# Patient Record
Sex: Male | Born: 1965 | Race: White | Hispanic: No | Marital: Married | Smoking: Current every day smoker
Health system: Southern US, Community
[De-identification: ages and names within clinical notes are randomized; demographics above are authoritative.]

## PROBLEM LIST (undated history)

## (undated) DIAGNOSIS — I480 Paroxysmal atrial fibrillation: Secondary | ICD-10-CM

## (undated) DIAGNOSIS — J45909 Unspecified asthma, uncomplicated: Secondary | ICD-10-CM

## (undated) DIAGNOSIS — K219 Gastro-esophageal reflux disease without esophagitis: Secondary | ICD-10-CM

## (undated) DIAGNOSIS — K529 Noninfective gastroenteritis and colitis, unspecified: Secondary | ICD-10-CM

## (undated) DIAGNOSIS — K760 Fatty (change of) liver, not elsewhere classified: Secondary | ICD-10-CM

## (undated) DIAGNOSIS — M1990 Unspecified osteoarthritis, unspecified site: Secondary | ICD-10-CM

## (undated) DIAGNOSIS — F410 Panic disorder [episodic paroxysmal anxiety] without agoraphobia: Secondary | ICD-10-CM

## (undated) DIAGNOSIS — I2510 Atherosclerotic heart disease of native coronary artery without angina pectoris: Secondary | ICD-10-CM

## (undated) DIAGNOSIS — I499 Cardiac arrhythmia, unspecified: Secondary | ICD-10-CM

## (undated) DIAGNOSIS — R7303 Prediabetes: Secondary | ICD-10-CM

## (undated) DIAGNOSIS — I10 Essential (primary) hypertension: Secondary | ICD-10-CM

## (undated) DIAGNOSIS — F32A Depression, unspecified: Secondary | ICD-10-CM

## (undated) DIAGNOSIS — R918 Other nonspecific abnormal finding of lung field: Secondary | ICD-10-CM

## (undated) DIAGNOSIS — E663 Overweight: Secondary | ICD-10-CM

## (undated) DIAGNOSIS — I639 Cerebral infarction, unspecified: Secondary | ICD-10-CM

## (undated) DIAGNOSIS — G459 Transient cerebral ischemic attack, unspecified: Secondary | ICD-10-CM

## (undated) DIAGNOSIS — Z8719 Personal history of other diseases of the digestive system: Secondary | ICD-10-CM

## (undated) DIAGNOSIS — J449 Chronic obstructive pulmonary disease, unspecified: Secondary | ICD-10-CM

## (undated) DIAGNOSIS — F329 Major depressive disorder, single episode, unspecified: Secondary | ICD-10-CM

## (undated) DIAGNOSIS — Z720 Tobacco use: Secondary | ICD-10-CM

## (undated) DIAGNOSIS — E785 Hyperlipidemia, unspecified: Secondary | ICD-10-CM

## (undated) DIAGNOSIS — E119 Type 2 diabetes mellitus without complications: Secondary | ICD-10-CM

## (undated) HISTORY — DX: Other nonspecific abnormal finding of lung field: R91.8

## (undated) HISTORY — DX: Paroxysmal atrial fibrillation: I48.0

## (undated) HISTORY — DX: Depression, unspecified: F32.A

## (undated) HISTORY — DX: Transient cerebral ischemic attack, unspecified: G45.9

## (undated) HISTORY — DX: Tobacco use: Z72.0

## (undated) HISTORY — DX: Fatty (change of) liver, not elsewhere classified: K76.0

## (undated) HISTORY — DX: Major depressive disorder, single episode, unspecified: F32.9

## (undated) HISTORY — DX: Atherosclerotic heart disease of native coronary artery without angina pectoris: I25.10

## (undated) HISTORY — DX: Chronic obstructive pulmonary disease, unspecified: J44.9

## (undated) HISTORY — DX: Overweight: E66.3

## (undated) HISTORY — DX: Noninfective gastroenteritis and colitis, unspecified: K52.9

## (undated) HISTORY — DX: Hyperlipidemia, unspecified: E78.5

## (undated) HISTORY — DX: Gastro-esophageal reflux disease without esophagitis: K21.9

## (undated) HISTORY — DX: Prediabetes: R73.03

## (undated) HISTORY — DX: Essential (primary) hypertension: I10

## (undated) HISTORY — PX: NECK MASS EXCISION: SHX2079

---

## 1988-07-17 DIAGNOSIS — K529 Noninfective gastroenteritis and colitis, unspecified: Secondary | ICD-10-CM

## 1988-07-17 HISTORY — DX: Noninfective gastroenteritis and colitis, unspecified: K52.9

## 1988-07-17 HISTORY — PX: COLONOSCOPY: SHX174

## 1999-10-11 ENCOUNTER — Inpatient Hospital Stay (HOSPITAL_COMMUNITY): Admission: EM | Admit: 1999-10-11 | Discharge: 1999-10-13 | Payer: Self-pay | Admitting: Cardiology

## 2000-10-08 ENCOUNTER — Ambulatory Visit (HOSPITAL_COMMUNITY): Admission: RE | Admit: 2000-10-08 | Discharge: 2000-10-08 | Payer: Self-pay | Admitting: Urology

## 2000-10-08 ENCOUNTER — Encounter: Payer: Self-pay | Admitting: Urology

## 2001-04-10 ENCOUNTER — Emergency Department (HOSPITAL_COMMUNITY): Admission: EM | Admit: 2001-04-10 | Discharge: 2001-04-10 | Payer: Self-pay | Admitting: *Deleted

## 2001-09-10 ENCOUNTER — Ambulatory Visit (HOSPITAL_COMMUNITY): Admission: RE | Admit: 2001-09-10 | Discharge: 2001-09-10 | Payer: Self-pay | Admitting: Pulmonary Disease

## 2001-09-18 ENCOUNTER — Ambulatory Visit (HOSPITAL_COMMUNITY): Admission: RE | Admit: 2001-09-18 | Discharge: 2001-09-18 | Payer: Self-pay | Admitting: Pulmonary Disease

## 2001-10-02 ENCOUNTER — Ambulatory Visit (HOSPITAL_COMMUNITY): Admission: RE | Admit: 2001-10-02 | Discharge: 2001-10-02 | Payer: Self-pay | Admitting: Internal Medicine

## 2001-12-16 ENCOUNTER — Ambulatory Visit (HOSPITAL_COMMUNITY): Admission: RE | Admit: 2001-12-16 | Discharge: 2001-12-16 | Payer: Self-pay | Admitting: Pulmonary Disease

## 2002-04-05 ENCOUNTER — Encounter: Payer: Self-pay | Admitting: Emergency Medicine

## 2002-04-05 ENCOUNTER — Emergency Department (HOSPITAL_COMMUNITY): Admission: EM | Admit: 2002-04-05 | Discharge: 2002-04-06 | Payer: Self-pay | Admitting: Emergency Medicine

## 2002-05-10 ENCOUNTER — Encounter: Payer: Self-pay | Admitting: *Deleted

## 2002-05-10 ENCOUNTER — Emergency Department (HOSPITAL_COMMUNITY): Admission: EM | Admit: 2002-05-10 | Discharge: 2002-05-10 | Payer: Self-pay | Admitting: *Deleted

## 2002-07-04 ENCOUNTER — Emergency Department (HOSPITAL_COMMUNITY): Admission: EM | Admit: 2002-07-04 | Discharge: 2002-07-04 | Payer: Self-pay | Admitting: Emergency Medicine

## 2002-07-04 ENCOUNTER — Encounter: Payer: Self-pay | Admitting: Emergency Medicine

## 2002-07-30 ENCOUNTER — Ambulatory Visit (HOSPITAL_COMMUNITY): Admission: RE | Admit: 2002-07-30 | Discharge: 2002-07-30 | Payer: Self-pay | Admitting: Internal Medicine

## 2002-07-30 ENCOUNTER — Encounter (INDEPENDENT_AMBULATORY_CARE_PROVIDER_SITE_OTHER): Payer: Self-pay | Admitting: Internal Medicine

## 2002-08-20 ENCOUNTER — Encounter (INDEPENDENT_AMBULATORY_CARE_PROVIDER_SITE_OTHER): Payer: Self-pay | Admitting: Internal Medicine

## 2002-08-20 ENCOUNTER — Ambulatory Visit (HOSPITAL_COMMUNITY): Admission: RE | Admit: 2002-08-20 | Discharge: 2002-08-20 | Payer: Self-pay | Admitting: Internal Medicine

## 2002-10-01 ENCOUNTER — Encounter: Payer: Self-pay | Admitting: *Deleted

## 2002-10-01 ENCOUNTER — Encounter (HOSPITAL_COMMUNITY): Admission: RE | Admit: 2002-10-01 | Discharge: 2002-10-31 | Payer: Self-pay | Admitting: *Deleted

## 2002-12-24 ENCOUNTER — Emergency Department (HOSPITAL_COMMUNITY): Admission: EM | Admit: 2002-12-24 | Discharge: 2002-12-24 | Payer: Self-pay | Admitting: Emergency Medicine

## 2003-04-17 ENCOUNTER — Emergency Department (HOSPITAL_COMMUNITY): Admission: EM | Admit: 2003-04-17 | Discharge: 2003-04-17 | Payer: Self-pay | Admitting: Emergency Medicine

## 2003-06-16 ENCOUNTER — Emergency Department (HOSPITAL_COMMUNITY): Admission: EM | Admit: 2003-06-16 | Discharge: 2003-06-16 | Payer: Self-pay | Admitting: Emergency Medicine

## 2003-10-27 ENCOUNTER — Inpatient Hospital Stay (HOSPITAL_COMMUNITY): Admission: EM | Admit: 2003-10-27 | Discharge: 2003-10-29 | Payer: Self-pay | Admitting: *Deleted

## 2003-12-03 ENCOUNTER — Ambulatory Visit (HOSPITAL_COMMUNITY): Admission: RE | Admit: 2003-12-03 | Discharge: 2003-12-03 | Payer: Self-pay | Admitting: Internal Medicine

## 2004-02-03 ENCOUNTER — Ambulatory Visit (HOSPITAL_COMMUNITY): Admission: RE | Admit: 2004-02-03 | Discharge: 2004-02-03 | Payer: Self-pay | Admitting: Pulmonary Disease

## 2004-02-10 ENCOUNTER — Ambulatory Visit (HOSPITAL_COMMUNITY): Admission: RE | Admit: 2004-02-10 | Discharge: 2004-02-10 | Payer: Self-pay | Admitting: Pulmonary Disease

## 2004-03-04 ENCOUNTER — Ambulatory Visit (HOSPITAL_COMMUNITY): Admission: RE | Admit: 2004-03-04 | Discharge: 2004-03-04 | Payer: Self-pay | Admitting: Pulmonary Disease

## 2004-04-13 ENCOUNTER — Ambulatory Visit (HOSPITAL_COMMUNITY): Admission: RE | Admit: 2004-04-13 | Discharge: 2004-04-13 | Payer: Self-pay | Admitting: Pulmonary Disease

## 2004-06-23 ENCOUNTER — Emergency Department (HOSPITAL_COMMUNITY): Admission: EM | Admit: 2004-06-23 | Discharge: 2004-06-23 | Payer: Self-pay | Admitting: Emergency Medicine

## 2004-07-11 ENCOUNTER — Emergency Department (HOSPITAL_COMMUNITY): Admission: EM | Admit: 2004-07-11 | Discharge: 2004-07-11 | Payer: Self-pay | Admitting: Emergency Medicine

## 2004-07-17 HISTORY — PX: SHOULDER ARTHROSCOPY W/ ROTATOR CUFF REPAIR: SHX2400

## 2004-07-28 ENCOUNTER — Emergency Department (HOSPITAL_COMMUNITY): Admission: EM | Admit: 2004-07-28 | Discharge: 2004-07-28 | Payer: Self-pay | Admitting: *Deleted

## 2005-05-12 ENCOUNTER — Ambulatory Visit (HOSPITAL_COMMUNITY): Admission: RE | Admit: 2005-05-12 | Discharge: 2005-05-12 | Payer: Self-pay | Admitting: Pulmonary Disease

## 2006-02-26 ENCOUNTER — Ambulatory Visit: Payer: Self-pay | Admitting: Cardiology

## 2006-05-16 ENCOUNTER — Ambulatory Visit (HOSPITAL_COMMUNITY): Admission: RE | Admit: 2006-05-16 | Discharge: 2006-05-16 | Payer: Self-pay | Admitting: Orthopedic Surgery

## 2006-06-19 ENCOUNTER — Ambulatory Visit: Payer: Self-pay | Admitting: Internal Medicine

## 2006-11-11 ENCOUNTER — Ambulatory Visit: Payer: Self-pay | Admitting: Internal Medicine

## 2006-11-11 ENCOUNTER — Inpatient Hospital Stay (HOSPITAL_COMMUNITY): Admission: EM | Admit: 2006-11-11 | Discharge: 2006-11-12 | Payer: Self-pay | Admitting: Emergency Medicine

## 2006-11-12 ENCOUNTER — Ambulatory Visit: Payer: Self-pay | Admitting: Internal Medicine

## 2006-12-19 ENCOUNTER — Ambulatory Visit: Payer: Self-pay | Admitting: Internal Medicine

## 2007-03-08 ENCOUNTER — Ambulatory Visit: Payer: Self-pay | Admitting: Cardiology

## 2007-12-02 ENCOUNTER — Ambulatory Visit: Payer: Self-pay | Admitting: Gastroenterology

## 2007-12-29 ENCOUNTER — Emergency Department (HOSPITAL_COMMUNITY): Admission: EM | Admit: 2007-12-29 | Discharge: 2007-12-29 | Payer: Self-pay | Admitting: Emergency Medicine

## 2008-01-26 ENCOUNTER — Ambulatory Visit: Payer: Self-pay | Admitting: Cardiology

## 2008-01-26 ENCOUNTER — Inpatient Hospital Stay (HOSPITAL_COMMUNITY): Admission: AD | Admit: 2008-01-26 | Discharge: 2008-01-27 | Payer: Self-pay | Admitting: Cardiology

## 2008-01-26 ENCOUNTER — Encounter: Payer: Self-pay | Admitting: Emergency Medicine

## 2008-02-14 ENCOUNTER — Ambulatory Visit: Payer: Self-pay | Admitting: Cardiology

## 2008-02-14 ENCOUNTER — Encounter (HOSPITAL_COMMUNITY): Admission: RE | Admit: 2008-02-14 | Discharge: 2008-03-15 | Payer: Self-pay | Admitting: Cardiology

## 2008-03-11 ENCOUNTER — Ambulatory Visit: Payer: Self-pay | Admitting: Cardiology

## 2008-05-04 ENCOUNTER — Ambulatory Visit: Payer: Self-pay | Admitting: Gastroenterology

## 2008-06-23 ENCOUNTER — Ambulatory Visit: Payer: Self-pay | Admitting: Gastroenterology

## 2008-07-01 ENCOUNTER — Emergency Department (HOSPITAL_COMMUNITY): Admission: EM | Admit: 2008-07-01 | Discharge: 2008-07-02 | Payer: Self-pay | Admitting: Emergency Medicine

## 2008-10-27 ENCOUNTER — Ambulatory Visit: Payer: Self-pay | Admitting: Cardiology

## 2008-11-16 ENCOUNTER — Emergency Department (HOSPITAL_COMMUNITY): Admission: EM | Admit: 2008-11-16 | Discharge: 2008-11-16 | Payer: Self-pay | Admitting: Emergency Medicine

## 2009-01-22 ENCOUNTER — Telehealth (INDEPENDENT_AMBULATORY_CARE_PROVIDER_SITE_OTHER): Payer: Self-pay

## 2009-03-03 ENCOUNTER — Encounter: Payer: Self-pay | Admitting: Gastroenterology

## 2009-04-05 DIAGNOSIS — E669 Obesity, unspecified: Secondary | ICD-10-CM

## 2009-04-05 DIAGNOSIS — K760 Fatty (change of) liver, not elsewhere classified: Secondary | ICD-10-CM

## 2009-04-05 DIAGNOSIS — K219 Gastro-esophageal reflux disease without esophagitis: Secondary | ICD-10-CM | POA: Insufficient documentation

## 2009-04-26 ENCOUNTER — Encounter (INDEPENDENT_AMBULATORY_CARE_PROVIDER_SITE_OTHER): Payer: Self-pay | Admitting: *Deleted

## 2009-04-26 ENCOUNTER — Telehealth: Payer: Self-pay | Admitting: Cardiology

## 2009-05-20 ENCOUNTER — Ambulatory Visit: Payer: Self-pay | Admitting: Gastroenterology

## 2009-05-27 ENCOUNTER — Encounter: Payer: Self-pay | Admitting: Internal Medicine

## 2009-05-27 ENCOUNTER — Encounter: Payer: Self-pay | Admitting: Gastroenterology

## 2009-06-01 LAB — CONVERTED CEMR LAB
ALT: 52 units/L (ref 0–53)
AST: 24 units/L (ref 0–37)
Albumin: 4.2 g/dL (ref 3.5–5.2)
Alkaline Phosphatase: 54 units/L (ref 39–117)
BUN: 18 mg/dL (ref 6–23)
Basophils Absolute: 0 10*3/uL (ref 0.0–0.1)
Basophils Relative: 0 % (ref 0–1)
CO2: 22 meq/L (ref 19–32)
Calcium: 9.4 mg/dL (ref 8.4–10.5)
Chloride: 102 meq/L (ref 96–112)
Creatinine, Ser: 1.01 mg/dL (ref 0.40–1.50)
Eosinophils Absolute: 0.1 10*3/uL (ref 0.0–0.7)
Eosinophils Relative: 2 % (ref 0–5)
Glucose, Bld: 188 mg/dL — ABNORMAL HIGH (ref 70–99)
HCT: 48.2 % (ref 39.0–52.0)
Hemoglobin: 16.2 g/dL (ref 13.0–17.0)
Lymphocytes Relative: 34 % (ref 12–46)
Lymphs Abs: 2.3 10*3/uL (ref 0.7–4.0)
MCHC: 33.6 g/dL (ref 30.0–36.0)
MCV: 92 fL (ref 78.0–100.0)
Monocytes Absolute: 0.5 10*3/uL (ref 0.1–1.0)
Monocytes Relative: 7 % (ref 3–12)
Neutro Abs: 3.9 10*3/uL (ref 1.7–7.7)
Neutrophils Relative %: 57 % (ref 43–77)
Platelets: 224 10*3/uL (ref 150–400)
Potassium: 4.4 meq/L (ref 3.5–5.3)
RBC: 5.24 M/uL (ref 4.22–5.81)
RDW: 12.9 % (ref 11.5–15.5)
Sodium: 139 meq/L (ref 135–145)
TSH: 1.402 microintl units/mL (ref 0.350–4.500)
Total Bilirubin: 0.3 mg/dL (ref 0.3–1.2)
Total Protein: 6.4 g/dL (ref 6.0–8.3)
WBC: 6.9 10*3/uL (ref 4.0–10.5)

## 2009-06-08 ENCOUNTER — Ambulatory Visit (HOSPITAL_COMMUNITY): Admission: RE | Admit: 2009-06-08 | Discharge: 2009-06-08 | Payer: Self-pay | Admitting: Internal Medicine

## 2009-06-29 ENCOUNTER — Ambulatory Visit: Payer: Self-pay | Admitting: Internal Medicine

## 2009-06-30 ENCOUNTER — Encounter: Payer: Self-pay | Admitting: Internal Medicine

## 2009-08-04 ENCOUNTER — Encounter (HOSPITAL_COMMUNITY): Admission: RE | Admit: 2009-08-04 | Discharge: 2009-09-03 | Payer: Self-pay | Admitting: Internal Medicine

## 2009-10-18 ENCOUNTER — Ambulatory Visit: Payer: Self-pay | Admitting: Gastroenterology

## 2009-11-25 ENCOUNTER — Ambulatory Visit: Payer: Self-pay | Admitting: Cardiology

## 2009-11-25 DIAGNOSIS — E785 Hyperlipidemia, unspecified: Secondary | ICD-10-CM

## 2009-11-25 DIAGNOSIS — E782 Mixed hyperlipidemia: Secondary | ICD-10-CM | POA: Insufficient documentation

## 2009-11-26 ENCOUNTER — Encounter: Payer: Self-pay | Admitting: Adult Health

## 2009-11-29 ENCOUNTER — Encounter (INDEPENDENT_AMBULATORY_CARE_PROVIDER_SITE_OTHER): Payer: Self-pay | Admitting: *Deleted

## 2009-11-29 LAB — CONVERTED CEMR LAB
BUN: 17 mg/dL
CO2: 28 meq/L (ref 19–32)
Calcium: 9.4 mg/dL (ref 8.4–10.5)
Chloride: 101 meq/L
Creatinine, Ser: 1.05 mg/dL
Creatinine, Ser: 1.05 mg/dL (ref 0.40–1.50)
Glucose, Bld: 195 mg/dL — ABNORMAL HIGH (ref 70–99)
HDL: 36 mg/dL
Hgb A1c MFr Bld: 8.6 %
Hgb A1c MFr Bld: 8.6 % — ABNORMAL HIGH (ref <5.7)
Triglycerides: 137 mg/dL

## 2009-11-30 ENCOUNTER — Telehealth (INDEPENDENT_AMBULATORY_CARE_PROVIDER_SITE_OTHER): Payer: Self-pay

## 2010-02-25 ENCOUNTER — Encounter (INDEPENDENT_AMBULATORY_CARE_PROVIDER_SITE_OTHER): Payer: Self-pay | Admitting: *Deleted

## 2010-03-02 ENCOUNTER — Ambulatory Visit: Payer: Self-pay | Admitting: Cardiology

## 2010-04-28 ENCOUNTER — Encounter (INDEPENDENT_AMBULATORY_CARE_PROVIDER_SITE_OTHER): Payer: Self-pay | Admitting: *Deleted

## 2010-06-06 ENCOUNTER — Ambulatory Visit: Payer: Self-pay | Admitting: Cardiology

## 2010-06-06 ENCOUNTER — Encounter: Payer: Self-pay | Admitting: Adult Health

## 2010-06-14 ENCOUNTER — Encounter: Payer: Self-pay | Admitting: Adult Health

## 2010-06-14 ENCOUNTER — Ambulatory Visit: Payer: Self-pay | Admitting: Cardiology

## 2010-06-20 ENCOUNTER — Encounter (INDEPENDENT_AMBULATORY_CARE_PROVIDER_SITE_OTHER): Payer: Self-pay | Admitting: *Deleted

## 2010-06-20 LAB — CONVERTED CEMR LAB
Albumin: 4.5 g/dL
Alkaline Phosphatase: 63 units/L
BUN: 16 mg/dL
CO2: 32 meq/L
Calcium: 9.3 mg/dL
Chloride: 99 meq/L
Glucose, Bld: 198 mg/dL
Potassium: 4.4 meq/L
Total Protein: 6.7 g/dL
Triglycerides: 137 mg/dL

## 2010-06-21 LAB — CONVERTED CEMR LAB
ALT: 67 units/L — ABNORMAL HIGH (ref 0–53)
AST: 43 units/L — ABNORMAL HIGH (ref 0–37)
BUN: 16 mg/dL (ref 6–23)
Calcium: 9.3 mg/dL (ref 8.4–10.5)
Cholesterol: 175 mg/dL (ref 0–200)
Indirect Bilirubin: 0.5 mg/dL (ref 0.0–0.9)
Potassium: 4.4 meq/L (ref 3.5–5.3)
Sodium: 139 meq/L (ref 135–145)
Total Protein: 6.7 g/dL (ref 6.0–8.3)
Triglycerides: 137 mg/dL (ref <150)
VLDL: 27 mg/dL (ref 0–40)

## 2010-07-06 ENCOUNTER — Encounter: Payer: Self-pay | Admitting: Adult Health

## 2010-07-06 ENCOUNTER — Ambulatory Visit: Payer: Self-pay | Admitting: Cardiology

## 2010-07-13 ENCOUNTER — Telehealth (INDEPENDENT_AMBULATORY_CARE_PROVIDER_SITE_OTHER): Payer: Self-pay

## 2010-08-04 ENCOUNTER — Encounter (INDEPENDENT_AMBULATORY_CARE_PROVIDER_SITE_OTHER): Payer: Self-pay | Admitting: *Deleted

## 2010-08-06 ENCOUNTER — Encounter: Payer: Self-pay | Admitting: Pulmonary Disease

## 2010-08-07 ENCOUNTER — Encounter: Payer: Self-pay | Admitting: Internal Medicine

## 2010-08-08 ENCOUNTER — Encounter: Payer: Self-pay | Admitting: Cardiology

## 2010-08-18 NOTE — Assessment & Plan Note (Addendum)
Summary: GERD   Visit Type:  Follow-up Visit Primary Care Provider:  Juanetta Gosling, M.D.  Chief Complaint:  follow up- gerd.  History of Present Illness: Seen & eva;uated in 2003 for RUQ ABD PAIN. dX-GERD/IBS, 207 LBS, ABD U/S:CT ABD/PELVIS-fatty liver, EGD/eso bx: EROSIVE ESOPHAGITIS, HPYLORI SEROLOGY NEG. 2004: RUQ PAIN? MS, 203 LBS, SBFT-slightly delayed SB transit, diverticula in the cecum, CTABD/PELVIS-ABD U/S: fatty liver, 2005: RUQ PAIN & GERD, 218 LBS, NL HFP, ABD U/S-FATTY LIVER, no gallstones, rX: NEXIUM two times a day, 2007: 193 LBS, 2008: 217 LBS: CHEST PAIN, rX: NEXIUM. 2009: 225-233 LBS, ALT 59, AST 27, ALB 3.8  2010: Seen and evaluated by [facility] for RUQ pain. ABD  US/CT/HIDA showed no gallstones or cholecystitis. Pain was having has eased up. Can feel a flutter is his esophagus. Feels like  needs to cough and can't breath. Still smoking but not as much. weight unchanged since NOV 2009. Got a water bed elevated but he has a block is 3 inches. Drinking Mt. Dew-1 case a day, then 4 ls a day. Now Caffeine free Mt. Dew 1L a day.   Current Medications (verified): 1)  Pravastatin Sodium 20 Mg Tabs (Pravastatin Sodium) .... Take One Tablet By Mouth Daily At Bedtime 2)  Metoprolol Tartrate 75mg  Tabs (Metoprolol Tartrate) .... Take 1 Tablet By Mouth Once A Day 3)  Aspirin 325 Mg Tabs (Aspirin) .... Take 1 Tablet By Mouth Once A Day 4)  Tylenol 325 Mg Tabs (Acetaminophen) .... Take As Needed 5)  Nexium 40 Mg Cpdr (Esomeprazole Magnesium) .... Take 1 Capsule By Mouth Two Times A Day 6)  Vicodin Es 7.5-750 Mg Tabs (Hydrocodone-Acetaminophen) .... Take As Needed 7)  Muscle Relaxer .... Take As Needed 8)  Xanax 0.5 Mg Tabs (Alprazolam) .... Take As Needed 9)  Tums 500 Mg Chew (Calcium Carbonate Antacid) .... Take As Needed  Allergies (verified): 1)  ! Pcn 2)  ! * Mushrooms  Past History:  Past Medical History: HIATAL  HERNIA COPD GERD Hyperlipidemia Hypertension Obesity DEPRESSION TOBACCO ABUSE 1990: COLITIS, FX SIG-NL COLON  Review of Systems       2009:  Negative pharmacologic stress nuclear myocardial study 2003: 207 LBS 1995: 186 LBS 1990: 166 lbs,   Vital Signs:  Patient profile:   45 year old male Height:      71 inches Weight:      235 pounds BMI:     32.89 Temp:     97.6 degrees F oral Pulse rate:   76 / minute BP sitting:   132 / 82  (left arm) Cuff size:   regular  Vitals Entered By: Hendricks Limes LPN (October 18, 1608 2:54 PM)  Physical Exam  General:  Well developed, well nourished, no acute distress. Head:  Normocephalic and atraumatic. Eyes:  PERRLA, no icterus. Mouth:  No deformity or lesions. Lungs:  Clear throughout to auscultation. Heart:  Regular rate and rhythm; no murmurs. Abdomen:  Soft, nontender and nondistended. Normal bowel sounds. obese.   Extremities:  No edema noted. Neurologic:  Alert and  oriented x4;  grossly normal neurologically.  Impression & Recommendations:  Problem # 1:  GASTROESOPHAGEAL REFLUX DISEASE (ICD-530.81) Discussed with pt and wife he needs to follow lifestyle recommendations: stop smoking, lose weight, avoid carbonated beverages, and low fat diet. Explained medicines can only do so much. Continue Nexium. Follow low fat diet.   TIME SPENT: 15 MINUTES  CC: PCP  Patient Instructions: 1)  FOLLOW A LOW FAT DIET. 2)  FOLLOW REFLUX RECOMMENDATIONS. 3)  Lose down ot 215 lbs. 4)  Exercise 3-4 times a week. Jog 30 secs four times in 1 mile. 5)  Contiue Nexium. 6)  Return visit in 6 mos. 7)  The medication list was reviewed and reconciled.  All changed / newly prescribed medications were explained.  A complete medication list was provided to the patient / caregiver.  Appended Document: Orders Update    Clinical Lists Changes  Orders: Added new Service order of Est. Patient Level III (82956) - Signed      Appended Document:  GERD    Prescriptions: NEXIUM 40 MG CPDR (ESOMEPRAZOLE MAGNESIUM) Take 1 capsule by mouth two times a day  #60 x 5   Entered and Authorized by:   Joselyn Arrow FNP-BC   Signed by:   Joselyn Arrow FNP-BC on 08/02/2010   Method used:   Electronically to        [facility]  [address] (retail)       [address]       [place], [place]  [address]       Ph: [phone]       Fax: [phone]   RxID:   [id number]  PT NEEDS OV   Appended Document: GERD mailed letter to pt to call us to set up ov to further refills

## 2010-08-18 NOTE — Assessment & Plan Note (Signed)
Summary: F1M      Allergies Added:   Visit Type:  Follow-up Primary Provider:  Juanetta Gosling, M.D.   History of Present Illness: Richard [name] is a 45 y/o CM with known history of CAD, nonobstructive per cardiac catherization 2007; follow-up stress echo 2010 negative for ischemia, diet controlled diabetes, hypercholesterolemia.  He is here for follow-up after having episode of shortness of breath while stooping at work. He also has some mild chest pressure intermittiantly.  He is a heavy Mt. Dew drinker, 12 or more a day, he continues to smoke.  He has not had to take NTG for any chest discomfort, but is noticing his energy level is worsening.  Otherwise he is without complaint.  Preventive Screening-Counseling & Management  Alcohol-Tobacco     Alcohol drinks/day: <1     Smoking Status: current     Smoking Cessation Counseling: yes     Smoke Cessation Stage: contemplative     Packs/Day: 1.5  Caffeine-Diet-Exercise     Caffeine use/day: 12 or more cans of Mt. Dew     Caffeine Counseling: decrease use of caffeine     Diet Comments: Fast food     Diet Counseling: to improve diet; diet is suboptimal     Does Patient Exercise: no     Exercise Counseling: to improve exercise regimen  Current Medications (verified): 1)  Metoprolol Succinate 50 Mg Xr24h-Tab (Metoprolol Succinate) .... Take 1 and 1/2 Tablet By Mouth Once Daily 2)  Aspirin 325 Mg Tabs (Aspirin) .... Take 1 Tablet By Mouth Once A Day 3)  Tylenol 325 Mg Tabs (Acetaminophen) .... Take As Needed 4)  Nexium 40 Mg Cpdr (Esomeprazole Magnesium) .... Take 1 Capsule By Mouth Two Times A Day 5)  Vicodin Es 7.5-750 Mg Tabs (Hydrocodone-Acetaminophen) .... Take As Needed 6)  Robaxin 500 Mg Tabs (Methocarbamol) .... Prn 7)  Xanax 0.5 Mg Tabs (Alprazolam) .... Take As Needed 8)  Tums 500 Mg Chew (Calcium Carbonate Antacid) .... Take As Needed 9)  Pravachol 40 Mg Tabs (Pravastatin Sodium) .... Take 1 Tablet By Mouth At Bedtime 10)  Lisinopril  5 Mg Tabs (Lisinopril) .... Take 1 Tabley By Mouth Once Daily 11)  Hydrochlorothiazide 25 Mg Tabs (Hydrochlorothiazide) .... Take 1/2 Tablet By Mouth Once Daily  Allergies (verified): 1)  ! Pcn 2)  ! * Mushrooms  Comments:  Nurse/Medical Assistant: patient and i reviewed med list from previous ov and stated all meds are correct patients pharmacy is [facility] [place]  Social History: Packs/Day:  1.5 Alcohol drinks/day:  <1 Caffeine use/day:  12 or more cans of Mt. Dew Diet Comments:  Fast food Does Patient Exercise:  no  Review of Systems       All other systems have been reviewed and are negative unless stated above.   Vital Signs:  Patient profile:   45 year old male Weight:      220 pounds O2 Sat:      96 % on Room air Pulse rate:   66 / minute BP sitting:   147 / 82  (right arm)  Vitals Entered By: Dreama Saa, CNA (July 06, 2010 11:01 AM)  O2 Flow:  Room air  Physical Exam  General:  Well developed, well nourished, in no acute distress. Mouth:  poor dentition.   Lungs:  Mild crackels.  Smells of cigarette smoke. Heart:  Non-displaced PMI, chest non-tender; regular rate and rhythm, S1, S2 without murmurs, rubs or gallops. Carotid upstroke normal, no bruit. Normal abdominal aortic size,  no bruits. Femorals normal pulses, no bruits. Pedals normal pulses. No edema, no varicosities. Abdomen:  Central obesity Normal bowel sounds Msk:  Back normal, normal gait. Muscle strength and tone normal. Pulses:  pulses normal in all 4 extremities Extremities:  No clubbing or cyanosis. Neurologic:  Alert and oriented x 3. Psych:  Normal affect.   Impression & Recommendations:  Problem # 1:  CORONARY ATHEROSCLEROSIS NATIVE CORONARY ARTERY (ICD-414.01) He is generally without complaints.  I believe his breathing issue when he is stooping is related to central obesity and continued smoking.  Once he stands he feels better.  I have advised wt loss and increased exercise to  walk 30 minutes every day.  He states that when he was at 165lbs he did not have these problems and agrees to adhere to my suggestions. His updated medication list for this problem includes:    Metoprolol Succinate 50 Mg Xr24h-tab (Metoprolol succinate) .[name]... Take 1 and 1/2 tablet by mouth once daily    Aspirin 325 Mg Tabs (Aspirin) .[name]... Take 1 tablet by mouth once a day    Lisinopril 5 Mg Tabs (Lisinopril) .[name]... Take 1 tabley by mouth once daily  Problem # 2:  HYPERLIPIDEMIA (ICD-272.4) Reivew of his latest labs dated 06/20/2010 TC 175;TG-137; HDL 37; LDL 111.  He is to continue on pravachol.  Low choesterol diet is provided for him. His updated medication list for this problem includes:    Pravachol 40 Mg Tabs (Pravastatin sodium) .[name]... Take 1 tablet by mouth at bedtime  Problem # 3:  WEIGHT GAIN (ICD-783.1) He is drinking a lot of sugary Mt Dews a day- 12 or more.  I have advised that this should be drastically cut down.  I am concerned about metabolic syndrome and have advised him to follow up with his primary physician for diabetic evaluation.  He has a history of hyperglycemia in the past, but is on no medications at this time.  Fasting blood glucose is 198 on recent labs.  Problem # 4:  NICOTINE ADDICTION (ICD-305.1) He is advised to cut down and eventually quit.  He feels wt lose, and decreasing caffine will be enough and it will be too difficult to add smoking cessation to his list. He is willing to try to cut down.  Patient Instructions: 1)  Your physician recommends that you schedule a follow-up appointment in: 6 months 2)  Your physician recommends that you continue on your current medications as directed. Please refer to the Current Medication list given to you today. 3)  ***Stop drinking Mt. Dew*** 4)  Your physician discussed the importance of regular exercise and recommended that you start or continue a regular exercise program for good health. ***Please attempt to walk .  per day once daily*** 5)  ***Start a low cholesterol diet, see handout that was given to you today.

## 2010-08-18 NOTE — Letter (Signed)
Summary: Recall Office Visit  [facility]  [address]   [place], [place] [address]   Phone: [phone]  Fax: [phone]      August 04, 2010   Richard [name] [address] [place], [place]  [address] 1966/05/21   Dear [name],   According to our records, it is time for you to schedule a follow-up office visit with [name].   At your convenience, please call [phone] to schedule an office visit. If you have any questions, concerns, or feel that this letter is in error, we would appreciate your call.   Sincerely,    Diana Eves  [facility] Ph: [phone]   Fax: [phone]

## 2010-08-18 NOTE — Letter (Signed)
Summary: BP LOG  BP LOG   Imported By: Faythe Ghee 06/14/2010 14:03:40  _____________________________________________________________________  External Attachment:    Type:   Image     Comment:   External Document

## 2010-08-18 NOTE — Assessment & Plan Note (Signed)
Summary: f63m      Allergies Added:   Visit Type:  Follow-up Primary Gleen Ripberger:  Juanetta Gosling, M.D.  CC:  SOB IN HEAT.  History of Present Illness: Mr. Gwinner is here for 6 months follow-up with known history of hypertension, chronic back pain, hypercholesterolemia, and ongoing tobacco abuse.  He had a negative stress myoview in 08/09.  He denies complaints of recurrent chest pain, but continues chronic back pain.  He continues to smoke, but is trying to cut down. No new problems on this visit.  Preventive Screening-Counseling & Management  Alcohol-Tobacco     Smoking Status: current     Smoking Cessation Counseling: yes     Smoke Cessation Stage: precontemplative     Packs/Day: 0.75  Current Medications (verified): 1)  Metoprolol Succinate 50 Mg Xr24h-Tab (Metoprolol Succinate) .... Take 1 and 1/2 Tablet By Mouth Once Daily 2)  Aspirin 325 Mg Tabs (Aspirin) .... Take 1 Tablet By Mouth Once A Day 3)  Tylenol 325 Mg Tabs (Acetaminophen) .... Take As Needed 4)  Nexium 40 Mg Cpdr (Esomeprazole Magnesium) .... Take 1 Capsule By Mouth Two Times A Day 5)  Vicodin Es 7.5-750 Mg Tabs (Hydrocodone-Acetaminophen) .... Take As Needed 6)  Robaxin 500 Mg Tabs (Methocarbamol) .... Prn 7)  Xanax 0.5 Mg Tabs (Alprazolam) .... Take As Needed 8)  Tums 500 Mg Chew (Calcium Carbonate Antacid) .... Take As Needed 9)  Pravachol 20 Mg Tabs (Pravastatin Sodium) .... Take 1 Tab By Mouth Once Daily  Allergies (verified): 1)  ! Pcn 2)  ! * Mushrooms  Past History:  Past medical, surgical, family and social histories (including risk factors) reviewed, and no changes noted (except as noted below).  Past Medical History: Reviewed history from 10/18/2009 and no changes required. HIATAL HERNIA COPD GERD Hyperlipidemia Hypertension Obesity DEPRESSION TOBACCO ABUSE 1990: COLITIS, FX SIG-NL COLON  Past Surgical History: Reviewed history from 06/29/2009 and no changes required. Right shoulder  surgery Heart catherization  2005 ? surgery on knot on neck as a child  Family History: Reviewed history from 06/29/2009 and no changes required. Father: alive- heart, dm Mother: deceased Siblings: 5 brothers, 2 sisters No FH of Colon Cancer:  Social History: Reviewed history from 06/29/2009 and no changes required. Marital Status: Married Children: 5 Occupation: disability Patient currently smokes.  Alcohol Use - no Packs/Day:  0.75  Review of Systems       Chronic back pain  All other systems have been reviewed and are negative unless stated above.   Vital Signs:  Patient profile:   45 year old male Weight:      227 pounds Pulse rate:   69 / minute BP sitting:   144 / 80  (right arm)  Vitals Entered By: Dreama Saa, CNA (March 02, 2010 2:49 PM)  Physical Exam  General:  Well developed, well nourished, in no acute distress. Lungs:  Clear bilaterally to auscultation and percussion. Heart:  Non-displaced PMI, chest non-tender; regular rate and rhythm, S1, S2 without murmurs, rubs or gallops. Carotid upstroke normal, no bruit. Normal abdominal aortic size, no bruits. Femorals normal pulses, no bruits. Pedals normal pulses. No edema, no varicosities. Abdomen:  Bowel sounds positive; abdomen soft and non-tender without masses, organomegaly, or hernias noted. No hepatosplenomegaly. Msk:  Back pain with movement Pulses:  pulses normal in all 4 extremities Extremities:  No clubbing or cyanosis. Psych:  Normal affect.   Impression & Recommendations:  Problem # 1:  CORONARY ATHEROSCLEROSIS NATIVE CORONARY ARTERY (ICD-414.01) Stable from  CV standpoint.  He is compliant with medications.  No symptoms.  Will see him in i year His updated medication list for this problem includes:    Metoprolol Succinate 50 Mg Xr24h-tab (Metoprolol succinate) .[name]... Take 1 and 1/2 tablet by mouth once daily    Aspirin 325 Mg Tabs (Aspirin) .[name]... Take 1 tablet by mouth once a day  Problem #  2:  NICOTINE ADDICTION (ICD-305.1) He has no plans to quit at this time.  His shortness of breath is related to tobacco use and I have counseled him on cessation and CVRF.  He states he is under a lot of pressure right now.  He is unemployed and money issues are weighing on him.  He says he will try to cut down. He also requests Vicodin refill. I have advised him to seek medication from primary care physician.  Patient Instructions: 1)  Your physician recommends that you schedule a follow-up appointment in: 1 year

## 2010-08-18 NOTE — Assessment & Plan Note (Signed)
Summary: nurse visit  Nurse Visit   Vital Signs:  Patient profile:   45 year old male Height:      71 inches Weight:      221 pounds O2 Sat:      98 % on Room air Temp:     97.7 degrees F oral Pulse rate:   71 / minute BP sitting:   144 / 88  (left arm)  Vitals Entered By: Teressa Lower RN (June 14, 2010 9:34 AM)  O2 Flow:  Room air  Current Medications (verified): 1)  Metoprolol Succinate 50 Mg Xr24h-Tab (Metoprolol Succinate) .... Take 1 and 1/2 Tablet By Mouth Once Daily 2)  Aspirin 325 Mg Tabs (Aspirin) .... Take 1 Tablet By Mouth Once A Day 3)  Tylenol 325 Mg Tabs (Acetaminophen) .... Take As Needed 4)  Nexium 40 Mg Cpdr (Esomeprazole Magnesium) .... Take 1 Capsule By Mouth Two Times A Day 5)  Vicodin Es 7.5-750 Mg Tabs (Hydrocodone-Acetaminophen) .... Take As Needed 6)  Robaxin 500 Mg Tabs (Methocarbamol) .... Prn 7)  Xanax 0.5 Mg Tabs (Alprazolam) .... Take As Needed 8)  Tums 500 Mg Chew (Calcium Carbonate Antacid) .... Take As Needed 9)  Pravachol 20 Mg Tabs (Pravastatin Sodium) .... Take 1 Tab By Mouth Once Daily 10)  Lisinopril 5 Mg Tabs (Lisinopril) .... Take 1 Tabley By Mouth Once Daily 11)  Hydrochlorothiazide 25 Mg Tabs (Hydrochlorothiazide) .... Take 1/2 Tablet By Mouth Once Daily  Allergies (verified): 1)  ! Pcn 2)  ! * Mushrooms  Visit Type:  1 week nurse visit Primary Provider:  Juanetta Gosling, M.D.   History of Present Illness: S: 1 week nurse visit B:office visit on 06/06/2010, pt was to start lisinopril 5 mg daily, HCTZ 12.5 mg daily A: pt c/o continued palp, ekg performed scanned into record, NSR w/o palp, bp diary returned and scanned into record also, ***please note pt did not start his lisinopril***he did start his HCTZ, stated he was having palpitations but non showed on ekg  R: asked pt to begin his lisinopril today and  call back in  a week with his bp readings  Thankyou for asking about lisinopril and recommendations.  Agree with follow  up.  Joni Reining NP

## 2010-08-18 NOTE — Miscellaneous (Signed)
Summary: LABS BMP,LIPIDS,A1C,11/29/2009  Clinical Lists Changes  Observations: Added new observation of CALCIUM: 9.4 mg/dL (57/84/6962 9:52) Added new observation of CREATININE: 1.05 mg/dL (84/13/2440 1:02) Added new observation of BUN: 17 mg/dL (72/53/6644 0:34) Added new observation of BG RANDOM: 195 mg/dL (74/25/9563 8:75) Added new observation of CO2 PLSM/SER: 28 meq/L (11/29/2009 9:36) Added new observation of CL SERUM: 101 meq/L (11/29/2009 9:36) Added new observation of K SERUM: 4.3 meq/L (11/29/2009 9:36) Added new observation of NA: 138 meq/L (11/29/2009 9:36) Added new observation of LDL: 91 mg/dL (64/33/2951 8:84) Added new observation of HDL: 36 mg/dL (16/60/6301 6:01) Added new observation of TRIGLYC TOT: 137 mg/dL (09/32/3557 3:22) Added new observation of CHOLESTEROL: 154 mg/dL (02/54/2706 2:37) Added new observation of HGBA1C: 8.6 % (11/29/2009 9:36)

## 2010-08-18 NOTE — Assessment & Plan Note (Signed)
Summary: 1 YR F/U PER CHECKOUT ON 10/27/08/TG  Medications Added METOPROLOL SUCCINATE 50 MG XR24H-TAB (METOPROLOL SUCCINATE) take 1 and 1/2 tablet by mouth once daily ROBAXIN 500 MG TABS (METHOCARBAMOL) prn PROTONIX 40 MG TBEC (PANTOPRAZOLE SODIUM) take 1 tablet by mouth two times a day VICODIN ES 7.5-750 MG TABS (HYDROCODONE-ACETAMINOPHEN) take 1 tablet every 6 hrs. as needed PRAVACHOL 20 MG TABS (PRAVASTATIN SODIUM) take 1 tab by mouth once daily      Allergies Added:   Visit Type:  1 yr follow up Primary Provider:  Juanetta [name], M.D.   History of Present Illness: Richard [name] is here for annual follow up with known history of hypertension, chest discomfort with negative stress myoview 8/09, chronic back pain, ongoing tobacco abuse and hypercholesterolemia.  He has not been seen by primary care physician secondary to financial issues.  He comes today without cardiac complaints but needs to have medicatins refilled.  He has some back pain that occaisionally radiates into chest, but it is transient and not associated with acitiviy.  He unfortunately continues to smoke and eat unhealthy foods.  Current Medications (verified): 1)  Metoprolol Succinate 50 Mg Xr24h-Tab (Metoprolol Succinate) .... Take 1 and 1/2 Tablet By Mouth Once Daily 2)  Aspirin 325 Mg Tabs (Aspirin) .... Take 1 Tablet By Mouth Once A Day 3)  Tylenol 325 Mg Tabs (Acetaminophen) .... Take As Needed 4)  Nexium 40 Mg Cpdr (Esomeprazole Magnesium) .... Take 1 Capsule By Mouth Two Times A Day 5)  Vicodin Es 7.5-750 Mg Tabs (Hydrocodone-Acetaminophen) .... Take As Needed 6)  Robaxin 500 Mg Tabs (Methocarbamol) .... Prn 7)  Xanax 0.5 Mg Tabs (Alprazolam) .... Take As Needed 8)  Tums 500 Mg Chew (Calcium Carbonate Antacid) .... Take As Needed 9)  Vicodin Es 7.5-750 Mg Tabs (Hydrocodone-Acetaminophen) .... Take 1 Tablet Every 6 Hrs. As Needed 10)  Pravachol 20 Mg Tabs (Pravastatin Sodium) .... Take 1 Tab By Mouth Once Daily  Allergies  (verified): 1)  ! Pcn 2)  ! * Mushrooms PMH-FH-SH reviewed-no changes except otherwise noted  Review of Systems       Back pain.  All other systems have been reviewed and are negative unless stated above.   Vital Signs:  Patient profile:   45 year old male Height:      71 inches Weight:      231 pounds O2 Sat:      96 % on Room air Pulse rate:   57 / minute BP sitting:   132 / 79  (left arm)  Vitals Entered By: Dreama Saa, CNA (Nov 25, 2009 2:44 PM)  O2 Flow:  Room air  Physical Exam  General:  Well developed, well nourished, in no acute distress. Lungs:  Clear bilaterally to auscultation and percussion. Heart:  Non-displaced PMI, chest non-tender; regular rate and rhythm, S1, S2 without murmurs, rubs or gallops. Carotid upstroke normal, no bruit. Normal abdominal aortic size, no bruits. Femorals normal pulses, no bruits. Pedals normal pulses. No edema, no varicosities. Abdomen:  Obese 2+ BS Msk:  joint tenderness cervical spine and scoliosis to R.   Pulses:  pulses normal in all 4 extremities Extremities:  No clubbing or cyanosis. Neurologic:  Alert and oriented x 3. Psych:  anxious.     Impression & Recommendations:  Problem # 1:  CORONARY ATHEROSCLEROSIS NATIVE CORONARY ARTERY (ICD-414.01) I have refilled his metoprolol and changed his nexium to protonix.  I have given him sampels of Crestor 5mg  daily.  He is advised to  quit smoking. He is advised to follow-up with Dr. Juanetta [name] and work out a payment plan etc to continue medical mgt, I have given 30 vicodin but WITHOUT refills.  No intention of continuing to prescribe this.   Updated medication list for this problem includes:    Metoprolol Succinate 50 Mg Xr24h-tab (Metoprolol succinate) .[name]... Take 1 and 1/2 tablet by mouth once daily    Aspirin 325 Mg Tabs (Aspirin) .[name]... Take 1 tablet by mouth once a day  Future Orders: T-Basic Metabolic Panel [id number]) ... 11/29/2009  Problem # 2:  HYPERLIPIDEMIA  (ICD-272.4) Repeat labs to assess for need to change doses. Smoking cessation and better eathing habits are discussed. The following medications were removed from the medication list:    Pravastatin Sodium 20 Mg Tabs (Pravastatin sodium) .[name]... Take one tablet by mouth daily at bedtime His updated medication list for this problem includes:    Pravachol 20 Mg Tabs (Pravastatin sodium) .[name]... Take 1 tab by mouth once daily  Future Orders: T-Lipid Profile ([id number]) ... 11/29/2009 T-Basic Metabolic Panel [id number]) ... 11/29/2009  Other Orders: Future Orders: T-Hgb A1C ([id number]) ... 11/29/2009  Patient Instructions: 1)  Your physician recommends that you schedule a follow-up appointment in: 3 months 2)  Your physician recommends that you return for lab work in: Monday (11-29-09) 3)  Your physician has recommended you make the following change in your medication: Start taking Metoprolol Succ. 75mg  by mouth once daily and Vicodin 7.5/750mg  every 6 hours as needed  Prescriptions: VICODIN ES 7.5-750 MG TABS (HYDROCODONE-ACETAMINOPHEN) take 1 tablet every 6 hrs. as needed  #30 x 0   Entered by:   Larita Fife Via LPN   Authorized by:   Joni Reining, NP   Signed by:   Larita Fife Via LPN on 10/93/2355   Method used:   Print then Give to Patient   RxID:   [id number] METOPROLOL SUCCINATE 50 MG XR24H-TAB (METOPROLOL SUCCINATE) take 1 and 1/2 tablet by mouth once daily  #45 x 3   Entered by:   Larita Fife Via LPN   Authorized by:   Joni Reining, NP   Signed by:   Larita Fife Via LPN on 28/31/5176   Method used:   Electronically to        [facility]  [address] (retail)       [address]       [place], [place]  [address]       Ph: [phone]       Fax: [phone]   RxID:   [id number]

## 2010-08-18 NOTE — Progress Notes (Signed)
Summary: LAB RESULTS   Phone Note Call from Patient Call back at [phone]   Caller: PT WIFE Reason for Call: Lab or Test Results Summary of Call: S: PT WIFE CALLING FOR LAB WORK DONE MONDAY. Initial call taken by: Faythe Ghee,  Nov 30, 2009 4:50 PM  Follow-up for Phone Call        [facility]. Follow-up by: Larita Fife Via LPN,  Dec 01, 2009 10:42 AM  Additional Follow-up for Phone Call Additional follow up Details #1::        Pt's wife given lab results, she states she understands.  Additional Follow-up by: Larita Fife Via LPN,  Dec 01, 2009 11:35 AM

## 2010-08-18 NOTE — Letter (Signed)
Summary: [place] [facility], [facility] at [employer]  [address] [place] [address]   Phone: [phone]  Fax: [phone]     June 06, 2010 MRN: [id number]   Richard [name] [address] [place], [place]  [address]      YOUR LAB WORK IS DUE  June 20, 2010 _________________________________________  Please go to [facility], located across the street from [facility] on the second floor.  Hours are Monday - Friday 7am until 7:30pm         Saturday 8am until 12noon    _X_  DO NOT EAT OR DRINK AFTER MIDNIGHT EVENING PRIOR TO LABWORK  __ YOUR LABWORK IS NOT FASTING --YOU MAY EAT PRIOR TO LABWORK

## 2010-08-18 NOTE — Assessment & Plan Note (Signed)
Summary: ROV  Medications Added LISINOPRIL 5 MG TABS (LISINOPRIL) take 1 tabley by mouth once daily HYDROCHLOROTHIAZIDE 25 MG TABS (HYDROCHLOROTHIAZIDE) take 1/2 tablet by mouth once daily      Allergies Added:   Visit Type:  Follow-up Primary Provider:  Juanetta Gosling, M.D.   History of Present Illness: [name] is a 45 y/o with known history of CAD per cardiac cath in 2007 which is non obstructive, stress echo in 2010 which was normal, daibetes, hypercholesterolemia, and chronic back pain.  He comes today with complaints of chest discomfort when he was chopping wood and is easily tired with exertion.  He says that he feels pressure in his chest, not pain with heavy exertion i.e. helping tear down a mobile home.  He says the discomfort goes away with rest.  No need to take NTG. He unfortunately continues to smoke.  Current Medications (verified): 1)  Metoprolol Succinate 50 Mg Xr24h-Tab (Metoprolol Succinate) .... Take 1 and 1/2 Tablet By Mouth Once Daily 2)  Aspirin 325 Mg Tabs (Aspirin) .... Take 1 Tablet By Mouth Once A Day 3)  Tylenol 325 Mg Tabs (Acetaminophen) .... Take As Needed 4)  Nexium 40 Mg Cpdr (Esomeprazole Magnesium) .... Take 1 Capsule By Mouth Two Times A Day 5)  Vicodin Es 7.5-750 Mg Tabs (Hydrocodone-Acetaminophen) .... Take As Needed 6)  Robaxin 500 Mg Tabs (Methocarbamol) .... Prn 7)  Xanax 0.5 Mg Tabs (Alprazolam) .... Take As Needed 8)  Tums 500 Mg Chew (Calcium Carbonate Antacid) .... Take As Needed 9)  Pravachol 20 Mg Tabs (Pravastatin Sodium) .... Take 1 Tab By Mouth Once Daily 10)  Lisinopril 5 Mg Tabs (Lisinopril) .... Take 1 Tabley By Mouth Once Daily 11)  Hydrochlorothiazide 25 Mg Tabs (Hydrochlorothiazide) .... Take 1/2 Tablet By Mouth Once Daily  Allergies (verified): 1)  ! Pcn 2)  ! * Mushrooms  Comments:  Nurse/Medical Assistant: reviewed meds with patients wife and she stated that all meds are correct  no list no meds [facility] [place] is  patients pharmacy  Review of Systems       Chest pressure and fatigue.  Vital Signs:  Patient profile:   45 year old male Weight:      224 pounds O2 Sat:      97 % on Room air Pulse rate:   64 / minute BP sitting:   140 / 85  (right arm)  Vitals Entered By: Dreama Saa, CNA (June 06, 2010 11:43 AM)  O2 Flow:  Room air  Physical Exam  General:  Well developed, well nourished, in no acute distress. Lungs:  Clear bilaterally to auscultation and percussion. Heart:  Non-displaced PMI, chest non-tender; regular rate and rhythm, S1, S2 without murmurs, rubs or gallops. Carotid upstroke normal, no bruit. Normal abdominal aortic size, no bruits. Femorals normal pulses, no bruits. Pedals normal pulses. No edema, no varicosities. Abdomen:  Bowel sounds positive; abdomen soft and non-tender without masses, organomegaly, or hernias noted. No hepatosplenomegaly. Msk:  Back normal, normal gait. Muscle strength and tone normal. Pulses:  pulses normal in all 4 extremities Extremities:  No clubbing or cyanosis. Neurologic:  Alert and oriented x 3. Psych:  Normal affect.   Impression & Recommendations:  Problem # 1:  CORONARY ATHEROSCLEROSIS NATIVE CORONARY ARTERY (ICD-414.01) Chest discomfort maybe related to BP elevation in the setting of exertion. He is elevated at rest here in the office. Stress echo is reassuring.  will plan on starting lisnopril 5mg  with HCTZ 12.5mg  daily. He will follow  with a BP check in one week.  Follow -up  BMET in 2weeks. His updated medication list for this problem includes:    Metoprolol Succinate 50 Mg Xr24h-tab (Metoprolol succinate) .[name]... Take 1 and 1/2 tablet by mouth once daily    Aspirin 325 Mg Tabs (Aspirin) .[name]... Take 1 tablet by mouth once a day    Lisinopril 5 Mg Tabs (Lisinopril) .[name]... Take 1 tabley by mouth once daily  Problem # 2:  HYPERLIPIDEMIA (ICD-272.4) Lipids and LFT's will be completed on follow-up labs as above. His updated medication  list for this problem includes:    Pravachol 20 Mg Tabs (Pravastatin sodium) .[name]... Take 1 tab by mouth once daily  Future Orders: T-Hepatic Function [id number]) ... 06/20/2010 T-Lipid Profile [id number]) ... 06/20/2010  Problem # 3:  NICOTINE ADDICTION (ICD-305.1) I have again advised him to quit smoking as this is will continue to advancement of CAD, and keep BP elevated.  He verbalizes understanding.  Other Orders: Future Orders: T-Basic Metabolic Panel [id number]) ... 06/20/2010  Patient Instructions: 1)  Your physician recommends that you schedule a follow-up appointment in: 1 week for blood pressure check and in 1 month 2)  Your physician recommends that you return for lab work in: 2 weeks 3)  Your physician has recommended you make the following change in your medication: Start taking Lisinopril 5mg  by mouth once daily and Hydrochlorothiazide 12.5mg  (1/2 of 25mg  tablet) Prescriptions: HYDROCHLOROTHIAZIDE 25 MG TABS (HYDROCHLOROTHIAZIDE) take 1/2 tablet by mouth once daily  #15 x 3   Entered by:   Larita Fife Via LPN   Authorized by:   Joni Reining, NP   Signed by:   Larita Fife Via LPN on 57/84/6962   Method used:   Electronically to        [facility]  [address] (retail)       [address]       [place], [place]  [address]       Ph: [phone]       Fax: [phone]   RxID:   [id number] LISINOPRIL 5 MG TABS (LISINOPRIL) take 1 tabley by mouth once daily  #30 x 3   Entered by:   Larita Fife Via LPN   Authorized by:   Joni Reining, NP   Signed by:   Larita Fife Via LPN on 95/18/8416   Method used:   Electronically to        [facility]  [address] (retail)       [address]       [place], [place]  [address]       Ph: [phone]       Fax: [phone]   RxID:   [id number]

## 2010-08-18 NOTE — Letter (Signed)
Summary: Recall Office Visit  [facility]  [address]   [place], [place] [address]   Phone: [phone]  Fax: [phone]      April 28, 2010   Richard [name] [address] [place], [place]  [address] 18-Dec-1965   Dear [name],   According to our records, it is time for you to schedule a follow-up office visit with [name].   At your convenience, please call [phone] to schedule an office visit. If you have any questions, concerns, or feel that this letter is in error, we would appreciate your call.   Sincerely,    Diana Eves  [facility] Ph: [phone]   Fax: [phone]

## 2010-08-18 NOTE — Letter (Signed)
Summary: Handout Printed  Printed Handout:  - Diet - Low-Cholesterol Guidelines 

## 2010-08-18 NOTE — Progress Notes (Signed)
Summary: Cough Medicines   Phone Note Call from Patient Call back at [phone]   Caller: Spouse Reason for Call: Talk to Nurse Summary of Call: would like to know what over the counter cough medicines patient could take / tg Initial call taken by: Raechel Ache [facility],  July 13, 2010 2:50 PM  Follow-up for Phone Call        Pt's wife advised that pt. should take Quafenisen or Cloracedin, she expressed verbal understanding. Follow-up by: Larita Fife Via LPN,  July 13, 2010 4:15 PM

## 2010-09-13 ENCOUNTER — Encounter (INDEPENDENT_AMBULATORY_CARE_PROVIDER_SITE_OTHER): Payer: Self-pay | Admitting: *Deleted

## 2010-09-14 ENCOUNTER — Ambulatory Visit (INDEPENDENT_AMBULATORY_CARE_PROVIDER_SITE_OTHER): Payer: Medicaid Other | Admitting: Cardiology

## 2010-09-14 ENCOUNTER — Encounter: Payer: Self-pay | Admitting: Cardiology

## 2010-09-14 DIAGNOSIS — F17210 Nicotine dependence, cigarettes, uncomplicated: Secondary | ICD-10-CM | POA: Insufficient documentation

## 2010-09-14 DIAGNOSIS — J449 Chronic obstructive pulmonary disease, unspecified: Secondary | ICD-10-CM | POA: Insufficient documentation

## 2010-09-14 DIAGNOSIS — R079 Chest pain, unspecified: Secondary | ICD-10-CM

## 2010-09-19 ENCOUNTER — Encounter: Payer: Self-pay | Admitting: Cardiology

## 2010-09-22 NOTE — Miscellaneous (Signed)
Summary: labs bmp,lipids,liver,06/20/2010  Clinical Lists Changes  Observations: Added new observation of CALCIUM: 9.3 mg/dL (04/54/0981 1:91) Added new observation of ALBUMIN: 4.5 g/dL (47/82/9562 1:30) Added new observation of PROTEIN, TOT: 6.7 g/dL (86/57/8469 6:29) Added new observation of SGPT (ALT): 67 units/L (06/20/2010 8:34) Added new observation of SGOT (AST): 43 units/L (06/20/2010 8:34) Added new observation of ALK PHOS: 63 units/L (06/20/2010 8:34) Added new observation of BILI DIRECT: 0.1 mg/dL (52/84/1324 4:01) Added new observation of CREATININE: 1.03 mg/dL (02/72/5366 4:40) Added new observation of BUN: 16 mg/dL (34/74/2595 6:38) Added new observation of BG RANDOM: 198 mg/dL (75/64/3329 5:18) Added new observation of CO2 PLSM/SER: 32 meq/L (06/20/2010 8:34) Added new observation of CL SERUM: 99 meq/L (06/20/2010 8:34) Added new observation of K SERUM: 4.4 meq/L (06/20/2010 8:34) Added new observation of NA: 139 meq/L (06/20/2010 8:34) Added new observation of LDL: 111 mg/dL (84/16/6063 0:16) Added new observation of HDL: 37 mg/dL (07/25/3233 5:73) Added new observation of TRIGLYC TOT: 137 mg/dL (22/08/5425 0:62) Added new observation of CHOLESTEROL: 175 mg/dL (37/62/8315 1:76)

## 2010-09-29 ENCOUNTER — Encounter: Payer: Self-pay | Admitting: Gastroenterology

## 2010-10-03 ENCOUNTER — Ambulatory Visit (INDEPENDENT_AMBULATORY_CARE_PROVIDER_SITE_OTHER): Payer: Medicaid Other | Admitting: Gastroenterology

## 2010-10-03 ENCOUNTER — Encounter: Payer: Self-pay | Admitting: Gastroenterology

## 2010-10-03 DIAGNOSIS — E119 Type 2 diabetes mellitus without complications: Secondary | ICD-10-CM

## 2010-10-03 DIAGNOSIS — E118 Type 2 diabetes mellitus with unspecified complications: Secondary | ICD-10-CM | POA: Insufficient documentation

## 2010-10-03 DIAGNOSIS — K219 Gastro-esophageal reflux disease without esophagitis: Secondary | ICD-10-CM

## 2010-10-04 NOTE — Assessment & Plan Note (Signed)
Summary: Richard [name]  Medications Added METOPROLOL SUCCINATE 50 MG XR24H-TAB (METOPROLOL SUCCINATE) take 1 and 1/2 tablet by mouth once daily LISINOPRIL 20 MG TABS (LISINOPRIL) Take one tablet by mouth daily NICOTINE 21 MG/24HR PT24 (NICOTINE) daily NICODERM CQ 14 MG/24HR PT24 (NICOTINE) step down after 21mg  , daily x 2 weeks NICODERM CQ 7 MG/24HR PT24 (NICOTINE) after 14mg  stepdown, daily      Allergies Added:   Visit Type:  Follow-up Primary Provider:  Dr. Kari [name]   History of Present Illness: [name] returns to the office for a requested visit for recurrent chest discomfort.  He suffered an acute episode of sharp moderately severe pain at both costal margins that was somewhat pleuritic in nature.  There was no chest wall tenderness nor apparent injury.  Symptoms resolved spontaneously.  He is seen today at the insistence of his wife.  Otherwise, he has done generally well.  Current Medications (verified): 1)  Metoprolol Succinate 50 Mg Xr24h-Tab (Metoprolol Succinate) .... Take 1 and 1/2 Tablet By Mouth Once Daily 2)  Aspirin 325 Mg Tabs (Aspirin) .... Take 1 Tablet By Mouth Once A Day 3)  Tylenol 325 Mg Tabs (Acetaminophen) .... Take As Needed 4)  Nexium 40 Mg Cpdr (Esomeprazole Magnesium) .... Take 1 Capsule By Mouth Two Times A Day 5)  Vicodin Es 7.5-750 Mg Tabs (Hydrocodone-Acetaminophen) .... Take As Needed 6)  Robaxin 500 Mg Tabs (Methocarbamol) .... Prn 7)  Xanax 0.5 Mg Tabs (Alprazolam) .... Take As Needed 8)  Tums 500 Mg Chew (Calcium Carbonate Antacid) .... Take As Needed 9)  Pravachol 40 Mg Tabs (Pravastatin Sodium) .... Take 1 Tablet By Mouth At Bedtime 10)  Lisinopril 20 Mg Tabs (Lisinopril) .... Take One Tablet By Mouth Daily 11)  Nicotine 21 Mg/24hr Pt24 (Nicotine) .... Daily 12)  Nicoderm Cq 14 Mg/24hr Pt24 (Nicotine) .... Step Down After 21mg  , Daily X 2 Weeks 13)  Nicoderm Cq 7 Mg/24hr Pt24 (Nicotine) .... After 14mg  Stepdown,  Daily  Allergies (verified): 1)  ! Pcn 2)  ! * Mushrooms  Comments:  Nurse/Medical Assistant: patient didnt bring meds or list we reviewed from previous ov [facility] in [place]  Past History:  PMH, FH, and Social History reviewed and updated.  Past Medical History: Chest pain, palpitations: cath 2005- 30-40% mid LAD, 20% D1, 20% cx, OM, 20-30% RCA, and EF-55% Hyperlipidemia Hypertension Tobacco abuse Obesity Chronic obstructive pulmonary disease Gastroesophageal reflux disease- hiatal hernia Depression Colitis-1990  Past Surgical History: Right shoulder surgery Excision of mass from neck  Family History: Father: alive at age 19 in 74- h/o MI at age 5 and diabetes Mother: deceased age 3 with carcinoma of the lung Siblings: 5 brothers, one deceased as a result of trauma, one with hypertension and one with myocardial infarction at age 17;   2 sisters, one with hypertension  No FH of Colon Cancer  Social History: Marital Status: Married; 5 children; resides in [place] Occupation: disability; previously employed by [employer] Tobacco-30-40 pack years; one half pack per day Alcohol Use - no  Review of Systems  The patient denies weight loss, weight gain, hoarseness, syncope, dyspnea on exertion, peripheral edema, prolonged cough, headaches, hemoptysis, and abdominal pain.    Vital Signs:  Patient profile:   45 year old male Weight:      220 pounds BMI:     30.79 Pulse rate:   65 / minute BP sitting:   121 / 75  (left arm)  Vitals Entered By: Richard Saa, CNA (  September 14, 2010 11:15 AM)  Physical Exam  General:  Overweight; well developed; no acute distress Weight-220 pounds, 30 less than his peak but 30 greater than his nadir Neck-No JVD; no carotid bruits: Lungs-No tachypnea, no rales; no rhonchi; no wheezes: Cardiovascular-normal PMI; normal S1 and S2: Abdomen-BS normal; soft and non-tender without masses or organomegaly:  Musculoskeletal-No  deformities, no cyanosis or clubbing: Neurologic-Normal cranial nerves; symmetric strength and tone:  Skin-Warm, no significant lesions: Extremities-Nl distal pulses; no edema:     Impression & Recommendations:  Problem # 1:  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  A stress nuclear study was negative in 2009.  In the absence of recurrent symptoms, additional testing is not warranted at present.  He is encouraged to call immediately should chest discomfort recurred.  Problem # 2:  HYPERTENSION (ICD-401.1) Blood pressure control is excellent, but patient requests a simpler medical regime.  Lisinopril will be increased to 20 mg q.d. and hydrochlorothiazide discontinued.  Patient and his wife will monitor blood pressure at home and call for elevated values.  Problem # 3:  TOBACCO ABUSE (ICD-305.1) Mr. Oriley previously discontinued cigarette smoking for as long as 3 months.  He expresses an interest in doing so again.  I've recommended that he start with nicotine patches and have explained the optimal approach to quitting with the assistancet of that pharmacologic aid.  Patient Instructions: 1)  Your physician recommends that you schedule a follow-up appointment in: June 2)  Your physician has recommended you make the following change in your medication: stop hydrochlorathiazide, increase lisinorpil to 20mg  daily, call for recurrent chest discomfort 3)  stop smoking nicotine patch 21mg  patch x2 weeks, then 14mg  patch x2 weeks then 7mg  x2 weeks then discontinue 4)  Your physician discussed the hazards of tobacco use.  Tobacco use cessation is recommended and techniques and options to help you quit were discussed. Prescriptions: NICODERM CQ 7 MG/24HR PT24 (NICOTINE) after 14mg  stepdown, daily  #14 x  0   Entered by:   Richard Lower RN   Authorized by:   Richard Brunswick, MD, [facility]   Signed by:   Richard Lower RN on 09/14/2010   Method used:   Electronically to        [facility]  [address] (retail)       [address]       [place], [place]  [address]       Ph: [phone]       Fax: [phone]   RxID:   [id number] NICODERM CQ 14 MG/24HR PT24 (NICOTINE) step down after 21mg  , daily x 2 weeks  #14 x 0   Entered by:   Richard Lower RN   Authorized by:   Richard Brunswick, MD, [facility]   Signed by:   Richard Lower RN on 09/14/2010   Method used:   Electronically to        [facility]  [address] (retail)       [address]       [place], [place]  [address]       Ph: [phone]       Fax: [phone]   RxID:   [id number] NICOTINE 21 MG/24HR PT24 (NICOTINE) daily  #14 x 0   Entered  by:   Richard Lower RN   Authorized by:   Richard Brunswick, MD, [facility]   Signed by:   Richard Lower RN on 09/14/2010   Method used:   Electronically to        [facility]  [address] (retail)       [address]       [place], [place]  [address]       Ph: [phone]       Fax: [phone]   RxID:   [id number] LISINOPRIL 20 MG TABS (LISINOPRIL) Take one tablet by mouth daily  #30 x 6   Entered by:   Richard Lower RN   Authorized by:   Richard Brunswick, MD, [facility]   Signed by:   Richard Lower RN on 09/14/2010   Method used:   Electronically to        [facility]  [address] (retail)       [address]       [place], [place]  [address]       Ph: [phone]       Fax: [phone]   RxID:   [id number]

## 2010-10-13 NOTE — Assessment & Plan Note (Signed)
Summary: NEEDS REFILL ON NEXIUM/DR. ROURK PT/LAW   Vital Signs:  Patient profile:   45 year old male Height:      71 inches Weight:      224.50 pounds BMI:     31.42 Temp:     98.8 degrees F oral Pulse rate:   64 / minute BP sitting:   128 / 82  (left arm)  Vitals Entered By: Carolan Clines LPN (October 03, 2010 3:45 PM)  Visit Type:  Follow-up Visit Primary Care Provider:  Dr. Kari Baars   History of Present Illness: Pt presents today in f/u for chronic GERD. Has lost down to 224. 11 lbs down since April 2011. +reflux, "flutters" if eats too late then lays down. Hard to stay away from tomatoes, which causes fluttering feeling. Taking Nexium twice/day. Sometimes skips in the morning because so busy.  No abdominal pain, no dysphagia. Nausea with Mtn Dew. If doesn't drink it, no nausea. was drinking a case/day. Not drinking diet drinks. Per report, last A1c was 8.9. Not being managed by anyone currently. States unable to see Dr. Juanetta Gosling because still had outstanding balance.   Current Medications (verified): 1)  Metoprolol Succinate 50 Mg Xr24h-Tab (Metoprolol Succinate) .... Take 1 and 1/2 Tablet By Mouth Once Daily 2)  Aspirin 325 Mg Tabs (Aspirin) .... Take 1 Tablet By Mouth Once A Day 3)  Tylenol 325 Mg Tabs (Acetaminophen) .... Take As Needed 4)  Nexium 40 Mg Cpdr (Esomeprazole Magnesium) .... Take 1 Capsule By Mouth Two Times A Day 5)  Vicodin Es 7.5-750 Mg Tabs (Hydrocodone-Acetaminophen) .... Take As Needed 6)  Robaxin 500 Mg Tabs (Methocarbamol) .... Prn 7)  Xanax 0.5 Mg Tabs (Alprazolam) .... Take As Needed 8)  Tums 500 Mg Chew (Calcium Carbonate Antacid) .... Take As Needed 9)  Pravachol 40 Mg Tabs (Pravastatin Sodium) .... Take 1 Tablet By Mouth At Bedtime 10)  Lisinopril 20 Mg Tabs (Lisinopril) .... Take One Tablet By Mouth Daily 11)  Nicotine 21 Mg/24hr Pt24 (Nicotine) .... Daily 12)  Nicoderm Cq 14 Mg/24hr Pt24 (Nicotine) .... Step Down After 21mg  , Daily X 2 Weeks 13)   Nicoderm Cq 7 Mg/24hr Pt24 (Nicotine) .... After 14mg  Stepdown, Daily  Allergies: 1)  ! Pcn 2)  ! * Mushrooms  Past History:  Past Medical History: Last updated: 09/14/2010 Chest pain, palpitations: cath 2005- 30-40% mid LAD, 20% D1, 20% cx, OM, 20-30% RCA, and EF-55% Hyperlipidemia Hypertension Tobacco abuse Obesity Chronic obstructive pulmonary disease Gastroesophageal reflux disease- hiatal hernia Depression Colitis-1990  Review of Systems General:  Denies fever, chills, and anorexia. Eyes:  Denies blurring, irritation, and discharge. ENT:  Denies sore throat, hoarseness, and difficulty swallowing. CV:  Denies chest pains and syncope. Resp:  Denies dyspnea at rest and wheezing. GI:  See HPI. GU:  Denies urinary burning and urinary frequency. MS:  Denies joint pain / LOM, joint swelling, and joint stiffness. Derm:  Denies rash, itching, and dry skin. Neuro:  Denies weakness and syncope. Psych:  Denies depression and anxiety. Endo:  Denies cold intolerance and heat intolerance. Heme:  Denies bruising and bleeding.  Physical Exam  General:  Well developed, well nourished, no acute distress. Head:  Normocephalic and atraumatic. Eyes:  sclera without icterus Mouth:  No deformity or lesions, dentition normal. Lungs:  Clear throughout to auscultation. Heart:  Regular rate and rhythm; no murmurs, rubs,  or bruits. Abdomen:  +BS, soft, non-tender, non-distended. no rebound or guarding. no HSM.  Msk:  Symmetrical with  no gross deformities. Normal posture. Neurologic:  Alert and  oriented x4;  grossly normal neurologically. Skin:  Intact without significant lesions or rashes. Psych:  Alert and cooperative. Normal mood and affect.   Impression & Recommendations:  Problem # 1:  GASTROESOPHAGEAL REFLUX DISEASE (ICD-96.10)  45 year old with chronic GERD. 11 lb wt loss since last April. Continues to drink [facility], case a day. States has nausea if drinks it. Denies epigastric  pain. Occasional nocturnal reflux and "fluttering" if lays down too soon after eating. On Nexium twice/day.    Wt loss discussed. Exercise most day of the week. Low-fat diet. Switch to Dexilant, #15 samples given and rx savings card. Pt to call with PR in 10 days. Will send rx if notices improvement. Abstain from [facility], as this causes nausea. Discussed for at least 15 min importance of diet/exercise F/U in 6 mos  Orders: Est. Patient Level II (91478)  Problem # 2:  DIABETES MELLITUS-TYPE II (ICD-250.00)  Elevated A1c per pt report. Last fasting glucose in computer elevated. Pt continues to be noncompliant, drinking Mt Dew. Has not been to see PCP as states outstanding balance. Again, spoke with pt at length regarding his risk factors. We will refer him back to Dr. Juanetta Gosling. Asked pt to inquire about payment plan in order to facilitate being seen at office. Pt states understanding. Wife was present and agrees.   F/U in 6 mos  Orders: Est. Patient Level II (29562)   Orders Added: 1)  Est. Patient Level II [13086]  Appended Document: NEEDS REFILL ON NEXIUM/DR. ROURK PT/LAW 6 month f/u opv is in the computer  Appended Document: NEEDS REFILL ON NEXIUM/DR. ROURK PT/LAW Pt has a past due amount with Dr Juanetta Gosling will refer to [facility].  Pts wife stated she will call and see if she can get appt and call me back if she has problems.Marland KitchenMarland Kitchen

## 2010-10-18 ENCOUNTER — Telehealth: Payer: Self-pay

## 2010-10-18 NOTE — Telephone Encounter (Signed)
Pt called- dexilant was working ok but his symptoms were not completely controlled. Was still having reflux symptoms. Would like nexium called into [facility].

## 2010-10-19 NOTE — Telephone Encounter (Signed)
May call in Nexium 40 mg one po daily 30 minutes before breakfast, disp # 31 with 5 refills to pharmacy.

## 2010-10-19 NOTE — Telephone Encounter (Signed)
rx called to [facility] pharmacy.

## 2010-11-07 ENCOUNTER — Other Ambulatory Visit: Payer: Self-pay | Admitting: Adult Health

## 2010-11-24 ENCOUNTER — Telehealth: Payer: Self-pay | Admitting: Cardiology

## 2010-11-24 MED ORDER — PRAVASTATIN SODIUM 40 MG PO TABS: 40.0000 mg | ORAL_TABLET | Freq: Every day | ORAL | Status: DC

## 2010-11-24 NOTE — Telephone Encounter (Signed)
Patient's wife states that Dr.Hawkins has patient on Toprol but will not refill it b/c he has not been seen in f/u / patient's wife states that he has been seen b/c they owe a bill to Dr.Hawkins' office and they will not see him / patient's wife wants to know if Dr.Rothbart can refill this medication because he was told he is not supposed to go off of this medication / tg

## 2010-11-24 NOTE — Telephone Encounter (Signed)
Wife states that Dr.Hawkins' office has patient on Toprol / states that they will not refill b/c he has not been seen in f/u / states that they will not see him b/c he owes them a bill / wants to know if Dr.Rothbart will refill / states that patient was told he can not go without this medicine / tg

## 2010-11-25 MED ORDER — METOPROLOL SUCCINATE ER 50 MG PO TB24: 75.0000 mg | ORAL_TABLET | Freq: Every day | ORAL | Status: DC

## 2010-11-25 NOTE — Telephone Encounter (Signed)
E scribed toprol to [facility]

## 2010-11-29 NOTE — Discharge Summary (Signed)
[name], [name]                ACCOUNT NO.:  [id number]   MEDICAL RECORD NO.:  [id number]          PATIENT TYPE:  INP   LOCATION:  2007                         FACILITY:  [facility]   PHYSICIAN:  Pricilla Riffle, MD, FACCDATE OF BIRTH:  [birth year]   DATE OF ADMISSION:  01/26/2008  DATE OF DISCHARGE:  01/27/2008                         DISCHARGE SUMMARY - REFERRING   DISCHARGE DIAGNOSES:  1. Palpitations.  2. Chest discomfort that followed the palpitations of uncertain      etiology.  3. Tobacco use.  4. Heavy caffeine use.  5. Hyperlipidemia.  6. History as previously.   SUMMARY OF HISTORY:  Richard [name] is a 45 year old white male who was  transferred via [facility] from [facility] emergency room to [facility] for evaluation and palpitations and chest discomfort.  According to Richard [name], he stated he was lying in bed on the day of  admission at approximately 4 a.m. watching TV because he was unable to  sleep.  Suddenly, he noticed a fast heart beat that he was unable to  count.  This was followed by anterior chest tightness without radiation,  shortness of breath and diaphoresis.  He did recall a few minutes of  nausea.  He felt dizzy when he stood up, but he did not lose  consciousness.  His wife drove him to the emergency room, and he stated  at time of arrival in emergency room his heart rate was still  fluctuating, and he was still having discomfort.  However, initial pulse  rate in the ER was 88.   PAST MEDICAL HISTORY:  1. Hypertension.  2. Hyperlipidemia.  3. GERD.  4. Hiatal hernia.  5. Fatty liver.  6. Arthritis.  7. Depression.  8. Right shoulder surgery.  9. Prior catheterizations which have shown nonobstructive coronary      artery disease with a negative stress test and stress Myoview in      2004.   LABORATORY DATA:  Admission weight was 103.8 kg.  Admission H&H was 15.5  an 45.7, normal indices, platelets 195, WBC 7.9, PTT 38, PT 12.18,  sodium  138, potassium 3.0, BUN 15, creatinine 1.07.  On July 13, BUN and  creatinine were 9 and 0.95, potassium 4.5, BUN 137, normal LFTs.  Hemoglobin A1c was 6.0.  CK MBs relative indexes were within normal  limits x3.  Fasting lipids showed a total cholesterol of 139,  triglycerides of 171, HDL of 31 and LDL of 74.  TSH was within normal  limits at 1.410.  Chest x-ray on July 12 did not show any acute  abnormalities.  EKGs did not show any acute changes.   HOSPITAL COURSE:  The patient was admitted to [facility] for  further evaluation.  He was placed on IV heparin and continued on his  home medications.  He did not have any further chest discomfort or  palpitations.  Telemetry did not show any evidence of dysrhythmias.  By  the July 13, he was ambulating without difficulty.  He ruled out for  myocardial infarction.  After review, Dr. Tenny Craw felt that  the patient  could be discharged home with outpatient follow-up post event monitor  and stress Myoview.   DISPOSITION:  The patient is discharged home.   DISCHARGE MEDICATIONS:  1. Metoprolol XL 75 mg q.h.s.  2. Nexium 40 mg daily.  3. Aspirin 325 daily.  4. Simvastatin q.h.s.  5. Xanax 0.5  6. Tylenol as previously.   DISCHARGE INSTRUCTIONS:  1. Dr. Marvel Plan office will call him with arrangements for an event      monitor, stress Myoview and a follow-up appointment.  2. Activity and wound care were not restricted or applicable.  3. He is asked to maintain low-sodium heart-healthy diet.  4. He was advised to maintain no smoking or tobacco products and to      decrease his caffeine intake.  5. Bring all medications to all appointments.   DISCHARGE TIME:  Thirty five minutes.      Joellyn Rued, PA-C      Pricilla Riffle, MD, [facility]  Electronically Signed    EW/MEDQ  D:  01/27/2008  T:  01/27/2008  Job:  [id number]   cc:   Ramon Dredge L. Juanetta Gosling, M.D.  Gerrit Friends. Dietrich Pates, MD, [facility]  Kassie Mends, M.D.

## 2010-11-29 NOTE — Letter (Signed)
March 08, 2007    Richard Dredge L. Richard [name], M.D.  [address]  [place], [place] [address]   RE:  Richard [name]  MRN:  [id number]  /  DOB:  [birth year]   Dear Richard [name]:   Richard [name] returns to the office for continued assessment and treatment  of hypertension, dyslipidemia and tobacco abuse.  Since I last saw him 1  year ago, he has been readmitted to the hospital with recurrent chest  pain.  Myocardial infarction was ruled out.  In light of negative  coronary angiography in 2001 and 2005, no further testing was  undertaken.  He has done well subsequently from a cardiac stand-point.  I have no lipid profile from within the last 2 years.  He experiences  significant nicotine withdrawal when he stops smoking, so he is not  motivated to do so.  Blood pressure control has been good.  He has been  out of work due to right shoulder problems following a rotator cuff tear  and a repair.   CURRENT MEDICATIONS:  Toprol 75 mg daily, aspirin 325 mg daily, Nexium  40 mg daily, Simvastatin 40 mg daily.   On exam, a pleasant gentleman in no acute distress. The weight is 214,  36 pounds more than 1 year ago. Blood pressure 115/75, heart rate 66 and  regular, respirations 18.   Neck:  No jugular venous distension; no carotid bruits.  Lungs:  Clear.  Cardiac:  split first heart sounds; normal second heart sounds; normal  PMI.  Abdomen:  Soft and nontender; no organomegaly.  Extremities:  No edema;  normal distal pulses.  Limitation of motion of the right shoulder.   IMPRESSION:  Richard [name] is doing well from a cardiac stand-point.  A  lipid profile and chemistry profile will be obtained.  I will reassess  this nice gentleman in 1 year.    Sincerely,      Gerrit Friends. Dietrich Pates, MD, [facility]  Electronically Signed    RMR/MedQ  DD: 03/08/2007  DT: 03/09/2007  Job #: [id number]

## 2010-11-29 NOTE — Assessment & Plan Note (Signed)
Richard [name], Richard [name]                 CHART#:  [id number]   DATE:  12/02/2007                       DOB:  1965/11/08   PROBLEM LIST:  1. Chest pain secondary to gastroesophageal reflux disease.  2. Fatty liver disease.  3. Hypertension.  4. Nonobstructive coronary artery disease.  5. Arthritis  6. Depression.  7. Hyperlipidemia.  8. Right shoulder surgery   SUBJECTIVE:  Richard [name] is a 45 year old male who presents as a return  patient visit.  He has been treated for gastroesophageal reflux disease  by Dr. Karilyn Cota in the past.  He is supposed to be taking Nexium.  He has  had several requests for samples.  He is currently taking Prilosec once  a day and eating Tums.  He says he is doing well except for his 32-  pound weight gain since October 2007.   MEDICATIONS:  1. Metoprolol  2. Aspirin  3. Tylenol.  4. Vicodin as needed.  5. Muscle relaxant as needed.  6. Over-the-counter Prilosec once a day.  7. Xanax as needed.  8. Tums as needed.   OBJECTIVE:   PHYSICAL EXAMINATION:  VITAL SIGNS:  Weight 225 pounds, height 5 feet  11, BMI 31.4 (obese).  Temperature 97.7, blood pressure 130/90, pulse  64.  GENERAL:  He is in no apparent distress, alert and oriented x4.  LUNGS:  Clear to auscultation bilaterally.  CARDIOVASCULAR:  Regular rhythm.  ABDOMEN:  Bowel sounds present, soft.  Mild tenderness to palpation in  the epigastrium without rebound or guarding.   ASSESSMENT:  Richard [name] is a 45 year old male whose gastroesophageal  reflux disease is not ideally controlled on Prilosec once a day and  using Tums as needed.  Thank you for allowing me to see Richard [name] in  consultation.  My recommendations follow.   RECOMMENDATIONS:  1. I have recommended to Richard [name] that he increase Prilosec to twice      a day.  2. I strongly encouraged him to lose his 10-20 pounds.  We discussed      calorie restriction, portion size, and increasing his exercise.  3. He has a follow up  appointment to see me in 2 months.       Kassie Mends, M.D.  Electronically Signed     SM/MEDQ  D:  12/02/2007  T:  12/02/2007  Job:  [id number]   cc:   Ramon Dredge L. Juanetta Gosling, M.D.

## 2010-11-29 NOTE — H&P (Signed)
NAMEAMIAS, HUTCHINSON NO.:  [id number]   MEDICAL RECORD NO.:  [id number]          PATIENT TYPE:  INP   LOCATION:  2007                         FACILITY:  [facility]   PHYSICIAN:  Luis Abed, MD, FACCDATE OF BIRTH:  03/04/1966   DATE OF ADMISSION:  01/26/2008  DATE OF DISCHARGE:                              HISTORY & PHYSICAL   BRIEF HISTORY:  Mr. Grater is a 45 year old white male who was  transferred via [facility] from [facility] to [facility]  2000.  The palpitations and chest discomfort.   Mr. Lall describes lying in bed around 4:00 a.m. watching TV because  he was unable to sleep, suddenly he noticed a fast heart beat.  He  states that he was unable to count the heart beat.  This was followed by  anterior chest tightness without radiation, shortness of breath, or  diaphoresis.  He states that he did have a few minutes of nausea.  He  also stated it was hard to brief, but he was not short of breath.  When  he stood up, he felt dizzy, like he was going to pass out, but he did  not lose consciousness.  His wife drove him to the emergency room.  By  the time, he arrived to the emergency room he said his heart beat was  still fluctuating, but not as fast and his chest discomfort had  decreased.  In the ER, it is recorded that his vital signs showed a  pulse of 88 and blood pressure 165/90.  He admits to prior occurrences,  but he is not sure when the last occurrence was.  He has had problems  with palpitations and chest discomfort together and separately in the  past, but has never received a specific diagnosis in regards to the  palpitations.  He has been hospitalized many times with chest discomfort  and has primarily been associated with GERD and prior catheterization  has shown nonobstructive coronary disease.   PAST MEDICAL HISTORY:  He is allergic to PENICILLIN.   MEDICATIONS:  Prior to admission according to the patient include;  1.  Metoprolol XL 75 mg nightly.  2. Nexium 40 mg daily.  3. Aspirin 325 daily.  4. Simvastatin unknown dose nightly.  5. Xanax 0.5 mg p.r.n.  6. Tylenol p.r.n.   He has a history of hypertension which he does not check his blood  pressure at home, hyperlipidemia unknown last check,  history of GERD,  hiatal hernia, fatty liver, arthritis, depression, right shoulder  surgery.  He has had a long history of recurring chest discomfort.  His  first catheterization one showed nonobstructive coronary artery disease.  Last cath in October 29, 2003 showed a 30-40% mid LAD, 20% diagonal I, 20%  circumflex AV branch, 30-40% OM, 20-30% proximal and mid RCA, and EF  55%.  Last stress test on February 26, 2006 was submaximal with a 74%  predicted maximum heart rate that was essentially unremarkable.  Last  stress Myoview was on October 01, 2002 showed an EF 55%, no ischemia.  SOCIAL HISTORY:  He resides in [place] with his wife.  He has 5  children, 1 grandchild.  He is unemployed.  Prior to an appointment, he  was a [employer] representative.  He admits to continued tobacco use at  least a half a pack per day for 26 years.  Denies alcohol, drugs, or  herbal medications, specific diet, or exercise program.   FAMILY HISTORY:  Mother died at the age of 44 with lung cancer.  His  father is alive 36.  His first myocardial infarction was at 75.  He  states his father has severe heart problems and history of diabetes.  One brother is deceased with a gunshot wound.  Two sisters are living, 1  has hypertension.  The other has spinal problems secondary to a  childhood spinal tumor.  He has 5 brothers who are living, one brother  has first MI at the age of 82, another has diabetes.   REVIEW OF SYSTEMS:  The patient states he felt bad over the last 2  weeks.  He also admits to a 50-pound weight gain in the last year,  chronic sinus problems, glasses, poor dentition.  He has not seen a  dentist since 70s.  He  admits to have post exertional edema in his  upper and lower extremities, palpitations of nonspecific duration and  frequency.  He does snore, his wife does feel that he stops breathing at  night, but he has never been evaluated for sleep apnea, nocturia,  generalized weakness, depression, anxiety, arthralgias particularly in  his right lower extremity right shoulder and hands, GERD, abdominal  discomfort, and water brash.   PHYSICAL EXAMINATION:  GENERAL:  Well-nourished, well-developed,  pleasant white male in no apparent distress.  VITAL SIGNS:  Temperature is 97.3, pulse is 68, respirations 18, blood  pressure 118/73, 98% sat on 2 L, weight 103.8 kg.  HEENT:  Unremarkable.  NECK:  Supple without thyromegaly, adenopathy, JVD, or carotid bruits.  CHEST:  Symmetrical excursion.  LUNGS:  Sounds were slightly diminished but overall clear to  auscultation.  HEART:  PMI is nondisplaced.  Regular rate and rhythm.  Normal S1 and  S2.  I do not appreciate any murmurs, rubs, clicks, or gallops.  All  pulses are symmetrical and intact without abdominal or femoral bruits.  SKIN:  Integument is intact without rashes or lesions.  ABDOMEN:  Slightly obese.  Bowel sounds present without organomegaly or  masses.  He does have right upper quadrant tenderness that is chronic in  nature per patient's.  EXTREMITIES:  Negative cyanosis, clubbing, or edema.  MUSCULOSKELETAL:  Grossly unremarkable.  NEURO:  Unremarkable.   Chest x-ray shows no acute cardiopulmonary disease.  EKG shows normal  sinus rhythm, normal axis, insignificant Q-waves, borderline LVH with  repolarization change with nonspecific ST-T wave changes.  H&H is 15.5  and 45.7, normal indices, platelets 195, WBCs 7.9, sodium 138, potassium  3.0, BUN 15, creatinine 1.07, glucose 140.  BNP is less than 30.  Point  of care markers were negative x2.   IMPRESSION:  1. Palpitations followed by chest discomfort.  Initial vital signs,      EKG  does not show any evidence of dysrhythmia or acute changes. 2.      Hypertension.  2. Tobacco use.  3. Probable obstructive sleep apnea.  4. Abdominal tenderness with chronic history of gastroesophageal      reflux disease, hiatal hernia, and fatty liver.   History as noted  per past medical history.   DISPOSITION:  Dr. Myrtis Ser spoke, reviewed the patient's history, spoke  with, and examined.  The patient agrees with the above.  We will keep  him overnight.  If he rules out for myocardial infarction and there is  no evidence of  dysrhythmia on telemetry.  We will anticipate discharge home with  outpatient event monitor and follow up stress testing with Dr. Dietrich Pates.  He will need to follow up with Dr. Juanetta Gosling,  they consider further  evaluation of possible obstructive sleep apnea.  The patient has also  been counseled on tobacco cessation.      Joellyn Rued, PA-C      Luis Abed, MD, [facility]  Electronically Signed    EW/MEDQ  D:  01/26/2008  T:  01/26/2008  Job:  [id number]   cc:   Ramon Dredge L. Juanetta Gosling, M.D.  Gerrit Friends. Dietrich Pates, MD, [facility]  Kassie Mends, M.D.  [facility]

## 2010-11-29 NOTE — Letter (Signed)
March 11, 2008    Richard Dredge Richard [name], M.D.  [address]  [place], [place] [address]   RE:  Richard [name]  MRN:  [id number]  /  DOB:  Jan 30, 1966   Dear Dr. Juanetta [name]:   Richard [name] returns to the office following an admission to [facility] in July for palpitations and chest discomfort.  Myocardial  infarction was ruled out.  No arrhythmias were seen on monitor.  A  stress nuclear study was performed immediately after hospitalization and  was entirely negative.  Event recording was planned, but the device  malfunctioned.   Richard [name] has been unemployed and inactive.  He has gained a  substantial amount of weight.  He continues to smoke cigarettes.  He  notes some exertional intolerance and perhaps dyspnea.   Current medications include,  1. Toprol 75 mg daily.  2. Aspirin 325 mg daily.  3. Nexium 40 mg daily.  4. Tylenol p.r.n.  5. A course of erythromycin.   PHYSICAL EXAMINATION:  GENERAL:  Pleasant gentleman in no acute  distress.  VITAL SIGNS:  The weight is 231, up 17 pounds since last year.  Blood  pressure fortunately remains good at 125/80, heart rate 65 and regular,  respirations 12 and unlabored.  NECK:  No jugular venous distention; no carotid bruits.  LUNGS:  Minimal increase in the expiratory phase; no rhonchi, no rales.  cardiac:  Normal first and second heart sounds.  ABDOMEN:  Soft and nontender; no organomegaly.  EXTREMITIES:  No edema.   Hospital records were obtained and reviewed.  Richard [name] continues to  have episodic chest discomfort that appears unrelated to ischemic heart  disease.  He has already treated with moderate doses of PPI.  I have  recommended that he use over-the-counter nicotine patches to help him  entirely, discontinue cigarette smoking.  He will attempt to increase  activity as well, which may promote some weight loss.  He developed ache  and discomfort in the legs with simvastatin and recently discontinued  that drug.  We will  try pravastatin at a dose of 20 mg daily.  I will  see this nice gentleman again in 6 months.  A lipid profile will be  obtained in 2 months.    Sincerely,      Richard Friends. Dietrich Pates, MD, [facility]  Electronically Signed    RMR/MedQ  DD: 03/11/2008  DT: 03/12/2008  Job #: [id number]

## 2010-11-29 NOTE — Assessment & Plan Note (Signed)
NAMEROLLEN, SELDERS                 CHART#:  [id number]   DATE:  05/04/2008                       DOB:  [birth year]   REFERRING PHYSICIAN:  Oneal Deputy. Juanetta Gosling, MD.   PRIMARY CARDIOLOGIST:  Gerrit Friends. Dietrich Pates, MD, [facility].   PROBLEM LIST:  1. Nonobstructive coronary artery disease.  2. Allergy to penicillin.  3. Hypertension.  4. Hyperlipidemia.  5. Gastroesophageal reflux disease.  6. Hiatal hernia.  7. Fatty liver.  8. Depression.  9. Tobacco dependence.   SUBJECTIVE:  The patient is a 45 year old male who presents as a return  patient visit.  He is complaining of pain on his right side for last 2-  1/2 to 3 weeks.  Yesterday and today, it is better.  He says it feels  like a pressure.  He claims [facility] makes him sick and nauseated.  He is only able to drink water.  The pressure was all the time when he  was laying down or sitting up.  He describes it as severe.  It was not  associated with fever, chills, or change in the color of her stool.  He  is having daily bowel movement without straining.  He denies any blood  in stool.  His eyes have been yellow.  He has had no vomiting.  He is  only nauseated with [facility] and caffeine.  His daughter ran away  and he went from smoking 1-pack a day to 2 packs a day.  He has never  had a history of acute MI or strokes.  He was off Medicaid now he got  it back.  His heartburn is fairly well controlled on Nexium once a day.  He is not having to take extra Tums. He may get up in the middle of the  night and eat pizza.   MEDICATIONS:  1. Metoprolol.  2. Aspirin.  3. Tylenol.  4. Nexium 40 mg daily.  5. Vicodin.  6. Muscle relaxant.  7. Xanax.  8. Tums as needed.  9. Erythromycin 333 three times a day.   OBJECTIVE/PHYSICAL EXAM:  VITAL SIGNS:  Weight 233 pounds (up 8 pounds  since May 2009), height 5 feet 11 inches, temperature 98, blood pressure  132/80, and pulse 60.  GENERAL:  He is in no apparent distress.  Alert and  oriented x4.LUNGS:  Clear to auscultation bilaterally.CARDIOVASCULAR:  Shows regular rhythm.  No murmur.  Normal S1 and S2.HEENT:  Atraumatic and normocephalic.  Pupils equal and react to light.  Poor dentition.  Mouth, no oral  lesions.  Posterior pharynx without erythema or exudate.ABDOMEN:  Bowel  sounds are present, soft with mild tenderness to palpation in the right  upper quadrant without rebound or guarding. NEUROLOGIC:  He has no focal  neurologic deficit.   ASSESSMENT:  The patient is a 45 year old male with epigastric and right-  sided abdominal pain, which is likely secondary to gastritis,  duodenitis, or functional abdominal pain.  The differential diagnosis  includes cholecystitis cholangitis, and a low likelihood of  pancreatitis.  Thank you for allowing me to see the patient in  consultation.  My recommendations follow.   RECOMMENDATIONS:  1. He is asked to hold his aspirin for 14 days.  He is to continue the      Nexium daily.  Will check a CBC,  CMP, and a lipase on today.  2. The patient is to visit in 6 weeks.  Will call the patient with the      results of his labs.  If he has any evidence of cholangitis or      pancreatitis, then we will proceed with imaging studies.       Kassie Mends, M.D.  Electronically Signed     SM/MEDQ  D:  05/04/2008  T:  05/04/2008  Job:  [id number]   cc:   Ramon Dredge L. Juanetta Gosling, M.D.  Gerrit Friends. Dietrich Pates, MD, [facility]

## 2010-11-29 NOTE — Letter (Signed)
October 27, 2008    Edward L. Juanetta Gosling, MD  [address]  [place], [place] [address]   RE:  Richard [name], Richard [name]  MRN:  [id number]  /  DOB:  04-Jul-1966   Dear Ed:   Richard [name] returns to the office for continued management of  cardiovascular risk factors.  Since his last visit, he has done well  from a symptomatic standpoint.  He has noted no chest pain or dyspnea.  He has been exercising on a regular basis without difficulty.  His blood  pressure control has been good.  He has been losing a significant amount  of weight.  He has not had lipids assessed in some time.  Unfortunately,  he continues to smoke cigarettes at a rate of one-half pack per day.  He  has tried to stop before with significant withdrawal symptoms.  He did  stop it at one time for 3 months, but resumed.   CURRENT MEDICATIONS:  1. Toprol 75 mg daily.  2. Aspirin 325 mg daily.  3. Nexium 40 mg daily.  4. Pravastatin 20 mg daily.   PHYSICAL EXAMINATION:  GENERAL:  A pleasant proportionate gentleman in  no acute distress.  VITAL SIGNS:  The weight is 226, 5 pounds less than in August 2009 and  23 pounds less than his peak weight.  Blood pressure 125/80, heart rate  64 and regular, and respirations 12 and unlabored.  NECK:  No jugular venous distention; normal carotid upstrokes without  bruits.  LUNGS:  Clear.  CARDIAC:  Normal first and second heart sounds.  ABDOMEN:  Slightly distended; soft and nontender; no organomegaly.  EXTREMITIES:  Normal distal pulses; no edema.   IMPRESSION:  Richard [name] was congratulated on his weight loss.  I told  him that he needs to reach a stable weight and then strongly considered  discontinuation of tobacco products with the assistance of nicotine  replacement or Chantix.  A lipid profile and chemistry profile will be  obtained.  His current medications appear to be appropriate.  If his  blood test results are good, I will plan to see this nice gentleman  again in 1 year.    Sincerely,      Gerrit Friends. Dietrich Pates, MD, [facility]  Electronically Signed    RMR/MedQ  DD: 10/27/2008  DT: 10/28/2008  Job #: [id number]

## 2010-11-29 NOTE — Assessment & Plan Note (Signed)
Richard [name], [place]                 CHART#:  [id number]   DATE:  06/23/2008                       DOB:  [birth year]   REFERRING PHYSICIAN:  Oneal Deputy. Juanetta Gosling, MD   CARDIOLOGIST:  Gerrit Friends. Dietrich Pates, MD, [facility]   PROBLEM LIST:  1. Gastroesophageal reflux disease.  2. Hypertension.  3. Hyperlipidemia.  4. Obesity.  5. Allergy to penicillin.  6. Nonobstructive coronary artery disease.  7. Hiatal hernia.  8. Depression.  9. Tobacco dependence.   SUBJECTIVE:  Mr. Wiedeman is a 45 year old male who presents as a return  patient visit.  He was last seen on October 2009 and was complaining of  epigastric and right-sided abdominal pain.  His last EGD was in 2003.  He recently fell and hurt his right side.  He really has indigestion.  He has indigestion less than once a week.  He denies any nausea or  vomiting.  He has gained 18 pounds since 2005.  He is now smoking less  than one pack every 2 days.  He is intending to quit after Christmas.   MEDICATIONS:  1. Metoprolol.  2. Aspirin 325 mg daily.  3. Tylenol as needed.  4. Nexium 40 mg 1-2 times a day.  5. Vicodin.  6. Muscle relaxant as needed.  7. Xanax as needed.  8. Tums as needed.  9. Pravachol.   OBJECTIVE:   PHYSICAL EXAMINATION:  VITAL SIGNS:  Weight 233 pounds, height 6 feet,  BMI 31.6 (obese), temperature 98.1, blood pressure 122/80, and pulse 60.  GENERAL:  He is in no apparent distress.  Alert and oriented x4.LUNGS:  Clear to auscultation bilaterally. CARDIOVASCULAR:  Regular  rhythm.ABDOMEN:  Bowel sounds are present.  Soft, nontender, and  nondistended.  EXTREMITIES:  No edema.   ASSESSMENT:  Mr. Mciver is a 45 year old male with gastroesophageal  reflux disease and his symptoms are well controlled on Nexium 1-2 a day.  He also had liver enzymes measured in October 2009 and his ALT was 59 (0-  53).  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  Thank you for allowing me to see Mr.  Haack in  consultation.  My recommendations follow.   RECOMMENDATIONS:  1. Encouraged Mr. Tetrault to quit smoking.  He was also asked to lose      10-20 pounds.  I explained the benefits of losing weight in regards      to his liver.  2. He was given a low-fat diet handout.  3. He may continue to use the Nexium 1-2 times a day.  4. He has a follow up appointment to see me in 6 months.       Kassie Mends, M.D.  Electronically Signed     SM/MEDQ  D:  06/23/2008  T:  06/23/2008  Job:  [id number]   cc:   Ramon Dredge L. Juanetta Gosling, M.D.  Gerrit Friends. Dietrich Pates, MD, [facility]

## 2010-12-02 NOTE — Group Therapy Note (Signed)
NAME:  ALPHONZA, TRAMELL                          ACCOUNT NO.:  [id number]   MEDICAL RECORD NO.:  [id number]                   PATIENT TYPE:  INP   LOCATION:  A227                                 FACILITY:  [facility]   PHYSICIAN:  Edward L. Juanetta Gosling, M.D.             DATE OF BIRTH:  [birth year]   DATE OF PROCEDURE:  DATE OF DISCHARGE:  10/29/2003                                   PROGRESS NOTE   PROBLEM:  Chest discomfort.   SUBJECTIVE:  Mr. Broom has had no more chest pain.  He says he is feeling  pretty well.  He has no complaints thi morning.  He is set for transfer to  [facility] for cardiac catheterization.   OBJECTIVE:  His exam today shows that his chest is actually quite clear.  His heart is regular without gallop.  His abdomen is soft.  His extremities  showed no edema.   ASSESSMENT:  He is doing okay.   PLAN:  Set him up for the transfer later today.      ___________________________________________                                            Oneal Deputy. Juanetta Gosling, M.D.   ELH/MEDQ  D:  10/29/2003  T:  10/29/2003  Job:  [id number]

## 2010-12-02 NOTE — Consult Note (Signed)
[facility]  Patient:    Richard [name], Richard [name] Visit Number: [id number] MRN: [id number]          Service Type: OUT Location: RAD Attending Physician:  Fredirick Maudlin Dictated by:   Gardiner Coins, P.A.-C. Proc. Date: 10/01/01 Admit Date:  09/18/2001 Discharge Date: 09/18/2001   CC:         Kari Baars, M.D.   Consultation Report  DATE OF BIRTH:  14-Nov-1965  REFERRING PHYSICIAN:  Kari Baars, M.D.  CHIEF COMPLAINT:  "A lot of stomach pain."  HISTORY OF PRESENT ILLNESS:  The patient is a very nice 45 year old Caucasian male referred through the courtesy of Dr. Juanetta Gosling for further evaluation of right upper quadrant and epigastric-type pain that has been ongoing intermittently over the last year.  Describes the pain in the right upper quadrant as pressure/toothache-type pain, mainly when he is working, particularly lifting. If he takes some time off work, he has noticed that the pain goes away.  He has also noted some epigastric burning-type pain that seems to occur with the right upper quadrant pain.  Symptoms are worse with fasting.  No associated nausea or vomiting.  He does have some liquid reflux and regurgitation.  Has noted some pill dysphagia recently, but no solid or liquid dysphagia.  Has heartburn approximately two times per week.  Was recently started on Nexium by Dr. Juanetta Gosling, but he has not been taking this regularly.  Generally, does not like taking medications.  Appetite has been good.  He has lost some weight intentionally through dietary changes.  Has been having bowel movements one to two times per day with stools being soft and formed.  No bright red blood per rectum or melena.  Takes daily aspirin 325 mg q.d.  Does not take other over-the-counter NSAIDs, but uses Tylenol on a p.r.n. basis.  Was using Tylenol two p.o. q.5-6h. for back and abdominal pain, but has not been doing this over the last month.  Has had some  "light" chest pain in the evenings where he feels raw under the breast bone.  Not associated with exertion.  No associated dyspnea.  Did have cardiac catheterization approximately       2-1/2 years ago with no stenting.  Did have 30-60% blockage of two small arteries.  Is followed by [facility].  Workup done through Dr. Juanetta Gosling office has included right upper quadrant ultrasound that revealed echo dense liver, felt to most likely be secondary to fatty change with an area in the right lobe that appeared to be spared. Gallbladder was normal and common bile duct measured 4 mm.  Followup CT scan done September 18, 2001, revealed diffuse fatty infiltration of the liver with an area of focal sparing in the posterior right lobe of the liver, and a portion of the caudate lobe of the liver.  The remainder of the exam was normal.  Lab work done September 24, 2001, shows white blood cell count 7.5, hemoglobin 16.7, hematocrit 47.9, MCV 86.6, platelet count 251,000.  PT 11.4, INR 0.8. Creatinine done September 17, 2001, was normal at 1.1.  No prior history of EGD. Did have flexible sigmoidoscopy, Nov 15, 1988, for further evaluation of fever, nausea, vomiting, and bloody diarrhea.  It was felt that he had an infectious proctocolitis.  No personal history of peptic ulcer disease.  H. pylori status uncertain. Does have a brother who may have had stomach ulcers.  CURRENT MEDICATIONS:  Toprol XL 75 mg q.d., aspirin 325  mg q.d., Tums p.r.n., Nexium supposed to be taken q.d., but taking p.r.n., and Tylenol p.r.n.  ALLERGIES:  PENICILLIN.  PAST MEDICAL HISTORY:  Includes history of gastroesophageal reflux disease, history and symptoms consistent with irritable bowel syndrome, scoliosis, hypertension, and coronary artery disease as outlined in HPI.  PAST SURGICAL HISTORY:  Cardiac catheterization approximately 2-1/2 years ago as outlined in HPI.  FAMILY HISTORY:  No colorectal cancer.  Father is alive and  has a history of diabetes mellitus and what sounds like abdominal aortic aneurysm.  Mother deceased in 62 secondary to lung cancer.  One brother with diabetes mellitus.  One brother with epilepsy who may have had stomach ulcers.  No history of chronic liver disease.  SOCIAL HISTORY:  The patient has been married for 17 years and has five children.  He is smoking less than one-pack-per-day and is trying to cut back and quit.  Began smoking in 1984.  No ETOH use and no history of heavy use.  REVIEW OF SYSTEMS:  No fevers or chills.  Does have a chronic cough for which Dr. Juanetta Gosling has told him to quit smoking.  No wheezing or dyspnea.  Otherwise as in HPI.  PHYSICAL EXAMINATION:  VITAL SIGNS:  Weight 207 pounds.  Temperature 98.0, blood pressure 124/72, pulse 64 and regular.  GENERAL:  A very pleasant, cooperative, and alert Caucasian male in no acute distress.  Answers questions quickly and appropriately.  HEENT:  Atraumatic and normocephalic.  PERRL.  Sclerae are nonicteric. Conjunctivae clear.  Oropharynx pink and moist without lesions.  NECK:  Supple, no masses, and no adenopathy.  Trachea midline.  SKIN:  Warm and dry.  No jaundice.  LUNGS:  Clear to auscultation bilaterally with symmetrical expansion.  CARDIOVASCULAR:  S1 and S2, regular rate and rhythm without murmur.  ABDOMEN:  Positive normoactive bowel sounds, soft, mildly to moderately tender to palpation in the epigastric area and right upper quadrant without mass or organomegaly.  RECTAL:  Adequate sphincter tone.  No masses in the anal canal or rectal vault. Stool guaiac negative.  EXTREMITIES:  No lower extremity edema.  LABORATORY AND X-RAYS:  As in HPI.  IMPRESSION:  The patient is a 45 year old Caucasian male with right upper quadrant and epigastric pain intermittently over the last year.  No associated nausea or vomiting.  Feel his symptoms are most likely related to gastroesophageal reflux disease or  possibly peptic ulcer disease.  Symptoms  are worse with fasting and exacerbated by heavy lifting.  No mentioned above, abdominal wall examination revealed no tenderness with discomfort being significantly worse with abdominal wall relaxed.  He also has sensation of pill dysphagia as well as chronic cough which may also be manifestations of gastroesophageal reflux disease.  He is on daily Ansaid which puts him at an increased risk of peptic ulcer disease.  Will also check on some other possibilities.  No abnormalities of the liver other than fatty infiltration on computerized tomography.  Although symptoms are not typical chest pain associated with cardiac disease given his history, have encouraged the patient to follow up with cardiologist.  Has fatty liver on computerized tomography. Will check baseline liver function tests as well as amylase.  RECOMMENDATIONS: 1. EGD at [facility] in the near future.  The procedure was reviewed    with the patient and his wife.  Questions were answered and he is agreeable    to proceeding with procedure. 2. Check LFTs and amylase. 3. Begin taking Nexium 40 mg p.o.  q.d. as prescribed. 4. Written information given to the patient on fatty liver as well as    antireflux measures. 5. Further recommendations to follow pending diagnostic procedure.  We would like to thank Dr. Juanetta Gosling for allowing [name] to participate in the care of this very nice gentleman. Dictated by:   Gardiner Coins, P.A.-C. Attending Physician:  Fredirick Maudlin DD:  10/01/01 TD:  10/01/01 Job: [id number]

## 2010-12-02 NOTE — Discharge Summary (Signed)
NAMEGRAYDON, Richard [name]                ACCOUNT NO.:  [id number]   MEDICAL RECORD NO.:  [id number]          PATIENT TYPE:  INP   LOCATION:  A220                          FACILITY:  [facility]   PHYSICIAN:  Edward L. Juanetta Gosling, M.D.DATE OF BIRTH:  January 09, 1966   DATE OF ADMISSION:  11/11/2006  DATE OF DISCHARGE:  04/28/2008LH                               DISCHARGE SUMMARY   FINAL DISCHARGE DIAGNOSES:  1. Chest discomfort.  2. Gastroesophageal reflux disease.  3. Hypertension.  4. Chronic shoulder pain.   HISTORY OF PRESENT ILLNESS:  Richard [name] is a 45 year old with complaints  of chest discomfort.  He has had episodes in the past.  He said he was  talking on the phone, developed severe discomfort in his chest which  apparently is different from what he had in the past.  It got worse when  he took a deep breath and then went away.  He has had previous episodes  of chest discomfort and had a cardiac catheterization and hypertension.  He has gastroesophageal reflux disease which is fairly severe.   His physical exam on admission showed a well-developed, well-nourished  male who is in no acute distress,  O2 sat was 96%.  Blood pressure  116/60.  Chest was clear.  Heart was regular.  Laboratory work was  essentially negative.   HOSPITAL COURSE:  Because of the concern that he might have had a  pulmonary embolus, he had a CT chest.  He had cardiology consultation  and it was felt that he did not need further cardiac workup at this  time.  He could complete his cardiac workup as an outpatient.  He had a  GI consult, it was felt that did not need further GI workup at this  time.  He was told to modify his diet.  His CT was negative.   He is discharged home to continue with Toprol XL 75 mg daily, aspirin  325 mg daily and Nexium that he is going to increase to twice daily from  daily.  He will follow up in my office, in the cardiology office and the  GI office.      Edward L. Juanetta Gosling, M.D.  Electronically Signed     ELH/MEDQ  D:  11/12/2006  T:  11/13/2006  Job:  [id number]

## 2010-12-02 NOTE — Assessment & Plan Note (Signed)
NAMELUNDEN, MCLEISH                 CHART#:  [id number]   DATE:  12/20/2006                       DOB:  April 10, 1966   PRESENTING COMPLAINTS:  Frequent chest pain.   SUBJECTIVE:  Richard [name] is a 45 year old Caucasian male who is here for  scheduled visit.  He is accompanied by his wife.  He is well known to me  from multiple previous evaluations.  He has chronic GERD.  His last EGD  was in 09/2001 and he had erosive/ulcerative esophagitis and he has  remains on PPI.  He also had a gastritis but his h. pylori serology was  negative and in 11/2003 he had ultrasound which was negative for  chololithiasis;it showed fatty liver.   He was briefly hospitalized on 11/12/06 for chest pain and dysphagia,  transient bolus obstruction or regurgitation.  Lab studies are normal.  He was discharged by Dr. Juanetta Gosling.  His troponin level x3 was negative.   He states he continues to experience chest pain which is not daily but  at least 3-4 episodes a week.  It generally occurs at night.  It is  described to be deep pain which is retrosternal.  This is not associated  with diaphoresis or dyspnea.  He seems to think that it occurs when he  eats sandwiches with tomatoes.  He does not even remember the last time  he had heartburn.  He has tried Tums which helps sometimes but not  always.  Pain may last for a few minutes to hours.  He has had cardiac  evaluation in the past.  He has had cath twice as well as non-invasive  studies.  He is felt to have non-critical disease.  He states he had  steroid shot in his right shoulder joint about 3 months ago.  He states  he had some reaction and for several days he had specious appetite.  He  has gained 28 pounds since his last visit to our office 6 months ago.  He states he has gained more than 40 pounds.  He is on metoprolol 75 mg  q.d., ASA 325 mg q.d. Nexium 40 mg b.i.d. Vicodin 7.5/500 q.d. or b.i.d.  p.r.n. which he does not take daily and he is on muscle relaxer  p.r.n.   OBJECTIVE:  Weight 221-1/2 pounds, he is 5 feet 11 inches tall.  Pulse  is 56 per minute.  Blood pressure 122/78, temperature is 98.3.  Conjunctivae is pink.  Sclerae is nonicteric.  Oropharyngeal mucosa is  normal.  No neck masses are normal.  He does not have chest wall  tenderness.  Cardiac exam is regular rhythm, normal S1, no murmur, rub  or gallop noted.  Lungs clear to auscultation.  Abdomen is symmetrical.  Bowel sounds are normal.  He has mild vague tenderness superior to  umbilicus but no guarding or rebound.  No organomegaly or masses.  He  does not have clubbing or peripheral edema.   ASSESSMENT:  Atypical chest pain.  It is occurring 3-4 x a week usually  in the evening.  History of erosive/ulcerative reflux esophagitis.  He  has been double dosed PPI.  His heartburn is well controlled.  I am not  convinced that this pain is due to reflux.  We will also look for  chololithiasis in the past and ultrasound  has been negative.   TREATMENT OPTIONS:  If this pain persists we may have to consider a pH  study.   PLAN:  He will continue antireflux measures as before, Nexium 40 mg  b.i.d. The patient advised to pay more attention to diet and calorie  intake.  The patient advised to cut back on his calorie intake since he  has gained so much weight.  If he is not able to lose weight on his own,  he may need help from a dietician.   Metoclopromide 10 mg p.o. b.i.d.  He was given prescription for q.i.d.  120 with a refill but asked not to take 4 x a day unless he talks to me.  He will call us with a progress report in a few weeks.       Lionel December, M.D.  Electronically Signed     NR/MEDQ  D:  12/20/2006  T:  12/20/2006  Job:  [id number]   cc:   Ramon Dredge L. Juanetta Gosling, M.D.

## 2010-12-02 NOTE — Op Note (Signed)
NAMEKAYEN, Richard [name]                 ACCOUNT NO.:  [id number]   MEDICAL RECORD NO.:  [id number]            PATIENT TYPE:   LOCATION:                                 FACILITY:   PHYSICIAN:  Vania Rea. Supple, M.D.       DATE OF BIRTH:   DATE OF PROCEDURE:  05/16/2006  DATE OF DISCHARGE:                                 OPERATIVE REPORT   PREOP DIAGNOSIS:  1. Chronic right shoulder impingement.  2. Right shoulder symptomatic acromioclavicular joint arthrosis.   POSTOP DIAGNOSIS:  1. Chronic right shoulder impingement.  2. Right shoulder symptomatic acromioclavicular joint arthrosis.  3. Partial articular rotator cuff tear.  4. Type 3 superior labral tear.   PROCEDURE:  1. Right shoulder examination under anesthesia.  2. Right shoulder diagnostic arthroscopy.  3. Debridement of partial-articular rotator cuff tear.  4. Debridement of type 3 superior labral tear.  5. Arthroscopic subacromial depression and bursectomy.  6. Arthroscopic distal clavicle resection.   SURGEON OF RECORD:  Vania Rea. Supple, M.D.   Threasa HeadsFrench Ana A. Shuford, P.A.-C.   ANESTHESIA:  General endotracheal as well as a preop interscalene block.   ESTIMATED BLOOD LOSS:  Minimal.   DRAINS:  None.   HISTORY:  Richard [name] is a 45 year old gentleman who injured his right  shoulder back in January of this year, and has had persistent right shoulder  pain, weakness, and limitations in motion.  With examination she had a  positive impingement sign as well as significant pain with attempts at  overhead elevation of the right arm.  Due to his ongoing pain and functional  limitations.  He is brought to the operating at this time for planned right  shoulder arthroscopy as described below.   Preoperatively I counseled Richard [name] on treatment options as well as risks  versus benefits thereof; and the possible surgical complications of  bleeding, infection, neurovascular injury, persistent pain, loss of motion,  anesthetic complication, and the possible need for additional surgery were  reviewed.  He understands, accepts, and agrees with our planned procedure.   PROCEDURE IN DETAIL:  After undergoing routine preop evaluation, the patient  received prophylactic antibiotics; and an interscalene block was established  in the holding area by the anesthesia department.  He was placed supine on  the operating table and underwent a smooth induction of a general  endotracheal anesthesia.  He was turned to the left lateral decubitus  position on the beanbag and appropriately padded and protected.   Right shoulder examination under anesthesia revealed some mild restrictions  in mobility and a gentle manipulation was performed although there was no  significant palpable or audible release of adhesions.  Right arm was then  suspended in the 70/30 position with 10 pounds of traction.  The right  shoulder girdle region was sterilely prepped and draped in standard fashion.  A posterior portal established into the glenohumeral joint; and an anterior  portal was established under direct visualization.  The glenohumeral  articular surfaces were in good condition.  There was a partial articular  rotator cuff  tear which was debrided with a shaver.  I estimated that  approximately 10-50% of the thickness of the tendon was involved.  There was  also a very small tear of the superior labrum extending through the biceps  consistent with type 3 slap lesion.  This tear was debrided with the shaver  as well.  There are no obvious instability patterns.   At this point, the biceps tendon showed normal caliber; and was in good  condition.  The biceps anchor was stably attached to the superior glenoid.  Final inspection and irrigation was then completed, the __________ and  instruments were removed.  The arm was dropped down to 30 degrees abduction  with the arthroscope introduced into the subacromial space through the   posterior portal and a direct lateral portal was established in the  subacromial space.   Abundant proliferative bursal tissue and multiple lesions were identified;  and these were removed in combination with the shaver and the Arthrex wand.  The wand was then used to remove the periosteum from the inner surface of  the anterior half of the acromion.  Then a subacromial decompression was  performed with a bur creating a type 1 morphology.  A portal was then  established directly anterior to the distal clavicle and the distal clavicle  resection was performed with a bur.  Care was taken to ensure that the  entire circumference of the distal clavicle could be visualized to ensure  adequate removal of bone.  The subacromial bursectomy was then completed.  Hemostasis was obtained.   We carefully inspected and probed the bursal surface of the rotator cuff,  and did not appreciate any obvious full-thickness defects or significant  attenuation of the rotator cuff.  At this point the fluid and instruments  were removed.  The portal was closed with #0 Monocryl and Steri-Strips.  A  bulky dry dressing taped about the right shoulder; and the right arm was  placed into a sling immobilizer.  The patient was then extubated and taken  to the recovery room in stable condition.      Vania Rea. Supple, M.D.  Electronically Signed     KMS/MEDQ  D:  05/16/2006  T:  05/16/2006  Job:  [id number]

## 2010-12-02 NOTE — Group Therapy Note (Signed)
NAMESONG, GARRIS                ACCOUNT NO.:  [id number]   MEDICAL RECORD NO.:  [id number]          PATIENT TYPE:  INP   LOCATION:  A220                          FACILITY:  [facility]   PHYSICIAN:  Edward L. Juanetta Gosling, M.D.DATE OF BIRTH:  1966/01/02   DATE OF PROCEDURE:  11/12/2006  DATE OF DISCHARGE:                                 PROGRESS NOTE   Mr. Mierzwa came in yesterday with chest pain.  He says he feels better  now.  He also says he has had some trouble swallowing.   PHYSICAL EXAMINATION THIS MORNING:  VITAL SIGNS:  Temperature 97.9,  pulse 60, respirations 20, blood pressure 115/60, O2 saturation 96%.  GENERAL:  He is pain free now, and he is able to eat.   Cardiac panel thus far negative.   ASSESSMENT:  He has chest discomfort.  He has had a previous cardiac  catheterization that was not terribly abnormal, but not perfectly  normal, and I am going to see about having a cardiology consultation.  Because he had a fairly sudden onset of this, I am going to have him go  ahead and get a CT chest as well.      Edward L. Juanetta Gosling, M.D.  Electronically Signed     ELH/MEDQ  D:  11/12/2006  T:  11/12/2006  Job:  [id number]

## 2010-12-02 NOTE — Discharge Summary (Signed)
[place]  Patient:    Richard [name], Richard [name]                       MRN: [id number] Adm. Date:  [id number] Disc. Date: [id number] Attending:  Talitha Givens Dictator:   Tereso Newcomer, P.A. CC:         Luis Abed, M.D. [facility]             Shaune Pollack, M.D., [place], [place]                           Discharge Summary  DATE OF BIRTH:  [birth year]  DISCHARGE DIAGNOSES: 1. Chest pain, status post cardiac catheterization.  Left main normal, left    anterior descending 25% mid, left circumflex normal, ramus (high obtuse    marginal) 50-60%, right coronary artery proximal 20%, ejection fraction    62%, ______ limiting coronary artery disease.  Cardiac enzymes negative    x 3. 2. Tobacco abuse. 3. Hypertension. 4. Positive family history of coronary artery disease.  HISTORY OF PRESENT ILLNESS:  This 45 year old white male with no prior cardiac history had a three- to four-week history of occasional chest pain and left upper extremity discomfort prior to admission.  At work on the day of admission the pain developed again in the left upper extremity.  It was described as a dull feeling.  The night prior to admission the pain lasted for about an hour, and then he developed chest pain across his entire upper chest region.  His pain was eventually relieved with aspirin and nitroglycerin x 2 at [facility].  EKG there showed no acute ST and T-wave abnormalities.  He was subsequently transferred to [facility] for cardiac catheterization.  PHYSICAL EXAMINATION:  GENERAL:  Well-nourished male, in no apparent distress.  VITAL SIGNS:  Heart rate 68, blood pressure 126/84.  LUNGS:  Clear to auscultation.  CARDIAC:  Regular rate and rhythm, with no murmurs.  EXTREMITIES:  With 2+ pulses.  NEUROLOGIC:  No focal deficits.  INITIAL LABORATORY DATA:  Sodium 137, potassium 3.8, chloride 97, CO2 28, BUN 18, creatinine 1.0, glucose 126.   Hemoglobin 17, hematocrit 50.5, WBC 10.8, platelet count 267.  Initial troponin I 0.0, CPK 67, CK-MB 1.  HOSPITAL COURSE:  Given the patients symptoms, in addition to his strong risk factors with a history of hypertension, positive tobacco history, and strong family history for coronary artery disease, it was felt that proceeding towards cardiac catheterization was warranted.  The patient remained stable. His cardiac enzyme history at [facility] revealed CK 58, MB 0.4, troponin I less than 0.03.  CK #2 48, CK-MB 0.4, troponin I less than 0.03.  The patient also had a lipid profile performed during his stay.  His total cholesterol was 175, triglycerides 224, HDL 35, LDL 97.  The patient had no further chest pain throughout his admission.  On October 13, 1999, he went to the cardiac catheterization laboratory.  The results are as noted above.  He tolerated the procedure well.  Examination prior to discharge revealed stable groin with no evidence of hematoma or bruits.  It was felt the patient was stable enough for discharge in the afternoon on October 13, 1999.  DISCHARGE MEDICATIONS: 1. Toprol 50 mg 1-1/2 tablets q.d. 2. Enteric-coated aspirin 325 mg q.d. 3. Nitroglycerin 0.4 mg sublingual p.r.n. chest pain.  ACTIVITY:  The patient is  to refrain from driving, heavy lifting, sex, or exertional activity for three days.  DIET:  He is to follow a strict low fat, low cholesterol diet.  His cholesterol results have been given to him.  He should have follow-up lipid profile done in about six months.  WOUND CARE:  He should watch his groin for any increased swelling, redness, or bleeding, and call our office with concerns.  FOLLOW-UP:  With Dr. Junita Push P.A. on October 24, 1999, at 11 a.m. in [place].  He should follow up with Dr. ______ in the future after this initial appointment.  He should also make an appointment with Dr. Shaune Pollack in two to three weeks to follow up on hypertension and  cholesterol management. D:  10/13/99 TD:  10/14/99 Job: [id number]

## 2010-12-02 NOTE — Procedures (Signed)
   NAME:  OPIE, MACLAUGHLIN                          ACCOUNT NO.:  [id number]   MEDICAL RECORD NO.:  [id number]                   PATIENT TYPE:   LOCATION:                                       FACILITY:   PHYSICIAN:  Vida Roller, M.D.                DATE OF BIRTH:  1966/01/06   DATE OF PROCEDURE:  DATE OF DISCHARGE:                                    STRESS TEST   INDICATIONS:  The patient is a 45 year old male with known non-obstructive  coronary artery disease by catheterization in 2001.  He now presents with  atypical chest discomfort.   BASELINE DATA:  EKG shows sinus rhythm at 54 beats per minute with blood  pressure 128/80.  The patient exercised for a total of 10 minutes and 18  seconds to 12.0 METS.  The patient exercised to Bruce protocol stage IV.  Speed was reduced secondary to leg pain for the last minute of exercise.  The patient did complain of mild shortness of breath and bilateral knee and  ankle pain.  He denied any chest discomfort.  Maximum heart rate was 155  beats per minute which is 85% of predicted maximum.  Maximum blood pressure  is 198/88.  EKG showed no arrhythmias and no ischemic changes.  Cardiolite  was injected.  Final images and results are pending M.D. review.     Amy Mercy Riding, P.A. [facility]                     Vida Roller, M.D.    AB/MEDQ  D:  10/01/2002  T:  10/01/2002  Job:  [id number]

## 2010-12-02 NOTE — Letter (Signed)
February 26, 2006     Ramon Dredge L. Juanetta Gosling, MD  [address]  [place], [place] [address]   RE:  OAKLAN, PERSONS  MRN:  [id number]  /  DOB:  March 31, 1966   Dear Renae Fickle,   Mr. Pilz was re-evaluated in the office today in consultation at your  request.  As you know, this nice gentleman saw Dr. Dorethea Clan in 2005 after an  episode of severe chest discomfort that sounded ischemic.  Coronary  angiography revealed 20-40% scattered lesions without an apparent  explanation for his symptoms.  He was advised to discontinue cigarette  smoking and treated medically.  His lipid-lowering therapy has been lost  somewhere along the way.  He did stop smoking, but gained 60 pounds  prompting him to resume cigarette consumption.   More recently, he has noted episodic lightheadedness.  This is related to  his work at [employer], which involves unloading trucks.  He frequently  moves from a warm environment to the air conditioned store.  Generally rest  allows his dizziness to pass.   He has also had chest discomfort.  He describes this as mild heaviness that  is noted most when he awakens in the morning and gradually resolved during  the day.  There is no clear exertional precipitant.   CURRENT MEDICATIONS INCLUDE:  Toprol 75 mg daily, aspirin 325 mg daily,  Nexium 40 mg daily, Lortab 10/500 mg q.i.d., Xanax 5 mg daily, Tylenol  p.r.n.   PAST MEDICAL HISTORY:  Notable for the development of a fatty liver when he  was gaining weight.  He has not undergone surgery.  HE REPORTS AN ALLERGY TO  PENICILLIN THAT INCLUDED EDEMA AND DIFFICULTY BREATHING.   SOCIAL HISTORY:  Works in [employer] as noted above.  Married a few days ago,  which is his second marriage.  He has 5 children who are alive and well.   FAMILY HISTORY:  Father is a patient of mine with advanced coronary disease.  His mother died at age 20 due to neoplastic disease.  There is no prominent  intravascular disease among 8 siblings.   REVIEW OF  SYSTEMS:  Notable for occasional headaches, the need for  corrective lenses, occasional palpitations, GERD symptoms, a history of  ulcer disease, urinary frequency.  All other systems reviewed and are  negative.   PHYSICAL EXAMINATION:  GENERAL:  On exam, pleasant, trim gentleman in no  acute distress.  VITAL SIGNS:  The weight is 178, blood pressure 100/60, heart rate 60 and  regular, respirations 16.  NECK:  No jugular venous distension; normal carotid upstrokes without  bruits.  LUNGS:  Clear.  CARDIAC:  Normal first and second heart sounds; fourth heart sound present;  normal PMI.  ABDOMEN:  Soft and nontender; no masses; no organomegaly.  EXTREMITIES:  Distal pulses intact; no edema.  NEUROMUSCULAR:  Symmetric strength and tone; normal cranial nerves.  MUSCULOSKELETAL:  No joint deformities.  Some difficulty with the range of  motion of his right shoulder.   EKG:  Sinus bradycardia; within normal limits.  J-point elevation.   IMPRESSION:  Mr. Follette presents with vague and atypical chest discomfort,  as well as episodic dizziness that appears related to excessive work  environments with variable temperature.  I doubt that he is experiencing  myocardial ischemia or other cardiac symptoms.  Since it has been more than  2 years since catheterization and surgery is planned, we will proceed with a  treadmill exercise test.  If results  are good, I would not perform any other  presurgical testing other than routine laboratories.  We will reinstitute  lipid-lowering therapy and continue to monitor this nice gentleman for risk  factor optimization.    Sincerely,      Gerrit Friends. Dietrich Pates, MD, [facility]   RMR/MedQ  DD:  02/26/2006  DT:  02/27/2006  Job #:  [id number]   CC:    Vania Rea. Supple, MD

## 2010-12-02 NOTE — Cardiovascular Report (Signed)
[place]. [facility]  Patient:    Richard [name], Richard [name]                       MRN: [id number] Proc. Date: 10/13/99 Adm. Date:  [id number] Disc. Date: [id number] Attending:  Talitha Givens CC:         Kari Baars, M.D.             Luis Abed, M.D. [facility]                        Cardiac Catheterization  PROCEDURES PERFORMED: 1. Left heart catheterization. 2. Selective coronary angiography. 3. Ventriculography.  DIAGNOSES: 1. No flow-limiting coronary artery disease. 2. Normal LV systolic function.  HISTORY: [name] is a 45 year old male, seen by Dr. Juanetta Gosling and Dr. Myrtis Ser. He was admitted with substernal chest pain.  The patient has multiple risk factors for coronary artery disease, and particularly a very strong family history of coronary artery disease.  The patient was referred for diagnostic catheterization to assist his coronary anatomy.  DESCRIPTION OF PROCEDURE:  After informed consent was obtained, the patient was  brought to the catheterization laboratory.  The right groin was sterilely prepped and draped.  Lidocaine 1% was used to infiltrate the right groin.  A 6-French arterial sheath was placed using modified Seldinger technique.  Subsequently 6-French JL4 and JR4 catheters were used to engage the left and right coronary arteries.  The JR4 catheter was exchanged for a No-Torque catheter in order to selectively engage the right coronary artery.  Selective angiography was performed in various projections using manual injections of contrast.  After coronary angiography, attention was turned to ventriculography.  A 6-French pigtail catheter was advanced via the femoral artery to the left ventricle. Appropriate left-sided hemodynamics are obtained.  The left ventriculogram was hen performed using power injections of contrast.  Subsequently the pigtail catheter was pulled back across the aortic valve and eventually removed.  At  the termination of the case the catheter and sheath were removed and manual pressure applied until adequate hemostasis was achieved.  The patient tolerated the procedure well and was transferred to the floor in stable condition.  FINDINGS:  LEFT HEART CATHETERIZATION/SELECTIVE CORONARY ANGIOGRAPHY: 1. LEFT MAIN CORONARY ARTERY:  Widely patent, large-caliber vessel, but short. 2. LEFT ANTERIOR DESCENDING ARTERY:  This was a large-sized vessel, with no    evidence of flow-limiting disease; although there was 20% area of plaquing    in the mid LAD.  Two large diagonal vessels are arising from the LAD.  There    is no flow-limiting disease in the diagonal vessels. 3. RAMUS INTERMEDIUS:  Although it is really a very high obtuse marginal branch,    appears to have in its proximal portion a 50-60% stenosis.  This is,    however, a fairly small vessel. 4. RIGHT CORONARY ARTERY:  A large-caliber vessel with minor coronary plaquing n    its proximal segment (estimated at 10-20%).  VENTRICULOGRAPHY:  This was performed from single-plane RAO projection. Ejection fraction was quantitatively calculated.  Ejection fraction was 52% with no wall  motion abnormalities.  CONCLUSIONS: 1. Minor coronary plaquing with 50-60% stenosis of a small ramus branch. 2. Normal LV systolic function.  RECOMMENDATIONS:  Angiographs were reviewed with Dr. Riley Kill.  Although the patient has a 50-60% lesion in the proximal portion of the ramus, this is a rather small vessel and would not be very amenable  to percutaneous coronary intervention. Furthermore, the patient has no significant coronary disease in his major epicardial vessels.  Risk factor modification is indicated.  I have discussed this with the patient,  particularly withholding tobacco use and eating a low-fat, low-cholesterol diet, as well as increasing his exercise on a daily basis.  Results will be communicated  with Dr. Myrtis Ser.  ______ has  been discontinued.  Will continue the patient on aspirin and beta blocker.  Will likely need lipid-lowering therapy.  He can then follow up with Dr. Juanetta Gosling as well as Dr. Myrtis Ser on an as needed basis.   It is anticipated that the patient will be discharged later today. DD:  10/13/99 TD:  10/14/99 Job: [id number]

## 2010-12-02 NOTE — H&P (Signed)
NAME:  Richard [name], Richard [name]                          ACCOUNT NO.:  [id number]   MEDICAL RECORD NO.:  [id number]                   PATIENT TYPE:  INP   LOCATION:  A227                                 FACILITY:  [facility]   PHYSICIAN:  Edward L. Juanetta Gosling, M.D.             DATE OF BIRTH:  06/07/1966   DATE OF ADMISSION:  10/27/2003  DATE OF DISCHARGE:                                HISTORY & PHYSICAL   REASON FOR ADMISSION:  Chest pain.   HISTORY:  Richard [name] is a 45 year old who has a history of chest pain.  He  had had a previous cardiac catheterization done about 3 or 4 years ago that  was without obstructive disease, but on the day of admission, he developed  chest discomfort while he was sitting inactive and this was a pressure-like  sensation in his chest, then went into his neck, causing him to become short  of breath and nauseated, and he eventually came to the emergency room.  He  was given nitroglycerin in the emergency room and it did improve his  situation.  He is now admitted because of the pain.  As mentioned, he has  had a previous cardiac catheterization which did not show anything that  appeared to be obstructive coronary disease.  He was admitted for further  evaluation.   PAST MEDICAL HISTORY:  His past medical history is positive for:  1. Gastroesophageal reflux disease.  2. Irritable bowel syndrome.  3. Scoliosis.  4. Hypertension.  5. Anxiety.   MEDICATIONS:  He has been on a number of medications at home but I do not  have the doses yet; that will be dictated later.   FAMILY HISTORY:  His father has had a history of abdominal aortic aneurysm  and diabetes mellitus.  Mother died in her 62s with lung cancer.  He has  another brother who has diabetes.   SOCIAL HISTORY:  He smokes about a package of cigarettes daily and has for  about 20 years.  He does not use any alcohol.   REVIEW OF SYSTEMS:  Review of systems, except as mentioned, is essentially  negative.  He has  had no leg pain or edema, no cough or sputum production,  except for what he describes as a chronic smoker's cough.   PHYSICAL EXAM:  GENERAL:  Physical exam shows a well-developed, well-  nourished male who does not appear to be in any acute distress.  VITAL SIGNS:  His blood pressure 130/70, pulse of 60, respirations are 18,  he is afebrile.  HEENT:  His HEENT shows his pupils are equal, round, react to light and  accommodation; nose and throat are clear.  NECK:  His neck is supple without masses.  CHEST:  His chest is clear without wheezes, rales or rhonchi.  HEART:  Heart is regular without murmur, gallop or rub.  ABDOMEN:  His abdomen is soft.  EXTREMITIES:  Extremities show no edema and no tenderness.   ASSESSMENT:  He has chest discomfort.  It is not quite clear what this is  from.  Certainly, his history was pretty good for some sort of a cardiac  problem.  His enzymes are thus far negative.  He probably has some chronic  obstructive pulmonary disease as well.   PLAN:  My plan then would be to go ahead with current medications.  He is on  Lovenox.  I am going to ask for a consultation with the [facility]  team, who had done his previous cardiac catheterization.     ___________________________________________                                         Oneal Deputy Juanetta Gosling, M.D.   ELH/MEDQ  D:  10/28/2003  T:  10/28/2003  Job:  [id number]

## 2010-12-02 NOTE — Discharge Summary (Signed)
Richard [name], Richard [name]                          ACCOUNT NO.:  [id number]   MEDICAL RECORD NO.:  [id number]                   PATIENT TYPE:  INP   LOCATION:  3731                                 FACILITY:  [facility]   PHYSICIAN:  Veneda Melter, M.D.                   DATE OF BIRTH:  [birth year]   DATE OF ADMISSION:  10/29/2003  DATE OF DISCHARGE:  10/29/2003                           DISCHARGE SUMMARY - REFERRING   DISCHARGE DIAGNOSES:  1. Chest pain, resolved.  2. Nonobstructive coronary artery disease.  3. Hiatal hernia.  4. Chronic back pain.  5. Hyperlipidemia, treated.  6. Gastroesophageal reflux disease.  7. Scoliosis.  8. Hypertension.  9. Early family history of coronary artery disease.   HOSPITAL COURSE:  Richard [name] is a 45 year old male patient with a known  history of coronary artery disease, nonobstructive, who presented to [facility] complaining of anterior chest pressure with sudden onset  associated with shortness of breath and nausea and diaphoresis.  This  occurred at rest.  His cardiac enzymes were negative x3.  However, since he  is a smoker and he has a strong family history, it was imperative that he  have a cardiac catheterization.  He was transferred to [facility] and underwent cardiac catheterization on October 30, 2003,  and was found to have nonobstructive coronary artery disease with a normal  EF.  At this point, we reinforced smoking cessation, diet and exercise.   The patient will continue the same medications as prior to admission which  include:  1. Nexium 40 mg a day.  2. Crestor 10 mg a day.  3. Aspirin 325 mg a day.  4. Toprol XL 75 mg a day.  5. Ativan and Vicodin as needed.  6. Tylenol one to two tablets q.4-6h. as needed for pain.   No driving, lifting, or straining for two days, then gradually increase  activity.  Remain on a low fat diet.  Clean over catheterization site with  soap and water.  No scrubbing.  Offer  questions or concerns and needs to see  Dr. Juanetta Gosling in one to two weeks.  He should call for an appointment.      Guy Franco, P.A. [facility]                      Veneda Melter, M.D.    LB/MEDQ  D:  12/07/2003  T:  12/08/2003  Job:  [id number]

## 2010-12-02 NOTE — Consult Note (Signed)
NAME:  Richard [name], Richard [name]                          ACCOUNT NO.:  [id number]   MEDICAL RECORD NO.:  [id number]                   PATIENT TYPE:  INP   LOCATION:  A227                                 FACILITY:  [facility]   PHYSICIAN:  [place] Bing, M.D.               DATE OF BIRTH:  [birth year]   DATE OF CONSULTATION:  DATE OF DISCHARGE:                                   CONSULTATION   PRIMARY CARDIOLOGIST:  Vida Roller, M.D.   HISTORY OF PRESENT ILLNESS:  A 45 year old gentleman with nonobstructive  coronary disease on a prior cardiac catheterization admitted with chest  pain. Richard [name] has multiple cardiovascular risk factors including  hyperlipidemia, hypertension, ongoing cigarette smoking, and a strong  positive family history--his father is a patient of mine who has previously  undergone CABG surgery. Richard [name] presented in 2001 with chest discomfort  and underwent cardiac catheterization. He had scattered insignificant  disease in the LAD and RCA, plus a 60% in a small marginal branch of the  circumflex. Medical therapy was advised. He has had intermittent chest  discomfort over the years attributed to GI origin. He has been treated with  a PPI with benefit. On the evening of admission, he experienced sharp,  moderately severe anterior chest discomfort of sudden origin associated with  chest soreness and difficulty taking a deep breath--he did not have true  dyspnea. This was associated with mild diaphoresis. There was no radiation.  He had nitroglycerin, but did not think to use it. The pain was persistent  until he came to the emergency department and was treated with sublingual  nitroglycerin, which provided relatively rapid relief. He believes that the  quality of his chest discomfort is different than it has been in the past.  He underwent a stress Cardiolite study in March 2004 revealing no ischemia,  normal left ventricular systolic function. He has been treated with  rosuvastatin, but has not had a lipid profile on treatment that we can  locate. Hypertension has apparently been under good control. He reports  tapering cigarette smoking, but not discontinuing it entirely. Total  consumption is approximately 20 pack years.   Recent medications include Nexium 40 mg daily, rosuvastatin 10 mg daily,  aspirin 325 mg daily, metoprolol 75 mg daily, Ativan p.r.n., and Vicodin  daily.   Past medical history is otherwise notable for GERD, IBS, scoliosis, history  of  peptic ulcer disease, and chronic back pain. He has never undergone  surgical procedure.   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 He has had no exercise-  related symptoms in the past. Denies excessive use of alcohol.   FAMILY HISTORY:  Father first suffered a myocardial infarction at age 40.  Mother died due to carcinoma of the lung. Has four brothers and two sisters.  Among his sisters there  are two with diabetes and one who suffered a  myocardial infarction at age 63.   REVIEW OF SYSTEMS:  Notable for chronic mild cough; occasional palpitations;  urinary frequency; DJD of the lumbosacral spine with back pain; arthralgias  of his hands attributed to carpal tunnel.   PHYSICAL EXAMINATION:  GENERAL: A well-appearing gentleman in good  condition.  VITAL SIGNS: Temperature is 97.5, heart rate 60 and regular, respirations  16, blood pressure 115/60, weight 208 pounds. Oxygen saturation 97% on two  liters.  HEENT: Anicteric sclerae.  NECK: No jugular venous distention; no carotid bruits.  ENDOCRINE: No thyromegaly.  HEMATOPOIETIC: No adenopathy.  CARDIAC: Normal first and second heart sounds; grade 1-2/6 holosystolic  murmur between the left sternal border and the apex.  LUNGS: Clear.  ABDOMEN: Soft and nontender; no organomegaly.  EXTREMITIES: Normal distal pulses except for decreased left dorsalis  pedis.  MUSCULOSKELETAL: No joint deformities.  NEUROMUSCULAR: Symmetric strength and tone.  SKIN: No significant lesions.   Chest x-ray: NAD.   EKG: Sinus rhythm at a rate of 89; nondiagnostic inferior Q-waves; minimal  inferior ST segment depression; voltage criteria for left ventricular  hypertrophy. Then a subsequent tracing revealed IVCD.   Follow up of EKG this morning: Sinus bradycardia; within normal limits.   IMPRESSION:  Richard [name] has very significant risk factors and known coronary  artery disease. His symptoms on admission were impressive. Relief with  sublingual nitroglycerin plus EKG abnormalities on presentation, albeit  subtle, are consistent with myocardial ischemia. While coronary spasm is a  possibility, it is likely that he has had progression of known coronary  disease. I have recommended that he proceed with coronary angiography after  transfer to [facility]. Treatment with beta blocker and enoxaparin  will be continued. Intravenous nitroglycerin will be added if he experiences  recurrent symptoms. Aspirin has been ordered and will be continued.      ___________________________________________                                            [place] Bing, M.D.   RR/MEDQ  D:  10/28/2003  T:  10/28/2003  Job:  [id number]

## 2010-12-02 NOTE — Procedures (Signed)
NAMEJOHNPAUL, GILLENTINE                            ACCOUNT NO.:  [id number]   MEDICAL RECORD NO.:  [id number]                  PATIENT TYPE:   LOCATION:                                       FACILITY:   PHYSICIAN:  Edward L. Juanetta Gosling, M.D.             DATE OF BIRTH:   DATE OF PROCEDURE:  DATE OF DISCHARGE:                                EKG INTERPRETATION   TIME:  October 27, 2003, at 2138.   FINDINGS:  1. The rhythm is sinus rhythm with a rate in the 90's.  2. Small Q waves are seen inferiorly, but these are small.  3. There are nonspecific ST-T wave changes.   IMPRESSION:  Minimally abnormal electrocardiogram.      ___________________________________________                                            Oneal Deputy. Juanetta Gosling, M.D.   ELH/MEDQ  D:  11/02/2003  T:  11/02/2003  Job:  [id number]

## 2010-12-02 NOTE — Discharge Summary (Signed)
NAME:  Richard [name], Richard [name]                          ACCOUNT NO.:  [id number]   MEDICAL RECORD NO.:  [id number]                   PATIENT TYPE:  INP   LOCATION:  A227                                 FACILITY:  [facility]   PHYSICIAN:  Edward L. Juanetta Gosling, M.D.             DATE OF BIRTH:  1965-12-08   DATE OF ADMISSION:  10/27/2003  DATE OF DISCHARGE:                                 DISCHARGE SUMMARY   FINAL DISCHARGE DIAGNOSES:  1. Chest pain, probable unstable angina pectoris.  2. Hypertension.  3. Anxiety.  4. Probable early chronic obstructive pulmonary disease.   HISTORY:  [name] is a 45 year old who came to the emergency room on the  day of admission with chest pain.  His chest pain was midsternal, caused him  to have a sensation of pressure in his chest and it lasted until he received  nitroglycerin x3 in the emergency room.  When he was given the  nitroglycerin, it did help, but he still had pain which totally resolved, in  essence, spontaneously.  His cardiac enzymes initially were negative and he  was admitted and started on Lovenox, given O2, etc.  He was also given  aspirin in the emergency room.  He has had a previous cardiac  catheterization done about 4-5 years ago.  It showed nonobstructive coronary  disease.   PHYSICAL EXAMINATION:  His physical exam shows his blood pressure 128/80,  pulse 80, respirations 18.  He was afebrile.  His pupils are reactive.  Chest fairly clear.  Heart regular.  He did not have any chest wall  tenderness.   HOSPITAL COURSE:  Cardiac enzymes were negative for infarction.  He had  consultation with the [facility] Cardiology team who felt that he should have  further evaluation with repeat cardiac catheterization and he was  transferred to [facility] on October 29, 2003 for that to take place.     ___________________________________________                                         Oneal Deputy. Juanetta Gosling, M.D.   ELH/MEDQ  D:  10/28/2003  T:   10/29/2003  Job:  [id number]

## 2010-12-02 NOTE — Consult Note (Signed)
NAMEKENNEN, STAMMER                ACCOUNT NO.:  [id number]   MEDICAL RECORD NO.:  [id number]          PATIENT TYPE:  INP   LOCATION:  A220                          FACILITY:  [facility]   PHYSICIAN:  Lionel December, M.D.    DATE OF BIRTH:  [birth year]   DATE OF CONSULTATION:  DATE OF DISCHARGE:  11/12/2006                                 CONSULTATION   REQUESTING PHYSICIAN:  Dr. Juanetta Gosling.   REASON FOR CONSULTATION:  Chest pain/dysphagia.   HISTORY OF PRESENT ILLNESS:  Mr. Mackowski is a 45 year old Caucasian male  who was eating a steak about 9 p.m. yesterday.  He developed  retrosternal chest pressure about 25 minutes later along with a feeling  as if he was going to pass out, which is what brought him to the  emergency room.  Pain was 2/10 and constant.  The patient had also had  pizza at lunch, hotdogs for dinner and steak prior to the onset.  He  takes Nexium 40 mg daily, but needs a second dose a couple of days a  week or uses Tums p.r.n. for heartburn and indigestion.  He complains of  a significant amount of water brash.  He denies any anorexia or early  satiety.  Pain resolved post awakening about 6 a.m., status post  Protonix and nitroglycerin in the ER.  He has gained about 24 pounds in  the last 4 months, but he denies any dysphagia or odynophagia at this  point.  He denies any rectal bleeding or melena.  He has a chest CT scan  which is pending.  He has been evaluated by Cardiology and has had 3  negative troponins thus far.   PAST MEDICAL AND SURGICAL HISTORY:  1. Chronic GERD, erosive reflux esophagitis and ulcerative esophagitis      with last EGD with Dr. Karilyn Cota on October 02, 2001.  He also had      nonerosive gastritis of the antrum.  His H. pylori serology was      negative.  2. He also had an abdominal ultrasound on Dec 03, 2003 which showed      stable fatty infiltration of the liver with large bearing      posteriorly on the right and was otherwise a normal exam.  3.  He has history of hypertension, nonobstructing coronary disease,      arthritis, depression, hypercholesterolemia and status post right      shoulder surgery.   CURRENT MEDICATIONS PRIOR TO ADMISSION:  1. Toprol 75 mg daily.  2. Nexium 40 mg daily.  3. Tums p.r.n.  4. Aspirin 325 mg daily.   FAMILY HISTORY:  No known family history of colorectal carcinoma or  other chronic GI problems.  Mother deceased at age 71 secondary to lung  carcinoma, father alive and healthy.  He has multiple healthy siblings  with history of coronary artery disease.   SOCIAL HISTORY:  Mr. Emberton has been married for 8 months.  He has 5  healthy children.  He is on disability and previously he worked at [employer].  He has a  24-pack-year history of tobacco use.  He denies any  alcohol or drug use.   REVIEW OF SYSTEMS:  CONSTITUTIONAL:  Weight is up 24 pounds in the last  4 months.  He denies any fever or chills.  He consumes about 2-1/2 L of  caffeinated beverages daily.  CARDIOVASCULAR:  See HPI.  Denies any  palpitation.  PULMONARY:  Denies any shortness of breath, dyspnea, cough  or hemoptysis.  GI:  See HPI.   PHYSICAL EXAMINATION:  VITAL SIGNS:  Weight is 99 kg, height 71 inches.  Temperature 97.9, pulse 60, respirations 20, blood pressure 115/60.  GENERAL:  Mr. Radermacher is an obese Caucasian male who is alert, oriented,  pleasant and cooperative, in no acute distress.  HEENT.  Sclerae are  clear, anicteric.  Conjunctivae pink.  Oropharynx pink and moist without  any lesions.  NECK:  Supple without any masses or thyromegaly.  CHEST:  Heart regular rate and rhythm, normal S1 and S2, without  murmurs, rubs or gallops.  Lungs are clear to auscultation bilaterally.  ABDOMEN:  Positive bowel sounds x4.  No bruits auscultated.  Soft,  nontender and non-distended, without palpable mass or hepatomegaly.  No  rebound tenderness or guarding.  Exam is limited, given the patient's  body habitus.  EXTREMITIES:   Without clubbing or edema bilaterally.  SKIN:  Pink, warm and dry without any rash or jaundice.   LABORATORY AND ACCESSORY CLINICAL DATA:  Laboratory studies from April  27:  WBC 7.3, hemoglobin 16.2, hematocrit 46.5, platelets 263,000.  Calcium 9.2, sodium 139, potassium 3.7, chloride 105, CO2 27, BUN 16,  creatinine 1.09, glucose 132, total bilirubin 0.4, alkaline phosphatase  42, AST 28, ALT is 51, total protein 6.7, albumin 4.0.  Troponin is  negative x3 and total CK is negative.   IMPRESSION:  Mr. Vester is a 45 year old obese Caucasian male with a 9-  hour history of retrosternal chest pain, which he describes as pressure,  and presyncope.  He is being evaluated by Cardiology to rule out  coronary artery disease.  He has a chest CT which is pending to rule out  pulmonary etiology.  His pain seems to be related to eating with  possible connection to his history of chronic gastroesophageal reflux  disease with refractory symptoms, possible erosive reflux esophagitis,  heartburn or it is even possible that he may have had a food bolus which  passed spontaneously.  I wonder if he had a vasovagal response, given  his presyncope symptoms.  He has not been taking proton pump inhibitor  as directed.  His recent weight, diet and lifestyle are definitely  contributing to his refractory symptoms.  He denies any dysphagia at  this time.  He feels 100% better at this point and tells me he is ready  to go home.   PLAN:  1. Would resume b.i.d. PPI.  2. GERD diet and literature given.  3. Reviewed GERD precautions at length with Mr. Maselli.  4. Gradual weight loss for a goal of less than 180 pounds.  We will      follow up with chest CT, await complete cardiac workup and if no      improvement on b.i.d. PPI, would consider further evaluation.   We would like to thank Dr. Juanetta Gosling for allowing [name] to participate in the  care of Mr. Josue Hector, N.P.      Lionel December, M.D.  Electronically Signed  KC/MEDQ  D:  11/12/2006  T:  11/13/2006  Job:  [id number]   cc:   Ramon Dredge L. Juanetta Gosling, M.D.  Fax: [phone]

## 2010-12-02 NOTE — Procedures (Signed)
NAME:  Richard [name], Richard [name]                          ACCOUNT NO.:  [id number]   MEDICAL RECORD NO.:  [id number]                   PATIENT TYPE:  INP   LOCATION:  A227                                 FACILITY:  [facility]   PHYSICIAN:  Edward L. Juanetta Gosling, M.D.             DATE OF BIRTH:  10-28-1965   DATE OF PROCEDURE:  DATE OF DISCHARGE:                                EKG INTERPRETATION   TIME:  October 27, 2003, at 2138.   FINDINGS:  The rhythm is sinus rhythm with a rate of about 90.  There are  nonspecific ST-T wave changes.   IMPRESSION:  Minimally abnormal electrocardiogram.      ___________________________________________                                            Oneal Deputy. Juanetta Gosling, M.D.   ELH/MEDQ  D:  10/28/2003  T:  10/28/2003  Job:  [id number]

## 2010-12-02 NOTE — Group Therapy Note (Signed)
NAMEMARQUE, RADEMAKER                ACCOUNT NO.:  [id number]   MEDICAL RECORD NO.:  [id number]          PATIENT TYPE:  INP   LOCATION:  A220                          FACILITY:  [facility]   PHYSICIAN:  Richard [name], M.D.DATE OF BIRTH:  08/02/1965   DATE OF PROCEDURE:  DATE OF DISCHARGE:  11/12/2006                                 PROGRESS NOTE   HISTORY OF PRESENT ILLNESS:  [name] was seen by the cardiologist and  it was felt that he did not have significant cardiac disease and did not  need any further work up but that this may well represent reflux.  He  had consultation with Dr. Dionicia Abler from GI who feels that he does not need  any acute workup right now.  We are going to go ahead and discharge him  home.  Please see discharge summary for details.      Richard [name], M.D.  Electronically Signed     ELH/MEDQ  D:  11/12/2006  T:  11/13/2006  Job:  [id number]

## 2010-12-02 NOTE — Cardiovascular Report (Signed)
NAMECARON, TARDIF                          ACCOUNT NO.:  [id number]   MEDICAL RECORD NO.:  [id number]                   PATIENT TYPE:  INP   LOCATION:  3731                                 FACILITY:  [facility]   PHYSICIAN:  Veneda Melter, M.D.                   DATE OF BIRTH:  01-Aug-1965   DATE OF PROCEDURE:  10/29/2003  DATE OF DISCHARGE:  10/29/2003                              CARDIAC CATHETERIZATION   PROCEDURES PERFORMED:  1. Left heart catheterization.  2. Left ventriculogram.  3. Elective coronary angiography.  4. Angio-Seal right femoral artery.   DIAGNOSES:  1. Mild coronary artery disease by angiogram.  2. Normal left ventricular systolic function.   HISTORY:  Mr. Swopes is a 45 year old gentleman with multiple cardiovascular  risk factors including ongoing tobacco use who presents with severe onset of  substernal chest discomfort with shortness of breath, nausea and  diaphoresis.  The patient presented to [facility] where he was  admitted.  He was stabilized medically and ruled out for acute myocardial  infarction.  He presents now in transfer for further cardiac assessment.   TECHNIQUE:  Informed consent was obtained.  Patient brought to the  catheterization lab.  A 6 French sheath placed in the right femoral artery  using modified Seldinger technique.  The 6 [place] and JR4 catheters were  then used to gauge the left and right coronary arteries and selective  angiography performed in various projections using manual injection of  contrast.  The 6 French pigtail catheter was then advanced to the left  ventricle and a left ventriculogram performed using __________ contrast.  At  the termination of the case, catheters and sheath were removed and an Angio-  Seal closure device deployed to the right femoral artery until adequate  hemostasis was achieved.  The patient tolerated the procedure well and was  transferred to the floor in stable condition.   FINDINGS ARE AS FOLLOWS:  1. Left main __________ medium caliber vessel with mild irregularities.  2. LAD is a large caliber vessel, provides two diagonal branches.  The LAD     has moderate concentric narrowing of 30-40% in the midsection.  The     remainder of the vessel has luminal disease.  The diagonal branch has     mild disease at 20%.  3. Left circumflex artery.  This large caliber vessel provides a small first     marginal branch of proximal segment and a large bifurcating second     marginal branch distally.  The AV circumflex has mild circumflex disease     at 20%.  The first marginal branch has an ostial and proximal narrowing     of 30 and 40%.  The second marginal branch has mild disease.  4. Right coronary artery.  Dominant large caliber vessel provides the     posterior ascending artery and posterior ventricular branch  terminal     segment.  The right coronary artery has mild disease of 20-30% in the     proximal and midsection.  The distal vessel has luminal irregularities.  5. LV.  Normal end-systolic and diastolic dimensions.  Overall left     ventricular function is well preserved.  Ejection fraction greater than     55%.  No mitral regurgitation.  LV pressures are 125/11, aortic was     120/76, LVEDP was 20.   ASSESSMENT AND PLAN:  Mr. Adelsberger is a 45 year old gentleman who presents  with chest discomfort of unclear etiology.  He has noncritical coronary  disease and well preserved LV function.  Continued medical therapy will be  recommended and other causes of pain investigated.                                               Veneda Melter, M.D.    NG/MEDQ  D:  10/29/2003  T:  11/01/2003  Job:  [id number]   cc:   Ramon Dredge L. Juanetta Gosling, M.D.  [address]  [place]  [place] [address]  Fax: [phone]   Vida Roller, M.D.  Fax: [phone]

## 2010-12-02 NOTE — Procedures (Signed)
[facility]                                EXERCISE TREADMILL   TALOR, CHEEMA                       MRN:          [id number]  DATE:02/26/2006                            DOB:          Jun 14, 1966    REFERRING PHYSICIANS:  Dr. Juanetta Gosling.  Dr. Francena Hanly.   CLINICAL DATA:  [age]-old gentleman with minimal coronary disease  at coronary angiography more than 2 years ago.  He now presents with  dizziness and chest discomfort prior to planned right shoulder surgery.   Treadmill exercise performed to a work load of 12 METS and a heart rate of  133, seventy-four percent of age-predicted maximum.  Exercise discontinued  due to fatigue, particularly leg fatigue; mild substernal chest pressure  along with dyspnea reported.   NOTE:  Blood pressure increased from a resting value of 120/75 to 180/80 at  peak exercise, a normal response.  No arrhythmias noted.   RESTING EKG:  Sinus bradycardia; prominent voltage; nondiagnostic inferior Q  waves; minor J-point elevation.   STRESS EKG:  One to 2 mm of upsloping ST segment depression in the inferior  leads as well as V4-V6.   IMPRESSION:  Somewhat submaximal stress test with good exercise tolerance  due to the use of beta blockers; no myocardial ischemia at submaximal work  load; normal blood pressure response; other findings as described above.                                   Gerrit Friends. Dietrich Pates, MD, [facility]   RMR/MedQ  DD:  02/26/2006  DT:  02/27/2006  Job #:  [id number]

## 2010-12-02 NOTE — H&P (Signed)
Richard [name], Richard [name]                ACCOUNT NO.:  [id number]   MEDICAL RECORD NO.:  [id number]          PATIENT TYPE:  INP   LOCATION:  A220                          FACILITY:  [facility]   PHYSICIAN:  Edward L. Juanetta Gosling, M.D.DATE OF BIRTH:  [birth year]   DATE OF ADMISSION:  11/11/2006  DATE OF DISCHARGE:  LH                              HISTORY & PHYSICAL   REASON FOR ADMISSION:  Chest discomfort.   HISTORY:  Mr. Scrima is a 45 year old who came in with complaints of  chest discomfort.  He has had episodes of this in the past.  He said  that he was talking on the phone and suddenly developed a severe  discomfort in his chest which was different than the chest pain that he  has had in the past.  It seemed to get worse when he took a deep breath  but then went away.  He was short of breath with this episode.   PAST MEDICAL HISTORY:  1. Positive for previous episodes of chest pain.  2. History of cardiac catheterization.  Apparently he had some      abnormalities on his cardiac cath.  3.  He also has hypertension.   SOCIAL HISTORY:  He does not drink any alcohol.  He does not use any  illicit drugs.  He does smoke about a half-pack or so a day.   He has also had a past medical history of reflux disease and that  appears to be worse.  He is allergic to penicillin.   MEDICATIONS:  1. Toprol 75 mg daily.  2. Nexium 40 mg daily.  3. Aspirin 325 mg daily.   PHYSICAL EXAMINATION:  Shows that he is a well-developed, well-nourished  male who is in no acute distress now.  His temperature is 97.9, pulse  60, respirations 20, blood pressure 115/60, O2 sat is 96%.  HEENT:  Pupils reactive.  Nose and throat are clear.  He has some poor  dental care.  NECK:  Supple.  CHEST:  Clear now.  HEART:  Regular without murmur, gallop or rubs.  ABDOMEN:  Soft.  No masses are felt.  Bowel sounds are present and  active.  No organs are palpable.  EXTREMITIES:  Showed no edema.  No clubbing, no cyanosis.  CNS:  Grossly intact.   White count 7,300, hemoglobin 16.2, platelets 263, glucose of 132.  Electrolytes otherwise normal.  Cardiac enzymes are negative.   ASSESSMENT:  He has chest discomfort associated with shortness of  breath.  He clearly could have pulmonary embolus.  He could have further  problems with his heart.  This could be related to reflux.  His enzymes  thus far are negative so I am going to have him take his regular  medications, get a CT of the chest and I am going to ask for a [facility]  cardiology consultation.  They have seen him in the past.      Ramon Dredge L. Juanetta Gosling, M.D.  Electronically Signed     ELH/MEDQ  D:  11/12/2006  T:  11/12/2006  Job:  [id number]

## 2010-12-02 NOTE — Consult Note (Signed)
Richard [name], Richard [name]                ACCOUNT NO.:  [id number]   MEDICAL RECORD NO.:  [id number]          PATIENT TYPE:  INP   LOCATION:  A220                          FACILITY:  [facility]   PHYSICIAN:  Pricilla Riffle, MD, FACCDATE OF BIRTH:  [birth year]   DATE OF CONSULTATION:  11/12/2006  DATE OF DISCHARGE:                                 CONSULTATION   IDENTIFICATION:  Richard [name] is a 45 year old gentleman whom we are asked  to see regarding chest pain.   HISTORY OF PRESENT ILLNESS:  The patient was admitted yesterday.  He has  had a history of chest pain.  He was on the phone yesterday.  He had  just eaten 10 minutes prior, developed chest severe chest pressure but  different from the episode the past, nonpleuritic and not in the center  of his chest.  He does note the food gets stuck with swallowing and he  has noted increased reflux.  Not too active secondary to shoulder  injury, but walks regularly.  No problems.   PAST MEDICAL HISTORY:  1. Hypertension.  2. Chest pain.  Catheterization in 2005:  LAD 30-40% mid lesion, 20%      circumflex, 30-40% OM 1, 20-30% RCA.  LVEF 55%.  3.  4. Hiatal hernia with GE reflux followed by Dr. Karilyn Cota in the past.  5. Cholesterol borderline, cannot afford a statin, which he had been      on.  6. Back pain.  7. Increased lipids.  8. Scoliosis.   Note:  Stress test August 2007.  Exercised for 12 METs to a peak heart  rate of 133, 74% predicted maximal.  No ischemia for this heart rate  achieved.   MEDICATIONS ON ADMISSION:  1. Toprol XL 75 mg.  2. Aspirin 325 mg.  3. Nexium.  4. Cannot afford statin.   ALLERGIES:  PENICILLIN.   SOCIAL HISTORY:  The patient does not drink.  Smokes by the pack and  lets it burn mainly.   FAMILY HISTORY:  Significant for premature CAD.  Dad had an MI at 28,  brother MI at 52.  Positive diabetes.   REVIEW OF SYSTEMS:  All systems are reviewed, negative to the above  problem except as noted above.   PHYSICAL EXAMINATION:  GENERAL:  The patient currently in no distress.  VITAL SIGNS:  Temperature is 99.7, blood pressure 115-145 over 60-68,  pulse 61.  HEENT:  Normocephalic, atraumatic.  EOMI.  PERRL.  Throat clear.  Nares  clear.  NECK:  JVP is normal.  No thyromegaly.  No bruits.  LUNGS:  Clear to auscultation.  No rales or wheezes.  CARDIAC:  regular rate and rhythm, S1, S2.  No S3, S4 or murmurs.  ABDOMEN:  Supple, nontender.  No hepatomegaly.  Normal bowel sounds.  EXTREMITIES:  Good distal pulses, no edema.   A 12-lead EKG shows normal sinus bradycardia at 54 beats per minute.  Labs significant for a hemoglobin of 16.2, white count of 7.3.  BUN and  creatinine of 16 and 1, potassium of 3.7.  Initial CK-MB 77/0.8; 62/0.7.  Troponin 0.01, 0.02.   IMPRESSION:  The patient is a 45 year old male, history of mild coronary  artery disease by catheterization.  Chest pressure yesterday with some  dizziness , ate 10 minutes earlier.  Having significant problems with  reflux and coughing up food.  No symptoms with exercise.  Walks.  No  change in ability to do this.   Overall does not appear to be a cardiac etiology.  It sounds more GI.  Note, the patient has been seen by Dr. Karilyn Cota remote, question  stricture.  Would continue to monitor, if he rules in catheterization  but if negative, GI evaluation for now.   Dyslipidemia, cannot afford prescription .  Check lipids in a.m.  May do  well on Zocor.  Treat aggressively given family with family history.   Tobacco:  Was consulted on cessation.   Will continue to follow.      Pricilla Riffle, MD, [facility]  Electronically Signed     PVR/MEDQ  D:  11/12/2006  T:  11/12/2006  Job:  [id number]

## 2010-12-09 ENCOUNTER — Telehealth: Payer: Self-pay

## 2010-12-09 MED ORDER — ESOMEPRAZOLE MAGNESIUM 40 MG PO CPDR: DELAYED_RELEASE_CAPSULE | ORAL | Status: DC

## 2010-12-09 NOTE — Telephone Encounter (Signed)
Pt's wife called and said last time Rx for Nexium was called in it was for once a day, and he had previously been on twice daily. He would like to get that changed/up dated. He uses [facility] in [place].

## 2010-12-09 NOTE — Telephone Encounter (Addendum)
RX sent to [facility] in [place] as per discussion with Doris.

## 2010-12-19 ENCOUNTER — Ambulatory Visit: Payer: Medicaid Other | Admitting: Cardiology

## 2010-12-20 DIAGNOSIS — I10 Essential (primary) hypertension: Secondary | ICD-10-CM | POA: Insufficient documentation

## 2010-12-20 DIAGNOSIS — F329 Major depressive disorder, single episode, unspecified: Secondary | ICD-10-CM

## 2010-12-20 DIAGNOSIS — F419 Anxiety disorder, unspecified: Secondary | ICD-10-CM | POA: Insufficient documentation

## 2010-12-20 DIAGNOSIS — K529 Noninfective gastroenteritis and colitis, unspecified: Secondary | ICD-10-CM

## 2010-12-21 ENCOUNTER — Ambulatory Visit: Payer: Medicaid Other | Admitting: Cardiology

## 2010-12-27 ENCOUNTER — Ambulatory Visit (INDEPENDENT_AMBULATORY_CARE_PROVIDER_SITE_OTHER): Payer: Medicaid Other | Admitting: Adult Health

## 2010-12-27 ENCOUNTER — Encounter: Payer: Self-pay | Admitting: Adult Health

## 2010-12-27 DIAGNOSIS — E785 Hyperlipidemia, unspecified: Secondary | ICD-10-CM

## 2010-12-27 DIAGNOSIS — H6093 Unspecified otitis externa, bilateral: Secondary | ICD-10-CM | POA: Insufficient documentation

## 2010-12-27 DIAGNOSIS — I10 Essential (primary) hypertension: Secondary | ICD-10-CM

## 2010-12-27 DIAGNOSIS — H60399 Other infective otitis externa, unspecified ear: Secondary | ICD-10-CM

## 2010-12-27 MED ORDER — NEOMYCIN-POLYMYXIN-HC 3.5-10000-1 OT SUSP: 5.0000 [drp] | Freq: Three times a day (TID) | OTIC | Status: DC

## 2010-12-27 NOTE — Patient Instructions (Signed)
Your physician recommends that you schedule a follow-up appointment in:1 YR Your physician has recommended you make the following change in your medication: CORTISPORIN OTIC 5 DROPS EACH EAR 3 TIMES A DAY FOR 7 DAYS

## 2010-12-27 NOTE — Assessment & Plan Note (Signed)
Currently very well controlled at present  No changes in medication regimen at this time.

## 2010-12-27 NOTE — Assessment & Plan Note (Signed)
He will need follow-up lipids and LFT's.  Will will have him go to be drawn this month and labs will be reviewed.  Last documentation I have is from Dec of 2011. Cholesterol at that time 175;TG 137; HDL 37; LDL 111.

## 2010-12-27 NOTE — Assessment & Plan Note (Signed)
Will begin cortosporin ear drops TID for one week. If not better he will follow with his primary care physician.

## 2010-12-27 NOTE — Progress Notes (Signed)
HPI: Richard [name] is a 45 y/o CM patient of Dr. Dietrich Pates we are following for continued assessment and treatment of CAD, nonobstructive per cath in 2005, hyperlipidemia, hypertension, and ongoing tobacco abuse.  He has been without complaint with the exception of some ear drainage and itching inside both ears.  He denies chest pain. He unfortunately continues to smoke and have some DOE at times.  Allergies  Allergen Reactions  . Penicillins     REACTION: Unknown reaction    Current Outpatient Prescriptions  Medication Sig Dispense Refill  . acetaminophen (TYLENOL) 325 MG tablet Take 650 mg by mouth every 6 (six) hours as needed.        . ALPRAZolam (XANAX) 0.5 MG tablet Take 0.5 mg by mouth at bedtime as needed.        [name] aspirin 81 MG tablet Take 81 mg by mouth daily.        [name] esomeprazole (NEXIUM) 40 MG capsule 30 mins before BF and 30 mins before evening meal  60 capsule  5  . hydrochlorothiazide 25 MG tablet Take 12.5 mg by mouth daily.        [name] HYDROcodone-acetaminophen (VICODIN ES) 7.5-750 MG per tablet Take 1 tablet by mouth every 6 (six) hours as needed.        [name] lisinopril (PRINIVIL,ZESTRIL) 20 MG tablet Take 20 mg by mouth daily.        . metoprolol (TOPROL-XL) 50 MG 24 hr tablet Take 1.5 tablets (75 mg total) by mouth daily.  45 tablet  3  . pravastatin (PRAVACHOL) 40 MG tablet Take 1 tablet (40 mg total) by mouth daily.  30 tablet  3  . DISCONTD: aspirin 325 MG tablet Take 325 mg by mouth daily.       [name] neomycin-polymyxin-hydrocortisone (CORTISPORIN) 3.5-10000-1 otic suspension Place 5 drops into both ears 3 (three) times daily. X7 DAYS  10 mL  0  . DISCONTD: calcium carbonate (TUMS - DOSED IN MG ELEMENTAL CALCIUM) 500 MG chewable tablet Chew 1 tablet by mouth daily.        [name] DISCONTD: methocarbamol (ROBAXIN) 500 MG tablet Take 500 mg by mouth as needed.          Past Medical History  Diagnosis Date  . Hiatal hernia   . COPD (chronic obstructive pulmonary disease)   . GERD  (gastroesophageal reflux disease)   . Hyperlipemia   . Hypertension   . Obesity   . Depression   . Colitis 1990  . Tobacco abuse   . Depression   . Diabetes mellitus   . Colitis     Past Surgical History  Procedure Date  . Colonoscopy 1990  . Shoulder surgery     Right   . Coronary angioplasty 2005    YNW:GNFAOZ of systems complete and found to be negative unless listed above PHYSICAL EXAM BP 120/75  Pulse 66  Ht 6' (1.829 m)  Wt 218 lb (98.884 kg)  BMI 29.57 kg/m2  SpO2 96% General: Well developed, well nourished, in no acute distress Head: Eyes PERRLA, No xanthomas.   Normal cephalic and atramatic Ears have bleeding inside the external canal with inflammation bilaterally.              More noted on the left. Lungs: Clear bilaterally to auscultation and percussion. Heart: HRRR S1 S2, with soft S4 murmur.  Pulses are 2+ & equal.            No carotid bruit. No JVD.  No  abdominal bruits. No femoral bruits. Abdomen: Bowel sounds are positive, abdomen soft and non-tender without masses or                  Hernia's noted. Msk:  Back normal, normal gait. Normal strength and tone for age. Extremities: No clubbing, cyanosis or edema.  DP +1 Neuro: Alert and oriented X 3. Psych:  Good affect, responds appropriately   ASSESSMENT AND PLAN

## 2011-03-02 ENCOUNTER — Telehealth: Payer: Self-pay | Admitting: Adult Health

## 2011-03-02 NOTE — Telephone Encounter (Signed)
States that when patient was seen last, Samara Deist gave patient some ear drops.  States that she told patient if it did not get any better that she would refer him to ENT.  States that it hasn't gotten better and wants to know if she can get that referral. / tg

## 2011-03-17 ENCOUNTER — Other Ambulatory Visit: Payer: Self-pay | Admitting: Cardiology

## 2011-03-21 ENCOUNTER — Encounter: Payer: Self-pay | Admitting: Cardiology

## 2011-03-22 ENCOUNTER — Other Ambulatory Visit: Payer: Self-pay | Admitting: Cardiology

## 2011-03-22 ENCOUNTER — Ambulatory Visit (INDEPENDENT_AMBULATORY_CARE_PROVIDER_SITE_OTHER): Payer: Medicaid Other | Admitting: Cardiology

## 2011-03-22 ENCOUNTER — Encounter: Payer: Self-pay | Admitting: Cardiology

## 2011-03-22 VITALS — BP 124/78 | HR 67 | Resp 18 | Ht 71.0 in | Wt 218.0 lb

## 2011-03-22 DIAGNOSIS — E119 Type 2 diabetes mellitus without complications: Secondary | ICD-10-CM

## 2011-03-22 DIAGNOSIS — K7689 Other specified diseases of liver: Secondary | ICD-10-CM

## 2011-03-22 DIAGNOSIS — F17200 Nicotine dependence, unspecified, uncomplicated: Secondary | ICD-10-CM

## 2011-03-22 DIAGNOSIS — E785 Hyperlipidemia, unspecified: Secondary | ICD-10-CM

## 2011-03-22 DIAGNOSIS — E782 Mixed hyperlipidemia: Secondary | ICD-10-CM

## 2011-03-22 DIAGNOSIS — I10 Essential (primary) hypertension: Secondary | ICD-10-CM

## 2011-03-22 DIAGNOSIS — I2510 Atherosclerotic heart disease of native coronary artery without angina pectoris: Secondary | ICD-10-CM

## 2011-03-22 DIAGNOSIS — J449 Chronic obstructive pulmonary disease, unspecified: Secondary | ICD-10-CM

## 2011-03-22 MED ORDER — ATORVASTATIN CALCIUM 20 MG PO TABS: 20.0000 mg | ORAL_TABLET | Freq: Every day | ORAL | Status: DC

## 2011-03-22 NOTE — Assessment & Plan Note (Addendum)
Blood pressure control is good; current medications will be continued.  [name] will return for reassessment in 7 months.

## 2011-03-22 NOTE — Assessment & Plan Note (Signed)
Hepatic disorder is managed by Dr. Lionel December.  The importance of weight loss and restriction of concentrated sweets from his diet was emphasized.

## 2011-03-22 NOTE — Progress Notes (Signed)
HPI : Richard [name] returns to the office as scheduled for continued assessment and treatment of nonobstructive coronary disease, hyperlipidemia, and chronic chest discomfort.  Since his last visit 8 months ago, he has done fairly well.  He recently experienced a spell of near syncope after working on a roof for approximately 2 hours.  He consumed perhaps a quart of fluid over that interval, but still developed extreme lightheadedness after he descended to the ground; symptoms passed after a few minutes of rest.  He continues to have chest discomfort, which he attributes to a chest wall injury related to an episode of emesis some years ago.  He is not inclined to use nonsteroidal medication for this discomfort, as he believes that this may have an adverse effect on his hepatic steatosis.  He remains active despite his symptoms.  Current Outpatient Prescriptions on File Prior to Visit  Medication Sig Dispense Refill  . acetaminophen (TYLENOL) 325 MG tablet Take 650 mg by mouth every 6 (six) hours as needed.        . ALPRAZolam (XANAX) 0.5 MG tablet Take 0.5 mg by mouth at bedtime as needed.        [name] aspirin 81 MG tablet Take 81 mg by mouth daily.        [name] esomeprazole (NEXIUM) 40 MG capsule 30 mins before BF and 30 mins before evening meal  60 capsule  5  . hydrochlorothiazide 25 MG tablet Take 12.5 mg by mouth daily.        [name] HYDROcodone-acetaminophen (VICODIN ES) 7.5-750 MG per tablet Take 1 tablet by mouth every 6 (six) hours as needed.        [name] lisinopril (PRINIVIL,ZESTRIL) 20 MG tablet Take 20 mg by mouth daily.        . metoprolol (TOPROL-XL) 50 MG 24 hr tablet TAKE ONE & ONE-HALF TABLETS BY MOUTH EVERY DAY  45 tablet  2  . neomycin-polymyxin-hydrocortisone (CORTISPORIN) 3.5-10000-1 otic suspension Place 5 drops into both ears 3 (three) times daily. X7 DAYS  10 mL  0  . atorvastatin (LIPITOR) 20 MG tablet Take 1 tablet (20 mg total) by mouth at bedtime. To replace Pravastatin  30 tablet  7      Allergies  Allergen Reactions  . Mushroom Ext Cmplx(Shiitake-Reishi-Mait) Anaphylaxis    Rapid heart rate.  . Penicillins     REACTION: Unknown reaction      Past medical history, social history, and family history reviewed and updated.  ROS: Patient denies orthopnea, PND, dyspnea on exertion or significant palpitations.  He continues to smoke cigarettes and to consume a huge quantity of [facility].  He gained approximately 100 pounds over the past few years, but has lost back 40.  PHYSICAL EXAM: BP 124/78  Pulse 67  Resp 18  Ht 5\' 11"  (1.803 m)  Wt 98.884 kg (218 lb)  BMI 30.40 kg/m2  SpO2 96%  General-Well developed; no acute distress Body habitus-overweight Neck-No JVD; no carotid bruits Lungs-clear lung fields; resonant to percussion Cardiovascular-normal PMI; split S1 and normal S2; modest basilar systolic ejection murmur Abdomen-normal bowel sounds; soft and non-tender without masses or organomegaly Musculoskeletal-No deformities, no cyanosis or clubbing Neurologic-Normal cranial nerves; symmetric strength and tone Skin-Warm, no significant lesions Extremities-distal pulses intact; no edema  EKG:  Normal sinus rhythm; borderline left atrial abnormality; nondiagnostic inferior Q waves; otherwise normal.  Comparison with previous tracing performed 01/26/08, nonspecific ST-T wave abnormality has resolved.  ASSESSMENT AND PLAN:

## 2011-03-22 NOTE — Patient Instructions (Signed)
Your physician encouraged you to lose weight for better health.  Your physician discussed the hazards of tobacco use. Tobacco use cessation is recommended and techniques and options to help you quit were discussed.  Your physician recommends that you stop drinking Mt. Dew  Your physician has recommended you make the following change in your medication: stop taking Pravastatin and start taking Atorvastatin 20 mg at bedtime  Your physician recommends that you return for lab work in: today and in 1 month  Your physician recommends that you schedule a follow-up appointment in: 7 months

## 2011-03-22 NOTE — Assessment & Plan Note (Addendum)
Patient has not officially been diagnosed with diabetes, but his serum glucose levels have been elevated when assessed with his wife's meter, sometimes exceeding 300.  A hemoglobin A1c level will be obtained and patient referred to a PCP.

## 2011-03-22 NOTE — Assessment & Plan Note (Signed)
Lipids were suboptimal when last assessed on current therapy.pravastatin will be changed to atorvastatin 20 mg per day with a repeat lipid profile in 2 months.

## 2011-03-23 ENCOUNTER — Telehealth: Payer: Self-pay | Admitting: Cardiology

## 2011-03-23 LAB — HEMOGLOBIN A1C: Hgb A1c MFr Bld: 8.3 % — ABNORMAL HIGH (ref <5.7)

## 2011-03-23 LAB — CBC WITH DIFFERENTIAL/PLATELET
Eosinophils Absolute: 0.1 10*3/uL (ref 0.0–0.7)
HCT: 44.4 % (ref 39.0–52.0)
Hemoglobin: 15.5 g/dL (ref 13.0–17.0)
Lymphs Abs: 2.6 10*3/uL (ref 0.7–4.0)
MCH: 31.4 pg (ref 26.0–34.0)
MCV: 90.1 fL (ref 78.0–100.0)
Monocytes Absolute: 0.5 10*3/uL (ref 0.1–1.0)
Monocytes Relative: 7 % (ref 3–12)
Neutrophils Relative %: 53 % (ref 43–77)
RBC: 4.93 MIL/uL (ref 4.22–5.81)

## 2011-03-23 LAB — LIPID PANEL
Cholesterol: 156 mg/dL (ref 0–200)
Triglycerides: 218 mg/dL — ABNORMAL HIGH (ref <150)

## 2011-03-23 LAB — COMPREHENSIVE METABOLIC PANEL
Albumin: 4.5 g/dL (ref 3.5–5.2)
CO2: 23 mEq/L (ref 19–32)
Glucose, Bld: 171 mg/dL — ABNORMAL HIGH (ref 70–99)
Sodium: 141 mEq/L (ref 135–145)
Total Bilirubin: 0.3 mg/dL (ref 0.3–1.2)
Total Protein: 6.9 g/dL (ref 6.0–8.3)

## 2011-03-23 NOTE — Telephone Encounter (Signed)
CALLING FOR LAb results done 03/22/11/tmj

## 2011-03-27 ENCOUNTER — Encounter: Payer: Self-pay | Admitting: Cardiology

## 2011-04-05 ENCOUNTER — Telehealth: Payer: Self-pay | Admitting: Gastroenterology

## 2011-04-05 ENCOUNTER — Ambulatory Visit: Payer: Medicaid Other | Admitting: Gastroenterology

## 2011-04-06 NOTE — Telephone Encounter (Signed)
Routed to provider

## 2011-04-07 ENCOUNTER — Emergency Department (HOSPITAL_COMMUNITY): Payer: Medicaid Other

## 2011-04-07 ENCOUNTER — Emergency Department (HOSPITAL_COMMUNITY)
Admission: EM | Admit: 2011-04-07 | Discharge: 2011-04-07 | Disposition: A | Discharge status: Home / Self Care | Payer: Medicaid Other | Attending: Emergency Medicine | Admitting: Emergency Medicine

## 2011-04-07 ENCOUNTER — Encounter (HOSPITAL_COMMUNITY): Payer: Self-pay | Admitting: Emergency Medicine

## 2011-04-07 ENCOUNTER — Other Ambulatory Visit: Payer: Self-pay

## 2011-04-07 DIAGNOSIS — K219 Gastro-esophageal reflux disease without esophagitis: Secondary | ICD-10-CM | POA: Insufficient documentation

## 2011-04-07 DIAGNOSIS — F17200 Nicotine dependence, unspecified, uncomplicated: Secondary | ICD-10-CM | POA: Insufficient documentation

## 2011-04-07 DIAGNOSIS — Z7982 Long term (current) use of aspirin: Secondary | ICD-10-CM | POA: Insufficient documentation

## 2011-04-07 DIAGNOSIS — I10 Essential (primary) hypertension: Secondary | ICD-10-CM | POA: Insufficient documentation

## 2011-04-07 DIAGNOSIS — R079 Chest pain, unspecified: Secondary | ICD-10-CM | POA: Insufficient documentation

## 2011-04-07 DIAGNOSIS — E119 Type 2 diabetes mellitus without complications: Secondary | ICD-10-CM | POA: Insufficient documentation

## 2011-04-07 DIAGNOSIS — R002 Palpitations: Secondary | ICD-10-CM

## 2011-04-07 DIAGNOSIS — J449 Chronic obstructive pulmonary disease, unspecified: Secondary | ICD-10-CM | POA: Insufficient documentation

## 2011-04-07 DIAGNOSIS — J4489 Other specified chronic obstructive pulmonary disease: Secondary | ICD-10-CM | POA: Insufficient documentation

## 2011-04-07 DIAGNOSIS — E785 Hyperlipidemia, unspecified: Secondary | ICD-10-CM | POA: Insufficient documentation

## 2011-04-07 LAB — BASIC METABOLIC PANEL
Chloride: 98 mEq/L (ref 96–112)
GFR calc Af Amer: >60 mL/min (ref >60)
GFR calc non Af Amer: >60 mL/min (ref >60)
Potassium: 3.2 mEq/L — ABNORMAL LOW (ref 3.5–5.1)
Sodium: 137 mEq/L (ref 135–145)

## 2011-04-07 LAB — POCT I-STAT TROPONIN I: Troponin i, poc: 0.01 ng/mL (ref 0.00–0.08)

## 2011-04-07 LAB — DIFFERENTIAL
Basophils Relative: 0 % (ref 0–1)
Eosinophils Absolute: 0.2 10*3/uL (ref 0.0–0.7)
Neutro Abs: 3.5 10*3/uL (ref 1.7–7.7)
Neutrophils Relative %: 44 % (ref 43–77)

## 2011-04-07 LAB — CBC
Hemoglobin: 16.1 g/dL (ref 13.0–17.0)
MCH: 31.6 pg (ref 26.0–34.0)
MCHC: 35.1 g/dL (ref 30.0–36.0)
Platelets: 217 10*3/uL (ref 150–400)
RBC: 5.1 MIL/uL (ref 4.22–5.81)

## 2011-04-07 MED ORDER — NITROGLYCERIN 0.4 MG SL SUBL
SUBLINGUAL_TABLET | SUBLINGUAL | Status: AC
  Administered 2011-04-07: 0.4 mg via ORAL
  Filled 2011-04-07: qty 25

## 2011-04-07 MED ORDER — ACETAMINOPHEN 500 MG PO TABS
ORAL_TABLET | ORAL | Status: AC
  Administered 2011-04-07: 1000 mg
  Filled 2011-04-07: qty 2

## 2011-04-07 NOTE — ED Provider Notes (Addendum)
History     CSN: [id number] Arrival date & time: 04/07/2011  4:32 AM  Chief Complaint  Patient presents with  . Palpitations    HPI  (Consider location/radiation/quality/duration/timing/severity/associated sxs/prior treatment)  HPI Comments: Seen 29  Patient is a 45 y.o. male presenting with chest pain. The history is provided by the patient and the spouse.  Chest Pain The chest pain began 1 - 2 hours ago (Patient states he has had episodes of "fluttering" for several weeks. They are often associated with some chest pressure. he had a similar episode tonight making it difficult for him to go to sleep.). Chest pain occurs frequently. The chest pain is unchanged. Associated with: nothing. At its most intense, the pain is at 5/10. The pain is currently at 1/10. The severity of the pain is moderate. The quality of the pain is described as pressure-like. The pain does not radiate. Exacerbated by: nothing. Primary symptoms include palpitations. Associated symptoms comments: Chest pressure. He tried nothing for the symptoms. Risk factors include lack of exercise, male gender and smoking/tobacco exposure.  His past medical history is significant for diabetes and hyperlipidemia.  Procedure history is positive for exercise treadmill test.     Past Medical History  Diagnosis Date  . Chest pain     + palpitations; cath 2005- 30-40% mid LAD, 20% D1, 20% cx, OM, 20-30% RCA, and EF-55%  . COPD (chronic obstructive pulmonary disease)   . GERD (gastroesophageal reflux disease)   . Hyperlipemia   . Hypertension   . Obesity   . Depression   . Colitis 1990  . Tobacco abuse     1/2 pack per day  . Diabetes mellitus     Past Surgical History  Procedure Date  . Colonoscopy 1990  . Shoulder surgery     Right acromioclavicular joint arthrosis  . Neck mass excision   . Cardiac catheterization     Family History  Problem Relation Age of Onset  . Lung cancer Mother   . Heart attack Father 1   . Diabetes Father   . Hypertension Brother   . Colon cancer Neg Hx   . Hypertension Brother   . Heart attack Brother 21    History  Substance Use Topics  . Smoking status: Current Everyday Smoker -- 0.5 packs/day for 80 years  . Smokeless tobacco: Never Used  . Alcohol Use: No      Review of Systems  Review of Systems  Cardiovascular: Positive for chest pain and palpitations.  All other systems reviewed and are negative.    Allergies  Mushroom ext cmplx(shiitake-reishi-mait) and Penicillins  Home Medications   Current Outpatient Rx  Name Route Sig Dispense Refill  . ACETAMINOPHEN 325 MG PO TABS Oral Take 650 mg by mouth every 6 (six) hours as needed.      . ALPRAZOLAM 0.5 MG PO TABS Oral Take 0.5 mg by mouth at bedtime as needed.      . ASPIRIN 81 MG PO TABS Oral Take 81 mg by mouth daily.      . ATORVASTATIN CALCIUM 20 MG PO TABS Oral Take 1 tablet (20 mg total) by mouth at bedtime. To replace Pravastatin 30 tablet 7  . ESOMEPRAZOLE MAGNESIUM 40 MG PO CPDR  30 mins before BF and 30 mins before evening meal 60 capsule 5  . HYDROCODONE-ACETAMINOPHEN 7.5-750 MG PO TABS Oral Take 1 tablet by mouth every 6 (six) hours as needed.      [name] LISINOPRIL 20 MG PO TABS  Oral Take 20 mg by mouth daily.      [name] METOPROLOL SUCCINATE 50 MG PO TB24  TAKE ONE & ONE-HALF TABLETS BY MOUTH EVERY DAY 45 tablet 2  . HYDROCHLOROTHIAZIDE 25 MG PO TABS Oral Take 12.5 mg by mouth daily.      . NEOMYCIN-POLYMYXIN-HC 3.5-10000-1 OT SUSP Both Ears Place 5 drops into both ears 3 (three) times daily. X7 DAYS 10 mL 0    X7 DAYS    Physical Exam    BP 152/87  Pulse 70  Temp(Src) 97.8 F (36.6 C) (Oral)  Resp 18  Ht 5\' 11"  (1.803 m)  Wt 213 lb 9.6 oz (96.888 kg)  BMI 29.79 kg/m2  SpO2 99%  Physical Exam  Nursing note and vitals reviewed. Constitutional: He is oriented to person, place, and time. He appears well-developed and well-nourished.  HENT:  Head: Normocephalic and atraumatic.  Eyes:  EOM are normal. Pupils are equal, round, and reactive to light.  Neck: Normal range of motion. Neck supple.  Cardiovascular: Normal rate, normal heart sounds and intact distal pulses.   Pulmonary/Chest: Effort normal and breath sounds normal.  Abdominal: Soft. Bowel sounds are normal.  Musculoskeletal: Normal range of motion.  Neurological: He is alert and oriented to person, place, and time.  Skin: Skin is warm and dry.    ED Course  CRITICAL CARE Performed by: Annamarie Dawley Authorized by: Annamarie Dawley Total critical care time: 45 minutes Critical care was time spent personally by me on the following activities: development of treatment plan with patient or surrogate, evaluation of patient's response to treatment, examination of patient, obtaining history from patient or surrogate, ordering and performing treatments and interventions, ordering and review of laboratory studies, pulse oximetry, re-evaluation of patient's condition and review of old charts.   (including critical care time)   Labs Reviewed  POCT I-STAT TROPONIN I  CBC  DIFFERENTIAL  BASIC METABOLIC PANEL  I-STAT TROPONIN I   No results found.  Date: 04/07/2011 0435  Rate: 66 Rhythm: normal sinus rhythm  QRS Axis: normal  Intervals: normal  ST/T Wave abnormalities: normal  Conduction Disutrbances:none  Narrative Interpretation:   Old EKG Reviewed: unchanged  Patient with risk factors of ACS presented with palpitations associated with chest pressure. Denies shortness of breath, nausea, vomiting. Troponin negative. At presentation patient had a 2/10 pressure remaining in his chest. Relieved with one SL ntg. Patient has remained in NSR through entire stay in the ER.EKG unremarkable. Last stress test was in first of the year. Patient is followed by Dr. Juanetta Gosling, PCP and Dr. Dietrich Pates, cardiology. Patient will follow up with Dr. Dietrich Pates.Pt stable in ED with no significant deterioration in condition.Pt feels improved  after observation and/or treatment in ED.Patient  informed of clinical course, understand medical decision-making process, and agree with plan..MDM Reviewed: nursing note and vitals Reviewed previous: labs, ECG and x-ray Interpretation: labs and ECG     [facility]. Colon Branch, MD 04/07/11 0454  Nicoletta Dress. Colon Branch, MD 04/07/11 0981

## 2011-04-07 NOTE — ED Notes (Signed)
Patient states he woke up at 0245 with a fluttering feeling in his chest.  Also c/o left arm/hand numbness.  Denies shortness of breath; states has some nausea.

## 2011-04-11 NOTE — Progress Notes (Signed)
Pt had results on 9/20

## 2011-04-13 ENCOUNTER — Other Ambulatory Visit: Payer: Self-pay | Admitting: Cardiology

## 2011-04-13 LAB — BASIC METABOLIC PANEL
BUN: 8
CO2: 24
Calcium: 8.8
Creatinine, Ser: 1.07
GFR calc Af Amer: >60
GFR calc non Af Amer: >60
Glucose, Bld: 131 — ABNORMAL HIGH
Potassium: 4.2
Sodium: 138
Sodium: 138

## 2011-04-13 LAB — COMPREHENSIVE METABOLIC PANEL
CO2: 26
Calcium: 8.9
Creatinine, Ser: 0.95
GFR calc non Af Amer: >60
Glucose, Bld: 137 — ABNORMAL HIGH

## 2011-04-13 LAB — CBC
HCT: 44.9
Hemoglobin: 15.5
MCHC: 33.9
MCV: 91.5
Platelets: 182
RBC: 4.99
RDW: 12.9
WBC: 7.9

## 2011-04-13 LAB — POCT CARDIAC MARKERS
CKMB, poc: 1.6
Myoglobin, poc: 49.9
Operator id: 198161
Operator id: 282261

## 2011-04-13 LAB — PHOSPHORUS: Phosphorus: 4.1

## 2011-04-13 LAB — CK TOTAL AND CKMB (NOT AT ARMC)
CK, MB: 1.1
Relative Index: INVALID
Relative Index: INVALID
Total CK: 85
Total CK: 92

## 2011-04-13 LAB — DIFFERENTIAL
Basophils Relative: 1
Lymphocytes Relative: 40
Lymphs Abs: 3.1
Monocytes Relative: 9
Neutro Abs: 3.9
Neutrophils Relative %: 49

## 2011-04-13 LAB — HEMOGLOBIN A1C
Hgb A1c MFr Bld: 6
Mean Plasma Glucose: 136

## 2011-04-13 LAB — TROPONIN I
Troponin I: <0.01
Troponin I: <0.01

## 2011-04-13 LAB — LIPID PANEL
Cholesterol: 139
LDL Cholesterol: 74
Triglycerides: 171 — ABNORMAL HIGH

## 2011-04-13 LAB — PROTIME-INR: Prothrombin Time: 12.8

## 2011-04-13 LAB — APTT: aPTT: 38 — ABNORMAL HIGH

## 2011-04-18 ENCOUNTER — Encounter: Payer: Self-pay | Admitting: Cardiology

## 2011-04-21 ENCOUNTER — Encounter: Payer: Self-pay | Admitting: Adult Health

## 2011-04-21 ENCOUNTER — Ambulatory Visit: Payer: Medicaid Other | Admitting: Cardiology

## 2011-04-21 ENCOUNTER — Ambulatory Visit (INDEPENDENT_AMBULATORY_CARE_PROVIDER_SITE_OTHER): Payer: Medicaid Other | Admitting: Adult Health

## 2011-04-21 DIAGNOSIS — R0989 Other specified symptoms and signs involving the circulatory and respiratory systems: Secondary | ICD-10-CM

## 2011-04-21 DIAGNOSIS — R0789 Other chest pain: Secondary | ICD-10-CM

## 2011-04-21 DIAGNOSIS — F17200 Nicotine dependence, unspecified, uncomplicated: Secondary | ICD-10-CM

## 2011-04-21 DIAGNOSIS — J449 Chronic obstructive pulmonary disease, unspecified: Secondary | ICD-10-CM

## 2011-04-21 DIAGNOSIS — J4489 Other specified chronic obstructive pulmonary disease: Secondary | ICD-10-CM

## 2011-04-21 DIAGNOSIS — R0609 Other forms of dyspnea: Secondary | ICD-10-CM

## 2011-04-21 DIAGNOSIS — R002 Palpitations: Secondary | ICD-10-CM

## 2011-04-21 MED ORDER — CYCLOBENZAPRINE HCL 10 MG PO TABS: 10.0000 mg | ORAL_TABLET | Freq: Three times a day (TID) | ORAL | Status: DC | PRN

## 2011-04-21 NOTE — Assessment & Plan Note (Signed)
PFT's will be completed to evaluate further. He will follow with Dr. Juanetta Gosling concerning this.  I have advised smoking cessation immediately. He verbalizes understanding, stating that smoking is making him nauseated.

## 2011-04-21 NOTE — Assessment & Plan Note (Signed)
Will place cardionet monitor on him to evaluate frequency and duration of his somatic palpitations.  He will also have TSH drawn. Follow-up after completion of testing.

## 2011-04-21 NOTE — Assessment & Plan Note (Signed)
Cessation is discussed.

## 2011-04-21 NOTE — Progress Notes (Signed)
HPI: Mr. Davis is a 45 y/o patient of Dr. Dietrich Pates we are seeing for ongoing assessment and treatment of nonobstructive CAD, hyperlipidemia, chronic chest discomfort. He comes today with complaints of frequent palpitations, and muscle spasms.  He was seen in the ER for  On 04/07/2011 for this with associated chest discomfort.  He was not found to have arrhythmias and was sent home after having labs and X-rays.  He was told he has COPD and still continues to smoke 1/2 ppd of cigarettes.  He states that he feels palpitations everyday, most at nighttime.  He is experiencing some occasional shortness of breath with this. He also complains of muscle aches in his shoulders from lifting heavy equipment at work.   Allergies  Allergen Reactions  . Mushroom Ext Cmplx(Shiitake-Reishi-Mait) Anaphylaxis    Rapid heart rate.  . Penicillins     REACTION: Unknown reaction    Current Outpatient Prescriptions  Medication Sig Dispense Refill  . acetaminophen (TYLENOL) 325 MG tablet Take 650 mg by mouth every 6 (six) hours as needed.        [name] aspirin 81 MG tablet Take 81 mg by mouth daily.        [name] atorvastatin (LIPITOR) 20 MG tablet Take 1 tablet (20 mg total) by mouth at bedtime. To replace Pravastatin  30 tablet  7  . esomeprazole (NEXIUM) 40 MG capsule 30 mins before BF and 30 mins before evening meal  60 capsule  5  . hydrochlorothiazide 25 MG tablet Take 12.5 mg by mouth daily.        [name] lisinopril (PRINIVIL,ZESTRIL) 20 MG tablet TAKE ONE TABLET BY MOUTH EVERY DAY **DISCONTINUE LISINOPRIL 5 MG AND HYDROCHLOROTHIAZIDE PER DOCTOR**  30 tablet  6  . metFORMIN (GLUCOPHAGE) 500 MG tablet Take 500 mg by mouth 2 (two) times daily with a meal.        . metoprolol (TOPROL-XL) 50 MG 24 hr tablet TAKE ONE & ONE-HALF TABLETS BY MOUTH EVERY DAY  45 tablet  2  . neomycin-polymyxin-hydrocortisone (CORTISPORIN) 3.5-10000-1 otic suspension Place 5 drops into both ears 3 (three) times daily. X7 DAYS  10 mL  0  . ALPRAZolam  (XANAX) 0.5 MG tablet Take 0.5 mg by mouth at bedtime as needed.        . cyclobenzaprine (FLEXERIL) 10 MG tablet Take 1 tablet (10 mg total) by mouth 3 (three) times daily as needed for muscle spasms. For Muscle spasms  90 tablet  0  . HYDROcodone-acetaminophen (VICODIN ES) 7.5-750 MG per tablet Take 1 tablet by mouth every 6 (six) hours as needed.          Past Medical History  Diagnosis Date  . Chest pain     + palpitations; cath 2005- 30-40% mid LAD, 20% D1, 20% cx, OM, 20-30% RCA, and EF-55%  . COPD (chronic obstructive pulmonary disease)   . GERD (gastroesophageal reflux disease)   . Hyperlipemia   . Hypertension   . Obesity   . Depression   . Colitis 1990  . Tobacco abuse     1/2 pack per day  . Diabetes mellitus     Past Surgical History  Procedure Date  . Colonoscopy 1990  . Shoulder surgery     Right acromioclavicular joint arthrosis  . Neck mass excision   . Cardiac catheterization     UJW:JXBJYN of systems complete and found to be negative unless listed above PHYSICAL EXAM BP 109/72  Pulse 66  Ht 5\' 11"  (1.803 m)  Wt 217 lb (98.431 kg)  BMI 30.27 kg/m2 General: Well developed, well nourished, in no acute distress Head: Eyes PERRLA, No xanthomas.   Normal cephalic and atramatic  Lungs: Inspiratory and expiratory wheezes noted throughout lung fields. Occasional cough with inspiration. Heart: HRRR S1 S2, without MRG.  Pulses are 2+ & equal.            No carotid bruit. No JVD.  No abdominal bruits. No femoral bruits. Abdomen: Bowel sounds are positive, abdomen soft and non-tender without masses or                  Hernia's noted. Msk:  Back normal, normal gait. Normal strength and tone for age. Extremities: No clubbing, cyanosis or edema.  DP +1 Neuro: Alert and oriented X 3. Psych:  Good affect, responds appropriately EKG: NSR rate of 63 bpm  ASSESSMENT AND PLAN

## 2011-04-21 NOTE — Patient Instructions (Signed)
Your physician has recommended that you have a pulmonary function test. Pulmonary Function Tests are a group of tests that measure how well air moves in and out of your lungs.  Your physician has recommended that you wear an event monitor. Event monitors are medical devices that record the heart's electrical activity. Doctors most often [name] these monitors to diagnose arrhythmias. Arrhythmias are problems with the speed or rhythm of the heartbeat. The monitor is a small, portable device. You can wear one while you do your normal daily activities. This is usually used to diagnose what is causing palpitations/syncope (passing out).  Your physician has recommended you make the following change in your medication: start taking Flexeril 10 mg three times daily for muscle spasms    Your physician recommends that you return for lab work in: today  Your physician recommends that you schedule a follow-up appointment in: 1 month

## 2011-04-22 LAB — TSH: TSH: 0.958 u[IU]/mL (ref 0.350–4.500)

## 2011-04-25 ENCOUNTER — Encounter (HOSPITAL_COMMUNITY): Payer: Medicaid Other

## 2011-04-25 ENCOUNTER — Ambulatory Visit (HOSPITAL_COMMUNITY)
Admission: RE | Admit: 2011-04-25 | Discharge: 2011-04-25 | Disposition: A | Discharge status: Home / Self Care | Payer: Medicaid Other | Source: Ambulatory Visit | Attending: Cardiology | Admitting: Cardiology

## 2011-04-25 ENCOUNTER — Ambulatory Visit (HOSPITAL_COMMUNITY)
Admission: RE | Admit: 2011-04-25 | Discharge: 2011-04-25 | Disposition: A | Discharge status: Home / Self Care | Payer: Medicaid Other | Source: Ambulatory Visit | Attending: Adult Health | Admitting: Adult Health

## 2011-04-25 DIAGNOSIS — R0609 Other forms of dyspnea: Secondary | ICD-10-CM

## 2011-04-25 DIAGNOSIS — R0989 Other specified symptoms and signs involving the circulatory and respiratory systems: Secondary | ICD-10-CM

## 2011-04-25 DIAGNOSIS — R0602 Shortness of breath: Secondary | ICD-10-CM | POA: Insufficient documentation

## 2011-04-25 LAB — BLOOD GAS, ARTERIAL
pCO2 arterial: 41.8 mmHg (ref 35.0–45.0)
pH, Arterial: 7.386 (ref 7.350–7.450)
pO2, Arterial: 87 mmHg (ref 80.0–100.0)

## 2011-04-25 MED ORDER — ALBUTEROL SULFATE (5 MG/ML) 0.5% IN NEBU: 2.5000 mg | INHALATION_SOLUTION | Freq: Once | RESPIRATORY_TRACT | Status: DC

## 2011-04-27 NOTE — Procedures (Signed)
Richard [name], Richard [name]                ACCOUNT NO.:  [id number]  MEDICAL RECORD NO.:  [id number]  LOCATION:                                 FACILITY:  PHYSICIAN:  Areeba Sulser L. Juanetta Gosling, M.D.DATE OF BIRTH:  [birth year]  DATE OF PROCEDURE:  04/27/2011 DATE OF DISCHARGE:                           PULMONARY FUNCTION TEST   REASON FOR PULMONARY FUNCTION TESTING:  Shortness of breath.  1. Spirometry does not show a ventilatory defect, but does show     evidence of airflow obstruction. 2. Lung volumes are normal. 3. DLCO is normal. 4. Arterial blood gas is normal. 5. There is improvement with inhaled bronchodilator but it does not     reach the level of significance. 6. This study is consistent with airflow obstruction probably COPD     considering the patient's smoking history.     Audi Wettstein L. Juanetta Gosling, M.D.     ELH/MEDQ  D:  04/27/2011  T:  04/27/2011  Job:  [id number]  cc:   Gerrit Friends. Dietrich Pates, MD, [facility] [address] [place], [place] [address]

## 2011-04-28 ENCOUNTER — Other Ambulatory Visit: Payer: Self-pay | Admitting: Cardiology

## 2011-04-29 LAB — LIPID PANEL
Cholesterol: 129 mg/dL (ref 0–200)
VLDL: 25 mg/dL (ref 0–40)

## 2011-05-01 ENCOUNTER — Encounter: Payer: Self-pay | Admitting: Gastroenterology

## 2011-05-01 ENCOUNTER — Telehealth: Payer: Self-pay | Admitting: *Deleted

## 2011-05-01 ENCOUNTER — Ambulatory Visit: Payer: Medicaid Other | Admitting: Adult Health

## 2011-05-01 ENCOUNTER — Ambulatory Visit (INDEPENDENT_AMBULATORY_CARE_PROVIDER_SITE_OTHER): Payer: Medicaid Other | Admitting: Gastroenterology

## 2011-05-01 VITALS — BP 115/74 | HR 60 | Temp 97.0°F | Ht 71.0 in | Wt 217.8 lb

## 2011-05-01 DIAGNOSIS — K219 Gastro-esophageal reflux disease without esophagitis: Secondary | ICD-10-CM

## 2011-05-01 NOTE — Assessment & Plan Note (Signed)
45 year old with new onset dyspepsia in the setting of chronic GERD, last EGD in 2003 with Dr. 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 On Nexium BID, continued breakthrough symptoms despite dietary modification, weight loss. Denies NSAIDs or aspirin powders. Sees cardiology for palpitations, "muscle spasms". Negative troponin and EKG on file. Awaiting cardiac monitor for evaluation of possible palpitations. Will need to proceed with EGD with Dr. Darrick Penna in near future.  Proceed with upper endoscopy in the near future with Dr. Darrick Penna. This will need to be done in the OR with assistance of Propofol due to polypharmacy. The risks, benefits, and alternatives have been discussed in detail with patient. He has stated understanding and desires to proceed.  Continue Nexium BID Continue f/u with cardiac

## 2011-05-01 NOTE — Progress Notes (Signed)
Referring Provider: Fredirick Maudlin, MD Primary Care Physician:  Fredirick Maudlin, MD Primary Gastroenterologist: Dr. Darrick Penna   Chief Complaint  Patient presents with  . Gastrophageal Reflux    HPI:   Richard [name] returns today in 6 mos f/u for chronic GERD. He has continued to lose weight, purposefully. He is down another 7 lbs from March of this year. He is on Nexium BID. He sees cardiology due to ongoing assessment and treatment of nonobstructive CAD, hyperlipidemia, chronic chest discomfort, palpitations.. Awaiting cardiac monitor. Reports waking up with retrosternal discomfort, felt like having a heart attack. Denies dysphagia. Reports new onset nausea with anything he drinks. Complains of early satiety. Eating less, as he feels "backed up" if he doesn't. Denies NSAIDs or aspirin powders. Last EGD in 2003 with Dr. Karilyn Cota as outlined below.  Complains of nocturnal reflux. Last meal of day is at 6pm.   Normal troponin when in ED Sept 2012. Normal EKG on file as of Oct 2012.   Past Medical History  Diagnosis Date  . Chest pain     + palpitations; cath 2005- 30-40% mid LAD, 20% D1, 20% cx, OM, 20-30% RCA, and EF-55%  . COPD (chronic obstructive pulmonary disease)   . GERD (gastroesophageal reflux disease)   . Hyperlipemia   . Hypertension   . Obesity   . Depression   . Colitis 1990  . Tobacco abuse     1/2 pack per day  . Diabetes mellitus   . S/P endoscopy 2003    Dr. 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 Colonoscopy 1990  . Shoulder surgery     Right acromioclavicular joint arthrosis  . Neck mass excision   . Cardiac catheterization     Current Outpatient Prescriptions  Medication Sig Dispense Refill  . acetaminophen (TYLENOL) 325 MG tablet Take 650 mg by mouth every 6 (six) hours as needed.        . ALPRAZolam (XANAX) 0.5 MG tablet Take 0.5 mg by mouth at bedtime as needed.        [name]  aspirin 81 MG tablet Take 81 mg by mouth daily.        [name] atorvastatin (LIPITOR) 20 MG tablet Take 1 tablet (20 mg total) by mouth at bedtime. To replace Pravastatin  30 tablet  7  . cyclobenzaprine (FLEXERIL) 10 MG tablet Take 1 tablet (10 mg total) by mouth 3 (three) times daily as needed for muscle spasms. For Muscle spasms  90 tablet  0  . esomeprazole (NEXIUM) 40 MG capsule 30 mins before BF and 30 mins before evening meal  60 capsule  5  . hydrochlorothiazide 25 MG tablet Take 12.5 mg by mouth daily.        [name] HYDROcodone-acetaminophen (VICODIN ES) 7.5-750 MG per tablet Take 1 tablet by mouth every 6 (six) hours as needed.        [name] lisinopril (PRINIVIL,ZESTRIL) 20 MG tablet TAKE ONE TABLET BY MOUTH EVERY DAY **DISCONTINUE LISINOPRIL 5 MG AND HYDROCHLOROTHIAZIDE PER DOCTOR**  30 tablet  6  . metFORMIN (GLUCOPHAGE) 500 MG tablet Take 500 mg by mouth 2 (two) times daily with a meal.        . metoprolol (TOPROL-XL) 50 MG 24 hr tablet TAKE ONE & ONE-HALF TABLETS BY MOUTH EVERY DAY  45 tablet  2  . neomycin-polymyxin-hydrocortisone (CORTISPORIN) 3.5-10000-1 otic suspension Place 5 drops into both ears 3 (three) times daily. X7  DAYS  10 mL  0    Allergies as of 05/01/2011 - Review Complete 05/01/2011  Allergen Reaction Noted  . Mushroom ext cmplx(shiitake-reishi-mait) Anaphylaxis 03/22/2011  . Penicillins      Family History  Problem Relation Age of Onset  . Lung cancer Mother   . Heart attack Father 22  . Diabetes Father   . Hypertension Brother   . Colon cancer Neg Hx   . Hypertension Brother   . Heart attack Brother 67    History   Social History  . Marital Status: Married    Spouse Name: N/A    Number of Children: N/A  . Years of Education: N/A   Occupational History  . full time    Social History Main Topics  . Smoking status: Current Everyday Smoker -- 0.5 packs/day for 80 years  . Smokeless tobacco: Never Used  . Alcohol Use: No  . Drug Use: No  . Sexually Active:  None   Other Topics Concern  . None   Social History Narrative  . None    Review of Systems: Gen: Denies fever, chills, anorexia. Denies fatigue, weakness, weight loss.  CV: Denies chest pain, + palpitations, syncope, peripheral edema, and claudication. Resp: Denies dyspnea at rest, cough, wheezing, coughing up blood, and pleurisy. GI: Denies vomiting blood, jaundice, and fecal incontinence.   Denies dysphagia or odynophagia. Derm: Denies rash, itching, dry skin Psych: Denies depression, anxiety, memory loss, confusion. No homicidal or suicidal ideation.  Heme: Denies bruising, bleeding, and enlarged lymph nodes.  Physical Exam: BP 115/74  Pulse 60  Temp(Src) 97 F (36.1 C) (Temporal)  Ht 5\' 11"  (1.803 m)  Wt 217 lb 12.8 oz (98.793 kg)  BMI 30.38 kg/m2 General:   Alert and oriented. No distress noted. Pleasant and cooperative.  Head:  Normocephalic and atraumatic. Eyes:  Conjuctiva clear without scleral icterus. Mouth:  Oral mucosa pink and moist. Good dentition. No lesions. Neck:  Supple, without mass or thyromegaly. Heart:  S1, S2 present without murmurs, rubs, or gallops. Regular rate and rhythm. Abdomen:  +BS, soft, mildly tender to palpation epigastric region, and non-distended. No rebound or guarding. No HSM or masses noted. Msk:  Symmetrical without gross deformities. Normal posture. Extremities:  Without edema. Neurologic:  Alert and  oriented x4;  grossly normal neurologically. Skin:  Intact without significant lesions or rashes. Cervical Nodes:  No significant cervical adenopathy. Psych:  Alert and cooperative. Normal mood and affect.

## 2011-05-01 NOTE — Telephone Encounter (Signed)
Message copied by Gaynelle Adu on Mon May 01, 2011 10:49 AM ------      Message from: Kathlen Brunswick      Created: Sat Apr 29, 2011  8:01 PM       Diagnostic testing reviewed; Normal or stable results.      No change in medical therapy.

## 2011-05-01 NOTE — Telephone Encounter (Signed)
Per Joni Reining, NP, advised patient to take an extra 25mg  of Metoprolol for palpitations and continue with Cardionet monitor when it arrives.  Pt verbalizes understanding.

## 2011-05-01 NOTE — Telephone Encounter (Signed)
Patient called regarding questions about COPD.  Advised patient that primary care physician can address this diagnosis and if he has further questions, they can be addressed at next office visit.

## 2011-05-01 NOTE — Patient Instructions (Signed)
Continue Nexium twice a day.  Continue your healthy diet as you have been doing.  We have set you up for an endoscopy with Dr. Darrick Penna in the near future. Further recommendations to follow once this is completed.

## 2011-05-01 NOTE — Telephone Encounter (Signed)
Spoke with patient regarding lab work.  States he continues to have fluttering in chest up into his neck and has gotten worse.  Is scheduled for Upper endoscopy on Wednesday.  Appointment scheduled for today to re-evaluate.

## 2011-05-02 ENCOUNTER — Encounter (HOSPITAL_COMMUNITY)
Admission: RE | Admit: 2011-05-02 | Discharge: 2011-05-02 | Disposition: A | Discharge status: Home / Self Care | Payer: Medicaid Other | Source: Ambulatory Visit | Attending: Gastroenterology | Admitting: Gastroenterology

## 2011-05-02 ENCOUNTER — Encounter (HOSPITAL_COMMUNITY): Payer: Self-pay

## 2011-05-02 LAB — BASIC METABOLIC PANEL
BUN: 12 mg/dL (ref 6–23)
CO2: 30 mEq/L (ref 19–32)
Calcium: 9.6 mg/dL (ref 8.4–10.5)
Glucose, Bld: 146 mg/dL — ABNORMAL HIGH (ref 70–99)
Sodium: 140 mEq/L (ref 135–145)

## 2011-05-02 LAB — CBC
HCT: 45.7 % (ref 39.0–52.0)
Hemoglobin: 15.5 g/dL (ref 13.0–17.0)
MCH: 30.9 pg (ref 26.0–34.0)
MCV: 91.2 fL (ref 78.0–100.0)
RBC: 5.01 MIL/uL (ref 4.22–5.81)

## 2011-05-02 NOTE — Patient Instructions (Signed)
20 Richard [name]  05/02/2011   Your procedure is scheduled on:  Wednesday, 05/03/11  Report to [facility] at 11:35 AM.  Call this number if you have problems the morning of surgery: [phone]   Remember:   Do not eat food:After Midnight.  Do not drink clear liquids: After Midnight.  Take these medicines the morning of surgery with A SIP OF WATER: xanax, nexium, lisinopril, metoprolol   Do not wear jewelry, make-up or nail polish.  Do not wear lotions, powders, or perfumes. You may wear deodorant.  Do not shave 48 hours prior to surgery.  Do not bring valuables to the hospital.  Contacts, dentures or bridgework may not be worn into surgery.  Leave suitcase in the car. After surgery it may be brought to your room.  For patients admitted to the hospital, checkout time is 11:00 AM the day of discharge.   Patients discharged the day of surgery will not be allowed to drive home.  Name and phone number of your driver: wife  Special Instructions:Follow any instructions given to you by Dr. Darrick Penna office   Please read over the following fact sheets that you were given: Anesthesia Post-op Instructions   Monitored Anesthesia Care (MAC)  MAC stands for monitored anesthesia care. MAC usually means a tube is not put in your trachea (windpipe). MAC may also be called moderate sedation. MAC usually involves giving intravenous anesthetic drugs, oxygen, watching vital signs and standard patient monitoring procedures similar to those used during a general anesthetic. MAC can be done without going to the operating room. MAC is typically used for small procedures that cannot be done with only local anesthesia. MAC usually means lower doses of anesthetic drugs. The recovery period tends to be shorter. The drugs used cause a lower level of awareness. This means you are partially awake and your reflexes are intact. You may hear what is being said and feel some pressure, but should not feel pain. The drugs used  may affect your ability to remember the procedure. If you have depressed consciousness and lose some protective reflexes, this is called deep sedation. If you become unconscious and fall completely asleep, this is general anesthesia. In both deep sedation and general anesthesia, the caregivers must make sure that your airway remains open. During MAC, the sedation-trained caregivers will:  Give medications which may include:   Sedatives.   Analgesics.   Hypnotics.   Other medications which are needed to keep you comfortable, safe and secure.   Give local anesthetic to numb the procedural site.   Monitor your level of consciousness.   Monitor your blood pressure.   Monitor your heart rate and rhythm.   Monitor your respirations and oxygen levels.   Monitor your airway.   Monitor your level of pain.   Evaluate and treat problems which may occur.  Some of this information is from the [employer]. Document Released: 03/29/2005 Document Re-Released: 07/25/2009 [facility] Patient Information 2011 [place], [place].

## 2011-05-03 ENCOUNTER — Encounter (HOSPITAL_COMMUNITY): Payer: Self-pay | Admitting: Anesthesiology

## 2011-05-03 ENCOUNTER — Other Ambulatory Visit: Payer: Self-pay | Admitting: Gastroenterology

## 2011-05-03 ENCOUNTER — Ambulatory Visit (HOSPITAL_COMMUNITY)
Admission: RE | Admit: 2011-05-03 | Discharge: 2011-05-03 | Disposition: A | Discharge status: Home / Self Care | Payer: Medicaid Other | Source: Ambulatory Visit | Attending: Gastroenterology | Admitting: Gastroenterology

## 2011-05-03 ENCOUNTER — Ambulatory Visit (HOSPITAL_COMMUNITY): Payer: Medicaid Other | Admitting: Anesthesiology

## 2011-05-03 ENCOUNTER — Encounter (HOSPITAL_COMMUNITY): Payer: Self-pay

## 2011-05-03 ENCOUNTER — Encounter (HOSPITAL_COMMUNITY)
Admission: RE | Disposition: A | Discharge status: Home / Self Care | Payer: Self-pay | Source: Ambulatory Visit | Attending: Gastroenterology

## 2011-05-03 DIAGNOSIS — Z79899 Other long term (current) drug therapy: Secondary | ICD-10-CM | POA: Insufficient documentation

## 2011-05-03 DIAGNOSIS — K2990 Gastroduodenitis, unspecified, without bleeding: Secondary | ICD-10-CM | POA: Insufficient documentation

## 2011-05-03 DIAGNOSIS — K2970 Gastritis, unspecified, without bleeding: Secondary | ICD-10-CM

## 2011-05-03 DIAGNOSIS — Z7982 Long term (current) use of aspirin: Secondary | ICD-10-CM | POA: Insufficient documentation

## 2011-05-03 DIAGNOSIS — K219 Gastro-esophageal reflux disease without esophagitis: Secondary | ICD-10-CM | POA: Insufficient documentation

## 2011-05-03 DIAGNOSIS — Z01812 Encounter for preprocedural laboratory examination: Secondary | ICD-10-CM | POA: Insufficient documentation

## 2011-05-03 DIAGNOSIS — J449 Chronic obstructive pulmonary disease, unspecified: Secondary | ICD-10-CM | POA: Insufficient documentation

## 2011-05-03 DIAGNOSIS — I10 Essential (primary) hypertension: Secondary | ICD-10-CM | POA: Insufficient documentation

## 2011-05-03 DIAGNOSIS — E119 Type 2 diabetes mellitus without complications: Secondary | ICD-10-CM | POA: Insufficient documentation

## 2011-05-03 DIAGNOSIS — J4489 Other specified chronic obstructive pulmonary disease: Secondary | ICD-10-CM | POA: Insufficient documentation

## 2011-05-03 HISTORY — PX: ESOPHAGOGASTRODUODENOSCOPY: SHX1529

## 2011-05-03 HISTORY — PX: BRAVO PH STUDY: SHX5421

## 2011-05-03 LAB — GLUCOSE, CAPILLARY

## 2011-05-03 SURGERY — ESOPHAGOGASTRODUODENOSCOPY (EGD) WITH PROPOFOL
Anesthesia: Monitor Anesthesia Care

## 2011-05-03 MED ORDER — STERILE WATER FOR IRRIGATION IR SOLN
Status: DC | PRN
  Administered 2011-05-03: 14:00:00

## 2011-05-03 MED ORDER — LIDOCAINE HCL 1 % IJ SOLN
INTRAMUSCULAR | Status: DC | PRN
  Administered 2011-05-03: 30 mg via INTRADERMAL

## 2011-05-03 MED ORDER — ONDANSETRON HCL 4 MG/2ML IJ SOLN
4.0000 mg | Freq: Once | INTRAMUSCULAR | Status: AC
Start: 2011-05-03 — End: 2011-05-03
  Administered 2011-05-03: 4 mg via INTRAVENOUS

## 2011-05-03 MED ORDER — PROPOFOL 10 MG/ML IV EMUL
INTRAVENOUS | Status: DC | PRN
  Administered 2011-05-03: 75 ug/kg/min via INTRAVENOUS

## 2011-05-03 MED ORDER — MIDAZOLAM HCL 2 MG/2ML IJ SOLN
INTRAMUSCULAR | Status: AC
  Filled 2011-05-03: qty 2

## 2011-05-03 MED ORDER — ONDANSETRON HCL 4 MG/2ML IJ SOLN
INTRAMUSCULAR | Status: AC
  Filled 2011-05-03: qty 2

## 2011-05-03 MED ORDER — ONDANSETRON HCL 4 MG/2ML IJ SOLN: 4.0000 mg | Freq: Once | INTRAMUSCULAR | Status: DC | PRN

## 2011-05-03 MED ORDER — GLYCOPYRROLATE 0.2 MG/ML IJ SOLN
INTRAMUSCULAR | Status: AC
  Filled 2011-05-03: qty 1

## 2011-05-03 MED ORDER — FENTANYL CITRATE 0.05 MG/ML IJ SOLN: 25.0000 ug | INTRAMUSCULAR | Status: DC | PRN

## 2011-05-03 MED ORDER — BUTAMBEN-TETRACAINE-BENZOCAINE 2-2-14 % EX AERO
1.0000 | INHALATION_SPRAY | Freq: Once | CUTANEOUS | Status: AC
  Administered 2011-05-03: 1 via TOPICAL

## 2011-05-03 MED ORDER — GLYCOPYRROLATE 0.2 MG/ML IJ SOLN
0.2000 mg | Freq: Once | INTRAMUSCULAR | Status: AC
  Administered 2011-05-03: 0.2 mg via INTRAVENOUS

## 2011-05-03 MED ORDER — MIDAZOLAM HCL 5 MG/5ML IJ SOLN
INTRAMUSCULAR | Status: DC | PRN
  Administered 2011-05-03: 2 mg via INTRAVENOUS

## 2011-05-03 MED ORDER — MIDAZOLAM HCL 2 MG/2ML IJ SOLN
1.0000 mg | INTRAMUSCULAR | Status: DC | PRN
  Administered 2011-05-03 (×2): 2 mg via INTRAVENOUS

## 2011-05-03 MED ORDER — PROPOFOL 10 MG/ML IV EMUL
INTRAVENOUS | Status: AC
  Filled 2011-05-03: qty 20

## 2011-05-03 MED ORDER — LACTATED RINGERS IV SOLN
INTRAVENOUS | Status: DC
  Administered 2011-05-03: 13:00:00 via INTRAVENOUS

## 2011-05-03 SURGICAL SUPPLY — 17 items
BLOCK BITE 60FR ADLT L/F BLUE (MISCELLANEOUS) ×3 IMPLANT
CAPSULE BRAVO RADIO TELEMETRY (MISCELLANEOUS) ×3 IMPLANT
ELECT REM PT RETURN 9FT ADLT (ELECTROSURGICAL)
ELECTRODE REM PT RTRN 9FT ADLT (ELECTROSURGICAL) IMPLANT
FLOOR PAD 36X40 (MISCELLANEOUS) ×3
FORCEP RJ3 GP 1.8X160 W-NEEDLE (CUTTING FORCEPS) IMPLANT
FORCEPS BIOP RAD 4 LRG CAP 4 (CUTTING FORCEPS) ×3 IMPLANT
NEEDLE SCLEROTHERAPY 25GX240 (NEEDLE) IMPLANT
PAD FLOOR 36X40 (MISCELLANEOUS) ×2 IMPLANT
PROBE APC STR FIRE (PROBE) IMPLANT
PROBE INJECTION GOLD (MISCELLANEOUS)
PROBE INJECTION GOLD 7FR (MISCELLANEOUS) IMPLANT
SNARE SHORT THROW 13M SML OVAL (MISCELLANEOUS) IMPLANT
SYR 50ML LL SCALE MARK (SYRINGE) ×3 IMPLANT
TUBING ENDO SMARTCAP PENTAX (MISCELLANEOUS) ×6 IMPLANT
TUBING IRRIGATION ENDOGATOR (MISCELLANEOUS) ×3 IMPLANT
WATER STERILE IRR 1000ML POUR (IV SOLUTION) IMPLANT

## 2011-05-03 NOTE — Anesthesia Preprocedure Evaluation (Addendum)
Anesthesia Evaluation  Name, MR# and DOB Patient awake  General Assessment Comment  Reviewed: Allergy & Precautions, H&P , NPO status , Patient's Chart, lab work & pertinent test results, reviewed documented beta blocker date and time   History of Anesthesia Complications Negative for: history of anesthetic complications  Airway Mallampati: I  Neck ROM: Full    Dental  (+) Poor Dentition, Missing and Chipped   Pulmonary COPDCurrent Smoker    Pulmonary exam normal       Cardiovascular hypertension, Pt. on medications Regular Normal    Neuro/Psych PSYCHIATRIC DISORDERS Depression    GI/Hepatic GERD Medicated and Poorly Controlled  Endo/Other  Diabetes mellitus-, Well Controlled, Type 2, Oral Hypoglycemic Agents  Renal/GU      Musculoskeletal   Abdominal   Peds  Hematology   Anesthesia Other Findings   Reproductive/Obstetrics                          Anesthesia Physical Anesthesia Plan  ASA: III  Anesthesia Plan: MAC   Post-op Pain Management:    Induction: Intravenous  Airway Management Planned: Simple Face Mask  Additional Equipment:   Intra-op Plan:   Post-operative Plan:   Informed Consent: I have reviewed the patients History and Physical, chart, labs and discussed the procedure including the risks, benefits and alternatives for the proposed anesthesia with the patient or authorized representative who has indicated his/her understanding and acceptance.     Plan Discussed with:   Anesthesia Plan Comments:         Anesthesia Quick Evaluation

## 2011-05-03 NOTE — Interval H&P Note (Signed)
History and Physical Interval Note:   05/03/2011   11:43 AM   Richard [name]  has presented today for surgery, with the diagnosis of GERD  The various methods of treatment have been discussed with the patient and family. After consideration of risks, benefits and other options for treatment, the patient has consented to  Procedure(s): ESOPHAGOGASTRODUODENOSCOPY (EGD) WITH PROPOFOL as a surgical intervention .  I have reviewed the patients' chart and labs.  Questions were answered to the patient's satisfaction.     Jonette Eva  MD  THE PATIENT WAS EXAMINED AND THERE IS NO CHANGE IN THE PATIENT'S CONDITION SINCE THE ORIGINAL H&P WAS COMPLETED.

## 2011-05-03 NOTE — H&P (Signed)
BP Pulse Temp(Src) Ht Wt BMI    115/74  60  97 F (36.1 C) (Temporal)  5\' 11"  (1.803 m)  217 lb 12.8 oz (98.793 kg)  30.38 kg/m2       Progress Notes     Gerrit Halls, NP  05/01/2011 11:26 AM  Signed   Referring Provider: Fredirick Maudlin, MD Primary Care Physician:  Fredirick Maudlin, MD Primary Gastroenterologist: Dr. Darrick Penna     Chief Complaint   Patient presents with   .  Gastrophageal Reflux      HPI:    Mr. Feldpausch returns today in 6 mos f/u for chronic GERD. He has continued to lose weight, purposefully. He is down another 7 lbs from March of this year. He is on Nexium BID. He sees cardiology due to ongoing assessment and treatment of nonobstructive CAD, hyperlipidemia, chronic chest discomfort, palpitations.. Awaiting cardiac monitor. Reports waking up with retrosternal discomfort, felt like having a heart attack. Denies dysphagia. Reports new onset nausea with anything he drinks. Complains of early satiety. Eating less, as he feels "backed up" if he doesn't. Denies NSAIDs or aspirin powders. Last EGD in 2003 with Dr. Karilyn Cota as outlined below.   Complains of nocturnal reflux. Last meal of day is at 6pm.    Normal troponin when in ED Sept 2012. Normal EKG on file as of Oct 2012.     Past Medical History   Diagnosis  Date   .  Chest pain         + palpitations; cath 2005- 30-40% mid LAD, 20% D1, 20% cx, OM, 20-30% RCA, and EF-55%   .  COPD (chronic obstructive pulmonary disease)     .  GERD (gastroesophageal reflux disease)     .  Hyperlipemia     .  Hypertension     .  Obesity     .  Depression     .  Colitis  1990   .  Tobacco abuse         1/2 pack per day   .  Diabetes mellitus     .  S/P endoscopy  2003       Dr. Karilyn Cota: erosive esophagitis, ulcer at GE junction, negative H.pylori seroloy       Past Surgical History   Procedure  Date   .  Colonoscopy  1990   .  Shoulder surgery         Right acromioclavicular joint arthrosis   .  Neck mass excision      .  Cardiac catheterization         Current Outpatient Prescriptions   Medication  Sig  Dispense  Refill   .  acetaminophen (TYLENOL) 325 MG tablet  Take 650 mg by mouth every 6 (six) hours as needed.           .  ALPRAZolam (XANAX) 0.5 MG tablet  Take 0.5 mg by mouth at bedtime as needed.           [name]  aspirin 81 MG tablet  Take 81 mg by mouth daily.           [name]  atorvastatin (LIPITOR) 20 MG tablet  Take 1 tablet (20 mg total) by mouth at bedtime. To replace Pravastatin   30 tablet   7   .  cyclobenzaprine (FLEXERIL) 10 MG tablet  Take 1 tablet (10 mg total) by mouth 3 (three) times daily as needed for muscle  spasms. For Muscle spasms   90 tablet   0   .  esomeprazole (NEXIUM) 40 MG capsule  30 mins before BF and 30 mins before evening meal   60 capsule   5   .  hydrochlorothiazide 25 MG tablet  Take 12.5 mg by mouth daily.           [name]  HYDROcodone-acetaminophen (VICODIN ES) 7.5-750 MG per tablet  Take 1 tablet by mouth every 6 (six) hours as needed.           [name]  lisinopril (PRINIVIL,ZESTRIL) 20 MG tablet  TAKE ONE TABLET BY MOUTH EVERY DAY **DISCONTINUE LISINOPRIL 5 MG AND HYDROCHLOROTHIAZIDE PER DOCTOR**   30 tablet   6   .  metFORMIN (GLUCOPHAGE) 500 MG tablet  Take 500 mg by mouth 2 (two) times daily with a meal.           .  metoprolol (TOPROL-XL) 50 MG 24 hr tablet  TAKE ONE & ONE-HALF TABLETS BY MOUTH EVERY DAY   45 tablet   2   .  neomycin-polymyxin-hydrocortisone (CORTISPORIN) 3.5-10000-1 otic suspension  Place 5 drops into both ears 3 (three) times daily. X7 DAYS   10 mL   0       Allergies as of 05/01/2011 - Review Complete 05/01/2011   Allergen  Reaction  Noted   .  Mushroom ext cmplx(shiitake-reishi-mait)  Anaphylaxis  03/22/2011   .  Penicillins           Family History   Problem  Relation  Age of Onset   .  Lung cancer  Mother     .  Heart attack  Father  12   .  Diabetes  Father     .  Hypertension  Brother     .  Colon cancer  Neg Hx     .  Hypertension  Brother       .  Heart attack  Brother  37       History       Social History   .  Marital Status:  Married       Spouse Name:  N/A       Number of Children:  N/A   .  Years of Education:  N/A       Occupational History   .  full time         Social History Main Topics   .  Smoking status:  Current Everyday Smoker -- 0.5 packs/day for 80 years   .  Smokeless tobacco:  Never Used   .  Alcohol Use:  No   .  Drug Use:  No   .  Sexually Active:  None       Other Topics  Concern   .  None       Social History Narrative   .  None      Review of Systems: Gen: Denies fever, chills, anorexia. Denies fatigue, weakness, weight loss.   CV: Denies chest pain, + palpitations, syncope, peripheral edema, and claudication. Resp: Denies dyspnea at rest, cough, wheezing, coughing up blood, and pleurisy. GI: Denies vomiting blood, jaundice, and fecal incontinence.   Denies dysphagia or odynophagia. Derm: Denies rash, itching, dry skin Psych: Denies depression, anxiety, memory loss, confusion. No homicidal or suicidal ideation.   Heme: Denies bruising, bleeding, and enlarged lymph nodes.   Physical Exam: BP 115/74  Pulse 60  Temp(Src) 97 F (36.1 C) (Temporal)  Ht 5'  11" (1.803 m)  Wt 217 lb 12.8 oz (98.793 kg)  BMI 30.38 kg/m2 General:   Alert and oriented. No distress noted. Pleasant and cooperative.   Head:  Normocephalic and atraumatic. Eyes:  Conjuctiva clear without scleral icterus. Mouth:  Oral mucosa pink and moist. Good dentition. No lesions. Neck:  Supple, without mass or thyromegaly. Heart:  S1, S2 present without murmurs, rubs, or gallops. Regular rate and rhythm. Abdomen:  +BS, soft, mildly tender to palpation epigastric region, and non-distended. No rebound or guarding. No HSM or masses noted. Msk:  Symmetrical without gross deformities. Normal posture. Extremities:  Without edema. Neurologic:  Alert and  oriented x4;  grossly normal neurologically. Skin:  Intact without  significant lesions or rashes. Cervical Nodes:  No significant cervical adenopathy. Psych:  Alert and cooperative. Normal mood and affect.        GASTROESOPHAGEAL REFLUX DISEASE - Gerrit Halls, NP  05/01/2011 11:25 AM  Signed 45 year old with new onset dyspepsia in the setting of chronic GERD, last EGD in 2003 with Dr. 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 On Nexium BID, continued breakthrough symptoms despite dietary modification, weight loss. Denies NSAIDs or aspirin powders. Sees cardiology for palpitations, "muscle spasms". Negative troponin and EKG on file. Awaiting cardiac monitor for evaluation of possible palpitations. Will need to proceed with EGD with Dr. Darrick Penna in near future.   Proceed with upper endoscopy in the near future with Dr. Darrick Penna. This will need to be done in the OR with assistance of Propofol due to polypharmacy. The risks, benefits, and alternatives have been discussed in detail with patient. He has stated understanding and desires to proceed.  Continue Nexium BID Continue f/u with cardiac

## 2011-05-03 NOTE — Transfer of Care (Signed)
Immediate Anesthesia Transfer of Care Note  Patient: Richard [name]  Procedure(s) Performed:  ESOPHAGOGASTRODUODENOSCOPY (EGD) WITH PROPOFOL - GE junction is 44cm from teeth; BRAVO PH STUDY  Patient Location: PACU  Anesthesia Type: MAC  Level of Consciousness: awake, alert , oriented and patient cooperative  Airway & Oxygen Therapy: Patient Spontanous Breathing  Post-op Assessment: Report given to PACU RN, Post -op Vital signs reviewed and stable, Patient moving all extremities and Patient moving all extremities X 4  Post vital signs: Reviewed and stable  Complications: No apparent anesthesia complications

## 2011-05-03 NOTE — Anesthesia Postprocedure Evaluation (Signed)
  Anesthesia Post-op Note  Patient: Richard [name]  Procedure(s) Performed:  ESOPHAGOGASTRODUODENOSCOPY (EGD) WITH PROPOFOL - GE junction is 44cm from teeth; BRAVO PH STUDY  Patient Location: PACU  Anesthesia Type: MAC  Level of Consciousness: awake, alert , oriented and patient cooperative  Airway and Oxygen Therapy: Patient Spontanous Breathing  Post-op Pain: none  Post-op Assessment: Post-op Vital signs reviewed, Patient's Cardiovascular Status Stable, Respiratory Function Stable, Patent Airway and No signs of Nausea or vomiting  Post-op Vital Signs: Reviewed and stable  Complications: No apparent anesthesia complications

## 2011-05-04 ENCOUNTER — Other Ambulatory Visit: Payer: Self-pay | Admitting: Gastroenterology

## 2011-05-04 ENCOUNTER — Telehealth: Payer: Self-pay | Admitting: Gastroenterology

## 2011-05-04 DIAGNOSIS — K219 Gastro-esophageal reflux disease without esophagitis: Secondary | ICD-10-CM

## 2011-05-04 MED ORDER — BUTAMBEN-TETRACAINE-BENZOCAINE 2-2-14 % EX AERO: 1.0000 | INHALATION_SPRAY | CUTANEOUS | Status: DC | PRN

## 2011-05-04 NOTE — Telephone Encounter (Signed)
I called in cetacaine spray. 1 spray prn. Should feel better in the next 24 hours.

## 2011-05-04 NOTE — Telephone Encounter (Signed)
Dr Karilyn Cota called- said he received a call last evening from Richard [name] wife - pt was complaining of sore throat and wanted something for pain- Dr Karilyn Cota was unable to call anything in due to the time and advised them to have pt remain on soft foods- He would like for someone to call the pt to follow up

## 2011-05-05 ENCOUNTER — Encounter (HOSPITAL_COMMUNITY): Payer: Self-pay | Admitting: Cardiology

## 2011-05-05 DIAGNOSIS — K219 Gastro-esophageal reflux disease without esophagitis: Secondary | ICD-10-CM

## 2011-05-08 ENCOUNTER — Telehealth: Payer: Self-pay | Admitting: *Deleted

## 2011-05-08 ENCOUNTER — Encounter: Payer: Self-pay | Admitting: *Deleted

## 2011-05-08 ENCOUNTER — Telehealth: Payer: Self-pay | Admitting: Gastroenterology

## 2011-05-08 ENCOUNTER — Other Ambulatory Visit: Payer: Self-pay | Admitting: Gastroenterology

## 2011-05-08 ENCOUNTER — Encounter (HOSPITAL_COMMUNITY): Payer: Self-pay | Admitting: Gastroenterology

## 2011-05-08 DIAGNOSIS — K219 Gastro-esophageal reflux disease without esophagitis: Secondary | ICD-10-CM

## 2011-05-08 NOTE — Telephone Encounter (Signed)
C/O throat still hurting and feels like a knot in his throat hast gotten the throat spray yet because [facility] didn't have it/ Please advise??

## 2011-05-08 NOTE — Telephone Encounter (Signed)
I called and spoke to Rhi at [facility]. She said the Cetacaine spray was $65.00 and Medicaid would not pay for pt. Call  was transferred to [facility] and she is trying to decide what for pt to have.

## 2011-05-08 NOTE — Telephone Encounter (Signed)
Unable to reach regarding PFT results.  Will send letter.

## 2011-05-08 NOTE — Telephone Encounter (Signed)
Per Gerrit Halls, NP, called and scheduled pt an ov for tomorrow morning and told him to use chloraseptic spray today OTC.

## 2011-05-08 NOTE — Telephone Encounter (Signed)
Yes, chloraseptic not cetacaine as I had originally put. Thank you.

## 2011-05-08 NOTE — Telephone Encounter (Signed)
Message copied by Gaynelle Adu on Mon May 08, 2011  9:01 AM ------      Message from: Kathlen Brunswick      Created: Sun May 07, 2011  1:59 PM       Diagnostic testing reviewed; Normal or stable results.      No change in medical therapy.

## 2011-05-08 NOTE — Telephone Encounter (Signed)
I called pt. He has not tried another pharmacy to get his throat spray. i told him to see if [facility] could send it to another pharmacy for him. He said he will try and let me know tomorrow if there is a problem.

## 2011-05-09 ENCOUNTER — Encounter: Payer: Self-pay | Admitting: Gastroenterology

## 2011-05-09 ENCOUNTER — Ambulatory Visit (INDEPENDENT_AMBULATORY_CARE_PROVIDER_SITE_OTHER): Payer: Medicaid Other | Admitting: Gastroenterology

## 2011-05-09 ENCOUNTER — Ambulatory Visit (HOSPITAL_COMMUNITY)
Admission: RE | Admit: 2011-05-09 | Discharge: 2011-05-09 | Disposition: A | Discharge status: Home / Self Care | Payer: Medicaid Other | Source: Ambulatory Visit | Attending: Gastroenterology | Admitting: Gastroenterology

## 2011-05-09 VITALS — BP 113/68 | HR 55 | Temp 97.8°F | Ht 71.0 in | Wt 215.8 lb

## 2011-05-09 DIAGNOSIS — R1310 Dysphagia, unspecified: Secondary | ICD-10-CM | POA: Insufficient documentation

## 2011-05-09 DIAGNOSIS — Z9889 Other specified postprocedural states: Secondary | ICD-10-CM | POA: Insufficient documentation

## 2011-05-09 NOTE — Patient Instructions (Signed)
We have set you up for an xray. We will be calling you today with the results.   You will need to make an appointment with Dr. Juanetta Gosling to look at your throat after we have the results from this study.

## 2011-05-09 NOTE — Assessment & Plan Note (Signed)
45 year old male s/p EGD with Bravo placement on 10/17, findings of mild gastritis, negative H.pylori. Reports "tearing" pain right side of neck with swallowing, also reports chest discomfort to bilateral areas of mid-sternum. Sensation of pills "hanging" up in cervical esophagus. No tenderness to palpation, no fever/chills, or tachycardia. No nausea or vomiting. Decreased po intake due to pain. Using chloraseptic lozenges as needed. Slight improvement in symptoms overall, but still continues to complain of significant discomfort. Beefy red oropharynx, concern for other etiology to be causing symptoms other than GI origin. However, as he is almost 1 week out from EGD and continuing to complain of dysphagia/odynophagia, significant pain, we will proceed with UGI with gastrografin. If negative, barium swallow to be performed. Doubt dealing with acute process here but would like to assess due to symptomatology. Further recommendations after study.

## 2011-05-09 NOTE — Progress Notes (Signed)
Referring Provider: Fredirick Maudlin, MD Primary Care Physician:  Fredirick Maudlin, MD  Chief Complaint  Patient presents with  . Dysphagia    hurting in throath    HPI:   Richard [name] is a 45 year old male who presents today after EGD done 10/17, not quite a week ago. EGD with findings of mild gastritis, Bravo placement during procedure. He called in last week with complaints of sore throat; he has been using chloraseptic lozenges prn since that time.   States since procedure, anything he swallows feels like ripping his throat out, setting on fire. Next day after procedure, ate scrambled eggs, every swallow feels like it is ripping on the right side. Feels like pills get hung when he swallows. Decreased po intake due to pain. Mainly liquids, Gatorade.  Denies hoarseness. No fever or chills. No tachycardia or shortness of breath. No nausea or vomiting.  States "knot" on right side of anterior throat (not palpable but an actual sensation) feels like it used to be about the size of a golf ball, now feels about the size of a grape. Pain is gradually getting better. When drinks something cold, hurts the worst.  Also reports chest discomfort to right and left of mid-sternum since procedure, reported feeling like a "fist" hit him. Worsened when laying down, stretching.   He does report chronic sinus issues for at least the past year.   Past Medical History  Diagnosis Date  . Chest pain     + palpitations; cath 2005- 30-40% mid LAD, 20% D1, 20% cx, OM, 20-30% RCA, and EF-55%  . COPD (chronic obstructive pulmonary disease)   . GERD (gastroesophageal reflux disease)   . Hyperlipemia   . Hypertension   . Obesity   . Depression   . Colitis 1990  . Tobacco abuse     1/2 pack per day  . Diabetes mellitus   . S/P endoscopy 2003; 2012    2003: Dr. Karilyn Cota: erosive esophagitis, ulcer at GE junction, negative H.pylori serology  2012: Dr. Darrick Penna, mild gastritis, Bravo placement, path negative H.pylori    . Fatty liver disease, nonalcoholic     Past Surgical History  Procedure Date  . Colonoscopy 1990  . Shoulder surgery     Right acromioclavicular joint arthrosis  . Neck mass excision   . Cardiac catheterization   . Bravo ph study 05/03/2011    Procedure: BRAVO PH STUDY;  Surgeon: Arlyce Harman, MD;  Location: [facility];  Service: Endoscopy;;    Current Outpatient Prescriptions  Medication Sig Dispense Refill  . acetaminophen (TYLENOL) 325 MG tablet Take 650 mg by mouth every 6 (six) hours as needed.        . ALPRAZolam (XANAX) 0.5 MG tablet Take 0.5 mg by mouth at bedtime as needed.        [name] aspirin 81 MG tablet Take 81 mg by mouth daily.        [name] atorvastatin (LIPITOR) 20 MG tablet Take 1 tablet (20 mg total) by mouth at bedtime. To replace Pravastatin  30 tablet  7  . cyclobenzaprine (FLEXERIL) 10 MG tablet Take 1 tablet (10 mg total) by mouth 3 (three) times daily as needed for muscle spasms. For Muscle spasms  90 tablet  0  . esomeprazole (NEXIUM) 40 MG capsule 30 mins before BF and 30 mins before evening meal  60 capsule  5  . hydrochlorothiazide 25 MG tablet Take 12.5 mg by mouth daily.        [name]  HYDROcodone-acetaminophen (VICODIN ES) 7.5-750 MG per tablet Take 1 tablet by mouth every 6 (six) hours as needed.        [name] lisinopril (PRINIVIL,ZESTRIL) 20 MG tablet TAKE ONE TABLET BY MOUTH EVERY DAY **DISCONTINUE LISINOPRIL 5 MG AND HYDROCHLOROTHIAZIDE PER DOCTOR**  30 tablet  6  . metFORMIN (GLUCOPHAGE) 500 MG tablet Take 500 mg by mouth 2 (two) times daily with a meal.        . metoprolol (TOPROL-XL) 50 MG 24 hr tablet TAKE ONE & ONE-HALF TABLETS BY MOUTH EVERY DAY  45 tablet  2  . neomycin-polymyxin-hydrocortisone (CORTISPORIN) 3.5-10000-1 otic suspension Place 5 drops into both ears 3 (three) times daily. X7 DAYS  10 mL  0    Allergies as of 05/09/2011 - Review Complete 05/09/2011  Allergen Reaction Noted  . Mushroom ext cmplx(shiitake-reishi-mait) Anaphylaxis 03/22/2011  .  Penicillins      Family History  Problem Relation Age of Onset  . Lung cancer Mother   . Heart attack Father 10  . Diabetes Father   . Hypertension Brother   . Colon cancer Neg Hx   . Hypertension Brother   . Heart attack Brother 40    History   Social History  . Marital Status: Married    Spouse Name: N/A    Number of Children: N/A  . Years of Education: N/A   Occupational History  . full time    Social History Main Topics  . Smoking status: Current Everyday Smoker -- 0.5 packs/day for 25 years    Types: Cigarettes  . Smokeless tobacco: Never Used  . Alcohol Use: No  . Drug Use: No  . Sexually Active: None     Review of Systems: Gen: Denies fever, chills, anorexia. Denies fatigue, weakness, weight loss.  CV: Denies  syncope, peripheral edema, and claudication. Resp: Denies dyspnea at rest, cough, wheezing, coughing up blood, and pleurisy. GI: Denies vomiting blood, jaundice, and fecal incontinence. SEE HPI. Derm: Denies rash, itching, dry skin Psych: Denies depression, anxiety, memory loss, confusion. No homicidal or suicidal ideation.  Heme: Denies bruising, bleeding, and enlarged lymph nodes.  Physical Exam: BP 113/68  Pulse 55  Temp(Src) 97.8 F (36.6 C) (Temporal)  Ht 5\' 11"  (1.803 m)  Wt 215 lb 12.8 oz (97.886 kg)  BMI 30.10 kg/m2 General:   Alert and oriented. No distress noted. Pleasant and cooperative.  Head:  Normocephalic and atraumatic. Eyes:  Conjuctiva clear without scleral icterus. Mouth:  Oral mucosa pink and moist. Beefy red oropharynx, no lesions or exudate.  Neck:  Supple, without mass or thyromegaly. No tenderness Heart:  S1, S2 present without murmurs, rubs, or gallops. Regular rate and rhythm. Abdomen:  +BS, soft, non-tender and non-distended. No rebound or guarding. No HSM or masses noted. Msk:  Symmetrical without gross deformities. Normal posture. Extremities:  Without edema. Neurologic:  Alert and  oriented x4;  grossly normal  neurologically. Skin:  Intact without significant lesions or rashes. Cervical Nodes:  No significant cervical adenopathy. Psych:  Alert and cooperative. Normal mood and affect.

## 2011-05-09 NOTE — Progress Notes (Signed)
Cc to PCP 

## 2011-05-10 NOTE — Progress Notes (Signed)
Quick Note:  Pt was informed. York Spaniel he is beginning to feel better now. ______

## 2011-05-10 NOTE — Progress Notes (Signed)
Quick Note:  Please inform pt this is normal. Reassure him he should start feeling better soon. He needs to chew well, eat slowly, and avoid fatty foods.  Continue chloraseptic spray/lozenges prn. ______

## 2011-05-13 ENCOUNTER — Telehealth: Payer: Self-pay | Admitting: Gastroenterology

## 2011-05-13 NOTE — Telephone Encounter (Signed)
TRIED TO CALL PT. NON-WORKING NUMBER. Pt Bravo shows GERD controlled with Nexium BID. Need IMP up to 30 mg qhs for Non-ulcer Dyspepsia (NUD). OPV JAN 2013-e:30 visit.  SEND PT TO CONTACT OFFICE FOR RESULTS.

## 2011-05-13 NOTE — Brief Op Note (Signed)
05/03/2011  1:28 PM  PATIENT:  Richard [name]  45 y.o. male  PRE-OPERATIVE DIAGNOSIS:  GERD "UNCONTROLLED" ON NEXIUM BID. PT DID NOT TAKE NEXIUM THE AM OF HIS PROCEDURE  POST-OPERATIVE DIAGNOSIS:  NON-ULCER DYSPEPSIA   PROCEDURE:  Procedure(s): BRAVO PH STUDY ON NEXIUM BID  SURGEON:  Surgeon(s): Arlyce Harman, MD  FINDINGS:  PT HAD RECORDER FOR 1 DAY AND 20 HOURS   FRACTION Ph TOTAL:  DAY 1 5.9  DAY 2 0   # OF REFLUXES:    DAY 1 62  DAY 2 4   LONG REFLUX > 5:   DAY 1 1  DAY 2 1    LONGEST REFLUX:   DAY 1 32 DAY 2 0   REFLUX TABLE: UPRIGHT 45 EPISODES, SUPINE 21, POSTPRANDIAL 12  DEMEESTER SCORE DAY 1:  27.2 (NL < 14.72)  DEMEESTER SCORE DAY 2:  0.7 SAP TABLE: ACID REFLUX ANALYSIS: 97.2 % SUPINE  DIAGNOSIS: NON-ULCER DYSPEPSIA  PLAN: 1. ADD IMIPRAMINE 10 MG QHS AND INCREASE TO 30 MG QHS. 2. CONTINUE NEXIUM BID. 3. OPV IN JAN 2013-E:30 VISIT.

## 2011-05-15 ENCOUNTER — Encounter: Payer: Self-pay | Admitting: Gastroenterology

## 2011-05-15 ENCOUNTER — Telehealth: Payer: Self-pay

## 2011-05-15 NOTE — Telephone Encounter (Signed)
Called pt and informed. He is aware he will need Imipramine. (Call to [facility] when Rx info available) Sending to Dr. Darrick Penna for the order.

## 2011-05-15 NOTE — Telephone Encounter (Signed)
REVIEWED. AGREE. 

## 2011-05-15 NOTE — Telephone Encounter (Signed)
Mailed pt appt card for 07/20/11 at 10 with SF in E30 spot

## 2011-05-15 NOTE — Telephone Encounter (Signed)
Dr. Darrick Penna called and gave order for Imapramine 10mg  #90. One tablet nightly x 3 nights, then two tablets nightly x 3 days then take 3 tablets nightly with 5 refills. Called to [facility] pharmacy and left on Vm. Called and informed pt that it had been called in.

## 2011-05-15 NOTE — Telephone Encounter (Signed)
Results Cc to PCP  

## 2011-05-16 ENCOUNTER — Encounter: Payer: Self-pay | Admitting: Adult Health

## 2011-05-17 ENCOUNTER — Telehealth: Payer: Self-pay | Admitting: Adult Health

## 2011-05-17 NOTE — Telephone Encounter (Signed)
Agree that Dr. Juanetta Gosling is best suited to assist with patient's pulmonary issues.

## 2011-05-17 NOTE — Telephone Encounter (Signed)
PT WAS TOLD THAT HE HAS COPD AND WOULD LIKE TO KNOW IF WE ARE GOING TO CALL HIM IN ANY RX CARE FOR THIS.  I LET HIM KNOW THAT WE WOULD MOST LIKELY BE REFERRING HIM TO ANOTHER DOCTOR FOR THIS.  HIS INSURANCE ENDS TODAY AND WOULD LIKE TO GET RX NOW IF NEEDED.

## 2011-05-19 ENCOUNTER — Ambulatory Visit: Payer: Medicaid Other | Admitting: Adult Health

## 2011-05-29 ENCOUNTER — Telehealth: Payer: Self-pay | Admitting: Adult Health

## 2011-05-29 NOTE — Telephone Encounter (Signed)
PT NEEDS TO KNOW HOW LONG HE IS TO WEAR MONITOR FOR. HE STARTED ON IT 05/25/11/TMJ

## 2011-06-12 ENCOUNTER — Telehealth: Payer: Self-pay | Admitting: Cardiology

## 2011-06-12 NOTE — Telephone Encounter (Signed)
PT IS ON GEN LIPITOR AND NEEDS TO KNOW IF THERE IS ANYTHING ON THE $4 PLAN THAT HE CAN TAKE IN PLACE SINCE HE DOESN'T HAVE INSURANCE NOW

## 2011-06-12 NOTE — Telephone Encounter (Signed)
Please advise./LV 

## 2011-06-14 NOTE — Telephone Encounter (Signed)
Seen October.

## 2011-06-15 MED ORDER — LOVASTATIN 20 MG PO TABS: 20.0000 mg | ORAL_TABLET | Freq: Every day | ORAL | Status: DC

## 2011-06-15 NOTE — Progress Notes (Signed)
EGD/BRAVO NUD ADDED IMIPRAMINE

## 2011-06-15 NOTE — Telephone Encounter (Signed)
**Note De-Identified Kollyn Lingafelter Obfuscation** No answer and no way to leave message. Will continue to call./LV

## 2011-06-15 NOTE — Telephone Encounter (Signed)
**Note De-Identified Nikyla Navedo Obfuscation** Pt. advised to replace Lipitor with Lovastatin 20 mg qhs, he states he understands instructions given. RX sent to [facility] for refill./LV

## 2011-06-15 NOTE — Telephone Encounter (Signed)
Lovastatin 20 mg #90 one po at HS

## 2011-06-15 NOTE — Telephone Encounter (Signed)
PT WILL BE OUT TODAY NEEDS TO KNOW ASAP PLEASE

## 2011-06-19 ENCOUNTER — Telehealth: Payer: Self-pay | Admitting: Gastroenterology

## 2011-06-19 ENCOUNTER — Ambulatory Visit: Payer: Self-pay | Admitting: Adult Health

## 2011-06-19 NOTE — Telephone Encounter (Signed)
REVIEWED.  

## 2011-06-19 NOTE — Telephone Encounter (Signed)
Pts wife called asking if we had any samples of Nexium for her husband- also asked about filling out paperwork to get assistance to get Nexium- he does not have insurance - she can be reached at [phone]

## 2011-06-19 NOTE — Telephone Encounter (Signed)
Samples of Nexium #20 left at front desk. Paper work for Assistance also. Pt's wife aware.

## 2011-06-22 ENCOUNTER — Other Ambulatory Visit: Payer: Self-pay | Admitting: Cardiology

## 2011-06-22 ENCOUNTER — Encounter: Payer: Self-pay | Admitting: Adult Health

## 2011-06-26 ENCOUNTER — Ambulatory Visit: Payer: Self-pay | Admitting: Adult Health

## 2011-07-04 ENCOUNTER — Telehealth: Payer: Self-pay

## 2011-07-04 ENCOUNTER — Ambulatory Visit (INDEPENDENT_AMBULATORY_CARE_PROVIDER_SITE_OTHER): Payer: Self-pay | Admitting: Adult Health

## 2011-07-04 ENCOUNTER — Encounter: Payer: Self-pay | Admitting: Adult Health

## 2011-07-04 DIAGNOSIS — R002 Palpitations: Secondary | ICD-10-CM

## 2011-07-04 DIAGNOSIS — I10 Essential (primary) hypertension: Secondary | ICD-10-CM

## 2011-07-04 DIAGNOSIS — F17200 Nicotine dependence, unspecified, uncomplicated: Secondary | ICD-10-CM

## 2011-07-04 MED ORDER — MAGNESIUM OXIDE 400 MG PO TABS: 400.0000 mg | ORAL_TABLET | Freq: Two times a day (BID) | ORAL | Status: DC

## 2011-07-04 MED ORDER — METOPROLOL SUCCINATE ER 50 MG PO TB24: 50.0000 mg | ORAL_TABLET | Freq: Every day | ORAL | Status: DC

## 2011-07-04 NOTE — Telephone Encounter (Signed)
OK. Give PA to responsible extender.

## 2011-07-04 NOTE — Assessment & Plan Note (Signed)
Cessation is discussed.

## 2011-07-04 NOTE — Telephone Encounter (Signed)
Sorry, this is for patient assistance, forward to Landa.

## 2011-07-04 NOTE — Progress Notes (Signed)
HPI: [name] is a 45 y/o patient of Dr.Rothbart we are following for ongoing assessment and treatment for non-obstructive CAD, hyperlipidemia, chronic palpitations.  He continues complaints of nightly palpitations that do not allow him to sleep or lie on his back or right side.  He states that he continues to drink caffeinated beverages, along with tobacco. He only feels the palpitations at night.  He is medically compliant with BB which he takes each pm.  No chest pain or shortness of breath is noted.  Allergies  Allergen Reactions  . Mushroom Ext Cmplx(Shiitake-Reishi-Mait) Anaphylaxis    Rapid heart rate.  . Penicillins     REACTION: Unknown reaction    Current Outpatient Prescriptions  Medication Sig Dispense Refill  . acetaminophen (TYLENOL) 325 MG tablet Take 650 mg by mouth every 6 (six) hours as needed.        . ALPRAZolam (XANAX) 0.5 MG tablet Take 0.5 mg by mouth at bedtime as needed.        [name] aspirin 81 MG tablet Take 81 mg by mouth daily.        . cyclobenzaprine (FLEXERIL) 10 MG tablet Take 1 tablet (10 mg total) by mouth 3 (three) times daily as needed for muscle spasms. For Muscle spasms  90 tablet  0  . esomeprazole (NEXIUM) 40 MG capsule 30 mins before BF and 30 mins before evening meal  60 capsule  5  . HYDROcodone-acetaminophen (VICODIN ES) 7.5-750 MG per tablet Take 1 tablet by mouth every 6 (six) hours as needed.        [name] lisinopril (PRINIVIL,ZESTRIL) 20 MG tablet TAKE ONE TABLET BY MOUTH EVERY DAY **DISCONTINUE LISINOPRIL 5 MG AND HYDROCHLOROTHIAZIDE PER DOCTOR**  30 tablet  6  . lovastatin (MEVACOR) 20 MG tablet Take 1 tablet (20 mg total) by mouth at bedtime. To replace Lipitor  30 tablet  3  . metFORMIN (GLUCOPHAGE) 500 MG tablet Take 500 mg by mouth 2 (two) times daily with a meal.        . metoprolol (TOPROL-XL) 50 MG 24 hr tablet Take 1 tablet (50 mg total) by mouth daily.  45 tablet  6  . DISCONTD: metoprolol (TOPROL-XL) 50 MG 24 hr tablet TAKE ONE & ONE-HALF  TABLETS BY MOUTH EVERY DAY  45 tablet  3  . magnesium oxide (MAG-OX 400) 400 MG tablet Take 1 tablet (400 mg total) by mouth 2 (two) times daily.  60 tablet  10    Past Medical History  Diagnosis Date  . Chest pain     + palpitations; cath 2005- 30-40% mid LAD, 20% D1, 20% cx, OM, 20-30% RCA, and EF-55%  . COPD (chronic obstructive pulmonary disease)   . GERD (gastroesophageal reflux disease)   . Hyperlipemia   . Hypertension   . Obesity   . Depression   . Colitis 1990  . Tobacco abuse     1/2 pack per day  . Diabetes mellitus   . S/P endoscopy 2003; 2012    2003: Dr. Karilyn Cota: erosive esophagitis, ulcer at GE junction, negative H.pylori serology  2012: Dr. Darrick Penna, mild gastritis, Bravo placement, path negative H.pylori  . Fatty liver disease, nonalcoholic     Past Surgical History  Procedure Date  . Colonoscopy 1990  . Shoulder surgery     Right acromioclavicular joint arthrosis  . Neck mass excision   . Cardiac catheterization   . Bravo ph study 05/03/2011    Procedure: BRAVO PH STUDY;  Surgeon: Arlyce Harman,  MD;  Location: [facility];  Service: Endoscopy;;    ZOX:WRUEAV of systems complete and found to be negative unless listed above  PHYSICAL EXAM BP 113/73  Pulse 62  Resp 18  Ht 5\' 11"  (1.803 m)  Wt 212 lb (96.163 kg)  BMI 29.57 kg/m2  General: Well developed, well nourished, in no acute distress Head: Eyes PERRLA, No xanthomas.   Normal cephalic and atramatic  Lungs: Clear bilaterally to auscultation and percussion. Heart: HRRR S1 S2, without MRG.  Pulses are 2+ & equal.            No carotid bruit. No JVD.  No abdominal bruits. No femoral bruits. Abdomen: Bowel sounds are positive, abdomen soft and non-tender without masses or                  Hernia's noted. Msk:  Back normal, normal gait. Normal strength and tone for age. Extremities: No clubbing, cyanosis or edema.  DP +1 Neuro: Alert and oriented X 3. Psych:  Good affect, responds  appropriately    ASSESSMENT AND PLAN

## 2011-07-04 NOTE — Assessment & Plan Note (Signed)
Excellent control of BP. No medication changes.

## 2011-07-04 NOTE — Assessment & Plan Note (Signed)
Richard [name] continues to complain of chronic episodes of palpitations usually occuring at night.  He is taking his metoprolol but unfortunately continues to smoke and drink Mt. Dews. I have asked him to stop the caffeine by lunchtime and do his best to stop smoking. He uses an electric cigarette and will use this in the pm hours. I have given him Rx for magnesium oxide 400 mg BID, to assist with palpitations. He will follow-up with Dr. Dietrich Pates in 6 months.

## 2011-07-04 NOTE — Telephone Encounter (Signed)
Pt's wife dropped off the paperwork for his PA for Nexium. Requested samples of Nexium. #20 given.

## 2011-07-04 NOTE — Patient Instructions (Signed)
Your physician has recommended you make the following change in your medication: start taking Magnesium 400 mg twice daily  Your physician recommends that you schedule a follow-up appointment in: 6 months

## 2011-07-05 NOTE — Telephone Encounter (Signed)
Pt assistance forms on AS desk, waiting for written rx for nexium to send with paperwork.

## 2011-07-20 ENCOUNTER — Ambulatory Visit: Payer: Medicaid Other | Admitting: Gastroenterology

## 2011-08-23 ENCOUNTER — Ambulatory Visit: Payer: Self-pay | Admitting: Gastroenterology

## 2011-09-06 ENCOUNTER — Ambulatory Visit: Payer: Self-pay | Admitting: Gastroenterology

## 2011-09-22 ENCOUNTER — Telehealth: Payer: Self-pay | Admitting: Cardiology

## 2011-09-22 NOTE — Telephone Encounter (Signed)
Advised wife that Dr Juanetta Gosling, who is Mr. Krysiak PCP is qualified to address any pulmonary issues and would be able to make a referral to another provider if he felt it was necessary.  Wife will call for an appointment on Monday.

## 2011-09-22 NOTE — Telephone Encounter (Signed)
Patient wants to get referred to lung specialist. / tg

## 2011-10-11 ENCOUNTER — Other Ambulatory Visit: Payer: Self-pay | Admitting: Adult Health

## 2011-10-23 ENCOUNTER — Other Ambulatory Visit: Payer: Self-pay | Admitting: *Deleted

## 2011-10-23 MED ORDER — METOPROLOL SUCCINATE ER 50 MG PO TB24: 50.0000 mg | ORAL_TABLET | ORAL | Status: DC

## 2011-10-31 ENCOUNTER — Encounter: Payer: Self-pay | Admitting: Cardiology

## 2011-10-31 ENCOUNTER — Ambulatory Visit (INDEPENDENT_AMBULATORY_CARE_PROVIDER_SITE_OTHER): Payer: Self-pay | Admitting: Cardiology

## 2011-10-31 VITALS — BP 122/78 | HR 68 | Ht 71.0 in | Wt 209.0 lb

## 2011-10-31 DIAGNOSIS — I10 Essential (primary) hypertension: Secondary | ICD-10-CM

## 2011-10-31 DIAGNOSIS — E785 Hyperlipidemia, unspecified: Secondary | ICD-10-CM

## 2011-10-31 DIAGNOSIS — E782 Mixed hyperlipidemia: Secondary | ICD-10-CM

## 2011-10-31 DIAGNOSIS — M545 Low back pain: Secondary | ICD-10-CM

## 2011-10-31 DIAGNOSIS — J449 Chronic obstructive pulmonary disease, unspecified: Secondary | ICD-10-CM

## 2011-10-31 DIAGNOSIS — F17200 Nicotine dependence, unspecified, uncomplicated: Secondary | ICD-10-CM

## 2011-10-31 DIAGNOSIS — G8929 Other chronic pain: Secondary | ICD-10-CM

## 2011-10-31 DIAGNOSIS — E119 Type 2 diabetes mellitus without complications: Secondary | ICD-10-CM

## 2011-10-31 NOTE — Patient Instructions (Signed)
Your physician recommends that you schedule a follow-up appointment in: 9 months  STOP smoking  Your physician recommends that you return for lab work in: Today

## 2011-10-31 NOTE — Assessment & Plan Note (Signed)
Chronic obstructive pulmonary disease is mild and not clearly causing symptoms.  Patient strongly advised to completely discontinue cigarette smoking before his lung disease does become symptomatic.

## 2011-10-31 NOTE — Assessment & Plan Note (Signed)
The lipid-lowering medication has been changed due to cost considerations.  A repeat lipid profile will be obtained as well as a metabolic profile.

## 2011-10-31 NOTE — Assessment & Plan Note (Signed)
Back pain is the immediate problem most interfering with his daily activities, capacity to work and [employer] of life.  I will attempt to locate a specialist I will see him in the absence of healthcare coverage.

## 2011-10-31 NOTE — Assessment & Plan Note (Addendum)
BP control is excellent with current medications, which will be continued.  The beta blocker is adequately controlling palpitations without causing excessive bronchospasm.

## 2011-10-31 NOTE — Progress Notes (Deleted)
Name: Richard [name]    DOB: October 20, 1965  Age: 46 y.o.  MR#: [id number]       PCP:  Fredirick Maudlin, MD, MD      Insurance: @PAYORNAME @   CC:    Chief Complaint  Patient presents with  . c/o dizziness, fatigue, intermittent CP w/ reflux    - orthostasis / Med list reviewed    VS BP 122/78  Pulse 68  Weights Current Weight  07/04/11 212 lb (96.163 kg)  05/09/11 215 lb 12.8 oz (97.886 kg)  05/03/11 216 lb (97.977 kg)    Blood Pressure  BP Readings from Last 3 Encounters:  10/31/11 122/78  07/04/11 113/73  05/09/11 113/68     Admit date:  (Not on file) Last encounter with RMR:  09/22/2011   Allergy Allergies  Allergen Reactions  . Mushroom Ext Cmplx(Shiitake-Reishi-Mait) Anaphylaxis    Rapid heart rate.  . Penicillins     REACTION: Unknown reaction    Current Outpatient Prescriptions  Medication Sig Dispense Refill  . aspirin 81 MG tablet Take 81 mg by mouth daily.        . clindamycin (CLEOCIN) 150 MG capsule Take 150 mg by mouth 3 (three) times daily. Tooth abcess      . esomeprazole (NEXIUM) 40 MG capsule 30 mins before BF and 30 mins before evening meal  60 capsule  5  . HYDROcodone-acetaminophen (VICODIN ES) 7.5-750 MG per tablet Take 1 tablet by mouth every 6 (six) hours as needed.        [name] lisinopril (PRINIVIL,ZESTRIL) 20 MG tablet TAKE ONE TABLET BY MOUTH EVERY DAY **DISCONTINUE LISINOPRIL 5 MG AND HYDROCHLOROTHIAZIDE PER DOCTOR**  30 tablet  6  . lovastatin (MEVACOR) 20 MG tablet TAKE ONE TABLET BY MOUTH EVERY DAY AT BEDTIME **REPLACES LIPITOR**  30 tablet  6  . magnesium oxide (MAG-OX 400) 400 MG tablet Take 1 tablet (400 mg total) by mouth 2 (two) times daily.  60 tablet  10  . metFORMIN (GLUCOPHAGE) 500 MG tablet Take 500 mg by mouth 2 (two) times daily with a meal.        . metoprolol succinate (TOPROL-XL) 50 MG 24 hr tablet Take 1 tablet (50 mg total) by mouth as directed. 1 and 1/2 tab po daily  45 tablet  6  . acetaminophen (TYLENOL) 325 MG tablet Take 650  mg by mouth every 6 (six) hours as needed.        . ALPRAZolam (XANAX) 0.5 MG tablet Take 0.5 mg by mouth at bedtime as needed.        . cyclobenzaprine (FLEXERIL) 10 MG tablet Take 1 tablet (10 mg total) by mouth 3 (three) times daily as needed for muscle spasms. For Muscle spasms  90 tablet  0    Discontinued Meds:   There are no discontinued medications.  Patient Active Problem List  Diagnoses  . HYPERLIPIDEMIA  . OBESITY  . GASTROESOPHAGEAL REFLUX DISEASE  . FATTY LIVER DISEASE  . TOBACCO ABUSE  . CHRONIC OBSTRUCTIVE PULMONARY DISEASE  . DIABETES MELLITUS-TYPE II  . Hypertension  . Depression  . Otitis externa of both ears  . Palpitations  . Odynophagia    LABS No visits with results within 3 Month(s) from this visit. Latest known visit with results is:  Admission on 05/03/2011, Discharged on 05/03/2011  Component Date Value  . Glucose-Capillary 05/03/2011 157*  . Comment 1 05/03/2011 Documented in Chart      Results for this Opt Visit:  Results for orders placed in visit on 05/05/11  PULMONARY FUNCTION TEST      Component Value Range   FEV1       FVC       FEV1/FVC       TLC       DLCO        EKG Orders placed in visit on 04/21/11  . EKG 12-LEAD  . CARDIAC EVENT MONITOR     Prior Assessment and Plan Problem List as of 10/31/2011          Cardiology Problems   HYPERLIPIDEMIA   Last Assessment & Plan Note   03/22/2011 Office Visit Signed 03/22/2011  4:42 PM by Kathlen Brunswick, MD    Lipids were suboptimal when last assessed on current therapy.pravastatin will be changed to atorvastatin 20 mg per day with a repeat lipid profile in 2 months.    Hypertension   Last Assessment & Plan Note   07/04/2011 Office Visit Signed 07/04/2011  3:21 PM by Jodelle Gross, NP    Excellent control of BP. No medication changes.      Other   Palpitations   Last Assessment & Plan Note   07/04/2011 Office Visit Signed 07/04/2011  3:21 PM by Jodelle Gross,  NP    Mr.Krummel continues to complain of chronic episodes of palpitations usually occuring at night.  He is taking his metoprolol but unfortunately continues to smoke and drink Mt. Dews. I have asked him to stop the caffeine by lunchtime and do his best to stop smoking. He uses an electric cigarette and will use this in the pm hours. I have given him Rx for magnesium oxide 400 mg BID, to assist with palpitations. He will follow-up with Dr. Dietrich Pates in 6 months.     OBESITY   GASTROESOPHAGEAL REFLUX DISEASE   Last Assessment & Plan Note   05/01/2011 Office Visit Signed 05/01/2011 11:25 AM by Nira Retort, NP    45 year old with new onset dyspepsia in the setting of chronic GERD, last EGD in 2003 with Dr. 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 On Nexium BID, continued breakthrough symptoms despite dietary modification, weight loss. Denies NSAIDs or aspirin powders. Sees cardiology for palpitations, "muscle spasms". Negative troponin and EKG on file. Awaiting cardiac monitor for evaluation of possible palpitations. Will need to proceed with EGD with Dr. Darrick Penna in near future.  Proceed with upper endoscopy in the near future with Dr. Darrick Penna. This will need to be done in the OR with assistance of Propofol due to polypharmacy. The risks, benefits, and alternatives have been discussed in detail with patient. He has stated understanding and desires to proceed.  Continue Nexium BID Continue f/u with cardiac    FATTY LIVER DISEASE   Last Assessment & Plan Note   03/22/2011 Office Visit Signed 03/22/2011  4:40 PM by Kathlen Brunswick, MD    Hepatic disorder is managed by Dr. Lionel December.  The importance of weight loss and restriction of concentrated sweets from his diet was emphasized.    TOBACCO ABUSE   Last Assessment & Plan Note   07/04/2011 Office Visit Signed 07/04/2011  3:22 PM by Jodelle Gross, NP    Cessation is discussed.    CHRONIC OBSTRUCTIVE PULMONARY DISEASE   Last  Assessment & Plan Note   04/21/2011 Office Visit Signed 04/21/2011  3:28 PM by Jodelle Gross, NP    PFT's will be completed to evaluate further. He will follow with  Dr. Juanetta Gosling concerning this.  I have advised smoking cessation immediately. He verbalizes understanding, stating that smoking is making him nauseated.    DIABETES MELLITUS-TYPE II   Last Assessment & Plan Note   03/22/2011 Office Visit Addendum 03/23/2011 10:04 PM by Kathlen Brunswick, MD    Patient has not officially been diagnosed with diabetes, but his serum glucose levels have been elevated when assessed with his wife's meter, sometimes exceeding 300.  A hemoglobin A1c level will be obtained and patient referred to a PCP.    Depression   Otitis externa of both ears   Last Assessment & Plan Note   12/27/2010 Office Visit Signed 12/27/2010  4:48 PM by Jodelle Gross, NP    Will begin cortosporin ear drops TID for one week. If not better he will follow with his primary care physician.    Odynophagia   Last Assessment & Plan Note   05/09/2011 Office Visit Signed 05/09/2011 10:20 AM by Nira Retort, NP    46 year old male s/p EGD with Bravo placement on 10/17, findings of mild gastritis, negative H.pylori. Reports "tearing" pain right side of neck with swallowing, also reports chest discomfort to bilateral areas of mid-sternum. Sensation of pills "hanging" up in cervical esophagus. No tenderness to palpation, no fever/chills, or tachycardia. No nausea or vomiting. Decreased po intake due to pain. Using chloraseptic lozenges as needed. Slight improvement in symptoms overall, but still continues to complain of significant discomfort. Beefy red oropharynx, concern for other etiology to be causing symptoms other than GI origin. However, as he is almost 1 week out from EGD and continuing to complain of dysphagia/odynophagia, significant pain, we will proceed with UGI with gastrografin. If negative, barium swallow to be performed. Doubt  dealing with acute process here but would like to assess due to symptomatology. Further recommendations after study.         Imaging: No results found.   FRS Calculation: Score not calculated. Missing: Total Cholesterol

## 2011-10-31 NOTE — Assessment & Plan Note (Signed)
Tobacco cessation advised.  Patient prefers to continue to taper consumption.

## 2011-10-31 NOTE — Progress Notes (Signed)
Patient ID: Richard [name], male   DOB: Sep 18, 1965, 46 y.o.   MRN: [id number]  HPI: Scheduled return visit for this gentleman with mild coronary artery disease at catheterization in 2005 and multiple cardiovascular risk factors.  Since his last visit, he developed frank diabetes, but this has been well controlled with oral medication.  He lost Medicaid coverage and this no longer is able to see Dr. Juanetta Gosling.  Current care is provided by [facility].  Medications are provided by the pharmaceutical companies.  He has had some palpitations, but these are not debilitating.  His principal health concern has been chronic low back pain that is now preventing him from working.  He is considering an application for disability.  He also notes intermittent atypical chest discomfort, malaise and fatigue as well as dyspnea on exertion in warm weather.  Prior to Admission medications   Medication Sig Start Date End Date Taking? Authorizing Provider  aspirin 81 MG tablet Take 81 mg by mouth daily.     Yes Historical Provider, MD  clindamycin (CLEOCIN) 150 MG capsule Take 150 mg by mouth 3 (three) times daily. Tooth abcess   Yes Historical Provider, MD  esomeprazole (NEXIUM) 40 MG capsule 30 mins before BF and 30 mins before evening meal 12/09/10  Yes Tiffany Kocher, PA  HYDROcodone-acetaminophen (VICODIN ES) 7.5-750 MG per tablet Take 1 tablet by mouth every 6 (six) hours as needed.     Yes Historical Provider, MD  lisinopril (PRINIVIL,ZESTRIL) 20 MG tablet TAKE ONE TABLET BY MOUTH EVERY DAY **DISCONTINUE LISINOPRIL 5 MG AND HYDROCHLOROTHIAZIDE PER DOCTOR** 04/13/11  Yes Kathlen Brunswick, MD  lovastatin (MEVACOR) 20 MG tablet TAKE ONE TABLET BY MOUTH EVERY DAY AT BEDTIME **REPLACES LIPITOR** 10/11/11  Yes Jodelle Gross, NP  magnesium oxide (MAG-OX 400) 400 MG tablet Take 1 tablet (400 mg total) by mouth 2 (two) times daily. 07/04/11 07/03/12 Yes Jodelle Gross, NP  metFORMIN (GLUCOPHAGE) 500 MG tablet  Take 500 mg by mouth 2 (two) times daily with a meal.     Yes Historical Provider, MD  metoprolol succinate (TOPROL-XL) 50 MG 24 hr tablet Take 1 tablet (50 mg total) by mouth as directed. 1 and 1/2 tab po daily 10/23/11  Yes Jodelle Gross, NP  nitroGLYCERIN (NITROSTAT) 0.4 MG SL tablet Place 0.4 mg under the tongue every 5 (five) minutes as needed.   Yes Historical Provider, MD  acetaminophen (TYLENOL) 325 MG tablet Take 650 mg by mouth every 6 (six) hours as needed.      Historical Provider, MD  ALPRAZolam Prudy Feeler) 0.5 MG tablet Take 0.5 mg by mouth at bedtime as needed.      Historical Provider, MD  cyclobenzaprine (FLEXERIL) 10 MG tablet Take 1 tablet (10 mg total) by mouth 3 (three) times daily as needed for muscle spasms. For Muscle spasms 04/21/11 04/20/12  Jodelle Gross, NP   Allergies  Allergen Reactions  . Mushroom Ext Cmplx(Shiitake-Reishi-Mait) Anaphylaxis    Rapid heart rate.  . Penicillins     REACTION: Unknown reaction   Past medical history, social history, and family history reviewed and updated.  ROS: Minimal dysphasia, no gastroesophageal reflux disease symptoms, no orthopnea nor PND, no lightheadedness or syncope.  All other systems reviewed and are negative.  PHYSICAL EXAM: BP 122/78  Pulse 68  Ht 5\' 11"  (1.803 m)  Wt 94.802 kg (209 lb)  BMI 29.15 kg/m2  SpO2 98%  General-Well developed; no acute distress Body habitus-Mildly overweight Neck-No  JVD; no carotid bruits Lungs-clear lung fields; resonant to percussion; mildly prolonged expiratory phase Cardiovascular-normal PMI; Distant S1 and S2 Abdomen-normal bowel sounds; soft and non-tender without masses or organomegaly Musculoskeletal-No deformities, no cyanosis or clubbing Neurologic-Normal cranial nerves; symmetric strength and tone Skin-Warm, no significant lesions Extremities-1-2+ distal pulses; no edema  ASSESSMENT AND PLAN:  Macon Bing, MD 10/31/2011 12:33 PM

## 2011-10-31 NOTE — Progress Notes (Deleted)
Name: Richard [name]    DOB: 1966/06/21  Age: 46 y.o.  MR#: [id number]       PCP:  Richard Maudlin, MD, MD      Insurance: @PAYORNAME @   CC:    Chief Complaint  Patient presents with  . c/o dizziness, fatigue, intermittent CP w/ reflux    - orthostasis / Med list reviewed    VS BP 122/78  Pulse 68  Ht 5\' 11"  (1.803 m)  Wt 209 lb (94.802 kg)  BMI 29.15 kg/m2  SpO2 98%  Weights Current Weight  10/31/11 209 lb (94.802 kg)  07/04/11 212 lb (96.163 kg)  05/09/11 215 lb 12.8 oz (97.886 kg)    Blood Pressure  BP Readings from Last 3 Encounters:  10/31/11 122/78  07/04/11 113/73  05/09/11 113/68     Admit date:  (Not on file) Last encounter with RMR:  09/22/2011   Allergy Allergies  Allergen Reactions  . Mushroom Ext Cmplx(Shiitake-Reishi-Mait) Anaphylaxis    Rapid heart rate.  . Penicillins     REACTION: Unknown reaction    Current Outpatient Prescriptions  Medication Sig Dispense Refill  . aspirin 81 MG tablet Take 81 mg by mouth daily.        . clindamycin (CLEOCIN) 150 MG capsule Take 150 mg by mouth 3 (three) times daily. Tooth abcess      . esomeprazole (NEXIUM) 40 MG capsule 30 mins before BF and 30 mins before evening meal  60 capsule  5  . HYDROcodone-acetaminophen (VICODIN ES) 7.5-750 MG per tablet Take 1 tablet by mouth every 6 (six) hours as needed.        [name] lisinopril (PRINIVIL,ZESTRIL) 20 MG tablet TAKE ONE TABLET BY MOUTH EVERY DAY **DISCONTINUE LISINOPRIL 5 MG AND HYDROCHLOROTHIAZIDE PER DOCTOR**  30 tablet  6  . lovastatin (MEVACOR) 20 MG tablet TAKE ONE TABLET BY MOUTH EVERY DAY AT BEDTIME **REPLACES LIPITOR**  30 tablet  6  . magnesium oxide (MAG-OX 400) 400 MG tablet Take 1 tablet (400 mg total) by mouth 2 (two) times daily.  60 tablet  10  . metFORMIN (GLUCOPHAGE) 500 MG tablet Take 500 mg by mouth 2 (two) times daily with a meal.        . metoprolol succinate (TOPROL-XL) 50 MG 24 hr tablet Take 1 tablet (50 mg total) by mouth as directed. 1 and 1/2 tab  po daily  45 tablet  6  . acetaminophen (TYLENOL) 325 MG tablet Take 650 mg by mouth every 6 (six) hours as needed.        . ALPRAZolam (XANAX) 0.5 MG tablet Take 0.5 mg by mouth at bedtime as needed.        . cyclobenzaprine (FLEXERIL) 10 MG tablet Take 1 tablet (10 mg total) by mouth 3 (three) times daily as needed for muscle spasms. For Muscle spasms  90 tablet  0    Discontinued Meds:   There are no discontinued medications.  Patient Active Problem List  Diagnoses  . HYPERLIPIDEMIA  . OBESITY  . GASTROESOPHAGEAL REFLUX DISEASE  . FATTY LIVER DISEASE  . TOBACCO ABUSE  . CHRONIC OBSTRUCTIVE PULMONARY DISEASE  . DIABETES MELLITUS-TYPE II  . Hypertension  . Depression  . Otitis externa of both ears  . Palpitations  . Odynophagia    LABS No visits with results within 3 Month(s) from this visit. Latest known visit with results is:  Admission on 05/03/2011, Discharged on 05/03/2011  Component Date Value  . Glucose-Capillary 05/03/2011 157*  .  Comment 1 05/03/2011 Documented in Chart      Results for this Opt Visit:     Results for orders placed in visit on 05/05/11  PULMONARY FUNCTION TEST      Component Value Range   FEV1       FVC       FEV1/FVC       TLC       DLCO        EKG Orders placed in visit on 04/21/11  . EKG 12-LEAD  . CARDIAC EVENT MONITOR     Prior Assessment and Plan Problem List as of 10/31/2011          Cardiology Problems   HYPERLIPIDEMIA   Last Assessment & Plan Note   03/22/2011 Office Visit Signed 03/22/2011  4:42 PM by Richard Brunswick, MD    Lipids were suboptimal when last assessed on current therapy.pravastatin will be changed to atorvastatin 20 mg per day with a repeat lipid profile in 2 months.    Hypertension   Last Assessment & Plan Note   07/04/2011 Office Visit Signed 07/04/2011  3:21 PM by Richard Gross, NP    Excellent control of BP. No medication changes.      Other   Palpitations   Last Assessment & Plan Note    07/04/2011 Office Visit Signed 07/04/2011  3:21 PM by Richard Gross, NP    Richard [name] continues to complain of chronic episodes of palpitations usually occuring at night.  He is taking his metoprolol but unfortunately continues to smoke and drink Mt. Dews. I have asked him to stop the caffeine by lunchtime and do his best to stop smoking. He uses an electric cigarette and will use this in the pm hours. I have given him Rx for magnesium oxide 400 mg BID, to assist with palpitations. He will follow-up with Richard [name] in 6 months.     OBESITY   GASTROESOPHAGEAL REFLUX DISEASE   Last Assessment & Plan Note   05/01/2011 Office Visit Signed 05/01/2011 11:25 AM by Richard Retort, NP    46 year old with new onset dyspepsia in the setting of chronic GERD, last EGD in 2003 with Richard [name] with erosive esophagitis, ulcer at GE junction. On Nexium BID, continued breakthrough symptoms despite dietary modification, weight loss. Denies NSAIDs or aspirin powders. Sees cardiology for palpitations, "muscle spasms". Negative troponin and EKG on file. Awaiting cardiac monitor for evaluation of possible palpitations. Will need to proceed with EGD with Richard [name] in near future.  Proceed with upper endoscopy in the near future with Richard [name]. This will need to be done in the OR with assistance of Propofol due to polypharmacy. The risks, benefits, and alternatives have been discussed in detail with patient. He has stated understanding and desires to proceed.  Continue Nexium BID Continue f/u with cardiac    FATTY LIVER DISEASE   Last Assessment & Plan Note   03/22/2011 Office Visit Signed 03/22/2011  4:40 PM by Richard Brunswick, MD    Hepatic disorder is managed by Richard [name].  The importance of weight loss and restriction of concentrated sweets from his diet was emphasized.    TOBACCO ABUSE   Last Assessment & Plan Note   07/04/2011 Office Visit Signed 07/04/2011  3:22 PM by Richard Gross, NP     Cessation is discussed.    CHRONIC OBSTRUCTIVE PULMONARY DISEASE   Last Assessment & Plan Note   04/21/2011 Office Visit Signed 04/21/2011  3:28  PM by Richard Gross, NP    PFT's will be completed to evaluate further. He will follow with Dr. Juanetta Gosling concerning this.  I have advised smoking cessation immediately. He verbalizes understanding, stating that smoking is making him nauseated.    DIABETES MELLITUS-TYPE II   Last Assessment & Plan Note   03/22/2011 Office Visit Addendum 03/23/2011 10:04 PM by Richard Brunswick, MD    Patient has not officially been diagnosed with diabetes, but his serum glucose levels have been elevated when assessed with his wife's meter, sometimes exceeding 300.  A hemoglobin A1c level will be obtained and patient referred to a PCP.    Depression   Otitis externa of both ears   Last Assessment & Plan Note   12/27/2010 Office Visit Signed 12/27/2010  4:48 PM by Richard Gross, NP    Will begin cortosporin ear drops TID for one week. If not better he will follow with his primary care physician.    Odynophagia   Last Assessment & Plan Note   05/09/2011 Office Visit Signed 05/09/2011 10:20 AM by Richard Retort, NP    46 year old male s/p EGD with Bravo placement on 10/17, findings of mild gastritis, negative H.pylori. Reports "tearing" pain right side of neck with swallowing, also reports chest discomfort to bilateral areas of mid-sternum. Sensation of pills "hanging" up in cervical esophagus. No tenderness to palpation, no fever/chills, or tachycardia. No nausea or vomiting. Decreased po intake due to pain. Using chloraseptic lozenges as needed. Slight improvement in symptoms overall, but still continues to complain of significant discomfort. Beefy red oropharynx, concern for other etiology to be causing symptoms other than GI origin. However, as he is almost 1 week out from EGD and continuing to complain of dysphagia/odynophagia, significant pain, we will proceed with UGI  with gastrografin. If negative, barium swallow to be performed. Doubt dealing with acute process here but would like to assess due to symptomatology. Further recommendations after study.         Imaging: No results found.   FRS Calculation: Score not calculated. Missing: Total Cholesterol

## 2011-10-31 NOTE — Assessment & Plan Note (Signed)
A1c reportedly excellent a few months ago.  Patient advised to discontinue intake of sweet beverages, continue to lose weight and to monitor his CBGs occasionally

## 2011-11-02 ENCOUNTER — Other Ambulatory Visit: Payer: Self-pay | Admitting: *Deleted

## 2011-11-02 ENCOUNTER — Ambulatory Visit: Payer: Self-pay | Admitting: Gastroenterology

## 2011-11-02 MED ORDER — METOPROLOL SUCCINATE ER 50 MG PO TB24: ORAL_TABLET | ORAL | Status: DC

## 2011-11-05 ENCOUNTER — Encounter: Payer: Self-pay | Admitting: Cardiology

## 2011-11-11 ENCOUNTER — Other Ambulatory Visit: Payer: Self-pay | Admitting: Cardiology

## 2011-11-29 ENCOUNTER — Encounter: Payer: Self-pay | Admitting: Gastroenterology

## 2011-11-29 ENCOUNTER — Ambulatory Visit (INDEPENDENT_AMBULATORY_CARE_PROVIDER_SITE_OTHER): Payer: Self-pay | Admitting: Gastroenterology

## 2011-11-29 VITALS — BP 113/70 | HR 65 | Temp 98.1°F | Ht 71.0 in | Wt 210.0 lb

## 2011-11-29 DIAGNOSIS — R1013 Epigastric pain: Secondary | ICD-10-CM | POA: Insufficient documentation

## 2011-11-29 DIAGNOSIS — K3189 Other diseases of stomach and duodenum: Secondary | ICD-10-CM

## 2011-11-29 MED ORDER — ESOMEPRAZOLE MAGNESIUM 40 MG PO CPDR: DELAYED_RELEASE_CAPSULE | ORAL | Status: DC

## 2011-11-29 NOTE — Assessment & Plan Note (Signed)
RESOLVED . SX EXACERBATED BY ANXIETY. DID NOT START IMIPRAMINE. NOW TAKING PPI REGULARLY DUE TO GETTING PT ASSISTANCE.   Continue Nexium. Take 30 minutes prior to meals and at bedtime. MAIL PRESCRIPTION TO PHARMACY.  FOLLOW GERD LIFE STYLE RECOMMENDATIONS.   FOLLOW A LOW FAT DIET.   FOLLOW UP WITH MENTAL HEALTH.  FOLLOW UP IN 6 MOS.

## 2011-11-29 NOTE — Patient Instructions (Addendum)
Continue Nexium. Take 30 minutes prior to meals and at bedtime. MAIL PRESCRIPTION TO PHARMACY.  FOLLOW LIFE STYLE RECOMMENDATIONS. SEE INFO BELOW.  FOLLOW A LOW FAT DIET. SEE INFO BELOW.  FOLLOW UP WITH MENTAL HEALTH.  FOLLOW UP IN 6 MOS.  Reflux REMEDIES  RISK FACTORS FOR REFLUX   Obesity: your body mass index is 29 consistent with BEING OVERWEIGHT   CARBONATED BEVERAGES   CAFFEINE   Lifestyle and home remedies You may eliminate or reduce the frequency of heartburn by making the following lifestyle changes:    Control your weight. Being overweight is a major risk factor for heartburn and GERD. Excess pounds put pressure on your abdomen, pushing up your stomach and causing acid to back up into your esophagus.    Eat 4-6 small meals daily. This reduces pressure on the lower esophageal sphincter, helping to prevent the valve from opening and acid from washing back into your esophagus.    Loosen your belt. Clothes that fit tightly around your waist put pressure on your abdomen and the lower esophageal sphincter.      Eliminate heartburn triggers. Everyone has specific triggers. Common triggers such as fatty or fried foods, spicy food, tomato sauce, carbonated beverages, alcohol, chocolate, mint, garlic, onion, caffeine and nicotine may make heartburn worse.    Avoid stooping or bending. Tying your shoes is OK. Bending over for longer periods to weed your garden isn't, especially soon after eating.    Don't lie down after a meal. Wait at least three to four hours after eating before going to bed, and don't lie down right after eating.     Alternative medicine   Several home remedies exist for treating GERD, but they provide only temporary relief. They include drinking baking soda (sodium bicarbonate) added to water or drinking other fluids such as baking soda mixed with cream of tartar and water.   Although these liquids create temporary relief by neutralizing, washing away or buffering  acids, eventually they aggravate the situation by adding gas and fluid to your stomach, increasing pressure and causing more acid reflux. Further, adding more sodium to your diet may increase your blood pressure and add stress to your heart, and excessive bicarbonate ingestion can alter the acid-base balance in your body.   Low-Fat Diet BREADS, CEREALS, PASTA, RICE, DRIED PEAS, AND BEANS These products are high in carbohydrates and most are low in fat. Therefore, they can be increased in the diet as substitutes for fatty foods. They too, however, contain calories and should not be eaten in excess. Cereals can be eaten for snacks as well as for breakfast.   FRUITS AND VEGETABLES It is good to eat fruits and vegetables. Besides being sources of fiber, both are rich in vitamins and some minerals. They help you get the daily allowances of these nutrients. Fruits and vegetables can be used for snacks and desserts.  MEATS Limit lean meat, chicken, [place], and fish to no more than 6 ounces per day. Beef, Pork, and Lamb Use lean cuts of beef, pork, and lamb. Lean cuts include:  Extra-lean ground beef.  Arm roast.  Sirloin tip.  Center-cut ham.  Round steak.  Loin chops.  Rump roast.  Tenderloin.  Trim all fat off the outside of meats before cooking. It is not necessary to severely decrease the intake of red meat, but lean choices should be made. Lean meat is rich in protein and contains a highly absorbable form of iron. Premenopausal women, in particular, should avoid  reducing lean red meat because this could increase the risk for low red blood cells (iron-deficiency anemia).  Chicken and [place] These are good sources of protein. The fat of poultry can be reduced by removing the skin and underlying fat layers before cooking. Chicken and [place] can be substituted for lean red meat in the diet. Poultry should not be fried or covered with high-fat sauces. Fish and Shellfish Fish is a good source of  protein. Shellfish contain cholesterol, but they usually are low in saturated fatty acids. The preparation of fish is important. Like chicken and [place], they should not be fried or covered with high-fat sauces. EGGS Egg whites contain no fat or cholesterol. They can be eaten often. Try 1 to 2 egg whites instead of whole eggs in recipes or use egg substitutes that do not contain yolk. MILK AND DAIRY PRODUCTS Use skim or 1% milk instead of 2% or whole milk. Decrease whole milk, natural, and processed cheeses. Use nonfat or low-fat (2%) cottage cheese or low-fat cheeses made from vegetable oils. Choose nonfat or low-fat (1 to 2%) yogurt. Experiment with evaporated skim milk in recipes that call for heavy cream. Substitute low-fat yogurt or low-fat cottage cheese for sour cream in dips and salad dressings. Have at least 2 servings of low-fat dairy products, such as 2 glasses of skim (or 1%) milk each day to help get your daily calcium intake. FATS AND OILS Reduce the total intake of fats, especially saturated fat. Butterfat, lard, and beef fats are high in saturated fat and cholesterol. These should be avoided as much as possible. Vegetable fats do not contain cholesterol, but certain vegetable fats, such as coconut oil, palm oil, and palm kernel oil are very high in saturated fats. These should be limited. These fats are often used in bakery goods, processed foods, popcorn, oils, and nondairy creamers. Vegetable shortenings and some peanut butters contain hydrogenated oils, which are also saturated fats. Read the labels on these foods and check for saturated vegetable oils. Unsaturated vegetable oils and fats do not raise blood cholesterol. However, they should be limited because they are fats and are high in calories. Total fat should still be limited to 30% of your daily caloric intake. Desirable liquid vegetable oils are corn oil, cottonseed oil, olive oil, canola oil, safflower oil, soybean oil, and  sunflower oil. Peanut oil is not as good, but small amounts are acceptable. Buy a heart-healthy tub margarine that has no partially hydrogenated oils in the ingredients. Mayonnaise and salad dressings often are made from unsaturated fats, but they should also be limited because of their high calorie and fat content. Seeds, nuts, peanut butter, olives, and avocados are high in fat, but the fat is mainly the unsaturated type. These foods should be limited mainly to avoid excess calories and fat. OTHER EATING TIPS Snacks  Most sweets should be limited as snacks. They tend to be rich in calories and fats, and their caloric content outweighs their nutritional value. Some good choices in snacks are graham crackers, melba toast, soda crackers, bagels (no egg), English muffins, fruits, and vegetables. These snacks are preferable to snack crackers, [place] fries, TORTILLA CHIPS, and POTATO chips. Popcorn should be air-popped or cooked in small amounts of liquid vegetable oil. Desserts Eat fruit, low-fat yogurt, and fruit ices instead of pastries, cake, and cookies. Sherbet, angel food cake, gelatin dessert, frozen low-fat yogurt, or other frozen products that do not contain saturated fat (pure fruit juice bars, frozen ice pops) are  also acceptable.  COOKING METHODS Choose those methods that use little or no fat. They include: Poaching.  Braising.  Steaming.  Grilling.  Baking.  Stir-frying.  Broiling.  Microwaving.  Foods can be cooked in a nonstick pan without added fat, or use a nonfat cooking spray in regular cookware. Limit fried foods and avoid frying in saturated fat. Add moisture to lean meats by using water, broth, cooking wines, and other nonfat or low-fat sauces along with the cooking methods mentioned above. Soups and stews should be chilled after cooking. The fat that forms on top after a few hours in the refrigerator should be skimmed off. When preparing meals, avoid using excess salt. Salt can  contribute to raising blood pressure in some people.  EATING AWAY FROM HOME Order entres, potatoes, and vegetables without sauces or butter. When meat exceeds the size of a deck of cards (3 to 4 ounces), the rest can be taken home for another meal. Choose vegetable or fruit salads and ask for low-calorie salad dressings to be served on the side. Use dressings sparingly. Limit high-fat toppings, such as bacon, crumbled eggs, cheese, sunflower seeds, and olives. Ask for heart-healthy tub margarine instead of butter.

## 2011-11-29 NOTE — Progress Notes (Signed)
Subjective:    Patient ID: Richard [name], male    DOB: 28-Sep-1965, 46 y.o.   MRN: [id number]  PCP: [facility]  HPI Fluttering in chest gone. NEVER TOOK IMIPRAMINE.  TAKING NEXIUM BID. KIDS ARE NOW OUT OF HIS 3 BEDROOM MOBILE HOME.  Past Medical History  Diagnosis Date  . Chest pain     + palpitations; cath 2005- 30-40% mid LAD, 20% D1, 20% cx, OM, 20-30% RCA, and EF-55%  . COPD (chronic obstructive pulmonary disease)   . GERD (gastroesophageal reflux disease)   . Hyperlipemia   . Hypertension   . Depression   . Colitis 1990  . Tobacco abuse     1/2 pack per day  . Diabetes mellitus   . Gastric ulcer 2003; 2012    2003: + esophagitis; negative H.pylori serology  2012: Dr. Darrick Penna, mild gastritis, Bravo PH probe placement, negative H.pylori  . Hepatic steatosis   . Chronic low back pain     Past Surgical History  Procedure Date  . Colonoscopy 1990  . Shoulder surgery     Right acromioclavicular joint arthrosis  . Neck mass excision   . Cardiac catheterization 2005  . Bravo ph study 05/03/2011    Procedure: BRAVO PH STUDY;  Surgeon: Arlyce Harman, MD;  Location: [facility];  Service: Endoscopy;;   Allergies  Allergen Reactions  . Mushroom Ext Cmplx(Shiitake-Reishi-Mait) Anaphylaxis    Rapid heart rate.  . Penicillins     REACTION: Unknown reaction   Current Outpatient Prescriptions  Medication Sig Dispense Refill  . acetaminophen (TYLENOL) 325 MG tablet Take 650 mg by mouth every 6 (six) hours as needed.        . ALPRAZolam (XANAX) 0.5 MG tablet Take 0.5 mg by mouth at bedtime as needed.        [name] aspirin 81 MG tablet Take 81 mg by mouth daily.      . cyclobenzaprine (FLEXERIL) 10 MG tablet Take 1 tablet (10 mg total) by mouth 3 (three) times daily as needed for muscle spasms. For Muscle spasms    . esomeprazole (NEXIUM) 40 MG capsule 30 mins before BF and 30 mins before evening meal    . HYDROcodone-acetaminophen (VICODIN ES) 7.5-750 MG per tablet Take 1 tablet by mouth  every 6 (six) hours as needed.      [name] lisinopril (PRINIVIL,ZESTRIL) 20 MG tablet TAKE ONE TABLET BY MOUTH EVERY DAYDISCONTINUE LISINOPRIL 5MG  AND HYDROCHLOROTHIAZIDE PER MD    . lovastatin (MEVACOR) 20 MG tablet TAKE ONE TABLET BY MOUTH EVERY DAY AT BEDTIME REPLACES LIPITOR    . metFORMIN (GLUCOPHAGE) 500 MG tablet Take 500 mg by mouth 2 (two) times daily with a meal.      . metoprolol succinate (TOPROL-XL) 50 MG 24 hr tablet 1 and 1/2 tab po daily    . nitroGLYCERIN (NITROSTAT) 0.4 MG SL tablet Place 0.4 mg under the tongue every 5 (five) minutes as needed.    . clindamycin (CLEOCIN) 150 MG capsule Take 150 mg by mouth 3 (three) times daily. Tooth abcess    . magnesium oxide (MAG-OX 400) 400 MG tablet Take 1 tablet (400 mg total) by mouth 2 (two) times daily.        Review of Systems     Objective:   Physical Exam  Vitals reviewed. Constitutional: He is oriented to person, place, and time. No distress.  HENT:  Head: Normocephalic and atraumatic.  Mouth/Throat: Oropharynx is clear and moist. No oropharyngeal exudate.  Cardiovascular: Normal rate,  regular rhythm and normal heart sounds.   Pulmonary/Chest: Effort normal and breath sounds normal. No respiratory distress.  Abdominal: Soft. Bowel sounds are normal. He exhibits no distension. There is no tenderness.  Neurological: He is alert and oriented to person, place, and time.       NO FOCAL DEFICITS   Psychiatric:       ANXIOUS MOOD. NL AFFECT.          Assessment & Plan:

## 2011-12-05 NOTE — Progress Notes (Signed)
Faxed to PCP

## 2011-12-07 ENCOUNTER — Telehealth: Payer: Self-pay | Admitting: Cardiology

## 2011-12-07 NOTE — Telephone Encounter (Signed)
Spoke with wife, who is under the understanding that Dr Dietrich Pates had agreed to manage patients pulmonary issues.  Per previous office note and telephone conversations, with wife since, they have been advised to contact Dr Juanetta Gosling for management, however state that he requires too much up front payment prior to seeing patient.  It was noted in last OV, that PFTs would be beneficial, however patient has not had them done.  Would it be ok to refer him to a [facility] Pollack pulmonologist for management.  Please advise.

## 2011-12-07 NOTE — Telephone Encounter (Signed)
PER CALL FROM PT WIFE AT LEST VISIT WITH DR Dietrich Pates, HE STATED THAT HE CAN MANAGE HIS COPD.  SHE WOULD LIKE FOR [name] TO CALL IN A RX FOR A INHALER.

## 2011-12-09 NOTE — Telephone Encounter (Signed)
PFTs performed late last year suggest mild lung disease.  We can prescribe Combivent 2 puffs 3 times a day for him, but he will not be able to afford to purchase.  If [facility] can supply his inhalers, that would be preferable.  Otherwise, assistance through an indigent drug program should be sought.  He does not require referral to a pulmonologist.

## 2011-12-12 ENCOUNTER — Other Ambulatory Visit: Payer: Self-pay | Admitting: *Deleted

## 2011-12-12 MED ORDER — IPRATROPIUM-ALBUTEROL 18-103 MCG/ACT IN AERO: 2.0000 | INHALATION_SPRAY | Freq: Three times a day (TID) | RESPIRATORY_TRACT | Status: DC

## 2011-12-12 NOTE — Telephone Encounter (Signed)
Will supply patient with prescription for Combivent, as recommended.  Wife aware and will call her as soon as it is signed by Dr Dietrich Pates on Thursday.

## 2011-12-18 ENCOUNTER — Telehealth: Payer: Self-pay | Admitting: Cardiology

## 2011-12-18 ENCOUNTER — Other Ambulatory Visit: Payer: Self-pay | Admitting: *Deleted

## 2011-12-18 MED ORDER — IPRATROPIUM-ALBUTEROL 20-100 MCG/ACT IN AERS: 1.0000 | INHALATION_SPRAY | Freq: Four times a day (QID) | RESPIRATORY_TRACT | Status: DC

## 2011-12-18 NOTE — Telephone Encounter (Signed)
Patient has a prescription drug card for respimat combivent inhaler.  Ok per Dr Dietrich Pates to provide script.

## 2011-12-18 NOTE — Telephone Encounter (Signed)
Patient has questions about inhaler that was called in. / tg

## 2011-12-18 NOTE — Telephone Encounter (Signed)
Spoke to wife, who states that the Combivent, prescribed by Dr Dietrich Pates cannot be provided by them.  Will call [facility] prescription assistance and find out what they do supply and go from there.

## 2011-12-25 ENCOUNTER — Other Ambulatory Visit: Payer: Self-pay | Admitting: Gastroenterology

## 2011-12-25 MED ORDER — ESOMEPRAZOLE MAGNESIUM 40 MG PO CPDR: DELAYED_RELEASE_CAPSULE | ORAL | Status: DC

## 2012-01-01 ENCOUNTER — Ambulatory Visit: Payer: Self-pay | Admitting: Cardiology

## 2012-01-08 ENCOUNTER — Emergency Department (HOSPITAL_COMMUNITY): Payer: Self-pay

## 2012-01-08 ENCOUNTER — Emergency Department (HOSPITAL_COMMUNITY)
Admission: EM | Admit: 2012-01-08 | Discharge: 2012-01-08 | Disposition: A | Discharge status: Home / Self Care | Payer: Self-pay | Attending: Emergency Medicine | Admitting: Emergency Medicine

## 2012-01-08 ENCOUNTER — Encounter (HOSPITAL_COMMUNITY): Payer: Self-pay | Admitting: *Deleted

## 2012-01-08 DIAGNOSIS — J4489 Other specified chronic obstructive pulmonary disease: Secondary | ICD-10-CM | POA: Insufficient documentation

## 2012-01-08 DIAGNOSIS — T675XXA Heat exhaustion, unspecified, initial encounter: Secondary | ICD-10-CM | POA: Insufficient documentation

## 2012-01-08 DIAGNOSIS — Y93H3 Activity, building and construction: Secondary | ICD-10-CM | POA: Insufficient documentation

## 2012-01-08 DIAGNOSIS — R079 Chest pain, unspecified: Secondary | ICD-10-CM | POA: Insufficient documentation

## 2012-01-08 DIAGNOSIS — E119 Type 2 diabetes mellitus without complications: Secondary | ICD-10-CM | POA: Insufficient documentation

## 2012-01-08 DIAGNOSIS — F17200 Nicotine dependence, unspecified, uncomplicated: Secondary | ICD-10-CM | POA: Insufficient documentation

## 2012-01-08 DIAGNOSIS — Z7982 Long term (current) use of aspirin: Secondary | ICD-10-CM | POA: Insufficient documentation

## 2012-01-08 DIAGNOSIS — K219 Gastro-esophageal reflux disease without esophagitis: Secondary | ICD-10-CM | POA: Insufficient documentation

## 2012-01-08 DIAGNOSIS — Y9289 Other specified places as the place of occurrence of the external cause: Secondary | ICD-10-CM | POA: Insufficient documentation

## 2012-01-08 DIAGNOSIS — J449 Chronic obstructive pulmonary disease, unspecified: Secondary | ICD-10-CM | POA: Insufficient documentation

## 2012-01-08 DIAGNOSIS — X30XXXA Exposure to excessive natural heat, initial encounter: Secondary | ICD-10-CM | POA: Insufficient documentation

## 2012-01-08 LAB — BASIC METABOLIC PANEL
CO2: 24 mEq/L (ref 19–32)
Calcium: 10 mg/dL (ref 8.4–10.5)
GFR calc non Af Amer: 80 mL/min — ABNORMAL LOW (ref >90)
Sodium: 137 mEq/L (ref 135–145)

## 2012-01-08 LAB — CBC
MCH: 31 pg (ref 26.0–34.0)
Platelets: 200 10*3/uL (ref 150–400)
RBC: 4.84 MIL/uL (ref 4.22–5.81)

## 2012-01-08 LAB — POCT I-STAT TROPONIN I: Troponin i, poc: 0 ng/mL (ref 0.00–0.08)

## 2012-01-08 MED ORDER — SODIUM CHLORIDE 0.9 % IV BOLUS (SEPSIS)
700.0000 mL | INTRAVENOUS | Status: AC
  Administered 2012-01-08: 700 mL via INTRAVENOUS

## 2012-01-08 NOTE — ED Notes (Signed)
Pt updated on wait status, pt stated "I am starting to feel a little better now".

## 2012-01-08 NOTE — ED Notes (Signed)
Chest "tightness,". With sob, Has been working  Out side, shoveling.

## 2012-01-08 NOTE — ED Provider Notes (Signed)
History   This chart was scribed for Ward Givens, MD by Sofie Rower. The patient was seen in room [facility]04/[facility]04 and the patient's care was started at 8:15 PM       CSN: [id number]  Arrival date & time 01/08/12  4132   First MD Initiated Contact with Patient 01/08/12 1928      Chief Complaint  Patient presents with  . Chest Pain    (Consider location/radiation/quality/duration/timing/severity/associated sxs/prior treatment) HPI  Richard [name] is a 46 y.o. male who presents to the Emergency Department complaining of chest pain onset today with associated symptoms of shortness of breath, sweating.. The pt was outside digging a waterline today for 20 minutes at around 4:40 pm. He was sweating a lot, saw black sots and felt dizzy, SOB and weak.  The pt informs the EDP that the chest pain was a tight pain, a pressure in the center of his chest. The pain was 3 hours in duration and stopped while in the ED. States he has had it before when he was outside in the heat. The pt is hungry now  and feels weak at present, but he is breathing all right. Modifying factors include taking 1 nitroglycerin which did not provide any relief, application of an inhaler which provides moderate relief. Pt has a hx of cramps in the legs. COPD, stomach acid, high cholesterol.  Pt denies nausea, vomiting, diarrhea, abd cramping  PCP is Dr. Juanetta Gosling but the [facility]. Cardiologist is Dr. Dietrich Pates.     Past Medical History  Diagnosis Date  . Chest pain     + palpitations; cath 2005- 30-40% mid LAD, 20% D1, 20% cx, OM, 20-30% RCA, and EF-55%  . COPD (chronic obstructive pulmonary disease)   . GERD (gastroesophageal reflux disease)   . Hyperlipemia   . Hypertension   . Depression   . Colitis 1990  . Tobacco abuse     1/2 pack per day  . Diabetes mellitus   . Gastric ulcer 2003; 2012    2003: + esophagitis; negative H.pylori serology  2012: Dr. Darrick Penna, mild gastritis, Bravo PH probe  placement, negative H.pylori  . Hepatic steatosis   . Chronic low back pain     Past Surgical History  Procedure Date  . Colonoscopy 1990  . Shoulder surgery     Right acromioclavicular joint arthrosis  . Neck mass excision   . Cardiac catheterization 2005  . Bravo ph study 05/03/2011    Procedure: BRAVO PH STUDY;  Surgeon: Arlyce Harman, MD;  Location: [facility];  Service: Endoscopy;;    Family History  Problem Relation Age of Onset  . Lung cancer Mother   . Heart attack Father 64  . Diabetes Father   . Hypertension Brother   . Colon cancer Neg Hx   . Hypertension Brother   . Heart attack Brother 77    History  Substance Use Topics  . Smoking status: Current Everyday Smoker -- 0.5 packs/day for 25 years    Types: Cigarettes  . Smokeless tobacco: Never Used  . Alcohol Use: No  Unemployed Pt is a smoker.  lives with spouse  Review of Systems  All other systems reviewed and are negative.    10 Systems reviewed and all are negative for acute change except as noted in the HPI.    Allergies  Mushroom ext cmplx(shiitake-reishi-mait) and Penicillins  Home Medications   Current Outpatient Rx  Name Route Sig Dispense Refill  .  ACETAMINOPHEN 325 MG PO TABS Oral Take 650 mg by mouth every 6 (six) hours as needed. For pain    . ALBUTEROL SULFATE HFA 108 (90 BASE) MCG/ACT IN AERS Inhalation Inhale 2 puffs into the lungs every 6 (six) hours as needed.    . ALPRAZOLAM 0.5 MG PO TABS Oral Take 0.5 mg by mouth at bedtime as needed.      . ASPIRIN 81 MG PO TABS Oral Take 81 mg by mouth at bedtime.     [name] CLINDAMYCIN HCL 150 MG PO CAPS Oral Take 150 mg by mouth daily as needed. Tooth abcess    . CYCLOBENZAPRINE HCL 10 MG PO TABS Oral Take 1 tablet (10 mg total) by mouth 3 (three) times daily as needed for muscle spasms. For Muscle spasms 90 tablet 0  . ESOMEPRAZOLE MAGNESIUM 40 MG PO CPDR Oral Take 40 mg by mouth 2 (two) times daily.    [name] HYDROCODONE-ACETAMINOPHEN 7.5-750 MG PO  TABS Oral Take 1 tablet by mouth every 6 (six) hours as needed. For back pain    . LISINOPRIL 20 MG PO TABS      . LOVASTATIN 20 MG PO TABS  TAKE ONE TABLET BY MOUTH EVERY DAY AT BEDTIME **REPLACES LIPITOR** 30 tablet 6  . METFORMIN HCL 500 MG PO TABS Oral Take 500 mg by mouth 2 (two) times daily with a meal.      . METOPROLOL SUCCINATE ER 50 MG PO TB24 Oral Take 75 mg by mouth at bedtime. 1 and 1/2 tab po daily    . NITROGLYCERIN 0.4 MG SL SUBL Sublingual Place 0.4 mg under the tongue every 5 (five) minutes as needed.      BP 138/77  Pulse 69  Temp 97.7 F (36.5 C) (Oral)  Resp 22  Ht 5\' 11"  (1.803 m)  Wt 209 lb (94.802 kg)  BMI 29.15 kg/m2  SpO2 100%  Vital signs normal    Physical Exam  Nursing note and vitals reviewed. Constitutional: He is oriented to person, place, and time. He appears well-developed and well-nourished.       Face was slightly flushed.  HENT:  Head: Normocephalic and atraumatic.  Right Ear: External ear normal.  Left Ear: External ear normal.  Nose: Nose normal.  Mouth/Throat: Oropharynx is clear and moist.  Eyes: Conjunctivae and EOM are normal. Pupils are equal, round, and reactive to light. Right eye exhibits no discharge. Left eye exhibits no discharge.  Neck: Normal range of motion. Neck supple.  Cardiovascular: Normal rate, regular rhythm, normal heart sounds and intact distal pulses.   Pulmonary/Chest: Effort normal and breath sounds normal. No respiratory distress. He has no wheezes. He has no rales. He exhibits no tenderness.  Abdominal: Soft. Bowel sounds are normal. He exhibits no distension. There is no tenderness. There is no rebound and no guarding.  Musculoskeletal: Normal range of motion. He exhibits no edema and no tenderness.  Neurological: He is alert and oriented to person, place, and time.  Skin: Skin is warm and dry.  Psychiatric: He has a normal mood and affect. His behavior is normal.    ED Course  Procedures (including  critical care time)   Medications  sodium chloride 0.9 % bolus 700 mL (700 mL Intravenous Given 01/08/12 2026)    Pt states he is feeling better now, just has some weakness  DIAGNOSTIC STUDIES: Oxygen Saturation is 100% on room air, normal by my interpretation.    COORDINATION OF CARE:  8:25PM- EDP at bedside  discusses treatment plan concerning laboratory results and x-ray results, 2nd heart test.    Results for orders placed during the hospital encounter of 01/08/12  CBC      Component Value Range   WBC 8.1  4.0 - 10.5 K/uL   RBC 4.84  4.22 - 5.81 MIL/uL   Hemoglobin 15.0  13.0 - 17.0 g/dL   HCT 84.1  32.4 - 40.1 %   MCV 89.3  78.0 - 100.0 fL   MCH 31.0  26.0 - 34.0 pg   MCHC 34.7  30.0 - 36.0 g/dL   RDW 02.7  25.3 - 66.4 %   Platelets 200  150 - 400 K/uL  BASIC METABOLIC PANEL      Component Value Range   Sodium 137  135 - 145 mEq/L   Potassium 3.7  3.5 - 5.1 mEq/L   Chloride 100  96 - 112 mEq/L   CO2 24  19 - 32 mEq/L   Glucose, Bld 153 (*) 70 - 99 mg/dL   BUN 15  6 - 23 mg/dL   Creatinine, Ser 4.03  0.50 - 1.35 mg/dL   Calcium 47.4  8.4 - 25.9 mg/dL   GFR calc non Af Amer 80 (*) >90 mL/min   GFR calc Af Amer >90  >90 mL/min  POCT I-STAT TROPONIN I      Component Value Range   Troponin i, poc 0.00  0.00 - 0.08 ng/mL   Comment 3           TROPONIN I      Component Value Range   Troponin I <0.30  <0.30 ng/mL    Laboratory interpretation all normal   Chest Portable 1 View  01/08/2012  *RADIOLOGY REPORT*  Clinical Data: Chest pain  PORTABLE CHEST - 1 VIEW  Comparison: 04/07/2011  Findings: Cardiomediastinal silhouette is stable.  No acute infiltrate or pleural effusion.  No pulmonary edema.  Bony thorax is stable.  IMPRESSION: No active disease.  Original Report Authenticated By: Natasha Mead, M.D.     Date: 01/08/2012  Rate: 64  Rhythm: normal sinus rhythm  QRS Axis: normal  Intervals: normal  ST/T Wave abnormalities: normal  Conduction Disutrbances:none   Narrative Interpretation:   Old EKG Reviewed: unchanged from 04/07/2011     1. Chest pain   2. Heat exhaustion    Plan discharge  Devoria Albe, MD, FACEP    MDM   I personally performed the services described in this documentation, which was scribed in my presence. The recorded information has been reviewed and considered.  Devoria Albe, MD, Armando Gang    Ward Givens, MD 01/08/12 2207

## 2012-01-08 NOTE — ED Notes (Signed)
Discharge instructions reviewed with pt, questions answered. Pt verbalized understanding.  

## 2012-01-08 NOTE — Discharge Instructions (Signed)
Try to stay cool. Drink a lot of fluids, especially sports drinks when you are outside. Recheck if you feel worse again.    Heat Disorders Heat related disorders are illnesses caused by continued exposure to hot and humid environments, not drinking enough fluids, and/or your body failing to regulate its temperature correctly. People suffer from heat stress and heat related disorders when their bodies are unable to compensate and cool down through sweating. With sufficient heat, sweating is not enough to keep you cool, and your body temperature can rise quickly. Very high body temperatures can damage your brain and other vital organs. High humidity (moisture in the air), adds to heat stress, because it is harder for sweat to evaporate and cool your body. Heat stress and disorders are not uncommon. Some medicines can increase your risk for heat related illness. Ask your caregiver about your medicines during periods of intense heat.  Heat related disorders include:  Heatstroke. When you cannot sweat or regulate your body temperature in an adequate way. This is very dangerous and can be life threatening. Get emergency medical help.   Heat exhaustion. Overheating causes heavy sweating and a fast heart rate. Your body can still regulate its own temperature.   Heat cramps. Painful, uncontrollable muscle spasms. Can occur during heavy exercise in hot environments.   Sunburn. Skin becomes red and painful (burned) after being out in the sun.   Heat rash. Sweat ducts become blocked, which traps sweat under the skin. This causes blisters and red bumps and may cause an itchy or tingling feeling.  PREVENTING HEAT STRESS AND HEAT RELATED DISORDERS Overheating can be dangerous and life threatening. When exercising, working, or doing other activities in hot and humid environments, do the following:  Stay informed by listening to and watching local broadcast weather and safety updates during intense heat.   Air  conditioning is the best way to prevent heat disorders. If your home is not air conditioned, spend time in air conditioned places (malls, [employer], or heat shelters set up by your local health department).   Wear light-weight, light colored, loose fitting clothing. Wear as little clothing as possible when at home.   Increase your fluid intake. Drink enough water and fluids to keep your urine clear or pale yellow. DO NOT WAIT UNTIL YOU ARE THIRSTY TO DRINK. You may already be heat stressed, and not recognize it.   If your caregiver has suggested that you limit the amount of fluid you drink or has prescribed water pills for a medical problem, ask how much you should drink when the weather is hot.   Do not drink liquids with alcohol, caffeine, or lots of sugar. They can cause more loss of body fluid.   Heavy sweating drains your body's salt and minerals, which must be replaced. If you must exercise in the heat, a sports beverage can replace the salt and minerals you lose in sweat. If you are on a low-salt diet, check with your caregiver before drinking a sports beverage.   Sunburn reduces your body's ability to cool itself and causes a loss of needed body fluids. If you go outdoors, protect yourself from the sun by wearing a wide-brimmed hat, along with sunglasses.   Put on sunscreen of SPF 15 or higher, 30 minutes before going out. (The most effective products say "broad spectrum" or "UVA/UVB protection" on the label.) Reapply sunscreen frequently -- at least every 1-2 hours.   Take added precautions when both the heat and humidity  are high.   Rest often.   Even young and otherwise healthy people can become heat stressed and suffer from a heat disorder, if they participate in strenuous activities during hot weather.   If you must be outdoors, try going out only during morning and evening hours, when it is cooler. Rest often in shady areas, so that your body's temperature can adjust.    If your heart pounds or you are gasping for breath, STOP all activity. Go immediately to a cool area, or at least into the shade, and rest. This is especially true if you become lightheaded, confused, weak, or faint.   Electric fans may make you comfortable, but they DO NOT prevent heat related problems.  SYMPTOMS   Headache.   Nosebleed.   Weakness.   You feel very hot.   Muscle cramps.   Restlessness.   Fainting or dizziness.   Fast breathing and shortness of breath.   Excessive sweating. (There may be little or no sweating in late stages of heat exhaustion.)   Rapid pulse, heart pounding.   Feeling sick to your stomach (nauseous, vomiting).   Skin becoming cold and clammy, or excessively hot and dry.  HOME CARE INSTRUCTIONS   Lie down and rest in a cool or air conditioned area.   Drink enough water and fluids to keep your urine clear or pale yellow. Avoid fluids with caffeine or high sugar content. Avoid coffee, tea, alcohol or stimulants.   Do not take salt tablets, unless advised by your caregiver.   Avoid hot foods and heavy meals.   Bathe or shower in cool water.   Wear minimal clothing.   Use a fan. Add cool or warm mist to the air, if possible.   If possible, decrease the use of your stove or oven at home.   Monitor adults at risk at least twice a day, watching closely for signs of heat exhaustion or heat stroke. Infants and young children also require more frequent watching.   Never leave infants, children or pets in a parked car, even if the windows are cracked open.   If you are 77 years of age or older, have a friend or relative call to check on you twice a day during a heat wave. If you know someone in this age group, check on them at least twice a day.  SEEK IMMEDIATE MEDICAL CARE IF:  You have a hard time breathing.   You vomit or pass blood in your stool.   You have a seizure, feel dizzy or faint, or pass out.   You develop severe  sweating.   Your skin is red, hot and dry (there is no sweating).   Your urine turns a dark color or has blood in it.   You are making very little or no urine.   You are unable to keep fluids down.   You develop chest or abdominal pain.   You develop a throbbing headache.   You develop nausea or confusion.  IF YOU OBSERVE SOMEONE WHO MIGHT HAVE HEAT STROKE This can be life threatening. Call your local emergency services (911 in the [place]  If the victim is in the sun, get him or her to a shady area.   Cool the victim rapidly, using whatever methods you have:   Place the victim in a tub of cool water or a cool shower.   Spray the victim with cool water from a garden hose, or sponge the person with cool water.  Wrap the victim in a cool, wet sheet and fan them.   If emergency medical help is delayed, call the hospital emergency room or your local emergency services (911 in the [place] for further instructions.   Sometimes a victim's muscles will begin to twitch from heat stroke. If this happens, keep the victim from injuring himself. However, do not place any object in the mouth. Give fluids, unless the muscle twitching makes it difficult or unsafe to do so. If there is vomiting, make sure the airway remains open by turning the victim on his or her side.  Document Released: 06/30/2000 Document Revised: 06/22/2011 Document Reviewed: 04/19/2009 [facility] Patient Information 2012 [place], [place].

## 2012-01-08 NOTE — ED Notes (Signed)
Pt states he was out in the hot weather today and developed chest tightness that will not go away. Pt states about a week ago he had a similar symptoms while driving but they went away.

## 2012-01-22 ENCOUNTER — Encounter: Payer: Self-pay | Admitting: Cardiology

## 2012-01-24 ENCOUNTER — Other Ambulatory Visit (HOSPITAL_COMMUNITY): Payer: Self-pay | Admitting: Physician Assistant

## 2012-01-24 DIAGNOSIS — R109 Unspecified abdominal pain: Secondary | ICD-10-CM

## 2012-01-25 ENCOUNTER — Encounter (HOSPITAL_COMMUNITY): Payer: Self-pay

## 2012-01-25 ENCOUNTER — Ambulatory Visit (HOSPITAL_COMMUNITY)
Admission: RE | Admit: 2012-01-25 | Discharge: 2012-01-25 | Disposition: A | Discharge status: Home / Self Care | Payer: Self-pay | Source: Ambulatory Visit | Attending: Physician Assistant | Admitting: Physician Assistant

## 2012-01-25 DIAGNOSIS — R109 Unspecified abdominal pain: Secondary | ICD-10-CM | POA: Insufficient documentation

## 2012-01-25 MED ORDER — IOHEXOL 300 MG/ML  SOLN
100.0000 mL | Freq: Once | INTRAMUSCULAR | Status: AC | PRN
  Administered 2012-01-25: 100 mL via INTRAVENOUS

## 2012-02-02 ENCOUNTER — Encounter: Payer: Self-pay | Admitting: Gastroenterology

## 2012-02-05 ENCOUNTER — Ambulatory Visit: Payer: Self-pay | Admitting: Gastroenterology

## 2012-02-09 ENCOUNTER — Ambulatory Visit (INDEPENDENT_AMBULATORY_CARE_PROVIDER_SITE_OTHER): Payer: Self-pay | Admitting: Urgent Care

## 2012-02-09 ENCOUNTER — Encounter: Payer: Self-pay | Admitting: Urgent Care

## 2012-02-09 VITALS — BP 113/75 | HR 57 | Temp 97.3°F | Ht 71.0 in | Wt 211.8 lb

## 2012-02-09 DIAGNOSIS — R109 Unspecified abdominal pain: Secondary | ICD-10-CM | POA: Insufficient documentation

## 2012-02-09 DIAGNOSIS — K219 Gastro-esophageal reflux disease without esophagitis: Secondary | ICD-10-CM

## 2012-02-09 NOTE — Assessment & Plan Note (Signed)
Richard [name] is a pleasant 46 y.o. male with non-ulcer dyspepsia.  Recent bout of constipation-resolved.  Reassure nothing to resemble mass on CT.  ? Stool in left colon at the time of exam.   Call if recurrent pain or constipation.[name]  iFOBT to complete work-up.  Colonoscopy age 43.

## 2012-02-09 NOTE — Progress Notes (Signed)
Faxed to PCP

## 2012-02-09 NOTE — Progress Notes (Signed)
Primary Care Physician: [facility] [place], [place])  Chief Complaint  Patient presents with  . Abdominal Pain   HPI:  Richard [name] is a pleasant 46 y.o. male  With non-ulcer dyspepsia.  He was recently seen at [facility].  On routine exam he was told he had a "mass"  In his LLQ, but this was not confirmed on CT.  He was having abdominal pain at that time but was constipated too.  He denies any abdominal pain now.  He does occasionally have "gas buildup".  No swignificant hearttburn & indigestion.  Taking Nexium 40mg  once or twice daily.  He admits it works the best & he has tried many different PPIs over the years.  Denies NSAIDS,  Occasionally takes a TUMS for breakthrough.  01/24/12 CT A/P with contrast->normal.  Weight & appetite stable.  Recent Results (from the past 1344 hour(s))  CBC   Collection Time   01/08/12  6:40 PM      Component Value Range   WBC 8.1  4.0 - 10.5 K/uL   RBC 4.84  4.22 - 5.81 MIL/uL   Hemoglobin 15.0  13.0 - 17.0 g/dL   HCT 74.2  59.5 - 63.8 %   MCV 89.3  78.0 - 100.0 fL   MCH 31.0  26.0 - 34.0 pg   MCHC 34.7  30.0 - 36.0 g/dL   RDW 75.6  43.3 - 29.5 %   Platelets 200  150 - 400 K/uL  BASIC METABOLIC PANEL   Collection Time   01/08/12  6:40 PM      Component Value Range   Sodium 137  135 - 145 mEq/L   Potassium 3.7  3.5 - 5.1 mEq/L   Chloride 100  96 - 112 mEq/L   CO2 24  19 - 32 mEq/L   Glucose, Bld 153 (*) 70 - 99 mg/dL   BUN 15  6 - 23 mg/dL   Creatinine, Ser 1.88  0.50 - 1.35 mg/dL   Calcium 41.6  8.4 - 60.6 mg/dL   GFR calc non Af Amer 80 (*) >90 mL/min   GFR calc Af Amer >90  >90 mL/min  POCT I-STAT TROPONIN I   Collection Time   01/08/12  6:46 PM      Component Value Range   Troponin i, poc 0.00  0.00 - 0.08 ng/mL   Comment 3           TROPONIN I   Collection Time   01/08/12  8:29 PM      Component Value Range   Troponin I <0.30  <0.30 ng/mL   Past Medical History  Diagnosis Date  . Chest pain     +  palpitations; cath 2005- 30-40% mid LAD, 20% D1, 20% cx, OM, 20-30% RCA, and EF-55%  . COPD (chronic obstructive pulmonary disease)   . GERD (gastroesophageal reflux disease)   . Hyperlipemia   . Hypertension   . Depression   . Colitis 1990  . Tobacco abuse     1/2 pack per day  . Diabetes mellitus   . Gastric ulcer 2003; 2012    2003: + esophagitis; negative H.pylori serology  2012: Dr. Darrick Penna, mild gastritis, Bravo PH probe placement, negative H.pylori  . Hepatic steatosis   . Chronic low back pain     Past Surgical History  Procedure Date  . Colonoscopy 1990  . Shoulder surgery     Right acromioclavicular joint arthrosis  . Neck mass excision   .  Cardiac catheterization 2005  . Bravo ph study 05/03/2011    Mild gastritis/normal esophagus and duodenum    Current Outpatient Prescriptions  Medication Sig Dispense Refill  . acetaminophen (TYLENOL) 325 MG tablet Take 650 mg by mouth every 6 (six) hours as needed. For pain      . albuterol (VENTOLIN HFA) 108 (90 BASE) MCG/ACT inhaler Inhale 2 puffs into the lungs every 6 (six) hours as needed.      . ALPRAZolam (XANAX) 0.5 MG tablet Take 0.5 mg by mouth at bedtime as needed.        [name] aspirin 81 MG tablet Take 81 mg by mouth at bedtime.       . cyclobenzaprine (FLEXERIL) 10 MG tablet Take 1 tablet (10 mg total) by mouth 3 (three) times daily as needed for muscle spasms. For Muscle spasms  90 tablet  0  . esomeprazole (NEXIUM) 40 MG capsule Take 40 mg by mouth 2 (two) times daily.      [name] HYDROcodone-acetaminophen (VICODIN ES) 7.5-750 MG per tablet Take 1 tablet by mouth every 6 (six) hours as needed. For back pain      . lisinopril (PRINIVIL,ZESTRIL) 20 MG tablet Take 20 mg by mouth daily.       [name] lovastatin (MEVACOR) 20 MG tablet TAKE ONE TABLET BY MOUTH EVERY DAY AT BEDTIME **REPLACES LIPITOR**  30 tablet  6  . metFORMIN (GLUCOPHAGE) 500 MG tablet Take 500 mg by mouth 2 (two) times daily with a meal.        . metoprolol succinate  (TOPROL-XL) 50 MG 24 hr tablet Take 75 mg by mouth at bedtime. 1 and 1/2 tab po daily      . nitroGLYCERIN (NITROSTAT) 0.4 MG SL tablet Place 0.4 mg under the tongue every 5 (five) minutes as needed.        Allergies as of 02/09/2012 - Review Complete 02/09/2012  Allergen Reaction Noted  . Mushroom ext cmplx(shiitake-reishi-mait) Anaphylaxis 03/22/2011  . Penicillins      Review of Systems: Gen: Denies any fever, chills, sweats, anorexia, fatigue, weakness, malaise, weight loss, and sleep disorder CV: Denies chest pain, angina, palpitations, syncope, orthopnea, PND, peripheral edema, and claudication. Resp: Denies dyspnea at rest, dyspnea with exercise, cough, sputum, wheezing, coughing up blood, and pleurisy. GI: Denies vomiting blood, jaundice, and fecal incontinence.   Derm: Denies rash, itching, dry skin, hives, moles, warts, or unhealing ulcers.  Psych: Denies depression, anxiety, memory loss, suicidal ideation, hallucinations, paranoia, and confusion. Heme: Denies bruising, bleeding, and enlarged lymph nodes.  Physical Exam: BP 113/75  Pulse 57  Temp 97.3 F (36.3 C) (Temporal)  Ht 5\' 11"  (1.803 m)  Wt 211 lb 12.8 oz (96.072 kg)  BMI 29.54 kg/m2 General:   Alert,  Well-developed, well-nourished, pleasant and cooperative in NAD. Wife at bedside. Eyes:  Sclera clear, no icterus.   Conjunctiva pink. Mouth:  No deformity or lesions, oropharynx pink and moist. Neck:  Supple; no masses or thyromegaly. Heart:  Regular rate and rhythm; no murmurs, clicks, rubs,  or gallops. Abdomen:  Normal bowel sounds.  No bruits.  Soft, non-tender and non-distended without masses, hepatosplenomegaly or hernias noted.  No guarding or rebound tenderness.   Rectal:  Deferred. Msk:  Symmetrical without gross deformities.  Pulses:  Normal pulses noted. Extremities:  No clubbing or edema. Neurologic:  Alert and oriented x4;  grossly normal neurologically. Skin:  Intact without significant lesions or  rashes.

## 2012-02-09 NOTE — Patient Instructions (Addendum)
Return iFOBT as soon as possible Screening Colonoscopy age 46  1-800-QUIT-NOW for help smoking.

## 2012-02-09 NOTE — Assessment & Plan Note (Addendum)
Well-controlled on Nexium 40mg  BID.

## 2012-03-07 NOTE — Progress Notes (Signed)
REVIEWED.   OPV IN 1 YEAR W/ SLF E 30

## 2012-03-11 NOTE — Progress Notes (Signed)
Reminder in epic to follow up in one year with SF in E30 

## 2012-04-11 ENCOUNTER — Encounter: Payer: Self-pay | Admitting: Cardiology

## 2012-04-11 ENCOUNTER — Ambulatory Visit (INDEPENDENT_AMBULATORY_CARE_PROVIDER_SITE_OTHER): Payer: Self-pay | Admitting: Cardiology

## 2012-04-11 VITALS — BP 116/76 | HR 61 | Ht 71.0 in | Wt 212.4 lb

## 2012-04-11 DIAGNOSIS — M545 Low back pain: Secondary | ICD-10-CM

## 2012-04-11 DIAGNOSIS — E785 Hyperlipidemia, unspecified: Secondary | ICD-10-CM

## 2012-04-11 DIAGNOSIS — F419 Anxiety disorder, unspecified: Secondary | ICD-10-CM

## 2012-04-11 DIAGNOSIS — R55 Syncope and collapse: Secondary | ICD-10-CM

## 2012-04-11 DIAGNOSIS — I10 Essential (primary) hypertension: Secondary | ICD-10-CM

## 2012-04-11 DIAGNOSIS — G8929 Other chronic pain: Secondary | ICD-10-CM

## 2012-04-11 DIAGNOSIS — F329 Major depressive disorder, single episode, unspecified: Secondary | ICD-10-CM

## 2012-04-11 DIAGNOSIS — J449 Chronic obstructive pulmonary disease, unspecified: Secondary | ICD-10-CM

## 2012-04-11 DIAGNOSIS — I2510 Atherosclerotic heart disease of native coronary artery without angina pectoris: Secondary | ICD-10-CM | POA: Insufficient documentation

## 2012-04-11 DIAGNOSIS — K219 Gastro-esophageal reflux disease without esophagitis: Secondary | ICD-10-CM

## 2012-04-11 DIAGNOSIS — F411 Generalized anxiety disorder: Secondary | ICD-10-CM

## 2012-04-11 DIAGNOSIS — I7090 Unspecified atherosclerosis: Secondary | ICD-10-CM

## 2012-04-11 DIAGNOSIS — F341 Dysthymic disorder: Secondary | ICD-10-CM

## 2012-04-11 DIAGNOSIS — F17200 Nicotine dependence, unspecified, uncomplicated: Secondary | ICD-10-CM

## 2012-04-11 DIAGNOSIS — R002 Palpitations: Secondary | ICD-10-CM

## 2012-04-11 NOTE — Assessment & Plan Note (Signed)
Patient is aware of his anxiety, which is fairly well controlled on modest doses of alprazolam.  [facility] has a policy against prescribing benzodiazepines or narcotics.  We will arrange for assessment and treatment by Behavioral Health.

## 2012-04-11 NOTE — Assessment & Plan Note (Signed)
Palpitations persist, but by description did not appear to represent a significant arrhythmia.

## 2012-04-11 NOTE — Assessment & Plan Note (Signed)
Chest discomfort is not highly suggestive of myocardial ischemia.  Treatment for anxiety will be undertaken prior to considering additional cardiac testing.

## 2012-04-11 NOTE — Assessment & Plan Note (Addendum)
Patient has failed to continue to reduce tobacco use over the past few months, but is insistent that he will do so, and that he is committed to quit.  Consideration of pharmacologic assistance may be necessary in the near future.

## 2012-04-11 NOTE — Patient Instructions (Addendum)
Your physician recommends that you schedule a follow-up appointment in: 6 - 8 weeks  Behavioral Health Referral for anxiety

## 2012-04-11 NOTE — Assessment & Plan Note (Addendum)
Blood pressure control has been good with current therapy, which will be continued.

## 2012-04-11 NOTE — Progress Notes (Signed)
Patient ID: Richard [name], male   DOB: December 18, 1965, 46 y.o.   MRN: [id number]  HPI: Scheduled return visit at patient's request for assessment of chest discomfort.  This nice gentleman had insignificant coronary disease when last assessed in 2005 and a negative pharmacologic stress nuclear study in 2009.  He has a long history of atypical chest discomfort with some response to nitroglycerin.  He is chronically treated for gastroesophageal reflux disease and anxiety, but is having difficulty securing prescriptions for benzodiazepines.  He has applied for disability, primarily on the basis of chronic low back pain.  He has tapered cigarette smoking to current consumption of 0.5 pack per day.  Prior to Admission medications   Medication Sig Start Date End Date Taking? Authorizing Provider  acetaminophen (TYLENOL) 325 MG tablet Take 650 mg by mouth every 6 (six) hours as needed. For pain   Yes Historical Provider, MD  albuterol (VENTOLIN HFA) 108 (90 BASE) MCG/ACT inhaler Inhale 2 puffs into the lungs every 6 (six) hours as needed.   Yes Historical Provider, MD  ALPRAZolam Prudy Feeler) 0.5 MG tablet Take 0.5 mg by mouth at bedtime as needed.     Yes Historical Provider, MD  aspirin 81 MG tablet Take 81 mg by mouth at bedtime.    Yes Historical Provider, MD  cyclobenzaprine (FLEXERIL) 10 MG tablet Take 1 tablet (10 mg total) by mouth 3 (three) times daily as needed for muscle spasms. For Muscle spasms 04/21/11 04/20/12 Yes Jodelle Gross, NP  esomeprazole (NEXIUM) 40 MG capsule Take 40 mg by mouth 2 (two) times daily. 12/25/11  Yes Tiffany Kocher, PA  Fluticasone-Salmeterol (ADVAIR) 250-50 MCG/DOSE AEPB Inhale 1 puff into the lungs every 12 (twelve) hours.   Yes Historical Provider, MD  HYDROcodone-acetaminophen (VICODIN ES) 7.5-750 MG per tablet Take 1 tablet by mouth every 6 (six) hours as needed. For back pain   Yes Historical Provider, MD  lisinopril (PRINIVIL,ZESTRIL) 20 MG tablet Take 20 mg by mouth daily.   11/11/11  Yes Kathlen Brunswick, MD  lovastatin (MEVACOR) 20 MG tablet TAKE ONE TABLET BY MOUTH EVERY DAY AT BEDTIME **REPLACES LIPITOR** 10/11/11  Yes Jodelle Gross, NP  metFORMIN (GLUCOPHAGE) 500 MG tablet Take 500 mg by mouth 2 (two) times daily with a meal.     Yes Historical Provider, MD  metoprolol succinate (TOPROL-XL) 50 MG 24 hr tablet Take 75 mg by mouth at bedtime. 1 and 1/2 tab po daily 11/02/11  Yes Kathlen Brunswick, MD  nitroGLYCERIN (NITROSTAT) 0.4 MG SL tablet Place 0.4 mg under the tongue every 5 (five) minutes as needed.   Yes Historical Provider, MD   Allergies  Allergen Reactions  . Mushroom Ext Cmplx(Shiitake-Reishi-Mait) Anaphylaxis    Rapid heart rate.  . Penicillins     REACTION: Unknown reaction  Past medical history, social history, and family history reviewed and updated.  ROS: Denies orthopnea, PND, lightheadedness or syncope.  All other systems reviewed and are negative.  PHYSICAL EXAM: BP 116/76  Pulse 61  Ht 5\' 11"  (1.803 m)  Wt 96.344 kg (212 lb 6.4 oz)  BMI 29.62 kg/m2 ; no orthostatic change in blood pressure General-Well developed; no acute distress Body habitus-Mildly overweight Neck-No JVD; no carotid bruits Lungs-clear lung fields; resonant to percussion; mildly prolonged expiratory phase Cardiovascular-normal PMI; normal S1 and S2 Abdomen-normal bowel sounds; soft and non-tender without masses or organomegaly Musculoskeletal-No deformities, no cyanosis or clubbing Neurologic-Normal cranial nerves; symmetric strength and tone Skin-Warm, no significant lesions Extremities-distal  pulses intact; no edema  EKG: Normal sinus rhythm.  ASSESSMENT AND PLAN:  [place] Bing, MD 04/11/2012 12:33 PM

## 2012-04-11 NOTE — Assessment & Plan Note (Signed)
Chronic lung disease is fairly mild at present.  Patient advised of the importance of discontinuing tobacco use if he wishes to maintain his lungs in fairly good condition.

## 2012-04-11 NOTE — Progress Notes (Deleted)
Name: JAISHON KRISHER    DOB: 16-Sep-1965  Age: 46 y.o.  MR#: [id number]       PCP:  Provider Not In System      Insurance: @PAYORNAME @   CC:   No chief complaint on file.   VS BP 116/76  Pulse 61  Ht 5\' 11"  (1.803 m)  Wt 212 lb 6.4 oz (96.344 kg)  BMI 29.62 kg/m2  Weights Current Weight  04/11/12 212 lb 6.4 oz (96.344 kg)  02/09/12 211 lb 12.8 oz (96.072 kg)  01/08/12 209 lb (94.802 kg)    Blood Pressure  BP Readings from Last 3 Encounters:  04/11/12 116/76  02/09/12 113/75  01/08/12 138/77     Admit date:  (Not on file) Last encounter with RMR:  01/22/2012   Allergy Allergies  Allergen Reactions  . Mushroom Ext Cmplx(Shiitake-Reishi-Mait) Anaphylaxis    Rapid heart rate.  . Penicillins     REACTION: Unknown reaction    Current Outpatient Prescriptions  Medication Sig Dispense Refill  . acetaminophen (TYLENOL) 325 MG tablet Take 650 mg by mouth every 6 (six) hours as needed. For pain      . albuterol (VENTOLIN HFA) 108 (90 BASE) MCG/ACT inhaler Inhale 2 puffs into the lungs every 6 (six) hours as needed.      . ALPRAZolam (XANAX) 0.5 MG tablet Take 0.5 mg by mouth at bedtime as needed.        [name] aspirin 81 MG tablet Take 81 mg by mouth at bedtime.       . cyclobenzaprine (FLEXERIL) 10 MG tablet Take 1 tablet (10 mg total) by mouth 3 (three) times daily as needed for muscle spasms. For Muscle spasms  90 tablet  0  . esomeprazole (NEXIUM) 40 MG capsule Take 40 mg by mouth 2 (two) times daily.      . Fluticasone-Salmeterol (ADVAIR) 250-50 MCG/DOSE AEPB Inhale 1 puff into the lungs every 12 (twelve) hours.      [name] HYDROcodone-acetaminophen (VICODIN ES) 7.5-750 MG per tablet Take 1 tablet by mouth every 6 (six) hours as needed. For back pain      . lisinopril (PRINIVIL,ZESTRIL) 20 MG tablet Take 20 mg by mouth daily.       [name] lovastatin (MEVACOR) 20 MG tablet TAKE ONE TABLET BY MOUTH EVERY DAY AT BEDTIME **REPLACES LIPITOR**  30 tablet  6  . metFORMIN (GLUCOPHAGE) 500 MG tablet  Take 500 mg by mouth 2 (two) times daily with a meal.        . metoprolol succinate (TOPROL-XL) 50 MG 24 hr tablet Take 75 mg by mouth at bedtime. 1 and 1/2 tab po daily      . nitroGLYCERIN (NITROSTAT) 0.4 MG SL tablet Place 0.4 mg under the tongue every 5 (five) minutes as needed.        Discontinued Meds:   There are no discontinued medications.  Patient Active Problem List  Diagnosis  . HYPERLIPIDEMIA  . GASTROESOPHAGEAL REFLUX DISEASE  . Hepatic steatosis  . TOBACCO ABUSE  . CHRONIC OBSTRUCTIVE PULMONARY DISEASE  . DIABETES MELLITUS-TYPE II  . Hypertension  . Depression  . Palpitations  . Chronic low back pain  . Dyspepsia  . GERD (gastroesophageal reflux disease)  . Left sided abdominal pain    LABS No visits with results within 3 Month(s) from this visit. Latest known visit with results is:  Admission on 01/08/2012, Discharged on 01/08/2012  Component Date Value  . WBC 01/08/2012 8.1   . RBC 01/08/2012 4.84   .  Hemoglobin 01/08/2012 15.0   . HCT 01/08/2012 43.2   . MCV 01/08/2012 89.3   . [facility] 01/08/2012 31.0   . MCHC 01/08/2012 34.7   . RDW 01/08/2012 12.7   . Platelets 01/08/2012 200   . Sodium 01/08/2012 137   . Potassium 01/08/2012 3.7   . Chloride 01/08/2012 100   . CO2 01/08/2012 24   . Glucose, Bld 01/08/2012 153*  . BUN 01/08/2012 15   . Creatinine, Ser 01/08/2012 1.08   . Calcium 01/08/2012 10.0   . GFR calc non Af Amer 01/08/2012 80*  . GFR calc Af Amer 01/08/2012 >90   . Troponin i, poc 01/08/2012 0.00   . Comment 3 01/08/2012          . Troponin I 01/08/2012 <0.30      Results for this Opt Visit:     Results for orders placed during the hospital encounter of 01/08/12  CBC      Component Value Range   WBC 8.1  4.0 - 10.5 K/uL   RBC 4.84  4.22 - 5.81 MIL/uL   Hemoglobin 15.0  13.0 - 17.0 g/dL   HCT 45.4  09.8 - 11.9 %   MCV 89.3  78.0 - 100.0 fL   MCH 31.0  26.0 - 34.0 pg   MCHC 34.7  30.0 - 36.0 g/dL   RDW 14.7  82.9 - 56.2 %    Platelets 200  150 - 400 K/uL  BASIC METABOLIC PANEL      Component Value Range   Sodium 137  135 - 145 mEq/L   Potassium 3.7  3.5 - 5.1 mEq/L   Chloride 100  96 - 112 mEq/L   CO2 24  19 - 32 mEq/L   Glucose, Bld 153 (*) 70 - 99 mg/dL   BUN 15  6 - 23 mg/dL   Creatinine, Ser 1.30  0.50 - 1.35 mg/dL   Calcium 86.5  8.4 - 78.4 mg/dL   GFR calc non Af Amer 80 (*) >90 mL/min   GFR calc Af Amer >90  >90 mL/min  POCT I-STAT TROPONIN I      Component Value Range   Troponin i, poc 0.00  0.00 - 0.08 ng/mL   Comment 3           TROPONIN I      Component Value Range   Troponin I <0.30  <0.30 ng/mL    EKG Orders placed during the hospital encounter of 01/08/12  . EKG 12-LEAD  . EKG 12-LEAD  . EKG     Prior Assessment and Plan Problem List as of 04/11/2012            Cardiology Problems   HYPERLIPIDEMIA   Last Assessment & Plan Note   10/31/2011 Office Visit Signed 10/31/2011 12:49 PM by Kathlen Brunswick, MD    The lipid-lowering medication has been changed due to cost considerations.  A repeat lipid profile will be obtained as well as a metabolic profile.    Hypertension   Last Assessment & Plan Note   10/31/2011 Office Visit Addendum 11/02/2011  9:57 AM by Kathlen Brunswick, MD    BP control is excellent with current medications, which will be continued.  The beta blocker is adequately controlling palpitations without causing excessive bronchospasm.      Other   Palpitations   Last Assessment & Plan Note   07/04/2011 Office Visit Signed 07/04/2011  3:21 PM by Jodelle Gross, NP    Mr.Slape  continues to complain of chronic episodes of palpitations usually occuring at night.  He is taking his metoprolol but unfortunately continues to smoke and drink Mt. Dews. I have asked him to stop the caffeine by lunchtime and do his best to stop smoking. He uses an electric cigarette and will use this in the pm hours. I have given him Rx for magnesium oxide 400 mg BID, to assist with  palpitations. He will follow-up with Dr. Dietrich Pates in 6 months.     GASTROESOPHAGEAL REFLUX DISEASE   Last Assessment & Plan Note   02/09/2012 Office Visit Addendum 02/09/2012 10:30 AM by Joselyn Arrow, NP    Well-controlled on Nexium 40mg  BID.    Hepatic steatosis   Last Assessment & Plan Note   03/22/2011 Office Visit Signed 03/22/2011  4:40 PM by Kathlen Brunswick, MD    Hepatic disorder is managed by Dr. Lionel December.  The importance of weight loss and restriction of concentrated sweets from his diet was emphasized.    TOBACCO ABUSE   Last Assessment & Plan Note   10/31/2011 Office Visit Signed 10/31/2011 12:50 PM by Kathlen Brunswick, MD    Tobacco cessation advised.  Patient prefers to continue to taper consumption.    CHRONIC OBSTRUCTIVE PULMONARY DISEASE   Last Assessment & Plan Note   10/31/2011 Office Visit Signed 10/31/2011 12:48 PM by Kathlen Brunswick, MD    Chronic obstructive pulmonary disease is mild and not clearly causing symptoms.  Patient strongly advised to completely discontinue cigarette smoking before his lung disease does become symptomatic.    DIABETES MELLITUS-TYPE II   Last Assessment & Plan Note   10/31/2011 Office Visit Signed 10/31/2011 12:48 PM by Kathlen Brunswick, MD    A1c reportedly excellent a few months ago.  Patient advised to discontinue intake of sweet beverages, continue to lose weight and to monitor his CBGs occasionally    Depression   Chronic low back pain   Last Assessment & Plan Note   10/31/2011 Office Visit Signed 10/31/2011 12:51 PM by Kathlen Brunswick, MD    Back pain is the immediate problem most interfering with his daily activities, capacity to work and [employer] of life.  I will attempt to locate a specialist I will see him in the absence of healthcare coverage.    Dyspepsia   Last Assessment & Plan Note   11/29/2011 Office Visit Signed 11/29/2011  4:23 PM by [place], MD    RESOLVED . SX EXACERBATED BY ANXIETY. DID NOT START  IMIPRAMINE. NOW TAKING PPI REGULARLY DUE TO GETTING PT ASSISTANCE.   Continue Nexium. Take 30 minutes prior to meals and at bedtime. MAIL PRESCRIPTION TO PHARMACY.  FOLLOW GERD LIFE STYLE RECOMMENDATIONS.   FOLLOW A LOW FAT DIET.   FOLLOW UP WITH MENTAL HEALTH.  FOLLOW UP IN 6 MOS.    GERD (gastroesophageal reflux disease)   Left sided abdominal pain   Last Assessment & Plan Note   02/09/2012 Office Visit Signed 02/09/2012 10:29 AM by Joselyn Arrow, NP    Peggye Pitt is a pleasant 46 y.o. male with non-ulcer dyspepsia.  Recent bout of constipation-resolved.  Reassure nothing to resemble mass on CT.  ? Stool in left colon at the time of exam.   Call if recurrent pain or constipation.[name]  iFOBT to complete work-up.  Colonoscopy age 58.          Imaging: No results found.   FRS Calculation: Score not calculated.  Missing: Total Cholesterol

## 2012-04-11 NOTE — Assessment & Plan Note (Signed)
Hyperlipidemia adequately controlled when assessed a few months ago.  Current therapy will be maintained.

## 2012-05-16 ENCOUNTER — Other Ambulatory Visit: Payer: Self-pay | Admitting: Adult Health

## 2012-05-17 ENCOUNTER — Encounter: Payer: Self-pay | Admitting: Gastroenterology

## 2012-05-27 ENCOUNTER — Ambulatory Visit (INDEPENDENT_AMBULATORY_CARE_PROVIDER_SITE_OTHER): Payer: Self-pay | Admitting: Cardiology

## 2012-05-27 ENCOUNTER — Encounter: Payer: Self-pay | Admitting: Cardiology

## 2012-05-27 VITALS — BP 100/50 | HR 68 | Ht 71.0 in | Wt 211.0 lb

## 2012-05-27 DIAGNOSIS — F419 Anxiety disorder, unspecified: Secondary | ICD-10-CM

## 2012-05-27 DIAGNOSIS — I7090 Unspecified atherosclerosis: Secondary | ICD-10-CM

## 2012-05-27 DIAGNOSIS — G8929 Other chronic pain: Secondary | ICD-10-CM

## 2012-05-27 DIAGNOSIS — F341 Dysthymic disorder: Secondary | ICD-10-CM

## 2012-05-27 DIAGNOSIS — M545 Low back pain, unspecified: Secondary | ICD-10-CM

## 2012-05-27 DIAGNOSIS — I2510 Atherosclerotic heart disease of native coronary artery without angina pectoris: Secondary | ICD-10-CM

## 2012-05-27 DIAGNOSIS — J4489 Other specified chronic obstructive pulmonary disease: Secondary | ICD-10-CM

## 2012-05-27 DIAGNOSIS — J449 Chronic obstructive pulmonary disease, unspecified: Secondary | ICD-10-CM

## 2012-05-27 DIAGNOSIS — I10 Essential (primary) hypertension: Secondary | ICD-10-CM

## 2012-05-27 NOTE — Assessment & Plan Note (Signed)
12/2011-Discussed with Dr. Romeo Apple.  He will evaluate pt despite absence of ability to pay for medical services.

## 2012-05-27 NOTE — Assessment & Plan Note (Signed)
Patient has been unable to secure an appointment at [facility].  We will attempt to intervene so that he can be seen relatively soon.

## 2012-05-27 NOTE — Patient Instructions (Addendum)
Your physician recommends that you schedule a follow-up appointment in:  1 - 4 months 2 - Referral will be made to Dr Romeo Apple for back pain 3 - Appt to be made before you leave today for behavioral health  Your physician has recommended you make the following change in your medication: May take tylenol or Aleve for chest pain

## 2012-05-27 NOTE — Assessment & Plan Note (Signed)
Insignificant coronary disease noted on catheterization in 2005.  No definite evidence for ischemia since.  Recent chest pain is noncardiac by patient's description

## 2012-05-27 NOTE — Progress Notes (Signed)
Name: Richard [name]    DOB: [birth year]  Age: 46 y.o.  MR#: [id number]       PCP:  Provider Not In System      Insurance: @PAYORNAME @   CC:   No chief complaint on file.  MEDICATION LIST REVIEWED   VS BP 100/50  Pulse 68  Ht 5\' 11"  (1.803 m)  Wt 211 lb (95.709 kg)  BMI 29.43 kg/m2  Weights Current Weight  05/27/12 211 lb (95.709 kg)  04/11/12 212 lb 6.4 oz (96.344 kg)  02/09/12 211 lb 12.8 oz (96.072 kg)    Blood Pressure  BP Readings from Last 3 Encounters:  05/27/12 100/50  04/11/12 116/76  02/09/12 113/75     Admit date:  (Not on file) Last encounter with RMR:  04/11/2012   Allergy Allergies  Allergen Reactions  . Mushroom Ext Cmplx(Shiitake-Reishi-Mait) Anaphylaxis    Rapid heart rate.  . Penicillins     REACTION: Unknown reaction    Current Outpatient Prescriptions  Medication Sig Dispense Refill  . acetaminophen (TYLENOL) 325 MG tablet Take 650 mg by mouth every 6 (six) hours as needed. For pain      . albuterol (VENTOLIN HFA) 108 (90 BASE) MCG/ACT inhaler Inhale 2 puffs into the lungs every 6 (six) hours as needed.      . ALPRAZolam (XANAX) 0.5 MG tablet Take 0.5 mg by mouth at bedtime as needed.        [name] aspirin 81 MG tablet Take 81 mg by mouth at bedtime.       . cyclobenzaprine (FLEXERIL) 10 MG tablet Take 1 tablet (10 mg total) by mouth 3 (three) times daily as needed for muscle spasms. For Muscle spasms  90 tablet  0  . esomeprazole (NEXIUM) 40 MG capsule Take 40 mg by mouth 2 (two) times daily.      . Fluticasone-Salmeterol (ADVAIR) 250-50 MCG/DOSE AEPB Inhale 1 puff into the lungs every 12 (twelve) hours.      [name] HYDROcodone-acetaminophen (VICODIN ES) 7.5-750 MG per tablet Take 1 tablet by mouth every 6 (six) hours as needed. For back pain      . lisinopril (PRINIVIL,ZESTRIL) 20 MG tablet Take 20 mg by mouth daily.       [name] lovastatin (MEVACOR) 20 MG tablet TAKE ONE TABLET BY MOUTH EVERY DAY AT BEDTIME **REPLACES  LIPITOR**  30 tablet  5  . metFORMIN  (GLUCOPHAGE) 500 MG tablet Take 500 mg by mouth 2 (two) times daily with a meal.        . metoprolol succinate (TOPROL-XL) 50 MG 24 hr tablet Take 75 mg by mouth at bedtime. 1 and 1/2 tab po daily       . nitroGLYCERIN (NITROSTAT) 0.4 MG SL tablet Place 0.4 mg under the tongue every 5 (five) minutes as needed.        Discontinued Meds:   There are no discontinued medications.  Patient Active Problem List  Diagnosis  . HYPERLIPIDEMIA  . GASTROESOPHAGEAL REFLUX DISEASE  . Hepatic steatosis  . TOBACCO ABUSE  . CHRONIC OBSTRUCTIVE PULMONARY DISEASE  . DIABETES MELLITUS-TYPE II  . Hypertension  . Anxiety and depression  . Palpitations  . Chronic low back pain  . Arteriosclerotic cardiovascular disease (ASCVD)    LABS No visits with results within 3 Month(s) from this visit. Latest known visit with results is:  Admission on 01/08/2012, Discharged on 01/08/2012  Component Date Value  . WBC 01/08/2012 8.1   . RBC 01/08/2012 4.84   .  Hemoglobin 01/08/2012 15.0   . HCT 01/08/2012 43.2   . MCV 01/08/2012 89.3   . [facility] 01/08/2012 31.0   . MCHC 01/08/2012 34.7   . RDW 01/08/2012 12.7   . Platelets 01/08/2012 200   . Sodium 01/08/2012 137   . Potassium 01/08/2012 3.7   . Chloride 01/08/2012 100   . CO2 01/08/2012 24   . Glucose, Bld 01/08/2012 153*  . BUN 01/08/2012 15   . Creatinine, Ser 01/08/2012 1.08   . Calcium 01/08/2012 10.0   . GFR calc non Af Amer 01/08/2012 80*  . GFR calc Af Amer 01/08/2012 >90   . Troponin i, poc 01/08/2012 0.00   . Comment 3 01/08/2012          . Troponin I 01/08/2012 <0.30      Results for this Opt Visit:     Results for orders placed during the hospital encounter of 01/08/12  CBC      Component Value Range   WBC 8.1  4.0 - 10.5 K/uL   RBC 4.84  4.22 - 5.81 MIL/uL   Hemoglobin 15.0  13.0 - 17.0 g/dL   HCT 16.1  09.6 - 04.5 %   MCV 89.3  78.0 - 100.0 fL   MCH 31.0  26.0 - 34.0 pg   MCHC 34.7  30.0 - 36.0 g/dL   RDW 40.9  81.1 - 91.4 %    Platelets 200  150 - 400 K/uL  BASIC METABOLIC PANEL      Component Value Range   Sodium 137  135 - 145 mEq/L   Potassium 3.7  3.5 - 5.1 mEq/L   Chloride 100  96 - 112 mEq/L   CO2 24  19 - 32 mEq/L   Glucose, Bld 153 (*) 70 - 99 mg/dL   BUN 15  6 - 23 mg/dL   Creatinine, Ser 7.82  0.50 - 1.35 mg/dL   Calcium 95.6  8.4 - 21.3 mg/dL   GFR calc non Af Amer 80 (*) >90 mL/min   GFR calc Af Amer >90  >90 mL/min  POCT I-STAT TROPONIN I      Component Value Range   Troponin i, poc 0.00  0.00 - 0.08 ng/mL   Comment 3           TROPONIN I      Component Value Range   Troponin I <0.30  <0.30 ng/mL    EKG Orders placed during the hospital encounter of 01/08/12  . EKG 12-LEAD  . EKG 12-LEAD  . EKG     Prior Assessment and Plan Problem List as of 05/27/2012            Cardiology Problems   HYPERLIPIDEMIA   Last Assessment & Plan Note   04/11/2012 Office Visit Signed 04/11/2012 12:45 PM by Kathlen Brunswick, MD    Hyperlipidemia adequately controlled when assessed a few months ago.  Current therapy will be maintained.    Hypertension   Last Assessment & Plan Note   04/11/2012 Office Visit Addendum 04/11/2012  3:36 PM by Kathlen Brunswick, MD    Blood pressure control has been good with current therapy, which will be continued.    Arteriosclerotic cardiovascular disease (ASCVD)   Last Assessment & Plan Note   04/11/2012 Office Visit Signed 04/11/2012 12:44 PM by Kathlen Brunswick, MD    Chest discomfort is not highly suggestive of myocardial ischemia.  Treatment for anxiety will be undertaken prior to considering additional cardiac testing.  Other   Palpitations   Last Assessment & Plan Note   04/11/2012 Office Visit Signed 04/11/2012 12:46 PM by Kathlen Brunswick, MD    Palpitations persist, but by description did not appear to represent a significant arrhythmia.    GASTROESOPHAGEAL REFLUX DISEASE   Last Assessment & Plan Note   02/09/2012 Office Visit Addendum 02/09/2012 10:30  AM by Joselyn Arrow, NP    Well-controlled on Nexium 40mg  BID.    Hepatic steatosis   Last Assessment & Plan Note   03/22/2011 Office Visit Signed 03/22/2011  4:40 PM by Kathlen Brunswick, MD    Hepatic disorder is managed by Dr. Lionel December.  The importance of weight loss and restriction of concentrated sweets from his diet was emphasized.    TOBACCO ABUSE   Last Assessment & Plan Note   04/11/2012 Office Visit Addendum 04/16/2012  9:51 PM by Kathlen Brunswick, MD    Patient has failed to continue to reduce tobacco use over the past few months, but is insistent that he will do so, and that he is committed to quit.  Consideration of pharmacologic assistance may be necessary in the near future.    CHRONIC OBSTRUCTIVE PULMONARY DISEASE   Last Assessment & Plan Note   04/11/2012 Office Visit Signed 04/11/2012 12:36 PM by Kathlen Brunswick, MD    Chronic lung disease is fairly mild at present.  Patient advised of the importance of discontinuing tobacco use if he wishes to maintain his lungs in fairly good condition.    DIABETES MELLITUS-TYPE II   Last Assessment & Plan Note   10/31/2011 Office Visit Signed 10/31/2011 12:48 PM by Kathlen Brunswick, MD    A1c reportedly excellent a few months ago.  Patient advised to discontinue intake of sweet beverages, continue to lose weight and to monitor his CBGs occasionally    Anxiety and depression   Last Assessment & Plan Note   04/11/2012 Office Visit Signed 04/11/2012 12:37 PM by Kathlen Brunswick, MD    Patient is aware of his anxiety, which is fairly well controlled on modest doses of alprazolam.  [facility] has a policy against prescribing benzodiazepines or narcotics.  We will arrange for assessment and treatment by Behavioral Health.     Chronic low back pain   Last Assessment & Plan Note   10/31/2011 Office Visit Signed 10/31/2011 12:51 PM by Kathlen Brunswick, MD    Back pain is the immediate problem most interfering with his daily  activities, capacity to work and [employer] of life.  I will attempt to locate a specialist I will see him in the absence of healthcare coverage.        Imaging: No results found.   FRS Calculation: Score not calculated. Missing: Total Cholesterol

## 2012-05-27 NOTE — Assessment & Plan Note (Signed)
Complete cessation of tobacco use is desirable, but patient does not feel that he can achieve this until anxiety is adequately treated.

## 2012-05-27 NOTE — Progress Notes (Signed)
Patient ID: Richard [name], male   DOB: [birth year], 46 y.o.   MRN: [id number]  HPI: Scheduled return visit for this nice young gentleman with coronary artery disease for whom I am essentially serving as primary care physician.  He continues to report anxiety, but has been unable to secure an appointment with Behavioral Health.  He is followed at the free clinic and provided with his medications.  Cigarette consumption is down to 1/3 pack per day.  He believes he will be able to quit completely once anxiety is adequately treated.  Back pain persists.  He has had some sharp localized chest pain over the left lateral rib margin with tenderness and a pleuritic component.  Right thumb was traumatized by a car door a few days ago.  He did not seek medical attention and has noted distal swelling, subungual hematoma and reduced range of motion in the distal digit since then.  Prior to Admission medications   Medication Sig Start Date End Date Taking? Authorizing Provider  acetaminophen (TYLENOL) 325 MG tablet Take 650 mg by mouth every 6 (six) hours as needed. For pain   Yes Historical Provider, MD  albuterol (VENTOLIN HFA) 108 (90 BASE) MCG/ACT inhaler Inhale 2 puffs into the lungs every 6 (six) hours as needed.   Yes Historical Provider, MD  ALPRAZolam Prudy Feeler) 0.5 MG tablet Take 0.5 mg by mouth at bedtime as needed.     Yes Historical Provider, MD  aspirin 81 MG tablet Take 81 mg by mouth at bedtime.    Yes Historical Provider, MD  cyclobenzaprine (FLEXERIL) 10 MG tablet Take 1 tablet (10 mg total) by mouth 3 (three) times daily as needed for muscle spasms. For Muscle spasms 04/21/11 06/26/12 Yes Jodelle Gross, NP  esomeprazole (NEXIUM) 40 MG capsule Take 40 mg by mouth 2 (two) times daily. 12/25/11  Yes Tiffany Kocher, PA  Fluticasone-Salmeterol (ADVAIR) 250-50 MCG/DOSE AEPB Inhale 1 puff into the lungs every 12 (twelve) hours.   Yes Historical Provider, MD  HYDROcodone-acetaminophen (VICODIN ES) 7.5-750 MG  per tablet Take 1 tablet by mouth every 6 (six) hours as needed. For back pain   Yes Historical Provider, MD  lisinopril (PRINIVIL,ZESTRIL) 20 MG tablet Take 20 mg by mouth daily.  11/11/11  Yes Kathlen Brunswick, MD  lovastatin (MEVACOR) 20 MG tablet TAKE ONE TABLET BY MOUTH EVERY DAY AT BEDTIME **REPLACES  LIPITOR** 05/16/12  Yes Jodelle Gross, NP  metFORMIN (GLUCOPHAGE) 500 MG tablet Take 500 mg by mouth 2 (two) times daily with a meal.     Yes Historical Provider, MD  metoprolol succinate (TOPROL-XL) 50 MG 24 hr tablet Take 75 mg by mouth at bedtime. 1 and 1/2 tab po daily  11/02/11  Yes Kathlen Brunswick, MD  nitroGLYCERIN (NITROSTAT) 0.4 MG SL tablet Place 0.4 mg under the tongue every 5 (five) minutes as needed.   Yes Historical Provider, MD   Allergies  Allergen Reactions  . Mushroom Ext Cmplx(Shiitake-Reishi-Mait) Anaphylaxis    Rapid heart rate.  . Penicillins     REACTION: Unknown reaction     Past medical history, social history, and family history reviewed and updated.  ROS: Denies orthopnea, edema, dyspnea or loss of consciousness.  All other systems reviewed and are negative.  PHYSICAL EXAM: BP 100/50  Pulse 68  Ht 5\' 11"  (1.803 m)  Wt 95.709 kg (211 lb)  BMI 29.43 kg/m2  General-Well developed; no acute distress Body habitus-Mildly overweight Neck-No JVD; no carotid bruits Lungs-clear  lung fields; resonant to percussion; prolonged expiratory phase Cardiovascular-normal PMI; normal S1 and S2 Abdomen-normal bowel sounds; soft and non-tender without masses or organomegaly Musculoskeletal-No deformities, no cyanosis or clubbing Neurologic-Normal cranial nerves; symmetric strength and tone Skin-Warm, no significant lesions Extremities-distal pulses intact; no edema; right thumb-nontender over the proximal phalanx with good range of motion; limited motion of the DIP joint with tenderness over the distal phalanx.  Subungual hematoma does not appear tense.  ASSESSMENT  AND PLAN:  [name], MD 05/27/2012 2:18 PM

## 2012-05-27 NOTE — Assessment & Plan Note (Signed)
Blood pressure control has been excellent with current therapy, which will be continued. 

## 2012-05-28 ENCOUNTER — Telehealth: Payer: Self-pay | Admitting: Cardiology

## 2012-05-28 NOTE — Telephone Encounter (Signed)
States that patient is congested this morning and wants to know what he can take for it that won't interact with his other meds. / tg

## 2012-05-28 NOTE — Telephone Encounter (Signed)
Medication questions. / tg

## 2012-05-28 NOTE — Telephone Encounter (Signed)
Again, discussed with wife that the pharmacist, where he gets medications filled can advise on compatibility of prescribed medications with OTC meds.  Verbalized understanding.

## 2012-05-28 NOTE — Telephone Encounter (Signed)
Advised patient to consult with pharmacist where he gets his medications filled.

## 2012-06-06 ENCOUNTER — Encounter: Payer: Self-pay | Admitting: Orthopedic Surgery

## 2012-06-06 ENCOUNTER — Ambulatory Visit (INDEPENDENT_AMBULATORY_CARE_PROVIDER_SITE_OTHER): Payer: Self-pay | Admitting: Orthopedic Surgery

## 2012-06-06 ENCOUNTER — Ambulatory Visit (INDEPENDENT_AMBULATORY_CARE_PROVIDER_SITE_OTHER): Payer: Self-pay

## 2012-06-06 VITALS — Ht 71.0 in | Wt 211.0 lb

## 2012-06-06 DIAGNOSIS — M549 Dorsalgia, unspecified: Secondary | ICD-10-CM

## 2012-06-06 DIAGNOSIS — M48062 Spinal stenosis, lumbar region with neurogenic claudication: Secondary | ICD-10-CM

## 2012-06-06 NOTE — Patient Instructions (Addendum)
Mri back   You have been scheduled for an MRI scan.   The doctor will call you with the results   Please see Dr Juanetta Gosling for pain medication   Chronic Back Pain  When back pain lasts longer than 3 months, it is called chronic back pain. People with chronic back pain often go through certain periods that are more intense (flare-ups).   CAUSES Chronic back pain can be caused by wear and tear (degeneration) on different structures in your back. These structures include:  The bones of your spine (vertebrae) and the joints surrounding your spinal cord and nerve roots (facets).   The strong, fibrous tissues that connect your vertebrae (ligaments).  Degeneration of these structures may result in pressure on your nerves. This can lead to constant pain. HOME CARE INSTRUCTIONS  Avoid bending, heavy lifting, prolonged sitting, and activities which make the problem worse.   Take brief periods of rest throughout the day to reduce your pain. Lying down or standing usually is better than sitting while you are resting.   Take over-the-counter or prescription medicines only as directed by your caregiver.  SEEK IMMEDIATE MEDICAL CARE IF:    You have weakness or numbness in one of your legs or feet.   You have trouble controlling your bladder or bowels.   You have nausea, vomiting, abdominal pain, shortness of breath, or fainting.  Document Released: 08/10/2004 Document Revised: 09/25/2011 Document Reviewed: 06/17/2011 [facility] Patient Information 2013 [place], [place].   Spinal Stenosis One cause of back pain is spinal stenosis. Stenosis means abnormal narrowing. The spinal canal contains and protects the spinal nerve roots. In spinal stenosis, the spinal canal narrows and pinches the spinal cord and nerves. This causes low back pain and pain in the legs. Stenosis may pinch the nerves that control muscles and sensation in the legs. This leads to pain and abnormal feelings in the leg muscles and areas  supplied by those nerves. CAUSES   Spinal stenosis often happens to people as they get older and arthritic boney growths occur in their spinal canal. There is also a loss of the disk height between the bones of the back, which also adds to this problem. Sometimes the problem is present at birth. SYMPTOMS    Pain that is generally worse with activities, particularly standing and walking.   Numbness, tingling, hot or cold feelings, weakness, or a weariness in the legs.   Clumsiness, frequent falling, and a foot-slapping gait, which may come as a result of nerve pressure and muscle weakness.  DIAGNOSIS    Your caregiver may suspect spinal stenosis if you have unusual leg symptoms, such as those previously mentioned.   Your orthopedic surgeon may request special imaging exams, such a computerized magnetic scan (MRI) or computerized X-ray scan (CT) to find out the cause of the problem.  TREATMENT    Sometimes treatments such as postural changes or nonsteroidal anti-inflammatory drugs will relieve the pain.   Nonsteroidal anti-inflammatory medications may help relieve symptoms. These medicines do this by decreasing swelling and inflammation in the nerves.   When stenosis causes severe nerve root compression, conservative treatment may not be enough to maintain a normal lifestyle. Surgery may be recommended to relieve the pressure on affected nerves. In properly selected patients, the results are very good, and patients are able to continue a normal lifestyle.  HOME CARE INSTRUCTIONS    Flexing the spine by leaning forward while walking may relieve symptoms. Lying with the knees drawn up to  the chest may offer some relief. These positions enlarge the space available to the nerves. They may make it easier for stenosis sufferers to walk longer distances.   Rest, followed by gradually resuming activity, also can help.   Aerobic activity, such as bicycling or swimming, is often recommended.    Losing weight can also relieve some of the load on the spine.   Application of warm or cold compresses to the area of pain can be helpful.  SEEK MEDICAL CARE IF:    The periods of relief between episodes of pain become shorter and shorter.   You experience pain that radiates down your leg, even when you are not standing or walking.  SEEK IMMEDIATE MEDICAL CARE IF:    You have a loss of bowel or bladder control.   You have a sudden loss of feeling in your legs.   You suddenly cannot move your legs.  Document Released: 09/23/2003 Document Revised: 09/25/2011 Document Reviewed: 11/18/2009 [facility] Patient Information 2013 [place], [place].

## 2012-06-06 NOTE — Progress Notes (Signed)
Patient ID: Richard [name], male   DOB: [birth year], 46 y.o.   MRN: [id number] Chief Complaint  Patient presents with  . Back Pain    Back pain, no injury.    Referral from Dr. Dietrich Pates  Today we have a 46 year old male, who started having back pain in 1991 after a fall at work. He was treated by a chiropractor, which include manipulation and therapy and was started on Lortab eventually switched over to Vicodin for chronic back pain. Until about 4 years ago. He was able to obtain gainful employment.  He now complains of sharp, dull, throbbing, stabbing, burning constant, 7/10. Her back pain associated with LEFT lower extremity radiating pain down to his knee occasionally to his foot with locking and catching of his lower back.  In terms of activities. He will have difficulty getting up from a bent position or from a flexed position in terms of the spine.  He complains of blurred vision, occasional chest pain, with palpitations. Shortness of breath, wheezing, cough, tightness of the chest, pain on inspiration and snoring. Heartburn, frequency, poor healing, numbness, tingling, nervousness, anxiety, depression, temperature intolerance. He is allergic to mushrooms, and penicillin, has adverse reactions to certain foods.  His medical history Past Medical History  Diagnosis Date  . Chest pain     + palpitations; cath 2005- 30-40% mid LAD, 20% D1, 20% cx, OM, 20-30% RCA, and EF-55%  . COPD (chronic obstructive pulmonary disease)   . GERD (gastroesophageal reflux disease)   . Hyperlipemia   . Hypertension   . Depression   . Colitis 1990  . Tobacco abuse     1/2 pack per day  . Diabetes mellitus   . Gastric ulcer 2003; 2012    2003: + esophagitis; negative H.pylori serology  2012: Dr. Darrick Penna, mild gastritis, Bravo PH probe placement, negative H.pylori  . Hepatic steatosis   . Chronic low back pain    Past Surgical History  Procedure Date  . Colonoscopy 1990  . Shoulder surgery     Right  acromioclavicular joint arthrosis  . Neck mass excision   . Cardiac catheterization 2005  . Bravo ph study 05/03/2011    Mild gastritis/normal esophagus and duodenum    Vital signs are stable as recorded  General appearance is normal  The patient is alert and oriented x3  The patient's mood and affect are normal  Gait assessment: No abnormalities The cardiovascular exam reveals normal pulses and temperature without edema swelling.  The lymphatic system is negative for palpable lymph nodes  The sensory exam is normal.  There are no pathologic reflexes.  Balance is normal.   Exam of the Spine standing Inspection reveals normal hip, height and shoulder height. He can reach down to approximately mid tibial level and then has to flex his knees to touch. His toes. He has painful flexion of the spine and painful extension  The lower extremities have normal range of motion, normal strength, all joints reduced without subluxation, muscle tone normal.  Reflexes are normal. Straight leg raises are negative.  Palpation of the spine He is tender in the lumbar region throughout the entire lumbar area , as well as the sacroiliac joints and LEFT gluteal area  Skin Normal lumbar scan, normal lower extremities  Impression His x-rays do not show any major abnormalities. He may have some mild kyphos deformity at the thoracolumbar area with no evidence of fracture or tumor

## 2012-06-12 ENCOUNTER — Other Ambulatory Visit: Payer: Self-pay | Admitting: Cardiology

## 2012-06-18 ENCOUNTER — Telehealth: Payer: Self-pay | Admitting: Radiology

## 2012-06-18 ENCOUNTER — Other Ambulatory Visit: Payer: Self-pay | Admitting: Cardiology

## 2012-06-18 MED ORDER — LISINOPRIL 20 MG PO TABS: 20.0000 mg | ORAL_TABLET | Freq: Every day | ORAL | Status: DC

## 2012-06-18 NOTE — Telephone Encounter (Signed)
Patient is aware of MRI appointment at [facility] on 06-21-12 at 8:30 pm. Patient has the [facility] discount. Patient does not need to follow up back in our office.

## 2012-06-21 ENCOUNTER — Other Ambulatory Visit: Payer: Self-pay

## 2012-07-03 ENCOUNTER — Ambulatory Visit
Admission: RE | Admit: 2012-07-03 | Discharge: 2012-07-03 | Disposition: A | Discharge status: Home / Self Care | Payer: No Typology Code available for payment source | Source: Ambulatory Visit | Attending: Orthopedic Surgery | Admitting: Orthopedic Surgery

## 2012-07-03 ENCOUNTER — Ambulatory Visit (INDEPENDENT_AMBULATORY_CARE_PROVIDER_SITE_OTHER): Payer: Self-pay | Admitting: Psychology

## 2012-07-03 DIAGNOSIS — F411 Generalized anxiety disorder: Secondary | ICD-10-CM

## 2012-07-03 DIAGNOSIS — F419 Anxiety disorder, unspecified: Secondary | ICD-10-CM

## 2012-07-03 DIAGNOSIS — F410 Panic disorder [episodic paroxysmal anxiety] without agoraphobia: Secondary | ICD-10-CM

## 2012-07-03 DIAGNOSIS — M48062 Spinal stenosis, lumbar region with neurogenic claudication: Secondary | ICD-10-CM

## 2012-07-04 ENCOUNTER — Encounter (HOSPITAL_COMMUNITY): Payer: Self-pay | Admitting: Psychology

## 2012-07-04 NOTE — Progress Notes (Signed)
Patient:   Richard [name]   DOB:   Jul 01, 1966  MR Number:  [id number]  Location:  [facility] [address] [place] [address] Dept: [phone]           Date of Service:   07/03/2012  Start Time:   3 PM End Time:   4 PM  Provider/Observer:  Hershal Coria PSYD       Billing Code/Service: [id number]  Chief Complaint:     Chief Complaint  Patient presents with  . Anxiety  . Panic Attack    Reason for Service:  The patient was referred by Dr. Dietrich Pates because of severe anxiety and foot appear to be panic attacks. The patient reports that he has been having a lot of anxiety and panic attacks. He has numerous medical issues including severe issues with diabetes, COPD, fatty liver disease and orthopedic issues. He also has had a long history of depression anxiety as well. Anxiety and depression have been going on for least 5 years and correlate with his health issues and financial difficulties. The patient relates this deterioration to an injury at work where he injured both of his shoulders while pulling a pallet. He had surgery on one of his shoulders initially and he experiences around his injury and surgery began to develop panic attacks. The patient is also been diagnosed with COPD and diabetes but continues to smoke and fears that if he completely quit smoking that he will gain a lot of weight again.  Current Status:  The patient is not doing very well and is experiencing a lot of anxiety and panic attacks as well as depression. Major stressors are going on in his life due to severe health issues. However, he is drinking an excessive amount of [facility] each day and is clearly dealing with the caffeine addiction. He also smokes everyday even though he has COPD. At this point, his physicians are being concerned about continuing to give him Xanax for his panic attacks as he has so many other  medical issues and is not doing much to improve his function.  Reliability of Information: Information was provided by the patient as well as his treating psychiatrist.  Behavioral Observation: Richard [name]  presents as a 46 y.o.-year-old Right Caucasian Male who appeared his stated age. his dress was Appropriate and he was Well Groomed and his manners were Appropriate to the situation.  There were not any physical disabilities noted.  he displayed an appropriate level of cooperation and motivation.    Interactions:    Active   Attention:   within normal limits  Memory:   within normal limits  Visuo-spatial:   within normal limits  Speech (Volume):  normal  Speech:   normal pitch and normal volume  Thought Process:  Coherent  Though Content:  WNL  Orientation:   person, place, time/date and situation  Judgment:   Poor  Planning:   Poor  Affect:    Anxious  Mood:    Anxious  Insight:   Lacking  Intelligence:   normal  Marital Status/Living: The patient was born and raised in [place] and grew up in 2 sisters and 6 brothers. He is married to his second wife and has 2 sons and 3 daughters all of whom are 19 years or older. He continues to live with his wife in temporarily with his daughter and 2 grandsons and her daughters fiance.  Current Employment: The patient is not working and is disabled because of numerous medical issues, shoulder injury, and other issues.  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  Substance Use:  No concerns of substance abuse are reported.  however, the patient drinks an excessive amount of [facility] every day and reports that on the lowest day he'll have 6 or 7 "cans" of [facility] and there are times in the past when he would drink as much as a case of [facility].  this is of great concern considering his  diabetes and his panic attacks which may likely or or almost assuredly worsened by excessive in extreme amounts of caffeine. The patient also smokes continually and has been diagnosed with COPD.  Education:   HS Graduate  Medical History:   Past Medical History  Diagnosis Date  . Chest pain     + palpitations; cath 2005- 30-40% mid LAD, 20% D1, 20% cx, OM, 20-30% RCA, and EF-55%  . COPD (chronic obstructive pulmonary disease)   . GERD (gastroesophageal reflux disease)   . Hyperlipemia   . Hypertension   . Depression   . Colitis 1990  . Tobacco abuse     1/2 pack per day  . Diabetes mellitus   . Gastric ulcer 2003; 2012    2003: + esophagitis; negative H.pylori serology  2012: Dr. Darrick Penna, mild gastritis, Bravo PH probe placement, negative H.pylori  . Hepatic steatosis   . Chronic low back pain         Outpatient Encounter Prescriptions as of 07/03/2012  Medication Sig Dispense Refill  . acetaminophen (TYLENOL) 325 MG tablet Take 650 mg by mouth every 6 (six) hours as needed. For pain      . albuterol (VENTOLIN HFA) 108 (90 BASE) MCG/ACT inhaler Inhale 2 puffs into the lungs every 6 (six) hours as needed.      . ALPRAZolam (XANAX) 0.5 MG tablet Take 0.5 mg by mouth at bedtime as needed.        [name] aspirin 81 MG tablet Take 81 mg by mouth at bedtime.       [name] esomeprazole (NEXIUM) 40 MG capsule Take 40 mg by mouth 2 (two) times daily.      . Fluticasone-Salmeterol (ADVAIR) 250-50 MCG/DOSE AEPB Inhale 1 puff into the lungs every 12 (twelve) hours.      [name] HYDROcodone-acetaminophen (VICODIN ES) 7.5-750 MG per tablet Take 1 tablet by mouth every 6 (six) hours as needed. For back pain      . lisinopril (PRINIVIL,ZESTRIL) 20 MG tablet Take 20 mg by mouth daily.       [name] lisinopril (PRINIVIL,ZESTRIL) 20 MG tablet Take 1 tablet (20 mg total) by mouth daily.  30 tablet  5  . lovastatin (MEVACOR) 20 MG tablet TAKE ONE TABLET BY MOUTH EVERY DAY AT BEDTIME **REPLACES  LIPITOR**  30 tablet  5  .  metFORMIN (GLUCOPHAGE) 500 MG tablet Take 500 mg by mouth 2 (two) times daily with a meal.        . metoprolol succinate (TOPROL-XL) 50 MG 24 hr tablet Take 75 mg by mouth at bedtime. 1 and 1/2 tab po daily       . nitroGLYCERIN (NITROSTAT) 0.4 MG SL tablet Place 0.4 mg under the tongue every 5 (five) minutes as needed.  Sexual History:   History  Sexual Activity  . Sexually Active: Not on file    Abuse/Trauma History:  the patient denies any history of abuse or trauma.   Psychiatric History:   the patient denies any prior psychiatric history even though he has been taking Xanax for quite some time for anxiety. I do think that he is dealing with significant panic attacks.   Family Med/Psych History:  Family History  Problem Relation Age of Onset  . Lung cancer Mother   . Heart attack Father 29  . Diabetes Father   . Hypertension Brother   . Colon cancer Neg Hx   . Hypertension Brother   . Heart attack Brother 31    Risk of Suicide/Violence: virtually non-existent   Impression/DX:   the patient appears to be doing with panic attacks along with anxiety and depression. He has numerous medical issues including COPD, diabetes, and other issues as well as liver disease. The patient is not taking very good care of himself and it was stressed specifically that he is going to need to quit smoking cigarettes and completely stopped caffeinated and sugar drinks if he is going to have any hope of improving his quality of life. The patient was amenable to initially try to stop the caffeinated drinks and we developed a plan for this. After that we will begin to address the smoking.   Disposition/Plan:   we'll set the patient up for psychotherapeutic interventions as well as a consultation with psychiatrist.  Diagnosis:    Axis I:   1. Anxiety   2. Panic attacks         Axis II: Deferred       Axis III:      Axis IV:  other psychosocial or environmental  problems          Axis V:  51-60 moderate symptoms

## 2012-07-23 ENCOUNTER — Ambulatory Visit: Payer: Self-pay | Admitting: Orthopedic Surgery

## 2012-07-24 ENCOUNTER — Ambulatory Visit (HOSPITAL_COMMUNITY): Payer: Self-pay | Admitting: Psychology

## 2012-07-30 ENCOUNTER — Ambulatory Visit (INDEPENDENT_AMBULATORY_CARE_PROVIDER_SITE_OTHER): Payer: Self-pay | Admitting: Psychology

## 2012-07-30 DIAGNOSIS — F410 Panic disorder [episodic paroxysmal anxiety] without agoraphobia: Secondary | ICD-10-CM

## 2012-07-30 DIAGNOSIS — F419 Anxiety disorder, unspecified: Secondary | ICD-10-CM

## 2012-07-30 DIAGNOSIS — F411 Generalized anxiety disorder: Secondary | ICD-10-CM

## 2012-08-01 ENCOUNTER — Encounter (HOSPITAL_COMMUNITY): Payer: Self-pay | Admitting: Psychology

## 2012-08-01 ENCOUNTER — Ambulatory Visit (INDEPENDENT_AMBULATORY_CARE_PROVIDER_SITE_OTHER): Payer: Self-pay | Admitting: Orthopedic Surgery

## 2012-08-01 VITALS — Ht 71.0 in | Wt 211.0 lb

## 2012-08-01 DIAGNOSIS — M5136 Other intervertebral disc degeneration, lumbar region: Secondary | ICD-10-CM

## 2012-08-01 DIAGNOSIS — M5137 Other intervertebral disc degeneration, lumbosacral region: Secondary | ICD-10-CM

## 2012-08-01 DIAGNOSIS — M545 Low back pain: Secondary | ICD-10-CM

## 2012-08-01 DIAGNOSIS — M79605 Pain in left leg: Secondary | ICD-10-CM

## 2012-08-01 NOTE — Progress Notes (Signed)
Patient:  Richard [name]   DOB: 10-01-1965  MR Number: [id number]  Location: [facility] [address] [place] [address] Dept: [phone]  Start: 2 PM End: 3 PM  Provider/Observer:     Hershal Coria PSYD  Chief Complaint:      Chief Complaint  Patient presents with  . Anxiety  . Panic Attack    Reason For Service:    The patient was referred by Dr. Dietrich Pates because of severe anxiety and foot appear to be panic attacks. The patient reports that he has been having a lot of anxiety and panic attacks. He has numerous medical issues including severe issues with diabetes, COPD, fatty liver disease and orthopedic issues. He also has had a long history of depression anxiety as well. Anxiety and depression have been going on for least 5 years and correlate with his health issues and financial difficulties. The patient relates this deterioration to an injury at work where he injured both of his shoulders while pulling a pallet. He had surgery on one of his shoulders initially and he experiences around his injury and surgery began to develop panic attacks. The patient is also been diagnosed with COPD and diabetes but continues to smoke and fears that if he completely quit smoking that he will gain a lot of weight again.   Interventions Strategy:  Cognitive/behavioral psychotherapeutic interventions  Participation Level:   Active  Participation Quality:  Appropriate      Behavioral Observation:  Fairly Groomed, Alert, and Appropriate.   Current Psychosocial Factors: The patient reports that he is very stressed about a number of family issues including how his help is interacting with the situation that he is dealing with.  Content of Session:   Review current symptoms and continued work on therapeutic interventions around numerous medical issues and panic attacks.  Current Status:   The patient  reports he is continuing to experience panic attacks and while he vehemently reports that he would like to stop any benzodiazepine and he regularly minimizes, down place, or rejects options that may work for him over the long-term would be difficult for the short-term.  The patient is also significantly reduced the amount of caffeinated drinks that he is drinking any reports that this did not fix everything but he is 5000 sodium is feeling better.  Patient Progress:   Stable  Target Goals:   Target goals include reduce the intensity, duration, and frequency of panic attacks, reduce the amount of benzodiazepine or other potentially dependence forming medications.  Last Reviewed:   08/01/2012  Goals Addressed Today:    Today we worked on issues of reducing the intensity and frequency of panic attacks  Impression/Diagnosis:   the patient appears to be doing with panic attacks along with anxiety and depression. He has numerous medical issues including COPD, diabetes, and other issues as well as liver disease. The patient is not taking very good care of himself and it was stressed specifically that he is going to need to quit smoking cigarettes and completely stopped caffeinated and sugar drinks if he is going to have any hope of improving his quality of life. The patient was amenable to initially try to stop the caffeinated drinks and we developed a plan for this. After that we will begin to address the smoking.    Diagnosis:    Axis I:  1. Anxiety   2. Panic attacks  Axis II: No diagnosis

## 2012-08-01 NOTE — Progress Notes (Signed)
Patient ID: Richard [name], male   DOB: Aug 23, 1965, 47 y.o.   MRN: [id number] Chief Complaint  Patient presents with  . Follow-up    MRI results     The patient has had an MRI of his back for persistent left lower chest remedy radicular pain. His MRI came back essentially with a mild bulging disc and some arthritis.  This can be managed with Tylenol or anti-inflammatories I think he should repeat a course of physical therapy but other than that no further treatment is necessary at this time. If he does continue to have problems he should see chronic pain management or neurosurgeon.

## 2012-08-01 NOTE — Patient Instructions (Addendum)
Call hospital to arrange PT  

## 2012-08-08 ENCOUNTER — Ambulatory Visit (HOSPITAL_COMMUNITY)
Admission: RE | Admit: 2012-08-08 | Discharge: 2012-08-08 | Disposition: A | Discharge status: Home / Self Care | Payer: Self-pay | Source: Ambulatory Visit | Attending: Orthopedic Surgery | Admitting: Orthopedic Surgery

## 2012-08-08 DIAGNOSIS — M545 Low back pain, unspecified: Secondary | ICD-10-CM | POA: Insufficient documentation

## 2012-08-08 DIAGNOSIS — M6281 Muscle weakness (generalized): Secondary | ICD-10-CM | POA: Insufficient documentation

## 2012-08-08 DIAGNOSIS — IMO0001 Reserved for inherently not codable concepts without codable children: Secondary | ICD-10-CM | POA: Insufficient documentation

## 2012-08-12 ENCOUNTER — Ambulatory Visit (HOSPITAL_COMMUNITY): Payer: Self-pay | Admitting: Physical Therapy

## 2012-08-13 ENCOUNTER — Ambulatory Visit (HOSPITAL_COMMUNITY): Payer: Self-pay | Admitting: Psychology

## 2012-08-14 ENCOUNTER — Ambulatory Visit (HOSPITAL_COMMUNITY): Payer: Self-pay

## 2012-08-16 ENCOUNTER — Inpatient Hospital Stay (HOSPITAL_COMMUNITY): Admission: RE | Admit: 2012-08-16 | Payer: Self-pay | Source: Ambulatory Visit | Admitting: Physical Therapy

## 2012-08-19 ENCOUNTER — Ambulatory Visit (HOSPITAL_COMMUNITY): Payer: Self-pay | Admitting: Physical Therapy

## 2012-08-20 ENCOUNTER — Encounter (HOSPITAL_COMMUNITY): Payer: Self-pay | Admitting: Psychiatry

## 2012-08-20 ENCOUNTER — Ambulatory Visit (INDEPENDENT_AMBULATORY_CARE_PROVIDER_SITE_OTHER): Payer: Self-pay | Admitting: Psychiatry

## 2012-08-20 VITALS — Wt 209.0 lb

## 2012-08-20 DIAGNOSIS — E559 Vitamin D deficiency, unspecified: Secondary | ICD-10-CM | POA: Insufficient documentation

## 2012-08-20 DIAGNOSIS — M545 Low back pain: Secondary | ICD-10-CM

## 2012-08-20 DIAGNOSIS — F17200 Nicotine dependence, unspecified, uncomplicated: Secondary | ICD-10-CM

## 2012-08-20 DIAGNOSIS — F329 Major depressive disorder, single episode, unspecified: Secondary | ICD-10-CM

## 2012-08-20 DIAGNOSIS — F411 Generalized anxiety disorder: Secondary | ICD-10-CM

## 2012-08-20 DIAGNOSIS — F332 Major depressive disorder, recurrent severe without psychotic features: Secondary | ICD-10-CM

## 2012-08-20 DIAGNOSIS — F410 Panic disorder [episodic paroxysmal anxiety] without agoraphobia: Secondary | ICD-10-CM

## 2012-08-20 MED ORDER — ANALGESIC GRX BALM EX OINT: 1.0000 g | TOPICAL_OINTMENT | Freq: Four times a day (QID) | CUTANEOUS | Status: DC

## 2012-08-20 MED ORDER — GABAPENTIN 100 MG PO CAPS: 100.0000 mg | ORAL_CAPSULE | Freq: Three times a day (TID) | ORAL | Status: DC

## 2012-08-20 MED ORDER — PROPRANOLOL HCL 10 MG PO TABS: 10.0000 mg | ORAL_TABLET | Freq: Three times a day (TID) | ORAL | Status: DC

## 2012-08-20 MED ORDER — DULOXETINE HCL 30 MG PO CPEP: 30.0000 mg | ORAL_CAPSULE | Freq: Every day | ORAL | Status: DC

## 2012-08-20 NOTE — Patient Instructions (Signed)
Call physician and ask how to substitute for the Toprol with the Inderal for anxiety.  Call if problems or concerns.  Relaxation is the ultimate solution for you.  You can seek it through tub baths, bubble baths, essential oils or incense, walking or chatting with friends, listening to soft music, watching a candle burn and just letting all thoughts go and appreciating the true essence of the Creator.
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
## 2012-08-21 ENCOUNTER — Ambulatory Visit (HOSPITAL_COMMUNITY)
Admission: RE | Admit: 2012-08-21 | Discharge: 2012-08-21 | Disposition: A | Discharge status: Home / Self Care | Payer: Self-pay | Source: Ambulatory Visit | Attending: Orthopedic Surgery | Admitting: Orthopedic Surgery

## 2012-08-21 DIAGNOSIS — M545 Low back pain, unspecified: Secondary | ICD-10-CM | POA: Insufficient documentation

## 2012-08-21 DIAGNOSIS — IMO0001 Reserved for inherently not codable concepts without codable children: Secondary | ICD-10-CM | POA: Insufficient documentation

## 2012-08-21 DIAGNOSIS — M6281 Muscle weakness (generalized): Secondary | ICD-10-CM | POA: Insufficient documentation

## 2012-08-21 NOTE — Progress Notes (Signed)
Physical Therapy Treatment Patient Details  Name: Richard [name] MRN: [id number] Date of Birth: 06-29-1966  Today's Date: 08/21/2012 Time: 1020-1115 PT Time Calculation (min): 55 min Charges: 20' TE, 25' NMR, 1 heat Visit#: 2  of 12   Re-eval: 09/07/12    Authorization: [facility] discount until 09/11/12  Authorization Time Period:    Authorization Visit#:   of     Subjective: Symptoms/Limitations Symptoms: Pt reports that he was ill for the past week and is feeling better now.  States that his pain is down a little and has been doing the exercises at home.  Has not taken pain medication today.  Pain Assessment Pain Score:   5 Pain Location: Back  Exercise/Treatments Stretches Active Hamstring Stretch: 3 reps;30 seconds (BLE) Lower Trunk Rotation: 5 reps;10 seconds Standing Extension:  (10 reps) Prone on Elbows Stretch: 3 reps;30 seconds Press Ups: Limitations Press Ups Limitations: 2x10 w/manual overpressure to L2 SP Seated Other Seated Lumbar Exercises: Heel roll outs 5x10 sec holds Supine Bridge: 10 reps Prone  Other Prone Lumbar Exercises: hip ER 10x10 sec holds BLE Other Prone Lumbar Exercises: anterior tilt 10x10 sec holds  Modalities Modalities: Moist Heat Manual Therapy Manual Therapy: Joint mobilization Joint Mobilization: Grade I-IV to L2-4 SP w/STM to lumbar-lower thoracic erector spinae after to decrease pain.  Moist Heat Therapy Number Minutes Moist Heat: 10 Minutes Moist Heat Location:  (Back)  Physical Therapy Assessment and Plan PT Assessment and Plan Clinical Impression Statement: Pt with significant improvement in multifidus strength and coordination has difficulty with PF coordination.  Has improved hip ER.  Pt is limited by L2 SP dysfunction which improved with manual techniques.  Applied heat at end of session to decrease discomfort after manual techniques and exercise.  PT Plan: No modalities.  f/u on manual techniques.  Continue with stretching and  lumbar stabalization to decrease pain.  Add t-band next visit.     Goals    Problem List Patient Active Problem List  Diagnosis  . HYPERLIPIDEMIA  . GASTROESOPHAGEAL REFLUX DISEASE  . Hepatic steatosis  . TOBACCO ABUSE  . CHRONIC OBSTRUCTIVE PULMONARY DISEASE  . DIABETES MELLITUS-TYPE II  . Hypertension  . Anxiety and depression  . Palpitations  . Chronic low back pain  . Arteriosclerotic cardiovascular disease (ASCVD)  . Spinal stenosis of lumbar region with neurogenic claudication  . Unspecified vitamin D deficiency    PT Plan of Care PT Patient Instructions: discussed importance of posture.  Consulted and Agree with Plan of Care: Patient  Annett Fabian, PT 08/21/2012, 12:05 PM

## 2012-08-22 NOTE — Evaluation (Signed)
Physical Therapy Evaluation  Patient Details  Name: Richard [name] MRN: [id number] Date of Birth: 02/27/1966  Today's Date: 08/22/2012 Time: 1110-1200 PT Time Calculation (min): 50 min Charges: 1 eval, 10' TE Visit#: 1  of 12   Re-eval: 09/07/12 Assessment Diagnosis: LBP w/Radicular symptoms to LLE Next MD Visit: Dr. Romeo Apple - unscheduled Prior Therapy: 3-4 years ago [facility]  Authorization: [facility] discount until 09/11/12  Authorization Time Period:    Authorization Visit#:   of     Past Medical History:  Past Medical History  Diagnosis Date  . Chest pain     + palpitations; cath 2005- 30-40% mid LAD, 20% D1, 20% cx, OM, 20-30% RCA, and EF-55%  . COPD (chronic obstructive pulmonary disease)   . GERD (gastroesophageal reflux disease)   . Hyperlipemia   . Hypertension   . Depression   . Colitis 1990  . Tobacco abuse     1/2 pack per day  . Diabetes mellitus   . Gastric ulcer 2003; 2012    2003: + esophagitis; negative H.pylori serology  2012: Dr. Darrick Penna, mild gastritis, Bravo PH probe placement, negative H.pylori  . Hepatic steatosis   . Chronic low back pain    Past Surgical History:  Past Surgical History  Procedure Date  . Colonoscopy 1990  . Shoulder surgery     Right acromioclavicular joint arthrosis  . Neck mass excision   . Cardiac catheterization 2005  . Bravo ph study 05/03/2011    Mild gastritis/normal esophagus and duodenum   Subjective Symptoms/Limitations Symptoms: PMH: anxiety, COPD,  Pertinent History: Pt is referred to PT for LBP. He states that he has had pain for about 20 years or so.  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 He has had an MRI which essentially showed mild bulging disc.  He is attempting to make lifestyle changes to improve his life (cutting back smoking, cutting back on sodas.) Has attempted to get a job, but is unable to because of  the amount of pain medication that he is on.  How long can you sit comfortably?: 10 minutes How long can you stand comfortably?: as long as he can shift weight  How long can you walk comfortably?: limited by COPD 5-10 minutes Patient Stated Goals: "I want to be able to go shopping at [employer] with my wife.  I want to be able to play with my five grandchildren." Pain Assessment Pain Score:   3 Pain Location: Back Pain Type: Acute pain;Chronic pain  Precautions/Restrictions  Precautions Precaution Comments: Hx of Cancer  Prior Functioning  Home Living Lives With: Spouse Prior Function Vocation: Unemployed Vocation Requirements: looking for a job that he can do.  Attempting to stop his pain medication in order to work.  Comments: He enjoys being with his family and taking care of his grandchildren  Cognition/Observation Observation/Other Assessments Observations: Gower sign on return to stand from flexion.  Other Assessments: B LE: quad and achilles reflexes +1; impaired LLE toe walking, heel walking WNL  Sensation/Coordination/Flexibility/Functional Tests Coordination Coordination and Movement Description: impaired to multifidus, independent with TrA and PF strengthening Flexibility Thomas: Positive Obers: Positive 90/90: Positive Functional Tests Functional Tests: - Slump Test, - L SLR,  Functional Tests: ODI: 70%  Assessment RLE AROM (degrees) RLE Overall AROM Comments: Hip ER: 10 degrees RLE Strength RLE Overall Strength Comments: 5/5 throughout LLE AROM (degrees) LLE Overall AROM  Comments: Hip ER: 17 LLE Strength LLE Overall Strength Comments: 5/5 throughout Lumbar AROM Lumbar Flexion: decreased 50% - most painful pain Lumbar Extension: decreased 25% Lumbar - Right Side Bend: decreased 1-% - pain Lumbar - Left Side Bend: decreased - 10% Palpation Palpation: significant fascial restriction and muscle tightness throughout lumbar erector spinae and B gluteal region.     Mobility/Balance  Ambulation/Gait Ambulation/Gait: Yes Gait Pattern: Antalgic;Decreased step length - left Posture/Postural Control Posture/Postural Control: Postural limitations Postural Limitations: ridgid posture, decreased lordosis Static Standing Balance Static Standing - Comment/# of Minutes: WNL   Exercise/Treatments Stretches Active Hamstring Stretch: 1 rep;30 seconds Seated Other Seated Lumbar Exercises: Heel roll outs 3x10 sec holds Supine Ab Set: 5 reps;Limitations AB Set Limitations: 10 sec holds Other Supine Lumbar Exercises: PF 5x10 sec holds Prone  Other Prone Lumbar Exercises: hip ER x10 reps BLE Other Prone Lumbar Exercises: anterior tilt 5x10 sec holds    Physical Therapy Assessment and Plan PT Assessment and Plan Clinical Impression Statement: Pt is a 47 year old male referred to PT for LBP that he has been dealing with and has had to take pain medication and has caused him to loose his job.  He is now seeking counseuling and making healthy lifestyle changes in order to return to work.  After evaluation it is likely that he is limited by his impaired hamstring and hip flexiibility.  PT Plan: No modalities secondary to cancer.  Continue to improve core stability.     Goals Home Exercise Program Pt will Perform Home Exercise Program: Independently PT Goal: Perform Home Exercise Program - Progress: Goal set today PT Short Term Goals Time to Complete Short Term Goals: 3 weeks PT Short Term Goal 1: Pt will report pain less than 3/10 for 75% of his day for improved QOL.  PT Short Term Goal 2: Pt will improve his hip ER AROM to 25 degress to decrease low back pain.  PT Short Term Goal 3: Pt will improve his L leg SLR to 80 degrees for improved LE flexibility. PT Short Term Goal 4: Pt will improve his lumbar ROM to WNL without reports of pain at end range for improved QOL.  PT Long Term Goals Time to Complete Long Term Goals: Other (comment) (6 weeks) PT Long  Term Goal 1: Pt will improve his ODI to less than 50% to improve QOL.  PT Long Term Goal 2: Pt will present with decreaed fascial restrictions to lumbar erector spinae and gluteal region for improved lumbar mobility.  Long Term Goal 3: Pt will improve core strength to [facility] in order to tolerate sitting for greater than 1 hour to enjoy playing with his grandchildren.  Long Term Goal 4: Pt will improve postural strength in order to tolerate standing and walking for greater than 45 minutes (as allowed by his COPD) in order to go shopping with his wife.   Problem List Patient Active Problem List  Diagnosis  . HYPERLIPIDEMIA  . GASTROESOPHAGEAL REFLUX DISEASE  . Hepatic steatosis  . TOBACCO ABUSE  . CHRONIC OBSTRUCTIVE PULMONARY DISEASE  . DIABETES MELLITUS-TYPE II  . Hypertension  . Anxiety and depression  . Palpitations  . Chronic low back pain  . Arteriosclerotic cardiovascular disease (ASCVD)  . Spinal stenosis of lumbar region with neurogenic claudication  . Unspecified vitamin D deficiency    PT Plan of Care PT Home Exercise Plan: see scanned report (core stabilization activities) PT Patient Instructions: discussed importance of posture.  Consulted and Agree with  Plan of Care: Patient  Annett Fabian, PT 08/22/2012, 12:06 PM  Physician Documentation Your signature is required to indicate approval of the treatment plan as stated above.  Please sign and either send electronically or make a copy of this report for your files and return this physician signed original.   Please mark one 1.__approve of plan  2. ___approve of plan with the following conditions.   ______________________________                                                          _____________________ Physician Signature                                                                                                             Date

## 2012-08-23 ENCOUNTER — Ambulatory Visit (HOSPITAL_COMMUNITY)
Admission: RE | Admit: 2012-08-23 | Discharge: 2012-08-23 | Disposition: A | Discharge status: Home / Self Care | Payer: Self-pay | Source: Ambulatory Visit | Attending: Orthopedic Surgery | Admitting: Orthopedic Surgery

## 2012-08-23 NOTE — Progress Notes (Signed)
Physical Therapy Treatment Patient Details  Name: Richard [name] MRN: [id number] Date of Birth: [birth year]  Today's Date: 08/23/2012 Time: 1478-2956 PT Time Calculation (min): 49 min Charges: 14' TE, 25' Manual, 1 HP Visit#: 3  of 12   Re-eval: 09/07/12    Authorization: [facility] discount until 09/11/12  Authorization Time Period:    Authorization Visit#:   of     Subjective: Symptoms/Limitations Symptoms: Pt reports that he is doing pretty well with his exercises.  Today is a better day.   Exercise/Treatments Stretches Lower Trunk Rotation: 3 reps;10 seconds Hip Flexor Stretch: 2 reps;30 seconds (BLE; Supine Thomas stretch) Quad Stretch: 2 reps;30 seconds;Limitations Lobbyist Limitations: manual Piriformis Stretch: 2 reps;30 seconds (figure 4 supine) Aerobic Tread Mill: 5 min @ 1.7 mph w/cueing for posture Supine Ab Set: Limitations AB Set Limitations: 3 reps 10 sec holds w/diaphragmatic breathing Bridge: 15 reps Other Supine Lumbar Exercises: PF 2x10 sec holds Prone  Other Prone Lumbar Exercises: hip ER x10 reps BLE; diphargmatic breathing 6 reps w/cueing Other Prone Lumbar Exercises: PF 5x10 sec holds  Modalities Modalities: Moist Heat Manual Therapy Manual Therapy: Joint mobilization Joint Mobilization: PRONE: Grade II-III to B hips with hip ER w/STM to low back after to decrease pain x10 minutes.  Moist Heat Therapy Number Minutes Moist Heat: 10 Minutes Moist Heat Location:  (Back)  Physical Therapy Assessment and Plan PT Assessment and Plan Clinical Impression Statement: Pt continues to show improvements with decreased muscular spasms and improved L2 mobility.  Has improved coordination to PF and TrA musculature at end of session.  PT Plan: No modalities.  Add t-band activities next visit.     Goals    Problem List Patient Active Problem List  Diagnosis  . HYPERLIPIDEMIA  . GASTROESOPHAGEAL REFLUX DISEASE  . Hepatic steatosis  . TOBACCO ABUSE  . CHRONIC  OBSTRUCTIVE PULMONARY DISEASE  . DIABETES MELLITUS-TYPE II  . Hypertension  . Anxiety and depression  . Palpitations  . Chronic low back pain  . Arteriosclerotic cardiovascular disease (ASCVD)  . Spinal stenosis of lumbar region with neurogenic claudication  . Unspecified vitamin D deficiency    PT Plan of Care PT Patient Instructions: discussed importance of posture.  Consulted and Agree with Plan of Care: Patient  GP    Leasha Goldberger 08/23/2012, 12:19 PM

## 2012-08-26 ENCOUNTER — Ambulatory Visit (HOSPITAL_COMMUNITY): Payer: Self-pay | Admitting: Physical Therapy

## 2012-08-27 ENCOUNTER — Inpatient Hospital Stay (HOSPITAL_COMMUNITY): Admission: RE | Admit: 2012-08-27 | Payer: Self-pay | Source: Ambulatory Visit

## 2012-08-28 ENCOUNTER — Inpatient Hospital Stay (HOSPITAL_COMMUNITY): Admission: RE | Admit: 2012-08-28 | Payer: Self-pay | Source: Ambulatory Visit

## 2012-08-29 ENCOUNTER — Ambulatory Visit (HOSPITAL_COMMUNITY): Payer: Self-pay | Admitting: Psychology

## 2012-08-30 ENCOUNTER — Ambulatory Visit (HOSPITAL_COMMUNITY): Payer: Self-pay

## 2012-09-02 ENCOUNTER — Ambulatory Visit (HOSPITAL_COMMUNITY): Payer: Self-pay | Admitting: Physical Therapy

## 2012-09-04 ENCOUNTER — Ambulatory Visit (HOSPITAL_COMMUNITY): Payer: Self-pay

## 2012-09-06 ENCOUNTER — Telehealth (HOSPITAL_COMMUNITY): Payer: Self-pay

## 2012-09-06 ENCOUNTER — Ambulatory Visit (HOSPITAL_COMMUNITY): Payer: Self-pay | Admitting: Physical Therapy

## 2012-09-09 ENCOUNTER — Telehealth: Payer: Self-pay | Admitting: Orthopedic Surgery

## 2012-09-09 NOTE — Telephone Encounter (Signed)
Patient called, states [name] needs [facility] new order due to some missed therapy visits during the time that he was ill, and also due to transportation.  Please let him know if order is being faxed so that he may schedule his next appointment there.  Patient Ph# [phone]

## 2012-09-09 NOTE — Telephone Encounter (Signed)
Faxed order to [facility] and notified patient.

## 2012-09-10 ENCOUNTER — Encounter (HOSPITAL_COMMUNITY): Payer: Self-pay | Admitting: Psychology

## 2012-09-10 ENCOUNTER — Ambulatory Visit (INDEPENDENT_AMBULATORY_CARE_PROVIDER_SITE_OTHER): Payer: Self-pay | Admitting: Psychology

## 2012-09-10 DIAGNOSIS — F4001 Agoraphobia with panic disorder: Secondary | ICD-10-CM

## 2012-09-10 DIAGNOSIS — F419 Anxiety disorder, unspecified: Secondary | ICD-10-CM

## 2012-09-10 DIAGNOSIS — F411 Generalized anxiety disorder: Secondary | ICD-10-CM

## 2012-09-10 NOTE — Progress Notes (Signed)
Patient:  Richard [name]   DOB: 1965-12-14  MR Number: [id number]  Location: [facility] [address] [place] [address] Dept: [phone]  Start: 11 AM End: 12 PM  Provider/Observer:     Hershal Coria PSYD  Chief Complaint:      Chief Complaint  Patient presents with  . Panic Attack  . Anxiety    Reason For Service:    The patient was referred by Dr. Dietrich Pates because of severe anxiety and foot appear to be panic attacks. The patient reports that he has been having a lot of anxiety and panic attacks. He has numerous medical issues including severe issues with diabetes, COPD, fatty liver disease and orthopedic issues. He also has had a long history of depression anxiety as well. Anxiety and depression have been going on for least 5 years and correlate with his health issues and financial difficulties. The patient relates this deterioration to an injury at work where he injured both of his shoulders while pulling a pallet. He had surgery on one of his shoulders initially and he experiences around his injury and surgery began to develop panic attacks. The patient is also been diagnosed with COPD and diabetes but continues to smoke and fears that if he completely quit smoking that he will gain a lot of weight again.   Interventions Strategy:  Cognitive/behavioral psychotherapeutic interventions  Participation Level:   Active  Participation Quality:  Appropriate      Behavioral Observation:  Fairly Groomed, Alert, and Appropriate.   Current Psychosocial Factors: The patient reports that there has continued to be some stressors with regard to his inability to work or contribute significantly to his household. The patient reports that there've been some significant situations where he is had to rely on his family navigate about because of issues related to both his health functioning as well as his  severe panic attacks..  Content of Session:   Review current symptoms and continued work on therapeutic interventions around numerous medical issues and panic attacks.  Current Status:   The patient reports that while he possible frequency of panic attacks have reduced and he has been actively working on behavioral interventions for panic attacks he is continuing to have those but more importantly continues to be quite terrified and afraid that he will have them.  Patient Progress:   Stable  Target Goals:   Target goals include reduce the intensity, duration, and frequency of panic attacks, reduce the amount of benzodiazepine or other potentially dependence forming medications.  Last Reviewed:   09/10/2012  Goals Addressed Today:    Today we worked on issues of reducing the intensity and frequency of panic attacks  Impression/Diagnosis:   the patient appears to be doing with panic attacks along with anxiety and depression. He has numerous medical issues including COPD, diabetes, and other issues as well as liver disease. The patient is not taking very good care of himself and it was stressed specifically that he is going to need to quit smoking cigarettes and completely stopped caffeinated and sugar drinks if he is going to have any hope of improving his quality of life. The patient was amenable to initially try to stop the caffeinated drinks and we developed a plan for this. After that we will begin to address the smoking.    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

## 2012-09-16 ENCOUNTER — Ambulatory Visit (HOSPITAL_COMMUNITY)
Admission: RE | Admit: 2012-09-16 | Discharge: 2012-09-16 | Disposition: A | Discharge status: Home / Self Care | Payer: Self-pay | Source: Ambulatory Visit | Attending: Orthopedic Surgery | Admitting: Orthopedic Surgery

## 2012-09-16 DIAGNOSIS — M545 Low back pain, unspecified: Secondary | ICD-10-CM | POA: Insufficient documentation

## 2012-09-16 DIAGNOSIS — M6281 Muscle weakness (generalized): Secondary | ICD-10-CM | POA: Insufficient documentation

## 2012-09-16 DIAGNOSIS — IMO0001 Reserved for inherently not codable concepts without codable children: Secondary | ICD-10-CM | POA: Insufficient documentation

## 2012-09-16 NOTE — Evaluation (Signed)
Physical Therapy Re-Evaluation  Patient Details  Name: Richard [name] MRN: [id number] Date of Birth: 08-24-1965  Today's Date: 09/16/2012 Time: 1303-1356 PT Time Calculation (min): 53 min Charges: 1 Re-eval, 35' TE Visit#: 4 of 12  Re-eval: 10/16/12 Assessment Diagnosis: LBP w/Radicular symptoms to LLE Next MD Visit: Dr. Romeo Apple - unscheduled Prior Therapy: 3-4 years [facility]  Authorization: [facility] discount paper work filled out and re-submitted   Subjective Symptoms/Limitations Symptoms: Pt reports that he has had difficulty with his cars and difficulty w/transportation. He states that he was looking under his car on Friday and states that he strained his neck.  He has been doing his HEP and has helped his low back pain as well as improving his posture. He reports that he has not fallen as much since he has started therapy.  He reports increased anxiety with driving long distances and walking through a long  How long can you sit comfortably?: can walk comfortably through a small grocery store.  How long can you stand comfortably?: with less weight shift and a little limp due to occasional L leg pain.   Pain Assessment Currently in Pain?: Yes Pain Score:   5 (3/10 to his back) Pain Location: Neck  Precautions/Restrictions  Precautions Precaution Comments: Hx of Cancer  Sensation/Coordination/Flexibility/Functional Tests Coordination Coordination and Movement Description: independent coordination to TrA, PF musculature Functional Tests Functional Tests: ODI: 54% (was 70%)  Assessment RLE AROM (degrees) RLE Overall AROM Comments: Hip IR: 30 (was 10 degrees) LLE AROM (degrees) LLE Overall AROM Comments: Hip IR: 23 (was 17) Lumbar AROM Lumbar Flexion: decreased 25% - most painful pain (was 50% most painful) Lumbar Extension: decreased 10% (was 25%) Lumbar - Right Side Bend: decreased 10% - pain (was 10%) Lumbar - Left Side Bend: decreased - 10% (was  10%) Palpation Palpation: minimal fascial restrictions w/pain and tenderness to L lower erector spinae and gluteus minimus region.  Atrophy to L multifidus    Mobility/Balance  Posture/Postural Control Postural Limitations: ridgid posture, decreased lordosis   Exercise/Treatments Standing Row: Both;10 reps;Theraband Theraband Level (Row): Level 4 (Blue) Shoulder Extension: Both;10 reps;Theraband Theraband Level (Shoulder Extension): Level 4 (Blue) Other Standing Lumbar Exercises: chair pose 2x15 sec Other Standing Lumbar Exercises: tandem gait 1 RT Supine Clam: 10 reps (both) Bent Knee Raise: 10 reps (both) Dead Bug: 10 reps (both sides, unilateral) Bridge: 15 reps Straight Leg Raise: 10 reps (Both) Other Supine Lumbar Exercises: Pilates "100's" 30x complete Prone  Straight Leg Raise: 5 reps;Limitations (LEft w/5 sec holds) Opposite Arm/Leg Raise: Right arm/Left leg;Left arm/Right leg;10 reps Other Prone Lumbar Exercises: hip IR: x15 BLE  Physical Therapy Assessment and Plan PT Assessment and Plan Clinical Impression Statement: Richard [name] has attended 4 OPPT visits since 08/08/12 w/following findings: independent with his HEP, has improved his core coordination, improved lumbar ROM and continues to have atrophy to L multifdus w/significant pain and tenderness.  Overall pt has made good progress on his own and can conitinue to benefit from PT for updated HEP and continued progression to address fear of falling.  PT Frequency: Min 2X/week PT Duration: 4 weeks PT Treatment/Interventions: Therapeutic activities;Therapeutic exercise;Balance training;Neuromuscular re-education;Patient/family education;Manual techniques PT Plan: No modalities.  Add more t-band activities (adduction, scap retraction), balance activities on foam, yoga poses to improve balance confidence.  Continue to progress high level balance     Goals Home Exercise Program Pt will Perform Home Exercise Program:  Independently PT Goal: Perform Home Exercise Program - Progress:  Met PT Short Term Goals Time to Complete Short Term Goals: 3 weeks PT Short Term Goal 1: Pt will report pain less than 3/10 for 75% of his day for improved QOL.  PT Short Term Goal 1 - Progress: Met PT Short Term Goal 2: Pt will improve his hip IR AROM to 25 degress to decrease low back pain.  PT Short Term Goal 2 - Progress: Progressing toward goal PT Short Term Goal 3: Pt will improve his L leg SLR to 80 degrees for improved LE flexibility. PT Short Term Goal 3 - Progress: Progressing toward goal PT Short Term Goal 4: Pt will improve his lumbar ROM to WNL without reports of pain at end range for improved QOL.  PT Short Term Goal 4 - Progress: Progressing toward goal PT Long Term Goals Time to Complete Long Term Goals: Other (comment) (6 weeks) PT Long Term Goal 1: Pt will improve his ODI to less than 50% to improve QOL.  PT Long Term Goal 1 - Progress: Progressing toward goal PT Long Term Goal 2: Pt will present with decreaed fascial restrictions to lumbar erector spinae and gluteal region for improved lumbar mobility.  PT Long Term Goal 2 - Progress: Progressing toward goal Long Term Goal 3: Pt will improve core strength to [facility] in order to tolerate sitting for greater than 1 hour to enjoy playing with his grandchildren.  Long Term Goal 3 Progress: Progressing toward goal Long Term Goal 4: Pt will improve postural strength in order to tolerate standing and walking for greater than 45 minutes (as allowed by his COPD) in order to go shopping with his wife.  Long Term Goal 4 Progress: Progressing toward goal  Problem List Patient Active Problem List  Diagnosis  . HYPERLIPIDEMIA  . GASTROESOPHAGEAL REFLUX DISEASE  . Hepatic steatosis  . TOBACCO ABUSE  . CHRONIC OBSTRUCTIVE PULMONARY DISEASE  . DIABETES MELLITUS-TYPE II  . Hypertension  . Anxiety and depression  . Palpitations  . Chronic low back pain  .  Arteriosclerotic cardiovascular disease (ASCVD)  . Spinal stenosis of lumbar region with neurogenic claudication  . Unspecified vitamin D deficiency    PT Plan of Care PT Home Exercise Plan: updated w/advaced core activities.  PT Patient Instructions: discussed ODI and complaince.  Consulted and Agree with Plan of Care: Patient  Annett Fabian, PT 09/16/2012, 2:35 PM  Physician Documentation Your signature is required to indicate approval of the treatment plan as stated above.  Please sign and either send electronically or make a copy of this report for your files and return this physician signed original.   Please mark one 1.__approve of plan  2. ___approve of plan with the following conditions.   ______________________________                                                          _____________________ Physician Signature  Date  

## 2012-09-24 ENCOUNTER — Ambulatory Visit (INDEPENDENT_AMBULATORY_CARE_PROVIDER_SITE_OTHER): Payer: Self-pay | Admitting: Psychology

## 2012-09-24 ENCOUNTER — Ambulatory Visit (HOSPITAL_COMMUNITY): Payer: Self-pay | Admitting: Physical Therapy

## 2012-09-24 DIAGNOSIS — F411 Generalized anxiety disorder: Secondary | ICD-10-CM

## 2012-09-24 DIAGNOSIS — F419 Anxiety disorder, unspecified: Secondary | ICD-10-CM

## 2012-09-24 DIAGNOSIS — F4001 Agoraphobia with panic disorder: Secondary | ICD-10-CM

## 2012-09-25 ENCOUNTER — Ambulatory Visit (HOSPITAL_COMMUNITY)
Admission: RE | Admit: 2012-09-25 | Discharge: 2012-09-25 | Disposition: A | Discharge status: Home / Self Care | Payer: Self-pay | Source: Ambulatory Visit | Attending: Orthopedic Surgery | Admitting: Orthopedic Surgery

## 2012-09-25 NOTE — Progress Notes (Signed)
Physical Therapy Treatment Patient Details  Name: Richard [name] MRN: [id number] Date of Birth: [birth year]  Today's Date: 09/25/2012 Time: 1191-4782 PT Time Calculation (min): 47 min Charge: therex 82'  Visit#: 5 of 12  Re-eval: 10/16/12    Authorization:    Authorization Time Period:    Authorization Visit#:   of     Subjective: Symptoms/Limitations Symptoms: Pt reported he is feeling better today following activte day.  Today pain scale 4/10 posterior neck, L hip and LBP. Pain Assessment Currently in Pain?: Yes Pain Score:   4 Pain Location: Neck Multiple Pain Sites: Yes  Objective:   Exercise/Treatments Standing Scapular Retraction: Both;10 reps;Theraband Theraband Level (Scapular Retraction): Level 4 (Blue) Row: Both;10 reps;Theraband Theraband Level (Row): Level 4 (Blue) Shoulder Extension: Both;10 reps;Theraband Theraband Level (Shoulder Extension): Level 4 (Blue) Shoulder ADduction: Both;10 reps;Theraband Theraband Level (Shoulder Adduction): Level 4 (Blue) Other Standing Lumbar Exercises: blue tband IR/ER 10X BUE Other Standing Lumbar Exercises: chair pose 2x 15", warrior pose I and II 2X15" each LE leading; tandem gait on balance beam Prone  Opposite Arm/Leg Raise: Right arm/Left leg;Left arm/Right leg;10 reps;5 seconds Other Prone Lumbar Exercises: mulifidus 10x 10"  Physical Therapy Assessment and Plan PT Assessment and Plan Clinical Impression Statement: Added postural strengthening tband exercises and progressed high level balance activities on dynamic surfaces.  Pt with tendency for forward head with standing activities, vc-ing for posture required and educated on importance of good posture to assist with pain relief.  Pt with CGA with dynamic balance and cueing for spatial awareness to reduce LOB episodes.  Pt limited by musculature fatigue, stated no increase pain with activites. PT Plan: No modalities. Progress high level balance activities on foam, yoga  poses to improve balance confidence.  Begin Warrior pose III next session and cervical retraction/ postural strengthening exercises.    Goals    Problem List Patient Active Problem List  Diagnosis  . HYPERLIPIDEMIA  . GASTROESOPHAGEAL REFLUX DISEASE  . Hepatic steatosis  . TOBACCO ABUSE  . CHRONIC OBSTRUCTIVE PULMONARY DISEASE  . DIABETES MELLITUS-TYPE II  . Hypertension  . Anxiety and depression  . Palpitations  . Chronic low back pain  . Arteriosclerotic cardiovascular disease (ASCVD)  . Spinal stenosis of lumbar region with neurogenic claudication  . Unspecified vitamin D deficiency    PT - End of Session Activity Tolerance: Patient tolerated treatment well;Patient limited by fatigue General Behavior During Session: [facility] for tasks performed Cognition: [facility] for tasks performed  GP    Juel Burrow 09/25/2012, 10:27 AM

## 2012-09-26 ENCOUNTER — Ambulatory Visit (HOSPITAL_COMMUNITY): Payer: Self-pay

## 2012-09-27 ENCOUNTER — Ambulatory Visit (HOSPITAL_COMMUNITY)
Admission: RE | Admit: 2012-09-27 | Discharge: 2012-09-27 | Disposition: A | Discharge status: Home / Self Care | Payer: Self-pay | Source: Ambulatory Visit | Attending: Orthopedic Surgery | Admitting: Orthopedic Surgery

## 2012-09-27 NOTE — Progress Notes (Signed)
Physical Therapy Treatment Patient Details  Name: Richard [name] MRN: [id number] Date of Birth: July 09, 1966  Today's Date: 09/27/2012 Time: 5621-3086 PT Time Calculation (min): 46 min Charges: 34' TE, 12' manual  Visit#: 6 of 12  Re-eval: 10/16/12    Authorization: [facility] discount paper work filled out and re-submitted  Authorization Time Period:    Authorization Visit#:   of     Subjective: Symptoms/Limitations Symptoms: Pt reports that his low back is getting better, but his neck and lt knee are giving him the most problems.he thinks the exercises are helping with his low back pain.  Pain Assessment Currently in Pain?: Yes Pain Score:   2 Pain Location: Back  Exercise/Treatments Stretches Active Hamstring Stretch: 3 reps;30 seconds (Both) Quad Stretch: 2 reps;30 seconds;Limitations Quad Stretch Limitations: hip internal rotation strectch  Aerobic Tread Mill: 10 minutes 2.0 mph w/moderate cueing for posture.  Prone  Other Prone Lumbar Exercises: Prone on elbows: cervical rotation x10, flexion/extension x10 reps; serratus anterior x10 reps Other Prone Lumbar Exercises: Shoulder flexion x10 reps, Shoulder abduction x10 reps, shoulder extension palms down x10 reps  Manual Therapy Manual Therapy: Joint mobilization Joint Mobilization: Prone: Grade I-III to lumbar spinous process L5-T7 w/STM after to decrease low back pain. x12 minutes   Physical Therapy Assessment and Plan PT Assessment and Plan Clinical Impression Statement: Added activities to improve scapular and paraspinal strength.  Continues to have the greatest limiations with impaired LE flexibility especially with hip IR.  After manual therapy had a reduction in muscle tone to lumbar paraspinals.  PT Plan: No modalities. Progress high level balance activities on foam, yoga poses to improve balance confidence.  Begin Warrior pose III next session and cervical retraction/ postural strengthening exercises.    Goals     Problem List Patient Active Problem List  Diagnosis  . HYPERLIPIDEMIA  . GASTROESOPHAGEAL REFLUX DISEASE  . Hepatic steatosis  . TOBACCO ABUSE  . CHRONIC OBSTRUCTIVE PULMONARY DISEASE  . DIABETES MELLITUS-TYPE II  . Hypertension  . Anxiety and depression  . Palpitations  . Chronic low back pain  . Arteriosclerotic cardiovascular disease (ASCVD)  . Spinal stenosis of lumbar region with neurogenic claudication  . Unspecified vitamin D deficiency    PT - End of Session Activity Tolerance: Patient tolerated treatment well;Patient limited by fatigue General Behavior During Session: [facility] for tasks performed Cognition: [facility] for tasks performed  Romell Wolden, PT 09/27/2012, 3:05 PM

## 2012-09-30 ENCOUNTER — Encounter: Payer: Self-pay | Admitting: Cardiology

## 2012-09-30 ENCOUNTER — Ambulatory Visit (INDEPENDENT_AMBULATORY_CARE_PROVIDER_SITE_OTHER): Payer: Self-pay | Admitting: Cardiology

## 2012-09-30 VITALS — BP 126/75 | HR 63 | Ht 71.5 in | Wt 212.5 lb

## 2012-09-30 DIAGNOSIS — I10 Essential (primary) hypertension: Secondary | ICD-10-CM

## 2012-09-30 DIAGNOSIS — I2510 Atherosclerotic heart disease of native coronary artery without angina pectoris: Secondary | ICD-10-CM

## 2012-09-30 DIAGNOSIS — F341 Dysthymic disorder: Secondary | ICD-10-CM

## 2012-09-30 DIAGNOSIS — E119 Type 2 diabetes mellitus without complications: Secondary | ICD-10-CM

## 2012-09-30 DIAGNOSIS — M48062 Spinal stenosis, lumbar region with neurogenic claudication: Secondary | ICD-10-CM

## 2012-09-30 DIAGNOSIS — E782 Mixed hyperlipidemia: Secondary | ICD-10-CM

## 2012-09-30 DIAGNOSIS — F17200 Nicotine dependence, unspecified, uncomplicated: Secondary | ICD-10-CM

## 2012-09-30 DIAGNOSIS — I7090 Unspecified atherosclerosis: Secondary | ICD-10-CM

## 2012-09-30 DIAGNOSIS — F329 Major depressive disorder, single episode, unspecified: Secondary | ICD-10-CM

## 2012-09-30 MED ORDER — PROPRANOLOL HCL 10 MG PO TABS: 10.0000 mg | ORAL_TABLET | Freq: Three times a day (TID) | ORAL | Status: DC

## 2012-09-30 NOTE — Patient Instructions (Addendum)
Your physician recommends that you schedule a follow-up appointment in: 8 months  Your physician recommends that you return for lab work in: This week  Your physician has recommended you make the following change in your medication:  1 - Propranolol 10 mg three times a day 2 - STOP Metoprolol   Call for Blood Pressure >140/90

## 2012-09-30 NOTE — Assessment & Plan Note (Signed)
Back discomfort and ability to ambulate has improved with physical therapy. Patient is very grateful to Dr. Romeo Apple for his assistance.

## 2012-09-30 NOTE — Assessment & Plan Note (Signed)
No symptoms now to suggest progression of coronary disease.

## 2012-09-30 NOTE — Assessment & Plan Note (Signed)
Blood pressure control has been excellent.  Patient will report if this changes.

## 2012-09-30 NOTE — Assessment & Plan Note (Signed)
Symptoms have improved with psychologic intervention. The suggestion was made to substitute propranolol for metoprolol. I explained to [name] that this is a minor change, but represents an effective decrease in dosage. We will continue to monitor blood pressure, and reported elevated values. I also advised that he take Cymbalta as prescribed.

## 2012-09-30 NOTE — Progress Notes (Deleted)
Name: Richard [name]    DOB: 26-Mar-1966  Age: 47 y.o.  MR#: [id number]       PCP:  Provider Not In System      Insurance: Payor:  No coverage found.   CC:   No chief complaint on file.  MEDICATION LIST - NOT TAKING INDERAL, NEURONTIN OR CYMBALTA, AS HE STATES THAT SCRIPTS ARENT AT PHARMACY, HOWEVER NEVER TOLD ANYONE  LAST SEEN BY DR HARRISON 1/16  HAS BEEN SEEING [facility] SINCE 12/18  VS Filed Vitals:   09/30/12 1113  BP: 126/75  Pulse: 63  Height: 5' 11.5" (1.816 m)  Weight: 212 lb 8 oz (96.389 kg)  SpO2: 96%    Weights Current Weight  09/30/12 212 lb 8 oz (96.389 kg)  08/20/12 209 lb (94.802 kg)  08/01/12 211 lb (95.709 kg)    Blood Pressure  BP Readings from Last 3 Encounters:  09/30/12 126/75  05/27/12 100/50  04/11/12 116/76     Admit date:  (Not on file) Last encounter with RMR:  06/12/2012   Allergy Mushroom ext cmplx(shiitake-reishi-mait) and Penicillins  Current Outpatient Prescriptions  Medication Sig Dispense Refill  . acetaminophen (TYLENOL) 325 MG tablet Take 650 mg by mouth every 6 (six) hours as needed. For pain      . albuterol (VENTOLIN HFA) 108 (90 BASE) MCG/ACT inhaler Inhale 2 puffs into the lungs every 6 (six) hours as needed.      . ALPRAZolam (XANAX) 0.5 MG tablet Take 0.5 mg by mouth at bedtime as needed.        [name] aspirin 81 MG tablet Take 81 mg by mouth at bedtime.       [name] esomeprazole (NEXIUM) 40 MG capsule Take 40 mg by mouth 2 (two) times daily.      . Fluticasone-Salmeterol (ADVAIR) 250-50 MCG/DOSE AEPB Inhale 1 puff into the lungs every 12 (twelve) hours.      [name] HYDROcodone-acetaminophen (VICODIN) 5-500 MG per tablet Take 1 tablet by mouth every 6 (six) hours as needed for pain.      . Liniments (ANALGESIC GRX BALM) OINT Apply 1 g topically 4 (four) times daily.  30 g  3  . lisinopril (PRINIVIL,ZESTRIL) 20 MG tablet Take 1 tablet (20 mg total) by mouth daily.  30 tablet  5  . lovastatin (MEVACOR) 20 MG tablet TAKE ONE TABLET BY MOUTH EVERY DAY  AT BEDTIME **REPLACES  LIPITOR**  30 tablet  5  . metFORMIN (GLUCOPHAGE) 500 MG tablet Take 500 mg by mouth 2 (two) times daily with a meal.        . metoprolol succinate (TOPROL-XL) 50 MG 24 hr tablet Take 75 mg by mouth at bedtime. 1 and 1/2 tab po daily       . nitroGLYCERIN (NITROSTAT) 0.4 MG SL tablet Place 0.4 mg under the tongue every 5 (five) minutes as needed.      . cyclobenzaprine (FLEXERIL) 10 MG tablet Take 1 tablet (10 mg total) by mouth 3 (three) times daily as needed for muscle spasms. For Muscle spasms  90 tablet  0  . DULoxetine (CYMBALTA) 30 MG capsule Take 1 capsule (30 mg total) by mouth daily.  30 capsule  2  . gabapentin (NEURONTIN) 100 MG capsule Take 1 capsule (100 mg total) by mouth 3 (three) times daily.  90 capsule  2  . propranolol (INDERAL) 10 MG tablet Take 1 tablet (10 mg total) by mouth 3 (three) times daily.  90 tablet  1   No current  facility-administered medications for this visit.    Discontinued Meds:    Medications Discontinued During This Encounter  Medication Reason  . lisinopril (PRINIVIL,ZESTRIL) 20 MG tablet Error  . HYDROcodone-acetaminophen (VICODIN ES) 7.5-750 MG per tablet Error    Patient Active Problem List  Diagnosis  . HYPERLIPIDEMIA  . GASTROESOPHAGEAL REFLUX DISEASE  . Hepatic steatosis  . TOBACCO ABUSE  . CHRONIC OBSTRUCTIVE PULMONARY DISEASE  . DIABETES MELLITUS-TYPE II  . Hypertension  . Anxiety and depression  . Palpitations  . Chronic low back pain  . Arteriosclerotic cardiovascular disease (ASCVD)  . Spinal stenosis of lumbar region with neurogenic claudication  . Unspecified vitamin D deficiency    LABS    Component Value Date/Time   NA 137 01/08/2012 1840   NA 140 05/02/2011 0930   NA 137 04/07/2011 0504   K 3.7 01/08/2012 1840   K 4.5 05/02/2011 0930   K 3.2* 04/07/2011 0504   CL 100 01/08/2012 1840   CL 102 05/02/2011 0930   CL 98 04/07/2011 0504   CO2 24 01/08/2012 1840   CO2 30 05/02/2011 0930   CO2 28  04/07/2011 0504   GLUCOSE 153* 01/08/2012 1840   GLUCOSE 146* 05/02/2011 0930   GLUCOSE 153* 04/07/2011 0504   BUN 15 01/08/2012 1840   BUN 12 05/02/2011 0930   BUN 13 04/07/2011 0504   CREATININE 1.08 01/08/2012 1840   CREATININE 0.94 05/02/2011 0930   CREATININE 1.00 04/07/2011 0504   CREATININE 0.92 03/22/2011 1549   CALCIUM 10.0 01/08/2012 1840   CALCIUM 9.6 05/02/2011 0930   CALCIUM 9.6 04/07/2011 0504   GFRNONAA 80* 01/08/2012 1840   GFRNONAA >90 05/02/2011 0930   GFRNONAA >60 04/07/2011 0504   GFRAA >90 01/08/2012 1840   GFRAA >90 05/02/2011 0930   GFRAA >60 04/07/2011 0504   CMP     Component Value Date/Time   NA 137 01/08/2012 1840   K 3.7 01/08/2012 1840   CL 100 01/08/2012 1840   CO2 24 01/08/2012 1840   GLUCOSE 153* 01/08/2012 1840   BUN 15 01/08/2012 1840   CREATININE 1.08 01/08/2012 1840   CREATININE 0.92 03/22/2011 1549   CALCIUM 10.0 01/08/2012 1840   PROT 6.9 03/22/2011 1549   ALBUMIN 4.5 03/22/2011 1549   AST 17 03/22/2011 1549   ALT 30 03/22/2011 1549   ALKPHOS 59 03/22/2011 1549   BILITOT 0.3 03/22/2011 1549   GFRNONAA 80* 01/08/2012 1840   GFRAA >90 01/08/2012 1840       Component Value Date/Time   WBC 8.1 01/08/2012 1840   WBC 7.5 05/02/2011 1525   WBC 7.9 04/07/2011 0504   HGB 15.0 01/08/2012 1840   HGB 15.5 05/02/2011 1525   HGB 16.1 04/07/2011 0504   HCT 43.2 01/08/2012 1840   HCT 45.7 05/02/2011 1525   HCT 45.9 04/07/2011 0504   MCV 89.3 01/08/2012 1840   MCV 91.2 05/02/2011 1525   MCV 90.0 04/07/2011 0504    Lipid Panel     Component Value Date/Time   CHOL 129 04/28/2011 0000   TRIG 124 04/28/2011 0000   HDL 33* 04/28/2011 0000   CHOLHDL 3.9 04/28/2011 0000   VLDL 25 04/28/2011 0000   LDLCALC 71 04/28/2011 0000    ABG    Component Value Date/Time   PHART 7.386 04/25/2011 1500   PCO2ART 41.8 04/25/2011 1500   PO2ART 87.0 04/25/2011 1500   HCO3 24.6* 04/25/2011 1500   TCO2 21.1 04/25/2011 1500   O2SAT 97.2 04/25/2011 1500  Lab Results  Component Value Date    TSH 0.958 04/21/2011   BNP (last 3 results) No results found for this basename: PROBNP,  in the last 8760 hours Cardiac Panel (last 3 results) No results found for this basename: CKTOTAL, CKMB, TROPONINI, RELINDX,  in the last 72 hours  Iron/TIBC/Ferritin No results found for this basename: iron, tibc, ferritin     EKG Orders placed during the hospital encounter of 01/08/12  . EKG 12-LEAD  . EKG 12-LEAD  . EKG     Prior Assessment and Plan Problem List as of 09/30/2012     ICD-9-CM     Cardiology Problems   HYPERLIPIDEMIA   Last Assessment & Plan   04/11/2012 Office Visit Written 04/11/2012 12:45 PM by Kathlen Brunswick, MD     Hyperlipidemia adequately controlled when assessed a few months ago.  Current therapy will be maintained.    Hypertension   Last Assessment & Plan   05/27/2012 Office Visit Written 05/27/2012  7:58 PM by Kathlen Brunswick, MD     Blood pressure control has been excellent with current therapy, which will be continued.    Arteriosclerotic cardiovascular disease (ASCVD)   Last Assessment & Plan   05/27/2012 Office Visit Written 05/27/2012  7:59 PM by Kathlen Brunswick, MD     Insignificant coronary disease noted on catheterization in 2005.  No definite evidence for ischemia since.  Recent chest pain is noncardiac by patient's description      Other   Palpitations   Last Assessment & Plan   04/11/2012 Office Visit Written 04/11/2012 12:46 PM by Kathlen Brunswick, MD     Palpitations persist, but by description did not appear to represent a significant arrhythmia.    GASTROESOPHAGEAL REFLUX DISEASE   Last Assessment & Plan   02/09/2012 Office Visit Edited 02/09/2012 10:30 AM by Joselyn Arrow, NP     Well-controlled on Nexium 40mg  BID.    Hepatic steatosis   Last Assessment & Plan   03/22/2011 Office Visit Written 03/22/2011  4:40 PM by Kathlen Brunswick, MD     Hepatic disorder is managed by Dr. Lionel December.  The importance of weight loss and restriction  of concentrated sweets from his diet was emphasized.    TOBACCO ABUSE   Last Assessment & Plan   04/11/2012 Office Visit Edited 04/16/2012  9:51 PM by Kathlen Brunswick, MD     Patient has failed to continue to reduce tobacco use over the past few months, but is insistent that he will do so, and that he is committed to quit.  Consideration of pharmacologic assistance may be necessary in the near future.    CHRONIC OBSTRUCTIVE PULMONARY DISEASE   Last Assessment & Plan   05/27/2012 Office Visit Written 05/27/2012  7:57 PM by Kathlen Brunswick, MD     Complete cessation of tobacco use is desirable, but patient does not feel that he can achieve this until anxiety is adequately treated.    DIABETES MELLITUS-TYPE II   Last Assessment & Plan   10/31/2011 Office Visit Written 10/31/2011 12:48 PM by Kathlen Brunswick, MD     A1c reportedly excellent a few months ago.  Patient advised to discontinue intake of sweet beverages, continue to lose weight and to monitor his CBGs occasionally    Anxiety and depression   Last Assessment & Plan   05/27/2012 Office Visit Written 05/27/2012  7:56 PM by Kathlen Brunswick, MD     Patient  has been unable to secure an appointment at [facility].  We will attempt to intervene so that he can be seen relatively soon.    Chronic low back pain   Last Assessment & Plan   05/27/2012 Office Visit Written 05/27/2012  7:57 PM by Kathlen Brunswick, MD      12/2011-Discussed with Dr. Romeo Apple.  He will evaluate pt despite absence of ability to pay for medical services.    Spinal stenosis of lumbar region with neurogenic claudication   Unspecified vitamin D deficiency       Imaging: No results found.

## 2012-09-30 NOTE — Assessment & Plan Note (Signed)
Tobacco abuse remains a problem. Patient does not appear prepared to address this until his psychologic status has improved substantially.

## 2012-09-30 NOTE — Progress Notes (Signed)
Patient ID: Richard [name], Richard [name]   DOB: 11-25-1965, 47 y.o.   MRN: [id number]  HPI: Schedule return visit for nice young gentleman with a history of insignificant coronary artery disease, cardiovascular risk factors and multiple additional medical problems. Since his last visit, he has become more involved with his health care, seeking treatment at behavioral health with improvement in his anxiety and being seen by Dr. Romeo Apple for chronic back pain. He reports no cardiopulmonary symptoms.  Back pain has improved with physical therapy, but remains a problem. He continues to be anxious and unwilling to go anywhere outside his house alone. He experienced the beginnings of a panic attack in a local store with narrowing of his vision and a sense that the walls were closing in, but was able to control this with behavioral techniques suggested by his psychologist.  Modifications in his medical regime were suggested, but he has been unwilling to undertake these pending review by me. Blood pressure is been followed at home and has been excellent.  He continues to have limited finances and reports that the free clinic no longer supplies all of his medication.  Current Outpatient Prescriptions  Medication Sig Dispense Refill  . acetaminophen (TYLENOL) 325 MG tablet Take 650 mg by mouth every 6 (six) hours as needed. For pain      . albuterol (VENTOLIN HFA) 108 (90 BASE) MCG/ACT inhaler Inhale 2 puffs into the lungs every 6 (six) hours as needed.      . ALPRAZolam (XANAX) 0.5 MG tablet Take 0.5 mg by mouth at bedtime as needed.        [name] aspirin 81 MG tablet Take 81 mg by mouth at bedtime.       [name] esomeprazole (NEXIUM) 40 MG capsule Take 40 mg by mouth 2 (two) times daily.      . Fluticasone-Salmeterol (ADVAIR) 250-50 MCG/DOSE AEPB Inhale 1 puff into the lungs every 12 (twelve) hours.      [name] HYDROcodone-acetaminophen (VICODIN) 5-500 MG per tablet Take 1 tablet by mouth every 6 (six) hours as needed for pain.      .  Liniments (ANALGESIC GRX BALM) OINT Apply 1 g topically 4 (four) times daily.  30 g  3  . lisinopril (PRINIVIL,ZESTRIL) 20 MG tablet Take 1 tablet (20 mg total) by mouth daily.  30 tablet  5  . lovastatin (MEVACOR) 20 MG tablet TAKE ONE TABLET BY MOUTH EVERY DAY AT BEDTIME **REPLACES  LIPITOR**  30 tablet  5  . metFORMIN (GLUCOPHAGE) 500 MG tablet Take 500 mg by mouth 2 (two) times daily with a meal.        . metoprolol succinate (TOPROL-XL) 50 MG 24 hr tablet Take 75 mg by mouth at bedtime. 1 and 1/2 tab po daily       . nitroGLYCERIN (NITROSTAT) 0.4 MG SL tablet Place 0.4 mg under the tongue every 5 (five) minutes as needed.      . cyclobenzaprine (FLEXERIL) 10 MG tablet Take 1 tablet (10 mg total) by mouth 3 (three) times daily as needed for muscle spasms. For Muscle spasms  90 tablet  0  . DULoxetine (CYMBALTA) 30 MG capsule Take 1 capsule (30 mg total) by mouth daily.  30 capsule  2  . gabapentin (NEURONTIN) 100 MG capsule Take 1 capsule (100 mg total) by mouth 3 (three) times daily.  90 capsule  2  . propranolol (INDERAL) 10 MG tablet Take 1 tablet (10 mg total) by mouth 3 (three) times daily.  90 tablet  6   No current facility-administered medications for this visit.    Allergies  Allergen Reactions  . Mushroom Ext Cmplx(Shiitake-Reishi-Mait) Anaphylaxis    Rapid heart rate.  . Penicillins     REACTION: Unknown reaction     Past medical history, social history, and family history reviewed and updated.  ROS: Denies chest pain, dyspnea, lightheadedness or syncope. All other systems reviewed and are negative.  PHYSICAL EXAM: BP 126/75  Pulse 63  Ht 5' 11.5" (1.816 m)  Wt 96.389 kg (212 lb 8 oz)  BMI 29.23 kg/m2  SpO2 96%;  Body mass index is 29.23 kg/(m^2). General-Well developed; no acute distress Body habitus-proportionate weight and height Neck-No JVD; no carotid bruits Lungs-clear lung fields; resonant to percussion Cardiovascular-normal PMI; normal S1 and  S2 Abdomen-normal bowel sounds; soft and non-tender without masses or organomegaly Musculoskeletal-No deformities, no cyanosis or clubbing Neurologic-Normal cranial nerves; symmetric strength and tone Skin-Warm, no significant lesions Extremities-distal pulses intact; no edema  [place], MD 09/30/2012  12:29 PM  ASSESSMENT AND PLAN

## 2012-10-01 ENCOUNTER — Ambulatory Visit (HOSPITAL_COMMUNITY): Payer: Self-pay | Admitting: Psychiatry

## 2012-10-01 ENCOUNTER — Ambulatory Visit (HOSPITAL_COMMUNITY): Payer: Self-pay | Admitting: Physical Therapy

## 2012-10-03 ENCOUNTER — Telehealth: Payer: Self-pay | Admitting: Orthopedic Surgery

## 2012-10-03 NOTE — Telephone Encounter (Signed)
Advise  He will need to see a neurosurgeon of his choice   Advise him i dont do back surgeries or treat chronic back pain

## 2012-10-03 NOTE — Telephone Encounter (Signed)
Richard [name] says the pain Richard [name] is having now is his neck and tingling in his left fingers for about 7-8 days now.  Richard [name] is asking to be seen here, told him  you had said  Richard [name] may need a neurosurgeon or pain management. Since the pain is now in different areas Richard [name] asked if Richard [name] still needs to be referred to the neurosurgeon? Says Richard [name] has reapplied for the [facility] discount. His # [phone]

## 2012-10-04 ENCOUNTER — Ambulatory Visit (HOSPITAL_COMMUNITY)
Admission: RE | Admit: 2012-10-04 | Discharge: 2012-10-04 | Disposition: A | Discharge status: Home / Self Care | Payer: Self-pay | Source: Ambulatory Visit | Attending: Orthopedic Surgery | Admitting: Orthopedic Surgery

## 2012-10-04 NOTE — Progress Notes (Signed)
Physical Therapy Discharge and treatment Patient Details  Name: Richard [name] MRN: [id number] Date of Birth: April 13, 1966  Today's Date: 10/04/2012 Time: 1027-2536 PT Time Calculation (min): 43 min Charges: 1 ROM, 15' TE, 15' Manual, 10' Self Care             Visit#: 6 of 12  Re-eval: 10/16/12 Assessment Diagnosis: LBP w/Radicular symptoms to LLE Next MD Visit: Dr. Romeo Apple - 10/16/12  Authorization: [facility] discount paper work filled out and re-submitted     Subjective Symptoms/Limitations Symptoms: Pt reports that he is moving a little better.  Continues to have stiffness in his neck and had numbness to his Lt arm and first 2 fingers.  He is able to walk about 15 minutes.   Precautions/Restrictions  Precautions Precaution Comments: Hx of Cancer  Assessment Lumbar AROM Lumbar Flexion: decreased 25% - mild pain Lumbar Extension: decreased 10% Lumbar - Right Side Bend: decreased 10% - pain Lumbar - Left Side Bend: decreased - 10% Palpation Palpation: severe popping to Lt hip with sit to stand activity.    Exercise/Treatments Standing Scapular Retraction: Both;Theraband;20 reps Theraband Level (Scapular Retraction): Level 4 (Blue) Row: Both;10 reps;Theraband;20 reps Theraband Level (Row): Level 4 (Blue);Other (comment) Shoulder Extension: Both;Theraband;20 reps Theraband Level (Shoulder Extension): Level 3 (Green) Shoulder ADduction: Both;Theraband;20 reps Theraband Level (Shoulder Adduction): Level 4 (Blue);Level 3 (Green) Other Standing Lumbar Exercises: blue tband IR/ER 10X BUE Other Standing Lumbar Exercises: chair pose 2x 15", warrior pose I and II 2X15" each LE leading; tandem gait on balance beam  Manual Therapy Manual Therapy: Other (comment) Other Manual Therapy: manual cervical traction w/suboccipital release x15 minutes  Physical Therapy Assessment and Plan PT Assessment and Plan Clinical Impression Statement: Mr. Hollister has attended 6 OP PT visits since 08/08/12,  to address LBP with the following findings: he has been educated and can demonstrate proper core activiation and overall has a decrease in LBP.  Is most limited by severe popping to his Lt hip during transitional movements with sit to stand, coming up from the floor to stand, and while getting out of his car likely due to hip pathology.  Also has significant tingling to his LUE likely from cervical pathology in which he was instructed to f/u with a neurologist.  Pt will f/u w/Dr. Romeo Apple on 10/16/12 and recommend d/c from PT until f/u with MD to discuss hip pathology.  Pt has been performing hip and core exercises and is now WNL for all strength.  He was educated and given blue theraband to continue working with at home.  PT Plan: D/C    Goals Home Exercise Program Pt will Perform Home Exercise Program: Independently: Met PT Short Term Goals Time to Complete Short Term Goals: 3 weeks PT Short Term Goal 1: Pt will report pain less than 3/10 for 75% of his day for improved QOL.  PT Short Term Goal 1 - Progress: Met PT Short Term Goal 2: Pt will improve his hip IR AROM to 25 degress to decrease low back pain.  PT Short Term Goal 2 - Progress: Progressing toward goal PT Short Term Goal 3: Pt will improve his L leg SLR to 80 degrees for improved LE flexibility. PT Short Term Goal 3 - Progress: Met PT Short Term Goal 4: Pt will improve his lumbar ROM to WNL without reports of pain at end range for improved QOL.  PT Short Term Goal 4 - Progress: Met PT Long Term Goals Time to Complete Long Term Goals: Other (comment) (  6 weeks) PT Long Term Goal 1: Pt will improve his ODI to less than 50% to improve QOL.  PT Long Term Goal 1 - Progress: Progressing toward goal PT Long Term Goal 2: Pt will present with decreaed fascial restrictions to lumbar erector spinae and gluteal region for improved lumbar mobility.  PT Long Term Goal 2 - Progress: Met Long Term Goal 3: Pt will improve core strength to [facility] in order  to tolerate sitting for greater than 1 hour to enjoy playing with his grandchildren.  Long Term Goal 3 Progress: Progressing toward goal Long Term Goal 4: Pt will improve postural strength in order to tolerate standing and walking for greater than 45 minutes (as allowed by his COPD) in order to go shopping with his wife.  Long Term Goal 4 Progress: Progressing toward goal (15 minutes)  Problem List Patient Active Problem List  Diagnosis  . HYPERLIPIDEMIA  . GASTROESOPHAGEAL REFLUX DISEASE  . Hepatic steatosis  . TOBACCO ABUSE  . CHRONIC OBSTRUCTIVE PULMONARY DISEASE  . DIABETES MELLITUS-TYPE II  . Hypertension  . Anxiety and depression  . Palpitations  . Chronic low back pain  . Arteriosclerotic cardiovascular disease (ASCVD)  . Spinal stenosis of lumbar region with neurogenic claudication  . Unspecified vitamin D deficiency    PT - End of Session Activity Tolerance: Patient tolerated treatment well;Patient limited by fatigue General Behavior During Session: [facility] for tasks performed Cognition: [facility] for tasks performed PT Plan of Care PT Home Exercise Plan: updated with theraband activites and provided pt with blue theraband. PT Patient Instructions: discussed f/u w/MD, D/C, updated HEP, answered questions about hip pathology and back pain. Consulted and Agree with Plan of Care: Patient  Annett Fabian, PT 10/04/2012, 2:10 PM  Physician Documentation Your signature is required to indicate approval of the treatment plan as stated above.  Please sign and either send electronically or make a copy of this report for your files and return this physician signed original.   Please mark one 1.__approve of plan  2. ___approve of plan with the following conditions.   ______________________________                                                          _____________________ Physician Signature                                                                                                              Date

## 2012-10-04 NOTE — Telephone Encounter (Signed)
Advised patient of doctor's reply/bsf

## 2012-10-08 ENCOUNTER — Encounter: Payer: Self-pay | Admitting: *Deleted

## 2012-10-10 ENCOUNTER — Ambulatory Visit (HOSPITAL_COMMUNITY): Payer: Self-pay

## 2012-10-11 ENCOUNTER — Ambulatory Visit (HOSPITAL_COMMUNITY): Payer: Self-pay | Admitting: Psychiatry

## 2012-10-14 ENCOUNTER — Emergency Department (HOSPITAL_COMMUNITY)
Admission: EM | Admit: 2012-10-14 | Discharge: 2012-10-14 | Disposition: A | Discharge status: Home / Self Care | Payer: Self-pay | Attending: Emergency Medicine | Admitting: Emergency Medicine

## 2012-10-14 ENCOUNTER — Encounter (HOSPITAL_COMMUNITY): Payer: Self-pay | Admitting: *Deleted

## 2012-10-14 DIAGNOSIS — Z79899 Other long term (current) drug therapy: Secondary | ICD-10-CM | POA: Insufficient documentation

## 2012-10-14 DIAGNOSIS — R509 Fever, unspecified: Secondary | ICD-10-CM | POA: Insufficient documentation

## 2012-10-14 DIAGNOSIS — Z8719 Personal history of other diseases of the digestive system: Secondary | ICD-10-CM | POA: Insufficient documentation

## 2012-10-14 DIAGNOSIS — E785 Hyperlipidemia, unspecified: Secondary | ICD-10-CM | POA: Insufficient documentation

## 2012-10-14 DIAGNOSIS — R5383 Other fatigue: Secondary | ICD-10-CM | POA: Insufficient documentation

## 2012-10-14 DIAGNOSIS — F17200 Nicotine dependence, unspecified, uncomplicated: Secondary | ICD-10-CM | POA: Insufficient documentation

## 2012-10-14 DIAGNOSIS — K529 Noninfective gastroenteritis and colitis, unspecified: Secondary | ICD-10-CM

## 2012-10-14 DIAGNOSIS — R5381 Other malaise: Secondary | ICD-10-CM | POA: Insufficient documentation

## 2012-10-14 DIAGNOSIS — F3289 Other specified depressive episodes: Secondary | ICD-10-CM | POA: Insufficient documentation

## 2012-10-14 DIAGNOSIS — E860 Dehydration: Secondary | ICD-10-CM | POA: Insufficient documentation

## 2012-10-14 DIAGNOSIS — J449 Chronic obstructive pulmonary disease, unspecified: Secondary | ICD-10-CM | POA: Insufficient documentation

## 2012-10-14 DIAGNOSIS — K219 Gastro-esophageal reflux disease without esophagitis: Secondary | ICD-10-CM | POA: Insufficient documentation

## 2012-10-14 DIAGNOSIS — F329 Major depressive disorder, single episode, unspecified: Secondary | ICD-10-CM | POA: Insufficient documentation

## 2012-10-14 DIAGNOSIS — K5289 Other specified noninfective gastroenteritis and colitis: Secondary | ICD-10-CM | POA: Insufficient documentation

## 2012-10-14 DIAGNOSIS — F410 Panic disorder [episodic paroxysmal anxiety] without agoraphobia: Secondary | ICD-10-CM | POA: Insufficient documentation

## 2012-10-14 DIAGNOSIS — J4489 Other specified chronic obstructive pulmonary disease: Secondary | ICD-10-CM | POA: Insufficient documentation

## 2012-10-14 DIAGNOSIS — E119 Type 2 diabetes mellitus without complications: Secondary | ICD-10-CM | POA: Insufficient documentation

## 2012-10-14 DIAGNOSIS — G8929 Other chronic pain: Secondary | ICD-10-CM | POA: Insufficient documentation

## 2012-10-14 DIAGNOSIS — I10 Essential (primary) hypertension: Secondary | ICD-10-CM | POA: Insufficient documentation

## 2012-10-14 DIAGNOSIS — IMO0002 Reserved for concepts with insufficient information to code with codable children: Secondary | ICD-10-CM | POA: Insufficient documentation

## 2012-10-14 DIAGNOSIS — Z7982 Long term (current) use of aspirin: Secondary | ICD-10-CM | POA: Insufficient documentation

## 2012-10-14 HISTORY — DX: Panic disorder (episodic paroxysmal anxiety): F41.0

## 2012-10-14 LAB — CBC WITH DIFFERENTIAL/PLATELET
Eosinophils Absolute: 0 10*3/uL (ref 0.0–0.7)
Eosinophils Relative: 0 % (ref 0–5)
Hemoglobin: 17.9 g/dL — ABNORMAL HIGH (ref 13.0–17.0)
Lymphs Abs: 0.8 10*3/uL (ref 0.7–4.0)
MCH: 31.1 pg (ref 26.0–34.0)
MCV: 88.2 fL (ref 78.0–100.0)
Monocytes Relative: 4 % (ref 3–12)
RBC: 5.75 MIL/uL (ref 4.22–5.81)

## 2012-10-14 LAB — GLUCOSE, CAPILLARY: Glucose-Capillary: 118 mg/dL — ABNORMAL HIGH (ref 70–99)

## 2012-10-14 LAB — COMPREHENSIVE METABOLIC PANEL
Alkaline Phosphatase: 70 U/L (ref 39–117)
BUN: 29 mg/dL — ABNORMAL HIGH (ref 6–23)
Calcium: 9.9 mg/dL (ref 8.4–10.5)
GFR calc Af Amer: 75 mL/min — ABNORMAL LOW (ref >90)
Glucose, Bld: 132 mg/dL — ABNORMAL HIGH (ref 70–99)
Total Protein: 8.3 g/dL (ref 6.0–8.3)

## 2012-10-14 LAB — URINALYSIS, ROUTINE W REFLEX MICROSCOPIC
Leukocytes, UA: NEGATIVE
Nitrite: NEGATIVE
Urobilinogen, UA: 0.2 mg/dL (ref 0.0–1.0)

## 2012-10-14 LAB — URINE MICROSCOPIC-ADD ON

## 2012-10-14 LAB — LIPASE, BLOOD: Lipase: 14 U/L (ref 11–59)

## 2012-10-14 MED ORDER — SODIUM CHLORIDE 0.9 % IV BOLUS (SEPSIS)
1000.0000 mL | Freq: Once | INTRAVENOUS | Status: AC
  Administered 2012-10-14: 1000 mL via INTRAVENOUS

## 2012-10-14 MED ORDER — ONDANSETRON HCL 4 MG/2ML IJ SOLN
4.0000 mg | Freq: Once | INTRAMUSCULAR | Status: AC
  Administered 2012-10-14: 4 mg via INTRAVENOUS
  Filled 2012-10-14: qty 2

## 2012-10-14 MED ORDER — KETOROLAC TROMETHAMINE 30 MG/ML IJ SOLN
30.0000 mg | Freq: Once | INTRAMUSCULAR | Status: AC
  Administered 2012-10-14: 30 mg via INTRAVENOUS
  Filled 2012-10-14: qty 1

## 2012-10-14 MED ORDER — ONDANSETRON HCL 8 MG PO TABS: 8.0000 mg | ORAL_TABLET | Freq: Three times a day (TID) | ORAL | Status: DC | PRN

## 2012-10-14 NOTE — ED Provider Notes (Signed)
History     CSN: [id number]  Arrival date & time 10/14/12  1545   First MD Initiated Contact with Patient 10/14/12 1634      Chief Complaint  Patient presents with  . Emesis    (Consider location/radiation/quality/duration/timing/severity/associated sxs/prior treatment) HPI Comments: Patient presents with complaints of n/v/d for the past two days.  He has been extremely weak and has felt dizzy on several occasions, nearly passing out.  He reports that he has been sick with a uri-like syndrome for the past 1 1/2 weeks and was on first clindamycin, then switched to bactrim and prednisone.    Patient is a 47 y.o. male presenting with vomiting. The history is provided by the patient.  Emesis Severity:  Moderate Duration:  2 days Timing:  Constant Quality:  Stomach contents Progression:  Worsening Chronicity:  New Relieved by:  None tried Worsened by:  Nothing tried Associated symptoms: no abdominal pain, no chills, no diarrhea and no fever     Past Medical History  Diagnosis Date  . Chest pain     + palpitations; cath 2005- 30-40% mid LAD, 20% D1, 20% cx, OM, 20-30% RCA, and EF-55%  . COPD (chronic obstructive pulmonary disease)   . GERD (gastroesophageal reflux disease)   . Hyperlipemia   . Hypertension   . Depression   . Colitis 1990  . Tobacco abuse     1/2 pack per day  . Diabetes mellitus   . Gastric ulcer 2003; 2012    2003: + esophagitis; negative H.pylori serology  2012: Dr. Darrick Penna, mild gastritis, Bravo PH probe placement, negative H.pylori  . Hepatic steatosis   . Chronic low back pain   . Panic attacks     Past Surgical History  Procedure Laterality Date  . Colonoscopy  1990  . Shoulder surgery      Right acromioclavicular joint arthrosis  . Neck mass excision    . Bravo ph study  05/03/2011    Mild gastritis/normal esophagus and duodenum  . Cardiac catheterization  2005    Family History  Problem Relation Age of Onset  . Lung cancer Mother   .  Alcohol abuse Mother   . Heart attack Father 13  . Diabetes Father   . Alcohol abuse Father   . Hypertension Brother   . Colon cancer Neg Hx   . Drug abuse Neg Hx   . Bipolar disorder Neg Hx   . OCD Neg Hx   . Paranoid behavior Neg Hx   . Schizophrenia Neg Hx   . Sexual abuse Neg Hx   . Physical abuse Neg Hx   . Hypertension Brother   . Anxiety disorder Sister   . Depression Sister   . Dementia Paternal Uncle   . Dementia Cousin   . Anxiety disorder Sister   . Heart attack Brother 38  . Diabetes Brother   . Hypertension Brother   . Seizures Brother   . ADD / ADHD Daughter     History  Substance Use Topics  . Smoking status: Current Every Day Smoker -- 0.50 packs/day for 29 years    Types: Cigarettes  . Smokeless tobacco: Never Used     Comment: on the verge of quitting  . Alcohol Use: No      Review of Systems  Constitutional: Positive for fever and fatigue. Negative for chills.  Gastrointestinal: Positive for vomiting. Negative for nausea, abdominal pain and diarrhea.  All other systems reviewed and are negative.    Allergies  Mushroom ext cmplx(shiitake-reishi-mait) and Penicillins  Home Medications   Current Outpatient Rx  Name  Route  Sig  Dispense  Refill  . acetaminophen (TYLENOL) 325 MG tablet   Oral   Take 650 mg by mouth every 6 (six) hours as needed. For pain         . albuterol (VENTOLIN HFA) 108 (90 BASE) MCG/ACT inhaler   Inhalation   Inhale 2 puffs into the lungs every 6 (six) hours as needed.         . ALPRAZolam (XANAX) 0.5 MG tablet   Oral   Take 0.5 mg by mouth at bedtime as needed.           [name] aspirin 81 MG tablet   Oral   Take 81 mg by mouth at bedtime.          [name] EXPIRED: cyclobenzaprine (FLEXERIL) 10 MG tablet   Oral   Take 1 tablet (10 mg total) by mouth 3 (three) times daily as needed for muscle spasms. For Muscle spasms   90 tablet   0   . DULoxetine (CYMBALTA) 30 MG capsule   Oral   Take 1 capsule (30 mg  total) by mouth daily.   30 capsule   2   . esomeprazole (NEXIUM) 40 MG capsule   Oral   Take 40 mg by mouth 2 (two) times daily.         . Fluticasone-Salmeterol (ADVAIR) 250-50 MCG/DOSE AEPB   Inhalation   Inhale 1 puff into the lungs every 12 (twelve) hours.         . gabapentin (NEURONTIN) 100 MG capsule   Oral   Take 1 capsule (100 mg total) by mouth 3 (three) times daily.   90 capsule   2   . HYDROcodone-acetaminophen (VICODIN) 5-500 MG per tablet   Oral   Take 1 tablet by mouth every 6 (six) hours as needed for pain.         . Liniments (ANALGESIC GRX BALM) OINT   Apply externally   Apply 1 g topically 4 (four) times daily.   30 g   3   . lisinopril (PRINIVIL,ZESTRIL) 20 MG tablet   Oral   Take 1 tablet (20 mg total) by mouth daily.   30 tablet   5   . lovastatin (MEVACOR) 20 MG tablet      TAKE ONE TABLET BY MOUTH EVERY DAY AT BEDTIME **REPLACES  LIPITOR**   30 tablet   5   . metFORMIN (GLUCOPHAGE) 500 MG tablet   Oral   Take 500 mg by mouth 2 (two) times daily with a meal.           . nitroGLYCERIN (NITROSTAT) 0.4 MG SL tablet   Sublingual   Place 0.4 mg under the tongue every 5 (five) minutes as needed.         . propranolol (INDERAL) 10 MG tablet   Oral   Take 1 tablet (10 mg total) by mouth 3 (three) times daily.   90 tablet   6     BP 108/65  Pulse 96  Temp(Src) 97.3 F (36.3 C) (Oral)  Resp 20  SpO2 97%  Physical Exam  Nursing note and vitals reviewed. Constitutional: He is oriented to person, place, and time. He appears well-developed and well-nourished. No distress.  HENT:  Head: Normocephalic and atraumatic.  Mouth/Throat: Oropharynx is clear and moist.  Neck: Normal range of motion. Neck supple.  Cardiovascular: Normal rate and regular rhythm.  No murmur heard. Pulmonary/Chest: Effort normal and breath sounds normal. No respiratory distress. He has no wheezes.  Abdominal: Soft. Bowel sounds are normal. He exhibits  no distension. There is no tenderness.  Musculoskeletal: Normal range of motion. He exhibits no edema.  Lymphadenopathy:    He has no cervical adenopathy.  Neurological: He is alert and oriented to person, place, and time.  Skin: Skin is warm and dry. He is not diaphoretic.    ED Course  Procedures (including critical care time)  Labs Reviewed  GLUCOSE, CAPILLARY - Abnormal; Notable for the following:    Glucose-Capillary 118 (*)    All other components within normal limits  CBC WITH DIFFERENTIAL  COMPREHENSIVE METABOLIC PANEL  LIPASE, BLOOD  URINALYSIS, ROUTINE W REFLEX MICROSCOPIC   No results found.   No diagnosis found.    MDM  The patient presents here with n/v/d for the past two days with a near-syncopal episode today.  The labs appear that he is dehydrated with a slight elevation of the BUN and Cr over his baseline.  He was hydrated with 2LNS and is feeling much better.  I suspect the cause of his symptoms is viral in nature.  He appears stable for discharge with zofran, increased fluids, return prn.        Geoffery Lyons, MD 10/14/12 [phone]

## 2012-10-14 NOTE — ED Notes (Signed)
Vomiting , onset this am.  Alert,  Diarrhea.  Has been sick with uri sx for 1 week prior.

## 2012-10-14 NOTE — ED Notes (Signed)
Pt c/o N/V/D that began earlier today. Pt also reports weakness and lightheadedness. Pt denies any pain and also denies any blood in vomit or stool. Pt states his grandchildren were at his house recently with same symptoms.

## 2012-10-15 ENCOUNTER — Ambulatory Visit (HOSPITAL_COMMUNITY): Payer: Self-pay | Admitting: Psychology

## 2012-10-21 ENCOUNTER — Ambulatory Visit (INDEPENDENT_AMBULATORY_CARE_PROVIDER_SITE_OTHER): Payer: Self-pay | Admitting: Psychology

## 2012-10-21 ENCOUNTER — Encounter (HOSPITAL_COMMUNITY): Payer: Self-pay | Admitting: Psychology

## 2012-10-21 DIAGNOSIS — F411 Generalized anxiety disorder: Secondary | ICD-10-CM

## 2012-10-21 DIAGNOSIS — F419 Anxiety disorder, unspecified: Secondary | ICD-10-CM

## 2012-10-21 DIAGNOSIS — F4001 Agoraphobia with panic disorder: Secondary | ICD-10-CM

## 2012-10-21 NOTE — Progress Notes (Signed)
Patient:  Richard [name]   DOB: [birth year]  MR Number: [id number]  Location: [facility] [address] [place] [address] Dept: [phone]  Start: 2 PM End: 3 PM  Provider/Observer:     Hershal Coria PSYD  Chief Complaint:      Chief Complaint  Patient presents with  . Panic Attack  . Anxiety    Reason For Service:    The patient was referred by Dr. Dietrich Pates because of severe anxiety and foot appear to be panic attacks. The patient reports that he has been having a lot of anxiety and panic attacks. He has numerous medical issues including severe issues with diabetes, COPD, fatty liver disease and orthopedic issues. He also has had a long history of depression anxiety as well. Anxiety and depression have been going on for least 5 years and correlate with his health issues and financial difficulties. The patient relates this deterioration to an injury at work where he injured both of his shoulders while pulling a pallet. He had surgery on one of his shoulders initially and he experiences around his injury and surgery began to develop panic attacks. The patient is also been diagnosed with COPD and diabetes but continues to smoke and fears that if he completely quit smoking that he will gain a lot of weight again.   Interventions Strategy:  Cognitive/behavioral psychotherapeutic interventions  Participation Level:   Active  Participation Quality:  Appropriate      Behavioral Observation:  Fairly Groomed, Alert, and Appropriate.   Current Psychosocial Factors: The patient reports that he continues to have a lot of anxiety and has had panic attacks. He reports that his pulmonary issues continue to be quite problematic for him. The patient reports that he has been avoiding a lot of various activities that he fears trigger panic attacks.  Content of Session:   Review current symptoms and continued  work on therapeutic interventions around numerous medical issues and panic attacks.  Current Status:   The patient reports that he has gone a couple of months now without caffeinated drinks. The patient also reports that he went more than 20 days without any cigarettes and now only has a couple of puffs of cigarettes from time to time and they go couple of days at a time with no cigarettes. However, he reports that he has not experienced any significant improvement in his breathing he continues to fear breathing attacks when the weather starts to warm. The patient reports that he has noticed improvement in his breathing in the morning.  Patient Progress:   Stable  Target Goals:   Target goals include reduce the intensity, duration, and frequency of panic attacks, reduce the amount of benzodiazepine or other potentially dependence forming medications.  Last Reviewed:   10/21/2012  Goals Addressed Today:    Today we worked on issues of reducing the intensity and frequency of panic attacks  Impression/Diagnosis:   the patient appears to be doing with panic attacks along with anxiety and depression. He has numerous medical issues including COPD, diabetes, and other issues as well as liver disease. The patient is not taking very good care of himself and it was stressed specifically that he is going to need to quit smoking cigarettes and completely stopped caffeinated and sugar drinks if he is going to have any hope of improving his quality of life. The patient was amenable to initially try to stop the caffeinated drinks and  we developed a plan for this. After that we will begin to address the smoking.    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

## 2012-10-23 ENCOUNTER — Encounter (HOSPITAL_COMMUNITY): Payer: Self-pay | Admitting: Psychology

## 2012-10-23 NOTE — Progress Notes (Signed)
Patient:  Richard [name]   DOB: [birth year]  MR Number: [id number]  Location: BEHAVIORAL [facility] [address] [place] [address] Dept: [phone]  Start: 4 PM End: 5 PM  Provider/Observer:     Hershal Coria PSYD  Chief Complaint:      Chief Complaint  Patient presents with  . Anxiety  . Panic Attack  . Stress  . Depression    Reason For Service:    The patient was referred by Dr. Dietrich Pates because of severe anxiety and foot appear to be panic attacks. The patient reports that he has been having a lot of anxiety and panic attacks. He has numerous medical issues including severe issues with diabetes, COPD, fatty liver disease and orthopedic issues. He also has had a long history of depression anxiety as well. Anxiety and depression have been going on for least 5 years and correlate with his health issues and financial difficulties. The patient relates this deterioration to an injury at work where he injured both of his shoulders while pulling a pallet. He had surgery on one of his shoulders initially and he experiences around his injury and surgery began to develop panic attacks. The patient is also been diagnosed with COPD and diabetes but continues to smoke and fears that if he completely quit smoking that he will gain a lot of weight again.   Interventions Strategy:  Cognitive/behavioral psychotherapeutic interventions  Participation Level:   Active  Participation Quality:  Appropriate      Behavioral Observation:  Fairly Groomed, Alert, and Appropriate.   Current Psychosocial Factors: T the patient continues to describe significant issues of panic attacks and anxiety as well as breathing issues. The patient reports that he has been working on coping skills but continues to be stressed by a lot of issues.  Content of Session:   Review current symptoms and continued work on therapeutic  interventions around numerous medical issues and panic attacks.  Current Status:   The patient reports that while he possible frequency of panic attacks have reduced and he has been actively working on behavioral interventions for panic attacks he is continuing to have those but more importantly continues to be quite terrified and afraid that he will have them.  Patient Progress:   Stable  Target Goals:   Target goals include reduce the intensity, duration, and frequency of panic attacks, reduce the amount of benzodiazepine or other potentially dependence forming medications.  Last Reviewed:   09/24/2012  Goals Addressed Today:    Today we worked on issues of reducing the intensity and frequency of panic attacks  Impression/Diagnosis:   the patient appears to be doing with panic attacks along with anxiety and depression. He has numerous medical issues including COPD, diabetes, and other issues as well as liver disease. The patient is not taking very good care of himself and it was stressed specifically that he is going to need to quit smoking cigarettes and completely stopped caffeinated and sugar drinks if he is going to have any hope of improving his quality of life. The patient was amenable to initially try to stop the caffeinated drinks and we developed a plan for this. After that we will begin to address the smoking.    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

## 2012-10-24 LAB — VITAMIN D 25 HYDROXY (VIT D DEFICIENCY, FRACTURES): Vit D, 25-Hydroxy: 41 ng/mL (ref 30–89)

## 2012-10-25 ENCOUNTER — Encounter (HOSPITAL_COMMUNITY): Payer: Self-pay | Admitting: Psychiatry

## 2012-10-25 ENCOUNTER — Ambulatory Visit (INDEPENDENT_AMBULATORY_CARE_PROVIDER_SITE_OTHER): Payer: Self-pay | Admitting: Psychiatry

## 2012-10-25 VITALS — Wt 207.2 lb

## 2012-10-25 DIAGNOSIS — F411 Generalized anxiety disorder: Secondary | ICD-10-CM

## 2012-10-25 DIAGNOSIS — F332 Major depressive disorder, recurrent severe without psychotic features: Secondary | ICD-10-CM

## 2012-10-25 DIAGNOSIS — M545 Low back pain: Secondary | ICD-10-CM

## 2012-10-25 DIAGNOSIS — F17200 Nicotine dependence, unspecified, uncomplicated: Secondary | ICD-10-CM

## 2012-10-25 DIAGNOSIS — F410 Panic disorder [episodic paroxysmal anxiety] without agoraphobia: Secondary | ICD-10-CM

## 2012-10-25 DIAGNOSIS — G969 Disorder of central nervous system, unspecified: Secondary | ICD-10-CM

## 2012-10-25 DIAGNOSIS — E559 Vitamin D deficiency, unspecified: Secondary | ICD-10-CM

## 2012-10-25 DIAGNOSIS — F419 Anxiety disorder, unspecified: Secondary | ICD-10-CM

## 2012-10-25 DIAGNOSIS — R002 Palpitations: Secondary | ICD-10-CM

## 2012-10-25 MED ORDER — DULOXETINE HCL 30 MG PO CPEP: 30.0000 mg | ORAL_CAPSULE | Freq: Every day | ORAL | Status: DC

## 2012-10-25 MED ORDER — ANALGESIC GRX BALM EX OINT: 1.0000 g | TOPICAL_OINTMENT | Freq: Four times a day (QID) | CUTANEOUS | Status: DC

## 2012-10-25 MED ORDER — CARBAMAZEPINE 200 MG PO TABS: ORAL_TABLET | ORAL | Status: DC
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
## 2012-10-25 NOTE — Patient Instructions (Signed)
For what brain health, it advised that you get  regular exercise, regular sleep, and  consume good quality, fish oil, 1000 mg twice a day. These 3 things are the foundation of rehabilitating your brain. Staying off all abusable substances including nicotine, caffeine, and refined sugar and avoiding further head injuries are the other important elements in helping you keep your brain working the best it can for you.  Spend time with the puppies several times a day just by yourself.  Take care of yourself.  No one else is standing up to do the job and only you know what you need.   GET SERIOUS about taking care of yourself.  Do the next right thing and that often means doing something to care for yourself along the lines of are you hungry, are you angry, are you lonely, are you tired, are you scared?  [employer] is what that stands for.  Call if problems or concerns.

## 2012-10-28 ENCOUNTER — Telehealth: Payer: Self-pay | Admitting: Cardiology

## 2012-10-28 NOTE — Telephone Encounter (Signed)
PT WANTS TO KNOW IF HE CAN TAKE 1/2 TAB OF PROPRANOLOL WITH HIS TOPROL, STATES THAT BEHAVIOR HEALTH DOCTOR SAID IT WOULD BE OKAY.

## 2012-10-28 NOTE — Telephone Encounter (Signed)
Please advise 

## 2012-10-31 ENCOUNTER — Telehealth: Payer: Self-pay | Admitting: Cardiology

## 2012-10-31 ENCOUNTER — Other Ambulatory Visit: Payer: Self-pay | Admitting: Adult Health

## 2012-10-31 MED ORDER — METOPROLOL SUCCINATE ER 50 MG PO TB24: 75.0000 mg | ORAL_TABLET | Freq: Every day | ORAL | Status: DC

## 2012-10-31 MED ORDER — METOPROLOL SUCCINATE ER 50 MG PO TB24: ORAL_TABLET | ORAL | Status: DC

## 2012-10-31 NOTE — Telephone Encounter (Signed)
Correct dose of 75 mg daily called to [facility]

## 2012-10-31 NOTE — Telephone Encounter (Signed)
Should not cause a problem

## 2012-10-31 NOTE — Telephone Encounter (Signed)
Patient made aware of recomendation

## 2012-10-31 NOTE — Telephone Encounter (Signed)
PT STATES PHARMACY HAS RECEIVED DIFFERENT DOSES ON METOPROLOL AND NEEDS CLARIFICATION

## 2012-11-05 ENCOUNTER — Ambulatory Visit (INDEPENDENT_AMBULATORY_CARE_PROVIDER_SITE_OTHER): Payer: Self-pay | Admitting: Psychology

## 2012-11-05 DIAGNOSIS — F411 Generalized anxiety disorder: Secondary | ICD-10-CM

## 2012-11-05 DIAGNOSIS — F4001 Agoraphobia with panic disorder: Secondary | ICD-10-CM

## 2012-11-05 DIAGNOSIS — F419 Anxiety disorder, unspecified: Secondary | ICD-10-CM

## 2012-11-18 ENCOUNTER — Ambulatory Visit (HOSPITAL_COMMUNITY): Payer: Self-pay | Admitting: Psychiatry

## 2012-11-18 ENCOUNTER — Encounter: Payer: Self-pay | Admitting: Gastroenterology

## 2012-11-18 ENCOUNTER — Ambulatory Visit (INDEPENDENT_AMBULATORY_CARE_PROVIDER_SITE_OTHER): Payer: Self-pay | Admitting: Gastroenterology

## 2012-11-18 VITALS — BP 114/68 | HR 58 | Temp 97.4°F | Ht 71.0 in | Wt 206.4 lb

## 2012-11-18 DIAGNOSIS — R0789 Other chest pain: Secondary | ICD-10-CM | POA: Insufficient documentation

## 2012-11-18 DIAGNOSIS — R1013 Epigastric pain: Secondary | ICD-10-CM

## 2012-11-18 DIAGNOSIS — G8929 Other chronic pain: Secondary | ICD-10-CM | POA: Insufficient documentation

## 2012-11-18 DIAGNOSIS — K7689 Other specified diseases of liver: Secondary | ICD-10-CM

## 2012-11-18 DIAGNOSIS — K219 Gastro-esophageal reflux disease without esophagitis: Secondary | ICD-10-CM

## 2012-11-18 DIAGNOSIS — K760 Fatty (change of) liver, not elsewhere classified: Secondary | ICD-10-CM

## 2012-11-18 NOTE — Assessment & Plan Note (Signed)
Typical symptoms well-controlled on Nexium once to twice daily. Continue current regimen including antireflux measures as discussed.

## 2012-11-18 NOTE — Progress Notes (Addendum)
Primary Care Physician: Provider Not In System  Primary Gastroenterologist:  Jonette Eva, MD   Chief Complaint  Patient presents with  . Follow-up    HPI: Richard [name] is a 47 y.o. male here for followup of nonulcer dyspepsia, GERD. Last seen in July 2013. Takes Nexium 40mg  once to twice a day. Sharp pain pp in substernal area for couple of months. Definite postprandial component. Flutters in chest noticed it more when laying down but can also happen with meals. No heartburn. Stays away from acidic food. Seems to have more nocturnal symptoms, but will happen in mornings too. Triggers panic attack. No dysphagia. Cannot tolerate onions. Stays away from spicey foods. Add TUMS and finally helps. Apples and chocolate milk causes diarrhea. Quit [employer], case a day before. Weight down 15 pounds with dietary changes. States he has worn Holter monitor for one week but did not have the "flutter" during that perior of time. C/o significant anxiety/panic attacks. Seeing psychiatrist. Applied for disability.   Current Outpatient Prescriptions  Medication Sig Dispense Refill  . acetaminophen (TYLENOL) 325 MG tablet Take 650 mg by mouth every 6 (six) hours as needed for pain or fever. For pain      . albuterol (VENTOLIN HFA) 108 (90 BASE) MCG/ACT inhaler Inhale 2 puffs into the lungs every 6 (six) hours as needed for wheezing or shortness of breath.       . ALPRAZolam (XANAX) 0.5 MG tablet Take 0.5 mg by mouth 2 (two) times daily as needed for sleep or anxiety.       [name] aspirin 81 MG tablet Take 81 mg by mouth at bedtime.       [name] esomeprazole (NEXIUM) 40 MG capsule Take 40 mg by mouth 2 (two) times daily.      . Fluticasone-Salmeterol (ADVAIR) 250-50 MCG/DOSE AEPB Inhale 1 puff into the lungs every 12 (twelve) hours.      [name] HYDROcodone-acetaminophen (NORCO/VICODIN) 5-325 MG per tablet Take 1 tablet by mouth every 6 (six) hours as needed for pain.      [name] lisinopril (PRINIVIL,ZESTRIL) 20 MG tablet Take 20  mg by mouth at bedtime.      . lovastatin (MEVACOR) 20 MG tablet Take 20 mg by mouth at bedtime.      . metFORMIN (GLUCOPHAGE) 500 MG tablet Take 500 mg by mouth 2 (two) times daily with a meal.        . metoprolol succinate (TOPROL-XL) 50 MG 24 hr tablet Take 1.5 tablets = 75mg  daily  30 tablet  6  . nitroGLYCERIN (NITROSTAT) 0.4 MG SL tablet Place 0.4 mg under the tongue every 5 (five) minutes as needed.      . ondansetron (ZOFRAN) 8 MG tablet Take 1 tablet (8 mg total) by mouth every 8 (eight) hours as needed for nausea.  5 tablet  0   No current facility-administered medications for this visit.    Allergies as of 11/18/2012 - Review Complete 11/18/2012  Allergen Reaction Noted  . Mushroom ext cmplx(shiitake-reishi-mait) Anaphylaxis 03/22/2011  . Penicillins     Past Medical History  Diagnosis Date  . Chest pain     + palpitations; cath 2005- 30-40% mid LAD, 20% D1, 20% cx, OM, 20-30% RCA, and EF-55%  . COPD (chronic obstructive pulmonary disease)   . GERD (gastroesophageal reflux disease)   . Hyperlipemia   . Hypertension   . Depression   . Colitis 1990  . Tobacco abuse     1/2 pack per day  .  Diabetes mellitus   . Gastric ulcer 2003; 2012    2003: + esophagitis; negative H.pylori serology  2012: Dr. Darrick Penna, mild gastritis, Bravo PH probe placement, negative H.pylori  . Hepatic steatosis   . Chronic low back pain   . Panic attacks    Past Surgical History  Procedure Laterality Date  . Colonoscopy  1990  . Shoulder surgery      Right acromioclavicular joint arthrosis  . Neck mass excision    . Bravo ph study  05/03/2011    Mild gastritis/normal esophagus and duodenum  . Cardiac catheterization  2005     ROS:  General: Negative for anorexia, weight loss, fever, chills, fatigue, weakness. ENT: Negative for hoarseness, difficulty swallowing , nasal congestion. CV: Negative for chest pain, angina, palpitations, dyspnea on exertion, peripheral edema.  Respiratory:  Negative for dyspnea at rest, dyspnea on exertion, cough, sputum, wheezing.  GI: See history of present illness. GU:  Negative for dysuria, hematuria, urinary incontinence, urinary frequency, nocturnal urination.  Endo: Negative for unusual weight change.    Physical Examination:   BP 114/68  Pulse 58  Temp(Src) 97.4 F (36.3 C) (Oral)  Ht 5\' 11"  (1.803 m)  Wt 206 lb 6.4 oz (93.622 kg)  BMI 28.8 kg/m2  General: Well-nourished, well-developed in no acute distress.  Eyes: No icterus. Mouth: Oropharyngeal mucosa moist and pink , no lesions erythema or exudate. Lungs: Clear to auscultation bilaterally.  Heart: Regular rate and rhythm, no murmurs rubs or gallops.  Abdomen: Bowel sounds are normal, nontender, nondistended, no hepatosplenomegaly or masses, no abdominal bruits or hernia , no rebound or guarding.   Extremities: No lower extremity edema. No clubbing or deformities. Neuro: Alert and oriented x 4   Skin: Warm and dry, no jaundice.   Psych: Alert and cooperative, normal mood and affect.  Labs:  Requested labs from [facility]. Done 10/23/12 White blood cell count 8200, hemoglobin 15.4, platelets 292,000, sodium 137, potassium 4.3, glucose 131, BUN 15, creatinine 0.91, total bilirubin 0.4, alkaline phosphatase 45, AST 11, ALT 29, albumin 4, hemoglobin A1c 6.9.  Imaging Studies: No results found.

## 2012-11-18 NOTE — Assessment & Plan Note (Signed)
Postprandial epigastric/substernal chest pain. ?ongoing dyspepsia vs refractory GERD vs biliary. Abd u/s in near future. Continue Nexium. Avoid spicy/greasy foods. Further recommendations to follow.

## 2012-11-18 NOTE — Assessment & Plan Note (Addendum)
LFTs are normal. Continue yearly LFTs.  Instructions for fatty liver: Recommend 1-2# weight loss per week until ideal body weight through exercise & diet. Low fat/cholesterol diet.   Avoid sweets, sodas, fruit juices, sweetened beverages like tea, etc. Gradually increase exercise from 15 min daily up to 1 hr per day 5 days/week. Limit alcohol use.

## 2012-11-18 NOTE — Patient Instructions (Addendum)
1. Abdominal ultrasound has been scheduled. We will call you with results. 2. We will request a copy of your blood work from the [facility].

## 2012-11-19 NOTE — Progress Notes (Signed)
[facility] 

## 2012-11-20 ENCOUNTER — Ambulatory Visit (HOSPITAL_COMMUNITY)
Admission: RE | Admit: 2012-11-20 | Discharge: 2012-11-20 | Disposition: A | Discharge status: Home / Self Care | Payer: Self-pay | Source: Ambulatory Visit | Attending: Gastroenterology | Admitting: Gastroenterology

## 2012-11-20 DIAGNOSIS — K760 Fatty (change of) liver, not elsewhere classified: Secondary | ICD-10-CM

## 2012-11-20 DIAGNOSIS — G8929 Other chronic pain: Secondary | ICD-10-CM

## 2012-11-20 DIAGNOSIS — K7689 Other specified diseases of liver: Secondary | ICD-10-CM | POA: Insufficient documentation

## 2012-11-20 DIAGNOSIS — R109 Unspecified abdominal pain: Secondary | ICD-10-CM | POA: Insufficient documentation

## 2012-11-20 LAB — COMPREHENSIVE METABOLIC PANEL
Albumin: 4
Alkaline Phosphatase: 45 U/L
CO2: 25 mmol/L
Calcium: 9 mg/dL
Glucose: 131
Sodium: 137 mmol/L (ref 137–147)
WBC: 8.2
platelet count: 292

## 2012-11-21 ENCOUNTER — Ambulatory Visit (INDEPENDENT_AMBULATORY_CARE_PROVIDER_SITE_OTHER): Payer: Self-pay | Admitting: Psychology

## 2012-11-21 ENCOUNTER — Telehealth (HOSPITAL_COMMUNITY): Payer: Self-pay | Admitting: Psychiatry

## 2012-11-21 DIAGNOSIS — F411 Generalized anxiety disorder: Secondary | ICD-10-CM

## 2012-11-21 DIAGNOSIS — F4001 Agoraphobia with panic disorder: Secondary | ICD-10-CM

## 2012-11-21 DIAGNOSIS — F419 Anxiety disorder, unspecified: Secondary | ICD-10-CM

## 2012-11-21 DIAGNOSIS — F329 Major depressive disorder, single episode, unspecified: Secondary | ICD-10-CM

## 2012-11-21 MED ORDER — FLUOXETINE HCL 10 MG PO TABS: 5.0000 mg | ORAL_TABLET | Freq: Every day | ORAL | Status: DC

## 2012-11-21 NOTE — Telephone Encounter (Signed)
Pt seen by therapist and wondered if Prozac might be better for him.  He has failed on Teg, Cymb, Neurontin, and several other things. Will try low dose Prozac.

## 2012-11-22 ENCOUNTER — Telehealth: Payer: Self-pay | Admitting: Gastroenterology

## 2012-11-22 ENCOUNTER — Encounter (HOSPITAL_COMMUNITY): Payer: Self-pay | Admitting: Psychology

## 2012-11-22 NOTE — Telephone Encounter (Signed)
Pt's wife called to check on the Gall Bladder U/S results. Please call him at [phone]

## 2012-11-22 NOTE — Telephone Encounter (Signed)
Forwarding to Leslie Lewis, PA.  

## 2012-11-22 NOTE — Progress Notes (Signed)
Patient:  Richard [name]   DOB: 05/20/1966  MR Number: [id number]  Location: [facility] [address] [place] [address] Dept: [phone]  Start: 2 PM End: 3 PM  Provider/Observer:     Hershal Coria PSYD  Chief Complaint:      Chief Complaint  Patient presents with  . Anxiety  . Panic Attack  . Stress    Reason For Service:    The patient was referred by Dr. Dietrich Pates because of severe anxiety and foot appear to be panic attacks. The patient reports that he has been having a lot of anxiety and panic attacks. He has numerous medical issues including severe issues with diabetes, COPD, fatty liver disease and orthopedic issues. He also has had a long history of depression anxiety as well. Anxiety and depression have been going on for least 5 years and correlate with his health issues and financial difficulties. The patient relates this deterioration to an injury at work where he injured both of his shoulders while pulling a pallet. He had surgery on one of his shoulders initially and he experiences around his injury and surgery began to develop panic attacks. The patient is also been diagnosed with COPD and diabetes but continues to smoke and fears that if he completely quit smoking that he will gain a lot of weight again.   Interventions Strategy:  Cognitive/behavioral psychotherapeutic interventions  Participation Level:   Active  Participation Quality:  Appropriate      Behavioral Observation:  Fairly Groomed, Alert, and Appropriate.   Current Psychosocial Factors: The patient reports that his panic attacks and is anxiety continue to be significantly limiting to him. He reports that his anticipation of warmer weather is caused him increased anxiety and worry as he knows that when it is hot outside that he tends to have increasing difficulties breathing.  Content of Session:   Review  current symptoms and continued work on therapeutic interventions around numerous medical issues and panic attacks.  Current Status:   The patient reports that he has gone a couple of months now without caffeinated drinks. The patient also reports that he went more than 20 days without any cigarettes and now only has a couple of puffs of cigarettes from time to time and they go couple of days at a time with no cigarettes. However, he reports that he has not experienced any significant improvement in his breathing he continues to fear breathing attacks when the weather starts to warm. The patient reports that he has noticed improvement in his breathing in the morning.  Patient Progress:   Stable  Target Goals:   Target goals include reduce the intensity, duration, and frequency of panic attacks, reduce the amount of benzodiazepine or other potentially dependence forming medications.  Last Reviewed:   11/05/2012  Goals Addressed Today:    Today we worked on issues of reducing the intensity and frequency of panic attacks  Impression/Diagnosis:   the patient appears to be doing with panic attacks along with anxiety and depression. He has numerous medical issues including COPD, diabetes, and other issues as well as liver disease. The patient is not taking very good care of himself and it was stressed specifically that he is going to need to quit smoking cigarettes and completely stopped caffeinated and sugar drinks if he is going to have any hope of improving his quality of life. The patient was amenable to initially try to stop the  caffeinated drinks and we developed a plan for this. After that we will begin to address the smoking.    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

## 2012-11-25 ENCOUNTER — Telehealth: Payer: Self-pay | Admitting: Internal Medicine

## 2012-11-25 ENCOUNTER — Other Ambulatory Visit: Payer: Self-pay | Admitting: Adult Health

## 2012-11-25 ENCOUNTER — Other Ambulatory Visit: Payer: Self-pay | Admitting: Gastroenterology

## 2012-11-25 ENCOUNTER — Encounter (HOSPITAL_COMMUNITY): Payer: Self-pay

## 2012-11-25 ENCOUNTER — Emergency Department (HOSPITAL_COMMUNITY): Payer: Self-pay

## 2012-11-25 ENCOUNTER — Emergency Department (HOSPITAL_COMMUNITY)
Admission: EM | Admit: 2012-11-25 | Discharge: 2012-11-25 | Disposition: A | Discharge status: Home / Self Care | Payer: Self-pay | Attending: Emergency Medicine | Admitting: Emergency Medicine

## 2012-11-25 DIAGNOSIS — K219 Gastro-esophageal reflux disease without esophagitis: Secondary | ICD-10-CM | POA: Insufficient documentation

## 2012-11-25 DIAGNOSIS — F410 Panic disorder [episodic paroxysmal anxiety] without agoraphobia: Secondary | ICD-10-CM | POA: Insufficient documentation

## 2012-11-25 DIAGNOSIS — M545 Low back pain, unspecified: Secondary | ICD-10-CM | POA: Insufficient documentation

## 2012-11-25 DIAGNOSIS — Z7982 Long term (current) use of aspirin: Secondary | ICD-10-CM | POA: Insufficient documentation

## 2012-11-25 DIAGNOSIS — E785 Hyperlipidemia, unspecified: Secondary | ICD-10-CM | POA: Insufficient documentation

## 2012-11-25 DIAGNOSIS — Z880 Allergy status to penicillin: Secondary | ICD-10-CM | POA: Insufficient documentation

## 2012-11-25 DIAGNOSIS — R002 Palpitations: Secondary | ICD-10-CM | POA: Insufficient documentation

## 2012-11-25 DIAGNOSIS — R51 Headache: Secondary | ICD-10-CM | POA: Insufficient documentation

## 2012-11-25 DIAGNOSIS — F17200 Nicotine dependence, unspecified, uncomplicated: Secondary | ICD-10-CM | POA: Insufficient documentation

## 2012-11-25 DIAGNOSIS — F3289 Other specified depressive episodes: Secondary | ICD-10-CM | POA: Insufficient documentation

## 2012-11-25 DIAGNOSIS — R5381 Other malaise: Secondary | ICD-10-CM | POA: Insufficient documentation

## 2012-11-25 DIAGNOSIS — Z9861 Coronary angioplasty status: Secondary | ICD-10-CM | POA: Insufficient documentation

## 2012-11-25 DIAGNOSIS — R1011 Right upper quadrant pain: Secondary | ICD-10-CM

## 2012-11-25 DIAGNOSIS — Z79899 Other long term (current) drug therapy: Secondary | ICD-10-CM | POA: Insufficient documentation

## 2012-11-25 DIAGNOSIS — I10 Essential (primary) hypertension: Secondary | ICD-10-CM | POA: Insufficient documentation

## 2012-11-25 DIAGNOSIS — G8929 Other chronic pain: Secondary | ICD-10-CM | POA: Insufficient documentation

## 2012-11-25 DIAGNOSIS — E876 Hypokalemia: Secondary | ICD-10-CM | POA: Insufficient documentation

## 2012-11-25 DIAGNOSIS — R300 Dysuria: Secondary | ICD-10-CM | POA: Insufficient documentation

## 2012-11-25 DIAGNOSIS — F329 Major depressive disorder, single episode, unspecified: Secondary | ICD-10-CM | POA: Insufficient documentation

## 2012-11-25 DIAGNOSIS — J4489 Other specified chronic obstructive pulmonary disease: Secondary | ICD-10-CM | POA: Insufficient documentation

## 2012-11-25 DIAGNOSIS — J449 Chronic obstructive pulmonary disease, unspecified: Secondary | ICD-10-CM | POA: Insufficient documentation

## 2012-11-25 DIAGNOSIS — R42 Dizziness and giddiness: Secondary | ICD-10-CM | POA: Insufficient documentation

## 2012-11-25 DIAGNOSIS — Z8719 Personal history of other diseases of the digestive system: Secondary | ICD-10-CM | POA: Insufficient documentation

## 2012-11-25 DIAGNOSIS — R079 Chest pain, unspecified: Secondary | ICD-10-CM | POA: Insufficient documentation

## 2012-11-25 DIAGNOSIS — E119 Type 2 diabetes mellitus without complications: Secondary | ICD-10-CM | POA: Insufficient documentation

## 2012-11-25 LAB — CBC WITH DIFFERENTIAL/PLATELET
Basophils Absolute: 0 10*3/uL (ref 0.0–0.1)
Basophils Relative: 0 % (ref 0–1)
Eosinophils Absolute: 0.2 10*3/uL (ref 0.0–0.7)
Eosinophils Relative: 2 % (ref 0–5)
Lymphocytes Relative: 56 % — ABNORMAL HIGH (ref 12–46)
MCV: 87.4 fL (ref 78.0–100.0)
Platelets: 230 10*3/uL (ref 150–400)
RDW: 12.8 % (ref 11.5–15.5)
WBC: 9.3 10*3/uL (ref 4.0–10.5)

## 2012-11-25 LAB — BASIC METABOLIC PANEL
CO2: 25 mEq/L (ref 19–32)
Calcium: 9.4 mg/dL (ref 8.4–10.5)
GFR calc Af Amer: >90 mL/min (ref >90)
GFR calc non Af Amer: 87 mL/min — ABNORMAL LOW (ref >90)
Sodium: 135 mEq/L (ref 135–145)

## 2012-11-25 MED ORDER — POTASSIUM CHLORIDE CRYS ER 20 MEQ PO TBCR: 20.0000 meq | EXTENDED_RELEASE_TABLET | Freq: Two times a day (BID) | ORAL | Status: DC

## 2012-11-25 MED ORDER — POTASSIUM CHLORIDE CRYS ER 20 MEQ PO TBCR
40.0000 meq | EXTENDED_RELEASE_TABLET | Freq: Once | ORAL | Status: AC
  Administered 2012-11-25: 40 meq via ORAL
  Filled 2012-11-25: qty 2

## 2012-11-25 NOTE — Telephone Encounter (Signed)
Disregard pain management noted on wrong patient

## 2012-11-25 NOTE — Telephone Encounter (Signed)
See result note.  

## 2012-11-25 NOTE — ED Provider Notes (Signed)
History     CSN: [id number]  Arrival date & time 11/25/12  0102   First MD Initiated Contact with Patient 11/25/12 0229      Chief Complaint  Patient presents with  . Chest Pain    (Consider location/radiation/quality/duration/timing/severity/associated sxs/prior treatment) The history is provided by the patient.   patient is a 47 year old male presents with constant chest pain since Friday that would be 3 days ago. The chest pain is 5-7/10 nonradiating not associated with shortness of breath nausea or vomiting. Patient also has had some mild headache a little bit of dizziness symptoms. And some fatigue. Patient is followed by cardiology he does not have any stents. Patient is also followed by GI medicine has been concerns that perhaps he has reflux disease as well. Patient also has a history of panic attacks but this was not like that. Chest pain is described as sharp.  Past Medical History  Diagnosis Date  . Chest pain     + palpitations; cath 2005- 30-40% mid LAD, 20% D1, 20% cx, OM, 20-30% RCA, and EF-55%  . COPD (chronic obstructive pulmonary disease)   . GERD (gastroesophageal reflux disease)   . Hyperlipemia   . Hypertension   . Depression   . Colitis 1990  . Tobacco abuse     1/2 pack per day  . Diabetes mellitus   . Gastric ulcer 2003; 2012    2003: + esophagitis; negative H.pylori serology  2012: Dr. Darrick Penna, mild gastritis, Bravo PH probe placement, negative H.pylori  . Hepatic steatosis   . Chronic low back pain   . Panic attacks     Past Surgical History  Procedure Laterality Date  . Colonoscopy  1990  . Shoulder surgery      Right acromioclavicular joint arthrosis  . Neck mass excision    . Bravo ph study  05/03/2011    Mild gastritis/normal esophagus and duodenum  . Cardiac catheterization  2005    Family History  Problem Relation Age of Onset  . Lung cancer Mother   . Alcohol abuse Mother   . Heart attack Father 26  . Diabetes Father   . Alcohol  abuse Father   . Hypertension Brother   . Colon cancer Neg Hx   . Drug abuse Neg Hx   . Bipolar disorder Neg Hx   . OCD Neg Hx   . Paranoid behavior Neg Hx   . Schizophrenia Neg Hx   . Sexual abuse Neg Hx   . Physical abuse Neg Hx   . Hypertension Brother   . Anxiety disorder Sister   . Depression Sister   . Dementia Paternal Uncle   . Dementia Cousin   . Anxiety disorder Sister   . Heart attack Brother 38  . Diabetes Brother   . Hypertension Brother   . Seizures Brother   . ADD / ADHD Daughter     History  Substance Use Topics  . Smoking status: Current Every Day Smoker -- 0.50 packs/day for 29 years    Types: Cigarettes  . Smokeless tobacco: Never Used     Comment: on the verge of quitting  . Alcohol Use: No      Review of Systems  Constitutional: Positive for fatigue. Negative for fever.  HENT: Negative for congestion.   Eyes: Negative for redness.  Respiratory: Negative for shortness of breath.   Cardiovascular: Positive for chest pain and palpitations.  Gastrointestinal: Negative for nausea, vomiting and abdominal pain.  Genitourinary: Positive for dysuria.  Musculoskeletal: Negative for back pain.  Skin: Negative for rash.  Neurological: Positive for dizziness and headaches.  Hematological: Does not bruise/bleed easily.  Psychiatric/Behavioral: Negative for confusion.    Allergies  Mushroom ext cmplx(shiitake-reishi-mait) and Penicillins  Home Medications   Current Outpatient Rx  Name  Route  Sig  Dispense  Refill  . acetaminophen (TYLENOL) 325 MG tablet   Oral   Take 650 mg by mouth every 6 (six) hours as needed for pain or fever. For pain         . albuterol (VENTOLIN HFA) 108 (90 BASE) MCG/ACT inhaler   Inhalation   Inhale 2 puffs into the lungs every 6 (six) hours as needed for wheezing or shortness of breath.          . ALPRAZolam (XANAX) 0.5 MG tablet   Oral   Take 0.5 mg by mouth 2 (two) times daily as needed for sleep or anxiety.           [name] aspirin 81 MG tablet   Oral   Take 81 mg by mouth at bedtime.          [name] esomeprazole (NEXIUM) 40 MG capsule   Oral   Take 40 mg by mouth 2 (two) times daily.         [name] FLUoxetine (PROZAC) 10 MG tablet   Oral   Take 0.5-1 tablets (5-10 mg total) by mouth daily.   30 tablet   0   . Fluticasone-Salmeterol (ADVAIR) 250-50 MCG/DOSE AEPB   Inhalation   Inhale 1 puff into the lungs every 12 (twelve) hours.         [name] HYDROcodone-acetaminophen (NORCO/VICODIN) 5-325 MG per tablet   Oral   Take 1 tablet by mouth every 6 (six) hours as needed for pain.         [name] lisinopril (PRINIVIL,ZESTRIL) 20 MG tablet   Oral   Take 20 mg by mouth at bedtime.         . lovastatin (MEVACOR) 20 MG tablet   Oral   Take 20 mg by mouth at bedtime.         . metFORMIN (GLUCOPHAGE) 500 MG tablet   Oral   Take 500 mg by mouth 2 (two) times daily with a meal.           . metoprolol succinate (TOPROL-XL) 50 MG 24 hr tablet      Take 1.5 tablets = 75mg  daily   30 tablet   6   . nitroGLYCERIN (NITROSTAT) 0.4 MG SL tablet   Sublingual   Place 0.4 mg under the tongue every 5 (five) minutes as needed.         . ondansetron (ZOFRAN) 8 MG tablet   Oral   Take 1 tablet (8 mg total) by mouth every 8 (eight) hours as needed for nausea.   5 tablet   0   . potassium chloride SA (K-DUR,KLOR-CON) 20 MEQ tablet   Oral   Take 1 tablet (20 mEq total) by mouth 2 (two) times daily.   5 tablet   0     BP 121/70  Pulse 58  Temp(Src) 97.7 F (36.5 C) (Oral)  Resp 14  SpO2 99%  Physical Exam  Nursing note and vitals reviewed. Constitutional: He is oriented to person, place, and time. He appears well-developed and well-nourished.  HENT:  Head: Normocephalic and atraumatic.  Eyes: Conjunctivae and EOM are normal. Pupils are equal, round, and reactive to light.  Neck: Normal range  of motion.  Cardiovascular: Normal rate, regular rhythm and normal heart sounds.   No murmur  heard. Pulmonary/Chest: Effort normal and breath sounds normal.  Abdominal: Soft. Bowel sounds are normal. There is no tenderness.  Musculoskeletal: Normal range of motion.  Neurological: He is alert and oriented to person, place, and time. No cranial nerve deficit. He exhibits normal muscle tone. Coordination normal.  Skin: Skin is warm. No rash noted.    ED Course  Procedures (including critical care time)  Labs Reviewed  CBC WITH DIFFERENTIAL - Abnormal; Notable for the following:    Neutrophils Relative 36 (*)    Lymphocytes Relative 56 (*)    Lymphs Abs 5.2 (*)    All other components within normal limits  BASIC METABOLIC PANEL - Abnormal; Notable for the following:    Potassium 3.0 (*)    Glucose, Bld 114 (*)    GFR calc non Af Amer 87 (*)    All other components within normal limits  TROPONIN I   Dg Chest 2 View  11/25/2012  *RADIOLOGY REPORT*  Clinical Data: Chest pain  CHEST - 2 VIEW  Comparison: 01/08/2012  Findings: Cardiomediastinal contours are stable, within normal limits.  No confluent airspace opacity.  No pleural effusion or pneumothorax.  No acute osseous finding.  IMPRESSION: No radiographic evidence of acute cardiopulmonary process.   Original Report Authenticated By: Jearld Lesch, M.D.    Results for orders placed during the hospital encounter of 11/25/12  TROPONIN I      Result Value Range   Troponin I <0.30  <0.30 ng/mL  CBC WITH DIFFERENTIAL      Result Value Range   WBC 9.3  4.0 - 10.5 K/uL   RBC 4.92  4.22 - 5.81 MIL/uL   Hemoglobin 15.5  13.0 - 17.0 g/dL   HCT 82.9  56.2 - 13.0 %   MCV 87.4  78.0 - 100.0 fL   MCH 31.5  26.0 - 34.0 pg   MCHC 36.0  30.0 - 36.0 g/dL   RDW 86.5  78.4 - 69.6 %   Platelets 230  150 - 400 K/uL   Neutrophils Relative 36 (*) 43 - 77 %   Neutro Abs 3.4  1.7 - 7.7 K/uL   Lymphocytes Relative 56 (*) 12 - 46 %   Lymphs Abs 5.2 (*) 0.7 - 4.0 K/uL   Monocytes Relative 6  3 - 12 %   Monocytes Absolute 0.6  0.1 - 1.0 K/uL    Eosinophils Relative 2  0 - 5 %   Eosinophils Absolute 0.2  0.0 - 0.7 K/uL   Basophils Relative 0  0 - 1 %   Basophils Absolute 0.0  0.0 - 0.1 K/uL  BASIC METABOLIC PANEL      Result Value Range   Sodium 135  135 - 145 mEq/L   Potassium 3.0 (*) 3.5 - 5.1 mEq/L   Chloride 97  96 - 112 mEq/L   CO2 25  19 - 32 mEq/L   Glucose, Bld 114 (*) 70 - 99 mg/dL   BUN 17  6 - 23 mg/dL   Creatinine, Ser 2.95  0.50 - 1.35 mg/dL   Calcium 9.4  8.4 - 28.4 mg/dL   GFR calc non Af Amer 87 (*) >90 mL/min   GFR calc Af Amer >90  >90 mL/min    Date: 11/25/2012  Rate: 84  Rhythm: normal sinus rhythm  QRS Axis: normal  Intervals: normal  ST/T Wave abnormalities: normal  Conduction  Disutrbances:none  Narrative Interpretation:   Old EKG Reviewed: unchanged No significant change in EKG compared to 01/08/2012    1. Chest pain   2. Hypokalemia       MDM  Patient with 3 day history of chest pain started on Friday EKG without acute cardiac changes troponin was negative. Chest x-rays negative for pneumonia pulmonary edema or pneumothorax. Patient's renal function was normal but potassium was low could be the explanation of his pain. He said his had similar symptoms when his potassium is below before. Patient given 40 mEq of potassium by mouth in the emergency department. Patient will be discharged home with potassium supplementation for the next 5 days and he will followup with his cardiologist.        Shelda Jakes, MD 11/25/12 [phone]

## 2012-11-25 NOTE — ED Notes (Signed)
Ambulatory To bathroom

## 2012-11-25 NOTE — Progress Notes (Signed)
Quick Note:  Called and informed pt of results. Routing to Richard [name] to schedule HIDA. ______

## 2012-11-25 NOTE — Progress Notes (Signed)
Patient is scheduled for Wednesday May 14th at 8:00 am

## 2012-11-25 NOTE — ED Notes (Signed)
C.o chest tightness since Friday. Intermittent dizziness as well. Denies sob or nausea. Pt has nitro sl at home but has not taken any because " its gives me a headache"

## 2012-11-25 NOTE — Progress Notes (Signed)
Quick Note:  Fatty liver with normal LFTs. No gallstones. Recommend HIDA with CCK to complete gallbladder w/u. ______

## 2012-11-25 NOTE — ED Notes (Signed)
Tick removed by wife  this am from right inner thigh (attached)

## 2012-11-25 NOTE — Telephone Encounter (Signed)
Patient has been approved and will start pain management with Dr. Gerilyn Pilgrim on June 11th per [facility] @ Dr. Harriett Sine office

## 2012-11-25 NOTE — Progress Notes (Signed)
Quick Note:  Called and informed pt. He is aware that Soledad Gerlach will be scheduling the HIDA. ______

## 2012-11-27 ENCOUNTER — Encounter (HOSPITAL_COMMUNITY): Payer: Self-pay

## 2012-11-27 ENCOUNTER — Telehealth: Payer: Self-pay | Admitting: *Deleted

## 2012-11-27 ENCOUNTER — Encounter (HOSPITAL_COMMUNITY)
Admission: RE | Admit: 2012-11-27 | Discharge: 2012-11-27 | Disposition: A | Discharge status: Home / Self Care | Payer: Self-pay | Source: Ambulatory Visit | Attending: Gastroenterology | Admitting: Gastroenterology

## 2012-11-27 DIAGNOSIS — R109 Unspecified abdominal pain: Secondary | ICD-10-CM | POA: Insufficient documentation

## 2012-11-27 DIAGNOSIS — R1011 Right upper quadrant pain: Secondary | ICD-10-CM

## 2012-11-27 HISTORY — DX: Unspecified asthma, uncomplicated: J45.909

## 2012-11-27 MED ORDER — TECHNETIUM TC 99M MEBROFENIN IV KIT
5.0000 | PACK | Freq: Once | INTRAVENOUS | Status: AC | PRN
  Administered 2012-11-27: 5 via INTRAVENOUS

## 2012-11-27 MED ORDER — SINCALIDE 5 MCG IJ SOLR
INTRAMUSCULAR | Status: AC
  Administered 2012-11-27: 1.83 ug via INTRAVENOUS
  Filled 2012-11-27: qty 5

## 2012-11-27 NOTE — Telephone Encounter (Signed)
[facility] states that they have not received rx refill for toprol pt takes 1 1/2 tabs, need someone to call [facility] for refill.    I do see refill in system already for 10/31/12 but 30 tabs a month would not be enough if he is taking 75mg  a day.

## 2012-11-28 ENCOUNTER — Other Ambulatory Visit: Payer: Self-pay | Admitting: *Deleted

## 2012-11-28 MED ORDER — METOPROLOL SUCCINATE ER 50 MG PO TB24: ORAL_TABLET | ORAL | Status: DC

## 2012-11-28 NOTE — Telephone Encounter (Signed)
Noted cma TS sent pt refill for 45 tablets and 6 refills via escribe

## 2012-12-02 ENCOUNTER — Ambulatory Visit (HOSPITAL_COMMUNITY): Payer: Self-pay | Admitting: Psychiatry

## 2012-12-02 ENCOUNTER — Telehealth: Payer: Self-pay | Admitting: Gastroenterology

## 2012-12-02 MED ORDER — SUCRALFATE 1 GM/10ML PO SUSP: 1.0000 g | Freq: Three times a day (TID) | ORAL | Status: DC

## 2012-12-02 NOTE — Telephone Encounter (Signed)
Called pt . He said he is having some pain in his left lower back since he had the HIDA. Not sharp pains, just feels like he was hit and bruised. The pain is relieved when he stands, when he sits it gets worse and when he lies down it gets even worse. No nausea or vomiting. Last BM was 2 days ago but he normally just goes every 2 days. He rates the pain at a 4-5 at this time, although it was worse last night and he has not had much sleep. Please advise!

## 2012-12-02 NOTE — Telephone Encounter (Signed)
Pt's wife called this morning asking about his results and said the patient was in pain and was told it could be a kidney stone but wasn't sure. Please advise and call [phone]

## 2012-12-02 NOTE — Telephone Encounter (Signed)
Called and informed pt. He will schedule appt with Dr. Darrick Penna when he comes by to pick up the samples  ( 3 boxes of 6 cups/ one gm each to take before meals ).

## 2012-12-02 NOTE — Telephone Encounter (Signed)
HIDA scan showed normal gallbladder ejection fraction. Reportedly had some abdominal pain after the CCK but not enough information to suggest having his gallbladder out.  Left lower back pain likely non-GI related and may be exacerbation of his chronic back pain with laying on flat/hard scanning table for HIDA. Recommend he followup with his PCP.  Regarding postprandial substernal chest pain/abdominal pain, would offer the following.  Carafate 1 g Q. a.c. for 10 days. He is uninsured therefore you could offer him samples if available.  Office visit with Dr. Darrick Penna within the next couple of weeks.

## 2012-12-02 NOTE — Progress Notes (Signed)
Quick Note:  HIDA normal with regards to ejection fraction of gallbladder. Reportedly patient had some abdominal pain during the infusion of CCK. See telephone encounter for today. ______

## 2012-12-03 ENCOUNTER — Telehealth: Payer: Self-pay

## 2012-12-03 NOTE — Telephone Encounter (Signed)
Pt is aware of OV with SF and appt card was given to the wife

## 2012-12-03 NOTE — Telephone Encounter (Signed)
PLEASE CALL PT. WE DO NOT HAVE ANY APPT WITH DR. Shreshta Medley AVAILABLE. WE CAN PUT HIM ON A CANCELLATION LIST. IF HE IS HAVING BACK PAIN HE SHOULD SEE HIS PCP.   FOR CHEST PAIN WHICH IS DUE TO REFKUX AND NON-ULCER DYSPEPSIA HE SHOULD: 1. FOLLOW A LOW FAT DIET 2. CONTINUE NEXIUM 30 MINS PRIOR TO MEALS BID, NOT ONCE A DYA OR AS NEEDED 3. HE SHOULD AVOID THINGS THAT TRIGGERS REFLUX 4. WE MAY NEED TO CONSIDER INCREASING HIS DOSE OF PROZAC TO TREAT HIS NON-ULCER DYSPEPSIA.Richard [name]   HE WILL BE GIVEN FIRST AVAILABLE E30 OPV WITH DR. Tarig Zimmers.

## 2012-12-03 NOTE — Telephone Encounter (Signed)
I called and informed pt. Per Darl Pikes, Dr. Darrick Penna has had some cancellations for tomorrow and he can come at 10:00 AM and Darl Pikes is putting on the schedule.

## 2012-12-03 NOTE — Telephone Encounter (Signed)
See if [facility] can see him this week.

## 2012-12-03 NOTE — Telephone Encounter (Signed)
Routing to [facility].

## 2012-12-03 NOTE — Telephone Encounter (Signed)
Pt's wife, Neysa Bonito, called and said the pt was still having pain on the left side, radiating to his back and abdomen. The pain is intermittent and is worse when he lies or sits. Standing makes it better. Said he also has had a lot of gas in the last few days. He picked up the Carafate samples yesterday and will begin this AM. He also states that he feels swollen in his left side a little. Please advise!

## 2012-12-04 ENCOUNTER — Encounter: Payer: Self-pay | Admitting: Gastroenterology

## 2012-12-04 ENCOUNTER — Ambulatory Visit (INDEPENDENT_AMBULATORY_CARE_PROVIDER_SITE_OTHER): Payer: Self-pay | Admitting: Gastroenterology

## 2012-12-04 VITALS — BP 102/64 | HR 56 | Temp 98.4°F | Ht 68.0 in | Wt 204.8 lb

## 2012-12-04 DIAGNOSIS — G8929 Other chronic pain: Secondary | ICD-10-CM

## 2012-12-04 DIAGNOSIS — R1013 Epigastric pain: Secondary | ICD-10-CM

## 2012-12-04 DIAGNOSIS — M545 Low back pain: Secondary | ICD-10-CM

## 2012-12-04 MED ORDER — IBUPROFEN 600 MG PO TABS: ORAL_TABLET | ORAL | Status: DC

## 2012-12-04 NOTE — Progress Notes (Signed)
Subjective:    Patient ID: Richard [name], male    DOB: 1965/07/24, 47 y.o.   MRN: [id number]  [facility]P: [facility]  HPI AFTER HAD HIDA SCAN HAD SORENESS IN L BACK RADIATING TO FRONT(SORE). WEEKEND AFTER THE SCAN REACHED FOR SOMETHING WITH L HAND AND IT FELT LIKE IT WWAS TEARING.   SX BEEN THERE FOR ONE WEEK. HAD DIARRHEA AND BURNING WITH URINATION. NOT HAVING DIARRHEA OR DYSURIA ANYMORE. NOW JUST HAS BACK PAIN. HAS BACK TROUBLE ALL THE TIME BUT IT IS DIFFERENT. HAS TOOTH ACHE. FEELS FLUTTERING IN HIS CHEST WHEN HE LAYS. TAKING NEXIUM BID. USING CARAFATE BUT NO DIFFERENCE. COMPLETING CLINDAMYCIN. HAD CHICKEN POX.  PT DENIES FEVER, CHILLS, BRBPR, nausea, vomiting, melena, diarrhea, constipation, problems swallowing,  heartburn or indigestion.  Past Medical History  Diagnosis Date  . Chest pain     + palpitations; cath 2005- 30-40% mid LAD, 20% D1, 20% cx, OM, 20-30% RCA, and EF-55%  . COPD (chronic obstructive pulmonary disease)   . GERD (gastroesophageal reflux disease)   . Hyperlipemia   . Hypertension   . Depression   . Colitis 1990  . Tobacco abuse     1/2 pack per day  . Diabetes mellitus   . Gastric ulcer 2003; 2012    2003: + esophagitis; negative H.pylori serology  2012: Dr. Darrick Penna, mild gastritis, Bravo PH probe placement, negative H.pylori  . Hepatic steatosis   . Chronic low back pain   . Panic attacks   . Asthma     Past Surgical History  Procedure Laterality Date  . Colonoscopy  1990  . Shoulder surgery      Right acromioclavicular joint arthrosis  . Neck mass excision    . Bravo ph study  05/03/2011    Mild gastritis/normal esophagus and duodenum  . Cardiac catheterization  2005   Allergies  Allergen Reactions  . Mushroom Ext Cmplx(Shiitake-Reishi-Mait) Anaphylaxis    Rapid heart rate.  . Penicillins     REACTION: Unknown reaction    Current Outpatient Prescriptions  Medication Sig Dispense Refill  . acetaminophen (TYLENOL) 325 MG tablet Take 650 mg by  mouth every 6 (six) hours as needed for pain or fever. For pain      . albuterol (VENTOLIN HFA) 108 (90 BASE) MCG/ACT inhaler Inhale 2 puffs into the lungs every 6 (six) hours as needed for wheezing or shortness of breath.       . ALPRAZolam (XANAX) 0.5 MG tablet Take 0.5 mg by mouth 2 (two) times daily as needed for sleep or anxiety.       [name] aspirin 81 MG tablet Take 81 mg by mouth at bedtime.       [name] esomeprazole (NEXIUM) 40 MG capsule Take 40 mg by mouth 2 (two) times daily.      [name] FLUoxetine (PROZAC) 10 MG tablet Take 0.5-1 tablets (5-10 mg total) by mouth daily.    . Fluticasone-Salmeterol (ADVAIR) 250-50 MCG/DOSE AEPB Inhale 1 puff into the lungs every 12 (twelve) hours.    [name] HYDROcodone-acetaminophen (NORCO/VICODIN) 5-325 MG per tablet Take 1 tablet by mouth every 6 (six) hours as needed for pain.    [name] lisinopril (PRINIVIL,ZESTRIL) 20 MG tablet Take 20 mg by mouth at bedtime.    . lovastatin (MEVACOR) 20 MG tablet Take 20 mg by mouth at bedtime.    . lovastatin (MEVACOR) 20 MG tablet TAKE ONE TABLET BY MOUTH EVERY DAY AT BEDTIME. **REPLACES LIPITOR**    . metFORMIN (GLUCOPHAGE) 500 MG tablet  Take 500 mg by mouth 2 (two) times daily with a meal.      . metoprolol succinate (TOPROL-XL) 50 MG 24 hr tablet Take 1.5 tablets = 75mg  daily    . nitroGLYCERIN (NITROSTAT) 0.4 MG SL tablet Place 0.4 mg under the tongue every 5 (five) minutes as needed.    . ondansetron (ZOFRAN) 8 MG tablet Take 1 tablet (8 mg total) by mouth every 8 (eight) hours as needed for nausea.    . potassium chloride SA (K-DUR,KLOR-CON) 20 MEQ tablet Take 1 tablet (20 mEq total) by mouth 2 (two) times daily.    . sucralfate (CARAFATE) 1 GM/10ML suspension Take 10 mLs (1 g total) by mouth 3 (three) times daily before meals.       Review of Systems     Objective:   Physical Exam  Vitals reviewed. Constitutional: He is oriented to person, place, and time. He appears well-nourished. No distress.  HENT:  Head:  Normocephalic and atraumatic.  Mouth/Throat: Oropharynx is clear and moist. No oropharyngeal exudate.  Eyes: Pupils are equal, round, and reactive to light. No scleral icterus.  Neck: Normal range of motion. Neck supple.  Cardiovascular: Normal rate, regular rhythm and normal heart sounds.   Pulmonary/Chest: Effort normal and breath sounds normal. No respiratory distress.  Abdominal: Soft. Bowel sounds are normal. He exhibits no distension. There is tenderness (MILD TTP IN EPIGASTRIUM AND RUQ). There is no rebound and no guarding.  Musculoskeletal: He exhibits no edema.       Arms: Lymphadenopathy:    He has no cervical adenopathy.  Neurological: He is alert and oriented to person, place, and time.  NO  NEW FOCAL DEFICITS   Skin:  HYPERESTHESIA IN L PARASPINOUS AREA W/O VESICLES/ERYTHEMA  Psychiatric: He has a normal mood and affect.          Assessment & Plan:

## 2012-12-04 NOTE — Assessment & Plan Note (Signed)
PT HAS ACUTE FLARE IN L LOWER LUMBAR REGION(PARA-SPINOUS). DIFFERENTIAL DIAGNOSIS INCLUDES EARLY ONSET SHINGLES OR MS STRAIN.  ICE TO PAINFUL AREA TID FOR 10 MINS FOR 7 DAYS IBUPROFEN TID FOR 7 DAYS OPV W/ [facility] IF SX NOT IMPROVED.

## 2012-12-04 NOTE — Patient Instructions (Signed)
FOR YOUR BACK PAIN, TAKE IBUPROFEN 600 MG TID FOR 7 DAYS WITH FOOD OR MILK.  APPLY ICE PACK FOR 10 MINS THREE TIMES A DAY TO YOU LEFT BACK. DO NOT PLACE DIRECTLY ON YOUR SKIN.  SEE THE [facility] IF YOUR BACK PAIN IS NOT BETTER IN 7 DAYS.  FOLLOW LIFESTYLE RECOMMENDATION TO HELP MANAGE YOUR REFLUX. SEE INFO BELOW.  CONTINUE NEXIUM. TAKE 30 MINUTES BEFORE MEALS TWICE DAILY.  FOLLOW A LOW FAT DIET. SEE INFO BELOW.  START AN EXERCISE PROGRAM. YOU SHOULD WALK FOR 30 MINS STRAIGHT 3 TIMES A WEEK.  FOLLOW UP IN 4 MOS.       Lifestyle and home remedies TO MANAGE REFLUX/CHEST PAIN  You may eliminate or reduce the frequency of heartburn by making the following lifestyle changes:    Control your weight. Being overweight is a major risk factor for heartburn and GERD. Excess pounds put pressure on your abdomen, pushing up your stomach and causing acid to back up into your esophagus.     Eat smaller meals. 4 TO 6 MEALS A DAY. This reduces pressure on the lower esophageal sphincter, helping to prevent the valve from opening and acid from washing back into your esophagus.     Loosen your belt. Clothes that fit tightly around your waist put pressure on your abdomen and the lower esophageal sphincter.     Eliminate heartburn triggers. Everyone has specific triggers. Common triggers such as fatty or fried foods, spicy food, tomato sauce, carbonated beverages, alcohol, chocolate, mint, garlic, onion, caffeine and nicotine may make heartburn worse.     Avoid stooping or bending. Tying your shoes is OK. Bending over for longer periods to weed your garden isn't, especially soon after eating.     Don't lie down after a meal. Wait at least three to four hours after eating before going to bed, and don't lie down right after eating.     PUT THE HEAD OF YOUR BED ON 6 INCH BLOCKS.   Alternative medicine   Several home remedies exist for treating GERD, but they provide only temporary relief. They  include drinking baking soda (sodium bicarbonate) added to water or drinking other fluids such as baking soda mixed with cream of tartar and water.    Although these liquids create temporary relief by neutralizing, washing away or buffering acids, eventually they aggravate the situation by adding gas and fluid to your stomach, increasing pressure and causing more acid reflux. Further, adding more sodium to your diet may increase your blood pressure and add stress to your heart, and excessive bicarbonate ingestion can alter the acid-base balance in your body.      Low-Fat Diet BREADS, CEREALS, PASTA, RICE, DRIED PEAS, AND BEANS These products are high in carbohydrates and most are low in fat. Therefore, they can be increased in the diet as substitutes for fatty foods. They too, however, contain calories and should not be eaten in excess. Cereals can be eaten for snacks as well as for breakfast.   FRUITS AND VEGETABLES It is good to eat fruits and vegetables. Besides being sources of fiber, both are rich in vitamins and some minerals. They help you get the daily allowances of these nutrients. Fruits and vegetables can be used for snacks and desserts.  MEATS Limit lean meat, chicken, [place], and fish to no more than 6 ounces per day. Beef, Pork, and Lamb Use lean cuts of beef, pork, and lamb. Lean cuts include:  Extra-lean ground beef.  Arm roast.  Sirloin tip.  Center-cut ham.  Round steak.  Loin chops.  Rump roast.  Tenderloin.  Trim all fat off the outside of meats before cooking. It is not necessary to severely decrease the intake of red meat, but lean choices should be made. Lean meat is rich in protein and contains a highly absorbable form of iron. Premenopausal women, in particular, should avoid reducing lean red meat because this could increase the risk for low red blood cells (iron-deficiency anemia).  Chicken and [place] These are good sources of protein. The fat of poultry can be  reduced by removing the skin and underlying fat layers before cooking. Chicken and [place] can be substituted for lean red meat in the diet. Poultry should not be fried or covered with high-fat sauces. Fish and Shellfish Fish is a good source of protein. Shellfish contain cholesterol, but they usually are low in saturated fatty acids. The preparation of fish is important. Like chicken and [place], they should not be fried or covered with high-fat sauces. EGGS Egg whites contain no fat or cholesterol. They can be eaten often. Try 1 to 2 egg whites instead of whole eggs in recipes or use egg substitutes that do not contain yolk. MILK AND DAIRY PRODUCTS Use skim or 1% milk instead of 2% or whole milk. Decrease whole milk, natural, and processed cheeses. Use nonfat or low-fat (2%) cottage cheese or low-fat cheeses made from vegetable oils. Choose nonfat or low-fat (1 to 2%) yogurt. Experiment with evaporated skim milk in recipes that call for heavy cream. Substitute low-fat yogurt or low-fat cottage cheese for sour cream in dips and salad dressings. Have at least 2 servings of low-fat dairy products, such as 2 glasses of skim (or 1%) milk each day to help get your daily calcium intake. FATS AND OILS Reduce the total intake of fats, especially saturated fat. Butterfat, lard, and beef fats are high in saturated fat and cholesterol. These should be avoided as much as possible. Vegetable fats do not contain cholesterol, but certain vegetable fats, such as coconut oil, palm oil, and palm kernel oil are very high in saturated fats. These should be limited. These fats are often used in bakery goods, processed foods, popcorn, oils, and nondairy creamers. Vegetable shortenings and some peanut butters contain hydrogenated oils, which are also saturated fats. Read the labels on these foods and check for saturated vegetable oils. Unsaturated vegetable oils and fats do not raise blood cholesterol. However, they should be  limited because they are fats and are high in calories. Total fat should still be limited to 30% of your daily caloric intake. Desirable liquid vegetable oils are corn oil, cottonseed oil, olive oil, canola oil, safflower oil, soybean oil, and sunflower oil. Peanut oil is not as good, but small amounts are acceptable. Buy a heart-healthy tub margarine that has no partially hydrogenated oils in the ingredients. Mayonnaise and salad dressings often are made from unsaturated fats, but they should also be limited because of their high calorie and fat content. Seeds, nuts, peanut butter, olives, and avocados are high in fat, but the fat is mainly the unsaturated type. These foods should be limited mainly to avoid excess calories and fat. OTHER EATING TIPS Snacks  Most sweets should be limited as snacks. They tend to be rich in calories and fats, and their caloric content outweighs their nutritional value. Some good choices in snacks are graham crackers, melba toast, soda crackers, bagels (no egg), English muffins, fruits, and vegetables. These snacks are  preferable to snack crackers, [place] fries, TORTILLA CHIPS, and POTATO chips. Popcorn should be air-popped or cooked in small amounts of liquid vegetable oil. Desserts Eat fruit, low-fat yogurt, and fruit ices instead of pastries, cake, and cookies. Sherbet, angel food cake, gelatin dessert, frozen low-fat yogurt, or other frozen products that do not contain saturated fat (pure fruit juice bars, frozen ice pops) are also acceptable.  COOKING METHODS Choose those methods that use little or no fat. They include: Poaching.  Braising.  Steaming.  Grilling.  Baking.  Stir-frying.  Broiling.  Microwaving.  Foods can be cooked in a nonstick pan without added fat, or use a nonfat cooking spray in regular cookware. Limit fried foods and avoid frying in saturated fat. Add moisture to lean meats by using water, broth, cooking wines, and other nonfat or low-fat  sauces along with the cooking methods mentioned above. Soups and stews should be chilled after cooking. The fat that forms on top after a few hours in the refrigerator should be skimmed off. When preparing meals, avoid using excess salt. Salt can contribute to raising blood pressure in some people.  EATING AWAY FROM HOME Order entres, potatoes, and vegetables without sauces or butter. When meat exceeds the size of a deck of cards (3 to 4 ounces), the rest can be taken home for another meal. Choose vegetable or fruit salads and ask for low-calorie salad dressings to be served on the side. Use dressings sparingly. Limit high-fat toppings, such as bacon, crumbled eggs, cheese, sunflower seeds, and olives. Ask for heart-healthy tub margarine instead of butter.

## 2012-12-04 NOTE — Assessment & Plan Note (Signed)
PT HAS KNOWN HX OF NON-ULCER DYSPEPSIA.  ENCOURAGED HIM TO START PROZAC AT 10 MG DAILY. CONTINUE BID NEXIUM OPV IN 4 MOS

## 2012-12-04 NOTE — Progress Notes (Signed)
CC TO [facility]

## 2012-12-05 ENCOUNTER — Ambulatory Visit (HOSPITAL_COMMUNITY): Payer: Self-pay | Admitting: Psychology

## 2012-12-05 NOTE — Progress Notes (Signed)
Reminder in epic °

## 2012-12-06 ENCOUNTER — Telehealth: Payer: Self-pay | Admitting: Orthopedic Surgery

## 2012-12-06 NOTE — Telephone Encounter (Signed)
Patient called, asking about an appointment.  Previous telephone note per Dr. Romeo Apple indicates that patient may go to neurosurgeon of choice, as our clinic does not treat chronic back pain.  I reviewed this note with patient. Patient relays that this time it is "burning area" on 1 side of lower back.  Chart notes indicate patient is also treating at [facility] at this time.  Please advise if any new recommendations or appointment.  Ph # [phone].

## 2012-12-12 ENCOUNTER — Ambulatory Visit (HOSPITAL_COMMUNITY): Payer: Self-pay | Admitting: Psychiatry

## 2012-12-13 ENCOUNTER — Encounter (HOSPITAL_COMMUNITY): Payer: Self-pay | Admitting: Psychology

## 2012-12-13 NOTE — Progress Notes (Signed)
Patient:  Richard [name]   DOB: 08-01-1965  MR Number: [id number]  Location: [facility] [address] [place] [address] Dept: [phone]  Start: 4 PM End: 5 PM  Provider/Observer:     Hershal Coria PSYD  Chief Complaint:      Chief Complaint  Patient presents with  . Anxiety  . Depression  . Panic Attack    Reason For Service:    The patient was referred by Dr. Dietrich Pates because of severe anxiety and foot appear to be panic attacks. The patient reports that he has been having a lot of anxiety and panic attacks. He has numerous medical issues including severe issues with diabetes, COPD, fatty liver disease and orthopedic issues. He also has had a long history of depression anxiety as well. Anxiety and depression have been going on for least 5 years and correlate with his health issues and financial difficulties. The patient relates this deterioration to an injury at work where he injured both of his shoulders while pulling a pallet. He had surgery on one of his shoulders initially and he experiences around his injury and surgery began to develop panic attacks. The patient is also been diagnosed with COPD and diabetes but continues to smoke and fears that if he completely quit smoking that he will gain a lot of weight again.   Interventions Strategy:  Cognitive/behavioral psychotherapeutic interventions  Participation Level:   Active  Participation Quality:  Appropriate      Behavioral Observation:  Fairly Groomed, Alert, and Appropriate.   Current Psychosocial Factors: The patient reports that his panic attacks and is anxiety continue to be significantly limiting to him. He reports that his anticipation of warmer weather is caused him increased anxiety and worry as he knows that when it is hot outside that he tends to have increasing difficulties breathing.  Content of Session:   Review  current symptoms and continued work on therapeutic interventions around numerous medical issues and panic attacks.  Current Status:   The patient reports that as the weather is warm she has had more problems with his breathing which didn't trigger panic attacks. He reports that he is essentially limited to staying at home almost all the time unless he has to leave. The patient reports this produces more depression and feelings of helplessness and worthlessness..  Patient Progress:   Stable  Target Goals:   Target goals include reduce the intensity, duration, and frequency of panic attacks, reduce the amount of benzodiazepine or other potentially dependence forming medications.  Last Reviewed:   11/21/2012  Goals Addressed Today:    Today we worked on issues of reducing the intensity and frequency of panic attacks  Impression/Diagnosis:   the patient appears to be doing with panic attacks along with anxiety and depression. He has numerous medical issues including COPD, diabetes, and other issues as well as liver disease. The patient is not taking very good care of himself and it was stressed specifically that he is going to need to quit smoking cigarettes and completely stopped caffeinated and sugar drinks if he is going to have any hope of improving his quality of life. The patient was amenable to initially try to stop the caffeinated drinks and we developed a plan for this. After that we will begin to address the smoking.    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

## 2012-12-16 ENCOUNTER — Other Ambulatory Visit: Payer: Self-pay | Admitting: Cardiology

## 2012-12-19 ENCOUNTER — Encounter: Payer: Self-pay | Admitting: Cardiology

## 2012-12-19 ENCOUNTER — Ambulatory Visit (INDEPENDENT_AMBULATORY_CARE_PROVIDER_SITE_OTHER): Payer: Self-pay | Admitting: Cardiology

## 2012-12-19 VITALS — BP 119/73 | HR 75 | Ht 71.0 in | Wt 200.8 lb

## 2012-12-19 DIAGNOSIS — F341 Dysthymic disorder: Secondary | ICD-10-CM

## 2012-12-19 DIAGNOSIS — F329 Major depressive disorder, single episode, unspecified: Secondary | ICD-10-CM

## 2012-12-19 DIAGNOSIS — I7090 Unspecified atherosclerosis: Secondary | ICD-10-CM

## 2012-12-19 DIAGNOSIS — I10 Essential (primary) hypertension: Secondary | ICD-10-CM

## 2012-12-19 DIAGNOSIS — F17200 Nicotine dependence, unspecified, uncomplicated: Secondary | ICD-10-CM

## 2012-12-19 DIAGNOSIS — E785 Hyperlipidemia, unspecified: Secondary | ICD-10-CM

## 2012-12-19 DIAGNOSIS — I2510 Atherosclerotic heart disease of native coronary artery without angina pectoris: Secondary | ICD-10-CM

## 2012-12-19 DIAGNOSIS — R002 Palpitations: Secondary | ICD-10-CM

## 2012-12-19 NOTE — Patient Instructions (Addendum)
Your physician recommends that you schedule a follow-up appointment in: 6 MONTHS  Your physician recommends that you return for lab work in: MONTH (FLP,BMET) Your physician recommends that you have follow up lab work, we will mail you a reminder letter to alert you when to go [employer], located across the street from our office.   BRING IN A FORM FROM THE [employer] FOR DISABLED PARKING, DR RR WILL FILL OUT AND CALL YOU ONCE COMPLETED  Your physician recommends THAT YOU MAY USE ALEVE 1-2 TABLETS TWICE DAILY IN ADDITION TO THE CURRENT PAIN MEDICATION DO NOT USE EXTRA TYLENOL   Your physician recommendS THAT YOU INCREASE YOUR INTAKE OF POTASSIUM IN YOUR DIET;  Foods Rich in Potassium The body needs potassium to:  Control blood pressure.   Keep the muscles healthy.   Keep the nervous system healthy.  Most foods contain potassium. Eating a variety of foods in the right amounts will help control the level of potassium in your body.  Food / Potassium (mg)  Apricots, dried,  cup / 378 mg   Apricots, raw, 1 cup halves / 401 mg   Avocado,  / 487 mg   Banana, 1 large / 487 mg   Beef, lean, round, 3 oz / 202 mg   Cantaloupe, 1 cup cubes / 427 mg   Dates, medjool, 5 whole / 835 mg   Ham, cured, 3 oz / 212 mg   Lentils, dried,  cup / 458 mg   Lima beans, frozen,  cup / 258 mg   Orange, 1 large / 333 mg   Orange juice, 1 cup / 443 mg   Peaches, dried,  cup / 398 mg   Peas, split, cooked,  cup / 355 mg   Potato, boiled, 1 medium / 515 mg   Prunes, dried, uncooked,  cup / 318 mg   Raisins,  cup / 309 mg   Salmon, pink, raw, 3 oz / 275 mg   Sardines, canned , 3 oz / 338 mg   Tomato, raw, 1 medium / 292 mg   Tomato juice, 6 oz / 417 mg   [place], 3 oz / 349 mg  Other Foods High in Potassium (greater than 250 mg):  Bran cereals and other bran products.   Milk (skim, 1%, 2%, whole).   Buttermilk.   Yogurt.   Nuts.   Dried fruits.   Cherries.   Sweet  potatoes.   Oranges.   Baked Beans.   Broccoli.   Spinach.   Peanut butter.   Tofu.  Foods Lower in Potassium (less than 250 mg):  Pasta.   Rice.   Cottage cheese.   Cheddar cheese.   Apples.   Mango.   Grapes.   Grapefruit.   Pineapple.   Raspberries.   Strawberries.   Watermelon.   Green Beans.   Cabbage.   Carrots.   Cauliflower.   Celery.   Corn.    Mushrooms.   Onions.   Squash.   Eggs.  The list below tells you how big or small some common portion sizes are:  1 oz.........4 stacked dice.   3 oz........Marland KitchenDeck of cards.   1 tsp.......Marland KitchenTip of little finger.   1 tbs......Marland KitchenMarland KitchenThumb.   2 tbs.......Marland KitchenGolf ball.    cup......Marland KitchenHalf of a fist.   1 cup.......Marland KitchenA fist.  Document Released: 12/20/2007 Document Revised: 03/15/2011 Document Reviewed: 11/16/2008 [facility] Patient Information 2012 [place], [place].

## 2012-12-19 NOTE — Assessment & Plan Note (Signed)
Recurrent palpitations without other worrisome symptoms. No further testing or treatment required at present.

## 2012-12-19 NOTE — Assessment & Plan Note (Signed)
No symptoms at present to suggest progression of coronary disease. We will continue to control risk factors to the extent possible.

## 2012-12-19 NOTE — Progress Notes (Signed)
Patient ID: Richard [name], male   DOB: [birth year], 47 y.o.   MRN: [id number]  HPI: Schedule return visit for this unfortunate gentleman with multiple medical problems. Chronic back pain continues to markedly limit his exercise, and he is requesting a disabled parking placard. He has developed severe tooth pain, has been evaluated by a dentist and will require extraction once he is able to pay for that procedure. He is currently being treated with antibiotics and analgesics with inadequate control of symptoms.  Current Outpatient Prescriptions  Medication Sig Dispense Refill  . acetaminophen (TYLENOL) 325 MG tablet Take 650 mg by mouth every 6 (six) hours as needed for pain or fever. For pain      . albuterol (VENTOLIN HFA) 108 (90 BASE) MCG/ACT inhaler Inhale 2 puffs into the lungs every 6 (six) hours as needed for wheezing or shortness of breath.       . ALPRAZolam (XANAX) 0.5 MG tablet Take 0.5 mg by mouth 2 (two) times daily as needed for sleep or anxiety.       [name] aspirin 81 MG tablet Take 81 mg by mouth at bedtime.       . clindamycin (CLEOCIN) 150 MG capsule Take 150 mg by mouth every 8 (eight) hours.      [name] esomeprazole (NEXIUM) 40 MG capsule Take 40 mg by mouth 2 (two) times daily.      . Fluticasone-Salmeterol (ADVAIR) 250-50 MCG/DOSE AEPB Inhale 1 puff into the lungs every 12 (twelve) hours.      [name] HYDROcodone-acetaminophen (NORCO/VICODIN) 5-325 MG per tablet Take 1 tablet by mouth every 6 (six) hours as needed for pain.      [name] ibuprofen (ADVIL,MOTRIN) 600 MG tablet 1 PO TID FOR 7 DAYS  21 tablet  0  . lisinopril (PRINIVIL,ZESTRIL) 20 MG tablet Take 20 mg by mouth at bedtime.      [name] lisinopril (PRINIVIL,ZESTRIL) 20 MG tablet TAKE ONE TABLET BY MOUTH EVERY DAY  30 tablet  0  . lovastatin (MEVACOR) 20 MG tablet Take 20 mg by mouth at bedtime.      . lovastatin (MEVACOR) 20 MG tablet TAKE ONE TABLET BY MOUTH EVERY DAY AT BEDTIME. **REPLACES LIPITOR**  30 tablet  0  . metFORMIN (GLUCOPHAGE)  500 MG tablet Take 500 mg by mouth 2 (two) times daily with a meal.        . metoprolol succinate (TOPROL-XL) 50 MG 24 hr tablet Take 1.5 tablets = 75mg  daily  45 tablet  6  . nitroGLYCERIN (NITROSTAT) 0.4 MG SL tablet Place 0.4 mg under the tongue every 5 (five) minutes as needed.      . ondansetron (ZOFRAN) 8 MG tablet Take 1 tablet (8 mg total) by mouth every 8 (eight) hours as needed for nausea.  5 tablet  0  . FLUoxetine (PROZAC) 10 MG tablet Take 0.5-1 tablets (5-10 mg total) by mouth daily.  30 tablet  0   No current facility-administered medications for this visit.   Allergies  Allergen Reactions  . Mushroom Ext Cmplx(Shiitake-Reishi-Mait) Anaphylaxis    Rapid heart rate.  . Penicillins     REACTION: Unknown reaction     Past medical history, social history, and family history reviewed and updated.  ROS: Denies dyspnea, chest discomfort, pedal edema, lightheadedness or syncope. He has experienced intermittent palpitations for which she was evaluated in the emergency department without a specific diagnosis. All other systems reviewed and are negative.  PHYSICAL EXAM: BP 119/73  Pulse 75  Ht  5\' 11"  (1.803 m)  Wt 91.06 kg (200 lb 12 oz)  BMI 28.01 kg/m2;  Body mass index is 28.01 kg/(m^2). General-Well developed; holding left jaw and intermittently not responding to questions as the result of pain Body habitus-proportionate weight and height Neck-No JVD; no carotid bruits Lungs-clear lung fields; resonant to percussion Cardiovascular-normal PMI; normal S1 and S2; minimal systolic ejection murmur Abdomen-normal bowel sounds; soft and non-tender without masses or organomegaly Musculoskeletal-No deformities, no cyanosis or clubbing Neurologic-Normal cranial nerves; symmetric strength and tone Skin-Warm, no significant lesions Extremities-distal pulses intact; no edema  [place] Bing, MD 12/19/2012  2:37 PM  ASSESSMENT AND PLAN

## 2012-12-19 NOTE — Progress Notes (Deleted)
Name: Richard [name]    DOB: 09-27-1965  Age: 47 y.o.  MR#: [id number]       PCP:  Provider Not In System      Insurance: Payor: / No coverage found.  CC:   No chief complaint on file.  NO LIST, ADVISED SCOTT [facility]INES GAVE PT ABT, WILL CALL PT PHARMACY TO CONFIRM WHICH ONE, CURRENTLY TAKING PER TOOTHACHE VS Filed Vitals:   12/19/12 1406  BP: 119/73  Pulse: 75  Height: 5\' 11"  (1.803 m)  Weight: 200 lb 12 oz (91.06 kg)    Weights Current Weight  12/19/12 200 lb 12 oz (91.06 kg)  12/04/12 204 lb 12.8 oz (92.897 kg)  11/18/12 206 lb 6.4 oz (93.622 kg)    Blood Pressure  BP Readings from Last 3 Encounters:  12/19/12 119/73  12/04/12 102/64  11/25/12 121/70     Admit date:  (Not on file) Last encounter with [facility]:  12/16/2012   Allergy Mushroom ext cmplx(shiitake-reishi-mait) and Penicillins  Current Outpatient Prescriptions  Medication Sig Dispense Refill  . acetaminophen (TYLENOL) 325 MG tablet Take 650 mg by mouth every 6 (six) hours as needed for pain or fever. For pain      . albuterol (VENTOLIN HFA) 108 (90 BASE) MCG/ACT inhaler Inhale 2 puffs into the lungs every 6 (six) hours as needed for wheezing or shortness of breath.       . ALPRAZolam (XANAX) 0.5 MG tablet Take 0.5 mg by mouth 2 (two) times daily as needed for sleep or anxiety.       [name] aspirin 81 MG tablet Take 81 mg by mouth at bedtime.       [name] esomeprazole (NEXIUM) 40 MG capsule Take 40 mg by mouth 2 (two) times daily.      . Fluticasone-Salmeterol (ADVAIR) 250-50 MCG/DOSE AEPB Inhale 1 puff into the lungs every 12 (twelve) hours.      [name] HYDROcodone-acetaminophen (NORCO/VICODIN) 5-325 MG per tablet Take 1 tablet by mouth every 6 (six) hours as needed for pain.      [name] ibuprofen (ADVIL,MOTRIN) 600 MG tablet 1 PO TID FOR 7 DAYS  21 tablet  0  . lisinopril (PRINIVIL,ZESTRIL) 20 MG tablet Take 20 mg by mouth at bedtime.      [name] lisinopril (PRINIVIL,ZESTRIL) 20 MG tablet TAKE ONE TABLET BY MOUTH EVERY DAY  30 tablet  0  .  lovastatin (MEVACOR) 20 MG tablet Take 20 mg by mouth at bedtime.      . lovastatin (MEVACOR) 20 MG tablet TAKE ONE TABLET BY MOUTH EVERY DAY AT BEDTIME. **REPLACES LIPITOR**  30 tablet  0  . metFORMIN (GLUCOPHAGE) 500 MG tablet Take 500 mg by mouth 2 (two) times daily with a meal.        . metoprolol succinate (TOPROL-XL) 50 MG 24 hr tablet Take 1.5 tablets = 75mg  daily  45 tablet  6  . nitroGLYCERIN (NITROSTAT) 0.4 MG SL tablet Place 0.4 mg under the tongue every 5 (five) minutes as needed.      . ondansetron (ZOFRAN) 8 MG tablet Take 1 tablet (8 mg total) by mouth every 8 (eight) hours as needed for nausea.  5 tablet  0  . FLUoxetine (PROZAC) 10 MG tablet Take 0.5-1 tablets (5-10 mg total) by mouth daily.  30 tablet  0   No current facility-administered medications for this visit.    Discontinued Meds:    Medications Discontinued During This Encounter  Medication Reason  . potassium chloride SA (K-DUR,KLOR-CON) 20  MEQ tablet Error  . sucralfate (CARAFATE) 1 GM/10ML suspension Error    Patient Active Problem List   Diagnosis Date Noted  . Abdominal pain, chronic, epigastric 11/18/2012  . Atypical chest pain 11/18/2012  . 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 Unspecified vitamin D deficiency 08/20/2012  . Spinal stenosis of lumbar region with neurogenic claudication 06/06/2012  . Arteriosclerotic cardiovascular disease (ASCVD) 04/11/2012  . Chronic low back pain   . Palpitations 04/21/2011  . Hypertension   . Anxiety and depression   . DIABETES MELLITUS-TYPE II 10/03/2010  . TOBACCO ABUSE 09/14/2010  . CHRONIC OBSTRUCTIVE PULMONARY DISEASE 09/14/2010  . HYPERLIPIDEMIA 11/25/2009  . GASTROESOPHAGEAL REFLUX DISEASE 04/05/2009  . Hepatic steatosis 04/05/2009    LABS    Component Value Date/Time   NA 135 11/25/2012 0130   NA 137 10/23/2012   NA 140 10/14/2012 1550   NA 137 01/08/2012 1840   K 3.0* 11/25/2012 0130   K 4.3 10/23/2012   K 3.9 10/14/2012 1550   K 3.7 01/08/2012 1840   CL 97  11/25/2012 0130   CL 97 10/14/2012 1550   CL 100 01/08/2012 1840   CO2 25 11/25/2012 0130   CO2 25 10/23/2012   CO2 27 10/14/2012 1550   CO2 24 01/08/2012 1840   GLUCOSE 114* 11/25/2012 0130   GLUCOSE 132* 10/14/2012 1550   GLUCOSE 153* 01/08/2012 1840   BUN 17 11/25/2012 0130   BUN 15 10/23/2012   BUN 29* 10/14/2012 1550   BUN 15 01/08/2012 1840   CREATININE 1.01 11/25/2012 0130   CREATININE 0.91 10/23/2012   CREATININE 1.29 10/14/2012 1550   CREATININE 1.08 01/08/2012 1840   CREATININE 0.92 03/22/2011 1549   CALCIUM 9.4 11/25/2012 0130   CALCIUM 9.0 10/23/2012   CALCIUM 9.9 10/14/2012 1550   CALCIUM 10.0 01/08/2012 1840   GFRNONAA 87* 11/25/2012 0130   GFRNONAA 65* 10/14/2012 1550   GFRNONAA 80* 01/08/2012 1840   GFRAA >90 11/25/2012 0130   GFRAA 75* 10/14/2012 1550   GFRAA >90 01/08/2012 1840   CMP     Component Value Date/Time   NA 135 11/25/2012 0130   NA 137 10/23/2012   K 3.0* 11/25/2012 0130   K 4.3 10/23/2012   CL 97 11/25/2012 0130   CO2 25 11/25/2012 0130   CO2 25 10/23/2012   GLUCOSE 114* 11/25/2012 0130   BUN 17 11/25/2012 0130   BUN 15 10/23/2012   CREATININE 1.01 11/25/2012 0130   CREATININE 0.91 10/23/2012   CALCIUM 9.4 11/25/2012 0130   CALCIUM 9.0 10/23/2012   PROT 8.3 10/14/2012 1550   ALBUMIN 4.4 10/14/2012 1550   AST 11 10/23/2012   AST 11 10/14/2012 1550   ALT 29 10/23/2012   ALKPHOS 45 10/23/2012   ALKPHOS 70 10/14/2012 1550   BILITOT 0.4 10/23/2012   BILITOT 0.4 10/14/2012 1550   GFRNONAA 87* 11/25/2012 0130   GFRAA >90 11/25/2012 0130       Component Value Date/Time   WBC 9.3 11/25/2012 0130   WBC 8.2 10/23/2012   WBC 10.6* 10/14/2012 1550   HGB 15.5 11/25/2012 0130   HGB 15.4 10/23/2012   HGB 17.9* 10/14/2012 1550   HCT 43.0 11/25/2012 0130   HCT 44 10/23/2012   HCT 50.7 10/14/2012 1550   HCT 43.2 01/08/2012 1840   MCV 87.4 11/25/2012 0130   MCV 88.2 10/14/2012 1550   MCV 89.3 01/08/2012 1840    Lipid Panel     Component Value Date/Time   CHOL 129 04/28/2011 0000  TRIG 124 04/28/2011 0000    HDL 33* 04/28/2011 0000   CHOLHDL 3.9 04/28/2011 0000   VLDL 25 04/28/2011 0000   LDLCALC 71 04/28/2011 0000    ABG    Component Value Date/Time   PHART 7.386 04/25/2011 1500   PCO2ART 41.8 04/25/2011 1500   PO2ART 87.0 04/25/2011 1500   HCO3 24.6* 04/25/2011 1500   TCO2 21.1 04/25/2011 1500   O2SAT 97.2 04/25/2011 1500     Lab Results  Component Value Date   TSH 0.958 04/21/2011   BNP (last 3 results) No results found for this basename: PROBNP,  in the last 8760 hours Cardiac Panel (last 3 results) No results found for this basename: CKTOTAL, CKMB, TROPONINI, RELINDX,  in the last 72 hours  Iron/TIBC/Ferritin No results found for this basename: iron, tibc, ferritin     EKG Orders placed during the hospital encounter of 11/25/12  . ED EKG  . ED EKG  . EKG 12-LEAD  . EKG 12-LEAD  . EKG     Prior Assessment and Plan Problem List as of 12/19/2012   Palpitations   Last Assessment & Plan   04/11/2012 Office Visit Written 04/11/2012 12:46 PM by Kathlen Brunswick, MD     Palpitations persist, but by description did not appear to represent a significant arrhythmia.    CNS disorder   HYPERLIPIDEMIA   Last Assessment & Plan   04/11/2012 Office Visit Written 04/11/2012 12:45 PM by Kathlen Brunswick, MD     Hyperlipidemia adequately controlled when assessed a few months ago.  Current therapy will be maintained.    GASTROESOPHAGEAL REFLUX DISEASE   Last Assessment & Plan   11/18/2012 Office Visit Written 11/18/2012  2:17 PM by Tiffany Kocher, PA-C     Typical symptoms well-controlled on Nexium once to twice daily. Continue current regimen including antireflux measures as discussed.    Hepatic steatosis   Last Assessment & Plan   11/18/2012 Office Visit Edited 11/18/2012  2:17 PM by Tiffany Kocher, PA-C     LFTs are normal. Continue yearly LFTs.  Instructions for fatty liver: Recommend 1-2# weight loss per week until ideal body weight through exercise & diet. Low fat/cholesterol diet.    Avoid sweets, sodas, fruit juices, sweetened beverages like tea, etc. Gradually increase exercise from 15 min daily up to 1 hr per day 5 days/week. Limit alcohol use.      TOBACCO ABUSE   Last Assessment & Plan   09/30/2012 Office Visit Written 09/30/2012 12:36 PM by Kathlen Brunswick, MD     Tobacco abuse remains a problem. Patient does not appear prepared to address this until his psychologic status has improved substantially.    CHRONIC OBSTRUCTIVE PULMONARY DISEASE   Last Assessment & Plan   05/27/2012 Office Visit Written 05/27/2012  7:57 PM by Kathlen Brunswick, MD     Complete cessation of tobacco use is desirable, but patient does not feel that he can achieve this until anxiety is adequately treated.    DIABETES MELLITUS-TYPE II   Last Assessment & Plan   10/31/2011 Office Visit Written 10/31/2011 12:48 PM by Kathlen Brunswick, MD     A1c reportedly excellent a few months ago.  Patient advised to discontinue intake of sweet beverages, continue to lose weight and to monitor his CBGs occasionally    Hypertension   Last Assessment & Plan   09/30/2012 Office Visit Written 09/30/2012 12:35 PM by Kathlen Brunswick, MD     Blood  pressure control has been excellent.  Patient will report if this changes.    Anxiety and depression   Last Assessment & Plan   09/30/2012 Office Visit Written 09/30/2012 12:34 PM by Kathlen Brunswick, MD     Symptoms have improved with psychologic intervention. The suggestion was made to substitute propranolol for metoprolol. I explained to Mr. Caffie Damme that this is a minor change, but represents an effective decrease in dosage. We will continue to monitor blood pressure, and reported elevated values. I also advised that he take Cymbalta as prescribed.    Chronic low back pain   Last Assessment & Plan   12/04/2012 Office Visit Written 12/04/2012 10:32 AM by West Bali, MD     PT HAS ACUTE FLARE IN L LOWER LUMBAR REGION(PARA-SPINOUS). DIFFERENTIAL DIAGNOSIS  INCLUDES EARLY ONSET SHINGLES OR MS STRAIN.  ICE TO PAINFUL AREA TID FOR 10 MINS FOR 7 DAYS IBUPROFEN TID FOR 7 DAYS OPV W/ FREE CLINIC IF SX NOT IMPROVED.    Arteriosclerotic cardiovascular disease (ASCVD)   Last Assessment & Plan   09/30/2012 Office Visit Written 09/30/2012 12:34 PM by Kathlen Brunswick, MD     No symptoms now to suggest progression of coronary disease.    Spinal stenosis of lumbar region with neurogenic claudication   Last Assessment & Plan   09/30/2012 Office Visit Written 09/30/2012 12:35 PM by Kathlen Brunswick, MD      Back discomfort and ability to ambulate has improved with physical therapy. Patient is very grateful to Dr. Romeo Apple for his assistance.    Unspecified vitamin D deficiency   Abdominal pain, chronic, epigastric   Last Assessment & Plan   12/04/2012 Office Visit Written 12/04/2012 10:31 AM by West Bali, MD     PT HAS KNOWN HX OF NON-ULCER DYSPEPSIA.  ENCOURAGED HIM TO START PROZAC AT 10 MG DAILY. CONTINUE BID NEXIUM OPV IN 4 MOS    Atypical chest pain       Imaging: Dg Chest 2 View  11/25/2012   *RADIOLOGY REPORT*  Clinical Data: Chest pain  CHEST - 2 VIEW  Comparison: 01/08/2012  Findings: Cardiomediastinal contours are stable, within normal limits.  No confluent airspace opacity.  No pleural effusion or pneumothorax.  No acute osseous finding.  IMPRESSION: No radiographic evidence of acute cardiopulmonary process.   Original Report Authenticated By: Jearld Lesch, M.D.   US Abdomen Complete  11/20/2012   *RADIOLOGY REPORT*  Clinical Data:  Postprandial pain  ABDOMINAL ULTRASOUND COMPLETE  Comparison:  06/08/2009  Findings:  Gallbladder:  No gallstones, gallbladder wall thickening, or pericholecystic fluid.  Common Bile Duct:  Within normal limits in caliber.  Liver: Diffusely increased echogenicity throughout the liver is stable.  This is most consistent with diffuse hepatic steatosis. Relatively hypoechoic signal in the central right lobe  is stable compatible with focal fatty sparing.  IVC:  Appears normal.  Pancreas:  The pancreas was partially obscured.  No obvious mass.  Spleen:  Within normal limits in size and echotexture.  Right kidney:  Normal in size and parenchymal echogenicity.  No evidence of mass or hydronephrosis. Mild cortical atrophy.  Left kidney:  Normal in size and parenchymal echogenicity.  No evidence of mass or hydronephrosis. Mild cortical atrophy.  Abdominal Aorta:  No aneurysm identified.  IMPRESSION: Diffuse hepatic steatosis with relative sparing in the central right lobe is stable.  Pancreas was partially obscured without obvious mass.Mild chronic changes of the kidneys.   Original Report Authenticated By: Merton Border  Hoss, M.D.   Nm Hepato W/eject Fract  11/27/2012   *RADIOLOGY REPORT*  Clinical Data: Postprandial pain.  NUCLEAR MEDICINE HEPATOBILIARY WITH GB, PHARM AND QUAN MEASURE  Radiopharmaceutical:  5 mCi of technetium mebrofenin IV  1.83 mcg CCK IV.  The patient had some abdominal pain during CCK infusion.  Comparison: Ultrasound dated 11/20/2012  Findings: The gallbladder is visualized at 5 minutes.  Activity seen in the bowel at 20 minutes.  After administration of CCK and the ejection fraction was 90.7% at 26 minutes.  IMPRESSION: Normal hepatobiliary scan with an ejection fraction of 90.7% at 26 minutes.   Original Report Authenticated By: Francene Boyers, M.D.

## 2012-12-19 NOTE — Assessment & Plan Note (Signed)
No lipid profile obtained within the past year. A repeat assessment of serum lipids will be undertaken.

## 2012-12-19 NOTE — Assessment & Plan Note (Signed)
Patient reports "light" tobacco use.  He continues to consume 1/2 pack per day, but claims to not smoke much of each cigarette. I doubt a serious quit attempt could be contemplated at present.

## 2012-12-19 NOTE — Assessment & Plan Note (Addendum)
Blood pressure control remains excellent. Current therapy will be continued.  Recent metabolic profile indicated hypokalemia in the absence of an apparent medical cause for same.  Patient advised to consume increased amounts of potassium rich food and return for a Bmet in a few weeks.

## 2012-12-19 NOTE — Assessment & Plan Note (Signed)
Patient is not reporting much in the way of psychological distress of late, perhaps as a result of his multiple physical ailments.  He continues to receive care from behavioral health.

## 2012-12-24 ENCOUNTER — Telehealth: Payer: Self-pay | Admitting: *Deleted

## 2012-12-24 MED ORDER — LOVASTATIN 20 MG PO TABS: 20.0000 mg | ORAL_TABLET | Freq: Every day | ORAL | Status: DC

## 2012-12-24 NOTE — Telephone Encounter (Signed)
Noted incoming fax from patient pharmacy to request medication. Lovastatin 20 mg sent to [facility] in [place].

## 2013-01-03 ENCOUNTER — Ambulatory Visit (INDEPENDENT_AMBULATORY_CARE_PROVIDER_SITE_OTHER): Payer: Self-pay | Admitting: Psychiatry

## 2013-01-03 ENCOUNTER — Encounter (HOSPITAL_COMMUNITY): Payer: Self-pay | Admitting: Psychiatry

## 2013-01-03 VITALS — BP 126/68 | HR 53 | Ht 70.5 in | Wt 210.0 lb

## 2013-01-03 DIAGNOSIS — E559 Vitamin D deficiency, unspecified: Secondary | ICD-10-CM

## 2013-01-03 DIAGNOSIS — F17200 Nicotine dependence, unspecified, uncomplicated: Secondary | ICD-10-CM

## 2013-01-03 DIAGNOSIS — F411 Generalized anxiety disorder: Secondary | ICD-10-CM

## 2013-01-03 DIAGNOSIS — F329 Major depressive disorder, single episode, unspecified: Secondary | ICD-10-CM

## 2013-01-03 DIAGNOSIS — F332 Major depressive disorder, recurrent severe without psychotic features: Secondary | ICD-10-CM

## 2013-01-03 DIAGNOSIS — F410 Panic disorder [episodic paroxysmal anxiety] without agoraphobia: Secondary | ICD-10-CM

## 2013-01-03 DIAGNOSIS — G8929 Other chronic pain: Secondary | ICD-10-CM

## 2013-01-03 MED ORDER — ALPRAZOLAM 0.5 MG PO TABS: 0.5000 mg | ORAL_TABLET | Freq: Two times a day (BID) | ORAL | Status: DC | PRN

## 2013-01-03 MED ORDER — FLUOXETINE HCL 10 MG PO TABS: 15.0000 mg | ORAL_TABLET | Freq: Every day | ORAL | Status: DC
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
## 2013-01-03 NOTE — Patient Instructions (Addendum)
Check back with PCP on the Vitamin D and replacement for that.  Set a timer for 8 or a certain number minutes and walk for that amount of time in the house or in the yard.  Mark the number of minutes on a calendar for that day.  Do that every day this week.  Then next week increase the time by 1 minutes and then mark the calendar with the number of minutes for that day.  Each week increase your exercise by one minute.  Keep a record of this so you can see the progress you are making.  Do this every day, just like eating and sleeping.  It is good for pain control, depression, and for your soul/spirit.  Bring the record in for your next visit so we can talk about your effort and how you feel with the new exercise program going and working for you.  Relaxation is the ultimate solution for you.  You can seek it through tub baths, bubble baths, essential oils or incense, walking or chatting with friends, listening to soft music, watching a candle burn and just letting all thoughts go and appreciating the true essence of the Creator.  Pets or animals may be very helpful.  You might spend some time with them and then go do more directed meditation.  "I am Wishes Fulfilled Meditation" by Marylene Buerger and Lyndal Pulley may be helpful MUSIC for getting to sleep or for meditating You can order it from on line.  You might find the Chill channel on Pandora and explore the artists that you like better.   Call if problems or concerns.

## 2013-01-10 ENCOUNTER — Encounter (HOSPITAL_COMMUNITY): Payer: Self-pay | Admitting: Psychology

## 2013-01-10 ENCOUNTER — Ambulatory Visit (INDEPENDENT_AMBULATORY_CARE_PROVIDER_SITE_OTHER): Payer: Self-pay | Admitting: Psychology

## 2013-01-10 DIAGNOSIS — F411 Generalized anxiety disorder: Secondary | ICD-10-CM

## 2013-01-10 DIAGNOSIS — F419 Anxiety disorder, unspecified: Secondary | ICD-10-CM

## 2013-01-10 DIAGNOSIS — F4001 Agoraphobia with panic disorder: Secondary | ICD-10-CM

## 2013-01-10 NOTE — Progress Notes (Signed)
Patient:  Richard [name]   DOB: July 04, 1966  MR Number: [id number]  Location: [facility] [address] [place] [address] Dept: [phone]  Start: 11 AM End: 12 PM  Provider/Observer:     Hershal Coria PSYD  Chief Complaint:      Chief Complaint  Patient presents with  . Anxiety  . Panic Attack    Reason For Service:    The patient was referred by Dr. Dietrich Pates because of severe anxiety and foot appear to be panic attacks. The patient reports that he has been having a lot of anxiety and panic attacks. He has numerous medical issues including severe issues with diabetes, COPD, fatty liver disease and orthopedic issues. He also has had a long history of depression anxiety as well. Anxiety and depression have been going on for least 5 years and correlate with his health issues and financial difficulties. The patient relates this deterioration to an injury at work where he injured both of his shoulders while pulling a pallet. He had surgery on one of his shoulders initially and he experiences around his injury and surgery began to develop panic attacks. The patient is also been diagnosed with COPD and diabetes but continues to smoke and fears that if he completely quit smoking that he will gain a lot of weight again.   Interventions Strategy:  Cognitive/behavioral psychotherapeutic interventions  Participation Level:   Active  Participation Quality:  Appropriate      Behavioral Observation:  Fairly Groomed, Alert, and Appropriate.   Current Psychosocial Factors: The patient reports that he ran out of his Xanax recently and was trying to use less of it so he waited some time to get it refilled. The patient reports that after a couple of days he started having extremely frequent panic attacks. By the fourth day he had 4or5 panic attacks by noon. The patient has seen Dr. walker who is refilled his  Xanax and we have continued work on long-term results are efforts..  Content of Session:   Review current symptoms and continued work on therapeutic interventions around numerous medical issues and panic attacks.  Current Status:   The patient reports that a recent lack of benzodiazepine resulted in very frequent and intense panic attacks.  Patient Progress:   Stable  Target Goals:   Target goals include reduce the intensity, duration, and frequency of panic attacks, reduce the amount of benzodiazepine or other potentially dependence forming medications.  Last Reviewed:   01/10/2013  Goals Addressed Today:    Today we worked on issues of reducing the intensity and frequency of panic attacks  Impression/Diagnosis:   the patient appears to be doing with panic attacks along with anxiety and depression. He has numerous medical issues including COPD, diabetes, and other issues as well as liver disease. The patient is not taking very good care of himself and it was stressed specifically that he is going to need to quit smoking cigarettes and completely stopped caffeinated and sugar drinks if he is going to have any hope of improving his quality of life. The patient was amenable to initially try to stop the caffeinated drinks and we developed a plan for this. After that we will begin to address the smoking.    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

## 2013-01-13 ENCOUNTER — Other Ambulatory Visit: Payer: Self-pay | Admitting: *Deleted

## 2013-01-13 MED ORDER — LISINOPRIL 20 MG PO TABS: ORAL_TABLET | ORAL | Status: DC

## 2013-01-20 ENCOUNTER — Telehealth (HOSPITAL_COMMUNITY): Payer: Self-pay | Admitting: Psychiatry

## 2013-01-20 NOTE — Telephone Encounter (Signed)
Per Dr. Lucianne Muss, patient needs to contact primary medical MD for lab orders for Vitamin D level. Cabin crew in  [place] office to notify pt of this.

## 2013-01-23 ENCOUNTER — Ambulatory Visit: Payer: Self-pay | Admitting: Gastroenterology

## 2013-01-30 ENCOUNTER — Encounter (HOSPITAL_COMMUNITY): Payer: Self-pay | Admitting: Psychiatry

## 2013-01-30 ENCOUNTER — Ambulatory Visit (HOSPITAL_COMMUNITY): Payer: Self-pay | Admitting: Psychiatry

## 2013-01-30 ENCOUNTER — Ambulatory Visit (INDEPENDENT_AMBULATORY_CARE_PROVIDER_SITE_OTHER): Payer: Self-pay | Admitting: Psychiatry

## 2013-01-30 VITALS — Wt 201.0 lb

## 2013-01-30 DIAGNOSIS — F419 Anxiety disorder, unspecified: Secondary | ICD-10-CM

## 2013-01-30 DIAGNOSIS — F411 Generalized anxiety disorder: Secondary | ICD-10-CM

## 2013-01-30 NOTE — Progress Notes (Signed)
[facility] [address] Progress Note ARLIN SASS MRN: [id number] DOB: Mar 02, 1966 Age: 47 y.o.  Date: 01/30/2013  Chief Complaint  Patient presents with  . Follow-up  . Medication Refill   History of Chief Complaint:   This 47 year old Caucasian male who came for his followup appointment.  He is taking Prozac 10 mg only.  He is scared to take 20 mg.  However he likes to Prozac.  He denied any side effects.  He continues to have anxiety and nervousness however he sleeping better.  Denies any recent crying spells.  He still has panic attack but they're less intense and less frequent from the past.  He has any active or passive suicidal thoughts.  He is taking Xanax as prescribed.  He is not drinking or using any illegal substance.  He is unable to get vitamin D level because [facility] and that requires a prescription from the psychiatrist.  Anxiety Presents for follow-up visit. Symptoms include nervous/anxious behavior.     Review of Systems  Constitutional: Positive for fatigue.  Gastrointestinal: Negative.   Musculoskeletal: Positive for back pain.  Skin: Negative.   Psychiatric/Behavioral: Positive for sleep disturbance and dysphoric mood. The patient is nervous/anxious.     Physical Exam Vitals: Wt 201 lb (91.173 kg)  BMI 28.42 kg/m2   Past Psychiatric History: Diagnosis: none  Hospitalizations: none  Outpatient Care: none  Substance Abuse Care: none  Self-Mutilation: none  Suicidal Attempts: none  Violent Behaviors: none   Allergies: Allergies  Allergen Reactions  . Mushroom Ext Cmplx(Shiitake-Reishi-Mait) Anaphylaxis    Rapid heart rate.  . Penicillins     REACTION: Unknown reaction   Medical History: Past Medical History  Diagnosis Date  . Chest pain     + palpitations; cath 2005- 30-40% mid LAD, 20% D1, 20% cx, OM, 20-30% RCA, and EF-55%  . COPD (chronic obstructive pulmonary disease)   . GERD (gastroesophageal reflux disease)   .  Hyperlipemia   . Hypertension   . Depression   . Colitis 1990  . Tobacco abuse     1/2 pack per day  . Diabetes mellitus   . Gastric ulcer 2003; 2012    2003: + esophagitis; negative H.pylori serology  2012: Dr. Darrick Penna, mild gastritis, Bravo PH probe placement, negative H.pylori  . Hepatic steatosis   . Chronic low back pain   . Panic attacks   . Asthma    Surgical History: Past Surgical History  Procedure Laterality Date  . Colonoscopy  1990  . Shoulder surgery      Right acromioclavicular joint arthrosis  . Neck mass excision    . Bravo ph study  05/03/2011    Mild gastritis/normal esophagus and duodenum  . Cardiac catheterization  2005   Family History: family history includes ADD / ADHD in his daughter; Alcohol abuse in his father and mother; Anxiety disorder in his sisters; Dementia in his cousin and paternal uncle; Depression in his sister; Diabetes in his brother and father; Heart attack (age of onset: 68) in his brother; Heart attack (age of onset: 7) in his father; Hypertension in his brothers; Lung cancer in his mother; and Seizures in his brother.  There is no history of Colon cancer, and Drug abuse, and Bipolar disorder, and OCD, and Paranoid behavior, and Schizophrenia, and Sexual abuse, and Physical abuse, . Reviewed again today and nothing new.   Current Medications:  Current Outpatient Prescriptions  Medication Sig Dispense Refill  . albuterol (VENTOLIN HFA)  108 (90 BASE) MCG/ACT inhaler Inhale 2 puffs into the lungs every 6 (six) hours as needed for wheezing or shortness of breath.       . ALPRAZolam (XANAX) 0.5 MG tablet Take 1 tablet (0.5 mg total) by mouth 2 (two) times daily as needed for sleep or anxiety.  60 tablet  1  . aspirin 81 MG tablet Take 81 mg by mouth at bedtime.       . clindamycin (CLEOCIN) 150 MG capsule Take 150 mg by mouth every 8 (eight) hours.      [name] esomeprazole (NEXIUM) 40 MG capsule Take 40 mg by mouth 2 (two) times daily.      [name]  FLUoxetine (PROZAC) 10 MG tablet Take 1.5-2 tablets (15-20 mg total) by mouth daily.  60 tablet  2  . HYDROcodone-acetaminophen (NORCO/VICODIN) 5-325 MG per tablet Take 1 tablet by mouth every 6 (six) hours as needed for pain.      [name] ibuprofen (ADVIL,MOTRIN) 600 MG tablet 1 PO TID FOR 7 DAYS  21 tablet  0  . lisinopril (PRINIVIL,ZESTRIL) 20 MG tablet TAKE ONE TABLET BY MOUTH EVERY DAY  30 tablet  5  . lovastatin (MEVACOR) 20 MG tablet Take 1 tablet (20 mg total) by mouth at bedtime.  30 tablet  6  . metFORMIN (GLUCOPHAGE) 500 MG tablet Take 500 mg by mouth 2 (two) times daily with a meal.        . metoprolol succinate (TOPROL-XL) 50 MG 24 hr tablet Take 1.5 tablets = 75mg  daily  45 tablet  6  . nitroGLYCERIN (NITROSTAT) 0.4 MG SL tablet Place 0.4 mg under the tongue every 5 (five) minutes as needed.      . ondansetron (ZOFRAN) 8 MG tablet Take 1 tablet (8 mg total) by mouth every 8 (eight) hours as needed for nausea.  5 tablet  0  . acetaminophen (TYLENOL) 325 MG tablet Take 650 mg by mouth every 6 (six) hours as needed for pain or fever. For pain      . Fluticasone-Salmeterol (ADVAIR) 250-50 MCG/DOSE AEPB Inhale 1 puff into the lungs every 12 (twelve) hours.       No current facility-administered medications for this visit.    Previous Psychotropic Medications: Medication Dose   Xanax     Substance Abuse History in the last 12 months: Substance Age of 1st Use Last Use Amount Specific Type  Nicotine  17  2 hours ago  1  cigarette  Alcohol  22  38      Cannabis  none        Opiates  36  today  2.5 mg  hyrdocodone  Cocaine  none        Methamphetamines  none        LSD  none        Ecstasy  none         Benzodiazepines  22  started in early thrities  0.5 mg  Xanax  Caffeine  childhood  this AM  1 cup  Mt Dew  Inhalants  none        Others:      Sugar  11  this AM  20 tsps  in the [facility]  Medical Consequences of Substance Abuse: pain Legal Consequences of Substance Abuse: none Family  Consequences of Substance Abuse: none Blackouts:  No DT's:  No Withdrawal Symptoms:  Yes Tremors  Social History: Patient lives with his life, daughter and her husband with 2 grandbabies.  He has 2 sons and the daughter.  He is a Engineer, agricultural.    Mental Status Examination/Evaluation: Objective:  Appearance: Casual  Eye Contact::  Good  Speech:  Clear and Coherent  Volume:  Normal  Mood:  worried, anxious   Affect:  Congruent  Thought Process:  Coherent, Intact and Linear  Orientation:  Full (Time, Place, and Person)  Thought Content:  WDL  Suicidal Thoughts:  No  Homicidal Thoughts:  No  Judgement:  Good  Insight:  Fair  Psychomotor Activity:  Normal  Akathisia:  No  Handed:  Right  AIMS (if indicated):    Assets:  Communication Skills Desire for Improvement   Lab Results:  Results for orders placed during the hospital encounter of 11/25/12 (from the past 8736 hour(s))  TROPONIN I   Collection Time    11/25/12  1:30 AM      Result Value Range   Troponin I <0.30  <0.30 ng/mL  CBC WITH DIFFERENTIAL   Collection Time    11/25/12  1:30 AM      Result Value Range   WBC 9.3  4.0 - 10.5 K/uL   RBC 4.92  4.22 - 5.81 MIL/uL   Hemoglobin 15.5  13.0 - 17.0 g/dL   HCT 16.1  09.6 - 04.5 %   MCV 87.4  78.0 - 100.0 fL   MCH 31.5  26.0 - 34.0 pg   MCHC 36.0  30.0 - 36.0 g/dL   RDW 40.9  81.1 - 91.4 %   Platelets 230  150 - 400 K/uL   Neutrophils Relative % 36 (*) 43 - 77 %   Neutro Abs 3.4  1.7 - 7.7 K/uL   Lymphocytes Relative 56 (*) 12 - 46 %   Lymphs Abs 5.2 (*) 0.7 - 4.0 K/uL   Monocytes Relative 6  3 - 12 %   Monocytes Absolute 0.6  0.1 - 1.0 K/uL   Eosinophils Relative 2  0 - 5 %   Eosinophils Absolute 0.2  0.0 - 0.7 K/uL   Basophils Relative 0  0 - 1 %   Basophils Absolute 0.0  0.0 - 0.1 K/uL  BASIC METABOLIC PANEL   Collection Time    11/25/12  1:30 AM      Result Value Range   Sodium 135  135 - 145 mEq/L   Potassium 3.0 (*) 3.5 - 5.1 mEq/L   Chloride  97  96 - 112 mEq/L   CO2 25  19 - 32 mEq/L   Glucose, Bld 114 (*) 70 - 99 mg/dL   BUN 17  6 - 23 mg/dL   Creatinine, Ser 7.82  0.50 - 1.35 mg/dL   Calcium 9.4  8.4 - 95.6 mg/dL   GFR calc non Af Amer 87 (*) >90 mL/min   GFR calc Af Amer >90  >90 mL/min  Results for orders placed in visit on 11/20/12 (from the past 8736 hour(s))  COMPREHENSIVE METABOLIC PANEL   Collection Time    10/23/12 12:00 AM      Result Value Range   WBC 8.2     Hemoglobin 15.4  13.5 - 17.5 g/dL   HCT 44     platelet count 292    COMPREHENSIVE METABOLIC PANEL   Collection Time    10/23/12 12:00 AM      Result Value Range   Sodium 137  137 - 147 mmol/L   Potassium 4.3     CO2 25     Glucose 131  BUN 15  4 - 21 mg/dL   Creat 8.29     Total Bilirubin 0.4     Alkaline Phosphatase 45     AST 11     ALT 29  10 - 40 U/L   Albumin 4.0     Calcium 9.0     Hemoglobin A1C 6.9 (*) 4.0 - 6.0 %  Results for orders placed during the hospital encounter of 10/14/12 (from the past 8736 hour(s))  CBC WITH DIFFERENTIAL   Collection Time    10/14/12  3:50 PM      Result Value Range   WBC 10.6 (*) 4.0 - 10.5 K/uL   RBC 5.75  4.22 - 5.81 MIL/uL   Hemoglobin 17.9 (*) 13.0 - 17.0 g/dL   HCT 56.2  13.0 - 86.5 %   MCV 88.2  78.0 - 100.0 fL   MCH 31.1  26.0 - 34.0 pg   MCHC 35.3  30.0 - 36.0 g/dL   RDW 78.4  69.6 - 29.5 %   Platelets 223  150 - 400 K/uL   Neutrophils Relative % 89 (*) 43 - 77 %   Neutro Abs 9.4 (*) 1.7 - 7.7 K/uL   Lymphocytes Relative 7 (*) 12 - 46 %   Lymphs Abs 0.8  0.7 - 4.0 K/uL   Monocytes Relative 4  3 - 12 %   Monocytes Absolute 0.4  0.1 - 1.0 K/uL   Eosinophils Relative 0  0 - 5 %   Eosinophils Absolute 0.0  0.0 - 0.7 K/uL   Basophils Relative 0  0 - 1 %   Basophils Absolute 0.0  0.0 - 0.1 K/uL  COMPREHENSIVE METABOLIC PANEL   Collection Time    10/14/12  3:50 PM      Result Value Range   Sodium 140  135 - 145 mEq/L   Potassium 3.9  3.5 - 5.1 mEq/L   Chloride 97  96 - 112 mEq/L    CO2 27  19 - 32 mEq/L   Glucose, Bld 132 (*) 70 - 99 mg/dL   BUN 29 (*) 6 - 23 mg/dL   Creatinine, Ser 2.84  0.50 - 1.35 mg/dL   Calcium 9.9  8.4 - 13.2 mg/dL   Total Protein 8.3  6.0 - 8.3 g/dL   Albumin 4.4  3.5 - 5.2 g/dL   AST 11  0 - 37 U/L   ALT 19  0 - 53 U/L   Alkaline Phosphatase 70  39 - 117 U/L   Total Bilirubin 0.4  0.3 - 1.2 mg/dL   GFR calc non Af Amer 65 (*) >90 mL/min   GFR calc Af Amer 75 (*) >90 mL/min  GLUCOSE, CAPILLARY   Collection Time    10/14/12  3:56 PM      Result Value Range   Glucose-Capillary 118 (*) 70 - 99 mg/dL   Comment 1 Documented in Chart     Comment 2 Notify RN    LIPASE, BLOOD   Collection Time    10/14/12  4:50 PM      Result Value Range   Lipase 14  11 - 59 U/L  URINALYSIS, ROUTINE W REFLEX MICROSCOPIC   Collection Time    10/14/12  6:00 PM      Result Value Range   Color, Urine YELLOW  YELLOW   APPearance CLEAR  CLEAR   Specific Gravity, Urine >1.030 (*) 1.005 - 1.030   pH 6.0  5.0 - 8.0   Glucose, UA  NEGATIVE  NEGATIVE mg/dL   Hgb urine dipstick NEGATIVE  NEGATIVE   Bilirubin Urine SMALL (*) NEGATIVE   Ketones, ur NEGATIVE  NEGATIVE mg/dL   Protein, ur TRACE (*) NEGATIVE mg/dL   Urobilinogen, UA 0.2  0.0 - 1.0 mg/dL   Nitrite NEGATIVE  NEGATIVE   Leukocytes, UA NEGATIVE  NEGATIVE  URINE MICROSCOPIC-ADD ON   Collection Time    10/14/12  6:00 PM      Result Value Range   WBC, UA 0-2  <3 WBC/hpf   Bacteria, UA RARE  RARE   Casts HYALINE CASTS (*) NEGATIVE   Urine-Other MUCOUS PRESENT    Results for orders placed in visit on 08/20/12 (from the past 8736 hour(s))  VITAMIN D 25 HYDROXY   Collection Time    10/23/12  8:20 AM      Result Value Range   Vit D, 25-Hydroxy 41  30 - 89 ng/mL    Assessment:   AXIS I Generalized Anxiety Disorder, Major Depression, Recurrent severe and Panic Disorder  AXIS II Deferred  AXIS III Past Medical History  Diagnosis Date  . Chest pain     + palpitations; cath 2005- 30-40% mid LAD, 20%  D1, 20% cx, OM, 20-30% RCA, and EF-55%  . COPD (chronic obstructive pulmonary disease)   . GERD (gastroesophageal reflux disease)   . Hyperlipemia   . Hypertension   . Depression   . Colitis 1990  . Tobacco abuse     1/2 pack per day  . Diabetes mellitus   . Gastric ulcer 2003; 2012    2003: + esophagitis; negative H.pylori serology  2012: Dr. Darrick Penna, mild gastritis, Bravo PH probe placement, negative H.pylori  . Hepatic steatosis   . Chronic low back pain   . Panic attacks   . Asthma      AXIS IV other psychosocial or environmental problems  AXIS V 41-50 serious symptoms   Treatment Plan/Recommendations: Laboratory:  Vitamin D  Psychotherapy: supportive psychotherapy  Medications: Cymbalta, Neurontin, Inderal, Antiinflammatory gel   Routine PRN Medications:  No  Consultations: none  Safety Concerns:  none  Other:     Plan: Recommend to try Prozac 20 mg daily.  Patient still has refill remaining on his Prozac and Xanax.  We will provide a prescription for his vitamin D level.  Risk and benefits of medication discussed in detail.  Recommend to see therapist for coping and social skills.  Followup in 4 weeks  MEDICATIONS this encounter: No orders of the defined types were placed in this encounter.   Medical Decision Making Problem Points:  Established problem, stable/improving (1), Review of last therapy session (1) and Review of psycho-social stressors (1) Data Points:  Review or order clinical lab tests (1) Review of medication regiment & side effects (2) Review of new medications or change in dosage (2)  I certify that outpatient services furnished can reasonably be expected to improve the patient's condition.   Brytni Dray T., MD

## 2013-01-31 ENCOUNTER — Ambulatory Visit (HOSPITAL_COMMUNITY): Payer: Self-pay | Admitting: Psychiatry

## 2013-02-05 ENCOUNTER — Encounter (HOSPITAL_COMMUNITY): Payer: Self-pay | Admitting: Emergency Medicine

## 2013-02-05 ENCOUNTER — Emergency Department (HOSPITAL_COMMUNITY): Payer: Self-pay

## 2013-02-05 ENCOUNTER — Emergency Department (HOSPITAL_COMMUNITY)
Admission: EM | Admit: 2013-02-05 | Discharge: 2013-02-05 | Disposition: A | Discharge status: Home / Self Care | Payer: Self-pay | Attending: Emergency Medicine | Admitting: Emergency Medicine

## 2013-02-05 DIAGNOSIS — K219 Gastro-esophageal reflux disease without esophagitis: Secondary | ICD-10-CM | POA: Insufficient documentation

## 2013-02-05 DIAGNOSIS — E119 Type 2 diabetes mellitus without complications: Secondary | ICD-10-CM | POA: Insufficient documentation

## 2013-02-05 DIAGNOSIS — F329 Major depressive disorder, single episode, unspecified: Secondary | ICD-10-CM | POA: Insufficient documentation

## 2013-02-05 DIAGNOSIS — Z880 Allergy status to penicillin: Secondary | ICD-10-CM | POA: Insufficient documentation

## 2013-02-05 DIAGNOSIS — F410 Panic disorder [episodic paroxysmal anxiety] without agoraphobia: Secondary | ICD-10-CM | POA: Insufficient documentation

## 2013-02-05 DIAGNOSIS — Z79899 Other long term (current) drug therapy: Secondary | ICD-10-CM | POA: Insufficient documentation

## 2013-02-05 DIAGNOSIS — G8929 Other chronic pain: Secondary | ICD-10-CM | POA: Insufficient documentation

## 2013-02-05 DIAGNOSIS — J449 Chronic obstructive pulmonary disease, unspecified: Secondary | ICD-10-CM | POA: Insufficient documentation

## 2013-02-05 DIAGNOSIS — J45909 Unspecified asthma, uncomplicated: Secondary | ICD-10-CM | POA: Insufficient documentation

## 2013-02-05 DIAGNOSIS — Z8719 Personal history of other diseases of the digestive system: Secondary | ICD-10-CM | POA: Insufficient documentation

## 2013-02-05 DIAGNOSIS — R079 Chest pain, unspecified: Secondary | ICD-10-CM | POA: Insufficient documentation

## 2013-02-05 DIAGNOSIS — F3289 Other specified depressive episodes: Secondary | ICD-10-CM | POA: Insufficient documentation

## 2013-02-05 DIAGNOSIS — R0602 Shortness of breath: Secondary | ICD-10-CM | POA: Insufficient documentation

## 2013-02-05 DIAGNOSIS — J4489 Other specified chronic obstructive pulmonary disease: Secondary | ICD-10-CM | POA: Insufficient documentation

## 2013-02-05 DIAGNOSIS — E785 Hyperlipidemia, unspecified: Secondary | ICD-10-CM | POA: Insufficient documentation

## 2013-02-05 DIAGNOSIS — I10 Essential (primary) hypertension: Secondary | ICD-10-CM | POA: Insufficient documentation

## 2013-02-05 LAB — CBC WITH DIFFERENTIAL/PLATELET
Basophils Absolute: 0 10*3/uL (ref 0.0–0.1)
Eosinophils Absolute: 0.2 10*3/uL (ref 0.0–0.7)
Eosinophils Relative: 3 % (ref 0–5)
MCH: 31.9 pg (ref 26.0–34.0)
MCV: 89.2 fL (ref 78.0–100.0)
Neutrophils Relative %: 54 % (ref 43–77)
Platelets: 221 10*3/uL (ref 150–400)
RDW: 12.9 % (ref 11.5–15.5)
WBC: 6.4 10*3/uL (ref 4.0–10.5)

## 2013-02-05 LAB — COMPREHENSIVE METABOLIC PANEL
ALT: 14 U/L (ref 0–53)
AST: 11 U/L (ref 0–37)
Albumin: 3.7 g/dL (ref 3.5–5.2)
Calcium: 9.3 mg/dL (ref 8.4–10.5)
GFR calc Af Amer: >90 mL/min (ref >90)
Sodium: 138 mEq/L (ref 135–145)
Total Protein: 6.5 g/dL (ref 6.0–8.3)

## 2013-02-05 MED ORDER — ASPIRIN 81 MG PO CHEW
324.0000 mg | CHEWABLE_TABLET | Freq: Once | ORAL | Status: AC
  Administered 2013-02-05: 324 mg via ORAL
  Filled 2013-02-05: qty 4

## 2013-02-05 MED ORDER — ALPRAZOLAM 0.5 MG PO TABS
0.5000 mg | ORAL_TABLET | Freq: Once | ORAL | Status: AC
  Administered 2013-02-05: 0.5 mg via ORAL
  Filled 2013-02-05: qty 1

## 2013-02-05 MED ORDER — SODIUM CHLORIDE 0.9 % IV SOLN
Freq: Once | INTRAVENOUS | Status: AC
  Administered 2013-02-05: 20 mL/h via INTRAVENOUS

## 2013-02-05 NOTE — ED Notes (Signed)
Pt presents with SOB, and chest pain that began suddenly this afternoon after becoming over heated while working on a tire. Pt states talking increases his SOB. Chest pain is mid-sternal that radiates across his chest. Pt denies radiation of pain into arm, jaw,and shoulder. Pt reports some mild nausea. Pt takes 81 mg asa daily. Also took a nitro tablet when pain began. EKG completed, PIV started.

## 2013-02-05 NOTE — ED Notes (Signed)
States that he started having chest pain and shortness of breath about 30 minutes prior to arrival.  States he took 1 nitro with minimal relief at home. States he did have nausea at home, but no vomiting.

## 2013-02-05 NOTE — ED Provider Notes (Signed)
History    CSN: [id number] Arrival date & time 02/05/13  1233  First MD Initiated Contact with Patient 02/05/13 1436     Chief Complaint  Patient presents with  . Chest Pain  . Shortness of Breath   (Consider location/radiation/quality/duration/timing/severity/associated sxs/prior Treatment) HPI Comments: Richard [name] is a 47 y.o. Male presenting with chest pain and shortness of breath which started about 30 minutes before arrival here while he was hammering a tire into a tire rim. He had been working outdoors in his yard as he does most mornings, and was sitting in the shade when the symptoms began.  He had transient nausea which has resolved.  He denies diaphoresis,  Dizziness,  Abdominal pain and headache, but felt like the "air got sucked out of him".  He took 1 nitroglycerin without relief of symptoms.  He does have a history of panic attacks, often unprovoked and his symptoms today are similar to these episodes,  He also reports not taking his normal morning xanax today.  However,  He also does have known coronary disease,  Having a cardiac cath in 2005,  And his last cardiolyte stress test in 2009 with no abnormal findings.  He last saw his cardiologist one month ago. He describes having difficulty sleeping at night,  Stating he startles himself awake most of the night, with the feeling that he was holding his breath. He has not discussed this with his cardiologist.       The history is provided by the patient.   Past Medical History  Diagnosis Date  . Chest pain     + palpitations; cath 2005- 30-40% mid LAD, 20% D1, 20% cx, OM, 20-30% RCA, and EF-55%  . COPD (chronic obstructive pulmonary disease)   . GERD (gastroesophageal reflux disease)   . Hyperlipemia   . Hypertension   . Depression   . Colitis 1990  . Tobacco abuse     1/2 pack per day  . Diabetes mellitus   . Gastric ulcer 2003; 2012    2003: + esophagitis; negative H.pylori serology  2012: Dr. Darrick Penna, mild  gastritis, Bravo PH probe placement, negative H.pylori  . Hepatic steatosis   . Chronic low back pain   . Panic attacks   . Asthma    Past Surgical History  Procedure Laterality Date  . Colonoscopy  1990  . Shoulder surgery      Right acromioclavicular joint arthrosis  . Neck mass excision    . Bravo ph study  05/03/2011    Mild gastritis/normal esophagus and duodenum  . Cardiac catheterization  2005   Family History  Problem Relation Age of Onset  . Lung cancer Mother   . Alcohol abuse Mother   . Heart attack Father 17  . Diabetes Father   . Alcohol abuse Father   . Hypertension Brother   . Colon cancer Neg Hx   . Drug abuse Neg Hx   . Bipolar disorder Neg Hx   . OCD Neg Hx   . Paranoid behavior Neg Hx   . Schizophrenia Neg Hx   . Sexual abuse Neg Hx   . Physical abuse Neg Hx   . Hypertension Brother   . Anxiety disorder Sister   . Depression Sister   . Dementia Paternal Uncle   . Dementia Cousin   . Anxiety disorder Sister   . Heart attack Brother 38  . Diabetes Brother   . Hypertension Brother   . Seizures Brother   .  ADD / ADHD Daughter    History  Substance Use Topics  . Smoking status: Current Every Day Smoker -- 0.50 packs/day for 29 years    Types: Cigarettes  . Smokeless tobacco: Never Used     Comment: on the verge of quitting as of 01/03/2013  . Alcohol Use: No    Review of Systems  Constitutional: Negative for fever.  HENT: Negative for congestion, sore throat and neck pain.   Eyes: Negative.   Respiratory: Positive for chest tightness and shortness of breath. Negative for cough, wheezing and stridor.   Cardiovascular: Positive for chest pain. Negative for palpitations and leg swelling.  Gastrointestinal: Negative for nausea and abdominal pain.  Genitourinary: Negative.   Musculoskeletal: Negative for joint swelling and arthralgias.  Skin: Negative.  Negative for rash and wound.  Neurological: Negative for dizziness, weakness,  light-headedness, numbness and headaches.  Psychiatric/Behavioral: Negative.     Allergies  Mushroom ext cmplx(shiitake-reishi-mait) and Penicillins  Home Medications   Current Outpatient Rx  Name  Route  Sig  Dispense  Refill  . acetaminophen (TYLENOL) 325 MG tablet   Oral   Take 650 mg by mouth every 6 (six) hours as needed for pain or fever.          [name] albuterol (VENTOLIN HFA) 108 (90 BASE) MCG/ACT inhaler   Inhalation   Inhale 2 puffs into the lungs every 6 (six) hours as needed for wheezing or shortness of breath.          . ALPRAZolam (XANAX) 0.5 MG tablet   Oral   Take 1 tablet (0.5 mg total) by mouth 2 (two) times daily as needed for sleep or anxiety.   60 tablet   1   . aspirin 81 MG tablet   Oral   Take 81 mg by mouth at bedtime.          [name] esomeprazole (NEXIUM) 40 MG capsule   Oral   Take 40 mg by mouth 2 (two) times daily.         [name] FLUoxetine (PROZAC) 10 MG tablet   Oral   Take 10 mg by mouth daily.         . Fluticasone-Salmeterol (ADVAIR) 250-50 MCG/DOSE AEPB   Inhalation   Inhale 1 puff into the lungs every 12 (twelve) hours.         [name] HYDROcodone-acetaminophen (NORCO/VICODIN) 5-325 MG per tablet   Oral   Take 1 tablet by mouth every 6 (six) hours as needed for pain.         [name] lisinopril (PRINIVIL,ZESTRIL) 20 MG tablet   Oral   Take 20 mg by mouth daily.         [name] lovastatin (MEVACOR) 20 MG tablet   Oral   Take 1 tablet (20 mg total) by mouth at bedtime.   30 tablet   6   . metFORMIN (GLUCOPHAGE) 500 MG tablet   Oral   Take 500 mg by mouth 2 (two) times daily with a meal.           . metoprolol succinate (TOPROL-XL) 50 MG 24 hr tablet   Oral   Take 75 mg by mouth daily.         . nitroGLYCERIN (NITROSTAT) 0.4 MG SL tablet   Sublingual   Place 0.4 mg under the tongue every 5 (five) minutes as needed for chest pain.           BP 109/62  Pulse 53  Temp(Src) 97.6 F (  36.4 C) (Oral)  Resp 22  Wt 197 lb (89.359  kg)  BMI 27.86 kg/m2  SpO2 97% Physical Exam  Nursing note and vitals reviewed. Constitutional: He is oriented to person, place, and time. He appears well-developed and well-nourished.  HENT:  Head: Normocephalic and atraumatic.  Eyes: Conjunctivae are normal.  Neck: Normal range of motion.  Cardiovascular: Normal rate, regular rhythm, normal heart sounds and intact distal pulses.   Pulmonary/Chest: Effort normal and breath sounds normal. He has no wheezes. He has no rales. He exhibits no tenderness.  Abdominal: Soft. Bowel sounds are normal. He exhibits no distension. There is no tenderness.  Musculoskeletal: Normal range of motion.  Neurological: He is alert and oriented to person, place, and time.  Skin: Skin is warm and dry. He is not diaphoretic.  Psychiatric: His behavior is normal. Thought content normal. His mood appears anxious.    ED Course  Procedures (including critical care time)    Date: 02/05/2013  Rate: 68  Rhythm: normal sinus rhythm  QRS Axis: normal  Intervals: normal  ST/T Wave abnormalities: nonspecific T wave changes  Conduction Disutrbances:none  Narrative Interpretation:   Old EKG Reviewed: unchanged   Labs Reviewed  COMPREHENSIVE METABOLIC PANEL - Abnormal; Notable for the following:    Glucose, Bld 186 (*)    All other components within normal limits  TROPONIN I  CBC WITH DIFFERENTIAL  TROPONIN I   Dg Chest Portable 1 View  02/05/2013   *RADIOLOGY REPORT*  Clinical Data: Chest pain.  PORTABLE CHEST - 1 VIEW  Comparison: 11/25/2012.  Findings: Trachea is midline.  Heart size stable.  Lungs are clear. No pleural fluid.  IMPRESSION: No acute findings.   Original Report Authenticated By: Leanna Battles, M.D.   1. Chest pain syndrome     MDM  Pt was given xanax and ASA pending labs, with near complete relief of his symptoms.  Patients labs and/or radiological studies were viewed and considered during the medical decision making and disposition  process. Discussed with Dr. Lynelle Doctor. Pt was given xanax and his sx resolved, stating he still has his "chronic chest pain" which he has had for more than 20 years since an episode of vomiting. Pt stable for dc home with close f/u with Dr Dietrich Pates.  Pt knows to call for appt.  Discussed possible need for sleep study with patient if Dr Dietrich Pates feels would be helpful.  Sx (at least his nocturnal sx) could be sleep apnea.    The patient appears reasonably screened and/or stabilized for discharge and I doubt any other medical condition or other [facility] requiring further screening, evaluation, or treatment in the ED at this time prior to discharge.   Burgess Amor, PA-C 02/05/13 1737

## 2013-02-05 NOTE — Discharge Instructions (Signed)
Chest Pain (Nonspecific) °It is often hard to give a specific diagnosis for the cause of chest pain. There is always a chance that your pain could be related to something serious, such as a heart attack or a blood clot in the lungs. You need to follow up with your caregiver for further evaluation. °CAUSES  °· Heartburn. °· Pneumonia or bronchitis. °· Anxiety or stress. °· Inflammation around your heart (pericarditis) or lung (pleuritis or pleurisy). °· A blood clot in the lung. °· A collapsed lung (pneumothorax). It can develop suddenly on its own (spontaneous pneumothorax) or from injury (trauma) to the chest. °· Shingles infection (herpes zoster virus). °The chest wall is composed of bones, muscles, and cartilage. Any of these can be the source of the pain. °· The bones can be bruised by injury. °· The muscles or cartilage can be strained by coughing or overwork. °· The cartilage can be affected by inflammation and become sore (costochondritis). °DIAGNOSIS  °Lab tests or other studies, such as X-rays, electrocardiography, stress testing, or cardiac imaging, may be needed to find the cause of your pain.  °TREATMENT  °· Treatment depends on what may be causing your chest pain. Treatment may include: °· Acid blockers for heartburn. °· Anti-inflammatory medicine. °· Pain medicine for inflammatory conditions. °· Antibiotics if an infection is present. °· You may be advised to change lifestyle habits. This includes stopping smoking and avoiding alcohol, caffeine, and chocolate. °· You may be advised to keep your head raised (elevated) when sleeping. This reduces the chance of acid going backward from your stomach into your esophagus. °· Most of the time, nonspecific chest pain will improve within 2 to 3 days with rest and mild pain medicine. °HOME CARE INSTRUCTIONS  °· If antibiotics were prescribed, take your antibiotics as directed. Finish them even if you start to feel better. °· For the next few days, avoid physical  activities that bring on chest pain. Continue physical activities as directed. °· Do not smoke. °· Avoid drinking alcohol. °· Only take over-the-counter or prescription medicine for pain, discomfort, or fever as directed by your caregiver. °· Follow your caregiver's suggestions for further testing if your chest pain does not go away. °· Keep any follow-up appointments you made. If you do not go to an appointment, you could develop lasting (chronic) problems with pain. If there is any problem keeping an appointment, you must call to reschedule. °SEEK MEDICAL CARE IF:  °· You think you are having problems from the medicine you are taking. Read your medicine instructions carefully. °· Your chest pain does not go away, even after treatment. °· You develop a rash with blisters on your chest. °SEEK IMMEDIATE MEDICAL CARE IF:  °· You have increased chest pain or pain that spreads to your arm, neck, jaw, back, or abdomen. °· You develop shortness of breath, an increasing cough, or you are coughing up blood. °· You have severe back or abdominal pain, feel nauseous, or vomit. °· You develop severe weakness, fainting, or chills. °· You have a fever. °THIS IS AN EMERGENCY. Do not wait to see if the pain will go away. Get medical help at once. Call your local emergency services (911 in [place] Do not drive yourself to the hospital. °MAKE SURE YOU:  °· Understand these instructions. °· Will watch your condition. °· Will get help right away if you are not doing well or get worse. °Document Released: 04/12/2005 Document Revised: 09/25/2011 Document Reviewed: 02/06/2008 °[employer]® Patient Information ©2014 [employer].   Please call Dr. Dietrich Pates for further evaluation of your symptoms.  Your exam, your ekg, chest xray and labs are stable today.  It is possible your symptoms are from anxiety - please remember to take your xanax as prescribed.

## 2013-02-06 ENCOUNTER — Encounter: Payer: Self-pay | Admitting: *Deleted

## 2013-02-06 ENCOUNTER — Other Ambulatory Visit: Payer: Self-pay | Admitting: *Deleted

## 2013-02-06 ENCOUNTER — Ambulatory Visit (HOSPITAL_COMMUNITY): Payer: Self-pay | Admitting: Psychology

## 2013-02-06 DIAGNOSIS — R002 Palpitations: Secondary | ICD-10-CM

## 2013-02-06 DIAGNOSIS — F329 Major depressive disorder, single episode, unspecified: Secondary | ICD-10-CM

## 2013-02-06 DIAGNOSIS — F17200 Nicotine dependence, unspecified, uncomplicated: Secondary | ICD-10-CM

## 2013-02-06 DIAGNOSIS — I2510 Atherosclerotic heart disease of native coronary artery without angina pectoris: Secondary | ICD-10-CM

## 2013-02-06 DIAGNOSIS — I10 Essential (primary) hypertension: Secondary | ICD-10-CM

## 2013-02-06 DIAGNOSIS — E785 Hyperlipidemia, unspecified: Secondary | ICD-10-CM

## 2013-02-06 NOTE — ED Provider Notes (Signed)
Medical screening examination/treatment/procedure(s) were performed by non-physician practitioner and as supervising physician I was immediately available for consultation/collaboration. Devoria Albe, MD, FACEP   Ward Givens, MD 02/06/13 [phone]

## 2013-02-25 ENCOUNTER — Encounter: Payer: Self-pay | Admitting: *Deleted

## 2013-02-25 ENCOUNTER — Ambulatory Visit (INDEPENDENT_AMBULATORY_CARE_PROVIDER_SITE_OTHER): Payer: Self-pay | Admitting: Cardiovascular Disease

## 2013-02-25 ENCOUNTER — Encounter: Payer: Self-pay | Admitting: Cardiovascular Disease

## 2013-02-25 VITALS — BP 110/69 | HR 60 | Ht 71.0 in | Wt 200.0 lb

## 2013-02-25 DIAGNOSIS — F17200 Nicotine dependence, unspecified, uncomplicated: Secondary | ICD-10-CM

## 2013-02-25 DIAGNOSIS — R002 Palpitations: Secondary | ICD-10-CM

## 2013-02-25 DIAGNOSIS — I10 Essential (primary) hypertension: Secondary | ICD-10-CM

## 2013-02-25 DIAGNOSIS — E782 Mixed hyperlipidemia: Secondary | ICD-10-CM

## 2013-02-25 DIAGNOSIS — I25810 Atherosclerosis of coronary artery bypass graft(s) without angina pectoris: Secondary | ICD-10-CM

## 2013-02-25 DIAGNOSIS — R079 Chest pain, unspecified: Secondary | ICD-10-CM

## 2013-02-25 LAB — LIPID PANEL
HDL: 33 mg/dL — ABNORMAL LOW (ref >39)
LDL Cholesterol: 98 mg/dL (ref 0–99)
Total CHOL/HDL Ratio: 4.6 Ratio
Triglycerides: 106 mg/dL (ref <150)
VLDL: 21 mg/dL (ref 0–40)

## 2013-02-25 LAB — BASIC METABOLIC PANEL
CO2: 30 mEq/L (ref 19–32)
Calcium: 9.4 mg/dL (ref 8.4–10.5)
Creat: 1.07 mg/dL (ref 0.50–1.35)

## 2013-02-25 MED ORDER — LISINOPRIL 10 MG PO TABS: 10.0000 mg | ORAL_TABLET | Freq: Every day | ORAL | Status: DC

## 2013-02-25 NOTE — Patient Instructions (Addendum)
Your physician recommends that you schedule a follow-up appointment in: ONE YEAR WITH Dr. Purvis Sheffield   Your physician has recommended you make the following change in your medication:   1) DECREASE LISINOPRIL TO 10MG  ONCE DAILY  Your physician has requested that you have a lexiscan myoview. For further information please visit [email or web address] Please follow instruction sheet, as given.WE WILL CALL YOU WITH THE RESULTS

## 2013-02-25 NOTE — Progress Notes (Signed)
Patient ID: Richard [name], male   DOB: July 12, 1966, 47 y.o.   MRN: [id number]    SUBJECTIVE: Richard [name] has a h/o multiple medical problems, including mild to moderate CAD by cath in 2005, HTN, tobacco abuse, GERD, diabetes mellitus, chronic low back pain, anxiety/depression, palpitations, and hyperlipidemia. He has mild COPD by PFT's in October 2012.  He has a h/o chronic chest pain. He presented to the ED with chest pain and SOB in late July, and was deemed compatible with his known h/o panic attacks. He feels like he's been getting fatigued more easily, and this has gotten worse in the last few months.  When he is walking at [facility], he gets tired out rather easily, and feels shortness of breath. He's trying to cut back down on smoking, and is now using electronic cigarettes.  BP: 110/69 mmHg Pulse: 60 bpm   PHYSICAL EXAM General: NAD Neck: No JVD, no thyromegaly or thyroid nodule.  Lungs: Clear to auscultation bilaterally with normal respiratory effort. CV: Nondisplaced PMI.  Heart regular S1/S2, no S3/S4, no murmur.  No peripheral edema.  No carotid bruit.  Normal pedal pulses.  Abdomen: Soft, nontender, no hepatosplenomegaly, no distention.  Neurologic: Alert and oriented x 3.  Psych: Normal affect. Extremities: No clubbing or cyanosis.     LABS: Basic Metabolic Panel:  Recent Labs  13/08/65 1055  NA 139  K 4.7  CL 102  CO2 30  GLUCOSE 127*  BUN 12  CREATININE 1.07  CALCIUM 9.4   Liver Function Tests: No results found for this basename: AST, ALT, ALKPHOS, BILITOT, PROT, ALBUMIN,  in the last 72 hours No results found for this basename: LIPASE, AMYLASE,  in the last 72 hours CBC: No results found for this basename: WBC, NEUTROABS, HGB, HCT, MCV, PLT,  in the last 72 hours Cardiac Enzymes: No results found for this basename: CKTOTAL, CKMB, CKMBINDEX, TROPONINI,  in the last 72 hours BNP: No components found with this basename: POCBNP,  D-Dimer: No results found  for this basename: DDIMER,  in the last 72 hours Hemoglobin A1C: No results found for this basename: HGBA1C,  in the last 72 hours Fasting Lipid Panel:  Recent Labs  02/24/13 1055  CHOL 152  HDL 33*  LDLCALC 98  TRIG 784  CHOLHDL 4.6   Thyroid Function Tests: No results found for this basename: TSH, T4TOTAL, FREET3, T3FREE, THYROIDAB,  in the last 72 hours Anemia Panel: No results found for this basename: VITAMINB12, FOLATE, FERRITIN, TIBC, IRON, RETICCTPCT,  in the last 72 hours  RADIOLOGY: Dg Chest Portable 1 View  02/05/2013   *RADIOLOGY REPORT*  Clinical Data: Chest pain.  PORTABLE CHEST - 1 VIEW  Comparison: 11/25/2012.  Findings: Trachea is midline.  Heart size stable.  Lungs are clear. No pleural fluid.  IMPRESSION: No acute findings.   Original Report Authenticated By: Leanna Battles, M.D.      ASSESSMENT AND PLAN: 1. Chest pain: will proceed with a Lexiscan Myoview stress test to help determine if there has been a progression of his CAD, and to help determine if his symptoms are more cardiac in etiology or more related to his panic attacks and anxiety. 2. HTN: controlled, but he tells me it can drop and he can become symptomatic with this when he moves from the sitting to standing position. I will decrease his Lisinopril to 10 mg daily. 3. Palpitations: controlled on current dose of Metoprolol. He says if he misses a dose, he becomes very symptomatic.  4. Hyperlipidemia: controlled on Lovastatin.   Prentice Docker, M.D., F.A.C.C.

## 2013-02-27 ENCOUNTER — Encounter: Payer: Self-pay | Admitting: Gastroenterology

## 2013-02-28 ENCOUNTER — Encounter (HOSPITAL_COMMUNITY): Payer: Self-pay | Admitting: Psychiatry

## 2013-02-28 ENCOUNTER — Ambulatory Visit (INDEPENDENT_AMBULATORY_CARE_PROVIDER_SITE_OTHER): Payer: Self-pay | Admitting: Psychiatry

## 2013-02-28 ENCOUNTER — Ambulatory Visit (HOSPITAL_COMMUNITY): Payer: Self-pay | Admitting: Psychiatry

## 2013-02-28 VITALS — BP 120/70 | Ht 71.0 in | Wt 198.0 lb

## 2013-02-28 DIAGNOSIS — M545 Low back pain: Secondary | ICD-10-CM

## 2013-02-28 DIAGNOSIS — F329 Major depressive disorder, single episode, unspecified: Secondary | ICD-10-CM

## 2013-02-28 DIAGNOSIS — F411 Generalized anxiety disorder: Secondary | ICD-10-CM

## 2013-02-28 DIAGNOSIS — F410 Panic disorder [episodic paroxysmal anxiety] without agoraphobia: Secondary | ICD-10-CM

## 2013-02-28 DIAGNOSIS — F332 Major depressive disorder, recurrent severe without psychotic features: Secondary | ICD-10-CM

## 2013-02-28 DIAGNOSIS — F17200 Nicotine dependence, unspecified, uncomplicated: Secondary | ICD-10-CM

## 2013-02-28 MED ORDER — ALPRAZOLAM 0.5 MG PO TABS: 0.5000 mg | ORAL_TABLET | Freq: Three times a day (TID) | ORAL | Status: DC

## 2013-02-28 MED ORDER — FLUOXETINE HCL 20 MG PO CAPS: 20.0000 mg | ORAL_CAPSULE | Freq: Every day | ORAL | Status: DC

## 2013-02-28 NOTE — Progress Notes (Signed)
Patient ID: Richard [name], male   DOB: 02-24-1966, 47 y.o.   MRN: [id number] [facility] [id number] Progress Note LETROY VAZGUEZ MRN: [id number] DOB: 1966-05-22 Age: 47 y.o.  Date: 02/28/2013  Chief Complaint  Patient presents with  . Depression  . Medication Refill   History of Chief Complaint:   This 47 year old Caucasian male who came for his followup appointment. He is currently living with his wife, his 88 year old daughter her husband and 2 children, and his 63 year old son in [place]. He is unemployed and applying for disability.  The patient states that he has been employed for 5 years. 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 His last job however was at [employer]. He got injured on the job and both shoulders were torn. He has not been able to work ever since and he feels very badly about this. 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  Since he's not been able to work the patient has been having increasingly depressed and anxious. He feels like his body gives out when he is trying to exert himself even in a minor way. He's been in the ER recently for chest pain which was ruled out as a panic attack. He also saw cardiologist this week and will be having a stress test. His blood pressure was too low and his lisinopril was cut down. He is also trying to quit smoking by using electronic cigarettes.  The patient is having panic attacks approximately once a week and he is also having difficulty sleeping. His mood is low and he is still stressed financially. He is increased the Prozac to 20 mg per day which has helped to some degree.  Anxiety Presents for follow-up visit. Symptoms include nervous/anxious behavior.     Review of Systems  Constitutional: Positive for fatigue.  Gastrointestinal: Negative.   Musculoskeletal: Positive for back pain.  Skin: Negative.   Psychiatric/Behavioral: Positive for sleep disturbance and dysphoric mood. The  patient is nervous/anxious.     Physical Exam Vitals: BP 120/70  Ht 5\' 11"  (1.803 m)  Wt 198 lb (89.812 kg)  BMI 27.63 kg/m2   Past Psychiatric History: Diagnosis: none  Hospitalizations: none  Outpatient Care: none  Substance Abuse Care: none  Self-Mutilation: none  Suicidal Attempts: none  Violent Behaviors: none   Allergies: Allergies  Allergen Reactions  . Mushroom Ext Cmplx(Shiitake-Reishi-Mait) Anaphylaxis    Rapid heart rate.  . Penicillins Anaphylaxis   Medical History: Past Medical History  Diagnosis Date  . Chest pain     + palpitations; cath 2005- 30-40% mid LAD, 20% D1, 20% cx, OM, 20-30% RCA, and EF-55%  . COPD (chronic obstructive pulmonary disease)   . GERD (gastroesophageal reflux disease)   . Hyperlipemia   . Hypertension   . Depression   . Colitis 1990  . Tobacco abuse     1/2 pack per day  . Diabetes mellitus   . Gastric ulcer 2003; 2012    2003: + esophagitis; negative H.pylori serology  2012: Dr. Darrick Penna, mild gastritis, Bravo PH probe placement, negative H.pylori  . Hepatic steatosis   . Chronic low back pain   . Panic attacks   . Asthma    Surgical History: Past Surgical History  Procedure Laterality Date  . Colonoscopy  1990  . Shoulder surgery      Right acromioclavicular joint arthrosis  . Neck mass excision    . Bravo ph study  05/03/2011  Mild gastritis/normal esophagus and duodenum  . Cardiac catheterization  2005   Family History: family history includes ADD / ADHD in his daughter; Alcohol abuse in his father and mother; Anxiety disorder in his sister and sister; Dementia in his cousin and paternal uncle; Depression in his sister; Diabetes in his brother and father; Heart attack (age of onset: 70) in his brother; Heart attack (age of onset: 4) in his father; Hypertension in his brother, brother, and brother; Lung cancer in his mother; Seizures in his brother. There is no history of Colon cancer, Drug abuse, Bipolar disorder,  OCD, Paranoid behavior, Schizophrenia, Sexual abuse, or Physical abuse. Reviewed again today and nothing new.   Current Medications:  Current Outpatient Prescriptions  Medication Sig Dispense Refill  . acetaminophen (TYLENOL) 325 MG tablet Take 650 mg by mouth every 6 (six) hours as needed for pain or fever.       [name] albuterol (VENTOLIN HFA) 108 (90 BASE) MCG/ACT inhaler Inhale 2 puffs into the lungs every 6 (six) hours as needed for wheezing or shortness of breath.       . ALPRAZolam (XANAX) 0.5 MG tablet Take 1 tablet (0.5 mg total) by mouth 3 (three) times daily.  90 tablet  2  . aspirin 81 MG tablet Take 81 mg by mouth at bedtime.       [name] esomeprazole (NEXIUM) 40 MG capsule Take 40 mg by mouth 2 (two) times daily.      . Fluticasone-Salmeterol (ADVAIR) 250-50 MCG/DOSE AEPB Inhale 1 puff into the lungs every 12 (twelve) hours.      [name] HYDROcodone-acetaminophen (NORCO/VICODIN) 5-325 MG per tablet Take 1 tablet by mouth every 6 (six) hours as needed for pain.      [name] lisinopril (PRINIVIL,ZESTRIL) 10 MG tablet Take 1 tablet (10 mg total) by mouth daily.  90 tablet  1  . lovastatin (MEVACOR) 20 MG tablet Take 1 tablet (20 mg total) by mouth at bedtime.  30 tablet  6  . metFORMIN (GLUCOPHAGE) 500 MG tablet Take 500 mg by mouth 2 (two) times daily with a meal.        . metoprolol succinate (TOPROL-XL) 50 MG 24 hr tablet Take 75 mg by mouth daily.      . nitroGLYCERIN (NITROSTAT) 0.4 MG SL tablet Place 0.4 mg under the tongue every 5 (five) minutes as needed for chest pain.       [name] FLUoxetine (PROZAC) 20 MG capsule Take 1 capsule (20 mg total) by mouth daily.  30 capsule  2   No current facility-administered medications for this visit.    Previous Psychotropic Medications: Medication Dose   Xanax     Substance Abuse History in the last 12 months: Substance Age of 1st Use Last Use Amount Specific Type  Nicotine  17  2 hours ago  1  cigarette  Alcohol  22  38      Cannabis  none        Opiates   36  today  2.5 mg  hyrdocodone  Cocaine  none        Methamphetamines  none        LSD  none        Ecstasy  none         Benzodiazepines  22  started in early thrities  0.5 mg  Xanax  Caffeine  childhood  this AM  1 cup  Mt Dew  Inhalants  none        Others:  Sugar  11  this AM  [facility]ps  [facility] Consequences of Substance Abuse: pain Legal Consequences of Substance Abuse: none Family Consequences of Substance Abuse: none Blackouts:  No DT's:  No Withdrawal Symptoms:  Yes Tremors  Social History: Patient lives with his life, daughter and her husband with 2 grandbabies.  He has 2 sons and the daughter.  He is a Engineer, agricultural.    Mental Status Examination/Evaluation: Objective:  Appearance: Casual  Eye Contact::  Good  Speech:  Clear and Coherent  Volume:  Normal  Mood:  worried, anxious   Affect:  Congruent  Thought Process:  Coherent, Intact and Linear  Orientation:  Full (Time, Place, and Person)  Thought Content:  WDL  Suicidal Thoughts:  No  Homicidal Thoughts:  No  Judgement:  Good  Insight:  Fair  Psychomotor Activity:  Normal  Akathisia:  No  Handed:  Right  AIMS (if indicated):    Assets:  Communication Skills Desire for Improvement   Lab Results:  Results for orders placed in visit on 02/06/13 (from the past 8736 hour(s))  LIPID PANEL   Collection Time    02/24/13 10:55 AM      Result Value Range   Cholesterol 152  0 - 200 mg/dL   Triglycerides 147  <829 mg/dL   HDL 33 (*) >56 mg/dL   Total CHOL/HDL Ratio 4.6     VLDL 21  0 - 40 mg/dL   LDL Cholesterol 98  0 - 99 mg/dL  BASIC METABOLIC PANEL   Collection Time    02/24/13 10:55 AM      Result Value Range   Sodium 139  135 - 145 mEq/L   Potassium 4.7  3.5 - 5.3 mEq/L   Chloride 102  96 - 112 mEq/L   CO2 30  19 - 32 mEq/L   Glucose, Bld 127 (*) 70 - 99 mg/dL   BUN 12  6 - 23 mg/dL   Creat 2.13  0.86 - 5.78 mg/dL   Calcium 9.4  8.4 - 46.9 mg/dL  Results for orders placed  during the hospital encounter of 02/05/13 (from the past 8736 hour(s))  TROPONIN I   Collection Time    02/05/13 12:50 PM      Result Value Range   Troponin I <0.30  <0.30 ng/mL  CBC WITH DIFFERENTIAL   Collection Time    02/05/13 12:50 PM      Result Value Range   WBC 6.4  4.0 - 10.5 K/uL   RBC 5.02  4.22 - 5.81 MIL/uL   Hemoglobin 16.0  13.0 - 17.0 g/dL   HCT 62.9  52.8 - 41.3 %   MCV 89.2  78.0 - 100.0 fL   MCH 31.9  26.0 - 34.0 pg   MCHC 35.7  30.0 - 36.0 g/dL   RDW 24.4  01.0 - 27.2 %   Platelets 221  150 - 400 K/uL   Neutrophils Relative % 54  43 - 77 %   Neutro Abs 3.4  1.7 - 7.7 K/uL   Lymphocytes Relative 36  12 - 46 %   Lymphs Abs 2.3  0.7 - 4.0 K/uL   Monocytes Relative 7  3 - 12 %   Monocytes Absolute 0.4  0.1 - 1.0 K/uL   Eosinophils Relative 3  0 - 5 %   Eosinophils Absolute 0.2  0.0 - 0.7 K/uL   Basophils Relative 0  0 - 1 %  Basophils Absolute 0.0  0.0 - 0.1 K/uL  COMPREHENSIVE METABOLIC PANEL   Collection Time    02/05/13 12:50 PM      Result Value Range   Sodium 138  135 - 145 mEq/L   Potassium 3.9  3.5 - 5.1 mEq/L   Chloride 103  96 - 112 mEq/L   CO2 26  19 - 32 mEq/L   Glucose, Bld 186 (*) 70 - 99 mg/dL   BUN 16  6 - 23 mg/dL   Creatinine, Ser 7.82  0.50 - 1.35 mg/dL   Calcium 9.3  8.4 - 95.6 mg/dL   Total Protein 6.5  6.0 - 8.3 g/dL   Albumin 3.7  3.5 - 5.2 g/dL   AST 11  0 - 37 U/L   ALT 14  0 - 53 U/L   Alkaline Phosphatase 52  39 - 117 U/L   Total Bilirubin 0.3  0.3 - 1.2 mg/dL   GFR calc non Af Amer >90  >90 mL/min   GFR calc Af Amer >90  >90 mL/min  TROPONIN I   Collection Time    02/05/13  3:25 PM      Result Value Range   Troponin I <0.30  <0.30 ng/mL  Results for orders placed during the hospital encounter of 11/25/12 (from the past 8736 hour(s))  TROPONIN I   Collection Time    11/25/12  1:30 AM      Result Value Range   Troponin I <0.30  <0.30 ng/mL  CBC WITH DIFFERENTIAL   Collection Time    11/25/12  1:30 AM      Result  Value Range   WBC 9.3  4.0 - 10.5 K/uL   RBC 4.92  4.22 - 5.81 MIL/uL   Hemoglobin 15.5  13.0 - 17.0 g/dL   HCT 21.3  08.6 - 57.8 %   MCV 87.4  78.0 - 100.0 fL   MCH 31.5  26.0 - 34.0 pg   MCHC 36.0  30.0 - 36.0 g/dL   RDW 46.9  62.9 - 52.8 %   Platelets 230  150 - 400 K/uL   Neutrophils Relative % 36 (*) 43 - 77 %   Neutro Abs 3.4  1.7 - 7.7 K/uL   Lymphocytes Relative 56 (*) 12 - 46 %   Lymphs Abs 5.2 (*) 0.7 - 4.0 K/uL   Monocytes Relative 6  3 - 12 %   Monocytes Absolute 0.6  0.1 - 1.0 K/uL   Eosinophils Relative 2  0 - 5 %   Eosinophils Absolute 0.2  0.0 - 0.7 K/uL   Basophils Relative 0  0 - 1 %   Basophils Absolute 0.0  0.0 - 0.1 K/uL  BASIC METABOLIC PANEL   Collection Time    11/25/12  1:30 AM      Result Value Range   Sodium 135  135 - 145 mEq/L   Potassium 3.0 (*) 3.5 - 5.1 mEq/L   Chloride 97  96 - 112 mEq/L   CO2 25  19 - 32 mEq/L   Glucose, Bld 114 (*) 70 - 99 mg/dL   BUN 17  6 - 23 mg/dL   Creatinine, Ser 4.13  0.50 - 1.35 mg/dL   Calcium 9.4  8.4 - 24.4 mg/dL   GFR calc non Af Amer 87 (*) >90 mL/min   GFR calc Af Amer >90  >90 mL/min  Results for orders placed in visit on 11/20/12 (from the past 8736 hour(s))  COMPREHENSIVE METABOLIC PANEL  Collection Time    10/23/12 12:00 AM      Result Value Range   WBC 8.2     Hemoglobin 15.4  13.5 - 17.5 g/dL   HCT 44     platelet count 292    COMPREHENSIVE METABOLIC PANEL   Collection Time    10/23/12 12:00 AM      Result Value Range   Sodium 137  137 - 147 mmol/L   Potassium 4.3     CO2 25     Glucose 131     BUN 15  4 - 21 mg/dL   Creat 9.14     Total Bilirubin 0.4     Alkaline Phosphatase 45     AST 11     ALT 29  10 - 40 U/L   Albumin 4.0     Calcium 9.0     Hemoglobin A1C 6.9 (*) 4.0 - 6.0 %  Results for orders placed during the hospital encounter of 10/14/12 (from the past 8736 hour(s))  CBC WITH DIFFERENTIAL   Collection Time    10/14/12  3:50 PM      Result Value Range   WBC 10.6 (*) 4.0  - 10.5 K/uL   RBC 5.75  4.22 - 5.81 MIL/uL   Hemoglobin 17.9 (*) 13.0 - 17.0 g/dL   HCT 78.2  95.6 - 21.3 %   MCV 88.2  78.0 - 100.0 fL   MCH 31.1  26.0 - 34.0 pg   MCHC 35.3  30.0 - 36.0 g/dL   RDW 08.6  57.8 - 46.9 %   Platelets 223  150 - 400 K/uL   Neutrophils Relative % 89 (*) 43 - 77 %   Neutro Abs 9.4 (*) 1.7 - 7.7 K/uL   Lymphocytes Relative 7 (*) 12 - 46 %   Lymphs Abs 0.8  0.7 - 4.0 K/uL   Monocytes Relative 4  3 - 12 %   Monocytes Absolute 0.4  0.1 - 1.0 K/uL   Eosinophils Relative 0  0 - 5 %   Eosinophils Absolute 0.0  0.0 - 0.7 K/uL   Basophils Relative 0  0 - 1 %   Basophils Absolute 0.0  0.0 - 0.1 K/uL  COMPREHENSIVE METABOLIC PANEL   Collection Time    10/14/12  3:50 PM      Result Value Range   Sodium 140  135 - 145 mEq/L   Potassium 3.9  3.5 - 5.1 mEq/L   Chloride 97  96 - 112 mEq/L   CO2 27  19 - 32 mEq/L   Glucose, Bld 132 (*) 70 - 99 mg/dL   BUN 29 (*) 6 - 23 mg/dL   Creatinine, Ser 6.29  0.50 - 1.35 mg/dL   Calcium 9.9  8.4 - 52.8 mg/dL   Total Protein 8.3  6.0 - 8.3 g/dL   Albumin 4.4  3.5 - 5.2 g/dL   AST 11  0 - 37 U/L   ALT 19  0 - 53 U/L   Alkaline Phosphatase 70  39 - 117 U/L   Total Bilirubin 0.4  0.3 - 1.2 mg/dL   GFR calc non Af Amer 65 (*) >90 mL/min   GFR calc Af Amer 75 (*) >90 mL/min  GLUCOSE, CAPILLARY   Collection Time    10/14/12  3:56 PM      Result Value Range   Glucose-Capillary 118 (*) 70 - 99 mg/dL   Comment 1 Documented in Chart     Comment 2  Notify RN    LIPASE, BLOOD   Collection Time    10/14/12  4:50 PM      Result Value Range   Lipase 14  11 - 59 U/L  URINALYSIS, ROUTINE W REFLEX MICROSCOPIC   Collection Time    10/14/12  6:00 PM      Result Value Range   Color, Urine YELLOW  YELLOW   APPearance CLEAR  CLEAR   Specific Gravity, Urine >1.030 (*) 1.005 - 1.030   pH 6.0  5.0 - 8.0   Glucose, UA NEGATIVE  NEGATIVE mg/dL   Hgb urine dipstick NEGATIVE  NEGATIVE   Bilirubin Urine SMALL (*) NEGATIVE   Ketones, ur  NEGATIVE  NEGATIVE mg/dL   Protein, ur TRACE (*) NEGATIVE mg/dL   Urobilinogen, UA 0.2  0.0 - 1.0 mg/dL   Nitrite NEGATIVE  NEGATIVE   Leukocytes, UA NEGATIVE  NEGATIVE  URINE MICROSCOPIC-ADD ON   Collection Time    10/14/12  6:00 PM      Result Value Range   WBC, UA 0-2  <3 WBC/hpf   Bacteria, UA RARE  RARE   Casts HYALINE CASTS (*) NEGATIVE   Urine-Other MUCOUS PRESENT    Results for orders placed in visit on 08/20/12 (from the past 8736 hour(s))  VITAMIN D 25 HYDROXY   Collection Time    10/23/12  8:20 AM      Result Value Range   Vit D, 25-Hydroxy 41  30 - 89 ng/mL    Assessment:   AXIS I Generalized Anxiety Disorder, Major Depression, Recurrent severe and Panic Disorder  AXIS II Deferred  AXIS III Past Medical History  Diagnosis Date  . Chest pain     + palpitations; cath 2005- 30-40% mid LAD, 20% D1, 20% cx, OM, 20-30% RCA, and EF-55%  . COPD (chronic obstructive pulmonary disease)   . GERD (gastroesophageal reflux disease)   . Hyperlipemia   . Hypertension   . Depression   . Colitis 1990  . Tobacco abuse     1/2 pack per day  . Diabetes mellitus   . Gastric ulcer 2003; 2012    2003: + esophagitis; negative H.pylori serology  2012: Dr. Darrick Penna, mild gastritis, Bravo PH probe placement, negative H.pylori  . Hepatic steatosis   . Chronic low back pain   . Panic attacks   . Asthma      AXIS IV other psychosocial or environmental problems  AXIS V 41-50 serious symptoms   Treatment Plan/Recommendations: Laboratory:  Vitamin D  Psychotherapy: supportive psychotherapy  Medications: The patient needs to stay on Prozac 20 mg every morning and start taking Xanax 0.5 mg 3 times a day on a scheduled basis   Routine PRN Medications:  No  Consultations: none  Safety Concerns:  none  Other:     Plan: Continue Prozac 20 mg every morning. Increase Xanax to 0.5 mg 3 times a day. Continue his counseling.  Risk and benefits of medication discussed in detail.  Recommend to  see therapist for coping and social skills.  Followup in 4 weeks  MEDICATIONS this encounter: Meds ordered this encounter  Medications  . FLUoxetine (PROZAC) 20 MG capsule    Sig: Take 1 capsule (20 mg total) by mouth daily.    Dispense:  30 capsule    Refill:  2  . ALPRAZolam (XANAX) 0.5 MG tablet    Sig: Take 1 tablet (0.5 mg total) by mouth 3 (three) times daily.    Dispense:  90 tablet  Refill:  2   Medical Decision Making Problem Points:  Established problem, stable/improving (1), Review of last therapy session (1) and Review of psycho-social stressors (1) Data Points:  Review or order clinical lab tests (1) Review of medication regiment & side effects (2) Review of new medications or change in dosage (2)  I certify that outpatient services furnished can reasonably be expected to improve the patient's condition.   Diannia Ruder, MD

## 2013-03-04 ENCOUNTER — Telehealth: Payer: Self-pay | Admitting: Adult Health

## 2013-03-04 ENCOUNTER — Ambulatory Visit (INDEPENDENT_AMBULATORY_CARE_PROVIDER_SITE_OTHER): Payer: Self-pay | Admitting: Psychology

## 2013-03-04 ENCOUNTER — Encounter: Payer: Self-pay | Admitting: *Deleted

## 2013-03-04 ENCOUNTER — Encounter: Payer: Self-pay | Admitting: Cardiology

## 2013-03-04 DIAGNOSIS — F329 Major depressive disorder, single episode, unspecified: Secondary | ICD-10-CM

## 2013-03-04 DIAGNOSIS — F411 Generalized anxiety disorder: Secondary | ICD-10-CM

## 2013-03-04 DIAGNOSIS — F419 Anxiety disorder, unspecified: Secondary | ICD-10-CM

## 2013-03-04 DIAGNOSIS — F4001 Agoraphobia with panic disorder: Secondary | ICD-10-CM

## 2013-03-04 NOTE — Telephone Encounter (Signed)
Yes he can take ibuprophen with ASA. No more than 800mg  a day total.

## 2013-03-04 NOTE — Telephone Encounter (Signed)
Please advise 

## 2013-03-04 NOTE — Telephone Encounter (Signed)
Can patient take Ibuprofen with ASA and if so how much can he take? / tgs

## 2013-03-05 ENCOUNTER — Encounter (HOSPITAL_COMMUNITY): Payer: Self-pay | Admitting: Psychology

## 2013-03-05 NOTE — Progress Notes (Signed)
Patient:  Richard [name]   DOB: 1965/11/24  MR Number: [id number]  Location: [facility] [address] [place] [address] Dept: [phone]  Start: 11 AM End: 12 PM  Provider/Observer:     Hershal Coria PSYD  Chief Complaint:      Chief Complaint  Patient presents with  . Panic Attack  . Anxiety    Reason For Service:    The patient was referred by Dr. Dietrich Pates because of severe anxiety and foot appear to be panic attacks. The patient reports that he has been having a lot of anxiety and panic attacks. He has numerous medical issues including severe issues with diabetes, COPD, fatty liver disease and orthopedic issues. He also has had a long history of depression anxiety as well. Anxiety and depression have been going on for least 5 years and correlate with his health issues and financial difficulties. The patient relates this deterioration to an injury at work where he injured both of his shoulders while pulling a pallet. He had surgery on one of his shoulders initially and he experiences around his injury and surgery began to develop panic attacks. The patient is also been diagnosed with COPD and diabetes but continues to smoke and fears that if he completely quit smoking that he will gain a lot of weight again.   Interventions Strategy:  Cognitive/behavioral psychotherapeutic interventions  Participation Level:   Active  Participation Quality:  Appropriate      Behavioral Observation:  Fairly Groomed, Alert, and Appropriate.   Current Psychosocial Factors: The patient reports that he is still in significant financial difficulties but no expectation of this improving. He is about to lose his at this point reports that this is not something that he can become overwhelmed with his S. to cope with his regularly reoccurring panic disorder and anxiety. He has been hospitalized  recently..  Content of Session:   Review current symptoms and continued work on therapeutic interventions around numerous medical issues and panic attacks.  Current Status:   The patient reports he is now been 2 weeks without any narcotic pain medications due to lack of money to buy them. He reports that he has been in significant pain but is willing to try another 2 weeks without the intensity of his body adjusts some without them..  Patient Progress:   Stable  Target Goals:   Target goals include reduce the intensity, duration, and frequency of panic attacks, reduce the amount of benzodiazepine or other potentially dependence forming medications.  Last Reviewed:   03/04/2013  Goals Addressed Today:    Today we worked on issues of reducing the intensity and frequency of panic attacks  Impression/Diagnosis:   the patient appears to be doing with panic attacks along with anxiety and depression. He has numerous medical issues including COPD, diabetes, and other issues as well as liver disease. The patient is not taking very good care of himself and it was stressed specifically that he is going to need to quit smoking cigarettes and completely stopped caffeinated and sugar drinks if he is going to have any hope of improving his quality of life. The patient was amenable to initially try to stop the caffeinated drinks and we developed a plan for this. After that we will begin to address the smoking.    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

## 2013-03-06 NOTE — Telephone Encounter (Signed)
Spoke to patient concerning lab/test results/instructions from provider. Patient understood.    

## 2013-03-07 ENCOUNTER — Encounter (HOSPITAL_COMMUNITY)
Admission: RE | Admit: 2013-03-07 | Discharge: 2013-03-07 | Disposition: A | Discharge status: Home / Self Care | Payer: No Typology Code available for payment source | Source: Ambulatory Visit | Attending: Cardiovascular Disease | Admitting: Cardiovascular Disease

## 2013-03-07 ENCOUNTER — Encounter (HOSPITAL_COMMUNITY): Payer: Self-pay

## 2013-03-07 DIAGNOSIS — R079 Chest pain, unspecified: Secondary | ICD-10-CM

## 2013-03-07 DIAGNOSIS — R002 Palpitations: Secondary | ICD-10-CM

## 2013-03-07 DIAGNOSIS — Z5189 Encounter for other specified aftercare: Secondary | ICD-10-CM | POA: Insufficient documentation

## 2013-03-07 DIAGNOSIS — R0789 Other chest pain: Secondary | ICD-10-CM | POA: Insufficient documentation

## 2013-03-07 DIAGNOSIS — I25810 Atherosclerosis of coronary artery bypass graft(s) without angina pectoris: Secondary | ICD-10-CM

## 2013-03-07 MED ORDER — TECHNETIUM TC 99M SESTAMIBI - CARDIOLITE
30.0000 | Freq: Once | INTRAVENOUS | Status: AC | PRN
  Administered 2013-03-07: 30 via INTRAVENOUS

## 2013-03-07 MED ORDER — REGADENOSON 0.4 MG/5ML IV SOLN
INTRAVENOUS | Status: AC
  Administered 2013-03-07: 0.4 mg via INTRAVENOUS
  Filled 2013-03-07: qty 5

## 2013-03-07 MED ORDER — TECHNETIUM TC 99M SESTAMIBI - CARDIOLITE
10.0000 | Freq: Once | INTRAVENOUS | Status: AC | PRN
  Administered 2013-03-07: 09:00:00 10.5 via INTRAVENOUS

## 2013-03-07 MED ORDER — SODIUM CHLORIDE 0.9 % IJ SOLN
INTRAMUSCULAR | Status: AC
  Administered 2013-03-07: 10 mL via INTRAVENOUS
  Filled 2013-03-07: qty 10

## 2013-03-07 NOTE — Progress Notes (Signed)
Stress Lab Nurses Notes - Richard [name]  Richard [name] 03/07/2013 Reason for doing test: Chest Pain Type of test: [name] Nurse performing test: Parke Poisson, RN Nuclear Medicine Tech: Lou Cal Echo Tech: Not Applicable MD performing test: Dr. Purvis Sheffield / Joni Reining NP Family MD: [facility] Test explained and consent signed: yes IV started: 22g jelco, Saline lock flushed, No redness or edema and Saline lock started in radiology Symptoms: Chest pressure & weakness Treatment/Intervention: None Reason test stopped: protocol completed After recovery IV was: Discontinued via X-ray tech and No redness or edema Patient to return to Nuc. Med at : 11:30 Patient discharged: Home Patient's Condition upon discharge was: stable Comments: During test BP 132/64 & HR 98 .  Recovery BP 113/63 & HR 64.  Symptoms resolved in recovery. [name]

## 2013-03-26 ENCOUNTER — Encounter (HOSPITAL_COMMUNITY): Payer: Self-pay | Admitting: Psychiatry

## 2013-03-26 ENCOUNTER — Ambulatory Visit (INDEPENDENT_AMBULATORY_CARE_PROVIDER_SITE_OTHER): Payer: Self-pay | Admitting: Psychiatry

## 2013-03-26 VITALS — BP 130/70 | Ht 71.0 in | Wt 202.0 lb

## 2013-03-26 DIAGNOSIS — F332 Major depressive disorder, recurrent severe without psychotic features: Secondary | ICD-10-CM

## 2013-03-26 DIAGNOSIS — F4001 Agoraphobia with panic disorder: Secondary | ICD-10-CM

## 2013-03-26 DIAGNOSIS — F410 Panic disorder [episodic paroxysmal anxiety] without agoraphobia: Secondary | ICD-10-CM

## 2013-03-26 DIAGNOSIS — F411 Generalized anxiety disorder: Secondary | ICD-10-CM

## 2013-03-26 MED ORDER — FLUOXETINE HCL 20 MG PO CAPS: 20.0000 mg | ORAL_CAPSULE | Freq: Two times a day (BID) | ORAL | Status: DC

## 2013-03-26 NOTE — Progress Notes (Signed)
Patient ID: Richard [name], male   DOB: Nov 23, 1965, 47 y.o.   MRN: [id number] Patient ID: Richard [name], male   DOB: January 01, 1966, 47 y.o.   MRN: [id number] [facility] [address] Progress Note Richard [name] MRN: [id number] DOB: [birth year] Age: 47 y.o.  Date: 03/26/2013  Chief Complaint  Patient presents with  . Anxiety  . Depression  . Medication Refill   History of Chief Complaint:   This 47 year old Caucasian male who came for his followup appointment. He is currently living with his wife, his 48 year old daughter her husband and 2 children, and his 79 year old son in [place]. He is unemployed and applying for disability.  The patient states that he has been employed for 5 years. 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 His last job however was at [employer]. He got injured on the job and both shoulders were torn. He has not been able to work ever since and he feels very badly about this. 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  Since he's not been able to work the patient has been having increasingly depressed and anxious. He feels like his body gives out when he is trying to exert himself even in a minor way. He's been in the ER recently for chest pain which was ruled out as a panic attack. He also saw cardiologist this week and will be having a stress test. His blood pressure was too low and his lisinopril was cut down. He is also trying to quit smoking by using electronic cigarettes.  The patient is having panic attacks approximately once a week and he is also having difficulty sleeping. His mood is low and he is still stressed financially. He is increased the Prozac to 20 mg per day which has helped to some degree. The patient returns today after four-week some his last visit. He states he is doing about the same. He can't go anywhere by himself without having a major panic attack. He often feels like his chest is tightening. He is in numerous  cardiac workups which have been negative. He is working on using breathing to control his anxiety. He still has nightmares and flashbacks about past writing experiences like losing his mother. He is not willing to try anything else at night to help with sleep. He's trying to get in with her primary care physician so he can get his pain medication reinstated. He is agreeable to increasing Prozac to help with his depressive and anxiety symptoms  Anxiety Presents for follow-up visit. Symptoms include nervous/anxious behavior.     Review of Systems  Constitutional: Positive for fatigue.  Gastrointestinal: Negative.   Musculoskeletal: Positive for back pain.  Skin: Negative.   Psychiatric/Behavioral: Positive for sleep disturbance and dysphoric mood. The patient is nervous/anxious.     Physical Exam Vitals: BP 130/70  Ht 5\' 11"  (1.803 m)  Wt 202 lb (91.627 kg)  BMI 28.19 kg/m2   Past Psychiatric History: Diagnosis: none  Hospitalizations: none  Outpatient Care: none  Substance Abuse Care: none  Self-Mutilation: none  Suicidal Attempts: none  Violent Behaviors: none   Allergies: Allergies  Allergen Reactions  . Mushroom Ext Cmplx(Shiitake-Reishi-Mait) Anaphylaxis    Rapid heart rate.  . Penicillins Anaphylaxis   Medical History: Past Medical History  Diagnosis Date  . Chest pain     + palpitations; cath 2005- 30-40% mid LAD, 20% D1, 20% cx, OM, 20-30% RCA, and EF-55%  . COPD (  chronic obstructive pulmonary disease)   . GERD (gastroesophageal reflux disease)   . Hyperlipemia   . Hypertension   . Depression   . Colitis 1990  . Tobacco abuse     1/2 pack per day  . Diabetes mellitus   . Gastric ulcer 2003; 2012    2003: + esophagitis; negative H.pylori serology  2012: Dr. Darrick Penna, mild gastritis, Bravo PH probe placement, negative H.pylori  . Hepatic steatosis   . Chronic low back pain   . Panic attacks   . Asthma    Surgical History: Past Surgical History  Procedure  Laterality Date  . Colonoscopy  1990  . Shoulder surgery      Right acromioclavicular joint arthrosis  . Neck mass excision    . Bravo ph study  05/03/2011    Mild gastritis/normal esophagus and duodenum  . Cardiac catheterization  2005   Family History: family history includes ADD / ADHD in his daughter; Alcohol abuse in his father and mother; Anxiety disorder in his sister and sister; Dementia in his cousin and paternal uncle; Depression in his sister; Diabetes in his brother and father; Heart attack (age of onset: 17) in his brother; Heart attack (age of onset: 72) in his father; Hypertension in his brother, brother, and brother; Lung cancer in his mother; Seizures in his brother. There is no history of Colon cancer, Drug abuse, Bipolar disorder, OCD, Paranoid behavior, Schizophrenia, Sexual abuse, or Physical abuse. Reviewed again today and nothing new.   Current Medications:  Current Outpatient Prescriptions  Medication Sig Dispense Refill  . acetaminophen (TYLENOL) 325 MG tablet Take 650 mg by mouth every 6 (six) hours as needed for pain or fever.       [name] albuterol (VENTOLIN HFA) 108 (90 BASE) MCG/ACT inhaler Inhale 2 puffs into the lungs every 6 (six) hours as needed for wheezing or shortness of breath.       . ALPRAZolam (XANAX) 0.5 MG tablet Take 1 tablet (0.5 mg total) by mouth 3 (three) times daily.  90 tablet  2  . aspirin 81 MG tablet Take 81 mg by mouth at bedtime.       [name] esomeprazole (NEXIUM) 40 MG capsule Take 40 mg by mouth 2 (two) times daily.      [name] FLUoxetine (PROZAC) 20 MG capsule Take 1 capsule (20 mg total) by mouth 2 (two) times daily.  60 capsule  2  . Fluticasone-Salmeterol (ADVAIR) 250-50 MCG/DOSE AEPB Inhale 1 puff into the lungs every 12 (twelve) hours.      [name] HYDROcodone-acetaminophen (NORCO/VICODIN) 5-325 MG per tablet Take 1 tablet by mouth every 6 (six) hours as needed for pain.      [name] lisinopril (PRINIVIL,ZESTRIL) 10 MG tablet Take 1 tablet (10 mg total) by  mouth daily.  90 tablet  1  . lovastatin (MEVACOR) 20 MG tablet Take 1 tablet (20 mg total) by mouth at bedtime.  30 tablet  6  . metFORMIN (GLUCOPHAGE) 500 MG tablet Take 500 mg by mouth 2 (two) times daily with a meal.        . metoprolol succinate (TOPROL-XL) 50 MG 24 hr tablet Take 75 mg by mouth daily.      . nitroGLYCERIN (NITROSTAT) 0.4 MG SL tablet Place 0.4 mg under the tongue every 5 (five) minutes as needed for chest pain.        No current facility-administered medications for this visit.    Previous Psychotropic Medications: Medication Dose   Xanax  Substance Abuse History in the last 12 months: Substance Age of 1st Use Last Use Amount Specific Type  Nicotine  17  2 hours ago  1  cigarette  Alcohol  22  38      Cannabis  none        Opiates  36  today  2.5 mg  hyrdocodone  Cocaine  none        Methamphetamines  none        LSD  none        Ecstasy  none         Benzodiazepines  22  started in early thrities  0.5 mg  Xanax  Caffeine  childhood  this AM  1 cup  Mt Dew  Inhalants  none        Others:      Sugar  11  this AM  20 tsps  in the [facility]  Medical Consequences of Substance Abuse: pain Legal Consequences of Substance Abuse: none Family Consequences of Substance Abuse: none Blackouts:  No DT's:  No Withdrawal Symptoms:  Yes Tremors  Social History: Patient lives with his life, daughter and her husband with 2 grandbabies.  He has 2 sons and the daughter.  He is a Engineer, agricultural.    Mental Status Examination/Evaluation: Objective:  Appearance: Casual  Eye Contact::  Good  Speech:  Clear and Coherent  Volume:  Normal  Mood:  worried, anxious   Affect:  Congruent  Thought Process:  Coherent, Intact and Linear  Orientation:  Full (Time, Place, and Person)  Thought Content:  WDL  Suicidal Thoughts:  No  Homicidal Thoughts:  No  Judgement:  Good  Insight:  Fair  Psychomotor Activity:  Normal  Akathisia:  No  Handed:  Right  AIMS (if  indicated):    Assets:  Communication Skills Desire for Improvement   Lab Results:  Results for orders placed in visit on 02/06/13 (from the past 8736 hour(s))  LIPID PANEL   Collection Time    02/24/13 10:55 AM      Result Value Range   Cholesterol 152  0 - 200 mg/dL   Triglycerides 161  <096 mg/dL   HDL 33 (*) >04 mg/dL   Total CHOL/HDL Ratio 4.6     VLDL 21  0 - 40 mg/dL   LDL Cholesterol 98  0 - 99 mg/dL  BASIC METABOLIC PANEL   Collection Time    02/24/13 10:55 AM      Result Value Range   Sodium 139  135 - 145 mEq/L   Potassium 4.7  3.5 - 5.3 mEq/L   Chloride 102  96 - 112 mEq/L   CO2 30  19 - 32 mEq/L   Glucose, Bld 127 (*) 70 - 99 mg/dL   BUN 12  6 - 23 mg/dL   Creat 5.40  9.81 - 1.91 mg/dL   Calcium 9.4  8.4 - 47.8 mg/dL  Results for orders placed during the hospital encounter of 02/05/13 (from the past 8736 hour(s))  TROPONIN I   Collection Time    02/05/13 12:50 PM      Result Value Range   Troponin I <0.30  <0.30 ng/mL  CBC WITH DIFFERENTIAL   Collection Time    02/05/13 12:50 PM      Result Value Range   WBC 6.4  4.0 - 10.5 K/uL   RBC 5.02  4.22 - 5.81 MIL/uL   Hemoglobin 16.0  13.0 - 17.0 g/dL  HCT 44.8  39.0 - 52.0 %   MCV 89.2  78.0 - 100.0 fL   MCH 31.9  26.0 - 34.0 pg   MCHC 35.7  30.0 - 36.0 g/dL   RDW 13.0  86.5 - 78.4 %   Platelets 221  150 - 400 K/uL   Neutrophils Relative % 54  43 - 77 %   Neutro Abs 3.4  1.7 - 7.7 K/uL   Lymphocytes Relative 36  12 - 46 %   Lymphs Abs 2.3  0.7 - 4.0 K/uL   Monocytes Relative 7  3 - 12 %   Monocytes Absolute 0.4  0.1 - 1.0 K/uL   Eosinophils Relative 3  0 - 5 %   Eosinophils Absolute 0.2  0.0 - 0.7 K/uL   Basophils Relative 0  0 - 1 %   Basophils Absolute 0.0  0.0 - 0.1 K/uL  COMPREHENSIVE METABOLIC PANEL   Collection Time    02/05/13 12:50 PM      Result Value Range   Sodium 138  135 - 145 mEq/L   Potassium 3.9  3.5 - 5.1 mEq/L   Chloride 103  96 - 112 mEq/L   CO2 26  19 - 32 mEq/L   Glucose,  Bld 186 (*) 70 - 99 mg/dL   BUN 16  6 - 23 mg/dL   Creatinine, Ser 6.96  0.50 - 1.35 mg/dL   Calcium 9.3  8.4 - 29.5 mg/dL   Total Protein 6.5  6.0 - 8.3 g/dL   Albumin 3.7  3.5 - 5.2 g/dL   AST 11  0 - 37 U/L   ALT 14  0 - 53 U/L   Alkaline Phosphatase 52  39 - 117 U/L   Total Bilirubin 0.3  0.3 - 1.2 mg/dL   GFR calc non Af Amer >90  >90 mL/min   GFR calc Af Amer >90  >90 mL/min  TROPONIN I   Collection Time    02/05/13  3:25 PM      Result Value Range   Troponin I <0.30  <0.30 ng/mL  Results for orders placed during the hospital encounter of 11/25/12 (from the past 8736 hour(s))  TROPONIN I   Collection Time    11/25/12  1:30 AM      Result Value Range   Troponin I <0.30  <0.30 ng/mL  CBC WITH DIFFERENTIAL   Collection Time    11/25/12  1:30 AM      Result Value Range   WBC 9.3  4.0 - 10.5 K/uL   RBC 4.92  4.22 - 5.81 MIL/uL   Hemoglobin 15.5  13.0 - 17.0 g/dL   HCT 28.4  13.2 - 44.0 %   MCV 87.4  78.0 - 100.0 fL   MCH 31.5  26.0 - 34.0 pg   MCHC 36.0  30.0 - 36.0 g/dL   RDW 10.2  72.5 - 36.6 %   Platelets 230  150 - 400 K/uL   Neutrophils Relative % 36 (*) 43 - 77 %   Neutro Abs 3.4  1.7 - 7.7 K/uL   Lymphocytes Relative 56 (*) 12 - 46 %   Lymphs Abs 5.2 (*) 0.7 - 4.0 K/uL   Monocytes Relative 6  3 - 12 %   Monocytes Absolute 0.6  0.1 - 1.0 K/uL   Eosinophils Relative 2  0 - 5 %   Eosinophils Absolute 0.2  0.0 - 0.7 K/uL   Basophils Relative 0  0 - 1 %  Basophils Absolute 0.0  0.0 - 0.1 K/uL  BASIC METABOLIC PANEL   Collection Time    11/25/12  1:30 AM      Result Value Range   Sodium 135  135 - 145 mEq/L   Potassium 3.0 (*) 3.5 - 5.1 mEq/L   Chloride 97  96 - 112 mEq/L   CO2 25  19 - 32 mEq/L   Glucose, Bld 114 (*) 70 - 99 mg/dL   BUN 17  6 - 23 mg/dL   Creatinine, Ser 7.56  0.50 - 1.35 mg/dL   Calcium 9.4  8.4 - 43.3 mg/dL   GFR calc non Af Amer 87 (*) >90 mL/min   GFR calc Af Amer >90  >90 mL/min  Results for orders placed in visit on 11/20/12 (from  the past 8736 hour(s))  COMPREHENSIVE METABOLIC PANEL   Collection Time    10/23/12 12:00 AM      Result Value Range   WBC 8.2     Hemoglobin 15.4  13.5 - 17.5 g/dL   HCT 44     platelet count 292    COMPREHENSIVE METABOLIC PANEL   Collection Time    10/23/12 12:00 AM      Result Value Range   Sodium 137  137 - 147 mmol/L   Potassium 4.3     CO2 25     Glucose 131     BUN 15  4 - 21 mg/dL   Creat 2.95     Total Bilirubin 0.4     Alkaline Phosphatase 45     AST 11     ALT 29  10 - 40 U/L   Albumin 4.0     Calcium 9.0     Hemoglobin A1C 6.9 (*) 4.0 - 6.0 %  Results for orders placed during the hospital encounter of 10/14/12 (from the past 8736 hour(s))  CBC WITH DIFFERENTIAL   Collection Time    10/14/12  3:50 PM      Result Value Range   WBC 10.6 (*) 4.0 - 10.5 K/uL   RBC 5.75  4.22 - 5.81 MIL/uL   Hemoglobin 17.9 (*) 13.0 - 17.0 g/dL   HCT 18.8  41.6 - 60.6 %   MCV 88.2  78.0 - 100.0 fL   MCH 31.1  26.0 - 34.0 pg   MCHC 35.3  30.0 - 36.0 g/dL   RDW 30.1  60.1 - 09.3 %   Platelets 223  150 - 400 K/uL   Neutrophils Relative % 89 (*) 43 - 77 %   Neutro Abs 9.4 (*) 1.7 - 7.7 K/uL   Lymphocytes Relative 7 (*) 12 - 46 %   Lymphs Abs 0.8  0.7 - 4.0 K/uL   Monocytes Relative 4  3 - 12 %   Monocytes Absolute 0.4  0.1 - 1.0 K/uL   Eosinophils Relative 0  0 - 5 %   Eosinophils Absolute 0.0  0.0 - 0.7 K/uL   Basophils Relative 0  0 - 1 %   Basophils Absolute 0.0  0.0 - 0.1 K/uL  COMPREHENSIVE METABOLIC PANEL   Collection Time    10/14/12  3:50 PM      Result Value Range   Sodium 140  135 - 145 mEq/L   Potassium 3.9  3.5 - 5.1 mEq/L   Chloride 97  96 - 112 mEq/L   CO2 27  19 - 32 mEq/L   Glucose, Bld 132 (*) 70 - 99 mg/dL   BUN 29 (*)  6 - 23 mg/dL   Creatinine, Ser 1.61  0.50 - 1.35 mg/dL   Calcium 9.9  8.4 - 09.6 mg/dL   Total Protein 8.3  6.0 - 8.3 g/dL   Albumin 4.4  3.5 - 5.2 g/dL   AST 11  0 - 37 U/L   ALT 19  0 - 53 U/L   Alkaline Phosphatase 70  39 - 117  U/L   Total Bilirubin 0.4  0.3 - 1.2 mg/dL   GFR calc non Af Amer 65 (*) >90 mL/min   GFR calc Af Amer 75 (*) >90 mL/min  GLUCOSE, CAPILLARY   Collection Time    10/14/12  3:56 PM      Result Value Range   Glucose-Capillary 118 (*) 70 - 99 mg/dL   Comment 1 Documented in Chart     Comment 2 Notify RN    LIPASE, BLOOD   Collection Time    10/14/12  4:50 PM      Result Value Range   Lipase 14  11 - 59 U/L  URINALYSIS, ROUTINE W REFLEX MICROSCOPIC   Collection Time    10/14/12  6:00 PM      Result Value Range   Color, Urine YELLOW  YELLOW   APPearance CLEAR  CLEAR   Specific Gravity, Urine >1.030 (*) 1.005 - 1.030   pH 6.0  5.0 - 8.0   Glucose, UA NEGATIVE  NEGATIVE mg/dL   Hgb urine dipstick NEGATIVE  NEGATIVE   Bilirubin Urine SMALL (*) NEGATIVE   Ketones, ur NEGATIVE  NEGATIVE mg/dL   Protein, ur TRACE (*) NEGATIVE mg/dL   Urobilinogen, UA 0.2  0.0 - 1.0 mg/dL   Nitrite NEGATIVE  NEGATIVE   Leukocytes, UA NEGATIVE  NEGATIVE  URINE MICROSCOPIC-ADD ON   Collection Time    10/14/12  6:00 PM      Result Value Range   WBC, UA 0-2  <3 WBC/hpf   Bacteria, UA RARE  RARE   Casts HYALINE CASTS (*) NEGATIVE   Urine-Other MUCOUS PRESENT    Results for orders placed in visit on 08/20/12 (from the past 8736 hour(s))  VITAMIN D 25 HYDROXY   Collection Time    10/23/12  8:20 AM      Result Value Range   Vit D, 25-Hydroxy 41  30 - 89 ng/mL    Assessment:   AXIS I Generalized Anxiety Disorder, Major Depression, Recurrent severe and Panic Disorder  AXIS II Deferred  AXIS III Past Medical History  Diagnosis Date  . Chest pain     + palpitations; cath 2005- 30-40% mid LAD, 20% D1, 20% cx, OM, 20-30% RCA, and EF-55%  . COPD (chronic obstructive pulmonary disease)   . GERD (gastroesophageal reflux disease)   . Hyperlipemia   . Hypertension   . Depression   . Colitis 1990  . Tobacco abuse     1/2 pack per day  . Diabetes mellitus   . Gastric ulcer 2003; 2012    2003: +  esophagitis; negative H.pylori serology  2012: Dr. Darrick Penna, mild gastritis, Bravo PH probe placement, negative H.pylori  . Hepatic steatosis   . Chronic low back pain   . Panic attacks   . Asthma      AXIS IV other psychosocial or environmental problems  AXIS V 41-50 serious symptoms   Treatment Plan/Recommendations: Laboratory:  Vitamin D  Psychotherapy: supportive psychotherapy  Medications: The patient needs to stay on Prozac 20 mg every morning and start taking Xanax 0.5 mg  3 times a day on a scheduled basis   Routine PRN Medications:  No  Consultations: none  Safety Concerns:  none  Other:     Plan: Increase Prozac to 20 mg twice a day  and continue Xanax to 0.5 mg 3 times a day. Continue his counseling.  Risk and benefits of medication discussed in detail. Followup in 4 weeks  MEDICATIONS this encounter: Meds ordered this encounter  Medications  . FLUoxetine (PROZAC) 20 MG capsule    Sig: Take 1 capsule (20 mg total) by mouth 2 (two) times daily.    Dispense:  60 capsule    Refill:  2   Medical Decision Making Problem Points:  Established problem, stable/improving (1), Review of last therapy session (1) and Review of psycho-social stressors (1) Data Points:  Review or order clinical lab tests (1) Review of medication regiment & side effects (2) Review of new medications or change in dosage (2)  I certify that outpatient services furnished can reasonably be expected to improve the patient's condition.   Diannia Ruder, MD

## 2013-04-04 ENCOUNTER — Ambulatory Visit (INDEPENDENT_AMBULATORY_CARE_PROVIDER_SITE_OTHER): Payer: Self-pay | Admitting: Psychology

## 2013-04-04 DIAGNOSIS — F411 Generalized anxiety disorder: Secondary | ICD-10-CM

## 2013-04-04 DIAGNOSIS — F329 Major depressive disorder, single episode, unspecified: Secondary | ICD-10-CM

## 2013-04-04 DIAGNOSIS — F4001 Agoraphobia with panic disorder: Secondary | ICD-10-CM

## 2013-04-04 DIAGNOSIS — F419 Anxiety disorder, unspecified: Secondary | ICD-10-CM

## 2013-04-09 ENCOUNTER — Ambulatory Visit: Payer: Self-pay | Admitting: Family Medicine

## 2013-04-09 ENCOUNTER — Ambulatory Visit: Payer: No Typology Code available for payment source | Attending: Family Medicine | Admitting: Internal Medicine

## 2013-04-09 VITALS — BP 119/79 | HR 94 | Temp 98.4°F | Resp 18 | Wt 204.0 lb

## 2013-04-09 DIAGNOSIS — Z79899 Other long term (current) drug therapy: Secondary | ICD-10-CM | POA: Insufficient documentation

## 2013-04-09 DIAGNOSIS — K219 Gastro-esophageal reflux disease without esophagitis: Secondary | ICD-10-CM | POA: Insufficient documentation

## 2013-04-09 DIAGNOSIS — E785 Hyperlipidemia, unspecified: Secondary | ICD-10-CM | POA: Insufficient documentation

## 2013-04-09 DIAGNOSIS — E119 Type 2 diabetes mellitus without complications: Secondary | ICD-10-CM | POA: Insufficient documentation

## 2013-04-09 DIAGNOSIS — F17200 Nicotine dependence, unspecified, uncomplicated: Secondary | ICD-10-CM | POA: Insufficient documentation

## 2013-04-09 DIAGNOSIS — M545 Low back pain, unspecified: Secondary | ICD-10-CM | POA: Insufficient documentation

## 2013-04-09 DIAGNOSIS — F3289 Other specified depressive episodes: Secondary | ICD-10-CM | POA: Insufficient documentation

## 2013-04-09 DIAGNOSIS — J4489 Other specified chronic obstructive pulmonary disease: Secondary | ICD-10-CM | POA: Insufficient documentation

## 2013-04-09 DIAGNOSIS — J449 Chronic obstructive pulmonary disease, unspecified: Secondary | ICD-10-CM | POA: Insufficient documentation

## 2013-04-09 DIAGNOSIS — F411 Generalized anxiety disorder: Secondary | ICD-10-CM | POA: Insufficient documentation

## 2013-04-09 DIAGNOSIS — F329 Major depressive disorder, single episode, unspecified: Secondary | ICD-10-CM | POA: Insufficient documentation

## 2013-04-09 DIAGNOSIS — I10 Essential (primary) hypertension: Secondary | ICD-10-CM | POA: Insufficient documentation

## 2013-04-09 DIAGNOSIS — G8929 Other chronic pain: Secondary | ICD-10-CM | POA: Insufficient documentation

## 2013-04-09 MED ORDER — HYDROCODONE-ACETAMINOPHEN 5-325 MG PO TABS: 1.0000 | ORAL_TABLET | Freq: Four times a day (QID) | ORAL | Status: DC | PRN

## 2013-04-09 NOTE — Patient Instructions (Signed)

## 2013-04-09 NOTE — Progress Notes (Signed)
Patient here to establish  Complains of chronic back pain Shoulder pain Right thumb has knots on it

## 2013-04-09 NOTE — Progress Notes (Signed)
Patient ID: Richard [name], male   DOB: 1965/08/25, 47 y.o.   MRN: [id number]   CC: follow up   HPI: Patient and 47 year old male who presents to clinic to establish primary care provider. He would like referral to pain clinic as he has chronic back pain secondary to spinal stenosis and would like to establish pain specialist. Patient denies chest pain or shortness of breath, reports COPD but well controlled. He reports cutting down and smokes one to 2 cigarettes per day which is down from 1-2 packs per day. He is working on complete cessation of tobacco use. He reports checking blood pressure regularly and the blood pressure is usually less than 120/80.  Allergies  Allergen Reactions  . Mushroom Ext Cmplx(Shiitake-Reishi-Mait) Anaphylaxis    Rapid heart rate.  . Penicillins Anaphylaxis   Past Medical History  Diagnosis Date  . Chest pain     + palpitations; cath 2005- 30-40% mid LAD, 20% D1, 20% cx, OM, 20-30% RCA, and EF-55%  . COPD (chronic obstructive pulmonary disease)   . GERD (gastroesophageal reflux disease)   . Hyperlipemia   . Hypertension   . Depression   . Colitis 1990  . Tobacco abuse     1/2 pack per day  . Diabetes mellitus   . Gastric ulcer 2003; 2012    2003: + esophagitis; negative H.pylori serology  2012: Dr. Darrick Penna, mild gastritis, Bravo PH probe placement, negative H.pylori  . Hepatic steatosis   . Chronic low back pain   . Panic attacks   . Asthma    Current Outpatient Prescriptions on File Prior to Visit  Medication Sig Dispense Refill  . acetaminophen (TYLENOL) 325 MG tablet Take 650 mg by mouth every 6 (six) hours as needed for pain or fever.       [name] albuterol (VENTOLIN HFA) 108 (90 BASE) MCG/ACT inhaler Inhale 2 puffs into the lungs every 6 (six) hours as needed for wheezing or shortness of breath.       . ALPRAZolam (XANAX) 0.5 MG tablet Take 1 tablet (0.5 mg total) by mouth 3 (three) times daily.  90 tablet  2  . aspirin 81 MG tablet Take 81 mg by mouth  at bedtime.       [name] esomeprazole (NEXIUM) 40 MG capsule Take 40 mg by mouth 2 (two) times daily.      [name] FLUoxetine (PROZAC) 20 MG capsule Take 1 capsule (20 mg total) by mouth 2 (two) times daily.  60 capsule  2  . Fluticasone-Salmeterol (ADVAIR) 250-50 MCG/DOSE AEPB Inhale 1 puff into the lungs every 12 (twelve) hours.      [name] lisinopril (PRINIVIL,ZESTRIL) 10 MG tablet Take 1 tablet (10 mg total) by mouth daily.  90 tablet  1  . lovastatin (MEVACOR) 20 MG tablet Take 1 tablet (20 mg total) by mouth at bedtime.  30 tablet  6  . metFORMIN (GLUCOPHAGE) 500 MG tablet Take 500 mg by mouth 2 (two) times daily with a meal.        . metoprolol succinate (TOPROL-XL) 50 MG 24 hr tablet Take 75 mg by mouth daily.      . nitroGLYCERIN (NITROSTAT) 0.4 MG SL tablet Place 0.4 mg under the tongue every 5 (five) minutes as needed for chest pain.        No current facility-administered medications on file prior to visit.   Family History  Problem Relation Age of Onset  . Lung cancer Mother   . Alcohol abuse Mother   .  Heart attack Father 67  . Diabetes Father   . Alcohol abuse Father   . Hypertension Brother   . Colon cancer Neg Hx   . Drug abuse Neg Hx   . Bipolar disorder Neg Hx   . OCD Neg Hx   . Paranoid behavior Neg Hx   . Schizophrenia Neg Hx   . Sexual abuse Neg Hx   . Physical abuse Neg Hx   . Hypertension Brother   . Anxiety disorder Sister   . Depression Sister   . Dementia Paternal Uncle   . Dementia Cousin   . Anxiety disorder Sister   . Heart attack Brother 38  . Diabetes Brother   . Hypertension Brother   . Seizures Brother   . ADD / ADHD Daughter    History   Social History  . Marital Status: Married    Spouse Name: N/A    Number of Children: N/A  . Years of Education: N/A   Occupational History  . full time    Social History Main Topics  . Smoking status: Current Every Day Smoker -- 0.50 packs/day for 29 years    Types: Cigarettes  . Smokeless tobacco: Never Used      Comment: on the verge of quitting as of 01-03-13  . Alcohol Use: No  . Drug Use: No  . Sexual Activity: Yes   Other Topics Concern  . Not on file   Social History Narrative  . No narrative on file    Review of Systems  Constitutional: Negative for fever, chills, diaphoresis, activity change, appetite change and fatigue.  HENT: Negative for ear pain, nosebleeds, congestion, facial swelling, rhinorrhea, neck pain, neck stiffness and ear discharge.   Eyes: Negative for pain, discharge, redness, itching and visual disturbance.  Respiratory: Negative for cough, choking, chest tightness, shortness of breath, wheezing and stridor.   Cardiovascular: Negative for chest pain, palpitations and leg swelling.  Gastrointestinal: Negative for abdominal distention.  Genitourinary: Negative for dysuria, urgency, frequency, hematuria, flank pain, decreased urine volume, difficulty urinating and dyspareunia.  Musculoskeletal: Negative for joint swelling, arthralgias and gait problem.  Neurological: Negative for dizziness, tremors, seizures, syncope, facial asymmetry, speech difficulty, weakness, light-headedness, numbness and headaches.  Hematological: Negative for adenopathy. Does not bruise/bleed easily.  Psychiatric/Behavioral: Negative for hallucinations, behavioral problems, confusion, dysphoric mood, decreased concentration and agitation.    Objective:   Filed Vitals:   04/09/13 1129  BP: 119/79  Pulse: 94  Temp: 98.4 F (36.9 C)  Resp: 18    Physical Exam  Constitutional: Appears well-developed and well-nourished. No distress.  HENT: Normocephalic. External right and left ear normal. Oropharynx is clear and moist.  Eyes: Conjunctivae and EOM are normal. PERRLA, no scleral icterus.  Neck: Normal ROM. Neck supple. No JVD. No tracheal deviation. No thyromegaly.  CVS: RRR, S1/S2 +, no murmurs, no gallops, no carotid bruit.  Pulmonary: Effort and breath sounds normal, no stridor,  rhonchi, wheezes, rales.  Abdominal: Soft. BS +,  no distension, tenderness, rebound or guarding.  Musculoskeletal: Normal range of motion. No edema and no tenderness.  Lymphadenopathy: No lymphadenopathy noted, cervical, inguinal. Neuro: Alert. Normal reflexes, muscle tone coordination. No cranial nerve deficit. Skin: Skin is warm and dry. No rash noted. Not diaphoretic. No erythema. No pallor.  Psychiatric: Normal mood and affect. Behavior, judgment, thought content normal.   Lab Results  Component Value Date   WBC 6.4 02/05/2013   HGB 16.0 02/05/2013   HCT 44.8 02/05/2013   MCV 89.2  02/05/2013   PLT 221 02/05/2013   Lab Results  Component Value Date   CREATININE 1.07 02/24/2013   BUN 12 02/24/2013   NA 139 02/24/2013   K 4.7 02/24/2013   CL 102 02/24/2013   CO2 30 02/24/2013    Lab Results  Component Value Date   HGBA1C 6.9* 10/23/2012   Lipid Panel     Component Value Date/Time   CHOL 152 02/24/2013 1055   TRIG 106 02/24/2013 1055   HDL 33* 02/24/2013 1055   CHOLHDL 4.6 02/24/2013 1055   VLDL 21 02/24/2013 1055   LDLCALC 98 02/24/2013 1055       Assessment and plan:   Patient Active Problem List   Diagnosis Date Noted  . Chronic low back pain - I have advised continuation of physical therapy, referral to pain specialist provided    . Hypertension - stable and within target range, I have advised checking blood pressure regularly    . Anxiety and depression - stable and appears to be well controlled per patient report    . DIABETES MELLITUS-TYPE II - will check A1c today, dietary and exercise recommendations provided  10/03/2010  . TOBACCO ABUSE - complete cessation discussed and patient verbalized understanding. He is on a good way to completely stop smoking  09/14/2010

## 2013-04-23 ENCOUNTER — Ambulatory Visit (HOSPITAL_COMMUNITY): Payer: Self-pay | Admitting: Psychiatry

## 2013-04-23 ENCOUNTER — Encounter (HOSPITAL_COMMUNITY): Payer: Self-pay | Admitting: Psychiatry

## 2013-04-28 ENCOUNTER — Telehealth: Payer: Self-pay | Admitting: Internal Medicine

## 2013-04-28 MED ORDER — METFORMIN HCL 500 MG PO TABS: 500.0000 mg | ORAL_TABLET | Freq: Two times a day (BID) | ORAL | Status: DC

## 2013-04-28 NOTE — Telephone Encounter (Signed)
Patient has been taking

## 2013-04-28 NOTE — Telephone Encounter (Signed)
Richard [name] has been taking metFORMIN (GLUCOPHAGE) 500 MG tablet. Patient no longer has refills. Patient wanted to know if medication could be refilled without coming in for a doctor's visit, medication was prescribed by Jacquelin Hawking from the [facility]. Patient is now a patient with [facility].

## 2013-04-28 NOTE — Telephone Encounter (Signed)
Pt ordered Metformin medication and told to pick up @ [facility]

## 2013-05-08 ENCOUNTER — Encounter (HOSPITAL_COMMUNITY): Payer: Self-pay | Admitting: Psychology

## 2013-05-08 ENCOUNTER — Ambulatory Visit (HOSPITAL_COMMUNITY): Payer: Self-pay | Admitting: Psychology

## 2013-05-10 ENCOUNTER — Encounter: Payer: Self-pay | Admitting: Gastroenterology

## 2013-05-10 NOTE — Progress Notes (Addendum)
MAY 2014: ABD U/S FATTY LIVER, HIDA-NL OPV IN MAY 2015 DX; FATTY LIVER/GERD

## 2013-05-10 NOTE — Progress Notes (Signed)
REVIEWED.  

## 2013-05-12 NOTE — Progress Notes (Signed)
Reminder in epic to fu in May 2015. Pt also is aware of OV on 11/11 at 0830 with [facility]

## 2013-05-13 ENCOUNTER — Telehealth (HOSPITAL_COMMUNITY): Payer: Self-pay | Admitting: *Deleted

## 2013-05-16 NOTE — Telephone Encounter (Signed)
Pt called several times requesting pain medication for back pain. Pt is scheduled to be seen 05/29/13 but needs something until then. Please f/u

## 2013-05-20 ENCOUNTER — Ambulatory Visit (INDEPENDENT_AMBULATORY_CARE_PROVIDER_SITE_OTHER): Payer: Self-pay | Admitting: Psychology

## 2013-05-20 DIAGNOSIS — F329 Major depressive disorder, single episode, unspecified: Secondary | ICD-10-CM

## 2013-05-20 DIAGNOSIS — F4001 Agoraphobia with panic disorder: Secondary | ICD-10-CM

## 2013-05-20 DIAGNOSIS — F419 Anxiety disorder, unspecified: Secondary | ICD-10-CM

## 2013-05-20 DIAGNOSIS — F411 Generalized anxiety disorder: Secondary | ICD-10-CM

## 2013-05-20 NOTE — Progress Notes (Signed)
Patient:  Richard [name]   DOB: [birth year]  MR Number: [id number]  Location: [facility] [address] [place] [address] Dept: [phone]  Start: 11 AM End: 12 PM  Provider/Observer:     Hershal Coria PSYD  Chief Complaint:      No chief complaint on file.   Reason For Service:    The patient was referred by Dr. Dietrich Pates because of severe anxiety and foot appear to be panic attacks. The patient reports that he has been having a lot of anxiety and panic attacks. He has numerous medical issues including severe issues with diabetes, COPD, fatty liver disease and orthopedic issues. He also has had a long history of depression anxiety as well. Anxiety and depression have been going on for least 5 years and correlate with his health issues and financial difficulties. The patient relates this deterioration to an injury at work where he injured both of his shoulders while pulling a pallet. He had surgery on one of his shoulders initially and he experiences around his injury and surgery began to develop panic attacks. The patient is also been diagnosed with COPD and diabetes but continues to smoke and fears that if he completely quit smoking that he will gain a lot of weight again.   Interventions Strategy:  Cognitive/behavioral psychotherapeutic interventions  Participation Level:   Active  Participation Quality:  Appropriate      Behavioral Observation:  Fairly Groomed, Alert, and Appropriate.   Current Psychosocial Factors: Patient reports that trouble with daughter and son-in-law living at his house and causing trouble..  Content of Session:   Review current symptoms and continued work on therapeutic interventions around numerous medical issues and panic attacks.  Current Status:   Patient having much less panic attacks now that he has started xanax, but reports more retrieval based memory  issues and with increase in prozac increase in aggitation.  Patient Progress:   Stable  Target Goals:   Target goals include reduce the intensity, duration, and frequency of panic attacks, reduce the amount of benzodiazepine or other potentially dependence forming medications.  Last Reviewed:   05/20/2013  Goals Addressed Today:    Today we worked on issues of reducing the intensity and frequency of panic attacks  Impression/Diagnosis:   the patient appears to be doing with panic attacks along with anxiety and depression. He has numerous medical issues including COPD, diabetes, and other issues as well as liver disease. The patient is not taking very good care of himself and it was stressed specifically that he is going to need to quit smoking cigarettes and completely stopped caffeinated and sugar drinks if he is going to have any hope of improving his quality of life. The patient was amenable to initially try to stop the caffeinated drinks and we developed a plan for this. After that we will begin to address the smoking.    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

## 2013-05-27 ENCOUNTER — Ambulatory Visit: Payer: Self-pay | Admitting: Gastroenterology

## 2013-05-27 ENCOUNTER — Telehealth: Payer: Self-pay | Admitting: Gastroenterology

## 2013-05-27 NOTE — Telephone Encounter (Signed)
Wife LMOM to cx OV and needs to [facility]

## 2013-05-27 NOTE — Telephone Encounter (Signed)
Pt was a no show

## 2013-05-29 ENCOUNTER — Ambulatory Visit: Payer: Self-pay | Attending: Internal Medicine | Admitting: Internal Medicine

## 2013-05-29 ENCOUNTER — Encounter (HOSPITAL_COMMUNITY): Payer: Self-pay | Admitting: Psychiatry

## 2013-05-29 ENCOUNTER — Ambulatory Visit (INDEPENDENT_AMBULATORY_CARE_PROVIDER_SITE_OTHER): Payer: Self-pay | Admitting: Psychiatry

## 2013-05-29 VITALS — BP 126/76 | HR 57 | Temp 98.2°F | Resp 16 | Wt 203.8 lb

## 2013-05-29 VITALS — BP 120/70 | Ht 71.0 in | Wt 201.0 lb

## 2013-05-29 DIAGNOSIS — G8929 Other chronic pain: Secondary | ICD-10-CM

## 2013-05-29 DIAGNOSIS — F17200 Nicotine dependence, unspecified, uncomplicated: Secondary | ICD-10-CM

## 2013-05-29 DIAGNOSIS — M542 Cervicalgia: Secondary | ICD-10-CM | POA: Insufficient documentation

## 2013-05-29 DIAGNOSIS — F329 Major depressive disorder, single episode, unspecified: Secondary | ICD-10-CM

## 2013-05-29 DIAGNOSIS — F410 Panic disorder [episodic paroxysmal anxiety] without agoraphobia: Secondary | ICD-10-CM

## 2013-05-29 DIAGNOSIS — F332 Major depressive disorder, recurrent severe without psychotic features: Secondary | ICD-10-CM

## 2013-05-29 DIAGNOSIS — F4001 Agoraphobia with panic disorder: Secondary | ICD-10-CM

## 2013-05-29 DIAGNOSIS — Z23 Encounter for immunization: Secondary | ICD-10-CM

## 2013-05-29 DIAGNOSIS — M549 Dorsalgia, unspecified: Secondary | ICD-10-CM | POA: Insufficient documentation

## 2013-05-29 DIAGNOSIS — F411 Generalized anxiety disorder: Secondary | ICD-10-CM

## 2013-05-29 MED ORDER — FLUOXETINE HCL 20 MG PO CAPS: 20.0000 mg | ORAL_CAPSULE | Freq: Every day | ORAL | Status: DC

## 2013-05-29 MED ORDER — CYCLOBENZAPRINE HCL 10 MG PO TABS: 10.0000 mg | ORAL_TABLET | Freq: Three times a day (TID) | ORAL | Status: DC | PRN

## 2013-05-29 MED ORDER — IBUPROFEN 600 MG PO TABS: 600.0000 mg | ORAL_TABLET | Freq: Three times a day (TID) | ORAL | Status: DC | PRN

## 2013-05-29 MED ORDER — ALPRAZOLAM 0.5 MG PO TABS: 0.5000 mg | ORAL_TABLET | Freq: Three times a day (TID) | ORAL | Status: DC

## 2013-05-29 NOTE — Progress Notes (Signed)
Patient ID: Richard [name], male   DOB: March 30, 1966, 47 y.o.   MRN: [id number] Patient ID: Richard [name], male   DOB: 1966-04-29, 47 y.o.   MRN: [id number] Patient ID: Richard [name], male   DOB: 1966/01/09, 47 y.o.   MRN: [id number] [facility] [address] Progress Note [name] MRN: [id number] DOB: [birth year] Age: 47 y.o.  Date: 05/29/2013  Chief Complaint  Patient presents with  . Anxiety  . Depression  . Follow-up   History of Chief Complaint:   This 47 year old Caucasian male who came for his followup appointment. He is currently living with his wife, his 46 year old daughter her husband and 2 children, and his 39 year old son in [place]. He is unemployed and applying for disability.  The patient states that he has been employed for 5 years. 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 His last job however was at [employer]. He got injured on the job and both shoulders were torn. He has not been able to work ever since and he feels very badly about this. 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  Since he's not been able to work the patient has been having increasingly depressed and anxious. He feels like his body gives out when he is trying to exert himself even in a minor way. He's been in the ER recently for chest pain which was ruled out as a panic attack. He also saw cardiologist this week and will be having a stress test. His blood pressure was too low and his lisinopril was cut down. He is also trying to quit smoking by using electronic cigarettes.  The patient is having panic attacks approximately once a week and he is also having difficulty sleeping. His mood is low and he is still stressed financially. He is increased the Prozac to 20 mg per day which has helped to some degree. The patient returns today after four-week some his last visit. Last Time we increased his Prozac from 20-40 mg every morning. He claims this made him see things like  trees moving he is dropped back down to 20 mg per day and he is no longer having these visual hallucinations. He does feel like the Xanax helps tremendously when he takes it on a schedule. He is getting out a little bit more and going to his father-in-law's house and helping him repair lawnmowers and blowers. He still in a lot of pain in his lower back and neck and apparently his neurosurgeon would like him to have surgery. He wants to hold off. At times he has fleeting suicidal ideation but claims he would never act on it. He's mostly depressed because he can't do all the things he used to do such as work with heavy equipment or do lawn work. He is still waiting to hear about his disability  Anxiety Presents for follow-up visit. Symptoms include nervous/anxious behavior.     Review of Systems  Constitutional: Positive for fatigue.  Gastrointestinal: Negative.   Musculoskeletal: Positive for back pain.  Skin: Negative.   Psychiatric/Behavioral: Positive for sleep disturbance and dysphoric mood. The patient is nervous/anxious.     Physical Exam Vitals: BP 120/70  Ht 5\' 11"  (1.803 m)  Wt 201 lb (91.173 kg)  BMI 28.05 kg/m2   Past Psychiatric History: Diagnosis: none  Hospitalizations: none  Outpatient Care: none  Substance Abuse Care: none  Self-Mutilation: none  Suicidal Attempts: none  Violent Behaviors: none  Allergies: Allergies  Allergen Reactions  . Mushroom Ext Cmplx(Shiitake-Reishi-Mait) Anaphylaxis    Rapid heart rate.  . Penicillins Anaphylaxis   Medical History: Past Medical History  Diagnosis Date  . Chest pain     + palpitations; cath 2005- 30-40% mid LAD, 20% D1, 20% cx, OM, 20-30% RCA, and EF-55%  . COPD (chronic obstructive pulmonary disease)   . GERD (gastroesophageal reflux disease)   . Hyperlipemia   . Hypertension   . Depression   . Colitis 1990  . Tobacco abuse     1/2 pack per day  . Diabetes mellitus   . Gastric ulcer 2003; 2012    2003: +  esophagitis; negative H.pylori serology  2012: Dr. Darrick Penna, mild gastritis, Bravo PH probe placement, negative H.pylori  . Hepatic steatosis   . Chronic low back pain   . Panic attacks   . Asthma    Surgical History: Past Surgical History  Procedure Laterality Date  . Colonoscopy  1990  . Shoulder surgery      Right acromioclavicular joint arthrosis  . Neck mass excision    . Bravo ph study  05/03/2011    ZOX:WRUE gastritis/normal esophagus and duodenum  . Cardiac catheterization  2005   Family History: family history includes ADD / ADHD in his daughter; Alcohol abuse in his father and mother; Anxiety disorder in his sister and sister; Dementia in his cousin and paternal uncle; Depression in his sister; Diabetes in his brother and father; Heart attack (age of onset: 29) in his brother; Heart attack (age of onset: 36) in his father; Hypertension in his brother, brother, and brother; Lung cancer in his mother; Seizures in his brother. There is no history of Colon cancer, Drug abuse, Bipolar disorder, OCD, Paranoid behavior, Schizophrenia, Sexual abuse, or Physical abuse. Reviewed again today and nothing new.   Current Medications:  Current Outpatient Prescriptions  Medication Sig Dispense Refill  . acetaminophen (TYLENOL) 325 MG tablet Take 650 mg by mouth every 6 (six) hours as needed for pain or fever.       [name] albuterol (VENTOLIN HFA) 108 (90 BASE) MCG/ACT inhaler Inhale 2 puffs into the lungs every 6 (six) hours as needed for wheezing or shortness of breath.       . ALPRAZolam (XANAX) 0.5 MG tablet Take 1 tablet (0.5 mg total) by mouth 3 (three) times daily.  90 tablet  2  . aspirin 81 MG tablet Take 81 mg by mouth at bedtime.       [name] esomeprazole (NEXIUM) 40 MG capsule Take 40 mg by mouth 2 (two) times daily.      [name] FLUoxetine (PROZAC) 20 MG capsule Take 1 capsule (20 mg total) by mouth daily.  30 capsule  2  . Fluticasone-Salmeterol (ADVAIR) 250-50 MCG/DOSE AEPB Inhale 1 puff into the  lungs every 12 (twelve) hours.      [name] HYDROcodone-acetaminophen (NORCO/VICODIN) 5-325 MG per tablet Take 1 tablet by mouth every 6 (six) hours as needed for pain.  90 tablet  1  . lisinopril (PRINIVIL,ZESTRIL) 10 MG tablet Take 1 tablet (10 mg total) by mouth daily.  90 tablet  1  . lovastatin (MEVACOR) 20 MG tablet Take 1 tablet (20 mg total) by mouth at bedtime.  30 tablet  6  . metFORMIN (GLUCOPHAGE) 500 MG tablet Take 1 tablet (500 mg total) by mouth 2 (two) times daily with a meal.  30 tablet  2  . metoprolol succinate (TOPROL-XL) 50 MG 24 hr tablet Take 75 mg  by mouth daily.      . nitroGLYCERIN (NITROSTAT) 0.4 MG SL tablet Place 0.4 mg under the tongue every 5 (five) minutes as needed for chest pain.        No current facility-administered medications for this visit.    Previous Psychotropic Medications: Medication Dose   Xanax     Substance Abuse History in the last 12 months: Substance Age of 1st Use Last Use Amount Specific Type  Nicotine  17  2 hours ago  1  cigarette  Alcohol  22  38      Cannabis  none        Opiates  36  today  2.5 mg  hyrdocodone  Cocaine  none        Methamphetamines  none        LSD  none        Ecstasy  none         Benzodiazepines  22  started in early thrities  0.5 mg  Xanax  Caffeine  childhood  this AM  1 cup  Mt Dew  Inhalants  none        Others:      Sugar  11  this AM  20 tsps  in the [facility]  Medical Consequences of Substance Abuse: pain Legal Consequences of Substance Abuse: none Family Consequences of Substance Abuse: none Blackouts:  No DT's:  No Withdrawal Symptoms:  Yes Tremors  Social History: Patient lives with his life, daughter and her husband with 2 grandbabies.  He has 2 sons and the daughter.  He is a Engineer, agricultural.    Mental Status Examination/Evaluation: Objective:  Appearance: Casual  Eye Contact::  Good  Speech:  Clear and Coherent  Volume:  Normal  Mood:  worried, anxious   Affect:  Congruent  Thought  Process:  Coherent, Intact and Linear  Orientation:  Full (Time, Place, and Person)  Thought Content:  WDL  Suicidal Thoughts:  No  Homicidal Thoughts:  No  Judgement:  Good  Insight:  Fair  Psychomotor Activity:  Normal  Akathisia:  No  Handed:  Right  AIMS (if indicated):    Assets:  Communication Skills Desire for Improvement   Lab Results:  Results for orders placed in visit on 04/09/13 (from the past 8736 hour(s))  POCT GLYCOSYLATED HEMOGLOBIN (HGB A1C)   Collection Time    04/09/13 12:04 PM      Result Value Range   Hemoglobin A1C 5.6    Results for orders placed in visit on 02/06/13 (from the past 8736 hour(s))  LIPID PANEL   Collection Time    02/24/13 10:55 AM      Result Value Range   Cholesterol 152  0 - 200 mg/dL   Triglycerides 409  <811 mg/dL   HDL 33 (*) >91 mg/dL   Total CHOL/HDL Ratio 4.6     VLDL 21  0 - 40 mg/dL   LDL Cholesterol 98  0 - 99 mg/dL  BASIC METABOLIC PANEL   Collection Time    02/24/13 10:55 AM      Result Value Range   Sodium 139  135 - 145 mEq/L   Potassium 4.7  3.5 - 5.3 mEq/L   Chloride 102  96 - 112 mEq/L   CO2 30  19 - 32 mEq/L   Glucose, Bld 127 (*) 70 - 99 mg/dL   BUN 12  6 - 23 mg/dL   Creat 4.78  2.95 - 6.21  mg/dL   Calcium 9.4  8.4 - 45.4 mg/dL  Results for orders placed during the hospital encounter of 02/05/13 (from the past 8736 hour(s))  TROPONIN I   Collection Time    02/05/13 12:50 PM      Result Value Range   Troponin I <0.30  <0.30 ng/mL  CBC WITH DIFFERENTIAL   Collection Time    02/05/13 12:50 PM      Result Value Range   WBC 6.4  4.0 - 10.5 K/uL   RBC 5.02  4.22 - 5.81 MIL/uL   Hemoglobin 16.0  13.0 - 17.0 g/dL   HCT 09.8  11.9 - 14.7 %   MCV 89.2  78.0 - 100.0 fL   MCH 31.9  26.0 - 34.0 pg   MCHC 35.7  30.0 - 36.0 g/dL   RDW 82.9  56.2 - 13.0 %   Platelets 221  150 - 400 K/uL   Neutrophils Relative % 54  43 - 77 %   Neutro Abs 3.4  1.7 - 7.7 K/uL   Lymphocytes Relative 36  12 - 46 %   Lymphs Abs  2.3  0.7 - 4.0 K/uL   Monocytes Relative 7  3 - 12 %   Monocytes Absolute 0.4  0.1 - 1.0 K/uL   Eosinophils Relative 3  0 - 5 %   Eosinophils Absolute 0.2  0.0 - 0.7 K/uL   Basophils Relative 0  0 - 1 %   Basophils Absolute 0.0  0.0 - 0.1 K/uL  COMPREHENSIVE METABOLIC PANEL   Collection Time    02/05/13 12:50 PM      Result Value Range   Sodium 138  135 - 145 mEq/L   Potassium 3.9  3.5 - 5.1 mEq/L   Chloride 103  96 - 112 mEq/L   CO2 26  19 - 32 mEq/L   Glucose, Bld 186 (*) 70 - 99 mg/dL   BUN 16  6 - 23 mg/dL   Creatinine, Ser 8.65  0.50 - 1.35 mg/dL   Calcium 9.3  8.4 - 78.4 mg/dL   Total Protein 6.5  6.0 - 8.3 g/dL   Albumin 3.7  3.5 - 5.2 g/dL   AST 11  0 - 37 U/L   ALT 14  0 - 53 U/L   Alkaline Phosphatase 52  39 - 117 U/L   Total Bilirubin 0.3  0.3 - 1.2 mg/dL   GFR calc non Af Amer >90  >90 mL/min   GFR calc Af Amer >90  >90 mL/min  TROPONIN I   Collection Time    02/05/13  3:25 PM      Result Value Range   Troponin I <0.30  <0.30 ng/mL  Results for orders placed during the hospital encounter of 11/25/12 (from the past 8736 hour(s))  TROPONIN I   Collection Time    11/25/12  1:30 AM      Result Value Range   Troponin I <0.30  <0.30 ng/mL  CBC WITH DIFFERENTIAL   Collection Time    11/25/12  1:30 AM      Result Value Range   WBC 9.3  4.0 - 10.5 K/uL   RBC 4.92  4.22 - 5.81 MIL/uL   Hemoglobin 15.5  13.0 - 17.0 g/dL   HCT 69.6  29.5 - 28.4 %   MCV 87.4  78.0 - 100.0 fL   MCH 31.5  26.0 - 34.0 pg   MCHC 36.0  30.0 - 36.0 g/dL   RDW 12.8  11.5 - 15.5 %   Platelets 230  150 - 400 K/uL   Neutrophils Relative % 36 (*) 43 - 77 %   Neutro Abs 3.4  1.7 - 7.7 K/uL   Lymphocytes Relative 56 (*) 12 - 46 %   Lymphs Abs 5.2 (*) 0.7 - 4.0 K/uL   Monocytes Relative 6  3 - 12 %   Monocytes Absolute 0.6  0.1 - 1.0 K/uL   Eosinophils Relative 2  0 - 5 %   Eosinophils Absolute 0.2  0.0 - 0.7 K/uL   Basophils Relative 0  0 - 1 %   Basophils Absolute 0.0  0.0 - 0.1 K/uL   BASIC METABOLIC PANEL   Collection Time    11/25/12  1:30 AM      Result Value Range   Sodium 135  135 - 145 mEq/L   Potassium 3.0 (*) 3.5 - 5.1 mEq/L   Chloride 97  96 - 112 mEq/L   CO2 25  19 - 32 mEq/L   Glucose, Bld 114 (*) 70 - 99 mg/dL   BUN 17  6 - 23 mg/dL   Creatinine, Ser 1.61  0.50 - 1.35 mg/dL   Calcium 9.4  8.4 - 09.6 mg/dL   GFR calc non Af Amer 87 (*) >90 mL/min   GFR calc Af Amer >90  >90 mL/min  Results for orders placed in visit on 11/20/12 (from the past 8736 hour(s))  COMPREHENSIVE METABOLIC PANEL   Collection Time    10/23/12 12:00 AM      Result Value Range   WBC 8.2     Hemoglobin 15.4  13.5 - 17.5 g/dL   HCT 44     platelet count 292    COMPREHENSIVE METABOLIC PANEL   Collection Time    10/23/12 12:00 AM      Result Value Range   Sodium 137  137 - 147 mmol/L   Potassium 4.3     CO2 25     Glucose 131     BUN 15  4 - 21 mg/dL   Creat 0.45     Total Bilirubin 0.4     Alkaline Phosphatase 45     AST 11     ALT 29  10 - 40 U/L   Albumin 4.0     Calcium 9.0     Hemoglobin A1C 6.9 (*) 4.0 - 6.0 %  Results for orders placed during the hospital encounter of 10/14/12 (from the past 8736 hour(s))  CBC WITH DIFFERENTIAL   Collection Time    10/14/12  3:50 PM      Result Value Range   WBC 10.6 (*) 4.0 - 10.5 K/uL   RBC 5.75  4.22 - 5.81 MIL/uL   Hemoglobin 17.9 (*) 13.0 - 17.0 g/dL   HCT 40.9  81.1 - 91.4 %   MCV 88.2  78.0 - 100.0 fL   MCH 31.1  26.0 - 34.0 pg   MCHC 35.3  30.0 - 36.0 g/dL   RDW 78.2  95.6 - 21.3 %   Platelets 223  150 - 400 K/uL   Neutrophils Relative % 89 (*) 43 - 77 %   Neutro Abs 9.4 (*) 1.7 - 7.7 K/uL   Lymphocytes Relative 7 (*) 12 - 46 %   Lymphs Abs 0.8  0.7 - 4.0 K/uL   Monocytes Relative 4  3 - 12 %   Monocytes Absolute 0.4  0.1 - 1.0 K/uL   Eosinophils Relative 0  0 -  5 %   Eosinophils Absolute 0.0  0.0 - 0.7 K/uL   Basophils Relative 0  0 - 1 %   Basophils Absolute 0.0  0.0 - 0.1 K/uL  COMPREHENSIVE METABOLIC  PANEL   Collection Time    10/14/12  3:50 PM      Result Value Range   Sodium 140  135 - 145 mEq/L   Potassium 3.9  3.5 - 5.1 mEq/L   Chloride 97  96 - 112 mEq/L   CO2 27  19 - 32 mEq/L   Glucose, Bld 132 (*) 70 - 99 mg/dL   BUN 29 (*) 6 - 23 mg/dL   Creatinine, Ser 1.61  0.50 - 1.35 mg/dL   Calcium 9.9  8.4 - 09.6 mg/dL   Total Protein 8.3  6.0 - 8.3 g/dL   Albumin 4.4  3.5 - 5.2 g/dL   AST 11  0 - 37 U/L   ALT 19  0 - 53 U/L   Alkaline Phosphatase 70  39 - 117 U/L   Total Bilirubin 0.4  0.3 - 1.2 mg/dL   GFR calc non Af Amer 65 (*) >90 mL/min   GFR calc Af Amer 75 (*) >90 mL/min  GLUCOSE, CAPILLARY   Collection Time    10/14/12  3:56 PM      Result Value Range   Glucose-Capillary 118 (*) 70 - 99 mg/dL   Comment 1 Documented in Chart     Comment 2 Notify RN    LIPASE, BLOOD   Collection Time    10/14/12  4:50 PM      Result Value Range   Lipase 14  11 - 59 U/L  URINALYSIS, ROUTINE W REFLEX MICROSCOPIC   Collection Time    10/14/12  6:00 PM      Result Value Range   Color, Urine YELLOW  YELLOW   APPearance CLEAR  CLEAR   Specific Gravity, Urine >1.030 (*) 1.005 - 1.030   pH 6.0  5.0 - 8.0   Glucose, UA NEGATIVE  NEGATIVE mg/dL   Hgb urine dipstick NEGATIVE  NEGATIVE   Bilirubin Urine SMALL (*) NEGATIVE   Ketones, ur NEGATIVE  NEGATIVE mg/dL   Protein, ur TRACE (*) NEGATIVE mg/dL   Urobilinogen, UA 0.2  0.0 - 1.0 mg/dL   Nitrite NEGATIVE  NEGATIVE   Leukocytes, UA NEGATIVE  NEGATIVE  URINE MICROSCOPIC-ADD ON   Collection Time    10/14/12  6:00 PM      Result Value Range   WBC, UA 0-2  <3 WBC/hpf   Bacteria, UA RARE  RARE   Casts HYALINE CASTS (*) NEGATIVE   Urine-Other MUCOUS PRESENT    Results for orders placed in visit on 08/20/12 (from the past 8736 hour(s))  VITAMIN D 25 HYDROXY   Collection Time    10/23/12  8:20 AM      Result Value Range   Vit D, 25-Hydroxy 41  30 - 89 ng/mL    Assessment:   AXIS I Generalized Anxiety Disorder, Major Depression,  Recurrent severe and Panic Disorder  AXIS II Deferred  AXIS III Past Medical History  Diagnosis Date  . Chest pain     + palpitations; cath 2005- 30-40% mid LAD, 20% D1, 20% cx, OM, 20-30% RCA, and EF-55%  . COPD (chronic obstructive pulmonary disease)   . GERD (gastroesophageal reflux disease)   . Hyperlipemia   . Hypertension   . Depression   . Colitis 1990  . Tobacco abuse  1/2 pack per day  . Diabetes mellitus   . Gastric ulcer 2003; 2012    2003: + esophagitis; negative H.pylori serology  2012: Dr. Darrick Penna, mild gastritis, Bravo PH probe placement, negative H.pylori  . Hepatic steatosis   . Chronic low back pain   . Panic attacks   . Asthma      AXIS IV other psychosocial or environmental problems  AXIS V 41-50 serious symptoms   Treatment Plan/Recommendations: Laboratory:  Vitamin D  Psychotherapy: supportive psychotherapy  Medications: The patient needs to stay on Prozac 20 mg every morning and Xanax 0.5 mg 3 times a day on a scheduled basis   Routine PRN Medications:  No  Consultations: none  Safety Concerns:  none  Other:  Return in 2 months     MEDICATIONS this encounter: Meds ordered this encounter  Medications  . FLUoxetine (PROZAC) 20 MG capsule    Sig: Take 1 capsule (20 mg total) by mouth daily.    Dispense:  30 capsule    Refill:  2  . ALPRAZolam (XANAX) 0.5 MG tablet    Sig: Take 1 tablet (0.5 mg total) by mouth 3 (three) times daily.    Dispense:  90 tablet    Refill:  2   Medical Decision Making Problem Points:  Established problem, stable/improving (1), Review of last therapy session (1) and Review of psycho-social stressors (1) Data Points:  Review or order clinical lab tests (1) Review of medication regiment & side effects (2) Review of new medications or change in dosage (2)  I certify that outpatient services furnished can reasonably be expected to improve the patient's condition.   Diannia Ruder, MD

## 2013-05-29 NOTE — Progress Notes (Signed)
Patient ID: TAIYO KOZMA, male   DOB: [birth year], 47 y.o.   MRN: [id number]  Patient Demographics  Tayte Mcwherter, is a 47 y.o. male  WUJ:[id number]  [id number]  DOB - 1965/10/02  Chief Complaint  Patient presents with  . Back Pain  . Neck Pain        Subjective:   Ovadia Mccreery with History of chronic neck and lower back pain, type 2 diabetes mellitus, dyslipidemia, GERD is here as he continues to have chronic neck and back pain which has been present for the last 20 years. Denies any lower extremity weakness, no paresthesias, no bowel bladder incontinence. He was recently referred to local in clinic which declined to accept him as a patient.  Denies any subjective complaints except as above, no active headache, no chest abdominal pain at this time, not short of breath. No focal weakness which is new.    Objective:    Patient Active Problem List   Diagnosis Date Noted  . Unspecified vitamin D deficiency 08/20/2012  . Arteriosclerotic cardiovascular disease (ASCVD) 04/11/2012  . Chronic low back pain   . Palpitations 04/21/2011  . Hypertension   . Anxiety and depression   . DIABETES MELLITUS-TYPE II 10/03/2010  . TOBACCO ABUSE 09/14/2010  . CHRONIC OBSTRUCTIVE PULMONARY DISEASE 09/14/2010  . HYPERLIPIDEMIA 11/25/2009  . GASTROESOPHAGEAL REFLUX DISEASE 04/05/2009  . Hepatic steatosis 04/05/2009     Filed Vitals:   05/29/13 1629  BP: 126/76  Pulse: 57  Temp: 98.2 F (36.8 C)  Resp: 16  Weight: 203 lb 12.8 oz (92.443 kg)  SpO2: 100%     Exam   Awake Alert, Oriented X 3, No new F.N deficits, Normal affect, strength 5 / 5 in both lower extremities sensation intact in both lower extremities [place].AT,PERRAL Supple Neck,No JVD, No cervical lymphadenopathy appriciated.  Symmetrical Chest wall movement, Good air movement bilaterally, CTAB RRR,No Gallops,Rubs or new Murmurs, No Parasternal Heave +ve B.Sounds, Abd Soft, Non tender, No organomegaly appriciated, No  rebound - guarding or rigidity. No Cyanosis, Clubbing or edema, No new Rash or bruise     Data Review   Lab Results  Component Value Date   WBC 6.4 02/05/2013   HGB 16.0 02/05/2013   HCT 44.8 02/05/2013   MCV 89.2 02/05/2013   PLT 221 02/05/2013      Chemistry      Component Value Date/Time   NA 139 02/24/2013 1055   NA 137 10/23/2012   K 4.7 02/24/2013 1055   K 4.3 10/23/2012   CL 102 02/24/2013 1055   CO2 30 02/24/2013 1055   CO2 25 10/23/2012   BUN 12 02/24/2013 1055   BUN 15 10/23/2012   CREATININE 1.07 02/24/2013 1055   CREATININE 0.93 02/05/2013 1250      Component Value Date/Time   CALCIUM 9.4 02/24/2013 1055   CALCIUM 9.0 10/23/2012   ALKPHOS 52 02/05/2013 1250   ALKPHOS 45 10/23/2012   AST 11 02/05/2013 1250   AST 11 10/23/2012   ALT 14 02/05/2013 1250   BILITOT 0.3 02/05/2013 1250   BILITOT 0.4 10/23/2012       Lab Results  Component Value Date   HGBA1C 5.6 04/09/2013    Lab Results  Component Value Date   CHOL 152 02/24/2013   HDL 33* 02/24/2013   LDLCALC 98 02/24/2013   TRIG 106 02/24/2013   CHOLHDL 4.6 02/24/2013    Lab Results  Component Value Date   TSH 0.958 04/21/2011    No results  found for this basename: PSA      Prior to Admission medications   Medication Sig Start Date End Date Taking? Authorizing Provider  acetaminophen (TYLENOL) 325 MG tablet Take 650 mg by mouth every 6 (six) hours as needed for pain or fever.     Historical Provider, MD  albuterol (VENTOLIN HFA) 108 (90 BASE) MCG/ACT inhaler Inhale 2 puffs into the lungs every 6 (six) hours as needed for wheezing or shortness of breath.     Historical Provider, MD  ALPRAZolam Prudy Feeler) 0.5 MG tablet Take 1 tablet (0.5 mg total) by mouth 3 (three) times daily. 05/29/13   Diannia Ruder, MD  aspirin 81 MG tablet Take 81 mg by mouth at bedtime.     Historical Provider, MD  cyclobenzaprine (FLEXERIL) 10 MG tablet Take 1 tablet (10 mg total) by mouth 3 (three) times daily as needed for muscle spasms. 05/29/13    Leroy Sea, MD  esomeprazole (NEXIUM) 40 MG capsule Take 40 mg by mouth 2 (two) times daily. 12/25/11   Tiffany Kocher, PA-C  FLUoxetine (PROZAC) 20 MG capsule Take 1 capsule (20 mg total) by mouth daily. 05/29/13 05/29/14  Diannia Ruder, MD  Fluticasone-Salmeterol (ADVAIR) 250-50 MCG/DOSE AEPB Inhale 1 puff into the lungs every 12 (twelve) hours.    Historical Provider, MD  HYDROcodone-acetaminophen (NORCO/VICODIN) 5-325 MG per tablet Take 1 tablet by mouth every 6 (six) hours as needed for pain. 04/09/13   Dorothea Ogle, MD  ibuprofen (ADVIL,MOTRIN) 600 MG tablet Take 1 tablet (600 mg total) by mouth every 8 (eight) hours as needed for moderate pain. 05/29/13   Leroy Sea, MD  lisinopril (PRINIVIL,ZESTRIL) 10 MG tablet Take 1 tablet (10 mg total) by mouth daily. 02/25/13   Laqueta Linden, MD  lovastatin (MEVACOR) 20 MG tablet Take 1 tablet (20 mg total) by mouth at bedtime. 12/24/12   Kathlen Brunswick, MD  metFORMIN (GLUCOPHAGE) 500 MG tablet Take 1 tablet (500 mg total) by mouth 2 (two) times daily with a meal. 04/28/13   Jeanann Lewandowsky, MD  metoprolol succinate (TOPROL-XL) 50 MG 24 hr tablet Take 75 mg by mouth daily. 11/28/12   Kathlen Brunswick, MD  nitroGLYCERIN (NITROSTAT) 0.4 MG SL tablet Place 0.4 mg under the tongue every 5 (five) minutes as needed for chest pain.     Historical Provider, MD     Assessment & Plan    Chronic low back pain. Exam is stable, no weakness in both lower extremities, no loss of sensation, MRI done last year December as below showing minimal disc disease. We'll place him on Flexeril and Motrin, we'll refer him to sports medicine clinic for physiotherapy along with [facility] pain clinic. Patient was declined in the local pain clinic here.  Findings: Vertebral body height, signal and alignment are normal.  No pars interarticularis defect is present. The conus medullaris  is normal in signal and position. Imaged intra-abdominal contents  appear  normal.  The T11-12 and T12-L1 levels are imaged in the sagittal plane only  and negative.  L1-2: No change in a shallow disc bulge without central canal or  foraminal narrowing.  L2-3: Negative.  L3-4: Negative.  L4-5: Minimal disc bulge without central canal or foraminal  stenosis.  L5-S1: Negative.  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      DM-2  Good control she will see is 5.6, he is on Glucophage  which will be continued    Nonobstructive CAD. Recent lites he scan showing no high grade reversible ischemia, continue followup with cardiologist, continue risk factor modulation     Routine health maintenance.  Flu shot given   Leroy Sea M.D on 05/29/2013 at 5:10 PM

## 2013-05-29 NOTE — Progress Notes (Signed)
Patient here for chronic back pain and neck pain Was referred to pain management but said he still has Not heard anything

## 2013-05-30 ENCOUNTER — Encounter (HOSPITAL_COMMUNITY): Payer: Self-pay | Admitting: Psychology

## 2013-05-30 NOTE — Progress Notes (Signed)
Patient:  Richard [name]   DOB: 10-31-1965  MR Number: [id number]  Location: BEHAVIORAL [facility] [address] [place] [address] Dept: [phone]  Start: 11 AM End: 12 PM  Provider/Observer:     Hershal Coria PSYD  Chief Complaint:      Chief Complaint  Patient presents with  . Anxiety  . Depression  . Stress    Reason For Service:    The patient was referred by Dr. Dietrich Pates because of severe anxiety and foot appear to be panic attacks. The patient reports that he has been having a lot of anxiety and panic attacks. He has numerous medical issues including severe issues with diabetes, COPD, fatty liver disease and orthopedic issues. He also has had a long history of depression anxiety as well. Anxiety and depression have been going on for least 5 years and correlate with his health issues and financial difficulties. The patient relates this deterioration to an injury at work where he injured both of his shoulders while pulling a pallet. He had surgery on one of his shoulders initially and he experiences around his injury and surgery began to develop panic attacks. The patient is also been diagnosed with COPD and diabetes but continues to smoke and fears that if he completely quit smoking that he will gain a lot of weight again.   Interventions Strategy:  Cognitive/behavioral psychotherapeutic interventions  Participation Level:   Active  Participation Quality:  Appropriate      Behavioral Observation:  Fairly Groomed, Alert, and Appropriate.   Current Psychosocial Factors: The patient continues to struggle with financial difficulties and his inability to help out financially. The patient reports that he continues with severe panic attacks..  Content of Session:   Review current symptoms and continued work on therapeutic interventions around numerous medical issues and panic attacks.  Current  Status:   The patient reports he is now been 2 weeks without any narcotic pain medications due to lack of money to buy them. He reports that he has been in significant pain but is willing to try another 2 weeks without the intensity of his body adjusts some without them..  Patient Progress:   Stable  Target Goals:   Target goals include reduce the intensity, duration, and frequency of panic attacks, reduce the amount of benzodiazepine or other potentially dependence forming medications.  Last Reviewed:   04/04/2013  Goals Addressed Today:    Today we worked on issues of reducing the intensity and frequency of panic attacks  Impression/Diagnosis:   the patient appears to be doing with panic attacks along with anxiety and depression. He has numerous medical issues including COPD, diabetes, and other issues as well as liver disease. The patient is not taking very good care of himself and it was stressed specifically that he is going to need to quit smoking cigarettes and completely stopped caffeinated and sugar drinks if he is going to have any hope of improving his quality of life. The patient was amenable to initially try to stop the caffeinated drinks and we developed a plan for this. After that we will begin to address the smoking.    Diagnosis:    Axis I:  No diagnosis found.      Axis II: No diagnosis

## 2013-06-19 ENCOUNTER — Ambulatory Visit (INDEPENDENT_AMBULATORY_CARE_PROVIDER_SITE_OTHER): Payer: Self-pay | Admitting: Psychology

## 2013-06-19 ENCOUNTER — Encounter (HOSPITAL_COMMUNITY): Payer: Self-pay | Admitting: Psychology

## 2013-06-19 DIAGNOSIS — F4001 Agoraphobia with panic disorder: Secondary | ICD-10-CM

## 2013-06-19 DIAGNOSIS — F341 Dysthymic disorder: Secondary | ICD-10-CM

## 2013-06-19 DIAGNOSIS — F329 Major depressive disorder, single episode, unspecified: Secondary | ICD-10-CM

## 2013-06-19 NOTE — Progress Notes (Signed)
Patient:  Richard [name]   DOB: [birth year]  MR Number: [id number]  Location: BEHAVIORAL [facility] [address] [place] [address] Dept: [phone]  Start: 11 AM End: 12 PM  Provider/Observer:     Hershal Coria PSYD  Chief Complaint:      Chief Complaint  Patient presents with  . Panic Attack  . Stress    Reason For Service:    The patient was referred by Dr. Dietrich Pates because of severe anxiety and foot appear to be panic attacks. The patient reports that he has been having a lot of anxiety and panic attacks. He has numerous medical issues including severe issues with diabetes, COPD, fatty liver disease and orthopedic issues. He also has had a long history of depression anxiety as well. Anxiety and depression have been going on for least 5 years and correlate with his health issues and financial difficulties. The patient relates this deterioration to an injury at work where he injured both of his shoulders while pulling a pallet. He had surgery on one of his shoulders initially and he experiences around his injury and surgery began to develop panic attacks. The patient is also been diagnosed with COPD and diabetes but continues to smoke and fears that if he completely quit smoking that he will gain a lot of weight again.   Interventions Strategy:  Cognitive/behavioral psychotherapeutic interventions  Participation Level:   Active  Participation Quality:  Appropriate      Behavioral Observation:  Fairly Groomed, Alert, and Appropriate.   Current Psychosocial Factors: Patient reports that he has had difficulty from the Prozac but his been reduced back down and doing better without the visual disturbance.  He has had some panic attacks but the frequency has reduced..  Content of Session:   Review current symptoms and continued work on therapeutic interventions around numerous medical issues and panic  attacks.  Current Status:   Patient having much less panic attacks now that he has started xanax, but reports more retrieval based memory issues and with increase in prozac increase in aggitation.  He has still had some but the frequency has reduced.  Patient Progress:   Stable  Target Goals:   Target goals include reduce the intensity, duration, and frequency of panic attacks, reduce the amount of benzodiazepine or other potentially dependence forming medications.  Last Reviewed:   06/19/2013  Goals Addressed Today:    Today we worked on issues of reducing the intensity and frequency of panic attacks  Impression/Diagnosis:   the patient appears to be doing with panic attacks along with anxiety and depression. He has numerous medical issues including COPD, diabetes, and other issues as well as liver disease. The patient is not taking very good care of himself and it was stressed specifically that he is going to need to quit smoking cigarettes and completely stopped caffeinated and sugar drinks if he is going to have any hope of improving his quality of life. The patient was amenable to initially try to stop the caffeinated drinks and we developed a plan for this. After that we will begin to address the smoking.    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

## 2013-06-27 ENCOUNTER — Other Ambulatory Visit: Payer: Self-pay | Admitting: Cardiology

## 2013-07-02 ENCOUNTER — Ambulatory Visit: Payer: Self-pay

## 2013-07-04 ENCOUNTER — Ambulatory Visit: Payer: Self-pay

## 2013-07-09 ENCOUNTER — Ambulatory Visit: Payer: Self-pay

## 2013-07-21 ENCOUNTER — Ambulatory Visit (INDEPENDENT_AMBULATORY_CARE_PROVIDER_SITE_OTHER): Payer: Self-pay | Admitting: Psychiatry

## 2013-07-21 ENCOUNTER — Encounter (HOSPITAL_COMMUNITY): Payer: Self-pay | Admitting: Psychiatry

## 2013-07-21 VITALS — BP 120/70 | Ht 71.0 in | Wt 206.0 lb

## 2013-07-21 DIAGNOSIS — F4001 Agoraphobia with panic disorder: Secondary | ICD-10-CM

## 2013-07-21 DIAGNOSIS — M545 Low back pain: Secondary | ICD-10-CM

## 2013-07-21 DIAGNOSIS — G8929 Other chronic pain: Secondary | ICD-10-CM

## 2013-07-21 DIAGNOSIS — F329 Major depressive disorder, single episode, unspecified: Secondary | ICD-10-CM

## 2013-07-21 DIAGNOSIS — F410 Panic disorder [episodic paroxysmal anxiety] without agoraphobia: Secondary | ICD-10-CM

## 2013-07-21 DIAGNOSIS — F17200 Nicotine dependence, unspecified, uncomplicated: Secondary | ICD-10-CM

## 2013-07-21 DIAGNOSIS — F419 Anxiety disorder, unspecified: Secondary | ICD-10-CM

## 2013-07-21 DIAGNOSIS — F411 Generalized anxiety disorder: Secondary | ICD-10-CM

## 2013-07-21 DIAGNOSIS — F332 Major depressive disorder, recurrent severe without psychotic features: Secondary | ICD-10-CM

## 2013-07-21 MED ORDER — FLUOXETINE HCL 20 MG PO CAPS: 20.0000 mg | ORAL_CAPSULE | Freq: Every day | ORAL | Status: DC

## 2013-07-21 MED ORDER — ALPRAZOLAM 0.5 MG PO TABS: 0.5000 mg | ORAL_TABLET | Freq: Three times a day (TID) | ORAL | Status: DC

## 2013-07-21 NOTE — Progress Notes (Signed)
Patient ID: Richard [name], male   DOB: Aug 24, 1965, 48 y.o.   MRN: [id number] Patient ID: Richard [name], male   DOB: 28-Mar-1966, 48 y.o.   MRN: [id number] Patient ID: Richard [name], male   DOB: 12/23/1965, 48 y.o.   MRN: [id number] Patient ID: Richard [name], male   DOB: [birth year], 48 y.o.   MRN: [id number] [facility] [id number] Progress Note JULIE HENNION MRN: [id number] DOB: 08-17-1965 Age: 48 y.o.  Date: 07/21/2013  Chief Complaint  Patient presents with  . Anxiety  . Depression  . Follow-up   History of Chief Complaint:   This 48 year old Caucasian male who came for his followup appointment. He is currently living with his wife, his 55 year old daughter her husband and 2 children, and his 30 year old son in [place]. He is unemployed and applying for disability.  The patient states that he has been employed for 5 years. 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 He got injured on the job and both shoulders were torn. He has not been able to work ever since and he feels very badly about this. 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  Since he's not been able to work the patient has been having increasingly depressed and anxious. He feels like his body gives out when he is trying to exert himself even in a minor way. He's been in the ER recently for chest pain which was ruled out as a panic attack. He also saw cardiologist this week and will be having a stress test. His blood pressure was too low and his lisinopril was cut down. He is also trying to quit smoking by using electronic cigarettes.  The patient is having panic attacks approximately once a week and he is also having difficulty sleeping. His mood is low and he is still stressed financially. He is increased the Prozac to 20 mg per day which has helped to some degree. The patient returns today after 2 months. He is doing a little bit better. He's been getting  out more and he takes his dog with them. He still has a lot of trouble with getting panicky whenever he does physical work. He is reluctant to take the Xanax 3 times a day as I've instructed. I strongly suggested he do this so that his generalized level of anxiety can calm down. He agrees  Anxiety Presents for follow-up visit. Symptoms include nervous/anxious behavior.     Review of Systems  Constitutional: Positive for fatigue.  Gastrointestinal: Negative.   Musculoskeletal: Positive for back pain.  Skin: Negative.   Psychiatric/Behavioral: Positive for sleep disturbance and dysphoric mood. The patient is nervous/anxious.     Physical Exam Vitals: BP 120/70  Ht 5\' 11"  (1.803 m)  Wt 206 lb (93.441 kg)  BMI 28.74 kg/m2   Past Psychiatric History: Diagnosis: none  Hospitalizations: none  Outpatient Care: none  Substance Abuse Care: none  Self-Mutilation: none  Suicidal Attempts: none  Violent Behaviors: none   Allergies: Allergies  Allergen Reactions  . Mushroom Ext Cmplx(Shiitake-Reishi-Mait) Anaphylaxis    Rapid heart rate.  . Penicillins Anaphylaxis   Medical History: Past Medical History  Diagnosis Date  . Chest pain     + palpitations; cath 2005- 30-40% mid LAD, 20% D1, 20% cx, OM, 20-30% RCA, and EF-55%  . COPD (chronic obstructive pulmonary disease)   . GERD (gastroesophageal reflux disease)   .  Hyperlipemia   . Hypertension   . Depression   . Colitis 1990  . Tobacco abuse     1/2 pack per day  . Diabetes mellitus   . Gastric ulcer 2003; 2012    2003: + esophagitis; negative H.pylori serology  2012: Dr. Oneida Alar, mild gastritis, Bravo PH probe placement, negative H.pylori  . Hepatic steatosis   . Chronic low back pain   . Panic attacks   . Asthma    Surgical History: Past Surgical History  Procedure Laterality Date  . Colonoscopy  1990  . Shoulder surgery      Right acromioclavicular joint arthrosis  . Neck mass excision    . Bravo ph study   05/03/2011    [id number] gastritis/normal esophagus and duodenum  . Cardiac catheterization  2005   Family History: family history includes ADD / ADHD in his daughter; Alcohol abuse in his father and mother; Anxiety disorder in his sister and sister; Dementia in his cousin and paternal uncle; Depression in his sister; Diabetes in his brother and father; Heart attack (age of onset: 48) in his brother; Heart attack (age of onset: 1) in his father; Hypertension in his brother, brother, and brother; Lung cancer in his mother; Seizures in his brother. There is no history of Colon cancer, Drug abuse, Bipolar disorder, OCD, Paranoid behavior, Schizophrenia, Sexual abuse, or Physical abuse. Reviewed again today and nothing new.   Current Medications:  Current Outpatient Prescriptions  Medication Sig Dispense Refill  . acetaminophen (TYLENOL) 325 MG tablet Take 650 mg by mouth every 6 (six) hours as needed for pain or fever.       [name] albuterol (VENTOLIN HFA) 108 (90 BASE) MCG/ACT inhaler Inhale 2 puffs into the lungs every 6 (six) hours as needed for wheezing or shortness of breath.       . ALPRAZolam (XANAX) 0.5 MG tablet Take 1 tablet (0.5 mg total) by mouth 3 (three) times daily.  90 tablet  2  . aspirin 81 MG tablet Take 81 mg by mouth at bedtime.       . cyclobenzaprine (FLEXERIL) 10 MG tablet Take 1 tablet (10 mg total) by mouth 3 (three) times daily as needed for muscle spasms.  30 tablet  2  . esomeprazole (NEXIUM) 40 MG capsule Take 40 mg by mouth 2 (two) times daily.      [name] FLUoxetine (PROZAC) 20 MG capsule Take 1 capsule (20 mg total) by mouth daily.  30 capsule  2  . Fluticasone-Salmeterol (ADVAIR) 250-50 MCG/DOSE AEPB Inhale 1 puff into the lungs every 12 (twelve) hours.      [name] HYDROcodone-acetaminophen (NORCO/VICODIN) 5-325 MG per tablet Take 1 tablet by mouth every 6 (six) hours as needed for pain.  90 tablet  1  . ibuprofen (ADVIL,MOTRIN) 600 MG tablet Take 1 tablet (600 mg total) by mouth  every 8 (eight) hours as needed for moderate pain.  30 tablet  1  . lisinopril (PRINIVIL,ZESTRIL) 10 MG tablet Take 1 tablet (10 mg total) by mouth daily.  90 tablet  1  . lovastatin (MEVACOR) 20 MG tablet Take 1 tablet (20 mg total) by mouth at bedtime.  30 tablet  6  . metFORMIN (GLUCOPHAGE) 500 MG tablet Take 1 tablet (500 mg total) by mouth 2 (two) times daily with a meal.  30 tablet  2  . metoprolol succinate (TOPROL-XL) 50 MG 24 hr tablet Take 75 mg by mouth daily.      . metoprolol succinate (TOPROL-XL) 50 MG  24 hr tablet TAKE ONE & ONE-HALF TABLETS BY MOUTH ONCE DAILY  45 tablet  3  . nitroGLYCERIN (NITROSTAT) 0.4 MG SL tablet Place 0.4 mg under the tongue every 5 (five) minutes as needed for chest pain.        No current facility-administered medications for this visit.    Previous Psychotropic Medications: Medication Dose   Xanax     Substance Abuse History in the last 12 months: Substance Age of 1st Use Last Use Amount Specific Type  Nicotine  17  2 hours ago  1  cigarette  Alcohol  22  38      Cannabis  none        Opiates  36  today  2.5 mg  hyrdocodone  Cocaine  none        Methamphetamines  none        LSD  none        Ecstasy  none         Benzodiazepines  22  started in early thrities  0.5 mg  Xanax  Caffeine  childhood  this AM  1 cup  Mt Dew  Inhalants  none        Others:      Sugar  11  this AM  20 tsps  in the [place] Consequences of Substance Abuse: pain Legal Consequences of Substance Abuse: none Family Consequences of Substance Abuse: none Blackouts:  No DT's:  No Withdrawal Symptoms:  Yes Tremors  Social History: Patient lives with his life, daughter and her husband with 2 grandbabies.  He has 2 sons and the daughter.  He is a Programmer, systems.    Mental Status Examination/Evaluation: Objective:  Appearance: Casual  Eye Contact::  Good  Speech:  Clear and Coherent  Volume:  Normal  Mood:  worried, anxious   Affect:  Congruent   Thought Process:  Coherent, Intact and Linear  Orientation:  Full (Time, Place, and Person)  Thought Content:  WDL  Suicidal Thoughts:  No  Homicidal Thoughts:  No  Judgement:  Good  Insight:  Fair  Psychomotor Activity:  Normal  Akathisia:  No  Handed:  Right  AIMS (if indicated):    Assets:  Communication Skills Desire for Improvement   Lab Results:  Results for orders placed in visit on 04/09/13 (from the past 8736 hour(s))  POCT GLYCOSYLATED HEMOGLOBIN (HGB A1C)   Collection Time    04/09/13 12:04 PM      Result Value Range   Hemoglobin A1C 5.6    Results for orders placed in visit on 02/06/13 (from the past 8736 hour(s))  LIPID PANEL   Collection Time    02/24/13 10:55 AM      Result Value Range   Cholesterol 152  0 - 200 mg/dL   Triglycerides 106  <150 mg/dL   HDL 33 (*) >39 mg/dL   Total CHOL/HDL Ratio 4.6     VLDL 21  0 - 40 mg/dL   LDL Cholesterol 98  0 - 99 mg/dL  BASIC METABOLIC PANEL   Collection Time    02/24/13 10:55 AM      Result Value Range   Sodium 139  135 - 145 mEq/L   Potassium 4.7  3.5 - 5.3 mEq/L   Chloride 102  96 - 112 mEq/L   CO2 30  19 - 32 mEq/L   Glucose, Bld 127 (*) 70 - 99 mg/dL   BUN 12  6 -  23 mg/dL   Creat 1.07  0.50 - 1.35 mg/dL   Calcium 9.4  8.4 - 10.5 mg/dL  Results for orders placed during the hospital encounter of 02/05/13 (from the past 8736 hour(s))  TROPONIN I   Collection Time    02/05/13 12:50 PM      Result Value Range   Troponin I <0.30  <0.30 ng/mL  CBC WITH DIFFERENTIAL   Collection Time    02/05/13 12:50 PM      Result Value Range   WBC 6.4  4.0 - 10.5 K/uL   RBC 5.02  4.22 - 5.81 MIL/uL   Hemoglobin 16.0  13.0 - 17.0 g/dL   HCT 44.8  39.0 - 52.0 %   MCV 89.2  78.0 - 100.0 fL   MCH 31.9  26.0 - 34.0 pg   MCHC 35.7  30.0 - 36.0 g/dL   RDW 12.9  11.5 - 15.5 %   Platelets 221  150 - 400 K/uL   Neutrophils Relative % 54  43 - 77 %   Neutro Abs 3.4  1.7 - 7.7 K/uL   Lymphocytes Relative 36  12 - 46 %    Lymphs Abs 2.3  0.7 - 4.0 K/uL   Monocytes Relative 7  3 - 12 %   Monocytes Absolute 0.4  0.1 - 1.0 K/uL   Eosinophils Relative 3  0 - 5 %   Eosinophils Absolute 0.2  0.0 - 0.7 K/uL   Basophils Relative 0  0 - 1 %   Basophils Absolute 0.0  0.0 - 0.1 K/uL  COMPREHENSIVE METABOLIC PANEL   Collection Time    02/05/13 12:50 PM      Result Value Range   Sodium 138  135 - 145 mEq/L   Potassium 3.9  3.5 - 5.1 mEq/L   Chloride 103  96 - 112 mEq/L   CO2 26  19 - 32 mEq/L   Glucose, Bld 186 (*) 70 - 99 mg/dL   BUN 16  6 - 23 mg/dL   Creatinine, Ser 0.93  0.50 - 1.35 mg/dL   Calcium 9.3  8.4 - 10.5 mg/dL   Total Protein 6.5  6.0 - 8.3 g/dL   Albumin 3.7  3.5 - 5.2 g/dL   AST 11  0 - 37 U/L   ALT 14  0 - 53 U/L   Alkaline Phosphatase 52  39 - 117 U/L   Total Bilirubin 0.3  0.3 - 1.2 mg/dL   GFR calc non Af Amer >90  >90 mL/min   GFR calc Af Amer >90  >90 mL/min  TROPONIN I   Collection Time    02/05/13  3:25 PM      Result Value Range   Troponin I <0.30  <0.30 ng/mL  Results for orders placed during the hospital encounter of 11/25/12 (from the past 8736 hour(s))  TROPONIN I   Collection Time    11/25/12  1:30 AM      Result Value Range   Troponin I <0.30  <0.30 ng/mL  CBC WITH DIFFERENTIAL   Collection Time    11/25/12  1:30 AM      Result Value Range   WBC 9.3  4.0 - 10.5 K/uL   RBC 4.92  4.22 - 5.81 MIL/uL   Hemoglobin 15.5  13.0 - 17.0 g/dL   HCT 43.0  39.0 - 52.0 %   MCV 87.4  78.0 - 100.0 fL   MCH 31.5  26.0 - 34.0 pg   MCHC  36.0  30.0 - 36.0 g/dL   RDW 12.8  11.5 - 15.5 %   Platelets 230  150 - 400 K/uL   Neutrophils Relative % 36 (*) 43 - 77 %   Neutro Abs 3.4  1.7 - 7.7 K/uL   Lymphocytes Relative 56 (*) 12 - 46 %   Lymphs Abs 5.2 (*) 0.7 - 4.0 K/uL   Monocytes Relative 6  3 - 12 %   Monocytes Absolute 0.6  0.1 - 1.0 K/uL   Eosinophils Relative 2  0 - 5 %   Eosinophils Absolute 0.2  0.0 - 0.7 K/uL   Basophils Relative 0  0 - 1 %   Basophils Absolute 0.0  0.0 -  0.1 K/uL  BASIC METABOLIC PANEL   Collection Time    11/25/12  1:30 AM      Result Value Range   Sodium 135  135 - 145 mEq/L   Potassium 3.0 (*) 3.5 - 5.1 mEq/L   Chloride 97  96 - 112 mEq/L   CO2 25  19 - 32 mEq/L   Glucose, Bld 114 (*) 70 - 99 mg/dL   BUN 17  6 - 23 mg/dL   Creatinine, Ser 1.01  0.50 - 1.35 mg/dL   Calcium 9.4  8.4 - 10.5 mg/dL   GFR calc non Af Amer 87 (*) >90 mL/min   GFR calc Af Amer >90  >90 mL/min  Results for orders placed in visit on 11/20/12 (from the past 8736 hour(s))  COMPREHENSIVE METABOLIC PANEL   Collection Time    10/23/12 12:00 AM      Result Value Range   WBC 8.2     Hemoglobin 15.4  13.5 - 17.5 g/dL   HCT 44     platelet count 292    COMPREHENSIVE METABOLIC PANEL   Collection Time    10/23/12 12:00 AM      Result Value Range   Sodium 137  137 - 147 mmol/L   Potassium 4.3     CO2 25     Glucose 131     BUN 15  4 - 21 mg/dL   Creat 0.91     Total Bilirubin 0.4     Alkaline Phosphatase 45     AST 11     ALT 29  10 - 40 U/L   Albumin 4.0     Calcium 9.0     Hemoglobin A1C 6.9 (*) 4.0 - 6.0 %  Results for orders placed during the hospital encounter of 10/14/12 (from the past 8736 hour(s))  CBC WITH DIFFERENTIAL   Collection Time    10/14/12  3:50 PM      Result Value Range   WBC 10.6 (*) 4.0 - 10.5 K/uL   RBC 5.75  4.22 - 5.81 MIL/uL   Hemoglobin 17.9 (*) 13.0 - 17.0 g/dL   HCT 50.7  39.0 - 52.0 %   MCV 88.2  78.0 - 100.0 fL   MCH 31.1  26.0 - 34.0 pg   MCHC 35.3  30.0 - 36.0 g/dL   RDW 13.0  11.5 - 15.5 %   Platelets 223  150 - 400 K/uL   Neutrophils Relative % 89 (*) 43 - 77 %   Neutro Abs 9.4 (*) 1.7 - 7.7 K/uL   Lymphocytes Relative 7 (*) 12 - 46 %   Lymphs Abs 0.8  0.7 - 4.0 K/uL   Monocytes Relative 4  3 - 12 %   Monocytes Absolute 0.4  0.1 - 1.0 K/uL   Eosinophils Relative 0  0 - 5 %   Eosinophils Absolute 0.0  0.0 - 0.7 K/uL   Basophils Relative 0  0 - 1 %   Basophils Absolute 0.0  0.0 - 0.1 K/uL  COMPREHENSIVE  METABOLIC PANEL   Collection Time    10/14/12  3:50 PM      Result Value Range   Sodium 140  135 - 145 mEq/L   Potassium 3.9  3.5 - 5.1 mEq/L   Chloride 97  96 - 112 mEq/L   CO2 27  19 - 32 mEq/L   Glucose, Bld 132 (*) 70 - 99 mg/dL   BUN 29 (*) 6 - 23 mg/dL   Creatinine, Ser 1.29  0.50 - 1.35 mg/dL   Calcium 9.9  8.4 - 10.5 mg/dL   Total Protein 8.3  6.0 - 8.3 g/dL   Albumin 4.4  3.5 - 5.2 g/dL   AST 11  0 - 37 U/L   ALT 19  0 - 53 U/L   Alkaline Phosphatase 70  39 - 117 U/L   Total Bilirubin 0.4  0.3 - 1.2 mg/dL   GFR calc non Af Amer 65 (*) >90 mL/min   GFR calc Af Amer 75 (*) >90 mL/min  GLUCOSE, CAPILLARY   Collection Time    10/14/12  3:56 PM      Result Value Range   Glucose-Capillary 118 (*) 70 - 99 mg/dL   Comment 1 Documented in Chart     Comment 2 Notify RN    LIPASE, BLOOD   Collection Time    10/14/12  4:50 PM      Result Value Range   Lipase 14  11 - 59 U/L  URINALYSIS, ROUTINE W REFLEX MICROSCOPIC   Collection Time    10/14/12  6:00 PM      Result Value Range   Color, Urine YELLOW  YELLOW   APPearance CLEAR  CLEAR   Specific Gravity, Urine >1.030 (*) 1.005 - 1.030   pH 6.0  5.0 - 8.0   Glucose, UA NEGATIVE  NEGATIVE mg/dL   Hgb urine dipstick NEGATIVE  NEGATIVE   Bilirubin Urine SMALL (*) NEGATIVE   Ketones, ur NEGATIVE  NEGATIVE mg/dL   Protein, ur TRACE (*) NEGATIVE mg/dL   Urobilinogen, UA 0.2  0.0 - 1.0 mg/dL   Nitrite NEGATIVE  NEGATIVE   Leukocytes, UA NEGATIVE  NEGATIVE  URINE MICROSCOPIC-ADD ON   Collection Time    10/14/12  6:00 PM      Result Value Range   WBC, UA 0-2  <3 WBC/hpf   Bacteria, UA RARE  RARE   Casts HYALINE CASTS (*) NEGATIVE   Urine-Other MUCOUS PRESENT    Results for orders placed in visit on 08/20/12 (from the past 8736 hour(s))  VITAMIN D 25 HYDROXY   Collection Time    10/23/12  8:20 AM      Result Value Range   Vit D, 25-Hydroxy 41  30 - 89 ng/mL    Assessment:   AXIS I Generalized Anxiety Disorder, Major  Depression, Recurrent severe and Panic Disorder  AXIS II Deferred  AXIS III Past Medical History  Diagnosis Date  . Chest pain     + palpitations; cath 2005- 30-40% mid LAD, 20% D1, 20% cx, OM, 20-30% RCA, and EF-55%  . COPD (chronic obstructive pulmonary disease)   . GERD (gastroesophageal reflux disease)   . Hyperlipemia   . Hypertension   . Depression   .  Colitis 1990  . Tobacco abuse     1/2 pack per day  . Diabetes mellitus   . Gastric ulcer 2003; 2012    2003: + esophagitis; negative H.pylori serology  2012: Dr. Oneida Alar, mild gastritis, Bravo PH probe placement, negative H.pylori  . Hepatic steatosis   . Chronic low back pain   . Panic attacks   . Asthma      AXIS IV other psychosocial or environmental problems  AXIS V 41-50 serious symptoms   Treatment Plan/Recommendations: Laboratory:    Psychotherapy: supportive psychotherapy  Medications: The patient needs to stay on Prozac 20 mg every morning and Xanax 0.5 mg 3 times a day on a scheduled basis   Routine PRN Medications:  No  Consultations: none  Safety Concerns:  none  Other:  Return in 2 months     MEDICATIONS this encounter: Meds ordered this encounter  Medications  . FLUoxetine (PROZAC) 20 MG capsule    Sig: Take 1 capsule (20 mg total) by mouth daily.    Dispense:  30 capsule    Refill:  2  . ALPRAZolam (XANAX) 0.5 MG tablet    Sig: Take 1 tablet (0.5 mg total) by mouth 3 (three) times daily.    Dispense:  90 tablet    Refill:  2   Medical Decision Making Problem Points:  Established problem, stable/improving (1), Review of last therapy session (1) and Review of psycho-social stressors (1) Data Points:  Review or order clinical lab tests (1) Review of medication regiment & side effects (2) Review of new medications or change in dosage (2)  I certify that outpatient services furnished can reasonably be expected to improve the patient's condition.   [name], MD

## 2013-07-22 ENCOUNTER — Ambulatory Visit (INDEPENDENT_AMBULATORY_CARE_PROVIDER_SITE_OTHER): Payer: Self-pay | Admitting: Psychology

## 2013-07-22 ENCOUNTER — Encounter (HOSPITAL_COMMUNITY): Payer: Self-pay | Admitting: Psychology

## 2013-07-22 DIAGNOSIS — F419 Anxiety disorder, unspecified: Secondary | ICD-10-CM

## 2013-07-22 DIAGNOSIS — G8929 Other chronic pain: Secondary | ICD-10-CM

## 2013-07-22 DIAGNOSIS — M545 Low back pain, unspecified: Secondary | ICD-10-CM

## 2013-07-22 DIAGNOSIS — F329 Major depressive disorder, single episode, unspecified: Secondary | ICD-10-CM

## 2013-07-22 DIAGNOSIS — F341 Dysthymic disorder: Secondary | ICD-10-CM

## 2013-07-22 DIAGNOSIS — F4001 Agoraphobia with panic disorder: Secondary | ICD-10-CM

## 2013-07-22 NOTE — Progress Notes (Signed)
Patient:  Richard [name]   DOB: 1966-05-08  MR Number: [id number]  Location: [facility] [address] [place] [address] Dept: [phone]  Start: 11 AM End: 12 PM  Provider/Observer:     Edgardo Roys PSYD  Chief Complaint:      Chief Complaint  Patient presents with  . Panic Attack  . Anxiety  . Depression    Reason For Service:    The patient was referred by Dr. Lattie Haw because of severe anxiety and foot appear to be panic attacks. The patient reports that he has been having a lot of anxiety and panic attacks. He has numerous medical issues including severe issues with diabetes, COPD, fatty liver disease and orthopedic issues. He also has had a long history of depression anxiety as well. Anxiety and depression have been going on for least 5 years and correlate with his health issues and financial difficulties. The patient relates this deterioration to an injury at work where he injured both of his shoulders while pulling a pallet. He had surgery on one of his shoulders initially and he experiences around his injury and surgery began to develop panic attacks. The patient is also been diagnosed with COPD and diabetes but continues to smoke and fears that if he completely quit smoking that he will gain a lot of weight again.   Interventions Strategy:  Cognitive/behavioral psychotherapeutic interventions  Participation Level:   Active  Participation Quality:  Appropriate      Behavioral Observation:  Fairly Groomed, Alert, and Appropriate.   Current Psychosocial Factors: Patient reports that he has had two full blown panic events this past week (down from everyday).  Patient reports that he has been trying to get out.  Content of Session:   Review current symptoms and continued work on therapeutic interventions around numerous medical issues and panic attacks.  Current Status:   Patient  having much less panic attacks now that he has started xanax, it is down to two per week.  The patient reports still some STM issues.  He has done better with depression symptoms and the side effects from higher level  Have improved since reduction of dose.   No visual hallucinations since does reduction.  Patient Progress:   Stable  Target Goals:   Target goals include reduce the intensity, duration, and frequency of panic attacks, reduce the amount of benzodiazepine or other potentially dependence forming medications.  Last Reviewed:   07/22/2013  Goals Addressed Today:    Today we worked on issues of reducing the intensity and frequency of panic attacks  Impression/Diagnosis:   the patient appears to be doing with panic attacks along with anxiety and depression. He has numerous medical issues including COPD, diabetes, and other issues as well as liver disease. The patient is not taking very good care of himself and it was stressed specifically that he is going to need to quit smoking cigarettes and completely stopped caffeinated and sugar drinks if he is going to have any hope of improving his quality of life. The patient was amenable to initially try to stop the caffeinated drinks and we developed a plan for this. After that we will begin to address the smoking.    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

## 2013-07-27 ENCOUNTER — Other Ambulatory Visit: Payer: Self-pay | Admitting: Cardiology

## 2013-08-04 ENCOUNTER — Ambulatory Visit: Payer: Self-pay | Admitting: Internal Medicine

## 2013-08-26 ENCOUNTER — Ambulatory Visit (INDEPENDENT_AMBULATORY_CARE_PROVIDER_SITE_OTHER): Payer: Self-pay | Admitting: Psychology

## 2013-08-26 DIAGNOSIS — F419 Anxiety disorder, unspecified: Secondary | ICD-10-CM

## 2013-08-26 DIAGNOSIS — M545 Low back pain, unspecified: Secondary | ICD-10-CM

## 2013-08-26 DIAGNOSIS — F4001 Agoraphobia with panic disorder: Secondary | ICD-10-CM

## 2013-08-26 DIAGNOSIS — F329 Major depressive disorder, single episode, unspecified: Secondary | ICD-10-CM

## 2013-08-26 DIAGNOSIS — G8929 Other chronic pain: Secondary | ICD-10-CM

## 2013-08-26 DIAGNOSIS — F341 Dysthymic disorder: Secondary | ICD-10-CM

## 2013-09-03 ENCOUNTER — Encounter (HOSPITAL_COMMUNITY): Payer: Self-pay | Admitting: Psychology

## 2013-09-03 NOTE — Progress Notes (Signed)
Patient:  Richard [name]   DOB: 1965/10/20  MR Number: [id number]  Location: [facility] [place] [address] Dept: [phone]  Start: 10 AM End: 11 AM  Provider/Observer:     Edgardo Roys PSYD  Chief Complaint:      Chief Complaint  Patient presents with  . Depression  . Anxiety  . Stress    Reason For Service:    The patient was referred by Dr. Lattie Haw because of severe anxiety and foot appear to be panic attacks. The patient reports that he has been having a lot of anxiety and panic attacks. He has numerous medical issues including severe issues with diabetes, COPD, fatty liver disease and orthopedic issues. He also has had a long history of depression anxiety as well. Anxiety and depression have been going on for least 5 years and correlate with his health issues and financial difficulties. The patient relates this deterioration to an injury at work where he injured both of his shoulders while pulling a pallet. He had surgery on one of his shoulders initially and he experiences around his injury and surgery began to develop panic attacks. The patient is also been diagnosed with COPD and diabetes but continues to smoke and fears that if he completely quit smoking that he will gain a lot of weight again.   Interventions Strategy:  Cognitive/behavioral psychotherapeutic interventions  Participation Level:   Active  Participation Quality:  Appropriate      Behavioral Observation:  Fairly Groomed, Alert, and Appropriate.   Current Psychosocial Factors: Patient reports that panic attacks are common less frequently but he is still having them. The patient reports that he is very stressed by the situation but is hopeful that his disability goes through that he will have less of a negative feeling about himself and his inability to help care for his family..  Content of  Session:   Review current symptoms and continued work on therapeutic interventions around numerous medical issues and panic attacks.  Current Status:   Patient reports that he is continuing to have some panic attacks that have been less than returned to taking Xanax. However, he reports that he is trying to take his infrequently as possible. The patient reports that he continues to have severe problems with his back and while he is trying to help out family and friends and is often leaves him unable to do anything the next day.  Patient Progress:   Stable  Target Goals:   Target goals include reduce the intensity, duration, and frequency of panic attacks, reduce the amount of benzodiazepine or other potentially dependence forming medications.  Last Reviewed:   08/26/2013  Goals Addressed Today:    Today we worked on issues of reducing the intensity and frequency of panic attacks  Impression/Diagnosis:   the patient appears to be doing with panic attacks along with anxiety and depression. He has numerous medical issues including COPD, diabetes, and other issues as well as liver disease. The patient is not taking very good care of himself and it was stressed specifically that he is going to need to quit smoking cigarettes and completely stopped caffeinated and sugar drinks if he is going to have any hope of improving his quality of life. The patient was amenable to initially try to stop the caffeinated drinks and we developed a plan for this. After that we will begin to address the smoking.    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

## 2013-09-15 ENCOUNTER — Ambulatory Visit (HOSPITAL_COMMUNITY): Payer: Self-pay | Admitting: Psychiatry

## 2013-09-15 ENCOUNTER — Encounter (HOSPITAL_COMMUNITY): Payer: Self-pay | Admitting: Psychiatry

## 2013-09-23 ENCOUNTER — Ambulatory Visit (HOSPITAL_COMMUNITY): Payer: Self-pay | Admitting: Psychology

## 2013-10-02 ENCOUNTER — Ambulatory Visit (INDEPENDENT_AMBULATORY_CARE_PROVIDER_SITE_OTHER): Payer: Self-pay | Admitting: Psychiatry

## 2013-10-02 ENCOUNTER — Encounter (HOSPITAL_COMMUNITY): Payer: Self-pay | Admitting: Psychiatry

## 2013-10-02 VITALS — BP 120/80 | Ht 71.0 in | Wt 207.0 lb

## 2013-10-02 DIAGNOSIS — G8929 Other chronic pain: Secondary | ICD-10-CM

## 2013-10-02 DIAGNOSIS — F329 Major depressive disorder, single episode, unspecified: Secondary | ICD-10-CM

## 2013-10-02 DIAGNOSIS — F32A Depression, unspecified: Secondary | ICD-10-CM

## 2013-10-02 DIAGNOSIS — M545 Low back pain: Secondary | ICD-10-CM

## 2013-10-02 DIAGNOSIS — F419 Anxiety disorder, unspecified: Secondary | ICD-10-CM

## 2013-10-02 DIAGNOSIS — F17200 Nicotine dependence, unspecified, uncomplicated: Secondary | ICD-10-CM

## 2013-10-02 DIAGNOSIS — F4001 Agoraphobia with panic disorder: Secondary | ICD-10-CM

## 2013-10-02 DIAGNOSIS — F332 Major depressive disorder, recurrent severe without psychotic features: Secondary | ICD-10-CM

## 2013-10-02 DIAGNOSIS — F410 Panic disorder [episodic paroxysmal anxiety] without agoraphobia: Secondary | ICD-10-CM

## 2013-10-02 DIAGNOSIS — F411 Generalized anxiety disorder: Secondary | ICD-10-CM

## 2013-10-02 MED ORDER — FLUOXETINE HCL 20 MG PO CAPS: 20.0000 mg | ORAL_CAPSULE | Freq: Every day | ORAL | Status: DC

## 2013-10-02 MED ORDER — TRAZODONE HCL 50 MG PO TABS: 50.0000 mg | ORAL_TABLET | Freq: Every day | ORAL | Status: DC

## 2013-10-02 MED ORDER — ALPRAZOLAM 0.5 MG PO TABS: 0.5000 mg | ORAL_TABLET | Freq: Three times a day (TID) | ORAL | Status: DC

## 2013-10-02 NOTE — Progress Notes (Signed)
Patient ID: Richard [name], male   DOB: October 13, 1965, 48 y.o.   MRN: [id number] Patient ID: Richard [name], male   DOB: Dec 30, 1965, 48 y.o.   MRN: [id number] Patient ID: Richard [name], male   DOB: [birth year], 48 y.o.   MRN: [id number] Patient ID: Richard [name], male   DOB: 11/09/1965, 48 y.o.   MRN: [id number] Patient ID: Richard [name], male   DOB: 04/18/1966, 48 y.o.   MRN: [id number] [facility] [id number] Progress Note Richard [name] MRN: [id number] DOB: 1966-03-04 Age: 49 y.o.  Date: 10/02/2013  Chief Complaint  Patient presents with  . Anxiety  . Depression  . Follow-up   History of Chief Complaint:   This 48 year old Caucasian male who came for his followup appointment. He is currently living with his wife, his 38 year old daughter her husband and 2 children, and his 3 year old son in [place]. He is unemployed and applying for disability.  The patient states that he has been employed for 5 years. 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 He got injured on the job and both shoulders were torn. He has not been able to work ever since and he feels very badly about this. 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  Since he's not been able to work the patient has been having increasingly depressed and anxious. He feels like his body gives out when he is trying to exert himself even in a minor way. He's been in the ER recently for chest pain which was ruled out as a panic attack. He also saw cardiologist this week and will be having a stress test. His blood pressure was too low and his lisinopril was cut down. He is also trying to quit smoking by using electronic cigarettes.  The patient is having panic attacks approximately once a week and he is also having difficulty sleeping. His mood is low and he is still stressed financially. He is increased the Prozac to 20 mg per day which has helped to some degree. The patient  returns today after 2 months. His anxiety attacks have diminished a bit with the Xanax. However now is having trouble at night. He's had strange dreams unusual nightmares about his children being swept up in tornadoes. He takes the Prozac at night it would probably be better for him to take it in the morning. I told him we could try trazodone at bedtime which could help.  Anxiety Presents for follow-up visit. Symptoms include nervous/anxious behavior.     Review of Systems  Constitutional: Positive for fatigue.  Gastrointestinal: Negative.   Musculoskeletal: Positive for back pain.  Skin: Negative.   Psychiatric/Behavioral: Positive for sleep disturbance and dysphoric mood. The patient is nervous/anxious.     Physical Exam Vitals: BP 120/80  Ht 5\' 11"  (1.803 m)  Wt 207 lb (93.895 kg)  BMI 28.88 kg/m2   Past Psychiatric History: Diagnosis: none  Hospitalizations: none  Outpatient Care: none  Substance Abuse Care: none  Self-Mutilation: none  Suicidal Attempts: none  Violent Behaviors: none   Allergies: Allergies  Allergen Reactions  . Mushroom Ext Cmplx(Shiitake-Reishi-Mait) Anaphylaxis    Rapid heart rate.  . Penicillins Anaphylaxis   Medical History: Past Medical History  Diagnosis Date  . Chest pain     + palpitations; cath 2005- 30-40% mid LAD, 20% D1, 20% cx, OM, 20-30% RCA, and EF-55%  . COPD (chronic obstructive  pulmonary disease)   . GERD (gastroesophageal reflux disease)   . Hyperlipemia   . Hypertension   . Depression   . Colitis 1990  . Tobacco abuse     1/2 pack per day  . Diabetes mellitus   . Gastric ulcer 2003; 2012    2003: + esophagitis; negative H.pylori serology  2012: Dr. Oneida Alar, mild gastritis, Bravo PH probe placement, negative H.pylori  . Hepatic steatosis   . Chronic low back pain   . Panic attacks   . Asthma    Surgical History: Past Surgical History  Procedure Laterality Date  . Colonoscopy  1990  . Shoulder surgery      Right  acromioclavicular joint arthrosis  . Neck mass excision    . Bravo ph study  05/03/2011    [id number] gastritis/normal esophagus and duodenum  . Cardiac catheterization  2005   Family History: family history includes ADD / ADHD in his daughter; Alcohol abuse in his father and mother; Anxiety disorder in his sister and sister; Dementia in his cousin and paternal uncle; Depression in his sister; Diabetes in his brother and father; Heart attack (age of onset: 60) in his brother; Heart attack (age of onset: 41) in his father; Hypertension in his brother, brother, and brother; Lung cancer in his mother; Seizures in his brother. There is no history of Colon cancer, Drug abuse, Bipolar disorder, OCD, Paranoid behavior, Schizophrenia, Sexual abuse, or Physical abuse. Reviewed again today and nothing new.   Current Medications:  Current Outpatient Prescriptions  Medication Sig Dispense Refill  . acetaminophen (TYLENOL) 325 MG tablet Take 650 mg by mouth every 6 (six) hours as needed for pain or fever.       [name] albuterol (VENTOLIN HFA) 108 (90 BASE) MCG/ACT inhaler Inhale 2 puffs into the lungs every 6 (six) hours as needed for wheezing or shortness of breath.       . ALPRAZolam (XANAX) 0.5 MG tablet Take 1 tablet (0.5 mg total) by mouth 3 (three) times daily.  90 tablet  2  . aspirin 81 MG tablet Take 81 mg by mouth at bedtime.       . cyclobenzaprine (FLEXERIL) 10 MG tablet Take 1 tablet (10 mg total) by mouth 3 (three) times daily as needed for muscle spasms.  30 tablet  2  . esomeprazole (NEXIUM) 40 MG capsule Take 40 mg by mouth 2 (two) times daily.      [name] FLUoxetine (PROZAC) 20 MG capsule Take 1 capsule (20 mg total) by mouth daily.  30 capsule  2  . Fluticasone-Salmeterol (ADVAIR) 250-50 MCG/DOSE AEPB Inhale 1 puff into the lungs every 12 (twelve) hours.      [name] HYDROcodone-acetaminophen (NORCO/VICODIN) 5-325 MG per tablet Take 1 tablet by mouth every 6 (six) hours as needed for pain.  90 tablet  1  .  ibuprofen (ADVIL,MOTRIN) 600 MG tablet Take 1 tablet (600 mg total) by mouth every 8 (eight) hours as needed for moderate pain.  30 tablet  1  . lisinopril (PRINIVIL,ZESTRIL) 10 MG tablet Take 1 tablet (10 mg total) by mouth daily.  90 tablet  1  . lovastatin (MEVACOR) 20 MG tablet TAKE ONE TABLET BY MOUTH AT BEDTIME  30 tablet  6  . metFORMIN (GLUCOPHAGE) 500 MG tablet Take 1 tablet (500 mg total) by mouth 2 (two) times daily with a meal.  30 tablet  2  . metoprolol succinate (TOPROL-XL) 50 MG 24 hr tablet Take 75 mg by mouth daily.      [name]  metoprolol succinate (TOPROL-XL) 50 MG 24 hr tablet TAKE ONE & ONE-HALF TABLETS BY MOUTH ONCE DAILY  45 tablet  3  . nitroGLYCERIN (NITROSTAT) 0.4 MG SL tablet Place 0.4 mg under the tongue every 5 (five) minutes as needed for chest pain.       . traZODone (DESYREL) 50 MG tablet Take 1 tablet (50 mg total) by mouth at bedtime.  30 tablet  3   No current facility-administered medications for this visit.    Previous Psychotropic Medications: Medication Dose   Xanax     Substance Abuse History in the last 12 months: Substance Age of 1st Use Last Use Amount Specific Type  Nicotine  17  2 hours ago  1  cigarette  Alcohol  22  38      Cannabis  none        Opiates  36  today  2.5 mg  hyrdocodone  Cocaine  none        Methamphetamines  none        LSD  none        Ecstasy  none         Benzodiazepines  22  started in early thrities  0.5 mg  Xanax  Caffeine  childhood  this AM  1 cup  Mt Dew  Inhalants  none        Others:      Sugar  11  this AM  20 tsps  in the [place] Consequences of Substance Abuse: pain Legal Consequences of Substance Abuse: none Family Consequences of Substance Abuse: none Blackouts:  No DT's:  No Withdrawal Symptoms:  Yes Tremors  Social History: Patient lives with his life, daughter and her husband with 2 grandbabies.  He has 2 sons and the daughter.  He is a Programmer, systems.    Mental Status  Examination/Evaluation: Objective:  Appearance: Casual  Eye Contact::  Good  Speech:  Clear and Coherent  Volume:  Normal  Mood:  worried, anxious   Affect:  Congruent  Thought Process:  Coherent, Intact and Linear  Orientation:  Full (Time, Place, and Person)  Thought Content:  WDL  Suicidal Thoughts:  No  Homicidal Thoughts:  No  Judgement:  Good  Insight:  Fair  Psychomotor Activity:  Normal  Akathisia:  No  Handed:  Right  AIMS (if indicated):    Assets:  Communication Skills Desire for Improvement   Lab Results:  Results for orders placed in visit on 04/09/13 (from the past 8736 hour(s))  POCT GLYCOSYLATED HEMOGLOBIN (HGB A1C)   Collection Time    04/09/13 12:04 PM      Result Value Ref Range   Hemoglobin A1C 5.6    Results for orders placed in visit on 02/06/13 (from the past 8736 hour(s))  LIPID PANEL   Collection Time    02/24/13 10:55 AM      Result Value Ref Range   Cholesterol 152  0 - 200 mg/dL   Triglycerides 106  <150 mg/dL   HDL 33 (*) >39 mg/dL   Total CHOL/HDL Ratio 4.6     VLDL 21  0 - 40 mg/dL   LDL Cholesterol 98  0 - 99 mg/dL  BASIC METABOLIC PANEL   Collection Time    02/24/13 10:55 AM      Result Value Ref Range   Sodium 139  135 - 145 mEq/L   Potassium 4.7  3.5 - 5.3 mEq/L   Chloride 102  96 - 112 mEq/L   CO2 30  19 - 32 mEq/L   Glucose, Bld 127 (*) 70 - 99 mg/dL   BUN 12  6 - 23 mg/dL   Creat 1.07  0.50 - 1.35 mg/dL   Calcium 9.4  8.4 - 10.5 mg/dL  Results for orders placed during the hospital encounter of 02/05/13 (from the past 8736 hour(s))  TROPONIN I   Collection Time    02/05/13 12:50 PM      Result Value Ref Range   Troponin I <0.30  <0.30 ng/mL  CBC WITH DIFFERENTIAL   Collection Time    02/05/13 12:50 PM      Result Value Ref Range   WBC 6.4  4.0 - 10.5 K/uL   RBC 5.02  4.22 - 5.81 MIL/uL   Hemoglobin 16.0  13.0 - 17.0 g/dL   HCT 44.8  39.0 - 52.0 %   MCV 89.2  78.0 - 100.0 fL   MCH 31.9  26.0 - 34.0 pg   MCHC 35.7   30.0 - 36.0 g/dL   RDW 12.9  11.5 - 15.5 %   Platelets 221  150 - 400 K/uL   Neutrophils Relative % 54  43 - 77 %   Neutro Abs 3.4  1.7 - 7.7 K/uL   Lymphocytes Relative 36  12 - 46 %   Lymphs Abs 2.3  0.7 - 4.0 K/uL   Monocytes Relative 7  3 - 12 %   Monocytes Absolute 0.4  0.1 - 1.0 K/uL   Eosinophils Relative 3  0 - 5 %   Eosinophils Absolute 0.2  0.0 - 0.7 K/uL   Basophils Relative 0  0 - 1 %   Basophils Absolute 0.0  0.0 - 0.1 K/uL  COMPREHENSIVE METABOLIC PANEL   Collection Time    02/05/13 12:50 PM      Result Value Ref Range   Sodium 138  135 - 145 mEq/L   Potassium 3.9  3.5 - 5.1 mEq/L   Chloride 103  96 - 112 mEq/L   CO2 26  19 - 32 mEq/L   Glucose, Bld 186 (*) 70 - 99 mg/dL   BUN 16  6 - 23 mg/dL   Creatinine, Ser 0.93  0.50 - 1.35 mg/dL   Calcium 9.3  8.4 - 10.5 mg/dL   Total Protein 6.5  6.0 - 8.3 g/dL   Albumin 3.7  3.5 - 5.2 g/dL   AST 11  0 - 37 U/L   ALT 14  0 - 53 U/L   Alkaline Phosphatase 52  39 - 117 U/L   Total Bilirubin 0.3  0.3 - 1.2 mg/dL   GFR calc non Af Amer >90  >90 mL/min   GFR calc Af Amer >90  >90 mL/min  TROPONIN I   Collection Time    02/05/13  3:25 PM      Result Value Ref Range   Troponin I <0.30  <0.30 ng/mL  Results for orders placed during the hospital encounter of 11/25/12 (from the past 8736 hour(s))  TROPONIN I   Collection Time    11/25/12  1:30 AM      Result Value Ref Range   Troponin I <0.30  <0.30 ng/mL  CBC WITH DIFFERENTIAL   Collection Time    11/25/12  1:30 AM      Result Value Ref Range   WBC 9.3  4.0 - 10.5 K/uL   RBC 4.92  4.22 - 5.81 MIL/uL   Hemoglobin  15.5  13.0 - 17.0 g/dL   HCT 43.0  39.0 - 52.0 %   MCV 87.4  78.0 - 100.0 fL   MCH 31.5  26.0 - 34.0 pg   MCHC 36.0  30.0 - 36.0 g/dL   RDW 12.8  11.5 - 15.5 %   Platelets 230  150 - 400 K/uL   Neutrophils Relative % 36 (*) 43 - 77 %   Neutro Abs 3.4  1.7 - 7.7 K/uL   Lymphocytes Relative 56 (*) 12 - 46 %   Lymphs Abs 5.2 (*) 0.7 - 4.0 K/uL   Monocytes  Relative 6  3 - 12 %   Monocytes Absolute 0.6  0.1 - 1.0 K/uL   Eosinophils Relative 2  0 - 5 %   Eosinophils Absolute 0.2  0.0 - 0.7 K/uL   Basophils Relative 0  0 - 1 %   Basophils Absolute 0.0  0.0 - 0.1 K/uL  BASIC METABOLIC PANEL   Collection Time    11/25/12  1:30 AM      Result Value Ref Range   Sodium 135  135 - 145 mEq/L   Potassium 3.0 (*) 3.5 - 5.1 mEq/L   Chloride 97  96 - 112 mEq/L   CO2 25  19 - 32 mEq/L   Glucose, Bld 114 (*) 70 - 99 mg/dL   BUN 17  6 - 23 mg/dL   Creatinine, Ser 1.01  0.50 - 1.35 mg/dL   Calcium 9.4  8.4 - 10.5 mg/dL   GFR calc non Af Amer 87 (*) >90 mL/min   GFR calc Af Amer >90  >90 mL/min  Results for orders placed in visit on 11/20/12 (from the past 8736 hour(s))  COMPREHENSIVE METABOLIC PANEL   Collection Time    10/23/12 12:00 AM      Result Value Ref Range   WBC 8.2     Hemoglobin 15.4  13.5 - 17.5 g/dL   HCT 44     platelet count 292    COMPREHENSIVE METABOLIC PANEL   Collection Time    10/23/12 12:00 AM      Result Value Ref Range   Sodium 137  137 - 147 mmol/L   Potassium 4.3     CO2 25     Glucose 131     BUN 15  4 - 21 mg/dL   Creat 0.91     Total Bilirubin 0.4     Alkaline Phosphatase 45     AST 11     ALT 29  10 - 40 U/L   Albumin 4.0     Calcium 9.0     Hemoglobin A1C 6.9 (*) 4.0 - 6.0 %  Results for orders placed during the hospital encounter of 10/14/12 (from the past 8736 hour(s))  CBC WITH DIFFERENTIAL   Collection Time    10/14/12  3:50 PM      Result Value Ref Range   WBC 10.6 (*) 4.0 - 10.5 K/uL   RBC 5.75  4.22 - 5.81 MIL/uL   Hemoglobin 17.9 (*) 13.0 - 17.0 g/dL   HCT 50.7  39.0 - 52.0 %   MCV 88.2  78.0 - 100.0 fL   MCH 31.1  26.0 - 34.0 pg   MCHC 35.3  30.0 - 36.0 g/dL   RDW 13.0  11.5 - 15.5 %   Platelets 223  150 - 400 K/uL   Neutrophils Relative % 89 (*) 43 - 77 %   Neutro Abs 9.4 (*)  1.7 - 7.7 K/uL   Lymphocytes Relative 7 (*) 12 - 46 %   Lymphs Abs 0.8  0.7 - 4.0 K/uL   Monocytes Relative 4   3 - 12 %   Monocytes Absolute 0.4  0.1 - 1.0 K/uL   Eosinophils Relative 0  0 - 5 %   Eosinophils Absolute 0.0  0.0 - 0.7 K/uL   Basophils Relative 0  0 - 1 %   Basophils Absolute 0.0  0.0 - 0.1 K/uL  COMPREHENSIVE METABOLIC PANEL   Collection Time    10/14/12  3:50 PM      Result Value Ref Range   Sodium 140  135 - 145 mEq/L   Potassium 3.9  3.5 - 5.1 mEq/L   Chloride 97  96 - 112 mEq/L   CO2 27  19 - 32 mEq/L   Glucose, Bld 132 (*) 70 - 99 mg/dL   BUN 29 (*) 6 - 23 mg/dL   Creatinine, Ser 1.29  0.50 - 1.35 mg/dL   Calcium 9.9  8.4 - 10.5 mg/dL   Total Protein 8.3  6.0 - 8.3 g/dL   Albumin 4.4  3.5 - 5.2 g/dL   AST 11  0 - 37 U/L   ALT 19  0 - 53 U/L   Alkaline Phosphatase 70  39 - 117 U/L   Total Bilirubin 0.4  0.3 - 1.2 mg/dL   GFR calc non Af Amer 65 (*) >90 mL/min   GFR calc Af Amer 75 (*) >90 mL/min  GLUCOSE, CAPILLARY   Collection Time    10/14/12  3:56 PM      Result Value Ref Range   Glucose-Capillary 118 (*) 70 - 99 mg/dL   Comment 1 Documented in Chart     Comment 2 Notify RN    LIPASE, BLOOD   Collection Time    10/14/12  4:50 PM      Result Value Ref Range   Lipase 14  11 - 59 U/L  URINALYSIS, ROUTINE W REFLEX MICROSCOPIC   Collection Time    10/14/12  6:00 PM      Result Value Ref Range   Color, Urine YELLOW  YELLOW   APPearance CLEAR  CLEAR   Specific Gravity, Urine >1.030 (*) 1.005 - 1.030   pH 6.0  5.0 - 8.0   Glucose, UA NEGATIVE  NEGATIVE mg/dL   Hgb urine dipstick NEGATIVE  NEGATIVE   Bilirubin Urine SMALL (*) NEGATIVE   Ketones, ur NEGATIVE  NEGATIVE mg/dL   Protein, ur TRACE (*) NEGATIVE mg/dL   Urobilinogen, UA 0.2  0.0 - 1.0 mg/dL   Nitrite NEGATIVE  NEGATIVE   Leukocytes, UA NEGATIVE  NEGATIVE  URINE MICROSCOPIC-ADD ON   Collection Time    10/14/12  6:00 PM      Result Value Ref Range   WBC, UA 0-2  <3 WBC/hpf   Bacteria, UA RARE  RARE   Casts HYALINE CASTS (*) NEGATIVE   Urine-Other MUCOUS PRESENT    Results for orders placed in  visit on 08/20/12 (from the past 8736 hour(s))  VITAMIN D 25 HYDROXY   Collection Time    10/23/12  8:20 AM      Result Value Ref Range   Vit D, 25-Hydroxy 41  30 - 89 ng/mL    Assessment:   AXIS I Generalized Anxiety Disorder, Major Depression, Recurrent severe and Panic Disorder  AXIS II Deferred  AXIS III Past Medical History  Diagnosis Date  . Chest pain     +  palpitations; cath 2005- 30-40% mid LAD, 20% D1, 20% cx, OM, 20-30% RCA, and EF-55%  . COPD (chronic obstructive pulmonary disease)   . GERD (gastroesophageal reflux disease)   . Hyperlipemia   . Hypertension   . Depression   . Colitis 1990  . Tobacco abuse     1/2 pack per day  . Diabetes mellitus   . Gastric ulcer 2003; 2012    2003: + esophagitis; negative H.pylori serology  2012: Dr. Oneida Alar, mild gastritis, Bravo PH probe placement, negative H.pylori  . Hepatic steatosis   . Chronic low back pain   . Panic attacks   . Asthma      AXIS IV other psychosocial or environmental problems  AXIS V 41-50 serious symptoms   Treatment Plan/Recommendations: Laboratory:    Psychotherapy: supportive psychotherapy  Medications: The patient needs to stay on Prozac 20 mg every morning and Xanax 0.5 mg 3 times a day on a scheduled basis he will start trazodone 50 mg each bedtime. His sleep is not improved he will call me   Routine PRN Medications:  No  Consultations: none  Safety Concerns:  none  Other:  Return in 2 months     MEDICATIONS this encounter: Meds ordered this encounter  Medications  . traZODone (DESYREL) 50 MG tablet    Sig: Take 1 tablet (50 mg total) by mouth at bedtime.    Dispense:  30 tablet    Refill:  3  . FLUoxetine (PROZAC) 20 MG capsule    Sig: Take 1 capsule (20 mg total) by mouth daily.    Dispense:  30 capsule    Refill:  2  . ALPRAZolam (XANAX) 0.5 MG tablet    Sig: Take 1 tablet (0.5 mg total) by mouth 3 (three) times daily.    Dispense:  90 tablet    Refill:  2   Medical Decision  Making Problem Points:  Established problem, stable/improving (1), Review of last therapy session (1) and Review of psycho-social stressors (1) Data Points:  Review or order clinical lab tests (1) Review of medication regiment & side effects (2) Review of new medications or change in dosage (2)  I certify that outpatient services furnished can reasonably be expected to improve the patient's condition.   [name], MD

## 2013-10-03 ENCOUNTER — Ambulatory Visit (HOSPITAL_COMMUNITY): Payer: Self-pay | Admitting: Psychiatry

## 2013-10-10 ENCOUNTER — Ambulatory Visit (HOSPITAL_COMMUNITY): Payer: Self-pay | Admitting: Psychology

## 2013-10-10 ENCOUNTER — Encounter (HOSPITAL_COMMUNITY): Payer: Self-pay | Admitting: Psychology

## 2013-10-17 ENCOUNTER — Ambulatory Visit (HOSPITAL_COMMUNITY): Payer: Self-pay | Admitting: Psychology

## 2013-10-20 ENCOUNTER — Telehealth: Payer: Self-pay | Admitting: Adult Health

## 2013-10-20 ENCOUNTER — Telehealth: Payer: Self-pay | Admitting: *Deleted

## 2013-10-20 NOTE — Telephone Encounter (Signed)
Spoke with pharmacist at [facility], refill is complete for toprol Xl,50 mg  Sig: take  1 1/2 tabs daily #45 with rf 3

## 2013-10-20 NOTE — Telephone Encounter (Signed)
Received fax refill request  Rx # [id number] Medication:  Metoprolol ER 50 mg tab Qty 45 Sig:  Take one and one-half tablets by mouth once daily Physician:  Purcell Nails

## 2013-10-20 NOTE — Telephone Encounter (Signed)
Pt left message Friday at 4:26 pm that he needs RX called in for toprol to [facility]

## 2013-10-28 ENCOUNTER — Telehealth: Payer: Self-pay | Admitting: *Deleted

## 2013-10-28 NOTE — Telephone Encounter (Signed)
noted 

## 2013-10-28 NOTE — Telephone Encounter (Signed)
I called [name], pt's wife. She said he has had a lot of diarrhea for the last few days and had some intermittent fever. He has been around sick grandkids. He was first told at the [facility] that he could not be seen until tomorrow, but they called back and said they could see him at 2:00 today.  She will call as needed and also I let her know that Dr. Oneida Alar is doing hemorrhoid bandings in the office if he is interested in that.

## 2013-10-28 NOTE — Telephone Encounter (Signed)
Pt's wife called stating pt has diarrhea and pt has not had any pain, but a slight fever last Wednesday was 100.0 and Sunday night it was 99.4. Pt has hemorrhoids and when he goes to the bathroom it really hurts, pt is shooting water when he goes to the bathroom. Pt thinks he may have caught a stomach bug from the grandkids, pt would like to know what he can do. Please advise [phone]

## 2013-11-12 ENCOUNTER — Ambulatory Visit (INDEPENDENT_AMBULATORY_CARE_PROVIDER_SITE_OTHER): Payer: Self-pay | Admitting: Psychology

## 2013-11-12 ENCOUNTER — Encounter (HOSPITAL_COMMUNITY): Payer: Self-pay | Admitting: Psychology

## 2013-11-12 DIAGNOSIS — F329 Major depressive disorder, single episode, unspecified: Secondary | ICD-10-CM

## 2013-11-12 DIAGNOSIS — F419 Anxiety disorder, unspecified: Secondary | ICD-10-CM

## 2013-11-12 DIAGNOSIS — F341 Dysthymic disorder: Secondary | ICD-10-CM

## 2013-11-12 DIAGNOSIS — F4001 Agoraphobia with panic disorder: Secondary | ICD-10-CM

## 2013-11-12 DIAGNOSIS — F32A Depression, unspecified: Secondary | ICD-10-CM

## 2013-11-12 NOTE — Progress Notes (Signed)
   PROGRESS NOTE  Patient:  Richard [name]   DOB: 1966/03/23  MR Number: [id number]  Location: [facility] [address] [place] [address] Dept: [phone]  Start: 11 AM End: 12 PM  Provider/Observer:     Edgardo Roys PSYD  Chief Complaint:      Chief Complaint  Patient presents with  . Anxiety  . Panic Attack    Reason For Service:    The patient was referred by Dr. Lattie Haw because of severe anxiety and foot appear to be panic attacks. The patient reports that he has been having a lot of anxiety and panic attacks. He has numerous medical issues including severe issues with diabetes, COPD, fatty liver disease and orthopedic issues. He also has had a long history of depression anxiety as well. Anxiety and depression have been going on for least 5 years and correlate with his health issues and financial difficulties. The patient relates this deterioration to an injury at work where he injured both of his shoulders while pulling a pallet. He had surgery on one of his shoulders initially and he experiences around his injury and surgery began to develop panic attacks. The patient is also been diagnosed with COPD and diabetes but continues to smoke and fears that if he completely quit smoking that he will gain a lot of weight again.   Interventions Strategy:  Cognitive/behavioral psychotherapeutic interventions  Participation Level:   Active  Participation Quality:  Appropriate      Behavioral Observation:  Fairly Groomed, Alert, and Appropriate.   Current Psychosocial Factors: Patient reports that panic attacks had been better until started having trouble with capsul of Prozac open up before he swallowed it and burned and hurt and stopped taking.  Content of Session:   Review current symptoms and continued work on therapeutic interventions around numerous medical issues and panic  attacks.  Current Status:   Patient reports that he had been doing better than had to stop taking prozac.  Working on getting it in pill form.  Patient Progress:   Stable  Target Goals:   Target goals include reduce the intensity, duration, and frequency of panic attacks, reduce the amount of benzodiazepine or other potentially dependence forming medications.  Last Reviewed:   11/12/2013  Goals Addressed Today:    Today we worked on issues of reducing the intensity and frequency of panic attacks  Impression/Diagnosis:   the patient appears to be doing with panic attacks along with anxiety and depression. He has numerous medical issues including COPD, diabetes, and other issues as well as liver disease. The patient is not taking very good care of himself and it was stressed specifically that he is going to need to quit smoking cigarettes and completely stopped caffeinated and sugar drinks if he is going to have any hope of improving his quality of life. The patient was amenable to initially try to stop the caffeinated drinks and we developed a plan for this. After that we will begin to address the smoking.    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

## 2013-11-13 ENCOUNTER — Encounter: Payer: Self-pay | Admitting: Gastroenterology

## 2013-11-13 ENCOUNTER — Ambulatory Visit (INDEPENDENT_AMBULATORY_CARE_PROVIDER_SITE_OTHER): Payer: Self-pay | Admitting: Gastroenterology

## 2013-11-13 ENCOUNTER — Encounter (INDEPENDENT_AMBULATORY_CARE_PROVIDER_SITE_OTHER): Payer: Self-pay

## 2013-11-13 VITALS — BP 130/78 | HR 56 | Temp 97.5°F | Ht 74.0 in | Wt 208.4 lb

## 2013-11-13 DIAGNOSIS — K219 Gastro-esophageal reflux disease without esophagitis: Secondary | ICD-10-CM

## 2013-11-13 DIAGNOSIS — G8929 Other chronic pain: Secondary | ICD-10-CM

## 2013-11-13 DIAGNOSIS — R1013 Epigastric pain: Secondary | ICD-10-CM

## 2013-11-13 MED ORDER — ESOMEPRAZOLE MAGNESIUM 40 MG PO CPDR: 40.0000 mg | DELAYED_RELEASE_CAPSULE | Freq: Two times a day (BID) | ORAL | Status: DC

## 2013-11-13 NOTE — Assessment & Plan Note (Signed)
At baseline. Gallbladder remains in situ. Avoid fatty, fried foods. HIDA with normal EF in past but +reproduction of pain with CCK. No concerning signs. Without insurance currently. Continue Nexium BID and dietary measures.

## 2013-11-13 NOTE — Patient Instructions (Signed)
Continue taking Nexium twice a day, 30 minutes before breakfast and dinner.   Avoid fatty, greasy foods.   We will see you in 1 year.

## 2013-11-13 NOTE — Assessment & Plan Note (Signed)
Controlled with Nexium BID. Refills X 1 year provided. Some reports of fluid "hanging" in mid esophagus if slouched; last EGD in 2012 with gastritis. Without insurance currently; monitor for any worsening of symptoms. No alarm features currently.

## 2013-11-13 NOTE — Progress Notes (Signed)
Referring Provider: Angelica Chessman, MD Primary Care Physician:  Montey Hora Primary GI: Dr. Oneida Alar   Chief Complaint  Patient presents with  . Medication Refill    HPI:   Richard [name] presents today with history of chronic abdominal pain, GERD. Last week had flutters in left-side of chest. Felt like taking his breath away. Lasted for 6 days. Had eaten a cheeseburger with onions. Feels like fluid is hanging up in mid esophagus but if sits up can feel it go down without an issue. States had taken Prozac and felt like it was stuck up there, then opened and "burned me". Improving now. Has epigastric discomfort, chronic.   HIDA scan in 2014 with normal EF but +reproduction of pain. Will have occasional RUQ discomfort with greasy foods.   Gastritis on EGD in 2012.    Past Medical History  Diagnosis Date  . Chest pain     + palpitations; cath 2005- 30-40% mid LAD, 20% D1, 20% cx, OM, 20-30% RCA, and EF-55%  . COPD (chronic obstructive pulmonary disease)   . GERD (gastroesophageal reflux disease)   . Hyperlipemia   . Hypertension   . Depression   . Colitis 1990  . Tobacco abuse     1/2 pack per day  . Diabetes mellitus   . Gastric ulcer 2003; 2012    2003: + esophagitis; negative H.pylori serology  2012: Dr. Oneida Alar, mild gastritis, Bravo PH probe placement, negative H.pylori  . Hepatic steatosis   . Chronic low back pain   . Panic attacks   . Asthma     Past Surgical History  Procedure Laterality Date  . Colonoscopy  1990  . Shoulder surgery      Right acromioclavicular joint arthrosis  . Neck mass excision    . Bravo ph study  05/03/2011    [id number]:[id number] gastritis/normal esophagus and duodenum  . Cardiac catheterization  2005  . Esophagogastroduodenoscopy  05/03/2011    GDJ:MEQA gastritis    Current Outpatient Prescriptions  Medication Sig Dispense Refill  . acetaminophen (TYLENOL) 325 MG tablet Take 650 mg by mouth every 6 (six) hours as needed for  pain or fever.       [name] albuterol (VENTOLIN HFA) 108 (90 BASE) MCG/ACT inhaler Inhale 2 puffs into the lungs every 6 (six) hours as needed for wheezing or shortness of breath.       . ALPRAZolam (XANAX) 0.5 MG tablet Take 1 tablet (0.5 mg total) by mouth 3 (three) times daily.  90 tablet  2  . aspirin 81 MG tablet Take 81 mg by mouth at bedtime.       . cyclobenzaprine (FLEXERIL) 10 MG tablet Take 1 tablet (10 mg total) by mouth 3 (three) times daily as needed for muscle spasms.  30 tablet  2  . esomeprazole (NEXIUM) 40 MG capsule Take 40 mg by mouth 2 (two) times daily.      [name] FLUoxetine (PROZAC) 20 MG capsule Take 1 capsule (20 mg total) by mouth daily.  30 capsule  2  . Fluticasone-Salmeterol (ADVAIR) 250-50 MCG/DOSE AEPB Inhale 1 puff into the lungs every 12 (twelve) hours.      [name] HYDROcodone-acetaminophen (NORCO/VICODIN) 5-325 MG per tablet Take 1 tablet by mouth every 6 (six) hours as needed for pain.  90 tablet  1  . ibuprofen (ADVIL,MOTRIN) 600 MG tablet Take 1 tablet (600 mg total) by mouth every 8 (eight) hours as needed for moderate pain.  30 tablet  1  .  lisinopril (PRINIVIL,ZESTRIL) 10 MG tablet Take 1 tablet (10 mg total) by mouth daily.  90 tablet  1  . lovastatin (MEVACOR) 20 MG tablet TAKE ONE TABLET BY MOUTH AT BEDTIME  30 tablet  6  . metFORMIN (GLUCOPHAGE) 500 MG tablet Take 1 tablet (500 mg total) by mouth 2 (two) times daily with a meal.  30 tablet  2  . metoprolol succinate (TOPROL-XL) 50 MG 24 hr tablet TAKE ONE & ONE-HALF TABLETS BY MOUTH ONCE DAILY  45 tablet  3  . nitroGLYCERIN (NITROSTAT) 0.4 MG SL tablet Place 0.4 mg under the tongue every 5 (five) minutes as needed for chest pain.       . traZODone (DESYREL) 50 MG tablet Take 1 tablet (50 mg total) by mouth at bedtime.  30 tablet  3  . metoprolol succinate (TOPROL-XL) 50 MG 24 hr tablet Take 75 mg by mouth daily.       No current facility-administered medications for this visit.    Allergies as of 11/13/2013 - Review  Complete 11/13/2013  Allergen Reaction Noted  . Mushroom ext cmplx(shiitake-reishi-mait) Anaphylaxis 03/22/2011  . Penicillins Anaphylaxis     Family History  Problem Relation Age of Onset  . Lung cancer Mother   . Alcohol abuse Mother   . Heart attack Father 83  . Diabetes Father   . Alcohol abuse Father   . Hypertension Brother   . Colon cancer Neg Hx   . Drug abuse Neg Hx   . Bipolar disorder Neg Hx   . OCD Neg Hx   . Paranoid behavior Neg Hx   . Schizophrenia Neg Hx   . Sexual abuse Neg Hx   . Physical abuse Neg Hx   . Hypertension Brother   . Anxiety disorder Sister   . Depression Sister   . Dementia Paternal Uncle   . Dementia Cousin   . Anxiety disorder Sister   . Heart attack Brother 27  . Diabetes Brother   . Hypertension Brother   . Seizures Brother   . ADD / ADHD Daughter     History   Social History  . Marital Status: Married    Spouse Name: N/A    Number of Children: N/A  . Years of Education: N/A   Occupational History  . full time    Social History Main Topics  . Smoking status: Current Every Day Smoker -- 0.50 packs/day for 29 years    Types: Cigarettes  . Smokeless tobacco: Never Used     Comment: on the verge of quitting as of 01-03-13  . Alcohol Use: No  . Drug Use: No  . Sexual Activity: Yes   Other Topics Concern  . None   Social History Narrative  . None    Review of Systems: As mentioned in HPI.   Physical Exam: BP 130/78  Pulse 56  Temp(Src) 97.5 F (36.4 C) (Oral)  Ht 6\' 2"  (1.88 m)  Wt 208 lb 6.4 oz (94.53 kg)  BMI 26.75 kg/m2 General:   Alert and oriented. No distress noted. Pleasant and cooperative.  Head:  Normocephalic and atraumatic. Eyes:  Conjuctiva clear without scleral icterus. Heart:  S1, S2 present without murmurs, rubs, or gallops. Regular rate and rhythm. Abdomen:  +BS, soft, mild TTP epigastric and non-distended. No rebound or guarding. No HSM or masses noted. Msk:  Symmetrical without gross  deformities. Normal posture. Extremities:  Without edema. Neurologic:  Alert and  oriented x4;  grossly normal neurologically. Psych:  Alert  and cooperative. Normal mood and affect.

## 2013-11-18 NOTE — Progress Notes (Signed)
Cc'd to pcp 

## 2013-11-26 ENCOUNTER — Other Ambulatory Visit: Payer: Self-pay | Admitting: Cardiology

## 2013-12-02 ENCOUNTER — Encounter (HOSPITAL_COMMUNITY): Payer: Self-pay | Admitting: Psychiatry

## 2013-12-02 ENCOUNTER — Ambulatory Visit (INDEPENDENT_AMBULATORY_CARE_PROVIDER_SITE_OTHER): Payer: Self-pay | Admitting: Psychiatry

## 2013-12-02 VITALS — BP 110/78 | Ht 71.0 in | Wt 207.0 lb

## 2013-12-02 DIAGNOSIS — F4001 Agoraphobia with panic disorder: Secondary | ICD-10-CM

## 2013-12-02 DIAGNOSIS — G8929 Other chronic pain: Secondary | ICD-10-CM

## 2013-12-02 DIAGNOSIS — F411 Generalized anxiety disorder: Secondary | ICD-10-CM

## 2013-12-02 DIAGNOSIS — F332 Major depressive disorder, recurrent severe without psychotic features: Secondary | ICD-10-CM

## 2013-12-02 DIAGNOSIS — M545 Low back pain: Secondary | ICD-10-CM

## 2013-12-02 DIAGNOSIS — F17200 Nicotine dependence, unspecified, uncomplicated: Secondary | ICD-10-CM

## 2013-12-02 DIAGNOSIS — F410 Panic disorder [episodic paroxysmal anxiety] without agoraphobia: Secondary | ICD-10-CM

## 2013-12-02 DIAGNOSIS — F419 Anxiety disorder, unspecified: Secondary | ICD-10-CM

## 2013-12-02 DIAGNOSIS — F329 Major depressive disorder, single episode, unspecified: Secondary | ICD-10-CM

## 2013-12-02 MED ORDER — SERTRALINE HCL 50 MG PO TABS: 50.0000 mg | ORAL_TABLET | Freq: Every day | ORAL | Status: DC

## 2013-12-02 MED ORDER — ALPRAZOLAM 0.5 MG PO TABS: 0.5000 mg | ORAL_TABLET | Freq: Four times a day (QID) | ORAL | Status: DC

## 2013-12-02 MED ORDER — TRAZODONE HCL 50 MG PO TABS: 50.0000 mg | ORAL_TABLET | Freq: Every day | ORAL | Status: DC

## 2013-12-02 NOTE — Progress Notes (Signed)
Patient ID: Richard [name], male   DOB: Nov 11, 1965, 48 y.o.   MRN: [id number] Patient ID: Richard [name], male   DOB: 1965-09-30, 48 y.o.   MRN: [id number] Patient ID: Richard [name], male   DOB: 1966/02/15, 48 y.o.   MRN: [id number] Patient ID: Richard [name], male   DOB: 1966-04-21, 48 y.o.   MRN: [id number] Patient ID: Richard [name], male   DOB: 1966/06/04, 48 y.o.   MRN: [id number] Patient ID: Richard [name], male   DOB: 10-Jun-1966, 48 y.o.   MRN: [id number] [facility] [id number] Progress Note Richard [name] MRN: [id number] DOB: 01-02-1966 Age: 48 y.o.  Date: 12/02/2013  Chief Complaint  Patient presents with  . Anxiety  . Depression  . Follow-up   History of Chief Complaint:   This 48 year old Caucasian male who came for his followup appointment. He is currently living with his wife, his 86 year old daughter her husband and 2 children, and his 35 year old son in [place]. He is unemployed and applying for disability.  The patient states that he has been employed for 5 years. 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 He got injured on the job and both shoulders were torn. He has not been able to work ever since and he feels very badly about this. 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  Since he's not been able to work the patient has been having increasingly depressed and anxious. He feels like his body gives out when he is trying to exert himself even in a minor way. He's been in the ER recently for chest pain which was ruled out as a panic attack. He also saw cardiologist this week and will be having a stress test. His blood pressure was too low and his lisinopril was cut down. He is also trying to quit smoking by using electronic cigarettes.  The patient turns after 2 months. He claims he stopped the Prozac because it capsules were getting stuck in his esophagus and he can't afford the tablet form. He ran out of  the Xanax early because he took 4 a day instead of 3 a day. He states that this dosage helped him more. His mood was better on the Prozac and I told him we could try something else likes Zoloft. He is frustrated because his disability hearing was in February and he still hasn't gotten a decision. He is sleeping better because he is taking the last Xanax at bedtime. He's not sure if he is using the trazodone because his wife is managing all his medications. He denies suicidal ideation today. Anxiety Presents for follow-up visit. Symptoms include nervous/anxious behavior.     Review of Systems  Constitutional: Positive for fatigue.  Gastrointestinal: Negative.   Musculoskeletal: Positive for back pain.  Skin: Negative.   Psychiatric/Behavioral: Positive for sleep disturbance and dysphoric mood. The patient is nervous/anxious.     Physical Exam Vitals: BP 110/78  Ht 5\' 11"  (1.803 m)  Wt 207 lb (93.895 kg)  BMI 28.88 kg/m2   Past Psychiatric History: Diagnosis: none  Hospitalizations: none  Outpatient Care: none  Substance Abuse Care: none  Self-Mutilation: none  Suicidal Attempts: none  Violent Behaviors: none   Allergies: Allergies  Allergen Reactions  . Mushroom Ext Cmplx(Shiitake-Reishi-Mait) Anaphylaxis    Rapid heart rate.  . Penicillins Anaphylaxis   Medical History: Past Medical History  Diagnosis Date  .  Chest pain     + palpitations; cath 2005- 30-40% mid LAD, 20% D1, 20% cx, OM, 20-30% RCA, and EF-55%  . COPD (chronic obstructive pulmonary disease)   . GERD (gastroesophageal reflux disease)   . Hyperlipemia   . Hypertension   . Depression   . Colitis 1990  . Tobacco abuse     1/2 pack per day  . Diabetes mellitus   . Gastric ulcer 2003; 2012    2003: + esophagitis; negative H.pylori serology  2012: Dr. Oneida Alar, mild gastritis, Bravo PH probe placement, negative H.pylori  . Hepatic steatosis   . Chronic low back pain   . Panic attacks   . Asthma     Surgical History: Past Surgical History  Procedure Laterality Date  . Colonoscopy  1990  . Shoulder surgery      Right acromioclavicular joint arthrosis  . Neck mass excision    . Bravo ph study  05/03/2011    [id number] gastritis/normal esophagus and duodenum  . Cardiac catheterization  2005  . Esophagogastroduodenoscopy  05/03/2011    [id number] gastritis   Family History: family history includes ADD / ADHD in his daughter; Alcohol abuse in his father and mother; Anxiety disorder in his sister and sister; Dementia in his cousin and paternal uncle; Depression in his sister; Diabetes in his brother and father; Heart attack (age of onset: 25) in his brother; Heart attack (age of onset: 15) in his father; Hypertension in his brother, brother, and brother; Lung cancer in his mother; Seizures in his brother. There is no history of Colon cancer, Drug abuse, Bipolar disorder, OCD, Paranoid behavior, Schizophrenia, Sexual abuse, or Physical abuse. Reviewed again today and nothing new.   Current Medications:  Current Outpatient Prescriptions  Medication Sig Dispense Refill  . acetaminophen (TYLENOL) 325 MG tablet Take 650 mg by mouth every 6 (six) hours as needed for pain or fever.       [name] albuterol (VENTOLIN HFA) 108 (90 BASE) MCG/ACT inhaler Inhale 2 puffs into the lungs every 6 (six) hours as needed for wheezing or shortness of breath.       . ALPRAZolam (XANAX) 0.5 MG tablet Take 1 tablet (0.5 mg total) by mouth 4 (four) times daily.  120 tablet  2  . aspirin 81 MG tablet Take 81 mg by mouth at bedtime.       . cyclobenzaprine (FLEXERIL) 10 MG tablet Take 1 tablet (10 mg total) by mouth 3 (three) times daily as needed for muscle spasms.  30 tablet  2  . esomeprazole (NEXIUM) 40 MG capsule Take 1 capsule (40 mg total) by mouth 2 (two) times daily before a meal.  180 capsule  3  . Fluticasone-Salmeterol (ADVAIR) 250-50 MCG/DOSE AEPB Inhale 1 puff into the lungs every 12 (twelve) hours.      [name]  HYDROcodone-acetaminophen (NORCO/VICODIN) 5-325 MG per tablet Take 1 tablet by mouth every 6 (six) hours as needed for pain.  90 tablet  1  . ibuprofen (ADVIL,MOTRIN) 600 MG tablet Take 1 tablet (600 mg total) by mouth every 8 (eight) hours as needed for moderate pain.  30 tablet  1  . lisinopril (PRINIVIL,ZESTRIL) 10 MG tablet Take 1 tablet (10 mg total) by mouth daily.  90 tablet  1  . lisinopril (PRINIVIL,ZESTRIL) 20 MG tablet TAKE ONE TABLET BY MOUTH ONCE DAILY  30 tablet  2  . lovastatin (MEVACOR) 20 MG tablet TAKE ONE TABLET BY MOUTH AT BEDTIME  30 tablet  6  . metFORMIN (  GLUCOPHAGE) 500 MG tablet Take 1 tablet (500 mg total) by mouth 2 (two) times daily with a meal.  30 tablet  2  . metoprolol succinate (TOPROL-XL) 50 MG 24 hr tablet TAKE ONE & ONE-HALF TABLETS BY MOUTH ONCE DAILY  45 tablet  3  . nitroGLYCERIN (NITROSTAT) 0.4 MG SL tablet Place 0.4 mg under the tongue every 5 (five) minutes as needed for chest pain.       [name] sertraline (ZOLOFT) 50 MG tablet Take 1 tablet (50 mg total) by mouth daily.  30 tablet  2  . traZODone (DESYREL) 50 MG tablet Take 1 tablet (50 mg total) by mouth at bedtime.  30 tablet  3   No current facility-administered medications for this visit.    Previous Psychotropic Medications: Medication Dose   Xanax     Substance Abuse History in the last 12 months: Substance Age of 1st Use Last Use Amount Specific Type  Nicotine  17  2 hours ago  1  cigarette  Alcohol  22  38      Cannabis  none        Opiates  36  today  2.5 mg  hyrdocodone  Cocaine  none        Methamphetamines  none        LSD  none        Ecstasy  none         Benzodiazepines  22  started in early thrities  0.5 mg  Xanax  Caffeine  childhood  this AM  1 cup  Mt Dew  Inhalants  none        Others:      Sugar  11  this AM  20 tsps  in the [place] Consequences of Substance Abuse: pain Legal Consequences of Substance Abuse: none Family Consequences of Substance Abuse:  none Blackouts:  No DT's:  No Withdrawal Symptoms:  Yes Tremors  Social History: Patient lives with his life, daughter and her husband with 2 grandbabies.  He has 2 sons and the daughter.  He is a Programmer, systems.    Mental Status Examination/Evaluation: Objective:  Appearance: Casual  Eye Contact::  Good  Speech:  Clear and Coherent  Volume:  Normal  Mood:  worried, anxious somewhat depressed   Affect:  Congruent  Thought Process:  Coherent, Intact and Linear  Orientation:  Full (Time, Place, and Person)  Thought Content:  WDL  Suicidal Thoughts:  No  Homicidal Thoughts:  No  Judgement:  Good  Insight:  Fair  Psychomotor Activity:  Normal  Akathisia:  No  Handed:  Right  AIMS (if indicated):    Assets:  Communication Skills Desire for Improvement   Lab Results:  Results for orders placed in visit on 04/09/13 (from the past 8736 hour(s))  POCT GLYCOSYLATED HEMOGLOBIN (HGB A1C)   Collection Time    04/09/13 12:04 PM      Result Value Ref Range   Hemoglobin A1C 5.6    Results for orders placed in visit on 02/06/13 (from the past 8736 hour(s))  LIPID PANEL   Collection Time    02/24/13 10:55 AM      Result Value Ref Range   Cholesterol 152  0 - 200 mg/dL   Triglycerides 106  <150 mg/dL   HDL 33 (*) >39 mg/dL   Total CHOL/HDL Ratio 4.6     VLDL 21  0 - 40 mg/dL   LDL Cholesterol 98  0 -  99 mg/dL  BASIC METABOLIC PANEL   Collection Time    02/24/13 10:55 AM      Result Value Ref Range   Sodium 139  135 - 145 mEq/L   Potassium 4.7  3.5 - 5.3 mEq/L   Chloride 102  96 - 112 mEq/L   CO2 30  19 - 32 mEq/L   Glucose, Bld 127 (*) 70 - 99 mg/dL   BUN 12  6 - 23 mg/dL   Creat 1.07  0.50 - 1.35 mg/dL   Calcium 9.4  8.4 - 10.5 mg/dL  Results for orders placed during the hospital encounter of 02/05/13 (from the past 8736 hour(s))  TROPONIN I   Collection Time    02/05/13 12:50 PM      Result Value Ref Range   Troponin I <0.30  <0.30 ng/mL  CBC WITH DIFFERENTIAL    Collection Time    02/05/13 12:50 PM      Result Value Ref Range   WBC 6.4  4.0 - 10.5 K/uL   RBC 5.02  4.22 - 5.81 MIL/uL   Hemoglobin 16.0  13.0 - 17.0 g/dL   HCT 44.8  39.0 - 52.0 %   MCV 89.2  78.0 - 100.0 fL   MCH 31.9  26.0 - 34.0 pg   MCHC 35.7  30.0 - 36.0 g/dL   RDW 12.9  11.5 - 15.5 %   Platelets 221  150 - 400 K/uL   Neutrophils Relative % 54  43 - 77 %   Neutro Abs 3.4  1.7 - 7.7 K/uL   Lymphocytes Relative 36  12 - 46 %   Lymphs Abs 2.3  0.7 - 4.0 K/uL   Monocytes Relative 7  3 - 12 %   Monocytes Absolute 0.4  0.1 - 1.0 K/uL   Eosinophils Relative 3  0 - 5 %   Eosinophils Absolute 0.2  0.0 - 0.7 K/uL   Basophils Relative 0  0 - 1 %   Basophils Absolute 0.0  0.0 - 0.1 K/uL  COMPREHENSIVE METABOLIC PANEL   Collection Time    02/05/13 12:50 PM      Result Value Ref Range   Sodium 138  135 - 145 mEq/L   Potassium 3.9  3.5 - 5.1 mEq/L   Chloride 103  96 - 112 mEq/L   CO2 26  19 - 32 mEq/L   Glucose, Bld 186 (*) 70 - 99 mg/dL   BUN 16  6 - 23 mg/dL   Creatinine, Ser 0.93  0.50 - 1.35 mg/dL   Calcium 9.3  8.4 - 10.5 mg/dL   Total Protein 6.5  6.0 - 8.3 g/dL   Albumin 3.7  3.5 - 5.2 g/dL   AST 11  0 - 37 U/L   ALT 14  0 - 53 U/L   Alkaline Phosphatase 52  39 - 117 U/L   Total Bilirubin 0.3  0.3 - 1.2 mg/dL   GFR calc non Af Amer >90  >90 mL/min   GFR calc Af Amer >90  >90 mL/min  TROPONIN I   Collection Time    02/05/13  3:25 PM      Result Value Ref Range   Troponin I <0.30  <0.30 ng/mL    Assessment:   AXIS I Generalized Anxiety Disorder, Major Depression, Recurrent severe and Panic Disorder  AXIS II Deferred  AXIS III Past Medical History  Diagnosis Date  . Chest pain     + palpitations; cath 2005- 30-40%  mid LAD, 20% D1, 20% cx, OM, 20-30% RCA, and EF-55%  . COPD (chronic obstructive pulmonary disease)   . GERD (gastroesophageal reflux disease)   . Hyperlipemia   . Hypertension   . Depression   . Colitis 1990  . Tobacco abuse     1/2 pack per  day  . Diabetes mellitus   . Gastric ulcer 2003; 2012    2003: + esophagitis; negative H.pylori serology  2012: Dr. Oneida Alar, mild gastritis, Bravo PH probe placement, negative H.pylori  . Hepatic steatosis   . Chronic low back pain   . Panic attacks   . Asthma      AXIS IV other psychosocial or environmental problems  AXIS V 41-50 serious symptoms   Treatment Plan/Recommendations: Laboratory:    Psychotherapy: supportive psychotherapy  Medications: The patient is already discontinued Prozac. He will start Zoloft 50 mg per day and increase Xanax to 0.5 mg 4 times a day. The trazodone was reordered and he was encouraged to take this at bedtime for sleep   Routine PRN Medications:  No  Consultations: none  Safety Concerns:  none  Other:  Return in 2 months     MEDICATIONS this encounter: Meds ordered this encounter  Medications  . traZODone (DESYREL) 50 MG tablet    Sig: Take 1 tablet (50 mg total) by mouth at bedtime.    Dispense:  30 tablet    Refill:  3  . sertraline (ZOLOFT) 50 MG tablet    Sig: Take 1 tablet (50 mg total) by mouth daily.    Dispense:  30 tablet    Refill:  2  . ALPRAZolam (XANAX) 0.5 MG tablet    Sig: Take 1 tablet (0.5 mg total) by mouth 4 (four) times daily.    Dispense:  120 tablet    Refill:  2   Medical Decision Making Problem Points:  Established problem, stable/improving (1), Review of last therapy session (1) and Review of psycho-social stressors (1) Data Points:  Review or order clinical lab tests (1) Review of medication regiment & side effects (2) Review of new medications or change in dosage (2)  I certify that outpatient services furnished can reasonably be expected to improve the patient's condition.   [name], MD

## 2013-12-11 ENCOUNTER — Telehealth: Payer: Self-pay | Admitting: *Deleted

## 2013-12-11 NOTE — Telephone Encounter (Signed)
Per Almyra Free, the Rx's have been sent to [facility]. They no longer let us know the status. Pt will need to call them if he has concerns. Pt is aware.

## 2013-12-11 NOTE — Telephone Encounter (Signed)
Pt's wife called wanting to know if the office has sent in his RX. Please advise

## 2013-12-12 ENCOUNTER — Ambulatory Visit (INDEPENDENT_AMBULATORY_CARE_PROVIDER_SITE_OTHER): Payer: Self-pay | Admitting: Psychology

## 2013-12-12 ENCOUNTER — Encounter (HOSPITAL_COMMUNITY): Payer: Self-pay | Admitting: Psychology

## 2013-12-12 DIAGNOSIS — F419 Anxiety disorder, unspecified: Secondary | ICD-10-CM

## 2013-12-12 DIAGNOSIS — F329 Major depressive disorder, single episode, unspecified: Secondary | ICD-10-CM

## 2013-12-12 DIAGNOSIS — F341 Dysthymic disorder: Secondary | ICD-10-CM

## 2013-12-12 DIAGNOSIS — F4001 Agoraphobia with panic disorder: Secondary | ICD-10-CM

## 2013-12-12 NOTE — Progress Notes (Signed)
   PROGRESS NOTE  Patient:  Richard [name]   DOB: 08/30/1965  MR Number: [id number]  Location: [facility] [address] [place] [address] Dept: [phone]  Start: 10 AM End: 11 AM  Provider/Observer:     Edgardo Roys PSYD  Chief Complaint:      Chief Complaint  Patient presents with  . Depression  . Anxiety  . Stress    Reason For Service:    The patient was referred by Dr. Lattie Haw because of severe anxiety and foot appear to be panic attacks. The patient reports that he has been having a lot of anxiety and panic attacks. He has numerous medical issues including severe issues with diabetes, COPD, fatty liver disease and orthopedic issues. He also has had a long history of depression anxiety as well. Anxiety and depression have been going on for least 5 years and correlate with his health issues and financial difficulties. The patient relates this deterioration to an injury at work where he injured both of his shoulders while pulling a pallet. He had surgery on one of his shoulders initially and he experiences around his injury and surgery began to develop panic attacks. The patient is also been diagnosed with COPD and diabetes but continues to smoke and fears that if he completely quit smoking that he will gain a lot of weight again.   Interventions Strategy:  Cognitive/behavioral psychotherapeutic interventions  Participation Level:   Active  Participation Quality:  Appropriate      Behavioral Observation:  Fairly Groomed, Alert, and Appropriate.   Current Psychosocial Factors: Patient reports that panic have improved some but continue.  Worry about bills and no money are big driver of stress.  Content of Session:   Review current symptoms and continued work on therapeutic interventions around numerous medical issues and panic attacks.  Current Status:   Patient reports that he  had continued to have severe pain but reduced panic attacks.  Breathing still a big trouble.  Patient Progress:   Stable  Target Goals:   Target goals include reduce the intensity, duration, and frequency of panic attacks, reduce the amount of benzodiazepine or other potentially dependence forming medications.  Last Reviewed:   12/12/2013  Goals Addressed Today:    Today we worked on issues of reducing the intensity and frequency of panic attacks  Impression/Diagnosis:   the patient appears to be doing with panic attacks along with anxiety and depression. He has numerous medical issues including COPD, diabetes, and other issues as well as liver disease. The patient is not taking very good care of himself and it was stressed specifically that he is going to need to quit smoking cigarettes and completely stopped caffeinated and sugar drinks if he is going to have any hope of improving his quality of life. The patient was amenable to initially try to stop the caffeinated drinks and we developed a plan for this. After that we will begin to address the smoking.    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

## 2014-01-12 ENCOUNTER — Ambulatory Visit (INDEPENDENT_AMBULATORY_CARE_PROVIDER_SITE_OTHER): Payer: Self-pay | Admitting: Psychology

## 2014-01-12 ENCOUNTER — Encounter (HOSPITAL_COMMUNITY): Payer: Self-pay | Admitting: Psychology

## 2014-01-12 DIAGNOSIS — M545 Low back pain, unspecified: Secondary | ICD-10-CM

## 2014-01-12 DIAGNOSIS — F419 Anxiety disorder, unspecified: Secondary | ICD-10-CM

## 2014-01-12 DIAGNOSIS — F341 Dysthymic disorder: Secondary | ICD-10-CM

## 2014-01-12 DIAGNOSIS — F32A Depression, unspecified: Secondary | ICD-10-CM

## 2014-01-12 DIAGNOSIS — G8929 Other chronic pain: Secondary | ICD-10-CM

## 2014-01-12 DIAGNOSIS — F4001 Agoraphobia with panic disorder: Secondary | ICD-10-CM

## 2014-01-12 DIAGNOSIS — F329 Major depressive disorder, single episode, unspecified: Secondary | ICD-10-CM

## 2014-01-12 NOTE — Progress Notes (Signed)
PROGRESS NOTE  Patient:  Richard [name]   DOB: 10-13-1965  MR Number: [id number]  Location: [facility] [address] [place] [address] Dept: [phone]  Start: 11 AM End: 12 AM  Provider/Observer:     Edgardo Roys PSYD  Chief Complaint:      Chief Complaint  Patient presents with  . Anxiety  . Depression  . Stress    Reason For Service:    The patient was referred by Dr. Lattie Haw because of severe anxiety and foot appear to be panic attacks. The patient reports that he has been having a lot of anxiety and panic attacks. He has numerous medical issues including severe issues with diabetes, COPD, fatty liver disease and orthopedic issues. He also has had a long history of depression anxiety as well. Anxiety and depression have been going on for least 5 years and correlate with his health issues and financial difficulties. The patient relates this deterioration to an injury at work where he injured both of his shoulders while pulling a pallet. He had surgery on one of his shoulders initially and he experiences around his injury and surgery began to develop panic attacks. The patient is also been diagnosed with COPD and diabetes but continues to smoke and fears that if he completely quit smoking that he will gain a lot of weight again.   Interventions Strategy:  Cognitive/behavioral psychotherapeutic interventions  Participation Level:   Active  Participation Quality:  Appropriate      Behavioral Observation:  Fairly Groomed, Alert, and Appropriate.   Current Psychosocial Factors: The patient reports that he is having more trouble with left leg, more trouble with breathing and more pain.  The patient reports that he has not had pain meds for over a week due to no money to fill prescription.  He has not been able to do things with family due to health and pain.  Content of  Session:   Review current symptoms and continued work on therapeutic interventions around numerous medical issues and panic attacks.  Current Status:   Patient reports that he had continued to have severe pain but reduced panic attacks.  Breathing still a big trouble.  Patient Progress:   Stable  Target Goals:   Target goals include reduce the intensity, duration, and frequency of panic attacks, reduce the amount of benzodiazepine or other potentially dependence forming medications.  Last Reviewed:   12/12/2013  Goals Addressed Today:    Today we worked on issues of reducing the intensity and frequency of panic attacks  Impression/Diagnosis:   the patient appears to be doing with panic attacks along with anxiety and depression. He has numerous medical issues including COPD, diabetes, and other issues as well as liver disease. The patient is not taking very good care of himself and it was stressed specifically that he is going to need to quit smoking cigarettes and completely stopped caffeinated and sugar drinks if he is going to have any hope of improving his quality of life. The patient was amenable to initially try to stop the caffeinated drinks and we developed a plan for this. After that we will begin to address the smoking.    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

## 2014-01-27 ENCOUNTER — Encounter: Payer: Self-pay | Admitting: Cardiovascular Disease

## 2014-01-27 ENCOUNTER — Ambulatory Visit (INDEPENDENT_AMBULATORY_CARE_PROVIDER_SITE_OTHER): Payer: Self-pay | Admitting: Cardiovascular Disease

## 2014-01-27 VITALS — BP 122/64 | HR 56 | Ht 71.0 in | Wt 213.0 lb

## 2014-01-27 DIAGNOSIS — M48062 Spinal stenosis, lumbar region with neurogenic claudication: Secondary | ICD-10-CM

## 2014-01-27 DIAGNOSIS — I7090 Unspecified atherosclerosis: Secondary | ICD-10-CM

## 2014-01-27 DIAGNOSIS — I2510 Atherosclerotic heart disease of native coronary artery without angina pectoris: Secondary | ICD-10-CM

## 2014-01-27 DIAGNOSIS — F17200 Nicotine dependence, unspecified, uncomplicated: Secondary | ICD-10-CM

## 2014-01-27 DIAGNOSIS — F329 Major depressive disorder, single episode, unspecified: Secondary | ICD-10-CM

## 2014-01-27 DIAGNOSIS — I739 Peripheral vascular disease, unspecified: Secondary | ICD-10-CM

## 2014-01-27 DIAGNOSIS — E785 Hyperlipidemia, unspecified: Secondary | ICD-10-CM

## 2014-01-27 DIAGNOSIS — F341 Dysthymic disorder: Secondary | ICD-10-CM

## 2014-01-27 DIAGNOSIS — F419 Anxiety disorder, unspecified: Secondary | ICD-10-CM

## 2014-01-27 DIAGNOSIS — R002 Palpitations: Secondary | ICD-10-CM

## 2014-01-27 DIAGNOSIS — R079 Chest pain, unspecified: Secondary | ICD-10-CM

## 2014-01-27 DIAGNOSIS — I10 Essential (primary) hypertension: Secondary | ICD-10-CM

## 2014-01-27 NOTE — Progress Notes (Signed)
Patient ID: Richard [name], male   DOB: 12/13/1965, 48 y.o.   MRN: [id number]      SUBJECTIVE: Richard [name] has a h/o multiple medical problems, including mild to moderate CAD by cath in 2005, HTN, COPD, chronic chest pain, tobacco abuse, GERD, diabetes mellitus, chronic low back pain, anxiety/depression, panic attacks, palpitations, and hyperlipidemia. He underwent a low risk Lexiscan Cardiolite stress test in September 2014, with no evidence of myocardial ischemia or scar and normal left ventricular systolic function.  He continues to experience chest pain which lasts seconds and is exacerbated with deep breaths. He smokes almost a half pack of cigarettes daily. He used to smoke 1-2 packs daily. He complains of a "gurgling sensation" in his left ankle and foot. He is wondering if he has circulation problems. He denies claudication pain in the calf, but does say that he gets pain in the back of his right thigh and quadricep. It is alleviated by keeping his legs elevated while resting. He also has severe arthritis of the lumbar spine.  ECG performed in the office today demonstrates sinus bradycardia, heart rate 53 beats per minute.   Allergies  Allergen Reactions  . Mushroom Ext Cmplx(Shiitake-Reishi-Mait) Anaphylaxis    Rapid heart rate.  . Penicillins Anaphylaxis    Current Outpatient Prescriptions  Medication Sig Dispense Refill  . acetaminophen (TYLENOL) 325 MG tablet Take 650 mg by mouth every 6 (six) hours as needed for pain or fever.       [name] albuterol (VENTOLIN HFA) 108 (90 BASE) MCG/ACT inhaler Inhale 2 puffs into the lungs every 6 (six) hours as needed for wheezing or shortness of breath.       . ALPRAZolam (XANAX) 0.5 MG tablet Take 1 tablet (0.5 mg total) by mouth 4 (four) times daily.  120 tablet  2  . aspirin 81 MG tablet Take 81 mg by mouth at bedtime.       . cyclobenzaprine (FLEXERIL) 10 MG tablet Take 1 tablet (10 mg total) by mouth 3 (three) times daily as needed for muscle  spasms.  30 tablet  2  . esomeprazole (NEXIUM) 40 MG capsule Take 1 capsule (40 mg total) by mouth 2 (two) times daily before a meal.  180 capsule  3  . Fluticasone-Salmeterol (ADVAIR) 250-50 MCG/DOSE AEPB Inhale 1 puff into the lungs every 12 (twelve) hours.      [name] HYDROcodone-acetaminophen (NORCO/VICODIN) 5-325 MG per tablet Take 1 tablet by mouth every 6 (six) hours as needed for pain.  90 tablet  1  . ibuprofen (ADVIL,MOTRIN) 600 MG tablet Take 1 tablet (600 mg total) by mouth every 8 (eight) hours as needed for moderate pain.  30 tablet  1  . lisinopril (PRINIVIL,ZESTRIL) 10 MG tablet Take 1 tablet (10 mg total) by mouth daily.  90 tablet  1  . lovastatin (MEVACOR) 20 MG tablet TAKE ONE TABLET BY MOUTH AT BEDTIME  30 tablet  6  . metFORMIN (GLUCOPHAGE) 500 MG tablet Take 1 tablet (500 mg total) by mouth 2 (two) times daily with a meal.  30 tablet  2  . metoprolol succinate (TOPROL-XL) 50 MG 24 hr tablet TAKE ONE & ONE-HALF TABLETS BY MOUTH ONCE DAILY  45 tablet  3  . nitroGLYCERIN (NITROSTAT) 0.4 MG SL tablet Place 0.4 mg under the tongue every 5 (five) minutes as needed for chest pain.       [name] sertraline (ZOLOFT) 50 MG tablet Take 1 tablet (50 mg total) by mouth daily.  [place]  tablet  2  . traZODone (DESYREL) 50 MG tablet Take 1 tablet (50 mg total) by mouth at bedtime.  30 tablet  3   No current facility-administered medications for this visit.    Past Medical History  Diagnosis Date  . Chest pain     + palpitations; cath 2005- 30-40% mid LAD, 20% D1, 20% cx, OM, 20-30% RCA, and EF-55%  . COPD (chronic obstructive pulmonary disease)   . GERD (gastroesophageal reflux disease)   . Hyperlipemia   . Hypertension   . Depression   . Colitis 1990  . Tobacco abuse     1/2 pack per day  . Diabetes mellitus   . Gastric ulcer 2003; 2012    2003: + esophagitis; negative H.pylori serology  2012: Dr. Oneida Alar, mild gastritis, Bravo PH probe placement, negative H.pylori  . Hepatic steatosis   .  Chronic low back pain   . Panic attacks   . Asthma     Past Surgical History  Procedure Laterality Date  . Colonoscopy  1990  . Shoulder surgery      Right acromioclavicular joint arthrosis  . Neck mass excision    . Bravo ph study  05/03/2011    [facility]:XNAT gastritis/normal esophagus and duodenum  . Cardiac catheterization  2005  . Esophagogastroduodenoscopy  05/03/2011    FTD:DUKG gastritis    History   Social History  . Marital Status: Married    Spouse Name: N/A    Number of Children: N/A  . Years of Education: N/A   Occupational History  . full time    Social History Main Topics  . Smoking status: Current Every Day Smoker -- 0.50 packs/day for 29 years    Types: Cigarettes  . Smokeless tobacco: Never Used     Comment: on the verge of quitting as of 01-03-13  . Alcohol Use: No  . Drug Use: No  . Sexual Activity: Yes   Other Topics Concern  . Not on file   Social History Narrative  . No narrative on file     Filed Vitals:   01/27/14 1322  BP: 122/64  Pulse: 56  Height: 5\' 11"  (1.803 m)  Weight: 213 lb (96.616 kg)    PHYSICAL EXAM General: NAD Neck: No JVD, no thyromegaly. Lungs: Clear to auscultation bilaterally with normal respiratory effort. CV: Nondisplaced PMI.  Regular rate and rhythm, normal S1/S2, no S3/S4, no murmur. No pretibial or periankle edema. Normal pedal pulses.  Abdomen: Soft, nontender, no hepatosplenomegaly, no distention.  Neurologic: Alert and oriented x 3.  Psych: Normal affect. Extremities: No clubbing or cyanosis.   ECG: reviewed and available in electronic records.    ASSESSMENT AND PLAN: 1. CAD: Normal Lexiscan Cardiolite stress test in 03/2013. Chest pain symptoms are pleuritic, and not related to CAD. ECG without ischemic ST-T abnormalities. 2. HTN: Controlled on current therapy. 3. Palpitations: Controlled on current dose of Metoprolol.   4. Hyperlipidemia: Will check lipids and LFT's at next visit. On Lovastatin. 5.  Right leg pain: Given his long history of tobacco use, screening ABI's are warranted. I will obtain these and inform him of the results. I suspect there is also a component of lumbar stenosis with radiation of pain to his left leg.  Dispo: f/u 1 year.  Kate Sable, M.D., F.A.C.C.

## 2014-01-27 NOTE — Patient Instructions (Signed)
Your physician wants you to follow-up in: 1 year with Dr. Bronson Ing. You will receive a reminder letter in the mail two months in advance. If you don't receive a letter, please call our office to schedule the follow-up appointment.  Your physician recommends that you continue on your current medications as directed. Please refer to the Current Medication list given to you today.  Your physician has requested that you have an ankle brachial index (ABI). During this test an ultrasound and blood pressure cuff are used to evaluate the arteries that supply the arms and legs with blood. Allow thirty minutes for this exam. There are no restrictions or special instructions.  Thank you for choosing [facility]!!

## 2014-01-29 ENCOUNTER — Ambulatory Visit (HOSPITAL_COMMUNITY)
Admission: RE | Admit: 2014-01-29 | Discharge: 2014-01-29 | Disposition: A | Discharge status: Home / Self Care | Payer: Self-pay | Source: Ambulatory Visit | Attending: Cardiovascular Disease | Admitting: Cardiovascular Disease

## 2014-01-29 DIAGNOSIS — E119 Type 2 diabetes mellitus without complications: Secondary | ICD-10-CM | POA: Insufficient documentation

## 2014-01-29 DIAGNOSIS — I739 Peripheral vascular disease, unspecified: Secondary | ICD-10-CM | POA: Insufficient documentation

## 2014-01-29 DIAGNOSIS — I70229 Atherosclerosis of native arteries of extremities with rest pain, unspecified extremity: Secondary | ICD-10-CM | POA: Insufficient documentation

## 2014-01-29 DIAGNOSIS — I10 Essential (primary) hypertension: Secondary | ICD-10-CM | POA: Insufficient documentation

## 2014-01-30 ENCOUNTER — Telehealth: Payer: Self-pay | Admitting: *Deleted

## 2014-01-30 ENCOUNTER — Encounter (HOSPITAL_COMMUNITY): Payer: Self-pay | Admitting: Psychiatry

## 2014-01-30 ENCOUNTER — Ambulatory Visit (INDEPENDENT_AMBULATORY_CARE_PROVIDER_SITE_OTHER): Payer: Self-pay | Admitting: Psychiatry

## 2014-01-30 VITALS — BP 108/64 | Ht 71.0 in | Wt 212.0 lb

## 2014-01-30 DIAGNOSIS — M545 Low back pain, unspecified: Secondary | ICD-10-CM

## 2014-01-30 DIAGNOSIS — F341 Dysthymic disorder: Secondary | ICD-10-CM

## 2014-01-30 DIAGNOSIS — G8929 Other chronic pain: Secondary | ICD-10-CM

## 2014-01-30 DIAGNOSIS — F329 Major depressive disorder, single episode, unspecified: Secondary | ICD-10-CM

## 2014-01-30 DIAGNOSIS — F4001 Agoraphobia with panic disorder: Secondary | ICD-10-CM

## 2014-01-30 DIAGNOSIS — F419 Anxiety disorder, unspecified: Secondary | ICD-10-CM

## 2014-01-30 DIAGNOSIS — F17200 Nicotine dependence, unspecified, uncomplicated: Secondary | ICD-10-CM

## 2014-01-30 MED ORDER — ALPRAZOLAM 0.5 MG PO TABS: 0.5000 mg | ORAL_TABLET | Freq: Four times a day (QID) | ORAL | Status: DC

## 2014-01-30 NOTE — Progress Notes (Signed)
Patient ID: Richard [name], male   DOB: 08/20/1965, 48 y.o.   MRN: [id number] Patient ID: Richard [name], male   DOB: 1966/03/09, 48 y.o.   MRN: [id number] Patient ID: Richard [name], male   DOB: 01-Jul-1966, 48 y.o.   MRN: [id number] Patient ID: Richard [name], male   DOB: April 22, 1966, 48 y.o.   MRN: [id number] Patient ID: Richard [name], male   DOB: [birth year], 48 y.o.   MRN: [id number] Patient ID: Richard [name], male   DOB: [birth year], 48 y.o.   MRN: [id number] Patient ID: Richard [name], male   DOB: April 29, 1966, 48 y.o.   MRN: [id number] [facility] [id number] Progress Note Richard [name] MRN: [id number] DOB: 04/14/1966 Age: 48 y.o.  Date: 01/30/2014  Chief Complaint  Patient presents with  . Anxiety  . Depression  . Follow-up   History of Chief Complaint:   This 48 year old Caucasian male who came for his followup appointment. He is currently living with his wife, his 33 year old daughter her husband and 2 children, and his 51 year old son in [place]. He is unemployed and applying for disability.  The patient states that he has been employed for 5 years. 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 He got injured on the job and both shoulders were torn. He has not been able to work ever since and he feels very badly about this. 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  Since he's not been able to work the patient has been having increasingly depressed and anxious. He feels like his body gives out when he is trying to exert himself even in a minor way. He's been in the ER recently for chest pain which was ruled out as a panic attack. He also saw cardiologist this week and will be having a stress test. His blood pressure was too low and his lisinopril was cut down. He is also trying to quit smoking by using electronic cigarettes.  The patient turns after 2 months. He claims he stopped the Zoloft after 2 days because it wasn't  helping. I explained to him that antidepressants take several weeks to work. The Xanax helps and this is the only thing he "believes in" he still struggling financially and his disability he has not been decided on that. He states that he and his wife are "counting every penny". He is frustrated and worried but claims he would never harm himself. He still has a lot of anxiety about leaving his house. He did finally agree to try the Zoloft and stick with it for several weeks Anxiety Presents for follow-up visit. Symptoms include nervous/anxious behavior.     Review of Systems  Constitutional: Positive for fatigue.  Gastrointestinal: Negative.   Musculoskeletal: Positive for back pain.  Skin: Negative.   Psychiatric/Behavioral: Positive for sleep disturbance and dysphoric mood. The patient is nervous/anxious.     Physical Exam Vitals: BP 108/64  Ht 5\' 11"  (1.803 m)  Wt 212 lb (96.163 kg)  BMI 29.58 kg/m2   Past Psychiatric History: Diagnosis: none  Hospitalizations: none  Outpatient Care: none  Substance Abuse Care: none  Self-Mutilation: none  Suicidal Attempts: none  Violent Behaviors: none   Allergies: Allergies  Allergen Reactions  . Mushroom Ext Cmplx(Shiitake-Reishi-Mait) Anaphylaxis    Rapid heart rate.  . Penicillins Anaphylaxis   Medical History: Past Medical History  Diagnosis Date  . Chest pain     +  palpitations; cath 2005- 30-40% mid LAD, 20% D1, 20% cx, OM, 20-30% RCA, and EF-55%  . COPD (chronic obstructive pulmonary disease)   . GERD (gastroesophageal reflux disease)   . Hyperlipemia   . Hypertension   . Depression   . Colitis 1990  . Tobacco abuse     1/2 pack per day  . Diabetes mellitus   . Gastric ulcer 2003; 2012    2003: + esophagitis; negative H.pylori serology  2012: Dr. Oneida Alar, mild gastritis, Bravo PH probe placement, negative H.pylori  . Hepatic steatosis   . Chronic low back pain   . Panic attacks   . Asthma    Surgical  History: Past Surgical History  Procedure Laterality Date  . Colonoscopy  1990  . Shoulder surgery      Right acromioclavicular joint arthrosis  . Neck mass excision    . Bravo ph study  05/03/2011    [facility]:ZESP gastritis/normal esophagus and duodenum  . Cardiac catheterization  2005  . Esophagogastroduodenoscopy  05/03/2011    [facility]:AQTM gastritis   Family History: family history includes ADD / ADHD in his daughter; Alcohol abuse in his father and mother; Anxiety disorder in his sister and sister; Dementia in his cousin and paternal uncle; Depression in his sister; Diabetes in his brother and father; Heart attack (age of onset: 76) in his brother; Heart attack (age of onset: 56) in his father; Hypertension in his brother, brother, and brother; Lung cancer in his mother; Seizures in his brother. There is no history of Colon cancer, Drug abuse, Bipolar disorder, OCD, Paranoid behavior, Schizophrenia, Sexual abuse, or Physical abuse. Reviewed again today and nothing new.   Current Medications:  Current Outpatient Prescriptions  Medication Sig Dispense Refill  . acetaminophen (TYLENOL) 325 MG tablet Take 650 mg by mouth every 6 (six) hours as needed for pain or fever.       [name] albuterol (VENTOLIN HFA) 108 (90 BASE) MCG/ACT inhaler Inhale 2 puffs into the lungs every 6 (six) hours as needed for wheezing or shortness of breath.       . ALPRAZolam (XANAX) 0.5 MG tablet Take 1 tablet (0.5 mg total) by mouth 4 (four) times daily.  120 tablet  2  . aspirin 81 MG tablet Take 81 mg by mouth at bedtime.       . cyclobenzaprine (FLEXERIL) 10 MG tablet Take 1 tablet (10 mg total) by mouth 3 (three) times daily as needed for muscle spasms.  30 tablet  2  . esomeprazole (NEXIUM) 40 MG capsule Take 1 capsule (40 mg total) by mouth 2 (two) times daily before a meal.  180 capsule  3  . Fluticasone-Salmeterol (ADVAIR) 250-50 MCG/DOSE AEPB Inhale 1 puff into the lungs every 12 (twelve) hours.      [name]  HYDROcodone-acetaminophen (NORCO/VICODIN) 5-325 MG per tablet Take 1 tablet by mouth every 6 (six) hours as needed for pain.  90 tablet  1  . ibuprofen (ADVIL,MOTRIN) 600 MG tablet Take 1 tablet (600 mg total) by mouth every 8 (eight) hours as needed for moderate pain.  30 tablet  1  . lisinopril (PRINIVIL,ZESTRIL) 10 MG tablet Take 1 tablet (10 mg total) by mouth daily.  90 tablet  1  . lovastatin (MEVACOR) 20 MG tablet TAKE ONE TABLET BY MOUTH AT BEDTIME  30 tablet  6  . metFORMIN (GLUCOPHAGE) 500 MG tablet Take 1 tablet (500 mg total) by mouth 2 (two) times daily with a meal.  30 tablet  2  .  metoprolol succinate (TOPROL-XL) 50 MG 24 hr tablet TAKE ONE & ONE-HALF TABLETS BY MOUTH ONCE DAILY  45 tablet  3  . nitroGLYCERIN (NITROSTAT) 0.4 MG SL tablet Place 0.4 mg under the tongue every 5 (five) minutes as needed for chest pain.       [name] sertraline (ZOLOFT) 50 MG tablet Take 1 tablet (50 mg total) by mouth daily.  30 tablet  2   No current facility-administered medications for this visit.    Previous Psychotropic Medications: Medication Dose   Xanax     Substance Abuse History in the last 12 months: Substance Age of 1st Use Last Use Amount Specific Type  Nicotine  17  2 hours ago  1  cigarette  Alcohol  22  38      Cannabis  none        Opiates  36  today  2.5 mg  hyrdocodone  Cocaine  none        Methamphetamines  none        LSD  none        Ecstasy  none         Benzodiazepines  22  started in early thrities  0.5 mg  Xanax  Caffeine  childhood  this AM  1 cup  [place]  Inhalants  none        Others:      Sugar  11  this AM  20 tsps  in the [place] Consequences of Substance Abuse: pain Legal Consequences of Substance Abuse: none Family Consequences of Substance Abuse: none Blackouts:  No DT's:  No Withdrawal Symptoms:  Yes Tremors  Social History: Patient lives with his life, daughter and her husband with 2 grandbabies.  He has 2 sons and the daughter.  He is a Dispensing optician.    Mental Status Examination/Evaluation: Objective:  Appearance: Casual  Eye Contact::  Good  Speech:  Clear and Coherent  Volume:  Normal  Mood:  worried, anxious somewhat depressed   Affect:  Congruent  Thought Process:  Coherent, Intact and Linear  Orientation:  Full (Time, Place, and Person)  Thought Content:  WDL  Suicidal Thoughts:  No  Homicidal Thoughts:  No  Judgement:  Good  Insight:  Fair  Psychomotor Activity:  Normal  Akathisia:  No  Handed:  Right  AIMS (if indicated):    Assets:  Communication Skills Desire for Improvement   Lab Results:  Results for orders placed in visit on 04/09/13 (from the past 8736 hour(s))  POCT GLYCOSYLATED HEMOGLOBIN (HGB A1C)   Collection Time    04/09/13 12:04 PM      Result Value Ref Range   Hemoglobin A1C 5.6    Results for orders placed in visit on 02/06/13 (from the past 8736 hour(s))  LIPID PANEL   Collection Time    02/24/13 10:55 AM      Result Value Ref Range   Cholesterol 152  0 - 200 mg/dL   Triglycerides 106  <150 mg/dL   HDL 33 (*) >39 mg/dL   Total CHOL/HDL Ratio 4.6     VLDL 21  0 - 40 mg/dL   LDL Cholesterol 98  0 - 99 mg/dL  BASIC METABOLIC PANEL   Collection Time    02/24/13 10:55 AM      Result Value Ref Range   Sodium 139  135 - 145 mEq/L   Potassium 4.7  3.5 - 5.3 mEq/L   Chloride 102  96 - 112 mEq/L   CO2 30  19 - 32 mEq/L   Glucose, Bld 127 (*) 70 - 99 mg/dL   BUN 12  6 - 23 mg/dL   Creat 1.07  0.50 - 1.35 mg/dL   Calcium 9.4  8.4 - 10.5 mg/dL  Results for orders placed during the hospital encounter of 02/05/13 (from the past 8736 hour(s))  TROPONIN I   Collection Time    02/05/13 12:50 PM      Result Value Ref Range   Troponin I <0.30  <0.30 ng/mL  CBC WITH DIFFERENTIAL   Collection Time    02/05/13 12:50 PM      Result Value Ref Range   WBC 6.4  4.0 - 10.5 K/uL   RBC 5.02  4.22 - 5.81 MIL/uL   Hemoglobin 16.0  13.0 - 17.0 g/dL   HCT 44.8  39.0 - 52.0 %   MCV 89.2   78.0 - 100.0 fL   MCH 31.9  26.0 - 34.0 pg   MCHC 35.7  30.0 - 36.0 g/dL   RDW 12.9  11.5 - 15.5 %   Platelets 221  150 - 400 K/uL   Neutrophils Relative % 54  43 - 77 %   Neutro Abs 3.4  1.7 - 7.7 K/uL   Lymphocytes Relative 36  12 - 46 %   Lymphs Abs 2.3  0.7 - 4.0 K/uL   Monocytes Relative 7  3 - 12 %   Monocytes Absolute 0.4  0.1 - 1.0 K/uL   Eosinophils Relative 3  0 - 5 %   Eosinophils Absolute 0.2  0.0 - 0.7 K/uL   Basophils Relative 0  0 - 1 %   Basophils Absolute 0.0  0.0 - 0.1 K/uL  COMPREHENSIVE METABOLIC PANEL   Collection Time    02/05/13 12:50 PM      Result Value Ref Range   Sodium 138  135 - 145 mEq/L   Potassium 3.9  3.5 - 5.1 mEq/L   Chloride 103  96 - 112 mEq/L   CO2 26  19 - 32 mEq/L   Glucose, Bld 186 (*) 70 - 99 mg/dL   BUN 16  6 - 23 mg/dL   Creatinine, Ser 0.93  0.50 - 1.35 mg/dL   Calcium 9.3  8.4 - 10.5 mg/dL   Total Protein 6.5  6.0 - 8.3 g/dL   Albumin 3.7  3.5 - 5.2 g/dL   AST 11  0 - 37 U/L   ALT 14  0 - 53 U/L   Alkaline Phosphatase 52  39 - 117 U/L   Total Bilirubin 0.3  0.3 - 1.2 mg/dL   GFR calc non Af Amer >90  >90 mL/min   GFR calc Af Amer >90  >90 mL/min  TROPONIN I   Collection Time    02/05/13  3:25 PM      Result Value Ref Range   Troponin I <0.30  <0.30 ng/mL    Assessment:   AXIS I Generalized Anxiety Disorder, Major Depression, Recurrent severe and Panic Disorder  AXIS II Deferred  AXIS III Past Medical History  Diagnosis Date  . Chest pain     + palpitations; cath 2005- 30-40% mid LAD, 20% D1, 20% cx, OM, 20-30% RCA, and EF-55%  . COPD (chronic obstructive pulmonary disease)   . GERD (gastroesophageal reflux disease)   . Hyperlipemia   . Hypertension   . Depression   . Colitis 1990  . Tobacco abuse  1/2 pack per day  . Diabetes mellitus   . Gastric ulcer 2003; 2012    2003: + esophagitis; negative H.pylori serology  2012: Dr. Oneida Alar, mild gastritis, Bravo PH probe placement, negative H.pylori  . Hepatic  steatosis   . Chronic low back pain   . Panic attacks   . Asthma      AXIS IV other psychosocial or environmental problems  AXIS V 41-50 serious symptoms   Treatment Plan/Recommendations: Laboratory:    Psychotherapy: supportive psychotherapy  Medications:  He will start Zoloft 50 mg per day and Xanax to 0.5 mg 4 times a day.    Routine PRN Medications:  No  Consultations: none  Safety Concerns:  none  Other:  Return in 2 months     MEDICATIONS this encounter: Meds ordered this encounter  Medications  . ALPRAZolam (XANAX) 0.5 MG tablet    Sig: Take 1 tablet (0.5 mg total) by mouth 4 (four) times daily.    Dispense:  120 tablet    Refill:  2   Medical Decision Making Problem Points:  Established problem, stable/improving (1), Review of last therapy session (1) and Review of psycho-social stressors (1) Data Points:  Review or order clinical lab tests (1) Review of medication regiment & side effects (2) Review of new medications or change in dosage (2)  I certify that outpatient services furnished can reasonably be expected to improve the patient's condition.   [name], MD

## 2014-01-30 NOTE — Telephone Encounter (Signed)
Notified pt of normal ABI. Pt understood

## 2014-01-30 NOTE — Telephone Encounter (Signed)
Message copied by Desma Mcgregor on Fri Jan 30, 2014  1:09 PM ------      Message from: Kate Sable A      Created: Fri Jan 30, 2014 12:55 PM       Normal rest ABI's. ------

## 2014-02-02 ENCOUNTER — Ambulatory Visit (HOSPITAL_COMMUNITY): Payer: Self-pay | Admitting: Psychiatry

## 2014-02-13 ENCOUNTER — Ambulatory Visit (HOSPITAL_COMMUNITY): Payer: Self-pay | Admitting: Psychology

## 2014-03-06 ENCOUNTER — Ambulatory Visit (HOSPITAL_COMMUNITY): Payer: Self-pay | Admitting: Psychology

## 2014-03-13 ENCOUNTER — Telehealth: Payer: Self-pay | Admitting: *Deleted

## 2014-03-13 MED ORDER — LOVASTATIN 20 MG PO TABS: 20.0000 mg | ORAL_TABLET | Freq: Every day | ORAL | Status: DC

## 2014-03-13 NOTE — Telephone Encounter (Signed)
Refill complete 

## 2014-03-13 NOTE — Telephone Encounter (Signed)
Pt needs lovastatin called in to [facility]

## 2014-03-16 ENCOUNTER — Telehealth: Payer: Self-pay | Admitting: Cardiovascular Disease

## 2014-03-16 MED ORDER — LOVASTATIN 20 MG PO TABS: 20.0000 mg | ORAL_TABLET | Freq: Every day | ORAL | Status: DC

## 2014-03-16 NOTE — Telephone Encounter (Signed)
Refill complete 

## 2014-03-16 NOTE — Telephone Encounter (Signed)
Received fax refill request  Rx # [id number] Medication:  Lovastatin 20 mg tab Qty 30  Sig:  Take one tablet by mouth at bedtime Physician:  Bronson Ing

## 2014-04-02 ENCOUNTER — Ambulatory Visit (INDEPENDENT_AMBULATORY_CARE_PROVIDER_SITE_OTHER): Payer: Self-pay | Admitting: Psychiatry

## 2014-04-02 ENCOUNTER — Encounter (HOSPITAL_COMMUNITY): Payer: Self-pay | Admitting: Psychiatry

## 2014-04-02 VITALS — BP 122/63 | HR 53 | Ht 71.0 in | Wt 212.2 lb

## 2014-04-02 DIAGNOSIS — G8929 Other chronic pain: Secondary | ICD-10-CM

## 2014-04-02 DIAGNOSIS — M545 Low back pain: Secondary | ICD-10-CM

## 2014-04-02 DIAGNOSIS — F410 Panic disorder [episodic paroxysmal anxiety] without agoraphobia: Secondary | ICD-10-CM

## 2014-04-02 DIAGNOSIS — F332 Major depressive disorder, recurrent severe without psychotic features: Secondary | ICD-10-CM

## 2014-04-02 DIAGNOSIS — F329 Major depressive disorder, single episode, unspecified: Secondary | ICD-10-CM

## 2014-04-02 DIAGNOSIS — F17200 Nicotine dependence, unspecified, uncomplicated: Secondary | ICD-10-CM

## 2014-04-02 DIAGNOSIS — F419 Anxiety disorder, unspecified: Secondary | ICD-10-CM

## 2014-04-02 DIAGNOSIS — F4001 Agoraphobia with panic disorder: Secondary | ICD-10-CM

## 2014-04-02 DIAGNOSIS — F411 Generalized anxiety disorder: Secondary | ICD-10-CM

## 2014-04-02 MED ORDER — SERTRALINE HCL 50 MG PO TABS: 50.0000 mg | ORAL_TABLET | Freq: Every day | ORAL | Status: DC

## 2014-04-02 MED ORDER — ALPRAZOLAM 0.5 MG PO TABS: 0.5000 mg | ORAL_TABLET | Freq: Four times a day (QID) | ORAL | Status: DC

## 2014-04-02 NOTE — Progress Notes (Signed)
Patient ID: Richard [name], male   DOB: [birth year], 48 y.o.   MRN: [id number] Patient ID: Richard [name], male   DOB: 10/30/1965, 48 y.o.   MRN: [id number] Patient ID: Richard [name], male   DOB: 29-Oct-1965, 48 y.o.   MRN: [id number] Patient ID: Richard [name], male   DOB: Mar 02, 1966, 48 y.o.   MRN: [id number] Patient ID: Richard [name], male   DOB: 12-29-1965, 48 y.o.   MRN: [id number] Patient ID: Richard [name], male   DOB: June 29, 1966, 48 y.o.   MRN: [id number] Patient ID: Richard [name], male   DOB: August 19, 1965, 48 y.o.   MRN: [id number] Patient ID: Richard [name], male   DOB: 03-12-1966, 48 y.o.   MRN: [id number] [facility] [id number] Progress Note Richard [name] MRN: [id number] DOB: Aug 05, 1965 Age: 48 y.o.  Date: 04/02/2014  Chief Complaint  Patient presents with  . Depression  . Anxiety  . Follow-up   History of Chief Complaint:   This 48 year old Caucasian male who came for his followup appointment. He is currently living with his wife in [place]. He is unemployed and applying for disability.  The patient states that he has been employed for 5 years. 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 He got injured on the job and both shoulders were torn. He has not been able to work ever since and he feels very badly about this. 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  Since he's not been able to work the patient has been having increasingly depressed and anxious. He feels like his body gives out when he is trying to exert himself even in a minor way. He's been in the ER recently for chest pain which was ruled out as a panic attack. He also saw cardiologist this week and will be having a stress test. His blood pressure was too low and his lisinopril was cut down. He is also trying to quit smoking by using electronic cigarettes.  The patient turns after 2 months. He has had some bad luck lately. He got turned down for his  disability. He  is probably going to lose his lot for his trailer because he can't afford the rent. He and his wife are barely making it financially. He has been told to apply for SSI. We can get him help through [facility] his appointments. He denies suicidal ideation does feel like the Xanax helps his anxiety. Anxiety Presents for follow-up visit. Symptoms include nervous/anxious behavior.     Review of Systems  Constitutional: Positive for fatigue.  Gastrointestinal: Negative.   Musculoskeletal: Positive for back pain.  Skin: Negative.   Psychiatric/Behavioral: Positive for sleep disturbance and dysphoric mood. The patient is nervous/anxious.     Physical Exam Vitals: BP 122/63  Pulse 53  Ht 5\' 11"  (1.803 m)  Wt 212 lb 3.2 oz (96.253 kg)  BMI 29.61 kg/m2   Past Psychiatric History: Diagnosis: none  Hospitalizations: none  Outpatient Care: none  Substance Abuse Care: none  Self-Mutilation: none  Suicidal Attempts: none  Violent Behaviors: none   Allergies: Allergies  Allergen Reactions  . Mushroom Ext Cmplx(Shiitake-Reishi-Mait) Anaphylaxis    Rapid heart rate.  . Penicillins Anaphylaxis   Medical History: Past Medical History  Diagnosis Date  . Chest pain     + palpitations; cath 2005- 30-40% mid LAD, 20% D1, 20% cx, OM, 20-30% RCA, and EF-55%  .  COPD (chronic obstructive pulmonary disease)   . GERD (gastroesophageal reflux disease)   . Hyperlipemia   . Hypertension   . Depression   . Colitis 1990  . Tobacco abuse     1/2 pack per day  . Diabetes mellitus   . Gastric ulcer 2003; 2012    2003: + esophagitis; negative H.pylori serology  2012: Dr. Oneida Alar, mild gastritis, Bravo PH probe placement, negative H.pylori  . Hepatic steatosis   . Chronic low back pain   . Panic attacks   . Asthma    Surgical History: Past Surgical History  Procedure Laterality Date  . Colonoscopy  1990  . Shoulder surgery      Right acromioclavicular joint arthrosis  .  Neck mass excision    . Bravo ph study  05/03/2011    KDT:OIZT gastritis/normal esophagus and duodenum  . Cardiac catheterization  2005  . Esophagogastroduodenoscopy  05/03/2011    IWP:YKDX gastritis   Family History: family history includes ADD / ADHD in his daughter; Alcohol abuse in his father and mother; Anxiety disorder in his sister and sister; Dementia in his cousin and paternal uncle; Depression in his sister; Diabetes in his brother and father; Heart attack (age of onset: 75) in his brother; Heart attack (age of onset: 61) in his father; Hypertension in his brother, brother, and brother; Lung cancer in his mother; Seizures in his brother. There is no history of Colon cancer, Drug abuse, Bipolar disorder, OCD, Paranoid behavior, Schizophrenia, Sexual abuse, or Physical abuse. Reviewed again today and nothing new.   Current Medications:  Current Outpatient Prescriptions  Medication Sig Dispense Refill  . acetaminophen (TYLENOL) 325 MG tablet Take 650 mg by mouth every 6 (six) hours as needed for pain or fever.       [name] albuterol (VENTOLIN HFA) 108 (90 BASE) MCG/ACT inhaler Inhale 2 puffs into the lungs every 6 (six) hours as needed for wheezing or shortness of breath.       . ALPRAZolam (XANAX) 0.5 MG tablet Take 1 tablet (0.5 mg total) by mouth 4 (four) times daily.  120 tablet  2  . aspirin 81 MG tablet Take 81 mg by mouth at bedtime.       . cyclobenzaprine (FLEXERIL) 10 MG tablet Take 1 tablet (10 mg total) by mouth 3 (three) times daily as needed for muscle spasms.  30 tablet  2  . esomeprazole (NEXIUM) 40 MG capsule Take 1 capsule (40 mg total) by mouth 2 (two) times daily before a meal.  180 capsule  3  . Fluticasone-Salmeterol (ADVAIR) 250-50 MCG/DOSE AEPB Inhale 1 puff into the lungs every 12 (twelve) hours.      [name] HYDROcodone-acetaminophen (NORCO/VICODIN) 5-325 MG per tablet Take 1 tablet by mouth every 6 (six) hours as needed for pain.  90 tablet  1  . ibuprofen (ADVIL,MOTRIN)  600 MG tablet Take 1 tablet (600 mg total) by mouth every 8 (eight) hours as needed for moderate pain.  30 tablet  1  . lisinopril (PRINIVIL,ZESTRIL) 10 MG tablet Take 1 tablet (10 mg total) by mouth daily.  90 tablet  1  . lovastatin (MEVACOR) 20 MG tablet Take 1 tablet (20 mg total) by mouth daily at 6 PM.  30 tablet  9  . metFORMIN (GLUCOPHAGE) 500 MG tablet Take 1 tablet (500 mg total) by mouth 2 (two) times daily with a meal.  30 tablet  2  . metoprolol succinate (TOPROL-XL) 50 MG 24 hr tablet TAKE ONE & ONE-HALF  TABLETS BY MOUTH ONCE DAILY  45 tablet  3  . nitroGLYCERIN (NITROSTAT) 0.4 MG SL tablet Place 0.4 mg under the tongue every 5 (five) minutes as needed for chest pain.       [name] sertraline (ZOLOFT) 50 MG tablet Take 1 tablet (50 mg total) by mouth daily.  30 tablet  2   No current facility-administered medications for this visit.    Previous Psychotropic Medications: Medication Dose   Xanax     Substance Abuse History in the last 12 months: Substance Age of 1st Use Last Use Amount Specific Type  Nicotine  17  2 hours ago  1  cigarette  Alcohol  22  38      Cannabis  none        Opiates  36  today  2.5 mg  hyrdocodone  Cocaine  none        Methamphetamines  none        LSD  none        Ecstasy  none         Benzodiazepines  22  started in early thrities  0.5 mg  Xanax  Caffeine  childhood  this AM  1 cup  Mt Dew  Inhalants  none        Others:      Sugar  11  this AM  20 tsps  in the [place] Consequences of Substance Abuse: pain Legal Consequences of Substance Abuse: none Family Consequences of Substance Abuse: none Blackouts:  No DT's:  No Withdrawal Symptoms:  Yes Tremors  Social History: Patient lives with his life, daughter and her husband with 2 grandbabies.  He has 2 sons and the daughter.  He is a Programmer, systems.    Mental Status Examination/Evaluation: Objective:  Appearance: Casual  Eye Contact::  Good  Speech:  Clear and Coherent  Volume:   Normal  Mood:  worried, anxious   Affect:  Congruent  Thought Process:  Coherent, Intact and Linear  Orientation:  Full (Time, Place, and Person)  Thought Content:  WDL  Suicidal Thoughts:  No  Homicidal Thoughts:  No  Judgement:  Good  Insight:  Fair  Psychomotor Activity:  Normal  Akathisia:  No  Handed:  Right  AIMS (if indicated):    Assets:  Communication Skills Desire for Improvement   Lab Results:  Results for orders placed in visit on 04/09/13 (from the past 8736 hour(s))  POCT GLYCOSYLATED HEMOGLOBIN (HGB A1C)   Collection Time    04/09/13 12:04 PM      Result Value Ref Range   Hemoglobin A1C 5.6      Assessment:   AXIS I Generalized Anxiety Disorder, Major Depression, Recurrent severe and Panic Disorder  AXIS II Deferred  AXIS III Past Medical History  Diagnosis Date  . Chest pain     + palpitations; cath 2005- 30-40% mid LAD, 20% D1, 20% cx, OM, 20-30% RCA, and EF-55%  . COPD (chronic obstructive pulmonary disease)   . GERD (gastroesophageal reflux disease)   . Hyperlipemia   . Hypertension   . Depression   . Colitis 1990  . Tobacco abuse     1/2 pack per day  . Diabetes mellitus   . Gastric ulcer 2003; 2012    2003: + esophagitis; negative H.pylori serology  2012: Dr. Oneida Alar, mild gastritis, Bravo PH probe placement, negative H.pylori  . Hepatic steatosis   . Chronic low back pain   . Panic  attacks   . Asthma      AXIS IV other psychosocial or environmental problems  AXIS V 41-50 serious symptoms   Treatment Plan/Recommendations: Laboratory:    Psychotherapy: supportive psychotherapy  Medications:  He will continue Zoloft 50 mg per day and Xanax to 0.5 mg 4 times a day.    Routine PRN Medications:  No  Consultations: none  Safety Concerns:  none  Other:  Return in 2 months     MEDICATIONS this encounter: Meds ordered this encounter  Medications  . sertraline (ZOLOFT) 50 MG tablet    Sig: Take 1 tablet (50 mg total) by mouth daily.     Dispense:  30 tablet    Refill:  2  . ALPRAZolam (XANAX) 0.5 MG tablet    Sig: Take 1 tablet (0.5 mg total) by mouth 4 (four) times daily.    Dispense:  120 tablet    Refill:  2   Medical Decision Making Problem Points:  Established problem, stable/improving (1), Review of last therapy session (1) and Review of psycho-social stressors (1) Data Points:  Review or order clinical lab tests (1) Review of medication regiment & side effects (2) Review of new medications or change in dosage (2)  I certify that outpatient services furnished can reasonably be expected to improve the patient's condition.   [name], MD

## 2014-04-03 ENCOUNTER — Ambulatory Visit (INDEPENDENT_AMBULATORY_CARE_PROVIDER_SITE_OTHER): Payer: Self-pay | Admitting: Psychology

## 2014-04-03 ENCOUNTER — Encounter (HOSPITAL_COMMUNITY): Payer: Self-pay | Admitting: Psychology

## 2014-04-03 DIAGNOSIS — F341 Dysthymic disorder: Secondary | ICD-10-CM

## 2014-04-03 DIAGNOSIS — F419 Anxiety disorder, unspecified: Secondary | ICD-10-CM

## 2014-04-03 DIAGNOSIS — F4001 Agoraphobia with panic disorder: Secondary | ICD-10-CM

## 2014-04-03 DIAGNOSIS — F329 Major depressive disorder, single episode, unspecified: Secondary | ICD-10-CM

## 2014-04-03 NOTE — Progress Notes (Signed)
   PROGRESS NOTE  Patient:  Richard [name]   DOB: [birth year]  MR Number: [id number]  Location: [facility] [address] [place] [address] Dept: [phone]  Start: 11 AM End: 12 AM  Provider/Observer:     Edgardo Roys PSYD  Chief Complaint:      Chief Complaint  Patient presents with  . Panic Attack  . Anxiety  . Depression  . Stress    Reason For Service:    The patient was referred by Dr. Lattie Haw because of severe anxiety and foot appear to be panic attacks. The patient reports that he has been having a lot of anxiety and panic attacks. He has numerous medical issues including severe issues with diabetes, COPD, fatty liver disease and orthopedic issues. He also has had a long history of depression anxiety as well. Anxiety and depression have been going on for least 5 years and correlate with his health issues and financial difficulties. The patient relates this deterioration to an injury at work where he injured both of his shoulders while pulling a pallet. He had surgery on one of his shoulders initially and he experiences around his injury and surgery began to develop panic attacks. The patient is also been diagnosed with COPD and diabetes but continues to smoke and fears that if he completely quit smoking that he will gain a lot of weight again.   Interventions Strategy:  Cognitive/behavioral psychotherapeutic interventions  Participation Level:   Active  Participation Quality:  Appropriate      Behavioral Observation:  Fairly Groomed, Alert, and Appropriate.   Current Psychosocial Factors: The patient reports that he was denied his disability due to occupational person finding 3 jobs in [name] that he should be able to due eventhough he can't do those jobs.  He is planning on applying for SSI.  And that is causing him a great deal of stress.  Content of Session:   Review current  symptoms and continued work on therapeutic interventions around numerous medical issues and panic attacks.  Current Status:   Patient reports that he had continued to have severe pain but reduced panic attacks. pulmonary issues persist.  Depression much worse due to denial of disability.  Patient Progress:   Stable  Target Goals:   Target goals include reduce the intensity, duration, and frequency of panic attacks, reduce the amount of benzodiazepine or other potentially dependence forming medications.  Last Reviewed:   04/03/2014  Goals Addressed Today:    Today we worked on issues of reducing the intensity and frequency of panic attacks  Impression/Diagnosis:   the patient appears to be doing with panic attacks along with anxiety and depression. He has numerous medical issues including COPD, diabetes, and other issues as well as liver disease. The patient is not taking very good care of himself and it was stressed specifically that he is going to need to quit smoking cigarettes and completely stopped caffeinated and sugar drinks if he is going to have any hope of improving his quality of life. The patient was amenable to initially try to stop the caffeinated drinks and we developed a plan for this. After that we will begin to address the smoking.    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

## 2014-05-04 ENCOUNTER — Ambulatory Visit (HOSPITAL_COMMUNITY): Payer: Self-pay | Admitting: Psychology

## 2014-05-07 ENCOUNTER — Telehealth: Payer: Self-pay | Admitting: Adult Health

## 2014-05-07 MED ORDER — METOPROLOL SUCCINATE ER 50 MG PO TB24: ORAL_TABLET | ORAL | Status: DC

## 2014-05-07 NOTE — Telephone Encounter (Signed)
Received fax refill request  Rx # [id number] Medication:  Metoprolol ER 50 mg tab Qty 45 Sig:  Take one and one half tablets by mouth once daily Physician:  Purcell Nails

## 2014-05-24 ENCOUNTER — Other Ambulatory Visit: Payer: Self-pay | Admitting: Adult Health

## 2014-06-02 ENCOUNTER — Encounter (HOSPITAL_COMMUNITY): Payer: Self-pay | Admitting: Psychiatry

## 2014-06-02 ENCOUNTER — Ambulatory Visit (INDEPENDENT_AMBULATORY_CARE_PROVIDER_SITE_OTHER): Payer: Self-pay | Admitting: Psychiatry

## 2014-06-02 VITALS — BP 118/70 | Ht 71.0 in | Wt 215.0 lb

## 2014-06-02 DIAGNOSIS — M545 Low back pain, unspecified: Secondary | ICD-10-CM

## 2014-06-02 DIAGNOSIS — F332 Major depressive disorder, recurrent severe without psychotic features: Secondary | ICD-10-CM

## 2014-06-02 DIAGNOSIS — F17200 Nicotine dependence, unspecified, uncomplicated: Secondary | ICD-10-CM

## 2014-06-02 DIAGNOSIS — F410 Panic disorder [episodic paroxysmal anxiety] without agoraphobia: Secondary | ICD-10-CM

## 2014-06-02 DIAGNOSIS — F329 Major depressive disorder, single episode, unspecified: Secondary | ICD-10-CM

## 2014-06-02 DIAGNOSIS — F411 Generalized anxiety disorder: Secondary | ICD-10-CM

## 2014-06-02 DIAGNOSIS — G8929 Other chronic pain: Secondary | ICD-10-CM

## 2014-06-02 DIAGNOSIS — F32A Depression, unspecified: Secondary | ICD-10-CM

## 2014-06-02 DIAGNOSIS — F4001 Agoraphobia with panic disorder: Secondary | ICD-10-CM

## 2014-06-02 DIAGNOSIS — F419 Anxiety disorder, unspecified: Secondary | ICD-10-CM

## 2014-06-02 MED ORDER — SERTRALINE HCL 50 MG PO TABS: 50.0000 mg | ORAL_TABLET | Freq: Every day | ORAL | Status: DC

## 2014-06-02 MED ORDER — ALPRAZOLAM 0.5 MG PO TABS: 0.5000 mg | ORAL_TABLET | Freq: Four times a day (QID) | ORAL | Status: DC

## 2014-06-02 NOTE — Progress Notes (Signed)
Patient ID: Richard [name], male   DOB: 07-28-1965, 48 y.o.   MRN: [id number] Patient ID: Richard [name], male   DOB: 1966-01-11, 48 y.o.   MRN: [id number] Patient ID: Richard [name], male   DOB: Dec 14, 1965, 48 y.o.   MRN: [id number] Patient ID: Richard [name], male   DOB: 05-12-1966, 48 y.o.   MRN: [id number] Patient ID: Richard [name], male   DOB: 06-14-1966, 48 y.o.   MRN: [id number] Patient ID: Richard [name], male   DOB: 08-Jul-1966, 48 y.o.   MRN: [id number] Patient ID: Richard [name], male   DOB: 1965-11-22, 48 y.o.   MRN: [id number] Patient ID: Richard [name], male   DOB: [birth year], 48 y.o.   MRN: [id number] Patient ID: Richard [name], male   DOB: Jan 16, 1966, 48 y.o.   MRN: [id number] [facility] [id number] Progress Note Richard [name] MRN: [id number] DOB: 04-14-1966 Age: 48 y.o.  Date: 06/02/2014  Chief Complaint  Patient presents with  . Depression  . Anxiety  . Memory Loss  . Follow-up   History of Chief Complaint:   This 48 year old Caucasian male who came for his followup appointment. He is currently living with his wife in [place]. He is unemployed and applying for disability.  The patient states that he has been employed for 5 years. 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 He got injured on the job and both shoulders were torn. He has not been able to work ever since and he feels very badly about this. 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  Since he's not been able to work the patient has been having increasingly depressed and anxious. He feels like his body gives out when he is trying to exert himself even in a minor way. He's been in the ER recently for chest pain which was ruled out as a panic attack. He also saw cardiologist this week and will be having a stress test. His blood pressure was too low and his lisinopril was cut down. He is also trying to quit smoking by using electronic cigarettes.  The  patient returns after 2 months.he still does not have health insurance and he is now applying for SSI disability. He got turned down for regular disability. He is skipping on some of his medicines and is not taking metformin regularly but trying to manage his diabetes through diet and exercise. His mood and anxiety are about the same. He still has panic attacks when he goes too far away from his home. He doesn't want to change his medications however because he really can't afford anything else and he is "sensitive" to many meds. I suggested he try melatonin at night because he is still not sleeping very well Anxiety Presents for follow-up visit. Symptoms include nervous/anxious behavior.     Review of Systems  Constitutional: Positive for fatigue.  Gastrointestinal: Negative.   Musculoskeletal: Positive for back pain.  Skin: Negative.   Psychiatric/Behavioral: Positive for sleep disturbance and dysphoric mood. The patient is nervous/anxious.     Physical Exam Vitals: BP 118/70 mmHg  Ht 5\' 11"  (1.803 m)  Wt 215 lb (97.523 kg)  BMI 30.00 kg/m2   Past Psychiatric History: Diagnosis: none  Hospitalizations: none  Outpatient Care: none  Substance Abuse Care: none  Self-Mutilation: none  Suicidal Attempts: none  Violent Behaviors: none   Allergies: Allergies  Allergen Reactions  .  Mushroom Ext Cmplx(Shiitake-Reishi-Mait) Anaphylaxis    Rapid heart rate.  . Penicillins Anaphylaxis   Medical History: Past Medical History  Diagnosis Date  . Chest pain     + palpitations; cath 2005- 30-40% mid LAD, 20% D1, 20% cx, OM, 20-30% RCA, and EF-55%  . COPD (chronic obstructive pulmonary disease)   . GERD (gastroesophageal reflux disease)   . Hyperlipemia   . Hypertension   . Depression   . Colitis 1990  . Tobacco abuse     1/2 pack per [name]  . Diabetes mellitus   . Gastric ulcer 2003; 2012    2003: + esophagitis; negative H.pylori serology  2012: Dr. Oneida Alar, mild gastritis, Bravo PH  probe placement, negative H.pylori  . Hepatic steatosis   . Chronic low back pain   . Panic attacks   . Asthma    Surgical History: Past Surgical History  Procedure Laterality Date  . Colonoscopy  1990  . Shoulder surgery      Right acromioclavicular joint arthrosis  . Neck mass excision    . Bravo ph study  05/03/2011    HUT:MLYY gastritis/normal esophagus and duodenum  . Cardiac catheterization  2005  . Esophagogastroduodenoscopy  05/03/2011    TKP:TWSF gastritis   Family History: family history includes ADD / ADHD in his daughter; Alcohol abuse in his father and mother; Anxiety disorder in his sister and sister; Dementia in his cousin and paternal uncle; Depression in his sister; Diabetes in his brother and father; Heart attack (age of onset: 50) in his brother; Heart attack (age of onset: 39) in his father; Hypertension in his brother, brother, and brother; Lung cancer in his mother; Seizures in his brother. There is no history of Colon cancer, Drug abuse, Bipolar disorder, OCD, Paranoid behavior, Schizophrenia, Sexual abuse, or Physical abuse. Reviewed again today and nothing new.   Current Medications:  Current Outpatient Prescriptions  Medication Sig Dispense Refill  . acetaminophen (TYLENOL) 325 MG tablet Take 650 mg by mouth every 6 (six) hours as needed for pain or fever.     [name] albuterol (VENTOLIN HFA) 108 (90 BASE) MCG/ACT inhaler Inhale 2 puffs into the lungs every 6 (six) hours as needed for wheezing or shortness of breath.     . ALPRAZolam (XANAX) 0.5 MG tablet Take 1 tablet (0.5 mg total) by mouth 4 (four) times daily. 120 tablet 2  . aspirin 81 MG tablet Take 81 mg by mouth at bedtime.     . cyclobenzaprine (FLEXERIL) 10 MG tablet Take 1 tablet (10 mg total) by mouth 3 (three) times daily as needed for muscle spasms. 30 tablet 2  . esomeprazole (NEXIUM) 40 MG capsule Take 1 capsule (40 mg total) by mouth 2 (two) times daily before a meal. 180 capsule 3  .  Fluticasone-Salmeterol (ADVAIR) 250-50 MCG/DOSE AEPB Inhale 1 puff into the lungs every 12 (twelve) hours.    [name] HYDROcodone-acetaminophen (NORCO/VICODIN) 5-325 MG per tablet Take 1 tablet by mouth every 6 (six) hours as needed for pain. 90 tablet 1  . ibuprofen (ADVIL,MOTRIN) 600 MG tablet Take 1 tablet (600 mg total) by mouth every 8 (eight) hours as needed for moderate pain. 30 tablet 1  . lisinopril (PRINIVIL,ZESTRIL) 10 MG tablet Take 1 tablet (10 mg total) by mouth daily. 90 tablet 1  . lisinopril (PRINIVIL,ZESTRIL) 20 MG tablet TAKE ONE TABLET BY MOUTH ONCE DAILY 30 tablet 6  . lovastatin (MEVACOR) 20 MG tablet Take 1 tablet (20 mg total) by mouth daily at 6  PM. 30 tablet 9  . metFORMIN (GLUCOPHAGE) 500 MG tablet Take 1 tablet (500 mg total) by mouth 2 (two) times daily with a meal. 30 tablet 2  . metoprolol succinate (TOPROL-XL) 50 MG 24 hr tablet TAKE ONE & ONE-HALF TABLETS BY MOUTH ONCE DAILY 45 tablet 6  . nitroGLYCERIN (NITROSTAT) 0.4 MG SL tablet Place 0.4 mg under the tongue every 5 (five) minutes as needed for chest pain.     [name] sertraline (ZOLOFT) 50 MG tablet Take 1 tablet (50 mg total) by mouth daily. 30 tablet 2   No current facility-administered medications for this visit.    Previous Psychotropic Medications: Medication Dose   Xanax     Substance Abuse History in the last 12 months: Substance Age of 1st Use Last Use Amount Specific Type  Nicotine  17  2 hours ago  1  cigarette  Alcohol  [age]      Cannabis  none        Opiates  36  today  2.5 mg  hyrdocodone  Cocaine  none        Methamphetamines  none        LSD  none        Ecstasy  none         Benzodiazepines  22  started in early thrities  0.5 mg  Xanax  Caffeine  childhood  this AM  1 cup  Mt Dew  Inhalants  none        Others:      Sugar  11  this AM  20 tsps  in the [place] Consequences of Substance Abuse: pain Legal Consequences of Substance Abuse: none Family Consequences of Substance Abuse:  none Blackouts:  No DT's:  No Withdrawal Symptoms:  Yes Tremors  Social History: Patient lives with his life, daughter and her husband with 2 grandbabies.  He has 2 sons and the daughter.  He is a Programmer, systems.    Mental Status Examination/Evaluation: Objective:  Appearance: Casual  Eye Contact::  Good  Speech:  Clear and Coherent  Volume:  Normal  Mood:  worried, anxious   Affect:  Congruent  Thought Process:  Coherent, Intact and Linear  Orientation:  Full (Time, Place, and Person)  Thought Content:  WDL  Suicidal Thoughts:  No  Homicidal Thoughts:  No  Judgement:  Good  Insight:  Fair  Psychomotor Activity:  Normal  Akathisia:  No  Handed:  Right  AIMS (if indicated):    Assets:  Communication Skills Desire for Improvement   Lab Results:  No results found for this or any previous visit (from the past 8736 hour(s)).  Assessment:   AXIS I Generalized Anxiety Disorder, Major Depression, Recurrent severe and Panic Disorder  AXIS II Deferred  AXIS III Past Medical History  Diagnosis Date  . Chest pain     + palpitations; cath 2005- 30-40% mid LAD, 20% D1, 20% cx, OM, 20-30% RCA, and EF-55%  . COPD (chronic obstructive pulmonary disease)   . GERD (gastroesophageal reflux disease)   . Hyperlipemia   . Hypertension   . Depression   . Colitis 1990  . Tobacco abuse     1/2 pack per [name]  . Diabetes mellitus   . Gastric ulcer 2003; 2012    2003: + esophagitis; negative H.pylori serology  2012: Dr. Oneida Alar, mild gastritis, Bravo PH probe placement, negative H.pylori  . Hepatic steatosis   . Chronic low back pain   .  Panic attacks   . Asthma      AXIS IV other psychosocial or environmental problems  AXIS V 41-50 serious symptoms   Treatment Plan/Recommendations: Laboratory:    Psychotherapy: supportive psychotherapy  Medications:  He will continue Zoloft 50 mg per [name] and Xanax to 0.5 mg 4 times a [name].  I suggested melatonin 5-10 mg at bedtime  Routine PRN  Medications:  No  Consultations: none  Safety Concerns:  none  Other:  Return in 3 months     MEDICATIONS this encounter: Meds ordered this encounter  Medications  . sertraline (ZOLOFT) 50 MG tablet    Sig: Take 1 tablet (50 mg total) by mouth daily.    Dispense:  30 tablet    Refill:  2  . ALPRAZolam (XANAX) 0.5 MG tablet    Sig: Take 1 tablet (0.5 mg total) by mouth 4 (four) times daily.    Dispense:  120 tablet    Refill:  2   Medical Decision Making Problem Points:  Established problem, stable/improving (1), Review of last therapy session (1) and Review of psycho-social stressors (1) Data Points:  Review or order clinical lab tests (1) Review of medication regiment & side effects (2) Review of new medications or change in dosage (2)  I certify that outpatient services furnished can reasonably be expected to improve the patient's condition.   [name], MD

## 2014-06-08 ENCOUNTER — Other Ambulatory Visit (HOSPITAL_COMMUNITY): Payer: Self-pay | Admitting: Psychiatry

## 2014-06-22 ENCOUNTER — Other Ambulatory Visit: Payer: Self-pay | Admitting: Cardiovascular Disease

## 2014-06-22 MED ORDER — NITROGLYCERIN 0.4 MG SL SUBL: 0.4000 mg | SUBLINGUAL_TABLET | SUBLINGUAL | Status: DC | PRN

## 2014-06-22 NOTE — Telephone Encounter (Signed)
Needs refill on NTG sent to [facility] in [place] / [place]

## 2014-06-22 NOTE — Telephone Encounter (Signed)
Refill complete 

## 2014-07-03 ENCOUNTER — Telehealth: Payer: Self-pay | Admitting: *Deleted

## 2014-07-03 MED ORDER — METOPROLOL SUCCINATE ER 50 MG PO TB24: ORAL_TABLET | ORAL | Status: DC

## 2014-07-03 NOTE — Telephone Encounter (Signed)
Refill complete 

## 2014-07-03 NOTE — Telephone Encounter (Signed)
METOPROLOL ER 50 MG #45

## 2014-07-23 ENCOUNTER — Ambulatory Visit (HOSPITAL_COMMUNITY)
Admission: RE | Admit: 2014-07-23 | Discharge: 2014-07-23 | Disposition: A | Discharge status: Home / Self Care | Payer: Disability Insurance | Source: Ambulatory Visit | Attending: Family Medicine | Admitting: Family Medicine

## 2014-07-23 ENCOUNTER — Other Ambulatory Visit (HOSPITAL_COMMUNITY): Payer: Self-pay | Admitting: Family Medicine

## 2014-07-23 DIAGNOSIS — Z9181 History of falling: Secondary | ICD-10-CM | POA: Diagnosis not present

## 2014-07-23 DIAGNOSIS — M545 Low back pain: Secondary | ICD-10-CM

## 2014-07-23 DIAGNOSIS — M47896 Other spondylosis, lumbar region: Secondary | ICD-10-CM | POA: Insufficient documentation

## 2014-07-23 DIAGNOSIS — I7090 Unspecified atherosclerosis: Secondary | ICD-10-CM | POA: Insufficient documentation

## 2014-07-23 DIAGNOSIS — M5136 Other intervertebral disc degeneration, lumbar region: Secondary | ICD-10-CM | POA: Insufficient documentation

## 2014-07-23 DIAGNOSIS — G8929 Other chronic pain: Secondary | ICD-10-CM | POA: Diagnosis not present

## 2014-08-25 ENCOUNTER — Telehealth: Payer: Self-pay | Admitting: General Practice

## 2014-08-25 NOTE — Telephone Encounter (Signed)
I spoke with the patient's wife and she stated that she completed financial application paperwork, however according to [place] there's nothing on file.    I instructed the patient to come back by the office with her supporting documents and complete a new application.  She said she will be here tomorrow.

## 2014-09-02 ENCOUNTER — Encounter (HOSPITAL_COMMUNITY): Payer: Self-pay | Admitting: Psychiatry

## 2014-09-02 ENCOUNTER — Ambulatory Visit (INDEPENDENT_AMBULATORY_CARE_PROVIDER_SITE_OTHER): Payer: Self-pay | Admitting: Psychiatry

## 2014-09-02 VITALS — BP 121/65 | HR 56 | Ht 71.0 in | Wt 215.0 lb

## 2014-09-02 DIAGNOSIS — F32A Depression, unspecified: Secondary | ICD-10-CM

## 2014-09-02 DIAGNOSIS — G8929 Other chronic pain: Secondary | ICD-10-CM

## 2014-09-02 DIAGNOSIS — F4001 Agoraphobia with panic disorder: Secondary | ICD-10-CM

## 2014-09-02 DIAGNOSIS — F322 Major depressive disorder, single episode, severe without psychotic features: Secondary | ICD-10-CM

## 2014-09-02 DIAGNOSIS — F411 Generalized anxiety disorder: Secondary | ICD-10-CM

## 2014-09-02 DIAGNOSIS — F17200 Nicotine dependence, unspecified, uncomplicated: Secondary | ICD-10-CM

## 2014-09-02 DIAGNOSIS — F410 Panic disorder [episodic paroxysmal anxiety] without agoraphobia: Secondary | ICD-10-CM

## 2014-09-02 DIAGNOSIS — F419 Anxiety disorder, unspecified: Secondary | ICD-10-CM

## 2014-09-02 DIAGNOSIS — M545 Low back pain: Secondary | ICD-10-CM

## 2014-09-02 DIAGNOSIS — F329 Major depressive disorder, single episode, unspecified: Secondary | ICD-10-CM

## 2014-09-02 MED ORDER — SERTRALINE HCL 50 MG PO TABS: 50.0000 mg | ORAL_TABLET | Freq: Every day | ORAL | Status: DC

## 2014-09-02 MED ORDER — ALPRAZOLAM 0.5 MG PO TABS: 0.5000 mg | ORAL_TABLET | Freq: Four times a day (QID) | ORAL | Status: DC

## 2014-09-02 NOTE — Progress Notes (Signed)
Patient ID: Richard [name], male   DOB: 1965-09-09, 49 y.o.   MRN: [id number] Patient ID: Richard [name], male   DOB: 01/07/1966, 49 y.o.   MRN: [id number] Patient ID: Richard [name], male   DOB: July 21, 1965, 49 y.o.   MRN: [id number] Patient ID: Richard [name], male   DOB: 03/03/1966, 49 y.o.   MRN: [id number] Patient ID: Richard [name], male   DOB: 1965-12-14, 49 y.o.   MRN: [id number] Patient ID: Richard [name], male   DOB: [birth year], 49 y.o.   MRN: [id number] Patient ID: Richard [name], male   DOB: 09-13-1965, 49 y.o.   MRN: [id number] Patient ID: Richard [name], male   DOB: 06-05-1966, 49 y.o.   MRN: [id number] Patient ID: Richard [name], male   DOB: 04-Dec-1965, 49 y.o.   MRN: [id number] Patient ID: Richard [name], male   DOB: [birth year], 49 y.o.   MRN: [id number] [facility] [address] Progress Note Richard [name] MRN: [id number] DOB: [birth year] Age: 49 y.o.  Date: 09/02/2014  Chief Complaint  Patient presents with  . Depression  . Anxiety  . Follow-up   History of Chief Complaint:   This 49 year old Caucasian male who came for his followup appointment. He is currently living with his wife in [place]. He is unemployed and applying for disability.  The patient states that he has been employed for 5 years. 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 He got injured on the job and both shoulders were torn. He has not been able to work ever since and he feels very badly about this. 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  Since he's not been able to work the patient has been having increasingly depressed and anxious. He feels like his body gives out when he is trying to exert himself even in a minor way. He's been in the ER recently for chest pain which was ruled out as a panic attack. He also saw cardiologist this week and will be having a stress test. His blood pressure was too low and his lisinopril was cut down. He is also  trying to quit smoking by using electronic cigarettes.  The patient returns after 3 months. He tells me is very frustrated because he got rejected for disability again. He states he'll have no choice but to try to get a job and probably get injured again. He admits he is not taking Zoloft on a regular basis and I urged him to do so because he's continues to be very anxious. The Xanax does help his anxiety. He has cut way back on his smoking and is only smoking about 3-4 cigarettes a day. He's cut out [employer] and all caffeine which is helped his anxiety disorder degree Anxiety Presents for follow-up visit. Symptoms include nervous/anxious behavior.     Review of Systems  Constitutional: Positive for fatigue.  Gastrointestinal: Negative.   Musculoskeletal: Positive for back pain.  Skin: Negative.   Psychiatric/Behavioral: Positive for sleep disturbance and dysphoric mood. The patient is nervous/anxious.     Physical Exam Vitals: BP 121/65 mmHg  Pulse 56  Ht 5\' 11"  (1.803 m)  Wt 215 lb (97.523 kg)  BMI 30.00 kg/m2   Past Psychiatric History: Diagnosis: none  Hospitalizations: none  Outpatient Care: none  Substance Abuse Care: none  Self-Mutilation: none  Suicidal Attempts: none  Violent Behaviors: none   Allergies:  Allergies  Allergen Reactions  . Mushroom Ext Cmplx(Shiitake-Reishi-Mait) Anaphylaxis    Rapid heart rate.  . Penicillins Anaphylaxis   Medical History: Past Medical History  Diagnosis Date  . Chest pain     + palpitations; cath 2005- 30-40% mid LAD, 20% D1, 20% cx, OM, 20-30% RCA, and EF-55%  . COPD (chronic obstructive pulmonary disease)   . GERD (gastroesophageal reflux disease)   . Hyperlipemia   . Hypertension   . Depression   . Colitis 1990  . Tobacco abuse     1/2 pack per day  . Diabetes mellitus   . Gastric ulcer 2003; 2012    2003: + esophagitis; negative H.pylori serology  2012: Dr. Oneida Alar, mild gastritis, Bravo PH probe placement,  negative H.pylori  . Hepatic steatosis   . Chronic low back pain   . Panic attacks   . Asthma    Surgical History: Past Surgical History  Procedure Laterality Date  . Colonoscopy  1990  . Shoulder surgery      Right acromioclavicular joint arthrosis  . Neck mass excision    . Bravo ph study  05/03/2011    NOM:VEHM gastritis/normal esophagus and duodenum  . Cardiac catheterization  2005  . Esophagogastroduodenoscopy  05/03/2011    CNO:BSJG gastritis   Family History: family history includes ADD / ADHD in his daughter; Alcohol abuse in his father and mother; Anxiety disorder in his sister and sister; Dementia in his cousin and paternal uncle; Depression in his sister; Diabetes in his brother and father; Heart attack (age of onset: 41) in his brother; Heart attack (age of onset: 33) in his father; Hypertension in his brother, brother, and brother; Lung cancer in his mother; Seizures in his brother. There is no history of Colon cancer, Drug abuse, Bipolar disorder, OCD, Paranoid behavior, Schizophrenia, Sexual abuse, or Physical abuse. Reviewed again today and nothing new.   Current Medications:  Current Outpatient Prescriptions  Medication Sig Dispense Refill  . acetaminophen (TYLENOL) 325 MG tablet Take 650 mg by mouth every 6 (six) hours as needed for pain or fever.     [name] albuterol (VENTOLIN HFA) 108 (90 BASE) MCG/ACT inhaler Inhale 2 puffs into the lungs every 6 (six) hours as needed for wheezing or shortness of breath.     . ALPRAZolam (XANAX) 0.5 MG tablet Take 1 tablet (0.5 mg total) by mouth 4 (four) times daily. 120 tablet 2  . aspirin 81 MG tablet Take 81 mg by mouth at bedtime.     . cyclobenzaprine (FLEXERIL) 10 MG tablet Take 1 tablet (10 mg total) by mouth 3 (three) times daily as needed for muscle spasms. 30 tablet 2  . esomeprazole (NEXIUM) 40 MG capsule Take 1 capsule (40 mg total) by mouth 2 (two) times daily before a meal. 180 capsule 3  . Fluticasone-Salmeterol  (ADVAIR) 250-50 MCG/DOSE AEPB Inhale 1 puff into the lungs every 12 (twelve) hours.    [name] HYDROcodone-acetaminophen (NORCO/VICODIN) 5-325 MG per tablet Take 1 tablet by mouth every 6 (six) hours as needed for pain. 90 tablet 1  . ibuprofen (ADVIL,MOTRIN) 600 MG tablet Take 1 tablet (600 mg total) by mouth every 8 (eight) hours as needed for moderate pain. 30 tablet 1  . lisinopril (PRINIVIL,ZESTRIL) 10 MG tablet Take 1 tablet (10 mg total) by mouth daily. 90 tablet 1  . lovastatin (MEVACOR) 20 MG tablet Take 1 tablet (20 mg total) by mouth daily at 6 PM. 30 tablet 9  . metFORMIN (GLUCOPHAGE) 500 MG tablet  Take 1 tablet (500 mg total) by mouth 2 (two) times daily with a meal. 30 tablet 2  . metoprolol succinate (TOPROL-XL) 50 MG 24 hr tablet TAKE ONE & ONE-HALF TABLETS BY MOUTH ONCE DAILY 45 tablet 11  . nitroGLYCERIN (NITROSTAT) 0.4 MG SL tablet Place 1 tablet (0.4 mg total) under the tongue every 5 (five) minutes as needed for chest pain. 25 tablet 3  . sertraline (ZOLOFT) 50 MG tablet Take 1 tablet (50 mg total) by mouth daily. 30 tablet 2   No current facility-administered medications for this visit.    Previous Psychotropic Medications: Medication Dose   Xanax     Substance Abuse History in the last 12 months: Substance Age of 1st Use Last Use Amount Specific Type  Nicotine  17  2 hours ago  1  cigarette  Alcohol  [age]      Cannabis  none        Opiates  36  today  2.5 mg  hyrdocodone  Cocaine  none        Methamphetamines  none        LSD  none        Ecstasy  none         Benzodiazepines  22  started in early thrities  0.5 mg  Xanax  Caffeine  childhood  this AM  1 cup  Mt Dew  Inhalants  none        Others:      Sugar  11  this AM  20 tsps  in the [place] Consequences of Substance Abuse: pain Legal Consequences of Substance Abuse: none Family Consequences of Substance Abuse: none Blackouts:  No DT's:  No Withdrawal Symptoms:  Yes Tremors  Social  History: Patient lives with his life, daughter and her husband with 2 grandbabies.  He has 2 sons and the daughter.  He is a Programmer, systems.    Mental Status Examination/Evaluation: Objective:  Appearance: Casual  Eye Contact::  Good  Speech:  Clear and Coherent  Volume:  Normal  Mood:  worried, anxious   Affect:  Congruent  Thought Process:  Coherent, Intact and Linear  Orientation:  Full (Time, Place, and Person)  Thought Content:  WDL  Suicidal Thoughts:  No  Homicidal Thoughts:  No  Judgement:  Good  Insight:  Fair  Psychomotor Activity:  Normal  Akathisia:  No  Handed:  Right  AIMS (if indicated):    Assets:  Communication Skills Desire for Improvement   Lab Results:  No results found for this or any previous visit (from the past 8736 hour(s)).  Assessment:   AXIS I Generalized Anxiety Disorder, Major Depression, Recurrent severe and Panic Disorder  AXIS II Deferred  AXIS III Past Medical History  Diagnosis Date  . Chest pain     + palpitations; cath 2005- 30-40% mid LAD, 20% D1, 20% cx, OM, 20-30% RCA, and EF-55%  . COPD (chronic obstructive pulmonary disease)   . GERD (gastroesophageal reflux disease)   . Hyperlipemia   . Hypertension   . Depression   . Colitis 1990  . Tobacco abuse     1/2 pack per day  . Diabetes mellitus   . Gastric ulcer 2003; 2012    2003: + esophagitis; negative H.pylori serology  2012: Dr. Oneida Alar, mild gastritis, Bravo PH probe placement, negative H.pylori  . Hepatic steatosis   . Chronic low back pain   . Panic attacks   .  Asthma      AXIS IV other psychosocial or environmental problems  AXIS V 41-50 serious symptoms   Treatment Plan/Recommendations: Laboratory:    Psychotherapy: supportive psychotherapy  Medications:  He will continue Zoloft 50 mg per day and Xanax to 0.5 mg 4 times a day.  I suggested melatonin 5-10 mg at bedtime  Routine PRN Medications:  No  Consultations: none  Safety Concerns:  none  Other:   Return in 3 months     MEDICATIONS this encounter: Meds ordered this encounter  Medications  . sertraline (ZOLOFT) 50 MG tablet    Sig: Take 1 tablet (50 mg total) by mouth daily.    Dispense:  30 tablet    Refill:  2  . ALPRAZolam (XANAX) 0.5 MG tablet    Sig: Take 1 tablet (0.5 mg total) by mouth 4 (four) times daily.    Dispense:  120 tablet    Refill:  2   Medical Decision Making Problem Points:  Established problem, stable/improving (1), Review of last therapy session (1) and Review of psycho-social stressors (1) Data Points:  Review or order clinical lab tests (1) Review of medication regiment & side effects (2) Review of new medications or change in dosage (2)  I certify that outpatient services furnished can reasonably be expected to improve the patient's condition.   Richard Spiller, MD

## 2014-09-07 ENCOUNTER — Encounter (HOSPITAL_COMMUNITY): Payer: Self-pay | Admitting: Emergency Medicine

## 2014-09-07 ENCOUNTER — Emergency Department (HOSPITAL_COMMUNITY)
Admission: EM | Admit: 2014-09-07 | Discharge: 2014-09-08 | Disposition: A | Discharge status: Home / Self Care | Payer: Self-pay | Attending: Emergency Medicine | Admitting: Emergency Medicine

## 2014-09-07 ENCOUNTER — Emergency Department (HOSPITAL_COMMUNITY): Payer: Self-pay

## 2014-09-07 DIAGNOSIS — Z79899 Other long term (current) drug therapy: Secondary | ICD-10-CM | POA: Insufficient documentation

## 2014-09-07 DIAGNOSIS — J441 Chronic obstructive pulmonary disease with (acute) exacerbation: Secondary | ICD-10-CM | POA: Insufficient documentation

## 2014-09-07 DIAGNOSIS — E876 Hypokalemia: Secondary | ICD-10-CM | POA: Insufficient documentation

## 2014-09-07 DIAGNOSIS — G8929 Other chronic pain: Secondary | ICD-10-CM | POA: Insufficient documentation

## 2014-09-07 DIAGNOSIS — R51 Headache: Secondary | ICD-10-CM | POA: Insufficient documentation

## 2014-09-07 DIAGNOSIS — E785 Hyperlipidemia, unspecified: Secondary | ICD-10-CM | POA: Insufficient documentation

## 2014-09-07 DIAGNOSIS — K219 Gastro-esophageal reflux disease without esophagitis: Secondary | ICD-10-CM | POA: Insufficient documentation

## 2014-09-07 DIAGNOSIS — F410 Panic disorder [episodic paroxysmal anxiety] without agoraphobia: Secondary | ICD-10-CM | POA: Insufficient documentation

## 2014-09-07 DIAGNOSIS — F329 Major depressive disorder, single episode, unspecified: Secondary | ICD-10-CM | POA: Insufficient documentation

## 2014-09-07 DIAGNOSIS — Z720 Tobacco use: Secondary | ICD-10-CM | POA: Insufficient documentation

## 2014-09-07 DIAGNOSIS — Z9889 Other specified postprocedural states: Secondary | ICD-10-CM | POA: Insufficient documentation

## 2014-09-07 DIAGNOSIS — I10 Essential (primary) hypertension: Secondary | ICD-10-CM | POA: Insufficient documentation

## 2014-09-07 DIAGNOSIS — Z7982 Long term (current) use of aspirin: Secondary | ICD-10-CM | POA: Insufficient documentation

## 2014-09-07 DIAGNOSIS — H538 Other visual disturbances: Secondary | ICD-10-CM | POA: Insufficient documentation

## 2014-09-07 DIAGNOSIS — Z7951 Long term (current) use of inhaled steroids: Secondary | ICD-10-CM | POA: Insufficient documentation

## 2014-09-07 DIAGNOSIS — Z880 Allergy status to penicillin: Secondary | ICD-10-CM | POA: Insufficient documentation

## 2014-09-07 DIAGNOSIS — R0789 Other chest pain: Secondary | ICD-10-CM | POA: Insufficient documentation

## 2014-09-07 DIAGNOSIS — E119 Type 2 diabetes mellitus without complications: Secondary | ICD-10-CM | POA: Insufficient documentation

## 2014-09-07 LAB — CBC WITH DIFFERENTIAL/PLATELET
BASOS ABS: 0 10*3/uL (ref 0.0–0.1)
Basophils Relative: 0 % (ref 0–1)
Eosinophils Absolute: 0.2 10*3/uL (ref 0.0–0.7)
Eosinophils Relative: 3 % (ref 0–5)
HCT: 41.7 % (ref 39.0–52.0)
Hemoglobin: 14.3 g/dL (ref 13.0–17.0)
Lymphocytes Relative: 41 % (ref 12–46)
Lymphs Abs: 2.4 10*3/uL (ref 0.7–4.0)
MCH: 31.1 pg (ref 26.0–34.0)
MCHC: 34.3 g/dL (ref 30.0–36.0)
MCV: 90.7 fL (ref 78.0–100.0)
MONOS PCT: 8 % (ref 3–12)
Monocytes Absolute: 0.5 10*3/uL (ref 0.1–1.0)
Neutro Abs: 2.9 10*3/uL (ref 1.7–7.7)
Neutrophils Relative %: 48 % (ref 43–77)
PLATELETS: 184 10*3/uL (ref 150–400)
RBC: 4.6 MIL/uL (ref 4.22–5.81)
RDW: 12.5 % (ref 11.5–15.5)
WBC: 5.8 10*3/uL (ref 4.0–10.5)

## 2014-09-07 MED ORDER — NITROGLYCERIN 2 % TD OINT
1.0000 [in_us] | TOPICAL_OINTMENT | Freq: Once | TRANSDERMAL | Status: AC
  Administered 2014-09-08: 1 [in_us] via TOPICAL
  Filled 2014-09-07: qty 1

## 2014-09-07 MED ORDER — ASPIRIN 81 MG PO CHEW
324.0000 mg | CHEWABLE_TABLET | Freq: Once | ORAL | Status: AC
  Administered 2014-09-08: 324 mg via ORAL
  Filled 2014-09-07: qty 4

## 2014-09-07 NOTE — ED Provider Notes (Signed)
CSN: [id number]     Arrival date & time 09/07/14  2213 History  This chart was scribed for Janice Norrie, MD by Tula Nakayama, ED Scribe. This patient was seen in room APA05/APA05 and the patient's care was started at 11:30 PM.    Chief Complaint  Patient presents with  . Palpitations   The history is provided by the patient. No language interpreter was used.    HPI Comments: Richard [name] is a 49 y.o. male who presents to the Emergency Department complaining of intermittent, gradually improving heart palpitations that started 2 hours ago and are mostly resolved. He describes sensation as a jumping and irregular skipping, without racing. Pt notes pulsating vision, light-headedness, SOB, pressure in his head and neck, and mild chest pressure during the episode. He also had diaphoresis on his scalp and forehead.  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 Pt notes a history of anxiety attacks with a sharp chest pain in the center of his chest. This current pain is different than prior symptoms because it is occurring  in the left chest and is not accompanied by sharp pains. Pt also states baseline, chronic fluttering in his esophagus. He has follow-up on tomorrow with gastroenterologist at [place] with Dr Oneida Alar. Pt has family history of CAD <55 yo in multiple family members in both maternal and paternal side. He sees cardiologist at [facility] 1-2 times each year for HTN, COPD, CAD. His last catheterization in 2005 showed early blockages. He takes a baby Aspirin daily. Pt has reduced smoking habit to 5 cigarettes daily. Pt is unemployed, but not disabled. He denies EtOH use. Pt denies nausea.   Pt also notes short-term memory loss that started 1 month ago. He states he often forgets what he has done right after he finished it.  PCP [facility] Dr Jacinta Shoe [facility] [name]  Past Medical History  Diagnosis Date  . Chest pain     + palpitations;  cath 2005- 30-40% mid LAD, 20% D1, 20% cx, OM, 20-30% RCA, and EF-55%  . COPD (chronic obstructive pulmonary disease)   . GERD (gastroesophageal reflux disease)   . Hyperlipemia   . Hypertension   . Depression   . Colitis 1990  . Tobacco abuse     1/2 pack per day  . Diabetes mellitus   . Gastric ulcer 2003; 2012    2003: + esophagitis; negative H.pylori serology  2012: Dr. Oneida Alar, mild gastritis, Bravo PH probe placement, negative H.pylori  . Hepatic steatosis   . Chronic low back pain   . Panic attacks   . Asthma    Past Surgical History  Procedure Laterality Date  . Colonoscopy  1990  . Shoulder surgery      Right acromioclavicular joint arthrosis  . Neck mass excision    . Bravo ph study  05/03/2011    NAT:FTDD gastritis/normal esophagus and duodenum  . Cardiac catheterization  2005  . Esophagogastroduodenoscopy  05/03/2011    UKG:URKY gastritis   Family History  Problem Relation Age of Onset  . Lung cancer Mother   . Alcohol abuse Mother   . Heart attack Father 85  . Diabetes Father   . Alcohol abuse Father   . Hypertension Brother   . Colon cancer Neg Hx   . Drug abuse Neg Hx   . Bipolar disorder Neg Hx   . OCD Neg Hx   . Paranoid behavior Neg Hx   .  Schizophrenia Neg Hx   . Sexual abuse Neg Hx   . Physical abuse Neg Hx   . Hypertension Brother   . Anxiety disorder Sister   . Depression Sister   . Dementia Paternal Uncle   . Dementia Cousin   . Anxiety disorder Sister   . Heart attack Brother 79  . Diabetes Brother   . Hypertension Brother   . Seizures Brother   . ADD / ADHD Daughter    History  Substance Use Topics  . Smoking status: Current Every Day Smoker -- 0.50 packs/day for 29 years    Types: Cigarettes  . Smokeless tobacco: Never Used     Comment: on the verge of quitting as of 01-03-13  . Alcohol Use: No  unemployed Has applied for disability and turned down x 4, the last time last month Smokes 5 cigs a day Has stopped drinking [employer]  Review of Systems  Eyes: Positive for visual disturbance.  Respiratory: Positive for shortness of breath.   Cardiovascular: Positive for chest pain and palpitations.  Gastrointestinal: Negative for nausea.  Neurological: Positive for light-headedness and headaches.  All other systems reviewed and are negative.  Allergies  Mushroom ext cmplx(shiitake-reishi-mait) and Penicillins  Home Medications   Prior to Admission medications   Medication Sig Start Date End Date Taking? Authorizing Provider  acetaminophen (TYLENOL) 325 MG tablet Take 650 mg by mouth every 6 (six) hours as needed for pain or fever.     Historical Provider, MD  albuterol (VENTOLIN HFA) 108 (90 BASE) MCG/ACT inhaler Inhale 2 puffs into the lungs every 6 (six) hours as needed for wheezing or shortness of breath.     Historical Provider, MD  ALPRAZolam Duanne Moron) 0.5 MG tablet Take 1 tablet (0.5 mg total) by mouth 4 (four) times daily. 09/02/14   [name], MD  aspirin 81 MG tablet Take 81 mg by mouth at bedtime.     Historical Provider, MD  cyclobenzaprine (FLEXERIL) 10 MG tablet Take 1 tablet (10 mg total) by mouth 3 (three) times daily as needed for muscle spasms. 05/29/13   Thurnell Lose, MD  esomeprazole (NEXIUM) 40 MG capsule Take 1 capsule (40 mg total) by mouth 2 (two) times daily before a meal. 11/13/13   Orvil Feil, NP  Fluticasone-Salmeterol (ADVAIR) 250-50 MCG/DOSE AEPB Inhale 1 puff into the lungs every 12 (twelve) hours.    Historical Provider, MD  HYDROcodone-acetaminophen (NORCO/VICODIN) 5-325 MG per tablet Take 1 tablet by mouth every 6 (six) hours as needed for pain. 04/09/13   Theodis Blaze, MD  ibuprofen (ADVIL,MOTRIN) 600 MG tablet Take 1 tablet (600 mg total) by mouth every 8 (eight) hours as needed for moderate pain. 05/29/13   Thurnell Lose, MD  lisinopril (PRINIVIL,ZESTRIL) 10 MG tablet Take 1 tablet (10 mg total) by mouth daily. 02/25/13   Herminio Commons, MD  lovastatin (MEVACOR) 20 MG  tablet Take 1 tablet (20 mg total) by mouth daily at 6 PM. 03/16/14   Herminio Commons, MD  metFORMIN (GLUCOPHAGE) 500 MG tablet Take 1 tablet (500 mg total) by mouth 2 (two) times daily with a meal. 04/28/13   Tresa Garter, MD  metoprolol succinate (TOPROL-XL) 50 MG 24 hr tablet TAKE ONE & ONE-HALF TABLETS BY MOUTH ONCE DAILY 07/03/14   Herminio Commons, MD  nitroGLYCERIN (NITROSTAT) 0.4 MG SL tablet Place 1 tablet (0.4 mg total) under the tongue every 5 (five) minutes as needed for chest pain. 06/22/14  Lendon Colonel, NP  sertraline (ZOLOFT) 50 MG tablet Take 1 tablet (50 mg total) by mouth daily. 09/02/14 09/02/15  [name], MD   BP 166/77 mmHg  Pulse 72  Temp(Src) 98.2 F (36.8 C) (Oral)  Resp 24  Ht 5' 11.5" (1.816 m)  Wt 210 lb (95.255 kg)  BMI 28.88 kg/m2  SpO2 100%  Vital signs normal except for hypertension   Physical Exam  Constitutional: He is oriented to person, place, and time. He appears well-developed and well-nourished.  Non-toxic appearance. He does not appear ill. No distress.  HENT:  Head: Normocephalic and atraumatic.  Right Ear: External ear normal.  Left Ear: External ear normal.  Nose: Nose normal. No mucosal edema or rhinorrhea.  Mouth/Throat: Oropharynx is clear and moist and mucous membranes are normal. No dental abscesses or uvula swelling.  Eyes: Conjunctivae and EOM are normal. Pupils are equal, round, and reactive to light.  Neck: Normal range of motion and full passive range of motion without pain. Neck supple.  Cardiovascular: Normal rate, regular rhythm and normal heart sounds.  Exam reveals no gallop and no friction rub.   No murmur heard. Pulmonary/Chest: Effort normal and breath sounds normal. No respiratory distress. He has no wheezes. He has no rhonchi. He has no rales. He exhibits tenderness. He exhibits no crepitus.    Mild left chest wall tenderness Area of tenderness shown  Abdominal: Soft. Normal appearance and bowel  sounds are normal. He exhibits no distension. There is no tenderness. There is no rebound and no guarding.  Musculoskeletal: Normal range of motion. He exhibits no edema or tenderness.  Moves all extremities well.   Neurological: He is alert and oriented to person, place, and time. He has normal strength. No cranial nerve deficit.  Skin: Skin is warm, dry and intact. No rash noted. No erythema. No pallor.  Psychiatric: He has a normal mood and affect. His speech is normal and behavior is normal. His mood appears not anxious.  Nursing note and vitals reviewed.   ED Course  Procedures    Medications  aspirin chewable tablet 324 mg (324 mg Oral Given 09/08/14 0009)  nitroGLYCERIN (NITROGLYN) 2 % ointment 1 inch (1 inch Topical Given 09/08/14 0010)  potassium chloride SA (K-DUR,KLOR-CON) CR tablet 40 mEq (40 mEq Oral Given 09/08/14 0050)     DIAGNOSTIC STUDIES: Oxygen Saturation is 100% on RA, normal by my interpretation.    COORDINATION OF CARE: 11:42 PM Discussed treatment plan with pt at bedside and he agreed to plan.  12:53 AM Pain is gone, but pt repotts some lingering soreness  02:30 waiting for second troponin, states still painfree, has mild headache from NTG, but refuses meds.    Labs Review Results for orders placed or performed during the hospital encounter of 09/07/14  CBC with Differential/Platelet  Result Value Ref Range   WBC 5.8 4.0 - 10.5 K/uL   RBC 4.60 4.22 - 5.81 MIL/uL   Hemoglobin 14.3 13.0 - 17.0 g/dL   HCT 41.7 39.0 - 52.0 %   MCV 90.7 78.0 - 100.0 fL   MCH 31.1 26.0 - 34.0 pg   MCHC 34.3 30.0 - 36.0 g/dL   RDW 12.5 11.5 - 15.5 %   Platelets 184 150 - 400 K/uL   Neutrophils Relative % 48 43 - 77 %   Neutro Abs 2.9 1.7 - 7.7 K/uL   Lymphocytes Relative 41 12 - 46 %   Lymphs Abs 2.4 0.7 - 4.0 K/uL  Monocytes Relative 8 3 - 12 %   Monocytes Absolute 0.5 0.1 - 1.0 K/uL   Eosinophils Relative 3 0 - 5 %   Eosinophils Absolute 0.2 0.0 - 0.7 K/uL    Basophils Relative 0 0 - 1 %   Basophils Absolute 0.0 0.0 - 0.1 K/uL  Basic metabolic panel  Result Value Ref Range   Sodium 136 135 - 145 mmol/L   Potassium 3.3 (L) 3.5 - 5.1 mmol/L   Chloride 110 96 - 112 mmol/L   CO2 26 19 - 32 mmol/L   Glucose, Bld 179 (H) 70 - 99 mg/dL   BUN 16 6 - 23 mg/dL   Creatinine, Ser 0.98 0.50 - 1.35 mg/dL   Calcium 8.2 (L) 8.4 - 10.5 mg/dL   GFR calc non Af Amer >90 >90 mL/min   GFR calc Af Amer >90 >90 mL/min   Anion gap 0 (L) 5 - 15  Troponin I  Result Value Ref Range   Troponin I <0.03 <0.031 ng/mL  Troponin I  Result Value Ref Range   Troponin I <0.03 <0.031 ng/mL    Laboratory interpretation all normal except hypokalemia    Imaging Review Dg Chest Portable 1 View  09/07/2014   CLINICAL DATA:  Palpitations in pain in the left side of the chest for several hr tonight. Smoker.  EXAM: PORTABLE CHEST - 1 VIEW  COMPARISON:  02/05/2013  FINDINGS: The heart size and mediastinal contours are within normal limits. Both lungs are clear. The visualized skeletal structures are unremarkable.  IMPRESSION: No active disease.   Electronically Signed   By: Lucienne Capers M.D.   On: 09/07/2014 23:39     EKG Interpretation   Date/Time:  Monday September 07 2014 22:17:43 EST Ventricular Rate:  72 PR Interval:  168 QRS Duration: 102 QT Interval:  386 QTC Calculation: 422 R Axis:   77 Text Interpretation:  Normal sinus rhythm Nonspecific T wave abnormality  Abnormal ECG Nonspecific T wave abnormality Confirmed by Wyvonnia Dusky  MD,  STEPHEN [phone]) on 09/07/2014 10:30:57 PM      MDM   Final diagnoses:  Hypokalemia  Atypical chest pain    New Prescriptions   POTASSIUM CHLORIDE SA (K-DUR,KLOR-CON) 20 MEQ TABLET    Take 1 tablet (20 mEq total) by mouth 2 (two) times daily.    Plan discharge   I personally performed the services described in this documentation, which was scribed in my presence. The recorded information has been reviewed and  considered.  Rolland Porter, MD, FACEP    Janice Norrie, MD 09/08/14 [phone]

## 2014-09-07 NOTE — ED Notes (Signed)
Pt states been having palpitations w/ pain to the right shoulder.

## 2014-09-07 NOTE — ED Notes (Signed)
Pt c/o heart fluttering x 1 hour

## 2014-09-08 LAB — TROPONIN I: Troponin I: <0.03 ng/mL (ref <0.031)

## 2014-09-08 LAB — BASIC METABOLIC PANEL
Anion gap: 0 — ABNORMAL LOW (ref 5–15)
BUN: 16 mg/dL (ref 6–23)
CALCIUM: 8.2 mg/dL — AB (ref 8.4–10.5)
CO2: 26 mmol/L (ref 19–32)
CREATININE: 0.98 mg/dL (ref 0.50–1.35)
Chloride: 110 mmol/L (ref 96–112)
GFR calc Af Amer: >90 mL/min (ref >90)
GLUCOSE: 179 mg/dL — AB (ref 70–99)
Potassium: 3.3 mmol/L — ABNORMAL LOW (ref 3.5–5.1)
Sodium: 136 mmol/L (ref 135–145)

## 2014-09-08 MED ORDER — POTASSIUM CHLORIDE CRYS ER 20 MEQ PO TBCR
40.0000 meq | EXTENDED_RELEASE_TABLET | Freq: Once | ORAL | Status: AC
  Administered 2014-09-08: 40 meq via ORAL
  Filled 2014-09-08: qty 2

## 2014-09-08 MED ORDER — POTASSIUM CHLORIDE CRYS ER 20 MEQ PO TBCR: 20.0000 meq | EXTENDED_RELEASE_TABLET | Freq: Two times a day (BID) | ORAL | Status: DC

## 2014-09-08 NOTE — ED Notes (Signed)
Pt alert & oriented x4, stable gait. Patient given discharge instructions, paperwork & prescription(s). Patient  instructed to stop at the registration desk to finish any additional paperwork. Patient verbalized understanding. Pt left department w/ no further questions. 

## 2014-09-08 NOTE — Discharge Instructions (Signed)
Take the potassium pills until gone. Call Dr Kristian Covey office to get an appointment to have him evaluate you soon. Return to the ED if you feel worse again.

## 2014-09-16 ENCOUNTER — Ambulatory Visit: Payer: Self-pay | Admitting: Nurse Practitioner

## 2014-10-06 ENCOUNTER — Encounter: Payer: Self-pay | Admitting: Gastroenterology

## 2014-10-16 ENCOUNTER — Encounter: Payer: Self-pay | Admitting: Cardiovascular Disease

## 2014-10-16 ENCOUNTER — Ambulatory Visit (INDEPENDENT_AMBULATORY_CARE_PROVIDER_SITE_OTHER): Payer: Self-pay | Admitting: Cardiovascular Disease

## 2014-10-16 VITALS — BP 112/72 | HR 67 | Ht 71.0 in | Wt 216.8 lb

## 2014-10-16 DIAGNOSIS — E782 Mixed hyperlipidemia: Secondary | ICD-10-CM

## 2014-10-16 DIAGNOSIS — Z720 Tobacco use: Secondary | ICD-10-CM

## 2014-10-16 DIAGNOSIS — I25118 Atherosclerotic heart disease of native coronary artery with other forms of angina pectoris: Secondary | ICD-10-CM

## 2014-10-16 DIAGNOSIS — E876 Hypokalemia: Secondary | ICD-10-CM

## 2014-10-16 DIAGNOSIS — R079 Chest pain, unspecified: Secondary | ICD-10-CM

## 2014-10-16 DIAGNOSIS — F419 Anxiety disorder, unspecified: Secondary | ICD-10-CM

## 2014-10-16 DIAGNOSIS — F418 Other specified anxiety disorders: Secondary | ICD-10-CM

## 2014-10-16 DIAGNOSIS — F17200 Nicotine dependence, unspecified, uncomplicated: Secondary | ICD-10-CM

## 2014-10-16 DIAGNOSIS — R002 Palpitations: Secondary | ICD-10-CM

## 2014-10-16 DIAGNOSIS — F329 Major depressive disorder, single episode, unspecified: Secondary | ICD-10-CM

## 2014-10-16 DIAGNOSIS — I10 Essential (primary) hypertension: Secondary | ICD-10-CM

## 2014-10-16 NOTE — Progress Notes (Signed)
Patient ID: Richard [name], male   DOB: [birth year], 49 y.o.   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 He underwent a low risk Lexiscan Cardiolite stress test in September 2014, with no evidence of myocardial ischemia or scar and normal left ventricular systolic function. He follows with a psychiatrist.  He was evaluated for palpitations and chest pain in the emergency room on 09/07/14. I personally reviewed all labs, studies, and documentation related to this visit. Basic metabolic panel revealed mild hypokalemia, potassium 3.3. CBC was normal. Troponins were also normal. Chest x-ray showed no active disease. ECG showed normal sinus rhythm with a nonspecific T wave abnormality. He was given supplemental potassium.  He currently complains of a constant retrosternal chest burning associated with rapid "vibrations in the chest". He takes his pulse during those times and said that it is normal, as he was instructed to do so by a cardiologist years ago. He said it is worse when lying down on his left side. He also complains of panic attacks several times a day and takes Xanax for this. He was given 4 days of supplemental potassium and ran out. When he was taking potassium, he felt somewhat better.   Review of Systems: As per "subjective", otherwise negative.  Allergies  Allergen Reactions  . Mushroom Ext Cmplx(Shiitake-Reishi-Mait) Anaphylaxis    Rapid heart rate.  . Penicillins Anaphylaxis    Current Outpatient Prescriptions  Medication Sig Dispense Refill  . acetaminophen (TYLENOL) 325 MG tablet Take 650 mg by mouth every 6 (six) hours as needed for pain or fever.     [name] albuterol (VENTOLIN HFA) 108 (90 BASE)  MCG/ACT inhaler Inhale 2 puffs into the lungs every 6 (six) hours as needed for wheezing or shortness of breath.     . ALPRAZolam (XANAX) 0.5 MG tablet Take 1 tablet (0.5 mg total) by mouth 4 (four) times daily. 120 tablet 2  . aspirin 81 MG tablet Take 81 mg by mouth at bedtime.     . cyclobenzaprine (FLEXERIL) 10 MG tablet Take 1 tablet (10 mg total) by mouth 3 (three) times daily as needed for muscle spasms. 30 tablet 2  . esomeprazole (NEXIUM) 40 MG capsule Take 1 capsule (40 mg total) by mouth 2 (two) times daily before a meal. 180 capsule 3  . Fluticasone-Salmeterol (ADVAIR) 250-50 MCG/DOSE AEPB Inhale 1 puff into the lungs every 12 (twelve) hours.    [name] HYDROcodone-acetaminophen (NORCO/VICODIN) 5-325 MG per tablet Take 1 tablet by mouth every 6 (six) hours as needed for pain. 90 tablet 1  . ibuprofen (ADVIL,MOTRIN) 600 MG tablet Take 1 tablet (600 mg total) by mouth every 8 (eight) hours as needed for moderate pain. 30 tablet 1  . lisinopril (PRINIVIL,ZESTRIL) 10 MG tablet Take 1 tablet (10 mg total) by mouth daily. 90 tablet 1  . lovastatin (MEVACOR) 20 MG tablet Take 1 tablet (20 mg total) by mouth daily at 6 PM. 30 tablet 9  . metFORMIN (GLUCOPHAGE) 500 MG tablet Take 1 tablet (500 mg total) by mouth 2 (two) times daily with a meal. 30 tablet 2  . metoprolol succinate (TOPROL-XL) 50 MG 24 hr tablet TAKE ONE & ONE-HALF TABLETS BY MOUTH ONCE DAILY 45 tablet 11  . nitroGLYCERIN (NITROSTAT)  0.4 MG SL tablet Place 1 tablet (0.4 mg total) under the tongue every 5 (five) minutes as needed for chest pain. 25 tablet 3  . potassium chloride SA (K-DUR,KLOR-CON) 20 MEQ tablet Take 1 tablet (20 mEq total) by mouth 2 (two) times daily. 8 tablet 0  . sertraline (ZOLOFT) 50 MG tablet Take 50 mg by mouth daily.     No current facility-administered medications for this visit.    Past Medical History  Diagnosis Date  . Chest pain     + palpitations; cath 2005- 30-40% mid LAD, 20% D1, 20% cx, OM, 20-30%  RCA, and EF-55%  . COPD (chronic obstructive pulmonary disease)   . GERD (gastroesophageal reflux disease)   . Hyperlipemia   . Hypertension   . Depression   . Colitis 1990  . Tobacco abuse     1/2 pack per day  . Diabetes mellitus   . Gastric ulcer 2003; 2012    2003: + esophagitis; negative H.pylori serology  2012: Dr. Oneida Alar, mild gastritis, Bravo PH probe placement, negative H.pylori  . Hepatic steatosis   . Chronic low back pain   . Panic attacks   . Asthma     Past Surgical History  Procedure Laterality Date  . Colonoscopy  1990  . Shoulder surgery      Right acromioclavicular joint arthrosis  . Neck mass excision    . Bravo ph study  05/03/2011    VVO:HYWV gastritis/normal esophagus and duodenum  . Cardiac catheterization  2005  . Esophagogastroduodenoscopy  05/03/2011    PXT:GGYI gastritis    History   Social History  . Marital Status: Married    Spouse Name: N/A  . Number of Children: N/A  . Years of Education: N/A   Occupational History  . full time    Social History Main Topics  . Smoking status: Current Every Day Smoker -- 0.50 packs/day for 29 years    Types: Cigarettes    Start date: 07/17/1982  . Smokeless tobacco: Never Used     Comment: on the verge of quitting as of 01-03-13  . Alcohol Use: No  . Drug Use: No  . Sexual Activity: Yes   Other Topics Concern  . Not on file   Social History Narrative     Filed Vitals:   10/16/14 1044  BP: 112/72  Pulse: 67  Height: 5\' 11"  (1.803 m)  Weight: 216 lb 12.8 oz (98.34 kg)  SpO2: 97%    PHYSICAL EXAM General: NAD HEENT: Normal. Neck: No JVD, no thyromegaly. Lungs: Clear to auscultation bilaterally with normal respiratory effort. CV: Nondisplaced PMI.  Regular rate and rhythm, normal S1/S2, no S3/S4, no murmur. No pretibial or periankle edema.   Abdomen: Soft, nontender, no distention.  Neurologic: Alert and oriented x 3.  Psych: Normal affect. Skin: Normal. Musculoskeletal: No gross  deformities. Extremities: No clubbing or cyanosis.   ECG: Most recent ECG reviewed.      ASSESSMENT AND PLAN: 1. CAD: Normal Lexiscan Cardiolite stress test in 03/2013. All labs and studies from 09/07/14 reviewed. ECG reviewed and noted above. No changes to therapy which includes ASA, metoprolol, and statin. 2. Essential HTN: Controlled on current therapy. No changes. 3. Palpitations: Controlled on current dose of metoprolol. I auscultated in different positions and a regular rhythm was maintained. No indication for cardiac Holter or event monitoring. 4. Hyperlipidemia: On lovastatin. Will obtain lipids. 5. Hypokalemia: Will check a BMET to see if he requires additional KCl supplementation.  Dispo: f/u  1 year.  Time spent: 40 minutes, of which greater than 50% was spent reviewing symptoms, relevant blood tests and studies, and discussing management plan with the patient.   Kate Sable, M.D., F.A.C.C.

## 2014-10-16 NOTE — Patient Instructions (Signed)
Your physician wants you to follow-up in: 1 Year with Richard Sims NP You will receive a reminder letter in the mail two months in advance. If you don't receive a letter, please call our office to schedule the follow-up appointment.    Your physician recommends that you continue on your current medications as directed. Please refer to the Current Medication list given to you today.    Please get lab work Artist)     Thank you for choosing [facility] !

## 2014-10-20 LAB — BASIC METABOLIC PANEL
BUN: 15 mg/dL (ref 6–23)
CO2: 26 mEq/L (ref 19–32)
Calcium: 8.9 mg/dL (ref 8.4–10.5)
Chloride: 104 mEq/L (ref 96–112)
Creat: 0.93 mg/dL (ref 0.50–1.35)
Glucose, Bld: 159 mg/dL — ABNORMAL HIGH (ref 70–99)
POTASSIUM: 4.4 meq/L (ref 3.5–5.3)
Sodium: 139 mEq/L (ref 135–145)

## 2014-11-04 ENCOUNTER — Other Ambulatory Visit: Payer: Self-pay | Admitting: Internal Medicine

## 2014-12-01 ENCOUNTER — Ambulatory Visit (INDEPENDENT_AMBULATORY_CARE_PROVIDER_SITE_OTHER): Payer: Self-pay | Admitting: Psychiatry

## 2014-12-01 ENCOUNTER — Encounter (HOSPITAL_COMMUNITY): Payer: Self-pay | Admitting: Psychiatry

## 2014-12-01 VITALS — BP 118/67 | HR 61 | Ht 71.0 in | Wt 213.2 lb

## 2014-12-01 DIAGNOSIS — F411 Generalized anxiety disorder: Secondary | ICD-10-CM

## 2014-12-01 DIAGNOSIS — F4310 Post-traumatic stress disorder, unspecified: Secondary | ICD-10-CM

## 2014-12-01 DIAGNOSIS — F4001 Agoraphobia with panic disorder: Secondary | ICD-10-CM

## 2014-12-01 DIAGNOSIS — F329 Major depressive disorder, single episode, unspecified: Secondary | ICD-10-CM

## 2014-12-01 DIAGNOSIS — F32A Depression, unspecified: Secondary | ICD-10-CM

## 2014-12-01 DIAGNOSIS — G8929 Other chronic pain: Secondary | ICD-10-CM

## 2014-12-01 DIAGNOSIS — F332 Major depressive disorder, recurrent severe without psychotic features: Secondary | ICD-10-CM

## 2014-12-01 DIAGNOSIS — F419 Anxiety disorder, unspecified: Secondary | ICD-10-CM

## 2014-12-01 DIAGNOSIS — F17200 Nicotine dependence, unspecified, uncomplicated: Secondary | ICD-10-CM

## 2014-12-01 DIAGNOSIS — M545 Low back pain: Secondary | ICD-10-CM

## 2014-12-01 MED ORDER — ALPRAZOLAM 0.5 MG PO TABS: 0.5000 mg | ORAL_TABLET | Freq: Four times a day (QID) | ORAL | Status: DC

## 2014-12-01 NOTE — Progress Notes (Signed)
Patient ID: TRIGO WINTERBOTTOM, male   DOB: 10/04/1965, 49 y.o.   MRN: [id number] Patient ID: KAYMAN SNUFFER, male   DOB: 1965-08-19, 49 y.o.   MRN: [id number] Patient ID: LANDYN BUCKALEW, male   DOB: [birth year], 49 y.o.   MRN: [id number] Patient ID: BARNET BENAVIDES, male   DOB: 1966-02-18, 49 y.o.   MRN: [id number] Patient ID: SEYDOU HEARNS, male   DOB: 04/21/1966, 49 y.o.   MRN: [id number] Patient ID: MEHKI KLUMPP, male   DOB: [birth year], 49 y.o.   MRN: [id number] Patient ID: MAJD TISSUE, male   DOB: 19-Jun-1966, 49 y.o.   MRN: [id number] Patient ID: JAYLUN FLEENER, male   DOB: 1966/02/11, 49 y.o.   MRN: [id number] Patient ID: GIANLUCCA SZYMBORSKI, male   DOB: 1966-03-14, 49 y.o.   MRN: [id number] Patient ID: OTT ZIMMERLE, male   DOB: 10/01/1965, 49 y.o.   MRN: [id number] Patient ID: ELVAN EBRON, male   DOB: Jun 28, 1966, 49 y.o.   MRN: [id number] [facility] [id number] Progress Note AMY BELLOSO MRN: [id number] DOB: 21-Jun-1966 Age: 49 y.o.  Date: 12/01/2014  Chief Complaint  Patient presents with  . Anxiety   History of Chief Complaint:   This 49 year old Caucasian male who came for his followup appointment. He is currently living with his wife in [place]. He is unemployed and applying for disability.  The patient states that he has been employed for 5 years. 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 He got injured on the job and both shoulders were torn. He has not been able to work ever since and he feels very badly about this. 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  Since he's not been able to work the patient has been having increasingly depressed and anxious. He feels like his body gives out when he is trying to exert himself even in a minor way. He's been in the ER recently for chest pain which was ruled out as a panic attack. He also saw cardiologist this week and will be having a stress test. His blood pressure was too low  and his lisinopril was cut down. He is also trying to quit smoking by using electronic cigarettes.  The patient returns after 3 months. He tells me he is doing okay but barely getting by financially. He's going to try reapplying for disability and again. He's trying to "fight" his anxiety disorder by getting out with his father-in-law. As long as he stays busy does pretty well. He's not using the Zoloft but does take the Xanax usually up to 3 a day. He sleeping fairly well. Anxiety Presents for follow-up visit. Symptoms include nervous/anxious behavior.     Review of Systems  Constitutional: Positive for fatigue.  Gastrointestinal: Negative.   Musculoskeletal: Positive for back pain.  Skin: Negative.   Psychiatric/Behavioral: Positive for sleep disturbance and dysphoric mood. The patient is nervous/anxious.     Physical Exam Vitals: BP 118/67 mmHg  Pulse 61  Ht 5\' 11"  (1.803 m)  Wt 96.707 kg (213 lb 3.2 oz)  BMI 29.75 kg/m2  SpO2 94%   Past Psychiatric History: Diagnosis: none  Hospitalizations: none  Outpatient Care: none  Substance Abuse Care: none  Self-Mutilation: none  Suicidal Attempts: none  Violent Behaviors: none   Allergies: Allergies  Allergen Reactions  . Mushroom Ext Cmplx(Shiitake-Reishi-Mait) Anaphylaxis    Rapid heart rate.  [name]  Penicillins Anaphylaxis   Medical History: Past Medical History  Diagnosis Date  . Chest pain     + palpitations; cath 2005- 30-40% mid LAD, 20% D1, 20% cx, OM, 20-30% RCA, and EF-55%  . COPD (chronic obstructive pulmonary disease)   . GERD (gastroesophageal reflux disease)   . Hyperlipemia   . Hypertension   . Depression   . Colitis 1990  . Tobacco abuse     1/2 pack per day  . Diabetes mellitus   . Gastric ulcer 2003; 2012    2003: + esophagitis; negative H.pylori serology  2012: Dr. Oneida Alar, mild gastritis, Bravo PH probe placement, negative H.pylori  . Hepatic steatosis   . Chronic low back pain   . Panic attacks    . Asthma    Surgical History: Past Surgical History  Procedure Laterality Date  . Colonoscopy  1990  . Shoulder surgery      Right acromioclavicular joint arthrosis  . Neck mass excision    . Bravo ph study  05/03/2011    QAS:TMHD gastritis/normal esophagus and duodenum  . Cardiac catheterization  2005  . Esophagogastroduodenoscopy  05/03/2011    QQI:WLNL gastritis   Family History: family history includes ADD / ADHD in his daughter; Alcohol abuse in his father and mother; Anxiety disorder in his sister and sister; Dementia in his cousin and paternal uncle; Depression in his sister; Diabetes in his brother and father; Heart attack (age of onset: 64) in his brother; Heart attack (age of onset: 51) in his father; Hypertension in his brother, brother, and brother; Lung cancer in his mother; Seizures in his brother. There is no history of Colon cancer, Drug abuse, Bipolar disorder, OCD, Paranoid behavior, Schizophrenia, Sexual abuse, or Physical abuse. Reviewed again today and nothing new.   Current Medications:  Current Outpatient Prescriptions  Medication Sig Dispense Refill  . acetaminophen (TYLENOL) 325 MG tablet Take 650 mg by mouth every 6 (six) hours as needed for pain or fever.     [name] albuterol (VENTOLIN HFA) 108 (90 BASE) MCG/ACT inhaler Inhale 2 puffs into the lungs every 6 (six) hours as needed for wheezing or shortness of breath.     . ALPRAZolam (XANAX) 0.5 MG tablet Take 1 tablet (0.5 mg total) by mouth 4 (four) times daily. 120 tablet 2  . aspirin 81 MG tablet Take 81 mg by mouth at bedtime.     . cyclobenzaprine (FLEXERIL) 10 MG tablet Take 1 tablet (10 mg total) by mouth 3 (three) times daily as needed for muscle spasms. 30 tablet 2  . esomeprazole (NEXIUM) 40 MG capsule Take 1 capsule (40 mg total) by mouth 2 (two) times daily before a meal. 180 capsule 3  . Fluticasone-Salmeterol (ADVAIR) 250-50 MCG/DOSE AEPB Inhale 1 puff into the lungs every 12 (twelve) hours.    [name]  HYDROcodone-acetaminophen (NORCO/VICODIN) 5-325 MG per tablet Take 1 tablet by mouth every 6 (six) hours as needed for pain. 90 tablet 1  . ibuprofen (ADVIL,MOTRIN) 600 MG tablet Take 1 tablet (600 mg total) by mouth every 8 (eight) hours as needed for moderate pain. 30 tablet 1  . lisinopril (PRINIVIL,ZESTRIL) 10 MG tablet Take 1 tablet (10 mg total) by mouth daily. 90 tablet 1  . lovastatin (MEVACOR) 20 MG tablet Take 1 tablet (20 mg total) by mouth daily at 6 PM. 30 tablet 9  . metFORMIN (GLUCOPHAGE) 500 MG tablet Take 1 tablet (500 mg total) by mouth 2 (two) times daily with a meal. 30 tablet 2  .  metoprolol succinate (TOPROL-XL) 50 MG 24 hr tablet TAKE ONE & ONE-HALF TABLETS BY MOUTH ONCE DAILY 45 tablet 11  . nitroGLYCERIN (NITROSTAT) 0.4 MG SL tablet Place 1 tablet (0.4 mg total) under the tongue every 5 (five) minutes as needed for chest pain. 25 tablet 3   No current facility-administered medications for this visit.    Previous Psychotropic Medications: Medication Dose   Xanax     Substance Abuse History in the last 12 months: Substance Age of 1st Use Last Use Amount Specific Type  Nicotine  17  2 hours ago  1  cigarette  Alcohol  22  38      Cannabis  none        Opiates  36  today  2.5 mg  hyrdocodone  Cocaine  none        Methamphetamines  none        LSD  none        Ecstasy  none         Benzodiazepines  22  started in early thrities  0.5 mg  Xanax  Caffeine  childhood  this AM  1 cup  Mt Dew  Inhalants  none        Others:      Sugar  11  this AM  20 tsps  in the [place] Consequences of Substance Abuse: pain Legal Consequences of Substance Abuse: none Family Consequences of Substance Abuse: none Blackouts:  No DT's:  No Withdrawal Symptoms:  Yes Tremors  Social History: Patient lives with his life, daughter and her husband with 2 grandbabies.  He has 2 sons and the daughter.  He is a Programmer, systems.    Mental Status  Examination/Evaluation: Objective:  Appearance: Casual  Eye Contact::  Good  Speech:  Clear and Coherent  Volume:  Normal  Mood:  Fairly good   Affect:  Congruent  Thought Process:  Coherent, Intact and Linear  Orientation:  Full (Time, Place, and Person)  Thought Content:  WDL  Suicidal Thoughts:  No  Homicidal Thoughts:  No  Judgement:  Good  Insight:  Fair  Psychomotor Activity:  Normal  Akathisia:  No  Handed:  Right  AIMS (if indicated):    Assets:  Communication Skills Desire for Improvement   Lab Results:  Results for orders placed or performed in visit on 10/16/14 (from the past 8736 hour(s))  Basic Metabolic Panel (BMET)   Collection Time: 10/19/14  9:07 AM  Result Value Ref Range   Sodium 139 135 - 145 mEq/L   Potassium 4.4 3.5 - 5.3 mEq/L   Chloride 104 96 - 112 mEq/L   CO2 26 19 - 32 mEq/L   Glucose, Bld 159 (H) 70 - 99 mg/dL   BUN 15 6 - 23 mg/dL   Creat 0.93 0.50 - 1.35 mg/dL   Calcium 8.9 8.4 - 10.5 mg/dL  Results for orders placed or performed during the hospital encounter of 09/07/14 (from the past 8736 hour(s))  CBC with Differential/Platelet   Collection Time: 09/07/14 10:50 PM  Result Value Ref Range   WBC 5.8 4.0 - 10.5 K/uL   RBC 4.60 4.22 - 5.81 MIL/uL   Hemoglobin 14.3 13.0 - 17.0 g/dL   HCT 41.7 39.0 - 52.0 %   MCV 90.7 78.0 - 100.0 fL   MCH 31.1 26.0 - 34.0 pg   MCHC 34.3 30.0 - 36.0 g/dL   RDW 12.5 11.5 - 15.5 %   Platelets  184 150 - 400 K/uL   Neutrophils Relative % 48 43 - 77 %   Neutro Abs 2.9 1.7 - 7.7 K/uL   Lymphocytes Relative 41 12 - 46 %   Lymphs Abs 2.4 0.7 - 4.0 K/uL   Monocytes Relative 8 3 - 12 %   Monocytes Absolute 0.5 0.1 - 1.0 K/uL   Eosinophils Relative 3 0 - 5 %   Eosinophils Absolute 0.2 0.0 - 0.7 K/uL   Basophils Relative 0 0 - 1 %   Basophils Absolute 0.0 0.0 - 0.1 K/uL  Basic metabolic panel   Collection Time: 09/07/14 10:50 PM  Result Value Ref Range   Sodium 136 135 - 145 mmol/L   Potassium 3.3 (L) 3.5 -  5.1 mmol/L   Chloride 110 96 - 112 mmol/L   CO2 26 19 - 32 mmol/L   Glucose, Bld 179 (H) 70 - 99 mg/dL   BUN 16 6 - 23 mg/dL   Creatinine, Ser 0.98 0.50 - 1.35 mg/dL   Calcium 8.2 (L) 8.4 - 10.5 mg/dL   GFR calc non Af Amer >90 >90 mL/min   GFR calc Af Amer >90 >90 mL/min   Anion gap 0 (L) 5 - 15  Troponin I   Collection Time: 09/07/14 10:50 PM  Result Value Ref Range   Troponin I <0.03 <0.031 ng/mL  Troponin I   Collection Time: 09/08/14  1:32 AM  Result Value Ref Range   Troponin I <0.03 <0.031 ng/mL    Assessment:   AXIS I Generalized Anxiety Disorder, Major Depression, Recurrent severe and Panic Disorder  AXIS II Deferred  AXIS III Past Medical History  Diagnosis Date  . Chest pain     + palpitations; cath 2005- 30-40% mid LAD, 20% D1, 20% cx, OM, 20-30% RCA, and EF-55%  . COPD (chronic obstructive pulmonary disease)   . GERD (gastroesophageal reflux disease)   . Hyperlipemia   . Hypertension   . Depression   . Colitis 1990  . Tobacco abuse     1/2 pack per day  . Diabetes mellitus   . Gastric ulcer 2003; 2012    2003: + esophagitis; negative H.pylori serology  2012: Dr. Oneida Alar, mild gastritis, Bravo PH probe placement, negative H.pylori  . Hepatic steatosis   . Chronic low back pain   . Panic attacks   . Asthma      AXIS IV other psychosocial or environmental problems  AXIS V 41-50 serious symptoms   Treatment Plan/Recommendations: Laboratory:    Psychotherapy: supportive psychotherapy  Medications:  He will continue  Xanax to 0.5 mg 4 times a day for anxiety   Routine PRN Medications:  No  Consultations: none  Safety Concerns:  none  Other:  Return in 3 months     MEDICATIONS this encounter: Meds ordered this encounter  Medications  . ALPRAZolam (XANAX) 0.5 MG tablet    Sig: Take 1 tablet (0.5 mg total) by mouth 4 (four) times daily.    Dispense:  120 tablet    Refill:  2   Medical Decision Making Problem Points:  Established problem,  stable/improving (1), Review of last therapy session (1) and Review of psycho-social stressors (1) Data Points:  Review or order clinical lab tests (1) Review of medication regiment & side effects (2) Review of new medications or change in dosage (2)  I certify that outpatient services furnished can reasonably be expected to improve the patient's condition.   [name], MD

## 2014-12-09 ENCOUNTER — Encounter: Payer: Self-pay | Admitting: Gastroenterology

## 2014-12-09 ENCOUNTER — Ambulatory Visit (INDEPENDENT_AMBULATORY_CARE_PROVIDER_SITE_OTHER): Payer: Self-pay | Admitting: Gastroenterology

## 2014-12-09 VITALS — BP 121/71 | HR 63 | Temp 97.6°F | Ht 71.0 in | Wt 213.2 lb

## 2014-12-09 DIAGNOSIS — R109 Unspecified abdominal pain: Secondary | ICD-10-CM

## 2014-12-09 DIAGNOSIS — Z1211 Encounter for screening for malignant neoplasm of colon: Secondary | ICD-10-CM

## 2014-12-09 MED ORDER — DEXLANSOPRAZOLE 60 MG PO CPDR
60.0000 mg | DELAYED_RELEASE_CAPSULE | Freq: Every day | ORAL | Status: DC
Start: 2014-12-09 — End: 2015-01-24

## 2014-12-09 NOTE — Progress Notes (Signed)
Referring Provider: [name], PA-C Primary Care Physician:  Raiford Simmonds., PA-C  Primary GI: Dr. Oneida Alar   Chief Complaint  Patient presents with  . Follow-up  . Medication Refill    HPI:   Richard [name] is a 49 y.o. male presenting today with a history of chronic abdominal pain, GERD. HIDA scan in 2014 with normal EF but +reproduction of pain. Occasional RUQ discomfort with greasy foods. Gastritis on EGD in 2012.  Flutter in chest. Lays on left side and worse.No burning or indigestion. No dysphagia. Notes RUQ/LUQ pain that waxes and wanes. Worse if he eats a lot of food. Notes early satiety. No NSAIDs, no rectal bleeding. Greasy food makes worse.   Past Medical History  Diagnosis Date  . Chest pain     + palpitations; cath 2005- 30-40% mid LAD, 20% D1, 20% cx, OM, 20-30% RCA, and EF-55%  . COPD (chronic obstructive pulmonary disease)   . GERD (gastroesophageal reflux disease)   . Hyperlipemia   . Hypertension   . Depression   . Colitis 1990  . Tobacco abuse     1/2 pack per day  . Diabetes mellitus   . Gastric ulcer 2003; 2012    2003: + esophagitis; negative H.pylori serology  2012: Dr. Oneida Alar, mild gastritis, Bravo PH probe placement, negative H.pylori  . Hepatic steatosis   . Chronic low back pain   . Panic attacks   . Asthma     Past Surgical History  Procedure Laterality Date  . Colonoscopy  1990  . Shoulder surgery      Right acromioclavicular joint arthrosis  . Neck mass excision    . Bravo ph study  05/03/2011    EGB:TDVV gastritis/normal esophagus and duodenum  . Cardiac catheterization  2005  . Esophagogastroduodenoscopy  05/03/2011    OHY:WVPX gastritis    Current Outpatient Prescriptions  Medication Sig Dispense Refill  . acetaminophen (TYLENOL) 325 MG tablet Take 650 mg by mouth every 6 (six) hours as needed for pain or fever.     [name] albuterol (VENTOLIN HFA) 108 (90 BASE) MCG/ACT inhaler Inhale 2 puffs into the lungs every 6 (six) hours  as needed for wheezing or shortness of breath.     . ALPRAZolam (XANAX) 0.5 MG tablet Take 1 tablet (0.5 mg total) by mouth 4 (four) times daily. 120 tablet 2  . aspirin 81 MG tablet Take 81 mg by mouth at bedtime.     . cyclobenzaprine (FLEXERIL) 10 MG tablet Take 1 tablet (10 mg total) by mouth 3 (three) times daily as needed for muscle spasms. 30 tablet 2  . esomeprazole (NEXIUM) 40 MG capsule Take 1 capsule (40 mg total) by mouth 2 (two) times daily before a meal. 180 capsule 3  . Fluticasone-Salmeterol (ADVAIR) 250-50 MCG/DOSE AEPB Inhale 1 puff into the lungs every 12 (twelve) hours.    [name] HYDROcodone-acetaminophen (NORCO/VICODIN) 5-325 MG per tablet Take 1 tablet by mouth every 6 (six) hours as needed for pain. 90 tablet 1  . ibuprofen (ADVIL,MOTRIN) 600 MG tablet Take 1 tablet (600 mg total) by mouth every 8 (eight) hours as needed for moderate pain. 30 tablet 1  . lisinopril (PRINIVIL,ZESTRIL) 10 MG tablet Take 1 tablet (10 mg total) by mouth daily. 90 tablet 1  . lovastatin (MEVACOR) 20 MG tablet Take 1 tablet (20 mg total) by mouth daily at 6 PM. 30 tablet 9  . metFORMIN (GLUCOPHAGE) 500 MG tablet Take 1 tablet (500 mg total) by mouth  2 (two) times daily with a meal. 30 tablet 2  . metoprolol succinate (TOPROL-XL) 50 MG 24 hr tablet TAKE ONE & ONE-HALF TABLETS BY MOUTH ONCE DAILY 45 tablet 11  . nitroGLYCERIN (NITROSTAT) 0.4 MG SL tablet Place 1 tablet (0.4 mg total) under the tongue every 5 (five) minutes as needed for chest pain. 25 tablet 3   No current facility-administered medications for this visit.    Allergies as of 12/09/2014 - Review Complete 12/09/2014  Allergen Reaction Noted  . Mushroom ext cmplx(shiitake-reishi-mait) Anaphylaxis 03/22/2011  . Penicillins Anaphylaxis     Family History  Problem Relation Age of Onset  . Lung cancer Mother   . Alcohol abuse Mother   . Heart attack Father 50  . Diabetes Father   . Alcohol abuse Father   . Hypertension Brother   .  Colon cancer Neg Hx   . Drug abuse Neg Hx   . Bipolar disorder Neg Hx   . OCD Neg Hx   . Paranoid behavior Neg Hx   . Schizophrenia Neg Hx   . Sexual abuse Neg Hx   . Physical abuse Neg Hx   . Hypertension Brother   . Anxiety disorder Sister   . Depression Sister   . Dementia Paternal Uncle   . Dementia Cousin   . Anxiety disorder Sister   . Heart attack Brother 38  . Diabetes Brother   . Hypertension Brother   . Seizures Brother   . ADD / ADHD Daughter     History   Social History  . Marital Status: Married    Spouse Name: N/A  . Number of Children: N/A  . Years of Education: N/A   Occupational History  . full time    Social History Main Topics  . Smoking status: Current Every Day Smoker -- 0.50 packs/day for 29 years    Types: Cigarettes    Start date: 07/17/1982  . Smokeless tobacco: Never Used     Comment: on the verge of quitting as of 01-03-13  . Alcohol Use: No  . Drug Use: No  . Sexual Activity: Yes   Other Topics Concern  . None   Social History Narrative    Review of Systems: Negative unless mentioned in HPI.  Physical Exam: BP 121/71 mmHg  Pulse 63  Temp(Src) 97.6 F (36.4 C)  Ht 5\' 11"  (1.803 m)  Wt 213 lb 3.2 oz (96.707 kg)  BMI 29.75 kg/m2 General:   Alert and oriented. No distress noted. Pleasant and cooperative.  Head:  Normocephalic and atraumatic. Eyes:  Conjuctiva clear without scleral icterus. Mouth:  Oral mucosa pink and moist. Good dentition. No lesions. Heart:  S1, S2 present without murmurs, rubs, or gallops. Regular rate and rhythm. Abdomen:  +BS, soft, non-tender and non-distended. No rebound or guarding. No HSM or masses noted. Msk:  Symmetrical without gross deformities. Normal posture. Extremities:  Without edema. Neurologic:  Alert and  oriented x4;  grossly normal neurologically. Skin:  Intact without significant lesions or rashes. Psych:  Alert and cooperative. Normal mood and affect.

## 2014-12-09 NOTE — Patient Instructions (Signed)
I have given you samples of Dexilant and forms for patient assistance.   We will see you back in 6-8 weeks!

## 2014-12-18 DIAGNOSIS — Z1211 Encounter for screening for malignant neoplasm of colon: Secondary | ICD-10-CM | POA: Insufficient documentation

## 2014-12-18 DIAGNOSIS — R109 Unspecified abdominal pain: Secondary | ICD-10-CM | POA: Insufficient documentation

## 2014-12-18 NOTE — Assessment & Plan Note (Signed)
Needs routine screening in near future. Return in 6 weeks to discuss.

## 2014-12-18 NOTE — Assessment & Plan Note (Signed)
49 year old male with chronic RUQ pain, gallbladder remains in situ, last EGD in 2012 with gastritis. No dysphagia. May need repeat EGD with further evaluation for biliary etiology if negative. Unable to proceed at this time but will return in 6 weeks (at time of wife's appt as well) to discuss further. Will trial Dexilant samples in interim, as Nexium is no longer covered via patient assistance.

## 2014-12-23 NOTE — Progress Notes (Signed)
CC'ED TO PCP 

## 2015-01-20 ENCOUNTER — Encounter: Payer: Self-pay | Admitting: Gastroenterology

## 2015-01-20 ENCOUNTER — Other Ambulatory Visit: Payer: Self-pay

## 2015-01-20 ENCOUNTER — Ambulatory Visit (INDEPENDENT_AMBULATORY_CARE_PROVIDER_SITE_OTHER): Payer: Self-pay | Admitting: Gastroenterology

## 2015-01-20 VITALS — BP 127/77 | HR 65 | Temp 97.6°F | Ht 71.0 in | Wt 212.6 lb

## 2015-01-20 DIAGNOSIS — Z1211 Encounter for screening for malignant neoplasm of colon: Secondary | ICD-10-CM

## 2015-01-20 DIAGNOSIS — K219 Gastro-esophageal reflux disease without esophagitis: Secondary | ICD-10-CM

## 2015-01-20 NOTE — Patient Instructions (Signed)
Continue Nexium 20 mg in the morning and evening, 30 minutes before breakfast and dinner.   We have scheduled an xray of your esophagus to see if you need an upper endoscopy.  You have been scheduled for a colonoscopy with possible upper endoscopy with Dr. Oneida Alar if needed

## 2015-01-20 NOTE — Progress Notes (Signed)
Referring Provider: Raiford Simmonds., PA-C Primary Care Physician:  Raiford Simmonds., PA-C Primary GI: Dr. Oneida Alar   Chief Complaint  Patient presents with  . Follow-up    HPI:   Richard [name] is a 49 y.o. male presenting today with a history of chronic abdominal pain, GERD. HIDA scan in 2014 with normal EF but +reproduction of pain. Occasional RUQ discomfort with greasy foods. Gastritis on EGD in 2012. Dexilant provided at last appointment. Due for initial screening colonoscopy now.   States he was allergic to Dexilant. Felt like his throat was closing up and swelling. Went back to Nexium onc daily. Had headaches as well after taking Dexilant. Stopped 4-5 days ago. Globus sensation noted but improved with resuming Nexium. Abdominal discomfort improved. Has BM twice daily, no rectal bleeding.   Past Medical History  Diagnosis Date  . Chest pain     + palpitations; cath 2005- 30-40% mid LAD, 20% D1, 20% cx, OM, 20-30% RCA, and EF-55%  . COPD (chronic obstructive pulmonary disease)   . GERD (gastroesophageal reflux disease)   . Hyperlipemia   . Hypertension   . Depression   . Colitis 1990  . Tobacco abuse     1/2 pack per day  . Diabetes mellitus   . Gastric ulcer 2003; 2012    2003: + esophagitis; negative H.pylori serology  2012: Dr. Oneida Alar, mild gastritis, Bravo PH probe placement, negative H.pylori  . Hepatic steatosis   . Chronic low back pain   . Panic attacks   . Asthma     Past Surgical History  Procedure Laterality Date  . Colonoscopy  1990  . Shoulder surgery      Right acromioclavicular joint arthrosis  . Neck mass excision    . Bravo ph study  05/03/2011    EXH:BZJI gastritis/normal esophagus and duodenum  . Cardiac catheterization  2005  . Esophagogastroduodenoscopy  05/03/2011    RCV:ELFY gastritis    Current Outpatient Prescriptions  Medication Sig Dispense Refill  . acetaminophen (TYLENOL) 325 MG tablet Take 650 mg by mouth every 6 (six) hours as  needed for pain or fever.     [name] albuterol (VENTOLIN HFA) 108 (90 BASE) MCG/ACT inhaler Inhale 2 puffs into the lungs every 6 (six) hours as needed for wheezing or shortness of breath.     . ALPRAZolam (XANAX) 0.5 MG tablet Take 1 tablet (0.5 mg total) by mouth 4 (four) times daily. 120 tablet 2  . aspirin 81 MG tablet Take 81 mg by mouth at bedtime.     . cyclobenzaprine (FLEXERIL) 10 MG tablet Take 1 tablet (10 mg total) by mouth 3 (three) times daily as needed for muscle spasms. 30 tablet 2  . esomeprazole (NEXIUM) 40 MG capsule Take 1 capsule (40 mg total) by mouth 2 (two) times daily before a meal. 180 capsule 3  . Fluticasone-Salmeterol (ADVAIR) 250-50 MCG/DOSE AEPB Inhale 1 puff into the lungs every 12 (twelve) hours.    [name] HYDROcodone-acetaminophen (NORCO/VICODIN) 5-325 MG per tablet Take 1 tablet by mouth every 6 (six) hours as needed for pain. 90 tablet 1  . ibuprofen (ADVIL,MOTRIN) 600 MG tablet Take 1 tablet (600 mg total) by mouth every 8 (eight) hours as needed for moderate pain. 30 tablet 1  . lisinopril (PRINIVIL,ZESTRIL) 10 MG tablet Take 1 tablet (10 mg total) by mouth daily. 90 tablet 1  . lovastatin (MEVACOR) 20 MG tablet Take 1 tablet (20 mg total) by mouth daily at 6 PM. 30  tablet 9  . metFORMIN (GLUCOPHAGE) 500 MG tablet Take 1 tablet (500 mg total) by mouth 2 (two) times daily with a meal. 30 tablet 2  . metoprolol succinate (TOPROL-XL) 50 MG 24 hr tablet TAKE ONE & ONE-HALF TABLETS BY MOUTH ONCE DAILY 45 tablet 11  . nitroGLYCERIN (NITROSTAT) 0.4 MG SL tablet Place 1 tablet (0.4 mg total) under the tongue every 5 (five) minutes as needed for chest pain. 25 tablet 3   No current facility-administered medications for this visit.    Allergies as of 01/20/2015 - Review Complete 01/20/2015  Allergen Reaction Noted  . Dexilant [dexlansoprazole] Anaphylaxis 01/20/2015  . Mushroom ext cmplx(shiitake-reishi-mait) Anaphylaxis 03/22/2011  . Penicillins Anaphylaxis     Family  History  Problem Relation Age of Onset  . Lung cancer Mother   . Alcohol abuse Mother   . Heart attack Father 54  . Diabetes Father   . Alcohol abuse Father   . Hypertension Brother   . Colon cancer Neg Hx   . Drug abuse Neg Hx   . Bipolar disorder Neg Hx   . OCD Neg Hx   . Paranoid behavior Neg Hx   . Schizophrenia Neg Hx   . Sexual abuse Neg Hx   . Physical abuse Neg Hx   . Hypertension Brother   . Anxiety disorder Sister   . Depression Sister   . Dementia Paternal Uncle   . Dementia Cousin   . Anxiety disorder Sister   . Heart attack Brother 52  . Diabetes Brother   . Hypertension Brother   . Seizures Brother   . ADD / ADHD Daughter     History   Social History  . Marital Status: Married    Spouse Name: N/A  . Number of Children: N/A  . Years of Education: N/A   Occupational History  . full time    Social History Main Topics  . Smoking status: Current Every Day Smoker -- 0.50 packs/day for 29 years    Types: Cigarettes    Start date: 07/17/1982  . Smokeless tobacco: Never Used     Comment: on the verge of quitting as of 01-03-13  . Alcohol Use: No  . Drug Use: No  . Sexual Activity: Yes   Other Topics Concern  . None   Social History Narrative    Review of Systems: As mentioned in HPI  Physical Exam: BP 127/77 mmHg  Pulse 65  Temp(Src) 97.6 F (36.4 C)  Ht 5\' 11"  (1.803 m)  Wt 212 lb 9.6 oz (96.435 kg)  BMI 29.66 kg/m2 General:   Alert and oriented. No distress noted. Pleasant and cooperative.  Head:  Normocephalic and atraumatic. Eyes:  Conjuctiva clear without scleral icterus. Mouth:  Oral mucosa pink and moist. Good dentition. No lesions. Heart:  S1, S2 present without murmurs, rubs, or gallops. Regular rate and rhythm. Abdomen:  +BS, soft, non-tender and non-distended. No rebound or guarding. No HSM or masses noted. Msk:  Symmetrical without gross deformities. Normal posture. Extremities:  Without edema. Neurologic:  Alert and  oriented  x4;  grossly normal neurologically. Psych:  Alert and cooperative. Normal mood and affect.

## 2015-01-24 NOTE — Assessment & Plan Note (Signed)
Chronic GERD with improvement in switching from Dexilant to Nexium. Globus sensation also improved with Nexium. Last EGD in 2012 with gastritis. Discussed repeat EGD due to persistent globus symptoms, but he desires to pursue non-invasive testing first. Will pursue BPE first and if any concerning findings, recommend EGD+/- dil  with Propofol at time of colonoscopy.

## 2015-01-24 NOTE — Assessment & Plan Note (Signed)
49 year old male due for routine initial screening colonoscopy without concerning lower GI symptoms. Due to polypharmacy, will need Propofol.  Proceed with colonoscopy (with Propofol) with Dr. Oneida Alar in the near future. The risks, benefits, and alternatives have been discussed in detail with the patient. They state understanding and desire to proceed.

## 2015-01-26 NOTE — Progress Notes (Signed)
cc'ed to pcp °

## 2015-02-14 NOTE — Progress Notes (Signed)
REVIEWED-NO ADDITIONAL RECOMMENDATIONS. 

## 2015-03-03 ENCOUNTER — Ambulatory Visit (INDEPENDENT_AMBULATORY_CARE_PROVIDER_SITE_OTHER): Payer: Self-pay | Admitting: Psychiatry

## 2015-03-03 ENCOUNTER — Encounter (HOSPITAL_COMMUNITY): Payer: Self-pay | Admitting: Psychiatry

## 2015-03-03 VITALS — BP 126/75 | HR 55 | Ht 71.0 in | Wt 212.6 lb

## 2015-03-03 DIAGNOSIS — F410 Panic disorder [episodic paroxysmal anxiety] without agoraphobia: Secondary | ICD-10-CM

## 2015-03-03 DIAGNOSIS — F419 Anxiety disorder, unspecified: Secondary | ICD-10-CM

## 2015-03-03 DIAGNOSIS — F32A Depression, unspecified: Secondary | ICD-10-CM

## 2015-03-03 DIAGNOSIS — G8929 Other chronic pain: Secondary | ICD-10-CM

## 2015-03-03 DIAGNOSIS — M545 Low back pain: Secondary | ICD-10-CM

## 2015-03-03 DIAGNOSIS — F17200 Nicotine dependence, unspecified, uncomplicated: Secondary | ICD-10-CM

## 2015-03-03 DIAGNOSIS — F411 Generalized anxiety disorder: Secondary | ICD-10-CM

## 2015-03-03 DIAGNOSIS — F4001 Agoraphobia with panic disorder: Secondary | ICD-10-CM

## 2015-03-03 DIAGNOSIS — F329 Major depressive disorder, single episode, unspecified: Secondary | ICD-10-CM

## 2015-03-03 MED ORDER — ALPRAZOLAM 0.5 MG PO TABS: 0.5000 mg | ORAL_TABLET | Freq: Four times a day (QID) | ORAL | Status: DC

## 2015-03-03 NOTE — Progress Notes (Signed)
Patient ID: Richard [name], male   DOB: 07-23-1965, 49 y.o.   [name]: [id number] Patient ID: Richard [name], male   DOB: 03/02/1966, 49 y.o.   [name]: [id number] Patient ID: Richard [name], male   DOB: Jul 31, 1965, 49 y.o.   [name]: [id number] Patient ID: Richard [name], male   DOB: 13-May-1966, 49 y.o.   [name]: [id number] Patient ID: Richard [name], male   DOB: 1966/02/17, 49 y.o.   [name]: [id number] Patient ID: Richard [name], male   DOB: 1965/12/26, 49 y.o.   [name]: [id number] Patient ID: Richard [name], male   DOB: 1966-04-13, 49 y.o.   [name]: [id number] Patient ID: Richard [name], male   DOB: 05-Oct-1965, 49 y.o.   [name]: [id number] Patient ID: Richard [name], male   DOB: October 31, 1965, 49 y.o.   [name]: [id number] Patient ID: Richard [name], male   DOB: 02-04-1966, 49 y.o.   [name]: [id number] Patient ID: Richard [name], male   DOB: 1965-11-11, 49 y.o.   [name]: [id number] Patient ID: Richard [name], male   DOB: 11/09/1965, 49 y.o.   [name]: [id number] [facility] [id number] Progress Note Richard [name]: [id number] DOB: [birth year] Age: 49 y.o.  Date: 03/03/2015  Chief Complaint  Patient presents with  . Anxiety  . Follow-up   History of Chief Complaint:   This 49 year old Caucasian male who came for his followup appointment. He is currently living with his wife in [place]. He is unemployed and applying for disability.  The patient states that he has been employed for 5 years. 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 He got injured on the job and both shoulders were torn. He has not been able to work ever since and he feels very badly about this. 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  Since he's not been able to work the patient has been having increasingly depressed and anxious. He feels like his body gives out when he is trying to exert himself even in a minor way. He's been in the ER recently for chest pain which was ruled out as a panic attack. He also  saw cardiologist this week and will be having a stress test. His blood pressure was too low and his lisinopril was cut down. He is also trying to quit smoking by using electronic cigarettes.  The patient returns after 3 months. He tells me he is doing okay but barely getting by financially. He has applied for disability 4 times and has never gotten it. They're surviving on his wife's income from her [employer] job. He does not voice take care of himself and drinks [facility] instead of eating food which is probably worse thing for him since he is diabetic. He understands the implications of this but it's difficult for him to change. He states his mood is been fairly stable and that the Xanax helps his anxiety. Anxiety Presents for follow-up visit. Symptoms include nervous/anxious behavior.     Review of Systems  Constitutional: Positive for fatigue.  Gastrointestinal: Negative.   Musculoskeletal: Positive for back pain.  Skin: Negative.   Psychiatric/Behavioral: Positive for sleep disturbance and dysphoric mood. The patient is nervous/anxious.     Physical Exam Vitals: BP 126/75 mmHg  Pulse 55  Ht 5\' 11"  (1.803 m)  Wt 212 lb 9.6 oz (96.435 kg)  BMI 29.66 kg/m2   Past Psychiatric History: Diagnosis:  none  Hospitalizations: none  Outpatient Care: none  Substance Abuse Care: none  Self-Mutilation: none  Suicidal Attempts: none  Violent Behaviors: none   Allergies: Allergies  Allergen Reactions  . Dexilant [Dexlansoprazole] Anaphylaxis  . Mushroom Ext Cmplx(Shiitake-Reishi-Mait) Anaphylaxis    Rapid heart rate.  . Penicillins Anaphylaxis   Medical History: Past Medical History  Diagnosis Date  . Chest pain     + palpitations; cath 2005- 30-40% mid LAD, 20% D1, 20% cx, OM, 20-30% RCA, and EF-55%  . COPD (chronic obstructive pulmonary disease)   . GERD (gastroesophageal reflux disease)   . Hyperlipemia   . Hypertension   . Depression   . Colitis 1990  . Tobacco abuse      1/2 pack per day  . Diabetes mellitus   . Gastric ulcer 2003; 2012    2003: + esophagitis; negative H.pylori serology  2012: Dr. Oneida Alar, mild gastritis, Bravo PH probe placement, negative H.pylori  . Hepatic steatosis   . Chronic low back pain   . Panic attacks   . Asthma    Surgical History: Past Surgical History  Procedure Laterality Date  . Colonoscopy  1990  . Shoulder surgery      Right acromioclavicular joint arthrosis  . Neck mass excision    . Bravo ph study  05/03/2011    EPP:IRJJ gastritis/normal esophagus and duodenum  . Cardiac catheterization  2005  . Esophagogastroduodenoscopy  05/03/2011    OAC:ZYSA gastritis   Family History: family history includes ADD / ADHD in his daughter; Alcohol abuse in his father and mother; Anxiety disorder in his sister and sister; Dementia in his cousin and paternal uncle; Depression in his sister; Diabetes in his brother and father; Heart attack (age of onset: 53) in his brother; Heart attack (age of onset: 19) in his father; Hypertension in his brother, brother, and brother; Lung cancer in his mother; Seizures in his brother. There is no history of Colon cancer, Drug abuse, Bipolar disorder, OCD, Paranoid behavior, Schizophrenia, Sexual abuse, or Physical abuse. Reviewed again today and nothing new.   Current Medications:  Current Outpatient Prescriptions  Medication Sig Dispense Refill  . acetaminophen (TYLENOL) 325 MG tablet Take 650 mg by mouth every 6 (six) hours as needed for pain or fever.     [name] albuterol (VENTOLIN HFA) 108 (90 BASE) MCG/ACT inhaler Inhale 2 puffs into the lungs every 6 (six) hours as needed for wheezing or shortness of breath.     . ALPRAZolam (XANAX) 0.5 MG tablet Take 1 tablet (0.5 mg total) by mouth 4 (four) times daily. 120 tablet 2  . aspirin 81 MG tablet Take 81 mg by mouth at bedtime.     . cyclobenzaprine (FLEXERIL) 10 MG tablet Take 1 tablet (10 mg total) by mouth 3 (three) times daily as needed for  muscle spasms. 30 tablet 2  . esomeprazole (NEXIUM) 40 MG capsule Take 1 capsule (40 mg total) by mouth 2 (two) times daily before a meal. 180 capsule 3  . Fluticasone-Salmeterol (ADVAIR) 250-50 MCG/DOSE AEPB Inhale 1 puff into the lungs every 12 (twelve) hours.    [name] HYDROcodone-acetaminophen (NORCO/VICODIN) 5-325 MG per tablet Take 1 tablet by mouth every 6 (six) hours as needed for pain. 90 tablet 1  . ibuprofen (ADVIL,MOTRIN) 600 MG tablet Take 1 tablet (600 mg total) by mouth every 8 (eight) hours as needed for moderate pain. 30 tablet 1  . lisinopril (PRINIVIL,ZESTRIL) 10 MG tablet Take 1 tablet (10 mg total) by mouth daily.  90 tablet 1  . lovastatin (MEVACOR) 20 MG tablet Take 1 tablet (20 mg total) by mouth daily at 6 PM. 30 tablet 9  . metFORMIN (GLUCOPHAGE) 500 MG tablet Take 1 tablet (500 mg total) by mouth 2 (two) times daily with a meal. 30 tablet 2  . metoprolol succinate (TOPROL-XL) 50 MG 24 hr tablet TAKE ONE & ONE-HALF TABLETS BY MOUTH ONCE DAILY 45 tablet 11  . nitroGLYCERIN (NITROSTAT) 0.4 MG SL tablet Place 1 tablet (0.4 mg total) under the tongue every 5 (five) minutes as needed for chest pain. 25 tablet 3   No current facility-administered medications for this visit.    Previous Psychotropic Medications: Medication Dose   Xanax     Substance Abuse History in the last 12 months: Substance Age of 1st Use Last Use Amount Specific Type  Nicotine  17  2 hours ago  1  cigarette  Alcohol  22  38      Cannabis  none        Opiates  36  today  2.5 mg  hyrdocodone  Cocaine  none        Methamphetamines  none        LSD  none        Ecstasy  none         Benzodiazepines  22  started in early thrities  0.5 mg  Xanax  Caffeine  childhood  this AM  1 cup  Mt Dew  Inhalants  none        Others:      Sugar  11  this AM  20 tsps  in the [place] Consequences of Substance Abuse: pain Legal Consequences of Substance Abuse: none Family Consequences of Substance Abuse:  none Blackouts:  No DT's:  No Withdrawal Symptoms:  Yes Tremors  Social History: Patient lives with his life, daughter and her husband with 2 grandbabies.  He has 2 sons and the daughter.  He is a Programmer, systems.    Mental Status Examination/Evaluation: Objective:  Appearance: Casual  Eye Contact::  Good  Speech:  Clear and Coherent  Volume:  Normal  Mood:  Fairly good somewhat anxious   Affect:  Congruent  Thought Process:  Coherent, Intact and Linear  Orientation:  Full (Time, Place, and Person)  Thought Content:  WDL  Suicidal Thoughts:  No  Homicidal Thoughts:  No  Judgement:  Good  Insight:  Fair  Psychomotor Activity:  Normal  Akathisia:  No  Handed:  Right  AIMS (if indicated):    Assets:  Communication Skills Desire for Improvement   Lab Results:  Results for orders placed or performed in visit on 10/16/14 (from the past 8736 hour(s))  Basic Metabolic Panel (BMET)   Collection Time: 10/19/14  9:07 AM  Result Value Ref Range   Sodium 139 135 - 145 mEq/L   Potassium 4.4 3.5 - 5.3 mEq/L   Chloride 104 96 - 112 mEq/L   CO2 26 19 - 32 mEq/L   Glucose, Bld 159 (H) 70 - 99 mg/dL   BUN 15 6 - 23 mg/dL   Creat 0.93 0.50 - 1.35 mg/dL   Calcium 8.9 8.4 - 10.5 mg/dL  Results for orders placed or performed during the hospital encounter of 09/07/14 (from the past 8736 hour(s))  CBC with Differential/Platelet   Collection Time: 09/07/14 10:50 PM  Result Value Ref Range   WBC 5.8 4.0 - 10.5 K/uL   RBC 4.60 4.22 -  5.81 MIL/uL   Hemoglobin 14.3 13.0 - 17.0 g/dL   HCT 41.7 39.0 - 52.0 %   MCV 90.7 78.0 - 100.0 fL   MCH 31.1 26.0 - 34.0 pg   MCHC 34.3 30.0 - 36.0 g/dL   RDW 12.5 11.5 - 15.5 %   Platelets 184 150 - 400 K/uL   Neutrophils Relative % 48 43 - 77 %   Neutro Abs 2.9 1.7 - 7.7 K/uL   Lymphocytes Relative 41 12 - 46 %   Lymphs Abs 2.4 0.7 - 4.0 K/uL   Monocytes Relative 8 3 - 12 %   Monocytes Absolute 0.5 0.1 - 1.0 K/uL   Eosinophils Relative 3 0 - 5 %    Eosinophils Absolute 0.2 0.0 - 0.7 K/uL   Basophils Relative 0 0 - 1 %   Basophils Absolute 0.0 0.0 - 0.1 K/uL  Basic metabolic panel   Collection Time: 09/07/14 10:50 PM  Result Value Ref Range   Sodium 136 135 - 145 mmol/L   Potassium 3.3 (L) 3.5 - 5.1 mmol/L   Chloride 110 96 - 112 mmol/L   CO2 26 19 - 32 mmol/L   Glucose, Bld 179 (H) 70 - 99 mg/dL   BUN 16 6 - 23 mg/dL   Creatinine, Ser 0.98 0.50 - 1.35 mg/dL   Calcium 8.2 (L) 8.4 - 10.5 mg/dL   GFR calc non Af Amer >90 >90 mL/min   GFR calc Af Amer >90 >90 mL/min   Anion gap 0 (L) 5 - 15  Troponin I   Collection Time: 09/07/14 10:50 PM  Result Value Ref Range   Troponin I <0.03 <0.031 ng/mL  Troponin I   Collection Time: 09/08/14  1:32 AM  Result Value Ref Range   Troponin I <0.03 <0.031 ng/mL    Assessment:   AXIS I Generalized Anxiety Disorder, Major Depression, Recurrent severe and Panic Disorder  AXIS II Deferred  AXIS III Past Medical History  Diagnosis Date  . Chest pain     + palpitations; cath 2005- 30-40% mid LAD, 20% D1, 20% cx, OM, 20-30% RCA, and EF-55%  . COPD (chronic obstructive pulmonary disease)   . GERD (gastroesophageal reflux disease)   . Hyperlipemia   . Hypertension   . Depression   . Colitis 1990  . Tobacco abuse     1/2 pack per day  . Diabetes mellitus   . Gastric ulcer 2003; 2012    2003: + esophagitis; negative H.pylori serology  2012: Dr. Oneida Alar, mild gastritis, Bravo PH probe placement, negative H.pylori  . Hepatic steatosis   . Chronic low back pain   . Panic attacks   . Asthma      AXIS IV other psychosocial or environmental problems  AXIS V 41-50 serious symptoms   Treatment Plan/Recommendations: Laboratory:    Psychotherapy: supportive psychotherapy  Medications:  He will continue  Xanax to 0.5 mg 4 times a day for anxiety   Routine PRN Medications:  No  Consultations: none  Safety Concerns:  none  Other:  Return in 3 months     MEDICATIONS this encounter: Meds  ordered this encounter  Medications  . ALPRAZolam (XANAX) 0.5 MG tablet    Sig: Take 1 tablet (0.5 mg total) by mouth 4 (four) times daily.    Dispense:  120 tablet    Refill:  2   Medical Decision Making Problem Points:  Established problem, stable/improving (1), Review of last therapy session (1) and Review of psycho-social stressors (1)  Data Points:  Review or order clinical lab tests (1) Review of medication regiment & side effects (2) Review of new medications or change in dosage (2)  I certify that outpatient services furnished can reasonably be expected to improve the patient's condition.   [name], MD

## 2015-03-22 ENCOUNTER — Other Ambulatory Visit: Payer: Self-pay | Admitting: Cardiovascular Disease

## 2015-04-27 ENCOUNTER — Ambulatory Visit (INDEPENDENT_AMBULATORY_CARE_PROVIDER_SITE_OTHER): Payer: Self-pay | Admitting: Cardiovascular Disease

## 2015-04-27 ENCOUNTER — Encounter: Payer: Self-pay | Admitting: Cardiovascular Disease

## 2015-04-27 VITALS — BP 110/72 | HR 66 | Ht 71.0 in | Wt 217.0 lb

## 2015-04-27 DIAGNOSIS — R002 Palpitations: Secondary | ICD-10-CM

## 2015-04-27 DIAGNOSIS — R0609 Other forms of dyspnea: Secondary | ICD-10-CM

## 2015-04-27 DIAGNOSIS — M4806 Spinal stenosis, lumbar region: Secondary | ICD-10-CM

## 2015-04-27 DIAGNOSIS — I10 Essential (primary) hypertension: Secondary | ICD-10-CM

## 2015-04-27 DIAGNOSIS — E782 Mixed hyperlipidemia: Secondary | ICD-10-CM

## 2015-04-27 DIAGNOSIS — F17200 Nicotine dependence, unspecified, uncomplicated: Secondary | ICD-10-CM

## 2015-04-27 DIAGNOSIS — Z23 Encounter for immunization: Secondary | ICD-10-CM

## 2015-04-27 DIAGNOSIS — E785 Hyperlipidemia, unspecified: Secondary | ICD-10-CM

## 2015-04-27 DIAGNOSIS — M48062 Spinal stenosis, lumbar region with neurogenic claudication: Secondary | ICD-10-CM

## 2015-04-27 DIAGNOSIS — J449 Chronic obstructive pulmonary disease, unspecified: Secondary | ICD-10-CM

## 2015-04-27 DIAGNOSIS — I25118 Atherosclerotic heart disease of native coronary artery with other forms of angina pectoris: Secondary | ICD-10-CM

## 2015-04-27 NOTE — Patient Instructions (Signed)
   Spirometry breathing test   Stop Lisinopril.  Lab for Lipids - Reminder:  Nothing to eat or drink after 12 midnight prior to labs.  - order given today. Office will contact with results via phone or letter.    Follow up in  3-4 months.

## 2015-04-27 NOTE — Progress Notes (Signed)
Patient ID: Richard [name], male   DOB: 1965/09/27, 49 y.o.   MRN: [id number]      SUBJECTIVE: Richard [name] has a history of multiple medical problems, including mild to moderate CAD by cath in 2005, HTN, COPD, chronic chest pain, tobacco abuse, GERD, diabetes mellitus, chronic low back pain due to severe lumbar arthritis, anxiety/depression, panic attacks, palpitations, and hyperlipidemia.  He underwent a low risk Lexiscan Cardiolite stress test in September 2014, with no evidence of myocardial ischemia or scar and normal left ventricular systolic function. He follows with a psychiatrist.  He has been experiencing progressive exertional dyspnea over the past 3 years. His activities of daily living have been significantly limited due to this. He smokes 10 cigarettes daily and sometimes less.  He also has severe spinal pain from his cervical spine to his thoracic spine and will soon be establishing primary care with Dr. Karie Kirks.  He denies exertional chest pain. He has frequent palpitations which occur one hour after taking his medications and wonders if lisinopril is the cause of this.  Review of Systems: As per "subjective", otherwise negative.  Allergies  Allergen Reactions  . Dexilant [Dexlansoprazole] Anaphylaxis  . Mushroom Ext Cmplx(Shiitake-Reishi-Mait) Anaphylaxis    Rapid heart rate.  . Penicillins Anaphylaxis    Current Outpatient Prescriptions  Medication Sig Dispense Refill  . acetaminophen (TYLENOL) 325 MG tablet Take 650 mg by mouth every 6 (six) hours as needed for pain or fever.     [name] albuterol (VENTOLIN HFA) 108 (90 BASE) MCG/ACT inhaler Inhale 2 puffs into the lungs every 6 (six) hours as needed for wheezing or shortness of breath.     . ALPRAZolam (XANAX) 0.5 MG tablet Take 1 tablet (0.5 mg total) by mouth 4 (four) times daily. 120 tablet 2  . aspirin 81 MG tablet Take 81 mg by mouth at bedtime.     . cyclobenzaprine (FLEXERIL) 10 MG tablet Take 1 tablet (10 mg total)  by mouth 3 (three) times daily as needed for muscle spasms. 30 tablet 2  . esomeprazole (NEXIUM) 40 MG capsule Take 1 capsule (40 mg total) by mouth 2 (two) times daily before a meal. 180 capsule 3  . Fluticasone-Salmeterol (ADVAIR) 250-50 MCG/DOSE AEPB Inhale 1 puff into the lungs every 12 (twelve) hours.    [name] HYDROcodone-acetaminophen (NORCO/VICODIN) 5-325 MG per tablet Take 1 tablet by mouth every 6 (six) hours as needed for pain. 90 tablet 1  . ibuprofen (ADVIL,MOTRIN) 600 MG tablet Take 1 tablet (600 mg total) by mouth every 8 (eight) hours as needed for moderate pain. 30 tablet 1  . lisinopril (PRINIVIL,ZESTRIL) 10 MG tablet Take 1 tablet (10 mg total) by mouth daily. 90 tablet 1  . lovastatin (MEVACOR) 20 MG tablet TAKE ONE TABLET BY MOUTH ONCE DAILY AT 6:OO PM 30 tablet 6  . metFORMIN (GLUCOPHAGE) 500 MG tablet Take 1 tablet (500 mg total) by mouth 2 (two) times daily with a meal. 30 tablet 2  . metoprolol succinate (TOPROL-XL) 50 MG 24 hr tablet TAKE ONE & ONE-HALF TABLETS BY MOUTH ONCE DAILY 45 tablet 11  . nitroGLYCERIN (NITROSTAT) 0.4 MG SL tablet Place 1 tablet (0.4 mg total) under the tongue every 5 (five) minutes as needed for chest pain. 25 tablet 3   No current facility-administered medications for this visit.    Past Medical History  Diagnosis Date  . Chest pain     + palpitations; cath 2005- 30-40% mid LAD, 20% D1, 20% cx, OM, 20-30% RCA,  and EF-55%  . COPD (chronic obstructive pulmonary disease) ([place])   . GERD (gastroesophageal reflux disease)   . Hyperlipemia   . Hypertension   . Depression   . Colitis 1990  . Tobacco abuse     1/2 pack per day  . Diabetes mellitus   . Gastric ulcer 2003; 2012    2003: + esophagitis; negative H.pylori serology  2012: Dr. Oneida Alar, mild gastritis, Bravo PH probe placement, negative H.pylori  . Hepatic steatosis   . Chronic low back pain   . Panic attacks   . Asthma     Past Surgical History  Procedure Laterality Date  .  Colonoscopy  1990  . Shoulder surgery      Right acromioclavicular joint arthrosis  . Neck mass excision    . Bravo ph study  05/03/2011    [id number] gastritis/normal esophagus and duodenum  . Cardiac catheterization  2005  . Esophagogastroduodenoscopy  05/03/2011    [id number] gastritis    Social History   Social History  . Marital Status: Married    Spouse Name: N/A  . Number of Children: N/A  . Years of Education: N/A   Occupational History  . full time    Social History Main Topics  . Smoking status: Current Every Day Smoker -- 0.50 packs/day for 29 years    Types: Cigarettes    Start date: 07/17/1982  . Smokeless tobacco: Never Used     Comment: on the verge of quitting as of 01-03-13  . Alcohol Use: No  . Drug Use: No  . Sexual Activity: Yes   Other Topics Concern  . Not on file   Social History Narrative     Filed Vitals:   04/27/15 1354  BP: 110/72  Pulse: 66  Height: 5\' 11"  (1.803 m)  Weight: 217 lb (98.431 kg)  SpO2: 96%    PHYSICAL EXAM General: NAD HEENT: Normal. Neck: No JVD, no thyromegaly. Lungs: Clear to auscultation bilaterally with normal respiratory effort. CV: Nondisplaced PMI.  Regular rate and rhythm, normal S1/S2, no S3/S4, no murmur. No pretibial or periankle edema.  No carotid bruit.    Abdomen: Firm, obese.  Neurologic: Alert and oriented x 3.  Psych: Normal affect. Skin: Normal. Musculoskeletal: No gross deformities. Extremities: No clubbing or cyanosis.   ECG: Most recent ECG reviewed.      ASSESSMENT AND PLAN: 1. CAD: Normal Lexiscan Cardiolite stress test in 03/2013. No changes to therapy which includes ASA, metoprolol, and statin.  2. Essential HTN: Controlled on current therapy. Will monitor given cessation of lisinopril (see below).  3. Palpitations: Takes metoprolol. No indication for cardiac Holter or event monitoring. Will stop lisinopril to see if this is the cause.  4. Hyperlipidemia: On lovastatin. Will  obtain lipids.  5. Exertional dyspnea: Given his tobacco abuse, his COPD may have worsened. Will obtain spirometry.  Dispo: f/u 3-4 months.  Kate Sable, M.D., F.A.C.C.

## 2015-04-28 ENCOUNTER — Encounter (HOSPITAL_COMMUNITY): Payer: Self-pay

## 2015-05-05 ENCOUNTER — Ambulatory Visit (HOSPITAL_COMMUNITY)
Admission: RE | Admit: 2015-05-05 | Discharge: 2015-05-05 | Disposition: A | Discharge status: Home / Self Care | Payer: Self-pay | Source: Ambulatory Visit | Attending: Cardiovascular Disease | Admitting: Cardiovascular Disease

## 2015-05-05 ENCOUNTER — Other Ambulatory Visit: Payer: Self-pay | Admitting: Cardiovascular Disease

## 2015-05-05 DIAGNOSIS — J449 Chronic obstructive pulmonary disease, unspecified: Secondary | ICD-10-CM | POA: Insufficient documentation

## 2015-05-05 DIAGNOSIS — R0609 Other forms of dyspnea: Secondary | ICD-10-CM | POA: Insufficient documentation

## 2015-05-05 LAB — SPIROMETRY WITH GRAPH
FEF 25-75 Pre: 2.6 L/sec
FEF2575-%Pred-Pre: 73 %
FEV1-%Pred-Pre: 85 %
FEV1-PRE: 3.47 L
FEV1FVC-%Pred-Pre: 95 %
FEV6-%Pred-Pre: 92 %
FEV6-PRE: 4.65 L
FEV6FVC-%Pred-Pre: 102 %
FVC-%PRED-PRE: 89 %
FVC-Pre: 4.67 L
Pre FEV1/FVC ratio: 74 %
Pre FEV6/FVC Ratio: 100 %

## 2015-05-05 LAB — LIPID PANEL
CHOL/HDL RATIO: 5.2 ratio — AB (ref <=5.0)
Cholesterol: 146 mg/dL (ref 125–200)
HDL: 28 mg/dL — ABNORMAL LOW (ref >=40)
LDL Cholesterol: 77 mg/dL (ref <130)
Triglycerides: 204 mg/dL — ABNORMAL HIGH (ref <150)
VLDL: 41 mg/dL — AB (ref <30)

## 2015-05-06 ENCOUNTER — Telehealth: Payer: Self-pay

## 2015-05-06 NOTE — Telephone Encounter (Signed)
-----   Message from Herminio Commons, MD sent at 05/06/2015 11:00 AM EDT ----- Dietary and exercise modification for mildly elevated TG.

## 2015-05-06 NOTE — Telephone Encounter (Signed)
Tried to reach pt at number provided, no answer & no voicemail set up. Will call back later.

## 2015-05-11 ENCOUNTER — Encounter: Payer: Self-pay | Admitting: *Deleted

## 2015-05-11 ENCOUNTER — Telehealth: Payer: Self-pay | Admitting: *Deleted

## 2015-05-11 NOTE — Telephone Encounter (Signed)
-----   Message from Massie Maroon, [place] sent at 05/11/2015  9:23 AM EDT -----   ----- Message -----    From: Herminio Commons, MD    Sent: 05/11/2015   9:11 AM      To: Massie Maroon, CMA  Minimal airflow obstruction.

## 2015-05-11 NOTE — Telephone Encounter (Signed)
Notes Recorded by Laurine Blazer, LPN on 84/69/6295 at 9:38 AM Attempted to notify patient. Voice mail not set up yet. Patient notified of results by letter.  Copy fwd to pmd.    Notes Recorded by Herminio Commons, MD on 05/11/2015 at 9:11 AM Minimal airflow obstruction.

## 2015-05-31 ENCOUNTER — Other Ambulatory Visit: Payer: Self-pay | Admitting: Cardiovascular Disease

## 2015-06-02 ENCOUNTER — Other Ambulatory Visit: Payer: Self-pay | Admitting: Cardiovascular Disease

## 2015-06-03 ENCOUNTER — Encounter (HOSPITAL_COMMUNITY): Payer: Self-pay | Admitting: Psychiatry

## 2015-06-03 ENCOUNTER — Ambulatory Visit (INDEPENDENT_AMBULATORY_CARE_PROVIDER_SITE_OTHER): Payer: Self-pay | Admitting: Psychiatry

## 2015-06-03 VITALS — BP 121/62 | HR 63 | Ht 71.0 in | Wt 214.6 lb

## 2015-06-03 DIAGNOSIS — F17200 Nicotine dependence, unspecified, uncomplicated: Secondary | ICD-10-CM

## 2015-06-03 DIAGNOSIS — F411 Generalized anxiety disorder: Secondary | ICD-10-CM

## 2015-06-03 DIAGNOSIS — F332 Major depressive disorder, recurrent severe without psychotic features: Secondary | ICD-10-CM

## 2015-06-03 DIAGNOSIS — M545 Low back pain: Secondary | ICD-10-CM

## 2015-06-03 DIAGNOSIS — G8929 Other chronic pain: Secondary | ICD-10-CM

## 2015-06-03 DIAGNOSIS — F329 Major depressive disorder, single episode, unspecified: Secondary | ICD-10-CM

## 2015-06-03 DIAGNOSIS — F419 Anxiety disorder, unspecified: Principal | ICD-10-CM

## 2015-06-03 DIAGNOSIS — F4310 Post-traumatic stress disorder, unspecified: Secondary | ICD-10-CM

## 2015-06-03 DIAGNOSIS — F32A Depression, unspecified: Secondary | ICD-10-CM

## 2015-06-03 MED ORDER — ALPRAZOLAM 0.5 MG PO TABS: 0.5000 mg | ORAL_TABLET | Freq: Four times a day (QID) | ORAL | Status: DC

## 2015-06-03 NOTE — Progress Notes (Signed)
Patient ID: Richard [name], male   DOB: 01-Aug-1965, 49 y.o.   MRN: [id number] Patient ID: Richard [name], male   DOB: 12-11-1965, 49 y.o.   MRN: [id number] Patient ID: Richard [name], male   DOB: [birth year], 49 y.o.   MRN: [id number] Patient ID: Richard [name], male   DOB: 10/10/1965, 49 y.o.   MRN: [id number] Patient ID: Richard [name], male   DOB: 1965/07/31, 49 y.o.   MRN: [id number] Patient ID: Richard [name], male   DOB: Apr 22, 1966, 49 y.o.   MRN: [id number] Patient ID: Richard [name], male   DOB: 06-29-1966, 49 y.o.   MRN: [id number] Patient ID: Richard [name], male   DOB: 05/23/1966, 49 y.o.   MRN: [id number] Patient ID: Richard [name], male   DOB: Jul 12, 1966, 49 y.o.   MRN: [id number] Patient ID: Richard [name], male   DOB: Apr 21, 1966, 49 y.o.   MRN: [id number] Patient ID: Richard [name], male   DOB: October 12, 1965, 49 y.o.   MRN: [id number] Patient ID: Richard [name], male   DOB: [birth year], 49 y.o.   MRN: [id number] Patient ID: Richard [name], male   DOB: [birth year], 49 y.o.   MRN: [id number] [facility] [id number] Progress Note Richard [name] LOZOYA MRN: [id number] DOB: 03/08/1966 Age: 49 y.o.  Date: 06/03/2015  Chief Complaint  Patient presents with  . Anxiety  . Follow-up   History of Chief Complaint:   This 49 year old Caucasian male who came for his followup appointment. He is currently living with his wife in [place]. He is unemployed and applying for disability.  The patient states that he has been employed for 5 years. 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 He got injured on the job and both shoulders were torn. He has not been able to work ever since and he feels very badly about this. 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  Since he's not been able to work the patient has been having increasingly depressed and anxious. He feels like his body gives out when he is trying to exert himself even in a minor way. He's been in  the ER recently for chest pain which was ruled out as a panic attack. He also saw cardiologist this week and will be having a stress test. His blood pressure was too low and his lisinopril was cut down. He is also trying to quit smoking by using electronic cigarettes.  The patient returns after 3 months. He states that he is doing somewhat better. His panic attacks of calm down and he is using the Xanax more as prescribed. He doesn't take 4 pills every day however because he doesn't want to get addicted. His mood is fairly good despite significant financial problems. Right now he doesn't have a primary care physician and I gave him names of several in [place] system. Anxiety Presents for follow-up visit. Symptoms include nervous/anxious behavior.     Review of Systems  Constitutional: Positive for fatigue.  Gastrointestinal: Negative.   Musculoskeletal: Positive for back pain.  Skin: Negative.   Psychiatric/Behavioral: Positive for sleep disturbance and dysphoric mood. The patient is nervous/anxious.     Physical Exam Vitals: BP 121/62 mmHg  Pulse 63  Ht 5\' 11"  (1.803 m)  Wt 214 lb 9.6 oz (97.342 kg)  BMI 29.94 kg/m2  SpO2 92%   Past Psychiatric History: Diagnosis: none  Hospitalizations: none  Outpatient Care: none  Substance Abuse Care: none  Self-Mutilation: none  Suicidal Attempts: none  Violent Behaviors: none   Allergies: Allergies  Allergen Reactions  . Dexilant [Dexlansoprazole] Anaphylaxis  . Mushroom Ext Cmplx(Shiitake-Reishi-Mait) Anaphylaxis    Rapid heart rate.  . Penicillins Anaphylaxis   Medical History: Past Medical History  Diagnosis Date  . Chest pain     + palpitations; cath 2005- 30-40% mid LAD, 20% D1, 20% cx, OM, 20-30% RCA, and EF-55%  . COPD (chronic obstructive pulmonary disease) ([place])   . GERD (gastroesophageal reflux disease)   . Hyperlipemia   . Hypertension   . Depression   . Colitis 1990  . Tobacco abuse     1/2 pack per day  .  Diabetes mellitus   . Gastric ulcer 2003; 2012    2003: + esophagitis; negative H.pylori serology  2012: Dr. Oneida Alar, mild gastritis, Bravo PH probe placement, negative H.pylori  . Hepatic steatosis   . Chronic low back pain   . Panic attacks   . Asthma    Surgical History: Past Surgical History  Procedure Laterality Date  . Colonoscopy  1990  . Shoulder surgery      Right acromioclavicular joint arthrosis  . Neck mass excision    . Bravo ph study  05/03/2011    [id number] gastritis/normal esophagus and duodenum  . Cardiac catheterization  2005  . Esophagogastroduodenoscopy  05/03/2011    [id number] gastritis   Family History: family history includes ADD / ADHD in his daughter; Alcohol abuse in his father and mother; Anxiety disorder in his sister and sister; Dementia in his cousin and paternal uncle; Depression in his sister; Diabetes in his brother and father; Heart attack (age of onset: 38) in his brother; Heart attack (age of onset: 24) in his father; Hypertension in his brother, brother, and brother; Lung cancer in his mother; Seizures in his brother. There is no history of Colon cancer, Drug abuse, Bipolar disorder, OCD, Paranoid behavior, Schizophrenia, Sexual abuse, or Physical abuse. Reviewed again today and nothing new.   Current Medications:  Current Outpatient Prescriptions  Medication Sig Dispense Refill  . acetaminophen (TYLENOL) 325 MG tablet Take 650 mg by mouth every 6 (six) hours as needed for pain or fever.     [name] albuterol (VENTOLIN HFA) 108 (90 BASE) MCG/ACT inhaler Inhale 2 puffs into the lungs every 6 (six) hours as needed for wheezing or shortness of breath.     . ALPRAZolam (XANAX) 0.5 MG tablet Take 1 tablet (0.5 mg total) by mouth 4 (four) times daily. 120 tablet 2  . aspirin 81 MG tablet Take 81 mg by mouth at bedtime.     . cyclobenzaprine (FLEXERIL) 10 MG tablet Take 1 tablet (10 mg total) by mouth 3 (three) times daily as needed for muscle spasms. 30 tablet 2   . esomeprazole (NEXIUM) 40 MG capsule Take 1 capsule (40 mg total) by mouth 2 (two) times daily before a meal. 180 capsule 3  . Fluticasone-Salmeterol (ADVAIR) 250-50 MCG/DOSE AEPB Inhale 1 puff into the lungs every 12 (twelve) hours.    [name] HYDROcodone-acetaminophen (NORCO/VICODIN) 5-325 MG per tablet Take 1 tablet by mouth every 6 (six) hours as needed for pain. 90 tablet 1  . ibuprofen (ADVIL,MOTRIN) 600 MG tablet Take 1 tablet (600 mg total) by mouth every 8 (eight) hours as needed for moderate pain. 30 tablet 1  . lisinopril (PRINIVIL,ZESTRIL) 10 MG tablet Take 10 mg by mouth daily.    [name] lovastatin (MEVACOR) 20  MG tablet TAKE ONE TABLET BY MOUTH ONCE DAILY AT 6:OO PM 30 tablet 6  . metFORMIN (GLUCOPHAGE) 500 MG tablet Take 1 tablet (500 mg total) by mouth 2 (two) times daily with a meal. 30 tablet 2  . metoprolol succinate (TOPROL-XL) 50 MG 24 hr tablet TAKE ONE & ONE-HALF TABLETS BY MOUTH ONCE DAILY 45 tablet 11  . nitroGLYCERIN (NITROSTAT) 0.4 MG SL tablet Place 1 tablet (0.4 mg total) under the tongue every 5 (five) minutes as needed for chest pain. 25 tablet 3   No current facility-administered medications for this visit.    Previous Psychotropic Medications: Medication Dose   Xanax     Substance Abuse History in the last 12 months: Substance Age of 1st Use Last Use Amount Specific Type  Nicotine  17  2 hours ago  1  cigarette  Alcohol  22  38      Cannabis  none        Opiates  36  today  2.5 mg  hyrdocodone  Cocaine  none        Methamphetamines  none        LSD  none        Ecstasy  none         Benzodiazepines  22  started in early thrities  0.5 mg  Xanax  Caffeine  childhood  this AM  1 cup  [place]  Inhalants  none        Others:      Sugar  11  this AM  20 tsps  in the [place] Consequences of Substance Abuse: pain Legal Consequences of Substance Abuse: none Family Consequences of Substance Abuse: none Blackouts:  No DT's:  No Withdrawal Symptoms:  Yes  Tremors  Social History: Patient lives with his life, daughter and her husband with 2 grandbabies.  He has 2 sons and the daughter.  He is a Programmer, systems.    Mental Status Examination/Evaluation: Objective:  Appearance: Casual  Eye Contact::  Good  Speech:  Clear and Coherent  Volume:  Normal  Mood:  Fairly good   Affect:  Congruent  Thought Process:  Coherent, Intact and Linear  Orientation:  Full (Time, Place, and Person)  Thought Content:  WDL  Suicidal Thoughts:  No  Homicidal Thoughts:  No  Judgement:  Good  Insight:  Fair  Psychomotor Activity:  Normal  Akathisia:  No  Handed:  Right  AIMS (if indicated):    Assets:  Communication Skills Desire for Improvement   Lab Results:  Results for orders placed or performed in visit on 05/05/15 (from the past 8736 hour(s))  Lipid panel   Collection Time: 05/05/15  8:47 AM  Result Value Ref Range   Cholesterol 146 125 - 200 mg/dL   Triglycerides 204 (H) <150 mg/dL   HDL 28 (L) >=40 mg/dL   Total CHOL/HDL Ratio 5.2 (H) <=5.0 Ratio   VLDL 41 (H) <30 mg/dL   LDL Cholesterol 77 <130 mg/dL  Results for orders placed or performed during the hospital encounter of 05/05/15 (from the past 8736 hour(s))  Spirometry with Graph   Collection Time: 05/05/15  9:40 AM  Result Value Ref Range   FVC-Pre 4.67 L   FVC-%Pred-Pre 89 %   FEV1-Pre 3.47 L   FEV1-%Pred-Pre 85 %   FEV6-Pre 4.65 L   FEV6-%Pred-Pre 92 %   Pre FEV1/FVC ratio 74 %   FEV1FVC-%Pred-Pre 95 %   Pre FEV6/FVC Ratio  100 %   FEV6FVC-%Pred-Pre 102 %   FEF 25-75 Pre 2.60 L/sec   FEF2575-%Pred-Pre 73 %  Results for orders placed or performed in visit on 10/16/14 (from the past 8736 hour(s))  Basic Metabolic Panel (BMET)   Collection Time: 10/19/14  9:07 AM  Result Value Ref Range   Sodium 139 135 - 145 mEq/L   Potassium 4.4 3.5 - 5.3 mEq/L   Chloride 104 96 - 112 mEq/L   CO2 26 19 - 32 mEq/L   Glucose, Bld 159 (H) 70 - 99 mg/dL   BUN 15 6 - 23 mg/dL   Creat  0.93 0.50 - 1.35 mg/dL   Calcium 8.9 8.4 - 10.5 mg/dL  Results for orders placed or performed during the hospital encounter of 09/07/14 (from the past 8736 hour(s))  CBC with Differential/Platelet   Collection Time: 09/07/14 10:50 PM  Result Value Ref Range   WBC 5.8 4.0 - 10.5 K/uL   RBC 4.60 4.22 - 5.81 MIL/uL   Hemoglobin 14.3 13.0 - 17.0 g/dL   HCT 41.7 39.0 - 52.0 %   MCV 90.7 78.0 - 100.0 fL   MCH 31.1 26.0 - 34.0 pg   MCHC 34.3 30.0 - 36.0 g/dL   RDW 12.5 11.5 - 15.5 %   Platelets 184 150 - 400 K/uL   Neutrophils Relative % 48 43 - 77 %   Neutro Abs 2.9 1.7 - 7.7 K/uL   Lymphocytes Relative 41 12 - 46 %   Lymphs Abs 2.4 0.7 - 4.0 K/uL   Monocytes Relative 8 3 - 12 %   Monocytes Absolute 0.5 0.1 - 1.0 K/uL   Eosinophils Relative 3 0 - 5 %   Eosinophils Absolute 0.2 0.0 - 0.7 K/uL   Basophils Relative 0 0 - 1 %   Basophils Absolute 0.0 0.0 - 0.1 K/uL  Basic metabolic panel   Collection Time: 09/07/14 10:50 PM  Result Value Ref Range   Sodium 136 135 - 145 mmol/L   Potassium 3.3 (L) 3.5 - 5.1 mmol/L   Chloride 110 96 - 112 mmol/L   CO2 26 19 - 32 mmol/L   Glucose, Bld 179 (H) 70 - 99 mg/dL   BUN 16 6 - 23 mg/dL   Creatinine, Ser 0.98 0.50 - 1.35 mg/dL   Calcium 8.2 (L) 8.4 - 10.5 mg/dL   GFR calc non Af Amer >90 >90 mL/min   GFR calc Af Amer >90 >90 mL/min   Anion gap 0 (L) 5 - 15  Troponin I   Collection Time: 09/07/14 10:50 PM  Result Value Ref Range   Troponin I <0.03 <0.031 ng/mL  Troponin I   Collection Time: 09/08/14  1:32 AM  Result Value Ref Range   Troponin I <0.03 <0.031 ng/mL    Assessment:   AXIS I Generalized Anxiety Disorder, Major Depression, Recurrent severe and Panic Disorder  AXIS II Deferred  AXIS III Past Medical History  Diagnosis Date  . Chest pain     + palpitations; cath 2005- 30-40% mid LAD, 20% D1, 20% cx, OM, 20-30% RCA, and EF-55%  . COPD (chronic obstructive pulmonary disease) ([place])   . GERD (gastroesophageal reflux disease)    . Hyperlipemia   . Hypertension   . Depression   . Colitis 1990  . Tobacco abuse     1/2 pack per day  . Diabetes mellitus   . Gastric ulcer 2003; 2012    2003: + esophagitis; negative H.pylori serology  2012: Dr. Oneida Alar, mild  gastritis, Bravo PH probe placement, negative H.pylori  . Hepatic steatosis   . Chronic low back pain   . Panic attacks   . Asthma      AXIS IV other psychosocial or environmental problems  AXIS V 41-50 serious symptoms   Treatment Plan/Recommendations: Laboratory:    Psychotherapy: supportive psychotherapy  Medications:  He will continue  Xanax to 0.5 mg 4 times a day for anxiety   Routine PRN Medications:  No  Consultations: none  Safety Concerns:  none  Other:  Return in 3 months     MEDICATIONS this encounter: Meds ordered this encounter  Medications  . lisinopril (PRINIVIL,ZESTRIL) 10 MG tablet    Sig: Take 10 mg by mouth daily.  [name] ALPRAZolam (XANAX) 0.5 MG tablet    Sig: Take 1 tablet (0.5 mg total) by mouth 4 (four) times daily.    Dispense:  120 tablet    Refill:  2   Medical Decision Making Problem Points:  Established problem, stable/improving (1), Review of last therapy session (1) and Review of psycho-social stressors (1) Data Points:  Review or order clinical lab tests (1) Review of medication regiment & side effects (2) Review of new medications or change in dosage (2)  I certify that outpatient services furnished can reasonably be expected to improve the patient's condition.   [name], MD

## 2015-06-06 ENCOUNTER — Other Ambulatory Visit: Payer: Self-pay | Admitting: Cardiovascular Disease

## 2015-06-07 ENCOUNTER — Other Ambulatory Visit: Payer: Self-pay | Admitting: Cardiovascular Disease

## 2015-06-07 MED ORDER — LISINOPRIL 10 MG PO TABS: 10.0000 mg | ORAL_TABLET | Freq: Every day | ORAL | Status: DC

## 2015-06-07 NOTE — Telephone Encounter (Signed)
Refill complete as requested.  ?

## 2015-06-07 NOTE — Telephone Encounter (Signed)
°*  STAT* If patient is at the pharmacy, call can be transferred to refill team.   1. Which medications need to be refilled? (please list name of each medication and dose if known) Lisinopril   2. Which pharmacy/location (including street and city if local pharmacy) is medication to be sent to? [facility]  3. Do they need a 30 day or 90 day supply? 30 day

## 2015-06-08 ENCOUNTER — Other Ambulatory Visit (HOSPITAL_COMMUNITY): Payer: Self-pay | Admitting: Psychiatry

## 2015-07-08 ENCOUNTER — Other Ambulatory Visit: Payer: Self-pay | Admitting: Cardiovascular Disease

## 2015-08-02 ENCOUNTER — Encounter: Payer: Self-pay | Admitting: Gastroenterology

## 2015-08-02 ENCOUNTER — Ambulatory Visit (INDEPENDENT_AMBULATORY_CARE_PROVIDER_SITE_OTHER): Payer: Self-pay | Admitting: Gastroenterology

## 2015-08-02 VITALS — BP 118/73 | HR 61 | Temp 97.3°F | Ht 71.0 in | Wt 208.0 lb

## 2015-08-02 DIAGNOSIS — Z1211 Encounter for screening for malignant neoplasm of colon: Secondary | ICD-10-CM

## 2015-08-02 DIAGNOSIS — R1013 Epigastric pain: Secondary | ICD-10-CM

## 2015-08-02 DIAGNOSIS — K219 Gastro-esophageal reflux disease without esophagitis: Secondary | ICD-10-CM

## 2015-08-02 DIAGNOSIS — R002 Palpitations: Secondary | ICD-10-CM

## 2015-08-02 DIAGNOSIS — K760 Fatty (change of) liver, not elsewhere classified: Secondary | ICD-10-CM

## 2015-08-02 NOTE — Progress Notes (Signed)
cc'ed to pcp °

## 2015-08-02 NOTE — Assessment & Plan Note (Signed)
Continue Nexium 40 mg daily, adjust dosing to 30 minutes before evening meal. Provided samples today. Anti-reflex measures discussed. Call with persistent symptoms.

## 2015-08-02 NOTE — Assessment & Plan Note (Signed)
Doubt correlation with GI/GERD. Encouraged patient to discuss with cardiology tomorrow at his upcoming appointment.

## 2015-08-02 NOTE — Assessment & Plan Note (Signed)
Retrieve most recent labs from PCP.  Instructions for fatty liver: Recommend 1-2# weight loss per week until ideal body weight through exercise & diet. Low fat/cholesterol diet.   Avoid sweets, sodas, fruit juices, sweetened beverages like tea, etc. Gradually increase exercise from 15 min daily up to 1 hr per day 5 days/week. Limit alcohol use.

## 2015-08-02 NOTE — Progress Notes (Signed)
Primary Care Physician: Raiford Simmonds., PA-C  Primary Gastroenterologist:  Barney Drain, MD   Chief Complaint  Patient presents with  . stomach issues  . Palpitations    HPI: Richard [name] is a 50 y.o. male here for further evaluation of stomach issues/palpitations. Last seen in 01/2015. H/o chronic GERD, chronic abdominal pain. EGD 2012, gastritis. States he is allergic to Dexilant, caused throat to swell. At his last office visit in July we scheduled colonoscopy for screening purposes. This was never done. He did not follow through with his barium pill esophagram either.  Presents today complaining of palpitations. States he saw his cardiologist in October and they stopped his lisinopril for a period of time to see if it would help, but it did not. Goes back to see his cardiologist tomorrow. Palpitations began when he lays down at night. States he usually does not have palpitations with his panic attacks. If he lays on his left side he develops palpitations almost immediately. Has noted some when he lays on his right and on his back if there is any pressure between his shoulder blades. Having a difficult time getting any sleep. When the palpitations began he has to stand up or sit up and after consuming "half a bottle of Tums" it may stop. Denies any associated heartburn. Questions the symptoms came on after he reduced his Nexium dose to 20 mg at bedtime due to financial restrictions. 3 weeks ago went back to Nexium 40 mg at bedtime. No noted improvement so far.  Drinks tea and water, no more [facility]. Takes all his medications at night time except for metformin. Has always done it that way. Denies dysphagia, melena, rectal bleeding, constipation, diarrhea.     Current Outpatient Prescriptions  Medication Sig Dispense Refill  . acetaminophen (TYLENOL) 325 MG tablet Take 650 mg by mouth every 6 (six) hours as needed for pain or fever.     [name] albuterol (VENTOLIN HFA) 108 (90 BASE)  MCG/ACT inhaler Inhale 2 puffs into the lungs every 6 (six) hours as needed for wheezing or shortness of breath.     . ALPRAZolam (XANAX) 0.5 MG tablet Take 1 tablet (0.5 mg total) by mouth 4 (four) times daily. 120 tablet 2  . aspirin 81 MG tablet Take 81 mg by mouth at bedtime.     . cyclobenzaprine (FLEXERIL) 10 MG tablet Take 1 tablet (10 mg total) by mouth 3 (three) times daily as needed for muscle spasms. 30 tablet 2  . esomeprazole (NEXIUM) 40 MG capsule Take 1 capsule (40 mg total) by mouth 2 (two) times daily before a meal. (Patient taking differently: Take 40 mg by mouth at bedtime. ) 180 capsule 3  . Fluticasone-Salmeterol (ADVAIR) 250-50 MCG/DOSE AEPB Inhale 1 puff into the lungs every 12 (twelve) hours.    [name] HYDROcodone-acetaminophen (NORCO/VICODIN) 5-325 MG per tablet Take 1 tablet by mouth every 6 (six) hours as needed for pain. 90 tablet 1  . ibuprofen (ADVIL,MOTRIN) 600 MG tablet Take 1 tablet (600 mg total) by mouth every 8 (eight) hours as needed for moderate pain. 30 tablet 1  . lisinopril (PRINIVIL,ZESTRIL) 10 MG tablet Take 1 tablet (10 mg total) by mouth daily. 30 tablet 6  . lovastatin (MEVACOR) 20 MG tablet TAKE ONE TABLET BY MOUTH ONCE DAILY AT 6:OO PM 30 tablet 6  . metFORMIN (GLUCOPHAGE) 500 MG tablet Take 1 tablet (500 mg total) by mouth 2 (two) times daily with a meal. 30 tablet  2  . metoprolol succinate (TOPROL-XL) 50 MG 24 hr tablet TAKE ONE & ONE-HALF TABLETS BY MOUTH ONCE DAILY 45 tablet 3  . nitroGLYCERIN (NITROSTAT) 0.4 MG SL tablet Place 1 tablet (0.4 mg total) under the tongue every 5 (five) minutes as needed for chest pain. 25 tablet 3   No current facility-administered medications for this visit.    Allergies as of 08/02/2015 - Review Complete 08/02/2015  Allergen Reaction Noted  . Dexilant [dexlansoprazole] Anaphylaxis 01/20/2015  . Mushroom ext cmplx(shiitake-reishi-mait) Anaphylaxis 03/22/2011  . Penicillins Anaphylaxis     ROS:  General: Negative  for anorexia, weight loss, fever, chills, fatigue, weakness. ENT: Negative for hoarseness, difficulty swallowing , nasal congestion. CV: Negative for chest pain, angina,  dyspnea on exertion, peripheral edema. +palpitations Respiratory: Negative for dyspnea at rest, dyspnea on exertion, cough, sputum, wheezing.  GI: See history of present illness. GU:  Negative for dysuria, hematuria, urinary incontinence, urinary frequency, nocturnal urination.  Endo: Negative for unusual weight change.    Physical Examination:   BP 118/73 mmHg  Pulse 61  Temp(Src) 97.3 F (36.3 C)  Ht 5\' 11"  (1.803 m)  Wt 208 lb (94.348 kg)  BMI 29.02 kg/m2  General: Well-nourished, well-developed in no acute distress.  Eyes: No icterus. Mouth: Oropharyngeal mucosa moist and pink , no lesions erythema or exudate. Lungs: Clear to auscultation bilaterally.  Heart: Regular rate and rhythm, no murmurs rubs or gallops.  Abdomen: Bowel sounds are normal, mild epigastric tenderness, nondistended, no hepatosplenomegaly or masses, no abdominal bruits or hernia , no rebound or guarding.   Extremities: No lower extremity edema. No clubbing or deformities. Neuro: Alert and oriented x 4   Skin: Warm and dry, no jaundice.   Psych: Alert and cooperative, normal mood and affect.  Labs:  Lab Results  Component Value Date   CREATININE 0.93 10/19/2014   BUN 15 10/19/2014   NA 139 10/19/2014   K 4.4 10/19/2014   CL 104 10/19/2014   CO2 26 10/19/2014   Requested labs from health department Imaging Studies: No results found.

## 2015-08-02 NOTE — Patient Instructions (Signed)
1. Take Nexium 30 minutes before your evening meal.  2. Discuss your palpitations with cardiology tomorrow. I doubt they are linked to your GERD.  3. Call if you are no better and cardiology does not suspect cardiac in origin. May consider imipramine at that time.  4. You also have history of fatty liver and should have your liver labs check every six months. We will request copy from health department for review and to make sure you are up to date.   Instructions for fatty liver: Recommend 1-2# weight loss per week until ideal body weight through exercise & diet. Low fat/cholesterol diet.   Avoid sweets, sodas, fruit juices, sweetened beverages like tea, etc. Gradually increase exercise from 15 min daily up to 1 hr per day 5 days/week. Limit alcohol use.

## 2015-08-02 NOTE — Assessment & Plan Note (Signed)
Due for colonoscopy. Patient did not follow through last year. Will discuss further via telephone within the next couple of weeks.

## 2015-08-03 ENCOUNTER — Ambulatory Visit: Payer: Self-pay | Admitting: Cardiovascular Disease

## 2015-08-03 ENCOUNTER — Other Ambulatory Visit: Payer: Self-pay | Admitting: Cardiovascular Disease

## 2015-08-03 ENCOUNTER — Encounter: Payer: Self-pay | Admitting: Cardiovascular Disease

## 2015-08-03 ENCOUNTER — Ambulatory Visit (INDEPENDENT_AMBULATORY_CARE_PROVIDER_SITE_OTHER): Payer: Self-pay | Admitting: Cardiovascular Disease

## 2015-08-03 ENCOUNTER — Encounter: Payer: Self-pay | Admitting: *Deleted

## 2015-08-03 VITALS — BP 114/75 | HR 57 | Ht 71.0 in | Wt 210.0 lb

## 2015-08-03 DIAGNOSIS — E876 Hypokalemia: Secondary | ICD-10-CM

## 2015-08-03 DIAGNOSIS — R0609 Other forms of dyspnea: Secondary | ICD-10-CM

## 2015-08-03 DIAGNOSIS — F17200 Nicotine dependence, unspecified, uncomplicated: Secondary | ICD-10-CM

## 2015-08-03 DIAGNOSIS — E785 Hyperlipidemia, unspecified: Secondary | ICD-10-CM

## 2015-08-03 DIAGNOSIS — R079 Chest pain, unspecified: Secondary | ICD-10-CM

## 2015-08-03 DIAGNOSIS — I25118 Atherosclerotic heart disease of native coronary artery with other forms of angina pectoris: Secondary | ICD-10-CM

## 2015-08-03 DIAGNOSIS — R002 Palpitations: Secondary | ICD-10-CM

## 2015-08-03 NOTE — Patient Instructions (Signed)
   Lab today for BMET - order given. Continue all current medications. Your physician has requested that you have a cardiac catheterization. Cardiac catheterization is used to diagnose and/or treat various heart conditions. Doctors may recommend this procedure for a number of different reasons. The most common reason is to evaluate chest pain. Chest pain can be a symptom of coronary artery disease (CAD), and cardiac catheterization can show whether plaque is narrowing or blocking your heart's arteries. This procedure is also used to evaluate the valves, as well as measure the blood flow and oxygen levels in different parts of your heart. For further information please visit [email or web address]. Please follow instruction sheet, as given. Follow up will be given at time of discharge from above.

## 2015-08-03 NOTE — Progress Notes (Signed)
Patient ID: Richard [name], male   DOB: 04-14-1966, 50 y.o.   MRN: [id number]      SUBJECTIVE: The patient presents for follow-up after undergoing spirometry for progressive exertional dyspnea, which demonstrated minimal airway obstruction.  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  He underwent a low risk Lexiscan Cardiolite stress test in September 2014, with no evidence of myocardial ischemia or scar and normal left ventricular systolic function. He follows with a psychiatrist.  Has palpitations at night. Continues to have exertional chest pain and dyspnea and is unable to perform activities he was able to do one or two years ago.  Review of Systems: As per "subjective", otherwise negative.  Allergies  Allergen Reactions  . Dexilant [Dexlansoprazole] Anaphylaxis  . Mushroom Ext Cmplx(Shiitake-Reishi-Mait) Anaphylaxis    Rapid heart rate.  . Penicillins Anaphylaxis    Current Outpatient Prescriptions  Medication Sig Dispense Refill  . acetaminophen (TYLENOL) 325 MG tablet Take 650 mg by mouth every 6 (six) hours as needed for pain or fever.     [name] albuterol (VENTOLIN HFA) 108 (90 BASE) MCG/ACT inhaler Inhale 2 puffs into the lungs every 6 (six) hours as needed for wheezing or shortness of breath.     . ALPRAZolam (XANAX) 0.5 MG tablet Take 1 tablet (0.5 mg total) by mouth 4 (four) times daily. 120 tablet 2  . aspirin 81 MG tablet Take 81 mg by mouth at bedtime.     . cyclobenzaprine (FLEXERIL) 10 MG tablet Take 1 tablet (10 mg total) by mouth 3 (three) times daily as needed for muscle spasms. 30 tablet 2  . esomeprazole (NEXIUM) 40 MG capsule Take 1 capsule (40 mg total) by mouth 2 (two) times daily before a meal. (Patient taking differently:  Take 40 mg by mouth at bedtime. ) 180 capsule 3  . Fluticasone-Salmeterol (ADVAIR) 250-50 MCG/DOSE AEPB Inhale 1 puff into the lungs every 12 (twelve) hours.    [name] HYDROcodone-acetaminophen (NORCO/VICODIN) 5-325 MG per tablet Take 1 tablet by mouth every 6 (six) hours as needed for pain. 90 tablet 1  . ibuprofen (ADVIL,MOTRIN) 600 MG tablet Take 1 tablet (600 mg total) by mouth every 8 (eight) hours as needed for moderate pain. 30 tablet 1  . lisinopril (PRINIVIL,ZESTRIL) 10 MG tablet Take 1 tablet (10 mg total) by mouth daily. 30 tablet 6  . lovastatin (MEVACOR) 20 MG tablet TAKE ONE TABLET BY MOUTH ONCE DAILY AT 6:OO PM 30 tablet 6  . metFORMIN (GLUCOPHAGE) 500 MG tablet Take 1 tablet (500 mg total) by mouth 2 (two) times daily with a meal. 30 tablet 2  . metoprolol succinate (TOPROL-XL) 50 MG 24 hr tablet TAKE ONE & ONE-HALF TABLETS BY MOUTH ONCE DAILY 45 tablet 3  . nitroGLYCERIN (NITROSTAT) 0.4 MG SL tablet Place 1 tablet (0.4 mg total) under the tongue every 5 (five) minutes as needed for chest pain. 25 tablet 3   No current facility-administered medications for this visit.    Past Medical History  Diagnosis Date  . Chest pain     + palpitations; cath 2005- 30-40% mid LAD, 20% D1, 20% cx, OM, 20-30% RCA, and EF-55%  . COPD (chronic obstructive pulmonary disease) ([place])   . GERD (gastroesophageal reflux disease)   . Hyperlipemia   . Hypertension   .  Depression   . Colitis 1990  . Tobacco abuse     1/2 pack per day  . Diabetes mellitus   . Gastric ulcer 2003; 2012    2003: + esophagitis; negative H.pylori serology  2012: Dr. Oneida Alar, mild gastritis, Bravo PH probe placement, negative H.pylori  . Hepatic steatosis   . Chronic low back pain   . Panic attacks   . Asthma     Past Surgical History  Procedure Laterality Date  . Colonoscopy  1990  . Shoulder surgery      Right acromioclavicular joint arthrosis  . Neck mass excision    . Bravo ph study  05/03/2011    [id number]  gastritis/normal esophagus and duodenum  . Cardiac catheterization  2005  . Esophagogastroduodenoscopy  05/03/2011    [id number] gastritis    Social History   Social History  . Marital Status: Married    Spouse Name: N/A  . Number of Children: N/A  . Years of Education: N/A   Occupational History  . full time    Social History Main Topics  . Smoking status: Current Every Day Smoker -- 0.50 packs/day for 29 years    Types: Cigarettes    Start date: 07/17/1982  . Smokeless tobacco: Never Used     Comment: on the verge of quitting as of 01-03-13  . Alcohol Use: No  . Drug Use: No  . Sexual Activity: Yes   Other Topics Concern  . Not on file   Social History Narrative     Filed Vitals:   08/03/15 1100  BP: 114/75  Pulse: 57  Height: 5\' 11"  (1.803 m)  Weight: 210 lb (95.255 kg)    PHYSICAL EXAM General: NAD HEENT: Normal. Neck: No JVD, no thyromegaly. Lungs: Clear to auscultation bilaterally with normal respiratory effort. CV: Nondisplaced PMI. Regular rate and rhythm, normal S1/S2, no S3/S4, no murmur. No pretibial or periankle edema. No carotid bruit.  Abdomen: Firm, obese.  Neurologic: Alert and oriented x 3.  Psych: Normal affect. Skin: Normal. Musculoskeletal: No gross deformities. Extremities: No clubbing or cyanosis.   ECG: Most recent ECG reviewed.      ASSESSMENT AND PLAN: 1. Chest pain and SOB in context of CAD: Normal Lexiscan Cardiolite stress test in 03/2013. Spirometry unremarkable in 2016. No changes to therapy which includes ASA, metoprolol, and statin. Given lifestyle limiting symptoms, will proceed with coronary angiography.  2. Essential HTN: Controlled on current therapy. No changes.  3. Palpitations: Takes metoprolol. No indication for cardiac Holter or event monitoring. Check BMET due to h/o hypokalemia.  4. Hyperlipidemia: TG 204, HDL 28, LDL 77. On lovastatin.   Dispo: f/u after cath  Kate Sable, M.D., F.A.C.C.

## 2015-08-04 ENCOUNTER — Telehealth: Payer: Self-pay | Admitting: *Deleted

## 2015-08-04 LAB — BASIC METABOLIC PANEL
BUN: 15 mg/dL (ref 7–25)
CO2: 26 mmol/L (ref 20–31)
Calcium: 9.5 mg/dL (ref 8.6–10.3)
Chloride: 99 mmol/L (ref 98–110)
Creat: 0.85 mg/dL (ref 0.70–1.33)
Glucose, Bld: 179 mg/dL — ABNORMAL HIGH (ref 65–99)
POTASSIUM: 4.6 mmol/L (ref 3.5–5.3)
SODIUM: 132 mmol/L — AB (ref 135–146)

## 2015-08-04 NOTE — Telephone Encounter (Signed)
-----   Message from Herminio Commons, MD sent at 08/04/2015  9:09 AM EST ----- Very mildly low sodium. Routing monitoring.

## 2015-08-04 NOTE — Telephone Encounter (Signed)
Notes Recorded by Laurine Blazer, LPN on [id number] at [id number] PM Patient notified. Copy fwd to pmd.

## 2015-08-06 ENCOUNTER — Telehealth: Payer: Self-pay | Admitting: Cardiovascular Disease

## 2015-08-06 NOTE — Telephone Encounter (Signed)
Richard [name] wants to schedule his catherization  Thursday, January 26,2017   Please call [phone]

## 2015-08-09 ENCOUNTER — Other Ambulatory Visit: Payer: Self-pay | Admitting: Cardiovascular Disease

## 2015-08-09 ENCOUNTER — Telehealth: Payer: Self-pay | Admitting: Gastroenterology

## 2015-08-09 DIAGNOSIS — R079 Chest pain, unspecified: Secondary | ICD-10-CM

## 2015-08-09 NOTE — Telephone Encounter (Signed)
Left heart cath scheduled for Thursday, 08/12/15 at 7:30 am with Dr. Tamala Julian.  Patient to arrive at 5:30.    Attempted to notify.  Voice mail box not set up yet.

## 2015-08-09 NOTE — Telephone Encounter (Signed)
Pt seen [facility] on 08/02/15 and provider wanted his LFTs from health department. I sent a release of records and they said that they haven't seen him in their office. I called the wife on Friday to see where he had his labs done and she said they all he had done was his A1C and a u/a.  She asked about him having a ultrasound to check his gallbladder and he was still having problems when he eats (fried chicken).  I told her that I would let the nurse be aware and see what they recommend and if they need to order any labs on patient. Please advise

## 2015-08-09 NOTE — Telephone Encounter (Signed)
I called pt and he said he was last seen at the [facility] in December 2016. He is still having the palpitations at night and it feels like his heart is about ready to jump out. He wants to know what he needs to do and if he needs more labs or tests. He is aware that Neil Crouch, PA had left for the day. He is not having the problem right now, said it has been going on for 2 years. He is aware that he should go to the ED if he starts having bad palpitations.

## 2015-08-10 ENCOUNTER — Ambulatory Visit: Payer: Self-pay | Admitting: Cardiovascular Disease

## 2015-08-10 ENCOUNTER — Telehealth: Payer: Self-pay | Admitting: Cardiovascular Disease

## 2015-08-10 NOTE — Telephone Encounter (Signed)
Call placed to wife [name]) for notification of cath time below.  Instructions reviewed again with reminder to hold his Metformin 24 hours prior & 48 hours after cath.  She verbalized understanding.

## 2015-08-10 NOTE — Telephone Encounter (Signed)
Needs LFTs. Dx: fatty liver. If he is having upper abd pain with meals, then yes we can order abd u/s. He is also due for colonoscopy, never had it done last year. I would wait until after he recovers from heart cath to schedule.

## 2015-08-10 NOTE — Telephone Encounter (Signed)
   Thursday, 1/26 at 7:30 - Dr. Tamala Julian.  - Checking percert

## 2015-08-11 ENCOUNTER — Other Ambulatory Visit: Payer: Self-pay

## 2015-08-11 DIAGNOSIS — K760 Fatty (change of) liver, not elsewhere classified: Secondary | ICD-10-CM

## 2015-08-11 NOTE — Telephone Encounter (Signed)
Self pay.  No precert required

## 2015-08-11 NOTE — Telephone Encounter (Signed)
Tried to call pt. VM not set up. Lab order has been faxed to [facility].

## 2015-08-12 ENCOUNTER — Observation Stay (HOSPITAL_COMMUNITY): Payer: Self-pay

## 2015-08-12 ENCOUNTER — Observation Stay (HOSPITAL_COMMUNITY)
Admission: RE | Admit: 2015-08-12 | Discharge: 2015-08-13 | Disposition: A | Discharge status: Home / Self Care | Payer: Self-pay | Source: Ambulatory Visit | Attending: Interventional Cardiology | Admitting: Interventional Cardiology

## 2015-08-12 ENCOUNTER — Encounter (HOSPITAL_COMMUNITY): Payer: Self-pay | Admitting: General Practice

## 2015-08-12 ENCOUNTER — Ambulatory Visit (HOSPITAL_COMMUNITY): Payer: Self-pay

## 2015-08-12 ENCOUNTER — Observation Stay (HOSPITAL_BASED_OUTPATIENT_CLINIC_OR_DEPARTMENT_OTHER): Payer: Self-pay

## 2015-08-12 ENCOUNTER — Encounter (HOSPITAL_COMMUNITY)
Admission: RE | Disposition: A | Discharge status: Home / Self Care | Payer: Self-pay | Source: Ambulatory Visit | Attending: Interventional Cardiology

## 2015-08-12 DIAGNOSIS — Z7982 Long term (current) use of aspirin: Secondary | ICD-10-CM | POA: Insufficient documentation

## 2015-08-12 DIAGNOSIS — F419 Anxiety disorder, unspecified: Secondary | ICD-10-CM | POA: Insufficient documentation

## 2015-08-12 DIAGNOSIS — F17210 Nicotine dependence, cigarettes, uncomplicated: Secondary | ICD-10-CM | POA: Diagnosis present

## 2015-08-12 DIAGNOSIS — I10 Essential (primary) hypertension: Secondary | ICD-10-CM | POA: Diagnosis present

## 2015-08-12 DIAGNOSIS — E782 Mixed hyperlipidemia: Secondary | ICD-10-CM | POA: Diagnosis present

## 2015-08-12 DIAGNOSIS — G453 Amaurosis fugax: Secondary | ICD-10-CM

## 2015-08-12 DIAGNOSIS — J449 Chronic obstructive pulmonary disease, unspecified: Secondary | ICD-10-CM | POA: Insufficient documentation

## 2015-08-12 DIAGNOSIS — Z79899 Other long term (current) drug therapy: Secondary | ICD-10-CM | POA: Insufficient documentation

## 2015-08-12 DIAGNOSIS — H5347 Heteronymous bilateral field defects: Secondary | ICD-10-CM

## 2015-08-12 DIAGNOSIS — I2510 Atherosclerotic heart disease of native coronary artery without angina pectoris: Secondary | ICD-10-CM | POA: Insufficient documentation

## 2015-08-12 DIAGNOSIS — G459 Transient cerebral ischemic attack, unspecified: Secondary | ICD-10-CM | POA: Diagnosis present

## 2015-08-12 DIAGNOSIS — F329 Major depressive disorder, single episode, unspecified: Secondary | ICD-10-CM | POA: Insufficient documentation

## 2015-08-12 DIAGNOSIS — M47816 Spondylosis without myelopathy or radiculopathy, lumbar region: Secondary | ICD-10-CM | POA: Insufficient documentation

## 2015-08-12 DIAGNOSIS — G450 Vertebro-basilar artery syndrome: Secondary | ICD-10-CM

## 2015-08-12 DIAGNOSIS — J45909 Unspecified asthma, uncomplicated: Secondary | ICD-10-CM | POA: Insufficient documentation

## 2015-08-12 DIAGNOSIS — K219 Gastro-esophageal reflux disease without esophagitis: Secondary | ICD-10-CM | POA: Insufficient documentation

## 2015-08-12 DIAGNOSIS — H53462 Homonymous bilateral field defects, left side: Secondary | ICD-10-CM

## 2015-08-12 DIAGNOSIS — E785 Hyperlipidemia, unspecified: Secondary | ICD-10-CM | POA: Insufficient documentation

## 2015-08-12 DIAGNOSIS — G8929 Other chronic pain: Secondary | ICD-10-CM | POA: Insufficient documentation

## 2015-08-12 DIAGNOSIS — Z7984 Long term (current) use of oral hypoglycemic drugs: Secondary | ICD-10-CM | POA: Insufficient documentation

## 2015-08-12 DIAGNOSIS — E119 Type 2 diabetes mellitus without complications: Secondary | ICD-10-CM | POA: Insufficient documentation

## 2015-08-12 DIAGNOSIS — F17200 Nicotine dependence, unspecified, uncomplicated: Secondary | ICD-10-CM

## 2015-08-12 DIAGNOSIS — I2583 Coronary atherosclerosis due to lipid rich plaque: Secondary | ICD-10-CM

## 2015-08-12 DIAGNOSIS — R002 Palpitations: Secondary | ICD-10-CM

## 2015-08-12 DIAGNOSIS — R079 Chest pain, unspecified: Principal | ICD-10-CM | POA: Insufficient documentation

## 2015-08-12 DIAGNOSIS — R2990 Unspecified symptoms and signs involving the nervous system: Secondary | ICD-10-CM

## 2015-08-12 HISTORY — DX: Type 2 diabetes mellitus without complications: E11.9

## 2015-08-12 HISTORY — DX: Personal history of other diseases of the digestive system: Z87.19

## 2015-08-12 HISTORY — PX: CARDIAC CATHETERIZATION: SHX172

## 2015-08-12 HISTORY — DX: Unspecified osteoarthritis, unspecified site: M19.90

## 2015-08-12 LAB — BASIC METABOLIC PANEL
Anion gap: 12 (ref 5–15)
BUN: 12 mg/dL (ref 6–20)
CALCIUM: 9.2 mg/dL (ref 8.9–10.3)
CO2: 25 mmol/L (ref 22–32)
CREATININE: 0.89 mg/dL (ref 0.61–1.24)
Chloride: 101 mmol/L (ref 101–111)
GFR calc non Af Amer: >60 mL/min (ref >60)
Glucose, Bld: 137 mg/dL — ABNORMAL HIGH (ref 65–99)
Potassium: 4.2 mmol/L (ref 3.5–5.1)
Sodium: 138 mmol/L (ref 135–145)

## 2015-08-12 LAB — PROTIME-INR
INR: 0.97 (ref 0.00–1.49)
PROTHROMBIN TIME: 13.1 s (ref 11.6–15.2)

## 2015-08-12 LAB — CBC
HCT: 45 % (ref 39.0–52.0)
Hemoglobin: 15.7 g/dL (ref 13.0–17.0)
MCH: 31.5 pg (ref 26.0–34.0)
MCHC: 34.9 g/dL (ref 30.0–36.0)
MCV: 90.2 fL (ref 78.0–100.0)
PLATELETS: 232 10*3/uL (ref 150–400)
RBC: 4.99 MIL/uL (ref 4.22–5.81)
RDW: 12.3 % (ref 11.5–15.5)
WBC: 6.6 10*3/uL (ref 4.0–10.5)

## 2015-08-12 LAB — GLUCOSE, CAPILLARY
GLUCOSE-CAPILLARY: 124 mg/dL — AB (ref 65–99)
GLUCOSE-CAPILLARY: 155 mg/dL — AB (ref 65–99)
GLUCOSE-CAPILLARY: 117 mg/dL — AB (ref 65–99)
GLUCOSE-CAPILLARY: 129 mg/dL — AB (ref 65–99)
Glucose-Capillary: 128 mg/dL — ABNORMAL HIGH (ref 65–99)

## 2015-08-12 LAB — APTT: aPTT: 30 seconds (ref 24–37)

## 2015-08-12 SURGERY — LEFT HEART CATH AND CORONARY ANGIOGRAPHY

## 2015-08-12 MED ORDER — HYDROCODONE-ACETAMINOPHEN 5-325 MG PO TABS: 1.0000 | ORAL_TABLET | Freq: Four times a day (QID) | ORAL | Status: DC | PRN

## 2015-08-12 MED ORDER — NITROGLYCERIN 1 MG/10 ML FOR IR/CATH LAB
INTRA_ARTERIAL | Status: DC | PRN
  Administered 2015-08-12: 200 ug via INTRACORONARY

## 2015-08-12 MED ORDER — ASPIRIN 81 MG PO CHEW
81.0000 mg | CHEWABLE_TABLET | ORAL | Status: AC
  Administered 2015-08-12: 81 mg via ORAL

## 2015-08-12 MED ORDER — ONDANSETRON HCL 4 MG/2ML IJ SOLN: 4.0000 mg | Freq: Four times a day (QID) | INTRAMUSCULAR | Status: DC | PRN

## 2015-08-12 MED ORDER — HEPARIN SODIUM (PORCINE) 1000 UNIT/ML IJ SOLN
INTRAMUSCULAR | Status: DC | PRN
  Administered 2015-08-12: 4500 [IU] via INTRAVENOUS

## 2015-08-12 MED ORDER — SODIUM CHLORIDE 0.9 % IV SOLN
INTRAVENOUS | Status: DC
  Administered 2015-08-12: 11:00:00 via INTRAVENOUS

## 2015-08-12 MED ORDER — SODIUM CHLORIDE 0.9 % IJ SOLN: 3.0000 mL | INTRAMUSCULAR | Status: DC | PRN

## 2015-08-12 MED ORDER — PRAVASTATIN SODIUM 20 MG PO TABS
20.0000 mg | ORAL_TABLET | Freq: Every day | ORAL | Status: DC
  Administered 2015-08-12: 18:00:00 20 mg via ORAL
  Filled 2015-08-12: qty 1

## 2015-08-12 MED ORDER — SODIUM CHLORIDE 0.9 % WEIGHT BASED INFUSION
3.0000 mL/kg/h | INTRAVENOUS | Status: DC
  Administered 2015-08-12: 3 mL/kg/h via INTRAVENOUS

## 2015-08-12 MED ORDER — ALPRAZOLAM 0.25 MG PO TABS: 0.2500 mg | ORAL_TABLET | Freq: Two times a day (BID) | ORAL | Status: DC | PRN

## 2015-08-12 MED ORDER — FENTANYL CITRATE (PF) 100 MCG/2ML IJ SOLN
INTRAMUSCULAR | Status: AC
  Filled 2015-08-12: qty 2

## 2015-08-12 MED ORDER — LISINOPRIL 10 MG PO TABS: 10.0000 mg | ORAL_TABLET | Freq: Every day | ORAL | Status: DC

## 2015-08-12 MED ORDER — INSULIN ASPART 100 UNIT/ML ~~LOC~~ SOLN
0.0000 [IU] | Freq: Three times a day (TID) | SUBCUTANEOUS | Status: DC
  Administered 2015-08-12 – 2015-08-13 (×2): 3 [IU] via SUBCUTANEOUS

## 2015-08-12 MED ORDER — SODIUM CHLORIDE 0.9 % IV SOLN: 250.0000 mL | INTRAVENOUS | Status: DC | PRN

## 2015-08-12 MED ORDER — PANTOPRAZOLE SODIUM 40 MG PO TBEC
80.0000 mg | DELAYED_RELEASE_TABLET | Freq: Every day | ORAL | Status: DC
  Administered 2015-08-12: 80 mg via ORAL
  Filled 2015-08-12: qty 2

## 2015-08-12 MED ORDER — ASPIRIN 325 MG PO TABS
325.0000 mg | ORAL_TABLET | Freq: Every day | ORAL | Status: DC
  Administered 2015-08-13: 325 mg via ORAL
  Filled 2015-08-12: qty 1

## 2015-08-12 MED ORDER — METOPROLOL SUCCINATE ER 25 MG PO TB24: 25.0000 mg | ORAL_TABLET | Freq: Every day | ORAL | Status: DC

## 2015-08-12 MED ORDER — ACETAMINOPHEN 325 MG PO TABS: 650.0000 mg | ORAL_TABLET | ORAL | Status: DC | PRN

## 2015-08-12 MED ORDER — OXYCODONE-ACETAMINOPHEN 5-325 MG PO TABS: 1.0000 | ORAL_TABLET | ORAL | Status: DC | PRN

## 2015-08-12 MED ORDER — SODIUM CHLORIDE 0.9 % WEIGHT BASED INFUSION: 1.0000 mL/kg/h | INTRAVENOUS | Status: AC

## 2015-08-12 MED ORDER — INSULIN ASPART 100 UNIT/ML ~~LOC~~ SOLN: 0.0000 [IU] | Freq: Every day | SUBCUTANEOUS | Status: DC

## 2015-08-12 MED ORDER — HEPARIN (PORCINE) IN NACL 2-0.9 UNIT/ML-% IJ SOLN
INTRAMUSCULAR | Status: AC
  Filled 2015-08-12: qty 1000

## 2015-08-12 MED ORDER — SODIUM CHLORIDE 0.9% FLUSH: 3.0000 mL | INTRAVENOUS | Status: DC | PRN

## 2015-08-12 MED ORDER — ASPIRIN 81 MG PO CHEW
CHEWABLE_TABLET | ORAL | Status: AC
  Administered 2015-08-12: 81 mg via ORAL
  Filled 2015-08-12: qty 1

## 2015-08-12 MED ORDER — VERAPAMIL HCL 2.5 MG/ML IV SOLN
INTRAVENOUS | Status: AC
  Filled 2015-08-12: qty 2

## 2015-08-12 MED ORDER — STROKE: EARLY STAGES OF RECOVERY BOOK
Freq: Once | Status: AC
  Administered 2015-08-12: 20:00:00
  Filled 2015-08-12: qty 1

## 2015-08-12 MED ORDER — ASPIRIN 300 MG RE SUPP
300.0000 mg | Freq: Every day | RECTAL | Status: DC
Start: 2015-08-12 — End: 2015-08-13
  Filled 2015-08-12: qty 1

## 2015-08-12 MED ORDER — LORAZEPAM 2 MG/ML IJ SOLN
0.5000 mg | Freq: Once | INTRAMUSCULAR | Status: AC
  Administered 2015-08-12: 0.5 mg via INTRAVENOUS
  Filled 2015-08-12: qty 1

## 2015-08-12 MED ORDER — MOMETASONE FURO-FORMOTEROL FUM 100-5 MCG/ACT IN AERO
2.0000 | INHALATION_SPRAY | Freq: Two times a day (BID) | RESPIRATORY_TRACT | Status: DC
  Administered 2015-08-12 – 2015-08-13 (×2): 2 via RESPIRATORY_TRACT
  Filled 2015-08-12: qty 8.8

## 2015-08-12 MED ORDER — SODIUM CHLORIDE 0.9 % IJ SOLN: 3.0000 mL | Freq: Two times a day (BID) | INTRAMUSCULAR | Status: DC

## 2015-08-12 MED ORDER — ALBUTEROL SULFATE (2.5 MG/3ML) 0.083% IN NEBU: 3.0000 mL | INHALATION_SOLUTION | Freq: Four times a day (QID) | RESPIRATORY_TRACT | Status: DC | PRN

## 2015-08-12 MED ORDER — MIDAZOLAM HCL 2 MG/2ML IJ SOLN
INTRAMUSCULAR | Status: DC | PRN
  Administered 2015-08-12 (×2): 1 mg via INTRAVENOUS

## 2015-08-12 MED ORDER — HEPARIN (PORCINE) IN NACL 2-0.9 UNIT/ML-% IJ SOLN
INTRAMUSCULAR | Status: DC | PRN
  Administered 2015-08-12: 08:00:00 via INTRA_ARTERIAL

## 2015-08-12 MED ORDER — SENNOSIDES-DOCUSATE SODIUM 8.6-50 MG PO TABS: 1.0000 | ORAL_TABLET | Freq: Every evening | ORAL | Status: DC | PRN

## 2015-08-12 MED ORDER — METOPROLOL SUCCINATE ER 25 MG PO TB24: 75.0000 mg | ORAL_TABLET | Freq: Every day | ORAL | Status: DC

## 2015-08-12 MED ORDER — ALPRAZOLAM 0.5 MG PO TABS
0.5000 mg | ORAL_TABLET | Freq: Every day | ORAL | Status: DC
  Administered 2015-08-12: 0.5 mg via ORAL
  Filled 2015-08-12: qty 1

## 2015-08-12 MED ORDER — CYCLOBENZAPRINE HCL 10 MG PO TABS: 10.0000 mg | ORAL_TABLET | Freq: Three times a day (TID) | ORAL | Status: DC | PRN

## 2015-08-12 MED ORDER — LIDOCAINE HCL (PF) 1 % IJ SOLN
INTRAMUSCULAR | Status: DC | PRN
  Administered 2015-08-12: 08:00:00

## 2015-08-12 MED ORDER — ASPIRIN 81 MG PO TABS: 81.0000 mg | ORAL_TABLET | Freq: Every day | ORAL | Status: DC

## 2015-08-12 MED ORDER — IOHEXOL 350 MG/ML SOLN
INTRAVENOUS | Status: DC | PRN
  Administered 2015-08-12: 85 mL via INTRA_ARTERIAL

## 2015-08-12 MED ORDER — SODIUM CHLORIDE 0.9% FLUSH: 3.0000 mL | Freq: Two times a day (BID) | INTRAVENOUS | Status: DC

## 2015-08-12 MED ORDER — ENOXAPARIN SODIUM 40 MG/0.4ML ~~LOC~~ SOLN: 40.0000 mg | SUBCUTANEOUS | Status: DC

## 2015-08-12 MED ORDER — ASPIRIN EC 325 MG PO TBEC
DELAYED_RELEASE_TABLET | ORAL | Status: AC
  Administered 2015-08-12: 325 mg
  Filled 2015-08-12: qty 1

## 2015-08-12 MED ORDER — LIDOCAINE HCL (PF) 1 % IJ SOLN
INTRAMUSCULAR | Status: AC
  Filled 2015-08-12: qty 30

## 2015-08-12 MED ORDER — SODIUM CHLORIDE 0.9 % WEIGHT BASED INFUSION
1.0000 mL/kg/h | INTRAVENOUS | Status: DC
  Administered 2015-08-12: 1 mL/kg/h via INTRAVENOUS

## 2015-08-12 MED ORDER — ZOLPIDEM TARTRATE 5 MG PO TABS: 5.0000 mg | ORAL_TABLET | Freq: Every evening | ORAL | Status: DC | PRN

## 2015-08-12 MED ORDER — STROKE: EARLY STAGES OF RECOVERY BOOK
Freq: Once | Status: AC
  Filled 2015-08-12: qty 1

## 2015-08-12 MED ORDER — HEPARIN SODIUM (PORCINE) 1000 UNIT/ML IJ SOLN
INTRAMUSCULAR | Status: AC
  Filled 2015-08-12: qty 1

## 2015-08-12 MED ORDER — FENTANYL CITRATE (PF) 100 MCG/2ML IJ SOLN
INTRAMUSCULAR | Status: DC | PRN
  Administered 2015-08-12: 25 ug via INTRAVENOUS
  Administered 2015-08-12: 50 ug via INTRAVENOUS

## 2015-08-12 MED ORDER — NITROGLYCERIN 1 MG/10 ML FOR IR/CATH LAB
INTRA_ARTERIAL | Status: AC
  Filled 2015-08-12: qty 10

## 2015-08-12 MED ORDER — MIDAZOLAM HCL 2 MG/2ML IJ SOLN
INTRAMUSCULAR | Status: AC
  Filled 2015-08-12: qty 2

## 2015-08-12 MED ORDER — HEPARIN SODIUM (PORCINE) 5000 UNIT/ML IJ SOLN
5000.0000 [IU] | Freq: Three times a day (TID) | INTRAMUSCULAR | Status: DC
  Administered 2015-08-12 – 2015-08-13 (×4): 5000 [IU] via SUBCUTANEOUS
  Filled 2015-08-12 (×4): qty 1

## 2015-08-12 SURGICAL SUPPLY — 9 items
CATH INFINITI 5 FR JL3.5 (CATHETERS) ×3 IMPLANT
CATH INFINITI JR4 5F (CATHETERS) ×3 IMPLANT
DEVICE RAD COMP TR BAND LRG (VASCULAR PRODUCTS) ×3 IMPLANT
GLIDESHEATH SLEND A-KIT 6F 22G (SHEATH) ×3 IMPLANT
KIT HEART LEFT (KITS) ×3 IMPLANT
PACK CARDIAC CATHETERIZATION (CUSTOM PROCEDURE TRAY) ×3 IMPLANT
TRANSDUCER W/STOPCOCK (MISCELLANEOUS) ×3 IMPLANT
TUBING CIL FLEX 10 FLL-RA (TUBING) ×3 IMPLANT
WIRE SAFE-T 1.5MM-J .035X260CM (WIRE) ×3 IMPLANT

## 2015-08-12 NOTE — Progress Notes (Signed)
  Echocardiogram 2D Echocardiogram has been performed.  Diamond Nickel 08/12/2015, 4:03 PM

## 2015-08-12 NOTE — Interval H&P Note (Signed)
Cath Lab Visit (complete for each Cath Lab visit)  Clinical Evaluation Leading to the Procedure:   ACS: No.  Non-ACS:    Anginal Classification: CCS III  Anti-ischemic medical therapy: Maximal Therapy (2 or more classes of medications)  Non-Invasive Test Results: No non-invasive testing performed  Prior CABG: No previous CABG      History and Physical Interval Note:  08/12/2015 7:26 AM  Richard [name]  has presented today for surgery, with the diagnosis of cp/cad/shortness of breath  The various methods of treatment have been discussed with the patient and family. After consideration of risks, benefits and other options for treatment, the patient has consented to  Procedure(s): Left Heart Cath and Coronary Angiography (N/A) as a surgical intervention .  The patient's history has been reviewed, patient examined, no change in status, stable for surgery.  I have reviewed the patient's chart and labs.  Questions were answered to the patient's satisfaction.     Sinclair Grooms

## 2015-08-12 NOTE — Consult Note (Signed)
Requesting Physician: Dr.      Luiz Iron for consultation:  HPI:                                                                                                                                         Richard [name] is an 50 y.o. male patient who presented for routine outpatient cardiac catheterization today. Post-cath, he experienced a transient symptoms of left eye only, left visual field loss. This lasted about 10 minutes. After that the symptoms are completely resolved.  Per RN report patient went to cath lab at 0705 at his baseline. He received Versed and Fentanyl during the case as well as 4500 units of Heparin. Patient to cath lab holding at 0835 and c/o left eye left field visual loss. Bedside RN called a code stroke at [phone].  Date last known well:  08/12/15 Time last known well:  0835 tPA Given: No: Symptoms have resolved, NIHSS 0  Stroke Risk Factors - hyperlipidemia, hypertension and smoking  Past Medical History: Past Medical History  Diagnosis Date  . Chest pain     + palpitations; cath 2005- 30-40% mid LAD, 20% D1, 20% cx, OM, 20-30% RCA, and EF-55%  . COPD (chronic obstructive pulmonary disease) ([place])   . GERD (gastroesophageal reflux disease)   . Hyperlipemia   . Hypertension   . Depression   . Colitis 1990  . Tobacco abuse     1/2 pack per day  . Diabetes mellitus   . Gastric ulcer 2003; 2012    2003: + esophagitis; negative H.pylori serology  2012: Dr. Oneida Alar, mild gastritis, Bravo PH probe placement, negative H.pylori  . Hepatic steatosis   . Chronic low back pain   . Panic attacks   . Asthma     Past Surgical History  Procedure Laterality Date  . Colonoscopy  1990  . Shoulder surgery      Right acromioclavicular joint arthrosis  . Neck mass excision    . Bravo ph study  05/03/2011    [id number] gastritis/normal esophagus and duodenum  . Cardiac catheterization  2005  . Esophagogastroduodenoscopy  05/03/2011    [id number] gastritis    Family  History: Family History  Problem Relation Age of Onset  . Lung cancer Mother   . Alcohol abuse Mother   . Heart attack Father 69  . Diabetes Father   . Alcohol abuse Father   . Hypertension Brother   . Colon cancer Neg Hx   . Drug abuse Neg Hx   . Bipolar disorder Neg Hx   . OCD Neg Hx   . Paranoid behavior Neg Hx   . Schizophrenia Neg Hx   . Sexual abuse Neg Hx   . Physical abuse Neg Hx   . Hypertension Brother   . Anxiety disorder Sister   . Depression Sister   . Dementia Paternal Uncle   . Dementia Cousin   .  Anxiety disorder Sister   . Heart attack Brother 39  . Diabetes Brother   . Hypertension Brother   . Seizures Brother   . ADD / ADHD Daughter     Social History:   reports that he has been smoking Cigarettes.  He started smoking about 33 years ago. He has a 14.5 pack-year smoking history. He has never used smokeless tobacco. He reports that he does not drink alcohol or use illicit drugs.  Allergies:  Allergies  Allergen Reactions  . Dexilant [Dexlansoprazole] Anaphylaxis  . Mushroom Ext Cmplx(Shiitake-Reishi-Mait) Anaphylaxis    Rapid heart rate.  . Penicillins Anaphylaxis    Has patient had a PCN reaction causing immediate rash, facial/tongue/throat swelling, SOB or lightheadedness with hypotension: Yes Has patient had a PCN reaction causing severe rash involving mucus membranes or skin necrosis: No Has patient had a PCN reaction that required hospitalization Yes Has patient had a PCN reaction occurring within the last 10 years: No If all of the above answers are "NO", then may proceed with Cephalosporin use.      Medications:                                                                                                                         Current facility-administered medications:  .   stroke: mapping our early stages of recovery book, , Does not apply, Once, Vittoria Noreen Fuller Mandril, MD .  0.9 %  sodium chloride infusion, 250 mL, Intravenous,  PRN, Herminio Commons, MD .  0.9 %  sodium chloride infusion, 250 mL, Intravenous, PRN, Belva Crome, MD .  0.9 %  sodium chloride infusion, , Intravenous, Continuous, Rhys Lichty Fuller Mandril, MD .  [EXPIRED] 0.9% sodium chloride infusion, 3 mL/kg/hr, Intravenous, Continuous, Last Rate: 285.9 mL/hr at 08/12/15 0622, 3 mL/kg/hr at 08/12/15 0622 **FOLLOWED BY** 0.9% sodium chloride infusion, 1 mL/kg/hr, Intravenous, Continuous, Herminio Commons, MD, Last Rate: 250 mL/hr at 08/12/15 0829, 2.623 mL/kg/hr at 08/12/15 0829 .  0.9% sodium chloride infusion, 1 mL/kg/hr, Intravenous, Continuous, Belva Crome, MD .  acetaminophen (TYLENOL) tablet 650 mg, 650 mg, Oral, Q4H PRN, Belva Crome, MD .  albuterol (PROVENTIL HFA;VENTOLIN HFA) 108 (90 Base) MCG/ACT inhaler 2 puff, 2 puff, Inhalation, Q6H PRN, Evelene Croon Barrett, PA-C .  ALPRAZolam (XANAX) tablet 0.5 mg, 0.5 mg, Oral, QID, Rhonda G Barrett, PA-C .  aspirin suppository 300 mg, 300 mg, Rectal, Daily **OR** aspirin tablet 325 mg, 325 mg, Oral, Daily, Lauranne [place] Fuller Mandril, MD .  cyclobenzaprine (FLEXERIL) tablet 10 mg, 10 mg, Oral, TID PRN, Rhonda G Barrett, PA-C .  heparin injection 5,000 Units, 5,000 Units, Subcutaneous, 3 times per day, Nekisha Mcdiarmid Fuller Mandril, MD .  HYDROcodone-acetaminophen (NORCO/VICODIN) 5-325 MG per tablet 1 tablet, 1 tablet, Oral, Q6H PRN, Evelene Croon Barrett, PA-C .  lisinopril (PRINIVIL,ZESTRIL) tablet 10 mg, 10 mg, Oral, Daily, Rhonda G Barrett, PA-C .  metoprolol succinate (TOPROL-XL) 24 hr tablet 75 mg, 75 mg,  Oral, Daily, Rhonda G Barrett, PA-C .  mometasone-formoterol (DULERA) 100-5 MCG/ACT inhaler 2 puff, 2 puff, Inhalation, BID, Rhonda G Barrett, PA-C .  ondansetron (ZOFRAN) injection 4 mg, 4 mg, Intravenous, Q6H PRN, Belva Crome, MD .  oxyCODONE-acetaminophen (PERCOCET/ROXICET) 5-325 MG per tablet 1-2 tablet, 1-2 tablet, Oral, Q4H PRN, Belva Crome, MD .  pantoprazole (PROTONIX) EC tablet 80 mg, 80  mg, Oral, QHS, Rhonda G Barrett, PA-C .  pravastatin (PRAVACHOL) tablet 20 mg, 20 mg, Oral, q1800, Rhonda G Barrett, PA-C .  senna-docusate (Senokot-S) tablet 1 tablet, 1 tablet, Oral, QHS PRN, Letzy Gullickson Fuller Mandril, MD .  sodium chloride 0.9 % injection 3 mL, 3 mL, Intravenous, Q12H, Herminio Commons, MD .  sodium chloride 0.9 % injection 3 mL, 3 mL, Intravenous, PRN, Herminio Commons, MD .  sodium chloride flush (NS) 0.9 % injection 3 mL, 3 mL, Intravenous, Q12H, Belva Crome, MD .  sodium chloride flush (NS) 0.9 % injection 3 mL, 3 mL, Intravenous, PRN, Belva Crome, MD   ROS:                                                                                                                                       History obtained from the patient  General ROS: negative for - chills, fatigue, fever, night sweats, weight gain or weight loss Psychological ROS: negative for - behavioral disorder, hallucinations, memory difficulties, mood swings or suicidal ideation Ophthalmic ROS: negative for - blurry vision, double vision, eye pain or loss of vision ENT ROS: negative for - epistaxis, nasal discharge, oral lesions, sore throat, tinnitus or vertigo Allergy and Immunology ROS: negative for - hives or itchy/watery eyes Hematological and Lymphatic ROS: negative for - bleeding problems, bruising or swollen lymph nodes Endocrine ROS: negative for - galactorrhea, hair pattern changes, polydipsia/polyuria or temperature intolerance Respiratory ROS: negative for - cough, hemoptysis, shortness of breath or wheezing Cardiovascular ROS: negative for - chest pain, dyspnea on exertion, edema or irregular heartbeat Gastrointestinal ROS: negative for - abdominal pain, diarrhea, hematemesis, nausea/vomiting or stool incontinence Genito-Urinary ROS: negative for - dysuria, hematuria, incontinence or urinary frequency/urgency Musculoskeletal ROS: negative for - joint swelling or muscular  weakness Neurological ROS: as noted in HPI Dermatological ROS: negative for rash and skin lesion changes  Neurologic Examination:  Blood pressure 137/73, pulse 58, temperature 97.6 F (36.4 C), temperature source Oral, resp. rate 16, height 5' 11.5" (1.816 m), weight 93.441 kg (206 lb), SpO2 97 %.  Evaluation of higher integrative functions including: Level of alertness: Alert,  Oriented to time, place and person Recent and remote memory - intact   Attention span and concentration  - intact   Speech: fluent, no evidence of dysarthria or aphasia noted.  Test the following cranial nerves: 2-12 grossly intact Motor examination: Normal tone, bulk, full 5/5 motor strength in all 4 extremities Examination of sensation : Normal and symmetric sensation to pinprick in all 4 extremities and on face Examination of deep tendon reflexes: 2+, normal and symmetric in all extremities, normal plantars bilaterally Test coordination: Normal finger nose testing, with no evidence of limb appendicular ataxia or abnormal involuntary movements or tremors noted.  Gait: Deferred   Lab Results: Basic Metabolic Panel:  Recent Labs Lab 08/12/15 0646  NA 138  K 4.2  CL 101  CO2 25  GLUCOSE 137*  BUN 12  CREATININE 0.89  CALCIUM 9.2    Liver Function Tests: No results for input(s): AST, ALT, ALKPHOS, BILITOT, PROT, ALBUMIN in the last 168 hours. No results for input(s): LIPASE, AMYLASE in the last 168 hours. No results for input(s): AMMONIA in the last 168 hours.  CBC:  Recent Labs Lab 08/12/15 0646  WBC 6.6  HGB 15.7  HCT 45.0  MCV 90.2  PLT 232    Cardiac Enzymes: No results for input(s): CKTOTAL, CKMB, CKMBINDEX, TROPONINI in the last 168 hours.  Lipid Panel: No results for input(s): CHOL, TRIG, HDL, CHOLHDL, VLDL, LDLCALC in the last 168 hours.  CBG:  Recent Labs Lab 08/12/15 0614  08/12/15 0843  GLUCAP 124* 128*    Microbiology: No results found for this or any previous visit.   Imaging: Ct Head Code Stroke W/o Cm  08/12/2015  CLINICAL DATA:  Code stroke. Sudden left-sided hemianopsia following cardiac catheterization. Episode lasted 10 minutes and has resolved. EXAM: CT HEAD WITHOUT CONTRAST TECHNIQUE: Contiguous axial images were obtained from the base of the skull through the vertex without intravenous contrast. COMPARISON:  None. FINDINGS: No acute cortical infarct, hemorrhage, or mass lesion is present. The ventricles are of normal size. No significant extra-axial fluid collection is evident. The paranasal sinuses and mastoid air cells are clear. The calvarium is intact. The basal ganglia are intact.  The insular ribbon is normal. Mild mucosal thickening is present in the maxillary sinuses, anterior ethmoid air cells, and left frontal sinus. The paranasal sinuses and mastoid air cells are otherwise clear. No significant extracranial soft tissue lesions are present. The globes and orbits are intact. ASPECTS score = 10/10 [place] Stroke Program Early CT Score Normal score = 10 IMPRESSION: 1. Normal CT appearance of the brain. 2. Minimal sinus disease. These results were called by telephone at the time of interpretation on 08/12/2015 at 9:16 am to Dr. Audria Nine , who verbally acknowledged these results. Electronically Signed   By: San Morelle M.D.   On: 08/12/2015 09:17    Assessment and plan:   VIRAAT SOUDER is an 50 y.o. male patient who had transient left eye of the left visual field loss for a few minutes post cardiac catheterization procedure. He was a symptomatic at the time of my evaluation. Hence he was not considered a candidate for acute IV TPA. Possible cerebral embolic event causing a transient ischemic attack involving the right PCA vascular territory. Recommend  MRI of the brain, MRA of the head for further neurodiagnostic evaluation. Few minutes  later after patient was transported back to the surgical holding area , he reported left thumb numbness. He was made to lie down supine from sitting position. The numbness symptoms have resolved quickly within a couple of minutes. I reevaluated the patient and he was again a symptomatic at that time with nonfocal neurological examination. Started on IV fluids.  He'll be admitted for overnight monitoring. Recommend echocardiogram to rule out cardiac thrombus, placed on cardiac telemetry. Continue aspirin 325 mg daily.  Physical therapy evaluation prior to discharge.  Neurology service will continue to follow up. Please call for any further questions.

## 2015-08-12 NOTE — Progress Notes (Signed)
Per Stroke team nurses IV fluids increased to 999 to give another 250cc bolus

## 2015-08-12 NOTE — H&P (View-Only) (Signed)
Patient ID: Richard [name], male   DOB: [birth year], 50 y.o.   MRN: [id number]      SUBJECTIVE: The patient presents for follow-up after undergoing spirometry for progressive exertional dyspnea, which demonstrated minimal airway obstruction.  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  He underwent a low risk Lexiscan Cardiolite stress test in September 2014, with no evidence of myocardial ischemia or scar and normal left ventricular systolic function. He follows with a psychiatrist.  Has palpitations at night. Continues to have exertional chest pain and dyspnea and is unable to perform activities he was able to do one or two years ago.  Review of Systems: As per "subjective", otherwise negative.  Allergies  Allergen Reactions  . Dexilant [Dexlansoprazole] Anaphylaxis  . Mushroom Ext Cmplx(Shiitake-Reishi-Mait) Anaphylaxis    Rapid heart rate.  . Penicillins Anaphylaxis    Current Outpatient Prescriptions  Medication Sig Dispense Refill  . acetaminophen (TYLENOL) 325 MG tablet Take 650 mg by mouth every 6 (six) hours as needed for pain or fever.     [name] albuterol (VENTOLIN HFA) 108 (90 BASE) MCG/ACT inhaler Inhale 2 puffs into the lungs every 6 (six) hours as needed for wheezing or shortness of breath.     . ALPRAZolam (XANAX) 0.5 MG tablet Take 1 tablet (0.5 mg total) by mouth 4 (four) times daily. 120 tablet 2  . aspirin 81 MG tablet Take 81 mg by mouth at bedtime.     . cyclobenzaprine (FLEXERIL) 10 MG tablet Take 1 tablet (10 mg total) by mouth 3 (three) times daily as needed for muscle spasms. 30 tablet 2  . esomeprazole (NEXIUM) 40 MG capsule Take 1 capsule (40 mg total) by mouth 2 (two) times daily before a meal. (Patient taking differently:  Take 40 mg by mouth at bedtime. ) 180 capsule 3  . Fluticasone-Salmeterol (ADVAIR) 250-50 MCG/DOSE AEPB Inhale 1 puff into the lungs every 12 (twelve) hours.    [name] HYDROcodone-acetaminophen (NORCO/VICODIN) 5-325 MG per tablet Take 1 tablet by mouth every 6 (six) hours as needed for pain. 90 tablet 1  . ibuprofen (ADVIL,MOTRIN) 600 MG tablet Take 1 tablet (600 mg total) by mouth every 8 (eight) hours as needed for moderate pain. 30 tablet 1  . lisinopril (PRINIVIL,ZESTRIL) 10 MG tablet Take 1 tablet (10 mg total) by mouth daily. 30 tablet 6  . lovastatin (MEVACOR) 20 MG tablet TAKE ONE TABLET BY MOUTH ONCE DAILY AT 6:OO PM 30 tablet 6  . metFORMIN (GLUCOPHAGE) 500 MG tablet Take 1 tablet (500 mg total) by mouth 2 (two) times daily with a meal. 30 tablet 2  . metoprolol succinate (TOPROL-XL) 50 MG 24 hr tablet TAKE ONE & ONE-HALF TABLETS BY MOUTH ONCE DAILY 45 tablet 3  . nitroGLYCERIN (NITROSTAT) 0.4 MG SL tablet Place 1 tablet (0.4 mg total) under the tongue every 5 (five) minutes as needed for chest pain. 25 tablet 3   No current facility-administered medications for this visit.    Past Medical History  Diagnosis Date  . Chest pain     + palpitations; cath 2005- 30-40% mid LAD, 20% D1, 20% cx, OM, 20-30% RCA, and EF-55%  . COPD (chronic obstructive pulmonary disease) ([place])   . GERD (gastroesophageal reflux disease)   . Hyperlipemia   . Hypertension   .  Depression   . Colitis 1990  . Tobacco abuse     1/2 pack per day  . Diabetes mellitus   . Gastric ulcer 2003; 2012    2003: + esophagitis; negative H.pylori serology  2012: Dr. Oneida Alar, mild gastritis, Bravo PH probe placement, negative H.pylori  . Hepatic steatosis   . Chronic low back pain   . Panic attacks   . Asthma     Past Surgical History  Procedure Laterality Date  . Colonoscopy  1990  . Shoulder surgery      Right acromioclavicular joint arthrosis  . Neck mass excision    . Bravo ph study  05/03/2011    [id number]  gastritis/normal esophagus and duodenum  . Cardiac catheterization  2005  . Esophagogastroduodenoscopy  05/03/2011    [id number] gastritis    Social History   Social History  . Marital Status: Married    Spouse Name: N/A  . Number of Children: N/A  . Years of Education: N/A   Occupational History  . full time    Social History Main Topics  . Smoking status: Current Every Day Smoker -- 0.50 packs/day for 29 years    Types: Cigarettes    Start date: 07/17/1982  . Smokeless tobacco: Never Used     Comment: on the verge of quitting as of 01-03-13  . Alcohol Use: No  . Drug Use: No  . Sexual Activity: Yes   Other Topics Concern  . Not on file   Social History Narrative     Filed Vitals:   08/03/15 1100  BP: 114/75  Pulse: 57  Height: 5\' 11"  (1.803 m)  Weight: 210 lb (95.255 kg)    PHYSICAL EXAM General: NAD HEENT: Normal. Neck: No JVD, no thyromegaly. Lungs: Clear to auscultation bilaterally with normal respiratory effort. CV: Nondisplaced PMI. Regular rate and rhythm, normal S1/S2, no S3/S4, no murmur. No pretibial or periankle edema. No carotid bruit.  Abdomen: Firm, obese.  Neurologic: Alert and oriented x 3.  Psych: Normal affect. Skin: Normal. Musculoskeletal: No gross deformities. Extremities: No clubbing or cyanosis.   ECG: Most recent ECG reviewed.      ASSESSMENT AND PLAN: 1. Chest pain and SOB in context of CAD: Normal Lexiscan Cardiolite stress test in 03/2013. Spirometry unremarkable in 2016. No changes to therapy which includes ASA, metoprolol, and statin. Given lifestyle limiting symptoms, will proceed with coronary angiography.  2. Essential HTN: Controlled on current therapy. No changes.  3. Palpitations: Takes metoprolol. No indication for cardiac Holter or event monitoring. Check BMET due to h/o hypokalemia.  4. Hyperlipidemia: TG 204, HDL 28, LDL 77. On lovastatin.   Dispo: f/u after cath  Kate Sable, M.D., F.A.C.C.

## 2015-08-12 NOTE — Progress Notes (Signed)
Armanda Heritage notified of client's b/p and she will order iv fluids

## 2015-08-12 NOTE — Progress Notes (Signed)
Client c/o vision loss left eye; Dr Tamala Julian notified and code stroke called

## 2015-08-12 NOTE — Progress Notes (Signed)
TR BAND REMOVAL  LOCATION:  right radial  DEFLATED PER PROTOCOL:  Yes.    TIME BAND OFF / DRESSING APPLIED:   1500   SITE UPON ARRIVAL:   Level 0  SITE AFTER BAND REMOVAL:  Level 0  CIRCULATION SENSATION AND MOVEMENT:  Within Normal Limits  Yes.    COMMENTS:    

## 2015-08-12 NOTE — H&P (Signed)
Richard [name] is a 50 y.o. male  Admit Date: 08/12/2015 Referring Physician: Bronson Ing Primary Cardiologist: Sam Chief complaint / reason for admission: Transient left eye vision disturbance following diagnostic cath  HPI: 50 year old gentleman with history of anxiety disorder, COPD, tobacco abuse, hyperlipidemia, hypertension, depression, gastric ulcer disease, chronic low back pain, diabetes mellitus, and panic attacks who had diagnostic coronary angiography performed this morning. The referral for angiography was made after the patient complained for several weeks of dyspnea on exertion and vague chest discomfort. His main concern however is that of palpitations. According to the patient's wife and other relatives he has been debilitated by the occurrence of palpitations that frighten him and caused him to feel like he may die. He had a prior nuclear study a proximally 2 years ago that was low risk. Angiography performed greater than 10 years ago did not reveal significant obstruction. He has no cardiopulmonary complaints today prior to procedure.  Post catheterization, while being wheeled from the cath lab to Short Stay he complained of decreased vision in his left periphery and only his left eye. States that vision in the right eye was completely normal. Within 10 minutes, this sensation is completely resolved. The complain however later activation of a Code Stroke protocol. CT scan and neurological exam were both normal. 45 minutes later he began complaining of left thumb numbness and hand weakness. No obvious neurological abnormality was noted on exam. He is now back to normal (3:30 PM).    PMH:    Past Medical History  Diagnosis Date  . Chest pain     + palpitations; cath 2005- 30-40% mid LAD, 20% D1, 20% cx, OM, 20-30% RCA, and EF-55%  . COPD (chronic obstructive pulmonary disease) ([place])   . GERD (gastroesophageal reflux disease)   . Hyperlipemia   . Hypertension   . Depression    . Colitis 1990  . Tobacco abuse     1/2 pack per day  . Gastric ulcer 2003; 2012    2003: + esophagitis; negative H.pylori serology  2012: Dr. Oneida Alar, mild gastritis, Bravo PH probe placement, negative H.pylori  . Hepatic steatosis   . Chronic low back pain   . Panic attacks   . Asthma   . Pneumonia 2000s X 2  . Type II diabetes mellitus ([place])   . History of hiatal hernia   . Arthritis     "legs, spine, shoulders" (08/12/2015)    PSH:    Past Surgical History  Procedure Laterality Date  . Colonoscopy  1990  . Shoulder arthroscopy w/ rotator cuff repair Right 2006    acromioclavicular joint arthrosis  . Neck mass excision Right     "done in dr's office; behind right ear/side of ncek"  . Bravo ph study  05/03/2011    [id number] gastritis/normal esophagus and duodenum  . Esophagogastroduodenoscopy  05/03/2011    [id number] gastritis  . Cardiac catheterization  1990s X 1; 2005; 08/12/2015   ALLERGIES:   Dexilant; Mushroom ext cmplx(shiitake-reishi-mait); and Penicillins Prior to Admit Meds:   Prescriptions prior to admission  Medication Sig Dispense Refill Last Dose  . albuterol (VENTOLIN HFA) 108 (90 BASE) MCG/ACT inhaler Inhale 2 puffs into the lungs every 6 (six) hours as needed for wheezing or shortness of breath.    08/11/2015 at Unknown time  . ALPRAZolam (XANAX) 0.5 MG tablet Take 1 tablet (0.5 mg total) by mouth 4 (four) times daily. (Patient taking differently: Take 0.5 mg by mouth at bedtime. ) 120  tablet 2 08/11/2015 at Unknown time  . aspirin 81 MG tablet Take 81 mg by mouth at bedtime.    08/11/2015 at 2130  . esomeprazole (NEXIUM) 40 MG capsule Take 1 capsule (40 mg total) by mouth 2 (two) times daily before a meal. (Patient taking differently: Take 40 mg by mouth at bedtime. ) 180 capsule 3 08/10/2015  . Fluticasone-Salmeterol (ADVAIR) 250-50 MCG/DOSE AEPB Inhale 1 puff into the lungs every 12 (twelve) hours.   Past Month at Unknown time  . HYDROcodone-acetaminophen  (NORCO/VICODIN) 5-325 MG per tablet Take 1 tablet by mouth every 6 (six) hours as needed for pain. 90 tablet 1 Past Week at Unknown time  . lisinopril (PRINIVIL,ZESTRIL) 10 MG tablet Take 1 tablet (10 mg total) by mouth daily. 30 tablet 6 08/11/2015 at Unknown time  . lovastatin (MEVACOR) 20 MG tablet TAKE ONE TABLET BY MOUTH ONCE DAILY AT 6:OO PM 30 tablet 6 08/11/2015 at Unknown time  . metFORMIN (GLUCOPHAGE) 500 MG tablet Take 1 tablet (500 mg total) by mouth 2 (two) times daily with a meal. 30 tablet 2 08/10/2015  . metoprolol succinate (TOPROL-XL) 50 MG 24 hr tablet TAKE ONE & ONE-HALF TABLETS BY MOUTH ONCE DAILY 45 tablet 3 08/11/2015 at 2130  . acetaminophen (TYLENOL) 325 MG tablet Take 650 mg by mouth every 6 (six) hours as needed for pain or fever.    More than a month at Unknown time  . cyclobenzaprine (FLEXERIL) 10 MG tablet Take 1 tablet (10 mg total) by mouth 3 (three) times daily as needed for muscle spasms. 30 tablet 2 More than a month at Unknown time  . ibuprofen (ADVIL,MOTRIN) 600 MG tablet Take 1 tablet (600 mg total) by mouth every 8 (eight) hours as needed for moderate pain. 30 tablet 1 More than a month at Unknown time  . nitroGLYCERIN (NITROSTAT) 0.4 MG SL tablet Place 1 tablet (0.4 mg total) under the tongue every 5 (five) minutes as needed for chest pain. 25 tablet 3 More than a month at Unknown time   Family HX:    Family History  Problem Relation Age of Onset  . Lung cancer Mother   . Alcohol abuse Mother   . Heart attack Father 70  . Diabetes Father   . Alcohol abuse Father   . Hypertension Brother   . Colon cancer Neg Hx   . Drug abuse Neg Hx   . Bipolar disorder Neg Hx   . OCD Neg Hx   . Paranoid behavior Neg Hx   . Schizophrenia Neg Hx   . Sexual abuse Neg Hx   . Physical abuse Neg Hx   . Hypertension Brother   . Anxiety disorder Sister   . Depression Sister   . Dementia Paternal Uncle   . Dementia Cousin   . Anxiety disorder Sister   . Heart attack Brother  62  . Diabetes Brother   . Hypertension Brother   . Seizures Brother   . ADD / ADHD Daughter    Social HX:    Social History   Social History  . Marital Status: Married    Spouse Name: N/A  . Number of Children: N/A  . Years of Education: N/A   Occupational History  . full time    Social History Main Topics  . Smoking status: Current Every Day Smoker -- 0.50 packs/day for 25 years    Types: Cigarettes    Start date: 07/17/1982  . Smokeless tobacco: Never Used  . Alcohol  Use: No  . Drug Use: No  . Sexual Activity: Yes   Other Topics Concern  . Not on file   Social History Narrative     ROS  Very talkative. Anxiety issues. Palpitations. Large quantities of caffeinated soft drinks are consumed on a daily basis.  Physical Exam: Blood pressure 106/66, pulse 54, temperature 98 F (36.7 C), temperature source Oral, resp. rate 15, height 5' 11.5" (1.816 m), weight 206 lb (93.441 kg), SpO2 96 %.    Appears calm. No facial asymmetry. HEENT exam is unremarkable. No cranial nerve deficits are noted. Pupils equally reactive. Neck exam reveals no JVD or carotid bruits. Chest scattered auscultation and percussion. Cardiac exam reveals no gallop, rub, click, or murmur. Abdomen is soft. Bowel sounds are normal. No masses are noted. Extremities reveal no edema. Femoral pulses and radial pulses are 2+ and symmetric. Neurological exam reveals a patient who is very age his but exhibits no neurological deficit.  Labs: Lab Results  Component Value Date   WBC 6.6 08/12/2015   HGB 15.7 08/12/2015   HCT 45.0 08/12/2015   MCV 90.2 08/12/2015   PLT 232 08/12/2015    Recent Labs Lab 08/12/15 0646  NA 138  K 4.2  CL 101  CO2 25  BUN 12  CREATININE 0.89  CALCIUM 9.2  GLUCOSE 137*   Lab Results  Component Value Date   CKTOTAL 82 01/27/2008   CKMB 1.1 01/27/2008   TROPONINI <0.03 09/08/2014     Radiology:  Dg Chest Port 1 View  08/12/2015  CLINICAL DATA:  TIA. Pt  currently has no chest complaints. Hx of HTN, DM, COPD. Smoker. EXAM: PORTABLE CHEST 1 VIEW COMPARISON:  09/07/2014 FINDINGS: The heart size and mediastinal contours are within normal limits. Both lungs are clear. The visualized skeletal structures are unremarkable. IMPRESSION: No active disease. Electronically Signed   By: Nolon Nations M.D.   On: 08/12/2015 12:59   Ct Head Code Stroke W/o Cm  08/12/2015  CLINICAL DATA:  Code stroke. Sudden left-sided hemianopsia following cardiac catheterization. Episode lasted 10 minutes and has resolved. EXAM: CT HEAD WITHOUT CONTRAST TECHNIQUE: Contiguous axial images were obtained from the base of the skull through the vertex without intravenous contrast. COMPARISON:  None. FINDINGS: No acute cortical infarct, hemorrhage, or mass lesion is present. The ventricles are of normal size. No significant extra-axial fluid collection is evident. The paranasal sinuses and mastoid air cells are clear. The calvarium is intact. The basal ganglia are intact.  The insular ribbon is normal. Mild mucosal thickening is present in the maxillary sinuses, anterior ethmoid air cells, and left frontal sinus. The paranasal sinuses and mastoid air cells are otherwise clear. No significant extracranial soft tissue lesions are present. The globes and orbits are intact. ASPECTS score = 10/10 [place] Stroke Program Early CT Score Normal score = 10 IMPRESSION: 1. Normal CT appearance of the brain. 2. Minimal sinus disease. These results were called by telephone at the time of interpretation on 08/12/2015 at 9:16 am to Dr. Audria Nine , who verbally acknowledged these results. Electronically Signed   By: San Morelle M.D.   On: 08/12/2015 09:17    EKG:  Sinus bradycardia, nonspecific T wave flattening, otherwise normal.  ASSESSMENT:  1. Transient neurological complaints as outlined above, now resolved, with normal neurological exam and negative head CT. 2. Palpitations, without  etiology being identified. 3. Vague complaints of chest discomfort and dyspnea but without any significant left ventricular dysfunction or coronary disease identified on  angiography earlier today. 4. Anxiety disorder  Plan:  1. After discussing with neurology, the patient has been admitted for evaluation of transient ischemic attack. 2. No further cardiac evaluation  Sinclair Grooms 08/12/2015 3:53 PM

## 2015-08-12 NOTE — Research (Signed)
[place] Study Informed Consent   Subject Name: Richard [name]  Subject met inclusion and exclusion criteria.  The informed consent form, study requirements and expectations were reviewed with the subject and questions and concerns were addressed prior to the signing of the consent form.  The subject verbalized understanding of the trial requirements.  The subject agreed to participate in the [place] trial and signed the informed consent.  The informed consent was obtained prior to performance of any protocol-specific procedures for the subject.  A copy of the signed informed consent was given to the subject and a copy was placed in the subject's medical record.  Marlana Salvage 08/12/2015, 06:50 AM

## 2015-08-12 NOTE — Telephone Encounter (Signed)
I called pt. He had cath done today and is recovering. He said that he does not have abdominal pain, it is more of a fluttering feeling and fullness when he eats. He is aware to do the labs and will try to do those in a few days.

## 2015-08-12 NOTE — Code Documentation (Signed)
50yo male here today for scheduled cardiac catheterization.  Per RN report patient went to cath lab at 0705 at his baseline.  He received Versed and Fentanyl during the case as well as 4500 units of Heparin.  Patient to cath lab holding at 0835 and c/o left eye left field visual loss.  Bedside RN called a code stroke at [phone].  Sroke team arrived to the bedside.  Patient's symptoms resolved.  Dr. Tamala Julian at the bedside for exam.  Stroke team proceeded to transport patient to CT where CT head completed.  Dr. Silverio Decamp at the bedside in CT and completed exam.  NIHSS 0, see documentation for details and code stroke times.  Patient transported back to holding.  Stroke swallow screen performed and patient passed, see documentation for details.  Patient is a TIA alert and will be admitted for TIA workup per Dr. Silverio Decamp.  Bedside handoff with Melody, RN.  Instructed to repage code stroke should symptoms reoccur.  Code stroke repaged at [phone].  Stroke team to the bedside.  Patient now reporting left hand numbness.  Patient describes it as numbness from his left thumb to his wrist and reports being unable to hold his cup.  Exam completed and patient with strong left hand grip.  Patient reports decreased sensation to prick on the right hand, however, patient has TR band to right wrist.  Patient feels numbness to left hand is improving.  Patient's HOB placed in flat position and NS bolus given.  Dr. Silverio Decamp to the bedside.  No acute stroke treatment at this time.  Bedside handoff with Melody, RN.

## 2015-08-12 NOTE — Progress Notes (Signed)
Patient transferred from unit 6C to room 5C12 at this time. Alert and in stable condition.

## 2015-08-12 NOTE — Progress Notes (Signed)
C/O left thumb and hand going numb and that couldn't hold cup; called code stroke

## 2015-08-12 NOTE — Progress Notes (Signed)
Notified Dr Silverio Decamp of client's b/p and that 3rd nss bolus of 250cc given and per Dr Silverio Decamp will call cardiology to order iv fluids

## 2015-08-12 NOTE — Progress Notes (Addendum)
   After arriving in short stay, the patient complained of left eye, left field hemianopsia. No right eye vision disturbance. Says he noted this while being wheeled into the short stay [facility] for recovery. He denied headache or other neurological complaint. No difficulty with speech or cognition.  I arrived within 10 minutes of this complaint, and by exam there was no evidence of vision disturbance or focal neurological deficit.  Nursing called a code stroke. Out of abundance of precaution, we will continue with the stroke protocol.

## 2015-08-12 NOTE — Progress Notes (Signed)
Report called to RN for 6-C-5 and transferred via bed with cardiac monitor

## 2015-08-12 NOTE — Telephone Encounter (Signed)
Tried to call, Richard [name], his wife answered, bad connection and got disconnected.

## 2015-08-12 NOTE — Progress Notes (Signed)
Pupils equal and reactive to light.

## 2015-08-12 NOTE — Progress Notes (Signed)
Called to see pt re: TIA  Pt has been seen by neuro. Goal SBP 120-140, 140 preferred. Pt took his Toprol XL 75 mg and lisinopril 10 mg last pm as usual.  Currently asymptomatic, resting well. Refuses nicotine patch.  Continue current rx, will hold lisinopril and decrease Toprol to 25 mg qd to allow BP to run higher.   Ok to eat a small amount of soft food, NPO if he has problems with choking while supine.  Cardiac issues currently stable. Follow neuro issues.   Rosaria Ferries, PA-C 08/12/2015 11:11 AM Beeper [phone]

## 2015-08-13 ENCOUNTER — Other Ambulatory Visit: Payer: Self-pay

## 2015-08-13 ENCOUNTER — Observation Stay (HOSPITAL_BASED_OUTPATIENT_CLINIC_OR_DEPARTMENT_OTHER): Payer: Self-pay

## 2015-08-13 ENCOUNTER — Encounter (HOSPITAL_COMMUNITY): Payer: Self-pay | Admitting: Interventional Cardiology

## 2015-08-13 ENCOUNTER — Other Ambulatory Visit: Payer: Self-pay | Admitting: Physician Assistant

## 2015-08-13 DIAGNOSIS — E785 Hyperlipidemia, unspecified: Secondary | ICD-10-CM

## 2015-08-13 DIAGNOSIS — I10 Essential (primary) hypertension: Secondary | ICD-10-CM

## 2015-08-13 DIAGNOSIS — I2583 Coronary atherosclerosis due to lipid rich plaque: Secondary | ICD-10-CM

## 2015-08-13 DIAGNOSIS — G458 Other transient cerebral ischemic attacks and related syndromes: Secondary | ICD-10-CM

## 2015-08-13 DIAGNOSIS — I2510 Atherosclerotic heart disease of native coronary artery without angina pectoris: Secondary | ICD-10-CM | POA: Insufficient documentation

## 2015-08-13 DIAGNOSIS — G459 Transient cerebral ischemic attack, unspecified: Secondary | ICD-10-CM

## 2015-08-13 LAB — GLUCOSE, CAPILLARY
GLUCOSE-CAPILLARY: 104 mg/dL — AB (ref 65–99)
GLUCOSE-CAPILLARY: 171 mg/dL — AB (ref 65–99)
Glucose-Capillary: 124 mg/dL — ABNORMAL HIGH (ref 65–99)

## 2015-08-13 LAB — LIPID PANEL
Cholesterol: 119 mg/dL (ref 0–200)
HDL: 27 mg/dL — AB (ref >40)
LDL CALC: 64 mg/dL (ref 0–99)
Total CHOL/HDL Ratio: 4.4 RATIO
Triglycerides: 142 mg/dL (ref <150)
VLDL: 28 mg/dL (ref 0–40)

## 2015-08-13 MED ORDER — METOPROLOL SUCCINATE ER 50 MG PO TB24: 50.0000 mg | ORAL_TABLET | Freq: Every day | ORAL | Status: DC

## 2015-08-13 NOTE — Discharge Summary (Signed)
CARDIOLOGY DISCHARGE SUMMARY   Patient ID: RAED ARISPE MRN: [id number] DOB/AGE: 11-28-1965 50 y.o.  Admit date: 08/12/2015 Discharge date: 08/13/2015  PCP: MUSE,ROCHELLE D., PA-C Primary Cardiologist: Dr Bronson Ing  Primary Discharge Diagnosis:   TIA (transient ischemic attack)  Secondary Discharge Diagnosis:    HLD (hyperlipidemia)   TOBACCO ABUSE   Hypertension   Heart palpitations   Pain in the chest  Procedures: Cardiac catheterization, coronary arteriogram, left ventriculogram, 2-D echocardiogram, stat CT of the head, MRI and MRA of the brain  Hospital Course: TEJ KLINDT is a 50 y.o. male with a history of mild to moderate CAD by cath in 2005, HTN, COPD, chronic chest pain, tobacco abuse, GERD, diabetes mellitus, chronic low back pain due to severe lumbar arthritis, anxiety/depression, panic attacks, palpitations, and hyperlipidemia.  He was seen by Dr. Bronson Ing for palpitations, also with exertional dyspnea and chest pain. Cardiac catheterization was recommended and he came to the hospital on 08/12/2015 for the procedure.  Cardiac catheterization results are below. He had nonobstructive coronary artery disease and medical therapy with cardiac risk factor reduction was recommended. He was recommended to quit smoking and will be encouraged to eat a heart healthy diet.   Post-cath, he had a transient left visual field loss the last about 10 minutes. He also had some orthostatic visual changes and numbness L thumb. A Code Stroke was called.  He was seen by neurology and an echocardiogram as well as MRI and MRA were performed. Results are below, there was no acute abnormality. Dr. Marlou Starks recommended an event monitor which was ordered. His blood pressure medicines were decreased to avoid bradycardia (heart rate occasionally 40s) and hypotension. The metoprolol will be decreased from 75 down to 50 mg daily and the lisinopril is currently on hold.  On 08/13/2015, Mr.  Raveling was seen by Dr. Meda Coffee and all data were reviewed. His symptoms are completely resolved and he was ambulating without chest pain, shortness of breath or any other issues. I no further inpatient workup was indicated and he is considered stable for discharge, to follow up as an outpatient.   Labs:   Lab Results  Component Value Date   WBC 6.6 08/12/2015   HGB 15.7 08/12/2015   HCT 45.0 08/12/2015   MCV 90.2 08/12/2015   PLT 232 08/12/2015     Recent Labs Lab 08/12/15 0646  NA 138  K 4.2  CL 101  CO2 25  BUN 12  CREATININE 0.89  CALCIUM 9.2  GLUCOSE 137*   Lipid Panel     Component Value Date/Time   CHOL 119 08/13/2015 0510   TRIG 142 08/13/2015 0510   HDL 27* 08/13/2015 0510   CHOLHDL 4.4 08/13/2015 0510   VLDL 28 08/13/2015 0510   LDLCALC 64 08/13/2015 0510    Recent Labs  08/12/15 0646  INR 0.97      Radiology: Mr Brain Wo Contrast 08/12/2015  CLINICAL DATA:  Initial evaluation for transient left eye visual disturbance following heart catheterization. EXAM: MRI HEAD WITHOUT CONTRAST MRA HEAD WITHOUT CONTRAST TECHNIQUE: Multiplanar, multiecho pulse sequences of the brain and surrounding structures were obtained without intravenous contrast. Angiographic images of the head were obtained using MRA technique without contrast. COMPARISON:  Prior CT from earlier the same day. FINDINGS: MRI HEAD FINDINGS Cerebral volume normal for patient age. No significant white matter disease identified. No abnormal foci of restricted diffusion to suggest acute intracranial infarct. Major intracranial vascular flow voids are maintained. Gray-white matter differentiation  preserved. No acute or chronic intracranial hemorrhage. No areas of chronic infarction. No mass lesion, midline shift, or mass effect. No hydrocephalus. No extra-axial fluid collection. Craniocervical junction within normal limits. Pituitary gland normal. No acute abnormality about the orbits. Axial myopia noted. Mild  mucosal thickening throughout the paranasal sinuses. No air-fluid levels to suggest active sinus infection. No mastoid effusion. Inner ear structures grossly normal. Bone marrow signal intensity within normal limits. No scalp soft tissue abnormality. MRA HEAD FINDINGS ANTERIOR CIRCULATION: Visualized distal cervical segments of the internal carotid arteries are widely patent with antegrade flow. Petrous segments widely patent. Cavernous and supraclinoid ICA is widely patent without stenosis. A1 segments patent. Right A1 segment is dominant. Anterior communicating artery within normal limits. Anterior cerebral arteries well opacified. M1 segments widely patent without stenosis or occlusion. MCA bifurcations within normal limits. Distal MCA branches well opacified and symmetric. POSTERIOR CIRCULATION: Both vertebral arteries widely patent to the vertebrobasilar junction. Posterior inferior cerebral arteries patent bilaterally. Basilar artery widely patent. Superior cerebellar arteries and posterior cerebral arteries well opacified without stenosis or occlusion. No aneurysm. IMPRESSION: MRI HEAD IMPRESSION: Normal MRI of the brain. No acute intracranial infarct or other process identified. MRA HEAD IMPRESSION: Normal intracranial MRA. Electronically Signed   By: Jeannine Boga M.D.   On: 08/12/2015 23:49   Dg Chest Port 1 View 08/12/2015  CLINICAL DATA:  TIA. Pt currently has no chest complaints. Hx of HTN, DM, COPD. Smoker. EXAM: PORTABLE CHEST 1 VIEW COMPARISON:  09/07/2014 FINDINGS: The heart size and mediastinal contours are within normal limits. Both lungs are clear. The visualized skeletal structures are unremarkable. IMPRESSION: No active disease. Electronically Signed   By: Nolon Nations M.D.   On: 08/12/2015 12:59   Ct Head Code Stroke W/o Cm 08/12/2015  CLINICAL DATA:  Code stroke. Sudden left-sided hemianopsia following cardiac catheterization. Episode lasted 10 minutes and has resolved. EXAM:  CT HEAD WITHOUT CONTRAST TECHNIQUE: Contiguous axial images were obtained from the base of the skull through the vertex without intravenous contrast. COMPARISON:  None. FINDINGS: No acute cortical infarct, hemorrhage, or mass lesion is present. The ventricles are of normal size. No significant extra-axial fluid collection is evident. The paranasal sinuses and mastoid air cells are clear. The calvarium is intact. The basal ganglia are intact.  The insular ribbon is normal. Mild mucosal thickening is present in the maxillary sinuses, anterior ethmoid air cells, and left frontal sinus. The paranasal sinuses and mastoid air cells are otherwise clear. No significant extracranial soft tissue lesions are present. The globes and orbits are intact. ASPECTS score = 10/10 [place] Stroke Program Early CT Score Normal score = 10 IMPRESSION: 1. Normal CT appearance of the brain. 2. Minimal sinus disease. These results were called by telephone at the time of interpretation on 08/12/2015 at 9:16 am to Dr. Audria Nine , who verbally acknowledged these results. Electronically Signed   By: San Morelle M.D.   On: 08/12/2015 09:17    Cardiac Cath: 08/12/2015 1. Dist Cx lesion, 65% stenosed. 2. Mid LAD to Dist LAD lesion, 20% stenosed. 3. Prox RCA to Dist RCA lesion, 20% stenosed.  Diffuse nonobstructive atherosclerosis of all 3 coronary arteries.  After intracoronary nitroglycerin, a 50-70% distal circumflex stenosis was identified. It does not appear to be hemodynamically significant.  Normal left ventricular systolic function with ejection fraction of 60%. EDP is up at normal. RECOMMENDATIONS:  Aggressive risk factor modification  Smoking cessation  EKG: 08/13/2015 Sinus bradycardia, heart rate 52 No acute ischemic  changes  Echo: 08/12/2015 - Left ventricle: The cavity size was normal. There was moderate focal basal hypertrophy of the septum and mild hypertrophy of the posterior wall. Systolic  function was normal. The estimated ejection fraction was in the range of 60% to 65%. Wall motion was normal; there were no regional wall motion abnormalities. Left ventricular diastolic function parameters were normal. - Right ventricle: The cavity size was normal. Wall thickness was normal. Systolic function was normal. - Atrial septum: There was an atrial septal aneurysm. - Tricuspid valve: There was trivial regurgitation. - Inferior vena cava: The vessel was normal in size. The respirophasic diameter changes were blunted (< 50%), consistent with elevated central venous pressure.  FOLLOW UP PLANS AND APPOINTMENTS Allergies  Allergen Reactions  . Dexilant [Dexlansoprazole] Anaphylaxis  . Mushroom Ext Cmplx(Shiitake-Reishi-Mait) Anaphylaxis    Rapid heart rate.  . Penicillins Anaphylaxis    Has patient had a PCN reaction causing immediate rash, facial/tongue/throat swelling, SOB or lightheadedness with hypotension: Yes Has patient had a PCN reaction causing severe rash involving mucus membranes or skin necrosis: No Has patient had a PCN reaction that required hospitalization Yes Has patient had a PCN reaction occurring within the last 10 years: No If all of the above answers are "NO", then may proceed with Cephalosporin use.      Medication List    STOP taking these medications        lisinopril 10 MG tablet  Commonly known as:  PRINIVIL,ZESTRIL      TAKE these medications        acetaminophen 325 MG tablet  Commonly known as:  TYLENOL  Take 650 mg by mouth every 6 (six) hours as needed for pain or fever.     ALPRAZolam 0.5 MG tablet  Commonly known as:  XANAX  Take 1 tablet (0.5 mg total) by mouth 4 (four) times daily.     aspirin 81 MG tablet  Take 81 mg by mouth at bedtime.     cyclobenzaprine 10 MG tablet  Commonly known as:  FLEXERIL  Take 1 tablet (10 mg total) by mouth 3 (three) times daily as needed for muscle spasms.     esomeprazole 40 MG  capsule  Commonly known as:  NEXIUM  Take 1 capsule (40 mg total) by mouth 2 (two) times daily before a meal.     Fluticasone-Salmeterol 250-50 MCG/DOSE Aepb  Commonly known as:  ADVAIR  Inhale 1 puff into the lungs every 12 (twelve) hours.     HYDROcodone-acetaminophen 5-325 MG tablet  Commonly known as:  NORCO/VICODIN  Take 1 tablet by mouth every 6 (six) hours as needed for pain.     ibuprofen 600 MG tablet  Commonly known as:  ADVIL,MOTRIN  Take 1 tablet (600 mg total) by mouth every 8 (eight) hours as needed for moderate pain.     lovastatin 20 MG tablet  Commonly known as:  MEVACOR  TAKE ONE TABLET BY MOUTH ONCE DAILY AT 6:OO PM     metFORMIN 500 MG tablet  Commonly known as:  GLUCOPHAGE  Take 1 tablet (500 mg total) by mouth 2 (two) times daily with a meal.     metoprolol succinate 50 MG 24 hr tablet  Commonly known as:  TOPROL-XL  Take 1 tablet (50 mg total) by mouth daily. Take with or immediately following a meal.     nitroGLYCERIN 0.4 MG SL tablet  Commonly known as:  NITROSTAT  Place 1 tablet (0.4 mg total) under the tongue  every 5 (five) minutes as needed for chest pain.     VENTOLIN HFA 108 (90 Base) MCG/ACT inhaler  Generic drug:  albuterol  Inhale 2 puffs into the lungs every 6 (six) hours as needed for wheezing or shortness of breath.        Discharge Instructions    Diet - low sodium heart healthy    Complete by:  As directed      Diet Carb Modified    Complete by:  As directed      Increase activity slowly    Complete by:  As directed           Follow-up Information    Follow up with Herminio Commons, MD.   Specialty:  Cardiology   Why:  The office will call.   Contact information:   [place] [address] [phone]       BRING ALL MEDICATIONS WITH YOU TO FOLLOW UP APPOINTMENTS  Time spent with patient to include physician time: 38 min Signed: Rosaria Ferries, PA-C 08/13/2015, 4:00 PM Co-Sign MD

## 2015-08-13 NOTE — Progress Notes (Signed)
Pt discharged home ambulatory with family at side. Vital signs stable, IV d/c'd. band aid applied.

## 2015-08-13 NOTE — Progress Notes (Signed)
PT Cancellation Note  Patient Details Name: Richard [name] MRN: [id number] DOB: 09-14-1965   Cancelled Treatment:    Reason Eval Not Completed: PT screened, no needs identified, will sign off  Spoke with [place], OT and pt is back to his baseline and mobilizing without balance problems.    Jermesha Sottile 08/13/2015, 1:44 PM  Pager [phone]

## 2015-08-13 NOTE — Progress Notes (Signed)
Occupational Therapy Evaluation Patient Details Name: Richard [name] MRN: [id number] DOB: July 24, 1965 Today's Date: 08/13/2015    History of Present Illness 50 year old gentleman with history of anxiety disorder, COPD, tobacco abuse, hyperlipidemia, hypertension, depression, gastric ulcer disease, chronic low back pain, diabetes mellitus, and panic attacks who had diagnostic coronary angiography performed. Post catheterization, while being wheeled from the cath lab to Short Stay he complained of decreased vision in his left periphery and only his left eye. States that vision in the right eye was completely normal. Within 10 minutes, he stated this sensation is completely resolved.Activation of a Code Stroke protocol. CT scan and neurological exam were both normal. 45 minutes later he began complaining of left thumb numbness and hand weakness.   Clinical Impression   PTA,  Pt independent with ADL and mobility. Does not work. Pt reports this am he had difficulty seeing the clock unless he "turned his head". Reports this has improved and now seems normal. Visual fields appear intact. Sensation has also returned to his hand. Educated pt on warning signs/symptoms of CVA using FAST. Also discussed smoking cessation. Written information given. Pt safe to D/C home when medically stable. OT signing off.    Follow Up Recommendations  No OT follow up;Supervision - Intermittent    Equipment Recommendations  None recommended by OT    Recommendations for Other Services       Precautions / Restrictions Precautions Precautions: None Restrictions Weight Bearing Restrictions: Yes      Mobility Bed Mobility Overal bed mobility: Independent                Transfers Overall transfer level: Independent                    Balance Overall balance assessment: No apparent balance deficits (not formally assessed)                                          ADL Overall  ADL's : At baseline          Recommended pt initially have S during shower/bathing. Discussed driving and having pt verbally tell wife what he seeing when driving to assure he is seeing entire field.  Pt discussing smoking and trying to quit. Will give written smoking cessation educationl material.                                    Vision Vision Assessment?: Yes Eye Alignment: Within Functional Limits Ocular Range of Motion: Within Functional Limits Alignment/Gaze Preference: Within Defined Limits Tracking/Visual Pursuits: Able to track stimulus in all quads without difficulty Saccades: Within functional limits Convergence: Within functional limits Visual Fields: No apparent deficits   Perception     Praxis Praxis Praxis tested?: Within functional limits    Pertinent Vitals/Pain Pain Assessment: 0-10 Pain Score: 3  Pain Location: back Pain Descriptors / Indicators: Discomfort Pain Intervention(s): Limited activity within patient's tolerance     Hand Dominance Right   Extremity/Trunk Assessment Upper Extremity Assessment Upper Extremity Assessment: Overall WFL for tasks assessed   Lower Extremity Assessment Lower Extremity Assessment: Overall WFL for tasks assessed   Cervical / Trunk Assessment Cervical / Trunk Assessment: Normal   Communication Communication Communication: No difficulties   Cognition Arousal/Alertness: Awake/alert Behavior During Therapy: WFL for tasks assessed/performed Overall Cognitive  Status: Within Functional Limits for tasks assessed                     General Comments       Exercises       Shoulder Instructions      Home Living Family/patient expects to be discharged to:: Private residence Living Arrangements: Spouse/significant other Available Help at Discharge: Family;Available 24 hours/day Type of Home: Mobile home Home Access: Stairs to enter Entrance Stairs-Number of Steps: 3 Entrance  Stairs-Rails: Right Home Layout: One level     Bathroom Shower/Tub: Tub/shower unit Shower/tub characteristics: Architectural technologist: Standard Bathroom Accessibility: Yes How Accessible: Accessible via walker Home Equipment: None          Prior Functioning/Environment Level of Independence: Independent        Comments: history of back problems    OT Diagnosis: Disturbance of vision   OT Problem List: Impaired vision/perception   OT Treatment/Interventions:      OT Goals(Current goals can be found in the care plan section) Acute Rehab OT Goals Patient Stated Goal: to go home OT Goal Formulation: All assessment and education complete, DC therapy  OT Frequency:     Barriers to D/C:            Co-evaluation              End of Session Nurse Communication: Mobility status  Activity Tolerance: Patient tolerated treatment well Patient left: in bed;with call bell/phone within reach;with family/visitor present   Time: [id number] OT Time Calculation (min): 23 min Charges:  OT General Charges $OT Visit: 1 Procedure OT Evaluation $OT Eval Low Complexity: 1 Procedure OT Treatments $Therapeutic Activity: 8-22 mins G-Codes: OT G-codes **NOT FOR INPATIENT CLASS** Functional Assessment Tool Used: clinical judgement Functional Limitation: Self care Self Care Current Status [id number]): 0 percent impaired, limited or restricted Self Care Goal Status [id number]): 0 percent impaired, limited or restricted Self Care Discharge Status [id number]): 0 percent impaired, limited or restricted  Jeilani Grupe,HILLARY 08/13/2015, 11:23 AM   Maurie Boettcher, OTR/L  [phone] 08/13/2015

## 2015-08-13 NOTE — Care Management Note (Signed)
Case Management Note  Patient Details  Name: TREYSON WALEK MRN: [id number] Date of Birth: 1965-07-21  Subjective/Objective:                    Action/Plan: Patient presented with Transient left eye vision disturbance following diagnostic cath.  Lives at home with spouse.  Patient is listed as self-pay and is being followed by Caryl Pina in [employer]. CM will follow for discharge needs pending PT/OT evals and physician orders.  Expected Discharge Date:                  Expected Discharge Plan:     In-House Referral:     Discharge planning Services     Post Acute Care Choice:    Choice offered to:     DME Arranged:    DME Agency:     HH Arranged:    HH Agency:     Status of Service:  In process, will continue to follow  Medicare Important Message Given:    Date Medicare IM Given:    Medicare IM give by:    Date Additional Medicare IM Given:    Additional Medicare Important Message give by:     If discussed at [place] of Stay Meetings, dates discussed:    Additional CommentsRolm Baptise, RN 08/13/2015, 11:24 AM [phone]

## 2015-08-13 NOTE — Discharge Instructions (Signed)

## 2015-08-13 NOTE — Evaluation (Signed)
SLP Cancellation Note  Patient Details Name: Richard [name] MRN: [id number] DOB: November 06, 1965   Cancelled treatment:       Reason Eval/Treat Not Completed: Other (comment);SLP screened, no needs identified, will sign off (pt and wife report no speech or cognitive changes with this event,symptoms resolved except vision difficulty, no speech eval indicated)   Claudie Fisherman, [facility] [phone]

## 2015-08-13 NOTE — Progress Notes (Signed)
Discharged to home with family. VSS, no c/o pain

## 2015-08-13 NOTE — Progress Notes (Signed)
VASCULAR LAB PRELIMINARY  PRELIMINARY  PRELIMINARY  PRELIMINARY  Carotid duplex completed.    Preliminary report:  Bilateral:  1-39% ICA stenosis.  Vertebral artery flow is antegrade.     Richard [name], RVS 08/13/2015, 3:46 PM

## 2015-08-13 NOTE — Telephone Encounter (Signed)
If he is not having abdominal pain, I don't think a gb work up will be helpful. He did have gb work up including u/s and HIDA in 2014. Await labs.

## 2015-08-14 LAB — HEMOGLOBIN A1C
HEMOGLOBIN A1C: 7.8 % — AB (ref 4.8–5.6)
Mean Plasma Glucose: 177 mg/dL

## 2015-08-16 ENCOUNTER — Telehealth: Payer: Self-pay | Admitting: Cardiovascular Disease

## 2015-08-16 NOTE — Telephone Encounter (Signed)
Pt's wife is aware he needs to do the LFT's and that no GB workup is warranted at this time. She did say that he had a mini stroke right after his catherization. She also said that the cardiac team told him the fluttering is not coming from his cardiac problems. Please advise!

## 2015-08-16 NOTE — Telephone Encounter (Signed)
Pt wife will monitor BP until Friday's appt with Dr. Bronson Ing

## 2015-08-16 NOTE — Telephone Encounter (Signed)
Patient had his cath last Thursday then had a mini stroke after procedure.  Wife stated that they changed a lot of his medication and she would like to review with nurse

## 2015-08-16 NOTE — Telephone Encounter (Signed)
Tried to call and Vm not set up.

## 2015-08-16 NOTE — Telephone Encounter (Signed)
Pt wife says after d/c from [facility] pt was taken off lisinopril and change Toprol XL 50 mg daily. BP last night was 146/94 and wife wanted to verify that Dr. Bronson Ing was ok with med changes made at d/c. Pt is scheduled to see provider in [place] 08/20/15. Will forward to Dr. Bronson Ing

## 2015-08-16 NOTE — Telephone Encounter (Signed)
For now would continue to monitor. If it remains persistently elevated, I may restart at 5 mg daily.

## 2015-08-20 ENCOUNTER — Encounter: Payer: Self-pay | Admitting: Cardiovascular Disease

## 2015-08-20 ENCOUNTER — Ambulatory Visit (INDEPENDENT_AMBULATORY_CARE_PROVIDER_SITE_OTHER): Payer: Self-pay | Admitting: Cardiovascular Disease

## 2015-08-20 VITALS — BP 138/82 | HR 68 | Ht 71.0 in | Wt 209.0 lb

## 2015-08-20 DIAGNOSIS — F418 Other specified anxiety disorders: Secondary | ICD-10-CM

## 2015-08-20 DIAGNOSIS — I25118 Atherosclerotic heart disease of native coronary artery with other forms of angina pectoris: Secondary | ICD-10-CM

## 2015-08-20 DIAGNOSIS — F17200 Nicotine dependence, unspecified, uncomplicated: Secondary | ICD-10-CM

## 2015-08-20 DIAGNOSIS — E785 Hyperlipidemia, unspecified: Secondary | ICD-10-CM

## 2015-08-20 DIAGNOSIS — I10 Essential (primary) hypertension: Secondary | ICD-10-CM

## 2015-08-20 DIAGNOSIS — R079 Chest pain, unspecified: Secondary | ICD-10-CM

## 2015-08-20 DIAGNOSIS — R002 Palpitations: Secondary | ICD-10-CM

## 2015-08-20 DIAGNOSIS — F329 Major depressive disorder, single episode, unspecified: Secondary | ICD-10-CM

## 2015-08-20 DIAGNOSIS — F419 Anxiety disorder, unspecified: Secondary | ICD-10-CM

## 2015-08-20 NOTE — Patient Instructions (Signed)
Your physician recommends that you schedule a follow-up appointment in: as needed   Thank you for choosing [facility] !         

## 2015-08-20 NOTE — Progress Notes (Signed)
Patient ID: Richard [name], male   DOB: 09-20-1965, 50 y.o.   MRN: [id number]      SUBJECTIVE: The patient presents for follow-up after undergoing coronary angiography which demonstrated nonobstructive disease. He had transient left-sided visual field loss lasting proximally 10 minutes after cath. He was evaluated by neurology. MRI and MRA were performed with no acute abnormalities seen. An event monitor was ordered. He was bradycardic and metoprolol was decreased to 50 mg daily. Specifically, the distal left circumflex had a 65% stenosis , the mid to distal LAD had a 20% stenosis, and the proximal to distal RCA had a 20% stenosis. No lesions were hemodynamically significant. Echocardiography on 08/12/15 demonstrated normal left ventricular systolic function and regional wall motion, LVEF 60-65%.  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  Currently doing well overall. Was very glad to hear that he had no significant blockages and normal cardiac function.   Review of Systems: As per "subjective", otherwise negative.  Allergies  Allergen Reactions  . Dexilant [Dexlansoprazole] Anaphylaxis  . Mushroom Ext Cmplx(Shiitake-Reishi-Mait) Anaphylaxis    Rapid heart rate.  . Penicillins Anaphylaxis    Has patient had a PCN reaction causing immediate rash, facial/tongue/throat swelling, SOB or lightheadedness with hypotension: Yes Has patient had a PCN reaction causing severe rash involving mucus membranes or skin necrosis: No Has patient had a PCN reaction that required hospitalization Yes Has patient had a PCN reaction occurring within the last 10 years: No If all of the above answers are "NO", then may proceed with Cephalosporin use.     Current Outpatient Prescriptions  Medication Sig  Dispense Refill  . acetaminophen (TYLENOL) 325 MG tablet Take 650 mg by mouth every 6 (six) hours as needed for pain or fever.     [name] albuterol (VENTOLIN HFA) 108 (90 BASE) MCG/ACT inhaler Inhale 2 puffs into the lungs every 6 (six) hours as needed for wheezing or shortness of breath.     . ALPRAZolam (XANAX) 0.5 MG tablet Take 1 tablet (0.5 mg total) by mouth 4 (four) times daily. (Patient taking differently: Take 0.5 mg by mouth at bedtime. ) 120 tablet 2  . aspirin 81 MG tablet Take 81 mg by mouth at bedtime.     . cyclobenzaprine (FLEXERIL) 10 MG tablet Take 1 tablet (10 mg total) by mouth 3 (three) times daily as needed for muscle spasms. 30 tablet 2  . esomeprazole (NEXIUM) 40 MG capsule Take 1 capsule (40 mg total) by mouth 2 (two) times daily before a meal. (Patient taking differently: Take 40 mg by mouth at bedtime. ) 180 capsule 3  . Fluticasone-Salmeterol (ADVAIR) 250-50 MCG/DOSE AEPB Inhale 1 puff into the lungs every 12 (twelve) hours.    [name] HYDROcodone-acetaminophen (NORCO/VICODIN) 5-325 MG per tablet Take 1 tablet by mouth every 6 (six) hours as needed for pain. 90 tablet 1  . ibuprofen (ADVIL,MOTRIN) 600 MG tablet Take 1 tablet (600 mg total) by mouth every 8 (eight) hours as needed for moderate pain. 30 tablet 1  . lovastatin (MEVACOR) 20 MG tablet TAKE ONE TABLET BY MOUTH ONCE DAILY AT 6:OO PM 30 tablet 6  . metFORMIN (GLUCOPHAGE) 500 MG tablet Take 1 tablet (500 mg total) by mouth 2 (two) times daily with a meal. 30 tablet 2  . metoprolol succinate (TOPROL-XL) 50 MG 24 hr tablet Take 1  tablet (50 mg total) by mouth daily. Take with or immediately following a meal. 30 tablet 3  . nitroGLYCERIN (NITROSTAT) 0.4 MG SL tablet Place 1 tablet (0.4 mg total) under the tongue every 5 (five) minutes as needed for chest pain. 25 tablet 3   No current facility-administered medications for this visit.    Past Medical History  Diagnosis Date  . Chest pain     + palpitations; cath 2005- 30-40%  mid LAD, 20% D1, 20% cx, OM, 20-30% RCA, and EF-55%  . COPD (chronic obstructive pulmonary disease) ([facility])   . GERD (gastroesophageal reflux disease)   . Hyperlipemia   . Hypertension   . Depression   . Colitis 1990  . Tobacco abuse     1/2 pack per day  . Gastric ulcer 2003; 2012    2003: + esophagitis; negative H.pylori serology  2012: Dr. Oneida Alar, mild gastritis, Bravo PH probe placement, negative H.pylori  . Hepatic steatosis   . Chronic low back pain   . Panic attacks   . Asthma   . Pneumonia 2000s X 2  . Type II diabetes mellitus ([place])   . History of hiatal hernia   . Arthritis     "legs, spine, shoulders" (08/12/2015)    Past Surgical History  Procedure Laterality Date  . Colonoscopy  1990  . Shoulder arthroscopy w/ rotator cuff repair Right 2006    acromioclavicular joint arthrosis  . Neck mass excision Right     "done in dr's office; behind right ear/side of ncek"  . Bravo ph study  05/03/2011    [id number] gastritis/normal esophagus and duodenum  . Esophagogastroduodenoscopy  05/03/2011    [id number] gastritis  . Cardiac catheterization  1990s X 1; 2005; 08/12/2015  . Cardiac catheterization N/A 08/12/2015    Procedure: Left Heart Cath and Coronary Angiography;  Surgeon: Belva Crome, MD; LAD 20%, CFX 65%, RCA 20%, EF 60%     Social History   Social History  . Marital Status: Married    Spouse Name: N/A  . Number of Children: N/A  . Years of Education: N/A   Occupational History  . full time    Social History Main Topics  . Smoking status: Current Every Day Smoker -- 0.50 packs/day for 25 years    Types: Cigarettes    Start date: 07/17/1982  . Smokeless tobacco: Never Used  . Alcohol Use: No  . Drug Use: No  . Sexual Activity: Yes   Other Topics Concern  . Not on file   Social History Narrative     Filed Vitals:   08/20/15 0957  BP: 138/82  Pulse: 68  Height: 5\' 11"  (1.803 m)  Weight: 209 lb (94.802 kg)  SpO2: 98%    PHYSICAL EXAM General:  NAD HEENT: Normal. Neck: No JVD, no thyromegaly. Lungs: Clear to auscultation bilaterally with normal respiratory effort. CV: Nondisplaced PMI.  Regular rate and rhythm, normal S1/S2, no S3/S4, no murmur. No pretibial or periankle edema.  Abdomen: Soft, nontender, no distention.  Neurologic: Alert and oriented.  Psych: Normal affect. Skin: Normal. Musculoskeletal: No gross deformities.  ECG: Most recent ECG reviewed.      ASSESSMENT AND PLAN: 1. Chest pain and SOB in context of nonobstructive CAD: Cath results noted above. Continue ASA, metoprolol, and statin. No further workup is indicated.  2. Essential HTN: Controlled on current therapy. No changes.  3. Palpitations: Takes metoprolol. Stable.  4. Hyperlipidemia: TG 204, HDL 28, LDL 77. On lovastatin.   Dispo:  f/u prn.  Kate Sable, M.D., F.A.C.C.

## 2015-08-23 NOTE — Telephone Encounter (Signed)
Please let patient know that I cannot see correlation of palpitations with GI etiology. Caffeine could be etiology as well as nicotine.  For palpitations, I don't have anything to offer.   He is overdue for colonoscopy whenever he is ready to schedule.  Offer him OV with SLF only.

## 2015-08-23 NOTE — Telephone Encounter (Signed)
Pt was informed. Appt with Dr. Oneida Alar scheduled for 09/09/2015 at 3:00 PM. Pt said he would like to see her before he schedules the colonoscopy.

## 2015-08-26 ENCOUNTER — Encounter: Payer: Self-pay | Admitting: *Deleted

## 2015-08-26 ENCOUNTER — Other Ambulatory Visit: Payer: Self-pay | Admitting: *Deleted

## 2015-08-26 DIAGNOSIS — F411 Generalized anxiety disorder: Secondary | ICD-10-CM

## 2015-08-26 DIAGNOSIS — H547 Unspecified visual loss: Secondary | ICD-10-CM

## 2015-08-26 DIAGNOSIS — G459 Transient cerebral ischemic attack, unspecified: Secondary | ICD-10-CM

## 2015-08-26 NOTE — Patient Outreach (Addendum)
[place] [facility]) Care Management  08/26/2015  Richard [name] 04/26/1966 [id number]   EMMI-Stroke dashboard referral -patient answered yes to -sad, hopeless or empty?  Subjective:  Telephone call to patient who was advised of reason for call.  HIPPA verification received from patient. Patient states he has experienced anxiety since episode because he has financial concerns. States he is disabled due to severe arthritis and has been unsuccessful in obtaining disability benefits after 3 attempts.  States unable to work and has no income. States spouse works Retail buyer.  Currently he goes to [facility] every 3 months and sees specialist. Has access to see counselor if needed. Taking anti-anxiety medication 4 times daily.  Att[place] clinic [place] for [place] care and has seen physician assistant , Royce Macadamia 08/20/2015 for hospital follow up.  Patient voices that he was recently hospitalized after experiencing  vision problem following cardiac catheterization.  States he was advised that he had TIA. Currently states can see out of left eye but has limited peripheral vision to left.   Voices that he is aware that other signs of stroke are slurred speech, dizziness, inability to walk; states he will call 911 if symptoms occur. Agrees to take part in EMMI-Stroke program and accept calls. Agrees to referral to Education officer, museum.   Objective; See medication list as noted. See completed assessments.  Assessment; TIA following heart procedure. Currently has limited left peripheral vision. Having anxiety due to financial & health concerns. Has support from family members.  Plan; Will send to care management assistant to refer to Clinical Social Worker for psychosocial concerns. Will follow up with  EMMI-stroke calls and formulate care plan. Continue with health assessments. Patient agrees with set appointment.  Sherrin Daisy, RN BSN [place] Management Coordinator [facility] Care  Management  [phone]

## 2015-08-30 ENCOUNTER — Encounter: Payer: Self-pay | Admitting: Licensed Clinical Social Worker

## 2015-08-30 ENCOUNTER — Other Ambulatory Visit: Payer: Self-pay | Admitting: Licensed Clinical Social Worker

## 2015-08-30 NOTE — Patient Outreach (Signed)
Assessment:  CSW received referral on [facility] on 08/30/15. CSW completed chart review on client on 08/30/15.  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 Client sees Ms. Muse, physician assistant, at [facility] Department, for medical care.  Sherrin Daisy is [facility] telephonic nurse providing telephonic nursing support for client.  CSW called client on 08/30/15.  CSW spoke with client via phone on 08/30/15.  CSW verified client identity.  CSW and client spoke of client needs.   Client said he has applied 3 times for Disability benefits (last time with attorney helping him) with no success. 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 He said he has reading difficulty and also due to current medical needs has physical limitations (arthritis affects).  He has a reliable car.  He has financial problems and said he is a year behind in his rent.  His wife works part time.  Her hours have been cut. Client has stress related to financial needs.  Client has history of panic attacks/anxiety.  He feels that he has had panic attacks for over 20 years. He said panic attacks are hard to manage.  He said he sees Dr. [name], psychiatrist, for mental health support. He has seen Dr. Harrington Challenger for one year. He sees Dr. Harrington Challenger every 3 months.  He said he knows about relaxtation techniques (deep breathing, taking a nap, listening to music). He said he uses relaxation techniques to help him manage panic attacks and anxiety symptoms. Client said his wife cannot work full time due to her health condition.  Client said he did not have any insurance coverage at present.  Client had history of a work related accident.  Client has some problems getting his medications.  He is taking medications as prescribed.  Client is not on Medicaid and is not able to qualify at present.  Client will see physician assistant, Ms. Darleene Cleaver, as scheduled. Ms. Darleene Cleaver is with the [facility] Department.  He said he is  in debt to a hospital and that this is stressful to him.  He said he receives Food Stamps benefit.  CSW and client completed Financial Assessment Form for client via phone on 08/30/15.  CSW spoke with client about [facility] consent form and that [facility] consent form would be mailed to client to review.  Client said he sees a gastroenterologist as needed.  CSW gave client CSW name and [facility] contact number ([phone]) and encouraged  client to call me at that number to speak of CSW needs of client.  CSW and client spoke of client care plan and client  agreed to try to attend all scheduled client appointments in next 30 days with help of his wife.  CSW thanked client for phone call with CSW on 08/30/15.  Plan: Client to attend all scheduled client medical appointments in next 30 days. CSW to call client in one week to assess client needs.  Norva Riffle.Shermaine Brigham MSW, LCSW Licensed Clinical Social Worker [facility] Care Management [phone]

## 2015-09-01 LAB — HEPATIC FUNCTION PANEL
ALT: 25 U/L (ref 9–46)
AST: 17 U/L (ref 10–35)
Albumin: 4.3 g/dL (ref 3.6–5.1)
Alkaline Phosphatase: 53 U/L (ref 40–115)
Bilirubin, Direct: 0.1 mg/dL (ref <=0.2)
Indirect Bilirubin: 0.2 mg/dL (ref 0.2–1.2)
Total Bilirubin: 0.3 mg/dL (ref 0.2–1.2)
Total Protein: 6.8 g/dL (ref 6.1–8.1)

## 2015-09-03 ENCOUNTER — Encounter (HOSPITAL_COMMUNITY): Payer: Self-pay | Admitting: Psychiatry

## 2015-09-03 ENCOUNTER — Other Ambulatory Visit: Payer: Self-pay | Admitting: *Deleted

## 2015-09-03 ENCOUNTER — Ambulatory Visit (INDEPENDENT_AMBULATORY_CARE_PROVIDER_SITE_OTHER): Payer: Self-pay | Admitting: Psychiatry

## 2015-09-03 VITALS — BP 134/92 | HR 60 | Ht 71.0 in | Wt 210.8 lb

## 2015-09-03 DIAGNOSIS — F332 Major depressive disorder, recurrent severe without psychotic features: Secondary | ICD-10-CM

## 2015-09-03 DIAGNOSIS — G8929 Other chronic pain: Secondary | ICD-10-CM

## 2015-09-03 DIAGNOSIS — F411 Generalized anxiety disorder: Secondary | ICD-10-CM

## 2015-09-03 DIAGNOSIS — F410 Panic disorder [episodic paroxysmal anxiety] without agoraphobia: Secondary | ICD-10-CM

## 2015-09-03 DIAGNOSIS — F329 Major depressive disorder, single episode, unspecified: Secondary | ICD-10-CM

## 2015-09-03 DIAGNOSIS — M545 Low back pain, unspecified: Secondary | ICD-10-CM

## 2015-09-03 DIAGNOSIS — F17200 Nicotine dependence, unspecified, uncomplicated: Secondary | ICD-10-CM

## 2015-09-03 DIAGNOSIS — F419 Anxiety disorder, unspecified: Principal | ICD-10-CM

## 2015-09-03 MED ORDER — ALPRAZOLAM 0.5 MG PO TABS: ORAL_TABLET | ORAL | Status: DC

## 2015-09-03 NOTE — Patient Outreach (Signed)
[place] [facility]) Care Management  09/03/2015  Richard [name] 09/07/1965 [id number]   EMMI-stroke follow up: Telephone call to patient who voices that he has not had any hospital admissions or emergency room visits since recent hospital stay. Patient voices understanding of importance of calling 911 if stroke symptoms occur. Voices that he is taking medications as prescribed. States has reported to all scheduled appointments with doctors following hospital stay.   Plan;  Will follow up with patient next week and follow care plan as noted. Patient agrees with set appointment. Sherrin Daisy, RN BSN [place] Management Coordinator [facility] Care Management  [phone]

## 2015-09-03 NOTE — Progress Notes (Signed)
Patient ID: Richard [name], male   DOB: 01-Dec-1965, 50 y.o.   MRN: [id number] Patient ID: Richard [name], male   DOB: July 03, 1966, 50 y.o.   MRN: [id number] Patient ID: Richard [name], male   DOB: Mar 03, 1966, 50 y.o.   MRN: [id number] Patient ID: Richard [name], male   DOB: 1965-10-29, 50 y.o.   MRN: [id number] Patient ID: Richard [name], male   DOB: 1965-12-07, 50 y.o.   MRN: [id number] Patient ID: Richard [name], male   DOB: 01/23/1966, 50 y.o.   MRN: [id number] Patient ID: Richard [name], male   DOB: 04-25-1966, 50 y.o.   MRN: [id number] Patient ID: Richard [name], male   DOB: Sep 24, 1965, 50 y.o.   MRN: [id number] Patient ID: Richard [name], male   DOB: May 29, 1966, 50 y.o.   MRN: [id number] Patient ID: Richard [name], male   DOB: 23-Feb-1966, 50 y.o.   MRN: [id number] Patient ID: Richard [name], male   DOB: [birth year], 50 y.o.   MRN: [id number] Patient ID: Richard [name], male   DOB: 1966/04/18, 50 y.o.   MRN: [id number] Patient ID: Richard [name], male   DOB: 02-19-1966, 50 y.o.   MRN: [id number] Patient ID: Richard [name], male   DOB: Mar 26, 1966, 50 y.o.   MRN: [id number] [facility] [id number] Progress Note Richard [name] MRN: [id number] DOB: [birth year] Age: 50 y.o.  Date: 09/03/2015  Chief Complaint  Patient presents with  . Depression  . Anxiety  . Follow-up   History of Chief Complaint:   This 50 year old Caucasian male who came for his followup appointment. He is currently living with his wife in [place]. He is unemployed and applying for disability.  The patient states that he has been employed for 5 years. 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 He got injured on the job and both shoulders were torn. He has not been able to work ever since and he feels very badly about this. 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  Since he's not been able to work the patient has been having increasingly depressed and anxious. He feels  like his body gives out when he is trying to exert himself even in a minor way. He's been in the ER recently for chest pain which was ruled out as a panic attack. He also saw cardiologist this week and will be having a stress test. His blood pressure was too low and his lisinopril was cut down. He is also trying to quit smoking by using electronic cigarettes.  The patient returns after 3 months. He states that he is doing that well. He recently had a cardiac catheterization and had a TIA right afterwards. Fortunately his MRI and MRA of the brain did not show anything. He's having cardiac palpitations every night but the cardiologist can't find an etiology for this. I still think it's primarily anxiety. I suggested he increase his Xanax a little bit at bedtime and he agrees. He has wife are still severely financially strapped. He did not get disability and they don't qualify for Medicaid. The only assistance he is getting his to [place] charity care. He seemed very stressed today but not suicidal. He agrees to come back here to see his counselor, Jenny Reichmann Rodenbaugh Depression        Associated symptoms include fatigue.  Past medical history includes anxiety.   Anxiety Presents  for follow-up visit. Symptoms include nervous/anxious behavior.     Review of Systems  Constitutional: Positive for fatigue.  Gastrointestinal: Negative.   Musculoskeletal: Positive for back pain.  Skin: Negative.   Psychiatric/Behavioral: Positive for depression, sleep disturbance and dysphoric mood. The patient is nervous/anxious.     Physical Exam Vitals: BP 134/92 mmHg  Pulse 60  Ht 5\' 11"  (1.803 m)  Wt 210 lb 12.8 oz (95.618 kg)  BMI 29.41 kg/m2  SpO2 97%   Past Psychiatric History: Diagnosis: none  Hospitalizations: none  Outpatient Care: none  Substance Abuse Care: none  Self-Mutilation: none  Suicidal Attempts: none  Violent Behaviors: none   Allergies: Allergies  Allergen Reactions  . Dexilant  [Dexlansoprazole] Anaphylaxis  . Mushroom Ext Cmplx(Shiitake-Reishi-Mait) Anaphylaxis    Rapid heart rate.  . Penicillins Anaphylaxis    Has patient had a PCN reaction causing immediate rash, facial/tongue/throat swelling, SOB or lightheadedness with hypotension: Yes Has patient had a PCN reaction causing severe rash involving mucus membranes or skin necrosis: No Has patient had a PCN reaction that required hospitalization Yes Has patient had a PCN reaction occurring within the last 10 years: No If all of the above answers are "NO", then may proceed with Cephalosporin use.    Medical History: Past Medical History  Diagnosis Date  . Chest pain     + palpitations; cath 2005- 30-40% mid LAD, 20% D1, 20% cx, OM, 20-30% RCA, and EF-55%  . COPD (chronic obstructive pulmonary disease) ([place])   . GERD (gastroesophageal reflux disease)   . Hyperlipemia   . Hypertension   . Depression   . Colitis 1990  . Tobacco abuse     1/2 pack per day  . Gastric ulcer 2003; 2012    2003: + esophagitis; negative H.pylori serology  2012: Dr. Oneida Alar, mild gastritis, Bravo PH probe placement, negative H.pylori  . Hepatic steatosis   . Chronic low back pain   . Panic attacks   . Asthma   . Pneumonia 2000s X 2  . Type II diabetes mellitus ([place])   . History of hiatal hernia   . Arthritis     "legs, spine, shoulders" (08/12/2015)   Surgical History: Past Surgical History  Procedure Laterality Date  . Colonoscopy  1990  . Shoulder arthroscopy w/ rotator cuff repair Right 2006    acromioclavicular joint arthrosis  . Neck mass excision Right     "done in dr's office; behind right ear/side of ncek"  . Bravo ph study  05/03/2011    [id number] gastritis/normal esophagus and duodenum  . Esophagogastroduodenoscopy  05/03/2011    [id number] gastritis  . Cardiac catheterization  1990s X 1; 2005; 08/12/2015  . Cardiac catheterization N/A 08/12/2015    Procedure: Left Heart Cath and Coronary Angiography;  Surgeon:  Belva Crome, MD; LAD 20%, CFX 65%, RCA 20%, EF 60%    Family History: family history includes ADD / ADHD in his daughter; Alcohol abuse in his father and mother; Anxiety disorder in his sister and sister; Dementia in his cousin and paternal uncle; Depression in his sister; Diabetes in his brother and father; Heart attack (age of onset: 28) in his brother; Heart attack (age of onset: 91) in his father; Hypertension in his brother, brother, and brother; Lung cancer in his mother; Seizures in his brother. There is no history of Colon cancer, Drug abuse, Bipolar disorder, OCD, Paranoid behavior, Schizophrenia, Sexual abuse, or Physical abuse. Reviewed again today and nothing new.   Current Medications:  Current  Outpatient Prescriptions  Medication Sig Dispense Refill  . acetaminophen (TYLENOL) 325 MG tablet Take 650 mg by mouth every 6 (six) hours as needed for pain or fever.     [name] albuterol (VENTOLIN HFA) 108 (90 BASE) MCG/ACT inhaler Inhale 2 puffs into the lungs every 6 (six) hours as needed for wheezing or shortness of breath.     . ALPRAZolam (XANAX) 0.5 MG tablet Take one three times a day and two at night 150 tablet 2  . aspirin 81 MG tablet Take 81 mg by mouth at bedtime.     . cyclobenzaprine (FLEXERIL) 10 MG tablet Take 1 tablet (10 mg total) by mouth 3 (three) times daily as needed for muscle spasms. 30 tablet 2  . esomeprazole (NEXIUM) 40 MG capsule Take 1 capsule (40 mg total) by mouth 2 (two) times daily before a meal. 180 capsule 3  . Fluticasone-Salmeterol (ADVAIR) 250-50 MCG/DOSE AEPB Inhale 1 puff into the lungs every 12 (twelve) hours.    [name] HYDROcodone-acetaminophen (NORCO/VICODIN) 5-325 MG per tablet Take 1 tablet by mouth every 6 (six) hours as needed for pain. 90 tablet 1  . ibuprofen (ADVIL,MOTRIN) 600 MG tablet Take 1 tablet (600 mg total) by mouth every 8 (eight) hours as needed for moderate pain. 30 tablet 1  . lovastatin (MEVACOR) 20 MG tablet TAKE ONE TABLET BY MOUTH ONCE  DAILY AT 6:OO PM 30 tablet 6  . metFORMIN (GLUCOPHAGE) 500 MG tablet Take 1 tablet (500 mg total) by mouth 2 (two) times daily with a meal. 30 tablet 2  . metoprolol succinate (TOPROL-XL) 50 MG 24 hr tablet Take 1 tablet (50 mg total) by mouth daily. Take with or immediately following a meal. 30 tablet 3  . nitroGLYCERIN (NITROSTAT) 0.4 MG SL tablet Place 1 tablet (0.4 mg total) under the tongue every 5 (five) minutes as needed for chest pain. 25 tablet 3   No current facility-administered medications for this visit.    Previous Psychotropic Medications: Medication Dose   Xanax     Substance Abuse History in the last 12 months: Substance Age of 1st Use Last Use Amount Specific Type  Nicotine  17  2 hours ago  1  cigarette  Alcohol  22  38      Cannabis  none        Opiates  36  today  2.5 mg  hyrdocodone  Cocaine  none        Methamphetamines  none        LSD  none        Ecstasy  none         Benzodiazepines  22  started in early thrities  0.5 mg  Xanax  Caffeine  childhood  this AM  1 cup  Mt Dew  Inhalants  none        Others:      Sugar  11  this AM  20 tsps  in the [place] Consequences of Substance Abuse: pain Legal Consequences of Substance Abuse: none Family Consequences of Substance Abuse: none Blackouts:  No DT's:  No Withdrawal Symptoms:  Yes Tremors  Social History: Patient lives with his life, daughter and her husband with 2 grandbabies.  He has 2 sons and the daughter.  He is a Programmer, systems.    Mental Status Examination/Evaluation: Objective:  Appearance: Casual  Eye Contact::  Good  Speech:  Clear and Coherent  Volume:  Normal  Mood:  Very anxious  Affect:  Congruent  Thought Process:  Coherent, Intact and Linear  Orientation:  Full (Time, Place, and Person)  Thought Content:  WDL  Suicidal Thoughts:  No  Homicidal Thoughts:  No  Judgement:  Good  Insight:  Fair  Psychomotor Activity:  Normal  Akathisia:  No  Handed:  Right  AIMS  (if indicated):    Assets:  Communication Skills Desire for Improvement   Lab Results:  Results for orders placed or performed during the hospital encounter of 08/12/15 (from the past 8736 hour(s))  Glucose, capillary   Collection Time: 08/12/15  6:14 AM  Result Value Ref Range   Glucose-Capillary 124 (H) 65 - 99 mg/dL  Protime-INR   Collection Time: 08/12/15  6:46 AM  Result Value Ref Range   Prothrombin Time 13.1 11.6 - 15.2 seconds   INR 0.97 0.00 - [id number]  Basic metabolic panel   Collection Time: 08/12/15  6:46 AM  Result Value Ref Range   Sodium 138 135 - 145 mmol/L   Potassium 4.2 3.5 - 5.1 mmol/L   Chloride 101 101 - 111 mmol/L   CO2 25 22 - 32 mmol/L   Glucose, Bld 137 (H) 65 - 99 mg/dL   BUN 12 6 - 20 mg/dL   Creatinine, Ser 0.89 0.61 - 1.24 mg/dL   Calcium 9.2 8.9 - 10.3 mg/dL   GFR calc non Af Amer >60 >60 mL/min   GFR calc Af Amer >60 >60 mL/min   Anion gap 12 5 - 15  CBC   Collection Time: 08/12/15  6:46 AM  Result Value Ref Range   WBC 6.6 4.0 - 10.5 K/uL   RBC 4.99 4.22 - 5.81 MIL/uL   Hemoglobin 15.7 13.0 - 17.0 g/dL   HCT 45.0 39.0 - 52.0 %   MCV 90.2 78.0 - 100.0 fL   MCH 31.5 26.0 - 34.0 pg   MCHC 34.9 30.0 - 36.0 g/dL   RDW 12.3 11.5 - 15.5 %   Platelets 232 150 - 400 K/uL  Glucose, capillary   Collection Time: 08/12/15  8:43 AM  Result Value Ref Range   Glucose-Capillary 128 (H) 65 - 99 mg/dL  Glucose, capillary   Collection Time: 08/12/15 12:17 PM  Result Value Ref Range   Glucose-Capillary 155 (H) 65 - 99 mg/dL  APTT   Collection Time: 08/12/15 12:36 PM  Result Value Ref Range   aPTT 30 24 - 37 seconds  Glucose, capillary   Collection Time: 08/12/15  5:58 PM  Result Value Ref Range   Glucose-Capillary 117 (H) 65 - 99 mg/dL  Glucose, capillary   Collection Time: 08/12/15 10:11 PM  Result Value Ref Range   Glucose-Capillary 129 (H) 65 - 99 mg/dL  Hemoglobin A1c   Collection Time: 08/13/15  5:10 AM  Result Value Ref Range   Hgb A1c MFr  Bld 7.8 (H) 4.8 - 5.6 %   Mean Plasma Glucose 177 mg/dL  Lipid panel   Collection Time: 08/13/15  5:10 AM  Result Value Ref Range   Cholesterol 119 0 - 200 mg/dL   Triglycerides 142 <150 mg/dL   HDL 27 (L) >40 mg/dL   Total CHOL/HDL Ratio 4.4 RATIO   VLDL 28 0 - 40 mg/dL   LDL Cholesterol 64 0 - 99 mg/dL  Glucose, capillary   Collection Time: 08/13/15  7:21 AM  Result Value Ref Range   Glucose-Capillary 104 (H) 65 - 99 mg/dL   Comment 1 Notify RN    Comment 2 Document  in Chart   Glucose, capillary   Collection Time: 08/13/15 11:17 AM  Result Value Ref Range   Glucose-Capillary 171 (H) 65 - 99 mg/dL  Glucose, capillary   Collection Time: 08/13/15  4:25 PM  Result Value Ref Range   Glucose-Capillary 124 (H) 65 - 99 mg/dL  Results for orders placed or performed in visit on 08/11/15 (from the past 8736 hour(s))  Hepatic function panel   Collection Time: 08/31/15  3:54 PM  Result Value Ref Range   Total Bilirubin 0.3 0.2 - 1.2 mg/dL   Bilirubin, Direct 0.1 <=0.2 mg/dL   Indirect Bilirubin 0.2 0.2 - 1.2 mg/dL   Alkaline Phosphatase 53 40 - 115 U/L   AST 17 10 - 35 U/L   ALT 25 9 - 46 U/L   Total Protein 6.8 6.1 - 8.1 g/dL   Albumin 4.3 3.6 - 5.1 g/dL  Results for orders placed or performed in visit on 08/03/15 (from the past 8736 hour(s))  Basic metabolic panel   Collection Time: 08/03/15 12:30 PM  Result Value Ref Range   Sodium 132 (L) 135 - 146 mmol/L   Potassium 4.6 3.5 - 5.3 mmol/L   Chloride 99 98 - 110 mmol/L   CO2 26 20 - 31 mmol/L   Glucose, Bld 179 (H) 65 - 99 mg/dL   BUN 15 7 - 25 mg/dL   Creat 0.85 0.70 - 1.33 mg/dL   Calcium 9.5 8.6 - 10.3 mg/dL  Results for orders placed or performed in visit on 05/05/15 (from the past 8736 hour(s))  Lipid panel   Collection Time: 05/05/15  8:47 AM  Result Value Ref Range   Cholesterol 146 125 - 200 mg/dL   Triglycerides 204 (H) <150 mg/dL   HDL 28 (L) >=40 mg/dL   Total CHOL/HDL Ratio 5.2 (H) <=5.0 Ratio   VLDL 41 (H)  <30 mg/dL   LDL Cholesterol 77 <130 mg/dL  Results for orders placed or performed during the hospital encounter of 05/05/15 (from the past 8736 hour(s))  Spirometry with Graph   Collection Time: 05/05/15  9:40 AM  Result Value Ref Range   FVC-Pre 4.67 L   FVC-%Pred-Pre 89 %   FEV1-Pre 3.47 L   FEV1-%Pred-Pre 85 %   FEV6-Pre 4.65 L   FEV6-%Pred-Pre 92 %   Pre FEV1/FVC ratio 74 %   FEV1FVC-%Pred-Pre 95 %   Pre FEV6/FVC Ratio 100 %   FEV6FVC-%Pred-Pre 102 %   FEF 25-75 Pre 2.60 L/sec   FEF2575-%Pred-Pre 73 %  Results for orders placed or performed in visit on 10/16/14 (from the past 8736 hour(s))  Basic Metabolic Panel (BMET)   Collection Time: 10/19/14  9:07 AM  Result Value Ref Range   Sodium 139 135 - 145 mEq/L   Potassium 4.4 3.5 - 5.3 mEq/L   Chloride 104 96 - 112 mEq/L   CO2 26 19 - 32 mEq/L   Glucose, Bld 159 (H) 70 - 99 mg/dL   BUN 15 6 - 23 mg/dL   Creat 0.93 0.50 - 1.35 mg/dL   Calcium 8.9 8.4 - 10.5 mg/dL  Results for orders placed or performed during the hospital encounter of 09/07/14 (from the past 8736 hour(s))  CBC with Differential/Platelet   Collection Time: 09/07/14 10:50 PM  Result Value Ref Range   WBC 5.8 4.0 - 10.5 K/uL   RBC 4.60 4.22 - 5.81 MIL/uL   Hemoglobin 14.3 13.0 - 17.0 g/dL   HCT 41.7 39.0 - 52.0 %   MCV 90.7  78.0 - 100.0 fL   MCH 31.1 26.0 - 34.0 pg   MCHC 34.3 30.0 - 36.0 g/dL   RDW 12.5 11.5 - 15.5 %   Platelets 184 150 - 400 K/uL   Neutrophils Relative % 48 43 - 77 %   Neutro Abs 2.9 1.7 - 7.7 K/uL   Lymphocytes Relative 41 12 - 46 %   Lymphs Abs 2.4 0.7 - 4.0 K/uL   Monocytes Relative 8 3 - 12 %   Monocytes Absolute 0.5 0.1 - 1.0 K/uL   Eosinophils Relative 3 0 - 5 %   Eosinophils Absolute 0.2 0.0 - 0.7 K/uL   Basophils Relative 0 0 - 1 %   Basophils Absolute 0.0 0.0 - 0.1 K/uL  Basic metabolic panel   Collection Time: 09/07/14 10:50 PM  Result Value Ref Range   Sodium 136 135 - 145 mmol/L   Potassium 3.3 (L) 3.5 - 5.1 mmol/L    Chloride 110 96 - 112 mmol/L   CO2 26 19 - 32 mmol/L   Glucose, Bld 179 (H) 70 - 99 mg/dL   BUN 16 6 - 23 mg/dL   Creatinine, Ser 0.98 0.50 - 1.35 mg/dL   Calcium 8.2 (L) 8.4 - 10.5 mg/dL   GFR calc non Af Amer >90 >90 mL/min   GFR calc Af Amer >90 >90 mL/min   Anion gap 0 (L) 5 - 15  Troponin I   Collection Time: 09/07/14 10:50 PM  Result Value Ref Range   Troponin I <0.03 <0.031 ng/mL  Troponin I   Collection Time: 09/08/14  1:32 AM  Result Value Ref Range   Troponin I <0.03 <0.031 ng/mL    Assessment:   AXIS I Generalized Anxiety Disorder, Major Depression, Recurrent severe and Panic Disorder  AXIS II Deferred  AXIS III Past Medical History  Diagnosis Date  . Chest pain     + palpitations; cath 2005- 30-40% mid LAD, 20% D1, 20% cx, OM, 20-30% RCA, and EF-55%  . COPD (chronic obstructive pulmonary disease) ([place])   . GERD (gastroesophageal reflux disease)   . Hyperlipemia   . Hypertension   . Depression   . Colitis 1990  . Tobacco abuse     1/2 pack per day  . Gastric ulcer 2003; 2012    2003: + esophagitis; negative H.pylori serology  2012: Dr. Oneida Alar, mild gastritis, Bravo PH probe placement, negative H.pylori  . Hepatic steatosis   . Chronic low back pain   . Panic attacks   . Asthma   . Pneumonia 2000s X 2  . Type II diabetes mellitus ([place])   . History of hiatal hernia   . Arthritis     "legs, spine, shoulders" (08/12/2015)     AXIS IV other psychosocial or environmental problems  AXIS V 41-50 serious symptoms   Treatment Plan/Recommendations: Laboratory:    Psychotherapy: supportive psychotherapy, he will reschedule with Jenny Reichmann Rodenbaugh   Medications:  He will continue  Xanaxbut increase the dose to 0.5 mg 3 times a day and 1 mg at bedtime   Routine PRN Medications:  No  Consultations: none  Safety Concerns:  none  Other:  Return in 2 months     MEDICATIONS this encounter: Meds ordered this encounter  Medications  . ALPRAZolam (XANAX) 0.5 MG  tablet    Sig: Take one three times a day and two at night    Dispense:  150 tablet    Refill:  2   Medical Decision Making Problem Points:  Established problem, stable/improving (1), Review of last therapy session (1) and Review of psycho-social stressors (1) Data Points:  Review or order clinical lab tests (1) Review of medication regiment & side effects (2) Review of new medications or change in dosage (2)  I certify that outpatient services furnished can reasonably be expected to improve the patient's condition.   [name], MD

## 2015-09-06 ENCOUNTER — Other Ambulatory Visit: Payer: Self-pay | Admitting: Licensed Clinical Social Worker

## 2015-09-06 NOTE — Patient Outreach (Signed)
Assessment:  CSW called client home phone number on 09/06/15 and spoke via phone with client on 09/06/15. CSW verified client identity. CSW and client spoke of client needs. Client said the last time he saw Dr. Harrington Challenger, psychiatrist, was for appointment last week.  He said that Dr. Harrington Challenger had prescribed that client begin taking two xanax (0.5 mg tab) each night at bedtime.  Client said he had been following this recommendation from Dr. Harrington Challenger but said he feels sleepy during morning hours.  He said he is eating well. He is sleeping adequately. He said he sees Anola Gurney, Librarian, academic, as scheduled at [facility] Department.  CSW and client spoke of client care plan.  CSW encouraged client to attend all scheduled medical/mental health appointments for client in the next 30 days.  CSW also encouraged client to continue using relaxation techniques in next 30 days to help client manage panic attacks and anxiety symptoms. Client said he has experienced panic attacks for 20 years. He feels that appointments with Dr. Harrington Challenger are very beneficial to him. CSW informed client that CSW will work with client for maximum of 30 days.  Client said he understood this information. CSW also recommended that client contact [employer] to see if that group could mail him an application for assistance with eye glasses. Client said he would call [employer] and speak with representative about mailing him an application for assistance with eye glasses. CSW thanked client for phone call with CSW on 09/06/15.   Plan: Client to attend all scheduled client medical appointments in next 30 days. Client to use relaxation techniques in next 30 days to help client manage panic attacks and anxiety symptoms. CSW to call client in two weeks to assess client needs.  Norva Riffle.Richell Corker MSW, LCSW Licensed Clinical Social Worker [facility] Care Management [phone]

## 2015-09-07 ENCOUNTER — Telehealth: Payer: Self-pay | Admitting: Cardiovascular Disease

## 2015-09-07 MED ORDER — LISINOPRIL 5 MG PO TABS: 5.0000 mg | ORAL_TABLET | Freq: Every day | ORAL | Status: DC

## 2015-09-07 NOTE — Progress Notes (Signed)
Quick Note:  Pt is aware and will keep appt. ______

## 2015-09-07 NOTE — Telephone Encounter (Signed)
Spoke to pt's wife and she stated that her husband has been having headaches and they have noticed his blood pressure is staying up lately. He has taken it at [facility] several times and it has been 145/95, 140/90 and 138/92. At his behavioral health visit they took it and it was 140/92. She is concerned about this and was wondering if maybe he should be put back on his lisinopril at a low dose to keep  his blood pressure lower. Please advise.[name]

## 2015-09-07 NOTE — Progress Notes (Signed)
Quick Note:  LFTs are normal. Keep ov with slf this week. Very important. ______

## 2015-09-07 NOTE — Telephone Encounter (Signed)
Patient's wife has concerns regarding BP readings / tg

## 2015-09-07 NOTE — Telephone Encounter (Signed)
He is to f/u with me prn. Regarding BP, it would be fine to start lisinopril 5 mg but further BP management can be addressed by PCP. May want to consider getting prescription from PCP for this reason.

## 2015-09-07 NOTE — Telephone Encounter (Signed)
I spoke with pt,he will f/u with pcp,will restart lisinopril 5 mg daily

## 2015-09-09 ENCOUNTER — Ambulatory Visit: Payer: Self-pay | Admitting: Gastroenterology

## 2015-09-09 ENCOUNTER — Ambulatory Visit (HOSPITAL_COMMUNITY): Payer: Self-pay | Admitting: Psychology

## 2015-09-09 ENCOUNTER — Ambulatory Visit (INDEPENDENT_AMBULATORY_CARE_PROVIDER_SITE_OTHER): Payer: Self-pay | Admitting: Psychology

## 2015-09-09 ENCOUNTER — Telehealth: Payer: Self-pay | Admitting: Gastroenterology

## 2015-09-09 ENCOUNTER — Telehealth: Payer: Self-pay | Admitting: Cardiovascular Disease

## 2015-09-09 DIAGNOSIS — F4001 Agoraphobia with panic disorder: Secondary | ICD-10-CM

## 2015-09-09 DIAGNOSIS — M545 Low back pain, unspecified: Secondary | ICD-10-CM

## 2015-09-09 DIAGNOSIS — F418 Other specified anxiety disorders: Secondary | ICD-10-CM

## 2015-09-09 DIAGNOSIS — F329 Major depressive disorder, single episode, unspecified: Secondary | ICD-10-CM

## 2015-09-09 DIAGNOSIS — G8929 Other chronic pain: Secondary | ICD-10-CM

## 2015-09-09 DIAGNOSIS — F419 Anxiety disorder, unspecified: Principal | ICD-10-CM

## 2015-09-09 NOTE — Telephone Encounter (Signed)
noted 

## 2015-09-09 NOTE — Telephone Encounter (Signed)
PLEASE CALL PATIENT, HE NEEDS SOME [employer] SAMPLES  [phone]

## 2015-09-09 NOTE — Telephone Encounter (Signed)
Pt's wife called to ask if they need to have pcp fill his scripts from now on since he is prn.I told her that is wise.She states they are getting a new blood pressure machine for home and is BP is "good"

## 2015-09-09 NOTE — Telephone Encounter (Signed)
Pt's wife is aware that I have left samples of Nexium OTC at front for pick up. I left 12 boxes of 2 . He rescheduled his OV til 10/14/2015.

## 2015-09-09 NOTE — Telephone Encounter (Signed)
Patient's wife has questions regarding medications. / tg

## 2015-09-10 ENCOUNTER — Other Ambulatory Visit: Payer: Self-pay | Admitting: *Deleted

## 2015-09-10 NOTE — Patient Outreach (Signed)
[place] [facility]) Care Management  09/10/2015  Richard [name] [birth year] [id number]  EMMI-Stroke follow up:  Telephone call to patient who voices that he has not had hospital readmission or emergency room visit since recent hospital stay for TIA. States he has not experienced any stroke symptoms. Understands importance of calling 911 if symptoms occur.  States some medication additions-Lisinopril was started and additional xanax to help sleep.  Plan: Update care plan as noted. Follow up next week. Patient agrees with set appointment. Sherrin Daisy, RN BSN [place] Management Coordinator [facility] Care Management  [phone]

## 2015-09-17 ENCOUNTER — Other Ambulatory Visit: Payer: Self-pay | Admitting: *Deleted

## 2015-09-17 NOTE — Patient Outreach (Signed)
[place] [facility]) Care Management  09/17/2015  Richard [name] 31-Jan-1966 [id number]   EMMI-Stroke follow up,  Telephone call to patient who voices that he has not had any hospital admissions or emergency room visits for stoke symptoms. States knows action plan if symptoms occur. Taking medications as instructed by MD and no changes in medications.   Keeping doctors appointments as scheduled. Patient is aware of stroke symptoms and action plan if they occur.  Plan: Will follow up. Update care plan as indicated.   Sherrin Daisy, RN BSN [place] Management Coordinator [facility] Care Management  [phone]

## 2015-09-20 ENCOUNTER — Encounter: Payer: Self-pay | Admitting: Licensed Clinical Social Worker

## 2015-09-20 ENCOUNTER — Other Ambulatory Visit: Payer: Self-pay | Admitting: Licensed Clinical Social Worker

## 2015-09-20 NOTE — Patient Outreach (Signed)
Assessment:  CSW called client on 09/20/15 and spoke via phone with client on 09/20/15. CSW verified client identity. CSW and client spoke of client needs. Client said he has his prescribed medications and is taking medications as prescribed.  He said he is attending scheduled medical and mental health appointments.  Client said he is still using relaxation techniques to help him manage panic episodes and stress symptoms.  CSW informed client that client had met his care plan goals with CSW services and thus because client has met care plan goals, CSW will discharge client on 09/20/15 from [place] services. Client agreed to this plan.  Client is still receiving telephonic nursing support with RN Sherrin Daisy at present. He said he has upcoming appointment with Anola Gurney, physician assistant, at [facility] Department. Client said he also has counseling appointment with Dr. Thora Lance next week.  CSW congratulated client on reaching client care plan goals with CSW services. CSW encouraged Richard [name] to call Sherrin Daisy, RN as needed related to nursing needs of client.  Plan: CSW is discharging Richard [name] from [place] services on 09/20/15 since client has met client care plan goals with CSW services. CSW to inform Richard [name] that Richard [name] discharged client from [facility] CSW services on 09/20/15. CSW to fax letter to physician assistant, Richard [name], informing her that Richard [name] discharged client from [place] services only on 09/20/15.   Richard [name].Richard [name] MSW, LCSW Licensed Clinical Social Worker [facility] Care Management [phone]       Plan:

## 2015-09-22 ENCOUNTER — Ambulatory Visit (HOSPITAL_COMMUNITY): Payer: Self-pay | Admitting: Psychiatry

## 2015-09-28 ENCOUNTER — Encounter (HOSPITAL_COMMUNITY): Payer: Self-pay | Admitting: Psychology

## 2015-09-28 NOTE — Progress Notes (Signed)
PROGRESS NOTE  Patient:  Richard [name]   DOB: Dec 13, 1965  MR Number: [id number]  Location: [facility] [address] [place] [address] Dept: [phone]  Start: 1 PM End: 2 PM  Provider/Observer:     Edgardo Roys PSYD  Chief Complaint:      Chief Complaint  Patient presents with  . Panic Attack  . Anxiety  . Depression    Reason For Service:    The patient was referred by Dr. Lattie Haw because of severe anxiety and foot appear to be panic attacks. The patient reports that he has been having a lot of anxiety and panic attacks. He has numerous medical issues including severe issues with diabetes, COPD, fatty liver disease and orthopedic issues. He also has had a long history of depression anxiety as well. Anxiety and depression have been going on for least 5 years and correlate with his health issues and financial difficulties. The patient relates this deterioration to an injury at work where he injured both of his shoulders while pulling a pallet. He had surgery on one of his shoulders initially and he experiences around his injury and surgery began to develop panic attacks. The patient is also been diagnosed with COPD and diabetes but continues to smoke and fears that if he completely quit smoking that he will gain a lot of weight again.  The patient returns reporting that he has continued to struggle with panic events as well as his significant medical issues and chronic pain.  He has been working on Radiographer, therapeutic very consistently and has reduced the frequency of his panic events and anxiety symptoms.   Interventions Strategy:  Cognitive/behavioral psychotherapeutic interventions  Participation Level:   Active  Participation Quality:  Appropriate      Behavioral Observation:  Fairly Groomed, Alert, and Appropriate.   Current Psychosocial Factors: The patient reports that he has  continued to struggle with significant financial issues and inability to work.  His chronic pain continues and he has had reduced but continued panic events.  Content of Session:   Review current symptoms and continued work on therapeutic interventions around numerous medical issues and panic attacks.  Current Status:   Patient reports that he had continued to have severe pain but reduced panic attacks. pulmonary issues persist.  Depression much worse due to denial of disability.  Patient Progress:   Stable  Target Goals:   Target goals include reduce the intensity, duration, and frequency of panic attacks, reduce the amount of benzodiazepine or other potentially dependence forming medications.  Last Reviewed:   09/09/2015  Goals Addressed Today:    Today we worked on issues of reducing the intensity and frequency of panic attacks  Impression/Diagnosis:   the patient appears to be doing with panic attacks along with anxiety and depression. He has numerous medical issues including COPD, diabetes, and other issues as well as liver disease. The patient is not taking very good care of himself and it was stressed specifically that he is going to need to quit smoking cigarettes and completely stopped caffeinated and sugar drinks if he is going to have any hope of improving his quality of life. The patient was amenable to initially try to stop the caffeinated drinks and we developed a plan for this. After that we will begin to address the smoking.    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

## 2015-10-04 ENCOUNTER — Telehealth (INDEPENDENT_AMBULATORY_CARE_PROVIDER_SITE_OTHER): Payer: Self-pay | Admitting: Internal Medicine

## 2015-10-04 NOTE — Telephone Encounter (Addendum)
I told Clarice that we do not see each others patients but patient insistent that he wants to be seen here.    Please look at his chart--he is currently being seen there and getting samples etc.  I am not sure why he thinks because he got financial assistance at 100% write off that he now wants to change physicians.  I really think he needs to stay there since they are doing work up on him and etc.  He is having palpitations and cardiac has said not cardiac related and Magda Paganini has told them this is not related to any GI issues he is having.  Please advise and I will call him.

## 2015-10-04 NOTE — Telephone Encounter (Signed)
[name] called saying [name] has received his letter for [facility] and now that he has, he'd like to become a patient of Dr. Laural Golden. She sated he used to see Dr. Laural Golden years ago and he's not satisfied with the care he's receiving at [facility]. He's being told his symptoms aren't GI related and he and his wife are sure they are. They'd like a phone call to see if they can see Dr. Laural Golden.  I informed them we don't see each other's patients but she wanted to ask Dr. Laural Golden if he'd make an exception.  Pt's ph# [phone] Thank you.

## 2015-10-04 NOTE — Telephone Encounter (Signed)
Noted  

## 2015-10-05 ENCOUNTER — Ambulatory Visit (HOSPITAL_COMMUNITY): Payer: Self-pay | Admitting: Psychology

## 2015-10-05 NOTE — Telephone Encounter (Signed)
Per Dr.Rehman the patient is to stay with [facility].

## 2015-10-05 NOTE — Telephone Encounter (Signed)
Per NUR he needs to stay at [facility] or maybe [facility].

## 2015-10-05 NOTE — Telephone Encounter (Signed)
I would advised patient continue follow at [facility] otherwise he will have to go to [facility].

## 2015-10-06 ENCOUNTER — Ambulatory Visit (HOSPITAL_COMMUNITY): Payer: Self-pay | Admitting: Psychiatry

## 2015-10-07 ENCOUNTER — Emergency Department (HOSPITAL_COMMUNITY): Payer: No Typology Code available for payment source

## 2015-10-07 ENCOUNTER — Encounter (HOSPITAL_COMMUNITY): Payer: Self-pay | Admitting: Emergency Medicine

## 2015-10-07 ENCOUNTER — Emergency Department (HOSPITAL_COMMUNITY)
Admission: EM | Admit: 2015-10-07 | Discharge: 2015-10-07 | Disposition: A | Discharge status: Home / Self Care | Payer: No Typology Code available for payment source | Attending: Emergency Medicine | Admitting: Emergency Medicine

## 2015-10-07 DIAGNOSIS — Z7982 Long term (current) use of aspirin: Secondary | ICD-10-CM | POA: Insufficient documentation

## 2015-10-07 DIAGNOSIS — Y9389 Activity, other specified: Secondary | ICD-10-CM | POA: Insufficient documentation

## 2015-10-07 DIAGNOSIS — F329 Major depressive disorder, single episode, unspecified: Secondary | ICD-10-CM | POA: Insufficient documentation

## 2015-10-07 DIAGNOSIS — Z79899 Other long term (current) drug therapy: Secondary | ICD-10-CM | POA: Insufficient documentation

## 2015-10-07 DIAGNOSIS — F17210 Nicotine dependence, cigarettes, uncomplicated: Secondary | ICD-10-CM | POA: Insufficient documentation

## 2015-10-07 DIAGNOSIS — J449 Chronic obstructive pulmonary disease, unspecified: Secondary | ICD-10-CM | POA: Diagnosis not present

## 2015-10-07 DIAGNOSIS — Y999 Unspecified external cause status: Secondary | ICD-10-CM | POA: Diagnosis not present

## 2015-10-07 DIAGNOSIS — Z791 Long term (current) use of non-steroidal anti-inflammatories (NSAID): Secondary | ICD-10-CM | POA: Insufficient documentation

## 2015-10-07 DIAGNOSIS — Y929 Unspecified place or not applicable: Secondary | ICD-10-CM | POA: Diagnosis not present

## 2015-10-07 DIAGNOSIS — J45909 Unspecified asthma, uncomplicated: Secondary | ICD-10-CM | POA: Diagnosis not present

## 2015-10-07 DIAGNOSIS — S161XXA Strain of muscle, fascia and tendon at neck level, initial encounter: Secondary | ICD-10-CM | POA: Diagnosis not present

## 2015-10-07 DIAGNOSIS — E785 Hyperlipidemia, unspecified: Secondary | ICD-10-CM | POA: Insufficient documentation

## 2015-10-07 DIAGNOSIS — S0003XA Contusion of scalp, initial encounter: Secondary | ICD-10-CM | POA: Diagnosis not present

## 2015-10-07 DIAGNOSIS — E119 Type 2 diabetes mellitus without complications: Secondary | ICD-10-CM | POA: Insufficient documentation

## 2015-10-07 DIAGNOSIS — M1990 Unspecified osteoarthritis, unspecified site: Secondary | ICD-10-CM | POA: Diagnosis not present

## 2015-10-07 DIAGNOSIS — I10 Essential (primary) hypertension: Secondary | ICD-10-CM | POA: Diagnosis not present

## 2015-10-07 DIAGNOSIS — S169XXA Unspecified injury of muscle, fascia and tendon at neck level, initial encounter: Secondary | ICD-10-CM | POA: Diagnosis present

## 2015-10-07 DIAGNOSIS — Z7984 Long term (current) use of oral hypoglycemic drugs: Secondary | ICD-10-CM | POA: Diagnosis not present

## 2015-10-07 MED ORDER — CYCLOBENZAPRINE HCL 10 MG PO TABS
10.0000 mg | ORAL_TABLET | Freq: Once | ORAL | Status: AC
  Administered 2015-10-07: 10 mg via ORAL
  Filled 2015-10-07: qty 1

## 2015-10-07 MED ORDER — HYDROCODONE-ACETAMINOPHEN 5-325 MG PO TABS
2.0000 | ORAL_TABLET | Freq: Once | ORAL | Status: AC
  Administered 2015-10-07: 2 via ORAL
  Filled 2015-10-07: qty 2

## 2015-10-07 MED ORDER — ONDANSETRON HCL 4 MG PO TABS
4.0000 mg | ORAL_TABLET | Freq: Once | ORAL | Status: AC
  Administered 2015-10-07: 4 mg via ORAL
  Filled 2015-10-07: qty 1

## 2015-10-07 MED ORDER — CYCLOBENZAPRINE HCL 10 MG PO TABS: 10.0000 mg | ORAL_TABLET | Freq: Three times a day (TID) | ORAL | Status: DC

## 2015-10-07 MED ORDER — IBUPROFEN 800 MG PO TABS
800.0000 mg | ORAL_TABLET | Freq: Once | ORAL | Status: AC
  Administered 2015-10-07: 800 mg via ORAL
  Filled 2015-10-07: qty 1

## 2015-10-07 MED ORDER — HYDROCODONE-ACETAMINOPHEN 5-325 MG PO TABS: 1.0000 | ORAL_TABLET | ORAL | Status: DC | PRN

## 2015-10-07 NOTE — Telephone Encounter (Signed)
Richard [name] called back and I gave her the information stated below. She'd like to know which location Richard [name] can go to in [place] so she can give them a call.   Pt's ph# [phone] Thank you.
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
## 2015-10-07 NOTE — ED Notes (Signed)
c-collar applied  

## 2015-10-07 NOTE — Telephone Encounter (Signed)
I called the patient to inform him of the information mentioned below but they don't have a voice mail set up. Thank you.
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
## 2015-10-11 ENCOUNTER — Telehealth: Payer: Self-pay | Admitting: Gastroenterology

## 2015-10-11 NOTE — Telephone Encounter (Signed)
Patient was called. Suggested the [facility] , and [facility]. Advised that a referral may be needed and that the PCP would handle that.

## 2015-10-11 NOTE — Telephone Encounter (Signed)
Pt's wife called to say patient has OV on Thursday and was wondering if they could get Nexium samples to hold him until Friday when they get paid. Please call Christy's cell [phone]

## 2015-10-11 NOTE — Telephone Encounter (Signed)
Nexium OTC # 5 boxes ( 10 tablets) at front for pick up. To take 2 tablets 30 min before evening meal.

## 2015-10-13 ENCOUNTER — Other Ambulatory Visit: Payer: Self-pay | Admitting: *Deleted
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
## 2015-10-14 ENCOUNTER — Encounter: Payer: Self-pay | Admitting: Gastroenterology

## 2015-10-14 ENCOUNTER — Ambulatory Visit (INDEPENDENT_AMBULATORY_CARE_PROVIDER_SITE_OTHER): Payer: Self-pay | Admitting: Gastroenterology

## 2015-10-14 VITALS — BP 134/82 | HR 88 | Temp 97.1°F | Ht 71.0 in | Wt 205.4 lb

## 2015-10-14 DIAGNOSIS — K760 Fatty (change of) liver, not elsewhere classified: Secondary | ICD-10-CM

## 2015-10-14 DIAGNOSIS — Z1211 Encounter for screening for malignant neoplasm of colon: Secondary | ICD-10-CM

## 2015-10-14 DIAGNOSIS — K219 Gastro-esophageal reflux disease without esophagitis: Secondary | ICD-10-CM

## 2015-10-14 NOTE — Progress Notes (Signed)
ON RECALL  °

## 2015-10-14 NOTE — Assessment & Plan Note (Signed)
WEIGHT STABLE, BMI > 25  LOSE WEIGHT  LOW FAT DIET FOLLOW UP IN 6 MOS.

## 2015-10-14 NOTE — Patient Instructions (Signed)
CONTINUE YOUR WEIGHT LOSS EFFORTS. LOSE TEN POUNDS.  DRINK WATER TO KEEP YOUR URINE LIGHT YELLOW.  FOLLOW A LOW FAT DIET. SEE INFO BELOW.  CONTINUE NEXIUM. TAKE 30 MINUTES BEFORE MEALS.  AVOID CAFFEINE AND SEE IF IT HELPS WITH YOUR CHEST FLUTTERING.  YOU NEED TO COMPLETE A COLONOSCOPY IN 2017.  FOLLOW UP IN 6 MOS.   Low-Fat Diet BREADS, CEREALS, PASTA, RICE, DRIED PEAS, AND BEANS These products are high in carbohydrates and most are low in fat. Therefore, they can be increased in the diet as substitutes for fatty foods. They too, however, contain calories and should not be eaten in excess. Cereals can be eaten for snacks as well as for breakfast.   FRUITS AND VEGETABLES It is good to eat fruits and vegetables. Besides being sources of fiber, both are rich in vitamins and some minerals. They help you get the daily allowances of these nutrients. Fruits and vegetables can be used for snacks and desserts.  MEATS Limit lean meat, chicken, [place], and fish to no more than 6 ounces per day. Beef, Pork, and Lamb Use lean cuts of beef, pork, and lamb. Lean cuts include:  Extra-lean ground beef.  Arm roast.  Sirloin tip.  Center-cut ham.  Round steak.  Loin chops.  Rump roast.  Tenderloin.  Trim all fat off the outside of meats before cooking. It is not necessary to severely decrease the intake of red meat, but lean choices should be made. Lean meat is rich in protein and contains a highly absorbable form of iron. Premenopausal women, in particular, should avoid reducing lean red meat because this could increase the risk for low red blood cells (iron-deficiency anemia).  Chicken and [place] These are good sources of protein. The fat of poultry can be reduced by removing the skin and underlying fat layers before cooking. Chicken and [place] can be substituted for lean red meat in the diet. Poultry should not be fried or covered with high-fat sauces. Fish and Shellfish Fish is a good source  of protein. Shellfish contain cholesterol, but they usually are low in saturated fatty acids. The preparation of fish is important. Like chicken and [place], they should not be fried or covered with high-fat sauces. EGGS Egg whites contain no fat or cholesterol. They can be eaten often. Try 1 to 2 egg whites instead of whole eggs in recipes or use egg substitutes that do not contain yolk. MILK AND DAIRY PRODUCTS Use skim or 1% milk instead of 2% or whole milk. Decrease whole milk, natural, and processed cheeses. Use nonfat or low-fat (2%) cottage cheese or low-fat cheeses made from vegetable oils. Choose nonfat or low-fat (1 to 2%) yogurt. Experiment with evaporated skim milk in recipes that call for heavy cream. Substitute low-fat yogurt or low-fat cottage cheese for sour cream in dips and salad dressings. Have at least 2 servings of low-fat dairy products, such as 2 glasses of skim (or 1%) milk each day to help get your daily calcium intake. FATS AND OILS Reduce the total intake of fats, especially saturated fat. Butterfat, lard, and beef fats are high in saturated fat and cholesterol. These should be avoided as much as possible. Vegetable fats do not contain cholesterol, but certain vegetable fats, such as coconut oil, palm oil, and palm kernel oil are very high in saturated fats. These should be limited. These fats are often used in bakery goods, processed foods, popcorn, oils, and nondairy creamers. Vegetable shortenings and some peanut butters contain hydrogenated oils,  which are also saturated fats. Read the labels on these foods and check for saturated vegetable oils. Unsaturated vegetable oils and fats do not raise blood cholesterol. However, they should be limited because they are fats and are high in calories. Total fat should still be limited to 30% of your daily caloric intake. Desirable liquid vegetable oils are corn oil, cottonseed oil, olive oil, canola oil, safflower oil, soybean oil, and  sunflower oil. Peanut oil is not as good, but small amounts are acceptable. Buy a heart-healthy tub margarine that has no partially hydrogenated oils in the ingredients. Mayonnaise and salad dressings often are made from unsaturated fats, but they should also be limited because of their high calorie and fat content. Seeds, nuts, peanut butter, olives, and avocados are high in fat, but the fat is mainly the unsaturated type. These foods should be limited mainly to avoid excess calories and fat. OTHER EATING TIPS Snacks  Most sweets should be limited as snacks. They tend to be rich in calories and fats, and their caloric content outweighs their nutritional value. Some good choices in snacks are graham crackers, melba toast, soda crackers, bagels (no egg), English muffins, fruits, and vegetables. These snacks are preferable to snack crackers, [place] fries, TORTILLA CHIPS, and POTATO chips. Popcorn should be air-popped or cooked in small amounts of liquid vegetable oil. Desserts Eat fruit, low-fat yogurt, and fruit ices instead of pastries, cake, and cookies. Sherbet, angel food cake, gelatin dessert, frozen low-fat yogurt, or other frozen products that do not contain saturated fat (pure fruit juice bars, frozen ice pops) are also acceptable.  COOKING METHODS Choose those methods that use little or no fat. They include: Poaching.  Braising.  Steaming.  Grilling.  Baking.  Stir-frying.  Broiling.  Microwaving.  Foods can be cooked in a nonstick pan without added fat, or use a nonfat cooking spray in regular cookware. Limit fried foods and avoid frying in saturated fat. Add moisture to lean meats by using water, broth, cooking wines, and other nonfat or low-fat sauces along with the cooking methods mentioned above. Soups and stews should be chilled after cooking. The fat that forms on top after a few hours in the refrigerator should be skimmed off. When preparing meals, avoid using excess salt. Salt can  contribute to raising blood pressure in some people.  EATING AWAY FROM HOME Order entres, potatoes, and vegetables without sauces or butter. When meat exceeds the size of a deck of cards (3 to 4 ounces), the rest can be taken home for another meal. Choose vegetable or fruit salads and ask for low-calorie salad dressings to be served on the side. Use dressings sparingly. Limit high-fat toppings, such as bacon, crumbled eggs, cheese, sunflower seeds, and olives. Ask for heart-healthy tub margarine instead of butter.

## 2015-10-14 NOTE — Progress Notes (Signed)
Subjective:    Patient ID: Richard [name], male    DOB: March 07, 1966, 50 y.o.   MRN: [id number]  MUSE,ROCHELLE D., PA-C  HPI HAVING A FLUTTERING IN HIS CHEST. NO DYSPHAGIA OR HEARTBURN. BEEN HAVING FLUTTERING IN CHEST: >1-2 YEARS. GETS SEVERE AND FEELS LIKE HE NEEDS TO GO TO THE ED. TRIGGERS: ? CAFFEINE, GREASY FOOD, SPAGHETTI. APPETITE: TOO GOOD. WEIGHT LOSS: FLUCTUATES(205-207 LBS). BMs: DOING PRETTY GOOD, Q2-3 AMs(#3-5). QUIT DRINKING MT DEW. [place], SX WENT AWAY. NOT SMOKING AS MUCH ANYMORE.  PT DENIES FEVER, CHILLS, HEMATOCHEZIA, nausea, vomiting, melena, diarrhea, CHEST PAIN, SHORTNESS OF BREATH, CHANGE IN BOWEL IN HABITS, constipation, abdominal pain, problems swallowing, OR heartburn or indigestion.  Past Medical History  Diagnosis Date  . Chest pain     + palpitations; cath 2005- 30-40% mid LAD, 20% D1, 20% cx, OM, 20-30% RCA, and EF-55%  . COPD (chronic obstructive pulmonary disease) ([place])   . GERD (gastroesophageal reflux disease)   . Hyperlipemia   . Hypertension   . Depression   . Colitis 1990  . Tobacco abuse     1/2 pack per day  . Gastric ulcer 2003; 2012    2003: + esophagitis; negative H.pylori serology  2012: Dr. Oneida Alar, mild gastritis, Bravo PH probe placement, negative H.pylori  . Hepatic steatosis   . Chronic low back pain   . Panic attacks   . Asthma   . Pneumonia 2000s X 2  . Type II diabetes mellitus ([place])   . History of hiatal hernia   . Arthritis     "legs, spine, shoulders" (08/12/2015)    Past Surgical History  Procedure Laterality Date  . Colonoscopy  1990  . Shoulder arthroscopy w/ rotator cuff repair Right 2006    acromioclavicular joint arthrosis  . Neck mass excision Right     "done in dr's office; behind right ear/side of ncek"  . Bravo ph study  05/03/2011    [id number] gastritis/normal esophagus and duodenum  . Esophagogastroduodenoscopy  05/03/2011    [id number] gastritis  . Cardiac catheterization  1990s X 1; 2005;  08/12/2015  . Cardiac catheterization N/A 08/12/2015    Procedure: Left Heart Cath and Coronary Angiography;  Surgeon: Belva Crome, MD; LAD 20%, CFX 65%, RCA 20%, EF 60%     Allergies  Allergen Reactions  . Dexilant [Dexlansoprazole] Anaphylaxis  . Mushroom Ext Cmplx(Shiitake-Reishi-Mait) Anaphylaxis    Rapid heart rate.  . Penicillins Anaphylaxis       Current Outpatient Prescriptions  Medication Sig Dispense Refill  . acetaminophen (TYLENOL) 325 MG tablet Take 650 mg by mouth every 6 (six) hours as needed for pain or fever.     [name] albuterol (VENTOLIN HFA) 108 (90 BASE) MCG/ACT inhaler Inhale 2 puffs into the lungs every 6 (six) hours as needed for wheezing or shortness of breath.     . ALPRAZolam (XANAX) 0.5 MG tablet Take one three times a day and two at night (Patient taking differently: Take 0.5-1 mg by mouth 4 (four) times daily. Take one three times a day and two at night)    . aspirin 81 MG tablet Take 81 mg by mouth at bedtime.     . calcium carbonate 500 MG  tablet Chew 1 tablet by mouth daily as needed for indigestion or heartburn.      Take 1 tablet (10 mg total) by mouth 3 (three) times daily.    [name] NEXIUM) 40 MG capsule Take 1 capsule BID before a  meal.    . ADVAIR  Inhale 1 puff into the lungs every 12 (twelve) hours.    .  (NORCO/VICODIN) 5-325 MG tablet Take 1 tablet by mouth every 4 (four) hours as needed.    [name] lisinopril 5 MG tablet Take 1 tablet (5 mg total) by mouth daily.    [name] lovastatin (MEVACOR) 20 MG tablet TAKE ONE TABLET BY MOUTH ONCE DAILY AT 6:OO PM    . GLUCOPHAGE 500 MG tablet Take 1 tablet  2 (two) times daily with a meal.     TOPROL Take 1 tablet (50 mg total) by mouth daily.     [name] NITROSTAT 0.4 MG SL tablet Place 1 tablet SL every 5 (five) minutes as needed for chest pain.     Review of Systems PER HPI OTHERWISE ALL SYSTEMS ARE NEGATIVE.    Objective:   Physical Exam  Constitutional: He is oriented to person, place, and time. He appears  well-developed and well-nourished. No distress.  HENT:  Head: Normocephalic and atraumatic.  Mouth/Throat: Oropharynx is clear and moist. No oropharyngeal exudate.  Eyes: Pupils are equal, round, and reactive to light. No scleral icterus.  Neck: Normal range of motion. Neck supple.  Cardiovascular: Normal rate, regular rhythm and normal heart sounds.   Pulmonary/Chest: Effort normal and breath sounds normal. No respiratory distress.  Abdominal: Soft. Bowel sounds are normal. He exhibits no distension. There is tenderness. There is no rebound and no guarding.  MILD TTP IN THE EPIGASTRIUM & LLQ.  Musculoskeletal: He exhibits no edema.  Lymphadenopathy:    He has no cervical adenopathy.  Neurological: He is alert and oriented to person, place, and time.  NO FOCAL DEFICITS  Psychiatric: He has a normal mood and affect.  Vitals reviewed.     Assessment & Plan:

## 2015-10-14 NOTE — Progress Notes (Signed)
cc'ed to pcp °

## 2015-10-14 NOTE — Assessment & Plan Note (Signed)
SYMPTOMS FAIRLY WELL CONTROLLED on Nexium bid.  Avoid triggers LOSE WEIGHT Low fat diet CONTINUE NEXIUM. TAKE 30 MINUTES BEFORE MEALS BID. FOLLOW UP IN 6 MOS.

## 2015-10-14 NOTE — Assessment & Plan Note (Signed)
AVERAGE RISK  TCS IN 2017

## 2015-10-22 ENCOUNTER — Other Ambulatory Visit: Payer: Self-pay | Admitting: Cardiovascular Disease

## 2015-11-02 ENCOUNTER — Ambulatory Visit (HOSPITAL_COMMUNITY): Payer: Self-pay | Admitting: Psychiatry

## 2015-11-05 ENCOUNTER — Ambulatory Visit (HOSPITAL_COMMUNITY): Payer: Self-pay | Admitting: Psychology

## 2015-11-16 ENCOUNTER — Ambulatory Visit (HOSPITAL_COMMUNITY): Payer: Self-pay | Admitting: Psychiatry

## 2015-11-17 ENCOUNTER — Encounter: Payer: Self-pay | Admitting: Internal Medicine

## 2015-11-17 ENCOUNTER — Ambulatory Visit (INDEPENDENT_AMBULATORY_CARE_PROVIDER_SITE_OTHER): Payer: Self-pay | Admitting: Internal Medicine

## 2015-11-17 VITALS — BP 130/80 | HR 72 | Ht 71.0 in | Wt 207.6 lb

## 2015-11-17 DIAGNOSIS — I10 Essential (primary) hypertension: Secondary | ICD-10-CM

## 2015-11-17 DIAGNOSIS — R0600 Dyspnea, unspecified: Secondary | ICD-10-CM | POA: Insufficient documentation

## 2015-11-17 DIAGNOSIS — Z720 Tobacco use: Secondary | ICD-10-CM

## 2015-11-17 DIAGNOSIS — F17210 Nicotine dependence, cigarettes, uncomplicated: Secondary | ICD-10-CM

## 2015-11-17 DIAGNOSIS — R0609 Other forms of dyspnea: Secondary | ICD-10-CM | POA: Insufficient documentation

## 2015-11-17 MED ORDER — BUDESONIDE-FORMOTEROL FUMARATE 80-4.5 MCG/ACT IN AERO: INHALATION_SPRAY | RESPIRATORY_TRACT | Status: DC

## 2015-11-17 MED ORDER — VALSARTAN 80 MG PO TABS
80.0000 mg | ORAL_TABLET | Freq: Every day | ORAL | Status: DC
Start: 2015-11-17 — End: 2015-11-19

## 2015-11-17 NOTE — Assessment & Plan Note (Signed)
>   3 min discussion I reviewed the Fletcher curve with the patient that basically indicates  if you quit smoking when your best day FEV1 is still well preserved (as is clearly  the case here)  it is highly unlikely you will progress to severe disease and informed the patient there was no medication on the market that has proven to alter the curve/ its downward trajectory  or the likelihood of progression of their disease.  Therefore stopping smoking and maintaining abstinence is the most important aspect of care, not choice of inhalers or for that matter, doctors  

## 2015-11-17 NOTE — Patient Instructions (Addendum)
Plan A = Automatic = Symbicort 80 Take 2 puffs first thing in am and then another 2 puffs about 12 hours later.     Plan B = Backup Only use your albuterol(ventolin)  as a rescue medication to be used if you can't catch your breath by resting or doing a relaxed purse lip breathing pattern.  - The less you use it, the better it will work when you need it. - Ok to use the inhaler up to 2 puffs  every 4 hours if you must but call for appointment if use goes up over your usual need - Don't leave home without it !!  (think of it like the spare tire for your car)   Stop lisinopril and start valsartan 80 mg one daily in its place    nexium Take 30- 60 min before your first and last meals of the day   GERD (REFLUX)  is an extremely common cause of respiratory symptoms just like yours , many times with no obvious heartburn at all.    It can be treated with medication, but also with lifestyle changes including elevation of the head of your bed (ideally with 6 inch  bed blocks),  Smoking cessation, avoidance of late meals, excessive alcohol, and avoid fatty foods, chocolate, peppermint, colas, red wine, and acidic juices such as orange juice.  NO MINT OR MENTHOL PRODUCTS SO NO COUGH DROPS  USE SUGARLESS CANDY INSTEAD (Jolley ranchers or Stover's or Life Savers) or even ice chips will also do - the key is to swallow to prevent all throat clearing. NO OIL BASED VITAMINS - use powdered substitutes.  Please schedule a follow up office visit in 4 weeks, sooner if needed

## 2015-11-17 NOTE — Assessment & Plan Note (Signed)
In the best review of chronic cough to date ( NEJM 2016 375 [phone]) ,  ACEi are now felt to cause cough in up to  20% of pts which is a 4 fold increase from previous reports and does not include the variety of non-specific complaints we see in pulmonary clinic in pts on ACEi but previously attributed to another dx like  Copd/asthma and  include PNDS, throat and chest congestion, "bronchitis", unexplained dyspnea and noct "strangling" sensations, and hoarseness, but also  atypical /refractory GERD symptoms like dysphagia and "bad heartburn"   The only way I know  to prove this is not an "ACEi Case" is a trial off ACEi x a minimum of 6 weeks then regroup.   Try diovan 80 mg daily in place of lisinopril

## 2015-11-17 NOTE — Progress Notes (Signed)
   Subjective:    Patient ID: Richard [name], male    DOB: 05-29-1966,    MRN: [id number]  HPI  1 yowm active smoker with onset of doe around 2011 much worse x 2015-16 to point where has trouble to walking to mailbox x 141ft slt uphill so referred to pulmonary clinic 11/17/2015 by Dr Darleene Cleaver.  11/17/2015 1st [facility] office visit/ Dalicia Kisner  On advair 250 bid  Chief Complaint  Patient presents with  . pulmonary consult    pt ref by dr. Darleene Cleaver for SOB. dry cough occ prod, wheezing mainly @ night, occ cp.   indolent onset doe x 6 years worse with certain smells and some better on advair/ventolin as long as avoids exertion but  wheezing every night x one year disturbs sleep On nexium bid but not ac  No obvious other patterns in day to day or daytime variabilty or assoc excess/ purulent sputum or mucus plugs  or cp or chest tightness, subjective wheeze overt sinus or hb symptoms. No unusual exp hx or h/o childhood pna/ asthma or knowledge of premature birth.  Sleeping ok without nocturnal  or early am exacerbation  of respiratory  c/o's or need for noct saba. Also denies any obvious fluctuation of symptoms with weather or environmental changes or other aggravating or alleviating factors except as outlined above   Current Medications, Allergies, Complete Past Medical History, Past Surgical History, Family History, and Social History were reviewed in [employer] record.             Review of Systems  Constitutional: Positive for unexpected weight change. Negative for fever.  HENT: Positive for dental problem and sneezing. Negative for congestion, ear pain, nosebleeds, postnasal drip, rhinorrhea, sinus pressure, sore throat and trouble swallowing.   Eyes: Negative for redness and itching.  Respiratory: Positive for cough, shortness of breath and wheezing. Negative for chest tightness.   Cardiovascular: Positive for palpitations. Negative for leg swelling.    Gastrointestinal: Negative for nausea and vomiting.  Genitourinary: Negative for dysuria.  Musculoskeletal: Negative for joint swelling.  Skin: Negative for rash.  Neurological: Positive for headaches.  Hematological: Bruises/bleeds easily.  Psychiatric/Behavioral: Negative for dysphoric mood. The patient is nervous/anxious.        Objective:   Physical Exam  amb wm nad  Wt Readings from Last 3 Encounters:  11/17/15 207 lb 9.6 oz (94.167 kg)  10/14/15 205 lb 6.4 oz (93.169 kg)  10/07/15 204 lb (92.534 kg)    Vital signs reviewed  HEENT: nl dentition, turbinates, and oropharynx. Nl external ear canals without cough reflex   NECK :  without JVD/Nodes/TM/ nl carotid upstrokes bilaterally   LUNGS: no acc muscle use,  Nl contour chest which is clear to A and P bilaterally without cough on insp or exp maneuvers   CV:  RRR  no s3 or murmur or increase in P2, no edema   ABD:  soft and nontender with nl inspiratory excursion in the supine position. No bruits or organomegaly, bowel sounds nl  MS:  Nl gait/ ext warm without deformities, calf tenderness, cyanosis or clubbing No obvious joint restrictions   SKIN: warm and dry without lesions    NEURO:  alert, approp, nl sensorium with  no motor deficits    I personally reviewed images and agree with radiology impression as follows:  pCXR:   08/12/15  No active disease.           Assessment & Plan:

## 2015-11-17 NOTE — Assessment & Plan Note (Signed)
Spirometry 05/05/15   FEV1 3.47 (85%)  Ratio 74  - 11/17/2015  After extensive coaching HFA effectiveness =    75% > try symb 80 2bid and stop advair  - Try off advair 11/17/2015 >>>   DDX of  difficult airways management almost all start with A and  include Adherence, Ace Inhibitors, Acid Reflux, Active Sinus Disease, Alpha 1 Antitripsin deficiency, Anxiety masquerading as Airways dz,  ABPA,  Allergy(esp in young), Aspiration (esp in elderly), Adverse effects of meds,  Active smokers, A bunch of PE's (a small clot burden can't cause this syndrome unless there is already severe underlying pulm or vascular dz with poor reserve) plus two Bs  = Bronchiectasis and Beta blocker use..and one C= CHF  Adherence is always the initial "prime suspect" and is a multilayered concern that requires a "trust but verify" approach in every patient - starting with knowing how to use medications, especially inhalers, correctly, keeping up with refills and understanding the fundamental difference between maintenance and prns vs those medications only taken for a very short course and then stopped and not refilled.  - The proper method of use, as well as anticipated side effects, of a metered-dose inhaler are discussed and demonstrated to the patient. Improved effectiveness after extensive coaching during this visit to a level of approximately 75 % from a baseline of 50 %   Active smoking (see separate a/p)    ? Adverse effect of advair > d/c  ? acei case (see separate a/p)   ? Acid (or non-acid) GERD > always difficult to exclude as up to 75% of pts in some series report no assoc GI/ Heartburn symptoms> rec continue max (24h)  acid suppression and diet restrictions/ reviewed     ? Anxiety > dx of exclusion   ? Allergy/asthma > symb 80 2bid should cover this  ? BB effect > doubt s more evidence of airflow obst on exam on spirometery esp with relatively low dose toprol  Total time devoted to counseling  = 35/107m review  case with pt/ discussion of options/alternatives/ personally creating in presence of pt  then going over specific  Instructions directly with the pt including how to use all of the meds but in particular covering each new medication in detail (see avs)

## 2015-11-18 ENCOUNTER — Telehealth: Payer: Self-pay | Admitting: Internal Medicine

## 2015-11-18 NOTE — Telephone Encounter (Signed)
Spoke with pt's wife. They had a question about how to use his Symbicort that was given to him yesterday. Her questions were answered. Nothing further was needed.

## 2015-11-19 ENCOUNTER — Telehealth: Payer: Self-pay | Admitting: Internal Medicine

## 2015-11-19 MED ORDER — LOSARTAN POTASSIUM 50 MG PO TABS: 50.0000 mg | ORAL_TABLET | Freq: Every day | ORAL | Status: DC

## 2015-11-19 NOTE — Telephone Encounter (Signed)
Spoke with pt's wife. States that pt's BP med was changed yesterday to Valsartan. This is going to be too expensive for them >> $126. They will need an alternative.  MW - please advise. Thanks.

## 2015-11-19 NOTE — Telephone Encounter (Signed)
ATC, n/a and no vm.

## 2015-11-19 NOTE — Telephone Encounter (Signed)
ATC wife, n/a and no vm. Spoke with pt and advised of rx change. Pt will call if this med still too expensive. Rx sent. Nothing further needed.

## 2015-11-19 NOTE — Telephone Encounter (Signed)
Try losartan 50 mg daily. 

## 2015-11-23 ENCOUNTER — Ambulatory Visit (HOSPITAL_COMMUNITY): Payer: Self-pay | Admitting: Psychology

## 2015-11-24 ENCOUNTER — Telehealth: Payer: Self-pay | Admitting: Internal Medicine

## 2015-11-24 MED ORDER — AMLODIPINE BESYLATE 5 MG PO TABS: 5.0000 mg | ORAL_TABLET | Freq: Every day | ORAL | Status: DC

## 2015-11-24 NOTE — Telephone Encounter (Signed)
Patient was placed on Diovan, it was too expensive, so was switched to Losartan.  Received fax from [facility] advising that:  The patient does not have insurance, a month supply of Valsartan is $122.  I priced out Losartan, and depending on Strength its between $40 and $60 per month which is still too much per the wife.  If Amlodipine is an option, the 10mg  #30 is approx. $25 per month which she said they could handle.  Shirlean Mylar  Dr. Melvyn Novas, please advise.  Patient Instructions     Plan A = Automatic = Symbicort 80 Take 2 puffs first thing in am and then another 2 puffs about 12 hours later.    Plan B = Backup Only use your albuterol(ventolin) as a rescue medication to be used if you can't catch your breath by resting or doing a relaxed purse lip breathing pattern.  - The less you use it, the better it will work when you need it. - Ok to use the inhaler up to 2 puffs every 4 hours if you must but call for appointment if use goes up over your usual need - Don't leave home without it !! (think of it like the spare tire for your car)   Stop lisinopril and start valsartan 80 mg one daily in its place   nexium Take 30- 60 min before your first and last meals of the day   GERD (REFLUX) is an extremely common cause of respiratory symptoms just like yours , many times with no obvious heartburn at all.   It can be treated with medication, but also with lifestyle changes including elevation of the head of your bed (ideally with 6 inch bed blocks), Smoking cessation, avoidance of late meals, excessive alcohol, and avoid fatty foods, chocolate, peppermint, colas, red wine, and acidic juices such as orange juice.  NO MINT OR MENTHOL PRODUCTS SO NO COUGH DROPS  USE SUGARLESS CANDY INSTEAD (Jolley ranchers or Stover's or Life Savers) or even ice chips will also do - the key is to swallow to prevent all throat clearing. NO OIL BASED VITAMINS - use powdered substitutes.  Please schedule a follow  up office visit in 4 weeks, sooner if needed

## 2015-11-24 NOTE — Telephone Encounter (Signed)
Rx has been sent in per MW. Nothing further was needed.

## 2015-11-24 NOTE — Telephone Encounter (Signed)
That's fine change to amlodipine 5 mg daily

## 2015-12-01 ENCOUNTER — Ambulatory Visit (INDEPENDENT_AMBULATORY_CARE_PROVIDER_SITE_OTHER): Payer: Self-pay | Admitting: Psychiatry

## 2015-12-01 ENCOUNTER — Encounter (HOSPITAL_COMMUNITY): Payer: Self-pay | Admitting: Psychiatry

## 2015-12-01 VITALS — BP 116/73 | HR 56 | Ht 71.0 in | Wt 208.4 lb

## 2015-12-01 DIAGNOSIS — F332 Major depressive disorder, recurrent severe without psychotic features: Secondary | ICD-10-CM

## 2015-12-01 DIAGNOSIS — F410 Panic disorder [episodic paroxysmal anxiety] without agoraphobia: Secondary | ICD-10-CM

## 2015-12-01 DIAGNOSIS — F419 Anxiety disorder, unspecified: Secondary | ICD-10-CM

## 2015-12-01 DIAGNOSIS — M545 Low back pain, unspecified: Secondary | ICD-10-CM

## 2015-12-01 DIAGNOSIS — F17200 Nicotine dependence, unspecified, uncomplicated: Secondary | ICD-10-CM

## 2015-12-01 DIAGNOSIS — F32A Depression, unspecified: Secondary | ICD-10-CM

## 2015-12-01 DIAGNOSIS — F329 Major depressive disorder, single episode, unspecified: Secondary | ICD-10-CM

## 2015-12-01 DIAGNOSIS — F411 Generalized anxiety disorder: Secondary | ICD-10-CM

## 2015-12-01 DIAGNOSIS — G8929 Other chronic pain: Secondary | ICD-10-CM

## 2015-12-01 DIAGNOSIS — F4001 Agoraphobia with panic disorder: Secondary | ICD-10-CM

## 2015-12-01 MED ORDER — ALPRAZOLAM 0.5 MG PO TABS: ORAL_TABLET | ORAL | Status: DC

## 2015-12-01 MED ORDER — PAROXETINE HCL 20 MG PO TABS: 20.0000 mg | ORAL_TABLET | Freq: Every day | ORAL | Status: DC

## 2015-12-01 NOTE — Progress Notes (Signed)
Patient ID: KEAGON HOFMANN, male   DOB: 1966/04/08, 50 y.o.   MRN: [id number] Patient ID: NICHOLLAS TESTER, male   DOB: [birth year], 50 y.o.   MRN: [id number] Patient ID: GERBER SOLANKI, male   DOB: [birth year], 50 y.o.   MRN: [id number] Patient ID: BRUNSON CIRIACO, male   DOB: 05-22-1966, 50 y.o.   MRN: [id number] Patient ID: KYALL WESBY, male   DOB: April 08, 1966, 50 y.o.   MRN: [id number] Patient ID: ULRICK ALBERTA, male   DOB: 03/25/1966, 50 y.o.   MRN: [id number] Patient ID: MACHI VANPATTEN, male   DOB: 01-28-1966, 50 y.o.   MRN: [id number] Patient ID: MELODY KIRCHER, male   DOB: Nov 19, 1965, 50 y.o.   MRN: [id number] Patient ID: TEMUR SCHU, male   DOB: [birth year], 50 y.o.   MRN: [id number] Patient ID: ERICH RICKE, male   DOB: 05-05-1966, 50 y.o.   MRN: [id number] Patient ID: AYUUB SCOMA, male   DOB: 01/29/1966, 50 y.o.   MRN: [id number] Patient ID: RALIK HERRIG, male   DOB: 11/20/1965, 50 y.o.   MRN: [id number] Patient ID: GILROY MCKELLIPS, male   DOB: June 06, 1966, 50 y.o.   MRN: [id number] Patient ID: GIORDAN GILBERTSON, male   DOB: [birth year], 50 y.o.   MRN: [id number] Patient ID: KIVEN SHERWIN, male   DOB: [birth year], 50 y.o.   MRN: [id number] [facility] [id number] Progress Note PENROSE ROTTA MRN: [id number] DOB: [birth year] Age: 50 y.o.  Date: 12/01/2015  Chief Complaint  Patient presents with  . Depression  . Anxiety  . Follow-up   History of Chief Complaint:   This 50 year old Caucasian male who came for his followup appointment. He is currently living with his wife in [place]. He is unemployed and applying for disability.  The patient states that he has been employed for 5 years. 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 He got injured on the job and both shoulders were torn. He has not been able to work ever since and he feels very badly about this. 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  Since he's not been able to  work the patient has been having increasingly depressed and anxious. He feels like his body gives out when he is trying to exert himself even in a minor way. He's been in the ER recently for chest pain which was ruled out as a panic attack. He also saw cardiologist this week and will be having a stress test. His blood pressure was too low and his lisinopril was cut down. He is also trying to quit smoking by using electronic cigarettes.  The patient returns after 3 months. He states that he is trying to do more like drive places on his own. When he goes into stores like [employer] he gets overwhelmed and panicky. He still has chest pain and "fluttering in my chest "at night and it makes it difficult for him to sleep. He's had evaluations from GI and cardiology and they both feel that anxiety plays a big part. He does take his Xanax at bedtime but it doesn't always help we discussed trying another SSRI and I suggested Paxil because it helps anxiety and sleep and he reluctantly agreed. Depression        Associated symptoms include fatigue.  Past medical history includes anxiety.   Anxiety Presents for follow-up visit. Symptoms include nervous/anxious  behavior.     Review of Systems  Constitutional: Positive for fatigue.  Gastrointestinal: Negative.   Musculoskeletal: Positive for back pain.  Skin: Negative.   Psychiatric/Behavioral: Positive for depression, sleep disturbance and dysphoric mood. The patient is nervous/anxious.     Physical Exam Vitals: BP 116/73 mmHg  Pulse 56  Ht 5\' 11"  (1.803 m)  Wt 208 lb 6.4 oz (94.53 kg)  BMI 29.08 kg/m2  SpO2 95%   Past Psychiatric History: Diagnosis: none  Hospitalizations: none  Outpatient Care: none  Substance Abuse Care: none  Self-Mutilation: none  Suicidal Attempts: none  Violent Behaviors: none   Allergies: Allergies  Allergen Reactions  . Dexilant [Dexlansoprazole] Anaphylaxis  . Mushroom Ext Cmplx(Shiitake-Reishi-Mait) Anaphylaxis     Rapid heart rate.  . Penicillins Anaphylaxis    Has patient had a PCN reaction causing immediate rash, facial/tongue/throat swelling, SOB or lightheadedness with hypotension: Yes Has patient had a PCN reaction causing severe rash involving mucus membranes or skin necrosis: No Has patient had a PCN reaction that required hospitalization Yes Has patient had a PCN reaction occurring within the last 10 years: No If all of the above answers are "NO", then may proceed with Cephalosporin use.    Medical History: Past Medical History  Diagnosis Date  . Chest pain     + palpitations; cath 2005- 30-40% mid LAD, 20% D1, 20% cx, OM, 20-30% RCA, and EF-55%  . COPD (chronic obstructive pulmonary disease) ([place])   . GERD (gastroesophageal reflux disease)   . Hyperlipemia   . Hypertension   . Depression   . Colitis 1990  . Tobacco abuse     1/2 pack per day  . Gastric ulcer 2003; 2012    2003: + esophagitis; negative H.pylori serology  2012: Dr. Oneida Alar, mild gastritis, Bravo PH probe placement, negative H.pylori  . Hepatic steatosis   . Chronic low back pain   . Panic attacks   . Asthma   . Pneumonia 2000s X 2  . Type II diabetes mellitus ([place])   . History of hiatal hernia   . Arthritis     "legs, spine, shoulders" (08/12/2015)   Surgical History: Past Surgical History  Procedure Laterality Date  . Colonoscopy  1990  . Shoulder arthroscopy w/ rotator cuff repair Right 2006    acromioclavicular joint arthrosis  . Neck mass excision Right     "done in dr's office; behind right ear/side of ncek"  . Bravo ph study  05/03/2011    [id number] gastritis/normal esophagus and duodenum  . Esophagogastroduodenoscopy  05/03/2011    [id number] gastritis  . Cardiac catheterization  1990s X 1; 2005; 08/12/2015  . Cardiac catheterization N/A 08/12/2015    Procedure: Left Heart Cath and Coronary Angiography;  Surgeon: Belva Crome, MD; LAD 20%, CFX 65%, RCA 20%, EF 60%    Family History: family history  includes ADD / ADHD in his daughter; Alcohol abuse in his father and mother; Anxiety disorder in his sister and sister; Dementia in his cousin and paternal uncle; Depression in his sister; Diabetes in his brother and father; Heart attack (age of onset: 42) in his brother; Heart attack (age of onset: 80) in his father; Hypertension in his brother, brother, and brother; Lung cancer in his mother; Seizures in his brother. There is no history of Colon cancer, Drug abuse, Bipolar disorder, OCD, Paranoid behavior, Schizophrenia, Sexual abuse, or Physical abuse. Reviewed again today and nothing new.   Current Medications:  Current Outpatient Prescriptions  Medication Sig Dispense  Refill  . acetaminophen (TYLENOL) 325 MG tablet Take 650 mg by mouth every 6 (six) hours as needed for pain or fever.     [name] albuterol (VENTOLIN HFA) 108 (90 BASE) MCG/ACT inhaler Inhale 2 puffs into the lungs every 6 (six) hours as needed for wheezing or shortness of breath.     . ALPRAZolam (XANAX) 0.5 MG tablet Take one three times a day and two at night 150 tablet 2  . amLODipine (NORVASC) 5 MG tablet Take 1 tablet (5 mg total) by mouth daily. 30 tablet 5  . aspirin 81 MG tablet Take 81 mg by mouth at bedtime.     . budesonide-formoterol (SYMBICORT) 80-4.5 MCG/ACT inhaler Take 2 puffs first thing in am and then another 2 puffs about 12 hours later.    . calcium carbonate (TUMS - DOSED IN MG ELEMENTAL CALCIUM) 500 MG chewable tablet Chew 1 tablet by mouth daily as needed for indigestion or heartburn.    . cyclobenzaprine (FLEXERIL) 10 MG tablet Take 1 tablet (10 mg total) by mouth 3 (three) times daily. 21 tablet 0  . esomeprazole (NEXIUM) 40 MG capsule Take 1 capsule (40 mg total) by mouth 2 (two) times daily before a meal. 180 capsule 3  . HYDROcodone-acetaminophen (NORCO/VICODIN) 5-325 MG tablet Take 1 tablet by mouth every 4 (four) hours as needed. 15 tablet 0  . lovastatin (MEVACOR) 20 MG tablet TAKE ONE TABLET BY MOUTH ONCE  DAILY AT 6:OO PM 30 tablet 11  . metFORMIN (GLUCOPHAGE) 500 MG tablet Take 1 tablet (500 mg total) by mouth 2 (two) times daily with a meal. 30 tablet 2  . metoprolol succinate (TOPROL-XL) 50 MG 24 hr tablet Take 1 tablet (50 mg total) by mouth daily. Take with or immediately following a meal. 30 tablet 3  . nitroGLYCERIN (NITROSTAT) 0.4 MG SL tablet Place 1 tablet (0.4 mg total) under the tongue every 5 (five) minutes as needed for chest pain. 25 tablet 3  . PARoxetine (PAXIL) 20 MG tablet Take 1 tablet (20 mg total) by mouth at bedtime. 30 tablet 2   No current facility-administered medications for this visit.    Previous Psychotropic Medications: Medication Dose   Xanax     Substance Abuse History in the last 12 months: Substance Age of 1st Use Last Use Amount Specific Type  Nicotine  17  2 hours ago  1  cigarette  Alcohol  22  38      Cannabis  none        Opiates  36  today  2.5 mg  hyrdocodone  Cocaine  none        Methamphetamines  none        LSD  none        Ecstasy  none         Benzodiazepines  22  started in early thrities  0.5 mg  Xanax  Caffeine  childhood  this AM  1 cup  M[place] Dew  Inhalants  none        Others:      Sugar  11  this AM  20 tsps  in the [place] Consequences of Substance Abuse: pain Legal Consequences of Substance Abuse: none Family Consequences of Substance Abuse: none Blackouts:  No DT's:  No Withdrawal Symptoms:  Yes Tremors  Social History: Patient lives with his life, daughter and her husband with 2 grandbabies.  He has 2 sons and the daughter.  He is a high  school graduate.    Mental Status Examination/Evaluation: Objective:  Appearance: Casual  Eye Contact::  Good  Speech:  Clear and Coherent  Volume:  Normal  Mood: Still anxious but not quite as depressed   Affect:  Congruent  Thought Process:  Coherent, Intact and Linear  Orientation:  Full (Time, Place, and Person)  Thought Content:  WDL  Suicidal Thoughts:  No  Homicidal  Thoughts:  No  Judgement:  Good  Insight:  Fair  Psychomotor Activity:  Normal  Akathisia:  No  Handed:  Right  AIMS (if indicated):    Assets:  Communication Skills Desire for Improvement   Lab Results:  Results for orders placed or performed during the hospital encounter of 08/12/15 (from the past 8736 hour(s))  Glucose, capillary   Collection Time: 08/12/15  6:14 AM  Result Value Ref Range   Glucose-Capillary 124 (H) 65 - 99 mg/dL  Protime-INR   Collection Time: 08/12/15  6:46 AM  Result Value Ref Range   Prothrombin Time 13.1 11.6 - 15.2 seconds   INR 0.97 0.00 - [id number]  Basic metabolic panel   Collection Time: 08/12/15  6:46 AM  Result Value Ref Range   Sodium 138 135 - 145 mmol/L   Potassium 4.2 3.5 - 5.1 mmol/L   Chloride 101 101 - 111 mmol/L   CO2 25 22 - 32 mmol/L   Glucose, Bld 137 (H) 65 - 99 mg/dL   BUN 12 6 - 20 mg/dL   Creatinine, Ser 0.89 0.61 - 1.24 mg/dL   Calcium 9.2 8.9 - 10.3 mg/dL   GFR calc non Af Amer >60 >60 mL/min   GFR calc Af Amer >60 >60 mL/min   Anion gap 12 5 - 15  CBC   Collection Time: 08/12/15  6:46 AM  Result Value Ref Range   WBC 6.6 4.0 - 10.5 K/uL   RBC 4.99 4.22 - 5.81 MIL/uL   Hemoglobin 15.7 13.0 - 17.0 g/dL   HCT 45.0 39.0 - 52.0 %   MCV 90.2 78.0 - 100.0 fL   MCH 31.5 26.0 - 34.0 pg   MCHC 34.9 30.0 - 36.0 g/dL   RDW 12.3 11.5 - 15.5 %   Platelets 232 150 - 400 K/uL  Glucose, capillary   Collection Time: 08/12/15  8:43 AM  Result Value Ref Range   Glucose-Capillary 128 (H) 65 - 99 mg/dL  Glucose, capillary   Collection Time: 08/12/15 12:17 PM  Result Value Ref Range   Glucose-Capillary 155 (H) 65 - 99 mg/dL  APTT   Collection Time: 08/12/15 12:36 PM  Result Value Ref Range   aPTT 30 24 - 37 seconds  Glucose, capillary   Collection Time: 08/12/15  5:58 PM  Result Value Ref Range   Glucose-Capillary 117 (H) 65 - 99 mg/dL  Glucose, capillary   Collection Time: 08/12/15 10:11 PM  Result Value Ref Range    Glucose-Capillary 129 (H) 65 - 99 mg/dL  Hemoglobin A1c   Collection Time: 08/13/15  5:10 AM  Result Value Ref Range   Hgb A1c MFr Bld 7.8 (H) 4.8 - 5.6 %   Mean Plasma Glucose 177 mg/dL  Lipid panel   Collection Time: 08/13/15  5:10 AM  Result Value Ref Range   Cholesterol 119 0 - 200 mg/dL   Triglycerides 142 <150 mg/dL   HDL 27 (L) >40 mg/dL   Total CHOL/HDL Ratio 4.4 RATIO   VLDL 28 0 - 40 mg/dL   LDL Cholesterol 64 0 - 99  mg/dL  Glucose, capillary   Collection Time: 08/13/15  7:21 AM  Result Value Ref Range   Glucose-Capillary 104 (H) 65 - 99 mg/dL   Comment 1 Notify RN    Comment 2 Document in Chart   Glucose, capillary   Collection Time: 08/13/15 11:17 AM  Result Value Ref Range   Glucose-Capillary 171 (H) 65 - 99 mg/dL  Glucose, capillary   Collection Time: 08/13/15  4:25 PM  Result Value Ref Range   Glucose-Capillary 124 (H) 65 - 99 mg/dL  Results for orders placed or performed in visit on 08/11/15 (from the past 8736 hour(s))  Hepatic function panel   Collection Time: 08/31/15  3:54 PM  Result Value Ref Range   Total Bilirubin 0.3 0.2 - 1.2 mg/dL   Bilirubin, Direct 0.1 <=0.2 mg/dL   Indirect Bilirubin 0.2 0.2 - 1.2 mg/dL   Alkaline Phosphatase 53 40 - 115 U/L   AST 17 10 - 35 U/L   ALT 25 9 - 46 U/L   Total Protein 6.8 6.1 - 8.1 g/dL   Albumin 4.3 3.6 - 5.1 g/dL  Results for orders placed or performed in visit on 08/03/15 (from the past 8736 hour(s))  Basic metabolic panel   Collection Time: 08/03/15 12:30 PM  Result Value Ref Range   Sodium 132 (L) 135 - 146 mmol/L   Potassium 4.6 3.5 - 5.3 mmol/L   Chloride 99 98 - 110 mmol/L   CO2 26 20 - 31 mmol/L   Glucose, Bld 179 (H) 65 - 99 mg/dL   BUN 15 7 - 25 mg/dL   Creat 0.85 0.70 - 1.33 mg/dL   Calcium 9.5 8.6 - 10.3 mg/dL  Results for orders placed or performed in visit on 05/05/15 (from the past 8736 hour(s))  Lipid panel   Collection Time: 05/05/15  8:47 AM  Result Value Ref Range   Cholesterol 146  125 - 200 mg/dL   Triglycerides 204 (H) <150 mg/dL   HDL 28 (L) >=40 mg/dL   Total CHOL/HDL Ratio 5.2 (H) <=5.0 Ratio   VLDL 41 (H) <30 mg/dL   LDL Cholesterol 77 <130 mg/dL  Results for orders placed or performed during the hospital encounter of 05/05/15 (from the past 8736 hour(s))  Spirometry with Graph   Collection Time: 05/05/15  9:40 AM  Result Value Ref Range   FVC-Pre 4.67 L   FVC-%Pred-Pre 89 %   FEV1-Pre 3.47 L   FEV1-%Pred-Pre 85 %   FEV6-Pre 4.65 L   FEV6-%Pred-Pre 92 %   Pre FEV1/FVC ratio 74 %   FEV1FVC-%Pred-Pre 95 %   Pre FEV6/FVC Ratio 100 %   FEV6FVC-%Pred-Pre 102 %   FEF 25-75 Pre 2.60 L/sec   FEF2575-%Pred-Pre 73 %    Assessment:   AXIS I Generalized Anxiety Disorder, Major Depression, Recurrent severe and Panic Disorder  AXIS II Deferred  AXIS III Past Medical History  Diagnosis Date  . Chest pain     + palpitations; cath 2005- 30-40% mid LAD, 20% D1, 20% cx, OM, 20-30% RCA, and EF-55%  . COPD (chronic obstructive pulmonary disease) ([place])   . GERD (gastroesophageal reflux disease)   . Hyperlipemia   . Hypertension   . Depression   . Colitis 1990  . Tobacco abuse     1/2 pack per day  . Gastric ulcer 2003; 2012    2003: + esophagitis; negative H.pylori serology  2012: Dr. Oneida Alar, mild gastritis, Bravo PH probe placement, negative H.pylori  . Hepatic steatosis   .  Chronic low back pain   . Panic attacks   . Asthma   . Pneumonia 2000s X 2  . Type II diabetes mellitus ([place])   . History of hiatal hernia   . Arthritis     "legs, spine, shoulders" (08/12/2015)     AXIS IV other psychosocial or environmental problems  AXIS V 41-50 serious symptoms   Treatment Plan/Recommendations: Laboratory:    Psychotherapy: supportive psychotherapy, he will reschedule with Jenny Reichmann Rodenbaugh   Medications:  He will continue  Xanax 0.5 mg 3 times a day and 1 mg at bedtime. He will start Paxil 20 mg at bedtime   Routine PRN Medications:  No  Consultations: none   Safety Concerns:  none  Other:  Return in 6 weeks      MEDICATIONS this encounter: Meds ordered this encounter  Medications  . PARoxetine (PAXIL) 20 MG tablet    Sig: Take 1 tablet (20 mg total) by mouth at bedtime.    Dispense:  30 tablet    Refill:  2  . ALPRAZolam (XANAX) 0.5 MG tablet    Sig: Take one three times a day and two at night    Dispense:  150 tablet    Refill:  2   Medical Decision Making Problem Points:  Established problem, stable/improving (1), Review of last therapy session (1) and Review of psycho-social stressors (1) Data Points:  Review or order clinical lab tests (1) Review of medication regiment & side effects (2) Review of new medications or change in dosage (2)  I certify that outpatient services furnished can reasonably be expected to improve the patient's condition.   [name], MD

## 2015-12-07 ENCOUNTER — Ambulatory Visit (INDEPENDENT_AMBULATORY_CARE_PROVIDER_SITE_OTHER): Payer: Self-pay | Admitting: Psychology

## 2015-12-07 DIAGNOSIS — F329 Major depressive disorder, single episode, unspecified: Secondary | ICD-10-CM

## 2015-12-07 DIAGNOSIS — F4001 Agoraphobia with panic disorder: Secondary | ICD-10-CM

## 2015-12-07 DIAGNOSIS — F419 Anxiety disorder, unspecified: Secondary | ICD-10-CM

## 2015-12-07 DIAGNOSIS — F418 Other specified anxiety disorders: Secondary | ICD-10-CM

## 2015-12-10 ENCOUNTER — Encounter (HOSPITAL_COMMUNITY): Payer: Self-pay | Admitting: Psychology

## 2015-12-10 NOTE — Progress Notes (Signed)
PROGRESS NOTE  Patient:  Richard [name]   DOB: 1966/01/25  MR Number: [id number]  Location: [facility] [address] [place] [address] Dept: [phone]  Start: 1 PM End: 2 PM  Provider/Observer:     Edgardo Roys PSYD  Chief Complaint:      Chief Complaint  Patient presents with  . Agitation  . Anxiety  . Stress    Reason For Service:    The patient was referred by Dr. Lattie Haw because of severe anxiety and foot appear to be panic attacks. The patient reports that he has been having a lot of anxiety and panic attacks. He has numerous medical issues including severe issues with diabetes, COPD, fatty liver disease and orthopedic issues. He also has had a long history of depression anxiety as well. Anxiety and depression have been going on for least 5 years and correlate with his health issues and financial difficulties. The patient relates this deterioration to an injury at work where he injured both of his shoulders while pulling a pallet. He had surgery on one of his shoulders initially and he experiences around his injury and surgery began to develop panic attacks. The patient is also been diagnosed with COPD and diabetes but continues to smoke and fears that if he completely quit smoking that he will gain a lot of weight again.  The patient returns reporting that he has continued to struggle with panic events as well as his significant medical issues and chronic pain.  He has been working on Radiographer, therapeutic very consistently and has reduced the frequency of his panic events and anxiety symptoms.   Interventions Strategy:  Cognitive/behavioral psychotherapeutic interventions  Participation Level:   Active  Participation Quality:  Appropriate      Behavioral Observation:  Fairly Groomed, Alert, and Appropriate.   Current Psychosocial Factors: The patient reports that he has Continued  to struggle with social interaction and motivation to be engaged. He reports that panic attacks have improved but they continue to be problematic.  Content of Session:   Review current symptoms and continued work on therapeutic interventions around numerous medical issues and panic attacks.  Current Status:   Patient reports that he had continued to have severe pain but reduced panic attacks. pulmonary issues persist.  Depression much worse due to denial of disability.  Patient Progress:   Stable  Target Goals:   Target goals include reduce the intensity, duration, and frequency of panic attacks, reduce the amount of benzodiazepine or other potentially dependence forming medications.  Last Reviewed:   12/08/2015  Goals Addressed Today:    Today we worked on issues of reducing the intensity and frequency of panic attacks  Impression/Diagnosis:   the patient appears to be doing with panic attacks along with anxiety and depression. He has numerous medical issues including COPD, diabetes, and other issues as well as liver disease. The patient is not taking very good care of himself and it was stressed specifically that he is going to need to quit smoking cigarettes and completely stopped caffeinated and sugar drinks if he is going to have any hope of improving his quality of life. The patient was amenable to initially try to stop the caffeinated drinks and we developed a plan for this. After that we will begin to address the smoking.    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

## 2015-12-15 ENCOUNTER — Ambulatory Visit: Payer: Self-pay | Admitting: Internal Medicine

## 2015-12-16 ENCOUNTER — Telehealth: Payer: Self-pay | Admitting: Gastroenterology

## 2015-12-16 NOTE — Telephone Encounter (Signed)
Pt's wife called to see if they could get samples of Nexium.

## 2015-12-16 NOTE — Telephone Encounter (Signed)
Nexium OTC #20 tablets left at front for pt and wife is aware.

## 2015-12-24 ENCOUNTER — Ambulatory Visit (INDEPENDENT_AMBULATORY_CARE_PROVIDER_SITE_OTHER): Payer: Self-pay | Admitting: Gastroenterology

## 2015-12-24 ENCOUNTER — Encounter: Payer: Self-pay | Admitting: Gastroenterology

## 2015-12-24 VITALS — BP 121/77 | HR 63 | Temp 97.8°F | Ht 71.0 in | Wt 205.4 lb

## 2015-12-24 DIAGNOSIS — K5900 Constipation, unspecified: Secondary | ICD-10-CM | POA: Insufficient documentation

## 2015-12-24 DIAGNOSIS — K219 Gastro-esophageal reflux disease without esophagitis: Secondary | ICD-10-CM

## 2015-12-24 MED ORDER — LINACLOTIDE 145 MCG PO CAPS: 145.0000 ug | ORAL_CAPSULE | Freq: Every day | ORAL | Status: DC

## 2015-12-24 NOTE — Patient Instructions (Addendum)
Continue Nexium once each morning. Take Zantac each evening as needed.   I have given you a prescription for Linzess 145 mcg to take once each morning, 30 minutes before breakfast. I have given you a month supply free. Fill out the forms to get patient assistance.   We will see you back in 6-8 weeks to set up a colonoscopy.
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
## 2015-12-31 NOTE — Assessment & Plan Note (Signed)
Multiple non-GI complaints. GERD not ideally controlled due to dietary/behavior. Strict diet/behavior discussed. Continue Nexium each morning, Zantac in evening.

## 2015-12-31 NOTE — Assessment & Plan Note (Signed)
Trial Linzess 145 mcg once daily. Return in next few weeks to schedule initial screening colonoscopy. Patient continues to decline to arrange this due to multiple health issues and life events.

## 2016-01-03 ENCOUNTER — Telehealth: Payer: Self-pay | Admitting: Gastroenterology

## 2016-01-03 NOTE — Telephone Encounter (Signed)
Pt's wife called to see if we had any samples of Nexium for her husband. [id number]

## 2016-01-03 NOTE — Progress Notes (Signed)
CC'D TO PCP °

## 2016-01-04 NOTE — Telephone Encounter (Signed)
We do not have samples of Nexium. I tried to call pt and VM not set up.

## 2016-01-04 NOTE — Telephone Encounter (Signed)
Pt's wife is aware we have no samples.

## 2016-01-10 ENCOUNTER — Telehealth: Payer: Self-pay | Admitting: Gastroenterology

## 2016-01-10 NOTE — Telephone Encounter (Signed)
Pt wife called and stated that he is having pain in the area of his gallbladder and it was brought up at his last visit that he may need an ultrasound.  Is that something we can go ahead and schedule?

## 2016-01-10 NOTE — Telephone Encounter (Signed)
Please advise 

## 2016-01-11 ENCOUNTER — Other Ambulatory Visit: Payer: Self-pay

## 2016-01-11 ENCOUNTER — Telehealth: Payer: Self-pay | Admitting: Gastroenterology

## 2016-01-11 DIAGNOSIS — R1011 Right upper quadrant pain: Principal | ICD-10-CM

## 2016-01-11 DIAGNOSIS — G8929 Other chronic pain: Secondary | ICD-10-CM

## 2016-01-11 NOTE — Telephone Encounter (Signed)
Yes, we may go ahead and set up US abdomen.

## 2016-01-11 NOTE — Telephone Encounter (Signed)
Pt is set up for [name] on 01/14/16 @ 8:00. Pt is aware.

## 2016-01-11 NOTE — Telephone Encounter (Signed)
Pt's wife called to see if anyone had followed up on her call from yesterday.

## 2016-01-11 NOTE — Telephone Encounter (Signed)
Patient's wife was informed that Richard [name] will be reviewing her message and we will get back to her.  She went on to say that her husband is in a lot of pain and can barely function

## 2016-01-11 NOTE — Telephone Encounter (Signed)
Pt's wife had called to see if this has been addressed. I tried to call to let her know the message was sent to Laban Emperor, NP who has just returned from vacation. But I could not leave a VM.

## 2016-01-11 NOTE — Telephone Encounter (Signed)
See note of 01/10/2016.

## 2016-01-12 ENCOUNTER — Encounter (HOSPITAL_COMMUNITY): Payer: Self-pay | Admitting: Psychiatry

## 2016-01-12 ENCOUNTER — Ambulatory Visit (INDEPENDENT_AMBULATORY_CARE_PROVIDER_SITE_OTHER): Payer: Self-pay | Admitting: Psychiatry

## 2016-01-12 VITALS — BP 137/73 | HR 65 | Ht 71.0 in | Wt 205.6 lb

## 2016-01-12 DIAGNOSIS — F32A Depression, unspecified: Secondary | ICD-10-CM

## 2016-01-12 DIAGNOSIS — F418 Other specified anxiety disorders: Secondary | ICD-10-CM

## 2016-01-12 DIAGNOSIS — M545 Low back pain, unspecified: Secondary | ICD-10-CM

## 2016-01-12 DIAGNOSIS — F4001 Agoraphobia with panic disorder: Secondary | ICD-10-CM

## 2016-01-12 DIAGNOSIS — F17200 Nicotine dependence, unspecified, uncomplicated: Secondary | ICD-10-CM

## 2016-01-12 DIAGNOSIS — G8929 Other chronic pain: Secondary | ICD-10-CM

## 2016-01-12 DIAGNOSIS — F419 Anxiety disorder, unspecified: Secondary | ICD-10-CM

## 2016-01-12 DIAGNOSIS — F329 Major depressive disorder, single episode, unspecified: Secondary | ICD-10-CM

## 2016-01-12 MED ORDER — PAROXETINE HCL 20 MG PO TABS: 20.0000 mg | ORAL_TABLET | Freq: Every day | ORAL | Status: DC

## 2016-01-12 MED ORDER — ALPRAZOLAM 0.5 MG PO TABS: ORAL_TABLET | ORAL | Status: DC

## 2016-01-12 NOTE — Progress Notes (Signed)
Patient ID: ROMIN FREESE, male   DOB: 1966-05-21, 50 y.o.   MRN: [id number] Patient ID: TRESHAWN PLEVA, male   DOB: July 11, 1966, 50 y.o.   MRN: [id number] Patient ID: JAMAIR NOFTZ, male   DOB: Sep 16, 1965, 50 y.o.   MRN: [id number] Patient ID: AVISH CONSIDINE, male   DOB: Jul 31, 1965, 50 y.o.   MRN: [id number] Patient ID: JOHAAN BLEND, male   DOB: [birth year], 50 y.o.   MRN: [id number] Patient ID: NERI LOO, male   DOB: Aug 22, 1965, 50 y.o.   MRN: [id number] Patient ID: CHARMING ANTONOVICH, male   DOB: 1966/07/07, 50 y.o.   MRN: [id number] Patient ID: KRAYTON HORNEY, male   DOB: 07/16/1966, 50 y.o.   MRN: [id number] Patient ID: THARUN PUCK, male   DOB: 04-04-1966, 50 y.o.   MRN: [id number] Patient ID: NYAN WHAN, male   DOB: 31-May-1966, 50 y.o.   MRN: [id number] Patient ID: LADON SCHLUETER, male   DOB: 14-Jul-1966, 50 y.o.   MRN: [id number] Patient ID: IZAIH JAGGARD, male   DOB: 01/10/1966, 50 y.o.   MRN: [id number] Patient ID: RJAY KIRSHENBAUM, male   DOB: 28-Jul-1965, 50 y.o.   MRN: [id number] Patient ID: ARDEL SLOAN, male   DOB: 1965/09/11, 50 y.o.   MRN: [id number] Patient ID: KHAREEM KLOMP, male   DOB: 1966/01/15, 50 y.o.   MRN: [id number] Patient ID: MARCANGELO HEMMING, male   DOB: August 10, 1965, 50 y.o.   MRN: [id number] [facility] [id number] Progress Note SAM HARDESTER MRN: [id number] DOB: 02-01-1966 Age: 50 y.o.  Date: 01/12/2016  Chief Complaint  Patient presents with  . Depression  . Anxiety  . Follow-up   History of Chief Complaint:   This 50 year old Caucasian male who came for his followup appointment. He is currently living with his wife in [place]. He is unemployed and applying for disability.  The patient states that he has been employed for 5 years. 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 He got injured on the job and both shoulders were torn. He has not been able to work ever since and he feels very badly about this. 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  Since he's not been able to work the patient has been having increasingly depressed and anxious. He feels like his body gives out when he is trying to exert himself even in a minor way. He's been in the ER recently for chest pain which was ruled out as a panic attack. He also saw cardiologist this week and will be having a stress test. His blood pressure was too low and his lisinopril was cut down. He is also trying to quit smoking by using electronic cigarettes.  The patient returns after 6 weeks. He states that he is having constant heart palpitations and he's not able to sleep or function. He saw a cardiologist up until about February and he was released back to primary care. His [facility] and they cannot see him for about a month. He states that he is up all night pacing the floor with his heart beating fast and still happening now. However his pulse is 65 and his blood pressure is normal. As far as he knows his wife is giving him the Paxil. I told him we could call his cardiology office to see if that he get a  man as this seems to be his worst problem today Depression        Associated symptoms include fatigue.  Past medical history includes anxiety.   Anxiety Presents for follow-up visit. Symptoms include nervous/anxious behavior.     Review of Systems  Constitutional: Positive for fatigue.  Gastrointestinal: Negative.   Musculoskeletal: Positive for back pain.  Skin: Negative.   Psychiatric/Behavioral: Positive for depression, sleep disturbance and dysphoric mood. The patient is nervous/anxious.     Physical Exam Vitals: BP 137/73 mmHg  Pulse 65  Ht 5\' 11"  (1.803 m)  Wt 205 lb 9.6 oz (93.26 kg)  BMI 28.69 kg/m2  SpO2 97%   Past Psychiatric History: Diagnosis: none  Hospitalizations: none  Outpatient Care: none  Substance Abuse Care: none  Self-Mutilation: none  Suicidal Attempts: none  Violent  Behaviors: none   Allergies: Allergies  Allergen Reactions  . Dexilant [Dexlansoprazole] Anaphylaxis  . Mushroom Ext Cmplx(Shiitake-Reishi-Mait) Anaphylaxis    Rapid heart rate.  . Penicillins Anaphylaxis    Has patient had a PCN reaction causing immediate rash, facial/tongue/throat swelling, SOB or lightheadedness with hypotension: Yes Has patient had a PCN reaction causing severe rash involving mucus membranes or skin necrosis: No Has patient had a PCN reaction that required hospitalization Yes Has patient had a PCN reaction occurring within the last 10 years: No If all of the above answers are "NO", then may proceed with Cephalosporin use.    Medical History: Past Medical History  Diagnosis Date  . Chest pain     + palpitations; cath 2005- 30-40% mid LAD, 20% D1, 20% cx, OM, 20-30% RCA, and EF-55%  . COPD (chronic obstructive pulmonary disease) ([place])   . GERD (gastroesophageal reflux disease)   . Hyperlipemia   . Hypertension   . Depression   . Colitis 1990  . Tobacco abuse     1/2 pack per day  . Gastric ulcer 2003; 2012    2003: + esophagitis; negative H.pylori serology  2012: Dr. Oneida Alar, mild gastritis, Bravo PH probe placement, negative H.pylori  . Hepatic steatosis   . Chronic low back pain   . Panic attacks   . Asthma   . Pneumonia 2000s X 2  . Type II diabetes mellitus ([place])   . History of hiatal hernia   . Arthritis     "legs, spine, shoulders" (08/12/2015)   Surgical History: Past Surgical History  Procedure Laterality Date  . Colonoscopy  1990  . Shoulder arthroscopy w/ rotator cuff repair Right 2006    acromioclavicular joint arthrosis  . Neck mass excision Right     "done in dr's office; behind right ear/side of ncek"  . Bravo ph study  05/03/2011    [id number] gastritis/normal esophagus and duodenum  . Esophagogastroduodenoscopy  05/03/2011    [id number] gastritis  . Cardiac catheterization  1990s X 1; 2005; 08/12/2015  . Cardiac catheterization N/A  08/12/2015    Procedure: Left Heart Cath and Coronary Angiography;  Surgeon: Belva Crome, MD; LAD 20%, CFX 65%, RCA 20%, EF 60%    Family History: family history includes ADD / ADHD in his daughter; Alcohol abuse in his father and mother; Anxiety disorder in his sister and sister; Dementia in his cousin and paternal uncle; Depression in his sister; Diabetes in his brother and father; Heart attack (age of onset: 60) in his brother; Heart attack (age of onset: 59) in his father; Hypertension in his brother, brother, and brother; Lung cancer in his mother; Seizures in his brother.  There is no history of Colon cancer, Drug abuse, Bipolar disorder, OCD, Paranoid behavior, Schizophrenia, Sexual abuse, or Physical abuse. Reviewed again today and nothing new.   Current Medications:  Current Outpatient Prescriptions  Medication Sig Dispense Refill  . acetaminophen (TYLENOL) 325 MG tablet Take 650 mg by mouth every 6 (six) hours as needed for pain or fever.     [name] albuterol (VENTOLIN HFA) 108 (90 BASE) MCG/ACT inhaler Inhale 2 puffs into the lungs every 6 (six) hours as needed for wheezing or shortness of breath.     . ALPRAZolam (XANAX) 0.5 MG tablet Take one three times a day and two at night 150 tablet 2  . amLODipine (NORVASC) 5 MG tablet Take 1 tablet (5 mg total) by mouth daily. 30 tablet 5  . aspirin 81 MG tablet Take 81 mg by mouth at bedtime.     . budesonide-formoterol (SYMBICORT) 80-4.5 MCG/ACT inhaler Take 2 puffs first thing in am and then another 2 puffs about 12 hours later.    . calcium carbonate (TUMS - DOSED IN MG ELEMENTAL CALCIUM) 500 MG chewable tablet Chew 1 tablet by mouth daily as needed for indigestion or heartburn.    . cyclobenzaprine (FLEXERIL) 10 MG tablet Take 1 tablet (10 mg total) by mouth 3 (three) times daily. 21 tablet 0  . esomeprazole (NEXIUM) 40 MG capsule Take 1 capsule (40 mg total) by mouth 2 (two) times daily before a meal. 180 capsule 3  . HYDROcodone-acetaminophen  (NORCO/VICODIN) 5-325 MG tablet Take 1 tablet by mouth every 4 (four) hours as needed. 15 tablet 0  . linaclotide (LINZESS) 145 MCG CAPS capsule Take 1 capsule (145 mcg total) by mouth daily before breakfast. 30 capsule 0  . lovastatin (MEVACOR) 20 MG tablet TAKE ONE TABLET BY MOUTH ONCE DAILY AT 6:OO PM 30 tablet 11  . metFORMIN (GLUCOPHAGE) 500 MG tablet Take 1 tablet (500 mg total) by mouth 2 (two) times daily with a meal. 30 tablet 2  . metoprolol succinate (TOPROL-XL) 50 MG 24 hr tablet Take 1 tablet (50 mg total) by mouth daily. Take with or immediately following a meal. 30 tablet 3  . nitroGLYCERIN (NITROSTAT) 0.4 MG SL tablet Place 1 tablet (0.4 mg total) under the tongue every 5 (five) minutes as needed for chest pain. 25 tablet 3  . PARoxetine (PAXIL) 20 MG tablet Take 1 tablet (20 mg total) by mouth at bedtime. 30 tablet 2   No current facility-administered medications for this visit.    Previous Psychotropic Medications: Medication Dose   Xanax     Substance Abuse History in the last 12 months: Substance Age of 1st Use Last Use Amount Specific Type  Nicotine  17  2 hours ago  1  cigarette  Alcohol  22  38      Cannabis  none        Opiates  36  today  2.5 mg  hyrdocodone  Cocaine  none        Methamphetamines  none        LSD  none        Ecstasy  none         Benzodiazepines  22  started in early thrities  0.5 mg  Xanax  Caffeine  childhood  this AM  1 cup  [place]  Inhalants  none        Others:      Sugar  11  this AM  20 tsps  in the  [place]  Medical Consequences of Substance Abuse: pain Legal Consequences of Substance Abuse: none Family Consequences of Substance Abuse: none Blackouts:  No DT's:  No Withdrawal Symptoms:  Yes Tremors  Social History: Patient lives with his life, daughter and her husband with 2 grandbabies.  He has 2 sons and the daughter.  He is a Programmer, systems.    Mental Status Examination/Evaluation: Objective:  Appearance: Casual  Eye  Contact::  Good  Speech:  Clear and Coherent  Volume:  Normal  Mood:  anxious   Affect:  Congruent  Thought Process:  Coherent, Intact and Linear  Orientation:  Full (Time, Place, and Person)  Thought Content:  WDL  Suicidal Thoughts:  No  Homicidal Thoughts:  No  Judgement:  Good  Insight:  Fair  Psychomotor Activity:  Normal  Akathisia:  No  Handed:  Right  AIMS (if indicated):    Assets:  Communication Skills Desire for Improvement   Lab Results:  Results for orders placed or performed during the hospital encounter of 08/12/15 (from the past 8736 hour(s))  Glucose, capillary   Collection Time: 08/12/15  6:14 AM  Result Value Ref Range   Glucose-Capillary 124 (H) 65 - 99 mg/dL  Protime-INR   Collection Time: 08/12/15  6:46 AM  Result Value Ref Range   Prothrombin Time 13.1 11.6 - 15.2 seconds   INR 0.97 0.00 - [id number]  Basic metabolic panel   Collection Time: 08/12/15  6:46 AM  Result Value Ref Range   Sodium 138 135 - 145 mmol/L   Potassium 4.2 3.5 - 5.1 mmol/L   Chloride 101 101 - 111 mmol/L   CO2 25 22 - 32 mmol/L   Glucose, Bld 137 (H) 65 - 99 mg/dL   BUN 12 6 - 20 mg/dL   Creatinine, Ser 0.89 0.61 - 1.24 mg/dL   Calcium 9.2 8.9 - 10.3 mg/dL   GFR calc non Af Amer >60 >60 mL/min   GFR calc Af Amer >60 >60 mL/min   Anion gap 12 5 - 15  CBC   Collection Time: 08/12/15  6:46 AM  Result Value Ref Range   WBC 6.6 4.0 - 10.5 K/uL   RBC 4.99 4.22 - 5.81 MIL/uL   Hemoglobin 15.7 13.0 - 17.0 g/dL   HCT 45.0 39.0 - 52.0 %   MCV 90.2 78.0 - 100.0 fL   MCH 31.5 26.0 - 34.0 pg   MCHC 34.9 30.0 - 36.0 g/dL   RDW 12.3 11.5 - 15.5 %   Platelets 232 150 - 400 K/uL  Glucose, capillary   Collection Time: 08/12/15  8:43 AM  Result Value Ref Range   Glucose-Capillary 128 (H) 65 - 99 mg/dL  Glucose, capillary   Collection Time: 08/12/15 12:17 PM  Result Value Ref Range   Glucose-Capillary 155 (H) 65 - 99 mg/dL  APTT   Collection Time: 08/12/15 12:36 PM  Result Value Ref  Range   aPTT 30 24 - 37 seconds  Glucose, capillary   Collection Time: 08/12/15  5:58 PM  Result Value Ref Range   Glucose-Capillary 117 (H) 65 - 99 mg/dL  Glucose, capillary   Collection Time: 08/12/15 10:11 PM  Result Value Ref Range   Glucose-Capillary 129 (H) 65 - 99 mg/dL  Hemoglobin A1c   Collection Time: 08/13/15  5:10 AM  Result Value Ref Range   Hgb A1c MFr Bld 7.8 (H) 4.8 - 5.6 %   Mean Plasma Glucose 177 mg/dL  Lipid panel  Collection Time: 08/13/15  5:10 AM  Result Value Ref Range   Cholesterol 119 0 - 200 mg/dL   Triglycerides 142 <150 mg/dL   HDL 27 (L) >40 mg/dL   Total CHOL/HDL Ratio 4.4 RATIO   VLDL 28 0 - 40 mg/dL   LDL Cholesterol 64 0 - 99 mg/dL  Glucose, capillary   Collection Time: 08/13/15  7:21 AM  Result Value Ref Range   Glucose-Capillary 104 (H) 65 - 99 mg/dL   Comment 1 Notify RN    Comment 2 Document in Chart   Glucose, capillary   Collection Time: 08/13/15 11:17 AM  Result Value Ref Range   Glucose-Capillary 171 (H) 65 - 99 mg/dL  Glucose, capillary   Collection Time: 08/13/15  4:25 PM  Result Value Ref Range   Glucose-Capillary 124 (H) 65 - 99 mg/dL  Results for orders placed or performed in visit on 08/11/15 (from the past 8736 hour(s))  Hepatic function panel   Collection Time: 08/31/15  3:54 PM  Result Value Ref Range   Total Bilirubin 0.3 0.2 - 1.2 mg/dL   Bilirubin, Direct 0.1 <=0.2 mg/dL   Indirect Bilirubin 0.2 0.2 - 1.2 mg/dL   Alkaline Phosphatase 53 40 - 115 U/L   AST 17 10 - 35 U/L   ALT 25 9 - 46 U/L   Total Protein 6.8 6.1 - 8.1 g/dL   Albumin 4.3 3.6 - 5.1 g/dL  Results for orders placed or performed in visit on 08/03/15 (from the past 8736 hour(s))  Basic metabolic panel   Collection Time: 08/03/15 12:30 PM  Result Value Ref Range   Sodium 132 (L) 135 - 146 mmol/L   Potassium 4.6 3.5 - 5.3 mmol/L   Chloride 99 98 - 110 mmol/L   CO2 26 20 - 31 mmol/L   Glucose, Bld 179 (H) 65 - 99 mg/dL   BUN 15 7 - 25 mg/dL    Creat 0.85 0.70 - 1.33 mg/dL   Calcium 9.5 8.6 - 10.3 mg/dL  Results for orders placed or performed in visit on 05/05/15 (from the past 8736 hour(s))  Lipid panel   Collection Time: 05/05/15  8:47 AM  Result Value Ref Range   Cholesterol 146 125 - 200 mg/dL   Triglycerides 204 (H) <150 mg/dL   HDL 28 (L) >=40 mg/dL   Total CHOL/HDL Ratio 5.2 (H) <=5.0 Ratio   VLDL 41 (H) <30 mg/dL   LDL Cholesterol 77 <130 mg/dL  Results for orders placed or performed during the hospital encounter of 05/05/15 (from the past 8736 hour(s))  Spirometry with Graph   Collection Time: 05/05/15  9:40 AM  Result Value Ref Range   FVC-Pre 4.67 L   FVC-%Pred-Pre 89 %   FEV1-Pre 3.47 L   FEV1-%Pred-Pre 85 %   FEV6-Pre 4.65 L   FEV6-%Pred-Pre 92 %   Pre FEV1/FVC ratio 74 %   FEV1FVC-%Pred-Pre 95 %   Pre FEV6/FVC Ratio 100 %   FEV6FVC-%Pred-Pre 102 %   FEF 25-75 Pre 2.60 L/sec   FEF2575-%Pred-Pre 73 %    Assessment:   AXIS I Generalized Anxiety Disorder, Major Depression, Recurrent severe and Panic Disorder  AXIS II Deferred  AXIS III Past Medical History  Diagnosis Date  . Chest pain     + palpitations; cath 2005- 30-40% mid LAD, 20% D1, 20% cx, OM, 20-30% RCA, and EF-55%  . COPD (chronic obstructive pulmonary disease) ([place])   . GERD (gastroesophageal reflux disease)   . Hyperlipemia   .  Hypertension   . Depression   . Colitis 1990  . Tobacco abuse     1/2 pack per day  . Gastric ulcer 2003; 2012    2003: + esophagitis; negative H.pylori serology  2012: Dr. Oneida Alar, mild gastritis, Bravo PH probe placement, negative H.pylori  . Hepatic steatosis   . Chronic low back pain   . Panic attacks   . Asthma   . Pneumonia 2000s X 2  . Type II diabetes mellitus ([place])   . History of hiatal hernia   . Arthritis     "legs, spine, shoulders" (08/12/2015)     AXIS IV other psychosocial or environmental problems  AXIS V 41-50 serious symptoms   Treatment Plan/Recommendations: Laboratory:     Psychotherapy: supportive psychotherapy, he will reschedule with Jenny Reichmann Rodenbaugh   Medications:  He will continue  Xanax 0.5 mg 3 times a day and 1 mg at bedtime. He will Continue Paxil 20 mg at bedtime   Routine PRN Medications:  No  Consultations: We will call [facility] today to see if we can get a man as soon as possible   Safety Concerns:  none  Other:  Return in 6 weeks      MEDICATIONS this encounter: Meds ordered this encounter  Medications  . PARoxetine (PAXIL) 20 MG tablet    Sig: Take 1 tablet (20 mg total) by mouth at bedtime.    Dispense:  30 tablet    Refill:  2  . ALPRAZolam (XANAX) 0.5 MG tablet    Sig: Take one three times a day and two at night    Dispense:  150 tablet    Refill:  2   Medical Decision Making Problem Points:  Established problem, stable/improving (1), Review of last therapy session (1) and Review of psycho-social stressors (1) Data Points:  Review or order clinical lab tests (1) Review of medication regiment & side effects (2) Review of new medications or change in dosage (2)  I certify that outpatient services furnished can reasonably be expected to improve the patient's condition.   [name], MD

## 2016-01-13 ENCOUNTER — Ambulatory Visit: Payer: Self-pay | Admitting: Cardiovascular Disease

## 2016-01-14 ENCOUNTER — Encounter: Payer: Self-pay | Admitting: Adult Health

## 2016-01-14 ENCOUNTER — Ambulatory Visit (INDEPENDENT_AMBULATORY_CARE_PROVIDER_SITE_OTHER): Payer: Self-pay | Admitting: Adult Health

## 2016-01-14 ENCOUNTER — Other Ambulatory Visit (HOSPITAL_COMMUNITY)
Admission: RE | Admit: 2016-01-14 | Discharge: 2016-01-14 | Disposition: A | Discharge status: Home / Self Care | Payer: No Typology Code available for payment source | Source: Ambulatory Visit | Attending: Adult Health | Admitting: Adult Health

## 2016-01-14 ENCOUNTER — Ambulatory Visit (INDEPENDENT_AMBULATORY_CARE_PROVIDER_SITE_OTHER): Payer: Self-pay

## 2016-01-14 ENCOUNTER — Ambulatory Visit (HOSPITAL_COMMUNITY)
Admission: RE | Admit: 2016-01-14 | Discharge: 2016-01-14 | Disposition: A | Discharge status: Home / Self Care | Payer: Self-pay | Source: Ambulatory Visit | Attending: Gastroenterology | Admitting: Gastroenterology

## 2016-01-14 ENCOUNTER — Other Ambulatory Visit: Payer: Self-pay

## 2016-01-14 VITALS — BP 118/68 | HR 68 | Ht 71.0 in | Wt 206.0 lb

## 2016-01-14 DIAGNOSIS — R1010 Upper abdominal pain, unspecified: Secondary | ICD-10-CM | POA: Insufficient documentation

## 2016-01-14 DIAGNOSIS — R1011 Right upper quadrant pain: Secondary | ICD-10-CM

## 2016-01-14 DIAGNOSIS — G8929 Other chronic pain: Secondary | ICD-10-CM | POA: Insufficient documentation

## 2016-01-14 DIAGNOSIS — R002 Palpitations: Secondary | ICD-10-CM | POA: Insufficient documentation

## 2016-01-14 DIAGNOSIS — R932 Abnormal findings on diagnostic imaging of liver and biliary tract: Secondary | ICD-10-CM | POA: Insufficient documentation

## 2016-01-14 LAB — BASIC METABOLIC PANEL
Anion gap: 6 (ref 5–15)
BUN: 16 mg/dL (ref 6–20)
CALCIUM: 8.7 mg/dL — AB (ref 8.9–10.3)
CHLORIDE: 105 mmol/L (ref 101–111)
CO2: 25 mmol/L (ref 22–32)
CREATININE: 0.94 mg/dL (ref 0.61–1.24)
GFR calc Af Amer: >60 mL/min (ref >60)
GFR calc non Af Amer: >60 mL/min (ref >60)
GLUCOSE: 128 mg/dL — AB (ref 65–99)
Potassium: 3.8 mmol/L (ref 3.5–5.1)
Sodium: 136 mmol/L (ref 135–145)

## 2016-01-14 LAB — MAGNESIUM: Magnesium: 1.9 mg/dL (ref 1.7–2.4)

## 2016-01-14 NOTE — Progress Notes (Signed)
Quick Note:  We can proceed with a HIDA scan. Fatty liver noted. ______

## 2016-01-14 NOTE — Progress Notes (Signed)
Cardiology Office Note   Date:  01/14/2016   ID:  Merrit, Domonkos Jul 02, 1966, MRN [id number]  PCP:  Raiford Simmonds., PA-C  Cardiologist: Woodroe Chen, NP   Chief Complaint  Patient presents with  . Palpitations      History of Present Illness: Richard [name] is a 50 y.o. male who presents for ongoing assessment and management of coronary artery disease, hypertension, chronic chest pain, COPD history with tobacco abuse, GERD, and diabetes. The patient did have a cardiac catheterization in January of 2017 revealing nonobstructive coronary artery disease with medical therapy cardiac risk factor reduction recommended. Smoking cessation was also strongly recommended.  This also being followed by psychiatry, Dr. [name], who was seen on 01/12/2016. He been feeling increasingly depressed and anxious. He was complaining of frequent palpitations unable to sleep or function. He was returned to cardiology for further evaluation of his palpitations.  He states he had 4 days of unrelenting rapid heart rhythm and palpitations beginning last Saturday and ending on Thursday afternoon around 2 PM. He said it kept him awake at night, he had shortness of breath, and he was unable to lay on his left side as it made it worse. He states that it stopped and he is now sore but can still feel his heart racing and palpating when he lies on his left side. He had some associated dizziness and mild shortness of breath.   Past Medical History  Diagnosis Date  . Chest pain     + palpitations; cath 2005- 30-40% mid LAD, 20% D1, 20% cx, OM, 20-30% RCA, and EF-55%  . COPD (chronic obstructive pulmonary disease) ([place])   . GERD (gastroesophageal reflux disease)   . Hyperlipemia   . Hypertension   . Depression   . Colitis 1990  . Tobacco abuse     1/2 pack per day  . Gastric ulcer 2003; 2012    2003: + esophagitis; negative H.pylori serology  2012: Dr. Oneida Alar, mild gastritis, Bravo PH probe  placement, negative H.pylori  . Hepatic steatosis   . Chronic low back pain   . Panic attacks   . Asthma   . Pneumonia 2000s X 2  . Type II diabetes mellitus ([place])   . History of hiatal hernia   . Arthritis     "legs, spine, shoulders" (08/12/2015)    Past Surgical History  Procedure Laterality Date  . Colonoscopy  1990  . Shoulder arthroscopy w/ rotator cuff repair Right 2006    acromioclavicular joint arthrosis  . Neck mass excision Right     "done in dr's office; behind right ear/side of ncek"  . Bravo ph study  05/03/2011    [id number] gastritis/normal esophagus and duodenum  . Esophagogastroduodenoscopy  05/03/2011    [id number] gastritis  . Cardiac catheterization  1990s X 1; 2005; 08/12/2015  . Cardiac catheterization N/A 08/12/2015    Procedure: Left Heart Cath and Coronary Angiography;  Surgeon: Belva Crome, MD; LAD 20%, CFX 65%, RCA 20%, EF 60%      Current Outpatient Prescriptions  Medication Sig Dispense Refill  . acetaminophen (TYLENOL) 325 MG tablet Take 650 mg by mouth every 6 (six) hours as needed for pain or fever.     [name] albuterol (VENTOLIN HFA) 108 (90 BASE) MCG/ACT inhaler Inhale 2 puffs into the lungs every 6 (six) hours as needed for wheezing or shortness of breath.     . ALPRAZolam (XANAX) 0.5 MG tablet Take one three times a day  and two at night 150 tablet 2  . amLODipine (NORVASC) 5 MG tablet Take 1 tablet (5 mg total) by mouth daily. 30 tablet 5  . aspirin 81 MG tablet Take 81 mg by mouth at bedtime.     . budesonide-formoterol (SYMBICORT) 80-4.5 MCG/ACT inhaler Take 2 puffs first thing in am and then another 2 puffs about 12 hours later.    . calcium carbonate (TUMS - DOSED IN MG ELEMENTAL CALCIUM) 500 MG chewable tablet Chew 1 tablet by mouth daily as needed for indigestion or heartburn.    . cyclobenzaprine (FLEXERIL) 10 MG tablet Take 1 tablet (10 mg total) by mouth 3 (three) times daily. 21 tablet 0  . esomeprazole (NEXIUM) 40 MG capsule Take 1 capsule  (40 mg total) by mouth 2 (two) times daily before a meal. 180 capsule 3  . HYDROcodone-acetaminophen (NORCO/VICODIN) 5-325 MG tablet Take 1 tablet by mouth every 4 (four) hours as needed. 15 tablet 0  . linaclotide (LINZESS) 145 MCG CAPS capsule Take 1 capsule (145 mcg total) by mouth daily before breakfast. 30 capsule 0  . lovastatin (MEVACOR) 20 MG tablet TAKE ONE TABLET BY MOUTH ONCE DAILY AT 6:OO PM 30 tablet 11  . metFORMIN (GLUCOPHAGE) 500 MG tablet Take 1 tablet (500 mg total) by mouth 2 (two) times daily with a meal. 30 tablet 2  . metoprolol succinate (TOPROL-XL) 50 MG 24 hr tablet Take 1 tablet (50 mg total) by mouth daily. Take with or immediately following a meal. 30 tablet 3  . nitroGLYCERIN (NITROSTAT) 0.4 MG SL tablet Place 1 tablet (0.4 mg total) under the tongue every 5 (five) minutes as needed for chest pain. 25 tablet 3  . PARoxetine (PAXIL) 20 MG tablet Take 1 tablet (20 mg total) by mouth at bedtime. 30 tablet 2   No current facility-administered medications for this visit.    Allergies:   Dexilant; Mushroom ext cmplx(shiitake-reishi-mait); and Penicillins    Social History:  The patient  reports that he has been smoking Cigarettes.  He started smoking about 33 years ago. He has a 12.5 pack-year smoking history. He has never used smokeless tobacco. He reports that he does not drink alcohol or use illicit drugs.   Family History:  The patient's family history includes ADD / ADHD in his daughter; Alcohol abuse in his father and mother; Anxiety disorder in his sister and sister; Dementia in his cousin and paternal uncle; Depression in his sister; Diabetes in his brother and father; Heart attack (age of onset: 92) in his brother; Heart attack (age of onset: 3) in his father; Hypertension in his brother, brother, and brother; Lung cancer in his mother; Seizures in his brother. There is no history of Colon cancer, Drug abuse, Bipolar disorder, OCD, Paranoid behavior, Schizophrenia,  Sexual abuse, or Physical abuse.    ROS: All other systems are reviewed and negative. Unless otherwise mentioned in H&P    PHYSICAL EXAM: VS:  BP 118/68 mmHg  Pulse 68  Ht 5\' 11"  (1.803 m)  Wt 206 lb (93.441 kg)  BMI 28.74 kg/m2  SpO2 95% , BMI Body mass index is 28.74 kg/(m^2). GEN: Well nourished, well developed, in no acute distress HEENT: normal Neck: no JVD, carotid bruits, or masses Cardiac: RRR; no murmurs, rubs, or gallops,no edema  Respiratory:  clear to auscultation bilaterally, normal work of breathing GI: soft, nontender, nondistended, + BS MS: no deformity or atrophy Skin: warm and dry, no rash Neuro:  Strength and sensation are intact Psych:  euthymic mood, full affect   EKG:  EKG ordered today. The ekg ordered today demonstrates normal sinus rhythm with nonspecific T-wave abnormalities no evidence of ACS, QRS duration 94 ms QTC 384 ms the patient had no evidence of delta waves. Heart rate was 67 beats per minute.   Recent Labs: 08/12/2015: BUN 12; Creatinine, Ser 0.89; Hemoglobin 15.7; Platelets 232; Potassium 4.2; Sodium 138 08/31/2015: ALT 25    Lipid Panel    Component Value Date/Time   CHOL 119 08/13/2015 0510   TRIG 142 08/13/2015 0510   HDL 27* 08/13/2015 0510   CHOLHDL 4.4 08/13/2015 0510   VLDL 28 08/13/2015 0510   LDLCALC 64 08/13/2015 0510      Wt Readings from Last 3 Encounters:  01/14/16 206 lb (93.441 kg)  01/12/16 205 lb 9.6 oz (93.26 kg)  12/24/15 205 lb 6.4 oz (93.169 kg)      Other studies Reviewed: Additional studies/ records that were reviewed today include: Echocardiogram.08/12/2015. Review of the above records demonstrates: Left ventricle: The cavity size was normal. There was moderate  focal basal hypertrophy of the septum and mild hypertrophy of the  posterior wall. Systolic function was normal. The estimated  ejection fraction was in the range of 60% to 65%. Wall motion was  normal; there were no regional wall motion  abnormalities. Left  ventricular diastolic function parameters were normal. - Right ventricle: The cavity size was normal. Wall thickness was  normal. Systolic function was normal. - Atrial septum: There was an atrial septal aneurysm. - Tricuspid valve: There was trivial regurgitation. - Inferior vena cava: The vessel was normal in size. The  respirophasic diameter changes were blunted (< 50%), consistent  with elevated central venous pressure.  ASSESSMENT AND PLAN:  1. Frequent palpitations: The patient states that they lasted 4 days subsiding on their own described as substernal pulsating and pumping with chest tightness.he states it usually happens when he lies on his left side as well. I had him do so in the exam room and had him wait approximately 5 minutes but did not have any palpitations at that time.  I will place a two-week cardiac monitor on the patient, also check a BMET for potassium level and magnesium level as he is on a PPI. The patient also is on Symbicort inhaler which he uses 2 puffs in the morning and 2 puffs in the evening 12 hours apart. The steroids may be stimulating elevated heart rates.  For now I will evaluate electrolyte imbalance and monitor heart rate, rhythm, frequency of palpitations and duration. He will followup in our office in one month. In the interim I have told him that his heart rate races whether he is wearing the monitor were not and does not subside on its own he could take an additional dose of metoprolol.   Current medicines are reviewed at length with the patient today.    Labs/ tests ordered today include: BMET, magnesium, cardiac monitor, and EKG. No orders of the defined types were placed in this encounter.     Disposition: FU with one month.   Signed, Jory Sims, NP  01/14/2016 1:43 PM    [facility] [address] [place], [place] [address] Phone: [phone]; Fax: [phone]

## 2016-01-14 NOTE — Progress Notes (Signed)
Quick Note:  Pt's wife is aware and Ok to schedule. ______

## 2016-01-14 NOTE — Progress Notes (Signed)
Name: Richard [name]    DOB: 11-30-1965  Age: 50 y.o.  MR#: [id number]       PCP:  Raiford Simmonds., PA-C      Insurance: Payor: MED PAY / Plan: MED PAY ASSURANCE / Product Type: *No Product type* /   CC:   No chief complaint on file.   VS Filed Vitals:   01/14/16 1321  Height: 5\' 11"  (1.803 m)  Weight: 206 lb (93.441 kg)    Weights Current Weight  01/14/16 206 lb (93.441 kg)  01/12/16 205 lb 9.6 oz (93.26 kg)  12/24/15 205 lb 6.4 oz (93.169 kg)    Blood Pressure  BP Readings from Last 3 Encounters:  01/12/16 137/73  12/24/15 121/77  12/01/15 116/73     Admit date:  (Not on file) Last encounter with RMR:  Visit date not found   Allergy Dexilant; Mushroom ext cmplx(shiitake-reishi-mait); and Penicillins  Current Outpatient Prescriptions  Medication Sig Dispense Refill  . acetaminophen (TYLENOL) 325 MG tablet Take 650 mg by mouth every 6 (six) hours as needed for pain or fever.     [name] albuterol (VENTOLIN HFA) 108 (90 BASE) MCG/ACT inhaler Inhale 2 puffs into the lungs every 6 (six) hours as needed for wheezing or shortness of breath.     . ALPRAZolam (XANAX) 0.5 MG tablet Take one three times a day and two at night 150 tablet 2  . amLODipine (NORVASC) 5 MG tablet Take 1 tablet (5 mg total) by mouth daily. 30 tablet 5  . aspirin 81 MG tablet Take 81 mg by mouth at bedtime.     . budesonide-formoterol (SYMBICORT) 80-4.5 MCG/ACT inhaler Take 2 puffs first thing in am and then another 2 puffs about 12 hours later.    . calcium carbonate (TUMS - DOSED IN MG ELEMENTAL CALCIUM) 500 MG chewable tablet Chew 1 tablet by mouth daily as needed for indigestion or heartburn.    . cyclobenzaprine (FLEXERIL) 10 MG tablet Take 1 tablet (10 mg total) by mouth 3 (three) times daily. 21 tablet 0  . esomeprazole (NEXIUM) 40 MG capsule Take 1 capsule (40 mg total) by mouth 2 (two) times daily before a meal. 180 capsule 3  . HYDROcodone-acetaminophen (NORCO/VICODIN) 5-325 MG tablet Take 1 tablet by  mouth every 4 (four) hours as needed. 15 tablet 0  . linaclotide (LINZESS) 145 MCG CAPS capsule Take 1 capsule (145 mcg total) by mouth daily before breakfast. 30 capsule 0  . lovastatin (MEVACOR) 20 MG tablet TAKE ONE TABLET BY MOUTH ONCE DAILY AT 6:OO PM 30 tablet 11  . metFORMIN (GLUCOPHAGE) 500 MG tablet Take 1 tablet (500 mg total) by mouth 2 (two) times daily with a meal. 30 tablet 2  . metoprolol succinate (TOPROL-XL) 50 MG 24 hr tablet Take 1 tablet (50 mg total) by mouth daily. Take with or immediately following a meal. 30 tablet 3  . nitroGLYCERIN (NITROSTAT) 0.4 MG SL tablet Place 1 tablet (0.4 mg total) under the tongue every 5 (five) minutes as needed for chest pain. 25 tablet 3  . PARoxetine (PAXIL) 20 MG tablet Take 1 tablet (20 mg total) by mouth at bedtime. 30 tablet 2   No current facility-administered medications for this visit.    Discontinued Meds:   There are no discontinued medications.  Patient Active Problem List   Diagnosis Date Noted  . Constipation 12/24/2015  . Dyspnea 11/17/2015  . Coronary artery disease due to lipid rich plaque   . Heart palpitations 08/12/2015  .  TIA (transient ischemic attack) 08/12/2015  . Colon cancer screening 08/02/2015  . Abdominal pain 12/18/2014  . Unspecified vitamin D deficiency 08/20/2012  . Arteriosclerotic cardiovascular disease (ASCVD) 04/11/2012  . Chronic low back pain   . Essential hypertension   . Anxiety and depression   . DIABETES MELLITUS-TYPE II 10/03/2010  . Cigarette smoker 09/14/2010  . CHRONIC OBSTRUCTIVE PULMONARY DISEASE 09/14/2010  . HLD (hyperlipidemia) 11/25/2009  . GASTROESOPHAGEAL REFLUX DISEASE 04/05/2009  . Hepatic steatosis 04/05/2009    LABS    Component Value Date/Time   NA 138 08/12/2015 0646   NA 132* 08/03/2015 1230   NA 139 10/19/2014 0907   NA 137 10/23/2012   K 4.2 08/12/2015 0646   K 4.6 08/03/2015 1230   K 4.4 10/19/2014 0907   K 4.3 10/23/2012   CL 101 08/12/2015 0646   CL  99 08/03/2015 1230   CL 104 10/19/2014 0907   CO2 25 08/12/2015 0646   CO2 26 08/03/2015 1230   CO2 26 10/19/2014 0907   CO2 25 10/23/2012   GLUCOSE 137* 08/12/2015 0646   GLUCOSE 179* 08/03/2015 1230   GLUCOSE 159* 10/19/2014 0907   BUN 12 08/12/2015 0646   BUN 15 08/03/2015 1230   BUN 15 10/19/2014 0907   BUN 15 10/23/2012   CREATININE 0.89 08/12/2015 0646   CREATININE 0.85 08/03/2015 1230   CREATININE 0.93 10/19/2014 0907   CREATININE 0.98 09/07/2014 2250   CREATININE 1.07 02/24/2013 1055   CREATININE 0.93 02/05/2013 1250   CALCIUM 9.2 08/12/2015 0646   CALCIUM 9.5 08/03/2015 1230   CALCIUM 8.9 10/19/2014 0907   CALCIUM 9.0 10/23/2012   GFRNONAA >60 08/12/2015 0646   GFRNONAA >90 09/07/2014 2250   GFRNONAA >90 02/05/2013 1250   GFRAA >60 08/12/2015 0646   GFRAA >90 09/07/2014 2250   GFRAA >90 02/05/2013 1250   CMP     Component Value Date/Time   NA 138 08/12/2015 0646   NA 137 10/23/2012   K 4.2 08/12/2015 0646   K 4.3 10/23/2012   CL 101 08/12/2015 0646   CO2 25 08/12/2015 0646   CO2 25 10/23/2012   GLUCOSE 137* 08/12/2015 0646   BUN 12 08/12/2015 0646   BUN 15 10/23/2012   CREATININE 0.89 08/12/2015 0646   CREATININE 0.85 08/03/2015 1230   CALCIUM 9.2 08/12/2015 0646   CALCIUM 9.0 10/23/2012   PROT 6.8 08/31/2015 1554   ALBUMIN 4.3 08/31/2015 1554   ALBUMIN 4.0 10/23/2012   AST 17 08/31/2015 1554   AST 11 10/23/2012   ALT 25 08/31/2015 1554   ALKPHOS 53 08/31/2015 1554   ALKPHOS 45 10/23/2012   BILITOT 0.3 08/31/2015 1554   BILITOT 0.4 10/23/2012   GFRNONAA >60 08/12/2015 0646   GFRAA >60 08/12/2015 0646       Component Value Date/Time   WBC 6.6 08/12/2015 0646   WBC 5.8 09/07/2014 2250   WBC 6.4 02/05/2013 1250   HGB 15.7 08/12/2015 0646   HGB 14.3 09/07/2014 2250   HGB 16.0 02/05/2013 1250   HCT 45.0 08/12/2015 0646   HCT 41.7 09/07/2014 2250   HCT 44.8 02/05/2013 1250   HCT 44 10/23/2012   MCV 90.2 08/12/2015 0646   MCV 90.7 09/07/2014  2250   MCV 89.2 02/05/2013 1250    Lipid Panel     Component Value Date/Time   CHOL 119 08/13/2015 0510   TRIG 142 08/13/2015 0510   HDL 27* 08/13/2015 0510   CHOLHDL 4.4 08/13/2015 0510   VLDL 28  08/13/2015 0510   LDLCALC 64 08/13/2015 0510    ABG    Component Value Date/Time   PHART 7.386 04/25/2011 1500   PCO2ART 41.8 04/25/2011 1500   PO2ART 87.0 04/25/2011 1500   HCO3 24.6* 04/25/2011 1500   TCO2 21.1 04/25/2011 1500   O2SAT 97.2 04/25/2011 1500     Lab Results  Component Value Date   TSH 0.958 04/21/2011   BNP (last 3 results) No results for input(s): BNP in the last 8760 hours.  ProBNP (last 3 results) No results for input(s): PROBNP in the last 8760 hours.  Cardiac Panel (last 3 results) No results for input(s): CKTOTAL, CKMB, TROPONINI, RELINDX in the last 72 hours.  Iron/TIBC/Ferritin/ %Sat No results found for: IRON, TIBC, FERRITIN, IRONPCTSAT   EKG Orders placed or performed during the hospital encounter of 08/12/15  . EKG 12-Lead  . EKG 12-Lead  . EKG 12-Lead (at 6am)  . EKG 12-Lead (at 6am)  . EKG 12-Lead  . EKG 12-Lead     Prior Assessment and Plan Problem List as of 01/14/2016      Cardiovascular and Mediastinum   Essential hypertension   Last Assessment & Plan 11/17/2015 Office Visit Written 11/17/2015  3:48 PM by Tanda Rockers, MD    In the best review of chronic cough to date ( NEJM 2016 375 [id number]) ,  ACEi are now felt to cause cough in up to  20% of pts which is a 4 fold increase from previous reports and does not include the variety of non-specific complaints we see in pulmonary clinic in pts on ACEi but previously attributed to another dx like  Copd/asthma and  include PNDS, throat and chest congestion, "bronchitis", unexplained dyspnea and noct "strangling" sensations, and hoarseness, but also  atypical /refractory GERD symptoms like dysphagia and "bad heartburn"   The only way I know  to prove this is not an "ACEi Case" is a trial off  ACEi x a minimum of 6 weeks then regroup.   Try diovan 80 mg daily in place of lisinopril       Arteriosclerotic cardiovascular disease (ASCVD)   Last Assessment & Plan 12/19/2012 Office Visit Written 12/19/2012  2:41 PM by Yehuda Savannah, MD    No symptoms at present to suggest progression of coronary disease. We will continue to control risk factors to the extent possible.      TIA (transient ischemic attack)   Coronary artery disease due to lipid rich plaque     Respiratory   CHRONIC OBSTRUCTIVE PULMONARY DISEASE   Last Assessment & Plan 05/27/2012 Office Visit Written 05/27/2012  7:57 PM by Yehuda Savannah, MD    Complete cessation of tobacco use is desirable, but patient does not feel that he can achieve this until anxiety is adequately treated.        Digestive   GASTROESOPHAGEAL REFLUX DISEASE   Last Assessment & Plan 12/24/2015 Office Visit Written 12/31/2015  1:56 PM by Orvil Feil, NP    Multiple non-GI complaints. GERD not ideally controlled due to dietary/behavior. Strict diet/behavior discussed. Continue Nexium each morning, Zantac in evening.       Hepatic steatosis   Last Assessment & Plan 10/14/2015 Office Visit Written 10/14/2015  9:16 AM by [name], MD    WEIGHT STABLE, BMI > 25  LOSE WEIGHT  LOW FAT DIET FOLLOW UP IN 6 MOS.       Constipation   Last Assessment & Plan 12/24/2015 Office Visit Written  12/31/2015  1:57 PM by Orvil Feil, NP    Trial Linzess 145 mcg once daily. Return in next few weeks to schedule initial screening colonoscopy. Patient continues to decline to arrange this due to multiple health issues and life events.         Endocrine   DIABETES MELLITUS-TYPE II   Last Assessment & Plan 10/31/2011 Office Visit Written 10/31/2011 12:48 PM by Yehuda Savannah, MD    A1c reportedly excellent a few months ago.  Patient advised to discontinue intake of sweet beverages, continue to lose weight and to monitor his CBGs occasionally        Other    HLD (hyperlipidemia)   Last Assessment & Plan 12/19/2012 Office Visit Written 12/19/2012  2:45 PM by Yehuda Savannah, MD    No lipid profile obtained within the past year. A repeat assessment of serum lipids will be undertaken.      Cigarette smoker   Last Assessment & Plan 11/17/2015 Office Visit Written 11/17/2015  3:48 PM by Tanda Rockers, MD    > 3 min discussion I reviewed the Fletcher curve with the patient that basically indicates  if you quit smoking when your best day FEV1 is still well preserved (as is clearly  the case here)  it is highly unlikely you will progress to severe disease and informed the patient there was no medication on the market that has proven to alter the curve/ its downward trajectory  or the likelihood of progression of their disease.  Therefore stopping smoking and maintaining abstinence is the most important aspect of care, not choice of inhalers or for that matter, doctors.        Anxiety and depression   Last Assessment & Plan 12/19/2012 Office Visit Written 12/19/2012  2:40 PM by Yehuda Savannah, MD    Patient is not reporting much in the way of psychological distress of late, perhaps as a result of his multiple physical ailments.  He continues to receive care from behavioral health.      Chronic low back pain   Last Assessment & Plan 12/04/2012 Office Visit Written 12/04/2012 10:32 AM by [name], MD    PT HAS ACUTE FLARE IN L LOWER LUMBAR REGION(PARA-SPINOUS). DIFFERENTIAL DIAGNOSIS INCLUDES EARLY ONSET SHINGLES OR MS STRAIN.  ICE TO PAINFUL AREA TID FOR 10 MINS FOR 7 DAYS IBUPROFEN TID FOR 7 DAYS OPV W/ FREE CLINIC IF SX NOT IMPROVED.      Unspecified vitamin D deficiency   Abdominal pain   Last Assessment & Plan 12/09/2014 Office Visit Written 12/18/2014  9:43 PM by Orvil Feil, NP    50 year old male with chronic RUQ pain, gallbladder remains in situ, last EGD in 2012 with gastritis. No dysphagia. May need repeat EGD with further evaluation for  biliary etiology if negative. Unable to proceed at this time but will return in 6 weeks (at time of wife's appt as well) to discuss further. Will trial Dexilant samples in interim, as Nexium is no longer covered via patient assistance.       Colon cancer screening   Last Assessment & Plan 10/14/2015 Office Visit Written 10/14/2015  9:17 AM by [name], MD    AVERAGE RISK  TCS IN 2017      Heart palpitations   Dyspnea   Last Assessment & Plan 11/17/2015 Office Visit Written 11/17/2015  3:46 PM by Tanda Rockers, MD    Spirometry 05/05/15   FEV1 3.47 (85%)  Ratio 74  - 11/17/2015  After extensive coaching HFA effectiveness =    75% > try symb 80 2bid and stop advair  - Try off advair 11/17/2015 >>>   DDX of  difficult airways management almost all start with A and  include Adherence, Ace Inhibitors, Acid Reflux, Active Sinus Disease, Alpha 1 Antitripsin deficiency, Anxiety masquerading as Airways dz,  ABPA,  Allergy(esp in young), Aspiration (esp in elderly), Adverse effects of meds,  Active smokers, A bunch of PE's (a small clot burden can't cause this syndrome unless there is already severe underlying pulm or vascular dz with poor reserve) plus two Bs  = Bronchiectasis and Beta blocker use..and one C= CHF  Adherence is always the initial "prime suspect" and is a multilayered concern that requires a "trust but verify" approach in every patient - starting with knowing how to use medications, especially inhalers, correctly, keeping up with refills and understanding the fundamental difference between maintenance and prns vs those medications only taken for a very short course and then stopped and not refilled.  - The proper method of use, as well as anticipated side effects, of a metered-dose inhaler are discussed and demonstrated to the patient. Improved effectiveness after extensive coaching during this visit to a level of approximately 75 % from a baseline of 50 %   Active smoking (see separate  a/p)    ? Adverse effect of advair > d/c  ? acei case (see separate a/p)   ? Acid (or non-acid) GERD > always difficult to exclude as up to 75% of pts in some series report no assoc GI/ Heartburn symptoms> rec continue max (24h)  acid suppression and diet restrictions/ reviewed     ? Anxiety > dx of exclusion   ? Allergy/asthma > symb 80 2bid should cover this  ? BB effect > doubt s more evidence of airflow obst on exam on spirometery esp with relatively low dose toprol  Total time devoted to counseling  = 35/37m review case with pt/ discussion of options/alternatives/ personally creating in presence of pt  then going over specific  Instructions directly with the pt including how to use all of the meds but in particular covering each new medication in detail (see avs)              Imaging: US Abdomen Limited Ruq  01/14/2016  CLINICAL DATA:  Right upper quadrant pain. EXAM: US ABDOMEN LIMITED - RIGHT UPPER QUADRANT COMPARISON:  11/20/2012. FINDINGS: Gallbladder: No gallstones or wall thickening visualized. No sonographic Murphy sign noted by sonographer. Common bile duct: Diameter: 2.7 mm Liver: The liver is echogenic consistent with fatty infiltration and/or hepatocellular disease. No focal hepatic abnormality identified. IMPRESSION: The liver is echogenic consistent fatty infiltration and/or hepatocellular disease. No focal hepatic abnormality identified. No gallstones or biliary distention. Electronically Signed   By: Marcello Moores  Register   On: 01/14/2016 08:43

## 2016-01-14 NOTE — Patient Instructions (Signed)
Your physician recommends that you schedule a follow-up appointment in: 1 Month with Jory Sims, NP.   Your physician recommends that you continue on your current medications as directed. Please refer to the Current Medication list given to you today.  Your physician recommends that you have lab work done today.  Your physician has recommended that you wear an event monitor. Event monitors are medical devices that record the heart's electrical activity. Doctors most often [name] these monitors to diagnose arrhythmias. Arrhythmias are problems with the speed or rhythm of the heartbeat. The monitor is a small, portable device. You can wear one while you do your normal daily activities. This is usually used to diagnose what is causing palpitations/syncope (passing out).  If you need a refill on your cardiac medications before your next appointment, please call your pharmacy.  Thank you for choosing [facility]!

## 2016-01-19 ENCOUNTER — Telehealth: Payer: Self-pay | Admitting: Cardiovascular Disease

## 2016-01-19 DIAGNOSIS — Z79899 Other long term (current) drug therapy: Secondary | ICD-10-CM

## 2016-01-19 NOTE — Telephone Encounter (Signed)
Dr. Harl Bowie informed me that patient has had episodic atrial fibrillation, a new diagnosis for him. Due to CHADSVASc score of 2, will need anticoagulation. Start Xarelto 20 mg daily and check CBC. Have him fu with Arnold Long next week.

## 2016-01-20 ENCOUNTER — Encounter (HOSPITAL_COMMUNITY): Payer: Self-pay

## 2016-01-20 ENCOUNTER — Other Ambulatory Visit (HOSPITAL_COMMUNITY)
Admission: RE | Admit: 2016-01-20 | Discharge: 2016-01-20 | Disposition: A | Discharge status: Home / Self Care | Payer: No Typology Code available for payment source | Source: Ambulatory Visit | Attending: Cardiovascular Disease | Admitting: Cardiovascular Disease

## 2016-01-20 ENCOUNTER — Encounter (HOSPITAL_COMMUNITY)
Admission: RE | Admit: 2016-01-20 | Discharge: 2016-01-20 | Disposition: A | Discharge status: Home / Self Care | Payer: Self-pay | Source: Ambulatory Visit | Attending: Gastroenterology | Admitting: Gastroenterology

## 2016-01-20 DIAGNOSIS — R1011 Right upper quadrant pain: Secondary | ICD-10-CM | POA: Insufficient documentation

## 2016-01-20 DIAGNOSIS — Z5181 Encounter for therapeutic drug level monitoring: Secondary | ICD-10-CM | POA: Insufficient documentation

## 2016-01-20 DIAGNOSIS — Z79899 Other long term (current) drug therapy: Secondary | ICD-10-CM | POA: Insufficient documentation

## 2016-01-20 LAB — CBC WITH DIFFERENTIAL/PLATELET
BASOS ABS: 0 10*3/uL (ref 0.0–0.1)
Basophils Relative: 0 %
Eosinophils Absolute: 0.2 10*3/uL (ref 0.0–0.7)
Eosinophils Relative: 2 %
HEMATOCRIT: 45.5 % (ref 39.0–52.0)
Hemoglobin: 16 g/dL (ref 13.0–17.0)
LYMPHS PCT: 27 %
Lymphs Abs: 2.1 10*3/uL (ref 0.7–4.0)
MCH: 31.7 pg (ref 26.0–34.0)
MCHC: 35.2 g/dL (ref 30.0–36.0)
MCV: 90.1 fL (ref 78.0–100.0)
MONO ABS: 0.6 10*3/uL (ref 0.1–1.0)
Monocytes Relative: 8 %
NEUTROS ABS: 4.8 10*3/uL (ref 1.7–7.7)
Neutrophils Relative %: 63 %
PLATELETS: 189 10*3/uL (ref 150–400)
RBC: 5.05 MIL/uL (ref 4.22–5.81)
RDW: 13.1 % (ref 11.5–15.5)
WBC: 7.6 10*3/uL (ref 4.0–10.5)

## 2016-01-20 MED ORDER — SINCALIDE 5 MCG IJ SOLR
INTRAMUSCULAR | Status: AC
  Administered 2016-01-20: 1.85 ug via INTRAVENOUS
  Filled 2016-01-20: qty 5

## 2016-01-20 MED ORDER — TECHNETIUM TC 99M MEBROFENIN IV KIT
5.0000 | PACK | Freq: Once | INTRAVENOUS | Status: AC | PRN
  Administered 2016-01-20: 5 via INTRAVENOUS

## 2016-01-20 MED ORDER — SODIUM CHLORIDE 0.9% FLUSH
INTRAVENOUS | Status: AC
  Filled 2016-01-20: qty 40

## 2016-01-20 MED ORDER — STERILE WATER FOR INJECTION IJ SOLN
INTRAMUSCULAR | Status: AC
  Administered 2016-01-20: 1.85 mL via INTRAVENOUS
  Filled 2016-01-20: qty 10

## 2016-01-20 MED ORDER — RIVAROXABAN 20 MG PO TABS: 20.0000 mg | ORAL_TABLET | Freq: Every day | ORAL | Status: DC

## 2016-01-20 NOTE — Telephone Encounter (Signed)
Spoke to pt's wife. Informed her that pt needs to start xarelto 20 mg daily and will need blood work done. I have given him samples of xarelto and put lab order in.

## 2016-01-20 NOTE — Addendum Note (Signed)
Addended by: Debbora Lacrosse R on: 01/20/2016 09:58 AM   Modules accepted: Orders

## 2016-01-20 NOTE — Telephone Encounter (Signed)
xarelto 20 mg  Lot- [id number]  Samples given 7/6- lm

## 2016-01-24 ENCOUNTER — Telehealth: Payer: Self-pay | Admitting: Cardiovascular Disease

## 2016-01-24 NOTE — Telephone Encounter (Signed)
Returning someones call 

## 2016-01-24 NOTE — Telephone Encounter (Signed)
Let pt know lab work was normal. Reminded him of his appt. For 7.14@ 3:10.

## 2016-01-26 NOTE — Progress Notes (Signed)
Quick Note:  Pt is aware. ______ 

## 2016-01-26 NOTE — Progress Notes (Signed)
Quick Note:  Normal HIDA. We have ruled out biliary etiology. Return as planned. ______

## 2016-01-28 ENCOUNTER — Encounter: Payer: Self-pay | Admitting: Adult Health

## 2016-01-28 ENCOUNTER — Ambulatory Visit (INDEPENDENT_AMBULATORY_CARE_PROVIDER_SITE_OTHER): Payer: Self-pay | Admitting: Adult Health

## 2016-01-28 VITALS — BP 118/78 | HR 66 | Ht 71.0 in | Wt 203.0 lb

## 2016-01-28 DIAGNOSIS — I10 Essential (primary) hypertension: Secondary | ICD-10-CM

## 2016-01-28 DIAGNOSIS — I480 Paroxysmal atrial fibrillation: Secondary | ICD-10-CM

## 2016-01-28 MED ORDER — AMLODIPINE BESYLATE 2.5 MG PO TABS: 2.5000 mg | ORAL_TABLET | Freq: Every day | ORAL | Status: DC

## 2016-01-28 NOTE — Patient Instructions (Signed)
Your physician recommends that you schedule a follow-up appointment in: 3 Months with Dr. Bronson Ing.   Your physician has recommended you make the following change in your medication:   Decrease Norvasc to 2.5 mg Daily  Call [facility] for Medication Assistance @ [phone]  If you need a refill on your cardiac medications before your next appointment, please call your pharmacy.  Thank you for choosing [facility]!

## 2016-01-28 NOTE — Progress Notes (Signed)
Cardiology Office Note   Date:  01/28/2016   ID:  Richard [name], DOB Jun 19, 1966, MRN [id number]  PCP:  Raiford Simmonds., PA-C  Cardiologist: Woodroe Chen, NP   No chief complaint on file.     History of Present Illness: Richard [name] is a 50 y.o. male who presents for ongoing assessment and management of coronary artery disease, hypertension, chronic chest pain, with history of ongoing tobacco abuse, COPD, GERD, and diabetes. As recent cardiac catheterization was completed in January 2007 revealing nonobstructive coronary artery disease with medical therapy recommended.  He was last seen in the office on 01/14/2016,a cardiac monitor was placed to evaluate for frequency of palpitations which she had been experiencing.cardiac monitor revealed episodic atrial fibrillation. CHADS VASc Score of 2, and he was started on Xarelto 20 mg daily.samples were provided, and CBC was ordered.  Labs on 01/20/2016 revealed hemoglobin of 16.0 and hematocrit 45.5. Platelets 189.  He comes today feeling much better. He feel asleep now and he states that the racing heart rate and palpitations has almost been completely eliminated. He is feeling it rarely now and very transiently. He is very grateful to our help concerning identifying it and the medications. He has been taking his metoprolol as directed along with the relative oh without any complaints of bleeding bruising or stomach upset.his only complaint is dizziness when bending over or after sitting for long periods of time.  Past Medical History  Diagnosis Date  . Chest pain     + palpitations; cath 2005- 30-40% mid LAD, 20% D1, 20% cx, OM, 20-30% RCA, and EF-55%  . COPD (chronic obstructive pulmonary disease) ([place])   . GERD (gastroesophageal reflux disease)   . Hyperlipemia   . Hypertension   . Depression   . Colitis 1990  . Tobacco abuse     1/2 pack per day  . Gastric ulcer 2003; 2012    2003: + esophagitis; negative H.pylori  serology  2012: Dr. Oneida Alar, mild gastritis, Bravo PH probe placement, negative H.pylori  . Hepatic steatosis   . Chronic low back pain   . Panic attacks   . Asthma   . Pneumonia 2000s X 2  . Type II diabetes mellitus ([place])   . History of hiatal hernia   . Arthritis     "legs, spine, shoulders" (08/12/2015)    Past Surgical History  Procedure Laterality Date  . Colonoscopy  1990  . Shoulder arthroscopy w/ rotator cuff repair Right 2006    acromioclavicular joint arthrosis  . Neck mass excision Right     "done in dr's office; behind right ear/side of ncek"  . Bravo ph study  05/03/2011    [id number] gastritis/normal esophagus and duodenum  . Esophagogastroduodenoscopy  05/03/2011    [id number] gastritis  . Cardiac catheterization  1990s X 1; 2005; 08/12/2015  . Cardiac catheterization N/A 08/12/2015    Procedure: Left Heart Cath and Coronary Angiography;  Surgeon: Belva Crome, MD; LAD 20%, CFX 65%, RCA 20%, EF 60%      Current Outpatient Prescriptions  Medication Sig Dispense Refill  . acetaminophen (TYLENOL) 325 MG tablet Take 650 mg by mouth every 6 (six) hours as needed for pain or fever.     [name] albuterol (VENTOLIN HFA) 108 (90 BASE) MCG/ACT inhaler Inhale 2 puffs into the lungs every 6 (six) hours as needed for wheezing or shortness of breath.     . ALPRAZolam (XANAX) 0.5 MG tablet Take one three times a day and  two at night 150 tablet 2  . amLODipine (NORVASC) 5 MG tablet Take 1 tablet (5 mg total) by mouth daily. 30 tablet 5  . aspirin 81 MG tablet Take 81 mg by mouth at bedtime.     . budesonide-formoterol (SYMBICORT) 80-4.5 MCG/ACT inhaler Take 2 puffs first thing in am and then another 2 puffs about 12 hours later.    . calcium carbonate (TUMS - DOSED IN MG ELEMENTAL CALCIUM) 500 MG chewable tablet Chew 1 tablet by mouth daily as needed for indigestion or heartburn.    . cyclobenzaprine (FLEXERIL) 10 MG tablet Take 1 tablet (10 mg total) by mouth 3 (three) times daily. 21  tablet 0  . esomeprazole (NEXIUM) 40 MG capsule Take 1 capsule (40 mg total) by mouth 2 (two) times daily before a meal. 180 capsule 3  . HYDROcodone-acetaminophen (NORCO/VICODIN) 5-325 MG tablet Take 1 tablet by mouth every 4 (four) hours as needed. 15 tablet 0  . linaclotide (LINZESS) 145 MCG CAPS capsule Take 1 capsule (145 mcg total) by mouth daily before breakfast. 30 capsule 0  . lovastatin (MEVACOR) 20 MG tablet TAKE ONE TABLET BY MOUTH ONCE DAILY AT 6:OO PM 30 tablet 11  . metFORMIN (GLUCOPHAGE) 500 MG tablet Take 1 tablet (500 mg total) by mouth 2 (two) times daily with a meal. 30 tablet 2  . metoprolol succinate (TOPROL-XL) 50 MG 24 hr tablet Take 1 tablet (50 mg total) by mouth daily. Take with or immediately following a meal. 30 tablet 3  . nitroGLYCERIN (NITROSTAT) 0.4 MG SL tablet Place 1 tablet (0.4 mg total) under the tongue every 5 (five) minutes as needed for chest pain. 25 tablet 3  . PARoxetine (PAXIL) 20 MG tablet Take 1 tablet (20 mg total) by mouth at bedtime. 30 tablet 2  . rivaroxaban (XARELTO) 20 MG TABS tablet Take 1 tablet (20 mg total) by mouth daily with supper. 30 tablet 3   No current facility-administered medications for this visit.    Allergies:   Dexilant; Mushroom ext cmplx(shiitake-reishi-mait); and Penicillins    Social History:  The patient  reports that he has been smoking Cigarettes.  He started smoking about 33 years ago. He has a 12.5 pack-year smoking history. He has never used smokeless tobacco. He reports that he does not drink alcohol or use illicit drugs.   Family History:  The patient's family history includes ADD / ADHD in his daughter; Alcohol abuse in his father and mother; Anxiety disorder in his sister and sister; Dementia in his cousin and paternal uncle; Depression in his sister; Diabetes in his brother and father; Heart attack (age of onset: 6) in his brother; Heart attack (age of onset: 27) in his father; Hypertension in his brother,  brother, and brother; Lung cancer in his mother; Seizures in his brother. There is no history of Colon cancer, Drug abuse, Bipolar disorder, OCD, Paranoid behavior, Schizophrenia, Sexual abuse, or Physical abuse.    ROS: All other systems are reviewed and negative. Unless otherwise mentioned in H&P    PHYSICAL EXAM: VS:  There were no vitals taken for this visit. , BMI There is no weight on file to calculate BMI. GEN: Well nourished, well developed, in no acute distress HEENT: normal Neck: no JVD, carotid bruits, or masses Cardiac: RRR; no murmurs, rubs, or gallops,no edema  Respiratory:  clear to auscultation bilaterally, normal work of breathing GI: soft, nontender, nondistended, + BS MS: no deformity or atrophy Skin: warm and dry, no rash Neuro:  Strength and sensation are intact Psych: euthymic mood, full affect    Recent Labs: 08/31/2015: ALT 25 01/14/2016: BUN 16; Creatinine, Ser 0.94; Magnesium 1.9; Potassium 3.8; Sodium 136 01/20/2016: Hemoglobin 16.0; Platelets 189    Lipid Panel    Component Value Date/Time   CHOL 119 08/13/2015 0510   TRIG 142 08/13/2015 0510   HDL 27* 08/13/2015 0510   CHOLHDL 4.4 08/13/2015 0510   VLDL 28 08/13/2015 0510   LDLCALC 64 08/13/2015 0510      Wt Readings from Last 3 Encounters:  01/14/16 206 lb (93.441 kg)  01/12/16 205 lb 9.6 oz (93.26 kg)  12/24/15 205 lb 6.4 oz (93.169 kg)     ASSESSMENT AND PLAN:  1. Paroxysmal atrial fibrillation: The patient will continue on metoprolol 50 mg, the relative, as he is relatively asymptomatic now. CBC is been reviewed and there is no evidence of bleeding or anemia. The patient has been given medication assistance forms to fill out to be able to continue to take Xarelto at reduced cost.  2. Hypertension:blood pressure is soft and he is complaining of some dizziness when bending over or getting up from a seated position. Although decrease amlodipine from 5 mg daily to 2.5 mg daily. We'll see him  again in 3 months unless he is symptomatic.  3. Chronic anxiety and depression: with improvement and palpitations he appears to be happier and less anxious today. He'll continue his current medication regimen for depression to include Paxil and alprazolam. Followed by primary care provider.   Current medicines are reviewed at length with the patient today.    Labs/ tests ordered today include:  No orders of the defined types were placed in this encounter.     Disposition:   FU with cardiology in 3 months.  Signed, Jory Sims, NP  01/28/2016 7:36 AM    [facility] [address] [place], [place] [address] Phone: [phone]; Fax: [phone]

## 2016-01-28 NOTE — Progress Notes (Signed)
Name: Richard [name]    DOB: 1966/03/23  Age: 50 y.o.  MR#: [id number]       PCP:  Raiford Simmonds., PA-C      Insurance: Payor: MED PAY / Plan: MED PAY ASSURANCE / Product Type: *No Product type* /   CC:   No chief complaint on file.   VS Filed Vitals:   01/28/16 1520  Pulse: 66  Height: 5\' 11"  (1.803 m)  Weight: 203 lb (92.08 kg)  SpO2: 97%    Weights Current Weight  01/28/16 203 lb (92.08 kg)  01/14/16 206 lb (93.441 kg)  01/12/16 205 lb 9.6 oz (93.26 kg)    Blood Pressure  BP Readings from Last 3 Encounters:  01/14/16 118/68  01/12/16 137/73  12/24/15 121/77     Admit date:  (Not on file) Last encounter with RMR:  01/14/2016   Allergy Dexilant; Mushroom ext cmplx(shiitake-reishi-mait); and Penicillins  Current Outpatient Prescriptions  Medication Sig Dispense Refill  . acetaminophen (TYLENOL) 325 MG tablet Take 650 mg by mouth every 6 (six) hours as needed for pain or fever.     [name] albuterol (VENTOLIN HFA) 108 (90 BASE) MCG/ACT inhaler Inhale 2 puffs into the lungs every 6 (six) hours as needed for wheezing or shortness of breath.     . ALPRAZolam (XANAX) 0.5 MG tablet Take one three times a day and two at night 150 tablet 2  . amLODipine (NORVASC) 5 MG tablet Take 1 tablet (5 mg total) by mouth daily. 30 tablet 5  . aspirin 81 MG tablet Take 81 mg by mouth at bedtime.     . budesonide-formoterol (SYMBICORT) 80-4.5 MCG/ACT inhaler Take 2 puffs first thing in am and then another 2 puffs about 12 hours later.    . calcium carbonate (TUMS - DOSED IN MG ELEMENTAL CALCIUM) 500 MG chewable tablet Chew 1 tablet by mouth daily as needed for indigestion or heartburn.    . cyclobenzaprine (FLEXERIL) 10 MG tablet Take 1 tablet (10 mg total) by mouth 3 (three) times daily. 21 tablet 0  . esomeprazole (NEXIUM) 40 MG capsule Take 1 capsule (40 mg total) by mouth 2 (two) times daily before a meal. 180 capsule 3  . HYDROcodone-acetaminophen (NORCO/VICODIN) 5-325 MG tablet Take 1 tablet  by mouth every 4 (four) hours as needed. 15 tablet 0  . linaclotide (LINZESS) 145 MCG CAPS capsule Take 1 capsule (145 mcg total) by mouth daily before breakfast. 30 capsule 0  . lovastatin (MEVACOR) 20 MG tablet TAKE ONE TABLET BY MOUTH ONCE DAILY AT 6:OO PM 30 tablet 11  . metFORMIN (GLUCOPHAGE) 500 MG tablet Take 1 tablet (500 mg total) by mouth 2 (two) times daily with a meal. 30 tablet 2  . metoprolol succinate (TOPROL-XL) 50 MG 24 hr tablet Take 1 tablet (50 mg total) by mouth daily. Take with or immediately following a meal. 30 tablet 3  . nitroGLYCERIN (NITROSTAT) 0.4 MG SL tablet Place 1 tablet (0.4 mg total) under the tongue every 5 (five) minutes as needed for chest pain. 25 tablet 3  . PARoxetine (PAXIL) 20 MG tablet Take 1 tablet (20 mg total) by mouth at bedtime. 30 tablet 2  . rivaroxaban (XARELTO) 20 MG TABS tablet Take 1 tablet (20 mg total) by mouth daily with supper. 30 tablet 3   No current facility-administered medications for this visit.    Discontinued Meds:   There are no discontinued medications.  Patient Active Problem List   Diagnosis Date Noted  .  Constipation 12/24/2015  . Dyspnea 11/17/2015  . Coronary artery disease due to lipid rich plaque   . Heart palpitations 08/12/2015  . TIA (transient ischemic attack) 08/12/2015  . Colon cancer screening 08/02/2015  . Abdominal pain 12/18/2014  . Unspecified vitamin D deficiency 08/20/2012  . Arteriosclerotic cardiovascular disease (ASCVD) 04/11/2012  . Chronic low back pain   . Essential hypertension   . Anxiety and depression   . DIABETES MELLITUS-TYPE II 10/03/2010  . Cigarette smoker 09/14/2010  . CHRONIC OBSTRUCTIVE PULMONARY DISEASE 09/14/2010  . HLD (hyperlipidemia) 11/25/2009  . GASTROESOPHAGEAL REFLUX DISEASE 04/05/2009  . Hepatic steatosis 04/05/2009    LABS    Component Value Date/Time   NA 136 01/14/2016 1431   NA 138 08/12/2015 0646   NA 132* 08/03/2015 1230   NA 137 10/23/2012   K 3.8  01/14/2016 1431   K 4.2 08/12/2015 0646   K 4.6 08/03/2015 1230   K 4.3 10/23/2012   CL 105 01/14/2016 1431   CL 101 08/12/2015 0646   CL 99 08/03/2015 1230   CO2 25 01/14/2016 1431   CO2 25 08/12/2015 0646   CO2 26 08/03/2015 1230   CO2 25 10/23/2012   GLUCOSE 128* 01/14/2016 1431   GLUCOSE 137* 08/12/2015 0646   GLUCOSE 179* 08/03/2015 1230   BUN 16 01/14/2016 1431   BUN 12 08/12/2015 0646   BUN 15 08/03/2015 1230   BUN 15 10/23/2012   CREATININE 0.94 01/14/2016 1431   CREATININE 0.89 08/12/2015 0646   CREATININE 0.85 08/03/2015 1230   CREATININE 0.93 10/19/2014 0907   CREATININE 0.98 09/07/2014 2250   CREATININE 1.07 02/24/2013 1055   CALCIUM 8.7* 01/14/2016 1431   CALCIUM 9.2 08/12/2015 0646   CALCIUM 9.5 08/03/2015 1230   CALCIUM 9.0 10/23/2012   GFRNONAA >60 01/14/2016 1431   GFRNONAA >60 08/12/2015 0646   GFRNONAA >90 09/07/2014 2250   GFRAA >60 01/14/2016 1431   GFRAA >60 08/12/2015 0646   GFRAA >90 09/07/2014 2250   CMP     Component Value Date/Time   NA 136 01/14/2016 1431   NA 137 10/23/2012   K 3.8 01/14/2016 1431   K 4.3 10/23/2012   CL 105 01/14/2016 1431   CO2 25 01/14/2016 1431   CO2 25 10/23/2012   GLUCOSE 128* 01/14/2016 1431   BUN 16 01/14/2016 1431   BUN 15 10/23/2012   CREATININE 0.94 01/14/2016 1431   CREATININE 0.85 08/03/2015 1230   CALCIUM 8.7* 01/14/2016 1431   CALCIUM 9.0 10/23/2012   PROT 6.8 08/31/2015 1554   ALBUMIN 4.3 08/31/2015 1554   ALBUMIN 4.0 10/23/2012   AST 17 08/31/2015 1554   AST 11 10/23/2012   ALT 25 08/31/2015 1554   ALKPHOS 53 08/31/2015 1554   ALKPHOS 45 10/23/2012   BILITOT 0.3 08/31/2015 1554   BILITOT 0.4 10/23/2012   GFRNONAA >60 01/14/2016 1431   GFRAA >60 01/14/2016 1431       Component Value Date/Time   WBC 7.6 01/20/2016 1026   WBC 6.6 08/12/2015 0646   WBC 5.8 09/07/2014 2250   HGB 16.0 01/20/2016 1026   HGB 15.7 08/12/2015 0646   HGB 14.3 09/07/2014 2250   HCT 45.5 01/20/2016 1026   HCT  45.0 08/12/2015 0646   HCT 41.7 09/07/2014 2250   HCT 44 10/23/2012   MCV 90.1 01/20/2016 1026   MCV 90.2 08/12/2015 0646   MCV 90.7 09/07/2014 2250    Lipid Panel     Component Value Date/Time   CHOL 119  08/13/2015 0510   TRIG 142 08/13/2015 0510   HDL 27* 08/13/2015 0510   CHOLHDL 4.4 08/13/2015 0510   VLDL 28 08/13/2015 0510   LDLCALC 64 08/13/2015 0510    ABG    Component Value Date/Time   PHART 7.386 04/25/2011 1500   PCO2ART 41.8 04/25/2011 1500   PO2ART 87.0 04/25/2011 1500   HCO3 24.6* 04/25/2011 1500   TCO2 21.1 04/25/2011 1500   O2SAT 97.2 04/25/2011 1500     Lab Results  Component Value Date   TSH 0.958 04/21/2011   BNP (last 3 results) No results for input(s): BNP in the last 8760 hours.  ProBNP (last 3 results) No results for input(s): PROBNP in the last 8760 hours.  Cardiac Panel (last 3 results) No results for input(s): CKTOTAL, CKMB, TROPONINI, RELINDX in the last 72 hours.  Iron/TIBC/Ferritin/ %Sat No results found for: IRON, TIBC, FERRITIN, IRONPCTSAT   EKG Orders placed or performed in visit on 01/14/16  . EKG 12-Lead     Prior Assessment and Plan Problem List as of 01/28/2016      Cardiovascular and Mediastinum   Essential hypertension   Last Assessment & Plan 11/17/2015 Office Visit Written 11/17/2015  3:48 PM by Tanda Rockers, MD    In the best review of chronic cough to date ( NEJM 2016 375 [phone]) ,  ACEi are now felt to cause cough in up to  20% of pts which is a 4 fold increase from previous reports and does not include the variety of non-specific complaints we see in pulmonary clinic in pts on ACEi but previously attributed to another dx like  Copd/asthma and  include PNDS, throat and chest congestion, "bronchitis", unexplained dyspnea and noct "strangling" sensations, and hoarseness, but also  atypical /refractory GERD symptoms like dysphagia and "bad heartburn"   The only way I know  to prove this is not an "ACEi Case" is a trial  off ACEi x a minimum of 6 weeks then regroup.   Try diovan 80 mg daily in place of lisinopril       Arteriosclerotic cardiovascular disease (ASCVD)   Last Assessment & Plan 12/19/2012 Office Visit Written 12/19/2012  2:41 PM by Yehuda Savannah, MD    No symptoms at present to suggest progression of coronary disease. We will continue to control risk factors to the extent possible.      TIA (transient ischemic attack)   Coronary artery disease due to lipid rich plaque     Respiratory   CHRONIC OBSTRUCTIVE PULMONARY DISEASE   Last Assessment & Plan 05/27/2012 Office Visit Written 05/27/2012  7:57 PM by Yehuda Savannah, MD    Complete cessation of tobacco use is desirable, but patient does not feel that he can achieve this until anxiety is adequately treated.        Digestive   GASTROESOPHAGEAL REFLUX DISEASE   Last Assessment & Plan 12/24/2015 Office Visit Written 12/31/2015  1:56 PM by Orvil Feil, NP    Multiple non-GI complaints. GERD not ideally controlled due to dietary/behavior. Strict diet/behavior discussed. Continue Nexium each morning, Zantac in evening.       Hepatic steatosis   Last Assessment & Plan 10/14/2015 Office Visit Written 10/14/2015  9:16 AM by [name], MD    WEIGHT STABLE, BMI > 25  LOSE WEIGHT  LOW FAT DIET FOLLOW UP IN 6 MOS.       Constipation   Last Assessment & Plan 12/24/2015 Office Visit Written 12/31/2015  1:57 PM by Orvil Feil, NP    Trial Linzess 145 mcg once daily. Return in next few weeks to schedule initial screening colonoscopy. Patient continues to decline to arrange this due to multiple health issues and life events.         Endocrine   DIABETES MELLITUS-TYPE II   Last Assessment & Plan 10/31/2011 Office Visit Written 10/31/2011 12:48 PM by Yehuda Savannah, MD    A1c reportedly excellent a few months ago.  Patient advised to discontinue intake of sweet beverages, continue to lose weight and to monitor his CBGs occasionally         Other   HLD (hyperlipidemia)   Last Assessment & Plan 12/19/2012 Office Visit Written 12/19/2012  2:45 PM by Yehuda Savannah, MD    No lipid profile obtained within the past year. A repeat assessment of serum lipids will be undertaken.      Cigarette smoker   Last Assessment & Plan 11/17/2015 Office Visit Written 11/17/2015  3:48 PM by Tanda Rockers, MD    > 3 min discussion I reviewed the Fletcher curve with the patient that basically indicates  if you quit smoking when your best day FEV1 is still well preserved (as is clearly  the case here)  it is highly unlikely you will progress to severe disease and informed the patient there was no medication on the market that has proven to alter the curve/ its downward trajectory  or the likelihood of progression of their disease.  Therefore stopping smoking and maintaining abstinence is the most important aspect of care, not choice of inhalers or for that matter, doctors.        Anxiety and depression   Last Assessment & Plan 12/19/2012 Office Visit Written 12/19/2012  2:40 PM by Yehuda Savannah, MD    Patient is not reporting much in the way of psychological distress of late, perhaps as a result of his multiple physical ailments.  He continues to receive care from behavioral health.      Chronic low back pain   Last Assessment & Plan 12/04/2012 Office Visit Written 12/04/2012 10:32 AM by [name], MD    PT HAS ACUTE FLARE IN L LOWER LUMBAR REGION(PARA-SPINOUS). DIFFERENTIAL DIAGNOSIS INCLUDES EARLY ONSET SHINGLES OR MS STRAIN.  ICE TO PAINFUL AREA TID FOR 10 MINS FOR 7 DAYS IBUPROFEN TID FOR 7 DAYS OPV W/ FREE CLINIC IF SX NOT IMPROVED.      Unspecified vitamin D deficiency   Abdominal pain   Last Assessment & Plan 12/09/2014 Office Visit Written 12/18/2014  9:43 PM by Orvil Feil, NP    50 year old male with chronic RUQ pain, gallbladder remains in situ, last EGD in 2012 with gastritis. No dysphagia. May need repeat EGD with further evaluation for  biliary etiology if negative. Unable to proceed at this time but will return in 6 weeks (at time of wife's appt as well) to discuss further. Will trial Dexilant samples in interim, as Nexium is no longer covered via patient assistance.       Colon cancer screening   Last Assessment & Plan 10/14/2015 Office Visit Written 10/14/2015  9:17 AM by [name], MD    AVERAGE RISK  TCS IN 2017      Heart palpitations   Dyspnea   Last Assessment & Plan 11/17/2015 Office Visit Written 11/17/2015  3:46 PM by Tanda Rockers, MD    Spirometry 05/05/15   FEV1 3.47 (85%)  Ratio  74  - 11/17/2015  After extensive coaching HFA effectiveness =    75% > try symb 80 2bid and stop advair  - Try off advair 11/17/2015 >>>   DDX of  difficult airways management almost all start with A and  include Adherence, Ace Inhibitors, Acid Reflux, Active Sinus Disease, Alpha 1 Antitripsin deficiency, Anxiety masquerading as Airways dz,  ABPA,  Allergy(esp in young), Aspiration (esp in elderly), Adverse effects of meds,  Active smokers, A bunch of PE's (a small clot burden can't cause this syndrome unless there is already severe underlying pulm or vascular dz with poor reserve) plus two Bs  = Bronchiectasis and Beta blocker use..and one C= CHF  Adherence is always the initial "prime suspect" and is a multilayered concern that requires a "trust but verify" approach in every patient - starting with knowing how to use medications, especially inhalers, correctly, keeping up with refills and understanding the fundamental difference between maintenance and prns vs those medications only taken for a very short course and then stopped and not refilled.  - The proper method of use, as well as anticipated side effects, of a metered-dose inhaler are discussed and demonstrated to the patient. Improved effectiveness after extensive coaching during this visit to a level of approximately 75 % from a baseline of 50 %   Active smoking (see separate  a/p)    ? Adverse effect of advair > d/c  ? acei case (see separate a/p)   ? Acid (or non-acid) GERD > always difficult to exclude as up to 75% of pts in some series report no assoc GI/ Heartburn symptoms> rec continue max (24h)  acid suppression and diet restrictions/ reviewed     ? Anxiety > dx of exclusion   ? Allergy/asthma > symb 80 2bid should cover this  ? BB effect > doubt s more evidence of airflow obst on exam on spirometery esp with relatively low dose toprol  Total time devoted to counseling  = 35/68m review case with pt/ discussion of options/alternatives/ personally creating in presence of pt  then going over specific  Instructions directly with the pt including how to use all of the meds but in particular covering each new medication in detail (see avs)              Imaging: Nm Hepato W/eject Fract  01/20/2016  CLINICAL DATA:  Right upper quadrant abdominal pain over the last 4 months, slight nausea EXAM: NUCLEAR MEDICINE HEPATOBILIARY IMAGING WITH GALLBLADDER EF TECHNIQUE: Sequential images of the abdomen were obtained out to 60 minutes following intravenous administration of radiopharmaceutical. After slow intravenous infusion of 1.85 micrograms Cholecystokinin, gallbladder ejection fraction was determined. RADIOPHARMACEUTICALS:  5.0 mCi Tc-68m Choletec IV COMPARISON:  Ultrasound of the abdomen of 01/14/2016 FINDINGS: Prompt uptake and biliary excretion of activity by the liver is seen. Gallbladder activity is visualized, consistent with patency of cystic duct. Biliary activity passes into small bowel, consistent with patent common bile duct. Calculated gallbladder ejection fraction is 83%. (At 60 min, normal ejection fraction is greater than 40%.) IMPRESSION: 1. Normal nuclear medicine hepatobiliary scan. 2. Normal gallbladder ejection fraction of 83%. Electronically Signed   By: Ivar Drape M.D.   On: 01/20/2016 10:24   US Abdomen Limited Ruq  01/14/2016  CLINICAL DATA:   Right upper quadrant pain. EXAM: US ABDOMEN LIMITED - RIGHT UPPER QUADRANT COMPARISON:  11/20/2012. FINDINGS: Gallbladder: No gallstones or wall thickening visualized. No sonographic Murphy sign noted by sonographer. Common bile duct: Diameter: 2.7  mm Liver: The liver is echogenic consistent with fatty infiltration and/or hepatocellular disease. No focal hepatic abnormality identified. IMPRESSION: The liver is echogenic consistent fatty infiltration and/or hepatocellular disease. No focal hepatic abnormality identified. No gallstones or biliary distention. Electronically Signed   By: Marcello Moores  Register   On: 01/14/2016 08:43

## 2016-01-29 ENCOUNTER — Telehealth: Payer: Self-pay | Admitting: Physician Assistant

## 2016-01-29 NOTE — Telephone Encounter (Signed)
Paged by answering service, the patient is having palpitations. He was outside all day. BP of 141/94 at rate of 65 bpm. Advice to stay inside, drink enough water. He can take extra 2.5mg  of amlodipine if BP above 150/100. No chest pain or SOB. However feels weak and tired. If no improvement, go to ER.

## 2016-01-31 ENCOUNTER — Telehealth: Payer: Self-pay | Admitting: Adult Health

## 2016-01-31 MED ORDER — AMLODIPINE BESYLATE 5 MG PO TABS: 5.0000 mg | ORAL_TABLET | Freq: Every day | ORAL | Status: DC

## 2016-01-31 NOTE — Telephone Encounter (Signed)
As soon as Norvasc was decreased to 2.5 mg ,BP has run 140's/90's,please advise     Forward to Arnold Long NP

## 2016-01-31 NOTE — Telephone Encounter (Signed)
Wife notified.

## 2016-01-31 NOTE — Telephone Encounter (Signed)
Patient's wife states that patient has been having issues w/ elevated BP's. / tg

## 2016-01-31 NOTE — Telephone Encounter (Signed)
May increase norvasc to 5 mg daily.

## 2016-02-02 ENCOUNTER — Ambulatory Visit (INDEPENDENT_AMBULATORY_CARE_PROVIDER_SITE_OTHER): Payer: Self-pay

## 2016-02-02 VITALS — BP 138/70 | HR 65 | Wt 206.0 lb

## 2016-02-02 DIAGNOSIS — I10 Essential (primary) hypertension: Secondary | ICD-10-CM

## 2016-02-02 NOTE — Progress Notes (Signed)
Pt called to complain that his bp has been up with his home bp machine. i offered him a nurse visit. His bp was 130/78 manually and on his machine it was 143/91. I advised him to change his batteries in his bp machine and remember it is a little off when taking his bp at home. Pt and wife voiced understanding

## 2016-02-03 ENCOUNTER — Telehealth: Payer: Self-pay

## 2016-02-03 DIAGNOSIS — I4891 Unspecified atrial fibrillation: Secondary | ICD-10-CM

## 2016-02-03 NOTE — Telephone Encounter (Signed)
Pt notified referral placed for Dr Rayann Heman

## 2016-02-03 NOTE — Telephone Encounter (Signed)
Pt has no insurance,referred to Denyse Amass at health dept for help,pt aware

## 2016-02-03 NOTE — Telephone Encounter (Signed)
-----   Message from Herminio Commons, MD sent at 02/03/2016  9:25 AM EDT ----- Regarding: RE: wants to see you due to afib driving him crazy  Maybe have him see EP for a fib ablation consultation with [facility].  ----- Message -----    From: Drema Dallas, CMA    Sent: 02/02/2016   4:24 PM      To: Herminio Commons, MD Subject: wants to see you due to afib driving him cra#  Pt came in office today for a bp check, but really wanted to see you asap! He was complaining that he is not able to even lay down at night without feeling like he cant breathe. He states that is was just coming and going but now it is constant. He really trusts you and your opinion and stated he doesn't want to see anyone else concerning this. He will drive to [facility] if needed. Please advise.=)

## 2016-02-10 ENCOUNTER — Telehealth: Payer: Self-pay | Admitting: Cardiovascular Disease

## 2016-02-10 NOTE — Telephone Encounter (Signed)
States that patient is having pain in joints. Wandering if this could be coming from medications. / tg

## 2016-02-10 NOTE — Telephone Encounter (Signed)
Very weak statin, thus doubtful.

## 2016-02-10 NOTE — Telephone Encounter (Signed)
Pt taking mevacor currently

## 2016-02-11 NOTE — Telephone Encounter (Signed)
Pt has 2 fingers that are painful,will see pcp

## 2016-02-11 NOTE — Telephone Encounter (Signed)
Pt states

## 2016-02-15 ENCOUNTER — Encounter: Payer: Self-pay | Admitting: Gastroenterology

## 2016-02-16 ENCOUNTER — Ambulatory Visit: Payer: Self-pay | Admitting: Gastroenterology

## 2016-02-18 ENCOUNTER — Ambulatory Visit: Payer: Self-pay | Admitting: Cardiovascular Disease

## 2016-02-23 ENCOUNTER — Ambulatory Visit (INDEPENDENT_AMBULATORY_CARE_PROVIDER_SITE_OTHER): Payer: Self-pay | Admitting: Psychiatry

## 2016-02-23 ENCOUNTER — Encounter (HOSPITAL_COMMUNITY): Payer: Self-pay | Admitting: Psychiatry

## 2016-02-23 VITALS — BP 126/78 | HR 63 | Ht 71.0 in | Wt 209.6 lb

## 2016-02-23 DIAGNOSIS — F419 Anxiety disorder, unspecified: Secondary | ICD-10-CM

## 2016-02-23 DIAGNOSIS — F4001 Agoraphobia with panic disorder: Secondary | ICD-10-CM

## 2016-02-23 DIAGNOSIS — M545 Low back pain: Secondary | ICD-10-CM

## 2016-02-23 DIAGNOSIS — F418 Other specified anxiety disorders: Secondary | ICD-10-CM

## 2016-02-23 DIAGNOSIS — F329 Major depressive disorder, single episode, unspecified: Secondary | ICD-10-CM

## 2016-02-23 DIAGNOSIS — G8929 Other chronic pain: Secondary | ICD-10-CM

## 2016-02-23 DIAGNOSIS — F17200 Nicotine dependence, unspecified, uncomplicated: Secondary | ICD-10-CM

## 2016-02-23 MED ORDER — ALPRAZOLAM 0.5 MG PO TABS: ORAL_TABLET | ORAL | 2 refills | Status: DC

## 2016-02-23 MED ORDER — PAROXETINE HCL 20 MG PO TABS: 20.0000 mg | ORAL_TABLET | Freq: Every day | ORAL | 2 refills | Status: DC

## 2016-02-23 NOTE — Progress Notes (Signed)
Patient ID: Richard [name], male   DOB: February 25, 1966, 50 y.o.   MRN: [id number] Patient ID: Richard [name], male   DOB: 1965/09/03, 50 y.o.   MRN: [id number] Patient ID: Richard [name], male   DOB: 1965-12-02, 50 y.o.   MRN: [id number] Patient ID: Richard [name], male   DOB: February 12, 1966, 50 y.o.   MRN: [id number] Patient ID: Richard [name], male   DOB: [birth year], 50 y.o.   MRN: [id number] Patient ID: Richard [name], male   DOB: [birth year], 50 y.o.   MRN: [id number] Patient ID: Richard [name], male   DOB: 1965-07-20, 50 y.o.   MRN: [id number] Patient ID: Richard [name], male   DOB: Dec 31, 1965, 50 y.o.   MRN: [id number] Patient ID: Richard [name], male   DOB: 1966-06-20, 50 y.o.   MRN: [id number] Patient ID: Richard [name], male   DOB: 1965-09-11, 50 y.o.   MRN: [id number] Patient ID: Richard [name], male   DOB: [birth year], 50 y.o.   MRN: [id number] Patient ID: Richard [name], male   DOB: 1966/05/20, 50 y.o.   MRN: [id number] Patient ID: Richard [name], male   DOB: Apr 21, 1966, 50 y.o.   MRN: [id number] Patient ID: DELVONTE [name], male   DOB: 1966/06/08, 50 y.o.   MRN: [id number] Patient ID: Richard [name], male   DOB: 1965-08-25, 50 y.o.   MRN: [id number] Patient ID: Richard [name], male   DOB: 08-11-1965, 50 y.o.   MRN: [id number] [name] [id number] Progress Note Richard [name] MRN: [id number] DOB: 10-18-1965 Age: 50 y.o.  Date: 02/23/2016  No chief complaint on file.  History of Chief Complaint:   This 50 year old Caucasian male who came for his followup appointment. He is currently living with his wife in [place]. He is unemployed and applying for disability.  The patient states that he has been employed for 51 years. 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 He got injured on the job and both shoulders were torn. He has not been able to work ever since and he feels very badly about this. 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  Since  he's not been able to work the patient has been having increasingly depressed and anxious. He feels like his body gives out when he is trying to exert himself even in a minor way. He's been in the ER recently for chest pain which was ruled out as a panic attack. He also saw cardiologist this week and will be having a stress test. His blood pressure was too low and his lisinopril was cut down. He is also trying to quit smoking by using electronic cigarettes.  The patient returns after 6 weeks. He states that he may be having atrial fibrillation he's going to be seeing another cardiac specialist in [place] soon. This is been really upsetting him and making him more anxious. He is really worried about money still since he was turned down for disability. He states he may have to try to work some jobs and get fired just approves of and that he is not able to work. The Paxil seems to be helping him as his mood is fairly stable and the Xanax helps his anxiety. He is sleeping better Depression         Associated symptoms include fatigue.  Past medical history includes anxiety.   Anxiety  Presents for follow-up  visit. Symptoms include nervous/anxious behavior.     Review of Systems  Constitutional: Positive for fatigue.  Gastrointestinal: Negative.   Musculoskeletal: Positive for back pain.  Skin: Negative.   Psychiatric/Behavioral: Positive for depression, dysphoric mood and sleep disturbance. The patient is nervous/anxious.     Physical Exam Vitals: BP 126/78 (BP Location: Right Arm, Patient Position: Sitting, Cuff Size: Large)   Pulse 63   Ht 5\' 11"  (1.803 m)   Wt 209 lb 9.6 oz (95.1 kg)   SpO2 95%   BMI 29.23 kg/m    Past Psychiatric History: Diagnosis: none  Hospitalizations: none  Outpatient Care: none  Substance Abuse Care: none  Self-Mutilation: none  Suicidal Attempts: none  Violent Behaviors: none   Allergies: Allergies  Allergen Reactions  . Dexilant [Dexlansoprazole]  Anaphylaxis  . Mushroom Ext Cmplx(Shiitake-Reishi-Mait) Anaphylaxis    Rapid heart rate.  . Penicillins Anaphylaxis    Has patient had a PCN reaction causing immediate rash, facial/tongue/throat swelling, SOB or lightheadedness with hypotension: Yes Has patient had a PCN reaction causing severe rash involving mucus membranes or skin necrosis: No Has patient had a PCN reaction that required hospitalization Yes Has patient had a PCN reaction occurring within the last 10 years: No If all of the above answers are "NO", then may proceed with Cephalosporin use.    Medical History: Past Medical History:  Diagnosis Date  . Arthritis    "legs, spine, shoulders" (08/12/2015)  . Asthma   . Chest pain    + palpitations; cath 2005- 30-40% mid LAD, 20% D1, 20% cx, OM, 20-30% RCA, and EF-55%  . Chronic low back pain   . Colitis 1990  . COPD (chronic obstructive pulmonary disease) ([place])   . Depression   . Gastric ulcer 2003; 2012   2003: + esophagitis; negative H.pylori serology  2012: Dr. Oneida Alar, mild gastritis, Bravo PH probe placement, negative H.pylori  . GERD (gastroesophageal reflux disease)   . Hepatic steatosis   . History of hiatal hernia   . Hyperlipemia   . Hypertension   . Panic attacks   . Pneumonia 2000s X 2  . Tobacco abuse    1/2 pack per day  . Type II diabetes mellitus ([place])    Surgical History: Past Surgical History:  Procedure Laterality Date  . BRAVO [place] STUDY  05/03/2011   [id number] gastritis/normal esophagus and duodenum  . CARDIAC CATHETERIZATION  1990s X 1; 2005; 08/12/2015  . CARDIAC CATHETERIZATION N/A 08/12/2015   Procedure: Left Heart Cath and Coronary Angiography;  Surgeon: Belva Crome, MD; LAD 20%, CFX 65%, RCA 20%, EF 60%   . COLONOSCOPY  1990  . ESOPHAGOGASTRODUODENOSCOPY  05/03/2011   [id number] gastritis  . NECK MASS EXCISION Right    "done in dr's office; behind right ear/side of ncek"  . SHOULDER ARTHROSCOPY W/ ROTATOR CUFF REPAIR Right 2006    acromioclavicular joint arthrosis   Family History: family history includes ADD / ADHD in his daughter; Alcohol abuse in his father and mother; Anxiety disorder in his sister and sister; Dementia in his cousin and paternal uncle; Depression in his sister; Diabetes in his brother and father; Heart attack (age of onset: 37) in his brother; Heart attack (age of onset: 78) in his father; Hypertension in his brother, brother, and brother; Lung cancer in his mother; Seizures in his brother. Reviewed again today and nothing new.   Current Medications:  Current Outpatient Prescriptions  Medication Sig Dispense Refill  . acetaminophen (TYLENOL) 325 MG tablet Take 650  mg by mouth every 6 (six) hours as needed for pain or fever.     [name] albuterol (VENTOLIN HFA) 108 (90 BASE) MCG/ACT inhaler Inhale 2 puffs into the lungs every 6 (six) hours as needed for wheezing or shortness of breath.     . ALPRAZolam (XANAX) 0.5 MG tablet Take one three times a day and two at night 150 tablet 2  . amLODipine (NORVASC) 5 MG tablet Take 1 tablet (5 mg total) by mouth daily. 30 tablet 3  . aspirin 81 MG tablet Take 81 mg by mouth at bedtime.     . budesonide-formoterol (SYMBICORT) 80-4.5 MCG/ACT inhaler Take 2 puffs first thing in am and then another 2 puffs about 12 hours later.    . calcium carbonate (TUMS - DOSED IN MG ELEMENTAL CALCIUM) 500 MG chewable tablet Chew 1 tablet by mouth daily as needed for indigestion or heartburn.    . cyclobenzaprine (FLEXERIL) 10 MG tablet Take 1 tablet (10 mg total) by mouth 3 (three) times daily. 21 tablet 0  . esomeprazole (NEXIUM) 40 MG capsule Take 1 capsule (40 mg total) by mouth 2 (two) times daily before a meal. 180 capsule 3  . HYDROcodone-acetaminophen (NORCO/VICODIN) 5-325 MG tablet Take 1 tablet by mouth every 4 (four) hours as needed. 15 tablet 0  . linaclotide (LINZESS) 145 MCG CAPS capsule Take 1 capsule (145 mcg total) by mouth daily before breakfast. 30 capsule 0  .  lovastatin (MEVACOR) 20 MG tablet TAKE ONE TABLET BY MOUTH ONCE DAILY AT 6:OO PM 30 tablet 11  . metFORMIN (GLUCOPHAGE) 500 MG tablet Take 1 tablet (500 mg total) by mouth 2 (two) times daily with a meal. 30 tablet 2  . metoprolol succinate (TOPROL-XL) 50 MG 24 hr tablet Take 1 tablet (50 mg total) by mouth daily. Take with or immediately following a meal. 30 tablet 3  . nitroGLYCERIN (NITROSTAT) 0.4 MG SL tablet Place 1 tablet (0.4 mg total) under the tongue every 5 (five) minutes as needed for chest pain. 25 tablet 3  . PARoxetine (PAXIL) 20 MG tablet Take 1 tablet (20 mg total) by mouth at bedtime. 30 tablet 2  . rivaroxaban (XARELTO) 20 MG TABS tablet Take 1 tablet (20 mg total) by mouth daily with supper. 30 tablet 3   No current facility-administered medications for this visit.     Previous Psychotropic Medications: Medication Dose   Xanax     Substance Abuse History in the last 12 months: Substance Age of 1st Use Last Use Amount Specific Type  Nicotine  17  2 hours ago  1  cigarette  Alcohol  22  38      Cannabis  none        Opiates  36  today  2.5 mg  hyrdocodone  Cocaine  none        Methamphetamines  none        LSD  none        Ecstasy  none         Benzodiazepines  22  started in early thrities  0.5 mg  Xanax  Caffeine  childhood  this AM  1 cup  [place]  Inhalants  none        Others:      Sugar  11  this AM  20 tsps  in the [place] Consequences of Substance Abuse: pain Legal Consequences of Substance Abuse: none Family Consequences of Substance Abuse: none Blackouts:  No DT's:  No Withdrawal Symptoms:  Yes Tremors  Social History: Patient lives with his life, daughter and her husband with 2 grandbabies.  He has 2 sons and the daughter.  He is a Programmer, systems.    Mental Status Examination/Evaluation: Objective:  Appearance: Casual  Eye Contact::  Good  Speech:  Clear and Coherent  Volume:  Normal  Mood:  anxious   Affect:  Congruent  Thought  Process:  Coherent, Intact and Linear  Orientation:  Full (Time, Place, and Person)  Thought Content:  WDL  Suicidal Thoughts:  No  Homicidal Thoughts:  No  Judgement:  Good  Insight:  Fair  Psychomotor Activity:  Normal  Akathisia:  No  Handed:  Right  AIMS (if indicated):    Assets:  Communication Skills Desire for Improvement   Lab Results:  Results for orders placed or performed during the hospital encounter of 01/20/16 (from the past 8736 hour(s))  CBC with Differential/Platelet   Collection Time: 01/20/16 10:26 AM  Result Value Ref Range   WBC 7.6 4.0 - 10.5 K/uL   RBC 5.05 4.22 - 5.81 MIL/uL   Hemoglobin 16.0 13.0 - 17.0 g/dL   HCT 45.5 39.0 - 52.0 %   MCV 90.1 78.0 - 100.0 fL   MCH 31.7 26.0 - 34.0 pg   MCHC 35.2 30.0 - 36.0 g/dL   RDW 13.1 11.5 - 15.5 %   Platelets 189 150 - 400 K/uL   Neutrophils Relative % 63 %   Neutro Abs 4.8 1.7 - 7.7 K/uL   Lymphocytes Relative 27 %   Lymphs Abs 2.1 0.7 - 4.0 K/uL   Monocytes Relative 8 %   Monocytes Absolute 0.6 0.1 - 1.0 K/uL   Eosinophils Relative 2 %   Eosinophils Absolute 0.2 0.0 - 0.7 K/uL   Basophils Relative 0 %   Basophils Absolute 0.0 0.0 - 0.1 K/uL  Results for orders placed or performed during the hospital encounter of 01/14/16 (from the past 8736 hour(s))  Basic metabolic panel   Collection Time: 01/14/16  2:31 PM  Result Value Ref Range   Sodium 136 135 - 145 mmol/L   Potassium 3.8 3.5 - 5.1 mmol/L   Chloride 105 101 - 111 mmol/L   CO2 25 22 - 32 mmol/L   Glucose, Bld 128 (H) 65 - 99 mg/dL   BUN 16 6 - 20 mg/dL   Creatinine, Ser 0.94 0.61 - 1.24 mg/dL   Calcium 8.7 (L) 8.9 - 10.3 mg/dL   GFR calc non Af Amer >60 >60 mL/min   GFR calc Af Amer >60 >60 mL/min   Anion gap 6 5 - 15  Magnesium   Collection Time: 01/14/16  2:31 PM  Result Value Ref Range   Magnesium 1.9 1.7 - 2.4 mg/dL  Results for orders placed or performed during the hospital encounter of 08/12/15 (from the past 8736 hour(s))  Glucose,  capillary   Collection Time: 08/12/15  6:14 AM  Result Value Ref Range   Glucose-Capillary 124 (H) 65 - 99 mg/dL  Protime-INR   Collection Time: 08/12/15  6:46 AM  Result Value Ref Range   Prothrombin Time 13.1 11.6 - 15.2 seconds   INR 0.97 0.00 - [id number]  Basic metabolic panel   Collection Time: 08/12/15  6:46 AM  Result Value Ref Range   Sodium 138 135 - 145 mmol/L   Potassium 4.2 3.5 - 5.1 mmol/L   Chloride 101 101 - 111 mmol/L   CO2 25 22 - 32 mmol/L  Glucose, Bld 137 (H) 65 - 99 mg/dL   BUN 12 6 - 20 mg/dL   Creatinine, Ser 0.89 0.61 - 1.24 mg/dL   Calcium 9.2 8.9 - 10.3 mg/dL   GFR calc non Af Amer >60 >60 mL/min   GFR calc Af Amer >60 >60 mL/min   Anion gap 12 5 - 15  CBC   Collection Time: 08/12/15  6:46 AM  Result Value Ref Range   WBC 6.6 4.0 - 10.5 K/uL   RBC 4.99 4.22 - 5.81 MIL/uL   Hemoglobin 15.7 13.0 - 17.0 g/dL   HCT 45.0 39.0 - 52.0 %   MCV 90.2 78.0 - 100.0 fL   MCH 31.5 26.0 - 34.0 pg   MCHC 34.9 30.0 - 36.0 g/dL   RDW 12.3 11.5 - 15.5 %   Platelets 232 150 - 400 K/uL  Glucose, capillary   Collection Time: 08/12/15  8:43 AM  Result Value Ref Range   Glucose-Capillary 128 (H) 65 - 99 mg/dL  Glucose, capillary   Collection Time: 08/12/15 12:17 PM  Result Value Ref Range   Glucose-Capillary 155 (H) 65 - 99 mg/dL  APTT   Collection Time: 08/12/15 12:36 PM  Result Value Ref Range   aPTT 30 24 - 37 seconds  Glucose, capillary   Collection Time: 08/12/15  5:58 PM  Result Value Ref Range   Glucose-Capillary 117 (H) 65 - 99 mg/dL  Glucose, capillary   Collection Time: 08/12/15 10:11 PM  Result Value Ref Range   Glucose-Capillary 129 (H) 65 - 99 mg/dL  Hemoglobin A1c   Collection Time: 08/13/15  5:10 AM  Result Value Ref Range   Hgb A1c MFr Bld 7.8 (H) 4.8 - 5.6 %   Mean Plasma Glucose 177 mg/dL  Lipid panel   Collection Time: 08/13/15  5:10 AM  Result Value Ref Range   Cholesterol 119 0 - 200 mg/dL   Triglycerides 142 <150 mg/dL   HDL 27 (L)  >40 mg/dL   Total CHOL/HDL Ratio 4.4 RATIO   VLDL 28 0 - 40 mg/dL   LDL Cholesterol 64 0 - 99 mg/dL  Glucose, capillary   Collection Time: 08/13/15  7:21 AM  Result Value Ref Range   Glucose-Capillary 104 (H) 65 - 99 mg/dL   Comment 1 Notify RN    Comment 2 Document in Chart   Glucose, capillary   Collection Time: 08/13/15 11:17 AM  Result Value Ref Range   Glucose-Capillary 171 (H) 65 - 99 mg/dL  Glucose, capillary   Collection Time: 08/13/15  4:25 PM  Result Value Ref Range   Glucose-Capillary 124 (H) 65 - 99 mg/dL  Results for orders placed or performed in visit on 08/11/15 (from the past 8736 hour(s))  Hepatic function panel   Collection Time: 08/31/15  3:54 PM  Result Value Ref Range   Total Bilirubin 0.3 0.2 - 1.2 mg/dL   Bilirubin, Direct 0.1 <=0.2 mg/dL   Indirect Bilirubin 0.2 0.2 - 1.2 mg/dL   Alkaline Phosphatase 53 40 - 115 U/L   AST 17 10 - 35 U/L   ALT 25 9 - 46 U/L   Total Protein 6.8 6.1 - 8.1 g/dL   Albumin 4.3 3.6 - 5.1 g/dL  Results for orders placed or performed in visit on 08/03/15 (from the past 8736 hour(s))  Basic metabolic panel   Collection Time: 08/03/15 12:30 PM  Result Value Ref Range   Sodium 132 (L) 135 - 146 mmol/L   Potassium 4.6 3.5 -  5.3 mmol/L   Chloride 99 98 - 110 mmol/L   CO2 26 20 - 31 mmol/L   Glucose, Bld 179 (H) 65 - 99 mg/dL   BUN 15 7 - 25 mg/dL   Creat 0.85 0.70 - 1.33 mg/dL   Calcium 9.5 8.6 - 10.3 mg/dL  Results for orders placed or performed during the hospital encounter of 05/05/15 (from the past 8736 hour(s))  Spirometry with Graph   Collection Time: 05/05/15  9:40 AM  Result Value Ref Range   FVC-Pre 4.67 L   FVC-%Pred-Pre 89 %   FEV1-Pre 3.47 L   FEV1-%Pred-Pre 85 %   FEV6-Pre 4.65 L   FEV6-%Pred-Pre 92 %   Pre FEV1/FVC ratio 74 %   FEV1FVC-%Pred-Pre 95 %   Pre FEV6/FVC Ratio 100 %   FEV6FVC-%Pred-Pre 102 %   FEF 25-75 Pre 2.60 L/sec   FEF2575-%Pred-Pre 73 %  Results for orders placed or performed in visit  on 05/05/15 (from the past 8736 hour(s))  Lipid panel   Collection Time: 05/05/15  8:47 AM  Result Value Ref Range   Cholesterol 146 125 - 200 mg/dL   Triglycerides 204 (H) <150 mg/dL   HDL 28 (L) >=40 mg/dL   Total CHOL/HDL Ratio 5.2 (H) <=5.0 Ratio   VLDL 41 (H) <30 mg/dL   LDL Cholesterol 77 <130 mg/dL    Assessment:   AXIS I Generalized Anxiety Disorder, Major Depression, Recurrent severe and Panic Disorder  AXIS II Deferred  AXIS III Past Medical History:  Diagnosis Date  . Arthritis    "legs, spine, shoulders" (08/12/2015)  . Asthma   . Chest pain    + palpitations; cath 2005- 30-40% mid LAD, 20% D1, 20% cx, OM, 20-30% RCA, and EF-55%  . Chronic low back pain   . Colitis 1990  . COPD (chronic obstructive pulmonary disease) ([place])   . Depression   . Gastric ulcer 2003; 2012   2003: + esophagitis; negative H.pylori serology  2012: Dr. Oneida Alar, mild gastritis, Bravo PH probe placement, negative H.pylori  . GERD (gastroesophageal reflux disease)   . Hepatic steatosis   . History of hiatal hernia   . Hyperlipemia   . Hypertension   . Panic attacks   . Pneumonia 2000s X 2  . Tobacco abuse    1/2 pack per day  . Type II diabetes mellitus ([place])      AXIS IV other psychosocial or environmental problems  AXIS V 41-50 serious symptoms   Treatment Plan/Recommendations: Laboratory:    Psychotherapy: supportive psychotherapy, he will reschedule with Jenny Reichmann Rodenbaugh   Medications:  He will continue  Xanax 0.5 mg 3 times a day and 1 mg at bedtime. He will Continue Paxil 20 mg at bedtime   Routine PRN Medications:  No  Consultations:    Safety Concerns:  none  Other:  Return in 3 months      MEDICATIONS this encounter: Meds ordered this encounter  Medications  . PARoxetine (PAXIL) 20 MG tablet    Sig: Take 1 tablet (20 mg total) by mouth at bedtime.    Dispense:  30 tablet    Refill:  2  . ALPRAZolam (XANAX) 0.5 MG tablet    Sig: Take one three times a day and two at  night    Dispense:  150 tablet    Refill:  2   Medical Decision Making Problem Points:  Established problem, stable/improving (1), Review of last therapy session (1) and Review of psycho-social stressors (1) Data Points:  Review or order clinical lab tests (1) Review of medication regiment & side effects (2) Review of new medications or change in dosage (2)  I certify that outpatient services furnished can reasonably be expected to improve the patient's condition.   [name], MD Patient ID: ABIEL ODEH, male   DOB: [birth year], 50 y.o.   MRN: [id number]

## 2016-03-01 ENCOUNTER — Encounter: Payer: Self-pay | Admitting: Internal Medicine

## 2016-03-01 ENCOUNTER — Ambulatory Visit (INDEPENDENT_AMBULATORY_CARE_PROVIDER_SITE_OTHER): Payer: Self-pay | Admitting: Internal Medicine

## 2016-03-01 ENCOUNTER — Telehealth: Payer: Self-pay | Admitting: Internal Medicine

## 2016-03-01 ENCOUNTER — Encounter (INDEPENDENT_AMBULATORY_CARE_PROVIDER_SITE_OTHER): Payer: Self-pay

## 2016-03-01 VITALS — BP 120/84 | HR 73 | Ht 71.0 in | Wt 210.8 lb

## 2016-03-01 DIAGNOSIS — I480 Paroxysmal atrial fibrillation: Secondary | ICD-10-CM

## 2016-03-01 DIAGNOSIS — F17200 Nicotine dependence, unspecified, uncomplicated: Secondary | ICD-10-CM

## 2016-03-01 DIAGNOSIS — I10 Essential (primary) hypertension: Secondary | ICD-10-CM

## 2016-03-01 MED ORDER — FLECAINIDE ACETATE 50 MG PO TABS
50.0000 mg | ORAL_TABLET | Freq: Two times a day (BID) | ORAL | 3 refills | Status: DC
Start: 2016-03-01 — End: 2016-03-13

## 2016-03-01 NOTE — Progress Notes (Signed)
Electrophysiology Office Note   Date:  03/01/2016   ID:  Richard [name], DOB [birth year], MRN [id number]  PCP:  Raiford Simmonds., PA-C  Cardiologist:  Dr Bronson Ing Primary Electrophysiologist: Thompson Grayer, MD    Chief Complaint  Patient presents with  . Atrial Fibrillation     History of Present Illness: Richard [name] is a 50 y.o. male who presents today for electrophysiology evaluation.   The patient reports having palpitations for at least a year.  Episodes have increased in frequency and duration.  He finds that episodes are worse when laying on his L side at night.  He has recently begun noticing similar palpitations during the day.  He describes these as intense and more frequent.  He has associated chest soreness and breathlessness.  He has chronic SOB due to tobacco and underlying lung disease.  He has had dizziness with one episode but denies presyncope or syncope.  In June, he wore a monitor which documented afib at the cause.  He was placed on xarelto.  He has been referred for further evaluation.  Today, he denies symptoms of orthopnea, PND, lower extremity edema, claudication, bleeding, or neurologic sequela. The patient is tolerating medications without difficulties and is otherwise without complaint today.    Past Medical History:  Diagnosis Date  . Arthritis    "legs, spine, shoulders" (08/12/2015)  . Asthma   . Chest pain    + palpitations; cath 2005- 30-40% mid LAD, 20% D1, 20% cx, OM, 20-30% RCA, and EF-55%  . Chronic low back pain   . Colitis 1990  . COPD (chronic obstructive pulmonary disease) ([place])   . Depression   . Gastric ulcer 2003; 2012   2003: + esophagitis; negative H.pylori serology  2012: Dr. Oneida Alar, mild gastritis, Bravo PH probe placement, negative H.pylori  . GERD (gastroesophageal reflux disease)   . Hepatic steatosis   . History of hiatal hernia   . Hyperlipemia   . Hypertension   . Overweight   . Panic attacks   . Paroxysmal atrial  fibrillation (HCC)   . Pneumonia 2000s X 2  . Snores   . TIA (transient ischemic attack)    after cath 1/ 17  . Tobacco abuse    1/2 pack per day  . Type II diabetes mellitus ([place])    Past Surgical History:  Procedure Laterality Date  . BRAVO [place] STUDY  05/03/2011   [id number] gastritis/normal esophagus and duodenum  . CARDIAC CATHETERIZATION  1990s X 1; 2005; 08/12/2015  . CARDIAC CATHETERIZATION N/A 08/12/2015   Procedure: Left Heart Cath and Coronary Angiography;  Surgeon: Belva Crome, MD; LAD 20%, CFX 65%, RCA 20%, EF 60%   . COLONOSCOPY  1990  . ESOPHAGOGASTRODUODENOSCOPY  05/03/2011   [id number] gastritis  . NECK MASS EXCISION Right    "done in dr's office; behind right ear/side of ncek"  . SHOULDER ARTHROSCOPY W/ ROTATOR CUFF REPAIR Right 2006   acromioclavicular joint arthrosis     Current Outpatient Prescriptions  Medication Sig Dispense Refill  . acetaminophen (TYLENOL) 325 MG tablet Take 650 mg by mouth every 6 (six) hours as needed for pain or fever.     [name] albuterol (VENTOLIN HFA) 108 (90 BASE) MCG/ACT inhaler Inhale 2 puffs into the lungs every 6 (six) hours as needed for wheezing or shortness of breath.     . ALPRAZolam (XANAX) 0.5 MG tablet Take one tablet by mouth three times a day and two tablets at night    .  amLODipine (NORVASC) 5 MG tablet Take 1 tablet (5 mg total) by mouth daily. 30 tablet 3  . budesonide-formoterol (SYMBICORT) 80-4.5 MCG/ACT inhaler Inhale 2 puffs into the lungs first thing in am and then another 2 puffs about 12 hours later.    . calcium carbonate (TUMS - DOSED IN MG ELEMENTAL CALCIUM) 500 MG chewable tablet Chew 1 tablet by mouth daily as needed for indigestion or heartburn.    . cyclobenzaprine (FLEXERIL) 10 MG tablet Take 1 tablet (10 mg total) by mouth 3 (three) times daily. 21 tablet 0  . esomeprazole (NEXIUM) 40 MG capsule Take 1 capsule (40 mg total) by mouth 2 (two) times daily before a meal. 180 capsule 3  . HYDROcodone-acetaminophen  (NORCO/VICODIN) 5-325 MG tablet Take 1 tablet by mouth every 4 (four) hours as needed (pain).    [name] linaclotide (LINZESS) 145 MCG CAPS capsule Take 1 capsule (145 mcg total) by mouth daily before breakfast. 30 capsule 0  . lovastatin (MEVACOR) 20 MG tablet TAKE ONE TABLET BY MOUTH ONCE DAILY AT 6:OO PM 30 tablet 11  . metFORMIN (GLUCOPHAGE) 500 MG tablet Take 1 tablet (500 mg total) by mouth 2 (two) times daily with a meal. 30 tablet 2  . metoprolol succinate (TOPROL-XL) 50 MG 24 hr tablet Take 1 tablet (50 mg total) by mouth daily. Take with or immediately following a meal. 30 tablet 3  . nitroGLYCERIN (NITROSTAT) 0.4 MG SL tablet Place 1 tablet (0.4 mg total) under the tongue every 5 (five) minutes as needed for chest pain. 25 tablet 3  . PARoxetine (PAXIL) 20 MG tablet Take 1 tablet (20 mg total) by mouth at bedtime. 30 tablet 2  . rivaroxaban (XARELTO) 20 MG TABS tablet Take 1 tablet (20 mg total) by mouth daily with supper. 30 tablet 3   No current facility-administered medications for this visit.     Allergies:   Dexilant [dexlansoprazole]; Mushroom ext cmplx(shiitake-reishi-mait); and Penicillins   Social History:  The patient  reports that he has been smoking Cigarettes.  He started smoking about 33 years ago. He has a 12.50 pack-year smoking history. He has never used smokeless tobacco. He reports that he does not drink alcohol or use drugs.   Family History:  The patient's  family history includes ADD / ADHD in his daughter; Alcohol abuse in his father and mother; Anxiety disorder in his sister and sister; Dementia in his cousin and paternal uncle; Depression in his sister; Diabetes in his brother and father; Heart attack (age of onset: 9) in his brother; Heart attack (age of onset: 11) in his father; Hypertension in his brother, brother, and brother; Lung cancer in his mother; Seizures in his brother.    ROS:  Please see the history of present illness.   All other systems are reviewed  and negative.    PHYSICAL EXAM: VS:  BP 120/84   Pulse 73   Ht 5\' 11"  (1.803 m)   Wt 210 lb 12.8 oz (95.6 kg)   BMI 29.40 kg/m  , BMI Body mass index is 29.4 kg/m. GEN: Well nourished, well developed, in no acute distress  HEENT: normal  Neck: no JVD, carotid bruits, or masses Cardiac: RRR; no murmurs, rubs, or gallops,no edema  Respiratory:  clear to auscultation bilaterally, normal work of breathing GI: soft, nontender, nondistended, + BS MS: no deformity or atrophy  Skin: warm and dry  Neuro:  Strength and sensation are intact Psych: euthymic mood, full affect  EKG:  EKG is  ordered today. The ekg ordered today shows sinus rhythm 73 bpm, nonspecific ST/T changes, PVCs   Recent Labs: 08/31/2015: ALT 25 01/14/2016: BUN 16; Creatinine, Ser 0.94; Magnesium 1.9; Potassium 3.8; Sodium 136 01/20/2016: Hemoglobin 16.0; Platelets 189    Lipid Panel     Component Value Date/Time   CHOL 119 08/13/2015 0510   TRIG 142 08/13/2015 0510   HDL 27 (L) 08/13/2015 0510   CHOLHDL 4.4 08/13/2015 0510   VLDL 28 08/13/2015 0510   LDLCALC 64 08/13/2015 0510     Wt Readings from Last 3 Encounters:  03/01/16 210 lb 12.8 oz (95.6 kg)  02/02/16 206 lb (93.4 kg)  01/28/16 203 lb (92.1 kg)      Other studies Reviewed: Additional studies/ records that were reviewed today include: prior cath (nonobstructive CAD, echo, Dr Bronson Ing and Georgann Housekeeper notes  Review of the above records today demonstrates: as above   ASSESSMENT AND PLAN:  1.  Paroxysmal atrial fibrillation The patient has symptomatic recurrent atrial fibrillation.  His chads2vasc score is at least 4.  He is appropriately anticoagulated with xarelto.  He has not tried AADs.  As he does not have structural heart disease or obstructive CAD, I will start flecanide 50mg  BID at this time.  Return in 2 weeks for reassessment.  Continue toprol.  Given underlying lung disease, could consider diltiazem in the future. Would defer  ablation until he has failed medical therapy.  2. Snoring Will order sleep study  3. HTN Stable No change required today  4. Tobacco Cessation advised  Follow-up:  Return to see me in 2 weeks  Current medicines are reviewed at length with the patient today.   The patient does not have concerns regarding his medicines.  The following changes were made today:  none   Signed, Thompson Grayer, MD  03/01/2016 12:25 PM     [place] [address] [phone] (office) [phone] (fax)

## 2016-03-01 NOTE — Telephone Encounter (Signed)
New message       Pt was just seen by Dr Rayann Heman.  The presc prescribed for his heart rhythm is too expensive.  Wife does not know the name of the medication. She want to know if we can get it from the drug company cheaper or can he have a presc for something else?  Please call

## 2016-03-01 NOTE — Telephone Encounter (Signed)
Spoke with the wife and she is going to pick up a 30 day supply tomorrow and will let me know if too expensive.  She says a 90 day supply is 273.00.  She was inquiring if the company had any programs to get at cheaper cost.  Let her know I would ask Vaughan Basta tomorrow and let her know.  She has a discount card and is going to see how much a 30 day supply will cost with her card

## 2016-03-01 NOTE — Patient Instructions (Signed)
Medication Instructions:  Your physician has recommended you make the following change in your medication:  1) Start Flecainide 50 mg twice daily   Labwork: None ordered   Testing/Procedures: Your physician has recommended that you have a sleep study. This test records several body functions during sleep, including: brain activity, eye movement, oxygen and carbon dioxide blood levels, heart rate and rhythm, breathing rate and rhythm, the flow of air through your mouth and nose, snoring, body muscle movements, and chest and belly movement.    Follow-Up: Your physician recommends that you schedule a follow-up appointment in: 2-3 weeks with Dr Rayann Heman   Any Other Special Instructions Will Be Listed Below (If Applicable).     If you need a refill on your cardiac medications before your next appointment, please call your pharmacy.

## 2016-03-06 ENCOUNTER — Telehealth: Payer: Self-pay | Admitting: *Deleted

## 2016-03-06 ENCOUNTER — Telehealth: Payer: Self-pay | Admitting: Cardiovascular Disease

## 2016-03-06 ENCOUNTER — Telehealth: Payer: Self-pay | Admitting: Internal Medicine

## 2016-03-06 NOTE — Telephone Encounter (Signed)
Spoke with Denyse Amass who states that she is working on med assist. For pt.   Two bottles of Xarelto placed at front desk for pt.  Lot [id number]

## 2016-03-06 NOTE — Telephone Encounter (Signed)
-----   Message from Brett Canales, LPN sent at [phone]  2:59 PM EDT ----- Spoke with patient just now. The drug co. Does not have a patient assist program. I gave him websites for [place]. He thinks he may be able to get it for $35.00 through Rx outreach. I told him to call us back if those don't work for him.  Vaughan Basta ----- Message ----- From: Dionicio Stall, RN Sent: 03/02/2016   1:44 PM To: Connye Burkitt Reiland, LPN  Please see is drug co has program for Flecainide and let his wife know.  Thx'Biana Haggar

## 2016-03-06 NOTE — Telephone Encounter (Signed)
New message      Pt wife wants to know after upping the dosage of metoprolol to 75 mg the fluttering is doing well. They want to know if pt should take 1 or 2 of the new meds. Please call.

## 2016-03-06 NOTE — Telephone Encounter (Signed)
Spoke with patient and let him know I would discuss with Dr Rayann Heman and call him back.  He says Dr Rayann Heman told him he can take extra 25 mg of Metoprolol when needed for his afib.  He says the last 3 days he has taken 75 mg and feelss great.  He just picked up the Flecainide and will start tonight.  Just wanted Dr Rayann Heman to be aware

## 2016-03-10 ENCOUNTER — Telehealth: Payer: Self-pay | Admitting: Internal Medicine

## 2016-03-10 NOTE — Telephone Encounter (Signed)
Calling stating that he is taking Metoprolol 50 mg daily and Flecainide 50 mg BID.  States for past 4 days or so he is having more palpitations but they "feel different" and are stronger.  States they are stronger at night and esp when he gets active. States when he took the 75 mg he felt good. Advised that Leonia Reader and Dr. Rayann Heman not in office today but will be in office Monday.  Advised will forward to them to make suggestions and will call him back.

## 2016-03-10 NOTE — Telephone Encounter (Signed)
New Message  Pt c/o medication issue:  1. Name of Medication: toprol & flecainide  2. How are you currently taking this medication (dosage and times per day)? (t)-50 mg tablet daily & (f)-50 mg tablet twice daily  3. Are you having a reaction (difficulty breathing--STAT)? No  4. What is your medication issue? Pts wife voiced when he was on 75 mg of (t) pt voiced he was doing fine, he could tell a huge difference, and pt could sleep during the night.  Pts wife voiced once decreased to 50 mg in (t) and (f) medication recently added pts wife voiced he's had nothing but issues.  Pts wife voiced he's having the same issues as before but more intense and he doesn't feel right.  Please follow up with pt. Thanks!

## 2016-03-13 ENCOUNTER — Encounter (HOSPITAL_COMMUNITY): Payer: Self-pay | Admitting: Psychology

## 2016-03-13 ENCOUNTER — Ambulatory Visit (INDEPENDENT_AMBULATORY_CARE_PROVIDER_SITE_OTHER): Payer: Self-pay | Admitting: Psychology

## 2016-03-13 DIAGNOSIS — G8929 Other chronic pain: Secondary | ICD-10-CM

## 2016-03-13 DIAGNOSIS — F329 Major depressive disorder, single episode, unspecified: Secondary | ICD-10-CM

## 2016-03-13 DIAGNOSIS — M545 Low back pain: Secondary | ICD-10-CM

## 2016-03-13 DIAGNOSIS — F418 Other specified anxiety disorders: Secondary | ICD-10-CM

## 2016-03-13 DIAGNOSIS — F419 Anxiety disorder, unspecified: Secondary | ICD-10-CM

## 2016-03-13 DIAGNOSIS — F4001 Agoraphobia with panic disorder: Secondary | ICD-10-CM
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
## 2016-03-13 NOTE — Telephone Encounter (Signed)
Follow Up Call .  Mrs. Halliwell is returning a call . Thanks

## 2016-03-13 NOTE — Telephone Encounter (Signed)
Discussed with Dr Rayann Heman and it is okay to take the Metoprolol 75 mg daily and stop Flecainide.  He will stop the Flecainide as he feels better on just Metoprolol.  He is going to call his PCP in regards to his tick bite

## 2016-03-15 ENCOUNTER — Telehealth: Payer: Self-pay | Admitting: Internal Medicine

## 2016-03-15 NOTE — Telephone Encounter (Signed)
Will forward to Dr. Klein to review. 

## 2016-03-15 NOTE — Telephone Encounter (Signed)
New Message  Pts wife voiced pt needs labs to see if pt has lime disease.  Pts wife voiced when they went to health department they are having to pay out of pocket and they do not have the money for the test.   Pts wife voiced if the order can be put in by [name] or if the test can be done on pts 9.11 appt because they're covered through [place].  Please follow up with pt. Thanks!

## 2016-03-27 ENCOUNTER — Ambulatory Visit (INDEPENDENT_AMBULATORY_CARE_PROVIDER_SITE_OTHER): Payer: Self-pay | Admitting: Internal Medicine

## 2016-03-27 ENCOUNTER — Encounter: Payer: Self-pay | Admitting: Internal Medicine

## 2016-03-27 VITALS — BP 126/74 | HR 60 | Ht 71.0 in | Wt 209.2 lb

## 2016-03-27 DIAGNOSIS — F419 Anxiety disorder, unspecified: Secondary | ICD-10-CM

## 2016-03-27 DIAGNOSIS — F329 Major depressive disorder, single episode, unspecified: Secondary | ICD-10-CM

## 2016-03-27 DIAGNOSIS — I10 Essential (primary) hypertension: Secondary | ICD-10-CM

## 2016-03-27 DIAGNOSIS — F418 Other specified anxiety disorders: Secondary | ICD-10-CM

## 2016-03-27 DIAGNOSIS — I480 Paroxysmal atrial fibrillation: Secondary | ICD-10-CM

## 2016-03-27 DIAGNOSIS — F17200 Nicotine dependence, unspecified, uncomplicated: Secondary | ICD-10-CM

## 2016-03-27 MED ORDER — FLECAINIDE ACETATE 100 MG PO TABS: 100.0000 mg | ORAL_TABLET | Freq: Two times a day (BID) | ORAL | 3 refills | Status: DC

## 2016-03-27 NOTE — Progress Notes (Signed)
PCP: Raiford Simmonds., PA-C Primary Cardiologist:  Dr Bronson Ing EP:  Dr Rayann Heman  Richard [name] is a 50 y.o. male who presents today for routine electrophysiology followup.  Since last being seen in our clinic, the patient reports doing reasonably well.  He tried flecainide 50mg  BID and did not find this helpful.  He therefore stopped flecainide and has titrated metoprolol to 75mg  daily.  He says that he did well until last night.  He had significant palpitations due to afib last night.  He is very stressed due to finances.  He continues to smoke.  He has significant anxiety.   Today, he denies symptoms of chest pain, shortness of breath,  lower extremity edema, dizziness, presyncope, or syncope.  The patient is otherwise without complaint today.   Past Medical History:  Diagnosis Date  . Arthritis    "legs, spine, shoulders" (08/12/2015)  . Asthma   . Chest pain    + palpitations; cath 2005- 30-40% mid LAD, 20% D1, 20% cx, OM, 20-30% RCA, and EF-55%  . Chronic low back pain   . Colitis 1990  . COPD (chronic obstructive pulmonary disease) ([place])   . Depression   . Gastric ulcer 2003; 2012   2003: + esophagitis; negative H.pylori serology  2012: Dr. Oneida Alar, mild gastritis, Bravo PH probe placement, negative H.pylori  . GERD (gastroesophageal reflux disease)   . Hepatic steatosis   . History of hiatal hernia   . Hyperlipemia   . Hypertension   . Overweight   . Panic attacks   . Paroxysmal atrial fibrillation (HCC)   . Pneumonia 2000s X 2  . Snores   . TIA (transient ischemic attack)    after cath 1/ 17  . Tobacco abuse    1/2 pack per day  . Type II diabetes mellitus ([place])    Past Surgical History:  Procedure Laterality Date  . BRAVO [place] STUDY  05/03/2011   [id number] gastritis/normal esophagus and duodenum  . CARDIAC CATHETERIZATION  1990s X 1; 2005; 08/12/2015  . CARDIAC CATHETERIZATION N/A 08/12/2015   Procedure: Left Heart Cath and Coronary Angiography;  Surgeon: Belva Crome,  MD; LAD 20%, CFX 65%, RCA 20%, EF 60%   . COLONOSCOPY  1990  . ESOPHAGOGASTRODUODENOSCOPY  05/03/2011   [id number] gastritis  . NECK MASS EXCISION Right    "done in dr's office; behind right ear/side of ncek"  . SHOULDER ARTHROSCOPY W/ ROTATOR CUFF REPAIR Right 2006   acromioclavicular joint arthrosis    ROS- all systems are reviewed and negatives except as per HPI above  Current Outpatient Prescriptions  Medication Sig Dispense Refill  . acetaminophen (TYLENOL) 325 MG tablet Take 650 mg by mouth every 6 (six) hours as needed for pain or fever.     [name] albuterol (VENTOLIN HFA) 108 (90 BASE) MCG/ACT inhaler Inhale 2 puffs into the lungs every 6 (six) hours as needed for wheezing or shortness of breath.     . ALPRAZolam (XANAX) 0.5 MG tablet Take one tablet by mouth three times a day and two tablets at night    . amLODipine (NORVASC) 5 MG tablet Take 1 tablet (5 mg total) by mouth daily. 30 tablet 3  . budesonide-formoterol (SYMBICORT) 80-4.5 MCG/ACT inhaler Inhale 2 puffs into the lungs first thing in am and then another 2 puffs about 12 hours later.    . calcium carbonate (TUMS - DOSED IN MG ELEMENTAL CALCIUM) 500 MG chewable tablet Chew 1 tablet by mouth daily as needed for indigestion or  heartburn.    . cyclobenzaprine (FLEXERIL) 10 MG tablet Take 1 tablet (10 mg total) by mouth 3 (three) times daily. 21 tablet 0  . doxycycline (VIBRAMYCIN) 100 MG capsule Take 100 mg by mouth 2 (two) times daily.    [name] esomeprazole (NEXIUM) 40 MG capsule Take 1 capsule (40 mg total) by mouth 2 (two) times daily before a meal. 180 capsule 3  . HYDROcodone-acetaminophen (NORCO/VICODIN) 5-325 MG tablet Take 1 tablet by mouth every 4 (four) hours as needed (pain).    [name] linaclotide (LINZESS) 145 MCG CAPS capsule Take 1 capsule (145 mcg total) by mouth daily before breakfast. 30 capsule 0  . lovastatin (MEVACOR) 20 MG tablet TAKE ONE TABLET BY MOUTH ONCE DAILY AT 6:OO PM 30 tablet 11  . metFORMIN (GLUCOPHAGE) 500 MG  tablet Take 1 tablet (500 mg total) by mouth 2 (two) times daily with a meal. 30 tablet 2  . metoprolol succinate (TOPROL-XL) 50 MG 24 hr tablet Take 75 mg by mouth daily. Take with or immediately following a meal.    . nitroGLYCERIN (NITROSTAT) 0.4 MG SL tablet Place 1 tablet (0.4 mg total) under the tongue every 5 (five) minutes as needed for chest pain. 25 tablet 3  . PARoxetine (PAXIL) 20 MG tablet Take 1 tablet (20 mg total) by mouth at bedtime. 30 tablet 2  . rivaroxaban (XARELTO) 20 MG TABS tablet Take 1 tablet (20 mg total) by mouth daily with supper. 30 tablet 3   No current facility-administered medications for this visit.     Physical Exam: Vitals:   03/27/16 1407  BP: 126/74  Pulse: 60  Weight: 209 lb 3.2 oz (94.9 kg)  Height: 5\' 11"  (1.803 m)    GEN- The patient is well appearing, alert and oriented x 3 today.   Head- normocephalic, atraumatic Eyes-  Sclera clear, conjunctiva pink Ears- hearing intact Oropharynx- clear Lungs- Clear to ausculation bilaterally, normal work of breathing Heart- Regular rate and rhythm, no murmurs, rubs or gallops, PMI not laterally displaced GI- soft, NT, ND, + BS Extremities- no clubbing, cyanosis, or edema  ekg today reveals sinus rhythm 60 bpm, nonspecific St/ T changes  Assessment and Plan:  1. Paroxysmal atrial fibrillation The patient has symptomatic recurrent atrial fibrillation.  Vagal triggers related to diet seem to trigger his afib.  His chads2vasc score is at least 4.  He is appropriately anticoagulated with xarelto.  He has tried flecanide 50mg  BID without improvement.  I have therefore increased flecainide to 100mg  BID today.  I did discuss ablation as an option.  He is not interested in this procedure presently.  I think that given his concern for costs, sotalol may be his only other reasonable AAD option.  2. Snoring Sleep study ordered  3.  HTN Stable No change required today  4. Tobacco  Cessation advised He is  not ready to quit.  Smells heavily of tobacco today  Return to AF clinic in 2 weeks Follow-up with Dr Bronson Ing as scheduled   Thompson Grayer MD, [facility] 03/27/2016 2:36 PM

## 2016-03-27 NOTE — Patient Instructions (Addendum)
Medication Instructions:  Your physician has recommended you make the following change in your medication:  1) Start Flecainide 100 mg twice daily   Labwork: None ordered   Testing/Procedures: None ordered   Follow-Up: Your physician recommends that you schedule a follow-up appointment in: 2 weeks with Roderic Palau, NP   Any Other Special Instructions Will Be Listed Below (If Applicable).     If you need a refill on your cardiac medications before your next appointment, please call your pharmacy.  Please call [phone] if can not do sleep study on 9/19

## 2016-03-29 ENCOUNTER — Telehealth: Payer: Self-pay | Admitting: Internal Medicine

## 2016-03-29 NOTE — Telephone Encounter (Signed)
Discussed with Dr Rayann Heman and he feels he should contact his PCP.  Returned call to wife all of this actually started after he was put on an antibiotic by his [facility].  He has sores in his mouth and this is what happened before.  She is going to contact his PCP

## 2016-03-29 NOTE — Telephone Encounter (Signed)
New message   Pts wife states that pt has some tinkling on his tongue and had a knot in his throat and swelling on his right leg but left leg is not as bad.    Pt c/o swelling: STAT is pt has developed SOB within 24 hours  1. How long have you been experiencing swelling? Started yesterday  2. Where is the swelling located? Right leg and a little in the left leg  3.  Are you currently taking a "fluid pill"? no  4.  Are you currently SOB? Kind of and gets hoarse easy  5.  Have you traveled recently? no

## 2016-04-04 ENCOUNTER — Ambulatory Visit: Payer: Self-pay

## 2016-04-11 ENCOUNTER — Ambulatory Visit (HOSPITAL_COMMUNITY): Payer: Self-pay | Admitting: Nurse Practitioner

## 2016-04-12 ENCOUNTER — Inpatient Hospital Stay (HOSPITAL_COMMUNITY): Admission: RE | Admit: 2016-04-12 | Payer: Self-pay | Source: Ambulatory Visit | Admitting: Nurse Practitioner

## 2016-04-13 ENCOUNTER — Ambulatory Visit (HOSPITAL_COMMUNITY): Payer: Self-pay | Admitting: Psychology

## 2016-04-18 ENCOUNTER — Encounter (HOSPITAL_COMMUNITY): Payer: Self-pay | Admitting: Nurse Practitioner

## 2016-04-18 ENCOUNTER — Ambulatory Visit (HOSPITAL_COMMUNITY)
Admission: RE | Admit: 2016-04-18 | Discharge: 2016-04-18 | Disposition: A | Discharge status: Home / Self Care | Payer: Self-pay | Source: Ambulatory Visit | Attending: Nurse Practitioner | Admitting: Nurse Practitioner

## 2016-04-18 DIAGNOSIS — F419 Anxiety disorder, unspecified: Secondary | ICD-10-CM | POA: Insufficient documentation

## 2016-04-18 DIAGNOSIS — F17210 Nicotine dependence, cigarettes, uncomplicated: Secondary | ICD-10-CM | POA: Insufficient documentation

## 2016-04-18 DIAGNOSIS — F329 Major depressive disorder, single episode, unspecified: Secondary | ICD-10-CM

## 2016-04-18 DIAGNOSIS — J449 Chronic obstructive pulmonary disease, unspecified: Secondary | ICD-10-CM | POA: Insufficient documentation

## 2016-04-18 DIAGNOSIS — Z7901 Long term (current) use of anticoagulants: Secondary | ICD-10-CM | POA: Insufficient documentation

## 2016-04-18 DIAGNOSIS — E119 Type 2 diabetes mellitus without complications: Secondary | ICD-10-CM | POA: Insufficient documentation

## 2016-04-18 DIAGNOSIS — Z818 Family history of other mental and behavioral disorders: Secondary | ICD-10-CM | POA: Insufficient documentation

## 2016-04-18 DIAGNOSIS — F418 Other specified anxiety disorders: Secondary | ICD-10-CM

## 2016-04-18 DIAGNOSIS — Z7951 Long term (current) use of inhaled steroids: Secondary | ICD-10-CM | POA: Insufficient documentation

## 2016-04-18 DIAGNOSIS — R0689 Other abnormalities of breathing: Secondary | ICD-10-CM | POA: Insufficient documentation

## 2016-04-18 DIAGNOSIS — Z888 Allergy status to other drugs, medicaments and biological substances status: Secondary | ICD-10-CM | POA: Insufficient documentation

## 2016-04-18 DIAGNOSIS — Z79899 Other long term (current) drug therapy: Secondary | ICD-10-CM | POA: Insufficient documentation

## 2016-04-18 DIAGNOSIS — K219 Gastro-esophageal reflux disease without esophagitis: Secondary | ICD-10-CM | POA: Insufficient documentation

## 2016-04-18 DIAGNOSIS — F32A Depression, unspecified: Secondary | ICD-10-CM

## 2016-04-18 DIAGNOSIS — Z880 Allergy status to penicillin: Secondary | ICD-10-CM | POA: Insufficient documentation

## 2016-04-18 DIAGNOSIS — Z8249 Family history of ischemic heart disease and other diseases of the circulatory system: Secondary | ICD-10-CM | POA: Insufficient documentation

## 2016-04-18 DIAGNOSIS — Z8673 Personal history of transient ischemic attack (TIA), and cerebral infarction without residual deficits: Secondary | ICD-10-CM | POA: Insufficient documentation

## 2016-04-18 DIAGNOSIS — I10 Essential (primary) hypertension: Secondary | ICD-10-CM | POA: Insufficient documentation

## 2016-04-18 DIAGNOSIS — I480 Paroxysmal atrial fibrillation: Secondary | ICD-10-CM | POA: Insufficient documentation

## 2016-04-18 DIAGNOSIS — Z7984 Long term (current) use of oral hypoglycemic drugs: Secondary | ICD-10-CM | POA: Insufficient documentation

## 2016-04-18 DIAGNOSIS — Z833 Family history of diabetes mellitus: Secondary | ICD-10-CM | POA: Insufficient documentation

## 2016-04-18 DIAGNOSIS — E785 Hyperlipidemia, unspecified: Secondary | ICD-10-CM | POA: Insufficient documentation

## 2016-04-18 DIAGNOSIS — Z8719 Personal history of other diseases of the digestive system: Secondary | ICD-10-CM | POA: Insufficient documentation

## 2016-04-18 LAB — BASIC METABOLIC PANEL
ANION GAP: 6 (ref 5–15)
BUN: 12 mg/dL (ref 6–20)
CALCIUM: 9.1 mg/dL (ref 8.9–10.3)
CO2: 25 mmol/L (ref 22–32)
Chloride: 108 mmol/L (ref 101–111)
Creatinine, Ser: 0.9 mg/dL (ref 0.61–1.24)
GLUCOSE: 236 mg/dL — AB (ref 65–99)
POTASSIUM: 3.6 mmol/L (ref 3.5–5.1)
SODIUM: 139 mmol/L (ref 135–145)

## 2016-04-18 LAB — CBC
HCT: 46.9 % (ref 39.0–52.0)
Hemoglobin: 16.2 g/dL (ref 13.0–17.0)
MCH: 31.3 pg (ref 26.0–34.0)
MCHC: 34.5 g/dL (ref 30.0–36.0)
MCV: 90.7 fL (ref 78.0–100.0)
PLATELETS: 190 10*3/uL (ref 150–400)
RBC: 5.17 MIL/uL (ref 4.22–5.81)
RDW: 12.7 % (ref 11.5–15.5)
WBC: 6.4 10*3/uL (ref 4.0–10.5)

## 2016-04-18 NOTE — Patient Instructions (Signed)
Your physician has recommended you make the following change in your medication:  1)Increase flecainide 75mg  twice a day

## 2016-04-18 NOTE — Progress Notes (Signed)
Primary Care Physician: Raiford Simmonds., PA-C Primary Cardiologist: Bronson Ing Primary Electrophysiologist: Carmela Hurt is a 50 y.o. male with a h/o paroxysmal atrial fibrillation who presents for follow up in the [facility].  Since seeing Dr Rayann Heman last he reports doing relatively well.  He is currently taking Flecainide 50mg  twice daily. He is mostly in SR during the day but has palpitations every night. He reports compliance with Xarelto with no bleeding issues.  He has a sleep study scheduled for this month but is reluctant to do 2/2 financial concerns.  We discussed importance of evaluation today.  He is also under significant financial stress at home.   Today, he denies symptoms of chest pain, shortness of breath, orthopnea, PND, lower extremity edema, dizziness, presyncope, syncope, snoring, daytime somnolence, bleeding, or neurologic sequela. The patient is tolerating medications without difficulties and is otherwise without complaint today.    Past Medical History:  Diagnosis Date  . Arthritis    "legs, spine, shoulders" (08/12/2015)  . Asthma   . Chest pain    + palpitations; cath 2005- 30-40% mid LAD, 20% D1, 20% cx, OM, 20-30% RCA, and EF-55%  . Colitis 1990  . COPD (chronic obstructive pulmonary disease) ([place])   . Depression   . Gastric ulcer 2003; 2012   2003: + esophagitis; negative H.pylori serology  2012: Dr. Oneida Alar, mild gastritis, Bravo PH probe placement, negative H.pylori  . GERD (gastroesophageal reflux disease)   . Hepatic steatosis   . History of hiatal hernia   . Hyperlipemia   . Hypertension   . Overweight   . Panic attacks   . Paroxysmal atrial fibrillation (HCC)   . TIA (transient ischemic attack)    after cath 1/ 17  . Tobacco abuse    1/2 pack per day  . Type II diabetes mellitus ([place])    Past Surgical History:  Procedure Laterality Date  . BRAVO [place] STUDY  05/03/2011   [id number] gastritis/normal  esophagus and duodenum  . CARDIAC CATHETERIZATION  1990s X 1; 2005; 08/12/2015  . CARDIAC CATHETERIZATION N/A 08/12/2015   Procedure: Left Heart Cath and Coronary Angiography;  Surgeon: Belva Crome, MD; LAD 20%, CFX 65%, RCA 20%, EF 60%   . COLONOSCOPY  1990  . ESOPHAGOGASTRODUODENOSCOPY  05/03/2011   [id number] gastritis  . NECK MASS EXCISION Right    "done in dr's office; behind right ear/side of ncek"  . SHOULDER ARTHROSCOPY W/ ROTATOR CUFF REPAIR Right 2006   acromioclavicular joint arthrosis    Current Outpatient Prescriptions  Medication Sig Dispense Refill  . acetaminophen (TYLENOL) 325 MG tablet Take 650 mg by mouth every 6 (six) hours as needed for pain or fever.     [name] albuterol (VENTOLIN HFA) 108 (90 BASE) MCG/ACT inhaler Inhale 2 puffs into the lungs every 6 (six) hours as needed for wheezing or shortness of breath.     . ALPRAZolam (XANAX) 0.5 MG tablet Take one tablet by mouth three times a day and two tablets at night    . amLODipine (NORVASC) 5 MG tablet Take 1 tablet (5 mg total) by mouth daily. 30 tablet 3  . budesonide-formoterol (SYMBICORT) 80-4.5 MCG/ACT inhaler Inhale 2 puffs into the lungs first thing in am and then another 2 puffs about 12 hours later.    . calcium carbonate (TUMS - DOSED IN MG ELEMENTAL CALCIUM) 500 MG chewable tablet Chew 1 tablet by mouth daily as needed for indigestion or heartburn.    [name]  cyclobenzaprine (FLEXERIL) 10 MG tablet Take 1 tablet (10 mg total) by mouth 3 (three) times daily. 21 tablet 0  . esomeprazole (NEXIUM) 40 MG capsule Take 1 capsule (40 mg total) by mouth 2 (two) times daily before a meal. 180 capsule 3  . flecainide (TAMBOCOR) 50 MG tablet Take 75 mg by mouth 2 (two) times daily.    [name] HYDROcodone-acetaminophen (NORCO/VICODIN) 5-325 MG tablet Take 1 tablet by mouth every 4 (four) hours as needed (pain).    [name] linaclotide (LINZESS) 145 MCG CAPS capsule Take 1 capsule (145 mcg total) by mouth daily before breakfast. 30 capsule 0  .  lovastatin (MEVACOR) 20 MG tablet TAKE ONE TABLET BY MOUTH ONCE DAILY AT 6:OO PM 30 tablet 11  . metFORMIN (GLUCOPHAGE) 500 MG tablet Take 1 tablet (500 mg total) by mouth 2 (two) times daily with a meal. 30 tablet 2  . metoprolol succinate (TOPROL-XL) 50 MG 24 hr tablet Take 75 mg by mouth daily. Take with or immediately following a meal.    . nitroGLYCERIN (NITROSTAT) 0.4 MG SL tablet Place 1 tablet (0.4 mg total) under the tongue every 5 (five) minutes as needed for chest pain. 25 tablet 3  . rivaroxaban (XARELTO) 20 MG TABS tablet Take 1 tablet (20 mg total) by mouth daily with supper. 30 tablet 3  . PARoxetine (PAXIL) 20 MG tablet Take 1 tablet (20 mg total) by mouth at bedtime. (Patient not taking: Reported on 04/18/2016) 30 tablet 2   No current facility-administered medications for this encounter.     Allergies  Allergen Reactions  . Dexilant [Dexlansoprazole] Anaphylaxis  . Mushroom Ext Cmplx(Shiitake-Reishi-Mait) Anaphylaxis    Rapid heart rate.  . Penicillins Anaphylaxis    Has patient had a PCN reaction causing immediate rash, facial/tongue/throat swelling, SOB or lightheadedness with hypotension: Yes Has patient had a PCN reaction causing severe rash involving mucus membranes or skin necrosis: No Has patient had a PCN reaction that required hospitalization Yes Has patient had a PCN reaction occurring within the last 10 years: No If all of the above answers are "NO", then may proceed with Cephalosporin use.   [name] Doxycycline     Social History   Social History  . Marital status: Married    Spouse name: N/A  . Number of children: N/A  . Years of education: N/A   Occupational History  . full time Unemployed   Social History Main Topics  . Smoking status: Current Every Day Smoker    Packs/day: 0.50    Years: 25.00    Types: Cigarettes    Start date: 07/17/1982  . Smokeless tobacco: Never Used  . Alcohol use No  . Drug use: No  . Sexual activity: Yes   Other Topics  Concern  . Not on file   Social History Narrative   Pt lives in [place] with wife.  5 children.  Unemployed due to panic attacks and back pain    Family History  Problem Relation Age of Onset  . Lung cancer Mother   . Alcohol abuse Mother   . Heart attack Father 64  . Diabetes Father   . Alcohol abuse Father   . Hypertension Brother   . Hypertension Brother   . Anxiety disorder Sister   . Depression Sister   . Anxiety disorder Sister   . Heart attack Brother 55  . Diabetes Brother   . Hypertension Brother   . Seizures Brother   . Dementia Paternal Uncle   .  Dementia Cousin   . ADD / ADHD Daughter   . Colon cancer Neg Hx   . Drug abuse Neg Hx   . Bipolar disorder Neg Hx   . OCD Neg Hx   . Paranoid behavior Neg Hx   . Schizophrenia Neg Hx   . Sexual abuse Neg Hx   . Physical abuse Neg Hx    ROS- All systems are reviewed and negative except as per the HPI above.  Physical Exam: Vitals:   04/18/16 1019  BP: 130/76  Pulse: 63  Weight: 208 lb 6.4 oz (94.5 kg)  Height: 5\' 11"  (1.803 m)    GEN- The patient is well appearing, alert and oriented x 3 today, +strong tobacco smell.   Head- normocephalic, atraumatic Eyes-  Sclera clear, conjunctiva pink Ears- hearing intact Oropharynx- clear Neck- supple  Lungs- Clear to ausculation bilaterally, normal work of breathing Heart- Regular rate and rhythm, no murmurs, rubs or gallops GI- soft, NT, ND, + BS Extremities- no clubbing, cyanosis, or edema MS- no significant deformity or atrophy Skin- no rash or lesion Psych- euthymic mood, full affect Neuro- strength and sensation are intact  EKG today demonstrates sinus rhythm, rate 63, PR 158msec, QRS 156msec, QTc 425msec Echo 07/2015 demonstrated EF 60-65%, no RWMA, LA 30, +atrial septal aneurysm  Epic records are reviewed at length today  Assessment and Plan:  1. Paroxysmal atrial fibrillation The patient has symptomatic paroxysmal atrial fibrillation He is  currently taking Flecainide 50mg  twice daily with mostly SR during the day but recurrent atrial fibrillation at night.  We have discussed increasing Flecainide to 75mg  twice daily today which he states he is willing to do. Continue Xarelto for CHADS2VASC of 3 He remains reluctant to consider AF ablation  2.  Anxiety  The patient has significant anxiety and is taking Xanax for symptoms. We discussed trying yoga today. He will consider   3. Sleep disordered breathing He is concerned about ability to afford sleep study I have encouraged him to go if he is able  4.  HTN Stable No change required today   Follow up with Dr Bronson Ing as scheduled, AF clinic 3 months  Chanetta Marshall, NP 04/18/2016 10:48 AM

## 2016-05-03 ENCOUNTER — Encounter (HOSPITAL_BASED_OUTPATIENT_CLINIC_OR_DEPARTMENT_OTHER): Payer: Self-pay

## 2016-05-04 ENCOUNTER — Encounter: Payer: Self-pay | Admitting: Cardiovascular Disease

## 2016-05-04 ENCOUNTER — Ambulatory Visit (INDEPENDENT_AMBULATORY_CARE_PROVIDER_SITE_OTHER): Payer: Self-pay | Admitting: Cardiovascular Disease

## 2016-05-04 VITALS — BP 112/58 | HR 67 | Wt 206.0 lb

## 2016-05-04 DIAGNOSIS — F17200 Nicotine dependence, unspecified, uncomplicated: Secondary | ICD-10-CM

## 2016-05-04 DIAGNOSIS — I10 Essential (primary) hypertension: Secondary | ICD-10-CM

## 2016-05-04 DIAGNOSIS — Z23 Encounter for immunization: Secondary | ICD-10-CM

## 2016-05-04 DIAGNOSIS — E7800 Pure hypercholesterolemia, unspecified: Secondary | ICD-10-CM

## 2016-05-04 DIAGNOSIS — Z79899 Other long term (current) drug therapy: Secondary | ICD-10-CM

## 2016-05-04 DIAGNOSIS — I25118 Atherosclerotic heart disease of native coronary artery with other forms of angina pectoris: Secondary | ICD-10-CM

## 2016-05-04 DIAGNOSIS — I480 Paroxysmal atrial fibrillation: Secondary | ICD-10-CM

## 2016-05-04 NOTE — Patient Instructions (Signed)
Medication Instructions:  Your physician recommends that you continue on your current medications as directed. Please refer to the Current Medication list given to you today.   Labwork: none  Testing/Procedures: none  Follow-Up: Your physician recommends that you schedule a follow-up appointment in: AS NEEDED WITH DR. Bronson Ing   KEEP YOUR FOLLOW- UPS WITH A- FIB CLINIC   Any Other Special Instructions Will Be Listed Below (If Applicable).     If you need a refill on your cardiac medications before your next appointment, please call your pharmacy.

## 2016-05-04 NOTE — Progress Notes (Signed)
SUBJECTIVE: Pt has paroxysmal atrial fibrillation. Takes flecainide and Xarelto.   He has nonobstructive CAD. Specifically, the distal left circumflex had a 65% stenosis , the mid to distal LAD had a 20% stenosis, and the proximal to distal RCA had a 20% stenosis. No lesions were hemodynamically significant.  Echocardiography on 08/12/15 demonstrated normal left ventricular systolic function and regional wall motion, LVEF 60-65%.  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  Doing well today and without complaints.  Review of Systems: As per "subjective", otherwise negative.  Allergies  Allergen Reactions  . Dexilant [Dexlansoprazole] Anaphylaxis  . Mushroom Ext Cmplx(Shiitake-Reishi-Mait) Anaphylaxis    Rapid heart rate.  . Penicillins Anaphylaxis    Has patient had a PCN reaction causing immediate rash, facial/tongue/throat swelling, SOB or lightheadedness with hypotension: Yes Has patient had a PCN reaction causing severe rash involving mucus membranes or skin necrosis: No Has patient had a PCN reaction that required hospitalization Yes Has patient had a PCN reaction occurring within the last 10 years: No If all of the above answers are "NO", then may proceed with Cephalosporin use.   [name] Doxycycline     Current Outpatient Prescriptions  Medication Sig Dispense Refill  . acetaminophen (TYLENOL) 325 MG tablet Take 650 mg by mouth every 6 (six) hours as needed for pain or fever.     [name] albuterol (VENTOLIN HFA) 108 (90 BASE) MCG/ACT inhaler Inhale 2 puffs into the lungs every 6 (six) hours as needed for wheezing or shortness of breath.     . ALPRAZolam (XANAX) 0.5 MG tablet Take one tablet by mouth three times a day and two tablets at night    . amLODipine (NORVASC) 5 MG  tablet Take 1 tablet (5 mg total) by mouth daily. 30 tablet 3  . budesonide-formoterol (SYMBICORT) 80-4.5 MCG/ACT inhaler Inhale 2 puffs into the lungs first thing in am and then another 2 puffs about 12 hours later.    . calcium carbonate (TUMS - DOSED IN MG ELEMENTAL CALCIUM) 500 MG chewable tablet Chew 1 tablet by mouth daily as needed for indigestion or heartburn.    . cyclobenzaprine (FLEXERIL) 10 MG tablet Take 1 tablet (10 mg total) by mouth 3 (three) times daily. 21 tablet 0  . esomeprazole (NEXIUM) 40 MG capsule Take 1 capsule (40 mg total) by mouth 2 (two) times daily before a meal. 180 capsule 3  . flecainide (TAMBOCOR) 50 MG tablet Take 75 mg by mouth 2 (two) times daily.    [name] HYDROcodone-acetaminophen (NORCO/VICODIN) 5-325 MG tablet Take 1 tablet by mouth every 4 (four) hours as needed (pain).    [name] linaclotide (LINZESS) 145 MCG CAPS capsule Take 1 capsule (145 mcg total) by mouth daily before breakfast. 30 capsule 0  . lovastatin (MEVACOR) 20 MG tablet TAKE ONE TABLET BY MOUTH ONCE DAILY AT 6:OO PM 30 tablet 11  . metFORMIN (GLUCOPHAGE) 500 MG tablet Take 1 tablet (500 mg total) by mouth 2 (two) times daily with a meal. 30 tablet 2  . metoprolol succinate (TOPROL-XL) 50 MG 24 hr tablet Take 75 mg by mouth daily. Take with or immediately following a meal.    . nitroGLYCERIN (NITROSTAT) 0.4 MG SL tablet Place 1 tablet (0.4 mg total) under the tongue every 5 (five) minutes as needed for chest pain. 25 tablet 3  .  PARoxetine (PAXIL) 20 MG tablet Take 1 tablet (20 mg total) by mouth at bedtime. 30 tablet 2  . rivaroxaban (XARELTO) 20 MG TABS tablet Take 1 tablet (20 mg total) by mouth daily with supper. 30 tablet 3   No current facility-administered medications for this visit.     Past Medical History:  Diagnosis Date  . Arthritis    "legs, spine, shoulders" (08/12/2015)  . Asthma   . Chest pain    + palpitations; cath 2005- 30-40% mid LAD, 20% D1, 20% cx, OM, 20-30% RCA, and EF-55%    . Colitis 1990  . COPD (chronic obstructive pulmonary disease) ([place])   . Depression   . Gastric ulcer 2003; 2012   2003: + esophagitis; negative H.pylori serology  2012: Dr. Oneida Alar, mild gastritis, Bravo PH probe placement, negative H.pylori  . GERD (gastroesophageal reflux disease)   . Hepatic steatosis   . History of hiatal hernia   . Hyperlipemia   . Hypertension   . Overweight   . Panic attacks   . Paroxysmal atrial fibrillation (HCC)   . TIA (transient ischemic attack)    after cath 1/ 17  . Tobacco abuse    1/2 pack per day  . Type II diabetes mellitus ([place])     Past Surgical History:  Procedure Laterality Date  . BRAVO [place] STUDY  05/03/2011   [id number] gastritis/normal esophagus and duodenum  . CARDIAC CATHETERIZATION  1990s X 1; 2005; 08/12/2015  . CARDIAC CATHETERIZATION N/A 08/12/2015   Procedure: Left Heart Cath and Coronary Angiography;  Surgeon: Belva Crome, MD; LAD 20%, CFX 65%, RCA 20%, EF 60%   . COLONOSCOPY  1990  . ESOPHAGOGASTRODUODENOSCOPY  05/03/2011   [id number] gastritis  . NECK MASS EXCISION Right    "done in dr's office; behind right ear/side of ncek"  . SHOULDER ARTHROSCOPY W/ ROTATOR CUFF REPAIR Right 2006   acromioclavicular joint arthrosis    Social History   Social History  . Marital status: Married    Spouse name: N/A  . Number of children: N/A  . Years of education: N/A   Occupational History  . full time Unemployed   Social History Main Topics  . Smoking status: Current Every Day Smoker    Packs/day: 0.50    Years: 25.00    Types: Cigarettes    Start date: 07/17/1982  . Smokeless tobacco: Never Used  . Alcohol use No  . Drug use: No  . Sexual activity: Yes   Other Topics Concern  . Not on file   Social History Narrative   Pt lives in [place] with wife.  5 children.  Unemployed due to panic attacks and back pain     Vitals:   05/04/16 0905  BP: (!) 112/58  Pulse: 67  SpO2: 96%  Weight: 206 lb (93.4 kg)     PHYSICAL EXAM General: NAD HEENT: Normal. Neck: No JVD, no thyromegaly. Lungs: Clear to auscultation bilaterally with normal respiratory effort. CV: Nondisplaced PMI.  Regular rate and rhythm, normal S1/S2, no S3/S4, no murmur. No pretibial or periankle edema.    Abdomen: Soft, nontender, no distention.  Neurologic: Alert and oriented.  Psych: Normal affect. Skin: Normal. Musculoskeletal: No gross deformities.    ECG: Most recent ECG reviewed.      ASSESSMENT AND PLAN: 1. Nonobstructive CAD: Symptomatically stable. Cath results noted above. Continue ASA, metoprolol, and statin. No further workup is indicated.  2. Essential HTN: Controlled on current therapy. No changes.  3. Paroxysmal atrial  fibrillation: Continue flecainide and Xarelto. Follow up in a fib clinic.  4. Hyperlipidemia: TG 204, HDL 28, LDL 77. On lovastatin.   Dispo: f/u with a fib clinic as scheduled. FU with me prn.   Kate Sable, M.D., F.A.C.C.

## 2016-05-08 ENCOUNTER — Encounter (HOSPITAL_COMMUNITY): Payer: Self-pay | Admitting: Psychology

## 2016-05-08 ENCOUNTER — Ambulatory Visit (INDEPENDENT_AMBULATORY_CARE_PROVIDER_SITE_OTHER): Payer: Self-pay | Admitting: Psychology

## 2016-05-08 ENCOUNTER — Encounter (INDEPENDENT_AMBULATORY_CARE_PROVIDER_SITE_OTHER): Payer: Self-pay

## 2016-05-08 DIAGNOSIS — F419 Anxiety disorder, unspecified: Secondary | ICD-10-CM

## 2016-05-08 DIAGNOSIS — F329 Major depressive disorder, single episode, unspecified: Secondary | ICD-10-CM

## 2016-05-08 DIAGNOSIS — F418 Other specified anxiety disorders: Secondary | ICD-10-CM

## 2016-05-08 DIAGNOSIS — F4001 Agoraphobia with panic disorder: Secondary | ICD-10-CM

## 2016-05-08 NOTE — Progress Notes (Signed)
PROGRESS NOTE  Patient:  Richard [name]   DOB: 1965-11-14  MR Number: [id number]  Location: [facility] [address] [place] [address] Dept: [phone]  Start: 10 AM End: 11 AM  Provider/Observer:     Edgardo Roys PSYD  Chief Complaint:      Chief Complaint  Patient presents with  . Anxiety  . Depression    Reason For Service:    The patient was referred by Dr. Lattie Haw because of severe anxiety and foot appear to be panic attacks. The patient reports that he has been having a lot of anxiety and panic attacks. He has numerous medical issues including severe issues with diabetes, COPD, fatty liver disease and orthopedic issues. He also has had a long history of depression anxiety as well. Anxiety and depression have been going on for least 5 years and correlate with his health issues and financial difficulties. The patient relates this deterioration to an injury at work where he injured both of his shoulders while pulling a pallet. He had surgery on one of his shoulders initially and he experiences around his injury and surgery began to develop panic attacks. The patient is also been diagnosed with COPD and diabetes but continues to smoke and fears that if he completely quit smoking that he will gain a lot of weight again.  The patient returns reporting that he has continued to struggle with panic events as well as his significant medical issues and chronic pain.  He has been working on Radiographer, therapeutic very consistently and has reduced the frequency of his panic events and anxiety symptoms.   Interventions Strategy:  Cognitive/behavioral psychotherapeutic interventions  Participation Level:   Active  Participation Quality:  Appropriate      Behavioral Observation:  Fairly Groomed, Alert, and Appropriate.   Current Psychosocial Factors: The patient reports that he is still having Afib.   He reports that he is being evicted from his mobile home part.  He does not know what he is going to do.  He is walking on a tread mill up to 10 minutes per day.  Content of Session:   Review current symptoms and continued work on therapeutic interventions around numerous medical issues and panic attacks.  Current Status:   Patient reports that he had continued to have severe pain but reduced panic attacks. pulmonary issues persist.  The patient reports issues from [place] in March.  Cognitive issues described.    Patient Progress:   Stable  Target Goals:   Target goals include reduce the intensity, duration, and frequency of panic attacks, reduce the amount of benzodiazepine or other potentially dependence forming medications.  Last Reviewed:   05/08/2016  Goals Addressed Today:    Today we worked on issues of reducing the intensity and frequency of panic attacks  Impression/Diagnosis:   the patient appears to be doing with panic attacks along with anxiety and depression. He has numerous medical issues including COPD, diabetes, and other issues as well as liver disease. The patient is not taking very good care of himself and it was stressed specifically that he is going to need to quit smoking cigarettes and completely stopped caffeinated and sugar drinks if he is going to have any hope of improving his quality of life. The patient was amenable to initially try to stop the caffeinated drinks and we developed a plan for this. After that we will begin to address the smoking.  Diagnosis:    Axis I:  1. 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 Anxiety and depression          Axis II: No diagnosis       RODENBOUGH,JOHN R, PsyD 05/08/2016

## 2016-05-25 ENCOUNTER — Encounter (HOSPITAL_COMMUNITY): Payer: Self-pay | Admitting: Psychiatry

## 2016-05-25 ENCOUNTER — Ambulatory Visit (INDEPENDENT_AMBULATORY_CARE_PROVIDER_SITE_OTHER): Payer: Self-pay | Admitting: Psychiatry

## 2016-05-25 VITALS — BP 123/78 | HR 57 | Ht 71.0 in | Wt 205.4 lb

## 2016-05-25 DIAGNOSIS — Z79899 Other long term (current) drug therapy: Secondary | ICD-10-CM

## 2016-05-25 DIAGNOSIS — Z888 Allergy status to other drugs, medicaments and biological substances status: Secondary | ICD-10-CM

## 2016-05-25 DIAGNOSIS — Z880 Allergy status to penicillin: Secondary | ICD-10-CM

## 2016-05-25 DIAGNOSIS — F4001 Agoraphobia with panic disorder: Secondary | ICD-10-CM

## 2016-05-25 MED ORDER — PAROXETINE HCL 20 MG PO TABS: 20.0000 mg | ORAL_TABLET | Freq: Every day | ORAL | 2 refills | Status: DC

## 2016-05-25 MED ORDER — ALPRAZOLAM 0.5 MG PO TABS: ORAL_TABLET | ORAL | 2 refills | Status: DC

## 2016-05-25 NOTE — Progress Notes (Signed)
Patient ID: Richard [name], male   DOB: 1965/09/27, 50 y.o.   MRN: [id number] Patient ID: Richard [name], male   DOB: 03-08-1966, 50 y.o.   MRN: [id number] Patient ID: Richard [name], male   DOB: 1965/11/17, 50 y.o.   MRN: [id number] Patient ID: Richard [name], male   DOB: 01/25/1966, 50 y.o.   MRN: [id number] Patient ID: Richard [name], male   DOB: 08-11-1965, 50 y.o.   MRN: [id number] Patient ID: Richard [name], male   DOB: 02-20-1966, 50 y.o.   MRN: [id number] Patient ID: Richard [name], male   DOB: [birth year], 50 y.o.   MRN: [id number] Patient ID: Richard [name], male   DOB: Jul 25, 1965, 50 y.o.   MRN: [id number] Patient ID: Richard [name], male   DOB: [birth year], 50 y.o.   MRN: [id number] Patient ID: Richard [name], male   DOB: February 09, 1966, 50 y.o.   MRN: [id number] Patient ID: Richard [name], male   DOB: Mar 16, 1966, 50 y.o.   MRN: [id number] Patient ID: Richard [name], male   DOB: [birth year], 50 y.o.   MRN: [id number] Patient ID: Richard [name], male   DOB: [birth year], 50 y.o.   MRN: [id number] Patient ID: Richard [name], male   DOB: December 29, 1965, 50 y.o.   MRN: [id number] Patient ID: Richard [name], male   DOB: 11-26-1965, 50 y.o.   MRN: [id number] Patient ID: Richard [name], male   DOB: 08-20-1965, 50 y.o.   MRN: [id number] [facility] [id number] Progress Note Richard [name] MRN: [id number] DOB: 10-08-1965 Age: 50 y.o.  Date: 05/25/2016  Chief Complaint  Patient presents with  . Anxiety  . Depression  . Follow-up   History of Chief Complaint:   This 50 year old Caucasian male who came for his followup appointment. He is currently living with his wife in [place]. He is unemployed and applying for disability.  The patient states that he has been employed for 5 years. 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 He got injured on the job and both shoulders were torn. He has not been able to work ever since and he feels very badly about this. 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  Since he's not been able to work the patient has been having increasingly depressed and anxious. He feels like his body gives out when he is trying to exert himself even in a minor way. He's been in the ER recently for chest pain which was ruled out as a panic attack. He also saw cardiologist this week and will be having a stress test. His blood pressure was too low and his lisinopril was cut down. He is also trying to quit smoking by using electronic cigarettes.  The patient returns after 2 months. He is here with his wife this time. He states he still struggling with atrial fibrillation can always pay for the Flecanide dose that his cardiologist wants him to take. He states that the anxiety makes A. fib worse and vice versa. Sometimes he has to take an extra Xanax. He never got the Paxil filled even though we talked about last time but his wife agrees to get it today. I explained that it would help his generalized level of anxiety and depression. He still very discouraged about his finances and I urged him to re-appeal his disability Depression  Associated symptoms include fatigue.  Past medical history includes anxiety.   Anxiety  Presents for follow-up visit. Symptoms include nervous/anxious behavior.     Review of Systems  Constitutional: Positive for fatigue.  Gastrointestinal: Negative.   Musculoskeletal: Positive for back pain.  Skin: Negative.   Psychiatric/Behavioral: Positive for depression, dysphoric mood and sleep disturbance. The patient is nervous/anxious.     Physical Exam Vitals: BP 123/78 (BP Location: Right Arm, Patient Position: Sitting, Cuff Size: Large)   Pulse (!) 57   Ht 5\' 11"  (1.803 m)   Wt 205 lb 6.4 oz (93.2 kg)   BMI 28.65 kg/m    Past Psychiatric History: Diagnosis: none  Hospitalizations: none  Outpatient Care: none  Substance Abuse Care: none  Self-Mutilation: none  Suicidal Attempts: none   Violent Behaviors: none   Allergies: Allergies  Allergen Reactions  . Dexilant [Dexlansoprazole] Anaphylaxis  . Mushroom Ext Cmplx(Shiitake-Reishi-Mait) Anaphylaxis    Rapid heart rate.  . Penicillins Anaphylaxis    Has patient had a PCN reaction causing immediate rash, facial/tongue/throat swelling, SOB or lightheadedness with hypotension: Yes Has patient had a PCN reaction causing severe rash involving mucus membranes or skin necrosis: No Has patient had a PCN reaction that required hospitalization Yes Has patient had a PCN reaction occurring within the last 10 years: No If all of the above answers are "NO", then may proceed with Cephalosporin use.   [name] Doxycycline    Medical History: Past Medical History:  Diagnosis Date  . Arthritis    "legs, spine, shoulders" (08/12/2015)  . Asthma   . Chest pain    + palpitations; cath 2005- 30-40% mid LAD, 20% D1, 20% cx, OM, 20-30% RCA, and EF-55%  . Colitis 1990  . COPD (chronic obstructive pulmonary disease) ([place])   . Depression   . Gastric ulcer 2003; 2012   2003: + esophagitis; negative H.pylori serology  2012: Dr. Oneida Alar, mild gastritis, Bravo PH probe placement, negative H.pylori  . GERD (gastroesophageal reflux disease)   . Hepatic steatosis   . History of hiatal hernia   . Hyperlipemia   . Hypertension   . Overweight   . Panic attacks   . Paroxysmal atrial fibrillation (HCC)   . TIA (transient ischemic attack)    after cath 1/ 17  . Tobacco abuse    1/2 pack per day  . Type II diabetes mellitus ([place])    Surgical History: Past Surgical History:  Procedure Laterality Date  . BRAVO [place] STUDY  05/03/2011   [id number] gastritis/normal esophagus and duodenum  . CARDIAC CATHETERIZATION  1990s X 1; 2005; 08/12/2015  . CARDIAC CATHETERIZATION N/A 08/12/2015   Procedure: Left Heart Cath and Coronary Angiography;  Surgeon: Belva Crome, MD; LAD 20%, CFX 65%, RCA 20%, EF 60%   . COLONOSCOPY  1990  . ESOPHAGOGASTRODUODENOSCOPY   05/03/2011   [id number] gastritis  . NECK MASS EXCISION Right    "done in dr's office; behind right ear/side of ncek"  . SHOULDER ARTHROSCOPY W/ ROTATOR CUFF REPAIR Right 2006   acromioclavicular joint arthrosis   Family History: family history includes ADD / ADHD in his daughter; Alcohol abuse in his father and mother; Anxiety disorder in his sister and sister; Dementia in his cousin and paternal uncle; Depression in his sister; Diabetes in his brother and father; Heart attack (age of onset: 19) in his brother; Heart attack (age of onset: 69) in his father; Hypertension in his brother, brother, and brother; Lung cancer in his mother; Seizures in his  brother. Reviewed again today and nothing new.   Current Medications:  Current Outpatient Prescriptions  Medication Sig Dispense Refill  . acetaminophen (TYLENOL) 325 MG tablet Take 650 mg by mouth every 6 (six) hours as needed for pain or fever.     [name] albuterol (VENTOLIN HFA) 108 (90 BASE) MCG/ACT inhaler Inhale 2 puffs into the lungs every 6 (six) hours as needed for wheezing or shortness of breath.     . ALPRAZolam (XANAX) 0.5 MG tablet Take one tablet by mouth three times a day and two tablets at night 150 tablet 2  . amLODipine (NORVASC) 5 MG tablet Take 1 tablet (5 mg total) by mouth daily. 30 tablet 3  . budesonide-formoterol (SYMBICORT) 80-4.5 MCG/ACT inhaler Inhale 2 puffs into the lungs first thing in am and then another 2 puffs about 12 hours later.    . calcium carbonate (TUMS - DOSED IN MG ELEMENTAL CALCIUM) 500 MG chewable tablet Chew 1 tablet by mouth daily as needed for indigestion or heartburn.    . cyclobenzaprine (FLEXERIL) 10 MG tablet Take 1 tablet (10 mg total) by mouth 3 (three) times daily. 21 tablet 0  . esomeprazole (NEXIUM) 40 MG capsule Take 1 capsule (40 mg total) by mouth 2 (two) times daily before a meal. 180 capsule 3  . flecainide (TAMBOCOR) 50 MG tablet Take 75 mg by mouth 2 (two) times daily.    [name]  HYDROcodone-acetaminophen (NORCO/VICODIN) 5-325 MG tablet Take 1 tablet by mouth every 4 (four) hours as needed (pain).    [name] lovastatin (MEVACOR) 20 MG tablet TAKE ONE TABLET BY MOUTH ONCE DAILY AT 6:OO PM 30 tablet 11  . metFORMIN (GLUCOPHAGE) 500 MG tablet Take 1 tablet (500 mg total) by mouth 2 (two) times daily with a meal. 30 tablet 2  . metoprolol succinate (TOPROL-XL) 50 MG 24 hr tablet Take 75 mg by mouth daily. Take with or immediately following a meal.    . nitroGLYCERIN (NITROSTAT) 0.4 MG SL tablet Place 1 tablet (0.4 mg total) under the tongue every 5 (five) minutes as needed for chest pain. 25 tablet 3  . PARoxetine (PAXIL) 20 MG tablet Take 1 tablet (20 mg total) by mouth at bedtime. 30 tablet 2  . rivaroxaban (XARELTO) 20 MG TABS tablet Take 1 tablet (20 mg total) by mouth daily with supper. 30 tablet 3  . linaclotide (LINZESS) 145 MCG CAPS capsule Take 1 capsule (145 mcg total) by mouth daily before breakfast. (Patient not taking: Reported on 05/25/2016) 30 capsule 0   No current facility-administered medications for this visit.     Previous Psychotropic Medications: Medication Dose   Xanax     Substance Abuse History in the last 12 months: Substance Age of 1st Use Last Use Amount Specific Type  Nicotine  17  2 hours ago  1  cigarette  Alcohol  22  38      Cannabis  none        Opiates  36  today  2.5 mg  hyrdocodone  Cocaine  none        Methamphetamines  none        LSD  none        Ecstasy  none         Benzodiazepines  22  started in early thrities  0.5 mg  Xanax  Caffeine  childhood  this AM  1 cup  [place]  Inhalants  none        Others:  Sugar  11  this AM  20 tsps  in the [facility] Consequences of Substance Abuse: pain Legal Consequences of Substance Abuse: none Family Consequences of Substance Abuse: none Blackouts:  No DT's:  No Withdrawal Symptoms:  Yes Tremors  Social History: Patient lives with his life, daughter and her husband with 2  grandbabies.  He has 2 sons and the daughter.  He is a Programmer, systems.    Mental Status Examination/Evaluation: Objective:  Appearance: Casual  Eye Contact::  Good  Speech:  Clear and Coherent  Volume:  Normal  Mood:  anxious   Affect:  Congruent  Thought Process:  Coherent, Intact and Linear  Orientation:  Full (Time, Place, and Person)  Thought Content:  WDL  Suicidal Thoughts:  No  Homicidal Thoughts:  No  Judgement:  Good  Insight:  Fair  Psychomotor Activity:  Normal  Akathisia:  No  Handed:  Right  AIMS (if indicated):    Assets:  Communication Skills Desire for Improvement   Lab Results:  Results for orders placed or performed during the hospital encounter of 04/18/16 (from the past 8736 hour(s))  CBC   Collection Time: 04/18/16 10:44 AM  Result Value Ref Range   WBC 6.4 4.0 - 10.5 K/uL   RBC 5.17 4.22 - 5.81 MIL/uL   Hemoglobin 16.2 13.0 - 17.0 g/dL   HCT 46.9 39.0 - 52.0 %   MCV 90.7 78.0 - 100.0 fL   MCH 31.3 26.0 - 34.0 pg   MCHC 34.5 30.0 - 36.0 g/dL   RDW 12.7 11.5 - 15.5 %   Platelets 190 150 - 400 K/uL  Basic metabolic panel   Collection Time: 04/18/16 10:44 AM  Result Value Ref Range   Sodium 139 135 - 145 mmol/L   Potassium 3.6 3.5 - 5.1 mmol/L   Chloride 108 101 - 111 mmol/L   CO2 25 22 - 32 mmol/L   Glucose, Bld 236 (H) 65 - 99 mg/dL   BUN 12 6 - 20 mg/dL   Creatinine, Ser 0.90 0.61 - 1.24 mg/dL   Calcium 9.1 8.9 - 10.3 mg/dL   GFR calc non Af Amer >60 >60 mL/min   GFR calc Af Amer >60 >60 mL/min   Anion gap 6 5 - 15  Results for orders placed or performed during the hospital encounter of 01/20/16 (from the past 8736 hour(s))  CBC with Differential/Platelet   Collection Time: 01/20/16 10:26 AM  Result Value Ref Range   WBC 7.6 4.0 - 10.5 K/uL   RBC 5.05 4.22 - 5.81 MIL/uL   Hemoglobin 16.0 13.0 - 17.0 g/dL   HCT 45.5 39.0 - 52.0 %   MCV 90.1 78.0 - 100.0 fL   MCH 31.7 26.0 - 34.0 pg   MCHC 35.2 30.0 - 36.0 g/dL   RDW 13.1 11.5 -  15.5 %   Platelets 189 150 - 400 K/uL   Neutrophils Relative % 63 %   Neutro Abs 4.8 1.7 - 7.7 K/uL   Lymphocytes Relative 27 %   Lymphs Abs 2.1 0.7 - 4.0 K/uL   Monocytes Relative 8 %   Monocytes Absolute 0.6 0.1 - 1.0 K/uL   Eosinophils Relative 2 %   Eosinophils Absolute 0.2 0.0 - 0.7 K/uL   Basophils Relative 0 %   Basophils Absolute 0.0 0.0 - 0.1 K/uL  Results for orders placed or performed during the hospital encounter of 01/14/16 (from the past 8736 hour(s))  Basic metabolic panel  Collection Time: 01/14/16  2:31 PM  Result Value Ref Range   Sodium 136 135 - 145 mmol/L   Potassium 3.8 3.5 - 5.1 mmol/L   Chloride 105 101 - 111 mmol/L   CO2 25 22 - 32 mmol/L   Glucose, Bld 128 (H) 65 - 99 mg/dL   BUN 16 6 - 20 mg/dL   Creatinine, Ser 0.94 0.61 - 1.24 mg/dL   Calcium 8.7 (L) 8.9 - 10.3 mg/dL   GFR calc non Af Amer >60 >60 mL/min   GFR calc Af Amer >60 >60 mL/min   Anion gap 6 5 - 15  Magnesium   Collection Time: 01/14/16  2:31 PM  Result Value Ref Range   Magnesium 1.9 1.7 - 2.4 mg/dL  Results for orders placed or performed during the hospital encounter of 08/12/15 (from the past 8736 hour(s))  Glucose, capillary   Collection Time: 08/12/15  6:14 AM  Result Value Ref Range   Glucose-Capillary 124 (H) 65 - 99 mg/dL  Protime-INR   Collection Time: 08/12/15  6:46 AM  Result Value Ref Range   Prothrombin Time 13.1 11.6 - 15.2 seconds   INR 0.97 0.00 - 99991111  Basic metabolic panel   Collection Time: 08/12/15  6:46 AM  Result Value Ref Range   Sodium 138 135 - 145 mmol/L   Potassium 4.2 3.5 - 5.1 mmol/L   Chloride 101 101 - 111 mmol/L   CO2 25 22 - 32 mmol/L   Glucose, Bld 137 (H) 65 - 99 mg/dL   BUN 12 6 - 20 mg/dL   Creatinine, Ser 0.89 0.61 - 1.24 mg/dL   Calcium 9.2 8.9 - 10.3 mg/dL   GFR calc non Af Amer >60 >60 mL/min   GFR calc Af Amer >60 >60 mL/min   Anion gap 12 5 - 15  CBC   Collection Time: 08/12/15  6:46 AM  Result Value Ref Range   WBC 6.6 4.0 -  10.5 K/uL   RBC 4.99 4.22 - 5.81 MIL/uL   Hemoglobin 15.7 13.0 - 17.0 g/dL   HCT 45.0 39.0 - 52.0 %   MCV 90.2 78.0 - 100.0 fL   MCH 31.5 26.0 - 34.0 pg   MCHC 34.9 30.0 - 36.0 g/dL   RDW 12.3 11.5 - 15.5 %   Platelets 232 150 - 400 K/uL  Glucose, capillary   Collection Time: 08/12/15  8:43 AM  Result Value Ref Range   Glucose-Capillary 128 (H) 65 - 99 mg/dL  Glucose, capillary   Collection Time: 08/12/15 12:17 PM  Result Value Ref Range   Glucose-Capillary 155 (H) 65 - 99 mg/dL  APTT   Collection Time: 08/12/15 12:36 PM  Result Value Ref Range   aPTT 30 24 - 37 seconds  Glucose, capillary   Collection Time: 08/12/15  5:58 PM  Result Value Ref Range   Glucose-Capillary 117 (H) 65 - 99 mg/dL  Glucose, capillary   Collection Time: 08/12/15 10:11 PM  Result Value Ref Range   Glucose-Capillary 129 (H) 65 - 99 mg/dL  Hemoglobin A1c   Collection Time: 08/13/15  5:10 AM  Result Value Ref Range   Hgb A1c MFr Bld 7.8 (H) 4.8 - 5.6 %   Mean Plasma Glucose 177 mg/dL  Lipid panel   Collection Time: 08/13/15  5:10 AM  Result Value Ref Range   Cholesterol 119 0 - 200 mg/dL   Triglycerides 142 <150 mg/dL   HDL 27 (L) >40 mg/dL   Total CHOL/HDL Ratio 4.4  RATIO   VLDL 28 0 - 40 mg/dL   LDL Cholesterol 64 0 - 99 mg/dL  Glucose, capillary   Collection Time: 08/13/15  7:21 AM  Result Value Ref Range   Glucose-Capillary 104 (H) 65 - 99 mg/dL   Comment 1 Notify RN    Comment 2 Document in Chart   Glucose, capillary   Collection Time: 08/13/15 11:17 AM  Result Value Ref Range   Glucose-Capillary 171 (H) 65 - 99 mg/dL  Glucose, capillary   Collection Time: 08/13/15  4:25 PM  Result Value Ref Range   Glucose-Capillary 124 (H) 65 - 99 mg/dL  Results for orders placed or performed in visit on 08/11/15 (from the past 8736 hour(s))  Hepatic function panel   Collection Time: 08/31/15  3:54 PM  Result Value Ref Range   Total Bilirubin 0.3 0.2 - 1.2 mg/dL   Bilirubin, Direct 0.1 <=0.2  mg/dL   Indirect Bilirubin 0.2 0.2 - 1.2 mg/dL   Alkaline Phosphatase 53 40 - 115 U/L   AST 17 10 - 35 U/L   ALT 25 9 - 46 U/L   Total Protein 6.8 6.1 - 8.1 g/dL   Albumin 4.3 3.6 - 5.1 g/dL  Results for orders placed or performed in visit on 08/03/15 (from the past 8736 hour(s))  Basic metabolic panel   Collection Time: 08/03/15 12:30 PM  Result Value Ref Range   Sodium 132 (L) 135 - 146 mmol/L   Potassium 4.6 3.5 - 5.3 mmol/L   Chloride 99 98 - 110 mmol/L   CO2 26 20 - 31 mmol/L   Glucose, Bld 179 (H) 65 - 99 mg/dL   BUN 15 7 - 25 mg/dL   Creat 0.85 0.70 - 1.33 mg/dL   Calcium 9.5 8.6 - 10.3 mg/dL    Assessment:   AXIS I Generalized Anxiety Disorder, Major Depression, Recurrent severe and Panic Disorder  AXIS II Deferred  AXIS III Past Medical History:  Diagnosis Date  . Arthritis    "legs, spine, shoulders" (08/12/2015)  . Asthma   . Chest pain    + palpitations; cath 2005- 30-40% mid LAD, 20% D1, 20% cx, OM, 20-30% RCA, and EF-55%  . Colitis 1990  . COPD (chronic obstructive pulmonary disease) ([place])   . Depression   . Gastric ulcer 2003; 2012   2003: + esophagitis; negative H.pylori serology  2012: Dr. Oneida Alar, mild gastritis, Bravo PH probe placement, negative H.pylori  . GERD (gastroesophageal reflux disease)   . Hepatic steatosis   . History of hiatal hernia   . Hyperlipemia   . Hypertension   . Overweight   . Panic attacks   . Paroxysmal atrial fibrillation (HCC)   . TIA (transient ischemic attack)    after cath 1/ 17  . Tobacco abuse    1/2 pack per day  . Type II diabetes mellitus ([place])      AXIS IV other psychosocial or environmental problems  AXIS V 41-50 serious symptoms   Treatment Plan/Recommendations: Laboratory:    Psychotherapy: supportive psychotherapy, he will reschedule with Jenny Reichmann Rodenbaugh   Medications:  He will continue  Xanax 0.5 mg 3 times a day and 1 mg at bedtime. He will Start Paxil 20 mg at bedtime   Routine PRN Medications:  No   Consultations:    Safety Concerns:  none  Other:  Return in 3 months      MEDICATIONS this encounter: Meds ordered this encounter  Medications  . PARoxetine (PAXIL) 20 MG tablet  Sig: Take 1 tablet (20 mg total) by mouth at bedtime.    Dispense:  30 tablet    Refill:  2  . ALPRAZolam (XANAX) 0.5 MG tablet    Sig: Take one tablet by mouth three times a day and two tablets at night    Dispense:  150 tablet    Refill:  2   Medical Decision Making Problem Points:  Established problem, stable/improving (1), Review of last therapy session (1) and Review of psycho-social stressors (1) Data Points:  Review or order clinical lab tests (1) Review of medication regiment & side effects (2) Review of new medications or change in dosage (2)  I certify that outpatient services furnished can reasonably be expected to improve the patient's condition.   [name], MD Patient ID: KEDWIN WHITECOTTON, male   DOB: [birth year], 50 y.o.   MRN: [id number]

## 2016-06-05 ENCOUNTER — Ambulatory Visit (HOSPITAL_COMMUNITY): Payer: Self-pay | Admitting: Psychology

## 2016-07-11 ENCOUNTER — Other Ambulatory Visit: Payer: Self-pay | Admitting: Cardiovascular Disease

## 2016-07-17 ENCOUNTER — Other Ambulatory Visit (HOSPITAL_COMMUNITY): Payer: Self-pay | Admitting: Psychiatry

## 2016-07-19 ENCOUNTER — Inpatient Hospital Stay (HOSPITAL_COMMUNITY): Admission: RE | Admit: 2016-07-19 | Payer: Self-pay | Source: Ambulatory Visit | Admitting: Nurse Practitioner

## 2016-07-26 ENCOUNTER — Inpatient Hospital Stay (HOSPITAL_COMMUNITY): Admission: RE | Admit: 2016-07-26 | Payer: Self-pay | Source: Ambulatory Visit | Admitting: Nurse Practitioner

## 2016-07-28 ENCOUNTER — Encounter (HOSPITAL_COMMUNITY): Payer: Self-pay | Admitting: Nurse Practitioner

## 2016-07-28 ENCOUNTER — Ambulatory Visit (HOSPITAL_COMMUNITY)
Admission: RE | Admit: 2016-07-28 | Discharge: 2016-07-28 | Disposition: A | Discharge status: Home / Self Care | Payer: Self-pay | Source: Ambulatory Visit | Attending: Nurse Practitioner | Admitting: Nurse Practitioner

## 2016-07-28 VITALS — BP 158/88 | HR 68 | Ht 71.0 in | Wt 211.8 lb

## 2016-07-28 DIAGNOSIS — Z880 Allergy status to penicillin: Secondary | ICD-10-CM | POA: Insufficient documentation

## 2016-07-28 DIAGNOSIS — R079 Chest pain, unspecified: Secondary | ICD-10-CM | POA: Insufficient documentation

## 2016-07-28 DIAGNOSIS — K449 Diaphragmatic hernia without obstruction or gangrene: Secondary | ICD-10-CM | POA: Insufficient documentation

## 2016-07-28 DIAGNOSIS — F329 Major depressive disorder, single episode, unspecified: Secondary | ICD-10-CM | POA: Insufficient documentation

## 2016-07-28 DIAGNOSIS — F410 Panic disorder [episodic paroxysmal anxiety] without agoraphobia: Secondary | ICD-10-CM | POA: Insufficient documentation

## 2016-07-28 DIAGNOSIS — Z7984 Long term (current) use of oral hypoglycemic drugs: Secondary | ICD-10-CM | POA: Insufficient documentation

## 2016-07-28 DIAGNOSIS — I499 Cardiac arrhythmia, unspecified: Secondary | ICD-10-CM | POA: Insufficient documentation

## 2016-07-28 DIAGNOSIS — J449 Chronic obstructive pulmonary disease, unspecified: Secondary | ICD-10-CM | POA: Insufficient documentation

## 2016-07-28 DIAGNOSIS — I2510 Atherosclerotic heart disease of native coronary artery without angina pectoris: Secondary | ICD-10-CM | POA: Insufficient documentation

## 2016-07-28 DIAGNOSIS — Z7901 Long term (current) use of anticoagulants: Secondary | ICD-10-CM | POA: Insufficient documentation

## 2016-07-28 DIAGNOSIS — I10 Essential (primary) hypertension: Secondary | ICD-10-CM | POA: Insufficient documentation

## 2016-07-28 DIAGNOSIS — I480 Paroxysmal atrial fibrillation: Secondary | ICD-10-CM | POA: Insufficient documentation

## 2016-07-28 DIAGNOSIS — K219 Gastro-esophageal reflux disease without esophagitis: Secondary | ICD-10-CM | POA: Insufficient documentation

## 2016-07-28 DIAGNOSIS — E119 Type 2 diabetes mellitus without complications: Secondary | ICD-10-CM | POA: Insufficient documentation

## 2016-07-28 DIAGNOSIS — F17210 Nicotine dependence, cigarettes, uncomplicated: Secondary | ICD-10-CM | POA: Insufficient documentation

## 2016-07-28 DIAGNOSIS — E785 Hyperlipidemia, unspecified: Secondary | ICD-10-CM | POA: Insufficient documentation

## 2016-07-28 DIAGNOSIS — Z8673 Personal history of transient ischemic attack (TIA), and cerebral infarction without residual deficits: Secondary | ICD-10-CM | POA: Insufficient documentation

## 2016-07-28 DIAGNOSIS — Z8719 Personal history of other diseases of the digestive system: Secondary | ICD-10-CM | POA: Insufficient documentation

## 2016-07-28 LAB — BASIC METABOLIC PANEL
ANION GAP: 9 (ref 5–15)
BUN: 11 mg/dL (ref 6–20)
CHLORIDE: 103 mmol/L (ref 101–111)
CO2: 25 mmol/L (ref 22–32)
Calcium: 9.2 mg/dL (ref 8.9–10.3)
Creatinine, Ser: 1.05 mg/dL (ref 0.61–1.24)
GFR calc non Af Amer: >60 mL/min (ref >60)
Glucose, Bld: 244 mg/dL — ABNORMAL HIGH (ref 65–99)
POTASSIUM: 4 mmol/L (ref 3.5–5.1)
Sodium: 137 mmol/L (ref 135–145)

## 2016-07-28 LAB — MAGNESIUM: MAGNESIUM: 2 mg/dL (ref 1.7–2.4)

## 2016-07-28 NOTE — Progress Notes (Signed)
Primary Care Physician: Raiford Simmonds., PA-C Referring Physician:   KALIJAH MCCARVILLE is a 51 y.o. male with a h/o CAD, DM, tobacco abuse, panic attacks that is back in afib for f/u of irregular heart beat. Pt gives h/o of having palpitations at times, especially when trying to go to sleepat night. He had a cath with nonobstructive CAD in JAN 2017, He states that he frequently has episodes of chest pain, yesterday had an episode that lasted 2 hours. Did not think to take a SL NTG. He continues to smoke about a pack every 3 days and consumes a lot of caffeine. He states that his nerves will not allow him to stop smoking. He will notice fluttering and will eat 6-8 Tums which will help it to come down.He continues on xarelto and flecainde.  Today, he denies symptoms of palpitations, chest pain, shortness of breath, orthopnea, PND, lower extremity edema, dizziness, presyncope, syncope, or neurologic sequela. The patient is tolerating medications without difficulties and is otherwise without complaint today.   Past Medical History:  Diagnosis Date  . Arthritis    "legs, spine, shoulders" (08/12/2015)  . Asthma   . Chest pain    + palpitations; cath 2005- 30-40% mid LAD, 20% D1, 20% cx, OM, 20-30% RCA, and EF-55%  . Colitis 1990  . COPD (chronic obstructive pulmonary disease) ([place])   . Depression   . Gastric ulcer 2003; 2012   2003: + esophagitis; negative H.pylori serology  2012: Dr. Oneida Alar, mild gastritis, Bravo PH probe placement, negative H.pylori  . GERD (gastroesophageal reflux disease)   . Hepatic steatosis   . History of hiatal hernia   . Hyperlipemia   . Hypertension   . Overweight   . Panic attacks   . Paroxysmal atrial fibrillation ([facility])   . TIA (transient ischemic attack)    after cath 1/ 17  . Tobacco abuse    1/2 pack per day  . Type II diabetes mellitus ([place])    Past Surgical History:  Procedure Laterality Date  . BRAVO [place] STUDY  05/03/2011   [id number] gastritis/normal  esophagus and duodenum  . CARDIAC CATHETERIZATION  1990s X 1; 2005; 08/12/2015  . CARDIAC CATHETERIZATION N/A 08/12/2015   Procedure: Left Heart Cath and Coronary Angiography;  Surgeon: Belva Crome, MD; LAD 20%, CFX 65%, RCA 20%, EF 60%   . COLONOSCOPY  1990  . ESOPHAGOGASTRODUODENOSCOPY  05/03/2011   [id number] gastritis  . NECK MASS EXCISION Right    "done in dr's office; behind right ear/side of ncek"  . SHOULDER ARTHROSCOPY W/ ROTATOR CUFF REPAIR Right 2006   acromioclavicular joint arthrosis    Current Outpatient Prescriptions  Medication Sig Dispense Refill  . acetaminophen (TYLENOL) 325 MG tablet Take 650 mg by mouth every 6 (six) hours as needed for pain or fever.     [name] albuterol (VENTOLIN HFA) 108 (90 BASE) MCG/ACT inhaler Inhale 2 puffs into the lungs every 6 (six) hours as needed for wheezing or shortness of breath.     . ALPRAZolam (XANAX) 0.5 MG tablet Take one tablet by mouth three times a day and two tablets at night 150 tablet 2  . amLODipine (NORVASC) 5 MG tablet Take 1 tablet (5 mg total) by mouth daily. 30 tablet 3  . budesonide-formoterol (SYMBICORT) 80-4.5 MCG/ACT inhaler Inhale 2 puffs into the lungs first thing in am and then another 2 puffs about 12 hours later.    . calcium carbonate (TUMS - DOSED IN MG ELEMENTAL CALCIUM) 500  MG chewable tablet Chew 1 tablet by mouth daily as needed for indigestion or heartburn.    . cyclobenzaprine (FLEXERIL) 10 MG tablet Take 1 tablet (10 mg total) by mouth 3 (three) times daily. 21 tablet 0  . esomeprazole (NEXIUM) 40 MG capsule Take 1 capsule (40 mg total) by mouth 2 (two) times daily before a meal. 180 capsule 3  . flecainide (TAMBOCOR) 50 MG tablet Take 50 mg by mouth 2 (two) times daily.     [name] HYDROcodone-acetaminophen (NORCO/VICODIN) 5-325 MG tablet Take 1 tablet by mouth every 4 (four) hours as needed (pain).    [name] lovastatin (MEVACOR) 20 MG tablet TAKE ONE TABLET BY MOUTH ONCE DAILY AT 6:OO PM 30 tablet 11  . metFORMIN  (GLUCOPHAGE) 500 MG tablet Take 1 tablet (500 mg total) by mouth 2 (two) times daily with a meal. 30 tablet 2  . metoprolol succinate (TOPROL-XL) 50 MG 24 hr tablet Take 75 mg by mouth daily. Take with or immediately following a meal.    . nitroGLYCERIN (NITROSTAT) 0.4 MG SL tablet Place 1 tablet (0.4 mg total) under the tongue every 5 (five) minutes as needed for chest pain. 25 tablet 3  . rivaroxaban (XARELTO) 20 MG TABS tablet Take 1 tablet (20 mg total) by mouth daily with supper. 30 tablet 3  . linaclotide (LINZESS) 145 MCG CAPS capsule Take 1 capsule (145 mcg total) by mouth daily before breakfast. (Patient not taking: Reported on 07/28/2016) 30 capsule 0   No current facility-administered medications for this encounter.     Allergies  Allergen Reactions  . Dexilant [Dexlansoprazole] Anaphylaxis  . Mushroom Ext Cmplx(Shiitake-Reishi-Mait) Anaphylaxis    Rapid heart rate.  . Penicillins Anaphylaxis    Has patient had a PCN reaction causing immediate rash, facial/tongue/throat swelling, SOB or lightheadedness with hypotension: Yes Has patient had a PCN reaction causing severe rash involving mucus membranes or skin necrosis: No Has patient had a PCN reaction that required hospitalization Yes Has patient had a PCN reaction occurring within the last 10 years: No If all of the above answers are "NO", then may proceed with Cephalosporin use.   [name] Doxycycline     Social History   Social History  . Marital status: Married    Spouse name: N/A  . Number of children: N/A  . Years of education: N/A   Occupational History  . full time Unemployed   Social History Main Topics  . Smoking status: Current Every Day Smoker    Packs/day: 0.50    Years: 25.00    Types: Cigarettes    Start date: 07/17/1982  . Smokeless tobacco: Never Used  . Alcohol use No  . Drug use: No  . Sexual activity: Yes   Other Topics Concern  . Not on file   Social History Narrative   Pt lives in [place]  with wife.  5 children.  Unemployed due to panic attacks and back pain    Family History  Problem Relation Age of Onset  . Lung cancer Mother   . Alcohol abuse Mother   . Heart attack Father 7  . Diabetes Father   . Alcohol abuse Father   . Hypertension Brother   . Hypertension Brother   . Anxiety disorder Sister   . Depression Sister   . Anxiety disorder Sister   . Heart attack Brother 42  . Diabetes Brother   . Hypertension Brother   . Seizures Brother   . Dementia Paternal Uncle   .  Dementia Cousin   . ADD / ADHD Daughter   . Colon cancer Neg Hx   . Drug abuse Neg Hx   . Bipolar disorder Neg Hx   . OCD Neg Hx   . Paranoid behavior Neg Hx   . Schizophrenia Neg Hx   . Sexual abuse Neg Hx   . Physical abuse Neg Hx     ROS- All systems are reviewed and negative except as per the HPI above  Physical Exam: Vitals:   07/28/16 1154  BP: (!) 158/88  Pulse: 68  Weight: 211 lb 12.8 oz (96.1 kg)  Height: 5\' 11"  (1.803 m)   Wt Readings from Last 3 Encounters:  07/28/16 211 lb 12.8 oz (96.1 kg)  05/04/16 206 lb (93.4 kg)  04/18/16 208 lb 6.4 oz (94.5 kg)    Labs: Lab Results  Component Value Date   NA 137 07/28/2016   K 4.0 07/28/2016   CL 103 07/28/2016   CO2 25 07/28/2016   GLUCOSE 244 (H) 07/28/2016   BUN 11 07/28/2016   CREATININE 1.05 07/28/2016   CALCIUM 9.2 07/28/2016   PHOS 4.1 01/27/2008   MG 2.0 07/28/2016   Lab Results  Component Value Date   INR 0.97 08/12/2015   Lab Results  Component Value Date   CHOL 119 08/13/2015   HDL 27 (L) 08/13/2015   LDLCALC 64 08/13/2015   TRIG 142 08/13/2015     GEN- The patient is well appearing, alert and oriented x 3 today.   Head- normocephalic, atraumatic Eyes-  Sclera clear, conjunctiva pink Ears- hearing intact Oropharynx- clear Neck- supple, no JVP Lymph- no cervical lymphadenopathy Lungs- Clear to ausculation bilaterally, normal work of breathing Heart- Regular rate and rhythm, no murmurs, rubs  or gallops, PMI not laterally displaced GI- soft, NT, ND, + BS Extremities- no clubbing, cyanosis, or edema MS- no significant deformity or atrophy Skin- no rash or lesion Psych- euthymic mood, full affect Neuro- strength and sensation are intact  EKG- NSR at 68 bpm, Pr int 188 ms qrs int 100 ms, qtc 395 ms Epic records reviewed    Assessment and Plan: 1. Afib  I am not sure if pt is  having premature contractions vrs afib He will continue flecainide 50 mg bid (non obstructive CAD) 48 hour monitor to assess rhythm to determine if PAF vrs PC's Bmet/mag  2. Lifestyle issues Continue to wean off cigarettes Limit amount of regular [place] he drinks(3-4 cans a day), not helping his diabetes control and caffeine contributing to irregular heart beat  3. Chest pain Non obstructive but with frequent episodes of chest pain Reminded that he can NTG sl as needed If frequency increases, inform Dr. Bronson Ing  Will f/u after 48 hour monitor reviewed  Butch Penny C. Sylvio Weatherall, [facility] [address] [place], [place] [id number] [phone]

## 2016-07-31 ENCOUNTER — Ambulatory Visit (HOSPITAL_COMMUNITY)
Admission: RE | Admit: 2016-07-31 | Discharge: 2016-07-31 | Disposition: A | Discharge status: Home / Self Care | Payer: Self-pay | Source: Ambulatory Visit | Attending: Nurse Practitioner | Admitting: Nurse Practitioner

## 2016-07-31 DIAGNOSIS — I480 Paroxysmal atrial fibrillation: Secondary | ICD-10-CM | POA: Insufficient documentation

## 2016-08-16 ENCOUNTER — Encounter (HOSPITAL_COMMUNITY): Payer: Self-pay | Admitting: *Deleted

## 2016-08-23 ENCOUNTER — Inpatient Hospital Stay (HOSPITAL_COMMUNITY): Admission: RE | Admit: 2016-08-23 | Payer: Self-pay | Source: Ambulatory Visit | Admitting: Nurse Practitioner

## 2016-08-24 ENCOUNTER — Ambulatory Visit (INDEPENDENT_AMBULATORY_CARE_PROVIDER_SITE_OTHER): Payer: Self-pay | Admitting: Psychiatry

## 2016-08-24 ENCOUNTER — Encounter (HOSPITAL_COMMUNITY): Payer: Self-pay | Admitting: Psychiatry

## 2016-08-24 VITALS — BP 126/77 | HR 60 | Ht 71.0 in | Wt 213.6 lb

## 2016-08-24 DIAGNOSIS — Z79899 Other long term (current) drug therapy: Secondary | ICD-10-CM

## 2016-08-24 DIAGNOSIS — Z888 Allergy status to other drugs, medicaments and biological substances status: Secondary | ICD-10-CM

## 2016-08-24 DIAGNOSIS — Z880 Allergy status to penicillin: Secondary | ICD-10-CM

## 2016-08-24 DIAGNOSIS — F4001 Agoraphobia with panic disorder: Secondary | ICD-10-CM

## 2016-08-24 DIAGNOSIS — Z9889 Other specified postprocedural states: Secondary | ICD-10-CM

## 2016-08-24 MED ORDER — ALPRAZOLAM 0.5 MG PO TABS: ORAL_TABLET | ORAL | 2 refills | Status: DC

## 2016-08-24 NOTE — Progress Notes (Signed)
Patient ID: Richard [name], male   DOB: 13-Mar-1966, 51 y.o.   MRN: [id number] Patient ID: Richard [name], male   DOB: [birth year], 51 y.o.   MRN: [id number] Patient ID: Richard [name], male   DOB: 08/21/1965, 51 y.o.   MRN: [id number] Patient ID: Richard [name], male   DOB: 24-Jul-1965, 51 y.o.   MRN: [id number] Patient ID: Richard [name], male   DOB: 12/30/1965, 51 y.o.   MRN: [id number] Patient ID: Richard [name], male   DOB: 1966-07-03, 51 y.o.   MRN: [id number] Patient ID: Richard [name], male   DOB: 1966-06-29, 51 y.o.   MRN: [id number] Patient ID: Richard [name], male   DOB: [birth year], 51 y.o.   MRN: [id number] Patient ID: Richard [name], male   DOB: Oct 10, 1965, 51 y.o.   MRN: [id number] Patient ID: Richard [name], male   DOB: 1965-12-13, 51 y.o.   MRN: [id number] Patient ID: Richard [name], male   DOB: [birth year], 51 y.o.   MRN: [id number] Patient ID: Richard [name], male   DOB: 09-02-1965, 51 y.o.   MRN: [id number] Patient ID: Richard [name], male   DOB: 10/28/1965, 51 y.o.   MRN: [id number] Patient ID: Richard [name], male   DOB: December 24, 1965, 51 y.o.   MRN: [id number] Patient ID: Richard [name], male   DOB: 1966-05-23, 51 y.o.   MRN: [id number] Patient ID: Richard [name], male   DOB: 1966/01/03, 51 y.o.   MRN: [id number] [facility] [id number] Progress Note Richard [name] MRN: [id number] DOB: Feb 14, 1966 Age: 51 y.o.  Date: 08/24/2016  Chief Complaint  Patient presents with  . Anxiety  . Follow-up   History of Chief Complaint:   This 51 year old Caucasian male who came for his followup appointment. He is currently living with his wife in [place]. He is unemployed and applying for disability.  The patient states that he has been employed for 5 years. 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 He got injured on the job and both shoulders were torn. He has not been able to work ever since and he feels very badly about this. 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  Since he's not been able to work the patient has been having increasingly depressed and anxious. He feels like his body gives out when he is trying to exert himself even in a minor way. He's been in the ER recently for chest pain which was ruled out as a panic attack. He also saw cardiologist this week and will be having a stress test. His blood pressure was too low and his lisinopril was cut down. He is also trying to quit smoking by using electronic cigarettes.  The patient returns after 3 months. Last time he agreed to try Paxil and his wife is going to pick it up but apparently he doesn't want to take it. The only thing that he thinks is helpful for him as the Xanax. He still very concerned about all of his health issues and his inability to pay for anything. He recently wore a Holter monitor and is going back next week to cardiology to get the result. He seems upset about a number of things today including not getting his disability and feeling that people who get disability don't deserve it etc. Anxiety  Presents for follow-up visit. Symptoms include nervous/anxious behavior.    Depression  Associated symptoms include fatigue.  Past medical history includes anxiety.    Review of Systems  Constitutional: Positive for fatigue.  Gastrointestinal: Negative.   Musculoskeletal: Positive for back pain.  Skin: Negative.   Psychiatric/Behavioral: Positive for depression, dysphoric mood and sleep disturbance. The patient is nervous/anxious.     Physical Exam Vitals: BP 126/77 (BP Location: Right Arm, Patient Position: Sitting, Cuff Size: Large)   Pulse 60   Ht 5\' 11"  (1.803 m)   Wt 213 lb 9.6 oz (96.9 kg)   SpO2 95%   BMI 29.79 kg/m    Past Psychiatric History: Diagnosis: none  Hospitalizations: none  Outpatient Care: none  Substance Abuse Care: none  Self-Mutilation: none  Suicidal Attempts: none  Violent Behaviors: none   Allergies: Allergies   Allergen Reactions  . Dexilant [Dexlansoprazole] Anaphylaxis  . Mushroom Ext Cmplx(Shiitake-Reishi-Mait) Anaphylaxis    Rapid heart rate.  . Penicillins Anaphylaxis    Has patient had a PCN reaction causing immediate rash, facial/tongue/throat swelling, SOB or lightheadedness with hypotension: Yes Has patient had a PCN reaction causing severe rash involving mucus membranes or skin necrosis: No Has patient had a PCN reaction that required hospitalization Yes Has patient had a PCN reaction occurring within the last 10 years: No If all of the above answers are "NO", then may proceed with Cephalosporin use.   [name] Doxycycline    Medical History: Past Medical History:  Diagnosis Date  . Arthritis    "legs, spine, shoulders" (08/12/2015)  . Asthma   . Chest pain    + palpitations; cath 2005- 30-40% mid LAD, 20% D1, 20% cx, OM, 20-30% RCA, and EF-55%  . Colitis 1990  . COPD (chronic obstructive pulmonary disease) ([place])   . Depression   . Gastric ulcer 2003; 2012   2003: + esophagitis; negative H.pylori serology  2012: Dr. Oneida Alar, mild gastritis, Bravo PH probe placement, negative H.pylori  . GERD (gastroesophageal reflux disease)   . Hepatic steatosis   . History of hiatal hernia   . Hyperlipemia   . Hypertension   . Overweight   . Panic attacks   . Paroxysmal atrial fibrillation (HCC)   . TIA (transient ischemic attack)    after cath 1/ 17  . Tobacco abuse    1/2 pack per day  . Type II diabetes mellitus ([place])    Surgical History: Past Surgical History:  Procedure Laterality Date  . BRAVO [place] STUDY  05/03/2011   [id number] gastritis/normal esophagus and duodenum  . CARDIAC CATHETERIZATION  1990s X 1; 2005; 08/12/2015  . CARDIAC CATHETERIZATION N/A 08/12/2015   Procedure: Left Heart Cath and Coronary Angiography;  Surgeon: Belva Crome, MD; LAD 20%, CFX 65%, RCA 20%, EF 60%   . COLONOSCOPY  1990  . ESOPHAGOGASTRODUODENOSCOPY  05/03/2011   [id number] gastritis  . NECK MASS EXCISION  Right    "done in dr's office; behind right ear/side of ncek"  . SHOULDER ARTHROSCOPY W/ ROTATOR CUFF REPAIR Right 2006   acromioclavicular joint arthrosis   Family History: family history includes ADD / ADHD in his daughter; Alcohol abuse in his father and mother; Anxiety disorder in his sister and sister; Dementia in his cousin and paternal uncle; Depression in his sister; Diabetes in his brother and father; Heart attack (age of onset: 33) in his brother; Heart attack (age of onset: 44) in his father; Hypertension in his brother, brother, and brother; Lung cancer in his mother; Seizures in his brother. Reviewed again today and nothing new.   Current  Medications:  Current Outpatient Prescriptions  Medication Sig Dispense Refill  . acetaminophen (TYLENOL) 325 MG tablet Take 650 mg by mouth every 6 (six) hours as needed for pain or fever.     [name] albuterol (VENTOLIN HFA) 108 (90 BASE) MCG/ACT inhaler Inhale 2 puffs into the lungs every 6 (six) hours as needed for wheezing or shortness of breath.     . ALPRAZolam (XANAX) 0.5 MG tablet Take one tablet by mouth three times a day and two tablets at night 150 tablet 2  . amLODipine (NORVASC) 5 MG tablet Take 1 tablet (5 mg total) by mouth daily. 30 tablet 3  . budesonide-formoterol (SYMBICORT) 80-4.5 MCG/ACT inhaler Inhale 2 puffs into the lungs first thing in am and then another 2 puffs about 12 hours later.    . calcium carbonate (TUMS - DOSED IN MG ELEMENTAL CALCIUM) 500 MG chewable tablet Chew 1 tablet by mouth daily as needed for indigestion or heartburn.    . cyclobenzaprine (FLEXERIL) 10 MG tablet Take 1 tablet (10 mg total) by mouth 3 (three) times daily. 21 tablet 0  . esomeprazole (NEXIUM) 40 MG capsule Take 1 capsule (40 mg total) by mouth 2 (two) times daily before a meal. 180 capsule 3  . flecainide (TAMBOCOR) 50 MG tablet Take 50 mg by mouth 2 (two) times daily.     [name] HYDROcodone-acetaminophen (NORCO/VICODIN) 5-325 MG tablet Take 1 tablet  by mouth every 4 (four) hours as needed (pain).    [name] lovastatin (MEVACOR) 20 MG tablet TAKE ONE TABLET BY MOUTH ONCE DAILY AT 6:OO PM 30 tablet 11  . metFORMIN (GLUCOPHAGE) 500 MG tablet Take 1 tablet (500 mg total) by mouth 2 (two) times daily with a meal. 30 tablet 2  . metoprolol succinate (TOPROL-XL) 50 MG 24 hr tablet Take 75 mg by mouth daily. Take with or immediately following a meal.    . nitroGLYCERIN (NITROSTAT) 0.4 MG SL tablet Place 1 tablet (0.4 mg total) under the tongue every 5 (five) minutes as needed for chest pain. 25 tablet 3  . rivaroxaban (XARELTO) 20 MG TABS tablet Take 1 tablet (20 mg total) by mouth daily with supper. 30 tablet 3  . linaclotide (LINZESS) 145 MCG CAPS capsule Take 1 capsule (145 mcg total) by mouth daily before breakfast. (Patient not taking: Reported on 07/28/2016) 30 capsule 0   No current facility-administered medications for this visit.     Previous Psychotropic Medications: Medication Dose   Xanax     Substance Abuse History in the last 12 months: Substance Age of 1st Use Last Use Amount Specific Type  Nicotine  17  2 hours ago  1  cigarette  Alcohol  22  38      Cannabis  none        Opiates  36  today  2.5 mg  hyrdocodone  Cocaine  none        Methamphetamines  none        LSD  none        Ecstasy  none         Benzodiazepines  22  started in early thrities  0.5 mg  Xanax  Caffeine  childhood  this AM  1 cup  [place]  Inhalants  none        Others:      Sugar  11  this AM  20 tsps  in [place] Consequences of Substance Abuse: pain Legal Consequences of Substance Abuse:  none Family Consequences of Substance Abuse: none Blackouts:  No DT's:  No Withdrawal Symptoms:  Yes Tremors  Social History: Patient lives with his life, daughter and her husband with 2 grandbabies.  He has 2 sons and the daughter.  He is a Programmer, systems.    Mental Status Examination/Evaluation: Objective:  Appearance: Casual  Eye Contact::  Good   Speech:  Clear and Coherent  Volume:  Normal  Mood:  anxious   Affect:  Congruent  Thought Process:  Coherent, Intact and Linear  Orientation:  Full (Time, Place, and Person)  Thought Content:  WDL  Suicidal Thoughts:  No  Homicidal Thoughts:  No  Judgement:  Good  Insight:  Fair  Psychomotor Activity:  Normal  Akathisia:  No  Handed:  Right  AIMS (if indicated):    Assets:  Communication Skills Desire for Improvement   Lab Results:  Results for orders placed or performed during the hospital encounter of 07/28/16 (from the past 8736 hour(s))  Basic metabolic panel   Collection Time: 07/28/16 12:15 PM  Result Value Ref Range   Sodium 137 135 - 145 mmol/L   Potassium 4.0 3.5 - 5.1 mmol/L   Chloride 103 101 - 111 mmol/L   CO2 25 22 - 32 mmol/L   Glucose, Bld 244 (H) 65 - 99 mg/dL   BUN 11 6 - 20 mg/dL   Creatinine, Ser 1.05 0.61 - 1.24 mg/dL   Calcium 9.2 8.9 - 10.3 mg/dL   GFR calc non Af Amer >60 >60 mL/min   GFR calc Af Amer >60 >60 mL/min   Anion gap 9 5 - 15  Magnesium   Collection Time: 07/28/16 12:15 PM  Result Value Ref Range   Magnesium 2.0 1.7 - 2.4 mg/dL  Results for orders placed or performed during the hospital encounter of 04/18/16 (from the past 8736 hour(s))  CBC   Collection Time: 04/18/16 10:44 AM  Result Value Ref Range   WBC 6.4 4.0 - 10.5 K/uL   RBC 5.17 4.22 - 5.81 MIL/uL   Hemoglobin 16.2 13.0 - 17.0 g/dL   HCT 46.9 39.0 - 52.0 %   MCV 90.7 78.0 - 100.0 fL   MCH 31.3 26.0 - 34.0 pg   MCHC 34.5 30.0 - 36.0 g/dL   RDW 12.7 11.5 - 15.5 %   Platelets 190 150 - 400 K/uL  Basic metabolic panel   Collection Time: 04/18/16 10:44 AM  Result Value Ref Range   Sodium 139 135 - 145 mmol/L   Potassium 3.6 3.5 - 5.1 mmol/L   Chloride 108 101 - 111 mmol/L   CO2 25 22 - 32 mmol/L   Glucose, Bld 236 (H) 65 - 99 mg/dL   BUN 12 6 - 20 mg/dL   Creatinine, Ser 0.90 0.61 - 1.24 mg/dL   Calcium 9.1 8.9 - 10.3 mg/dL   GFR calc non Af Amer >60 >60 mL/min    GFR calc Af Amer >60 >60 mL/min   Anion gap 6 5 - 15  Results for orders placed or performed during the hospital encounter of 01/20/16 (from the past 8736 hour(s))  CBC with Differential/Platelet   Collection Time: 01/20/16 10:26 AM  Result Value Ref Range   WBC 7.6 4.0 - 10.5 K/uL   RBC 5.05 4.22 - 5.81 MIL/uL   Hemoglobin 16.0 13.0 - 17.0 g/dL   HCT 45.5 39.0 - 52.0 %   MCV 90.1 78.0 - 100.0 fL   MCH 31.7 26.0 - 34.0 pg  MCHC 35.2 30.0 - 36.0 g/dL   RDW 13.1 11.5 - 15.5 %   Platelets 189 150 - 400 K/uL   Neutrophils Relative % 63 %   Neutro Abs 4.8 1.7 - 7.7 K/uL   Lymphocytes Relative 27 %   Lymphs Abs 2.1 0.7 - 4.0 K/uL   Monocytes Relative 8 %   Monocytes Absolute 0.6 0.1 - 1.0 K/uL   Eosinophils Relative 2 %   Eosinophils Absolute 0.2 0.0 - 0.7 K/uL   Basophils Relative 0 %   Basophils Absolute 0.0 0.0 - 0.1 K/uL  Results for orders placed or performed during the hospital encounter of 01/14/16 (from the past 8736 hour(s))  Basic metabolic panel   Collection Time: 01/14/16  2:31 PM  Result Value Ref Range   Sodium 136 135 - 145 mmol/L   Potassium 3.8 3.5 - 5.1 mmol/L   Chloride 105 101 - 111 mmol/L   CO2 25 22 - 32 mmol/L   Glucose, Bld 128 (H) 65 - 99 mg/dL   BUN 16 6 - 20 mg/dL   Creatinine, Ser 0.94 0.61 - 1.24 mg/dL   Calcium 8.7 (L) 8.9 - 10.3 mg/dL   GFR calc non Af Amer >60 >60 mL/min   GFR calc Af Amer >60 >60 mL/min   Anion gap 6 5 - 15  Magnesium   Collection Time: 01/14/16  2:31 PM  Result Value Ref Range   Magnesium 1.9 1.7 - 2.4 mg/dL  Results for orders placed or performed in visit on 08/11/15 (from the past 8736 hour(s))  Hepatic function panel   Collection Time: 08/31/15  3:54 PM  Result Value Ref Range   Total Bilirubin 0.3 0.2 - 1.2 mg/dL   Bilirubin, Direct 0.1 <=0.2 mg/dL   Indirect Bilirubin 0.2 0.2 - 1.2 mg/dL   Alkaline Phosphatase 53 40 - 115 U/L   AST 17 10 - 35 U/L   ALT 25 9 - 46 U/L   Total Protein 6.8 6.1 - 8.1 g/dL   Albumin  4.3 3.6 - 5.1 g/dL    Assessment:   AXIS I Generalized Anxiety Disorder, Major Depression, Recurrent severe and Panic Disorder  AXIS II Deferred  AXIS III Past Medical History:  Diagnosis Date  . Arthritis    "legs, spine, shoulders" (08/12/2015)  . Asthma   . Chest pain    + palpitations; cath 2005- 30-40% mid LAD, 20% D1, 20% cx, OM, 20-30% RCA, and EF-55%  . Colitis 1990  . COPD (chronic obstructive pulmonary disease) ([place])   . Depression   . Gastric ulcer 2003; 2012   2003: + esophagitis; negative H.pylori serology  2012: Dr. Oneida Alar, mild gastritis, Bravo PH probe placement, negative H.pylori  . GERD (gastroesophageal reflux disease)   . Hepatic steatosis   . History of hiatal hernia   . Hyperlipemia   . Hypertension   . Overweight   . Panic attacks   . Paroxysmal atrial fibrillation (HCC)   . TIA (transient ischemic attack)    after cath 1/ 17  . Tobacco abuse    1/2 pack per day  . Type II diabetes mellitus (HCC)      AXIS IV other psychosocial or environmental problems  AXIS V 41-50 serious symptoms   Treatment Plan/Recommendations: Laboratory:    Psychotherapy:    Medications:  He will continue  Xanax 0.5 mg 3 times a day and 1 mg at bedtime.   Routine PRN Medications:  No  Consultations:    Safety Concerns:  none  Other:  Return in 3 months      MEDICATIONS this encounter: Meds ordered this encounter  Medications  . ALPRAZolam (XANAX) 0.5 MG tablet    Sig: Take one tablet by mouth three times a day and two tablets at night    Dispense:  150 tablet    Refill:  2   Medical Decision Making Problem Points:  Established problem, stable/improving (1), Review of last therapy session (1) and Review of psycho-social stressors (1) Data Points:  Review or order clinical lab tests (1) Review of medication regiment & side effects (2) Review of new medications or change in dosage (2)  I certify that outpatient services furnished can reasonably be expected to  improve the patient's condition.   [name], MD Patient ID: Richard [name], male   DOB: May 02, 1966, 51 y.o.   MRN: [id number]

## 2016-08-30 ENCOUNTER — Encounter (HOSPITAL_COMMUNITY): Payer: Self-pay | Admitting: Nurse Practitioner

## 2016-08-30 ENCOUNTER — Ambulatory Visit (HOSPITAL_COMMUNITY)
Admission: RE | Admit: 2016-08-30 | Discharge: 2016-08-30 | Disposition: A | Discharge status: Home / Self Care | Payer: Self-pay | Source: Ambulatory Visit | Attending: Nurse Practitioner | Admitting: Nurse Practitioner

## 2016-08-30 VITALS — BP 130/84 | HR 70 | Ht 71.0 in | Wt 212.8 lb

## 2016-08-30 DIAGNOSIS — E785 Hyperlipidemia, unspecified: Secondary | ICD-10-CM | POA: Insufficient documentation

## 2016-08-30 DIAGNOSIS — F329 Major depressive disorder, single episode, unspecified: Secondary | ICD-10-CM | POA: Insufficient documentation

## 2016-08-30 DIAGNOSIS — F17210 Nicotine dependence, cigarettes, uncomplicated: Secondary | ICD-10-CM | POA: Insufficient documentation

## 2016-08-30 DIAGNOSIS — Z7901 Long term (current) use of anticoagulants: Secondary | ICD-10-CM | POA: Insufficient documentation

## 2016-08-30 DIAGNOSIS — J449 Chronic obstructive pulmonary disease, unspecified: Secondary | ICD-10-CM | POA: Insufficient documentation

## 2016-08-30 DIAGNOSIS — Z8673 Personal history of transient ischemic attack (TIA), and cerebral infarction without residual deficits: Secondary | ICD-10-CM | POA: Insufficient documentation

## 2016-08-30 DIAGNOSIS — I483 Typical atrial flutter: Secondary | ICD-10-CM | POA: Insufficient documentation

## 2016-08-30 DIAGNOSIS — I2510 Atherosclerotic heart disease of native coronary artery without angina pectoris: Secondary | ICD-10-CM | POA: Insufficient documentation

## 2016-08-30 DIAGNOSIS — I10 Essential (primary) hypertension: Secondary | ICD-10-CM | POA: Insufficient documentation

## 2016-08-30 DIAGNOSIS — R079 Chest pain, unspecified: Secondary | ICD-10-CM | POA: Insufficient documentation

## 2016-08-30 DIAGNOSIS — R002 Palpitations: Secondary | ICD-10-CM

## 2016-08-30 DIAGNOSIS — I480 Paroxysmal atrial fibrillation: Secondary | ICD-10-CM | POA: Insufficient documentation

## 2016-08-30 DIAGNOSIS — I499 Cardiac arrhythmia, unspecified: Secondary | ICD-10-CM | POA: Insufficient documentation

## 2016-08-30 DIAGNOSIS — E119 Type 2 diabetes mellitus without complications: Secondary | ICD-10-CM | POA: Insufficient documentation

## 2016-08-30 DIAGNOSIS — K449 Diaphragmatic hernia without obstruction or gangrene: Secondary | ICD-10-CM | POA: Insufficient documentation

## 2016-08-30 DIAGNOSIS — Z7984 Long term (current) use of oral hypoglycemic drugs: Secondary | ICD-10-CM | POA: Insufficient documentation

## 2016-08-30 DIAGNOSIS — K219 Gastro-esophageal reflux disease without esophagitis: Secondary | ICD-10-CM | POA: Insufficient documentation

## 2016-08-30 DIAGNOSIS — Z880 Allergy status to penicillin: Secondary | ICD-10-CM | POA: Insufficient documentation

## 2016-08-30 MED ORDER — FLECAINIDE ACETATE 100 MG PO TABS: 100.0000 mg | ORAL_TABLET | Freq: Two times a day (BID) | ORAL | 3 refills | Status: DC

## 2016-08-30 NOTE — Progress Notes (Signed)
Primary Care Physician: Raiford Simmonds., PA-C Referring Physician: Dr. 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 Pt gives h/o of having palpitations at times, especially when trying to go to sleep at night. He had a cath with nonobstructive CAD in JAN 2017, He states that he frequently has episodes of chest pain, yesterday had an episode that lasted 2 hours. Did not think to take a SL NTG. He continues to smoke about a pack every 3 days and consumes a lot of caffeine, [employer], multiple bottles a day. States " if mountain dew was alcohol, I would be an alcoholic" . He states that his nerves will not allow him to stop smoking or cut back on caffeine. He will notice fluttering and will eat 6-8 Tums which will help it to calm down.He continues on xarelto and flecainide at 50 mg bid. He tried to increase dose to 75 mg bid but could not cut the pill without it shattering. Holter monitor that I ordered on last visit showed PAC's/PVC's and runs of typical atrial flutter.  Today, he denies symptoms of palpitations, chest pain, shortness of breath, orthopnea, PND, lower extremity edema, dizziness, presyncope, syncope, or neurologic sequela. The patient is tolerating medications without difficulties and is otherwise without complaint today.   Past Medical History:  Diagnosis Date  . Arthritis    "legs, spine, shoulders" (08/12/2015)  . Asthma   . Chest pain    + palpitations; cath 2005- 30-40% mid LAD, 20% D1, 20% cx, OM, 20-30% RCA, and EF-55%  . Colitis 1990  . COPD (chronic obstructive pulmonary disease) ([place])   . Depression   . Gastric ulcer 2003; 2012   2003: + esophagitis; negative H.pylori serology  2012: Dr. Oneida Alar, mild gastritis, Bravo PH probe placement, negative H.pylori  . GERD (gastroesophageal reflux disease)   . Hepatic steatosis   . History of hiatal hernia     . Hyperlipemia   . Hypertension   . Overweight   . Panic attacks   . Paroxysmal atrial fibrillation (HCC)   . TIA (transient ischemic attack)    after cath 1/ 17  . Tobacco abuse    1/2 pack per day  . Type II diabetes mellitus ([place])    Past Surgical History:  Procedure Laterality Date  . BRAVO [place] STUDY  05/03/2011   [id number] gastritis/normal esophagus and duodenum  . CARDIAC CATHETERIZATION  1990s X 1; 2005; 08/12/2015  . CARDIAC CATHETERIZATION N/A 08/12/2015   Procedure: Left Heart Cath and Coronary Angiography;  Surgeon: Belva Crome, MD; LAD 20%, CFX 65%, RCA 20%, EF 60%   . COLONOSCOPY  1990  . ESOPHAGOGASTRODUODENOSCOPY  05/03/2011   [id number] gastritis  . NECK MASS EXCISION Right    "done in dr's office; behind right ear/side of ncek"  . SHOULDER ARTHROSCOPY W/ ROTATOR CUFF REPAIR Right 2006   acromioclavicular joint arthrosis    Current Outpatient Prescriptions  Medication Sig Dispense Refill  . acetaminophen (TYLENOL) 325 MG tablet Take 650 mg by mouth every 6 (six) hours as needed for pain or fever.     [name] albuterol (VENTOLIN HFA) 108 (90 BASE) MCG/ACT inhaler Inhale 2 puffs into the lungs every 6 (six) hours as needed for wheezing or shortness of breath.     . ALPRAZolam (XANAX) 0.5 MG tablet Take one tablet by mouth three times  a day and two tablets at night 150 tablet 2  . amLODipine (NORVASC) 5 MG tablet Take 1 tablet (5 mg total) by mouth daily. 30 tablet 3  . budesonide-formoterol (SYMBICORT) 80-4.5 MCG/ACT inhaler Inhale 2 puffs into the lungs first thing in am and then another 2 puffs about 12 hours later.    . calcium carbonate (TUMS - DOSED IN MG ELEMENTAL CALCIUM) 500 MG chewable tablet Chew 1 tablet by mouth daily as needed for indigestion or heartburn.    . cyclobenzaprine (FLEXERIL) 10 MG tablet Take 1 tablet (10 mg total) by mouth 3 (three) times daily. 21 tablet 0  . esomeprazole (NEXIUM) 40 MG capsule Take 1 capsule (40 mg total) by mouth 2 (two) times  daily before a meal. 180 capsule 3  . flecainide (TAMBOCOR) 100 MG tablet Take 1 tablet (100 mg total) by mouth 2 (two) times daily. 60 tablet 3  . HYDROcodone-acetaminophen (NORCO/VICODIN) 5-325 MG tablet Take 1 tablet by mouth every 4 (four) hours as needed (pain).    [name] lovastatin (MEVACOR) 20 MG tablet TAKE ONE TABLET BY MOUTH ONCE DAILY AT 6:OO PM 30 tablet 11  . metFORMIN (GLUCOPHAGE) 500 MG tablet Take 1 tablet (500 mg total) by mouth 2 (two) times daily with a meal. 30 tablet 2  . metoprolol succinate (TOPROL-XL) 50 MG 24 hr tablet Take 75 mg by mouth daily. Take with or immediately following a meal.    . nitroGLYCERIN (NITROSTAT) 0.4 MG SL tablet Place 1 tablet (0.4 mg total) under the tongue every 5 (five) minutes as needed for chest pain. 25 tablet 3  . rivaroxaban (XARELTO) 20 MG TABS tablet Take 1 tablet (20 mg total) by mouth daily with supper. 30 tablet 3  . linaclotide (LINZESS) 145 MCG CAPS capsule Take 1 capsule (145 mcg total) by mouth daily before breakfast. (Patient not taking: Reported on 08/30/2016) 30 capsule 0   No current facility-administered medications for this encounter.     Allergies  Allergen Reactions  . Dexilant [Dexlansoprazole] Anaphylaxis  . Mushroom Ext Cmplx(Shiitake-Reishi-Mait) Anaphylaxis    Rapid heart rate.  . Penicillins Anaphylaxis    Has patient had a PCN reaction causing immediate rash, facial/tongue/throat swelling, SOB or lightheadedness with hypotension: Yes Has patient had a PCN reaction causing severe rash involving mucus membranes or skin necrosis: No Has patient had a PCN reaction that required hospitalization Yes Has patient had a PCN reaction occurring within the last 10 years: No If all of the above answers are "NO", then may proceed with Cephalosporin use.   [name] Doxycycline     Social History   Social History  . Marital status: Married    Spouse name: N/A  . Number of children: N/A  . Years of education: N/A   Occupational  History  . full time Unemployed   Social History Main Topics  . Smoking status: Current Every Day Smoker    Packs/day: 0.50    Years: 25.00    Types: Cigarettes    Start date: 07/17/1982  . Smokeless tobacco: Never Used  . Alcohol use No  . Drug use: No  . Sexual activity: Yes   Other Topics Concern  . Not on file   Social History Narrative   Pt lives in [place] with wife.  5 children.  Unemployed due to panic attacks and back pain    Family History  Problem Relation Age of Onset  . Lung cancer Mother   . Alcohol abuse Mother   .  Heart attack Father 27  . Diabetes Father   . Alcohol abuse Father   . Hypertension Brother   . Hypertension Brother   . Anxiety disorder Sister   . Depression Sister   . Anxiety disorder Sister   . Heart attack Brother 66  . Diabetes Brother   . Hypertension Brother   . Seizures Brother   . Dementia Paternal Uncle   . Dementia Cousin   . ADD / ADHD Daughter   . Colon cancer Neg Hx   . Drug abuse Neg Hx   . Bipolar disorder Neg Hx   . OCD Neg Hx   . Paranoid behavior Neg Hx   . Schizophrenia Neg Hx   . Sexual abuse Neg Hx   . Physical abuse Neg Hx     ROS- All systems are reviewed and negative except as per the HPI above  Physical Exam: Vitals:   08/30/16 1126  BP: 130/84  Pulse: 70  Weight: 212 lb 12.8 oz (96.5 kg)  Height: 5\' 11"  (1.803 m)   Wt Readings from Last 3 Encounters:  08/30/16 212 lb 12.8 oz (96.5 kg)  07/28/16 211 lb 12.8 oz (96.1 kg)  05/04/16 206 lb (93.4 kg)    Labs: Lab Results  Component Value Date   NA 137 07/28/2016   K 4.0 07/28/2016   CL 103 07/28/2016   CO2 25 07/28/2016   GLUCOSE 244 (H) 07/28/2016   BUN 11 07/28/2016   CREATININE 1.05 07/28/2016   CALCIUM 9.2 07/28/2016   PHOS 4.1 01/27/2008   MG 2.0 07/28/2016   Lab Results  Component Value Date   INR 0.97 08/12/2015   Lab Results  Component Value Date   CHOL 119 08/13/2015   HDL 27 (L) 08/13/2015   LDLCALC 64 08/13/2015    TRIG 142 08/13/2015     GEN- The patient is well appearing, alert and oriented x 3 today.   Head- normocephalic, atraumatic Eyes-  Sclera clear, conjunctiva pink Ears- hearing intact Oropharynx- clear Neck- supple, no JVP Lymph- no cervical lymphadenopathy Lungs- Clear to ausculation bilaterally, normal work of breathing Heart- Regular rate and rhythm, no murmurs, rubs or gallops, PMI not laterally displaced GI- soft, NT, ND, + BS Extremities- no clubbing, cyanosis, or edema MS- no significant deformity or atrophy Skin- no rash or lesion Psych- euthymic mood, full affect Neuro- strength and sensation are intact  EKG- NSR at 68 bpm, Pr int 188 ms qrs int 100 ms, qtc 395 ms Epic records reviewed 48 hour monitor: 07/31/16-48 hour Holter monitor reviewed. Predominant rhythm is sinus. Occasional PACs and rare PVCs noted. Heart rate ranged from 50 bpm up to 126 bpm with average heart rate 65 bpm. There were limited episodes of recurring, probable typical atrial flutter with predominantly 2:1 block corresponding to the high heart rates. No definite atrial fibrillation noted. There did appear to be some correlation between description of palpitations and fluttering with atrial flutter events based on diary.     Assessment and Plan: 1. Afib /typical a flutter Monitor reviewed Increase flecainide to 100 mg bid for runs of flutter Offered visit with Dr. Rayann Heman for possible ablation but he states the mention of that  that sets his anxiety into overdrive and he does not want a procedure He is willing to increase flecainide  Continue xarelto   2. Lifestyle issues States he can not stop smoking as his panic attacks would  escalate He will try to limit amount of regular [facility] he  drinks( multiple cans a day), not helping his diabetes control and caffeine contributing to irregular heart beat   3. Chest pain Non obstructive but with frequent episodes of chest pain Reminded that he can  NTG sl as needed If frequency increases, inform Dr. Bronson Ing  Will f/u one week with EKG for increase in flecainide  Butch Penny C. Kaiden Pech, [facility] [address] [place], [place] [address] [phone]

## 2016-08-30 NOTE — Patient Instructions (Signed)
Your physician has recommended you make the following change in your medication:  1)Increase flecainide 100mg  twice a day

## 2016-09-06 ENCOUNTER — Inpatient Hospital Stay (HOSPITAL_COMMUNITY): Admission: RE | Admit: 2016-09-06 | Payer: Self-pay | Source: Ambulatory Visit | Admitting: Nurse Practitioner

## 2016-09-13 ENCOUNTER — Encounter (HOSPITAL_COMMUNITY): Payer: Self-pay | Admitting: Nurse Practitioner

## 2016-09-13 ENCOUNTER — Ambulatory Visit (HOSPITAL_COMMUNITY)
Admission: RE | Admit: 2016-09-13 | Discharge: 2016-09-13 | Disposition: A | Discharge status: Home / Self Care | Payer: Self-pay | Source: Ambulatory Visit | Attending: Nurse Practitioner | Admitting: Nurse Practitioner

## 2016-09-13 VITALS — BP 132/82 | HR 75 | Ht 71.0 in | Wt 212.6 lb

## 2016-09-13 DIAGNOSIS — Z7901 Long term (current) use of anticoagulants: Secondary | ICD-10-CM | POA: Insufficient documentation

## 2016-09-13 DIAGNOSIS — R079 Chest pain, unspecified: Secondary | ICD-10-CM | POA: Insufficient documentation

## 2016-09-13 DIAGNOSIS — M549 Dorsalgia, unspecified: Secondary | ICD-10-CM | POA: Insufficient documentation

## 2016-09-13 DIAGNOSIS — I480 Paroxysmal atrial fibrillation: Secondary | ICD-10-CM | POA: Insufficient documentation

## 2016-09-13 DIAGNOSIS — E785 Hyperlipidemia, unspecified: Secondary | ICD-10-CM | POA: Insufficient documentation

## 2016-09-13 DIAGNOSIS — F17210 Nicotine dependence, cigarettes, uncomplicated: Secondary | ICD-10-CM | POA: Insufficient documentation

## 2016-09-13 DIAGNOSIS — Z7984 Long term (current) use of oral hypoglycemic drugs: Secondary | ICD-10-CM | POA: Insufficient documentation

## 2016-09-13 DIAGNOSIS — I483 Typical atrial flutter: Secondary | ICD-10-CM | POA: Insufficient documentation

## 2016-09-13 DIAGNOSIS — E119 Type 2 diabetes mellitus without complications: Secondary | ICD-10-CM | POA: Insufficient documentation

## 2016-09-13 DIAGNOSIS — F410 Panic disorder [episodic paroxysmal anxiety] without agoraphobia: Secondary | ICD-10-CM | POA: Insufficient documentation

## 2016-09-13 DIAGNOSIS — Z8673 Personal history of transient ischemic attack (TIA), and cerebral infarction without residual deficits: Secondary | ICD-10-CM | POA: Insufficient documentation

## 2016-09-13 DIAGNOSIS — I2510 Atherosclerotic heart disease of native coronary artery without angina pectoris: Secondary | ICD-10-CM | POA: Insufficient documentation

## 2016-09-13 DIAGNOSIS — R002 Palpitations: Secondary | ICD-10-CM

## 2016-09-13 DIAGNOSIS — J449 Chronic obstructive pulmonary disease, unspecified: Secondary | ICD-10-CM | POA: Insufficient documentation

## 2016-09-13 DIAGNOSIS — K449 Diaphragmatic hernia without obstruction or gangrene: Secondary | ICD-10-CM | POA: Insufficient documentation

## 2016-09-13 DIAGNOSIS — I10 Essential (primary) hypertension: Secondary | ICD-10-CM | POA: Insufficient documentation

## 2016-09-13 DIAGNOSIS — F329 Major depressive disorder, single episode, unspecified: Secondary | ICD-10-CM | POA: Insufficient documentation

## 2016-09-13 DIAGNOSIS — I4891 Unspecified atrial fibrillation: Secondary | ICD-10-CM | POA: Insufficient documentation

## 2016-09-13 DIAGNOSIS — K219 Gastro-esophageal reflux disease without esophagitis: Secondary | ICD-10-CM | POA: Insufficient documentation

## 2016-09-13 DIAGNOSIS — Z8719 Personal history of other diseases of the digestive system: Secondary | ICD-10-CM | POA: Insufficient documentation

## 2016-09-13 NOTE — Progress Notes (Signed)
Primary Care Physician: Raiford Simmonds., PA-C Referring Physician: Dr. 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 Pt gives h/o of having palpitations at times, especially when trying to go to sleep at night. He had a cath with nonobstructive CAD in JAN 2017, He states that he frequently has episodes of chest pain, yesterday had an episode that lasted 2 hours. Did not think to take a SL NTG. He continues to smoke about a pack every 3 days and consumes a lot of caffeine, [employer], multiple bottles a day. States " if mountain dew was alcohol, I would be an alcoholic" . He states that his nerves will not allow him to stop smoking or cut back on caffeine. He will notice fluttering and will eat 6-8 Tums which will help it to calm down.He continues on xarelto and flecainide at 50 mg bid. He tried to increase dose to 75 mg bid but could not cut the pill without it shattering. Holter monitor that I ordered on last visit showed PAC's/PVC's and runs of typical atrial flutter.   On last visit, he was agreeable to increase flecainide to 100 mg bid for better control of palpitations. He returns 2/28 and has noticed less palpitations during the day but will still notice some irregularity trying to go to sleep. He has cut down on some of his [facility] and cigarettes.Intervals look ok on increased dose of flecainide.  Today, he denies symptoms of  chest pain, shortness of breath, orthopnea, PND, lower extremity edema, dizziness, presyncope, syncope, or neurologic sequela.  Some palpitations at bedtime.The patient is tolerating medications without difficulties and is otherwise without complaint today.   Past Medical History:  Diagnosis Date  . Arthritis    "legs, spine, shoulders" (08/12/2015)  . Asthma   . Chest pain    + palpitations; cath 2005- 30-40% mid LAD, 20% D1, 20% cx, OM,  20-30% RCA, and EF-55%  . Colitis 1990  . COPD (chronic obstructive pulmonary disease) ([place])   . Depression   . Gastric ulcer 2003; 2012   2003: + esophagitis; negative H.pylori serology  2012: Dr. Oneida Alar, mild gastritis, Bravo PH probe placement, negative H.pylori  . GERD (gastroesophageal reflux disease)   . Hepatic steatosis   . History of hiatal hernia   . Hyperlipemia   . Hypertension   . Overweight   . Panic attacks   . Paroxysmal atrial fibrillation (HCC)   . TIA (transient ischemic attack)    after cath 1/ 17  . Tobacco abuse    1/2 pack per day  . Type II diabetes mellitus ([place])    Past Surgical History:  Procedure Laterality Date  . BRAVO [place] STUDY  05/03/2011   [id number] gastritis/normal esophagus and duodenum  . CARDIAC CATHETERIZATION  1990s X 1; 2005; 08/12/2015  . CARDIAC CATHETERIZATION N/A 08/12/2015   Procedure: Left Heart Cath and Coronary Angiography;  Surgeon: Belva Crome, MD; LAD 20%, CFX 65%, RCA 20%, EF 60%   . COLONOSCOPY  1990  . ESOPHAGOGASTRODUODENOSCOPY  05/03/2011   [id number] gastritis  . NECK MASS EXCISION Right    "done in dr's office; behind right ear/side of ncek"  . SHOULDER ARTHROSCOPY W/ ROTATOR CUFF REPAIR Right 2006   acromioclavicular joint arthrosis    Current Outpatient Prescriptions  Medication Sig Dispense Refill  . acetaminophen (TYLENOL) 325 MG  tablet Take 650 mg by mouth every 6 (six) hours as needed for pain or fever.     [name] albuterol (VENTOLIN HFA) 108 (90 BASE) MCG/ACT inhaler Inhale 2 puffs into the lungs every 6 (six) hours as needed for wheezing or shortness of breath.     . ALPRAZolam (XANAX) 0.5 MG tablet Take one tablet by mouth three times a day and two tablets at night 150 tablet 2  . amLODipine (NORVASC) 5 MG tablet Take 1 tablet (5 mg total) by mouth daily. 30 tablet 3  . budesonide-formoterol (SYMBICORT) 80-4.5 MCG/ACT inhaler Inhale 2 puffs into the lungs first thing in am and then another 2 puffs about 12 hours  later.    . calcium carbonate (TUMS - DOSED IN MG ELEMENTAL CALCIUM) 500 MG chewable tablet Chew 1 tablet by mouth daily as needed for indigestion or heartburn.    . cyclobenzaprine (FLEXERIL) 10 MG tablet Take 1 tablet (10 mg total) by mouth 3 (three) times daily. 21 tablet 0  . esomeprazole (NEXIUM) 40 MG capsule Take 1 capsule (40 mg total) by mouth 2 (two) times daily before a meal. 180 capsule 3  . flecainide (TAMBOCOR) 100 MG tablet Take 1 tablet (100 mg total) by mouth 2 (two) times daily. 60 tablet 3  . HYDROcodone-acetaminophen (NORCO/VICODIN) 5-325 MG tablet Take 1 tablet by mouth every 4 (four) hours as needed (pain).    [name] linaclotide (LINZESS) 145 MCG CAPS capsule Take 1 capsule (145 mcg total) by mouth daily before breakfast. 30 capsule 0  . lovastatin (MEVACOR) 20 MG tablet TAKE ONE TABLET BY MOUTH ONCE DAILY AT 6:OO PM 30 tablet 11  . metFORMIN (GLUCOPHAGE) 500 MG tablet Take 1 tablet (500 mg total) by mouth 2 (two) times daily with a meal. 30 tablet 2  . metoprolol succinate (TOPROL-XL) 50 MG 24 hr tablet Take 75 mg by mouth daily. Take with or immediately following a meal.    . nitroGLYCERIN (NITROSTAT) 0.4 MG SL tablet Place 1 tablet (0.4 mg total) under the tongue every 5 (five) minutes as needed for chest pain. 25 tablet 3  . rivaroxaban (XARELTO) 20 MG TABS tablet Take 1 tablet (20 mg total) by mouth daily with supper. 30 tablet 3   No current facility-administered medications for this encounter.     Allergies  Allergen Reactions  . Dexilant [Dexlansoprazole] Anaphylaxis  . Mushroom Ext Cmplx(Shiitake-Reishi-Mait) Anaphylaxis    Rapid heart rate.  . Penicillins Anaphylaxis    Has patient had a PCN reaction causing immediate rash, facial/tongue/throat swelling, SOB or lightheadedness with hypotension: Yes Has patient had a PCN reaction causing severe rash involving mucus membranes or skin necrosis: No Has patient had a PCN reaction that required hospitalization Yes Has  patient had a PCN reaction occurring within the last 10 years: No If all of the above answers are "NO", then may proceed with Cephalosporin use.   [name] Doxycycline     Social History   Social History  . Marital status: Married    Spouse name: N/A  . Number of children: N/A  . Years of education: N/A   Occupational History  . full time Unemployed   Social History Main Topics  . Smoking status: Current Every Day Smoker    Packs/day: 0.50    Years: 25.00    Types: Cigarettes    Start date: 07/17/1982  . Smokeless tobacco: Never Used  . Alcohol use No  . Drug use: No  . Sexual activity: Yes  Other Topics Concern  . Not on file   Social History Narrative   Pt lives in [place] with wife.  5 children.  Unemployed due to panic attacks and back pain    Family History  Problem Relation Age of Onset  . Lung cancer Mother   . Alcohol abuse Mother   . Heart attack Father 67  . Diabetes Father   . Alcohol abuse Father   . Hypertension Brother   . Hypertension Brother   . Anxiety disorder Sister   . Depression Sister   . Anxiety disorder Sister   . Heart attack Brother 71  . Diabetes Brother   . Hypertension Brother   . Seizures Brother   . Dementia Paternal Uncle   . Dementia Cousin   . ADD / ADHD Daughter   . Colon cancer Neg Hx   . Drug abuse Neg Hx   . Bipolar disorder Neg Hx   . OCD Neg Hx   . Paranoid behavior Neg Hx   . Schizophrenia Neg Hx   . Sexual abuse Neg Hx   . Physical abuse Neg Hx     ROS- All systems are reviewed and negative except as per the HPI above  Physical Exam: Vitals:   09/13/16 1121  BP: 132/82  Pulse: 75  Weight: 212 lb 9.6 oz (96.4 kg)  Height: 5\' 11"  (1.803 m)   Wt Readings from Last 3 Encounters:  09/13/16 212 lb 9.6 oz (96.4 kg)  08/30/16 212 lb 12.8 oz (96.5 kg)  07/28/16 211 lb 12.8 oz (96.1 kg)    Labs: Lab Results  Component Value Date   NA 137 07/28/2016   K 4.0 07/28/2016   CL 103 07/28/2016   CO2 25  07/28/2016   GLUCOSE 244 (H) 07/28/2016   BUN 11 07/28/2016   CREATININE 1.05 07/28/2016   CALCIUM 9.2 07/28/2016   PHOS 4.1 01/27/2008   MG 2.0 07/28/2016   Lab Results  Component Value Date   INR 0.97 08/12/2015   Lab Results  Component Value Date   CHOL 119 08/13/2015   HDL 27 (L) 08/13/2015   LDLCALC 64 08/13/2015   TRIG 142 08/13/2015     GEN- The patient is well appearing, alert and oriented x 3 today.   Head- normocephalic, atraumatic Eyes-  Sclera clear, conjunctiva pink Ears- hearing intact Oropharynx- clear Neck- supple, no JVP Lymph- no cervical lymphadenopathy Lungs- Clear to ausculation bilaterally, normal work of breathing Heart- Regular rate and rhythm, no murmurs, rubs or gallops, PMI not laterally displaced GI- soft, NT, ND, + BS Extremities- no clubbing, cyanosis, or edema MS- no significant deformity or atrophy Skin- no rash or lesion Psych- euthymic mood, full affect Neuro- strength and sensation are intact  EKG- NSR at 75 bpm, Pr int 176 ms qrs int 98 ms, qtc 419 ms Epic records reviewed 48 hour monitor: 07/31/16-48 hour Holter monitor reviewed. Predominant rhythm is sinus. Occasional PACs and rare PVCs noted. Heart rate ranged from 50 bpm up to 126 bpm with average heart rate 65 bpm. There were limited episodes of recurring, probable typical atrial flutter with predominantly 2:1 block corresponding to the high heart rates. No definite atrial fibrillation noted. There did appear to be some correlation between description of palpitations and fluttering with atrial flutter events based on diary.   Assessment and Plan: 1. Afib /typical a flutter Monitor reviewed Continue increased dose of  flecainide to 100 mg bid for runs of flutter Offered visit with Dr.  Allred for possible ablation but he states the mention of that  that sets his anxiety into overdrive and he does not want a procedure Continue xarelto   2. Lifestyle issues States he can not stop  smoking as his panic attacks would  Escalate, but he has cut back since last visit He has also reduced  [facility], he drinks( multiple cans a day)     3. Chest pain Non obstructive but with frequent episodes of chest pain Reminded that he can NTG sl as needed If frequency increases, inform Dr. Bronson Ing  Will f/u  6 months with Dr. Bronson Ing afib clinic as needed  Geroge Baseman. Clif Serio, [facility] [address] [place], [place] [address] [phone]

## 2016-09-29 ENCOUNTER — Telehealth: Payer: Self-pay | Admitting: Gastroenterology

## 2016-09-29 MED ORDER — HYDROCORTISONE 2.5 % RE CREA: 1.0000 "application " | TOPICAL_CREAM | Freq: Two times a day (BID) | RECTAL | 1 refills | Status: DC

## 2016-09-29 NOTE — Telephone Encounter (Signed)
Agree with recommendations, and I have sent in anusol cream to use BID for 7 days. If this is too expensive, may use prep H OTC.

## 2016-09-29 NOTE — Telephone Encounter (Signed)
Pt's wife called to make appt for patient. He needed something after 3pm and needed to be on a day she was off work and needed to be asap. She is aware of OV on 4/11 at 3 and wanted to know what we could recommend for patient to use for rectal pain until he could be seen. Please advise and call [phone]

## 2016-09-29 NOTE — Telephone Encounter (Signed)
Left full message on VM. Told them to call if they have questions.

## 2016-09-29 NOTE — Telephone Encounter (Signed)
I spoke to pt's wife and she said he has hemorrhoids and swollen with some pain/discomfort in his rectum. He strains for a BM which makes it worse. She said he has Linzess but does not take it everyday. I told her to have him take it everyday it would help his BM's. Forwarding to [name], NP to advise!

## 2016-10-25 ENCOUNTER — Encounter: Payer: Self-pay | Admitting: Gastroenterology

## 2016-10-25 ENCOUNTER — Ambulatory Visit (INDEPENDENT_AMBULATORY_CARE_PROVIDER_SITE_OTHER): Payer: Self-pay | Admitting: Gastroenterology

## 2016-10-25 VITALS — BP 122/78 | HR 64 | Temp 98.2°F | Ht 71.0 in | Wt 214.4 lb

## 2016-10-25 DIAGNOSIS — Z1211 Encounter for screening for malignant neoplasm of colon: Secondary | ICD-10-CM

## 2016-10-25 DIAGNOSIS — K5900 Constipation, unspecified: Secondary | ICD-10-CM

## 2016-10-25 NOTE — Assessment & Plan Note (Addendum)
51 year old male with chronic constipation, not taking Linzess 145 as prescribed due to watery stool. Will trial Linzess 72 mcg. Occasionally will feel a "bulge" coming out of his rectum while straining but not present currently. No rectal bleeding. Likely hemorrhoid related. Due for routine screening colonoscopy now.    Proceed with colonoscopy with Dr. Oneida Alar in the near future. The risks, benefits, and alternatives have been discussed in detail with the patient. They state understanding and desire to proceed.  PROPOFOL due to polypharmacy Will request to hold Xarelto for 48 hours prior: ADDENDUM. This was cleared with cardiology.

## 2016-10-25 NOTE — Progress Notes (Signed)
Referring Provider: Raiford Simmonds., PA-C Primary Care Physician:  Raiford Simmonds., PA-C Primary GI: Dr. Oneida Alar   Chief Complaint  Patient presents with  . Hemorrhoids    no bleeding, some discomfort  . Constipation  . Bloated    HPI:   Richard [name] is a 51 y.o. male presenting today with a history of chronic GERD, chronic abdominal pain, chronic constipation. EGD 2012 with gastritis. Still has not had initial colonoscopy. Has been on Nexium once each morning, Linzess historically.   Had some lower abdominal pain a few weeks ago that woke him up. Felt like he had to have a BM but nothing came. States he feels a bulging coming out of his rectum at times. Bristol scale #5-6. Will go 2-3 days and not go at all, then go several times in one day. Linzess 145 seemed to strong but would only take sporadically. Appetite is not good. Will eat cold cut sandwich and this is the only thing that makes him feel like he does not have palpitations. States his afib starts every night around 8:30. Will eat Tums and shuts it down.   Preparation H helps with burning. No rectal bleeding.   Past Medical History:  Diagnosis Date  . Arthritis    "legs, spine, shoulders" (08/12/2015)  . Asthma   . Chest pain    + palpitations; cath 2005- 30-40% mid LAD, 20% D1, 20% cx, OM, 20-30% RCA, and EF-55%  . Colitis 1990  . COPD (chronic obstructive pulmonary disease) ([place])   . Depression   . Gastric ulcer 2003; 2012   2003: + esophagitis; negative H.pylori serology  2012: Dr. Oneida Alar, mild gastritis, Bravo PH probe placement, negative H.pylori  . GERD (gastroesophageal reflux disease)   . Hepatic steatosis   . History of hiatal hernia   . Hyperlipemia   . Hypertension   . Overweight   . Panic attacks   . Paroxysmal atrial fibrillation (HCC)   . TIA (transient ischemic attack)    after cath 1/ 17  . Tobacco abuse    1/2 pack per day  . Type II diabetes mellitus ([place])     Past Surgical History:    Procedure Laterality Date  . BRAVO [place] STUDY  05/03/2011   IRJ:JOAC gastritis/normal esophagus and duodenum  . CARDIAC CATHETERIZATION  1990s X 1; 2005; 08/12/2015  . CARDIAC CATHETERIZATION N/A 08/12/2015   Procedure: Left Heart Cath and Coronary Angiography;  Surgeon: Belva Crome, MD; LAD 20%, CFX 65%, RCA 20%, EF 60%   . COLONOSCOPY  1990  . ESOPHAGOGASTRODUODENOSCOPY  05/03/2011   ZYS:AYTK gastritis  . NECK MASS EXCISION Right    "done in dr's office; behind right ear/side of ncek"  . SHOULDER ARTHROSCOPY W/ ROTATOR CUFF REPAIR Right 2006   acromioclavicular joint arthrosis    Current Outpatient Prescriptions  Medication Sig Dispense Refill  . acetaminophen (TYLENOL) 325 MG tablet Take 650 mg by mouth every 6 (six) hours as needed for pain or fever.     [name] albuterol (VENTOLIN HFA) 108 (90 BASE) MCG/ACT inhaler Inhale 2 puffs into the lungs every 6 (six) hours as needed for wheezing or shortness of breath.     . ALPRAZolam (XANAX) 0.5 MG tablet Take one tablet by mouth three times a day and two tablets at night 150 tablet 2  . amLODipine (NORVASC) 5 MG tablet Take 1 tablet (5 mg total) by mouth daily. 30 tablet 3  . budesonide-formoterol (SYMBICORT) 80-4.5 MCG/ACT inhaler as  needed. Inhale 2 puffs into the lungs first thing in am and then another 2 puffs about 12 hours later.     . calcium carbonate (TUMS - DOSED IN MG ELEMENTAL CALCIUM) 500 MG chewable tablet Chew 1 tablet by mouth daily as needed for indigestion or heartburn.    . cyclobenzaprine (FLEXERIL) 10 MG tablet Take 1 tablet (10 mg total) by mouth 3 (three) times daily. 21 tablet 0  . esomeprazole (NEXIUM) 40 MG capsule Take 1 capsule (40 mg total) by mouth 2 (two) times daily before a meal. 180 capsule 3  . flecainide (TAMBOCOR) 100 MG tablet Take 1 tablet (100 mg total) by mouth 2 (two) times daily. 60 tablet 3  . HYDROcodone-acetaminophen (NORCO/VICODIN) 5-325 MG tablet Take 1 tablet by mouth every 4 (four) hours as needed  (pain).    [name] lovastatin (MEVACOR) 20 MG tablet TAKE ONE TABLET BY MOUTH ONCE DAILY AT 6:OO PM 30 tablet 11  . metFORMIN (GLUCOPHAGE) 500 MG tablet Take 1 tablet (500 mg total) by mouth 2 (two) times daily with a meal. 30 tablet 2  . metoprolol succinate (TOPROL-XL) 50 MG 24 hr tablet Take 75 mg by mouth daily. Take with or immediately following a meal.    . nitroGLYCERIN (NITROSTAT) 0.4 MG SL tablet Place 1 tablet (0.4 mg total) under the tongue every 5 (five) minutes as needed for chest pain. 25 tablet 3  . rivaroxaban (XARELTO) 20 MG TABS tablet Take 1 tablet (20 mg total) by mouth daily with supper. 30 tablet 3  . hydrocortisone (ANUSOL-HC) 2.5 % rectal cream Place 1 application rectally 2 (two) times daily. (Patient not taking: Reported on 10/25/2016) 30 g 1  . linaclotide (LINZESS) 145 MCG CAPS capsule Take 1 capsule (145 mcg total) by mouth daily before breakfast. (Patient not taking: Reported on 10/25/2016) 30 capsule 0   No current facility-administered medications for this visit.     Allergies as of 10/25/2016 - Review Complete 10/25/2016  Allergen Reaction Noted  . Dexilant [dexlansoprazole] Anaphylaxis 01/20/2015  . Mushroom ext cmplx(shiitake-reishi-mait) Anaphylaxis 03/22/2011  . Penicillins Anaphylaxis   . Doxycycline  04/18/2016    Family History  Problem Relation Age of Onset  . Lung cancer Mother   . Alcohol abuse Mother   . Heart attack Father 69  . Diabetes Father   . Alcohol abuse Father   . Hypertension Brother   . Hypertension Brother   . Anxiety disorder Sister   . Depression Sister   . Anxiety disorder Sister   . Heart attack Brother 66  . Diabetes Brother   . Hypertension Brother   . Seizures Brother   . Dementia Paternal Uncle   . Dementia Cousin   . ADD / ADHD Daughter   . Colon cancer Neg Hx   . Drug abuse Neg Hx   . Bipolar disorder Neg Hx   . OCD Neg Hx   . Paranoid behavior Neg Hx   . Schizophrenia Neg Hx   . Sexual abuse Neg Hx   . Physical  abuse Neg Hx     Social History   Social History  . Marital status: Married    Spouse name: N/A  . Number of children: N/A  . Years of education: N/A   Occupational History  . full time Unemployed   Social History Main Topics  . Smoking status: Current Every Day Smoker    Packs/day: 0.50    Years: 25.00    Types: Cigarettes    Start  date: 07/17/1982  . Smokeless tobacco: Never Used  . Alcohol use No  . Drug use: No  . Sexual activity: Yes   Other Topics Concern  . None   Social History Narrative   Pt lives in [place] with wife.  5 children.  Unemployed due to panic attacks and back pain    Review of Systems: Gen: Denies fever, chills, anorexia. Denies fatigue, weakness, weight loss.  CV: see HPI  Resp: Denies dyspnea at rest, cough, wheezing, coughing up blood, and pleurisy. GI: see HPI  Derm: Denies rash, itching, dry skin Psych: +anxiety  Heme: Denies bruising, bleeding, and enlarged lymph nodes.  Physical Exam: BP 122/78   Pulse 64   Temp 98.2 F (36.8 C) (Oral)   Ht 5\' 11"  (1.803 m)   Wt 214 lb 6.4 oz (97.3 kg)   BMI 29.90 kg/m  General:   Alert and oriented. No distress noted. Pleasant and cooperative.  Head:  Normocephalic and atraumatic. Eyes:  Conjuctiva clear without scleral icterus. Mouth:  Oral mucosa pink and moist. Good dentition. No lesions. Heart:  S1, S2 present without murmurs, rubs, or gallops. Regular rate and rhythm. Abdomen:  +BS, soft, non-tender and non-distended. No rebound or guarding. No HSM or masses noted. Msk:  Symmetrical without gross deformities. Normal posture. Extremities:  Without edema. Neurologic:  Alert and  oriented x4 Psych:  Alert and cooperative. Normal mood and affect.

## 2016-10-25 NOTE — Patient Instructions (Addendum)
We will see if we can hold the Xarelto 48 hours before the colonoscopy.   Let's try the Linzess 72 mcg each morning on an empty stomach.   We have scheduled you for a colonoscopy with Dr. Oneida Alar in the near future!

## 2016-10-26 ENCOUNTER — Telehealth: Payer: Self-pay

## 2016-10-26 NOTE — Progress Notes (Signed)
CC'D TO PCP °

## 2016-10-26 NOTE — Telephone Encounter (Signed)
LMOM to call back

## 2016-10-26 NOTE — Telephone Encounter (Signed)
Dr. Bronson Ing,   This patient was seen in our office yesterday by [name], NP.  She would like to schedule him for a colonoscopy with Dr. Oneida Alar in the very near future.  Please advise if he may HOLD Xarelto for 48 hours prior to the colonoscopy.  Thanks so much for your help.   Sincerely,    Jobeth Pangilinan H. Nicole Kindred, LPN

## 2016-10-26 NOTE — Telephone Encounter (Signed)
That should be fine. 

## 2016-10-26 NOTE — Telephone Encounter (Signed)
Noted  

## 2016-10-26 NOTE — Telephone Encounter (Signed)
Forwarding to [name], NP and [facility] Clinical Staff.

## 2016-10-27 ENCOUNTER — Other Ambulatory Visit: Payer: Self-pay

## 2016-10-27 DIAGNOSIS — Z1211 Encounter for screening for malignant neoplasm of colon: Secondary | ICD-10-CM

## 2016-10-27 NOTE — Telephone Encounter (Signed)
Noted on instructions.

## 2016-10-27 NOTE — Telephone Encounter (Signed)
No metformin the morning of the procedure.

## 2016-10-27 NOTE — Telephone Encounter (Signed)
Vicente Males, please advise about pt's diabetic medication before TCS.

## 2016-10-27 NOTE — Telephone Encounter (Signed)
Wife called office. TCS w/propofol with [facility] scheduled for 11/21/16 at 7:30am. Pt or wife will come by office next week to pick up sample prep and instructions. Informed wife for pt to hold Xarelto 48 hours prior to procedure (will hold Xarelto 11/19/16 and 11/20/16) and it was ok with Dr. Bronson Ing. Orders for tcs entered.

## 2016-10-27 NOTE — Telephone Encounter (Signed)
Pre-op appt 11/16/16 at 3:00pm.

## 2016-10-27 NOTE — Telephone Encounter (Signed)
Wife called office back yesterday. She will talk to pt about scheduling tcs and call office back.

## 2016-10-30 ENCOUNTER — Telehealth: Payer: Self-pay

## 2016-10-30 NOTE — Telephone Encounter (Signed)
PT's wife called to ask about the salmonella in eggs. They had gotten some from [employer] and from [place] that had the codes that could have salmonella. Pt had some diarrhea, watery, one day and them soft stool the next. A little abdominal cramping, but no fever. I told her he would probably have a lot of diarrhea and bad cramping and fever if he was affected by the salmonella. She will let us know if he worsens. Sending to [name], who saw pt in the hospital last.

## 2016-10-30 NOTE — Progress Notes (Signed)
REVIEWED-NO ADDITIONAL RECOMMENDATIONS. 

## 2016-10-30 NOTE — Telephone Encounter (Signed)
Agree. Just monitor symptoms. I believe they can take the eggs back to [employer] for a refund.

## 2016-10-30 NOTE — Telephone Encounter (Signed)
PT is aware.

## 2016-11-03 ENCOUNTER — Telehealth: Payer: Self-pay | Admitting: Gastroenterology

## 2016-11-03 NOTE — Telephone Encounter (Signed)
Pt's wife, Steffanie Dunn, called to say that patient is scheduled for colonoscopy on May 3 with [facility] and received a call yesterday from the hospital saying he would have to pay out of pocket because [place] doesn't cover colonoscopies. She can't reach anyone at the hospital and I told her that I believe that Caryl Pina had resigned and not sure if anyone had taken her place or not. I told her to try again at the hospital and I would send a note to my office manager to clarify if this was correct or not. Please call [phone]

## 2016-11-06 NOTE — Telephone Encounter (Signed)
I spoke Richard [name] at the [facility] and she stated the patient told them he had [facility] and it will only cover the PB fees and not the facility fees.  After further evaluation, we found the patient does have [place] assistance and would not require a payment.

## 2016-11-06 NOTE — Telephone Encounter (Signed)
I made the patient aware.  

## 2016-11-13 NOTE — Patient Instructions (Signed)
Richard [name]  11/13/2016     @PREFPERIOPPHARMACY @   Your procedure is scheduled on 11/21/2016.  Report to [facility] at 6:15 A.M.  Call this number if you have problems the morning of surgery:  [phone]   Remember:  Do not eat food or drink liquids after midnight.  Take these medicines the morning of surgery with A SIP OF WATER Albuterol inhaler and bring with you, Xanax, Amlodipine, Flexeril, Nexium  Flecainide, Toprol  DO NOT TAKE DIABETIC MEDICATIONS AM OF PROCEDURE   Do not wear jewelry, make-up or nail polish.  Do not wear lotions, powders, or perfumes, or deoderant.  Do not shave 48 hours prior to surgery.  Men may shave face and neck.  Do not bring valuables to the hospital.  [facility] is not responsible for any belongings or valuables.  Contacts, dentures or bridgework may not be worn into surgery.  Leave your suitcase in the car.  After surgery it may be brought to your room.  For patients admitted to the hospital, discharge time will be determined by your treatment team.  Patients discharged the day of surgery will not be allowed to drive home.    Please read over the following fact sheets that you were given. Anesthesia Post-op Instructions     PATIENT INSTRUCTIONS POST-ANESTHESIA  IMMEDIATELY FOLLOWING SURGERY:  Do not drive or operate machinery for the first twenty four hours after surgery.  Do not make any important decisions for twenty four hours after surgery or while taking narcotic pain medications or sedatives.  If you develop intractable nausea and vomiting or a severe headache please notify your doctor immediately.  FOLLOW-UP:  Please make an appointment with your surgeon as instructed. You do not need to follow up with anesthesia unless specifically instructed to do so.  WOUND CARE INSTRUCTIONS (if applicable):  Keep a dry clean dressing on the anesthesia/puncture wound site if there is drainage.  Once the wound has quit draining you may leave  it open to air.  Generally you should leave the bandage intact for twenty four hours unless there is drainage.  If the epidural site drains for more than 36-48 hours please call the anesthesia department.  QUESTIONS?:  Please feel free to call your physician or the hospital operator if you have any questions, and they will be happy to assist you.      Colonoscopy, Adult A colonoscopy is an exam to look at the entire large intestine. During the exam, a lubricated, bendable tube is inserted into the anus and then passed into the rectum, colon, and other parts of the large intestine. A colonoscopy is often done as a part of normal colorectal screening or in response to certain symptoms, such as anemia, persistent diarrhea, abdominal pain, and blood in the stool. The exam can help screen for and diagnose medical problems, including:  Tumors.  Polyps.  Inflammation.  Areas of bleeding. Tell a health care provider about:  Any allergies you have.  All medicines you are taking, including vitamins, herbs, eye drops, creams, and over-the-counter medicines.  Any problems you or family members have had with anesthetic medicines.  Any blood disorders you have.  Any surgeries you have had.  Any medical conditions you have.  Any problems you have had passing stool. What are the risks? Generally, this is a safe procedure. However, problems may occur, including:  Bleeding.  A tear in the intestine.  A reaction to medicines given during the exam.  Infection (  rare). What happens before the procedure? Eating and drinking restrictions  Follow instructions from your health care provider about eating and drinking, which may include:  A few days before the procedure - follow a low-fiber diet. Avoid nuts, seeds, dried fruit, raw fruits, and vegetables.  1-3 days before the procedure - follow a clear liquid diet. Drink only clear liquids, such as clear broth or bouillon, black coffee or tea,  clear juice, clear soft drinks or sports drinks, gelatin dessert, and popsicles. Avoid any liquids that contain red or purple dye.  On the day of the procedure - do not eat or drink anything during the 2 hours before the procedure, or within the time period that your health care provider recommends. Bowel prep  If you were prescribed an oral bowel prep to clean out your colon:  Take it as told by your health care provider. Starting the day before your procedure, you will need to drink a large amount of medicated liquid. The liquid will cause you to have multiple loose stools until your stool is almost clear or light green.  If your skin or anus gets irritated from diarrhea, you may use these to relieve the irritation:  Medicated wipes, such as adult wet wipes with aloe and vitamin E.  A skin soothing-product like petroleum jelly.  If you vomit while drinking the bowel prep, take a break for up to 60 minutes and then begin the bowel prep again. If vomiting continues and you cannot take the bowel prep without vomiting, call your health care provider. General instructions   Ask your health care provider about changing or stopping your regular medicines. This is especially important if you are taking diabetes medicines or blood thinners.  Plan to have someone take you home from the hospital or clinic. What happens during the procedure?  An IV tube may be inserted into one of your veins.  You will be given medicine to help you relax (sedative).  To reduce your risk of infection:  Your health care team will wash or sanitize their hands.  Your anal area will be washed with soap.  You will be asked to lie on your side with your knees bent.  Your health care provider will lubricate a long, thin, flexible tube. The tube will have a camera and a light on the end.  The tube will be inserted into your anus.  The tube will be gently eased through your rectum and colon.  Air will be  delivered into your colon to keep it open. You may feel some pressure or cramping.  The camera will be used to take images during the procedure.  A small tissue sample may be removed from your body to be examined under a microscope (biopsy). If any potential problems are found, the tissue will be sent to a lab for testing.  If small polyps are found, your health care provider may remove them and have them checked for cancer cells.  The tube that was inserted into your anus will be slowly removed. The procedure may vary among health care providers and hospitals. What happens after the procedure?  Your blood pressure, heart rate, breathing rate, and blood oxygen level will be monitored until the medicines you were given have worn off.  Do not drive for 24 hours after the exam.  You may have a small amount of blood in your stool.  You may pass gas and have mild abdominal cramping or bloating due to the air that  was used to inflate your colon during the exam.  It is up to you to get the results of your procedure. Ask your health care provider, or the department performing the procedure, when your results will be ready. This information is not intended to replace advice given to you by your health care provider. Make sure you discuss any questions you have with your health care provider. Document Released: 06/30/2000 Document Revised: 05/03/2016 Document Reviewed: 09/14/2015 Elsevier Interactive Patient Education  2017 [employer].

## 2016-11-15 ENCOUNTER — Encounter (HOSPITAL_COMMUNITY): Payer: Self-pay | Admitting: Psychiatry

## 2016-11-15 ENCOUNTER — Ambulatory Visit (INDEPENDENT_AMBULATORY_CARE_PROVIDER_SITE_OTHER): Payer: Self-pay | Admitting: Psychiatry

## 2016-11-15 VITALS — BP 132/74 | HR 59 | Ht 71.0 in | Wt 213.0 lb

## 2016-11-15 DIAGNOSIS — F410 Panic disorder [episodic paroxysmal anxiety] without agoraphobia: Secondary | ICD-10-CM

## 2016-11-15 DIAGNOSIS — F411 Generalized anxiety disorder: Secondary | ICD-10-CM

## 2016-11-15 DIAGNOSIS — Z79899 Other long term (current) drug therapy: Secondary | ICD-10-CM

## 2016-11-15 DIAGNOSIS — Z818 Family history of other mental and behavioral disorders: Secondary | ICD-10-CM

## 2016-11-15 DIAGNOSIS — Z7984 Long term (current) use of oral hypoglycemic drugs: Secondary | ICD-10-CM

## 2016-11-15 DIAGNOSIS — F332 Major depressive disorder, recurrent severe without psychotic features: Secondary | ICD-10-CM

## 2016-11-15 DIAGNOSIS — Z7951 Long term (current) use of inhaled steroids: Secondary | ICD-10-CM

## 2016-11-15 DIAGNOSIS — F17210 Nicotine dependence, cigarettes, uncomplicated: Secondary | ICD-10-CM

## 2016-11-15 DIAGNOSIS — F4001 Agoraphobia with panic disorder: Secondary | ICD-10-CM

## 2016-11-15 MED ORDER — ALPRAZOLAM 0.5 MG PO TABS: ORAL_TABLET | ORAL | 2 refills | Status: DC

## 2016-11-15 NOTE — Progress Notes (Signed)
Patient ID: Richard [name], male   DOB: September 05, 1965, 51 y.o.   MRN: [id number] Patient ID: Richard [name], male   DOB: July 10, 1966, 51 y.o.   MRN: [id number] Patient ID: Richard [name], male   DOB: 1965-09-30, 51 y.o.   MRN: [id number] Patient ID: Richard [name], male   DOB: 1965-07-18, 51 y.o.   MRN: [id number] Patient ID: Richard [name], male   DOB: 21-Mar-1966, 51 y.o.   MRN: [id number] Patient ID: Richard [name], male   DOB: Dec 27, 1965, 51 y.o.   MRN: [id number] Patient ID: Richard [name], male   DOB: 04-30-1966, 51 y.o.   MRN: [id number] Patient ID: Richard [name], male   DOB: 08-27-1965, 51 y.o.   MRN: [id number] Patient ID: Richard [name], male   DOB: 1965-11-10, 51 y.o.   MRN: [id number] Patient ID: Richard [name], male   DOB: 01-09-1966, 51 y.o.   MRN: [id number] Patient ID: Richard [name], male   DOB: 1966/01/15, 51 y.o.   MRN: [id number] Patient ID: Richard [name], male   DOB: 10-Dec-1965, 51 y.o.   MRN: [id number] Patient ID: Richard [name], male   DOB: 1965-11-26, 51 y.o.   MRN: [id number] Patient ID: Richard [name], male   DOB: 02/09/1966, 51 y.o.   MRN: [id number] Patient ID: Richard [name], male   DOB: 28-Jul-1965, 51 y.o.   MRN: [id number] Patient ID: Richard [name], male   DOB: February 02, 1966, 51 y.o.   MRN: [id number] [facility] [id number] Progress Note Richard [name] MRN: [id number] DOB: Feb 10, 1966 Age: 51 y.o.  Date: 11/15/2016  Chief Complaint  Patient presents with  . Anxiety  . Follow-up   History of Chief Complaint:   This 51 year old Caucasian male who came for his followup appointment. He is currently living with his wife in [place]. He is unemployed and applying for disability.  The patient states that he has been employed for 5 years. 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 He got injured on the job and both shoulders were torn. He has not been able to work ever since and he feels very badly about this. 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  Since he's not been able to work the patient has been having increasingly depressed and anxious. He feels like his body gives out when he is trying to exert himself even in a minor way. He's been in the ER recently for chest pain which was ruled out as a panic attack. He also saw cardiologist this week and will be having a stress test. His blood pressure was too low and his lisinopril was cut down. He is also trying to quit smoking by using electronic cigarettes.  The patient returns after 3 months. He states that he is doing okay but still has lots of problems with heart palpitations. He had told the cardiologist that the [facility] and I'd was helping but today's states that he doesn't think it really is. He states whenever he lies down he has atrial fibrillation but when he takes 5 Tums it eases  Off. He is trying to go lighter on the caffeine. He still feels anxious a good deal and states the Xanax helps but he only takes it when he is not driving or using equipment. He does not want to try any further antidepressants because they "made me see things." He does though have  chronic pain in his back and legs and hips but he "wants to keep going until I hit the ground." Anxiety  Presents for follow-up visit. Symptoms include nervous/anxious behavior.    Depression         Associated symptoms include fatigue.  Past medical history includes anxiety.    Review of Systems  Constitutional: Positive for fatigue.  Gastrointestinal: Negative.   Musculoskeletal: Positive for back pain.  Skin: Negative.   Psychiatric/Behavioral: Positive for depression, dysphoric mood and sleep disturbance. The patient is nervous/anxious.     Physical Exam Vitals: BP 132/74 (BP Location: Right Arm, Patient Position: Sitting, Cuff Size: Large)   Pulse (!) 59   Ht 5\' 11"  (1.803 Richard [name])   Wt 213 lb (96.6 kg)   BMI 29.71 kg/Richard [name]    Past Psychiatric History: Diagnosis: none  Hospitalizations: none   Outpatient Care: none  Substance Abuse Care: none  Self-Mutilation: none  Suicidal Attempts: none  Violent Behaviors: none   Allergies: Allergies  Allergen Reactions  . Dexilant [Dexlansoprazole] Anaphylaxis  . Mushroom Ext Cmplx(Shiitake-Reishi-Mait) Anaphylaxis    Rapid heart rate.  . Penicillins Anaphylaxis    Has patient had a PCN reaction causing immediate rash, facial/tongue/throat swelling, SOB or lightheadedness with hypotension: Yes Has patient had a PCN reaction causing severe rash involving mucus membranes or skin necrosis: No Has patient had a PCN reaction that required hospitalization Yes Has patient had a PCN reaction occurring within the last 10 years: No If all of the above answers are "NO", then may proceed with Cephalosporin use.   [name] Doxycycline    Medical History: Past Medical History:  Diagnosis Date  . Arthritis    "legs, spine, shoulders" (08/12/2015)  . Asthma   . Chest pain    + palpitations; cath 2005- 30-40% mid LAD, 20% D1, 20% cx, OM, 20-30% RCA, and EF-55%  . Colitis 1990  . COPD (chronic obstructive pulmonary disease) ([place])   . Depression   . Gastric ulcer 2003; 2012   2003: + esophagitis; negative H.pylori serology  2012: Dr. Oneida Alar, mild gastritis, Bravo PH probe placement, negative H.pylori  . GERD (gastroesophageal reflux disease)   . Hepatic steatosis   . History of hiatal hernia   . Hyperlipemia   . Hypertension   . Overweight   . Panic attacks   . Paroxysmal atrial fibrillation (HCC)   . TIA (transient ischemic attack)    after cath 1/ 17  . Tobacco abuse    1/2 pack per day  . Type II diabetes mellitus ([place])    Surgical History: Past Surgical History:  Procedure Laterality Date  . BRAVO [place] STUDY  05/03/2011   DDU:KGUR gastritis/normal esophagus and duodenum  . CARDIAC CATHETERIZATION  1990s X 1; 2005; 08/12/2015  . CARDIAC CATHETERIZATION N/A 08/12/2015   Procedure: Left Heart Cath and Coronary Angiography;  Surgeon: Belva Crome, MD; LAD 20%, CFX 65%, RCA 20%, EF 60%   . COLONOSCOPY  1990  . ESOPHAGOGASTRODUODENOSCOPY  05/03/2011   [facility]:CWCB gastritis  . NECK MASS EXCISION Right    "done in dr's office; behind right ear/side of ncek"  . SHOULDER ARTHROSCOPY W/ ROTATOR CUFF REPAIR Right 2006   acromioclavicular joint arthrosis   Family History: family history includes ADD / ADHD in his daughter; Alcohol abuse in his father and mother; Anxiety disorder in his sister and sister; Dementia in his cousin and paternal uncle; Depression in his sister; Diabetes in his brother and father; Heart attack (age of onset: 81)  in his brother; Heart attack (age of onset: 29) in his father; Hypertension in his brother, brother, and brother; Lung cancer in his mother; Seizures in his brother. Reviewed again today and nothing new.   Current Medications:  Current Outpatient Prescriptions  Medication Sig Dispense Refill  . acetaminophen (TYLENOL) 325 MG tablet Take 650 mg by mouth every 6 (six) hours as needed for pain or fever.     [name] albuterol (VENTOLIN HFA) 108 (90 BASE) MCG/ACT inhaler Inhale 2 puffs into the lungs every 6 (six) hours as needed for wheezing or shortness of breath.     . ALPRAZolam (XANAX) 0.5 MG tablet Take one tablet by mouth three times a day and two tablets at night 150 tablet 2  . amLODipine (NORVASC) 5 MG tablet Take 1 tablet (5 mg total) by mouth daily. 30 tablet 3  . budesonide-formoterol (SYMBICORT) 80-4.5 MCG/ACT inhaler Inhale 2 puffs into the lungs 2 (two) times daily as needed.     . calcium carbonate (TUMS - DOSED IN MG ELEMENTAL CALCIUM) 500 MG chewable tablet Chew 1 tablet by mouth daily as needed for indigestion or heartburn.    . cyclobenzaprine (FLEXERIL) 10 MG tablet Take 1 tablet (10 mg total) by mouth 3 (three) times daily. (Patient taking differently: Take 10 mg by mouth 3 (three) times daily as needed for muscle spasms. ) 21 tablet 0  . esomeprazole (NEXIUM) 40 MG capsule Take 1 capsule (40 mg  total) by mouth 2 (two) times daily before a meal. (Patient taking differently: Take 40 mg by mouth daily. ) 180 capsule 3  . flecainide (TAMBOCOR) 100 MG tablet Take 1 tablet (100 mg total) by mouth 2 (two) times daily. 60 tablet 3  . HYDROcodone-acetaminophen (NORCO/VICODIN) 5-325 MG tablet Take 1 tablet by mouth every 4 (four) hours as needed (pain).    [name] lovastatin (MEVACOR) 20 MG tablet TAKE ONE TABLET BY MOUTH ONCE DAILY AT 6:OO PM 30 tablet 11  . metFORMIN (GLUCOPHAGE) 500 MG tablet Take 1 tablet (500 mg total) by mouth 2 (two) times daily with a meal. 30 tablet 2  . metoprolol succinate (TOPROL-XL) 50 MG 24 hr tablet Take 75 mg by mouth daily. Take with or immediately following a meal.    . nitroGLYCERIN (NITROSTAT) 0.4 MG SL tablet Place 1 tablet (0.4 mg total) under the tongue every 5 (five) minutes as needed for chest pain. 25 tablet 3  . rivaroxaban (XARELTO) 20 MG TABS tablet Take 1 tablet (20 mg total) by mouth daily with supper. 30 tablet 3   No current facility-administered medications for this visit.     Previous Psychotropic Medications: Medication Dose   Xanax     Substance Abuse History in the last 12 months: Substance Age of 1st Use Last Use Amount Specific Type  Nicotine  17  2 hours ago  1  cigarette  Alcohol  22  38      Cannabis  none        Opiates  36  today  2.5 mg  hyrdocodone  Cocaine  none        Methamphetamines  none        LSD  none        Ecstasy  none         Benzodiazepines  22  started in early thrities  0.5 mg  Xanax  Caffeine  childhood  this AM  1 cup  Mt Dew  Inhalants  none  Others:      Sugar  11  this AM  20 tsps  in [facility] Consequences of Substance Abuse: pain Legal Consequences of Substance Abuse: none Family Consequences of Substance Abuse: none Blackouts:  No DT's:  No Withdrawal Symptoms:  Yes Tremors  Social History: Patient lives with his life, daughter and her husband with 2 grandbabies.  He has 2 sons and  the daughter.  He is a Programmer, systems.    Mental Status Examination/Evaluation: Objective:  Appearance: Casual  Eye Contact::  Good  Speech:  Clear and Coherent  Volume:  Normal  Mood:  anxious But fairly bright   Affect:  Congruent  Thought Process:  Coherent, Intact and Linear  Orientation:  Full (Time, Place, and Person)  Thought Content:  WDL  Suicidal Thoughts:  No  Homicidal Thoughts:  No  Judgement:  Good  Insight:  Fair  Psychomotor Activity:  Normal  Akathisia:  No  Handed:  Right  AIMS (if indicated):    Assets:  Communication Skills Desire for Improvement   Lab Results:  Results for orders placed or performed during the hospital encounter of 07/28/16 (from the past 8736 hour(s))  Basic metabolic panel   Collection Time: 07/28/16 12:15 PM  Result Value Ref Range   Sodium 137 135 - 145 mmol/L   Potassium 4.0 3.5 - 5.1 mmol/L   Chloride 103 101 - 111 mmol/L   CO2 25 22 - 32 mmol/L   Glucose, Bld 244 (H) 65 - 99 mg/dL   BUN 11 6 - 20 mg/dL   Creatinine, Ser 1.05 0.61 - 1.24 mg/dL   Calcium 9.2 8.9 - 10.3 mg/dL   GFR calc non Af Amer >60 >60 mL/min   GFR calc Af Amer >60 >60 mL/min   Anion gap 9 5 - 15  Magnesium   Collection Time: 07/28/16 12:15 PM  Result Value Ref Range   Magnesium 2.0 1.7 - 2.4 mg/dL  Results for orders placed or performed during the hospital encounter of 04/18/16 (from the past 8736 hour(s))  CBC   Collection Time: 04/18/16 10:44 AM  Result Value Ref Range   WBC 6.4 4.0 - 10.5 K/uL   RBC 5.17 4.22 - 5.81 MIL/uL   Hemoglobin 16.2 13.0 - 17.0 g/dL   HCT 46.9 39.0 - 52.0 %   MCV 90.7 78.0 - 100.0 fL   MCH 31.3 26.0 - 34.0 pg   MCHC 34.5 30.0 - 36.0 g/dL   RDW 12.7 11.5 - 15.5 %   Platelets 190 150 - 400 K/uL  Basic metabolic panel   Collection Time: 04/18/16 10:44 AM  Result Value Ref Range   Sodium 139 135 - 145 mmol/L   Potassium 3.6 3.5 - 5.1 mmol/L   Chloride 108 101 - 111 mmol/L   CO2 25 22 - 32 mmol/L   Glucose, Bld  236 (H) 65 - 99 mg/dL   BUN 12 6 - 20 mg/dL   Creatinine, Ser 0.90 0.61 - 1.24 mg/dL   Calcium 9.1 8.9 - 10.3 mg/dL   GFR calc non Af Amer >60 >60 mL/min   GFR calc Af Amer >60 >60 mL/min   Anion gap 6 5 - 15  Results for orders placed or performed during the hospital encounter of 01/20/16 (from the past 8736 hour(s))  CBC with Differential/Platelet   Collection Time: 01/20/16 10:26 AM  Result Value Ref Range   WBC 7.6 4.0 - 10.5 K/uL   RBC 5.05 4.22 -  5.81 MIL/uL   Hemoglobin 16.0 13.0 - 17.0 g/dL   HCT 45.5 39.0 - 52.0 %   MCV 90.1 78.0 - 100.0 fL   MCH 31.7 26.0 - 34.0 pg   MCHC 35.2 30.0 - 36.0 g/dL   RDW 13.1 11.5 - 15.5 %   Platelets 189 150 - 400 K/uL   Neutrophils Relative % 63 %   Neutro Abs 4.8 1.7 - 7.7 K/uL   Lymphocytes Relative 27 %   Lymphs Abs 2.1 0.7 - 4.0 K/uL   Monocytes Relative 8 %   Monocytes Absolute 0.6 0.1 - 1.0 K/uL   Eosinophils Relative 2 %   Eosinophils Absolute 0.2 0.0 - 0.7 K/uL   Basophils Relative 0 %   Basophils Absolute 0.0 0.0 - 0.1 K/uL  Results for orders placed or performed during the hospital encounter of 01/14/16 (from the past 8736 hour(s))  Basic metabolic panel   Collection Time: 01/14/16  2:31 PM  Result Value Ref Range   Sodium 136 135 - 145 mmol/L   Potassium 3.8 3.5 - 5.1 mmol/L   Chloride 105 101 - 111 mmol/L   CO2 25 22 - 32 mmol/L   Glucose, Bld 128 (H) 65 - 99 mg/dL   BUN 16 6 - 20 mg/dL   Creatinine, Ser 0.94 0.61 - 1.24 mg/dL   Calcium 8.7 (L) 8.9 - 10.3 mg/dL   GFR calc non Af Amer >60 >60 mL/min   GFR calc Af Amer >60 >60 mL/min   Anion gap 6 5 - 15  Magnesium   Collection Time: 01/14/16  2:31 PM  Result Value Ref Range   Magnesium 1.9 1.7 - 2.4 mg/dL    Assessment:   AXIS I Generalized Anxiety Disorder, Major Depression, Recurrent severe and Panic Disorder  AXIS II Deferred  AXIS III Past Medical History:  Diagnosis Date  . Arthritis    "legs, spine, shoulders" (08/12/2015)  . Asthma   . Chest pain     + palpitations; cath 2005- 30-40% mid LAD, 20% D1, 20% cx, OM, 20-30% RCA, and EF-55%  . Colitis 1990  . COPD (chronic obstructive pulmonary disease) ([place])   . Depression   . Gastric ulcer 2003; 2012   2003: + esophagitis; negative H.pylori serology  2012: Dr. Oneida Alar, mild gastritis, Bravo PH probe placement, negative H.pylori  . GERD (gastroesophageal reflux disease)   . Hepatic steatosis   . History of hiatal hernia   . Hyperlipemia   . Hypertension   . Overweight   . Panic attacks   . Paroxysmal atrial fibrillation (HCC)   . TIA (transient ischemic attack)    after cath 1/ 17  . Tobacco abuse    1/2 pack per day  . Type II diabetes mellitus (HCC)      AXIS IV other psychosocial or environmental problems  AXIS V 41-50 serious symptoms   Treatment Plan/Recommendations: Laboratory:    Psychotherapy:    Medications:  He will continue  Xanax 0.5 mg 3 times a day and 1 mg at bedtime.   Routine PRN Medications:  No  Consultations:    Safety Concerns:  none  Other:  Return in 3 months      MEDICATIONS this encounter: Meds ordered this encounter  Medications  . ALPRAZolam (XANAX) 0.5 MG tablet    Sig: Take one tablet by mouth three times a day and two tablets at night    Dispense:  150 tablet    Refill:  2   Medical Decision  Making Problem Points:  Established problem, stable/improving (1), Review of last therapy session (1) and Review of psycho-social stressors (1) Data Points:  Review or order clinical lab tests (1) Review of medication regiment & side effects (2) Review of new medications or change in dosage (2)  I certify that outpatient services furnished can reasonably be expected to improve the patient's condition.   [name], MD Patient ID: MAUREEN DUESING, male   DOB: [birth year], 51 y.o.   MRN: [id number]

## 2016-11-16 ENCOUNTER — Encounter (HOSPITAL_COMMUNITY)
Admission: RE | Admit: 2016-11-16 | Discharge: 2016-11-16 | Disposition: A | Discharge status: Home / Self Care | Payer: Self-pay | Source: Ambulatory Visit | Attending: Gastroenterology | Admitting: Gastroenterology

## 2016-11-16 ENCOUNTER — Other Ambulatory Visit (HOSPITAL_COMMUNITY): Payer: Self-pay

## 2016-11-16 ENCOUNTER — Encounter (HOSPITAL_COMMUNITY): Payer: Self-pay

## 2016-11-16 DIAGNOSIS — Z01812 Encounter for preprocedural laboratory examination: Secondary | ICD-10-CM | POA: Insufficient documentation

## 2016-11-16 HISTORY — DX: Cardiac arrhythmia, unspecified: I49.9

## 2016-11-16 HISTORY — DX: Cerebral infarction, unspecified: I63.9

## 2016-11-16 LAB — CBC WITH DIFFERENTIAL/PLATELET
BASOS PCT: 0 %
Basophils Absolute: 0 10*3/uL (ref 0.0–0.1)
Eosinophils Absolute: 0.2 10*3/uL (ref 0.0–0.7)
Eosinophils Relative: 3 %
HCT: 45 % (ref 39.0–52.0)
Hemoglobin: 15.9 g/dL (ref 13.0–17.0)
LYMPHS PCT: 41 %
Lymphs Abs: 2.3 10*3/uL (ref 0.7–4.0)
MCH: 31.5 pg (ref 26.0–34.0)
MCHC: 35.3 g/dL (ref 30.0–36.0)
MCV: 89.3 fL (ref 78.0–100.0)
MONO ABS: 0.3 10*3/uL (ref 0.1–1.0)
MONOS PCT: 6 %
NEUTROS ABS: 2.7 10*3/uL (ref 1.7–7.7)
Neutrophils Relative %: 50 %
Platelets: 185 10*3/uL (ref 150–400)
RBC: 5.04 MIL/uL (ref 4.22–5.81)
RDW: 12.6 % (ref 11.5–15.5)
WBC: 5.5 10*3/uL (ref 4.0–10.5)

## 2016-11-16 LAB — BASIC METABOLIC PANEL
Anion gap: 8 (ref 5–15)
BUN: 14 mg/dL (ref 6–20)
CALCIUM: 8.9 mg/dL (ref 8.9–10.3)
CO2: 23 mmol/L (ref 22–32)
CREATININE: 0.92 mg/dL (ref 0.61–1.24)
Chloride: 105 mmol/L (ref 101–111)
GFR calc Af Amer: >60 mL/min (ref >60)
GFR calc non Af Amer: >60 mL/min (ref >60)
GLUCOSE: 218 mg/dL — AB (ref 65–99)
Potassium: 3.7 mmol/L (ref 3.5–5.1)
Sodium: 136 mmol/L (ref 135–145)

## 2016-11-16 NOTE — Progress Notes (Signed)
   11/16/16 1502  OBSTRUCTIVE SLEEP APNEA  Have you ever been diagnosed with sleep apnea through a sleep study? No  Do you snore loudly (loud enough to be heard through closed doors)?  1  Do you often feel tired, fatigued, or sleepy during the daytime (such as falling asleep during driving or talking to someone)? 1  Has anyone observed you stop breathing during your sleep? 1  Do you have, or are you being treated for high blood pressure? 1  BMI more than 35 kg/m2? 0  Age > 50 (1-yes) 1  Neck circumference greater than:Male 16 inches or larger, Male 17inches or larger? 0  Male Gender (Yes=1) 1  Obstructive Sleep Apnea Score 6  Score 5 or greater  Results sent to PCP

## 2016-11-21 ENCOUNTER — Ambulatory Visit (HOSPITAL_COMMUNITY): Payer: Self-pay | Admitting: Anesthesiology

## 2016-11-21 ENCOUNTER — Ambulatory Visit (HOSPITAL_COMMUNITY)
Admission: RE | Admit: 2016-11-21 | Discharge: 2016-11-21 | Disposition: A | Discharge status: Home / Self Care | Payer: Self-pay | Source: Ambulatory Visit | Attending: Gastroenterology | Admitting: Gastroenterology

## 2016-11-21 ENCOUNTER — Encounter (HOSPITAL_COMMUNITY)
Admission: RE | Disposition: A | Discharge status: Home / Self Care | Payer: Self-pay | Source: Ambulatory Visit | Attending: Gastroenterology

## 2016-11-21 ENCOUNTER — Encounter (HOSPITAL_COMMUNITY): Payer: Self-pay | Admitting: *Deleted

## 2016-11-21 DIAGNOSIS — K635 Polyp of colon: Secondary | ICD-10-CM

## 2016-11-21 DIAGNOSIS — E119 Type 2 diabetes mellitus without complications: Secondary | ICD-10-CM | POA: Insufficient documentation

## 2016-11-21 DIAGNOSIS — Z7984 Long term (current) use of oral hypoglycemic drugs: Secondary | ICD-10-CM | POA: Insufficient documentation

## 2016-11-21 DIAGNOSIS — F17210 Nicotine dependence, cigarettes, uncomplicated: Secondary | ICD-10-CM | POA: Insufficient documentation

## 2016-11-21 DIAGNOSIS — I480 Paroxysmal atrial fibrillation: Secondary | ICD-10-CM | POA: Insufficient documentation

## 2016-11-21 DIAGNOSIS — F329 Major depressive disorder, single episode, unspecified: Secondary | ICD-10-CM | POA: Insufficient documentation

## 2016-11-21 DIAGNOSIS — K648 Other hemorrhoids: Secondary | ICD-10-CM | POA: Insufficient documentation

## 2016-11-21 DIAGNOSIS — Z79899 Other long term (current) drug therapy: Secondary | ICD-10-CM | POA: Insufficient documentation

## 2016-11-21 DIAGNOSIS — K644 Residual hemorrhoidal skin tags: Secondary | ICD-10-CM | POA: Insufficient documentation

## 2016-11-21 DIAGNOSIS — E785 Hyperlipidemia, unspecified: Secondary | ICD-10-CM | POA: Insufficient documentation

## 2016-11-21 DIAGNOSIS — M1990 Unspecified osteoarthritis, unspecified site: Secondary | ICD-10-CM | POA: Insufficient documentation

## 2016-11-21 DIAGNOSIS — D124 Benign neoplasm of descending colon: Secondary | ICD-10-CM | POA: Insufficient documentation

## 2016-11-21 DIAGNOSIS — Z1211 Encounter for screening for malignant neoplasm of colon: Secondary | ICD-10-CM

## 2016-11-21 DIAGNOSIS — J449 Chronic obstructive pulmonary disease, unspecified: Secondary | ICD-10-CM | POA: Insufficient documentation

## 2016-11-21 DIAGNOSIS — Z8719 Personal history of other diseases of the digestive system: Secondary | ICD-10-CM | POA: Insufficient documentation

## 2016-11-21 DIAGNOSIS — I2510 Atherosclerotic heart disease of native coronary artery without angina pectoris: Secondary | ICD-10-CM | POA: Insufficient documentation

## 2016-11-21 DIAGNOSIS — Z1212 Encounter for screening for malignant neoplasm of rectum: Secondary | ICD-10-CM

## 2016-11-21 DIAGNOSIS — I10 Essential (primary) hypertension: Secondary | ICD-10-CM | POA: Insufficient documentation

## 2016-11-21 DIAGNOSIS — Z8673 Personal history of transient ischemic attack (TIA), and cerebral infarction without residual deficits: Secondary | ICD-10-CM | POA: Insufficient documentation

## 2016-11-21 DIAGNOSIS — K219 Gastro-esophageal reflux disease without esophagitis: Secondary | ICD-10-CM | POA: Insufficient documentation

## 2016-11-21 HISTORY — PX: COLONOSCOPY WITH PROPOFOL: SHX5780

## 2016-11-21 HISTORY — PX: POLYPECTOMY: SHX5525

## 2016-11-21 LAB — GLUCOSE, CAPILLARY
GLUCOSE-CAPILLARY: 174 mg/dL — AB (ref 65–99)
Glucose-Capillary: 155 mg/dL — ABNORMAL HIGH (ref 65–99)

## 2016-11-21 SURGERY — COLONOSCOPY WITH PROPOFOL
Anesthesia: Monitor Anesthesia Care

## 2016-11-21 MED ORDER — PROPOFOL 500 MG/50ML IV EMUL
INTRAVENOUS | Status: DC | PRN
  Administered 2016-11-21: 125 ug/kg/min via INTRAVENOUS

## 2016-11-21 MED ORDER — PROPOFOL 10 MG/ML IV BOLUS
INTRAVENOUS | Status: AC
  Filled 2016-11-21: qty 40

## 2016-11-21 MED ORDER — FENTANYL CITRATE (PF) 100 MCG/2ML IJ SOLN
25.0000 ug | INTRAMUSCULAR | Status: AC
Start: 2016-11-21 — End: 2016-11-21
  Administered 2016-11-21 (×2): 25 ug via INTRAVENOUS

## 2016-11-21 MED ORDER — LACTATED RINGERS IV SOLN
INTRAVENOUS | Status: DC
  Administered 2016-11-21: 1000 mL via INTRAVENOUS

## 2016-11-21 MED ORDER — CHLORHEXIDINE GLUCONATE CLOTH 2 % EX PADS: 6.0000 | MEDICATED_PAD | Freq: Once | CUTANEOUS | Status: DC

## 2016-11-21 MED ORDER — FENTANYL CITRATE (PF) 100 MCG/2ML IJ SOLN
INTRAMUSCULAR | Status: AC
  Filled 2016-11-21: qty 2

## 2016-11-21 MED ORDER — HYDROCORTISONE ACE-PRAMOXINE 1-1 % RE CREA: TOPICAL_CREAM | RECTAL | 0 refills | Status: DC

## 2016-11-21 MED ORDER — MIDAZOLAM HCL 2 MG/2ML IJ SOLN
INTRAMUSCULAR | Status: AC
  Filled 2016-11-21: qty 2

## 2016-11-21 MED ORDER — LIDOCAINE HCL (PF) 1 % IJ SOLN
INTRAMUSCULAR | Status: AC
  Filled 2016-11-21: qty 5

## 2016-11-21 MED ORDER — MIDAZOLAM HCL 2 MG/2ML IJ SOLN
1.0000 mg | INTRAMUSCULAR | Status: AC
Start: 2016-11-21 — End: 2016-11-21
  Administered 2016-11-21: 2 mg via INTRAVENOUS

## 2016-11-21 NOTE — Anesthesia Postprocedure Evaluation (Signed)
Anesthesia Post Note  Patient: Richard [name]  Procedure(s) Performed: Procedure(s) (LRB): COLONOSCOPY WITH PROPOFOL (N/A) POLYPECTOMY  Patient location during evaluation: PACU Anesthesia Type: MAC Level of consciousness: awake and alert and oriented Pain management: pain level controlled Vital Signs Assessment: post-procedure vital signs reviewed and stable Respiratory status: spontaneous breathing Cardiovascular status: blood pressure returned to baseline Postop Assessment: no signs of nausea or vomiting Anesthetic complications: no     Last Vitals:  Vitals:   11/21/16 0720 11/21/16 0757  BP: 138/78 (!) 100/56  Pulse: 61 (!) 54  Resp:  16  Temp:  36.5 C    Last Pain:  Vitals:   11/21/16 0641  TempSrc: Oral                 Zissel Biederman

## 2016-11-21 NOTE — H&P (Addendum)
Primary Care Physician:  Raiford Simmonds., PA-C Primary Gastroenterologist:  Dr. Oneida Alar  Pre-Procedure History & Physical: HPI:  Richard [name] is a 51 y.o. male here for COLON CANCER SCREENING. STOPPED XARELTO MAY 6.  Past Medical History:  Diagnosis Date  . Arthritis    "legs, spine, shoulders" (08/12/2015)  . Asthma   . Chest pain    + palpitations; cath 2005- 30-40% mid LAD, 20% D1, 20% cx, OM, 20-30% RCA, and EF-55%  . Colitis 1990  . COPD (chronic obstructive pulmonary disease) ([place])   . Depression   . Dysrhythmia    AFib  . Gastric ulcer 2003; 2012   2003: + esophagitis; negative H.pylori serology  2012: Dr. Oneida Alar, mild gastritis, Bravo PH probe placement, negative H.pylori  . GERD (gastroesophageal reflux disease)   . Hepatic steatosis   . History of hiatal hernia   . Hyperlipemia   . Hypertension   . Overweight   . Panic attacks   . Paroxysmal atrial fibrillation (HCC)   . Stroke [facility])    mucsle in left side of face dosent work as its supposed to-only deficit.  [name] TIA (transient ischemic attack)    after cath 1/ 17  . Tobacco abuse    1/2 pack per day  . Type II diabetes mellitus ([place])     Past Surgical History:  Procedure Laterality Date  . BRAVO [place] STUDY  05/03/2011   VOZ:DGUY gastritis/normal esophagus and duodenum  . CARDIAC CATHETERIZATION  1990s X 1; 2005; 08/12/2015  . CARDIAC CATHETERIZATION N/A 08/12/2015   Procedure: Left Heart Cath and Coronary Angiography;  Surgeon: Belva Crome, MD; LAD 20%, CFX 65%, RCA 20%, EF 60%   . COLONOSCOPY  1990  . ESOPHAGOGASTRODUODENOSCOPY  05/03/2011   QIH:KVQQ gastritis  . NECK MASS EXCISION Right    "done in dr's office; behind right ear/side of ncek"  . SHOULDER ARTHROSCOPY W/ ROTATOR CUFF REPAIR Right 2006   acromioclavicular joint arthrosis    Prior to Admission medications   Medication Sig Start Date End Date Taking? Authorizing Provider  acetaminophen (TYLENOL) 325 MG tablet Take 650 mg by mouth every 6  (six) hours as needed for pain or fever.    Yes [provider]  albuterol (VENTOLIN HFA) 108 (90 BASE) MCG/ACT inhaler Inhale 2 puffs into the lungs every 6 (six) hours as needed for wheezing or shortness of breath.    Yes [provider]  ALPRAZolam Duanne Moron) 0.5 MG tablet Take one tablet by mouth three times a day and two tablets at night 11/15/16  Yes Cloria Spring, MD  amLODipine (NORVASC) 5 MG tablet Take 1 tablet (5 mg total) by mouth daily. 01/31/16  Yes Lendon Colonel, NP  budesonide-formoterol [facility]) 80-4.5 MCG/ACT inhaler Inhale 2 puffs into the lungs 2 (two) times daily as needed.    Yes [provider]  calcium carbonate (TUMS - DOSED IN MG ELEMENTAL CALCIUM) 500 MG chewable tablet Chew 1 tablet by mouth daily as needed for indigestion or heartburn.   Yes [provider]  cyclobenzaprine (FLEXERIL) 10 MG tablet Take 1 tablet (10 mg total) by mouth 3 (three) times daily. Patient taking differently: Take 10 mg by mouth 3 (three) times daily as needed for muscle spasms.  10/07/15  Yes Lily Kocher, PA-C  esomeprazole (NEXIUM) 40 MG capsule Take 1 capsule (40 mg total) by mouth 2 (two) times daily before a meal. Patient taking differently: Take 40 mg by mouth daily.  11/13/13  Yes  Annitta Needs, NP  flecainide (TAMBOCOR) 100 MG tablet Take 1 tablet (100 mg total) by mouth 2 (two) times daily. 08/30/16  Yes Sherran Needs, NP  HYDROcodone-acetaminophen (NORCO/VICODIN) 5-325 MG tablet Take 1 tablet by mouth every 4 (four) hours as needed (pain).   Yes [provider]  lovastatin (MEVACOR) 20 MG tablet TAKE ONE TABLET BY MOUTH ONCE DAILY AT 6:OO PM 10/22/15  Yes Herminio Commons, MD  metFORMIN (GLUCOPHAGE) 500 MG tablet Take 1 tablet (500 mg total) by mouth 2 (two) times daily with a meal. 04/28/13  Yes Jegede, Olugbemiga E, MD  metoprolol succinate (TOPROL-XL) 50 MG 24 hr tablet Take 75 mg by mouth daily. Take with or immediately following a  meal.   Yes [provider]  nitroGLYCERIN (NITROSTAT) 0.4 MG SL tablet Place 1 tablet (0.4 mg total) under the tongue every 5 (five) minutes as needed for chest pain. 06/22/14  Yes Lendon Colonel, NP  rivaroxaban (XARELTO) 20 MG TABS tablet Take 1 tablet (20 mg total) by mouth daily with supper. 01/20/16  Yes Herminio Commons, MD    Allergies as of 10/27/2016 - Review Complete 10/25/2016  Allergen Reaction Noted  . Dexilant [dexlansoprazole] Anaphylaxis 01/20/2015  . Mushroom ext cmplx(shiitake-reishi-mait) Anaphylaxis 03/22/2011  . Penicillins Anaphylaxis   . Doxycycline  04/18/2016    Family History  Problem Relation Age of Onset  . Lung cancer Mother   . Alcohol abuse Mother   . Heart attack Father 34  . Diabetes Father   . Alcohol abuse Father   . Hypertension Brother   . Hypertension Brother   . Anxiety disorder Sister   . Depression Sister   . Anxiety disorder Sister   . Heart attack Brother 25  . Diabetes Brother   . Hypertension Brother   . Seizures Brother   . Dementia Paternal Uncle   . Dementia Cousin   . ADD / ADHD Daughter   . Colon cancer Neg Hx   . Drug abuse Neg Hx   . Bipolar disorder Neg Hx   . OCD Neg Hx   . Paranoid behavior Neg Hx   . Schizophrenia Neg Hx   . Sexual abuse Neg Hx   . Physical abuse Neg Hx     Social History   Social History  . Marital status: Married    Spouse name: N/A  . Number of children: N/A  . Years of education: N/A   Occupational History  . full time Unemployed   Social History Main Topics  . Smoking status: Current Every Day Smoker    Packs/day: 0.50    Years: 25.00    Types: Cigarettes    Start date: 07/17/1982  . Smokeless tobacco: Never Used  . Alcohol use No  . Drug use: No  . Sexual activity: Yes    Birth control/ protection: None   Other Topics Concern  . Not on file   Social History Narrative   Pt lives in [place] with wife.  5 children.  Unemployed due to panic attacks and  back pain    Review of Systems: See HPI, otherwise negative ROS   Physical Exam: BP 126/80   Pulse (!) 58   Temp 98 F (36.7 C) (Oral)   Resp (!) 3   Ht 5\' 11"  (1.803 m)   Wt 213 lb (96.6 kg)   SpO2 95%   BMI 29.71 kg/m  General:   Alert,  pleasant and cooperative in NAD Head:  Normocephalic and atraumatic. Neck:  Supple; Lungs:  Clear throughout to auscultation.    Heart:  Regular rate and rhythm. Abdomen:  Soft, nontender and nondistended. Normal bowel sounds, without guarding, and without rebound.   Neurologic:  Alert and  oriented x4;  grossly normal neurologically.  Impression/Plan:     SCREENING  Plan:  1. TCS/possible hemorrhoid banding TODAY. DISCUSSED PROCEDURE, BENEFITS, & RISKS: < 1% chance of medication reaction, bleeding, perforation, or rupture of spleen/liver.

## 2016-11-21 NOTE — Transfer of Care (Signed)
Immediate Anesthesia Transfer of Care Note  Patient: Richard [name]  Procedure(s) Performed: Procedure(s) with comments: COLONOSCOPY WITH PROPOFOL (N/A) - 7:30AM POLYPECTOMY - descending colon polyp  Patient Location: PACU  Anesthesia Type:MAC  Level of Consciousness: awake and alert   Airway & Oxygen Therapy: Patient Spontanous Breathing  Post-op Assessment: Report given to RN  Post vital signs: Reviewed and stable  Last Vitals:  Vitals:   11/21/16 0641 11/21/16 0720  BP: 126/80 138/78  Pulse: (!) 58 61  Resp: (!) 3   Temp: 36.7 C     Last Pain:  Vitals:   11/21/16 0641  TempSrc: Oral      Patients Stated Pain Goal: 8 (86/76/19 5093)  Complications: No apparent anesthesia complications

## 2016-11-21 NOTE — Anesthesia Preprocedure Evaluation (Signed)
Anesthesia Evaluation  Patient identified by MRN, date of birth, ID band Patient awake    Reviewed: Allergy & Precautions, H&P , NPO status , Patient's Chart, lab work & pertinent test results, reviewed documented beta blocker date and time   History of Anesthesia Complications Negative for: history of anesthetic complications  Airway Mallampati: I  TM Distance: >3 FB Neck ROM: Full    Dental  (+) Poor Dentition, Missing, Chipped, Dental Advisory Given   Pulmonary shortness of breath and with exertion, COPD, Current Smoker,    Pulmonary exam normal        Cardiovascular hypertension, Pt. on medications and Pt. on home beta blockers + CAD  + dysrhythmias Atrial Fibrillation  Rhythm:Regular Rate:Normal     Neuro/Psych PSYCHIATRIC DISORDERS Depression TIACVA    GI/Hepatic hiatal hernia, PUD, GERD  Medicated and Poorly Controlled,Fatty Liver    Endo/Other  diabetes, Well Controlled, Type 2, Oral Hypoglycemic Agents  Renal/GU      Musculoskeletal   Abdominal   Peds  Hematology   Anesthesia Other Findings   Reproductive/Obstetrics                             Anesthesia Physical Anesthesia Plan  ASA: III  Anesthesia Plan: MAC   Post-op Pain Management:    Induction: Intravenous  Airway Management Planned: Simple Face Mask  Additional Equipment:   Intra-op Plan:   Post-operative Plan:   Informed Consent: I have reviewed the patients History and Physical, chart, labs and discussed the procedure including the risks, benefits and alternatives for the proposed anesthesia with the patient or authorized representative who has indicated his/her understanding and acceptance.     Plan Discussed with:   Anesthesia Plan Comments:         Anesthesia Quick Evaluation

## 2016-11-21 NOTE — Op Note (Signed)
[facility] Patient Name: Richard [name] Procedure Date: 11/21/2016 7:16 AM MRN: [id number] Date of Birth: [birth year] Attending MD: Barney Drain , MD CSN: [id number] Age: 51 Admit Type: Outpatient Procedure:                Colonoscopy WITH SNARE POLYPECTOMY Indications:              Screening for colorectal malignant neoplasm Providers:                Barney Drain, MD, Aram Candela, Janeece Riggers, RN Referring MD:             Royce Macadamia PA, PA Medicines:                Propofol per Anesthesia Complications:            No immediate complications. Estimated Blood Loss:     Estimated blood loss: none. Procedure:                Pre-Anesthesia Assessment:                           - Prior to the procedure, a History and Physical                            was performed, and patient medications and                            allergies were reviewed. The patient's tolerance of                            previous anesthesia was also reviewed. The risks                            and benefits of the procedure and the sedation                            options and risks were discussed with the patient.                            All questions were answered, and informed consent                            was obtained. Prior Anticoagulants: The patient has                            taken Xarelto (rivaroxaban), last dose was 2 days                            prior to procedure. ASA Grade Assessment: II - A                            patient with mild systemic disease. After reviewing                            the risks and benefits, the patient was deemed in  satisfactory condition to undergo the procedure.                            After obtaining informed consent, the colonoscope                            was passed under direct vision. Throughout the                            procedure, the patient's blood pressure, pulse, and   oxygen saturations were monitored continuously. The                            EC-[id number] ([id number]) scope was introduced through                            the anus and advanced to the 3 cm into the ileum.                            The colonoscopy was performed without difficulty.                            The patient tolerated the procedure well. The                            quality of the bowel preparation was excellent. The                            terminal ileum, ileocecal valve, appendiceal                            orifice, and rectum were photographed. Scope In: 7:39:14 AM Scope Out: 7:52:22 AM Scope Withdrawal Time: 0 hours 11 minutes 32 seconds  Total Procedure Duration: 0 hours 13 minutes 8 seconds  Findings:      The digital rectal exam findings include non-thrombosed external       hemorrhoids.      A 6 mm polyp was found in the proximal descending colon. The polyp was       sessile. The polyp was removed with a hot snare. Resection and retrieval       were complete.      The exam was otherwise without abnormality.      Internal hemorrhoids were found during retroflexion. The hemorrhoids       were moderate.      The terminal ileum appeared normal. Impression:               - Non-thrombosed external hemorrhoids found on                            digital rectal exam.                           - One 6 mm polyp in the proximal descending colon,  removed with a hot snare. Resected and retrieved.                           - The examination was otherwise normal.                           - Internal hemorrhoids.                           - The examined portion of the ileum was normal. Moderate Sedation:      Per Anesthesia Care Recommendation:           - High fiber diet. LOSE TEN POUNDS.                           - Continue present medications. HOLD XARELTO.                            RE-START MAY 9. USE PREPARATION H OR PROCTOZONE QID                             FOR 7 DAYS WHEN NEED TO RELEIVE RECTAL                            PAIN/BLEEDING.                           - Await pathology results.                           - Repeat colonoscopy in 5-10 years for surveillance.                           - Patient has a contact number available for                            emergencies. The signs and symptoms of potential                            delayed complications were discussed with the                            patient. Return to normal activities tomorrow.                            Written discharge instructions were provided to the                            patient. Procedure Code(s):        --- Professional ---                           [phone], Colonoscopy, flexible; with removal of                            tumor(s), polyp(s), or other lesion(s)  by snare                            technique Diagnosis Code(s):        --- Professional ---                           Z12.11, Encounter for screening for malignant                            neoplasm of colon                           D12.4, Benign neoplasm of descending colon                           K64.4, Residual hemorrhoidal skin tags                           K64.8, Other hemorrhoids CPT copyright 2016 [employer]. All rights reserved. The codes documented in this report are preliminary and upon coder review may  be revised to meet current compliance requirements. Barney Drain, MD Barney Drain, MD 11/21/2016 8:15:04 AM This report has been signed electronically. Number of Addenda: 0

## 2016-11-21 NOTE — Discharge Instructions (Signed)
YOUR PREP WAS EXCELLENT. You had 1 polyp removed. You have moderate internal and large external hemorrhoids.    HOLD XARELTO. RESTART MAY 9.   CONTINUE YOUR WEIGHT LOSS EFFORTS. You should LOSE TEN POUNDS. YOU GAINED 5 LBS SINCE 2015.  DRINK WATER TO KEEP YOUR URINE LIGHT YELLOW.  FOLLOW A HIGH FIBER DIET. AVOID ITEMS THAT CAUSE BLOATING & GAS. SEE INFO BELOW.  YOUR BIOPSY RESULTS WILL BE AVAILABLE IN MY CHART AFTER MAY 11 AND MY OFFICE WILL CONTACT YOU IN 10-14 DAYS WITH YOUR RESULTS.   YOU CAN SEE SURGERY TO FIX YOUR HEMORRHOIDS OR USE PREPARATION H OR PROCTOCREAM FOUR TIMES  A DAY FOR 7 DAYS WHEN NEEDED TO RELIEVE RECTAL PAIN/PRESSURE/BLEEDING.  Next colonoscopy in 5-10 years.    Colonoscopy Care After Read the instructions outlined below and refer to this sheet in the next week. These discharge instructions provide you with general information on caring for yourself after you leave the hospital. While your treatment has been planned according to the most current medical practices available, unavoidable complications occasionally occur. If you have any problems or questions after discharge, call DR. FIELDS, [phone].  ACTIVITY  You may resume your regular activity, but move at a slower pace for the next 24 hours.   Take frequent rest periods for the next 24 hours.   Walking will help get rid of the air and reduce the bloated feeling in your belly (abdomen).   No driving for 24 hours (because of the medicine (anesthesia) used during the test).   You may shower.   Do not sign any important legal documents or operate any machinery for 24 hours (because of the anesthesia used during the test).    NUTRITION  Drink plenty of fluids.   You may resume your normal diet as instructed by your doctor.   Begin with a light meal and progress to your normal diet. Heavy or fried foods are harder to digest and may make you feel sick to your stomach (nauseated).   Avoid  alcoholic beverages for 24 hours or as instructed.    MEDICATIONS  You may resume your normal medications.   WHAT YOU CAN EXPECT TODAY  Some feelings of bloating in the abdomen.   Passage of more gas than usual.   Spotting of blood in your stool or on the toilet paper  .  IF YOU HAD POLYPS REMOVED DURING THE COLONOSCOPY:  Eat a soft diet IF YOU HAVE NAUSEA, BLOATING, ABDOMINAL PAIN, OR VOMITING.    FINDING OUT THE RESULTS OF YOUR TEST Not all test results are available during your visit. DR. Oneida Alar WILL CALL YOU WITHIN 14 DAYS OF YOUR PROCEDUE WITH YOUR RESULTS. Do not assume everything is normal if you have not heard from DR. FIELDS, CALL HER OFFICE AT [phone].  SEEK IMMEDIATE MEDICAL ATTENTION AND CALL THE OFFICE: [phone] IF:  You have more than a spotting of blood in your stool.   Your belly is swollen (abdominal distention).   You are nauseated or vomiting.   You have a temperature over 101F.   You have abdominal pain or discomfort that is severe or gets worse throughout the day.   High-Fiber Diet A high-fiber diet changes your normal diet to include more whole grains, legumes, fruits, and vegetables. Changes in the diet involve replacing refined carbohydrates with unrefined foods. The calorie level of the diet is essentially unchanged. The Dietary Reference Intake (recommended amount) for adult males is 38 grams per day. For adult  females, it is 25 grams per day. Pregnant and lactating women should consume 28 grams of fiber per day. Fiber is the intact part of a plant that is not broken down during digestion. Functional fiber is fiber that has been isolated from the plant to provide a beneficial effect in the body. PURPOSE  Increase stool bulk.   Ease and regulate bowel movements.   Lower cholesterol.   REDUCE RISK OF COLON CANCER  INDICATIONS THAT YOU NEED MORE FIBER  Constipation and hemorrhoids.   Uncomplicated diverticulosis (intestine  condition) and irritable bowel syndrome.   Weight management.   As a protective measure against hardening of the arteries (atherosclerosis), diabetes, and cancer.   GUIDELINES FOR INCREASING FIBER IN THE DIET  Start adding fiber to the diet slowly. A gradual increase of about 5 more grams (2 slices of whole-wheat bread, 2 servings of most fruits or vegetables, or 1 bowl of high-fiber cereal) per day is best. Too rapid an increase in fiber may result in constipation, flatulence, and bloating.   Drink enough water and fluids to keep your urine clear or pale yellow. Water, juice, or caffeine-free drinks are recommended. Not drinking enough fluid may cause constipation.   Eat a variety of high-fiber foods rather than one type of fiber.   Try to increase your intake of fiber through using high-fiber foods rather than fiber pills or supplements that contain small amounts of fiber.   The goal is to change the types of food eaten. Do not supplement your present diet with high-fiber foods, but replace foods in your present diet.   INCLUDE A VARIETY OF FIBER SOURCES  Replace refined and processed grains with whole grains, canned fruits with fresh fruits, and incorporate other fiber sources. White rice, white breads, and most bakery goods contain little or no fiber.   Brown whole-grain rice, buckwheat oats, and many fruits and vegetables are all good sources of fiber. These include: broccoli, Brussels sprouts, cabbage, cauliflower, beets, sweet potatoes, white potatoes (skin on), carrots, tomatoes, eggplant, squash, berries, fresh fruits, and dried fruits.   Cereals appear to be the richest source of fiber. Cereal fiber is found in whole grains and bran. Bran is the fiber-rich outer coat of cereal grain, which is largely removed in refining. In whole-grain cereals, the bran remains. In breakfast cereals, the largest amount of fiber is found in those with "bran" in their names. The fiber content is  sometimes indicated on the label.   You may need to include additional fruits and vegetables each day.   In baking, for 1 cup white flour, you may use the following substitutions:   1 cup whole-wheat flour minus 2 tablespoons.   1/2 cup white flour plus 1/2 cup whole-wheat flour.   Polyps, Colon  A polyp is extra tissue that grows inside your body. Colon polyps grow in the large intestine. The large intestine, also called the colon, is part of your digestive system. It is a long, hollow tube at the end of your digestive tract where your body makes and stores stool. Most polyps are not dangerous. They are benign. This means they are not cancerous. But over time, some types of polyps can turn into cancer. Polyps that are smaller than a pea are usually not harmful. But larger polyps could someday become or may already be cancerous. To be safe, doctors remove all polyps and test them.   WHO GETS POLYPS? Anyone can get polyps, but certain people are more likely than others.  You may have a greater chance of getting polyps if:  You are over 50.   You have had polyps before.   Someone in your family has had polyps.   Someone in your family has had cancer of the large intestine.   Find out if someone in your family has had polyps. You may also be more likely to get polyps if you:   Eat a lot of fatty foods   Smoke   Drink alcohol   Do not exercise  Eat too much   PREVENTION There is not one sure way to prevent polyps. You might be able to lower your risk of getting them if you:  Eat more fruits and vegetables and less fatty food.   Do not smoke.   Avoid alcohol.   Exercise every day.   Lose weight if you are overweight.   Eating more calcium and folate can also lower your risk of getting polyps. Some foods that are rich in calcium are milk, cheese, and broccoli. Some foods that are rich in folate are chickpeas, kidney beans, and spinach.   Hemorrhoids Hemorrhoids are dilated  (enlarged) veins around the rectum. Sometimes clots will form in the veins. This makes them swollen and painful. These are called thrombosed hemorrhoids. Causes of hemorrhoids include:  Constipation.   Straining to have a bowel movement.   HEAVY LIFTING  HOME CARE INSTRUCTIONS  Eat a well balanced diet and drink 6 to 8 glasses of water every day to avoid constipation. You may also use a bulk laxative.   Avoid straining to have bowel movements.   Keep anal area dry and clean.   Do not use a donut shaped pillow or sit on the toilet for long periods. This increases blood pooling and pain.   Move your bowels when your body has the urge; this will require less straining and will decrease pain and pressure.

## 2016-11-22 ENCOUNTER — Telehealth: Payer: Self-pay

## 2016-11-22 NOTE — Telephone Encounter (Signed)
Pt's wife called to see if there is anything different then the cream that was sent in because they can not afford it. He is having some bleeding and rectal pain.Please advise

## 2016-11-23 ENCOUNTER — Encounter (HOSPITAL_COMMUNITY): Payer: Self-pay | Admitting: Gastroenterology

## 2016-11-23 NOTE — Telephone Encounter (Signed)
PT is aware.

## 2016-11-23 NOTE — Telephone Encounter (Signed)
PLEASE CALL PT. USE PREPARATION H FOUR TIMES  A DAY FOR 7 DAYS TO RELIEVE RECTAL PAIN/PRESSURE/BLEEDING.

## 2016-11-27 ENCOUNTER — Other Ambulatory Visit: Payer: Self-pay | Admitting: Cardiovascular Disease

## 2016-11-27 MED ORDER — LOVASTATIN 20 MG PO TABS: ORAL_TABLET | ORAL | 1 refills | Status: DC

## 2016-11-27 NOTE — Telephone Encounter (Signed)
°*  STAT* If patient is at the pharmacy, call can be transferred to refill team.   1. Which medications need to be refilled? (please list name of each medication and dose if known)  lovastatin (MEVACOR) 20 MG tablet [[id number]]   2. Which pharmacy/location (including street and city if local pharmacy) is medication to be sent to? [place]  3. Do they need a 30 day or 90 day supply?  90 day

## 2016-11-27 NOTE — Telephone Encounter (Signed)
Refilled mevacor per phone request

## 2016-11-28 ENCOUNTER — Telehealth: Payer: Self-pay | Admitting: Gastroenterology

## 2016-11-28 ENCOUNTER — Other Ambulatory Visit: Payer: Self-pay

## 2016-11-28 DIAGNOSIS — K649 Unspecified hemorrhoids: Secondary | ICD-10-CM

## 2016-11-28 NOTE — Progress Notes (Signed)
PT is aware and would like referral to surgeon for his hemorrhoids.

## 2016-11-28 NOTE — Telephone Encounter (Signed)
Pt's wife called to say that patient was told that his results would be in [place] on 5/11 and they are not there. She was calling to get his results.

## 2016-11-29 NOTE — Telephone Encounter (Signed)
I spoke to pt yesterday and gave results.

## 2016-12-14 ENCOUNTER — Telehealth: Payer: Self-pay | Admitting: Cardiovascular Disease

## 2016-12-14 ENCOUNTER — Other Ambulatory Visit (HOSPITAL_COMMUNITY): Payer: Self-pay | Admitting: Psychiatry

## 2016-12-14 NOTE — Telephone Encounter (Signed)
Samples Xarelto  Given lot [id number], exp 04/20 4 bottles I have LM for Denyse Amass who is helping pt thru health Dept   Pt can't afford Amlodipine, can you suggest something else?

## 2016-12-14 NOTE — Telephone Encounter (Signed)
Patient calling in regards to receiving assistance in getting Xarelto and BP medicine (did not know name of it). / tg

## 2016-12-15 MED ORDER — LOSARTAN POTASSIUM 25 MG PO TABS: 25.0000 mg | ORAL_TABLET | Freq: Every day | ORAL | 3 refills | Status: DC

## 2016-12-15 NOTE — Addendum Note (Signed)
Addended by: Barbarann Ehlers A on: 12/15/2016 10:23 AM   Modules accepted: Orders

## 2016-12-15 NOTE — Telephone Encounter (Signed)
- 

## 2016-12-15 NOTE — Telephone Encounter (Signed)
Pt will pick up losartan at [facility] and stop amlodipine

## 2016-12-19 ENCOUNTER — Ambulatory Visit: Payer: Self-pay | Admitting: General Surgery

## 2016-12-21 ENCOUNTER — Ambulatory Visit: Payer: Self-pay | Admitting: General Surgery

## 2016-12-22 ENCOUNTER — Other Ambulatory Visit: Payer: Self-pay | Admitting: Cardiovascular Disease

## 2017-01-02 ENCOUNTER — Ambulatory Visit: Payer: Self-pay | Admitting: General Surgery

## 2017-01-03 ENCOUNTER — Telehealth (INDEPENDENT_AMBULATORY_CARE_PROVIDER_SITE_OTHER): Payer: Self-pay | Admitting: Radiology

## 2017-01-03 NOTE — Telephone Encounter (Signed)
noted 

## 2017-01-03 NOTE — Telephone Encounter (Signed)
FYI. Patients wife calling today requesting appointment with Dr.Yates. Patient felt catching in lower back last week with radicular pain down right leg with progressive worsening. I made an appointment tomorrow at 345 with Dr. Lorin Mercy. He saw Dr. Lorin Mercy last year. Patient lives in [place]. His cardiologist recommended against NSAIDS due to him already being on xarleto.

## 2017-01-04 ENCOUNTER — Ambulatory Visit (INDEPENDENT_AMBULATORY_CARE_PROVIDER_SITE_OTHER): Payer: Self-pay | Admitting: Orthopaedic Surgery

## 2017-01-04 VITALS — BP 129/77 | HR 62 | Ht 71.0 in | Wt 214.0 lb

## 2017-01-04 DIAGNOSIS — M545 Low back pain, unspecified: Secondary | ICD-10-CM

## 2017-01-04 MED ORDER — HYDROCODONE-ACETAMINOPHEN 5-325 MG PO TABS: ORAL_TABLET | ORAL | 0 refills | Status: DC

## 2017-01-04 NOTE — Progress Notes (Addendum)
Office Visit Note   Patient: Richard [name]           Date of Birth: 09-24-1965           MRN: [id number] Visit Date: 01/04/2017              Requested by: Royce Macadamia D., PA-C [address] [place], [place] [address] PCP: Raiford Simmonds., PA-C   Assessment & Plan: Visit Diagnoses:  1. Acute right-sided low back pain without sciatica     Plan: Patient's symptoms are more likely related to L1-2 disc rather than the 45 minimal bulge that he had in the past on previous MRI. Prescription for pain medication given since anti-inflammatories are not an option and with his diabetes I like to avoid the prednisone to avoid hyperglycemia.  Follow-Up Instructions: No Follow-up on file.   Orders:  No orders of the defined types were placed in this encounter.  Meds ordered this encounter  Medications  . HYDROcodone-acetaminophen (NORCO/VICODIN) 5-325 MG tablet    Sig: Take one to two tablets every 4-6 hours as needed for pain    Dispense:  40 tablet    Refill:  0      Procedures: No procedures performed   Clinical Data: No additional findings.   Subjective: Chief Complaint  Patient presents with  . Lower Back - Pain    HPI 51 year old male presents with acute pain that began on June 11. He's had some problems with his back in the past. Been doing well. He was riding his lawnmower turned the wheel as he twisted suddenly felt sharp pain in his back with pain in his back that radiated into his right lateral hip and proximal right thigh and stopped. He is a great difficulty getting from sitting to standing once he is upright he is comfortable. No bowel or bladder symptoms no fever chills. No history of  Review of Systems 14 point review of systems positive for hypertension anxiety, depression, past history of low back pain, coronary artery disease, TIA, hepatic fatty liver Past history shallow disc bulge L1 to. Minimal bulge at L4-5. Otherwise negative as it pertains as  history of present illness. Patient's unable take anti-inflammatories and takes Xarelto for previous TIAs. Type 2 diabetes oral medication control.  Objective: Vital Signs: BP 129/77   Pulse 62   Ht 5\' 11"  (1.803 m)   Wt 214 lb (97.1 kg)   BMI 29.85 kg/m   Physical Exam  Constitutional: He is oriented to person, place, and time. He appears well-developed and well-nourished.  HENT:  Head: Normocephalic and atraumatic.  Eyes: EOM are normal. Pupils are equal, round, and reactive to light.  Neck: No tracheal deviation present. No thyromegaly present.  Cardiovascular: Normal rate.   Pulmonary/Chest: Effort normal. He has no wheezes.  Abdominal: Soft. Bowel sounds are normal.  Musculoskeletal:  Patient has some mild lumbar spasms with the rotation on the right side tenderness. Positive reverse straight leg raise. Negative straight leg raise negative popliteal compression. Anterior tib EHL is strong. Some pain with resisted hip flexion. No hip adductor weakness. Distal pulses are 2+. Plantar foot lesions.  Neurological: He is alert and oriented to person, place, and time.  Skin: Skin is warm and dry. Capillary refill takes less than 2 seconds.  Psychiatric: He has a normal mood and affect. His behavior is normal. Judgment and thought content normal.    Ortho Exam  Specialty Comments:  No specialty comments available.  Imaging: Two-view lumbar spine x-rays were obtained that showed some degenerative spurring mild narrowing endplate spurring at O8-4. No anterolisthesis at other levels L4-5 space disc has not changed comparison previous MRI 2013. Mild calcification of the abdominal aorta likely related to smoking and PAD which was discussed  PMFS History: Patient Active Problem List   Diagnosis Date Noted  . Constipation 12/24/2015  . Dyspnea 11/17/2015  . Coronary artery disease due to lipid rich plaque   . Heart palpitations 08/12/2015  . TIA (transient ischemic attack) 08/12/2015    . Colon cancer screening 08/02/2015  . Abdominal pain 12/18/2014  . Encounter for screening colonoscopy 12/18/2014  . Unspecified vitamin D deficiency 08/20/2012  . Arteriosclerotic cardiovascular disease (ASCVD) 04/11/2012  . Chronic low back pain   . Essential hypertension   . Anxiety and depression   . DIABETES MELLITUS-TYPE II 10/03/2010  . Cigarette smoker 09/14/2010  . CHRONIC OBSTRUCTIVE PULMONARY DISEASE 09/14/2010  . HLD (hyperlipidemia) 11/25/2009  . GASTROESOPHAGEAL REFLUX DISEASE 04/05/2009  . Hepatic steatosis 04/05/2009   Past Medical History:  Diagnosis Date  . Arthritis    "legs, spine, shoulders" (08/12/2015)  . Asthma   . Chest pain    + palpitations; cath 2005- 30-40% mid LAD, 20% D1, 20% cx, OM, 20-30% RCA, and EF-55%  . Colitis 1990  . COPD (chronic obstructive pulmonary disease) ([place])   . Depression   . Dysrhythmia    AFib  . Gastric ulcer 2003; 2012   2003: + esophagitis; negative H.pylori serology  2012: Dr. Oneida Alar, mild gastritis, Bravo PH probe placement, negative H.pylori  . GERD (gastroesophageal reflux disease)   . Hepatic steatosis   . History of hiatal hernia   . Hyperlipemia   . Hypertension   . Overweight   . Panic attacks   . Paroxysmal atrial fibrillation (HCC)   . Stroke [facility])    mucsle in left side of face dosent work as its supposed to-only deficit.  [name] TIA (transient ischemic attack)    after cath 1/ 17  . Tobacco abuse    1/2 pack per day  . Type II diabetes mellitus (HCC)     Family History  Problem Relation Age of Onset  . Lung cancer Mother   . Alcohol abuse Mother   . Heart attack Father 51  . Diabetes Father   . Alcohol abuse Father   . Hypertension Brother   . Hypertension Brother   . Anxiety disorder Sister   . Depression Sister   . Anxiety disorder Sister   . Heart attack Brother 38  . Diabetes Brother   . Hypertension Brother   . Seizures Brother   . Dementia Paternal Uncle   . Dementia Cousin   . ADD /  ADHD Daughter   . Colon cancer Neg Hx   . Drug abuse Neg Hx   . Bipolar disorder Neg Hx   . OCD Neg Hx   . Paranoid behavior Neg Hx   . Schizophrenia Neg Hx   . Sexual abuse Neg Hx   . Physical abuse Neg Hx     Past Surgical History:  Procedure Laterality Date  . BRAVO [place] STUDY  05/03/2011   ZYS:AYTK gastritis/normal esophagus and duodenum  . CARDIAC CATHETERIZATION  1990s X 1; 2005; 08/12/2015  . CARDIAC CATHETERIZATION N/A 08/12/2015   Procedure: Left Heart Cath and Coronary Angiography;  Surgeon: Belva Crome, MD; LAD 20%, CFX 65%, RCA 20%, EF 60%   . COLONOSCOPY  1990  . COLONOSCOPY  WITH PROPOFOL N/A 11/21/2016   Procedure: COLONOSCOPY WITH PROPOFOL;  Surgeon: [name], MD;  [facility];  Service: Endoscopy;  Laterality: N/A;  7:30AM  . ESOPHAGOGASTRODUODENOSCOPY  05/03/2011   YSH:UOHF gastritis  . NECK MASS EXCISION Right    "done in dr's office; behind right ear/side of ncek"  . POLYPECTOMY  11/21/2016   Procedure: POLYPECTOMY;  Surgeon: [name], MD;  Location: [facility];  Service: Endoscopy;;  descending colon polyp  . SHOULDER ARTHROSCOPY W/ ROTATOR CUFF REPAIR Right 2006   acromioclavicular joint arthrosis   Social History   Occupational History  . full time Unemployed   Social History Main Topics  . Smoking status: Current Every Day Smoker    Packs/day: 0.50    Years: 25.00    Types: Cigarettes    Start date: 07/17/1982  . Smokeless tobacco: Never Used  . Alcohol use No  . Drug use: No  . Sexual activity: Yes    Birth control/ protection: None

## 2017-01-18 ENCOUNTER — Ambulatory Visit (INDEPENDENT_AMBULATORY_CARE_PROVIDER_SITE_OTHER): Payer: Self-pay | Admitting: Orthopaedic Surgery

## 2017-01-20 ENCOUNTER — Other Ambulatory Visit (HOSPITAL_COMMUNITY): Payer: Self-pay | Admitting: Nurse Practitioner

## 2017-01-25 ENCOUNTER — Encounter (INDEPENDENT_AMBULATORY_CARE_PROVIDER_SITE_OTHER): Payer: Self-pay | Admitting: Orthopaedic Surgery

## 2017-01-25 ENCOUNTER — Ambulatory Visit (INDEPENDENT_AMBULATORY_CARE_PROVIDER_SITE_OTHER): Payer: Self-pay | Admitting: Orthopaedic Surgery

## 2017-01-25 VITALS — BP 108/70 | HR 66

## 2017-01-25 DIAGNOSIS — M545 Low back pain, unspecified: Secondary | ICD-10-CM

## 2017-01-25 DIAGNOSIS — G8929 Other chronic pain: Secondary | ICD-10-CM

## 2017-01-25 NOTE — Progress Notes (Signed)
Office Visit Note   Patient: Richard [name]           Date of Birth: 02-Apr-1966           MRN: [id number] Visit Date: 01/25/2017              Requested by: Royce Macadamia D., PA-C [address] [place], [place] [address] PCP: Raiford Simmonds., PA-C   Assessment & Plan: Visit Diagnoses:  1. Chronic low back pain without sciatica, unspecified back pain laterality     Plan: Patient got improvement with conservative treatment. He'll continue with the walking program ,work on weight loss ,core strengthening. He'll return if he has increase in symptoms.  Follow-Up Instructions: Return if symptoms worsen or fail to improve.   Orders:  No orders of the defined types were placed in this encounter.  No orders of the defined types were placed in this encounter.     Procedures: No procedures performed   Clinical Data: No additional findings.   Subjective: Chief Complaint  Patient presents with  . Lower Back - Pain, Follow-up    HPI patient transfer follow-up of the back pain that began June 11. Since his last office visit 01/04/17 he states he is over 50% better. He is moving better use of hydrocodone heat, ice and rest. He is walking moving better turning and to get from sitting to standing much more easily.  Review of Systems is systems updated, 14 point and is unchanged from 01/04/2017 other than as mentioned in history of present illness.   Objective: Vital Signs: BP 108/70   Pulse 66   Physical Exam  Constitutional: He is oriented to person, place, and time. He appears well-developed and well-nourished.  HENT:  Head: Normocephalic and atraumatic.  Eyes: Pupils are equal, round, and reactive to light. EOM are normal.  Neck: No tracheal deviation present. No thyromegaly present.  Cardiovascular: Normal rate.   Pulmonary/Chest: Effort normal. He has no wheezes.  Abdominal: Soft. Bowel sounds are normal.  Neurological: He is alert and oriented to person, place,  and time.  Skin: Skin is warm and dry. Capillary refill takes less than 2 seconds.  Psychiatric: He has a normal mood and affect. His behavior is normal. Judgment and thought content normal.    Ortho Exam Patient has some discomfort with rotation of the lumbar spine. Negative straight leg raising. Anterior tib EHL is strong. Normal hip range of motion. Distal pulses are intact. No atrophy in the quad, hamstring or calf muscles. Specialty Comments:  No specialty comments available.  Imaging: No results found.   PMFS History: Patient Active Problem List   Diagnosis Date Noted  . Constipation 12/24/2015  . Dyspnea 11/17/2015  . Coronary artery disease due to lipid rich plaque   . Heart palpitations 08/12/2015  . TIA (transient ischemic attack) 08/12/2015  . Colon cancer screening 08/02/2015  . Abdominal pain 12/18/2014  . Encounter for screening colonoscopy 12/18/2014  . Unspecified vitamin D deficiency 08/20/2012  . Arteriosclerotic cardiovascular disease (ASCVD) 04/11/2012  . Chronic low back pain   . Essential hypertension   . Anxiety and depression   . DIABETES MELLITUS-TYPE II 10/03/2010  . Cigarette smoker 09/14/2010  . CHRONIC OBSTRUCTIVE PULMONARY DISEASE 09/14/2010  . HLD (hyperlipidemia) 11/25/2009  . GASTROESOPHAGEAL REFLUX DISEASE 04/05/2009  . Hepatic steatosis 04/05/2009   Past Medical History:  Diagnosis Date  . Arthritis    "legs, spine, shoulders" (08/12/2015)  . Asthma   . Chest  pain    + palpitations; cath 2005- 30-40% mid LAD, 20% D1, 20% cx, OM, 20-30% RCA, and EF-55%  . Colitis 1990  . COPD (chronic obstructive pulmonary disease) ([place])   . Depression   . Dysrhythmia    AFib  . Gastric ulcer 2003; 2012   2003: + esophagitis; negative H.pylori serology  2012: Dr. Oneida Alar, mild gastritis, Bravo PH probe placement, negative H.pylori  . GERD (gastroesophageal reflux disease)   . Hepatic steatosis   . History of hiatal hernia   . Hyperlipemia   .  Hypertension   . Overweight   . Panic attacks   . Paroxysmal atrial fibrillation (HCC)   . Stroke [facility])    mucsle in left side of face dosent work as its supposed to-only deficit.  [name] TIA (transient ischemic attack)    after cath 1/ 17  . Tobacco abuse    1/2 pack per day  . Type II diabetes mellitus (HCC)     Family History  Problem Relation Age of Onset  . Lung cancer Mother   . Alcohol abuse Mother   . Heart attack Father 50  . Diabetes Father   . Alcohol abuse Father   . Hypertension Brother   . Hypertension Brother   . Anxiety disorder Sister   . Depression Sister   . Anxiety disorder Sister   . Heart attack Brother 69  . Diabetes Brother   . Hypertension Brother   . Seizures Brother   . Dementia Paternal Uncle   . Dementia Cousin   . ADD / ADHD Daughter   . Colon cancer Neg Hx   . Drug abuse Neg Hx   . Bipolar disorder Neg Hx   . OCD Neg Hx   . Paranoid behavior Neg Hx   . Schizophrenia Neg Hx   . Sexual abuse Neg Hx   . Physical abuse Neg Hx     Past Surgical History:  Procedure Laterality Date  . BRAVO [place] STUDY  05/03/2011   PTW:[facility] gastritis/normal esophagus and duodenum  . CARDIAC CATHETERIZATION  1990s X 1; 2005; 08/12/2015  . CARDIAC CATHETERIZATION N/A 08/12/2015   Procedure: Left Heart Cath and Coronary Angiography;  Surgeon: Belva Crome, MD; LAD 20%, CFX 65%, RCA 20%, EF 60%   . COLONOSCOPY  1990  . COLONOSCOPY WITH PROPOFOL N/A 11/21/2016   Procedure: COLONOSCOPY WITH PROPOFOL;  Surgeon: [name], MD;  [facility];  Service: Endoscopy;  Laterality: N/A;  7:30AM  . ESOPHAGOGASTRODUODENOSCOPY  05/03/2011   LEX:NTZG gastritis  . NECK MASS EXCISION Right    "done in dr's office; behind right ear/side of ncek"  . POLYPECTOMY  11/21/2016   Procedure: POLYPECTOMY;  Surgeon: [name], MD;  [facility];  Service: Endoscopy;;  descending colon polyp  . SHOULDER ARTHROSCOPY W/ ROTATOR CUFF REPAIR Right 2006    acromioclavicular joint arthrosis   Social History   Occupational History  . full time Unemployed   Social History Main Topics  . Smoking status: Current Every Day Smoker    Packs/day: 0.50    Years: 25.00    Types: Cigarettes    Start date: 07/17/1982  . Smokeless tobacco: Never Used  . Alcohol use No  . Drug use: No  . Sexual activity: Yes    Birth control/ protection: None

## 2017-02-13 ENCOUNTER — Ambulatory Visit (INDEPENDENT_AMBULATORY_CARE_PROVIDER_SITE_OTHER): Payer: Self-pay | Admitting: Psychiatry

## 2017-02-13 ENCOUNTER — Encounter (HOSPITAL_COMMUNITY): Payer: Self-pay | Admitting: Psychiatry

## 2017-02-13 VITALS — BP 122/88 | HR 58 | Ht 71.0 in | Wt 210.4 lb

## 2017-02-13 DIAGNOSIS — F419 Anxiety disorder, unspecified: Secondary | ICD-10-CM

## 2017-02-13 DIAGNOSIS — F410 Panic disorder [episodic paroxysmal anxiety] without agoraphobia: Secondary | ICD-10-CM

## 2017-02-13 DIAGNOSIS — F4001 Agoraphobia with panic disorder: Secondary | ICD-10-CM

## 2017-02-13 DIAGNOSIS — Z810 Family history of intellectual disabilities: Secondary | ICD-10-CM

## 2017-02-13 DIAGNOSIS — Z811 Family history of alcohol abuse and dependence: Secondary | ICD-10-CM

## 2017-02-13 DIAGNOSIS — F329 Major depressive disorder, single episode, unspecified: Secondary | ICD-10-CM

## 2017-02-13 DIAGNOSIS — Z818 Family history of other mental and behavioral disorders: Secondary | ICD-10-CM

## 2017-02-13 DIAGNOSIS — M549 Dorsalgia, unspecified: Secondary | ICD-10-CM

## 2017-02-13 MED ORDER — TRAZODONE HCL 50 MG PO TABS: 50.0000 mg | ORAL_TABLET | Freq: Every day | ORAL | 2 refills | Status: DC

## 2017-02-13 MED ORDER — ALPRAZOLAM 0.5 MG PO TABS: ORAL_TABLET | ORAL | 2 refills | Status: DC

## 2017-02-13 NOTE — Progress Notes (Signed)
Patient ID: DAVELLE ANSELMI, male   DOB: 01/03/1966, 51 y.o.   MRN: [id number] Patient ID: REGAN LLORENTE, male   DOB: Oct 05, 1965, 51 y.o.   MRN: [id number] Patient ID: RAHEEL KUNKLE, male   DOB: 05/23/1966, 51 y.o.   MRN: [id number] Patient ID: COURTNEY FENLON, male   DOB: [birth year], 51 y.o.   MRN: [id number] Patient ID: ONUR MORI, male   DOB: [birth year], 51 y.o.   MRN: [id number] Patient ID: RAYSEAN GRAUMANN, male   DOB: 08/18/1965, 51 y.o.   MRN: [id number] Patient ID: ROMMIE DUNN, male   DOB: [birth year], 51 y.o.   MRN: [id number] Patient ID: KAVI ALMQUIST, male   DOB: [birth year], 51 y.o.   MRN: [id number] Patient ID: SHO SALGUERO, male   DOB: [birth year], 51 y.o.   MRN: [id number] Patient ID: IOANNIS SCHUH, male   DOB: Jan 08, 1966, 51 y.o.   MRN: [id number] Patient ID: ZVI DUPLANTIS, male   DOB: 1966/06/07, 51 y.o.   MRN: [id number] Patient ID: RAYANE GALLARDO, male   DOB: December 26, 1965, 51 y.o.   MRN: [id number] Patient ID: FOTIOS AMOS, male   DOB: [birth year], 51 y.o.   MRN: [id number] Patient ID: ELIJAHJAMES FUELLING, male   DOB: 21-Mar-1966, 52 y.o.   MRN: [id number] Patient ID: VICKY MCCANLESS, male   DOB: 11/27/1965, 51 y.o.   MRN: [id number] Patient ID: CINCH ORMOND, male   DOB: [birth year], 51 y.o.   MRN: [id number] [facility] [address] Progress Note WEAVER TWEED MRN: [id number] DOB: [birth year] Age: 51 y.o.  Date: 02/13/2017  Chief Complaint  Patient presents with  . Depression  . Anxiety  . Follow-up   History of Chief Complaint:   This 51 year old Caucasian male who came for his followup appointment. He is currently living with his wife in [place]. He is unemployed and applying for disability.  The patient states that he has been employed for 5 years. 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 He got injured on the job and both shoulders were torn. He has not been able to work ever since and he feels very badly about this. 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  Since he's not been able to work the patient has been having increasingly depressed and anxious. He feels like his body gives out when he is trying to exert himself even in a minor way. He's been in the ER recently for chest pain which was ruled out as a panic attack. He also saw cardiologist this week and will be having a stress test. His blood pressure was too low and his lisinopril was cut down. He is also trying to quit smoking by using electronic cigarettes.  The patient returns after 3 months. He states that he is stressed and unable to sleep. He states he only sleeps about 2 hours a night. He states that his heart goes into A. fib every night and he can't go to sleep. I strongly encouraged him to tell his cardiologist about this. He claims of flecainide makes his atrial fibrillation worse. He is scared of antidepressants thinking that they will give him hallucinations. I stressed that he needs to get his sleep because he is having memory loss body aches and fatigue through the day. He agrees to try trazodone at night. The Xanax continues to help somewhat with his panic attacks. His  last therapist left the practice and now that we have a new one I strongly urged him to do this to help manage his stress and he agrees Anxiety  Presents for follow-up visit. Symptoms include nervous/anxious behavior.    Depression         Associated symptoms include fatigue.  Past medical history includes anxiety.    Review of Systems  Constitutional: Positive for fatigue.  Gastrointestinal: Negative.   Musculoskeletal: Positive for back pain.  Skin: Negative.   Psychiatric/Behavioral: Positive for depression, dysphoric mood and sleep disturbance. The patient is nervous/anxious.     Physical Exam Vitals: BP 122/88 (BP Location: Right Arm, Patient Position: Sitting, Cuff Size: Large)   Pulse (!) 58   Ht 5\' 11"  (1.803 m)   Wt 210 lb 6.4 oz (95.4 kg)   BMI 29.34  kg/m    Past Psychiatric History: Diagnosis: none  Hospitalizations: none  Outpatient Care: none  Substance Abuse Care: none  Self-Mutilation: none  Suicidal Attempts: none  Violent Behaviors: none   Allergies: Allergies  Allergen Reactions  . Dexilant [Dexlansoprazole] Anaphylaxis  . Mushroom Ext Cmplx(Shiitake-Reishi-Mait) Anaphylaxis    Rapid heart rate.  . Penicillins Anaphylaxis    Has patient had a PCN reaction causing immediate rash, facial/tongue/throat swelling, SOB or lightheadedness with hypotension: Yes Has patient had a PCN reaction causing severe rash involving mucus membranes or skin necrosis: No Has patient had a PCN reaction that required hospitalization Yes Has patient had a PCN reaction occurring within the last 10 years: No If all of the above answers are "NO", then may proceed with Cephalosporin use.   [name] Doxycycline    Medical History: Past Medical History:  Diagnosis Date  . Arthritis    "legs, spine, shoulders" (08/12/2015)  . Asthma   . Chest pain    + palpitations; cath 2005- 30-40% mid LAD, 20% D1, 20% cx, OM, 20-30% RCA, and EF-55%  . Colitis 1990  . COPD (chronic obstructive pulmonary disease) ([place])   . Depression   . Dysrhythmia    AFib  . Gastric ulcer 2003; 2012   2003: + esophagitis; negative H.pylori serology  2012: Dr. Oneida Alar, mild gastritis, Bravo PH probe placement, negative H.pylori  . GERD (gastroesophageal reflux disease)   . Hepatic steatosis   . History of hiatal hernia   . Hyperlipemia   . Hypertension   . Overweight   . Panic attacks   . Paroxysmal atrial fibrillation (HCC)   . Stroke [facility])    mucsle in left side of face dosent work as its supposed to-only deficit.  [name] TIA (transient ischemic attack)    after cath 1/ 17  . Tobacco abuse    1/2 pack per day  . Type II diabetes mellitus ([place])    Surgical History: Past Surgical History:  Procedure Laterality Date  . BRAVO [place] STUDY  05/03/2011   ION:GEXB  gastritis/normal esophagus and duodenum  . CARDIAC CATHETERIZATION  1990s X 1; 2005; 08/12/2015  . CARDIAC CATHETERIZATION N/A 08/12/2015   Procedure: Left Heart Cath and Coronary Angiography;  Surgeon: Belva Crome, MD; LAD 20%, CFX 65%, RCA 20%, EF 60%   . COLONOSCOPY  1990  . COLONOSCOPY WITH PROPOFOL N/A 11/21/2016   Procedure: COLONOSCOPY WITH PROPOFOL;  Surgeon: [name], MD;  Location: [facility];  Service: Endoscopy;  Laterality: N/A;  7:30AM  . ESOPHAGOGASTRODUODENOSCOPY  05/03/2011   MWU:XLKG gastritis  . NECK MASS EXCISION Right    "done in dr's office; behind  right ear/side of ncek"  . POLYPECTOMY  11/21/2016   Procedure: POLYPECTOMY;  Surgeon: [name], MD;  Location: [facility] END[facility] SUITE;  Service: Endoscopy;;  descending colon polyp  . SHOULDER ARTHROSCOPY W/ ROTATOR CUFF REPAIR Right 2006   acromioclavicular joint arthrosis   Family History: family history includes ADD / ADHD in his daughter; Alcohol abuse in his father and mother; Anxiety disorder in his sister and sister; Dementia in his cousin and paternal uncle; Depression in his sister; Diabetes in his brother and father; Heart attack (age of onset: 12) in his brother; Heart attack (age of onset: 89) in his father; Hypertension in his brother, brother, and brother; Lung cancer in his mother; Seizures in his brother. Reviewed again today and nothing new.   Current Medications:  Current Outpatient Prescriptions  Medication Sig Dispense Refill  . acetaminophen (TYLENOL) 325 MG tablet Take 650 mg by mouth every 6 (six) hours as needed for pain or fever.     [name] albuterol (VENTOLIN HFA) 108 (90 BASE) MCG/ACT inhaler Inhale 2 puffs into the lungs every 6 (six) hours as needed for wheezing or shortness of breath.     . ALPRAZolam (XANAX) 0.5 MG tablet Take one tablet by mouth three times a day and two tablets at night 150 tablet 2  . budesonide-formoterol (SYMBICORT) 80-4.5 MCG/ACT inhaler Inhale 2 puffs into the lungs 2  (two) times daily as needed.     . calcium carbonate (TUMS - DOSED IN MG ELEMENTAL CALCIUM) 500 MG chewable tablet Chew 1 tablet by mouth daily as needed for indigestion or heartburn.    . cyclobenzaprine (FLEXERIL) 10 MG tablet Take 1 tablet (10 mg total) by mouth 3 (three) times daily. (Patient taking differently: Take 10 mg by mouth 3 (three) times daily as needed for muscle spasms. ) 21 tablet 0  . esomeprazole (NEXIUM) 40 MG capsule Take 1 capsule (40 mg total) by mouth 2 (two) times daily before a meal. (Patient taking differently: Take 40 mg by mouth daily. ) 180 capsule 3  . flecainide (TAMBOCOR) 100 MG tablet TAKE 1 TABLET BY MOUTH TWICE DAILY 60 tablet 3  . HYDROcodone-acetaminophen (NORCO/VICODIN) 5-325 MG tablet Take one to two tablets every 4-6 hours as needed for pain 40 tablet 0  . losartan (COZAAR) 25 MG tablet Take 1 tablet (25 mg total) by mouth daily. 30 tablet 3  . lovastatin (MEVACOR) 20 MG tablet TAKE ONE TABLET BY MOUTH ONCE DAILY AT 6:OO PM 90 tablet 1  . metFORMIN (GLUCOPHAGE) 500 MG tablet Take 1 tablet (500 mg total) by mouth 2 (two) times daily with a meal. 30 tablet 2  . metoprolol succinate (TOPROL-XL) 50 MG 24 hr tablet Take 75 mg by mouth daily. Take with or immediately following a meal.    . metoprolol succinate (TOPROL-XL) 50 MG 24 hr tablet TAKE ONE & ONE-HALF TABLETS BY MOUTH ONCE DAILY 45 tablet 3  . nitroGLYCERIN (NITROSTAT) 0.4 MG SL tablet Place 1 tablet (0.4 mg total) under the tongue every 5 (five) minutes as needed for chest pain. 25 tablet 3  . pramoxine-hydrocortisone (PROCTOCREAM-HC) 1-1 % rectal cream USE PR 4 TIMES A DAY FOR 7 DAYS. 30 g 0  . XARELTO 20 MG TABS tablet TAKE 1 Tablet BY MOUTH ONCE DAILY AT DINNER  3  . traZODone (DESYREL) 50 MG tablet Take 1 tablet (50 mg total) by mouth at bedtime. 30 tablet 2   No current facility-administered medications for this visit.  Previous Psychotropic Medications: Medication Dose   Xanax     Substance  Abuse History in the last 12 months: Substance Age of 1st Use Last Use Amount Specific Type  Nicotine  17  2 hours ago  1  cigarette  Alcohol  22  38      Cannabis  none        Opiates  36  today  2.5 mg  hyrdocodone  Cocaine  none        Methamphetamines  none        LSD  none        Ecstasy  none         Benzodiazepines  22  started in early thrities  0.5 mg  Xanax  Caffeine  childhood  this AM  1 cup  [place]  Inhalants  none        Others:      Sugar  11  this AM  20 tsps  in the [place] Consequences of Substance Abuse: pain Legal Consequences of Substance Abuse: none Family Consequences of Substance Abuse: none Blackouts:  No DT's:  No Withdrawal Symptoms:  Yes Tremors  Social History: Patient lives with his life, daughter and her husband with 2 grandbabies.  He has 2 sons and the daughter.  He is a Programmer, systems.    Mental Status Examination/Evaluation: Objective:  Appearance: Casual  Eye Contact::  Good  Speech:  Clear and Coherent  Volume:  Normal  Mood:  anxious And irritable   Affect:  Congruent  Thought Process:  Coherent, Intact and Linear  Orientation:  Full (Time, Place, and Person)  Thought Content:  WDL  Suicidal Thoughts:  No  Homicidal Thoughts:  No  Judgement:  Good  Insight:  Fair  Psychomotor Activity:  Normal  Akathisia:  No  Handed:  Right  AIMS (if indicated):    Assets:  Communication Skills Desire for Improvement   Lab Results:  Results for orders placed or performed during the hospital encounter of 11/21/16 (from the past 8736 hour(s))  Glucose, capillary   Collection Time: 11/21/16  6:34 AM  Result Value Ref Range   Glucose-Capillary 174 (H) 65 - 99 mg/dL  Glucose, capillary   Collection Time: 11/21/16  8:04 AM  Result Value Ref Range   Glucose-Capillary 155 (H) 65 - 99 mg/dL  Results for orders placed or performed during the hospital encounter of 11/16/16 (from the past 8736 hour(s))  CBC with Differential/Platelet    Collection Time: 11/16/16  3:11 PM  Result Value Ref Range   WBC 5.5 4.0 - 10.5 K/uL   RBC 5.04 4.22 - 5.81 MIL/uL   Hemoglobin 15.9 13.0 - 17.0 g/dL   HCT 45.0 39.0 - 52.0 %   MCV 89.3 78.0 - 100.0 fL   MCH 31.5 26.0 - 34.0 pg   MCHC 35.3 30.0 - 36.0 g/dL   RDW 12.6 11.5 - 15.5 %   Platelets 185 150 - 400 K/uL   Neutrophils Relative % 50 %   Neutro Abs 2.7 1.7 - 7.7 K/uL   Lymphocytes Relative 41 %   Lymphs Abs 2.3 0.7 - 4.0 K/uL   Monocytes Relative 6 %   Monocytes Absolute 0.3 0.1 - 1.0 K/uL   Eosinophils Relative 3 %   Eosinophils Absolute 0.2 0.0 - 0.7 K/uL   Basophils Relative 0 %   Basophils Absolute 0.0 0.0 - 0.1 K/uL  Basic metabolic panel   Collection Time: 11/16/16  3:11 PM  Result Value Ref Range   Sodium 136 135 - 145 mmol/L   Potassium 3.7 3.5 - 5.1 mmol/L   Chloride 105 101 - 111 mmol/L   CO2 23 22 - 32 mmol/L   Glucose, Bld 218 (H) 65 - 99 mg/dL   BUN 14 6 - 20 mg/dL   Creatinine, Ser 0.92 0.61 - 1.24 mg/dL   Calcium 8.9 8.9 - 10.3 mg/dL   GFR calc non Af Amer >60 >60 mL/min   GFR calc Af Amer >60 >60 mL/min   Anion gap 8 5 - 15  Results for orders placed or performed during the hospital encounter of 07/28/16 (from the past 8736 hour(s))  Basic metabolic panel   Collection Time: 07/28/16 12:15 PM  Result Value Ref Range   Sodium 137 135 - 145 mmol/L   Potassium 4.0 3.5 - 5.1 mmol/L   Chloride 103 101 - 111 mmol/L   CO2 25 22 - 32 mmol/L   Glucose, Bld 244 (H) 65 - 99 mg/dL   BUN 11 6 - 20 mg/dL   Creatinine, Ser 1.05 0.61 - 1.24 mg/dL   Calcium 9.2 8.9 - 10.3 mg/dL   GFR calc non Af Amer >60 >60 mL/min   GFR calc Af Amer >60 >60 mL/min   Anion gap 9 5 - 15  Magnesium   Collection Time: 07/28/16 12:15 PM  Result Value Ref Range   Magnesium 2.0 1.7 - 2.4 mg/dL  Results for orders placed or performed during the hospital encounter of 04/18/16 (from the past 8736 hour(s))  CBC   Collection Time: 04/18/16 10:44 AM  Result Value Ref Range   WBC 6.4  4.0 - 10.5 K/uL   RBC 5.17 4.22 - 5.81 MIL/uL   Hemoglobin 16.2 13.0 - 17.0 g/dL   HCT 46.9 39.0 - 52.0 %   MCV 90.7 78.0 - 100.0 fL   MCH 31.3 26.0 - 34.0 pg   MCHC 34.5 30.0 - 36.0 g/dL   RDW 12.7 11.5 - 15.5 %   Platelets 190 150 - 400 K/uL  Basic metabolic panel   Collection Time: 04/18/16 10:44 AM  Result Value Ref Range   Sodium 139 135 - 145 mmol/L   Potassium 3.6 3.5 - 5.1 mmol/L   Chloride 108 101 - 111 mmol/L   CO2 25 22 - 32 mmol/L   Glucose, Bld 236 (H) 65 - 99 mg/dL   BUN 12 6 - 20 mg/dL   Creatinine, Ser 0.90 0.61 - 1.24 mg/dL   Calcium 9.1 8.9 - 10.3 mg/dL   GFR calc non Af Amer >60 >60 mL/min   GFR calc Af Amer >60 >60 mL/min   Anion gap 6 5 - 15    Assessment:   AXIS I Generalized Anxiety Disorder, Major Depression, Recurrent severe and Panic Disorder  AXIS II Deferred  AXIS III Past Medical History:  Diagnosis Date  . Arthritis    "legs, spine, shoulders" (08/12/2015)  . Asthma   . Chest pain    + palpitations; cath 2005- 30-40% mid LAD, 20% D1, 20% cx, OM, 20-30% RCA, and EF-55%  . Colitis 1990  . COPD (chronic obstructive pulmonary disease) ([place])   . Depression   . Dysrhythmia    AFib  . Gastric ulcer 2003; 2012   2003: + esophagitis; negative H.pylori serology  2012: Dr. Oneida Alar, mild gastritis, Bravo PH probe placement, negative H.pylori  . GERD (gastroesophageal reflux disease)   . Hepatic steatosis   . History of  hiatal hernia   . Hyperlipemia   . Hypertension   . Overweight   . Panic attacks   . Paroxysmal atrial fibrillation (HCC)   . Stroke [facility])    mucsle in left side of face dosent work as its supposed to-only deficit.  [name] TIA (transient ischemic attack)    after cath 1/ 17  . Tobacco abuse    1/2 pack per day  . Type II diabetes mellitus (HCC)      AXIS IV other psychosocial or environmental problems  AXIS V 41-50 serious symptoms   Treatment Plan/Recommendations: Laboratory:    Psychotherapy: He agrees to restart therapy here    Medications:  He will continue  Xanax 0.5 mg 3 times a day and 1 mg at bedtime. He was start trazodone 50 mg at bedtime for sleep   Routine PRN Medications:  No  Consultations:    Safety Concerns:  none  Other:  Return in 2 months      MEDICATIONS this encounter: Meds ordered this encounter  Medications  . traZODone (DESYREL) 50 MG tablet    Sig: Take 1 tablet (50 mg total) by mouth at bedtime.    Dispense:  30 tablet    Refill:  2  . ALPRAZolam (XANAX) 0.5 MG tablet    Sig: Take one tablet by mouth three times a day and two tablets at night    Dispense:  150 tablet    Refill:  2   Medical Decision Making Problem Points:  Established problem, stable/improving (1), Review of last therapy session (1) and Review of psycho-social stressors (1) Data Points:  Review or order clinical lab tests (1) Review of medication regiment & side effects (2) Review of new medications or change in dosage (2)  I certify that outpatient services furnished can reasonably be expected to improve the patient's condition.   [name], MD Patient ID: CHRISTIA COAXUM, male   DOB: 10-16-1965, 51 y.o.   MRN: [id number]

## 2017-03-08 ENCOUNTER — Ambulatory Visit (HOSPITAL_COMMUNITY): Payer: Self-pay | Admitting: Licensed Clinical Social Worker

## 2017-03-12 ENCOUNTER — Encounter: Payer: Self-pay | Admitting: Cardiovascular Disease

## 2017-03-12 ENCOUNTER — Ambulatory Visit (INDEPENDENT_AMBULATORY_CARE_PROVIDER_SITE_OTHER): Payer: Self-pay | Admitting: Cardiovascular Disease

## 2017-03-12 VITALS — BP 122/80 | HR 67 | Ht 71.0 in | Wt 214.0 lb

## 2017-03-12 DIAGNOSIS — I480 Paroxysmal atrial fibrillation: Secondary | ICD-10-CM

## 2017-03-12 DIAGNOSIS — F17200 Nicotine dependence, unspecified, uncomplicated: Secondary | ICD-10-CM

## 2017-03-12 DIAGNOSIS — R002 Palpitations: Secondary | ICD-10-CM

## 2017-03-12 DIAGNOSIS — I10 Essential (primary) hypertension: Secondary | ICD-10-CM

## 2017-03-12 DIAGNOSIS — I4892 Unspecified atrial flutter: Secondary | ICD-10-CM

## 2017-03-12 DIAGNOSIS — M79604 Pain in right leg: Secondary | ICD-10-CM

## 2017-03-12 DIAGNOSIS — E7800 Pure hypercholesterolemia, unspecified: Secondary | ICD-10-CM

## 2017-03-12 DIAGNOSIS — M79605 Pain in left leg: Secondary | ICD-10-CM

## 2017-03-12 DIAGNOSIS — Z79899 Other long term (current) drug therapy: Secondary | ICD-10-CM

## 2017-03-12 DIAGNOSIS — I25118 Atherosclerotic heart disease of native coronary artery with other forms of angina pectoris: Secondary | ICD-10-CM

## 2017-03-12 NOTE — Patient Instructions (Addendum)
Medication Instructions:  Continue all current medications.  Labwork: none  Testing/Procedures:  Your physician has requested that you have an ankle brachial index (ABI). During this test an ultrasound and blood pressure cuff are used to evaluate the arteries that supply the arms and legs with blood. Allow thirty minutes for this exam. There are no restrictions or special instructions.  Office will contact with results via phone or letter.    Follow-Up: As needed with Dr. Bronson Ing.  Any Other Special Instructions Will Be Listed Below (If Applicable). Follow up with Dr. Rayann Heman for atrial flutter.    If you need a refill on your cardiac medications before your next appointment, please call your pharmacy.

## 2017-03-12 NOTE — Progress Notes (Signed)
SUBJECTIVE: The patient presents for routine follow-up. He has atrial fibrillation and atypical atrial flutter but does not want to consider ablation. He is on flecainide and Xarelto. He has a history of panic attacks.  He has nonobstructive CAD. Specifically, the distal left circumflex had a 65% stenosis , the mid to distal LAD had a 20% stenosis, and the proximal to distal RCA had a 20% stenosis. No lesions were hemodynamically significant.  Echocardiography on 08/12/15 demonstrated normal left ventricular systolic function and regional wall motion, LVEF 60-65%.  He complains of bilateral leg pain, right greater than left. He smokes a half pack of cigarettes daily. He has calf pain when he rests and when he walks. He does not notice if it improves when he is bending over pushing a shopping cart.  He also complains of debilitating palpitations. He said they wake him up at night. He has tried altering his diet but this has not helped alleviate his symptoms. He said his symptoms are different than his panic attacks.   Review of Systems: As per "subjective", otherwise negative.  Allergies  Allergen Reactions  . Dexilant [Dexlansoprazole] Anaphylaxis  . Mushroom Ext Cmplx(Shiitake-Reishi-Mait) Anaphylaxis    Rapid heart rate.  . Penicillins Anaphylaxis    Has patient had a PCN reaction causing immediate rash, facial/tongue/throat swelling, SOB or lightheadedness with hypotension: Yes Has patient had a PCN reaction causing severe rash involving mucus membranes or skin necrosis: No Has patient had a PCN reaction that required hospitalization Yes Has patient had a PCN reaction occurring within the last 10 years: No If all of the above answers are "NO", then may proceed with Cephalosporin use.   [name] Doxycycline     Current Outpatient Prescriptions  Medication Sig Dispense Refill  . acetaminophen (TYLENOL) 325 MG tablet Take 650 mg by mouth every 6 (six) hours as needed for pain or  fever.     [name] albuterol (VENTOLIN HFA) 108 (90 BASE) MCG/ACT inhaler Inhale 2 puffs into the lungs every 6 (six) hours as needed for wheezing or shortness of breath.     . ALPRAZolam (XANAX) 0.5 MG tablet Take one tablet by mouth three times a day and two tablets at night 150 tablet 2  . amLODipine (NORVASC) 10 MG tablet Take 10 mg by mouth daily.    . budesonide-formoterol (SYMBICORT) 80-4.5 MCG/ACT inhaler Inhale 2 puffs into the lungs 2 (two) times daily as needed.     . calcium carbonate (TUMS - DOSED IN MG ELEMENTAL CALCIUM) 500 MG chewable tablet Chew 1 tablet by mouth daily as needed for indigestion or heartburn.    . cyclobenzaprine (FLEXERIL) 10 MG tablet Take 1 tablet (10 mg total) by mouth 3 (three) times daily. (Patient taking differently: Take 10 mg by mouth 3 (three) times daily as needed for muscle spasms. ) 21 tablet 0  . esomeprazole (NEXIUM) 20 MG capsule Take 40 mg by mouth at bedtime.    . flecainide (TAMBOCOR) 100 MG tablet TAKE 1 TABLET BY MOUTH TWICE DAILY 60 tablet 3  . HYDROcodone-acetaminophen (NORCO/VICODIN) 5-325 MG tablet Take one to two tablets every 4-6 hours as needed for pain 40 tablet 0  . lovastatin (MEVACOR) 20 MG tablet TAKE ONE TABLET BY MOUTH ONCE DAILY AT 6:OO PM 90 tablet 1  . metFORMIN (GLUCOPHAGE) 500 MG tablet Take 500 mg by mouth daily.    . metoprolol succinate (TOPROL-XL) 50 MG 24 hr tablet Take 75 mg by mouth daily. Take with or  immediately following a meal.    . nitroGLYCERIN (NITROSTAT) 0.4 MG SL tablet Place 1 tablet (0.4 mg total) under the tongue every 5 (five) minutes as needed for chest pain. 25 tablet 3  . XARELTO 20 MG TABS tablet TAKE 1 Tablet BY MOUTH ONCE DAILY AT DINNER  3  . traZODone (DESYREL) 50 MG tablet Take 1 tablet (50 mg total) by mouth at bedtime. (Patient not taking: Reported on 03/12/2017) 30 tablet 2   No current facility-administered medications for this visit.     Past Medical History:  Diagnosis Date  . Arthritis     "legs, spine, shoulders" (08/12/2015)  . Asthma   . Chest pain    + palpitations; cath 2005- 30-40% mid LAD, 20% D1, 20% cx, OM, 20-30% RCA, and EF-55%  . Colitis 1990  . COPD (chronic obstructive pulmonary disease) ([place])   . Depression   . Dysrhythmia    AFib  . Gastric ulcer 2003; 2012   2003: + esophagitis; negative H.pylori serology  2012: Dr. Oneida Alar, mild gastritis, Bravo PH probe placement, negative H.pylori  . GERD (gastroesophageal reflux disease)   . Hepatic steatosis   . History of hiatal hernia   . Hyperlipemia   . Hypertension   . Overweight   . Panic attacks   . Paroxysmal atrial fibrillation (HCC)   . Stroke [facility])    mucsle in left side of face dosent work as its supposed to-only deficit.  [name] TIA (transient ischemic attack)    after cath 1/ 17  . Tobacco abuse    1/2 pack per day  . Type II diabetes mellitus ([place])     Past Surgical History:  Procedure Laterality Date  . BRAVO [place] STUDY  05/03/2011   NID:POEU gastritis/normal esophagus and duodenum  . CARDIAC CATHETERIZATION  1990s X 1; 2005; 08/12/2015  . CARDIAC CATHETERIZATION N/A 08/12/2015   Procedure: Left Heart Cath and Coronary Angiography;  Surgeon: Belva Crome, MD; LAD 20%, CFX 65%, RCA 20%, EF 60%   . COLONOSCOPY  1990  . COLONOSCOPY WITH PROPOFOL N/A 11/21/2016   Procedure: COLONOSCOPY WITH PROPOFOL;  Surgeon: [name], MD;  [facility];  Service: Endoscopy;  Laterality: N/A;  7:30AM  . ESOPHAGOGASTRODUODENOSCOPY  05/03/2011   [id number] gastritis  . NECK MASS EXCISION Right    "done in dr's office; behind right ear/side of ncek"  . POLYPECTOMY  11/21/2016   Procedure: POLYPECTOMY;  Surgeon: [name], MD;  Location[facility];  Service: Endoscopy;;  descending colon polyp  . SHOULDER ARTHROSCOPY W/ ROTATOR CUFF REPAIR Right 2006   acromioclavicular joint arthrosis    Social History   Social History  . Marital status: Married    Spouse name: N/A  . Number of children:  N/A  . Years of education: N/A   Occupational History  . full time Unemployed   Social History Main Topics  . Smoking status: Current Every Day Smoker    Packs/day: 0.50    Years: 25.00    Types: Cigarettes    Start date: 07/17/1982  . Smokeless tobacco: Never Used  . Alcohol use No  . Drug use: No  . Sexual activity: Yes    Birth control/ protection: None   Other Topics Concern  . Not on file   Social History Narrative   Pt lives in [place] with wife.  5 children.  Unemployed due to panic attacks and back pain     Vitals:   03/12/17  1510  BP: 122/80  Pulse: 67  SpO2: 95%  Weight: 214 lb (97.1 kg)  Height: 5\' 11"  (1.803 m)    Wt Readings from Last 3 Encounters:  03/12/17 214 lb (97.1 kg)  01/04/17 214 lb (97.1 kg)  11/21/16 213 lb (96.6 kg)     PHYSICAL EXAM General: NAD HEENT: Normal. Neck: No JVD, no thyromegaly. Lungs: Clear to auscultation bilaterally with normal respiratory effort. CV: Nondisplaced PMI.  Regular rate and rhythm, normal S1/S2, no S3/S4, no murmur. No pretibial or periankle edema.  No carotid bruit.   Abdomen: Soft, nontender, no distention.  Neurologic: Alert and oriented.  Psych: Normal affect. Skin: Normal. Musculoskeletal: No gross deformities.    ECG: Most recent ECG reviewed.   Labs: Lab Results  Component Value Date/Time   K 3.7 11/16/2016 03:11 PM   K 4.3 10/23/2012   BUN 14 11/16/2016 03:11 PM   BUN 15 10/23/2012   CREATININE 0.92 11/16/2016 03:11 PM   CREATININE 0.85 08/03/2015 12:30 PM   ALT 25 08/31/2015 03:54 PM   TSH 0.958 04/21/2011 02:46 PM   HGB 15.9 11/16/2016 03:11 PM     Lipids: Lab Results  Component Value Date/Time   LDLCALC 64 08/13/2015 05:10 AM   CHOL 119 08/13/2015 05:10 AM   TRIG 142 08/13/2015 05:10 AM   HDL 27 (L) 08/13/2015 05:10 AM       ASSESSMENT AND PLAN:  1. Nonobstructive CAD: Symptomatically stable. Cath results noted above. Continue ASA, metoprolol, and statin. No further  workup is indicated.  2. Essential HTN: Controlled on current therapy. No changes.  3. Paroxysmal atrial fibrillation and typical atrial flutter with debilitating palpitations: Previously said he did not want an ablation. Continue flecainide and Xarelto.  I will make an appt with Dr. Rayann Heman as he has changed his mind about ablation.  4. Hyperlipidemia: Continue statin.  5. Bilateral leg pain: Given his tobacco abuse, he is at high risk for peripheral arterial disease. I will obtain ABIs.   Disposition: Follow up prn with me. FU with Dr. Rayann Heman.   Kate Sable, M.D., F.A.C.C.

## 2017-03-21 ENCOUNTER — Telehealth: Payer: Self-pay | Admitting: *Deleted

## 2017-03-21 ENCOUNTER — Other Ambulatory Visit: Payer: Self-pay | Admitting: Cardiovascular Disease

## 2017-03-21 ENCOUNTER — Other Ambulatory Visit: Payer: Self-pay | Admitting: *Deleted

## 2017-03-21 DIAGNOSIS — I739 Peripheral vascular disease, unspecified: Secondary | ICD-10-CM

## 2017-03-21 MED ORDER — XARELTO 20 MG PO TABS: 20.0000 mg | ORAL_TABLET | Freq: Every day | ORAL | 3 refills | Status: DC

## 2017-03-21 MED ORDER — AMLODIPINE BESYLATE 5 MG PO TABS: 5.0000 mg | ORAL_TABLET | Freq: Every day | ORAL | 0 refills | Status: DC

## 2017-03-21 NOTE — Telephone Encounter (Signed)
Patient is requesting refill on the Amlodipine 5mg  daily.  Our medication list states Amlodipine 10mg  daily.  Stated that his lung doctor changed it over a year ago due to an inhaler that he was putting him on.  Has not seen lung doctor recently.  Would like to see if Dr. Bronson Ing will give refill.  Usually gets meds in the mail from [facility], but had issues with paper work & has delayed them coming in.  Just needs a few to cover him till others come in the mail.  Please advise if okay to fill the 5mg  tablet.

## 2017-03-21 NOTE — Telephone Encounter (Signed)
That would be fine 

## 2017-03-21 NOTE — Telephone Encounter (Signed)
Patient notified.  Amlodipine 5mg  #30 tabs sent to [facility] now.

## 2017-03-23 ENCOUNTER — Ambulatory Visit (INDEPENDENT_AMBULATORY_CARE_PROVIDER_SITE_OTHER): Payer: Self-pay | Admitting: Internal Medicine

## 2017-03-23 ENCOUNTER — Encounter: Payer: Self-pay | Admitting: Internal Medicine

## 2017-03-23 VITALS — BP 134/70 | HR 65 | Ht 71.0 in | Wt 211.0 lb

## 2017-03-23 DIAGNOSIS — F17200 Nicotine dependence, unspecified, uncomplicated: Secondary | ICD-10-CM

## 2017-03-23 DIAGNOSIS — I480 Paroxysmal atrial fibrillation: Secondary | ICD-10-CM

## 2017-03-23 DIAGNOSIS — I10 Essential (primary) hypertension: Secondary | ICD-10-CM

## 2017-03-23 NOTE — Patient Instructions (Addendum)
Medication Instructions:   Your physician recommends that you continue on your current medications as directed. Please refer to the Current Medication list given to you today.  Labwork:  NONE  Testing/Procedures: Your physician has recommended that you have an ablation. Catheter ablation is a medical procedure used to treat some cardiac arrhythmias (irregular heartbeats). During catheter ablation, a long, thin, flexible tube is put into a blood vessel in your groin (upper thigh), or neck. This tube is called an ablation catheter. It is then guided to your heart through the blood vessel. Radio frequency waves destroy small areas of heart tissue where abnormal heartbeats may cause an arrhythmia to start. Please see the instruction sheet given to you today. You will be contacted about this procedure with the date, time and instructions.  Follow-Up:  Your physician recommends that you schedule a follow-up appointment in: pending after ablation.  Any Other Special Instructions Will Be Listed Below (If Applicable).  If you need a refill on your cardiac medications before your next appointment, please call your pharmacy.

## 2017-03-23 NOTE — Progress Notes (Signed)
PCP: Raiford Simmonds., PA-C Primary Cardiologist: Dr Bronson Ing Primary EP: Dr Rayann Heman  Richard [name] is a 51 y.o. male who presents today for routine electrophysiology followup.  Since last being seen in our clinic, the patient reports doing reasonably well.  He has afib most every day.  These episodes are prominent at night, when lying on his left side.  He feels "washed out" afterwards and also has trouble sleeping.  He finds this frustrating.  He also has chronic back issues and frequent diaphoresis when in heat.  He says he cant work and is considering disability.  Today, he denies symptoms of  chest pain, shortness of breath,  lower extremity edema, dizziness, presyncope, or syncope.  The patient is otherwise without complaint today.   Past Medical History:  Diagnosis Date  . Arthritis    "legs, spine, shoulders" (08/12/2015)  . Asthma   . Chest pain    + palpitations; cath 2005- 30-40% mid LAD, 20% D1, 20% cx, OM, 20-30% RCA, and EF-55%  . Colitis 1990  . COPD (chronic obstructive pulmonary disease) ([place])   . Depression   . Dysrhythmia    AFib  . Gastric ulcer 2003; 2012   2003: + esophagitis; negative H.pylori serology  2012: Dr. Oneida Alar, mild gastritis, Bravo PH probe placement, negative H.pylori  . GERD (gastroesophageal reflux disease)   . Hepatic steatosis   . History of hiatal hernia   . Hyperlipemia   . Hypertension   . Overweight   . Panic attacks   . Paroxysmal atrial fibrillation (HCC)   . Stroke [facility])    mucsle in left side of face dosent work as its supposed to-only deficit.  [name] TIA (transient ischemic attack)    after cath 1/ 17  . Tobacco abuse    1/2 pack per day  . Type II diabetes mellitus ([place])    Past Surgical History:  Procedure Laterality Date  . BRAVO [place] STUDY  05/03/2011   [id number] gastritis/normal esophagus and duodenum  . CARDIAC CATHETERIZATION  1990s X 1; 2005; 08/12/2015  . CARDIAC CATHETERIZATION N/A 08/12/2015   Procedure: Left Heart Cath  and Coronary Angiography;  Surgeon: Belva Crome, MD; LAD 20%, CFX 65%, RCA 20%, EF 60%   . COLONOSCOPY  1990  . COLONOSCOPY WITH PROPOFOL N/A 11/21/2016   Procedure: COLONOSCOPY WITH PROPOFOL;  Surgeon: [name], MD;  [facility];  Service: Endoscopy;  Laterality: N/A;  7:30AM  . ESOPHAGOGASTRODUODENOSCOPY  05/03/2011   [id number] gastritis  . NECK MASS EXCISION Right    "done in dr's office; behind right ear/side of ncek"  . POLYPECTOMY  11/21/2016   Procedure: POLYPECTOMY;  Surgeon: [name], MD;  [facility];  Service: Endoscopy;;  descending colon polyp  . SHOULDER ARTHROSCOPY W/ ROTATOR CUFF REPAIR Right 2006   acromioclavicular joint arthrosis    ROS- all systems are reviewed and negatives except as per HPI above  Current Outpatient Prescriptions  Medication Sig Dispense Refill  . acetaminophen (TYLENOL) 325 MG tablet Take 650 mg by mouth every 6 (six) hours as needed for pain or fever.     [name] albuterol (VENTOLIN HFA) 108 (90 BASE) MCG/ACT inhaler Inhale 2 puffs into the lungs every 6 (six) hours as needed for wheezing or shortness of breath.     . ALPRAZolam (XANAX) 0.5 MG tablet Take one tablet by mouth three times a day and two tablets at night 150 tablet 2  . amLODipine (NORVASC) 5  MG tablet Take 1 tablet (5 mg total) by mouth daily. 30 tablet 0  . budesonide-formoterol (SYMBICORT) 80-4.5 MCG/ACT inhaler Inhale 2 puffs into the lungs 2 (two) times daily as needed.     . calcium carbonate (TUMS - DOSED IN MG ELEMENTAL CALCIUM) 500 MG chewable tablet Chew 1 tablet by mouth daily as needed for indigestion or heartburn.    . cyclobenzaprine (FLEXERIL) 10 MG tablet Take 1 tablet (10 mg total) by mouth 3 (three) times daily. (Patient taking differently: Take 10 mg by mouth 3 (three) times daily as needed for muscle spasms. ) 21 tablet 0  . esomeprazole (NEXIUM) 20 MG capsule Take 40 mg by mouth at bedtime.    . flecainide (TAMBOCOR) 100 MG tablet TAKE 1  TABLET BY MOUTH TWICE DAILY 60 tablet 3  . HYDROcodone-acetaminophen (NORCO/VICODIN) 5-325 MG tablet Take one to two tablets every 4-6 hours as needed for pain 40 tablet 0  . lovastatin (MEVACOR) 20 MG tablet TAKE ONE TABLET BY MOUTH ONCE DAILY AT 6:OO PM 90 tablet 1  . metFORMIN (GLUCOPHAGE) 500 MG tablet Take 500 mg by mouth daily.    . metoprolol succinate (TOPROL-XL) 50 MG 24 hr tablet Take 75 mg by mouth daily. Take with or immediately following a meal.    . nitroGLYCERIN (NITROSTAT) 0.4 MG SL tablet Place 1 tablet (0.4 mg total) under the tongue every 5 (five) minutes as needed for chest pain. 25 tablet 3  . traZODone (DESYREL) 50 MG tablet Take 1 tablet (50 mg total) by mouth at bedtime. 30 tablet 2  . XARELTO 20 MG TABS tablet Take 1 tablet (20 mg total) by mouth daily with supper. 90 tablet 3   No current facility-administered medications for this visit.     Physical Exam: Vitals:   03/23/17 0957  BP: 134/70  Pulse: 65  SpO2: 98%  Weight: 211 lb (95.7 kg)  Height: 5\' 11"  (1.803 m)    GEN- The patient is well appearing, alert and oriented x 3 today.   Head- normocephalic, atraumatic Eyes-  Sclera clear, conjunctiva pink Ears- hearing intact Oropharynx- clear Lungs- Clear to ausculation bilaterally, normal work of breathing Heart- Regular rate and rhythm, no murmurs, rubs or gallops, PMI not laterally displaced GI- soft, NT, ND, + BS Extremities- no clubbing, cyanosis, or edema  EKG tracing ordered today is personally reviewed and shows sinus rhythm, nonspecific ST/T changes  Assessment and Plan:  1. Paroxysmal atrial fibrillation Very symptomatic He has failed medical therapy with flecainide and metoprolol chads2vasc score is 4.  He is on xarelto. Therapeutic strategies for afib including medicine and ablation were discussed in detail with the patient today. Risk, benefits, and alternatives to EP study and radiofrequency ablation for afib were also discussed in detail  today. These risks include but are not limited to stroke, bleeding, vascular damage, tamponade, perforation, damage to the esophagus, lungs, and other structures, pulmonary vein stenosis, worsening renal function, and death. The patient understands these risk and wishes to proceed.   He has no insurance and is working on [employer].  We will therefore proceed with catheter ablation once the patient has arranged for his financial assistance (per his preference)  2. HTN Stable No change required today  3. Tobacco Cessation is advised  Thompson Grayer MD, [facility] 03/23/2017 10:20 AM

## 2017-03-27 ENCOUNTER — Other Ambulatory Visit: Payer: Self-pay | Admitting: Cardiology

## 2017-03-29 ENCOUNTER — Ambulatory Visit: Payer: Self-pay

## 2017-03-29 ENCOUNTER — Other Ambulatory Visit: Payer: Self-pay

## 2017-03-29 ENCOUNTER — Other Ambulatory Visit: Payer: Self-pay | Admitting: Cardiovascular Disease

## 2017-03-29 DIAGNOSIS — I739 Peripheral vascular disease, unspecified: Secondary | ICD-10-CM

## 2017-03-29 DIAGNOSIS — R202 Paresthesia of skin: Secondary | ICD-10-CM

## 2017-03-29 MED ORDER — XARELTO 20 MG PO TABS: 20.0000 mg | ORAL_TABLET | Freq: Every day | ORAL | 0 refills | Status: DC

## 2017-04-02 ENCOUNTER — Telehealth: Payer: Self-pay | Admitting: *Deleted

## 2017-04-02 NOTE — Telephone Encounter (Signed)
Notes recorded by Laurine Blazer, LPN on 9/89/2119 at 41:74 AM EDT [name] (wife) notified. Copy to pmd. ------  Notes recorded by Laurine Blazer, LPN on 0/81/4481 at 8:56 AM EDT Left message to return call.  ------  Notes recorded by Herminio Commons, MD on 03/30/2017 at 3:49 PM EDT Normal.

## 2017-04-03 ENCOUNTER — Ambulatory Visit (HOSPITAL_COMMUNITY): Payer: Self-pay | Admitting: Psychiatry

## 2017-05-01 ENCOUNTER — Ambulatory Visit: Payer: Self-pay | Admitting: Physician Assistant

## 2017-05-03 ENCOUNTER — Ambulatory Visit: Payer: Self-pay | Admitting: Physician Assistant

## 2017-05-07 ENCOUNTER — Ambulatory Visit: Payer: Self-pay | Admitting: Physician Assistant

## 2017-05-07 ENCOUNTER — Encounter: Payer: Self-pay | Admitting: Physician Assistant

## 2017-05-07 VITALS — BP 132/84 | HR 66 | Temp 97.9°F | Ht 71.0 in | Wt 213.0 lb

## 2017-05-07 DIAGNOSIS — M545 Low back pain: Secondary | ICD-10-CM

## 2017-05-07 DIAGNOSIS — I2510 Atherosclerotic heart disease of native coronary artery without angina pectoris: Secondary | ICD-10-CM

## 2017-05-07 DIAGNOSIS — IMO0002 Reserved for concepts with insufficient information to code with codable children: Secondary | ICD-10-CM

## 2017-05-07 DIAGNOSIS — Z125 Encounter for screening for malignant neoplasm of prostate: Secondary | ICD-10-CM

## 2017-05-07 DIAGNOSIS — M25521 Pain in right elbow: Secondary | ICD-10-CM

## 2017-05-07 DIAGNOSIS — F419 Anxiety disorder, unspecified: Secondary | ICD-10-CM

## 2017-05-07 DIAGNOSIS — F329 Major depressive disorder, single episode, unspecified: Secondary | ICD-10-CM

## 2017-05-07 DIAGNOSIS — Z0000 Encounter for general adult medical examination without abnormal findings: Secondary | ICD-10-CM

## 2017-05-07 DIAGNOSIS — E118 Type 2 diabetes mellitus with unspecified complications: Principal | ICD-10-CM

## 2017-05-07 DIAGNOSIS — J449 Chronic obstructive pulmonary disease, unspecified: Secondary | ICD-10-CM

## 2017-05-07 DIAGNOSIS — F32A Depression, unspecified: Secondary | ICD-10-CM

## 2017-05-07 DIAGNOSIS — I480 Paroxysmal atrial fibrillation: Secondary | ICD-10-CM

## 2017-05-07 DIAGNOSIS — E1165 Type 2 diabetes mellitus with hyperglycemia: Secondary | ICD-10-CM

## 2017-05-07 DIAGNOSIS — F17219 Nicotine dependence, cigarettes, with unspecified nicotine-induced disorders: Secondary | ICD-10-CM

## 2017-05-07 DIAGNOSIS — E785 Hyperlipidemia, unspecified: Secondary | ICD-10-CM

## 2017-05-07 DIAGNOSIS — G8929 Other chronic pain: Secondary | ICD-10-CM

## 2017-05-07 NOTE — Progress Notes (Signed)
BP 132/84 (BP Location: Left Arm, Patient Position: Sitting, Cuff Size: Large)   Pulse 66   Temp 97.9 F (36.6 C) (Other (Comment))   Ht 5\' 11"  (1.803 m)   Wt 213 lb (96.6 kg)   SpO2 98%   BMI 29.71 kg/m    Subjective:    Patient ID: Richard [name], male    DOB: [birth year], 51 y.o.   MRN: [id number]  HPI: Richard [name] is a 51 y.o. male presenting on 05/07/2017 for New Patient (Initial Visit) (new/old pt)   HPI  Pt was last seen here 07/21/2014.  He went to [facility] after that time.    Pt went to cardiology in september for afib which is symptomatic.  He is planning for ablation once he gets his cone discount in place.     PMH:   Noncompliant Smoker DM HTN, Hyperlipidemia  Pt is going to [facility] for [place] issues.  Pt due for labs November.  He says he "wants a few days" so he can "get off his [facility]".   Last eye exam "a while ago"- at [facility] in [place].   Pt complains of R elbow pain for about a month. No injury. He says it is related to previous problems he has had with his R shoulder.   Relevant past medical, surgical, family and social history reviewed and updated as indicated. Interim medical history since our last visit reviewed. Allergies and medications reviewed and updated.   Current Outpatient Prescriptions:  .  acetaminophen (TYLENOL) 325 MG tablet, Take 650 mg by mouth every 6 (six) hours as needed for pain or fever. , Disp: , Rfl:  .  albuterol (VENTOLIN HFA) 108 (90 BASE) MCG/ACT inhaler, Inhale 2 puffs into the lungs every 6 (six) hours as needed for wheezing or shortness of breath. , Disp: , Rfl:  .  ALPRAZolam (XANAX) 0.5 MG tablet, Take one tablet by mouth three times a day and two tablets at night, Disp: 150 tablet, Rfl: 2 .  amLODipine (NORVASC) 5 MG tablet, Take 1 tablet (5 mg total) by mouth daily., Disp: 30 tablet, Rfl: 0 .  budesonide-formoterol (SYMBICORT) 80-4.5 MCG/ACT inhaler, Inhale 2 puffs into the lungs 2 (two) times daily as  needed. , Disp: , Rfl:  .  calcium carbonate (TUMS - DOSED IN MG ELEMENTAL CALCIUM) 500 MG chewable tablet, Chew 1 tablet by mouth daily as needed for indigestion or heartburn., Disp: , Rfl:  .  cyclobenzaprine (FLEXERIL) 10 MG tablet, Take 1 tablet (10 mg total) by mouth 3 (three) times daily. (Patient taking differently: Take 10 mg by mouth 3 (three) times daily as needed for muscle spasms. ), Disp: 21 tablet, Rfl: 0 .  esomeprazole (NEXIUM) 20 MG capsule, Take 40 mg by mouth at bedtime., Disp: , Rfl:  .  flecainide (TAMBOCOR) 100 MG tablet, TAKE 1 TABLET BY MOUTH TWICE DAILY, Disp: 60 tablet, Rfl: 3 .  HYDROcodone-acetaminophen (NORCO/VICODIN) 5-325 MG tablet, Take one to two tablets every 4-6 hours as needed for pain, Disp: 40 tablet, Rfl: 0 .  lovastatin (MEVACOR) 20 MG tablet, TAKE ONE TABLET BY MOUTH ONCE DAILY AT 6:OO PM, Disp: 90 tablet, Rfl: 1 .  metFORMIN (GLUCOPHAGE) 500 MG tablet, Take 500 mg by mouth daily., Disp: , Rfl:  .  metoprolol succinate (TOPROL-XL) 50 MG 24 hr tablet, Take 75 mg by mouth daily. Take with or immediately following a meal., Disp: , Rfl:  .  metoprolol succinate (TOPROL-XL) 50 MG 24 hr  tablet, TAKE 1 & 1/2 (ONE & ONE-HALF) TABLETS BY MOUTH ONCE DAILY, Disp: 135 tablet, Rfl: 3 .  nitroGLYCERIN (NITROSTAT) 0.4 MG SL tablet, Place 1 tablet (0.4 mg total) under the tongue every 5 (five) minutes as needed for chest pain., Disp: 25 tablet, Rfl: 3 .  XARELTO 20 MG TABS tablet, Take 1 tablet (20 mg total) by mouth daily with supper., Disp: 28 tablet, Rfl: 0 .  traZODone (DESYREL) 50 MG tablet, Take 1 tablet (50 mg total) by mouth at bedtime. (Patient not taking: Reported on 05/07/2017), Disp: 30 tablet, Rfl: 2   Review of Systems  Constitutional: Positive for diaphoresis and fatigue. Negative for appetite change, chills, fever and unexpected weight change.  HENT: Positive for congestion, dental problem and sneezing. Negative for drooling, ear pain, facial swelling,  hearing loss, mouth sores, sore throat, trouble swallowing and voice change.   Eyes: Positive for redness, itching and visual disturbance. Negative for pain and discharge.  Respiratory: Positive for cough, chest tightness, shortness of breath and wheezing. Negative for choking.   Cardiovascular: Positive for palpitations. Negative for chest pain and leg swelling.  Gastrointestinal: Negative for abdominal pain, blood in stool, constipation, diarrhea and vomiting.  Endocrine: Positive for heat intolerance and polydipsia. Negative for cold intolerance.  Genitourinary: Negative for decreased urine volume, dysuria and hematuria.  Musculoskeletal: Positive for arthralgias, back pain and gait problem.  Skin: Negative for rash.  Allergic/Immunologic: Positive for environmental allergies.  Neurological: Positive for light-headedness and headaches. Negative for seizures and syncope.  Hematological: Negative for adenopathy.  Psychiatric/Behavioral: Positive for agitation and dysphoric mood. Negative for suicidal ideas. The patient is nervous/anxious.     Per HPI unless specifically indicated above     Objective:    BP 132/84 (BP Location: Left Arm, Patient Position: Sitting, Cuff Size: Large)   Pulse 66   Temp 97.9 F (36.6 C) (Other (Comment))   Ht 5\' 11"  (1.803 m)   Wt 213 lb (96.6 kg)   SpO2 98%   BMI 29.71 kg/m   Wt Readings from Last 3 Encounters:  05/07/17 213 lb (96.6 kg)  03/23/17 211 lb (95.7 kg)  03/12/17 214 lb (97.1 kg)    Physical Exam  Constitutional: He is oriented to person, place, and time. He appears well-developed and well-nourished.  HENT:  Head: Normocephalic and atraumatic.  Mouth/Throat: Oropharynx is clear and moist. No oropharyngeal exudate.  Eyes: Pupils are equal, round, and reactive to light. Conjunctivae and EOM are normal.  Neck: Neck supple. No thyromegaly present.  Cardiovascular: Normal rate and regular rhythm.   Pulmonary/Chest: Effort normal and  breath sounds normal. He has no wheezes. He has no rales.  Abdominal: Soft. Bowel sounds are normal. He exhibits no mass. There is no hepatosplenomegaly. There is no tenderness.  Musculoskeletal: He exhibits no edema.       Right elbow: He exhibits normal range of motion, no swelling, no effusion and no deformity. Tenderness found.  Lymphadenopathy:    He has no cervical adenopathy.  Neurological: He is alert and oriented to person, place, and time.  Skin: Skin is warm and dry. No rash noted.  Psychiatric: He has a normal mood and affect. His behavior is normal. Thought content normal.  Vitals reviewed.       Assessment & Plan:    Encounter Diagnoses  Name Primary?  [name] Uncontrolled diabetes mellitus with complications ([place]) Yes  . Paroxysmal atrial fibrillation (HCC)   . Arteriosclerotic cardiovascular disease (ASCVD)   . Chronic  obstructive pulmonary disease, unspecified COPD type ([place])   . Cigarette nicotine dependence with nicotine-induced disorder   . Hyperlipidemia, unspecified hyperlipidemia type   . Right elbow pain   . Screening for prostate cancer   . Chronic low back pain without sciatica, unspecified back pain laterality   . Anxiety and depression   . Encounter for medical examination to establish care      -Counseled to stop mt dew which is bad for his afib and his dm -Pt wants to go back to [facility] in [place] about elbow.  We can refer once he gets his cone discount in place -will get Baseline labs -counseled pt on Smoking cessation -Gave coupon for toprol-xl (he says he didn't tolerate immediate release metoprolol) -record request sent for last eye exam  -pt will contact financial counselor about cone discount application -pt will continue current medications -pt to follow up 1 month.  RTO sooner prn

## 2017-05-10 ENCOUNTER — Ambulatory Visit (INDEPENDENT_AMBULATORY_CARE_PROVIDER_SITE_OTHER): Payer: Self-pay | Admitting: Orthopaedic Surgery

## 2017-05-10 ENCOUNTER — Encounter (INDEPENDENT_AMBULATORY_CARE_PROVIDER_SITE_OTHER): Payer: Self-pay | Admitting: Orthopaedic Surgery

## 2017-05-10 ENCOUNTER — Ambulatory Visit (INDEPENDENT_AMBULATORY_CARE_PROVIDER_SITE_OTHER): Payer: Self-pay

## 2017-05-10 VITALS — BP 127/69 | HR 61

## 2017-05-10 DIAGNOSIS — M25521 Pain in right elbow: Secondary | ICD-10-CM

## 2017-05-10 DIAGNOSIS — M7711 Lateral epicondylitis, right elbow: Secondary | ICD-10-CM | POA: Insufficient documentation

## 2017-05-10 MED ORDER — METHYLPREDNISOLONE ACETATE 40 MG/ML IJ SUSP
40.0000 mg | INTRAMUSCULAR | Status: AC | PRN
  Administered 2017-05-10: 40 mg via INTRA_ARTICULAR

## 2017-05-10 MED ORDER — LIDOCAINE HCL 1 % IJ SOLN
1.0000 mL | INTRAMUSCULAR | Status: AC | PRN
  Administered 2017-05-10: 1 mL

## 2017-05-10 MED ORDER — BUPIVACAINE HCL 0.5 % IJ SOLN
1.0000 mL | INTRAMUSCULAR | Status: AC | PRN
  Administered 2017-05-10: 1 mL via INTRA_ARTICULAR

## 2017-05-10 NOTE — Progress Notes (Signed)
Office Visit Note   Patient: Richard [name]           Date of Birth: 12/09/1965           MRN: [id number] Visit Date: 05/10/2017              Requested by: [name], PA-C [address] [place], [place] [address] PCP: [name], PA-C   Assessment & Plan: Visit Diagnoses:  1. Pain in right elbow   2. Lateral epicondylitis, right elbow     Plan: L epicondylar injection performed tennis elbow strap applied we discussed pathophysiology of the condition and will try to alter some of his activities to decrease the load on the tendon. If he has persistent problems will call let us know.  Follow-Up Instructions: No Follow-up on file.   Orders:  Orders Placed This Encounter  Procedures  . Medium Joint Injection/Arthrocentesis  . XR Elbow 2 Views Right   No orders of the defined types were placed in this encounter.     Procedures: Medium Joint Inj Date/Time: 05/10/2017 11:30 AM Performed by: Marybelle Killings Authorized by: Marybelle Killings   Consent Given by:  Patient Indications:  Pain Location:  Elbow Site:  R lateral epicondyle Needle Size:  22 G Needle Length:  1.5 inches Approach:  Anterolateral Ultrasound Guided: No   Fluoroscopic Guidance: No   Medications:  1 mL lidocaine 1 %; 1 mL bupivacaine 0.5 %; 40 mg methylPREDNISolone acetate 40 MG/ML Aspiration Attempted: No   Patient tolerance:  Patient tolerated the procedure well with no immediate complications     Clinical Data: No additional findings.   Subjective: Chief Complaint  Patient presents with  . Right Elbow - Pain    HPI  51 year old male returns she states she still having some back pain but principal problem is been his right elbow. 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 He points exactly  lateral condyle with extreme pain. Pain with gripping. He denies associated neck pain. No chills or fever. Negative history for elbow trauma in the past.   Review of Systems  14 point review of systems updated and unchanged from 01/25/2018 office visit on an as mentioned in history of present illness.   Objective: Vital Signs: BP 127/69   Pulse 61   Physical Exam  Constitutional: He is oriented to person, place, and time. He appears well-developed and well-nourished.  HENT:  Head: Normocephalic and atraumatic.  Eyes: Pupils are equal, round, and reactive to light. EOM are normal.  Neck: No tracheal deviation present. No thyromegaly present.  Cardiovascular: Normal rate.   Pulmonary/Chest: Effort normal. He has no wheezes.  Abdominal: Soft. Bowel sounds are normal.  Neurological: He is alert and oriented to person, place, and time.  Skin: Skin is warm and dry. Capillary refill takes less than 2 seconds.  Psychiatric: He has a normal mood and affect. His behavior is normal. Judgment and thought content normal.    Ortho Exam patient has pain with gripping right arm. History of lumbar spine something straight leg raising difficulty getting from supine to sitting position due to back pain. Exquisite tenderness over the lateral epicondyle right elbow none on the left. Some tenderness at the triceps insertion site. Pain with resisted wrist extension and pain  with gripping at the lateral condyle. Median nerve forearm radial nerve is normal. No thenar or hyperthenar atrophy. Cervical range of motion is good.  Specialty Comments:  No specialty comments available.  Imaging: Xr Elbow 2 Views Right  Result Date: 05/10/2017 [facility] lateral right elbow x-rays obtained which demonstrates calcification of the triceps insertion site. No elbow osteoarthritis. Small spur at the lateral epicondyles origin suggestive of lateral epicondylitis chronically. Impression: Negative for acute trauma. Evidence of the  triceps insertion tendinopathy as well as lateral epicondylitis with tendon calcification    PMFS History: Patient Active Problem List   Diagnosis Date Noted  . Lateral epicondylitis, right elbow 05/10/2017  . Constipation 12/24/2015  . Dyspnea 11/17/2015  . Coronary artery disease due to lipid rich plaque   . Heart palpitations 08/12/2015  . TIA (transient ischemic attack) 08/12/2015  . Colon cancer screening 08/02/2015  . Abdominal pain 12/18/2014  . Encounter for screening colonoscopy 12/18/2014  . Unspecified vitamin D deficiency 08/20/2012  . Arteriosclerotic cardiovascular disease (ASCVD) 04/11/2012  . Chronic low back pain   . Essential hypertension   . Anxiety and depression   . DIABETES MELLITUS-TYPE II 10/03/2010  . Cigarette smoker 09/14/2010  . CHRONIC OBSTRUCTIVE PULMONARY DISEASE 09/14/2010  . HLD (hyperlipidemia) 11/25/2009  . GASTROESOPHAGEAL REFLUX DISEASE 04/05/2009  . Hepatic steatosis 04/05/2009   Past Medical History:  Diagnosis Date  . Arthritis    "legs, spine, shoulders" (08/12/2015)  . Asthma   . Chest pain    + palpitations; cath 2005- 30-40% mid LAD, 20% D1, 20% cx, OM, 20-30% RCA, and EF-55%  . Colitis 1990  . COPD (chronic obstructive pulmonary disease) ([place])   . Depression   . Dysrhythmia    AFib  . Gastric ulcer 2003; 2012   2003: + esophagitis; negative H.pylori serology  2012: Dr. Oneida Alar, mild gastritis, Bravo PH probe placement, negative H.pylori  . GERD (gastroesophageal reflux disease)   . Hepatic steatosis   . History of hiatal hernia   . Hyperlipemia   . Hypertension   . Overweight   . Panic attacks   . Paroxysmal atrial fibrillation (HCC)   . Stroke [facility])    mucsle in left side of face dosent work as its supposed to-only deficit.  [name] TIA (transient ischemic attack)    after cath 1/ 17  . Tobacco abuse    1/2 pack per day  . Type II diabetes mellitus (HCC)     Family History  Problem Relation Age of Onset  . Lung cancer  Mother   . Alcohol abuse Mother   . Heart attack Father 39  . Diabetes Father   . Alcohol abuse Father   . Hypertension Brother   . Hypertension Brother   . Anxiety disorder Sister   . Depression Sister   . Anxiety disorder Sister   . Heart attack Brother 59  . Diabetes Brother   . Hypertension Brother   . Seizures Brother   . Dementia Paternal Uncle   . Dementia Cousin   . ADD / ADHD Daughter   . Colon cancer Neg Hx   . Drug abuse Neg Hx   . Bipolar disorder Neg Hx   . OCD Neg Hx   . Paranoid behavior Neg Hx   . Schizophrenia Neg Hx   . Sexual abuse Neg Hx   . Physical abuse Neg Hx     Past Surgical History:  Procedure Laterality Date  . BRAVO [place] STUDY  05/03/2011   VFI:EPPI gastritis/normal  esophagus and duodenum  . CARDIAC CATHETERIZATION  1990s X 1; 2005; 08/12/2015  . CARDIAC CATHETERIZATION N/A 08/12/2015   Procedure: Left Heart Cath and Coronary Angiography;  Surgeon: Belva Crome, MD; LAD 20%, CFX 65%, RCA 20%, EF 60%   . COLONOSCOPY  1990  . COLONOSCOPY WITH PROPOFOL N/A 11/21/2016   Procedure: COLONOSCOPY WITH PROPOFOL;  Surgeon: [name], MD;  Location: [facility] ENDO SUITE;  Service: Endoscopy;  Laterality: N/A;  7:30AM  . ESOPHAGOGASTRODUODENOSCOPY  05/03/2011   UJW:[id number] gastritis  . NECK MASS EXCISION Right    "done in dr's office; behind right ear/side of ncek"  . POLYPECTOMY  11/21/2016   Procedure: POLYPECTOMY;  Surgeon: [name], MD;  Location: [facility] END[facility] SUITE;  Service: Endoscopy;;  descending colon polyp  . SHOULDER ARTHROSCOPY W/ ROTATOR CUFF REPAIR Right 2006   acromioclavicular joint arthrosis   Social History   Occupational History  . full time Unemployed   Social History Main Topics  . Smoking status: Current Every Day Smoker    Packs/day: 0.50    Years: 25.00    Types: Cigarettes    Start date: 07/17/1982  . Smokeless tobacco: Never Used  . Alcohol use No  . Drug use: No  . Sexual activity: Yes    Birth control/ protection: None

## 2017-05-27 ENCOUNTER — Other Ambulatory Visit: Payer: Self-pay | Admitting: Cardiovascular Disease

## 2017-05-29 ENCOUNTER — Other Ambulatory Visit (HOSPITAL_COMMUNITY)
Admission: RE | Admit: 2017-05-29 | Discharge: 2017-05-29 | Disposition: A | Discharge status: Home / Self Care | Payer: Self-pay | Source: Ambulatory Visit | Attending: Physician Assistant | Admitting: Physician Assistant

## 2017-05-29 DIAGNOSIS — E1165 Type 2 diabetes mellitus with hyperglycemia: Secondary | ICD-10-CM | POA: Insufficient documentation

## 2017-05-29 DIAGNOSIS — IMO0002 Reserved for concepts with insufficient information to code with codable children: Secondary | ICD-10-CM

## 2017-05-29 DIAGNOSIS — Z125 Encounter for screening for malignant neoplasm of prostate: Secondary | ICD-10-CM | POA: Insufficient documentation

## 2017-05-29 DIAGNOSIS — E785 Hyperlipidemia, unspecified: Secondary | ICD-10-CM | POA: Insufficient documentation

## 2017-05-29 DIAGNOSIS — E118 Type 2 diabetes mellitus with unspecified complications: Secondary | ICD-10-CM | POA: Insufficient documentation

## 2017-05-29 LAB — COMPREHENSIVE METABOLIC PANEL
ALBUMIN: 4.2 g/dL (ref 3.5–5.0)
ALK PHOS: 59 U/L (ref 38–126)
ALT: 51 U/L (ref 17–63)
AST: 30 U/L (ref 15–41)
Anion gap: 9 (ref 5–15)
BILIRUBIN TOTAL: 0.3 mg/dL (ref 0.3–1.2)
BUN: 17 mg/dL (ref 6–20)
CALCIUM: 9.2 mg/dL (ref 8.9–10.3)
CO2: 29 mmol/L (ref 22–32)
Chloride: 99 mmol/L — ABNORMAL LOW (ref 101–111)
Creatinine, Ser: 0.96 mg/dL (ref 0.61–1.24)
GFR calc Af Amer: >60 mL/min (ref >60)
GFR calc non Af Amer: >60 mL/min (ref >60)
GLUCOSE: 194 mg/dL — AB (ref 65–99)
Potassium: 3.4 mmol/L — ABNORMAL LOW (ref 3.5–5.1)
SODIUM: 137 mmol/L (ref 135–145)
TOTAL PROTEIN: 7.1 g/dL (ref 6.5–8.1)

## 2017-05-29 LAB — HEMOGLOBIN A1C
Hgb A1c MFr Bld: 8.8 % — ABNORMAL HIGH (ref 4.8–5.6)
Mean Plasma Glucose: 205.86 mg/dL

## 2017-05-29 LAB — LIPID PANEL
Cholesterol: 157 mg/dL (ref 0–200)
HDL: 32 mg/dL — AB (ref >40)
LDL Cholesterol: 91 mg/dL (ref 0–99)
Total CHOL/HDL Ratio: 4.9 RATIO
Triglycerides: 172 mg/dL — ABNORMAL HIGH (ref <150)
VLDL: 34 mg/dL (ref 0–40)

## 2017-05-29 LAB — PSA: Prostatic Specific Antigen: 0.53 ng/mL (ref 0.00–4.00)

## 2017-05-30 ENCOUNTER — Encounter: Payer: Self-pay | Admitting: Physician Assistant

## 2017-05-30 ENCOUNTER — Ambulatory Visit: Payer: Self-pay | Admitting: Physician Assistant

## 2017-05-30 VITALS — BP 136/80 | HR 64 | Ht 71.0 in | Wt 211.0 lb

## 2017-05-30 DIAGNOSIS — E785 Hyperlipidemia, unspecified: Secondary | ICD-10-CM

## 2017-05-30 DIAGNOSIS — M545 Low back pain, unspecified: Secondary | ICD-10-CM

## 2017-05-30 DIAGNOSIS — I2510 Atherosclerotic heart disease of native coronary artery without angina pectoris: Secondary | ICD-10-CM

## 2017-05-30 DIAGNOSIS — G8929 Other chronic pain: Secondary | ICD-10-CM

## 2017-05-30 DIAGNOSIS — F32A Depression, unspecified: Secondary | ICD-10-CM

## 2017-05-30 DIAGNOSIS — IMO0002 Reserved for concepts with insufficient information to code with codable children: Secondary | ICD-10-CM

## 2017-05-30 DIAGNOSIS — F419 Anxiety disorder, unspecified: Secondary | ICD-10-CM

## 2017-05-30 DIAGNOSIS — F329 Major depressive disorder, single episode, unspecified: Secondary | ICD-10-CM

## 2017-05-30 DIAGNOSIS — E1165 Type 2 diabetes mellitus with hyperglycemia: Secondary | ICD-10-CM

## 2017-05-30 DIAGNOSIS — E118 Type 2 diabetes mellitus with unspecified complications: Principal | ICD-10-CM

## 2017-05-30 DIAGNOSIS — I10 Essential (primary) hypertension: Secondary | ICD-10-CM

## 2017-05-30 DIAGNOSIS — I480 Paroxysmal atrial fibrillation: Secondary | ICD-10-CM

## 2017-05-30 DIAGNOSIS — J449 Chronic obstructive pulmonary disease, unspecified: Secondary | ICD-10-CM

## 2017-05-30 DIAGNOSIS — E876 Hypokalemia: Secondary | ICD-10-CM

## 2017-05-30 DIAGNOSIS — F17219 Nicotine dependence, cigarettes, with unspecified nicotine-induced disorders: Secondary | ICD-10-CM

## 2017-05-30 LAB — MICROALBUMIN, URINE: MICROALB UR: 38.7 ug/mL — AB

## 2017-05-30 MED ORDER — SIMVASTATIN 20 MG PO TABS: 20.0000 mg | ORAL_TABLET | Freq: Every day | ORAL | 4 refills | Status: DC

## 2017-05-30 NOTE — Progress Notes (Signed)
BP 136/80 (BP Location: Left Arm, Patient Position: Sitting, Cuff Size: Large)   Pulse 64   Ht 5\' 11"  (1.803 m)   Wt 211 lb (95.7 kg)   SpO2 98%   BMI 29.43 kg/m    Subjective:    Patient ID: Richard [name], male    DOB: September 13, 1965, 51 y.o.   MRN: [id number]  HPI: Richard [name] is a 51 y.o. male presenting on 05/30/2017 for Diabetes and Hyperlipidemia   HPI   Pt states insomnia.   Pt says he got approved for his cone discount.   Pt c/o burning pain up throat the past few days.  It lasts about an hour. It comes and goes.   He says it is worse at night when he lays down.   Pt saw cardiologist a few months ago and discussed ablation but it wasn't scheduled at that time as pt was waiting for cone discount  Relevant past medical, surgical, family and social history reviewed and updated as indicated. Interim medical history since our last visit reviewed. Allergies and medications reviewed and updated.   Current Outpatient Medications:  .  acetaminophen (TYLENOL) 325 MG tablet, Take 650 mg by mouth every 6 (six) hours as needed for pain or fever. , Disp: , Rfl:  .  albuterol (VENTOLIN HFA) 108 (90 BASE) MCG/ACT inhaler, Inhale 2 puffs into the lungs every 6 (six) hours as needed for wheezing or shortness of breath. , Disp: , Rfl:  .  ALPRAZolam (XANAX) 0.5 MG tablet, Take one tablet by mouth three times a day and two tablets at night, Disp: 150 tablet, Rfl: 2 .  amLODipine (NORVASC) 5 MG tablet, Take 1 tablet (5 mg total) by mouth daily., Disp: 30 tablet, Rfl: 0 .  budesonide-formoterol (SYMBICORT) 80-4.5 MCG/ACT inhaler, Inhale 2 puffs into the lungs 2 (two) times daily as needed. , Disp: , Rfl:  .  calcium carbonate (TUMS - DOSED IN MG ELEMENTAL CALCIUM) 500 MG chewable tablet, Chew 1 tablet by mouth daily as needed for indigestion or heartburn., Disp: , Rfl:  .  cyclobenzaprine (FLEXERIL) 10 MG tablet, Take 1 tablet (10 mg total) by mouth 3 (three) times daily. (Patient taking  differently: Take 10 mg by mouth 3 (three) times daily as needed for muscle spasms. ), Disp: 21 tablet, Rfl: 0 .  esomeprazole (NEXIUM) 20 MG capsule, Take 40 mg by mouth at bedtime., Disp: , Rfl:  .  flecainide (TAMBOCOR) 100 MG tablet, TAKE 1 TABLET BY MOUTH TWICE DAILY, Disp: 60 tablet, Rfl: 3 .  HYDROcodone-acetaminophen (NORCO/VICODIN) 5-325 MG tablet, Take one to two tablets every 4-6 hours as needed for pain, Disp: 40 tablet, Rfl: 0 .  lovastatin (MEVACOR) 20 MG tablet, TAKE 1 TABLET BY MOUTH ONCE DAILY AT  6  PM, Disp: 90 tablet, Rfl: 3 .  metFORMIN (GLUCOPHAGE) 500 MG tablet, Take 500 mg by mouth daily., Disp: , Rfl:  .  metoprolol succinate (TOPROL-XL) 50 MG 24 hr tablet, Take 75 mg by mouth daily. Take with or immediately following a meal., Disp: , Rfl:  .  metoprolol succinate (TOPROL-XL) 50 MG 24 hr tablet, TAKE 1 & 1/2 (ONE & ONE-HALF) TABLETS BY MOUTH ONCE DAILY, Disp: 135 tablet, Rfl: 3 .  nitroGLYCERIN (NITROSTAT) 0.4 MG SL tablet, Place 1 tablet (0.4 mg total) under the tongue every 5 (five) minutes as needed for chest pain., Disp: 25 tablet, Rfl: 3 .  XARELTO 20 MG TABS tablet, Take 1 tablet (20 mg total)  by mouth daily with supper., Disp: 28 tablet, Rfl: 0 .  traZODone (DESYREL) 50 MG tablet, Take 1 tablet (50 mg total) by mouth at bedtime. (Patient not taking: Reported on 05/07/2017), Disp: 30 tablet, Rfl: 2   Review of Systems  Constitutional: Positive for fatigue. Negative for appetite change, chills, diaphoresis, fever and unexpected weight change.  HENT: Positive for congestion, dental problem and sneezing. Negative for drooling, ear pain, facial swelling, hearing loss, mouth sores, sore throat, trouble swallowing and voice change.   Eyes: Positive for visual disturbance. Negative for pain, discharge, redness and itching.  Respiratory: Positive for cough, shortness of breath and wheezing. Negative for choking.   Cardiovascular: Positive for chest pain and palpitations.  Negative for leg swelling.  Gastrointestinal: Positive for abdominal pain. Negative for blood in stool, constipation, diarrhea and vomiting.  Endocrine: Positive for heat intolerance and polydipsia. Negative for cold intolerance.  Genitourinary: Positive for decreased urine volume. Negative for dysuria and hematuria.  Musculoskeletal: Positive for arthralgias, back pain and gait problem.  Skin: Negative for rash.  Allergic/Immunologic: Negative for environmental allergies.  Neurological: Positive for light-headedness and headaches. Negative for seizures and syncope.  Hematological: Negative for adenopathy.  Psychiatric/Behavioral: Positive for agitation and dysphoric mood. Negative for suicidal ideas. The patient is nervous/anxious.     Per HPI unless specifically indicated above     Objective:    BP 136/80 (BP Location: Left Arm, Patient Position: Sitting, Cuff Size: Large)   Pulse 64   Ht 5\' 11"  (1.803 m)   Wt 211 lb (95.7 kg)   SpO2 98%   BMI 29.43 kg/m   Wt Readings from Last 3 Encounters:  05/30/17 211 lb (95.7 kg)  05/07/17 213 lb (96.6 kg)  03/23/17 211 lb (95.7 kg)    Physical Exam  Constitutional: He is oriented to person, place, and time. He appears well-developed and well-nourished.  HENT:  Head: Normocephalic and atraumatic.  Neck: Neck supple.  Cardiovascular: Normal rate and regular rhythm.  Pulmonary/Chest: Effort normal and breath sounds normal. He has no wheezes.  Abdominal: Soft. Bowel sounds are normal. There is no hepatosplenomegaly. There is no tenderness.  Musculoskeletal: He exhibits no edema.  Lymphadenopathy:    He has no cervical adenopathy.  Neurological: He is alert and oriented to person, place, and time.  Skin: Skin is warm and dry.  Psychiatric: He has a normal mood and affect. His behavior is normal.  Vitals reviewed.   Results for orders placed or performed during the hospital encounter of 05/29/17  PSA  Result Value Ref Range    Prostatic Specific Antigen 0.53 0.00 - 4.00 ng/mL  HgB A1c  Result Value Ref Range   Hgb A1c MFr Bld 8.8 (H) 4.8 - 5.6 %   Mean Plasma Glucose 205.86 mg/dL  Comprehensive metabolic panel  Result Value Ref Range   Sodium 137 135 - 145 mmol/L   Potassium 3.4 (L) 3.5 - 5.1 mmol/L   Chloride 99 (L) 101 - 111 mmol/L   CO2 29 22 - 32 mmol/L   Glucose, Bld 194 (H) 65 - 99 mg/dL   BUN 17 6 - 20 mg/dL   Creatinine, Ser 0.96 0.61 - 1.24 mg/dL   Calcium 9.2 8.9 - 10.3 mg/dL   Total Protein 7.1 6.5 - 8.1 g/dL   Albumin 4.2 3.5 - 5.0 g/dL   AST 30 15 - 41 U/L   ALT 51 17 - 63 U/L   Alkaline Phosphatase 59 38 - 126 U/L  Total Bilirubin 0.3 0.3 - 1.2 mg/dL   GFR calc non Af Amer >60 >60 mL/min   GFR calc Af Amer >60 >60 mL/min   Anion gap 9 5 - 15  Lipid panel  Result Value Ref Range   Cholesterol 157 0 - 200 mg/dL   Triglycerides 172 (H) <150 mg/dL   HDL 32 (L) >40 mg/dL   Total CHOL/HDL Ratio 4.9 RATIO   VLDL 34 0 - 40 mg/dL   LDL Cholesterol 91 0 - 99 mg/dL      Assessment & Plan:   Encounter Diagnoses  Name Primary?  [name] Uncontrolled diabetes mellitus with complications ([place]) Yes  . Paroxysmal atrial fibrillation (HCC)   . Chronic obstructive pulmonary disease, unspecified COPD type ([place])   . Cigarette nicotine dependence with nicotine-induced disorder   . Hyperlipidemia, unspecified hyperlipidemia type   . Chronic low back pain without sciatica, unspecified back pain laterality   . Anxiety and depression   . Arteriosclerotic cardiovascular disease (ASCVD)   . Hypokalemia   . Essential hypertension       -reviewed labs with pt -pt to get blood drawn next week to recheck low K+ -pt encouraged to contact cardiology to schedule ablation -Pt to discuss insomnia with mental health provider -stop the lovastatin and start simvatatin.  Encouraged pt to continue low-fat diet -will Increase metformin to bid and discussed with pt that he needs to stop mt dew -pt has his own eye  exam planned -counseled smoking cessation -pt to follow up here in 3 months.  He is to RTO sooner prn

## 2017-05-30 NOTE — Patient Instructions (Addendum)
-  Call cardiology to schedule follow-up/ablation -discuss insomnia with counselor/mental health -start simvastatin/stop lovastatin -recheck potassium/blood test next week -increase metformin to twice daily and stop [facility]

## 2017-06-14 ENCOUNTER — Encounter (HOSPITAL_COMMUNITY): Payer: Self-pay | Admitting: Psychiatry

## 2017-06-14 ENCOUNTER — Other Ambulatory Visit (HOSPITAL_COMMUNITY)
Admission: RE | Admit: 2017-06-14 | Discharge: 2017-06-14 | Disposition: A | Discharge status: Home / Self Care | Payer: Self-pay | Source: Ambulatory Visit | Attending: Physician Assistant | Admitting: Physician Assistant

## 2017-06-14 ENCOUNTER — Ambulatory Visit (INDEPENDENT_AMBULATORY_CARE_PROVIDER_SITE_OTHER): Payer: Self-pay | Admitting: Psychiatry

## 2017-06-14 ENCOUNTER — Other Ambulatory Visit: Payer: Self-pay | Admitting: Physician Assistant

## 2017-06-14 VITALS — BP 130/84 | HR 65 | Ht 71.0 in | Wt 208.0 lb

## 2017-06-14 DIAGNOSIS — Z810 Family history of intellectual disabilities: Secondary | ICD-10-CM

## 2017-06-14 DIAGNOSIS — Z818 Family history of other mental and behavioral disorders: Secondary | ICD-10-CM

## 2017-06-14 DIAGNOSIS — Z560 Unemployment, unspecified: Secondary | ICD-10-CM

## 2017-06-14 DIAGNOSIS — E785 Hyperlipidemia, unspecified: Secondary | ICD-10-CM

## 2017-06-14 DIAGNOSIS — E118 Type 2 diabetes mellitus with unspecified complications: Principal | ICD-10-CM

## 2017-06-14 DIAGNOSIS — G8929 Other chronic pain: Secondary | ICD-10-CM

## 2017-06-14 DIAGNOSIS — E876 Hypokalemia: Secondary | ICD-10-CM | POA: Insufficient documentation

## 2017-06-14 DIAGNOSIS — F17210 Nicotine dependence, cigarettes, uncomplicated: Secondary | ICD-10-CM

## 2017-06-14 DIAGNOSIS — G4700 Insomnia, unspecified: Secondary | ICD-10-CM

## 2017-06-14 DIAGNOSIS — IMO0002 Reserved for concepts with insufficient information to code with codable children: Secondary | ICD-10-CM

## 2017-06-14 DIAGNOSIS — F4001 Agoraphobia with panic disorder: Secondary | ICD-10-CM

## 2017-06-14 DIAGNOSIS — R002 Palpitations: Secondary | ICD-10-CM

## 2017-06-14 DIAGNOSIS — I4891 Unspecified atrial fibrillation: Secondary | ICD-10-CM

## 2017-06-14 DIAGNOSIS — Z811 Family history of alcohol abuse and dependence: Secondary | ICD-10-CM

## 2017-06-14 DIAGNOSIS — M2550 Pain in unspecified joint: Secondary | ICD-10-CM

## 2017-06-14 DIAGNOSIS — I10 Essential (primary) hypertension: Secondary | ICD-10-CM

## 2017-06-14 DIAGNOSIS — M549 Dorsalgia, unspecified: Secondary | ICD-10-CM

## 2017-06-14 DIAGNOSIS — I2510 Atherosclerotic heart disease of native coronary artery without angina pectoris: Secondary | ICD-10-CM

## 2017-06-14 DIAGNOSIS — E1165 Type 2 diabetes mellitus with hyperglycemia: Secondary | ICD-10-CM

## 2017-06-14 LAB — POTASSIUM: Potassium: 4 mmol/L (ref 3.5–5.1)

## 2017-06-14 MED ORDER — ALPRAZOLAM 0.5 MG PO TABS: ORAL_TABLET | ORAL | 2 refills | Status: DC

## 2017-06-14 NOTE — Progress Notes (Signed)
[facility] MD/PA/NP OP Progress Note  06/14/2017 2:27 PM Richard [name]  MRN:  [id number]  Chief Complaint:  Chief Complaint    Anxiety; Follow-up     HPI: This 51 year old Caucasian male who came for his followup appointment. He is currently living with his wife in [place]. He is unemployed and applying for disability.  The patient states that he has been employed for 5 years. 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 He got injured on the job and both shoulders were torn. He has not been able to work ever since and he feels very badly about this. 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  Since he's not been able to work the patient has been having increasingly depressed and anxious. He feels like his body gives out when he is trying to exert himself even in a minor way.  The patient returns after 4 months.  He is still struggling financially.  He never got approved for disability after numerous times applying.  He and his wife are barely making ends meet.  He did get approved for a [employer] care which will be helpful.  His cardiologist wants him to have cardiac ablation due to his chronic A. fib which has not responded well to medications.  He states he is not sure about this.  He states his A. fib gets worse when he eats certain things like red meat so he tries to go without meat for a while.  He sleeps okay with the Xanax.  He never tried the trazodone because he cannot afford it.  He still very angry that other people can get disability and he cannot.  He states when he tries to do any sort of work he gets short of breath his A. fib kicks in and he gets nervous and panicky.  He tries to keep going the best he can. Visit Diagnosis:    ICD-10-CM   1. 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  Arthritis    "legs, spine, shoulders" (08/12/2015)  . Asthma   . Chest pain    + palpitations; cath 2005- 30-40% mid LAD, 20% D1, 20% cx, OM, 20-30% RCA, and EF-55%  . Colitis 1990  . COPD (chronic obstructive pulmonary disease) ([place])   . Depression   . Dysrhythmia    AFib  . Gastric ulcer 2003; 2012   2003: + esophagitis; negative H.pylori serology  2012: Dr. Oneida Alar, mild gastritis, Bravo PH probe placement, negative H.pylori  . GERD (gastroesophageal reflux disease)   . Hepatic steatosis   . History of hiatal hernia   . Hyperlipemia   . Hypertension   . Overweight   . Panic attacks   . Paroxysmal atrial fibrillation (HCC)   . Stroke [facility])    mucsle in left side of face dosent work as its supposed to-only deficit.  [name] TIA (transient ischemic attack)    after cath 1/ 17  . Tobacco abuse    1/2 pack per day  . Type II diabetes mellitus ([place])     Past Surgical History:  Procedure Laterality Date  . BRAVO [place] STUDY  05/03/2011   EVO:JJKK gastritis/normal esophagus and duodenum  . CARDIAC CATHETERIZATION  1990s X 1; 2005; 08/12/2015  . CARDIAC CATHETERIZATION N/A 08/12/2015  Procedure: Left Heart Cath and Coronary Angiography;  Surgeon: Belva Crome, MD; LAD 20%, CFX 65%, RCA 20%, EF 60%   . COLONOSCOPY  1990  . COLONOSCOPY WITH PROPOFOL N/A 11/21/2016   Procedure: COLONOSCOPY WITH PROPOFOL;  Surgeon: [name], MD;  Location: [facility];  Service: Endoscopy;  Laterality: N/A;  7:30AM  . ESOPHAGOGASTRODUODENOSCOPY  05/03/2011   FUX:NATF gastritis  . NECK MASS EXCISION Right    "done in dr's office; behind right ear/side of ncek"  . POLYPECTOMY  11/21/2016   Procedure: POLYPECTOMY;  Surgeon: [name], MD;  Location: [facility];  Service: Endoscopy;;  descending colon polyp  . SHOULDER ARTHROSCOPY W/ ROTATOR CUFF REPAIR Right 2006   acromioclavicular joint arthrosis    Family Psychiatric History: See below  Family History:  Family History  Problem Relation  Age of Onset  . Lung cancer Mother   . Alcohol abuse Mother   . Heart attack Father 29  . Diabetes Father   . Alcohol abuse Father   . Hypertension Brother   . Hypertension Brother   . Anxiety disorder Sister   . Depression Sister   . Anxiety disorder Sister   . Heart attack Brother 16  . Diabetes Brother   . Hypertension Brother   . Seizures Brother   . Dementia Paternal Uncle   . Dementia Cousin   . ADD / ADHD Daughter   . Colon cancer Neg Hx   . Drug abuse Neg Hx   . Bipolar disorder Neg Hx   . OCD Neg Hx   . Paranoid behavior Neg Hx   . Schizophrenia Neg Hx   . Sexual abuse Neg Hx   . Physical abuse Neg Hx     Social History:  Social History   Socioeconomic History  . Marital status: Married    Spouse name: None  . Number of children: None  . Years of education: None  . Highest education level: None  Social Needs  . Financial resource strain: None  . Food insecurity - worry: None  . Food insecurity - inability: None  . Transportation needs - medical: None  . Transportation needs - non-medical: None  Occupational History  . Occupation: full time    Employer: UNEMPLOYED  Tobacco Use  . Smoking status: Current Every Day Smoker    Packs/day: 0.50    Years: 25.00    Pack years: 12.50    Types: Cigarettes    Start date: 07/17/1982  . Smokeless tobacco: Never Used  Substance and Sexual Activity  . Alcohol use: No    Alcohol/week: 0.0 oz  . Drug use: No  . Sexual activity: Yes    Birth control/protection: None  Other Topics Concern  . None  Social History Narrative   Pt lives in [place] with wife.  5 children.  Unemployed due to panic attacks and back pain    Allergies:  Allergies  Allergen Reactions  . Dexilant [Dexlansoprazole] Anaphylaxis  . Mushroom Ext Cmplx(Shiitake-Reishi-Mait) Anaphylaxis    Rapid heart rate.  . Penicillins Anaphylaxis    Has patient had a PCN reaction causing immediate rash, facial/tongue/throat swelling, SOB or  lightheadedness with hypotension: Yes Has patient had a PCN reaction causing severe rash involving mucus membranes or skin necrosis: No Has patient had a PCN reaction that required hospitalization Yes Has patient had a PCN reaction occurring within the last 10 years: No If all of the above answers are "NO", then may proceed with Cephalosporin  use.   . Doxycycline     Metabolic Disorder Labs: Lab Results  Component Value Date   HGBA1C 8.8 (H) 05/29/2017   MPG 205.86 05/29/2017   MPG 177 08/13/2015   No results found for: PROLACTIN Lab Results  Component Value Date   CHOL 157 05/29/2017   TRIG 172 (H) 05/29/2017   HDL 32 (L) 05/29/2017   CHOLHDL 4.9 05/29/2017   VLDL 34 05/29/2017   LDLCALC 91 05/29/2017   LDLCALC 64 08/13/2015   Lab Results  Component Value Date   TSH 0.958 04/21/2011   TSH 1.402 05/27/2009    Therapeutic Level Labs: No results found for: LITHIUM No results found for: VALPROATE No components found for:  CBMZ  Current Medications: Current Outpatient Medications  Medication Sig Dispense Refill  . acetaminophen (TYLENOL) 325 MG tablet Take 650 mg by mouth every 6 (six) hours as needed for pain or fever.     [name] albuterol (VENTOLIN HFA) 108 (90 BASE) MCG/ACT inhaler Inhale 2 puffs into the lungs every 6 (six) hours as needed for wheezing or shortness of breath.     . ALPRAZolam (XANAX) 0.5 MG tablet Take one tablet by mouth three times a day and two tablets at night 150 tablet 2  . amLODipine (NORVASC) 5 MG tablet Take 1 tablet (5 mg total) by mouth daily. 30 tablet 0  . budesonide-formoterol (SYMBICORT) 80-4.5 MCG/ACT inhaler Inhale 2 puffs into the lungs 2 (two) times daily as needed.     . calcium carbonate (TUMS - DOSED IN MG ELEMENTAL CALCIUM) 500 MG chewable tablet Chew 1 tablet by mouth daily as needed for indigestion or heartburn.    . cyclobenzaprine (FLEXERIL) 10 MG tablet Take 1 tablet (10 mg total) by mouth 3 (three) times daily. (Patient taking  differently: Take 10 mg by mouth 3 (three) times daily as needed for muscle spasms. ) 21 tablet 0  . esomeprazole (NEXIUM) 20 MG capsule Take 40 mg by mouth at bedtime.    . flecainide (TAMBOCOR) 100 MG tablet TAKE 1 TABLET BY MOUTH TWICE DAILY 60 tablet 3  . HYDROcodone-acetaminophen (NORCO/VICODIN) 5-325 MG tablet Take one to two tablets every 4-6 hours as needed for pain 40 tablet 0  . metFORMIN (GLUCOPHAGE) 500 MG tablet Take 500 mg by mouth daily.    . metoprolol succinate (TOPROL-XL) 50 MG 24 hr tablet Take 75 mg by mouth daily. Take with or immediately following a meal.    . metoprolol succinate (TOPROL-XL) 50 MG 24 hr tablet TAKE 1 & 1/2 (ONE & ONE-HALF) TABLETS BY MOUTH ONCE DAILY 135 tablet 3  . nitroGLYCERIN (NITROSTAT) 0.4 MG SL tablet Place 1 tablet (0.4 mg total) under the tongue every 5 (five) minutes as needed for chest pain. 25 tablet 3  . simvastatin (ZOCOR) 20 MG tablet Take 1 tablet (20 mg total) at bedtime by mouth. 30 tablet 4  . XARELTO 20 MG TABS tablet Take 1 tablet (20 mg total) by mouth daily with supper. 28 tablet 0   No current facility-administered medications for this visit.      Musculoskeletal: Strength & Muscle Tone: within normal limits Gait & Station: normal Patient leans: N/A  Psychiatric Specialty Exam: Review of Systems  Cardiovascular: Positive for palpitations.  Musculoskeletal: Positive for back pain and joint pain.  Psychiatric/Behavioral: The patient is nervous/anxious and has insomnia.   All other systems reviewed and are negative.   Blood pressure 130/84, pulse 65, height 5\' 11"  (1.803 m), weight 208 lb (94.3  kg), SpO2 97 %.Body mass index is 29.01 kg/m.  General Appearance: Casual and Fairly Groomed  Eye Contact:  Good  Speech:  Clear and Coherent  Volume:  Normal  Mood:  Anxious and Dysphoric  Affect:  Constricted  Thought Process:  Goal Directed  Orientation:  Full (Time, Place, and Person)  Thought Content: Rumination   Suicidal  Thoughts:  No  Homicidal Thoughts:  No  Memory:  Immediate;   Good Recent;   Good Remote;   Good  Judgement:  Fair  Insight:  Fair  Psychomotor Activity:  Normal  Concentration:  Concentration: Fair and Attention Span: Fair  Recall:  Good  Fund of Knowledge: Good  Language: Good  Akathisia:  No  Handed:  Right  AIMS (if indicated): not done  Assets:  Communication Skills Desire for Improvement Resilience Social Support  ADL's:  Intact  Cognition: WNL  Sleep:  Poor   Screenings: PHQ2-9     Patient Outreach Telephone from 08/30/2015 in [facility] Patient Outreach Telephone from 08/26/2015 in [facility]  PHQ-2 Total Score  2  4  PHQ-9 Total Score  8  8       Assessment and Plan: This patient is a 51 year old male with a history of panic attacks generalized anxiety chronic pain and atrial fibrillation.  All of these things seem to work in conjunction to make each other worse.  He is primarily frustrated because he cannot make it financially.  I encouraged him to retry for disability or at least try to get Medicaid assistance.  For now he will continue Xanax 0.5 mg 4 times a day as needed.  We have tried other medications for sleep and depression and he either does not take them or they do not work.  He agrees to start counseling here and he will return to see me in 3 months   [name], MD 06/14/2017, 2:27 PM

## 2017-07-05 ENCOUNTER — Ambulatory Visit (HOSPITAL_COMMUNITY): Payer: Self-pay | Admitting: Licensed Clinical Social Worker

## 2017-07-18 ENCOUNTER — Telehealth: Payer: Self-pay

## 2017-07-18 NOTE — Telephone Encounter (Signed)
Pts spouse walked in office to get samples of Nexium 40mg  for pt. Samples give, Nexium 40mg  one tab po bid daily as directed.

## 2017-07-26 ENCOUNTER — Telehealth: Payer: Self-pay | Admitting: *Deleted

## 2017-07-26 NOTE — Telephone Encounter (Signed)
Noted, no further recommendations.  We generally do not keep samples of Nexium here.

## 2017-07-26 NOTE — Telephone Encounter (Signed)
Requesting samples of nexium 40 mg. Patient was given samples on 07/18/17

## 2017-07-26 NOTE — Telephone Encounter (Signed)
Routing to Doris 

## 2017-07-26 NOTE — Telephone Encounter (Signed)
We did not have samples of the Nexium 40 mg. I have left him OTC samples of Nexium at the front desk and left Vm to pick up.

## 2017-08-06 ENCOUNTER — Encounter: Payer: Self-pay | Admitting: Physician Assistant

## 2017-08-06 ENCOUNTER — Ambulatory Visit: Payer: Self-pay | Admitting: Physician Assistant

## 2017-08-06 VITALS — BP 120/68 | HR 61 | Temp 97.9°F | Ht 71.0 in | Wt 210.5 lb

## 2017-08-06 DIAGNOSIS — J449 Chronic obstructive pulmonary disease, unspecified: Secondary | ICD-10-CM

## 2017-08-06 DIAGNOSIS — K0889 Other specified disorders of teeth and supporting structures: Secondary | ICD-10-CM

## 2017-08-06 DIAGNOSIS — J00 Acute nasopharyngitis [common cold]: Secondary | ICD-10-CM

## 2017-08-06 DIAGNOSIS — F17219 Nicotine dependence, cigarettes, with unspecified nicotine-induced disorders: Secondary | ICD-10-CM

## 2017-08-06 DIAGNOSIS — I480 Paroxysmal atrial fibrillation: Secondary | ICD-10-CM

## 2017-08-06 MED ORDER — CLINDAMYCIN HCL 300 MG PO CAPS: 300.0000 mg | ORAL_CAPSULE | Freq: Three times a day (TID) | ORAL | 0 refills | Status: DC

## 2017-08-06 NOTE — Progress Notes (Signed)
BP 120/68 (BP Location: Left Arm, Patient Position: Sitting, Cuff Size: Normal)   Pulse 61   Temp 97.9 F (36.6 C)   Ht 5\' 11"  (1.803 m)   Wt 210 lb 8 oz (95.5 kg)   SpO2 95%   BMI 29.36 kg/m    Subjective:    Patient ID: Richard [name], male    DOB: 08/03/1965, 52 y.o.   MRN: [id number]  HPI: Richard [name] is a 52 y.o. male presenting on 08/06/2017 for Dental Pain (front tooth with gum swelling. no drainage. ) and Nasal Congestion (cough)   HPI  Chief Complaint  Patient presents with  . Dental Pain    front tooth with gum swelling. no drainage.   . Nasal Congestion    cough    Tooth pain radiating up into face.   Cough for 7-8 days and congestion.  No fever  Pt continues to smokes.   Pt last seen by Dr Rayann Heman (cardiology) for ablataion discussion in early September.  Pt was supposed to check on his [employer] discount and then call to schedule procedure.  He says he still isn't sure if he wants the procudure.  He continues on xarelto.  Relevant past medical, surgical, family and social history reviewed and updated as indicated. Interim medical history since our last visit reviewed. Allergies and medications reviewed and updated.   Current Outpatient Medications:  .  acetaminophen (TYLENOL) 325 MG tablet, Take 650 mg by mouth every 6 (six) hours as needed for pain or fever. , Disp: , Rfl:  .  albuterol (VENTOLIN HFA) 108 (90 BASE) MCG/ACT inhaler, Inhale 2 puffs into the lungs every 6 (six) hours as needed for wheezing or shortness of breath. , Disp: , Rfl:  .  ALPRAZolam (XANAX) 0.5 MG tablet, Take one tablet by mouth three times a day and two tablets at night, Disp: 150 tablet, Rfl: 2 .  amLODipine (NORVASC) 5 MG tablet, Take 1 tablet (5 mg total) by mouth daily., Disp: 30 tablet, Rfl: 0 .  budesonide-formoterol (SYMBICORT) 80-4.5 MCG/ACT inhaler, Inhale 2 puffs into the lungs 2 (two) times daily as needed. , Disp: , Rfl:  .  calcium carbonate (TUMS - DOSED IN MG ELEMENTAL  CALCIUM) 500 MG chewable tablet, Chew 1 tablet by mouth daily as needed for indigestion or heartburn., Disp: , Rfl:  .  cyclobenzaprine (FLEXERIL) 10 MG tablet, Take 1 tablet (10 mg total) by mouth 3 (three) times daily. (Patient taking differently: Take 10 mg by mouth 3 (three) times daily as needed for muscle spasms. ), Disp: 21 tablet, Rfl: 0 .  esomeprazole (NEXIUM) 20 MG capsule, Take 40 mg by mouth at bedtime., Disp: , Rfl:  .  flecainide (TAMBOCOR) 100 MG tablet, TAKE 1 TABLET BY MOUTH TWICE DAILY, Disp: 60 tablet, Rfl: 3 .  HYDROcodone-acetaminophen (NORCO/VICODIN) 5-325 MG tablet, Take one to two tablets every 4-6 hours as needed for pain, Disp: 40 tablet, Rfl: 0 .  metFORMIN (GLUCOPHAGE) 500 MG tablet, Take 500 mg by mouth daily., Disp: , Rfl:  .  metoprolol succinate (TOPROL-XL) 50 MG 24 hr tablet, TAKE 1 & 1/2 (ONE & ONE-HALF) TABLETS BY MOUTH ONCE DAILY, Disp: 135 tablet, Rfl: 3 .  nitroGLYCERIN (NITROSTAT) 0.4 MG SL tablet, Place 1 tablet (0.4 mg total) under the tongue every 5 (five) minutes as needed for chest pain., Disp: 25 tablet, Rfl: 3 .  simvastatin (ZOCOR) 20 MG tablet, Take 1 tablet (20 mg total) at bedtime by mouth., Disp:  30 tablet, Rfl: 4 .  XARELTO 20 MG TABS tablet, Take 1 tablet (20 mg total) by mouth daily with supper., Disp: 28 tablet, Rfl: 0 .  metoprolol succinate (TOPROL-XL) 50 MG 24 hr tablet, Take 75 mg by mouth daily. Take with or immediately following a meal., Disp: , Rfl:    Review of Systems  Constitutional: Negative for fever.  HENT: Positive for congestion, dental problem and sneezing. Negative for ear pain and sore throat.   Respiratory: Positive for cough, shortness of breath and wheezing.   Gastrointestinal: Negative for abdominal pain.    Per HPI unless specifically indicated above     Objective:    BP 120/68 (BP Location: Left Arm, Patient Position: Sitting, Cuff Size: Normal)   Pulse 61   Temp 97.9 F (36.6 C)   Ht 5\' 11"  (1.803 m)   Wt  210 lb 8 oz (95.5 kg)   SpO2 95%   BMI 29.36 kg/m   Wt Readings from Last 3 Encounters:  08/06/17 210 lb 8 oz (95.5 kg)  05/30/17 211 lb (95.7 kg)  05/07/17 213 lb (96.6 kg)    Physical Exam  Constitutional: He is oriented to person, place, and time. He appears well-developed and well-nourished.  HENT:  Head: Normocephalic and atraumatic.  Right Ear: Hearing, tympanic membrane, external ear and ear canal normal.  Left Ear: Hearing, tympanic membrane, external ear and ear canal normal.  Nose: Nose normal.  Mouth/Throat: Uvula is midline and oropharynx is clear and moist. Abnormal dentition. Dental caries present. No uvula swelling. No oropharyngeal exudate, posterior oropharyngeal edema, posterior oropharyngeal erythema or tonsillar abscesses.  Left upper front tooth is half rotted away.  Surrounding gingiva is inflamed and swelled. No abscess seen.  There is extensive dental decay.   Neck: Neck supple.  Cardiovascular: Normal rate and regular rhythm.  Pulmonary/Chest: Effort normal and breath sounds normal.  Lymphadenopathy:    He has no cervical adenopathy.  Neurological: He is alert and oriented to person, place, and time.  Skin: Skin is warm and dry.  Psychiatric: He has a normal mood and affect. His behavior is normal.  Vitals reviewed.       Assessment & Plan:   Encounter Diagnoses  Name Primary?  Paschal Dopp Yes  . Acute nasopharyngitis   . Cigarette nicotine dependence with nicotine-induced disorder   . Paroxysmal atrial fibrillation (HCC)   . Chronic obstructive pulmonary disease, unspecified COPD type ([place])      -Pt encouraged to go to Dental Clinic this Friday in [place].  He is put on dental list as well -rx clindaycin for tooth (as he has PCN allergy) -counseled pt to Stop smoking and discussed how smoking is bad for oral health -pt is encouraged to check on his [employer] discount -he needs to call dr Allred back for follow up -pt declines anti-tussive Rx -pt  to return here next month as scheduled.  RTO sooner prn

## 2017-08-06 NOTE — Patient Instructions (Signed)
Coping with Quitting Smoking Quitting smoking is a physical and mental challenge. You will face cravings, withdrawal symptoms, and temptation. Before quitting, work with your health care provider to make a plan that can help you cope. Preparation can help you quit and keep you from giving in. How can I cope with cravings? Cravings usually last for 5-10 minutes. If you get through it, the craving will pass. Consider taking the following actions to help you cope with cravings:  Keep your mouth busy: ? Chew sugar-free gum. ? Suck on hard candies or a straw. ? Brush your teeth.  Keep your hands and body busy: ? Immediately change to a different activity when you feel a craving. ? Squeeze or play with a ball. ? Do an activity or a hobby, like making bead jewelry, practicing needlepoint, or working with wood. ? Mix up your normal routine. ? Take a short exercise break. Go for a quick walk or run up and down stairs. ? Spend time in public places where smoking is not allowed.  Focus on doing something kind or helpful for someone else.  Call a friend or family member to talk during a craving.  Join a support group.  Call a quit line, such as 1-800-QUIT-NOW.  Talk with your health care provider about medicines that might help you cope with cravings and make quitting easier for you.  How can I deal with withdrawal symptoms? Your body may experience negative effects as it tries to get used to not having nicotine in the system. These effects are called withdrawal symptoms. They may include:  Feeling hungrier than normal.  Trouble concentrating.  Irritability.  Trouble sleeping.  Feeling depressed.  Restlessness and agitation.  Craving a cigarette.  To manage withdrawal symptoms:  Avoid places, people, and activities that trigger your cravings.  Remember why you want to quit.  Get plenty of sleep.  Avoid coffee and other caffeinated drinks. These may worsen some of your  symptoms.  How can I handle social situations? Social situations can be difficult when you are quitting smoking, especially in the first few weeks. To manage this, you can:  Avoid parties, bars, and other social situations where people might be smoking.  Avoid alcohol.  Leave right away if you have the urge to smoke.  Explain to your family and friends that you are quitting smoking. Ask for understanding and support.  Plan activities with friends or family where smoking is not an option.  What are some ways I can cope with stress? Wanting to smoke may cause stress, and stress can make you want to smoke. Find ways to manage your stress. Relaxation techniques can help. For example:  Breathe slowly and deeply, in through your nose and out through your mouth.  Listen to soothing, relaxing music.  Talk with a family member or friend about your stress.  Light a candle.  Soak in a bath or take a shower.  Think about a peaceful place.  What are some ways I can prevent weight gain? Be aware that many people gain weight after they quit smoking. However, not everyone does. To keep from gaining weight, have a plan in place before you quit and stick to the plan after you quit. Your plan should include:  Having healthy snacks. When you have a craving, it may help to: ? Eat plain popcorn, crunchy carrots, celery, or other cut vegetables. ? Chew sugar-free gum.  Changing how you eat: ? Eat small portion sizes at meals. ?   Eat 4-6 small meals throughout the day instead of 1-2 large meals a day. ? Be mindful when you eat. Do not watch television or do other things that might distract you as you eat.  Exercising regularly: ? Make time to exercise each day. If you do not have time for a long workout, do short bouts of exercise for 5-10 minutes several times a day. ? Do some form of strengthening exercise, like weight lifting, and some form of aerobic exercise, like running or  swimming.  Drinking plenty of water or other low-calorie or no-calorie drinks. Drink 6-8 glasses of water daily, or as much as instructed by your health care provider.  Summary  Quitting smoking is a physical and mental challenge. You will face cravings, withdrawal symptoms, and temptation to smoke again. Preparation can help you as you go through these challenges.  You can cope with cravings by keeping your mouth busy (such as by chewing gum), keeping your body and hands busy, and making calls to family, friends, or a helpline for people who want to quit smoking.  You can cope with withdrawal symptoms by avoiding places where people smoke, avoiding drinks with caffeine, and getting plenty of rest.  Ask your health care provider about the different ways to prevent weight gain, avoid stress, and handle social situations. This information is not intended to replace advice given to you by your health care provider. Make sure you discuss any questions you have with your health care provider. Document Released: 06/30/2016 Document Revised: 06/30/2016 Document Reviewed: 06/30/2016 Elsevier Interactive Patient Education  2018 [employer] Inc[employer]  

## 2017-08-09 ENCOUNTER — Telehealth: Payer: Self-pay

## 2017-08-09 NOTE — Telephone Encounter (Signed)
Pt requested samples of Nexium. We do not have the 40 mg tablets. I am leaving samples of the OTC Nexium at front for pick up, #20 tablets. Pt's wife is aware.

## 2017-08-20 ENCOUNTER — Telehealth: Payer: Self-pay | Admitting: Gastroenterology

## 2017-08-20 NOTE — Telephone Encounter (Signed)
PT is aware I have Nexium OTC #24 tablets at front to pick up.

## 2017-08-20 NOTE — Telephone Encounter (Signed)
Patient wife called inquiring if he could have some nexium samples

## 2017-08-30 ENCOUNTER — Ambulatory Visit: Payer: Self-pay | Admitting: Physician Assistant

## 2017-09-03 ENCOUNTER — Telehealth: Payer: Self-pay | Admitting: Gastroenterology

## 2017-09-03 NOTE — Telephone Encounter (Signed)
Samples of Nexium left up front for pt. Spouse notified.

## 2017-09-03 NOTE — Telephone Encounter (Signed)
Patient wife called inquiring if we have any nexium samples [phone]

## 2017-09-11 ENCOUNTER — Encounter: Payer: Self-pay | Admitting: Physician Assistant

## 2017-09-13 ENCOUNTER — Other Ambulatory Visit (HOSPITAL_COMMUNITY): Payer: Self-pay | Admitting: Cardiovascular Disease

## 2017-09-13 ENCOUNTER — Ambulatory Visit (HOSPITAL_COMMUNITY): Payer: Self-pay | Admitting: Psychiatry

## 2017-09-14 ENCOUNTER — Telehealth: Payer: Self-pay | Admitting: Cardiovascular Disease

## 2017-09-14 ENCOUNTER — Telehealth: Payer: Self-pay

## 2017-09-14 NOTE — Telephone Encounter (Signed)
Please give pt a call--has been having chest pain and feeling light headed. Scheduled to see SK on Tuesday 09/18/17

## 2017-09-14 NOTE — Telephone Encounter (Signed)
Returned pt's wife's call. She called concerned that the pt's left shoulder and neck has been hurting the last few days. He has not had actual chest pain, no SOB, no fatigue. His blood pressure has been good and he has not complained of any fast heart rates. An appointment was made for him to see Dr. Bronson Ing on 09/18/2017. I did let her know if he has any chest pain, that he can take his nitro as directed. She stated he don't usually have to take them. I advised her that if his shoulder kept hurting then he should go the the ED to be evaluated. She voiced understanding of plan.

## 2017-09-14 NOTE — Telephone Encounter (Signed)
Pt's spouse called for Nexium samples for her. Samples were left up front.

## 2017-09-18 ENCOUNTER — Ambulatory Visit (INDEPENDENT_AMBULATORY_CARE_PROVIDER_SITE_OTHER): Payer: Self-pay | Admitting: Cardiovascular Disease

## 2017-09-18 ENCOUNTER — Telehealth: Payer: Self-pay | Admitting: Gastroenterology

## 2017-09-18 ENCOUNTER — Encounter: Payer: Self-pay | Admitting: Cardiovascular Disease

## 2017-09-18 VITALS — BP 132/80 | HR 65 | Ht 71.0 in | Wt 210.0 lb

## 2017-09-18 DIAGNOSIS — I10 Essential (primary) hypertension: Secondary | ICD-10-CM

## 2017-09-18 DIAGNOSIS — I25118 Atherosclerotic heart disease of native coronary artery with other forms of angina pectoris: Secondary | ICD-10-CM

## 2017-09-18 DIAGNOSIS — F17200 Nicotine dependence, unspecified, uncomplicated: Secondary | ICD-10-CM

## 2017-09-18 DIAGNOSIS — I480 Paroxysmal atrial fibrillation: Secondary | ICD-10-CM

## 2017-09-18 DIAGNOSIS — R002 Palpitations: Secondary | ICD-10-CM

## 2017-09-18 NOTE — Patient Instructions (Signed)
Your physician recommends that you schedule a follow-up appointment in: as needed with Dr Bronson Ing   Your physician recommends that you continue on your current medications as directed. Please refer to the Current Medication list given to you today.    If you need a refill on your cardiac medications before your next appointment, please call your pharmacy.     No testing ordered today      Thank you for choosing [facility] !

## 2017-09-18 NOTE — Progress Notes (Signed)
SUBJECTIVE: The patient presents for routine evaluation.  He called our office within the past several days complaining of left shoulder and neck pain.  He denies chest pain, shortness of breath, and fatigue.  He has a history of very symptomatic paroxysmal atrial fibrillation and failed medical therapy with flecainide and metoprolol.  He was evaluated by EP on 03/23/17 and an atrial fibrillation ablation is being planned.  It has been delayed due to financial reasons.  He also has a history of nonobstructive coronary artery disease and panic attacks. Specifically, the distal left circumflex had a 65% stenosis , the mid to distal LAD had a 20% stenosis, and the proximal to distal RCA had a 20% stenosis. No lesions were hemodynamically significant.  Echocardiography on 08/12/15 demonstrated normal left ventricular systolic function and regional wall motion, LVEF 60-65%.  He underwent normal ABIs in September 2018.  He tells me about 2 weeks ago, he had some upper left-sided chest pains with left lateral neck pains and a feeling of left arm weakness.  Symptoms lasted for 2 days and then resolved.  He has no weakness in his legs and no slurred speech.  He also tells me that after skipping a few doses of flecainide at night he has less palpitations and is feeling much better.  He also described having some dizziness when bending over and standing up too quickly.  He is cut back his smoking and now 1 pack of cigarettes last him 4 days.  His friend buys in the cigarettes as he uses his money to pay for his medications.  I reviewed cervical spine CT dated 10/07/15 which showed mild degenerative cervical spine changes.      Review of Systems: As per "subjective", otherwise negative.  Allergies  Allergen Reactions  . Dexilant [Dexlansoprazole] Anaphylaxis  . Mushroom Ext Cmplx(Shiitake-Reishi-Mait) Anaphylaxis    Rapid heart rate.  . Penicillins Anaphylaxis    Has patient had a PCN reaction  causing immediate rash, facial/tongue/throat swelling, SOB or lightheadedness with hypotension: Yes Has patient had a PCN reaction causing severe rash involving mucus membranes or skin necrosis: No Has patient had a PCN reaction that required hospitalization Yes Has patient had a PCN reaction occurring within the last 10 years: No If all of the above answers are "NO", then may proceed with Cephalosporin use.   [name] Doxycycline     Current Outpatient Medications  Medication Sig Dispense Refill  . acetaminophen (TYLENOL) 325 MG tablet Take 650 mg by mouth every 6 (six) hours as needed for pain or fever.     [name] albuterol (VENTOLIN HFA) 108 (90 BASE) MCG/ACT inhaler Inhale 2 puffs into the lungs every 6 (six) hours as needed for wheezing or shortness of breath.     . ALPRAZolam (XANAX) 0.5 MG tablet Take one tablet by mouth three times a day and two tablets at night 150 tablet 2  . amLODipine (NORVASC) 5 MG tablet Take 1 tablet (5 mg total) by mouth daily. 30 tablet 0  . budesonide-formoterol (SYMBICORT) 80-4.5 MCG/ACT inhaler Inhale 2 puffs into the lungs 2 (two) times daily as needed.     . calcium carbonate (TUMS - DOSED IN MG ELEMENTAL CALCIUM) 500 MG chewable tablet Chew 1 tablet by mouth daily as needed for indigestion or heartburn.    . cyclobenzaprine (FLEXERIL) 10 MG tablet Take 1 tablet (10 mg total) by mouth 3 (three) times daily. (Patient taking differently: Take 10 mg by mouth 3 (three) times daily  as needed for muscle spasms. ) 21 tablet 0  . esomeprazole (NEXIUM) 20 MG capsule Take 40 mg by mouth at bedtime.    . flecainide (TAMBOCOR) 100 MG tablet TAKE 1 TABLET BY MOUTH TWICE DAILY 60 tablet 3  . HYDROcodone-acetaminophen (NORCO/VICODIN) 5-325 MG tablet Take one to two tablets every 4-6 hours as needed for pain 40 tablet 0  . metFORMIN (GLUCOPHAGE) 500 MG tablet Take 500 mg by mouth daily.    . metoprolol succinate (TOPROL-XL) 50 MG 24 hr tablet Take 75 mg by mouth daily. Take with or  immediately following a meal.    . metoprolol succinate (TOPROL-XL) 50 MG 24 hr tablet TAKE 1 & 1/2 (ONE & ONE-HALF) TABLETS BY MOUTH ONCE DAILY 135 tablet 3  . nitroGLYCERIN (NITROSTAT) 0.4 MG SL tablet Place 1 tablet (0.4 mg total) under the tongue every 5 (five) minutes as needed for chest pain. 25 tablet 3  . simvastatin (ZOCOR) 20 MG tablet Take 1 tablet (20 mg total) at bedtime by mouth. 30 tablet 4  . XARELTO 20 MG TABS tablet Take 1 tablet (20 mg total) by mouth daily with supper. 28 tablet 0   No current facility-administered medications for this visit.     Past Medical History:  Diagnosis Date  . Arthritis    "legs, spine, shoulders" (08/12/2015)  . Asthma   . Chest pain    + palpitations; cath 2005- 30-40% mid LAD, 20% D1, 20% cx, OM, 20-30% RCA, and EF-55%  . Colitis 1990  . COPD (chronic obstructive pulmonary disease) ([place])   . Depression   . Dysrhythmia    AFib  . Gastric ulcer 2003; 2012   2003: + esophagitis; negative H.pylori serology  2012: Dr. Oneida Alar, mild gastritis, Bravo PH probe placement, negative H.pylori  . GERD (gastroesophageal reflux disease)   . Hepatic steatosis   . History of hiatal hernia   . Hyperlipemia   . Hypertension   . Overweight   . Panic attacks   . Paroxysmal atrial fibrillation (HCC)   . Stroke [facility])    mucsle in left side of face dosent work as its supposed to-only deficit.  [name] TIA (transient ischemic attack)    after cath 1/ 17  . Tobacco abuse    1/2 pack per day  . Type II diabetes mellitus ([place])     Past Surgical History:  Procedure Laterality Date  . BRAVO [place] STUDY  05/03/2011   OFB:[facility] gastritis/normal esophagus and duodenum  . CARDIAC CATHETERIZATION  1990s X 1; 2005; 08/12/2015  . CARDIAC CATHETERIZATION N/A 08/12/2015   Procedure: Left Heart Cath and Coronary Angiography;  Surgeon: Belva Crome, MD; LAD 20%, CFX 65%, RCA 20%, EF 60%   . COLONOSCOPY  1990  . COLONOSCOPY WITH PROPOFOL N/A 11/21/2016   Procedure:  COLONOSCOPY WITH PROPOFOL;  Surgeon: [name], MD;  Location: [facility];  Service: Endoscopy;  Laterality: N/A;  7:30AM  . ESOPHAGOGASTRODUODENOSCOPY  05/03/2011   HEN:IDPO gastritis  . NECK MASS EXCISION Right    "done in dr's office; behind right ear/side of ncek"  . POLYPECTOMY  11/21/2016   Procedure: POLYPECTOMY;  Surgeon: [name], MD;  Location: [facility];  Service: Endoscopy;;  descending colon polyp  . SHOULDER ARTHROSCOPY W/ ROTATOR CUFF REPAIR Right 2006   acromioclavicular joint arthrosis    Social History   Socioeconomic History  . Marital status: Married    Spouse name: Not on file  . Number of children: Not  on file  . Years of education: Not on file  . Highest education level: Not on file  Social Needs  . Financial resource strain: Not on file  . Food insecurity - worry: Not on file  . Food insecurity - inability: Not on file  . Transportation needs - medical: Not on file  . Transportation needs - non-medical: Not on file  Occupational History  . Occupation: full time    Employer: UNEMPLOYED  Tobacco Use  . Smoking status: Current Every Day Smoker    Packs/day: 0.50    Years: 25.00    Pack years: 12.50    Types: Cigarettes    Start date: 07/17/1982  . Smokeless tobacco: Never Used  Substance and Sexual Activity  . Alcohol use: No    Alcohol/week: 0.0 oz  . Drug use: No  . Sexual activity: Yes    Birth control/protection: None  Other Topics Concern  . Not on file  Social History Narrative   Pt lives in [place] with wife.  5 children.  Unemployed due to panic attacks and back pain     Vitals:   09/18/17 0905  BP: 132/80  Pulse: 65  SpO2: 98%  Weight: 210 lb (95.3 kg)  Height: 5\' 11"  (1.803 m)    Wt Readings from Last 3 Encounters:  09/18/17 210 lb (95.3 kg)  08/06/17 210 lb 8 oz (95.5 kg)  05/30/17 211 lb (95.7 kg)     PHYSICAL EXAM General: NAD HEENT: Normal. Neck: No JVD, no thyromegaly. Lungs: Clear to  auscultation bilaterally with normal respiratory effort. CV: Regular rate and rhythm, normal S1/S2, no S3/S4, no murmur. No pretibial or periankle edema.  No carotid bruit.   Abdomen: Soft, nontender, no distention.  Neurologic: Alert and oriented.  Psych: Normal affect. Skin: Normal. Musculoskeletal: No gross deformities.    ECG: Most recent ECG reviewed.   Labs: Lab Results  Component Value Date/Time   K 4.0 06/14/2017 02:34 PM   K 4.3 10/23/2012   BUN 17 05/29/2017 08:18 AM   BUN 15 10/23/2012   CREATININE 0.96 05/29/2017 08:18 AM   CREATININE 0.85 08/03/2015 12:30 PM   ALT 51 05/29/2017 08:18 AM   TSH 0.958 04/21/2011 02:46 PM   HGB 15.9 11/16/2016 03:11 PM     Lipids: Lab Results  Component Value Date/Time   LDLCALC 91 05/29/2017 08:18 AM   CHOL 157 05/29/2017 08:18 AM   TRIG 172 (H) 05/29/2017 08:18 AM   HDL 32 (L) 05/29/2017 08:18 AM       ASSESSMENT AND PLAN: 1. Nonobstructive CAD: Symptomatically stable.  Symptoms described above do not sound cardiac in etiology and may have been due to nerve impingement originating in the cervical spine.  Cath results noted above. Continue metoprolol and statin. No further workup is indicated.  2. Essential HTN: Controlled on current therapy. No changes.  3. Paroxysmal atrial fibrillation and typical atrial flutter with debilitating palpitations:  Atrial fibrillation ablation is being planned.  He is anticoagulated with Xarelto.  Continue metoprolol.  He is less symptomatic when not taking flecainide.  He will follow-up with Dr. Rayann Heman once financial situation is straightened out.  4. Hyperlipidemia: Continue statin.  5. Tobacco abuse: Trying to quit.    Disposition: Follow up with me as needed.  Follow-up with Dr. Rayann Heman in the future.   Kate Sable, M.D., F.A.C.C.

## 2017-09-18 NOTE — Telephone Encounter (Signed)
Pt's wife called to say she came by the other day to pick up samples for patient and we didn't have many of the nexium. She is asking if we have gotten any more yet and could patient get some more. Please advise [phone]

## 2017-09-18 NOTE — Telephone Encounter (Signed)
Richard [name] is aware samples at at front for pick up of the Nexium OTC.

## 2017-09-19 NOTE — Telephone Encounter (Signed)
REVIEWED-NO ADDITIONAL RECOMMENDATIONS. 

## 2017-09-24 ENCOUNTER — Telehealth: Payer: Self-pay | Admitting: Gastroenterology

## 2017-09-24 NOTE — Telephone Encounter (Signed)
Pt's wife called to see if we had any Nexium samples for the patient and to call her at [phone] or him at [phone]

## 2017-09-24 NOTE — Telephone Encounter (Signed)
Pt spouse notified that no samples are available and she can check back with [name].

## 2017-10-01 ENCOUNTER — Telehealth: Payer: Self-pay | Admitting: Gastroenterology

## 2017-10-01 NOTE — Telephone Encounter (Signed)
Pt is aware we do not have any samples of Nexium.

## 2017-10-01 NOTE — Telephone Encounter (Signed)
Patient wife called and stated he needs nexium samples

## 2017-11-22 ENCOUNTER — Other Ambulatory Visit: Payer: Self-pay | Admitting: Physician Assistant

## 2017-12-05 ENCOUNTER — Encounter: Payer: Self-pay | Admitting: Physician Assistant

## 2017-12-05 ENCOUNTER — Ambulatory Visit: Payer: Self-pay | Admitting: Physician Assistant

## 2017-12-05 ENCOUNTER — Telehealth: Payer: Self-pay

## 2017-12-05 ENCOUNTER — Other Ambulatory Visit: Payer: Self-pay | Admitting: Physician Assistant

## 2017-12-05 VITALS — BP 131/78 | HR 58 | Temp 97.5°F | Ht 71.0 in | Wt 205.8 lb

## 2017-12-05 DIAGNOSIS — J449 Chronic obstructive pulmonary disease, unspecified: Secondary | ICD-10-CM

## 2017-12-05 DIAGNOSIS — Z91199 Patient's noncompliance with other medical treatment and regimen due to unspecified reason: Secondary | ICD-10-CM

## 2017-12-05 DIAGNOSIS — F17219 Nicotine dependence, cigarettes, with unspecified nicotine-induced disorders: Secondary | ICD-10-CM

## 2017-12-05 DIAGNOSIS — I480 Paroxysmal atrial fibrillation: Secondary | ICD-10-CM

## 2017-12-05 DIAGNOSIS — G8929 Other chronic pain: Secondary | ICD-10-CM

## 2017-12-05 DIAGNOSIS — E118 Type 2 diabetes mellitus with unspecified complications: Principal | ICD-10-CM

## 2017-12-05 DIAGNOSIS — M545 Low back pain, unspecified: Secondary | ICD-10-CM

## 2017-12-05 DIAGNOSIS — E1165 Type 2 diabetes mellitus with hyperglycemia: Secondary | ICD-10-CM

## 2017-12-05 DIAGNOSIS — F419 Anxiety disorder, unspecified: Secondary | ICD-10-CM

## 2017-12-05 DIAGNOSIS — I10 Essential (primary) hypertension: Secondary | ICD-10-CM

## 2017-12-05 DIAGNOSIS — E785 Hyperlipidemia, unspecified: Secondary | ICD-10-CM

## 2017-12-05 DIAGNOSIS — F329 Major depressive disorder, single episode, unspecified: Secondary | ICD-10-CM

## 2017-12-05 DIAGNOSIS — IMO0002 Reserved for concepts with insufficient information to code with codable children: Secondary | ICD-10-CM

## 2017-12-05 DIAGNOSIS — Z9119 Patient's noncompliance with other medical treatment and regimen: Secondary | ICD-10-CM

## 2017-12-05 DIAGNOSIS — I2510 Atherosclerotic heart disease of native coronary artery without angina pectoris: Secondary | ICD-10-CM

## 2017-12-05 NOTE — Telephone Encounter (Signed)
Pt's wife called for pt requesting samples of Nexium and I am leaving #12 boxes of 2 at front for pt. I am also leaving some coupons for them.  [name] is aware.

## 2017-12-05 NOTE — Progress Notes (Signed)
BP 131/78 (BP Location: Right Arm, Patient Position: Sitting, Cuff Size: Normal)   Pulse (!) 58   Temp (!) 97.5 F (36.4 C)   Ht 5\' 11"  (1.803 m)   Wt 205 lb 12 oz (93.3 kg)   SpO2 96%   BMI 28.70 kg/m    Subjective:    Patient ID: Richard [name], male    DOB: July 24, 1965, 52 y.o.   MRN: [id number]  HPI: Richard [name] is a 52 y.o. male presenting on 12/05/2017 for Follow-up   HPI   Pt cancelled his routine appointment here for February.    When asked pt if he was planning to do ablation he says he wasn't sure b/c he wanted to make sure that's what it was before he does that.  He says his issues have improved since stopping the flecainide.    He is going to [name] for mental health issues.    Pt states no new issues  Relevant past medical, surgical, family and social history reviewed and updated as indicated. Interim medical history since our last visit reviewed. Allergies and medications reviewed and updated.   Current Outpatient Medications:  .  acetaminophen (TYLENOL) 325 MG tablet, Take 650 mg by mouth every 6 (six) hours as needed for pain or fever. , Disp: , Rfl:  .  albuterol (VENTOLIN HFA) 108 (90 BASE) MCG/ACT inhaler, Inhale 2 puffs into the lungs every 6 (six) hours as needed for wheezing or shortness of breath. , Disp: , Rfl:  .  ALPRAZolam (XANAX) 0.5 MG tablet, Take one tablet by mouth three times a day and two tablets at night, Disp: 150 tablet, Rfl: 2 .  amLODipine (NORVASC) 5 MG tablet, Take 1 tablet (5 mg total) by mouth daily., Disp: 30 tablet, Rfl: 0 .  budesonide-formoterol (SYMBICORT) 80-4.5 MCG/ACT inhaler, Inhale 2 puffs into the lungs 2 (two) times daily as needed. , Disp: , Rfl:  .  calcium carbonate (TUMS - DOSED IN MG ELEMENTAL CALCIUM) 500 MG chewable tablet, Chew 1 tablet by mouth daily as needed for indigestion or heartburn., Disp: , Rfl:  .  cyclobenzaprine (FLEXERIL) 10 MG tablet, Take 1 tablet (10 mg total) by mouth 3 (three) times daily.  (Patient taking differently: Take 10 mg by mouth 3 (three) times daily as needed for muscle spasms. ), Disp: 21 tablet, Rfl: 0 .  esomeprazole (NEXIUM) 20 MG capsule, Take 40 mg by mouth at bedtime., Disp: , Rfl:  .  HYDROcodone-acetaminophen (NORCO/VICODIN) 5-325 MG tablet, Take one to two tablets every 4-6 hours as needed for pain, Disp: 40 tablet, Rfl: 0 .  metFORMIN (GLUCOPHAGE) 500 MG tablet, Take 500 mg by mouth daily., Disp: , Rfl:  .  metoprolol succinate (TOPROL-XL) 50 MG 24 hr tablet, Take 75 mg by mouth daily. Take with or immediately following a meal., Disp: , Rfl:  .  nitroGLYCERIN (NITROSTAT) 0.4 MG SL tablet, Place 1 tablet (0.4 mg total) under the tongue every 5 (five) minutes as needed for chest pain., Disp: 25 tablet, Rfl: 3 .  simvastatin (ZOCOR) 20 MG tablet, TAKE 1 TABLET BY MOUTH AT BEDTIME, Disp: 14 tablet, Rfl: 0 .  XARELTO 20 MG TABS tablet, Take 1 tablet (20 mg total) by mouth daily with supper., Disp: 28 tablet, Rfl: 0   Review of Systems  Constitutional: Positive for diaphoresis and fatigue. Negative for appetite change, chills, fever and unexpected weight change.  HENT: Positive for congestion, dental problem, ear pain, hearing loss and sneezing.  Negative for drooling, facial swelling, mouth sores, sore throat, trouble swallowing and voice change.   Eyes: Positive for pain, redness, itching and visual disturbance. Negative for discharge.  Respiratory: Positive for cough, shortness of breath and wheezing. Negative for choking.   Cardiovascular: Positive for chest pain and palpitations. Negative for leg swelling.  Gastrointestinal: Positive for abdominal pain. Negative for blood in stool, constipation, diarrhea and vomiting.  Endocrine: Positive for heat intolerance and polydipsia. Negative for cold intolerance.  Genitourinary: Positive for decreased urine volume and dysuria. Negative for hematuria.  Musculoskeletal: Positive for arthralgias, back pain and gait problem.   Skin: Negative for rash.  Allergic/Immunologic: Positive for environmental allergies.  Neurological: Positive for headaches. Negative for seizures, syncope and light-headedness.  Hematological: Negative for adenopathy.  Psychiatric/Behavioral: Positive for agitation and dysphoric mood. Negative for suicidal ideas. The patient is nervous/anxious.     Per HPI unless specifically indicated above     Objective:    BP 131/78 (BP Location: Right Arm, Patient Position: Sitting, Cuff Size: Normal)   Pulse (!) 58   Temp (!) 97.5 F (36.4 C)   Ht 5\' 11"  (1.803 m)   Wt 205 lb 12 oz (93.3 kg)   SpO2 96%   BMI 28.70 kg/m   Wt Readings from Last 3 Encounters:  12/05/17 205 lb 12 oz (93.3 kg)  09/18/17 210 lb (95.3 kg)  08/06/17 210 lb 8 oz (95.5 kg)    Physical Exam  Constitutional: He is oriented to person, place, and time. He appears well-developed and well-nourished.  HENT:  Head: Normocephalic and atraumatic.  Neck: Neck supple.  Cardiovascular: Normal rate and regular rhythm.  Pulmonary/Chest: Effort normal and breath sounds normal. He has no wheezes.  Abdominal: Soft. Bowel sounds are normal. There is no hepatosplenomegaly. There is no tenderness.  Musculoskeletal: He exhibits no edema.  Lymphadenopathy:    He has no cervical adenopathy.  Neurological: He is alert and oriented to person, place, and time.  Skin: Skin is warm and dry.  Psychiatric: He has a normal mood and affect. His behavior is normal.  Vitals reviewed.       Assessment & Plan:   Encounter Diagnoses  Name Primary?  [name] Uncontrolled diabetes mellitus with complications ([place]) Yes  . Chronic obstructive pulmonary disease, unspecified COPD type ([place])   . Essential hypertension   . Hyperlipidemia, unspecified hyperlipidemia type   . Arteriosclerotic cardiovascular disease (ASCVD)   . Paroxysmal atrial fibrillation (HCC)   . Chronic low back pain without sciatica, unspecified back pain laterality   .  Cigarette nicotine dependence with nicotine-induced disorder   . Anxiety and depression   . Personal history of noncompliance with medical treatment, presenting hazards to health      -Pt counseled that he needs to get fasting labs drawn this week.  We will call him with results -counseled pt that he needs to come in regularly to maintain best health possible -congratulated pt on reducing smoking.  Encouraged him to continue decreasing until he was a non-smoker -no changes to medications today -pt to follow up 3 months.  RTO sooner prn

## 2017-12-06 NOTE — Telephone Encounter (Signed)
Noted, no further recommendations. 

## 2017-12-17 ENCOUNTER — Telehealth: Payer: Self-pay | Admitting: Gastroenterology

## 2017-12-17 NOTE — Telephone Encounter (Signed)
Called pt, samples are ready for pickup.

## 2017-12-17 NOTE — Telephone Encounter (Signed)
Wife stopped by to see if we had any nexium samples. He will be out tonight and needs more. I told wife that DS was off and AM was at lunch and if she wanted to call or stop by later to follow up she could. Christy agreed.

## 2017-12-27 ENCOUNTER — Encounter (INDEPENDENT_AMBULATORY_CARE_PROVIDER_SITE_OTHER): Payer: Self-pay | Admitting: Orthopaedic Surgery

## 2017-12-27 ENCOUNTER — Ambulatory Visit (INDEPENDENT_AMBULATORY_CARE_PROVIDER_SITE_OTHER): Payer: Self-pay | Admitting: Orthopaedic Surgery

## 2017-12-27 VITALS — BP 139/81 | HR 60 | Ht 71.0 in | Wt 201.0 lb

## 2017-12-27 DIAGNOSIS — M7711 Lateral epicondylitis, right elbow: Secondary | ICD-10-CM

## 2017-12-27 DIAGNOSIS — M542 Cervicalgia: Secondary | ICD-10-CM | POA: Insufficient documentation

## 2017-12-27 NOTE — Progress Notes (Signed)
Office Visit Note   Patient: Richard [name]           Date of Birth: 1966-01-19           MRN: [id number] Visit Date: 12/27/2017              Requested by: [name], PA-C [address] [place], [place] [address] PCP: [name], PA-C   Assessment & Plan: Visit Diagnoses:  1. Neck pain   2. Lateral epicondylitis, right elbow     Plan: I discussed with the patient is likely that his pain is coming from his cervical spine and not recurrent lateral epicondylitis of his right elbow.  He has mechanical signs with brachial plexus tenderness and positive Spurling on the right more than left.  We will recheck him in 2 weeks if he is having persistent problems will consider diagnostic MRI scan cervical spine.  He is on Xarelto but has no evidence of myelopathy just a cervical hematoma.  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  Recheck 2 weeks.  Follow-Up Instructions: Return in about 2 weeks (around 01/10/2018).   Orders:  No orders of the defined types were placed in this encounter.  No orders of the defined types were placed in this encounter.     Procedures: No procedures performed   Clinical Data: No additional findings.   Subjective: Chief Complaint  Patient presents with  . Right Elbow - Pain    HPI 52 year old male returns with ongoing pain in his right elbow.  And an injection October 2018 which helped.  1 month ago he started having aching pain difficulty with numbness in his hand.  Pain radiates up to his shoulder is had some discomfort with rotation of his neck.  Patient has diabetes with A1c greater than 7.5.  Patient is on Xarelto and cannot take anti-inflammatories.  He is used a tennis elbow strap but states his elbow symptoms have persisted.  Review of Systems positive for anxiety, depression, hypertension.  Coronary artery disease TIA.  Hepatic fatty liver disease.  L4-5 disc bulge.  Type 2 diabetes on oral  medication.   Objective: Vital Signs: BP 139/81   Pulse 60   Ht 5\' 11"  (1.803 m)   Wt 201 lb (91.2 kg)   BMI 28.03 kg/m   Physical Exam  Constitutional: He is oriented to person, place, and time. He appears well-developed and well-nourished.  HENT:  Head: Normocephalic and atraumatic.  Eyes: Pupils are equal, round, and reactive to light. EOM are normal.  Neck: No tracheal deviation present. No thyromegaly present.  Cardiovascular: Normal rate.  Pulmonary/Chest: Effort normal. He has no wheezes.  Abdominal: Soft. Bowel sounds are normal.  Neurological: He is alert and oriented to person, place, and time.  Skin: Skin is warm and dry. Capillary refill takes less than 2 seconds.  Psychiatric: He has a normal mood and affect. His behavior is normal. Judgment and thought content normal.    Ortho Exam patient has brachial plexus tenderness more on the right than left positive Spurling on the right.  Upper extremity reflexes are 2+.  He has some tenderness over the lateral condyle right elbow not severe.  Specialty Comments:  No specialty comments available.  Imaging: No results found.   PMFS History: Patient Active Problem List   Diagnosis Date Noted  . Neck pain 12/27/2017  . Lateral epicondylitis, right elbow 05/10/2017  . Constipation 12/24/2015  . Dyspnea 11/17/2015  .  Coronary artery disease due to lipid rich plaque   . Heart palpitations 08/12/2015  . TIA (transient ischemic attack) 08/12/2015  . Colon cancer screening 08/02/2015  . Abdominal pain 12/18/2014  . Encounter for screening colonoscopy 12/18/2014  . Unspecified vitamin D deficiency 08/20/2012  . Arteriosclerotic cardiovascular disease (ASCVD) 04/11/2012  . Chronic low back pain   . Essential hypertension   . Anxiety and depression   . DIABETES MELLITUS-TYPE II 10/03/2010  . Cigarette smoker 09/14/2010  . CHRONIC OBSTRUCTIVE PULMONARY DISEASE 09/14/2010  . HLD (hyperlipidemia) 11/25/2009  .  GASTROESOPHAGEAL REFLUX DISEASE 04/05/2009  . Hepatic steatosis 04/05/2009   Past Medical History:  Diagnosis Date  . Arthritis    "legs, spine, shoulders" (08/12/2015)  . Asthma   . Chest pain    + palpitations; cath 2005- 30-40% mid LAD, 20% D1, 20% cx, OM, 20-30% RCA, and EF-55%  . Colitis 1990  . COPD (chronic obstructive pulmonary disease) ([place])   . Depression   . Dysrhythmia    AFib  . Gastric ulcer 2003; 2012   2003: + esophagitis; negative H.pylori serology  2012: Dr. Oneida Alar, mild gastritis, Bravo PH probe placement, negative H.pylori  . GERD (gastroesophageal reflux disease)   . Hepatic steatosis   . History of hiatal hernia   . Hyperlipemia   . Hypertension   . Overweight   . Panic attacks   . Paroxysmal atrial fibrillation (HCC)   . Stroke [facility])    mucsle in left side of face dosent work as its supposed to-only deficit.  [name] TIA (transient ischemic attack)    after cath 1/ 17  . Tobacco abuse    1/2 pack per day  . Type II diabetes mellitus (HCC)     Family History  Problem Relation Age of Onset  . Lung cancer Mother   . Alcohol abuse Mother   . Heart attack Father 70  . Diabetes Father   . Alcohol abuse Father   . Hypertension Brother   . Hypertension Brother   . Anxiety disorder Sister   . Depression Sister   . Anxiety disorder Sister   . Heart attack Brother 71  . Diabetes Brother   . Hypertension Brother   . Seizures Brother   . Dementia Paternal Uncle   . Dementia Cousin   . ADD / ADHD Daughter   . Colon cancer Neg Hx   . Drug abuse Neg Hx   . Bipolar disorder Neg Hx   . OCD Neg Hx   . Paranoid behavior Neg Hx   . Schizophrenia Neg Hx   . Sexual abuse Neg Hx   . Physical abuse Neg Hx     Past Surgical History:  Procedure Laterality Date  . BRAVO [place] STUDY  05/03/2011   IRC:VELF gastritis/normal esophagus and duodenum  . CARDIAC CATHETERIZATION  1990s X 1; 2005; 08/12/2015  . CARDIAC CATHETERIZATION N/A 08/12/2015   Procedure: Left Heart  Cath and Coronary Angiography;  Surgeon: Belva Crome, MD; LAD 20%, CFX 65%, RCA 20%, EF 60%   . COLONOSCOPY  1990  . COLONOSCOPY WITH PROPOFOL N/A 11/21/2016   Procedure: COLONOSCOPY WITH PROPOFOL;  Surgeon: [name], MD;  [facility];  Service: Endoscopy;  Laterality: N/A;  7:30AM  . ESOPHAGOGASTRODUODENOSCOPY  05/03/2011   [id number]:[id number] gastritis  . NECK MASS EXCISION Right    "done in dr's office; behind right ear/side of ncek"  . POLYPECTOMY  11/21/2016   Procedure: POLYPECTOMY;  Surgeon: [name], MD;  Location: [facility];  Service: Endoscopy;;  descending colon polyp  . SHOULDER ARTHROSCOPY W/ ROTATOR CUFF REPAIR Right 2006   acromioclavicular joint arthrosis   Social History   Occupational History  . Occupation: full time    Employer: UNEMPLOYED  Tobacco Use  . Smoking status: Current Every Day Smoker    Packs/day: 0.50    Years: 25.00    Pack years: 12.50    Types: Cigarettes    Start date: 07/17/1982  . Smokeless tobacco: Never Used  Substance and Sexual Activity  . Alcohol use: No    Alcohol/week: 0.0 oz  . Drug use: No  . Sexual activity: Yes    Birth control/protection: None

## 2017-12-31 ENCOUNTER — Encounter (INDEPENDENT_AMBULATORY_CARE_PROVIDER_SITE_OTHER): Payer: Self-pay | Admitting: Orthopaedic Surgery

## 2018-01-02 ENCOUNTER — Telehealth: Payer: Self-pay

## 2018-01-02 NOTE — Telephone Encounter (Signed)
REVIEWED-NO ADDITIONAL RECOMMENDATIONS. 

## 2018-01-02 NOTE — Telephone Encounter (Signed)
Pt's wife called for samples of nexium.  #20 tablets at front for pick up.

## 2018-01-10 ENCOUNTER — Ambulatory Visit (INDEPENDENT_AMBULATORY_CARE_PROVIDER_SITE_OTHER): Payer: Self-pay | Admitting: Orthopaedic Surgery

## 2018-01-10 ENCOUNTER — Encounter (INDEPENDENT_AMBULATORY_CARE_PROVIDER_SITE_OTHER): Payer: Self-pay | Admitting: Orthopaedic Surgery

## 2018-01-10 VITALS — BP 115/70 | HR 64 | Ht 71.0 in | Wt 199.0 lb

## 2018-01-10 DIAGNOSIS — M779 Enthesopathy, unspecified: Secondary | ICD-10-CM

## 2018-01-10 DIAGNOSIS — M5030 Other cervical disc degeneration, unspecified cervical region: Secondary | ICD-10-CM

## 2018-01-10 DIAGNOSIS — M778 Other enthesopathies, not elsewhere classified: Secondary | ICD-10-CM

## 2018-01-10 NOTE — Progress Notes (Signed)
Office Visit Note   Patient: Richard [name]           Date of Birth: 06/18/1966           MRN: [id number] Visit Date: 01/10/2018              Requested by: [name], PA-C [address] [place], [place] [address] PCP: [name], PA-C   Assessment & Plan: Visit Diagnoses:  1. Tendinitis of right triceps   2. Other cervical disc degeneration, unspecified cervical region     Plan: Patient currently has an elevated A1c is also taking Xarelto.  We discussed improved diabetic control and if he still having symptoms a few months then diagnostic imaging of the cervical spine versus his elbow for the triceps tendinopathy will be considered.  If he has persistent symptoms he can call for scheduling.  We discussed the importance of diabetic control.  Follow-Up Instructions: Return if symptoms worsen or fail to improve.   Orders:  No orders of the defined types were placed in this encounter.  No orders of the defined types were placed in this encounter.     Procedures: No procedures performed   Clinical Data: No additional findings.   Subjective: Chief Complaint  Patient presents with  . Neck - Pain, Follow-up  . Right Elbow - Pain, Follow-up    HPI 52 year old male returns with ongoing problems with neck pain primarily on the right side as well as right elbow pain.  Prednisone pack x6 days did not help much.  He states he got out in the sun got dehydrated.  He continues to have elbow pain particularly with pushing with his right arm.  Review of Systems 14 point review of systems updated from 12/27/2017 unchanged other than as mentioned in HPI.  Objective: Vital Signs: BP 115/70   Pulse 64   Ht 5\' 11"  (1.803 m)   Wt 199 lb (90.3 kg)   BMI 27.75 kg/m   Physical Exam  Constitutional: He is oriented to person, place, and time. He appears well-developed and well-nourished.  HENT:  Head: Normocephalic and atraumatic.  Eyes: Pupils are equal, round, and  reactive to light. EOM are normal.  Neck: No tracheal deviation present. No thyromegaly present.  Cardiovascular: Normal rate.  Pulmonary/Chest: Effort normal. He has no wheezes.  Abdominal: Soft. Bowel sounds are normal.  Neurological: He is alert and oriented to person, place, and time.  Skin: Skin is warm and dry. Capillary refill takes less than 2 seconds.  Psychiatric: He has a normal mood and affect. His behavior is normal. Judgment and thought content normal.    Ortho Exam patient has some tenderness with Tinel's and percussion over the right ulnar nerve without ulnar nerve subluxation.  He has tenderness of the distal triceps tendon without elbow effusion.  Mild pain with impingement more so on the right than left shoulder.  He has some brachial plexus tenderness both right and left.  Upper extremity reflexes are symmetrical.  Mild tenderness over the lateral epicondyle.  No pain with supination pronation.  Specialty Comments:  No specialty comments available.  Imaging: No results found.   PMFS History: Patient Active Problem List   Diagnosis Date Noted  . Other cervical disc degeneration, unspecified cervical region 01/11/2018  . Tendinitis of right triceps 01/11/2018  . Neck pain 12/27/2017  . Lateral epicondylitis, right elbow 05/10/2017  . Constipation 12/24/2015  . Dyspnea 11/17/2015  . Coronary artery disease due to lipid rich plaque   .  Heart palpitations 08/12/2015  . TIA (transient ischemic attack) 08/12/2015  . Colon cancer screening 08/02/2015  . Abdominal pain 12/18/2014  . Encounter for screening colonoscopy 12/18/2014  . Unspecified vitamin D deficiency 08/20/2012  . Arteriosclerotic cardiovascular disease (ASCVD) 04/11/2012  . Chronic low back pain   . Essential hypertension   . Anxiety and depression   . DIABETES MELLITUS-TYPE II 10/03/2010  . Cigarette smoker 09/14/2010  . CHRONIC OBSTRUCTIVE PULMONARY DISEASE 09/14/2010  . HLD (hyperlipidemia)  11/25/2009  . GASTROESOPHAGEAL REFLUX DISEASE 04/05/2009  . Hepatic steatosis 04/05/2009   Past Medical History:  Diagnosis Date  . Arthritis    "legs, spine, shoulders" (08/12/2015)  . Asthma   . Chest pain    + palpitations; cath 2005- 30-40% mid LAD, 20% D1, 20% cx, OM, 20-30% RCA, and EF-55%  . Colitis 1990  . COPD (chronic obstructive pulmonary disease) ([place])   . Depression   . Dysrhythmia    AFib  . Gastric ulcer 2003; 2012   2003: + esophagitis; negative H.pylori serology  2012: Dr. Oneida Alar, mild gastritis, Bravo PH probe placement, negative H.pylori  . GERD (gastroesophageal reflux disease)   . Hepatic steatosis   . History of hiatal hernia   . Hyperlipemia   . Hypertension   . Overweight   . Panic attacks   . Paroxysmal atrial fibrillation (HCC)   . Stroke [facility])    mucsle in left side of face dosent work as its supposed to-only deficit.  [name] TIA (transient ischemic attack)    after cath 1/ 17  . Tobacco abuse    1/2 pack per day  . Type II diabetes mellitus (HCC)     Family History  Problem Relation Age of Onset  . Lung cancer Mother   . Alcohol abuse Mother   . Heart attack Father 59  . Diabetes Father   . Alcohol abuse Father   . Hypertension Brother   . Hypertension Brother   . Anxiety disorder Sister   . Depression Sister   . Anxiety disorder Sister   . Heart attack Brother 5  . Diabetes Brother   . Hypertension Brother   . Seizures Brother   . Dementia Paternal Uncle   . Dementia Cousin   . ADD / ADHD Daughter   . Colon cancer Neg Hx   . Drug abuse Neg Hx   . Bipolar disorder Neg Hx   . OCD Neg Hx   . Paranoid behavior Neg Hx   . Schizophrenia Neg Hx   . Sexual abuse Neg Hx   . Physical abuse Neg Hx     Past Surgical History:  Procedure Laterality Date  . BRAVO [place] STUDY  05/03/2011   OZD:GUYQ gastritis/normal esophagus and duodenum  . CARDIAC CATHETERIZATION  1990s X 1; 2005; 08/12/2015  . CARDIAC CATHETERIZATION N/A 08/12/2015   Procedure:  Left Heart Cath and Coronary Angiography;  Surgeon: Belva Crome, MD; LAD 20%, CFX 65%, RCA 20%, EF 60%   . COLONOSCOPY  1990  . COLONOSCOPY WITH PROPOFOL N/A 11/21/2016   Procedure: COLONOSCOPY WITH PROPOFOL;  Surgeon: [name], MD;  [facility];  Service: Endoscopy;  Laterality: N/A;  7:30AM  . ESOPHAGOGASTRODUODENOSCOPY  05/03/2011   IHK:VQQV gastritis  . NECK MASS EXCISION Right    "done in dr's office; behind right ear/side of ncek"  . POLYPECTOMY  11/21/2016   Procedure: POLYPECTOMY;  Surgeon: [name], MD;  [facility];  Service: Endoscopy;;  descending colon  polyp  . SHOULDER ARTHROSCOPY W/ ROTATOR CUFF REPAIR Right 2006   acromioclavicular joint arthrosis   Social History   Occupational History  . Occupation: full time    Employer: UNEMPLOYED  Tobacco Use  . Smoking status: Current Every Day Smoker    Packs/day: 0.50    Years: 25.00    Pack years: 12.50    Types: Cigarettes    Start date: 07/17/1982  . Smokeless tobacco: Never Used  Substance and Sexual Activity  . Alcohol use: No    Alcohol/week: 0.0 oz  . Drug use: No  . Sexual activity: Yes    Birth control/protection: None

## 2018-01-11 ENCOUNTER — Encounter (INDEPENDENT_AMBULATORY_CARE_PROVIDER_SITE_OTHER): Payer: Self-pay | Admitting: Orthopaedic Surgery

## 2018-01-11 DIAGNOSIS — M779 Enthesopathy, unspecified: Principal | ICD-10-CM

## 2018-01-11 DIAGNOSIS — M5030 Other cervical disc degeneration, unspecified cervical region: Secondary | ICD-10-CM | POA: Insufficient documentation

## 2018-01-11 DIAGNOSIS — M778 Other enthesopathies, not elsewhere classified: Secondary | ICD-10-CM | POA: Insufficient documentation

## 2018-01-14 ENCOUNTER — Other Ambulatory Visit: Payer: Self-pay | Admitting: Cardiovascular Disease

## 2018-01-16 ENCOUNTER — Other Ambulatory Visit: Payer: Self-pay | Admitting: Cardiovascular Disease

## 2018-01-16 MED ORDER — AMLODIPINE BESYLATE 5 MG PO TABS: 5.0000 mg | ORAL_TABLET | Freq: Every day | ORAL | 3 refills | Status: DC

## 2018-01-16 MED ORDER — XARELTO 20 MG PO TABS: ORAL_TABLET | ORAL | 3 refills | Status: DC

## 2018-01-16 MED ORDER — METOPROLOL SUCCINATE ER 50 MG PO TB24: ORAL_TABLET | ORAL | 3 refills | Status: DC

## 2018-01-16 MED ORDER — METOPROLOL SUCCINATE ER 50 MG PO TB24: 75.0000 mg | ORAL_TABLET | Freq: Every day | ORAL | 3 refills | Status: DC

## 2018-01-16 MED ORDER — NITROGLYCERIN 0.4 MG SL SUBL: 0.4000 mg | SUBLINGUAL_TABLET | SUBLINGUAL | 3 refills | Status: DC | PRN

## 2018-01-16 NOTE — Telephone Encounter (Signed)
Needs all cardiac meds sent to  [facility]

## 2018-01-24 ENCOUNTER — Encounter: Payer: Self-pay | Admitting: Physician Assistant

## 2018-01-24 ENCOUNTER — Ambulatory Visit: Payer: Self-pay | Admitting: Physician Assistant

## 2018-01-24 ENCOUNTER — Other Ambulatory Visit (HOSPITAL_COMMUNITY)
Admission: RE | Admit: 2018-01-24 | Discharge: 2018-01-24 | Disposition: A | Discharge status: Home / Self Care | Payer: Self-pay | Source: Ambulatory Visit | Attending: Physician Assistant | Admitting: Physician Assistant

## 2018-01-24 VITALS — BP 125/75 | HR 60 | Temp 97.7°F | Ht 71.0 in | Wt 205.0 lb

## 2018-01-24 DIAGNOSIS — I10 Essential (primary) hypertension: Secondary | ICD-10-CM

## 2018-01-24 DIAGNOSIS — I480 Paroxysmal atrial fibrillation: Secondary | ICD-10-CM

## 2018-01-24 DIAGNOSIS — E785 Hyperlipidemia, unspecified: Secondary | ICD-10-CM | POA: Insufficient documentation

## 2018-01-24 DIAGNOSIS — IMO0002 Reserved for concepts with insufficient information to code with codable children: Secondary | ICD-10-CM

## 2018-01-24 DIAGNOSIS — E118 Type 2 diabetes mellitus with unspecified complications: Secondary | ICD-10-CM | POA: Insufficient documentation

## 2018-01-24 DIAGNOSIS — Z9119 Patient's noncompliance with other medical treatment and regimen: Secondary | ICD-10-CM

## 2018-01-24 DIAGNOSIS — I2510 Atherosclerotic heart disease of native coronary artery without angina pectoris: Secondary | ICD-10-CM

## 2018-01-24 DIAGNOSIS — Z91199 Patient's noncompliance with other medical treatment and regimen due to unspecified reason: Secondary | ICD-10-CM

## 2018-01-24 DIAGNOSIS — E1165 Type 2 diabetes mellitus with hyperglycemia: Secondary | ICD-10-CM

## 2018-01-24 DIAGNOSIS — F17219 Nicotine dependence, cigarettes, with unspecified nicotine-induced disorders: Secondary | ICD-10-CM

## 2018-01-24 DIAGNOSIS — R21 Rash and other nonspecific skin eruption: Secondary | ICD-10-CM

## 2018-01-24 LAB — COMPREHENSIVE METABOLIC PANEL
ALBUMIN: 3.7 g/dL (ref 3.5–5.0)
ALK PHOS: 52 U/L (ref 38–126)
ALT: 28 U/L (ref 0–44)
AST: 18 U/L (ref 15–41)
Anion gap: 7 (ref 5–15)
BILIRUBIN TOTAL: 0.4 mg/dL (ref 0.3–1.2)
BUN: 12 mg/dL (ref 6–20)
CALCIUM: 8.8 mg/dL — AB (ref 8.9–10.3)
CO2: 28 mmol/L (ref 22–32)
CREATININE: 0.88 mg/dL (ref 0.61–1.24)
Chloride: 104 mmol/L (ref 98–111)
GFR calc Af Amer: >60 mL/min (ref >60)
GLUCOSE: 213 mg/dL — AB (ref 70–99)
Potassium: 4.2 mmol/L (ref 3.5–5.1)
Sodium: 139 mmol/L (ref 135–145)
Total Protein: 6.9 g/dL (ref 6.5–8.1)

## 2018-01-24 LAB — CBC
HEMATOCRIT: 45.7 % (ref 39.0–52.0)
HEMOGLOBIN: 15.9 g/dL (ref 13.0–17.0)
MCH: 32 pg (ref 26.0–34.0)
MCHC: 34.8 g/dL (ref 30.0–36.0)
MCV: 92 fL (ref 78.0–100.0)
Platelets: 209 10*3/uL (ref 150–400)
RBC: 4.97 MIL/uL (ref 4.22–5.81)
RDW: 12.3 % (ref 11.5–15.5)
WBC: 5.9 10*3/uL (ref 4.0–10.5)

## 2018-01-24 LAB — LIPID PANEL
CHOLESTEROL: 126 mg/dL (ref 0–200)
HDL: 30 mg/dL — ABNORMAL LOW (ref >40)
LDL Cholesterol: 74 mg/dL (ref 0–99)
Total CHOL/HDL Ratio: 4.2 RATIO
Triglycerides: 111 mg/dL (ref <150)
VLDL: 22 mg/dL (ref 0–40)

## 2018-01-24 LAB — HEMOGLOBIN A1C
HEMOGLOBIN A1C: 9.1 % — AB (ref 4.8–5.6)
MEAN PLASMA GLUCOSE: 214.47 mg/dL

## 2018-01-24 LAB — PROTIME-INR
INR: 1.83
Prothrombin Time: 21 seconds — ABNORMAL HIGH (ref 11.4–15.2)

## 2018-01-24 NOTE — Progress Notes (Signed)
BP 125/75 (BP Location: Left Arm, Patient Position: Sitting, Cuff Size: Normal)   Pulse 60   Temp 97.7 F (36.5 C) (Other (Comment))   Ht 5\' 11"  (1.803 m)   Wt 205 lb (93 kg)   SpO2 97%   BMI 28.59 kg/m    Subjective:    Patient ID: Richard [name], male    DOB: 22-Jul-1965, 52 y.o.   MRN: [id number]  HPI: Richard [name] is a 52 y.o. male presenting on 01/24/2018 for Rash (to both feet)   HPI   Pt noticed rash Tuesday.  No itching.  No pain.    Pt never got labs done- ordered February and pt reminded several times.  Pt feeling like his usual.  No worsening sob or cp.   Pt says he finally got approved [facility].   Relevant past medical, surgical, family and social history reviewed and updated as indicated. Interim medical history since our last visit reviewed. Allergies and medications reviewed and updated.   Current Outpatient Medications:  .  acetaminophen (TYLENOL) 325 MG tablet, Take 650 mg by mouth every 6 (six) hours as needed for pain or fever. , Disp: , Rfl:  .  albuterol (VENTOLIN HFA) 108 (90 BASE) MCG/ACT inhaler, Inhale 2 puffs into the lungs every 6 (six) hours as needed for wheezing or shortness of breath. , Disp: , Rfl:  .  ALPRAZolam (XANAX) 0.5 MG tablet, Take one tablet by mouth three times a day and two tablets at night, Disp: 150 tablet, Rfl: 2 .  amLODipine (NORVASC) 5 MG tablet, Take 1 tablet (5 mg total) by mouth daily., Disp: 90 tablet, Rfl: 3 .  budesonide-formoterol (SYMBICORT) 80-4.5 MCG/ACT inhaler, Inhale 2 puffs into the lungs 2 (two) times daily as needed. , Disp: , Rfl:  .  calcium carbonate (TUMS - DOSED IN MG ELEMENTAL CALCIUM) 500 MG chewable tablet, Chew 1 tablet by mouth daily as needed for indigestion or heartburn., Disp: , Rfl:  .  cyclobenzaprine (FLEXERIL) 10 MG tablet, Take 1 tablet (10 mg total) by mouth 3 (three) times daily. (Patient taking differently: Take 10 mg by mouth 3 (three) times daily as needed for muscle spasms.  ), Disp: 21 tablet, Rfl: 0 .  esomeprazole (NEXIUM) 20 MG capsule, Take 40 mg by mouth at bedtime., Disp: , Rfl:  .  HYDROcodone-acetaminophen (NORCO/VICODIN) 5-325 MG tablet, Take one to two tablets every 4-6 hours as needed for pain, Disp: 40 tablet, Rfl: 0 .  metFORMIN (GLUCOPHAGE) 500 MG tablet, Take 500 mg by mouth daily., Disp: , Rfl:  .  metoprolol succinate (TOPROL-XL) 50 MG 24 hr tablet, Take 75 mg by mouth daily. Take with or immediately following a meal., Disp: 135 tablet, Rfl: 3 .  nitroGLYCERIN (NITROSTAT) 0.4 MG SL tablet, Place 1 tablet (0.4 mg total) under the tongue every 5 (five) minutes x 3 doses as needed for chest pain (if no relief after 3rd dose, proceed to the ED for an evaluation)., Disp: 25 tablet, Rfl: 3 .  simvastatin (ZOCOR) 20 MG tablet, TAKE 1 TABLET BY MOUTH AT BEDTIME, Disp: 30 tablet, Rfl: 4 .  XARELTO 20 MG TABS tablet, TAKE 1 Tablet BY MOUTH ONCE DAILY WITH SUPPER, Disp: 90 tablet, Rfl: 3  Review of Systems  Constitutional: Positive for diaphoresis, fatigue and unexpected weight change. Negative for appetite change, chills and fever.  HENT: Positive for congestion, dental problem and sneezing. Negative for drooling, ear pain, facial swelling, hearing loss, mouth sores,  sore throat, trouble swallowing and voice change.   Eyes: Negative for pain, discharge, redness, itching and visual disturbance.  Respiratory: Positive for cough, shortness of breath and wheezing. Negative for choking.   Cardiovascular: Positive for chest pain and palpitations. Negative for leg swelling.  Gastrointestinal: Negative for abdominal pain, blood in stool, constipation, diarrhea and vomiting.  Endocrine: Positive for cold intolerance and heat intolerance. Negative for polydipsia.  Genitourinary: Negative for decreased urine volume, dysuria and hematuria.  Musculoskeletal: Positive for arthralgias, back pain and gait problem.  Skin: Positive for rash.  Allergic/Immunologic: Positive for  environmental allergies.  Neurological: Positive for light-headedness and headaches. Negative for seizures and syncope.  Hematological: Negative for adenopathy.  Psychiatric/Behavioral: Positive for agitation and dysphoric mood. Negative for suicidal ideas. The patient is nervous/anxious.     Per HPI unless specifically indicated above     Objective:    BP 125/75 (BP Location: Left Arm, Patient Position: Sitting, Cuff Size: Normal)   Pulse 60   Temp 97.7 F (36.5 C) (Other (Comment))   Ht 5\' 11"  (1.803 m)   Wt 205 lb (93 kg)   SpO2 97%   BMI 28.59 kg/m   Wt Readings from Last 3 Encounters:  01/24/18 205 lb (93 kg)  01/10/18 199 lb (90.3 kg)  12/27/17 201 lb (91.2 kg)    Physical Exam  Constitutional: He is oriented to person, place, and time. He appears well-developed and well-nourished.  HENT:  Head: Normocephalic and atraumatic.  Mouth/Throat: Oropharynx is clear and moist.  Neck: Neck supple.  Cardiovascular: Normal rate and regular rhythm.  Pulmonary/Chest: Effort normal and breath sounds normal. He has no wheezes.  Abdominal: Soft. Bowel sounds are normal. There is no hepatosplenomegaly. There is no tenderness.  Musculoskeletal: He exhibits no edema.       Feet:  Some hyperpigmentation spots on dorsum bilateral feet and some around the ankle.  There are no petechiae.  There is no bruising.  There is no signs bleeding.   Lymphadenopathy:    He has no cervical adenopathy.  Neurological: He is alert and oriented to person, place, and time.  Skin: Skin is warm and dry.  Psychiatric: He has a normal mood and affect. His behavior is normal.  Vitals reviewed.       Assessment & Plan:   Encounter Diagnoses  Name Primary?  . Rash and nonspecific skin eruption Yes  . Uncontrolled diabetes mellitus with complications ([place])   . Essential hypertension   . Hyperlipidemia, unspecified hyperlipidemia type   . Paroxysmal atrial fibrillation (HCC)   . Arteriosclerotic  cardiovascular disease (ASCVD)   . Cigarette nicotine dependence with nicotine-induced disorder   . Personal history of noncompliance with medical treatment, presenting hazards to health      -Pt urged to get labs done.  He has routine follow up here next month -encouraged him to get his ablation arranged with cardiology now that he has been approved [facility].   -discussed with pt that his "rash" looks fine today but he needs to keep an eye on it and RTO if it progresses or worsens or if he has any signs of bleeding.   Discussed with pt that his xarelto needs to be continued until he gets his ablation done which is why he needs to call and get that scheduled.  He states understanding.  -follow up next month as scheduled.  RTO sooner prn as discussed

## 2018-01-25 ENCOUNTER — Encounter (HOSPITAL_COMMUNITY): Payer: Self-pay | Admitting: Psychiatry

## 2018-01-25 ENCOUNTER — Ambulatory Visit (INDEPENDENT_AMBULATORY_CARE_PROVIDER_SITE_OTHER): Payer: Self-pay | Admitting: Psychiatry

## 2018-01-25 VITALS — BP 133/78 | HR 62 | Ht 71.0 in | Wt 205.0 lb

## 2018-01-25 DIAGNOSIS — F4001 Agoraphobia with panic disorder: Secondary | ICD-10-CM

## 2018-01-25 MED ORDER — ALPRAZOLAM 0.5 MG PO TABS: ORAL_TABLET | ORAL | 2 refills | Status: DC

## 2018-01-25 NOTE — Progress Notes (Signed)
[place] MD/PA/NP OP Progress Note  01/25/2018 11:48 AM Richard [name]  MRN:  [id number]  Chief Complaint:  Chief Complaint    Anxiety; Follow-up     HPI: This patient is a 52 year old Caucasian male who came for his followup appointment. He is currently living with his wife in [place]. He is unemployed and applying for disability.  The patient states that he has been employed for 5 years. 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 He got injured on the job and both shoulders were torn. He has not been able to work ever since and he feels very badly about this. 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  Since he's not been able to work the patient has been having increasingly depressed and anxious. He feels like his body gives out when he is trying to exert himself even in a minor way.  Patient returns after about 6 months.  He states he missed some appointments because he could not afford to come.  He is now been approved for [facility] health charity care so he will be able to follow-up more closely with me and his other doctors.  He states that he did have enough Xanax to get him through although he did not take as many per day as previously.  He still very anxious and very worried about his financial issues.  He has to pay out of pocket for medications which makes it difficult.  [facility] and had lab work done yesterday and his A1c is 9.1 and blood sugar 230 so he needs to get this under control.  He also is still smoking half a pack a day.  He complains of severe fatigue "dehydration" whenever he goes outside.  He still feels very anxious but does not want to try anything other than Xanax.  He has been told to schedule cardiac ablation but he is "scared to."  I told him the fluttering in his chest would get a lot better if he went through with it and perhaps this would lower his anxiety  Visit  Diagnosis:    ICD-10-CM   1. 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 Arthritis    "legs, spine, shoulders" (08/12/2015)  . Asthma   . Chest pain    + palpitations; cath 2005- 30-40% mid LAD, 20% D1, 20% cx, OM, 20-30% RCA, and EF-55%  . Colitis 1990  . COPD (chronic obstructive pulmonary disease) ([place])   . Depression   . Dysrhythmia    AFib  . Gastric ulcer 2003; 2012   2003: + esophagitis; negative H.pylori serology  2012: Dr. Oneida Alar, mild gastritis, Bravo PH probe placement, negative H.pylori  . GERD (gastroesophageal reflux disease)   . Hepatic steatosis   . History of hiatal hernia   . Hyperlipemia   . Hypertension   . Overweight   . Panic attacks   . Paroxysmal atrial fibrillation (HCC)   . Stroke [facility])    mucsle in left side of face dosent work as its supposed to-only deficit.  [name] TIA (transient ischemic attack)    after cath 1/ 17  . Tobacco abuse    1/2 pack per day  . Type II diabetes mellitus ([place])  Past Surgical History:  Procedure Laterality Date  . BRAVO [place] STUDY  05/03/2011   QVZ:DGLO gastritis/normal esophagus and duodenum  . CARDIAC CATHETERIZATION  1990s X 1; 2005; 08/12/2015  . CARDIAC CATHETERIZATION N/A 08/12/2015   Procedure: Left Heart Cath and Coronary Angiography;  Surgeon: Belva Crome, MD; LAD 20%, CFX 65%, RCA 20%, EF 60%   . COLONOSCOPY  1990  . COLONOSCOPY WITH PROPOFOL N/A 11/21/2016   Procedure: COLONOSCOPY WITH PROPOFOL;  Surgeon: [name], MD;  [facility];  Service: Endoscopy;  Laterality: N/A;  7:30AM  . ESOPHAGOGASTRODUODENOSCOPY  05/03/2011   VFI:EPPI gastritis  . NECK MASS EXCISION Right    "done in dr's office; behind right ear/side of ncek"  . POLYPECTOMY  11/21/2016   Procedure: POLYPECTOMY;  Surgeon: [name], MD;  Location: [facility];  Service: Endoscopy;;  descending colon  polyp  . SHOULDER ARTHROSCOPY W/ ROTATOR CUFF REPAIR Right 2006   acromioclavicular joint arthrosis    Family Psychiatric History: See below  Family History:  Family History  Problem Relation Age of Onset  . Lung cancer Mother   . Alcohol abuse Mother   . Heart attack Father 39  . Diabetes Father   . Alcohol abuse Father   . Hypertension Brother   . Hypertension Brother   . Anxiety disorder Sister   . Depression Sister   . Anxiety disorder Sister   . Heart attack Brother 97  . Diabetes Brother   . Hypertension Brother   . Seizures Brother   . Dementia Paternal Uncle   . Dementia Cousin   . ADD / ADHD Daughter   . Colon cancer Neg Hx   . Drug abuse Neg Hx   . Bipolar disorder Neg Hx   . OCD Neg Hx   . Paranoid behavior Neg Hx   . Schizophrenia Neg Hx   . Sexual abuse Neg Hx   . Physical abuse Neg Hx     Social History:  Social History   Socioeconomic History  . Marital status: Married    Spouse name: Not on file  . Number of children: Not on file  . Years of education: Not on file  . Highest education level: Not on file  Occupational History  . Occupation: full time    Employer: UNEMPLOYED  Social Needs  . Financial resource strain: Not on file  . Food insecurity:    Worry: Not on file    Inability: Not on file  . Transportation needs:    Medical: Not on file    Non-medical: Not on file  Tobacco Use  . Smoking status: Current Every Day Smoker    Packs/day: 0.50    Years: 25.00    Pack years: 12.50    Types: Cigarettes    Start date: 07/17/1982  . Smokeless tobacco: Never Used  Substance and Sexual Activity  . Alcohol use: No    Alcohol/week: 0.0 oz  . Drug use: No  . Sexual activity: Yes    Birth control/protection: None  Lifestyle  . Physical activity:    Days per week: Not on file    Minutes per session: Not on file  . Stress: Not on file  Relationships  . Social connections:    Talks on phone: Not on file    Gets together: Not on file     Attends religious service: Not on file    Active member of club or organization: Not on file  Attends meetings of clubs or organizations: Not on file    Relationship status: Not on file  Other Topics Concern  . Not on file  Social History Narrative   Pt lives in [place] with wife.  5 children.  Unemployed due to panic attacks and back pain    Allergies:  Allergies  Allergen Reactions  . Dexilant [Dexlansoprazole] Anaphylaxis  . Mushroom Ext Cmplx(Shiitake-Reishi-Mait) Anaphylaxis    Rapid heart rate.  . Penicillins Anaphylaxis    Has patient had a PCN reaction causing immediate rash, facial/tongue/throat swelling, SOB or lightheadedness with hypotension: Yes Has patient had a PCN reaction causing severe rash involving mucus membranes or skin necrosis: No Has patient had a PCN reaction that required hospitalization Yes Has patient had a PCN reaction occurring within the last 10 years: No If all of the above answers are "NO", then may proceed with Cephalosporin use.   [name] Doxycycline     Metabolic Disorder Labs: Lab Results  Component Value Date   HGBA1C 9.1 (H) 01/24/2018   MPG 214.47 01/24/2018   MPG 205.86 05/29/2017   No results found for: PROLACTIN Lab Results  Component Value Date   CHOL 126 01/24/2018   TRIG 111 01/24/2018   HDL 30 (L) 01/24/2018   CHOLHDL 4.2 01/24/2018   VLDL 22 01/24/2018   LDLCALC 74 01/24/2018   LDLCALC 91 05/29/2017   Lab Results  Component Value Date   TSH 0.958 04/21/2011   TSH 1.402 05/27/2009    Therapeutic Level Labs: No results found for: LITHIUM No results found for: VALPROATE No components found for:  CBMZ  Current Medications: Current Outpatient Medications  Medication Sig Dispense Refill  . acetaminophen (TYLENOL) 325 MG tablet Take 650 mg by mouth every 6 (six) hours as needed for pain or fever.     [name] albuterol (VENTOLIN HFA) 108 (90 BASE) MCG/ACT inhaler Inhale 2 puffs into the lungs every 6 (six) hours as needed  for wheezing or shortness of breath.     . ALPRAZolam (XANAX) 0.5 MG tablet Take one tablet by mouth three times a day and two tablets at night 150 tablet 2  . amLODipine (NORVASC) 5 MG tablet Take 1 tablet (5 mg total) by mouth daily. 90 tablet 3  . budesonide-formoterol (SYMBICORT) 80-4.5 MCG/ACT inhaler Inhale 2 puffs into the lungs 2 (two) times daily as needed.     . calcium carbonate (TUMS - DOSED IN MG ELEMENTAL CALCIUM) 500 MG chewable tablet Chew 1 tablet by mouth daily as needed for indigestion or heartburn.    . cyclobenzaprine (FLEXERIL) 10 MG tablet Take 1 tablet (10 mg total) by mouth 3 (three) times daily. (Patient taking differently: Take 10 mg by mouth 3 (three) times daily as needed for muscle spasms. ) 21 tablet 0  . esomeprazole (NEXIUM) 20 MG capsule Take 40 mg by mouth at bedtime.    [name] HYDROcodone-acetaminophen (NORCO/VICODIN) 5-325 MG tablet Take one to two tablets every 4-6 hours as needed for pain 40 tablet 0  . metFORMIN (GLUCOPHAGE) 500 MG tablet Take 500 mg by mouth daily.    . metoprolol succinate (TOPROL-XL) 50 MG 24 hr tablet Take 75 mg by mouth daily. Take with or immediately following a meal. 135 tablet 3  . nitroGLYCERIN (NITROSTAT) 0.4 MG SL tablet Place 1 tablet (0.4 mg total) under the tongue every 5 (five) minutes x 3 doses as needed for chest pain (if no relief after 3rd dose, proceed to the ED for an evaluation). 25  tablet 3  . simvastatin (ZOCOR) 20 MG tablet TAKE 1 TABLET BY MOUTH AT BEDTIME 30 tablet 4  . XARELTO 20 MG TABS tablet TAKE 1 Tablet BY MOUTH ONCE DAILY WITH SUPPER 90 tablet 3   No current facility-administered medications for this visit.      Musculoskeletal: Strength & Muscle Tone: within normal limits Gait & Station: normal Patient leans: N/A  Psychiatric Specialty Exam: Review of Systems  Cardiovascular: Positive for palpitations.  Musculoskeletal: Positive for back pain.  Psychiatric/Behavioral: The patient is nervous/anxious.    All other systems reviewed and are negative.   Blood pressure 133/78, pulse 62, height 5\' 11"  (1.803 m), weight 205 lb (93 kg), SpO2 98 %.Body mass index is 28.59 kg/m.  General Appearance: Casual and Fairly Groomed  Eye Contact:  Good  Speech:  Clear and Coherent  Volume:  Normal  Mood:  Anxious  Affect:  Congruent  Thought Process:  Goal Directed  Orientation:  Full (Time, Place, and Person)  Thought Content: Rumination   Suicidal Thoughts:  No  Homicidal Thoughts:  No  Memory:  Immediate;   Good Recent;   Good Remote;   Fair  Judgement:  Fair  Insight:  Lacking  Psychomotor Activity:  Decreased  Concentration:  Concentration: Good and Attention Span: Good  Recall:  Good  Fund of Knowledge: Fair  Language: Good  Akathisia:  No  Handed:  Right  AIMS (if indicated): not done  Assets:  Communication Skills Desire for Improvement Resilience Social Support Talents/Skills  ADL's:  Intact  Cognition: WNL  Sleep:  Fair   Screenings: PHQ2-9     Patient Outreach Telephone from 08/30/2015 in [facility] Patient Outreach Telephone from 08/26/2015 in [facility]  PHQ-2 Total Score  2  4  PHQ-9 Total Score  8  8       Assessment and Plan: Patient is a 52 year old male with a history of significant medical issues and severe financial problems.  He is trying to get his life and better arrangement financially so he can afford to get more medical treatment.  For now he would like to continue the Xanax 0.5 mg 3 times daily and 1 mg at bedtime.  He has tried SSRIs and they always seem to make him worse.  He will return to see me in 3 months   [name], MD 01/25/2018, 11:48 AM

## 2018-01-29 ENCOUNTER — Encounter: Payer: Self-pay | Admitting: Physician Assistant

## 2018-01-29 NOTE — Progress Notes (Addendum)
Cardiology Office Note    Date:  01/31/2018  ID:  Richard [name], DOB 07-Feb-1966, MRN [id number] PCP:  [name], PA-C  Cardiologist:  Kate Sable, MD  Chief Complaint: swelling  History of Present Illness:  Richard [name] is a 52 y.o. male with history of nonobstructive CAD (last assessed 2017), PAF, TIA after cath 07/2015, tobacco abuse, DM, arthritis, asthma, COPD, depression, colitis, remote gastric ulcer, gastritis, GERD, hepatic steatosis, hiatal hernia, HTN, HLD (followed by PCP), overweight, panic attacks who presents for evaluation of swelling.  To recap history, cardiac cath 07/2015 showed 65% distal Cx, 20% mid-distal LAD, 20% prox-distal RCA, EF 60%, EDP 54mmHg. This was complicated by post-cath TIA. 2D echo 07/2015 EF 60-65%, moderate focal basal hypertrophy of the septum and mild hypertrophy of the posterior wall, normal diastolic function, + atrial septal aneurysm, elevated CVP. Per. Dr. Bronson Ing he previously failed flecainide and metoprolol. He saw EP 03/2017 and an ablation was planned but delayed due to finanical reasons. He was last seen by Dr. Bronson Ing 09/2017 with atypical left sided neck pains and left arm weakness. Symptoms were not felt cardiac in nature.  The patient has a history of severe anxiety as well as depression and is followed by behavioral health. He has numerous somatic complaints today including R elbow pain, episodic leg pain (both at rest and stabbing while working), intolerance to the heat, & lightheadedness upon standing. He states he cannot work because of all of these things. He states he saw his psychiatrist recently who noted LEE and asked him to f/u with cardiology. I do not see anything aout this mentioned in their recent note. What he describes sounds like some sockline edema that happens at the end of the day. It is not present on exam today. He complains of significant daytime fatigue. His ex-wife told him he snores and stops breathing  when he sleeps. Sleep study was previously recommended but he never attended. Afib seems overall a lesser issue since stopping the flecaininde remotely, but still happens at times very briefly during the day. He's had a few rare evening episodes lasting longer. He remains nervous about proceeding with ablation but feels he might benefit from it. Recent labs 01/24/18: K 4.2, BUN 12, Cr 0.88, LFTS OK, LDL 74, A1c 9.1, CBC wnl.  No CP reported.   Past Medical History:  Diagnosis Date  . Arthritis    "legs, spine, shoulders" (08/12/2015)  . Asthma   . Colitis 1990  . COPD (chronic obstructive pulmonary disease) ([place])   . Depression   . Gastric ulcer 2003; 2012   2003: + esophagitis; negative H.pylori serology  2012: Dr. Oneida Alar, mild gastritis, Bravo PH probe placement, negative H.pylori  . GERD (gastroesophageal reflux disease)   . Hepatic steatosis   . History of hiatal hernia   . Hyperlipemia   . Hypertension   . Mild CAD    a. Cardiac cath 07/2015 showed 65% distal Cx, 20% mid-distal LAD, 20% prox-distal RCA, EF 60%, EDP 64mmHg.  [name] Overweight   . Panic attacks   . Paroxysmal atrial fibrillation (HCC)   . Stroke [facility])    mucsle in left side of face dosent work as its supposed to-only deficit.  [name] TIA (transient ischemic attack)    after cath 1/ 17  . Tobacco abuse    1/2 pack per day  . Type II diabetes mellitus ([place])     Past Surgical History:  Procedure Laterality Date  . BRAVO PH STUDY  05/03/2011   UUV:OZDG gastritis/normal esophagus and duodenum  . CARDIAC CATHETERIZATION  1990s X 1; 2005; 08/12/2015  . CARDIAC CATHETERIZATION N/A 08/12/2015   Procedure: Left Heart Cath and Coronary Angiography;  Surgeon: Belva Crome, MD; LAD 20%, CFX 65%, RCA 20%, EF 60%   . COLONOSCOPY  1990  . COLONOSCOPY WITH PROPOFOL N/A 11/21/2016   Procedure: COLONOSCOPY WITH PROPOFOL;  Surgeon: [name], MD;  [facility];  Service: Endoscopy;  Laterality: N/A;  7:30AM  .  ESOPHAGOGASTRODUODENOSCOPY  05/03/2011   UYQ:IHKV gastritis  . NECK MASS EXCISION Right    "done in dr's office; behind right ear/side of ncek"  . POLYPECTOMY  11/21/2016   Procedure: POLYPECTOMY;  Surgeon: [name], MD;  [facility];  Service: Endoscopy;;  descending colon polyp  . SHOULDER ARTHROSCOPY W/ ROTATOR CUFF REPAIR Right 2006   acromioclavicular joint arthrosis    Current Medications: Current Meds  Medication Sig  . acetaminophen (TYLENOL) 325 MG tablet Take 650 mg by mouth every 6 (six) hours as needed for pain or fever.   [name] albuterol (VENTOLIN HFA) 108 (90 BASE) MCG/ACT inhaler Inhale 2 puffs into the lungs every 6 (six) hours as needed for wheezing or shortness of breath.   . ALPRAZolam (XANAX) 0.5 MG tablet Take one tablet by mouth three times a day and two tablets at night  . amLODipine (NORVASC) 5 MG tablet Take 1 tablet (5 mg total) by mouth daily.  . budesonide-formoterol (SYMBICORT) 80-4.5 MCG/ACT inhaler Inhale 2 puffs into the lungs 2 (two) times daily as needed.   . calcium carbonate (TUMS - DOSED IN MG ELEMENTAL CALCIUM) 500 MG chewable tablet Chew 1 tablet by mouth daily as needed for indigestion or heartburn.  . cyclobenzaprine (FLEXERIL) 10 MG tablet Take 1 tablet (10 mg total) by mouth 3 (three) times daily. (Patient taking differently: Take 10 mg by mouth 3 (three) times daily as needed for muscle spasms. )  . esomeprazole (NEXIUM) 20 MG capsule Take 40 mg by mouth at bedtime.  [name] HYDROcodone-acetaminophen (NORCO/VICODIN) 5-325 MG tablet Take one to two tablets every 4-6 hours as needed for pain  . metFORMIN (GLUCOPHAGE) 500 MG tablet Take 500 mg by mouth daily.  . metoprolol succinate (TOPROL-XL) 50 MG 24 hr tablet Take 75 mg by mouth daily. Take with or immediately following a meal.  . nitroGLYCERIN (NITROSTAT) 0.4 MG SL tablet Place 1 tablet (0.4 mg total) under the tongue every 5 (five) minutes x 3 doses as needed for chest pain (if no relief  after 3rd dose, proceed to the ED for an evaluation).  . simvastatin (ZOCOR) 20 MG tablet TAKE 1 TABLET BY MOUTH AT BEDTIME  . XARELTO 20 MG TABS tablet TAKE 1 Tablet BY MOUTH ONCE DAILY WITH SUPPER    Allergies:   Dexilant [dexlansoprazole]; Mushroom ext cmplx(shiitake-reishi-mait); Penicillins; and Doxycycline   Social History   Socioeconomic History  . Marital status: Married    Spouse name: Not on file  . Number of children: Not on file  . Years of education: Not on file  . Highest education level: Not on file  Occupational History  . Occupation: full time    Employer: UNEMPLOYED  Social Needs  . Financial resource strain: Not on file  . Food insecurity:    Worry: Not on file    Inability: Not on file  . Transportation needs:    Medical: Not on file    Non-medical: Not on file  Tobacco Use  . Smoking status: Current Every Day Smoker    Packs/day: 0.50    Years: 25.00    Pack years: 12.50    Types: Cigarettes    Start date: 07/17/1982  . Smokeless tobacco: Never Used  Substance and Sexual Activity  . Alcohol use: No    Alcohol/week: 0.0 oz  . Drug use: No  . Sexual activity: Yes    Birth control/protection: None  Lifestyle  . Physical activity:    Days per week: Not on file    Minutes per session: Not on file  . Stress: Not on file  Relationships  . Social connections:    Talks on phone: Not on file    Gets together: Not on file    Attends religious service: Not on file    Active member of club or organization: Not on file    Attends meetings of clubs or organizations: Not on file    Relationship status: Not on file  Other Topics Concern  . Not on file  Social History Narrative   Pt lives in [place] with wife.  5 children.  Unemployed due to panic attacks and back pain     Family History:  The patient's family history includes ADD / ADHD in his daughter; Alcohol abuse in his father and mother; Anxiety disorder in his sister and sister; Dementia in  his cousin and paternal uncle; Depression in his sister; Diabetes in his brother and father; Heart attack (age of onset: 90) in his brother; Heart attack (age of onset: 76) in his father; Hypertension in his brother, brother, and brother; Lung cancer in his mother; Seizures in his brother. There is no history of Colon cancer, Drug abuse, Bipolar disorder, OCD, Paranoid behavior, Schizophrenia, Sexual abuse, or Physical abuse.  ROS:   Please see the history of present illness.   All other systems are reviewed and otherwise negative.     PHYSICAL EXAM:   VS:  BP 126/74   Pulse 61   Ht 5\' 11"  (1.803 m)   Wt 205 lb (93 kg)   SpO2 97%   BMI 28.59 kg/m   BMI: Body mass index is 28.59 kg/m. GEN: Well nourished, well developed WM, in no acute distress. Odor of tobacco in room. HEENT: normocephalic, atraumatic Neck: no JVD, carotid bruits, or masses Cardiac: RRR; no murmurs, rubs, or gallops, no edema  Respiratory:  clear to auscultation bilaterally, normal work of breathing GI: soft, nontender, nondistended, + BS MS: no deformity or atrophy Skin: warm and dry, nonspecific early hyperpigmentation of lower extremities Neuro:  Alert and Oriented x 3, Strength and sensation are intact, follows commands Psych: euthymic mood, full affect  Wt Readings from Last 3 Encounters:  01/31/18 205 lb (93 kg)  01/24/18 205 lb (93 kg)  01/10/18 199 lb (90.3 kg)      Studies/Labs Reviewed:   EKG:  EKG was ordered today and personally reviewed by me and demonstrates sinus bradycardia 58bpm, nonspecific ST-T changes, QTc 342ms. No change from prior.  Recent Labs: 01/24/2018: ALT 28; BUN 12; Creatinine, Ser 0.88; Hemoglobin 15.9; Platelets 209; Potassium 4.2; Sodium 139   Lipid Panel    Component Value Date/Time   CHOL 126 01/24/2018 1039   TRIG 111 01/24/2018 1039   HDL 30 (L) 01/24/2018 1039   CHOLHDL 4.2 01/24/2018 1039   VLDL 22 01/24/2018 1039   [place] 74 01/24/2018 1039    Additional  studies/ records that were reviewed today include: Summarized above.  ASSESSMENT & PLAN:   1. Swelling- what he is describing sounds like mild dependent edema from the sockline at the end of the day. I do not appreciate any edema on exam whatsoever so doubt this requires diuretic, especially given his history of orthostatic intolerance in the heat. Will d/c amlodipine as it could be contributing and start losartan 25mg  daily (also has h/o DM so should be on ACEI or ARB for nephroprotection). Check BMET 1 week. The patient was instructed to monitor their blood pressure at home and to call if tending to run higher than 177 systolic or 80 diastolic on new regimen. Discussed leg elevation and decreasing sodium intake. 2. Paroxysmal atrial fibrillation - not as bothersome as before but still fairly frequent. Doesn't sound like this is as debilitating as his numerous other complains superimposed on baseline anxiety and depression. He would benefit from sleep study to exclude OSA given afib and daytime fatigue. He is amenable to proceeding. If he does have OSA he would likely benefit from treatment before proceeding with ablation. He has remained concerned about proceeding with ablation given prior post-cath complication. We will proceed with sleep study and arrange EP f/u in 3 months to decide on going forward. Continue present regimen otherwise except changes noted above. I probably wouldn't advance his beta blocker at present time given his h/o depression. He is in NSR today. 3. Mild CAD - no recent symptoms of angina. Risk reduction discussed including cessation of tobacco. 4. Essential HTN - follow with changes above.  Disposition: F/u with EP in 3 months, Dr. Bronson Ing in 6 months. I did not find any other significant pathology on exam today to correlate with his complaints of leg pain - suspect r/t neuropathy, this is chronic for quite a while. Pulses were brisk and excellent bilaterally so I asked him  to please review with primary care.   Medication Adjustments/Labs and Tests Ordered: Current medicines are reviewed at length with the patient today.  Concerns regarding medicines are outlined above. Medication changes, Labs and Tests ordered today are summarized above and listed in the Patient Instructions accessible in Encounters.   Signed, Charlie Pitter, PA-C  01/31/2018 1:11 PM    [facility] Location in [place]. [place], [place] [address] Ph: [phone]; Fax [phone]

## 2018-01-31 ENCOUNTER — Ambulatory Visit (INDEPENDENT_AMBULATORY_CARE_PROVIDER_SITE_OTHER): Payer: Self-pay | Admitting: Physician Assistant

## 2018-01-31 ENCOUNTER — Encounter: Payer: Self-pay | Admitting: Physician Assistant

## 2018-01-31 VITALS — BP 126/74 | HR 61 | Ht 71.0 in | Wt 205.0 lb

## 2018-01-31 DIAGNOSIS — I480 Paroxysmal atrial fibrillation: Secondary | ICD-10-CM

## 2018-01-31 DIAGNOSIS — R609 Edema, unspecified: Secondary | ICD-10-CM

## 2018-01-31 DIAGNOSIS — I2510 Atherosclerotic heart disease of native coronary artery without angina pectoris: Secondary | ICD-10-CM

## 2018-01-31 DIAGNOSIS — G4730 Sleep apnea, unspecified: Secondary | ICD-10-CM

## 2018-01-31 DIAGNOSIS — I10 Essential (primary) hypertension: Secondary | ICD-10-CM

## 2018-01-31 DIAGNOSIS — Z79899 Other long term (current) drug therapy: Secondary | ICD-10-CM

## 2018-01-31 MED ORDER — LOSARTAN POTASSIUM 25 MG PO TABS: 25.0000 mg | ORAL_TABLET | Freq: Every day | ORAL | 3 refills | Status: DC

## 2018-01-31 NOTE — Patient Instructions (Addendum)
Please monitor your blood pressure occasionally at home. Call your doctor if you tend to get readings of greater than 130 on the top number or 80 on the bottom number. Medication Instructions:  Your physician has recommended you make the following change in your medication:  Stop Taking Amilodipine  Start Taking Losartan 25 mg Daily    Labwork: Your physician recommends that you return for lab work in 1 Week    Testing/Procedures: Your physician has recommended that you have a sleep study. This test records several body functions during sleep, including: brain activity, eye movement, oxygen and carbon dioxide blood levels, heart rate and rhythm, breathing rate and rhythm, the flow of air through your mouth and nose, snoring, body muscle movements, and chest and belly movement.     Follow-Up: Your physician recommends that you schedule a follow-up appointment in: 3 Month with Dr. Rayann Heman Your physician wants you to follow-up in: 6 Month with Dr. Dwana Curd. You will receive a reminder letter in the mail two months in advance. If you don't receive a letter, please call our office to schedule the follow-up appointment.   Any Other Special Instructions Will Be Listed Below (If Applicable).     If you need a refill on your cardiac medications before your next appointment, please call your pharmacy.  Thank you for choosing [facility]!

## 2018-02-04 ENCOUNTER — Telehealth: Payer: Self-pay | Admitting: Gastroenterology

## 2018-02-04 NOTE — Telephone Encounter (Signed)
Pt needs more samples of Nexium if available. [phone]

## 2018-02-04 NOTE — Telephone Encounter (Signed)
Pt's wife, [name], is aware we do not have any samples.

## 2018-02-07 ENCOUNTER — Encounter (INDEPENDENT_AMBULATORY_CARE_PROVIDER_SITE_OTHER): Payer: Self-pay | Admitting: Orthopaedic Surgery

## 2018-02-07 ENCOUNTER — Ambulatory Visit (INDEPENDENT_AMBULATORY_CARE_PROVIDER_SITE_OTHER): Payer: Self-pay | Admitting: Orthopaedic Surgery

## 2018-02-07 VITALS — BP 119/72 | HR 54 | Ht 71.0 in | Wt 203.0 lb

## 2018-02-07 DIAGNOSIS — M7711 Lateral epicondylitis, right elbow: Secondary | ICD-10-CM

## 2018-02-07 NOTE — Progress Notes (Signed)
Office Visit Note   Patient: Richard [name]           Date of Birth: March 31, 1966           MRN: [id number] Visit Date: 02/07/2018              Requested by: [name], PA-C [address] [place], [place] [address] PCP: [name], PA-C   Assessment & Plan: Visit Diagnoses:  1. Lateral epicondylitis, right elbow     Plan: Right lateral epicondylar injection performed due to severe pain.  We discussed the importance of getting his diabetes taking care of potential for hyperglycemia after the injection.  His wife is a type I diabetic and will watch his sugars carefully.  He has an appointment next week for follow-up with his PCP.  He will return if he has ongoing problems.  Follow-Up Instructions: No follow-ups on file.   Orders:  No orders of the defined types were placed in this encounter.  No orders of the defined types were placed in this encounter.     Procedures: Medium Joint Inj: R lateral epicondyle on 02/08/2018 11:03 AM Indications: pain Details: 22 G 1.5 in needle, anterolateral approach Medications: 1 mL bupivacaine 0.5 %; 40 mg methylPREDNISolone acetate 40 MG/ML; 0.5 mL lidocaine 1 % Outcome: tolerated well, no immediate complications Procedure, treatment alternatives, risks and benefits explained, specific risks discussed. Consent was given by the patient. Immediately prior to procedure a time out was called to verify the correct patient, procedure, equipment, support staff and site/side marked as required. Patient was prepped and draped in the usual sterile fashion.       Clinical Data: No additional findings.   Subjective: Chief Complaint  Patient presents with  . Right Elbow - Pain    HPI 52 year old male returns and states she is had a considerable right elbow pain.  Pain with gripping even difficulty holding a cup.  He has problems when he pulls his pants up or uses a towel.  He had taken some hydrocodone and has had some ongoing problems  with his diabetes with A1c on 01/24/2018 of 9.1.  He is been on oral medication was on metformin 500 mg twice daily then had some problems with hypoglycemia and then was on metformin just 1 daily.  He has appointment coming up with his PCP shortly for review next week.  He has pain with squeezing and gripping.  Denies numbness or tingling in his fingers.  Review of Systems 14 point review of systems updated unchanged from 12/27/2017 other than as mentioned in HPI.   Objective: Vital Signs: BP 119/72   Pulse (!) 54   Ht 5\' 11"  (1.803 m)   Wt 203 lb (92.1 kg)   BMI 28.31 kg/m   Physical Exam  Constitutional: He is oriented to person, place, and time. He appears well-developed and well-nourished.  HENT:  Head: Normocephalic and atraumatic.  Eyes: Pupils are equal, round, and reactive to light. EOM are normal.  Neck: No tracheal deviation present. No thyromegaly present.  Cardiovascular: Normal rate.  Pulmonary/Chest: Effort normal. He has no wheezes.  Abdominal: Soft. Bowel sounds are normal.  Neurological: He is alert and oriented to person, place, and time.  Skin: Skin is warm and dry. Capillary refill takes less than 2 seconds.  Psychiatric: He has a normal mood and affect. His behavior is normal. Judgment and thought content normal.    Ortho Exam patient has exquisite tenderness over the lateral epicondyle.  Ulnar nerve in the cubital tunnel is normal.  No medial epicondylar tenderness.  He has pain when he reaches full extension pain with gripping and wrist with resisted wrist extension pointing directly to the lateral epicondyle.  No palpable defect is noted.  Median nerve of the forearm median nerve at the wrist and ulnar nerve in the hand is normal.  No interossei weakness.  No triggering of the digits.  Specialty Comments:  No specialty comments available.  Imaging: No results found.   PMFS History: Patient Active Problem List   Diagnosis Date Noted  . Other cervical disc  degeneration, unspecified cervical region 01/11/2018  . Tendinitis of right triceps 01/11/2018  . Neck pain 12/27/2017  . Lateral epicondylitis, right elbow 05/10/2017  . Constipation 12/24/2015  . Dyspnea 11/17/2015  . Coronary artery disease due to lipid rich plaque   . Heart palpitations 08/12/2015  . TIA (transient ischemic attack) 08/12/2015  . Colon cancer screening 08/02/2015  . Abdominal pain 12/18/2014  . Encounter for screening colonoscopy 12/18/2014  . Unspecified vitamin D deficiency 08/20/2012  . Arteriosclerotic cardiovascular disease (ASCVD) 04/11/2012  . Chronic low back pain   . Essential hypertension   . Anxiety and depression   . DIABETES MELLITUS-TYPE II 10/03/2010  . Cigarette smoker 09/14/2010  . CHRONIC OBSTRUCTIVE PULMONARY DISEASE 09/14/2010  . HLD (hyperlipidemia) 11/25/2009  . GASTROESOPHAGEAL REFLUX DISEASE 04/05/2009  . Hepatic steatosis 04/05/2009   Past Medical History:  Diagnosis Date  . Arthritis    "legs, spine, shoulders" (08/12/2015)  . Asthma   . Colitis 1990  . COPD (chronic obstructive pulmonary disease) ([place])   . Depression   . Gastric ulcer 2003; 2012   2003: + esophagitis; negative H.pylori serology  2012: Dr. Oneida Alar, mild gastritis, Bravo PH probe placement, negative H.pylori  . GERD (gastroesophageal reflux disease)   . Hepatic steatosis   . History of hiatal hernia   . Hyperlipemia   . Hypertension   . Mild CAD    a. Cardiac cath 07/2015 showed 65% distal Cx, 20% mid-distal LAD, 20% prox-distal RCA, EF 60%, EDP 25mmHg.  [name] Overweight   . Panic attacks   . Paroxysmal atrial fibrillation (HCC)   . Stroke [facility])    mucsle in left side of face dosent work as its supposed to-only deficit.  [name] TIA (transient ischemic attack)    after cath 1/ 17  . Tobacco abuse    1/2 pack per day  . Type II diabetes mellitus (HCC)     Family History  Problem Relation Age of Onset  . Lung cancer Mother   . Alcohol abuse Mother   . Heart attack  Father 51  . Diabetes Father   . Alcohol abuse Father   . Hypertension Brother   . Hypertension Brother   . Anxiety disorder Sister   . Depression Sister   . Anxiety disorder Sister   . Heart attack Brother 58  . Diabetes Brother   . Hypertension Brother   . Seizures Brother   . Dementia Paternal Uncle   . Dementia Cousin   . ADD / ADHD Daughter   . Colon cancer Neg Hx   . Drug abuse Neg Hx   . Bipolar disorder Neg Hx   . OCD Neg Hx   . Paranoid behavior Neg Hx   . Schizophrenia Neg Hx   . Sexual abuse Neg Hx   . Physical abuse Neg Hx     Past Surgical History:  Procedure Laterality Date  .  BRAVO [place] STUDY  05/03/2011   KZS:WFUX gastritis/normal esophagus and duodenum  . CARDIAC CATHETERIZATION  1990s X 1; 2005; 08/12/2015  . CARDIAC CATHETERIZATION N/A 08/12/2015   Procedure: Left Heart Cath and Coronary Angiography;  Surgeon: Belva Crome, MD; LAD 20%, CFX 65%, RCA 20%, EF 60%   . COLONOSCOPY  1990  . COLONOSCOPY WITH PROPOFOL N/A 11/21/2016   Procedure: COLONOSCOPY WITH PROPOFOL;  Surgeon: [name], MD;  Location: [facility];  Service: Endoscopy;  Laterality: N/A;  7:30AM  . ESOPHAGOGASTRODUODENOSCOPY  05/03/2011   NAT:FTDD gastritis  . NECK MASS EXCISION Right    "done in dr's office; behind right ear/side of ncek"  . POLYPECTOMY  11/21/2016   Procedure: POLYPECTOMY;  Surgeon: [name], MD;  Location: [facility] END[facility] SUITE;  Service: Endoscopy;;  descending colon polyp  . SHOULDER ARTHROSCOPY W/ ROTATOR CUFF REPAIR Right 2006   acromioclavicular joint arthrosis   Social History   Occupational History  . Occupation: full time    Employer: UNEMPLOYED  Tobacco Use  . Smoking status: Current Every Day Smoker    Packs/day: 0.50    Years: 25.00    Pack years: 12.50    Types: Cigarettes    Start date: 07/17/1982  . Smokeless tobacco: Never Used  Substance and Sexual Activity  . Alcohol use: No    Alcohol/week: 0.0 oz  . Drug use: No  . Sexual activity: Yes     Birth control/protection: None

## 2018-02-08 ENCOUNTER — Encounter (INDEPENDENT_AMBULATORY_CARE_PROVIDER_SITE_OTHER): Payer: Self-pay | Admitting: Orthopaedic Surgery

## 2018-02-08 DIAGNOSIS — M7711 Lateral epicondylitis, right elbow: Secondary | ICD-10-CM

## 2018-02-08 MED ORDER — BUPIVACAINE HCL 0.5 % IJ SOLN
1.0000 mL | INTRAMUSCULAR | Status: AC | PRN
  Administered 2018-02-08: 1 mL via INTRA_ARTICULAR

## 2018-02-08 MED ORDER — LIDOCAINE HCL 1 % IJ SOLN
0.5000 mL | INTRAMUSCULAR | Status: AC | PRN
  Administered 2018-02-08: .5 mL

## 2018-02-08 MED ORDER — METHYLPREDNISOLONE ACETATE 40 MG/ML IJ SUSP
40.0000 mg | INTRAMUSCULAR | Status: AC | PRN
  Administered 2018-02-08: 40 mg via INTRA_ARTICULAR

## 2018-02-13 ENCOUNTER — Other Ambulatory Visit (HOSPITAL_COMMUNITY)
Admission: RE | Admit: 2018-02-13 | Discharge: 2018-02-13 | Disposition: A | Discharge status: Home / Self Care | Payer: Self-pay | Source: Ambulatory Visit | Attending: Physician Assistant | Admitting: Physician Assistant

## 2018-02-13 ENCOUNTER — Ambulatory Visit: Payer: Self-pay | Admitting: Physician Assistant

## 2018-02-13 ENCOUNTER — Encounter: Payer: Self-pay | Admitting: Physician Assistant

## 2018-02-13 VITALS — BP 120/78 | HR 58 | Temp 97.3°F | Ht 71.0 in | Wt 203.0 lb

## 2018-02-13 DIAGNOSIS — I10 Essential (primary) hypertension: Secondary | ICD-10-CM

## 2018-02-13 DIAGNOSIS — IMO0002 Reserved for concepts with insufficient information to code with codable children: Secondary | ICD-10-CM

## 2018-02-13 DIAGNOSIS — Z79899 Other long term (current) drug therapy: Secondary | ICD-10-CM | POA: Insufficient documentation

## 2018-02-13 DIAGNOSIS — G8929 Other chronic pain: Secondary | ICD-10-CM

## 2018-02-13 DIAGNOSIS — I2510 Atherosclerotic heart disease of native coronary artery without angina pectoris: Secondary | ICD-10-CM

## 2018-02-13 DIAGNOSIS — J449 Chronic obstructive pulmonary disease, unspecified: Secondary | ICD-10-CM

## 2018-02-13 DIAGNOSIS — M545 Low back pain, unspecified: Secondary | ICD-10-CM

## 2018-02-13 DIAGNOSIS — E785 Hyperlipidemia, unspecified: Secondary | ICD-10-CM

## 2018-02-13 DIAGNOSIS — Z125 Encounter for screening for malignant neoplasm of prostate: Secondary | ICD-10-CM

## 2018-02-13 DIAGNOSIS — F17219 Nicotine dependence, cigarettes, with unspecified nicotine-induced disorders: Secondary | ICD-10-CM

## 2018-02-13 DIAGNOSIS — E118 Type 2 diabetes mellitus with unspecified complications: Principal | ICD-10-CM

## 2018-02-13 DIAGNOSIS — R609 Edema, unspecified: Secondary | ICD-10-CM | POA: Insufficient documentation

## 2018-02-13 DIAGNOSIS — I480 Paroxysmal atrial fibrillation: Secondary | ICD-10-CM

## 2018-02-13 DIAGNOSIS — E1165 Type 2 diabetes mellitus with hyperglycemia: Secondary | ICD-10-CM

## 2018-02-13 LAB — BASIC METABOLIC PANEL
Anion gap: 8 (ref 5–15)
BUN: 20 mg/dL (ref 6–20)
CALCIUM: 9.4 mg/dL (ref 8.9–10.3)
CO2: 28 mmol/L (ref 22–32)
CREATININE: 0.99 mg/dL (ref 0.61–1.24)
Chloride: 103 mmol/L (ref 98–111)
GFR calc non Af Amer: >60 mL/min (ref >60)
GLUCOSE: 181 mg/dL — AB (ref 70–99)
Potassium: 4.1 mmol/L (ref 3.5–5.1)
Sodium: 139 mmol/L (ref 135–145)

## 2018-02-13 MED ORDER — SITAGLIPTIN PHOSPHATE 100 MG PO TABS: 100.0000 mg | ORAL_TABLET | Freq: Every day | ORAL | 1 refills | Status: DC

## 2018-02-13 NOTE — Progress Notes (Signed)
BP 120/78 (BP Location: Right Arm, Patient Position: Sitting, Cuff Size: Normal)   Pulse (!) 58   Temp (!) 97.3 F (36.3 C)   Ht 5\' 11"  (1.803 m)   Wt 203 lb (92.1 kg)   SpO2 96%   BMI 28.31 kg/m    Subjective:    Patient ID: Richard [name], male    DOB: 07-16-1966, 52 y.o.   MRN: [id number]  HPI: Richard [name] is a 52 y.o. male presenting on 02/13/2018 for Diabetes; Hyperlipidemia; and Hypertension   HPI   Pt says he feels drained when he takes his metformin twice daily.  Pt was drinking about 6 cans of Mt Dew each day.   He says he recently quit and has decided to try to start taking care of himself and following recommendations.  He is still smoking but says he hopes to tackle that soon.  Relevant past medical, surgical, family and social history reviewed and updated as indicated. Interim medical history since our last visit reviewed. Allergies and medications reviewed and updated.   Current Outpatient Medications:  .  acetaminophen (TYLENOL) 325 MG tablet, Take 650 mg by mouth every 6 (six) hours as needed for pain or fever. , Disp: , Rfl:  .  albuterol (VENTOLIN HFA) 108 (90 BASE) MCG/ACT inhaler, Inhale 2 puffs into the lungs every 6 (six) hours as needed for wheezing or shortness of breath. , Disp: , Rfl:  .  ALPRAZolam (XANAX) 0.5 MG tablet, Take one tablet by mouth three times a day and two tablets at night, Disp: 150 tablet, Rfl: 2 .  budesonide-formoterol (SYMBICORT) 80-4.5 MCG/ACT inhaler, Inhale 2 puffs into the lungs 2 (two) times daily as needed. , Disp: , Rfl:  .  calcium carbonate (TUMS - DOSED IN MG ELEMENTAL CALCIUM) 500 MG chewable tablet, Chew 1 tablet by mouth daily as needed for indigestion or heartburn., Disp: , Rfl:  .  cyclobenzaprine (FLEXERIL) 10 MG tablet, Take 1 tablet (10 mg total) by mouth 3 (three) times daily. (Patient taking differently: Take 10 mg by mouth 3 (three) times daily as needed for muscle spasms. ), Disp: 21 tablet, Rfl: 0 .   esomeprazole (NEXIUM) 20 MG capsule, Take 40 mg by mouth at bedtime., Disp: , Rfl:  .  HYDROcodone-acetaminophen (NORCO/VICODIN) 5-325 MG tablet, Take one to two tablets every 4-6 hours as needed for pain, Disp: 40 tablet, Rfl: 0 .  losartan (COZAAR) 25 MG tablet, Take 1 tablet (25 mg total) by mouth daily., Disp: 90 tablet, Rfl: 3 .  metFORMIN (GLUCOPHAGE) 500 MG tablet, Take 500 mg by mouth daily., Disp: , Rfl:  .  metoprolol succinate (TOPROL-XL) 50 MG 24 hr tablet, Take 75 mg by mouth daily. Take with or immediately following a meal., Disp: 135 tablet, Rfl: 3 .  nitroGLYCERIN (NITROSTAT) 0.4 MG SL tablet, Place 1 tablet (0.4 mg total) under the tongue every 5 (five) minutes x 3 doses as needed for chest pain (if no relief after 3rd dose, proceed to the ED for an evaluation)., Disp: 25 tablet, Rfl: 3 .  simvastatin (ZOCOR) 20 MG tablet, TAKE 1 TABLET BY MOUTH AT BEDTIME, Disp: 30 tablet, Rfl: 4 .  XARELTO 20 MG TABS tablet, TAKE 1 Tablet BY MOUTH ONCE DAILY WITH SUPPER, Disp: 90 tablet, Rfl: 3   Review of Systems  Constitutional: Positive for appetite change, diaphoresis and fatigue. Negative for chills, fever and unexpected weight change.  HENT: Positive for congestion, dental problem and sneezing. Negative for  drooling, ear pain, facial swelling, hearing loss, mouth sores, sore throat, trouble swallowing and voice change.   Eyes: Negative for pain, discharge, redness, itching and visual disturbance.  Respiratory: Negative for cough, choking, shortness of breath and wheezing.   Cardiovascular: Positive for palpitations and leg swelling. Negative for chest pain.  Gastrointestinal: Positive for abdominal pain and constipation. Negative for blood in stool, diarrhea and vomiting.  Endocrine: Positive for cold intolerance and heat intolerance. Negative for polydipsia.  Genitourinary: Negative for decreased urine volume, dysuria and hematuria.  Musculoskeletal: Positive for arthralgias, back pain  and gait problem.  Skin: Negative for rash.  Allergic/Immunologic: Positive for environmental allergies.  Neurological: Positive for light-headedness and headaches. Negative for seizures and syncope.  Hematological: Negative for adenopathy.  Psychiatric/Behavioral: Positive for agitation and dysphoric mood. Negative for suicidal ideas. The patient is nervous/anxious.     Per HPI unless specifically indicated above     Objective:    BP 120/78 (BP Location: Right Arm, Patient Position: Sitting, Cuff Size: Normal)   Pulse (!) 58   Temp (!) 97.3 F (36.3 C)   Ht 5\' 11"  (1.803 m)   Wt 203 lb (92.1 kg)   SpO2 96%   BMI 28.31 kg/m   Wt Readings from Last 3 Encounters:  02/13/18 203 lb (92.1 kg)  02/07/18 203 lb (92.1 kg)  01/31/18 205 lb (93 kg)    Physical Exam  Constitutional: He is oriented to person, place, and time. He appears well-developed and well-nourished.  HENT:  Head: Normocephalic and atraumatic.  Neck: Neck supple.  Cardiovascular: Normal rate and regular rhythm.  Pulmonary/Chest: Effort normal and breath sounds normal. He has no wheezes.  Abdominal: Soft. Bowel sounds are normal. There is no hepatosplenomegaly. There is no tenderness.  Musculoskeletal: He exhibits no edema.  Lymphadenopathy:    He has no cervical adenopathy.  Neurological: He is alert and oriented to person, place, and time.  Skin: Skin is warm and dry.  Psychiatric: He has a normal mood and affect. His behavior is normal.  Vitals reviewed.   Results for orders placed or performed during the hospital encounter of 01/24/18  CBC  Result Value Ref Range   WBC 5.9 4.0 - 10.5 K/uL   RBC 4.97 4.22 - 5.81 MIL/uL   Hemoglobin 15.9 13.0 - 17.0 g/dL   HCT 45.7 39.0 - 52.0 %   MCV 92.0 78.0 - 100.0 fL   MCH 32.0 26.0 - 34.0 pg   MCHC 34.8 30.0 - 36.0 g/dL   RDW 12.3 11.5 - 15.5 %   Platelets 209 150 - 400 K/uL  INR/PT  Result Value Ref Range   Prothrombin Time 21.0 (H) 11.4 - 15.2 seconds   INR  1.83   Hemoglobin A1c  Result Value Ref Range   Hgb A1c MFr Bld 9.1 (H) 4.8 - 5.6 %   Mean Plasma Glucose 214.47 mg/dL  Lipid panel  Result Value Ref Range   Cholesterol 126 0 - 200 mg/dL   Triglycerides 111 <150 mg/dL   HDL 30 (L) >40 mg/dL   Total CHOL/HDL Ratio 4.2 RATIO   VLDL 22 0 - 40 mg/dL   LDL Cholesterol 74 0 - 99 mg/dL  Comprehensive metabolic panel  Result Value Ref Range   Sodium 139 135 - 145 mmol/L   Potassium 4.2 3.5 - 5.1 mmol/L   Chloride 104 98 - 111 mmol/L   CO2 28 22 - 32 mmol/L   Glucose, Bld 213 (H) 70 - 99 mg/dL  BUN 12 6 - 20 mg/dL   Creatinine, Ser 0.88 0.61 - 1.24 mg/dL   Calcium 8.8 (L) 8.9 - 10.3 mg/dL   Total Protein 6.9 6.5 - 8.1 g/dL   Albumin 3.7 3.5 - 5.0 g/dL   AST 18 15 - 41 U/L   ALT 28 0 - 44 U/L   Alkaline Phosphatase 52 38 - 126 U/L   Total Bilirubin 0.4 0.3 - 1.2 mg/dL   GFR calc non Af Amer >60 >60 mL/min   GFR calc Af Amer >60 >60 mL/min   Anion gap 7 5 - 15      Assessment & Plan:   Encounter Diagnoses  Name Primary?  [name] Uncontrolled diabetes mellitus with complications ([place]) Yes  . Essential hypertension   . Hyperlipidemia, unspecified hyperlipidemia type   . Cigarette nicotine dependence with nicotine-induced disorder   . Paroxysmal atrial fibrillation (HCC)   . Arteriosclerotic cardiovascular disease (ASCVD)   . Chronic obstructive pulmonary disease, unspecified COPD type ([place])   . Chronic low back pain without sciatica, unspecified back pain laterality     -reviewed labs with pt -will Start januvia and discontinue metformin -pt counseled to Avoid Mt Dew! -bp good -Cholesterol good -pt to continue all current meds except the staring of januvia and stopping metformin -pt is to monitor fbs when he starts his [place].  If he is getting am fbs > 150 after he has been on it for several weeks, he is to RTO for medication adjustment.   -otherwise follow up in 3 months

## 2018-02-14 ENCOUNTER — Other Ambulatory Visit: Payer: Self-pay | Admitting: Physician Assistant

## 2018-02-14 MED ORDER — ALBUTEROL SULFATE HFA 108 (90 BASE) MCG/ACT IN AERS: 2.0000 | INHALATION_SPRAY | Freq: Four times a day (QID) | RESPIRATORY_TRACT | 0 refills | Status: DC | PRN

## 2018-02-14 NOTE — Progress Notes (Signed)
Pt has been made aware of normal result and verbalized understanding.  jw 02/14/18

## 2018-02-18 ENCOUNTER — Telehealth: Payer: Self-pay

## 2018-02-18 NOTE — Telephone Encounter (Signed)
Pt's wife called office requesting Nexium samples. #20 placed at front desk.

## 2018-02-21 ENCOUNTER — Encounter

## 2018-02-21 ENCOUNTER — Encounter: Payer: Self-pay | Admitting: *Deleted

## 2018-02-21 ENCOUNTER — Ambulatory Visit (INDEPENDENT_AMBULATORY_CARE_PROVIDER_SITE_OTHER): Payer: Self-pay | Admitting: Gastroenterology

## 2018-02-21 ENCOUNTER — Other Ambulatory Visit: Payer: Self-pay | Admitting: *Deleted

## 2018-02-21 ENCOUNTER — Encounter: Payer: Self-pay | Admitting: Gastroenterology

## 2018-02-21 VITALS — BP 130/81 | HR 60 | Temp 97.0°F | Ht 71.0 in | Wt 203.8 lb

## 2018-02-21 DIAGNOSIS — K5900 Constipation, unspecified: Secondary | ICD-10-CM

## 2018-02-21 DIAGNOSIS — K649 Unspecified hemorrhoids: Secondary | ICD-10-CM

## 2018-02-21 DIAGNOSIS — F458 Other somatoform disorders: Secondary | ICD-10-CM

## 2018-02-21 DIAGNOSIS — R0989 Other specified symptoms and signs involving the circulatory and respiratory systems: Secondary | ICD-10-CM | POA: Insufficient documentation

## 2018-02-21 NOTE — Assessment & Plan Note (Signed)
52 year old male with multiple upper GI symptoms including globus sensation, pill dysphagia but no solid food dysphagia, reports of feeling like afib is worsening with spicy foods. Remains on Nexium daily. Last EGD in 2012 with gastritis. Will start with BPE. May need EGD if evidence of stricture or other abnormalities. Anticipate possible manometry in the near future after review of BPE. Continue follow-up with cardiology.   As of note, reports tick bite several months ago. No systemic symptoms. Lesion on back possibly erythema migrans; he tells me he has talked to his PCP about this. Allergies noted to any oral meds to give for erythema migrans, and serologies not indicated at this point. Follow-up with PCP.

## 2018-02-21 NOTE — Patient Instructions (Addendum)
I have ordered an xray of your esophagus. You may need an upper endoscopy versus other procedures to assess your upper GI tract.  For constipation: start taking Amitiza 1 gelcap with food twice a day(to avoid nausea, take with food). Let me know how this works. We will try to get some type of assistance for you. We don't have the higher dosage, so we will start with the lower dosage twice a day. If this is not helping, we can try the higher dosage when we get more samples.  I reviewed most recent updates regarding tick bites and treatment: you are allergic to the oral regimens that would be given for the tick bite on your back. It is also not recommended to test serologically for Lyme disease without other symptoms. I suggest following up with [facility] if any persistent issues.   Further recommendations after your xray!  It was a pleasure to see you today. I strive to create trusting relationships with patients to provide genuine, compassionate, and quality care. I value your feedback. If you receive a survey regarding your visit,  I greatly appreciate you taking time to fill this out.   Annitta Needs, PhD, ANP-BC [facility]

## 2018-02-21 NOTE — Progress Notes (Signed)
CC'D TO PCP °

## 2018-02-21 NOTE — Assessment & Plan Note (Signed)
Start Amitiza 8 mcg BID. May need 24 mcg.

## 2018-02-21 NOTE — Progress Notes (Signed)
Referring Provider: [name], PA-C Primary Care Physician:  [name], PA-C Primary GI: Dr. Oneida Alar   Chief Complaint  Patient presents with  . Abdominal Pain    pain on left/right side, burning in upper abd  . Gastroesophageal Reflux    certain foods will cause his AFIB "to act up"    HPI:   Richard [name] is a 52 y.o. male presenting today with a history of chronic GERD, abdominal pain, and constipation. Last EGD in 2012 with gastritis. Colonoscopy on file from last year, next in 10 years.   US abdomen in 2017 with fatty liver. HIDA normal with EF 83%. Last CT 6 years ago benign.   Presents with multiple issues today. States spicy foods cause his "afib to act up". Reports he then is able to feel his heart palpitating. Cheese slices do this as well but not block cheese. Taking Nexium in the morning. Will take Tums when he feels like his heart is abnormal and this relieves it. Feels a knot above his sternum when taking pills. No solid food dysphagia. Mouth tastes like chalk after swallowing pills. Notes globus sensation. Occasional epigastric discomfort, right-sided and left-sided vague discomfort at times. BMs "not great". Constipated. Hasn't been evaluated for hemorrhoids by Dr. Arnoldo Morale as recommended last year. Wants referral again. 2 months ago bitten by a tick left lower back. Wants to be checked for Lyme disease.   Past Medical History:  Diagnosis Date  . Arthritis    "legs, spine, shoulders" (08/12/2015)  . Asthma   . Colitis 1990  . COPD (chronic obstructive pulmonary disease) ([place])   . Depression   . Gastric ulcer 2003; 2012   2003: + esophagitis; negative H.pylori serology  2012: Dr. Oneida Alar, mild gastritis, Bravo PH probe placement, negative H.pylori  . GERD (gastroesophageal reflux disease)   . Hepatic steatosis   . History of hiatal hernia   . Hyperlipemia   . Hypertension   . Mild CAD    a. Cardiac cath 07/2015 showed 65% distal Cx, 20% mid-distal  LAD, 20% prox-distal RCA, EF 60%, EDP 39mmHg.  [name] Overweight   . Panic attacks   . Paroxysmal atrial fibrillation (HCC)   . Stroke [facility])    mucsle in left side of face dosent work as its supposed to-only deficit.  [name] TIA (transient ischemic attack)    after cath 1/ 17  . Tobacco abuse    1/2 pack per day  . Type II diabetes mellitus ([place])     Past Surgical History:  Procedure Laterality Date  . BRAVO [place] STUDY  05/03/2011   WUJ:WJXB gastritis/normal esophagus and duodenum  . CARDIAC CATHETERIZATION  1990s X 1; 2005; 08/12/2015  . CARDIAC CATHETERIZATION N/A 08/12/2015   Procedure: Left Heart Cath and Coronary Angiography;  Surgeon: Belva Crome, MD; LAD 20%, CFX 65%, RCA 20%, EF 60%   . COLONOSCOPY  1990  . COLONOSCOPY WITH PROPOFOL N/A 11/21/2016   Dr. Oneida Alar: non-thrombosed external hemorrhoids, one 6 mm polyp (polypoid lesion), internal hemorrhoids. TI Normal. 10 years screening  . ESOPHAGOGASTRODUODENOSCOPY  05/03/2011   JYN:WGNF gastritis  . NECK MASS EXCISION Right    "done in dr's office; behind right ear/side of ncek"  . POLYPECTOMY  11/21/2016   Procedure: POLYPECTOMY;  Surgeon: [name], MD;  Location: [facility] SU[facility];  Service: Endoscopy;;  descending colon polyp  . SHOULDER ARTHROSCOPY W/ ROTATOR CUFF REPAIR Right 2006   acromioclavicular joint arthrosis  Current Outpatient Medications  Medication Sig Dispense Refill  . acetaminophen (TYLENOL) 325 MG tablet Take 650 mg by mouth every 6 (six) hours as needed for pain or fever.     [name] albuterol (PROVENTIL HFA;VENTOLIN HFA) 108 (90 Base) MCG/ACT inhaler Inhale 2 puffs into the lungs every 6 (six) hours as needed for wheezing or shortness of breath. 3 Inhaler 0  . ALPRAZolam (XANAX) 0.5 MG tablet Take one tablet by mouth three times a day and two tablets at night 150 tablet 2  . budesonide-formoterol (SYMBICORT) 80-4.5 MCG/ACT inhaler Inhale 2 puffs into the lungs 2 (two) times daily as needed.     . calcium carbonate  (TUMS - DOSED IN MG ELEMENTAL CALCIUM) 500 MG chewable tablet Chew 1 tablet by mouth daily as needed for indigestion or heartburn.    . cyclobenzaprine (FLEXERIL) 10 MG tablet Take 1 tablet (10 mg total) by mouth 3 (three) times daily. (Patient taking differently: Take 10 mg by mouth 3 (three) times daily as needed for muscle spasms. ) 21 tablet 0  . esomeprazole (NEXIUM) 20 MG capsule Take 40 mg by mouth every morning.     [name] HYDROcodone-acetaminophen (NORCO/VICODIN) 5-325 MG tablet Take one to two tablets every 4-6 hours as needed for pain 40 tablet 0  . losartan (COZAAR) 25 MG tablet Take 1 tablet (25 mg total) by mouth daily. 90 tablet 3  . metFORMIN (GLUCOPHAGE) 500 MG tablet Take 500 mg by mouth daily.    . metoprolol succinate (TOPROL-XL) 50 MG 24 hr tablet Take 75 mg by mouth daily. Take with or immediately following a meal. 135 tablet 3  . nitroGLYCERIN (NITROSTAT) 0.4 MG SL tablet Place 1 tablet (0.4 mg total) under the tongue every 5 (five) minutes x 3 doses as needed for chest pain (if no relief after 3rd dose, proceed to the ED for an evaluation). 25 tablet 3  . simvastatin (ZOCOR) 20 MG tablet TAKE 1 TABLET BY MOUTH AT BEDTIME 30 tablet 4  . sitaGLIPtin (JANUVIA) 100 MG tablet Take 1 tablet (100 mg total) by mouth daily. 90 tablet 1  . XARELTO 20 MG TABS tablet TAKE 1 Tablet BY MOUTH ONCE DAILY WITH SUPPER 90 tablet 3   No current facility-administered medications for this visit.     Allergies as of 02/21/2018 - Review Complete 02/21/2018  Allergen Reaction Noted  . Dexilant [dexlansoprazole] Anaphylaxis 01/20/2015  . Mushroom ext cmplx(shiitake-reishi-mait) Anaphylaxis 03/22/2011  . Penicillins Anaphylaxis   . Doxycycline  04/18/2016    Family History  Problem Relation Age of Onset  . Lung cancer Mother   . Alcohol abuse Mother   . Heart attack Father 50  . Diabetes Father   . Alcohol abuse Father   . Hypertension Brother   . Hypertension Brother   . Anxiety disorder  Sister   . Depression Sister   . Anxiety disorder Sister   . Heart attack Brother 36  . Diabetes Brother   . Hypertension Brother   . Seizures Brother   . Dementia Paternal Uncle   . Dementia Cousin   . ADD / ADHD Daughter   . Colon cancer Neg Hx   . Drug abuse Neg Hx   . Bipolar disorder Neg Hx   . OCD Neg Hx   . Paranoid behavior Neg Hx   . Schizophrenia Neg Hx   . Sexual abuse Neg Hx   . Physical abuse Neg Hx     Social History   Socioeconomic History  .  Marital status: Married    Spouse name: Not on file  . Number of children: Not on file  . Years of education: Not on file  . Highest education level: Not on file  Occupational History  . Occupation: full time    Employer: UNEMPLOYED  Social Needs  . Financial resource strain: Not on file  . Food insecurity:    Worry: Not on file    Inability: Not on file  . Transportation needs:    Medical: Not on file    Non-medical: Not on file  Tobacco Use  . Smoking status: Current Every Day Smoker    Packs/day: 0.25    Years: 25.00    Pack years: 6.25    Types: Cigarettes    Start date: 07/17/1982  . Smokeless tobacco: Never Used  Substance and Sexual Activity  . Alcohol use: No    Alcohol/week: 0.0 standard drinks  . Drug use: No  . Sexual activity: Yes    Birth control/protection: None  Lifestyle  . Physical activity:    Days per week: Not on file    Minutes per session: Not on file  . Stress: Not on file  Relationships  . Social connections:    Talks on phone: Not on file    Gets together: Not on file    Attends religious service: Not on file    Active member of club or organization: Not on file    Attends meetings of clubs or organizations: Not on file    Relationship status: Not on file  Other Topics Concern  . Not on file  Social History Narrative   Pt lives in [place] with wife.  5 children.  Unemployed due to panic attacks and back pain    Review of Systems: As mentioned in HPI   Physical  Exam: BP 130/81   Pulse 60   Temp (!) 97 F (36.1 C) (Oral)   Ht 5\' 11"  (1.803 m)   Wt 203 lb 12.8 oz (92.4 kg)   BMI 28.42 kg/m  General:   Alert and oriented. No distress noted. Pleasant and cooperative.  Head:  Normocephalic and atraumatic. Eyes:  Conjuctiva clear without scleral icterus. Mouth:  Oral mucosa pink and moist.  Abdomen:  +BS, soft, non-tender and non-distended. No rebound or guarding. No HSM or masses noted. Msk:  Symmetrical without gross deformities. Normal posture. Extremities:  Without edema. Neurologic:  Alert and  oriented x4 Skin: left lower back with small lesion at possible site of tick bite, scabbed in center, mild erythema surrounding, ?erythema migrans? No other lesions Psych:  Alert and cooperative. Normal mood and affect.

## 2018-02-25 ENCOUNTER — Ambulatory Visit (HOSPITAL_COMMUNITY)
Admission: RE | Admit: 2018-02-25 | Discharge: 2018-02-25 | Disposition: A | Discharge status: Home / Self Care | Payer: Self-pay | Source: Ambulatory Visit | Attending: Gastroenterology | Admitting: Gastroenterology

## 2018-02-25 ENCOUNTER — Telehealth: Payer: Self-pay | Admitting: Gastroenterology

## 2018-02-25 DIAGNOSIS — R0989 Other specified symptoms and signs involving the circulatory and respiratory systems: Secondary | ICD-10-CM

## 2018-02-25 DIAGNOSIS — K224 Dyskinesia of esophagus: Secondary | ICD-10-CM | POA: Insufficient documentation

## 2018-02-25 DIAGNOSIS — F458 Other somatoform disorders: Secondary | ICD-10-CM | POA: Insufficient documentation

## 2018-02-25 NOTE — Telephone Encounter (Signed)
Pt's wife called to see if patient could get more Nexium samples. Please call [phone]

## 2018-02-25 NOTE — Telephone Encounter (Signed)
Nexium OTC samples left at front ( 7 boxes-14 tablets total).

## 2018-02-28 NOTE — Progress Notes (Signed)
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 Recommend manometry in [place] if willing. EGD not likely to help.

## 2018-03-01 ENCOUNTER — Telehealth: Payer: Self-pay | Admitting: Cardiovascular Disease

## 2018-03-01 MED ORDER — LOSARTAN POTASSIUM 25 MG PO TABS: ORAL_TABLET | ORAL | 3 refills | Status: DC

## 2018-03-01 NOTE — Progress Notes (Signed)
Pt's wife, Reubin Milan is aware. She will discuss with pt, she is not with him at the moment. She will return call and let us know what to do.

## 2018-03-01 NOTE — Telephone Encounter (Addendum)
I would suggest he f/u with PCP to discuss further. Pt mentioned several times during OV about not being able to work but at most recent [place] did not have a specific active cardiac issue to keep him from working. Had numerous somatic complaints. He reported edema that was not present on exam. I worry his anxiety is what needs the most attention at present time -> suggest he see PCP this week.   He can try holding the losartan altogether in the meantime to see if it makes a difference.  Md Smola PA-C

## 2018-03-01 NOTE — Telephone Encounter (Signed)
Wife will gold losartan, and will make apt with mental health provider he sees

## 2018-03-01 NOTE — Telephone Encounter (Signed)
Per wife, pt has trouble with heat still.Stopped amlodipine and placed on losartan, made no difference. Patient gets anxiety attack when he gets hot and cannot work outside per wife.

## 2018-03-01 NOTE — Telephone Encounter (Signed)
Pt is still having problems w/ his BP since meds changes from last office visit. Please call [phone]

## 2018-03-04 ENCOUNTER — Telehealth: Payer: Self-pay

## 2018-03-04 DIAGNOSIS — R1310 Dysphagia, unspecified: Secondary | ICD-10-CM

## 2018-03-04 NOTE — Addendum Note (Signed)
Addended by: Hassan Rowan on: 03/04/2018 03:16 PM   Modules accepted: Orders

## 2018-03-04 NOTE — Telephone Encounter (Signed)
Referral sent to Dr. Silverio Decamp for manometry. Their office will contact pt to schedule.

## 2018-03-04 NOTE — Telephone Encounter (Signed)
Pt's wife called and said he is ready to schedule the manometry in [place].  ( See result note from the X ray of esophagus).  He is requesting August 30 th, or first part of September.  Forwarding to [facility] to schedule.

## 2018-03-05 ENCOUNTER — Other Ambulatory Visit: Payer: Self-pay

## 2018-03-05 ENCOUNTER — Telehealth: Payer: Self-pay | Admitting: Cardiovascular Disease

## 2018-03-05 ENCOUNTER — Telehealth: Payer: Self-pay

## 2018-03-05 NOTE — Telephone Encounter (Signed)
Received referral message, manometry scheduled 03/20/18. Pt already aware.

## 2018-03-05 NOTE — Telephone Encounter (Signed)
Pt's wife requested samples of Nexium OTC. #10 tablets at front for pick up.

## 2018-03-05 NOTE — Telephone Encounter (Signed)
See 8/16 phone note: Patient was told to hold losartan because he felt bad .Wife called to say he went back on losartan Sunday because his BP was 145/80 and does not have any BP readings after going back on medication.She is going to call back tomorrow with BP reading from later today and tomorrow am

## 2018-03-05 NOTE — Telephone Encounter (Signed)
Patient's wife would like to speak with nurse regarding BP medication questions. / tg

## 2018-03-06 ENCOUNTER — Ambulatory Visit (INDEPENDENT_AMBULATORY_CARE_PROVIDER_SITE_OTHER): Payer: Self-pay | Admitting: Psychiatry

## 2018-03-06 ENCOUNTER — Encounter (HOSPITAL_COMMUNITY): Payer: Self-pay | Admitting: Psychiatry

## 2018-03-06 ENCOUNTER — Other Ambulatory Visit: Payer: Self-pay | Admitting: Cardiovascular Disease

## 2018-03-06 VITALS — BP 132/82 | HR 65 | Ht 71.0 in | Wt 203.0 lb

## 2018-03-06 DIAGNOSIS — F4001 Agoraphobia with panic disorder: Secondary | ICD-10-CM

## 2018-03-06 MED ORDER — SERTRALINE HCL 50 MG PO TABS: ORAL_TABLET | ORAL | 2 refills | Status: DC

## 2018-03-06 MED ORDER — ALPRAZOLAM 0.5 MG PO TABS: ORAL_TABLET | ORAL | 2 refills | Status: DC

## 2018-03-06 NOTE — Progress Notes (Signed)
BH MD/PA/NP OP Progress Note  03/06/2018 11:08 AM Richard [name]  MRN:  [id number]  Chief Complaint:  Chief Complaint    Anxiety; Follow-up     HPI:  This patient is a 52 year old Caucasian male who came for his followup appointment. He is currently living with his wife in [place]. He is unemployed and applying for disability.  The patient states that he has been employed for 5 years. 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 He got injured on the job and both shoulders were torn. He has not been able to work ever since and he feels very badly about this. 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  Since he's not been able to work the patient has been having increasingly depressed and anxious. He feels like his body gives out when he is trying to exert himself even in a minor way.  The patient returns after 4 weeks.  He is seen today as a work in.  He states that his anxiety is getting worse despite being on Xanax.  He states that he cannot do the things he used to do and is extremely frustrating.  He is try to work out in the heat.  The temperature has been in the 90s with a high heat index and he does not seem to realize this is not appropriate for him.  Now he states whenever he goes outside he feels overwhelmed like he is suffocating.  He feels like he is stuck in his house indefinitely.  He states even when he sits in the house he feels overwhelming anxiety.  I have suggested again like I have in the past that he get on an SSRI to help with chronic anxiety and he states he is scared about this.  I explained that SSRIs have less side effects and Xanax and he states he would be willing to try it. Visit Diagnosis:    ICD-10-CM   1. 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 Arthritis    "legs,  spine, shoulders" (08/12/2015)  . Asthma   . Colitis 1990  . COPD (chronic obstructive pulmonary disease) ([place])   . Depression   . Gastric ulcer 2003; 2012   2003: + esophagitis; negative H.pylori serology  2012: Dr. Oneida Alar, mild gastritis, Bravo PH probe placement, negative H.pylori  . GERD (gastroesophageal reflux disease)   . Hepatic steatosis   . History of hiatal hernia   . Hyperlipemia   . Hypertension   . Mild CAD    a. Cardiac cath 07/2015 showed 65% distal Cx, 20% mid-distal LAD, 20% prox-distal RCA, EF 60%, EDP 77mmHg.  [name] Overweight   . Panic attacks   . Paroxysmal atrial fibrillation (HCC)   . Stroke [facility])    mucsle in left side of face dosent work as its supposed to-only deficit.  [name] TIA (transient ischemic attack)    after cath 1/ 17  . Tobacco abuse    1/2 pack per day  . Type II diabetes mellitus ([place])     Past Surgical History:  Procedure Laterality Date  . BRAVO [place] STUDY  05/03/2011   IWP:YKDX gastritis/normal esophagus and duodenum  . CARDIAC CATHETERIZATION  1990s X 1; 2005; 08/12/2015  . CARDIAC CATHETERIZATION N/A 08/12/2015   Procedure: Left Heart  Cath and Coronary Angiography;  Surgeon: Belva Crome, MD; LAD 20%, CFX 65%, RCA 20%, EF 60%   . COLONOSCOPY  1990  . COLONOSCOPY WITH PROPOFOL N/A 11/21/2016   Dr. Oneida Alar: non-thrombosed external hemorrhoids, one 6 mm polyp (polypoid lesion), internal hemorrhoids. TI Normal. 10 years screening  . ESOPHAGOGASTRODUODENOSCOPY  05/03/2011   JWJ:XBJY gastritis  . NECK MASS EXCISION Right    "done in dr's office; behind right ear/side of ncek"  . POLYPECTOMY  11/21/2016   Procedure: POLYPECTOMY;  Surgeon: [name], MD;  Location: [facility];  Service: Endoscopy;;  descending colon polyp  . SHOULDER ARTHROSCOPY W/ ROTATOR CUFF REPAIR Right 2006   acromioclavicular joint arthrosis    Family Psychiatric History: See below  Family History:  Family History  Problem Relation Age of Onset  . Lung cancer Mother    . Alcohol abuse Mother   . Heart attack Father 54  . Diabetes Father   . Alcohol abuse Father   . Hypertension Brother   . Hypertension Brother   . Anxiety disorder Sister   . Depression Sister   . Anxiety disorder Sister   . Heart attack Brother 17  . Diabetes Brother   . Hypertension Brother   . Seizures Brother   . Dementia Paternal Uncle   . Dementia Cousin   . ADD / ADHD Daughter   . Colon cancer Neg Hx   . Drug abuse Neg Hx   . Bipolar disorder Neg Hx   . OCD Neg Hx   . Paranoid behavior Neg Hx   . Schizophrenia Neg Hx   . Sexual abuse Neg Hx   . Physical abuse Neg Hx     Social History:  Social History   Socioeconomic History  . Marital status: Married    Spouse name: Not on file  . Number of children: Not on file  . Years of education: Not on file  . Highest education level: Not on file  Occupational History  . Occupation: full time    Employer: UNEMPLOYED  Social Needs  . Financial resource strain: Not on file  . Food insecurity:    Worry: Not on file    Inability: Not on file  . Transportation needs:    Medical: Not on file    Non-medical: Not on file  Tobacco Use  . Smoking status: Current Every Day Smoker    Packs/day: 0.25    Years: 25.00    Pack years: 6.25    Types: Cigarettes    Start date: 07/17/1982  . Smokeless tobacco: Never Used  Substance and Sexual Activity  . Alcohol use: No    Alcohol/week: 0.0 standard drinks  . Drug use: No  . Sexual activity: Yes    Birth control/protection: None  Lifestyle  . Physical activity:    Days per week: Not on file    Minutes per session: Not on file  . Stress: Not on file  Relationships  . Social connections:    Talks on phone: Not on file    Gets together: Not on file    Attends religious service: Not on file    Active member of club or organization: Not on file    Attends meetings of clubs or organizations: Not on file    Relationship status: Not on file  Other Topics Concern  . Not on  file  Social History Narrative   Pt lives in [place] with wife.  5 children.  Unemployed due to  panic attacks and back pain    Allergies:  Allergies  Allergen Reactions  . Dexilant [Dexlansoprazole] Anaphylaxis  . Mushroom Ext Cmplx(Shiitake-Reishi-Mait) Anaphylaxis    Rapid heart rate.  . Penicillins Anaphylaxis    Has patient had a PCN reaction causing immediate rash, facial/tongue/throat swelling, SOB or lightheadedness with hypotension: Yes Has patient had a PCN reaction causing severe rash involving mucus membranes or skin necrosis: No Has patient had a PCN reaction that required hospitalization Yes Has patient had a PCN reaction occurring within the last 10 years: No If all of the above answers are "NO", then may proceed with Cephalosporin use.   [name] Doxycycline     Metabolic Disorder Labs: Lab Results  Component Value Date   HGBA1C 9.1 (H) 01/24/2018   MPG 214.47 01/24/2018   MPG 205.86 05/29/2017   No results found for: PROLACTIN Lab Results  Component Value Date   CHOL 126 01/24/2018   TRIG 111 01/24/2018   HDL 30 (L) 01/24/2018   CHOLHDL 4.2 01/24/2018   VLDL 22 01/24/2018   LDLCALC 74 01/24/2018   LDLCALC 91 05/29/2017   Lab Results  Component Value Date   TSH 0.958 04/21/2011   TSH 1.402 05/27/2009    Therapeutic Level Labs: No results found for: LITHIUM No results found for: VALPROATE No components found for:  CBMZ  Current Medications: Current Outpatient Medications  Medication Sig Dispense Refill  . acetaminophen (TYLENOL) 325 MG tablet Take 650 mg by mouth every 6 (six) hours as needed for pain or fever.     [name] albuterol (PROVENTIL HFA;VENTOLIN HFA) 108 (90 Base) MCG/ACT inhaler Inhale 2 puffs into the lungs every 6 (six) hours as needed for wheezing or shortness of breath. 3 Inhaler 0  . ALPRAZolam (XANAX) 0.5 MG tablet Take one tablet by mouth three times a day and two tablets at night 150 tablet 2  . budesonide-formoterol (SYMBICORT)  80-4.5 MCG/ACT inhaler Inhale 2 puffs into the lungs 2 (two) times daily as needed.     . calcium carbonate (TUMS - DOSED IN MG ELEMENTAL CALCIUM) 500 MG chewable tablet Chew 1 tablet by mouth daily as needed for indigestion or heartburn.    . cyclobenzaprine (FLEXERIL) 10 MG tablet Take 1 tablet (10 mg total) by mouth 3 (three) times daily. (Patient taking differently: Take 10 mg by mouth 3 (three) times daily as needed for muscle spasms. ) 21 tablet 0  . esomeprazole (NEXIUM) 20 MG capsule Take 40 mg by mouth every morning.     [name] HYDROcodone-acetaminophen (NORCO/VICODIN) 5-325 MG tablet Take one to two tablets every 4-6 hours as needed for pain 40 tablet 0  . losartan (COZAAR) 25 MG tablet 03/01/18 HOLD per D DUNN PA-C 90 tablet 3  . metFORMIN (GLUCOPHAGE) 500 MG tablet Take 500 mg by mouth daily.    . metoprolol succinate (TOPROL-XL) 50 MG 24 hr tablet TAKE 1 AND A HALF TABLETS BY MOUTH EVERY DAY 45 tablet 9  . nitroGLYCERIN (NITROSTAT) 0.4 MG SL tablet Place 1 tablet (0.4 mg total) under the tongue every 5 (five) minutes x 3 doses as needed for chest pain (if no relief after 3rd dose, proceed to the ED for an evaluation). 25 tablet 3  . simvastatin (ZOCOR) 20 MG tablet TAKE 1 TABLET BY MOUTH AT BEDTIME 30 tablet 4  . sitaGLIPtin (JANUVIA) 100 MG tablet Take 1 tablet (100 mg total) by mouth daily. 90 tablet 1  . XARELTO 20 MG TABS tablet TAKE 1 Tablet BY MOUTH  ONCE DAILY WITH SUPPER 90 tablet 3  . sertraline (ZOLOFT) 50 MG tablet Take one half tablet daily for one week then increase to one tablet daily 30 tablet 2   No current facility-administered medications for this visit.      Musculoskeletal: Strength & Muscle Tone: within normal limits Gait & Station: normal Patient leans: N/A  Psychiatric Specialty Exam: Review of Systems  Constitutional: Positive for malaise/fatigue.  Respiratory: Positive for shortness of breath.   Musculoskeletal: Positive for back pain, joint pain and neck  pain.  Psychiatric/Behavioral: The patient is nervous/anxious.   All other systems reviewed and are negative.   Blood pressure 132/82, pulse 65, height 5\' 11"  (1.803 m), weight 203 lb (92.1 kg), SpO2 97 %.Body mass index is 28.31 kg/m.  General Appearance: Casual and Fairly Groomed  Eye Contact:  Good  Speech:  Clear and Coherent  Volume:  Normal  Mood:  Anxious and Irritable  Affect:  Congruent  Thought Process:  Goal Directed  Orientation:  Full (Time, Place, and Person)  Thought Content: Rumination   Suicidal Thoughts:  No  Homicidal Thoughts:  No  Memory:  Immediate;   Good Recent;   Good Remote;   Fair  Judgement:  Fair  Insight:  Lacking  Psychomotor Activity:  Decreased  Concentration:  Concentration: Fair and Attention Span: Fair  Recall:  [employer] of Knowledge: Fair  Language: Good  Akathisia:  No  Handed:  Right  AIMS (if indicated): not done  Assets:  Communication Skills Desire for Improvement Resilience Social Support Talents/Skills  ADL's:  Intact  Cognition: WNL  Sleep:  Fair   Screenings: PHQ2-9     Patient Outreach Telephone from 08/30/2015 in [facility] Patient Outreach Telephone from 08/26/2015 in [facility]  PHQ-2 Total Score  2  4  PHQ-9 Total Score  8  8       Assessment and Plan: This patient is a 52 year old male with a history of anxiety.  This seems to be getting worse now that his physical limitations have prevented him from doing things he wants or needs to do.  For now he will continue Xanax at the current dosage.  He will also start Zoloft 25 mg for 1 week and then advance to 50 mg daily.  He will return to see me in 4 weeks   [name], MD 03/06/2018, 11:08 AM

## 2018-03-07 ENCOUNTER — Ambulatory Visit: Payer: Self-pay | Admitting: Physician Assistant

## 2018-03-12 ENCOUNTER — Ambulatory Visit (INDEPENDENT_AMBULATORY_CARE_PROVIDER_SITE_OTHER): Payer: Self-pay | Admitting: General Surgery

## 2018-03-12 ENCOUNTER — Encounter: Payer: Self-pay | Admitting: General Surgery

## 2018-03-12 VITALS — BP 144/79 | HR 59 | Temp 96.9°F | Resp 20 | Wt 205.0 lb

## 2018-03-12 DIAGNOSIS — K644 Residual hemorrhoidal skin tags: Secondary | ICD-10-CM

## 2018-03-12 NOTE — Patient Instructions (Signed)
Hemorrhoids Hemorrhoids are swollen veins in and around the rectum or anus. There are two types of hemorrhoids:  Internal hemorrhoids. These occur in the veins that are just inside the rectum. They may poke through to the outside and become irritated and painful.  External hemorrhoids. These occur in the veins that are outside of the anus and can be felt as a painful swelling or hard lump near the anus.  Most hemorrhoids do not cause serious problems, and they can be managed with home treatments such as diet and lifestyle changes. If home treatments do not help your symptoms, procedures can be done to shrink or remove the hemorrhoids. What are the causes? This condition is caused by increased pressure in the anal area. This pressure may result from various things, including:  Constipation.  Straining to have a bowel movement.  Diarrhea.  Pregnancy.  Obesity.  Sitting for long periods of time.  Heavy lifting or other activity that causes you to strain.  Anal sex.  What are the signs or symptoms? Symptoms of this condition include:  Pain.  Anal itching or irritation.  Rectal bleeding.  Leakage of stool (feces).  Anal swelling.  One or more lumps around the anus.  How is this diagnosed? This condition can often be diagnosed through a visual exam. Other exams or tests may also be done, such as:  Examination of the rectal area with a gloved hand (digital rectal exam).  Examination of the anal canal using a small tube (anoscope).  A blood test, if you have lost a significant amount of blood.  A test to look inside the colon (sigmoidoscopy or colonoscopy).  How is this treated? This condition can usually be treated at home. However, various procedures may be done if dietary changes, lifestyle changes, and other home treatments do not help your symptoms. These procedures can help make the hemorrhoids smaller or remove them completely. Some of these procedures involve  surgery, and others do not. Common procedures include:  Rubber band ligation. Rubber bands are placed at the base of the hemorrhoids to cut off the blood supply to them.  Sclerotherapy. Medicine is injected into the hemorrhoids to shrink them.  Infrared coagulation. A type of light energy is used to get rid of the hemorrhoids.  Hemorrhoidectomy surgery. The hemorrhoids are surgically removed, and the veins that supply them are tied off.  Stapled hemorrhoidopexy surgery. A circular stapling device is used to remove the hemorrhoids and use staples to cut off the blood supply to them.  Follow these instructions at home: Eating and drinking  Eat foods that have a lot of fiber in them, such as whole grains, beans, nuts, fruits, and vegetables. Ask your health care provider about taking products that have added fiber (fiber supplements).  Drink enough fluid to keep your urine clear or pale yellow. Managing pain and swelling  Take warm sitz baths for 20 minutes, 3-4 times a day to ease pain and discomfort.  If directed, apply ice to the affected area. Using ice packs between sitz baths may be helpful. ? Put ice in a plastic bag. ? Place a towel between your skin and the bag. ? Leave the ice on for 20 minutes, 2-3 times a day. General instructions  Take over-the-counter and prescription medicines only as told by your health care provider.  Use medicated creams or suppositories as told.  Exercise regularly.  Go to the bathroom when you have the urge to have a bowel movement. Do not wait.    Avoid straining to have bowel movements.  Keep the anal area dry and clean. Use wet toilet paper or moist towelettes after a bowel movement.  Do not sit on the toilet for long periods of time. This increases blood pooling and pain. Contact a health care provider if:  You have increasing pain and swelling that are not controlled by treatment or medicine.  You have uncontrolled bleeding.  You  have difficulty having a bowel movement, or you are unable to have a bowel movement.  You have pain or inflammation outside the area of the hemorrhoids. This information is not intended to replace advice given to you by your health care provider. Make sure you discuss any questions you have with your health care provider. Document Released: 06/30/2000 Document Revised: 12/01/2015 Document Reviewed: 03/17/2015 Elsevier Interactive Patient Education  2018 Elsevier Inc.  

## 2018-03-12 NOTE — Progress Notes (Signed)
Richard [name]; [id number]; 01/04/1966   HPI Patient is a 52 year old white male who was referred to my care by Richard [name] for evaluation treatment of hemorrhoidal disease.  Patient states he has had intermittent hemorrhoidal issues for some time now.  He states that sometimes he developed swelling in the rectum.  He has bowel movements daily.  He is on a blood thinner due to a history of atrial fibrillation and TIAs, though he has not had any active rectal bleeding recently.  He has had internal banding endoscopically in the past.  He currently has no rectal pain. Past Medical History:  Diagnosis Date  . Arthritis    "legs, spine, shoulders" (08/12/2015)  . Asthma   . Colitis 1990  . COPD (chronic obstructive pulmonary disease) ([place])   . Depression   . Gastric ulcer 2003; 2012   2003: + esophagitis; negative H.pylori serology  2012: Dr. Oneida Alar, mild gastritis, Bravo PH probe placement, negative H.pylori  . GERD (gastroesophageal reflux disease)   . Hepatic steatosis   . History of hiatal hernia   . Hyperlipemia   . Hypertension   . Mild CAD    a. Cardiac cath 07/2015 showed 65% distal Cx, 20% mid-distal LAD, 20% prox-distal RCA, EF 60%, EDP 64mmHg.  Richard [name] Overweight   . Panic attacks   . Paroxysmal atrial fibrillation (HCC)   . Stroke Richard [name])    mucsle in left side of face dosent work as its supposed to-only deficit.  Richard [name] TIA (transient ischemic attack)    after cath 1/ 17  . Tobacco abuse    1/2 pack per day  . Type II diabetes mellitus ([place])     Past Surgical History:  Procedure Laterality Date  . BRAVO [place] STUDY  05/03/2011   BWI:OMBT gastritis/normal esophagus and duodenum  . CARDIAC CATHETERIZATION  1990s X 1; 2005; 08/12/2015  . CARDIAC CATHETERIZATION N/A 08/12/2015   Procedure: Left Heart Cath and Coronary Angiography;  Surgeon: Belva Crome, MD; LAD 20%, CFX 65%, RCA 20%, EF 60%   . COLONOSCOPY  1990  . COLONOSCOPY WITH PROPOFOL N/A 11/21/2016   Dr. Oneida Alar: non-thrombosed  external hemorrhoids, one 6 mm polyp (polypoid lesion), internal hemorrhoids. TI Normal. 10 years screening  . ESOPHAGOGASTRODUODENOSCOPY  05/03/2011   DHR:CBUL gastritis  . NECK MASS EXCISION Right    "done in dr's office; behind right ear/side of ncek"  . POLYPECTOMY  11/21/2016   Procedure: POLYPECTOMY;  Surgeon: Richard Binder, MD;  Location[facility];  Service: Endoscopy;;  descending colon polyp  . SHOULDER ARTHROSCOPY W/ ROTATOR CUFF REPAIR Right 2006   acromioclavicular joint arthrosis    Family History  Problem Relation Age of Onset  . Lung cancer Mother   . Alcohol abuse Mother   . Heart attack Father 81  . Diabetes Father   . Alcohol abuse Father   . Hypertension Brother   . Hypertension Brother   . Anxiety disorder Sister   . Depression Sister   . Anxiety disorder Sister   . Heart attack Brother 58  . Diabetes Brother   . Hypertension Brother   . Seizures Brother   . Dementia Paternal Uncle   . Dementia Cousin   . ADD / ADHD Daughter   . Colon cancer Neg Hx   . Drug abuse Neg Hx   . Bipolar disorder Neg Hx   . OCD Neg Hx   . Paranoid behavior Neg Hx   . Schizophrenia Neg Hx   . Sexual  abuse Neg Hx   . Physical abuse Neg Hx     Current Outpatient Medications on File Prior to Visit  Medication Sig Dispense Refill  . acetaminophen (TYLENOL) 325 MG tablet Take 650 mg by mouth every 6 (six) hours as needed for pain or fever.     Richard [name] albuterol (PROVENTIL HFA;VENTOLIN HFA) 108 (90 Base) MCG/ACT inhaler Inhale 2 puffs into the lungs every 6 (six) hours as needed for wheezing or shortness of breath. 3 Inhaler 0  . ALPRAZolam (XANAX) 0.5 MG tablet Take one tablet by mouth three times a day and two tablets at night 150 tablet 2  . budesonide-formoterol (SYMBICORT) 80-4.5 MCG/ACT inhaler Inhale 2 puffs into the lungs 2 (two) times daily as needed.     . calcium carbonate (TUMS - DOSED IN MG ELEMENTAL CALCIUM) 500 MG chewable tablet Chew 1 tablet by mouth daily as  needed for indigestion or heartburn.    . cyclobenzaprine (FLEXERIL) 10 MG tablet Take 1 tablet (10 mg total) by mouth 3 (three) times daily. (Patient taking differently: Take 10 mg by mouth 3 (three) times daily as needed for muscle spasms. ) 21 tablet 0  . esomeprazole (NEXIUM) 20 MG capsule Take 40 mg by mouth every morning.     Richard [name] HYDROcodone-acetaminophen (NORCO/VICODIN) 5-325 MG tablet Take one to two tablets every 4-6 hours as needed for pain 40 tablet 0  . losartan (COZAAR) 25 MG tablet 03/01/18 HOLD per D DUNN PA-C 90 tablet 3  . metoprolol succinate (TOPROL-XL) 50 MG 24 hr tablet TAKE 1 AND A HALF TABLETS BY MOUTH EVERY DAY 45 tablet 9  . nitroGLYCERIN (NITROSTAT) 0.4 MG SL tablet Place 1 tablet (0.4 mg total) under the tongue every 5 (five) minutes x 3 doses as needed for chest pain (if no relief after 3rd dose, proceed to the ED for an evaluation). 25 tablet 3  . sertraline (ZOLOFT) 50 MG tablet Take one half tablet daily for one week then increase to one tablet daily 30 tablet 2  . simvastatin (ZOCOR) 20 MG tablet TAKE 1 TABLET BY MOUTH AT BEDTIME 30 tablet 4  . sitaGLIPtin (JANUVIA) 100 MG tablet Take 1 tablet (100 mg total) by mouth daily. 90 tablet 1  . XARELTO 20 MG TABS tablet TAKE 1 Tablet BY MOUTH ONCE DAILY WITH SUPPER 90 tablet 3  . metFORMIN (GLUCOPHAGE) 500 MG tablet Take 500 mg by mouth daily.     No current facility-administered medications on file prior to visit.     Allergies  Allergen Reactions  . Dexilant [Dexlansoprazole] Anaphylaxis  . Mushroom Ext Cmplx(Shiitake-Reishi-Mait) Anaphylaxis    Rapid heart rate.  . Penicillins Anaphylaxis    Has patient had a PCN reaction causing immediate rash, facial/tongue/throat swelling, SOB or lightheadedness with hypotension: Yes Has patient had a PCN reaction causing severe rash involving mucus membranes or skin necrosis: No Has patient had a PCN reaction that required hospitalization Yes Has patient had a PCN reaction  occurring within the last 10 years: No If all of the above answers are "NO", then may proceed with Cephalosporin use.   Richard [name] Doxycycline     Social History   Substance and Sexual Activity  Alcohol Use No  . Alcohol/week: 0.0 standard drinks    Social History   Tobacco Use  Smoking Status Current Every Day Smoker  . Packs/day: 0.25  . Years: 25.00  . Pack years: 6.25  . Types: Cigarettes  . Start date: 07/17/1982  Smokeless Tobacco Never Used  Review of Systems  Constitutional: Positive for malaise/fatigue.  HENT: Positive for sinus pain.   Eyes: Negative.   Respiratory: Positive for cough, shortness of breath and wheezing.   Cardiovascular: Negative.   Gastrointestinal: Positive for abdominal pain and heartburn.  Genitourinary: Positive for dysuria and frequency.  Musculoskeletal: Positive for back pain, joint pain and neck pain.  Skin: Negative.   Neurological: Negative.   Endo/Heme/Allergies: Bruises/bleeds easily.  Psychiatric/Behavioral: Negative.     Objective   Vitals:   03/12/18 0957  BP: (!) 144/79  Pulse: (!) 59  Resp: 20  Temp: (!) 96.9 F (36.1 C)    Physical Exam  Constitutional: He is oriented to person, place, and time. He appears well-developed and well-nourished. No distress.  HENT:  Head: Normocephalic and atraumatic.  Cardiovascular: Normal rate, regular rhythm and normal heart sounds. Exam reveals no friction rub.  No murmur heard. Pulmonary/Chest: Effort normal and breath sounds normal. No stridor. No respiratory distress. He has no wheezes. He has no rales.  Abdominal: Soft. Bowel sounds are normal. He exhibits no distension and no mass. There is no tenderness. There is no guarding.  Genitourinary:  Genitourinary Comments: Rectal examination reveals normal sphincter tone.  An external hemorrhoidal skin tag is noted on the right side of the anus.  No perianal irritation noted.  No bleeding noted.  No internal hemorrhoids palpated.   Neurological: He is alert and oriented to person, place, and time.  Skin: Skin is warm and dry.  Vitals reviewed. Previous GI notes reviewed  Assessment  External hemorrhoidal skin tag, no complication Plan   I told the patient that I would not recommend hemorrhoidectomy at this time.  He understands and agrees.  He is relieved to hear he does not need surgery.  I did give him instructions on avoiding constipation and using medicated wipes as needed.  He was instructed to follow-up with me as needed.

## 2018-03-13 ENCOUNTER — Encounter: Payer: Self-pay | Admitting: Gastroenterology

## 2018-03-13 ENCOUNTER — Telehealth: Payer: Self-pay | Admitting: Gastroenterology

## 2018-03-13 NOTE — Telephone Encounter (Signed)
Pt's wife, [name], called with questions about patient's procedure next Wednesday at [facility] that [facility] had ordered. She has questions about his diabetic medicine and Xerelto. Please call [phone]

## 2018-03-13 NOTE — Telephone Encounter (Signed)
I called and told pt to contact the ones that scheduled his Manometry for questions about his meds on that day.

## 2018-03-13 NOTE — Telephone Encounter (Signed)
Called pt to get an update on symptoms. No answer. Left message for pt to return call as soon as he could.

## 2018-03-19 NOTE — Progress Notes (Signed)
pts wife called and asked tor reschedule esophageal manometry that was scheduled for tomorrow.  Rescheduled with wife for October 11th at 10:30. Went over instructions with wife. She stated she understood.

## 2018-03-21 ENCOUNTER — Telehealth: Payer: Self-pay | Admitting: Gastroenterology

## 2018-03-21 NOTE — Telephone Encounter (Signed)
Pt calling for more Nexium samples. [phone]

## 2018-03-21 NOTE — Telephone Encounter (Signed)
Pt is aware I am leaving 4 boxes of Nexium ( #8 tablets) at front for pick up. These are all we have and I am also leaving some coupons for him.

## 2018-04-04 ENCOUNTER — Other Ambulatory Visit (HOSPITAL_COMMUNITY)
Admission: RE | Admit: 2018-04-04 | Discharge: 2018-04-04 | Disposition: A | Discharge status: Home / Self Care | Payer: Self-pay | Source: Ambulatory Visit | Attending: Orthopaedic Surgery | Admitting: Orthopaedic Surgery

## 2018-04-04 ENCOUNTER — Ambulatory Visit (INDEPENDENT_AMBULATORY_CARE_PROVIDER_SITE_OTHER): Payer: Self-pay

## 2018-04-04 ENCOUNTER — Ambulatory Visit (INDEPENDENT_AMBULATORY_CARE_PROVIDER_SITE_OTHER): Payer: Self-pay | Admitting: Orthopaedic Surgery

## 2018-04-04 ENCOUNTER — Encounter (INDEPENDENT_AMBULATORY_CARE_PROVIDER_SITE_OTHER): Payer: Self-pay | Admitting: Orthopaedic Surgery

## 2018-04-04 ENCOUNTER — Telehealth: Payer: Self-pay | Admitting: Gastroenterology

## 2018-04-04 VITALS — BP 127/76 | HR 56 | Ht 71.0 in | Wt 203.0 lb

## 2018-04-04 DIAGNOSIS — M25551 Pain in right hip: Secondary | ICD-10-CM

## 2018-04-04 DIAGNOSIS — M79641 Pain in right hand: Secondary | ICD-10-CM

## 2018-04-04 DIAGNOSIS — M25552 Pain in left hip: Secondary | ICD-10-CM

## 2018-04-04 DIAGNOSIS — M2550 Pain in unspecified joint: Secondary | ICD-10-CM | POA: Insufficient documentation

## 2018-04-04 LAB — CBC
HEMATOCRIT: 47.4 % (ref 39.0–52.0)
HEMOGLOBIN: 16.7 g/dL (ref 13.0–17.0)
MCH: 32.4 pg (ref 26.0–34.0)
MCHC: 35.2 g/dL (ref 30.0–36.0)
MCV: 91.9 fL (ref 78.0–100.0)
Platelets: 187 10*3/uL (ref 150–400)
RBC: 5.16 MIL/uL (ref 4.22–5.81)
RDW: 12.3 % (ref 11.5–15.5)
WBC: 7.6 10*3/uL (ref 4.0–10.5)

## 2018-04-04 LAB — C-REACTIVE PROTEIN: CRP: 1 mg/dL — AB (ref <1.0)

## 2018-04-04 LAB — DIFFERENTIAL
BASOS ABS: 0 10*3/uL (ref 0.0–0.1)
BASOS PCT: 0 %
Eosinophils Absolute: 0.1 10*3/uL (ref 0.0–0.7)
Eosinophils Relative: 2 %
LYMPHS PCT: 32 %
Lymphs Abs: 2.4 10*3/uL (ref 0.7–4.0)
MONO ABS: 0.6 10*3/uL (ref 0.1–1.0)
Monocytes Relative: 7 %
NEUTROS ABS: 4.4 10*3/uL (ref 1.7–7.7)
NEUTROS PCT: 59 %

## 2018-04-04 LAB — SEDIMENTATION RATE: Sed Rate: 12 mm/hr (ref 0–16)

## 2018-04-04 LAB — URIC ACID: URIC ACID, SERUM: 2.9 mg/dL — AB (ref 3.7–8.6)
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
## 2018-04-04 NOTE — Telephone Encounter (Signed)
Pt is aware that we do not have any samples of Nexium.

## 2018-04-04 NOTE — Telephone Encounter (Signed)
Pt's wife Adonis Brook called to see if we have anymore Nexium samples and to let her know soon because she was in town and was going to stop by. [phone]

## 2018-04-05 LAB — ANA: ANA: NEGATIVE

## 2018-04-05 LAB — RHEUMATOID FACTOR: Rhuematoid fact SerPl-aCnc: <10 IU/mL (ref 0.0–13.9)

## 2018-04-09 ENCOUNTER — Encounter (HOSPITAL_COMMUNITY): Payer: Self-pay | Admitting: Psychiatry

## 2018-04-09 ENCOUNTER — Ambulatory Visit (INDEPENDENT_AMBULATORY_CARE_PROVIDER_SITE_OTHER): Payer: Self-pay | Admitting: Psychiatry

## 2018-04-09 ENCOUNTER — Telehealth: Payer: Self-pay | Admitting: Cardiology

## 2018-04-09 VITALS — BP 136/76 | HR 60 | Ht 71.0 in | Wt 206.0 lb

## 2018-04-09 DIAGNOSIS — F17210 Nicotine dependence, cigarettes, uncomplicated: Secondary | ICD-10-CM

## 2018-04-09 DIAGNOSIS — F419 Anxiety disorder, unspecified: Secondary | ICD-10-CM

## 2018-04-09 DIAGNOSIS — Z560 Unemployment, unspecified: Secondary | ICD-10-CM

## 2018-04-09 DIAGNOSIS — F4001 Agoraphobia with panic disorder: Secondary | ICD-10-CM

## 2018-04-09 MED ORDER — ALPRAZOLAM 0.5 MG PO TABS: ORAL_TABLET | ORAL | 2 refills | Status: DC

## 2018-04-09 NOTE — Telephone Encounter (Signed)
Pt will call pharmacy to see if his brand was affected .

## 2018-04-09 NOTE — Telephone Encounter (Signed)
Patient's wife states that patient was started on Losartan and wants to know if this affected by recall. / tg

## 2018-04-09 NOTE — Progress Notes (Signed)
[name] Progress Note  04/09/2018 11:12 AM Richard [name]  MRN:  [id number]  Chief Complaint:  Chief Complaint    Depression; Anxiety; Follow-up     HPI: Thispatient is a 52 year old Caucasian male who came for his followup appointment. He is currently living with his wife in [place]. He is unemployed and applying for disability.  The patient states that he has been employed for 5 years. 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 He got injured on the job and both shoulders were torn. He has not been able to work ever since and he feels very badly about this. 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  Since he's not been able to work the patient has been having increasingly depressed and anxious. He feels like his body gives out when he is trying to exert himself even in a minor way.  She returns after 4 weeks.  He was very frustrated and anxious last time.  He could not tolerate the heat and could not do things that he wanted to do around the house.  I added Zoloft to his regimen but he claims he never picked it up because he could not afford it.  He states that he and his wife are at a very bad place financially.  They are barely making the rent.  They get food stamps but they cannot make their other bills.  He does get charity care to come here and other facilities at [facility].  Right now he is states he would just rather stay on the Xanax as it does help his anxiety.  Sometimes he tries to overdo things like mowing his lawn and he needs to pace himself.  He is very frustrated with never being approved for disability. Visit Diagnosis:    ICD-10-CM   1. 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 Arthritis    "legs, spine, shoulders" (08/12/2015)  . Asthma   . Colitis 1990  . COPD  (chronic obstructive pulmonary disease) ([place])   . Depression   . Gastric ulcer 2003; 2012   2003: + esophagitis; negative H.pylori serology  2012: Dr. Oneida Alar, mild gastritis, Bravo PH probe placement, negative H.pylori  . GERD (gastroesophageal reflux disease)   . Hepatic steatosis   . History of hiatal hernia   . Hyperlipemia   . Hypertension   . Mild CAD    a. Cardiac cath 07/2015 showed 65% distal Cx, 20% mid-distal LAD, 20% prox-distal RCA, EF 60%, EDP 96mmHg.  [name] Overweight   . Panic attacks   . Paroxysmal atrial fibrillation (HCC)   . Stroke [facility])    mucsle in left side of face dosent work as its supposed to-only deficit.  [name] TIA (transient ischemic attack)    after cath 1/ 17  . Tobacco abuse    1/2 pack per day  . Type II diabetes mellitus ([place])     Past Surgical History:  Procedure Laterality Date  . BRAVO [place] STUDY  05/03/2011   WUJ:WJXB gastritis/normal esophagus and duodenum  . CARDIAC CATHETERIZATION  1990s X 1; 2005; 08/12/2015  . CARDIAC CATHETERIZATION N/A 08/12/2015   Procedure: Left Heart Cath and Coronary Angiography;  Surgeon: Belva Crome, MD; LAD 20%, CFX 65%, RCA 20%, EF 60%   .  COLONOSCOPY  1990  . COLONOSCOPY WITH PROPOFOL N/A 11/21/2016   Dr. Oneida Alar: non-thrombosed external hemorrhoids, one 6 mm polyp (polypoid lesion), internal hemorrhoids. TI Normal. 10 years screening  . ESOPHAGOGASTRODUODENOSCOPY  05/03/2011   GBT:DVVO gastritis  . NECK MASS EXCISION Right    "done in dr's office; behind right ear/side of ncek"  . POLYPECTOMY  11/21/2016   Procedure: POLYPECTOMY;  Surgeon: [name], MD;  Location: [facility] ENDO SUITE;  Service: Endoscopy;;  descending colon polyp  . SHOULDER ARTHROSCOPY W/ ROTATOR CUFF REPAIR Right 2006   acromioclavicular joint arthrosis    Family Psychiatric History: See below  Family History:  Family History  Problem Relation Age of Onset  . Lung cancer Mother   . Alcohol abuse Mother   . Heart attack Father 21  . Diabetes  Father   . Alcohol abuse Father   . Hypertension Brother   . Hypertension Brother   . Anxiety disorder Sister   . Depression Sister   . Anxiety disorder Sister   . Heart attack Brother 54  . Diabetes Brother   . Hypertension Brother   . Seizures Brother   . Dementia Paternal Uncle   . Dementia Cousin   . ADD / ADHD Daughter   . Colon cancer Neg Hx   . Drug abuse Neg Hx   . Bipolar disorder Neg Hx   . OCD Neg Hx   . Paranoid behavior Neg Hx   . Schizophrenia Neg Hx   . Sexual abuse Neg Hx   . Physical abuse Neg Hx     Social History:  Social History   Socioeconomic History  . Marital status: Married    Spouse name: Not on file  . Number of children: Not on file  . Years of education: Not on file  . Highest education level: Not on file  Occupational History  . Occupation: full time    Employer: UNEMPLOYED  Social Needs  . Financial resource strain: Not on file  . Food insecurity:    Worry: Not on file    Inability: Not on file  . Transportation needs:    Medical: Not on file    Non-medical: Not on file  Tobacco Use  . Smoking status: Current Every Day Smoker    Packs/day: 0.25    Years: 25.00    Pack years: 6.25    Types: Cigarettes    Start date: 07/17/1982  . Smokeless tobacco: Never Used  Substance and Sexual Activity  . Alcohol use: No    Alcohol/week: 0.0 standard drinks  . Drug use: No  . Sexual activity: Yes    Birth control/protection: None  Lifestyle  . Physical activity:    Days per week: Not on file    Minutes per session: Not on file  . Stress: Not on file  Relationships  . Social connections:    Talks on phone: Not on file    Gets together: Not on file    Attends religious service: Not on file    Active member of club or organization: Not on file    Attends meetings of clubs or organizations: Not on file    Relationship status: Not on file  Other Topics Concern  . Not on file  Social History Narrative   Pt lives in [place] with  wife.  5 children.  Unemployed due to panic attacks and back pain    Allergies:  Allergies  Allergen Reactions  . Dexilant [Dexlansoprazole] Anaphylaxis  .  Mushroom Ext Cmplx(Shiitake-Reishi-Mait) Anaphylaxis    Rapid heart rate.  . Penicillins Anaphylaxis    Has patient had a PCN reaction causing immediate rash, facial/tongue/throat swelling, SOB or lightheadedness with hypotension: Yes Has patient had a PCN reaction causing severe rash involving mucus membranes or skin necrosis: No Has patient had a PCN reaction that required hospitalization Yes Has patient had a PCN reaction occurring within the last 10 years: No If all of the above answers are "NO", then may proceed with Cephalosporin use.   [name] Doxycycline     Metabolic Disorder Labs: Lab Results  Component Value Date   HGBA1C 9.1 (H) 01/24/2018   MPG 214.47 01/24/2018   MPG 205.86 05/29/2017   No results found for: PROLACTIN Lab Results  Component Value Date   CHOL 126 01/24/2018   TRIG 111 01/24/2018   HDL 30 (L) 01/24/2018   CHOLHDL 4.2 01/24/2018   VLDL 22 01/24/2018   LDLCALC 74 01/24/2018   LDLCALC 91 05/29/2017   Lab Results  Component Value Date   TSH 0.958 04/21/2011   TSH 1.402 05/27/2009    Therapeutic Level Labs: No results found for: LITHIUM No results found for: VALPROATE No components found for:  CBMZ  Current Medications: Current Outpatient Medications  Medication Sig Dispense Refill  . acetaminophen (TYLENOL) 325 MG tablet Take 650 mg by mouth every 6 (six) hours as needed for pain or fever.     [name] albuterol (PROVENTIL HFA;VENTOLIN HFA) 108 (90 Base) MCG/ACT inhaler Inhale 2 puffs into the lungs every 6 (six) hours as needed for wheezing or shortness of breath. 3 Inhaler 0  . ALPRAZolam (XANAX) 0.5 MG tablet Take one tablet by mouth three times a day and two tablets at night 150 tablet 2  . amLODipine (NORVASC) 5 MG tablet Take 5 mg by mouth daily.  3  . budesonide-formoterol (SYMBICORT)  80-4.5 MCG/ACT inhaler Inhale 2 puffs into the lungs 2 (two) times daily as needed.     . calcium carbonate (TUMS - DOSED IN MG ELEMENTAL CALCIUM) 500 MG chewable tablet Chew 1 tablet by mouth daily as needed for indigestion or heartburn.    . cyclobenzaprine (FLEXERIL) 10 MG tablet Take 1 tablet (10 mg total) by mouth 3 (three) times daily. (Patient taking differently: Take 10 mg by mouth 3 (three) times daily as needed for muscle spasms. ) 21 tablet 0  . esomeprazole (NEXIUM) 20 MG capsule Take 40 mg by mouth every morning.     [name] HYDROcodone-acetaminophen (NORCO/VICODIN) 5-325 MG tablet Take one to two tablets every 4-6 hours as needed for pain 40 tablet 0  . losartan (COZAAR) 25 MG tablet 03/01/18 HOLD per D DUNN PA-C 90 tablet 3  . metFORMIN (GLUCOPHAGE) 500 MG tablet Take 500 mg by mouth daily.    . metoprolol succinate (TOPROL-XL) 50 MG 24 hr tablet TAKE 1 AND A HALF TABLETS BY MOUTH EVERY DAY 45 tablet 9  . nitroGLYCERIN (NITROSTAT) 0.4 MG SL tablet Place 1 tablet (0.4 mg total) under the tongue every 5 (five) minutes x 3 doses as needed for chest pain (if no relief after 3rd dose, proceed to the ED for an evaluation). 25 tablet 3  . simvastatin (ZOCOR) 20 MG tablet TAKE 1 TABLET BY MOUTH AT BEDTIME 30 tablet 4  . sitaGLIPtin (JANUVIA) 100 MG tablet Take 1 tablet (100 mg total) by mouth daily. 90 tablet 1  . XARELTO 20 MG TABS tablet TAKE 1 Tablet BY MOUTH ONCE DAILY WITH SUPPER 90 tablet  3   No current facility-administered medications for this visit.      Musculoskeletal: Strength & Muscle Tone: within normal limits Gait & Station: normal Patient leans: N/A  Psychiatric Specialty Exam: Review of Systems  Cardiovascular: Positive for palpitations.  Musculoskeletal: Positive for back pain, falls and joint pain.  Psychiatric/Behavioral: The patient is nervous/anxious.   All other systems reviewed and are negative.   Blood pressure 136/76, pulse 60, height 5\' 11"  (1.803 m), weight  206 lb (93.4 kg), SpO2 98 %.Body mass index is 28.73 kg/m.  General Appearance: Casual and Fairly Groomed  Eye Contact:  Good  Speech:  Clear and Coherent  Volume:  Normal  Mood:  Anxious  Affect:  Congruent  Thought Process:  Goal Directed  Orientation:  Full (Time, Place, and Person)  Thought Content: Rumination   Suicidal Thoughts:  No  Homicidal Thoughts:  No  Memory:  Immediate;   Good Recent;   Good Remote;   Good  Judgement:  Fair  Insight:  Fair  Psychomotor Activity:  Decreased  Concentration:  Concentration: Fair and Attention Span: Fair  Recall:  Good  Fund of Knowledge: Fair  Language: Good  Akathisia:  No  Handed:  Right  AIMS (if indicated): not done  Assets:  Communication Skills Desire for Improvement Resilience Social Support Talents/Skills  ADL's:  Intact  Cognition: WNL  Sleep:  Good   Screenings: PHQ2-9     Patient Outreach Telephone from 08/30/2015 in [facility] Patient Outreach Telephone from 08/26/2015 in [facility]  PHQ-2 Total Score  2  4  PHQ-9 Total Score  8  8       Assessment and Plan: Patient is a 52 year old male with a history of anxiety.  We try to get him on SSRIs numerous times but he always claims a cause side effects we can afford them.  He seems less anxious this time and claims that Xanax is working okay for him.  He will continue Xanax 0.5 mg 3 times daily and 1 mg at bedtime.  He will return to see me in 3 months.   [name], MD 04/09/2018, 11:12 AM

## 2018-04-17 ENCOUNTER — Telehealth: Payer: Self-pay | Admitting: Cardiovascular Disease

## 2018-04-17 DIAGNOSIS — R0602 Shortness of breath: Secondary | ICD-10-CM

## 2018-04-17 NOTE — Telephone Encounter (Signed)
Pts wife [name] left message stating pt is wanting an apt, that he's having some issues. Please call @ [phone]

## 2018-04-17 NOTE — Telephone Encounter (Signed)
Returned pt's wife's call. She states that her husband is having issues with his breathing  when he is active outside. He states that it seems like his oxygen drops, and then he gets short winded. His blood pressure has been doing well, and he is not having any other issues. His bp medication was changed "per wife" @ LOV thinking that was the reason for his increased SOB when he was outside, but he is still having same issues. Please advise.

## 2018-04-18 NOTE — Telephone Encounter (Signed)
I'm covering Richard [name]'s inbox while she is out.  I've reviewed chart. This may be due to his atrial fibrillation. How his his HR? Any palpitations? I see that he has an appt with Dr. Rayann Heman later this month that he should keep.   Also any weight gain, swelling or waking up at night short of breath?? If yes to any of these, then we can have him come to the lab for blood work  (BNP and BMP). Any abnormal bleeding (dark stools)? If yes get a CBC since he is on a blood thinner (Xarelto). Any chest pain??

## 2018-04-18 NOTE — Telephone Encounter (Signed)
Called pt. No answer. Left message for pt to return call.  

## 2018-04-19 ENCOUNTER — Telehealth: Payer: Self-pay

## 2018-04-19 NOTE — Telephone Encounter (Signed)
Called pt. No answer. Left message for pt to return call.  

## 2018-04-19 NOTE — Telephone Encounter (Signed)
Spoke with the spouse. Patient will be rescheduled to 05/08/18 at 10:30 am. She will notify the patient.

## 2018-04-23 NOTE — Telephone Encounter (Signed)
Pt's wife returned call. She states that pt does have some problems breathing  at night, as it does wake him up on occasion. He denies chest pain, dark stools, weight gain or swelling. Pt will have BNP. BMP done this week. Pt will kepp scheduled follow up with Dr. Rayann Heman.

## 2018-04-26 ENCOUNTER — Telehealth (INDEPENDENT_AMBULATORY_CARE_PROVIDER_SITE_OTHER): Payer: Self-pay | Admitting: Orthopaedic Surgery

## 2018-04-26 ENCOUNTER — Ambulatory Visit (HOSPITAL_COMMUNITY): Payer: Self-pay | Admitting: Psychiatry

## 2018-04-26 NOTE — Telephone Encounter (Signed)
Please advise 

## 2018-04-26 NOTE — Telephone Encounter (Signed)
Patient called to stated that they was waiting on call for lab results.  Please call patient to advise. [phone]

## 2018-04-26 NOTE — Telephone Encounter (Signed)
noted 

## 2018-04-26 NOTE — Telephone Encounter (Signed)
I call patient.  All lab work was normal.  Thumb is doing better.  FYI

## 2018-05-06 ENCOUNTER — Ambulatory Visit: Payer: Self-pay | Admitting: Internal Medicine

## 2018-05-06 ENCOUNTER — Other Ambulatory Visit: Payer: Self-pay | Admitting: Physician Assistant

## 2018-05-08 ENCOUNTER — Telehealth: Payer: Self-pay | Admitting: Gastroenterology

## 2018-05-08 NOTE — Telephone Encounter (Signed)
Spouse will call back once she speaks with the [facility] office. She will know when she can have the patient's procedure rescheduled.

## 2018-05-08 NOTE — Telephone Encounter (Signed)
Pt's wife called to r/s manometry that was scheduled for today but pt could not make it.

## 2018-05-13 ENCOUNTER — Other Ambulatory Visit: Payer: Self-pay | Admitting: Physician Assistant

## 2018-05-14 ENCOUNTER — Other Ambulatory Visit: Payer: Self-pay

## 2018-05-14 MED ORDER — NITROGLYCERIN 0.4 MG SL SUBL: 0.4000 mg | SUBLINGUAL_TABLET | SUBLINGUAL | 3 refills | Status: DC | PRN

## 2018-05-14 NOTE — Telephone Encounter (Signed)
refilled ntg to med assist

## 2018-05-16 ENCOUNTER — Ambulatory Visit: Payer: Self-pay | Admitting: Physician Assistant

## 2018-05-27 ENCOUNTER — Ambulatory Visit: Payer: Self-pay | Admitting: Physician Assistant

## 2018-05-31 ENCOUNTER — Other Ambulatory Visit (HOSPITAL_COMMUNITY)
Admission: RE | Admit: 2018-05-31 | Discharge: 2018-05-31 | Disposition: A | Discharge status: Home / Self Care | Payer: Self-pay | Source: Ambulatory Visit | Attending: Physician Assistant | Admitting: Physician Assistant

## 2018-05-31 ENCOUNTER — Other Ambulatory Visit (HOSPITAL_COMMUNITY)
Admission: RE | Admit: 2018-05-31 | Discharge: 2018-05-31 | Disposition: A | Discharge status: Home / Self Care | Payer: Self-pay | Source: Ambulatory Visit | Attending: Student | Admitting: Student

## 2018-05-31 DIAGNOSIS — R69 Illness, unspecified: Secondary | ICD-10-CM | POA: Insufficient documentation

## 2018-05-31 DIAGNOSIS — R0602 Shortness of breath: Secondary | ICD-10-CM | POA: Insufficient documentation

## 2018-05-31 LAB — COMPREHENSIVE METABOLIC PANEL
ALBUMIN: 4 g/dL (ref 3.5–5.0)
ALT: 20 U/L (ref 0–44)
AST: 16 U/L (ref 15–41)
Alkaline Phosphatase: 53 U/L (ref 38–126)
Anion gap: 7 (ref 5–15)
BILIRUBIN TOTAL: 0.5 mg/dL (ref 0.3–1.2)
BUN: 14 mg/dL (ref 6–20)
CO2: 26 mmol/L (ref 22–32)
Calcium: 9 mg/dL (ref 8.9–10.3)
Chloride: 104 mmol/L (ref 98–111)
Creatinine, Ser: 0.82 mg/dL (ref 0.61–1.24)
GFR calc Af Amer: >60 mL/min (ref >60)
GFR calc non Af Amer: >60 mL/min (ref >60)
GLUCOSE: 223 mg/dL — AB (ref 70–99)
POTASSIUM: 3.9 mmol/L (ref 3.5–5.1)
Sodium: 137 mmol/L (ref 135–145)
TOTAL PROTEIN: 7.1 g/dL (ref 6.5–8.1)

## 2018-05-31 LAB — LIPID PANEL
CHOL/HDL RATIO: 4.4 ratio
Cholesterol: 132 mg/dL (ref 0–200)
HDL: 30 mg/dL — AB (ref >40)
LDL CALC: 84 mg/dL (ref 0–99)
TRIGLYCERIDES: 90 mg/dL (ref <150)
VLDL: 18 mg/dL (ref 0–40)

## 2018-05-31 LAB — BASIC METABOLIC PANEL
Anion gap: 7 (ref 5–15)
BUN: 14 mg/dL (ref 6–20)
CO2: 26 mmol/L (ref 22–32)
CREATININE: 0.89 mg/dL (ref 0.61–1.24)
Calcium: 9.1 mg/dL (ref 8.9–10.3)
Chloride: 103 mmol/L (ref 98–111)
GFR calc non Af Amer: >60 mL/min (ref >60)
Glucose, Bld: 219 mg/dL — ABNORMAL HIGH (ref 70–99)
Potassium: 3.9 mmol/L (ref 3.5–5.1)
SODIUM: 136 mmol/L (ref 135–145)

## 2018-05-31 LAB — HEMOGLOBIN A1C
Hgb A1c MFr Bld: 8.3 % — ABNORMAL HIGH (ref 4.8–5.6)
Mean Plasma Glucose: 191.51 mg/dL

## 2018-05-31 LAB — PSA: Prostatic Specific Antigen: 0.4 ng/mL (ref 0.00–4.00)

## 2018-05-31 LAB — BRAIN NATRIURETIC PEPTIDE: B Natriuretic Peptide: 24 pg/mL (ref 0.0–100.0)

## 2018-06-01 LAB — MICROALBUMIN, URINE: Microalb, Ur: 10.8 ug/mL — ABNORMAL HIGH

## 2018-06-04 ENCOUNTER — Ambulatory Visit: Payer: Self-pay | Admitting: Physician Assistant

## 2018-06-05 ENCOUNTER — Telehealth: Payer: Self-pay | Admitting: Gastroenterology

## 2018-06-05 NOTE — Telephone Encounter (Signed)
Pt's wife called to see if patient could get Nexium samples and any coupons if available.

## 2018-06-05 NOTE — Telephone Encounter (Addendum)
I have left #20 samples of the Nexium OTC at front desk with coupons.  Richard [name] told pt I would leave samples at front.

## 2018-06-10 ENCOUNTER — Encounter: Payer: Self-pay | Admitting: Physician Assistant

## 2018-06-10 ENCOUNTER — Ambulatory Visit: Payer: Self-pay | Admitting: Physician Assistant

## 2018-06-10 VITALS — BP 115/70 | HR 58 | Temp 97.7°F | Ht 71.0 in | Wt 211.5 lb

## 2018-06-10 DIAGNOSIS — M545 Low back pain: Secondary | ICD-10-CM

## 2018-06-10 DIAGNOSIS — F17200 Nicotine dependence, unspecified, uncomplicated: Secondary | ICD-10-CM

## 2018-06-10 DIAGNOSIS — E785 Hyperlipidemia, unspecified: Secondary | ICD-10-CM

## 2018-06-10 DIAGNOSIS — IMO0002 Reserved for concepts with insufficient information to code with codable children: Secondary | ICD-10-CM

## 2018-06-10 DIAGNOSIS — G8929 Other chronic pain: Secondary | ICD-10-CM

## 2018-06-10 DIAGNOSIS — I10 Essential (primary) hypertension: Secondary | ICD-10-CM

## 2018-06-10 DIAGNOSIS — F39 Unspecified mood [affective] disorder: Secondary | ICD-10-CM

## 2018-06-10 DIAGNOSIS — J449 Chronic obstructive pulmonary disease, unspecified: Secondary | ICD-10-CM

## 2018-06-10 DIAGNOSIS — E118 Type 2 diabetes mellitus with unspecified complications: Principal | ICD-10-CM

## 2018-06-10 DIAGNOSIS — E1165 Type 2 diabetes mellitus with hyperglycemia: Secondary | ICD-10-CM

## 2018-06-10 DIAGNOSIS — I2510 Atherosclerotic heart disease of native coronary artery without angina pectoris: Secondary | ICD-10-CM

## 2018-06-10 NOTE — Progress Notes (Signed)
BP 115/70 (BP Location: Right Arm, Patient Position: Sitting, Cuff Size: Normal)   Pulse (!) 58   Temp 97.7 F (36.5 C)   Ht 5\' 11"  (1.803 m)   Wt 211 lb 8 oz (95.9 kg)   SpO2 96%   BMI 29.50 kg/m    Subjective:    Patient ID: Richard [name], male    DOB: December 30, 1965, 52 y.o.   MRN: [id number]  HPI: Richard [name] is a 52 y.o. male presenting on 06/10/2018 for Follow-up   HPI   Pt says he is avoiding the mt dew.  He is drinking pepsi sometimes.  He is still smoking.   Relevant past medical, surgical, family and social history reviewed and updated as indicated. Interim medical history since our last visit reviewed. Allergies and medications reviewed and updated.   Current Outpatient Medications:  .  acetaminophen (TYLENOL) 325 MG tablet, Take 650 mg by mouth every 6 (six) hours as needed for pain or fever. , Disp: , Rfl:  .  ALPRAZolam (XANAX) 0.5 MG tablet, Take one tablet by mouth three times a day and two tablets at night, Disp: 150 tablet, Rfl: 2 .  amLODipine (NORVASC) 5 MG tablet, Take 5 mg by mouth daily., Disp: , Rfl: 3 .  budesonide-formoterol (SYMBICORT) 80-4.5 MCG/ACT inhaler, Inhale 2 puffs into the lungs 2 (two) times daily as needed. , Disp: , Rfl:  .  calcium carbonate (TUMS - DOSED IN MG ELEMENTAL CALCIUM) 500 MG chewable tablet, Chew 1 tablet by mouth daily as needed for indigestion or heartburn., Disp: , Rfl:  .  cyclobenzaprine (FLEXERIL) 10 MG tablet, Take 1 tablet (10 mg total) by mouth 3 (three) times daily. (Patient taking differently: Take 10 mg by mouth 3 (three) times daily as needed for muscle spasms. ), Disp: 21 tablet, Rfl: 0 .  esomeprazole (NEXIUM) 20 MG capsule, Take 20 mg by mouth every morning. , Disp: , Rfl:  .  HYDROcodone-acetaminophen (NORCO/VICODIN) 5-325 MG tablet, Take one to two tablets every 4-6 hours as needed for pain, Disp: 40 tablet, Rfl: 0 .  losartan (COZAAR) 25 MG tablet, 03/01/18 HOLD per D DUNN PA-C, Disp: 90 tablet, Rfl: 3 .   metoprolol succinate (TOPROL-XL) 50 MG 24 hr tablet, TAKE 1 AND A HALF TABLETS BY MOUTH EVERY DAY, Disp: 45 tablet, Rfl: 9 .  nitroGLYCERIN (NITROSTAT) 0.4 MG SL tablet, Place 1 tablet (0.4 mg total) under the tongue every 5 (five) minutes x 3 doses as needed for chest pain (if no relief after 3rd dose, proceed to the ED for an evaluation)., Disp: 25 tablet, Rfl: 3 .  PROVENTIL HFA 108 (90 Base) MCG/ACT inhaler, INHALE 2 PUFFS BY MOUTH EVERY 6 HOURS AS NEEDED FOR WHEEZING OR SHORTNESS OF BREATH, Disp: 20.1 g, Rfl: 1 .  simvastatin (ZOCOR) 20 MG tablet, TAKE 1 TABLET BY MOUTH AT BEDTIME, Disp: 30 tablet, Rfl: 4 .  sitaGLIPtin (JANUVIA) 100 MG tablet, Take 1 tablet (100 mg total) by mouth daily., Disp: 90 tablet, Rfl: 1 .  XARELTO 20 MG TABS tablet, TAKE 1 Tablet BY MOUTH ONCE DAILY WITH SUPPER, Disp: 90 tablet, Rfl: 3   Review of Systems  Constitutional: Negative for appetite change, chills, diaphoresis, fatigue, fever and unexpected weight change.  HENT: Positive for congestion, dental problem and sneezing. Negative for drooling, ear pain, facial swelling, hearing loss, mouth sores, sore throat, trouble swallowing and voice change.   Eyes: Positive for itching. Negative for pain, discharge, redness and visual disturbance.  Respiratory: Positive for cough, shortness of breath and wheezing. Negative for choking.   Cardiovascular: Positive for palpitations. Negative for chest pain and leg swelling.  Gastrointestinal: Positive for constipation. Negative for abdominal pain, blood in stool, diarrhea and vomiting.  Endocrine: Negative for cold intolerance, heat intolerance and polydipsia.  Genitourinary: Positive for decreased urine volume. Negative for dysuria and hematuria.  Musculoskeletal: Positive for arthralgias, back pain and gait problem.  Skin: Negative for rash.  Allergic/Immunologic: Positive for environmental allergies.  Neurological: Positive for headaches. Negative for seizures, syncope  and light-headedness.  Hematological: Negative for adenopathy.  Psychiatric/Behavioral: Positive for agitation and dysphoric mood. Negative for suicidal ideas. The patient is not nervous/anxious.     Per HPI unless specifically indicated above     Objective:    BP 115/70 (BP Location: Right Arm, Patient Position: Sitting, Cuff Size: Normal)   Pulse (!) 58   Temp 97.7 F (36.5 C)   Ht 5\' 11"  (1.803 m)   Wt 211 lb 8 oz (95.9 kg)   SpO2 96%   BMI 29.50 kg/m   Wt Readings from Last 3 Encounters:  06/10/18 211 lb 8 oz (95.9 kg)  04/04/18 203 lb (92.1 kg)  03/12/18 205 lb (93 kg)    Physical Exam  Constitutional: He is oriented to person, place, and time. He appears well-developed and well-nourished.  HENT:  Head: Normocephalic and atraumatic.  Neck: Neck supple.  Cardiovascular: Normal rate and regular rhythm.  Pulmonary/Chest: Effort normal and breath sounds normal. He has no wheezes.  Abdominal: Soft. Bowel sounds are normal. There is no hepatosplenomegaly. There is no tenderness.  Musculoskeletal: He exhibits no edema.  Lymphadenopathy:    He has no cervical adenopathy.  Neurological: He is alert and oriented to person, place, and time.  Skin: Skin is warm and dry.  Psychiatric: He has a normal mood and affect. His behavior is normal.  Vitals reviewed.       Assessment & Plan:    Encounter Diagnoses  Name Primary?  [name] Uncontrolled diabetes mellitus with complications ([place]) Yes  . Essential hypertension   . Hyperlipidemia, unspecified hyperlipidemia type   . Tobacco use disorder   . Chronic obstructive pulmonary disease, unspecified COPD type ([place])   . Chronic low back pain without sciatica, unspecified back pain laterality   . Arteriosclerotic cardiovascular disease (ASCVD)   . Mood disorder (HCC)     -a1c 8.3.  Will Restart metformin and contiinue [place].  Counseled pt to avoid all sugary sodas. -counseled smoking cessation -updated Foot exam -pt to follow  up with cardiology as scheduled

## 2018-06-17 ENCOUNTER — Telehealth: Payer: Self-pay | Admitting: Gastroenterology

## 2018-06-17 ENCOUNTER — Ambulatory Visit: Payer: Self-pay | Admitting: Internal Medicine

## 2018-06-17 NOTE — Telephone Encounter (Signed)
Patient is needing Nexium samples. [phone]

## 2018-06-17 NOTE — Telephone Encounter (Signed)
Nexium OTC #20 at front for pick up and [name] is aware.

## 2018-06-25 ENCOUNTER — Telehealth: Payer: Self-pay

## 2018-06-25 NOTE — Telephone Encounter (Signed)
Christy left Vm requesting samples of Nexium OTC for pt. I have left #10 boxes (#20) tablets at front. Left vm to pick up.

## 2018-07-04 ENCOUNTER — Telehealth: Payer: Self-pay | Admitting: Gastroenterology

## 2018-07-04 NOTE — Telephone Encounter (Signed)
FYI to Anna Boone, NP.  

## 2018-07-04 NOTE — Telephone Encounter (Signed)
Pt is aware I have 14 boxes of 2 each of Nexium OTC at front for pick up.

## 2018-07-04 NOTE — Telephone Encounter (Signed)
Pt's wife, [name], called to see if she could get more Nexium samples for [place]. Please advise [phone]

## 2018-07-08 ENCOUNTER — Encounter (HOSPITAL_COMMUNITY): Admission: RE | Payer: Self-pay | Source: Ambulatory Visit

## 2018-07-08 ENCOUNTER — Ambulatory Visit (HOSPITAL_COMMUNITY): Admission: RE | Admit: 2018-07-08 | Payer: Self-pay | Source: Ambulatory Visit | Admitting: Gastroenterology

## 2018-07-08 SURGERY — MANOMETRY, ESOPHAGUS

## 2018-07-15 ENCOUNTER — Ambulatory Visit (HOSPITAL_COMMUNITY): Payer: Self-pay | Admitting: Psychiatry

## 2018-07-18 ENCOUNTER — Ambulatory Visit (INDEPENDENT_AMBULATORY_CARE_PROVIDER_SITE_OTHER): Payer: Self-pay | Admitting: Orthopaedic Surgery

## 2018-07-22 ENCOUNTER — Telehealth: Payer: Self-pay | Admitting: Cardiovascular Disease

## 2018-07-22 NOTE — Telephone Encounter (Signed)
Please give pt a call -- he's been having problems w/ his BP

## 2018-07-23 MED ORDER — LISINOPRIL 10 MG PO TABS: 10.0000 mg | ORAL_TABLET | Freq: Every day | ORAL | 3 refills | Status: DC

## 2018-07-23 NOTE — Telephone Encounter (Signed)
Switch losartan to lisinopril 10 mg daily.

## 2018-07-23 NOTE — Telephone Encounter (Signed)
Called pt. No answer, left message for pt to return call. Dc'd losartan, sent lisinopril 10 mg daily to pharmacy.

## 2018-07-23 NOTE — Telephone Encounter (Signed)
Returned pt call. No answer, left message for pt to return call.  

## 2018-07-23 NOTE — Telephone Encounter (Signed)
Pt returned call. He states that his blood pressure has been running a little higher over the past month- (140's / 90's) He is still taking losartan 25 mg daily , metoprolol 75 mg daily but states he was taken off his amlodipine over summer visit with Melina Copa, PA. Even though it is on his medication list. He also states that since starting the Losartan he has had these "brain zaps" and pains in his brain that comes and goes daily. Please advise.

## 2018-07-24 NOTE — Telephone Encounter (Signed)
Called pt. No answer, left message for pt to return call.  

## 2018-07-24 NOTE — Telephone Encounter (Signed)
Pt returned call. Informed pt on the medication change. Sent RX to [facility]. He will keep a blood pressure log and let us know if the medication does not agree with him.

## 2018-07-28 ENCOUNTER — Telehealth: Payer: Self-pay

## 2018-07-28 NOTE — Telephone Encounter (Signed)
Patient meets inclusion criteria for current pharmacy residency project to initiate SGLT2i or GLP1-RA therapy for cardiovascular benefit due to current diagnosis of ASCVD and DM.  Patient has scheduled appointment with Dr. Rayann Heman on 1/17.  Pharmacist will discuss initiation of Jardiance or Victoza and enrollment into study with patient and provider at upcoming office visit.   Relevant labs and dates: Lab Results  Component Value Date   HGBA1C 8.3 (H) 05/31/2018   Lab Results  Component Value Date   CREATININE 0.82 05/31/2018   CREATININE 0.89 05/31/2018   CREATININE 0.99 02/13/2018   CrCl cannot be calculated (Patient's most recent lab result is older than the maximum 21 days allowed.). Wt Readings from Last 1 Encounters:  06/10/18 211 lb 8 oz (95.9 kg)   BP Readings from Last 1 Encounters:  06/10/18 115/70    Metformin use: Tor Netters D PGY1 Pharmacy Resident   07/28/2018      1:26 PM

## 2018-07-31 ENCOUNTER — Telehealth: Payer: Self-pay | Admitting: Gastroenterology

## 2018-07-31 NOTE — Telephone Encounter (Signed)
Richard [name] is aware I am leaving samples at front #24 to pick up.

## 2018-07-31 NOTE — Telephone Encounter (Signed)
[phone] PATIENT CALLED TO SEE IF WE HAVE ANY NEXIUM SAMPLES

## 2018-08-02 ENCOUNTER — Ambulatory Visit: Payer: Self-pay | Admitting: Internal Medicine

## 2018-08-02 NOTE — Telephone Encounter (Signed)
Pt cancelled appt and has not rescheduled at this time.

## 2018-08-08 ENCOUNTER — Encounter (INDEPENDENT_AMBULATORY_CARE_PROVIDER_SITE_OTHER): Payer: Self-pay | Admitting: Orthopaedic Surgery

## 2018-08-08 ENCOUNTER — Ambulatory Visit (INDEPENDENT_AMBULATORY_CARE_PROVIDER_SITE_OTHER): Payer: Self-pay | Admitting: Orthopaedic Surgery

## 2018-08-08 ENCOUNTER — Other Ambulatory Visit: Payer: Self-pay | Admitting: Physician Assistant

## 2018-08-08 VITALS — BP 121/75 | HR 60 | Ht 71.0 in | Wt 211.0 lb

## 2018-08-08 DIAGNOSIS — M7711 Lateral epicondylitis, right elbow: Secondary | ICD-10-CM

## 2018-08-08 NOTE — Progress Notes (Signed)
Office Visit Note   Patient: Richard [name]           Date of Birth: 1966/04/08           MRN: [id number] Visit Date: 08/08/2018              Requested by: [name], PA-C [address] [place], [place] [address] PCP: [name], PA-C   Assessment & Plan: Visit Diagnoses:  1. Lateral epicondylitis, right elbow     Plan: Patient needs to get his diabetes under control once his A1c is below 7.5 he could proceed with lateral epicondylar release and repair.  We discussed the importance of diabetic control and associated complications that occur with uncontrolled diabetes, renal, peripheral neuropathy, retina etc.  He will work on this and call once he is under good control then we can schedule him for outpatient lateral epicondylar release.  Follow-Up Instructions: Return if symptoms worsen or fail to improve.   Orders:  No orders of the defined types were placed in this encounter.  No orders of the defined types were placed in this encounter.     Procedures: No procedures performed   Clinical Data: No additional findings.   Subjective: Chief Complaint  Patient presents with  . Right Elbow - Pain    HPI 53 year old male returns with persistent right lateral epicondylar pain.  He was injected in July 2019 which helped for several months.  Now he has increased pain with gripping squeezing.  He did use the brace and states the brace is now worn out the pain wakes him up at night he is used ice heat, Aspercreme, Tylenol, hydrocodone, Flexeril.  He is a diabetic and is not on insulin.  He has a wife who is on insulin and they have had trouble with insurance coverage to the deductibles and cost her insulin.  He has had problems with panic attacks has not been able to work, gets a job is not been able to keep it.  He has anxiety with driving.  Patient diabetes is type II currently on oral medications.  His A1c has been elevated 8.3-10.    Review of Systems updated and  unchanged from 12/27/2017.  Positive for atrial fib on Xarelto.  Oral medications for his diabetes.   Objective: Vital Signs: BP 121/75   Pulse 60   Ht 5\' 11"  (1.803 m)   Wt 211 lb (95.7 kg)   BMI 29.43 kg/m   Physical Exam Constitutional:      Appearance: He is well-developed.  HENT:     Head: Normocephalic and atraumatic.  Eyes:     Pupils: Pupils are equal, round, and reactive to light.  Neck:     Thyroid: No thyromegaly.     Trachea: No tracheal deviation.  Cardiovascular:     Rate and Rhythm: Normal rate.  Pulmonary:     Effort: Pulmonary effort is normal.     Breath sounds: No wheezing.  Abdominal:     General: Bowel sounds are normal.     Palpations: Abdomen is soft.  Skin:    General: Skin is warm and dry.     Capillary Refill: Capillary refill takes less than 2 seconds.  Neurological:     Mental Status: He is alert and oriented to person, place, and time.  Psychiatric:        Behavior: Behavior normal.        Thought Content: Thought content normal.  Judgment: Judgment normal.     Ortho Exam patient has patient with the wrist extension pain with lateral epicondylar palpation.  Good cervical range of motion no brachial plexus tenderness.  Normal heel toe gait.  No venous stasis changes.  Some small areas of erythema with spots that he says sometimes of had a little bit of purulence in them for short period of time. Specialty Comments:  No specialty comments available.  Imaging: No results found.   PMFS History: Patient Active Problem List   Diagnosis Date Noted  . Globus sensation 02/21/2018  . Other cervical disc degeneration, unspecified cervical region 01/11/2018  . Tendinitis of right triceps 01/11/2018  . Neck pain 12/27/2017  . Lateral epicondylitis, right elbow 05/10/2017  . Constipation 12/24/2015  . Dyspnea 11/17/2015  . Coronary artery disease due to lipid rich plaque   . Heart palpitations 08/12/2015  . TIA (transient ischemic  attack) 08/12/2015  . Colon cancer screening 08/02/2015  . Abdominal pain 12/18/2014  . Encounter for screening colonoscopy 12/18/2014  . Unspecified vitamin D deficiency 08/20/2012  . Arteriosclerotic cardiovascular disease (ASCVD) 04/11/2012  . Chronic low back pain   . Essential hypertension   . Anxiety and depression   . DIABETES MELLITUS-TYPE II 10/03/2010  . Cigarette smoker 09/14/2010  . CHRONIC OBSTRUCTIVE PULMONARY DISEASE 09/14/2010  . HLD (hyperlipidemia) 11/25/2009  . GASTROESOPHAGEAL REFLUX DISEASE 04/05/2009  . Hepatic steatosis 04/05/2009   Past Medical History:  Diagnosis Date  . Arthritis    "legs, spine, shoulders" (08/12/2015)  . Asthma   . Colitis 1990  . COPD (chronic obstructive pulmonary disease) ([place])   . Depression   . Gastric ulcer 2003; 2012   2003: + esophagitis; negative H.pylori serology  2012: Dr. Oneida Alar, mild gastritis, Bravo PH probe placement, negative H.pylori  . GERD (gastroesophageal reflux disease)   . Hepatic steatosis   . History of hiatal hernia   . Hyperlipemia   . Hypertension   . Mild CAD    a. Cardiac cath 07/2015 showed 65% distal Cx, 20% mid-distal LAD, 20% prox-distal RCA, EF 60%, EDP 46mmHg.  [name] Overweight   . Panic attacks   . Paroxysmal atrial fibrillation (HCC)   . Stroke [facility])    mucsle in left side of face dosent work as its supposed to-only deficit.  [name] TIA (transient ischemic attack)    after cath 1/ 17  . Tobacco abuse    1/2 pack per day  . Type II diabetes mellitus (HCC)     Family History  Problem Relation Age of Onset  . Lung cancer Mother   . Alcohol abuse Mother   . Heart attack Father 74  . Diabetes Father   . Alcohol abuse Father   . Hypertension Brother   . Hypertension Brother   . Anxiety disorder Sister   . Depression Sister   . Anxiety disorder Sister   . Heart attack Brother 52  . Diabetes Brother   . Hypertension Brother   . Seizures Brother   . Dementia Paternal Uncle   . Dementia Cousin     . ADD / ADHD Daughter   . Colon cancer Neg Hx   . Drug abuse Neg Hx   . Bipolar disorder Neg Hx   . OCD Neg Hx   . Paranoid behavior Neg Hx   . Schizophrenia Neg Hx   . Sexual abuse Neg Hx   . Physical abuse Neg Hx     Past Surgical History:  Procedure Laterality Date  .  BRAVO [place] STUDY  05/03/2011   NUU:VOZD gastritis/normal esophagus and duodenum  . CARDIAC CATHETERIZATION  1990s X 1; 2005; 08/12/2015  . CARDIAC CATHETERIZATION N/A 08/12/2015   Procedure: Left Heart Cath and Coronary Angiography;  Surgeon: Belva Crome, MD; LAD 20%, CFX 65%, RCA 20%, EF 60%   . COLONOSCOPY  1990  . COLONOSCOPY WITH PROPOFOL N/A 11/21/2016   Dr. Oneida Alar: non-thrombosed external hemorrhoids, one 6 mm polyp (polypoid lesion), internal hemorrhoids. TI Normal. 10 years screening  . ESOPHAGOGASTRODUODENOSCOPY  05/03/2011   GUY:QIHK gastritis  . NECK MASS EXCISION Right    "done in dr's office; behind right ear/side of ncek"  . POLYPECTOMY  11/21/2016   Procedure: POLYPECTOMY;  Surgeon: [name], MD;  [facility];  Service: Endoscopy;;  descending colon polyp  . SHOULDER ARTHROSCOPY W/ ROTATOR CUFF REPAIR Right 2006   acromioclavicular joint arthrosis   Social History   Occupational History  . Occupation: full time    Employer: UNEMPLOYED  Tobacco Use  . Smoking status: Current Every Day Smoker    Packs/day: 0.25    Years: 25.00    Pack years: 6.25    Types: Cigarettes    Start date: 07/17/1982  . Smokeless tobacco: Never Used  Substance and Sexual Activity  . Alcohol use: No    Alcohol/week: 0.0 standard drinks  . Drug use: No  . Sexual activity: Yes    Birth control/protection: None

## 2018-08-12 ENCOUNTER — Ambulatory Visit (HOSPITAL_COMMUNITY): Payer: Self-pay | Admitting: Psychiatry

## 2018-08-14 ENCOUNTER — Telehealth: Payer: Self-pay | Admitting: Gastroenterology

## 2018-08-14 NOTE — Telephone Encounter (Signed)
Pt's wife, [name], was asking if we had anymore Nexium samples for patient. Please call [phone]

## 2018-08-14 NOTE — Telephone Encounter (Signed)
LMOM that I have samples at the front for pt #12 boxes of Nexium OTC.

## 2018-08-16 ENCOUNTER — Encounter (HOSPITAL_COMMUNITY): Payer: Self-pay | Admitting: Psychiatry

## 2018-08-16 ENCOUNTER — Ambulatory Visit (INDEPENDENT_AMBULATORY_CARE_PROVIDER_SITE_OTHER): Payer: Self-pay | Admitting: Psychiatry

## 2018-08-16 VITALS — BP 130/79 | HR 64 | Ht 71.0 in | Wt 206.0 lb

## 2018-08-16 DIAGNOSIS — F4001 Agoraphobia with panic disorder: Secondary | ICD-10-CM

## 2018-08-16 DIAGNOSIS — F329 Major depressive disorder, single episode, unspecified: Secondary | ICD-10-CM

## 2018-08-16 DIAGNOSIS — F419 Anxiety disorder, unspecified: Secondary | ICD-10-CM

## 2018-08-16 MED ORDER — FLUOXETINE HCL 20 MG PO CAPS: 20.0000 mg | ORAL_CAPSULE | Freq: Every day | ORAL | 2 refills | Status: DC

## 2018-08-16 MED ORDER — ALPRAZOLAM 0.5 MG PO TABS: ORAL_TABLET | ORAL | 2 refills | Status: DC

## 2018-08-16 NOTE — Progress Notes (Signed)
BH MD/PA/NP OP Progress Note  08/16/2018 10:59 AM Richard [name]  MRN:  [id number]  Chief Complaint: anxiety, depression HPI:  Thispatient is a 53 year old Caucasian male who came for his followup appointment. He is currently living with his wife in [place]. He is unemployed and applying for disability.  The patient states that he has been employed for 5 years. 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 He got injured on the job and both shoulders were torn. He has not been able to work ever since and he feels very badly about this. 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  Since he's not been able to work the patient has been having increasingly depressed and anxious. He feels like his body gives out when he is trying to exert himself even in a minor way.  The patient returns after 3 months.  He is still frustrated with his health issues and inability to work and do things around his home and yard.  He states this makes him depressed.  I tried to prescribe Zoloft and he claims he could not afford it we looked at various antidepressants and the most affordable one is Prozac and he states he will agree to get this at [facility] because it only costs $4 there.  He denies suicidal ideation but does have a high level of frustration.  Xanax continues to help his anxiety.  He is applied for disability numerous times and keeps getting turned down and he is finally given up. Visit Diagnosis:    ICD-10-CM   1. 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 Anxiety and depression F41.9    F32.9     Past Psychiatric History: none  Past Medical History:  Past Medical History:  Diagnosis Date  . Arthritis    "legs, spine, shoulders" (08/12/2015)  . Asthma   . Colitis 1990  . COPD (chronic obstructive pulmonary disease) ([place])   . Depression   . Gastric ulcer 2003; 2012   2003: + esophagitis; negative  H.pylori serology  2012: Dr. Oneida Alar, mild gastritis, Bravo PH probe placement, negative H.pylori  . GERD (gastroesophageal reflux disease)   . Hepatic steatosis   . History of hiatal hernia   . Hyperlipemia   . Hypertension   . Mild CAD    a. Cardiac cath 07/2015 showed 65% distal Cx, 20% mid-distal LAD, 20% prox-distal RCA, EF 60%, EDP 63mmHg.  [name] Overweight   . Panic attacks   . Paroxysmal atrial fibrillation (HCC)   . Stroke [facility])    mucsle in left side of face dosent work as its supposed to-only deficit.  [name] TIA (transient ischemic attack)    after cath 1/ 17  . Tobacco abuse    1/2 pack per day  . Type II diabetes mellitus ([place])     Past Surgical History:  Procedure Laterality Date  . BRAVO [place] STUDY  05/03/2011   VEL:FYBO gastritis/normal esophagus and duodenum  . CARDIAC CATHETERIZATION  1990s X 1; 2005; 08/12/2015  . CARDIAC CATHETERIZATION N/A 08/12/2015   Procedure: Left Heart Cath and Coronary Angiography;  Surgeon: Belva Crome, MD; LAD 20%, CFX 65%, RCA 20%, EF 60%   . COLONOSCOPY  1990  . COLONOSCOPY WITH PROPOFOL N/A 11/21/2016   Dr. Oneida Alar: non-thrombosed external hemorrhoids, one 6 mm polyp (polypoid lesion), internal hemorrhoids. TI Normal. 10 years screening  . ESOPHAGOGASTRODUODENOSCOPY  05/03/2011   ZSW:FUXN gastritis  . NECK MASS EXCISION Right    "done in dr's office; behind right ear/side of ncek"  . POLYPECTOMY  11/21/2016   Procedure: POLYPECTOMY;  Surgeon: [name], MD;  [facility];  Service: Endoscopy;;  descending colon polyp  . SHOULDER ARTHROSCOPY W/ ROTATOR CUFF REPAIR Right 2006   acromioclavicular joint arthrosis    Family Psychiatric History: See below  Family History:  Family History  Problem Relation Age of Onset  . Lung cancer Mother   . Alcohol abuse Mother   . Heart attack Father 91  . Diabetes Father   . Alcohol abuse Father   . Hypertension Brother   . Hypertension Brother   . Anxiety disorder Sister   .  Depression Sister   . Anxiety disorder Sister   . Heart attack Brother 36  . Diabetes Brother   . Hypertension Brother   . Seizures Brother   . Dementia Paternal Uncle   . Dementia Cousin   . ADD / ADHD Daughter   . Colon cancer Neg Hx   . Drug abuse Neg Hx   . Bipolar disorder Neg Hx   . OCD Neg Hx   . Paranoid behavior Neg Hx   . Schizophrenia Neg Hx   . Sexual abuse Neg Hx   . Physical abuse Neg Hx     Social History:  Social History   Socioeconomic History  . Marital status: Married    Spouse name: Not on file  . Number of children: Not on file  . Years of education: Not on file  . Highest education level: Not on file  Occupational History  . Occupation: full time    Employer: UNEMPLOYED  Social Needs  . Financial resource strain: Not on file  . Food insecurity:    Worry: Not on file    Inability: Not on file  . Transportation needs:    Medical: Not on file    Non-medical: Not on file  Tobacco Use  . Smoking status: Current Every Day Smoker    Packs/day: 0.25    Years: 25.00    Pack years: 6.25    Types: Cigarettes    Start date: 07/17/1982  . Smokeless tobacco: Never Used  Substance and Sexual Activity  . Alcohol use: No    Alcohol/week: 0.0 standard drinks  . Drug use: No  . Sexual activity: Yes    Birth control/protection: None  Lifestyle  . Physical activity:    Days per week: Not on file    Minutes per session: Not on file  . Stress: Not on file  Relationships  . Social connections:    Talks on phone: Not on file    Gets together: Not on file    Attends religious service: Not on file    Active member of club or organization: Not on file    Attends meetings of clubs or organizations: Not on file    Relationship status: Not on file  Other Topics Concern  . Not on file  Social History Narrative   Pt lives in [place] with wife.  5 children.  Unemployed due to panic attacks and back pain    Allergies:  Allergies  Allergen Reactions  .  Dexilant [Dexlansoprazole] Anaphylaxis  . Mushroom Ext Cmplx(Shiitake-Reishi-Mait) Anaphylaxis    Rapid heart rate.  . Penicillins Anaphylaxis    Has patient had a PCN reaction causing immediate rash, facial/tongue/throat swelling, SOB or lightheadedness with hypotension: Yes  Has patient had a PCN reaction causing severe rash involving mucus membranes or skin necrosis: No Has patient had a PCN reaction that required hospitalization Yes Has patient had a PCN reaction occurring within the last 10 years: No If all of the above answers are "NO", then may proceed with Cephalosporin use.   [name] Doxycycline     Metabolic Disorder Labs: Lab Results  Component Value Date   HGBA1C 8.3 (H) 05/31/2018   MPG 191.51 05/31/2018   MPG 214.47 01/24/2018   No results found for: PROLACTIN Lab Results  Component Value Date   CHOL 132 05/31/2018   TRIG 90 05/31/2018   HDL 30 (L) 05/31/2018   CHOLHDL 4.4 05/31/2018   VLDL 18 05/31/2018   LDLCALC 84 05/31/2018   LDLCALC 74 01/24/2018   Lab Results  Component Value Date   TSH 0.958 04/21/2011   TSH 1.402 05/27/2009    Therapeutic Level Labs: No results found for: LITHIUM No results found for: VALPROATE No components found for:  CBMZ  Current Medications: Current Outpatient Medications  Medication Sig Dispense Refill  . acetaminophen (TYLENOL) 325 MG tablet Take 650 mg by mouth every 6 (six) hours as needed for pain or fever.     . ALPRAZolam (XANAX) 0.5 MG tablet Take one tablet by mouth three times a day and two tablets at night 150 tablet 2  . amLODipine (NORVASC) 5 MG tablet Take 5 mg by mouth daily.  3  . budesonide-formoterol (SYMBICORT) 80-4.5 MCG/ACT inhaler Inhale 2 puffs into the lungs 2 (two) times daily as needed.     . calcium carbonate (TUMS - DOSED IN MG ELEMENTAL CALCIUM) 500 MG chewable tablet Chew 1 tablet by mouth daily as needed for indigestion or heartburn.    . cyclobenzaprine (FLEXERIL) 10 MG tablet Take 1 tablet (10 mg  total) by mouth 3 (three) times daily. (Patient taking differently: Take 10 mg by mouth 3 (three) times daily as needed for muscle spasms. ) 21 tablet 0  . esomeprazole (NEXIUM) 20 MG capsule Take 20 mg by mouth every morning.     [name] HYDROcodone-acetaminophen (NORCO/VICODIN) 5-325 MG tablet Take one to two tablets every 4-6 hours as needed for pain 40 tablet 0  . JANUVIA 100 MG tablet TAKE 1 Tablet BY MOUTH ONCE DAILY 90 tablet 1  . lisinopril (PRINIVIL,ZESTRIL) 10 MG tablet Take 1 tablet (10 mg total) by mouth daily. 90 tablet 3  . metFORMIN (GLUCOPHAGE) 500 MG tablet Take 1 tablet (500 mg total) by mouth 2 (two) times daily with a meal. 1 tablet 0  . metoprolol succinate (TOPROL-XL) 50 MG 24 hr tablet TAKE 1 AND A HALF TABLETS BY MOUTH EVERY DAY 45 tablet 9  . nitroGLYCERIN (NITROSTAT) 0.4 MG SL tablet Place 1 tablet (0.4 mg total) under the tongue every 5 (five) minutes x 3 doses as needed for chest pain (if no relief after 3rd dose, proceed to the ED for an evaluation). 25 tablet 3  . PROVENTIL HFA 108 (90 Base) MCG/ACT inhaler INHALE 2 PUFFS BY MOUTH EVERY 6 HOURS AS NEEDED FOR WHEEZING OR SHORTNESS OF BREATH 20.1 g 1  . simvastatin (ZOCOR) 20 MG tablet TAKE 1 TABLET BY MOUTH AT BEDTIME 30 tablet 4  . XARELTO 20 MG TABS tablet TAKE 1 Tablet BY MOUTH ONCE DAILY WITH SUPPER 90 tablet 3  . FLUoxetine (PROZAC) 20 MG capsule Take 1 capsule (20 mg total) by mouth daily. 30 capsule 2   No current facility-administered medications for this  visit.      Musculoskeletal: Strength & Muscle Tone: within normal limits Gait & Station: normal Patient leans: N/A  Psychiatric Specialty Exam: Review of Systems  Cardiovascular: Positive for palpitations.  Musculoskeletal: Positive for back pain.  Psychiatric/Behavioral: Positive for depression. The patient is nervous/anxious.   All other systems reviewed and are negative.   Blood pressure 130/79, pulse 64, height 5\' 11"  (1.803 m), weight 206 lb (93.4  kg), SpO2 100 %.Body mass index is 28.73 kg/m.  General Appearance: Casual and Fairly Groomed  Eye Contact:  Good  Speech:  Clear and Coherent  Volume:  Normal  Mood:  Anxious  Affect:  Constricted  Thought Process:  Goal Directed  Orientation:  Full (Time, Place, and Person)  Thought Content: Rumination   Suicidal Thoughts:  No  Homicidal Thoughts:  No  Memory:  Immediate;   Good Recent;   Good Remote;   Good  Judgement:  Fair  Insight:  Fair  Psychomotor Activity:  Decreased  Concentration:  Concentration: Good and Attention Span: Good  Recall:  Good  Fund of Knowledge: Fair  Language: good  Akathisia:  No  Handed:  Right  AIMS (if indicated): not done  Assets:  Communication Skills Desire for Improvement Resilience Social Support Talents/Skills  ADL's:  Intact  Cognition: WNL  Sleep:  Fair   Screenings: PHQ2-9     Patient Outreach Telephone from 08/30/2015 in [facility] Patient Outreach Telephone from 08/26/2015 in [facility]  PHQ-2 Total Score  2  4  PHQ-9 Total Score  8  8       Assessment and Plan:  This patient is a 53 year old male with a history of numerous chronic medical conditions, chronic pain depression and anxiety.  He will continue Xanax 0.5 mg 3 times a day and 1 mg at bedtime for anxiety.  He will start Prozac 20 mg daily for depression.  He will return to see me in 3 months or call sooner if needed  [name], MD 08/16/2018, 10:59 AM

## 2018-08-27 ENCOUNTER — Telehealth: Payer: Self-pay | Admitting: Gastroenterology

## 2018-08-27 NOTE — Telephone Encounter (Signed)
#  10 boxes of Nexium OTC at front desk for pt. Richard [name] said [name] was aware we had samples and I would leave some for pt.

## 2018-08-27 NOTE — Telephone Encounter (Signed)
Pt's wife called to see if patient could get more samples of Nexium. DS is aware

## 2018-09-09 ENCOUNTER — Other Ambulatory Visit (HOSPITAL_COMMUNITY)
Admission: RE | Admit: 2018-09-09 | Discharge: 2018-09-09 | Disposition: A | Discharge status: Home / Self Care | Payer: Self-pay | Source: Ambulatory Visit | Attending: Physician Assistant | Admitting: Physician Assistant

## 2018-09-09 DIAGNOSIS — E785 Hyperlipidemia, unspecified: Secondary | ICD-10-CM

## 2018-09-09 DIAGNOSIS — E118 Type 2 diabetes mellitus with unspecified complications: Secondary | ICD-10-CM | POA: Insufficient documentation

## 2018-09-09 DIAGNOSIS — IMO0002 Reserved for concepts with insufficient information to code with codable children: Secondary | ICD-10-CM

## 2018-09-09 DIAGNOSIS — E1165 Type 2 diabetes mellitus with hyperglycemia: Secondary | ICD-10-CM | POA: Insufficient documentation

## 2018-09-09 DIAGNOSIS — I10 Essential (primary) hypertension: Secondary | ICD-10-CM

## 2018-09-09 LAB — COMPREHENSIVE METABOLIC PANEL
ALT: 20 U/L (ref 0–44)
AST: 14 U/L — ABNORMAL LOW (ref 15–41)
Albumin: 4.1 g/dL (ref 3.5–5.0)
Alkaline Phosphatase: 50 U/L (ref 38–126)
Anion gap: 8 (ref 5–15)
BILIRUBIN TOTAL: 0.3 mg/dL (ref 0.3–1.2)
BUN: 14 mg/dL (ref 6–20)
CO2: 26 mmol/L (ref 22–32)
Calcium: 9.1 mg/dL (ref 8.9–10.3)
Chloride: 103 mmol/L (ref 98–111)
Creatinine, Ser: 0.9 mg/dL (ref 0.61–1.24)
GFR calc Af Amer: >60 mL/min (ref >60)
GFR calc non Af Amer: >60 mL/min (ref >60)
GLUCOSE: 166 mg/dL — AB (ref 70–99)
POTASSIUM: 3.6 mmol/L (ref 3.5–5.1)
Sodium: 137 mmol/L (ref 135–145)
TOTAL PROTEIN: 7.3 g/dL (ref 6.5–8.1)

## 2018-09-09 LAB — HEMOGLOBIN A1C
HEMOGLOBIN A1C: 8.3 % — AB (ref 4.8–5.6)
Mean Plasma Glucose: 191.51 mg/dL

## 2018-09-09 LAB — LIPID PANEL
Cholesterol: 140 mg/dL (ref 0–200)
HDL: 30 mg/dL — ABNORMAL LOW (ref >40)
LDL CALC: 84 mg/dL (ref 0–99)
Total CHOL/HDL Ratio: 4.7 RATIO
Triglycerides: 130 mg/dL (ref <150)
VLDL: 26 mg/dL (ref 0–40)

## 2018-09-10 ENCOUNTER — Ambulatory Visit: Payer: Self-pay | Admitting: Physician Assistant

## 2018-09-10 ENCOUNTER — Encounter: Payer: Self-pay | Admitting: Physician Assistant

## 2018-09-10 VITALS — BP 106/70 | HR 59 | Temp 97.7°F | Ht 71.0 in | Wt 206.0 lb

## 2018-09-10 DIAGNOSIS — E785 Hyperlipidemia, unspecified: Secondary | ICD-10-CM

## 2018-09-10 DIAGNOSIS — L600 Ingrowing nail: Secondary | ICD-10-CM

## 2018-09-10 DIAGNOSIS — F17200 Nicotine dependence, unspecified, uncomplicated: Secondary | ICD-10-CM

## 2018-09-10 DIAGNOSIS — I10 Essential (primary) hypertension: Secondary | ICD-10-CM

## 2018-09-10 DIAGNOSIS — F39 Unspecified mood [affective] disorder: Secondary | ICD-10-CM

## 2018-09-10 DIAGNOSIS — E118 Type 2 diabetes mellitus with unspecified complications: Principal | ICD-10-CM

## 2018-09-10 DIAGNOSIS — IMO0002 Reserved for concepts with insufficient information to code with codable children: Secondary | ICD-10-CM

## 2018-09-10 DIAGNOSIS — E1165 Type 2 diabetes mellitus with hyperglycemia: Secondary | ICD-10-CM

## 2018-09-10 DIAGNOSIS — Z7901 Long term (current) use of anticoagulants: Secondary | ICD-10-CM

## 2018-09-10 DIAGNOSIS — J449 Chronic obstructive pulmonary disease, unspecified: Secondary | ICD-10-CM

## 2018-09-10 NOTE — Progress Notes (Signed)
BP 106/70 (BP Location: Left Arm, Patient Position: Sitting, Cuff Size: Normal)   Pulse (!) 59   Temp 97.7 F (36.5 C) (Other (Comment))   Ht 5\' 11"  (1.803 m)   Wt 206 lb (93.4 kg)   SpO2 99%   BMI 28.73 kg/m    Subjective:    Patient ID: Richard [name], male    DOB: January 04, 1966, 53 y.o.   MRN: [id number]  HPI: Richard [name] is a 53 y.o. male presenting on 09/10/2018 for Diabetes   HPI  Pt is doing well.  He says the orthopedist is planning to operate on his arm but won't do so until his a1c is in the 7s.  Nevertheless, pt continues to only take his metformin once daily on some days.  Pt is still smoking.  Pt has no new complaints today.  Relevant past medical, surgical, family and social history reviewed and updated as indicated. Interim medical history since our last visit reviewed. Allergies and medications reviewed and updated.   Current Outpatient Medications:  .  acetaminophen (TYLENOL) 325 MG tablet, Take 650 mg by mouth every 6 (six) hours as needed for pain or fever. , Disp: , Rfl:  .  ALPRAZolam (XANAX) 0.5 MG tablet, Take one tablet by mouth three times a day and two tablets at night, Disp: 150 tablet, Rfl: 2 .  amLODipine (NORVASC) 5 MG tablet, Take 5 mg by mouth daily., Disp: , Rfl: 3 .  budesonide-formoterol (SYMBICORT) 80-4.5 MCG/ACT inhaler, Inhale 2 puffs into the lungs 2 (two) times daily as needed. , Disp: , Rfl:  .  calcium carbonate (TUMS - DOSED IN MG ELEMENTAL CALCIUM) 500 MG chewable tablet, Chew 1 tablet by mouth daily as needed for indigestion or heartburn., Disp: , Rfl:  .  cyclobenzaprine (FLEXERIL) 10 MG tablet, Take 1 tablet (10 mg total) by mouth 3 (three) times daily. (Patient taking differently: Take 10 mg by mouth 3 (three) times daily as needed for muscle spasms. ), Disp: 21 tablet, Rfl: 0 .  esomeprazole (NEXIUM) 20 MG capsule, Take 20 mg by mouth every morning. , Disp: , Rfl:  .  HYDROcodone-acetaminophen (NORCO/VICODIN) 5-325 MG tablet,  Take one to two tablets every 4-6 hours as needed for pain, Disp: 40 tablet, Rfl: 0 .  JANUVIA 100 MG tablet, TAKE 1 Tablet BY MOUTH ONCE DAILY, Disp: 90 tablet, Rfl: 1 .  lisinopril (PRINIVIL,ZESTRIL) 10 MG tablet, Take 1 tablet (10 mg total) by mouth daily., Disp: 90 tablet, Rfl: 3 .  metoprolol succinate (TOPROL-XL) 50 MG 24 hr tablet, TAKE 1 AND A HALF TABLETS BY MOUTH EVERY DAY, Disp: 45 tablet, Rfl: 9 .  nitroGLYCERIN (NITROSTAT) 0.4 MG SL tablet, Place 1 tablet (0.4 mg total) under the tongue every 5 (five) minutes x 3 doses as needed for chest pain (if no relief after 3rd dose, proceed to the ED for an evaluation)., Disp: 25 tablet, Rfl: 3 .  PROVENTIL HFA 108 (90 Base) MCG/ACT inhaler, INHALE 2 PUFFS BY MOUTH EVERY 6 HOURS AS NEEDED FOR WHEEZING OR SHORTNESS OF BREATH, Disp: 20.1 g, Rfl: 1 .  simvastatin (ZOCOR) 20 MG tablet, TAKE 1 TABLET BY MOUTH AT BEDTIME, Disp: 30 tablet, Rfl: 4 .  XARELTO 20 MG TABS tablet, TAKE 1 Tablet BY MOUTH ONCE DAILY WITH SUPPER, Disp: 90 tablet, Rfl: 3 .  FLUoxetine (PROZAC) 20 MG capsule, Take 1 capsule (20 mg total) by mouth daily. (Patient not taking: Reported on 09/10/2018), Disp: 30 capsule, Rfl: 2 .  metFORMIN (GLUCOPHAGE) 500 MG tablet, Take 1 tablet (500 mg total) by mouth 2 (two) times daily with a meal., Disp: 1 tablet, Rfl: 0   Review of Systems  Constitutional: Positive for fatigue. Negative for appetite change, chills, diaphoresis, fever and unexpected weight change.  HENT: Positive for congestion, dental problem, ear pain and sneezing. Negative for drooling, facial swelling, hearing loss, mouth sores, sore throat, trouble swallowing and voice change.   Eyes: Positive for pain, redness, itching and visual disturbance. Negative for discharge.  Respiratory: Positive for cough, shortness of breath and wheezing. Negative for choking.   Cardiovascular: Positive for palpitations. Negative for chest pain and leg swelling.  Gastrointestinal: Positive for  constipation. Negative for abdominal pain, blood in stool, diarrhea and vomiting.  Endocrine: Positive for heat intolerance and polydipsia. Negative for cold intolerance.  Genitourinary: Negative for decreased urine volume, dysuria and hematuria.  Musculoskeletal: Positive for arthralgias and back pain. Negative for gait problem.  Skin: Negative for rash.  Allergic/Immunologic: Positive for environmental allergies.  Neurological: Positive for headaches. Negative for seizures and syncope.  Hematological: Negative for adenopathy.  Psychiatric/Behavioral: Positive for agitation and dysphoric mood. Negative for suicidal ideas. The patient is nervous/anxious.     Per HPI unless specifically indicated above     Objective:    BP 106/70 (BP Location: Left Arm, Patient Position: Sitting, Cuff Size: Normal)   Pulse (!) 59   Temp 97.7 F (36.5 C) (Other (Comment))   Ht 5\' 11"  (1.803 m)   Wt 206 lb (93.4 kg)   SpO2 99%   BMI 28.73 kg/m   Wt Readings from Last 3 Encounters:  09/10/18 206 lb (93.4 kg)  08/08/18 211 lb (95.7 kg)  06/10/18 211 lb 8 oz (95.9 kg)    Physical Exam Vitals signs reviewed.  Constitutional:      Appearance: He is well-developed.  HENT:     Head: Normocephalic and atraumatic.  Neck:     Musculoskeletal: Neck supple.  Cardiovascular:     Rate and Rhythm: Normal rate and regular rhythm.  Pulmonary:     Effort: Pulmonary effort is normal.     Breath sounds: Normal breath sounds. No wheezing.  Abdominal:     General: Bowel sounds are normal.     Palpations: Abdomen is soft.     Tenderness: There is no abdominal tenderness.  Lymphadenopathy:     Cervical: No cervical adenopathy.  Skin:    General: Skin is warm and dry.     Comments: Great toenail and 2nd toenail on L foot are ingrowning and 2nd toenail is tender.  No redness swelling or signs infection  Neurological:     Mental Status: He is alert and oriented to person, place, and time.  Psychiatric:         Behavior: Behavior normal.     Results for orders placed or performed during the hospital encounter of 09/09/18  Lipid panel  Result Value Ref Range   Cholesterol 140 0 - 200 mg/dL   Triglycerides 130 <150 mg/dL   HDL 30 (L) >40 mg/dL   Total CHOL/HDL Ratio 4.7 RATIO   VLDL 26 0 - 40 mg/dL   LDL Cholesterol 84 0 - 99 mg/dL  Hemoglobin A1c  Result Value Ref Range   Hgb A1c MFr Bld 8.3 (H) 4.8 - 5.6 %   Mean Plasma Glucose 191.51 mg/dL  Comprehensive metabolic panel  Result Value Ref Range   Sodium 137 135 - 145 mmol/L   Potassium 3.6 3.5 -  5.1 mmol/L   Chloride 103 98 - 111 mmol/L   CO2 26 22 - 32 mmol/L   Glucose, Bld 166 (H) 70 - 99 mg/dL   BUN 14 6 - 20 mg/dL   Creatinine, Ser 0.90 0.61 - 1.24 mg/dL   Calcium 9.1 8.9 - 10.3 mg/dL   Total Protein 7.3 6.5 - 8.1 g/dL   Albumin 4.1 3.5 - 5.0 g/dL   AST 14 (L) 15 - 41 U/L   ALT 20 0 - 44 U/L   Alkaline Phosphatase 50 38 - 126 U/L   Total Bilirubin 0.3 0.3 - 1.2 mg/dL   GFR calc non Af Amer >60 >60 mL/min   GFR calc Af Amer >60 >60 mL/min   Anion gap 8 5 - 15      Assessment & Plan:   Encounter Diagnoses  Name Primary?  [name] Uncontrolled diabetes mellitus with complications ([place]) Yes  . Essential hypertension   . Hyperlipidemia, unspecified hyperlipidemia type   . Tobacco use disorder   . Chronic obstructive pulmonary disease, unspecified COPD type ([place])   . Mood disorder ([place])   . Anticoagulant long-term use   . Ingrowing toenail     -reviewed labs with pt -refer for annual DM eye exam -Discussed with pt that he either needs to take metformin bid every day or go on insulin to get a1c in 7's.  Pt says he doesn't want insulin so he will take his metformin bid and watch diabetic diet -refer To podiatrist for L toenails -pt to follow up 3 months.  RTO sooner prn

## 2018-09-16 ENCOUNTER — Ambulatory Visit: Payer: Self-pay | Admitting: Podiatry

## 2018-09-23 ENCOUNTER — Encounter: Payer: Self-pay | Admitting: Podiatry

## 2018-09-23 ENCOUNTER — Ambulatory Visit (INDEPENDENT_AMBULATORY_CARE_PROVIDER_SITE_OTHER): Payer: Self-pay | Admitting: Podiatry

## 2018-09-23 VITALS — BP 120/71

## 2018-09-23 DIAGNOSIS — L600 Ingrowing nail: Secondary | ICD-10-CM

## 2018-09-25 NOTE — Progress Notes (Signed)
Subjective:   Patient ID: Richard [name], male   DOB: 53 y.o.   MRN: [id number]   HPI Patient presents with thick toenails and very poor health with high A1c is currently does smoke quarter pack per day   Review of Systems  All other systems reviewed and are negative.       Objective:  Physical Exam Vitals signs and nursing note reviewed.  Constitutional:      Appearance: He is well-developed.  Pulmonary:     Effort: Pulmonary effort is normal.  Musculoskeletal: Normal range of motion.  Skin:    General: Skin is warm.  Neurological:     Mental Status: He is alert.     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

## 2018-09-30 ENCOUNTER — Other Ambulatory Visit: Payer: Self-pay | Admitting: Physician Assistant

## 2018-10-11 ENCOUNTER — Telehealth: Payer: Self-pay | Admitting: Cardiovascular Disease

## 2018-10-11 NOTE — Telephone Encounter (Signed)
Increase amlodipine to 10 mg daily. Symptoms likely due to hypertension.

## 2018-10-11 NOTE — Telephone Encounter (Signed)
Has he been checking his BP routinely? If so, what have they been?

## 2018-10-11 NOTE — Telephone Encounter (Signed)
Just spoke with wife who states that when pt stoops over or bends down he becomes dizzy and lightheaded. Wife states that he has had this problem before and pt's BP medication was changed. Please advise.

## 2018-10-11 NOTE — Telephone Encounter (Signed)
BP is 149/79 HR in the 50's

## 2018-10-11 NOTE — Telephone Encounter (Signed)
Per phone call from pt's wife, pt is having problems w/ his BP  Also, wanting to know what kind of allergy medication is ok for him to take  Can call [phone]

## 2018-10-11 NOTE — Telephone Encounter (Signed)
Spoke with wife who states that pt has not been recording BP readings. She will have him check BP today and report reading to office.

## 2018-10-14 MED ORDER — AMLODIPINE BESYLATE 10 MG PO TABS: 10.0000 mg | ORAL_TABLET | Freq: Every day | ORAL | 3 refills | Status: DC

## 2018-10-14 NOTE — Telephone Encounter (Signed)
Wife informed of medications changes and voiced understanding

## 2018-10-15 NOTE — Telephone Encounter (Signed)
Please give pt's wife a call concerning pt's medication dose

## 2018-10-15 NOTE — Telephone Encounter (Signed)
Spoke with wife. Informed that pt can take 2 of the 5 mg Amlodipine until new supply arrives. Wife voiced understanding.

## 2018-10-16 ENCOUNTER — Telehealth: Payer: Self-pay | Admitting: Cardiovascular Disease

## 2018-10-16 NOTE — Telephone Encounter (Signed)
Wife works in [employer] and wears gloves, patient was visiting friends and I encouraged him to stay away from crowds.They will follow the CDC  Guidelines and local authorites

## 2018-10-16 NOTE — Telephone Encounter (Signed)
Patient's wife says she is calling to find out "what they do to" in reference to going out during the COVID 19. Also states that she works at a store and wants to know if she needs to take time off. / tg

## 2018-10-25 ENCOUNTER — Other Ambulatory Visit: Payer: Self-pay

## 2018-10-25 MED ORDER — METOPROLOL SUCCINATE ER 50 MG PO TB24: ORAL_TABLET | ORAL | 3 refills | Status: DC

## 2018-10-25 NOTE — Telephone Encounter (Signed)
Refilled toprol per fax request

## 2018-11-05 ENCOUNTER — Other Ambulatory Visit: Payer: Self-pay | Admitting: Physician Assistant

## 2018-11-07 ENCOUNTER — Telehealth: Payer: Self-pay | Admitting: Cardiovascular Disease

## 2018-11-07 NOTE — Telephone Encounter (Signed)
Virtual Visit Pre-Appointment Phone Call  "(Name), I am calling you today to discuss your upcoming appointment. We are currently trying to limit exposure to the virus that causes COVID-19 by seeing patients at home rather than in the office."  1. "What is the BEST phone number to call the day of the visit?" - include this in appointment notes  2. Do you have or have access to (through a family member/friend) a smartphone with video capability that we can use for your visit?" a. If yes - list this number in appt notes as cell (if different from BEST phone #) and list the appointment type as a VIDEO visit in appointment notes b. If no - list the appointment type as a PHONE visit in appointment notes  3. Confirm consent - "In the setting of the current Covid19 crisis, you are scheduled for a (phone or video) visit with your provider on (date) at (time).  Just as we do with many in-office visits, in order for you to participate in this visit, we must obtain consent.  If you'd like, I can send this to your mychart (if signed up) or email for you to review.  Otherwise, I can obtain your verbal consent now.  All virtual visits are billed to your insurance company just like a normal visit would be.  By agreeing to a virtual visit, we'd like you to understand that the technology does not allow for your provider to perform an examination, and thus may limit your provider's ability to fully assess your condition. If your provider identifies any concerns that need to be evaluated in person, we will make arrangements to do so.  Finally, though the technology is pretty good, we cannot assure that it will always work on either your or our end, and in the setting of a video visit, we may have to convert it to a phone-only visit.  In either situation, we cannot ensure that we have a secure connection.  Are you willing to proceed?" STAFF: Did the patient verbally acknowledge consent to telehealth visit? Document  YES/NO here: Yes  4. Advise patient to be prepared - "Two hours prior to your appointment, go ahead and check your blood pressure, pulse, oxygen saturation, and your weight (if you have the equipment to check those) and write them all down. When your visit starts, your provider will ask you for this information. If you have an Apple Watch or Kardia device, please plan to have heart rate information ready on the day of your appointment. Please have a pen and paper handy nearby the day of the visit as well."  5. Give patient instructions for MyChart download to smartphone OR [email or web address] as below if video visit (depending on what platform provider is using)  6. Inform patient they will receive a phone call 15 minutes prior to their appointment time (may be from unknown caller ID) so they should be prepared to answer    Richard [name] has been deemed a candidate for a follow-up tele-health visit to limit community exposure during the Covid-19 pandemic. I spoke with the patient via phone to ensure availability of phone/video source, confirm preferred email & phone number, and discuss instructions and expectations.  I reminded Richard [name] to be prepared with any vital sign and/or heart rhythm information that could potentially be obtained via home monitoring, at the time of his visit. I reminded Richard [name] to expect a phone call prior to  his visit.  Richard [name] 11/07/2018 1:06 PM

## 2018-11-08 ENCOUNTER — Telehealth (INDEPENDENT_AMBULATORY_CARE_PROVIDER_SITE_OTHER): Payer: Self-pay | Admitting: Cardiovascular Disease

## 2018-11-08 ENCOUNTER — Encounter: Payer: Self-pay | Admitting: Cardiovascular Disease

## 2018-11-08 VITALS — BP 125/80 | HR 61 | Ht 71.0 in | Wt 202.0 lb

## 2018-11-08 DIAGNOSIS — I2510 Atherosclerotic heart disease of native coronary artery without angina pectoris: Secondary | ICD-10-CM

## 2018-11-08 DIAGNOSIS — I480 Paroxysmal atrial fibrillation: Secondary | ICD-10-CM

## 2018-11-08 DIAGNOSIS — R002 Palpitations: Secondary | ICD-10-CM

## 2018-11-08 DIAGNOSIS — Z720 Tobacco use: Secondary | ICD-10-CM

## 2018-11-08 DIAGNOSIS — I10 Essential (primary) hypertension: Secondary | ICD-10-CM

## 2018-11-08 DIAGNOSIS — E785 Hyperlipidemia, unspecified: Secondary | ICD-10-CM

## 2018-11-08 NOTE — Progress Notes (Signed)
Virtual Visit via Telephone Note   This visit type was conducted due to national recommendations for restrictions regarding the COVID-19 Pandemic (e.g. social distancing) in an effort to limit this patient's exposure and mitigate transmission in our community.  Due to his co-morbid illnesses, this patient is at least at moderate risk for complications without adequate follow up.  This format is felt to be most appropriate for this patient at this time.  The patient did not have access to video technology/had technical difficulties with video requiring transitioning to audio format only (telephone).  All issues noted in this document were discussed and addressed.  No physical exam could be performed with this format.  Please refer to the patient's chart for his  consent to telehealth for [facility].   Evaluation Performed:  Follow-up visit  Date:  11/08/2018   ID:  Richard [name], Richard [name] 06-10-1966, MRN [id number]  Patient Location: Home Provider Location: Office  PCP:  [name], PA-C  Cardiologist:  Kate Sable, MD  Electrophysiologist:  None   Chief Complaint: High blood pressure  History of Present Illness:    Richard [name] is a 53 y.o. male with a history of hypertension.  I recently increased amlodipine to 10 mg daily as he had called on 10/11/2018 reporting a blood pressure of 149/79 with a heart rate in the 50 bpm range. He has a history of severe anxiety and depression and is followed by behavioral health.  He has a history of very symptomatic paroxysmal atrial fibrillation and failed medical therapy with flecainide and metoprolol.  He was evaluated by EP in September 2018 and an ablation was planned but was delayed due to financial reasons.  He also has a history of nonobstructive coronary artery disease and panic attacks. Specifically, the distal left circumflex had a 65% stenosis , the mid to distal LAD had a 20% stenosis, and the proximal to distal RCA had a 20%  stenosis. No lesions were hemodynamically significant.  Echocardiography on 08/12/15 demonstrated normal left ventricular systolic function and regional wall motion, LVEF 60-65%.  He underwent normal ABIs in September 2018.  He is doing well overall and denies chest pain and shortness of breath.  He had not had any palpitations for quite some time but has had some palpitations over this past week.  He denies leg swelling.  He may get slightly lightheaded if he bends over and stands up too quickly but this is seldom in occurrence.  The patient does not have symptoms concerning for COVID-19 infection (fever, chills, cough, or new shortness of breath).    Past Medical History:  Diagnosis Date  . Arthritis    "legs, spine, shoulders" (08/12/2015)  . Asthma   . Colitis 1990  . COPD (chronic obstructive pulmonary disease) ([place])   . Depression   . Gastric ulcer 2003; 2012   2003: + esophagitis; negative H.pylori serology  2012: Dr. Oneida Alar, mild gastritis, Bravo PH probe placement, negative H.pylori  . GERD (gastroesophageal reflux disease)   . Hepatic steatosis   . History of hiatal hernia   . Hyperlipemia   . Hypertension   . Mild CAD    a. Cardiac cath 07/2015 showed 65% distal Cx, 20% mid-distal LAD, 20% prox-distal RCA, EF 60%, EDP 23mmHg.  [name] Overweight   . Panic attacks   . Paroxysmal atrial fibrillation ([facility])   . Stroke [facility])    mucsle in left side of face dosent work as its supposed to-only deficit.  [name] TIA (  transient ischemic attack)    after cath 1/ 17  . Tobacco abuse    1/2 pack per day  . Type II diabetes mellitus ([place])    Past Surgical History:  Procedure Laterality Date  . BRAVO [place] STUDY  05/03/2011   YWV:PXTG gastritis/normal esophagus and duodenum  . CARDIAC CATHETERIZATION  1990s X 1; 2005; 08/12/2015  . CARDIAC CATHETERIZATION N/A 08/12/2015   Procedure: Left Heart Cath and Coronary Angiography;  Surgeon: Belva Crome, MD; LAD 20%, CFX 65%, RCA 20%, EF 60%   .  COLONOSCOPY  1990  . COLONOSCOPY WITH PROPOFOL N/A 11/21/2016   Dr. Oneida Alar: non-thrombosed external hemorrhoids, one 6 mm polyp (polypoid lesion), internal hemorrhoids. TI Normal. 10 years screening  . ESOPHAGOGASTRODUODENOSCOPY  05/03/2011   GYI:RSWN gastritis  . NECK MASS EXCISION Right    "done in dr's office; behind right ear/side of ncek"  . POLYPECTOMY  11/21/2016   Procedure: POLYPECTOMY;  Surgeon: [name], MD;  Location: [facility];  Service: Endoscopy;;  descending colon polyp  . SHOULDER ARTHROSCOPY W/ ROTATOR CUFF REPAIR Right 2006   acromioclavicular joint arthrosis     Current Meds  Medication Sig  . acetaminophen (TYLENOL) 325 MG tablet Take 650 mg by mouth every 6 (six) hours as needed for pain or fever.   . ALPRAZolam (XANAX) 0.5 MG tablet Take one tablet by mouth three times a day and two tablets at night  . amLODipine (NORVASC) 10 MG tablet Take 1 tablet (10 mg total) by mouth daily.  . budesonide-formoterol (SYMBICORT) 80-4.5 MCG/ACT inhaler Inhale 2 puffs into the lungs 2 (two) times daily as needed.   . calcium carbonate (TUMS - DOSED IN MG ELEMENTAL CALCIUM) 500 MG chewable tablet Chew 1 tablet by mouth daily as needed for indigestion or heartburn.  . cyclobenzaprine (FLEXERIL) 10 MG tablet Take 1 tablet (10 mg total) by mouth 3 (three) times daily. (Patient taking differently: Take 10 mg by mouth 3 (three) times daily as needed for muscle spasms. )  . esomeprazole (NEXIUM) 20 MG capsule Take 20 mg by mouth every morning.   [name] HYDROcodone-acetaminophen (NORCO/VICODIN) 5-325 MG tablet Take one to two tablets every 4-6 hours as needed for pain  . JANUVIA 100 MG tablet TAKE 1 Tablet BY MOUTH ONCE DAILY  . lisinopril (PRINIVIL,ZESTRIL) 10 MG tablet Take 1 tablet (10 mg total) by mouth daily.  . metFORMIN (GLUCOPHAGE) 500 MG tablet Take 1 tablet (500 mg total) by mouth 2 (two) times daily with a meal.  . metoprolol succinate (TOPROL-XL) 50 MG 24 hr tablet TAKE 1 AND A  HALF TABLETS BY MOUTH EVERY DAY  . nitroGLYCERIN (NITROSTAT) 0.4 MG SL tablet Place 1 tablet (0.4 mg total) under the tongue every 5 (five) minutes x 3 doses as needed for chest pain (if no relief after 3rd dose, proceed to the ED for an evaluation).  [name] PROVENTIL HFA 108 (90 Base) MCG/ACT inhaler INHALE 2 PUFFS BY MOUTH EVERY 6 HOURS AS NEEDED FOR WHEEZING OR SHORTNESS OF BREATH  . simvastatin (ZOCOR) 20 MG tablet TAKE 1 TABLET BY MOUTH ONCE DAILY AT BEDTIME  . XARELTO 20 MG TABS tablet TAKE 1 Tablet BY MOUTH ONCE DAILY WITH SUPPER     Allergies:   Dexilant [dexlansoprazole]; Mushroom ext cmplx(shiitake-reishi-mait); Penicillins; and Doxycycline   Social History   Tobacco Use  . Smoking status: Current Every Day Smoker    Packs/day: 0.25    Years: 25.00    Pack years: 6.25  Types: Cigarettes    Start date: 07/17/1982  . Smokeless tobacco: Never Used  Substance Use Topics  . Alcohol use: No    Alcohol/week: 0.0 standard drinks  . Drug use: No     Family Hx: The patient's family history includes ADD / ADHD in his daughter; Alcohol abuse in his father and mother; Anxiety disorder in his sister and sister; Dementia in his cousin and paternal uncle; Depression in his sister; Diabetes in his brother and father; Heart attack (age of onset: 54) in his brother; Heart attack (age of onset: 70) in his father; Hypertension in his brother, brother, and brother; Lung cancer in his mother; Seizures in his brother. There is no history of Colon cancer, Drug abuse, Bipolar disorder, OCD, Paranoid behavior, Schizophrenia, Sexual abuse, or Physical abuse.  ROS:   Please see the history of present illness.     All other systems reviewed and are negative.   Prior CV studies:   The following studies were reviewed today:  See above  Labs/Other Tests and Data Reviewed:    EKG:  No ECG reviewed.  Recent Labs: 04/04/2018: Hemoglobin 16.7; Platelets 187 05/31/2018: B Natriuretic Peptide 24.0  09/09/2018: ALT 20; BUN 14; Creatinine, Ser 0.90; Potassium 3.6; Sodium 137   Recent Lipid Panel Lab Results  Component Value Date/Time   CHOL 140 09/09/2018 09:18 AM   TRIG 130 09/09/2018 09:18 AM   HDL 30 (L) 09/09/2018 09:18 AM   CHOLHDL 4.7 09/09/2018 09:18 AM   LDLCALC 84 09/09/2018 09:18 AM    Wt Readings from Last 3 Encounters:  11/08/18 202 lb (91.6 kg)  09/10/18 206 lb (93.4 kg)  08/08/18 211 lb (95.7 kg)     Objective:    Vital Signs:  BP 125/80   Pulse 61   Ht 5\' 11"  (1.803 m)   Wt 202 lb (91.6 kg)   BMI 28.17 kg/m    VITAL SIGNS:  reviewed  ASSESSMENT & PLAN:    1. Nonobstructive CAD: Symptomatically stable. Cath results noted above. Continue metoprolol and statin. No further workup is indicated.  2. Essential HTN: Controlled on current therapy. No changes.  3. Paroxysmal atrial fibrillationand typical atrial flutterwith debilitating palpitations: Atrial fibrillation ablation is being planned.  He is anticoagulated with Xarelto.  Continue metoprolol.  He is less symptomatic when not taking flecainide.  He will follow-up with Dr. Rayann Heman.  4. Hyperlipidemia:Continue statin.  5. Tobacco abuse: Trying to quit.  COVID-19 Education: The signs and symptoms of COVID-19 were discussed with the patient and how to seek care for testing (follow up with PCP or arrange E-visit).  The importance of social distancing was discussed today.  Time:   Today, I have spent 15 minutes with the patient with telehealth technology discussing the above problems.     Medication Adjustments/Labs and Tests Ordered: Current medicines are reviewed at length with the patient today.  Concerns regarding medicines are outlined above.   Tests Ordered: No orders of the defined types were placed in this encounter.   Medication Changes: No orders of the defined types were placed in this encounter.   Disposition:  Follow up with me as needed. Follow up with Dr. Rayann Heman.  Signed,  Kate Sable, MD  11/08/2018 1:39 PM    [facility]

## 2018-11-08 NOTE — Patient Instructions (Signed)
Medication Instructions: Your physician recommends that you continue on your current medications as directed. Please refer to the Current Medication list given to you today.   Labwork: None today  Procedures/Testing: None today  Follow-Up:  3 months with Dr.Allred  You no longer need to see Dr.Koneswaran  Any Additional Special Instructions Will Be Listed Below (If Applicable).     If you need a refill on your cardiac medications before your next appointment, please call your pharmacy.     Thank you for choosing [facility] !

## 2018-11-11 NOTE — Progress Notes (Signed)
REVIEWED-NO ADDITIONAL RECOMMENDATIONS. 

## 2018-11-11 NOTE — Telephone Encounter (Signed)
PT NEEDS AN OPV IN AUG 2020 IN ORDER TO CONTINUE TO RECEIVE SAMPLES.

## 2018-11-12 NOTE — Progress Notes (Signed)
REVIEWED-NO ADDITIONAL RECOMMENDATIONS. 

## 2018-11-12 NOTE — Telephone Encounter (Signed)
ON RECALL  °

## 2018-11-14 ENCOUNTER — Other Ambulatory Visit: Payer: Self-pay

## 2018-11-14 ENCOUNTER — Ambulatory Visit (INDEPENDENT_AMBULATORY_CARE_PROVIDER_SITE_OTHER): Payer: Self-pay | Admitting: Psychiatry

## 2018-11-14 ENCOUNTER — Encounter (HOSPITAL_COMMUNITY): Payer: Self-pay | Admitting: Psychiatry

## 2018-11-14 DIAGNOSIS — F4001 Agoraphobia with panic disorder: Secondary | ICD-10-CM

## 2018-11-14 MED ORDER — ALPRAZOLAM 0.5 MG PO TABS: ORAL_TABLET | ORAL | 2 refills | Status: DC

## 2018-11-14 NOTE — Progress Notes (Signed)
Virtual Visit via Telephone Note  I connected with [place] on 11/14/18 at  2:20 PM EDT by telephone and verified that I am speaking with the correct person using two identifiers.   I discussed the limitations, risks, security and privacy concerns of performing an evaluation and management service by telephone and the availability of in person appointments. I also discussed with the patient that there may be a patient responsible charge related to this service. The patient expressed understanding and agreed to proceed.       I discussed the assessment and treatment plan with the patient. The patient was provided an opportunity to ask questions and all were answered. The patient agreed with the plan and demonstrated an understanding of the instructions.   The patient was advised to call back or seek an in-person evaluation if the symptoms worsen or if the condition fails to improve as anticipated.  I provided 15 minutes of non-face-to-face time during this encounter.   [name], MD  [facility] MD/PA/NP OP Progress Note  11/14/2018 3:14 PM Richard [name]  MRN:  [id number]  Chief Complaint:  Chief Complaint    Anxiety; Follow-up     Richard [name]:[id number] is a 53 year old Caucasian male who came for his followup appointment. He is currently living with his wife in [place]. He is unemployed and applying for disability.  The patient states that he has been employed for 5 years. 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 He got injured on the job and both shoulders were torn. He has not been able to work ever since and he feels very badly about this. 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  Since he's not been able to work the patient has been having increasingly depressed and anxious. He feels like his body gives out when he is trying to exert himself even in a minor way.  The patient returns for follow-up after 3  months.  He is evaluated on the phone as he was unable to connect to the video telemedicine today.  He states that in general he is doing okay.  He has been mostly staying at home but still goes over to his ex-father-in-law's garage and they sit and talk for a bit but keep 6 feet apart.  He is not going out in the community at all.  His mood has generally been good and he denies symptoms of severe depression or suicidal ideation.  He is having a lot of elbow and neck pain which is bothering him and affecting his sleep.  He thinks the Xanax is still helping a good deal with his anxiety.  We had talked about trying Prozac since he seems somewhat depressed last time but he never started it.  He claims that he took it before and it "may be see trees move." Visit Diagnosis:    ICD-10-CM   1. 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 Arthritis    "legs, spine, shoulders" (08/12/2015)  . Asthma   . Colitis 1990  . COPD (chronic obstructive pulmonary disease) ([place])   . Depression   . Gastric ulcer 2003; 2012   2003: + esophagitis; negative H.pylori serology  2012: Richard [name], mild gastritis, Bravo PH probe placement, negative H.pylori  . GERD (gastroesophageal reflux disease)   .  Hepatic steatosis   . History of hiatal hernia   . Hyperlipemia   . Hypertension   . Mild CAD    a. Cardiac cath 07/2015 showed 65% distal Cx, 20% mid-distal LAD, 20% prox-distal RCA, EF 60%, EDP 60mmHg.  Richard [name] Overweight   . Panic attacks   . Paroxysmal atrial fibrillation (HCC)   . Stroke Richard [name] Dba)    mucsle in left side of face dosent work as its supposed to-only deficit.  Richard [name] TIA (transient ischemic attack)    after cath 1/ 17  . Tobacco abuse    1/2 pack per day  . Type II diabetes mellitus ([place])     Past Surgical History:  Procedure Laterality Date  . BRAVO [place] STUDY  05/03/2011   GMW:NUUV  gastritis/normal esophagus and duodenum  . CARDIAC CATHETERIZATION  1990s X 1; 2005; 08/12/2015  . CARDIAC CATHETERIZATION N/A 08/12/2015   Procedure: Left Heart Cath and Coronary Angiography;  Surgeon: Richard Crome, MD; LAD 20%, CFX 65%, RCA 20%, EF 60%   . COLONOSCOPY  1990  . COLONOSCOPY WITH PROPOFOL N/A 11/21/2016   Richard [name]: non-thrombosed external hemorrhoids, one 6 mm polyp (polypoid lesion), internal hemorrhoids. TI Normal. 10 years screening  . ESOPHAGOGASTRODUODENOSCOPY  05/03/2011   OZD:GUYQ gastritis  . NECK MASS EXCISION Right    "done in dr's office; behind right ear/side of ncek"  . POLYPECTOMY  11/21/2016   Procedure: POLYPECTOMY;  Surgeon: Richard Binder, MD;  Location: [facility];  Service: Endoscopy;;  descending colon polyp  . SHOULDER ARTHROSCOPY W/ ROTATOR CUFF REPAIR Right 2006   acromioclavicular joint arthrosis    Family Psychiatric History: see below  Family History:  Family History  Problem Relation Age of Onset  . Lung cancer Mother   . Alcohol abuse Mother   . Heart attack Father 59  . Diabetes Father   . Alcohol abuse Father   . Hypertension Brother   . Hypertension Brother   . Anxiety disorder Sister   . Depression Sister   . Anxiety disorder Sister   . Heart attack Brother 17  . Diabetes Brother   . Hypertension Brother   . Seizures Brother   . Dementia Paternal Uncle   . Dementia Cousin   . ADD / ADHD Daughter   . Colon cancer Neg Hx   . Drug abuse Neg Hx   . Bipolar disorder Neg Hx   . OCD Neg Hx   . Paranoid behavior Neg Hx   . Schizophrenia Neg Hx   . Sexual abuse Neg Hx   . Physical abuse Neg Hx     Social History:  Social History   Socioeconomic History  . Marital status: Married    Spouse name: Not on file  . Number of children: Not on file  . Years of education: Not on file  . Highest education level: Not on file  Occupational History  . Occupation: full time    Employer: UNEMPLOYED  Social Needs  . Financial  resource strain: Not on file  . Food insecurity:    Worry: Not on file    Inability: Not on file  . Transportation needs:    Medical: Not on file    Non-medical: Not on file  Tobacco Use  . Smoking status: Current Every Day Smoker    Packs/day: 0.25    Years: 25.00    Pack years: 6.25    Types: Cigarettes    Start date: 07/17/1982  . Smokeless tobacco: Never Used  Substance and Sexual Activity  . Alcohol use: No    Alcohol/week: 0.0 standard drinks  . Drug use: No  . Sexual activity: Yes    Birth control/protection: None  Lifestyle  . Physical activity:    Days per week: Not on file    Minutes per session: Not on file  . Stress: Not on file  Relationships  . Social connections:    Talks on phone: Not on file    Gets together: Not on file    Attends religious service: Not on file    Active member of club or organization: Not on file    Attends meetings of clubs or organizations: Not on file    Relationship status: Not on file  Other Topics Concern  . Not on file  Social History Narrative   Pt lives in [place] with wife.  5 children.  Unemployed due to panic attacks and back pain    Allergies:  Allergies  Allergen Reactions  . Dexilant [Dexlansoprazole] Anaphylaxis  . Mushroom Ext Cmplx(Shiitake-Reishi-Mait) Anaphylaxis    Rapid heart rate.  . Penicillins Anaphylaxis    Has patient had a PCN reaction causing immediate rash, facial/tongue/throat swelling, SOB or lightheadedness with hypotension: Yes Has patient had a PCN reaction causing severe rash involving mucus membranes or skin necrosis: No Has patient had a PCN reaction that required hospitalization Yes Has patient had a PCN reaction occurring within the last 10 years: No If all of the above answers are "NO", then may proceed with Cephalosporin use.   Richard [name] Doxycycline     Metabolic Disorder Labs: Lab Results  Component Value Date   HGBA1C 8.3 (H) 09/09/2018   MPG 191.51 09/09/2018   MPG 191.51  05/31/2018   No results found for: PROLACTIN Lab Results  Component Value Date   CHOL 140 09/09/2018   TRIG 130 09/09/2018   HDL 30 (L) 09/09/2018   CHOLHDL 4.7 09/09/2018   VLDL 26 09/09/2018   LDLCALC 84 09/09/2018   LDLCALC 84 05/31/2018   Lab Results  Component Value Date   TSH 0.958 04/21/2011   TSH 1.402 05/27/2009    Therapeutic Level Labs: No results found for: LITHIUM No results found for: VALPROATE No components found for:  CBMZ  Current Medications: Current Outpatient Medications  Medication Sig Dispense Refill  . acetaminophen (TYLENOL) 325 MG tablet Take 650 mg by mouth every 6 (six) hours as needed for pain or fever.     . ALPRAZolam (XANAX) 0.5 MG tablet Take one tablet by mouth three times a day and two tablets at night 150 tablet 2  . amLODipine (NORVASC) 10 MG tablet Take 1 tablet (10 mg total) by mouth daily. 90 tablet 3  . budesonide-formoterol (SYMBICORT) 80-4.5 MCG/ACT inhaler Inhale 2 puffs into the lungs 2 (two) times daily as needed.     . calcium carbonate (TUMS - DOSED IN MG ELEMENTAL CALCIUM) 500 MG chewable tablet Chew 1 tablet by mouth daily as needed for indigestion or heartburn.    . cyclobenzaprine (FLEXERIL) 10 MG tablet Take 1 tablet (10 mg total) by mouth 3 (three) times daily. (Patient taking differently: Take 10 mg by mouth 3 (three) times daily as needed for muscle spasms. ) 21 tablet 0  . esomeprazole (NEXIUM) 20 MG capsule Take 20 mg by mouth every morning.     Richard [name] HYDROcodone-acetaminophen (NORCO/VICODIN) 5-325 MG tablet Take one to two tablets every 4-6 hours as needed for pain 40 tablet 0  . JANUVIA 100 MG  tablet TAKE 1 Tablet BY MOUTH ONCE DAILY 90 tablet 1  . lisinopril (PRINIVIL,ZESTRIL) 10 MG tablet Take 1 tablet (10 mg total) by mouth daily. 90 tablet 3  . metFORMIN (GLUCOPHAGE) 500 MG tablet Take 1 tablet (500 mg total) by mouth 2 (two) times daily with a meal. 1 tablet 0  . metoprolol succinate (TOPROL-XL) 50 MG 24 hr tablet TAKE  1 AND A HALF TABLETS BY MOUTH EVERY DAY 135 tablet 3  . nitroGLYCERIN (NITROSTAT) 0.4 MG SL tablet Place 1 tablet (0.4 mg total) under the tongue every 5 (five) minutes x 3 doses as needed for chest pain (if no relief after 3rd dose, proceed to the ED for an evaluation). 25 tablet 3  . PROVENTIL HFA 108 (90 Base) MCG/ACT inhaler INHALE 2 PUFFS BY MOUTH EVERY 6 HOURS AS NEEDED FOR WHEEZING OR SHORTNESS OF BREATH 20.1 g 1  . simvastatin (ZOCOR) 20 MG tablet TAKE 1 TABLET BY MOUTH ONCE DAILY AT BEDTIME 30 tablet 4  . XARELTO 20 MG TABS tablet TAKE 1 Tablet BY MOUTH ONCE DAILY WITH SUPPER 90 tablet 3   No current facility-administered medications for this visit.      Musculoskeletal: Strength & Muscle Tone: within normal limits Gait & Station: normal Patient leans: N/A  Psychiatric Specialty Exam: Review of Systems  Cardiovascular: Positive for palpitations.  Musculoskeletal: Positive for joint pain and neck pain.  Psychiatric/Behavioral: The patient is nervous/anxious.   All other systems reviewed and are negative.   There were no vitals taken for this visit.There is no height or weight on file to calculate BMI.  General Appearance: NA  Eye Contact:  NA  Speech:  Clear and Coherent  Volume:  Normal  Mood:  Anxious  Affect:  NA  Thought Process:  Goal Directed  Orientation:  Full (Time, Place, and Person)  Thought Content: Rumination   Suicidal Thoughts:  No  Homicidal Thoughts:  No  Memory:  Immediate;   Good Recent;   Good Remote;   Fair  Judgement:  Fair  Insight:  Fair  Psychomotor Activity:  Decreased  Concentration:  Concentration: Good and Attention Span: Good  Recall:  Good  Fund of Knowledge: Fair  Language: Good  Akathisia:  No  Handed:  Right  AIMS (if indicated): not done  Assets:  Communication Skills Desire for Improvement Resilience Social Support Talents/Skills  ADL's:  Intact  Cognition: WNL  Sleep:  Fair   Screenings: PHQ2-9     Patient Outreach  Telephone from 08/30/2015 in [facility] Patient Outreach Telephone from 08/26/2015 in [facility]  PHQ-2 Total Score  2  4  PHQ-9 Total Score  8  8       Assessment and Plan:  This patient is a 53 year old male with a history of anxiety and severe panic attacks.  He states that the Xanax is working well for him.  He is never been able to tolerate any of the SSRIs.  He will continue Xanax 0.5 mg 3 times daily and 1 mg at bedtime.  He will return to see me in 3 months  [name], MD 11/14/2018, 3:14 PM

## 2018-12-10 ENCOUNTER — Encounter: Payer: Self-pay | Admitting: Physician Assistant

## 2018-12-10 ENCOUNTER — Ambulatory Visit: Payer: Self-pay | Admitting: Physician Assistant

## 2018-12-10 DIAGNOSIS — E1165 Type 2 diabetes mellitus with hyperglycemia: Secondary | ICD-10-CM

## 2018-12-10 DIAGNOSIS — E785 Hyperlipidemia, unspecified: Secondary | ICD-10-CM

## 2018-12-10 DIAGNOSIS — M25521 Pain in right elbow: Secondary | ICD-10-CM

## 2018-12-10 DIAGNOSIS — F17200 Nicotine dependence, unspecified, uncomplicated: Secondary | ICD-10-CM

## 2018-12-10 DIAGNOSIS — Z9119 Patient's noncompliance with other medical treatment and regimen: Secondary | ICD-10-CM

## 2018-12-10 DIAGNOSIS — I10 Essential (primary) hypertension: Secondary | ICD-10-CM

## 2018-12-10 DIAGNOSIS — Z7901 Long term (current) use of anticoagulants: Secondary | ICD-10-CM

## 2018-12-10 DIAGNOSIS — G8929 Other chronic pain: Secondary | ICD-10-CM

## 2018-12-10 DIAGNOSIS — M545 Low back pain, unspecified: Secondary | ICD-10-CM

## 2018-12-10 DIAGNOSIS — Z91199 Patient's noncompliance with other medical treatment and regimen due to unspecified reason: Secondary | ICD-10-CM

## 2018-12-10 DIAGNOSIS — IMO0002 Reserved for concepts with insufficient information to code with codable children: Secondary | ICD-10-CM

## 2018-12-10 DIAGNOSIS — J449 Chronic obstructive pulmonary disease, unspecified: Secondary | ICD-10-CM

## 2018-12-10 DIAGNOSIS — F39 Unspecified mood [affective] disorder: Secondary | ICD-10-CM

## 2018-12-10 NOTE — Progress Notes (Signed)
There were no vitals taken for this visit.   Subjective:    Patient ID: Richard [name], male    DOB: Sep 19, 1965, 53 y.o.   MRN: [id number]  HPI: Richard [name] is a 53 y.o. male presenting on 12/10/2018 for No chief complaint on file.   HPI    This is a telemendicine visit due to coronavirus pandemic.  appointemnt is via telephone as pt does not have a smartphone  I connected with  [place] on 12/10/18 by a video enabled telemedicine application and verified that I am speaking with the correct person using two identifiers.   I discussed the limitations of evaluation and management by telemedicine. The patient expressed understanding and agreed to proceed.   Pt is at home.  Provider is at office/clinic  Pt is still not taking his metformin but once daily.    He is still seeing [name] for [facility] issues.   He says his Anxiety has intensified  Pt is Still wanting orthopedic surgery.  It is on hold until he can get his a1c to 7.5.  Discussed with pt that it is odd that he wants surgery but isn't taking his diabetic medications properly which will ensure that his surgery will be delayed.  He agreed that it didn't make any sense.   Pt has no other complaints.  He is not having any fevers or new cough.   Relevant past medical, surgical, family and social history reviewed and updated as indicated. Interim medical history since our last visit reviewed. Allergies and medications reviewed and updated.   Current Outpatient Medications:  .  acetaminophen (TYLENOL) 325 MG tablet, Take 650 mg by mouth every 6 (six) hours as needed for pain or fever. , Disp: , Rfl:  .  ALPRAZolam (XANAX) 0.5 MG tablet, Take one tablet by mouth three times a day and two tablets at night, Disp: 150 tablet, Rfl: 2 .  amLODipine (NORVASC) 10 MG tablet, Take 1 tablet (10 mg total) by mouth daily., Disp: 90 tablet, Rfl: 3 .  calcium carbonate (TUMS - DOSED IN MG ELEMENTAL CALCIUM) 500 MG chewable tablet, Chew 1  tablet by mouth daily as needed for indigestion or heartburn., Disp: , Rfl:  .  cyclobenzaprine (FLEXERIL) 10 MG tablet, Take 1 tablet (10 mg total) by mouth 3 (three) times daily. (Patient taking differently: Take 10 mg by mouth 3 (three) times daily as needed for muscle spasms. ), Disp: 21 tablet, Rfl: 0 .  esomeprazole (NEXIUM) 20 MG capsule, Take 20 mg by mouth every morning. , Disp: , Rfl:  .  HYDROcodone-acetaminophen (NORCO/VICODIN) 5-325 MG tablet, Take one to two tablets every 4-6 hours as needed for pain, Disp: 40 tablet, Rfl: 0 .  JANUVIA 100 MG tablet, TAKE 1 Tablet BY MOUTH ONCE DAILY, Disp: 90 tablet, Rfl: 1 .  lisinopril (PRINIVIL,ZESTRIL) 10 MG tablet, Take 1 tablet (10 mg total) by mouth daily., Disp: 90 tablet, Rfl: 3 .  metFORMIN (GLUCOPHAGE) 500 MG tablet, Take 1 tablet (500 mg total) by mouth 2 (two) times daily with a meal., Disp: 1 tablet, Rfl: 0 .  metoprolol succinate (TOPROL-XL) 50 MG 24 hr tablet, TAKE 1 AND A HALF TABLETS BY MOUTH EVERY DAY, Disp: 135 tablet, Rfl: 3 .  nitroGLYCERIN (NITROSTAT) 0.4 MG SL tablet, Place 1 tablet (0.4 mg total) under the tongue every 5 (five) minutes x 3 doses as needed for chest pain (if no relief after 3rd dose, proceed to the ED for  an evaluation)., Disp: 25 tablet, Rfl: 3 .  PROVENTIL HFA 108 (90 Base) MCG/ACT inhaler, INHALE 2 PUFFS BY MOUTH EVERY 6 HOURS AS NEEDED FOR WHEEZING OR SHORTNESS OF BREATH, Disp: 20.1 g, Rfl: 1 .  simvastatin (ZOCOR) 20 MG tablet, TAKE 1 TABLET BY MOUTH ONCE DAILY AT BEDTIME, Disp: 30 tablet, Rfl: 4 .  XARELTO 20 MG TABS tablet, TAKE 1 Tablet BY MOUTH ONCE DAILY WITH SUPPER, Disp: 90 tablet, Rfl: 3 .  budesonide-formoterol (SYMBICORT) 80-4.5 MCG/ACT inhaler, Inhale 2 puffs into the lungs 2 (two) times daily as needed. , Disp: , Rfl:      Review of Systems  Per HPI unless specifically indicated above     Objective:    There were no vitals taken for this visit.  Wt Readings from Last 3 Encounters:   11/08/18 202 lb (91.6 kg)  09/10/18 206 lb (93.4 kg)  08/08/18 211 lb (95.7 kg)    Physical Exam Pulmonary:     [place]: Pulmonary effort is normal. No respiratory distress.  Neurological:     Mental Status: He is alert and oriented to person, place, and time.  Psychiatric:        Attention and Perception: Attention normal.        Speech: Speech normal.        Behavior: Behavior is cooperative.        Cognition and Memory: Cognition normal.         Assessment & Plan:   Encounter Diagnoses  Name Primary?  [name] Uncontrolled diabetes mellitus with complications ([place]) Yes  . Personal history of noncompliance with medical treatment, presenting hazards to health   . Essential hypertension   . Hyperlipidemia, unspecified hyperlipidemia type   . Tobacco use disorder   . Chronic obstructive pulmonary disease, unspecified COPD type ([place])   . Mood disorder ([place])   . Anticoagulant long-term use   . Chronic low back pain without sciatica, unspecified back pain laterality   . Right elbow pain     -will Update labs.  Pt will have those drawn this week and he will be called with results -pt encouraged to take his metformin bid as prescribed -no medication changes today -pt to continue with [name] for mental health issues -pt to continue with his cardiologists per their recommendation -pt to follow up here in 3 months.  He is to contact office sooner prn

## 2019-01-08 ENCOUNTER — Ambulatory Visit: Payer: Self-pay | Admitting: Internal Medicine

## 2019-01-20 ENCOUNTER — Other Ambulatory Visit: Payer: Self-pay

## 2019-01-20 ENCOUNTER — Other Ambulatory Visit (HOSPITAL_COMMUNITY)
Admission: RE | Admit: 2019-01-20 | Discharge: 2019-01-20 | Disposition: A | Discharge status: Home / Self Care | Payer: Self-pay | Source: Ambulatory Visit | Attending: Physician Assistant | Admitting: Physician Assistant

## 2019-01-20 DIAGNOSIS — Z125 Encounter for screening for malignant neoplasm of prostate: Secondary | ICD-10-CM | POA: Insufficient documentation

## 2019-01-20 DIAGNOSIS — E785 Hyperlipidemia, unspecified: Secondary | ICD-10-CM | POA: Insufficient documentation

## 2019-01-20 DIAGNOSIS — E118 Type 2 diabetes mellitus with unspecified complications: Secondary | ICD-10-CM | POA: Insufficient documentation

## 2019-01-20 DIAGNOSIS — IMO0002 Reserved for concepts with insufficient information to code with codable children: Secondary | ICD-10-CM

## 2019-01-20 DIAGNOSIS — E1165 Type 2 diabetes mellitus with hyperglycemia: Secondary | ICD-10-CM | POA: Insufficient documentation

## 2019-01-20 DIAGNOSIS — I10 Essential (primary) hypertension: Secondary | ICD-10-CM | POA: Insufficient documentation

## 2019-01-20 LAB — COMPREHENSIVE METABOLIC PANEL
ALT: 17 U/L (ref 0–44)
AST: 14 U/L — ABNORMAL LOW (ref 15–41)
Albumin: 4 g/dL (ref 3.5–5.0)
Alkaline Phosphatase: 54 U/L (ref 38–126)
Anion gap: 10 (ref 5–15)
BUN: 14 mg/dL (ref 6–20)
CO2: 26 mmol/L (ref 22–32)
Calcium: 9 mg/dL (ref 8.9–10.3)
Chloride: 102 mmol/L (ref 98–111)
Creatinine, Ser: 0.96 mg/dL (ref 0.61–1.24)
GFR calc Af Amer: >60 mL/min (ref >60)
GFR calc non Af Amer: >60 mL/min (ref >60)
Glucose, Bld: 162 mg/dL — ABNORMAL HIGH (ref 70–99)
Potassium: 3.5 mmol/L (ref 3.5–5.1)
Sodium: 138 mmol/L (ref 135–145)
Total Bilirubin: 0.3 mg/dL (ref 0.3–1.2)
Total Protein: 7 g/dL (ref 6.5–8.1)

## 2019-01-20 LAB — HEMOGLOBIN A1C
Hgb A1c MFr Bld: 7 % — ABNORMAL HIGH (ref 4.8–5.6)
Mean Plasma Glucose: 154.2 mg/dL

## 2019-01-20 LAB — LIPID PANEL
Cholesterol: 127 mg/dL (ref 0–200)
HDL: 30 mg/dL — ABNORMAL LOW (ref >40)
LDL Cholesterol: 62 mg/dL (ref 0–99)
Total CHOL/HDL Ratio: 4.2 RATIO
Triglycerides: 174 mg/dL — ABNORMAL HIGH (ref <150)
VLDL: 35 mg/dL (ref 0–40)

## 2019-01-20 LAB — PSA: Prostatic Specific Antigen: 0.34 ng/mL (ref 0.00–4.00)

## 2019-01-21 LAB — MICROALBUMIN, URINE: Microalb, Ur: 6.4 ug/mL — ABNORMAL HIGH

## 2019-01-27 ENCOUNTER — Other Ambulatory Visit: Payer: Self-pay | Admitting: Cardiovascular Disease

## 2019-01-27 ENCOUNTER — Other Ambulatory Visit: Payer: Self-pay | Admitting: Physician Assistant

## 2019-02-12 ENCOUNTER — Telehealth: Payer: Self-pay | Admitting: *Deleted

## 2019-02-12 NOTE — Telephone Encounter (Signed)
Medication and allergies reviewed Instructions for mychart video given and sent to mychart Will have vitals available for visit.

## 2019-02-14 ENCOUNTER — Telehealth (INDEPENDENT_AMBULATORY_CARE_PROVIDER_SITE_OTHER): Payer: Self-pay | Admitting: Internal Medicine

## 2019-02-14 DIAGNOSIS — I480 Paroxysmal atrial fibrillation: Secondary | ICD-10-CM

## 2019-02-14 DIAGNOSIS — I10 Essential (primary) hypertension: Secondary | ICD-10-CM

## 2019-02-14 NOTE — Progress Notes (Signed)
Electrophysiology TeleHealth Note    Due to national recommendations of social distancing due to [place] 19, an audio telehealth visit is felt to be most appropriate for this patient at this time.  Verbal consent was obtained by me for the telehealth visit today.  The patient does not have capability for a virtual visit.  A phone visit is therefore required today.   Date:  02/14/2019   ID:  [name], DOB 09/26/1965, MRN [id number]  Location: patient's home  Provider location:  [facility]  Evaluation Performed: Follow-up visit  PCP:  [name], PA-C   Electrophysiologist:  Dr Rayann Heman  Chief Complaint:  AF  History of Present Illness:    Richard [name] is a 53 y.o. male who presents via telehealth conferencing today.  Since last being seen in our clinic, the patient reports doing relatively well.  He continues with ongoing episodes of AF that can be triggered by certain foods. Today, he denies symptoms of  chest pain, shortness of breath,  lower extremity edema, dizziness, presyncope, or syncope.  The patient is otherwise without complaint today.  The patient denies symptoms of fevers, chills, cough, or new SOB worrisome for COVID 19.  Past Medical History:  Diagnosis Date  . Arthritis    "legs, spine, shoulders" (08/12/2015)  . Asthma   . Colitis 1990  . COPD (chronic obstructive pulmonary disease) ([place])   . Depression   . Gastric ulcer 2003; 2012   2003: + esophagitis; negative H.pylori serology  2012: Dr. Oneida Alar, mild gastritis, Bravo PH probe placement, negative H.pylori  . GERD (gastroesophageal reflux disease)   . Hepatic steatosis   . History of hiatal hernia   . Hyperlipemia   . Hypertension   . Mild CAD    a. Cardiac cath 07/2015 showed 65% distal Cx, 20% mid-distal LAD, 20% prox-distal RCA, EF 60%, EDP 30mmHg.  [name] Overweight   . Panic attacks   . Paroxysmal atrial fibrillation (HCC)   . Stroke [facility])    mucsle in left side of face dosent work as its  supposed to-only deficit.  [name] TIA (transient ischemic attack)    after cath 1/ 17  . Tobacco abuse    1/2 pack per day  . Type II diabetes mellitus ([place])     Past Surgical History:  Procedure Laterality Date  . BRAVO [place] STUDY  05/03/2011   [id number]:RFFM gastritis/normal esophagus and duodenum  . CARDIAC CATHETERIZATION  1990s X 1; 2005; 08/12/2015  . CARDIAC CATHETERIZATION N/A 08/12/2015   Procedure: Left Heart Cath and Coronary Angiography;  Surgeon: Belva Crome, MD; LAD 20%, CFX 65%, RCA 20%, EF 60%   . COLONOSCOPY  1990  . COLONOSCOPY WITH PROPOFOL N/A 11/21/2016   Dr. Oneida Alar: non-thrombosed external hemorrhoids, one 6 mm polyp (polypoid lesion), internal hemorrhoids. TI Normal. 10 years screening  . ESOPHAGOGASTRODUODENOSCOPY  05/03/2011   [id number] gastritis  . NECK MASS EXCISION Right    "done in dr's office; behind right ear/side of ncek"  . POLYPECTOMY  11/21/2016   Procedure: POLYPECTOMY;  Surgeon: [name], MD;  Location: [facility] END[facility] SU[facility];  Service: Endoscopy;;  descending colon polyp  . SHOULDER ARTHROSCOPY W/ ROTATOR CUFF REPAIR Right 2006   acromioclavicular joint arthrosis    Current Outpatient Medications  Medication Sig Dispense Refill  . acetaminophen (TYLENOL) 325 MG tablet Take 650 mg by mouth every 6 (six) hours as needed for pain or fever.     . ALPRAZolam (XANAX) 0.5  MG tablet Take one tablet by mouth three times a day and two tablets at night 150 tablet 2  . amLODipine (NORVASC) 10 MG tablet Take 1 tablet (10 mg total) by mouth daily. 90 tablet 3  . budesonide-formoterol (SYMBICORT) 80-4.5 MCG/ACT inhaler Inhale 2 puffs into the lungs 2 (two) times daily as needed.     . calcium carbonate (TUMS - DOSED IN MG ELEMENTAL CALCIUM) 500 MG chewable tablet Chew 1 tablet by mouth daily as needed for indigestion or heartburn.    . cyclobenzaprine (FLEXERIL) 10 MG tablet Take 1 tablet (10 mg total) by mouth 3 (three) times daily. (Patient taking differently: Take 10 mg by  mouth 3 (three) times daily as needed for muscle spasms. ) 21 tablet 0  . esomeprazole (NEXIUM) 20 MG capsule Take 20 mg by mouth every morning.     [name] HYDROcodone-acetaminophen (NORCO/VICODIN) 5-325 MG tablet Take one to two tablets every 4-6 hours as needed for pain 40 tablet 0  . JANUVIA 100 MG tablet TAKE 1 Tablet BY MOUTH ONCE DAILY 90 tablet 1  . metFORMIN (GLUCOPHAGE) 500 MG tablet Take 750 mg by mouth daily with breakfast.  1 tablet 0  . metoprolol succinate (TOPROL-XL) 50 MG 24 hr tablet TAKE 1 AND A HALF TABLETS BY MOUTH EVERY DAY 135 tablet 3  . nitroGLYCERIN (NITROSTAT) 0.4 MG SL tablet Place 1 tablet (0.4 mg total) under the tongue every 5 (five) minutes x 3 doses as needed for chest pain (if no relief after 3rd dose, proceed to the ED for an evaluation). 25 tablet 3  . PROVENTIL HFA 108 (90 Base) MCG/ACT inhaler INHALE 2 PUFFS BY MOUTH EVERY 6 HOURS AS NEEDED FOR WHEEZING OR SHORTNESS OF BREATH 20.1 g 1  . simvastatin (ZOCOR) 20 MG tablet TAKE 1 TABLET BY MOUTH ONCE DAILY AT BEDTIME 30 tablet 4  . XARELTO 20 MG TABS tablet TAKE 1 Tablet BY MOUTH ONCE DAILY WITH SUPPER 90 tablet 3   No current facility-administered medications for this visit.     Allergies:   Dexilant [dexlansoprazole], Mushroom ext cmplx(shiitake-reishi-mait), Penicillins, and Doxycycline   Social History:  The patient  reports that he has been smoking cigarettes. He started smoking about 36 years ago. He has a 6.25 pack-year smoking history. He has never used smokeless tobacco. He reports that he does not drink alcohol or use drugs.   Family History:  The patient's  family history includes ADD / ADHD in his daughter; Alcohol abuse in his father and mother; Anxiety disorder in his sister and sister; Dementia in his cousin and paternal uncle; Depression in his sister; Diabetes in his brother and father; Heart attack (age of onset: 53) in his brother; Heart attack (age of onset: 44) in his father; Hypertension in his  brother, brother, and brother; Lung cancer in his mother; Seizures in his brother.   ROS:  Please see the history of present illness.   All other systems are personally reviewed and negative.    Exam:    Vital Signs:  There were no vitals taken for this visit.  Well sounding    Labs/Other Tests and Data Reviewed:    Recent Labs: 04/04/2018: Hemoglobin 16.7; Platelets 187 05/31/2018: B Natriuretic Peptide 24.0 01/20/2019: ALT 17; BUN 14; Creatinine, Ser 0.96; Potassium 3.5; Sodium 138   Wt Readings from Last 3 Encounters:  11/08/18 202 lb (91.6 kg)  09/10/18 206 lb (93.4 kg)  08/08/18 211 lb (95.7 kg)  ASSESSMENT & PLAN:    1.  Paroxysmal atrial fibrillation and typical atrial flutter Continue Xarelto for CHADS2VASC of 4 Lifestyle triggers reviewed He is working on applying for insurance.  Could consider ablation in the future   2.  HTN Stable No change required today   Follow-up:  AF clinic in 6 months    Patient Risk:  after full review of this patients clinical status, I feel that they are at moderate risk at this time.  Today, I have spent 15 minutes with the patient with telehealth technology discussing arrhythmia management .    Army Fossa, MD  02/14/2019 9:42 AM     [address] [place]  [id number] [phone] (office) [phone] (fax)

## 2019-03-04 ENCOUNTER — Other Ambulatory Visit: Payer: Self-pay | Admitting: Physician Assistant

## 2019-03-18 ENCOUNTER — Encounter: Payer: Self-pay | Admitting: Physician Assistant

## 2019-03-18 ENCOUNTER — Ambulatory Visit: Payer: Self-pay | Admitting: Physician Assistant

## 2019-03-18 ENCOUNTER — Ambulatory Visit: Payer: Self-pay | Admitting: Gastroenterology

## 2019-03-18 DIAGNOSIS — F17200 Nicotine dependence, unspecified, uncomplicated: Secondary | ICD-10-CM

## 2019-03-18 DIAGNOSIS — J449 Chronic obstructive pulmonary disease, unspecified: Secondary | ICD-10-CM

## 2019-03-18 DIAGNOSIS — I10 Essential (primary) hypertension: Secondary | ICD-10-CM

## 2019-03-18 DIAGNOSIS — Z7901 Long term (current) use of anticoagulants: Secondary | ICD-10-CM

## 2019-03-18 DIAGNOSIS — F39 Unspecified mood [affective] disorder: Secondary | ICD-10-CM

## 2019-03-18 DIAGNOSIS — E785 Hyperlipidemia, unspecified: Secondary | ICD-10-CM

## 2019-03-18 DIAGNOSIS — E118 Type 2 diabetes mellitus with unspecified complications: Secondary | ICD-10-CM

## 2019-03-18 NOTE — Progress Notes (Signed)
There were no vitals taken for this visit.   Subjective:    Patient ID: Richard [name], male    DOB: [birth year], 53 y.o.   MRN: [id number]  HPI: Richard [name] is a 53 y.o. male presenting on 03/18/2019 for No chief complaint on file.   HPI  This is a telemedicine appointment due to coronavirus pandemic.   It is via Telephone as pt does not have a video enabled device.    I connected with  Richard [name] on 03/18/19 by a video enabled telemedicine application and verified that I am speaking with the correct person using two identifiers.   I discussed the limitations of evaluation and management by telemedicine. The patient expressed understanding and agreed to proceed.  Pt is at home.  Provider is at office   He says he took his metformin bid for a little while but then went back to taking it only once daily.    He is still seeing [name] for mental health issues  He is not yet been scheduled for orthopedic surgery.   He is being seen for his elbow.  He has follow up with GI in noember  He is on recall with cardiology for december  He says breathing is okay.  He says he wheezes when lays down at night.  He is still smoking.  He says he is trying to walk more to increase his exercise.         Relevant past medical, surgical, family and social history reviewed and updated as indicated. Interim medical history since our last visit reviewed. Allergies and medications reviewed and updated.   Current Outpatient Medications:  .  acetaminophen (TYLENOL) 325 MG tablet, Take 650 mg by mouth every 6 (six) hours as needed for pain or fever. , Disp: , Rfl:  .  ALPRAZolam (XANAX) 0.5 MG tablet, Take one tablet by mouth three times a day and two tablets at night, Disp: 150 tablet, Rfl: 2 .  amLODipine (NORVASC) 10 MG tablet, Take 1 tablet (10 mg total) by mouth daily., Disp: 90 tablet, Rfl: 3 .  budesonide-formoterol (SYMBICORT) 80-4.5 MCG/ACT inhaler, Inhale 2 puffs into the lungs 2  (two) times daily as needed. , Disp: , Rfl:  .  calcium carbonate (TUMS - DOSED IN MG ELEMENTAL CALCIUM) 500 MG chewable tablet, Chew 1 tablet by mouth daily as needed for indigestion or heartburn., Disp: , Rfl:  .  cyclobenzaprine (FLEXERIL) 10 MG tablet, Take 1 tablet (10 mg total) by mouth 3 (three) times daily. (Patient taking differently: Take 10 mg by mouth 3 (three) times daily as needed for muscle spasms. ), Disp: 21 tablet, Rfl: 0 .  esomeprazole (NEXIUM) 20 MG capsule, Take 20 mg by mouth every morning. , Disp: , Rfl:  .  HYDROcodone-acetaminophen (NORCO/VICODIN) 5-325 MG tablet, Take one to two tablets every 4-6 hours as needed for pain, Disp: 40 tablet, Rfl: 0 .  JANUVIA 100 MG tablet, TAKE 1 Tablet BY MOUTH ONCE DAILY, Disp: 90 tablet, Rfl: 1 .  metFORMIN (GLUCOPHAGE) 500 MG tablet, Take by mouth daily with breakfast., Disp: , Rfl:  .  metoprolol succinate (TOPROL-XL) 50 MG 24 hr tablet, TAKE 1 AND A HALF TABLETS BY MOUTH EVERY DAY, Disp: 135 tablet, Rfl: 3 .  nitroGLYCERIN (NITROSTAT) 0.4 MG SL tablet, Place 1 tablet (0.4 mg total) under the tongue every 5 (five) minutes x 3 doses as needed for chest pain (if no relief after 3rd dose, proceed to  the ED for an evaluation)., Disp: 25 tablet, Rfl: 3 .  PROVENTIL HFA 108 (90 Base) MCG/ACT inhaler, INHALE 2 PUFFS BY MOUTH EVERY 6 HOURS AS NEEDED FOR WHEEZING OR SHORTNESS OF BREATH, Disp: 20.1 g, Rfl: 1 .  simvastatin (ZOCOR) 20 MG tablet, TAKE 1 TABLET BY MOUTH ONCE DAILY AT BEDTIME, Disp: 30 tablet, Rfl: 0 .  XARELTO 20 MG TABS tablet, TAKE 1 Tablet BY MOUTH ONCE DAILY WITH SUPPER, Disp: 90 tablet, Rfl: 3    Review of Systems  Per HPI unless specifically indicated above     Objective:    There were no vitals taken for this visit.  Wt Readings from Last 3 Encounters:  11/08/18 202 lb (91.6 kg)  09/10/18 206 lb (93.4 kg)  08/08/18 211 lb (95.7 kg)    Physical Exam Pulmonary:     Effort: No respiratory distress.  Neurological:      Mental Status: He is alert and oriented to person, place, and time.  Psychiatric:        Attention and Perception: Attention normal.        Speech: Speech normal.        Behavior: Behavior is cooperative.     Results for orders placed or performed during the hospital encounter of 01/20/19  Microalbumin, urine  Result Value Ref Range   Microalb, Ur 6.4 (H) Not Estab. ug/mL  PSA  Result Value Ref Range   Prostatic Specific Antigen 0.34 0.00 - 4.00 ng/mL  Lipid panel  Result Value Ref Range   Cholesterol 127 0 - 200 mg/dL   Triglycerides 174 (H) <150 mg/dL   HDL 30 (L) >40 mg/dL   Total CHOL/HDL Ratio 4.2 RATIO   VLDL 35 0 - 40 mg/dL   LDL Cholesterol 62 0 - 99 mg/dL  Hemoglobin A1c  Result Value Ref Range   Hgb A1c MFr Bld 7.0 (H) 4.8 - 5.6 %   Mean Plasma Glucose 154.2 mg/dL  Comprehensive metabolic panel  Result Value Ref Range   Sodium 138 135 - 145 mmol/L   Potassium 3.5 3.5 - 5.1 mmol/L   Chloride 102 98 - 111 mmol/L   CO2 26 22 - 32 mmol/L   Glucose, Bld 162 (H) 70 - 99 mg/dL   BUN 14 6 - 20 mg/dL   Creatinine, Ser 0.96 0.61 - 1.24 mg/dL   Calcium 9.0 8.9 - 10.3 mg/dL   Total Protein 7.0 6.5 - 8.1 g/dL   Albumin 4.0 3.5 - 5.0 g/dL   AST 14 (L) 15 - 41 U/L   ALT 17 0 - 44 U/L   Alkaline Phosphatase 54 38 - 126 U/L   Total Bilirubin 0.3 0.3 - 1.2 mg/dL   GFR calc non Af Amer >60 >60 mL/min   GFR calc Af Amer >60 >60 mL/min   Anion gap 10 5 - 15      Assessment & Plan:    Encounter Diagnoses  Name Primary?  . Controlled diabetes mellitus type 2 with complications, unspecified whether long term insulin use ([place]) Yes  . Essential hypertension   . Hyperlipidemia, unspecified hyperlipidemia type   . Tobacco use disorder   . Chronic obstructive pulmonary disease, unspecified COPD type ([place])   . Mood disorder ([place])   . Anticoagulant long-term use      -Labs not due to be drawn at this time (due to pt delay in getting last labs drawn after previous  appointment)  -Pt to call orthopedist for follow up  -Pt  says unable to wear a mask longer than 10 minutes due to mental health issues.  Will not refer for dm eye exam due to this.   Pt states understanding  -counseled Smoking cessation  -pt to Continue current rx  -pt to follow up early November.  He is to contact office sooner prn

## 2019-03-27 ENCOUNTER — Other Ambulatory Visit: Payer: Self-pay

## 2019-03-27 ENCOUNTER — Ambulatory Visit (INDEPENDENT_AMBULATORY_CARE_PROVIDER_SITE_OTHER): Payer: Self-pay | Admitting: Orthopaedic Surgery

## 2019-03-27 ENCOUNTER — Encounter: Payer: Self-pay | Admitting: Orthopaedic Surgery

## 2019-03-27 DIAGNOSIS — M7712 Lateral epicondylitis, left elbow: Secondary | ICD-10-CM

## 2019-03-27 MED ORDER — BUPIVACAINE HCL 0.5 % IJ SOLN
1.0000 mL | INTRAMUSCULAR | Status: AC | PRN
  Administered 2019-03-27: 14:00:00 1 mL via INTRA_ARTICULAR

## 2019-03-27 MED ORDER — METHYLPREDNISOLONE ACETATE 40 MG/ML IJ SUSP
40.0000 mg | INTRAMUSCULAR | Status: AC | PRN
  Administered 2019-03-27: 14:00:00 40 mg via INTRA_ARTICULAR

## 2019-03-27 MED ORDER — LIDOCAINE HCL 1 % IJ SOLN
0.5000 mL | INTRAMUSCULAR | Status: AC | PRN
  Administered 2019-03-27: .5 mL

## 2019-03-27 NOTE — Progress Notes (Signed)
Office Visit Note   Patient: Richard [name]           Date of Birth: 1966-05-05           MRN: [id number] Visit Date: 03/27/2019              Requested by: [name], PA-C [address] [place],  [place] [address] PCP: [name], PA-C   Assessment & Plan: Visit Diagnoses:  1. Lateral epicondylitis, left elbow     Plan: Patient is worn out his old tennis elbow brace knee brace given.  We discussed with him he is having chronic tendinitis problems repetitive grabbing squeezing pulling on starter Roche for the weedeater's etc.  He is overloading the tendons and needs to rest and avoid activities are aggravating it.  We discussed possible surgical intervention for his right elbow however if he continues to to grabbing pulling squeezing is likely a recurrent problems.  His wife is present today and she states this is exactly what she has been trying to tell him but he has not been listening.  Tennis elbow injection performed left elbow.  Brace used on the right or left elbow he can swap it as needed.  Return PRN.  Follow-Up Instructions: No follow-ups on file.   Orders:  Orders Placed This Encounter  Procedures  . Medium Joint Inj   No orders of the defined types were placed in this encounter.     Procedures: Medium Joint Inj: L lateral epicondyle on 03/27/2019 2:20 PM Indications: pain Details: 22 G 1.5 in needle, anterolateral approach Medications: 1 mL bupivacaine 0.5 %; 40 mg methylPREDNISolone acetate 40 MG/ML; 0.5 mL lidocaine 1 % Outcome: tolerated well, no immediate complications Procedure, treatment alternatives, risks and benefits explained, specific risks discussed. Consent was given by the patient. Immediately prior to procedure a time out was called to verify the correct patient, procedure, equipment, support staff and site/side marked as required. Patient was prepped and draped in the usual sterile fashion.       Clinical Data: No additional findings.    Subjective: Chief Complaint  Patient presents with  . Right Elbow - Pain  . Left Elbow - Pain    HPI 53 year old male returns with chronic right lateral epicondylitis.  Is been injected x3 his diabetes is better and finally his A1c is 7 after being elevated greater than 8.5.  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  He also does some weed eating and this is hurting both elbows.  He denies numbness or tingling in his hands no associated neck pain.  Review of Systems 14 point review of systems updated and unchanged from last office visit.  Objective: Vital Signs: BP 140/84   Pulse (!) 55   Ht 5\' 11"  (1.803 m)   Wt 197 lb (89.4 kg)   BMI 27.48 kg/m   Physical Exam Constitutional:      Appearance: He is well-developed.  HENT:     Head: Normocephalic and atraumatic.  Eyes:     Pupils: Pupils are equal, round, and reactive to light.  Neck:     Thyroid: No thyromegaly.     Trachea: No tracheal deviation.  Cardiovascular:     Rate and Rhythm: Normal rate.  Pulmonary:     Effort: Pulmonary effort is normal.     Breath sounds: No wheezing.  Abdominal:     General: Bowel sounds are normal.     Palpations: Abdomen is soft.  Skin:    General: Skin is warm and dry.     Capillary Refill: Capillary refill takes less than 2 seconds.  Neurological:     Mental Status: He is alert and oriented to person, place, and time.  Psychiatric:        Behavior: Behavior normal.        Thought Content: Thought content normal.        Judgment: Judgment normal.     Ortho Exam bilateral lateral epicondylar tenderness pain with resisted wrist extension negative impingement of the shoulders normal heel toe gait. Specialty Comments:  No specialty comments available.  Imaging: No results found.   PMFS History: Patient Active Problem List   Diagnosis Date  Noted  . Lateral epicondylitis, left elbow 03/27/2019  . Globus sensation 02/21/2018  . Other cervical disc degeneration, unspecified cervical region 01/11/2018  . Tendinitis of right triceps 01/11/2018  . Neck pain 12/27/2017  . Lateral epicondylitis, right elbow 05/10/2017  . Constipation 12/24/2015  . Dyspnea 11/17/2015  . Coronary artery disease due to lipid rich plaque   . Heart palpitations 08/12/2015  . TIA (transient ischemic attack) 08/12/2015  . Colon cancer screening 08/02/2015  . Abdominal pain 12/18/2014  . Encounter for screening colonoscopy 12/18/2014  . Unspecified vitamin D deficiency 08/20/2012  . Arteriosclerotic cardiovascular disease (ASCVD) 04/11/2012  . Chronic low back pain   . Essential hypertension   . Anxiety and depression   . DIABETES MELLITUS-TYPE II 10/03/2010  . Cigarette smoker 09/14/2010  . CHRONIC OBSTRUCTIVE PULMONARY DISEASE 09/14/2010  . HLD (hyperlipidemia) 11/25/2009  . GASTROESOPHAGEAL REFLUX DISEASE 04/05/2009  . Hepatic steatosis 04/05/2009   Past Medical History:  Diagnosis Date  . Arthritis    "legs, spine, shoulders" (08/12/2015)  . Asthma   . Colitis 1990  . COPD (chronic obstructive pulmonary disease) ([place])   . Depression   . Gastric ulcer 2003; 2012   2003: + esophagitis; negative H.pylori serology  2012: Dr. Oneida Alar, mild gastritis, Bravo PH probe placement, negative H.pylori  . GERD (gastroesophageal reflux disease)   . Hepatic steatosis   . History of hiatal hernia   . Hyperlipemia   . Hypertension   . Mild CAD    a. Cardiac cath 07/2015 showed 65% distal Cx, 20% mid-distal LAD, 20% prox-distal RCA, EF 60%, EDP 38mmHg.  [name] Overweight   . Panic attacks   . Paroxysmal atrial fibrillation (HCC)   . Stroke [facility])    mucsle in left side of face dosent work as its supposed to-only deficit.  [name] TIA (transient ischemic attack)    after cath 1/ 17  . Tobacco abuse    1/2 pack per day  . Type II diabetes mellitus (HCC)      Family History  Problem Relation Age of Onset  . Lung cancer Mother   . Alcohol abuse Mother   . Heart attack Father 43  . Diabetes Father   . Alcohol abuse Father   . Hypertension Brother   . Hypertension Brother   . Anxiety disorder Sister   . Depression Sister   . Anxiety disorder Sister   . Heart attack Brother 36  . Diabetes Brother   . Hypertension Brother   . Seizures Brother   . Dementia Paternal Uncle   . Dementia Cousin   . ADD / ADHD Daughter   . Colon cancer Neg Hx   .  Drug abuse Neg Hx   . Bipolar disorder Neg Hx   . OCD Neg Hx   . Paranoid behavior Neg Hx   . Schizophrenia Neg Hx   . Sexual abuse Neg Hx   . Physical abuse Neg Hx     Past Surgical History:  Procedure Laterality Date  . BRAVO [place] STUDY  05/03/2011   [id number] gastritis/normal esophagus and duodenum  . CARDIAC CATHETERIZATION  1990s X 1; 2005; 08/12/2015  . CARDIAC CATHETERIZATION N/A 08/12/2015   Procedure: Left Heart Cath and Coronary Angiography;  Surgeon: Belva Crome, MD; LAD 20%, CFX 65%, RCA 20%, EF 60%   . COLONOSCOPY  1990  . COLONOSCOPY WITH PROPOFOL N/A 11/21/2016   Dr. Oneida Alar: non-thrombosed external hemorrhoids, one 6 mm polyp (polypoid lesion), internal hemorrhoids. TI Normal. 10 years screening  . ESOPHAGOGASTRODUODENOSCOPY  05/03/2011   [id number] gastritis  . NECK MASS EXCISION Right    "done in dr's office; behind right ear/side of ncek"  . POLYPECTOMY  11/21/2016   Procedure: POLYPECTOMY;  Surgeon: [name], MD;  Location[facility];  Service: Endoscopy;;  descending colon polyp  . SHOULDER ARTHROSCOPY W/ ROTATOR CUFF REPAIR Right 2006   acromioclavicular joint arthrosis   Social History   Occupational History  . Occupation: full time    Employer: UNEMPLOYED  Tobacco Use  . Smoking status: Current Every Day Smoker    Packs/day: 0.25    Years: 25.00    Pack years: 6.25    Types: Cigarettes    Start date: 07/17/1982  . Smokeless tobacco: Never Used  Substance  and Sexual Activity  . Alcohol use: No    Alcohol/week: 0.0 standard drinks  . Drug use: No  . Sexual activity: Yes    Birth control/protection: None

## 2019-03-28 ENCOUNTER — Encounter (HOSPITAL_COMMUNITY): Payer: Self-pay

## 2019-03-28 ENCOUNTER — Emergency Department (HOSPITAL_COMMUNITY): Payer: Self-pay

## 2019-03-28 ENCOUNTER — Telehealth: Payer: Self-pay | Admitting: Orthopaedic Surgery

## 2019-03-28 ENCOUNTER — Other Ambulatory Visit: Payer: Self-pay

## 2019-03-28 ENCOUNTER — Emergency Department (HOSPITAL_COMMUNITY)
Admission: EM | Admit: 2019-03-28 | Discharge: 2019-03-29 | Disposition: A | Discharge status: Home / Self Care | Payer: Self-pay | Attending: Emergency Medicine | Admitting: Emergency Medicine

## 2019-03-28 DIAGNOSIS — Z8673 Personal history of transient ischemic attack (TIA), and cerebral infarction without residual deficits: Secondary | ICD-10-CM | POA: Insufficient documentation

## 2019-03-28 DIAGNOSIS — I10 Essential (primary) hypertension: Secondary | ICD-10-CM | POA: Insufficient documentation

## 2019-03-28 DIAGNOSIS — Z7984 Long term (current) use of oral hypoglycemic drugs: Secondary | ICD-10-CM | POA: Insufficient documentation

## 2019-03-28 DIAGNOSIS — R002 Palpitations: Secondary | ICD-10-CM | POA: Insufficient documentation

## 2019-03-28 DIAGNOSIS — J45909 Unspecified asthma, uncomplicated: Secondary | ICD-10-CM | POA: Insufficient documentation

## 2019-03-28 DIAGNOSIS — E119 Type 2 diabetes mellitus without complications: Secondary | ICD-10-CM | POA: Insufficient documentation

## 2019-03-28 DIAGNOSIS — J449 Chronic obstructive pulmonary disease, unspecified: Secondary | ICD-10-CM | POA: Insufficient documentation

## 2019-03-28 DIAGNOSIS — Z79899 Other long term (current) drug therapy: Secondary | ICD-10-CM | POA: Insufficient documentation

## 2019-03-28 DIAGNOSIS — Z7901 Long term (current) use of anticoagulants: Secondary | ICD-10-CM | POA: Insufficient documentation

## 2019-03-28 DIAGNOSIS — R079 Chest pain, unspecified: Secondary | ICD-10-CM | POA: Insufficient documentation

## 2019-03-28 DIAGNOSIS — F17210 Nicotine dependence, cigarettes, uncomplicated: Secondary | ICD-10-CM | POA: Insufficient documentation

## 2019-03-28 MED ORDER — SODIUM CHLORIDE 0.9% FLUSH: 3.0000 mL | Freq: Once | INTRAVENOUS | Status: DC

## 2019-03-28 NOTE — Telephone Encounter (Signed)
Please advise 

## 2019-03-28 NOTE — Telephone Encounter (Signed)
Patient's wife called. He got an injection yesterday in the [place] office. He is in a lot of pain today. Would like to know what to do or take. His call back number is [phone]

## 2019-03-28 NOTE — ED Provider Notes (Signed)
[facility] EMERGENCY DEPARTMENT Provider Note   CSN: [id number] Arrival date & time: 03/28/19  2302   Time seen 12:46 AM  History   Chief Complaint Chief Complaint  Patient presents with   Chest Pain    HPI Richard [name] is a 53 y.o. male.     HPI patient states he has a history of atrial fib.  He states is been a while since he had an episode.  He states on Thursday, September 10 he had a steroid injection in his left elbow.  Tonight around 10 PM he was watching a westerns on TV and had acute onset of a sharp pain that radiated from his left to his right side with diaphoresis.  He states the room got dark and he felt like his heart was racing like he has had before with atrial fib.  He states he normally takes a Tums which helps his atrial fib however it did not help tonight.  He states that heart racing lasted about an hour and he was still having chest pain when he arrived in the ED.  He states now he just has some mild pressure in the center of his chest.  He states he is never had chest pain with the atrial for before.  He does report he has a hiatal hernia and wife states that he ate fried chicken tonight.  Patient states he is on Xarelto although he missed his 11 PM dose tonight because he came to the ED.  He has had a cardiac cath about 3 years ago when he was told he had a 50 and a 40% blockage in the lower portion of the heart.  PCP [name], PA-C Cardiology Dr Jacinta Shoe  Past Medical History:  Diagnosis Date   Arthritis    "legs, spine, shoulders" (08/12/2015)   Asthma    Colitis 1990   COPD (chronic obstructive pulmonary disease) ([place])    Depression    Gastric ulcer 2003; 2012   2003: + esophagitis; negative H.pylori serology  2012: Dr. Oneida Alar, mild gastritis, Bravo PH probe placement, negative H.pylori   GERD (gastroesophageal reflux disease)    Hepatic steatosis    History of hiatal hernia    Hyperlipemia    Hypertension    Mild CAD    a.  Cardiac cath 07/2015 showed 65% distal Cx, 20% mid-distal LAD, 20% prox-distal RCA, EF 60%, EDP 101mmHg.   Overweight    Panic attacks    Paroxysmal atrial fibrillation (HCC)    Stroke ([place])    mucsle in left side of face dosent work as its supposed to-only deficit.   TIA (transient ischemic attack)    after cath 1/ 17   Tobacco abuse    1/2 pack per day   Type II diabetes mellitus [facility])     Patient Active Problem List   Diagnosis Date Noted   Lateral epicondylitis, left elbow 03/27/2019   Globus sensation 02/21/2018   Other cervical disc degeneration, unspecified cervical region 01/11/2018   Tendinitis of right triceps 01/11/2018   Neck pain 12/27/2017   Lateral epicondylitis, right elbow 05/10/2017   Constipation 12/24/2015   Dyspnea 11/17/2015   Coronary artery disease due to lipid rich plaque    Heart palpitations 08/12/2015   TIA (transient ischemic attack) 08/12/2015   Colon cancer screening 08/02/2015   Abdominal pain 12/18/2014   Encounter for screening colonoscopy 12/18/2014   Unspecified vitamin D deficiency 08/20/2012   Arteriosclerotic cardiovascular disease (ASCVD) 04/11/2012   Chronic  low back pain    Essential hypertension    Anxiety and depression    DIABETES MELLITUS-TYPE II 10/03/2010   Cigarette smoker 09/14/2010   CHRONIC OBSTRUCTIVE PULMONARY DISEASE 09/14/2010   HLD (hyperlipidemia) 11/25/2009   GASTROESOPHAGEAL REFLUX DISEASE 04/05/2009   Hepatic steatosis 04/05/2009    Past Surgical History:  Procedure Laterality Date   BRAVO [facility] STUDY  05/03/2011   [id number] gastritis/normal esophagus and duodenum   CARDIAC CATHETERIZATION  1990s X 1; 2005; 08/12/2015   CARDIAC CATHETERIZATION N/A 08/12/2015   Procedure: Left Heart Cath and Coronary Angiography;  Surgeon: Belva Crome, MD; LAD 20%, CFX 65%, RCA 20%, EF 60%    COLONOSCOPY  1990   COLONOSCOPY WITH PROPOFOL N/A 11/21/2016   Dr. Oneida Alar: non-thrombosed external  hemorrhoids, one 6 mm polyp (polypoid lesion), internal hemorrhoids. TI Normal. 10 years screening   ESOPHAGOGASTRODUODENOSCOPY  05/03/2011   [id number] gastritis   NECK MASS EXCISION Right    "done in dr's office; behind right ear/side of ncek"   POLYPECTOMY  11/21/2016   Procedure: POLYPECTOMY;  Surgeon: [name], MD;  Location: [facility] ENDO SUITE;  Service: Endoscopy;;  descending colon polyp   SHOULDER ARTHROSCOPY W/ ROTATOR CUFF REPAIR Right 2006   acromioclavicular joint arthrosis        Home Medications    Prior to Admission medications   Medication Sig Start Date End Date Taking? Authorizing Provider  acetaminophen (TYLENOL) 325 MG tablet Take 650 mg by mouth every 6 (six) hours as needed for pain or fever.     [provider]  ALPRAZolam Duanne Moron) 0.5 MG tablet Take one tablet by mouth three times a day and two tablets at night 11/14/18   Cloria Spring, MD  amLODipine (NORVASC) 10 MG tablet Take 1 tablet (10 mg total) by mouth daily. 10/14/18 03/27/19  Herminio Commons, MD  budesonide-formoterol (SYMBICORT) 80-4.5 MCG/ACT inhaler Inhale 2 puffs into the lungs 2 (two) times daily as needed.     [provider]  calcium carbonate (TUMS - DOSED IN MG ELEMENTAL CALCIUM) 500 MG chewable tablet Chew 1 tablet by mouth daily as needed for indigestion or heartburn.    [provider]  cyclobenzaprine (FLEXERIL) 10 MG tablet Take 1 tablet (10 mg total) by mouth 3 (three) times daily. Patient taking differently: Take 10 mg by mouth 3 (three) times daily as needed for muscle spasms.  10/07/15   Lily Kocher, PA-C  esomeprazole (NEXIUM) 20 MG capsule Take 20 mg by mouth every morning.     [provider]  HYDROcodone-acetaminophen (NORCO/VICODIN) 5-325 MG tablet Take one to two tablets every 4-6 hours as needed for pain 01/04/17   Marybelle Killings, MD  JANUVIA 100 MG tablet TAKE 1 Tablet BY MOUTH ONCE DAILY 01/27/19   [name], PA-C  metFORMIN  (GLUCOPHAGE) 500 MG tablet Take by mouth daily with breakfast.    [provider]  metoprolol succinate (TOPROL-XL) 50 MG 24 hr tablet TAKE 1 AND A HALF TABLETS BY MOUTH EVERY DAY 10/25/18   Herminio Commons, MD  nitroGLYCERIN (NITROSTAT) 0.4 MG SL tablet Place 1 tablet (0.4 mg total) under the tongue every 5 (five) minutes x 3 doses as needed for chest pain (if no relief after 3rd dose, proceed to the ED for an evaluation). Patient not taking: Reported on 03/27/2019 05/14/18   Herminio Commons, MD  PROVENTIL HFA 108 [phone] Base) MCG/ACT inhaler INHALE 2 PUFFS BY MOUTH EVERY 6 HOURS AS NEEDED FOR WHEEZING  OR SHORTNESS OF BREATH 11/06/18   [name], PA-C  simvastatin (ZOCOR) 20 MG tablet TAKE 1 TABLET BY MOUTH ONCE DAILY AT BEDTIME 03/04/19   [name], PA-C  XARELTO 20 MG TABS tablet TAKE 1 Tablet BY MOUTH ONCE DAILY WITH SUPPER 01/27/19   Herminio Commons, MD    Family History Family History  Problem Relation Age of Onset   Lung cancer Mother    Alcohol abuse Mother    Heart attack Father 33   Diabetes Father    Alcohol abuse Father    Hypertension Brother    Hypertension Brother    Anxiety disorder Sister    Depression Sister    Anxiety disorder Sister    Heart attack Brother 19   Diabetes Brother    Hypertension Brother    Seizures Brother    Dementia Paternal Uncle    Dementia Cousin    ADD / ADHD Daughter    Colon cancer Neg Hx    Drug abuse Neg Hx    Bipolar disorder Neg Hx    OCD Neg Hx    Paranoid behavior Neg Hx    Schizophrenia Neg Hx    Sexual abuse Neg Hx    Physical abuse Neg Hx     Social History Social History   Tobacco Use   Smoking status: Current Every Day Smoker    Packs/day: 0.25    Years: 25.00    Pack years: 6.25    Types: Cigarettes    Start date: 07/17/1982   Smokeless tobacco: Never Used  Substance Use Topics   Alcohol use: No    Alcohol/week: 0.0 standard drinks   Drug use: No  lives  at home Lives with spouse   Allergies   Dexilant [dexlansoprazole], Mushroom ext cmplx(shiitake-reishi-mait), Penicillins, and Doxycycline   Review of Systems Review of Systems  All other systems reviewed and are negative.    Physical Exam Updated Vital Signs BP 127/83    Pulse (!) 49    Resp 20    Ht 5\' 11"  (1.803 m)    Wt 89.8 kg    SpO2 100%    BMI 27.62 kg/m   Physical Exam Vitals signs and nursing note reviewed.  Constitutional:      General: He is not in acute distress.    Appearance: Normal appearance. He is well-developed. He is not ill-appearing or toxic-appearing.  HENT:     Head: Normocephalic and atraumatic.     Right Ear: External ear normal.     Left Ear: External ear normal.     Nose: Nose normal. No mucosal edema or rhinorrhea.     Mouth/Throat:     Mouth: Mucous membranes are moist.     Dentition: No dental abscesses.     Pharynx: Oropharynx is clear. No uvula swelling.  Eyes:     Conjunctiva/sclera: Conjunctivae normal.     Pupils: Pupils are equal, round, and reactive to light.  Neck:     Musculoskeletal: Full passive range of motion without pain, normal range of motion and neck supple.  Cardiovascular:     Rate and Rhythm: Normal rate and regular rhythm.     Heart sounds: Normal heart sounds. No murmur. No friction rub. No gallop.   Pulmonary:     Effort: Pulmonary effort is normal. No respiratory distress.     Breath sounds: Normal breath sounds. No wheezing, rhonchi or rales.  Chest:     Chest wall: No tenderness or crepitus.  Abdominal:  General: Bowel sounds are normal. There is no distension.     Palpations: Abdomen is soft.     Tenderness: There is no abdominal tenderness. There is no guarding or rebound.  Musculoskeletal: Normal range of motion.        General: No tenderness.     Right lower leg: No edema.     Left lower leg: No edema.     Comments: Moves all extremities well.   Skin:    General: Skin is warm and dry.      Coloration: Skin is not pale.     Findings: No erythema or rash.  Neurological:     General: No focal deficit present.     Mental Status: He is alert and oriented to person, place, and time.     Cranial Nerves: No cranial nerve deficit.  Psychiatric:        Mood and Affect: Mood normal. Mood is not anxious.        Speech: Speech normal.        Behavior: Behavior normal.        Thought Content: Thought content normal.      ED Treatments / Results  Labs (all labs ordered are listed, but only abnormal results are displayed) Results for orders placed or performed during the hospital encounter of [id number]  Basic metabolic panel  Result Value Ref Range   Sodium 134 (L) 135 - 145 mmol/L   Potassium 3.6 3.5 - 5.1 mmol/L   Chloride 101 98 - 111 mmol/L   CO2 24 22 - 32 mmol/L   Glucose, Bld 213 (H) 70 - 99 mg/dL   BUN 18 6 - 20 mg/dL   Creatinine, Ser 1.07 0.61 - 1.24 mg/dL   Calcium 8.8 (L) 8.9 - 10.3 mg/dL   GFR calc non Af Amer >60 >60 mL/min   GFR calc Af Amer >60 >60 mL/min   Anion gap 9 5 - 15  CBC  Result Value Ref Range   WBC 7.6 4.0 - 10.5 K/uL   RBC 5.29 4.22 - 5.81 MIL/uL   Hemoglobin 16.2 13.0 - 17.0 g/dL   HCT 47.8 39.0 - 52.0 %   MCV 90.4 80.0 - 100.0 fL   MCH 30.6 26.0 - 34.0 pg   MCHC 33.9 30.0 - 36.0 g/dL   RDW 12.4 11.5 - 15.5 %   Platelets 198 150 - 400 K/uL   nRBC 0.0 0.0 - 0.2 %  Troponin I (High Sensitivity)  Result Value Ref Range   Troponin I (High Sensitivity) 2 <18 ng/L  Troponin I (High Sensitivity)  Result Value Ref Range   Troponin I (High Sensitivity) 5 <18 ng/L   Laboratory interpretation all normal except hyperglycemia    EKG EKG Interpretation  Date/Time:  Friday March 28 2019 23:14:25 EDT Ventricular Rate:  65 PR Interval:    QRS Duration: 96 QT Interval:  386 QTC Calculation: 402 R Axis:   75 Text Interpretation:  Sinus rhythm Minimal ST depression, diffuse leads No significant change since last tracing 13 Sep 2016  Confirmed by Rolland Porter [phone]) on 03/28/2019 11:18:42 PM   Radiology Dg Chest 2 View  Result Date: 03/29/2019 CLINICAL DATA:  Chest pain. EXAM: CHEST - 2 VIEW COMPARISON:  08/12/2015 FINDINGS: The cardiomediastinal contours are normal. Subsegmental atelectasis at the left lung base. Pulmonary vasculature is normal. No consolidation, pleural effusion, or pneumothorax. No acute osseous abnormalities are seen. IMPRESSION: Subsegmental atelectasis at the left lung base. Electronically Signed   By:  Keith Rake M.D.   On: 03/29/2019 00:18    Procedures Procedures (including critical care time)  Medications Ordered in ED Medications  sodium chloride flush (NS) 0.9 % injection 3 mL (3 mLs Intravenous Not Given 03/29/19 0141)  alum & mag hydroxide-simeth (MAALOX/MYLANTA) 200-200-20 MG/5ML suspension 30 mL (30 mLs Oral Given 03/29/19 0138)     Initial Impression / Assessment and Plan / ED Course  I have reviewed the triage vital signs and the nursing notes.  Pertinent labs & imaging results that were available during my care of the patient were reviewed by me and considered in my medical decision making (see chart for details).       Nurse reports when she first put him on the cardiac monitor his heart rate was 143 however before she could do the EKG his heart rate slowed down and he is currently in normal sinus rhythm.  We discussed doing a delta troponin and if that is negative he can go home.  He is on the heart cardiac monitor so if he goes into atrial fib again it will be documented.  Patient's delta troponin is negative.  His heart score is 3.  He was discharged home to follow-up with his cardiologist.  Final Clinical Impressions(s) / ED Diagnoses   Final diagnoses:  Chest pain, unspecified type  Palpitations    ED Discharge Orders    None      Plan discharge  Rolland Porter, MD, Barbette Or, MD 03/29/19 [phone]

## 2019-03-28 NOTE — Telephone Encounter (Signed)
Ice rest do not use his arm.   I  Called.

## 2019-03-28 NOTE — ED Triage Notes (Signed)
Pt with sudden onset of heart racing, diaphoresis while sitting on the couch.  Pt c/o sharp chest pain, given 4 baby aspirin and 1 ntg per ems with relief of pain.

## 2019-03-29 LAB — TROPONIN I (HIGH SENSITIVITY)
Troponin I (High Sensitivity): 2 ng/L (ref <18)
Troponin I (High Sensitivity): 5 ng/L (ref <18)

## 2019-03-29 LAB — BASIC METABOLIC PANEL
Anion gap: 9 (ref 5–15)
BUN: 18 mg/dL (ref 6–20)
CO2: 24 mmol/L (ref 22–32)
Calcium: 8.8 mg/dL — ABNORMAL LOW (ref 8.9–10.3)
Chloride: 101 mmol/L (ref 98–111)
Creatinine, Ser: 1.07 mg/dL (ref 0.61–1.24)
GFR calc Af Amer: >60 mL/min (ref >60)
GFR calc non Af Amer: >60 mL/min (ref >60)
Glucose, Bld: 213 mg/dL — ABNORMAL HIGH (ref 70–99)
Potassium: 3.6 mmol/L (ref 3.5–5.1)
Sodium: 134 mmol/L — ABNORMAL LOW (ref 135–145)

## 2019-03-29 LAB — CBC
HCT: 47.8 % (ref 39.0–52.0)
Hemoglobin: 16.2 g/dL (ref 13.0–17.0)
MCH: 30.6 pg (ref 26.0–34.0)
MCHC: 33.9 g/dL (ref 30.0–36.0)
MCV: 90.4 fL (ref 80.0–100.0)
Platelets: 198 10*3/uL (ref 150–400)
RBC: 5.29 MIL/uL (ref 4.22–5.81)
RDW: 12.4 % (ref 11.5–15.5)
WBC: 7.6 10*3/uL (ref 4.0–10.5)
nRBC: 0 % (ref 0.0–0.2)

## 2019-03-29 MED ORDER — ALUM & MAG HYDROXIDE-SIMETH 200-200-20 MG/5ML PO SUSP
30.0000 mL | Freq: Once | ORAL | Status: AC
  Administered 2019-03-29: 30 mL via ORAL
  Filled 2019-03-29: qty 30

## 2019-03-29 NOTE — Discharge Instructions (Addendum)
From what you describe tonight it sounds like you had another episode of the atrial fibrillation.  Continue taking your Xarelto.  Some of your chest discomfort may be related to your hiatal hernia, look at the information about that.  Please try to avoid fried, spicy, or greasy foods.  Please call Dr. Court Joy office on Monday, September 14 to let him know about your ED visit.  Return to the emergency department if you get worse over the weekend.

## 2019-03-31 ENCOUNTER — Telehealth: Payer: Self-pay | Admitting: Cardiovascular Disease

## 2019-03-31 NOTE — Telephone Encounter (Signed)
Dr. Bronson Ing, please review chart to see if [name] needs a follow up cardiology visit.

## 2019-03-31 NOTE — Telephone Encounter (Signed)
He follows with Dr. Rayann Heman and can follow-up with him.  He does not need to see me.

## 2019-04-09 ENCOUNTER — Telehealth (INDEPENDENT_AMBULATORY_CARE_PROVIDER_SITE_OTHER): Payer: Self-pay | Admitting: Internal Medicine

## 2019-04-09 ENCOUNTER — Other Ambulatory Visit: Payer: Self-pay

## 2019-04-09 ENCOUNTER — Encounter: Payer: Self-pay | Admitting: Internal Medicine

## 2019-04-09 VITALS — Ht 71.0 in | Wt 196.0 lb

## 2019-04-09 DIAGNOSIS — I10 Essential (primary) hypertension: Secondary | ICD-10-CM

## 2019-04-09 DIAGNOSIS — I480 Paroxysmal atrial fibrillation: Secondary | ICD-10-CM

## 2019-04-09 DIAGNOSIS — I2510 Atherosclerotic heart disease of native coronary artery without angina pectoris: Secondary | ICD-10-CM

## 2019-04-09 NOTE — Progress Notes (Signed)
Electrophysiology TeleHealth Note   Due to national recommendations of social distancing due to [place] 19, an audio telehealth visit is felt to be most appropriate for this patient at this time.  Verbal consent was obtained by me for the telehealth visit today.  The patient does not have capability for a Mychart visit.  A phone visit is therefore required today.   Date:  04/09/2019   ID:  Richard [name], DOB [birth year], MRN [id number]  Location: patient's home  Provider location:  [place]  Evaluation Performed: Follow-up visit  PCP:  [name], PA-C   Electrophysiologist:  Dr Rayann Heman  Chief Complaint:  palpitations  History of Present Illness:    Richard [name] is a 53 y.o. male who presents via telehealth conferencing today.  Since last being seen in our clinic, the patient reports doing reasonably well. He recently had afib for which he went to the ED 03/28/2019.  He reports AF episodes ongoing, lasting hours.  He is very symptomatic with these.  + tachypalpitations and chest pain.  This AF event occurred after prednisone injection for elbow pain.  Today, he denies symptoms of shortness of breath,  lower extremity edema, dizziness, presyncope, or syncope.  The patient is otherwise without complaint today.  The patient denies symptoms of fevers, chills, cough, or new SOB worrisome for COVID 19.  Past Medical History:  Diagnosis Date  . Arthritis    "legs, spine, shoulders" (08/12/2015)  . Asthma   . Colitis 1990  . COPD (chronic obstructive pulmonary disease) ([place])   . Depression   . Gastric ulcer 2003; 2012   2003: + esophagitis; negative H.pylori serology  2012: Dr. Oneida Alar, mild gastritis, Bravo PH probe placement, negative H.pylori  . GERD (gastroesophageal reflux disease)   . Hepatic steatosis   . History of hiatal hernia   . Hyperlipemia   . Hypertension   . Mild CAD    a. Cardiac cath 07/2015 showed 65% distal Cx, 20% mid-distal LAD, 20% prox-distal RCA, EF  60%, EDP 75mmHg.  [name] Overweight   . Panic attacks   . Paroxysmal atrial fibrillation (HCC)   . Stroke [facility])    mucsle in left side of face dosent work as its supposed to-only deficit.  [name] TIA (transient ischemic attack)    after cath 1/ 17  . Tobacco abuse    1/2 pack per day  . Type II diabetes mellitus ([place])     Past Surgical History:  Procedure Laterality Date  . BRAVO [place] STUDY  05/03/2011   [id number] gastritis/normal esophagus and duodenum  . CARDIAC CATHETERIZATION  1990s X 1; 2005; 08/12/2015  . CARDIAC CATHETERIZATION N/A 08/12/2015   Procedure: Left Heart Cath and Coronary Angiography;  Surgeon: Belva Crome, MD; LAD 20%, CFX 65%, RCA 20%, EF 60%   . COLONOSCOPY  1990  . COLONOSCOPY WITH PROPOFOL N/A 11/21/2016   Dr. Oneida Alar: non-thrombosed external hemorrhoids, one 6 mm polyp (polypoid lesion), internal hemorrhoids. TI Normal. 10 years screening  . ESOPHAGOGASTRODUODENOSCOPY  05/03/2011   [id number] gastritis  . NECK MASS EXCISION Right    "done in dr's office; behind right ear/side of ncek"  . POLYPECTOMY  11/21/2016   Procedure: POLYPECTOMY;  Surgeon: [name], MD;  Location: [facility] ENDO SUITE;  Service: Endoscopy;;  descending colon polyp  . SHOULDER ARTHROSCOPY W/ ROTATOR CUFF REPAIR Right 2006   acromioclavicular joint arthrosis    Current Outpatient Medications  Medication Sig Dispense Refill  . acetaminophen (TYLENOL)  325 MG tablet Take 650 mg by mouth every 6 (six) hours as needed for pain or fever.     . ALPRAZolam (XANAX) 0.5 MG tablet Take one tablet by mouth three times a day and two tablets at night 150 tablet 2  . budesonide-formoterol (SYMBICORT) 80-4.5 MCG/ACT inhaler Inhale 2 puffs into the lungs 2 (two) times daily as needed.     . calcium carbonate (TUMS - DOSED IN MG ELEMENTAL CALCIUM) 500 MG chewable tablet Chew 1 tablet by mouth daily as needed for indigestion or heartburn.    . cyclobenzaprine (FLEXERIL) 10 MG tablet Take 1 tablet (10 mg total) by mouth  3 (three) times daily. (Patient taking differently: Take 10 mg by mouth 3 (three) times daily as needed for muscle spasms. ) 21 tablet 0  . esomeprazole (NEXIUM) 20 MG capsule Take 20 mg by mouth every morning.     [name] HYDROcodone-acetaminophen (NORCO/VICODIN) 5-325 MG tablet Take one to two tablets every 4-6 hours as needed for pain 40 tablet 0  . JANUVIA 100 MG tablet TAKE 1 Tablet BY MOUTH ONCE DAILY 90 tablet 1  . metFORMIN (GLUCOPHAGE) 500 MG tablet Take by mouth daily with breakfast.    . metoprolol succinate (TOPROL-XL) 50 MG 24 hr tablet TAKE 1 AND A HALF TABLETS BY MOUTH EVERY DAY 135 tablet 3  . nitroGLYCERIN (NITROSTAT) 0.4 MG SL tablet Place 1 tablet (0.4 mg total) under the tongue every 5 (five) minutes x 3 doses as needed for chest pain (if no relief after 3rd dose, proceed to the ED for an evaluation). 25 tablet 3  . PROVENTIL HFA 108 (90 Base) MCG/ACT inhaler INHALE 2 PUFFS BY MOUTH EVERY 6 HOURS AS NEEDED FOR WHEEZING OR SHORTNESS OF BREATH 20.1 g 1  . simvastatin (ZOCOR) 20 MG tablet TAKE 1 TABLET BY MOUTH ONCE DAILY AT BEDTIME 30 tablet 0  . XARELTO 20 MG TABS tablet TAKE 1 Tablet BY MOUTH ONCE DAILY WITH SUPPER 90 tablet 3  . amLODipine (NORVASC) 10 MG tablet Take 1 tablet (10 mg total) by mouth daily. 90 tablet 3   No current facility-administered medications for this visit.     Allergies:   Dexilant [dexlansoprazole], Mushroom ext cmplx(shiitake-reishi-mait), Penicillins, and Doxycycline   Social History:  The patient  reports that he has been smoking cigarettes. He started smoking about 36 years ago. He has a 6.25 pack-year smoking history. He has never used smokeless tobacco. He reports that he does not drink alcohol or use drugs.   Family History:  The patient's family history includes ADD / ADHD in his daughter; Alcohol abuse in his father and mother; Anxiety disorder in his sister and sister; Dementia in his cousin and paternal uncle; Depression in his sister; Diabetes in  his brother and father; Heart attack (age of onset: 57) in his brother; Heart attack (age of onset: 31) in his father; Hypertension in his brother, brother, and brother; Lung cancer in his mother; Seizures in his brother.   ROS:  Please see the history of present illness.   All other systems are personally reviewed and negative.    Exam:    Vital Signs:  Ht 5\' 11"  (1.803 m)   Wt 196 lb (88.9 kg)   BMI 27.34 kg/m   Well sounding, alert and conversant   Labs/Other Tests and Data Reviewed:    Recent Labs: 05/31/2018: B Natriuretic Peptide 24.0 01/20/2019: ALT 17 03/28/2019: BUN 18; Creatinine, Ser 1.07; Hemoglobin 16.2; Platelets 198; Potassium 3.6; Sodium  134   Wt Readings from Last 3 Encounters:  04/09/19 196 lb (88.9 kg)  03/28/19 198 lb (89.8 kg)  03/27/19 197 lb (89.4 kg)     ASSESSMENT & PLAN:    1.  Paroxysmal atrial fibrillation Very symptomatic Did not tolerate flecainide Feels that episodes are better by "watching what (he) eats" On xarelto Continue metoprolol I have encouraged him to get insurance.  Ultimately, ablation would be the best option for him.  2. HTN Stable No change required today  3. CAD (nonobstructive) Followed by Dr Bronson Ing  Follow-up:  AF clinic in 4 months   Patient Risk:  after full review of this patients clinical status, I feel that they are at moderate risk at this time.  Today, I have spent 15 minutes with the patient with telehealth technology discussing arrhythmia management .    Army Fossa, MD  04/09/2019 10:57 AM     [facility] [address] [place] [address] [phone] (office) [phone] (fax)

## 2019-04-10 ENCOUNTER — Other Ambulatory Visit: Payer: Self-pay | Admitting: Physician Assistant

## 2019-04-14 ENCOUNTER — Ambulatory Visit: Payer: Self-pay | Admitting: Physician Assistant

## 2019-05-08 ENCOUNTER — Other Ambulatory Visit: Payer: Self-pay | Admitting: Physician Assistant

## 2019-05-09 ENCOUNTER — Other Ambulatory Visit: Payer: Self-pay | Admitting: Physician Assistant

## 2019-05-20 ENCOUNTER — Ambulatory Visit: Payer: Self-pay | Admitting: Physician Assistant

## 2019-05-22 ENCOUNTER — Ambulatory Visit: Payer: Self-pay | Admitting: Physician Assistant

## 2019-06-02 ENCOUNTER — Ambulatory Visit (HOSPITAL_COMMUNITY): Payer: Self-pay | Admitting: Psychiatry

## 2019-06-02 ENCOUNTER — Other Ambulatory Visit: Payer: Self-pay

## 2019-06-02 ENCOUNTER — Telehealth (HOSPITAL_COMMUNITY): Payer: Self-pay | Admitting: Psychiatry

## 2019-06-04 ENCOUNTER — Encounter: Payer: Self-pay | Admitting: Gastroenterology

## 2019-06-04 ENCOUNTER — Other Ambulatory Visit: Payer: Self-pay

## 2019-06-04 ENCOUNTER — Ambulatory Visit (INDEPENDENT_AMBULATORY_CARE_PROVIDER_SITE_OTHER): Payer: Self-pay | Admitting: Gastroenterology

## 2019-06-04 VITALS — BP 129/79 | HR 53 | Temp 96.2°F | Ht 71.0 in | Wt 196.8 lb

## 2019-06-04 DIAGNOSIS — R0989 Other specified symptoms and signs involving the circulatory and respiratory systems: Secondary | ICD-10-CM

## 2019-06-04 NOTE — Progress Notes (Signed)
Referring Provider: [name], PA-C Primary Care Physician:  [name], PA-C  Primary GI: Dr. Oneida Alar   Chief Complaint  Patient presents with  . Dysphagia    feels like knot in neck when swallows, meds get stuck  . Abdominal Pain    pressure right side    HPI:   [name] is a 53 y.o. male presenting today with a history of chronic GERD, abdominal pain, dysphagia, and constipation. Last seen in Aug 2019. US abdomen in 2017 with fatty liver. HIDA normal with EF 83%. 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   He presents today with persistent globus sensation. PIll dysphagia but without solid food dysphagia. Pressure in right flank intermittently. Unknown aggravating or relieving factors. Does not want to pursue any diagnostic work-up currently due to COVID. Sharp epigastric pain every once in awhile, which does not last long. Quit drinking Mt Dew. Still with carbonation, drinking 1-2 Pepsis a day.        Past Medical History:  Diagnosis Date  . Arthritis    "legs, spine, shoulders" (08/12/2015)  . Asthma   . Colitis 1990  . COPD (chronic obstructive pulmonary disease) ([place])   . Depression   . Gastric ulcer 2003; 2012   2003: + esophagitis; negative H.pylori serology  2012: Dr. Oneida Alar, mild gastritis, Bravo PH probe placement, negative H.pylori  . GERD (gastroesophageal reflux disease)   . Hepatic steatosis   . History of hiatal hernia   . Hyperlipemia   . Hypertension   . Mild CAD    a. Cardiac cath 07/2015 showed 65% distal Cx, 20% mid-distal LAD, 20% prox-distal RCA, EF 60%, EDP 1mmHg.  [name] Overweight   . Panic attacks   . Paroxysmal atrial fibrillation (HCC)   . Stroke [facility])    mucsle in left side of face dosent work as its supposed to-only deficit.  [name] TIA (transient ischemic attack)    after cath 1/ 17  . Tobacco abuse    1/2 pack per day  . Type II diabetes mellitus  ([place])     Past Surgical History:  Procedure Laterality Date  . BRAVO [place] STUDY  05/03/2011   [id number] gastritis/normal esophagus and duodenum  . CARDIAC CATHETERIZATION  1990s X 1; 2005; 08/12/2015  . CARDIAC CATHETERIZATION N/A 08/12/2015   Procedure: Left Heart Cath and Coronary Angiography;  Surgeon: Belva Crome, MD; LAD 20%, CFX 65%, RCA 20%, EF 60%   . COLONOSCOPY  1990  . COLONOSCOPY WITH PROPOFOL N/A 11/21/2016   Dr. Oneida Alar: non-thrombosed external hemorrhoids, one 6 mm polyp (polypoid lesion), internal hemorrhoids. TI Normal. 10 years screening  . ESOPHAGOGASTRODUODENOSCOPY  05/03/2011   [id number] gastritis  . NECK MASS EXCISION Right    "done in dr's office; behind right ear/side of ncek"  . POLYPECTOMY  11/21/2016   Procedure: POLYPECTOMY;  Surgeon: [name], MD;  Location: [facility];  Service: Endoscopy;;  descending colon polyp  . SHOULDER ARTHROSCOPY W/ ROTATOR CUFF REPAIR Right 2006   acromioclavicular joint arthrosis    Current Outpatient Medications  Medication Sig Dispense Refill  . acetaminophen (TYLENOL) 325 MG tablet Take 650 mg by mouth every 6 (six) hours as needed for pain or fever.     . ALPRAZolam (XANAX) 0.5 MG tablet Take one tablet by mouth three times a day and two tablets at night 150 tablet 2  . amLODipine (NORVASC) 10 MG tablet Take 1 tablet (10  mg total) by mouth daily. 90 tablet 3  . budesonide-formoterol (SYMBICORT) 80-4.5 MCG/ACT inhaler Inhale 2 puffs into the lungs 2 (two) times daily as needed.     . calcium carbonate (TUMS - DOSED IN MG ELEMENTAL CALCIUM) 500 MG chewable tablet Chew 1 tablet by mouth daily as needed for indigestion or heartburn.    . cyclobenzaprine (FLEXERIL) 10 MG tablet Take 1 tablet (10 mg total) by mouth 3 (three) times daily. (Patient taking differently: Take 10 mg by mouth 3 (three) times daily as needed for muscle spasms. ) 21 tablet 0  . esomeprazole (NEXIUM) 20 MG capsule Take 20 mg by mouth every morning.     [name]  HYDROcodone-acetaminophen (NORCO/VICODIN) 5-325 MG tablet Take one to two tablets every 4-6 hours as needed for pain 40 tablet 0  . JANUVIA 100 MG tablet TAKE 1 Tablet BY MOUTH ONCE DAILY 90 tablet 1  . metFORMIN (GLUCOPHAGE) 500 MG tablet Take by mouth daily with breakfast.    . metoprolol succinate (TOPROL-XL) 50 MG 24 hr tablet TAKE 1 AND A HALF TABLETS BY MOUTH EVERY DAY 135 tablet 3  . nitroGLYCERIN (NITROSTAT) 0.4 MG SL tablet Place 1 tablet (0.4 mg total) under the tongue every 5 (five) minutes x 3 doses as needed for chest pain (if no relief after 3rd dose, proceed to the ED for an evaluation). 25 tablet 3  . PROVENTIL HFA 108 (90 Base) MCG/ACT inhaler INHALE 2 PUFFS BY MOUTH EVERY 6 HOURS AS NEEDED FOR WHEEZING OR SHORTNESS OF BREATH 20.1 g 1  . simvastatin (ZOCOR) 20 MG tablet TAKE 1 TABLET BY MOUTH ONCE DAILY AT BEDTIME 30 tablet 4  . XARELTO 20 MG TABS tablet TAKE 1 Tablet BY MOUTH ONCE DAILY WITH SUPPER 90 tablet 3   No current facility-administered medications for this visit.     Allergies as of 06/04/2019 - Review Complete 03/28/2019  Allergen Reaction Noted  . Dexilant [dexlansoprazole] Anaphylaxis 01/20/2015  . Mushroom ext cmplx(shiitake-reishi-mait) Anaphylaxis 03/22/2011  . Penicillins Anaphylaxis   . Doxycycline  04/18/2016    Family History  Problem Relation Age of Onset  . Lung cancer Mother   . Alcohol abuse Mother   . Heart attack Father 93  . Diabetes Father   . Alcohol abuse Father   . Hypertension Brother   . Hypertension Brother   . Anxiety disorder Sister   . Depression Sister   . Anxiety disorder Sister   . Heart attack Brother 56  . Diabetes Brother   . Hypertension Brother   . Seizures Brother   . Dementia Paternal Uncle   . Dementia Cousin   . ADD / ADHD Daughter   . Colon cancer Neg Hx   . Drug abuse Neg Hx   . Bipolar disorder Neg Hx   . OCD Neg Hx   . Paranoid behavior Neg Hx   . Schizophrenia Neg Hx   . Sexual abuse Neg Hx   .  Physical abuse Neg Hx     Social History   Socioeconomic History  . Marital status: Married    Spouse name: Not on file  . Number of children: Not on file  . Years of education: Not on file  . Highest education level: Not on file  Occupational History  . Occupation: full time    Employer: UNEMPLOYED  Social Needs  . Financial resource strain: Not on file  . Food insecurity    Worry: Not on file    Inability: Not on  file  . Transportation needs    Medical: Not on file    Non-medical: Not on file  Tobacco Use  . Smoking status: Current Every Day Smoker    Packs/day: 0.25    Years: 25.00    Pack years: 6.25    Types: Cigarettes    Start date: 07/17/1982  . Smokeless tobacco: Never Used  Substance and Sexual Activity  . Alcohol use: No    Alcohol/week: 0.0 standard drinks  . Drug use: No  . Sexual activity: Yes    Birth control/protection: None  Lifestyle  . Physical activity    Days per week: Not on file    Minutes per session: Not on file  . Stress: Not on file  Relationships  . Social Herbalist on phone: Not on file    Gets together: Not on file    Attends religious service: Not on file    Active member of club or organization: Not on file    Attends meetings of clubs or organizations: Not on file    Relationship status: Not on file  Other Topics Concern  . Not on file  Social History Narrative   Pt lives in [place] with wife.  5 children.  Unemployed due to panic attacks and back pain    Review of Systems: Gen: Denies fever, chills, anorexia. Denies fatigue, weakness, weight loss.  CV: Denies chest pain, palpitations, syncope, peripheral edema, and claudication. Resp: Denies dyspnea at rest, cough, wheezing, coughing up blood, and pleurisy. GI: see HPI Derm: Denies rash, itching, dry skin Psych: Denies depression, anxiety, memory loss, confusion. No homicidal or suicidal ideation.  Heme: Denies bruising, bleeding, and enlarged lymph nodes.   Physical Exam: BP 129/79   Pulse (!) 53   Temp (!) 96.2 F (35.7 C) (Temporal)   Ht 5\' 11"  (1.803 m)   Wt 196 lb 12.8 oz (89.3 kg)   BMI 27.45 kg/m  General:   Alert and oriented. No distress noted. Pleasant and cooperative.  Abdomen:  +BS, soft, non-tender and non-distended. No rebound or guarding. No HSM or masses noted. Msk:  Symmetrical without gross deformities. Normal posture. Extremities:  Without edema. Neurologic:  Alert and  oriented x4 Psych:  Alert and cooperative. Normal mood and affect.  ASSESSMENT: Richard [name] is a 53 y.o. male presenting today with history of chronic globus sensation,  likely multifactorial in setting of uncontrolled GERD, dietary intake, and known esophageal dysmotility. Previously recommend manometry, but he is declining this currently due to finances and COVID. Currently taking Nexium once daily.    PLAN:  Increase Nexium to BID GERD and gastritis diet provided Manometry when able to pursue Return in 6 months   Annitta Needs, PhD, [facility]

## 2019-06-04 NOTE — Patient Instructions (Addendum)
I will try to get additional Nexium samples for you. I think it would be helpful to increase the dosage.   I recommend taking pills one at a time. Continue to chew food well. I've included a handout on reflux/gastritis to help with controlling your symptoms.   We will see you in 6 months!   I enjoyed seeing you again today! As you know, I value our relationship and want to provide genuine, compassionate, and quality care. I welcome your feedback. If you receive a survey regarding your visit,  I greatly appreciate you taking time to fill this out. See you next time!  Annitta Needs, PhD, ANP-BC [facility]   Gastritis, Adult Gastritis is inflammation of the stomach. There are two kinds of gastritis:  Acute gastritis. This kind develops suddenly.  Chronic gastritis. This kind is much more common and lasts for a long time. Gastritis happens when the lining of the stomach becomes weak or gets damaged. Without treatment, gastritis can lead to stomach bleeding and ulcers. What are the causes? This condition may be caused by:  An infection.  Drinking too much alcohol.  Certain medicines. These include steroids, antibiotics, and some over-the-counter medicines, such as aspirin or ibuprofen.  Having too much acid in the stomach.  A disease of the intestines or stomach.  Stress.  An allergic reaction.  Crohn's disease.  Some cancer treatments (radiation). Sometimes the cause of this condition is not known. What are the signs or symptoms? Symptoms of this condition include:  Pain or a burning sensation in the upper abdomen.  Nausea.  Vomiting.  An uncomfortable feeling of fullness after eating.  Weight loss.  Bad breath.  Blood in your vomit or stools. In some cases, there are no symptoms. How is this diagnosed? This condition may be diagnosed with:  Your medical history and a description of your symptoms.  A physical exam.  Tests. These can  include: ? Blood tests. ? Stool tests. ? A test in which a thin, flexible instrument with a light and a camera is passed down the esophagus and into the stomach (upper endoscopy). ? A test in which a sample of tissue is taken for testing (biopsy). How is this treated? This condition may be treated with medicines. The medicines that are used vary depending on the cause of the gastritis:  If the condition is caused by a bacterial infection, you may be given antibiotic medicines.  If the condition is caused by too much acid in the stomach, you may be given medicines called H2 blockers, proton pump inhibitors, or antacids. Treatment may also involve stopping the use of certain medicines, such as aspirin, ibuprofen, or other NSAIDs. Follow these instructions at home: Medicines  Take over-the-counter and prescription medicines only as told by your health care provider.  If you were prescribed an antibiotic medicine, take it as told by your health care provider. Do not stop taking the antibiotic even if you start to feel better. Eating and drinking   Eat small, frequent meals instead of large meals.  Avoid foods and drinks that make your symptoms worse.  Drink enough fluid to keep your urine pale yellow. Alcohol use  Do not drink alcohol if: ? Your health care provider tells you not to drink. ? You are pregnant, may be pregnant, or are planning to become pregnant.  If you drink alcohol: ? Limit your use to:  0-1 drink a day for women.  0-2 drinks a day for men. ?  Be aware of how much alcohol is in your drink. In the [place] one drink equals one 12 oz bottle of beer (355 mL), one 5 oz glass of wine (148 mL), or one 1 oz glass of hard liquor (44 mL). General instructions  Talk with your health care provider about ways to manage stress, such as getting regular exercise or practicing deep breathing, meditation, or yoga.  Do not use any products that contain nicotine or tobacco, such as  cigarettes and e-cigarettes. If you need help quitting, ask your health care provider.  Keep all follow-up visits as told by your health care provider. This is important. Contact a health care provider if:  Your symptoms get worse.  Your symptoms return after treatment. Get help right away if:  You vomit blood or material that looks like coffee grounds.  You have black or dark red stools.  You are unable to keep fluids down.  Your abdominal pain gets worse.  You have a fever.  You do not feel better after one week. Summary  Gastritis is inflammation of the lining of the stomach that can occur suddenly (acute) or develop slowly over time (chronic).  This condition is diagnosed with a medical history, a physical exam, or tests.  This condition may be treated with medicines to treat infection or medicines to reduce the amount of acid in your stomach.  Follow your health care provider's instructions about taking medicines, making changes to your diet, and knowing when to call for help. This information is not intended to replace advice given to you by your health care provider. Make sure you discuss any questions you have with your health care provider. Document Released: 06/27/2001 Document Revised: 11/20/2017 Document Reviewed: 11/20/2017 Elsevier Patient Education  2020 [place] for Gastroesophageal Reflux Disease, Adult When you have gastroesophageal reflux disease (GERD), the foods you eat and your eating habits are very important. Choosing the right foods can help ease the discomfort of GERD. Consider working with a diet and nutrition specialist (dietitian) to help you make healthy food choices. What general guidelines should I follow?  Eating plan  Choose healthy foods low in fat, such as fruits, vegetables, whole grains, low-fat dairy products, and lean meat, fish, and poultry.  Eat frequent, small meals instead of three large meals each day. Eat your  meals slowly, in a relaxed setting. Avoid bending over or lying down until 2-3 hours after eating.  Limit high-fat foods such as fatty meats or fried foods.  Limit your intake of oils, butter, and shortening to less than 8 teaspoons each day.  Avoid the following: ? Foods that cause symptoms. These may be different for different people. Keep a food diary to keep track of foods that cause symptoms. ? Alcohol. ? Drinking large amounts of liquid with meals. ? Eating meals during the 2-3 hours before bed.  Cook foods using methods other than frying. This may include baking, grilling, or broiling. Lifestyle  Maintain a healthy weight. Ask your health care provider what weight is healthy for you. If you need to lose weight, work with your health care provider to do so safely.  Exercise for at least 30 minutes on 5 or more days each week, or as told by your health care provider.  Avoid wearing clothes that fit tightly around your waist and chest.  Do not use any products that contain nicotine or tobacco, such as cigarettes and e-cigarettes. If you need help quitting, ask your health  care provider.  Sleep with the head of your bed raised. Use a wedge under the mattress or blocks under the bed frame to raise the head of the bed. What foods are not recommended? The items listed may not be a complete list. Talk with your dietitian about what dietary choices are best for you. Grains Pastries or quick breads with added fat. [place] toast. Vegetables Deep fried vegetables. [place] fries. Any vegetables prepared with added fat. Any vegetables that cause symptoms. For some people this may include tomatoes and tomato products, chili peppers, onions and garlic, and horseradish. Fruits Any fruits prepared with added fat. Any fruits that cause symptoms. For some people this may include citrus fruits, such as oranges, grapefruit, pineapple, and lemons. Meats and other protein foods High-fat meats, such as  fatty beef or pork, hot dogs, ribs, ham, sausage, salami and bacon. Fried meat or protein, including fried fish and fried chicken. Nuts and nut butters. Dairy Whole milk and chocolate milk. Sour cream. Cream. Ice cream. Cream cheese. Milk shakes. Beverages Coffee and tea, with or without caffeine. Carbonated beverages. Sodas. Energy drinks. Fruit juice made with acidic fruits (such as orange or grapefruit). Tomato juice. Alcoholic drinks. Fats and oils Butter. Margarine. Shortening. Ghee. Sweets and desserts Chocolate and cocoa. Donuts. Seasoning and other foods Pepper. Peppermint and spearmint. Any condiments, herbs, or seasonings that cause symptoms. For some people, this may include curry, hot sauce, or vinegar-based salad dressings. Summary  When you have gastroesophageal reflux disease (GERD), food and lifestyle choices are very important to help ease the discomfort of GERD.  Eat frequent, small meals instead of three large meals each day. Eat your meals slowly, in a relaxed setting. Avoid bending over or lying down until 2-3 hours after eating.  Limit high-fat foods such as fatty meat or fried foods. This information is not intended to replace advice given to you by your health care provider. Make sure you discuss any questions you have with your health care provider. Document Released: 07/03/2005 Document Revised: 10/24/2018 Document Reviewed: 07/04/2016 Elsevier Patient Education  2020 [employer].

## 2019-06-05 ENCOUNTER — Other Ambulatory Visit: Payer: Self-pay

## 2019-06-05 ENCOUNTER — Ambulatory Visit (INDEPENDENT_AMBULATORY_CARE_PROVIDER_SITE_OTHER): Payer: Self-pay | Admitting: Psychiatry

## 2019-06-05 ENCOUNTER — Encounter (HOSPITAL_COMMUNITY): Payer: Self-pay | Admitting: Psychiatry

## 2019-06-05 DIAGNOSIS — F4001 Agoraphobia with panic disorder: Secondary | ICD-10-CM

## 2019-06-05 MED ORDER — ALPRAZOLAM 0.5 MG PO TABS: ORAL_TABLET | ORAL | 2 refills | Status: DC

## 2019-06-05 NOTE — Progress Notes (Signed)
Virtual Visit via Telephone Note  I connected with Richard [name] on 06/05/19 at  9:40 AM EST by telephone and verified that I am speaking with the correct person using two identifiers.   I discussed the limitations, risks, security and privacy concerns of performing an evaluation and management service by telephone and the availability of in person appointments. I also discussed with the patient that there may be a patient responsible charge related to this service. The patient expressed understanding and agreed to proceed.      I discussed the assessment and treatment plan with the patient. The patient was provided an opportunity to ask questions and all were answered. The patient agreed with the plan and demonstrated an understanding of the instructions.   The patient was advised to call back or seek an in-person evaluation if the symptoms worsen or if the condition fails to improve as anticipated.  I provided 15 minutes of non-face-to-face time during this encounter.   [name], MD  Gi Or Norman MD/PA/NP OP Progress Note  06/05/2019 9:59 AM Richard [name]  MRN:  [id number]  Chief Complaint:  Chief Complaint    Anxiety; Follow-up     HPI: Thispatient is a 53 year old Caucasian male who came for his followup appointment. He is currently living with his wife in [place]. He is unemployed and applying for disability.  The patient states that he has been employed for 5 years. 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 He got injured on the job and both shoulders were torn. He has not been able to work ever since and he feels very badly about this. 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  Since he's not been able to work the patient has been having increasingly depressed and anxious. He feels like his body gives out when he is trying to exert himself even in a minor way.  The patient returns for follow-up after  about a 54-month absence.  He states that he is doing about the same.  He still claims to have bouts of atrial fibrillation that can turn into panic attacks at times.  He has difficulty getting to sleep because he worries about not being able to do enough to help his wife.  Both his shoulders hurt and it is hard for him to do a lot of physical labor.  He does think the Xanax can continues to help his anxiety and panic disorder.  We have tried numerous antidepressants but he always develops side effects.  He denies thoughts of self-harm or suicide and is seems to have a fairly positive attitude. Visit Diagnosis:    ICD-10-CM   1. 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 Arthritis    "legs, spine, shoulders" (08/12/2015)  . Asthma   . Colitis 1990  . COPD (chronic obstructive pulmonary disease) ([place])   . Depression   . Gastric ulcer 2003; 2012   2003: + esophagitis; negative H.pylori serology  2012: Dr. Oneida Alar, mild gastritis, Bravo PH probe placement, negative H.pylori  . GERD (gastroesophageal reflux disease)   . Hepatic steatosis   . History of hiatal hernia   . Hyperlipemia   . Hypertension   . Mild CAD    a. Cardiac cath 07/2015 showed 65% distal Cx, 20% mid-distal  LAD, 20% prox-distal RCA, EF 60%, EDP 38mmHg.  [name] Overweight   . Panic attacks   . Paroxysmal atrial fibrillation (H[facility])   . Stroke Richard [name])    mucsle in left side of face dosent work as its supposed to-only deficit.  [name] TIA (transient ischemic attack)    after cath 1/ 17  . Tobacco abuse    1/2 pack per day  . Type II diabetes mellitus ([place])     Past Surgical History:  Procedure Laterality Date  . BRAVO [place] STUDY  05/03/2011   [id number] gastritis/normal esophagus and duodenum  . CARDIAC CATHETERIZATION  1990s X 1; 2005; 08/12/2015  . CARDIAC CATHETERIZATION N/A 08/12/2015   Procedure: Left Heart Cath  and Coronary Angiography;  Surgeon: Belva Crome, MD; LAD 20%, CFX 65%, RCA 20%, EF 60%   . COLONOSCOPY  1990  . COLONOSCOPY WITH PROPOFOL N/A 11/21/2016   Dr. Oneida Alar: non-thrombosed external hemorrhoids, one 6 mm polyp (polypoid lesion), internal hemorrhoids. TI Normal. 10 years screening  . ESOPHAGOGASTRODUODENOSCOPY  05/03/2011   [id number] gastritis  . NECK MASS EXCISION Right    "done in dr's office; behind right ear/side of ncek"  . POLYPECTOMY  11/21/2016   Procedure: POLYPECTOMY;  Surgeon: [name], MD;  Location: [facility];  Service: Endoscopy;;  descending colon polyp  . SHOULDER ARTHROSCOPY W/ ROTATOR CUFF REPAIR Right 2006   acromioclavicular joint arthrosis    Family Psychiatric History: see below  Family History:  Family History  Problem Relation Age of Onset  . Lung cancer Mother   . Alcohol abuse Mother   . Heart attack Father 64  . Diabetes Father   . Alcohol abuse Father   . Hypertension Brother   . Hypertension Brother   . Anxiety disorder Sister   . Depression Sister   . Anxiety disorder Sister   . Heart attack Brother 79  . Diabetes Brother   . Hypertension Brother   . Seizures Brother   . Dementia Paternal Uncle   . Dementia Cousin   . ADD / ADHD Daughter   . Colon cancer Neg Hx   . Drug abuse Neg Hx   . Bipolar disorder Neg Hx   . OCD Neg Hx   . Paranoid behavior Neg Hx   . Schizophrenia Neg Hx   . Sexual abuse Neg Hx   . Physical abuse Neg Hx     Social History:  Social History   Socioeconomic History  . Marital status: Married    Spouse name: Not on file  . Number of children: Not on file  . Years of education: Not on file  . Highest education level: Not on file  Occupational History  . Occupation: full time    Employer: UNEMPLOYED  Social Needs  . Financial resource strain: Not on file  . Food insecurity    Worry: Not on file    Inability: Not on file  . Transportation needs    Medical: Not on file    Non-medical: Not on  file  Tobacco Use  . Smoking status: Current Every Day Smoker    Packs/day: 0.25    Years: 25.00    Pack years: 6.25    Types: Cigarettes    Start date: 07/17/1982  . Smokeless tobacco: Never Used  Substance and Sexual Activity  . Alcohol use: No    Alcohol/week: 0.0 standard drinks  . Drug use: No  . Sexual activity: Yes    Birth control/protection: None  Lifestyle  .  Physical activity    Days per week: Not on file    Minutes per session: Not on file  . Stress: Not on file  Relationships  . Social Herbalist on phone: Not on file    Gets together: Not on file    Attends religious service: Not on file    Active member of club or organization: Not on file    Attends meetings of clubs or organizations: Not on file    Relationship status: Not on file  Other Topics Concern  . Not on file  Social History Narrative   Pt lives in [place] with wife.  5 children.  Unemployed due to panic attacks and back pain    Allergies:  Allergies  Allergen Reactions  . Dexilant [Dexlansoprazole] Anaphylaxis  . Mushroom Ext Cmplx(Shiitake-Reishi-Mait) Anaphylaxis    Rapid heart rate.  . Penicillins Anaphylaxis    Has patient had a PCN reaction causing immediate rash, facial/tongue/throat swelling, SOB or lightheadedness with hypotension: Yes Has patient had a PCN reaction causing severe rash involving mucus membranes or skin necrosis: No Has patient had a PCN reaction that required hospitalization Yes Has patient had a PCN reaction occurring within the last 10 years: No If all of the above answers are "NO", then may proceed with Cephalosporin use.   [name] Doxycycline     Metabolic Disorder Labs: Lab Results  Component Value Date   HGBA1C 7.0 (H) 01/20/2019   MPG 154.2 01/20/2019   MPG 191.51 09/09/2018   No results found for: PROLACTIN Lab Results  Component Value Date   CHOL 127 01/20/2019   TRIG 174 (H) 01/20/2019   HDL 30 (L) 01/20/2019   CHOLHDL 4.2 01/20/2019    VLDL 35 01/20/2019   LDLCALC 62 01/20/2019   LDLCALC 84 09/09/2018   Lab Results  Component Value Date   TSH 0.958 04/21/2011   TSH 1.402 05/27/2009    Therapeutic Level Labs: No results found for: LITHIUM No results found for: VALPROATE No components found for:  CBMZ  Current Medications: Current Outpatient Medications  Medication Sig Dispense Refill  . acetaminophen (TYLENOL) 325 MG tablet Take 650 mg by mouth every 6 (six) hours as needed for pain or fever.     . ALPRAZolam (XANAX) 0.5 MG tablet Take one tablet by mouth three times a day and two tablets at night 150 tablet 2  . amLODipine (NORVASC) 10 MG tablet Take 1 tablet (10 mg total) by mouth daily. 90 tablet 3  . budesonide-formoterol (SYMBICORT) 80-4.5 MCG/ACT inhaler Inhale 2 puffs into the lungs 2 (two) times daily as needed.     . calcium carbonate (TUMS - DOSED IN MG ELEMENTAL CALCIUM) 500 MG chewable tablet Chew 1 tablet by mouth daily as needed for indigestion or heartburn.    . cyclobenzaprine (FLEXERIL) 10 MG tablet Take 1 tablet (10 mg total) by mouth 3 (three) times daily. (Patient taking differently: Take 10 mg by mouth 3 (three) times daily as needed for muscle spasms. ) 21 tablet 0  . esomeprazole (NEXIUM) 20 MG capsule Take 20 mg by mouth every morning.     [name] HYDROcodone-acetaminophen (NORCO/VICODIN) 5-325 MG tablet Take one to two tablets every 4-6 hours as needed for pain 40 tablet 0  . JANUVIA 100 MG tablet TAKE 1 Tablet BY MOUTH ONCE DAILY 90 tablet 1  . metFORMIN (GLUCOPHAGE) 500 MG tablet Take by mouth daily with breakfast.    . metoprolol succinate (TOPROL-XL) 50 MG 24 hr  tablet TAKE 1 AND A HALF TABLETS BY MOUTH EVERY DAY 135 tablet 3  . nitroGLYCERIN (NITROSTAT) 0.4 MG SL tablet Place 1 tablet (0.4 mg total) under the tongue every 5 (five) minutes x 3 doses as needed for chest pain (if no relief after 3rd dose, proceed to the ED for an evaluation). 25 tablet 3  . PROVENTIL HFA 108 (90 Base) MCG/ACT  inhaler INHALE 2 PUFFS BY MOUTH EVERY 6 HOURS AS NEEDED FOR WHEEZING OR SHORTNESS OF BREATH 20.1 g 1  . simvastatin (ZOCOR) 20 MG tablet TAKE 1 TABLET BY MOUTH ONCE DAILY AT BEDTIME 30 tablet 4  . XARELTO 20 MG TABS tablet TAKE 1 Tablet BY MOUTH ONCE DAILY WITH SUPPER 90 tablet 3   No current facility-administered medications for this visit.      Musculoskeletal: Strength & Muscle Tone: decreased Gait & Station: normal Patient leans: N/A  Psychiatric Specialty Exam: Review of Systems  Cardiovascular: Positive for palpitations.  Musculoskeletal: Positive for joint pain.  Psychiatric/Behavioral: The patient is nervous/anxious.   All other systems reviewed and are negative.   There were no vitals taken for this visit.There is no height or weight on file to calculate BMI.  General Appearance: NA  Eye Contact:  NA  Speech:  Clear and Coherent  Volume:  Normal  Mood:  Anxious  Affect:  NA  Thought Process:  Goal Directed  Orientation:  Full (Time, Place, and Person)  Thought Content: Rumination   Suicidal Thoughts:  No  Homicidal Thoughts:  No  Memory:  Immediate;   Good Recent;   Good Remote;   Fair  Judgement:  Good  Insight:  Fair  Psychomotor Activity:  Decreased  Concentration:  Concentration: Good and Attention Span: Good  Recall:  Good  Fund of Knowledge: Good  Language: Good  Akathisia:  No  Handed:  Right  AIMS (if indicated): not done  Assets:  Communication Skills Desire for Improvement Resilience Social Support Talents/Skills  ADL's:  Intact  Cognition: WNL  Sleep:  Good   Screenings: PHQ2-9     Patient Outreach Telephone from 08/30/2015 in [facility] Patient Outreach Telephone from 08/26/2015 in [facility]  PHQ-2 Total Score  2  4  PHQ-9 Total Score  8  8       Assessment and Plan: This patient is a 53 year old male with a history of anxiety and severe panic attacks.  They seem somewhat connected to his atrial  fibrillation.  He states that the Xanax is continuing to work well and he does not think he be able to function without it.  He will continue Xanax 0.5 mg 3 times daily and 1 mg at bedtime.  He is going to try to increase his activity level as tolerated and hopefully this will help him sleep better.  He will return to see me in 3 months   [name], MD 06/05/2019, 9:59 AM

## 2019-06-09 NOTE — Progress Notes (Signed)
cc'ed to pcp °

## 2019-06-16 ENCOUNTER — Telehealth: Payer: Self-pay | Admitting: Gastroenterology

## 2019-06-16 NOTE — Telephone Encounter (Signed)
Patient wife called asking if we have any nexium samples

## 2019-06-16 NOTE — Telephone Encounter (Signed)
Left Vm that we do not have samples of Nexium.

## 2019-07-03 ENCOUNTER — Telehealth: Payer: Self-pay | Admitting: Gastroenterology

## 2019-07-03 NOTE — Telephone Encounter (Signed)
Please advise 

## 2019-07-03 NOTE — Telephone Encounter (Signed)
If we have samples, please provide some. If not, how can we get them?

## 2019-07-03 NOTE — Telephone Encounter (Signed)
Patient said Richard [name] was going to try and get extra nexium samples for her husband and they have not heard yet if we were able to get any.  She wanted him to take 4 a day and they can not afford them otc.  Is there another option?

## 2019-07-04 NOTE — Telephone Encounter (Signed)
Left a message. We currently don't have any samples and will notify pt when they are available.

## 2019-07-28 ENCOUNTER — Other Ambulatory Visit: Payer: Self-pay | Admitting: Cardiovascular Disease

## 2019-08-04 ENCOUNTER — Telehealth: Payer: Self-pay | Admitting: Cardiovascular Disease

## 2019-08-04 ENCOUNTER — Telehealth: Payer: Self-pay | Admitting: Licensed Clinical Social Worker

## 2019-08-04 ENCOUNTER — Other Ambulatory Visit (HOSPITAL_COMMUNITY)
Admission: RE | Admit: 2019-08-04 | Discharge: 2019-08-04 | Disposition: A | Discharge status: Home / Self Care | Payer: Self-pay | Source: Ambulatory Visit | Attending: Physician Assistant | Admitting: Physician Assistant

## 2019-08-04 DIAGNOSIS — E118 Type 2 diabetes mellitus with unspecified complications: Secondary | ICD-10-CM

## 2019-08-04 DIAGNOSIS — E785 Hyperlipidemia, unspecified: Secondary | ICD-10-CM

## 2019-08-04 DIAGNOSIS — I10 Essential (primary) hypertension: Secondary | ICD-10-CM

## 2019-08-04 LAB — COMPREHENSIVE METABOLIC PANEL
ALT: 17 U/L (ref 0–44)
AST: 14 U/L — ABNORMAL LOW (ref 15–41)
Albumin: 4 g/dL (ref 3.5–5.0)
Alkaline Phosphatase: 51 U/L (ref 38–126)
Anion gap: 9 (ref 5–15)
BUN: 15 mg/dL (ref 6–20)
CO2: 25 mmol/L (ref 22–32)
Calcium: 9 mg/dL (ref 8.9–10.3)
Chloride: 104 mmol/L (ref 98–111)
Creatinine, Ser: 0.93 mg/dL (ref 0.61–1.24)
GFR calc Af Amer: >60 mL/min (ref >60)
GFR calc non Af Amer: >60 mL/min (ref >60)
Glucose, Bld: 138 mg/dL — ABNORMAL HIGH (ref 70–99)
Potassium: 4 mmol/L (ref 3.5–5.1)
Sodium: 138 mmol/L (ref 135–145)
Total Bilirubin: 0.5 mg/dL (ref 0.3–1.2)
Total Protein: 7.1 g/dL (ref 6.5–8.1)

## 2019-08-04 LAB — LIPID PANEL
Cholesterol: 112 mg/dL (ref 0–200)
HDL: 28 mg/dL — ABNORMAL LOW (ref >40)
LDL Cholesterol: 65 mg/dL (ref 0–99)
Total CHOL/HDL Ratio: 4 RATIO
Triglycerides: 96 mg/dL (ref <150)
VLDL: 19 mg/dL (ref 0–40)

## 2019-08-04 LAB — HEMOGLOBIN A1C
Hgb A1c MFr Bld: 6.6 % — ABNORMAL HIGH (ref 4.8–5.6)
Mean Plasma Glucose: 142.72 mg/dL

## 2019-08-04 NOTE — Telephone Encounter (Signed)
Wife states patient has more heart fluttering sensation at night.I told them to take amlodipine in the am and Toprol at night.He had been doing the meds the opposite. Last week he started having headaches and had some sinus congestion which has resolved.He is using tylenol and they are going to try zyrtec daily.he has also lost 20 lbs and wondered if his medication doses need to be changed.I told them we need to see blood pressure and HR recordings.They do not have a working bp monitor, I have requested one from [facility], [place]. He will then record BP's daily and call me back.

## 2019-08-04 NOTE — Telephone Encounter (Signed)
Patient is having issues with headaches. States that when he bends over he feels like he is going to tumble over. Patient has questions about his BP and his BP medication.

## 2019-08-04 NOTE — Telephone Encounter (Signed)
CSW referred to assist patient with obtaining a BP cuff. CSW contacted patient to inform cuff will be delivered to home. Patient grateful for support and assistance. CSW available as needed. Jackie Cabell Lazenby, LCSW, CCSW-MCS [phone]  

## 2019-08-07 ENCOUNTER — Other Ambulatory Visit: Payer: Self-pay | Admitting: Physician Assistant

## 2019-08-19 ENCOUNTER — Encounter: Payer: Self-pay | Admitting: Physician Assistant

## 2019-08-19 ENCOUNTER — Ambulatory Visit: Payer: Self-pay | Admitting: Physician Assistant

## 2019-08-19 VITALS — BP 142/81 | HR 61

## 2019-08-19 DIAGNOSIS — F39 Unspecified mood [affective] disorder: Secondary | ICD-10-CM

## 2019-08-19 DIAGNOSIS — E118 Type 2 diabetes mellitus with unspecified complications: Secondary | ICD-10-CM

## 2019-08-19 DIAGNOSIS — E785 Hyperlipidemia, unspecified: Secondary | ICD-10-CM

## 2019-08-19 DIAGNOSIS — I10 Essential (primary) hypertension: Secondary | ICD-10-CM

## 2019-08-19 DIAGNOSIS — F17200 Nicotine dependence, unspecified, uncomplicated: Secondary | ICD-10-CM

## 2019-08-19 DIAGNOSIS — J449 Chronic obstructive pulmonary disease, unspecified: Secondary | ICD-10-CM

## 2019-08-19 DIAGNOSIS — Z7901 Long term (current) use of anticoagulants: Secondary | ICD-10-CM

## 2019-08-19 DIAGNOSIS — I480 Paroxysmal atrial fibrillation: Secondary | ICD-10-CM

## 2019-08-19 MED ORDER — SITAGLIPTIN PHOSPHATE 100 MG PO TABS: 100.0000 mg | ORAL_TABLET | Freq: Every day | ORAL | 1 refills | Status: DC

## 2019-08-19 NOTE — Progress Notes (Signed)
BP (!) 142/81   Pulse 61    Subjective:    Patient ID: Richard [name], male    DOB: 22-Jan-1966, 54 y.o.   MRN: [id number]  HPI: Richard [name] is a 54 y.o. male presenting on 08/19/2019 for No chief complaint on file.   HPI   This is a telemedicine appointment due to coronavirus pandemic.  It is via telephone as patient does not have a video enabled device.   I connected with  Flor L Goerke on 08/19/19 by a video enabled telemedicine application and verified that I am speaking with the correct person using two identifiers.   I discussed the limitations of evaluation and management by telemedicine. The patient expressed understanding and agreed to proceed.  Patient is at home.  Provider is at office.     Patient is a 54 year old male with diabetes, dyslipidemia, A. fib, hypertension, tobacco use disorder, anxiety and panic attacks, GERD, COPD, and long-term anticoagulation.   Patient is currently being followed by gastroenterology, cardiology and mental health.  He is still doing okay but c/o his afib.  Cardiology has recommended that patient undergo ablation.  Pt doesn't want to get ablation- he thinks that won't fix the problem, according to his research.  Pt thinks spicy or greasy foods are causing his afib.  Patient has no new complaints today.     Relevant past medical, surgical, family and social history reviewed and updated as indicated. Interim medical history since our last visit reviewed. Allergies and medications reviewed and updated.   Current Outpatient Medications:  .  acetaminophen (TYLENOL) 325 MG tablet, Take 650 mg by mouth every 6 (six) hours as needed for pain or fever. , Disp: , Rfl:  .  ALPRAZolam (XANAX) 0.5 MG tablet, Take one tablet by mouth three times a day and two tablets at night, Disp: 150 tablet, Rfl: 2 .  amLODipine (NORVASC) 10 MG tablet, Take 1 tablet (10 mg total) by mouth daily., Disp: 90 tablet, Rfl: 3 .  budesonide-formoterol  (SYMBICORT) 80-4.5 MCG/ACT inhaler, Inhale 2 puffs into the lungs 2 (two) times daily as needed. , Disp: , Rfl:  .  calcium carbonate (TUMS - DOSED IN MG ELEMENTAL CALCIUM) 500 MG chewable tablet, Chew 1 tablet by mouth daily as needed for indigestion or heartburn., Disp: , Rfl:  .  cyclobenzaprine (FLEXERIL) 10 MG tablet, Take 1 tablet (10 mg total) by mouth 3 (three) times daily. (Patient taking differently: Take 10 mg by mouth 3 (three) times daily as needed for muscle spasms. ), Disp: 21 tablet, Rfl: 0 .  esomeprazole (NEXIUM) 20 MG capsule, Take 20 mg by mouth every morning. , Disp: , Rfl:  .  HYDROcodone-acetaminophen (NORCO/VICODIN) 5-325 MG tablet, Take one to two tablets every 4-6 hours as needed for pain, Disp: 40 tablet, Rfl: 0 .  JANUVIA 100 MG tablet, TAKE 1 Tablet BY MOUTH ONCE DAILY, Disp: 90 tablet, Rfl: 1 .  metFORMIN (GLUCOPHAGE) 500 MG tablet, Take by mouth daily with breakfast., Disp: , Rfl:  .  metoprolol succinate (TOPROL-XL) 50 MG 24 hr tablet, TAKE 1 AND A HALF TABLETS BY MOUTH EVERY DAY, Disp: 135 tablet, Rfl: 3 .  nitroGLYCERIN (NITROSTAT) 0.4 MG SL tablet, Place 1 tablet (0.4 mg total) under the tongue every 5 (five) minutes x 3 doses as needed for chest pain (if no relief after 3rd dose, proceed to the ED for an evaluation)., Disp: 25 tablet, Rfl: 3 .  PROVENTIL HFA 108 (90 Base) MCG/ACT  inhaler, INHALE 2 PUFFS BY MOUTH EVERY 6 HOURS AS NEEDED FOR WHEEZING OR SHORTNESS OF BREATH, Disp: 20.1 g, Rfl: 1 .  simvastatin (ZOCOR) 20 MG tablet, TAKE 1 TABLET BY MOUTH ONCE DAILY AT BEDTIME, Disp: 30 tablet, Rfl: 4 .  XARELTO 20 MG TABS tablet, TAKE 1 Tablet BY MOUTH ONCE DAILY WITH SUPPER, Disp: 90 tablet, Rfl: 3     Review of Systems  Per HPI unless specifically indicated above     Objective:    BP (!) 142/81   Pulse 61   Wt Readings from Last 3 Encounters:  06/04/19 196 lb 12.8 oz (89.3 kg)  04/09/19 196 lb (88.9 kg)  03/28/19 198 lb (89.8 kg)    Physical  Exam Pulmonary:     Effort: Pulmonary effort is normal. No respiratory distress.  Neurological:     Mental Status: He is alert and oriented to person, place, and time.  Psychiatric:        Attention and Perception: Attention normal.        Speech: Speech normal.        Behavior: Behavior is cooperative.     Results for orders placed or performed during the hospital encounter of 08/04/19  Hemoglobin A1c  Result Value Ref Range   Hgb A1c MFr Bld 6.6 (H) 4.8 - 5.6 %   Mean Plasma Glucose 142.72 mg/dL  Lipid panel  Result Value Ref Range   Cholesterol 112 0 - 200 mg/dL   Triglycerides 96 <150 mg/dL   HDL 28 (L) >40 mg/dL   Total CHOL/HDL Ratio 4.0 RATIO   VLDL 19 0 - 40 mg/dL   LDL Cholesterol 65 0 - 99 mg/dL  Comprehensive metabolic panel  Result Value Ref Range   Sodium 138 135 - 145 mmol/L   Potassium 4.0 3.5 - 5.1 mmol/L   Chloride 104 98 - 111 mmol/L   CO2 25 22 - 32 mmol/L   Glucose, Bld 138 (H) 70 - 99 mg/dL   BUN 15 6 - 20 mg/dL   Creatinine, Ser 0.93 0.61 - 1.24 mg/dL   Calcium 9.0 8.9 - 10.3 mg/dL   Total Protein 7.1 6.5 - 8.1 g/dL   Albumin 4.0 3.5 - 5.0 g/dL   AST 14 (L) 15 - 41 U/L   ALT 17 0 - 44 U/L   Alkaline Phosphatase 51 38 - 126 U/L   Total Bilirubin 0.5 0.3 - 1.2 mg/dL   GFR calc non Af Amer >60 >60 mL/min   GFR calc Af Amer >60 >60 mL/min   Anion gap 9 5 - 15      Assessment & Plan:   Encounter Diagnoses  Name Primary?  . Controlled diabetes mellitus type 2 with complications, unspecified whether long term insulin use ([place]) Yes  . Essential hypertension   . Hyperlipidemia, unspecified hyperlipidemia type   . Tobacco use disorder   . Chronic obstructive pulmonary disease, unspecified COPD type ([place])   . Mood disorder ([place])   . Anticoagulant long-term use   . Paroxysmal atrial fibrillation ([place])      -Reviewed labs with patient -Refill januvia rx sent to [facility] -No changes to meds today; he is to continue current meds -Patient to  continue with specialists per their recommendation -Patient to follow-up in 3 months.  That appointment will have to be a virtual appointment as pt "cannot" wear a mask

## 2019-09-05 ENCOUNTER — Ambulatory Visit (INDEPENDENT_AMBULATORY_CARE_PROVIDER_SITE_OTHER): Payer: Self-pay | Admitting: Psychiatry

## 2019-09-05 ENCOUNTER — Other Ambulatory Visit: Payer: Self-pay

## 2019-09-05 ENCOUNTER — Encounter (HOSPITAL_COMMUNITY): Payer: Self-pay | Admitting: Psychiatry

## 2019-09-05 DIAGNOSIS — F329 Major depressive disorder, single episode, unspecified: Secondary | ICD-10-CM

## 2019-09-05 DIAGNOSIS — F4001 Agoraphobia with panic disorder: Secondary | ICD-10-CM

## 2019-09-05 DIAGNOSIS — F419 Anxiety disorder, unspecified: Secondary | ICD-10-CM

## 2019-09-05 MED ORDER — ALPRAZOLAM 0.5 MG PO TABS: ORAL_TABLET | ORAL | 2 refills | Status: DC
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
## 2019-09-15 ENCOUNTER — Other Ambulatory Visit: Payer: Self-pay | Admitting: Physician Assistant

## 2019-09-15 MED ORDER — SIMVASTATIN 20 MG PO TABS: 20.0000 mg | ORAL_TABLET | Freq: Every day | ORAL | 4 refills | Status: DC

## 2019-10-06 ENCOUNTER — Other Ambulatory Visit: Payer: Self-pay | Admitting: Physician Assistant

## 2019-10-06 ENCOUNTER — Other Ambulatory Visit: Payer: Self-pay | Admitting: Cardiovascular Disease

## 2019-11-18 ENCOUNTER — Ambulatory Visit: Payer: Self-pay | Admitting: Physician Assistant

## 2019-11-25 ENCOUNTER — Other Ambulatory Visit (HOSPITAL_COMMUNITY)
Admission: RE | Admit: 2019-11-25 | Discharge: 2019-11-25 | Disposition: A | Discharge status: Home / Self Care | Payer: Self-pay | Source: Ambulatory Visit | Attending: Physician Assistant | Admitting: Physician Assistant

## 2019-11-25 DIAGNOSIS — E118 Type 2 diabetes mellitus with unspecified complications: Secondary | ICD-10-CM | POA: Insufficient documentation

## 2019-11-25 DIAGNOSIS — I10 Essential (primary) hypertension: Secondary | ICD-10-CM | POA: Insufficient documentation

## 2019-11-25 DIAGNOSIS — E785 Hyperlipidemia, unspecified: Secondary | ICD-10-CM | POA: Insufficient documentation

## 2019-11-25 LAB — COMPREHENSIVE METABOLIC PANEL
ALT: 13 U/L (ref 0–44)
AST: 12 U/L — ABNORMAL LOW (ref 15–41)
Albumin: 4.2 g/dL (ref 3.5–5.0)
Alkaline Phosphatase: 50 U/L (ref 38–126)
Anion gap: 9 (ref 5–15)
BUN: 14 mg/dL (ref 6–20)
CO2: 26 mmol/L (ref 22–32)
Calcium: 9 mg/dL (ref 8.9–10.3)
Chloride: 104 mmol/L (ref 98–111)
Creatinine, Ser: 0.85 mg/dL (ref 0.61–1.24)
GFR calc Af Amer: >60 mL/min (ref >60)
GFR calc non Af Amer: >60 mL/min (ref >60)
Glucose, Bld: 143 mg/dL — ABNORMAL HIGH (ref 70–99)
Potassium: 3.3 mmol/L — ABNORMAL LOW (ref 3.5–5.1)
Sodium: 139 mmol/L (ref 135–145)
Total Bilirubin: 0.4 mg/dL (ref 0.3–1.2)
Total Protein: 7.3 g/dL (ref 6.5–8.1)

## 2019-11-25 LAB — LIPID PANEL
Cholesterol: 129 mg/dL (ref 0–200)
HDL: 34 mg/dL — ABNORMAL LOW (ref >40)
LDL Cholesterol: 75 mg/dL (ref 0–99)
Total CHOL/HDL Ratio: 3.8 RATIO
Triglycerides: 99 mg/dL (ref <150)
VLDL: 20 mg/dL (ref 0–40)

## 2019-11-25 LAB — HEMOGLOBIN A1C
Hgb A1c MFr Bld: 6.8 % — ABNORMAL HIGH (ref 4.8–5.6)
Mean Plasma Glucose: 148.46 mg/dL

## 2019-11-26 ENCOUNTER — Ambulatory Visit: Payer: Self-pay | Admitting: Physician Assistant

## 2019-11-26 ENCOUNTER — Telehealth (INDEPENDENT_AMBULATORY_CARE_PROVIDER_SITE_OTHER): Payer: Self-pay | Admitting: Psychiatry

## 2019-11-26 ENCOUNTER — Encounter (HOSPITAL_COMMUNITY): Payer: Self-pay | Admitting: Psychiatry

## 2019-11-26 ENCOUNTER — Other Ambulatory Visit: Payer: Self-pay

## 2019-11-26 ENCOUNTER — Encounter: Payer: Self-pay | Admitting: Physician Assistant

## 2019-11-26 DIAGNOSIS — E118 Type 2 diabetes mellitus with unspecified complications: Secondary | ICD-10-CM

## 2019-11-26 DIAGNOSIS — I10 Essential (primary) hypertension: Secondary | ICD-10-CM

## 2019-11-26 DIAGNOSIS — Z7901 Long term (current) use of anticoagulants: Secondary | ICD-10-CM

## 2019-11-26 DIAGNOSIS — F4001 Agoraphobia with panic disorder: Secondary | ICD-10-CM

## 2019-11-26 DIAGNOSIS — E876 Hypokalemia: Secondary | ICD-10-CM

## 2019-11-26 DIAGNOSIS — F17200 Nicotine dependence, unspecified, uncomplicated: Secondary | ICD-10-CM

## 2019-11-26 DIAGNOSIS — Z2821 Immunization not carried out because of patient refusal: Secondary | ICD-10-CM

## 2019-11-26 DIAGNOSIS — F39 Unspecified mood [affective] disorder: Secondary | ICD-10-CM

## 2019-11-26 DIAGNOSIS — E785 Hyperlipidemia, unspecified: Secondary | ICD-10-CM

## 2019-11-26 DIAGNOSIS — J449 Chronic obstructive pulmonary disease, unspecified: Secondary | ICD-10-CM

## 2019-11-26 MED ORDER — ALPRAZOLAM 0.5 MG PO TABS: ORAL_TABLET | ORAL | 2 refills | Status: DC
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
## 2019-11-26 NOTE — Progress Notes (Signed)
There were no vitals taken for this visit.   Subjective:    Patient ID: Richard [name], male    DOB: July 10, 1966, 54 y.o.   MRN: [id number]  HPI: Richard [name] is a 54 y.o. male presenting on 11/26/2019 for No chief complaint on file.   HPI   This is a telemedicine appointment via [employer] due to coronavirus pandemic.    I connected with  Richard [name] on 11/26/19 by a video enabled telemedicine application and verified that I am speaking with the correct person using two identifiers.   I discussed the limitations of evaluation and management by telemedicine. The patient expressed understanding and agreed to proceed.  Pt is in his parked vehicle in parking lot. Provider is in office.      Pt is 61yoM with DM, MH issues, HTN, dyslipidemia, COPD, gerd, long-term anticoaguation and  A fib.Richard [name]  He is in need of in person appointment but refuses to wear an appropriate mask so is therefore only able to have virtual appointment.  Discussed this with pt at previous appointment and again today and he still declines to wear appropriate mask.  He didn't bring his meds, just a list.   He says he's taking whatever is on the list.  Discussed why he needs to have his bottles with him for his appointment.    Pt states only problems is back and neck pain  He sees [name] for [facility] issues.  Has appt with her later today.   He has f/u with GI next week.     Relevant past medical, surgical, family and social history reviewed and updated as indicated. Interim medical history since our last visit reviewed. Allergies and medications reviewed and updated.   Current Outpatient Medications:  .  acetaminophen (TYLENOL) 325 MG tablet, Take 650 mg by mouth every 6 (six) hours as needed for pain or fever. , Disp: , Rfl:  .  ALPRAZolam (XANAX) 0.5 MG tablet, Take one tablet by mouth three times a day and two tablets at night, Disp: 150 tablet, Rfl: 2 .  amLODipine (NORVASC) 10 MG tablet, TAKE 1 Tablet BY MOUTH  EVERY DAY, Disp: 90 tablet, Rfl: 3 .  budesonide-formoterol (SYMBICORT) 80-4.5 MCG/ACT inhaler, Inhale 2 puffs into the lungs 2 (two) times daily as needed. , Disp: , Rfl:  .  calcium carbonate (TUMS - DOSED IN MG ELEMENTAL CALCIUM) 500 MG chewable tablet, Chew 1 tablet by mouth daily as needed for indigestion or heartburn., Disp: , Rfl:  .  cyclobenzaprine (FLEXERIL) 10 MG tablet, Take 1 tablet (10 mg total) by mouth 3 (three) times daily. (Patient taking differently: Take 10 mg by mouth 3 (three) times daily as needed for muscle spasms. ), Disp: 21 tablet, Rfl: 0 .  esomeprazole (NEXIUM) 20 MG capsule, Take 20 mg by mouth every morning. , Disp: , Rfl:  .  HYDROcodone-acetaminophen (NORCO/VICODIN) 5-325 MG tablet, Take one to two tablets every 4-6 hours as needed for pain, Disp: 40 tablet, Rfl: 0 .  metFORMIN (GLUCOPHAGE) 500 MG tablet, Take by mouth daily with breakfast., Disp: , Rfl:  .  metoprolol succinate (TOPROL-XL) 50 MG 24 hr tablet, TAKE 1 AND A HALF TABLETS BY MOUTH EVERY DAY, Disp: 135 tablet, Rfl: 3 .  nitroGLYCERIN (NITROSTAT) 0.4 MG SL tablet, Place 1 tablet (0.4 mg total) under the tongue every 5 (five) minutes x 3 doses as needed for chest pain (if no relief after 3rd dose, proceed to the ED for  an evaluation)., Disp: 25 tablet, Rfl: 3 .  PROVENTIL HFA 108 (90 Base) MCG/ACT inhaler, INHALE 2 PUFFS BY MOUTH EVERY 6 HOURS AS NEEDED FOR WHEEZING OR SHORTNESS OF BREATH, Disp: 20.1 g, Rfl: 1 .  simvastatin (ZOCOR) 20 MG tablet, TAKE 1 TABLET BY MOUTH ONCE DAILY AT BEDTIME, Disp: 30 tablet, Rfl: 3 .  sitaGLIPtin (JANUVIA) 100 MG tablet, Take 1 tablet (100 mg total) by mouth daily., Disp: 90 tablet, Rfl: 1 .  XARELTO 20 MG TABS tablet, TAKE 1 Tablet BY MOUTH ONCE DAILY WITH SUPPER, Disp: 90 tablet, Rfl: 3      Review of Systems  Per HPI unless specifically indicated above     Objective:    There were no vitals taken for this visit.  Wt Readings from Last 3 Encounters:  06/04/19  196 lb 12.8 oz (89.3 kg)  04/09/19 196 lb (88.9 kg)  03/28/19 198 lb (89.8 kg)    Physical Exam Constitutional:      General: He is not in acute distress. HENT:     Head: Normocephalic and atraumatic.  Pulmonary:     Effort: No respiratory distress.  Neurological:     Mental Status: He is alert and oriented to person, place, and time.  Psychiatric:        Attention and Perception: Attention normal.        Speech: Speech normal.     Results for orders placed or performed during the hospital encounter of 11/25/19  Hemoglobin A1c  Result Value Ref Range   Hgb A1c MFr Bld 6.8 (H) 4.8 - 5.6 %   Mean Plasma Glucose 148.46 mg/dL  Lipid panel  Result Value Ref Range   Cholesterol 129 0 - 200 mg/dL   Triglycerides 99 <150 mg/dL   HDL 34 (L) >40 mg/dL   Total CHOL/HDL Ratio 3.8 RATIO   VLDL 20 0 - 40 mg/dL   LDL Cholesterol 75 0 - 99 mg/dL  Comprehensive metabolic panel  Result Value Ref Range   Sodium 139 135 - 145 mmol/L   Potassium 3.3 (L) 3.5 - 5.1 mmol/L   Chloride 104 98 - 111 mmol/L   CO2 26 22 - 32 mmol/L   Glucose, Bld 143 (H) 70 - 99 mg/dL   BUN 14 6 - 20 mg/dL   Creatinine, Ser 0.85 0.61 - 1.24 mg/dL   Calcium 9.0 8.9 - 10.3 mg/dL   Total Protein 7.3 6.5 - 8.1 g/dL   Albumin 4.2 3.5 - 5.0 g/dL   AST 12 (L) 15 - 41 U/L   ALT 13 0 - 44 U/L   Alkaline Phosphatase 50 38 - 126 U/L   Total Bilirubin 0.4 0.3 - 1.2 mg/dL   GFR calc non Af Amer >60 >60 mL/min   GFR calc Af Amer >60 >60 mL/min   Anion gap 9 5 - 15      Assessment & Plan:    Encounter Diagnoses  Name Primary?  . COVID-19 virus vaccination declined Yes  . Hypokalemia   . Controlled diabetes mellitus type 2 with complications, unspecified whether long term insulin use ([place])   . Essential hypertension   . Hyperlipidemia, unspecified hyperlipidemia type   . Tobacco use disorder   . Chronic obstructive pulmonary disease, unspecified COPD type ([place])   . Mood disorder ([place])   . Anticoagulant long-term  use      -Reviewed labs with pt.    -K+ low.  will recheck K+  -Pt declined covid vaccination  -no  changes to medications today  -Pt declines to wear any mask except a gaiter and discussed that is not acceptable so he will again be scheduled for virtual appt for follow upi n 4 months.

## 2019-12-02 ENCOUNTER — Ambulatory Visit (INDEPENDENT_AMBULATORY_CARE_PROVIDER_SITE_OTHER): Payer: Self-pay | Admitting: Gastroenterology

## 2019-12-02 ENCOUNTER — Encounter: Payer: Self-pay | Admitting: Gastroenterology

## 2019-12-02 ENCOUNTER — Other Ambulatory Visit: Payer: Self-pay

## 2019-12-02 VITALS — BP 132/80 | HR 59 | Temp 97.3°F | Ht 71.0 in | Wt 198.4 lb

## 2019-12-02 DIAGNOSIS — K5900 Constipation, unspecified: Secondary | ICD-10-CM

## 2019-12-02 DIAGNOSIS — K219 Gastro-esophageal reflux disease without esophagitis: Secondary | ICD-10-CM

## 2019-12-02 NOTE — Patient Instructions (Signed)
Let's try Linzess one capsule 30 minutes before breakfast daily. You may have some loose stool starting out, but it should get better. If not, we need to adjust the dose. Let us know how you like this, so we can get patient assistance.  You can add Pepcid daily to help with breakthrough reflux.  We will see you in 6 months!  I enjoyed seeing you again today! As you know, I value our relationship and want to provide genuine, compassionate, and quality care. I welcome your feedback. If you receive a survey regarding your visit,  I greatly appreciate you taking time to fill this out. See you next time!  Annitta Needs, PhD, ANP-BC [facility]

## 2019-12-02 NOTE — Progress Notes (Signed)
Primary Care Physician:  [name], PA-C Primary GI: Dr. Oneida Alar   Chief Complaint  Patient presents with  . Constipation    has to strain, sometimes goes 5 days without bm    HPI:   Richard [name] is a 54 y.o. male presenting today with a history of chronic GERD, abdominal pain, dysphagia, and constipation. 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   Dealing with mainly constipation lately. BM will be 5-6 days apart at times. Trying chewable probiotics OTC. Just gave him gas. Amitiza gave him diarrhea. When he does go, just tiny little pieces. Trying to eat healthy. Uses pressure cooker. Feels bloated all the time. Vague epigastric sharp pain at times.   GERD: Nexium daily. Woke up strangling at 4am. Can't afford it BID. States he has tried multiple PPIs and Nexium is only one that doesn't mess with him. Tums will help him calm it down. Sometimes eating later at night. Cut out [facility]. Sipping on pepsi instead.   Past Medical History:  Diagnosis Date  . Arthritis    "legs, spine, shoulders" (08/12/2015)  . Asthma   . Colitis 1990  . COPD (chronic obstructive pulmonary disease) ([place])   . Depression   . Gastric ulcer 2003; 2012   2003: + esophagitis; negative H.pylori serology  2012: Dr. Oneida Alar, mild gastritis, Bravo PH probe placement, negative H.pylori  . GERD (gastroesophageal reflux disease)   . Hepatic steatosis   . History of hiatal hernia   . Hyperlipemia   . Hypertension   . Mild CAD    a. Cardiac cath 07/2015 showed 65% distal Cx, 20% mid-distal LAD, 20% prox-distal RCA, EF 60%, EDP 79mmHg.  [name] Overweight   . Panic attacks   . Paroxysmal atrial fibrillation (HCC)   . Stroke [facility])    mucsle in left side of face dosent work as its supposed to-only deficit.  [name] TIA (transient ischemic attack)    after cath 1/ 17  . Tobacco abuse    1/2 pack per day  . Type II diabetes mellitus ([place])     Past Surgical History:  Procedure Laterality  Date  . BRAVO [place] STUDY  05/03/2011   [id number] gastritis/normal esophagus and duodenum  . CARDIAC CATHETERIZATION  1990s X 1; 2005; 08/12/2015  . CARDIAC CATHETERIZATION N/A 08/12/2015   Procedure: Left Heart Cath and Coronary Angiography;  Surgeon: Belva Crome, MD; LAD 20%, CFX 65%, RCA 20%, EF 60%   . COLONOSCOPY  1990  . COLONOSCOPY WITH PROPOFOL N/A 11/21/2016   Dr. Oneida Alar: non-thrombosed external hemorrhoids, one 6 mm polyp (polypoid lesion), internal hemorrhoids. TI Normal. 10 years screening  . ESOPHAGOGASTRODUODENOSCOPY  05/03/2011   [id number] gastritis  . NECK MASS EXCISION Right    "done in dr's office; behind right ear/side of ncek"  . POLYPECTOMY  11/21/2016   Procedure: POLYPECTOMY;  Surgeon: [name], MD;  Location: [facility];  Service: Endoscopy;;  descending colon polyp  . SHOULDER ARTHROSCOPY W/ ROTATOR CUFF REPAIR Right 2006   acromioclavicular joint arthrosis    Current Outpatient Medications  Medication Sig Dispense Refill  . acetaminophen (TYLENOL) 325 MG tablet Take 650 mg by mouth every 6 (six) hours as needed for pain or fever.     . ALPRAZolam (XANAX) 0.5 MG tablet Take one tablet by mouth three times a day and two tablets at night 150 tablet 2  . amLODipine (NORVASC) 10 MG tablet TAKE 1 Tablet BY MOUTH EVERY DAY 90 tablet 3  .  budesonide-formoterol (SYMBICORT) 80-4.5 MCG/ACT inhaler Inhale 2 puffs into the lungs 2 (two) times daily as needed.     . calcium carbonate (TUMS - DOSED IN MG ELEMENTAL CALCIUM) 500 MG chewable tablet Chew 1 tablet by mouth daily as needed for indigestion or heartburn.    . cyclobenzaprine (FLEXERIL) 10 MG tablet Take 1 tablet (10 mg total) by mouth 3 (three) times daily. (Patient taking differently: Take 10 mg by mouth 3 (three) times daily as needed for muscle spasms. ) 21 tablet 0  . esomeprazole (NEXIUM) 20 MG capsule Take 20 mg by mouth every morning.     [name] HYDROcodone-acetaminophen (NORCO/VICODIN) 5-325 MG tablet Take one to  two tablets every 4-6 hours as needed for pain 40 tablet 0  . metFORMIN (GLUCOPHAGE) 500 MG tablet Take by mouth daily with breakfast.    . metoprolol succinate (TOPROL-XL) 50 MG 24 hr tablet TAKE 1 AND A HALF TABLETS BY MOUTH EVERY DAY 135 tablet 3  . nitroGLYCERIN (NITROSTAT) 0.4 MG SL tablet Place 1 tablet (0.4 mg total) under the tongue every 5 (five) minutes x 3 doses as needed for chest pain (if no relief after 3rd dose, proceed to the ED for an evaluation). 25 tablet 3  . PROVENTIL HFA 108 (90 Base) MCG/ACT inhaler INHALE 2 PUFFS BY MOUTH EVERY 6 HOURS AS NEEDED FOR WHEEZING OR SHORTNESS OF BREATH 20.1 g 1  . simvastatin (ZOCOR) 20 MG tablet TAKE 1 TABLET BY MOUTH ONCE DAILY AT BEDTIME 30 tablet 3  . sitaGLIPtin (JANUVIA) 100 MG tablet Take 1 tablet (100 mg total) by mouth daily. 90 tablet 1  . XARELTO 20 MG TABS tablet TAKE 1 Tablet BY MOUTH ONCE DAILY WITH SUPPER 90 tablet 3   No current facility-administered medications for this visit.    Allergies as of 12/02/2019 - Review Complete 12/02/2019  Allergen Reaction Noted  . Dexilant [dexlansoprazole] Anaphylaxis 01/20/2015  . Mushroom ext cmplx(shiitake-reishi-mait) Anaphylaxis 03/22/2011  . Penicillins Anaphylaxis   . Doxycycline  04/18/2016    Family History  Problem Relation Age of Onset  . Lung cancer Mother   . Alcohol abuse Mother   . Heart attack Father 77  . Diabetes Father   . Alcohol abuse Father   . Hypertension Brother   . Hypertension Brother   . Anxiety disorder Sister   . Depression Sister   . Anxiety disorder Sister   . Heart attack Brother 17  . Diabetes Brother   . Hypertension Brother   . Seizures Brother   . Dementia Paternal Uncle   . Dementia Cousin   . ADD / ADHD Daughter   . Colon cancer Neg Hx   . Drug abuse Neg Hx   . Bipolar disorder Neg Hx   . OCD Neg Hx   . Paranoid behavior Neg Hx   . Schizophrenia Neg Hx   . Sexual abuse Neg Hx   . Physical abuse Neg Hx     Social History    Socioeconomic History  . Marital status: Married    Spouse name: Not on file  . Number of children: Not on file  . Years of education: Not on file  . Highest education level: Not on file  Occupational History  . Occupation: full time    Employer: UNEMPLOYED  Tobacco Use  . Smoking status: Current Every Day Smoker    Packs/day: 0.25    Years: 25.00    Pack years: 6.25    Types: Cigarettes    Start  date: 07/17/1982  . Smokeless tobacco: Never Used  . Tobacco comment: "smoking lightly"  Substance and Sexual Activity  . Alcohol use: No    Alcohol/week: 0.0 standard drinks  . Drug use: No  . Sexual activity: Yes    Birth control/protection: None  Other Topics Concern  . Not on file  Social History Narrative   Pt lives in [place] with wife.  5 children.  Unemployed due to panic attacks and back pain   Social Determinants of Health   Financial Resource Strain:   . Difficulty of Paying Living Expenses:   Food Insecurity:   . Worried About Charity fundraiser in the Last Year:   . Arboriculturist in the Last Year:   Transportation Needs:   . Film/video editor (Medical):   [name] Lack of Transportation (Non-Medical):   Physical Activity:   . Days of Exercise per Week:   . Minutes of Exercise per Session:   Stress:   . Feeling of Stress :   Social Connections:   . Frequency of Communication with Friends and Family:   . Frequency of Social Gatherings with Friends and Family:   . Attends Religious Services:   . Active Member of Clubs or Organizations:   . Attends Archivist Meetings:   [name] Marital Status:     Review of Systems: Gen: Denies fever, chills, anorexia. Denies fatigue, weakness, weight loss.  CV: Denies chest pain, palpitations, syncope, peripheral edema, and claudication. Resp: Denies dyspnea at rest, cough, wheezing, coughing up blood, and pleurisy. GI: see HPI Derm: Denies rash, itching, dry skin Psych: Denies depression, anxiety, memory  loss, confusion. No homicidal or suicidal ideation.  Heme: Denies bruising, bleeding, and enlarged lymph nodes.  Physical Exam: BP 132/80   Pulse (!) 59   Temp (!) 97.3 F (36.3 C) (Temporal)   Ht 5\' 11"  (1.803 m)   Wt 198 lb 6.4 oz (90 kg)   BMI 27.67 kg/m  General:   Alert and oriented. No distress noted. Pleasant and cooperative.  Head:  Normocephalic and atraumatic. Eyes:  Conjuctiva clear without scleral icterus. Mouth:  Mask in place Abdomen:  +BS, soft, non-tender and non-distended. No rebound or guarding. No HSM or masses noted. Msk:  Symmetrical without gross deformities. Normal posture. Extremities:  Without edema. Neurologic:  Alert and  oriented x4 Psych:  Alert and cooperative. Normal mood and affect.  ASSESSMENT: Richard [name] is a 54 y.o. male presenting today with history of chronic GERD, abdominal pain, now with worsening constipation.   Constipation: Amitiza with diarrhea. Will trial Linzess 145 mcg daily, as we will hopefully be able to utilize patient assistance with this.   GERD: Continue Nexium daily. Ideally may need to take BID but cost is an issue. Multiple PPIs tried in the past, with Nexium working the best.   Abdominal pain at baseline. Prior evaluation for biliary etiology negative ([name] 2017, HIDA). No alarm signs/symptoms.     PLAN:   Continue Nexium daily  Trial of Linzess 145 mcg daily. Call with progress report  Pepcid in evening prn  Return in 6 months  Annitta Needs, PhD, [facility]

## 2020-01-01 ENCOUNTER — Other Ambulatory Visit: Payer: Self-pay | Admitting: Physician Assistant

## 2020-02-02 ENCOUNTER — Other Ambulatory Visit: Payer: Self-pay

## 2020-02-02 ENCOUNTER — Other Ambulatory Visit: Payer: Self-pay | Admitting: Physician Assistant

## 2020-02-02 MED ORDER — METOPROLOL SUCCINATE ER 50 MG PO TB24: ORAL_TABLET | ORAL | 3 refills | Status: DC

## 2020-02-02 MED ORDER — AMLODIPINE BESYLATE 10 MG PO TABS: 10.0000 mg | ORAL_TABLET | Freq: Every day | ORAL | 3 refills | Status: DC

## 2020-02-02 MED ORDER — NITROGLYCERIN 0.4 MG SL SUBL: 0.4000 mg | SUBLINGUAL_TABLET | SUBLINGUAL | 3 refills | Status: DC | PRN

## 2020-02-02 NOTE — Telephone Encounter (Signed)
New message      *STAT* If patient is at the pharmacy, call can be transferred to refill team.   1. Which medications need to be refilled? (please list name of each medication and dose if known)  nitroGLYCERIN (NITROSTAT) 0.4 MG SL tablet Place 1 tablet (0.4 mg total) under the tongue every 5 (five) minutes x 3 doses as needed for chest pain (if no relief after 3rd dose, proceed to the ED for an evaluation).   Xarelto ?   amLODipine (NORVASC) 10 MG tablet TAKE 1 Tablet BY MOUTH EVERY DAY     2. Which pharmacy/location (including street and city if local pharmacy) is medication to be sent to? [facility]   3. Do they need a 30 day or 90 day supply? 30   *STAT* If patient is at the pharmacy, call can be transferred to refill team.   1. Which medications need to be refilled? (please list name of each medication and dose if known)  metoprolol succinate (TOPROL-XL) 50 MG 24 hr tablet TAKE 1 AND A HALF TABLETS BY MOUTH EVERY DAY    2. Which pharmacy/location (including street and city if local pharmacy) is medication to be sent to? [facility] [place]   3. Do they need a 30 day or 90 day supply? [place]

## 2020-02-02 NOTE — Telephone Encounter (Signed)
[facility] to [facility], amlodipine and NTG to [facility]

## 2020-02-08 NOTE — Progress Notes (Addendum)
Cardiology Office Note  Date: 02/10/2020   ID: Richard, [name] 07-10-1966, MRN [id number]  PCP:  Richard Dryer, PA-C  Cardiologist:  No primary care provider on file. Electrophysiologist:  None   Chief Complaint: Paroxysmal atrial fibrillation  History of Present Illness: Richard [name] is a 54 y.o. male with a history of paroxysmal atrial fibrillation, mild CAD, essential hypertension, HTN, HLD, COPD, GERD, type 2 diabetes, CVA/TIA, tobacco abuse.  Saw Dr. Thompson [name] via telemedicine on 04/16/2019.  Patient stated he had been doing reasonably well.  Recently had a vist to the emergency room with AF 03/28/2019.  He reported episodes of atrial fibrillation episodes which were ongoing lasting hours.  Stated he was very symptomatic and positive for tachypalpitations and chest pain.  The most recent episode happened after a joint injection for elbow pain.  At visit he denies any symptoms of shortness of breath, LE edema, dizziness, presyncope or syncope.  Patient stated he felt episodes were better by watching what he ate.  He continued on Xarelto and metoprolol.  Dr. Rayann [name] stated ablation would be the best option for him.   He presents today with complaints of increasing issues with dyspnea on exertion,.  States that exertional effort induces his palpitations/atrial fibrillation.  States he has a needlelike sensation in the left precordial chest area which occurs on and off without associated radiation, or other associated nausea, vomiting, or diaphoresis.  He continues to smoke but states he is cutting down.  He states yesterday he was trying to help his son work on his car and the heat and humidity caused him to go into atrial fibrillation and he was having significant exertional fatigue.  States he can barely go outside without having issues with increased episodes of atrial fibrillation and increasing dyspnea on exertion.  EKG today shows sinus rhythm with occasional premature  ventricular complexes rate of 62, nonspecific T wave abnormality.   Past Medical History:  Diagnosis Date  . Arthritis    "legs, spine, shoulders" (08/12/2015)  . Asthma   . Colitis 1990  . COPD (chronic obstructive pulmonary disease) ([place])   . Depression   . Gastric ulcer 2003; 2012   2003: + esophagitis; negative H.pylori serology  2012: Dr. Oneida Alar, mild gastritis, Bravo PH probe placement, negative H.pylori  . GERD (gastroesophageal reflux disease)   . Hepatic steatosis   . History of hiatal hernia   . Hyperlipemia   . Hypertension   . Mild CAD    a. Cardiac cath 07/2015 showed 65% distal Cx, 20% mid-distal LAD, 20% prox-distal RCA, EF 60%, EDP 35mmHg.  [name] Overweight   . Panic attacks   . Paroxysmal atrial fibrillation (HCC)   . Stroke [facility])    mucsle in left side of face dosent work as its supposed to-only deficit.  [name] TIA (transient ischemic attack)    after cath 1/ 17  . Tobacco abuse    1/2 pack per day  . Type II diabetes mellitus ([place])     Past Surgical History:  Procedure Laterality Date  . BRAVO [place] STUDY  05/03/2011   RSW:NIOE gastritis/normal esophagus and duodenum  . CARDIAC CATHETERIZATION  1990s X 1; 2005; 08/12/2015  . CARDIAC CATHETERIZATION N/A 08/12/2015   Procedure: Left Heart Cath and Coronary Angiography;  Surgeon: Belva Crome, MD; LAD 20%, CFX 65%, RCA 20%, EF 60%   . COLONOSCOPY  1990  . COLONOSCOPY WITH PROPOFOL N/A 11/21/2016   Dr. Oneida Alar: non-thrombosed external hemorrhoids, one  6 mm polyp (polypoid lesion), internal hemorrhoids. TI Normal. 10 years screening  . ESOPHAGOGASTRODUODENOSCOPY  05/03/2011   JKD:TOIZ gastritis  . NECK MASS EXCISION Right    "done in dr's office; behind right ear/side of ncek"  . POLYPECTOMY  11/21/2016   Procedure: POLYPECTOMY;  Surgeon: [name], MD;  Location: [facility] ENDO SUITE;  Service: Endoscopy;;  descending colon polyp  . SHOULDER ARTHROSCOPY W/ ROTATOR CUFF REPAIR Right 2006   acromioclavicular joint arthrosis      Current Outpatient Medications  Medication Sig Dispense Refill  . acetaminophen (TYLENOL) 325 MG tablet Take 650 mg by mouth every 6 (six) hours as needed for pain or fever.     . ALPRAZolam (XANAX) 0.5 MG tablet Take one tablet by mouth three times a day and two tablets at night 150 tablet 2  . amLODipine (NORVASC) 10 MG tablet Take 1 tablet (10 mg total) by mouth daily. 90 tablet 3  . budesonide-formoterol (SYMBICORT) 80-4.5 MCG/ACT inhaler Inhale 2 puffs into the lungs 2 (two) times daily as needed.     . calcium carbonate (TUMS - DOSED IN MG ELEMENTAL CALCIUM) 500 MG chewable tablet Chew 1 tablet by mouth daily as needed for indigestion or heartburn.    . cyclobenzaprine (FLEXERIL) 10 MG tablet Take 1 tablet (10 mg total) by mouth 3 (three) times daily. 21 tablet 0  . esomeprazole (NEXIUM) 20 MG capsule Take 20 mg by mouth every morning.     [name] HYDROcodone-acetaminophen (NORCO/VICODIN) 5-325 MG tablet Take one to two tablets every 4-6 hours as needed for pain 40 tablet 0  . JANUVIA 100 MG tablet TAKE 1 Tablet BY MOUTH ONCE EVERY DAY 90 tablet 0  . metoprolol succinate (TOPROL-XL) 50 MG 24 hr tablet Take 1 and 1/2 tabs by mouth  with or immediately following a meal. 135 tablet 3  . nitroGLYCERIN (NITROSTAT) 0.4 MG SL tablet Place 1 tablet (0.4 mg total) under the tongue every 5 (five) minutes x 3 doses as needed for chest pain (if no relief after 3rd dose, proceed to the ED for an evaluation). 25 tablet 3  . PROVENTIL HFA 108 (90 Base) MCG/ACT inhaler INHALE 2 PUFFS BY MOUTH EVERY 6 HOURS AS NEEDED FOR COUGHING, WHEEZING, OR SHORTNESS OF BREATH 20.1 g 1  . simvastatin (ZOCOR) 20 MG tablet TAKE 1 TABLET BY MOUTH ONCE DAILY AT BEDTIME 30 tablet 3  . XARELTO 20 MG TABS tablet TAKE 1 Tablet BY MOUTH ONCE DAILY WITH SUPPER 90 tablet 3   No current facility-administered medications for this visit.   Allergies:  Dexilant [dexlansoprazole], Mushroom ext cmplx(shiitake-reishi-mait), Penicillins,  and Doxycycline   Social History: The patient  reports that he has been smoking cigarettes. He started smoking about 37 years ago. He has a 6.25 pack-year smoking history. He has never used smokeless tobacco. He reports that he does not drink alcohol and does not use drugs.   Family History: The patient's family history includes ADD / ADHD in his daughter; Alcohol abuse in his father and mother; Anxiety disorder in his sister and sister; Dementia in his cousin and paternal uncle; Depression in his sister; Diabetes in his brother and father; Heart attack (age of onset: 27) in his brother; Heart attack (age of onset: 16) in his father; Hypertension in his brother, brother, and brother; Lung cancer in his mother; Seizures in his brother.   ROS:  Please see the history of present illness. Otherwise, complete review of systems is positive for none.  All other systems are reviewed and negative.   Physical Exam: VS:  BP (!) 138/72   Pulse 62   Ht 5\' 11"  (1.803 m)   Wt 193 lb (87.5 kg)   SpO2 97%   BMI 26.92 kg/m , BMI Body mass index is 26.92 kg/m.  Wt Readings from Last 3 Encounters:  02/09/20 193 lb (87.5 kg)  12/02/19 198 lb 6.4 oz (90 kg)  06/04/19 196 lb 12.8 oz (89.3 kg)    General: Patient appears comfortable at rest. Neck: Supple, no elevated JVP or carotid bruits, no thyromegaly. Lungs: Clear to auscultation, nonlabored breathing at rest. Cardiac: Regular rate and rhythm, no S3 or significant systolic murmur, no pericardial rub. Extremities: No pitting edema, distal pulses 2+. Skin: Warm and dry. Musculoskeletal: No kyphosis. Neuropsychiatric: Alert and oriented x3, affect grossly appropriate.  ECG:  An ECG dated 02/09/2020 was personally reviewed today and demonstrated:  Sinus rhythm with occasional premature ventricular complexes rate of 62, nonspecific T wave abnormality.  Recent Labwork: 03/28/2019: Hemoglobin 16.2; Platelets 198 11/25/2019: ALT 13; AST 12; BUN 14; Creatinine,  Ser 0.85; Potassium 3.3; Sodium 139     Component Value Date/Time   CHOL 129 11/25/2019 0938   TRIG 99 11/25/2019 0938   HDL 34 (L) 11/25/2019 0938   CHOLHDL 3.8 11/25/2019 0938   VLDL 20 11/25/2019 0938   LDLCALC 75 11/25/2019 0938    Other Studies Reviewed Today:   Assessment and Plan:  1. Paroxysmal atrial fibrillation (HCC)   2. Essential hypertension   3. CAD in native artery   4. SOB (shortness of breath)   5. Mixed hyperlipidemia    1. Paroxysmal atrial fibrillation [facility]) Patient states she has been having episodes of atrial fibrillation mostly induced by exertional activities and being outside in the heat.  EKG today shows sinus rhythm with occasional premature ventricular complexes rate of 62, nonspecific T wave abnormality.  States sometimes when he reclines in a certain position this induces the atrial fibrillation.  States at nighttime he seems to notice the atrial fibrillation more.  He was advised previously to take the amlodipine in the morning and the Toprol at night to help with the nighttime palpitations.  He states he has not been taking the medication in manner.  Encouraged him to start taking the amlodipine 10 mg in a.m. and Toprol XL 50 mg in p.m. to help with nighttime palpitations.  Continue Xarelto 20 mg daily  2. Essential hypertension Blood pressure is slightly elevated today with a blood pressure of 138/72.  Patient states his blood pressures at home are usually within normal limits usually in the one twenties over sixties.  Continue amlodipine 10 mg daily.  Continue Toprol-XL 50 mg daily.  3. CAD in native artery Denies any classic anginal symptoms other than above-mentioned needlelike sensation in his left precordial area which comes and goes.  Denies any chest pressure/chest tightness/diffuse chest pain which radiates.  Denies any associated nausea, vomiting, or diaphoresis.  Continue nitroglycerin sublingual nitro as needed for chest pain.  Continue  simvastatin 20 mg daily.  4.  Shortness of breath. Patient said complaining of increasing shortness of breath during exertional activities.  States it seems to be progressing over the last few months.  Advised smoking cessation.  Please get a repeat echocardiogram to assess LV function, diastolic function and valvular function.  5. HLD Lipids 11/25/2019 TC 129, TG 99, HDL 34, LDL 75. Continue  Simvastatin 20 mg daily  Medication Adjustments/Labs and Tests Ordered:  Current medicines are reviewed at length with the patient today.  Concerns regarding medicines are outlined above.   Disposition: Follow-up with [name] or APP 3 months.  Signed, Levell July, NP 02/10/2020 6:39 AM    [facility], [place], [place] [address] Phone: [phone]; Fax: [phone]

## 2020-02-09 ENCOUNTER — Other Ambulatory Visit: Payer: Self-pay | Admitting: Physician Assistant

## 2020-02-09 ENCOUNTER — Encounter: Payer: Self-pay | Admitting: Family Medicine

## 2020-02-09 ENCOUNTER — Telehealth: Payer: Self-pay | Admitting: Family Medicine

## 2020-02-09 ENCOUNTER — Other Ambulatory Visit: Payer: Self-pay

## 2020-02-09 ENCOUNTER — Ambulatory Visit (INDEPENDENT_AMBULATORY_CARE_PROVIDER_SITE_OTHER): Payer: Self-pay | Admitting: Family Medicine

## 2020-02-09 VITALS — BP 138/72 | HR 62 | Ht 71.0 in | Wt 193.0 lb

## 2020-02-09 DIAGNOSIS — E782 Mixed hyperlipidemia: Secondary | ICD-10-CM

## 2020-02-09 DIAGNOSIS — I10 Essential (primary) hypertension: Secondary | ICD-10-CM

## 2020-02-09 DIAGNOSIS — I480 Paroxysmal atrial fibrillation: Secondary | ICD-10-CM

## 2020-02-09 DIAGNOSIS — I2510 Atherosclerotic heart disease of native coronary artery without angina pectoris: Secondary | ICD-10-CM

## 2020-02-09 DIAGNOSIS — R0602 Shortness of breath: Secondary | ICD-10-CM

## 2020-02-09 NOTE — Telephone Encounter (Signed)
Patient says he was told that his simvastatin was being decreased to 10 mg during visit today.

## 2020-02-09 NOTE — Telephone Encounter (Signed)
Patient called stating that he has a question about Simvastatin 20mg . Was the RX to be for 10mg ?

## 2020-02-09 NOTE — Patient Instructions (Addendum)
Medication Instructions:   Your physician recommends that you continue on your current medications as directed. Please refer to the Current Medication list given to you today. *If you need a refill on your cardiac medications before your next appointment, please call your pharmacy*   Lab Work:  NONE If you have labs (blood work) drawn today and your tests are completely normal, you will receive your results only by: [name] MyChart Message (if you have MyChart) OR . A paper copy in the mail If you have any lab test that is abnormal or we need to change your treatment, we will call you to review the results.   Testing/Procedures: Your physician has requested that you have an echocardiogram. Echocardiography is a painless test that uses sound waves to create images of your heart. It provides your doctor with information about the size and shape of your heart and how well your heart's chambers and valves are working. This procedure takes approximately one hour. There are no restrictions for this procedure.   Follow-Up: At [facility], you and your health needs are our priority.  As part of our continuing mission to provide you with exceptional heart care, we have created designated Provider Care Teams.  These Care Teams include your primary Cardiologist (physician) and Advanced Practice Providers (APPs -  Physician Assistants and Nurse Practitioners) who all work together to provide you with the care you need, when you need it.  We recommend signing up for the patient portal called "MyChart".  Sign up information is provided on this After Visit Summary.  MyChart is used to connect with patients for Virtual Visits (Telemedicine).  Patients are able to view lab/test results, encounter notes, upcoming appointments, etc.  Non-urgent messages can be sent to your provider as well.   To learn more about what you can do with MyChart, go to [email or web address].    Your next appointment:    3  months  The format for your next appointment:    In Person  Provider:   You may see No primary care provider on file. or the following Advanced Practice Provider on your designated Care Team:    Katina Dung, NP

## 2020-02-10 NOTE — Telephone Encounter (Signed)
Wife informed Wife said that patient c/o muscle pain and was told that he could decrease simvastatin to 10 mg

## 2020-02-10 NOTE — Telephone Encounter (Signed)
I dont recall telling him that. In fact he has both CAD and Diabetes. (Two big risk factors) for progression of heart disease not to mention his smoking. His goal LDL should be less than 70. His most recent LDL was 75. Although it is close to goal it is not there yet. He is to continue current dose of statin. Tell him to stop smoking and that will help improve LDL. Smoking contributes to plaque deposition in arteries.

## 2020-02-11 ENCOUNTER — Other Ambulatory Visit: Payer: Self-pay | Admitting: *Deleted

## 2020-02-11 MED ORDER — XARELTO 20 MG PO TABS: 20.0000 mg | ORAL_TABLET | Freq: Every day | ORAL | 3 refills | Status: DC

## 2020-02-11 NOTE — Telephone Encounter (Signed)
Wife advised to break 20 mg tablet in half daily to see if symptoms improve. Advised to notify our office if he see's improvement. Verbalized understanding.

## 2020-02-11 NOTE — Telephone Encounter (Signed)
Yes decrease the medication dose in half. Try it for a month. If muscle pain does not go away, I would advise restarting given his multiple risk factors. By that that time we he should be able to tell if medicine is causing pain. If pain goes away keep the dosage at 10 mg .

## 2020-02-11 NOTE — Telephone Encounter (Signed)
Richard [name]- wife called wanting to know if patient's Simvastatin is going to be decreased to 10mg 

## 2020-02-24 ENCOUNTER — Ambulatory Visit (INDEPENDENT_AMBULATORY_CARE_PROVIDER_SITE_OTHER): Payer: Self-pay

## 2020-02-24 DIAGNOSIS — R0602 Shortness of breath: Secondary | ICD-10-CM

## 2020-02-24 LAB — ECHOCARDIOGRAM COMPLETE
Area-P 1/2: 2.84 cm2
Calc EF: 69.4 %
MV M vel: 1.94 m/s
MV Peak grad: 15.1 mmHg
S' Lateral: 2.83 cm
Single Plane A2C EF: 73.9 %
Single Plane A4C EF: 65.3 %

## 2020-02-25 ENCOUNTER — Telehealth: Payer: Self-pay | Admitting: *Deleted

## 2020-02-25 NOTE — Telephone Encounter (Signed)
-----   Message from Verta Ellen., NP sent at 02/25/2020  2:24 PM EDT ----- Please call the patient and let him know the echocardiogram showed the pumping function of his heart looks good. No leaky valves. Nothing in his heart structure or function that would explain shortness of breath. Encourage smoking cessation.

## 2020-02-25 NOTE — Telephone Encounter (Signed)
Pt aware - routed to pcp  

## 2020-03-03 ENCOUNTER — Telehealth: Payer: Self-pay | Admitting: Gastroenterology

## 2020-03-03 NOTE — Telephone Encounter (Signed)
Coupons are ready for pickup. We don't have any samples of Nexium. Spouse is aware.

## 2020-03-03 NOTE — Telephone Encounter (Signed)
Patient wife called and requested nexium coupons and/or samples

## 2020-03-16 ENCOUNTER — Other Ambulatory Visit: Payer: Self-pay | Admitting: Physician Assistant

## 2020-03-16 DIAGNOSIS — E118 Type 2 diabetes mellitus with unspecified complications: Secondary | ICD-10-CM

## 2020-03-16 DIAGNOSIS — E876 Hypokalemia: Secondary | ICD-10-CM

## 2020-03-16 DIAGNOSIS — I10 Essential (primary) hypertension: Secondary | ICD-10-CM

## 2020-03-16 DIAGNOSIS — Z125 Encounter for screening for malignant neoplasm of prostate: Secondary | ICD-10-CM

## 2020-03-16 DIAGNOSIS — E785 Hyperlipidemia, unspecified: Secondary | ICD-10-CM

## 2020-03-17 ENCOUNTER — Telehealth: Payer: Self-pay | Admitting: Internal Medicine

## 2020-03-17 NOTE — Telephone Encounter (Signed)
PATIENT WIFE CALLED AND SAID THAT THE [employer] WORKED AND NEEDS TO TRY AND GET IT THROUGH THE COMPANY DUE TO THE PRICE [phone]

## 2020-03-17 NOTE — Telephone Encounter (Signed)
Paperwork for pt assistance is ready for pick up. Pts spouse will pick up.

## 2020-03-18 ENCOUNTER — Ambulatory Visit (INDEPENDENT_AMBULATORY_CARE_PROVIDER_SITE_OTHER): Payer: Self-pay

## 2020-03-18 ENCOUNTER — Encounter: Payer: Self-pay | Admitting: Orthopaedic Surgery

## 2020-03-18 ENCOUNTER — Ambulatory Visit (INDEPENDENT_AMBULATORY_CARE_PROVIDER_SITE_OTHER): Payer: Self-pay | Admitting: Orthopaedic Surgery

## 2020-03-18 ENCOUNTER — Other Ambulatory Visit: Payer: Self-pay

## 2020-03-18 VITALS — Ht 71.0 in | Wt 192.0 lb

## 2020-03-18 DIAGNOSIS — M25512 Pain in left shoulder: Secondary | ICD-10-CM

## 2020-03-18 DIAGNOSIS — M542 Cervicalgia: Secondary | ICD-10-CM

## 2020-03-18 NOTE — Progress Notes (Signed)
Office Visit Note   Patient: Richard [name]           Date of Birth: 1965-11-05           MRN: [id number] Visit Date: 03/18/2020              Requested by: [name], PA-C [address] [place],  [place] [address] PCP: [name], PA-C   Assessment & Plan: Visit Diagnoses:  1. Neck pain   2. Acute pain of left shoulder     Plan: Patient has some mid cervical spondylosis and also some impingement in the shoulder.  Symptoms are not severe enough to consider a subacromial injection.  We discussed using some Aspercreme avoid outstretched and overhead reaching activities.  He will return if he has increased symptoms.  Follow-Up Instructions: No follow-ups on file.   Orders:  Orders Placed This Encounter  Procedures  . XR Cervical Spine 2 or 3 views  . XR Shoulder Left   No orders of the defined types were placed in this encounter.     Procedures: No procedures performed   Clinical Data: No additional findings.   Subjective: Chief Complaint  Patient presents with  . Left Shoulder - Pain  . Neck - Pain    HPI 54 year old male seen with left shoulder pain present x2 to 3 months.  He states he has not done anything side normal household chores does not recall an injury to the shoulder.  He states he has some numbness in his arms in the morning and during the day has some increased gradual pain in his shoulder with decreased range of motion.  Occasionally has had neck pain associated with this.  He is try to avoid medications that may bother his fatty liver problem.  Review of Systems 14 point systems are noncontributory.  Does have type 2 diabetes coronary artery disease hypertension anxiety and depression and COPD, cigarette smoking of note.   Objective: Vital Signs: Ht 5\' 11"  (1.803 m)   Wt 192 lb (87.1 kg)   BMI 26.78 kg/m   Physical Exam Constitutional:      Appearance: He is well-developed.  HENT:     Head: Normocephalic and atraumatic.  Eyes:       Pupils: Pupils are equal, round, and reactive to light.  Neck:     Thyroid: No thyromegaly.     Trachea: No tracheal deviation.  Cardiovascular:     Rate and Rhythm: Normal rate.  Pulmonary:     Effort: Pulmonary effort is normal.     Breath sounds: No wheezing.  Abdominal:     General: Bowel sounds are normal.     Palpations: Abdomen is soft.  Skin:    General: Skin is warm and dry.     Capillary Refill: Capillary refill takes less than 2 seconds.  Neurological:     Mental Status: He is alert and oriented to person, place, and time.  Psychiatric:        Behavior: Behavior normal.        Thought Content: Thought content normal.        Judgment: Judgment normal.     Ortho Exam patient does have some discomfort with impingement test left shoulder negative for the right shoulder negative drop arm test.  No evidence adhesive capsulitis with good range of motion passively.  Some brachial plexus tenderness negative Spurling.  Upper extremity reflexes are 2+ and symmetrical.  Specialty Comments:  No specialty comments available.  Imaging:  XR Cervical Spine 2 or 3 views  Result Date: 03/18/2020 AP lateral cervical spine x-rays demonstrate some disc base narrowing and spurring at C4-5 greater than C5-6.  Normal cervical lordosis no spondylolisthesis. Impression: Mild mid cervical spondylosis.  XR Shoulder Left  Result Date: 03/18/2020 None.  2 view x-rays left shoulder obtained and reviewed this shows no glenohumeral arthritis humeral head is well located.  Patient does have some mild acromioclavicular degenerative changes. Impression normal glenohumeral joint mild acromioclavicular degenerative changes.    PMFS History: Patient Active Problem List   Diagnosis Date Noted  . Lateral epicondylitis, left elbow 03/27/2019  . Globus sensation 02/21/2018  . Other cervical disc degeneration, unspecified cervical region 01/11/2018  . Tendinitis of right triceps 01/11/2018  . Neck  pain 12/27/2017  . Lateral epicondylitis, right elbow 05/10/2017  . Constipation 12/24/2015  . Dyspnea 11/17/2015  . Coronary artery disease due to lipid rich plaque   . Heart palpitations 08/12/2015  . TIA (transient ischemic attack) 08/12/2015  . Colon cancer screening 08/02/2015  . Abdominal pain 12/18/2014  . Encounter for screening colonoscopy 12/18/2014  . Unspecified vitamin D deficiency 08/20/2012  . Arteriosclerotic cardiovascular disease (ASCVD) 04/11/2012  . Chronic low back pain   . Essential hypertension   . Anxiety and depression   . Controlled diabetes mellitus type 2 with complications ([place]) 56/21/3086  . Cigarette smoker 09/14/2010  . CHRONIC OBSTRUCTIVE PULMONARY DISEASE 09/14/2010  . HLD (hyperlipidemia) 11/25/2009  . GASTROESOPHAGEAL REFLUX DISEASE 04/05/2009  . Hepatic steatosis 04/05/2009   Past Medical History:  Diagnosis Date  . Arthritis    "legs, spine, shoulders" (08/12/2015)  . Asthma   . Colitis 1990  . COPD (chronic obstructive pulmonary disease) ([place])   . Depression   . Gastric ulcer 2003; 2012   2003: + esophagitis; negative H.pylori serology  2012: Dr. Oneida Alar, mild gastritis, Bravo PH probe placement, negative H.pylori  . GERD (gastroesophageal reflux disease)   . Hepatic steatosis   . History of hiatal hernia   . Hyperlipemia   . Hypertension   . Mild CAD    a. Cardiac cath 07/2015 showed 65% distal Cx, 20% mid-distal LAD, 20% prox-distal RCA, EF 60%, EDP 79mmHg.  [name] Overweight   . Panic attacks   . Paroxysmal atrial fibrillation (HCC)   . Stroke [facility])    mucsle in left side of face dosent work as its supposed to-only deficit.  [name] TIA (transient ischemic attack)    after cath 1/ 17  . Tobacco abuse    1/2 pack per day  . Type II diabetes mellitus (HCC)     Family History  Problem Relation Age of Onset  . Lung cancer Mother   . Alcohol abuse Mother   . Heart attack Father 31  . Diabetes Father   . Alcohol abuse Father   .  Hypertension Brother   . Hypertension Brother   . Anxiety disorder Sister   . Depression Sister   . Anxiety disorder Sister   . Heart attack Brother 57  . Diabetes Brother   . Hypertension Brother   . Seizures Brother   . Dementia Paternal Uncle   . Dementia Cousin   . ADD / ADHD Daughter   . Colon cancer Neg Hx   . Drug abuse Neg Hx   . Bipolar disorder Neg Hx   . OCD Neg Hx   . Paranoid behavior Neg Hx   . Schizophrenia Neg Hx   . Sexual abuse Neg Hx   .  Physical abuse Neg Hx     Past Surgical History:  Procedure Laterality Date  . BRAVO [place] STUDY  05/03/2011   BLT:[id number] gastritis/normal esophagus and duodenum  . CARDIAC CATHETERIZATION  1990s X 1; 2005; 08/12/2015  . CARDIAC CATHETERIZATION N/A 08/12/2015   Procedure: Left Heart Cath and Coronary Angiography;  Surgeon: Belva Crome, MD; LAD 20%, CFX 65%, RCA 20%, EF 60%   . COLONOSCOPY  1990  . COLONOSCOPY WITH PROPOFOL N/A 11/21/2016   Dr. Oneida Alar: non-thrombosed external hemorrhoids, one 6 mm polyp (polypoid lesion), internal hemorrhoids. TI Normal. 10 years screening  . ESOPHAGOGASTRODUODENOSCOPY  05/03/2011   SPQ:ZRAQ gastritis  . NECK MASS EXCISION Right    "done in dr's office; behind right ear/side of ncek"  . POLYPECTOMY  11/21/2016   Procedure: POLYPECTOMY;  Surgeon: [name], MD;  Location: [facility] ENDO SUITE;  Service: Endoscopy;;  descending colon polyp  . SHOULDER ARTHROSCOPY W/ ROTATOR CUFF REPAIR Right 2006   acromioclavicular joint arthrosis   Social History   Occupational History  . Occupation: full time    Employer: UNEMPLOYED  Tobacco Use  . Smoking status: Current Every Day Smoker    Packs/day: 0.25    Years: 25.00    Pack years: 6.25    Types: Cigarettes    Start date: 07/17/1982  . Smokeless tobacco: Never Used  . Tobacco comment: "smoking lightly"  Vaping Use  . Vaping Use: Never used  Substance and Sexual Activity  . Alcohol use: No    Alcohol/week: 0.0 standard drinks  . Drug use: No  .  Sexual activity: Yes    Birth control/protection: None

## 2020-03-29 ENCOUNTER — Ambulatory Visit: Payer: Self-pay | Admitting: Physician Assistant

## 2020-03-31 ENCOUNTER — Other Ambulatory Visit (HOSPITAL_COMMUNITY)
Admission: RE | Admit: 2020-03-31 | Discharge: 2020-03-31 | Disposition: A | Discharge status: Home / Self Care | Payer: Self-pay | Source: Ambulatory Visit | Attending: Physician Assistant | Admitting: Physician Assistant

## 2020-03-31 ENCOUNTER — Other Ambulatory Visit: Payer: Self-pay

## 2020-03-31 DIAGNOSIS — E118 Type 2 diabetes mellitus with unspecified complications: Secondary | ICD-10-CM | POA: Insufficient documentation

## 2020-03-31 DIAGNOSIS — E876 Hypokalemia: Secondary | ICD-10-CM | POA: Insufficient documentation

## 2020-03-31 DIAGNOSIS — I10 Essential (primary) hypertension: Secondary | ICD-10-CM | POA: Insufficient documentation

## 2020-03-31 DIAGNOSIS — E785 Hyperlipidemia, unspecified: Secondary | ICD-10-CM | POA: Insufficient documentation

## 2020-03-31 DIAGNOSIS — Z125 Encounter for screening for malignant neoplasm of prostate: Secondary | ICD-10-CM | POA: Insufficient documentation

## 2020-03-31 LAB — HEMOGLOBIN A1C
Hgb A1c MFr Bld: 6.5 % — ABNORMAL HIGH (ref 4.8–5.6)
Mean Plasma Glucose: 139.85 mg/dL

## 2020-03-31 LAB — COMPREHENSIVE METABOLIC PANEL
ALT: 16 U/L (ref 0–44)
AST: 12 U/L — ABNORMAL LOW (ref 15–41)
Albumin: 4.1 g/dL (ref 3.5–5.0)
Alkaline Phosphatase: 45 U/L (ref 38–126)
Anion gap: 9 (ref 5–15)
BUN: 16 mg/dL (ref 6–20)
CO2: 26 mmol/L (ref 22–32)
Calcium: 9.1 mg/dL (ref 8.9–10.3)
Chloride: 101 mmol/L (ref 98–111)
Creatinine, Ser: 1.01 mg/dL (ref 0.61–1.24)
GFR calc Af Amer: >60 mL/min (ref >60)
GFR calc non Af Amer: >60 mL/min (ref >60)
Glucose, Bld: 145 mg/dL — ABNORMAL HIGH (ref 70–99)
Potassium: 4 mmol/L (ref 3.5–5.1)
Sodium: 136 mmol/L (ref 135–145)
Total Bilirubin: 0.5 mg/dL (ref 0.3–1.2)
Total Protein: 7.2 g/dL (ref 6.5–8.1)

## 2020-03-31 LAB — LIPID PANEL
Cholesterol: 126 mg/dL (ref 0–200)
HDL: 32 mg/dL — ABNORMAL LOW (ref >40)
LDL Cholesterol: 75 mg/dL (ref 0–99)
Total CHOL/HDL Ratio: 3.9 RATIO
Triglycerides: 95 mg/dL (ref <150)
VLDL: 19 mg/dL (ref 0–40)

## 2020-03-31 LAB — PSA: Prostatic Specific Antigen: 0.43 ng/mL (ref 0.00–4.00)

## 2020-04-01 ENCOUNTER — Ambulatory Visit: Payer: Self-pay | Admitting: Physician Assistant

## 2020-04-01 ENCOUNTER — Telehealth: Payer: Self-pay

## 2020-04-01 DIAGNOSIS — E118 Type 2 diabetes mellitus with unspecified complications: Secondary | ICD-10-CM

## 2020-04-01 DIAGNOSIS — E785 Hyperlipidemia, unspecified: Secondary | ICD-10-CM

## 2020-04-01 DIAGNOSIS — I10 Essential (primary) hypertension: Secondary | ICD-10-CM

## 2020-04-01 DIAGNOSIS — J449 Chronic obstructive pulmonary disease, unspecified: Secondary | ICD-10-CM

## 2020-04-01 DIAGNOSIS — F17200 Nicotine dependence, unspecified, uncomplicated: Secondary | ICD-10-CM

## 2020-04-01 DIAGNOSIS — Z7901 Long term (current) use of anticoagulants: Secondary | ICD-10-CM

## 2020-04-01 DIAGNOSIS — F39 Unspecified mood [affective] disorder: Secondary | ICD-10-CM

## 2020-04-01 DIAGNOSIS — K219 Gastro-esophageal reflux disease without esophagitis: Secondary | ICD-10-CM

## 2020-04-01 LAB — MICROALBUMIN, URINE: Microalb, Ur: 14.7 ug/mL — ABNORMAL HIGH

## 2020-04-01 MED ORDER — ESOMEPRAZOLE MAGNESIUM 40 MG PO CPDR: 40.0000 mg | DELAYED_RELEASE_CAPSULE | Freq: Every day | ORAL | 3 refills | Status: DC

## 2020-04-01 NOTE — Progress Notes (Signed)
There were no vitals taken for this visit.   Subjective:    Patient ID: Richard [name], male    DOB: [birth year], 54 y.o.   MRN: [id number]  HPI: Richard [name] is a 54 y.o. male presenting on 04/01/2020 for No chief complaint on file.   HPI    This is a virtual appointment through [employer] due to coronavirus pandemic.  I connected with  Richard [name] on 04/01/20 by a video enabled telemedicine application and verified that I am speaking with the correct person using two identifiers.   I discussed the limitations of evaluation and management by telemedicine. The patient expressed understanding and agreed to proceed.  Pt is in his parked car.  Provider is at office.    Pt is 53yoM with DM, MH issues, HTN, dyslipidemia, COPD, gerd, long-term anticoaguation and  A fib.[name]    He has not been seen in person for too long but has previously refused to wear a mask so his appointments have been required to be virtual.  He says he is doing well and isn't having any new problems.  He saw orthopedist earlier this month.  He was seen by cardiology in July.  He was seen by GI in May.  He is continuing with MH treatment as well.   He is Not checking his bp at home  He says he is having some reflux at night.  He is currently using OTC nexium.    He says he is is now able to mask for up to 30 minutes now.     Relevant past medical, surgical, family and social history reviewed and updated as indicated. Interim medical history since our last visit reviewed. Allergies and medications reviewed and updated.   Current Outpatient Medications:  .  acetaminophen (TYLENOL) 325 MG tablet, Take 650 mg by mouth every 6 (six) hours as needed for pain or fever. , Disp: , Rfl:  .  ALPRAZolam (XANAX) 0.5 MG tablet, Take one tablet by mouth three times a day and two tablets at night, Disp: 150 tablet, Rfl: 2 .  amLODipine (NORVASC) 10 MG tablet, Take 1 tablet (10 mg total) by mouth daily., Disp: 90 tablet, Rfl:  3 .  budesonide-formoterol (SYMBICORT) 80-4.5 MCG/ACT inhaler, Inhale 2 puffs into the lungs 2 (two) times daily as needed. , Disp: , Rfl:  .  calcium carbonate (TUMS - DOSED IN MG ELEMENTAL CALCIUM) 500 MG chewable tablet, Chew 1 tablet by mouth daily as needed for indigestion or heartburn., Disp: , Rfl:  .  cyclobenzaprine (FLEXERIL) 10 MG tablet, Take 1 tablet (10 mg total) by mouth 3 (three) times daily., Disp: 21 tablet, Rfl: 0 .  esomeprazole (NEXIUM) 20 MG capsule, Take 20 mg by mouth every morning. , Disp: , Rfl:  .  HYDROcodone-acetaminophen (NORCO/VICODIN) 5-325 MG tablet, Take one to two tablets every 4-6 hours as needed for pain, Disp: 40 tablet, Rfl: 0 .  JANUVIA 100 MG tablet, TAKE 1 Tablet BY MOUTH ONCE EVERY DAY, Disp: 90 tablet, Rfl: 0 .  metoprolol succinate (TOPROL-XL) 50 MG 24 hr tablet, Take 1 and 1/2 tabs by mouth  with or immediately following a meal., Disp: 135 tablet, Rfl: 3 .  nitroGLYCERIN (NITROSTAT) 0.4 MG SL tablet, Place 1 tablet (0.4 mg total) under the tongue every 5 (five) minutes x 3 doses as needed for chest pain (if no relief after 3rd dose, proceed to the ED for an evaluation)., Disp: 25 tablet, Rfl: 3 .  PROVENTIL HFA 108 (90 Base) MCG/ACT inhaler, INHALE 2 PUFFS BY MOUTH EVERY 6 HOURS AS NEEDED FOR COUGHING, WHEEZING, OR SHORTNESS OF BREATH, Disp: 20.1 g, Rfl: 1 .  simvastatin (ZOCOR) 20 MG tablet, TAKE 1 TABLET BY MOUTH ONCE DAILY AT BEDTIME, Disp: 30 tablet, Rfl: 3 .  XARELTO 20 MG TABS tablet, Take 1 tablet (20 mg total) by mouth daily with supper., Disp: 90 tablet, Rfl: 3   Review of Systems  Per HPI unless specifically indicated above     Objective:    There were no vitals taken for this visit.  Wt Readings from Last 3 Encounters:  03/18/20 192 lb (87.1 kg)  02/09/20 193 lb (87.5 kg)  12/02/19 198 lb 6.4 oz (90 kg)    Physical Exam Constitutional:      General: He is not in acute distress.    Appearance: He is not ill-appearing.  HENT:      Head: Normocephalic and atraumatic.  Pulmonary:     Effort: No respiratory distress.  Neurological:     Mental Status: He is alert and oriented to person, place, and time.  Psychiatric:        Attention and Perception: Attention normal.        Speech: Speech normal.     Results for orders placed or performed during the hospital encounter of 03/31/20  PSA  Result Value Ref Range   Prostatic Specific Antigen 0.43 0.00 - 4.00 ng/mL  Lipid panel  Result Value Ref Range   Cholesterol 126 0 - 200 mg/dL   Triglycerides 95 <150 mg/dL   HDL 32 (L) >40 mg/dL   Total CHOL/HDL Ratio 3.9 RATIO   VLDL 19 0 - 40 mg/dL   LDL Cholesterol 75 0 - 99 mg/dL  Comprehensive metabolic panel  Result Value Ref Range   Sodium 136 135 - 145 mmol/L   Potassium 4.0 3.5 - 5.1 mmol/L   Chloride 101 98 - 111 mmol/L   CO2 26 22 - 32 mmol/L   Glucose, Bld 145 (H) 70 - 99 mg/dL   BUN 16 6 - 20 mg/dL   Creatinine, Ser 1.01 0.61 - 1.24 mg/dL   Calcium 9.1 8.9 - 10.3 mg/dL   Total Protein 7.2 6.5 - 8.1 g/dL   Albumin 4.1 3.5 - 5.0 g/dL   AST 12 (L) 15 - 41 U/L   ALT 16 0 - 44 U/L   Alkaline Phosphatase 45 38 - 126 U/L   Total Bilirubin 0.5 0.3 - 1.2 mg/dL   GFR calc non Af Amer >60 >60 mL/min   GFR calc Af Amer >60 >60 mL/min   Anion gap 9 5 - 15  Hemoglobin A1c  Result Value Ref Range   Hgb A1c MFr Bld 6.5 (H) 4.8 - 5.6 %   Mean Plasma Glucose 139.85 mg/dL      Assessment & Plan:   Encounter Diagnoses  Name Primary?  . Controlled diabetes mellitus type 2 with complications, unspecified whether long term insulin use ([place]) Yes  . Essential hypertension   . Hyperlipidemia, unspecified hyperlipidemia type   . Tobacco use disorder   . Chronic obstructive pulmonary disease, unspecified COPD type ([place])   . Gastroesophageal reflux disease, unspecified whether esophagitis present   . Mood disorder ([place])   . Anticoagulant long-term use       -reviewed labs with pt -Increase nexium.  Pt is counseled  to avoid spicy food and greasy foods.  He is encouraged to take his meds at  least 2 hour before bed.   -pt to continue other medications -pt is counseled and encouraged to get the covid vaccination -pt will be scheduled to follow up in office in 3 months.  Discussed with pt that he will be scheduled for in-office appointment since he is now able/willing to wear a mask for up to 30 minutes.  He is to contact office prior to that time if needed

## 2020-04-01 NOTE — Telephone Encounter (Signed)
Called pt. Samples of Linzess 145 mcg are available and ready for pickup.

## 2020-04-05 ENCOUNTER — Encounter: Payer: Self-pay | Admitting: Physician Assistant

## 2020-04-16 ENCOUNTER — Telehealth: Payer: Self-pay | Admitting: Internal Medicine

## 2020-04-16 NOTE — Telephone Encounter (Signed)
Routing message to another provider in the absence of [name], NP. Pt would like to try samples of Linzess 72 mcg due to feeling the Linzess 145 mcg is too strong.

## 2020-04-16 NOTE — Telephone Encounter (Signed)
PATIENT WIFE CALLED ASKING FOR NEXIUM COUPONS AND WANTS TO KNOW IF THE SAMPLES OF THE LINZESS THEY ARE TO PICK UP COMES IN A LOWER DOSAGE. PATIENT STOMACH BOTHERS HIM WHEN HE TAKES THEM

## 2020-04-16 NOTE — Telephone Encounter (Signed)
Ok to leave samples of Linzess 72 mcg at the front desk for pick-up. Dispense 4 boxes. Have patient call next week with update.

## 2020-04-16 NOTE — Telephone Encounter (Signed)
Noted. Spoke with pts spouse. Pt was notified that Linzess 72 mcg samples are ready for pickup.

## 2020-05-03 NOTE — Progress Notes (Addendum)
Cardiology Clinic Note   Patient Name: Richard [name] Date of Encounter: 05/05/2020  Primary Care Provider:  [name], PA-C Primary Cardiologist:  Rozann Lesches, MD  Patient Profile    Richard [name] 54 year old male presents to the clinic today for an evaluation of his chest pain/arm pain.  Past Medical History    Past Medical History:  Diagnosis Date  . Arthritis    "legs, spine, shoulders" (08/12/2015)  . Asthma   . Colitis 1990  . COPD (chronic obstructive pulmonary disease) ([place])   . Depression   . Gastric ulcer 2003; 2012   2003: + esophagitis; negative H.pylori serology  2012: Dr. Oneida Alar, mild gastritis, Bravo PH probe placement, negative H.pylori  . GERD (gastroesophageal reflux disease)   . Hepatic steatosis   . History of hiatal hernia   . Hyperlipemia   . Hypertension   . Mild CAD    a. Cardiac cath 07/2015 showed 65% distal Cx, 20% mid-distal LAD, 20% prox-distal RCA, EF 60%, EDP 60mmHg.  [name] Overweight   . Panic attacks   . Paroxysmal atrial fibrillation (H[facility])   . Stroke [facility])    mucsle in left side of face dosent work as its supposed to-only deficit.  [name] TIA (transient ischemic attack)    after cath 1/ 17  . Tobacco abuse    1/2 pack per day  . Type II diabetes mellitus ([place])    Past Surgical History:  Procedure Laterality Date  . BRAVO [place] STUDY  05/03/2011   HBZ:JIRC gastritis/normal esophagus and duodenum  . CARDIAC CATHETERIZATION  1990s X 1; 2005; 08/12/2015  . CARDIAC CATHETERIZATION N/A 08/12/2015   Procedure: Left Heart Cath and Coronary Angiography;  Surgeon: Belva Crome, MD; LAD 20%, CFX 65%, RCA 20%, EF 60%   . COLONOSCOPY  1990  . COLONOSCOPY WITH PROPOFOL N/A 11/21/2016   Dr. Oneida Alar: non-thrombosed external hemorrhoids, one 6 mm polyp (polypoid lesion), internal hemorrhoids. TI Normal. 10 years screening  . ESOPHAGOGASTRODUODENOSCOPY  05/03/2011   VEL:FYBO gastritis  . NECK MASS EXCISION Right    "done in dr's office; behind  right ear/side of ncek"  . POLYPECTOMY  11/21/2016   Procedure: POLYPECTOMY;  Surgeon: [name], MD;  Location: [facility];  Service: Endoscopy;;  descending colon polyp  . SHOULDER ARTHROSCOPY W/ ROTATOR CUFF REPAIR Right 2006   acromioclavicular joint arthrosis    Allergies  Allergies  Allergen Reactions  . Dexilant [Dexlansoprazole] Anaphylaxis  . Mushroom Ext Cmplx(Shiitake-Reishi-Mait) Anaphylaxis    Rapid heart rate.  . Penicillins Anaphylaxis    Has patient had a PCN reaction causing immediate rash, facial/tongue/throat swelling, SOB or lightheadedness with hypotension: Yes Has patient had a PCN reaction causing severe rash involving mucus membranes or skin necrosis: No Has patient had a PCN reaction that required hospitalization Yes Has patient had a PCN reaction occurring within the last 10 years: No If all of the above answers are "NO", then may proceed with Cephalosporin use.   [name] Doxycycline     History of Present Illness    Mr. Stallbaumer has a PMH of paroxysmal atrial fibrillation, mild CAD, essential hypertension, HLD, COPD, GERD, type 2 diabetes, CVA/TIA, and tobacco abuse.  He was seen by Dr. Rayann Heman virtually 04/16/2019 after a trip to the emergency room with atrial fibrillation.  Indicated that his atrial fibrillation had lasted for several hours.  He was very symptomatic and was noted to have tachypalpitations and chest discomfort.  He denied lower extremity edema, shortness  of breath, dizziness, presyncope/syncope.  He has had better control of his atrial fibrillation by following a strict diet.  He reported compliance with his Xarelto and metoprolol.  Dr. Rayann Heman recommended ablation.  He was last seen by Katina Dung, NP on 02/09/2020.  During that time he had complaints of increased dyspnea with exertion.  He remarked that he would notice increased palpitations/atrial fibrillation with increased exertion.  He noted a needlelike sensation in his left chest during the  episodes.  He denied associated radiation, nausea, vomiting, and diaphoresis.  The episodes were intermittent.  He was cutting back with his smoking.  His EKG at that time showed sinus rhythm with occasional premature ventricular complexes and a rate of 62.  He contacted the clinic and indicating that he was having left arm pain, requested a visit, and felt like he may be developing a blockage.  He presents the clinic today for follow-up evaluation states he has been having shooting pain that runs down the inside of his arm for the last several weeks.  He describes the pain as sharp and lasting for seconds before dispersing with rest.  The episodes are intermittent and not associated with exercise.  He states that he has had x-rays of his neck and been told that he has less space between his vertebrae and he should have.  He also saw his PCP who told him that he possibly has bursitis.  We discussed nerve pain and that this is related to his shoulder and neck impingement.  He is also tapering back his smoking and is smoking about 1 pack every 3 days.  I will give him the salty 6 diet sheet, smoking cessation information, increase his physical activity as tolerated, and have him follow-up in  6 months.  Today he denies chest pain, shortness of breath, lower extremity edema, fatigue, palpitations, melena, hematuria, hemoptysis, diaphoresis, weakness, presyncope, syncope, orthopnea, and PND.    Home Medications    Prior to Admission medications   Medication Sig Start Date End Date Taking? Authorizing Provider  acetaminophen (TYLENOL) 325 MG tablet Take 650 mg by mouth every 6 (six) hours as needed for pain or fever.     [provider]  ALPRAZolam Duanne Moron) 0.5 MG tablet Take one tablet by mouth three times a day and two tablets at night 11/26/19   Cloria Spring, MD  amLODipine (NORVASC) 10 MG tablet Take 1 tablet (10 mg total) by mouth daily. 02/02/20   Allred, Jeneen Rinks, MD  budesonide-formoterol  (SYMBICORT) 80-4.5 MCG/ACT inhaler Inhale 2 puffs into the lungs 2 (two) times daily as needed.     [provider]  calcium carbonate (TUMS - DOSED IN MG ELEMENTAL CALCIUM) 500 MG chewable tablet Chew 1 tablet by mouth daily as needed for indigestion or heartburn.    [provider]  cyclobenzaprine (FLEXERIL) 10 MG tablet Take 1 tablet (10 mg total) by mouth 3 (three) times daily. 10/07/15   Lily Kocher, PA-C  esomeprazole (NEXIUM) 40 MG capsule Take 1 capsule (40 mg total) by mouth daily. 04/01/20   [name], PA-C  HYDROcodone-acetaminophen (NORCO/VICODIN) 5-325 MG tablet Take one to two tablets every 4-6 hours as needed for pain 01/04/17   Marybelle Killings, MD  JANUVIA 100 MG tablet TAKE 1 Tablet BY MOUTH ONCE EVERY DAY 02/10/20   [name], PA-C  metoprolol succinate (TOPROL-XL) 50 MG 24 hr tablet Take 1 and 1/2 tabs by mouth  with or immediately following a meal. 02/02/20  Thompson Grayer, MD  nitroGLYCERIN (NITROSTAT) 0.4 MG SL tablet Place 1 tablet (0.4 mg total) under the tongue every 5 (five) minutes x 3 doses as needed for chest pain (if no relief after 3rd dose, proceed to the ED for an evaluation). 02/02/20   Allred, Jeneen Rinks, MD  PROVENTIL HFA 108 [phone] Base) MCG/ACT inhaler INHALE 2 PUFFS BY MOUTH EVERY 6 HOURS AS NEEDED FOR COUGHING, WHEEZING, OR SHORTNESS OF BREATH 01/01/20   [name], PA-C  simvastatin (ZOCOR) 20 MG tablet TAKE 1 TABLET BY MOUTH ONCE DAILY AT BEDTIME 10/06/19   [name], PA-C  XARELTO 20 MG TABS tablet Take 1 tablet (20 mg total) by mouth daily with supper. 02/11/20   Verta Ellen., NP    Family History    Family History  Problem Relation Age of Onset  . Lung cancer Mother   . Alcohol abuse Mother   . Heart attack Father 50  . Diabetes Father   . Alcohol abuse Father   . Hypertension Brother   . Hypertension Brother   . Anxiety disorder Sister   . Depression Sister   . Anxiety disorder Sister   . Heart attack  Brother 82  . Diabetes Brother   . Hypertension Brother   . Seizures Brother   . Dementia Paternal Uncle   . Dementia Cousin   . ADD / ADHD Daughter   . Colon cancer Neg Hx   . Drug abuse Neg Hx   . Bipolar disorder Neg Hx   . OCD Neg Hx   . Paranoid behavior Neg Hx   . Schizophrenia Neg Hx   . Sexual abuse Neg Hx   . Physical abuse Neg Hx    He indicated that his mother is deceased. He indicated that his father is deceased. He indicated that both of his sisters are alive. He indicated that five of his six brothers are alive. He indicated that the status of his daughter is unknown. He indicated that the status of his paternal uncle is unknown. He indicated that the status of his cousin is unknown. He indicated that the status of his neg hx is unknown.  Social History    Social History   Socioeconomic History  . Marital status: Married    Spouse name: Not on file  . Number of children: Not on file  . Years of education: Not on file  . Highest education level: Not on file  Occupational History  . Occupation: full time    Employer: UNEMPLOYED  Tobacco Use  . Smoking status: Current Every Day Smoker    Packs/day: 0.25    Years: 25.00    Pack years: 6.25    Types: Cigarettes    Start date: 07/17/1982  . Smokeless tobacco: Never Used  . Tobacco comment: "smoking lightly"  Vaping Use  . Vaping Use: Never used  Substance and Sexual Activity  . Alcohol use: No    Alcohol/week: 0.0 standard drinks  . Drug use: No  . Sexual activity: Yes    Birth control/protection: None  Other Topics Concern  . Not on file  Social History Narrative   Pt lives in [place] with wife.  5 children.  Unemployed due to panic attacks and back pain   Social Determinants of Health   Financial Resource Strain:   . Difficulty of Paying Living Expenses: Not on file  Food Insecurity:   . Worried About Charity fundraiser in the Last Year: Not on file  .  Ran Out of Food in the Last Year: Not on  file  Transportation Needs:   . Lack of Transportation (Medical): Not on file  . Lack of Transportation (Non-Medical): Not on file  Physical Activity:   . Days of Exercise per Week: Not on file  . Minutes of Exercise per Session: Not on file  Stress:   . Feeling of Stress : Not on file  Social Connections:   . Frequency of Communication with Friends and Family: Not on file  . Frequency of Social Gatherings with Friends and Family: Not on file  . Attends Religious Services: Not on file  . Active Member of Clubs or Organizations: Not on file  . Attends Archivist Meetings: Not on file  . Marital Status: Not on file  Intimate Partner Violence:   . Fear of Current or Ex-Partner: Not on file  . Emotionally Abused: Not on file  . Physically Abused: Not on file  . Sexually Abused: Not on file     Review of Systems    General:  No chills, fever, night sweats or weight changes.  Cardiovascular:  No chest pain, dyspnea on exertion, edema, orthopnea, palpitations, paroxysmal nocturnal dyspnea. Dermatological: No rash, lesions/masses Respiratory: No cough, dyspnea Urologic: No hematuria, dysuria Abdominal:   No nausea, vomiting, diarrhea, bright red blood per rectum, melena, or hematemesis Neurologic:  No visual changes, wkns, changes in mental status. All other systems reviewed and are otherwise negative except as noted above.  Physical Exam    VS:  BP 126/66   Pulse 60   Ht 5' 11.5" (1.816 m)   Wt 196 lb (88.9 kg)   SpO2 98%   BMI 26.96 kg/m  , BMI Body mass index is 26.96 kg/m. GEN: Well nourished, well developed, in no acute distress. HEENT: normal. Neck: Supple, no JVD, carotid bruits, or masses. Cardiac: RRR, no murmurs, rubs, or gallops. No clubbing, cyanosis, edema.  Radials/DP/PT 2+ and equal bilaterally.  Respiratory:  Respirations regular and unlabored, clear to auscultation bilaterally. GI: Soft, nontender, nondistended, BS + x 4. MS: no deformity or  atrophy. Skin: warm and dry, no rash. Neuro:  Strength and sensation are intact. Psych: Normal affect.  Accessory Clinical Findings    Recent Labs: 03/31/2020: ALT 16; BUN 16; Creatinine, Ser 1.01; Potassium 4.0; Sodium 136   Recent Lipid Panel    Component Value Date/Time   CHOL 126 03/31/2020 1006   TRIG 95 03/31/2020 1006   HDL 32 (L) 03/31/2020 1006   CHOLHDL 3.9 03/31/2020 1006   VLDL 19 03/31/2020 1006   LDLCALC 75 03/31/2020 1006    ECG personally reviewed by me today-sinus bradycardia rightward axis deviation 54 bpm- No acute changes  Cardiac catheterization 08/12/2015 1. Dist Cx lesion, 65% stenosed. 2. Mid LAD to Dist LAD lesion, 20% stenosed. 3. Prox RCA to Dist RCA lesion, 20% stenosed.    Diffuse nonobstructive atherosclerosis of all 3 coronary arteries.  After intracoronary nitroglycerin, a 50-70% distal circumflex stenosis was identified. It does not appear to be hemodynamically significant.  Normal left ventricular systolic function with ejection fraction of 60%. EDP is up at normal.  Diagnostic Dominance: Co-dominant  Intervention     RECOMMENDATIONS:   Aggressive risk factor modification  Smoking cessation   Echocardiogram 02/24/2020 IMPRESSIONS    1. Left ventricular ejection fraction, by estimation, is 65 to 70%. The  left ventricle has normal function. The left ventricle has no regional  wall motion abnormalities. Left ventricular diastolic parameters were  normal.  2. Right ventricular systolic function is normal. The right ventricular  size is normal. There is normal pulmonary artery systolic pressure.  3. The mitral valve is normal in structure. No evidence of mitral valve  regurgitation. No evidence of mitral stenosis.  4. The aortic valve is tricuspid. Aortic valve regurgitation is not  visualized. No aortic stenosis is present.   Comparison(s): Echocardiogram done 08/12/15 showed an EF of 60-65%.     Assessment &  Plan   1.  Left arm pain-has noted brief sharp episodes of arm pain over the last several weeks.  Appears to be related to cervical/shoulder impingement producing nerve pain. Discussed practicing proper posture Resting the area Follow-up with PCP/orthopedic if pain continues.  Coronary artery disease-no recent episodes of chest discomfort.   Cardiac catheterization 08/12/2015 showed diffuse nonobstructive CAD with 65% distal circumflex, 20% RCA, 20% LAD.  Medical management was recommended Continue amlodipine, metoprolol, simvastatin Heart healthy low-sodium diet-salty 6 given Increase physical activity as tolerated  Essential hypertension-BP today 126/66.  Well-controlled at home. Continue amlodipine, metoprolol, Heart healthy low-sodium diet-salty 6 given Increase physical activity as tolerated  Hyperlipidemia-03/31/2020: Cholesterol 126; HDL 32; LDL Cholesterol 75; Triglycerides 95; VLDL 19 Continue simvastatin Heart healthy low-sodium high-fiber diet Increase physical activity as tolerated  Paroxysmal atrial fibrillation-heart rate today 54 bpm.  Continues to notice atrial fibrillation with increased physical activity. Continue metoprolol, Xarelto Heart healthy low-sodium diet-salty 6 given Increase physical activity as tolerated Avoid triggers caffeine, chocolate, EtOH etc.  Dyspnea on exertion-increased with episodes of A. fib.  No increased work of breathing or activity intolerance today.  Echocardiogram 02/24/2020 showed normal EF. Continue to increase physical activity as tolerated  Tobacco abuse-is now smoking less, about 1 pack every 3 days. Smoking cessation information given  Disposition: Follow-up in 6 month.  Jossie Ng. Addalyne Vandehei NP-C    05/05/2020, 9:35 AM [address] Office [phone] Fax [phone]  Notice: This dictation was prepared with Dragon dictation along with smaller phrase technology. Any  transcriptional errors that result from this process are unintentional and may not be corrected upon review.

## 2020-05-05 ENCOUNTER — Other Ambulatory Visit: Payer: Self-pay

## 2020-05-05 ENCOUNTER — Ambulatory Visit (INDEPENDENT_AMBULATORY_CARE_PROVIDER_SITE_OTHER): Payer: Self-pay | Admitting: General Practice

## 2020-05-05 ENCOUNTER — Encounter: Payer: Self-pay | Admitting: General Practice

## 2020-05-05 VITALS — BP 126/66 | HR 60 | Ht 71.5 in | Wt 196.0 lb

## 2020-05-05 DIAGNOSIS — I480 Paroxysmal atrial fibrillation: Secondary | ICD-10-CM

## 2020-05-05 DIAGNOSIS — I10 Essential (primary) hypertension: Secondary | ICD-10-CM

## 2020-05-05 DIAGNOSIS — M79602 Pain in left arm: Secondary | ICD-10-CM

## 2020-05-05 DIAGNOSIS — E785 Hyperlipidemia, unspecified: Secondary | ICD-10-CM

## 2020-05-05 DIAGNOSIS — R0609 Other forms of dyspnea: Secondary | ICD-10-CM

## 2020-05-05 DIAGNOSIS — I2510 Atherosclerotic heart disease of native coronary artery without angina pectoris: Secondary | ICD-10-CM

## 2020-05-05 DIAGNOSIS — R0600 Dyspnea, unspecified: Secondary | ICD-10-CM

## 2020-05-05 DIAGNOSIS — Z720 Tobacco use: Secondary | ICD-10-CM

## 2020-05-05 NOTE — Patient Instructions (Signed)
Medication Instructions:  Your physician recommends that you continue on your current medications as directed. Please refer to the Current Medication list given to you today.  *If you need a refill on your cardiac medications before your next appointment, please call your pharmacy*   Lab Work: None today If you have labs (blood work) drawn today and your tests are completely normal, you will receive your results only by: [name] MyChart Message (if you have MyChart) OR . A paper copy in the mail If you have any lab test that is abnormal or we need to change your treatment, we will call you to review the results.   Testing/Procedures: None today   Follow-Up: At [facility], you and your health needs are our priority.  As part of our continuing mission to provide you with exceptional heart care, we have created designated Provider Care Teams.  These Care Teams include your primary Cardiologist (physician) and Advanced Practice Providers (APPs -  Physician Assistants and Nurse Practitioners) who all work together to provide you with the care you need, when you need it.  We recommend signing up for the patient portal called "MyChart".  Sign up information is provided on this After Visit Summary.  MyChart is used to connect with patients for Virtual Visits (Telemedicine).  Patients are able to view lab/test results, encounter notes, upcoming appointments, etc.  Non-urgent messages can be sent to your provider as well.   To learn more about what you can do with MyChart, go to [email or web address].    Your next appointment:   6 month(s)  The format for your next appointment:   In Person  Provider:   Rozann Lesches, MD   Other Instructions: read "[facility] [place]" brochure    You may use, ice, heat and rest for left shoulder/arm.Follow up with pcp if not better.         Coping with Quitting Smoking   Quitting smoking is a physical and mental challenge. You will face cravings,  withdrawal symptoms, and temptation. Before quitting, work with your health care provider to make a plan that can help you cope. Preparation can help you quit and keep you from giving in. How can I cope with cravings? Cravings usually last for 5-10 minutes. If you get through it, the craving will pass. Consider taking the following actions to help you cope with cravings:  Keep your mouth busy: ? Chew sugar-free gum. ? Suck on hard candies or a straw. ? Brush your teeth.  Keep your hands and body busy: ? Immediately change to a different activity when you feel a craving. ? Squeeze or play with a ball. ? Do an activity or a hobby, like making bead jewelry, practicing needlepoint, or working with wood. ? Mix up your normal routine. ? Take a short exercise break. Go for a quick walk or run up and down stairs. ? Spend time in public places where smoking is not allowed.  Focus on doing something kind or helpful for someone else.  Call a friend or family member to talk during a craving.  Join a support group.  Call a quit line, such as 1-800-QUIT-NOW.  Talk with your health care provider about medicines that might help you cope with cravings and make quitting easier for you. How can I deal with withdrawal symptoms? Your body may experience negative effects as it tries to get used to not having nicotine in the system. These effects are called withdrawal symptoms. They may include:  Feeling  hungrier than normal.  Trouble concentrating.  Irritability.  Trouble sleeping.  Feeling depressed.  Restlessness and agitation.  Craving a cigarette. To manage withdrawal symptoms:  Avoid places, people, and activities that trigger your cravings.  Remember why you want to quit.  Get plenty of sleep.  Avoid coffee and other caffeinated drinks. These may worsen some of your symptoms. How can I handle social situations? Social situations can be difficult when you are quitting smoking,  especially in the first few weeks. To manage this, you can:  Avoid parties, bars, and other social situations where people might be smoking.  Avoid alcohol.  Leave right away if you have the urge to smoke.  Explain to your family and friends that you are quitting smoking. Ask for understanding and support.  Plan activities with friends or family where smoking is not an option. What are some ways I can cope with stress? Wanting to smoke may cause stress, and stress can make you want to smoke. Find ways to manage your stress. Relaxation techniques can help. For example:  Breathe slowly and deeply, in through your nose and out through your mouth.  Listen to soothing, relaxing music.  Talk with a family member or friend about your stress.  Light a candle.  Soak in a bath or take a shower.  Think about a peaceful place. What are some ways I can prevent weight gain? Be aware that many people gain weight after they quit smoking. However, not everyone does. To keep from gaining weight, have a plan in place before you quit and stick to the plan after you quit. Your plan should include:  Having healthy snacks. When you have a craving, it may help to: ? Eat plain popcorn, crunchy carrots, celery, or other cut vegetables. ? Chew sugar-free gum.  Changing how you eat: ? Eat small portion sizes at meals. ? Eat 4-6 small meals throughout the day instead of 1-2 large meals a day. ? Be mindful when you eat. Do not watch television or do other things that might distract you as you eat.  Exercising regularly: ? Make time to exercise each day. If you do not have time for a long workout, do short bouts of exercise for 5-10 minutes several times a day. ? Do some form of strengthening exercise, like weight lifting, and some form of aerobic exercise, like running or swimming.  Drinking plenty of water or other low-calorie or no-calorie drinks. Drink 6-8 glasses of water daily, or as much as  instructed by your health care provider. Summary  Quitting smoking is a physical and mental challenge. You will face cravings, withdrawal symptoms, and temptation to smoke again. Preparation can help you as you go through these challenges.  You can cope with cravings by keeping your mouth busy (such as by chewing gum), keeping your body and hands busy, and making calls to family, friends, or a helpline for people who want to quit smoking.  You can cope with withdrawal symptoms by avoiding places where people smoke, avoiding drinks with caffeine, and getting plenty of rest.  Ask your health care provider about the different ways to prevent weight gain, avoid stress, and handle social situations. This information is not intended to replace advice given to you by your health care provider. Make sure you discuss any questions you have with your health care provider. Document Revised: 06/15/2017 Document Reviewed: 06/30/2016 Elsevier Patient Education  2020 [employer].

## 2020-05-06 ENCOUNTER — Telehealth: Payer: Self-pay | Admitting: Family Medicine

## 2020-05-06 MED ORDER — METOPROLOL SUCCINATE ER 50 MG PO TB24: ORAL_TABLET | ORAL | 3 refills | Status: DC

## 2020-05-06 NOTE — Telephone Encounter (Signed)
New message    Pharmacy needs new rx for this medication so they will fill it for 90 days   *STAT* If patient is at the pharmacy, call can be transferred to refill team.   1. Which medications need to be refilled? (please list name of each medication and dose if known) metoprolol succinate (TOPROL-XL) 50 MG 24 hr tablet  2. Which pharmacy/location (including street and city if local pharmacy) is medication to be sent to? [facility] [address]  3. Do they need a 30 day or 90 day supply? [place]

## 2020-05-06 NOTE — Telephone Encounter (Signed)
refilled [facility] [address]

## 2020-05-13 ENCOUNTER — Ambulatory Visit: Payer: No Typology Code available for payment source | Admitting: Family Medicine

## 2020-06-03 ENCOUNTER — Ambulatory Visit (INDEPENDENT_AMBULATORY_CARE_PROVIDER_SITE_OTHER): Payer: Self-pay | Admitting: Gastroenterology

## 2020-06-03 ENCOUNTER — Encounter: Payer: Self-pay | Admitting: Gastroenterology

## 2020-06-03 ENCOUNTER — Other Ambulatory Visit: Payer: Self-pay

## 2020-06-03 VITALS — BP 139/84 | HR 58 | Temp 96.9°F | Ht 72.0 in | Wt 194.8 lb

## 2020-06-03 DIAGNOSIS — K219 Gastro-esophageal reflux disease without esophagitis: Secondary | ICD-10-CM

## 2020-06-03 MED ORDER — ESOMEPRAZOLE MAGNESIUM 40 MG PO CPDR: 40.0000 mg | DELAYED_RELEASE_CAPSULE | Freq: Two times a day (BID) | ORAL | 5 refills | Status: DC

## 2020-06-03 NOTE — Progress Notes (Signed)
Cc'ed to pcp °

## 2020-06-03 NOTE — Progress Notes (Signed)
Referring Provider: [name], PA-C Primary Care Physician:  [name], PA-C Primary GI: Dr. Abbey Chatters  Chief Complaint  Patient presents with  . Gastroesophageal Reflux    "all the time"; stays hungry    HPI:   Richard [name] is a 54 y.o. male presenting today with a history of chronic GERD, abdominal pain, dysphagia, and constipation. 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   Doesn't get full. Eating several plates then has regurgitation. Avoiding all spicy foods. Twitches bilaterally in upper abdomen but no pain. Linzess 145 too strong, kept bowels liquid 90% of the time. BM once to twice per day, sometimes every other day. Nexium BID cut out all GERD symptoms. Only able to afford once per day. Hasn't used GoodRx card yet.   Past Medical History:  Diagnosis Date  . Arthritis    "legs, spine, shoulders" (08/12/2015)  . Asthma   . Colitis 1990  . COPD (chronic obstructive pulmonary disease) ([place])   . Depression   . Gastric ulcer 2003; 2012   2003: + esophagitis; negative H.pylori serology  2012: Dr. Oneida Alar, mild gastritis, Bravo PH probe placement, negative H.pylori  . GERD (gastroesophageal reflux disease)   . Hepatic steatosis   . History of hiatal hernia   . Hyperlipemia   . Hypertension   . Mild CAD    a. Cardiac cath 07/2015 showed 65% distal Cx, 20% mid-distal LAD, 20% prox-distal RCA, EF 60%, EDP 61mmHg.  [name] Overweight   . Panic attacks   . Paroxysmal atrial fibrillation (HCC)   . Stroke [facility])    mucsle in left side of face dosent work as its supposed to-only deficit.  [name] TIA (transient ischemic attack)    after cath 1/ 17  . Tobacco abuse    1/2 pack per day  . Type II diabetes mellitus ([place])     Past Surgical History:  Procedure Laterality Date  . BRAVO [place] STUDY  05/03/2011   EUM:PNTI gastritis/normal esophagus and duodenum  . CARDIAC CATHETERIZATION  1990s X 1; 2005; 08/12/2015  . CARDIAC CATHETERIZATION N/A  08/12/2015   Procedure: Left Heart Cath and Coronary Angiography;  Surgeon: Belva Crome, MD; LAD 20%, CFX 65%, RCA 20%, EF 60%   . COLONOSCOPY  1990  . COLONOSCOPY WITH PROPOFOL N/A 11/21/2016   Dr. Oneida Alar: non-thrombosed external hemorrhoids, one 6 mm polyp (polypoid lesion), internal hemorrhoids. TI Normal. 10 years screening  . ESOPHAGOGASTRODUODENOSCOPY  05/03/2011   RWE:RXVQ gastritis  . NECK MASS EXCISION Right    "done in dr's office; behind right ear/side of ncek"  . POLYPECTOMY  11/21/2016   Procedure: POLYPECTOMY;  Surgeon: [name], MD;  Location: [facility];  Service: Endoscopy;;  descending colon polyp  . SHOULDER ARTHROSCOPY W/ ROTATOR CUFF REPAIR Right 2006   acromioclavicular joint arthrosis    Current Outpatient Medications  Medication Sig Dispense Refill  . acetaminophen (TYLENOL) 325 MG tablet Take 650 mg by mouth every 6 (six) hours as needed for pain or fever.     . ALPRAZolam (XANAX) 0.5 MG tablet Take one tablet by mouth three times a day and two tablets at night 150 tablet 2  . amLODipine (NORVASC) 10 MG tablet Take 1 tablet (10 mg total) by mouth daily. 90 tablet 3  . budesonide-formoterol (SYMBICORT) 80-4.5 MCG/ACT inhaler Inhale 2 puffs into the lungs 2 (two) times daily as needed.     . calcium carbonate (TUMS - DOSED IN MG ELEMENTAL CALCIUM)  500 MG chewable tablet Chew 1 tablet by mouth daily as needed for indigestion or heartburn.    . cyclobenzaprine (FLEXERIL) 10 MG tablet Take 1 tablet (10 mg total) by mouth 3 (three) times daily. (Patient taking differently: Take 10 mg by mouth as needed. ) 21 tablet 0  . esomeprazole (NEXIUM) 40 MG capsule Take 1 capsule (40 mg total) by mouth daily. 30 capsule 3  . HYDROcodone-acetaminophen (NORCO/VICODIN) 5-325 MG tablet Take one to two tablets every 4-6 hours as needed for pain 40 tablet 0  . JANUVIA 100 MG tablet TAKE 1 Tablet BY MOUTH ONCE EVERY DAY 90 tablet 0  . metoprolol succinate (TOPROL-XL) 50 MG 24 hr  tablet Take 1 and 1/2 tabs by mouth  with or immediately following a meal. 135 tablet 3  . nitroGLYCERIN (NITROSTAT) 0.4 MG SL tablet Place 1 tablet (0.4 mg total) under the tongue every 5 (five) minutes x 3 doses as needed for chest pain (if no relief after 3rd dose, proceed to the ED for an evaluation). 25 tablet 3  . PROVENTIL HFA 108 (90 Base) MCG/ACT inhaler INHALE 2 PUFFS BY MOUTH EVERY 6 HOURS AS NEEDED FOR COUGHING, WHEEZING, OR SHORTNESS OF BREATH 20.1 g 1  . simvastatin (ZOCOR) 20 MG tablet TAKE 1 TABLET BY MOUTH ONCE DAILY AT BEDTIME 30 tablet 3  . XARELTO 20 MG TABS tablet Take 1 tablet (20 mg total) by mouth daily with supper. 90 tablet 3   No current facility-administered medications for this visit.    Allergies as of 06/03/2020 - Review Complete 06/03/2020  Allergen Reaction Noted  . Dexilant [dexlansoprazole] Anaphylaxis 01/20/2015  . Mushroom ext cmplx(shiitake-reishi-mait) Anaphylaxis 03/22/2011  . Penicillins Anaphylaxis   . Doxycycline  04/18/2016    Family History  Problem Relation Age of Onset  . Lung cancer Mother   . Alcohol abuse Mother   . Heart attack Father 9  . Diabetes Father   . Alcohol abuse Father   . Hypertension Brother   . Hypertension Brother   . Anxiety disorder Sister   . Depression Sister   . Anxiety disorder Sister   . Heart attack Brother 79  . Diabetes Brother   . Hypertension Brother   . Seizures Brother   . Dementia Paternal Uncle   . Dementia Cousin   . ADD / ADHD Daughter   . Colon cancer Neg Hx   . Drug abuse Neg Hx   . Bipolar disorder Neg Hx   . OCD Neg Hx   . Paranoid behavior Neg Hx   . Schizophrenia Neg Hx   . Sexual abuse Neg Hx   . Physical abuse Neg Hx     Social History   Socioeconomic History  . Marital status: Married    Spouse name: Not on file  . Number of children: Not on file  . Years of education: Not on file  . Highest education level: Not on file  Occupational History  . Occupation: full time     Employer: UNEMPLOYED  Tobacco Use  . Smoking status: Current Every Day Smoker    Packs/day: 0.25    Years: 25.00    Pack years: 6.25    Types: Cigarettes    Start date: 07/17/1982  . Smokeless tobacco: Never Used  . Tobacco comment: "smoking lightly"  Vaping Use  . Vaping Use: Never used  Substance and Sexual Activity  . Alcohol use: No    Alcohol/week: 0.0 standard drinks  . Drug use: No  .  Sexual activity: Yes    Birth control/protection: None  Other Topics Concern  . Not on file  Social History Narrative   Pt lives in [place] with wife.  5 children.  Unemployed due to panic attacks and back pain   Social Determinants of Health   Financial Resource Strain:   . Difficulty of Paying Living Expenses: Not on file  Food Insecurity:   . Worried About Charity fundraiser in the Last Year: Not on file  . Ran Out of Food in the Last Year: Not on file  Transportation Needs:   . Lack of Transportation (Medical): Not on file  . Lack of Transportation (Non-Medical): Not on file  Physical Activity:   . Days of Exercise per Week: Not on file  . Minutes of Exercise per Session: Not on file  Stress:   . Feeling of Stress : Not on file  Social Connections:   . Frequency of Communication with Friends and Family: Not on file  . Frequency of Social Gatherings with Friends and Family: Not on file  . Attends Religious Services: Not on file  . Active Member of Clubs or Organizations: Not on file  . Attends Archivist Meetings: Not on file  . Marital Status: Not on file    Review of Systems: Gen: Denies fever, chills, anorexia. Denies fatigue, weakness, weight loss.  CV: Denies chest pain, palpitations, syncope, peripheral edema, and claudication. Resp: Denies dyspnea at rest, cough, wheezing, coughing up blood, and pleurisy. GI: see HPI Derm: Denies rash, itching, dry skin Psych: Denies depression, anxiety, memory loss, confusion. No homicidal or suicidal ideation.    Heme: Denies bruising, bleeding, and enlarged lymph nodes.  Physical Exam: BP 139/84   Pulse (!) 58   Temp (!) 96.9 F (36.1 C) (Temporal)   Ht 6' (1.829 m)   Wt 194 lb 12.8 oz (88.4 kg)   BMI 26.42 kg/m  General:   Alert and oriented. No distress noted. Pleasant and cooperative.  Head:  Normocephalic and atraumatic. Eyes:  Conjuctiva clear without scleral icterus. Mouth:  Mask in place Abdomen:  +BS, soft, non-tender and non-distended. No rebound or guarding. No HSM or masses noted. Msk:  Symmetrical without gross deformities. Normal posture. Extremities:  Without edema. Neurologic:  Alert and  oriented x4 Psych:  Alert and cooperative. Normal mood and affect.  ASSESSMENT: Richard [name] is a 54 y.o. male presenting today with history of chronic GERD, historically trying multiple PPIs but Nexium BID working best. Unfortunately, finances limit taking this BID. I have again provided him with a GoodRx savings card, which will knock down price considerably.  Constipation resolved.    PLAN:  Nexium 40 mg BID  6-8 month return  Continue dietary modifications  Annitta Needs, PhD, [facility]

## 2020-06-03 NOTE — Patient Instructions (Signed)
I have sent in Nexium to [facility]. Please use the Goodrx card to see if you can get a discount.  We will see you back in 6-8 months   I enjoyed seeing you again today! As you know, I value our relationship and want to provide genuine, compassionate, and quality care. I welcome your feedback. If you receive a survey regarding your visit,  I greatly appreciate you taking time to fill this out. See you next time!  Annitta Needs, PhD, ANP-BC [facility]

## 2020-06-08 ENCOUNTER — Telehealth: Payer: Self-pay | Admitting: Orthopaedic Surgery

## 2020-06-08 NOTE — Telephone Encounter (Signed)
Patient is requesting to see Dr Luna Glasgow for his current shoulder problem, most recently seen by Dr Lorin Mercy; therefore, second opinion evaluation. Notes/images are in system. Patient states has had no other treatment anywhere else. Please review and advise.

## 2020-06-14 ENCOUNTER — Encounter (HOSPITAL_COMMUNITY): Payer: Self-pay | Admitting: Psychiatry

## 2020-06-14 ENCOUNTER — Other Ambulatory Visit: Payer: Self-pay

## 2020-06-14 ENCOUNTER — Telehealth (INDEPENDENT_AMBULATORY_CARE_PROVIDER_SITE_OTHER): Payer: Self-pay | Admitting: Psychiatry

## 2020-06-14 DIAGNOSIS — F4001 Agoraphobia with panic disorder: Secondary | ICD-10-CM

## 2020-06-14 MED ORDER — ALPRAZOLAM 0.5 MG PO TABS
ORAL_TABLET | ORAL | 2 refills | Status: DC
Start: 2020-06-14 — End: 2020-09-13

## 2020-06-14 NOTE — Progress Notes (Signed)
Virtual Visit via Telephone Note  I connected with Richard [name] on 06/14/20 at  1:20 PM EST by telephone and verified that I am speaking with the correct person using two identifiers.  Location: Patient: home Provider: office   I discussed the limitations, risks, security and privacy concerns of performing an evaluation and management service by telephone and the availability of in person appointments. I also discussed with the patient that there may be a patient responsible charge related to this service. The patient expressed understanding and agreed to proceed.    I discussed the assessment and treatment plan with the patient. The patient was provided an opportunity to ask questions and all were answered. The patient agreed with the plan and demonstrated an understanding of the instructions.   The patient was advised to call back or seek an in-person evaluation if the symptoms worsen or if the condition fails to improve as anticipated.  I provided 15 minutes of non-face-to-face time during this encounter.   [name], MD  [facility] MD/PA/NP OP Progress Note  06/14/2020 1:40 PM Richard [name]  MRN:  [id number]  Chief Complaint:  Chief Complaint    Anxiety; Follow-up     HPI: Thispatient is a 54 year old Caucasian male who came for his followup appointment. He is currently living with his wife in [place]. He is unemployed and applying for disability.  The patient states that he has been employed for 5 years. 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 He got injured on the job and both shoulders were torn. He has not been able to work ever since and he feels very badly about this. 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  Since he's not been able to work the patient has been having increasingly depressed and anxious. He feels like his body gives out when he is trying to exert himself even in a minor  way.  The patient returns for follow-up after about 6 months.  He states he is generally been doing okay.  He does not always take all the Xanax is every day.  He often gets very anxious when he goes into stores with his wife.  I suggested that he could definitely take it on the days that he has to go out of the house.  He denies being depressed or having thoughts of self-harm or suicidal ideation. Visit Diagnosis:    ICD-10-CM   1. 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 Arthritis    "legs, spine, shoulders" (08/12/2015)  . Asthma   . Colitis 1990  . COPD (chronic obstructive pulmonary disease) ([place])   . Depression   . Gastric ulcer 2003; 2012   2003: + esophagitis; negative H.pylori serology  2012: Dr. Oneida Alar, mild gastritis, Bravo PH probe placement, negative H.pylori  . GERD (gastroesophageal reflux disease)   . Hepatic steatosis   . History of hiatal hernia   . Hyperlipemia   . Hypertension   . Mild CAD    a. Cardiac cath 07/2015 showed 65% distal Cx, 20% mid-distal LAD, 20% prox-distal RCA, EF 60%, EDP 51mmHg.  [name] Overweight   . Panic attacks   . Paroxysmal atrial fibrillation (HCC)   . Stroke [facility])    mucsle in left side of face dosent work  as its supposed to-only deficit.  [name] TIA (transient ischemic attack)    after cath 1/ 17  . Tobacco abuse    1/2 pack per day  . Type II diabetes mellitus ([place])     Past Surgical History:  Procedure Laterality Date  . BRAVO [place] STUDY  05/03/2011   BWG:[id number] gastritis/normal esophagus and duodenum  . CARDIAC CATHETERIZATION  1990s X 1; 2005; 08/12/2015  . CARDIAC CATHETERIZATION N/A 08/12/2015   Procedure: Left Heart Cath and Coronary Angiography;  Surgeon: Belva Crome, MD; LAD 20%, CFX 65%, RCA 20%, EF 60%   . COLONOSCOPY  1990  . COLONOSCOPY WITH PROPOFOL N/A 11/21/2016   Dr. Oneida Alar: non-thrombosed external  hemorrhoids, one 6 mm polyp (polypoid lesion), internal hemorrhoids. TI Normal. 10 years screening  . ESOPHAGOGASTRODUODENOSCOPY  05/03/2011   DJT:TSVX gastritis  . NECK MASS EXCISION Right    "done in dr's office; behind right ear/side of ncek"  . POLYPECTOMY  11/21/2016   Procedure: POLYPECTOMY;  Surgeon: [name], MD;  Location: [facility];  Service: Endoscopy;;  descending colon polyp  . SHOULDER ARTHROSCOPY W/ ROTATOR CUFF REPAIR Right 2006   acromioclavicular joint arthrosis    Family Psychiatric History: see below  Family History:  Family History  Problem Relation Age of Onset  . Lung cancer Mother   . Alcohol abuse Mother   . Heart attack Father 95  . Diabetes Father   . Alcohol abuse Father   . Hypertension Brother   . Hypertension Brother   . Anxiety disorder Sister   . Depression Sister   . Anxiety disorder Sister   . Heart attack Brother 25  . Diabetes Brother   . Hypertension Brother   . Seizures Brother   . Dementia Paternal Uncle   . Dementia Cousin   . ADD / ADHD Daughter   . Colon cancer Neg Hx   . Drug abuse Neg Hx   . Bipolar disorder Neg Hx   . OCD Neg Hx   . Paranoid behavior Neg Hx   . Schizophrenia Neg Hx   . Sexual abuse Neg Hx   . Physical abuse Neg Hx     Social History:  Social History   Socioeconomic History  . Marital status: Married    Spouse name: Not on file  . Number of children: Not on file  . Years of education: Not on file  . Highest education level: Not on file  Occupational History  . Occupation: full time    Employer: UNEMPLOYED  Tobacco Use  . Smoking status: Current Every Day Smoker    Packs/day: 0.25    Years: 25.00    Pack years: 6.25    Types: Cigarettes    Start date: 07/17/1982  . Smokeless tobacco: Never Used  . Tobacco comment: "smoking lightly"  Vaping Use  . Vaping Use: Never used  Substance and Sexual Activity  . Alcohol use: No    Alcohol/week: 0.0 standard drinks  . Drug use: No  . Sexual  activity: Yes    Birth control/protection: None  Other Topics Concern  . Not on file  Social History Narrative   Pt lives in [place] with wife.  5 children.  Unemployed due to panic attacks and back pain   Social Determinants of Health   Financial Resource Strain:   . Difficulty of Paying Living Expenses: Not on file  Food Insecurity:   . Worried About Charity fundraiser in the Last Year: Not  on file  . Ran Out of Food in the Last Year: Not on file  Transportation Needs:   . Lack of Transportation (Medical): Not on file  . Lack of Transportation (Non-Medical): Not on file  Physical Activity:   . Days of Exercise per Week: Not on file  . Minutes of Exercise per Session: Not on file  Stress:   . Feeling of Stress : Not on file  Social Connections:   . Frequency of Communication with Friends and Family: Not on file  . Frequency of Social Gatherings with Friends and Family: Not on file  . Attends Religious Services: Not on file  . Active Member of Clubs or Organizations: Not on file  . Attends Archivist Meetings: Not on file  . Marital Status: Not on file    Allergies:  Allergies  Allergen Reactions  . Dexilant [Dexlansoprazole] Anaphylaxis  . Mushroom Ext Cmplx(Shiitake-Reishi-Mait) Anaphylaxis    Rapid heart rate.  . Penicillins Anaphylaxis    Has patient had a PCN reaction causing immediate rash, facial/tongue/throat swelling, SOB or lightheadedness with hypotension: Yes Has patient had a PCN reaction causing severe rash involving mucus membranes or skin necrosis: No Has patient had a PCN reaction that required hospitalization Yes Has patient had a PCN reaction occurring within the last 10 years: No If all of the above answers are "NO", then may proceed with Cephalosporin use.   [name] Doxycycline     Metabolic Disorder Labs: Lab Results  Component Value Date   HGBA1C 6.5 (H) 03/31/2020   MPG 139.85 03/31/2020   MPG 148.46 11/25/2019   No results  found for: PROLACTIN Lab Results  Component Value Date   CHOL 126 03/31/2020   TRIG 95 03/31/2020   HDL 32 (L) 03/31/2020   CHOLHDL 3.9 03/31/2020   VLDL 19 03/31/2020   LDLCALC 75 03/31/2020   LDLCALC 75 11/25/2019   Lab Results  Component Value Date   TSH 0.958 04/21/2011   TSH 1.402 05/27/2009    Therapeutic Level Labs: No results found for: LITHIUM No results found for: VALPROATE No components found for:  CBMZ  Current Medications: Current Outpatient Medications  Medication Sig Dispense Refill  . acetaminophen (TYLENOL) 325 MG tablet Take 650 mg by mouth every 6 (six) hours as needed for pain or fever.     . ALPRAZolam (XANAX) 0.5 MG tablet Take one tablet by mouth three times a day and two tablets at night 150 tablet 2  . amLODipine (NORVASC) 10 MG tablet Take 1 tablet (10 mg total) by mouth daily. 90 tablet 3  . budesonide-formoterol (SYMBICORT) 80-4.5 MCG/ACT inhaler Inhale 2 puffs into the lungs 2 (two) times daily as needed.     . calcium carbonate (TUMS - DOSED IN MG ELEMENTAL CALCIUM) 500 MG chewable tablet Chew 1 tablet by mouth daily as needed for indigestion or heartburn.    . cyclobenzaprine (FLEXERIL) 10 MG tablet Take 1 tablet (10 mg total) by mouth 3 (three) times daily. (Patient taking differently: Take 10 mg by mouth as needed. ) 21 tablet 0  . esomeprazole (NEXIUM) 40 MG capsule Take 1 capsule (40 mg total) by mouth 2 (two) times daily before a meal. 60 capsule 5  . HYDROcodone-acetaminophen (NORCO/VICODIN) 5-325 MG tablet Take one to two tablets every 4-6 hours as needed for pain 40 tablet 0  . JANUVIA 100 MG tablet TAKE 1 Tablet BY MOUTH ONCE EVERY DAY 90 tablet 0  . metoprolol succinate (TOPROL-XL) 50 MG 24 hr  tablet Take 1 and 1/2 tabs by mouth  with or immediately following a meal. 135 tablet 3  . nitroGLYCERIN (NITROSTAT) 0.4 MG SL tablet Place 1 tablet (0.4 mg total) under the tongue every 5 (five) minutes x 3 doses as needed for chest pain (if no relief  after 3rd dose, proceed to the ED for an evaluation). 25 tablet 3  . PROVENTIL HFA 108 (90 Base) MCG/ACT inhaler INHALE 2 PUFFS BY MOUTH EVERY 6 HOURS AS NEEDED FOR COUGHING, WHEEZING, OR SHORTNESS OF BREATH 20.1 g 1  . simvastatin (ZOCOR) 20 MG tablet TAKE 1 TABLET BY MOUTH ONCE DAILY AT BEDTIME 30 tablet 3  . XARELTO 20 MG TABS tablet Take 1 tablet (20 mg total) by mouth daily with supper. 90 tablet 3   No current facility-administered medications for this visit.     Musculoskeletal: Strength & Muscle Tone: within normal limits Gait & Station: normal Patient leans: N/A  Psychiatric Specialty Exam: Review of Systems  Musculoskeletal: Positive for arthralgias and neck pain.  Neurological: Positive for weakness.  Psychiatric/Behavioral: The patient is nervous/anxious.   All other systems reviewed and are negative.   There were no vitals taken for this visit.There is no height or weight on file to calculate BMI.  General Appearance: NA  Eye Contact:  NA  Speech:  Clear and Coherent  Volume:  Normal  Mood:  anxious  Affect:  NA  Thought Process:  Goal Directed  Orientation:  Full (Time, Place, and Person)  Thought Content: Rumination   Suicidal Thoughts:  No  Homicidal Thoughts:  No  Memory:  Immediate;   Good Recent;   Good Remote;   Fair  Judgement:  Fair  Insight:  Fair  Psychomotor Activity:  Decreased  Concentration:  Concentration: Good and Attention Span: Good  Recall:  Good  Fund of Knowledge: Good  Language: Good  Akathisia:  No  Handed:  Right  AIMS (if indicated): not done  Assets:  Communication Skills Desire for Improvement Resilience Social Support Talents/Skills  ADL's:  Intact  Cognition: WNL  Sleep:  Fair   Screenings: PHQ2-9     Patient Outreach Telephone from 08/30/2015 in [facility] Patient Outreach Telephone from 08/26/2015 in [facility]  PHQ-2 Total Score 2 4  PHQ-9 Total Score 8 8       Assessment and Plan:  This patient is a 54 year old male with a history of anxiety and panic attacks.  For the most part he is doing well.  He will continue Xanax 0.5 mg 3 times daily and 1 mg at bedtime for anxiety and sleep.  He will return to see me in 3 months   [name], MD 06/14/2020, 1:40 PM

## 2020-06-16 NOTE — Telephone Encounter (Signed)
I can see him if he desires.

## 2020-06-18 NOTE — Telephone Encounter (Signed)
Called patient to offer appointment, per Dr Brooke Bonito response. Left message to return call.

## 2020-06-18 NOTE — Telephone Encounter (Signed)
Appointment scheduled patient aware  

## 2020-06-25 ENCOUNTER — Telehealth: Payer: Self-pay | Admitting: Internal Medicine

## 2020-06-25 NOTE — Telephone Encounter (Signed)
Pt's wife called to see if we have anymore Nexium coupons. Please call [phone]

## 2020-06-28 NOTE — Telephone Encounter (Signed)
Phoned and left a message for the pt that nexium coupons were left for him at the front desk.

## 2020-06-29 ENCOUNTER — Other Ambulatory Visit: Payer: Self-pay

## 2020-06-29 ENCOUNTER — Ambulatory Visit (INDEPENDENT_AMBULATORY_CARE_PROVIDER_SITE_OTHER): Payer: Self-pay | Admitting: Orthopaedic Surgery

## 2020-06-29 ENCOUNTER — Encounter: Payer: Self-pay | Admitting: Orthopaedic Surgery

## 2020-06-29 VITALS — BP 135/80 | HR 61 | Ht 71.0 in | Wt 194.0 lb

## 2020-06-29 DIAGNOSIS — F17210 Nicotine dependence, cigarettes, uncomplicated: Secondary | ICD-10-CM

## 2020-06-29 DIAGNOSIS — Z9229 Personal history of other drug therapy: Secondary | ICD-10-CM

## 2020-06-29 DIAGNOSIS — G8929 Other chronic pain: Secondary | ICD-10-CM

## 2020-06-29 DIAGNOSIS — M25512 Pain in left shoulder: Secondary | ICD-10-CM

## 2020-06-29 NOTE — Progress Notes (Signed)
Subjective:    Patient ID: Richard [name], male    DOB: 1966/03/11, 54 y.o.   MRN: [id number]  HPI He has chronic pain of the left shoulder.  He has seen Dr. Lorin Mercy for this in September.  I have reviewed the notes and X-rays.  He has persistent pain and decreased motion.  He cannot take NSAIDs as he is on anti-coagulation for atrial fib.  He cannot take injection to shoulder secondary to severe reaction in the past.  He has pain with motion.  He has pain sleeping on the shoulder.  I will get MRI of the shoulder.  I have independently reviewed and interpreted x-rays of this patient done at another site by another physician or qualified health professional.     Review of Systems  Constitutional: Positive for activity change.  Respiratory: Positive for shortness of breath.   Cardiovascular: Positive for chest pain and palpitations.  Musculoskeletal: Positive for arthralgias, joint swelling and myalgias.  All other systems reviewed and are negative.  For Review of Systems, all other systems reviewed and are negative.  The following is a summary of the past history medically, past history surgically, known current medicines, social history and family history.  This information is gathered electronically by the computer from prior information and documentation.  I review this each visit and have found including this information at this point in the chart is beneficial and informative.   Past Medical History:  Diagnosis Date  . Arthritis    "legs, spine, shoulders" (08/12/2015)  . Asthma   . Colitis 1990  . COPD (chronic obstructive pulmonary disease) ([place])   . Depression   . Gastric ulcer 2003; 2012   2003: + esophagitis; negative H.pylori serology  2012: Dr. Oneida Alar, mild gastritis, Bravo PH probe placement, negative H.pylori  . GERD (gastroesophageal reflux disease)   . Hepatic steatosis   . History of hiatal hernia   . Hyperlipemia   . Hypertension   . Mild CAD    a. Cardiac  cath 07/2015 showed 65% distal Cx, 20% mid-distal LAD, 20% prox-distal RCA, EF 60%, EDP 30mmHg.  [name] Overweight   . Panic attacks   . Paroxysmal atrial fibrillation (HCC)   . Stroke [facility])    mucsle in left side of face dosent work as its supposed to-only deficit.  [name] TIA (transient ischemic attack)    after cath 1/ 17  . Tobacco abuse    1/2 pack per day  . Type II diabetes mellitus ([place])     Past Surgical History:  Procedure Laterality Date  . BRAVO [place] STUDY  05/03/2011   WLN:LGXQ gastritis/normal esophagus and duodenum  . CARDIAC CATHETERIZATION  1990s X 1; 2005; 08/12/2015  . CARDIAC CATHETERIZATION N/A 08/12/2015   Procedure: Left Heart Cath and Coronary Angiography;  Surgeon: Belva Crome, MD; LAD 20%, CFX 65%, RCA 20%, EF 60%   . COLONOSCOPY  1990  . COLONOSCOPY WITH PROPOFOL N/A 11/21/2016   Dr. Oneida Alar: non-thrombosed external hemorrhoids, one 6 mm polyp (polypoid lesion), internal hemorrhoids. TI Normal. 10 years screening  . ESOPHAGOGASTRODUODENOSCOPY  05/03/2011   JJH:ERDE gastritis  . NECK MASS EXCISION Right    "done in dr's office; behind right ear/side of ncek"  . POLYPECTOMY  11/21/2016   Procedure: POLYPECTOMY;  Surgeon: [name], MD;  Location: [facility] SU[facility];  Service: Endoscopy;;  descending colon polyp  . SHOULDER ARTHROSCOPY W/ ROTATOR CUFF REPAIR Right 2006   acromioclavicular joint arthrosis    Current  Outpatient Medications on File Prior to Visit  Medication Sig Dispense Refill  . acetaminophen (TYLENOL) 325 MG tablet Take 650 mg by mouth every 6 (six) hours as needed for pain or fever.    . ALPRAZolam (XANAX) 0.5 MG tablet Take one tablet by mouth three times a day and two tablets at night 150 tablet 2  . amLODipine (NORVASC) 10 MG tablet Take 1 tablet (10 mg total) by mouth daily. 90 tablet 3  . budesonide-formoterol (SYMBICORT) 80-4.5 MCG/ACT inhaler Inhale 2 puffs into the lungs 2 (two) times daily as needed.     . calcium carbonate (TUMS - DOSED IN MG  ELEMENTAL CALCIUM) 500 MG chewable tablet Chew 1 tablet by mouth daily as needed for indigestion or heartburn.    . cyclobenzaprine (FLEXERIL) 10 MG tablet Take 1 tablet (10 mg total) by mouth 3 (three) times daily. (Patient taking differently: Take 10 mg by mouth as needed.) 21 tablet 0  . esomeprazole (NEXIUM) 40 MG capsule Take 1 capsule (40 mg total) by mouth 2 (two) times daily before a meal. 60 capsule 5  . HYDROcodone-acetaminophen (NORCO/VICODIN) 5-325 MG tablet Take one to two tablets every 4-6 hours as needed for pain 40 tablet 0  . JANUVIA 100 MG tablet TAKE 1 Tablet BY MOUTH ONCE EVERY DAY 90 tablet 0  . metoprolol succinate (TOPROL-XL) 50 MG 24 hr tablet Take 1 and 1/2 tabs by mouth  with or immediately following a meal. 135 tablet 3  . nitroGLYCERIN (NITROSTAT) 0.4 MG SL tablet Place 1 tablet (0.4 mg total) under the tongue every 5 (five) minutes x 3 doses as needed for chest pain (if no relief after 3rd dose, proceed to the ED for an evaluation). 25 tablet 3  . PROVENTIL HFA 108 (90 Base) MCG/ACT inhaler INHALE 2 PUFFS BY MOUTH EVERY 6 HOURS AS NEEDED FOR COUGHING, WHEEZING, OR SHORTNESS OF BREATH 20.1 g 1  . simvastatin (ZOCOR) 20 MG tablet TAKE 1 TABLET BY MOUTH ONCE DAILY AT BEDTIME 30 tablet 3  . XARELTO 20 MG TABS tablet Take 1 tablet (20 mg total) by mouth daily with supper. 90 tablet 3   No current facility-administered medications on file prior to visit.    Social History   Socioeconomic History  . Marital status: Married    Spouse name: Not on file  . Number of children: Not on file  . Years of education: Not on file  . Highest education level: Not on file  Occupational History  . Occupation: full time    Employer: UNEMPLOYED  Tobacco Use  . Smoking status: Current Every Day Smoker    Packs/day: 0.25    Years: 25.00    Pack years: 6.25    Types: Cigarettes    Start date: 07/17/1982  . Smokeless tobacco: Never Used  . Tobacco comment: "smoking lightly"  Vaping  Use  . Vaping Use: Never used  Substance and Sexual Activity  . Alcohol use: No    Alcohol/week: 0.0 standard drinks  . Drug use: No  . Sexual activity: Yes    Birth control/protection: None  Other Topics Concern  . Not on file  Social History Narrative   Pt lives in [place] with wife.  5 children.  Unemployed due to panic attacks and back pain   Social Determinants of Health   Financial Resource Strain: Not on file  Food Insecurity: Not on file  Transportation Needs: Not on file  Physical Activity: Not on file  Stress: Not  on file  Social Connections: Not on file  Intimate Partner Violence: Not on file    Family History  Problem Relation Age of Onset  . Lung cancer Mother   . Alcohol abuse Mother   . Heart attack Father 86  . Diabetes Father   . Alcohol abuse Father   . Hypertension Brother   . Hypertension Brother   . Anxiety disorder Sister   . Depression Sister   . Anxiety disorder Sister   . Heart attack Brother 28  . Diabetes Brother   . Hypertension Brother   . Seizures Brother   . Dementia Paternal Uncle   . Dementia Cousin   . ADD / ADHD Daughter   . Colon cancer Neg Hx   . Drug abuse Neg Hx   . Bipolar disorder Neg Hx   . OCD Neg Hx   . Paranoid behavior Neg Hx   . Schizophrenia Neg Hx   . Sexual abuse Neg Hx   . Physical abuse Neg Hx     BP 135/80   Pulse 61   Ht 5\' 11"  (1.803 m)   Wt 194 lb (88 kg)   BMI 27.06 kg/m   Body mass index is 27.06 kg/m.      Objective:   Physical Exam Vitals and nursing note reviewed. Exam conducted with a chaperone present.  Constitutional:      Appearance: He is well-developed and well-nourished.  HENT:     Head: Normocephalic and atraumatic.  Eyes:     Extraocular Movements: EOM normal.     Conjunctiva/sclera: Conjunctivae normal.     Pupils: Pupils are equal, round, and reactive to light.  Cardiovascular:     Rate and Rhythm: Normal rate and regular rhythm.     Pulses: Intact distal pulses.   Pulmonary:     Effort: Pulmonary effort is normal.  Abdominal:     Palpations: Abdomen is soft.  Musculoskeletal:       Arms:     Cervical back: Normal range of motion and neck supple.  Skin:    General: Skin is warm and dry.  Neurological:     Mental Status: He is alert and oriented to person, place, and time.     Cranial Nerves: No cranial nerve deficit.     Motor: No abnormal muscle tone.     Coordination: Coordination normal.     Deep Tendon Reflexes: Reflexes are normal and symmetric. Reflexes normal.  Psychiatric:        Mood and Affect: Mood and affect normal.        Behavior: Behavior normal.        Thought Content: Thought content normal.        Judgment: Judgment normal.           Assessment & Plan:   Encounter Diagnoses  Name Primary?  . Chronic left shoulder pain Yes  . Nicotine dependence, cigarettes, uncomplicated   . HX: anticoagulation    For MRI of the left shoulder.  Return in three weeks.  Call if any problem.  Precautions discussed.   Electronically Signed Sanjuana Kava, MD 12/14/202110:07 AM

## 2020-06-29 NOTE — Patient Instructions (Signed)
[facility] Scheduling.  [phone].  Please call to schedule for Left shoulder MRI.

## 2020-07-03 ENCOUNTER — Other Ambulatory Visit: Payer: Self-pay

## 2020-07-03 ENCOUNTER — Ambulatory Visit
Admission: RE | Admit: 2020-07-03 | Discharge: 2020-07-03 | Disposition: A | Discharge status: Home / Self Care | Payer: Self-pay | Source: Ambulatory Visit | Attending: Emergency Medicine | Admitting: Emergency Medicine

## 2020-07-03 VITALS — BP 159/84 | HR 65 | Temp 98.1°F | Resp 18 | Ht 71.0 in | Wt 193.0 lb

## 2020-07-03 DIAGNOSIS — J069 Acute upper respiratory infection, unspecified: Secondary | ICD-10-CM

## 2020-07-03 DIAGNOSIS — Z1152 Encounter for screening for COVID-19: Secondary | ICD-10-CM

## 2020-07-03 MED ORDER — FLUTICASONE PROPIONATE 50 MCG/ACT NA SUSP: 1.0000 | Freq: Every day | NASAL | 0 refills | Status: DC

## 2020-07-03 MED ORDER — PREDNISONE 10 MG PO TABS: 20.0000 mg | ORAL_TABLET | Freq: Every day | ORAL | 0 refills | Status: DC

## 2020-07-03 MED ORDER — CETIRIZINE HCL 10 MG PO TABS: 10.0000 mg | ORAL_TABLET | Freq: Every day | ORAL | 0 refills | Status: DC

## 2020-07-03 MED ORDER — BENZONATATE 100 MG PO CAPS: 100.0000 mg | ORAL_CAPSULE | Freq: Three times a day (TID) | ORAL | 0 refills | Status: DC | PRN

## 2020-07-03 NOTE — ED Provider Notes (Signed)
[place]   [id number] 07/03/20 Arrival Time: 2947   CC: COVID symptoms  SUBJECTIVE: History from: patient.  Richard [name] is a 54 y.o. male who presented to the urgent care for complaint of fever, cough, congestion and headache for the past 2 days.  Denies sick exposure to COVID, flu or strep.  Denies recent travel.  Has tried OTC medication without relief.  Denies of any aggravating factors.  Denies previous symptoms in the past.   Denies fatigue, sinus pain, rhinorrhea, sore throat, SOB, wheezing, chest pain, nausea, changes in bowel or bladder habits.     ROS: As per HPI.  All other pertinent ROS negative.     Past Medical History:  Diagnosis Date  . Arthritis    "legs, spine, shoulders" (08/12/2015)  . Asthma   . Colitis 1990  . COPD (chronic obstructive pulmonary disease) ([place])   . Depression   . Gastric ulcer 2003; 2012   2003: + esophagitis; negative H.pylori serology  2012: Dr. Oneida Alar, mild gastritis, Bravo PH probe placement, negative H.pylori  . GERD (gastroesophageal reflux disease)   . Hepatic steatosis   . History of hiatal hernia   . Hyperlipemia   . Hypertension   . Mild CAD    a. Cardiac cath 07/2015 showed 65% distal Cx, 20% mid-distal LAD, 20% prox-distal RCA, EF 60%, EDP 44mmHg.  [name] Overweight   . Panic attacks   . Paroxysmal atrial fibrillation (HCC)   . Stroke [facility])    mucsle in left side of face dosent work as its supposed to-only deficit.  [name] TIA (transient ischemic attack)    after cath 1/ 17  . Tobacco abuse    1/2 pack per day  . Type II diabetes mellitus ([place])    Past Surgical History:  Procedure Laterality Date  . BRAVO [place] STUDY  05/03/2011   MLY:YTKP gastritis/normal esophagus and duodenum  . CARDIAC CATHETERIZATION  1990s X 1; 2005; 08/12/2015  . CARDIAC CATHETERIZATION N/A 08/12/2015   Procedure: Left Heart Cath and Coronary Angiography;  Surgeon: Belva Crome, MD; LAD 20%, CFX 65%, RCA 20%, EF 60%   . COLONOSCOPY  1990  .  COLONOSCOPY WITH PROPOFOL N/A 11/21/2016   Dr. Oneida Alar: non-thrombosed external hemorrhoids, one 6 mm polyp (polypoid lesion), internal hemorrhoids. TI Normal. 10 years screening  . ESOPHAGOGASTRODUODENOSCOPY  05/03/2011   TWS:FKCL gastritis  . NECK MASS EXCISION Right    "done in dr's office; behind right ear/side of ncek"  . POLYPECTOMY  11/21/2016   Procedure: POLYPECTOMY;  Surgeon: [name], MD;  Location: [facility];  Service: Endoscopy;;  descending colon polyp  . SHOULDER ARTHROSCOPY W/ ROTATOR CUFF REPAIR Right 2006   acromioclavicular joint arthrosis   Allergies  Allergen Reactions  . Dexilant [Dexlansoprazole] Anaphylaxis  . Mushroom Ext Cmplx(Shiitake-Reishi-Mait) Anaphylaxis    Rapid heart rate.  . Penicillins Anaphylaxis    Has patient had a PCN reaction causing immediate rash, facial/tongue/throat swelling, SOB or lightheadedness with hypotension: Yes Has patient had a PCN reaction causing severe rash involving mucus membranes or skin necrosis: No Has patient had a PCN reaction that required hospitalization Yes Has patient had a PCN reaction occurring within the last 10 years: No If all of the above answers are "NO", then may proceed with Cephalosporin use.   [name] Doxycycline Other (See Comments)    Intolerance    No current facility-administered medications on file prior to encounter.   Current Outpatient Medications on File Prior to Encounter  Medication Sig Dispense Refill  . acetaminophen (TYLENOL) 325 MG tablet Take 650 mg by mouth every 6 (six) hours as needed for pain or fever.    . ALPRAZolam (XANAX) 0.5 MG tablet Take one tablet by mouth three times a day and two tablets at night 150 tablet 2  . amLODipine (NORVASC) 10 MG tablet Take 1 tablet (10 mg total) by mouth daily. 90 tablet 3  . budesonide-formoterol (SYMBICORT) 80-4.5 MCG/ACT inhaler Inhale 2 puffs into the lungs 2 (two) times daily as needed.     . calcium carbonate (TUMS - DOSED IN MG ELEMENTAL  CALCIUM) 500 MG chewable tablet Chew 1 tablet by mouth daily as needed for indigestion or heartburn.    . cyclobenzaprine (FLEXERIL) 10 MG tablet Take 1 tablet (10 mg total) by mouth 3 (three) times daily. (Patient taking differently: Take 10 mg by mouth as needed.) 21 tablet 0  . esomeprazole (NEXIUM) 40 MG capsule Take 1 capsule (40 mg total) by mouth 2 (two) times daily before a meal. 60 capsule 5  . HYDROcodone-acetaminophen (NORCO/VICODIN) 5-325 MG tablet Take one to two tablets every 4-6 hours as needed for pain 40 tablet 0  . JANUVIA 100 MG tablet TAKE 1 Tablet BY MOUTH ONCE EVERY DAY 90 tablet 0  . metoprolol succinate (TOPROL-XL) 50 MG 24 hr tablet Take 1 and 1/2 tabs by mouth  with or immediately following a meal. 135 tablet 3  . nitroGLYCERIN (NITROSTAT) 0.4 MG SL tablet Place 1 tablet (0.4 mg total) under the tongue every 5 (five) minutes x 3 doses as needed for chest pain (if no relief after 3rd dose, proceed to the ED for an evaluation). 25 tablet 3  . PROVENTIL HFA 108 (90 Base) MCG/ACT inhaler INHALE 2 PUFFS BY MOUTH EVERY 6 HOURS AS NEEDED FOR COUGHING, WHEEZING, OR SHORTNESS OF BREATH 20.1 g 1  . simvastatin (ZOCOR) 20 MG tablet TAKE 1 TABLET BY MOUTH ONCE DAILY AT BEDTIME 30 tablet 3  . XARELTO 20 MG TABS tablet Take 1 tablet (20 mg total) by mouth daily with supper. 90 tablet 3   Social History   Socioeconomic History  . Marital status: Married    Spouse name: Not on file  . Number of children: Not on file  . Years of education: Not on file  . Highest education level: Not on file  Occupational History  . Occupation: full time    Employer: UNEMPLOYED  Tobacco Use  . Smoking status: Current Every Day Smoker    Packs/day: 0.25    Years: 25.00    Pack years: 6.25    Types: Cigarettes    Start date: 07/17/1982  . Smokeless tobacco: Never Used  . Tobacco comment: "smoking lightly"  Vaping Use  . Vaping Use: Never used  Substance and Sexual Activity  . Alcohol use: No     Alcohol/week: 0.0 standard drinks  . Drug use: No  . Sexual activity: Yes    Birth control/protection: None  Other Topics Concern  . Not on file  Social History Narrative   Pt lives in [place] with wife.  5 children.  Unemployed due to panic attacks and back pain   Social Determinants of Health   Financial Resource Strain: Not on file  Food Insecurity: Not on file  Transportation Needs: Not on file  Physical Activity: Not on file  Stress: Not on file  Social Connections: Not on file  Intimate Partner Violence: Not on file   Family History  Problem Relation Age of Onset  . Lung cancer Mother   . Alcohol abuse Mother   . Heart attack Father 58  . Diabetes Father   . Alcohol abuse Father   . Hypertension Brother   . Hypertension Brother   . Anxiety disorder Sister   . Depression Sister   . Anxiety disorder Sister   . Heart attack Brother 77  . Diabetes Brother   . Hypertension Brother   . Seizures Brother   . Dementia Paternal Uncle   . Dementia Cousin   . ADD / ADHD Daughter   . Colon cancer Neg Hx   . Drug abuse Neg Hx   . Bipolar disorder Neg Hx   . OCD Neg Hx   . Paranoid behavior Neg Hx   . Schizophrenia Neg Hx   . Sexual abuse Neg Hx   . Physical abuse Neg Hx     OBJECTIVE:  Vitals:   07/03/20 1525 07/03/20 1527  BP:  (!) 159/84  Pulse:  65  Resp:  18  Temp:  98.1 F (36.7 C)  TempSrc:  Oral  SpO2:  97%  Weight: 193 lb (87.5 kg)   Height: 5\' 11"  (1.803 m)      General appearance: alert; appears fatigued, but nontoxic; speaking in full sentences and tolerating own secretions HEENT: NCAT; Ears: EACs clear, TMs pearly gray; Eyes: PERRL.  EOM grossly intact. Sinuses: nontender; Nose: nares patent without rhinorrhea, Throat: oropharynx clear, tonsils non erythematous or enlarged, uvula midline  Neck: supple without LAD Lungs: unlabored respirations, symmetrical air entry; cough: moderate; no respiratory distress; CTAB Heart: regular rate and  rhythm.  Radial pulses 2+ symmetrical bilaterally Skin: warm and dry Psychological: alert and cooperative; normal mood and affect  LABS:  No results found for this or any previous visit (from the past 24 hour(s)).   ASSESSMENT & PLAN:  1. URI with cough and congestion   2. Encounter for screening for COVID-19     Meds ordered this encounter  Medications  . fluticasone (FLONASE) 50 MCG/ACT nasal spray    Sig: Place 1 spray into both nostrils daily for 14 days.    Dispense:  16 g    Refill:  0  . cetirizine (ZYRTEC ALLERGY) 10 MG tablet    Sig: Take 1 tablet (10 mg total) by mouth daily.    Dispense:  30 tablet    Refill:  0  . benzonatate (TESSALON) 100 MG capsule    Sig: Take 1 capsule (100 mg total) by mouth 3 (three) times daily as needed for cough.    Dispense:  30 capsule    Refill:  0  . predniSONE (DELTASONE) 10 MG tablet    Sig: Take 2 tablets (20 mg total) by mouth daily.    Dispense:  15 tablet    Refill:  0   Discharge Instructions   COVID testing ordered.  It will take between 2-7 days for test results.  Someone will contact you regarding abnormal results.    In the meantime: You should remain isolated in your home for 10 days from symptom onset AND greater than 24  hours after symptoms resolution (absence of fever without the use of fever-reducing medication and improvement in respiratory symptoms), whichever is longer Get plenty of rest and push fluids Tessalon Perles prescribed for cough Zyrtec for nasal congestion, runny nose, and/or sore throat Flonase for nasal congestion and runny nose Prednisone was prescribed Use medications daily for symptom relief Use OTC medications  like ibuprofen or tylenol as needed fever or pain Call or go to the ED if you have any new or worsening symptoms such as fever, worsening cough, shortness of breath, chest tightness, chest pain, turning blue, changes in mental status, etc...   Reviewed expectations re: course of  current medical issues. Questions answered. Outlined signs and symptoms indicating need for more acute intervention. Patient verbalized understanding. After Visit Summary given.         Emerson Monte, Lee Mont 07/03/20 1544

## 2020-07-03 NOTE — ED Triage Notes (Signed)
Cough, fever, congestion and headache for past 2 days

## 2020-07-03 NOTE — Discharge Instructions (Signed)
COVID testing ordered.  It will take between 2-7 days for test results.  Someone will contact you regarding abnormal results.    In the meantime: You should remain isolated in your home for 10 days from symptom onset AND greater than 24  hours after symptoms resolution (absence of fever without the use of fever-reducing medication and improvement in respiratory symptoms), whichever is longer Get plenty of rest and push fluids Tessalon Perles prescribed for cough Zyrtec for nasal congestion, runny nose, and/or sore throat Flonase for nasal congestion and runny nose Prednisone was prescribed Use medications daily for symptom relief Use OTC medications like ibuprofen or tylenol as needed fever or pain Call or go to the ED if you have any new or worsening symptoms such as fever, worsening cough, shortness of breath, chest tightness, chest pain, turning blue, changes in mental status, etc..[name]

## 2020-07-05 ENCOUNTER — Telehealth: Payer: Self-pay | Admitting: Orthopaedic Surgery

## 2020-07-05 ENCOUNTER — Other Ambulatory Visit: Payer: Self-pay | Admitting: Physician Assistant

## 2020-07-05 DIAGNOSIS — E785 Hyperlipidemia, unspecified: Secondary | ICD-10-CM

## 2020-07-05 DIAGNOSIS — I10 Essential (primary) hypertension: Secondary | ICD-10-CM

## 2020-07-05 DIAGNOSIS — E118 Type 2 diabetes mellitus with unspecified complications: Secondary | ICD-10-CM

## 2020-07-05 NOTE — Telephone Encounter (Signed)
Virtual visits still have a charge.  Not sure how he wants to proceed.  He can cancel if he wishes and we can send this message to Dr Luna Glasgow to advise and see if he would call results to patient vs visit charge of any sort.

## 2020-07-05 NOTE — Telephone Encounter (Signed)
Patien's designated contact, wife [name], relays he will have his discount with [facility] only until 07/18/19. Due to process of re-issuing of 100% [facility] discount, may patient schedule for review of MRI results (?virtually) with one of our other providers?

## 2020-07-06 NOTE — Telephone Encounter (Signed)
Called patient (5:32pm) - Voiced understanding. Will leave appointment as scheduled at this time, and will call back after MRI to let us know if will keep appointment for this date, or move to a later date based on [facility] discount.

## 2020-07-08 LAB — COVID-19, FLU A+B NAA
Influenza A, NAA: NOT DETECTED
Influenza B, NAA: NOT DETECTED
SARS-CoV-2, NAA: NOT DETECTED

## 2020-07-13 ENCOUNTER — Ambulatory Visit (HOSPITAL_COMMUNITY): Payer: Self-pay

## 2020-07-20 ENCOUNTER — Ambulatory Visit: Payer: Self-pay | Admitting: Orthopaedic Surgery

## 2020-07-23 ENCOUNTER — Ambulatory Visit (HOSPITAL_COMMUNITY): Payer: Self-pay

## 2020-07-26 ENCOUNTER — Other Ambulatory Visit: Payer: Self-pay

## 2020-07-26 ENCOUNTER — Ambulatory Visit (HOSPITAL_COMMUNITY)
Admission: RE | Admit: 2020-07-26 | Discharge: 2020-07-26 | Disposition: A | Discharge status: Home / Self Care | Payer: Self-pay | Source: Ambulatory Visit | Attending: Orthopaedic Surgery | Admitting: Orthopaedic Surgery

## 2020-07-26 DIAGNOSIS — G8929 Other chronic pain: Secondary | ICD-10-CM | POA: Insufficient documentation

## 2020-07-26 DIAGNOSIS — M25512 Pain in left shoulder: Secondary | ICD-10-CM | POA: Insufficient documentation

## 2020-07-27 ENCOUNTER — Encounter: Payer: Self-pay | Admitting: Physician Assistant

## 2020-07-27 ENCOUNTER — Ambulatory Visit: Payer: Self-pay | Admitting: Physician Assistant

## 2020-07-27 ENCOUNTER — Other Ambulatory Visit: Payer: Self-pay

## 2020-07-27 ENCOUNTER — Ambulatory Visit: Payer: Self-pay | Admitting: Orthopaedic Surgery

## 2020-07-27 ENCOUNTER — Other Ambulatory Visit: Payer: Self-pay | Admitting: Physician Assistant

## 2020-07-27 DIAGNOSIS — R0981 Nasal congestion: Secondary | ICD-10-CM

## 2020-07-27 DIAGNOSIS — Z20822 Contact with and (suspected) exposure to covid-19: Secondary | ICD-10-CM

## 2020-07-27 NOTE — Progress Notes (Unsigned)
There were no vitals taken for this visit.   Subjective:    Patient ID: Richard [name], male    DOB: 12-18-1965, 55 y.o.   MRN: [id number]  HPI: Richard [name] is a 55 y.o. male presenting on 07/27/2020 for No chief complaint on file.   HPI   This is a telemedicine appointment through [employer] due to coronavirus pandemic.  I connected with  Obi L Arya on 07/27/2020 by a video enabled telemedicine application and verified that I am speaking with the correct person using two identifiers.   I discussed the limitations of evaluation and management by telemedicine. The patient expressed understanding and agreed to proceed.  Pt is in his parked car.  Provider is in office.     Pt was scheduled for in office visit today but reported congestion during covid screening so was changed to virtual appointment.   Pt did not get labs drawn.  He says he wasn't aware he needed to.  He says he has a running nose  He had MRI done yesterday.  He is seeing dr Luna Glasgow for his shoulder  He has to [employer] [employer] financial assistance but is re-filing for it.  He says he got really sick before christmas.    He has still not gotten covid vaccination.  He is still seeiing [name] for [facility]  He reports weight loss, to 185 pounds.    Relevant past medical, surgical, family and social history reviewed and updated as indicated. Interim medical history since our last visit reviewed. Allergies and medications reviewed and updated.   Current Outpatient Medications:  .  acetaminophen (TYLENOL) 325 MG tablet, Take 650 mg by mouth every 6 (six) hours as needed for pain or fever., Disp: , Rfl:  .  ALPRAZolam (XANAX) 0.5 MG tablet, Take one tablet by mouth three times a day and two tablets at night, Disp: 150 tablet, Rfl: 2 .  amLODipine (NORVASC) 10 MG tablet, Take 1 tablet (10 mg total) by mouth daily., Disp: 90 tablet, Rfl: 3 .  budesonide-formoterol (SYMBICORT) 80-4.5 MCG/ACT inhaler, Inhale 2 puffs  into the lungs 2 (two) times daily as needed. , Disp: , Rfl:  .  calcium carbonate (TUMS - DOSED IN MG ELEMENTAL CALCIUM) 500 MG chewable tablet, Chew 1 tablet by mouth daily as needed for indigestion or heartburn., Disp: , Rfl:  .  cetirizine (ZYRTEC ALLERGY) 10 MG tablet, Take 1 tablet (10 mg total) by mouth daily., Disp: 30 tablet, Rfl: 0 .  cyclobenzaprine (FLEXERIL) 10 MG tablet, Take 1 tablet (10 mg total) by mouth 3 (three) times daily. (Patient taking differently: Take 10 mg by mouth as needed.), Disp: 21 tablet, Rfl: 0 .  esomeprazole (NEXIUM) 40 MG capsule, Take 1 capsule (40 mg total) by mouth 2 (two) times daily before a meal., Disp: 60 capsule, Rfl: 5 .  JANUVIA 100 MG tablet, TAKE 1 Tablet BY MOUTH ONCE EVERY DAY, Disp: 90 tablet, Rfl: 0 .  metoprolol succinate (TOPROL-XL) 50 MG 24 hr tablet, Take 1 and 1/2 tabs by mouth  with or immediately following a meal., Disp: 135 tablet, Rfl: 3 .  PROVENTIL HFA 108 (90 Base) MCG/ACT inhaler, INHALE 2 PUFFS BY MOUTH EVERY 6 HOURS AS NEEDED FOR COUGHING, WHEEZING, OR SHORTNESS OF BREATH, Disp: 20.1 g, Rfl: 1 .  simvastatin (ZOCOR) 20 MG tablet, TAKE 1 TABLET BY MOUTH ONCE DAILY AT BEDTIME, Disp: 30 tablet, Rfl: 3 .  XARELTO 20 MG TABS tablet, Take 1 tablet (20  mg total) by mouth daily with supper., Disp: 90 tablet, Rfl: 3 .  benzonatate (TESSALON) 100 MG capsule, Take 1 capsule (100 mg total) by mouth 3 (three) times daily as needed for cough. (Patient not taking: Reported on 07/27/2020), Disp: 30 capsule, Rfl: 0 .  fluticasone (FLONASE) 50 MCG/ACT nasal spray, Place 1 spray into both nostrils daily for 14 days. (Patient not taking: Reported on 07/27/2020), Disp: 16 g, Rfl: 0 .  HYDROcodone-acetaminophen (NORCO/VICODIN) 5-325 MG tablet, Take one to two tablets every 4-6 hours as needed for pain (Patient not taking: Reported on 07/27/2020), Disp: 40 tablet, Rfl: 0 .  nitroGLYCERIN (NITROSTAT) 0.4 MG SL tablet, Place 1 tablet (0.4 mg total) under the  tongue every 5 (five) minutes x 3 doses as needed for chest pain (if no relief after 3rd dose, proceed to the ED for an evaluation). (Patient not taking: Reported on 07/27/2020), Disp: 25 tablet, Rfl: 3     Review of Systems  Per HPI unless specifically indicated above     Objective:    There were no vitals taken for this visit.  Wt Readings from Last 3 Encounters:  07/03/20 193 lb (87.5 kg)  06/29/20 194 lb (88 kg)  06/03/20 194 lb 12.8 oz (88.4 kg)    Physical Exam Constitutional:      General: He is not in acute distress.    Appearance: He is not toxic-appearing.  HENT:     Head: Normocephalic and atraumatic.  Pulmonary:     Effort: Pulmonary effort is normal. No respiratory distress.     Comments: Pt speaking in complete sentences without dyspnea. Neurological:     Mental Status: He is alert and oriented to person, place, and time.  Psychiatric:        Attention and Perception: Attention normal.        Speech: Speech normal.        Behavior: Behavior is cooperative.             Assessment & Plan:    Encounter Diagnoses  Name Primary?  . Person under investigation for COVID-19 Yes  . Nasal congestion      Pt is scheduled for Today to get covid test.  He is counseled on self-quarantine until he gets his results.  He is to use OTCs as needed for his symptoms.  If negative, he is to get Fasting labs Monday and have office visit Tuesday  Recommended he get covid vaccination if test is negative.

## 2020-07-27 NOTE — Patient Instructions (Signed)
Today - covid test  If negative  Fasting labs Monday and office visit Tuesday

## 2020-07-29 LAB — NOVEL CORONAVIRUS, NAA: SARS-CoV-2, NAA: NOT DETECTED

## 2020-07-29 LAB — SARS-COV-2, NAA 2 DAY TAT

## 2020-08-03 ENCOUNTER — Ambulatory Visit: Payer: Self-pay | Admitting: Physician Assistant

## 2020-08-09 ENCOUNTER — Ambulatory Visit: Payer: Self-pay | Admitting: Physician Assistant

## 2020-08-10 ENCOUNTER — Other Ambulatory Visit (HOSPITAL_COMMUNITY)
Admission: RE | Admit: 2020-08-10 | Discharge: 2020-08-10 | Disposition: A | Discharge status: Home / Self Care | Payer: Self-pay | Source: Ambulatory Visit | Attending: Physician Assistant | Admitting: Physician Assistant

## 2020-08-10 ENCOUNTER — Other Ambulatory Visit: Payer: Self-pay

## 2020-08-10 DIAGNOSIS — E785 Hyperlipidemia, unspecified: Secondary | ICD-10-CM | POA: Insufficient documentation

## 2020-08-10 DIAGNOSIS — I10 Essential (primary) hypertension: Secondary | ICD-10-CM | POA: Insufficient documentation

## 2020-08-10 DIAGNOSIS — E118 Type 2 diabetes mellitus with unspecified complications: Secondary | ICD-10-CM | POA: Insufficient documentation

## 2020-08-10 LAB — COMPREHENSIVE METABOLIC PANEL
ALT: 15 U/L (ref 0–44)
AST: 13 U/L — ABNORMAL LOW (ref 15–41)
Albumin: 4.1 g/dL (ref 3.5–5.0)
Alkaline Phosphatase: 48 U/L (ref 38–126)
Anion gap: 8 (ref 5–15)
BUN: 16 mg/dL (ref 6–20)
CO2: 26 mmol/L (ref 22–32)
Calcium: 9 mg/dL (ref 8.9–10.3)
Chloride: 102 mmol/L (ref 98–111)
Creatinine, Ser: 1.04 mg/dL (ref 0.61–1.24)
GFR, Estimated: >60 mL/min (ref >60)
Glucose, Bld: 128 mg/dL — ABNORMAL HIGH (ref 70–99)
Potassium: 4.4 mmol/L (ref 3.5–5.1)
Sodium: 136 mmol/L (ref 135–145)
Total Bilirubin: 0.5 mg/dL (ref 0.3–1.2)
Total Protein: 7.1 g/dL (ref 6.5–8.1)

## 2020-08-10 LAB — LIPID PANEL
Cholesterol: 109 mg/dL (ref 0–200)
HDL: 31 mg/dL — ABNORMAL LOW (ref >40)
LDL Cholesterol: 58 mg/dL (ref 0–99)
Total CHOL/HDL Ratio: 3.5 RATIO
Triglycerides: 101 mg/dL (ref <150)
VLDL: 20 mg/dL (ref 0–40)

## 2020-08-10 LAB — HEMOGLOBIN A1C
Hgb A1c MFr Bld: 6.6 % — ABNORMAL HIGH (ref 4.8–5.6)
Mean Plasma Glucose: 142.72 mg/dL

## 2020-08-11 ENCOUNTER — Encounter: Payer: Self-pay | Admitting: Physician Assistant

## 2020-08-11 ENCOUNTER — Ambulatory Visit: Payer: Self-pay | Admitting: Physician Assistant

## 2020-08-11 VITALS — BP 120/76 | HR 55 | Temp 97.1°F | Ht 71.0 in | Wt 192.0 lb

## 2020-08-11 DIAGNOSIS — K219 Gastro-esophageal reflux disease without esophagitis: Secondary | ICD-10-CM

## 2020-08-11 DIAGNOSIS — E118 Type 2 diabetes mellitus with unspecified complications: Secondary | ICD-10-CM

## 2020-08-11 DIAGNOSIS — Z7901 Long term (current) use of anticoagulants: Secondary | ICD-10-CM

## 2020-08-11 DIAGNOSIS — F17200 Nicotine dependence, unspecified, uncomplicated: Secondary | ICD-10-CM

## 2020-08-11 DIAGNOSIS — I10 Essential (primary) hypertension: Secondary | ICD-10-CM

## 2020-08-11 DIAGNOSIS — J449 Chronic obstructive pulmonary disease, unspecified: Secondary | ICD-10-CM

## 2020-08-11 DIAGNOSIS — F39 Unspecified mood [affective] disorder: Secondary | ICD-10-CM

## 2020-08-11 DIAGNOSIS — E785 Hyperlipidemia, unspecified: Secondary | ICD-10-CM

## 2020-08-11 NOTE — Progress Notes (Signed)
BP 120/76   Pulse (!) 55   Temp (!) 97.1 F (36.2 C)   Ht 5\' 11"  (1.803 m)   Wt 192 lb (87.1 kg)   SpO2 96%   BMI 26.78 kg/m    Subjective:    Patient ID: Richard [name], male    DOB: 1965/10/11, 55 y.o.   MRN: [id number]  HPI: Richard [name] is a 55 y.o. male presenting on 08/11/2020 for Follow-up   HPI    Pt had a negative covid 19 screening questionnaire.   Pt is 42yoM with DM, MH issues, HTN, dyslipidemia, COPD, gerd, long-term anticoagulation and PAF.   He is Not vaccinated for covid but says he is considering it  He Sees orthopedics for shoulder and neck problems He Sees psychiatrist for [place] issues He Sees GI for gerd He Sees cardiology for CAD and PAF    He also has DM   He didn't bring his meds and ins't exactly sure what he is taking.  He says he is doing pretty good and has no new problems.    He is still smoking.       Relevant past medical, surgical, family and social history reviewed and updated as indicated. Interim medical history since our last visit reviewed. Allergies and medications reviewed and updated.   Current Outpatient Medications:  .  acetaminophen (TYLENOL) 325 MG tablet, Take 650 mg by mouth every 6 (six) hours as needed for pain or fever., Disp: , Rfl:  .  ALPRAZolam (XANAX) 0.5 MG tablet, Take one tablet by mouth three times a day and two tablets at night, Disp: 150 tablet, Rfl: 2 .  budesonide-formoterol (SYMBICORT) 80-4.5 MCG/ACT inhaler, Inhale 2 puffs into the lungs 2 (two) times daily as needed. , Disp: , Rfl:  .  calcium carbonate (TUMS - DOSED IN MG ELEMENTAL CALCIUM) 500 MG chewable tablet, Chew 1 tablet by mouth daily as needed for indigestion or heartburn., Disp: , Rfl:  .  cetirizine (ZYRTEC ALLERGY) 10 MG tablet, Take 1 tablet (10 mg total) by mouth daily., Disp: 30 tablet, Rfl: 0 .  cyclobenzaprine (FLEXERIL) 10 MG tablet, Take 1 tablet (10 mg total) by mouth 3 (three) times daily. (Patient taking differently: Take 10  mg by mouth as needed.), Disp: 21 tablet, Rfl: 0 .  esomeprazole (NEXIUM) 40 MG capsule, Take 1 capsule (40 mg total) by mouth 2 (two) times daily before a meal., Disp: 60 capsule, Rfl: 5 .  JANUVIA 100 MG tablet, TAKE 1 Tablet BY MOUTH ONCE DAILY, Disp: 90 tablet, Rfl: 1 .  metoprolol succinate (TOPROL-XL) 50 MG 24 hr tablet, Take 1 and 1/2 tabs by mouth  with or immediately following a meal., Disp: 135 tablet, Rfl: 3 .  nitroGLYCERIN (NITROSTAT) 0.4 MG SL tablet, Place 1 tablet (0.4 mg total) under the tongue every 5 (five) minutes x 3 doses as needed for chest pain (if no relief after 3rd dose, proceed to the ED for an evaluation)., Disp: 25 tablet, Rfl: 3 .  PROVENTIL HFA 108 (90 Base) MCG/ACT inhaler, INHALE 2 PUFFS BY MOUTH EVERY 6 HOURS AS NEEDED FOR COUGHING, WHEEZING, OR SHORTNESS OF BREATH, Disp: 13.4 g, Rfl: 1 .  simvastatin (ZOCOR) 20 MG tablet, TAKE 1 TABLET BY MOUTH ONCE DAILY AT BEDTIME, Disp: 30 tablet, Rfl: 3 .  XARELTO 20 MG TABS tablet, Take 1 tablet (20 mg total) by mouth daily with supper., Disp: 90 tablet, Rfl: 3 .  amLODipine (NORVASC) 10 MG tablet, Take 1  tablet (10 mg total) by mouth daily., Disp: 90 tablet, Rfl: 3 .  benzonatate (TESSALON) 100 MG capsule, Take 1 capsule (100 mg total) by mouth 3 (three) times daily as needed for cough. (Patient not taking: No sig reported), Disp: 30 capsule, Rfl: 0 .  fluticasone (FLONASE) 50 MCG/ACT nasal spray, Place 1 spray into both nostrils daily for 14 days. (Patient not taking: No sig reported), Disp: 16 g, Rfl: 0 .  HYDROcodone-acetaminophen (NORCO/VICODIN) 5-325 MG tablet, Take one to two tablets every 4-6 hours as needed for pain (Patient not taking: No sig reported), Disp: 40 tablet, Rfl: 0    Review of Systems  Per HPI unless specifically indicated above     Objective:    BP 120/76   Pulse (!) 55   Temp (!) 97.1 F (36.2 C)   Ht 5\' 11"  (1.803 m)   Wt 192 lb (87.1 kg)   SpO2 96%   BMI 26.78 kg/m   Wt Readings from  Last 3 Encounters:  08/11/20 192 lb (87.1 kg)  07/03/20 193 lb (87.5 kg)  06/29/20 194 lb (88 kg)    Physical Exam Constitutional:      General: He is not in acute distress.    Appearance: He is not ill-appearing.  HENT:     Head: Normocephalic and atraumatic.  Cardiovascular:     Rate and Rhythm: Normal rate and regular rhythm.  Pulmonary:     Effort: Pulmonary effort is normal. No respiratory distress.     Breath sounds: Normal breath sounds. No wheezing or rhonchi.  Abdominal:     General: Bowel sounds are normal. There is no distension.     Palpations: Abdomen is soft. There is no mass.     Tenderness: There is no abdominal tenderness.  Musculoskeletal:     Right lower leg: No edema.     Left lower leg: No edema.  Neurological:     Mental Status: He is alert and oriented to person, place, and time.  Psychiatric:        Attention and Perception: Attention normal.        Speech: Speech normal.        Behavior: Behavior is cooperative.     Comments: Pt is in good spirits today.     Results for orders placed or performed during the hospital encounter of 08/10/20  Lipid panel  Result Value Ref Range   Cholesterol 109 0 - 200 mg/dL   Triglycerides 101 <150 mg/dL   HDL 31 (L) >40 mg/dL   Total CHOL/HDL Ratio 3.5 RATIO   VLDL 20 0 - 40 mg/dL   LDL Cholesterol 58 0 - 99 mg/dL  Hemoglobin A1c  Result Value Ref Range   Hgb A1c MFr Bld 6.6 (H) 4.8 - 5.6 %   Mean Plasma Glucose 142.72 mg/dL  Comprehensive metabolic panel  Result Value Ref Range   Sodium 136 135 - 145 mmol/L   Potassium 4.4 3.5 - 5.1 mmol/L   Chloride 102 98 - 111 mmol/L   CO2 26 22 - 32 mmol/L   Glucose, Bld 128 (H) 70 - 99 mg/dL   BUN 16 6 - 20 mg/dL   Creatinine, Ser 1.04 0.61 - 1.24 mg/dL   Calcium 9.0 8.9 - 10.3 mg/dL   Total Protein 7.1 6.5 - 8.1 g/dL   Albumin 4.1 3.5 - 5.0 g/dL   AST 13 (L) 15 - 41 U/L   ALT 15 0 - 44 U/L   Alkaline Phosphatase 48  38 - 126 U/L   Total Bilirubin 0.5 0.3 - 1.2  mg/dL   GFR, Estimated >60 >60 mL/min   Anion gap 8 5 - 15      Assessment & Plan:    Encounter Diagnoses  Name Primary?  . Controlled diabetes mellitus type 2 with complications, unspecified whether long term insulin use ([place]) Yes  . Essential hypertension   . Hyperlipidemia, unspecified hyperlipidemia type   . Tobacco use disorder   . Chronic obstructive pulmonary disease, unspecified COPD type ([place])   . Gastroesophageal reflux disease, unspecified whether esophagitis present   . Anticoagulant long-term use   . Mood disorder ([place])      -Reviewed labs with pt  -refer for annual dm eye exam -pt to Continue current meds -pt to continue with specialists per their recomendation -pt encouraged to get covid vaccination -encouraged smoking cessation -pt to follow up 4  Months.  He is to contact office sooner prn

## 2020-08-16 ENCOUNTER — Other Ambulatory Visit: Payer: Self-pay | Admitting: Physician Assistant

## 2020-09-01 ENCOUNTER — Ambulatory Visit: Payer: Self-pay | Admitting: Physician Assistant

## 2020-09-01 ENCOUNTER — Encounter: Payer: Self-pay | Admitting: Physician Assistant

## 2020-09-01 DIAGNOSIS — F39 Unspecified mood [affective] disorder: Secondary | ICD-10-CM

## 2020-09-01 DIAGNOSIS — E785 Hyperlipidemia, unspecified: Secondary | ICD-10-CM

## 2020-09-01 DIAGNOSIS — J449 Chronic obstructive pulmonary disease, unspecified: Secondary | ICD-10-CM

## 2020-09-01 DIAGNOSIS — K219 Gastro-esophageal reflux disease without esophagitis: Secondary | ICD-10-CM

## 2020-09-01 DIAGNOSIS — R5381 Other malaise: Secondary | ICD-10-CM

## 2020-09-01 DIAGNOSIS — I10 Essential (primary) hypertension: Secondary | ICD-10-CM

## 2020-09-01 DIAGNOSIS — F17200 Nicotine dependence, unspecified, uncomplicated: Secondary | ICD-10-CM

## 2020-09-01 DIAGNOSIS — E118 Type 2 diabetes mellitus with unspecified complications: Secondary | ICD-10-CM

## 2020-09-01 DIAGNOSIS — Z7901 Long term (current) use of anticoagulants: Secondary | ICD-10-CM

## 2020-09-01 MED ORDER — MECLIZINE HCL 25 MG PO TABS: 25.0000 mg | ORAL_TABLET | Freq: Three times a day (TID) | ORAL | 0 refills | Status: DC | PRN

## 2020-09-01 NOTE — Progress Notes (Signed)
There were no vitals taken for this visit.   Subjective:    Patient ID: Richard [name], male    DOB: 1965-12-23, 55 y.o.   MRN: [id number]  HPI: Richard [name] is a 55 y.o. male presenting on 09/01/2020 for No chief complaint on file.   HPI   This is a telemedicine appointment due to coronavirus pandemic.  It is via Telephone as pt does not have a video enabled device.  I connected with  Richard [name] on 09/01/20 by a video enabled telemedicine application and verified that I am speaking with the correct person using two identifiers.   I discussed the limitations of evaluation and management by telemedicine. The patient expressed understanding and agreed to proceed.  Pt is at home.  Provider is at office.   Pt is 67yoM with DM, MH issues, HTN, dyslipidemia, COPD, gerd, long-term anticoagulation and PAF.  He is Not vaccinated for covid   He Sees orthopedics for shoulder and neck problems He Sees psychiatrist for [place] issues He Sees GI for gerd He Sees cardiology for CAD and PAF    Pt scheduled appointment today for "muscle pain".  He complains that he feels sick when he looks at certain things.  Also he says he has eye and muscle twitches.  He says that he has No "muscle pain" but says they "feel sore".   He says he can no longer take the trash out because he "gives out real quick".  Additionally, he reports that he's having issues with his anxiety again   He has bp machine but when he attempled it keeps coming up error.    Pt inquires about co q10.  He says his wife has been doing a lot of looking on the internet.    Pt says that for the Past week, he has felt the sensation of the room spinning when he's just sitting around.  He says it happens 2 or3 times/day for last 20-30 minutes.      Relevant past medical, surgical, family and social history reviewed and updated as indicated. Interim medical history since our last visit reviewed. Allergies and medications reviewed and  updated.   Current Outpatient Medications:  .  ALPRAZolam (XANAX) 0.5 MG tablet, Take one tablet by mouth three times a day and two tablets at night, Disp: 150 tablet, Rfl: 2 .  aluminum-magnesium hydroxide 200-200 MG/5ML suspension, Take by mouth every 6 (six) hours as needed for indigestion., Disp: , Rfl:  .  amLODipine (NORVASC) 10 MG tablet, Take 1 tablet (10 mg total) by mouth daily., Disp: 90 tablet, Rfl: 3 .  budesonide-formoterol (SYMBICORT) 80-4.5 MCG/ACT inhaler, Inhale 2 puffs into the lungs 2 (two) times daily as needed. , Disp: , Rfl:  .  calcium carbonate (TUMS - DOSED IN MG ELEMENTAL CALCIUM) 500 MG chewable tablet, Chew 1 tablet by mouth daily as needed for indigestion or heartburn., Disp: , Rfl:  .  cyclobenzaprine (FLEXERIL) 10 MG tablet, Take 1 tablet (10 mg total) by mouth 3 (three) times daily. (Patient taking differently: Take 10 mg by mouth as needed.), Disp: 21 tablet, Rfl: 0 .  esomeprazole (NEXIUM) 40 MG capsule, Take 1 capsule (40 mg total) by mouth 2 (two) times daily before a meal., Disp: 60 capsule, Rfl: 5 .  HYDROcodone-acetaminophen (NORCO/VICODIN) 5-325 MG tablet, Take one to two tablets every 4-6 hours as needed for pain, Disp: 40 tablet, Rfl: 0 .  JANUVIA 100 MG tablet, TAKE 1 Tablet BY MOUTH  ONCE DAILY, Disp: 90 tablet, Rfl: 1 .  metFORMIN (GLUCOPHAGE) 500 MG tablet, Take 500 mg by mouth 2 (two) times daily with a meal., Disp: , Rfl:  .  metoprolol succinate (TOPROL-XL) 50 MG 24 hr tablet, Take 1 and 1/2 tabs by mouth  with or immediately following a meal., Disp: 135 tablet, Rfl: 3 .  PROVENTIL HFA 108 (90 Base) MCG/ACT inhaler, INHALE 2 PUFFS BY MOUTH EVERY 6 HOURS AS NEEDED FOR COUGHING, WHEEZING, OR SHORTNESS OF BREATH, Disp: 13.4 g, Rfl: 1 .  simvastatin (ZOCOR) 20 MG tablet, TAKE 1 TABLET BY MOUTH AT BEDTIME, Disp: 30 tablet, Rfl: 6 .  XARELTO 20 MG TABS tablet, Take 1 tablet (20 mg total) by mouth daily with supper., Disp: 90 tablet, Rfl: 3 .  cetirizine  (ZYRTEC ALLERGY) 10 MG tablet, Take 1 tablet (10 mg total) by mouth daily. (Patient not taking: Reported on 09/01/2020), Disp: 30 tablet, Rfl: 0 .  nitroGLYCERIN (NITROSTAT) 0.4 MG SL tablet, Place 1 tablet (0.4 mg total) under the tongue every 5 (five) minutes x 3 doses as needed for chest pain (if no relief after 3rd dose, proceed to the ED for an evaluation). (Patient not taking: Reported on 09/01/2020), Disp: 25 tablet, Rfl: 3     Review of Systems  Per HPI unless specifically indicated above     Objective:    There were no vitals taken for this visit.  Wt Readings from Last 3 Encounters:  08/11/20 192 lb (87.1 kg)  07/03/20 193 lb (87.5 kg)  06/29/20 194 lb (88 kg)    Physical Exam Pulmonary:     Effort: No respiratory distress.     Comments: Pt is talking in complete sentences wtihout sob Neurological:     Mental Status: He is alert and oriented to person, place, and time.  Psychiatric:        Attention and Perception: Attention normal.        Speech: Speech normal.             Assessment & Plan:    Encounter Diagnoses  Name Primary?  . Controlled diabetes mellitus type 2 with complications, unspecified whether long term insulin use ([place]) Yes  . Essential hypertension   . Hyperlipidemia, unspecified hyperlipidemia type   . Tobacco use disorder   . Chronic obstructive pulmonary disease, unspecified COPD type ([place])   . Gastroesophageal reflux disease, unspecified whether esophagitis present   . Anticoagulant long-term use   . Mood disorder ([place])   . Physical deconditioning      Needs eye appt and PT.  Discussed changes from sivastatin to atorvastatin due to cost.  He wants to stay on simvastatin  Ok if he wants to take co q10  rx Meclizine-   Discussed deconditioning.  Encouraged pt to gradually increase activity levels.  Will try self activity before sending to PT due to lack of insurance   Pt is on list for DM eye exam  He has appt to follow up in  May.  He is to contact office sooner prn  (Spent 33 minutes on phone with pt)

## 2020-09-08 ENCOUNTER — Ambulatory Visit: Payer: Self-pay | Admitting: Physician Assistant

## 2020-09-08 ENCOUNTER — Encounter: Payer: Self-pay | Admitting: Physician Assistant

## 2020-09-08 DIAGNOSIS — K0889 Other specified disorders of teeth and supporting structures: Secondary | ICD-10-CM

## 2020-09-08 MED ORDER — CLINDAMYCIN HCL 300 MG PO CAPS: 300.0000 mg | ORAL_CAPSULE | Freq: Three times a day (TID) | ORAL | 0 refills | Status: DC

## 2020-09-08 NOTE — Progress Notes (Signed)
   There were no vitals taken for this visit.   Subjective:    Patient ID: Richard [name], male    DOB: [birth year], 55 y.o.   MRN: [id number]  HPI: Richard [name] is a 55 y.o. male presenting on 09/08/2020 for No chief complaint on file.   HPI  This is a telemedicine appointment due to coronavirus pandemic.  It is via telephone as pt does not have a video enabled device.  I connected with  Richard [name] on 09/08/20 by a video enabled telemedicine application and verified that I am speaking with the correct person using two identifiers.   I discussed the limitations of evaluation and management by telemedicine. The patient expressed understanding and agreed to proceed.  Pt is at home.  Provider is at office.    Pt c/o toothache.  Pt says tooth in the front with pain radiating up into the sinus.  It Started since Saturday.  He reports No trouble talking.  He says its "Not swolled up too bad".   He says Eating hurts.   Relevant past medical, surgical, family and social history reviewed and updated as indicated. Interim medical history since our last visit reviewed. Allergies and medications reviewed and updated.  Review of Systems  Per HPI unless specifically indicated above     Objective:    There were no vitals taken for this visit.  Wt Readings from Last 3 Encounters:  08/11/20 192 lb (87.1 kg)  07/03/20 193 lb (87.5 kg)  06/29/20 194 lb (88 kg)    Physical Exam HENT:     Head:     Comments: Speech is clear Pulmonary:     Effort: No respiratory distress.     Comments: Pt is talking in complete sentences without sob. Neurological:     Mental Status: He is alert and oriented to person, place, and time.  Psychiatric:        Attention and Perception: Attention normal.        Speech: Speech normal.        Behavior: Behavior is cooperative.               Assessment & Plan:     Encounter Diagnosis  Name Primary?  Paschal Dopp Yes     -rx  clindamycin -pt is put on Dental list -pt to follow up as scheduled

## 2020-09-13 ENCOUNTER — Encounter (HOSPITAL_COMMUNITY): Payer: Self-pay | Admitting: Psychiatry

## 2020-09-13 ENCOUNTER — Other Ambulatory Visit: Payer: Self-pay

## 2020-09-13 ENCOUNTER — Telehealth (INDEPENDENT_AMBULATORY_CARE_PROVIDER_SITE_OTHER): Payer: Self-pay | Admitting: Psychiatry

## 2020-09-13 ENCOUNTER — Encounter (HOSPITAL_COMMUNITY): Payer: Self-pay

## 2020-09-13 DIAGNOSIS — F4001 Agoraphobia with panic disorder: Secondary | ICD-10-CM

## 2020-09-13 MED ORDER — ALPRAZOLAM 0.5 MG PO TABS
ORAL_TABLET | ORAL | 2 refills | Status: DC
Start: 2020-09-13 — End: 2020-11-24

## 2020-09-13 NOTE — Progress Notes (Signed)
Virtual Visit via Telephone Note  I connected with Richard [name] on 09/13/20 at 10:00 AM EST by telephone and verified that I am speaking with the correct person using two identifiers.  Location: Patient: home Provider: home   I discussed the limitations, risks, security and privacy concerns of performing an evaluation and management service by telephone and the availability of in person appointments. I also discussed with the patient that there may be a patient responsible charge related to this service. The patient expressed understanding and agreed to proceed.     I discussed the assessment and treatment plan with the patient. The patient was provided an opportunity to ask questions and all were answered. The patient agreed with the plan and demonstrated an understanding of the instructions.   The patient was advised to call back or seek an in-person evaluation if the symptoms worsen or if the condition fails to improve as anticipated.  I provided 15 minutes of non-face-to-face time during this encounter.   [name], MD  [facility] MD/PA/NP OP Progress Note  09/13/2020 10:12 AM Richard [name]  MRN:  [id number]  Chief Complaint:  Chief Complaint    Anxiety; Follow-up     HPI: Thispatient is a 55 year old Caucasian male who came for his followup appointment. He is currently living with his wife in [place]. He is unemployed and applying for disability.  The patient states that he has been employed for 5 years. 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 He got injured on the job and both shoulders were torn. He has not been able to work ever since and he feels very badly about this. 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  Since he's not been able to work the patient has been having increasingly depressed and anxious. He feels like his body gives out when he is trying to exert himself even in a minor  way.  The patient returns for follow-up after 3 months.  He states that he worries about everything.  He is not really depressed but remains anxious particularly about leaving the house.  The Xanax does help quite a bit.  At times he gets very anxious about taking one of his pills at night and it feels like it is getting stuck in his throat.  It seems like this is the amlodipine and I urged him to talk to his cardiologist about it.  He denies being depressed or having any thoughts of self-harm or suicidal ideation. Visit Diagnosis:    ICD-10-CM   1. 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 Arthritis    "legs, spine, shoulders" (08/12/2015)  . Asthma   . Colitis 1990  . COPD (chronic obstructive pulmonary disease) ([place])   . Depression   . Gastric ulcer 2003; 2012   2003: + esophagitis; negative H.pylori serology  2012: Dr. Oneida Alar, mild gastritis, Bravo PH probe placement, negative H.pylori  . GERD (gastroesophageal reflux disease)   . Hepatic steatosis   . History of hiatal hernia   . Hyperlipemia   . Hypertension   . Mild CAD    a. Cardiac cath 07/2015 showed 65% distal Cx, 20% mid-distal LAD, 20% prox-distal RCA, EF 60%, EDP 98mmHg.  [name] Overweight   . Panic attacks   . Paroxysmal  atrial fibrillation ([place])   . Stroke [facility])    mucsle in left side of face dosent work as its supposed to-only deficit.  [name] TIA (transient ischemic attack)    after cath 1/ 17  . Tobacco abuse    1/2 pack per day  . Type II diabetes mellitus ([place])     Past Surgical History:  Procedure Laterality Date  . BRAVO [place] STUDY  05/03/2011   ZYS:AYTK gastritis/normal esophagus and duodenum  . CARDIAC CATHETERIZATION  1990s X 1; 2005; 08/12/2015  . CARDIAC CATHETERIZATION N/A 08/12/2015   Procedure: Left Heart Cath and Coronary Angiography;  Surgeon: Belva Crome, MD; LAD 20%, CFX 65%, RCA  20%, EF 60%   . COLONOSCOPY  1990  . COLONOSCOPY WITH PROPOFOL N/A 11/21/2016   Dr. Oneida Alar: non-thrombosed external hemorrhoids, one 6 mm polyp (polypoid lesion), internal hemorrhoids. TI Normal. 10 years screening  . ESOPHAGOGASTRODUODENOSCOPY  05/03/2011   ZSW:FUXN gastritis  . NECK MASS EXCISION Right    "done in dr's office; behind right ear/side of ncek"  . POLYPECTOMY  11/21/2016   Procedure: POLYPECTOMY;  Surgeon: [name], MD;  Location: [facility] ENDO SUITE;  Service: Endoscopy;;  descending colon polyp  . SHOULDER ARTHROSCOPY W/ ROTATOR CUFF REPAIR Right 2006   acromioclavicular joint arthrosis    Family Psychiatric History: see below  Family History:  Family History  Problem Relation Age of Onset  . Lung cancer Mother   . Alcohol abuse Mother   . Heart attack Father 36  . Diabetes Father   . Alcohol abuse Father   . Hypertension Brother   . Hypertension Brother   . Anxiety disorder Sister   . Depression Sister   . Anxiety disorder Sister   . Heart attack Brother 61  . Diabetes Brother   . Hypertension Brother   . Seizures Brother   . Dementia Paternal Uncle   . Dementia Cousin   . ADD / ADHD Daughter   . Colon cancer Neg Hx   . Drug abuse Neg Hx   . Bipolar disorder Neg Hx   . OCD Neg Hx   . Paranoid behavior Neg Hx   . Schizophrenia Neg Hx   . Sexual abuse Neg Hx   . Physical abuse Neg Hx     Social History:  Social History   Socioeconomic History  . Marital status: Married    Spouse name: Not on file  . Number of children: Not on file  . Years of education: Not on file  . Highest education level: Not on file  Occupational History  . Occupation: full time    Employer: UNEMPLOYED  Tobacco Use  . Smoking status: Current Every Day Smoker    Packs/day: 0.25    Years: 25.00    Pack years: 6.25    Types: Cigarettes    Start date: 07/17/1982  . Smokeless tobacco: Never Used  Vaping Use  . Vaping Use: Never used  Substance and Sexual Activity  .  Alcohol use: No    Alcohol/week: 0.0 standard drinks  . Drug use: No  . Sexual activity: Yes    Birth control/protection: None  Other Topics Concern  . Not on file  Social History Narrative   Pt lives in [place] with wife.  5 children.  Unemployed due to panic attacks and back pain   Social Determinants of Health   Financial Resource Strain: Not on file  Food Insecurity: Not on file  Transportation Needs: Not on file  Physical Activity: Not on file  Stress: Not on file  Social Connections: Not on file    Allergies:  Allergies  Allergen Reactions  . Dexilant [Dexlansoprazole] Anaphylaxis  . Mushroom Ext Cmplx(Shiitake-Reishi-Mait) Anaphylaxis    Rapid heart rate.  . Penicillins Anaphylaxis    Has patient had a PCN reaction causing immediate rash, facial/tongue/throat swelling, SOB or lightheadedness with hypotension: Yes Has patient had a PCN reaction causing severe rash involving mucus membranes or skin necrosis: No Has patient had a PCN reaction that required hospitalization Yes Has patient had a PCN reaction occurring within the last 10 years: No If all of the above answers are "NO", then may proceed with Cephalosporin use.   [name] Doxycycline Other (See Comments)    Intolerance     Metabolic Disorder Labs: Lab Results  Component Value Date   HGBA1C 6.6 (H) 08/10/2020   MPG 142.72 08/10/2020   MPG 139.85 03/31/2020   No results found for: PROLACTIN Lab Results  Component Value Date   CHOL 109 08/10/2020   TRIG 101 08/10/2020   HDL 31 (L) 08/10/2020   CHOLHDL 3.5 08/10/2020   VLDL 20 08/10/2020   LDLCALC 58 08/10/2020   LDLCALC 75 03/31/2020   Lab Results  Component Value Date   TSH 0.958 04/21/2011   TSH 1.402 05/27/2009    Therapeutic Level Labs: No results found for: LITHIUM No results found for: VALPROATE No components found for:  CBMZ  Current Medications: Current Outpatient Medications  Medication Sig Dispense Refill  . ALPRAZolam (XANAX)  0.5 MG tablet Take one tablet by mouth three times a day and two tablets at night 150 tablet 2  . aluminum-magnesium hydroxide 200-200 MG/5ML suspension Take by mouth every 6 (six) hours as needed for indigestion.    [name] amLODipine (NORVASC) 10 MG tablet Take 1 tablet (10 mg total) by mouth daily. 90 tablet 3  . budesonide-formoterol (SYMBICORT) 80-4.5 MCG/ACT inhaler Inhale 2 puffs into the lungs 2 (two) times daily as needed.     . calcium carbonate (TUMS - DOSED IN MG ELEMENTAL CALCIUM) 500 MG chewable tablet Chew 1 tablet by mouth daily as needed for indigestion or heartburn.    . cetirizine (ZYRTEC ALLERGY) 10 MG tablet Take 1 tablet (10 mg total) by mouth daily. (Patient not taking: Reported on 09/01/2020) 30 tablet 0  . clindamycin (CLEOCIN) 300 MG capsule Take 1 capsule (300 mg total) by mouth 3 (three) times daily. 30 capsule 0  . cyclobenzaprine (FLEXERIL) 10 MG tablet Take 1 tablet (10 mg total) by mouth 3 (three) times daily. (Patient taking differently: Take 10 mg by mouth as needed.) 21 tablet 0  . esomeprazole (NEXIUM) 40 MG capsule Take 1 capsule (40 mg total) by mouth 2 (two) times daily before a meal. 60 capsule 5  . HYDROcodone-acetaminophen (NORCO/VICODIN) 5-325 MG tablet Take one to two tablets every 4-6 hours as needed for pain 40 tablet 0  . JANUVIA 100 MG tablet TAKE 1 Tablet BY MOUTH ONCE DAILY 90 tablet 1  . meclizine (ANTIVERT) 25 MG tablet Take 1 tablet (25 mg total) by mouth 3 (three) times daily as needed for dizziness. 30 tablet 0  . metFORMIN (GLUCOPHAGE) 500 MG tablet Take 500 mg by mouth 2 (two) times daily with a meal.    . metoprolol succinate (TOPROL-XL) 50 MG 24 hr tablet Take 1 and 1/2 tabs by mouth  with or immediately following a meal. 135 tablet 3  . nitroGLYCERIN (NITROSTAT) 0.4 MG SL tablet Place  1 tablet (0.4 mg total) under the tongue every 5 (five) minutes x 3 doses as needed for chest pain (if no relief after 3rd dose, proceed to the ED for an evaluation).  (Patient not taking: Reported on 09/01/2020) 25 tablet 3  . PROVENTIL HFA 108 (90 Base) MCG/ACT inhaler INHALE 2 PUFFS BY MOUTH EVERY 6 HOURS AS NEEDED FOR COUGHING, WHEEZING, OR SHORTNESS OF BREATH 13.4 g 1  . simvastatin (ZOCOR) 20 MG tablet TAKE 1 TABLET BY MOUTH AT BEDTIME 30 tablet 6  . XARELTO 20 MG TABS tablet Take 1 tablet (20 mg total) by mouth daily with supper. 90 tablet 3   No current facility-administered medications for this visit.     Musculoskeletal: Strength & Muscle Tone: within normal limits Gait & Station: normal Patient leans: N/A  Psychiatric Specialty Exam: Review of Systems  Cardiovascular: Positive for palpitations.  Musculoskeletal: Positive for arthralgias and myalgias.  All other systems reviewed and are negative.   There were no vitals taken for this visit.There is no height or weight on file to calculate BMI.  General Appearance: NA  Eye Contact:  NA  Speech:  Clear and Coherent  Volume:  Normal  Mood:  Anxious and Euthymic  Affect:  NA  Thought Process:  Goal Directed  Orientation:  Full (Time, Place, and Person)  Thought Content: WDL   Suicidal Thoughts:  No  Homicidal Thoughts:  No  Memory:  Immediate;   Good Recent;   Good Remote;   Fair  Judgement:  Good  Insight:  Fair  Psychomotor Activity:  Decreased  Concentration:  Concentration: Good and Attention Span: Good  Recall:  Good  Fund of Knowledge: Good  Language: Good  Akathisia:  No  Handed:  Right  AIMS (if indicated): not done  Assets:  Communication Skills Desire for Improvement Resilience Social Support Talents/Skills  ADL's:  Intact  Cognition: WNL  Sleep:  fair   Screenings: PHQ2-9   Flowsheet Row Patient Outreach Telephone from 08/30/2015 in [place] Patient Outreach Telephone from 08/26/2015 in [place]  PHQ-2 Total Score 2 4  PHQ-9 Total Score 8 8       Assessment and Plan: This patient is a 55 year old male with a history of  anxiety and panic attacks.  For the most part he continues to do well.  He will continue Xanax 0.5 mg 3 times daily and 1 mg at bedtime for anxiety and sleep.  He will return to see me in 3 months   [name], MD 09/13/2020, 10:12 AM

## 2020-09-15 ENCOUNTER — Telehealth: Payer: Self-pay | Admitting: Physician Assistant

## 2020-09-15 NOTE — Telephone Encounter (Signed)
Pt's wife called stating pt was Having heartburn last night (to receptionist).  Provider, myself, called pt back.  Pt says he "Felt like he was breathing fire".  He says his symptoms Started 6:30 or 7pm last evening.  He Used malox twice.  He is on nexium 40mg  bid.  He says his symtpoms It didn't ease up until 3am.   He says he would say he had sob but He was having some more wheezing last night than usual.   He has had a week worth of the clindamycin (prescribed for his tooth problem; he is pcn allergic).   Pt is counseled at length about heartburn and CP.  Discussed that he is taking a lot of GERD medications.  If he has re-occurrence of his heartburn, he can self treat with malox and the NTG that he has.  If his pain doesn't go away, he should go to ER to be evaluated for cardiac etiology of his symptoms.   Pt to stop the clindamycin because he has already had a week of it. Pt is in agreement with plans and states understanding to go to ER if heartburn persists after treatment with malox and NTG.

## 2020-09-24 ENCOUNTER — Telehealth: Payer: Self-pay | Admitting: Internal Medicine

## 2020-09-24 MED ORDER — SUCRALFATE 1 GM/10ML PO SUSP: 1.0000 g | Freq: Four times a day (QID) | ORAL | 1 refills | Status: DC

## 2020-09-24 NOTE — Telephone Encounter (Signed)
Pt called and advised he is having a lot more trouble with his indigestion since taking a Rx (clindamycin) for a tooth. States the capsule broke in this throat and now his indigestion is worse and his throat feels like it is on fire. Please advise.

## 2020-09-24 NOTE — Telephone Encounter (Signed)
Patient has questions for the nurse about his medications. Please advise [phone]

## 2020-09-24 NOTE — Telephone Encounter (Signed)
Phoned the pt and advised of Rx being sent to [facility] and instructions for current medication with new Rx. The pt agreed.

## 2020-09-24 NOTE — Telephone Encounter (Signed)
Could be dealing with pill-induced esophagitis. Continue Nexium BID.  I am sending in carafate to take 4 times a day for next 2 weeks. I sent to [facility]. Thanks!

## 2020-09-30 ENCOUNTER — Emergency Department (HOSPITAL_COMMUNITY): Payer: Self-pay

## 2020-09-30 ENCOUNTER — Telehealth: Payer: Self-pay

## 2020-09-30 ENCOUNTER — Encounter (HOSPITAL_COMMUNITY): Payer: Self-pay

## 2020-09-30 ENCOUNTER — Other Ambulatory Visit: Payer: Self-pay

## 2020-09-30 ENCOUNTER — Observation Stay (HOSPITAL_COMMUNITY)
Admission: EM | Admit: 2020-09-30 | Discharge: 2020-10-01 | Disposition: A | Discharge status: Home / Self Care | Payer: Self-pay | Attending: Family Medicine | Admitting: Family Medicine

## 2020-09-30 DIAGNOSIS — Z7984 Long term (current) use of oral hypoglycemic drugs: Secondary | ICD-10-CM | POA: Insufficient documentation

## 2020-09-30 DIAGNOSIS — K219 Gastro-esophageal reflux disease without esophagitis: Secondary | ICD-10-CM | POA: Diagnosis present

## 2020-09-30 DIAGNOSIS — Z7901 Long term (current) use of anticoagulants: Secondary | ICD-10-CM | POA: Insufficient documentation

## 2020-09-30 DIAGNOSIS — F419 Anxiety disorder, unspecified: Secondary | ICD-10-CM | POA: Diagnosis present

## 2020-09-30 DIAGNOSIS — E118 Type 2 diabetes mellitus with unspecified complications: Secondary | ICD-10-CM | POA: Diagnosis present

## 2020-09-30 DIAGNOSIS — J449 Chronic obstructive pulmonary disease, unspecified: Secondary | ICD-10-CM | POA: Insufficient documentation

## 2020-09-30 DIAGNOSIS — E782 Mixed hyperlipidemia: Secondary | ICD-10-CM | POA: Diagnosis present

## 2020-09-30 DIAGNOSIS — I480 Paroxysmal atrial fibrillation: Secondary | ICD-10-CM | POA: Diagnosis present

## 2020-09-30 DIAGNOSIS — F17210 Nicotine dependence, cigarettes, uncomplicated: Secondary | ICD-10-CM | POA: Diagnosis present

## 2020-09-30 DIAGNOSIS — I2583 Coronary atherosclerosis due to lipid rich plaque: Secondary | ICD-10-CM | POA: Diagnosis present

## 2020-09-30 DIAGNOSIS — I2510 Atherosclerotic heart disease of native coronary artery without angina pectoris: Secondary | ICD-10-CM | POA: Diagnosis present

## 2020-09-30 DIAGNOSIS — I10 Essential (primary) hypertension: Secondary | ICD-10-CM | POA: Diagnosis present

## 2020-09-30 DIAGNOSIS — G4489 Other headache syndrome: Secondary | ICD-10-CM

## 2020-09-30 DIAGNOSIS — E785 Hyperlipidemia, unspecified: Secondary | ICD-10-CM | POA: Diagnosis present

## 2020-09-30 DIAGNOSIS — Z79899 Other long term (current) drug therapy: Secondary | ICD-10-CM | POA: Insufficient documentation

## 2020-09-30 DIAGNOSIS — G459 Transient cerebral ischemic attack, unspecified: Principal | ICD-10-CM | POA: Diagnosis present

## 2020-09-30 DIAGNOSIS — H547 Unspecified visual loss: Secondary | ICD-10-CM

## 2020-09-30 DIAGNOSIS — E119 Type 2 diabetes mellitus without complications: Secondary | ICD-10-CM | POA: Insufficient documentation

## 2020-09-30 DIAGNOSIS — Z20822 Contact with and (suspected) exposure to covid-19: Secondary | ICD-10-CM | POA: Insufficient documentation

## 2020-09-30 DIAGNOSIS — J45909 Unspecified asthma, uncomplicated: Secondary | ICD-10-CM | POA: Insufficient documentation

## 2020-09-30 LAB — COMPREHENSIVE METABOLIC PANEL
ALT: 18 U/L (ref 0–44)
AST: 14 U/L — ABNORMAL LOW (ref 15–41)
Albumin: 3.8 g/dL (ref 3.5–5.0)
Alkaline Phosphatase: 49 U/L (ref 38–126)
Anion gap: 9 (ref 5–15)
BUN: 15 mg/dL (ref 6–20)
CO2: 24 mmol/L (ref 22–32)
Calcium: 8.8 mg/dL — ABNORMAL LOW (ref 8.9–10.3)
Chloride: 101 mmol/L (ref 98–111)
Creatinine, Ser: 0.98 mg/dL (ref 0.61–1.24)
GFR, Estimated: >60 mL/min (ref >60)
Glucose, Bld: 138 mg/dL — ABNORMAL HIGH (ref 70–99)
Potassium: 3.6 mmol/L (ref 3.5–5.1)
Sodium: 134 mmol/L — ABNORMAL LOW (ref 135–145)
Total Bilirubin: 0.4 mg/dL (ref 0.3–1.2)
Total Protein: 6.8 g/dL (ref 6.5–8.1)

## 2020-09-30 LAB — RAPID URINE DRUG SCREEN, HOSP PERFORMED
Amphetamines: NOT DETECTED
Barbiturates: NOT DETECTED
Benzodiazepines: NOT DETECTED
Cocaine: NOT DETECTED
Opiates: NOT DETECTED
Tetrahydrocannabinol: NOT DETECTED

## 2020-09-30 LAB — URINALYSIS, ROUTINE W REFLEX MICROSCOPIC
Bacteria, UA: NONE SEEN
Bilirubin Urine: NEGATIVE
Glucose, UA: NEGATIVE mg/dL
Ketones, ur: NEGATIVE mg/dL
Leukocytes,Ua: NEGATIVE
Nitrite: NEGATIVE
Protein, ur: NEGATIVE mg/dL
Specific Gravity, Urine: 1.015 (ref 1.005–1.030)
pH: 7 (ref 5.0–8.0)

## 2020-09-30 LAB — CBC
HCT: 45.5 % (ref 39.0–52.0)
Hemoglobin: 15.7 g/dL (ref 13.0–17.0)
MCH: 31.5 pg (ref 26.0–34.0)
MCHC: 34.5 g/dL (ref 30.0–36.0)
MCV: 91.4 fL (ref 80.0–100.0)
Platelets: 183 10*3/uL (ref 150–400)
RBC: 4.98 MIL/uL (ref 4.22–5.81)
RDW: 12.3 % (ref 11.5–15.5)
WBC: 6.8 10*3/uL (ref 4.0–10.5)
nRBC: 0 % (ref 0.0–0.2)

## 2020-09-30 LAB — RESP PANEL BY RT-PCR (FLU A&B, COVID) ARPGX2
Influenza A by PCR: NEGATIVE
Influenza B by PCR: NEGATIVE
SARS Coronavirus 2 by RT PCR: NEGATIVE

## 2020-09-30 LAB — DIFFERENTIAL
Abs Immature Granulocytes: 0.01 10*3/uL (ref 0.00–0.07)
Basophils Absolute: 0 10*3/uL (ref 0.0–0.1)
Basophils Relative: 0 %
Eosinophils Absolute: 0.1 10*3/uL (ref 0.0–0.5)
Eosinophils Relative: 2 %
Immature Granulocytes: 0 %
Lymphocytes Relative: 19 %
Lymphs Abs: 1.3 10*3/uL (ref 0.7–4.0)
Monocytes Absolute: 0.5 10*3/uL (ref 0.1–1.0)
Monocytes Relative: 7 %
Neutro Abs: 4.9 10*3/uL (ref 1.7–7.7)
Neutrophils Relative %: 72 %

## 2020-09-30 LAB — I-STAT CHEM 8, ED
BUN: 15 mg/dL (ref 6–20)
Calcium, Ion: 1.16 mmol/L (ref 1.15–1.40)
Chloride: 98 mmol/L (ref 98–111)
Creatinine, Ser: 0.9 mg/dL (ref 0.61–1.24)
Glucose, Bld: 132 mg/dL — ABNORMAL HIGH (ref 70–99)
HCT: 45 % (ref 39.0–52.0)
Hemoglobin: 15.3 g/dL (ref 13.0–17.0)
Potassium: 3.7 mmol/L (ref 3.5–5.1)
Sodium: 138 mmol/L (ref 135–145)
TCO2: 23 mmol/L (ref 22–32)

## 2020-09-30 LAB — PROTIME-INR
INR: 1.2 (ref 0.8–1.2)
Prothrombin Time: 14.6 seconds (ref 11.4–15.2)

## 2020-09-30 LAB — APTT: aPTT: 33 seconds (ref 24–36)

## 2020-09-30 LAB — ETHANOL: Alcohol, Ethyl (B): <10 mg/dL (ref <10)

## 2020-09-30 MED ORDER — ONDANSETRON HCL 4 MG/2ML IJ SOLN: 4.0000 mg | Freq: Four times a day (QID) | INTRAMUSCULAR | Status: DC | PRN

## 2020-09-30 MED ORDER — SIMVASTATIN 20 MG PO TABS
20.0000 mg | ORAL_TABLET | Freq: Every day | ORAL | Status: DC
  Administered 2020-09-30: 20 mg via ORAL
  Filled 2020-09-30: qty 2

## 2020-09-30 MED ORDER — NITROGLYCERIN 0.4 MG SL SUBL: 0.4000 mg | SUBLINGUAL_TABLET | SUBLINGUAL | Status: DC | PRN

## 2020-09-30 MED ORDER — LINAGLIPTIN 5 MG PO TABS
5.0000 mg | ORAL_TABLET | Freq: Every day | ORAL | Status: DC
  Administered 2020-10-01: 5 mg via ORAL
  Filled 2020-09-30: qty 1

## 2020-09-30 MED ORDER — STROKE: EARLY STAGES OF RECOVERY BOOK
Freq: Once | Status: AC
  Filled 2020-09-30: qty 1

## 2020-09-30 MED ORDER — ONDANSETRON HCL 4 MG PO TABS: 4.0000 mg | ORAL_TABLET | Freq: Four times a day (QID) | ORAL | Status: DC | PRN

## 2020-09-30 MED ORDER — RIVAROXABAN 20 MG PO TABS
20.0000 mg | ORAL_TABLET | Freq: Every day | ORAL | Status: DC
Start: 2020-09-30 — End: 2020-10-01
  Administered 2020-09-30 – 2020-10-01 (×2): 20 mg via ORAL
  Filled 2020-09-30 (×2): qty 1

## 2020-09-30 MED ORDER — METFORMIN HCL 500 MG PO TABS: 500.0000 mg | ORAL_TABLET | Freq: Two times a day (BID) | ORAL | Status: DC

## 2020-09-30 MED ORDER — METOPROLOL SUCCINATE ER 50 MG PO TB24: 75.0000 mg | ORAL_TABLET | Freq: Every day | ORAL | Status: DC

## 2020-09-30 MED ORDER — PANTOPRAZOLE SODIUM 40 MG PO TBEC
40.0000 mg | DELAYED_RELEASE_TABLET | Freq: Every day | ORAL | Status: DC
  Administered 2020-10-01: 40 mg via ORAL
  Filled 2020-09-30: qty 1

## 2020-09-30 MED ORDER — ACETAMINOPHEN 650 MG RE SUPP: 650.0000 mg | Freq: Four times a day (QID) | RECTAL | Status: DC | PRN

## 2020-09-30 MED ORDER — ALPRAZOLAM 0.5 MG PO TABS: 0.5000 mg | ORAL_TABLET | Freq: Three times a day (TID) | ORAL | Status: DC | PRN

## 2020-09-30 MED ORDER — MECLIZINE HCL 12.5 MG PO TABS: 25.0000 mg | ORAL_TABLET | Freq: Three times a day (TID) | ORAL | Status: DC | PRN

## 2020-09-30 MED ORDER — RIVAROXABAN 20 MG PO TABS: 20.0000 mg | ORAL_TABLET | Freq: Every day | ORAL | Status: DC

## 2020-09-30 MED ORDER — ACETAMINOPHEN 325 MG PO TABS
650.0000 mg | ORAL_TABLET | Freq: Four times a day (QID) | ORAL | Status: DC | PRN
  Administered 2020-10-01: 650 mg via ORAL
  Filled 2020-09-30: qty 2

## 2020-09-30 MED ORDER — IOHEXOL 350 MG/ML SOLN
100.0000 mL | Freq: Once | INTRAVENOUS | Status: AC | PRN
  Administered 2020-09-30: 100 mL via INTRAVENOUS

## 2020-09-30 MED ORDER — SUCRALFATE 1 GM/10ML PO SUSP
1.0000 g | Freq: Four times a day (QID) | ORAL | Status: DC
  Administered 2020-09-30 – 2020-10-01 (×3): 1 g via ORAL
  Filled 2020-09-30 (×3): qty 10

## 2020-09-30 MED ORDER — AMLODIPINE BESYLATE 5 MG PO TABS
10.0000 mg | ORAL_TABLET | Freq: Every day | ORAL | Status: DC
  Administered 2020-10-01: 10 mg via ORAL
  Filled 2020-09-30: qty 2

## 2020-09-30 NOTE — Telephone Encounter (Signed)
Richard [name],  This pt's wife called regarding her husband taking the Carafate 1gm. 1. Pt just got the Rx Monday because they had it transferred to another pharmacy where it was cheaper. 2. Pt hasn't taken any yet. 3. They want to know can he take it less than 4 times a day, for a lesser amount of time (states it will work best for him because of his eating habits) 4. Pt is having bad reflux, burning, pain still. States that doubling up on his Nexium seems to be helping.   With all that being said, "they want less of dosage and the time frame (not 2 weeks) to be changed. Please advise.

## 2020-09-30 NOTE — ED Triage Notes (Signed)
Pt here pov from home with wife with cc of headache around  7pm when he bent over. Said that when he stood back up he said his right eye was blurry but isn't anymore. Said his bp couldn't read at home. 149/72 in triage.

## 2020-09-30 NOTE — ED Notes (Signed)
Pt is stable and a/w med/tele bed upstairs- ED holding pt at this time- pt and family made aware.

## 2020-09-30 NOTE — Telephone Encounter (Signed)
Phoned and advised the pt of instructions from [place].

## 2020-09-30 NOTE — Consult Note (Addendum)
[facility] TeleNeurology Consult Services  Stat Consult  Date of Service:   09/30/2020 20:55:58  Diagnosis:     .  R51.9 - Headache, unspecified     .  G45.9 - Transient cerebral ischemic attack, unspecified     .  H53.1 - Subjective visual disturbances     .  H53.4 - Visual field defects  Impression: 55yo man with history of prior stroke/TIA 5 years ago with left sided weakness, CAD, lumbar spondylosis, COPD, DM, HTN, HLD, afib on xarelto presents with an episode of right sided vision loss followed by headache. Not a tPA candidate as patient is on a DOAC with last dose within 48 hours- his symptoms have resolved other than perhaps some mild difficulty walking. 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 Given he has no history of migraines dangerous headache etiologies including CVST, PRES need to be ruled out. Posterior circulation stroke is also possible. Recommend admission for further work up with MRI brain w/o, MR venogram head w/o, check 2D echo. May continue xarelto since his symptoms are essentially resolved. BP can be normotensive but avoid hypotension.  CT HEAD: Reviewed  Our recommendations are outlined below.  Diagnostic Studies: Recommend MRI brain without contrast Transthoracic Echo with bubble study, if available  Laboratory Studies: Recommend Lipid panel Hemoglobin A1c TSH  Medication: Statins for LDL goal less than 70  Nursing Recommendations: Telemetry, IV Fluids, avoid dextrose containing fluids, Maintain euglycemia Neuro checks q4 hrs x 24 hrs and then per shift Head of bed 30 degrees  Consultations: Recommend Speech therapy if failed dysphagia screen Physical therapy/Occupational therapy  DVT Prophylaxis: SCDs, Pneumatic Compression  Disposition: Neurology will follow  Metrics: [employer] Notification Time: 09/30/2020 20:54:26 Stamp Time: 09/30/2020 20:55:58 Callback  Response Time: 09/30/2020 20:59:53  ----------------------------------------------------------------------------------------------------  Chief Complaint: headache, vision loss  History of Present Illness: Patient is a 55 year old Male.  55yo man with history of prior stroke/TIA 5 years ago with left sided weakness, CAD, lumbar spondylosis, COPD, DM, HTN, HLD, afib on xarelto presents with an episode of right sided vision loss followed by headache. His symptoms have presently resolved. At 630pm this evening he had sudden onset of right sided vision loss and was seeing flickering lights in his right eye visual fields and noted it was like his vision was cut in half down the middle. This improved to seeing black spots before resolving. He had onset of headache some minutes later as his vision was improving. The headache was unusual for him, severe top of the head in location. He had some difficulty with balance as well and per ED physician still present in the ED. He does report a small area of tingling in the distal LLE near his ankle. The headache and vision changes have resolved at this point. His last dose of xarelto was at 11pm or so last night. No pain with chewing over the past few days.       Examination: BP(149/72), Pulse(63), Blood Glucose(132) 1A: Level of Consciousness - Alert; keenly responsive + 0 1B: Ask Month and Age - Both Questions Right + 0 1C: Blink Eyes & Squeeze Hands - Performs Both Tasks + 0 2: Test Horizontal Extraocular Movements - Normal + 0 3: Test Visual Fields - No Visual Loss + 0 4: Test Facial Palsy (Use Grimace if Obtunded) - Normal symmetry + 0 5A: Test Left Arm Motor Drift - No Drift for 10 Seconds + 0 5B: Test Right Arm  Motor Drift - No Drift for 10 Seconds + 0 6A: Test Left Leg Motor Drift - No Drift for 5 Seconds + 0 6B: Test Right Leg Motor Drift - No Drift for 5 Seconds + 0 7: Test Limb Ataxia (FNF/Heel-Shin) - No Ataxia + 0 8: Test Sensation - Normal;  No sensory loss + 0 9: Test Language/Aphasia - Normal; No aphasia + 0 10: Test Dysarthria - Normal + 0 11: Test Extinction/Inattention - No abnormality + 0  NIHSS Score: 0    Patient / Family was informed the Neurology Consult would occur via TeleHealth consult by way of interactive audio and video telecommunications and consented to receiving care in this manner.  Patient is being evaluated for possible acute neurologic impairment and high probability of imminent or life - threatening deterioration.I spent total of 40 minutes providing care to this patient, including time for face to face visit via telemedicine, review of medical records, imaging studies and discussion of findings with providers, the patient and / or family.   Dr Annamary Carolin   [facility] [phone]  Case [id number]  Addendum No LVO or significant stenosis per radiologist

## 2020-09-30 NOTE — ED Provider Notes (Signed)
[facility] EMERGENCY DEPARTMENT Provider Note   CSN: [id number] Arrival date & time: 09/30/20  2024     History Chief Complaint  Patient presents with  . Headache    Richard [name] is a 55 y.o. male.  HPI   This patient is a 55 year old male, he has a history of COPD, history of hypertension, coronary disease without need for intervention, paroxysmal atrial fibrillation on Xarelto and a prior history of stroke that left him with left facial weakness and visual changes.  He reports that he had the stroke a couple of years ago but returned back to his baseline.  Tonight while he was in his usual state of health he got up to walk the dog, he came back into the house and while he was sitting down on the couch he felt acute change in the vision of his right eye.  This was his right temporal vision which she states was gone, he felt like there was black dots in his vision, he still had the nasal aspect of that eye, he does not think there was any symptoms in the left eye.  Shortly thereafter he started to develop a mild headache and felt like he was ataxic when he was walking needing to hold onto things.  There is no vertigo, he had no focal weakness but felt like his entire body was weak.  All of the symptoms have gradually improved and now he is left with a residual headache.  He states his vision is back to normal, he took no specific medications for this but came with his wife to the hospital.  Past Medical History:  Diagnosis Date  . Arthritis    "legs, spine, shoulders" (08/12/2015)  . Asthma   . Colitis 1990  . COPD (chronic obstructive pulmonary disease) ([place])   . Depression   . Gastric ulcer 2003; 2012   2003: + esophagitis; negative H.pylori serology  2012: Dr. Oneida Alar, mild gastritis, Bravo PH probe placement, negative H.pylori  . GERD (gastroesophageal reflux disease)   . Hepatic steatosis   . History of hiatal hernia   . Hyperlipemia   . Hypertension   . Mild CAD    a.  Cardiac cath 07/2015 showed 65% distal Cx, 20% mid-distal LAD, 20% prox-distal RCA, EF 60%, EDP 3mmHg.  [name] Overweight   . Panic attacks   . Paroxysmal atrial fibrillation (HCC)   . Stroke [facility])    mucsle in left side of face dosent work as its supposed to-only deficit.  [name] TIA (transient ischemic attack)    after cath 1/ 17  . Tobacco abuse    1/2 pack per day  . Type II diabetes mellitus [facility])     Patient Active Problem List   Diagnosis Date Noted  . Paroxysmal atrial fibrillation (HCC)   . Lateral epicondylitis, left elbow 03/27/2019  . Globus sensation 02/21/2018  . Other cervical disc degeneration, unspecified cervical region 01/11/2018  . Tendinitis of right triceps 01/11/2018  . Neck pain 12/27/2017  . Lateral epicondylitis, right elbow 05/10/2017  . Constipation 12/24/2015  . Dyspnea 11/17/2015  . Coronary artery disease due to lipid rich plaque   . Heart palpitations 08/12/2015  . TIA (transient ischemic attack) 08/12/2015  . Colon cancer screening 08/02/2015  . Abdominal pain 12/18/2014  . Encounter for screening colonoscopy 12/18/2014  . Unspecified vitamin D deficiency 08/20/2012  . Arteriosclerotic cardiovascular disease (ASCVD) 04/11/2012  . Chronic low back pain   . Essential hypertension   .  Anxiety and depression   . Controlled diabetes mellitus type 2 with complications ([place]) 27/25/3664  . Cigarette smoker 09/14/2010  . CHRONIC OBSTRUCTIVE PULMONARY DISEASE 09/14/2010  . HLD (hyperlipidemia) 11/25/2009  . GERD (gastroesophageal reflux disease) 04/05/2009  . Hepatic steatosis 04/05/2009    Past Surgical History:  Procedure Laterality Date  . BRAVO [place] STUDY  05/03/2011   QIH:KVQQ gastritis/normal esophagus and duodenum  . CARDIAC CATHETERIZATION  1990s X 1; 2005; 08/12/2015  . CARDIAC CATHETERIZATION N/A 08/12/2015   Procedure: Left Heart Cath and Coronary Angiography;  Surgeon: Belva Crome, MD; LAD 20%, CFX 65%, RCA 20%, EF 60%   . COLONOSCOPY  1990  .  COLONOSCOPY WITH PROPOFOL N/A 11/21/2016   Dr. Oneida Alar: non-thrombosed external hemorrhoids, one 6 mm polyp (polypoid lesion), internal hemorrhoids. TI Normal. 10 years screening  . ESOPHAGOGASTRODUODENOSCOPY  05/03/2011   VZD:GLOV gastritis  . NECK MASS EXCISION Right    "done in dr's office; behind right ear/side of ncek"  . POLYPECTOMY  11/21/2016   Procedure: POLYPECTOMY;  Surgeon: [name], MD;  Location: [facility];  Service: Endoscopy;;  descending colon polyp  . SHOULDER ARTHROSCOPY W/ ROTATOR CUFF REPAIR Right 2006   acromioclavicular joint arthrosis       Family History  Problem Relation Age of Onset  . Lung cancer Mother   . Alcohol abuse Mother   . Heart attack Father 31  . Diabetes Father   . Alcohol abuse Father   . Hypertension Brother   . Hypertension Brother   . Anxiety disorder Sister   . Depression Sister   . Anxiety disorder Sister   . Heart attack Brother 61  . Diabetes Brother   . Hypertension Brother   . Seizures Brother   . Dementia Paternal Uncle   . Dementia Cousin   . ADD / ADHD Daughter   . Colon cancer Neg Hx   . Drug abuse Neg Hx   . Bipolar disorder Neg Hx   . OCD Neg Hx   . Paranoid behavior Neg Hx   . Schizophrenia Neg Hx   . Sexual abuse Neg Hx   . Physical abuse Neg Hx     Social History   Tobacco Use  . Smoking status: Current Every Day Smoker    Packs/day: 0.25    Years: 25.00    Pack years: 6.25    Types: Cigarettes    Start date: 07/17/1982  . Smokeless tobacco: Never Used  Vaping Use  . Vaping Use: Never used  Substance Use Topics  . Alcohol use: No    Alcohol/week: 0.0 standard drinks  . Drug use: No    Home Medications Prior to Admission medications   Medication Sig Start Date End Date Taking? Authorizing Provider  ALPRAZolam Duanne Moron) 0.5 MG tablet Take one tablet by mouth three times a day and two tablets at night 09/13/20  Yes Cloria Spring, MD  aluminum-magnesium hydroxide 200-200 MG/5ML suspension Take  by mouth every 6 (six) hours as needed for indigestion.   Yes [provider]  amLODipine (NORVASC) 10 MG tablet Take 1 tablet (10 mg total) by mouth daily. 02/02/20  Yes Allred, Jeneen Rinks, MD  budesonide-formoterol (SYMBICORT) 80-4.5 MCG/ACT inhaler Inhale 2 puffs into the lungs 2 (two) times daily as needed.    Yes [provider]  calcium carbonate (TUMS - DOSED IN MG ELEMENTAL CALCIUM) 500 MG chewable tablet Chew 1 tablet by mouth daily as needed for indigestion or heartburn.   Yes [provider]  esomeprazole (NEXIUM) 40 MG capsule Take 1 capsule (40 mg total) by mouth 2 (two) times daily before a meal. 06/03/20  Yes Annitta Needs, NP  JANUVIA 100 MG tablet TAKE 1 Tablet BY MOUTH ONCE DAILY Patient taking differently: Take 100 mg by mouth daily. 07/27/20  Yes [name], PA-C  meclizine (ANTIVERT) 25 MG tablet Take 1 tablet (25 mg total) by mouth 3 (three) times daily as needed for dizziness. 09/01/20  Yes [name], PA-C  metFORMIN (GLUCOPHAGE) 500 MG tablet Take 500 mg by mouth 2 (two) times daily with a meal.   Yes [provider]  nitroGLYCERIN (NITROSTAT) 0.4 MG SL tablet Place 1 tablet (0.4 mg total) under the tongue every 5 (five) minutes x 3 doses as needed for chest pain (if no relief after 3rd dose, proceed to the ED for an evaluation). 02/02/20  Yes Allred, Jeneen Rinks, MD  PROVENTIL HFA 108 [phone] Base) MCG/ACT inhaler INHALE 2 PUFFS BY MOUTH EVERY 6 HOURS AS NEEDED FOR COUGHING, WHEEZING, OR SHORTNESS OF BREATH 07/27/20  Yes [name], PA-C  simvastatin (ZOCOR) 20 MG tablet TAKE 1 TABLET BY MOUTH AT BEDTIME Patient taking differently: Take 20 mg by mouth at bedtime. 08/16/20  Yes [name], PA-C  XARELTO 20 MG TABS tablet Take 1 tablet (20 mg total) by mouth daily with supper. 02/11/20  Yes Verta Ellen., NP  cyclobenzaprine (FLEXERIL) 10 MG tablet Take 1 tablet (10 mg total) by mouth as needed for muscle spasms. 10/01/20   Johnson,  Clanford L, MD  metoprolol succinate (TOPROL-XL) 50 MG 24 hr tablet Take 1 tablet (50 mg total) by mouth daily. 10/01/20   Johnson, Clanford L, MD  sucralfate (CARAFATE) 1 GM/10ML suspension Take 10 mLs (1 g total) by mouth 4 (four) times daily. 09/24/20   Annitta Needs, NP    Allergies    Dexilant [dexlansoprazole], Mushroom ext cmplx(shiitake-reishi-mait), Penicillins, and Doxycycline  Review of Systems   Review of Systems  All other systems reviewed and are negative.   Physical Exam Updated Vital Signs BP 136/78 (BP Location: Left Arm)   Pulse (!) 51 Comment: correction data  Temp 98.2 F (36.8 C) (Oral)   Resp 16   Ht 1.803 m (5\' 11" )   Wt 83.9 kg   SpO2 99%   BMI 25.80 kg/m   Physical Exam Vitals and nursing note reviewed.  Constitutional:      General: He is not in acute distress.    Appearance: He is well-developed.  HENT:     Head: Normocephalic and atraumatic.     Mouth/Throat:     Pharynx: No oropharyngeal exudate.  Eyes:     General: No scleral icterus.       Right eye: No discharge.        Left eye: No discharge.     Conjunctiva/sclera: Conjunctivae normal.     Pupils: Pupils are equal, round, and reactive to light.  Neck:     Thyroid: No thyromegaly.     Vascular: No JVD.  Cardiovascular:     Rate and Rhythm: Normal rate and regular rhythm.     Heart sounds: Normal heart sounds. No murmur heard. No friction rub. No gallop.   Pulmonary:     Effort: Pulmonary effort is normal. No respiratory distress.     Breath sounds: Normal breath sounds. No wheezing or rales.  Abdominal:     General: Bowel sounds are normal. There is no distension.  Palpations: Abdomen is soft. There is no mass.     Tenderness: There is no abdominal tenderness.  Musculoskeletal:        General: No tenderness. Normal range of motion.     Cervical back: Normal range of motion and neck supple.  Lymphadenopathy:     Cervical: No cervical adenopathy.  Skin:    General: Skin is  warm and dry.     Findings: No erythema or rash.  Neurological:     Mental Status: He is alert.     Coordination: Coordination normal.     Comments: Neurologic exam:  Speech clear, pupils equal round reactive to light, extraocular movements intact  Normal peripheral visual fields Cranial nerves III through XII normal including no facial droop Follows commands, moves all extremities x4, normal strength to bilateral upper and lower extremities at all major muscle groups including grip Sensation normal to light touch and pinprick, except for the left lower extremity below the knee where he states there is a little bit of a tingling sensation to light touch Coordination intact, no limb ataxia, finger-nose-finger normal, heel shin normal bilaterally Rapid alternating movements normal No pronator drift Gait slightly ataxic Can heal and toe walk without weakness. Peripheral vision is normal in all fields both eyes.  Psychiatric:        Behavior: Behavior normal.     ED Results / Procedures / Treatments   Labs (all labs ordered are listed, but only abnormal results are displayed) Labs Reviewed  COMPREHENSIVE METABOLIC PANEL - Abnormal; Notable for the following components:      Result Value   Sodium 134 (*)    Glucose, Bld 138 (*)    Calcium 8.8 (*)    AST 14 (*)    All other components within normal limits  URINALYSIS, ROUTINE W REFLEX MICROSCOPIC - Abnormal; Notable for the following components:   Color, Urine STRAW (*)    Hgb urine dipstick SMALL (*)    All other components within normal limits  HEMOGLOBIN A1C - Abnormal; Notable for the following components:   Hgb A1c MFr Bld 6.4 (*)    All other components within normal limits  LIPID PANEL - Abnormal; Notable for the following components:   HDL 30 (*)    All other components within normal limits  GLUCOSE, CAPILLARY - Abnormal; Notable for the following components:   Glucose-Capillary 113 (*)    All other components within  normal limits  GLUCOSE, CAPILLARY - Abnormal; Notable for the following components:   Glucose-Capillary 139 (*)    All other components within normal limits  I-STAT CHEM 8, ED - Abnormal; Notable for the following components:   Glucose, Bld 132 (*)    All other components within normal limits  RESP PANEL BY RT-PCR (FLU A&B, COVID) ARPGX2  ETHANOL  PROTIME-INR  APTT  CBC  DIFFERENTIAL  RAPID URINE DRUG SCREEN, HOSP PERFORMED  HIV ANTIBODY (ROUTINE TESTING W REFLEX)  TSH    EKG EKG Interpretation  Date/Time:  Thursday September 30 2020 20:38:39 EDT Ventricular Rate:  59 PR Interval:    QRS Duration: 109 QT Interval:  437 QTC Calculation: 433 R Axis:   82 Text Interpretation: Sinus rhythm Normal ECG Confirmed by Noemi Chapel [phone]) on 09/30/2020 9:02:11 PM   Radiology ECHOCARDIOGRAM COMPLETE BUBBLE STUDY  Result Date: 10/01/2020    ECHOCARDIOGRAM REPORT   Patient Name:   SKILER OLDEN Date of Exam: 10/01/2020 Medical Rec #:  [id number]      Height:  71.0 in Accession #:    [id number]     Weight:       185.0 lb Date of Birth:  [birth year]      BSA:          2.040 m Patient Age:    1 years       BP:           126/71 mmHg Patient Gender: M              HR:           67 bpm. Exam Location:  [facility] Procedure: 2D Echo, Color Doppler, Cardiac Doppler and Saline Contrast Bubble            Study Indications:    TIA (transient ischemic attack) 435.9 / G45.9  History:        Patient has prior history of Echocardiogram examinations, most                 recent 02/24/2020. CAD, TIA, Stroke and COPD, Arrythmias:Atrial                 Fibrillation; Risk Factors:Diabetes, Hypertension, Dyslipidemia                 and Former Smoker.  Sonographer:    Bernadene Person RDCS Referring Phys: [id number] [place]  1. Left ventricular ejection fraction, by estimation, is 65 to 70%. The left ventricle has normal function. The left ventricle has no regional wall motion abnormalities. Left  ventricular diastolic parameters were normal.  2. Right ventricular systolic function is normal. The right ventricular size is normal. Tricuspid regurgitation signal is inadequate for assessing PA pressure.  3. The mitral valve is grossly normal. No evidence of mitral valve regurgitation.  4. The aortic valve is tricuspid. Aortic valve regurgitation is not visualized.  5. The inferior vena cava is normal in size with <50% respiratory variability, suggesting right atrial pressure of 8 mmHg.  6. Agitated saline contrast bubble study was negative, with no evidence of any interatrial shunt. FINDINGS  Left Ventricle: Left ventricular ejection fraction, by estimation, is 65 to 70%. The left ventricle has normal function. The left ventricle has no regional wall motion abnormalities. The left ventricular internal cavity size was normal in size. There is  borderline left ventricular hypertrophy. Left ventricular diastolic parameters were normal. Right Ventricle: The right ventricular size is normal. No increase in right ventricular wall thickness. Right ventricular systolic function is normal. Tricuspid regurgitation signal is inadequate for assessing PA pressure. Left Atrium: Left atrial size was normal in size. Right Atrium: Right atrial size was normal in size. Pericardium: There is no evidence of pericardial effusion. Mitral Valve: The mitral valve is grossly normal. No evidence of mitral valve regurgitation. Tricuspid Valve: The tricuspid valve is grossly normal. Tricuspid valve regurgitation is trivial. Aortic Valve: The aortic valve is tricuspid. There is mild to moderate aortic valve annular calcification. Aortic valve regurgitation is not visualized. Pulmonic Valve: The pulmonic valve was grossly normal. Pulmonic valve regurgitation is trivial. Aorta: The aortic root is normal in size and structure. Venous: The inferior vena cava is normal in size with less than 50% respiratory variability, suggesting right atrial  pressure of 8 mmHg. IAS/Shunts: No atrial level shunt detected by color flow Doppler. Agitated saline contrast was given intravenously to evaluate for intracardiac shunting. Agitated saline contrast bubble study was negative, with no evidence of any interatrial shunt.  LEFT VENTRICLE PLAX 2D LVIDd:  4.40 cm  Diastology LVIDs:         2.60 cm  LV e' medial:    8.92 cm/s LV PW:         1.00 cm  LV E/e' medial:  9.9 LV IVS:        1.00 cm  LV e' lateral:   11.10 cm/s LVOT diam:     1.90 cm  LV E/e' lateral: 8.0 LV SV:         86 LV SV Index:   42 LVOT Area:     2.84 cm  RIGHT VENTRICLE RV S prime:     12.90 cm/s TAPSE (M-mode): 2.2 cm LEFT ATRIUM             Index       RIGHT ATRIUM           Index LA diam:        3.50 cm 1.72 cm/m  RA Area:     15.80 cm LA Vol (A2C):   40.3 ml 19.76 ml/m RA Volume:   41.50 ml  20.34 ml/m LA Vol (A4C):   38.5 ml 18.87 ml/m LA Biplane Vol: 39.4 ml 19.32 ml/m  AORTIC VALVE LVOT Vmax:   155.00 cm/s LVOT Vmean:  97.900 cm/s LVOT VTI:    0.302 m  AORTA Ao Root diam: 2.90 cm Ao Asc diam:  2.90 cm MITRAL VALVE MV Area (PHT): 3.21 cm    SHUNTS MV Decel Time: 236 msec    Systemic VTI:  0.30 m MV E velocity: 88.30 cm/s  Systemic Diam: 1.90 cm MV A velocity: 61.80 cm/s MV E/A ratio:  1.43 Rozann Lesches MD Electronically signed by Rozann Lesches MD Signature Date/Time: 10/01/2020/5:14:07 PM    Final     Procedures Procedures   Medications Ordered in ED Medications  iohexol (OMNIPAQUE) 350 MG/ML injection 100 mL (100 mLs Intravenous Contrast Given 09/30/20 2116)   stroke: mapping our early stages of recovery book ( Does not apply Given 10/01/20 0954)    ED Course  I have reviewed the triage vital signs and the nursing notes.  Pertinent labs & imaging results that were available during my care of the patient were reviewed by me and considered in my medical decision making (see chart for details).  Clinical Course as of 10/03/20 1001  Thu Sep 30, 2020  2101 Onset of  visual changes was 6:33 PM - lasted 1 hour - followed by weakness (generalized), and ataxia, gradually improving [BM]    Clinical Course User Index [BM] Noemi Chapel, MD   MDM Rules/Calculators/A&P                          The patient's EKG is unremarkable, he is in sinus rhythm.  His exam at this time is a little bit concerning as he still has a bit of ataxia however the rest of his exam in the supine position is normal with strength, he has a slight decrease sensation of the left lower extremity which she states has a tingling, he has no visual changes at this time, blood pressure is 149/72.  Patient has a 0 on the NIH scale but at the time that his symptoms were present he would have had a score that could have been reflective of posterior circulation issues, this could be more retinal however the patient had ataxia suggesting more of a central lesion.  CT scan, CT angiogram, neuro consultation.  D/w Neuro -  not a TPA candidate - on xarelto Needs MRI / Echo / MRV - stay on xarelto tonight Keep normotensive - avoid hypotension. Will d/w hospitalist for admission.  I have personally viewed and interpreted the results of the lab work, there is no signs of acute dysfunction that would cause the patient's symptoms, I have personally looked at the CT scan of the brain and see no signs of acute hemorrhage or mass or obvious ischemic stroke.  Discussed with Dr. Marvel Plan who is the teleneurologist on-call who made the above recommendations. (10:18 PM)  Final Clinical Impression(s) / ED Diagnoses Final diagnoses:  TIA (transient ischemic attack)  Visual loss  Other headache syndrome    Rx / DC Orders ED Discharge Orders         Ordered    cyclobenzaprine (FLEXERIL) 10 MG tablet  As needed        10/01/20 1734    metoprolol succinate (TOPROL-XL) 50 MG 24 hr tablet  Daily        10/01/20 1743           Noemi Chapel, MD 10/03/20 1002

## 2020-09-30 NOTE — Telephone Encounter (Signed)
Not sure why they want less dosing but same time frame. He could take twice a day. It's likely the Nexium BID that will help the most.

## 2020-09-30 NOTE — H&P (Signed)
History and Physical    [name] MVH:[id number] DOB: Aug 06, 1965 DOA: 09/30/2020  PCP: [name], PA-C  Patient coming from: Home.  I have personally briefly reviewed patient's old medical records in [place]  Chief Complaint: Headache and right eye partial blindness.  HPI: Richard [name] is a 55 y.o. male with medical history significant of osteoarthritis, asthma/COPD, unspecified colitis, anxiety/depression, panic attacks, PUD, GERD, hiatal hernia, hepatic asteatosis, hyperlipidemia, hypertension, mild CAD, paroxysmal atrial fibrillation, history of CVA with residual left facial deficit, TIA after CAD in January 2017, tobacco abuse (per patient, down to 2 cigarettes/day), type 2 diabetes mellitus who is coming to the emergency department after having lateral hemianopsia of the right eye followed by fronto-coronal headache associated with generalized weakness and mild ataxia.  The patient stated that he had a 45-minute period of palpitations prior to his vision changes.  He denies focal weakness or numbness, vertigo sensation, nausea, emesis, slurred speech or inability to comprehend language.  No history of fever, chills, sore throat, rhinorrhea, wheezing, dyspnea or hemoptysis.  Denies chest pain, diaphoresis, PND, orthopnea or pitting edema of the lower extremities.  Denies abdominal pain, diarrhea, constipation, melena or hematochezia.  No dysuria, frequency or hematuria.  Denies polyuria, polydipsia, polyphagia or blurred vision.  ED Course: Initial vital signs were temperature 97.7 F, pulse 63, respiration 19, BP 149/72 mmHg O2 sat 99% on room air.  Labwork: Urinalysis shows small hemoglobinuria, but was otherwise unremarkable.  UDS was negative.  Alcohol level was undetectable.  Coronavirus 2 and influenza PCR was negative.  CBC was normal.  PT/INR/PTT within normal limits.  CMP shows a glucose of 138 mg/dL, all other values are unremarkable when sodium is corrected to  glucose and calcium to albumin.  Imaging: CT head and CT a head and neck did not show any acute abnormalities.  Please see images and full radiology report for further detail.  Review of Systems: As per HPI otherwise all other systems reviewed and are negative.  Past Medical History:  Diagnosis Date  . Arthritis    "legs, spine, shoulders" (08/12/2015)  . Asthma   . Colitis 1990  . COPD (chronic obstructive pulmonary disease) ([place])   . Depression   . Gastric ulcer 2003; 2012   2003: + esophagitis; negative H.pylori serology  2012: Dr. Oneida Alar, mild gastritis, Bravo PH probe placement, negative H.pylori  . GERD (gastroesophageal reflux disease)   . Hepatic steatosis   . History of hiatal hernia   . Hyperlipemia   . Hypertension   . Mild CAD    a. Cardiac cath 07/2015 showed 65% distal Cx, 20% mid-distal LAD, 20% prox-distal RCA, EF 60%, EDP 3mmHg.  [name] Overweight   . Panic attacks   . Paroxysmal atrial fibrillation (HCC)   . Stroke [facility])    mucsle in left side of face dosent work as its supposed to-only deficit.  [name] TIA (transient ischemic attack)    after cath 1/ 17  . Tobacco abuse    1/2 pack per day  . Type II diabetes mellitus ([place])     Past Surgical History:  Procedure Laterality Date  . BRAVO [place] STUDY  05/03/2011   WUX:LKGM gastritis/normal esophagus and duodenum  . CARDIAC CATHETERIZATION  1990s X 1; 2005; 08/12/2015  . CARDIAC CATHETERIZATION N/A 08/12/2015   Procedure: Left Heart Cath and Coronary Angiography;  Surgeon: Belva Crome, MD; LAD 20%, CFX 65%, RCA 20%, EF 60%   . COLONOSCOPY  1990  . COLONOSCOPY  WITH PROPOFOL N/A 11/21/2016   Dr. Oneida Alar: non-thrombosed external hemorrhoids, one 6 mm polyp (polypoid lesion), internal hemorrhoids. TI Normal. 10 years screening  . ESOPHAGOGASTRODUODENOSCOPY  05/03/2011   NKN:LZJQ gastritis  . NECK MASS EXCISION Right    "done in dr's office; behind right ear/side of ncek"  . POLYPECTOMY  11/21/2016   Procedure: POLYPECTOMY;   Surgeon: [name], MD;  Location: [facility];  Service: Endoscopy;;  descending colon polyp  . SHOULDER ARTHROSCOPY W/ ROTATOR CUFF REPAIR Right 2006   acromioclavicular joint arthrosis    Social History  reports that he has been smoking cigarettes. He started smoking about 38 years ago. He has a 6.25 pack-year smoking history. He has never used smokeless tobacco. He reports that he does not drink alcohol and does not use drugs.  Allergies  Allergen Reactions  . Dexilant [Dexlansoprazole] Anaphylaxis  . Mushroom Ext Cmplx(Shiitake-Reishi-Mait) Anaphylaxis    Rapid heart rate.  . Penicillins Anaphylaxis    Has patient had a PCN reaction causing immediate rash, facial/tongue/throat swelling, SOB or lightheadedness with hypotension: Yes Has patient had a PCN reaction causing severe rash involving mucus membranes or skin necrosis: No Has patient had a PCN reaction that required hospitalization Yes Has patient had a PCN reaction occurring within the last 10 years: No If all of the above answers are "NO", then may proceed with Cephalosporin use.   [name] Doxycycline Nausea And Vomiting         Family History  Problem Relation Age of Onset  . Lung cancer Mother   . Alcohol abuse Mother   . Heart attack Father 16  . Diabetes Father   . Alcohol abuse Father   . Hypertension Brother   . Hypertension Brother   . Anxiety disorder Sister   . Depression Sister   . Anxiety disorder Sister   . Heart attack Brother 61  . Diabetes Brother   . Hypertension Brother   . Seizures Brother   . Dementia Paternal Uncle   . Dementia Cousin   . ADD / ADHD Daughter   . Colon cancer Neg Hx   . Drug abuse Neg Hx   . Bipolar disorder Neg Hx   . OCD Neg Hx   . Paranoid behavior Neg Hx   . Schizophrenia Neg Hx   . Sexual abuse Neg Hx   . Physical abuse Neg Hx    Prior to Admission medications   Medication Sig Start Date End Date Taking? Authorizing Provider  ALPRAZolam Duanne Moron) 0.5 MG tablet  Take one tablet by mouth three times a day and two tablets at night 09/13/20  Yes Cloria Spring, MD  aluminum-magnesium hydroxide 200-200 MG/5ML suspension Take by mouth every 6 (six) hours as needed for indigestion.   Yes [provider]  amLODipine (NORVASC) 10 MG tablet Take 1 tablet (10 mg total) by mouth daily. 02/02/20  Yes Allred, Jeneen Rinks, MD  budesonide-formoterol (SYMBICORT) 80-4.5 MCG/ACT inhaler Inhale 2 puffs into the lungs 2 (two) times daily as needed.    Yes [provider]  calcium carbonate (TUMS - DOSED IN MG ELEMENTAL CALCIUM) 500 MG chewable tablet Chew 1 tablet by mouth daily as needed for indigestion or heartburn.   Yes [provider]  cyclobenzaprine (FLEXERIL) 10 MG tablet Take 1 tablet (10 mg total) by mouth 3 (three) times daily. Patient taking differently: Take 10 mg by mouth as needed for muscle spasms. 10/07/15  Yes Lily Kocher, PA-C  esomeprazole (NEXIUM) 40 MG capsule  Take 1 capsule (40 mg total) by mouth 2 (two) times daily before a meal. 06/03/20  Yes Annitta Needs, NP  JANUVIA 100 MG tablet TAKE 1 Tablet BY MOUTH ONCE DAILY Patient taking differently: Take 100 mg by mouth daily. 07/27/20  Yes [name], PA-C  meclizine (ANTIVERT) 25 MG tablet Take 1 tablet (25 mg total) by mouth 3 (three) times daily as needed for dizziness. 09/01/20  Yes [name], PA-C  metFORMIN (GLUCOPHAGE) 500 MG tablet Take 500 mg by mouth 2 (two) times daily with a meal.   Yes [provider]  metoprolol succinate (TOPROL-XL) 50 MG 24 hr tablet Take 1 and 1/2 tabs by mouth  with or immediately following a meal. Patient taking differently: Take 75 mg by mouth daily. Take 1 and 1/2 tabs by mouth  with or immediately following a meal. 05/06/20  Yes Cleaver, Jossie Ng, NP  nitroGLYCERIN (NITROSTAT) 0.4 MG SL tablet Place 1 tablet (0.4 mg total) under the tongue every 5 (five) minutes x 3 doses as needed for chest pain (if no relief after 3rd dose,  proceed to the ED for an evaluation). 02/02/20  Yes Allred, Jeneen Rinks, MD  PROVENTIL HFA 108 [phone] Base) MCG/ACT inhaler INHALE 2 PUFFS BY MOUTH EVERY 6 HOURS AS NEEDED FOR COUGHING, WHEEZING, OR SHORTNESS OF BREATH 07/27/20  Yes [name], PA-C  simvastatin (ZOCOR) 20 MG tablet TAKE 1 TABLET BY MOUTH AT BEDTIME Patient taking differently: Take 20 mg by mouth at bedtime. 08/16/20  Yes [name], PA-C  XARELTO 20 MG TABS tablet Take 1 tablet (20 mg total) by mouth daily with supper. 02/11/20  Yes Verta Ellen., NP  cetirizine (ZYRTEC ALLERGY) 10 MG tablet Take 1 tablet (10 mg total) by mouth daily. 07/03/20   Avegno, Darrelyn Hillock, FNP  clindamycin (CLEOCIN) 300 MG capsule Take 1 capsule (300 mg total) by mouth 3 (three) times daily. 09/08/20   [name], PA-C  HYDROcodone-acetaminophen (NORCO/VICODIN) 5-325 MG tablet Take one to two tablets every 4-6 hours as needed for pain 01/04/17   Marybelle Killings, MD  sucralfate (CARAFATE) 1 GM/10ML suspension Take 10 mLs (1 g total) by mouth 4 (four) times daily. 09/24/20   Annitta Needs, NP    Physical Exam: Vitals:   09/30/20 2033 09/30/20 2100 09/30/20 2202 09/30/20 2230  BP:  (!) 146/85 125/72 131/74  Pulse:   61 61  Resp:  15 19 (!) 24  Temp:      TempSrc:      SpO2:  99% 97% 96%  Weight: 83.9 kg     Height: 5\' 11"  (1.803 m)       Constitutional: NAD, calm, comfortable Eyes: PERRL, lids and conjunctivae normal ENMT: Mucous membranes are moist. Posterior pharynx clear of any exudate or lesions. Neck: normal, supple, no masses, no thyromegaly Respiratory: clear to auscultation bilaterally, no wheezing, no crackles. Normal respiratory effort. No accessory muscle use.  Cardiovascular: Bradycardic at 56 bpm, no murmurs / rubs / gallops. No extremity edema. 2+ pedal pulses. No carotid bruits.  Abdomen: No distention.  Bowel sounds positive.  Soft, no tenderness, no masses palpated. No hepatosplenomegaly.  Musculoskeletal: no clubbing /  cyanosis.  Good ROM, no contractures. Normal muscle tone.  Skin: no acute rashes, lesions, ulcers on very limited dermatological examination. Neurologic: Mild asymmetry on the left facial area (chronic), otherwise CN 2-12 grossly intact. Sensation intact, DTR normal. Strength 5/5 in all 4.  Psychiatric: Normal judgment and insight. Alert and oriented  x 3. Normal mood.   Labs on Admission: I have personally reviewed following labs and imaging studies  CBC: Recent Labs  Lab 09/30/20 2143 09/30/20 2155  WBC 6.8  --   NEUTROABS 4.9  --   HGB 15.7 15.3  HCT 45.5 45.0  MCV 91.4  --   PLT 183  --     Basic Metabolic Panel: Recent Labs  Lab 09/30/20 2143 09/30/20 2155  NA 134* 138  K 3.6 3.7  CL 101 98  CO2 24  --   GLUCOSE 138* 132*  BUN 15 15  CREATININE 0.98 0.90  CALCIUM 8.8*  --     GFR: Estimated Creatinine Clearance: 98.8 mL/min (by C-G formula based on SCr of 0.9 mg/dL).  Liver Function Tests: Recent Labs  Lab 09/30/20 2143  AST 14*  ALT 18  ALKPHOS 49  BILITOT 0.4  PROT 6.8  ALBUMIN 3.8   Urine analysis:    Component Value Date/Time   COLORURINE STRAW (A) 09/30/2020 2203   APPEARANCEUR CLEAR 09/30/2020 2203   LABSPEC 1.015 09/30/2020 2203   PHURINE 7.0 09/30/2020 2203   GLUCOSEU NEGATIVE 09/30/2020 2203   HGBUR SMALL (A) 09/30/2020 2203   BILIRUBINUR NEGATIVE 09/30/2020 2203   KETONESUR NEGATIVE 09/30/2020 2203   PROTEINUR NEGATIVE 09/30/2020 2203   UROBILINOGEN 0.2 10/14/2012 1800   NITRITE NEGATIVE 09/30/2020 2203   LEUKOCYTESUR NEGATIVE 09/30/2020 2203   Radiological Exams on Admission: CT Angio Head W or Wo Contrast  Result Date: 09/30/2020 CLINICAL DATA:  Initial evaluation for acute neuro deficit, stroke suspected. EXAM: CT ANGIOGRAPHY HEAD AND NECK TECHNIQUE: Multidetector CT imaging of the head and neck was performed using the standard protocol during bolus administration of intravenous contrast. Multiplanar CT image reconstructions and  MIPs were obtained to evaluate the vascular anatomy. Carotid stenosis measurements (when applicable) are obtained utilizing NASCET criteria, using the distal internal carotid diameter as the denominator. CONTRAST:  131mL OMNIPAQUE IOHEXOL 350 MG/ML SOLN COMPARISON:  None available. FINDINGS: CT HEAD FINDINGS Brain: Cerebral volume within normal limits for patient age. No evidence for acute intracranial hemorrhage. No findings to suggest acute large vessel territory infarct. No mass lesion, midline shift, or mass effect. Ventricles are normal in size without evidence for hydrocephalus. No extra-axial fluid collection identified. Vascular: No hyperdense vessel identified. Skull: Scalp soft tissues demonstrate no acute abnormality. Calvarium intact. Sinuses/Orbits: Globes and orbital soft tissues within normal limits. Visualized paranasal sinuses are clear. No mastoid effusion. CTA NECK FINDINGS Aortic arch: Visualized aortic arch of normal caliber with normal branch pattern. No hemodynamically significant stenosis about the origin of the great vessels. Visualized subclavian arteries widely patent. Right carotid system: Right common and internal carotid arteries widely patent without stenosis, dissection or occlusion. Left carotid system: Left common and internal carotid arteries widely patent without stenosis, dissection or occlusion. Vertebral arteries: Both vertebral arteries arise from the subclavian arteries. No proximal subclavian artery stenosis. Both vertebral arteries widely patent without stenosis, dissection or occlusion. Skeleton: No acute osseous finding. No discrete or worrisome osseous lesions. Poor dentition noted. Other neck: No other acute soft tissue abnormality within the neck. No mass or adenopathy. Upper chest: Visualized upper chest demonstrates no acute finding. Review of the MIP images confirms the above findings CTA HEAD FINDINGS Anterior circulation: Petrous segments widely patent. Minimal  for age atheromatous plaque within the carotid siphons without stenosis. A1 segments widely patent. Normal anterior communicating artery complex. Anterior cerebral arteries patent to their distal aspects without stenosis. No M1 stenosis  or occlusion. Normal MCA bifurcations. Distal MCA branches well perfused and symmetric. Posterior circulation: Both V4 segments patent to the vertebrobasilar junction without stenosis. Both PICA origins patent and normal. Basilar widely patent to its distal aspect without stenosis. Superior cerebellar arteries patent bilaterally. Both PCAs primarily supplied via the basilar and are well perfused to there distal aspects. Venous sinuses: Patent. Anatomic variants: None significant.  No aneurysm. Review of the MIP images confirms the above findings IMPRESSION: CT HEAD IMPRESSION: Negative head CT.  No acute intracranial abnormality identified. CTA HEAD AND NECK IMPRESSION: Negative CTA of the head and neck. No large vessel occlusion, hemodynamically significant stenosis, or other acute vascular abnormality. Electronically Signed   By: Jeannine Boga M.D.   On: 09/30/2020 22:17   CT Angio Neck W and/or Wo Contrast  Result Date: 09/30/2020 CLINICAL DATA:  Initial evaluation for acute neuro deficit, stroke suspected. EXAM: CT ANGIOGRAPHY HEAD AND NECK TECHNIQUE: Multidetector CT imaging of the head and neck was performed using the standard protocol during bolus administration of intravenous contrast. Multiplanar CT image reconstructions and MIPs were obtained to evaluate the vascular anatomy. Carotid stenosis measurements (when applicable) are obtained utilizing NASCET criteria, using the distal internal carotid diameter as the denominator. CONTRAST:  140mL OMNIPAQUE IOHEXOL 350 MG/ML SOLN COMPARISON:  None available. FINDINGS: CT HEAD FINDINGS Brain: Cerebral volume within normal limits for patient age. No evidence for acute intracranial hemorrhage. No findings to suggest acute  large vessel territory infarct. No mass lesion, midline shift, or mass effect. Ventricles are normal in size without evidence for hydrocephalus. No extra-axial fluid collection identified. Vascular: No hyperdense vessel identified. Skull: Scalp soft tissues demonstrate no acute abnormality. Calvarium intact. Sinuses/Orbits: Globes and orbital soft tissues within normal limits. Visualized paranasal sinuses are clear. No mastoid effusion. CTA NECK FINDINGS Aortic arch: Visualized aortic arch of normal caliber with normal branch pattern. No hemodynamically significant stenosis about the origin of the great vessels. Visualized subclavian arteries widely patent. Right carotid system: Right common and internal carotid arteries widely patent without stenosis, dissection or occlusion. Left carotid system: Left common and internal carotid arteries widely patent without stenosis, dissection or occlusion. Vertebral arteries: Both vertebral arteries arise from the subclavian arteries. No proximal subclavian artery stenosis. Both vertebral arteries widely patent without stenosis, dissection or occlusion. Skeleton: No acute osseous finding. No discrete or worrisome osseous lesions. Poor dentition noted. Other neck: No other acute soft tissue abnormality within the neck. No mass or adenopathy. Upper chest: Visualized upper chest demonstrates no acute finding. Review of the MIP images confirms the above findings CTA HEAD FINDINGS Anterior circulation: Petrous segments widely patent. Minimal for age atheromatous plaque within the carotid siphons without stenosis. A1 segments widely patent. Normal anterior communicating artery complex. Anterior cerebral arteries patent to their distal aspects without stenosis. No M1 stenosis or occlusion. Normal MCA bifurcations. Distal MCA branches well perfused and symmetric. Posterior circulation: Both V4 segments patent to the vertebrobasilar junction without stenosis. Both PICA origins patent  and normal. Basilar widely patent to its distal aspect without stenosis. Superior cerebellar arteries patent bilaterally. Both PCAs primarily supplied via the basilar and are well perfused to there distal aspects. Venous sinuses: Patent. Anatomic variants: None significant.  No aneurysm. Review of the MIP images confirms the above findings IMPRESSION: CT HEAD IMPRESSION: Negative head CT.  No acute intracranial abnormality identified. CTA HEAD AND NECK IMPRESSION: Negative CTA of the head and neck. No large vessel occlusion, hemodynamically significant stenosis, or other acute vascular abnormality.  Electronically Signed   By: Jeannine Boga M.D.   On: 09/30/2020 22:17   EKG: Independently reviewed. Vent. rate 59 BPM PR interval * ms QRS duration 109 ms QT/QTc 437/433 ms P-R-T axes 60 82 57 Sinus rhythm Normal ECG  Assessment/Plan Principal Problem:   TIA (transient ischemic attack) Observation/telemetry. Frequent neurochecks. Consult PT/OT/SLP. Continue Xarelto per neurology. Avoid hypotension. Check fasting lipids and hemoglobin A1c. Check carotid Doppler and echocardiogram. Check MR and venous MR of head. Consult neurology.  Active Problems:   Essential hypertension  Continue amlodipine 10 mg p.o. daily. Continue metoprolol ER 50 mg p.o. daily. Monitor BP and heart rate.   Coronary artery disease due to lipid rich plaque Continue amlodipine, metoprolol, simvastatin and Xarelto.    Paroxysmal atrial fibrillation (HCC) CHA?DS?-VASc Score of at least 5. Continue Xarelto 20 mg p.o. daily. Continue metoprolol for rate control.    HLD (hyperlipidemia) Continue simvastatin 20 mg p.o. daily.    GERD (gastroesophageal reflux disease) Continue esomeprazole 40 mg p.o. BID or formulary equivalent.    Cigarette smoker Declined nicotine replacement therapy. Encouraged to cease smoking. Staff to provide tobacco cessation information.    Controlled diabetes mellitus type 2  with complications (HCC) Carbohydrate modified diet. Check hemoglobin A1c. Continue Januvia 100 mg p.o. daily. Continue Metformin 500 mg p.o. twice daily. CBG monitoring with RI SS.    Anxiety and depression On alprazolam as needed. Follow-up with PCP.      DVT prophylaxis: On Xarelto. Code Status:   Full code. Family Communication: Disposition Plan:   Patient is from:  Home.  Anticipated DC to:  Home.  Anticipated DC date:  10/01/2020.  Anticipated DC barriers: Pending work-up and clinical status.  Consults called:  Routine neurology consult. Admission status:  Observation/telemetry.   Severity of Illness: High severity due to presenting with symptoms of TIA on a patient with a history of atrial fibrillation cerebrovascular disease.  The patient will need to remain for 24 to 48 hours for close neurological monitoring, further work-up and evaluation by neurology.   Reubin Milan MD [facility]  How to contact the [facility] Attending or Consulting provider [place] or covering provider during after hours [place], for this patient?   1. Check the care team in [facility] and look for a) attending/consulting [facility] provider listed and b) the [facility] team listed 2. Log into [email or web address] and use [place]'s universal password to access. If you do not have the password, please contact the hospital operator. 3. Locate the [facility] provider you are looking for under [facility] and page to a number that you can be directly reached. 4. If you still have difficulty reaching the provider, please page the [facility] (Director on Call) for the Hospitalists listed on amion for assistance.  09/30/2020, 11:55 PM   This document was prepared using Dragon voice recognition software and may contain some unintended transcription errors.

## 2020-10-01 ENCOUNTER — Observation Stay (HOSPITAL_BASED_OUTPATIENT_CLINIC_OR_DEPARTMENT_OTHER): Payer: Self-pay

## 2020-10-01 ENCOUNTER — Observation Stay (HOSPITAL_COMMUNITY): Payer: Self-pay

## 2020-10-01 DIAGNOSIS — G459 Transient cerebral ischemic attack, unspecified: Secondary | ICD-10-CM

## 2020-10-01 DIAGNOSIS — E782 Mixed hyperlipidemia: Secondary | ICD-10-CM

## 2020-10-01 DIAGNOSIS — I480 Paroxysmal atrial fibrillation: Secondary | ICD-10-CM

## 2020-10-01 DIAGNOSIS — K219 Gastro-esophageal reflux disease without esophagitis: Secondary | ICD-10-CM

## 2020-10-01 DIAGNOSIS — E118 Type 2 diabetes mellitus with unspecified complications: Secondary | ICD-10-CM

## 2020-10-01 LAB — LIPID PANEL
Cholesterol: 112 mg/dL (ref 0–200)
HDL: 30 mg/dL — ABNORMAL LOW (ref >40)
LDL Cholesterol: 66 mg/dL (ref 0–99)
Total CHOL/HDL Ratio: 3.7 RATIO
Triglycerides: 78 mg/dL (ref <150)
VLDL: 16 mg/dL (ref 0–40)

## 2020-10-01 LAB — GLUCOSE, CAPILLARY
Glucose-Capillary: 113 mg/dL — ABNORMAL HIGH (ref 70–99)
Glucose-Capillary: 139 mg/dL — ABNORMAL HIGH (ref 70–99)

## 2020-10-01 LAB — HEMOGLOBIN A1C
Hgb A1c MFr Bld: 6.4 % — ABNORMAL HIGH (ref 4.8–5.6)
Mean Plasma Glucose: 136.98 mg/dL

## 2020-10-01 LAB — ECHOCARDIOGRAM COMPLETE BUBBLE STUDY
Area-P 1/2: 3.21 cm2
S' Lateral: 2.6 cm

## 2020-10-01 LAB — HIV ANTIBODY (ROUTINE TESTING W REFLEX): HIV Screen 4th Generation wRfx: NONREACTIVE

## 2020-10-01 LAB — TSH: TSH: 3.119 u[IU]/mL (ref 0.350–4.500)

## 2020-10-01 MED ORDER — CYCLOBENZAPRINE HCL 10 MG PO TABS: 10.0000 mg | ORAL_TABLET | ORAL | Status: DC | PRN

## 2020-10-01 MED ORDER — INSULIN ASPART 100 UNIT/ML ~~LOC~~ SOLN
0.0000 [IU] | Freq: Three times a day (TID) | SUBCUTANEOUS | Status: DC
  Administered 2020-10-01: 3 [IU] via SUBCUTANEOUS

## 2020-10-01 MED ORDER — INSULIN ASPART 100 UNIT/ML ~~LOC~~ SOLN
3.0000 [IU] | Freq: Three times a day (TID) | SUBCUTANEOUS | Status: DC
  Administered 2020-10-01 (×2): 3 [IU] via SUBCUTANEOUS

## 2020-10-01 MED ORDER — METOPROLOL SUCCINATE ER 50 MG PO TB24: 50.0000 mg | ORAL_TABLET | Freq: Every day | ORAL | 3 refills | Status: DC

## 2020-10-01 MED ORDER — ENSURE ENLIVE PO LIQD
237.0000 mL | Freq: Two times a day (BID) | ORAL | Status: DC
  Administered 2020-10-01 (×2): 237 mL via ORAL

## 2020-10-01 MED ORDER — INSULIN ASPART 100 UNIT/ML ~~LOC~~ SOLN: 0.0000 [IU] | Freq: Every day | SUBCUTANEOUS | Status: DC

## 2020-10-01 MED ORDER — LORAZEPAM 2 MG/ML IJ SOLN: 0.7500 mg | Freq: Once | INTRAMUSCULAR | Status: DC | PRN

## 2020-10-01 NOTE — Progress Notes (Signed)
SLP Cancellation Note  Patient Details Name: Richard [name] MRN: [id number] DOB: [birth year]   Cancelled treatment:       Reason Eval/Treat Not Completed: SLP screened, no needs identified, will sign off. Spoke with Patient and Patient's wife who report all symptoms have resolved; he reports he did have some "slowness" getting his words out when this episode initially came on but that that has now resolved. Pt is at baseline for cognition, speech and language. ST will sign off at this time. Thank you,  Greyson Riccardi H. Roddie Mc, CCC-SLP Speech Language Pathologist    Wende Bushy 10/01/2020, 10:23 AM

## 2020-10-01 NOTE — ED Notes (Signed)
Patient transported to MRI 

## 2020-10-01 NOTE — ED Notes (Signed)
Report given to Brooke, RN.

## 2020-10-01 NOTE — ED Notes (Signed)
Lights dimmed and urinal emptied at this time. Pt resting quietly with no signs of distress.

## 2020-10-01 NOTE — Progress Notes (Signed)
Nutrition Brief Note  Patient identified on the Malnutrition Screening Tool (MST) Report  Wt Readings from Last 15 Encounters:  09/30/20 83.9 kg  08/11/20 87.1 kg  07/03/20 87.5 kg  06/29/20 88 kg  06/03/20 88.4 kg  05/05/20 88.9 kg  03/18/20 87.1 kg  02/09/20 87.5 kg  12/02/19 90 kg  06/04/19 89.3 kg  04/09/19 88.9 kg  03/28/19 89.8 kg  03/27/19 89.4 kg  11/08/18 91.6 kg  09/10/18 93.4 kg   Richard [name] is a 55 y.o. male with medical history significant of osteoarthritis, asthma/COPD, unspecified colitis, anxiety/depression, panic attacks, PUD, GERD, hiatal hernia, hepatic asteatosis, hyperlipidemia, hypertension, mild CAD, paroxysmal atrial fibrillation, history of CVA with residual left facial deficit, TIA after CAD in January 2017, tobacco abuse (per patient, down to 2 cigarettes/day), type 2 diabetes mellitus who is coming to the emergency department after having lateral hemianopsia of the right eye followed by fronto-coronal headache associated with generalized weakness and mild ataxia.  Pt admitted with TIA.   Reviewed I/O's: -1.5 L x 24 hours  UOP: 1.5 L x 24 hours  Pt resting in bed, preparing for test at time of visit.   Per MST report, pt reports intentional weight loss secondary to lifestyle modifications (following a low fat diet).   Reviewed wt hx; pt has experienced a 3.7% wt loss over the past 6 months, which is not significant for time frame.    Nutrition-Focused physical exam completed. Findings are no fat depletion, no muscle depletion, and no edema.   Current diet order is heart healthy/ carb modified. Labs and medications reviewed.   No nutrition interventions warranted at this time. If nutrition issues arise, please consult RD.   Loistine Chance, RD, LDN, [place] Registered Dietitian II Certified Diabetes Care and Education Specialist Please refer to [facility] for RD and/or RD on-call/weekend/after hours pager

## 2020-10-01 NOTE — Discharge Instructions (Signed)
  IMPORTANT INFORMATION: PAY CLOSE ATTENTION   PHYSICIAN DISCHARGE INSTRUCTIONS  Follow with Primary care provider  McElroy, Shannon, PA-C  and other consultants as instructed by your Hospitalist Physician  SEEK MEDICAL CARE OR RETURN TO EMERGENCY ROOM IF SYMPTOMS COME BACK, WORSEN OR NEW PROBLEM DEVELOPS   Please note: You were cared for by a hospitalist during your hospital stay. Every effort will be made to forward records to your primary care provider.  You can request that your primary care provider send for your hospital records if they have not received them.  Once you are discharged, your primary care physician will handle any further medical issues. Please note that NO REFILLS for any discharge medications will be authorized once you are discharged, as it is imperative that you return to your primary care physician (or establish a relationship with a primary care physician if you do not have one) for your post hospital discharge needs so that they can reassess your need for medications and monitor your lab values.  Please get a complete blood count and chemistry panel checked by your Primary MD at your next visit, and again as instructed by your Primary MD.  Get Medicines reviewed and adjusted: Please take all your medications with you for your next visit with your Primary MD  Laboratory/radiological data: Please request your Primary MD to go over all hospital tests and procedure/radiological results at the follow up, please ask your primary care provider to get all Hospital records sent to his/her office.  In some cases, they will be blood work, cultures and biopsy results pending at the time of your discharge. Please request that your primary care provider follow up on these results.  If you are diabetic, please bring your blood sugar readings with you to your follow up appointment with primary care.    Please call and make your follow up appointments as soon as possible.    Also  Note the following: If you experience worsening of your admission symptoms, develop shortness of breath, life threatening emergency, suicidal or homicidal thoughts you must seek medical attention immediately by calling 911 or calling your MD immediately  if symptoms less severe.  You must read complete instructions/literature along with all the possible adverse reactions/side effects for all the Medicines you take and that have been prescribed to you. Take any new Medicines after you have completely understood and accpet all the possible adverse reactions/side effects.   Do not drive when taking Pain medications or sleeping medications (Benzodiazepines)  Do not take more than prescribed Pain, Sleep and Anxiety Medications. It is not advisable to combine anxiety,sleep and pain medications without talking with your primary care practitioner  Special Instructions: If you have smoked or chewed Tobacco  in the last 2 yrs please stop smoking, stop any regular Alcohol  and or any Recreational drug use.  Wear Seat belts while driving.  Do not drive if taking any narcotic, mind altering or controlled substances or recreational drugs or alcohol.       

## 2020-10-01 NOTE — Progress Notes (Signed)
  Echocardiogram 2D Echocardiogram has been performed.  Richard [name] 10/01/2020, 3:35 PM

## 2020-10-01 NOTE — ED Notes (Signed)
Pt given frozen dinner tray and beverage per request.

## 2020-10-01 NOTE — ED Notes (Signed)
Urinal emptied and snack given at this time.

## 2020-10-01 NOTE — Consult Note (Signed)
[place] A. Merlene Laughter, MD     [email or web address]          Richard [name] is an 55 y.o. male.   ASSESSMENT/PLAN: 1.  Acute transient visual loss associated with the headaches; the semiology is typical of acute migraine headache syndrome but the patient does not have a history of headache.  Given his risk factors and previous events, TIA seems most likely however.  Patient already is on anticoagulation and therefore no additional measures are recommended at this time.  Continue with risk factor modifications including blood pressure control, blood sugar control use of statin medication and smoking cessation.    The patient is a 55 year old white male who presents with acute onset of visual loss involving the right eye.  The description is consistent with a hemianopia involving the right eye.  The patient does not seem to describe a homonymous hemianopia however.  This lasted for about 45 minutes and was followed immediately by severe headaches.  The patient reports that been a person who have episodic headaches at baseline.  The patient however has been having episodes of eye pain especially when watching TV recently over the last month or so. He does have episodic palpitations and chest discomfort which he describes as his atrial fibrillation.  He did have this a couple hours before this event.  He does not report focal numbness or weakness or loss of consciousness.  The patient reports having left shoulder pain and weakness apparently from muscle tendon issue which is chronic.  The review of systems otherwise negative.    GENERAL: This is a pleasant male who is doing well at this time.  HEENT: Neck is supple no trauma noted.  ABDOMEN: soft  EXTREMITIES: No edema; slightly reduced range of motion of the left shoulder due to pain.  BACK: Normal  SKIN: Normal by inspection.    MENTAL STATUS: Alert and oriented. Speech, language and cognition are generally intact.  Judgment and insight normal.   CRANIAL NERVES: Pupils are equal, round and reactive to light and accomodation; extra ocular movements are full, there is no significant nystagmus; visual fields are full; upper and lower facial muscles are normal in strength and symmetric, there is no flattening of the nasolabial folds; tongue is midline; uvula is midline; shoulder elevation is normal.  MOTOR: Normal tone, bulk and strength; no pronator drift.  There is no drift of the upper or lower extremities.  COORDINATION: Left finger to nose is normal, right finger to nose is normal, No rest tremor; no intention tremor; no postural tremor; no bradykinesia.  REFLEXES: Deep tendon reflexes are symmetrical and normal. Plantar reflexes are flexor bilaterally.   SENSATION: Normal to light touch, temperature, and pain.      Blood pressure 136/78, pulse (!) 53, temperature 98.2 F (36.8 C), temperature source Oral, resp. rate 16, height 5\' 11"  (1.803 m), weight 83.9 kg, SpO2 99 %.  Past Medical History:  Diagnosis Date  . Arthritis    "legs, spine, shoulders" (08/12/2015)  . Asthma   . Colitis 1990  . COPD (chronic obstructive pulmonary disease) ([place])   . Depression   . Gastric ulcer 2003; 2012   2003: + esophagitis; negative H.pylori serology  2012: Dr. Oneida Alar, mild gastritis, Bravo PH probe placement, negative H.pylori  . GERD (gastroesophageal reflux disease)   . Hepatic steatosis   . History of hiatal hernia   . Hyperlipemia   . Hypertension   . Mild CAD    a. Cardiac  cath 07/2015 showed 65% distal Cx, 20% mid-distal LAD, 20% prox-distal RCA, EF 60%, EDP 72mmHg.  [name] Overweight   . Panic attacks   . Paroxysmal atrial fibrillation (HCC)   . Stroke [facility])    mucsle in left side of face dosent work as its supposed to-only deficit.  [name] TIA (transient ischemic attack)    after cath 1/ 17  . Tobacco abuse    1/2 pack per day  . Type II diabetes mellitus ([place])     Past Surgical History:   Procedure Laterality Date  . BRAVO [place] STUDY  05/03/2011   ASN:KNLZ gastritis/normal esophagus and duodenum  . CARDIAC CATHETERIZATION  1990s X 1; 2005; 08/12/2015  . CARDIAC CATHETERIZATION N/A 08/12/2015   Procedure: Left Heart Cath and Coronary Angiography;  Surgeon: Belva Crome, MD; LAD 20%, CFX 65%, RCA 20%, EF 60%   . COLONOSCOPY  1990  . COLONOSCOPY WITH PROPOFOL N/A 11/21/2016   Dr. Oneida Alar: non-thrombosed external hemorrhoids, one 6 mm polyp (polypoid lesion), internal hemorrhoids. TI Normal. 10 years screening  . ESOPHAGOGASTRODUODENOSCOPY  05/03/2011   JQB:HALP gastritis  . NECK MASS EXCISION Right    "done in dr's office; behind right ear/side of ncek"  . POLYPECTOMY  11/21/2016   Procedure: POLYPECTOMY;  Surgeon: [name], MD;  Location: [facility];  Service: Endoscopy;;  descending colon polyp  . SHOULDER ARTHROSCOPY W/ ROTATOR CUFF REPAIR Right 2006   acromioclavicular joint arthrosis    Family History  Problem Relation Age of Onset  . Lung cancer Mother   . Alcohol abuse Mother   . Heart attack Father 14  . Diabetes Father   . Alcohol abuse Father   . Hypertension Brother   . Hypertension Brother   . Anxiety disorder Sister   . Depression Sister   . Anxiety disorder Sister   . Heart attack Brother 57  . Diabetes Brother   . Hypertension Brother   . Seizures Brother   . Dementia Paternal Uncle   . Dementia Cousin   . ADD / ADHD Daughter   . Colon cancer Neg Hx   . Drug abuse Neg Hx   . Bipolar disorder Neg Hx   . OCD Neg Hx   . Paranoid behavior Neg Hx   . Schizophrenia Neg Hx   . Sexual abuse Neg Hx   . Physical abuse Neg Hx     Social History:  reports that he has been smoking cigarettes. He started smoking about 38 years ago. He has a 6.25 pack-year smoking history. He has never used smokeless tobacco. He reports that he does not drink alcohol and does not use drugs.  Allergies:  Allergies  Allergen Reactions  . Dexilant [Dexlansoprazole]  Anaphylaxis  . Mushroom Ext Cmplx(Shiitake-Reishi-Mait) Anaphylaxis    Rapid heart rate.  . Penicillins Anaphylaxis    Has patient had a PCN reaction causing immediate rash, facial/tongue/throat swelling, SOB or lightheadedness with hypotension: Yes Has patient had a PCN reaction causing severe rash involving mucus membranes or skin necrosis: No Has patient had a PCN reaction that required hospitalization Yes Has patient had a PCN reaction occurring within the last 10 years: No If all of the above answers are "NO", then may proceed with Cephalosporin use.   [name] Doxycycline Nausea And Vomiting         Medications: Prior to Admission medications   Medication Sig Start Date End Date Taking? Authorizing Provider  ALPRAZolam Duanne Moron) 0.5 MG tablet Take one tablet by mouth  three times a day and two tablets at night 09/13/20  Yes Cloria Spring, MD  aluminum-magnesium hydroxide 200-200 MG/5ML suspension Take by mouth every 6 (six) hours as needed for indigestion.   Yes [provider]  amLODipine (NORVASC) 10 MG tablet Take 1 tablet (10 mg total) by mouth daily. 02/02/20  Yes Allred, Jeneen Rinks, MD  budesonide-formoterol (SYMBICORT) 80-4.5 MCG/ACT inhaler Inhale 2 puffs into the lungs 2 (two) times daily as needed.    Yes [provider]  calcium carbonate (TUMS - DOSED IN MG ELEMENTAL CALCIUM) 500 MG chewable tablet Chew 1 tablet by mouth daily as needed for indigestion or heartburn.   Yes [provider]  cyclobenzaprine (FLEXERIL) 10 MG tablet Take 1 tablet (10 mg total) by mouth 3 (three) times daily. Patient taking differently: Take 10 mg by mouth as needed for muscle spasms. 10/07/15  Yes Lily Kocher, PA-C  esomeprazole (NEXIUM) 40 MG capsule Take 1 capsule (40 mg total) by mouth 2 (two) times daily before a meal. 06/03/20  Yes Annitta Needs, NP  JANUVIA 100 MG tablet TAKE 1 Tablet BY MOUTH ONCE DAILY Patient taking differently: Take 100 mg by mouth daily. 07/27/20  Yes  [name], PA-C  meclizine (ANTIVERT) 25 MG tablet Take 1 tablet (25 mg total) by mouth 3 (three) times daily as needed for dizziness. 09/01/20  Yes [name], PA-C  metFORMIN (GLUCOPHAGE) 500 MG tablet Take 500 mg by mouth 2 (two) times daily with a meal.   Yes [provider]  metoprolol succinate (TOPROL-XL) 50 MG 24 hr tablet Take 1 and 1/2 tabs by mouth  with or immediately following a meal. Patient taking differently: Take 75 mg by mouth daily. Take 1 and 1/2 tabs by mouth  with or immediately following a meal. 05/06/20  Yes Cleaver, Jossie Ng, NP  nitroGLYCERIN (NITROSTAT) 0.4 MG SL tablet Place 1 tablet (0.4 mg total) under the tongue every 5 (five) minutes x 3 doses as needed for chest pain (if no relief after 3rd dose, proceed to the ED for an evaluation). 02/02/20  Yes Allred, Jeneen Rinks, MD  PROVENTIL HFA [phone] Base) MCG/ACT inhaler INHALE 2 PUFFS BY MOUTH EVERY 6 HOURS AS NEEDED FOR COUGHING, WHEEZING, OR SHORTNESS OF BREATH 07/27/20  Yes [name], PA-C  simvastatin (ZOCOR) 20 MG tablet TAKE 1 TABLET BY MOUTH AT BEDTIME Patient taking differently: Take 20 mg by mouth at bedtime. 08/16/20  Yes [name], PA-C  XARELTO 20 MG TABS tablet Take 1 tablet (20 mg total) by mouth daily with supper. 02/11/20  Yes Verta Ellen., NP  cetirizine (ZYRTEC ALLERGY) 10 MG tablet Take 1 tablet (10 mg total) by mouth daily. 07/03/20   Avegno, Darrelyn Hillock, FNP  clindamycin (CLEOCIN) 300 MG capsule Take 1 capsule (300 mg total) by mouth 3 (three) times daily. 09/08/20   [name], PA-C  HYDROcodone-acetaminophen (NORCO/VICODIN) 5-325 MG tablet Take one to two tablets every 4-6 hours as needed for pain 01/04/17   Marybelle Killings, MD  sucralfate (CARAFATE) 1 GM/10ML suspension Take 10 mLs (1 g total) by mouth 4 (four) times daily. 09/24/20   Annitta Needs, NP    Scheduled Meds: . amLODipine  10 mg Oral Daily  . insulin aspart  0-15 Units Subcutaneous TID WC  . insulin  aspart  0-5 Units Subcutaneous QHS  . insulin aspart  3 Units Subcutaneous TID WC  . linagliptin  5 mg Oral Daily  . metoprolol succinate  75  mg Oral Daily  . pantoprazole  40 mg Oral Daily  . rivaroxaban  20 mg Oral Q supper  . simvastatin  20 mg Oral QHS  . sucralfate  1 g Oral QID   Continuous Infusions: PRN Meds:.acetaminophen **OR** acetaminophen, ALPRAZolam, LORazepam, meclizine, nitroGLYCERIN, ondansetron **OR** ondansetron (ZOFRAN) IV     Results for orders placed or performed during the hospital encounter of 09/30/20 (from the past 48 hour(s))  Ethanol     Status: None   Collection Time: 09/30/20  9:43 PM  Result Value Ref Range   Alcohol, Ethyl (B) <10 <10 mg/dL    Comment: (NOTE) Lowest detectable limit for serum alcohol is 10 mg/dL.  For medical purposes only. Performed at [facility], [address], [place], [place] [address]   Protime-INR     Status: None   Collection Time: 09/30/20  9:43 PM  Result Value Ref Range   Prothrombin Time 14.6 11.4 - 15.2 seconds   INR 1.2 0.8 - 1.2    Comment: (NOTE) INR goal varies based on device and disease states. Performed at [facility], [address], [place], [place] [address]   APTT     Status: None   Collection Time: 09/30/20  9:43 PM  Result Value Ref Range   aPTT 33 24 - 36 seconds    Comment: Performed at [facility], [address] [place], [place] [address]  CBC     Status: None   Collection Time: 09/30/20  9:43 PM  Result Value Ref Range   WBC 6.8 4.0 - 10.5 K/uL   RBC 4.98 4.22 - 5.81 MIL/uL   Hemoglobin 15.7 13.0 - 17.0 g/dL   HCT 45.5 39.0 - 52.0 %   MCV 91.4 80.0 - 100.0 fL   MCH 31.5 26.0 - 34.0 pg   MCHC 34.5 30.0 - 36.0 g/dL   RDW 12.3 11.5 - 15.5 %   Platelets 183 150 - 400 K/uL   nRBC 0.0 0.0 - 0.2 %    Comment: Performed at [facility], [address], [place], [place] [address]  Differential     Status: None   Collection Time: 09/30/20  9:43 PM  Result Value Ref Range   Neutrophils  Relative % 72 %   Neutro Abs 4.9 1.7 - 7.7 K/uL   Lymphocytes Relative 19 %   Lymphs Abs 1.3 0.7 - 4.0 K/uL   Monocytes Relative 7 %   Monocytes Absolute 0.5 0.1 - 1.0 K/uL   Eosinophils Relative 2 %   Eosinophils Absolute 0.1 0.0 - 0.5 K/uL   Basophils Relative 0 %   Basophils Absolute 0.0 0.0 - 0.1 K/uL   Immature Granulocytes 0 %   Abs Immature Granulocytes 0.01 0.00 - 0.07 K/uL    Comment: Performed at [facility], [address], [place],  [address]  Comprehensive metabolic panel     Status: Abnormal   Collection Time: 09/30/20  9:43 PM  Result Value Ref Range   Sodium 134 (L) 135 - 145 mmol/L   Potassium 3.6 3.5 - 5.1 mmol/L   Chloride 101 98 - 111 mmol/L   CO2 24 22 - 32 mmol/L   Glucose, Bld 138 (H) 70 - 99 mg/dL    Comment: Glucose reference range applies only to samples taken after fasting for at least 8 hours.   BUN 15 6 - 20 mg/dL   Creatinine, Ser 0.98 0.61 - 1.24 mg/dL   Calcium 8.8 (L) 8.9 - 10.3 mg/dL   Total  Protein 6.8 6.5 - 8.1 g/dL   Albumin 3.8 3.5 - 5.0 g/dL   AST 14 (L) 15 - 41 U/L   ALT 18 0 - 44 U/L   Alkaline Phosphatase 49 38 - 126 U/L   Total Bilirubin 0.4 0.3 - 1.2 mg/dL   GFR, Estimated >60 >60 mL/min    Comment: (NOTE) Calculated using the CKD-EPI Creatinine Equation (2021)    Anion gap 9 5 - 15    Comment: Performed at [facility], [address] [place], [place] [address]  I-stat chem 8, ED     Status: Abnormal   Collection Time: 09/30/20  9:55 PM  Result Value Ref Range   Sodium 138 135 - 145 mmol/L   Potassium 3.7 3.5 - 5.1 mmol/L   Chloride 98 98 - 111 mmol/L   BUN 15 6 - 20 mg/dL   Creatinine, Ser 0.90 0.61 - 1.24 mg/dL   Glucose, Bld 132 (H) 70 - 99 mg/dL    Comment: Glucose reference range applies only to samples taken after fasting for at least 8 hours.   Calcium, Ion 1.16 1.15 - 1.40 mmol/L   TCO2 23 22 - 32 mmol/L   Hemoglobin 15.3 13.0 - 17.0 g/dL   HCT 45.0 39.0 - 52.0 %  Resp Panel by RT-PCR (Flu A&B, Covid)  Nasopharyngeal Swab     Status: None   Collection Time: 09/30/20 10:03 PM   Specimen: Nasopharyngeal Swab; Nasopharyngeal(NP) swabs in vial transport medium  Result Value Ref Range   SARS Coronavirus 2 by RT PCR NEGATIVE NEGATIVE    Comment: (NOTE) SARS-CoV-2 target nucleic acids are NOT DETECTED.  The SARS-CoV-2 RNA is generally detectable in upper respiratory specimens during the acute phase of infection. The lowest concentration of SARS-CoV-2 viral copies this assay can detect is 138 copies/mL. A negative result does not preclude SARS-Cov-2 infection and should not be used as the sole basis for treatment or other patient management decisions. A negative result may occur with  improper specimen collection/handling, submission of specimen other than nasopharyngeal swab, presence of viral mutation(s) within the areas targeted by this assay, and inadequate number of viral copies(<138 copies/mL). A negative result must be combined with clinical observations, patient history, and epidemiological information. The expected result is Negative.  Fact Sheet for Patients:  EntrepreneurPulse.com.au  Fact Sheet for Healthcare Providers:  IncredibleEmployment.be  This test is no t yet approved or cleared by the [place] FDA and  has been authorized for detection and/or diagnosis of SARS-CoV-2 by FDA under an Emergency Use Authorization (EUA). This EUA will remain  in effect (meaning this test can be used) for the duration of the COVID-19 declaration under Section 564(b)(1) of the Act, 21 U.S.C.section 360bbb-3(b)(1), unless the authorization is terminated  or revoked sooner.       Influenza A by PCR NEGATIVE NEGATIVE   Influenza B by PCR NEGATIVE NEGATIVE    Comment: (NOTE) The Xpert Xpress SARS-CoV-2/FLU/RSV plus assay is intended as an aid in the diagnosis of influenza from Nasopharyngeal swab specimens and should not be used as a sole basis for  treatment. Nasal washings and aspirates are unacceptable for Xpert Xpress SARS-CoV-2/FLU/RSV testing.  Fact Sheet for Patients: EntrepreneurPulse.com.au  Fact Sheet for Healthcare Providers: IncredibleEmployment.be  This test is not yet approved or cleared by the [place] FDA and has been authorized for detection and/or diagnosis of SARS-CoV-2 by FDA under an Emergency Use Authorization (EUA). This EUA will remain in effect (meaning this test can  be used) for the duration of the COVID-19 declaration under Section 564(b)(1) of the Act, 21 U.S.C. section 360bbb-3(b)(1), unless the authorization is terminated or revoked.  Performed at [facility], [address], [place], [place] [address]   Urine rapid drug screen (hosp performed)     Status: None   Collection Time: 09/30/20 10:03 PM  Result Value Ref Range   Opiates NONE DETECTED NONE DETECTED   Cocaine NONE DETECTED NONE DETECTED   Benzodiazepines NONE DETECTED NONE DETECTED   Amphetamines NONE DETECTED NONE DETECTED   Tetrahydrocannabinol NONE DETECTED NONE DETECTED   Barbiturates NONE DETECTED NONE DETECTED    Comment: (NOTE) DRUG SCREEN FOR MEDICAL PURPOSES ONLY.  IF CONFIRMATION IS NEEDED FOR ANY PURPOSE, NOTIFY LAB WITHIN 5 DAYS.  LOWEST DETECTABLE LIMITS FOR URINE DRUG SCREEN Drug Class                     Cutoff (ng/mL) Amphetamine and metabolites    1000 Barbiturate and metabolites    200 Benzodiazepine                 191 Tricyclics and metabolites     300 Opiates and metabolites        300 Cocaine and metabolites        300 THC                            50 Performed at [facility], [address], [place], [place] [address]   Urinalysis, Routine w reflex microscopic Nasopharyngeal Swab     Status: Abnormal   Collection Time: 09/30/20 10:03 PM  Result Value Ref Range   Color, Urine STRAW (A) YELLOW   APPearance CLEAR CLEAR   Specific Gravity, Urine 1.015 1.005 -  1.030   pH 7.0 5.0 - 8.0   Glucose, UA NEGATIVE NEGATIVE mg/dL   Hgb urine dipstick SMALL (A) NEGATIVE   Bilirubin Urine NEGATIVE NEGATIVE   Ketones, ur NEGATIVE NEGATIVE mg/dL   Protein, ur NEGATIVE NEGATIVE mg/dL   Nitrite NEGATIVE NEGATIVE   Leukocytes,Ua NEGATIVE NEGATIVE   Bacteria, UA NONE SEEN NONE SEEN    Comment: Performed at [facility], [address] [place], [place] [address]  Hemoglobin A1c     Status: Abnormal   Collection Time: 10/01/20  3:53 AM  Result Value Ref Range   Hgb A1c MFr Bld 6.4 (H) 4.8 - 5.6 %    Comment: (NOTE) Pre diabetes:          5.7%-6.4%  Diabetes:              >6.4%  Glycemic control for   <7.0% adults with diabetes    Mean Plasma Glucose 136.98 mg/dL    Comment: Performed at [facility] [address] [place], [place] [address]  Lipid panel     Status: Abnormal   Collection Time: 10/01/20  3:53 AM  Result Value Ref Range   Cholesterol 112 0 - 200 mg/dL   Triglycerides 78 <150 mg/dL   HDL 30 (L) >40 mg/dL   Total CHOL/HDL Ratio 3.7 RATIO   VLDL 16 0 - 40 mg/dL   LDL Cholesterol 66 0 - 99 mg/dL    Comment:        Total Cholesterol/HDL:CHD Risk Coronary Heart Disease Risk Table                     Men   Women  1/2 Average Risk   3.4   3.3  Average Risk       5.0   4.4  2 X Average Risk   9.6   7.1  3 X Average Risk  23.4   11.0        Use the calculated Patient Ratio above and the CHD Risk Table to determine the patient's CHD Risk.        ATP III CLASSIFICATION (LDL):  <100     mg/dL   Optimal  100-129  mg/dL   Near or Above                    Optimal  130-159  mg/dL   Borderline  160-189  mg/dL   High  >190     mg/dL   Very High Performed at [facility], [address] [place], [place] [address]   HIV Antibody (routine testing w rflx)     Status: None   Collection Time: 10/01/20  3:53 AM  Result Value Ref Range   HIV Screen 4th Generation wRfx Non Reactive Non Reactive    Comment: Performed at [facility], [place] [address] [place], [place] [address]  TSH     Status: None   Collection Time: 10/01/20  3:53 AM  Result Value Ref Range   TSH 3.119 0.350 - 4.500 uIU/mL    Comment: Performed by a 3rd Generation assay with a functional sensitivity of <=0.01 uIU/mL. Performed at [facility], [address] [place], [place] [address]   Glucose, capillary     Status: Abnormal   Collection Time: 10/01/20 11:54 AM  Result Value Ref Range   Glucose-Capillary 113 (H) 70 - 99 mg/dL    Comment: Glucose reference range applies only to samples taken after fasting for at least 8 hours.    Studies/Results:   HEAD NECK CTA  CTA NECK FINDINGS  Aortic arch: Visualized aortic arch of normal caliber with normal branch pattern. No hemodynamically significant stenosis about the origin of the great vessels. Visualized subclavian arteries widely patent.  Right carotid system: Right common and internal carotid arteries widely patent without stenosis, dissection or occlusion.  Left carotid system: Left common and internal carotid arteries widely patent without stenosis, dissection or occlusion.  Vertebral arteries: Both vertebral arteries arise from the subclavian arteries. No proximal subclavian artery stenosis. Both vertebral arteries widely patent without stenosis, dissection or occlusion.  Skeleton: No acute osseous finding. No discrete or worrisome osseous lesions. Poor dentition noted.  Other neck: No other acute soft tissue abnormality within the neck. No mass or adenopathy.  Upper chest: Visualized upper chest demonstrates no acute finding.  Review of the MIP images confirms the above findings  CTA HEAD FINDINGS  Anterior circulation: Petrous segments widely patent. Minimal for age atheromatous plaque within the carotid siphons without stenosis. A1 segments widely patent. Normal anterior communicating artery complex. Anterior cerebral arteries patent to their distal  aspects without stenosis. No M1 stenosis or occlusion. Normal MCA bifurcations. Distal MCA branches well perfused and symmetric.  Posterior circulation: Both V4 segments patent to the vertebrobasilar junction without stenosis. Both PICA origins patent and normal. Basilar widely patent to its distal aspect without stenosis. Superior cerebellar arteries patent bilaterally. Both PCAs primarily supplied via the basilar and are well perfused to there distal aspects.  Venous sinuses: Patent.  Anatomic variants: None significant.  No aneurysm.  Review of the MIP images confirms the above findings  IMPRESSION: CT HEAD  IMPRESSION:  Negative head CT.  No acute intracranial abnormality identified.  CTA HEAD AND NECK IMPRESSION:  Negative CTA of the head and neck. No large vessel occlusion, hemodynamically significant stenosis, or other acute vascular Abnormality.       BRAIN MRI/MRV FINDINGS: MRI HEAD WITHOUT CONTRAST  Brain: No acute infarction, hemorrhage, hydrocephalus, extra-axial collection or mass lesion.  Vascular: Major arterial flow voids are maintained at the skull base.  Skull and upper cervical spine: Normal marrow signal.  Sinuses/Orbits: Mucosal thickening of frontal sinuses, ethmoid air cells, and maxillary sinuses with suspected small right maxillary sinus retention cyst. Unremarkable orbits.  Other: Trace bilateral mastoid effusions.  MR VENOGRAM WITHOUT CONTRAST  No convincing evidence of dural sinus thrombosis. The anterior aspect the superior sagittal sinus is non-opacified, but this is favored to represent an atretic rather than acutely thrombosed sinus given small size of the sinus, best seen on recent CTA (likely also small on prior CT head from 2017). Small left transverse sinus, as also seen on recent CTA. Linear filling defects at the torcula and left transverse/sigmoid junction, favored to relate to  turbulent flow.  IMPRESSION: MRI head:  No evidence of acute intracranial abnormality.  MRV:  No convincing evidence of dural sinus thrombosis. The anterior aspect the superior sagittal sinus is non-opacified, but this is favored to represent an atretic rather than acutely thrombosed sinus given the small size of the sinus, best seen on recent CTA (and likely also small on prior CT head from 2017).     The brain MRI scan is reviewed in person and shows no evidence of acute changes particularly involving DWI. No hemorrhages noted. No white matter lesions are noted and no encephalomalacia.      Cheral Cappucci A. Merlene Laughter, M.D.  Diplomate, Tax adviser of Psychiatry and Neurology ( Neurology). 10/01/2020, 4:02 PM

## 2020-10-01 NOTE — Evaluation (Signed)
Physical Therapy Evaluation Patient Details Name: Richard [name] MRN: [id number] DOB: 1965/08/20 Today's Date: 10/01/2020   History of Present Illness  Richard [name] is a 55 y.o. male with medical history significant of osteoarthritis, asthma/COPD, unspecified colitis, anxiety/depression, panic attacks, PUD, GERD, hiatal hernia, hepatic asteatosis, hyperlipidemia, hypertension, mild CAD, paroxysmal atrial fibrillation, history of CVA with residual left facial deficit, TIA after CAD in January 2017, tobacco abuse (per patient, down to 2 cigarettes/day), type 2 diabetes mellitus who is coming to the emergency department after having lateral hemianopsia of the right eye followed by fronto-coronal headache associated with generalized weakness and mild ataxia.  The patient stated that he had a 45-minute period of palpitations prior to his vision changes.  He denies focal weakness or numbness, vertigo sensation, nausea, emesis, slurred speech or inability to comprehend language.  No history of fever, chills, sore throat, rhinorrhea, wheezing, dyspnea or hemoptysis.  Denies chest pain, diaphoresis, PND, orthopnea or pitting edema of the lower extremities.  Denies abdominal pain, diarrhea, constipation, melena or hematochezia.  No dysuria, frequency or hematuria.  Denies polyuria, polydipsia, polyphagia or blurred vision.    Clinical Impression  Patient functioning at baseline for functional mobility and gait. Patient with intact sensation and strength in bilateral LE. Patient independent with all mobility today without assist or AD. Patient able to ambulate without loss of balance. Patient left with wife and RN present. Patient discharged to care of nursing for ambulation daily as tolerated for length of stay.      Follow Up Recommendations No PT follow up    Equipment Recommendations  None recommended by PT    Recommendations for Other Services       Precautions / Restrictions  Precautions Precautions: None Restrictions Weight Bearing Restrictions: No      Mobility  Bed Mobility Overal bed mobility: Independent                  Transfers Overall transfer level: Independent Equipment used: None             General transfer comment: transfers to standing without assist or AD  Ambulation/Gait Ambulation/Gait assistance: Independent Gait Distance (Feet): 80 Feet Assistive device: None Gait Pattern/deviations: WFL(Within Functional Limits) Gait velocity: decreased   General Gait Details: ambulates with slightly decreased cadence without loss of balance  Stairs            Wheelchair Mobility    Modified Rankin (Stroke Patients Only)       Balance Overall balance assessment: Independent                                           Pertinent Vitals/Pain Pain Assessment: No/denies pain    Home Living Family/patient expects to be discharged to:: Private residence Living Arrangements: Spouse/significant other Available Help at Discharge: Family;Available 24 hours/day Type of Home: Mobile home Home Access: Stairs to enter Entrance Stairs-Rails: Psychiatric nurse of Steps: 2 Home Layout: One level Home Equipment: Cane - single point      Prior Function Level of Independence: Independent               Hand Dominance        Extremity/Trunk Assessment   Upper Extremity Assessment Upper Extremity Assessment: Defer to OT evaluation    Lower Extremity Assessment Lower Extremity Assessment: Overall WFL for tasks assessed    Cervical /  Trunk Assessment Cervical / Trunk Assessment: Normal  Communication   Communication: No difficulties  Cognition Arousal/Alertness: Awake/alert Behavior During Therapy: WFL for tasks assessed/performed Overall Cognitive Status: Within Functional Limits for tasks assessed                                        General Comments       Exercises     Assessment/Plan    PT Assessment Patent does not need any further PT services  PT Problem List         PT Treatment Interventions      PT Goals (Current goals can be found in the Care Plan section)  Acute Rehab PT Goals Patient Stated Goal: Return home PT Goal Formulation: With patient Time For Goal Achievement: 10/01/20 Potential to Achieve Goals: Good    Frequency     Barriers to discharge        Co-evaluation               AM-PAC PT "6 Clicks" Mobility  Outcome Measure Help needed turning from your back to your side while in a flat bed without using bedrails?: None Help needed moving from lying on your back to sitting on the side of a flat bed without using bedrails?: None Help needed moving to and from a bed to a chair (including a wheelchair)?: None Help needed standing up from a chair using your arms (e.g., wheelchair or bedside chair)?: None Help needed to walk in hospital room?: None Help needed climbing 3-5 steps with a railing? : None 6 Click Score: 24    End of Session   Activity Tolerance: Patient tolerated treatment well Patient left: in bed;with family/visitor present;with nursing/sitter in room;with call bell/phone within reach Nurse Communication: Mobility status PT Visit Diagnosis: Other abnormalities of gait and mobility (R26.89);Other symptoms and signs involving the nervous system (R29.898)    Time: 3435-6861 PT Time Calculation (min) (ACUTE ONLY): 10 min   Charges:   PT Evaluation $PT Eval Low Complexity: 1 Low           10:48 AM, 10/01/20 Mearl Latin PT, DPT Physical Therapist at [facility]

## 2020-10-01 NOTE — Discharge Summary (Addendum)
Physician Discharge Summary  Richard [name] ZCH:[id number] DOB: Mar 03, 1966 DOA: 09/30/2020  PCP: [name], PA-C  Admit date: 09/30/2020 Discharge date: 10/01/2020  Admitted From:  Home  Disposition: Home   Recommendations for Outpatient Follow-up:  1. Follow up with PCP in 1 weeks 2. Follow up with cardiology in 2 weeks 3. Follow up with neurology in 2 weeks     Discharge Condition: STABLE   CODE STATUS: FULL  DIET:  Heart healthy carb modified  Brief Hospitalization Summary: Please see all hospital notes, images, labs for full details of the hospitalization. ADMISSION HPI: Richard [name] is a 55 y.o. male with medical history significant of osteoarthritis, asthma/COPD, unspecified colitis, anxiety/depression, panic attacks, PUD, GERD, hiatal hernia, hepatic asteatosis, hyperlipidemia, hypertension, mild CAD, paroxysmal atrial fibrillation, history of CVA with residual left facial deficit, TIA after CAD in January 2017, tobacco abuse (per patient, down to 2 cigarettes/day), type 2 diabetes mellitus who is coming to the emergency department after having lateral hemianopsia of the right eye followed by fronto-coronal headache associated with generalized weakness and mild ataxia.  The patient stated that he had a 45-minute period of palpitations prior to his vision changes.  He denies focal weakness or numbness, vertigo sensation, nausea, emesis, slurred speech or inability to comprehend language.  No history of fever, chills, sore throat, rhinorrhea, wheezing, dyspnea or hemoptysis.  Denies chest pain, diaphoresis, PND, orthopnea or pitting edema of the lower extremities.  Denies abdominal pain, diarrhea, constipation, melena or hematochezia.  No dysuria, frequency or hematuria.  Denies polyuria, polydipsia, polyphagia or blurred vision.  ED Course: Initial vital signs were temperature 97.7 F, pulse 63, respiration 19, BP 149/72 mmHg O2 sat 99% on room air.  Labwork: Urinalysis shows  small hemoglobinuria, but was otherwise unremarkable.  UDS was negative.  Alcohol level was undetectable.  Coronavirus 2 and influenza PCR was negative.  CBC was normal.  PT/INR/PTT within normal limits.  CMP shows a glucose of 138 mg/dL, all other values are unremarkable when sodium is corrected to glucose and calcium to albumin.  Imaging: CT head and CT a head and neck did not show any acute abnormalities.  Please see images and full radiology report for further detail.    HOSPITAL COURSE  Pt was admitted for observation and management of atypical TIA symptoms.  He had a full stroke work-up including an MRI and MRA with no findings of acute ischemia.  MRV was negative for thrombosis.  Patient also had a 2D echocardiogram with bubble study noted below with no significant findings.  He is feeling better.  His diabetes is well controlled as evidenced by hemoglobin A1c of 6.4%.  His lipids are optimally controlled with an LDL less than 70.  He has been maintained on Xarelto 20 mg daily with supper.  He had some bradycardia and soft BPs on the 75 mg dose of metoprolol and we have backed that down to 50 mg daily.  Close outpatient follow up has been recommended. Pt verbalized understanding.   Pt seen by inpatient neuro and outpatient follow up with neuro has been recommended.  PT evaluated patient and no PT follow up was recommended.  Pt stable to discharge home.  PT STRONGLY ADVISED TO PLEASE STOP ALL TOBACCO USE.    Discharge Diagnoses:  Principal Problem:   TIA (transient ischemic attack) Active Problems:   HLD (hyperlipidemia)   GERD (gastroesophageal reflux disease)   Cigarette smoker   Controlled diabetes mellitus type 2 with complications ([place])  Essential hypertension   Anxiety and depression   Coronary artery disease due to lipid rich plaque   Paroxysmal atrial fibrillation [facility])   Discharge Instructions:  Allergies as of 10/01/2020      Reactions   Dexilant [dexlansoprazole]  Anaphylaxis   Mushroom Ext Cmplx(shiitake-reishi-mait) Anaphylaxis   Rapid heart rate.   Penicillins Anaphylaxis   Has patient had a PCN reaction causing immediate rash, facial/tongue/throat swelling, SOB or lightheadedness with hypotension: Yes Has patient had a PCN reaction causing severe rash involving mucus membranes or skin necrosis: No Has patient had a PCN reaction that required hospitalization Yes Has patient had a PCN reaction occurring within the last 10 years: No If all of the above answers are "NO", then may proceed with Cephalosporin use.   Doxycycline Nausea And Vomiting         Medication List    STOP taking these medications   cetirizine 10 MG tablet Commonly known as: ZyrTEC Allergy   clindamycin 300 MG capsule Commonly known as: Cleocin   HYDROcodone-acetaminophen 5-325 MG tablet Commonly known as: NORCO/VICODIN     TAKE these medications   ALPRAZolam 0.5 MG tablet Commonly known as: XANAX Take one tablet by mouth three times a day and two tablets at night   aluminum-magnesium hydroxide 200-200 MG/5ML suspension Take by mouth every 6 (six) hours as needed for indigestion.   amLODipine 10 MG tablet Commonly known as: NORVASC Take 1 tablet (10 mg total) by mouth daily.   budesonide-formoterol 80-4.5 MCG/ACT inhaler Commonly known as: SYMBICORT Inhale 2 puffs into the lungs 2 (two) times daily as needed.   calcium carbonate 500 MG chewable tablet Commonly known as: TUMS - dosed in mg elemental calcium Chew 1 tablet by mouth daily as needed for indigestion or heartburn.   cyclobenzaprine 10 MG tablet Commonly known as: FLEXERIL Take 1 tablet (10 mg total) by mouth as needed for muscle spasms.   esomeprazole 40 MG capsule Commonly known as: NexIUM Take 1 capsule (40 mg total) by mouth 2 (two) times daily before a meal.   Januvia 100 MG tablet Generic drug: sitaGLIPtin TAKE 1 Tablet BY MOUTH ONCE DAILY What changed: how much to take   meclizine  25 MG tablet Commonly known as: ANTIVERT Take 1 tablet (25 mg total) by mouth 3 (three) times daily as needed for dizziness.   metFORMIN 500 MG tablet Commonly known as: GLUCOPHAGE Take 500 mg by mouth 2 (two) times daily with a meal.   metoprolol succinate 50 MG 24 hr tablet Commonly known as: TOPROL-XL Take 1 tablet (50 mg total) by mouth daily. What changed:   how much to take  how to take this  when to take this  additional instructions   nitroGLYCERIN 0.4 MG SL tablet Commonly known as: NITROSTAT Place 1 tablet (0.4 mg total) under the tongue every 5 (five) minutes x 3 doses as needed for chest pain (if no relief after 3rd dose, proceed to the ED for an evaluation).   Proventil HFA 108 (90 Base) MCG/ACT inhaler Generic drug: albuterol INHALE 2 PUFFS BY MOUTH EVERY 6 HOURS AS NEEDED FOR COUGHING, WHEEZING, OR SHORTNESS OF BREATH   simvastatin 20 MG tablet Commonly known as: ZOCOR TAKE 1 TABLET BY MOUTH AT BEDTIME   sucralfate 1 GM/10ML suspension Commonly known as: CARAFATE Take 10 mLs (1 g total) by mouth 4 (four) times daily.   Xarelto 20 MG Tabs tablet Generic drug: rivaroxaban Take 1 tablet (20 mg total) by mouth daily with  supper.       Follow-up Information    [name], PA-C. Schedule an appointment as soon as possible for a visit in 1 week(s).   Specialty: Physician Assistant Contact information: [address] [place] [address] [phone]        Satira Sark, MD. Schedule an appointment as soon as possible for a visit in 1 month(s).   Specialty: Cardiology Contact information: [place] [address] [phone]        Phillips Odor, MD. Schedule an appointment as soon as possible for a visit in 2 week(s).   Specialty: Neurology Why: Hospital Follow Up  Contact information: [address] [place] [address] [phone]              Allergies  Allergen Reactions  . Dexilant  [Dexlansoprazole] Anaphylaxis  . Mushroom Ext Cmplx(Shiitake-Reishi-Mait) Anaphylaxis    Rapid heart rate.  . Penicillins Anaphylaxis    Has patient had a PCN reaction causing immediate rash, facial/tongue/throat swelling, SOB or lightheadedness with hypotension: Yes Has patient had a PCN reaction causing severe rash involving mucus membranes or skin necrosis: No Has patient had a PCN reaction that required hospitalization Yes Has patient had a PCN reaction occurring within the last 10 years: No If all of the above answers are "NO", then may proceed with Cephalosporin use.   [name] Doxycycline Nausea And Vomiting        Allergies as of 10/01/2020      Reactions   Dexilant [dexlansoprazole] Anaphylaxis   Mushroom Ext Cmplx(shiitake-reishi-mait) Anaphylaxis   Rapid heart rate.   Penicillins Anaphylaxis   Has patient had a PCN reaction causing immediate rash, facial/tongue/throat swelling, SOB or lightheadedness with hypotension: Yes Has patient had a PCN reaction causing severe rash involving mucus membranes or skin necrosis: No Has patient had a PCN reaction that required hospitalization Yes Has patient had a PCN reaction occurring within the last 10 years: No If all of the above answers are "NO", then may proceed with Cephalosporin use.   Doxycycline Nausea And Vomiting         Medication List    STOP taking these medications   cetirizine 10 MG tablet Commonly known as: ZyrTEC Allergy   clindamycin 300 MG capsule Commonly known as: Cleocin   HYDROcodone-acetaminophen 5-325 MG tablet Commonly known as: NORCO/VICODIN     TAKE these medications   ALPRAZolam 0.5 MG tablet Commonly known as: XANAX Take one tablet by mouth three times a day and two tablets at night   aluminum-magnesium hydroxide 200-200 MG/5ML suspension Take by mouth every 6 (six) hours as needed for indigestion.   amLODipine 10 MG tablet Commonly known as: NORVASC Take 1 tablet (10 mg total) by mouth  daily.   budesonide-formoterol 80-4.5 MCG/ACT inhaler Commonly known as: SYMBICORT Inhale 2 puffs into the lungs 2 (two) times daily as needed.   calcium carbonate 500 MG chewable tablet Commonly known as: TUMS - dosed in mg elemental calcium Chew 1 tablet by mouth daily as needed for indigestion or heartburn.   cyclobenzaprine 10 MG tablet Commonly known as: FLEXERIL Take 1 tablet (10 mg total) by mouth as needed for muscle spasms.   esomeprazole 40 MG capsule Commonly known as: NexIUM Take 1 capsule (40 mg total) by mouth 2 (two) times daily before a meal.   Januvia 100 MG tablet Generic drug: sitaGLIPtin TAKE 1 Tablet BY MOUTH ONCE DAILY What changed: how much to take   meclizine 25  MG tablet Commonly known as: ANTIVERT Take 1 tablet (25 mg total) by mouth 3 (three) times daily as needed for dizziness.   metFORMIN 500 MG tablet Commonly known as: GLUCOPHAGE Take 500 mg by mouth 2 (two) times daily with a meal.   metoprolol succinate 50 MG 24 hr tablet Commonly known as: TOPROL-XL Take 1 tablet (50 mg total) by mouth daily. What changed:   how much to take  how to take this  when to take this  additional instructions   nitroGLYCERIN 0.4 MG SL tablet Commonly known as: NITROSTAT Place 1 tablet (0.4 mg total) under the tongue every 5 (five) minutes x 3 doses as needed for chest pain (if no relief after 3rd dose, proceed to the ED for an evaluation).   Proventil HFA 108 (90 Base) MCG/ACT inhaler Generic drug: albuterol INHALE 2 PUFFS BY MOUTH EVERY 6 HOURS AS NEEDED FOR COUGHING, WHEEZING, OR SHORTNESS OF BREATH   simvastatin 20 MG tablet Commonly known as: ZOCOR TAKE 1 TABLET BY MOUTH AT BEDTIME   sucralfate 1 GM/10ML suspension Commonly known as: CARAFATE Take 10 mLs (1 g total) by mouth 4 (four) times daily.   Xarelto 20 MG Tabs tablet Generic drug: rivaroxaban Take 1 tablet (20 mg total) by mouth daily with supper.       Procedures/Studies: CT  Angio Head W or Wo Contrast  Result Date: 09/30/2020 CLINICAL DATA:  Initial evaluation for acute neuro deficit, stroke suspected. EXAM: CT ANGIOGRAPHY HEAD AND NECK TECHNIQUE: Multidetector CT imaging of the head and neck was performed using the standard protocol during bolus administration of intravenous contrast. Multiplanar CT image reconstructions and MIPs were obtained to evaluate the vascular anatomy. Carotid stenosis measurements (when applicable) are obtained utilizing NASCET criteria, using the distal internal carotid diameter as the denominator. CONTRAST:  129mL OMNIPAQUE IOHEXOL 350 MG/ML SOLN COMPARISON:  None available. FINDINGS: CT HEAD FINDINGS Brain: Cerebral volume within normal limits for patient age. No evidence for acute intracranial hemorrhage. No findings to suggest acute large vessel territory infarct. No mass lesion, midline shift, or mass effect. Ventricles are normal in size without evidence for hydrocephalus. No extra-axial fluid collection identified. Vascular: No hyperdense vessel identified. Skull: Scalp soft tissues demonstrate no acute abnormality. Calvarium intact. Sinuses/Orbits: Globes and orbital soft tissues within normal limits. Visualized paranasal sinuses are clear. No mastoid effusion. CTA NECK FINDINGS Aortic arch: Visualized aortic arch of normal caliber with normal branch pattern. No hemodynamically significant stenosis about the origin of the great vessels. Visualized subclavian arteries widely patent. Right carotid system: Right common and internal carotid arteries widely patent without stenosis, dissection or occlusion. Left carotid system: Left common and internal carotid arteries widely patent without stenosis, dissection or occlusion. Vertebral arteries: Both vertebral arteries arise from the subclavian arteries. No proximal subclavian artery stenosis. Both vertebral arteries widely patent without stenosis, dissection or occlusion. Skeleton: No acute osseous  finding. No discrete or worrisome osseous lesions. Poor dentition noted. Other neck: No other acute soft tissue abnormality within the neck. No mass or adenopathy. Upper chest: Visualized upper chest demonstrates no acute finding. Review of the MIP images confirms the above findings CTA HEAD FINDINGS Anterior circulation: Petrous segments widely patent. Minimal for age atheromatous plaque within the carotid siphons without stenosis. A1 segments widely patent. Normal anterior communicating artery complex. Anterior cerebral arteries patent to their distal aspects without stenosis. No M1 stenosis or occlusion. Normal MCA bifurcations. Distal MCA branches well perfused and symmetric. Posterior circulation: Both V4 segments patent  to the vertebrobasilar junction without stenosis. Both PICA origins patent and normal. Basilar widely patent to its distal aspect without stenosis. Superior cerebellar arteries patent bilaterally. Both PCAs primarily supplied via the basilar and are well perfused to there distal aspects. Venous sinuses: Patent. Anatomic variants: None significant.  No aneurysm. Review of the MIP images confirms the above findings IMPRESSION: CT HEAD IMPRESSION: Negative head CT.  No acute intracranial abnormality identified. CTA HEAD AND NECK IMPRESSION: Negative CTA of the head and neck. No large vessel occlusion, hemodynamically significant stenosis, or other acute vascular abnormality. Electronically Signed   By: Jeannine Boga M.D.   On: 09/30/2020 22:17   CT Angio Neck W and/or Wo Contrast  Result Date: 09/30/2020 CLINICAL DATA:  Initial evaluation for acute neuro deficit, stroke suspected. EXAM: CT ANGIOGRAPHY HEAD AND NECK TECHNIQUE: Multidetector CT imaging of the head and neck was performed using the standard protocol during bolus administration of intravenous contrast. Multiplanar CT image reconstructions and MIPs were obtained to evaluate the vascular anatomy. Carotid stenosis measurements  (when applicable) are obtained utilizing NASCET criteria, using the distal internal carotid diameter as the denominator. CONTRAST:  12mL OMNIPAQUE IOHEXOL 350 MG/ML SOLN COMPARISON:  None available. FINDINGS: CT HEAD FINDINGS Brain: Cerebral volume within normal limits for patient age. No evidence for acute intracranial hemorrhage. No findings to suggest acute large vessel territory infarct. No mass lesion, midline shift, or mass effect. Ventricles are normal in size without evidence for hydrocephalus. No extra-axial fluid collection identified. Vascular: No hyperdense vessel identified. Skull: Scalp soft tissues demonstrate no acute abnormality. Calvarium intact. Sinuses/Orbits: Globes and orbital soft tissues within normal limits. Visualized paranasal sinuses are clear. No mastoid effusion. CTA NECK FINDINGS Aortic arch: Visualized aortic arch of normal caliber with normal branch pattern. No hemodynamically significant stenosis about the origin of the great vessels. Visualized subclavian arteries widely patent. Right carotid system: Right common and internal carotid arteries widely patent without stenosis, dissection or occlusion. Left carotid system: Left common and internal carotid arteries widely patent without stenosis, dissection or occlusion. Vertebral arteries: Both vertebral arteries arise from the subclavian arteries. No proximal subclavian artery stenosis. Both vertebral arteries widely patent without stenosis, dissection or occlusion. Skeleton: No acute osseous finding. No discrete or worrisome osseous lesions. Poor dentition noted. Other neck: No other acute soft tissue abnormality within the neck. No mass or adenopathy. Upper chest: Visualized upper chest demonstrates no acute finding. Review of the MIP images confirms the above findings CTA HEAD FINDINGS Anterior circulation: Petrous segments widely patent. Minimal for age atheromatous plaque within the carotid siphons without stenosis. A1 segments  widely patent. Normal anterior communicating artery complex. Anterior cerebral arteries patent to their distal aspects without stenosis. No M1 stenosis or occlusion. Normal MCA bifurcations. Distal MCA branches well perfused and symmetric. Posterior circulation: Both V4 segments patent to the vertebrobasilar junction without stenosis. Both PICA origins patent and normal. Basilar widely patent to its distal aspect without stenosis. Superior cerebellar arteries patent bilaterally. Both PCAs primarily supplied via the basilar and are well perfused to there distal aspects. Venous sinuses: Patent. Anatomic variants: None significant.  No aneurysm. Review of the MIP images confirms the above findings IMPRESSION: CT HEAD IMPRESSION: Negative head CT.  No acute intracranial abnormality identified. CTA HEAD AND NECK IMPRESSION: Negative CTA of the head and neck. No large vessel occlusion, hemodynamically significant stenosis, or other acute vascular abnormality. Electronically Signed   By: Jeannine Boga M.D.   On: 09/30/2020 22:17   MR BRAIN  WO CONTRAST  Result Date: 10/01/2020 CLINICAL DATA:  Vertigo, central headache. EXAM: MRI HEAD WITHOUT CONTRAST MRV HEAD WITHOUT CONTRAST TECHNIQUE: Multiplanar, multiecho pulse sequences of the brain and surrounding structures were obtained without intravenous contrast. Angiographic images of the intracranial venous structures were obtained using MRV technique without intravenous contrast. COMPARISON:  CTA September 30, 2020. MR and CT August 12, 2015. FINDINGS: MRI HEAD WITHOUT CONTRAST Brain: No acute infarction, hemorrhage, hydrocephalus, extra-axial collection or mass lesion. Vascular: Major arterial flow voids are maintained at the skull base. Skull and upper cervical spine: Normal marrow signal. Sinuses/Orbits: Mucosal thickening of frontal sinuses, ethmoid air cells, and maxillary sinuses with suspected small right maxillary sinus retention cyst. Unremarkable orbits.  Other: Trace bilateral mastoid effusions. MR VENOGRAM WITHOUT CONTRAST No convincing evidence of dural sinus thrombosis. The anterior aspect the superior sagittal sinus is non-opacified, but this is favored to represent an atretic rather than acutely thrombosed sinus given small size of the sinus, best seen on recent CTA (likely also small on prior CT head from 2017). Small left transverse sinus, as also seen on recent CTA. Linear filling defects at the torcula and left transverse/sigmoid junction, favored to relate to turbulent flow. IMPRESSION: MRI head: No evidence of acute intracranial abnormality. MRV: No convincing evidence of dural sinus thrombosis. The anterior aspect the superior sagittal sinus is non-opacified, but this is favored to represent an atretic rather than acutely thrombosed sinus given the small size of the sinus, best seen on recent CTA (and likely also small on prior CT head from 2017). Electronically Signed   By: Margaretha Sheffield MD   On: 10/01/2020 08:28   MR Venogram Head  Result Date: 10/01/2020 CLINICAL DATA:  Vertigo, central headache. EXAM: MRI HEAD WITHOUT CONTRAST MRV HEAD WITHOUT CONTRAST TECHNIQUE: Multiplanar, multiecho pulse sequences of the brain and surrounding structures were obtained without intravenous contrast. Angiographic images of the intracranial venous structures were obtained using MRV technique without intravenous contrast. COMPARISON:  CTA September 30, 2020. MR and CT August 12, 2015. FINDINGS: MRI HEAD WITHOUT CONTRAST Brain: No acute infarction, hemorrhage, hydrocephalus, extra-axial collection or mass lesion. Vascular: Major arterial flow voids are maintained at the skull base. Skull and upper cervical spine: Normal marrow signal. Sinuses/Orbits: Mucosal thickening of frontal sinuses, ethmoid air cells, and maxillary sinuses with suspected small right maxillary sinus retention cyst. Unremarkable orbits. Other: Trace bilateral mastoid effusions. MR VENOGRAM  WITHOUT CONTRAST No convincing evidence of dural sinus thrombosis. The anterior aspect the superior sagittal sinus is non-opacified, but this is favored to represent an atretic rather than acutely thrombosed sinus given small size of the sinus, best seen on recent CTA (likely also small on prior CT head from 2017). Small left transverse sinus, as also seen on recent CTA. Linear filling defects at the torcula and left transverse/sigmoid junction, favored to relate to turbulent flow. IMPRESSION: MRI head: No evidence of acute intracranial abnormality. MRV: No convincing evidence of dural sinus thrombosis. The anterior aspect the superior sagittal sinus is non-opacified, but this is favored to represent an atretic rather than acutely thrombosed sinus given the small size of the sinus, best seen on recent CTA (and likely also small on prior CT head from 2017). Electronically Signed   By: Margaretha Sheffield MD   On: 10/01/2020 08:28   US Carotid Bilateral (at [facility] and [facility] only)  Result Date: 10/01/2020 CLINICAL DATA:  Transient ischemic attack symptoms EXAM: BILATERAL CAROTID DUPLEX ULTRASOUND TECHNIQUE: Pearline Cables scale imaging, color Doppler and duplex ultrasound  were performed of bilateral carotid and vertebral arteries in the neck. COMPARISON:  None. FINDINGS: Criteria: Quantification of carotid stenosis is based on velocity parameters that correlate the residual internal carotid diameter with NASCET-based stenosis levels, using the diameter of the distal internal carotid lumen as the denominator for stenosis measurement. The following velocity measurements were obtained: RIGHT ICA: 88/32 cm/sec CCA: 05/69 cm/sec SYSTOLIC ICA/CCA RATIO:  0.9 ECA:  113 cm/sec LEFT ICA: 78/29 cm/sec CCA: 79/48 cm/sec SYSTOLIC ICA/CCA RATIO:  0.8 ECA:  116 cm/sec RIGHT CAROTID ARTERY: Trace smooth heterogeneous atherosclerotic plaque in the proximal internal carotid artery. By peak systolic velocity criteria, the estimated stenosis is less  than 50%. RIGHT VERTEBRAL ARTERY:  Patent with normal antegrade flow. LEFT CAROTID ARTERY: No significant atherosclerotic plaque or evidence of stenosis. LEFT VERTEBRAL ARTERY:  Patent with normal antegrade flow. IMPRESSION: 1. Mild (1-49%) stenosis proximal right internal carotid artery secondary to trace smooth heterogeneous atherosclerotic plaque. 2. No significant atherosclerotic plaque or evidence of stenosis in the left internal carotid artery. 3. Vertebral arteries are patent with normal antegrade flow. Signed, Criselda Peaches, MD, [facility] Electronically Signed   By: Jacqulynn Cadet M.D.   On: 10/01/2020 10:01   ECHOCARDIOGRAM COMPLETE BUBBLE STUDY  Result Date: 10/01/2020    ECHOCARDIOGRAM REPORT   Patient Name:   Richard [name] Date of Exam: 10/01/2020 Medical Rec #:  [id number]      Height:       71.0 in Accession #:    [id number]     Weight:       185.0 lb Date of Birth:  [birth year]      BSA:          2.040 m Patient Age:    47 years       BP:           126/71 mmHg Patient Gender: M              HR:           67 bpm. Exam Location:  [facility] Procedure: 2D Echo, Color Doppler, Cardiac Doppler and Saline Contrast Bubble            Study Indications:    TIA (transient ischemic attack) 435.9 / G45.9  History:        Patient has prior history of Echocardiogram examinations, most                 recent 02/24/2020. CAD, TIA, Stroke and COPD, Arrythmias:Atrial                 Fibrillation; Risk Factors:Diabetes, Hypertension, Dyslipidemia                 and Former Smoker.  Sonographer:    Bernadene Person RDCS Referring Phys: [id number] [place]  1. Left ventricular ejection fraction, by estimation, is 65 to 70%. The left ventricle has normal function. The left ventricle has no regional wall motion abnormalities. Left ventricular diastolic parameters were normal.  2. Right ventricular systolic function is normal. The  right ventricular size is normal. Tricuspid regurgitation signal is inadequate for assessing PA pressure.  3. The mitral valve is grossly normal. No evidence of mitral valve regurgitation.  4. The aortic valve is tricuspid. Aortic valve regurgitation is not visualized.  5. The inferior vena cava is normal in size with <50% respiratory variability, suggesting right atrial pressure of 8 mmHg.  6. Agitated saline  contrast bubble study was negative, with no evidence of any interatrial shunt. FINDINGS  Left Ventricle: Left ventricular ejection fraction, by estimation, is 65 to 70%. The left ventricle has normal function. The left ventricle has no regional wall motion abnormalities. The left ventricular internal cavity size was normal in size. There is  borderline left ventricular hypertrophy. Left ventricular diastolic parameters were normal. Right Ventricle: The right ventricular size is normal. No increase in right ventricular wall thickness. Right ventricular systolic function is normal. Tricuspid regurgitation signal is inadequate for assessing PA pressure. Left Atrium: Left atrial size was normal in size. Right Atrium: Right atrial size was normal in size. Pericardium: There is no evidence of pericardial effusion. Mitral Valve: The mitral valve is grossly normal. No evidence of mitral valve regurgitation. Tricuspid Valve: The tricuspid valve is grossly normal. Tricuspid valve regurgitation is trivial. Aortic Valve: The aortic valve is tricuspid. There is mild to moderate aortic valve annular calcification. Aortic valve regurgitation is not visualized. Pulmonic Valve: The pulmonic valve was grossly normal. Pulmonic valve regurgitation is trivial. Aorta: The aortic root is normal in size and structure. Venous: The inferior vena cava is normal in size with less than 50% respiratory variability, suggesting right atrial pressure of 8 mmHg. IAS/Shunts: No atrial level shunt detected by color flow Doppler. Agitated saline  contrast was given intravenously to evaluate for intracardiac shunting. Agitated saline contrast bubble study was negative, with no evidence of any interatrial shunt.  LEFT VENTRICLE PLAX 2D LVIDd:         4.40 cm  Diastology LVIDs:         2.60 cm  LV e' medial:    8.92 cm/s LV PW:         1.00 cm  LV E/e' medial:  9.9 LV IVS:        1.00 cm  LV e' lateral:   11.10 cm/s LVOT diam:     1.90 cm  LV E/e' lateral: 8.0 LV SV:         86 LV SV Index:   42 LVOT Area:     2.84 cm  RIGHT VENTRICLE RV S prime:     12.90 cm/s TAPSE (M-mode): 2.2 cm LEFT ATRIUM             Index       RIGHT ATRIUM           Index LA diam:        3.50 cm 1.72 cm/m  RA Area:     15.80 cm LA Vol (A2C):   40.3 ml 19.76 ml/m RA Volume:   41.50 ml  20.34 ml/m LA Vol (A4C):   38.5 ml 18.87 ml/m LA Biplane Vol: 39.4 ml 19.32 ml/m  AORTIC VALVE LVOT Vmax:   155.00 cm/s LVOT Vmean:  97.900 cm/s LVOT VTI:    0.302 m  AORTA Ao Root diam: 2.90 cm Ao Asc diam:  2.90 cm MITRAL VALVE MV Area (PHT): 3.21 cm    SHUNTS MV Decel Time: 236 msec    Systemic VTI:  0.30 m MV E velocity: 88.30 cm/s  Systemic Diam: 1.90 cm MV A velocity: 61.80 cm/s MV E/A ratio:  1.43 Rozann Lesches MD Electronically signed by Rozann Lesches MD Signature Date/Time: 10/01/2020/5:14:07 PM    Final      Subjective: Pt reports feeling much better, no recurrence of symptoms.   Discharge Exam: Vitals:   10/01/20 1256 10/01/20 1535  BP: 126/71 136/78  Pulse: (!) 50 (!) 51  Resp: 16 16  Temp: 98.1 F (36.7 C) 98.2 F (36.8 C)  SpO2: 98% 99%   Vitals:   10/01/20 0600 10/01/20 0630 10/01/20 1256 10/01/20 1535  BP: 127/71 126/71 126/71 136/78  Pulse: (!) 51 (!) 58 (!) 50 (!) 51  Resp: 15 17 16 16   Temp:   98.1 F (36.7 C) 98.2 F (36.8 C)  TempSrc:   Oral Oral  SpO2: 94% 94% 98% 99%  Weight:      Height:       General: Pt is alert, awake, not in acute distress Cardiovascular: normal S1/S2 +, no rubs, no gallops Respiratory: CTA bilaterally, no wheezing, no  rhonchi Abdominal: Soft, NT, ND, bowel sounds + Extremities: no edema, no cyanosis Neurological:nonfocal exam.    The results of significant diagnostics from this hospitalization (including imaging, microbiology, ancillary and laboratory) are listed below for reference.     Microbiology: Recent Results (from the past 240 hour(s))  Resp Panel by RT-PCR (Flu A&B, Covid) Nasopharyngeal Swab     Status: None   Collection Time: 09/30/20 10:03 PM   Specimen: Nasopharyngeal Swab; Nasopharyngeal(NP) swabs in vial transport medium  Result Value Ref Range Status   SARS Coronavirus 2 by RT PCR NEGATIVE NEGATIVE Final    Comment: (NOTE) SARS-CoV-2 target nucleic acids are NOT DETECTED.  The SARS-CoV-2 RNA is generally detectable in upper respiratory specimens during the acute phase of infection. The lowest concentration of SARS-CoV-2 viral copies this assay can detect is 138 copies/mL. A negative result does not preclude SARS-Cov-2 infection and should not be used as the sole basis for treatment or other patient management decisions. A negative result may occur with  improper specimen collection/handling, submission of specimen other than nasopharyngeal swab, presence of viral mutation(s) within the areas targeted by this assay, and inadequate number of viral copies(<138 copies/mL). A negative result must be combined with clinical observations, patient history, and epidemiological information. The expected result is Negative.  Fact Sheet for Patients:  EntrepreneurPulse.com.au  Fact Sheet for Healthcare Providers:  IncredibleEmployment.be  This test is no t yet approved or cleared by the [place] FDA and  has been authorized for detection and/or diagnosis of SARS-CoV-2 by FDA under an Emergency Use Authorization (EUA). This EUA will remain  in effect (meaning this test can be used) for the duration of the COVID-19 declaration under Section  564(b)(1) of the Act, 21 U.S.C.section 360bbb-3(b)(1), unless the authorization is terminated  or revoked sooner.       Influenza A by PCR NEGATIVE NEGATIVE Final   Influenza B by PCR NEGATIVE NEGATIVE Final    Comment: (NOTE) The Xpert Xpress SARS-CoV-2/FLU/RSV plus assay is intended as an aid in the diagnosis of influenza from Nasopharyngeal swab specimens and should not be used as a sole basis for treatment. Nasal washings and aspirates are unacceptable for Xpert Xpress SARS-CoV-2/FLU/RSV testing.  Fact Sheet for Patients: EntrepreneurPulse.com.au  Fact Sheet for Healthcare Providers: IncredibleEmployment.be  This test is not yet approved or cleared by the [place] FDA and has been authorized for detection and/or diagnosis of SARS-CoV-2 by FDA under an Emergency Use Authorization (EUA). This EUA will remain in effect (meaning this test can be used) for the duration of the COVID-19 declaration under Section 564(b)(1) of the Act, 21 U.S.C. section 360bbb-3(b)(1), unless the authorization is terminated or revoked.  Performed at [facility], [address] [place], [place] [address]      Labs: BNP (last 3 results) No results for input(s): BNP  in the last 8760 hours. Basic Metabolic Panel: Recent Labs  Lab 09/30/20 2143 09/30/20 2155  NA 134* 138  K 3.6 3.7  CL 101 98  CO2 24  --   GLUCOSE 138* 132*  BUN 15 15  CREATININE 0.98 0.90  CALCIUM 8.8*  --    Liver Function Tests: Recent Labs  Lab 09/30/20 2143  AST 14*  ALT 18  ALKPHOS 49  BILITOT 0.4  PROT 6.8  ALBUMIN 3.8   No results for input(s): LIPASE, AMYLASE in the last 168 hours. No results for input(s): AMMONIA in the last 168 hours. CBC: Recent Labs  Lab 09/30/20 2143 09/30/20 2155  WBC 6.8  --   NEUTROABS 4.9  --   HGB 15.7 15.3  HCT 45.5 45.0  MCV 91.4  --   PLT 183  --    Cardiac Enzymes: No results for input(s): CKTOTAL, CKMB, CKMBINDEX,  TROPONINI in the last 168 hours. BNP: Invalid input(s): POCBNP CBG: Recent Labs  Lab 10/01/20 1154 10/01/20 1622  GLUCAP 113* 139*   D-Dimer No results for input(s): DDIMER in the last 72 hours. Hgb A1c Recent Labs    10/01/20 0353  HGBA1C 6.4*   Lipid Profile Recent Labs    10/01/20 0353  CHOL 112  HDL 30*  LDLCALC 66  TRIG 78  CHOLHDL 3.7   Thyroid function studies Recent Labs    10/01/20 0353  TSH 3.119   Anemia work up No results for input(s): VITAMINB12, FOLATE, FERRITIN, TIBC, IRON, RETICCTPCT in the last 72 hours. Urinalysis    Component Value Date/Time   COLORURINE STRAW (A) 09/30/2020 2203   APPEARANCEUR CLEAR 09/30/2020 2203   LABSPEC 1.015 09/30/2020 2203   PHURINE 7.0 09/30/2020 2203   GLUCOSEU NEGATIVE 09/30/2020 2203   HGBUR SMALL (A) 09/30/2020 2203   BILIRUBINUR NEGATIVE 09/30/2020 2203   KETONESUR NEGATIVE 09/30/2020 2203   PROTEINUR NEGATIVE 09/30/2020 2203   UROBILINOGEN 0.2 10/14/2012 1800   NITRITE NEGATIVE 09/30/2020 2203   LEUKOCYTESUR NEGATIVE 09/30/2020 2203   Sepsis Labs Invalid input(s): PROCALCITONIN,  WBC,  LACTICIDVEN Microbiology Recent Results (from the past 240 hour(s))  Resp Panel by RT-PCR (Flu A&B, Covid) Nasopharyngeal Swab     Status: None   Collection Time: 09/30/20 10:03 PM   Specimen: Nasopharyngeal Swab; Nasopharyngeal(NP) swabs in vial transport medium  Result Value Ref Range Status   SARS Coronavirus 2 by RT PCR NEGATIVE NEGATIVE Final    Comment: (NOTE) SARS-CoV-2 target nucleic acids are NOT DETECTED.  The SARS-CoV-2 RNA is generally detectable in upper respiratory specimens during the acute phase of infection. The lowest concentration of SARS-CoV-2 viral copies this assay can detect is 138 copies/mL. A negative result does not preclude SARS-Cov-2 infection and should not be used as the sole basis for treatment or other patient management decisions. A negative result may occur with  improper specimen  collection/handling, submission of specimen other than nasopharyngeal swab, presence of viral mutation(s) within the areas targeted by this assay, and inadequate number of viral copies(<138 copies/mL). A negative result must be combined with clinical observations, patient history, and epidemiological information. The expected result is Negative.  Fact Sheet for Patients:  EntrepreneurPulse.com.au  Fact Sheet for Healthcare Providers:  IncredibleEmployment.be  This test is no t yet approved or cleared by the [place] FDA and  has been authorized for detection and/or diagnosis of SARS-CoV-2 by FDA under an Emergency Use Authorization (EUA). This EUA will remain  in effect (meaning this test can be  used) for the duration of the COVID-19 declaration under Section 564(b)(1) of the Act, 21 U.S.C.section 360bbb-3(b)(1), unless the authorization is terminated  or revoked sooner.       Influenza A by PCR NEGATIVE NEGATIVE Final   Influenza B by PCR NEGATIVE NEGATIVE Final    Comment: (NOTE) The Xpert Xpress SARS-CoV-2/FLU/RSV plus assay is intended as an aid in the diagnosis of influenza from Nasopharyngeal swab specimens and should not be used as a sole basis for treatment. Nasal washings and aspirates are unacceptable for Xpert Xpress SARS-CoV-2/FLU/RSV testing.  Fact Sheet for Patients: EntrepreneurPulse.com.au  Fact Sheet for Healthcare Providers: IncredibleEmployment.be  This test is not yet approved or cleared by the [place] FDA and has been authorized for detection and/or diagnosis of SARS-CoV-2 by FDA under an Emergency Use Authorization (EUA). This EUA will remain in effect (meaning this test can be used) for the duration of the COVID-19 declaration under Section 564(b)(1) of the Act, 21 U.S.C. section 360bbb-3(b)(1), unless the authorization is terminated or revoked.  Performed at [facility], [address], [place], [place] [address]     Time coordinating discharge:    SIGNED:  Irwin Brakeman, MD  [facility] 10/01/2020, 5:47 PM How to contact the [facility] Attending or Consulting provider [place] or covering provider during after hours [place], for this patient?  1. Check the care team in [facility] and look for a) attending/consulting [facility] provider listed and b) the [facility] team listed 2. Log into [email or web address] and use [facility]'s universal password to access. If you do not have the password, please contact the hospital operator. 3. Locate the [facility] provider you are looking for under [facility] and page to a number that you can be directly reached. 4. If you still have difficulty reaching the provider, please page the [facility] (Director on Call) for the Hospitalists listed on amion for assistance.

## 2020-10-01 NOTE — Progress Notes (Signed)
Pt left unit to go to ultrasound

## 2020-10-01 NOTE — Progress Notes (Signed)
Pt discharged home, Discharge instructions given including new dosage on Metoprolol 50 mg once a day. Pt understands change and to talk to primary MD about picking up prescription. Wife bedside. No further questions at this time. Pt requested to not be wheeled down in wheelchair but to walk.

## 2020-10-07 ENCOUNTER — Ambulatory Visit: Payer: Self-pay | Admitting: Physician Assistant

## 2020-10-07 ENCOUNTER — Encounter: Payer: Self-pay | Admitting: Physician Assistant

## 2020-10-07 VITALS — BP 130/82 | HR 54

## 2020-10-07 DIAGNOSIS — I480 Paroxysmal atrial fibrillation: Secondary | ICD-10-CM

## 2020-10-07 DIAGNOSIS — F17200 Nicotine dependence, unspecified, uncomplicated: Secondary | ICD-10-CM

## 2020-10-07 DIAGNOSIS — I2510 Atherosclerotic heart disease of native coronary artery without angina pectoris: Secondary | ICD-10-CM

## 2020-10-07 DIAGNOSIS — E118 Type 2 diabetes mellitus with unspecified complications: Secondary | ICD-10-CM

## 2020-10-07 DIAGNOSIS — I10 Essential (primary) hypertension: Secondary | ICD-10-CM

## 2020-10-07 DIAGNOSIS — E785 Hyperlipidemia, unspecified: Secondary | ICD-10-CM

## 2020-10-07 DIAGNOSIS — J449 Chronic obstructive pulmonary disease, unspecified: Secondary | ICD-10-CM

## 2020-10-07 DIAGNOSIS — Z8673 Personal history of transient ischemic attack (TIA), and cerebral infarction without residual deficits: Secondary | ICD-10-CM

## 2020-10-07 DIAGNOSIS — G459 Transient cerebral ischemic attack, unspecified: Secondary | ICD-10-CM

## 2020-10-07 NOTE — Progress Notes (Signed)
BP 130/82   Pulse (!) 54    Subjective:    Patient ID: Richard [name], male    DOB: 1966-04-13, 55 y.o.   MRN: [id number]  HPI: Richard [name] is a 55 y.o. male presenting on 10/07/2020 for No chief complaint on file.   HPI   This is a telemedicine appointment due to coronavirus pandemic.  It is via telephone as pt does not have a video enabled device.  I connected with  Richard [name] on 10/07/20 by a video enabled telemedicine application and verified that I am speaking with the correct person using two identifiers.   I discussed the limitations of evaluation and management by telemedicine. The patient expressed understanding and agreed to proceed.  Pt is at home.  Provider is at office.    Appointment today is to follow up from recent hospitalization for TIA.    Pt history of CVA, dyslipidemia, GERD, DM, HTN, anxiety and depression, CAD, PAF, tobacco abuse.  Pt says he Quit smoking and then he went into hospital 3 or 4 days later and wonders if those are related.   He is now smoking about 1 cigarette each day, to help him sleep at night.  Since getting out of the hospital, He still has problems with his eyes.  Besides the eye issues mostly okay.  He wonders if his metoprolol is making his BP or pulse go too low.   He says he doesn't have appointment to follow up with neurologist as recommended because the one he saw while in hospital doesn't accept the [employer] financial assistance.  Labs and other records from recent hospitalization were reviewed.     Relevant past medical, surgical, family and social history reviewed and updated as indicated. Interim medical history since our last visit reviewed. Allergies and medications reviewed and updated.   Current Outpatient Medications:  .  ALPRAZolam (XANAX) 0.5 MG tablet, Take one tablet by mouth three times a day and two tablets at night, Disp: 150 tablet, Rfl: 2 .  aluminum-magnesium hydroxide 200-200 MG/5ML suspension,  Take by mouth every 6 (six) hours as needed for indigestion., Disp: , Rfl:  .  amLODipine (NORVASC) 10 MG tablet, Take 1 tablet (10 mg total) by mouth daily., Disp: 90 tablet, Rfl: 3 .  budesonide-formoterol (SYMBICORT) 80-4.5 MCG/ACT inhaler, Inhale 2 puffs into the lungs 2 (two) times daily as needed. , Disp: , Rfl:  .  esomeprazole (NEXIUM) 40 MG capsule, Take 1 capsule (40 mg total) by mouth 2 (two) times daily before a meal., Disp: 60 capsule, Rfl: 5 .  JANUVIA 100 MG tablet, TAKE 1 Tablet BY MOUTH ONCE DAILY (Patient taking differently: Take 100 mg by mouth daily.), Disp: 90 tablet, Rfl: 1 .  metoprolol succinate (TOPROL-XL) 50 MG 24 hr tablet, Take 1 tablet (50 mg total) by mouth daily., Disp: 135 tablet, Rfl: 3 .  nitroGLYCERIN (NITROSTAT) 0.4 MG SL tablet, Place 1 tablet (0.4 mg total) under the tongue every 5 (five) minutes x 3 doses as needed for chest pain (if no relief after 3rd dose, proceed to the ED for an evaluation)., Disp: 25 tablet, Rfl: 3 .  PROVENTIL HFA 108 (90 Base) MCG/ACT inhaler, INHALE 2 PUFFS BY MOUTH EVERY 6 HOURS AS NEEDED FOR COUGHING, WHEEZING, OR SHORTNESS OF BREATH, Disp: 13.4 g, Rfl: 1 .  simvastatin (ZOCOR) 20 MG tablet, TAKE 1 TABLET BY MOUTH AT BEDTIME (Patient taking differently: Take 20 mg by mouth at bedtime.), Disp: 30 tablet,  Rfl: 6 .  sucralfate (CARAFATE) 1 GM/10ML suspension, Take 10 mLs (1 g total) by mouth 4 (four) times daily., Disp: 420 mL, Rfl: 1 .  XARELTO 20 MG TABS tablet, Take 1 tablet (20 mg total) by mouth daily with supper., Disp: 90 tablet, Rfl: 3 .  calcium carbonate (TUMS - DOSED IN MG ELEMENTAL CALCIUM) 500 MG chewable tablet, Chew 1 tablet by mouth daily as needed for indigestion or heartburn. (Patient not taking: Reported on 10/07/2020), Disp: , Rfl:  .  cyclobenzaprine (FLEXERIL) 10 MG tablet, Take 1 tablet (10 mg total) by mouth as needed for muscle spasms. (Patient not taking: Reported on 10/07/2020), Disp: , Rfl:  .  meclizine (ANTIVERT)  25 MG tablet, Take 1 tablet (25 mg total) by mouth 3 (three) times daily as needed for dizziness. (Patient not taking: Reported on 10/07/2020), Disp: 30 tablet, Rfl: 0 .  metFORMIN (GLUCOPHAGE) 500 MG tablet, Take 500 mg by mouth 2 (two) times daily with a meal. (Patient not taking: Reported on 10/07/2020), Disp: , Rfl:     Review of Systems  Per HPI unless specifically indicated above     Objective:    BP 130/82   Pulse (!) 54   Wt Readings from Last 3 Encounters:  09/30/20 185 lb (83.9 kg)  08/11/20 192 lb (87.1 kg)  07/03/20 193 lb (87.5 kg)    Physical Exam Pulmonary:     Effort: No respiratory distress.     Comments: Pt is talking in complete sentences wtihout sob Neurological:     Mental Status: He is alert and oriented to person, place, and time.  Psychiatric:        Attention and Perception: Attention normal.        Speech: Speech normal.        Behavior: Behavior is cooperative.              Assessment & Plan:    Encounter Diagnoses  Name Primary?  . TIA (transient ischemic attack) Yes  . History of stroke   . Controlled diabetes mellitus type 2 with complications, unspecified whether long term insulin use ([place])   . Essential hypertension   . Hyperlipidemia, unspecified hyperlipidemia type   . Paroxysmal atrial fibrillation (HCC)   . Coronary artery disease involving native heart without angina pectoris, unspecified vessel or lesion type   . Tobacco use disorder   . Chronic obstructive pulmonary disease, unspecified COPD type ([place])      -no medication changes today.  Pt is encouraged to fully stop smoking -pt is scheduled for DM Eye exam 11/17/20 -will Refer to neuroogist in [place] who accepts [employer] financial assistance.   -encouraged pt to Discuss sleep issues and anxiety with his [place] provider. -pt to follow up with Cardiologist in April as scheduled -pt to follow up with Gastroenterologist in June as scheduled -pt is encouraged to get  covid vaccination -pt to follow up here in may as scheduled

## 2020-10-08 ENCOUNTER — Telehealth: Payer: Self-pay | Admitting: Cardiology

## 2020-10-08 NOTE — Telephone Encounter (Signed)
Spoke to patient. Patient states that he is trying to quit smoking. When he does not smoke, his blood pressure drops, but when he does smoke his blood pressure increases. I explained to patient that the nicotine in a cigarette is a big part of the problem. It raises your blood pressure and heart rate. Patient verbalized understanding, but said that he is also having palpitations and would like some advice.   Please advise.

## 2020-10-08 NOTE — Telephone Encounter (Signed)
You are correct.  Nicotine is a stimulant and tends to drive your blood pressure up on your smoking and blood pressure tends to improve when you are not.  Also nicotine can cause increased palpitations.  Make sure he is taking his beta-blocker as directed.  If he continues to have palpitations in spite of taking his beta-blocker, he needs to come in and have a monitor placed.

## 2020-10-08 NOTE — Telephone Encounter (Signed)
Spoke to patient who voiced understanding and will keep a check on hr/bp throughout the weekend and will give our office a call on lMonday to discuss if he needs to come in for monitor placement.

## 2020-10-08 NOTE — Telephone Encounter (Signed)
NEW MESSAGE   Pt c/o BP issue: STAT if pt c/o blurred vision, one-sided weakness or slurred speech  1. What are your last 5 BP readings?  140'S  140/84 THIS MORNING   2. Are you having any other symptoms (ex. Dizziness, headache, blurred vision, passed out)? NO  3. What is your BP issue? PATIENT IS TRYING TO STOP SMOKING - WHEN HE DOESN'T SMOKE HIS BP DROPS , WHEN HE SMOKES IT GOES UP

## 2020-10-11 ENCOUNTER — Telehealth: Payer: Self-pay

## 2020-10-11 DIAGNOSIS — Z79899 Other long term (current) drug therapy: Secondary | ICD-10-CM

## 2020-10-11 MED ORDER — LISINOPRIL 10 MG PO TABS: 10.0000 mg | ORAL_TABLET | Freq: Every day | ORAL | 3 refills | Status: DC

## 2020-10-11 NOTE — Telephone Encounter (Signed)
The most likely reason they decreased his Toprol was because his heart rate was slow.  Ask him if he is willing to start taking a medication called lisinopril 10 mg daily.  Tell him after he starts the medication he needs to start checking his blood pressure at least once a day for 2 weeks.  After 2 weeks come in for nursing visit and bring the measured blood pressure measurements with him and he needs a basic metabolic panel and magnesium at that time.  We cannot adjust the Toprol because his heart rate is already slow.  Thank you

## 2020-10-11 NOTE — Telephone Encounter (Signed)
Contacted patient and spouse who verbalized understanding. Patient has a Nursing visit in the [place] office on October 25, 2020 T 4 pm.

## 2020-10-11 NOTE — Telephone Encounter (Signed)
Patients spouse called this morning and stated that patient's blood pressure was still running high after this weekend. She states that patient wanted to know should he still be taking 75 mg of Metoprolol Succinate. Patients spouse states that he when he had his last encounter at the ED, they decreased his medication to 50 mg and he feels like this is part of the problem.  He only wrote his bp down for Friday  140/84 hr 55.

## 2020-10-25 ENCOUNTER — Ambulatory Visit: Payer: Self-pay

## 2020-10-26 ENCOUNTER — Ambulatory Visit (INDEPENDENT_AMBULATORY_CARE_PROVIDER_SITE_OTHER): Payer: Self-pay | Admitting: Orthopaedic Surgery

## 2020-10-26 ENCOUNTER — Other Ambulatory Visit: Payer: Self-pay

## 2020-10-26 ENCOUNTER — Encounter: Payer: Self-pay | Admitting: Orthopaedic Surgery

## 2020-10-26 VITALS — BP 114/69 | HR 53 | Temp 97.3°F | Resp 18 | Wt 191.6 lb

## 2020-10-26 DIAGNOSIS — F17210 Nicotine dependence, cigarettes, uncomplicated: Secondary | ICD-10-CM

## 2020-10-26 DIAGNOSIS — M25512 Pain in left shoulder: Secondary | ICD-10-CM

## 2020-10-26 DIAGNOSIS — G8929 Other chronic pain: Secondary | ICD-10-CM

## 2020-10-26 DIAGNOSIS — Z9229 Personal history of other drug therapy: Secondary | ICD-10-CM

## 2020-10-26 NOTE — Progress Notes (Signed)
Patient Richard [name], male DOB:September 07, 1965, 55 y.o. WIO:[id number]  Chief Complaint  Patient presents with  . Results    MRI of shoulder.  . Fall    Had in Feb-2022 Getting out shoulder, and back in a garden tub. Hurt so severely. Hurt kidney, tailbone also.    HPI  Richard [name] is a 55 y.o. male who has left shoulder pain.  He had MRI which showed:  IMPRESSION: Moderate acromioclavicular and mild glenohumeral osteoarthritis. Fraying of the superior labrum noted.  Intact and normal appearing rotator cuff and long head of biceps.  I have explained the findings to him.    I have independently reviewed the MRI.     There was a delay in his returning here.  He has had vision problems and had a TIA.  He was hospitalized and is much better now.  He is on anti-coagulation.     Body mass index is 26.72 kg/m.  ROS  Review of Systems  Constitutional: Positive for activity change.  Respiratory: Positive for shortness of breath.   Cardiovascular: Positive for chest pain and palpitations.  Musculoskeletal: Positive for arthralgias, joint swelling and myalgias.  All other systems reviewed and are negative.   All other systems reviewed and are negative.  The following is a summary of the past history medically, past history surgically, known current medicines, social history and family history.  This information is gathered electronically by the computer from prior information and documentation.  I review this each visit and have found including this information at this point in the chart is beneficial and informative.    Past Medical History:  Diagnosis Date  . Arthritis    "legs, spine, shoulders" (08/12/2015)  . Asthma   . Colitis 1990  . COPD (chronic obstructive pulmonary disease) ([place])   . Depression   . Gastric ulcer 2003; 2012   2003: + esophagitis; negative H.pylori serology  2012: Dr. Oneida Alar, mild gastritis, Bravo PH probe placement, negative H.pylori  . GERD  (gastroesophageal reflux disease)   . Hepatic steatosis   . History of hiatal hernia   . Hyperlipemia   . Hypertension   . Mild CAD    a. Cardiac cath 07/2015 showed 65% distal Cx, 20% mid-distal LAD, 20% prox-distal RCA, EF 60%, EDP 65mmHg.  Richard [name] Overweight   . Panic attacks   . Paroxysmal atrial fibrillation (HCC)   . Stroke [facility])    mucsle in left side of face dosent work as its supposed to-only deficit.  Richard [name] TIA (transient ischemic attack)    after cath 1/ 17  . Tobacco abuse    1/2 pack per day  . Type II diabetes mellitus ([place])     Past Surgical History:  Procedure Laterality Date  . BRAVO [place] STUDY  05/03/2011   EQA:STMH gastritis/normal esophagus and duodenum  . CARDIAC CATHETERIZATION  1990s X 1; 2005; 08/12/2015  . CARDIAC CATHETERIZATION N/A 08/12/2015   Procedure: Left Heart Cath and Coronary Angiography;  Surgeon: Belva Crome, MD; LAD 20%, CFX 65%, RCA 20%, EF 60%   . COLONOSCOPY  1990  . COLONOSCOPY WITH PROPOFOL N/A 11/21/2016   Dr. Oneida Alar: non-thrombosed external hemorrhoids, one 6 mm polyp (polypoid lesion), internal hemorrhoids. TI Normal. 10 years screening  . ESOPHAGOGASTRODUODENOSCOPY  05/03/2011   DQQ:IWLN gastritis  . NECK MASS EXCISION Right    "done in dr's office; behind right ear/side of ncek"  . POLYPECTOMY  11/21/2016   Procedure: POLYPECTOMY;  Surgeon: [name], MD;  [facility];  Service: Endoscopy;;  descending colon polyp  . SHOULDER ARTHROSCOPY W/ ROTATOR CUFF REPAIR Right 2006   acromioclavicular joint arthrosis    Family History  Problem Relation Age of Onset  . Lung cancer Mother   . Alcohol abuse Mother   . Heart attack Father 29  . Diabetes Father   . Alcohol abuse Father   . Hypertension Brother   . Hypertension Brother   . Anxiety disorder Sister   . Depression Sister   . Anxiety disorder Sister   . Heart attack Brother 32  . Diabetes Brother   . Hypertension Brother   . Seizures Brother   . Dementia Paternal  Uncle   . Dementia Cousin   . ADD / ADHD Daughter   . Colon cancer Neg Hx   . Drug abuse Neg Hx   . Bipolar disorder Neg Hx   . OCD Neg Hx   . Paranoid behavior Neg Hx   . Schizophrenia Neg Hx   . Sexual abuse Neg Hx   . Physical abuse Neg Hx     Social History Social History   Tobacco Use  . Smoking status: Current Every Day Smoker    Packs/day: 0.25    Years: 25.00    Pack years: 6.25    Types: Cigarettes    Start date: 07/17/1982  . Smokeless tobacco: Never Used  Vaping Use  . Vaping Use: Never used  Substance Use Topics  . Alcohol use: No    Alcohol/week: 0.0 standard drinks  . Drug use: No    Allergies  Allergen Reactions  . Dexilant [Dexlansoprazole] Anaphylaxis  . Mushroom Ext Cmplx(Shiitake-Reishi-Mait) Anaphylaxis    Rapid heart rate.  . Penicillins Anaphylaxis    Has patient had a PCN reaction causing immediate rash, facial/tongue/throat swelling, SOB or lightheadedness with hypotension: Yes Has patient had a PCN reaction causing severe rash involving mucus membranes or skin necrosis: No Has patient had a PCN reaction that required hospitalization Yes Has patient had a PCN reaction occurring within the last 10 years: No If all of the above answers are "NO", then may proceed with Cephalosporin use.   Richard [name] Doxycycline Nausea And Vomiting         Current Outpatient Medications  Medication Sig Dispense Refill  . ALPRAZolam (XANAX) 0.5 MG tablet Take one tablet by mouth three times a day and two tablets at night 150 tablet 2  . aluminum-magnesium hydroxide 200-200 MG/5ML suspension Take by mouth every 6 (six) hours as needed for indigestion.    Richard [name] amLODipine (NORVASC) 10 MG tablet Take 1 tablet (10 mg total) by mouth daily. 90 tablet 3  . calcium carbonate (TUMS - DOSED IN MG ELEMENTAL CALCIUM) 500 MG chewable tablet Chew 1 tablet by mouth daily as needed for indigestion or heartburn.    . cyclobenzaprine (FLEXERIL) 10 MG tablet Take 1 tablet (10 mg total) by  mouth as needed for muscle spasms.    Richard [name] esomeprazole (NEXIUM) 40 MG capsule Take 1 capsule (40 mg total) by mouth 2 (two) times daily before a meal. 60 capsule 5  . JANUVIA 100 MG tablet TAKE 1 Tablet BY MOUTH ONCE DAILY (Patient taking differently: Take 100 mg by mouth daily.) 90 tablet 1  . lisinopril (ZESTRIL) 10 MG tablet Take 1 tablet (10 mg total) by mouth daily. 90 tablet 3  . metoprolol succinate (TOPROL-XL) 50 MG 24 hr tablet Take 1 tablet (50 mg total) by mouth daily. 135 tablet 3  .  nitroGLYCERIN (NITROSTAT) 0.4 MG SL tablet Place 1 tablet (0.4 mg total) under the tongue every 5 (five) minutes x 3 doses as needed for chest pain (if no relief after 3rd dose, proceed to the ED for an evaluation). 25 tablet 3  . PROVENTIL HFA 108 (90 Base) MCG/ACT inhaler INHALE 2 PUFFS BY MOUTH EVERY 6 HOURS AS NEEDED FOR COUGHING, WHEEZING, OR SHORTNESS OF BREATH 13.4 g 1  . simvastatin (ZOCOR) 20 MG tablet TAKE 1 TABLET BY MOUTH AT BEDTIME (Patient taking differently: Take 20 mg by mouth at bedtime.) 30 tablet 6  . XARELTO 20 MG TABS tablet Take 1 tablet (20 mg total) by mouth daily with supper. 90 tablet 3  . budesonide-formoterol (SYMBICORT) 80-4.5 MCG/ACT inhaler Inhale 2 puffs into the lungs 2 (two) times daily as needed.  (Patient not taking: Reported on 10/26/2020)    . meclizine (ANTIVERT) 25 MG tablet Take 1 tablet (25 mg total) by mouth 3 (three) times daily as needed for dizziness. (Patient not taking: Reported on 10/26/2020) 30 tablet 0   No current facility-administered medications for this visit.     Physical Exam  Blood pressure 114/69, pulse (!) 53, temperature (!) 97.3 F (36.3 C), temperature source Temporal, resp. rate 18, weight 191 lb 9.6 oz (86.9 kg), SpO2 99 %.  Constitutional: overall normal hygiene, normal nutrition, well developed, normal grooming, normal body habitus. Assistive device:none  Musculoskeletal: gait and station Limp none, muscle tone and strength are normal, no  tremors or atrophy is present.  .  Neurological: coordination overall normal.  Deep tendon reflex/nerve stretch intact.  Sensation normal.  Cranial nerves II-XII intact.   Skin:   Normal overall no scars, lesions, ulcers or rashes. No psoriasis.  Psychiatric: Alert and oriented x 3.  Recent memory intact, remote memory unclear.  Normal mood and affect. Well groomed.  Good eye contact.  Cardiovascular: overall no swelling, no varicosities, no edema bilaterally, normal temperatures of the legs and arms, no clubbing, cyanosis and good capillary refill.  Lymphatic: palpation is normal.  Left shoulder with good ROM and pain in the extremes.   All other systems reviewed and are negative   The patient has been educated about the nature of the problem(s) and counseled on treatment options.  The patient appeared to understand what I have discussed and is in agreement with it.  Encounter Diagnoses  Name Primary?  . Chronic left shoulder pain Yes  . Nicotine dependence, cigarettes, uncomplicated   . HX: anticoagulation     PLAN Call if any problems.  Precautions discussed.  Continue current medications.   Return to clinic 3 weeks   Begin OT for the shoulder.  Electronically Signed Sanjuana Kava, MD 4/12/202211:04 AM

## 2020-10-29 ENCOUNTER — Other Ambulatory Visit (HOSPITAL_COMMUNITY)
Admission: RE | Admit: 2020-10-29 | Discharge: 2020-10-29 | Disposition: A | Discharge status: Home / Self Care | Payer: Self-pay | Source: Ambulatory Visit | Attending: Family Medicine | Admitting: Family Medicine

## 2020-10-29 ENCOUNTER — Other Ambulatory Visit: Payer: Self-pay

## 2020-10-29 DIAGNOSIS — Z79899 Other long term (current) drug therapy: Secondary | ICD-10-CM | POA: Insufficient documentation

## 2020-10-29 LAB — BASIC METABOLIC PANEL
Anion gap: 10 (ref 5–15)
BUN: 15 mg/dL (ref 6–20)
CO2: 23 mmol/L (ref 22–32)
Calcium: 8.7 mg/dL — ABNORMAL LOW (ref 8.9–10.3)
Chloride: 105 mmol/L (ref 98–111)
Creatinine, Ser: 1.01 mg/dL (ref 0.61–1.24)
GFR, Estimated: >60 mL/min (ref >60)
Glucose, Bld: 150 mg/dL — ABNORMAL HIGH (ref 70–99)
Potassium: 3.6 mmol/L (ref 3.5–5.1)
Sodium: 138 mmol/L (ref 135–145)

## 2020-10-29 LAB — MAGNESIUM: Magnesium: 2 mg/dL (ref 1.7–2.4)

## 2020-11-01 ENCOUNTER — Ambulatory Visit (HOSPITAL_COMMUNITY): Payer: Self-pay | Attending: Orthopaedic Surgery

## 2020-11-01 ENCOUNTER — Other Ambulatory Visit: Payer: Self-pay

## 2020-11-01 ENCOUNTER — Encounter: Payer: Self-pay | Admitting: Cardiology

## 2020-11-01 ENCOUNTER — Telehealth: Payer: Self-pay | Admitting: *Deleted

## 2020-11-01 ENCOUNTER — Encounter (HOSPITAL_COMMUNITY): Payer: Self-pay

## 2020-11-01 DIAGNOSIS — G8929 Other chronic pain: Secondary | ICD-10-CM

## 2020-11-01 DIAGNOSIS — R29898 Other symptoms and signs involving the musculoskeletal system: Secondary | ICD-10-CM

## 2020-11-01 DIAGNOSIS — M25612 Stiffness of left shoulder, not elsewhere classified: Secondary | ICD-10-CM

## 2020-11-01 DIAGNOSIS — M25512 Pain in left shoulder: Secondary | ICD-10-CM | POA: Insufficient documentation

## 2020-11-01 NOTE — Telephone Encounter (Signed)
Laurine Blazer, LPN  5/95/6387 5:64 PM EDT Back to Top     Patient notified via detailed voice message. Copy to pcp. OV scheduled for tomorrow with [name]

## 2020-11-01 NOTE — Progress Notes (Signed)
Cardiology Office Note  Date: 11/02/2020   ID: [name], [name] 08-01-1965, MRN [id number]  PCP:  [name], PA-C  Cardiologist:  Rozann Lesches, MD Electrophysiologist:  None   Chief Complaint  Patient presents with  . Cardiac follow-up    History of Present Illness: Richard [name] is a 55 y.o. male former patient of Dr. Bronson Ing now presenting to establish follow-up with me.  I reviewed his records and updated the chart.  He was last seen in the office in October 2021 by Mr. Cleaver NP.  He is here today with his wife.  Records indicate hospitalization in March with suspected TIA.  Brain MRI/MRI did not reveal any acute findings.  LVEF was normal by echocardiography with negative bubble study.  Carotid Dopplers did not reveal any obstructive stenoses.  Beta-blocker dose was reduced in the setting of bradycardia.  He tells me that he has had a feeling of dizziness since being on lisinopril 10 mg daily, most recent addition to his medications.  He held the medication for the last few days, blood pressure today is normal range.  He reports intermittent sense of palpitations, presumably his atrial fibrillation.  He states that he has been compliant with Xarelto.  I reviewed his recent lab work.  Past Medical History:  Diagnosis Date  . Arthritis   . Asthma   . CAD (coronary artery disease)    a. Cardiac cath 07/2015 showed 65% distal Cx, 20% mid-distal LAD, 20% prox-distal RCA, EF 60%, EDP 21mmHg.  . Colitis 1990  . COPD (chronic obstructive pulmonary disease) ([place])   . Depression   . Essential hypertension   . Gastric ulcer 2003; 2012   2003: + esophagitis; negative H.pylori serology  2012: Dr. Oneida Alar, mild gastritis, Bravo PH probe placement, negative H.pylori  . GERD (gastroesophageal reflux disease)   . Hepatic steatosis   . History of hiatal hernia   . Hyperlipemia   . Overweight   . Panic attacks   . Paroxysmal atrial fibrillation (HCC)   . Stroke ([place])    . TIA (transient ischemic attack)   . Type II diabetes mellitus ([place])     Past Surgical History:  Procedure Laterality Date  . BRAVO [place] STUDY  05/03/2011   DEY:CXKG gastritis/normal esophagus and duodenum  . CARDIAC CATHETERIZATION  1990s X 1; 2005; 08/12/2015  . CARDIAC CATHETERIZATION N/A 08/12/2015   Procedure: Left Heart Cath and Coronary Angiography;  Surgeon: Belva Crome, MD; LAD 20%, CFX 65%, RCA 20%, EF 60%   . COLONOSCOPY  1990  . COLONOSCOPY WITH PROPOFOL N/A 11/21/2016   Dr. Oneida Alar: non-thrombosed external hemorrhoids, one 6 mm polyp (polypoid lesion), internal hemorrhoids. TI Normal. 10 years screening  . ESOPHAGOGASTRODUODENOSCOPY  05/03/2011   YJE:HUDJ gastritis  . NECK MASS EXCISION Right    "done in dr's office; behind right ear/side of ncek"  . POLYPECTOMY  11/21/2016   Procedure: POLYPECTOMY;  Surgeon: [name], MD;  [facility];  Service: Endoscopy;;  descending colon polyp  . SHOULDER ARTHROSCOPY W/ ROTATOR CUFF REPAIR Right 2006   acromioclavicular joint arthrosis    Current Outpatient Medications  Medication Sig Dispense Refill  . ALPRAZolam (XANAX) 0.5 MG tablet Take one tablet by mouth three times a day and two tablets at night 150 tablet 2  . aluminum-magnesium hydroxide 200-200 MG/5ML suspension Take by mouth every 6 (six) hours as needed for indigestion.    [name] amLODipine (NORVASC) 10 MG tablet Take 1 tablet (  10 mg total) by mouth daily. 90 tablet 3  . budesonide-formoterol (SYMBICORT) 80-4.5 MCG/ACT inhaler Inhale 2 puffs into the lungs 2 (two) times daily as needed.    . calcium carbonate (TUMS - DOSED IN MG ELEMENTAL CALCIUM) 500 MG chewable tablet Chew 1 tablet by mouth daily as needed for indigestion or heartburn.    . cyclobenzaprine (FLEXERIL) 10 MG tablet Take 1 tablet (10 mg total) by mouth as needed for muscle spasms.    [name] esomeprazole (NEXIUM) 40 MG capsule Take 1 capsule (40 mg total) by mouth 2 (two) times daily before a meal. 60  capsule 5  . JANUVIA 100 MG tablet TAKE 1 Tablet BY MOUTH ONCE DAILY (Patient taking differently: Take 100 mg by mouth daily.) 90 tablet 1  . metoprolol succinate (TOPROL-XL) 50 MG 24 hr tablet Take 1 tablet (50 mg total) by mouth daily. 135 tablet 3  . nitroGLYCERIN (NITROSTAT) 0.4 MG SL tablet Place 1 tablet (0.4 mg total) under the tongue every 5 (five) minutes x 3 doses as needed for chest pain (if no relief after 3rd dose, proceed to the ED for an evaluation). 25 tablet 3  . PROVENTIL HFA 108 (90 Base) MCG/ACT inhaler INHALE 2 PUFFS BY MOUTH EVERY 6 HOURS AS NEEDED FOR COUGHING, WHEEZING, OR SHORTNESS OF BREATH 13.4 g 1  . simvastatin (ZOCOR) 20 MG tablet TAKE 1 TABLET BY MOUTH AT BEDTIME (Patient taking differently: Take 20 mg by mouth at bedtime.) 30 tablet 6  . XARELTO 20 MG TABS tablet Take 1 tablet (20 mg total) by mouth daily with supper. 90 tablet 3  . lisinopril (ZESTRIL) 5 MG tablet Take 1 tablet (5 mg total) by mouth daily. 90 tablet 3   No current facility-administered medications for this visit.   Allergies:  Dexilant [dexlansoprazole], Mushroom ext cmplx(shiitake-reishi-mait), Penicillins, and Doxycycline   ROS: No frank syncope.  Physical Exam: VS:  BP 118/62   Pulse (!) 54   Ht 5\' 11"  (1.803 m)   Wt 174 lb (78.9 kg)   SpO2 95%   BMI 24.27 kg/m , BMI Body mass index is 24.27 kg/m.  Wt Readings from Last 3 Encounters:  11/02/20 174 lb (78.9 kg)  10/26/20 191 lb 9.6 oz (86.9 kg)  09/30/20 185 lb (83.9 kg)    General: Patient appears comfortable at rest. HEENT: Conjunctiva and lids normal, wearing a mask. Neck: Supple, no elevated JVP or carotid bruits, no thyromegaly. Lungs: Clear to auscultation, nonlabored breathing at rest. Cardiac: Regular rate and rhythm, no S3 or significant systolic murmur, no pericardial rub. Extremities: No pitting edema.  ECG:  An ECG dated 09/30/2020 was personally reviewed today and demonstrated:  Sinus bradycardia.  Recent  Labwork: 09/30/2020: ALT 18; AST 14; Hemoglobin 15.3; Platelets 183 10/01/2020: TSH 3.119 10/29/2020: BUN 15; Creatinine, Ser 1.01; Magnesium 2.0; Potassium 3.6; Sodium 138     Component Value Date/Time   CHOL 112 10/01/2020 0353   TRIG 78 10/01/2020 0353   HDL 30 (L) 10/01/2020 0353   CHOLHDL 3.7 10/01/2020 0353   VLDL 16 10/01/2020 0353   LDLCALC 66 10/01/2020 0353    Other Studies Reviewed Today:  Carotid Dopplers 10/01/2020: IMPRESSION: 1. Mild (1-49%) stenosis proximal right internal carotid artery secondary to trace smooth heterogeneous atherosclerotic plaque. 2. No significant atherosclerotic plaque or evidence of stenosis in the left internal carotid artery. 3. Vertebral arteries are patent with normal antegrade flow.  Echocardiogram 10/01/2020: 1. Left ventricular ejection fraction, by estimation, is 65 to 70%. The  left ventricle has normal function. The left ventricle has no regional  wall motion abnormalities. Left ventricular diastolic parameters were  normal.  2. Right ventricular systolic function is normal. The right ventricular  size is normal. Tricuspid regurgitation signal is inadequate for assessing  PA pressure.  3. The mitral valve is grossly normal. No evidence of mitral valve  regurgitation.  4. The aortic valve is tricuspid. Aortic valve regurgitation is not  visualized.  5. The inferior vena cava is normal in size with <50% respiratory  variability, suggesting right atrial pressure of 8 mmHg.  6. Agitated saline contrast bubble study was negative, with no evidence  of any interatrial shunt.   Assessment and Plan:  1.  Paroxysmal atrial fibrillation with CHA2DS2-VASc score of 5.  Heart rate is regular today, recent ECG reviewed showing sinus bradycardia. He does report intermittent palpitations.  Continue current dose of Toprol-XL, also on Xarelto for stroke prophylaxis.  I reviewed his recent lab work.  2.  Nonobstructive CAD by cardiac  catheterization in 2017.  No active angina symptoms.  LVEF vigorous at 65 to 70% by recent echocardiogram.  Continue statin therapy.  3.  Mixed hyperlipidemia, on Zocor.  Recent LDL 66.  4.  Essential hypertension, currently on Norvasc, Toprol-XL, and recent addition of lisinopril which will be cut back to 5 mg daily.  5.  History of recent TIA per chart review.  Imaging studies were reassuring.  Medication Adjustments/Labs and Tests Ordered: Current medicines are reviewed at length with the patient today.  Concerns regarding medicines are outlined above.   Tests Ordered: No orders of the defined types were placed in this encounter.   Medication Changes: Meds ordered this encounter  Medications  . DISCONTD: lisinopril (ZESTRIL) 5 MG tablet    Sig: Take 1 tablet (5 mg total) by mouth daily.    Dispense:  90 tablet    Refill:  3    11/02/2020 dose decrease  . lisinopril (ZESTRIL) 5 MG tablet    Sig: Take 1 tablet (5 mg total) by mouth daily.    Dispense:  90 tablet    Refill:  3    11/02/2020 dose decrease    Disposition:  Follow up 6 months in the [place] office.  Signed, Satira Sark, MD, [facility] 11/02/2020 9:34 AM    [facility] [address], [place], [place] [address] Phone: [phone]; Fax: [phone]

## 2020-11-01 NOTE — Telephone Encounter (Signed)
-----   Message from Verta Ellen., NP sent at 10/29/2020  1:13 PM EDT ----- Labs looked okay except for blood sugar was little elevated at 150.  Calcium was just slightly low.

## 2020-11-01 NOTE — Patient Instructions (Signed)
1) Seated Row   Sit up straight with elbows by your sides. Pull back with shoulders/elbows, keeping forearms straight, as if pulling back on the reins of a horse. Squeeze shoulder blades together. Repeat _10-15__times, __2-3__sets/day        1) SHOULDER: Flexion On Table   Place hands on towel placed on table, elbows straight. Lean forward with you upper body, pushing towel away from body.  _10-12__ reps per set, __1-2_ sets per day  2) Abduction (Passive)   With arm out to side, resting on towel placed on table with palm DOWN, keeping trunk away from table, lean to the side while pushing towel away from body.  Repeat _10-12___ times. Do _1-2___ sessions per day.  Copyright  VHI. All rights reserved.     3) Internal Rotation (Assistive)   Seated with elbow bent at right angle and held against side, slide arm on table surface in an inward arc keeping elbow anchored in place. Repeat ___10-12_ times. Do _1-2___ sessions per day. Activity: Use this motion to brush crumbs off the table.  Copyright  VHI. All rights reserved.

## 2020-11-02 ENCOUNTER — Ambulatory Visit (HOSPITAL_COMMUNITY): Payer: Self-pay | Admitting: Occupational Therapy

## 2020-11-02 ENCOUNTER — Encounter: Payer: Self-pay | Admitting: Cardiology

## 2020-11-02 ENCOUNTER — Ambulatory Visit (INDEPENDENT_AMBULATORY_CARE_PROVIDER_SITE_OTHER): Payer: Self-pay | Admitting: Cardiology

## 2020-11-02 VITALS — BP 118/62 | HR 54 | Ht 71.0 in | Wt 174.0 lb

## 2020-11-02 DIAGNOSIS — E782 Mixed hyperlipidemia: Secondary | ICD-10-CM

## 2020-11-02 DIAGNOSIS — I25119 Atherosclerotic heart disease of native coronary artery with unspecified angina pectoris: Secondary | ICD-10-CM

## 2020-11-02 DIAGNOSIS — I480 Paroxysmal atrial fibrillation: Secondary | ICD-10-CM

## 2020-11-02 MED ORDER — LISINOPRIL 5 MG PO TABS: 5.0000 mg | ORAL_TABLET | Freq: Every day | ORAL | 3 refills | Status: DC

## 2020-11-02 NOTE — Patient Instructions (Addendum)
Medication Instructions:   Your physician has recommended you make the following change in your medication:   Decrease lisinopril to 5 mg by mouth daily  Continue other medications the same  Labwork:  none  Testing/Procedures:  none  Follow-Up:  Your physician recommends that you schedule a follow-up appointment in: 6 months at the [place] office.  Any Other Special Instructions Will Be Listed Below (If Applicable).  If you need a refill on your cardiac medications before your next appointment, please call your pharmacy.

## 2020-11-02 NOTE — Therapy (Signed)
[place], [place], [address] Phone: [phone]   Fax:  [phone]  Occupational Therapy Evaluation  Patient Details  Name: Richard [name] MRN: [id number] Date of Birth: 09-24-1965 Referring Provider (OT): Sanjuana Kava, MD   Encounter Date: 11/01/2020   OT End of Session - 11/02/20 0959    Visit Number 1    Number of Visits 12    Date for OT Re-Evaluation 12/13/20    Authorization Type CAFA (10/04/20-04/06/21) 100% financial coverage    OT Start Time 1645    OT Stop Time 1730    OT Time Calculation (min) 45 min    Activity Tolerance Patient tolerated treatment well;Patient limited by pain    Behavior During Therapy [facility] for tasks assessed/performed           Past Medical History:  Diagnosis Date  . Arthritis   . Asthma   . CAD (coronary artery disease)    a. Cardiac cath 07/2015 showed 65% distal Cx, 20% mid-distal LAD, 20% prox-distal RCA, EF 60%, EDP 6mmHg.  . Colitis 1990  . COPD (chronic obstructive pulmonary disease) ([place])   . Depression   . Essential hypertension   . Gastric ulcer 2003; 2012   2003: + esophagitis; negative H.pylori serology  2012: Dr. Oneida Alar, mild gastritis, Bravo PH probe placement, negative H.pylori  . GERD (gastroesophageal reflux disease)   . Hepatic steatosis   . History of hiatal hernia   . Hyperlipemia   . Overweight   . Panic attacks   . Paroxysmal atrial fibrillation (HCC)   . Stroke ([place])   . TIA (transient ischemic attack)   . Type II diabetes mellitus ([place])     Past Surgical History:  Procedure Laterality Date  . BRAVO [place] STUDY  05/03/2011   BLT:JQZE gastritis/normal esophagus and duodenum  . CARDIAC CATHETERIZATION  1990s X 1; 2005; 08/12/2015  . CARDIAC CATHETERIZATION N/A 08/12/2015   Procedure: Left Heart Cath and Coronary Angiography;  Surgeon: Belva Crome, MD; LAD 20%, CFX 65%, RCA 20%, EF 60%   . COLONOSCOPY  1990  . COLONOSCOPY WITH PROPOFOL N/A  11/21/2016   Dr. Oneida Alar: non-thrombosed external hemorrhoids, one 6 mm polyp (polypoid lesion), internal hemorrhoids. TI Normal. 10 years screening  . ESOPHAGOGASTRODUODENOSCOPY  05/03/2011   SPQ:ZRAQ gastritis  . NECK MASS EXCISION Right    "done in dr's office; behind right ear/side of ncek"  . POLYPECTOMY  11/21/2016   Procedure: POLYPECTOMY;  Surgeon: [name], MD;  [facility];  Service: Endoscopy;;  descending colon polyp  . SHOULDER ARTHROSCOPY W/ ROTATOR CUFF REPAIR Right 2006   acromioclavicular joint arthrosis    There were no vitals filed for this visit.   Subjective Assessment - 11/01/20 1654    Subjective  S: It just started hurting.    Pertinent History Patient is a 55 y/o male S/P chronic left shoulder pain which began with no known cause approximately occurring 1 year ago. 07/26/20 a MRI was completed showing OA and fraying of the superior labrum.  Dr. Luna Glasgow has referred patient to occupational therapy for evaluation and treatment.    Patient Stated Goals To increase use of his left arm and decreased pain.    Currently in Pain? Yes    Pain Score 3    76/22 with use or certain movements   Pain Location Shoulder    Pain Orientation Left    Pain Descriptors / Indicators Constant;Cramping;Aching  Pain Type Chronic pain    Pain Onset More than a month ago    Pain Frequency Constant    Aggravating Factors  certain movements - abduction, IR, er.    Pain Relieving Factors Aspercream, muscle relaxers (occassionally)    Effect of Pain on Daily Activities severe effect    Multiple Pain Sites No             [facility] OT Assessment - 11/01/20 1659      Assessment   Medical Diagnosis Chronic left shoulder pain    Referring Provider (OT) Sanjuana Kava, MD    Onset Date/Surgical Date --   approximately 1 year   Hand Dominance Right    Next MD Visit 11/16/20    Prior Therapy Pt received outpatient therapy in 2009 for right shoulder surgery.      Precautions    Precautions None      Restrictions   Weight Bearing Restrictions No      Balance Screen   Has the patient fallen in the past 6 months Yes    How many times? 1    Has the patient had a decrease in activity level because of a fear of falling?  No    Is the patient reluctant to leave their home because of a fear of falling?  No      Home  Environment   Family/patient expects to be discharged to: Private residence      Prior Function   Level of [place] Unemployed    Leisure enjoys fish although hasn't been able to since the arm pain.      ADL   ADL comments Pt is only able to do very limited ADL tasks. No lifting. waist level reaching.      Mobility   Mobility Status Independent      Written Expression   Dominant Hand Right      Vision - History   Baseline Vision Wears glasses all the time      Cognition   Overall Cognitive Status Within Functional Limits for tasks assessed      Observation/Other Assessments   Focus on Therapeutic Outcomes (FOTO)  complete next session      Posture/Postural Control   Posture/Postural Control Postural limitations    Postural Limitations Rounded Shoulders;Forward head      ROM / Strength   AROM / PROM / Strength AROM;PROM;Strength      Palpation   Palpation comment Max fascial restrictions palpated in the left upper arm, upper trapezius, and scapularis region.      AROM   Overall AROM Comments assessed standing. IR/er adducted    AROM Assessment Site Shoulder    Right/Left Shoulder Left    Left Shoulder Flexion 114 Degrees    Left Shoulder ABduction 80 Degrees    Left Shoulder Internal Rotation 90 Degrees    Left Shoulder External Rotation 49 Degrees      PROM   Overall PROM Comments Assessed supine. IR/er adducted.    PROM Assessment Site Shoulder    Right/Left Shoulder Left    Left Shoulder Flexion 120 Degrees    Left Shoulder ABduction 85 Degrees    Left Shoulder Internal Rotation 90 Degrees     Left Shoulder External Rotation 20 Degrees      Strength   Overall Strength Unable to assess;Due to pain  OT Education - 11/02/20 0957    Education Details table slides, seated A/ROM scapular row    Person(s) Educated Patient    Methods Explanation;Handout;Demonstration;Verbal cues    Comprehension Verbalized understanding;Returned demonstration            OT Short Term Goals - 11/02/20 1004      OT SHORT TERM GOAL #1   Title Patient will be educated and independent with HEP in order to faciliate progress in therapy and increase the use of his LUE as his nondominant extremity for 25% or more of daily tasks.    Time 3    Period Weeks    Status New    Target Date 11/23/20      OT SHORT TERM GOAL #2   Title Patient will increase his LUE P/ROM to [facility] in order to increase ability to donn/doff shirts easier when dressing.    Time 3    Period Weeks    Status New      OT SHORT TERM GOAL #3   Title Patient will decrease his LUE fascial restrictions to mod amount in order to increase functional mobility in the left shoulder that will increase ability to complete upper body dressing.    Time 3    Period Weeks    Status New             OT Long Term Goals - 11/02/20 1012      OT LONG TERM GOAL #1   Title Patient will increase his LUE A/ROM to [facility] in order to complete functional reaching tasks at or above shoulder level with less difficulty.    Time 6    Period Weeks    Status New    Target Date 12/13/20      OT LONG TERM GOAL #2   Title Patient will increase his LUE strength to 4/5 or better in order to return to desired leisure tasks such as fishing if possible.    Time 6    Period Weeks    Status New      OT LONG TERM GOAL #3   Title Patient will report a decrease in pain in the LUE of approximately 3/10 or less when completing functional ADL tasks.    Time 6    Period Weeks    Status New      OT LONG TERM GOAL #4   Title  Patient will decrease his LUE fascial restrictions to min amount or less in order to increase his functional mobility which is required for reaching tasks.    Time 6    Period Weeks    Status New                 Plan - 11/02/20 1000    Clinical Impression Statement A: Patient is a 55 y/o male S/P Chronic left shoulder pain causing increased fascial restrictions, decreased ROM and strength resulting in difficulty completing any functional task using his LUE. Prefers ice and states that the heat will initially feel good then quickly hurt more.    OT Occupational Profile and History Detailed Assessment- Review of Records and additional review of physical, cognitive, psychosocial history related to current functional performance    Occupational performance deficits (Please refer to evaluation for details): IADL's;Rest and Sleep;ADL's;Leisure    Body Structure / Function / Physical Skills ADL;UE functional use;Fascial restriction;Pain;ROM;Strength;Mobility;IADL    Rehab Potential Good    Clinical Decision Making Limited treatment options, no task modification necessary  Comorbidities Affecting Occupational Performance: Presence of comorbidities impacting occupational performance    Comorbidities impacting occupational performance description: chronic low back pain, history of right shoulder RTC repair    Modification or Assistance to Complete Evaluation  No modification of tasks or assist necessary to complete eval    OT Frequency 2x / week    OT Duration 6 weeks    OT Treatment/Interventions Self-care/ADL training;Ultrasound;Patient/family education;DME and/or AE instruction;Passive range of motion;Cryotherapy;Electrical Stimulation;Moist Heat;Neuromuscular education;Therapeutic activities;Manual Therapy;Therapeutic exercise    Plan P: Patient will benefit from skiiled OT services to increase functional use of his LUE and return to using it for all daily tasks with less pain and difficulty.  Treatment plan: Myofascial release, passive ROM, AA/ROM, A/ROM, general shoulder and scapular strengthening. Modalities PRN. Next session: Complete FOTO.    OT Home Exercise Plan eval: table slides, seated A/ROM scapular row.    Consulted and Agree with Plan of Care Patient           Patient will benefit from skilled therapeutic intervention in order to improve the following deficits and impairments:   Body Structure / Function / Physical Skills: ADL,UE functional use,Fascial restriction,Pain,ROM,Strength,Mobility,IADL       Visit Diagnosis: Other symptoms and signs involving the musculoskeletal system - Plan: Ot plan of care cert/re-cert  Stiffness of left shoulder, not elsewhere classified - Plan: Ot plan of care cert/re-cert  Chronic left shoulder pain - Plan: Ot plan of care cert/re-cert    Problem List Patient Active Problem List   Diagnosis Date Noted  . Paroxysmal atrial fibrillation (HCC)   . Lateral epicondylitis, left elbow 03/27/2019  . Globus sensation 02/21/2018  . Other cervical disc degeneration, unspecified cervical region 01/11/2018  . Tendinitis of right triceps 01/11/2018  . Neck pain 12/27/2017  . Lateral epicondylitis, right elbow 05/10/2017  . Constipation 12/24/2015  . Dyspnea 11/17/2015  . Coronary artery disease due to lipid rich plaque   . Heart palpitations 08/12/2015  . TIA (transient ischemic attack) 08/12/2015  . Colon cancer screening 08/02/2015  . Abdominal pain 12/18/2014  . Encounter for screening colonoscopy 12/18/2014  . Unspecified vitamin D deficiency 08/20/2012  . Arteriosclerotic cardiovascular disease (ASCVD) 04/11/2012  . Chronic low back pain   . Essential hypertension   . Anxiety and depression   . Controlled diabetes mellitus type 2 with complications ([place]) 53/97/6734  . Cigarette smoker 09/14/2010  . CHRONIC OBSTRUCTIVE PULMONARY DISEASE 09/14/2010  . HLD (hyperlipidemia) 11/25/2009  . GERD (gastroesophageal reflux  disease) 04/05/2009  . Hepatic steatosis 04/05/2009    Ailene Ravel, OTR/L,CBIS  [phone]  11/02/2020, 10:16 AM  [place] [address] [place], [place], [address] Phone: [phone]   Fax:  [phone]  Name: Richard [name] MRN: [id number] Date of Birth: 10-28-1965

## 2020-11-04 ENCOUNTER — Other Ambulatory Visit: Payer: Self-pay

## 2020-11-04 ENCOUNTER — Ambulatory Visit (HOSPITAL_COMMUNITY): Payer: Self-pay | Admitting: Occupational Therapy

## 2020-11-04 ENCOUNTER — Encounter (HOSPITAL_COMMUNITY): Payer: Self-pay | Admitting: Occupational Therapy

## 2020-11-04 DIAGNOSIS — R29898 Other symptoms and signs involving the musculoskeletal system: Secondary | ICD-10-CM

## 2020-11-04 DIAGNOSIS — M25512 Pain in left shoulder: Secondary | ICD-10-CM

## 2020-11-04 DIAGNOSIS — M25612 Stiffness of left shoulder, not elsewhere classified: Secondary | ICD-10-CM

## 2020-11-04 DIAGNOSIS — G8929 Other chronic pain: Secondary | ICD-10-CM

## 2020-11-04 NOTE — Therapy (Signed)
[place], [place], [address] Phone: [phone]   Fax:  [phone]  Occupational Therapy Treatment  Patient Details  Name: Richard [name] MRN: [id number] Date of Birth: 1966-02-26 Referring Provider (OT): Sanjuana Kava, MD   Encounter Date: 11/04/2020   OT End of Session - 11/04/20 1641    Visit Number 2    Number of Visits 12    Date for OT Re-Evaluation 12/13/20    Authorization Type CAFA (10/04/20-04/06/21) 100% financial coverage    OT Start Time 1600    OT Stop Time 1641    OT Time Calculation (min) 41 min    Activity Tolerance Patient tolerated treatment well;Patient limited by pain    Behavior During Therapy [facility] for tasks assessed/performed           Past Medical History:  Diagnosis Date  . Arthritis   . Asthma   . CAD (coronary artery disease)    a. Cardiac cath 07/2015 showed 65% distal Cx, 20% mid-distal LAD, 20% prox-distal RCA, EF 60%, EDP 67mmHg.  . Colitis 1990  . COPD (chronic obstructive pulmonary disease) ([place])   . Depression   . Essential hypertension   . Gastric ulcer 2003; 2012   2003: + esophagitis; negative H.pylori serology  2012: Dr. Oneida Alar, mild gastritis, Bravo PH probe placement, negative H.pylori  . GERD (gastroesophageal reflux disease)   . Hepatic steatosis   . History of hiatal hernia   . Hyperlipemia   . Overweight   . Panic attacks   . Paroxysmal atrial fibrillation (HCC)   . Stroke ([place])   . TIA (transient ischemic attack)   . Type II diabetes mellitus ([place])     Past Surgical History:  Procedure Laterality Date  . BRAVO [place] STUDY  05/03/2011   ZCH:YIFO gastritis/normal esophagus and duodenum  . CARDIAC CATHETERIZATION  1990s X 1; 2005; 08/12/2015  . CARDIAC CATHETERIZATION N/A 08/12/2015   Procedure: Left Heart Cath and Coronary Angiography;  Surgeon: Belva Crome, MD; LAD 20%, CFX 65%, RCA 20%, EF 60%   . COLONOSCOPY  1990  . COLONOSCOPY WITH PROPOFOL N/A  11/21/2016   Dr. Oneida Alar: non-thrombosed external hemorrhoids, one 6 mm polyp (polypoid lesion), internal hemorrhoids. TI Normal. 10 years screening  . ESOPHAGOGASTRODUODENOSCOPY  05/03/2011   YDX:AJOI gastritis  . NECK MASS EXCISION Right    "done in dr's office; behind right ear/side of ncek"  . POLYPECTOMY  11/21/2016   Procedure: POLYPECTOMY;  Surgeon: [name], MD;  Location[facility];  Service: Endoscopy;;  descending colon polyp  . SHOULDER ARTHROSCOPY W/ ROTATOR CUFF REPAIR Right 2006   acromioclavicular joint arthrosis    There were no vitals filed for this visit.   Subjective Assessment - 11/04/20 1557    Subjective  S: It's sore today.    Currently in Pain? Yes    Pain Score 4     Pain Location Shoulder    Pain Orientation Left    Pain Descriptors / Indicators Sore    Pain Type Chronic pain    Pain Onset More than a month ago    Pain Frequency Constant    Aggravating Factors  light activity    Pain Relieving Factors aspercream, muscle relaxers    Effect of Pain on Daily Activities severe effect on ADLs    Multiple Pain Sites No              [facility] OT Assessment - 11/04/20 1557  Assessment   Medical Diagnosis Chronic left shoulder pain      Precautions   Precautions None      Observation/Other Assessments   Focus on Therapeutic Outcomes (FOTO)  49/100                    OT Treatments/Exercises (OP) - 11/04/20 1602      Exercises   Exercises Shoulder      Shoulder Exercises: Supine   Protraction PROM;5 reps;AAROM;10 reps    Horizontal ABduction PROM;5 reps;AAROM;10 reps    External Rotation PROM;5 reps;AAROM;10 reps    Internal Rotation PROM;5 reps;AAROM;10 reps    Flexion PROM;5 reps;AAROM;10 reps    ABduction PROM;5 reps;AAROM;10 reps      Shoulder Exercises: Pulleys   Flexion 1 minute    ABduction 1 minute      Manual Therapy   Manual Therapy Myofascial release    Manual therapy comments completed separately from  therapeutic exercises    Myofascial Release myofascial release to left upper arm, anterior shoulder, trapezius, and scapular regions to decrease pain and fascial restrictions and increase joint ROM                    OT Short Term Goals - 11/04/20 1624      OT SHORT TERM GOAL #1   Title Patient will be educated and independent with HEP in order to faciliate progress in therapy and increase the use of his LUE as his nondominant extremity for 25% or more of daily tasks.    Time 3    Period Weeks    Status On-going    Target Date 11/23/20      OT SHORT TERM GOAL #2   Title Patient will increase his LUE P/ROM to [facility] in order to increase ability to donn/doff shirts easier when dressing.    Time 3    Period Weeks    Status On-going      OT SHORT TERM GOAL #3   Title Patient will decrease his LUE fascial restrictions to mod amount in order to increase functional mobility in the left shoulder that will increase ability to complete upper body dressing.    Time 3    Period Weeks    Status On-going             OT Long Term Goals - 11/04/20 1624      OT LONG TERM GOAL #1   Title Patient will increase his LUE A/ROM to [facility] in order to complete functional reaching tasks at or above shoulder level with less difficulty.    Time 6    Period Weeks    Status On-going      OT LONG TERM GOAL #2   Title Patient will increase his LUE strength to 4/5 or better in order to return to desired leisure tasks such as fishing if possible.    Time 6    Period Weeks    Status On-going      OT LONG TERM GOAL #3   Title Patient will report a decrease in pain in the LUE of approximately 3/10 or less when completing functional ADL tasks.    Time 6    Period Weeks    Status On-going      OT LONG TERM GOAL #4   Title Patient will decrease his LUE fascial restrictions to min amount or less in order to increase his functional mobility which is required for reaching tasks.  Time 6    Period  Weeks    Status On-going                 Plan - 11/04/20 1625    Clinical Impression Statement A: Pt reports increased soreness today from use. Pt with max fascial restrictions, myofascial release completed to address restrictions and increase ROM. Completed passive stretching, AA/ROM in supine. Also completing pulleys. Verbal cuing for form and technique.    Body Structure / Function / Physical Skills ADL;UE functional use;Fascial restriction;Pain;ROM;Strength;Mobility;IADL    Plan P: Continue with manual therapy and AA/ROM, working on improving tolerance to passive stretching    OT Home Exercise Plan eval: table slides, seated A/ROM scapular row.    Consulted and Agree with Plan of Care Patient           Patient will benefit from skilled therapeutic intervention in order to improve the following deficits and impairments:   Body Structure / Function / Physical Skills: ADL,UE functional use,Fascial restriction,Pain,ROM,Strength,Mobility,IADL       Visit Diagnosis: Other symptoms and signs involving the musculoskeletal system  Stiffness of left shoulder, not elsewhere classified  Chronic left shoulder pain    Problem List Patient Active Problem List   Diagnosis Date Noted  . Paroxysmal atrial fibrillation (HCC)   . Lateral epicondylitis, left elbow 03/27/2019  . Globus sensation 02/21/2018  . Other cervical disc degeneration, unspecified cervical region 01/11/2018  . Tendinitis of right triceps 01/11/2018  . Neck pain 12/27/2017  . Lateral epicondylitis, right elbow 05/10/2017  . Constipation 12/24/2015  . Dyspnea 11/17/2015  . Coronary artery disease due to lipid rich plaque   . Heart palpitations 08/12/2015  . TIA (transient ischemic attack) 08/12/2015  . Colon cancer screening 08/02/2015  . Abdominal pain 12/18/2014  . Encounter for screening colonoscopy 12/18/2014  . Unspecified vitamin D deficiency 08/20/2012  . Arteriosclerotic cardiovascular disease  (ASCVD) 04/11/2012  . Chronic low back pain   . Essential hypertension   . Anxiety and depression   . Controlled diabetes mellitus type 2 with complications ([place]) 26/33/3545  . Cigarette smoker 09/14/2010  . CHRONIC OBSTRUCTIVE PULMONARY DISEASE 09/14/2010  . HLD (hyperlipidemia) 11/25/2009  . GERD (gastroesophageal reflux disease) 04/05/2009  . Hepatic steatosis 04/05/2009   Guadelupe Sabin, OTR/L  [phone] 11/04/2020, 4:42 PM  [place] [address] [place], [place], [address] Phone: [phone]   Fax:  [phone]  Name: Richard [name] MRN: [id number] Date of Birth: [birth year]

## 2020-11-11 ENCOUNTER — Other Ambulatory Visit: Payer: Self-pay

## 2020-11-11 ENCOUNTER — Ambulatory Visit (HOSPITAL_COMMUNITY): Payer: Self-pay | Admitting: Specialist

## 2020-11-11 ENCOUNTER — Encounter (HOSPITAL_COMMUNITY): Payer: Self-pay | Admitting: Specialist

## 2020-11-11 DIAGNOSIS — G8929 Other chronic pain: Secondary | ICD-10-CM

## 2020-11-11 DIAGNOSIS — M25612 Stiffness of left shoulder, not elsewhere classified: Secondary | ICD-10-CM

## 2020-11-11 DIAGNOSIS — R29898 Other symptoms and signs involving the musculoskeletal system: Secondary | ICD-10-CM

## 2020-11-11 NOTE — Therapy (Signed)
[place], [place], [address] Phone: [phone]   Fax:  [phone]  Occupational Therapy Treatment  Patient Details  Name: Richard [name] MRN: [id number] Date of Birth: 1965/09/04 Referring Provider (OT): Sanjuana Kava, MD   Encounter Date: 11/11/2020   OT End of Session - 11/11/20 2104    Visit Number 3    Number of Visits 12    Date for OT Re-Evaluation 12/13/20    Authorization Type CAFA (10/04/20-04/06/21) 100% financial coverage    OT Start Time 1520    OT Stop Time 1605    OT Time Calculation (min) 45 min    Activity Tolerance Patient tolerated treatment well;Patient limited by pain    Behavior During Therapy [facility] for tasks assessed/performed           Past Medical History:  Diagnosis Date  . Arthritis   . Asthma   . CAD (coronary artery disease)    a. Cardiac cath 07/2015 showed 65% distal Cx, 20% mid-distal LAD, 20% prox-distal RCA, EF 60%, EDP 17mHg.  . Colitis 1990  . COPD (ch[place] obstructive pulmonary disease) [place]   . Depression   . Essential hypertension   . Gastric ulcer 2003; 2012   2003: + esophagitis; negative H.pylori serology  2012: [name] mild gastritis, Bravo PH probe placement, negative H.pylori  . GERD (gastroesophageal reflux disease)   . Hepatic steatosis   . History of hiatal hernia   . Hyperlipemia   . Overweight   . Panic attacks   . Paroxysmal atrial fibrillation (HCC)   . Stroke ([place]   . TIA (transient ischemic attack)   . Type II diabetes mellitus ([place]     Past Surgical History:  Procedure Laterality Date  . BRAVO PTensedSTUDY  05/03/2011   SXVQ:MGQQgastritis/normal esophagus and duodenum  . CARDIAC CATHETERIZATION  1990s X 1; 2005; 08/12/2015  . CARDIAC CATHETERIZATION N/A 08/12/2015   Procedure: Left Heart Cath and Coronary Angiography;  Surgeon: HBelva Crome MD; LAD 20%, CFX 65%, RCA 20%, EF 60%   . COLONOSCOPY  1990  . COLONOSCOPY WITH PROPOFOL N/A  11/21/2016   [name] non-thrombosed external hemorrhoids, one 6 mm polyp (polypoid lesion), internal hemorrhoids. TI Normal. 10 years screening  . ESOPHAGOGASTRODUODENOSCOPY  05/03/2011   SPYP:PJKDgastritis  . NECK MASS EXCISION Right    "done in dr's office; behind right ear/side of ncek"  . POLYPECTOMY  11/21/2016   Procedure: POLYPECTOMY;  Surgeon: FDanie Binder MD;  [facility];  Service: Endoscopy;;  descending colon polyp  . SHOULDER ARTHROSCOPY W/ ROTATOR CUFF REPAIR Right 2006   acromioclavicular joint arthrosis    There were no vitals filed for this visit.   Subjective Assessment - 11/11/20 1540    Subjective  S:  I can tell this working, I dont get sharp pain when I reach out anymore.  it used to be sharp and last, but now it hurts and when I bring it back to my body it stops hurting.    Currently in Pain? Yes    Pain Score 1     Pain Location Shoulder    Pain Orientation Left    Pain Descriptors / Indicators Sore              [facility] OT Assessment - 11/11/20 0001      Assessment   Medical Diagnosis Chronic left shoulder pain    Referring Provider (OT) WSanjuana Kava MD  Precautions   Precautions None                    OT Treatments/Exercises (OP) - 11/11/20 0001      Shoulder Exercises: Supine   Protraction PROM;5 reps;AAROM;12 reps    Horizontal ABduction PROM;5 reps;AAROM;12 reps    External Rotation PROM;5 reps;AAROM;12 reps    Internal Rotation PROM;5 reps;AAROM;12 reps    Flexion PROM;5 reps;AAROM;12 reps    ABduction PROM;5 reps    ABduction Limitations attempted could not complete due to pain      Shoulder Exercises: Seated   Abduction AAROM;12 reps   to 100 degrees and then stopped due to pain     Shoulder Exercises: Standing   Extension AROM;10 reps    Row AROM;10 reps    Shoulder Elevation AROM;10 reps      Shoulder Exercises: Pulleys   Flexion 1 minute    ABduction 1 minute      Shoulder Exercises: Therapy  Ball   Flexion 10 reps    ABduction 10 reps    Right/Left --      Manual Therapy   Manual Therapy Myofascial release    Manual therapy comments completed separately from therapeutic exercises    Myofascial Release myofascial release to left upper arm, anterior shoulder, trapezius, and scapular regions to decrease pain and fascial restrictions and increase joint ROM                    OT Short Term Goals - 11/04/20 1624      OT SHORT TERM GOAL #1   Title Patient will be educated and independent with HEP in order to faciliate progress in therapy and increase the use of his LUE as his nondominant extremity for 25% or more of daily tasks.    Time 3    Period Weeks    Status On-going    Target Date 11/23/20      OT SHORT TERM GOAL #2   Title Patient will increase his LUE P/ROM to WFL in order to increase ability to donn/doff shirts easier when dressing.    Time 3    Period Weeks    Status On-going      OT SHORT TERM GOAL #3   Title Patient will decrease his LUE fascial restrictions to mod amount in order to increase functional mobility in the left shoulder that will increase ability to complete upper body dressing.    Time 3    Period Weeks    Status On-going             OT Long Term Goals - 11/04/20 1624      OT LONG TERM GOAL #1   Title Patient will increase his LUE A/ROM to WFL in order to complete functional reaching tasks at or above shoulder level with less difficulty.    Time 6    Period Weeks    Status On-going      OT LONG TERM GOAL #2   Title Patient will increase his LUE strength to 4/5 or better in order to return to desired leisure tasks such as fishing if possible.    Time 6    Period Weeks    Status On-going      OT LONG TERM GOAL #3   Title Patient will report a decrease in pain in the LUE of approximately 3/10 or less when completing functional ADL tasks.    Time 6    Period Weeks      Status On-going      OT LONG TERM GOAL #4   Title  Patient will decrease his LUE fascial restrictions to min amount or less in order to increase his functional mobility which is required for reaching tasks.    Time 6    Period Weeks    Status On-going                 Plan - 11/11/20 2104    Clinical Impression Statement A:  patient reports less intense and duration of pain with abduction and flexion during functional tasks since last visit.  abduction aa/rom in supine very painful at 90-120 range, therefore completed in seated with less pain.    Body Structure / Function / Physical Skills ADL;UE functional use;Fascial restriction;Pain;ROM;Strength;Mobility;IADL    Plan P:  Continue aa/rom in supine, working towards WFL.  add prot/ret//elev/dep, thumb tacks, wall wash for improved functional reaching.           Patient will benefit from skilled therapeutic intervention in order to improve the following deficits and impairments:   Body Structure / Function / Physical Skills: ADL,UE functional use,Fascial restriction,Pain,ROM,Strength,Mobility,IADL       Visit Diagnosis: Other symptoms and signs involving the musculoskeletal system  Stiffness of left shoulder, not elsewhere classified  Chronic left shoulder pain    Problem List Patient Active Problem List   Diagnosis Date Noted  . Paroxysmal atrial fibrillation (HCC)   . Lateral epicondylitis, left elbow 03/27/2019  . Globus sensation 02/21/2018  . Other cervical disc degeneration, unspecified cervical region 01/11/2018  . Tendinitis of right triceps 01/11/2018  . Neck pain 12/27/2017  . Lateral epicondylitis, right elbow 05/10/2017  . Constipation 12/24/2015  . Dyspnea 11/17/2015  . Coronary artery disease due to lipid rich plaque   . Heart palpitations 08/12/2015  . TIA (transient ischemic attack) 08/12/2015  . Colon cancer screening 08/02/2015  . Abdominal pain 12/18/2014  . Encounter for screening colonoscopy 12/18/2014  . Unspecified vitamin D deficiency  08/20/2012  . Arteriosclerotic cardiovascular disease (ASCVD) 04/11/2012  . Chronic low back pain   . Essential hypertension   . Anxiety and depression   . Controlled diabetes mellitus type 2 with complications (HCC) 10/03/2010  . Cigarette smoker 09/14/2010  . CHRONIC OBSTRUCTIVE PULMONARY DISEASE 09/14/2010  . HLD (hyperlipidemia) 11/25/2009  . GERD (gastroesophageal reflux disease) 04/05/2009  . Hepatic steatosis 04/05/2009    Bethany H. Murray, MHA, OTR/L [phone]  11/11/2020, 9:07 PM  [facility] [address] [place], [place], [address] Phone: [phone]   Fax:  [phone]  Name: Lendon L Cerrito MRN: [id number] Date of Birth: 06/28/1966 

## 2020-11-12 ENCOUNTER — Encounter (HOSPITAL_COMMUNITY): Payer: Self-pay | Admitting: Occupational Therapy

## 2020-11-15 ENCOUNTER — Telehealth: Payer: Self-pay | Admitting: Cardiology

## 2020-11-15 DIAGNOSIS — I10 Essential (primary) hypertension: Secondary | ICD-10-CM

## 2020-11-15 NOTE — Telephone Encounter (Signed)
Pt had not been keeping a consistent bp log, but last bp checked was 130/70 on 4/30.  Verbalized to pt to check bp once a day going forward and log it.

## 2020-11-15 NOTE — Telephone Encounter (Signed)
What has his blood pressure been running?  We already cut lisinopril dose back.  Could consider reducing Norvasc to 5 mg daily if blood pressure has not been elevated.

## 2020-11-15 NOTE — Telephone Encounter (Signed)
New message     Pt c/o medication issue:  1. Name of Medication: amLODipine (NORVASC) 10 MG tablet  2. How are you currently taking this medication (dosage and times per day)? 1 tablet  3. Are you having a reaction (difficulty breathing--STAT)? yes  4. What is your medication issue? Its giving him indegestion , can he get it in a capsule, its causing him to have afib issues

## 2020-11-15 NOTE — Telephone Encounter (Signed)
Pt c/o chest palpitations along with headaches. Pt denies CP, SOB, dizziness. Pt stated that within 5-10 minutes of taking Amlodipine 10 mg tablets, pt starts to feel palpitations, as though he is going back into A. Fib. Pt also states that he believes amlodipine is causing him to have indigestion. Pt states he still takes esomeprazole 40 mg tablets and that helps a little. Please advise.

## 2020-11-16 ENCOUNTER — Encounter (HOSPITAL_COMMUNITY): Payer: Self-pay | Admitting: Occupational Therapy

## 2020-11-16 ENCOUNTER — Ambulatory Visit (HOSPITAL_COMMUNITY): Payer: Self-pay | Attending: Orthopaedic Surgery | Admitting: Occupational Therapy

## 2020-11-16 ENCOUNTER — Other Ambulatory Visit: Payer: Self-pay

## 2020-11-16 ENCOUNTER — Encounter: Payer: Self-pay | Admitting: Orthopaedic Surgery

## 2020-11-16 ENCOUNTER — Ambulatory Visit (INDEPENDENT_AMBULATORY_CARE_PROVIDER_SITE_OTHER): Payer: Self-pay | Admitting: Orthopaedic Surgery

## 2020-11-16 DIAGNOSIS — G8929 Other chronic pain: Secondary | ICD-10-CM

## 2020-11-16 DIAGNOSIS — M25512 Pain in left shoulder: Secondary | ICD-10-CM | POA: Insufficient documentation

## 2020-11-16 DIAGNOSIS — F17210 Nicotine dependence, cigarettes, uncomplicated: Secondary | ICD-10-CM

## 2020-11-16 DIAGNOSIS — M25612 Stiffness of left shoulder, not elsewhere classified: Secondary | ICD-10-CM | POA: Insufficient documentation

## 2020-11-16 DIAGNOSIS — M7712 Lateral epicondylitis, left elbow: Secondary | ICD-10-CM

## 2020-11-16 DIAGNOSIS — R29898 Other symptoms and signs involving the musculoskeletal system: Secondary | ICD-10-CM | POA: Insufficient documentation

## 2020-11-16 NOTE — Therapy (Signed)
[place], [place], [address] Phone: [phone]   Fax:  [phone]  Occupational Therapy Treatment  Patient Details  Name: Richard [name] MRN: [id number] Date of Birth: 1965/11/27 Referring Provider (OT): Sanjuana Kava, MD   Encounter Date: 11/16/2020   OT End of Session - 11/16/20 1557    Visit Number 4    Number of Visits 12    Date for OT Re-Evaluation 12/13/20    Authorization Type CAFA (10/04/20-04/06/21) 100% financial coverage    OT Start Time 1515    OT Stop Time 1554    OT Time Calculation (min) 39 min    Activity Tolerance Patient tolerated treatment well;Patient limited by pain    Behavior During Therapy [facility] for tasks assessed/performed           Past Medical History:  Diagnosis Date  . Arthritis   . Asthma   . CAD (coronary artery disease)    a. Cardiac cath 07/2015 showed 65% distal Cx, 20% mid-distal LAD, 20% prox-distal RCA, EF 60%, EDP 39mHg.  . Colitis 1990  . COPD (chronic obstructive pulmonary disease) ([place]   . Depression   . Essential hypertension   . Gastric ulcer 2003; 2012   2003: + esophagitis; negative H.pylori serology  2012: [name] mild gastritis, Bravo PH probe placement, negative H.pylori  . GERD (gastroesophageal reflux disease)   . Hepatic steatosis   . History of hiatal hernia   . Hyperlipemia   . Overweight   . Panic attacks   . Paroxysmal atrial fibrillation (HCC)   . Stroke ([place]   . TIA (transient ischemic attack)   . Type II diabetes mellitus ([place]     Past Surgical History:  Procedure Laterality Date  . BRAVO [place]  05/03/2011   STYO:MAYOgastritis/normal esophagus and duodenum  . CARDIAC CATHETERIZATION  1990s X 1; 2005; 08/12/2015  . CARDIAC CATHETERIZATION N/A 08/12/2015   Procedure: Left Heart Cath and Coronary Angiography;  Surgeon: HBelva Crome MD; LAD 20%, CFX 65%, RCA 20%, EF 60%   . COLONOSCOPY  1990  . COLONOSCOPY WITH PROPOFOL N/A  11/21/2016   [name] non-thrombosed external hemorrhoids, one 6 mm polyp (polypoid lesion), internal hemorrhoids. TI Normal. 10 years screening  . ESOPHAGOGASTRODUODENOSCOPY  05/03/2011   SKHT:XHFSgastritis  . NECK MASS EXCISION Right    "done in dr's office; behind right ear/side of ncek"  . POLYPECTOMY  11/21/2016   Procedure: POLYPECTOMY;  Surgeon: FDanie Binder MD;  [facility];  Service: Endoscopy;;  descending colon polyp  . SHOULDER ARTHROSCOPY W/ ROTATOR CUFF REPAIR Right 2006   acromioclavicular joint arthrosis    There were no vitals filed for this visit.   Subjective Assessment - 11/16/20 1508    Subjective  S: When I move a certain direction I'll get sharp pains.    Currently in Pain? Yes    Pain Score 3     Pain Location Shoulder    Pain Orientation Left    Pain Descriptors / Indicators Sore    Pain Type Chronic pain    Pain Radiating Towards N/A    Pain Onset More than a month ago    Pain Frequency Constant    Aggravating Factors  movement, activity    Pain Relieving Factors muscle relaxers    Effect of Pain on Daily Activities mod to max effect on ADLs    Multiple Pain Sites No  [facility] OT Assessment - 11/16/20 1508      Assessment   Medical Diagnosis Chronic left shoulder pain      Precautions   Precautions None                    OT Treatments/Exercises (OP) - 11/16/20 1518      Exercises   Exercises Shoulder      Shoulder Exercises: Supine   Protraction PROM;5 reps;AAROM;12 reps    Horizontal ABduction PROM;5 reps;AAROM;12 reps    External Rotation PROM;5 reps;AAROM;12 reps    Internal Rotation PROM;5 reps;AAROM;12 reps    Flexion PROM;5 reps;AAROM;12 reps    ABduction PROM;5 reps;AAROM;10 reps      Shoulder Exercises: Standing   Protraction AAROM;10 reps    Horizontal ABduction AAROM;10 reps    Flexion AAROM;10 reps      Shoulder Exercises: ROM/Strengthening   Wall Wash 1'    Prot/Ret//Elev/Dep 1'       Manual Therapy   Manual Therapy Myofascial release    Manual therapy comments completed separately from therapeutic exercises    Myofascial Release myofascial release to left upper arm, anterior shoulder, trapezius, and scapular regions to decrease pain and fascial restrictions and increase joint ROM                    OT Short Term Goals - 11/04/20 1624      OT SHORT TERM GOAL #1   Title Patient will be educated and independent with HEP in order to faciliate progress in therapy and increase the use of his LUE as his nondominant extremity for 25% or more of daily tasks.    Time 3    Period Weeks    Status On-going    Target Date 11/23/20      OT SHORT TERM GOAL #2   Title Patient will increase his LUE P/ROM to [facility] in order to increase ability to donn/doff shirts easier when dressing.    Time 3    Period Weeks    Status On-going      OT SHORT TERM GOAL #3   Title Patient will decrease his LUE fascial restrictions to mod amount in order to increase functional mobility in the left shoulder that will increase ability to complete upper body dressing.    Time 3    Period Weeks    Status On-going             OT Long Term Goals - 11/04/20 1624      OT LONG TERM GOAL #1   Title Patient will increase his LUE A/ROM to [facility] in order to complete functional reaching tasks at or above shoulder level with less difficulty.    Time 6    Period Weeks    Status On-going      OT LONG TERM GOAL #2   Title Patient will increase his LUE strength to 4/5 or better in order to return to desired leisure tasks such as fishing if possible.    Time 6    Period Weeks    Status On-going      OT LONG TERM GOAL #3   Title Patient will report a decrease in pain in the LUE of approximately 3/10 or less when completing functional ADL tasks.    Time 6    Period Weeks    Status On-going      OT LONG TERM GOAL #4   Title Patient will decrease his LUE fascial restrictions to  min amount or  less in order to increase his functional mobility which is required for reaching tasks.    Time 6    Period Weeks    Status On-going                 Plan - 11/16/20 1535    Clinical Impression Statement A: Pt reports he saw the doctor this morning and the doctor said to tell the therapist that he has tennis elbow too in the left arm. Continued with myofascial release and passive stretching, pt able to tolerate flexion to approximately 120 degrees passive and with AA/ROM. Added protraction, flexion, and horizontal abduction AA/ROM in standing, as well as wall wash and prot/ret/elev/dep. Verbal cuing for form and technique.    Body Structure / Function / Physical Skills ADL;UE functional use;Fascial restriction;Pain;ROM;Strength;Mobility;IADL    Plan P: Continue with AA/ROM and update for HEP    OT Home Exercise Plan eval: table slides, seated A/ROM scapular row.    Consulted and Agree with Plan of Care Patient           Patient will benefit from skilled therapeutic intervention in order to improve the following deficits and impairments:   Body Structure / Function / Physical Skills: ADL,UE functional use,Fascial restriction,Pain,ROM,Strength,Mobility,IADL       Visit Diagnosis: Other symptoms and signs involving the musculoskeletal system  Stiffness of left shoulder, not elsewhere classified  Chronic left shoulder pain    Problem List Patient Active Problem List   Diagnosis Date Noted  . Paroxysmal atrial fibrillation (HCC)   . Lateral epicondylitis, left elbow 03/27/2019  . Globus sensation 02/21/2018  . Other cervical disc degeneration, unspecified cervical region 01/11/2018  . Tendinitis of right triceps 01/11/2018  . Neck pain 12/27/2017  . Lateral epicondylitis, right elbow 05/10/2017  . Constipation 12/24/2015  . Dyspnea 11/17/2015  . Coronary artery disease due to lipid rich plaque   . Heart palpitations 08/12/2015  . TIA (transient ischemic attack)  08/12/2015  . Colon cancer screening 08/02/2015  . Abdominal pain 12/18/2014  . Encounter for screening colonoscopy 12/18/2014  . Unspecified vitamin D deficiency 08/20/2012  . Arteriosclerotic cardiovascular disease (ASCVD) 04/11/2012  . Chronic low back pain   . Essential hypertension   . Anxiety and depression   . Controlled diabetes mellitus type 2 with complications ([place]) 25/42/7062  . Cigarette smoker 09/14/2010  . CHRONIC OBSTRUCTIVE PULMONARY DISEASE 09/14/2010  . HLD (hyperlipidemia) 11/25/2009  . GERD (gastroesophageal reflux disease) 04/05/2009  . Hepatic steatosis 04/05/2009   Guadelupe Sabin, OTR/L  [phone] 11/16/2020, 3:57 PM  [place], [place], [address] Phone: [phone]   Fax:  [phone]  Name: Richard [name] MRN: [id number] Date of Birth: 10/24/1965

## 2020-11-16 NOTE — Progress Notes (Signed)
Patient Richard [name], male DOB:03-24-1966, 55 y.o. QAS:[id number]  Chief Complaint  Patient presents with  . Shoulder Pain    Left shld pain, s/p PT visits     HPI  Richard [name] is a 55 y.o. male who has left shoulder pain.  He has gone to OT and is slowly getting better.  He has several sessions left.  I have reviewed the OT notes.  He has developed pain of the left lateral epicondyle. He has no redness, swelling or trauma.   There is no height or weight on file to calculate BMI.  ROS  Review of Systems  Constitutional: Positive for activity change.  Respiratory: Positive for shortness of breath.   Cardiovascular: Positive for chest pain and palpitations.  Musculoskeletal: Positive for arthralgias, joint swelling and myalgias.  All other systems reviewed and are negative.   All other systems reviewed and are negative.  The following is a summary of the past history medically, past history surgically, known current medicines, social history and family history.  This information is gathered electronically by the computer from prior information and documentation.  I review this each visit and have found including this information at this point in the chart is beneficial and informative.    Past Medical History:  Diagnosis Date  . Arthritis   . Asthma   . CAD (coronary artery disease)    a. Cardiac cath 07/2015 showed 65% distal Cx, 20% mid-distal LAD, 20% prox-distal RCA, EF 60%, EDP 46mmHg.  . Colitis 1990  . COPD (chronic obstructive pulmonary disease) ([place])   . Depression   . Essential hypertension   . Gastric ulcer 2003; 2012   2003: + esophagitis; negative H.pylori serology  2012: Dr. Oneida Alar, mild gastritis, Bravo PH probe placement, negative H.pylori  . GERD (gastroesophageal reflux disease)   . Hepatic steatosis   . History of hiatal hernia   . Hyperlipemia   . Overweight   . Panic attacks   . Paroxysmal atrial fibrillation (HCC)   . Stroke ([place])   . TIA  (transient ischemic attack)   . Type II diabetes mellitus ([place])     Past Surgical History:  Procedure Laterality Date  . BRAVO [place] STUDY  05/03/2011   NLG:XQJJ gastritis/normal esophagus and duodenum  . CARDIAC CATHETERIZATION  1990s X 1; 2005; 08/12/2015  . CARDIAC CATHETERIZATION N/A 08/12/2015   Procedure: Left Heart Cath and Coronary Angiography;  Surgeon: Belva Crome, MD; LAD 20%, CFX 65%, RCA 20%, EF 60%   . COLONOSCOPY  1990  . COLONOSCOPY WITH PROPOFOL N/A 11/21/2016   Dr. Oneida Alar: non-thrombosed external hemorrhoids, one 6 mm polyp (polypoid lesion), internal hemorrhoids. TI Normal. 10 years screening  . ESOPHAGOGASTRODUODENOSCOPY  05/03/2011   HER:DEYC gastritis  . NECK MASS EXCISION Right    "done in dr's office; behind right ear/side of ncek"  . POLYPECTOMY  11/21/2016   Procedure: POLYPECTOMY;  Surgeon: [name], MD;  [facility];  Service: Endoscopy;;  descending colon polyp  . SHOULDER ARTHROSCOPY W/ ROTATOR CUFF REPAIR Right 2006   acromioclavicular joint arthrosis    Family History  Problem Relation Age of Onset  . Lung cancer Mother   . Alcohol abuse Mother   . Heart attack Father 40  . Diabetes Father   . Alcohol abuse Father   . Hypertension Brother   . Hypertension Brother   . Anxiety disorder Sister   . Depression Sister   . Anxiety disorder Sister   .  Heart attack Brother 4  . Diabetes Brother   . Hypertension Brother   . Seizures Brother   . Dementia Paternal Uncle   . Dementia Cousin   . ADD / ADHD Daughter   . Colon cancer Neg Hx   . Drug abuse Neg Hx   . Bipolar disorder Neg Hx   . OCD Neg Hx   . Paranoid behavior Neg Hx   . Schizophrenia Neg Hx   . Sexual abuse Neg Hx   . Physical abuse Neg Hx     Social History Social History   Tobacco Use  . Smoking status: Current Every Day Smoker    Packs/day: 0.25    Years: 25.00    Pack years: 6.25    Types: Cigarettes    Start date: 07/17/1982  . Smokeless tobacco: Never  Used  Vaping Use  . Vaping Use: Never used  Substance Use Topics  . Alcohol use: No    Alcohol/week: 0.0 standard drinks  . Drug use: No    Allergies  Allergen Reactions  . Dexilant [Dexlansoprazole] Anaphylaxis  . Mushroom Ext Cmplx(Shiitake-Reishi-Mait) Anaphylaxis    Rapid heart rate.  . Penicillins Anaphylaxis    Has patient had a PCN reaction causing immediate rash, facial/tongue/throat swelling, SOB or lightheadedness with hypotension: Yes Has patient had a PCN reaction causing severe rash involving mucus membranes or skin necrosis: No Has patient had a PCN reaction that required hospitalization Yes Has patient had a PCN reaction occurring within the last 10 years: No If all of the above answers are "NO", then may proceed with Cephalosporin use.   [name] Doxycycline Nausea And Vomiting         Current Outpatient Medications  Medication Sig Dispense Refill  . ALPRAZolam (XANAX) 0.5 MG tablet Take one tablet by mouth three times a day and two tablets at night 150 tablet 2  . aluminum-magnesium hydroxide 200-200 MG/5ML suspension Take by mouth every 6 (six) hours as needed for indigestion.    [name] amLODipine (NORVASC) 10 MG tablet Take 1 tablet (10 mg total) by mouth daily. 90 tablet 3  . budesonide-formoterol (SYMBICORT) 80-4.5 MCG/ACT inhaler Inhale 2 puffs into the lungs 2 (two) times daily as needed.    . calcium carbonate (TUMS - DOSED IN MG ELEMENTAL CALCIUM) 500 MG chewable tablet Chew 1 tablet by mouth daily as needed for indigestion or heartburn.    . cyclobenzaprine (FLEXERIL) 10 MG tablet Take 1 tablet (10 mg total) by mouth as needed for muscle spasms.    [name] esomeprazole (NEXIUM) 40 MG capsule Take 1 capsule (40 mg total) by mouth 2 (two) times daily before a meal. 60 capsule 5  . JANUVIA 100 MG tablet TAKE 1 Tablet BY MOUTH ONCE DAILY (Patient taking differently: Take 100 mg by mouth daily.) 90 tablet 1  . lisinopril (ZESTRIL) 5 MG tablet Take 1 tablet (5 mg total) by mouth  daily. 90 tablet 3  . metoprolol succinate (TOPROL-XL) 50 MG 24 hr tablet Take 1 tablet (50 mg total) by mouth daily. 135 tablet 3  . nitroGLYCERIN (NITROSTAT) 0.4 MG SL tablet Place 1 tablet (0.4 mg total) under the tongue every 5 (five) minutes x 3 doses as needed for chest pain (if no relief after 3rd dose, proceed to the ED for an evaluation). 25 tablet 3  . PROVENTIL HFA 108 (90 Base) MCG/ACT inhaler INHALE 2 PUFFS BY MOUTH EVERY 6 HOURS AS NEEDED FOR COUGHING, WHEEZING, OR SHORTNESS OF BREATH 13.4 g 1  .  simvastatin (ZOCOR) 20 MG tablet TAKE 1 TABLET BY MOUTH AT BEDTIME (Patient taking differently: Take 20 mg by mouth at bedtime.) 30 tablet 6  . XARELTO 20 MG TABS tablet Take 1 tablet (20 mg total) by mouth daily with supper. 90 tablet 3   No current facility-administered medications for this visit.     Physical Exam  There were no vitals taken for this visit.  Constitutional: overall normal hygiene, normal nutrition, well developed, normal grooming, normal body habitus. Assistive device:none  Musculoskeletal: gait and station Limp none, muscle tone and strength are normal, no tremors or atrophy is present.  .  Neurological: coordination overall normal.  Deep tendon reflex/nerve stretch intact.  Sensation normal.  Cranial nerves II-XII intact.   Skin:   Normal overall no scars, lesions, ulcers or rashes. No psoriasis.  Psychiatric: Alert and oriented x 3.  Recent memory intact, remote memory unclear.  Normal mood and affect. Well groomed.  Good eye contact.  Cardiovascular: overall no swelling, no varicosities, no edema bilaterally, normal temperatures of the legs and arms, no clubbing, cyanosis and good capillary refill.  Left shoulder has decreased motion and some pain but better than last time.  NV intact.  He has pain over the left lateral epicondyle with no swelling or redness. ROM is full.  He has pain to resisted dorsi extenson of the wrist.  Lymphatic: palpation is  normal.  All other systems reviewed and are negative   The patient has been educated about the nature of the problem(s) and counseled on treatment options.  The patient appeared to understand what I have discussed and is in agreement with it.  Encounter Diagnoses  Name Primary?  . Chronic left shoulder pain Yes  . Lateral epicondylitis, left elbow   . Nicotine dependence, cigarettes, uncomplicated     PLAN Call if any problems.  Precautions discussed.  Continue current medications.   Return to clinic 3 weeks   Continue OT.  Do ice massage to the left elbow.  I have explained how to do it.  Electronically Signed Sanjuana Kava, MD 5/3/202210:17 AM

## 2020-11-17 MED ORDER — AMLODIPINE BESYLATE 5 MG PO TABS: 5.0000 mg | ORAL_TABLET | Freq: Every day | ORAL | 3 refills | Status: DC

## 2020-11-17 NOTE — Telephone Encounter (Signed)
Medication dose change to 5 mg tablets once daily. Pt and spouse made aware. Pt verbalized understanding of medication changes and medication list was updated to reflect this change.Pt to log bp and update our office on Tuesday Nov 23, 2020 with updated list.

## 2020-11-17 NOTE — Telephone Encounter (Signed)
LMTCB

## 2020-11-17 NOTE — Telephone Encounter (Signed)
Returning call- please call [phone]

## 2020-11-18 ENCOUNTER — Ambulatory Visit (HOSPITAL_COMMUNITY): Payer: Self-pay

## 2020-11-18 ENCOUNTER — Other Ambulatory Visit: Payer: Self-pay

## 2020-11-18 ENCOUNTER — Encounter (HOSPITAL_COMMUNITY): Payer: Self-pay

## 2020-11-18 DIAGNOSIS — M25612 Stiffness of left shoulder, not elsewhere classified: Secondary | ICD-10-CM

## 2020-11-18 DIAGNOSIS — R29898 Other symptoms and signs involving the musculoskeletal system: Secondary | ICD-10-CM

## 2020-11-18 DIAGNOSIS — G8929 Other chronic pain: Secondary | ICD-10-CM

## 2020-11-18 DIAGNOSIS — M25512 Pain in left shoulder: Secondary | ICD-10-CM

## 2020-11-18 NOTE — Therapy (Signed)
[place], [place], [address] Phone: [phone]   Fax:  [phone]  Occupational Therapy Treatment  Patient Details  Name: Richard [name] MRN: [id number] Date of Birth: December 26, 1965 Referring Provider (OT): Sanjuana Kava, MD   Encounter Date: 11/18/2020   OT End of Session - 11/18/20 1458    Visit Number 5    Number of Visits 12    Date for OT Re-Evaluation 12/13/20    Authorization Type CAFA (10/04/20-04/06/21) 100% financial coverage    OT Start Time 1430    OT Stop Time 1517    OT Time Calculation (min) 47 min    Activity Tolerance Patient tolerated treatment well    Behavior During Therapy [facility] for tasks assessed/performed           Past Medical History:  Diagnosis Date  . Arthritis   . Asthma   . CAD (coronary artery disease)    a. Cardiac cath 07/2015 showed 65% distal Cx, 20% mid-distal LAD, 20% prox-distal RCA, EF 60%, EDP 23mmHg.  . Colitis 1990  . COPD (chronic obstructive pulmonary disease) ([place])   . Depression   . Essential hypertension   . Gastric ulcer 2003; 2012   2003: + esophagitis; negative H.pylori serology  2012: Dr. Oneida Alar, mild gastritis, Bravo PH probe placement, negative H.pylori  . GERD (gastroesophageal reflux disease)   . Hepatic steatosis   . History of hiatal hernia   . Hyperlipemia   . Overweight   . Panic attacks   . Paroxysmal atrial fibrillation (HCC)   . Stroke ([place])   . TIA (transient ischemic attack)   . Type II diabetes mellitus ([place])     Past Surgical History:  Procedure Laterality Date  . BRAVO [place] STUDY  05/03/2011   OEU:MPNT gastritis/normal esophagus and duodenum  . CARDIAC CATHETERIZATION  1990s X 1; 2005; 08/12/2015  . CARDIAC CATHETERIZATION N/A 08/12/2015   Procedure: Left Heart Cath and Coronary Angiography;  Surgeon: Belva Crome, MD; LAD 20%, CFX 65%, RCA 20%, EF 60%   . COLONOSCOPY  1990  . COLONOSCOPY WITH PROPOFOL N/A 11/21/2016   Dr. Oneida Alar:  non-thrombosed external hemorrhoids, one 6 mm polyp (polypoid lesion), internal hemorrhoids. TI Normal. 10 years screening  . ESOPHAGOGASTRODUODENOSCOPY  05/03/2011   IRW:ERXV gastritis  . NECK MASS EXCISION Right    "done in dr's office; behind right ear/side of ncek"  . POLYPECTOMY  11/21/2016   Procedure: POLYPECTOMY;  Surgeon: [name], MD;  [facility];  Service: Endoscopy;;  descending colon polyp  . SHOULDER ARTHROSCOPY W/ ROTATOR CUFF REPAIR Right 2006   acromioclavicular joint arthrosis    There were no vitals filed for this visit.   Subjective Assessment - 11/18/20 1434    Currently in Pain? Yes    Pain Score 4     Pain Location Shoulder    Pain Orientation Left    Pain Descriptors / Indicators Sore    Pain Type Chronic pain    Pain Onset Today    Pain Frequency Constant    Aggravating Factors  might have slept on it    Pain Relieving Factors nothing today    Effect of Pain on Daily Activities mod-max effect    Multiple Pain Sites No              [facility] OT Assessment - 11/18/20 1514      Assessment   Medical Diagnosis Chronic left shoulder pain  Precautions   Precautions None                    OT Treatments/Exercises (OP) - 11/18/20 1453      Exercises   Exercises Shoulder      Shoulder Exercises: Supine   Protraction PROM;5 reps;AAROM;15 reps    Horizontal ABduction PROM;5 reps;AAROM;15 reps    External Rotation PROM;5 reps;AAROM;15 reps    Internal Rotation PROM;5 reps;AAROM;15 reps    Flexion PROM;5 reps;AAROM;15 reps    ABduction PROM;5 reps;AAROM;12 reps      Shoulder Exercises: Standing   Protraction AAROM;12 reps    Horizontal ABduction AAROM;10 reps    External Rotation AAROM;12 reps    Internal Rotation AAROM;12 reps    Flexion AAROM;12 reps    ABduction AAROM;Other (comment)   7X     Manual Therapy   Manual Therapy Myofascial release    Manual therapy comments completed separately from therapeutic  exercises    Myofascial Release myofascial release to left upper arm, anterior shoulder, trapezius, and scapular regions to decrease pain and fascial restrictions and increase joint ROM                  OT Education - 11/18/20 1506    Education Details AA/ROM shoulder exercises    Person(s) Educated Patient    Methods Explanation;Demonstration;Handout    Comprehension Verbalized understanding;Returned demonstration            OT Short Term Goals - 11/04/20 1624      OT SHORT TERM GOAL #1   Title Patient will be educated and independent with HEP in order to faciliate progress in therapy and increase the use of his LUE as his nondominant extremity for 25% or more of daily tasks.    Time 3    Period Weeks    Status On-going    Target Date 11/23/20      OT SHORT TERM GOAL #2   Title Patient will increase his LUE P/ROM to [facility] in order to increase ability to donn/doff shirts easier when dressing.    Time 3    Period Weeks    Status On-going      OT SHORT TERM GOAL #3   Title Patient will decrease his LUE fascial restrictions to mod amount in order to increase functional mobility in the left shoulder that will increase ability to complete upper body dressing.    Time 3    Period Weeks    Status On-going             OT Long Term Goals - 11/04/20 1624      OT LONG TERM GOAL #1   Title Patient will increase his LUE A/ROM to [facility] in order to complete functional reaching tasks at or above shoulder level with less difficulty.    Time 6    Period Weeks    Status On-going      OT LONG TERM GOAL #2   Title Patient will increase his LUE strength to 4/5 or better in order to return to desired leisure tasks such as fishing if possible.    Time 6    Period Weeks    Status On-going      OT LONG TERM GOAL #3   Title Patient will report a decrease in pain in the LUE of approximately 3/10 or less when completing functional ADL tasks.    Time 6    Period Weeks    Status  On-going  OT LONG TERM GOAL #4   Title Patient will decrease his LUE fascial restrictions to min amount or less in order to increase his functional mobility which is required for reaching tasks.    Time 6    Period Weeks    Status On-going                 Plan - 11/18/20 1539    Clinical Impression Statement A: Completed manual techniques to left upper arm, upper trapezius, and scapularis region to address max fascial restrictions. Able to tolerate P/ROM to approximately 75% of range. during first repetition of horizontal abduction passive, patient did experience a pop although no additional pain was noted and remaining repetietion were completed without any event. Completed AA/ROM supine and standing with VC for form and technique. Provided updated HEP with AA/ROM.    Body Structure / Function / Physical Skills ADL;UE functional use;Fascial restriction;Pain;ROM;Strength;Mobility;IADL    Plan P: Follow up on AA/ROM. Add PVC pipe slide and pulleys.    Consulted and Agree with Plan of Care Patient           Patient will benefit from skilled therapeutic intervention in order to improve the following deficits and impairments:   Body Structure / Function / Physical Skills: ADL,UE functional use,Fascial restriction,Pain,ROM,Strength,Mobility,IADL       Visit Diagnosis: Stiffness of left shoulder, not elsewhere classified  Chronic left shoulder pain  Other symptoms and signs involving the musculoskeletal system    Problem List Patient Active Problem List   Diagnosis Date Noted  . Paroxysmal atrial fibrillation (HCC)   . Lateral epicondylitis, left elbow 03/27/2019  . Globus sensation 02/21/2018  . Other cervical disc degeneration, unspecified cervical region 01/11/2018  . Tendinitis of right triceps 01/11/2018  . Neck pain 12/27/2017  . Lateral epicondylitis, right elbow 05/10/2017  . Constipation 12/24/2015  . Dyspnea 11/17/2015  . Coronary artery disease due to  lipid rich plaque   . Heart palpitations 08/12/2015  . TIA (transient ischemic attack) 08/12/2015  . Colon cancer screening 08/02/2015  . Abdominal pain 12/18/2014  . Encounter for screening colonoscopy 12/18/2014  . Unspecified vitamin D deficiency 08/20/2012  . Arteriosclerotic cardiovascular disease (ASCVD) 04/11/2012  . Chronic low back pain   . Essential hypertension   . Anxiety and depression   . Controlled diabetes mellitus type 2 with complications ([place]) 62/83/1517  . Cigarette smoker 09/14/2010  . CHRONIC OBSTRUCTIVE PULMONARY DISEASE 09/14/2010  . HLD (hyperlipidemia) 11/25/2009  . GERD (gastroesophageal reflux disease) 04/05/2009  . Hepatic steatosis 04/05/2009    Ailene Ravel, OTR/L,CBIS  [phone]  11/18/2020, 3:42 PM  [facility] [address] [place], [place], [address] Phone: [phone]   Fax:  [phone]  Name: BOB DAVERSA MRN: [id number] Date of Birth: Mar 03, 1966

## 2020-11-18 NOTE — Telephone Encounter (Signed)
Called pt no answer. Left msg to call back.  

## 2020-11-18 NOTE — Patient Instructions (Signed)

## 2020-11-18 NOTE — Telephone Encounter (Signed)
Please stop lisinopril completely and see if cough resolves.  This could be replaced with losartan depending on blood pressure.  If he is cutting Norvasc 10 mg in half, please change this to Norvasc 5 mg tablets once daily.  Continue to track blood pressure and we can make changes from there.

## 2020-11-18 NOTE — Telephone Encounter (Signed)
Please give pt's wife a call- has some concerns about the 2 medications he's taking for his BP [phone]

## 2020-11-18 NOTE — Telephone Encounter (Signed)
Spoke with wife and pt who states that the norvasc 10 mg power pill that is dissolving in his mouth and giving him acid reflux, and a knot in his chest when he takes it. He states that this starts his Afib. Pt states that he takes with water and cracker or bread. Pt is also taking lisinopril and reports that this is giving him a cough for 3 days. Called pharmacist with [facility] and was told that she has not heard of this for Norvasc. She states that since he had a decrease in dose that he is to cut the tablet in 1/2. Please advise.

## 2020-11-19 NOTE — Telephone Encounter (Addendum)
Spoke with pt and received BP readings as follows: 129/79 53, 125/81 52, 130/79 54. At that time he was taking both Norvasc 10 mg and Lisinopril 5 mg. Pt informed to discontinue Lisinopril. Pt will continue to monitor BP.  Pt states that he had an antibiotic to dissolve in his throat and now when he takes the Norvasc it is irritating his throat. Please advise.

## 2020-11-19 NOTE — Telephone Encounter (Signed)
Richard [name] will try taking norvasc with a meal and let us know next week how he did.

## 2020-11-19 NOTE — Telephone Encounter (Signed)
Perhaps he could try taking it with some food.  If we stop Norvasc completely, his blood pressure is going to go back up too high.  If he truly cannot tolerate that medication, we will have to switch to something completely different.

## 2020-11-22 ENCOUNTER — Encounter: Payer: Self-pay | Admitting: Physician Assistant

## 2020-11-22 ENCOUNTER — Ambulatory Visit: Payer: Self-pay | Admitting: Physician Assistant

## 2020-11-22 ENCOUNTER — Other Ambulatory Visit: Payer: Self-pay

## 2020-11-22 VITALS — BP 128/75 | HR 53 | Temp 97.8°F | Wt 190.0 lb

## 2020-11-22 DIAGNOSIS — K219 Gastro-esophageal reflux disease without esophagitis: Secondary | ICD-10-CM

## 2020-11-22 DIAGNOSIS — J449 Chronic obstructive pulmonary disease, unspecified: Secondary | ICD-10-CM

## 2020-11-22 DIAGNOSIS — F17200 Nicotine dependence, unspecified, uncomplicated: Secondary | ICD-10-CM

## 2020-11-22 DIAGNOSIS — Z8673 Personal history of transient ischemic attack (TIA), and cerebral infarction without residual deficits: Secondary | ICD-10-CM

## 2020-11-22 DIAGNOSIS — I10 Essential (primary) hypertension: Secondary | ICD-10-CM

## 2020-11-22 DIAGNOSIS — I2510 Atherosclerotic heart disease of native coronary artery without angina pectoris: Secondary | ICD-10-CM

## 2020-11-22 DIAGNOSIS — E118 Type 2 diabetes mellitus with unspecified complications: Secondary | ICD-10-CM

## 2020-11-22 DIAGNOSIS — E785 Hyperlipidemia, unspecified: Secondary | ICD-10-CM

## 2020-11-22 DIAGNOSIS — I480 Paroxysmal atrial fibrillation: Secondary | ICD-10-CM

## 2020-11-22 MED ORDER — CHLORTHALIDONE 25 MG PO TABS: 12.5000 mg | ORAL_TABLET | Freq: Every day | ORAL | 11 refills | Status: DC

## 2020-11-22 NOTE — Telephone Encounter (Signed)
Pts wife called stating pt had high BP readings over the weekend   Please call [phone]

## 2020-11-22 NOTE — Telephone Encounter (Signed)
Continue Norvasc if he is tolerating.  Consider starting chlorthalidone 12.5 mg once daily.  These should work well together.  Check BMET in 10 days if he stays on the medication.

## 2020-11-22 NOTE — Telephone Encounter (Signed)
Returned call to pt. No answer. Left msg to call back.  

## 2020-11-22 NOTE — Progress Notes (Signed)
BP 128/75   Pulse (!) 53   Temp 97.8 F (36.6 C)   Wt 190 lb (86.2 kg)   SpO2 98%   BMI 26.50 kg/m    Subjective:    Patient ID: Richard [name], male    DOB: 01-Jan-1966, 55 y.o.   MRN: [id number]  HPI: Richard [name] is a 55 y.o. male presenting on 11/22/2020 for No chief complaint on file.   HPI   Pt had a negative covid 19 screening questionnaire.    Pt history of CVA, dyslipidemia, GERD, DM, HTN, anxiety and depression, CAD, PAF, tobacco abuse.  He continues to see cardiology.  He says they are working with him on his bp.  He says his bp is good but it "shoots up" after he smokes a cigarette.   He says he is having feeling of having norvasc pill stuck in throat.  The cardiologist is working with him on that.  Pt is taking nexium bid and Pt has follow up with gastroenterology in June.    He has Not gotten the covid vaccination  He says he only smokes at night before going to sleep.       Relevant past medical, surgical, family and social history reviewed and updated as indicated. Interim medical history since our last visit reviewed. Allergies and medications reviewed and updated.    Current Outpatient Medications:  .  ALPRAZolam (XANAX) 0.5 MG tablet, Take one tablet by mouth three times a day and two tablets at night, Disp: 150 tablet, Rfl: 2 .  aluminum-magnesium hydroxide 200-200 MG/5ML suspension, Take by mouth every 6 (six) hours as needed for indigestion., Disp: , Rfl:  .  amLODipine (NORVASC) 5 MG tablet, Take 1 tablet (5 mg total) by mouth daily., Disp: 90 tablet, Rfl: 3 .  budesonide-formoterol (SYMBICORT) 80-4.5 MCG/ACT inhaler, Inhale 2 puffs into the lungs 2 (two) times daily as needed., Disp: , Rfl:  .  calcium carbonate (TUMS - DOSED IN MG ELEMENTAL CALCIUM) 500 MG chewable tablet, Chew 1 tablet by mouth daily as needed for indigestion or heartburn., Disp: , Rfl:  .  cyclobenzaprine (FLEXERIL) 10 MG tablet, Take 1 tablet (10 mg total) by mouth as  needed for muscle spasms., Disp: , Rfl:  .  esomeprazole (NEXIUM) 40 MG capsule, Take 1 capsule (40 mg total) by mouth 2 (two) times daily before a meal., Disp: 60 capsule, Rfl: 5 .  JANUVIA 100 MG tablet, TAKE 1 Tablet BY MOUTH ONCE DAILY (Patient taking differently: Take 100 mg by mouth daily.), Disp: 90 tablet, Rfl: 1 .  metoprolol succinate (TOPROL-XL) 50 MG 24 hr tablet, Take 1 tablet (50 mg total) by mouth daily., Disp: 135 tablet, Rfl: 3 .  nitroGLYCERIN (NITROSTAT) 0.4 MG SL tablet, Place 1 tablet (0.4 mg total) under the tongue every 5 (five) minutes x 3 doses as needed for chest pain (if no relief after 3rd dose, proceed to the ED for an evaluation)., Disp: 25 tablet, Rfl: 3 .  PROVENTIL HFA 108 (90 Base) MCG/ACT inhaler, INHALE 2 PUFFS BY MOUTH EVERY 6 HOURS AS NEEDED FOR COUGHING, WHEEZING, OR SHORTNESS OF BREATH, Disp: 13.4 g, Rfl: 1 .  simvastatin (ZOCOR) 20 MG tablet, TAKE 1 TABLET BY MOUTH AT BEDTIME (Patient taking differently: Take 20 mg by mouth at bedtime.), Disp: 30 tablet, Rfl: 6 .  XARELTO 20 MG TABS tablet, Take 1 tablet (20 mg total) by mouth daily with supper., Disp: 90 tablet, Rfl: 3     Review  of Systems  Per HPI unless specifically indicated above     Objective:    BP 128/75   Pulse (!) 53   Temp 97.8 F (36.6 C)   Wt 190 lb (86.2 kg)   SpO2 98%   BMI 26.50 kg/m   Wt Readings from Last 3 Encounters:  11/22/20 190 lb (86.2 kg)  11/02/20 174 lb (78.9 kg)  10/26/20 191 lb 9.6 oz (86.9 kg)    Physical Exam Vitals reviewed.  Constitutional:      General: He is not in acute distress.    Appearance: He is well-developed. He is not toxic-appearing.  HENT:     Head: Normocephalic and atraumatic.  Cardiovascular:     Rate and Rhythm: Normal rate and regular rhythm.  Pulmonary:     Effort: Pulmonary effort is normal.     Breath sounds: Normal breath sounds. No wheezing.  Abdominal:     General: Bowel sounds are normal.     Palpations: Abdomen is soft.      Tenderness: There is abdominal tenderness in the epigastric area. There is no guarding or rebound.     Comments: Mild epigastric tenderness.  No rebound no guarding no mass or pulsation.  Musculoskeletal:     Cervical back: Neck supple.     Right lower leg: No edema.     Left lower leg: No edema.  Lymphadenopathy:     Cervical: No cervical adenopathy.  Skin:    General: Skin is warm and dry.  Neurological:     Mental Status: He is alert and oriented to person, place, and time.  Psychiatric:        Behavior: Behavior normal.              Assessment & Plan:    Encounter Diagnoses  Name Primary?  . Controlled diabetes mellitus type 2 with complications, unspecified whether long term insulin use ([place]) Yes  . Essential hypertension   . Gastroesophageal reflux disease, unspecified whether esophagitis present   . Hyperlipidemia, unspecified hyperlipidemia type   . Paroxysmal atrial fibrillation (HCC)   . Tobacco use disorder   . History of stroke   . Coronary artery disease involving native heart without angina pectoris, unspecified vessel or lesion type   . Chronic obstructive pulmonary disease, unspecified COPD type ([place])       -Labs UTD -pt to continue current medications -pt to continue with ppi and follow up with GI as scheduled -pt to continue with cardiology per their recomenation -discussed ways to stop the bedtime cigarette including recommended smoking nicotine gum in lieu of the cigarette -encouraged pt to get covid vaccination -pt to follow up 2 months.  Will update labs prior to that appointment.

## 2020-11-22 NOTE — Addendum Note (Signed)
Addended by: Levonne Hubert on: 11/22/2020 02:53 PM   Modules accepted: Orders

## 2020-11-22 NOTE — Telephone Encounter (Signed)
Pt notified and voiced understanding. Medication order sent to [facility] and order placed for lab.
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
## 2020-11-23 ENCOUNTER — Ambulatory Visit (HOSPITAL_COMMUNITY): Payer: Self-pay | Admitting: Specialist

## 2020-11-23 ENCOUNTER — Other Ambulatory Visit: Payer: Self-pay

## 2020-11-23 ENCOUNTER — Encounter (HOSPITAL_COMMUNITY): Payer: Self-pay | Admitting: Specialist

## 2020-11-23 DIAGNOSIS — M25512 Pain in left shoulder: Secondary | ICD-10-CM

## 2020-11-23 DIAGNOSIS — G8929 Other chronic pain: Secondary | ICD-10-CM

## 2020-11-23 DIAGNOSIS — M25612 Stiffness of left shoulder, not elsewhere classified: Secondary | ICD-10-CM

## 2020-11-23 DIAGNOSIS — R29898 Other symptoms and signs involving the musculoskeletal system: Secondary | ICD-10-CM

## 2020-11-23 NOTE — Therapy (Signed)
[facility] [name], [place], [address] Phone: [phone]   Fax:  [phone]  Occupational Therapy Treatment  Patient Details  Name: Richard [name] MRN: [id number] Date of Birth: January 11, 1966 Referring Provider (OT): Sanjuana Kava, MD   Encounter Date: 11/23/2020   OT End of Session - 11/23/20 1649    Visit Number 6    Number of Visits 12    Date for OT Re-Evaluation 12/13/20    Authorization Type [employer]FA (10/04/20-04/06/21) 100% financial coverage    OT Start Time 1518   OT Stop Time 1600   OT Time Calculation (min) 40 min    Activity Tolerance Patient tolerated treatment well    Behavior During Therapy [facility] for tasks assessed/performed           Past Medical History:  Diagnosis Date  . Arthritis   . Asthma   . CAD (coronary artery disease)    a. Cardiac cath 07/2015 showed 65% distal Cx, 20% mid-distal LAD, 20% prox-distal RCA, EF 60%, EDP 22mmHg.  . Colitis 1990  . COPD (chronic obstructive pulmonary disease) ([place])   . Depression   . Essential hypertension   . Gastric ulcer 2003; 2012   2003: + esophagitis; negative H.pylori serology  2012: Dr. Oneida Alar, mild gastritis, Bravo PH probe placement, negative H.pylori  . GERD (gastroesophageal reflux disease)   . Hepatic steatosis   . History of hiatal hernia   . Hyperlipemia   . Overweight   . Panic attacks   . Paroxysmal atrial fibrillation (HCC)   . Stroke ([place])   . TIA (transient ischemic attack)   . Type II diabetes mellitus ([place])     Past Surgical History:  Procedure Laterality Date  . BRAVO [place] STUDY  05/03/2011   QIH:KVQQ gastritis/normal esophagus and duodenum  . CARDIAC CATHETERIZATION  1990s X 1; 2005; 08/12/2015  . CARDIAC CATHETERIZATION N/A 08/12/2015   Procedure: Left Heart Cath and Coronary Angiography;  Surgeon: Belva Crome, MD; LAD 20%, CFX 65%, RCA 20%, EF 60%   . COLONOSCOPY  1990  . COLONOSCOPY WITH PROPOFOL N/A 11/21/2016   Dr. Oneida Alar:  non-thrombosed external hemorrhoids, one 6 mm polyp (polypoid lesion), internal hemorrhoids. TI Normal. 10 years screening  . ESOPHAGOGASTRODUODENOSCOPY  05/03/2011   VZD:GLOV gastritis  . NECK MASS EXCISION Right    "done in dr's office; behind right ear/side of ncek"  . POLYPECTOMY  11/21/2016   Procedure: POLYPECTOMY;  Surgeon: [name], MD;  Location: [facility];  Service: Endoscopy;;  descending colon polyp  . SHOULDER ARTHROSCOPY W/ ROTATOR CUFF REPAIR Right 2006   acromioclavicular joint arthrosis    There were no vitals filed for this visit.   Subjective Assessment - 11/23/20 1648    Subjective  S:  I can do some things easier.  I can reach out to the side and I can cross my arms with much less pain.    Currently in Pain? Yes    Pain Score 3     Pain Location Shoulder    Pain Orientation Left    Pain Descriptors / Indicators Sore    Pain Type Acute pain              [facility] OT Assessment - 11/23/20 0001      Assessment   Medical Diagnosis Chronic left shoulder pain    Referring Provider (OT) Sanjuana Kava, MD      Precautions   Precautions None  OT Treatments/Exercises (OP) - 11/23/20 0001      Shoulder Exercises: Supine   Protraction PROM;5 reps;AAROM;15 reps    Horizontal ABduction PROM;5 reps;AAROM;15 reps    External Rotation PROM;5 reps;AAROM;15 reps    Internal Rotation PROM;5 reps;AAROM;15 reps    Flexion PROM;5 reps;AAROM;15 reps    ABduction PROM;5 reps;AAROM;12 reps      Shoulder Exercises: Standing   Protraction AAROM;15 reps    Horizontal ABduction AAROM;15 reps    External Rotation AAROM;15 reps    Internal Rotation AAROM;15 reps    Flexion AAROM;15 reps    ABduction AAROM;15 reps    Extension Theraband;10 reps    Theraband Level (Shoulder Extension) Level 2 (Red)    Row Theraband;10 reps    Theraband Level (Shoulder Row) Level 2 (Red)    Retraction Theraband;10 reps    Theraband Level (Shoulder  Retraction) Level 2 (Red)      Shoulder Exercises: ROM/Strengthening   Wall Wash 1'    Other ROM/Strengthening Exercises pvc pipe slide 10 times flexion and 10 times abduction      Manual Therapy   Manual Therapy Myofascial release    Manual therapy comments completed separately from therapeutic exercises    Myofascial Release myofascial release to left upper arm, anterior shoulder, trapezius, and scapular regions to decrease pain and fascial restrictions and increase joint ROM                    OT Short Term Goals - 11/04/20 1624      OT SHORT TERM GOAL #1   Title Patient will be educated and independent with HEP in order to faciliate progress in therapy and increase the use of his LUE as his nondominant extremity for 25% or more of daily tasks.    Time 3    Period Weeks    Status On-going    Target Date 11/23/20      OT SHORT TERM GOAL #2   Title Patient will increase his LUE P/ROM to [facility] in order to increase ability to donn/doff shirts easier when dressing.    Time 3    Period Weeks    Status On-going      OT SHORT TERM GOAL #3   Title Patient will decrease his LUE fascial restrictions to mod amount in order to increase functional mobility in the left shoulder that will increase ability to complete upper body dressing.    Time 3    Period Weeks    Status On-going             OT Long Term Goals - 11/04/20 1624      OT LONG TERM GOAL #1   Title Patient will increase his LUE A/ROM to [facility] in order to complete functional reaching tasks at or above shoulder level with less difficulty.    Time 6    Period Weeks    Status On-going      OT LONG TERM GOAL #2   Title Patient will increase his LUE strength to 4/5 or better in order to return to desired leisure tasks such as fishing if possible.    Time 6    Period Weeks    Status On-going      OT LONG TERM GOAL #3   Title Patient will report a decrease in pain in the LUE of approximately 3/10 or less when  completing functional ADL tasks.    Time 6    Period Weeks    Status On-going  OT LONG TERM GOAL #4   Title Patient will decrease his LUE fascial restrictions to min amount or less in order to increase his functional mobility which is required for reaching tasks.    Time 6    Period Weeks    Status On-going                 Plan - 11/23/20 1650    Clinical Impression Statement A:  patient is performing aa/rom in supine and seated with good form.  patient able to complete pvc pipe slide with rotation at end range for improved functional reaching.    Body Structure / Function / Physical Skills ADL;UE functional use;Fascial restriction;Pain;ROM;Strength;Mobility;IADL    Plan P:  Increase to a/rom in supine and seated as able. complete scapular theraband exercises for improved scapular stability and improved functional reaching.           Patient will benefit from skilled therapeutic intervention in order to improve the following deficits and impairments:   Body Structure / Function / Physical Skills: ADL,UE functional use,Fascial restriction,Pain,ROM,Strength,Mobility,IADL       Visit Diagnosis: Stiffness of left shoulder, not elsewhere classified  Chronic left shoulder pain  Other symptoms and signs involving the musculoskeletal system    Problem List Patient Active Problem List   Diagnosis Date Noted  . Paroxysmal atrial fibrillation (HCC)   . Lateral epicondylitis, left elbow 03/27/2019  . Globus sensation 02/21/2018  . Other cervical disc degeneration, unspecified cervical region 01/11/2018  . Tendinitis of right triceps 01/11/2018  . Neck pain 12/27/2017  . Lateral epicondylitis, right elbow 05/10/2017  . Constipation 12/24/2015  . Dyspnea 11/17/2015  . Coronary artery disease due to lipid rich plaque   . Heart palpitations 08/12/2015  . TIA (transient ischemic attack) 08/12/2015  . Colon cancer screening 08/02/2015  . Abdominal pain 12/18/2014  .  Encounter for screening colonoscopy 12/18/2014  . Unspecified vitamin D deficiency 08/20/2012  . Arteriosclerotic cardiovascular disease (ASCVD) 04/11/2012  . Chronic low back pain   . Essential hypertension   . Anxiety and depression   . Controlled diabetes mellitus type 2 with complications ([place]) 98/33/8250  . Cigarette smoker 09/14/2010  . CHRONIC OBSTRUCTIVE PULMONARY DISEASE 09/14/2010  . HLD (hyperlipidemia) 11/25/2009  . GERD (gastroesophageal reflux disease) 04/05/2009  . Hepatic steatosis 04/05/2009    Vangie Bicker, [place], OTR/L [phone]  11/23/2020, 4:53 PM  [facility] [address] [place], [place], [address] Phone: [phone]   Fax:  [phone]  Name: BODEN STUCKY MRN: [id number] Date of Birth: 03-09-1966

## 2020-11-24 ENCOUNTER — Encounter (HOSPITAL_COMMUNITY): Payer: Self-pay | Admitting: Psychiatry

## 2020-11-24 ENCOUNTER — Telehealth (INDEPENDENT_AMBULATORY_CARE_PROVIDER_SITE_OTHER): Payer: Self-pay | Admitting: Psychiatry

## 2020-11-24 DIAGNOSIS — F4001 Agoraphobia with panic disorder: Secondary | ICD-10-CM

## 2020-11-24 MED ORDER — ALPRAZOLAM 0.5 MG PO TABS: ORAL_TABLET | ORAL | 2 refills | Status: DC

## 2020-11-24 NOTE — Progress Notes (Signed)
Virtual Visit via Telephone Note  I connected with [place] on 11/24/20 at 10:00 AM EDT by telephone and verified that I am speaking with the correct person using two identifiers.  Location: Patient: home Provider: home office   I discussed the limitations, risks, security and privacy concerns of performing an evaluation and management service by telephone and the availability of in person appointments. I also discussed with the patient that there may be a patient responsible charge related to this service. The patient expressed understanding and agreed to proceed.     I discussed the assessment and treatment plan with the patient. The patient was provided an opportunity to ask questions and all were answered. The patient agreed with the plan and demonstrated an understanding of the instructions.   The patient was advised to call back or seek an in-person evaluation if the symptoms worsen or if the condition fails to improve as anticipated.  I provided 15 minutes of non-face-to-face time during this encounter.   [name], MD  [facility] MD/PA/NP OP Progress Note  11/24/2020 10:40 AM Richard [name]  MRN:  [id number]  Chief Complaint:  Chief Complaint    Anxiety; Follow-up     HPI: Thispatient is a 55 year old Caucasian male who came for his followup appointment. He is currently living with his wife in [place]. He is unemployed and applying for disability.  The patient states that he has been employed for 5 years. 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 He got injured on the job and both shoulders were torn. He has not been able to work ever since and he feels very badly about this. 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  Since he's not been able to work the patient has been having increasingly depressed and anxious. He feels like his body gives out when he is trying to exert himself even in a minor  way  The patient returns for follow-up after 3 months.  He was admitted to the hospital in March for what was presumably a TIA.  He lost vision in his right eye for a short time.  Fortunately all of his studies including MRI/MRA were negative.  His lab work was also quite good and his diabetes is under good control.  He is still trying to get his blood pressure control and is being tried on various medications.  He is trying very hard to quit smoking but still takes a few puffs a day.  He states without taking a puff before bed he has jerkiness in his body.  I urged him to ask his cardiologist to about getting on a NicoDerm patch.  He states the Xanax continues to help his anxiety and without it he would be "in big trouble."  He is not really doing too much because he is afraid of his blood pressure either going too high or too low but he and his wife are getting out to some degree.  He denies being depressed Visit Diagnosis:    ICD-10-CM   1. 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 Arthritis   . Asthma   . CAD (coronary artery disease)    a. Cardiac cath 07/2015 showed 65% distal Cx, 20% mid-distal LAD, 20% prox-distal RCA, EF 60%, EDP  72mmHg.  . Colitis 1990  . COPD (chronic obstructive pulmonary disease) ([place])   . Depression   . Essential hypertension   . Gastric ulcer 2003; 2012   2003: + esophagitis; negative H.pylori serology  2012: Dr. Oneida Alar, mild gastritis, Bravo PH probe placement, negative H.pylori  . GERD (gastroesophageal reflux disease)   . Hepatic steatosis   . History of hiatal hernia   . Hyperlipemia   . Overweight   . Panic attacks   . Paroxysmal atrial fibrillation (HCC)   . Stroke ([place])   . TIA (transient ischemic attack)   . Type II diabetes mellitus ([place])     Past Surgical History:  Procedure Laterality Date  . BRAVO [place] STUDY   05/03/2011   HEN:IDPO gastritis/normal esophagus and duodenum  . CARDIAC CATHETERIZATION  1990s X 1; 2005; 08/12/2015  . CARDIAC CATHETERIZATION N/A 08/12/2015   Procedure: Left Heart Cath and Coronary Angiography;  Surgeon: Belva Crome, MD; LAD 20%, CFX 65%, RCA 20%, EF 60%   . COLONOSCOPY  1990  . COLONOSCOPY WITH PROPOFOL N/A 11/21/2016   Dr. Oneida Alar: non-thrombosed external hemorrhoids, one 6 mm polyp (polypoid lesion), internal hemorrhoids. TI Normal. 10 years screening  . ESOPHAGOGASTRODUODENOSCOPY  05/03/2011   EUM:PNTI gastritis  . NECK MASS EXCISION Right    "done in dr's office; behind right ear/side of ncek"  . POLYPECTOMY  11/21/2016   Procedure: POLYPECTOMY;  Surgeon: [name], MD;  [facility];  Service: Endoscopy;;  descending colon polyp  . SHOULDER ARTHROSCOPY W/ ROTATOR CUFF REPAIR Right 2006   acromioclavicular joint arthrosis    Family Psychiatric History: see below  Family History:  Family History  Problem Relation Age of Onset  . Lung cancer Mother   . Alcohol abuse Mother   . Heart attack Father 12  . Diabetes Father   . Alcohol abuse Father   . Hypertension Brother   . Hypertension Brother   . Anxiety disorder Sister   . Depression Sister   . Anxiety disorder Sister   . Heart attack Brother 66  . Diabetes Brother   . Hypertension Brother   . Seizures Brother   . Dementia Paternal Uncle   . Dementia Cousin   . ADD / ADHD Daughter   . Colon cancer Neg Hx   . Drug abuse Neg Hx   . Bipolar disorder Neg Hx   . OCD Neg Hx   . Paranoid behavior Neg Hx   . Schizophrenia Neg Hx   . Sexual abuse Neg Hx   . Physical abuse Neg Hx     Social History:  Social History   Socioeconomic History  . Marital status: Married    Spouse name: Not on file  . Number of children: Not on file  . Years of education: Not on file  . Highest education level: Not on file  Occupational History  . Occupation: full time    Employer: UNEMPLOYED  Tobacco  Use  . Smoking status: Current Every Day Smoker    Packs/day: 0.25    Years: 25.00    Pack years: 6.25    Types: Cigarettes    Start date: 07/17/1982  . Smokeless tobacco: Never Used  Vaping Use  . Vaping Use: Never used  Substance and Sexual Activity  . Alcohol use: No    Alcohol/week: 0.0 standard drinks  . Drug use: No  . Sexual activity: Yes    Birth control/protection: None  Other Topics Concern  . Not on  file  Social History Narrative   Pt lives in [place] with wife.  5 children.  Unemployed due to panic attacks and back pain   Social Determinants of Health   Financial Resource Strain: Not on file  Food Insecurity: Not on file  Transportation Needs: Not on file  Physical Activity: Not on file  Stress: Not on file  Social Connections: Not on file    Allergies:  Allergies  Allergen Reactions  . Dexilant [Dexlansoprazole] Anaphylaxis  . Mushroom Ext Cmplx(Shiitake-Reishi-Mait) Anaphylaxis    Rapid heart rate.  . Penicillins Anaphylaxis    Has patient had a PCN reaction causing immediate rash, facial/tongue/throat swelling, SOB or lightheadedness with hypotension: Yes Has patient had a PCN reaction causing severe rash involving mucus membranes or skin necrosis: No Has patient had a PCN reaction that required hospitalization Yes Has patient had a PCN reaction occurring within the last 10 years: No If all of the above answers are "NO", then may proceed with Cephalosporin use.   [name] Doxycycline Nausea And Vomiting         Metabolic Disorder Labs: Lab Results  Component Value Date   HGBA1C 6.4 (H) 10/01/2020   MPG 136.98 10/01/2020   MPG 142.72 08/10/2020   No results found for: PROLACTIN Lab Results  Component Value Date   CHOL 112 10/01/2020   TRIG 78 10/01/2020   HDL 30 (L) 10/01/2020   CHOLHDL 3.7 10/01/2020   VLDL 16 10/01/2020   LDLCALC 66 10/01/2020   LDLCALC 58 08/10/2020   Lab Results  Component Value Date   TSH 3.119 10/01/2020   TSH  0.958 04/21/2011    Therapeutic Level Labs: No results found for: LITHIUM No results found for: VALPROATE No components found for:  CBMZ  Current Medications: Current Outpatient Medications  Medication Sig Dispense Refill  . ALPRAZolam (XANAX) 0.5 MG tablet Take one tablet by mouth three times a day and two tablets at night 150 tablet 2  . aluminum-magnesium hydroxide 200-200 MG/5ML suspension Take by mouth every 6 (six) hours as needed for indigestion.    [name] amLODipine (NORVASC) 5 MG tablet Take 1 tablet (5 mg total) by mouth daily. 90 tablet 3  . budesonide-formoterol (SYMBICORT) 80-4.5 MCG/ACT inhaler Inhale 2 puffs into the lungs 2 (two) times daily as needed.    . calcium carbonate (TUMS - DOSED IN MG ELEMENTAL CALCIUM) 500 MG chewable tablet Chew 1 tablet by mouth daily as needed for indigestion or heartburn.    . chlorthalidone (HYGROTON) 25 MG tablet Take 0.5 tablets (12.5 mg total) by mouth daily. 15 tablet 11  . cyclobenzaprine (FLEXERIL) 10 MG tablet Take 1 tablet (10 mg total) by mouth as needed for muscle spasms.    [name] esomeprazole (NEXIUM) 40 MG capsule Take 1 capsule (40 mg total) by mouth 2 (two) times daily before a meal. 60 capsule 5  . JANUVIA 100 MG tablet TAKE 1 Tablet BY MOUTH ONCE DAILY (Patient taking differently: Take 100 mg by mouth daily.) 90 tablet 1  . metoprolol succinate (TOPROL-XL) 50 MG 24 hr tablet Take 1 tablet (50 mg total) by mouth daily. 135 tablet 3  . nitroGLYCERIN (NITROSTAT) 0.4 MG SL tablet Place 1 tablet (0.4 mg total) under the tongue every 5 (five) minutes x 3 doses as needed for chest pain (if no relief after 3rd dose, proceed to the ED for an evaluation). 25 tablet 3  . PROVENTIL HFA 108 (90 Base) MCG/ACT inhaler INHALE 2 PUFFS BY MOUTH EVERY 6  HOURS AS NEEDED FOR COUGHING, WHEEZING, OR SHORTNESS OF BREATH 13.4 g 1  . simvastatin (ZOCOR) 20 MG tablet TAKE 1 TABLET BY MOUTH AT BEDTIME (Patient taking differently: Take 20 mg by mouth at bedtime.) 30  tablet 6  . XARELTO 20 MG TABS tablet Take 1 tablet (20 mg total) by mouth daily with supper. 90 tablet 3   No current facility-administered medications for this visit.     Musculoskeletal: Strength & Muscle Tone: within normal limits Gait & Station: normal Patient leans: N/A  Psychiatric Specialty Exam: Review of Systems  Cardiovascular: Positive for palpitations.  Musculoskeletal: Positive for arthralgias.  Psychiatric/Behavioral: The patient is nervous/anxious.   All other systems reviewed and are negative.   There were no vitals taken for this visit.There is no height or weight on file to calculate BMI.  General Appearance: NA  Eye Contact:  NA  Speech:  Clear and Coherent  Volume:  Normal  Mood:  Anxious  Affect:  NA  Thought Process:  Goal Directed  Orientation:  Full (Time, Place, and Person)  Thought Content: Rumination   Suicidal Thoughts:  No  Homicidal Thoughts:  No  Memory:  Immediate;   Good Recent;   Good Remote;   Good  Judgement:  Good  Insight:  Fair  Psychomotor Activity:  Decreased  Concentration:  Concentration: Good and Attention Span: Good  Recall:  Good  Fund of Knowledge: Good  Language: Good  Akathisia:  No  Handed:  Right  AIMS (if indicated): not done  Assets:  Communication Skills Desire for Improvement Resilience Social Support Talents/Skills  ADL's:  Intact  Cognition: WNL  Sleep:  Good   Screenings: PHQ2-9   Flowsheet Row Patient Outreach Telephone from 08/30/2015 in [place] Patient Outreach Telephone from 08/26/2015 in [place]  PHQ-2 Total Score 2 4  PHQ-9 Total Score 8 8    Flowsheet Row ED to Hosp-Admission (Discharged) from 09/30/2020 in [place] No Risk       Assessment and Plan: This patient is a 55 year old male with a history of anxiety and panic attacks.  He has continued to do well on the Xanax 0.5 mg 3 times daily and 1 mg at bedtime  for anxiety and sleep.  He will return to see me in 3 months   [name], MD 11/24/2020, 10:40 AM

## 2020-11-25 ENCOUNTER — Encounter (HOSPITAL_COMMUNITY): Payer: Self-pay | Admitting: Specialist

## 2020-11-25 ENCOUNTER — Other Ambulatory Visit: Payer: Self-pay

## 2020-11-25 ENCOUNTER — Ambulatory Visit (HOSPITAL_COMMUNITY): Payer: Self-pay | Admitting: Specialist

## 2020-11-25 ENCOUNTER — Telehealth: Payer: Self-pay | Admitting: Cardiology

## 2020-11-25 DIAGNOSIS — G8929 Other chronic pain: Secondary | ICD-10-CM

## 2020-11-25 DIAGNOSIS — R29898 Other symptoms and signs involving the musculoskeletal system: Secondary | ICD-10-CM

## 2020-11-25 DIAGNOSIS — M25612 Stiffness of left shoulder, not elsewhere classified: Secondary | ICD-10-CM

## 2020-11-25 NOTE — Telephone Encounter (Signed)
Multiple telephone encounters reviewed.  I would have him keep the scheduled visit with Jonni Sanger tomorrow so that his medications can be reviewed in detail and further discussion can be made regarding what changes may be needed.

## 2020-11-25 NOTE — Therapy (Signed)
[place], [place], [address] Phone: [phone]   Fax:  [phone]  Occupational Therapy Treatment  Patient Details  Name: Richard [name] MRN: [id number] Date of Birth: Jun 01, 1966 Referring Provider (OT): Sanjuana Kava, MD   Encounter Date: 11/25/2020   OT End of Session - 11/25/20 1656    Visit Number 7    Number of Visits 12    Date for OT Re-Evaluation 12/13/20    Authorization Type CAFA (10/04/20-04/06/21) 100% financial coverage    OT Start Time 1520    OT Stop Time 1559    OT Time Calculation (min) 39 min    Activity Tolerance Patient tolerated treatment well    Behavior During Therapy [facility] for tasks assessed/performed           Past Medical History:  Diagnosis Date  . Arthritis   . Asthma   . CAD (coronary artery disease)    a. Cardiac cath 07/2015 showed 65% distal Cx, 20% mid-distal LAD, 20% prox-distal RCA, EF 60%, EDP 77mmHg.  . Colitis 1990  . COPD (chronic obstructive pulmonary disease) ([place])   . Depression   . Essential hypertension   . Gastric ulcer 2003; 2012   2003: + esophagitis; negative H.pylori serology  2012: Dr. Oneida Alar, mild gastritis, Bravo PH probe placement, negative H.pylori  . GERD (gastroesophageal reflux disease)   . Hepatic steatosis   . History of hiatal hernia   . Hyperlipemia   . Overweight   . Panic attacks   . Paroxysmal atrial fibrillation (HCC)   . Stroke ([place])   . TIA (transient ischemic attack)   . Type II diabetes mellitus ([place])     Past Surgical History:  Procedure Laterality Date  . BRAVO [place] STUDY  05/03/2011   RXV:QMGQ gastritis/normal esophagus and duodenum  . CARDIAC CATHETERIZATION  1990s X 1; 2005; 08/12/2015  . CARDIAC CATHETERIZATION N/A 08/12/2015   Procedure: Left Heart Cath and Coronary Angiography;  Surgeon: Belva Crome, MD; LAD 20%, CFX 65%, RCA 20%, EF 60%   . COLONOSCOPY  1990  . COLONOSCOPY WITH PROPOFOL N/A 11/21/2016   Dr. Oneida Alar:  non-thrombosed external hemorrhoids, one 6 mm polyp (polypoid lesion), internal hemorrhoids. TI Normal. 10 years screening  . ESOPHAGOGASTRODUODENOSCOPY  05/03/2011   QPY:PPJK gastritis  . NECK MASS EXCISION Right    "done in dr's office; behind right ear/side of ncek"  . POLYPECTOMY  11/21/2016   Procedure: POLYPECTOMY;  Surgeon: [name], MD;  [facility];  Service: Endoscopy;;  descending colon polyp  . SHOULDER ARTHROSCOPY W/ ROTATOR CUFF REPAIR Right 2006   acromioclavicular joint arthrosis    There were no vitals filed for this visit.   Subjective Assessment - 11/25/20 1535    Subjective  S:  I have been doing the exercises today.  its a little sore    Currently in Pain? Yes    Pain Score 3     Pain Location Shoulder    Pain Orientation Left    Pain Descriptors / Indicators Aching              [facility] OT Assessment - 11/25/20 0001      Assessment   Medical Diagnosis Chronic left shoulder pain    Referring Provider (OT) Sanjuana Kava, MD      Precautions   Precautions None                    OT Treatments/Exercises (  OP) - 11/25/20 0001      Exercises   Exercises Shoulder      Shoulder Exercises: Supine   Protraction PROM;5 reps;AROM;10 reps    Horizontal ABduction PROM;5 reps;AROM;10 reps    External Rotation PROM;5 reps;AROM;10 reps    Internal Rotation PROM;5 reps;AROM;10 reps    Flexion PROM;5 reps;AROM;10 reps    ABduction PROM;5 reps;AROM;10 reps      Shoulder Exercises: Standing   Protraction AROM;5 reps    Horizontal ABduction AROM;5 reps    External Rotation AROM;5 reps    Internal Rotation AROM;5 reps    Flexion AROM;5 reps    ABduction AROM;5 reps      Shoulder Exercises: ROM/Strengthening   Proximal Shoulder Strengthening, Supine 10    Proximal Shoulder Strengthening, Seated 10    Other ROM/Strengthening Exercises seated on mat with chair on mat to his left, arm resting on chair in 90 abduction and sliding arm into  further abduction across chair and back x 3 for scapular mobility to improve greater abduction      Manual Therapy   Manual Therapy Myofascial release    Manual therapy comments completed separately from therapeutic exercises    Myofascial Release myofascial release to left upper arm, anterior shoulder, trapezius, and scapular regions to decrease pain and fascial restrictions and increase joint ROM                    OT Short Term Goals - 11/04/20 1624      OT SHORT TERM GOAL #1   Title Patient will be educated and independent with HEP in order to faciliate progress in therapy and increase the use of his LUE as his nondominant extremity for 25% or more of daily tasks.    Time 3    Period Weeks    Status On-going    Target Date 11/23/20      OT SHORT TERM GOAL #2   Title Patient will increase his LUE P/ROM to [facility] in order to increase ability to donn/doff shirts easier when dressing.    Time 3    Period Weeks    Status On-going      OT SHORT TERM GOAL #3   Title Patient will decrease his LUE fascial restrictions to mod amount in order to increase functional mobility in the left shoulder that will increase ability to complete upper body dressing.    Time 3    Period Weeks    Status On-going             OT Long Term Goals - 11/04/20 1624      OT LONG TERM GOAL #1   Title Patient will increase his LUE A/ROM to [facility] in order to complete functional reaching tasks at or above shoulder level with less difficulty.    Time 6    Period Weeks    Status On-going      OT LONG TERM GOAL #2   Title Patient will increase his LUE strength to 4/5 or better in order to return to desired leisure tasks such as fishing if possible.    Time 6    Period Weeks    Status On-going      OT LONG TERM GOAL #3   Title Patient will report a decrease in pain in the LUE of approximately 3/10 or less when completing functional ADL tasks.    Time 6    Period Weeks    Status On-going  OT  LONG TERM GOAL #4   Title Patient will decrease his LUE fascial restrictions to min amount or less in order to increase his functional mobility which is required for reaching tasks.    Time 6    Period Weeks    Status On-going                 Plan - 11/25/20 1657    Clinical Impression Statement A:  patient able to complete a/rom in supine and standing this date with most difficulty with abduction.  OT performed scapular mobilizations and then patient was able to increase available abduction with min pa from OT.    Body Structure / Function / Physical Skills ADL;UE functional use;Fascial restriction;Pain;ROM;Strength;Mobility;IADL    Plan P:  increase available a/rom in seated abduction to [facility], continue slide on chair to assist with scapualr mobilization.           Patient will benefit from skilled therapeutic intervention in order to improve the following deficits and impairments:   Body Structure / Function / Physical Skills: ADL,UE functional use,Fascial restriction,Pain,ROM,Strength,Mobility,IADL       Visit Diagnosis: Stiffness of left shoulder, not elsewhere classified  Chronic left shoulder pain  Other symptoms and signs involving the musculoskeletal system    Problem List Patient Active Problem List   Diagnosis Date Noted  . Paroxysmal atrial fibrillation (HCC)   . Lateral epicondylitis, left elbow 03/27/2019  . Globus sensation 02/21/2018  . Other cervical disc degeneration, unspecified cervical region 01/11/2018  . Tendinitis of right triceps 01/11/2018  . Neck pain 12/27/2017  . Lateral epicondylitis, right elbow 05/10/2017  . Constipation 12/24/2015  . Dyspnea 11/17/2015  . Coronary artery disease due to lipid rich plaque   . Heart palpitations 08/12/2015  . TIA (transient ischemic attack) 08/12/2015  . Colon cancer screening 08/02/2015  . Abdominal pain 12/18/2014  . Encounter for screening colonoscopy 12/18/2014  . Unspecified vitamin D  deficiency 08/20/2012  . Arteriosclerotic cardiovascular disease (ASCVD) 04/11/2012  . Chronic low back pain   . Essential hypertension   . Anxiety and depression   . Controlled diabetes mellitus type 2 with complications ([place]) 00/76/2263  . Cigarette smoker 09/14/2010  . CHRONIC OBSTRUCTIVE PULMONARY DISEASE 09/14/2010  . HLD (hyperlipidemia) 11/25/2009  . GERD (gastroesophageal reflux disease) 04/05/2009  . Hepatic steatosis 04/05/2009    Vangie Bicker, [place], OTR/L [phone]  11/25/2020, 5:01 PM  [facility], [place], [address] Phone: [phone]   Fax:  [phone]  Name: Richard [name] MRN: [id number] Date of Birth: 12-22-1965

## 2020-11-25 NOTE — Telephone Encounter (Signed)
Pt c/o dizziness, headache, blurred vision, thoracic pressure, and presyncope episodes. Pt stated that since taking chlorthalidone, patient is experiencing elevated blood pressures. Pt also admitted that he had not increased his fluid intake since starting Chlorthalidone 12.5 mg tablets QD.  Pt called EMS yesterday, who checked out pt and conducted an ekg. Pt said EMS said ekg "looked good". Pt did not go to the ED.   Pt is still currently taking:  Amlodipine 10 mg tablets QD (was supposed to switch this to 5 mg tablets, but never picked up new prescription- 11/15/2020 note) Metoprolol Succinate 50 mg tablets QD Chlorthalidone 12.5 mg tablets QD  Please advise.

## 2020-11-25 NOTE — Telephone Encounter (Signed)
New message    Pt c/o BP issue: STAT if pt c/o blurred vision, one-sided weakness or slurred speech  1. What are your last 5 BP readings? Last night 135/105 other arm is 174/105 they called EMS , when ems arrived it was 160/90 he took a xanax and it lowered it to 137/80 , this morning it was 129/83  2. Are you having any other symptoms (ex. Dizziness, headache, blurred vision, passed out)? Not feeling well   3. What is your BP issue? bp is not controlled on new medication , patient did not know that the new med had diuretic in it and has not increased his fluids

## 2020-11-25 NOTE — Progress Notes (Signed)
Cardiology Office Note  Date: 11/26/2020   ID: Richard [name], DOB Apr 16, 1966, MRN [id number]  PCP:  Richard Dryer, PA-C  Cardiologist:  Richard Lesches, MD Electrophysiologist:  None   Chief Complaint: BP elevated  History of Present Illness: Richard [name] is a 55 y.o. male with a history of CAD, COPD, HTN, hepatic steatosis, hiatal hernia, HLD, TIA, DM2, gastric ulcers . Last seen by Dr. Domenic [name] on 11/02/2020 to establish care.  Been hospitalized in March for suspected TIA.  Neuro imaging did not reveal any acute findings.  EF was normal by echocardiogram with negative bubble study.  Carotid Doppler study did not reveal any significant obstructive stenosis.  His beta-blocker dosage was reduced due to bradycardia.  He reported dizziness since starting on lisinopril 10 mg daily.  He had held the medication for the prior few days.  Blood pressure was within normal range.  He reported intermittent palpitations.  He had been compliant with his Xarelto.  He was continuing Toprol-XL and Xarelto for PAF.  He was continuing statin therapy.  Recent LDL was 66.  Lisinopril dose was decreased to 5 mg daily.  He is here today with complaints of elevated blood pressures at home on his home blood pressure cuff.  He states he is taking his antihypertensive medications at night.  Medications include amlodipine 10 mg daily, chlorthalidone 12.5 mg daily, Toprol-XL 50 mg daily.  He continues to smoke.  States she smokes occasionally about 1 cigarette/day.  He denies any other issues i.e. anginal or exertional symptoms, palpitations or arrhythmias, orthostatic symptoms, CVA or TIA-like symptoms, PND, orthopnea, bleeding, claudication, DVT or PE-like symptoms, or lower extremity edema.  He is concerned his blood pressure machine is not accurately measuring his blood pressures.  He states he is on his third blood pressure machine.  We rechecked his blood pressure on his machine after sitting and BP in right arm  was 148/87.  Using our Dinamap machine blood pressure was 128/82.  He states his blood pressure was well controlled on lisinopril but it was stopped due to a dry hacking cough.  Past Medical History:  Diagnosis Date  . Arthritis   . Asthma   . CAD (coronary artery disease)    a. Cardiac cath 07/2015 showed 65% distal Cx, 20% mid-distal LAD, 20% prox-distal RCA, EF 60%, EDP 30mmHg.  . Colitis 1990  . COPD (chronic obstructive pulmonary disease) ([place])   . Depression   . Essential hypertension   . Gastric ulcer 2003; 2012   2003: + esophagitis; negative H.pylori serology  2012: Dr. Oneida [name], mild gastritis, Bravo PH probe placement, negative H.pylori  . GERD (gastroesophageal reflux disease)   . Hepatic steatosis   . History of hiatal hernia   . Hyperlipemia   . Overweight   . Panic attacks   . Paroxysmal atrial fibrillation (HCC)   . Stroke ([place])   . TIA (transient ischemic attack)   . Type II diabetes mellitus ([place])     Past Surgical History:  Procedure Laterality Date  . BRAVO [place] STUDY  05/03/2011   [id number] gastritis/normal esophagus and duodenum  . CARDIAC CATHETERIZATION  1990s X 1; 2005; 08/12/2015  . CARDIAC CATHETERIZATION N/A 08/12/2015   Procedure: Left Heart Cath and Coronary Angiography;  Surgeon: Richard Crome, MD; LAD 20%, CFX 65%, RCA 20%, EF 60%   . COLONOSCOPY  1990  . COLONOSCOPY WITH PROPOFOL N/A 11/21/2016   Dr. Oneida [name]: non-thrombosed external hemorrhoids, one 6 mm polyp (polypoid lesion),  internal hemorrhoids. TI Normal. 10 years screening  . ESOPHAGOGASTRODUODENOSCOPY  05/03/2011   UDJ:SHFW gastritis  . NECK MASS EXCISION Right    "done in dr's office; behind right ear/side of ncek"  . POLYPECTOMY  11/21/2016   Procedure: POLYPECTOMY;  Surgeon: Richard Binder, MD;  Location: [facility];  Service: Endoscopy;;  descending colon polyp  . SHOULDER ARTHROSCOPY W/ ROTATOR CUFF REPAIR Right 2006   acromioclavicular joint arthrosis    Current Outpatient  Medications  Medication Sig Dispense Refill  . ALPRAZolam (XANAX) 0.5 MG tablet Take one tablet by mouth three times a day and two tablets at night 150 tablet 2  . aluminum-magnesium hydroxide 200-200 MG/5ML suspension Take by mouth every 6 (six) hours as needed for indigestion.    [name] amLODipine (NORVASC) 10 MG tablet Take 10 mg by mouth daily.    . budesonide-formoterol (SYMBICORT) 80-4.5 MCG/ACT inhaler Inhale 2 puffs into the lungs 2 (two) times daily as needed.    . calcium carbonate (TUMS - DOSED IN MG ELEMENTAL CALCIUM) 500 MG chewable tablet Chew 1 tablet by mouth daily as needed for indigestion or heartburn.    . chlorthalidone (HYGROTON) 25 MG tablet Take 0.5 tablets (12.5 mg total) by mouth daily. 15 tablet 11  . cyclobenzaprine (FLEXERIL) 10 MG tablet Take 1 tablet (10 mg total) by mouth as needed for muscle spasms.    [name] esomeprazole (NEXIUM) 40 MG capsule Take 1 capsule (40 mg total) by mouth 2 (two) times daily before a meal. 60 capsule 5  . JANUVIA 100 MG tablet TAKE 1 Tablet BY MOUTH ONCE DAILY (Patient taking differently: Take 100 mg by mouth daily.) 90 tablet 1  . metoprolol succinate (TOPROL-XL) 50 MG 24 hr tablet Take 1 tablet (50 mg total) by mouth daily. 135 tablet 3  . nitroGLYCERIN (NITROSTAT) 0.4 MG SL tablet Place 1 tablet (0.4 mg total) under the tongue every 5 (five) minutes x 3 doses as needed for chest pain (if no relief after 3rd dose, proceed to the ED for an evaluation). 25 tablet 3  . PROVENTIL HFA 108 (90 Base) MCG/ACT inhaler INHALE 2 PUFFS BY MOUTH EVERY 6 HOURS AS NEEDED FOR COUGHING, WHEEZING, OR SHORTNESS OF BREATH 13.4 g 1  . simvastatin (ZOCOR) 20 MG tablet TAKE 1 TABLET BY MOUTH AT BEDTIME (Patient taking differently: Take 20 mg by mouth at bedtime.) 30 tablet 6  . XARELTO 20 MG TABS tablet Take 1 tablet (20 mg total) by mouth daily with supper. 90 tablet 3   No current facility-administered medications for this visit.   Allergies:  Dexilant  [dexlansoprazole], Mushroom ext cmplx(shiitake-reishi-mait), Penicillins, and Doxycycline   Social History: The patient  reports that he has been smoking cigarettes. He started smoking about 38 years ago. He has a 6.25 pack-year smoking history. He has never used smokeless tobacco. He reports that he does not drink alcohol and does not use drugs.   Family History: The patient's family history includes ADD / ADHD in his daughter; Alcohol abuse in his father and mother; Anxiety disorder in his sister and sister; Dementia in his cousin and paternal uncle; Depression in his sister; Diabetes in his brother and father; Heart attack (age of onset: 38) in his brother; Heart attack (age of onset: 97) in his father; Hypertension in his brother, brother, and brother; Lung cancer in his mother; Seizures in his brother.   ROS:  Please see the history of present illness. Otherwise, complete review of systems is positive for  none.  All other systems are reviewed and negative.   Physical Exam: VS:  BP 128/72   Pulse (!) 54   Ht 5\' 11"  (1.803 m)   Wt 189 lb 12.8 oz (86.1 kg)   SpO2 96%   BMI 26.47 kg/m , BMI Body mass index is 26.47 kg/m.  Wt Readings from Last 3 Encounters:  11/26/20 189 lb 12.8 oz (86.1 kg)  11/22/20 190 lb (86.2 kg)  11/02/20 174 lb (78.9 kg)    General: Patient appears comfortable at rest. Neck: Supple, no elevated JVP or carotid bruits, no thyromegaly. Lungs: Clear to auscultation, nonlabored breathing at rest. Cardiac: Regular rate and rhythm, no S3 or significant systolic murmur, no pericardial rub. Extremities: No pitting edema, distal pulses 2+. Skin: Warm and dry. Musculoskeletal: No kyphosis. Neuropsychiatric: Alert and oriented x3, affect grossly appropriate.  ECG:  [facility]/30/2020 [facility], ED.  Sinus bradycardia rate of 59  Recent Labwork: 09/30/2020: ALT 18; AST 14; Hemoglobin 15.3; Platelets 183 10/01/2020: TSH 3.119 10/29/2020: BUN 15; Creatinine, Ser 1.01;  Magnesium 2.0; Potassium 3.6; Sodium 138     Component Value Date/Time   CHOL 112 10/01/2020 0353   TRIG 78 10/01/2020 0353   HDL 30 (L) 10/01/2020 0353   CHOLHDL 3.7 10/01/2020 0353   VLDL 16 10/01/2020 0353   LDLCALC 66 10/01/2020 0353    Other Studies Reviewed Today:  Carotid Dopplers 10/01/2020: IMPRESSION: 1. Mild (1-49%) stenosis proximal right internal carotid artery secondary to trace smooth heterogeneous atherosclerotic plaque. 2. No significant atherosclerotic plaque or evidence of stenosis in the left internal carotid artery. 3. Vertebral arteries are patent with normal antegrade flow.  Echocardiogram 10/01/2020: 1. Left ventricular ejection fraction, by estimation, is 65 to 70%. The  left ventricle has normal function. The left ventricle has no regional  wall motion abnormalities. Left ventricular diastolic parameters were  normal.  2. Right ventricular systolic function is normal. The right ventricular  size is normal. Tricuspid regurgitation signal is inadequate for assessing  PA pressure.  3. The mitral valve is grossly normal. No evidence of mitral valve  regurgitation.  4. The aortic valve is tricuspid. Aortic valve regurgitation is not  visualized.  5. The inferior vena cava is normal in size with <50% respiratory  variability, suggesting right atrial pressure of 8 mmHg.  6. Agitated saline contrast bubble study was negative, with no evidence  of any interatrial shunt.   Assessment and Plan:  1. Essential hypertension   2. Paroxysmal atrial fibrillation (HCC)   3. CAD in native artery   4. Mixed hyperlipidemia   5. TIA (transient ischemic attack)     1. Essential hypertension Complaining of recent elevated blood pressures at home on his home blood pressure monitor.  States he recently had significant elevated blood pressure and took a Xanax.  He called EMS who came to his home.  His wife states after checking several blood pressures with  elevated blood pressure had decreased down to the 130s over 70s range.  He currently takes his antihypertensive medications at night.  I advised him to start taking his antihypertensive medications in a.m. advised him only to check blood pressures approximately 2 hours after taking the medications to more accurately discern if medications are working.  He states sometimes his blood pressures are elevated in the evening hours 2.  I advised him he could split the amlodipine in half and take 5 mg in the a.m. and 5 mg in p.m.  He states he  feels dehydrated when he takes the chlorthalidone.  Advised him to stop the chlorthalidone for 1 week and see how he feels and make sure the blood pressures are still controlled.  Start taking Toprol-XL 50 mg in a.m., start taking amlodipine 5 mg in a.m. and 5 mg in p.m.  2. Paroxysmal atrial fibrillation (HCC) Denies any palpitations or arrhythmias.  Continue Toprol-XL 50 mg daily.  Continue Xarelto 20 mg daily.  3. CAD in native artery Is any anginal or exertional symptoms.  Continue nitroglycerin 0.4 mg sublingual.  Continue amlodipine, Toprol-XL.  Not on aspirin due to systemic anticoagulation on Xarelto.  4. Mixed hyperlipidemia Continue simvastatin 20 mg daily.  5. TIA (transient ischemic attack) No CVA or TIA-like symptoms.  Continue Xarelto 20 mg daily.  Medication Adjustments/Labs and Tests Ordered: Current medicines are reviewed at length with the patient today.  Concerns regarding medicines are outlined above.   Disposition: Follow-up with Dr. Domenic [name] or APP 3 months.  Signed, Richard July, NP 11/26/2020 10:44 AM    [facility] [place], [place], [place] [address] Phone: [phone]; Fax: [phone]

## 2020-11-25 NOTE — Telephone Encounter (Signed)
Pt and wife verbalized agreement to keep appointment with Lawanda Cousins, NP at the [facility] office on 11/26/2020

## 2020-11-26 ENCOUNTER — Encounter: Payer: Self-pay | Admitting: Family Medicine

## 2020-11-26 ENCOUNTER — Ambulatory Visit (INDEPENDENT_AMBULATORY_CARE_PROVIDER_SITE_OTHER): Payer: Self-pay | Admitting: Family Medicine

## 2020-11-26 VITALS — BP 128/72 | HR 54 | Ht 71.0 in | Wt 189.8 lb

## 2020-11-26 DIAGNOSIS — G459 Transient cerebral ischemic attack, unspecified: Secondary | ICD-10-CM

## 2020-11-26 DIAGNOSIS — E782 Mixed hyperlipidemia: Secondary | ICD-10-CM

## 2020-11-26 DIAGNOSIS — I2510 Atherosclerotic heart disease of native coronary artery without angina pectoris: Secondary | ICD-10-CM

## 2020-11-26 DIAGNOSIS — I480 Paroxysmal atrial fibrillation: Secondary | ICD-10-CM

## 2020-11-26 DIAGNOSIS — I10 Essential (primary) hypertension: Secondary | ICD-10-CM

## 2020-11-26 NOTE — Patient Instructions (Addendum)
Medication Instructions:   HOLD CHLORTHALIDONE FOR 1 WEEK - CALL us WITH UPDATE   TAKE TOPROL XL 50 MG IN THE MORNING   TAKE AMLODIPINE 5 MG IN THE MORNING AND 5 MG IN THE EVENING   CHECK BLOOD PRESSURE 2 HOURS AFTER TAKING MEDICATIONS    Labwork: NONE    Testing/Procedures:NONE    Follow-Up: 3 MONTHS WITH Richard Dung, NP   Any Other Special Instructions Will Be Listed Below (If Applicable).  If you need a refill on your cardiac medications before your next appointment, please call your pharmacy.

## 2020-11-29 ENCOUNTER — Telehealth (HOSPITAL_COMMUNITY): Payer: Self-pay

## 2020-11-29 ENCOUNTER — Ambulatory Visit (HOSPITAL_COMMUNITY): Payer: Self-pay

## 2020-11-29 ENCOUNTER — Telehealth: Payer: Self-pay | Admitting: Physician Assistant

## 2020-11-29 NOTE — Telephone Encounter (Signed)
Returned call from patients wife stating Richard [name] was not feeling well and wanted an appointment. After calling back and speaking with [name]. The patient stated he felt like he had a knot in his chest and with exercise the pain got worse. This had been going on for several days. Patient  was advised that what he was explaining could be chest pain and was advised to go to ER to be checked out.

## 2020-11-29 NOTE — Telephone Encounter (Signed)
Pt is havingj Medical Issues and will not be here

## 2020-12-01 NOTE — Progress Notes (Signed)
Cardiology Office Note  Date: 12/02/2020   ID: Arvel, Oquinn 10-Oct-1965, MRN [id number]  PCP:  [name], PA-C  Cardiologist:  Rozann Lesches, MD Electrophysiologist:  None   Chief Complaint  Patient presents with  . Cardiac follow-up    History of Present Illness: Richard [name] is a 55 y.o. male seen just recently on May 13 by Mr. Leonides Sake NP, I reviewed the note.  I met him for the first time in April, he is a former patient of Dr. Bronson Ing.  He presents today with his wife for follow-up.  He reports having a recurring "knot" sensation in his mid to lower sternal area with activity such as walking or carrying in groceries.  This has been going on for at least a few weeks.  Still mentions fluctuations in blood pressure, but admits that he is anxious and does check his blood pressure several times during the day.  He does seem to be tolerating the present medications however and his blood pressure today is well controlled.  He has a history of largely nonobstructive CAD with moderately diseased distal circumflex as of 2017.  No recent ischemic testing.  I did talk with him about arranging a follow-up stress test.  He is also trying to cut back smoking cigarettes.  Past Medical History:  Diagnosis Date  . Arthritis   . Asthma   . CAD (coronary artery disease)    a. Cardiac cath 07/2015 showed 65% distal Cx, 20% mid-distal LAD, 20% prox-distal RCA, EF 60%, EDP 79mHg.  . Colitis 1990  . COPD (chronic obstructive pulmonary disease) ([place]   . Depression   . Essential hypertension   . Gastric ulcer 2003; 2012   2003: + esophagitis; negative H.pylori serology  2012: [name] mild gastritis, Bravo PH probe placement, negative H.pylori  . GERD (gastroesophageal reflux disease)   . Hepatic steatosis   . History of hiatal hernia   . Hyperlipemia   . Overweight   . Panic attacks   . Paroxysmal atrial fibrillation (HCC)   . [place] ([place]   . TIA (transient ischemic  attack)   . Type II diabetes mellitus ([place]     Past Surgical History:  Procedure Laterality Date  . BRAVO [place]  05/03/2011   SXBD:ZHGDgastritis/normal esophagus and duodenum  . CARDIAC CATHETERIZATION  1990s X 1; 2005; 08/12/2015  . CARDIAC CATHETERIZATION N/A 08/12/2015   Procedure: Left Heart Cath and Coronary Angiography;  Surgeon: HBelva Crome MD; LAD 20%, CFX 65%, RCA 20%, EF 60%   . COLONOSCOPY  1990  . COLONOSCOPY WITH PROPOFOL N/A 11/21/2016   [name] non-thrombosed external hemorrhoids, one 6 mm polyp (polypoid lesion), internal hemorrhoids. TI Normal. 10 years screening  . ESOPHAGOGASTRODUODENOSCOPY  05/03/2011   SJME:QASTgastritis  . NECK MASS EXCISION Right    "done in dr's office; behind right ear/side of ncek"  . POLYPECTOMY  11/21/2016   Procedure: POLYPECTOMY;  Surgeon: FDanie Binder MD;  Location: [facility] SU[facility];  Service: Endoscopy;;  descending colon polyp  . SHOULDER ARTHROSCOPY W/ ROTATOR CUFF REPAIR Right 2006   acromioclavicular joint arthrosis    Current Outpatient Medications  Medication Sig Dispense Refill  . ALPRAZolam (XANAX) 0.5 MG tablet Take one tablet by mouth three times a day and two tablets at night 150 tablet 2  . aluminum-magnesium hydroxide 200-200 MG/5ML suspension Take by mouth every 6 (six) hours as needed for indigestion.    .Marland KitchenamLODipine (NORVASC) 10 MG  tablet Take 10 mg by mouth daily. TAKE 1/2 TABLET IN THE MORNING AND 1/2 TABLET IN THE EVENING    . budesonide-formoterol (SYMBICORT) 80-4.5 MCG/ACT inhaler Inhale 2 puffs into the lungs 2 (two) times daily as needed.    . calcium carbonate (TUMS - DOSED IN MG ELEMENTAL CALCIUM) 500 MG chewable tablet Chew 1 tablet by mouth daily as needed for indigestion or heartburn.    . chlorthalidone (HYGROTON) 25 MG tablet Take 0.5 tablets (12.5 mg total) by mouth daily. 15 tablet 11  . cyclobenzaprine (FLEXERIL) 10 MG tablet Take 1 tablet (10 mg total) by mouth as needed for muscle spasms.    [name]  esomeprazole (NEXIUM) 40 MG capsule Take 1 capsule (40 mg total) by mouth 2 (two) times daily before a meal. 60 capsule 5  . JANUVIA 100 MG tablet TAKE 1 Tablet BY MOUTH ONCE DAILY (Patient taking differently: Take 100 mg by mouth daily.) 90 tablet 1  . metoprolol succinate (TOPROL-XL) 50 MG 24 hr tablet Take 1 tablet (50 mg total) by mouth daily. 135 tablet 3  . nitroGLYCERIN (NITROSTAT) 0.4 MG SL tablet Place 1 tablet (0.4 mg total) under the tongue every 5 (five) minutes x 3 doses as needed for chest pain (if no relief after 3rd dose, proceed to the ED for an evaluation). 25 tablet 3  . PROVENTIL HFA 108 (90 Base) MCG/ACT inhaler INHALE 2 PUFFS BY MOUTH EVERY 6 HOURS AS NEEDED FOR COUGHING, WHEEZING, OR SHORTNESS OF BREATH 13.4 g 1  . simvastatin (ZOCOR) 20 MG tablet TAKE 1 TABLET BY MOUTH AT BEDTIME (Patient taking differently: Take 20 mg by mouth at bedtime.) 30 tablet 6  . XARELTO 20 MG TABS tablet Take 1 tablet (20 mg total) by mouth daily with supper. 90 tablet 3   No current facility-administered medications for this visit.   Allergies:  Dexilant [dexlansoprazole], Mushroom ext cmplx(shiitake-reishi-mait), Penicillins, and Doxycycline   ROS: No palpitations or syncope.  Physical Exam: VS:  BP 128/64   Pulse (!) 51   Ht '5\' 11"'  (1.803 m)   Wt 190 lb (86.2 kg)   SpO2 95%   BMI 26.50 kg/m , BMI Body mass index is 26.5 kg/m.  Wt Readings from Last 3 Encounters:  12/02/20 190 lb (86.2 kg)  11/26/20 189 lb 12.8 oz (86.1 kg)  11/22/20 190 lb (86.2 kg)    General: Patient appears comfortable at rest. HEENT: Conjunctiva and lids normal, wearing a mask. Neck: Supple, no elevated JVP or carotid bruits, no thyromegaly. Lungs: Clear to auscultation, nonlabored breathing at rest. Cardiac: Regular rate and rhythm, no S3 or significant systolic murmur, no pericardial rub. Extremities: No pitting edema, distal pulses 2+.  ECG:  An ECG dated 09/30/2020 was personally reviewed today and  demonstrated:  Sinus bradycardia.  Recent Labwork: 09/30/2020: ALT 18; AST 14; Hemoglobin 15.3; Platelets 183 10/01/2020: TSH 3.119 10/29/2020: BUN 15; Creatinine, Ser 1.01; Magnesium 2.0; Potassium 3.6; Sodium 138     Component Value Date/Time   CHOL 112 10/01/2020 0353   TRIG 78 10/01/2020 0353   HDL 30 (L) 10/01/2020 0353   CHOLHDL 3.7 10/01/2020 0353   VLDL 16 10/01/2020 0353   LDLCALC 66 10/01/2020 0353    Other Studies Reviewed Today:  Carotid Dopplers 10/01/2020: IMPRESSION: 1. Mild (1-49%) stenosis proximal right internal carotid artery secondary to trace smooth heterogeneous atherosclerotic plaque. 2. No significant atherosclerotic plaque or evidence of stenosis in the left internal carotid artery. 3. Vertebral arteries are patent with normal antegrade  flow.  Echocardiogram 10/01/2020: 1. Left ventricular ejection fraction, by estimation, is 65 to 70%. The  left ventricle has normal function. The left ventricle has no regional  wall motion abnormalities. Left ventricular diastolic parameters were  normal.  2. Right ventricular systolic function is normal. The right ventricular  size is normal. Tricuspid regurgitation signal is inadequate for assessing  PA pressure.  3. The mitral valve is grossly normal. No evidence of mitral valve  regurgitation.  4. The aortic valve is tricuspid. Aortic valve regurgitation is not  visualized.  5. The inferior vena cava is normal in size with <50% respiratory  variability, suggesting right atrial pressure of 8 mmHg.  6. Agitated saline contrast bubble study was negative, with no evidence  of any interatrial shunt.   Assessment and Plan:  1.  CAD with symptoms suggestive of recurring angina.  He had nonobstructive disease with moderately stenosed distal circumflex in 2017.  LVEF 65 to 70% by echocardiogram in March.  We will plan a follow-up Lexiscan Myoview to assess ischemic burden.  For now would continue Zocor, Toprol-XL,  and Norvasc.  He is not on aspirin given use of Xarelto.  2.  Paroxysmal atrial fibrillation with CHA2DS2-VASc score of 5.  No increasing palpitations and heart rate is regular today.  Continue Xarelto for stroke prophylaxis.  3.  Acquired thrombophilia, on Xarelto for stroke prophylaxis.  I reviewed his lab work from April.  No spontaneous bleeding problems.  4.  Essential hypertension, currently tolerating Norvasc, Toprol-XL, and chlorthalidone.  Blood pressure today is reasonably well controlled.  Medication Adjustments/Labs and Tests Ordered: Current medicines are reviewed at length with the patient today.  Concerns regarding medicines are outlined above.   Tests Ordered: Orders Placed This Encounter  Procedures  . NM Myocar Multi W/Spect W/Wall Motion / EF    Medication Changes: No orders of the defined types were placed in this encounter.   Disposition:  Follow up test results.  Signed, Satira Sark, MD, [facility] 12/02/2020 9:46 AM    [facility] [place], [place], [place] [address] Phone: [phone]; Fax: [phone]

## 2020-12-02 ENCOUNTER — Encounter: Payer: Self-pay | Admitting: *Deleted

## 2020-12-02 ENCOUNTER — Ambulatory Visit (HOSPITAL_COMMUNITY): Payer: Self-pay | Admitting: Occupational Therapy

## 2020-12-02 ENCOUNTER — Encounter: Payer: Self-pay | Admitting: Cardiology

## 2020-12-02 ENCOUNTER — Ambulatory Visit (INDEPENDENT_AMBULATORY_CARE_PROVIDER_SITE_OTHER): Payer: Self-pay | Admitting: Cardiology

## 2020-12-02 VITALS — BP 128/64 | HR 51 | Ht 71.0 in | Wt 190.0 lb

## 2020-12-02 DIAGNOSIS — I480 Paroxysmal atrial fibrillation: Secondary | ICD-10-CM

## 2020-12-02 DIAGNOSIS — I10 Essential (primary) hypertension: Secondary | ICD-10-CM

## 2020-12-02 DIAGNOSIS — I25119 Atherosclerotic heart disease of native coronary artery with unspecified angina pectoris: Secondary | ICD-10-CM

## 2020-12-02 NOTE — Patient Instructions (Addendum)
Medication Instructions:  ?Your physician recommends that you continue on your current medications as directed. Please refer to the Current Medication list given to you today. ? ?Labwork: ?none ? ?Testing/Procedures: ?Your physician has requested that you have a lexiscan myoview. For further information please visit [email or web address]. Please follow instruction sheet, as given. ? ?Follow-Up: ?Your physician recommends that you schedule a follow-up appointment in: pending ? ?Any Other Special Instructions Will Be Listed Below (If Applicable). ? ?If you need a refill on your cardiac medications before your next appointment, please call your pharmacy. ?

## 2020-12-07 ENCOUNTER — Ambulatory Visit (HOSPITAL_COMMUNITY): Payer: Self-pay | Admitting: Occupational Therapy

## 2020-12-07 ENCOUNTER — Encounter: Payer: Self-pay | Admitting: Orthopaedic Surgery

## 2020-12-07 ENCOUNTER — Ambulatory Visit (INDEPENDENT_AMBULATORY_CARE_PROVIDER_SITE_OTHER): Payer: Self-pay | Admitting: Orthopaedic Surgery

## 2020-12-07 ENCOUNTER — Encounter (HOSPITAL_COMMUNITY): Payer: Self-pay | Admitting: Occupational Therapy

## 2020-12-07 ENCOUNTER — Other Ambulatory Visit: Payer: Self-pay

## 2020-12-07 VITALS — BP 120/76 | HR 58 | Ht 71.0 in | Wt 189.5 lb

## 2020-12-07 DIAGNOSIS — G8929 Other chronic pain: Secondary | ICD-10-CM

## 2020-12-07 DIAGNOSIS — M25512 Pain in left shoulder: Secondary | ICD-10-CM

## 2020-12-07 DIAGNOSIS — M25612 Stiffness of left shoulder, not elsewhere classified: Secondary | ICD-10-CM

## 2020-12-07 DIAGNOSIS — R29898 Other symptoms and signs involving the musculoskeletal system: Secondary | ICD-10-CM

## 2020-12-07 DIAGNOSIS — F17210 Nicotine dependence, cigarettes, uncomplicated: Secondary | ICD-10-CM

## 2020-12-07 NOTE — Progress Notes (Signed)
I am better  He has been going to OT for his left shoulder.  He is making slow but steady progress.  His pain is less.  He is more sore now.  His motion is improving.  He has no new trauma.  I have reviewed the OT notes.  NV intact.  Motion is much better.  Encounter Diagnoses  Name Primary?  . Chronic left shoulder pain Yes  . Nicotine dependence, cigarettes, uncomplicated    Continue OT.  Return in three weeks.  Call if any problem.  Precautions discussed.   Electronically Signed Sanjuana Kava, MD 5/24/202211:05 AM

## 2020-12-07 NOTE — Patient Instructions (Signed)

## 2020-12-07 NOTE — Therapy (Signed)
[place], [place], [address] Phone: [phone]   Fax:  [phone]  Occupational Therapy Treatment  Patient Details  Name: Richard [name] MRN: [id number] Date of Birth: [birth year] Referring Provider (OT): Sanjuana Kava, MD   Encounter Date: 12/07/2020   OT End of Session - 12/07/20 1426    Visit Number 8    Number of Visits 12    Date for OT Re-Evaluation 12/13/20    Authorization Type CAFA (10/04/20-04/06/21) 100% financial coverage    OT Start Time 1347    OT Stop Time 1426    OT Time Calculation (min) 39 min    Activity Tolerance Patient tolerated treatment well    Behavior During Therapy [facility] for tasks assessed/performed           Past Medical History:  Diagnosis Date  . Arthritis   . Asthma   . CAD (coronary artery disease)    a. Cardiac cath 07/2015 showed 65% distal Cx, 20% mid-distal LAD, 20% prox-distal RCA, EF 60%, EDP 46mmHg.  . Colitis 1990  . COPD (chronic obstructive pulmonary disease) ([place])   . Depression   . Essential hypertension   . Gastric ulcer 2003; 2012   2003: + esophagitis; negative H.pylori serology  2012: Dr. Oneida Alar, mild gastritis, Bravo PH probe placement, negative H.pylori  . GERD (gastroesophageal reflux disease)   . Hepatic steatosis   . History of hiatal hernia   . Hyperlipemia   . Overweight   . Panic attacks   . Paroxysmal atrial fibrillation (HCC)   . Stroke ([place])   . TIA (transient ischemic attack)   . Type II diabetes mellitus ([place])     Past Surgical History:  Procedure Laterality Date  . BRAVO [place] STUDY  05/03/2011   NIO:EVOJ gastritis/normal esophagus and duodenum  . CARDIAC CATHETERIZATION  1990s X 1; 2005; 08/12/2015  . CARDIAC CATHETERIZATION N/A 08/12/2015   Procedure: Left Heart Cath and Coronary Angiography;  Surgeon: Belva Crome, MD; LAD 20%, CFX 65%, RCA 20%, EF 60%   . COLONOSCOPY  1990  . COLONOSCOPY WITH PROPOFOL N/A 11/21/2016   Dr. Oneida Alar:  non-thrombosed external hemorrhoids, one 6 mm polyp (polypoid lesion), internal hemorrhoids. TI Normal. 10 years screening  . ESOPHAGOGASTRODUODENOSCOPY  05/03/2011   JKK:XFGH gastritis  . NECK MASS EXCISION Right    "done in dr's office; behind right ear/side of ncek"  . POLYPECTOMY  11/21/2016   Procedure: POLYPECTOMY;  Surgeon: [name], MD;  [facility];  Service: Endoscopy;;  descending colon polyp  . SHOULDER ARTHROSCOPY W/ ROTATOR CUFF REPAIR Right 2006   acromioclavicular joint arthrosis    There were no vitals filed for this visit.   Subjective Assessment - 12/07/20 1346    Subjective  S: The shoulder has been flared up since it started raining.    Currently in Pain? Yes    Pain Score 3     Pain Location Shoulder    Pain Orientation Left    Pain Descriptors / Indicators Aching;Sore    Pain Type Acute pain    Pain Radiating Towards N/A    Pain Onset In the past 7 days    Pain Frequency Constant    Aggravating Factors  rain    Pain Relieving Factors rest    Effect of Pain on Daily Activities mod effect on ADLs    Multiple Pain Sites No  [facility] OT Assessment - 12/07/20 1346      Assessment   Medical Diagnosis Chronic left shoulder pain      Precautions   Precautions None                    OT Treatments/Exercises (OP) - 12/07/20 1349      Exercises   Exercises Shoulder      Shoulder Exercises: Supine   Protraction PROM;5 reps;AROM;10 reps    Horizontal ABduction PROM;5 reps;AROM;10 reps    External Rotation PROM;5 reps;AROM;10 reps    Internal Rotation PROM;5 reps;AROM;10 reps    Flexion PROM;5 reps;AROM;10 reps    ABduction PROM;5 reps;AROM;10 reps      Shoulder Exercises: Standing   Protraction AROM;10 reps    Horizontal ABduction AROM;10 reps    External Rotation AROM;10 reps    Internal Rotation AROM;10 reps    Flexion AROM;10 reps    ABduction AROM;10 reps      Shoulder Exercises: ROM/Strengthening    Proximal Shoulder Strengthening, Supine 10X each, no rest breaks      Manual Therapy   Manual Therapy Myofascial release    Manual therapy comments completed separately from therapeutic exercises    Myofascial Release myofascial release to left upper arm, anterior shoulder, trapezius, and scapular regions to decrease pain and fascial restrictions and increase joint ROM                  OT Education - 12/07/20 1415    Education Details shoulder A/ROM    Person(s) Educated Patient    Methods Explanation;Demonstration;Handout    Comprehension Verbalized understanding;Returned demonstration            OT Short Term Goals - 11/04/20 1624      OT SHORT TERM GOAL #1   Title Patient will be educated and independent with HEP in order to faciliate progress in therapy and increase the use of his LUE as his nondominant extremity for 25% or more of daily tasks.    Time 3    Period Weeks    Status On-going    Target Date 11/23/20      OT SHORT TERM GOAL #2   Title Patient will increase his LUE P/ROM to [facility] in order to increase ability to donn/doff shirts easier when dressing.    Time 3    Period Weeks    Status On-going      OT SHORT TERM GOAL #3   Title Patient will decrease his LUE fascial restrictions to mod amount in order to increase functional mobility in the left shoulder that will increase ability to complete upper body dressing.    Time 3    Period Weeks    Status On-going             OT Long Term Goals - 11/04/20 1624      OT LONG TERM GOAL #1   Title Patient will increase his LUE A/ROM to [facility] in order to complete functional reaching tasks at or above shoulder level with less difficulty.    Time 6    Period Weeks    Status On-going      OT LONG TERM GOAL #2   Title Patient will increase his LUE strength to 4/5 or better in order to return to desired leisure tasks such as fishing if possible.    Time 6    Period Weeks    Status On-going      OT LONG TERM  GOAL #3   Title Patient will report a decrease in pain in the LUE of approximately 3/10 or less when completing functional ADL tasks.    Time 6    Period Weeks    Status On-going      OT LONG TERM GOAL #4   Title Patient will decrease his LUE fascial restrictions to min amount or less in order to increase his functional mobility which is required for reaching tasks.    Time 6    Period Weeks    Status On-going                 Plan - 12/07/20 1414    Clinical Impression Statement A: Pt reports he has been completing his HEP and saw the doctor yesterday who is pleased with his progress. Continued with myofascial release and passive stretching, pt with ROM WFL. Continued with A/ROM supine and standing, proximal shoulder strengthening. Verbal cuing for form and technique.    Body Structure / Function / Physical Skills ADL;UE functional use;Fascial restriction;Pain;ROM;Strength;Mobility;IADL    Plan P: A/ROM seated or standing, proximal shoulder strengthening in standing, abduction wall stretch, functional reaching task    OT Home Exercise Plan eval: table slides, seated A/ROM scapular row.    Consulted and Agree with Plan of Care Patient           Patient will benefit from skilled therapeutic intervention in order to improve the following deficits and impairments:   Body Structure / Function / Physical Skills: ADL,UE functional use,Fascial restriction,Pain,ROM,Strength,Mobility,IADL       Visit Diagnosis: Stiffness of left shoulder, not elsewhere classified  Chronic left shoulder pain  Other symptoms and signs involving the musculoskeletal system    Problem List Patient Active Problem List   Diagnosis Date Noted  . Paroxysmal atrial fibrillation (HCC)   . Lateral epicondylitis, left elbow 03/27/2019  . Globus sensation 02/21/2018  . Other cervical disc degeneration, unspecified cervical region 01/11/2018  . Tendinitis of right triceps 01/11/2018  . Neck pain  12/27/2017  . Lateral epicondylitis, right elbow 05/10/2017  . Constipation 12/24/2015  . Dyspnea 11/17/2015  . Coronary artery disease due to lipid rich plaque   . Heart palpitations 08/12/2015  . TIA (transient ischemic attack) 08/12/2015  . Colon cancer screening 08/02/2015  . Abdominal pain 12/18/2014  . Encounter for screening colonoscopy 12/18/2014  . Unspecified vitamin D deficiency 08/20/2012  . Arteriosclerotic cardiovascular disease (ASCVD) 04/11/2012  . Chronic low back pain   . Essential hypertension   . Anxiety and depression   . Controlled diabetes mellitus type 2 with complications ([place]) 62/37/6283  . Cigarette smoker 09/14/2010  . CHRONIC OBSTRUCTIVE PULMONARY DISEASE 09/14/2010  . HLD (hyperlipidemia) 11/25/2009  . GERD (gastroesophageal reflux disease) 04/05/2009  . Hepatic steatosis 04/05/2009   Guadelupe Sabin, OTR/L  [phone] 12/07/2020, 2:27 PM  [place], [place], [address] Phone: [phone]   Fax:  [phone]  Name: Richard [name] MRN: [id number] Date of Birth: 1966/04/26

## 2020-12-07 NOTE — Patient Instructions (Signed)

## 2020-12-08 ENCOUNTER — Telehealth (HOSPITAL_COMMUNITY): Payer: Self-pay | Admitting: Psychiatry

## 2020-12-09 ENCOUNTER — Encounter (HOSPITAL_COMMUNITY): Payer: Self-pay | Admitting: Occupational Therapy

## 2020-12-09 ENCOUNTER — Other Ambulatory Visit: Payer: Self-pay

## 2020-12-09 ENCOUNTER — Ambulatory Visit (HOSPITAL_COMMUNITY): Payer: Self-pay | Admitting: Occupational Therapy

## 2020-12-09 DIAGNOSIS — M25612 Stiffness of left shoulder, not elsewhere classified: Secondary | ICD-10-CM

## 2020-12-09 DIAGNOSIS — R29898 Other symptoms and signs involving the musculoskeletal system: Secondary | ICD-10-CM

## 2020-12-09 DIAGNOSIS — M25512 Pain in left shoulder: Secondary | ICD-10-CM

## 2020-12-09 DIAGNOSIS — G8929 Other chronic pain: Secondary | ICD-10-CM

## 2020-12-09 NOTE — Patient Instructions (Signed)
  1) Flexion Wall Stretch    Face wall, place affected handon wall in front of you. Slide hand up the wall  and lean body in towards the wall. Hold for 20 seconds. Repeat 3-5 times. 1-2 times/day.     2) Towel Stretch with Internal Rotation       Gently pull up (or to the side) your affected arm  behind your back with the assist of a towel. Hold 20 seconds, repeat 3-5 times. 1-2 times/day.             3) Corner Stretch    Stand at a corner of a wall, place your arms on the walls with elbows bent. Lean into the corner until a stretch is felt along the front of your chest and/or shoulders. Hold for 20 seconds. Repeat 3-5X, 1-2 times/day.      4) External Rotation Stretch:     Place your affected hand on the wall with the elbow bent and gently turn your body the opposite direction until a stretch is felt. Hold 20 seconds, repeat 3-5X. Complete 1-2 times/day.    5) Shoulder Abduction Wall Stretch  While standing next to wall, place your affected arm as pictured, then lean towards wall to stretch shoulder.  Hold 20 seconds, repeat 3-5X. Complete 1-2 times/day.

## 2020-12-09 NOTE — Therapy (Signed)
[place], [place], [address] Phone: [phone]   Fax:  [phone]  Occupational Therapy Treatment  Patient Details  Name: Richard [name] MRN: [id number] Date of Birth: [birth year] Referring Provider (OT): Sanjuana Kava, MD   Encounter Date: 12/09/2020   OT End of Session - 12/09/20 1512    Visit Number 9    Number of Visits 12    Date for OT Re-Evaluation 12/13/20    Authorization Type CAFA (10/04/20-04/06/21) 100% financial coverage    OT Start Time 1429    OT Stop Time 1509    OT Time Calculation (min) 40 min    Activity Tolerance Patient tolerated treatment well    Behavior During Therapy [facility] for tasks assessed/performed           Past Medical History:  Diagnosis Date  . Arthritis   . Asthma   . CAD (coronary artery disease)    a. Cardiac cath 07/2015 showed 65% distal Cx, 20% mid-distal LAD, 20% prox-distal RCA, EF 60%, EDP 20mmHg.  . Colitis 1990  . COPD (chronic obstructive pulmonary disease) ([place])   . Depression   . Essential hypertension   . Gastric ulcer 2003; 2012   2003: + esophagitis; negative H.pylori serology  2012: Dr. Oneida Alar, mild gastritis, Bravo PH probe placement, negative H.pylori  . GERD (gastroesophageal reflux disease)   . Hepatic steatosis   . History of hiatal hernia   . Hyperlipemia   . Overweight   . Panic attacks   . Paroxysmal atrial fibrillation (HCC)   . Stroke ([place])   . TIA (transient ischemic attack)   . Type II diabetes mellitus ([place])     Past Surgical History:  Procedure Laterality Date  . BRAVO [place] STUDY  05/03/2011   HUT:MLYY gastritis/normal esophagus and duodenum  . CARDIAC CATHETERIZATION  1990s X 1; 2005; 08/12/2015  . CARDIAC CATHETERIZATION N/A 08/12/2015   Procedure: Left Heart Cath and Coronary Angiography;  Surgeon: Belva Crome, MD; LAD 20%, CFX 65%, RCA 20%, EF 60%   . COLONOSCOPY  1990  . COLONOSCOPY WITH PROPOFOL N/A 11/21/2016   Dr. Oneida Alar:  non-thrombosed external hemorrhoids, one 6 mm polyp (polypoid lesion), internal hemorrhoids. TI Normal. 10 years screening  . ESOPHAGOGASTRODUODENOSCOPY  05/03/2011   TKP:TWSF gastritis  . NECK MASS EXCISION Right    "done in dr's office; behind right ear/side of ncek"  . POLYPECTOMY  11/21/2016   Procedure: POLYPECTOMY;  Surgeon: [name], MD;  [facility];  Service: Endoscopy;;  descending colon polyp  . SHOULDER ARTHROSCOPY W/ ROTATOR CUFF REPAIR Right 2006   acromioclavicular joint arthrosis    There were no vitals filed for this visit.   Subjective Assessment - 12/09/20 1432    Subjective  S: I can tell there is rain coming because I woke up hurting this morning.    Currently in Pain? Yes    Pain Score 2     Pain Location Shoulder    Pain Orientation Left    Pain Descriptors / Indicators Sore;Aching    Pain Type Acute pain    Pain Radiating Towards N/A    Pain Onset Today    Pain Frequency Constant    Aggravating Factors  rain    Pain Relieving Factors rest, hot water    Effect of Pain on Daily Activities mod effect on ADLs    Multiple Pain Sites No  [facility] OT Assessment - 12/09/20 1432      Assessment   Medical Diagnosis Chronic left shoulder pain      Precautions   Precautions None                    OT Treatments/Exercises (OP) - 12/09/20 1433      Exercises   Exercises Shoulder      Shoulder Exercises: Supine   Protraction PROM;5 reps;AROM;10 reps    Horizontal ABduction PROM;5 reps;AROM;10 reps    External Rotation PROM;5 reps;AROM;10 reps    Internal Rotation PROM;5 reps;AROM;10 reps    Flexion PROM;5 reps;AROM;10 reps    ABduction PROM;5 reps;AROM;10 reps      Shoulder Exercises: Standing   Horizontal ABduction AROM;10 reps    External Rotation AROM;10 reps    Flexion AROM;10 reps    ABduction AROM;10 reps    Extension Theraband;10 reps    Theraband Level (Shoulder Extension) Level 2 (Red)    Row  Theraband;10 reps    Theraband Level (Shoulder Row) Level 2 (Red)    Retraction Theraband;10 reps    Theraband Level (Shoulder Retraction) Level 2 (Red)      Shoulder Exercises: ROM/Strengthening   Proximal Shoulder Strengthening, Supine 10X each, no rest breaks    Other ROM/Strengthening Exercises ball pass behind back for IR, 10X using tennis ball      Shoulder Exercises: Stretch   Internal Rotation Stretch 2 reps   20" holds, horizontal towel   External Rotation Stretch 2 reps;20 seconds    Wall Stretch - Flexion 2 reps;20 seconds    Wall Stretch - ABduction 2 reps;20 seconds    Other Shoulder Stretches doorway stretch, 2x20"      Manual Therapy   Manual Therapy Myofascial release    Manual therapy comments completed separately from therapeutic exercises    Myofascial Release myofascial release to left upper arm, anterior shoulder, trapezius, and scapular regions to decrease pain and fascial restrictions and increase joint ROM                  OT Education - 12/09/20 1502    Education Details shoulder stretches    Person(s) Educated Patient    Methods Explanation;Demonstration;Handout    Comprehension Verbalized understanding;Returned demonstration            OT Short Term Goals - 11/04/20 1624      OT SHORT TERM GOAL #1   Title Patient will be educated and independent with HEP in order to faciliate progress in therapy and increase the use of his LUE as his nondominant extremity for 25% or more of daily tasks.    Time 3    Period Weeks    Status On-going    Target Date 11/23/20      OT SHORT TERM GOAL #2   Title Patient will increase his LUE P/ROM to [facility] in order to increase ability to donn/doff shirts easier when dressing.    Time 3    Period Weeks    Status On-going      OT SHORT TERM GOAL #3   Title Patient will decrease his LUE fascial restrictions to mod amount in order to increase functional mobility in the left shoulder that will increase ability to  complete upper body dressing.    Time 3    Period Weeks    Status On-going             OT Long Term Goals - 11/04/20 1624  OT LONG TERM GOAL #1   Title Patient will increase his LUE A/ROM to [facility] in order to complete functional reaching tasks at or above shoulder level with less difficulty.    Time 6    Period Weeks    Status On-going      OT LONG TERM GOAL #2   Title Patient will increase his LUE strength to 4/5 or better in order to return to desired leisure tasks such as fishing if possible.    Time 6    Period Weeks    Status On-going      OT LONG TERM GOAL #3   Title Patient will report a decrease in pain in the LUE of approximately 3/10 or less when completing functional ADL tasks.    Time 6    Period Weeks    Status On-going      OT LONG TERM GOAL #4   Title Patient will decrease his LUE fascial restrictions to min amount or less in order to increase his functional mobility which is required for reaching tasks.    Time 6    Period Weeks    Status On-going                 Plan - 12/09/20 1457    Clinical Impression Statement A: Pt with minimal fascial restrictions this date, continued with passive stretching and A/ROM. Added shoulder stretches today, ball pass for IR behind back. Continued with A/ROM in standing, scapular theraband. Pt has ROM WFL with exception of abduction, is unable to achieve more than 50% ROM in standing, reports he feels like something is blocking his arm. Verbal cuing for form and technique, tactile cuing to depress shoulders during ROM.    Body Structure / Function / Physical Skills ADL;UE functional use;Fascial restriction;Pain;ROM;Strength;Mobility;IADL    Plan P: Reassessment, follow up on HEP, pt is self-pay, discuss if wants to continue and decrease to 1x/week or if comfortable discharging with HEP    OT Home Exercise Plan eval: table slides, seated A/ROM scapular row. 5/24: shoulder A/ROM    Consulted and Agree with Plan of  Care Patient           Patient will benefit from skilled therapeutic intervention in order to improve the following deficits and impairments:   Body Structure / Function / Physical Skills: ADL,UE functional use,Fascial restriction,Pain,ROM,Strength,Mobility,IADL       Visit Diagnosis: Stiffness of left shoulder, not elsewhere classified  Chronic left shoulder pain  Other symptoms and signs involving the musculoskeletal system    Problem List Patient Active Problem List   Diagnosis Date Noted  . Paroxysmal atrial fibrillation (HCC)   . Lateral epicondylitis, left elbow 03/27/2019  . Globus sensation 02/21/2018  . Other cervical disc degeneration, unspecified cervical region 01/11/2018  . Tendinitis of right triceps 01/11/2018  . Neck pain 12/27/2017  . Lateral epicondylitis, right elbow 05/10/2017  . Constipation 12/24/2015  . Dyspnea 11/17/2015  . Coronary artery disease due to lipid rich plaque   . Heart palpitations 08/12/2015  . TIA (transient ischemic attack) 08/12/2015  . Colon cancer screening 08/02/2015  . Abdominal pain 12/18/2014  . Encounter for screening colonoscopy 12/18/2014  . Unspecified vitamin D deficiency 08/20/2012  . Arteriosclerotic cardiovascular disease (ASCVD) 04/11/2012  . Chronic low back pain   . Essential hypertension   . Anxiety and depression   . Controlled diabetes mellitus type 2 with complications ([place]) 39/09/90  . Cigarette smoker 09/14/2010  . CHRONIC OBSTRUCTIVE PULMONARY DISEASE 09/14/2010  .  HLD (hyperlipidemia) 11/25/2009  . GERD (gastroesophageal reflux disease) 04/05/2009  . Hepatic steatosis 04/05/2009   Guadelupe Sabin, OTR/L  [phone] 12/09/2020, 3:15 PM  [place], [place], [address] Phone: [phone]   Fax:  [phone]  Name: Richard [name] MRN: [id number] Date of Birth: 06-23-1966

## 2020-12-14 ENCOUNTER — Ambulatory Visit (HOSPITAL_COMMUNITY): Payer: Self-pay

## 2020-12-16 ENCOUNTER — Ambulatory Visit (HOSPITAL_COMMUNITY): Payer: Self-pay

## 2020-12-20 NOTE — Progress Notes (Addendum)
Referring Provider: [name], PA-C Primary Care Physician:  [name], PA-C Primary GI: Dr. Abbey Chatters  Chief Complaint  Patient presents with   globus sensation    Still ongoing, states it will cause AFIB    HPI:   BRIAN ZEITLIN is a 55 y.o. male presenting today with a history of chronic GERD, abdominal pain, dysphagia, and constipation. 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 Last EGDin 2012.   GERD: Nexium once to BID. Using coupons. Says Nexium is only thing that helps. States he doesn't have "indigestion" but is moreso regurgitating.   Has stress test scheduled this week for chest pain and HTN. Every time eating something, he feels a globus sensation. Feels irirtated in esophagus. Stopped eating anything "good". Feels like this sets off his afib. States he feels a knot in the bottom of his esophagus and sets his afib off. Worried about mold on ceiling. Pain in upper abdomen as well.   Bowels are moving daily but feels like not moving properly. Sometimes straining. Eats cheerios, frosted mini wheats. Sips on caffeine free pepsi. One swallow and will fizz up.    Past Medical History:  Diagnosis Date   Arthritis    Asthma    CAD (coronary artery disease)    a. Cardiac cath 07/2015 showed 65% distal Cx, 20% mid-distal LAD, 20% prox-distal RCA, EF 60%, EDP 23mmHg.   Colitis 1990   COPD (chronic obstructive pulmonary disease) ([place])    Depression    Essential hypertension    Gastric ulcer 2003; 2012   2003: + esophagitis; negative H.pylori serology  2012: Dr. Oneida Alar, mild gastritis, Bravo PH probe placement, negative H.pylori   GERD (gastroesophageal reflux disease)    Hepatic steatosis    History of hiatal hernia    Hyperlipemia    Overweight    Panic attacks    Paroxysmal atrial fibrillation (HCC)    Stroke ([place])    TIA (transient ischemic attack)    Type II diabetes mellitus ([place])     Past Surgical History:  Procedure  Laterality Date   BRAVO [facility] STUDY  05/03/2011   WNI:OEVO gastritis/normal esophagus and duodenum   CARDIAC CATHETERIZATION  1990s X 1; 2005; 08/12/2015   CARDIAC CATHETERIZATION N/A 08/12/2015   Procedure: Left Heart Cath and Coronary Angiography;  Surgeon: Belva Crome, MD; LAD 20%, CFX 65%, RCA 20%, EF 60%    COLONOSCOPY  1990   COLONOSCOPY WITH PROPOFOL N/A 11/21/2016   Dr. Oneida Alar: non-thrombosed external hemorrhoids, one 6 mm polyp (polypoid lesion), internal hemorrhoids. TI Normal. 10 years screening   ESOPHAGOGASTRODUODENOSCOPY  05/03/2011   JJK:KXFG gastritis   NECK MASS EXCISION Right    "done in dr's office; behind right ear/side of ncek"   POLYPECTOMY  11/21/2016   Procedure: POLYPECTOMY;  Surgeon: [name], MD;  Location[facility];  Service: Endoscopy;;  descending colon polyp   SHOULDER ARTHROSCOPY W/ ROTATOR CUFF REPAIR Right 2006   acromioclavicular joint arthrosis    Current Outpatient Medications  Medication Sig Dispense Refill   ALPRAZolam (XANAX) 0.5 MG tablet Take one tablet by mouth three times a day and two tablets at night 150 tablet 2   aluminum-magnesium hydroxide 200-200 MG/5ML suspension Take by mouth every 6 (six) hours as needed for indigestion.     amLODipine (NORVASC) 10 MG tablet Take 10 mg by mouth daily.     budesonide-formoterol (SYMBICORT) 80-4.5 MCG/ACT inhaler Inhale 2 puffs into the lungs 2 (two) times  daily as needed.     cyclobenzaprine (FLEXERIL) 10 MG tablet Take 1 tablet (10 mg total) by mouth as needed for muscle spasms.     esomeprazole (NEXIUM) 40 MG capsule Take 1 capsule (40 mg total) by mouth 2 (two) times daily before a meal. 60 capsule 5   JANUVIA 100 MG tablet TAKE 1 Tablet BY MOUTH ONCE DAILY (Patient taking differently: Take 100 mg by mouth daily.) 90 tablet 1   metoprolol succinate (TOPROL-XL) 50 MG 24 hr tablet Take 1 tablet (50 mg total) by mouth daily. 135 tablet 3   nitroGLYCERIN (NITROSTAT) 0.4 MG SL tablet Place 1 tablet  (0.4 mg total) under the tongue every 5 (five) minutes x 3 doses as needed for chest pain (if no relief after 3rd dose, proceed to the ED for an evaluation). 25 tablet 3   PROVENTIL HFA 108 (90 Base) MCG/ACT inhaler INHALE 2 PUFFS BY MOUTH EVERY 6 HOURS AS NEEDED FOR COUGHING, WHEEZING, OR SHORTNESS OF BREATH 13.4 g 1   simvastatin (ZOCOR) 20 MG tablet TAKE 1 TABLET BY MOUTH AT BEDTIME (Patient taking differently: Take 20 mg by mouth at bedtime.) 30 tablet 6   XARELTO 20 MG TABS tablet Take 1 tablet (20 mg total) by mouth daily with supper. 90 tablet 3   No current facility-administered medications for this visit.    Allergies as of 12/21/2020 - Review Complete 12/21/2020  Allergen Reaction Noted   Dexilant [dexlansoprazole] Anaphylaxis 01/20/2015   Mushroom ext cmplx(shiitake-reishi-mait) Anaphylaxis 03/22/2011   Penicillins Anaphylaxis    Doxycycline Nausea And Vomiting 04/18/2016    Family History  Problem Relation Age of Onset   Lung cancer Mother    Alcohol abuse Mother    Heart attack Father 27   Diabetes Father    Alcohol abuse Father    Hypertension Brother    Hypertension Brother    Anxiety disorder Sister    Depression Sister    Anxiety disorder Sister    Heart attack Brother 14   Diabetes Brother    Hypertension Brother    Seizures Brother    Dementia Paternal Uncle    Dementia Cousin    ADD / ADHD Daughter    Colon cancer Neg Hx    Drug abuse Neg Hx    Bipolar disorder Neg Hx    OCD Neg Hx    Paranoid behavior Neg Hx    Schizophrenia Neg Hx    Sexual abuse Neg Hx    Physical abuse Neg Hx     Social History   Socioeconomic History   Marital status: Married    Spouse name: Not on file   Number of children: Not on file   Years of education: Not on file   Highest education level: Not on file  Occupational History   Occupation: full time    Employer: UNEMPLOYED  Tobacco Use   Smoking status: Current Every Day Smoker    Packs/day: 0.25    Years: 25.00     Pack years: 6.25    Types: Cigarettes    Start date: 07/17/1982   Smokeless tobacco: Never Used  Vaping Use   Vaping Use: Never used  Substance and Sexual Activity   Alcohol use: No    Alcohol/week: 0.0 standard drinks   Drug use: No   Sexual activity: Yes    Birth control/protection: None  Other Topics Concern   Not on file  Social History Narrative   Pt lives in [place] with wife.  5 children.  Unemployed due to panic attacks and back pain   Social Determinants of Health   Financial Resource Strain: Not on file  Food Insecurity: Not on file  Transportation Needs: Not on file  Physical Activity: Not on file  Stress: Not on file  Social Connections: Not on file    Review of Systems: Gen: Denies fever, chills, anorexia. Denies fatigue, weakness, weight loss.  CV: Denies chest pain, palpitations, syncope, peripheral edema, and claudication. Resp: Denies dyspnea at rest, cough, wheezing, coughing up blood, and pleurisy. GI: see HPI Derm: Denies rash, itching, dry skin Psych: Denies depression, anxiety, memory loss, confusion. No homicidal or suicidal ideation.  Heme: Denies bruising, bleeding, and enlarged lymph nodes.  Physical Exam: BP (!) 154/90   Pulse 65   Temp (!) 97.1 F (36.2 C)   Ht 5\' 11"  (1.803 m)   Wt 192 lb 12.8 oz (87.5 kg)   BMI 26.89 kg/m  General:   Alert and oriented. No distress noted. Pleasant and cooperative.  Head:  Normocephalic and atraumatic. Eyes:  Conjuctiva clear without scleral icterus. Mouth:  Mask in place Cardiac: S1 S2 present, regular Lungs: clear bilaterally Abdomen:  +BS, soft, TTP upper abdomen and LUQ and non-distended. No rebound or guarding. No HSM or masses noted. Msk:  Symmetrical without gross deformities. Normal posture. Extremities:  Without edema. Neurologic:  Alert and  oriented x4 Psych:  Alert and cooperative. Normal mood and affect.  ASSESSMENT: ROMOLO SIELING is a 54 y.o. male presenting today with a   history of chronic GERD, abdominal pain, dysphagia, and constipation. 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 Last EGD in 2012. Colonoscopy up-to-date and due in 2028.  Now with globus sensation, reporting "regurgitating" but seems to happen moreso with carbonated beverages, which we discussed avoiding. He has been on Nexium BID for many years and gets this OTC, taking 40 mg BID; he declines changing agents. Limited finances have made difficult to obtain, and we discussed again different agents that may be more cost-effective but he would like to hold off on this currently.   As it has been some time since an EGD, will pursue EGD +/- dilation in near future. We discussed dietary/behavior modifications as well.     PLAN:  Proceed with upper endoscopy/dilation by Dr. Abbey Chatters in near future: the risks, benefits, and alternatives have been discussed with the patient in detail. The patient states understanding and desires to proceed.   HOLD XARELTO 48 hours prior: discussing with cardiology  Continue Nexium BID  Avoid carbonated beverages  Return in 6-8 months  Colonoscopy 2028  Annitta Needs, PhD, ANP-BC [facility]    Addendum: Cardiology approved holding only for 24 hours. Discussed with patient the risks and benefits. We discussed increased risk of bleeding as well. He is agreeable to this.   Annitta Needs, PhD, ANP-BC [facility]

## 2020-12-21 ENCOUNTER — Ambulatory Visit (INDEPENDENT_AMBULATORY_CARE_PROVIDER_SITE_OTHER): Payer: Self-pay | Admitting: Gastroenterology

## 2020-12-21 ENCOUNTER — Telehealth: Payer: Self-pay

## 2020-12-21 ENCOUNTER — Other Ambulatory Visit: Payer: Self-pay

## 2020-12-21 ENCOUNTER — Encounter: Payer: Self-pay | Admitting: Gastroenterology

## 2020-12-21 VITALS — BP 154/90 | HR 65 | Temp 97.1°F | Ht 71.0 in | Wt 192.8 lb

## 2020-12-21 DIAGNOSIS — R198 Other specified symptoms and signs involving the digestive system and abdomen: Secondary | ICD-10-CM

## 2020-12-21 DIAGNOSIS — R0989 Other specified symptoms and signs involving the circulatory and respiratory systems: Secondary | ICD-10-CM

## 2020-12-21 NOTE — Telephone Encounter (Signed)
Patient with diagnosis of afib on Xarelto for anticoagulation.    Procedure: EGD with dilation Date of procedure: TBD  CHA2DS2-VASc Score = 5  This indicates a 7.2% annual risk of stroke. The patient's score is based upon: CHF History: No HTN History: Yes Diabetes History: Yes Stroke History: Yes Vascular Disease History: Yes Age Score: 0 Gender Score: 0   CrCl >134mL/min Platelet count 183K  Request is for 2 day hold, however recommend holding Xarelto for 1 day prior to procedure given elevated cardiovascular risk off of anticoagulation due to hx of afib and stroke. If > 1 day hold is required, will need MD clearance.

## 2020-12-21 NOTE — Progress Notes (Signed)
Cc'ed to pcp °

## 2020-12-21 NOTE — Telephone Encounter (Signed)
This patient needs to be scheduled for an EGD with dilation with Dr.Carver. is it ok to hold his xarelto for 48 hours prior to the procedure?  Thanks.

## 2020-12-21 NOTE — Patient Instructions (Signed)
We are arranging an upper endoscopy with dilation if needed in the near future.  We will stop Xarelto 2 days prior.   I recommend avoiding any carbonated beverages!  We will see you in 6-8 months!  Have a great birthday next week!  I enjoyed seeing you again today! As you know, I value our relationship and want to provide genuine, compassionate, and quality care. I welcome your feedback. If you receive a survey regarding your visit,  I greatly appreciate you taking time to fill this out. See you next time!  Annitta Needs, PhD, ANP-BC [facility]

## 2020-12-21 NOTE — Telephone Encounter (Signed)
   [facility] Pre-operative Risk Assessment    Patient Name: Richard [name]  DOB: June 21, 1966  MRN: [id number]   HEARTCARE STAFF: - Please ensure there is not already an duplicate clearance open for this procedure. - Under Visit Info/Reason for Call, type in Other and utilize the format Clearance MM/DD/YY or Clearance TBD. Do not use dashes or single digits. - If request is for dental extraction, please clarify the # of teeth to be extracted. - If the patient is currently at the dentist's office, call Pre-Op APP to address. If the patient is not currently in the dentist office, please route to the Pre-Op pool  Request for surgical clearance:  1. What type of surgery is being performed?    2. When is this surgery scheduled?    3. What type of clearance is required (medical clearance vs. Pharmacy clearance to hold med vs. Both)?   4. Are there any medications that need to be held prior to surgery and how long?   5. Practice name and name of physician performing surgery?    6. What is the office phone number?    7.   What is the office fax number?   8.   Anesthesia type (None, local, MAC, general) ?    Jeanann Lewandowsky 12/21/2020, 11:20 AM  _________________________________________________________________   (provider comments below)

## 2020-12-21 NOTE — Telephone Encounter (Signed)
1) EGD with dilation  2) procedure has not been scheduled yet 3) Hold xarelto for 48 hours 4) yes, xarelto for 48 hours 5) [facility]  Dr.Carver 6) [phone] 7) [phone] 8) propofol

## 2020-12-22 ENCOUNTER — Ambulatory Visit: Payer: Self-pay | Admitting: Diagnostic Neuroimaging

## 2020-12-23 ENCOUNTER — Encounter (HOSPITAL_COMMUNITY)
Admission: RE | Admit: 2020-12-23 | Discharge: 2020-12-23 | Disposition: A | Discharge status: Home / Self Care | Payer: Self-pay | Source: Ambulatory Visit | Attending: Cardiology | Admitting: Cardiology

## 2020-12-23 ENCOUNTER — Encounter (HOSPITAL_BASED_OUTPATIENT_CLINIC_OR_DEPARTMENT_OTHER)
Admission: RE | Admit: 2020-12-23 | Discharge: 2020-12-23 | Disposition: A | Discharge status: Home / Self Care | Payer: Self-pay | Source: Ambulatory Visit | Attending: Cardiology | Admitting: Cardiology

## 2020-12-23 DIAGNOSIS — I25119 Atherosclerotic heart disease of native coronary artery with unspecified angina pectoris: Secondary | ICD-10-CM | POA: Insufficient documentation

## 2020-12-23 LAB — NM MYOCAR MULTI W/SPECT W/WALL MOTION / EF
LV dias vol: 106 mL (ref 62–150)
LV sys vol: 38 mL
Peak HR: 90 {beats}/min
RATE: 0.35
Rest HR: 51 {beats}/min
SDS: 0
SRS: 1
SSS: 1
TID: 1.18

## 2020-12-23 MED ORDER — SODIUM CHLORIDE FLUSH 0.9 % IV SOLN
INTRAVENOUS | Status: AC
  Administered 2020-12-23: 10 mL via INTRAVENOUS
  Filled 2020-12-23: qty 10

## 2020-12-23 MED ORDER — TECHNETIUM TC 99M TETROFOSMIN IV KIT
10.0000 | PACK | Freq: Once | INTRAVENOUS | Status: AC | PRN
  Administered 2020-12-23: 10.8 via INTRAVENOUS

## 2020-12-23 MED ORDER — TECHNETIUM TC 99M TETROFOSMIN IV KIT
30.0000 | PACK | Freq: Once | INTRAVENOUS | Status: AC | PRN
  Administered 2020-12-23: 30.8 via INTRAVENOUS

## 2020-12-23 MED ORDER — REGADENOSON 0.4 MG/5ML IV SOLN
INTRAVENOUS | Status: AC
  Administered 2020-12-23: 0.4 mg via INTRAVENOUS
  Filled 2020-12-23: qty 5

## 2020-12-24 ENCOUNTER — Telehealth: Payer: Self-pay | Admitting: *Deleted

## 2020-12-24 ENCOUNTER — Encounter (HOSPITAL_COMMUNITY): Payer: Self-pay

## 2020-12-24 ENCOUNTER — Other Ambulatory Visit: Payer: Self-pay

## 2020-12-24 ENCOUNTER — Ambulatory Visit (HOSPITAL_COMMUNITY): Payer: Self-pay | Attending: Orthopaedic Surgery

## 2020-12-24 DIAGNOSIS — M25512 Pain in left shoulder: Secondary | ICD-10-CM | POA: Insufficient documentation

## 2020-12-24 DIAGNOSIS — M25612 Stiffness of left shoulder, not elsewhere classified: Secondary | ICD-10-CM | POA: Insufficient documentation

## 2020-12-24 DIAGNOSIS — R29898 Other symptoms and signs involving the musculoskeletal system: Secondary | ICD-10-CM | POA: Insufficient documentation

## 2020-12-24 DIAGNOSIS — G8929 Other chronic pain: Secondary | ICD-10-CM | POA: Insufficient documentation

## 2020-12-24 NOTE — Telephone Encounter (Signed)
Patient informed. Copy sent to PCP °

## 2020-12-24 NOTE — Telephone Encounter (Signed)
-----   Message from Satira Sark, MD sent at 12/23/2020  5:03 PM EDT ----- Results reviewed.  Please let him know that stress test results were overall reassuring.  No definite ischemic territories to suggest progressive CAD at this point.  We will continue with current medications and keep follow-up over the next 3 months.

## 2020-12-24 NOTE — Patient Instructions (Signed)

## 2020-12-24 NOTE — Telephone Encounter (Signed)
   Primary Cardiologist: Rozann Lesches, MD  Chart reviewed as part of pre-operative protocol coverage. Given past medical history and time since last visit, based on ACC/AHA guidelines, Richard [name] would be at acceptable risk for the planned procedure without further cardiovascular testing.   Patient with diagnosis of afib on Xarelto for anticoagulation.     Procedure: EGD with dilation Date of procedure: TBD   CHA2DS2-VASc Score = 5  This indicates a 7.2% annual risk of stroke. The patient's score is based upon: CHF History: No HTN History: Yes Diabetes History: Yes Stroke History: Yes Vascular Disease History: Yes Age Score: 0 Gender Score: 0   CrCl >165mL/min Platelet count 183K   Request is for 2 day hold, however recommend holding Xarelto for 1 day prior to procedure given elevated cardiovascular risk off of anticoagulation due to hx of afib and stroke.  I will route this recommendation to the requesting party via Epic fax function and remove from pre-op pool.  Please call with questions.  Jossie Ng. Money Mckeithan NP-C    12/24/2020, 8:03 AM [place] [address] Office [phone] Fax [phone]

## 2020-12-24 NOTE — Therapy (Signed)
[place], [place], [address] Phone: [phone]   Fax:  [phone]  Occupational Therapy Treatment Reassessment/discharge summary Patient Details  Name: Richard [name] MRN: [id number] Date of Birth: March 08, 1966 Referring Provider (OT): Sanjuana Kava, MD   Encounter Date: 12/24/2020   OT End of Session - 12/24/20 1116     Visit Number 10    Number of Visits 12    Date for OT Re-Evaluation --    Authorization Type CAFA (10/04/20-04/06/21) 100% financial coverage    OT Start Time 1030    OT Stop Time 1113    OT Time Calculation (min) 43 min    Activity Tolerance Patient tolerated treatment well    Behavior During Therapy [facility] for tasks assessed/performed             Past Medical History:  Diagnosis Date   Arthritis    Asthma    CAD (coronary artery disease)    a. Cardiac cath 07/2015 showed 65% distal Cx, 20% mid-distal LAD, 20% prox-distal RCA, EF 60%, EDP 22mHg.   Colitis 1990   COPD (chronic obstructive pulmonary disease) ([place]    Depression    Essential hypertension    Gastric ulcer 2003; 2012   2003: + esophagitis; negative H.pylori serology  2012: [name] mild gastritis, Bravo PH probe placement, negative H.pylori   GERD (gastroesophageal reflux disease)    Hepatic steatosis    History of hiatal hernia    Hyperlipemia    Overweight    Panic attacks    Paroxysmal atrial fibrillation (HCC)    Stroke ([place]    TIA (transient ischemic attack)    Type II diabetes mellitus ([place]     Past Surgical History:  Pro[facility]   [facility]   SLKG:MWNUgastritis/normal esophagus and duodenum   CARDIAC CATHETERIZATION  1990s X 1; 2005; 08/12/2015   CARDIAC CATHETERIZATION N/A 08/12/2015   Procedure: Left Heart Cath and Coronary Angiography;  Surgeon: HBelva Crome MD; LAD 20%, CFX 65%, RCA 20%, EF 60%    COLONOSCOPY  1990   COLONOSCOPY WITH PROPOFOL N/A 11/21/2016   Dr.  FOneida Alar non-thrombosed external hemorrhoids, one 6 mm polyp (polypoid lesion), internal hemorrhoids. TI Normal. 10 years screening   ESOPHAGOGASTRODUODENOSCOPY  05/03/2011   SUVO:ZDGUgastritis   NECK MASS EXCISION Right    "done in dr's office; behind right ear/side of ncek"   POLYPECTOMY  11/21/2016   Procedure: POLYPECTOMY;  Surgeon: FDanie Binder MD;  [facility];  Service: Endoscopy;;  descending colon polyp   SHOULDER ARTHROSCOPY W/ ROTATOR CUFF REPAIR Right 2006   acromioclavicular joint arthrosis    There were no vitals filed for this visit.   Subjective Assessment - 12/24/20 1035     Subjective  S: I can fold my arms across my chest now and I couldn't do that before.    Currently in Pain? Yes    Pain Score 1     Pain Location Shoulder    Pain Orientation Left    Pain Descriptors / Indicators Sore;Aching    Pain Type Acute pain    Pain Onset Today    Pain Frequency Occasional    Aggravating Factors  increased use and activities    Pain Relieving Factors rest, hot water    Effect of Pain on Daily Activities min-mod effect    Multiple Pain Sites No  [facility] OT Assessment - 12/24/20 1037       Assessment   Medical Diagnosis Chronic left shoulder pain    Referring Provider (OT) Sanjuana Kava, MD      Precautions   Precautions None      Prior Function   Level of Independence Independent      Observation/Other Assessments   Focus on Therapeutic Outcomes (FOTO)  57/100      ROM / Strength   AROM / PROM / Strength PROM;AROM;Strength      AROM   Overall AROM Comments assessed standing. IR/er adducted    AROM Assessment Site Shoulder    Right/Left Shoulder Left    Left Shoulder Flexion 145 Degrees   previous: 114   Left Shoulder ABduction 111 Degrees   previous: 80   Left Shoulder Internal Rotation 90 Degrees   previous: same   Left Shoulder External Rotation 55 Degrees   previous: 49     PROM   Overall PROM Comments Assessed  supine. IR/er adducted.    PROM Assessment Site Shoulder    Right/Left Shoulder Left    Left Shoulder Flexion 151 Degrees   previous: 120   Left Shoulder ABduction 180 Degrees   previous: 180   Left Shoulder Internal Rotation 90 Degrees   previous: same   Left Shoulder External Rotation 55 Degrees   previous: 20     Strength   Overall Strength Comments Assessed standing. IR/er adducted. Strength not assessed at evaluation due to pain and decreased ROM.    Strength Assessment Site Shoulder    Right/Left Shoulder Left    Left Shoulder Flexion 5/5    Left Shoulder ABduction 4/5    Left Shoulder Internal Rotation 5/5    Left Shoulder External Rotation 5/5                      OT Treatments/Exercises (OP) - 12/24/20 1114       Exercises   Exercises Shoulder      Shoulder Exercises: Standing   Extension Theraband;10 reps    Theraband Level (Shoulder Extension) Level 2 (Red)    Row Theraband;10 reps    Theraband Level (Shoulder Row) Level 2 (Red)    Retraction Theraband;10 reps    Theraband Level (Shoulder Retraction) Level 2 (Red)      Manual Therapy   Manual Therapy Myofascial release    Manual therapy comments completed separately from therapeutic exercises    Myofascial Release myofascial release to left upper arm, anterior shoulder, trapezius, and scapular regions to decrease pain and fascial restrictions and increase joint ROM                    OT Education - 12/24/20 1115     Education Details provided scapular strengthening HEP with green and red band.    Person(s) Educated Patient    Methods Explanation;Demonstration;Handout;Verbal cues    Comprehension Verbalized understanding;Returned demonstration              OT Short Term Goals - 12/24/20 1134       OT SHORT TERM GOAL #1   Title Patient will be educated and independent with HEP in order to faciliate progress in therapy and increase the use of his LUE as his nondominant extremity  for 25% or more of daily tasks.    Time 3    Period Weeks    Status Achieved    Target Date 11/23/20      OT  SHORT TERM GOAL #2   Title Patient will increase his LUE P/ROM to [facility] in order to increase ability to donn/doff shirts easier when dressing.    Time 3    Period Weeks    Status Achieved      OT SHORT TERM GOAL #3   Title Patient will decrease his LUE fascial restrictions to mod amount in order to increase functional mobility in the left shoulder that will increase ability to complete upper body dressing.    Time 3    Period Weeks    Status Achieved               OT Long Term Goals - 12/24/20 1055       OT LONG TERM GOAL #1   Title Patient will increase his LUE A/ROM to [facility] in order to complete functional reaching tasks at or above shoulder level with less difficulty.    Time 6    Period Weeks    Status Achieved      OT LONG TERM GOAL #2   Title Patient will increase his LUE strength to 4/5 or better in order to return to desired leisure tasks such as fishing if possible.    Time 6    Period Weeks    Status Achieved      OT LONG TERM GOAL #3   Title Patient will report a decrease in pain in the LUE of approximately 3/10 or less when completing functional ADL tasks.    Time 6    Period Weeks    Status Achieved      OT LONG TERM GOAL #4   Title Patient will decrease his LUE fascial restrictions to min amount or less in order to increase his functional mobility which is required for reaching tasks.    Time 6    Period Weeks    Status Achieved                   Plan - 12/24/20 1130     Clinical Impression Statement A: Reassessment completed this date. patient has met all therapy goals. He has been absent from therapy due to medical issues although he reports he has been completing his HEP at home consistantly. A/ROM is [facility] and he is able to reach above the shoulder with less difficulty. Abduction strength and ROM is still limited although improved  since initial evaluation. Pt reports that he is able to carry groceries and cross his arms over his chest which he could not do prior to therapy. HEP was updated and patient completed scapular strengthening with red band.    Body Structure / Function / Physical Skills ADL;UE functional use;Fascial restriction;Pain;ROM;Strength;Mobility;IADL    Plan P: D/C from OT services with HEP.    OT Home Exercise Plan eval: table slides, seated A/ROM scapular row. 5/24: shoulder A/ROM 6/10: scapular strengthening with red and green band.    Consulted and Agree with Plan of Care Patient             Patient will benefit from skilled therapeutic intervention in order to improve the following deficits and impairments:   Body Structure / Function / Physical Skills: ADL, UE functional use, Fascial restriction, Pain, ROM, Strength, Mobility, IADL       Visit Diagnosis: Stiffness of left shoulder, not elsewhere classified  Other symptoms and signs involving the musculoskeletal system  Chronic left shoulder pain    Problem List Patient Active Problem List   Diagnosis Date Noted  Paroxysmal atrial fibrillation (HCC)    Lateral epicondylitis, left elbow 03/27/2019   Globus sensation 02/21/2018   Other cervical disc degeneration, unspecified cervical region 01/11/2018   Tendinitis of right triceps 01/11/2018   Neck pain 12/27/2017   Lateral epicondylitis, right elbow 05/10/2017   Constipation 12/24/2015   Dyspnea 11/17/2015   Coronary artery disease due to lipid rich plaque    Heart palpitations 08/12/2015   TIA (transient ischemic attack) 08/12/2015   Colon cancer screening 08/02/2015   Abdominal pain 12/18/2014   Encounter for screening colonoscopy 12/18/2014   Unspecified vitamin D deficiency 08/20/2012   Arteriosclerotic cardiovascular disease (ASCVD) 04/11/2012   Chronic low back pain    Essential hypertension    Anxiety and depression    Controlled diabetes mellitus type 2 with  complications ([place]) 68/87/3730   Cigarette smoker 09/14/2010   CHRONIC OBSTRUCTIVE PULMONARY DISEASE 09/14/2010   HLD (hyperlipidemia) 11/25/2009   GERD (gastroesophageal reflux disease) 04/05/2009   Hepatic steatosis 04/05/2009    Ailene Ravel, OTR/L,CBIS  [id number]   12/24/2020, 11:49 AM  [facility] [address] [place], [place], [address] Phone: [phone]   Fax:  [phone]  Name: Richard [name] MRN: [id number] Date of Birth: 1965/10/11

## 2020-12-28 ENCOUNTER — Ambulatory Visit: Payer: Self-pay | Admitting: Orthopaedic Surgery

## 2020-12-31 NOTE — Telephone Encounter (Signed)
Routing to [place] for review.

## 2021-01-04 ENCOUNTER — Other Ambulatory Visit: Payer: Self-pay

## 2021-01-04 ENCOUNTER — Encounter: Payer: Self-pay | Admitting: Orthopaedic Surgery

## 2021-01-04 ENCOUNTER — Ambulatory Visit (INDEPENDENT_AMBULATORY_CARE_PROVIDER_SITE_OTHER): Payer: Self-pay | Admitting: Orthopaedic Surgery

## 2021-01-04 VITALS — BP 143/82 | HR 57 | Ht 71.75 in | Wt 191.2 lb

## 2021-01-04 DIAGNOSIS — Z9229 Personal history of other drug therapy: Secondary | ICD-10-CM

## 2021-01-04 DIAGNOSIS — M25512 Pain in left shoulder: Secondary | ICD-10-CM

## 2021-01-04 DIAGNOSIS — F17210 Nicotine dependence, cigarettes, uncomplicated: Secondary | ICD-10-CM

## 2021-01-04 DIAGNOSIS — G8929 Other chronic pain: Secondary | ICD-10-CM

## 2021-01-04 NOTE — Progress Notes (Signed)
I am much better.  He has gone to OT and is doing well. He has been discharged from OT.  I have reviewed the notes.  He has full ROM of the left shoulder now and no pain.  NV intact.  Encounter Diagnoses  Name Primary?   Chronic left shoulder pain Yes   Nicotine dependence, cigarettes, uncomplicated    HX: anticoagulation    Discharge.  Call if any problem.  Precautions discussed.  Electronically Signed Sanjuana Kava, MD 6/21/202210:26 AM

## 2021-01-12 ENCOUNTER — Other Ambulatory Visit: Payer: Self-pay | Admitting: Physician Assistant

## 2021-01-12 DIAGNOSIS — I2510 Atherosclerotic heart disease of native coronary artery without angina pectoris: Secondary | ICD-10-CM

## 2021-01-12 DIAGNOSIS — I10 Essential (primary) hypertension: Secondary | ICD-10-CM

## 2021-01-12 DIAGNOSIS — E118 Type 2 diabetes mellitus with unspecified complications: Secondary | ICD-10-CM

## 2021-01-12 DIAGNOSIS — E785 Hyperlipidemia, unspecified: Secondary | ICD-10-CM

## 2021-01-13 ENCOUNTER — Encounter: Payer: Self-pay | Admitting: *Deleted

## 2021-01-13 NOTE — Telephone Encounter (Signed)
[facility] clinical pool, please arrange EGD/dilation by Dr. Abbey Chatters, ASA should be on encounter sheet. Reason: globus sensation. Hold Xarelto 1 day prior. No oral diabetes medication day of procedure.

## 2021-01-13 NOTE — Telephone Encounter (Signed)
Called pt. He has been scheduled for 7/29 at 7:30am. Aware will mail instructions with pre-op appt.

## 2021-01-19 ENCOUNTER — Other Ambulatory Visit: Payer: Self-pay | Admitting: Physician Assistant

## 2021-01-19 ENCOUNTER — Other Ambulatory Visit: Payer: Self-pay | Admitting: Family Medicine

## 2021-01-19 ENCOUNTER — Other Ambulatory Visit: Payer: Self-pay | Admitting: Internal Medicine

## 2021-01-19 NOTE — Telephone Encounter (Signed)
Prescription refill request for Xarelto received.  Indication: PAF/TIA Last office visit: 12/02/20  Myles Gip MD Weight: 86.2kg Age: 55 Scr: 1.01 on 10/29/20 CrCl: 99.57  Based on above findings Xarelto 20mg  once daily is the appropriate dose.  Refill approved.

## 2021-01-21 ENCOUNTER — Other Ambulatory Visit (HOSPITAL_COMMUNITY)
Admission: RE | Admit: 2021-01-21 | Discharge: 2021-01-21 | Disposition: A | Discharge status: Home / Self Care | Payer: Self-pay | Source: Ambulatory Visit | Attending: Physician Assistant | Admitting: Physician Assistant

## 2021-01-21 ENCOUNTER — Other Ambulatory Visit: Payer: Self-pay

## 2021-01-21 DIAGNOSIS — E785 Hyperlipidemia, unspecified: Secondary | ICD-10-CM | POA: Insufficient documentation

## 2021-01-21 DIAGNOSIS — E118 Type 2 diabetes mellitus with unspecified complications: Secondary | ICD-10-CM | POA: Insufficient documentation

## 2021-01-21 DIAGNOSIS — I10 Essential (primary) hypertension: Secondary | ICD-10-CM | POA: Insufficient documentation

## 2021-01-21 DIAGNOSIS — I2510 Atherosclerotic heart disease of native coronary artery without angina pectoris: Secondary | ICD-10-CM | POA: Insufficient documentation

## 2021-01-21 LAB — LIPID PANEL
Cholesterol: 114 mg/dL (ref 0–200)
HDL: 30 mg/dL — ABNORMAL LOW (ref >40)
LDL Cholesterol: 61 mg/dL (ref 0–99)
Total CHOL/HDL Ratio: 3.8 RATIO
Triglycerides: 114 mg/dL (ref <150)
VLDL: 23 mg/dL (ref 0–40)

## 2021-01-21 LAB — HEMOGLOBIN A1C
Hgb A1c MFr Bld: 6.2 % — ABNORMAL HIGH (ref 4.8–5.6)
Mean Plasma Glucose: 131.24 mg/dL

## 2021-01-21 LAB — COMPREHENSIVE METABOLIC PANEL
ALT: 23 U/L (ref 0–44)
AST: 18 U/L (ref 15–41)
Albumin: 3.9 g/dL (ref 3.5–5.0)
Alkaline Phosphatase: 43 U/L (ref 38–126)
Anion gap: 6 (ref 5–15)
BUN: 13 mg/dL (ref 6–20)
CO2: 28 mmol/L (ref 22–32)
Calcium: 8.5 mg/dL — ABNORMAL LOW (ref 8.9–10.3)
Chloride: 103 mmol/L (ref 98–111)
Creatinine, Ser: 0.86 mg/dL (ref 0.61–1.24)
GFR, Estimated: >60 mL/min (ref >60)
Glucose, Bld: 139 mg/dL — ABNORMAL HIGH (ref 70–99)
Potassium: 4 mmol/L (ref 3.5–5.1)
Sodium: 137 mmol/L (ref 135–145)
Total Bilirubin: 0.4 mg/dL (ref 0.3–1.2)
Total Protein: 6.9 g/dL (ref 6.5–8.1)

## 2021-01-22 ENCOUNTER — Ambulatory Visit
Admission: RE | Admit: 2021-01-22 | Discharge: 2021-01-22 | Disposition: A | Discharge status: Home / Self Care | Payer: Self-pay | Source: Ambulatory Visit | Attending: Family Medicine | Admitting: Family Medicine

## 2021-01-22 VITALS — BP 135/82 | HR 61 | Temp 98.0°F | Resp 16

## 2021-01-22 DIAGNOSIS — K029 Dental caries, unspecified: Secondary | ICD-10-CM

## 2021-01-22 DIAGNOSIS — K047 Periapical abscess without sinus: Secondary | ICD-10-CM

## 2021-01-22 DIAGNOSIS — R220 Localized swelling, mass and lump, head: Secondary | ICD-10-CM

## 2021-01-22 LAB — MICROALBUMIN, URINE: Microalb, Ur: 12.7 ug/mL — ABNORMAL HIGH

## 2021-01-22 MED ORDER — SULFAMETHOXAZOLE-TRIMETHOPRIM 800-160 MG PO TABS: 1.0000 | ORAL_TABLET | Freq: Two times a day (BID) | ORAL | 0 refills | Status: DC

## 2021-01-22 MED ORDER — LIDOCAINE VISCOUS HCL 2 % MT SOLN: 15.0000 mL | OROMUCOSAL | 0 refills | Status: DC | PRN

## 2021-01-22 MED ORDER — PREDNISONE 20 MG PO TABS: 20.0000 mg | ORAL_TABLET | Freq: Every day | ORAL | 0 refills | Status: AC

## 2021-01-22 NOTE — ED Provider Notes (Signed)
[facility]    CSN: [id number] Arrival date & time: 01/22/21  1145      History   Chief Complaint Chief Complaint  Patient presents with   Dental Pain   Appointment    1200     HPI Richard [name] is a 55 y.o. male.   HPI Patient presents today for evaluation of right facial swelling, pain related to multiple dental caries.  Patient reports pain increased in severity during the night and dropped.  Patient has severe gingival atrophy and multiple discolored, broken teeth with visible dental caries.  Patient reports she is on a waiting list to have a dental procedure done however it may be greater than a year before he is able to see a provider.  He is afebrile.  He is currently on Xarelto therefore was unable to tolerate any NSAIDs.  Past Medical History:  Diagnosis Date   Arthritis    Asthma    CAD (coronary artery disease)    a. Cardiac cath 07/2015 showed 65% distal Cx, 20% mid-distal LAD, 20% prox-distal RCA, EF 60%, EDP 74mmHg.   Colitis 1990   COPD (chronic obstructive pulmonary disease) ([place])    Depression    Essential hypertension    Gastric ulcer 2003; 2012   2003: + esophagitis; negative H.pylori serology  2012: Dr. Oneida Alar, mild gastritis, Bravo PH probe placement, negative H.pylori   GERD (gastroesophageal reflux disease)    Hepatic steatosis    History of hiatal hernia    Hyperlipemia    Overweight    Panic attacks    Paroxysmal atrial fibrillation ([place])    Stroke ([place])    TIA (transient ischemic attack)    Type II diabetes mellitus [facility])     Patient Active Problem List   Diagnosis Date Noted   Paroxysmal atrial fibrillation (HCC)    Lateral epicondylitis, left elbow 03/27/2019   Globus sensation 02/21/2018   Other cervical disc degeneration, unspecified cervical region 01/11/2018   Tendinitis of right triceps 01/11/2018   Neck pain 12/27/2017   Lateral epicondylitis, right elbow 05/10/2017   Constipation 12/24/2015   Dyspnea 11/17/2015    Coronary artery disease due to lipid rich plaque    Heart palpitations 08/12/2015   TIA (transient ischemic attack) 08/12/2015   Colon cancer screening 08/02/2015   Abdominal pain 12/18/2014   Encounter for screening colonoscopy 12/18/2014   Unspecified vitamin D deficiency 08/20/2012   Arteriosclerotic cardiovascular disease (ASCVD) 04/11/2012   Chronic low back pain    Essential hypertension    Anxiety and depression    Controlled diabetes mellitus type 2 with complications ([place]) 54/62/7035   Cigarette smoker 09/14/2010   CHRONIC OBSTRUCTIVE PULMONARY DISEASE 09/14/2010   HLD (hyperlipidemia) 11/25/2009   GERD (gastroesophageal reflux disease) 04/05/2009   Hepatic steatosis 04/05/2009    Past Surgical History:  Procedure Laterality Date   BRAVO [place] STUDY  05/03/2011   KKX:FGHW gastritis/normal esophagus and duodenum   CARDIAC CATHETERIZATION  1990s X 1; 2005; 08/12/2015   CARDIAC CATHETERIZATION N/A 08/12/2015   Procedure: Left Heart Cath and Coronary Angiography;  Surgeon: Belva Crome, MD; LAD 20%, CFX 65%, RCA 20%, EF 60%    COLONOSCOPY  1990   COLONOSCOPY WITH PROPOFOL N/A 11/21/2016   Dr. Oneida Alar: non-thrombosed external hemorrhoids, one 6 mm polyp (polypoid lesion), internal hemorrhoids. TI Normal. 10 years screening   ESOPHAGOGASTRODUODENOSCOPY  05/03/2011   EXH:BZJI gastritis   NECK MASS EXCISION Right    "done in dr's office; behind right ear/side  of ncek"   POLYPECTOMY  11/21/2016   Procedure: POLYPECTOMY;  Surgeon: [name], MD;  Location: [facility];  Service: Endoscopy;;  descending colon polyp   SHOULDER ARTHROSCOPY W/ ROTATOR CUFF REPAIR Right 2006   acromioclavicular joint arthrosis       Home Medications    Prior to Admission medications   Medication Sig Start Date End Date Taking? Authorizing Provider  lidocaine (XYLOCAINE) 2 % solution Use as directed 15 mLs in the mouth or throat every 3 (three) hours as needed for mouth pain (gargle swish and  spit  as needed for dental pain). 01/22/21  Yes Scot Jun, FNP  predniSONE (DELTASONE) 20 MG tablet Take 1 tablet (20 mg total) by mouth daily with breakfast for 5 days. 01/22/21 01/27/21 Yes Scot Jun, FNP  sulfamethoxazole-trimethoprim (BACTRIM DS) 800-160 MG tablet Take 1 tablet by mouth 2 (two) times daily. 01/22/21  Yes Scot Jun, FNP  ALPRAZolam Duanne Moron) 0.5 MG tablet Take one tablet by mouth three times a day and two tablets at night 11/24/20   Cloria Spring, MD  aluminum-magnesium hydroxide 200-200 MG/5ML suspension Take by mouth every 6 (six) hours as needed for indigestion.    [provider]  amLODipine (NORVASC) 10 MG tablet Take 10 mg by mouth daily.    [provider]  budesonide-formoterol (SYMBICORT) 80-4.5 MCG/ACT inhaler Inhale 2 puffs into the lungs 2 (two) times daily as needed.    [provider]  cyclobenzaprine (FLEXERIL) 10 MG tablet Take 1 tablet (10 mg total) by mouth as needed for muscle spasms. 10/01/20   Murlean Iba, MD  esomeprazole (NEXIUM) 40 MG capsule Take 1 capsule (40 mg total) by mouth 2 (two) times daily before a meal. 06/03/20   Annitta Needs, NP  JANUVIA 100 MG tablet TAKE 1 Tablet BY MOUTH ONCE DAILY Patient taking differently: Take 100 mg by mouth daily. 07/27/20   [name], PA-C  metoprolol succinate (TOPROL-XL) 50 MG 24 hr tablet Take 1 tablet (50 mg total) by mouth daily. 10/01/20   Johnson, Clanford L, MD  nitroGLYCERIN (NITROSTAT) 0.4 MG SL tablet Place 1 tablet (0.4 mg total) under the tongue every 5 (five) minutes x 3 doses as needed for chest pain (if no relief after 3rd dose, proceed to the ED for an evaluation). 02/02/20   Allred, Jeneen Rinks, MD  PROVENTIL HFA 108 [phone] Base) MCG/ACT inhaler INHALE 2 PUFFS BY MOUTH EVERY 6 HOURS AS NEEDED FOR COUGHING, WHEEZING, OR SHORTNESS OF BREATH 07/27/20   [name], PA-C  simvastatin (ZOCOR) 20 MG tablet TAKE 1 TABLET BY MOUTH AT BEDTIME Patient taking  differently: Take 20 mg by mouth at bedtime. 08/16/20   [name], PA-C  XARELTO 20 MG TABS tablet TAKE 1 Tablet BY MOUTH ONCE EVERY DAY WITH SUPPER 01/19/21   Satira Sark, MD    Family History Family History  Problem Relation Age of Onset   Lung cancer Mother    Alcohol abuse Mother    Heart attack Father 61   Diabetes Father    Alcohol abuse Father    Hypertension Brother    Hypertension Brother    Anxiety disorder Sister    Depression Sister    Anxiety disorder Sister    Heart attack Brother 15   Diabetes Brother    Hypertension Brother    Seizures Brother    Dementia Paternal Uncle    Dementia Cousin    ADD / ADHD Daughter  Colon cancer Neg Hx    Drug abuse Neg Hx    Bipolar disorder Neg Hx    OCD Neg Hx    Paranoid behavior Neg Hx    Schizophrenia Neg Hx    Sexual abuse Neg Hx    Physical abuse Neg Hx     Social History Social History   Tobacco Use   Smoking status: Every Day    Packs/day: 0.25    Years: 25.00    Pack years: 6.25    Types: Cigarettes    Start date: 07/17/1982   Smokeless tobacco: Never  Vaping Use   Vaping Use: Never used  Substance Use Topics   Alcohol use: No    Alcohol/week: 0.0 standard drinks   Drug use: No     Allergies   Dexilant [dexlansoprazole], Mushroom ext cmplx(shiitake-reishi-mait), Penicillins, and Doxycycline   Review of Systems Review of Systems Pertinent negatives listed in HPI   Physical Exam Triage Vital Signs ED Triage Vitals  Enc Vitals Group     BP 01/22/21 1205 135/82     Pulse Rate 01/22/21 1205 61     Resp 01/22/21 1205 16     Temp 01/22/21 1205 98 F (36.7 C)     Temp Source 01/22/21 1205 Oral     SpO2 01/22/21 1205 97 %     Weight --      Height --      Head Circumference --      Peak Flow --      Pain Score 01/22/21 1202 3     Pain Loc --      Pain Edu? --      Excl. in [place]? --    No data found.  Updated Vital Signs BP 135/82 (BP Location: Right Arm)   Pulse 61   Temp  98 F (36.7 C) (Oral)   Resp 16   SpO2 97%   Visual Acuity Right Eye Distance:   Left Eye Distance:   Bilateral Distance:    Right Eye Near:   Left Eye Near:    Bilateral Near:     Physical Exam HENT:     Mouth/Throat:     Dentition: Abnormal dentition. Dental tenderness, gingival swelling and dental caries present.  Cardiovascular:     Rate and Rhythm: Normal rate and regular rhythm.  Pulmonary:     Effort: Pulmonary effort is normal.     Breath sounds: Normal breath sounds.  Neurological:     Mental Status: He is alert.  Psychiatric:        Attention and Perception: Attention normal.        Mood and Affect: Mood normal.        Speech: Speech normal.     UC Treatments / Results  Labs (all labs ordered are listed, but only abnormal results are displayed) Labs Reviewed - No data to display  EKG   Radiology No results found.  Procedures Procedures (including critical care time)  Medications Ordered in UC Medications - No data to display  Initial Impression / Assessment and Plan / UC Course  I have reviewed the triage vital signs and the nursing notes.  Pertinent labs & imaging results that were available during my care of the patient were reviewed by me and considered in my medical decision making (see chart for details).    Infected dental caries along with right-sided facial swelling.  Treatment with Bactrim twice daily for 10 days.  Lidocaine viscous as needed for pain.  Prednisone 20 mg once daily for facial swelling.  Advised to continue efforts to follow-up with a dental provider.  Advised patient on uncertain given the degree of his dental problems if antibiotic will remedy current pain he is experiencing.  Patient verbalized understanding. Final Clinical Impressions(s) / UC Diagnoses   Final diagnoses:  Infected dental caries  Right facial swelling   Discharge Instructions   None    ED Prescriptions     Medication Sig Dispense Auth. Provider    sulfamethoxazole-trimethoprim (BACTRIM DS) 800-160 MG tablet Take 1 tablet by mouth 2 (two) times daily. 20 tablet Scot Jun, FNP   lidocaine (XYLOCAINE) 2 % solution Use as directed 15 mLs in the mouth or throat every 3 (three) hours as needed for mouth pain (gargle swish and spit  as needed for dental pain). 100 mL Scot Jun, FNP   predniSONE (DELTASONE) 20 MG tablet Take 1 tablet (20 mg total) by mouth daily with breakfast for 5 days. 5 tablet Scot Jun, FNP      PDMP not reviewed this encounter.   Scot Jun, FNP 01/22/21 1250

## 2021-01-22 NOTE — ED Triage Notes (Signed)
Patient presents to Urgent Care with complaints of right sided dental pain. He states this has been an on-going issue and has been waiting to be seen at the dental clinic for the past year. He reports pain has increased overtime. Treating pain with tylenol.   Denies fever.

## 2021-01-24 ENCOUNTER — Ambulatory Visit: Payer: Self-pay | Admitting: Physician Assistant

## 2021-01-24 ENCOUNTER — Other Ambulatory Visit: Payer: Self-pay

## 2021-01-24 ENCOUNTER — Encounter: Payer: Self-pay | Admitting: Physician Assistant

## 2021-01-24 VITALS — BP 125/69 | HR 61 | Temp 97.6°F | Wt 192.0 lb

## 2021-01-24 DIAGNOSIS — E785 Hyperlipidemia, unspecified: Secondary | ICD-10-CM

## 2021-01-24 DIAGNOSIS — I2510 Atherosclerotic heart disease of native coronary artery without angina pectoris: Secondary | ICD-10-CM

## 2021-01-24 DIAGNOSIS — E118 Type 2 diabetes mellitus with unspecified complications: Secondary | ICD-10-CM

## 2021-01-24 DIAGNOSIS — K219 Gastro-esophageal reflux disease without esophagitis: Secondary | ICD-10-CM

## 2021-01-24 DIAGNOSIS — K029 Dental caries, unspecified: Secondary | ICD-10-CM

## 2021-01-24 DIAGNOSIS — F17200 Nicotine dependence, unspecified, uncomplicated: Secondary | ICD-10-CM

## 2021-01-24 DIAGNOSIS — I10 Essential (primary) hypertension: Secondary | ICD-10-CM

## 2021-01-24 DIAGNOSIS — I480 Paroxysmal atrial fibrillation: Secondary | ICD-10-CM

## 2021-01-24 NOTE — Progress Notes (Signed)
BP 125/69   Pulse 61   Temp 97.6 F (36.4 C)   Wt 192 lb (87.1 kg)   SpO2 98%   BMI 26.22 kg/m    Subjective:    Patient ID: Richard [name], male    DOB: 1965-11-14, 55 y.o.   MRN: [id number]  HPI: Richard [name] is a 55 y.o. male presenting on 01/24/2021 for No chief complaint on file.   HPI   Pt had a negative covid 19 screening questionnaire.   Pt is 27yoM who presents for routine follow up.  He says he is doing well.  He has Not gotten the covid vaccination.  He still smokes, he says mostly at night so he can go to sleep.   He went to urgent care on 2 days ago to get antibiotics for a tooth; he was given septra and prednisone for his tooth.  Pt continues to see GI for gerd  Pt continues to see cardiology for CAD and PAF.  Pt continues to see [place] specialist for panic disorder.   Pt was seeing orthopedics for his shoulder is finished with that office for now.        Relevant past medical, surgical, family and social history reviewed and updated as indicated. Interim medical history since our last visit reviewed. Allergies and medications reviewed and updated.    Current Outpatient Medications:    ALPRAZolam (XANAX) 0.5 MG tablet, Take one tablet by mouth three times a day and two tablets at night, Disp: 150 tablet, Rfl: 2   aluminum-magnesium hydroxide 200-200 MG/5ML suspension, Take by mouth every 6 (six) hours as needed for indigestion., Disp: , Rfl:    amLODipine (NORVASC) 10 MG tablet, Take 10 mg by mouth daily., Disp: , Rfl:    budesonide-formoterol (SYMBICORT) 80-4.5 MCG/ACT inhaler, Inhale 2 puffs into the lungs 2 (two) times daily as needed., Disp: , Rfl:    cyclobenzaprine (FLEXERIL) 10 MG tablet, Take 1 tablet (10 mg total) by mouth as needed for muscle spasms., Disp: , Rfl:    esomeprazole (NEXIUM) 40 MG capsule, Take 1 capsule (40 mg total) by mouth 2 (two) times daily before a meal., Disp: 60 capsule, Rfl: 5   JANUVIA 100 MG tablet, TAKE 1 Tablet BY  MOUTH ONCE DAILY (Patient taking differently: Take 100 mg by mouth daily.), Disp: 90 tablet, Rfl: 1   lidocaine (XYLOCAINE) 2 % solution, Use as directed 15 mLs in the mouth or throat every 3 (three) hours as needed for mouth pain (gargle swish and spit  as needed for dental pain)., Disp: 100 mL, Rfl: 0   metoprolol succinate (TOPROL-XL) 50 MG 24 hr tablet, Take 1 tablet (50 mg total) by mouth daily., Disp: 135 tablet, Rfl: 3   nitroGLYCERIN (NITROSTAT) 0.4 MG SL tablet, Place 1 tablet (0.4 mg total) under the tongue every 5 (five) minutes x 3 doses as needed for chest pain (if no relief after 3rd dose, proceed to the ED for an evaluation)., Disp: 25 tablet, Rfl: 3   predniSONE (DELTASONE) 20 MG tablet, Take 1 tablet (20 mg total) by mouth daily with breakfast for 5 days., Disp: 5 tablet, Rfl: 0   PROVENTIL HFA 108 (90 Base) MCG/ACT inhaler, INHALE 2 PUFFS BY MOUTH EVERY 6 HOURS AS NEEDED FOR COUGHING, WHEEZING, OR SHORTNESS OF BREATH, Disp: 13.4 g, Rfl: 1   simvastatin (ZOCOR) 20 MG tablet, TAKE 1 TABLET BY MOUTH AT BEDTIME (Patient taking differently: Take 20 mg by mouth at bedtime.), Disp: 30  tablet, Rfl: 6   sulfamethoxazole-trimethoprim (BACTRIM DS) 800-160 MG tablet, Take 1 tablet by mouth 2 (two) times daily., Disp: 20 tablet, Rfl: 0   XARELTO 20 MG TABS tablet, TAKE 1 Tablet BY MOUTH ONCE EVERY DAY WITH SUPPER, Disp: 90 tablet, Rfl: 1   Review of Systems  Per HPI unless specifically indicated above     Objective:    BP 125/69   Pulse 61   Temp 97.6 F (36.4 C)   Wt 192 lb (87.1 kg)   SpO2 98%   BMI 26.22 kg/m   Wt Readings from Last 3 Encounters:  01/24/21 192 lb (87.1 kg)  01/04/21 191 lb 3.2 oz (86.7 kg)  12/21/20 192 lb 12.8 oz (87.5 kg)    Physical Exam Vitals reviewed.  Constitutional:      General: He is not in acute distress.    Appearance: He is well-developed. He is not ill-appearing.  HENT:     Head: Normocephalic and atraumatic.  Cardiovascular:     Rate and  Rhythm: Normal rate and regular rhythm.  Pulmonary:     Effort: Pulmonary effort is normal.     Breath sounds: Normal breath sounds. No wheezing.  Abdominal:     General: Bowel sounds are normal.     Palpations: Abdomen is soft.     Tenderness: There is no abdominal tenderness.  Musculoskeletal:     Cervical back: Neck supple.     Right lower leg: No edema.     Left lower leg: No edema.  Lymphadenopathy:     Cervical: No cervical adenopathy.  Skin:    General: Skin is warm and dry.  Neurological:     Mental Status: He is alert and oriented to person, place, and time.  Psychiatric:        Behavior: Behavior normal.       Results for orders placed or performed during the hospital encounter of 01/21/21  Lipid panel  Result Value Ref Range   Cholesterol 114 0 - 200 mg/dL   Triglycerides 114 <150 mg/dL   HDL 30 (L) >40 mg/dL   Total CHOL/HDL Ratio 3.8 RATIO   VLDL 23 0 - 40 mg/dL   LDL Cholesterol 61 0 - 99 mg/dL  Comprehensive metabolic panel  Result Value Ref Range   Sodium 137 135 - 145 mmol/L   Potassium 4.0 3.5 - 5.1 mmol/L   Chloride 103 98 - 111 mmol/L   CO2 28 22 - 32 mmol/L   Glucose, Bld 139 (H) 70 - 99 mg/dL   BUN 13 6 - 20 mg/dL   Creatinine, Ser 0.86 0.61 - 1.24 mg/dL   Calcium 8.5 (L) 8.9 - 10.3 mg/dL   Total Protein 6.9 6.5 - 8.1 g/dL   Albumin 3.9 3.5 - 5.0 g/dL   AST 18 15 - 41 U/L   ALT 23 0 - 44 U/L   Alkaline Phosphatase 43 38 - 126 U/L   Total Bilirubin 0.4 0.3 - 1.2 mg/dL   GFR, Estimated >60 >60 mL/min   Anion gap 6 5 - 15  Hemoglobin A1c  Result Value Ref Range   Hgb A1c MFr Bld 6.2 (H) 4.8 - 5.6 %   Mean Plasma Glucose 131.24 mg/dL  Microalbumin, urine  Result Value Ref Range   Microalb, Ur 12.7 (H) Not Estab. ug/mL      Assessment & Plan:     Encounter Diagnoses  Name Primary?   Controlled diabetes mellitus type 2 with complications, unspecified whether long  term insulin use ([place]) Yes   Essential hypertension    Hyperlipidemia,  unspecified hyperlipidemia type    Coronary artery disease involving native heart without angina pectoris, unspecified vessel or lesion type    Gastroesophageal reflux disease, unspecified whether esophagitis present    Paroxysmal atrial fibrillation ([place])    Tobacco use disorder    Dental decay       -reviewed labs with pt -no changes to medications -pt is on Dental list -recommended to pt to get covid vaccination -pt to follow up 3 months.  He is to contact office sooner prn

## 2021-01-31 ENCOUNTER — Other Ambulatory Visit: Payer: Self-pay | Admitting: Physician Assistant

## 2021-01-31 ENCOUNTER — Telehealth: Payer: Self-pay | Admitting: Physician Assistant

## 2021-01-31 MED ORDER — CLINDAMYCIN HCL 300 MG PO CAPS: 300.0000 mg | ORAL_CAPSULE | Freq: Three times a day (TID) | ORAL | 0 refills | Status: AC

## 2021-01-31 NOTE — Telephone Encounter (Signed)
Pt called and spoke with Alva Garnet CMA and reported reactions to prednisone and septra and requested alt abx.  Rx clindamycin was sent to his pharmacy (KT doesn't have epic access today so unable to document herself)

## 2021-02-07 ENCOUNTER — Telehealth: Payer: Self-pay | Admitting: Internal Medicine

## 2021-02-07 ENCOUNTER — Ambulatory Visit: Payer: Self-pay | Admitting: Cardiology

## 2021-02-07 NOTE — Telephone Encounter (Signed)
Please call patient to reschedule his procedure, has a tooth infection and is currently on antibiotics.

## 2021-02-07 NOTE — Telephone Encounter (Signed)
Spoke to wife, he has bad infection in top teeth and is going to have extractions starting 03/14/21. Infection is up into sinuses. He's in a lot of pain. Requested to cancel procedure for 02/11/21 and will call back later to reschedule.   Endo scheduler informed to cancel procedure.

## 2021-02-09 ENCOUNTER — Encounter (HOSPITAL_COMMUNITY): Payer: Self-pay

## 2021-02-11 ENCOUNTER — Encounter (HOSPITAL_COMMUNITY): Admission: RE | Payer: Self-pay | Source: Home / Self Care

## 2021-02-11 ENCOUNTER — Ambulatory Visit (HOSPITAL_COMMUNITY): Admission: RE | Admit: 2021-02-11 | Payer: Self-pay | Source: Home / Self Care

## 2021-02-11 SURGERY — ESOPHAGOGASTRODUODENOSCOPY (EGD) WITH PROPOFOL
Anesthesia: Monitor Anesthesia Care

## 2021-02-15 ENCOUNTER — Ambulatory Visit: Payer: Self-pay | Admitting: Neurology

## 2021-03-01 ENCOUNTER — Ambulatory Visit: Payer: Self-pay | Admitting: Family Medicine

## 2021-03-08 ENCOUNTER — Telehealth: Payer: Self-pay | Admitting: Cardiology

## 2021-03-08 NOTE — Telephone Encounter (Addendum)
   Name: MEGHAN FAATZ  DOB: 1966/02/19  MRN: [id number]   Primary Cardiologist: Rozann Lesches, MD  Chart reviewed as part of pre-operative protocol coverage. Patient was contacted 03/08/2021 in reference to pre-operative risk assessment for pending surgery as outlined below.  Smayan L Munyon was last seen on 11/2020 with pertinent CV hx to include moderate CAD 2017, stroke, TIA, PAF, HTN. Patient states he has numerous teeth to be extracted but he is not sure if it will be done in groups or all at once. The dental office is requesting local anesthesia per clearance report. No hx of valvular repairs so SBE is not indicated from cardiac standpoint.   In talking with the patient he feels like his atrial fib has been acting up the last few weeks, becoming more frequent. He states his wife has attributed this to anxiety. He has struggled with multiple somatic symptoms in the past. It can be worse in the morning. No specific VS to report. Given increase in afib symptoms, will route to [name] to find out if he needs an OV first before clearing him to have teeth out. Regardless, likely needs OV to readdress - but question is whether he can proceed with this procedure 8/30 first. (He states the extraction is going to be done because some of his teeth are felt to be impacting his sinuses and potential for infection.) [name] - Please route response to P CV DIV PREOP (the pre-op pool). Thank you.  Will also route to callback staff to clarify with dentist how many teeth will be extracted at once since patient is unsure (if 1-2, will not need to hold anticoag but if multiple, will need to involve pharmacy team).  Charlie Pitter, PA-C 03/08/2021, 3:04 PM

## 2021-03-08 NOTE — Telephone Encounter (Signed)
Await reply from dental team/callback re: # of extractions.

## 2021-03-08 NOTE — Telephone Encounter (Signed)
   [facility] Pre-operative Risk Assessment    HEARTCARE STAFF: - Please ensure there is not already an duplicate clearance open for this procedure. - Under Visit Info/Reason for Call, type in Other and utilize the format Clearance MM/DD/YY or Clearance TBD. Do not use dashes or single digits. - If request is for dental extraction, please clarify the # of teeth to be extracted.  Request for surgical clearance:  What type of surgery is being performed? Oral Surgery-Teeth Extractions  When is this surgery scheduled? 03/05/21  What type of clearance is required (medical clearance vs. Pharmacy clearance to hold med vs. Both)? Both  Are there any medications that need to be held prior to surgery and how long? Advise   Practice name and name of physician performing surgery? [facility] / Erik Obey, DDS, MHA  What is the office phone number? [phone]   7.   What is the office fax number? [phone]  8.   Anesthesia type (None, local, MAC, general) ? Local   Amy B Barnes 03/08/2021, 2:31 PM  _________________________________________________________________   (provider comments below)

## 2021-03-08 NOTE — Telephone Encounter (Addendum)
In discussing with [name], we feel in general extraction will be a low risk procedure and he can proceed without further acute cardiac testing so that we are not delaying any necessary dental health procedures, but we can certainly offer him an appointment for general follow-up for his atrial fib. Will route to pharm team for input on anticoagulation since it will be multiple teeth extracted together at one time. Once this info is known, we can bundle this recommendation onward to the dental office and route to callback to notify pt and offer follow-up visit. No hx of valvular repairs so SBE is not indicated from cardiac standpoint.

## 2021-03-08 NOTE — Telephone Encounter (Signed)
I just s/w the dental office, confirmed the pt is actually going to have multiple Tx plan. I did inform dental office that we cannot provide a blanket type clearance especially since the pt is on a blood thinner. Advised that we can clear for procedure being done on 03/15/21. Advised we will need a new clearance request to be faxed to our office when they are ready for the next Tx plan. Dental office has given verbal understanding to plan of care.  Pt's first procedure will be to have 5 teeth extracted, they are not sure if these will be surgical or simple until they get started per dental office.

## 2021-03-08 NOTE — Telephone Encounter (Signed)
I s/w the pt tonight in regards to setting up an appt for a-fib, see previous notes. Pt is aware we have asked Pharm-D for recommendations as to how long to hold his Eliquis for hs dental work. Pt and I went ahead and scheduled a f/u though for his a-fib. Pt scheduled to see Levell July, NP 03/24/21 @ 11:30. Pt said his a-fib is not happening like it used to but when it does it hits him hard. Pt aware we will let him know once cleared and how long to hold Eliquis. Pt said thank you for my help and call today.

## 2021-03-08 NOTE — Telephone Encounter (Signed)
Left message for dental office to call back and confirm how many teeth are going to be extracted, as well as if they are simple extractions or surgical extractions.

## 2021-03-09 ENCOUNTER — Telehealth: Payer: Self-pay

## 2021-03-09 NOTE — Telephone Encounter (Signed)
Patient with diagnosis of atrial fibrillation on Xarelto for anticoagulation.    Procedure: 5 dental extracions Date of procedure: TBD   CHA2DS2-VASc Score = 5  This indicates a 7.2% annual risk of stroke. The patient's score is based upon: CHF History: No HTN History: Yes Diabetes History: Yes Stroke History: Yes Vascular Disease History: Yes Age Score: 0 Gender Score: 0    CrCl 109 (using adjusted body weight) Platelet count 183  Patient does not require pre-op antibiotics for dental procedure.  Per office protocol, patient can hold Xarelto for 1 days prior to procedure.   Patient will not need bridging with Lovenox (enoxaparin) around procedure.  Due to history or TIA x 2 (2017, 09/2020) would not recommend holding > 1 day. If dental practice needs > 1 day hold will have to have reviewed by primary cardiologist.    Patient should restart Xarelto on the evening of procedure or day after, at discretion of procedure MD

## 2021-03-09 NOTE — Telephone Encounter (Signed)
Called pt, spoke with wife and informed documentation for dental treatment has been faxed.

## 2021-03-10 ENCOUNTER — Other Ambulatory Visit: Payer: Self-pay

## 2021-03-10 ENCOUNTER — Telehealth: Payer: Self-pay | Admitting: Internal Medicine

## 2021-03-10 DIAGNOSIS — R110 Nausea: Secondary | ICD-10-CM

## 2021-03-10 DIAGNOSIS — R1011 Right upper quadrant pain: Secondary | ICD-10-CM

## 2021-03-10 DIAGNOSIS — K9289 Other specified diseases of the digestive system: Secondary | ICD-10-CM

## 2021-03-10 NOTE — Telephone Encounter (Signed)
Phoned the pt back and offered labs before scan today to check for the above issues and he agreed( but he is not going until Monday to have these done when his wife goes to have her labs done) advised scan will come if the bloodwork is abnormal. Advised the pt that if his symptoms get worse or his pain gets worse, vomiting, fever to go to the ER. Pt expressed understanding. Will fax lab orders tomorrow to [facility] advising when the pt is coming.

## 2021-03-10 NOTE — Telephone Encounter (Signed)
I do not feel comfortable ordering "scans" without evaluation of some sorts first. I would offer him to go for labs today, I'll check for infection, gallbladder issues. If any are abnormal, then we may move forward with ultrasound.   Review of Anna's note, planned for EGD but patient had to cancel last month due to dental infection. He has not been rescheduled.   I would defer any "scans" until after labs. If his symptoms worsen, has significant pain, vomiting, fever, he should go to the er in interim.    Arrange for CBC, CMET, lipase.

## 2021-03-10 NOTE — Telephone Encounter (Signed)
Returned the pt's call , spoke with the pt. He stated he has had some right side pain (near rib cage and below)for the past year. But more steady the past 3 or 4 days. Pain level level 2 or 3 (scale 1-10). Some nausea x 4 days ago. Nothing helps his pain and he doesn't know what makes it worse. States he has stopped eating/drinking. Last bowel movement was this morning and yesterday but it wasn't much of anything. Pt states he feels like he is bloated. Pt is wanting a scan done before his appt.   Sending to you per office protocol. The pt hasn't seen anyone but Vicente Males but she is out until Monday.

## 2021-03-10 NOTE — Telephone Encounter (Signed)
    Patient Name: Richard [name]  DOB: [birth year] MRN: [id number]  Primary Cardiologist: Rozann Lesches, MD  Chart reviewed as part of pre-operative protocol coverage. Given past medical history and time since last visit, based on ACC/AHA guidelines, Aarin L Vieyra would be at acceptable risk for the planned procedure without further cardiovascular testing.   Case discussed with [name], we feel in general extraction will be a low risk procedure and he can proceed without further acute cardiac testing so that we are not delaying any necessary dental health procedures, however he was offered and scheduled for close follow up 03/24/21 for general atrial fibrillation follow up.    Patient with diagnosis of atrial fibrillation on Xarelto for anticoagulation.     Procedure: 5 dental extracions Date of procedure: TBD   CHA2DS2-VASc Score = 5  This indicates a 7.2% annual risk of stroke. The patient's score is based upon: CHF History: No HTN History: Yes Diabetes History: Yes Stroke History: Yes Vascular Disease History: Yes Age Score: 0 Gender Score: 0   CrCl 109 (using adjusted body weight) Platelet count 183   Patient does not require pre-op antibiotics for dental procedure.   Per office protocol, patient can hold Xarelto for 1 days prior to procedure.   Patient will not need bridging with Lovenox (enoxaparin) around procedure.   Due to history or TIA x 2 (2017, 09/2020) would not recommend holding > 1 day. If dental practice needs > 1 day hold will have to have reviewed by primary cardiologist.    Patient should restart Xarelto on the evening of procedure or day after, at discretion of procedure MD  The patient was advised that if he develops new symptoms prior to surgery to contact our office to arrange for a follow-up visit, and he verbalized understanding.  I will route this recommendation to the requesting party via Epic fax function and remove from pre-op pool.  Please  call with questions.  Kathyrn Drown, NP 03/10/2021, 7:33 AM

## 2021-03-10 NOTE — Telephone Encounter (Signed)
[phone] PATIENT WIFE CALLED AND SAID THAT THE PATIENT HAS BEEN HAVING PAIN IN HIS SIDE, NEAR HIS GALLBLADDER.  CAN HE HAVE A SCAN TO CHECK THE ABDOMEN BEFORE HIS APPOINTMENT.  HE IS NOT SCHEDULED UNTIL December

## 2021-03-14 ENCOUNTER — Other Ambulatory Visit (HOSPITAL_COMMUNITY)
Admission: RE | Admit: 2021-03-14 | Discharge: 2021-03-14 | Disposition: A | Discharge status: Home / Self Care | Payer: Self-pay | Source: Ambulatory Visit | Attending: Gastroenterology | Admitting: Gastroenterology

## 2021-03-14 ENCOUNTER — Other Ambulatory Visit: Payer: Self-pay

## 2021-03-14 DIAGNOSIS — R1011 Right upper quadrant pain: Secondary | ICD-10-CM | POA: Insufficient documentation

## 2021-03-14 DIAGNOSIS — K9289 Other specified diseases of the digestive system: Secondary | ICD-10-CM | POA: Insufficient documentation

## 2021-03-14 DIAGNOSIS — R110 Nausea: Secondary | ICD-10-CM | POA: Insufficient documentation

## 2021-03-14 LAB — CBC WITH DIFFERENTIAL/PLATELET
Abs Immature Granulocytes: 0.02 10*3/uL (ref 0.00–0.07)
Basophils Absolute: 0 10*3/uL (ref 0.0–0.1)
Basophils Relative: 0 %
Eosinophils Absolute: 0.1 10*3/uL (ref 0.0–0.5)
Eosinophils Relative: 2 %
HCT: 48.1 % (ref 39.0–52.0)
Hemoglobin: 16.8 g/dL (ref 13.0–17.0)
Immature Granulocytes: 0 %
Lymphocytes Relative: 20 %
Lymphs Abs: 1.7 10*3/uL (ref 0.7–4.0)
MCH: 32.1 pg (ref 26.0–34.0)
MCHC: 34.9 g/dL (ref 30.0–36.0)
MCV: 91.8 fL (ref 80.0–100.0)
Monocytes Absolute: 0.6 10*3/uL (ref 0.1–1.0)
Monocytes Relative: 7 %
Neutro Abs: 6.3 10*3/uL (ref 1.7–7.7)
Neutrophils Relative %: 71 %
Platelets: 191 10*3/uL (ref 150–400)
RBC: 5.24 MIL/uL (ref 4.22–5.81)
RDW: 12.3 % (ref 11.5–15.5)
WBC: 8.7 10*3/uL (ref 4.0–10.5)
nRBC: 0 % (ref 0.0–0.2)

## 2021-03-14 LAB — COMPREHENSIVE METABOLIC PANEL
ALT: 19 U/L (ref 0–44)
AST: 16 U/L (ref 15–41)
Albumin: 4.5 g/dL (ref 3.5–5.0)
Alkaline Phosphatase: 54 U/L (ref 38–126)
Anion gap: 6 (ref 5–15)
BUN: 14 mg/dL (ref 6–20)
CO2: 28 mmol/L (ref 22–32)
Calcium: 9.2 mg/dL (ref 8.9–10.3)
Chloride: 102 mmol/L (ref 98–111)
Creatinine, Ser: 1.05 mg/dL (ref 0.61–1.24)
GFR, Estimated: >60 mL/min (ref >60)
Glucose, Bld: 134 mg/dL — ABNORMAL HIGH (ref 70–99)
Potassium: 4.2 mmol/L (ref 3.5–5.1)
Sodium: 136 mmol/L (ref 135–145)
Total Bilirubin: 0.5 mg/dL (ref 0.3–1.2)
Total Protein: 7.7 g/dL (ref 6.5–8.1)

## 2021-03-14 LAB — LIPASE, BLOOD: Lipase: 21 U/L (ref 11–51)

## 2021-03-14 NOTE — Telephone Encounter (Addendum)
Reports he was instructed to contact office if a-fib symptoms worsen Wants to know if he should continue with procedure Advised that he would be contacted by pre-op pool.

## 2021-03-14 NOTE — Telephone Encounter (Signed)
Called and spoke with patient. He continues to have episodes of atrial fibrillation, normally worse in the morning and in the evening. Can last anywhere from about 20 minutes to a couple of hours? He sometimes has associated mild shortness of breath with this as well as a headache. He is not exactly sure what his heart rate is during these episodes but it does not sound like he is having significant tachycardia. It sounds like he is just very aware that that his heart is beating irregularly. No chest pain. He really wants to have his dental procedure tomorrow as he thinks this may be contributing to his atrial fibrillation. I reviewed Dr. Myles Gip recommendation and discussed how dental procedures are very low risk. Patient feels comfortably proceeding with dental procedure and I think this is OK as well. Reviewed recommendations for holding Xarelto as below.  Also recommend he keep a daily BP/HR log and bring to visit with Levell July, NP, on 03/24/2021.  Darreld Mclean, PA-C 03/14/2021 9:42 AM

## 2021-03-14 NOTE — Telephone Encounter (Signed)
Patients spouse [name] called into office and stated they have not heard anything back from out office if patient is cleared to have a dental procedure on 8/30. Patient wife stated that patient would need to hold Xarelto, which patient takes in the evening. Spouse was not sure what to do since he has been having some issues with afib and has appt on 9/8. Please contact patient and or patient spouse [phone], [phone].

## 2021-03-23 ENCOUNTER — Telehealth: Payer: Self-pay | Admitting: Cardiology

## 2021-03-23 MED ORDER — AMLODIPINE BESYLATE 10 MG PO TABS: 10.0000 mg | ORAL_TABLET | Freq: Every day | ORAL | 1 refills | Status: DC

## 2021-03-23 NOTE — Telephone Encounter (Signed)
Please give pt call - has some questions about his BP medications and what he should be taking.   [phone]

## 2021-03-23 NOTE — Progress Notes (Signed)
noted 

## 2021-03-23 NOTE — Telephone Encounter (Signed)
Need refill for amlodipine 10 mg Confirms that he has been taking 10 mg amlodipine daily Advised that refill would be sent

## 2021-03-24 ENCOUNTER — Ambulatory Visit: Payer: Self-pay | Admitting: Family Medicine

## 2021-03-25 ENCOUNTER — Ambulatory Visit (INDEPENDENT_AMBULATORY_CARE_PROVIDER_SITE_OTHER): Payer: Self-pay | Admitting: Cardiology

## 2021-03-25 ENCOUNTER — Ambulatory Visit (INDEPENDENT_AMBULATORY_CARE_PROVIDER_SITE_OTHER): Payer: Self-pay

## 2021-03-25 ENCOUNTER — Other Ambulatory Visit: Payer: Self-pay

## 2021-03-25 ENCOUNTER — Other Ambulatory Visit: Payer: Self-pay | Admitting: Cardiology

## 2021-03-25 ENCOUNTER — Encounter: Payer: Self-pay | Admitting: Cardiology

## 2021-03-25 VITALS — BP 120/76 | HR 60 | Ht 71.0 in | Wt 187.6 lb

## 2021-03-25 DIAGNOSIS — I480 Paroxysmal atrial fibrillation: Secondary | ICD-10-CM

## 2021-03-25 DIAGNOSIS — Z720 Tobacco use: Secondary | ICD-10-CM

## 2021-03-25 DIAGNOSIS — I2583 Coronary atherosclerosis due to lipid rich plaque: Secondary | ICD-10-CM

## 2021-03-25 DIAGNOSIS — I495 Sick sinus syndrome: Secondary | ICD-10-CM

## 2021-03-25 DIAGNOSIS — I2510 Atherosclerotic heart disease of native coronary artery without angina pectoris: Secondary | ICD-10-CM

## 2021-03-25 DIAGNOSIS — I4891 Unspecified atrial fibrillation: Secondary | ICD-10-CM

## 2021-03-25 DIAGNOSIS — E782 Mixed hyperlipidemia: Secondary | ICD-10-CM

## 2021-03-25 DIAGNOSIS — I10 Essential (primary) hypertension: Secondary | ICD-10-CM

## 2021-03-25 NOTE — Progress Notes (Signed)
Cardiology Office Note   Date:  03/27/2021   ID:  Richard [name], DOB 06/04/1966, MRN [id number]  PCP:  [name], PA-C  Cardiologist:  [name]    Chief Complaint  Patient presents with   Atrial Fibrillation      History of Present Illness: Richard [name] is a 55 y.o. male who presents for atrial fib,   hx of CAD with cath in 2017 , HLD, HTN PAF on xarelto for CHA2DS2VASc of 5.  Acquired thrombophilia.   Last seen in May this year by [name] for 'Knot " in mid to lower sternal area.  Nuc study was done 12/2020 and low risk study.  Echo 09/2020 with normal EF and stable valves and bubble study was negative.  Carotid arteries with mild 1-49% stenosis in Rt carotid Lt without stenosis. Cardiac cath 2017 with dLCX of 65% stenosis, in LAD and RCA 20% stenosis.   More freq episodes of PAF. Also with dizziness while sitting.  A fib may last 15 min to days.  His BB was recently decreased to 50 from 75 due to bradycardia.   14 day alive monitor  see [name]   Pt also needs EGD.    Past Medical History:  Diagnosis Date   Arthritis    Asthma    CAD (coronary artery disease)    a. Cardiac cath 07/2015 showed 65% distal Cx, 20% mid-distal LAD, 20% prox-distal RCA, EF 60%, EDP 20mHg.   Colitis 1990   COPD (chronic obstructive pulmonary disease) ([facility]    Depression    Essential hypertension    Gastric ulcer 2003; 2012   2003: + esophagitis; negative H.pylori serology  2012: [name] mild gastritis, Bravo PH probe placement, negative H.pylori   GERD (gastroesophageal reflux disease)    Hepatic steatosis    History of hiatal hernia    Hyperlipemia    Overweight    Panic attacks    Paroxysmal atrial fibrillation (HCC)    Stroke ([facility]    TIA (transient ischemic attack)    Type II diabetes mellitus ([facility]     Past Surgical History:  Procedure Laterality Date   [facility]   [id number]/normal esophagus and duodenum   CARDIAC CATHETERIZATION   1990s X 1; 2005; 08/12/2015   CARDIAC CATHETERIZATION N/A 08/12/2015   Procedure: Left Heart Cath and Coronary Angiography;  Surgeon: HBelva Crome MD; LAD 20%, CFX 65%, RCA 20%, EF 60%    COLONOSCOPY  1990   COLONOSCOPY WITH PROPOFOL N/A 11/21/2016   [name] non-thrombosed external hemorrhoids, one 6 mm polyp (polypoid lesion), internal hemorrhoids. TI Normal. 10 years screening   ESOPHAGOGASTRODUODENOSCOPY  05/03/2011   [id number]   NECK MASS EXCISION Right    "done in dr's office; behind right ear/side of ncek"   POLYPECTOMY  11/21/2016   Procedure: POLYPECTOMY;  Surgeon: FDanie Binder MD;  Location: [facility];  Service: Endoscopy;;  descending colon polyp   SHOULDER ARTHROSCOPY W/ ROTATOR CUFF REPAIR Right 2006   acromioclavicular joint arthrosis     Current Outpatient Medications  Medication Sig Dispense Refill   ALPRAZolam (XANAX) 0.5 MG tablet Take one tablet by mouth three times a day and two tablets at night (Patient taking differently: Take 0.5-1 mg by mouth See admin instructions. Take one tablet by mouth three times a day and two tablets at night) 150 tablet 2   aluminum-magnesium hydroxide 200-200 MG/5ML suspension Take by mouth every 6 (six)  hours as needed for indigestion.     amLODipine (NORVASC) 10 MG tablet Take 1 tablet (10 mg total) by mouth daily. 90 tablet 1   budesonide-formoterol (SYMBICORT) 80-4.5 MCG/ACT inhaler Inhale 2 puffs into the lungs 2 (two) times daily as needed.     clindamycin (CLEOCIN) 75 MG/5ML solution Take 300 mg by mouth 2 (two) times daily.     cyclobenzaprine (FLEXERIL) 10 MG tablet Take 1 tablet (10 mg total) by mouth as needed for muscle spasms.     esomeprazole (NEXIUM) 40 MG capsule Take 1 capsule (40 mg total) by mouth 2 (two) times daily before a meal. (Patient taking differently: Take 40 mg by mouth daily.) 60 capsule 5   JANUVIA 100 MG tablet TAKE 1 Tablet BY MOUTH ONCE DAILY (Patient taking differently: Take 100 mg by mouth  daily.) 90 tablet 1   lidocaine (XYLOCAINE) 2 % solution Use as directed 15 mLs in the mouth or throat every 3 (three) hours as needed for mouth pain (gargle swish and spit  as needed for dental pain). 100 mL 0   metoprolol succinate (TOPROL-XL) 50 MG 24 hr tablet Take 1 tablet (50 mg total) by mouth daily. 135 tablet 3   nitroGLYCERIN (NITROSTAT) 0.4 MG SL tablet Place 1 tablet (0.4 mg total) under the tongue every 5 (five) minutes x 3 doses as needed for chest pain (if no relief after 3rd dose, proceed to the ED for an evaluation). 25 tablet 3   PROVENTIL HFA 108 (90 Base) MCG/ACT inhaler INHALE 2 PUFFS BY MOUTH EVERY 6 HOURS AS NEEDED FOR COUGHING, WHEEZING, OR SHORTNESS OF BREATH (Patient taking differently: Inhale 2 puffs into the lungs every 6 (six) hours as needed for wheezing or shortness of breath.) 13.4 g 1   simvastatin (ZOCOR) 20 MG tablet TAKE 1 TABLET BY MOUTH AT BEDTIME (Patient taking differently: Take 20 mg by mouth at bedtime.) 30 tablet 6   sulfamethoxazole-trimethoprim (BACTRIM DS) 800-160 MG tablet Take 1 tablet by mouth 2 (two) times daily. 20 tablet 0   trolamine salicylate (ASPERCREME) 10 % cream Apply 1 application topically 2 (two) times daily as needed for muscle pain.     XARELTO 20 MG TABS tablet TAKE 1 Tablet BY MOUTH ONCE EVERY DAY WITH SUPPER (Patient taking differently: Take 20 mg by mouth daily with supper.) 90 tablet 1   No current facility-administered medications for this visit.    Allergies:   Dexilant [dexlansoprazole], Mushroom ext cmplx(shiitake-reishi-mait), Penicillins, and Doxycycline    Social History:  The patient  reports that he has been smoking cigarettes. He started smoking about 38 years ago. He has a 6.25 pack-year smoking history. He has never used smokeless tobacco. He reports that he does not drink alcohol and does not use drugs.   Family History:  The patient's family history includes ADD / ADHD in his daughter; Alcohol abuse in his father and  mother; Anxiety disorder in his sister and sister; Dementia in his cousin and paternal uncle; Depression in his sister; Diabetes in his brother and father; Heart attack (age of onset: 58) in his brother; Heart attack (age of onset: 49) in his father; Hypertension in his brother, brother, and brother; Lung cancer in his mother; Seizures in his brother.    ROS:  General:no colds or fevers, some weight loss Skin:no rashes or ulcers HEENT:no blurred vision, no congestion CV:see HPI PUL:see HPI GI:no diarrhea constipation or melena, no indigestion GU:no hematuria, no dysuria MS:no joint pain, no claudication Neuro:no  syncope, no lightheadedness Endo:+ diabetes, no thyroid disease  Wt Readings from Last 3 Encounters:  03/25/21 187 lb 9.6 oz (85.1 kg)  01/24/21 192 lb (87.1 kg)  01/04/21 191 lb 3.2 oz (86.7 kg)     PHYSICAL EXAM: VS:  BP 120/76   Pulse 60   Ht '5\' 11"'$  (1.803 m)   Wt 187 lb 9.6 oz (85.1 kg)   SpO2 98%   BMI 26.16 kg/m  , BMI Body mass index is 26.16 kg/m. General:Pleasant affect, NAD Skin:Warm and dry, brisk capillary refill HEENT:normocephalic, sclera clear, mucus membranes moist Neck:supple, no JVD, no bruits  Heart:S1S2 RRR without murmur, gallup, rub or click Lungs:clear without rales,+rare  rhonchi, no wheezes [id number], non tender, + BS, do not palpate liver spleen or masses Ext:no lower ext edema, 2+ pedal pulses, 2+ radial pulses Neuro:alert and oriented, MAE, follows commands, + facial symmetry    EKG:  EKG is ordered today. The ekg ordered today demonstrates SB at 56 otherwise normal EKG    Recent Labs: 10/01/2020: TSH 3.119 10/29/2020: Magnesium 2.0 03/14/2021: ALT 19; BUN 14; Creatinine, Ser 1.05; Hemoglobin 16.8; Platelets 191; Potassium 4.2; Sodium 136    Lipid Panel    Component Value Date/Time   CHOL 114 01/21/2021 1030   TRIG 114 01/21/2021 1030   HDL 30 (L) 01/21/2021 1030   CHOLHDL 3.8 01/21/2021 1030   VLDL 23 01/21/2021 1030    LDLCALC 61 01/21/2021 1030       Other studies Reviewed: Additional studies/ records that were reviewed today include: . Echo 07/2015  Dist Cx lesion, 65% stenosed. Mid LAD to Dist LAD lesion, 20% stenosed. Prox RCA to Dist RCA lesion, 20% stenosed.   Diffuse nonobstructive atherosclerosis of all 3 coronary arteries. After intracoronary nitroglycerin, a 50-70% distal circumflex stenosis was identified. It does not appear to be hemodynamically significant. Normal left ventricular systolic function with ejection fraction of 60%. EDP is up at normal.     RECOMMENDATIONS:   Aggressive risk factor modification Smoking cessation ASSESSMENT AND PLAN:  1.  PAF will have pt wear a monitor 14 day zio live.  To help determine treatment. If a fib last longer than an hour he will hake an extra 25 of toprol.   2.  Tachy brady syndrome.  Zio patch and then EP appt for further direction.  Pt has been intolerant to flecainide in past.   3.  Anticoagulation on xarelto with no bleeding.   4. Chest pain a knot.  His stress test was neg.  Occurs mostly after meals he is to have EGD in future.  5.  Tobacco use, asked to decrease - he does not believe he can stop currently.      6.  Hx CAD and last nuc neg.    7.  HLD on statin.  In July LDLof 61, HDL 30   Current medicines are reviewed with the patient today.  The patient Has no concerns regarding medicines.  The following changes have been made:  See above Labs/ tests ordered today include:see above  Disposition:   FU:  see above  Signed, Cecilie Kicks, NP  03/27/2021 10:56 PM    [address], [place], [place], [place] Phone: [phone]; Fax: [phone]

## 2021-03-25 NOTE — Patient Instructions (Signed)
Medication Instructions:  Your physician recommends that you continue on your current medications as directed. Please refer to the Current Medication list given to you today.  *If you need a refill on your cardiac medications before your next appointment, please call your pharmacy*   Lab Work: NONE   If you have labs (blood work) drawn today and your tests are completely normal, you will receive your results only by: [facility] (if you have MyChart) OR A paper copy in the mail If you have any lab test that is abnormal or we need to change your treatment, we will call you to review the results.   Testing/Procedures: [facility]- Long Term Monitor Instructions   Your physician has requested you wear your ZIO patch monitor___14__days.   This is a single patch monitor.  [employer] supplies one patch monitor per enrollment.  Additional stickers are not available.   Please do not apply patch if you will be having a Nuclear Stress Test, Echocardiogram, Cardiac CT, MRI, or Chest Xray during the time frame you would be wearing the monitor. The patch cannot be worn during these tests.  You cannot remove and re-apply the ZIO XT patch monitor.   Your ZIO patch monitor will be sent USPS Priority mail from [facility] directly to your home address. The monitor may also be mailed to a PO BOX if home delivery is not available.   It may take 3-5 days to receive your monitor after you have been enrolled.   Once you have received you monitor, please review enclosed instructions.  Your monitor has already been registered assigning a specific monitor serial # to you.   Applying the monitor   Shave hair from upper left chest.   Hold abrader disc by orange tab.  Rub abrader in 40 strokes over left upper chest as indicated in your monitor instructions.   Clean area with 4 enclosed alcohol pads .  Use all pads to assure are is cleaned thoroughly.  Let dry.   Apply patch as indicated in monitor  instructions.  Patch will be place under collarbone on left side of chest with arrow pointing upward.   Rub patch adhesive wings for 2 minutes.Remove white label marked "1".  Remove white label marked "2".  Rub patch adhesive wings for 2 additional minutes.   While looking in a mirror, press and release button in center of patch.  A small green light will flash 3-4 times .  This will be your only indicator the monitor has been turned on.     Do not shower for the first 24 hours.  You may shower after the first 24 hours.   Press button if you feel a symptom. You will hear a small click.  Record Date, Time and Symptom in the Patient Log Book.   When you are ready to remove patch, follow instructions on last 2 pages of Patient Log Book.  Stick patch monitor onto last page of Patient Log Book.   Place Patient Log Book in [facility] box.  Use locking tab on box and tape box closed securely.  The [employer] and [employer] has [employer] on it.  Please place in mailbox as soon as possible.  Your physician should have your test results approximately 7 days after the monitor has been mailed back to [facility]   Call [facility] at [phone] if you have questions regarding your ZIO XT patch monitor.  Call them immediately if you see an orange  light blinking on your monitor.   If your monitor falls off in less than 4 days contact our Monitor department at [phone].  If your monitor becomes loose or falls off after 4 days call [employer] at [phone] for suggestions on securing your monitor.     Follow-Up: At [facility], you and your health needs are our priority.  As part of our continuing mission to provide you with exceptional heart care, we have created designated Provider Care Teams.  These Care Teams include your primary Cardiologist (physician) and Advanced Practice Providers (APPs -  Physician Assistants and Nurse Practitioners) who all work together to provide you with  the care you need, when you need it.  We recommend signing up for the patient portal called "MyChart".  Sign up information is provided on this After Visit Summary.  MyChart is used to connect with patients for Virtual Visits (Telemedicine).  Patients are able to view lab/test results, encounter notes, upcoming appointments, etc.  Non-urgent messages can be sent to your provider as well.   To learn more about what you can do with MyChart, go to [email or web address].    Your next appointment:    Next Available   The format for your next appointment:   In Person  Provider:   Cristopher Peru, MD   Other Instructions Thank you for choosing [facility]!

## 2021-03-27 ENCOUNTER — Encounter: Payer: Self-pay | Admitting: Cardiology

## 2021-03-28 ENCOUNTER — Other Ambulatory Visit: Payer: Self-pay | Admitting: Physician Assistant

## 2021-03-29 ENCOUNTER — Encounter: Payer: Self-pay | Admitting: *Deleted

## 2021-03-31 ENCOUNTER — Telehealth: Payer: Self-pay | Admitting: Cardiology

## 2021-03-31 NOTE — Telephone Encounter (Signed)
Left a message for the [facility] to return my call for a pre-op clearance.

## 2021-03-31 NOTE — Telephone Encounter (Signed)
What dental office are you calling from? [facility]  What is your office phone number? [phone]   What is your fax number?[phone]  What type of procedure is the patient having performed? Extraction, root canal  ?   What date is procedure scheduled or is the patient there now? 04/05/21   What is your question (ex. Antibiotics prior to procedure, holding medication-we need to know how long dentist wants pt to hold med)? Please advise if patient needs premed or to hold any medication , has hx of stroke and on heart medication    (If the patient is currently at the dentist's office, call Pre-Op APP to address. If the patient is not currently in the dentist office, please route to the Pre-Op pool)

## 2021-03-31 NOTE — Telephone Encounter (Signed)
Clarification regarding the number of extractions needed.  Will route to call back pool for clarification.

## 2021-04-01 NOTE — Telephone Encounter (Signed)
2nd attempt to reach requesting office regarding clearance.  Left another message.  Will route back to their office via fax and ask them to send over the needed information requested to complete clearance.

## 2021-04-04 ENCOUNTER — Other Ambulatory Visit: Payer: Self-pay | Admitting: Gastroenterology

## 2021-04-04 ENCOUNTER — Telehealth: Payer: Self-pay | Admitting: Internal Medicine

## 2021-04-04 ENCOUNTER — Ambulatory Visit: Payer: Self-pay | Admitting: *Deleted

## 2021-04-04 DIAGNOSIS — K649 Unspecified hemorrhoids: Secondary | ICD-10-CM

## 2021-04-04 MED ORDER — HYDROCORTISONE (PERIANAL) 2.5 % EX CREA: 1.0000 "application " | TOPICAL_CREAM | Freq: Two times a day (BID) | CUTANEOUS | 0 refills | Status: DC

## 2021-04-04 NOTE — Telephone Encounter (Signed)
[facility] returning a call about the patient's dental clearance

## 2021-04-04 NOTE — Telephone Encounter (Signed)
Patient with diagnosis of afib on Xarelto for anticoagulation.    Procedure: 5 dental extractions Date of procedure: 04/05/21  CHA2DS2-VASc Score = 5  This indicates a 7.2% annual risk of stroke. The patient's score is based upon: CHF History: 0 HTN History: 1 Diabetes History: 1 Stroke History: 2 Vascular Disease History: 1 Age Score: 0 Gender Score: 0   CrCl 45mL/min Platelet count 191K  Patient does not require pre-op antibiotics for dental procedure.  Per office protocol, patient can hold Xarelto for 1 day (today) prior to procedure. He should resume Xarelto as soon as safely possible afterwards.

## 2021-04-04 NOTE — Telephone Encounter (Signed)
Dental office called back today and stated procedure set for tomorrow 04/05/21 is for 5 teeth to be extracted. Pt is on Xarelto. I will update pre op provider and pharm-D.

## 2021-04-04 NOTE — Telephone Encounter (Signed)
C/o constipation since this am . C/o stool is in rectal vault and unable to pass. Pain noted and no relief after sitz bath. Patient reports he was instructed to take miralax . Instructed patient to drink warm liquids and take miralax as directed by PCP. Patient reports hx hemorrhoids and denies rectal bleeding , no chest pain or dizziness or lightheadedness. Instructed patient not to strain to have BM. Hx heart issues reported and patient reports he is wearing a heart monitor. Recommended patient Could try warm soapy wash cloth to rectal area. Instructed patient and wife to monitor symptoms. Care advise given. Patent verbalized understanding of call back or go to ED if symptoms worsen.

## 2021-04-04 NOTE — Telephone Encounter (Signed)
Reason for Disposition  [1] Rectal pain or fullness from fecal impaction (rectum full of stool) AND [2] NOT better after SITZ bath, suppository or enema  Answer Assessment - Initial Assessment Questions 1. STOOL PATTERN OR FREQUENCY: "How often do you have a bowel movement (BM)?"  (Normal range: 3 times a day to every 3 days)  "When was your last BM?"       Everyday  2. STRAINING: "Do you have to strain to have a BM?"      Yes  3. RECTAL PAIN: "Does your rectum hurt when the stool comes out?" If Yes, ask: "Do you have hemorrhoids? How bad is the pain?"  (Scale 1-10; or mild, moderate, severe)     Yes  4. STOOL COMPOSITION: "Are the stools hard?"      Yes  5. BLOOD ON STOOLS: "Has there been any blood on the toilet tissue or on the surface of the BM?" If Yes, ask: "When was the last time?"      Na  6. CHRONIC CONSTIPATION: "Is this a new problem for you?"  If no, ask: "How long have you had this problem?" (days, weeks, months)      Has had hemorrhoids in the past  7. CHANGES IN DIET OR HYDRATION: "Have there been any recent changes in your diet?" "How much fluids are you drinking on a daily basis?"  "How much have you had to drink today?"     Was taking antibiotics  8. MEDICATIONS: "Have you been taking any new medications?" "Are you taking any narcotic pain medications?" (e.g., Vicodin, Percocet, morphine, Dilaudid)     Antibiotics  9. LAXATIVES: "Have you been using any stool softeners, laxatives, or enemas?"  If yes, ask "What, how often, and when was the last time?"     Recommended to take miralax today  10. ACTIVITY:  "How much walking do you do every day?"  "Has your activity level decreased in the past week?"        Na  11. CAUSE: "What do you think is causing the constipation?"        Antibiotics  12. OTHER SYMPTOMS: "Do you have any other symptoms?" (e.g., abdominal pain, bloating, fever, vomiting)       Pain in rectum , 13. MEDICAL HISTORY: "Do you have a history of hemorrhoids,  rectal fissures, or rectal surgery or rectal abscess?"         Hemorrhoids , hx heart issues and wearing heart monitor 14. PREGNANCY: "Is there any chance you are pregnant?" "When was your last menstrual period?"       na  Protocols used: Constipation-A-AH

## 2021-04-04 NOTE — Telephone Encounter (Signed)
Floria Raveling I, CMA  Erenest Rasher, [place] Phoned and spoke with the pt, currently he is not on anything for constipation. He states he didn't have bowel movement yesterday and today he know he has to go and its "right there" but his anal opening is swollen and he is scared to push it through. He doesn't want to strain. I asked him does he have hemorrhoids and he stated yes but not on anything for them. States he does hemorrhoid cream that is OTC. I advised of the Miralax and the directions. Pt would like something sent in for his hemorrhoids. (The pt just finished a 2 week course of liquid clindamycin for a bad tooth).    Dena: I agree, avoid straining, limit toilet time to 2-3 minutes.  I will send in Rx for anusol cream. He can apply this to his rectally twice daily. Further recommendations when [name], NP returns tomorrow.  He can also try Sitz baths twice daily.  Further recommendations per [name], NP when she returns.

## 2021-04-04 NOTE — Telephone Encounter (Addendum)
Is he currently taking anything for constipation?  If not, he can try MiraLAX 17 g daily in 8 ounces of water.

## 2021-04-04 NOTE — Telephone Encounter (Signed)
Pt is having issues with constipation and I do not feel comfortable telling him about what to try due to his medical conditions. Please advise.

## 2021-04-04 NOTE — Telephone Encounter (Signed)
Phoned and advised the pt to avoid straining and limiting toilet time, advised of Rx being sent in and directions of Rx, advised of sitz baths and how often. Further recommendations to follow when [name] returns.

## 2021-04-04 NOTE — Telephone Encounter (Signed)
See addendum notification

## 2021-04-04 NOTE — Telephone Encounter (Signed)
PLEASE CALL PATIENT, HE IS HAVING CONSTIPATION AND NEEDS SUGGESTIONS ON WHAT TO TAKE

## 2021-04-04 NOTE — Telephone Encounter (Signed)
Routing back to Lane.

## 2021-04-04 NOTE — Telephone Encounter (Signed)
   Primary Cardiologist: Rozann Lesches, MD  Chart reviewed as part of pre-operative protocol coverage. Given past medical history and time since last visit, based on ACC/AHA guidelines, Danile L Austell would be at acceptable risk for the planned procedure without further cardiovascular testing.   Patient with diagnosis of afib on Xarelto for anticoagulation.     Procedure: 5 dental extractions Date of procedure: 04/05/21   CHA2DS2-VASc Score = 5  This indicates a 7.2% annual risk of stroke. The patient's score is based upon: CHF History: 0 HTN History: 1 Diabetes History: 1 Stroke History: 2 Vascular Disease History: 1 Age Score: 0 Gender Score: 0   CrCl 5m/min Platelet count 191K   Patient does not require pre-op antibiotics for dental procedure.   Per office protocol, patient can hold Xarelto for 1 day (today) prior to procedure. He should resume Xarelto as soon as safely possible afterwards.  I will route this recommendation to the requesting party via Epic fax function and remove from pre-op pool.  Please call with questions.  JJossie Ng Adriell Polansky NP-C    04/04/2021, 11:19 AM [id number] Office ([phone] [phone]

## 2021-04-05 ENCOUNTER — Other Ambulatory Visit: Payer: Self-pay

## 2021-04-05 ENCOUNTER — Encounter (HOSPITAL_COMMUNITY): Payer: Self-pay

## 2021-04-05 ENCOUNTER — Emergency Department (HOSPITAL_COMMUNITY)
Admission: EM | Admit: 2021-04-05 | Discharge: 2021-04-05 | Disposition: A | Discharge status: Home / Self Care | Payer: Self-pay | Attending: Emergency Medicine | Admitting: Emergency Medicine

## 2021-04-05 ENCOUNTER — Emergency Department (HOSPITAL_COMMUNITY): Payer: Self-pay

## 2021-04-05 ENCOUNTER — Telehealth: Payer: Self-pay | Admitting: Internal Medicine

## 2021-04-05 DIAGNOSIS — J45909 Unspecified asthma, uncomplicated: Secondary | ICD-10-CM | POA: Insufficient documentation

## 2021-04-05 DIAGNOSIS — F17210 Nicotine dependence, cigarettes, uncomplicated: Secondary | ICD-10-CM | POA: Insufficient documentation

## 2021-04-05 DIAGNOSIS — I2510 Atherosclerotic heart disease of native coronary artery without angina pectoris: Secondary | ICD-10-CM | POA: Insufficient documentation

## 2021-04-05 DIAGNOSIS — J449 Chronic obstructive pulmonary disease, unspecified: Secondary | ICD-10-CM | POA: Insufficient documentation

## 2021-04-05 DIAGNOSIS — I480 Paroxysmal atrial fibrillation: Secondary | ICD-10-CM | POA: Insufficient documentation

## 2021-04-05 DIAGNOSIS — Z79899 Other long term (current) drug therapy: Secondary | ICD-10-CM | POA: Insufficient documentation

## 2021-04-05 DIAGNOSIS — K5900 Constipation, unspecified: Secondary | ICD-10-CM

## 2021-04-05 DIAGNOSIS — Z7984 Long term (current) use of oral hypoglycemic drugs: Secondary | ICD-10-CM | POA: Insufficient documentation

## 2021-04-05 DIAGNOSIS — Z7901 Long term (current) use of anticoagulants: Secondary | ICD-10-CM | POA: Insufficient documentation

## 2021-04-05 DIAGNOSIS — I10 Essential (primary) hypertension: Secondary | ICD-10-CM | POA: Insufficient documentation

## 2021-04-05 DIAGNOSIS — E119 Type 2 diabetes mellitus without complications: Secondary | ICD-10-CM | POA: Insufficient documentation

## 2021-04-05 MED ORDER — POLYETHYLENE GLYCOL 3350 17 G PO PACK: 17.0000 g | PACK | Freq: Every day | ORAL | 0 refills | Status: DC | PRN

## 2021-04-05 NOTE — Telephone Encounter (Signed)
FYI: Returned the pt's call and advised the pt to continue the miralax until he gets a regular and normal routine of bowel movements. He agreed. Then the pt proceeds to tell me that the opening of his anal canal is not letting anything pass through. I reminded/advised of the recommendations from yesterday and he advised he had cream a home to use but I advised him this is prescription strength and he needs to use it. (Pt also had phoned nurse triage late yesterday evening with the same issue). I advised the pt that if his anal area is that swollen he needs to go to the ED incase he needs to be de-compact him as the triage nurse had advised the pt to do.

## 2021-04-05 NOTE — Telephone Encounter (Signed)
Noted  Pt expressed understanding of this.

## 2021-04-05 NOTE — Telephone Encounter (Signed)
Continue Miralax in full glass of 8 ounces water daily as needed.

## 2021-04-05 NOTE — Telephone Encounter (Signed)
PATIENT WAS TOLD YESTERDAY TO TAKE MIRILAX FOR HIS CONSTIPATION BUT HE WANTS TO KNOW HOW LONG HE SHOULD TAKE IT FOR

## 2021-04-05 NOTE — ED Notes (Signed)
Pt verbalized a 3 cm x 1 cm piece of stool.

## 2021-04-05 NOTE — ED Provider Notes (Signed)
[facility] EMERGENCY DEPARTMENT Provider Note   CSN: [id number] Arrival date & time: 04/05/21  1239     History Chief Complaint  Patient presents with   Constipation    Richard [name] is a 55 y.o. male.   Constipation Associated symptoms: no abdominal pain, no back pain and no fever   Patient presents with constipation.  Has had about 3 days without a bowel movement.  States he just finished up some clindamycin for dental pain/infection.  States after clindamycin he developed constipation.  States he feels as if he has to go but he cannot go.  States it is somewhat painful to sit down.  States he normally has bowel movement about once a day.  No fevers or chills.  Abdomen does not hurt but states he does feel little fluttering at times in his abdomen.  He is on blood thinners for his atrial fibrillation.  Has a previous history of hemorrhoids.  Had a dose of MiraLAX yesterday.  Patient called his PCP and was told to come into the ER.    Past Medical History:  Diagnosis Date   Arthritis    Asthma    CAD (coronary artery disease)    a. Cardiac cath 07/2015 showed 65% distal Cx, 20% mid-distal LAD, 20% prox-distal RCA, EF 60%, EDP 64mmHg.   Colitis 1990   COPD (chronic obstructive pulmonary disease) ([place])    Depression    Essential hypertension    Gastric ulcer 2003; 2012   2003: + esophagitis; negative H.pylori serology  2012: Dr. Oneida Alar, mild gastritis, Bravo PH probe placement, negative H.pylori   GERD (gastroesophageal reflux disease)    Hepatic steatosis    History of hiatal hernia    Hyperlipemia    Overweight    Panic attacks    Paroxysmal atrial fibrillation ([place])    Stroke ([place])    TIA (transient ischemic attack)    Type II diabetes mellitus [facility])     Patient Active Problem List   Diagnosis Date Noted   Paroxysmal atrial fibrillation (HCC)    Lateral epicondylitis, left elbow 03/27/2019   Globus sensation 02/21/2018   Other cervical disc degeneration,  unspecified cervical region 01/11/2018   Tendinitis of right triceps 01/11/2018   Neck pain 12/27/2017   Lateral epicondylitis, right elbow 05/10/2017   Constipation 12/24/2015   Dyspnea 11/17/2015   Coronary artery disease due to lipid rich plaque    Heart palpitations 08/12/2015   TIA (transient ischemic attack) 08/12/2015   Colon cancer screening 08/02/2015   Abdominal pain 12/18/2014   Encounter for screening colonoscopy 12/18/2014   Unspecified vitamin D deficiency 08/20/2012   Arteriosclerotic cardiovascular disease (ASCVD) 04/11/2012   Chronic low back pain    Essential hypertension    Anxiety and depression    Controlled diabetes mellitus type 2 with complications ([place]) 53/66/4403   Cigarette smoker 09/14/2010   CHRONIC OBSTRUCTIVE PULMONARY DISEASE 09/14/2010   HLD (hyperlipidemia) 11/25/2009   GERD (gastroesophageal reflux disease) 04/05/2009   Hepatic steatosis 04/05/2009    Past Surgical History:  Procedure Laterality Date   BRAVO [place] STUDY  05/03/2011   KVQ:QVZD gastritis/normal esophagus and duodenum   CARDIAC CATHETERIZATION  1990s X 1; 2005; 08/12/2015   CARDIAC CATHETERIZATION N/A 08/12/2015   Procedure: Left Heart Cath and Coronary Angiography;  Surgeon: Belva Crome, MD; LAD 20%, CFX 65%, RCA 20%, EF 60%    COLONOSCOPY  1990   COLONOSCOPY WITH PROPOFOL N/A 11/21/2016   Dr. Oneida Alar: non-thrombosed external hemorrhoids, one  6 mm polyp (polypoid lesion), internal hemorrhoids. TI Normal. 10 years screening   ESOPHAGOGASTRODUODENOSCOPY  05/03/2011   GUY:QIHK gastritis   NECK MASS EXCISION Right    "done in dr's office; behind right ear/side of ncek"   POLYPECTOMY  11/21/2016   Procedure: POLYPECTOMY;  Surgeon: [name], MD;  Location: [facility] ENDO SU[facility];  Service: Endoscopy;;  descending colon polyp   SHOULDER ARTHROSCOPY W/ ROTATOR CUFF REPAIR Right 2006   acromioclavicular joint arthrosis       Family History  Problem Relation Age of Onset   Lung cancer  Mother    Alcohol abuse Mother    Heart attack Father 80   Diabetes Father    Alcohol abuse Father    Hypertension Brother    Hypertension Brother    Anxiety disorder Sister    Depression Sister    Anxiety disorder Sister    Heart attack Brother 8   Diabetes Brother    Hypertension Brother    Seizures Brother    Dementia Paternal Uncle    Dementia Cousin    ADD / ADHD Daughter    Colon cancer Neg Hx    Drug abuse Neg Hx    Bipolar disorder Neg Hx    OCD Neg Hx    Paranoid behavior Neg Hx    Schizophrenia Neg Hx    Sexual abuse Neg Hx    Physical abuse Neg Hx     Social History   Tobacco Use   Smoking status: Every Day    Packs/day: 0.25    Years: 25.00    Pack years: 6.25    Types: Cigarettes    Start date: 07/17/1982   Smokeless tobacco: Never  Vaping Use   Vaping Use: Never used  Substance Use Topics   Alcohol use: No    Alcohol/week: 0.0 standard drinks   Drug use: No    Home Medications Prior to Admission medications   Medication Sig Start Date End Date Taking? Authorizing Provider  polyethylene glycol (MIRALAX / GLYCOLAX) 17 g packet Take 17 g by mouth daily as needed. 04/05/21  Yes Davonna Belling, MD  ALPRAZolam Duanne Moron) 0.5 MG tablet Take one tablet by mouth Richard times a day and two tablets at night Patient taking differently: Take 0.5-1 mg by mouth See admin instructions. Take one tablet by mouth Richard times a day and two tablets at night 11/24/20   Cloria Spring, MD  aluminum-magnesium hydroxide 200-200 MG/5ML suspension Take by mouth every 6 (six) hours as needed for indigestion.    [provider]  amLODipine (NORVASC) 10 MG tablet Take 1 tablet (10 mg total) by mouth daily. 03/23/21   Satira Sark, MD  budesonide-formoterol [facility]) 80-4.5 MCG/ACT inhaler Inhale 2 puffs into the lungs 2 (two) times daily as needed.    [provider]  clindamycin (CLEOCIN) 75 MG/5ML solution Take 300 mg by mouth 2 (two) times daily.     [provider]  cyclobenzaprine (FLEXERIL) 10 MG tablet Take 1 tablet (10 mg total) by mouth as needed for muscle spasms. 10/01/20   Johnson, Clanford L, MD  esomeprazole (NEXIUM) 40 MG capsule Take 1 capsule (40 mg total) by mouth 2 (two) times daily before a meal. Patient taking differently: Take 40 mg by mouth daily. 06/03/20   Annitta Needs, NP  hydrocortisone (ANUSOL-HC) 2.5 % rectal cream Place 1 application rectally 2 (two) times daily. 04/04/21   Erenest Rasher, PA-C  JANUVIA 100 MG tablet TAKE 1  Tablet BY MOUTH ONCE DAILY Patient taking differently: Take 100 mg by mouth daily. 01/24/21   [name], PA-C  lidocaine (XYLOCAINE) 2 % solution Use as directed 15 mLs in the mouth or throat every 3 (Richard) hours as needed for mouth pain (gargle swish and spit  as needed for dental pain). 01/22/21   Scot Jun, FNP  metoprolol succinate (TOPROL-XL) 50 MG 24 hr tablet Take 1 tablet (50 mg total) by mouth daily. 10/01/20   Johnson, Clanford L, MD  nitroGLYCERIN (NITROSTAT) 0.4 MG SL tablet Place 1 tablet (0.4 mg total) under the tongue every 5 (five) minutes x 3 doses as needed for chest pain (if no relief after 3rd dose, proceed to the ED for an evaluation). 02/02/20   Allred, Jeneen Rinks, MD  PROVENTIL HFA 108 (90 Base) MCG/ACT inhaler INHALE 2 PUFFS BY MOUTH EVERY 6 HOURS AS NEEDED FOR COUGHING, WHEEZING, OR SHORTNESS OF BREATH Patient taking differently: Inhale 2 puffs into the lungs every 6 (six) hours as needed for wheezing or shortness of breath. 01/24/21   [name], PA-C  simvastatin (ZOCOR) 20 MG tablet TAKE 1 TABLET BY MOUTH AT BEDTIME 03/28/21   [name], PA-C  sulfamethoxazole-trimethoprim (BACTRIM DS) 800-160 MG tablet Take 1 tablet by mouth 2 (two) times daily. 01/22/21   Scot Jun, FNP  trolamine salicylate (ASPERCREME) 10 % cream Apply 1 application topically 2 (two) times daily as needed for muscle pain.    [provider]  XARELTO 20 MG  TABS tablet TAKE 1 Tablet BY MOUTH ONCE EVERY DAY WITH SUPPER Patient taking differently: Take 20 mg by mouth daily with supper. 01/19/21   Satira Sark, MD    Allergies    Dexilant [dexlansoprazole], Mushroom ext cmplx(shiitake-reishi-mait), Penicillins, and Doxycycline  Review of Systems   Review of Systems  Constitutional:  Negative for appetite change and fever.  HENT:  Negative for congestion.   Respiratory:  Negative for shortness of breath.   Cardiovascular:  Negative for chest pain.  Gastrointestinal:  Positive for constipation. Negative for abdominal pain.  Genitourinary:  Negative for flank pain.  Musculoskeletal:  Negative for back pain.  Skin:  Negative for rash.  Neurological:  Negative for weakness.  Psychiatric/Behavioral:  Negative for confusion.    Physical Exam Updated Vital Signs BP (!) 143/75 (BP Location: Right Arm)   Pulse (!) 54   Temp 97.8 F (36.6 C)   Resp 20   Ht 5\' 11"  (1.803 m)   Wt 84.2 kg   SpO2 100%   BMI 25.89 kg/m   Physical Exam Vitals and nursing note reviewed.  HENT:     Head: Atraumatic.     Mouth/Throat:     Mouth: Mucous membranes are moist.  Eyes:     Pupils: Pupils are equal, round, and reactive to light.  Cardiovascular:     Rate and Rhythm: Normal rate.  Pulmonary:     Breath sounds: No wheezing or rhonchi.  Abdominal:     Tenderness: There is no abdominal tenderness.  Genitourinary:    Comments: Some external hemorrhoids.  No tenderness on rectal exam but did show a fair amount of soft stools.  It was broken up with finger. Musculoskeletal:        General: No tenderness.     Cervical back: Neck supple.  Skin:    General: Skin is warm.     Capillary Refill: Capillary refill takes less than 2 seconds.  Neurological:     Mental Status:  He is alert and oriented to person, place, and time.    ED Results / Procedures / Treatments   Labs (all labs ordered are listed, but only abnormal results are displayed) Labs  Reviewed - No data to display  EKG None  Radiology DG Abd 2 Views  Result Date: 04/05/2021 CLINICAL DATA:  Constipation and abdominal pain. EXAM: ABDOMEN - 2 VIEW COMPARISON:  None. FINDINGS: The bowel gas pattern is normal. A large amount of stool is seen throughout the colon. There is no evidence of free air. No radio-opaque calculi or other significant radiographic abnormality is seen. IMPRESSION: 1. Large stool burden without evidence of bowel obstruction. Electronically Signed   By: Virgina Norfolk M.D.   On: 04/05/2021 18:28    Procedures Procedures   Medications Ordered in ED Medications - No data to display  ED Course  I have reviewed the triage vital signs and the nursing notes.  Pertinent labs & imaging results that were available during my care of the patient were reviewed by me and considered in my medical decision making (see chart for details).    MDM Rules/Calculators/A&P                           Patient presents with constipation.  Began after being on clindamycin.  Rectal exam done and showed large amount of stool.  Broken up with finger but not a full disimpaction.  Patient felt better and had a bowel movement.  X-ray done and did show fair amount of stool.  Benign abdominal exam and doubt colitis to go along with this.  Will discharge home some MiraLAX to help with the constipation.  Outpatient follow-up as needed.  No obstruction seen Final Clinical Impression(s) / ED Diagnoses Final diagnoses:  Constipation  Constipation, unspecified constipation type    Rx / DC Orders ED Discharge Orders          Ordered    polyethylene glycol (MIRALAX / GLYCOLAX) 17 g packet  Daily PRN        04/05/21 1903             Davonna Belling, MD 04/05/21 2335

## 2021-04-05 NOTE — Progress Notes (Signed)
error 

## 2021-04-05 NOTE — ED Triage Notes (Signed)
Pt presents to ED with complaints of constipation x 3 days. Pt states his rectum is swollen and unable to pass stool.

## 2021-04-05 NOTE — Discharge Instructions (Signed)
MiraLAX may help with constipation.  You can take 1 dose a day and if that does not work and you can try two doses a day

## 2021-04-05 NOTE — Telephone Encounter (Signed)
See note added to yesterday

## 2021-04-06 ENCOUNTER — Other Ambulatory Visit: Payer: Self-pay

## 2021-04-06 DIAGNOSIS — K5641 Fecal impaction: Secondary | ICD-10-CM | POA: Insufficient documentation

## 2021-04-06 DIAGNOSIS — I2510 Atherosclerotic heart disease of native coronary artery without angina pectoris: Secondary | ICD-10-CM | POA: Insufficient documentation

## 2021-04-06 DIAGNOSIS — J449 Chronic obstructive pulmonary disease, unspecified: Secondary | ICD-10-CM | POA: Insufficient documentation

## 2021-04-06 DIAGNOSIS — J45909 Unspecified asthma, uncomplicated: Secondary | ICD-10-CM | POA: Insufficient documentation

## 2021-04-06 DIAGNOSIS — E119 Type 2 diabetes mellitus without complications: Secondary | ICD-10-CM | POA: Insufficient documentation

## 2021-04-06 DIAGNOSIS — I10 Essential (primary) hypertension: Secondary | ICD-10-CM | POA: Insufficient documentation

## 2021-04-06 DIAGNOSIS — F17210 Nicotine dependence, cigarettes, uncomplicated: Secondary | ICD-10-CM | POA: Insufficient documentation

## 2021-04-07 ENCOUNTER — Other Ambulatory Visit: Payer: Self-pay

## 2021-04-07 ENCOUNTER — Encounter (HOSPITAL_COMMUNITY): Payer: Self-pay

## 2021-04-07 ENCOUNTER — Emergency Department (HOSPITAL_COMMUNITY): Payer: Self-pay

## 2021-04-07 ENCOUNTER — Emergency Department (HOSPITAL_COMMUNITY)
Admission: EM | Admit: 2021-04-07 | Discharge: 2021-04-07 | Disposition: A | Discharge status: Home / Self Care | Payer: Self-pay | Attending: Emergency Medicine | Admitting: Emergency Medicine

## 2021-04-07 DIAGNOSIS — K5641 Fecal impaction: Secondary | ICD-10-CM

## 2021-04-07 LAB — CBC
HCT: 44 % (ref 39.0–52.0)
Hemoglobin: 15.3 g/dL (ref 13.0–17.0)
MCH: 31.9 pg (ref 26.0–34.0)
MCHC: 34.8 g/dL (ref 30.0–36.0)
MCV: 91.7 fL (ref 80.0–100.0)
Platelets: 203 10*3/uL (ref 150–400)
RBC: 4.8 MIL/uL (ref 4.22–5.81)
RDW: 12.2 % (ref 11.5–15.5)
WBC: 6.4 10*3/uL (ref 4.0–10.5)
nRBC: 0 % (ref 0.0–0.2)

## 2021-04-07 LAB — BASIC METABOLIC PANEL
Anion gap: 8 (ref 5–15)
BUN: 13 mg/dL (ref 6–20)
CO2: 26 mmol/L (ref 22–32)
Calcium: 9 mg/dL (ref 8.9–10.3)
Chloride: 103 mmol/L (ref 98–111)
Creatinine, Ser: 0.98 mg/dL (ref 0.61–1.24)
GFR, Estimated: >60 mL/min (ref >60)
Glucose, Bld: 100 mg/dL — ABNORMAL HIGH (ref 70–99)
Potassium: 3.5 mmol/L (ref 3.5–5.1)
Sodium: 137 mmol/L (ref 135–145)

## 2021-04-07 MED ORDER — BISACODYL 10 MG RE SUPP: 10.0000 mg | RECTAL | 0 refills | Status: DC | PRN

## 2021-04-07 MED ORDER — IOHEXOL 350 MG/ML SOLN
80.0000 mL | Freq: Once | INTRAVENOUS | Status: AC | PRN
  Administered 2021-04-07: 80 mL via INTRAVENOUS

## 2021-04-07 NOTE — ED Notes (Signed)
ED Provider at bedside. 

## 2021-04-07 NOTE — Discharge Instructions (Addendum)
You were evaluated in the Emergency Department and after careful evaluation, we did not find any emergent condition requiring admission or further testing in the hospital.  Your exam/testing today was overall reassuring.  Symptoms likely due to fecal impaction.  Use metamucil and miralax as we discussed.  Used suppository as needed.  Please return to the Emergency Department if you experience any worsening of your condition.  Thank you for allowing [name] to be a part of your care.

## 2021-04-07 NOTE — ED Provider Notes (Signed)
[facility] Emergency Department Provider Note MRN:  [id number]  Arrival date & time: 04/07/21     Chief Complaint   Constipation   History of Present Illness   Richard [name] is a 55 y.o. year-old male with a history of CAD, GERD, diabetes presenting to the ED with chief complaint of constipation.  Location: Abdomen Duration: Several days/weeks Onset: Gradual Timing: Constant Description: Not having adequate bowel movements Severity: Moderate Exacerbating/Alleviating Factors: Not improving with MiraLAX Associated Symptoms: Bloating Pertinent Negatives: No fever, no chest pain, no shortness of breath, no rectal pain  Additional History: Feels like he has a large stool that he cannot pass  Review of Systems  A complete 10 system review of systems was obtained and all systems are negative except as noted in the HPI and PMH.   Patient's Health History    Past Medical History:  Diagnosis Date   Arthritis    Asthma    CAD (coronary artery disease)    a. Cardiac cath 07/2015 showed 65% distal Cx, 20% mid-distal LAD, 20% prox-distal RCA, EF 60%, EDP 56mmHg.   Colitis 1990   COPD (chronic obstructive pulmonary disease) ([place])    Depression    Essential hypertension    Gastric ulcer 2003; 2012   2003: + esophagitis; negative H.pylori serology  2012: Dr. Oneida Alar, mild gastritis, Bravo PH probe placement, negative H.pylori   GERD (gastroesophageal reflux disease)    Hepatic steatosis    History of hiatal hernia    Hyperlipemia    Overweight    Panic attacks    Paroxysmal atrial fibrillation (HCC)    Stroke ([place])    TIA (transient ischemic attack)    Type II diabetes mellitus ([place])     Past Surgical History:  Procedure Laterality Date   BRAVO [facility] STUDY  05/03/2011   DJT:TSVX gastritis/normal esophagus and duodenum   CARDIAC CATHETERIZATION  1990s X 1; 2005; 08/12/2015   CARDIAC CATHETERIZATION N/A 08/12/2015   Procedure: Left Heart Cath and Coronary  Angiography;  Surgeon: Belva Crome, MD; LAD 20%, CFX 65%, RCA 20%, EF 60%    COLONOSCOPY  1990   COLONOSCOPY WITH PROPOFOL N/A 11/21/2016   Dr. Oneida Alar: non-thrombosed external hemorrhoids, one 6 mm polyp (polypoid lesion), internal hemorrhoids. TI Normal. 10 years screening   ESOPHAGOGASTRODUODENOSCOPY  05/03/2011   BLT:JQZE gastritis   NECK MASS EXCISION Right    "done in dr's office; behind right ear/side of ncek"   POLYPECTOMY  11/21/2016   Procedure: POLYPECTOMY;  Surgeon: [name], MD;  Location: [facility];  Service: Endoscopy;;  descending colon polyp   SHOULDER ARTHROSCOPY W/ ROTATOR CUFF REPAIR Right 2006   acromioclavicular joint arthrosis    Family History  Problem Relation Age of Onset   Lung cancer Mother    Alcohol abuse Mother    Heart attack Father 23   Diabetes Father    Alcohol abuse Father    Hypertension Brother    Hypertension Brother    Anxiety disorder Sister    Depression Sister    Anxiety disorder Sister    Heart attack Brother 39   Diabetes Brother    Hypertension Brother    Seizures Brother    Dementia Paternal Uncle    Dementia Cousin    ADD / ADHD Daughter    Colon cancer Neg Hx    Drug abuse Neg Hx    Bipolar disorder Neg Hx    OCD Neg Hx    Paranoid behavior  Neg Hx    Schizophrenia Neg Hx    Sexual abuse Neg Hx    Physical abuse Neg Hx     Social History   Socioeconomic History   Marital status: Married    Spouse name: Not on file   Number of children: Not on file   Years of education: Not on file   Highest education level: Not on file  Occupational History   Occupation: full time    Employer: UNEMPLOYED  Tobacco Use   Smoking status: Every Day    Packs/day: 0.25    Years: 25.00    Pack years: 6.25    Types: Cigarettes    Start date: 07/17/1982   Smokeless tobacco: Never  Vaping Use   Vaping Use: Never used  Substance and Sexual Activity   Alcohol use: No    Alcohol/week: 0.0 standard drinks   Drug use: No   Sexual  activity: Yes    Birth control/protection: None  Other Topics Concern   Not on file  Social History Narrative   Pt lives in [place] with wife.  5 children.  Unemployed due to panic attacks and back pain   Social Determinants of Health   Financial Resource Strain: Not on file  Food Insecurity: Not on file  Transportation Needs: Not on file  Physical Activity: Not on file  Stress: Not on file  Social Connections: Not on file  Intimate Partner Violence: Not on file     Physical Exam   Vitals:   04/07/21 0130 04/07/21 0333  BP: 126/74 134/77  Pulse: (!) 50 (!) 51  Resp: 18 19  Temp:    SpO2: 97% 99%    CONSTITUTIONAL: Well-appearing, NAD NEURO:  Alert and oriented x 3, no focal deficits EYES:  eyes equal and reactive ENT/NECK:  no LAD, no JVD CARDIO: Regular rate, well-perfused, normal S1 and S2 PULM:  CTAB no wheezing or rhonchi GI/GU:  normal bowel sounds, non-distended, non-tender MSK/SPINE:  No gross deformities, no edema SKIN:  no rash, atraumatic PSYCH:  Appropriate speech and behavior  *Additional and/or pertinent findings included in MDM below  Diagnostic and Interventional Summary    EKG Interpretation  Date/Time:    Ventricular Rate:    PR Interval:    QRS Duration:   QT Interval:    QTC Calculation:   R Axis:     Text Interpretation:         Labs Reviewed  BASIC METABOLIC PANEL - Abnormal; Notable for the following components:      Result Value   Glucose, Bld 100 (*)    All other components within normal limits  CBC    CT ABDOMEN PELVIS W CONTRAST  Final Result      Medications  iohexol (OMNIPAQUE) 350 MG/ML injection 80 mL (80 mLs Intravenous Contrast Given 04/07/21 0302)     Procedures  /  Critical Care Fecal disimpaction  Date/Time: 04/07/2021 6:39 AM Performed by: Maudie Flakes, MD Authorized by: Maudie Flakes, MD  Consent: Verbal consent obtained. Risks and benefits: risks, benefits and alternatives were  discussed Consent given by: patient Patient identity confirmed: verbally with patient Time out: Immediately prior to procedure a "time out" was called to verify the correct patient, procedure, equipment, support staff and site/side marked as required. Local anesthesia used: no  Anesthesia: Local anesthesia used: no  Sedation: Patient sedated: no  Patient tolerance: patient tolerated the procedure well with no immediate complications    ED Course and Medical  Decision Making  I have reviewed the triage vital signs, the nursing notes, and pertinent available records from the EMR.  Listed above are laboratory and imaging tests that I personally ordered, reviewed, and interpreted and then considered in my medical decision making (see below for details).  Clinically suspicious for fecal impaction, had a recent disimpaction attempt 2 days ago, symptoms worsening, will reattempt here in the emergency department.  Abdomen soft and nontender, normal vital signs.     Disimpaction minimally successful.  Given duration of symptoms CT obtained to r/o mass lesion causing the impaction, overall reassuring.  Great success with enema, patient feeling all the way better, appropriate for dc.  Barth Kirks. Sedonia Small, Bertrand [email or web address]  Final Clinical Impressions(s) / ED Diagnoses     ICD-10-CM   1. Fecal impaction ([place])  K56.41       ED Discharge Orders          Ordered    bisacodyl (BISACODYL LAXATIVE) 10 MG suppository  As needed        04/07/21 0439             Discharge Instructions Discussed with and Provided to Patient:     Discharge Instructions      You were evaluated in the Emergency Department and after careful evaluation, we did not find any emergent condition requiring admission or further testing in the hospital.  Your exam/testing today was overall reassuring.  Symptoms likely due to fecal impaction.  Use  metamucil and miralax as we discussed.  Used suppository as needed.  Please return to the Emergency Department if you experience any worsening of your condition.  Thank you for allowing [name] to be a part of your care.         Maudie Flakes, MD 04/07/21 [phone]

## 2021-04-07 NOTE — ED Triage Notes (Signed)
Pt c/o constipation for 7 days. Pt states he has not had a "complete bowel movement".  Pt has been taking miralax  for 3 days with no relief.

## 2021-04-07 NOTE — ED Notes (Signed)
Patient transported to CT 

## 2021-04-08 ENCOUNTER — Telehealth: Payer: Self-pay | Admitting: Internal Medicine

## 2021-04-08 NOTE — Telephone Encounter (Signed)
Returned the call of the pt/spouse advised to do the Miralax purge as instructed by the ER (was seen there had xray and CT done-patient was impacted). Then to continue with the Miralax as needed. Pt was given instructions to add Metamucil by ER dr and suppositories. The Miralax instructed by Vicente Males 8 oz of water with the miralax as needed. The pt's spouse expressed understanding of this

## 2021-04-08 NOTE — Telephone Encounter (Signed)
Patient wife called asking how long patient was supposed to take mirilax

## 2021-04-13 ENCOUNTER — Other Ambulatory Visit: Payer: Self-pay | Admitting: Physician Assistant

## 2021-04-13 DIAGNOSIS — I10 Essential (primary) hypertension: Secondary | ICD-10-CM

## 2021-04-13 DIAGNOSIS — I2510 Atherosclerotic heart disease of native coronary artery without angina pectoris: Secondary | ICD-10-CM

## 2021-04-13 DIAGNOSIS — E785 Hyperlipidemia, unspecified: Secondary | ICD-10-CM

## 2021-04-13 DIAGNOSIS — Z125 Encounter for screening for malignant neoplasm of prostate: Secondary | ICD-10-CM

## 2021-04-13 DIAGNOSIS — E118 Type 2 diabetes mellitus with unspecified complications: Secondary | ICD-10-CM

## 2021-04-15 ENCOUNTER — Telehealth: Payer: Self-pay

## 2021-04-15 NOTE — Telephone Encounter (Signed)
Message left in my vm from [place] to give her a call regarding the pt . Phoned the pt/wife back and was advised he is having regular bowel movements 1 to 2 a day for the past week. Wondering if ok to stop that and start Metamucil. I advised them according to last note from Sept. 23 he can do the Miralax as needed but continue to do metamucil daily with suppositories. They expressed understanding of this.

## 2021-04-18 ENCOUNTER — Emergency Department (HOSPITAL_COMMUNITY)
Admission: EM | Admit: 2021-04-18 | Discharge: 2021-04-19 | Disposition: A | Discharge status: Home / Self Care | Payer: Self-pay | Attending: Emergency Medicine | Admitting: Emergency Medicine

## 2021-04-18 ENCOUNTER — Emergency Department (HOSPITAL_COMMUNITY): Payer: Self-pay

## 2021-04-18 ENCOUNTER — Telehealth: Payer: Self-pay

## 2021-04-18 ENCOUNTER — Encounter (HOSPITAL_COMMUNITY): Payer: Self-pay | Admitting: Emergency Medicine

## 2021-04-18 ENCOUNTER — Other Ambulatory Visit: Payer: Self-pay

## 2021-04-18 DIAGNOSIS — J449 Chronic obstructive pulmonary disease, unspecified: Secondary | ICD-10-CM | POA: Insufficient documentation

## 2021-04-18 DIAGNOSIS — Z7901 Long term (current) use of anticoagulants: Secondary | ICD-10-CM | POA: Insufficient documentation

## 2021-04-18 DIAGNOSIS — R1013 Epigastric pain: Secondary | ICD-10-CM | POA: Insufficient documentation

## 2021-04-18 DIAGNOSIS — F17210 Nicotine dependence, cigarettes, uncomplicated: Secondary | ICD-10-CM | POA: Insufficient documentation

## 2021-04-18 DIAGNOSIS — Z79899 Other long term (current) drug therapy: Secondary | ICD-10-CM | POA: Insufficient documentation

## 2021-04-18 DIAGNOSIS — E785 Hyperlipidemia, unspecified: Secondary | ICD-10-CM | POA: Insufficient documentation

## 2021-04-18 DIAGNOSIS — I2510 Atherosclerotic heart disease of native coronary artery without angina pectoris: Secondary | ICD-10-CM | POA: Insufficient documentation

## 2021-04-18 DIAGNOSIS — J45909 Unspecified asthma, uncomplicated: Secondary | ICD-10-CM | POA: Insufficient documentation

## 2021-04-18 DIAGNOSIS — E1169 Type 2 diabetes mellitus with other specified complication: Secondary | ICD-10-CM | POA: Insufficient documentation

## 2021-04-18 DIAGNOSIS — I480 Paroxysmal atrial fibrillation: Secondary | ICD-10-CM | POA: Insufficient documentation

## 2021-04-18 DIAGNOSIS — I10 Essential (primary) hypertension: Secondary | ICD-10-CM | POA: Insufficient documentation

## 2021-04-18 NOTE — Telephone Encounter (Signed)
The best things to do is separate Metamucil from any other drugs but at least 2 hours. Metamucil could affect absorption. So, he just needs to take oral meds 2 hours after Metamucil. Needs to find best way with how he is taking his meds.  FYI: we didn't prescribe Metamucil. He can do this or Benefiber. Keep Miralax daily as needed.

## 2021-04-18 NOTE — ED Provider Notes (Signed)
[facility] EMERGENCY DEPARTMENT Provider Note   CSN: [id number] Arrival date & time: 04/18/21  2204     History Chief Complaint  Patient presents with   Chest Pain    Richard [name] is a 55 y.o. male.  HPI     This a 55 year old male with history of coronary artery disease, COPD, hypertension, atrial fibrillation previously on Tikosyn, diabetes who presents with chest discomfort.  Patient reports that "I feel like my heart was going to burst" earlier this evening.  He took a nitroglycerin with relief.  Currently no pain.  He reports that he recently has had frequent episodes of atrial fibrillation.  At times it seems to be worse when he eats.  He reports that he is due to have an endoscopy.  He also has follow-up with his cardiologist to discuss whether he needs to start a medication such as diltiazem given that he is no longer taking Tikosyn. He did have some associated shortness of breath and diaphoresis.  Also reports had a productive cough yesterday.  No fevers.  He is an ongoing smoker. Past Medical History:  Diagnosis Date   Arthritis    Asthma    CAD (coronary artery disease)    a. Cardiac cath 07/2015 showed 65% distal Cx, 20% mid-distal LAD, 20% prox-distal RCA, EF 60%, EDP 70mmHg.   Colitis 1990   COPD (chronic obstructive pulmonary disease) ([place])    Depression    Essential hypertension    Gastric ulcer 2003; 2012   2003: + esophagitis; negative H.pylori serology  2012: Dr. Oneida Alar, mild gastritis, Bravo PH probe placement, negative H.pylori   GERD (gastroesophageal reflux disease)    Hepatic steatosis    History of hiatal hernia    Hyperlipemia    Overweight    Panic attacks    Paroxysmal atrial fibrillation ([place])    Stroke ([place])    TIA (transient ischemic attack)    Type II diabetes mellitus [facility])     Patient Active Problem List   Diagnosis Date Noted   Paroxysmal atrial fibrillation (HCC)    Lateral epicondylitis, left elbow 03/27/2019   Globus sensation  02/21/2018   Other cervical disc degeneration, unspecified cervical region 01/11/2018   Tendinitis of right triceps 01/11/2018   Neck pain 12/27/2017   Lateral epicondylitis, right elbow 05/10/2017   Constipation 12/24/2015   Dyspnea 11/17/2015   Coronary artery disease due to lipid rich plaque    Heart palpitations 08/12/2015   TIA (transient ischemic attack) 08/12/2015   Colon cancer screening 08/02/2015   Abdominal pain 12/18/2014   Encounter for screening colonoscopy 12/18/2014   Unspecified vitamin D deficiency 08/20/2012   Arteriosclerotic cardiovascular disease (ASCVD) 04/11/2012   Chronic low back pain    Essential hypertension    Anxiety and depression    Controlled diabetes mellitus type 2 with complications ([place]) 26/94/8546   Cigarette smoker 09/14/2010   CHRONIC OBSTRUCTIVE PULMONARY DISEASE 09/14/2010   HLD (hyperlipidemia) 11/25/2009   GERD (gastroesophageal reflux disease) 04/05/2009   Hepatic steatosis 04/05/2009    Past Surgical History:  Procedure Laterality Date   BRAVO [place] STUDY  05/03/2011   EVO:JJKK gastritis/normal esophagus and duodenum   CARDIAC CATHETERIZATION  1990s X 1; 2005; 08/12/2015   CARDIAC CATHETERIZATION N/A 08/12/2015   Procedure: Left Heart Cath and Coronary Angiography;  Surgeon: Belva Crome, MD; LAD 20%, CFX 65%, RCA 20%, EF 60%    COLONOSCOPY  1990   COLONOSCOPY WITH PROPOFOL N/A 11/21/2016   Dr. Oneida Alar: non-thrombosed  external hemorrhoids, one 6 mm polyp (polypoid lesion), internal hemorrhoids. TI Normal. 10 years screening   ESOPHAGOGASTRODUODENOSCOPY  05/03/2011   ENI:DPOE gastritis   NECK MASS EXCISION Right    "done in dr's office; behind right ear/side of ncek"   POLYPECTOMY  11/21/2016   Procedure: POLYPECTOMY;  Surgeon: [name], MD;  Location: [facility] ENDO [facility]ITE;  Service: Endoscopy;;  descending colon polyp   SHOULDER ARTHROSCOPY W/ ROTATOR CUFF REPAIR Right 2006   acromioclavicular joint arthrosis       Family History   Problem Relation Age of Onset   Lung cancer Mother    Alcohol abuse Mother    Heart attack Father 73   Diabetes Father    Alcohol abuse Father    Hypertension Brother    Hypertension Brother    Anxiety disorder Sister    Depression Sister    Anxiety disorder Sister    Heart attack Brother 94   Diabetes Brother    Hypertension Brother    Seizures Brother    Dementia Paternal Uncle    Dementia Cousin    ADD / ADHD Daughter    Colon cancer Neg Hx    Drug abuse Neg Hx    Bipolar disorder Neg Hx    OCD Neg Hx    Paranoid behavior Neg Hx    Schizophrenia Neg Hx    Sexual abuse Neg Hx    Physical abuse Neg Hx     Social History   Tobacco Use   Smoking status: Every Day    Packs/day: 0.25    Years: 25.00    Pack years: 6.25    Types: Cigarettes    Start date: 07/17/1982   Smokeless tobacco: Never  Vaping Use   Vaping Use: Never used  Substance Use Topics   Alcohol use: No    Alcohol/week: 0.0 standard drinks   Drug use: No    Home Medications Prior to Admission medications   Medication Sig Start Date End Date Taking? Authorizing Provider  ALPRAZolam Duanne Moron) 0.5 MG tablet Take one tablet by mouth three times a day and two tablets at night Patient taking differently: Take 0.5-1 mg by mouth See admin instructions. Take one tablet by mouth three times a day and two tablets at night 11/24/20   Cloria Spring, MD  aluminum-magnesium hydroxide 200-200 MG/5ML suspension Take by mouth every 6 (six) hours as needed for indigestion.    [provider]  amLODipine (NORVASC) 10 MG tablet Take 1 tablet (10 mg total) by mouth daily. 03/23/21   Satira Sark, MD  bisacodyl (BISACODYL LAXATIVE) 10 MG suppository Place 1 suppository (10 mg total) rectally as needed for moderate constipation. 04/07/21   Maudie Flakes, MD  budesonide-formoterol (SYMBICORT) 80-4.5 MCG/ACT inhaler Inhale 2 puffs into the lungs 2 (two) times daily as needed.    [provider]   clindamycin (CLEOCIN) 75 MG/5ML solution Take 300 mg by mouth 2 (two) times daily.    [provider]  cyclobenzaprine (FLEXERIL) 10 MG tablet Take 1 tablet (10 mg total) by mouth as needed for muscle spasms. 10/01/20   Johnson, Clanford L, MD  esomeprazole (NEXIUM) 40 MG capsule Take 1 capsule (40 mg total) by mouth 2 (two) times daily before a meal. Patient taking differently: Take 40 mg by mouth daily. 06/03/20   Annitta Needs, NP  hydrocortisone (ANUSOL-HC) 2.5 % rectal cream Place 1 application rectally 2 (two) times daily. 04/04/21   Erenest Rasher, PA-C  JANUVIA 100 MG tablet TAKE 1 Tablet BY MOUTH ONCE DAILY Patient taking differently: Take 100 mg by mouth daily. 01/24/21   [name], PA-C  lidocaine (XYLOCAINE) 2 % solution Use as directed 15 mLs in the mouth or throat every 3 (three) hours as needed for mouth pain (gargle swish and spit  as needed for dental pain). 01/22/21   Scot Jun, FNP  metoprolol succinate (TOPROL-XL) 50 MG 24 hr tablet Take 1 tablet (50 mg total) by mouth daily. 10/01/20   Johnson, Clanford L, MD  nitroGLYCERIN (NITROSTAT) 0.4 MG SL tablet Place 1 tablet (0.4 mg total) under the tongue every 5 (five) minutes x 3 doses as needed for chest pain (if no relief after 3rd dose, proceed to the ED for an evaluation). 02/02/20   Allred, Jeneen Rinks, MD  polyethylene glycol (MIRALAX / GLYCOLAX) 17 g packet Take 17 g by mouth daily as needed. 04/05/21   Davonna Belling, MD  PROVENTIL HFA 108 [phone] Base) MCG/ACT inhaler INHALE 2 PUFFS BY MOUTH EVERY 6 HOURS AS NEEDED FOR COUGHING, WHEEZING, OR SHORTNESS OF BREATH Patient taking differently: Inhale 2 puffs into the lungs every 6 (six) hours as needed for wheezing or shortness of breath. 01/24/21   [name], PA-C  simvastatin (ZOCOR) 20 MG tablet TAKE 1 TABLET BY MOUTH AT BEDTIME 03/28/21   [name], PA-C  sulfamethoxazole-trimethoprim (BACTRIM DS) 800-160 MG tablet Take 1 tablet by mouth 2 (two) times  daily. 01/22/21   Scot Jun, FNP  trolamine salicylate (ASPERCREME) 10 % cream Apply 1 application topically 2 (two) times daily as needed for muscle pain.    [provider]  XARELTO 20 MG TABS tablet TAKE 1 Tablet BY MOUTH ONCE EVERY DAY WITH SUPPER Patient taking differently: Take 20 mg by mouth daily with supper. 01/19/21   Satira Sark, MD    Allergies    Dexilant [dexlansoprazole], Mushroom ext cmplx(shiitake-reishi-mait), Penicillins, and Doxycycline  Review of Systems   Review of Systems  Constitutional:  Negative for fever.  Respiratory:  Positive for cough and shortness of breath.   Cardiovascular:  Positive for chest pain and palpitations. Negative for leg swelling.  Gastrointestinal:  Negative for abdominal pain, nausea and vomiting.  All other systems reviewed and are negative.  Physical Exam Updated Vital Signs BP 122/80   Pulse (!) 58   Temp (!) 97.5 F (36.4 C) (Oral)   Resp 18   Ht 1.803 m (5\' 11" )   Wt 83.9 kg   SpO2 96%   BMI 25.80 kg/m   Physical Exam Vitals and nursing note reviewed.  Constitutional:      Appearance: He is well-developed. He is not ill-appearing.  HENT:     Head: Normocephalic and atraumatic.  Eyes:     Pupils: Pupils are equal, round, and reactive to light.  Cardiovascular:     Rate and Rhythm: Normal rate and regular rhythm.     Heart sounds: Normal heart sounds. No murmur heard. Pulmonary:     Effort: Pulmonary effort is normal. No respiratory distress.     Breath sounds: Normal breath sounds. No wheezing.  Abdominal:     General: Bowel sounds are normal.     Palpations: Abdomen is soft.     Tenderness: There is no abdominal tenderness. There is no rebound.  Musculoskeletal:     Cervical back: Neck supple.     Right lower leg: No tenderness. No edema.     Left lower leg: No tenderness. No  edema.  Lymphadenopathy:     Cervical: No cervical adenopathy.  Skin:    General: Skin is warm and dry.   Neurological:     Mental Status: He is alert and oriented to person, place, and time.  Psychiatric:        Mood and Affect: Mood is anxious.    ED Results / Procedures / Treatments   Labs (all labs ordered are listed, but only abnormal results are displayed) Labs Reviewed  COMPREHENSIVE METABOLIC PANEL - Abnormal; Notable for the following components:      Result Value   Potassium 3.3 (*)    Glucose, Bld 179 (*)    Calcium 8.8 (*)    All other components within normal limits  MAGNESIUM  CBC  LIPASE, BLOOD  TROPONIN I (HIGH SENSITIVITY)  TROPONIN I (HIGH SENSITIVITY)    EKG EKG Interpretation  Date/Time:  Monday April 18 2021 22:34:14 EDT Ventricular Rate:  112 PR Interval:    QRS Duration: 97 QT Interval:  323 QTC Calculation: 441 R Axis:   78 Text Interpretation: Atrial fibrillation to NSR Repol abnrm, severe global ischemia (LM/MVD) Confirmed by Thayer Jew [phone]) on 04/19/2021 1:15:00 AM  Radiology DG Chest Port 1 View  Result Date: 04/18/2021 CLINICAL DATA:  Chest pain short of breath EXAM: PORTABLE CHEST 1 VIEW COMPARISON:  03/28/2019 FINDINGS: The heart size and mediastinal contours are within normal limits. Both lungs are clear. The visualized skeletal structures are unremarkable. IMPRESSION: No active disease. Electronically Signed   By: Donavan Foil M.D.   On: 04/18/2021 23:57    Procedures Procedures   Medications Ordered in ED Medications - No data to display  ED Course  I have reviewed the triage vital signs and the nursing notes.  Pertinent labs & imaging results that were available during my care of the patient were reviewed by me and considered in my medical decision making (see chart for details).  Clinical Course as of 04/19/21 0336  Tue Apr 19, 2021  0305 Advised patient of his work-up and reassuring cardiac testing.  He has been on the monitor.  He has been noted to be going in and out of atrial fibrillation; however, he spontaneously  converts.  Suspect that his symptoms may be related.  Recommend follow-up with cardiology for recommendations regarding rate controlling medication.  Previously failed Tikosyn.  Additionally he reports persistent "lump in my throat."  He is able to tolerate fluids.  Wife reports they are being worked up as an outpatient with EGD.  Recommend he continue work-up. [CH]    Clinical Course User Index [CH] Morgyn Marut, Barbette Hair, MD   MDM Rules/Calculators/A&P                           Patient presents with chest and epigastric discomfort.  He is overall nontoxic and vital signs are reassuring.  He is noted to go in and out of atrial fibrillation.  Some of his symptoms are consistent with his paroxysmal atrial fibrillation although some suggest GI etiology.  For this reason, dual work-up was initiated including cardiac enzymes and abdominal lab work.  EKG shows no evidence of acute ischemia.  He was noted to be going in and out of atrial fibrillation on the cardiac monitor.  Troponin x2 negative.  Doubt primary ACS as his symptoms are fairly atypical.  LFTs and lipase are reassuring.  Chest x-ray shows no evidence of pneumothorax or pneumonia.  On recheck, patient  is hemodynamically stable.  Discussed results.  He reports a persistent "lump in my throat."  He was able to tolerate fluids.  Recommend follow-up with both gastroenterology and cardiology as an outpatient.  After history, exam, and medical workup I feel the patient has been appropriately medically screened and is safe for discharge home. Pertinent diagnoses were discussed with the patient. Patient was given return precautions.  Final Clinical Impression(s) / ED Diagnoses Final diagnoses:  Paroxysmal atrial fibrillation (HCC)  Epigastric pain    Rx / DC Orders ED Discharge Orders          Ordered    Amb referral to AFIB Clinic        04/18/21 2334             Merryl Hacker, MD 04/19/21 [phone]

## 2021-04-18 NOTE — Telephone Encounter (Signed)
Noted  Phoned the pt back/wife advised of the metamucil instructions with other medications (when taking them)and also the pt can do Benefiber. Also to keep daily Miralax as needed. She (pt's wife) advised me that the pharmacist had advised the same thing. She expressed understanding.

## 2021-04-18 NOTE — Telephone Encounter (Signed)
Pt's wife phoned with regards to the pt. He is suppose to take Metamucil but the ingredients in the sugar-free interacts with statin drugs according to the back of the bottle. She was advised by me to call the pharmacist and I will put back a note to the doctor. Please advise

## 2021-04-18 NOTE — ED Triage Notes (Addendum)
Pt c/o chest pain x 2hours and took one nitro at home with relief. Pt states his heart was beating hard until he took nitro.

## 2021-04-18 NOTE — Congregational Nurse Program (Signed)
Pt/wife contacted  [facility]/Clara Gunn to obtain assistance with OTC medication (metamucil) due to no funds.     PL[facility] was contacted successfully to assist patient in getting medication  It was confirmed with pt with details and time to pick up medication on today.  Pt understood and called ended.

## 2021-04-19 ENCOUNTER — Telehealth: Payer: Self-pay | Admitting: Cardiology

## 2021-04-19 ENCOUNTER — Telehealth: Payer: Self-pay | Admitting: Internal Medicine

## 2021-04-19 LAB — COMPREHENSIVE METABOLIC PANEL
ALT: 17 U/L (ref 0–44)
AST: 16 U/L (ref 15–41)
Albumin: 4.1 g/dL (ref 3.5–5.0)
Alkaline Phosphatase: 47 U/L (ref 38–126)
Anion gap: 10 (ref 5–15)
BUN: 16 mg/dL (ref 6–20)
CO2: 25 mmol/L (ref 22–32)
Calcium: 8.8 mg/dL — ABNORMAL LOW (ref 8.9–10.3)
Chloride: 101 mmol/L (ref 98–111)
Creatinine, Ser: 0.99 mg/dL (ref 0.61–1.24)
GFR, Estimated: >60 mL/min (ref >60)
Glucose, Bld: 179 mg/dL — ABNORMAL HIGH (ref 70–99)
Potassium: 3.3 mmol/L — ABNORMAL LOW (ref 3.5–5.1)
Sodium: 136 mmol/L (ref 135–145)
Total Bilirubin: 0.5 mg/dL (ref 0.3–1.2)
Total Protein: 6.9 g/dL (ref 6.5–8.1)

## 2021-04-19 LAB — MAGNESIUM: Magnesium: 1.9 mg/dL (ref 1.7–2.4)

## 2021-04-19 LAB — CBC
HCT: 42.1 % (ref 39.0–52.0)
Hemoglobin: 14.7 g/dL (ref 13.0–17.0)
MCH: 31.8 pg (ref 26.0–34.0)
MCHC: 34.9 g/dL (ref 30.0–36.0)
MCV: 91.1 fL (ref 80.0–100.0)
Platelets: 181 10*3/uL (ref 150–400)
RBC: 4.62 MIL/uL (ref 4.22–5.81)
RDW: 12.5 % (ref 11.5–15.5)
WBC: 5.6 10*3/uL (ref 4.0–10.5)
nRBC: 0 % (ref 0.0–0.2)

## 2021-04-19 LAB — TROPONIN I (HIGH SENSITIVITY)
Troponin I (High Sensitivity): <2 ng/L (ref <18)
Troponin I (High Sensitivity): 3 ng/L (ref <18)

## 2021-04-19 LAB — LIPASE, BLOOD: Lipase: 23 U/L (ref 11–51)

## 2021-04-19 NOTE — Telephone Encounter (Signed)
Sent to [facility] [facility].

## 2021-04-19 NOTE — Discharge Instructions (Addendum)
You were seen today for abnormal sensation in your chest and a knot in her throat.  You seem to be going in and out of atrial fibrillation which could be the cause.  Your heart testing is otherwise reassuring.  Your abdominal labs are also reassuring.  Feel it is reasonable that you pursue endoscopy as an outpatient as previously recommended for further evaluation of possible gastritis.  Follow-up with your cardiologist regarding medications for your atrial fibrillation.

## 2021-04-19 NOTE — Telephone Encounter (Signed)
Scheduled in [facility]-Fib Clinic on 04/27/21 already No availability in [place] or [place] before 10/12

## 2021-04-19 NOTE — Telephone Encounter (Signed)
Patient informed and verbalized understanding of plan. Aware that pre-op clearance will be addressed after visit in A-Fib Clinic.

## 2021-04-19 NOTE — Telephone Encounter (Signed)
LMOVM for pt to call back. Pt not seen since 6/7.

## 2021-04-19 NOTE — Telephone Encounter (Signed)
Patient wife called asking if patient could be scheduled for an endo,. He was in the hospital this past weekend.  Told his wife he may have to wait until his next office visit

## 2021-04-19 NOTE — Telephone Encounter (Signed)
New Message    Patient was in hospital last night for AFIB and he is to have dental surgery to have several teeth extracted.  AFIB clinic recommended he speak with Dr Domenic Polite about proceeding with dental extractions?

## 2021-04-19 NOTE — Telephone Encounter (Signed)
Primary Cardiologist:Samuel Domenic Polite, MD  Chart reviewed as part of pre-operative protocol coverage. Because of Richard [name]'s past medical history and time since last visit, he/she will require a follow-up visit in order to better assess preoperative cardiovascular risk.  Pre-op covering staff: - Please schedule appointment and call patient to inform them. - Please contact requesting surgeon's office via preferred method (i.e, phone, fax) to inform them of need for appointment prior to surgery.  If applicable, this message will also be routed to pharmacy pool and/or primary cardiologist for input on holding anticoagulant/antiplatelet agent as requested below so that this information is available at time of patient's appointment.   Deberah Pelton, NP  04/19/2021, 12:44 PM

## 2021-04-20 ENCOUNTER — Telehealth: Payer: Self-pay | Admitting: *Deleted

## 2021-04-20 ENCOUNTER — Telehealth: Payer: Self-pay

## 2021-04-20 MED ORDER — NITROGLYCERIN 0.4 MG SL SUBL: 0.4000 mg | SUBLINGUAL_TABLET | SUBLINGUAL | 3 refills | Status: DC | PRN

## 2021-04-20 NOTE — Telephone Encounter (Signed)
Received a call from [facility] stating that pt had an episode of Afib on 9/16. Heart rate was 112 bpm and lasted 1 min 42 secs. Pt has since been seen in the ED and is scheduled with the A-Fib clinic on 04-27-21. Please advise.

## 2021-04-20 NOTE — Telephone Encounter (Signed)
Medication refill request for Nitro 0.4 mg SL tablets approved and sent to [facility].

## 2021-04-20 NOTE — Telephone Encounter (Signed)
Called to follow up regarding assistance with Metamucil that was prescribed from ED visit. Voucher had been sent to [employer] from [employer] for a one time assistance . Wife Richard [name] states she has not picked it up yet, plans on doing that today. States husband Richard [name] was into ER on 04/18/21 with atrial fib. He will be following up with Cardiologist soon.  They are aware of follow ups with [facility]. Client's DM type 2 last A1C 6.2 01/21/21.   Encouraged client and wife to call [facility] for any further needs.  Mayra Reel RN [facility] [employer]

## 2021-04-20 NOTE — Telephone Encounter (Signed)
Wife (DPR) notified and informed to keep upcomming appt.

## 2021-04-25 ENCOUNTER — Other Ambulatory Visit (HOSPITAL_COMMUNITY)
Admission: RE | Admit: 2021-04-25 | Discharge: 2021-04-25 | Disposition: A | Discharge status: Home / Self Care | Payer: Self-pay | Source: Ambulatory Visit | Attending: Physician Assistant | Admitting: Physician Assistant

## 2021-04-25 DIAGNOSIS — Z125 Encounter for screening for malignant neoplasm of prostate: Secondary | ICD-10-CM | POA: Insufficient documentation

## 2021-04-25 DIAGNOSIS — E785 Hyperlipidemia, unspecified: Secondary | ICD-10-CM | POA: Insufficient documentation

## 2021-04-25 DIAGNOSIS — E118 Type 2 diabetes mellitus with unspecified complications: Secondary | ICD-10-CM | POA: Insufficient documentation

## 2021-04-25 DIAGNOSIS — I10 Essential (primary) hypertension: Secondary | ICD-10-CM | POA: Insufficient documentation

## 2021-04-25 DIAGNOSIS — I2510 Atherosclerotic heart disease of native coronary artery without angina pectoris: Secondary | ICD-10-CM | POA: Insufficient documentation

## 2021-04-25 LAB — COMPREHENSIVE METABOLIC PANEL
ALT: 19 U/L (ref 0–44)
AST: 13 U/L — ABNORMAL LOW (ref 15–41)
Albumin: 4.4 g/dL (ref 3.5–5.0)
Alkaline Phosphatase: 49 U/L (ref 38–126)
Anion gap: 7 (ref 5–15)
BUN: 18 mg/dL (ref 6–20)
CO2: 29 mmol/L (ref 22–32)
Calcium: 9.3 mg/dL (ref 8.9–10.3)
Chloride: 103 mmol/L (ref 98–111)
Creatinine, Ser: 0.97 mg/dL (ref 0.61–1.24)
GFR, Estimated: >60 mL/min (ref >60)
Glucose, Bld: 125 mg/dL — ABNORMAL HIGH (ref 70–99)
Potassium: 4 mmol/L (ref 3.5–5.1)
Sodium: 139 mmol/L (ref 135–145)
Total Bilirubin: 0.7 mg/dL (ref 0.3–1.2)
Total Protein: 7.4 g/dL (ref 6.5–8.1)

## 2021-04-25 LAB — LIPID PANEL
Cholesterol: 109 mg/dL (ref 0–200)
HDL: 30 mg/dL — ABNORMAL LOW (ref >40)
LDL Cholesterol: 59 mg/dL (ref 0–99)
Total CHOL/HDL Ratio: 3.6 RATIO
Triglycerides: 100 mg/dL (ref <150)
VLDL: 20 mg/dL (ref 0–40)

## 2021-04-25 LAB — HEMOGLOBIN A1C
Hgb A1c MFr Bld: 6.2 % — ABNORMAL HIGH (ref 4.8–5.6)
Mean Plasma Glucose: 131.24 mg/dL

## 2021-04-25 LAB — PSA: Prostatic Specific Antigen: 0.5 ng/mL (ref 0.00–4.00)

## 2021-04-26 ENCOUNTER — Encounter: Payer: Self-pay | Admitting: Physician Assistant

## 2021-04-26 ENCOUNTER — Ambulatory Visit: Payer: Self-pay | Admitting: Physician Assistant

## 2021-04-26 VITALS — BP 116/75 | HR 60 | Temp 97.7°F | Wt 181.0 lb

## 2021-04-26 DIAGNOSIS — E118 Type 2 diabetes mellitus with unspecified complications: Secondary | ICD-10-CM

## 2021-04-26 DIAGNOSIS — I10 Essential (primary) hypertension: Secondary | ICD-10-CM

## 2021-04-26 DIAGNOSIS — E785 Hyperlipidemia, unspecified: Secondary | ICD-10-CM

## 2021-04-26 DIAGNOSIS — K219 Gastro-esophageal reflux disease without esophagitis: Secondary | ICD-10-CM

## 2021-04-26 DIAGNOSIS — I480 Paroxysmal atrial fibrillation: Secondary | ICD-10-CM

## 2021-04-26 DIAGNOSIS — F17200 Nicotine dependence, unspecified, uncomplicated: Secondary | ICD-10-CM

## 2021-04-26 DIAGNOSIS — I2510 Atherosclerotic heart disease of native coronary artery without angina pectoris: Secondary | ICD-10-CM

## 2021-04-26 DIAGNOSIS — J449 Chronic obstructive pulmonary disease, unspecified: Secondary | ICD-10-CM

## 2021-04-26 NOTE — Progress Notes (Signed)
BP 116/75   Pulse 60   Temp 97.7 F (36.5 C)   Wt 181 lb (82.1 kg)   SpO2 97%   BMI 25.24 kg/m    Subjective:    Patient ID: Richard [name], male    DOB: 06-26-1966, 55 y.o.   MRN: [id number]  HPI: Richard [name] is a 55 y.o. male presenting on 04/26/2021 for Diabetes   HPI    Pt had a negative covid 19 screening questionnaire.   Pt continues to see GI for gerd   Pt continues to see cardiology for CAD and PAF.  He has appt with afib clinic tomorrow.     Pt continues to see [facility] specialist for panic disorder.    He has Not gotten the covid vaccination.  He has been seen in the past month in the ER for his atrial fibrillation and for fecal impaction.       Relevant past medical, surgical, family and social history reviewed and updated as indicated. Interim medical history since our last visit reviewed. Allergies and medications reviewed and updated.    Current Outpatient Medications:    ALPRAZolam (XANAX) 0.5 MG tablet, Take one tablet by mouth three times a day and two tablets at night (Patient taking differently: Take 0.5-1 mg by mouth See admin instructions. Take one tablet by mouth three times a day and two tablets at night), Disp: 150 tablet, Rfl: 2   aluminum-magnesium hydroxide 200-200 MG/5ML suspension, Take by mouth every 6 (six) hours as needed for indigestion., Disp: , Rfl:    amLODipine (NORVASC) 10 MG tablet, Take 1 tablet (10 mg total) by mouth daily., Disp: 90 tablet, Rfl: 1   budesonide-formoterol (SYMBICORT) 80-4.5 MCG/ACT inhaler, Inhale 2 puffs into the lungs 2 (two) times daily as needed., Disp: , Rfl:    cyclobenzaprine (FLEXERIL) 10 MG tablet, Take 1 tablet (10 mg total) by mouth as needed for muscle spasms., Disp: , Rfl:    esomeprazole (NEXIUM) 40 MG capsule, Take 1 capsule (40 mg total) by mouth 2 (two) times daily before a meal. (Patient taking differently: Take 40 mg by mouth daily.), Disp: 60 capsule, Rfl: 5   hydrocortisone (ANUSOL-HC) 2.5 %  rectal cream, Place 1 application rectally 2 (two) times daily., Disp: 30 g, Rfl: 0   JANUVIA 100 MG tablet, TAKE 1 Tablet BY MOUTH ONCE DAILY (Patient taking differently: Take 100 mg by mouth daily.), Disp: 90 tablet, Rfl: 1   metoprolol succinate (TOPROL-XL) 50 MG 24 hr tablet, Take 1 tablet (50 mg total) by mouth daily., Disp: 135 tablet, Rfl: 3   nitroGLYCERIN (NITROSTAT) 0.4 MG SL tablet, Place 1 tablet (0.4 mg total) under the tongue every 5 (five) minutes x 3 doses as needed for chest pain (if no relief after 3rd dose, proceed to the ED for an evaluation)., Disp: 25 tablet, Rfl: 3   polyethylene glycol (MIRALAX / GLYCOLAX) 17 g packet, Take 17 g by mouth daily as needed., Disp: 14 each, Rfl: 0   PROVENTIL HFA 108 (90 Base) MCG/ACT inhaler, INHALE 2 PUFFS BY MOUTH EVERY 6 HOURS AS NEEDED FOR COUGHING, WHEEZING, OR SHORTNESS OF BREATH (Patient taking differently: Inhale 2 puffs into the lungs every 6 (six) hours as needed for wheezing or shortness of breath.), Disp: 13.4 g, Rfl: 1   simvastatin (ZOCOR) 20 MG tablet, TAKE 1 TABLET BY MOUTH AT BEDTIME, Disp: 30 tablet, Rfl: 2   trolamine salicylate (ASPERCREME) 10 % cream, Apply 1 application topically 2 (two) times  daily as needed for muscle pain., Disp: , Rfl:    XARELTO 20 MG TABS tablet, TAKE 1 Tablet BY MOUTH ONCE EVERY DAY WITH SUPPER (Patient taking differently: Take 20 mg by mouth daily with supper.), Disp: 90 tablet, Rfl: 1   bisacodyl (BISACODYL LAXATIVE) 10 MG suppository, Place 1 suppository (10 mg total) rectally as needed for moderate constipation. (Patient not taking: Reported on 04/26/2021), Disp: 12 suppository, Rfl: 0   clindamycin (CLEOCIN) 75 MG/5ML solution, Take 300 mg by mouth 2 (two) times daily. (Patient not taking: Reported on 04/26/2021), Disp: , Rfl:    lidocaine (XYLOCAINE) 2 % solution, Use as directed 15 mLs in the mouth or throat every 3 (three) hours as needed for mouth pain (gargle swish and spit  as needed for dental  pain). (Patient not taking: Reported on 04/26/2021), Disp: 100 mL, Rfl: 0   sulfamethoxazole-trimethoprim (BACTRIM DS) 800-160 MG tablet, Take 1 tablet by mouth 2 (two) times daily. (Patient not taking: Reported on 04/26/2021), Disp: 20 tablet, Rfl: 0     Review of Systems  Per HPI unless specifically indicated above     Objective:    BP 116/75   Pulse 60   Temp 97.7 F (36.5 C)   Wt 181 lb (82.1 kg)   SpO2 97%   BMI 25.24 kg/m   Wt Readings from Last 3 Encounters:  04/26/21 181 lb (82.1 kg)  04/18/21 185 lb (83.9 kg)  04/07/21 187 lb 6.3 oz (85 kg)    Physical Exam Vitals reviewed.  Constitutional:      General: He is not in acute distress.    Appearance: He is well-developed. He is not ill-appearing.  HENT:     Head: Normocephalic and atraumatic.  Cardiovascular:     Rate and Rhythm: Normal rate and regular rhythm.  Pulmonary:     Effort: Pulmonary effort is normal.     Breath sounds: Normal breath sounds. No wheezing.  Abdominal:     General: Bowel sounds are normal.     Palpations: Abdomen is soft.     Tenderness: There is no abdominal tenderness.  Musculoskeletal:     Cervical back: Neck supple.     Right lower leg: No edema.     Left lower leg: No edema.  Lymphadenopathy:     Cervical: No cervical adenopathy.  Skin:    General: Skin is warm and dry.  Neurological:     Mental Status: He is alert and oriented to person, place, and time.  Psychiatric:        Behavior: Behavior normal.    Results for orders placed or performed during the hospital encounter of 04/25/21  PSA  Result Value Ref Range   Prostatic Specific Antigen 0.50 0.00 - 4.00 ng/mL  Hemoglobin A1c  Result Value Ref Range   Hgb A1c MFr Bld 6.2 (H) 4.8 - 5.6 %   Mean Plasma Glucose 131.24 mg/dL  Comprehensive metabolic panel  Result Value Ref Range   Sodium 139 135 - 145 mmol/L   Potassium 4.0 3.5 - 5.1 mmol/L   Chloride 103 98 - 111 mmol/L   CO2 29 22 - 32 mmol/L   Glucose, Bld  125 (H) 70 - 99 mg/dL   BUN 18 6 - 20 mg/dL   Creatinine, Ser 0.97 0.61 - 1.24 mg/dL   Calcium 9.3 8.9 - 10.3 mg/dL   Total Protein 7.4 6.5 - 8.1 g/dL   Albumin 4.4 3.5 - 5.0 g/dL   AST 13 (L) 15 -  41 U/L   ALT 19 0 - 44 U/L   Alkaline Phosphatase 49 38 - 126 U/L   Total Bilirubin 0.7 0.3 - 1.2 mg/dL   GFR, Estimated >60 >60 mL/min   Anion gap 7 5 - 15  Lipid panel  Result Value Ref Range   Cholesterol 109 0 - 200 mg/dL   Triglycerides 100 <150 mg/dL   HDL 30 (L) >40 mg/dL   Total CHOL/HDL Ratio 3.6 RATIO   VLDL 20 0 - 40 mg/dL   LDL Cholesterol 59 0 - 99 mg/dL      Assessment & Plan:    Encounter Diagnoses  Name Primary?   Controlled diabetes mellitus type 2 with complications, unspecified whether long term insulin use ([place]) Yes   Essential hypertension    Hyperlipidemia, unspecified hyperlipidemia type    Coronary artery disease involving native heart without angina pectoris, unspecified vessel or lesion type    Gastroesophageal reflux disease, unspecified whether esophagitis present    Paroxysmal atrial fibrillation (HCC)    Tobacco use disorder    Chronic obstructive pulmonary disease, unspecified COPD type ([place])      -reviewed labs with pt -pt to continue current medications -He is working on updating his [employer] financial assistance -He got his flu shot -He is still scared of the covid shot.  He was educated of the safety and effectiveness of the vaccination and encouraged to get it.  He was offered appointment for that on October 27 at this facility.  He says he will think about it -pt to continue with cardiology.  He was encouraged to get ablation some years ago but was afraid to do so.  He was encouraged to discuss this with cardiology again as it might be a more permanent solution to his recurrent episodes -he is encouraged to stop smoking -discussed gradual increase in exercise to help with his fatigue and deconditioning -pt to follow up here 3 1/2  months.  He is to contact office sooner prn

## 2021-04-26 NOTE — Patient Instructions (Signed)
Covid shot clinic - Thursday October 27

## 2021-04-27 ENCOUNTER — Other Ambulatory Visit: Payer: Self-pay

## 2021-04-27 ENCOUNTER — Encounter (HOSPITAL_COMMUNITY): Payer: Self-pay | Admitting: Physician Assistant

## 2021-04-27 ENCOUNTER — Ambulatory Visit (HOSPITAL_COMMUNITY)
Admission: RE | Admit: 2021-04-27 | Discharge: 2021-04-27 | Disposition: A | Discharge status: Home / Self Care | Payer: Self-pay | Source: Ambulatory Visit | Attending: Physician Assistant | Admitting: Physician Assistant

## 2021-04-27 VITALS — BP 104/64 | HR 54 | Ht 71.0 in | Wt 181.2 lb

## 2021-04-27 DIAGNOSIS — R0683 Snoring: Secondary | ICD-10-CM | POA: Insufficient documentation

## 2021-04-27 DIAGNOSIS — Z79899 Other long term (current) drug therapy: Secondary | ICD-10-CM | POA: Insufficient documentation

## 2021-04-27 DIAGNOSIS — Z881 Allergy status to other antibiotic agents status: Secondary | ICD-10-CM | POA: Insufficient documentation

## 2021-04-27 DIAGNOSIS — Z880 Allergy status to penicillin: Secondary | ICD-10-CM | POA: Insufficient documentation

## 2021-04-27 DIAGNOSIS — J449 Chronic obstructive pulmonary disease, unspecified: Secondary | ICD-10-CM | POA: Insufficient documentation

## 2021-04-27 DIAGNOSIS — Z7984 Long term (current) use of oral hypoglycemic drugs: Secondary | ICD-10-CM | POA: Insufficient documentation

## 2021-04-27 DIAGNOSIS — E785 Hyperlipidemia, unspecified: Secondary | ICD-10-CM | POA: Insufficient documentation

## 2021-04-27 DIAGNOSIS — E119 Type 2 diabetes mellitus without complications: Secondary | ICD-10-CM | POA: Insufficient documentation

## 2021-04-27 DIAGNOSIS — F17210 Nicotine dependence, cigarettes, uncomplicated: Secondary | ICD-10-CM | POA: Insufficient documentation

## 2021-04-27 DIAGNOSIS — I480 Paroxysmal atrial fibrillation: Secondary | ICD-10-CM | POA: Insufficient documentation

## 2021-04-27 DIAGNOSIS — I2510 Atherosclerotic heart disease of native coronary artery without angina pectoris: Secondary | ICD-10-CM | POA: Insufficient documentation

## 2021-04-27 DIAGNOSIS — I10 Essential (primary) hypertension: Secondary | ICD-10-CM | POA: Insufficient documentation

## 2021-04-27 DIAGNOSIS — Z560 Unemployment, unspecified: Secondary | ICD-10-CM | POA: Insufficient documentation

## 2021-04-27 DIAGNOSIS — Z888 Allergy status to other drugs, medicaments and biological substances status: Secondary | ICD-10-CM | POA: Insufficient documentation

## 2021-04-27 DIAGNOSIS — Z7901 Long term (current) use of anticoagulants: Secondary | ICD-10-CM | POA: Insufficient documentation

## 2021-04-27 DIAGNOSIS — R0681 Apnea, not elsewhere classified: Secondary | ICD-10-CM

## 2021-04-27 DIAGNOSIS — D6869 Other thrombophilia: Secondary | ICD-10-CM | POA: Insufficient documentation

## 2021-04-27 DIAGNOSIS — Z7951 Long term (current) use of inhaled steroids: Secondary | ICD-10-CM | POA: Insufficient documentation

## 2021-04-27 DIAGNOSIS — Z8673 Personal history of transient ischemic attack (TIA), and cerebral infarction without residual deficits: Secondary | ICD-10-CM | POA: Insufficient documentation

## 2021-04-27 DIAGNOSIS — F410 Panic disorder [episodic paroxysmal anxiety] without agoraphobia: Secondary | ICD-10-CM | POA: Insufficient documentation

## 2021-04-27 NOTE — Progress Notes (Addendum)
Primary Care Physician: [name], PA-C Primary Cardiologist: Dr Domenic Polite Primary Electrophysiologist: Dr Lovena Le (new) Referring Physician: [facility]   Richard [name] is a 55 y.o. male with a history of nonobstructive CAD, COPD, HTN, HLD, panic attacks, DM, TIA, atrial fibrillation who presents for consultation in the [facility], seen by Roderic Palau remotely. Patient is on Xarelto for a CHADS2VASC score of 5. Patient was previously on flecainide but felt this was making his heart racing worse and this was discontinued. He had done well from an afib standpoint until 04/18/21 when he felt "his heart was going to burst out of his chest." He did take a nitroglycerin. At the ED, ECG showed transient afib which converted to SR. He remains in SR today. He wore a cardiac monitor 03/2021 which showed <1% atrial flutter burden. He denies alcohol use but does admit to snoring and witnessed apnea.   Today, he denies symptoms of chest pain, shortness of breath, orthopnea, PND, lower extremity edema, dizziness, presyncope, syncope, bleeding, or neurologic sequela. The patient is tolerating medications without difficulties and is otherwise without complaint today.    Atrial Fibrillation Risk Factors:  he does have symptoms or diagnosis of sleep apnea. he is agreeable to sleep evaluation.  he does not have a history of rheumatic fever. he does have a history of alcohol use. The patient does not have a history of early familial atrial fibrillation or other arrhythmias.  he has a BMI of Body mass index is 25.27 kg/m.[name] Filed Weights   04/27/21 1056  Weight: 82.2 kg    Family History  Problem Relation Age of Onset   Lung cancer Mother    Alcohol abuse Mother    Heart attack Father 8   Diabetes Father    Alcohol abuse Father    Hypertension Brother    Hypertension Brother    Anxiety disorder Sister    Depression Sister    Anxiety disorder Sister    Heart  attack Brother 75   Diabetes Brother    Hypertension Brother    Seizures Brother    Dementia Paternal Uncle    Dementia Cousin    ADD / ADHD Daughter    Colon cancer Neg Hx    Drug abuse Neg Hx    Bipolar disorder Neg Hx    OCD Neg Hx    Paranoid behavior Neg Hx    Schizophrenia Neg Hx    Sexual abuse Neg Hx    Physical abuse Neg Hx      Atrial Fibrillation Management history:  Previous antiarrhythmic drugs: flecainide  Previous cardioversions: none Previous ablations: none CHADS2VASC score: 5 Anticoagulation history: Xarelto    Past Medical History:  Diagnosis Date   Arthritis    Asthma    CAD (coronary artery disease)    a. Cardiac cath 07/2015 showed 65% distal Cx, 20% mid-distal LAD, 20% prox-distal RCA, EF 60%, EDP 71mmHg.   Colitis 1990   COPD (chronic obstructive pulmonary disease) ([place])    Depression    Essential hypertension    Gastric ulcer 2003; 2012   2003: + esophagitis; negative H.pylori serology  2012: Dr. Oneida Alar, mild gastritis, Bravo PH probe placement, negative H.pylori   GERD (gastroesophageal reflux disease)    Hepatic steatosis    History of hiatal hernia    Hyperlipemia    Overweight    Panic attacks    Paroxysmal atrial fibrillation ([place])    Stroke ([place])    TIA (  transient ischemic attack)    Type II diabetes mellitus ([place])    Past Surgical History:  Procedure Laterality Date   BRAVO [facility] STUDY  05/03/2011   IFO:YDXA gastritis/normal esophagus and duodenum   CARDIAC CATHETERIZATION  1990s X 1; 2005; 08/12/2015   CARDIAC CATHETERIZATION N/A 08/12/2015   Procedure: Left Heart Cath and Coronary Angiography;  Surgeon: Belva Crome, MD; LAD 20%, CFX 65%, RCA 20%, EF 60%    COLONOSCOPY  1990   COLONOSCOPY WITH PROPOFOL N/A 11/21/2016   Dr. Oneida Alar: non-thrombosed external hemorrhoids, one 6 mm polyp (polypoid lesion), internal hemorrhoids. TI Normal. 10 years screening   ESOPHAGOGASTRODUODENOSCOPY  05/03/2011   JOI:NOMV gastritis   NECK MASS  EXCISION Right    "done in dr's office; behind right ear/side of ncek"   POLYPECTOMY  11/21/2016   Procedure: POLYPECTOMY;  Surgeon: [name], MD;  Location: [facility] END[facility] SUITE;  Service: Endoscopy;;  descending colon polyp   SHOULDER ARTHROSCOPY W/ ROTATOR CUFF REPAIR Right 2006   acromioclavicular joint arthrosis    Current Outpatient Medications  Medication Sig Dispense Refill   ALPRAZolam (XANAX) 0.5 MG tablet Take one tablet by mouth three times a day and two tablets at night (Patient taking differently: Take one tablet by mouth three times a day and two tablets at night) 150 tablet 2   aluminum-magnesium hydroxide 200-200 MG/5ML suspension Take by mouth every 6 (six) hours as needed for indigestion.     amLODipine (NORVASC) 10 MG tablet Take 1 tablet (10 mg total) by mouth daily. 90 tablet 1   bisacodyl (BISACODYL LAXATIVE) 10 MG suppository Place 1 suppository (10 mg total) rectally as needed for moderate constipation. 12 suppository 0   budesonide-formoterol (SYMBICORT) 80-4.5 MCG/ACT inhaler Inhale 2 puffs into the lungs 2 (two) times daily as needed.     cyclobenzaprine (FLEXERIL) 10 MG tablet Take 1 tablet (10 mg total) by mouth as needed for muscle spasms.     esomeprazole (NEXIUM) 40 MG capsule Take 1 capsule (40 mg total) by mouth 2 (two) times daily before a meal. 60 capsule 5   hydrocortisone (ANUSOL-HC) 2.5 % rectal cream Place 1 application rectally 2 (two) times daily. 30 g 0   JANUVIA 100 MG tablet TAKE 1 Tablet BY MOUTH ONCE DAILY 90 tablet 1   lidocaine (XYLOCAINE) 2 % solution Use as directed 15 mLs in the mouth or throat every 3 (three) hours as needed for mouth pain (gargle swish and spit  as needed for dental pain). 100 mL 0   metoprolol succinate (TOPROL-XL) 50 MG 24 hr tablet Take 1 tablet (50 mg total) by mouth daily. 135 tablet 3   nitroGLYCERIN (NITROSTAT) 0.4 MG SL tablet Place 1 tablet (0.4 mg total) under the tongue every 5 (five) minutes x 3 doses as needed for  chest pain (if no relief after 3rd dose, proceed to the ED for an evaluation). 25 tablet 3   polyethylene glycol (MIRALAX / GLYCOLAX) 17 g packet Take 17 g by mouth daily as needed. 14 each 0   PROVENTIL HFA 108 (90 Base) MCG/ACT inhaler INHALE 2 PUFFS BY MOUTH EVERY 6 HOURS AS NEEDED FOR COUGHING, WHEEZING, OR SHORTNESS OF BREATH 13.4 g 1   simvastatin (ZOCOR) 20 MG tablet TAKE 1 TABLET BY MOUTH AT BEDTIME 30 tablet 2   trolamine salicylate (ASPERCREME) 10 % cream Apply 1 application topically 2 (two) times daily as needed for muscle pain.     XARELTO 20 MG TABS tablet TAKE 1  Tablet BY MOUTH ONCE EVERY DAY WITH SUPPER 90 tablet 1   No current facility-administered medications for this encounter.    Allergies  Allergen Reactions   Dexilant [Dexlansoprazole] Anaphylaxis   Mushroom Ext Cmplx(Shiitake-Reishi-Mait) Anaphylaxis    Rapid heart rate.   Penicillins Anaphylaxis    Has patient had a PCN reaction causing immediate rash, facial/tongue/throat swelling, SOB or lightheadedness with hypotension: Yes Has patient had a PCN reaction causing severe rash involving mucus membranes or skin necrosis: No Has patient had a PCN reaction that required hospitalization Yes Has patient had a PCN reaction occurring within the last 10 years: No If all of the above answers are "NO", then may proceed with Cephalosporin use.    Doxycycline Nausea And Vomiting         Social History   Socioeconomic History   Marital status: Married    Spouse name: Not on file   Number of children: Not on file   Years of education: Not on file   Highest education level: Not on file  Occupational History   Occupation: full time    Employer: UNEMPLOYED  Tobacco Use   Smoking status: Every Day    Packs/day: 0.25    Years: 25.00    Pack years: 6.25    Types: Cigarettes    Start date: 07/17/1982   Smokeless tobacco: Never   Tobacco comments:    4-6 cigarettes daily 04/27/2021  Vaping Use   Vaping Use: Never used   Substance and Sexual Activity   Alcohol use: No    Alcohol/week: 0.0 standard drinks   Drug use: No   Sexual activity: Yes    Birth control/protection: None  Other Topics Concern   Not on file  Social History Narrative   Pt lives in [place] with wife.  5 children.  Unemployed due to panic attacks and back pain   Social Determinants of Health   Financial Resource Strain: Not on file  Food Insecurity: Not on file  Transportation Needs: Not on file  Physical Activity: Not on file  Stress: Not on file  Social Connections: Not on file  Intimate Partner Violence: Not on file     ROS- All systems are reviewed and negative except as per the HPI above.  Physical Exam: Vitals:   04/27/21 1056  BP: 104/64  Pulse: (!) 54  Weight: 82.2 kg  Height: 5\' 11"  (1.803 m)    GEN- The patient is a well appearing male, alert and oriented x 3 today.   Head- normocephalic, atraumatic Eyes-  Sclera clear, conjunctiva pink Ears- hearing intact Oropharynx- clear Neck- supple  Lungs- Clear to ausculation bilaterally, normal work of breathing Heart- Regular rate and rhythm, no murmurs, rubs or gallops  GI- soft, NT, ND, + BS Extremities- no clubbing, cyanosis, or edema MS- no significant deformity or atrophy Skin- no rash or lesion Psych- euthymic mood, full affect Neuro- strength and sensation are intact  Wt Readings from Last 3 Encounters:  04/27/21 82.2 kg  04/26/21 82.1 kg  04/18/21 83.9 kg    EKG today demonstrates  SB Vent. rate 54 BPM PR interval 178 ms QRS duration 98 ms QT/QTcB 414/392 ms  Echo 10/01/20 demonstrated   1. Left ventricular ejection fraction, by estimation, is 65 to 70%. The  left ventricle has normal function. The left ventricle has no regional  wall motion abnormalities. Left ventricular diastolic parameters were  normal.   2. Right ventricular systolic function is normal. The right ventricular  size  is normal. Tricuspid regurgitation signal is  inadequate for assessing  PA pressure.   3. The mitral valve is grossly normal. No evidence of mitral valve  regurgitation.   4. The aortic valve is tricuspid. Aortic valve regurgitation is not  visualized.   5. The inferior vena cava is normal in size with <50% respiratory  variability, suggesting right atrial pressure of 8 mmHg.   6. Agitated saline contrast bubble study was negative, with no evidence  of any interatrial shunt.   Epic records are reviewed at length today  CHA2DS2-VASc Score = 5  The patient's score is based upon: CHF History: 0 HTN History: 1 Diabetes History: 1 Stroke History: 2 Vascular Disease History: 1 Age Score: 0 Gender Score: 0       ASSESSMENT AND PLAN: 1. Paroxysmal Atrial Fibrillation/atrial flutter The patient's CHA2DS2-VASc score is 5, indicating a 7.2% annual risk of stroke.   ? Related to undiagnosed OSA. Recent monitor showed <1% burden. We discussed rhythm control options, he is averse to medications in general and wasn't interested in AAD. Could be considered for ablation although he is presently uninsured.  Continue Toprol 50 mg daily Continue Xarelto 20 mg daily  2. Secondary Hypercoagulable State (ICD10:  D68.69) The patient is at significant risk for stroke/thromboembolism based upon his CHA2DS2-VASc Score of 5.  Continue Rivaroxaban (Xarelto).   3. HTN Stable, no changes today.  4. Snoring/witnessed apnea The importance of adequate treatment of sleep apnea was discussed today in order to improve our ability to maintain sinus rhythm long term. Will refer for sleep study.    Follow up with Dr Lovena Le as scheduled.    [facility] [address] [place], [place] [address] [phone] 04/27/2021 1:36 PM

## 2021-05-02 ENCOUNTER — Ambulatory Visit: Payer: Self-pay | Admitting: Neurology

## 2021-05-10 ENCOUNTER — Ambulatory Visit (INDEPENDENT_AMBULATORY_CARE_PROVIDER_SITE_OTHER): Payer: Self-pay | Admitting: Internal Medicine

## 2021-05-10 ENCOUNTER — Telehealth: Payer: Self-pay | Admitting: *Deleted

## 2021-05-10 ENCOUNTER — Other Ambulatory Visit: Payer: Self-pay

## 2021-05-10 ENCOUNTER — Encounter: Payer: Self-pay | Admitting: Internal Medicine

## 2021-05-10 VITALS — BP 122/68 | HR 56 | Ht 71.0 in | Wt 181.0 lb

## 2021-05-10 DIAGNOSIS — I480 Paroxysmal atrial fibrillation: Secondary | ICD-10-CM

## 2021-05-10 MED ORDER — MULTAQ 400 MG PO TABS: 400.0000 mg | ORAL_TABLET | Freq: Two times a day (BID) | ORAL | 11 refills | Status: DC

## 2021-05-10 NOTE — Progress Notes (Signed)
HPI Richard [name] is referred for evaluation of PAF. He is a pleasant middle aged man with a h/o tobacco abuse, HTN, COPD, and sleep apnea (probable but undiagnosed), who has had trouble with PAF for over a year. He has been treated with flecainide which did not improve his symptoms. He has been on toprol. He has had a cath with no obtructive CAD. He is still smoking 5-6 cigs a day. He denies fever or chills.  Allergies  Allergen Reactions   Dexilant [Dexlansoprazole] Anaphylaxis   Mushroom Ext Cmplx(Shiitake-Reishi-Mait) Anaphylaxis    Rapid heart rate.   Penicillins Anaphylaxis    Has patient had a PCN reaction causing immediate rash, facial/tongue/throat swelling, SOB or lightheadedness with hypotension: Yes Has patient had a PCN reaction causing severe rash involving mucus membranes or skin necrosis: No Has patient had a PCN reaction that required hospitalization Yes Has patient had a PCN reaction occurring within the last 10 years: No If all of the above answers are "NO", then may proceed with Cephalosporin use.    Doxycycline Nausea And Vomiting          Current Outpatient Medications  Medication Sig Dispense Refill   ALPRAZolam (XANAX) 0.5 MG tablet Take one tablet by mouth three times a day and two tablets at night (Patient taking differently: Take one tablet by mouth three times a day and two tablets at night) 150 tablet 2   aluminum-magnesium hydroxide 200-200 MG/5ML suspension Take by mouth every 6 (six) hours as needed for indigestion.     amLODipine (NORVASC) 10 MG tablet Take 1 tablet (10 mg total) by mouth daily. 90 tablet 1   bisacodyl (BISACODYL LAXATIVE) 10 MG suppository Place 1 suppository (10 mg total) rectally as needed for moderate constipation. 12 suppository 0   budesonide-formoterol (SYMBICORT) 80-4.5 MCG/ACT inhaler Inhale 2 puffs into the lungs 2 (two) times daily as needed.     cyclobenzaprine (FLEXERIL) 10 MG tablet Take 1 tablet (10 mg total) by mouth  as needed for muscle spasms.     esomeprazole (NEXIUM) 40 MG capsule Take 1 capsule (40 mg total) by mouth 2 (two) times daily before a meal. 60 capsule 5   hydrocortisone (ANUSOL-HC) 2.5 % rectal cream Place 1 application rectally 2 (two) times daily. 30 g 0   JANUVIA 100 MG tablet TAKE 1 Tablet BY MOUTH ONCE DAILY 90 tablet 1   lidocaine (XYLOCAINE) 2 % solution Use as directed 15 mLs in the mouth or throat every 3 (three) hours as needed for mouth pain (gargle swish and spit  as needed for dental pain). 100 mL 0   metoprolol succinate (TOPROL-XL) 50 MG 24 hr tablet Take 1 tablet (50 mg total) by mouth daily. 135 tablet 3   nitroGLYCERIN (NITROSTAT) 0.4 MG SL tablet Place 1 tablet (0.4 mg total) under the tongue every 5 (five) minutes x 3 doses as needed for chest pain (if no relief after 3rd dose, proceed to the ED for an evaluation). 25 tablet 3   polyethylene glycol (MIRALAX / GLYCOLAX) 17 g packet Take 17 g by mouth daily as needed. 14 each 0   PROVENTIL HFA 108 (90 Base) MCG/ACT inhaler INHALE 2 PUFFS BY MOUTH EVERY 6 HOURS AS NEEDED FOR COUGHING, WHEEZING, OR SHORTNESS OF BREATH 13.4 g 1   simvastatin (ZOCOR) 20 MG tablet TAKE 1 TABLET BY MOUTH AT BEDTIME 30 tablet 2   trolamine salicylate (ASPERCREME) 10 % cream Apply 1 application topically 2 (two) times  daily as needed for muscle pain.     XARELTO 20 MG TABS tablet TAKE 1 Tablet BY MOUTH ONCE EVERY DAY WITH SUPPER 90 tablet 1   No current facility-administered medications for this visit.     Past Medical History:  Diagnosis Date   Arthritis    Asthma    CAD (coronary artery disease)    a. Cardiac cath 07/2015 showed 65% distal Cx, 20% mid-distal LAD, 20% prox-distal RCA, EF 60%, EDP 38mmHg.   Colitis 1990   COPD (chronic obstructive pulmonary disease) ([place])    Depression    Essential hypertension    Gastric ulcer 2003; 2012   2003: + esophagitis; negative H.pylori serology  2012: Dr. Oneida Alar, mild gastritis, Bravo PH probe  placement, negative H.pylori   GERD (gastroesophageal reflux disease)    Hepatic steatosis    History of hiatal hernia    Hyperlipemia    Overweight    Panic attacks    Paroxysmal atrial fibrillation (HCC)    Stroke ([place])    TIA (transient ischemic attack)    Type II diabetes mellitus ([place])     ROS:   All systems reviewed and negative except as noted in the HPI.   Past Surgical History:  Procedure Laterality Date   BRAVO [facility] STUDY  05/03/2011   DPO:EUMP gastritis/normal esophagus and duodenum   CARDIAC CATHETERIZATION  1990s X 1; 2005; 08/12/2015   CARDIAC CATHETERIZATION N/A 08/12/2015   Procedure: Left Heart Cath and Coronary Angiography;  Surgeon: Belva Crome, MD; LAD 20%, CFX 65%, RCA 20%, EF 60%    COLONOSCOPY  1990   COLONOSCOPY WITH PROPOFOL N/A 11/21/2016   Dr. Oneida Alar: non-thrombosed external hemorrhoids, one 6 mm polyp (polypoid lesion), internal hemorrhoids. TI Normal. 10 years screening   ESOPHAGOGASTRODUODENOSCOPY  05/03/2011   NTI:RWER gastritis   NECK MASS EXCISION Right    "done in dr's office; behind right ear/side of ncek"   POLYPECTOMY  11/21/2016   Procedure: POLYPECTOMY;  Surgeon: [name], MD;  Location: [facility] SUITE;  Service: Endoscopy;;  descending colon polyp   SHOULDER ARTHROSCOPY W/ ROTATOR CUFF REPAIR Right 2006   acromioclavicular joint arthrosis     Family History  Problem Relation Age of Onset   Lung cancer Mother    Alcohol abuse Mother    Heart attack Father 18   Diabetes Father    Alcohol abuse Father    Hypertension Brother    Hypertension Brother    Anxiety disorder Sister    Depression Sister    Anxiety disorder Sister    Heart attack Brother 34   Diabetes Brother    Hypertension Brother    Seizures Brother    Dementia Paternal Uncle    Dementia Cousin    ADD / ADHD Daughter    Colon cancer Neg Hx    Drug abuse Neg Hx    Bipolar disorder Neg Hx    OCD Neg Hx    Paranoid behavior Neg Hx    Schizophrenia Neg Hx     Sexual abuse Neg Hx    Physical abuse Neg Hx      Social History   Socioeconomic History   Marital status: Married    Spouse name: Not on file   Number of children: Not on file   Years of education: Not on file   Highest education level: Not on file  Occupational History   Occupation: full time    Employer: UNEMPLOYED  Tobacco Use   Smoking status:  Every Day    Packs/day: 0.25    Years: 25.00    Pack years: 6.25    Types: Cigarettes    Start date: 07/17/1982   Smokeless tobacco: Never   Tobacco comments:    4-6 cigarettes daily 04/27/2021  Vaping Use   Vaping Use: Never used  Substance and Sexual Activity   Alcohol use: No    Alcohol/week: 0.0 standard drinks   Drug use: No   Sexual activity: Yes    Birth control/protection: None  Other Topics Concern   Not on file  Social History Narrative   Pt lives in [place] with wife.  5 children.  Unemployed due to panic attacks and back pain   Social Determinants of Health   Financial Resource Strain: Not on file  Food Insecurity: Not on file  Transportation Needs: Not on file  Physical Activity: Not on file  Stress: Not on file  Social Connections: Not on file  Intimate Partner Violence: Not on file     BP 122/68   Pulse (!) 56   Ht 5\' 11"  (1.803 m)   Wt 181 lb (82.1 kg)   SpO2 96%   BMI 25.24 kg/m   Physical Exam:  Well appearing NAD HEENT: Unremarkable Neck:  No JVD, no thyromegally Lymphatics:  No adenopathy Back:  No CVA tenderness Lungs:  Clear with no wheezes HEART:  Regular rate rhythm, no murmurs, no rubs, no clicks Abd:  soft, positive bowel sounds, no organomegally, no rebound, no guarding Ext:  2 plus pulses, no edema, no cyanosis, no clubbing Skin:  No rashes no nodules Neuro:  CN II through XII intact, motor grossly intact  EKG - reviewed. NSR   Assess/Plan:  PAF - I have discussed the treatment options with the patient and recommended multaq 400 mg twice daily. Once he is able to  obtain coverage, the I will refer for atrial fib ablation. HTN - his bp is controlled. No change in meds. Coags - he has not had bleeding on xarelto. Tobacco abuse - he is encouraged to go ahead and to stop smoking.  Carleene Overlie Cathlyn Tersigni,MD

## 2021-05-10 NOTE — Patient Instructions (Signed)
Medication Instructions:   Start Multaq 400 mg Two Times Daily   *If you need a refill on your cardiac medications before your next appointment, please call your pharmacy*   Lab Work: NONE   If you have labs (blood work) drawn today and your tests are completely normal, you will receive your results only by: [place] (if you have MyChart) OR A paper copy in the mail If you have any lab test that is abnormal or we need to change your treatment, we will call you to review the results.   Testing/Procedures: EKG two weeks after starting Multaq 400 mg Two Times Daily    Follow-Up: At [facility], you and your health needs are our priority.  As part of our continuing mission to provide you with exceptional heart care, we have created designated Provider Care Teams.  These Care Teams include your primary Cardiologist (physician) and Advanced Practice Providers (APPs -  Physician Assistants and Nurse Practitioners) who all work together to provide you with the care you need, when you need it.  We recommend signing up for the patient portal called "MyChart".  Sign up information is provided on this After Visit Summary.  MyChart is used to connect with patients for Virtual Visits (Telemedicine).  Patients are able to view lab/test results, encounter notes, upcoming appointments, etc.  Non-urgent messages can be sent to your provider as well.   To learn more about what you can do with MyChart, go to [email or web address].    Your next appointment:   4 month(s)  The format for your next appointment:   In Person  Provider:   Cristopher Peru, MD   Other Instructions Thank you for choosing [facility]!

## 2021-05-10 NOTE — Telephone Encounter (Signed)
Pt notified that he has been approved for the [employer]) patient assistance program.

## 2021-05-19 ENCOUNTER — Telehealth: Payer: Self-pay | Admitting: Cardiology

## 2021-05-19 NOTE — Telephone Encounter (Signed)
Patient with diagnosis of atrial fibrillation on Xarelto for anticoagulation.    Procedure: 15 teeth extracted Date of procedure: TBD   CHA2DS2-VASc Score = 5   This indicates a 7.2% annual risk of stroke. The patient's score is based upon: CHF History: 0 HTN History: 1 Diabetes History: 1 Stroke History: 2 Vascular Disease History: 1 Age Score: 0 Gender Score: 0   CrCl 99 Platelet count 181  Patient does not require pre-op antibiotics for dental procedure.  Per office protocol, patient can hold Xarelto for 2 days prior to procedure.    Patient will not need bridging with Lovenox (enoxaparin) around procedure.

## 2021-05-19 NOTE — Telephone Encounter (Signed)
What dental office are you calling from? [facility]   What is your office phone number? [phone]   What is your fax number? [phone]   What type of procedure is the patient having performed? Possible extraction   What date is procedure scheduled or is the patient there now? 05/30/21   What is your question (ex. Antibiotics prior to procedure, holding medication-we need to know how long dentist wants pt to hold med)? Please advise what medications patients should hold, and what treatment plan they should proceed with.    (If the patient is currently at the dentist's office, call Pre-Op APP to address. If the patient is not currently in the dentist office, please route to the Pre-Op pool)

## 2021-05-19 NOTE — Telephone Encounter (Signed)
Verbalized to pt that medication has not been delivered to office yet. Will f/u with pt when medication comes in. Pt verbalized understanding and was thankful for the call.

## 2021-05-19 NOTE — Telephone Encounter (Signed)
Pt is f/u on the Multaq medication, he was told it would be delivered to the office. Please advise pt further

## 2021-05-19 NOTE — Telephone Encounter (Signed)
   Name: Richard [name]  DOB: 1966/03/29  MRN: [id number]   Primary Cardiologist: Rozann Lesches, MD  Chart reviewed as part of pre-operative protocol coverage.   Request sent states "please advise ... what treatment plan they should proceed with."  We don't provide a blanket clearance. We will provide clearance and recommendations based on the procedure requested. If tooth extraction is planned, we will need to know the number of teeth and anesthesia used.  I will route this recommendation to the requesting party via Epic fax function and remove from pre-op pool. Please call with questions.  Tami Lin Asaf Elmquist, PA 05/19/2021, 11:12 AM .

## 2021-05-19 NOTE — Telephone Encounter (Signed)
This Pt is having 15 extractions on the upper anterior.  This procedure requires 3-4 carpels of 1.7 of epinephrine

## 2021-05-20 ENCOUNTER — Telehealth: Payer: Self-pay | Admitting: Internal Medicine

## 2021-05-20 NOTE — Telephone Encounter (Signed)
Spoke to pt's wife and verbalized that [facility] is currently looking for pt's medication. Medication was sent to [facility] via [employer] and signed for in Receiving/Mail on 05/13/2021 at 9:55 am. Pt's verbalized understanding and was appreciative of call back.

## 2021-05-20 NOTE — Telephone Encounter (Signed)
Medication was located in receiving. Patient made aware,states they will pick up on 05/23/21

## 2021-05-20 NOTE — Telephone Encounter (Signed)
Spoke to pt's wife and verbalized that [facility] is currently looking for pt's medication. Medication was sent to [facility] via [employer] and signed for in Receiving/Mail on 05/13/2021 at 9:55 am. Pt's verbalized understanding and was appreciative of call back.

## 2021-05-20 NOTE — Telephone Encounter (Signed)
Assisted pt with getting requesting OTC medication (metamucil)  at [facility]

## 2021-05-20 NOTE — Telephone Encounter (Signed)
  Pt's wife calling, she said she spoke with [employer] team and she was told the multag was delivered and was received by the doctor's office

## 2021-05-20 NOTE — Telephone Encounter (Signed)
Mrs Salomon stopped in to check to see if we found her husbands medication yet? (Multaq)

## 2021-05-24 ENCOUNTER — Telehealth: Payer: Self-pay | Admitting: Internal Medicine

## 2021-05-24 ENCOUNTER — Other Ambulatory Visit: Payer: Self-pay | Admitting: General Practice

## 2021-05-24 NOTE — Telephone Encounter (Signed)
Attempted to call patient's wife.  No answer, voicemail full

## 2021-05-24 NOTE — Telephone Encounter (Signed)
Pt c/o medication issue:  1. Name of Medication: dronedarone (MULTAQ) 400 MG tablet  2. How are you currently taking this medication (dosage and times per day)? Not currently taking  3. Are you having a reaction (difficulty breathing--STAT)? no  4. What is your medication issue? Patient's wife states the patient is concerned about the side effects of the medication. She says he read the warning label and would like to speak with a nurse before taking it.

## 2021-05-24 NOTE — Telephone Encounter (Signed)
I will ask PharmD to speak with patients wife.

## 2021-05-25 ENCOUNTER — Encounter: Payer: Self-pay | Admitting: Gastroenterology

## 2021-05-25 NOTE — Telephone Encounter (Signed)
Message sent to pt via My Chart

## 2021-05-25 NOTE — Telephone Encounter (Signed)
Attempted to call pt/pt's wife with no answer. Voicemail is full.

## 2021-05-26 NOTE — Telephone Encounter (Signed)
Spoke to pt who had questions about being on Multaq and Simvastatin. Informed pt the best course of action would be to contact his pharmacy for medication interactions.  Pt also mentioned that he has a dental procedure on 05/30/2021 with extractions and would need cardiac clearance. Informed pt that [facility] has already reached out to the office for dental clinic.  Per phone note: Pt is to hold Xarelto 2 days prior to procedure. Pt notified and verbalized understanding.

## 2021-05-26 NOTE — Telephone Encounter (Signed)
Patient's wife returning call. She says to call: [phone]

## 2021-05-27 ENCOUNTER — Telehealth: Payer: Self-pay

## 2021-05-27 NOTE — Telephone Encounter (Signed)
Spoke to wife who stated she is concerned with the approval to hold pt's Xarelto for two (2) days prior to tooth extraction. Pt's wife stated that the last time pt had an extraction, he only held Xarelto for one (1) day prior. Please advise.

## 2021-05-27 NOTE — Telephone Encounter (Signed)
Patient's wife calling back. She states they were told to contact the pharmacist about his multaq, but when he did they said to ask [name] if it was okay to start.

## 2021-05-27 NOTE — Telephone Encounter (Signed)
Pt states that he read the booklet on Multaq and saw that it suggest not taking Multaq if your heart rate is in the 50's. Pt states that in the morning and when he sits down to rest his HR is in the low 50's. He would like to know if it is ok for him to take Multaq. Please advise.

## 2021-05-27 NOTE — Telephone Encounter (Signed)
Will not be having 15 teeth pulled, only 3, possibly 4.    Ok to hold Xarelto just 1 day, not 2.  Patient voiced understanding

## 2021-05-30 ENCOUNTER — Other Ambulatory Visit (HOSPITAL_COMMUNITY): Payer: Self-pay | Admitting: Psychiatry

## 2021-05-30 NOTE — Telephone Encounter (Signed)
Call for appt

## 2021-05-30 NOTE — Telephone Encounter (Signed)
   Pt's wife calling, she said dentist office need clearance fax to [place] Fax# [phone]. The procedure is today, they need it asap. Pt's wife also wants to get a call back once clearance sent

## 2021-05-30 NOTE — Telephone Encounter (Signed)
Noted. No additional input from preop APP needed at this time. Will remove from preop box.

## 2021-05-30 NOTE — Telephone Encounter (Signed)
I s/w [facility] health department. Confirmed procedure will be done under local anesthesia. I will update the pre op provider. I will fax clearance notes to  Fax# [phone].  I have left a message for pt that he will need to hold his Xarelto x 1 day prior to dental work and will resume once dentist/oral surgeon feels safe to resume Xarelto post procedure. Left message for the pt that he will not need an antibiotic prior to dental procedure.

## 2021-05-30 NOTE — Telephone Encounter (Addendum)
   Patient Name: Richard [name]  DOB: Dec 10, 1965 MRN: [id number]  Primary Cardiologist: Rozann Lesches, MD  Chart reviewed as part of pre-operative protocol coverage.  This appears to be a somewhat disjointed clearance over prior separate phone notes as well. I do not see we ever got a reply about anesthesia use from the other phone note.  Will route to callback to clarify with dentist office what kind of anesthesia. If local, OK to proceed as requested, and as per pharmacist recommendation, OK to hold Xarelto just 1 day. Per pharmacist review, patient does not require preop antibiotics for dental procedure.  If he is undergoing general anesthesia, we will need to speak with the patient first - I tried to call him but got VM only each time.  Charlie Pitter, PA-C 05/30/2021, 9:46 AM

## 2021-06-16 ENCOUNTER — Encounter (HOSPITAL_COMMUNITY): Payer: Self-pay | Admitting: Psychiatry

## 2021-06-16 ENCOUNTER — Telehealth (INDEPENDENT_AMBULATORY_CARE_PROVIDER_SITE_OTHER): Payer: Self-pay | Admitting: Psychiatry

## 2021-06-16 ENCOUNTER — Other Ambulatory Visit: Payer: Self-pay

## 2021-06-16 DIAGNOSIS — F4001 Agoraphobia with panic disorder: Secondary | ICD-10-CM

## 2021-06-16 MED ORDER — ALPRAZOLAM 0.5 MG PO TABS: ORAL_TABLET | ORAL | 2 refills | Status: DC

## 2021-06-16 NOTE — Progress Notes (Signed)
Virtual Visit via Telephone Note  I connected with [place] on 06/16/21 at  1:00 PM EST by telephone and verified that I am speaking with the correct person using two identifiers.  Location: Patient: home Provider: office   I discussed the limitations, risks, security and privacy concerns of performing an evaluation and management service by telephone and the availability of in person appointments. I also discussed with the patient that there may be a patient responsible charge related to this service. The patient expressed understanding and agreed to proceed.     I discussed the assessment and treatment plan with the patient. The patient was provided an opportunity to ask questions and all were answered. The patient agreed with the plan and demonstrated an understanding of the instructions.   The patient was advised to call back or seek an in-person evaluation if the symptoms worsen or if the condition fails to improve as anticipated.  I provided 14 minutes of non-face-to-face time during this encounter.   [name], MD  [facility] MD/PA/NP OP Progress Note  06/16/2021 1:21 PM TERRELL OSTRAND  MRN:  [id number]  Chief Complaint:  Chief Complaint   Depression; Anxiety; Follow-up    HPI: This patient is a 55 year old Caucasian male who lives with his wife in [place].  He is unemployed.  The patient returns for follow-up after about 8 months regarding his depression and anxiety.  He states that he is still being followed for atrial fibrillation.  He was prescribed a medication but he has not taken it because he is worried that it might drop his heart rate too low.  He has going to see cardiology next week.  He and his wife are still having significant financial problems.  He has applied numerous times for disability and has gotten rejected.  She is still working part-time.  He worries a lot about this.  He does gets depressed sometimes about the finances but does not had any thoughts  of self-harm or suicide.  He states that he does not want to take antidepressants because they never helped.  He is getting help from the Xanax.  He states that alleviates his anxiety and helps him to sleep. Visit Diagnosis:    ICD-10-CM   1. 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 Cardiac cath 07/2015 showed 65% distal Cx, 20% mid-distal LAD, 20% prox-distal RCA, EF 60%, EDP 16mmHg.   Colitis 1990   COPD (chronic obstructive pulmonary disease) ([place])    Depression    Essential hypertension    Gastric ulcer 2003; 2012   2003: + esophagitis; negative H.pylori serology  2012: Dr. Oneida Alar, mild gastritis, Bravo PH probe placement, negative H.pylori   GERD (gastroesophageal reflux disease)    Hepatic steatosis    History of hiatal hernia    Hyperlipemia    Overweight    Panic attacks    Paroxysmal atrial fibrillation ([place])    Stroke ([place])    TIA (transient ischemic attack)    Type II diabetes mellitus ([place])     Past Surgical History:  Procedure Laterality Date   BRAVO [facility] STUDY  05/03/2011   JGO:TLXB gastritis/normal esophagus and duodenum   CARDIAC CATHETERIZATION  1990s X 1; 2005; 08/12/2015   CARDIAC CATHETERIZATION N/A 08/12/2015  Procedure: Left Heart Cath and Coronary Angiography;  Surgeon: Belva Crome, MD; LAD 20%, CFX 65%, RCA 20%, EF 60%    COLONOSCOPY  1990   COLONOSCOPY WITH PROPOFOL N/A 11/21/2016   Dr. Oneida Alar: non-thrombosed external hemorrhoids, one 6 mm polyp (polypoid lesion), internal hemorrhoids. TI Normal. 10 years screening   ESOPHAGOGASTRODUODENOSCOPY  05/03/2011   WUJ:WJXB gastritis   NECK MASS EXCISION Right    "done in dr's office; behind right ear/side of ncek"   POLYPECTOMY  11/21/2016   Procedure: POLYPECTOMY;  Surgeon: [name], MD;  [facility];   Service: Endoscopy;;  descending colon polyp   SHOULDER ARTHROSCOPY W/ ROTATOR CUFF REPAIR Right 2006   acromioclavicular joint arthrosis    Family Psychiatric History: see below  Family History:  Family History  Problem Relation Age of Onset   Lung cancer Mother    Alcohol abuse Mother    Heart attack Father 11   Diabetes Father    Alcohol abuse Father    Hypertension Brother    Hypertension Brother    Anxiety disorder Sister    Depression Sister    Anxiety disorder Sister    Heart attack Brother 71   Diabetes Brother    Hypertension Brother    Seizures Brother    Dementia Paternal Uncle    Dementia Cousin    ADD / ADHD Daughter    Colon cancer Neg Hx    Drug abuse Neg Hx    Bipolar disorder Neg Hx    OCD Neg Hx    Paranoid behavior Neg Hx    Schizophrenia Neg Hx    Sexual abuse Neg Hx    Physical abuse Neg Hx     Social History:  Social History   Socioeconomic History   Marital status: Married    Spouse name: Not on file   Number of children: Not on file   Years of education: Not on file   Highest education level: Not on file  Occupational History   Occupation: full time    Employer: UNEMPLOYED  Tobacco Use   Smoking status: Every Day    Packs/day: 0.25    Years: 25.00    Pack years: 6.25    Types: Cigarettes    Start date: 07/17/1982   Smokeless tobacco: Never   Tobacco comments:    4-6 cigarettes daily 04/27/2021  Vaping Use   Vaping Use: Never used  Substance and Sexual Activity   Alcohol use: No    Alcohol/week: 0.0 standard drinks   Drug use: No   Sexual activity: Yes    Birth control/protection: None  Other Topics Concern   Not on file  Social History Narrative   Pt lives in [place] with wife.  5 children.  Unemployed due to panic attacks and back pain   Social Determinants of Health   Financial Resource Strain: Not on file  Food Insecurity: Not on file  Transportation Needs: Not on file  Physical Activity: Not on file  Stress:  Not on file  Social Connections: Not on file    Allergies:  Allergies  Allergen Reactions   Dexilant [Dexlansoprazole] Anaphylaxis   Mushroom Ext Cmplx(Shiitake-Reishi-Mait) Anaphylaxis    Rapid heart rate.   Penicillins Anaphylaxis    Has patient had a PCN reaction causing immediate rash, facial/tongue/throat swelling, SOB or lightheadedness with hypotension: Yes Has patient had a PCN reaction causing severe rash involving mucus membranes or skin necrosis: No Has patient had a PCN reaction that  required hospitalization Yes Has patient had a PCN reaction occurring within the last 10 years: No If all of the above answers are "NO", then may proceed with Cephalosporin use.    Doxycycline Nausea And Vomiting         Metabolic Disorder Labs: Lab Results  Component Value Date   HGBA1C 6.2 (H) 04/25/2021   MPG 131.24 04/25/2021   MPG 131.24 01/21/2021   No results found for: PROLACTIN Lab Results  Component Value Date   CHOL 109 04/25/2021   TRIG 100 04/25/2021   HDL 30 (L) 04/25/2021   CHOLHDL 3.6 04/25/2021   VLDL 20 04/25/2021   LDLCALC 59 04/25/2021   LDLCALC 61 01/21/2021   Lab Results  Component Value Date   TSH 3.119 10/01/2020   TSH 0.958 04/21/2011    Therapeutic Level Labs: No results found for: LITHIUM No results found for: VALPROATE No components found for:  CBMZ  Current Medications: Current Outpatient Medications  Medication Sig Dispense Refill   ALPRAZolam (XANAX) 0.5 MG tablet TAKE 1 TABLET BY MOUTH THREE TIMES DAILY AND  2  TABLETS  AT  NIGHT 150 tablet 2   aluminum-magnesium hydroxide 200-200 MG/5ML suspension Take by mouth every 6 (six) hours as needed for indigestion.     amLODipine (NORVASC) 10 MG tablet Take 1 tablet (10 mg total) by mouth daily. 90 tablet 1   bisacodyl (BISACODYL LAXATIVE) 10 MG suppository Place 1 suppository (10 mg total) rectally as needed for moderate constipation. 12 suppository 0   budesonide-formoterol (SYMBICORT)  80-4.5 MCG/ACT inhaler Inhale 2 puffs into the lungs 2 (two) times daily as needed.     cyclobenzaprine (FLEXERIL) 10 MG tablet Take 1 tablet (10 mg total) by mouth as needed for muscle spasms.     dronedarone (MULTAQ) 400 MG tablet Take 1 tablet (400 mg total) by mouth 2 (two) times daily with a meal. 60 tablet 11   esomeprazole (NEXIUM) 40 MG capsule Take 1 capsule (40 mg total) by mouth 2 (two) times daily before a meal. 60 capsule 5   hydrocortisone (ANUSOL-HC) 2.5 % rectal cream Place 1 application rectally 2 (two) times daily. 30 g 0   JANUVIA 100 MG tablet TAKE 1 Tablet BY MOUTH ONCE DAILY 90 tablet 1   lidocaine (XYLOCAINE) 2 % solution Use as directed 15 mLs in the mouth or throat every 3 (three) hours as needed for mouth pain (gargle swish and spit  as needed for dental pain). 100 mL 0   metoprolol succinate (TOPROL-XL) 50 MG 24 hr tablet TAKE 1 AND 1/2 TABS BY MOUTH WITH OR IMMEDIATELY FOLLOWING A MEAL. 135 tablet 3   nitroGLYCERIN (NITROSTAT) 0.4 MG SL tablet Place 1 tablet (0.4 mg total) under the tongue every 5 (five) minutes x 3 doses as needed for chest pain (if no relief after 3rd dose, proceed to the ED for an evaluation). 25 tablet 3   polyethylene glycol (MIRALAX / GLYCOLAX) 17 g packet Take 17 g by mouth daily as needed. 14 each 0   PROVENTIL HFA 108 (90 Base) MCG/ACT inhaler INHALE 2 PUFFS BY MOUTH EVERY 6 HOURS AS NEEDED FOR COUGHING, WHEEZING, OR SHORTNESS OF BREATH 13.4 g 1   simvastatin (ZOCOR) 20 MG tablet TAKE 1 TABLET BY MOUTH AT BEDTIME 30 tablet 2   trolamine salicylate (ASPERCREME) 10 % cream Apply 1 application topically 2 (two) times daily as needed for muscle pain.     XARELTO 20 MG TABS tablet TAKE 1 Tablet BY MOUTH  ONCE EVERY DAY WITH SUPPER 90 tablet 1   No current facility-administered medications for this visit.     Musculoskeletal: Strength & Muscle Tone: na Gait & Station: na Patient leans: N/A  Psychiatric Specialty Exam: Review of Systems   Cardiovascular:  Positive for palpitations.  Gastrointestinal:  Positive for nausea.  Psychiatric/Behavioral:  The patient is nervous/anxious.   All other systems reviewed and are negative.  There were no vitals taken for this visit.There is no height or weight on file to calculate BMI.  General Appearance: NA  Eye Contact:  NA  Speech:  Clear and Coherent  Volume:  Normal  Mood:  Anxious  Affect:  NA  Thought Process:  Goal Directed  Orientation:  Full (Time, Place, and Person)  Thought Content: WDL   Suicidal Thoughts:  No  Homicidal Thoughts:  No  Memory:  Immediate;   Good Recent;   Good Remote;   Good  Judgement:  Good  Insight:  Fair  Psychomotor Activity:  Decreased  Concentration:  Concentration: Good and Attention Span: Good  Recall:  Good  Fund of Knowledge: Good  Language: Good  Akathisia:  No  Handed:  Right  AIMS (if indicated): not done  Assets:  Communication Skills Desire for Improvement Resilience Social Support Talents/Skills  ADL's:  Intact  Cognition: WNL  Sleep:  Fair   Screenings: PHQ2-9    Flowsheet Row Video Visit from 06/16/2021 in [place] Patient Outreach Telephone from 08/30/2015 in [place] Patient Outreach Telephone from 08/26/2015 in [place]  PHQ-2 Total Score 1 2 4   PHQ-9 Total Score -- 8 8      Flowsheet Row Video Visit from 06/16/2021 in [facility] from 04/18/2021 in [facility] from 04/07/2021 in [place] No Risk No Risk No Risk        Assessment and Plan: This patient is a 55 year old male with a history of anxiety and panic attacks.  He denies significant depression right now.  He is responding well to Xanax 0.5 mg 3 times daily and 1 mg at bedtime for anxiety and sleep.  He will return to see me in 3 months  [name], MD 06/16/2021,  1:21 PM

## 2021-06-20 ENCOUNTER — Telehealth: Payer: Self-pay

## 2021-06-20 NOTE — Telephone Encounter (Signed)
Please call pt's wife. She wants to speak to Anna's nurse about his constipation.

## 2021-06-20 NOTE — Telephone Encounter (Signed)
I returned the pt's call and spoke with him and he advised me since day before yesterday he has not been able to have a BM. He states it is right there but will not come out. He stated about 9 this morning he had the urge to go but could not push it out like it is blocked. He states just like  before. I asked the pt has he had any Miralax today, he stated one dose. I advised him to go ahead and do another dose (1 capful in 8 ozs of water) until I hear from you. Pt expressed understanding of this. Please advise

## 2021-06-20 NOTE — Telephone Encounter (Signed)
Phoned and spoke with the pt and his wife and advised of the instructions from [name]. They both expressed understanding

## 2021-06-20 NOTE — Telephone Encounter (Signed)
Agree with additional Miralax. Try a dulcolax suppository as well, so we are hitting it from both ends.

## 2021-06-21 ENCOUNTER — Encounter: Payer: Self-pay | Admitting: Internal Medicine

## 2021-06-21 ENCOUNTER — Other Ambulatory Visit: Payer: Self-pay

## 2021-06-21 ENCOUNTER — Ambulatory Visit (INDEPENDENT_AMBULATORY_CARE_PROVIDER_SITE_OTHER): Payer: Self-pay | Admitting: Internal Medicine

## 2021-06-21 VITALS — BP 128/76 | HR 56 | Ht 71.0 in | Wt 180.2 lb

## 2021-06-21 DIAGNOSIS — I480 Paroxysmal atrial fibrillation: Secondary | ICD-10-CM

## 2021-06-21 MED ORDER — METOPROLOL SUCCINATE ER 25 MG PO TB24: 25.0000 mg | ORAL_TABLET | Freq: Every day | ORAL | 3 refills | Status: DC

## 2021-06-21 NOTE — Patient Instructions (Addendum)
Medication Instructions:  DECREASE Toprol XL to 25 mg daily    TAKE Multaq as directed   Labwork: None today  Testing/Procedures: None today  Follow-Up: 4 months  Any Other Special Instructions Will Be Listed Below (If Applicable).  If you need a refill on your cardiac medications before your next appointment, please call your pharmacy.

## 2021-06-21 NOTE — Progress Notes (Signed)
HPI [name] is referred for evaluation of PAF. He is a pleasant middle aged man with a h/o tobacco abuse, HTN, COPD, and sleep apnea (probable but undiagnosed), who has had trouble with PAF for over a year. He has been treated with flecainide which did not improve his symptoms. He has been on toprol. He has had a cath with no obtructive CAD. He is still smoking 5-6 cigs a day. He denies fever or chills. He has not started on his multaq as he was concerned about his sinus bradycardia. Allergies  Allergen Reactions   Dexilant [Dexlansoprazole] Anaphylaxis   Mushroom Ext Cmplx(Shiitake-Reishi-Mait) Anaphylaxis    Rapid heart rate.   Penicillins Anaphylaxis    Has patient had a PCN reaction causing immediate rash, facial/tongue/throat swelling, SOB or lightheadedness with hypotension: Yes Has patient had a PCN reaction causing severe rash involving mucus membranes or skin necrosis: No Has patient had a PCN reaction that required hospitalization Yes Has patient had a PCN reaction occurring within the last 10 years: No If all of the above answers are "NO", then may proceed with Cephalosporin use.    Doxycycline Nausea And Vomiting          Current Outpatient Medications  Medication Sig Dispense Refill   ALPRAZolam (XANAX) 0.5 MG tablet TAKE 1 TABLET BY MOUTH THREE TIMES DAILY AND  2  TABLETS  AT  NIGHT 150 tablet 2   aluminum-magnesium hydroxide 200-200 MG/5ML suspension Take by mouth every 6 (six) hours as needed for indigestion.     amLODipine (NORVASC) 10 MG tablet Take 1 tablet (10 mg total) by mouth daily. 90 tablet 1   bisacodyl (BISACODYL LAXATIVE) 10 MG suppository Place 1 suppository (10 mg total) rectally as needed for moderate constipation. 12 suppository 0   budesonide-formoterol (SYMBICORT) 80-4.5 MCG/ACT inhaler Inhale 2 puffs into the lungs 2 (two) times daily as needed.     cyclobenzaprine (FLEXERIL) 10 MG tablet Take 1 tablet (10 mg total) by mouth as needed for  muscle spasms.     esomeprazole (NEXIUM) 40 MG capsule Take 1 capsule (40 mg total) by mouth 2 (two) times daily before a meal. 60 capsule 5   hydrocortisone (ANUSOL-HC) 2.5 % rectal cream Place 1 application rectally 2 (two) times daily. 30 g 0   JANUVIA 100 MG tablet TAKE 1 Tablet BY MOUTH ONCE DAILY 90 tablet 1   lidocaine (XYLOCAINE) 2 % solution Use as directed 15 mLs in the mouth or throat every 3 (three) hours as needed for mouth pain (gargle swish and spit  as needed for dental pain). 100 mL 0   metoprolol succinate (TOPROL-XL) 50 MG 24 hr tablet TAKE 1 AND 1/2 TABS BY MOUTH WITH OR IMMEDIATELY FOLLOWING A MEAL. 135 tablet 3   nitroGLYCERIN (NITROSTAT) 0.4 MG SL tablet Place 1 tablet (0.4 mg total) under the tongue every 5 (five) minutes x 3 doses as needed for chest pain (if no relief after 3rd dose, proceed to the ED for an evaluation). 25 tablet 3   polyethylene glycol (MIRALAX / GLYCOLAX) 17 g packet Take 17 g by mouth daily as needed. 14 each 0   PROVENTIL HFA 108 (90 Base) MCG/ACT inhaler INHALE 2 PUFFS BY MOUTH EVERY 6 HOURS AS NEEDED FOR COUGHING, WHEEZING, OR SHORTNESS OF BREATH 13.4 g 1   simvastatin (ZOCOR) 20 MG tablet TAKE 1 TABLET BY MOUTH AT BEDTIME 30 tablet 2   trolamine salicylate (ASPERCREME) 10 % cream Apply 1 application  topically 2 (two) times daily as needed for muscle pain.     XARELTO 20 MG TABS tablet TAKE 1 Tablet BY MOUTH ONCE EVERY DAY WITH SUPPER 90 tablet 1   dronedarone (MULTAQ) 400 MG tablet Take 1 tablet (400 mg total) by mouth 2 (two) times daily with a meal. (Patient not taking: Reported on 06/21/2021) 60 tablet 11   No current facility-administered medications for this visit.     Past Medical History:  Diagnosis Date   Arthritis    Asthma    CAD (coronary artery disease)    a. Cardiac cath 07/2015 showed 65% distal Cx, 20% mid-distal LAD, 20% prox-distal RCA, EF 60%, EDP 51mmHg.   Colitis 1990   COPD (chronic obstructive pulmonary disease) ([place])     Depression    Essential hypertension    Gastric ulcer 2003; 2012   2003: + esophagitis; negative H.pylori serology  2012: Dr. Oneida Alar, mild gastritis, Bravo PH probe placement, negative H.pylori   GERD (gastroesophageal reflux disease)    Hepatic steatosis    History of hiatal hernia    Hyperlipemia    Overweight    Panic attacks    Paroxysmal atrial fibrillation ([facility])    Stroke ([place])    TIA (transient ischemic attack)    Type II diabetes mellitus ([place])     ROS:   All systems reviewed and negative except as noted in the HPI.   Past Surgical History:  Procedure Laterality Date   BRAVO [facility] STUDY  05/03/2011   JEH:UDJS gastritis/normal esophagus and duodenum   CARDIAC CATHETERIZATION  1990s X 1; 2005; 08/12/2015   CARDIAC CATHETERIZATION N/A 08/12/2015   Procedure: Left Heart Cath and Coronary Angiography;  Surgeon: Belva Crome, MD; LAD 20%, CFX 65%, RCA 20%, EF 60%    COLONOSCOPY  1990   COLONOSCOPY WITH PROPOFOL N/A 11/21/2016   Dr. Oneida Alar: non-thrombosed external hemorrhoids, one 6 mm polyp (polypoid lesion), internal hemorrhoids. TI Normal. 10 years screening   ESOPHAGOGASTRODUODENOSCOPY  05/03/2011   HFW:YOVZ gastritis   NECK MASS EXCISION Right    "done in dr's office; behind right ear/side of ncek"   POLYPECTOMY  11/21/2016   Procedure: POLYPECTOMY;  Surgeon: [name], MD;  [facility];  Service: Endoscopy;;  descending colon polyp   SHOULDER ARTHROSCOPY W/ ROTATOR CUFF REPAIR Right 2006   acromioclavicular joint arthrosis     Family History  Problem Relation Age of Onset   Lung cancer Mother    Alcohol abuse Mother    Heart attack Father 63   Diabetes Father    Alcohol abuse Father    Hypertension Brother    Hypertension Brother    Anxiety disorder Sister    Depression Sister    Anxiety disorder Sister    Heart attack Brother 90   Diabetes Brother    Hypertension Brother    Seizures Brother    Dementia Paternal Uncle    Dementia Cousin     ADD / ADHD Daughter    Colon cancer Neg Hx    Drug abuse Neg Hx    Bipolar disorder Neg Hx    OCD Neg Hx    Paranoid behavior Neg Hx    Schizophrenia Neg Hx    Sexual abuse Neg Hx    Physical abuse Neg Hx      Social History   Socioeconomic History   Marital status: Married    Spouse name: Not on file   Number of children: Not on file  Years of education: Not on file   Highest education level: Not on file  Occupational History   Occupation: full time    Employer: UNEMPLOYED  Tobacco Use   Smoking status: Every Day    Packs/day: 0.25    Years: 25.00    Pack years: 6.25    Types: Cigarettes    Start date: 07/17/1982   Smokeless tobacco: Never   Tobacco comments:    4-6 cigarettes daily 04/27/2021  Vaping Use   Vaping Use: Never used  Substance and Sexual Activity   Alcohol use: No    Alcohol/week: 0.0 standard drinks   Drug use: No   Sexual activity: Yes    Birth control/protection: None  Other Topics Concern   Not on file  Social History Narrative   Pt lives in [place] with wife.  5 children.  Unemployed due to panic attacks and back pain   Social Determinants of Health   Financial Resource Strain: Not on file  Food Insecurity: Not on file  Transportation Needs: Not on file  Physical Activity: Not on file  Stress: Not on file  Social Connections: Not on file  Intimate Partner Violence: Not on file     BP 128/76   Pulse (!) 56   Ht 5\' 11"  (1.803 m)   Wt 180 lb 3.2 oz (81.7 kg)   SpO2 97%   BMI 25.13 kg/m   Physical Exam:  Well appearing NAD HEENT: Unremarkable Neck:  No JVD, no thyromegally Lymphatics:  No adenopathy Back:  No CVA tenderness Lungs:  Clear with no wheezes HEART:  Regular rate rhythm, no murmurs, no rubs, no clicks Abd:  soft, positive bowel sounds, no organomegally, no rebound, no guarding Ext:  2 plus pulses, no edema, no cyanosis, no clubbing Skin:  No rashes no nodules Neuro:  CN II through XII intact, motor grossly  intact  EKG  DEVICE  Normal device function.  See PaceArt for details.   Assess/Plan:   PAF - I have discussed the treatment options with the patient and recommended multaq 400 mg twice daily. This was done at his last visit  but he wanted to talk to me first.  HTN - his bp is controlled. No change in meds. Coags - he has not had bleeding on xarelto. 4.   Tobacco abuse - he is encouraged to go ahead and stop      smoking.   Carleene Overlie Ava Tangney,MD

## 2021-06-23 ENCOUNTER — Ambulatory Visit: Payer: Self-pay | Admitting: Gastroenterology

## 2021-06-27 ENCOUNTER — Telehealth: Payer: Self-pay | Admitting: Internal Medicine

## 2021-06-27 NOTE — Telephone Encounter (Signed)
Pt c/o medication issue:  1. Name of Medication:  dronedarone (MULTAQ) 400 MG tablet  2. How are you currently taking this medication (dosage and times per day)? As prescribed   3. Are you having a reaction (difficulty breathing--STAT)? No   4. What is your medication issue? Patient's wife is calling stating Nayson started this medication on 06/25/21. Last time he started this medication he was advised he would need an EKG in 2 weeks. She is calling to confirm if he will be needing another EKG since starting.    [name] also states Nadav is requesting a referral to Dr. Laural Golden the Gastrologist in [place]. She states he is currently going to [facility] and would like to switch back to Dr. Laural Golden, but a referral is required.

## 2021-06-27 NOTE — Telephone Encounter (Signed)
Please confirm the need for EKG and referral to GI- Dr. Laural Golden.

## 2021-06-27 NOTE — Telephone Encounter (Signed)
I would like an ECG 2 weeks after starting the multaq.

## 2021-06-28 ENCOUNTER — Telehealth: Payer: Self-pay | Admitting: Cardiology

## 2021-06-28 NOTE — Telephone Encounter (Signed)
Nurse Visit scheduled for 12/23 @ 4pm for EKG per [name]- Multaq.

## 2021-06-28 NOTE — Telephone Encounter (Signed)
Gave wife details of pt's nurse visit that was made. Pt's wife verbalized understanding.

## 2021-06-28 NOTE — Telephone Encounter (Signed)
Patient was returning phone call from this morning 

## 2021-06-28 NOTE — Telephone Encounter (Signed)
Mailbox full, unable to leave message

## 2021-07-04 ENCOUNTER — Telehealth: Payer: Self-pay | Admitting: Internal Medicine

## 2021-07-04 ENCOUNTER — Other Ambulatory Visit (HOSPITAL_COMMUNITY): Payer: Self-pay | Admitting: Psychiatry

## 2021-07-04 ENCOUNTER — Other Ambulatory Visit: Payer: Self-pay | Admitting: Physician Assistant

## 2021-07-04 NOTE — Telephone Encounter (Signed)
Spoke with wife who states that d/t upcoming bad weather on Friday she would like to reschedule for Thursday. Pt encouraged not to miss any other doses.

## 2021-07-04 NOTE — Telephone Encounter (Signed)
Pt is wanting to know if they are able to reschedule visit on 12/23 due to possible bad weather... please advise    Pt c/o medication issue:  1. Name of Medication: dronedarone (MULTAQ) 400 MG tablet  2. How are you currently taking this medication (dosage and times per day)? Take 1 tablet (400 mg total) by mouth 2 (two) times daily with a meal.  3. Are you having a reaction (difficulty breathing--STAT)? no  4. What is your medication issue? Pt cant remember if he took his medication this morning and would like to know if he didn't would it be ok to skip a day to avoid possibly taking the medicine twice.

## 2021-07-07 ENCOUNTER — Ambulatory Visit (INDEPENDENT_AMBULATORY_CARE_PROVIDER_SITE_OTHER): Payer: Self-pay

## 2021-07-07 ENCOUNTER — Other Ambulatory Visit: Payer: Self-pay

## 2021-07-07 VITALS — BP 118/64 | HR 56

## 2021-07-07 DIAGNOSIS — I480 Paroxysmal atrial fibrillation: Secondary | ICD-10-CM

## 2021-07-07 DIAGNOSIS — Z79899 Other long term (current) drug therapy: Secondary | ICD-10-CM

## 2021-07-07 NOTE — Progress Notes (Signed)
Nurse visit today for EKG per Dr.Taylor   Patient taking multaq 400 mg BID   I will forward EKG to Dr.Taylor for review, EKG sinus bradycardia ,HR 56    Patient without complaints today.Of note, he has missed one dose of Multaq.

## 2021-07-08 ENCOUNTER — Ambulatory Visit: Payer: Self-pay

## 2021-07-13 ENCOUNTER — Ambulatory Visit (INDEPENDENT_AMBULATORY_CARE_PROVIDER_SITE_OTHER): Payer: Self-pay | Admitting: Gastroenterology

## 2021-07-13 ENCOUNTER — Ambulatory Visit: Payer: Self-pay | Admitting: Neurology

## 2021-07-13 ENCOUNTER — Encounter: Payer: Self-pay | Admitting: Gastroenterology

## 2021-07-13 ENCOUNTER — Encounter: Payer: Self-pay | Admitting: *Deleted

## 2021-07-13 ENCOUNTER — Other Ambulatory Visit: Payer: Self-pay

## 2021-07-13 VITALS — BP 139/81 | HR 65 | Temp 97.5°F | Ht 71.0 in | Wt 177.6 lb

## 2021-07-13 DIAGNOSIS — R0989 Other specified symptoms and signs involving the circulatory and respiratory systems: Secondary | ICD-10-CM

## 2021-07-13 DIAGNOSIS — R634 Abnormal weight loss: Secondary | ICD-10-CM

## 2021-07-13 DIAGNOSIS — E638 Other specified nutritional deficiencies: Secondary | ICD-10-CM

## 2021-07-13 NOTE — Progress Notes (Signed)
Referring Provider: [name], PA-C Primary Care Physician:  [name], PA-C Primary GI: Dr. Abbey Chatters  Chief Complaint  Patient presents with   knot in chest    Feels like knot in chest every time he eats.     HPI:   Richard [name] is a 55 y.o. male presenting today with a history of chronic GERD, abdominal pain, dysphagia, and constipation. 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 Last EGD in 2012.   Due to dysphagia and globus sensation, we had recommended EGD/dilation. However, he developed a dental infection and had to postpone.   Presenting today stating he has stopped sodas and caffeine. Continues with sensation of something mid chest/mid sternum despite eating. Eating worsens. He denies outright dysphagia. Feels like a knot comes up in chest with eating. He is not taking in much food as this worsens symptoms. He has been evaluated by Cardiology and follows closely with them. Fried foods cause right-sided abdomianl pain.   Was impacted and seen in ED recently. After Miralax purge, this resolved. Continues on fiber gummies now, with good results.   He has had unintentional weight loss. He was 193 in June 2022. Now 177. He attributes this to massively overhauling his diet and decreased oral intake. Last colonoscopy in 2018 with screening due again in 2028. CT abd/pelvis with contrast Sept 2022 unremarkable.   Past Medical History:  Diagnosis Date   Arthritis    Asthma    CAD (coronary artery disease)    a. Cardiac cath 07/2015 showed 65% distal Cx, 20% mid-distal LAD, 20% prox-distal RCA, EF 60%, EDP 75mmHg.   Colitis 1990   COPD (chronic obstructive pulmonary disease) ([place])    Depression    Essential hypertension    Gastric ulcer 2003; 2012   2003: + esophagitis; negative H.pylori serology  2012: Dr. Oneida Alar, mild gastritis, Bravo PH probe placement, negative H.pylori   GERD (gastroesophageal reflux disease)    Hepatic steatosis     History of hiatal hernia    Hyperlipemia    Overweight    Panic attacks    Paroxysmal atrial fibrillation (HCC)    Stroke ([place])    TIA (transient ischemic attack)    Type II diabetes mellitus ([place])     Past Surgical History:  Procedure Laterality Date   BRAVO [facility] STUDY  05/03/2011   ZLD:JTTS gastritis/normal esophagus and duodenum   CARDIAC CATHETERIZATION  1990s X 1; 2005; 08/12/2015   CARDIAC CATHETERIZATION N/A 08/12/2015   Procedure: Left Heart Cath and Coronary Angiography;  Surgeon: Belva Crome, MD; LAD 20%, CFX 65%, RCA 20%, EF 60%    COLONOSCOPY  1990   COLONOSCOPY WITH PROPOFOL N/A 11/21/2016   Dr. Oneida Alar: non-thrombosed external hemorrhoids, one 6 mm polyp (polypoid lesion), internal hemorrhoids. TI Normal. 10 years screening   ESOPHAGOGASTRODUODENOSCOPY  05/03/2011   VXB:LTJQ gastritis   NECK MASS EXCISION Right    "done in dr's office; behind right ear/side of ncek"   POLYPECTOMY  11/21/2016   Procedure: POLYPECTOMY;  Surgeon: [name], MD;  [facility];  Service: Endoscopy;;  descending colon polyp   SHOULDER ARTHROSCOPY W/ ROTATOR CUFF REPAIR Right 2006   acromioclavicular joint arthrosis    Current Outpatient Medications  Medication Sig Dispense Refill   ALPRAZolam (XANAX) 0.5 MG tablet TAKE 1 TABLET BY MOUTH THREE TIMES DAILY AND  2  TABLETS  AT  NIGHT 150 tablet 2   aluminum-magnesium hydroxide 200-200 MG/5ML suspension Take  by mouth every 6 (six) hours as needed for indigestion.     amLODipine (NORVASC) 10 MG tablet Take 1 tablet (10 mg total) by mouth daily. 90 tablet 1   bisacodyl (BISACODYL LAXATIVE) 10 MG suppository Place 1 suppository (10 mg total) rectally as needed for moderate constipation. 12 suppository 0   budesonide-formoterol (SYMBICORT) 80-4.5 MCG/ACT inhaler Inhale 2 puffs into the lungs 2 (two) times daily as needed.     dronedarone (MULTAQ) 400 MG tablet Take 1 tablet (400 mg total) by mouth 2 (two) times daily with a meal. 60  tablet 11   esomeprazole (NEXIUM) 40 MG capsule Take 1 capsule (40 mg total) by mouth 2 (two) times daily before a meal. 60 capsule 5   hydrocortisone (ANUSOL-HC) 2.5 % rectal cream Place 1 application rectally 2 (two) times daily. 30 g 0   JANUVIA 100 MG tablet TAKE 1 Tablet BY MOUTH ONCE DAILY 90 tablet 1   lidocaine (XYLOCAINE) 2 % solution Use as directed 15 mLs in the mouth or throat every 3 (three) hours as needed for mouth pain (gargle swish and spit  as needed for dental pain). 100 mL 0   metoprolol succinate (TOPROL XL) 25 MG 24 hr tablet Take 1 tablet (25 mg total) by mouth daily. 90 tablet 3   nitroGLYCERIN (NITROSTAT) 0.4 MG SL tablet Place 1 tablet (0.4 mg total) under the tongue every 5 (five) minutes x 3 doses as needed for chest pain (if no relief after 3rd dose, proceed to the ED for an evaluation). 25 tablet 3   OVER THE COUNTER MEDICATION Fiber gummy once a day.     polyethylene glycol (MIRALAX / GLYCOLAX) 17 g packet Take 17 g by mouth daily as needed. 14 each 0   PROVENTIL HFA 108 (90 Base) MCG/ACT inhaler INHALE 2 PUFFS BY MOUTH EVERY 6 HOURS AS NEEDED FOR COUGHING, WHEEZING, OR SHORTNESS OF BREATH 13.4 g 1   simvastatin (ZOCOR) 20 MG tablet TAKE 1 TABLET BY MOUTH AT BEDTIME 30 tablet 5   trolamine salicylate (ASPERCREME) 10 % cream Apply 1 application topically 2 (two) times daily as needed for muscle pain.     XARELTO 20 MG TABS tablet TAKE 1 Tablet BY MOUTH ONCE EVERY DAY WITH SUPPER 90 tablet 1   No current facility-administered medications for this visit.    Allergies as of 07/13/2021 - Review Complete 07/13/2021  Allergen Reaction Noted   Dexilant [dexlansoprazole] Anaphylaxis 01/20/2015   Mushroom ext cmplx(shiitake-reishi-mait) Anaphylaxis 03/22/2011   Penicillins Anaphylaxis    Doxycycline Nausea And Vomiting 04/18/2016    Family History  Problem Relation Age of Onset   Lung cancer Mother    Alcohol abuse Mother    Heart attack Father 61   Diabetes Father     Alcohol abuse Father    Hypertension Brother    Hypertension Brother    Anxiety disorder Sister    Depression Sister    Anxiety disorder Sister    Heart attack Brother 79   Diabetes Brother    Hypertension Brother    Seizures Brother    Dementia Paternal Uncle    Dementia Cousin    ADD / ADHD Daughter    Colon cancer Neg Hx    Drug abuse Neg Hx    Bipolar disorder Neg Hx    OCD Neg Hx    Paranoid behavior Neg Hx    Schizophrenia Neg Hx    Sexual abuse Neg Hx    Physical abuse Neg Hx  Social History   Socioeconomic History   Marital status: Married    Spouse name: Not on file   Number of children: Not on file   Years of education: Not on file   Highest education level: Not on file  Occupational History   Occupation: full time    Employer: UNEMPLOYED  Tobacco Use   Smoking status: Every Day    Packs/day: 0.25    Years: 25.00    Pack years: 6.25    Types: Cigarettes    Start date: 07/17/1982   Smokeless tobacco: Never   Tobacco comments:    4-6 cigarettes daily 04/27/2021  Vaping Use   Vaping Use: Never used  Substance and Sexual Activity   Alcohol use: No    Alcohol/week: 0.0 standard drinks   Drug use: No   Sexual activity: Yes    Birth control/protection: None  Other Topics Concern   Not on file  Social History Narrative   Pt lives in [place] with wife.  5 children.  Unemployed due to panic attacks and back pain   Social Determinants of Health   Financial Resource Strain: Not on file  Food Insecurity: Not on file  Transportation [name]: Not on file  Physical Activity: Not on file  Stress: Not on file  Social Connections: Not on file    Review of Systems: Gen: see HPI CV: Denies chest pain, palpitations, syncope, peripheral edema, and claudication. Resp: Denies dyspnea at rest, cough, wheezing, coughing up blood, and pleurisy. GI: see HPI  Derm: Denies rash, itching, dry skin Psych: Denies depression, anxiety, memory loss, confusion. No  homicidal or suicidal ideation.  Heme: Denies bruising, bleeding, and enlarged lymph nodes.  Physical Exam: BP 139/81    Pulse 65    Temp (!) 97.5 F (36.4 C) (Temporal)    Ht 5\' 11"  (1.803 m)    Wt 177 lb 9.6 oz (80.6 kg)    BMI 24.77 kg/m  General:   Alert and oriented. No distress noted. Pleasant and cooperative.  Head:  Normocephalic and atraumatic. Eyes:  Conjuctiva clear without scleral icterus. Mouth:  mask in place Cardiac: S1 S2 irregularly irregular  Lungs: clear bilaterally Abdomen:  +BS, soft, TTP epigastric and RUQ and non-distended. No rebound or guarding. No HSM or masses noted. Msk:  Symmetrical without gross deformities. Normal posture. Extremities:  Without edema. Neurologic:  Alert and  oriented x4 Psych:  Alert and cooperative. Normal mood and affect.  ASSESSMENT: ILAI HILLER is a 55 y.o. male presenting today  with a history of chronic GERD, abdominal pain, dysphagia, and constipation. 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 Last EGD in 2012. Due to dysphagia and globus sensation, we had recommended EGD/dilation. However, he developed a dental infection and had to postpone.   He continues to report postprandial "knot" in chest with eating, globus sensation, and has unintentional weight loss. He has changed his diet significantly, which could contribute to weight loss; however, he has had marked decrease since June 2022 (from 193 to 177). He is requesting nutritional labs.   CT abd/pelvis with contrast Sept 2022 unremarkable.   Constipation: managed well with fiber gummies. Colonoscopy on file from 2018 with plans for 2018.   As he is on Xarelto, this will need to be held. Cardiology is recommending only 24 hours on hold. Patient is aware of increased risks of bleeding.     PLAN:  Proceed with upper endoscopy.dilation by Dr. Abbey Chatters in near future: the risks, benefits,  and alternatives have been discussed with the patient in  detail. The patient states understanding and desires to proceed.   Continue Nexium BID  Hold Xarelto X 24 hours  Further recommendations to follow   Richard Needs, PhD, ANP-BC [facility]

## 2021-07-13 NOTE — H&P (View-Only) (Signed)
Referring Provider: [name], PA-C Primary Care Physician:  [name], PA-C Primary GI: Dr. Abbey Chatters  Chief Complaint  Patient presents with   knot in chest    Feels like knot in chest every time he eats.     HPI:   Richard [name] is a 55 y.o. male presenting today with a history of chronic GERD, abdominal pain, dysphagia, and constipation. 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 Last EGD in 2012.   Due to dysphagia and globus sensation, we had recommended EGD/dilation. However, he developed a dental infection and had to postpone.   Presenting today stating he has stopped sodas and caffeine. Continues with sensation of something mid chest/mid sternum despite eating. Eating worsens. He denies outright dysphagia. Feels like a knot comes up in chest with eating. He is not taking in much food as this worsens symptoms. He has been evaluated by Cardiology and follows closely with them. Fried foods cause right-sided abdomianl pain.   Was impacted and seen in ED recently. After Miralax purge, this resolved. Continues on fiber gummies now, with good results.   He has had unintentional weight loss. He was 193 in June 2022. Now 177. He attributes this to massively overhauling his diet and decreased oral intake. Last colonoscopy in 2018 with screening due again in 2028. CT abd/pelvis with contrast Sept 2022 unremarkable.   Past Medical History:  Diagnosis Date   Arthritis    Asthma    CAD (coronary artery disease)    a. Cardiac cath 07/2015 showed 65% distal Cx, 20% mid-distal LAD, 20% prox-distal RCA, EF 60%, EDP 54mmHg.   Colitis 1990   COPD (chronic obstructive pulmonary disease) ([place])    Depression    Essential hypertension    Gastric ulcer 2003; 2012   2003: + esophagitis; negative H.pylori serology  2012: Dr. Oneida Alar, mild gastritis, Bravo PH probe placement, negative H.pylori   GERD (gastroesophageal reflux disease)    Hepatic steatosis     History of hiatal hernia    Hyperlipemia    Overweight    Panic attacks    Paroxysmal atrial fibrillation (HCC)    Stroke ([place])    TIA (transient ischemic attack)    Type II diabetes mellitus ([place])     Past Surgical History:  Procedure Laterality Date   BRAVO [facility] STUDY  05/03/2011   XBD:ZHGD gastritis/normal esophagus and duodenum   CARDIAC CATHETERIZATION  1990s X 1; 2005; 08/12/2015   CARDIAC CATHETERIZATION N/A 08/12/2015   Procedure: Left Heart Cath and Coronary Angiography;  Surgeon: Belva Crome, MD; LAD 20%, CFX 65%, RCA 20%, EF 60%    COLONOSCOPY  1990   COLONOSCOPY WITH PROPOFOL N/A 11/21/2016   Dr. Oneida Alar: non-thrombosed external hemorrhoids, one 6 mm polyp (polypoid lesion), internal hemorrhoids. TI Normal. 10 years screening   ESOPHAGOGASTRODUODENOSCOPY  05/03/2011   JME:QAST gastritis   NECK MASS EXCISION Right    "done in dr's office; behind right ear/side of ncek"   POLYPECTOMY  11/21/2016   Procedure: POLYPECTOMY;  Surgeon: [name], MD;  [facility];  Service: Endoscopy;;  descending colon polyp   SHOULDER ARTHROSCOPY W/ ROTATOR CUFF REPAIR Right 2006   acromioclavicular joint arthrosis    Current Outpatient Medications  Medication Sig Dispense Refill   ALPRAZolam (XANAX) 0.5 MG tablet TAKE 1 TABLET BY MOUTH THREE TIMES DAILY AND  2  TABLETS  AT  NIGHT 150 tablet 2   aluminum-magnesium hydroxide 200-200 MG/5ML suspension Take  by mouth every 6 (six) hours as needed for indigestion.     amLODipine (NORVASC) 10 MG tablet Take 1 tablet (10 mg total) by mouth daily. 90 tablet 1   bisacodyl (BISACODYL LAXATIVE) 10 MG suppository Place 1 suppository (10 mg total) rectally as needed for moderate constipation. 12 suppository 0   budesonide-formoterol (SYMBICORT) 80-4.5 MCG/ACT inhaler Inhale 2 puffs into the lungs 2 (two) times daily as needed.     dronedarone (MULTAQ) 400 MG tablet Take 1 tablet (400 mg total) by mouth 2 (two) times daily with a meal. 60  tablet 11   esomeprazole (NEXIUM) 40 MG capsule Take 1 capsule (40 mg total) by mouth 2 (two) times daily before a meal. 60 capsule 5   hydrocortisone (ANUSOL-HC) 2.5 % rectal cream Place 1 application rectally 2 (two) times daily. 30 g 0   JANUVIA 100 MG tablet TAKE 1 Tablet BY MOUTH ONCE DAILY 90 tablet 1   lidocaine (XYLOCAINE) 2 % solution Use as directed 15 mLs in the mouth or throat every 3 (three) hours as needed for mouth pain (gargle swish and spit  as needed for dental pain). 100 mL 0   metoprolol succinate (TOPROL XL) 25 MG 24 hr tablet Take 1 tablet (25 mg total) by mouth daily. 90 tablet 3   nitroGLYCERIN (NITROSTAT) 0.4 MG SL tablet Place 1 tablet (0.4 mg total) under the tongue every 5 (five) minutes x 3 doses as needed for chest pain (if no relief after 3rd dose, proceed to the ED for an evaluation). 25 tablet 3   OVER THE COUNTER MEDICATION Fiber gummy once a day.     polyethylene glycol (MIRALAX / GLYCOLAX) 17 g packet Take 17 g by mouth daily as needed. 14 each 0   PROVENTIL HFA 108 (90 Base) MCG/ACT inhaler INHALE 2 PUFFS BY MOUTH EVERY 6 HOURS AS NEEDED FOR COUGHING, WHEEZING, OR SHORTNESS OF BREATH 13.4 g 1   simvastatin (ZOCOR) 20 MG tablet TAKE 1 TABLET BY MOUTH AT BEDTIME 30 tablet 5   trolamine salicylate (ASPERCREME) 10 % cream Apply 1 application topically 2 (two) times daily as needed for muscle pain.     XARELTO 20 MG TABS tablet TAKE 1 Tablet BY MOUTH ONCE EVERY DAY WITH SUPPER 90 tablet 1   No current facility-administered medications for this visit.    Allergies as of 07/13/2021 - Review Complete 07/13/2021  Allergen Reaction Noted   Dexilant [dexlansoprazole] Anaphylaxis 01/20/2015   Mushroom ext cmplx(shiitake-reishi-mait) Anaphylaxis 03/22/2011   Penicillins Anaphylaxis    Doxycycline Nausea And Vomiting 04/18/2016    Family History  Problem Relation Age of Onset   Lung cancer Mother    Alcohol abuse Mother    Heart attack Father 66   Diabetes Father     Alcohol abuse Father    Hypertension Brother    Hypertension Brother    Anxiety disorder Sister    Depression Sister    Anxiety disorder Sister    Heart attack Brother 38   Diabetes Brother    Hypertension Brother    Seizures Brother    Dementia Paternal Uncle    Dementia Cousin    ADD / ADHD Daughter    Colon cancer Neg Hx    Drug abuse Neg Hx    Bipolar disorder Neg Hx    OCD Neg Hx    Paranoid behavior Neg Hx    Schizophrenia Neg Hx    Sexual abuse Neg Hx    Physical abuse Neg Hx  Social History   Socioeconomic History   Marital status: Married    Spouse name: Not on file   Number of children: Not on file   Years of education: Not on file   Highest education level: Not on file  Occupational History   Occupation: full time    Employer: UNEMPLOYED  Tobacco Use   Smoking status: Every Day    Packs/day: 0.25    Years: 25.00    Pack years: 6.25    Types: Cigarettes    Start date: 07/17/1982   Smokeless tobacco: Never   Tobacco comments:    4-6 cigarettes daily 04/27/2021  Vaping Use   Vaping Use: Never used  Substance and Sexual Activity   Alcohol use: No    Alcohol/week: 0.0 standard drinks   Drug use: No   Sexual activity: Yes    Birth control/protection: None  Other Topics Concern   Not on file  Social History Narrative   Pt lives in [place] with wife.  5 children.  Unemployed due to panic attacks and back pain   Social Determinants of Health   Financial Resource Strain: Not on file  Food Insecurity: Not on file  Transportation Needs: Not on file  Physical Activity: Not on file  Stress: Not on file  Social Connections: Not on file    Review of Systems: Gen: see HPI CV: Denies chest pain, palpitations, syncope, peripheral edema, and claudication. Resp: Denies dyspnea at rest, cough, wheezing, coughing up blood, and pleurisy. GI: see HPI  Derm: Denies rash, itching, dry skin Psych: Denies depression, anxiety, memory loss, confusion. No  homicidal or suicidal ideation.  Heme: Denies bruising, bleeding, and enlarged lymph nodes.  Physical Exam: BP 139/81    Pulse 65    Temp (!) 97.5 F (36.4 C) (Temporal)    Ht 5\' 11"  (1.803 m)    Wt 177 lb 9.6 oz (80.6 kg)    BMI 24.77 kg/m  General:   Alert and oriented. No distress noted. Pleasant and cooperative.  Head:  Normocephalic and atraumatic. Eyes:  Conjuctiva clear without scleral icterus. Mouth:  mask in place Cardiac: S1 S2 irregularly irregular  Lungs: clear bilaterally Abdomen:  +BS, soft, TTP epigastric and RUQ and non-distended. No rebound or guarding. No HSM or masses noted. Msk:  Symmetrical without gross deformities. Normal posture. Extremities:  Without edema. Neurologic:  Alert and  oriented x4 Psych:  Alert and cooperative. Normal mood and affect.  ASSESSMENT: DERRIEN ANSCHUTZ is a 55 y.o. male presenting today  with a history of chronic GERD, abdominal pain, dysphagia, and constipation. 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 Last EGD in 2012. Due to dysphagia and globus sensation, we had recommended EGD/dilation. However, he developed a dental infection and had to postpone.   He continues to report postprandial "knot" in chest with eating, globus sensation, and has unintentional weight loss. He has changed his diet significantly, which could contribute to weight loss; however, he has had marked decrease since June 2022 (from 193 to 177). He is requesting nutritional labs.   CT abd/pelvis with contrast Sept 2022 unremarkable.   Constipation: managed well with fiber gummies. Colonoscopy on file from 2018 with plans for 2018.   As he is on Xarelto, this will need to be held. Cardiology is recommending only 24 hours on hold. Patient is aware of increased risks of bleeding.     PLAN:  Proceed with upper endoscopy.dilation by Dr. Abbey Chatters in near future: the risks, benefits,  and alternatives have been discussed with the patient in  detail. The patient states understanding and desires to proceed.   Continue Nexium BID  Hold Xarelto X 24 hours  Further recommendations to follow   Annitta Needs, PhD, ANP-BC [facility]

## 2021-07-13 NOTE — Patient Instructions (Signed)
I have ordered labs.  We have also arranged an endoscopy with dilation by Dr. Abbey Chatters.  Please stop Xarelto 24 hours prior to the procedure.  Further recommendations to follow!  I enjoyed seeing you again today! As you know, I value our relationship and want to provide genuine, compassionate, and quality care. I welcome your feedback. If you receive a survey regarding your visit,  I greatly appreciate you taking time to fill this out. See you next time!  Annitta Needs, PhD, ANP-BC [facility]

## 2021-07-14 ENCOUNTER — Telehealth: Payer: Self-pay | Admitting: *Deleted

## 2021-07-14 NOTE — Telephone Encounter (Signed)
Called pt and he is aware of pre-op appt details

## 2021-07-15 ENCOUNTER — Telehealth: Payer: Self-pay | Admitting: Internal Medicine

## 2021-07-15 NOTE — Patient Instructions (Signed)
Richard [name]  07/15/2021     @PREFPERIOPPHARMACY @   Your procedure is scheduled on  07/25/2021.   Report to [facility] at  1200  P.M.   Call this number if you have problems the morning of surgery:  [phone]   Remember:  Follow the diet instructions given to you by the office.    DO NOT take any medications for diabetes the morning of your procedure.    Use your inhalers before you come and bring your rescue inhaler with you.    Take these medicines the morning of surgery with A SIP OF WATER                    xanax(if needed), amlodipine, multac, nexium, metoprolol.     Do not wear jewelry, make-up or nail polish.  Do not wear lotions, powders, or perfumes, or deodorant.  Do not shave 48 hours prior to surgery.  Men may shave face and neck.  Do not bring valuables to the hospital.  [facility] is not responsible for any belongings or valuables.  Contacts, dentures or bridgework may not be worn into surgery.  Leave your suitcase in the car.  After surgery it may be brought to your room.  For patients admitted to the hospital, discharge time will be determined by your treatment team.  Patients discharged the day of surgery will not be allowed to drive home and must have someone with them for 24 hours.    Special instructions:   DO NOT smoke tobacco or vape for 24 hours before your procedure.  Please read over the following fact sheets that you were given. Anesthesia Post-op Instructions and Care and Recovery After Surgery      Upper Endoscopy, Adult, Care After This sheet gives you information about how to care for yourself after your procedure. Your health care provider may also give you more specific instructions. If you have problems or questions, contact your health care provider. What can I expect after the procedure? After the procedure, it is common to have: A sore throat. Mild stomach pain or discomfort. Bloating. Nausea. Follow these  instructions at home:  Follow instructions from your health care provider about what to eat or drink after your procedure. Return to your normal activities as told by your health care provider. Ask your health care provider what activities are safe for you. Take over-the-counter and prescription medicines only as told by your health care provider. If you were given a sedative during the procedure, it can affect you for several hours. Do not drive or operate machinery until your health care provider says that it is safe. Keep all follow-up visits as told by your health care provider. This is important. Contact a health care provider if you have: A sore throat that lasts longer than one day. Trouble swallowing. Get help right away if: You vomit blood or your vomit looks like coffee grounds. You have: A fever. Bloody, black, or tarry stools. A severe sore throat or you cannot swallow. Difficulty breathing. Severe pain in your chest or abdomen. Summary After the procedure, it is common to have a sore throat, mild stomach discomfort, bloating, and nausea. If you were given a sedative during the procedure, it can affect you for several hours. Do not drive or operate machinery until your health care provider says that it is safe. Follow instructions from your health care provider about what to eat or  drink after your procedure. Return to your normal activities as told by your health care provider. This information is not intended to replace advice given to you by your health care provider. Make sure you discuss any questions you have with your health care provider. Document Revised: 05/09/2019 Document Reviewed: 12/03/2017 Elsevier Patient Education  2022 [place]. Esophageal Dilatation Esophageal dilatation, also called esophageal dilation, is a procedure to widen or open a blocked or narrowed part of the esophagus. The esophagus is the part of the body that moves food and liquid from the  mouth to the stomach. You may need this procedure if: You have a buildup of scar tissue in your esophagus that makes it difficult, painful, or impossible to swallow. This can be caused by gastroesophageal reflux disease (GERD). You have cancer of the esophagus. There is a problem with how food moves through your esophagus. In some cases, you may need this procedure repeated at a later time to dilate the esophagus gradually. Tell a health care provider about: Any allergies you have. All medicines you are taking, including vitamins, herbs, eye drops, creams, and over-the-counter medicines. Any problems you or family members have had with anesthetic medicines. Any blood disorders you have. Any surgeries you have had. Any medical conditions you have. Any antibiotic medicines you are required to take before dental procedures. Whether you are pregnant or may be pregnant. What are the risks? Generally, this is a safe procedure. However, problems may occur, including: Bleeding due to a tear in the lining of the esophagus. A hole, or perforation, in the esophagus. What happens before the procedure? Ask your health care provider about: Changing or stopping your regular medicines. This is especially important if you are taking diabetes medicines or blood thinners. Taking medicines such as aspirin and ibuprofen. These medicines can thin your blood. Do not take these medicines unless your health care provider tells you to take them. Taking over-the-counter medicines, vitamins, herbs, and supplements. Follow instructions from your health care provider about eating or drinking restrictions. Plan to have a responsible adult take you home from the hospital or clinic. Plan to have a responsible adult care for you for the time you are told after you leave the hospital or clinic. This is important. What happens during the procedure? You may be given a medicine to help you relax (sedative). A numbing medicine  may be sprayed into the back of your throat, or you may gargle the medicine. Your health care provider may perform the dilatation using various surgical instruments, such as: Simple dilators. This instrument is carefully placed in the esophagus to stretch it. Guided wire bougies. This involves using an endoscope to insert a wire into the esophagus. A dilator is passed over this wire to enlarge the esophagus. Then the wire is removed. Balloon dilators. An endoscope with a small balloon is inserted into the esophagus. The balloon is inflated to stretch the esophagus and open it up. The procedure may vary among health care providers and hospitals. What can I expect after the procedure? Your blood pressure, heart rate, breathing rate, and blood oxygen level will be monitored until you leave the hospital or clinic. Your throat may feel slightly sore and numb. This will get better over time. You will not be allowed to eat or drink until your throat is no longer numb. When you are able to drink, urinate, and sit on the edge of the bed without nausea or dizziness, you may be able to return home.  Follow these instructions at home: Take over-the-counter and prescription medicines only as told by your health care provider. If you were given a sedative during the procedure, it can affect you for several hours. Do not drive or operate machinery until your health care provider says that it is safe. Plan to have a responsible adult care for you for the time you are told. This is important. Follow instructions from your health care provider about any eating or drinking restrictions. Do not use any products that contain nicotine or tobacco, such as cigarettes, e-cigarettes, and chewing tobacco. If you need help quitting, ask your health care provider. Keep all follow-up visits. This is important. Contact a health care provider if: You have a fever. You have pain that is not relieved by medicine. Get help right  away if: You have chest pain. You have trouble breathing. You have trouble swallowing. You vomit blood. You have black, tarry, or bloody stools. These symptoms may represent a serious problem that is an emergency. Do not wait to see if the symptoms will go away. Get medical help right away. Call your local emergency services (911 in the [place] Do not drive yourself to the hospital. Summary Esophageal dilatation, also called esophageal dilation, is a procedure to widen or open a blocked or narrowed part of the esophagus. Plan to have a responsible adult take you home from the hospital or clinic. For this procedure, a numbing medicine may be sprayed into the back of your throat, or you may gargle the medicine. Do not drive or operate machinery until your health care provider says that it is safe. This information is not intended to replace advice given to you by your health care provider. Make sure you discuss any questions you have with your health care provider. Document Revised: 11/19/2019 Document Reviewed: 11/19/2019 Elsevier Patient Education  [place] After This sheet gives you information about how to care for yourself after your procedure. Your health care provider may also give you more specific instructions. If you have problems or questions, contact your health care provider. What can I expect after the procedure? After the procedure, it is common to have: Tiredness. Forgetfulness about what happened after the procedure. Impaired judgment for important decisions. Nausea or vomiting. Some difficulty with balance. Follow these instructions at home: For the time period you were told by your health care provider:   Rest as needed. Do not participate in activities where you could fall or become injured. Do not drive or use machinery. Do not drink alcohol. Do not take sleeping pills or medicines that cause drowsiness. Do not make  important decisions or sign legal documents. Do not take care of children on your own. Eating and drinking Follow the diet that is recommended by your health care provider. Drink enough fluid to keep your urine pale yellow. If you vomit: Drink water, juice, or soup when you can drink without vomiting. Make sure you have little or no nausea before eating solid foods. General instructions Have a responsible adult stay with you for the time you are told. It is important to have someone help care for you until you are awake and alert. Take over-the-counter and prescription medicines only as told by your health care provider. If you have sleep apnea, surgery and certain medicines can increase your risk for breathing problems. Follow instructions from your health care provider about wearing your sleep device: Anytime you are sleeping, including during daytime naps. While taking  prescription pain medicines, sleeping medicines, or medicines that make you drowsy. Avoid smoking. Keep all follow-up visits as told by your health care provider. This is important. Contact a health care provider if: You keep feeling nauseous or you keep vomiting. You feel light-headed. You are still sleepy or having trouble with balance after 24 hours. You develop a rash. You have a fever. You have redness or swelling around the IV site. Get help right away if: You have trouble breathing. You have new-onset confusion at home. Summary For several hours after your procedure, you may feel tired. You may also be forgetful and have poor judgment. Have a responsible adult stay with you for the time you are told. It is important to have someone help care for you until you are awake and alert. Rest as told. Do not drive or operate machinery. Do not drink alcohol or take sleeping pills. Get help right away if you have trouble breathing, or if you suddenly become confused. This information is not intended to replace advice  given to you by your health care provider. Make sure you discuss any questions you have with your health care provider. Document Revised: 03/18/2020 Document Reviewed: 06/05/2019 Elsevier Patient Education  2022 [employer].

## 2021-07-15 NOTE — Telephone Encounter (Signed)
I advised patient  he could take a multivitamin as he states he does not eat well

## 2021-07-15 NOTE — Telephone Encounter (Signed)
Pt c/o medication issue:  1. Name of Medication:  Multivitamin   2. How are you currently taking this medication (dosage and times per day)?   3. Are you having a reaction (difficulty breathing--STAT)?   4. What is your medication issue?   Patient's wife would like to know if it would be alright for him to start taking a 1X/day multivitamin. Please advise.

## 2021-07-20 ENCOUNTER — Other Ambulatory Visit (HOSPITAL_COMMUNITY)
Admission: RE | Admit: 2021-07-20 | Discharge: 2021-07-20 | Disposition: A | Discharge status: Home / Self Care | Payer: Self-pay | Source: Ambulatory Visit | Attending: Gastroenterology | Admitting: Gastroenterology

## 2021-07-20 ENCOUNTER — Telehealth: Payer: Self-pay | Admitting: *Deleted

## 2021-07-20 ENCOUNTER — Encounter (HOSPITAL_COMMUNITY)
Admission: RE | Admit: 2021-07-20 | Discharge: 2021-07-20 | Disposition: A | Discharge status: Home / Self Care | Payer: Self-pay | Source: Ambulatory Visit | Attending: Internal Medicine | Admitting: Internal Medicine

## 2021-07-20 ENCOUNTER — Other Ambulatory Visit: Payer: Self-pay

## 2021-07-20 VITALS — BP 127/81 | HR 53 | Temp 97.3°F | Resp 18 | Ht 71.0 in | Wt 174.0 lb

## 2021-07-20 DIAGNOSIS — Z01812 Encounter for preprocedural laboratory examination: Secondary | ICD-10-CM | POA: Insufficient documentation

## 2021-07-20 DIAGNOSIS — R634 Abnormal weight loss: Secondary | ICD-10-CM | POA: Insufficient documentation

## 2021-07-20 DIAGNOSIS — E119 Type 2 diabetes mellitus without complications: Secondary | ICD-10-CM | POA: Insufficient documentation

## 2021-07-20 DIAGNOSIS — E638 Other specified nutritional deficiencies: Secondary | ICD-10-CM | POA: Insufficient documentation

## 2021-07-20 LAB — BASIC METABOLIC PANEL
Anion gap: 9 (ref 5–15)
BUN: 26 mg/dL — ABNORMAL HIGH (ref 6–20)
CO2: 22 mmol/L (ref 22–32)
Calcium: 9.1 mg/dL (ref 8.9–10.3)
Chloride: 107 mmol/L (ref 98–111)
Creatinine, Ser: 0.97 mg/dL (ref 0.61–1.24)
GFR, Estimated: >60 mL/min (ref >60)
Glucose, Bld: 123 mg/dL — ABNORMAL HIGH (ref 70–99)
Potassium: 4.1 mmol/L (ref 3.5–5.1)
Sodium: 138 mmol/L (ref 135–145)

## 2021-07-20 LAB — VITAMIN D 25 HYDROXY (VIT D DEFICIENCY, FRACTURES): Vit D, 25-Hydroxy: 30.4 ng/mL (ref 30–100)

## 2021-07-20 LAB — VITAMIN B12: Vitamin B-12: 294 pg/mL (ref 180–914)

## 2021-07-20 LAB — TSH: TSH: 1.23 u[IU]/mL (ref 0.350–4.500)

## 2021-07-20 NOTE — Telephone Encounter (Signed)
Spoke with pt. Procedure is not until 1:30pm. He is concerned not being able to have anything all morning. I advised pt he can have clear liquids (listed on instructions) up until 4 hours prior to procedure. He voiced understanding and was fine with this.

## 2021-07-21 ENCOUNTER — Ambulatory Visit: Payer: Self-pay | Admitting: Physician Assistant

## 2021-07-21 ENCOUNTER — Other Ambulatory Visit: Payer: Self-pay | Admitting: Physician Assistant

## 2021-07-21 ENCOUNTER — Other Ambulatory Visit: Payer: Self-pay | Admitting: Cardiology

## 2021-07-21 DIAGNOSIS — E118 Type 2 diabetes mellitus with unspecified complications: Secondary | ICD-10-CM

## 2021-07-21 DIAGNOSIS — R0989 Other specified symptoms and signs involving the circulatory and respiratory systems: Secondary | ICD-10-CM

## 2021-07-21 NOTE — Telephone Encounter (Signed)
Prescription refill request for Xarelto received.  Indication: PAF Last office visit: 06/21/21  Beckie Salts MD Weight: 81.7kg Age: 56 Scr: 0.97 on 07/20/21 CrCl:98.26  Based on above findings Xarelto 20mg  daily is the appropriate dose.  Refill approved.

## 2021-07-21 NOTE — Progress Notes (Signed)
° °  There were no vitals taken for this visit.   Subjective:    Patient ID: Richard [name], male    DOB: 1965-11-03, 56 y.o.   MRN: [id number]  HPI: Richard [name] is a 56 y.o. male presenting on 07/21/2021 for No chief complaint on file.   HPI   This is a telemedicine appointment via telephone as pt does not have a video enabled device with which to connect through [employer].  I connected with  Nathanel L Bree on 07/21/21 by a video enabled telemedicine application and verified that I am speaking with the correct person using two identifiers.   I discussed the limitations of evaluation and management by telemedicine. The patient expressed understanding and agreed to proceed.  Pt is at home.  Provider is at office.   Pt reports that his BS have been up and down.  He says mostly they are Dropping.  He says he eats cake and ice cream to fix low bs.  His Lowest bs  was 88.  He stopped his [place].    He says his highest bs was in the 140s.  Pt is having an Endoscopy next week because he has chest pains when he eats. This is causing him to not eat regularly.     Relevant past medical, surgical, family and social history reviewed and updated as indicated. Interim medical history since our last visit reviewed. Allergies and medications reviewed and updated.  Review of Systems  Per HPI unless specifically indicated above     Objective:    There were no vitals taken for this visit.  Wt Readings from Last 3 Encounters:  07/20/21 174 lb (78.9 kg)  07/13/21 177 lb 9.6 oz (80.6 kg)  06/21/21 180 lb 3.2 oz (81.7 kg)    Physical Exam Constitutional:      General: He is not in acute distress. Pulmonary:     Effort: Pulmonary effort is normal. No respiratory distress.     Comments: Pt is talking in complete sentences without dyspnea Neurological:     Mental Status: He is alert and oriented to person, place, and time.  Psychiatric:        Attention and Perception: Attention normal.         Speech: Speech normal.          Assessment & Plan:    Encounter Diagnoses  Name Primary?   Controlled diabetes mellitus type 2 with complications, unspecified whether long term insulin use (HCC) Yes   Globus sensation       -discussed with pt that regular feedings are important for good glycemic control.  due to pt having decreased intake as a result of his GI issues, he can just stay off his [place] for now.  He is encouraged to avoid cake and ice cream.   He is to continue with GI for evaluation of GI issues.  He has appointment here on 08/10/21.  His labs will be updated prior to that appointment.  He is to contact office sooner prn

## 2021-07-25 ENCOUNTER — Ambulatory Visit (HOSPITAL_COMMUNITY): Payer: Self-pay | Admitting: Anesthesiology

## 2021-07-25 ENCOUNTER — Ambulatory Visit (HOSPITAL_COMMUNITY)
Admission: RE | Admit: 2021-07-25 | Discharge: 2021-07-25 | Disposition: A | Discharge status: Home / Self Care | Payer: Self-pay | Source: Ambulatory Visit | Attending: Internal Medicine | Admitting: Internal Medicine

## 2021-07-25 ENCOUNTER — Encounter (HOSPITAL_COMMUNITY): Payer: Self-pay

## 2021-07-25 ENCOUNTER — Telehealth: Payer: Self-pay | Admitting: Cardiology

## 2021-07-25 ENCOUNTER — Encounter (HOSPITAL_COMMUNITY)
Admission: RE | Disposition: A | Discharge status: Home / Self Care | Payer: Self-pay | Source: Ambulatory Visit | Attending: Internal Medicine

## 2021-07-25 ENCOUNTER — Other Ambulatory Visit: Payer: Self-pay

## 2021-07-25 DIAGNOSIS — I10 Essential (primary) hypertension: Secondary | ICD-10-CM | POA: Insufficient documentation

## 2021-07-25 DIAGNOSIS — I2510 Atherosclerotic heart disease of native coronary artery without angina pectoris: Secondary | ICD-10-CM | POA: Insufficient documentation

## 2021-07-25 DIAGNOSIS — K2970 Gastritis, unspecified, without bleeding: Secondary | ICD-10-CM

## 2021-07-25 DIAGNOSIS — K2100 Gastro-esophageal reflux disease with esophagitis, without bleeding: Secondary | ICD-10-CM | POA: Insufficient documentation

## 2021-07-25 DIAGNOSIS — R1310 Dysphagia, unspecified: Secondary | ICD-10-CM

## 2021-07-25 DIAGNOSIS — K5900 Constipation, unspecified: Secondary | ICD-10-CM | POA: Insufficient documentation

## 2021-07-25 DIAGNOSIS — K449 Diaphragmatic hernia without obstruction or gangrene: Secondary | ICD-10-CM | POA: Insufficient documentation

## 2021-07-25 DIAGNOSIS — K224 Dyskinesia of esophagus: Secondary | ICD-10-CM | POA: Insufficient documentation

## 2021-07-25 DIAGNOSIS — Z8711 Personal history of peptic ulcer disease: Secondary | ICD-10-CM | POA: Insufficient documentation

## 2021-07-25 DIAGNOSIS — F32A Depression, unspecified: Secondary | ICD-10-CM | POA: Insufficient documentation

## 2021-07-25 DIAGNOSIS — F419 Anxiety disorder, unspecified: Secondary | ICD-10-CM | POA: Insufficient documentation

## 2021-07-25 DIAGNOSIS — K3189 Other diseases of stomach and duodenum: Secondary | ICD-10-CM

## 2021-07-25 DIAGNOSIS — R634 Abnormal weight loss: Secondary | ICD-10-CM | POA: Insufficient documentation

## 2021-07-25 DIAGNOSIS — F17210 Nicotine dependence, cigarettes, uncomplicated: Secondary | ICD-10-CM | POA: Insufficient documentation

## 2021-07-25 DIAGNOSIS — Z8673 Personal history of transient ischemic attack (TIA), and cerebral infarction without residual deficits: Secondary | ICD-10-CM | POA: Insufficient documentation

## 2021-07-25 DIAGNOSIS — E119 Type 2 diabetes mellitus without complications: Secondary | ICD-10-CM

## 2021-07-25 DIAGNOSIS — K2289 Other specified disease of esophagus: Secondary | ICD-10-CM | POA: Insufficient documentation

## 2021-07-25 DIAGNOSIS — J449 Chronic obstructive pulmonary disease, unspecified: Secondary | ICD-10-CM | POA: Insufficient documentation

## 2021-07-25 DIAGNOSIS — K219 Gastro-esophageal reflux disease without esophagitis: Secondary | ICD-10-CM | POA: Insufficient documentation

## 2021-07-25 HISTORY — PX: BALLOON DILATION: SHX5330

## 2021-07-25 HISTORY — PX: ESOPHAGOGASTRODUODENOSCOPY (EGD) WITH PROPOFOL: SHX5813

## 2021-07-25 HISTORY — PX: BIOPSY: SHX5522

## 2021-07-25 SURGERY — ESOPHAGOGASTRODUODENOSCOPY (EGD) WITH PROPOFOL
Anesthesia: General

## 2021-07-25 MED ORDER — LIDOCAINE HCL (CARDIAC) PF 100 MG/5ML IV SOSY
PREFILLED_SYRINGE | INTRAVENOUS | Status: DC | PRN
Start: 2021-07-25 — End: 2021-07-25
  Administered 2021-07-25: 60 mg via INTRAVENOUS

## 2021-07-25 MED ORDER — LACTATED RINGERS IV SOLN: INTRAVENOUS | Status: DC

## 2021-07-25 MED ORDER — LACTATED RINGERS IV SOLN: INTRAVENOUS | Status: DC | PRN

## 2021-07-25 MED ORDER — PROPOFOL 10 MG/ML IV BOLUS
INTRAVENOUS | Status: DC | PRN
  Administered 2021-07-25: 100 mg via INTRAVENOUS
  Administered 2021-07-25: 20 mg via INTRAVENOUS

## 2021-07-25 NOTE — Anesthesia Preprocedure Evaluation (Signed)
Anesthesia Evaluation  Patient identified by MRN, date of birth, ID band Patient awake    Reviewed: Allergy & Precautions, H&P , NPO status , Patient's Chart, lab work & pertinent test results, reviewed documented beta blocker date and time   Airway Mallampati: II  TM Distance: >3 FB Neck ROM: full    Dental no notable dental hx.    Pulmonary shortness of breath, asthma , COPD, Current Smoker,    Pulmonary exam normal breath sounds clear to auscultation       Cardiovascular Exercise Tolerance: Good hypertension, + CAD   Rhythm:regular Rate:Normal     Neuro/Psych PSYCHIATRIC DISORDERS Anxiety Depression TIA   GI/Hepatic Neg liver ROS, hiatal hernia, PUD, GERD  Medicated,  Endo/Other  negative endocrine ROSdiabetes  Renal/GU negative Renal ROS  negative genitourinary   Musculoskeletal   Abdominal   Peds  Hematology negative hematology ROS (+)   Anesthesia Other Findings   Reproductive/Obstetrics negative OB ROS                             Anesthesia Physical Anesthesia Plan  ASA: 3  Anesthesia Plan: General   Post-op Pain Management:    Induction:   PONV Risk Score and Plan: Propofol infusion  Airway Management Planned:   Additional Equipment:   Intra-op Plan:   Post-operative Plan:   Informed Consent: I have reviewed the patients History and Physical, chart, labs and discussed the procedure including the risks, benefits and alternatives for the proposed anesthesia with the patient or authorized representative who has indicated his/her understanding and acceptance.     Dental Advisory Given  Plan Discussed with: CRNA  Anesthesia Plan Comments:         Anesthesia Quick Evaluation

## 2021-07-25 NOTE — Telephone Encounter (Signed)
° °  Primary Cardiologist: Rozann Lesches, MD  Clinical pharmacist have reviewed the past medical history and medications for  Richard [name].  The following recommendations were made:  Patient with diagnosis of A Fib on Xarelto for anticoagulation.     Procedure: cleaning x ray and 4 teeth extractions 11 13 14 15  Date of procedure: 07/25/21     CHA2DS2-VASc Score = 5  This indicates a 7.2% annual risk of stroke. The patient's score is based upon: CHF History: 0 HTN History: 1 Diabetes History: 1 Stroke History: 2 Vascular Disease History: 1 Age Score: 0 Gender Score: 0     CrCl 97 mL/min Platelet count 181K   Patient does not require pre-op antibiotics for dental procedure.   Per office protocol, patient can hold Xarelto for 1 day prior to procedure.  I will route this recommendation to the requesting party via Epic fax function and remove from pre-op pool.  Please call with questions.  Jossie Ng. Thaddeaus Monica NP-C    07/25/2021, 10:00 AM [place] [address] Office [phone] Fax [phone]

## 2021-07-25 NOTE — Discharge Instructions (Addendum)
EGD Discharge instructions Please read the instructions outlined below and refer to this sheet in the next few weeks. These discharge instructions provide you with general information on caring for yourself after you leave the hospital. Your doctor may also give you specific instructions. While your treatment has been planned according to the most current medical practices available, unavoidable complications occasionally occur. If you have any problems or questions after discharge, please call your doctor. ACTIVITY You may resume your regular activity but move at a slower pace for the next 24 hours.  Take frequent rest periods for the next 24 hours.  Walking will help expel (get rid of) the air and reduce the bloated feeling in your abdomen.  No driving for 24 hours (because of the anesthesia (medicine) used during the test).  You may shower.  Do not sign any important legal documents or operate any machinery for 24 hours (because of the anesthesia used during the test).  NUTRITION Drink plenty of fluids.  You may resume your normal diet.  Begin with a light meal and progress to your normal diet.  Avoid alcoholic beverages for 24 hours or as instructed by your caregiver.  MEDICATIONS You may resume your normal medications unless your caregiver tells you otherwise.  WHAT YOU CAN EXPECT TODAY You may experience abdominal discomfort such as a feeling of fullness or gas pains.  FOLLOW-UP Your doctor will discuss the results of your test with you.  SEEK IMMEDIATE MEDICAL ATTENTION IF ANY OF THE FOLLOWING OCCUR: Excessive nausea (feeling sick to your stomach) and/or vomiting.  Severe abdominal pain and distention (swelling).  Trouble swallowing.  Temperature over 101 F (37.8 C).  Rectal bleeding or vomiting of blood.    Your EGD revealed mild amount inflammation in your stomach.  I took biopsies of this to rule out infection with a bacteria called H. pylori.  I also took biopsies of your  small bowel. Await pathology results, my office will contact you. You did have a slight narrowing of your esophagus so I stretched this with a balloon today. Hopefully this improves your symptoms. Continue on Nexium Follow up with GI in 3-4 months.    I hope you have a great rest of your week!  Elon Alas. Abbey Chatters, D.O. Gastroenterology and Hepatology [facility]

## 2021-07-25 NOTE — Interval H&P Note (Signed)
History and Physical Interval Note:  07/25/2021 1:13 PM  Richard [name]  has presented today for surgery, with the diagnosis of gerd, globus sensation.  The various methods of treatment have been discussed with the patient and family. After consideration of risks, benefits and other options for treatment, the patient has consented to  Procedure(s) with comments: ESOPHAGOGASTRODUODENOSCOPY (EGD) WITH PROPOFOL (N/A) - 1:30pm BALLOON DILATION (N/A) as a surgical intervention.  The patient's history has been reviewed, patient examined, no change in status, stable for surgery.  I have reviewed the patient's chart and labs.  Questions were answered to the patient's satisfaction.     Eloise Harman

## 2021-07-25 NOTE — Transfer of Care (Signed)
Immediate Anesthesia Transfer of Care Note  Patient: Richard [name]  Procedure(s) Performed: ESOPHAGOGASTRODUODENOSCOPY (EGD) WITH PROPOFOL BALLOON DILATION BIOPSY  Patient Location: PACU  Anesthesia Type:General  Level of Consciousness: sedated  Airway & Oxygen Therapy: Patient Spontanous Breathing  Post-op Assessment: Report given to RN and Post -op Vital signs reviewed and stable  Post vital signs: Reviewed and stable  Last Vitals:  Vitals Value Taken Time  BP    Temp    Pulse    Resp    SpO2      Last Pain:  Vitals:   07/25/21 1334  TempSrc:   PainSc: 0-No pain         Complications: No notable events documented.

## 2021-07-25 NOTE — Op Note (Signed)
[facility] Patient Name: Richard [name] Procedure Date: 07/25/2021 1:11 PM MRN: [id number] Date of Birth: [birth year] Attending MD: Elon Alas. Abbey Chatters DO CSN: [id number] Age: 56 Admit Type: Ambulatory Procedure:                Upper GI endoscopy Indications:              Epigastric abdominal pain, Dysphagia Providers:                Elon Alas. Abbey Chatters, DO, Hughie Closs, RN, Caprice Kluver, Nelma Rothman, Technician Referring MD:              Medicines:                See the Anesthesia note for documentation of the                            administered medications Complications:            No immediate complications. Estimated Blood Loss:     Estimated blood loss was minimal. Procedure:                Pre-Anesthesia Assessment:                           - The anesthesia plan was to use monitored                            anesthesia care (MAC).                           After obtaining informed consent, the endoscope was                            passed under direct vision. Throughout the                            procedure, the patient's blood pressure, pulse, and                            oxygen saturations were monitored continuously. The                            GIF-H190 ([id number]) scope was introduced through the                            mouth, and advanced to the second part of duodenum.                            The upper GI endoscopy was accomplished without                            difficulty. The patient tolerated the procedure                            well. Scope  In: 1:36:50 PM Scope Out: 1:41:01 PM Total Procedure Duration: 0 hours 4 minutes 11 seconds  Findings:      A small hiatal hernia was present.      Abnormal motility was noted in the esophagus. The cricopharyngeus was       normal. There is a decrease in motility of the esophageal body. The       distal esophagus/lower esophageal sphincter is open.      Preparations were made  for empiric dilation. A TTS dilator was passed       through the scope. Dilation with an 18-19-20 mm balloon dilator was       performed to 20 mm. Dilation was performed with a mild resistance at 20       mm. Estimated blood loss was none.      Localized mild inflammation characterized by erythema was found in the       gastric body. Biopsies were taken with a cold forceps for Helicobacter       pylori testing.      Diffuse mucosal variance characterized by white specks was found in the       duodenal bulb, in the first portion of the duodenum and in the second       portion of the duodenum. Biopsies were taken with a cold forceps for       histology. Impression:               - Small hiatal hernia.                           - Abnormal esophageal motility, established                            presbyesophagus.                           - Gastritis. Biopsied.                           - Mucosal variant in the duodenum. Biopsied. Moderate Sedation:      Per Anesthesia Care Recommendation:           - Patient has a contact number available for                            emergencies. The signs and symptoms of potential                            delayed complications were discussed with the                            patient. Return to normal activities tomorrow.                            Written discharge instructions were provided to the                            patient.                           - Resume previous diet.                           -  Continue present medications.                           - Await pathology results.                           - Repeat upper endoscopy PRN for retreatment.                           - Use Nexium (esomeprazole) 40 mg PO BID.                           - No ibuprofen, naproxen, or other non-steroidal                            anti-inflammatory drugs. Procedure Code(s):        --- Professional ---                           [phone],  Esophagogastroduodenoscopy, flexible,                            transoral; with biopsy, single or multiple Diagnosis Code(s):        --- Professional ---                           K44.9, Diaphragmatic hernia without obstruction or                            gangrene                           K22.8, Other specified diseases of esophagus                           K29.70, Gastritis, unspecified, without bleeding                           K31.89, Other diseases of stomach and duodenum                           R10.13, Epigastric pain                           R13.10, Dysphagia, unspecified CPT copyright 2019 American Medical Association. All rights reserved. The codes documented in this report are preliminary and upon coder review may  be revised to meet current compliance requirements. Elon Alas. Abbey Chatters, DO [place] Abbey Chatters, DO 07/25/2021 1:47:17 PM This report has been signed electronically. Number of Addenda: 0

## 2021-07-25 NOTE — Telephone Encounter (Signed)
Patient with diagnosis of A Fib on Xarelto for anticoagulation.    Procedure: cleaning x ray and 4 teeth extractions 11 13 14 15  Date of procedure: 07/25/21   CHA2DS2-VASc Score = 5  This indicates a 7.2% annual risk of stroke. The patient's score is based upon: CHF History: 0 HTN History: 1 Diabetes History: 1 Stroke History: 2 Vascular Disease History: 1 Age Score: 0 Gender Score: 0    CrCl 97 mL/min Platelet count 181K  Patient does not require pre-op antibiotics for dental procedure.  Per office protocol, patient can hold Xarelto for 1 day prior to procedure.

## 2021-07-25 NOTE — Telephone Encounter (Signed)
° °  Pre-operative Risk Assessment    Patient Name: Richard [name]  DOB: [birth year] MRN: [id number]     Request for Surgical Clearance    Procedure:  Dental Extraction - Amount of Teeth to be Pulled:  cleaning x ray and 4 teeth extractions 11 13 14 15   Date of Surgery:  Clearance 07/25/21                                 Surgeon:  Dr. Erik Obey Surgeon's Group or Practice Name:  [facility] Phone number:  [phone] Fax number:  [phone]   Type of Clearance Requested:   - Pharmacy:  Hold Rivaroxaban (Xarelto) Hold 1 day before treatment and then start back that night or the next day (based on recommendation sent from last clearance in November)   Type of Anesthesia:  Local    Additional requests/questions:  Office states the last clearance they received stated the patient would need clearance for every dental procedure, but this one says there is no need to hold xarelto. Office would like to verify   Signed, Selena Zobro   07/25/2021, 8:12 AM

## 2021-07-25 NOTE — Anesthesia Postprocedure Evaluation (Signed)
Anesthesia Post Note  Patient: CAPRICE WASKO  Procedure(s) Performed: ESOPHAGOGASTRODUODENOSCOPY (EGD) WITH PROPOFOL BALLOON DILATION BIOPSY  Patient location during evaluation: Phase II Anesthesia Type: General Level of consciousness: awake Pain management: pain level controlled Vital Signs Assessment: post-procedure vital signs reviewed and stable Respiratory status: spontaneous breathing and respiratory function stable Cardiovascular status: blood pressure returned to baseline and stable Postop Assessment: no headache and no apparent nausea or vomiting Anesthetic complications: no Comments: Late entry   No notable events documented.   Last Vitals:  Vitals:   07/25/21 1230 07/25/21 1352  BP: 133/71 (!) 106/42  Pulse: (!) 57 60  Resp: 18 18  Temp: 36.6 C 36.7 C  SpO2: 99% 100%    Last Pain:  Vitals:   07/25/21 1352  TempSrc: Oral  PainSc: 0-No pain                 Louann Sjogren

## 2021-07-26 LAB — GLUCOSE, CAPILLARY: Glucose-Capillary: 122 mg/dL — ABNORMAL HIGH (ref 70–99)

## 2021-07-28 ENCOUNTER — Other Ambulatory Visit: Payer: Self-pay | Admitting: Physician Assistant

## 2021-07-28 DIAGNOSIS — E785 Hyperlipidemia, unspecified: Secondary | ICD-10-CM

## 2021-07-28 DIAGNOSIS — I2510 Atherosclerotic heart disease of native coronary artery without angina pectoris: Secondary | ICD-10-CM

## 2021-07-28 DIAGNOSIS — I10 Essential (primary) hypertension: Secondary | ICD-10-CM

## 2021-07-28 DIAGNOSIS — E118 Type 2 diabetes mellitus with unspecified complications: Secondary | ICD-10-CM

## 2021-07-28 LAB — SURGICAL PATHOLOGY

## 2021-07-29 ENCOUNTER — Encounter (HOSPITAL_COMMUNITY): Payer: Self-pay | Admitting: Internal Medicine

## 2021-08-01 ENCOUNTER — Telehealth: Payer: Self-pay | Admitting: Gastroenterology

## 2021-08-01 NOTE — Telephone Encounter (Signed)
Please call patient, he is having a burning sensation in his stomach after he eats

## 2021-08-02 ENCOUNTER — Observation Stay (HOSPITAL_COMMUNITY)
Admission: EM | Admit: 2021-08-02 | Discharge: 2021-08-04 | Disposition: A | Discharge status: Home / Self Care | Payer: Self-pay | Attending: Family Medicine | Admitting: Family Medicine

## 2021-08-02 ENCOUNTER — Emergency Department (HOSPITAL_COMMUNITY): Payer: Self-pay

## 2021-08-02 ENCOUNTER — Other Ambulatory Visit: Payer: Self-pay

## 2021-08-02 ENCOUNTER — Encounter (HOSPITAL_COMMUNITY): Payer: Self-pay

## 2021-08-02 DIAGNOSIS — Z20822 Contact with and (suspected) exposure to covid-19: Secondary | ICD-10-CM | POA: Insufficient documentation

## 2021-08-02 DIAGNOSIS — R29818 Other symptoms and signs involving the nervous system: Secondary | ICD-10-CM

## 2021-08-02 DIAGNOSIS — Z8673 Personal history of transient ischemic attack (TIA), and cerebral infarction without residual deficits: Secondary | ICD-10-CM | POA: Insufficient documentation

## 2021-08-02 DIAGNOSIS — R079 Chest pain, unspecified: Secondary | ICD-10-CM

## 2021-08-02 DIAGNOSIS — I482 Chronic atrial fibrillation, unspecified: Secondary | ICD-10-CM

## 2021-08-02 DIAGNOSIS — Z7901 Long term (current) use of anticoagulants: Secondary | ICD-10-CM | POA: Insufficient documentation

## 2021-08-02 DIAGNOSIS — I10 Essential (primary) hypertension: Secondary | ICD-10-CM | POA: Diagnosis present

## 2021-08-02 DIAGNOSIS — Z79899 Other long term (current) drug therapy: Secondary | ICD-10-CM | POA: Insufficient documentation

## 2021-08-02 DIAGNOSIS — E1165 Type 2 diabetes mellitus with hyperglycemia: Secondary | ICD-10-CM

## 2021-08-02 DIAGNOSIS — I639 Cerebral infarction, unspecified: Principal | ICD-10-CM | POA: Diagnosis present

## 2021-08-02 DIAGNOSIS — J449 Chronic obstructive pulmonary disease, unspecified: Secondary | ICD-10-CM

## 2021-08-02 DIAGNOSIS — E782 Mixed hyperlipidemia: Secondary | ICD-10-CM

## 2021-08-02 DIAGNOSIS — F17210 Nicotine dependence, cigarettes, uncomplicated: Secondary | ICD-10-CM | POA: Insufficient documentation

## 2021-08-02 DIAGNOSIS — E876 Hypokalemia: Secondary | ICD-10-CM

## 2021-08-02 DIAGNOSIS — K219 Gastro-esophageal reflux disease without esophagitis: Secondary | ICD-10-CM | POA: Diagnosis present

## 2021-08-02 DIAGNOSIS — I2510 Atherosclerotic heart disease of native coronary artery without angina pectoris: Secondary | ICD-10-CM | POA: Insufficient documentation

## 2021-08-02 LAB — I-STAT CHEM 8, ED
BUN: 18 mg/dL (ref 6–20)
Calcium, Ion: 1.11 mmol/L — ABNORMAL LOW (ref 1.15–1.40)
Chloride: 101 mmol/L (ref 98–111)
Creatinine, Ser: 1.1 mg/dL (ref 0.61–1.24)
Glucose, Bld: 159 mg/dL — ABNORMAL HIGH (ref 70–99)
HCT: 41 % (ref 39.0–52.0)
Hemoglobin: 13.9 g/dL (ref 13.0–17.0)
Potassium: 3.4 mmol/L — ABNORMAL LOW (ref 3.5–5.1)
Sodium: 139 mmol/L (ref 135–145)
TCO2: 26 mmol/L (ref 22–32)

## 2021-08-02 LAB — DIFFERENTIAL
Abs Immature Granulocytes: 0.01 10*3/uL (ref 0.00–0.07)
Basophils Absolute: 0 10*3/uL (ref 0.0–0.1)
Basophils Relative: 0 %
Eosinophils Absolute: 0.2 10*3/uL (ref 0.0–0.5)
Eosinophils Relative: 3 %
Immature Granulocytes: 0 %
Lymphocytes Relative: 24 %
Lymphs Abs: 1.8 10*3/uL (ref 0.7–4.0)
Monocytes Absolute: 0.5 10*3/uL (ref 0.1–1.0)
Monocytes Relative: 6 %
Neutro Abs: 5.2 10*3/uL (ref 1.7–7.7)
Neutrophils Relative %: 67 %

## 2021-08-02 LAB — CBC
HCT: 41.5 % (ref 39.0–52.0)
Hemoglobin: 14.5 g/dL (ref 13.0–17.0)
MCH: 32.2 pg (ref 26.0–34.0)
MCHC: 34.9 g/dL (ref 30.0–36.0)
MCV: 92 fL (ref 80.0–100.0)
Platelets: 179 10*3/uL (ref 150–400)
RBC: 4.51 MIL/uL (ref 4.22–5.81)
RDW: 12.1 % (ref 11.5–15.5)
WBC: 7.7 10*3/uL (ref 4.0–10.5)
nRBC: 0 % (ref 0.0–0.2)

## 2021-08-02 LAB — APTT: aPTT: 32 seconds (ref 24–36)

## 2021-08-02 LAB — PROTIME-INR
INR: 1.3 — ABNORMAL HIGH (ref 0.8–1.2)
Prothrombin Time: 16.1 seconds — ABNORMAL HIGH (ref 11.4–15.2)

## 2021-08-02 LAB — CBG MONITORING, ED: Glucose-Capillary: 160 mg/dL — ABNORMAL HIGH (ref 70–99)

## 2021-08-02 NOTE — ED Triage Notes (Signed)
Pt arrived by [facility] from home complaining of pounding chest pain that started tonight around 10pm. Given 4 baby aspirin and 1 sublingual nitro via EMS.

## 2021-08-02 NOTE — ED Notes (Addendum)
CT 2331 Push button 2333 CP at 2230.  Lkw 2230 sudden onset l eye vision change- pt states he lost all vision . And says that he has slight left side weakness.    Did not take his blood thinner tonight. Took it yesterday

## 2021-08-02 NOTE — Telephone Encounter (Signed)
Dr. Abbey Chatters, The pt wants the results of his EGD please   Richard [name], The pt is calling because for the past few months off and on he has had burning in his abdomen every day. I asked him was he taking his Nexium he stated yes 2 at night that's OTC. I advised the pt that Nexium x 2 before meals. He has been taking them 2 at night which are OTC because he states he can't afford the Rx. He's finally having regular BM's. Please advise regarding the burning in his lower abdomen. States he has went from 174 lbs to 169 lbs because he doesn't want to eat.

## 2021-08-02 NOTE — ED Provider Notes (Signed)
[facility] EMERGENCY DEPARTMENT Provider Note   CSN: [id number] Arrival date & time: 08/02/21  2314     History  Chief Complaint  Patient presents with   Chest Pain    Richard [name] is a 56 y.o. male.  Patient brought to the emergency department from home after he developed chest pain.  Patient reports that symptoms began 1 to 1-1/2 hours before coming to the ER.  Patient described the symptoms as a bubble in the center of his chest that caused him to feel short of breath.  Patient reports that he also noted that he last vision in his left eye for some time.  He reports that everything went "dark".  He started to feel very anxious, took a Xanax and his anxiety improved as did his vision.  Upon arrival to the emergency department, however, patient reports that his foot feels numb.  He only noticed it after arriving in the emergency department.   HPI: A 56 year old patient with a history of CVA, treated diabetes, hypertension and hypercholesterolemia presents for evaluation of chest pain. Initial onset of pain was approximately 1-3 hours ago. The patient's chest pain is not worse with exertion. The patient's chest pain is middle- or left-sided, is not well-localized, is not described as heaviness/pressure/tightness, is not sharp and does not radiate to the arms/jaw/neck. The patient does not complain of nausea and denies diaphoresis. The patient has no history of peripheral artery disease, has not smoked in the past 90 days, has no relevant family history of coronary artery disease (first degree relative at less than age 62) and does not have an elevated BMI (>=30).   Home Medications Prior to Admission medications   Medication Sig Start Date End Date Taking? Authorizing Provider  albuterol (VENTOLIN HFA) 108 (90 Base) MCG/ACT inhaler INHALE 2 PUFFS BY MOUTH EVERY 6 HOURS AS NEEDED FOR COUGHING, WHEEZING, OR SHORTNESS OF BREATH 07/25/21   [name], PA-C  ALPRAZolam Duanne Moron) 0.5 MG  tablet TAKE 1 TABLET BY MOUTH THREE TIMES DAILY AND  2  TABLETS  AT  NIGHT 06/16/21   Cloria Spring, MD  aluminum-magnesium hydroxide 200-200 MG/5ML suspension Take by mouth every 6 (six) hours as needed for indigestion.    [provider]  amLODipine (NORVASC) 10 MG tablet TAKE 1 Tablet BY MOUTH ONCE DAILY **DOSE CHANGE** 07/21/21   Satira Sark, MD  bisacodyl (BISACODYL LAXATIVE) 10 MG suppository Place 1 suppository (10 mg total) rectally as needed for moderate constipation. 04/07/21   Maudie Flakes, MD  dronedarone (MULTAQ) 400 MG tablet Take 1 tablet (400 mg total) by mouth 2 (two) times daily with a meal. 05/10/21   Evans Lance, MD  esomeprazole (NEXIUM) 40 MG capsule Take 1 capsule (40 mg total) by mouth 2 (two) times daily before a meal. Patient taking differently: Take 40 mg by mouth daily. 06/03/20   Annitta Needs, NP  hydrocortisone (ANUSOL-HC) 2.5 % rectal cream Place 1 application rectally 2 (two) times daily. Patient taking differently: Place 1 application rectally daily as needed for hemorrhoids. 04/04/21   Erenest Rasher, PA-C  JANUVIA 100 MG tablet TAKE 1 Tablet BY MOUTH ONCE DAILY 07/25/21   [name], PA-C  lidocaine (XYLOCAINE) 2 % solution Use as directed 15 mLs in the mouth or throat every 3 (three) hours as needed for mouth pain (gargle swish and spit  as needed for dental pain). 01/22/21   Scot Jun, FNP  metoprolol succinate (TOPROL XL) 25  MG 24 hr tablet Take 1 tablet (25 mg total) by mouth daily. 06/21/21   Evans Lance, MD  nitroGLYCERIN (NITROSTAT) 0.4 MG SL tablet Place 1 tablet (0.4 mg total) under the tongue every 5 (five) minutes x 3 doses as needed for chest pain (if no relief after 3rd dose, proceed to the ED for an evaluation). 04/20/21   Allred, Jeneen Rinks, MD  OVER THE COUNTER MEDICATION Fiber gummy once a day.    [provider]  polyethylene glycol (MIRALAX / GLYCOLAX) 17 g packet Take 17 g by mouth daily as needed. 04/05/21    Davonna Belling, MD  simvastatin (ZOCOR) 20 MG tablet TAKE 1 TABLET BY MOUTH AT BEDTIME 07/04/21   [name], PA-C  trolamine salicylate (ASPERCREME) 10 % cream Apply 1 application topically 2 (two) times daily as needed for muscle pain.    [provider]  XARELTO 20 MG TABS tablet TAKE 1 Tablet BY MOUTH ONCE EVERY DAY WITH SUPPER 07/21/21   Satira Sark, MD      Allergies    Dexilant [dexlansoprazole], Mushroom ext cmplx(shiitake-reishi-mait), Penicillins, and Doxycycline    Review of Systems   Review of Systems  Eyes:  Positive for visual disturbance.  Cardiovascular:  Positive for chest pain.  Neurological:  Positive for numbness.  All other systems reviewed and are negative.  Physical Exam Updated Vital Signs BP 110/66    Pulse (!) 55    Temp 97.6 F (36.4 C) (Oral)    Resp 14    Ht 5\' 11"  (1.803 m)    Wt 78.9 kg    SpO2 98%    BMI 24.27 kg/m  Physical Exam Vitals and nursing note reviewed.  Constitutional:      General: He is not in acute distress.    Appearance: Normal appearance. He is well-developed.  HENT:     Head: Normocephalic and atraumatic.     Right Ear: Hearing normal.     Left Ear: Hearing normal.     Nose: Nose normal.  Eyes:     Conjunctiva/sclera: Conjunctivae normal.     Pupils: Pupils are equal, round, and reactive to light.  Cardiovascular:     Rate and Rhythm: Regular rhythm.     Heart sounds: S1 normal and S2 normal. No murmur heard.   No friction rub. No gallop.  Pulmonary:     Effort: Pulmonary effort is normal. No respiratory distress.     Breath sounds: Normal breath sounds.  Chest:     Chest wall: No tenderness.  Abdominal:     General: Bowel sounds are normal.     Palpations: Abdomen is soft.     Tenderness: There is no abdominal tenderness. There is no guarding or rebound. Negative signs include Murphy's sign and McBurney's sign.     Hernia: No hernia is present.  Musculoskeletal:        General: Normal range of  motion.     Cervical back: Normal range of motion and neck supple.  Skin:    General: Skin is warm and dry.     Findings: No rash.  Neurological:     Mental Status: He is alert and oriented to person, place, and time.     GCS: GCS eye subscore is 4. GCS verbal subscore is 5. GCS motor subscore is 6.     Cranial Nerves: No cranial nerve deficit.     Sensory: No sensory deficit.     Coordination: Coordination normal.  Psychiatric:  Speech: Speech normal.        Behavior: Behavior normal.        Thought Content: Thought content normal.    ED Results / Procedures / Treatments   Labs (all labs ordered are listed, but only abnormal results are displayed) Labs Reviewed  PROTIME-INR - Abnormal; Notable for the following components:      Result Value   Prothrombin Time 16.1 (*)    INR 1.3 (*)    All other components within normal limits  COMPREHENSIVE METABOLIC PANEL - Abnormal; Notable for the following components:   Potassium 3.4 (*)    Glucose, Bld 161 (*)    Calcium 8.6 (*)    All other components within normal limits  RAPID URINE DRUG SCREEN, HOSP PERFORMED - Abnormal; Notable for the following components:   Benzodiazepines POSITIVE (*)    All other components within normal limits  CBG MONITORING, ED - Abnormal; Notable for the following components:   Glucose-Capillary 160 (*)    All other components within normal limits  I-STAT CHEM 8, ED - Abnormal; Notable for the following components:   Potassium 3.4 (*)    Glucose, Bld 159 (*)    Calcium, Ion 1.11 (*)    All other components within normal limits  RESP PANEL BY RT-PCR (FLU A&B, COVID) ARPGX2  ETHANOL  APTT  CBC  DIFFERENTIAL  URINALYSIS, ROUTINE W REFLEX MICROSCOPIC  TROPONIN I (HIGH SENSITIVITY)  TROPONIN I (HIGH SENSITIVITY)    EKG EKG Interpretation  Date/Time:  Tuesday August 02 2021 23:23:36 EST Ventricular Rate:  58 PR Interval:  178 QRS Duration: 99 QT Interval:  452 QTC Calculation: 444 R  Axis:   75 Text Interpretation: Sinus rhythm Atrial premature complexes Nonspecific T abnormalities, lateral leads Baseline wander in lead(s) I III aVL Confirmed by Orpah Greek ([id number]) on 08/03/2021 2:06:16 AM  Radiology DG Chest Port 1 View  Result Date: 08/03/2021 CLINICAL DATA:  Chest pain. EXAM: PORTABLE CHEST 1 VIEW COMPARISON:  Chest radiograph dated 04/18/2021. FINDINGS: No focal consolidation, pleural effusion or pneumothorax. Top-normal cardiac size. No acute osseous pathology. IMPRESSION: No active cardiopulmonary disease. Electronically Signed   By: Anner Crete M.D.   On: 08/03/2021 00:39   CT HEAD CODE STROKE WO CONTRAST  Result Date: 08/02/2021 CLINICAL DATA:  Code stroke. Initial evaluation for acute left-sided weakness. EXAM: CT HEAD WITHOUT CONTRAST TECHNIQUE: Contiguous axial images were obtained from the base of the skull through the vertex without intravenous contrast. RADIATION DOSE REDUCTION: This exam was performed according to the departmental dose-optimization program which includes automated exposure control, adjustment of the mA and/or kV according to patient size and/or use of iterative reconstruction technique. COMPARISON:  Prior MRI from 10/01/2020 FINDINGS: Brain: Cerebral volume within normal limits. No acute intracranial hemorrhage. No acute large vessel territory infarct. No mass lesion, midline shift or mass effect. No hydrocephalus or extra-axial fluid collection. Vascular: No hyperdense vessel. Skull: Scalp soft tissues and calvarium within normal limits. Sinuses/Orbits: Globes and orbital soft tissues demonstrate no acute finding. Mild scattered mucosal thickening noted within the ethmoidal air cells. Paranasal sinuses are otherwise clear. No mastoid effusion. Other: None. ASPECTS [facility] Stroke Program Early CT Score) - Ganglionic level infarction (caudate, lentiform nuclei, internal capsule, insula, M1-M3 cortex): 7. - Supraganglionic infarction (M4-M6  cortex): 3 Total score (0-10 with 10 being normal): 10 IMPRESSION: 1. Negative head CT.  No acute intracranial abnormality. 2. ASPECTS is 10. Critical Value/emergent results were called by telephone at the time of  interpretation on 08/02/2021 at 11:48 pm to provider [facility] , who verbally acknowledged these results. Electronically Signed   By: Jeannine Boga M.D.   On: 08/02/2021 23:51   CT ANGIO HEAD NECK W WO CM (CODE STROKE)  Result Date: 08/03/2021 CLINICAL DATA:  Initial evaluation for neuro deficit, left-sided weakness and visual disturbance. EXAM: CT ANGIOGRAPHY HEAD AND NECK TECHNIQUE: Multidetector CT imaging of the head and neck was performed using the standard protocol during bolus administration of intravenous contrast. Multiplanar CT image reconstructions and MIPs were obtained to evaluate the vascular anatomy. Carotid stenosis measurements (when applicable) are obtained utilizing NASCET criteria, using the distal internal carotid diameter as the denominator. RADIATION DOSE REDUCTION: This exam was performed according to the departmental dose-optimization program which includes automated exposure control, adjustment of the mA and/or kV according to patient size and/or use of iterative reconstruction technique. CONTRAST:  168mL OMNIPAQUE IOHEXOL 350 MG/ML SOLN COMPARISON:  Prior CT from 08/02/2021. FINDINGS: CTA NECK FINDINGS Aortic arch: Visualized aortic arch normal caliber with normal branch pattern. No stenosis or other abnormality about the origin of the great vessels. Right carotid system: Right common and internal carotid arteries widely patent without stenosis, dissection or occlusion. Left carotid system: Left common and internal carotid arteries widely patent without stenosis, dissection or occlusion. Vertebral arteries: Both vertebral arteries arise from the subclavian arteries. No proximal subclavian artery stenosis. Both vertebral arteries widely patent without stenosis,  dissection or occlusion. Skeleton: No discrete or worrisome osseous lesions. Mild-to-moderate spondylosis present at C4-5 through C6-7. Other neck: No other acute soft tissue abnormality within the neck. Upper chest: Visualized upper chest demonstrates no acute finding. Review of the MIP images confirms the above findings CTA HEAD FINDINGS Anterior circulation: Petrous segments patent bilaterally. Mild for age atheromatous change within the carotid siphons without significant stenosis. A1 segments patent bilaterally. Left A1 hypoplastic. Normal anterior communicating artery complex. Anterior cerebral arteries patent without stenosis. No M1 stenosis or occlusion. Normal MCA bifurcations. Distal MCA branches perfused and symmetric. Posterior circulation: Both vertebral arteries patent to the vertebrobasilar junction without stenosis. Right vertebral artery dominant. Both PICA origins patent and normal. Basilar patent to its distal aspect without stenosis. Superior cerebellar and posterior cerebral arteries widely patent bilaterally. Venous sinuses: Grossly patent allowing for timing the contrast bolus. Anatomic variants: None significant.  No aneurysm. Review of the MIP images confirms the above findings IMPRESSION: Normal CTA of the head and neck. No large vessel occlusion, hemodynamically significant stenosis, or other acute vascular abnormality. Electronically Signed   By: Jeannine Boga M.D.   On: 08/03/2021 01:16    Procedures Procedures    Medications Ordered in ED Medications  iohexol (OMNIPAQUE) 350 MG/ML injection 100 mL (100 mLs Intravenous Contrast Given 08/03/21 0018)    ED Course/ Medical Decision Making/ A&P   HEAR Score: 4                       Medical Decision Making Amount and/or Complexity of Data Reviewed Labs: ordered. Radiology: ordered.  Risk Prescription drug management.   Patient with history of hypertension, high cholesterol, diabetes, prior stroke initially called  EMS because of atypical chest pain.  Patient had a sensation of a bubble in the center of his chest, and while at rest.  He does report a recent history of esophageal dilation.  He does have multiple cardiac risk factors.  Initial EKG with nonspecific changes, first troponin negative.  Upon arrival to the department, however,  patient also related that he temporarily lost vision in his left eye.  This had returned by the time he got to the apartment, unclear if this was monocular vision loss or, more likely, hemianopsia.  Patient with some numbness on the left side of his face and left foot.  Further examination revealed decreased grip strength in the left upper extremity, ataxia and drift of the left lower extremity.  A code stroke was initiated.  Noncontrast CT did not show any acute abnormality.  Teleneurology has evaluated the patient.  He does have some neurologic deficits, but is not a candidate for thrombolytics because he takes Xarelto secondary to paroxysmal atrial fibrillation.  CT angiography did not show any large vessel occlusion.  Patient recommended for admission for further stroke work-up.  Recommendation is to hold his Xarelto at this time.        Final Clinical Impression(s) / ED Diagnoses Final diagnoses:  Chest pain, unspecified type  Neurological deficit present    Rx / DC Orders ED Discharge Orders     None         Orpah Greek, MD 08/03/21 [id number]

## 2021-08-03 ENCOUNTER — Emergency Department (HOSPITAL_COMMUNITY): Payer: Self-pay

## 2021-08-03 ENCOUNTER — Observation Stay (HOSPITAL_BASED_OUTPATIENT_CLINIC_OR_DEPARTMENT_OTHER): Payer: Self-pay

## 2021-08-03 ENCOUNTER — Other Ambulatory Visit: Payer: Self-pay

## 2021-08-03 ENCOUNTER — Observation Stay (HOSPITAL_COMMUNITY): Payer: Self-pay

## 2021-08-03 DIAGNOSIS — I482 Chronic atrial fibrillation, unspecified: Secondary | ICD-10-CM

## 2021-08-03 DIAGNOSIS — J449 Chronic obstructive pulmonary disease, unspecified: Secondary | ICD-10-CM

## 2021-08-03 DIAGNOSIS — E782 Mixed hyperlipidemia: Secondary | ICD-10-CM

## 2021-08-03 DIAGNOSIS — I10 Essential (primary) hypertension: Secondary | ICD-10-CM

## 2021-08-03 DIAGNOSIS — G43109 Migraine with aura, not intractable, without status migrainosus: Secondary | ICD-10-CM

## 2021-08-03 DIAGNOSIS — E1165 Type 2 diabetes mellitus with hyperglycemia: Secondary | ICD-10-CM

## 2021-08-03 DIAGNOSIS — E876 Hypokalemia: Secondary | ICD-10-CM

## 2021-08-03 DIAGNOSIS — K219 Gastro-esophageal reflux disease without esophagitis: Secondary | ICD-10-CM

## 2021-08-03 DIAGNOSIS — I639 Cerebral infarction, unspecified: Secondary | ICD-10-CM | POA: Diagnosis present

## 2021-08-03 DIAGNOSIS — I6389 Other cerebral infarction: Secondary | ICD-10-CM

## 2021-08-03 LAB — URINALYSIS, ROUTINE W REFLEX MICROSCOPIC
Bilirubin Urine: NEGATIVE
Glucose, UA: NEGATIVE mg/dL
Hgb urine dipstick: NEGATIVE
Ketones, ur: NEGATIVE mg/dL
Leukocytes,Ua: NEGATIVE
Nitrite: NEGATIVE
Protein, ur: NEGATIVE mg/dL
Specific Gravity, Urine: 1.015 (ref 1.005–1.030)
pH: 5.5 (ref 5.0–8.0)

## 2021-08-03 LAB — LIPID PANEL
Cholesterol: 94 mg/dL (ref 0–200)
HDL: 35 mg/dL — ABNORMAL LOW (ref >40)
LDL Cholesterol: 50 mg/dL (ref 0–99)
Total CHOL/HDL Ratio: 2.7 RATIO
Triglycerides: 46 mg/dL (ref <150)
VLDL: 9 mg/dL (ref 0–40)

## 2021-08-03 LAB — RESP PANEL BY RT-PCR (FLU A&B, COVID) ARPGX2
Influenza A by PCR: NEGATIVE
Influenza B by PCR: NEGATIVE
SARS Coronavirus 2 by RT PCR: NEGATIVE

## 2021-08-03 LAB — COMPREHENSIVE METABOLIC PANEL
ALT: 33 U/L (ref 0–44)
AST: 17 U/L (ref 15–41)
Albumin: 4 g/dL (ref 3.5–5.0)
Alkaline Phosphatase: 46 U/L (ref 38–126)
Anion gap: 10 (ref 5–15)
BUN: 19 mg/dL (ref 6–20)
CO2: 23 mmol/L (ref 22–32)
Calcium: 8.6 mg/dL — ABNORMAL LOW (ref 8.9–10.3)
Chloride: 103 mmol/L (ref 98–111)
Creatinine, Ser: 1.05 mg/dL (ref 0.61–1.24)
GFR, Estimated: >60 mL/min (ref >60)
Glucose, Bld: 161 mg/dL — ABNORMAL HIGH (ref 70–99)
Potassium: 3.4 mmol/L — ABNORMAL LOW (ref 3.5–5.1)
Sodium: 136 mmol/L (ref 135–145)
Total Bilirubin: 0.4 mg/dL (ref 0.3–1.2)
Total Protein: 6.8 g/dL (ref 6.5–8.1)

## 2021-08-03 LAB — RAPID URINE DRUG SCREEN, HOSP PERFORMED
Amphetamines: NOT DETECTED
Barbiturates: NOT DETECTED
Benzodiazepines: POSITIVE — AB
Cocaine: NOT DETECTED
Opiates: NOT DETECTED
Tetrahydrocannabinol: NOT DETECTED

## 2021-08-03 LAB — ECHOCARDIOGRAM COMPLETE
Area-P 1/2: 2.56 cm2
Height: 71 in
S' Lateral: 2.8 cm
Weight: 2784 oz

## 2021-08-03 LAB — GLUCOSE, CAPILLARY
Glucose-Capillary: 93 mg/dL (ref 70–99)
Glucose-Capillary: 154 mg/dL — ABNORMAL HIGH (ref 70–99)
Glucose-Capillary: 119 mg/dL — ABNORMAL HIGH (ref 70–99)
Glucose-Capillary: 104 mg/dL — ABNORMAL HIGH (ref 70–99)

## 2021-08-03 LAB — ETHANOL: Alcohol, Ethyl (B): <10 mg/dL (ref <10)

## 2021-08-03 LAB — TROPONIN I (HIGH SENSITIVITY)
Troponin I (High Sensitivity): 4 ng/L (ref <18)
Troponin I (High Sensitivity): <2 ng/L (ref <18)

## 2021-08-03 LAB — HEMOGLOBIN A1C
Hgb A1c MFr Bld: 5.8 % — ABNORMAL HIGH (ref 4.8–5.6)
Mean Plasma Glucose: 119.76 mg/dL

## 2021-08-03 MED ORDER — IOHEXOL 350 MG/ML SOLN
100.0000 mL | Freq: Once | INTRAVENOUS | Status: AC | PRN
  Administered 2021-08-03: 100 mL via INTRAVENOUS

## 2021-08-03 MED ORDER — RIVAROXABAN 20 MG PO TABS
20.0000 mg | ORAL_TABLET | Freq: Every day | ORAL | Status: DC
  Administered 2021-08-03: 20 mg via ORAL
  Filled 2021-08-03: qty 1

## 2021-08-03 MED ORDER — SIMVASTATIN 20 MG PO TABS
20.0000 mg | ORAL_TABLET | Freq: Every day | ORAL | Status: DC
  Administered 2021-08-03: 20 mg via ORAL
  Filled 2021-08-03: qty 1

## 2021-08-03 MED ORDER — LORAZEPAM 2 MG/ML IJ SOLN
0.5000 mg | Freq: Once | INTRAMUSCULAR | Status: AC
  Administered 2021-08-03: 0.5 mg via INTRAVENOUS
  Filled 2021-08-03: qty 1

## 2021-08-03 MED ORDER — INSULIN ASPART 100 UNIT/ML IJ SOLN
0.0000 [IU] | Freq: Three times a day (TID) | INTRAMUSCULAR | Status: DC
  Administered 2021-08-03: 3 [IU] via SUBCUTANEOUS

## 2021-08-03 MED ORDER — ALUM & MAG HYDROXIDE-SIMETH 200-200-20 MG/5ML PO SUSP
30.0000 mL | ORAL | Status: DC | PRN
  Administered 2021-08-03 – 2021-08-04 (×2): 30 mL via ORAL
  Filled 2021-08-03 (×2): qty 30

## 2021-08-03 MED ORDER — INSULIN ASPART 100 UNIT/ML IJ SOLN: 0.0000 [IU] | Freq: Every day | INTRAMUSCULAR | Status: DC

## 2021-08-03 MED ORDER — POTASSIUM CHLORIDE 20 MEQ PO PACK
40.0000 meq | PACK | Freq: Once | ORAL | Status: AC
  Administered 2021-08-03: 40 meq via ORAL
  Filled 2021-08-03: qty 2

## 2021-08-03 NOTE — Consult Note (Signed)
[facility] Telemedicine Consult   Requesting Provider: Dr. Dwyane Dee  Chief Complaint: left sided weakness numbness  HPI:  Patient is a 56 year old male with past medical history of hypertension, hyperlipidemia, diabetes, atrial fibrillation on Xarelto, CAD who was admitted for episode of left eye vision loss, speech difficulty and left sided weakness/numbness.  Per chart, patient was admitted on 08/12/2015 for cardiac cath.  Post procedure, patient had transient symptoms of left eye only left visual field loss, lasted about 10 minutes and resolved.  Then had brief episode of left thumb numbness.  MRI brain and MRA head negative.  Carotid Doppler negative, EF 60 to 65%, cardiac cath showed distal Cx lesion 65%.  In 12/2015 cardiac monitoring showed paroxysmal A. fib RVR.  07/2016 Holter monitoring for 48 hours concerning for a flutter.  09/2020 patient again admitted for episode of right-sided vision loss and headache.  Symptoms resolved on ED arrival.  MRI, MRV, CTA head and neck all unremarkable.  Symptoms concerning for complicated migraine, however patient has no history of migraine so TIA was also in differential at that time.  A1c 6.4, LDL 66.  Patient was continued on Xarelto.  Last night 10PM patient was talking to her daughter over the computer, had onset of chest pain, SOB, diaphoresis with feeling of pass out. He sat down and had slurry speech followed by left eye vision loss, initially grey then became dark. Right eye normal vision. At the same time, he felt throbbing HA. He took Xanax and metoprolol at home. On EMS arrival, he felt left-sided weakness numbness and hard to get into ambulance.  On ED arrival, his symptoms much improved, and then resolved around 2-3 am. Currently he is at his baseline. CT, CT head and neck unremarkable.  MRI this morning also unremarkable, no acute infarct.  EF 60 to 65%.  LDL 50, A1c 5.8.  UDS positive for benzo.  He admitted light smoking, has  quit 3 times but relapsed. He takes Production designer, theatre/television/film everyday.   LKW: 2200 last night tpa given?: No, rapid improvement, on Xarelto  Exam: Vitals:   08/03/21 1045 08/03/21 1251  BP: 121/73 119/69  Pulse: (!) 53 (!) 51  Resp:  17  Temp: 98.3 F (36.8 C) 98.8 F (37.1 C)  SpO2: 97% 99%     Temp:  [97.6 F (36.4 C)-98.8 F (37.1 C)] 98.8 F (37.1 C) (01/18 1251) Pulse Rate:  [47-64] 51 (01/18 1251) Resp:  [10-21] 17 (01/18 1251) BP: (103-138)/(59-83) 119/69 (01/18 1251) SpO2:  [94 %-99 %] 99 % (01/18 1251) Weight:  [78.9 kg] 78.9 kg (01/17 2322)  General - Well nourished, well developed, in no apparent distress.  Ophthalmologic - fundi not visualized due to noncooperation.  Cardiovascular - Regular rhythm and rate.  Neuro - awake, alert, eyes open, orientated to age, place, time and people. No aphasia, fluent language, following all simple commands. Able to name and repeat and read. No gaze palsy, tracking bilaterally, visual field full, PERRL. No facial droop. Tongue midline. Bilateral UEs 5/5, no drift. Bilaterally LEs 5/5, no drift. Sensation symmetrical bilaterally, b/l FTN and HTS intact, gait not tested.    Imaging Reviewed:  MR BRAIN WO CONTRAST  Result Date: 08/03/2021 CLINICAL DATA:  Neuro deficit, acute, stroke suspected. Left-sided weakness/numbness, left eye vision loss, and speech difficulty. EXAM: MRI HEAD WITHOUT CONTRAST TECHNIQUE: Multiplanar, multiecho pulse sequences of the brain and surrounding structures were obtained without intravenous contrast. COMPARISON:  Head CT 08/02/2021 and MRI 10/01/2020 FINDINGS: Brain: There is  no evidence of an acute infarct, intracranial hemorrhage, mass, midline shift, or extra-axial fluid collection. The ventricles and sulci are normal. The brain is normal in signal. Vascular: Major intracranial vascular flow voids are preserved. Skull and upper cervical spine: Unremarkable bone marrow signal para Sinuses/Orbits: Unremarkable orbits.  Mild mucosal thickening in the paranasal sinuses. No significant mastoid fluid. Other: None. IMPRESSION: Unremarkable appearance of the brain. Electronically Signed   By: Logan Bores M.D.   On: 08/03/2021 12:14   DG Chest Port 1 View  Result Date: 08/03/2021 CLINICAL DATA:  Chest pain. EXAM: PORTABLE CHEST 1 VIEW COMPARISON:  Chest radiograph dated 04/18/2021. FINDINGS: No focal consolidation, pleural effusion or pneumothorax. Top-normal cardiac size. No acute osseous pathology. IMPRESSION: No active cardiopulmonary disease. Electronically Signed   By: Anner Crete M.D.   On: 08/03/2021 00:39   ECHOCARDIOGRAM COMPLETE  Result Date: 08/03/2021    ECHOCARDIOGRAM REPORT   Patient Name:   Richard [name] Date of Exam: 08/03/2021 Medical Rec #:  [id number]      Height:       71.0 in Accession #:    [id number]     Weight:       174.0 lb Date of Birth:  Jun 25, 1966      BSA:          1.987 m Patient Age:    41 years       BP:           121/73 mmHg Patient Gender: M              HR:           53 bpm. Exam Location:  [facility] Procedure: 2D Echo, Cardiac Doppler and Color Doppler Indications:    Stroke I63.9  History:        Patient has prior history of Echocardiogram examinations, most                 recent 02/24/2020. CAD, COPD, TIA and Stroke, Arrythmias:Atrial                 Fibrillation; Risk Factors:Hypertension, Dyslipidemia, Current                 Smoker and Diabetes. Arteriosclerotic cardiovascular disease                 (ASCVD).  Sonographer:    Alvino Chapel RCS Referring Phys: [id number] OLADAPO ADEFESO IMPRESSIONS  1. Left ventricular ejection fraction, by estimation, is 60 to 65%. The left ventricle has normal function. The left ventricle has no regional wall motion abnormalities. Left ventricular diastolic parameters were normal.  2. Right ventricular systolic function is normal. The right ventricular size is normal. Tricuspid regurgitation signal is inadequate for assessing PA pressure.  3. The  mitral valve is normal in structure. No evidence of mitral valve regurgitation. No evidence of mitral stenosis.  4. The aortic valve was not well visualized. Aortic valve regurgitation is not visualized. No aortic stenosis is present.  5. The inferior vena cava is dilated in size with >50% respiratory variability, suggesting right atrial pressure of 8 mmHg. FINDINGS  Left Ventricle: Left ventricular ejection fraction, by estimation, is 60 to 65%. The left ventricle has normal function. The left ventricle has no regional wall motion abnormalities. The left ventricular internal cavity size was normal in size. There is  no left ventricular hypertrophy. Left ventricular diastolic parameters were normal. Right Ventricle: The right ventricular size is normal. No increase in right  ventricular wall thickness. Right ventricular systolic function is normal. Tricuspid regurgitation signal is inadequate for assessing PA pressure. Left Atrium: Left atrial size was normal in size. Right Atrium: Right atrial size was normal in size. Pericardium: Trivial pericardial effusion is present. Mitral Valve: The mitral valve is normal in structure. No evidence of mitral valve regurgitation. No evidence of mitral valve stenosis. Tricuspid Valve: The tricuspid valve is normal in structure. Tricuspid valve regurgitation is trivial. Aortic Valve: The aortic valve was not well visualized. Aortic valve regurgitation is not visualized. No aortic stenosis is present. Pulmonic Valve: The pulmonic valve was not well visualized. Pulmonic valve regurgitation is not visualized. Aorta: The aortic root is normal in size and structure. Venous: The inferior vena cava is dilated in size with greater than 50% respiratory variability, suggesting right atrial pressure of 8 mmHg. IAS/Shunts: The interatrial septum was not well visualized.  LEFT VENTRICLE PLAX 2D LVIDd:         4.80 cm   Diastology LVIDs:         2.80 cm   LV e' medial:    9.46 cm/s LV PW:          1.00 cm   LV E/e' medial:  10.3 LV IVS:        1.00 cm   LV e' lateral:   15.70 cm/s LVOT diam:     1.70 cm   LV E/e' lateral: 6.2 LV SV:         64 LV SV Index:   32 LVOT Area:     2.27 cm  RIGHT VENTRICLE RV S prime:     13.30 cm/s TAPSE (M-mode): 2.4 cm LEFT ATRIUM             Index        RIGHT ATRIUM           Index LA diam:        3.50 cm 1.76 cm/m   RA Area:     17.40 cm LA Vol (A2C):   48.3 ml 24.30 ml/m  RA Volume:   49.20 ml  24.76 ml/m LA Vol (A4C):   49.8 ml 25.06 ml/m LA Biplane Vol: 53.0 ml 26.67 ml/m  AORTIC VALVE LVOT Vmax:   135.00 cm/s LVOT Vmean:  75.700 cm/s LVOT VTI:    0.284 m  AORTA Ao Root diam: 3.00 cm MITRAL VALVE MV Area (PHT): 2.56 cm    SHUNTS MV Decel Time: 296 msec    Systemic VTI:  0.28 m MV E velocity: 97.10 cm/s  Systemic Diam: 1.70 cm MV A velocity: 76.40 cm/s MV E/A ratio:  1.27 Oswaldo Milian MD Electronically signed by Oswaldo Milian MD Signature Date/Time: 08/03/2021/2:51:16 PM    Final    CT HEAD CODE STROKE WO CONTRAST  Result Date: 08/02/2021 CLINICAL DATA:  Code stroke. Initial evaluation for acute left-sided weakness. EXAM: CT HEAD WITHOUT CONTRAST TECHNIQUE: Contiguous axial images were obtained from the base of the skull through the vertex without intravenous contrast. RADIATION DOSE REDUCTION: This exam was performed according to the departmental dose-optimization program which includes automated exposure control, adjustment of the mA and/or kV according to patient size and/or use of iterative reconstruction technique. COMPARISON:  Prior MRI from 10/01/2020 FINDINGS: Brain: Cerebral volume within normal limits. No acute intracranial hemorrhage. No acute large vessel territory infarct. No mass lesion, midline shift or mass effect. No hydrocephalus or extra-axial fluid collection. Vascular: No hyperdense vessel. Skull: Scalp soft tissues and calvarium within normal limits.  Sinuses/Orbits: Globes and orbital soft tissues demonstrate no acute  finding. Mild scattered mucosal thickening noted within the ethmoidal air cells. Paranasal sinuses are otherwise clear. No mastoid effusion. Other: None. ASPECTS [facility] Stroke Program Early CT Score) - Ganglionic level infarction (caudate, lentiform nuclei, internal capsule, insula, M1-M3 cortex): 7. - Supraganglionic infarction (M4-M6 cortex): 3 Total score (0-10 with 10 being normal): 10 IMPRESSION: 1. Negative head CT.  No acute intracranial abnormality. 2. ASPECTS is 10. Critical Value/emergent results were called by telephone at the time of interpretation on 08/02/2021 at 11:48 pm to provider [facility] , who verbally acknowledged these results. Electronically Signed   By: Jeannine Boga M.D.   On: 08/02/2021 23:51   CT ANGIO HEAD NECK W WO CM (CODE STROKE)  Result Date: 08/03/2021 CLINICAL DATA:  Initial evaluation for neuro deficit, left-sided weakness and visual disturbance. EXAM: CT ANGIOGRAPHY HEAD AND NECK TECHNIQUE: Multidetector CT imaging of the head and neck was performed using the standard protocol during bolus administration of intravenous contrast. Multiplanar CT image reconstructions and MIPs were obtained to evaluate the vascular anatomy. Carotid stenosis measurements (when applicable) are obtained utilizing NASCET criteria, using the distal internal carotid diameter as the denominator. RADIATION DOSE REDUCTION: This exam was performed according to the departmental dose-optimization program which includes automated exposure control, adjustment of the mA and/or kV according to patient size and/or use of iterative reconstruction technique. CONTRAST:  19mL OMNIPAQUE IOHEXOL 350 MG/ML SOLN COMPARISON:  Prior CT from 08/02/2021. FINDINGS: CTA NECK FINDINGS Aortic arch: Visualized aortic arch normal caliber with normal branch pattern. No stenosis or other abnormality about the origin of the great vessels. Right carotid system: Right common and internal carotid arteries widely  patent without stenosis, dissection or occlusion. Left carotid system: Left common and internal carotid arteries widely patent without stenosis, dissection or occlusion. Vertebral arteries: Both vertebral arteries arise from the subclavian arteries. No proximal subclavian artery stenosis. Both vertebral arteries widely patent without stenosis, dissection or occlusion. Skeleton: No discrete or worrisome osseous lesions. Mild-to-moderate spondylosis present at C4-5 through C6-7. Other neck: No other acute soft tissue abnormality within the neck. Upper chest: Visualized upper chest demonstrates no acute finding. Review of the MIP images confirms the above findings CTA HEAD FINDINGS Anterior circulation: Petrous segments patent bilaterally. Mild for age atheromatous change within the carotid siphons without significant stenosis. A1 segments patent bilaterally. Left A1 hypoplastic. Normal anterior communicating artery complex. Anterior cerebral arteries patent without stenosis. No M1 stenosis or occlusion. Normal MCA bifurcations. Distal MCA branches perfused and symmetric. Posterior circulation: Both vertebral arteries patent to the vertebrobasilar junction without stenosis. Right vertebral artery dominant. Both PICA origins patent and normal. Basilar patent to its distal aspect without stenosis. Superior cerebellar and posterior cerebral arteries widely patent bilaterally. Venous sinuses: Grossly patent allowing for timing the contrast bolus. Anatomic variants: None significant.  No aneurysm. Review of the MIP images confirms the above findings IMPRESSION: Normal CTA of the head and neck. No large vessel occlusion, hemodynamically significant stenosis, or other acute vascular abnormality. Electronically Signed   By: Jeannine Boga M.D.   On: 08/03/2021 01:16     Labs reviewed in epic and pertinent values follow:  Latest Reference Range & Units 08/03/21 01:29 08/03/21 09:54  Total CHOL/HDL Ratio RATIO  2.7   Cholesterol 0 - 200 mg/dL  94  HDL Cholesterol >40 mg/dL  35 (L)  LDL (calc) 0 - 99 mg/dL  50  Triglycerides <150 mg/dL  46  VLDL 0 -  40 mg/dL  9  Hemoglobin A1C 4.8 - 5.6 %  5.8 (H)  Amphetamines NONE DETECTED  NONE DETECTED   Barbiturates NONE DETECTED  NONE DETECTED   Benzodiazepines NONE DETECTED  POSITIVE !   Opiates NONE DETECTED  NONE DETECTED   COCAINE NONE DETECTED  NONE DETECTED   Tetrahydrocannabinol NONE DETECTED  NONE DETECTED     Assessment:  Patient is a 56 year old male with past medical history of hypertension, hyperlipidemia, diabetes, atrial fibrillation on Xarelto, CAD who was admitted for episode of CP, SOB, left eye vision disturbance, speech difficulty and left sided weakness/numbness. Symptoms now all resolved and work up including CT, CTA head and neck, MRI and 2D echo all unremarkable. LDL 50, A1c 5.8.   Pt had left eye only left visual field loss in 07/2015 post cardiac cath procedure. Had episode of right-sided vision loss and headache in 09/2020. Both times work up with MRI, MRA, MRV, CTA head and neck, CUS all negative.   Given the multiple episodes with transient neuro deficit with or without HA, work up over time all negative, concerning for complicated migraine although no significant hx of migraine. TIAs are in the DDx since pt does have stroke risk factors but seems less likely at this time given recurrent episodes without imaging finding. Will continue Xarelto and statin. Will refer to outpt neurology follow up for possible complicated migraine.   Recommendations:  Continue Xarelto and statin home meds BP goal normotensive.  Work up for CP, SOB per primary team Stroke risk factor modification  Follow up with cardiology for afib and CAD He has appointment with Dr. Krista Blue at [facility] 08/25/2021, will have her for second opinion Will sign off, please call with any questions.     Consult Participants: pt, RN, me Location of the provider: Home Location of the  patient: [facility]  This consult was provided via telemedicine with 2-way video and audio communication. The patient/family was informed that care would be provided in this way and agreed to receive care in this manner.   This patient is receiving care for possible acute neurological changes. There was 60 minutes of care by this provider at the time of service, including time for direct evaluation via telemedicine, review of medical records, imaging studies and discussion of findings with providers, the patient and/or family.  Rosalin Hawking, MD PhD Stroke Neurology 08/03/2021 2:27 PM

## 2021-08-03 NOTE — Progress Notes (Signed)
SLP Cancellation Note  Patient Details Name: DIVINE HANSLEY MRN: [id number] DOB: 05-Jan-1966   Cancelled treatment:       Reason Eval/Treat Not Completed: Other (comment) (Pt passed [facility] swallow screen and has been started on a diet. SLP will d/c order. If MD would like cognitive linguistic evaluation, please order.)  Thank you,  Genene Churn, [place] 08/03/2021, 11:48 AM

## 2021-08-03 NOTE — Consult Note (Signed)
[facility] TeleNeurology Consult Services   Patient Name:   Richard [name], Richard [name] Date of Birth:   1965/07/21 Identification Number:   MRN - [id number] Date of Service:   08/02/2021 23:40:39  Diagnosis:       R53.1 - Weakness       R20.2 - Paresthesia of skin       H53.8 - Blurred Vision  Impression:      Patient is a 56 year old male with past medical history of hypertension, hyperlipidemia, diabetes, prior TIAs, atrial fibrillation on Xarelto, coronary artery disease who presented in the setting of left eye vision loss, speech difficulty and left sided weakness/numbness - improving. LKN 2220 on 08/02/2021.    Briefly, patient had onset of chest pain followed by left eye vision loss, speech difficulties, and left-sided weakness/numbness earlier this evening. Since onset, his vision has improved and is almost back to baseline. His numbness/weakness has also improved, but still remains. And lastly, his speech difficulties have fully resolved back to baseline. At the time of evaluation NIHSS of 2 for left leg drift and numbness to the left face. CT head was without any acute findings. Not a candidate for thrombolytics given use of Xarelto within the past 48 hours. CTA head/neck without large vessel occlusion or significant stenosis.    Overall, presentation is most concerning for possible ischemic stroke versus what may be a resolving TIA event. Would recommend admission with the following.    -If symptoms worsen/change, please reach back out for re-evaluation  -Chest pain eval per ED team  -MRI brain w/o contrast  -Vessel imaging completed per above  -ECHO w/ bubble, if possible  -Check A1c and Lipid panel (LDL goal of <70)  -Antiplatelet/AC plan: Hold home anticoagulation for now pending MRI  -Statin plan: Continue home statin  -Telemetry  -Neuro Checks Q2-4hr for first 24 hr, then unit routine/protocol  -Permissive HTN up to SBP 232mmHg for the first 24 hours, followed by  195mmHg after  -Bedside swallow eval. If fails, please place patient NPO until evaluated by speech therapy  -PT/OT/SLP as indicated  -DVT ppx: SCDs for now  -Recommend neurology follow-up with admission    Metrics: Last Known Well: 08/02/2021 22:00:00 [facility] Notification Time: 08/02/2021 23:40:38 Arrival Time: 08/02/2021 23:14:00 Stamp Time: 08/02/2021 23:40:39 Initial Response Time: 08/02/2021 23:45:52 Symptoms: left eye vision loss, speech difficulty and left sided weakness/numbness . NIHSS Start Assessment Time: 08/02/2021 23:45:00 Patient is not a candidate for Thrombolytic. Thrombolytic Medical Decision: 08/02/2021 23:48:27 Patient was not deemed candidate for Thrombolytic because of following reasons: Use of NOAs within 48 hours.  CT head showed no acute hemorrhage or acute core infarct.  ED Physician notified of diagnostic impression and management plan on 08/03/2021 01:33:16  Advanced Imaging: CTA Head and Neck Completed.  LVO:No  Patient doesn't meet criteria for emergent NIR consideration     ------------------------------------------------------------------------------  History of Present Illness: Patient is a 56 year old Male.  Patient was brought by EMS for symptoms of left eye vision loss, speech difficulty and left sided weakness/numbness . Patient is a 56 year old male with past medical history of hypertension, hyperlipidemia, diabetes, prior TIAs, atrial fibrillation on Xarelto, coronary artery disease who presented in the setting of left eye vision loss, speech difficulty and left sided weakness/numbness - improving. LKN 2220 on 08/02/2021.  Discussing with patient, he states that around 2200 on 1/17 he began to have substernal chest pain with shortness of breath. Approximately 20 minutes after that he had acute onset of diffuse vision  loss to his left eye only in addition to left-sided weakness and left-sided numbness. He states that he last took  his Xarelto the night prior. EMS was called to scene, and upon their arrival he states that his vision began to improve and is almost back at baseline. With both eyes open, he does not notice any visual deficits, but when he books out from his left eye he notices some diffuse fuzziness. Patient additionally states that his weakness and numbness have also improved, but still remain. He goes on to note that he had some speech difficulty when this initially started, but this is fully resolved.    Past Medical History: O[place] PMH:  per HPI  Medications:  Anticoagulant use:  Yes Xarelto No Antiplatelet use Reviewed EMR for current medications  Allergies:  Reviewed  Social History: Smoking: Yes Alcohol Use: No Drug Use: No  Family History:  There Is Family History [id number] There is no family history of premature cerebrovascular disease pertinent to this consultation  ROS : 14 Points Review of Systems was performed and was negative except mentioned in HPI.  Past Surgical History: There Is No Surgical History Contributory To Todays Visit     Examination: BP(123/64), Pulse(62), 1A: Level of Consciousness - Alert; keenly responsive + 0 1B: Ask Month and Age - Both Questions Right + 0 1C: Blink Eyes & Squeeze Hands - Performs Both Tasks + 0 2: Test Horizontal Extraocular Movements - Normal + 0 3: Test Visual Fields - No Visual Loss + 0 4: Test Facial Palsy (Use Grimace if Obtunded) - Normal symmetry + 0 5A: Test Left Arm Motor Drift - No Drift for 10 Seconds + 0 5B: Test Right Arm Motor Drift - No Drift for 10 Seconds + 0 6A: Test Left Leg Motor Drift - Drift, but doesn't hit bed + 1 6B: Test Right Leg Motor Drift - No Drift for 5 Seconds + 0 7: Test Limb Ataxia (FNF/Heel-Shin) - No Ataxia + 0 8: Test Sensation - Mild-Moderate Loss: Less Sharp/More Dull + 1 9: Test Language/Aphasia - Normal; No aphasia + 0 10: Test Dysarthria - Normal + 0 11: Test Extinction/Inattention - No  abnormality + 0  NIHSS Score: 2  NIHSS Free Text : No pronator drift  Reduced on left face  Pre-Morbid Modified Rankin Scale: 0 Points = No symptoms at all   Patient/Family was informed the Neurology Consult would occur via TeleHealth consult by way of interactive audio and video telecommunications and consented to receiving care in this manner.   Patient is being evaluated for possible acute neurologic impairment and high probability of imminent or life-threatening deterioration. I spent total of 49 minutes providing care to this patient, including time for face to face visit via telemedicine, review of medical records, imaging studies and discussion of findings with providers, the patient and/or family.   Dr Gwinda Passe   [facility] [phone]   Case [id number]

## 2021-08-03 NOTE — Telephone Encounter (Signed)
Noted  

## 2021-08-03 NOTE — Care Plan (Addendum)
This 56 years old Male with PMH significant for hypertension, type 2 diabetes, hyperlipidemia, CAD, COPD, GERD, atrial fibrillation on Xarelto presented in the ED via EMS with sudden onset of speech difficulty, left-sided vision loss and weakness and numbness in the left extremities.  EMS was called.  Patient's symptoms has resolved and back to baseline after EMS arrived.  Patient still has some mild left-sided numbness and weakness.  Work-up in the ED showed bradycardia, electrolyte abnormalities, urine drug screen positive for benzodiazepines.  CT head no acute intracranial abnormality.  CTA head and neck is unremarkable.  Teleneurology was consulted,  recommended further stroke work-up. MRI unremarkable, echocardiogram is completed, report is pending.  Continue aspirin and statins.  PT and OT and speech eval.  Patient was seen and examined at bedside.  He reported symptoms has resolved.

## 2021-08-03 NOTE — H&P (Signed)
History and Physical  Richard [name]:[id number] DOB: 17-Sep-1965 DOA: 08/02/2021  Referring physician: Orpah Greek, MD PCP: [name], PA-C  Patient coming from: Home  Chief Complaint: Chest pain  HPI: Richard [name] is a 56 y.o. male with medical history significant for hypertension, T2DM, hyperlipidemia, CAD, COPD, hypertension, GERD, atrial fibrillation on Xarelto who presents to the emergency department via EMS due to sudden onset speech difficulty, left-sided vision loss and weakness/numbness in left extremities.  Patient states that symptoms started at 10:20 PM yesterday (08/02/2021).  He complained of chest pain which started while he was just sitting on the couch, this was followed by loss of vision on the left eye, he got up from the couch to let his wife know what was happening to him and realized that he was having difficulty in being able to get his words out, he thought that he was having a stroke since he has positive family history of stroke in his dad and his dad's family, EMS was activated.  Patient's left eye vision, speech difficulty, chest pain resolved back to baseline.  Left-sided numbness/weakness have improved, but, has not completely resolved.  He denies fever, chills, nausea, vomiting, abdominal pain, constipation or diarrhea  ED Course:  In the emergency department, he was bradycardic.  Work-up in the ED showed normal CBC, hypokalemia, hyperglycemia, urine drug screen was positive for benzodiazepine, troponin x2 was normal, urinalysis was normal.  Influenza A, B, SARS coronavirus 2 was negative. CT head without contrast showed no acute intracranial abnormality CT angiography head and neck showed normal CTA of the head and neck.  No large vessel occlusion Chest x-ray showed no active cardiopulmonary disease Teleneurology was consulted and recommended further stroke work-up.  Review of Systems: A full 10 point Review of Systems was done, except as  stated above, all other Review of systems were negative.  Past Medical History:  Diagnosis Date   Arthritis    Asthma    CAD (coronary artery disease)    a. Cardiac cath 07/2015 showed 65% distal Cx, 20% mid-distal LAD, 20% prox-distal RCA, EF 60%, EDP 52mmHg.   Colitis 1990   COPD (chronic obstructive pulmonary disease) ([place])    Depression    Essential hypertension    Gastric ulcer 2003; 2012   2003: + esophagitis; negative H.pylori serology  2012: Dr. Oneida Alar, mild gastritis, Bravo PH probe placement, negative H.pylori   GERD (gastroesophageal reflux disease)    Hepatic steatosis    History of hiatal hernia    Hyperlipemia    Overweight    Panic attacks    Paroxysmal atrial fibrillation ([place])    Stroke ([place])    TIA (transient ischemic attack)    Type II diabetes mellitus ([place])    Past Surgical History:  Procedure Laterality Date   BALLOON DILATION N/A 07/25/2021   Procedure: BALLOON DILATION;  Surgeon: Eloise Harman, DO;  [facility];  Service: Endoscopy;  Laterality: N/A;   BIOPSY  07/25/2021   Procedure: BIOPSY;  Surgeon: Eloise Harman, DO;  [facility];  Service: Endoscopy;;   BRAVO [place] STUDY  05/03/2011   XHB:ZJIR gastritis/normal esophagus and duodenum   CARDIAC CATHETERIZATION  1990s X 1; 2005; 08/12/2015   CARDIAC CATHETERIZATION N/A 08/12/2015   Procedure: Left Heart Cath and Coronary Angiography;  Surgeon: Belva Crome, MD; LAD 20%, CFX 65%, RCA 20%, EF 60%    COLONOSCOPY  1990   COLONOSCOPY WITH PROPOFOL N/A 11/21/2016   Dr.  Fields: non-thrombosed external hemorrhoids, one 6 mm polyp (polypoid lesion), internal hemorrhoids. TI Normal. 10 years screening   ESOPHAGOGASTRODUODENOSCOPY  05/03/2011   GLO:VFIE gastritis   ESOPHAGOGASTRODUODENOSCOPY (EGD) WITH PROPOFOL N/A 07/25/2021   Procedure: ESOPHAGOGASTRODUODENOSCOPY (EGD) WITH PROPOFOL;  Surgeon: Eloise Harman, DO;  [facility];  Service: Endoscopy;  Laterality: N/A;  1:30pm    NECK MASS EXCISION Right    "done in dr's office; behind right ear/side of ncek"   POLYPECTOMY  11/21/2016   Procedure: POLYPECTOMY;  Surgeon: [name], MD;  Location[facility];  Service: Endoscopy;;  descending colon polyp   SHOULDER ARTHROSCOPY W/ ROTATOR CUFF REPAIR Right 2006   acromioclavicular joint arthrosis    Social History:  reports that he has been smoking cigarettes. He started smoking about 39 years ago. He has a 6.25 pack-year smoking history. He has never used smokeless tobacco. He reports that he does not drink alcohol and does not use drugs.   Allergies  Allergen Reactions   Dexilant [Dexlansoprazole] Anaphylaxis   Mushroom Ext Cmplx(Shiitake-Reishi-Mait) Anaphylaxis    Rapid heart rate.   Penicillins Anaphylaxis    Has patient had a PCN reaction causing immediate rash, facial/tongue/throat swelling, SOB or lightheadedness with hypotension: Yes Has patient had a PCN reaction causing severe rash involving mucus membranes or skin necrosis: No Has patient had a PCN reaction that required hospitalization Yes Has patient had a PCN reaction occurring within the last 10 years: No If all of the above answers are "NO", then may proceed with Cephalosporin use.    Doxycycline Nausea And Vomiting         Family History  Problem Relation Age of Onset   Lung cancer Mother    Alcohol abuse Mother    Heart attack Father 4   Diabetes Father    Alcohol abuse Father    Hypertension Brother    Hypertension Brother    Anxiety disorder Sister    Depression Sister    Anxiety disorder Sister    Heart attack Brother 28   Diabetes Brother    Hypertension Brother    Seizures Brother    Dementia Paternal Uncle    Dementia Cousin    ADD / ADHD Daughter    Colon cancer Neg Hx    Drug abuse Neg Hx    Bipolar disorder Neg Hx    OCD Neg Hx    Paranoid behavior Neg Hx    Schizophrenia Neg Hx    Sexual abuse Neg Hx    Physical abuse Neg Hx      Prior to Admission  medications   Medication Sig Start Date End Date Taking? Authorizing Provider  albuterol (VENTOLIN HFA) 108 (90 Base) MCG/ACT inhaler INHALE 2 PUFFS BY MOUTH EVERY 6 HOURS AS NEEDED FOR COUGHING, WHEEZING, OR SHORTNESS OF BREATH 07/25/21   [name], PA-C  ALPRAZolam Duanne Moron) 0.5 MG tablet TAKE 1 TABLET BY MOUTH THREE TIMES DAILY AND  2  TABLETS  AT  NIGHT 06/16/21   Cloria Spring, MD  aluminum-magnesium hydroxide 200-200 MG/5ML suspension Take by mouth every 6 (six) hours as needed for indigestion.    [provider]  amLODipine (NORVASC) 10 MG tablet TAKE 1 Tablet BY MOUTH ONCE DAILY DOSE CHANGE 07/21/21   Satira Sark, MD  bisacodyl (BISACODYL LAXATIVE) 10 MG suppository Place 1 suppository (10 mg total) rectally as needed for moderate constipation. 04/07/21   Maudie Flakes, MD  dronedarone (MULTAQ) 400 MG tablet Take 1  tablet (400 mg total) by mouth 2 (two) times daily with a meal. 05/10/21   Evans Lance, MD  esomeprazole (NEXIUM) 40 MG capsule Take 1 capsule (40 mg total) by mouth 2 (two) times daily before a meal. Patient taking differently: Take 40 mg by mouth daily. 06/03/20   Annitta Needs, NP  hydrocortisone (ANUSOL-HC) 2.5 % rectal cream Place 1 application rectally 2 (two) times daily. Patient taking differently: Place 1 application rectally daily as needed for hemorrhoids. 04/04/21   Erenest Rasher, PA-C  JANUVIA 100 MG tablet TAKE 1 Tablet BY MOUTH ONCE DAILY 07/25/21   [name], PA-C  lidocaine (XYLOCAINE) 2 % solution Use as directed 15 mLs in the mouth or throat every 3 (three) hours as needed for mouth pain (gargle swish and spit  as needed for dental pain). 01/22/21   Scot Jun, FNP  metoprolol succinate (TOPROL XL) 25 MG 24 hr tablet Take 1 tablet (25 mg total) by mouth daily. 06/21/21   Evans Lance, MD  nitroGLYCERIN (NITROSTAT) 0.4 MG SL tablet Place 1 tablet (0.4 mg total) under the tongue every 5 (five) minutes x 3 doses as needed for  chest pain (if no relief after 3rd dose, proceed to the ED for an evaluation). 04/20/21   Allred, Jeneen Rinks, MD  OVER THE COUNTER MEDICATION Fiber gummy once a day.    [provider]  polyethylene glycol (MIRALAX / GLYCOLAX) 17 g packet Take 17 g by mouth daily as needed. 04/05/21   Davonna Belling, MD  simvastatin (ZOCOR) 20 MG tablet TAKE 1 TABLET BY MOUTH AT BEDTIME 07/04/21   [name], PA-C  trolamine salicylate (ASPERCREME) 10 % cream Apply 1 application topically 2 (two) times daily as needed for muscle pain.    [provider]  XARELTO 20 MG TABS tablet TAKE 1 Tablet BY MOUTH ONCE EVERY DAY WITH SUPPER 07/21/21   Satira Sark, MD    Physical Exam: BP 112/71 (BP Location: Left Arm)    Pulse (!) 53    Temp 97.9 F (36.6 C) (Oral)    Resp 18    Ht 5\' 11"  (1.803 m)    Wt 78.9 kg    SpO2 97%    BMI 24.27 kg/m   General: 56 y.o. year-old male well developed well nourished in no acute distress.  Alert and oriented x3. HEENT: NCAT, EOMI Neck: Supple, trachea medial Cardiovascular: Regular rate and rhythm with no rubs or gallops.  No thyromegaly or JVD noted.  No lower extremity edema. 2/4 pulses in all 4 extremities. Respiratory: Clear to auscultation with no wheezes or rales. Good inspiratory effort. Abdomen: Soft, nontender nondistended with normal bowel sounds x4 quadrants. Muskuloskeletal: No cyanosis, clubbing or edema noted bilaterally Neuro: CN II-XII intact, strength 5/5 x 4, sensation, reflexes intact Skin: No ulcerative lesions noted or rashes Psychiatry: Judgement and insight appear normal. Mood is appropriate for condition and setting          Labs on Admission:  Basic Metabolic Panel: Recent Labs  Lab 08/02/21 2343 08/02/21 2346  NA 136 139  K 3.4* 3.4*  CL 103 101  CO2 23  --   GLUCOSE 161* 159*  BUN 19 18  CREATININE 1.05 1.10  CALCIUM 8.6*  --    Liver Function Tests: Recent Labs  Lab 08/02/21 2343  AST 17  ALT 33  ALKPHOS 46   BILITOT 0.4  PROT 6.8  ALBUMIN 4.0   No results for input(s): LIPASE,  AMYLASE in the last 168 hours. No results for input(s): AMMONIA in the last 168 hours. CBC: Recent Labs  Lab 08/02/21 2343 08/02/21 2346  WBC 7.7  --   NEUTROABS 5.2  --   HGB 14.5 13.9  HCT 41.5 41.0  MCV 92.0  --   PLT 179  --    Cardiac Enzymes: No results for input(s): CKTOTAL, CKMB, CKMBINDEX, TROPONINI in the last 168 hours.  BNP (last 3 results) No results for input(s): BNP in the last 8760 hours.  ProBNP (last 3 results) No results for input(s): PROBNP in the last 8760 hours.  CBG: Recent Labs  Lab 08/02/21 2323 08/03/21 0857  GLUCAP 160* 93    Radiological Exams on Admission: DG Chest Port 1 View  Result Date: 08/03/2021 CLINICAL DATA:  Chest pain. EXAM: PORTABLE CHEST 1 VIEW COMPARISON:  Chest radiograph dated 04/18/2021. FINDINGS: No focal consolidation, pleural effusion or pneumothorax. Top-normal cardiac size. No acute osseous pathology. IMPRESSION: No active cardiopulmonary disease. Electronically Signed   By: Anner Crete M.D.   On: 08/03/2021 00:39   CT HEAD CODE STROKE WO CONTRAST  Result Date: 08/02/2021 CLINICAL DATA:  Code stroke. Initial evaluation for acute left-sided weakness. EXAM: CT HEAD WITHOUT CONTRAST TECHNIQUE: Contiguous axial images were obtained from the base of the skull through the vertex without intravenous contrast. RADIATION DOSE REDUCTION: This exam was performed according to the departmental dose-optimization program which includes automated exposure control, adjustment of the mA and/or kV according to patient size and/or use of iterative reconstruction technique. COMPARISON:  Prior MRI from 10/01/2020 FINDINGS: Brain: Cerebral volume within normal limits. No acute intracranial hemorrhage. No acute large vessel territory infarct. No mass lesion, midline shift or mass effect. No hydrocephalus or extra-axial fluid collection. Vascular: No hyperdense vessel.  Skull: Scalp soft tissues and calvarium within normal limits. Sinuses/Orbits: Globes and orbital soft tissues demonstrate no acute finding. Mild scattered mucosal thickening noted within the ethmoidal air cells. Paranasal sinuses are otherwise clear. No mastoid effusion. Other: None. ASPECTS [facility] Stroke Program Early CT Score) - Ganglionic level infarction (caudate, lentiform nuclei, internal capsule, insula, M1-M3 cortex): 7. - Supraganglionic infarction (M4-M6 cortex): 3 Total score (0-10 with 10 being normal): 10 IMPRESSION: 1. Negative head CT.  No acute intracranial abnormality. 2. ASPECTS is 10. Critical Value/emergent results were called by telephone at the time of interpretation on 08/02/2021 at 11:48 pm to provider [facility] , who verbally acknowledged these results. Electronically Signed   By: Jeannine Boga M.D.   On: 08/02/2021 23:51   CT ANGIO HEAD NECK W WO CM (CODE STROKE)  Result Date: 08/03/2021 CLINICAL DATA:  Initial evaluation for neuro deficit, left-sided weakness and visual disturbance. EXAM: CT ANGIOGRAPHY HEAD AND NECK TECHNIQUE: Multidetector CT imaging of the head and neck was performed using the standard protocol during bolus administration of intravenous contrast. Multiplanar CT image reconstructions and MIPs were obtained to evaluate the vascular anatomy. Carotid stenosis measurements (when applicable) are obtained utilizing NASCET criteria, using the distal internal carotid diameter as the denominator. RADIATION DOSE REDUCTION: This exam was performed according to the departmental dose-optimization program which includes automated exposure control, adjustment of the mA and/or kV according to patient size and/or use of iterative reconstruction technique. CONTRAST:  155mL OMNIPAQUE IOHEXOL 350 MG/ML SOLN COMPARISON:  Prior CT from 08/02/2021. FINDINGS: CTA NECK FINDINGS Aortic arch: Visualized aortic arch normal caliber with normal branch pattern. No stenosis or  other abnormality about the origin of the great vessels. Right carotid system:  Right common and internal carotid arteries widely patent without stenosis, dissection or occlusion. Left carotid system: Left common and internal carotid arteries widely patent without stenosis, dissection or occlusion. Vertebral arteries: Both vertebral arteries arise from the subclavian arteries. No proximal subclavian artery stenosis. Both vertebral arteries widely patent without stenosis, dissection or occlusion. Skeleton: No discrete or worrisome osseous lesions. Mild-to-moderate spondylosis present at C4-5 through C6-7. Other neck: No other acute soft tissue abnormality within the neck. Upper chest: Visualized upper chest demonstrates no acute finding. Review of the MIP images confirms the above findings CTA HEAD FINDINGS Anterior circulation: Petrous segments patent bilaterally. Mild for age atheromatous change within the carotid siphons without significant stenosis. A1 segments patent bilaterally. Left A1 hypoplastic. Normal anterior communicating artery complex. Anterior cerebral arteries patent without stenosis. No M1 stenosis or occlusion. Normal MCA bifurcations. Distal MCA branches perfused and symmetric. Posterior circulation: Both vertebral arteries patent to the vertebrobasilar junction without stenosis. Right vertebral artery dominant. Both PICA origins patent and normal. Basilar patent to its distal aspect without stenosis. Superior cerebellar and posterior cerebral arteries widely patent bilaterally. Venous sinuses: Grossly patent allowing for timing the contrast bolus. Anatomic variants: None significant.  No aneurysm. Review of the MIP images confirms the above findings IMPRESSION: Normal CTA of the head and neck. No large vessel occlusion, hemodynamically significant stenosis, or other acute vascular abnormality. Electronically Signed   By: Jeannine Boga M.D.   On: 08/03/2021 01:16    EKG: I independently  viewed the EKG done and my findings are as followed: Sinus bradycardia at a rate of 58 bpm with APCs  Assessment/Plan Present on Admission:  Acute ischemic stroke ([place])  GERD (gastroesophageal reflux disease)  Essential hypertension  Principal Problem:   Acute ischemic stroke ([place]) Active Problems:   Mixed hyperlipidemia   GERD (gastroesophageal reflux disease)   COPD (chronic obstructive pulmonary disease) ([place])   Essential hypertension   Hypokalemia   Hyperglycemia due to diabetes mellitus ([facility])   Atrial fibrillation, chronic ([facility])  Left-sided numbness/weakness, rule out acute ischemic stroke Patient will be admitted to telemetry unit  CT head without contrast showed no acute intracranial abnormality CT angiography head and neck showed normal CTA of the head and neck.  No large vessel occlusion Echocardiogram in the morning MRI of brain without contrast in the morning Continue aspirin and statin Continue fall precautions and neuro checks Lipid panel and hemoglobin A1c will be checked Continue PT/SLP/OT eval and treat Bedside swallow eval by nursing prior to diet Teleneurology was consulted  status post imaging studies for further recommendation  Atypical chest pain-resolved Troponin x2 was negative Continue telemetry Echocardiogram pending Consider cardiology consult based on echo findings and clinical symptoms  Essential hypertension  Antihypertensives PRN if Blood pressure is greater than 220/120 or there is a concern for End organ damage/contraindications for permissive HTN. If blood pressure is greater than 220/120 give labetalol PO or IV or Vasotec IV with a goal of 15% reduction in BP during the first 24 hours.  Hypokalemia K+ 3.4; this will be replenished  Hyperglycemia secondary to T2DM Continue ISS and hypoglycemic protocol  Chronic atrial fibrillation Patient is currently bradycardic, metoprolol will be held at this time Xarelto will be held pending MRI of  brain  GERD Continue PPI  Mixed hyperlipidemia Continue Zocor  COPD Continue Ventolin  CAD Continue Zocor Nitroglycerin will be held at this time   DVT prophylaxis: SCDs  Code Status: Full code  Family Communication: None at bedside  Disposition  Plan:  Patient is from:                        home Anticipated DC to:                   SNF or family members home Anticipated DC date:               2-3 days Anticipated DC barriers:          Patient requires inpatient management due to pending further stroke work-up  Consults called: Teleneurology  Admission status: Observation    Bernadette Hoit MD [facility] 08/03/2021, 9:29 AM

## 2021-08-03 NOTE — Progress Notes (Signed)
*  PRELIMINARY RESULTS* Echocardiogram 2D Echocardiogram has been performed.  Richard [name] 08/03/2021, 1:01 PM

## 2021-08-03 NOTE — Progress Notes (Signed)
°  Transition of Care (TOC) Screening Note   Patient Details  Name: Richard [name] Date of Birth: 10/31/1965   Transition of Care [facility]) CM/SW Contact:    Boneta Lucks, RN Phone Number: 08/03/2021, 11:05 AM    Transition of Care Department [facility]) has reviewed patient and no TOC needs have been identified at this time. We will continue to monitor patient advancement through interdisciplinary progression rounds. If new patient transition needs arise, please place a TOC consult.

## 2021-08-03 NOTE — Telephone Encounter (Signed)
Patient is currently in hospital. Please have him call when he is discharged regarding abdominal pain. This may be addressed while inpatient.

## 2021-08-03 NOTE — Evaluation (Signed)
Occupational Therapy Evaluation Patient Details Name: Richard [name] MRN: [id number] DOB: 1965-08-13 Today's Date: 08/03/2021   History of Present Illness [name] is a 56 y.o. male with medical history significant for hypertension, T2DM, hyperlipidemia, CAD, COPD, hypertension, GERD, atrial fibrillation on Xarelto who presents to the emergency department via EMS due to sudden onset speech difficulty, left-sided vision loss and weakness/numbness in left extremities.  Patient states that symptoms started at 10:20 PM yesterday (08/02/2021).  He complained of chest pain which started while he was just sitting on the couch, this was followed by loss of vision on the left eye, he got up from the couch to let his wife know what was happening to him and realized that he was having difficulty in being able to get his words out, he thought that he was having a stroke since he has positive family history of stroke in his dad and his dad's family, EMS was activated.  Patient's left eye vision, speech difficulty, chest pain resolved back to baseline.  Left-sided numbness/weakness have improved, but, has not completely resolved.  He denies fever, chills, nausea, vomiting, abdominal pain, constipation or diarrhea   Clinical Impression   Pt agreeable to OT/PT co-evaluation. Pt reporting that vision has largely returned with pt still seeing a black dot at times when looking up. Pt demonstrated bed and functional mobility with independence and mild deficits in balance. Pt reports that L LE and UE still feel "weird" but they are improving. Pt demonstrates mild L shoulder weakness leading to recommendation of outpatient OT if pt does not return to full strength and desires therapy. Pt is not recommended for further acute OT services and will be discharged to care of nursing staff for remaining length of stay.      Recommendations for follow up therapy are one component of a multi-disciplinary discharge planning  process, led by the attending physician.  Recommendations may be updated based on patient status, additional functional criteria and insurance authorization.   Follow Up Recommendations  Outpatient OT    Assistance Recommended at Discharge None        Functional Status Assessment  Patient has had a recent decline in their functional status and demonstrates the ability to make significant improvements in function in a reasonable and predictable amount of time.  Equipment Recommendations  None recommended by OT    Recommendations for Other Services       Precautions / Restrictions Precautions Precautions: Fall Restrictions Weight Bearing Restrictions: No      Mobility Bed Mobility Overal bed mobility: Independent                  Transfers Overall transfer level: Independent                 General transfer comment: Pt able to go from bed to chair with functional ambulation in room and hall.      Balance Overall balance assessment: Mild deficits observed, not formally tested                                         ADL either performed or assessed with clinical judgement   ADL Overall ADL's : Independent  Vision Baseline Vision/History: 1 Wears glasses Ability to See in Adequate Light: 1 Impaired Patient Visual Report: Blurring of vision (Pt reported vision when grey with thousands of black dots as if he was looking through a screen. This has since resolved. Pt still reports seeing a black dot at times.) Vision Assessment?: No apparent visual deficits                Pertinent Vitals/Pain Pain Assessment Pain Assessment: 0-10 Pain Score: 1  Pain Location: chest Pain Descriptors / Indicators: Other (Comment) (Uncomfortable) Pain Intervention(s): Monitored during session     Hand Dominance Right   Extremity/Trunk Assessment Upper Extremity Assessment Upper  Extremity Assessment: LUE deficits/detail (R UE WFL) LUE Deficits / Details: 4/5 MMT L shoulder flexion; 4+/5 abduction. 5/5 eblow flexion, 4+/5 elbow extension. 4+/5 grip. WFL fine motor and gross motor skills per observation. LUE Sensation: decreased light touch (Very minimal decrease in light touch around shoulder area.) LUE Coordination: WNL   Lower Extremity Assessment Lower Extremity Assessment: Defer to PT evaluation   Cervical / Trunk Assessment Cervical / Trunk Assessment: Normal   Communication Communication Communication: No difficulties   Cognition Arousal/Alertness: Awake/alert Behavior During Therapy: WFL for tasks assessed/performed Overall Cognitive Status: Within Functional Limits for tasks assessed                                                        Home Living Family/patient expects to be discharged to:: Private residence Living Arrangements: Spouse/significant other Available Help at Discharge: Family;Available PRN/intermittently Type of Home: Mobile home Home Access: Stairs to enter     Home Layout: One level     Bathroom Shower/Tub: Teacher, early years/pre: Standard     Home Equipment: Cane - single point (Per prior history.)          Prior Functioning/Environment Prior Level of Function : Independent/Modified Independent             Mobility Comments: Pt reports indepdnent ambulatio nwithout AD. ADLs Comments: Pt reports indpendence for ADL's and IADL's.                      OT Goals(Current goals can be found in the care plan section) Acute Rehab OT Goals Patient Stated Goal: return home  OT Frequency:      Co-evaluation PT/OT/SLP Co-Evaluation/Treatment: Yes Reason for Co-Treatment: To address functional/ADL transfers   OT goals addressed during session: ADL's and self-care      AM-PAC OT "6 Clicks" Daily Activity     Outcome Measure Help from another person eating meals?: None Help  from another person taking care of personal grooming?: None Help from another person toileting, which includes using toliet, bedpan, or urinal?: None Help from another person bathing (including washing, rinsing, drying)?: None Help from another person to put on and taking off regular upper body clothing?: None Help from another person to put on and taking off regular lower body clothing?: None 6 Click Score: 24   End of Session    Activity Tolerance: Patient tolerated treatment well Patient left: in chair;with call bell/phone within reach  OT Visit Diagnosis: Unsteadiness on feet (R26.81);Other abnormalities of gait and mobility (R26.89);Muscle weakness (generalized) (M62.81);Other symptoms and signs involving the nervous system (R29.898)  Time: 1005-1025 OT Time Calculation (min): 20 min Charges:  OT General Charges $OT Visit: 1 Visit OT Evaluation $OT Eval Low Complexity: 1 Low  Dinara Lupu OT, MOT  Larey Seat 08/03/2021, 10:33 AM

## 2021-08-03 NOTE — Evaluation (Signed)
Physical Therapy Evaluation Patient Details Name: Richard [name] MRN: [id number] DOB: [birth year] Today's Date: 08/03/2021  History of Present Illness  Richard [name] is a 56 y.o. male with medical history significant for hypertension, T2DM, hyperlipidemia, CAD, COPD, hypertension, GERD, atrial fibrillation on Xarelto who presents to the emergency department via EMS due to sudden onset speech difficulty, left-sided vision loss and weakness/numbness in left extremities.  Patient states that symptoms started at 10:20 PM yesterday (08/02/2021).  He complained of chest pain which started while he was just sitting on the couch, this was followed by loss of vision on the left eye, he got up from the couch to let his wife know what was happening to him and realized that he was having difficulty in being able to get his words out, he thought that he was having a stroke since he has positive family history of stroke in his dad and his dad's family, EMS was activated.  Patient's left eye vision, speech difficulty, chest pain resolved back to baseline.  Left-sided numbness/weakness have improved, but, has not completely resolved.  He denies fever, chills, nausea, vomiting, abdominal pain, constipation or diarrhea   Clinical Impression  Patient functioning near baseline for functional mobility and gait demonstrating good return for ambulation on level, inclined, declined surfaces and going up/down steps using 1 side rail without loss of balance.  Patient encouraged to ambulate ad lib as tolerated for length of stay.  Plan:  Patient discharged from physical therapy to care of nursing for ambulation daily as tolerated for length of stay.         Recommendations for follow up therapy are one component of a multi-disciplinary discharge planning process, led by the attending physician.  Recommendations may be updated based on patient status, additional functional criteria and insurance authorization.  Follow Up  Recommendations No PT follow up    Assistance Recommended at Discharge PRN  Patient can return home with the following  Other (comment) (near baseline)    Equipment Recommendations None recommended by PT  Recommendations for Other Services       Functional Status Assessment Patient has not had a recent decline in their functional status     Precautions / Restrictions Precautions Precautions: None Restrictions Weight Bearing Restrictions: No      Mobility  Bed Mobility Overal bed mobility: Independent                  Transfers Overall transfer level: Independent                      Ambulation/Gait Ambulation/Gait assistance: Modified independent (Device/Increase time) Gait Distance (Feet): 200 Feet Assistive device: None Gait Pattern/deviations: Decreased step length - left, Decreased stance time - right, Decreased stride length Gait velocity: decreased     General Gait Details: demonstrates good return for ambulation on level, inclined and declined surfaces without loss of balance  Stairs Stairs: Yes Stairs assistance: Modified independent (Device/Increase time) Stair Management: One rail Right, One rail Left, Step to pattern, Backwards Number of Stairs: 4 General stair comments: demonstrates good return for step-to pattern going up steps using 1 side rail and stepping backwards down steps using 1 side rail without loss of balance  Wheelchair Mobility    Modified Rankin (Stroke Patients Only)       Balance Overall balance assessment: Mild deficits observed, not formally tested  Pertinent Vitals/Pain Pain Assessment Pain Assessment: 0-10 Pain Score: 1  Pain Location: chest Pain Intervention(s): Limited activity within patient's tolerance, Monitored during session    [place] expects to be discharged to:: Private residence Living Arrangements:  Spouse/significant other Available Help at Discharge: Family;Available PRN/intermittently Type of Home: Mobile home Home Access: Stairs to enter Entrance Stairs-Rails: Right;Left Entrance Stairs-Number of Steps: 2   Home Layout: One level Home Equipment: Cane - single point      Prior Function Prior Level of Function : Independent/Modified Independent             Mobility Comments: Community ambulator without AD, drives ADLs Comments: Pt reports indpendence for ADL's and IADL's.     Hand Dominance   Dominant Hand: Right    Extremity/Trunk Assessment   Upper Extremity Assessment Upper Extremity Assessment: Defer to OT evaluation    Lower Extremity Assessment Lower Extremity Assessment: Overall WFL for tasks assessed    Cervical / Trunk Assessment Cervical / Trunk Assessment: Normal  Communication   Communication: No difficulties  Cognition Arousal/Alertness: Awake/alert Behavior During Therapy: WFL for tasks assessed/performed Overall Cognitive Status: Within Functional Limits for tasks assessed                                          General Comments      Exercises     Assessment/Plan    PT Assessment Patient does not need any further PT services  PT Problem List         PT Treatment Interventions      PT Goals (Current goals can be found in the Care Plan section)  Acute Rehab PT Goals Patient Stated Goal: return home PT Goal Formulation: With patient Time For Goal Achievement: 08/03/21 Potential to Achieve Goals: Good    Frequency       Co-evaluation PT/OT/SLP Co-Evaluation/Treatment: Yes Reason for Co-Treatment: To address functional/ADL transfers PT goals addressed during session: Mobility/safety with mobility;Balance         AM-PAC PT "6 Clicks" Mobility  Outcome Measure Help needed turning from your back to your side while in a flat bed without using bedrails?: None Help needed moving from lying on your back to  sitting on the side of a flat bed without using bedrails?: None Help needed moving to and from a bed to a chair (including a wheelchair)?: None Help needed standing up from a chair using your arms (e.g., wheelchair or bedside chair)?: None Help needed to walk in hospital room?: None Help needed climbing 3-5 steps with a railing? : None 6 Click Score: 24    End of Session   Activity Tolerance: Patient tolerated treatment well Patient left: in chair;with call bell/phone within reach Nurse Communication: Mobility status PT Visit Diagnosis: Unsteadiness on feet (R26.81);Other abnormalities of gait and mobility (R26.89);Muscle weakness (generalized) (M62.81)    Time: 8469-6295 PT Time Calculation (min) (ACUTE ONLY): 24 min   Charges:   PT Evaluation $PT Eval Moderate Complexity: 1 Mod PT Treatments $Gait Training: 23-37 mins        2:16 PM, 08/03/21 Lonell Grandchild, MPT Physical Therapist with [facility] [phone] office [phone] mobile phone

## 2021-08-03 NOTE — Progress Notes (Signed)
CODE STROKE  Call time  2330 Bee[place] time  none Exam start  2333 Exam end  2336 [facility]   2336 [place]   2336 [place]  2337

## 2021-08-04 ENCOUNTER — Telehealth: Payer: Self-pay | Admitting: Internal Medicine

## 2021-08-04 LAB — COMPREHENSIVE METABOLIC PANEL
ALT: 48 U/L — ABNORMAL HIGH (ref 0–44)
AST: 27 U/L (ref 15–41)
Albumin: 4 g/dL (ref 3.5–5.0)
Alkaline Phosphatase: 45 U/L (ref 38–126)
Anion gap: 9 (ref 5–15)
BUN: 14 mg/dL (ref 6–20)
CO2: 24 mmol/L (ref 22–32)
Calcium: 8.9 mg/dL (ref 8.9–10.3)
Chloride: 106 mmol/L (ref 98–111)
Creatinine, Ser: 0.87 mg/dL (ref 0.61–1.24)
GFR, Estimated: >60 mL/min (ref >60)
Glucose, Bld: 99 mg/dL (ref 70–99)
Potassium: 4 mmol/L (ref 3.5–5.1)
Sodium: 139 mmol/L (ref 135–145)
Total Bilirubin: 0.5 mg/dL (ref 0.3–1.2)
Total Protein: 6.6 g/dL (ref 6.5–8.1)

## 2021-08-04 LAB — CBC
HCT: 43.4 % (ref 39.0–52.0)
Hemoglobin: 14.9 g/dL (ref 13.0–17.0)
MCH: 31.9 pg (ref 26.0–34.0)
MCHC: 34.3 g/dL (ref 30.0–36.0)
MCV: 92.9 fL (ref 80.0–100.0)
Platelets: 192 10*3/uL (ref 150–400)
RBC: 4.67 MIL/uL (ref 4.22–5.81)
RDW: 12.2 % (ref 11.5–15.5)
WBC: 6 10*3/uL (ref 4.0–10.5)
nRBC: 0 % (ref 0.0–0.2)

## 2021-08-04 LAB — MAGNESIUM: Magnesium: 2.5 mg/dL — ABNORMAL HIGH (ref 1.7–2.4)

## 2021-08-04 LAB — GLUCOSE, CAPILLARY
Glucose-Capillary: 115 mg/dL — ABNORMAL HIGH (ref 70–99)
Glucose-Capillary: 132 mg/dL — ABNORMAL HIGH (ref 70–99)

## 2021-08-04 LAB — PHOSPHORUS: Phosphorus: 4.2 mg/dL (ref 2.5–4.6)

## 2021-08-04 NOTE — Discharge Instructions (Signed)
Advised to follow-up with primary care physician in 1 week. Advised to resume all home medications. Stroke work-up has been negative.  Patient is diagnosed to have complicated migraine. Advised to follow-up with neurology at [facility] in 4-week Advised to follow-up with cardiology for atrial fibrillation.

## 2021-08-04 NOTE — Telephone Encounter (Signed)
Pt's wife calling regarding the pt's results and Vicente Males had advised that they were going to address this at hospital but reading through the notes he had denied any pain with his abdomen. Please advise

## 2021-08-04 NOTE — Telephone Encounter (Signed)
Wife called in with question/conern for the patient. Wife stated they told him to stop taking his medication as well as stop smoking. Wife want [name] to take a look at everything to make sure that need to be done.

## 2021-08-04 NOTE — Telephone Encounter (Signed)
Returned call to wife. No answer. Voice mail full.

## 2021-08-04 NOTE — Telephone Encounter (Signed)
Lets add him to the cancellation list and see if we get him in sooner than that.  Thank you

## 2021-08-04 NOTE — Telephone Encounter (Signed)
I do want him to stop smoking.  I do want him to take all of his meds.

## 2021-08-04 NOTE — Telephone Encounter (Signed)
Spoke with wife who request that Dr.Taylor review pt's chart.

## 2021-08-04 NOTE — Progress Notes (Signed)
Patient walked the floor with nurse. HR maintained between 54-65 bpm. Checked BP when we got back to room and it was 121/73. Notified MD.

## 2021-08-04 NOTE — Telephone Encounter (Signed)
Phoned and spoke with the pt and advised of his result note and instructions for medication use. Advised that follow-up is needed. Pt states he is still loosing a lot of weight and still has the same issues with burning in his abd and pain.  Pt has appt with Vicente Males on 5/2 @ 10 am. Pt wants an urgent appt to be evaluated for his weight loss.  Please advise.

## 2021-08-04 NOTE — Telephone Encounter (Signed)
Noted  Routed to [facility]

## 2021-08-04 NOTE — Discharge Summary (Signed)
Physician Discharge Summary  Richard [name]:[id number] DOB: 24-Aug-1965 DOA: 08/02/2021  PCP: [name], PA-C  Admit date: 08/02/2021  Discharge date: 08/04/2021  Admitted From: Home.  Disposition:  Home.  Recommendations for Outpatient Follow-up:  Follow up with PCP in 1-2 weeks. Please obtain BMP/CBC in one week. Advised to resume all home medications. Stroke work-up has been negative.  Patient is diagnosed to have complicated migraine. Advised to follow-up with neurology at [facility] in 4-week Advised to follow-up with cardiology for atrial fibrillation.  Home Health: None Equipment/Devices:None  Discharge Condition: Stable CODE STATUS:Full code Diet recommendation: Heart Healthy  Brief [facility] Course: This 56 years old Male with PMH significant for hypertension, type 2 diabetes, hyperlipidemia, CAD, COPD, GERD, atrial fibrillation on Xarelto presented in the ED via EMS with sudden onset of speech difficulty, left-sided vision loss and weakness and numbness in the left extremities.  EMS was called.  Patient's symptoms has resolved and back to baseline after EMS arrived.  Patient still has some mild left-sided numbness and weakness.  Work-up in the ED showed bradycardia, electrolyte abnormalities, urine drug screen positive for benzodiazepines.  CT head no acute intracranial abnormality.  CTA head and neck is unremarkable. Tele-neurology was consulted,  recommended further stroke work-up. MRI unremarkable, Echocardiogram showed LVEF 55 to 60%, no regional wall motion abnormalities.  Continue aspirin and statins.  Metoprolol was kept on hold because of bradycardia.  He reported symptoms has resolved.  No PT and OT services needed.  Patient feels better and wants to be discharged. Patient is being discharged home.Advised to follow-up with [facility] in 4 weeks, advised to follow-up with cardiology for A. fib.  Discharge  Diagnoses:  Principal Problem:   Acute ischemic stroke [facility]) Active Problems:   Mixed hyperlipidemia   GERD (gastroesophageal reflux disease)   COPD (chronic obstructive pulmonary disease) (HCC)   Essential hypertension   Hypokalemia   Hyperglycemia due to diabetes mellitus ([place])   Atrial fibrillation, chronic [facility])    Discharge Instructions  Discharge Instructions     Call MD for:  difficulty breathing, headache or visual disturbances   Complete by: As directed    Call MD for:  persistant dizziness or light-headedness   Complete by: As directed    Call MD for:  persistant nausea and vomiting   Complete by: As directed    Diet - low sodium heart healthy   Complete by: As directed    Diet Carb Modified   Complete by: As directed    Discharge instructions   Complete by: As directed    Advised to follow-up with primary care physician in 1 week. Advised to resume all home medications. Stroke work-up has been negative.  Patient is diagnosed to have complicated migraine. Advised to follow-up with neurology at [facility] in 4-week Advised to follow-up with cardiology for atrial fibrillation.   Increase activity slowly   Complete by: As directed       Allergies as of 08/04/2021       Reactions   Dexilant [dexlansoprazole] Anaphylaxis   Mushroom Ext Cmplx(shiitake-reishi-mait) Anaphylaxis   Rapid heart rate.   Penicillins Anaphylaxis   Has patient had a PCN reaction causing immediate rash, facial/tongue/throat swelling, SOB or lightheadedness with hypotension: Yes Has patient had a PCN reaction causing severe rash involving mucus membranes or skin necrosis: No Has patient had a PCN reaction that required hospitalization Yes Has patient had a PCN reaction occurring within the last 10 years: No  If all of the above answers are "NO", then may proceed with Cephalosporin use.   Doxycycline Nausea And Vomiting           Medication List     STOP taking  these medications    ALPRAZolam 0.5 MG tablet Commonly known as: XANAX   amLODipine 10 MG tablet Commonly known as: NORVASC   bisacodyl 10 MG suppository Commonly known as: Bisacodyl Laxative   hydrocortisone 2.5 % rectal cream Commonly known as: Anusol-HC   lidocaine 2 % solution Commonly known as: XYLOCAINE   polyethylene glycol 17 g packet Commonly known as: MIRALAX / GLYCOLAX       TAKE these medications    albuterol 108 (90 Base) MCG/ACT inhaler Commonly known as: VENTOLIN HFA INHALE 2 PUFFS BY MOUTH EVERY 6 HOURS AS NEEDED FOR COUGHING, WHEEZING, OR SHORTNESS OF BREATH   esomeprazole 40 MG capsule Commonly known as: NexIUM Take 1 capsule (40 mg total) by mouth 2 (two) times daily before a meal. What changed: when to take this   Januvia 100 MG tablet Generic drug: sitaGLIPtin TAKE 1 Tablet BY MOUTH ONCE DAILY   metoprolol succinate 25 MG 24 hr tablet Commonly known as: Toprol XL Take 1 tablet (25 mg total) by mouth daily.   Multaq 400 MG tablet Generic drug: dronedarone Take 1 tablet (400 mg total) by mouth 2 (two) times daily with a meal.   nitroGLYCERIN 0.4 MG SL tablet Commonly known as: NITROSTAT Place 1 tablet (0.4 mg total) under the tongue every 5 (five) minutes x 3 doses as needed for chest pain (if no relief after 3rd dose, proceed to the ED for an evaluation).   simvastatin 20 MG tablet Commonly known as: ZOCOR TAKE 1 TABLET BY MOUTH AT BEDTIME   Xarelto 20 MG Tabs tablet Generic drug: rivaroxaban TAKE 1 Tablet BY MOUTH ONCE EVERY DAY WITH SUPPER        Follow-up Information     [name], PA-C Follow up in 1 week(s).   Specialty: Physician Assistant Contact information: [address] [place] [address] [phone]         Satira Sark, MD .   Specialty: Cardiology Contact information: [place] [address] [facility] [place]. Follow up in 1  month(s).   Contact information: [address] [place] [address] [phone]                Allergies  Allergen Reactions   Dexilant [Dexlansoprazole] Anaphylaxis   Mushroom Ext Cmplx(Shiitake-Reishi-Mait) Anaphylaxis    Rapid heart rate.   Penicillins Anaphylaxis    Has patient had a PCN reaction causing immediate rash, facial/tongue/throat swelling, SOB or lightheadedness with hypotension: Yes Has patient had a PCN reaction causing severe rash involving mucus membranes or skin necrosis: No Has patient had a PCN reaction that required hospitalization Yes Has patient had a PCN reaction occurring within the last 10 years: No If all of the above answers are "NO", then may proceed with Cephalosporin use.    Doxycycline Nausea And Vomiting         Consultations: Neurology   Procedures/Studies: MR BRAIN WO CONTRAST  Result Date: 08/03/2021 CLINICAL DATA:  Neuro deficit, acute, stroke suspected. Left-sided weakness/numbness, left eye vision loss, and speech difficulty. EXAM: MRI HEAD WITHOUT CONTRAST TECHNIQUE: Multiplanar, multiecho pulse sequences of the brain and surrounding structures were obtained without intravenous contrast. COMPARISON:  Head CT  08/02/2021 and MRI 10/01/2020 FINDINGS: Brain: There is no evidence of an acute infarct, intracranial hemorrhage, mass, midline shift, or extra-axial fluid collection. The ventricles and sulci are normal. The brain is normal in signal. Vascular: Major intracranial vascular flow voids are preserved. Skull and upper cervical spine: Unremarkable bone marrow signal para Sinuses/Orbits: Unremarkable orbits. Mild mucosal thickening in the paranasal sinuses. No significant mastoid fluid. Other: None. IMPRESSION: Unremarkable appearance of the brain. Electronically Signed   By: Logan Bores M.D.   On: 08/03/2021 12:14   DG Chest Port 1 View  Result Date: 08/03/2021 CLINICAL DATA:  Chest pain. EXAM: PORTABLE CHEST 1 VIEW  COMPARISON:  Chest radiograph dated 04/18/2021. FINDINGS: No focal consolidation, pleural effusion or pneumothorax. Top-normal cardiac size. No acute osseous pathology. IMPRESSION: No active cardiopulmonary disease. Electronically Signed   By: Anner Crete M.D.   On: 08/03/2021 00:39   ECHOCARDIOGRAM COMPLETE  Result Date: 08/03/2021    ECHOCARDIOGRAM REPORT   Patient Name:   LAMARIUS DIRR Date of Exam: 08/03/2021 Medical Rec #:  [id number]      Height:       71.0 in Accession #:    [id number]     Weight:       174.0 lb Date of Birth:  02-Jul-1966      BSA:          1.987 m Patient Age:    56 years       BP:           121/73 mmHg Patient Gender: M              HR:           53 bpm. Exam Location:  [facility] Procedure: 2D Echo, Cardiac Doppler and Color Doppler Indications:    Stroke I63.9  History:        Patient has prior history of Echocardiogram examinations, most                 recent 02/24/2020. CAD, COPD, TIA and Stroke, Arrythmias:Atrial                 Fibrillation; Risk Factors:Hypertension, Dyslipidemia, Current                 Smoker and Diabetes. Arteriosclerotic cardiovascular disease                 (ASCVD).  Sonographer:    Alvino Chapel RCS Referring Phys: [id number] OLADAPO ADEFESO IMPRESSIONS  1. Left ventricular ejection fraction, by estimation, is 60 to 65%. The left ventricle has normal function. The left ventricle has no regional wall motion abnormalities. Left ventricular diastolic parameters were normal.  2. Right ventricular systolic function is normal. The right ventricular size is normal. Tricuspid regurgitation signal is inadequate for assessing PA pressure.  3. The mitral valve is normal in structure. No evidence of mitral valve regurgitation. No evidence of mitral stenosis.  4. The aortic valve was not well visualized. Aortic valve regurgitation is not visualized. No aortic stenosis is present.  5. The inferior vena cava is dilated in size with >50% respiratory variability,  suggesting right atrial pressure of 8 mmHg. FINDINGS  Left Ventricle: Left ventricular ejection fraction, by estimation, is 60 to 65%. The left ventricle has normal function. The left ventricle has no regional wall motion abnormalities. The left ventricular internal cavity size was normal in size. There is  no left ventricular hypertrophy. Left ventricular diastolic parameters were normal. Right Ventricle: The right  ventricular size is normal. No increase in right ventricular wall thickness. Right ventricular systolic function is normal. Tricuspid regurgitation signal is inadequate for assessing PA pressure. Left Atrium: Left atrial size was normal in size. Right Atrium: Right atrial size was normal in size. Pericardium: Trivial pericardial effusion is present. Mitral Valve: The mitral valve is normal in structure. No evidence of mitral valve regurgitation. No evidence of mitral valve stenosis. Tricuspid Valve: The tricuspid valve is normal in structure. Tricuspid valve regurgitation is trivial. Aortic Valve: The aortic valve was not well visualized. Aortic valve regurgitation is not visualized. No aortic stenosis is present. Pulmonic Valve: The pulmonic valve was not well visualized. Pulmonic valve regurgitation is not visualized. Aorta: The aortic root is normal in size and structure. Venous: The inferior vena cava is dilated in size with greater than 50% respiratory variability, suggesting right atrial pressure of 8 mmHg. IAS/Shunts: The interatrial septum was not well visualized.  LEFT VENTRICLE PLAX 2D LVIDd:         4.80 cm   Diastology LVIDs:         2.80 cm   LV e' medial:    9.46 cm/s LV PW:         1.00 cm   LV E/e' medial:  10.3 LV IVS:        1.00 cm   LV e' lateral:   15.70 cm/s LVOT diam:     1.70 cm   LV E/e' lateral: 6.2 LV SV:         64 LV SV Index:   32 LVOT Area:     2.27 cm  RIGHT VENTRICLE RV S prime:     13.30 cm/s TAPSE (M-mode): 2.4 cm LEFT ATRIUM             Index        RIGHT ATRIUM            Index LA diam:        3.50 cm 1.76 cm/m   RA Area:     17.40 cm LA Vol (A2C):   48.3 ml 24.30 ml/m  RA Volume:   49.20 ml  24.76 ml/m LA Vol (A4C):   49.8 ml 25.06 ml/m LA Biplane Vol: 53.0 ml 26.67 ml/m  AORTIC VALVE LVOT Vmax:   135.00 cm/s LVOT Vmean:  75.700 cm/s LVOT VTI:    0.284 m  AORTA Ao Root diam: 3.00 cm MITRAL VALVE MV Area (PHT): 2.56 cm    SHUNTS MV Decel Time: 296 msec    Systemic VTI:  0.28 m MV E velocity: 97.10 cm/s  Systemic Diam: 1.70 cm MV A velocity: 76.40 cm/s MV E/A ratio:  1.27 Oswaldo Milian MD Electronically signed by Oswaldo Milian MD Signature Date/Time: 08/03/2021/2:51:16 PM    Final    CT HEAD CODE STROKE WO CONTRAST  Result Date: 08/02/2021 CLINICAL DATA:  Code stroke. Initial evaluation for acute left-sided weakness. EXAM: CT HEAD WITHOUT CONTRAST TECHNIQUE: Contiguous axial images were obtained from the base of the skull through the vertex without intravenous contrast. RADIATION DOSE REDUCTION: This exam was performed according to the departmental dose-optimization program which includes automated exposure control, adjustment of the mA and/or kV according to patient size and/or use of iterative reconstruction technique. COMPARISON:  Prior MRI from 10/01/2020 FINDINGS: Brain: Cerebral volume within normal limits. No acute intracranial hemorrhage. No acute large vessel territory infarct. No mass lesion, midline shift or mass effect. No hydrocephalus or extra-axial fluid collection. Vascular: No hyperdense vessel. Skull:  Scalp soft tissues and calvarium within normal limits. Sinuses/Orbits: Globes and orbital soft tissues demonstrate no acute finding. Mild scattered mucosal thickening noted within the ethmoidal air cells. Paranasal sinuses are otherwise clear. No mastoid effusion. Other: None. ASPECTS [facility] Stroke Program Early CT Score) - Ganglionic level infarction (caudate, lentiform nuclei, internal capsule, insula, M1-M3 cortex): 7. - Supraganglionic  infarction (M4-M6 cortex): 3 Total score (0-10 with 10 being normal): 10 IMPRESSION: 1. Negative head CT.  No acute intracranial abnormality. 2. ASPECTS is 10. Critical Value/emergent results were called by telephone at the time of interpretation on 08/02/2021 at 11:48 pm to provider [facility] , who verbally acknowledged these results. Electronically Signed   By: Jeannine Boga M.D.   On: 08/02/2021 23:51   CT ANGIO HEAD NECK W WO CM (CODE STROKE)  Result Date: 08/03/2021 CLINICAL DATA:  Initial evaluation for neuro deficit, left-sided weakness and visual disturbance. EXAM: CT ANGIOGRAPHY HEAD AND NECK TECHNIQUE: Multidetector CT imaging of the head and neck was performed using the standard protocol during bolus administration of intravenous contrast. Multiplanar CT image reconstructions and MIPs were obtained to evaluate the vascular anatomy. Carotid stenosis measurements (when applicable) are obtained utilizing NASCET criteria, using the distal internal carotid diameter as the denominator. RADIATION DOSE REDUCTION: This exam was performed according to the departmental dose-optimization program which includes automated exposure control, adjustment of the mA and/or kV according to patient size and/or use of iterative reconstruction technique. CONTRAST:  121mL OMNIPAQUE IOHEXOL 350 MG/ML SOLN COMPARISON:  Prior CT from 08/02/2021. FINDINGS: CTA NECK FINDINGS Aortic arch: Visualized aortic arch normal caliber with normal branch pattern. No stenosis or other abnormality about the origin of the great vessels. Right carotid system: Right common and internal carotid arteries widely patent without stenosis, dissection or occlusion. Left carotid system: Left common and internal carotid arteries widely patent without stenosis, dissection or occlusion. Vertebral arteries: Both vertebral arteries arise from the subclavian arteries. No proximal subclavian artery stenosis. Both vertebral arteries widely patent  without stenosis, dissection or occlusion. Skeleton: No discrete or worrisome osseous lesions. Mild-to-moderate spondylosis present at C4-5 through C6-7. Other neck: No other acute soft tissue abnormality within the neck. Upper chest: Visualized upper chest demonstrates no acute finding. Review of the MIP images confirms the above findings CTA HEAD FINDINGS Anterior circulation: Petrous segments patent bilaterally. Mild for age atheromatous change within the carotid siphons without significant stenosis. A1 segments patent bilaterally. Left A1 hypoplastic. Normal anterior communicating artery complex. Anterior cerebral arteries patent without stenosis. No M1 stenosis or occlusion. Normal MCA bifurcations. Distal MCA branches perfused and symmetric. Posterior circulation: Both vertebral arteries patent to the vertebrobasilar junction without stenosis. Right vertebral artery dominant. Both PICA origins patent and normal. Basilar patent to its distal aspect without stenosis. Superior cerebellar and posterior cerebral arteries widely patent bilaterally. Venous sinuses: Grossly patent allowing for timing the contrast bolus. Anatomic variants: None significant.  No aneurysm. Review of the MIP images confirms the above findings IMPRESSION: Normal CTA of the head and neck. No large vessel occlusion, hemodynamically significant stenosis, or other acute vascular abnormality. Electronically Signed   By: Jeannine Boga M.D.   On: 08/03/2021 01:16    Echocardiogram.   Subjective: Patient was seen and examined at bedside.  Overnight events noted.  Patient reports feeling much improved.  Symptoms has resolved.  Patient want to be discharged.  Patient is being discharged home.  Discharge Exam: Vitals:   08/04/21 0302 08/04/21 0516  BP: 126/72 131/84  Pulse: [name])  46 (!) 47  Resp: 15 14  Temp: 98 F (36.7 C) 97.9 F (36.6 C)  SpO2: 97% 98%   Vitals:   08/03/21 2058 08/03/21 2318 08/04/21 0302 08/04/21 0516   BP: 126/71 126/76 126/72 131/84  Pulse: (!) 51 (!) 46 (!) 46 (!) 47  Resp: 15 15 15 14   Temp: 98.6 F (37 C) 98 F (36.7 C) 98 F (36.7 C) 97.9 F (36.6 C)  TempSrc: Oral  Oral Oral  SpO2: 96% 99% 97% 98%  Weight:      Height:        General: Pt is alert, awake, not in acute distress Cardiovascular: RRR, S1/S2 +, no rubs, no gallops Respiratory: CTA bilaterally, no wheezing, no rhonchi Abdominal: Soft, NT, ND, bowel sounds + Extremities: no edema, no cyanosis    The results of significant diagnostics from this hospitalization (including imaging, microbiology, ancillary and laboratory) are listed below for reference.     Microbiology: Recent Results (from the past 240 hour(s))  Resp Panel by RT-PCR (Flu A&B, Covid) Nasopharyngeal Swab     Status: None   Collection Time: 08/03/21  1:29 AM   Specimen: Nasopharyngeal Swab; Nasopharyngeal(NP) swabs in vial transport medium  Result Value Ref Range Status   SARS Coronavirus 2 by RT PCR NEGATIVE NEGATIVE Final    Comment: (NOTE) SARS-CoV-2 target nucleic acids are NOT DETECTED.  The SARS-CoV-2 RNA is generally detectable in upper respiratory specimens during the acute phase of infection. The lowest concentration of SARS-CoV-2 viral copies this assay can detect is 138 copies/mL. A negative result does not preclude SARS-Cov-2 infection and should not be used as the sole basis for treatment or other patient management decisions. A negative result may occur with  improper specimen collection/handling, submission of specimen other than nasopharyngeal swab, presence of viral mutation(s) within the areas targeted by this assay, and inadequate number of viral copies(<138 copies/mL). A negative result must be combined with clinical observations, patient history, and epidemiological information. The expected result is Negative.  Fact Sheet for Patients:  EntrepreneurPulse.com.au  Fact Sheet for Healthcare  Providers:  IncredibleEmployment.be  This test is no t yet approved or cleared by the [place] FDA and  has been authorized for detection and/or diagnosis of SARS-CoV-2 by FDA under an Emergency Use Authorization (EUA). This EUA will remain  in effect (meaning this test can be used) for the duration of the COVID-19 declaration under Section 564(b)(1) of the Act, 21 U.S.C.section 360bbb-3(b)(1), unless the authorization is terminated  or revoked sooner.       Influenza A by PCR NEGATIVE NEGATIVE Final   Influenza B by PCR NEGATIVE NEGATIVE Final    Comment: (NOTE) The Xpert Xpress SARS-CoV-2/FLU/RSV plus assay is intended as an aid in the diagnosis of influenza from Nasopharyngeal swab specimens and should not be used as a sole basis for treatment. Nasal washings and aspirates are unacceptable for Xpert Xpress SARS-CoV-2/FLU/RSV testing.  Fact Sheet for Patients: EntrepreneurPulse.com.au  Fact Sheet for Healthcare Providers: IncredibleEmployment.be  This test is not yet approved or cleared by the [place] FDA and has been authorized for detection and/or diagnosis of SARS-CoV-2 by FDA under an Emergency Use Authorization (EUA). This EUA will remain in effect (meaning this test can be used) for the duration of the COVID-19 declaration under Section 564(b)(1) of the Act, 21 U.S.C. section 360bbb-3(b)(1), unless the authorization is terminated or revoked.  Performed at [facility], [address] [place], [place] [address]  Labs: BNP (last 3 results) No results for input(s): BNP in the last 8760 hours. Basic Metabolic Panel: Recent Labs  Lab 08/02/21 2343 08/02/21 2346 08/04/21 0535  NA 136 139 139  K 3.4* 3.4* 4.0  CL 103 101 106  CO2 23  --  24  GLUCOSE 161* 159* 99  BUN 19 18 14   CREATININE 1.05 1.10 0.87  CALCIUM 8.6*  --  8.9  MG  --   --  2.5*  PHOS  --   --  4.2   Liver Function  Tests: Recent Labs  Lab 08/02/21 2343 08/04/21 0535  AST 17 27  ALT 33 48*  ALKPHOS 46 45  BILITOT 0.4 0.5  PROT 6.8 6.6  ALBUMIN 4.0 4.0   No results for input(s): LIPASE, AMYLASE in the last 168 hours. No results for input(s): AMMONIA in the last 168 hours. CBC: Recent Labs  Lab 08/02/21 2343 08/02/21 2346 08/04/21 0535  WBC 7.7  --  6.0  NEUTROABS 5.2  --   --   HGB 14.5 13.9 14.9  HCT 41.5 41.0 43.4  MCV 92.0  --  92.9  PLT 179  --  192   Cardiac Enzymes: No results for input(s): CKTOTAL, CKMB, CKMBINDEX, TROPONINI in the last 168 hours. BNP: Invalid input(s): POCBNP CBG: Recent Labs  Lab 08/03/21 1115 08/03/21 1637 08/03/21 2149 08/04/21 0728 08/04/21 1124  GLUCAP 154* 119* 104* 115* 132*   D-Dimer No results for input(s): DDIMER in the last 72 hours. Hgb A1c Recent Labs    08/03/21 0954  HGBA1C 5.8*   Lipid Profile Recent Labs    08/03/21 0954  CHOL 94  HDL 35*  LDLCALC 50  TRIG 46  CHOLHDL 2.7   Thyroid function studies No results for input(s): TSH, T4TOTAL, T3FREE, THYROIDAB in the last 72 hours.  Invalid input(s): FREET3 Anemia work up No results for input(s): VITAMINB12, FOLATE, FERRITIN, TIBC, IRON, RETICCTPCT in the last 72 hours. Urinalysis    Component Value Date/Time   COLORURINE YELLOW 08/03/2021 0127   APPEARANCEUR CLEAR 08/03/2021 0127   LABSPEC 1.015 08/03/2021 0127   PHURINE 5.5 08/03/2021 0127   GLUCOSEU NEGATIVE 08/03/2021 0127   HGBUR NEGATIVE 08/03/2021 0127   BILIRUBINUR NEGATIVE 08/03/2021 0127   KETONESUR NEGATIVE 08/03/2021 0127   PROTEINUR NEGATIVE 08/03/2021 0127   UROBILINOGEN 0.2 10/14/2012 1800   NITRITE NEGATIVE 08/03/2021 0127   LEUKOCYTESUR NEGATIVE 08/03/2021 0127   Sepsis Labs Invalid input(s): PROCALCITONIN,  WBC,  LACTICIDVEN Microbiology Recent Results (from the past 240 hour(s))  Resp Panel by RT-PCR (Flu A&B, Covid) Nasopharyngeal Swab     Status: None   Collection Time: 08/03/21  1:29 AM    Specimen: Nasopharyngeal Swab; Nasopharyngeal(NP) swabs in vial transport medium  Result Value Ref Range Status   SARS Coronavirus 2 by RT PCR NEGATIVE NEGATIVE Final    Comment: (NOTE) SARS-CoV-2 target nucleic acids are NOT DETECTED.  The SARS-CoV-2 RNA is generally detectable in upper respiratory specimens during the acute phase of infection. The lowest concentration of SARS-CoV-2 viral copies this assay can detect is 138 copies/mL. A negative result does not preclude SARS-Cov-2 infection and should not be used as the sole basis for treatment or other patient management decisions. A negative result may occur with  improper specimen collection/handling, submission of specimen other than nasopharyngeal swab, presence of viral mutation(s) within the areas targeted by this assay, and inadequate number of viral copies(<138 copies/mL). A negative result must be combined with clinical observations, patient history,  and epidemiological information. The expected result is Negative.  Fact Sheet for Patients:  Entrepreneur[employer]ulse.[employer].au  Fact Sheet for Healthcare Providers:  IncredibleEmployment.be  This test is no t yet approved or cleared by the [place] FDA and  has been authorized for detection and/or diagnosis of SARS-CoV-2 by FDA under an Emergency Use Authorization (EUA). This EUA will remain  in effect (meaning this test can be used) for the duration of the COVID-19 declaration under Section 564(b)(1) of the Act, 21 U.S.C.section 360bbb-3(b)(1), unless the authorization is terminated  or revoked sooner.       Influenza A by PCR NEGATIVE NEGATIVE Final   Influenza B by PCR NEGATIVE NEGATIVE Final    Comment: (NOTE) The Xpert Xpress SARS-CoV-2/FLU/RSV plus assay is intended as an aid in the diagnosis of influenza from Nasopharyngeal swab specimens and should not be used as a sole basis for treatment. Nasal washings and aspirates are  unacceptable for Xpert Xpress SARS-CoV-2/FLU/RSV testing.  Fact Sheet for Patients: EntrepreneurPulse.com.au  Fact Sheet for Healthcare Providers: IncredibleEmployment.be  This test is not yet approved or cleared by the [place] FDA and has been authorized for detection and/or diagnosis of SARS-CoV-2 by FDA under an Emergency Use Authorization (EUA). This EUA will remain in effect (meaning this test can be used) for the duration of the COVID-19 declaration under Section 564(b)(1) of the Act, 21 U.S.C. section 360bbb-3(b)(1), unless the authorization is terminated or revoked.  Performed at [facility], [address] [place], [place] [address]      Time coordinating discharge: Over 30 minutes  SIGNED:   Shawna Clamp, MD  [facility] 08/04/2021, 2:53 PM Pager   If 7PM-7AM, please contact night-coverage

## 2021-08-04 NOTE — Telephone Encounter (Signed)
Patient has iron pill gastritis on his gastric biopsies.  Continue to take Nexium.  Needs office visit follow-up.  Thank you

## 2021-08-05 ENCOUNTER — Ambulatory Visit: Payer: Self-pay

## 2021-08-05 ENCOUNTER — Telehealth (HOSPITAL_COMMUNITY): Payer: Self-pay | Admitting: *Deleted

## 2021-08-05 ENCOUNTER — Other Ambulatory Visit (HOSPITAL_COMMUNITY): Payer: Self-pay | Admitting: Psychiatry

## 2021-08-05 MED ORDER — ALPRAZOLAM 0.5 MG PO TABS: ORAL_TABLET | ORAL | 1 refills | Status: DC

## 2021-08-05 NOTE — Telephone Encounter (Signed)
Wife of patient called back. She was not able to get the patient into the behavioral health until Tuesday. She wanted to talk to [facility] again to see what else the office recommends. The patient takes xanax to sleep . She wanted to know what else the patient can take

## 2021-08-05 NOTE — Telephone Encounter (Signed)
Xanax was sent in, quantity has been reduced a little

## 2021-08-05 NOTE — Telephone Encounter (Signed)
In interim, we could pursue CT angiogram. Doubt dealing with chronic mesenteric ischemia but with weight loss and abdominal pain, need to rule out. Dena please let him know we can do this if he is willing.   [facility] clinical pool: please arrange.

## 2021-08-05 NOTE — Telephone Encounter (Signed)
Pt notified of Dr. Tanna Furry note

## 2021-08-05 NOTE — Telephone Encounter (Signed)
Patient called stating that the hospital did not give him his Xanax when he was in there and when he was discharged. Per patient he have not been able to sleep since he got out of the hospital. Per pt he needs his Xanax.   Patient chart states that the hospital took him off of the Xanax. Per pt, he needs his Xanax. Appt was made.   Per pt he was in the hospital due to heart related issues and his heart right runs at 43. Per patient he was not informed of the risk of Xanax and having to take Blood thinners and heart rate being low and Xanax but he needs his Xanax and he can't sleep.   Per pt he also have really bad anxiety and he use the Xanax for that as well and he needs his Xanax.   [phone]

## 2021-08-05 NOTE — Telephone Encounter (Signed)
Returned call to pt and wife. No answer. Left msg to call back.

## 2021-08-05 NOTE — Telephone Encounter (Signed)
Spoke with wife and encouraged to call PCP

## 2021-08-05 NOTE — Telephone Encounter (Signed)
°  Chief Complaint: medication question, resuming Xanax Symptoms: unable to sleep, increased anxiety Frequency: several days Pertinent Negatives: NA Disposition: [] ED /[] Urgent Care (no appt availability in office) / [] Appointment(In office/virtual)/ []  [facility]/ [] Home Care/ [] Refused Recommended Disposition /[] [place] [facility]/ [x]  Follow-up with PCP Additional Notes: Pt's PCP and BH is closed today at noon and pt had question on resuming Xanax at bedtime to assist with sleep. He states MD from ER told him to stop taking d/t chest pain and low HR but pt states his HR is back to normal and hasn't had chest pain. He states he hasn't slept in 3 days and took 1 Xanax this morning and was able to sleep for a couple of hours. Pt has also quit smoking so anxiety has increased. Advised pt if symptoms aren't present when he takes Xanax and it helps with sleep/anxiety that's ok but needs to f/up with [facility] provider Monday or Tuesday. Also advised pt if symptoms present when went to ED to go back to ED to be evaluated. Pt and wife verbalized understanding.    Reason for Disposition  [1] Caller has NON-URGENT medicine question about med that PCP prescribed AND [2] triager unable to answer question  Answer Assessment - Initial Assessment Questions 1. NAME of MEDICATION: "What medicine are you calling about?"     Xanax  2. QUESTION: "What is your question?" (e.g., double dose of medicine, side effect)     Wanting to  3. PRESCRIBING HCP: "Who prescribed it?" Reason: if prescribed by specialist, call should be referred to that group.     [name] [facility] 4. SYMPTOMS: "Do you have any symptoms?"     Unable to sleep, increased anxiety  Protocols used: Medication Question Call-A-AH

## 2021-08-05 NOTE — Telephone Encounter (Signed)
noted 

## 2021-08-05 NOTE — Telephone Encounter (Signed)
Patient wife's returned your call.

## 2021-08-08 ENCOUNTER — Telehealth: Payer: Self-pay | Admitting: Internal Medicine

## 2021-08-08 NOTE — Telephone Encounter (Signed)
See other note (pt call note)

## 2021-08-08 NOTE — Telephone Encounter (Signed)
Please call patient's wife, [name]. She is aware that we have moved his OV to this Wednesday at 130. She has questions about him not getting the nutrients he needs and being very weak. Please call [phone]

## 2021-08-08 NOTE — Telephone Encounter (Signed)
OV changed to Wednesday at 10am

## 2021-08-08 NOTE — Telephone Encounter (Signed)
Noted  

## 2021-08-08 NOTE — Telephone Encounter (Signed)
Agree with recommendations.  

## 2021-08-08 NOTE — Telephone Encounter (Signed)
Richard [name], was patient agreeable? thanks

## 2021-08-08 NOTE — Telephone Encounter (Signed)
Spoke with patient and informed him with what provider stated and he verbalized understanding.

## 2021-08-08 NOTE — Telephone Encounter (Signed)
Phoned and spoke with the pt and asked regarding the procedure he advised he was too weak to even get up. He kept telling me of how he was worse from last week, more fatigue, chest pain, can't eat, dehydrated, and a metallic taste in his mouth. Stated his appt was moved up here and I advised the pt 4 or 5 times to return to the ED to be evaluated especially because of his chest pain and Fatigued. Pt stated his wife had been telling him the same thing. I advised him we cannot do tests here , EKG s or do anything for his dehydration. Pt continuously spoke on his loosing weight and I advised several more times to go to the ED and tell them everything he is telling me. (Everything I expressed). He expressed understanding that he needs to go and that I couldn't write him a note to get in the ED to be seen first because he couldn't sit there all day without eating or drinking, but the pt had stated he couldn't eat anyway. I advised if this conditions and symptoms are that bad he needs to call EMS and be transported for his safety.

## 2021-08-09 ENCOUNTER — Telehealth (INDEPENDENT_AMBULATORY_CARE_PROVIDER_SITE_OTHER): Payer: Self-pay | Admitting: Psychiatry

## 2021-08-09 ENCOUNTER — Encounter (HOSPITAL_COMMUNITY): Payer: Self-pay | Admitting: Psychiatry

## 2021-08-09 ENCOUNTER — Other Ambulatory Visit: Payer: Self-pay

## 2021-08-09 DIAGNOSIS — F4001 Agoraphobia with panic disorder: Secondary | ICD-10-CM

## 2021-08-09 MED ORDER — TRAZODONE HCL 50 MG PO TABS: 50.0000 mg | ORAL_TABLET | Freq: Every evening | ORAL | 2 refills | Status: DC | PRN

## 2021-08-09 NOTE — Progress Notes (Signed)
Virtual Visit via Telephone Note  I connected with [place] on 08/09/21 at  9:00 AM EST by telephone and verified that I am speaking with the correct person using two identifiers.  Location: Patient: home Provider: office   I discussed the limitations, risks, security and privacy concerns of performing an evaluation and management service by telephone and the availability of in person appointments. I also discussed with the patient that there may be a patient responsible charge related to this service. The patient expressed understanding and agreed to proceed.     I discussed the assessment and treatment plan with the patient. The patient was provided an opportunity to ask questions and all were answered. The patient agreed with the plan and demonstrated an understanding of the instructions.   The patient was advised to call back or seek an in-person evaluation if the symptoms worsen or if the condition fails to improve as anticipated.  I provided 20 minutes of non-face-to-face time during this encounter.   [name], MD  [facility] MD/PA/NP OP Progress Note  08/09/2021 9:37 AM Richard [name]  MRN:  [id number]  Chief Complaint:  Chief Complaint   Anxiety; Follow-up    HPI: This patient is a 56 year old Caucasian male who lives with his wife in [place].  He is unemployed.  The patient returns for follow-up after 6 weeks.  He was hospitalized earlier this month for presumed drug that resolved.  His brain MRI and MRA were normal.  All of his labs actually look pretty good.  He also states he is having a lot of abdominal problems and is "not absorbing nutrition."  He states he is lost about 30 pounds.  When he was in the hospital they told him to quit the Xanax and since that he has not been doing well at all.  He called [name] about this last week and I sent some more and because I do not think is contributing to his cardiac problems.  He is not sleeping more than about 3 hours a night  but he is only taking 1 0.5 mg Xanax.  I urged him to take 2 like he been doing in the past.  The patient denies thoughts of self-harm or suicide but he does seem extremely stressed.  He is still very worried about finances.  His little dog (constant companion has also been diagnosed with lymphoma which has been a real blow to him.  Right now he is extremely anxious and panicky and cannot be left alone.  I strongly urged him to get into therapy and he agrees. Visit Diagnosis:    ICD-10-CM   1. 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 Cardiac cath 07/2015 showed 65% distal Cx, 20% mid-distal LAD, 20% prox-distal RCA, EF 60%, EDP 59mmHg.   Colitis 1990   COPD (chronic obstructive pulmonary disease) ([place])    Depression    Essential hypertension    Gastric ulcer 2003; 2012   2003: + esophagitis; negative H.pylori serology  2012: Dr. Oneida Alar, mild gastritis, Bravo PH probe placement, negative H.pylori   GERD (gastroesophageal reflux disease)    Hepatic steatosis    History of hiatal hernia    Hyperlipemia    Overweight    Panic attacks  Paroxysmal atrial fibrillation (HCC)    Stroke ([place])    TIA (transient ischemic attack)    Type II diabetes mellitus ([place])     Past Surgical History:  Procedure Laterality Date   BALLOON DILATION N/A 07/25/2021   Procedure: BALLOON DILATION;  Surgeon: Eloise Harman, DO;  Location[facility];  Service: Endoscopy;  Laterality: N/A;   BIOPSY  07/25/2021   Procedure: BIOPSY;  Surgeon: Eloise Harman, DO;  Location: [facility];  Service: Endoscopy;;   BRAVO [place] STUDY  05/03/2011   OEU:MPNT gastritis/normal esophagus and duodenum   CARDIAC CATHETERIZATION  1990s X 1; 2005; 08/12/2015   CARDIAC CATHETERIZATION N/A 08/12/2015   Procedure: Left Heart Cath and  Coronary Angiography;  Surgeon: Belva Crome, MD; LAD 20%, CFX 65%, RCA 20%, EF 60%    COLONOSCOPY  1990   COLONOSCOPY WITH PROPOFOL N/A 11/21/2016   Dr. Oneida Alar: non-thrombosed external hemorrhoids, one 6 mm polyp (polypoid lesion), internal hemorrhoids. TI Normal. 10 years screening   ESOPHAGOGASTRODUODENOSCOPY  05/03/2011   IRW:ERXV gastritis   ESOPHAGOGASTRODUODENOSCOPY (EGD) WITH PROPOFOL N/A 07/25/2021   Procedure: ESOPHAGOGASTRODUODENOSCOPY (EGD) WITH PROPOFOL;  Surgeon: Eloise Harman, DO;  [facility];  Service: Endoscopy;  Laterality: N/A;  1:30pm   NECK MASS EXCISION Right    "done in dr's office; behind right ear/side of ncek"   POLYPECTOMY  11/21/2016   Procedure: POLYPECTOMY;  Surgeon: [name], MD;  Location: [facility];  Service: Endoscopy;;  descending colon polyp   SHOULDER ARTHROSCOPY W/ ROTATOR CUFF REPAIR Right 2006   acromioclavicular joint arthrosis    Family Psychiatric History: see below  Family History:  Family History  Problem Relation Age of Onset   Lung cancer Mother    Alcohol abuse Mother    Heart attack Father 22   Diabetes Father    Alcohol abuse Father    Hypertension Brother    Hypertension Brother    Anxiety disorder Sister    Depression Sister    Anxiety disorder Sister    Heart attack Brother 80   Diabetes Brother    Hypertension Brother    Seizures Brother    Dementia Paternal Uncle    Dementia Cousin    ADD / ADHD Daughter    Colon cancer Neg Hx    Drug abuse Neg Hx    Bipolar disorder Neg Hx    OCD Neg Hx    Paranoid behavior Neg Hx    Schizophrenia Neg Hx    Sexual abuse Neg Hx    Physical abuse Neg Hx     Social History:  Social History   Socioeconomic History   Marital status: Married    Spouse name: Not on file   Number of children: Not on file   Years of education: Not on file   Highest education level: Not on file  Occupational History   Occupation: full time    Employer: UNEMPLOYED  Tobacco  Use   Smoking status: Every Day    Packs/day: 0.25    Years: 25.00    Pack years: 6.25    Types: Cigarettes    Start date: 07/17/1982   Smokeless tobacco: Never   Tobacco comments:    4-6 cigarettes daily 04/27/2021  Vaping Use   Vaping Use: Never used  Substance and Sexual Activity   Alcohol use: No    Alcohol/week: 0.0 standard drinks   Drug use: No   Sexual activity: Yes    Birth  control/protection: None  Other Topics Concern   Not on file  Social History Narrative   Pt lives in [place] with wife.  5 children.  Unemployed due to panic attacks and back pain   Social Determinants of Health   Financial Resource Strain: Not on file  Food Insecurity: Not on file  Transportation Needs: Not on file  Physical Activity: Not on file  Stress: Not on file  Social Connections: Not on file    Allergies:  Allergies  Allergen Reactions   Dexilant [Dexlansoprazole] Anaphylaxis   Mushroom Ext Cmplx(Shiitake-Reishi-Mait) Anaphylaxis    Rapid heart rate.   Penicillins Anaphylaxis    Has patient had a PCN reaction causing immediate rash, facial/tongue/throat swelling, SOB or lightheadedness with hypotension: Yes Has patient had a PCN reaction causing severe rash involving mucus membranes or skin necrosis: No Has patient had a PCN reaction that required hospitalization Yes Has patient had a PCN reaction occurring within the last 10 years: No If all of the above answers are "NO", then may proceed with Cephalosporin use.    Doxycycline Nausea And Vomiting         Metabolic Disorder Labs: Lab Results  Component Value Date   HGBA1C 5.8 (H) 08/03/2021   MPG 119.76 08/03/2021   MPG 131.24 04/25/2021   No results found for: PROLACTIN Lab Results  Component Value Date   CHOL 94 08/03/2021   TRIG 46 08/03/2021   HDL 35 (L) 08/03/2021   CHOLHDL 2.7 08/03/2021   VLDL 9 08/03/2021   LDLCALC 50 08/03/2021   LDLCALC 59 04/25/2021   Lab Results  Component Value Date   TSH  1.230 07/20/2021   TSH 3.119 10/01/2020    Therapeutic Level Labs: No results found for: LITHIUM No results found for: VALPROATE No components found for:  CBMZ  Current Medications: Current Outpatient Medications  Medication Sig Dispense Refill   traZODone (DESYREL) 50 MG tablet Take 1 tablet (50 mg total) by mouth at bedtime as needed for sleep. 30 tablet 2   albuterol (VENTOLIN HFA) 108 (90 Base) MCG/ACT inhaler INHALE 2 PUFFS BY MOUTH EVERY 6 HOURS AS NEEDED FOR COUGHING, WHEEZING, OR SHORTNESS OF BREATH 13.4 g 1   ALPRAZolam (XANAX) 0.5 MG tablet Take one twice a day and two at bedtime 120 tablet 1   dronedarone (MULTAQ) 400 MG tablet Take 1 tablet (400 mg total) by mouth 2 (two) times daily with a meal. 60 tablet 11   esomeprazole (NEXIUM) 40 MG capsule Take 1 capsule (40 mg total) by mouth 2 (two) times daily before a meal. (Patient taking differently: Take 40 mg by mouth daily.) 60 capsule 5   JANUVIA 100 MG tablet TAKE 1 Tablet BY MOUTH ONCE DAILY 90 tablet 1   metoprolol succinate (TOPROL XL) 25 MG 24 hr tablet Take 1 tablet (25 mg total) by mouth daily. 90 tablet 3   nitroGLYCERIN (NITROSTAT) 0.4 MG SL tablet Place 1 tablet (0.4 mg total) under the tongue every 5 (five) minutes x 3 doses as needed for chest pain (if no relief after 3rd dose, proceed to the ED for an evaluation). 25 tablet 3   simvastatin (ZOCOR) 20 MG tablet TAKE 1 TABLET BY MOUTH AT BEDTIME 30 tablet 5   XARELTO 20 MG TABS tablet TAKE 1 Tablet BY MOUTH ONCE EVERY DAY WITH SUPPER 90 tablet 1   No current facility-administered medications for this visit.     Musculoskeletal: Strength & Muscle Tone: na Gait & Station: na Patient  leans: N/A  Psychiatric Specialty Exam: Review of Systems  Constitutional:  Positive for appetite change and unexpected weight change.  Cardiovascular:  Positive for palpitations.  Gastrointestinal:  Positive for abdominal pain.  Psychiatric/Behavioral:  The patient is  nervous/anxious.   All other systems reviewed and are negative.  There were no vitals taken for this visit.There is no height or weight on file to calculate BMI.  General Appearance: NA  Eye Contact:  NA  Speech:  Clear and Coherent  Volume:  Normal  Mood:  Anxious  Affect:  NA  Thought Process:  Goal Directed  Orientation:  Full (Time, Place, and Person)  Thought Content: Rumination   Suicidal Thoughts:  No  Homicidal Thoughts:  No  Memory:  Immediate;   Good Recent;   Good Remote;   Good  Judgement:  Fair  Insight:  Fair  Psychomotor Activity:  Decreased and Restlessness  Concentration:  Concentration: Fair and Attention Span: Fair  Recall:  Good  Fund of Knowledge: Good  Language: Good  Akathisia:  No  Handed:  Right  AIMS (if indicated): not done  Assets:  Communication Skills Desire for Improvement Resilience Social Support  ADL's:  Intact  Cognition: WNL  Sleep:  Poor   Screenings: PHQ2-9    Flowsheet Row Video Visit from 06/16/2021 in [facility] Patient Outreach Telephone from 08/30/2015 in [facility] Patient Outreach Telephone from 08/26/2015 in [facility]  PHQ-2 Total Score 1 2 4   PHQ-9 Total Score -- 8 8      Flowsheet Row ED to Hosp-Admission (Discharged) from 08/02/2021 in [facility] Admission (Discharged) from 07/25/2021 in [place] [facility] from 07/20/2021 in [facility] No Risk No Risk No Risk        Assessment and Plan: This patient is a 56 year old male with a history of anxiety and panic attacks.  He seems to be struggling more since he had them stroke and also because of his dogs diagnosis.  He has been restarted on Xanax 0.5 mg twice daily and 1 mg at bedtime for anxiety and sleep.  We will add trazodone 50 mg at bedtime for sleep.  We will also get him in counseling here.  He will return to  see me in 4 weeks   [name], MD 08/09/2021, 9:37 AM

## 2021-08-10 ENCOUNTER — Telehealth: Payer: Self-pay | Admitting: *Deleted

## 2021-08-10 ENCOUNTER — Ambulatory Visit: Payer: Self-pay | Admitting: Gastroenterology

## 2021-08-10 ENCOUNTER — Encounter: Payer: Self-pay | Admitting: Gastroenterology

## 2021-08-10 ENCOUNTER — Ambulatory Visit (INDEPENDENT_AMBULATORY_CARE_PROVIDER_SITE_OTHER): Payer: Self-pay | Admitting: Gastroenterology

## 2021-08-10 ENCOUNTER — Ambulatory Visit: Payer: Self-pay | Admitting: Physician Assistant

## 2021-08-10 DIAGNOSIS — K5900 Constipation, unspecified: Secondary | ICD-10-CM

## 2021-08-10 DIAGNOSIS — R1010 Upper abdominal pain, unspecified: Secondary | ICD-10-CM

## 2021-08-10 DIAGNOSIS — R1084 Generalized abdominal pain: Secondary | ICD-10-CM

## 2021-08-10 DIAGNOSIS — R634 Abnormal weight loss: Secondary | ICD-10-CM

## 2021-08-10 DIAGNOSIS — K219 Gastro-esophageal reflux disease without esophagitis: Secondary | ICD-10-CM

## 2021-08-10 DIAGNOSIS — R1031 Right lower quadrant pain: Secondary | ICD-10-CM

## 2021-08-10 NOTE — Telephone Encounter (Signed)
Pt consented to a telephone visit. °
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
## 2021-08-10 NOTE — Telephone Encounter (Signed)
Richard [name], you are scheduled for a virtual visit with your provider today.  Just as we do with appointments in the office, we must obtain your consent to participate.  Your consent will be active for this visit and any virtual visit you may have with one of our providers in the next 365 days.  If you have a MyChart account, I can also send a copy of this consent to you electronically.  All virtual visits are billed to your insurance company just like a traditional visit in the office.  As this is a virtual visit, video technology does not allow for your provider to perform a traditional examination.  This may limit your provider's ability to fully assess your condition.  If your provider identifies any concerns that need to be evaluated in person or the need to arrange testing such as labs, EKG, etc, we will make arrangements to do so.  Although advances in technology are sophisticated, we cannot ensure that it will always work on either your end or our end.  If the connection with a video visit is poor, we may have to switch to a telephone visit.  With either a video or telephone visit, we are not always able to ensure that we have a secure connection.   I need to obtain your verbal consent now.   Are you willing to proceed with your visit today?

## 2021-08-12 ENCOUNTER — Telehealth: Payer: Self-pay | Admitting: Gastroenterology

## 2021-08-12 ENCOUNTER — Encounter (HOSPITAL_COMMUNITY): Payer: Self-pay

## 2021-08-12 MED ORDER — FAMOTIDINE 20 MG PO TABS: 20.0000 mg | ORAL_TABLET | Freq: Two times a day (BID) | ORAL | 3 refills | Status: DC

## 2021-08-12 NOTE — Patient Instructions (Addendum)
CT scan to look at your abdomen and blood flow to your intestines.  I have sent in prescription for Famotidine 20mg  twice daily to [facility]. This can take place of Nexium. I was hesitant to prescribe omeprazole or others in the class because of the allergy listed to Dexilant (throat swelling).

## 2021-08-12 NOTE — Telephone Encounter (Signed)
Please send AVS to patient.  Please schedule CTA A/P asap. Let me know when he gets scheduled for.

## 2021-08-12 NOTE — Telephone Encounter (Signed)
CTA scheduled for 1/30 arrival 8:45am, liquids only 4 hrs prior  Pt aware of appt details.

## 2021-08-12 NOTE — Telephone Encounter (Signed)
Noted  

## 2021-08-12 NOTE — Telephone Encounter (Signed)
AVS has been printed and mailed out to the patient.

## 2021-08-14 DIAGNOSIS — R634 Abnormal weight loss: Secondary | ICD-10-CM | POA: Insufficient documentation

## 2021-08-14 DIAGNOSIS — R1031 Right lower quadrant pain: Secondary | ICD-10-CM | POA: Insufficient documentation

## 2021-08-15 ENCOUNTER — Other Ambulatory Visit: Payer: Self-pay

## 2021-08-15 ENCOUNTER — Ambulatory Visit (HOSPITAL_COMMUNITY)
Admission: RE | Admit: 2021-08-15 | Discharge: 2021-08-15 | Disposition: A | Discharge status: Home / Self Care | Payer: Self-pay | Source: Ambulatory Visit | Attending: Gastroenterology | Admitting: Gastroenterology

## 2021-08-15 DIAGNOSIS — R634 Abnormal weight loss: Secondary | ICD-10-CM | POA: Insufficient documentation

## 2021-08-15 DIAGNOSIS — R1031 Right lower quadrant pain: Secondary | ICD-10-CM | POA: Insufficient documentation

## 2021-08-15 DIAGNOSIS — R1084 Generalized abdominal pain: Secondary | ICD-10-CM | POA: Insufficient documentation

## 2021-08-15 MED ORDER — IOHEXOL 350 MG/ML SOLN
100.0000 mL | Freq: Once | INTRAVENOUS | Status: AC | PRN
  Administered 2021-08-15: 100 mL via INTRAVENOUS

## 2021-08-16 ENCOUNTER — Encounter: Payer: Self-pay | Admitting: Physician Assistant

## 2021-08-16 ENCOUNTER — Ambulatory Visit: Payer: Self-pay | Admitting: Physician Assistant

## 2021-08-16 VITALS — BP 120/65 | HR 58 | Temp 97.3°F | Wt 174.0 lb

## 2021-08-16 DIAGNOSIS — I480 Paroxysmal atrial fibrillation: Secondary | ICD-10-CM

## 2021-08-16 DIAGNOSIS — R634 Abnormal weight loss: Secondary | ICD-10-CM

## 2021-08-16 DIAGNOSIS — F17200 Nicotine dependence, unspecified, uncomplicated: Secondary | ICD-10-CM

## 2021-08-16 DIAGNOSIS — E785 Hyperlipidemia, unspecified: Secondary | ICD-10-CM

## 2021-08-16 DIAGNOSIS — I10 Essential (primary) hypertension: Secondary | ICD-10-CM

## 2021-08-16 DIAGNOSIS — K219 Gastro-esophageal reflux disease without esophagitis: Secondary | ICD-10-CM

## 2021-08-16 DIAGNOSIS — E119 Type 2 diabetes mellitus without complications: Secondary | ICD-10-CM

## 2021-08-16 DIAGNOSIS — I2510 Atherosclerotic heart disease of native coronary artery without angina pectoris: Secondary | ICD-10-CM

## 2021-08-16 NOTE — Progress Notes (Signed)
BP 120/65    Pulse (!) 58    Temp (!) 97.3 F (36.3 C)    Wt 174 lb (78.9 kg)    SpO2 96%    BMI 24.27 kg/m    Subjective:    Patient ID: Richard [name], male    DOB: 05/06/1966, 56 y.o.   MRN: [id number]  HPI: Richard [name] is a 56 y.o. male presenting on 08/16/2021 for Diabetes   HPI  Chief Complaint  Patient presents with   Diabetes     DM- a1c 5.8 on 08/03/21.   He has been off the [place] since 07/21/21 appointment.    He has checked his bs some, moslty after eats- still none over 200  In hospital for stroke w/u negative felt to be complicated migraine with dischared 08/04/21.  [name]  He has appointment with neurology 08-25-21  Chronic AF -  last seen by cardiology 06/21/21.. he has Appointment in April to follow up with cardiology  He was Seen by [facility] 08/09/21 for panic d/o  He was seen by GI 08/10/21 for  abdominal pain and weight loss and constipation;  CT was ordered  Pt says he has no other issues besides these things.      Relevant past medical, surgical, family and social history reviewed and updated as indicated. Interim medical history since our last visit reviewed. Allergies and medications reviewed and updated.   Current Outpatient Medications:    albuterol (VENTOLIN HFA) 108 (90 Base) MCG/ACT inhaler, INHALE 2 PUFFS BY MOUTH EVERY 6 HOURS AS NEEDED FOR COUGHING, WHEEZING, OR SHORTNESS OF BREATH, Disp: 13.4 g, Rfl: 1   ALPRAZolam (XANAX) 0.5 MG tablet, Take one twice a day and two at bedtime, Disp: 120 tablet, Rfl: 1   amLODipine (NORVASC) 10 MG tablet, Take 10 mg by mouth daily., Disp: , Rfl:    dronedarone (MULTAQ) 400 MG tablet, Take 1 tablet (400 mg total) by mouth 2 (two) times daily with a meal., Disp: 60 tablet, Rfl: 11   metoprolol succinate (TOPROL XL) 25 MG 24 hr tablet, Take 1 tablet (25 mg total) by mouth daily., Disp: 90 tablet, Rfl: 3   simvastatin (ZOCOR) 20 MG tablet, TAKE 1 TABLET BY MOUTH AT BEDTIME, Disp: 30 tablet, Rfl: 5   XARELTO 20 MG TABS  tablet, TAKE 1 Tablet BY MOUTH ONCE EVERY DAY WITH SUPPER, Disp: 90 tablet, Rfl: 1   esomeprazole (NEXIUM) 40 MG capsule, Take 1 capsule (40 mg total) by mouth 2 (two) times daily before a meal. (Patient not taking: Reported on 08/16/2021), Disp: 60 capsule, Rfl: 5   famotidine (PEPCID) 20 MG tablet, Take 1 tablet (20 mg total) by mouth 2 (two) times daily. (Patient not taking: Reported on 08/16/2021), Disp: 180 tablet, Rfl: 3   JANUVIA 100 MG tablet, TAKE 1 Tablet BY MOUTH ONCE DAILY (Patient not taking: Reported on 08/16/2021), Disp: 90 tablet, Rfl: 1   nitroGLYCERIN (NITROSTAT) 0.4 MG SL tablet, Place 1 tablet (0.4 mg total) under the tongue every 5 (five) minutes x 3 doses as needed for chest pain (if no relief after 3rd dose, proceed to the ED for an evaluation). (Patient not taking: Reported on 08/16/2021), Disp: 25 tablet, Rfl: 3   traZODone (DESYREL) 50 MG tablet, Take 1 tablet (50 mg total) by mouth at bedtime as needed for sleep. (Patient not taking: Reported on 08/16/2021), Disp: 30 tablet, Rfl: 2     Review of Systems  Per HPI unless specifically indicated above  Objective:    BP 120/65    Pulse (!) 58    Temp (!) 97.3 F (36.3 C)    Wt 174 lb (78.9 kg)    SpO2 96%    BMI 24.27 kg/m   Wt Readings from Last 3 Encounters:  08/16/21 174 lb (78.9 kg)  08/02/21 174 lb (78.9 kg)  07/20/21 174 lb (78.9 kg)     Wt 181- 04/26/21 Wt 192- 01/24/21  Physical Exam Vitals reviewed.  Constitutional:      General: He is not in acute distress.    Appearance: He is well-developed. He is not toxic-appearing.  HENT:     Head: Normocephalic and atraumatic.  Cardiovascular:     Rate and Rhythm: Normal rate and regular rhythm.  Pulmonary:     Effort: Pulmonary effort is normal.     Breath sounds: Normal breath sounds. No wheezing.  Abdominal:     General: Bowel sounds are normal.     Palpations: Abdomen is soft.     Tenderness: There is no abdominal tenderness.  Musculoskeletal:      Cervical back: Neck supple.     Right lower leg: No edema.  Lymphadenopathy:     Cervical: No cervical adenopathy.  Skin:    General: Skin is warm and dry.  Neurological:     Mental Status: He is alert and oriented to person, place, and time.  Psychiatric:        Behavior: Behavior normal.          Assessment & Plan:    Encounter Diagnoses  Name Primary?   Diet-controlled diabetes mellitus ([place]) Yes   Tobacco use disorder    Weight loss    Coronary artery disease involving native heart without angina pectoris, unspecified vessel or lesion type    Essential hypertension    Hyperlipidemia, unspecified hyperlipidemia type    Gastroesophageal reflux disease, unspecified whether esophagitis present    Paroxysmal atrial fibrillation ([place])      -Pt to Stay off januvia.  Follow diabetic diet/avoid sodas and sweets -pt to continue with his specialists per their recommendations -will get ct chest in light of weight loss and longstanding smoking -pt says he has cafa -DM foot exam was updated -pt was educated and encouraged to get covid vaccination -pt to follow up in office in 3 months.  He is tocontact office sooner prn

## 2021-08-16 NOTE — Patient Instructions (Addendum)
Contact [facility] to get help with transportation  ----------------------------------------------------------  COVID-19 vaccination significantly lowers your risk of severe illness, hospitalization, and death if you get infected. Compared to people who are up to date with their COVID-19 vaccinations, unvaccinated people aremore likely to get COVID-19, much more likely to be hospitalized with COVID-19, and much more likely to die from COVID-19. Like all vaccines, COVID-19 vaccines are not 100% effective at preventing infection. Some people who are up to date with their COVID-19 vaccinations will get COVID-19 breakthrough infection. However, staying up to date with your COVID-19 vaccinations means that you are less likely to have a breakthrough infection and, if you do get sick, you are less likely to get severely ill or die. Staying up to date with COVID-19 vaccination also means you are less likely to spread the disease to others and increases your protection against new variants of SARS-CoV-2, the virus that causes COVID-19.

## 2021-08-22 ENCOUNTER — Telehealth: Payer: Self-pay

## 2021-08-22 ENCOUNTER — Telehealth: Payer: Self-pay | Admitting: Gastroenterology

## 2021-08-22 ENCOUNTER — Other Ambulatory Visit: Payer: Self-pay | Admitting: *Deleted

## 2021-08-22 DIAGNOSIS — R1084 Generalized abdominal pain: Secondary | ICD-10-CM

## 2021-08-22 MED ORDER — FAMOTIDINE 20 MG PO TABS: 20.0000 mg | ORAL_TABLET | Freq: Two times a day (BID) | ORAL | 3 refills | Status: DC

## 2021-08-22 NOTE — Telephone Encounter (Signed)
RX done. 

## 2021-08-22 NOTE — Telephone Encounter (Signed)
Pt is needing a 90 day supply of famotidine 20 mg sent to [facility]. Medication is unavailable through [facility] and pt can get a 90 day supply with [facility] at [facility] for $20.

## 2021-08-22 NOTE — Telephone Encounter (Signed)
Spoke with pt & informed of CT scheduled 09/28/2021 at 4:30pm.

## 2021-08-22 NOTE — Addendum Note (Signed)
Addended by: Mahala Menghini on: 08/22/2021 04:59 PM   Modules accepted: Orders

## 2021-08-22 NOTE — Telephone Encounter (Signed)
PATIENT WIFE CALLED AND SAID THE PEPSID PRESCRIPTION WAS SENT TO [facility] BUT THEY DO NOT CARRY THAT, PLEASE ADVISE

## 2021-08-24 ENCOUNTER — Ambulatory Visit (INDEPENDENT_AMBULATORY_CARE_PROVIDER_SITE_OTHER): Payer: Self-pay | Admitting: Clinical

## 2021-08-24 ENCOUNTER — Other Ambulatory Visit: Payer: Self-pay

## 2021-08-24 ENCOUNTER — Encounter (HOSPITAL_COMMUNITY): Payer: Self-pay

## 2021-08-24 DIAGNOSIS — F4001 Agoraphobia with panic disorder: Secondary | ICD-10-CM

## 2021-08-24 DIAGNOSIS — F331 Major depressive disorder, recurrent, moderate: Secondary | ICD-10-CM

## 2021-08-24 DIAGNOSIS — F4310 Post-traumatic stress disorder, unspecified: Secondary | ICD-10-CM

## 2021-08-24 DIAGNOSIS — F419 Anxiety disorder, unspecified: Secondary | ICD-10-CM

## 2021-08-24 NOTE — Plan of Care (Signed)
Verbal consent  
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
## 2021-08-25 ENCOUNTER — Other Ambulatory Visit: Payer: Self-pay

## 2021-08-25 ENCOUNTER — Encounter (HOSPITAL_COMMUNITY): Payer: Self-pay

## 2021-08-25 ENCOUNTER — Encounter (HOSPITAL_COMMUNITY)
Admission: RE | Admit: 2021-08-25 | Discharge: 2021-08-25 | Disposition: A | Discharge status: Home / Self Care | Payer: Self-pay | Source: Ambulatory Visit | Attending: Gastroenterology | Admitting: Gastroenterology

## 2021-08-25 ENCOUNTER — Ambulatory Visit: Payer: Self-pay | Admitting: Neurology

## 2021-08-25 DIAGNOSIS — R1084 Generalized abdominal pain: Secondary | ICD-10-CM | POA: Insufficient documentation

## 2021-08-25 MED ORDER — TECHNETIUM TC 99M MEBROFENIN IV KIT
5.0000 | PACK | Freq: Once | INTRAVENOUS | Status: AC | PRN
  Administered 2021-08-25: 5.5 via INTRAVENOUS

## 2021-08-30 ENCOUNTER — Telehealth: Payer: Self-pay

## 2021-08-30 NOTE — Telephone Encounter (Signed)
Pt would also like to find out the results to his hida scan that was done on 08/25/21.

## 2021-08-30 NOTE — Telephone Encounter (Signed)
Pt's wife called stating that the patient has been having a lot more indigestion issues since starting the Pepcid. Pt is wanting to know what else he can do. Please advise.

## 2021-08-30 NOTE — Telephone Encounter (Signed)
See result note.  

## 2021-08-31 ENCOUNTER — Ambulatory Visit: Payer: Self-pay | Admitting: Gastroenterology

## 2021-09-05 ENCOUNTER — Telehealth: Payer: Self-pay | Admitting: Internal Medicine

## 2021-09-05 ENCOUNTER — Encounter: Payer: Self-pay | Admitting: Internal Medicine

## 2021-09-05 NOTE — Telephone Encounter (Signed)
PLEASE CALL PATIENT, HE IS STILL HAVING ISSUES WITH REFLUX

## 2021-09-06 ENCOUNTER — Telehealth (INDEPENDENT_AMBULATORY_CARE_PROVIDER_SITE_OTHER): Payer: Self-pay | Admitting: Psychiatry

## 2021-09-06 ENCOUNTER — Encounter (HOSPITAL_COMMUNITY): Payer: Self-pay | Admitting: Psychiatry

## 2021-09-06 ENCOUNTER — Other Ambulatory Visit: Payer: Self-pay

## 2021-09-06 DIAGNOSIS — F4001 Agoraphobia with panic disorder: Secondary | ICD-10-CM

## 2021-09-06 DIAGNOSIS — F419 Anxiety disorder, unspecified: Secondary | ICD-10-CM

## 2021-09-06 DIAGNOSIS — F331 Major depressive disorder, recurrent, moderate: Secondary | ICD-10-CM

## 2021-09-06 MED ORDER — ALPRAZOLAM 0.5 MG PO TABS: ORAL_TABLET | ORAL | 2 refills | Status: DC

## 2021-09-06 NOTE — Progress Notes (Signed)
Virtual Visit via Telephone Note  I connected with Richard [name] on 09/06/21 at 10:00 AM EST by telephone and verified that I am speaking with the correct person using two identifiers.  Location: Patient: home Provider: office   I discussed the limitations, risks, security and privacy concerns of performing an evaluation and management service by telephone and the availability of in person appointments. I also discussed with the patient that there may be a patient responsible charge related to this service. The patient expressed understanding and agreed to proceed.      I discussed the assessment and treatment plan with the patient. The patient was provided an opportunity to ask questions and all were answered. The patient agreed with the plan and demonstrated an understanding of the instructions.   The patient was advised to call back or seek an in-person evaluation if the symptoms worsen or if the condition fails to improve as anticipated.  I provided 15 minutes of non-face-to-face time during this encounter.   [name], MD  [facility] MD/PA/NP OP Progress Note  09/06/2021 10:21 AM ABU HEAVIN  MRN:  [id number]  Chief Complaint:  Chief Complaint  Patient presents with   Anxiety   Follow-up   HPI: This patient is a 56 year old Caucasian male who lives with his wife in [place].  He is unemployed.  The patient returns for follow-up after 4 weeks.  Last time he had been hospitalized for presumed stroke.  While he was hospitalized the Xanax was discontinued he.  He did become very distraught anxious and unable to sleep.  I have reinstated the Xanax and he is doing better.  He is still very stressed about his medical issues.  He has gastric reflux and everything he eats tends to upset him.  He is lost quite a bit of weight and is down to 165 pounds by his report.  He started out over 200 pounds.  GI had sent him a diet through MyChart but he is not able to access it and I urged him  to call the my chart team to get help with this.  In terms of mood he states he is depressed about his medical issues but not suicidal.  His anxiety is under better control and he is sleeping better.  He states that his appetite is good but when he eats it makes him sick so he is scared to eat a lot of foods.  The diet seems to suggest sticking with high-protein lean meats avoiding fatty foods and asked acidic foods and we went over this again today.  I discussed the idea of adding mirtazapine at bedtime to help with sleep appetite and anxiety but he is "scared to try anything new."  He would just like to stick with the Xanax Visit Diagnosis:    ICD-10-CM   1. 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 Recurrent moderate major depressive disorder with anxiety (Richard [name])  F33.1    F41.9       Past Psychiatric History: none  Past Medical History:  Past Medical History:  Diagnosis Date   Arthritis    Asthma    CAD (coronary artery disease)    a. Cardiac cath 07/2015 showed 65% distal Cx, 20% mid-distal LAD, 20% prox-distal RCA, EF 60%, EDP 12mmHg.   Colitis 1990   COPD (chronic obstructive pulmonary disease) ([place])    Depression    Essential hypertension    Gastric ulcer 2003; 2012  2003: + esophagitis; negative H.pylori serology  2012: Dr. Oneida Alar, mild gastritis, Bravo PH probe placement, negative H.pylori   GERD (gastroesophageal reflux disease)    Hepatic steatosis    History of hiatal hernia    Hyperlipemia    Overweight    Panic attacks    Paroxysmal atrial fibrillation ([place])    Stroke ([place])    TIA (transient ischemic attack)    Type II diabetes mellitus ([place])     Past Surgical History:  Procedure Laterality Date   BALLOON DILATION N/A 07/25/2021   Procedure: BALLOON DILATION;  Surgeon: Eloise Harman, DO;  [facility];  Service: Endoscopy;  Laterality: N/A;   BIOPSY  07/25/2021   Procedure: BIOPSY;  Surgeon: Eloise Harman, DO;   [facility];  Service: Endoscopy;;   BRAVO [place] STUDY  05/03/2011   JQB:HALP gastritis/normal esophagus and duodenum   CARDIAC CATHETERIZATION  1990s X 1; 2005; 08/12/2015   CARDIAC CATHETERIZATION N/A 08/12/2015   Procedure: Left Heart Cath and Coronary Angiography;  Surgeon: Belva Crome, MD; LAD 20%, CFX 65%, RCA 20%, EF 60%    COLONOSCOPY  1990   COLONOSCOPY WITH PROPOFOL N/A 11/21/2016   Dr. Oneida Alar: non-thrombosed external hemorrhoids, one 6 mm polyp (polypoid lesion), internal hemorrhoids. TI Normal. 10 years screening   ESOPHAGOGASTRODUODENOSCOPY  05/03/2011   FXT:KWIO gastritis   ESOPHAGOGASTRODUODENOSCOPY (EGD) WITH PROPOFOL N/A 07/25/2021   Procedure: ESOPHAGOGASTRODUODENOSCOPY (EGD) WITH PROPOFOL;  Surgeon: Eloise Harman, DO;  [facility];  Service: Endoscopy;  Laterality: N/A;  1:30pm   NECK MASS EXCISION Right    "done in dr's office; behind right ear/side of ncek"   POLYPECTOMY  11/21/2016   Procedure: POLYPECTOMY;  Surgeon: [name], MD;  [facility];  Service: Endoscopy;;  descending colon polyp   SHOULDER ARTHROSCOPY W/ ROTATOR CUFF REPAIR Right 2006   acromioclavicular joint arthrosis    Family Psychiatric History: see below  Family History:  Family History  Problem Relation Age of Onset   Lung cancer Mother    Alcohol abuse Mother    Heart attack Father 2   Diabetes Father    Alcohol abuse Father    Hypertension Brother    Hypertension Brother    Anxiety disorder Sister    Depression Sister    Anxiety disorder Sister    Heart attack Brother 77   Diabetes Brother    Hypertension Brother    Seizures Brother    Dementia Paternal Uncle    Dementia Cousin    ADD / ADHD Daughter    Colon cancer Neg Hx    Drug abuse Neg Hx    Bipolar disorder Neg Hx    OCD Neg Hx    Paranoid behavior Neg Hx    Schizophrenia Neg Hx    Sexual abuse Neg Hx    Physical abuse Neg Hx     Social History:  Social History    Socioeconomic History   Marital status: Married    Spouse name: Not on file   Number of children: Not on file   Years of education: Not on file   Highest education level: Not on file  Occupational History   Occupation: full time    Employer: UNEMPLOYED  Tobacco Use   Smoking status: Every Day    Packs/day: 0.25    Years: 25.00    Pack years: 6.25    Types: Cigarettes    Start date: 07/17/1982   Smokeless tobacco:  Never   Tobacco comments:    4-6 cigarettes daily 04/27/2021  Vaping Use   Vaping Use: Never used  Substance and Sexual Activity   Alcohol use: No    Alcohol/week: 0.0 standard drinks   Drug use: No   Sexual activity: Yes    Birth control/protection: None  Other Topics Concern   Not on file  Social History Narrative   Pt lives in [place] with wife.  5 children.  Unemployed due to panic attacks and back pain   Social Determinants of Health   Financial Resource Strain: Not on file  Food Insecurity: Not on file  Transportation Needs: Not on file  Physical Activity: Not on file  Stress: Not on file  Social Connections: Not on file    Allergies:  Allergies  Allergen Reactions   Dexilant [Dexlansoprazole] Anaphylaxis   Mushroom Ext Cmplx(Shiitake-Reishi-Mait) Anaphylaxis    Rapid heart rate.   Penicillins Anaphylaxis    Has patient had a PCN reaction causing immediate rash, facial/tongue/throat swelling, SOB or lightheadedness with hypotension: Yes Has patient had a PCN reaction causing severe rash involving mucus membranes or skin necrosis: No Has patient had a PCN reaction that required hospitalization Yes Has patient had a PCN reaction occurring within the last 10 years: No If all of the above answers are "NO", then may proceed with Cephalosporin use.    Doxycycline Nausea And Vomiting         Metabolic Disorder Labs: Lab Results  Component Value Date   HGBA1C 5.8 (H) 08/03/2021   MPG 119.76 08/03/2021   MPG 131.24 04/25/2021   No  results found for: PROLACTIN Lab Results  Component Value Date   CHOL 94 08/03/2021   TRIG 46 08/03/2021   HDL 35 (L) 08/03/2021   CHOLHDL 2.7 08/03/2021   VLDL 9 08/03/2021   LDLCALC 50 08/03/2021   LDLCALC 59 04/25/2021   Lab Results  Component Value Date   TSH 1.230 07/20/2021   TSH 3.119 10/01/2020    Therapeutic Level Labs: No results found for: LITHIUM No results found for: VALPROATE No components found for:  CBMZ  Current Medications: Current Outpatient Medications  Medication Sig Dispense Refill   albuterol (VENTOLIN HFA) 108 (90 Base) MCG/ACT inhaler INHALE 2 PUFFS BY MOUTH EVERY 6 HOURS AS NEEDED FOR COUGHING, WHEEZING, OR SHORTNESS OF BREATH 13.4 g 1   ALPRAZolam (XANAX) 0.5 MG tablet Take one twice a day and two at bedtime 120 tablet 2   amLODipine (NORVASC) 10 MG tablet Take 10 mg by mouth daily.     dronedarone (MULTAQ) 400 MG tablet Take 1 tablet (400 mg total) by mouth 2 (two) times daily with a meal. 60 tablet 11   esomeprazole (NEXIUM) 40 MG capsule Take 1 capsule (40 mg total) by mouth 2 (two) times daily before a meal. (Patient not taking: Reported on 08/16/2021) 60 capsule 5   famotidine (PEPCID) 20 MG tablet Take 1 tablet (20 mg total) by mouth 2 (two) times daily. 180 tablet 3   metoprolol succinate (TOPROL XL) 25 MG 24 hr tablet Take 1 tablet (25 mg total) by mouth daily. 90 tablet 3   nitroGLYCERIN (NITROSTAT) 0.4 MG SL tablet Place 1 tablet (0.4 mg total) under the tongue every 5 (five) minutes x 3 doses as needed for chest pain (if no relief after 3rd dose, proceed to the ED for an evaluation). (Patient not taking: Reported on 08/16/2021) 25 tablet 3   simvastatin (ZOCOR) 20 MG tablet TAKE 1  TABLET BY MOUTH AT BEDTIME 30 tablet 5   XARELTO 20 MG TABS tablet TAKE 1 Tablet BY MOUTH ONCE EVERY DAY WITH SUPPER 90 tablet 1   No current facility-administered medications for this visit.     Musculoskeletal: Strength & Muscle Tone: na Gait & Station:  na Patient leans: N/A  Psychiatric Specialty Exam: Review of Systems  Constitutional:  Positive for unexpected weight change.  Cardiovascular:  Positive for palpitations.  Gastrointestinal:  Positive for abdominal pain and nausea.  Psychiatric/Behavioral:  The patient is nervous/anxious.   All other systems reviewed and are negative.  There were no vitals taken for this visit.There is no height or weight on file to calculate BMI.  General Appearance: NA  Eye Contact:  NA  Speech:  Clear and Coherent  Volume:  Normal  Mood:  Anxious  Affect:  NA  Thought Process:  Goal Directed  Orientation:  Full (Time, Place, and Person)  Thought Content: Rumination   Suicidal Thoughts:  No  Homicidal Thoughts:  No  Memory:  Immediate;   Good Recent;   Good Remote;   Fair  Judgement:  Good  Insight:  Fair  Psychomotor Activity:  Decreased  Concentration:  Concentration: Fair and Attention Span: Fair  Recall:  Good  Fund of Knowledge: Good  Language: Good  Akathisia:  No  Handed:  Right  AIMS (if indicated): not done  Assets:  Communication Skills Desire for Improvement Resilience Social Support Talents/Skills  ADL's:  Intact  Cognition: WNL  Sleep:  Good   Screenings: GAD-7    Flowsheet Row Counselor from 08/24/2021 in [facility]  Total GAD-7 Score 20      PHQ2-9    Flowsheet Row Video Visit from 09/06/2021 in [place] Counselor from 08/24/2021 in [place] Video Visit from 06/16/2021 in [place] Patient Outreach Telephone from 08/30/2015 in [place] Patient Outreach Telephone from 08/26/2015 in [place]  PHQ-2 Total Score 5 4 1 2 4   PHQ-9 Total Score 12 16 -- 8 8      Flowsheet Row Video Visit from 09/06/2021 in [place]  Counselor from 08/24/2021 in [place] ED to Hosp-Admission (Discharged) from 08/02/2021 in [place] No Risk No Risk No Risk        Assessment and Plan: This patient is a 56 year old male with a history of anxiety and panic attacks.  He seems to be doing better now that he is reinstated on the Xanax.  He still has depressive symptoms but does not want to take any other medications.  He has started therapy and he is not suicidal.  He will continue the Xanax 0.5 mg twice daily and 1 mg at bedtime for anxiety and sleep.  He never has to use the trazodone.  He will return to see me in 3 months  Collaboration of Care: Collaboration of Care: Primary Care Provider AEB chart notes available to primary physician and other specialist to the epic system  Patient/Guardian was advised Release of Information must be obtained prior to any record release in order to collaborate their care with an outside provider. Patient/Guardian was advised if they have not already done so to contact the registration department to sign all necessary forms in order for [name] to release information regarding their care.   Consent: Patient/Guardian gives verbal consent for treatment and  assignment of benefits for services provided during this visit. Patient/Guardian expressed understanding and agreed to proceed.    [name], MD 09/06/2021, 10:21 AM

## 2021-09-06 NOTE — Telephone Encounter (Signed)
Pt states that he is still having burning up in his throat. Pt states that he is taking Pepcid 3 times a day and is also using Maalox in between. Pt states that he is still following a GERD diet as well. Pt has an appt with Dr. Abbey Chatters on 10/19/21.

## 2021-09-06 NOTE — Telephone Encounter (Signed)
Patient has an appt tomorrow with Dr. Abbey Chatters. Make sure he is aware. They can discuss if they want to try a different PPI (even with history of anaphylaxis to dexilant)  I noticed PCP has ordered chest CT for next month. I agree with need to cover this basis given pain in the chest after meals, weight loss. EGD really did not explain his significant symptoms.

## 2021-09-06 NOTE — Telephone Encounter (Signed)
Pt was made aware and verbalized understanding.  

## 2021-09-07 ENCOUNTER — Other Ambulatory Visit: Payer: Self-pay

## 2021-09-07 ENCOUNTER — Ambulatory Visit (INDEPENDENT_AMBULATORY_CARE_PROVIDER_SITE_OTHER): Payer: Self-pay | Admitting: Internal Medicine

## 2021-09-07 ENCOUNTER — Encounter: Payer: Self-pay | Admitting: Internal Medicine

## 2021-09-07 VITALS — BP 141/85 | HR 59 | Temp 97.5°F | Ht 71.0 in | Wt 171.4 lb

## 2021-09-07 DIAGNOSIS — K219 Gastro-esophageal reflux disease without esophagitis: Secondary | ICD-10-CM

## 2021-09-07 DIAGNOSIS — K5904 Chronic idiopathic constipation: Secondary | ICD-10-CM

## 2021-09-07 DIAGNOSIS — K649 Unspecified hemorrhoids: Secondary | ICD-10-CM

## 2021-09-07 MED ORDER — ESOMEPRAZOLE MAGNESIUM 40 MG PO CPDR
40.0000 mg | DELAYED_RELEASE_CAPSULE | Freq: Two times a day (BID) | ORAL | 5 refills | Status: DC
Start: 2021-09-07 — End: 2021-11-17

## 2021-09-07 NOTE — Progress Notes (Signed)
Referring Provider: [name], PA-C Primary Care Physician:  [name], PA-C Primary GI:  Dr. Abbey Chatters  Chief Complaint  Patient presents with   Weight Loss    Has lost about 60 lbs in past 8 months. Eating bland diet   Abdominal Pain    Mid abd and lower bad   Gastroesophageal Reflux    Pepcid TID not working. Has burning in throat. Takes liquid OTC antacids frequently    HPI:   Richard [name] is a 56 y.o. male who presents to clinic today for follow-up visit. He has history of chronic GERD, abdominal pain, dysphagia, constipation, weight loss. 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    EGD 07/2021: small hiatal hernia, abnormal esophageal motility, iron pill gastritis, no h.pylori, mucosal variant in duodenum (bx with focal lymphangiectasia, neg for celiac). He was also hospitalized January 2023 with suspicion of stroke but CT and MRI unremarkable. Patient advised to stop Xanax. Patient states he thinks his symptoms were due to anxiety/panic attack. Neuro consulted, ?TIAs or complicated migraine.  CT angiogram of the abdomen pelvis 08/15/2021 largely unremarkable from a GI standpoint.  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  Has been referred to vascular.  HIDA scan to 08/25/2021 showed gallbladder ejection fraction of 33%, borderline.  Today, states he continues to have issues with breakthrough reflux symptoms and epigastric pain.  Currently taking famotidine 3 times daily without relief in his symptoms.  Wishes to go back on Nexium.  Does have an allergy to [place] in the past with throat swelling.  States he tolerated Nexium for many years.  Also with complaints of constipation.  States he was on MiraLAX previously which was helping keep his bowels regular but he was told to stop this by someone in the hospital?  Also wishes to go back on his Anusol cream for external  hemorrhoids.    Last colonoscopy 2018, 1 benign polyp removed, recommended 10-year recall.  Past Medical History:  Diagnosis Date   Arthritis    Asthma    CAD (coronary artery disease)    a. Cardiac cath 07/2015 showed 65% distal Cx, 20% mid-distal LAD, 20% prox-distal RCA, EF 60%, EDP 83mmHg.   Colitis 1990   COPD (chronic obstructive pulmonary disease) ([place])    Depression    Essential hypertension    Gastric ulcer 2003; 2012   2003: + esophagitis; negative H.pylori serology  2012: Dr. Oneida Alar, mild gastritis, Bravo PH probe placement, negative H.pylori   GERD (gastroesophageal reflux disease)    Hepatic steatosis    History of hiatal hernia    Hyperlipemia    Overweight    Panic attacks    Paroxysmal atrial fibrillation ([place])    Stroke ([place])    TIA (transient ischemic attack)    Type II diabetes mellitus ([place])     Past Surgical History:  Procedure Laterality Date   BALLOON DILATION N/A 07/25/2021   Procedure: BALLOON DILATION;  Surgeon: Eloise Harman, DO;  [facility];  Service: Endoscopy;  Laterality: N/A;   BIOPSY  07/25/2021   Procedure: BIOPSY;  Surgeon: Eloise Harman, DO;  [facility];  Service: Endoscopy;;   BRAVO [place] STUDY  05/03/2011   WEX:HBZJ gastritis/normal esophagus and duodenum   CARDIAC CATHETERIZATION  1990s X 1; 2005; 08/12/2015   CARDIAC CATHETERIZATION N/A 08/12/2015   Procedure: Left Heart Cath and Coronary Angiography;  Surgeon: Belva Crome, MD;  LAD 20%, CFX 65%, RCA 20%, EF 60%    COLONOSCOPY  1990   COLONOSCOPY WITH PROPOFOL N/A 11/21/2016   Dr. Oneida Alar: non-thrombosed external hemorrhoids, one 6 mm polyp (polypoid lesion), internal hemorrhoids. TI Normal. 10 years screening   ESOPHAGOGASTRODUODENOSCOPY  05/03/2011   ZWC:HENI gastritis   ESOPHAGOGASTRODUODENOSCOPY (EGD) WITH PROPOFOL N/A 07/25/2021   Procedure: ESOPHAGOGASTRODUODENOSCOPY (EGD) WITH PROPOFOL;  Surgeon: Eloise Harman, DO;  [facility];  Service:  Endoscopy;  Laterality: N/A;  1:30pm   NECK MASS EXCISION Right    "done in dr's office; behind right ear/side of ncek"   POLYPECTOMY  11/21/2016   Procedure: POLYPECTOMY;  Surgeon: [name], MD;  [facility];  Service: Endoscopy;;  descending colon polyp   SHOULDER ARTHROSCOPY W/ ROTATOR CUFF REPAIR Right 2006   acromioclavicular joint arthrosis    Current Outpatient Medications  Medication Sig Dispense Refill   albuterol (VENTOLIN HFA) 108 (90 Base) MCG/ACT inhaler INHALE 2 PUFFS BY MOUTH EVERY 6 HOURS AS NEEDED FOR COUGHING, WHEEZING, OR SHORTNESS OF BREATH 13.4 g 1   ALPRAZolam (XANAX) 0.5 MG tablet Take one twice a day and two at bedtime 120 tablet 2   amLODipine (NORVASC) 10 MG tablet Take 10 mg by mouth daily.     dronedarone (MULTAQ) 400 MG tablet Take 1 tablet (400 mg total) by mouth 2 (two) times daily with a meal. 60 tablet 11   famotidine (PEPCID) 20 MG tablet Take 1 tablet (20 mg total) by mouth 2 (two) times daily. (Patient taking differently: Take 20 mg by mouth 3 (three) times daily.) 180 tablet 3   metoprolol succinate (TOPROL XL) 25 MG 24 hr tablet Take 1 tablet (25 mg total) by mouth daily. 90 tablet 3   nitroGLYCERIN (NITROSTAT) 0.4 MG SL tablet Place 1 tablet (0.4 mg total) under the tongue every 5 (five) minutes x 3 doses as needed for chest pain (if no relief after 3rd dose, proceed to the ED for an evaluation). 25 tablet 3   simvastatin (ZOCOR) 20 MG tablet TAKE 1 TABLET BY MOUTH AT BEDTIME 30 tablet 5   XARELTO 20 MG TABS tablet TAKE 1 Tablet BY MOUTH ONCE EVERY DAY WITH SUPPER 90 tablet 1   esomeprazole (NEXIUM) 40 MG capsule Take 1 capsule (40 mg total) by mouth 2 (two) times daily before a meal. 60 capsule 5   No current facility-administered medications for this visit.    Allergies as of 09/07/2021 - Review Complete 09/07/2021  Allergen Reaction Noted   Dexilant [dexlansoprazole] Anaphylaxis 01/20/2015   Mushroom ext  cmplx(shiitake-reishi-mait) Anaphylaxis 03/22/2011   Penicillins Anaphylaxis    Doxycycline Nausea And Vomiting 04/18/2016    Family History  Problem Relation Age of Onset   Lung cancer Mother    Alcohol abuse Mother    Heart attack Father 64   Diabetes Father    Alcohol abuse Father    Hypertension Brother    Hypertension Brother    Anxiety disorder Sister    Depression Sister    Anxiety disorder Sister    Heart attack Brother 31   Diabetes Brother    Hypertension Brother    Seizures Brother    Dementia Paternal Uncle    Dementia Cousin    ADD / ADHD Daughter    Colon cancer Neg Hx    Drug abuse Neg Hx    Bipolar disorder Neg Hx    OCD Neg Hx    Paranoid behavior Neg Hx  Schizophrenia Neg Hx    Sexual abuse Neg Hx    Physical abuse Neg Hx     Social History   Socioeconomic History   Marital status: Married    Spouse name: Not on file   Number of children: Not on file   Years of education: Not on file   Highest education level: Not on file  Occupational History   Occupation: full time    Employer: UNEMPLOYED  Tobacco Use   Smoking status: Every Day    Packs/day: 0.25    Years: 25.00    Pack years: 6.25    Types: Cigarettes    Start date: 07/17/1982   Smokeless tobacco: Never   Tobacco comments:    4-6 cigarettes daily 04/27/2021  Vaping Use   Vaping Use: Never used  Substance and Sexual Activity   Alcohol use: No    Alcohol/week: 0.0 standard drinks   Drug use: No   Sexual activity: Yes    Birth control/protection: None  Other Topics Concern   Not on file  Social History Narrative   Pt lives in [place] with wife.  5 children.  Unemployed due to panic attacks and back pain   Social Determinants of Health   Financial Resource Strain: Not on file  Food Insecurity: Not on file  Transportation Needs: Not on file  Physical Activity: Not on file  Stress: Not on file  Social Connections: Not on file    Subjective: Review of Systems   Constitutional:  Negative for chills and fever.  HENT:  Negative for congestion and hearing loss.   Eyes:  Negative for blurred vision and double vision.  Respiratory:  Negative for cough and shortness of breath.   Cardiovascular:  Negative for chest pain and palpitations.  Gastrointestinal:  Positive for constipation and heartburn. Negative for abdominal pain, blood in stool, diarrhea, melena and vomiting.  Genitourinary:  Negative for dysuria and urgency.  Musculoskeletal:  Negative for joint pain and myalgias.  Skin:  Negative for itching and rash.  Neurological:  Negative for dizziness and headaches.  Psychiatric/Behavioral:  Negative for depression. The patient is not nervous/anxious.     Objective: BP (!) 141/85    Pulse (!) 59    Temp (!) 97.5 F (36.4 C) (Temporal)    Ht 5\' 11"  (1.803 m)    Wt 171 lb 6.4 oz (77.7 kg)    BMI 23.91 kg/m  Physical Exam Constitutional:      Appearance: Normal appearance.  HENT:     Head: Normocephalic and atraumatic.  Eyes:     Extraocular Movements: Extraocular movements intact.     Conjunctiva/sclera: Conjunctivae normal.  Cardiovascular:     Rate and Rhythm: Normal rate and regular rhythm.  Pulmonary:     Effort: Pulmonary effort is normal.     Breath sounds: Normal breath sounds.  Abdominal:     General: Bowel sounds are normal.     Palpations: Abdomen is soft.  Musculoskeletal:        General: Normal range of motion.     Cervical back: Normal range of motion and neck supple.  Skin:    General: Skin is warm.  Neurological:     General: No focal deficit present.     Mental Status: He is alert and oriented to person, place, and time.  Psychiatric:        Mood and Affect: Mood normal.        Behavior: Behavior normal.     Assessment: *  Chronic GERD-not well controlled on Pepcid *Chronic idiopathic constipation *External hemorrhoids  Plan: Discussed patient's care in depth with him today.  Symptoms not well controlled on  Pepcid.  Wishes to go back on Nexium.  Reportedly had issues with affordability in the past but he states he wants to try to get back on this medication.  We will send in prescription today.    Has history of throat swelling on Dexilant but states he has tolerated Nexium for many years.  Could consider switching to a different PPI such as omeprazole if unable to afford Nexium and patient is willing to take risk of potential allergic reaction.  I think this would be small given that he has tolerated Nexium for so long.  For constipation I recommend he go back on MiraLAX 1 capful daily.   If this is not adequate then increase to 2 capfuls daily.  If this is still not adequate then add on once daily Dulcolax.  Patient did not tolerate fiber therapy in the past.  Encouraged to drink at least 6 glasses of water daily.  Colonoscopy for colon cancer screening purposes due 2028.  Follow-up in 4 months  09/07/2021 1:56 PM   Disclaimer: This note was dictated with voice recognition software. Similar sounding words can inadvertently be transcribed and may not be corrected upon review.

## 2021-09-07 NOTE — Patient Instructions (Signed)
For your chronic heartburn, I will send in prescription for Nexium twice daily.  You can take your famotidine on top of this as needed for breakthrough symptoms.  I want you to restart taking your MiraLAX 1 capful daily for constipation.  If this is not adequate then you can increase to 2 capfuls daily.  If this is still not adequate you can add on once daily Dulcolax.  Okay to resume using your rectal cream for hemorrhoids.  If you need refills then just let us know.  Otherwise follow-up in 3 to 4 months.  It was nice seeing you again today.  Dr. Abbey Chatters  At [facility] we value your feedback. You may receive a survey about your visit today. Please share your experience as we strive to create trusting relationships with our patients to provide genuine, compassionate, quality care.  We appreciate your understanding and patience as we review any laboratory studies, imaging, and other diagnostic tests that are ordered as we care for you. Our office policy is 5 business days for review of these results, and any emergent or urgent results are addressed in a timely manner for your best interest. If you do not hear from our office in 1 week, please contact us.   We also encourage the use of MyChart, which contains your medical information for your review as well. If you are not enrolled in this feature, an access code is on this after visit summary for your convenience. Thank you for allowing [name] to be involved in your care.  It was great to see you today!  I hope you have a great rest of your Winter!    Elon Alas. Abbey Chatters, D.O. Gastroenterology and Hepatology [facility]

## 2021-09-07 NOTE — Progress Notes (Signed)
Referring Provider: [name], PA-C Primary Care Physician:  [name], PA-C Primary GI:  Dr. Abbey Chatters  Chief Complaint  Patient presents with   Weight Loss    Has lost about 60 lbs in past 8 months. Eating bland diet   Abdominal Pain    Mid abd and lower bad   Gastroesophageal Reflux    Pepcid TID not working. Has burning in throat. Takes liquid OTC antacids frequently    HPI:   Richard [name] is a 56 y.o. male who presents   Past Medical History:  Diagnosis Date   Arthritis    Asthma    CAD (coronary artery disease)    a. Cardiac cath 07/2015 showed 65% distal Cx, 20% mid-distal LAD, 20% prox-distal RCA, EF 60%, EDP 55mmHg.   Colitis 1990   COPD (chronic obstructive pulmonary disease) ([place])    Depression    Essential hypertension    Gastric ulcer 2003; 2012   2003: + esophagitis; negative H.pylori serology  2012: Dr. Oneida Alar, mild gastritis, Bravo PH probe placement, negative H.pylori   GERD (gastroesophageal reflux disease)    Hepatic steatosis    History of hiatal hernia    Hyperlipemia    Overweight    Panic attacks    Paroxysmal atrial fibrillation ([place])    Stroke ([place])    TIA (transient ischemic attack)    Type II diabetes mellitus ([place])     Past Surgical History:  Procedure Laterality Date   BALLOON DILATION N/A 07/25/2021   Procedure: BALLOON DILATION;  Surgeon: Eloise Harman, DO;  [facility];  Service: Endoscopy;  Laterality: N/A;   BIOPSY  07/25/2021   Procedure: BIOPSY;  Surgeon: Eloise Harman, DO;  [facility];  Service: Endoscopy;;   BRAVO [place] STUDY  05/03/2011   MGN:OIBB gastritis/normal esophagus and duodenum   CARDIAC CATHETERIZATION  1990s X 1; 2005; 08/12/2015   CARDIAC CATHETERIZATION N/A 08/12/2015   Procedure: Left Heart Cath and Coronary Angiography;  Surgeon: Belva Crome, MD; LAD 20%, CFX 65%, RCA 20%, EF 60%    COLONOSCOPY  1990   COLONOSCOPY WITH PROPOFOL N/A 11/21/2016   Dr. Oneida Alar:  non-thrombosed external hemorrhoids, one 6 mm polyp (polypoid lesion), internal hemorrhoids. TI Normal. 10 years screening   ESOPHAGOGASTRODUODENOSCOPY  05/03/2011   CWU:GQBV gastritis   ESOPHAGOGASTRODUODENOSCOPY (EGD) WITH PROPOFOL N/A 07/25/2021   Procedure: ESOPHAGOGASTRODUODENOSCOPY (EGD) WITH PROPOFOL;  Surgeon: Eloise Harman, DO;  [facility];  Service: Endoscopy;  Laterality: N/A;  1:30pm   NECK MASS EXCISION Right    "done in dr's office; behind right ear/side of ncek"   POLYPECTOMY  11/21/2016   Procedure: POLYPECTOMY;  Surgeon: [name], MD;  [facility];  Service: Endoscopy;;  descending colon polyp   SHOULDER ARTHROSCOPY W/ ROTATOR CUFF REPAIR Right 2006   acromioclavicular joint arthrosis    Current Outpatient Medications  Medication Sig Dispense Refill   albuterol (VENTOLIN HFA) 108 (90 Base) MCG/ACT inhaler INHALE 2 PUFFS BY MOUTH EVERY 6 HOURS AS NEEDED FOR COUGHING, WHEEZING, OR SHORTNESS OF BREATH 13.4 g 1   ALPRAZolam (XANAX) 0.5 MG tablet Take one twice a day and two at bedtime 120 tablet 2   amLODipine (NORVASC) 10 MG tablet Take 10 mg by mouth daily.     dronedarone (MULTAQ) 400 MG tablet Take 1 tablet (400 mg total) by mouth 2 (two) times daily with a meal. 60 tablet 11   famotidine (PEPCID) 20 MG  tablet Take 1 tablet (20 mg total) by mouth 2 (two) times daily. (Patient taking differently: Take 20 mg by mouth 3 (three) times daily.) 180 tablet 3   metoprolol succinate (TOPROL XL) 25 MG 24 hr tablet Take 1 tablet (25 mg total) by mouth daily. 90 tablet 3   nitroGLYCERIN (NITROSTAT) 0.4 MG SL tablet Place 1 tablet (0.4 mg total) under the tongue every 5 (five) minutes x 3 doses as needed for chest pain (if no relief after 3rd dose, proceed to the ED for an evaluation). 25 tablet 3   simvastatin (ZOCOR) 20 MG tablet TAKE 1 TABLET BY MOUTH AT BEDTIME 30 tablet 5   XARELTO 20 MG TABS tablet TAKE 1 Tablet BY MOUTH ONCE EVERY DAY WITH SUPPER 90  tablet 1   esomeprazole (NEXIUM) 40 MG capsule Take 1 capsule (40 mg total) by mouth 2 (two) times daily before a meal. (Patient not taking: Reported on 08/16/2021) 60 capsule 5   No current facility-administered medications for this visit.    Allergies as of 09/07/2021 - Review Complete 09/07/2021  Allergen Reaction Noted   Dexilant [dexlansoprazole] Anaphylaxis 01/20/2015   Mushroom ext cmplx(shiitake-reishi-mait) Anaphylaxis 03/22/2011   Penicillins Anaphylaxis    Doxycycline Nausea And Vomiting 04/18/2016    Family History  Problem Relation Age of Onset   Lung cancer Mother    Alcohol abuse Mother    Heart attack Father 70   Diabetes Father    Alcohol abuse Father    Hypertension Brother    Hypertension Brother    Anxiety disorder Sister    Depression Sister    Anxiety disorder Sister    Heart attack Brother 55   Diabetes Brother    Hypertension Brother    Seizures Brother    Dementia Paternal Uncle    Dementia Cousin    ADD / ADHD Daughter    Colon cancer Neg Hx    Drug abuse Neg Hx    Bipolar disorder Neg Hx    OCD Neg Hx    Paranoid behavior Neg Hx    Schizophrenia Neg Hx    Sexual abuse Neg Hx    Physical abuse Neg Hx     Social History   Socioeconomic History   Marital status: Married    Spouse name: Not on file   Number of children: Not on file   Years of education: Not on file   Highest education level: Not on file  Occupational History   Occupation: full time    Employer: UNEMPLOYED  Tobacco Use   Smoking status: Every Day    Packs/day: 0.25    Years: 25.00    Pack years: 6.25    Types: Cigarettes    Start date: 07/17/1982   Smokeless tobacco: Never   Tobacco comments:    4-6 cigarettes daily 04/27/2021  Vaping Use   Vaping Use: Never used  Substance and Sexual Activity   Alcohol use: No    Alcohol/week: 0.0 standard drinks   Drug use: No   Sexual activity: Yes    Birth control/protection: None  Other Topics Concern   Not on file   Social History Narrative   Pt lives in [place] with wife.  5 children.  Unemployed due to panic attacks and back pain   Social Determinants of Health   Financial Resource Strain: Not on file  Food Insecurity: Not on file  Transportation Needs: Not on file  Physical Activity: Not on file  Stress: Not on file  Social Connections: Not on file    Subjective: Review of Systems  Constitutional:  Negative for chills and fever.  HENT:  Negative for congestion and hearing loss.   Eyes:  Negative for blurred vision and double vision.  Respiratory:  Negative for cough and shortness of breath.   Cardiovascular:  Negative for chest pain and palpitations.  Gastrointestinal:  Negative for abdominal pain, blood in stool, constipation, diarrhea, heartburn, melena and vomiting.  Genitourinary:  Negative for dysuria and urgency.  Musculoskeletal:  Negative for joint pain and myalgias.  Skin:  Negative for itching and rash.  Neurological:  Negative for dizziness and headaches.  Psychiatric/Behavioral:  Negative for depression. The patient is not nervous/anxious.     Objective: BP (!) 141/85    Pulse (!) 59    Temp (!) 97.5 F (36.4 C) (Temporal)    Ht 5\' 11"  (1.803 m)    Wt 171 lb 6.4 oz (77.7 kg)    BMI 23.91 kg/m  Physical Exam Constitutional:      Appearance: Normal appearance.  HENT:     Head: Normocephalic and atraumatic.  Eyes:     Extraocular Movements: Extraocular movements intact.     Conjunctiva/sclera: Conjunctivae normal.  Cardiovascular:     Rate and Rhythm: Normal rate and regular rhythm.  Pulmonary:     Effort: Pulmonary effort is normal.     Breath sounds: Normal breath sounds.  Abdominal:     General: Bowel sounds are normal.     Palpations: Abdomen is soft.  Musculoskeletal:        General: Normal range of motion.     Cervical back: Normal range of motion and neck supple.  Skin:    General: Skin is warm.  Neurological:     General: No focal deficit present.      Mental Status: He is alert and oriented to person, place, and time.  Psychiatric:        Mood and Affect: Mood normal.        Behavior: Behavior normal.     Assessment: *  Plan:   09/07/2021 1:50 PM   Disclaimer: This note was dictated with voice recognition software. Similar sounding words can inadvertently be transcribed and may not be corrected upon review.

## 2021-09-12 ENCOUNTER — Ambulatory Visit: Payer: Self-pay | Admitting: Neurology

## 2021-09-13 ENCOUNTER — Ambulatory Visit: Payer: Self-pay | Admitting: Internal Medicine

## 2021-09-14 ENCOUNTER — Other Ambulatory Visit: Payer: Self-pay

## 2021-09-14 ENCOUNTER — Ambulatory Visit (INDEPENDENT_AMBULATORY_CARE_PROVIDER_SITE_OTHER): Payer: Self-pay | Admitting: Clinical

## 2021-09-14 DIAGNOSIS — F419 Anxiety disorder, unspecified: Secondary | ICD-10-CM

## 2021-09-14 DIAGNOSIS — F331 Major depressive disorder, recurrent, moderate: Secondary | ICD-10-CM

## 2021-09-14 DIAGNOSIS — F4310 Post-traumatic stress disorder, unspecified: Secondary | ICD-10-CM

## 2021-09-14 DIAGNOSIS — F4001 Agoraphobia with panic disorder: Secondary | ICD-10-CM

## 2021-09-14 NOTE — Progress Notes (Signed)
Virtual Visit via Telephone Note  I connected with [place] on 09/14/21 at 10:00 AM EST by telephone and verified that I am speaking with the correct person using two identifiers.  Location: Patient: Home Provider: Office   I discussed the limitations, risks, security and privacy concerns of performing an evaluation and management service by telephone and the availability of in person appointments. I also discussed with the patient that there may be a patient responsible charge related to this service. The patient expressed understanding and agreed to proceed.  THERAPIST PROGRESS NOTE     Session Time: 10:00 AM-10:30 AM   Participation Level: Active   Behavioral Response: Casual and Alert,Non remarkable   Type of Therapy: Individual Therapy   Treatment Goals addressed: Mood and Anxiety   Interventions: CBT   Summary: Richard [name] is a 56 y.o. male who presents with panic disorder/depression with anxiety/and PTSD . The OPT therapist worked with the patient for his OPT treatment. The OPT therapist utilized Motivational Interviewing to assist in creating therapeutic repore. The patient in the session was engaged and work in collaboration giving feedback about his triggers and symptoms over the past few weeks The patient spoke about his difficulty with transportation and his anxiety primarily in situations where he would need to travel by himself. the patient noted that he will be going with his son this weekend to [facility] to pick up a part to help fix his automobile to allow more access to get out more and not self isolate and be more in the community. The patient verbalized since going back on his medication he has been able to re-regulate his sleep cycle.The OPT therapist utilized Cognitive Behavioral Therapy through cognitive restructuring as well as worked with the patient on coping strategies to assist in management of symptoms as well as reviewed sleep, eating habits, and  general health.    Suicidal/Homicidal: Nowithout intent/plan   Therapist Response:The OPT therapist worked with the patient for the patients scheduled session. The patient was engaged in his session and gave feedback in relation to triggers, symptoms, and behavior responses over the past few weeks. The OPT therapist worked with the patient utilizing an in session Cognitive Behavioral Therapy exercise. The patient was responsive in the session and verbalized, "I usually do okay with getting out if I am with someone else but I cant shake the anxiety feeling when I have to drive by myself and that's a huge trigger". The OPT therapist worked with the patient in this session overviewing his mood and interactions over the past week as well as his adjustment to getting back on some of his health medications. The OPT therapist worked with the patient on coping strategies to assist in management of his mood and anxiety.The OPT therapist will continue treatment work with the patient in his  next scheduled session   Plan: Return again in 2/3 weeks.   Diagnosis:      Axis I: ADHD, combined type  Axis II: No diagnosis       Collaboration of Care: Overview of patient involvement and current treatment with Dr. Harrington Challenger patient psychiatrist for Medication Therapy.   Patient/Guardian was advised Release of Information must be obtained prior to any record release in order to collaborate their care with an outside provider. Patient/Guardian was advised if they have not already done so to contact the registration department to sign all necessary forms in order for [name] to release information regarding their care.    Consent: Patient/Guardian  gives verbal consent for treatment and assignment of benefits for services provided during this visit. Patient/Guardian expressed understanding and agreed to proceed.    I discussed the assessment and treatment plan with the patient. The patient was provided an opportunity to ask  questions and all were answered. The patient agreed with the plan and demonstrated an understanding of the instructions.   The patient was advised to call back or seek an in-person evaluation if the symptoms worsen or if the condition fails to improve as anticipated.   I provided 30 minutes of non-face-to-face time during this encounter.   [name], LCSW  09/14/2021

## 2021-09-20 ENCOUNTER — Telehealth: Payer: Self-pay | Admitting: Internal Medicine

## 2021-09-20 NOTE — Telephone Encounter (Signed)
Patient wife called asking to speak to a nurse about patient vitamin d and b he is supposed to take, she said that he is in range according to his labs and wants to make sure he is still supposed to take it  ?

## 2021-09-21 NOTE — Telephone Encounter (Signed)
Wife was informed of Richard [name]'s recommendation from his December visit.  ?

## 2021-09-26 ENCOUNTER — Telehealth: Payer: Self-pay | Admitting: Internal Medicine

## 2021-09-26 NOTE — Telephone Encounter (Signed)
? ?  Pt c/o medication issue: ? ?1. Name of Medication: dronedarone (MULTAQ) 400 MG tablet ? ?2. How are you currently taking this medication (dosage and times per day)? Take 1 tablet (400 mg total) by mouth 2 (two) times daily with a meal. ? ?3. Are you having a reaction (difficulty breathing--STAT)?  ? ?4. What is your medication issue? Pt is almost out of meds, they said they get multaq at the office every time the pt's needs it  ?

## 2021-09-26 NOTE — Telephone Encounter (Signed)
Spoke with pt who is enrolled in [employer] patient assistance program for [employer]. Pt states that he only has enough to get him to Thursday. Will provide 2 sample boxes ( 6 day) supply. Lot # [id number], Exp: 5/ 2025. Pt will stop by office to complete new patient assistance form and pick up samples on tomorrow.  ?

## 2021-09-27 ENCOUNTER — Ambulatory Visit
Admission: RE | Admit: 2021-09-27 | Discharge: 2021-09-27 | Disposition: A | Discharge status: Home / Self Care | Payer: Self-pay | Source: Ambulatory Visit | Attending: Urgent Care | Admitting: Urgent Care

## 2021-09-27 ENCOUNTER — Telehealth: Payer: Self-pay | Admitting: *Deleted

## 2021-09-27 ENCOUNTER — Other Ambulatory Visit: Payer: Self-pay

## 2021-09-27 VITALS — BP 145/78 | HR 69 | Temp 98.2°F

## 2021-09-27 DIAGNOSIS — I519 Heart disease, unspecified: Secondary | ICD-10-CM

## 2021-09-27 DIAGNOSIS — Z8673 Personal history of transient ischemic attack (TIA), and cerebral infarction without residual deficits: Secondary | ICD-10-CM

## 2021-09-27 DIAGNOSIS — R0789 Other chest pain: Secondary | ICD-10-CM

## 2021-09-27 DIAGNOSIS — H539 Unspecified visual disturbance: Secondary | ICD-10-CM

## 2021-09-27 NOTE — Discharge Instructions (Signed)
Please report to the hospital now as you are in need of a higher level of care than we can provide in the urgent care setting. You need more testing to make sure your heart is okay and that you are not having a heart event. You will also need consideration for a head CT scan given your history of stroke and your vision change from today.  ?

## 2021-09-27 NOTE — ED Provider Notes (Signed)
?[place] ? ? ?MRN: [id number] DOB: May 16, 1966 ? ?Subjective:  ? ?Richard [name] is a 56 y.o. male with PMH of heart disease, COPD, type 2 diabetes, atrial fibrillation presenting, stroke for 3-day history of persistent midsternal chest pain currently rated 5 out of 10.  He is also felt a slow heart rate, palpitations.  He had a significant vision change today as well in the right eye where his central vision was lost.  His symptoms slowly recovered.  Patient is a smoker and he is trying to quit.  He does have a cardiologist and is supposed to be seen by a vascular vein specialist.  He was seen through the hospital and admitted for his chest pain in January of this year. ? ?No current facility-administered medications for this encounter. ? ?Current Outpatient Medications:  ?  albuterol (VENTOLIN HFA) 108 (90 Base) MCG/ACT inhaler, INHALE 2 PUFFS BY MOUTH EVERY 6 HOURS AS NEEDED FOR COUGHING, WHEEZING, OR SHORTNESS OF BREATH, Disp: 13.4 g, Rfl: 1 ?  ALPRAZolam (XANAX) 0.5 MG tablet, Take one twice a day and two at bedtime, Disp: 120 tablet, Rfl: 2 ?  amLODipine (NORVASC) 10 MG tablet, Take 10 mg by mouth daily., Disp: , Rfl:  ?  dronedarone (MULTAQ) 400 MG tablet, Take 1 tablet (400 mg total) by mouth 2 (two) times daily with a meal., Disp: 60 tablet, Rfl: 11 ?  esomeprazole (NEXIUM) 40 MG capsule, Take 1 capsule (40 mg total) by mouth 2 (two) times daily before a meal., Disp: 60 capsule, Rfl: 5 ?  famotidine (PEPCID) 20 MG tablet, Take 1 tablet (20 mg total) by mouth 2 (two) times daily. (Patient taking differently: Take 20 mg by mouth 3 (three) times daily.), Disp: 180 tablet, Rfl: 3 ?  metoprolol succinate (TOPROL XL) 25 MG 24 hr tablet, Take 1 tablet (25 mg total) by mouth daily., Disp: 90 tablet, Rfl: 3 ?  nitroGLYCERIN (NITROSTAT) 0.4 MG SL tablet, Place 1 tablet (0.4 mg total) under the tongue every 5 (five) minutes x 3 doses as needed for chest pain (if no relief after 3rd dose, proceed to  the ED for an evaluation)., Disp: 25 tablet, Rfl: 3 ?  simvastatin (ZOCOR) 20 MG tablet, TAKE 1 TABLET BY MOUTH AT BEDTIME, Disp: 30 tablet, Rfl: 5 ?  XARELTO 20 MG TABS tablet, TAKE 1 Tablet BY MOUTH ONCE EVERY DAY WITH SUPPER, Disp: 90 tablet, Rfl: 1  ? ?Allergies  ?Allergen Reactions  ? Dexilant [Dexlansoprazole] Anaphylaxis  ? Mushroom Ext Cmplx(Shiitake-Reishi-Mait) Anaphylaxis  ?  Rapid heart rate.  ? Penicillins Anaphylaxis  ?  Has patient had a PCN reaction causing immediate rash, facial/tongue/throat swelling, SOB or lightheadedness with hypotension: Yes ?Has patient had a PCN reaction causing severe rash involving mucus membranes or skin necrosis: No ?Has patient had a PCN reaction that required hospitalization Yes ?Has patient had a PCN reaction occurring within the last 10 years: No ?If all of the above answers are "NO", then may proceed with Cephalosporin use. ?  ? Doxycycline Nausea And Vomiting  ?   ?  ? ? ?Past Medical History:  ?Diagnosis Date  ? Arthritis   ? Asthma   ? CAD (coronary artery disease)   ? a. Cardiac cath 07/2015 showed 65% distal Cx, 20% mid-distal LAD, 20% prox-distal RCA, EF 60%, EDP 30mHg.  ? Colitis 1990  ? COPD (chronic obstructive pulmonary disease) ([facility]   ? Depression   ? Essential hypertension   ? Gastric ulcer 2003; 2012  ?  2003: + esophagitis; negative H.pylori serology  2012: Dr. Oneida Alar, mild gastritis, Bravo PH probe placement, negative H.pylori  ? GERD (gastroesophageal reflux disease)   ? Hepatic steatosis   ? History of hiatal hernia   ? Hyperlipemia   ? Overweight   ? Panic attacks   ? Paroxysmal atrial fibrillation (HCC)   ? Stroke [facility])   ? TIA (transient ischemic attack)   ? Type II diabetes mellitus ([place])   ?  ? ?Past Surgical History:  ?Procedure Laterality Date  ? BALLOON DILATION N/A 07/25/2021  ? Procedure: BALLOON DILATION;  Surgeon: Eloise Harman, DO;  [facility];  Service: Endoscopy;  Laterality: N/A;  ? BIOPSY  07/25/2021  ? Procedure:  BIOPSY;  Surgeon: Eloise Harman, DO;  [facility];  Service: Endoscopy;;  ? BRAVO [place] STUDY  05/03/2011  ? RDE:YCXK gastritis/normal esophagus and duodenum  ? CARDIAC CATHETERIZATION  1990s X 1; 2005; 08/12/2015  ? CARDIAC CATHETERIZATION N/A 08/12/2015  ? Procedure: Left Heart Cath and Coronary Angiography;  Surgeon: Belva Crome, MD; LAD 20%, CFX 65%, RCA 20%, EF 60%   ? COLONOSCOPY  1990  ? COLONOSCOPY WITH PROPOFOL N/A 11/21/2016  ? Dr. Oneida Alar: non-thrombosed external hemorrhoids, one 6 mm polyp (polypoid lesion), internal hemorrhoids. TI Normal. 10 years screening  ? ESOPHAGOGASTRODUODENOSCOPY  05/03/2011  ? GYJ:EHUD gastritis  ? ESOPHAGOGASTRODUODENOSCOPY (EGD) WITH PROPOFOL N/A 07/25/2021  ? Procedure: ESOPHAGOGASTRODUODENOSCOPY (EGD) WITH PROPOFOL;  Surgeon: Eloise Harman, DO;  [facility];  Service: Endoscopy;  Laterality: N/A;  1:30pm  ? NECK MASS EXCISION Right   ? "done in dr's office; behind right ear/side of ncek"  ? POLYPECTOMY  11/21/2016  ? Procedure: POLYPECTOMY;  Surgeon: [name], MD;  Location: [facility];  Service: Endoscopy;;  descending colon polyp  ? SHOULDER ARTHROSCOPY W/ ROTATOR CUFF REPAIR Right 2006  ? acromioclavicular joint arthrosis  ? ? ?Family History  ?Problem Relation Age of Onset  ? Lung cancer Mother   ? Alcohol abuse Mother   ? Heart attack Father 92  ? Diabetes Father   ? Alcohol abuse Father   ? Hypertension Brother   ? Hypertension Brother   ? Anxiety disorder Sister   ? Depression Sister   ? Anxiety disorder Sister   ? Heart attack Brother 64  ? Diabetes Brother   ? Hypertension Brother   ? Seizures Brother   ? Dementia Paternal Uncle   ? Dementia Cousin   ? ADD / ADHD Daughter   ? Colon cancer Neg Hx   ? Drug abuse Neg Hx   ? Bipolar disorder Neg Hx   ? OCD Neg Hx   ? Paranoid behavior Neg Hx   ? Schizophrenia Neg Hx   ? Sexual abuse Neg Hx   ? Physical abuse Neg Hx   ? ? ?Social History  ? ?Tobacco Use  ? Smoking status: Every Day  ?   Packs/day: 0.25  ?  Years: 25.00  ?  Pack years: 6.25  ?  Types: Cigarettes  ?  Start date: 07/17/1982  ? Smokeless tobacco: Never  ? Tobacco comments:  ?  4-6 cigarettes daily 04/27/2021  ?Vaping Use  ? Vaping Use: Never used  ?Substance Use Topics  ? Alcohol use: No  ?  Alcohol/week: 0.0 standard drinks  ? Drug use: No  ? ? ?ROS ? ? ?Objective:  ? ?Vitals: ?BP (!) 145/78 (BP Location: Right Arm)   Pulse 69  Temp 98.2 ?F (36.8 ?C) (Oral)   SpO2 96%  ? ?Physical Exam ?Constitutional:   ?   General: He is not in acute distress. ?   Appearance: Normal appearance. He is well-developed. He is not ill-appearing, toxic-appearing or diaphoretic.  ?HENT:  ?   Head: Normocephalic and atraumatic.  ?   Right Ear: External ear normal.  ?   Left Ear: External ear normal.  ?   Nose: Nose normal.  ?   Mouth/Throat:  ?   Mouth: Mucous membranes are moist.  ?Eyes:  ?   General: No scleral icterus.    ?   Right eye: No discharge.     ?   Left eye: No discharge.  ?   Extraocular Movements: Extraocular movements intact.  ?Cardiovascular:  ?   Rate and Rhythm: Normal rate and regular rhythm.  ?   Heart sounds: Normal heart sounds. No murmur heard. ?  No friction rub. No gallop.  ?Pulmonary:  ?   Effort: Pulmonary effort is normal. No respiratory distress.  ?   Breath sounds: Normal breath sounds. No stridor. No wheezing, rhonchi or rales.  ?Neurological:  ?   Mental Status: He is alert and oriented to person, place, and time.  ?   Cranial Nerves: No cranial nerve deficit.  ?   Motor: No weakness.  ?   Coordination: Coordination normal.  ?   Gait: Gait normal.  ?   Comments: Negative Romberg and pronator drift.   ?Psychiatric:     ?   Mood and Affect: Mood normal.     ?   Behavior: Behavior normal.     ?   Thought Content: Thought content normal.  ? ?ED ECG REPORT ? ? Date: 09/27/2021 ? EKG Time: 5:36 PM ? Rate: 60bpm ? Rhythm: normal sinus rhythm,  normal sinus rhythm ? Axis: Normal ? Intervals:none ? ST&T Change: T wave flattening in  lead I, aVL ? Narrative Interpretation: Sinus rhythm at 60 bpm with nonspecific T wave changes.  Previous EKGs are very comparable.  No acute findings. ? ? ?Assessment and Plan :  ? ?PDMP not reviewed this enco

## 2021-09-27 NOTE — Telephone Encounter (Signed)
Pt in office and reports that when he wakes in the morning his HR is in the low 40's and low oxygen level in low 90's. Encouraged pt to monitor BP and Hr and report in a few days.  ?

## 2021-09-27 NOTE — ED Triage Notes (Addendum)
Pt reports her heart rate is around 40's and having palpitations when he wake sup in the morning for the past 2-3 days; metal taste and swollen lymph nodes under jaw.  ? ?Pt reports this morning he saw a grey oblong figure when he tried to see with the right eye. States hi is not having this problem at this moment.   ?

## 2021-09-28 ENCOUNTER — Encounter: Payer: Self-pay | Admitting: Vascular Surgery

## 2021-09-28 ENCOUNTER — Ambulatory Visit (HOSPITAL_COMMUNITY)
Admission: RE | Admit: 2021-09-28 | Discharge: 2021-09-28 | Disposition: A | Discharge status: Home / Self Care | Payer: Self-pay | Source: Ambulatory Visit | Attending: Physician Assistant | Admitting: Physician Assistant

## 2021-09-28 ENCOUNTER — Ambulatory Visit: Payer: Self-pay

## 2021-09-28 ENCOUNTER — Ambulatory Visit: Payer: Self-pay | Admitting: Vascular Surgery

## 2021-09-28 ENCOUNTER — Ambulatory Visit (INDEPENDENT_AMBULATORY_CARE_PROVIDER_SITE_OTHER): Payer: Self-pay | Admitting: Vascular Surgery

## 2021-09-28 ENCOUNTER — Other Ambulatory Visit (HOSPITAL_COMMUNITY): Payer: Self-pay | Admitting: Vascular Surgery

## 2021-09-28 VITALS — BP 113/74 | HR 54 | Ht 71.0 in | Wt 175.4 lb

## 2021-09-28 DIAGNOSIS — I7102 Dissection of abdominal aorta: Secondary | ICD-10-CM

## 2021-09-28 DIAGNOSIS — F17200 Nicotine dependence, unspecified, uncomplicated: Secondary | ICD-10-CM | POA: Insufficient documentation

## 2021-09-28 DIAGNOSIS — I779 Disorder of arteries and arterioles, unspecified: Secondary | ICD-10-CM

## 2021-09-28 DIAGNOSIS — R634 Abnormal weight loss: Secondary | ICD-10-CM | POA: Insufficient documentation

## 2021-09-28 NOTE — Progress Notes (Signed)
[facility] [place]  Patient name: Richard [name] MRN: [id number] DOB: 03-Dec-1965 Sex: male  REASON FOR CONSULT: Discuss recent CT scan suggesting ectasia and possible dissection right common iliac artery  HPI: Richard [name] is a 56 y.o. male, who is here today for discussion of CT scan from 08/12/2021.  He is here today with his wife. x He had a CT scan for evaluation of abdominal microscopic.  This showed no evidence of mesenteric ischemic disease.  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  He is seen today for discussion of this.  He has no symptoms of lower extremity arterial insufficiency.  Specifically he has no evidence of claudication.  Past Medical History:  Diagnosis Date   Arthritis    Asthma    CAD (coronary artery disease)    a. Cardiac cath 07/2015 showed 65% distal Cx, 20% mid-distal LAD, 20% prox-distal RCA, EF 60%, EDP .   Colitis 1990   COPD (chronic obstructive pulmonary disease) (HCC)    Depression    Essential hypertension    Gastric ulcer 2003; 2012   2003: + esophagitis; negative H.pylori serology  2012: Dr. Darrick Penna, mild gastritis, Bravo PH probe placement, negative H.pylori   GERD (gastroesophageal reflux disease)    Hepatic steatosis    History of hiatal hernia    Hyperlipemia    Overweight    Panic attacks    Paroxysmal atrial fibrillation (HCC)    Stroke (HCC)    TIA (transient ischemic attack)    Type II diabetes mellitus (HCC)     Family History  Problem Relation Age of Onset   Lung cancer Mother    Alcohol abuse Mother    Heart attack Father 51   Diabetes Father    Alcohol abuse Father    Hypertension Brother    Hypertension Brother    Anxiety disorder Sister    Depression Sister    Anxiety disorder Sister    Heart attack Brother 78   Diabetes Brother    Hypertension  Brother    Seizures Brother    Dementia Paternal Uncle    Dementia Cousin    ADD / ADHD Daughter    Colon cancer Neg Hx    Drug abuse Neg Hx    Bipolar disorder Neg Hx    OCD Neg Hx    Paranoid behavior Neg Hx    Schizophrenia Neg Hx    Sexual abuse Neg Hx    Physical abuse Neg Hx     SOCIAL HISTORY: Social History   Socioeconomic History   Marital status: Married    Spouse name: Not on file   Number of children: Not on file   Years of education: Not on file   Highest education level: Not on file  Occupational History   Occupation: full time    Employer: UNEMPLOYED  Tobacco Use   Smoking status: Every Day    Packs/day: 0.25    Years: 25.00    Pack years: 6.25    Types: Cigarettes    Start date: 07/17/1982   Smokeless tobacco: Never   Tobacco comments:    4-6 cigarettes daily 04/27/2021  Vaping Use   Vaping Use: Never used  Substance and Sexual Activity   Alcohol use: No    Alcohol/week: 0.0 standard drinks   Drug use: No   Sexual activity: Yes    Birth  control/protection: None  Other Topics Concern   Not on file  Social History Narrative   Pt lives in [place] with wife.  5 children.  Unemployed due to panic attacks and back pain   Social Determinants of Health   Financial Resource Strain: Not on file  Food Insecurity: Not on file  Transportation Needs: Not on file  Physical Activity: Not on file  Stress: Not on file  Social Connections: Not on file  Intimate Partner Violence: Not on file    Allergies  Allergen Reactions   Dexilant [Dexlansoprazole] Anaphylaxis   Mushroom Ext Cmplx(Shiitake-Reishi-Mait) Anaphylaxis    Rapid heart rate.   Penicillins Anaphylaxis    Has patient had a PCN reaction causing immediate rash, facial/tongue/throat swelling, SOB or lightheadedness with hypotension: Yes Has patient had a PCN reaction causing severe rash involving mucus membranes or skin necrosis: No Has patient had a PCN reaction that required  hospitalization Yes Has patient had a PCN reaction occurring within the last 10 years: No If all of the above answers are "NO", then may proceed with Cephalosporin use.    Doxycycline Nausea And Vomiting         Current Outpatient Medications  Medication Sig Dispense Refill   albuterol (VENTOLIN HFA) 108 (90 Base) MCG/ACT inhaler INHALE 2 PUFFS BY MOUTH EVERY 6 HOURS AS NEEDED FOR COUGHING, WHEEZING, OR SHORTNESS OF BREATH 13.4 g 1   ALPRAZolam (XANAX) 0.5 MG tablet Take one twice a day and two at bedtime 120 tablet 2   amLODipine (NORVASC) 10 MG tablet Take 10 mg by mouth daily.     dronedarone (MULTAQ) 400 MG tablet Take 1 tablet (400 mg total) by mouth 2 (two) times daily with a meal. 60 tablet 11   esomeprazole (NEXIUM) 40 MG capsule Take 1 capsule (40 mg total) by mouth 2 (two) times daily before a meal. 60 capsule 5   famotidine (PEPCID) 20 MG tablet Take 1 tablet (20 mg total) by mouth 2 (two) times daily. (Patient taking differently: Take 20 mg by mouth 3 (three) times daily.) 180 tablet 3   metoprolol succinate (TOPROL XL) 25 MG 24 hr tablet Take 1 tablet (25 mg total) by mouth daily. 90 tablet 3   nitroGLYCERIN (NITROSTAT) 0.4 MG SL tablet Place 1 tablet (0.4 mg total) under the tongue every 5 (five) minutes x 3 doses as needed for chest pain (if no relief after 3rd dose, proceed to the ED for an evaluation). 25 tablet 3   simvastatin (ZOCOR) 20 MG tablet TAKE 1 TABLET BY MOUTH AT BEDTIME 30 tablet 5   XARELTO 20 MG TABS tablet TAKE 1 Tablet BY MOUTH ONCE EVERY DAY WITH SUPPER 90 tablet 1   No current facility-administered medications for this visit.    REVIEW OF SYSTEMS:  [X]  denotes positive finding, [ ]  denotes negative finding Cardiac  Comments:  Chest pain or chest pressure: x   Shortness of breath upon exertion: x   Short of breath when lying flat:    Irregular heart rhythm: x       Vascular    Pain in calf, thigh, or hip brought on by ambulation: x   Pain in feet  at night that wakes you up from your sleep:     Blood clot in your veins:    Leg swelling:         Pulmonary    Oxygen at home:    Productive cough:  x   Wheezing:  x  Neurologic    Sudden weakness in arms or legs:     Sudden numbness in arms or legs:     Sudden onset of difficulty speaking or slurred speech:    Temporary loss of vision in one eye:     Problems with dizziness:         Gastrointestinal    Blood in stool:     Vomited blood:         Genitourinary    Burning when urinating:     Blood in urine:        Psychiatric    Major depression:  x       Hematologic    Bleeding problems:    Problems with blood clotting too easily:        Skin    Rashes or ulcers:        Constitutional    Fever or chills:      PHYSICAL EXAM: Vitals:   09/28/21 1024  BP: 113/74  Pulse: (!) 54  Weight: 175 lb 6.4 oz (79.6 kg)  Height: 5\' 11"  (1.803 m)    GENERAL: The patient is a well-nourished male, in no acute distress. The vital signs are documented above. CARDIOVASCULAR: Carotid arteries without bruits bilaterally.  2+ radial and 2+ dorsalis pedis pulses bilaterally.  Abdomen soft and nontender with no bruits noted PULMONARY: There is good air exchange  MUSCULOSKELETAL: There are no major deformities or cyanosis. NEUROLOGIC: No focal weakness or paresthesias are detected. SKIN: There are no ulcers or rashes noted. PSYCHIATRIC: The patient has a normal affect.  DATA:  I reviewed his CT images with the patient and his wife.  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  MEDICAL ISSUES: I discussed this with the patient.  Explained that I do not feel he has any risk related to this.  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  I do not know this puts it at any risk for progression or occlusion.   I would not recommend follow-up evaluation.  He was reassured with this discussion and will see [name] again as needed   Larina Earthly, MD [facility] [phone] Pager [phone]  Note: Portions of this report may have been transcribed using voice recognition software.  Every effort has been made to ensure accuracy; however, inadvertent computerized transcription errors may still be present.

## 2021-09-29 ENCOUNTER — Telehealth: Payer: Self-pay | Admitting: Internal Medicine

## 2021-09-29 NOTE — Telephone Encounter (Signed)
Try reducing the toprol by 1/2.  ?

## 2021-09-29 NOTE — Telephone Encounter (Signed)
Patient c/o Palpitations:  High priority if patient c/o lightheadedness, shortness of breath, or chest pain ? ?How long have you had palpitations/irregular HR/ Afib? In a-fib for the past 4 days. It comes and goes. Are you having the symptoms now?  ? ?Are you currently experiencing lightheadedness, SOB or CP? Off and on dizziness.  No chest pain, just chest pressure.  ? ?Do you have a history of afib (atrial fibrillation) or irregular heart rhythm? Yes, has history ? ?Have you checked your BP or HR? (document readings if available): 135/72 HR 50's ? ?Are you experiencing any other symptoms? no  ?

## 2021-09-29 NOTE — Telephone Encounter (Signed)
Pt stated that for the last 4 days he has been experiencing a decreased hr. Pt stated this morning when he woke up his hr was 39, per pulse ox. Pt stated  that last night he started having chest pressure on the left side of his chest, no pain. Pt visited Urgent Care on Tuesday and had an EKG which showed NSR (in chart), however UC provider urged pt to be seen in ED, which pt refused. Pt states he is compliant with Metoprolol, Xarelto, and Multaq. Pt did not have a log of current bp/hr. Will fwd to provider for recommendations/review.  ?

## 2021-09-30 MED ORDER — METOPROLOL SUCCINATE ER 25 MG PO TB24: 12.5000 mg | ORAL_TABLET | Freq: Every day | ORAL | 3 refills | Status: DC

## 2021-09-30 NOTE — Telephone Encounter (Signed)
Patient agrees to reduce toprol xl to 12.5 mg qd ?

## 2021-09-30 NOTE — Addendum Note (Signed)
Addended by: Barbarann Ehlers A on: 09/30/2021 08:51 AM ? ? Modules accepted: Orders ? ?

## 2021-10-05 ENCOUNTER — Telehealth: Payer: Self-pay | Admitting: Internal Medicine

## 2021-10-05 NOTE — Telephone Encounter (Signed)
Received refill request from patient assistance for Multaq, refill approval faxed back yesterday 10/04/21. Company will request refill evey 90 days,pt made aware. ? ? ? ?Samples Multaq 400 mg bid w food, #12, lot # [id number], exp 11/2023 ?

## 2021-10-05 NOTE — Telephone Encounter (Signed)
Pt c/o medication issue: ? ?1. Name of Medication: dronedarone (MULTAQ) 400 MG tablet ? ?2. How are you currently taking this medication (dosage and times per day)? As prescribed  ? ?3. Are you having a reaction (difficulty breathing--STAT)? No  ? ?4. What is your medication issue? Pt's spouse is calling wanting to know if they get this prescription refill through the office the same way they previously received it or if it comes to them. She states they get it through the company so it is free and the pt runs out tomorrow.  ?

## 2021-10-12 ENCOUNTER — Telehealth: Payer: Self-pay | Admitting: Internal Medicine

## 2021-10-12 ENCOUNTER — Other Ambulatory Visit: Payer: Self-pay

## 2021-10-12 ENCOUNTER — Ambulatory Visit (INDEPENDENT_AMBULATORY_CARE_PROVIDER_SITE_OTHER): Payer: Self-pay | Admitting: Clinical

## 2021-10-12 ENCOUNTER — Telehealth: Payer: Self-pay | Admitting: *Deleted

## 2021-10-12 DIAGNOSIS — F4001 Agoraphobia with panic disorder: Secondary | ICD-10-CM

## 2021-10-12 DIAGNOSIS — F331 Major depressive disorder, recurrent, moderate: Secondary | ICD-10-CM

## 2021-10-12 DIAGNOSIS — F419 Anxiety disorder, unspecified: Secondary | ICD-10-CM

## 2021-10-12 DIAGNOSIS — F4310 Post-traumatic stress disorder, unspecified: Secondary | ICD-10-CM

## 2021-10-12 MED ORDER — MULTAQ 400 MG PO TABS: 400.0000 mg | ORAL_TABLET | Freq: Two times a day (BID) | ORAL | 0 refills | Status: DC

## 2021-10-12 NOTE — Telephone Encounter (Signed)
Patient calling the office for samples of medication: ? ? ?1.  What medication and dosage are you requesting samples for? Multaq ? ?2.  Are you currently out of this medication? Yes- waiting on his medicine to come in- completely out- need this asap please ? ? ?

## 2021-10-12 NOTE — Telephone Encounter (Signed)
Refill was faxed on 10/04/21  ? ? ? ?Samples given ?

## 2021-10-12 NOTE — Progress Notes (Signed)
Virtual Visit via Telephone Note ?  ?I connected with Richard [name] on 10/12/21 at 3:00 PM EST by telephone and verified that I am speaking with the correct person using two identifiers. ?  ?Location: ?Patient: Home ?Provider: Office ?  ?I discussed the limitations, risks, security and privacy concerns of performing an evaluation and management service by telephone and the availability of in person appointments. I also discussed with the patient that there may be a patient responsible charge related to this service. The patient expressed understanding and agreed to proceed. ?  ?THERAPIST PROGRESS NOTE ?  ?  ?Session Time: 3:00 PM-3:30 PM ?  ?Participation Level: Active ?  ?Behavioral Response: Casual and Alert,Non remarkable ?  ?Type of Therapy: Individual Therapy ?  ?Treatment Goals addressed: Mood and Anxiety ?  ?Interventions: CBT ?  ?Summary: Richard [name] is a 56 y.o. male who presents with panic disorder/depression with anxiety/and PTSD . The OPT therapist worked with the patient for his OPT treatment. The OPT therapist utilized Motivational Interviewing to assist in creating therapeutic repore. The patient in the session was engaged and work in collaboration giving feedback about his triggers and symptoms over the past few weeks The patient spoke about his success in recently going to [place] and back and managing his anxiety while having family members with him. The patient is continuing to work on his truck to allow him to have more reliable transportation..The OPT therapist utilized Cognitive Behavioral Therapy through cognitive restructuring as well as worked with the patient on coping strategies to assist in management of symptoms as well as reviewed sleep, eating habits, and general health.  ?  ?Suicidal/Homicidal: Nowithout intent/plan ?  ?Therapist Response:The OPT therapist worked with the patient for the patients scheduled session. The patient was engaged in his session and gave feedback in  relation to triggers, symptoms, and behavior responses over the past few weeks. The OPT therapist worked with the patient utilizing an in session Cognitive Behavioral Therapy exercise. 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 The OPT therapist worked with the patient in this session continuing to over view exposure therapy to assist with the patients anxiety in social situations. The OPT therapist worked with the patient on coping strategies to assist in management of his mood and anxiety.The OPT therapist will continue treatment work with the patient in his  next scheduled session ?  ?Plan: Return again in 2/3 weeks. ?  ?Diagnosis:      Axis I: PTSD/Panic Disorder/ Depression ? Axis II: No diagnosis ?  ?  ?  ?Collaboration of Care: No additional collaboration of care for this session. ?  ?Patient/Guardian was advised Release of Information must be obtained prior to any record release in order to collaborate their care with an outside provider. Patient/Guardian was advised if they have not already done so to contact the registration department to sign all necessary forms in order for [name] to release information regarding their care.  ?  ?Consent: Patient/Guardian gives verbal consent for treatment and assignment of benefits for services provided during this visit. Patient/Guardian expressed understanding and agreed to proceed.  ?  ?I discussed the assessment and treatment plan with the patient. The patient was provided an opportunity to ask questions and all were answered. The patient agreed with the plan and demonstrated an understanding of the instructions. ?  ?  The patient was advised to call back or seek an in-person evaluation if the symptoms worsen or if the condition fails to improve as anticipated. ?  ?I provided 30 minutes of non-face-to-face time during this encounter. ?   ?[name], LCSW ? ? 10/12/2021 ?

## 2021-10-12 NOTE — Telephone Encounter (Signed)
Returned call to patient regarding Multaq. No answer. Left msg to call back.  ?

## 2021-10-13 ENCOUNTER — Telehealth: Payer: Self-pay | Admitting: *Deleted

## 2021-10-13 NOTE — Telephone Encounter (Signed)
Pt notified that patient assistance Multaq has arrived in office.  ?

## 2021-10-19 ENCOUNTER — Ambulatory Visit: Payer: Self-pay | Admitting: Internal Medicine

## 2021-10-27 ENCOUNTER — Telehealth: Payer: Self-pay | Admitting: *Deleted

## 2021-10-27 NOTE — Telephone Encounter (Signed)
? ?  Pre-operative Risk Assessment  ?  ?Patient Name: Richard [name]  ?DOB: 1966/03/03 ?MRN: [id number]  ? ? ? ?Request for Surgical Clearance   ? ?Procedure:  Dental Extraction - Amount of Teeth to be Pulled:  4 ? ?Date of Surgery:  Clearance TBD                              ?   ?Surgeon:  DR. Erik Obey ?Surgeon's Group or Practice Name:  [facility] ?Phone number:  [phone] ?Fax number:  [phone] ?  ?Type of Clearance Requested:   ?- Medical  ?- Pharmacy:  Hold Rivaroxaban (Xarelto) NOT INDICATED ?  ?Type of Anesthesia:  Local  ?  ?Additional requests/questions:   ? ?Signed, ?Unknown Schleyer Avanell Shackleton   ?10/27/2021, 3:48 PM   ?

## 2021-10-27 NOTE — Telephone Encounter (Signed)
Patient has an upcoming appointment with [name] on 11/03/21. I will forward message to him for clearance to be addressed at upcoming appointment. ? ?Routing message to pharmacist for review of guidelines to hold anticoagulation.  ? ?Emmaline Life, NP-C ? ?  ?10/27/2021, 7:28 PM ?[facility] ?[address] ?Office [phone] Fax [phone] ? ?

## 2021-10-31 NOTE — Telephone Encounter (Signed)
Patient with diagnosis of afib on Xarelto for anticoagulation.   ? ?Procedure:  Dental Extraction - Amount of Teeth to be Pulled:  4 ?Date of procedure: TBD ? ? ?CHA2DS2-VASc Score = 5  ? This indicates a 7.2% annual risk of stroke. ?The patient's score is based upon: ?CHF History: 0 ?HTN History: 1 ?Diabetes History: 1 ?Stroke History: 2 ?Vascular Disease History: 1 ?Age Score: 0 ?Gender Score: 0 ?  ?  ? ?CrCl 106 ml/min ? ?Patient does NOT require pre-op antibiotics for dental procedure. ? ?Per office protocol, patient can hold Xarelto for 1 day prior to procedure.   ? ? ?

## 2021-10-31 NOTE — Telephone Encounter (Addendum)
? ?  Patient Name: Richard [name]  ?DOB: 1965/11/28 ?MRN: [id number] ? ?Primary Cardiologist: Rozann Lesches, MD, EP - [name] ? ?Chart reviewed as part of pre-operative protocol coverage.  ? ?The patient already has an upcoming appointment scheduled 11/03/21 with [name] at which time Christen Bame recommended this clearance be addressed in case there are any new issues addressed that would impact pre-operative recommendations. ? ?- Added "preop" comment to appointment notes so that provider is aware to address at time of OV. Per office protocol, the provider seeing this patient should forward their finalized clearance decision, including blood thinner recommendation, to requesting party below. No specific SBE ppx needs identified. ? ?- Will fax update to requesting surgeon so they are aware and remove from preop box as separate preop APP input not necessary at this time. ? ?Charlie Pitter, PA-C ?10/31/2021, 9:53 AM ? ?

## 2021-11-03 ENCOUNTER — Ambulatory Visit (INDEPENDENT_AMBULATORY_CARE_PROVIDER_SITE_OTHER): Payer: Self-pay | Admitting: Internal Medicine

## 2021-11-03 ENCOUNTER — Encounter: Payer: Self-pay | Admitting: Internal Medicine

## 2021-11-03 VITALS — BP 118/68 | HR 57 | Ht 71.0 in | Wt 171.0 lb

## 2021-11-03 DIAGNOSIS — G729 Myopathy, unspecified: Secondary | ICD-10-CM

## 2021-11-03 MED ORDER — NICOTINE 7 MG/24HR TD PT24: 7.0000 mg | MEDICATED_PATCH | Freq: Every day | TRANSDERMAL | 0 refills | Status: DC

## 2021-11-03 MED ORDER — NICOTINE 21 MG/24HR TD PT24: 21.0000 mg | MEDICATED_PATCH | Freq: Every day | TRANSDERMAL | 0 refills | Status: DC

## 2021-11-03 MED ORDER — NICOTINE 14 MG/24HR TD PT24: 14.0000 mg | MEDICATED_PATCH | Freq: Every day | TRANSDERMAL | 0 refills | Status: DC

## 2021-11-03 NOTE — Progress Notes (Addendum)
? ? ? ? ?HPI ?Richard [name] returns for evaluation of PAF. He is a pleasant middle aged man with a h/o tobacco abuse, HTN, COPD, and sleep apnea (probable but undiagnosed), who has had trouble with PAF for over a year. He was treated with flecainide which did not improve his symptoms but then switched to [facility] and is improved. He has been on toprol as well.  He has had a cath with no obtructive CAD. He is still smoking 5-10 cigs a day. He denies fever or chills. He has trouble with snoring according to his wife. He is pending dental extraction ?Allergies  ?Allergen Reactions  ? Dexilant [Dexlansoprazole] Anaphylaxis  ? Mushroom Ext Cmplx(Shiitake-Reishi-Mait) Anaphylaxis  ?  Rapid heart rate.  ? Penicillins Anaphylaxis  ?  Has patient had a PCN reaction causing immediate rash, facial/tongue/throat swelling, SOB or lightheadedness with hypotension: Yes ?Has patient had a PCN reaction causing severe rash involving mucus membranes or skin necrosis: No ?Has patient had a PCN reaction that required hospitalization Yes ?Has patient had a PCN reaction occurring within the last 10 years: No ?If all of the above answers are "NO", then may proceed with Cephalosporin use. ?  ? Doxycycline Nausea And Vomiting  ?   ?  ? ? ? ?Current Outpatient Medications  ?Medication Sig Dispense Refill  ? albuterol (VENTOLIN HFA) 108 (90 Base) MCG/ACT inhaler INHALE 2 PUFFS BY MOUTH EVERY 6 HOURS AS NEEDED FOR COUGHING, WHEEZING, OR SHORTNESS OF BREATH 13.4 g 1  ? ALPRAZolam (XANAX) 0.5 MG tablet Take one twice a day and two at bedtime 120 tablet 2  ? amLODipine (NORVASC) 10 MG tablet Take 10 mg by mouth daily.    ? dronedarone (MULTAQ) 400 MG tablet Take 1 tablet (400 mg total) by mouth 2 (two) times daily with a meal. 60 tablet 11  ? dronedarone (MULTAQ) 400 MG tablet Take 1 tablet (400 mg total) by mouth 2 (two) times daily with a meal. 12 tablet 0  ? esomeprazole (NEXIUM) 40 MG capsule Take 1 capsule (40 mg total) by mouth 2 (two) times  daily before a meal. 60 capsule 5  ? famotidine (PEPCID) 20 MG tablet Take 1 tablet (20 mg total) by mouth 2 (two) times daily. (Patient taking differently: Take 20 mg by mouth 3 (three) times daily.) 180 tablet 3  ? metoprolol succinate (TOPROL XL) 25 MG 24 hr tablet Take 0.5 tablets (12.5 mg total) by mouth daily. 45 tablet 3  ? nitroGLYCERIN (NITROSTAT) 0.4 MG SL tablet Place 1 tablet (0.4 mg total) under the tongue every 5 (five) minutes x 3 doses as needed for chest pain (if no relief after 3rd dose, proceed to the ED for an evaluation). 25 tablet 3  ? simvastatin (ZOCOR) 20 MG tablet TAKE 1 TABLET BY MOUTH AT BEDTIME 30 tablet 5  ? XARELTO 20 MG TABS tablet TAKE 1 Tablet BY MOUTH ONCE EVERY DAY WITH SUPPER 90 tablet 1  ? ?No current facility-administered medications for this visit.  ? ? ? ?Past Medical History:  ?Diagnosis Date  ? Arthritis   ? Asthma   ? CAD (coronary artery disease)   ? a. Cardiac cath 07/2015 showed 65% distal Cx, 20% mid-distal LAD, 20% prox-distal RCA, EF 60%, EDP 35mHg.  ? Colitis 1990  ? COPD (chronic obstructive pulmonary disease) ([facility]   ? Depression   ? Essential hypertension   ? Gastric ulcer 2003; 2012  ? 2003: + esophagitis; negative H.pylori serology  2012: [name]  mild gastritis, [place]ravo PH probe placement, negative H.pylori  ? GERD (gastroesophageal reflux disease)   ? Hepatic steatosis   ? History of hiatal hernia   ? Hyperlipemia   ? Overweight   ? Panic attacks   ? Paroxysmal atrial fibrillation (HCC)   ? Stroke [facility])   ? TIA (transient ischemic attack)   ? Type II diabetes mellitus ([facility])   ? ? ?ROS: ? ? All systems reviewed and negative except as noted in the HPI. ? ? ?Past Surgical History:  ?Procedure Laterality Date  ? BALLOON DILATION N/A 07/25/2021  ? Procedure: BALLOON DILATION;  Surgeon: Eloise Harman, DO;  [facility];  Service: Endoscopy;  Laterality: N/A;  ? BIOPSY  07/25/2021  ? Procedure: BIOPSY;  Surgeon: Eloise Harman, DO;  Location[facility];  Service: Endoscopy;;  ? BRAVO [place] STUDY  05/03/2011  ? ZJQ:BHAL gastritis/normal esophagus and duodenum  ? CARDIAC CATHETERIZATION  1990s X 1; 2005; 08/12/2015  ? CARDIAC CATHETERIZATION N/A 08/12/2015  ? Procedure: Left Heart Cath and Coronary Angiography;  Surgeon: Belva Crome, MD; LAD 20%, CFX 65%, RCA 20%, EF 60%   ? COLONOSCOPY  1990  ? COLONOSCOPY WITH PROPOFOL N/A 11/21/2016  ? Dr. Oneida Alar: non-thrombosed external hemorrhoids, one 6 mm polyp (polypoid lesion), internal hemorrhoids. TI Normal. 10 years screening  ? ESOPHAGOGASTRODUODENOSCOPY  05/03/2011  ? PFX:TKWI gastritis  ? ESOPHAGOGASTRODUODENOSCOPY (EGD) WITH PROPOFOL N/A 07/25/2021  ? Procedure: ESOPHAGOGASTRODUODENOSCOPY (EGD) WITH PROPOFOL;  Surgeon: Eloise Harman, DO;  [facility];  Service: Endoscopy;  Laterality: N/A;  1:30pm  ? NECK MASS EXCISION Right   ? "done in dr's office; behind right ear/side of ncek"  ? POLYPECTOMY  11/21/2016  ? Procedure: POLYPECTOMY;  Surgeon: [name], MD;  Location[facility];  Service: Endoscopy;;  descending colon polyp  ? SHOULDER ARTHROSCOPY W/ ROTATOR CUFF REPAIR Right 2006  ? acromioclavicular joint arthrosis  ? ? ? ?Family History  ?Problem Relation Age of Onset  ? Lung cancer Mother   ? Alcohol abuse Mother   ? Heart attack Father 55  ? Diabetes Father   ? Alcohol abuse Father   ? Hypertension Brother   ? Hypertension Brother   ? Anxiety disorder Sister   ? Depression Sister   ? Anxiety disorder Sister   ? Heart attack Brother 95  ? Diabetes Brother   ? Hypertension Brother   ? Seizures Brother   ? Dementia Paternal Uncle   ? Dementia Cousin   ? ADD / ADHD Daughter   ? Colon cancer Neg Hx   ? Drug abuse Neg Hx   ? Bipolar disorder Neg Hx   ? OCD Neg Hx   ? Paranoid behavior Neg Hx   ? Schizophrenia Neg Hx   ? Sexual abuse Neg Hx   ? Physical abuse Neg Hx   ? ? ? ?Social History  ? ?Socioeconomic History  ? Marital status: Married  ?  Spouse name: Not on file  ? Number of children:  Not on file  ? Years of education: Not on file  ? Highest education level: Not on file  ?Occupational History  ? Occupation: full time  ?  Employer: UNEMPLOYED  ?Tobacco Use  ? Smoking status: Every Day  ?  Packs/day: 0.25  ?  Years: 25.00  ?  Pack years: 6.25  ?  Types: Cigarettes  ?  Start date: 07/17/1982  ? Smokeless tobacco: Never  ? Tobacco  comments:  ?  4-6 cigarettes daily 04/27/2021  ?Vaping Use  ? Vaping Use: Never used  ?Substance and Sexual Activity  ? Alcohol use: No  ?  Alcohol/week: 0.0 standard drinks  ? Drug use: No  ? Sexual activity: Yes  ?  Birth control/protection: None  ?Other Topics Concern  ? Not on file  ?Social History Narrative  ? Pt lives in [place] with wife.  5 children.  Unemployed due to panic attacks and back pain  ? ?Social Determinants of Health  ? ?Financial Resource Strain: Not on file  ?Food Insecurity: Not on file  ?Transportation Needs: Not on file  ?Physical Activity: Not on file  ?Stress: Not on file  ?Social Connections: Not on file  ?Intimate Partner Violence: Not on file  ? ? ? ?BP 118/68   Pulse (!) 57   Ht '5\' 11"'$  (1.803 m)   Wt 171 lb (77.6 kg)   SpO2 99%   BMI 23.85 kg/m?  ? ?Physical Exam: ? ?Well appearing NAD ?HEENT: Unremarkable ?Neck:  No JVD, no thyromegally ?Lymphatics:  No adenopathy ?Back:  No CVA tenderness ?Lungs:  Clear ?HEART:  Regular rate rhythm, no murmurs, no rubs, no clicks ?Abd:  soft, positive bowel sounds, no organomegally, no rebound, no guarding ?Ext:  2 plus pulses, no edema, no cyanosis, no clubbing ?Skin:  No rashes no nodules ?Neuro:  CN II through XII intact, motor grossly intact ? ?Assess/Plan:  ? ?PAF - He will continue multaq. Appears to be improved ?HTN - his bp is controlled. No change in meds. ?Coags - he has not had bleeding on xarelto. ?4.   Tobacco abuse - he is encouraged to go ahead and stop      smoking. We will give him nicotine patches ?5. Dyslipidemia - he is on a statin and c/o muscle aches. I will check a cpk and  tsh.  ?6. Preop teeth extraction - he may proceed. He may hold xarelto up to 3 days if needed prior to the extraction. Restart when the bleeding risk is acceptable. ?  ?Carleene Overlie Regina Ganci,MD ?

## 2021-11-03 NOTE — Patient Instructions (Signed)
Medication Instructions:  ?Your physician has recommended you make the following change in your medication:  ? ?Nicoderm 21 mg for one month then Nicoderm 14 mg for one month Then Nicoderm 7 mg  ? ?*If you need a refill on your cardiac medications before your next appointment, please call your pharmacy* ? ? ?Lab Work: ?Your physician recommends that you return for lab work in: Today  ? ?If you have labs (blood work) drawn today and your tests are completely normal, you will receive your results only by: ?MyChart Message (if you have MyChart) OR ?A paper copy in the mail ?If you have any lab test that is abnormal or we need to change your treatment, we will call you to review the results. ? ? ?Testing/Procedures: ?NONE  ? ? ?Follow-Up: ?At [facility], you and your health needs are our priority.  As part of our continuing mission to provide you with exceptional heart care, we have created designated Provider Care Teams.  These Care Teams include your primary Cardiologist (physician) and Advanced Practice Providers (APPs -  Physician Assistants and Nurse Practitioners) who all work together to provide you with the care you need, when you need it. ? ?We recommend signing up for the patient portal called "MyChart".  Sign up information is provided on this After Visit Summary.  MyChart is used to connect with patients for Virtual Visits (Telemedicine).  Patients are able to view lab/test results, encounter notes, upcoming appointments, etc.  Non-urgent messages can be sent to your provider as well.   ?To learn more about what you can do with MyChart, go to [email or web address].   ? ?Your next appointment:   ?1 year(s) ? ?The format for your next appointment:   ?In Person ? ?Provider:   ?Cristopher Peru, MD  ? ? ?Other Instructions ?Thank you for choosing [facility]! ? ? ? ?Important Information About Sugar ? ? ? ? ?  ?

## 2021-11-07 ENCOUNTER — Other Ambulatory Visit: Payer: Self-pay | Admitting: Physician Assistant

## 2021-11-07 DIAGNOSIS — E785 Hyperlipidemia, unspecified: Secondary | ICD-10-CM

## 2021-11-07 DIAGNOSIS — I2510 Atherosclerotic heart disease of native coronary artery without angina pectoris: Secondary | ICD-10-CM

## 2021-11-07 DIAGNOSIS — E119 Type 2 diabetes mellitus without complications: Secondary | ICD-10-CM

## 2021-11-07 DIAGNOSIS — I10 Essential (primary) hypertension: Secondary | ICD-10-CM

## 2021-11-09 ENCOUNTER — Other Ambulatory Visit (HOSPITAL_COMMUNITY)
Admission: RE | Admit: 2021-11-09 | Discharge: 2021-11-09 | Disposition: A | Discharge status: Home / Self Care | Payer: Self-pay | Source: Ambulatory Visit | Attending: Physician Assistant | Admitting: Physician Assistant

## 2021-11-09 ENCOUNTER — Other Ambulatory Visit (HOSPITAL_COMMUNITY)
Admission: RE | Admit: 2021-11-09 | Discharge: 2021-11-09 | Disposition: A | Discharge status: Home / Self Care | Payer: Self-pay | Source: Ambulatory Visit | Attending: Internal Medicine | Admitting: Internal Medicine

## 2021-11-09 DIAGNOSIS — I2510 Atherosclerotic heart disease of native coronary artery without angina pectoris: Secondary | ICD-10-CM

## 2021-11-09 DIAGNOSIS — E785 Hyperlipidemia, unspecified: Secondary | ICD-10-CM

## 2021-11-09 DIAGNOSIS — I10 Essential (primary) hypertension: Secondary | ICD-10-CM

## 2021-11-09 DIAGNOSIS — G729 Myopathy, unspecified: Secondary | ICD-10-CM

## 2021-11-09 DIAGNOSIS — E119 Type 2 diabetes mellitus without complications: Secondary | ICD-10-CM

## 2021-11-09 LAB — COMPREHENSIVE METABOLIC PANEL
ALT: 31 U/L (ref 0–44)
AST: 14 U/L — ABNORMAL LOW (ref 15–41)
Albumin: 4.2 g/dL (ref 3.5–5.0)
Alkaline Phosphatase: 50 U/L (ref 38–126)
Anion gap: 7 (ref 5–15)
BUN: 16 mg/dL (ref 6–20)
CO2: 28 mmol/L (ref 22–32)
Calcium: 9.1 mg/dL (ref 8.9–10.3)
Chloride: 107 mmol/L (ref 98–111)
Creatinine, Ser: 0.95 mg/dL (ref 0.61–1.24)
GFR, Estimated: >60 mL/min (ref >60)
Glucose, Bld: 122 mg/dL — ABNORMAL HIGH (ref 70–99)
Potassium: 4 mmol/L (ref 3.5–5.1)
Sodium: 142 mmol/L (ref 135–145)
Total Bilirubin: 0.4 mg/dL (ref 0.3–1.2)
Total Protein: 7 g/dL (ref 6.5–8.1)

## 2021-11-09 LAB — CK: Total CK: 66 U/L (ref 49–397)

## 2021-11-09 LAB — LIPID PANEL
Cholesterol: 111 mg/dL (ref 0–200)
HDL: 39 mg/dL — ABNORMAL LOW (ref >40)
LDL Cholesterol: 60 mg/dL (ref 0–99)
Total CHOL/HDL Ratio: 2.8 RATIO
Triglycerides: 59 mg/dL (ref <150)
VLDL: 12 mg/dL (ref 0–40)

## 2021-11-09 LAB — HEMOGLOBIN A1C
Hgb A1c MFr Bld: 5.6 % (ref 4.8–5.6)
Mean Plasma Glucose: 114.02 mg/dL

## 2021-11-09 LAB — TSH: TSH: 1.876 u[IU]/mL (ref 0.350–4.500)

## 2021-11-11 ENCOUNTER — Telehealth: Payer: Self-pay

## 2021-11-11 MED ORDER — ROSUVASTATIN CALCIUM 5 MG PO TABS: 5.0000 mg | ORAL_TABLET | Freq: Every day | ORAL | 3 refills | Status: DC

## 2021-11-11 NOTE — Telephone Encounter (Signed)
-----   Message from Evans Lance, MD sent at 11/10/2021 10:09 PM EDT ----- ?Richard [name], let him know that his thyroid and muscle enzyme tests are normal. As he has had muscle aches on simvastatin, I would recommend trying crestor 5 mg daily. Usually better tolerated and should provide comparable cholesterol lowering.  ?

## 2021-11-11 NOTE — Telephone Encounter (Signed)
Patient notified and verbalized understanding. Pt agreeable to switching simvastatin to crestor 5 mg tablets daily. PCP copied.  ?

## 2021-11-14 ENCOUNTER — Ambulatory Visit (INDEPENDENT_AMBULATORY_CARE_PROVIDER_SITE_OTHER): Payer: Self-pay | Admitting: Clinical

## 2021-11-14 DIAGNOSIS — F4001 Agoraphobia with panic disorder: Secondary | ICD-10-CM

## 2021-11-14 DIAGNOSIS — F331 Major depressive disorder, recurrent, moderate: Secondary | ICD-10-CM

## 2021-11-14 DIAGNOSIS — F419 Anxiety disorder, unspecified: Secondary | ICD-10-CM

## 2021-11-14 DIAGNOSIS — F4310 Post-traumatic stress disorder, unspecified: Secondary | ICD-10-CM

## 2021-11-14 NOTE — Progress Notes (Signed)
Virtual Visit via Telephone Note ?  ?I connected with Richard [name] on 11/14/21 at 2:00 PM EST by telephone and verified that I am speaking with the correct person using two identifiers. ?  ?Location: ?Patient: Home ?Provider: Office ?  ?I discussed the limitations, risks, security and privacy concerns of performing an evaluation and management service by telephone and the availability of in person appointments. I also discussed with the patient that there may be a patient responsible charge related to this service. The patient expressed understanding and agreed to proceed. ?  ?THERAPIST PROGRESS NOTE ?  ?  ?Session Time: 2:00 PM-2:45 PM ?  ?Participation Level: Active ?  ?Behavioral Response: Casual and Alert,Non remarkable ?  ?Type of Therapy: Individual Therapy ?  ?Treatment Goals addressed: Mood and Anxiety ?  ?Interventions: CBT ?  ?Summary: Richard [name] is a 56 y.o. male who presents with panic disorder/depression with anxiety/and PTSD . The OPT therapist worked with the patient for his OPT treatment. The OPT therapist utilized Motivational Interviewing to assist in creating therapeutic repore. The patient in the session was engaged and work in collaboration giving feedback about his triggers and symptoms over the past few weeks The patient spoke about his hardship recently losing a long time family pet.The OPT therapist worked with the patient and reviewed grieving process and taking time to adjust.The OPT therapist utilized Public relations account executive Therapy through cognitive restructuring as well as worked with the patient on coping strategies to assist in management of symptoms as well as reviewed sleep, eating habits, and general health.  ?  ?Suicidal/Homicidal: Nowithout intent/plan ?  ?Therapist Response:The OPT therapist worked with the patient for the patients scheduled session. The patient was engaged in his session and gave feedback in relation to triggers, symptoms, and behavior responses over the  past few weeks. The OPT therapist worked with the patient utilizing an in session Cognitive Behavioral Therapy exercise. The patient was responsive in the session and verbalized, " We will move on but its hard and this will take time and I dont think I will get anymore pets I cant take it when something happens". The OPT therapist worked with the patient in this session providing support and reviewing with the patient grieving and placing emphasis on the patient working to remain active and utilizing his support symptom. The OPT therapist worked with the patient on coping strategies to assist in management of his mood and anxiety.The OPT therapist will continue treatment work with the patient in his  next scheduled session ?  ?Plan: Return again in 2/3 weeks. ?  ?Diagnosis:      Axis I: PTSD/Panic Disorder/ Depression ? Axis II: No diagnosis ?  ?  ?  ?Collaboration of Care: No additional collaboration of care for this session. ?  ?Patient/Guardian was advised Release of Information must be obtained prior to any record release in order to collaborate their care with an outside provider. Patient/Guardian was advised if they have not already done so to contact the registration department to sign all necessary forms in order for [name] to release information regarding their care.  ?  ?Consent: Patient/Guardian gives verbal consent for treatment and assignment of benefits for services provided during this visit. Patient/Guardian expressed understanding and agreed to proceed.  ?  ?I discussed the assessment and treatment plan with the patient. The patient was provided an opportunity to ask questions and all were answered. The patient agreed with the plan and demonstrated an understanding of the instructions. ?  ?  The patient was advised to call back or seek an in-person evaluation if the symptoms worsen or if the condition fails to improve as anticipated. ?  ?I provided 45 minutes of non-face-to-face time during this  encounter. ?  ?[name], LCSW ? ?11/14/2021 ?

## 2021-11-15 ENCOUNTER — Ambulatory Visit: Payer: Self-pay | Admitting: Physician Assistant

## 2021-11-15 ENCOUNTER — Encounter: Payer: Self-pay | Admitting: Physician Assistant

## 2021-11-15 ENCOUNTER — Ambulatory Visit: Payer: Self-pay | Admitting: Gastroenterology

## 2021-11-15 VITALS — BP 105/60 | HR 60 | Temp 97.1°F | Wt 172.0 lb

## 2021-11-15 DIAGNOSIS — K219 Gastro-esophageal reflux disease without esophagitis: Secondary | ICD-10-CM

## 2021-11-15 DIAGNOSIS — I2510 Atherosclerotic heart disease of native coronary artery without angina pectoris: Secondary | ICD-10-CM

## 2021-11-15 DIAGNOSIS — I10 Essential (primary) hypertension: Secondary | ICD-10-CM

## 2021-11-15 DIAGNOSIS — R918 Other nonspecific abnormal finding of lung field: Secondary | ICD-10-CM

## 2021-11-15 DIAGNOSIS — R9389 Abnormal findings on diagnostic imaging of other specified body structures: Secondary | ICD-10-CM

## 2021-11-15 DIAGNOSIS — E785 Hyperlipidemia, unspecified: Secondary | ICD-10-CM

## 2021-11-15 DIAGNOSIS — E119 Type 2 diabetes mellitus without complications: Secondary | ICD-10-CM

## 2021-11-15 DIAGNOSIS — F17200 Nicotine dependence, unspecified, uncomplicated: Secondary | ICD-10-CM

## 2021-11-15 NOTE — Progress Notes (Signed)
? ?BP 105/60   Pulse 60   Temp (!) 97.1 ?F (36.2 ?C)   Wt 172 lb (78 kg)   SpO2 98%   BMI 23.99 kg/m?   ? ?Subjective:  ? ? Patient ID: Richard [name], male    DOB: 1966-04-18, 56 y.o.   MRN: [id number] ? ?HPI: ?Richard [name] is a 56 y.o. male presenting on 11/15/2021 for Hyperlipidemia and Diabetes ? ? ?HPI ? ? ?Chief Complaint  ?Patient presents with  ? Hyperlipidemia  ? Diabetes  ? ?Chief Complaint  ?Patient presents with  ? Hyperlipidemia  ? Diabetes  ? ? ?Pt was seen by cardiology about 2 weeks ago.  He was changed to crestor by cardiology but he hasn't started the new med yet ? ?He was seen by vascular surgery in March for aorto-iliac disease ? ?He has been off all DM meds since 07/21/2021.  He continues to follow diabetic diet and he is avoiding sodas. ? ?He is followed by GI for gerd and constipation ? ?He is still smoking some but is working on stopping.   ? ? ? ? ?Relevant past medical, surgical, family and social history reviewed and updated as indicated. Interim medical history since our last visit reviewed. ?Allergies and medications reviewed and updated. ? ? ?Current Outpatient Medications:  ?  albuterol (VENTOLIN HFA) 108 (90 Base) MCG/ACT inhaler, INHALE 2 PUFFS BY MOUTH EVERY 6 HOURS AS NEEDED FOR COUGHING, WHEEZING, OR SHORTNESS OF BREATH, Disp: 13.4 g, Rfl: 1 ?  ALPRAZolam (XANAX) 0.5 MG tablet, Take one twice a day and two at bedtime, Disp: 120 tablet, Rfl: 2 ?  amLODipine (NORVASC) 10 MG tablet, Take 10 mg by mouth daily., Disp: , Rfl:  ?  dronedarone (MULTAQ) 400 MG tablet, Take 1 tablet (400 mg total) by mouth 2 (two) times daily with a meal., Disp: 60 tablet, Rfl: 11 ?  esomeprazole (NEXIUM) 40 MG capsule, Take 1 capsule (40 mg total) by mouth 2 (two) times daily before a meal., Disp: 60 capsule, Rfl: 5 ?  famotidine (PEPCID) 20 MG tablet, Take 1 tablet (20 mg total) by mouth 2 (two) times daily. (Patient taking differently: Take 20 mg by mouth 3 (three) times daily.), Disp: 180 tablet, Rfl:  3 ?  metoprolol succinate (TOPROL XL) 25 MG 24 hr tablet, Take 0.5 tablets (12.5 mg total) by mouth daily., Disp: 45 tablet, Rfl: 3 ?  nitroGLYCERIN (NITROSTAT) 0.4 MG SL tablet, Place 1 tablet (0.4 mg total) under the tongue every 5 (five) minutes x 3 doses as needed for chest pain (if no relief after 3rd dose, proceed to the ED for an evaluation)., Disp: 25 tablet, Rfl: 3 ?  simvastatin (ZOCOR) 20 MG tablet, Take 20 mg by mouth daily., Disp: , Rfl:  ?  XARELTO 20 MG TABS tablet, TAKE 1 Tablet BY MOUTH ONCE EVERY DAY WITH SUPPER, Disp: 90 tablet, Rfl: 1 ?  dronedarone (MULTAQ) 400 MG tablet, Take 1 tablet (400 mg total) by mouth 2 (two) times daily with a meal. (Patient not taking: Reported on 11/15/2021), Disp: 12 tablet, Rfl: 0 ?  nicotine (NICODERM CQ) 14 mg/24hr patch, Place 1 patch (14 mg total) onto the skin daily. (Patient not taking: Reported on 11/15/2021), Disp: 28 patch, Rfl: 0 ?  nicotine (NICODERM CQ) 21 mg/24hr patch, Place 1 patch (21 mg total) onto the skin daily. (Patient not taking: Reported on 11/15/2021), Disp: 28 patch, Rfl: 0 ?  nicotine (NICODERM CQ) 7 mg/24hr patch, Place 1 patch (7  mg total) onto the skin daily. (Patient not taking: Reported on 11/15/2021), Disp: 28 patch, Rfl: 0 ?  rosuvastatin (CRESTOR) 5 MG tablet, Take 1 tablet (5 mg total) by mouth daily. (Patient not taking: Reported on 11/15/2021), Disp: 90 tablet, Rfl: 3 ? ? ? ? ?Review of Systems ? ?Per HPI unless specifically indicated above ? ?   ?Objective:  ?  ?BP 105/60   Pulse 60   Temp (!) 97.1 ?F (36.2 ?C)   Wt 172 lb (78 kg)   SpO2 98%   BMI 23.99 kg/m?   ?Wt Readings from Last 3 Encounters:  ?11/15/21 172 lb (78 kg)  ?11/03/21 171 lb (77.6 kg)  ?09/28/21 175 lb 6.4 oz (79.6 kg)  ?  ?Physical Exam ?Vitals reviewed.  ?Constitutional:   ?   General: He is not in acute distress. ?   Appearance: He is well-developed. He is not ill-appearing.  ?HENT:  ?   Head: Normocephalic and atraumatic.  ?Cardiovascular:  ?   Rate and Rhythm:  Normal rate and regular rhythm.  ?Pulmonary:  ?   Effort: Pulmonary effort is normal.  ?   Breath sounds: Normal breath sounds. No wheezing.  ?Abdominal:  ?   General: Bowel sounds are normal.  ?   Palpations: Abdomen is soft.  ?   Tenderness: There is no abdominal tenderness.  ?Musculoskeletal:  ?   Cervical back: Neck supple.  ?   Right lower leg: No edema.  ?   Left lower leg: No edema.  ?Lymphadenopathy:  ?   Cervical: No cervical adenopathy.  ?Skin: ?   General: Skin is warm and dry.  ?Neurological:  ?   Mental Status: He is alert and oriented to person, place, and time.  ?Psychiatric:     ?   Behavior: Behavior normal.  ? ? ?Results for orders placed or performed during the hospital encounter of 11/09/21  ?Hemoglobin A1c  ?Result Value Ref Range  ? Hgb A1c MFr Bld 5.6 4.8 - 5.6 %  ? Mean Plasma Glucose 114.02 mg/dL  ?Comprehensive metabolic panel  ?Result Value Ref Range  ? Sodium 142 135 - 145 mmol/L  ? Potassium 4.0 3.5 - 5.1 mmol/L  ? Chloride 107 98 - 111 mmol/L  ? CO2 28 22 - 32 mmol/L  ? Glucose, Bld 122 (H) 70 - 99 mg/dL  ? BUN 16 6 - 20 mg/dL  ? Creatinine, Ser 0.95 0.61 - 1.24 mg/dL  ? Calcium 9.1 8.9 - 10.3 mg/dL  ? Total Protein 7.0 6.5 - 8.1 g/dL  ? Albumin 4.2 3.5 - 5.0 g/dL  ? AST 14 (L) 15 - 41 U/L  ? ALT 31 0 - 44 U/L  ? Alkaline Phosphatase 50 38 - 126 U/L  ? Total Bilirubin 0.4 0.3 - 1.2 mg/dL  ? GFR, Estimated >60 >60 mL/min  ? Anion gap 7 5 - 15  ?Lipid panel  ?Result Value Ref Range  ? Cholesterol 111 0 - 200 mg/dL  ? Triglycerides 59 <150 mg/dL  ? HDL 39 (L) >40 mg/dL  ? Total CHOL/HDL Ratio 2.8 RATIO  ? VLDL 12 0 - 40 mg/dL  ? LDL Cholesterol 60 0 - 99 mg/dL  ?TSH  ?Result Value Ref Range  ? TSH 1.876 0.350 - 4.500 uIU/mL  ?CK  ?Result Value Ref Range  ? Total CK 66 49 - 397 U/L  ? ?   ?Assessment & Plan:  ? ? ? ?Encounter Diagnoses  ?Name Primary?  ? Diet-controlled diabetes  mellitus ([place]) Yes  ? Hyperlipidemia, unspecified hyperlipidemia type   ? Coronary artery disease involving  native heart without angina pectoris, unspecified vessel or lesion type   ? Essential hypertension   ? Tobacco use disorder   ? Gastroesophageal reflux disease, unspecified whether esophagitis present   ? Abnormal chest CT   ? Pulmonary nodules   ? ? ? ? ? ? ?-reviewed labs with pt ? ?-No changes today/pt to continue current medications ? ?-reviewed results CT scan done in March.  Will repeat 12 months ? ?-encouraged smoking cessation ? ?-pt educated and encouraged to get covid vaccination ? ?-refer for DM eye exam-  ( [facility] office only) ? ?-pt to follow up 3 months.  He is to contact office sooner prn ? ? ? ?

## 2021-11-15 NOTE — Patient Instructions (Signed)
COVID-19 vaccination significantly lowers your risk of severe illness, hospitalization, and death if you get infected. Compared to people who are up to date with their COVID-19 vaccinations, unvaccinated people aremore likely to get COVID-19, much more likely to be hospitalized with COVID-19, and much more likely to die from COVID-19. ?Like all vaccines, COVID-19 vaccines are not 100% effective at preventing infection. Some people who are up to date with their COVID-19 vaccinations will get COVID-19 breakthrough infection. However, staying up to date with your COVID-19 vaccinations means that you are less likely to have a breakthrough infection and, if you do get sick, you are less likely to get severely ill or die. Staying up to date with COVID-19 vaccination also means you are less likely to spread the disease to others and increases your protection against new variants of SARS-CoV-2, the virus that causes COVID-19. ? ?

## 2021-11-16 DIAGNOSIS — R918 Other nonspecific abnormal finding of lung field: Secondary | ICD-10-CM | POA: Insufficient documentation

## 2021-11-16 DIAGNOSIS — R9389 Abnormal findings on diagnostic imaging of other specified body structures: Secondary | ICD-10-CM | POA: Insufficient documentation

## 2021-11-17 ENCOUNTER — Ambulatory Visit (INDEPENDENT_AMBULATORY_CARE_PROVIDER_SITE_OTHER): Payer: Self-pay | Admitting: Internal Medicine

## 2021-11-17 ENCOUNTER — Encounter: Payer: Self-pay | Admitting: Internal Medicine

## 2021-11-17 VITALS — BP 116/60 | HR 62 | Temp 97.3°F | Ht 71.0 in | Wt 172.6 lb

## 2021-11-17 DIAGNOSIS — K5900 Constipation, unspecified: Secondary | ICD-10-CM

## 2021-11-17 DIAGNOSIS — K219 Gastro-esophageal reflux disease without esophagitis: Secondary | ICD-10-CM

## 2021-11-17 MED ORDER — ESOMEPRAZOLE MAGNESIUM 40 MG PO CPDR: 40.0000 mg | DELAYED_RELEASE_CAPSULE | Freq: Two times a day (BID) | ORAL | 11 refills | Status: DC

## 2021-11-17 NOTE — Progress Notes (Signed)
? ? ?Referring Provider: [name], PA-C ?Primary Care Physician:  [name], PA-C ?Primary GI:  Dr. Abbey Chatters ? ?Chief Complaint  ?Patient presents with  ? Follow-up  ? ? ?HPI:   ?Richard [name] is a 56 y.o. male who presents to clinic today for follow-up visit. He has history of chronic GERD, abdominal pain, dysphagia, constipation, weight loss. 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  ?  ?EGD 07/2021: small hiatal hernia, abnormal esophageal motility, iron pill gastritis, no h.pylori, mucosal variant in duodenum (bx with focal lymphangiectasia, neg for celiac). He was also hospitalized January 2023 with suspicion of stroke but CT and MRI unremarkable. Patient advised to stop Xanax. Patient states he thinks his symptoms were due to anxiety/panic attack. Neuro consulted, ?TIAs or complicated migraine. ? ?CT angiogram of the abdomen pelvis 08/15/2021 largely unremarkable from a GI standpoint.  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  Has been referred to vascular. ? ?HIDA scan to 08/25/2021 showed gallbladder ejection fraction of 33%, borderline. ? ?Was having regular reflux symptoms on Pepcid on previous visit.  Switched him back to Nexium. Does have an allergy to [place] in the past with throat swelling.  States he tolerated Nexium for many years. Heartburn is better on Nexium. Some intermittent epigastric discomfort.  ? ?Previously on MiraLAX for constipation.  States his bowels are moving better since modifying his diet.  Not taking MiraLAX every day anymore. ? ?Last colonoscopy 2018, 1 benign polyp removed, recommended 10-year recall. ? ?Past Medical History:  ?Diagnosis Date  ? Arthritis   ? Asthma   ? CAD (coronary artery disease)   ? a. Cardiac cath 07/2015 showed 65% distal Cx, 20% mid-distal LAD, 20% prox-distal RCA, EF 60%, EDP 80mHg.  ? Colitis 1990  ? COPD (chronic obstructive pulmonary  disease) ([place]   ? Depression   ? Essential hypertension   ? Gastric ulcer 2003; 2012  ? 2003: + esophagitis; negative H.pylori serology  2012: [name] mild gastritis, Bravo PH probe placement, negative H.pylori  ? GERD (gastroesophageal reflux disease)   ? Hepatic steatosis   ? History of hiatal hernia   ? Hyperlipemia   ? Overweight   ? Panic attacks   ? Paroxysmal atrial fibrillation (HCC)   ? Stroke ([facility]   ? TIA (transient ischemic attack)   ? [place] II diabetes mellitus [place]   ? ? ?Past Surgical History:  ?Procedure Laterality Date  ? BALLOON DILATION N/A 07/25/2021  ? Procedure: BALLOON DILATION;  Surgeon: CEloise Harman DO;  [facility];  Service: Endoscopy;  Laterality: N/A;  ? BIOPSY  07/25/2021  ? Procedure: BIOPSY;  Surgeon: CEloise Harman DO;  [facility];  Service: Endoscopy;;  ? [place]  05/03[place]  ? SVOJ:JKKXgastritis/normal esophagus and duodenum  ? CARDIAC CATHETERIZATION  1990s X 1; 2005; 08/12/2015  ? CARDIAC CATHETERIZATION N/A 08/12/2015  ? Procedure: Left Heart Cath and Coronary Angiography;  Surgeon: HBelva Crome MD; LAD 20%, CFX 65%, RCA 20%, EF 60%   ? COLONOSCOPY  1990  ? COLONOSCOPY WITH PROPOFOL N/A 11/21/2016  ? [name] non-thrombosed external hemorrhoids, one 6 mm polyp (polypoid lesion), internal hemorrhoids. TI Normal. 10 years screening  ? ESOPHAGOGASTRODUODENOSCOPY  05/03/2011  ? [facility]:WEXHgastritis  ? ESOPHAGOGASTRODUODENOSCOPY (EGD) WITH PROPOFOL N/A 07/25/2021  ? Procedure: ESOPHAGOGASTRODUODENOSCOPY (EGD) WITH PROPOFOL;  Surgeon: CEloise Harman DO;  [facility];  Service: Endoscopy;  Laterality: N/A;  1:30pm  ? NECK MASS EXCISION Right   ? "done in dr's office; behind right ear/side of ncek"  ? POLYPECTOMY  11/21/2016  ? Procedure: POLYPECTOMY;  Surgeon: [name], MD;  [facility];  Service: Endoscopy;;  descending colon polyp  ? SHOULDER ARTHROSCOPY W/ ROTATOR CUFF REPAIR Right 2006  ? acromioclavicular  joint arthrosis  ? ? ?Current Outpatient Medications  ?Medication Sig Dispense Refill  ? albuterol (VENTOLIN HFA) 108 (90 Base) MCG/ACT inhaler INHALE 2 PUFFS BY MOUTH EVERY 6 HOURS AS NEEDED FOR COUGHING, WHEEZING, OR SHORTNESS OF BREATH 13.4 g 1  ? ALPRAZolam (XANAX) 0.5 MG tablet Take one twice a day and two at bedtime 120 tablet 2  ? amLODipine (NORVASC) 10 MG tablet Take 10 mg by mouth daily.    ? dronedarone (MULTAQ) 400 MG tablet Take 1 tablet (400 mg total) by mouth 2 (two) times daily with a meal. 60 tablet 11  ? esomeprazole (NEXIUM) 40 MG capsule Take 1 capsule (40 mg total) by mouth 2 (two) times daily before a meal. 60 capsule 5  ? famotidine (PEPCID) 20 MG tablet Take 1 tablet (20 mg total) by mouth 2 (two) times daily. (Patient taking differently: Take 20 mg by mouth 3 (three) times daily.) 180 tablet 3  ? metoprolol succinate (TOPROL XL) 25 MG 24 hr tablet Take 0.5 tablets (12.5 mg total) by mouth daily. 45 tablet 3  ? nitroGLYCERIN (NITROSTAT) 0.4 MG SL tablet Place 1 tablet (0.4 mg total) under the tongue every 5 (five) minutes x 3 doses as needed for chest pain (if no relief after 3rd dose, proceed to the ED for an evaluation). 25 tablet 3  ? rosuvastatin (CRESTOR) 5 MG tablet Take 1 tablet (5 mg total) by mouth daily. 90 tablet 3  ? simvastatin (ZOCOR) 20 MG tablet Take 20 mg by mouth daily.    ? XARELTO 20 MG TABS tablet TAKE 1 Tablet BY MOUTH ONCE EVERY DAY WITH SUPPER 90 tablet 1  ? dronedarone (MULTAQ) 400 MG tablet Take 1 tablet (400 mg total) by mouth 2 (two) times daily with a meal. (Patient not taking: Reported on 11/15/2021) 12 tablet 0  ? nicotine (NICODERM CQ) 14 mg/24hr patch Place 1 patch (14 mg total) onto the skin daily. (Patient not taking: Reported on 11/15/2021) 28 patch 0  ? nicotine (NICODERM CQ) 21 mg/24hr patch Place 1 patch (21 mg total) onto the skin daily. (Patient not taking: Reported on 11/15/2021) 28 patch 0  ? nicotine (NICODERM CQ) 7 mg/24hr patch Place 1 patch (7 mg  total) onto the skin daily. (Patient not taking: Reported on 11/15/2021) 28 patch 0  ? ?No current facility-administered medications for this visit.  ? ? ?Allergies as of 11/17/2021 - Review Complete 11/17/2021  ?Allergen Reaction Noted  ? Dexilant [dexlansoprazole] Anaphylaxis 01/20/2015  ? Mushroom ext cmplx(shiitake-reishi-mait) Anaphylaxis 03/22/2011  ? Penicillins Anaphylaxis   ? Doxycycline Nausea And Vomiting 04/18/2016  ? ? ?Family History  ?Problem Relation Age of Onset  ? Lung cancer Mother   ? Alcohol abuse Mother   ? Heart attack Father 23  ? Diabetes Father   ? Alcohol abuse Father   ? Hypertension Brother   ? Hypertension Brother   ? Anxiety disorder Sister   ? Depression Sister   ? Anxiety disorder Sister   ? Heart attack Brother 43  ? Diabetes Brother   ? Hypertension Brother   ? Seizures Brother   ? Dementia Paternal  Uncle   ? Dementia Cousin   ? ADD / ADHD Daughter   ? Colon cancer Neg Hx   ? Drug abuse Neg Hx   ? Bipolar disorder Neg Hx   ? OCD Neg Hx   ? Paranoid behavior Neg Hx   ? Schizophrenia Neg Hx   ? Sexual abuse Neg Hx   ? Physical abuse Neg Hx   ? ? ?Social History  ? ?Socioeconomic History  ? Marital status: Married  ?  Spouse name: Not on file  ? Number of children: Not on file  ? Years of education: Not on file  ? Highest education level: Not on file  ?Occupational History  ? Occupation: full time  ?  Employer: UNEMPLOYED  ?Tobacco Use  ? Smoking status: Every Day  ?  Packs/day: 0.25  ?  Years: 25.00  ?  Pack years: 6.25  ?  Types: Cigarettes  ?  Start date: 07/17/1982  ? Smokeless tobacco: Never  ? Tobacco comments:  ?  4-6 cigarettes daily 04/27/2021  ?Vaping Use  ? Vaping Use: Never used  ?Substance and Sexual Activity  ? Alcohol use: No  ?  Alcohol/week: 0.0 standard drinks  ? Drug use: No  ? Sexual activity: Yes  ?  Birth control/protection: None  ?Other Topics Concern  ? Not on file  ?Social History Narrative  ? Pt lives in [place] with wife.  5 children.  Unemployed due to  panic attacks and back pain  ? ?Social Determinants of Health  ? ?Financial Resource Strain: Not on file  ?Food Insecurity: Not on file  ?Transportation Needs: Not on file  ?Physical Activity: Not on file  ?Stress:

## 2021-11-17 NOTE — Patient Instructions (Signed)
Continue on Nexium 40 mg twice daily.  I sent a prescription to [place]. ? ?Continue Pepcid 1-2 times daily as needed for breakthrough symptoms. ? ?Happy to hear that your bowels are moving better.  Can take MiraLAX as needed if you feel as though you are getting backed up again. ? ?Follow-up with GI in 6 months.  It was nice seeing both you today. ? ?Dr. Abbey Chatters ? ?At [facility] we value your feedback. You may receive a survey about your visit today. Please share your experience as we strive to create trusting relationships with our patients to provide genuine, compassionate, quality care. ? ?We appreciate your understanding and patience as we review any laboratory studies, imaging, and other diagnostic tests that are ordered as we care for you. Our office policy is 5 business days for review of these results, and any emergent or urgent results are addressed in a timely manner for your best interest. If you do not hear from our office in 1 week, please contact us.  ? ?We also encourage the use of MyChart, which contains your medical information for your review as well. If you are not enrolled in this feature, an access code is on this after visit summary for your convenience. Thank you for allowing [name] to be involved in your care. ? ?It was great to see you today!  I hope you have a great rest of your Spring! ? ? ? ?Elon Alas. Abbey Chatters, D.O. ?Gastroenterology and Hepatology ?[facility] ? ?

## 2021-11-29 ENCOUNTER — Encounter (HOSPITAL_COMMUNITY): Payer: Self-pay | Admitting: Psychiatry

## 2021-11-29 ENCOUNTER — Telehealth (INDEPENDENT_AMBULATORY_CARE_PROVIDER_SITE_OTHER): Payer: Self-pay | Admitting: Psychiatry

## 2021-11-29 DIAGNOSIS — F4001 Agoraphobia with panic disorder: Secondary | ICD-10-CM

## 2021-11-29 MED ORDER — ALPRAZOLAM 0.5 MG PO TABS
ORAL_TABLET | ORAL | 2 refills | Status: DC
Start: 2021-11-29 — End: 2022-02-21

## 2021-11-29 NOTE — Progress Notes (Signed)
Virtual Visit via Telephone Note ? ?I connected with Richard [name] on 11/29/21 at 10:40 AM EDT by telephone and verified that I am speaking with the correct person using two identifiers. ? ?Location: ?Patient: home ?Provider: office ?  ?I discussed the limitations, risks, security and privacy concerns of performing an evaluation and management service by telephone and the availability of in person appointments. I also discussed with the patient that there may be a patient responsible charge related to this service. The patient expressed understanding and agreed to proceed. ? ? ? ?  ?I discussed the assessment and treatment plan with the patient. The patient was provided an opportunity to ask questions and all were answered. The patient agreed with the plan and demonstrated an understanding of the instructions. ?  ?The patient was advised to call back or seek an in-person evaluation if the symptoms worsen or if the condition fails to improve as anticipated. ? ?I provided 15 minutes of non-face-to-face time during this encounter. ? ? ?[name], MD ? ?BH MD/PA/NP OP Progress Note ? ?11/29/2021 10:55 AM ?Richard [name]  ?MRN:  [id number] ? ?Chief Complaint:  ?Chief Complaint  ?Patient presents with  ? Anxiety  ? Follow-up  ? ?HPI: This patient is a 56 year old Caucasian male who lives with his wife in [place].  He is unemployed. ? ?The patient returns for follow-up after 3 months.  He still is struggling with various health issues primarily his atrial fibrillation weakness and fatigue.  He is sleeping fairly well.  He still having a lot of panic attacks and anxiety but he states the Xanax helps.  It sounds as if he was taking it as needed and I explained that it would work better if he takes it on a scheduled basis to prevent panic attacks.  He is going to try to do this.  He is also benefiting a lot from his therapy with Maye Hides. ? ?The patient states that he lost his dog of 10 years about a month ago and  this has been very difficult for him but he is slowly getting through it.  I again suggested something to help with the continued anxiety such as mirtazapine but he declined any new medications. ?Visit Diagnosis:  ?  ICD-10-CM   ?1. 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  ? ? ?Past Psychiatric History: none ? ?Past Medical History:  ?Past Medical History:  ?Diagnosis Date  ? Arthritis   ? Asthma   ? CAD (coronary artery disease)   ? a. Cardiac cath 07/2015 showed 65% distal Cx, 20% mid-distal LAD, 20% prox-distal RCA, EF 60%, EDP 88mHg.  ? Colitis 1990  ? [place] obstructive [place] disease) [place]   ? Depression   ? Essential hypertension   ? Gastric ulcer 2003; 2012  ? 2003: + esophagitis; negative H.pylori serology  2012: [name] mild gastritis, Bravo PH probe placement, negative H.pylori  ? GERD (gastroesophageal reflux disease)   ? Hepatic steatosis   ? History of hiatal hernia   ? Hyperlipemia   ? Overweight   ? Panic attacks   ? Paroxysmal atrial fibrillation (HCC)   ? Stroke ([facility]   ? TIA (transient ischemic attack)   ? Type II diabetes mellitus ([facility]   ?  ?Past Surgical History:  ?Procedure Laterality Date  ? BALLOON DILATION N/A 07/25/2021  ? Procedure: BALLOON DILATION;  Surgeon: CEloise Harman DO;  [facility];  Service: Endoscopy;  Laterality: N/A;  ? BIOPSY  07/25/2021  ? Procedure: BIOPSY;  Surgeon: Eloise Harman, DO;  [facility];  Service: Endoscopy;;  ? BRAVO [place] STUDY  05/03/2011  ? YWV:PXTG gastritis/normal esophagus and duodenum  ? CARDIAC CATHETERIZATION  1990s X 1; 2005; 08/12/2015  ? CARDIAC CATHETERIZATION N/A 08/12/2015  ? Procedure: Left Heart Cath and Coronary Angiography;  Surgeon: Belva Crome, MD; LAD 20%, CFX 65%, RCA 20%, EF 60%   ? COLONOSCOPY  1990  ? COLONOSCOPY WITH PROPOFOL N/A 11/21/2016  ? Dr. Oneida Alar: non-thrombosed external hemorrhoids, one 6 mm polyp (polypoid lesion), internal hemorrhoids. TI Normal. 10 years  screening  ? ESOPHAGOGASTRODUODENOSCOPY  05/03/2011  ? GYI:RSWN gastritis  ? ESOPHAGOGASTRODUODENOSCOPY (EGD) WITH PROPOFOL N/A 07/25/2021  ? Procedure: ESOPHAGOGASTRODUODENOSCOPY (EGD) WITH PROPOFOL;  Surgeon: Eloise Harman, DO;  [facility];  Service: Endoscopy;  Laterality: N/A;  1:30pm  ? NECK MASS EXCISION Right   ? "done in dr's office; behind right ear/side of ncek"  ? POLYPECTOMY  11/21/2016  ? Procedure: POLYPECTOMY;  Surgeon: [name], MD;  [facility];  Service: Endoscopy;;  descending colon polyp  ? SHOULDER ARTHROSCOPY W/ ROTATOR CUFF REPAIR Right 2006  ? acromioclavicular joint arthrosis  ? ? ?Family Psychiatric History: see below ? ?Family History:  ?Family History  ?Problem Relation Age of Onset  ? Lung cancer Mother   ? Alcohol abuse Mother   ? Heart attack Father 23  ? Diabetes Father   ? Alcohol abuse Father   ? Hypertension Brother   ? Hypertension Brother   ? Anxiety disorder Sister   ? Depression Sister   ? Anxiety disorder Sister   ? Heart attack Brother 64  ? Diabetes Brother   ? Hypertension Brother   ? Seizures Brother   ? Dementia Paternal Uncle   ? Dementia Cousin   ? ADD / ADHD Daughter   ? Colon cancer Neg Hx   ? Drug abuse Neg Hx   ? Bipolar disorder Neg Hx   ? OCD Neg Hx   ? Paranoid behavior Neg Hx   ? Schizophrenia Neg Hx   ? Sexual abuse Neg Hx   ? Physical abuse Neg Hx   ? ? ?Social History:  ?Social History  ? ?Socioeconomic History  ? Marital status: Married  ?  Spouse name: Not on file  ? Number of children: Not on file  ? Years of education: Not on file  ? Highest education level: Not on file  ?Occupational History  ? Occupation: full time  ?  Employer: UNEMPLOYED  ?Tobacco Use  ? Smoking status: Every Day  ?  Packs/day: 0.25  ?  Years: 25.00  ?  Pack years: 6.25  ?  Types: Cigarettes  ?  Start date: 07/17/1982  ? Smokeless tobacco: Never  ? Tobacco comments:  ?  4-6 cigarettes daily 04/27/2021  ?Vaping Use  ? Vaping Use: Never used  ?Substance and  Sexual Activity  ? Alcohol use: No  ?  Alcohol/week: 0.0 standard drinks  ? Drug use: No  ? Sexual activity: Yes  ?  Birth control/protection: None  ?Other Topics Concern  ? Not on file  ?Social History Narrative  ? Pt lives in [place] with wife.  5 children.  Unemployed due to panic attacks and back pain  ? ?Social Determinants of Health  ? ?Financial Resource Strain: Not on file  ?Food Insecurity: Not on file  ?Transportation Needs: Not on  file  ?Physical Activity: Not on file  ?Stress: Not on file  ?Social Connections: Not on file  ? ? ?Allergies:  ?Allergies  ?Allergen Reactions  ? Dexilant [Dexlansoprazole] Anaphylaxis  ? Mushroom Ext Cmplx(Shiitake-Reishi-Mait) Anaphylaxis  ?  Rapid heart rate.  ? Penicillins Anaphylaxis  ?  Has patient had a PCN reaction causing immediate rash, facial/tongue/throat swelling, SOB or lightheadedness with hypotension: Yes ?Has patient had a PCN reaction causing severe rash involving mucus membranes or skin necrosis: No ?Has patient had a PCN reaction that required hospitalization Yes ?Has patient had a PCN reaction occurring within the last 10 years: No ?If all of the above answers are "NO", then may proceed with Cephalosporin use. ?  ? Doxycycline Nausea And Vomiting  ?   ?  ? ? ?Metabolic Disorder Labs: ?Lab Results  ?Component Value Date  ? HGBA1C 5.6 11/09/2021  ? MPG 114.02 11/09/2021  ? MPG 119.76 08/03/2021  ? ?No results found for: PROLACTIN ?Lab Results  ?Component Value Date  ? CHOL 111 11/09/2021  ? TRIG 59 11/09/2021  ? HDL 39 (L) 11/09/2021  ? CHOLHDL 2.8 11/09/2021  ? VLDL 12 11/09/2021  ? [place] 60 11/09/2021  ? [place] 50 08/03/2021  ? ?Lab Results  ?Component Value Date  ? TSH 1.876 11/09/2021  ? TSH 1.230 07/20/2021  ? ? ?Therapeutic Level Labs: ?No results found for: LITHIUM ?No results found for: VALPROATE ?No components found for:  CBMZ ? ?Current Medications: ?Current Outpatient Medications  ?Medication Sig Dispense Refill  ? albuterol (VENTOLIN HFA)  108 (90 Base) MCG/ACT inhaler INHALE 2 PUFFS BY MOUTH EVERY 6 HOURS AS NEEDED FOR COUGHING, WHEEZING, OR SHORTNESS OF BREATH 13.4 g 1  ? ALPRAZolam (XANAX) 0.5 MG tablet Take one twice a day and two a

## 2021-11-30 ENCOUNTER — Encounter: Payer: Self-pay | Admitting: Internal Medicine

## 2021-12-08 ENCOUNTER — Telehealth: Payer: Self-pay | Admitting: Internal Medicine

## 2021-12-08 MED ORDER — SIMVASTATIN 20 MG PO TABS: 20.0000 mg | ORAL_TABLET | Freq: Every day | ORAL | 6 refills | Status: DC

## 2021-12-08 NOTE — Telephone Encounter (Signed)
Pt c/o medication issue:  1. Name of Medication: rosuvastatin (CRESTOR) 5 MG tablet  2. How are you currently taking this medication (dosage and times per day)? Stopped as of last night   3. Are you having a reaction (difficulty breathing--STAT)? Yes  4. What is your medication issue? Per pt's spouse this medication causes a lot of weakness, so pt has stopped taking. Wanting to discuss being switched back to simvastatin.

## 2021-12-08 NOTE — Telephone Encounter (Signed)
I spoke with wife and relayed PharmD message. I sent new rx for simvastatin 20 mg qd to [facility] [place]

## 2021-12-08 NOTE — Telephone Encounter (Signed)
He was changed from simvastatin to rosuvastatin in April 2023 due to reported myalgias on simvastatin. That's fine if he wants to change back to simvastatin if he tolerated it better, his cholesterol was well controlled on it.

## 2021-12-15 ENCOUNTER — Other Ambulatory Visit: Payer: Self-pay | Admitting: Physician Assistant

## 2021-12-15 ENCOUNTER — Other Ambulatory Visit: Payer: Self-pay | Admitting: Cardiology

## 2021-12-15 MED ORDER — ALBUTEROL SULFATE HFA 108 (90 BASE) MCG/ACT IN AERS: INHALATION_SPRAY | RESPIRATORY_TRACT | 0 refills | Status: DC

## 2021-12-15 NOTE — Telephone Encounter (Signed)
Prescription refill request for Xarelto received.  Indication:  PAF Last office visit: 11/03/21  Beckie Salts MD Weight: 77.6kg Age: 56 Scr: 0.95 on 11/09/21 CrCl: 95.30  Based on above findings Xarelto '20mg'$  daily is the appropriate dose.  Refill approved.

## 2021-12-19 ENCOUNTER — Ambulatory Visit (INDEPENDENT_AMBULATORY_CARE_PROVIDER_SITE_OTHER): Payer: Self-pay | Admitting: Clinical

## 2021-12-19 DIAGNOSIS — F4001 Agoraphobia with panic disorder: Secondary | ICD-10-CM

## 2021-12-19 DIAGNOSIS — F419 Anxiety disorder, unspecified: Secondary | ICD-10-CM

## 2021-12-19 DIAGNOSIS — F4310 Post-traumatic stress disorder, unspecified: Secondary | ICD-10-CM

## 2021-12-19 DIAGNOSIS — F331 Major depressive disorder, recurrent, moderate: Secondary | ICD-10-CM

## 2021-12-19 NOTE — Progress Notes (Signed)
Virtual Visit via Telephone Note   I connected with [name] on 12/19/21 at 2:00 PM EST by telephone and verified that I am speaking with the correct person using two identifiers.   Location: Patient: Home Provider: Office   I discussed the limitations, risks, security and privacy concerns of performing an evaluation and management service by telephone and the availability of in person appointments. I also discussed with the patient that there may be a patient responsible charge related to this service. The patient expressed understanding and agreed to proceed.   THERAPIST PROGRESS NOTE     Session Time: 2:00 PM-2:55 PM   Participation Level: Active   Behavioral Response: Casual and Alert,Anxious   Type of Therapy: Individual Therapy   Treatment Goals addressed: Mood and Anxiety   Interventions: CBT   Summary: Richard [name] is a 56 y.o. male who presents with panic disorder/depression with anxiety/and PTSD . The OPT therapist worked with the patient for his OPT treatment. The OPT therapist utilized Motivational Interviewing to assist in creating therapeutic repore. The patient in the session was engaged and work in collaboration giving feedback about his triggers and symptoms over the past few weeks The patient spoke about his  ongoing hardship recently losing a long time family pet.The OPT therapist worked with the patient and reviewed grieving process and taking time to adjust.The OPT therapist utilized Public relations account executive Therapy through cognitive restructuring as well as worked with the patient on coping strategies to assist in management of symptoms as well as reviewed sleep, eating habits, and general health. The patient spoke about the impact of his health on his functioning as well as the heat/high temperatures being a trigger. The OPT therapist worked with the patient on being as functional as he can by being active within his limits at times of lower external heat.    Suicidal/Homicidal: Nowithout intent/plan   Therapist Response:The OPT therapist worked with the patient for the patients scheduled session. The patient was engaged in his session and gave feedback in relation to triggers, symptoms, and behavior responses over the past few weeks. The OPT therapist worked with the patient utilizing an in session Cognitive Behavioral Therapy exercise. The patient was responsive in the session and verbalized, " My family wants me to go to [facility] but I dont think I am going to go I am worried about my health limits and I dont want to ruin their time, its hard because I dont want to let them down but I dont want to push past my limits". The OPT therapist worked with the patient in this session providing support and reviewing with the patient grieving and placing emphasis on the patient working to remain active while using his judgement and being active at low temperature opportunities with the approval of his physican as the patient has cardiac health condition and utilizing his support symptom. The OPT therapist worked with the patient on coping strategies to assist in management of his mood and anxiety.The OPT therapist will continue treatment work with the patient in his  next scheduled session   Plan: Return again in 2/3 weeks.   Diagnosis:      Axis I: PTSD/Panic Disorder/ Depression  Axis II: No diagnosis       Collaboration of Care: No additional collaboration of care for this session.   Patient/Guardian was advised Release of Information must be obtained prior to any record release in order to collaborate their care with an outside provider. Patient/Guardian  was advised if they have not already done so to contact the registration department to sign all necessary forms in order for [name] to release information regarding their care.    Consent: Patient/Guardian gives verbal consent for treatment and assignment of benefits for services provided during this  visit. Patient/Guardian expressed understanding and agreed to proceed.    I discussed the assessment and treatment plan with the patient. The patient was provided an opportunity to ask questions and all were answered. The patient agreed with the plan and demonstrated an understanding of the instructions.   The patient was advised to call back or seek an in-person evaluation if the symptoms worsen or if the condition fails to improve as anticipated.   I provided 55 minutes of non-face-to-face time during this encounter.   [name], LCSW   12/19/2021

## 2021-12-19 NOTE — Plan of Care (Signed)
Verbal Consent 

## 2022-01-02 ENCOUNTER — Ambulatory Visit (INDEPENDENT_AMBULATORY_CARE_PROVIDER_SITE_OTHER): Payer: Self-pay | Admitting: Clinical

## 2022-01-02 DIAGNOSIS — F419 Anxiety disorder, unspecified: Secondary | ICD-10-CM

## 2022-01-02 DIAGNOSIS — F4001 Agoraphobia with panic disorder: Secondary | ICD-10-CM

## 2022-01-02 DIAGNOSIS — F4310 Post-traumatic stress disorder, unspecified: Secondary | ICD-10-CM

## 2022-01-02 DIAGNOSIS — F331 Major depressive disorder, recurrent, moderate: Secondary | ICD-10-CM

## 2022-01-02 NOTE — Progress Notes (Signed)
Virtual Visit via Telephone Note   I connected with Richard [name] on 01/02/22 at 3:00 PM EST by telephone and verified that I am speaking with the correct person using two identifiers.   Location: Patient: Home Provider: Office   I discussed the limitations, risks, security and privacy concerns of performing an evaluation and management service by telephone and the availability of in person appointments. I also discussed with the patient that there may be a patient responsible charge related to this service. The patient expressed understanding and agreed to proceed.   THERAPIST PROGRESS NOTE     Session Time: 3:00 PM-3:55 PM   Participation Level: Active   Behavioral Response: Casual and Alert,Anxious   Type of Therapy: Individual Therapy   Treatment Goals addressed: Mood and Anxiety   Interventions: CBT   Summary: Richard [name] is a 56 y.o. male who presents with panic disorder/depression with anxiety/and PTSD . The OPT therapist worked with the patient for his OPT treatment. The OPT therapist utilized Motivational Interviewing to assist in creating therapeutic repore. The patient in the session was engaged and work in collaboration giving feedback about his triggers and symptoms over the past few weeks The patient spoke about his difficulty with the humidity and the recent Summer weather. The patient is still recovering from losing a cherished pet.The OPT therapist utilized Cognitive Behavioral Therapy through cognitive restructuring as well as worked with the patient on coping strategies to assist in management of symptoms as well as reviewed sleep, eating habits, and general health. The OPT therapist worked with the patient on being as functional as he can by being active within his limits at times of lower external heat.   Suicidal/Homicidal: Nowithout intent/plan   Therapist Response:The OPT therapist worked with the patient for the patients scheduled session. The patient was  engaged in his session and gave feedback in relation to triggers, symptoms, and behavior responses over the past few weeks. The OPT therapist worked with the patient utilizing an in session Cognitive Behavioral Therapy exercise. The patient was responsive in the session and verbalized, " I cant do things outside in the Winter time and then its to hot in the Summer and I do not have but so much money I have not worked in the past 10 years, I feel like to my family my kids do not understand I am limited and with my health and I cant do the same things I use to do". The OPT therapist worked with the patient in this session providing support and reviewing with the patient grieving and placing emphasis on the patient working to remain active while using his judgement and being active at low temperature opportunities with the approval of his physican as the patient has cardiac health condition and utilizing his support symptom. The OPT therapist worked with the patient on coping strategies to assist in management of his mood and anxiety.The OPT therapist will continue treatment work with the patient in his  next scheduled session   Plan: Return again in 2/3 weeks.   Diagnosis:      Axis I: PTSD/Panic Disorder/ Depression  Axis II: No diagnosis       Collaboration of Care: No additional collaboration of care for this session.   Patient/Guardian was advised Release of Information must be obtained prior to any record release in order to collaborate their care with an outside provider. Patient/Guardian was advised if they have not already done so to contact the registration department to sign  all necessary forms in order for [name] to release information regarding their care.    Consent: Patient/Guardian gives verbal consent for treatment and assignment of benefits for services provided during this visit. Patient/Guardian expressed understanding and agreed to proceed.    I discussed the assessment and treatment  plan with the patient. The patient was provided an opportunity to ask questions and all were answered. The patient agreed with the plan and demonstrated an understanding of the instructions.   The patient was advised to call back or seek an in-person evaluation if the symptoms worsen or if the condition fails to improve as anticipated.   I provided 55 minutes of non-face-to-face time during this encounter.   [name], LCSW   01/02/2022

## 2022-01-04 ENCOUNTER — Telehealth: Payer: Self-pay | Admitting: Cardiology

## 2022-01-04 NOTE — Telephone Encounter (Signed)
Pt c/o medication issue:  1. Name of Medication: dronedarone (MULTAQ) 400 MG tablet  2. How are you currently taking this medication (dosage and times per day)? Take 1 tablet (400 mg total) by mouth 2 (two) times daily with a meal.  3. Are you having a reaction (difficulty breathing--STAT)? no  4. What is your medication issue? Wife called to say this is suppose to be direct sent to the company. Please advise

## 2022-01-04 NOTE — Telephone Encounter (Signed)
Returned call to pt no answer. Left msg to call back.

## 2022-01-16 ENCOUNTER — Telehealth: Payer: Self-pay | Admitting: Internal Medicine

## 2022-01-16 NOTE — Telephone Encounter (Signed)
Notified wife that we will provide samples until shipment gets here.

## 2022-01-16 NOTE — Telephone Encounter (Signed)
2 Sample boxes Lot # [id number], Exp: 11/2023. Number 12 tablets

## 2022-01-16 NOTE — Telephone Encounter (Signed)
Patient's wife called about medication that was to come to our office.  She is calling to found out status of it.  Patient has run out of the dronedarone (MULTAQ) 400 MG tablet.

## 2022-01-19 NOTE — Telephone Encounter (Signed)
*  STAT* If patient is at the pharmacy, call can be transferred to refill team.   1. Which medications need to be refilled? (please list name of each medication and dose if known)   dronedarone (MULTAQ) 400 MG tablet  2. Which pharmacy/location (including street and city if local pharmacy) is medication to be sent to?  [place], [place] [address]  3. Do they need a 30 day or 90 day supply?   30 day   Wife called to follow-up to see if samples of dronedarone (MULTAQ) 400 MG tablet were received.  She noted the patient will be out of medication by Sunday.

## 2022-01-20 ENCOUNTER — Telehealth: Payer: Self-pay | Admitting: Internal Medicine

## 2022-01-20 NOTE — Telephone Encounter (Signed)
Spoke to pt's wife and informed her that pt's medication has not come in yet. Samples provided.  [employer] Lot Number: [id number] Exp: 11/2023

## 2022-01-20 NOTE — Telephone Encounter (Signed)
Pt c/o medication issue:  1. Name of Medication:   dronedarone (MULTAQ) 400 MG tablet    2. How are you currently taking this medication (dosage and times per day)? Take 1 tablet (400 mg total) by mouth 2 (two) times daily with a meal.  3. Are you having a reaction (difficulty breathing--STAT)? No  4. What is your medication issue? Spouse calling to see if medication has come in yet. Spouse states that if it has not they would like to receive more samples due to pt getting ready to run out on Sunday.   Spouse would like a call back as soon as possible being that she has to be at work by 12 noon. Please advise

## 2022-01-25 NOTE — Telephone Encounter (Signed)
Pt notified that medication has arrived to [facility] and will be available tomorrow for pick up.

## 2022-01-25 NOTE — Telephone Encounter (Signed)
Wife is following-up to check if the medication has come in as yet.

## 2022-01-30 ENCOUNTER — Ambulatory Visit (INDEPENDENT_AMBULATORY_CARE_PROVIDER_SITE_OTHER): Payer: Self-pay | Admitting: Clinical

## 2022-01-30 DIAGNOSIS — F4001 Agoraphobia with panic disorder: Secondary | ICD-10-CM

## 2022-01-30 DIAGNOSIS — F419 Anxiety disorder, unspecified: Secondary | ICD-10-CM

## 2022-01-30 DIAGNOSIS — F4310 Post-traumatic stress disorder, unspecified: Secondary | ICD-10-CM

## 2022-01-30 DIAGNOSIS — F331 Major depressive disorder, recurrent, moderate: Secondary | ICD-10-CM

## 2022-01-30 NOTE — Progress Notes (Signed)
Virtual Visit via Telephone Note   I connected with Richard [name] on 01/30/22 at 3:00 PM EST by telephone and verified that I am speaking with the correct person using two identifiers.   Location: Patient: Home Provider: Office   I discussed the limitations, risks, security and privacy concerns of performing an evaluation and management service by telephone and the availability of in person appointments. I also discussed with the patient that there may be a patient responsible charge related to this service. The patient expressed understanding and agreed to proceed.   THERAPIST PROGRESS NOTE     Session Time: 3:00 PM-3:55 PM   Participation Level: Active   Behavioral Response: Casual and Alert,Anxious   Type of Therapy: Individual Therapy   Treatment Goals addressed: Mood and Anxiety   Interventions: CBT   Summary: [name] is a 56 y.o. male who presents with panic disorder/depression with anxiety/and PTSD . The OPT therapist worked with the patient for his OPT treatment. The OPT therapist utilized Motivational Interviewing to assist in creating therapeutic repore. The patient in the session was engaged and work in collaboration giving feedback about his triggers and symptoms over the past few weeks The patient spoke about his ongoing difficulty with the humidity and the recent Summer weather. the patient spoke about working to be active within his limits.The OPT therapist utilized Cognitive Behavioral Therapy through cognitive restructuring as well as worked with the patient on coping strategies to assist in management of symptoms as well as reviewed sleep, eating habits, and general health.    Suicidal/Homicidal: Nowithout intent/plan   Therapist Response:The OPT therapist worked with the patient for the patients scheduled session. The patient was engaged in his session and gave feedback in relation to triggers, symptoms, and behavior responses over the past few weeks. The OPT  therapist worked with the patient utilizing an in session Cognitive Behavioral Therapy exercise. The patient was responsive in the session and verbalized, " I am trying to adjust to realizing there are things I cant do like I use to and that is the hard thing mentally cause I am the type of person that was always independent". The OPT therapist worked with the patient in this session providing support and reviewing with the patient grieving and placing emphasis on the patient working to remain active while using his judgement and being active at low temperature opportunities with the approval of his physican as the patient has cardiac health condition and utilizing his support symptom. The OPT therapist worked with the patient on coping strategies to assist in management of his mood and anxiety.The OPT therapist will continue treatment work with the patient in his  next scheduled session   Plan: Return again in 2/3 weeks.   Diagnosis:      Axis I: PTSD/Panic Disorder/ Depression  Axis II: No diagnosis       Collaboration of Care: No additional collaboration of care for this session.   Patient/Guardian was advised Release of Information must be obtained prior to any record release in order to collaborate their care with an outside provider. Patient/Guardian was advised if they have not already done so to contact the registration department to sign all necessary forms in order for [name] to release information regarding their care.    Consent: Patient/Guardian gives verbal consent for treatment and assignment of benefits for services provided during this visit. Patient/Guardian expressed understanding and agreed to proceed.    I discussed the assessment and treatment plan with the  patient. The patient was provided an opportunity to ask questions and all were answered. The patient agreed with the plan and demonstrated an understanding of the instructions.   The patient was advised to call back or seek an  in-person evaluation if the symptoms worsen or if the condition fails to improve as anticipated.   I provided  minutes of non-face-to-face time during this encounter.   [name], LCSW   01/30/2022

## 2022-02-09 ENCOUNTER — Other Ambulatory Visit: Payer: Self-pay

## 2022-02-15 ENCOUNTER — Encounter: Payer: Self-pay | Admitting: Physician Assistant

## 2022-02-15 ENCOUNTER — Ambulatory Visit: Payer: Self-pay | Admitting: Physician Assistant

## 2022-02-15 VITALS — BP 130/70 | HR 62 | Temp 96.1°F | Wt 172.0 lb

## 2022-02-15 DIAGNOSIS — F17200 Nicotine dependence, unspecified, uncomplicated: Secondary | ICD-10-CM

## 2022-02-15 DIAGNOSIS — I2510 Atherosclerotic heart disease of native coronary artery without angina pectoris: Secondary | ICD-10-CM

## 2022-02-15 DIAGNOSIS — R9389 Abnormal findings on diagnostic imaging of other specified body structures: Secondary | ICD-10-CM

## 2022-02-15 DIAGNOSIS — E119 Type 2 diabetes mellitus without complications: Secondary | ICD-10-CM

## 2022-02-15 DIAGNOSIS — E785 Hyperlipidemia, unspecified: Secondary | ICD-10-CM

## 2022-02-15 DIAGNOSIS — R918 Other nonspecific abnormal finding of lung field: Secondary | ICD-10-CM

## 2022-02-15 DIAGNOSIS — I10 Essential (primary) hypertension: Secondary | ICD-10-CM

## 2022-02-15 NOTE — Progress Notes (Signed)
BP 130/70   Pulse 62   Temp (!) 96.1 F (35.6 C)   Wt 172 lb (78 kg)   SpO2 98%   BMI 23.99 kg/m    Subjective:    Patient ID: Richard [name], male    DOB: 1965-08-08, 56 y.o.   MRN: [id number]  HPI: Richard [name] is a 56 y.o. male presenting on 02/15/2022 for Skin Problem (Pt c/o dry flaky skin on L hand. Pt also has a dark spot on his L hand and is concerned since it has gotten darker and bigger)   HPI Chief Complaint  Patient presents with   Skin Problem    Pt c/o dry flaky skin on L hand. Pt also has a dark spot on his L hand and is concerned since it has gotten darker and bigger     He is here for routine f/u  He is  seeing counselor.  He is keeping up with his his cardiologist and gastroentereologist.  Pt declines [facility] eye appt in [place]- even knowing that he will have to wait until 2024.   He is Still smoking some  Pt says he pulled his back recently when he picked up his grandchild.    Relevant past medical, surgical, family and social history reviewed and updated as indicated. Interim medical history since our last visit reviewed. Allergies and medications reviewed and updated.    Current Outpatient Medications:    albuterol (VENTOLIN HFA) 108 (90 Base) MCG/ACT inhaler, INHALE 2 PUFFS BY MOUTH EVERY 6 HOURS AS NEEDED FOR COUGHING, WHEEZING, OR SHORTNESS OF BREATH, Disp: 3 each, Rfl: 0   ALPRAZolam (XANAX) 0.5 MG tablet, Take one twice a day and two at bedtime, Disp: 120 tablet, Rfl: 2   amLODipine (NORVASC) 10 MG tablet, TAKE 1 Tablet BY MOUTH ONCE DAILY, Disp: 90 tablet, Rfl: 1   dronedarone (MULTAQ) 400 MG tablet, Take 1 tablet (400 mg total) by mouth 2 (two) times daily with a meal., Disp: 60 tablet, Rfl: 11   esomeprazole (NEXIUM) 40 MG capsule, Take 1 capsule (40 mg total) by mouth 2 (two) times daily before a meal., Disp: 60 capsule, Rfl: 11   famotidine (PEPCID) 20 MG tablet, Take 1 tablet (20 mg total) by mouth 2 (two) times daily. (Patient taking  differently: Take 20 mg by mouth 3 (three) times daily.), Disp: 180 tablet, Rfl: 3   metoprolol succinate (TOPROL XL) 25 MG 24 hr tablet, Take 0.5 tablets (12.5 mg total) by mouth daily., Disp: 45 tablet, Rfl: 3   nitroGLYCERIN (NITROSTAT) 0.4 MG SL tablet, Place 1 tablet (0.4 mg total) under the tongue every 5 (five) minutes x 3 doses as needed for chest pain (if no relief after 3rd dose, proceed to the ED for an evaluation)., Disp: 25 tablet, Rfl: 3   simvastatin (ZOCOR) 20 MG tablet, Take 1 tablet (20 mg total) by mouth daily., Disp: 30 tablet, Rfl: 6   vitamin B-12 (CYANOCOBALAMIN) 50 MCG tablet, Take 50 mcg by mouth daily., Disp: , Rfl:    VITAMIN D PO, Take 1 tablet by mouth daily., Disp: , Rfl:    XARELTO 20 MG TABS tablet, TAKE 1 Tablet BY MOUTH ONCE EVERY DAY WITH SUPPER, Disp: 90 tablet, Rfl: 1    Review of Systems  Per HPI unless specifically indicated above     Objective:    BP 130/70   Pulse 62   Temp (!) 96.1 F (35.6 C)   Wt 172 lb (78 kg)  SpO2 98%   BMI 23.99 kg/m   Wt Readings from Last 3 Encounters:  02/15/22 172 lb (78 kg)  11/17/21 172 lb 9.6 oz (78.3 kg)  11/15/21 172 lb (78 kg)    Physical Exam Vitals reviewed.  Constitutional:      General: He is not in acute distress.    Appearance: He is well-developed. He is not ill-appearing.  HENT:     Head: Normocephalic and atraumatic.  Cardiovascular:     Rate and Rhythm: Normal rate and regular rhythm.  Pulmonary:     Effort: Pulmonary effort is normal.     Breath sounds: Normal breath sounds. No wheezing.  Abdominal:     General: Bowel sounds are normal.     Palpations: Abdomen is soft.     Tenderness: There is no abdominal tenderness.  Musculoskeletal:     Cervical back: Neck supple.     Lumbar back: Tenderness present. No bony tenderness.     Right lower leg: No edema.     Left lower leg: No edema.     Comments: Soft tissue tenderness right lumbar back  Lymphadenopathy:     Cervical: No  cervical adenopathy.  Skin:    General: Skin is warm and dry.  Neurological:     Mental Status: He is alert and oriented to person, place, and time.  Psychiatric:        Behavior: Behavior normal.           Assessment & Plan:    Encounter Diagnoses  Name Primary?   Coronary artery disease involving native heart without angina pectoris, unspecified vessel or lesion type Yes   Hyperlipidemia, unspecified hyperlipidemia type    Diet-controlled diabetes mellitus ([place])    Essential hypertension    Tobacco use disorder    Abnormal chest CT    Pulmonary nodules       -Check a1c and lipids.  Pt will be called with results -pt given ruler and encouraged to notify office if spot on hand gets larger. Discussed it is 57m and appears to be sun damage spot (photo in media dated today) -CT march- pulmonary nodules- repeat CT recommended march 2024.  Pt is encouraged to stop smoking -no changes to medication -pt to follow up 4 months.  He is to contact office sooner prn

## 2022-02-16 ENCOUNTER — Other Ambulatory Visit (HOSPITAL_COMMUNITY)
Admission: RE | Admit: 2022-02-16 | Discharge: 2022-02-16 | Disposition: A | Discharge status: Home / Self Care | Payer: Self-pay | Source: Ambulatory Visit | Attending: Physician Assistant | Admitting: Physician Assistant

## 2022-02-16 DIAGNOSIS — I10 Essential (primary) hypertension: Secondary | ICD-10-CM | POA: Insufficient documentation

## 2022-02-16 DIAGNOSIS — E119 Type 2 diabetes mellitus without complications: Secondary | ICD-10-CM | POA: Insufficient documentation

## 2022-02-16 DIAGNOSIS — I2510 Atherosclerotic heart disease of native coronary artery without angina pectoris: Secondary | ICD-10-CM | POA: Insufficient documentation

## 2022-02-16 DIAGNOSIS — E785 Hyperlipidemia, unspecified: Secondary | ICD-10-CM | POA: Insufficient documentation

## 2022-02-16 LAB — COMPREHENSIVE METABOLIC PANEL
ALT: 34 U/L (ref 0–44)
AST: 17 U/L (ref 15–41)
Albumin: 4.2 g/dL (ref 3.5–5.0)
Alkaline Phosphatase: 51 U/L (ref 38–126)
Anion gap: 6 (ref 5–15)
BUN: 14 mg/dL (ref 6–20)
CO2: 27 mmol/L (ref 22–32)
Calcium: 9.2 mg/dL (ref 8.9–10.3)
Chloride: 107 mmol/L (ref 98–111)
Creatinine, Ser: 0.98 mg/dL (ref 0.61–1.24)
GFR, Estimated: >60 mL/min (ref >60)
Glucose, Bld: 110 mg/dL — ABNORMAL HIGH (ref 70–99)
Potassium: 3.9 mmol/L (ref 3.5–5.1)
Sodium: 140 mmol/L (ref 135–145)
Total Bilirubin: 0.8 mg/dL (ref 0.3–1.2)
Total Protein: 7.3 g/dL (ref 6.5–8.1)

## 2022-02-16 LAB — HEMOGLOBIN A1C
Hgb A1c MFr Bld: 5.5 % (ref 4.8–5.6)
Mean Plasma Glucose: 111.15 mg/dL

## 2022-02-16 LAB — LIPID PANEL
Cholesterol: 111 mg/dL (ref 0–200)
HDL: 38 mg/dL — ABNORMAL LOW (ref >40)
LDL Cholesterol: 61 mg/dL (ref 0–99)
Total CHOL/HDL Ratio: 2.9 RATIO
Triglycerides: 59 mg/dL (ref <150)
VLDL: 12 mg/dL (ref 0–40)

## 2022-02-17 LAB — MICROALBUMIN, URINE: Microalb, Ur: 8.7 ug/mL — ABNORMAL HIGH

## 2022-02-20 ENCOUNTER — Telehealth: Payer: Self-pay | Admitting: Physician Assistant

## 2022-02-20 ENCOUNTER — Telehealth: Payer: Self-pay | Admitting: Internal Medicine

## 2022-02-20 DIAGNOSIS — J439 Emphysema, unspecified: Secondary | ICD-10-CM

## 2022-02-20 MED ORDER — FLUTICASONE-SALMETEROL 100-50 MCG/ACT IN AEPB: 1.0000 | INHALATION_SPRAY | Freq: Two times a day (BID) | RESPIRATORY_TRACT | 1 refills | Status: DC

## 2022-02-20 NOTE — Telephone Encounter (Signed)
Patient's wife is calling requesting a referral from [name] to a lung doctor in [place] for the patient to be seen regarding his emphysema. She states his PCP acts as if he is doesn't need one, but [name] had asked him if he was established with one at his last appointment. They have transportation issues so they are wanting it to be in [place] only. Please advise.

## 2022-02-20 NOTE — Telephone Encounter (Signed)
Patients wife notified and verbalized understanding.  Pt referred to Pulmonary per [name].

## 2022-02-20 NOTE — Telephone Encounter (Signed)
Called pt about lab results.  Noted request to cards for referral to pulm (pt wife called the cardiologist).   Discussed with pt that he had OV here last week and he never mentioned issues.  Discussed his sob when goes outside in the hot heat and limited functioning and increased use of albuterol mdi.  Will start on fluticasone/salmeterol.  Discussed with pt referral to pulmonologist not indicated at present.  Also recommended he contact this office/his PCP in future if he feels he needs referral.  Pt states understanding and is in agreement.  Also encouraged smoking cessation.

## 2022-02-21 ENCOUNTER — Telehealth (INDEPENDENT_AMBULATORY_CARE_PROVIDER_SITE_OTHER): Payer: Self-pay | Admitting: Psychiatry

## 2022-02-21 ENCOUNTER — Encounter (HOSPITAL_COMMUNITY): Payer: Self-pay | Admitting: Psychiatry

## 2022-02-21 DIAGNOSIS — F4001 Agoraphobia with panic disorder: Secondary | ICD-10-CM

## 2022-02-21 DIAGNOSIS — F4310 Post-traumatic stress disorder, unspecified: Secondary | ICD-10-CM

## 2022-02-21 MED ORDER — ALPRAZOLAM 0.5 MG PO TABS: ORAL_TABLET | ORAL | 2 refills | Status: DC

## 2022-02-21 NOTE — Progress Notes (Signed)
Virtual Visit via Telephone Note  I connected with Blackwell on 02/21/22 at  1:20 PM EDT by telephone and verified that I am speaking with the correct person using two identifiers.  Location: Patient: home Provider: office   I discussed the limitations, risks, security and privacy concerns of performing an evaluation and management service by telephone and the availability of in person appointments. I also discussed with the patient that there may be a patient responsible charge related to this service. The patient expressed understanding and agreed to proceed.       I discussed the assessment and treatment plan with the patient. The patient was provided an opportunity to ask questions and all were answered. The patient agreed with the plan and demonstrated an understanding of the instructions.   The patient was advised to call back or seek an in-person evaluation if the symptoms worsen or if the condition fails to improve as anticipated.  I provided 15 minutes of non-face-to-face time during this encounter.   [name], MD  [facility] MD/PA/NP OP Progress Note  02/21/2022 1:39 PM Richard [name]  MRN:  [id number]  Chief Complaint:  Chief Complaint  Patient presents with   Anxiety   Follow-up   HPI: This patient is a 56 year old Caucasian male who lives with his wife in [place].  He is unemployed.  The patient returns for follow-up after 3 months.  He and his wife are really struggling financially.  She is not getting many hours at her job.  She is trying to get unemployment.  Neither of them have been able to get disability and the patient states he tried 7 times.  The Xanax does continue to help with his anxiety and he denies serious depression or thoughts of self-harm or suicidal ideation.  He is still having a lot of trouble with breathing.   Visit Diagnosis:    ICD-10-CM   1. PTSD (post-traumatic stress disorder)  F43.10     2. 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 Cardiac cath 07/2015 showed 65% distal Cx, 20% mid-distal LAD, 20% prox-distal RCA, EF 60%, EDP 68mHg.   Colitis 1990   COPD (chronic obstructive pulmonary disease) ([place]    Depression    Essential hypertension    Gastric ulcer 2003; 2012   2003: + esophagitis; negative H.pylori serology  2012: [name] mild gastritis, Bravo PH probe placement, negative H.pylori   GERD (gastroesophageal reflux disease)    Hepatic steatosis    History of hiatal hernia    Hyperlipemia    Overweight    Panic attacks    Paroxysmal atrial fibrillation ([place]    Stroke ([place]    TIA (transient ischemic attack)    Type II diabetes mellitus ([facility]     Past Surgical History:  Procedure Laterality Date   BALLOON DILATION N/A 07/25/2021   Procedure: BALLOON DILATION;  Surgeon: CEloise Harman DO;  [facility];  Service: Endoscopy;  Laterality: N/A;   BIOPSY  07/25/2021   Procedure: BIOPSY;  Surgeon: CEloise Harman DO;  Location[facility];  Service: Endoscopy;;   BRAVO PStarkSTUDY  05/03/2011   SOFB:PZWCgastritis/normal esophagus and duodenum   CARDIAC CATHETERIZATION  1990s X 1; 2005; 08/12/2015  CARDIAC CATHETERIZATION N/A 08/12/2015   Procedure: Left Heart Cath and Coronary Angiography;  Surgeon: Belva Crome, MD; LAD 20%, CFX 65%, RCA 20%, EF 60%    COLONOSCOPY  1990   COLONOSCOPY WITH PROPOFOL N/A 11/21/2016   Dr. Oneida Alar: non-thrombosed external hemorrhoids, one 6 mm polyp (polypoid lesion), internal hemorrhoids. TI Normal. 10 years screening   ESOPHAGOGASTRODUODENOSCOPY  05/03/2011   WRU:EAVW gastritis   ESOPHAGOGASTRODUODENOSCOPY (EGD) WITH PROPOFOL N/A 07/25/2021   Procedure: ESOPHAGOGASTRODUODENOSCOPY (EGD) WITH PROPOFOL;  Surgeon: Eloise Harman, DO;  [facility];  Service:  Endoscopy;  Laterality: N/A;  1:30pm   NECK MASS EXCISION Right    "done in dr's office; behind right ear/side of ncek"   POLYPECTOMY  11/21/2016   Procedure: POLYPECTOMY;  Surgeon: [name], MD;  Location[facility];  Service: Endoscopy;;  descending colon polyp   SHOULDER ARTHROSCOPY W/ ROTATOR CUFF REPAIR Right 2006   acromioclavicular joint arthrosis    Family Psychiatric History: See below  Family History:  Family History  Problem Relation Age of Onset   Lung cancer Mother    Alcohol abuse Mother    Heart attack Father 38   Diabetes Father    Alcohol abuse Father    Hypertension Brother    Hypertension Brother    Anxiety disorder Sister    Depression Sister    Anxiety disorder Sister    Heart attack Brother 45   Diabetes Brother    Hypertension Brother    Seizures Brother    Dementia Paternal Uncle    Dementia Cousin    ADD / ADHD Daughter    Colon cancer Neg Hx    Drug abuse Neg Hx    Bipolar disorder Neg Hx    OCD Neg Hx    Paranoid behavior Neg Hx    Schizophrenia Neg Hx    Sexual abuse Neg Hx    Physical abuse Neg Hx     Social History:  Social History   Socioeconomic History   Marital status: Married    Spouse name: Not on file   Number of children: Not on file   Years of education: Not on file   Highest education level: Not on file  Occupational History   Occupation: full time    Employer: UNEMPLOYED  Tobacco Use   Smoking status: Every Day    Packs/day: 0.25    Years: 25.00    Total pack years: 6.25    Types: Cigarettes    Start date: 07/17/1982   Smokeless tobacco: Never   Tobacco comments:    4-6 cigarettes daily 04/27/2021  Vaping Use   Vaping Use: Never used  Substance and Sexual Activity   Alcohol use: No    Alcohol/week: 0.0 standard drinks of alcohol   Drug use: No   Sexual activity: Yes    Birth control/protection: None  Other Topics Concern   Not on file  Social History Narrative   Pt lives in [place] with  wife.  5 children.  Unemployed due to panic attacks and back pain   Social Determinants of Health   Financial Resource Strain: Not on file  Food Insecurity: Not on file  Transportation Needs: Not on file  Physical Activity: Not on file  Stress: Not on file  Social Connections: Not on file    Allergies:  Allergies  Allergen Reactions   Dexilant [Dexlansoprazole] Anaphylaxis   Mushroom Ext Cmplx(Shiitake-Reishi-Mait) Anaphylaxis    Rapid heart rate.   Penicillins Anaphylaxis  Has patient had a PCN reaction causing immediate rash, facial/tongue/throat swelling, SOB or lightheadedness with hypotension: Yes Has patient had a PCN reaction causing severe rash involving mucus membranes or skin necrosis: No Has patient had a PCN reaction that required hospitalization Yes Has patient had a PCN reaction occurring within the last 10 years: No If all of the above answers are "NO", then may proceed with Cephalosporin use.    Doxycycline Nausea And Vomiting         Metabolic Disorder Labs: Lab Results  Component Value Date   HGBA1C 5.5 02/16/2022   MPG 111.15 02/16/2022   MPG 114.02 11/09/2021   No results found for: "PROLACTIN" Lab Results  Component Value Date   CHOL 111 02/16/2022   TRIG 59 02/16/2022   HDL 38 (L) 02/16/2022   CHOLHDL 2.9 02/16/2022   VLDL 12 02/16/2022   LDLCALC 61 02/16/2022   LDLCALC 60 11/09/2021   Lab Results  Component Value Date   TSH 1.876 11/09/2021   TSH 1.230 07/20/2021    Therapeutic Level Labs: No results found for: "LITHIUM" No results found for: "VALPROATE" No results found for: "CBMZ"  Current Medications: Current Outpatient Medications  Medication Sig Dispense Refill   albuterol (VENTOLIN HFA) 108 (90 Base) MCG/ACT inhaler INHALE 2 PUFFS BY MOUTH EVERY 6 HOURS AS NEEDED FOR COUGHING, WHEEZING, OR SHORTNESS OF BREATH 3 each 0   ALPRAZolam (XANAX) 0.5 MG tablet Take one twice a day and two at bedtime 120 tablet 2   amLODipine  (NORVASC) 10 MG tablet TAKE 1 Tablet BY MOUTH ONCE DAILY 90 tablet 1   dronedarone (MULTAQ) 400 MG tablet Take 1 tablet (400 mg total) by mouth 2 (two) times daily with a meal. 60 tablet 11   esomeprazole (NEXIUM) 40 MG capsule Take 1 capsule (40 mg total) by mouth 2 (two) times daily before a meal. 60 capsule 11   famotidine (PEPCID) 20 MG tablet Take 1 tablet (20 mg total) by mouth 2 (two) times daily. (Patient taking differently: Take 20 mg by mouth 3 (three) times daily.) 180 tablet 3   fluticasone-salmeterol (ADVAIR) 100-50 MCG/ACT AEPB Inhale 1 puff into the lungs 2 (two) times daily. 3 each 1   metoprolol succinate (TOPROL XL) 25 MG 24 hr tablet Take 0.5 tablets (12.5 mg total) by mouth daily. 45 tablet 3   nitroGLYCERIN (NITROSTAT) 0.4 MG SL tablet Place 1 tablet (0.4 mg total) under the tongue every 5 (five) minutes x 3 doses as needed for chest pain (if no relief after 3rd dose, proceed to the ED for an evaluation). 25 tablet 3   simvastatin (ZOCOR) 20 MG tablet Take 1 tablet (20 mg total) by mouth daily. 30 tablet 6   vitamin B-12 (CYANOCOBALAMIN) 50 MCG tablet Take 50 mcg by mouth daily.     VITAMIN D PO Take 1 tablet by mouth daily.     XARELTO 20 MG TABS tablet TAKE 1 Tablet BY MOUTH ONCE EVERY DAY WITH SUPPER 90 tablet 1   No current facility-administered medications for this visit.     Musculoskeletal: Strength & Muscle Tone: na Gait & Station: na Patient leans: N/A  Psychiatric Specialty Exam: Review of Systems  Constitutional:  Positive for unexpected weight change.  Respiratory:  Positive for shortness of breath.   Cardiovascular:  Positive for palpitations.  Psychiatric/Behavioral:  The patient is nervous/anxious.   All other systems reviewed and are negative.   There were no vitals taken for this visit.There is no height  or weight on file to calculate BMI.  General Appearance: NA  Eye Contact:  NA  Speech:  Clear and Coherent  Volume:  Normal  Mood:  Anxious   Affect:  NA  Thought Process:  Goal Directed  Orientation:  Full (Time, Place, and Person)  Thought Content: Rumination   Suicidal Thoughts:  No  Homicidal Thoughts:  No  Memory:  Immediate;   Good Recent;   Good Remote;   Fair  Judgement:  Good  Insight:  Fair  Psychomotor Activity:  Decreased  Concentration:  Concentration: Fair and Attention Span: Fair  Recall:  [place] of Knowledge: Good  Language: Good  Akathisia:  No  Handed:  Right  AIMS (if indicated): not done  Assets:  Communication Skills Desire for Improvement Resilience Social Support Talents/Skills  ADL's:  Intact  Cognition: WNL  Sleep:  Fair   Screenings: GAD-7    Health and safety inspector from 08/24/2021 in [facility] [facility]  Total GAD-7 Score 20      PHQ2-9    Flowsheet Row Video Visit from 11/29/2021 in [facility] [facility] Video Visit from 09/06/2021 in [place] Counselor from 08/24/2021 in [facility] [facility] Video Visit from 06/16/2021 in [facility] Patient Outreach Telephone from 08/30/2015 in [facility]  PHQ-2 Total Score '1 5 4 1 2  '$ PHQ-9 Total Score -- 12 16 -- 8      Flowsheet Row Video Visit from 11/29/2021 in [facility] from 09/27/2021 in [facility] Video Visit from 09/06/2021 in [place] No Risk No Risk No Risk        Assessment and Plan: This patient is a 56 year old male with a history of anxiety and panic attacks.  He is very stressed regarding finances and health problems right now.  He does think the Xanax is helpful.  Therefore he will continue Xanax 0.5 mg twice daily and 1 mg at bedtime.  He will return to see me in 3 months  Collaboration  of Care: Collaboration of Care: Referral or follow-up with counselor/therapist AEB patient will continue therapy with Maye Hides in our office  Patient/Guardian was advised Release of Information must be obtained prior to any record release in order to collaborate their care with an outside provider. Patient/Guardian was advised if they have not already done so to contact the registration department to sign all necessary forms in order for [name] to release information regarding their care.   Consent: Patient/Guardian gives verbal consent for treatment and assignment of benefits for services provided during this visit. Patient/Guardian expressed understanding and agreed to proceed.    [name], MD 02/21/2022, 1:39 PM

## 2022-03-06 ENCOUNTER — Telehealth (INDEPENDENT_AMBULATORY_CARE_PROVIDER_SITE_OTHER): Payer: Self-pay | Admitting: Clinical

## 2022-03-06 ENCOUNTER — Other Ambulatory Visit: Payer: Self-pay | Admitting: Physician Assistant

## 2022-03-06 DIAGNOSIS — F4310 Post-traumatic stress disorder, unspecified: Secondary | ICD-10-CM

## 2022-03-06 DIAGNOSIS — F4001 Agoraphobia with panic disorder: Secondary | ICD-10-CM

## 2022-03-06 NOTE — Progress Notes (Signed)
Virtual Visit via Telephone Note   I connected with [place] on 03/06/22 at 3:00 PM EST by telephone and verified that I am speaking with the correct person using two identifiers.   Location: Patient: Home Provider: Office   I discussed the limitations, risks, security and privacy concerns of performing an evaluation and management service by telephone and the availability of in person appointments. I also discussed with the patient that there may be a patient responsible charge related to this service. The patient expressed understanding and agreed to proceed.   THERAPIST PROGRESS NOTE     Session Time: 3:00 PM-3:55 PM   Participation Level: Active   Behavioral Response: Casual and Alert,Anxious   Type of Therapy: Individual Therapy   Treatment Goals addressed: Mood and Anxiety   Interventions: CBT   Summary: Richard [name] is a 56 y.o. male who presents with panic disorder/depression with anxiety/and PTSD . The OPT therapist worked with the patient for his OPT treatment. The OPT therapist utilized Motivational Interviewing to assist in creating therapeutic repore. The patient in the session was engaged and work in collaboration giving feedback about his triggers and symptoms over the past few weeks The patient spoke about his ongoing difficulty with the humidity and the recent Summer weather. The patient reviewed a recent panic attack at the local [place].The patient spoke about working to be active within his limits.The OPT therapist utilized Cognitive Behavioral Therapy through cognitive restructuring as well as worked with the patient on coping strategies to assist in management of symptoms as well as reviewed sleep, eating habits, and general health.    Suicidal/Homicidal: Nowithout intent/plan   Therapist Response:The OPT therapist worked with the patient for the patients scheduled session. The patient was engaged in his session and gave feedback in relation to triggers,  symptoms, and behavior responses over the past few weeks. The OPT therapist worked with the patient utilizing an in session Cognitive Behavioral Therapy exercise. The patient was responsive in the session and verbalized, " I still struggle with taking breaks when I need I have the knowledge but my body is giving up and I give out and It causes me to not be able to push myself".The OPT therapist worked with the patient in this session providing support and reviewing with the patient grieving and placing emphasis on the patient working to remain active while using his judgement and being active at low temperature opportunities with the approval of his physican as the patient has cardiac health condition and utilizing his support symptom. The OPT therapist worked with the patient on coping strategies to assist in management of his mood and anxiety.The OPT therapist will continue treatment work with the patient in his  next scheduled session   Plan: Return again in 2/3 weeks.   Diagnosis:      Axis I: PTSD/Panic Disorder/ Depression  Axis II: No diagnosis       Collaboration of Care: No additional collaboration of care for this session.   Patient/Guardian was advised Release of Information must be obtained prior to any record release in order to collaborate their care with an outside provider. Patient/Guardian was advised if they have not already done so to contact the registration department to sign all necessary forms in order for Richard [name] to release information regarding their care.    Consent: Patient/Guardian gives verbal consent for treatment and assignment of benefits for services provided during this visit. Patient/Guardian expressed understanding and agreed to proceed.    I  discussed the assessment and treatment plan with the patient. The patient was provided an opportunity to ask questions and all were answered. The patient agreed with the plan and demonstrated an understanding of the instructions.    The patient was advised to call back or seek an in-person evaluation if the symptoms worsen or if the condition fails to improve as anticipated.   I provided 55 minutes of non-face-to-face time during this encounter.   [name], LCSW   03/06/2022

## 2022-03-14 ENCOUNTER — Ambulatory Visit (INDEPENDENT_AMBULATORY_CARE_PROVIDER_SITE_OTHER): Payer: Self-pay | Admitting: Internal Medicine

## 2022-03-14 ENCOUNTER — Encounter: Payer: Self-pay | Admitting: Internal Medicine

## 2022-03-14 DIAGNOSIS — R0609 Other forms of dyspnea: Secondary | ICD-10-CM

## 2022-03-14 DIAGNOSIS — F17210 Nicotine dependence, cigarettes, uncomplicated: Secondary | ICD-10-CM

## 2022-03-14 DIAGNOSIS — R058 Other specified cough: Secondary | ICD-10-CM | POA: Insufficient documentation

## 2022-03-14 MED ORDER — BREZTRI AEROSPHERE 160-9-4.8 MCG/ACT IN AERO: 2.0000 | INHALATION_SPRAY | Freq: Two times a day (BID) | RESPIRATORY_TRACT | 0 refills | Status: DC

## 2022-03-14 NOTE — Assessment & Plan Note (Signed)
Active smoker Spirometry 05/05/15   FEV1 3.47 (85%)  Ratio 74  - 11/17/2015   try symb 80 2bid and stop advair  - 03/14/2022   Walked on RA  x  3  lap(s) =  approx 450  ft  @ fast pace, stopped due to end of study with lowest 02 sats 97% min sob   - 03/14/2022  After extensive coaching inhaler device,  effectiveness =    80% HFA so try off advair dpi  And on symbicort 80 or dulera 100 2bid (breztri sample given for teaching purposes   Upper airway cough syndrome (previously labeled PNDS),  is so named because it's frequently impossible to sort out how much is  CR/sinusitis with freq throat clearing (which can be related to primary GERD)   vs  causing  secondary (" extra esophageal")  GERD from wide swings in gastric pressure that occur with throat clearing, often  promoting self use of mint and menthol lozenges that reduce the lower esophageal sphincter tone and exacerbate the problem further in a cyclical fashion.   These are the same pts (now being labeled as having "irritable larynx syndrome" by some cough centers) who not infrequently have a history of having failed to tolerate ace inhibitors,  dry powder inhalers or biphosphonates or report having atypical/extraesophageal reflux symptoms that don't respond to standard doses of PPI  and are easily confused as having aecopd or asthma flares by even experienced allergists/ pulmonologists (myself included).   >>> max rx for gerd and RX AB with hfa then regroup in 6 weeks

## 2022-03-14 NOTE — Progress Notes (Signed)
Subjective:    Patient ID: Richard [name], male    DOB: 1965/09/20,    MRN: [id number]  HPI  59 yowm active smoker with onset of doe around 2011 much worse x 2015-16 to point where has trouble to walking to mailbox x [place]f [place] to [place]  so referred to pulmonary clinic 11/17/2015 by Dr MDarleene Cleaver  [facility]  On advair 250 bid  Chief Complaint  Patient presents with   pulmonary consult    pt ref by dr. mDarleene Cleaverfor SOB. dry cough occ prod, wheezing mainly @ night, occ cp.   indolent onset doe x 6 years worse with certain smells and some better on advair/ventolin as long as avoids exertion but  wheezing every night x one year disturbs sleep On nexium bid but not ac Rec Plan A = Automatic = Symbicort 80 Take 2 puffs first thing in am and then another 2 puffs about 12 hours later.  Plan B = Backup Only use your albuterol(ventolin)  as a rescue medication  Stop lisinopril and start valsartan 80 mg one daily in its place   nexium Take 30- 60 min before your first and last meals of the day  GERD diet  Please schedule a follow up office visit in 4 weeks, sooner if needed > did not return as rec   03/14/2022  Re-establish ov/[place] office/Korry Dalgleish re: AB maint on saba prn   Chief Complaint  Patient presents with   Consult    Consult for pulm emphysema. Lung pain and SOB   Dyspnea:  several aisles at [employer] variable speeds then gives out  Cough: varies during the day, not in AM/ just mucoid / powdered inhalers make it worse  Sleeping: bed has 2.5 inches or bad gerd  SABA use: up to 3 x days , never prechallenges 02: checks levels 20 min p arrives  Covid status: no vaccines, never infected  Lung cancer screening: had ct 09/2021   No obvious day to day or daytime variability or assoc excess/ purulent sputum or mucus plugs or hemoptysis or cp or chest tightness, subjective wheeze or overt sinus or hb symptoms.   Sleeping  without nocturnal  or early am  exacerbation  of respiratory  c/o's or need for noct saba. Also denies any obvious fluctuation of symptoms with weather or environmental changes or other aggravating or alleviating factors except as outlined above   No unusual exposure hx or h/o childhood pna/ asthma or knowledge of premature birth.  Current Allergies, Complete Past Medical History, Past Surgical History, Family History, and Social History were reviewed in [facility].  ROS  The following are not active complaints unless bolded Hoarseness, sore throat, dysphagia, dental problems, itching, sneezing,  nasal congestion or discharge of excess mucus or purulent secretions, ear ache,   fever, chills, sweats, unintended wt loss or wt gain, classically pleuritic or exertional cp,  orthopnea pnd or arm/hand swelling  or leg swelling, presyncope, palpitations, abdominal pain, anorexia, nausea, vomiting, diarrhea  or change in bowel habits or change in bladder habits, change in stools or change in urine, dysuria, hematuria,  rash, arthralgias, visual complaints, headache, numbness, weakness or ataxia or problems with walking or coordination,  change in mood or  memory.        Current Meds  Medication Sig   albuterol (VENTOLIN HFA) 108 (90 Base) MCG/ACT inhaler INHALE 2 PUFFS BY MOUTH EVERY 6 HOURS AS NEEDED FOR COUGHING,  WHEEZING, OR SHORTNESS OF BREATH   ALPRAZolam (XANAX) 0.5 MG tablet Take one twice a day and two at bedtime   amLODipine (NORVASC) 10 MG tablet TAKE 1 Tablet BY MOUTH ONCE DAILY   dronedarone (MULTAQ) 400 MG tablet Take 1 tablet (400 mg total) by mouth 2 (two) times daily with a meal.   esomeprazole (NEXIUM) 40 MG capsule Take 1 capsule (40 mg total) by mouth 2 (two) times daily before a meal.   famotidine (PEPCID) 20 MG tablet Take 1 tablet (20 mg total) by mouth 2 (two) times daily. (Patient taking differently: Take 20 mg by mouth 3 (three) times daily.)   metoprolol succinate (TOPROL XL) 25 MG  24 hr tablet Take 0.5 tablets (12.5 mg total) by mouth daily.   nitroGLYCERIN (NITROSTAT) 0.4 MG SL tablet Place 1 tablet (0.4 mg total) under the tongue every 5 (five) minutes x 3 doses as needed for chest pain (if no relief after 3rd dose, proceed to the ED for an evaluation).   simvastatin (ZOCOR) 20 MG tablet Take 1 tablet (20 mg total) by mouth daily.   vitamin B-12 (CYANOCOBALAMIN) 50 MCG tablet Take 50 mcg by mouth daily.   VITAMIN D PO Take 1 tablet by mouth daily.   XARELTO 20 MG TABS tablet TAKE 1 Tablet BY MOUTH ONCE EVERY DAY WITH SUPPER               Objective:   Physical Exam  amb wm nad  03/14/2022        175  11/17/15 207 lb 9.6 oz (94.167 kg)  10/14/15 205 lb 6.4 oz (93.169 kg)  10/07/15 204 lb (92.534 kg)     Vital signs reviewed  03/14/2022  - Note at rest 02 sats  97% on RA   General appearance:    amb anxious wm nad   HEENT : Oropharynx clear      Nasal turbinates nl    NECK :  without  apparent JVD/ palpable Nodes/TM    LUNGS: no acc muscle use,  Nl contour chest which is clear to A and P bilaterally without cough on insp or exp maneuvers   CV:  RRR  no s3 or murmur or increase in P2, and no edema   ABD:  soft and nontender with nl inspiratory excursion in the supine position. No bruits or organomegaly appreciated   MS:  Nl gait/ ext warm without deformities Or obvious joint restrictions  calf tenderness, cyanosis or clubbing    SKIN: warm and dry without lesions    NEURO:  alert, approp, nl sensorium with  no motor or cerebellar deficits apparent.           I personally reviewed images and agree with radiology impression as follows:   Chest CT w/o contrast   09/28/21  1. In the LEFT lower lobe, there are multiple adjacent pulmonary nodules which are new since January 2023. These are likely infectious or inflammatory in etiology. Given underlying mild emphysema, Non-contrast chest CT can be considered in 12 months.   2. Three-vessel  coronary artery atherosclerotic calcifications. 3. Given underlying history of smoking and underlying mild emphysema, recommend evaluation for candidacy for annual lung cancer screening CT.     Assessment & Plan:

## 2022-03-14 NOTE — Assessment & Plan Note (Addendum)
Active smoker Spirometry 05/05/15   FEV1 3.47 (85%)  Ratio 74  - 11/17/2015   try symb 80 2bid and stop advair  - 03/14/2022   Walked on RA  x  3  lap(s) =  approx 450  ft  @ fast pace, stopped due to end of study with lowest 02 sats 97% min sob     >>>   03/14/2022  After extensive coaching inhaler device,  effectiveness =    80% HFA so try off advair dpi  And on symbicort 80 or dulera 100 2bid (breztri sample given for teaching purposes  F/u can be in 6 weeks, sooner prn

## 2022-03-14 NOTE — Addendum Note (Signed)
Addended by: Fritzi Mandes D on: 03/14/2022 09:28 AM   Modules accepted: Orders

## 2022-03-14 NOTE — Patient Instructions (Addendum)
Nexium 40 mg Take 30- 60 min before your first and last meals of the day   GERD (REFLUX)  is an extremely common cause of respiratory symptoms just like yours , many times with no obvious heartburn at all.    It can be treated with medication, but also with lifestyle changes including elevation of the head of your bed (ideally with 6 -8inch blocks under the headboard of your bed),  Smoking cessation, avoidance of late meals, excessive alcohol, and avoid fatty foods, chocolate, peppermint, colas, red wine, and acidic juices such as orange juice.  NO MINT OR MENTHOL PRODUCTS SO NO COUGH DROPS  USE SUGARLESS CANDY INSTEAD (Jolley ranchers or Stover's or Life Savers) or even ice chips will also do - the key is to swallow to prevent all throat clearing. NO OIL BASED VITAMINS - use powdered substitutes.  Avoid fish oil when coughing.   Breztri one puff every 12 hours - if helps can try dulera or symbiocrt thru patient assistance- call once you see if this helps and if they cover any of the above.    Only use your albuterol as a rescue medication to be used if you can't catch your breath by resting or doing a relaxed purse lip breathing pattern.  - The less you use it, the better it will work when you need it. - Ok to use up to 2 puffs  every 4 hours if you must but call for immediate appointment if use goes up over your usual need - Don't leave home without it !!  (think of it like starter fluid or spare tire for your car)   Ok to try albuterol 15 min before an activity (on alternating days)  that you know would usually make you short of breath and see if it makes any difference and if makes none then don't take albuterol after activity unless you can't catch your breath as this means it's the resting that helps, not the albuterol.   Please schedule a follow up office visit in 6 weeks, call sooner if needed

## 2022-03-14 NOTE — Assessment & Plan Note (Addendum)
Low-dose CT lung cancer screening is recommended for patients who are 34-57 years of age with a 20+ pack-year history of smoking and who are currently smoking or quit <=15 years ago. No coughing up blood  No unintentional weight loss of > 15 pounds in the last 6 months - pt is eligible for scanning yearly until 15 y p quits > defer to PCP  Counseled re importance of smoking cessation but did not meet time criteria for separate billing    Each maintenance medication was reviewed in detail including emphasizing most importantly the difference between maintenance and prns and under what circumstances the prns are to be triggered using an action plan format where appropriate.  Total time for H and P, chart review, counseling, reviewing hfa device(s) , directly observing portions of ambulatory 02 saturation study/ and generating customized AVS unique to this office visit / same day charting > 30 min pt not seen in > 5y

## 2022-03-21 ENCOUNTER — Telehealth: Payer: Self-pay | Admitting: Internal Medicine

## 2022-03-21 NOTE — Telephone Encounter (Signed)
Spoke with [name] and she states that while patient was using Breztri inhaler, he was coughing more and coughing up sputum. He also had headaches while using inhaler. [name] states he has not used the inhaler for the past two days and since then his headaches have improved and his cough has gotten better. The patient would like to use a different inhaler besides breztri. Would like to try an inhaler that we have samples and pt assistance for since he does not have insurance.  Please advise

## 2022-03-21 NOTE — Telephone Encounter (Signed)
All the ones like it are not available as samples so rec just use albuterol prn until return for f/u and we might consider once of the other types (but they will need training to use properly and we can do this at next ov or move it up if doing worse off breztr and just on the albuterol  prn

## 2022-03-21 NOTE — Telephone Encounter (Signed)
Called and spoke with [place] and went over recs with her. She voiced understanding and nothing further is needed at this time.

## 2022-03-23 NOTE — Telephone Encounter (Signed)
Opened in Error.

## 2022-03-30 ENCOUNTER — Telehealth: Payer: Self-pay | Admitting: Cardiology

## 2022-03-30 NOTE — Telephone Encounter (Signed)
   Pre-operative Risk Assessment    Patient Name: Richard [name]  DOB: 12-17-1965 MRN: [id number]     Request for Surgical Clearance    Procedure:  Dental Extraction - Amount of Teeth to be Pulled:  4 (11, 13, 14 and 15 done)  Date of Surgery:  Clearance 04/03/22                                 Surgeon:  Dr. Erik Obey Surgeon's Group or Practice Name:  [facility] Phone number:  [phone] Fax number:  [phone]   Type of Clearance Requested:   - Medical    Type of Anesthesia:   TBD   Additional requests/questions:      SignedMilbert Coulter   03/30/2022, 1:40 PM

## 2022-03-31 NOTE — Telephone Encounter (Signed)
Patient's wife is following up regarding clearance, requesting updates ASAP. She states his procedure is scheduled for 9/18.

## 2022-03-31 NOTE — Telephone Encounter (Signed)
Clearance to hold Xarelto not indicated, but will route to Pharmacy as this is likely going to be requested for 4 extractions

## 2022-04-03 ENCOUNTER — Encounter: Payer: Self-pay | Admitting: *Deleted

## 2022-04-04 ENCOUNTER — Telehealth: Payer: Self-pay | Admitting: *Deleted

## 2022-04-04 NOTE — Telephone Encounter (Signed)
Pt agreeable to tele pre op appt 04/24/22 @ 9:20. Med rec and consent are done.

## 2022-04-04 NOTE — Telephone Encounter (Signed)
Patient with diagnosis of afib on Xarelto for anticoagulation.    Procedure:  Dental Extraction - Amount of Teeth to be Pulled:  4 (11, 13, 14 and 15 done) Date of procedure: 04/03/22   CHA2DS2-VASc Score = 5   This indicates a 7.2% annual risk of stroke. The patient's score is based upon: CHF History: 0 HTN History: 1 Diabetes History: 1 Stroke History: 2 Vascular Disease History: 1 Age Score: 0 Gender Score: 0      CrCl 90 ml/min  Patient does NOT require pre-op antibiotics for dental procedure.  Per office protocol, patient can hold Xarelto for 1 day prior to procedure.    **This guidance is not considered finalized until pre-operative APP has relayed final recommendations.**

## 2022-04-04 NOTE — Telephone Encounter (Signed)
Pt agreeable to tele pre op appt 04/24/22 @ 9:20. Med rec and consent are done.     Patient Consent for Virtual Visit        Richard [name] has provided verbal consent on 04/04/2022 for a virtual visit (video or telephone).   CONSENT FOR VIRTUAL VISIT FOR:  Richard [name]  By participating in this virtual visit I agree to the following:  I hereby voluntarily request, consent and authorize [facility] and its employed or contracted physicians, physician assistants, nurse practitioners or other licensed health care professionals (the Practitioner), to provide me with telemedicine health care services (the "Services") as deemed necessary by the treating Practitioner. I acknowledge and consent to receive the Services by the Practitioner via telemedicine. I understand that the telemedicine visit will involve communicating with the Practitioner through live audiovisual communication technology and the disclosure of certain medical information by electronic transmission. I acknowledge that I have been given the opportunity to request an in-person assessment or other available alternative prior to the telemedicine visit and am voluntarily participating in the telemedicine visit.  I understand that I have the right to withhold or withdraw my consent to the use of telemedicine in the course of my care at any time, without affecting my right to future care or treatment, and that the Practitioner or I may terminate the telemedicine visit at any time. I understand that I have the right to inspect all information obtained and/or recorded in the course of the telemedicine visit and may receive copies of available information for a reasonable fee.  I understand that some of the potential risks of receiving the Services via telemedicine include:  Delay or interruption in medical evaluation due to technological equipment failure or disruption; Information transmitted may not be sufficient (e.g. poor  resolution of images) to allow for appropriate medical decision making by the Practitioner; and/or  In rare instances, security protocols could fail, causing a breach of personal health information.  Furthermore, I acknowledge that it is my responsibility to provide information about my medical history, conditions and care that is complete and accurate to the best of my ability. I acknowledge that Practitioner's advice, recommendations, and/or decision may be based on factors not within their control, such as incomplete or inaccurate data provided by me or distortions of diagnostic images or specimens that may result from electronic transmissions. I understand that the practice of medicine is not an exact science and that Practitioner makes no warranties or guarantees regarding treatment outcomes. I acknowledge that a copy of this consent can be made available to me via my patient portal ([facility]), or I can request a printed copy by calling the office of [facility].    I understand that my insurance will be billed for this visit.   I have read or had this consent read to me. I understand the contents of this consent, which adequately explains the benefits and risks of the Services being provided via telemedicine.  I have been provided ample opportunity to ask questions regarding this consent and the Services and have had my questions answered to my satisfaction. I give my informed consent for the services to be provided through the use of telemedicine in my medical care

## 2022-04-10 ENCOUNTER — Telehealth: Payer: Self-pay | Admitting: Internal Medicine

## 2022-04-10 ENCOUNTER — Telehealth (INDEPENDENT_AMBULATORY_CARE_PROVIDER_SITE_OTHER): Payer: Self-pay | Admitting: Clinical

## 2022-04-10 DIAGNOSIS — F4001 Agoraphobia with panic disorder: Secondary | ICD-10-CM

## 2022-04-10 DIAGNOSIS — F331 Major depressive disorder, recurrent, moderate: Secondary | ICD-10-CM

## 2022-04-10 DIAGNOSIS — F4310 Post-traumatic stress disorder, unspecified: Secondary | ICD-10-CM

## 2022-04-10 DIAGNOSIS — F419 Anxiety disorder, unspecified: Secondary | ICD-10-CM

## 2022-04-10 NOTE — Telephone Encounter (Signed)
Pt c/o medication issue:  1. Name of Medication:   dronedarone (MULTAQ) 400 MG tablet    2. How are you currently taking this medication (dosage and times per day)?  Take 1 tablet (400 mg total) by mouth 2 (two) times daily with a meal. 3. Are you having a reaction (difficulty breathing--STAT)? No  4. What is your medication issue? Spouse states that pt just opened his last bottle of medication. They were told to call when he is on the last bottle so that office can reorder. Please advise

## 2022-04-10 NOTE — Telephone Encounter (Signed)
Spoke to pt and advised that [employer], LPN was not in office today (9/25), but will be back on 9/26.

## 2022-04-10 NOTE — Progress Notes (Signed)
Virtual Visit via Telephone Note   I connected with [name] on 04/10/22 at 3:00 PM EST by telephone and verified that I am speaking with the correct person using two identifiers.   Location: Patient: Home Provider: Office   I discussed the limitations, risks, security and privacy concerns of performing an evaluation and management service by telephone and the availability of in person appointments. I also discussed with the patient that there may be a patient responsible charge related to this service. The patient expressed understanding and agreed to proceed.   THERAPIST PROGRESS NOTE     Session Time: 3:00 PM-3:55 PM   Participation Level: Active   Behavioral Response: Casual and Alert,Anxious   Type of Therapy: Individual Therapy   Treatment Goals addressed: Mood and Anxiety   Interventions: CBT   Summary: Richard [name] is a 56 y.o. male who presents with panic disorder/depression with anxiety/and PTSD . The OPT therapist worked with the patient for his OPT treatment. The OPT therapist utilized Motivational Interviewing to assist in creating therapeutic repore. The patient in the session was engaged and work in collaboration giving feedback about his triggers and symptoms over the past few weeks The patient spoke about his work to be more active with the cooling Fall weather.The OPT therapist utilized Cognitive Behavioral Therapy through cognitive restructuring as well as worked with the patient on coping strategies to assist in management of symptoms as well as reviewed sleep, eating habits, and general health. The patient continues to work on acceptance of limited mobility based on health conditions.The OPT therapist overviewed upcoming health appointments as listed in the patients MyChart.   Suicidal/Homicidal: Nowithout intent/plan   Therapist Response:The OPT therapist worked with the patient for the patients scheduled session. The patient was engaged in his session and  gave feedback in relation to triggers, symptoms, and behavior responses over the past few weeks. The OPT therapist worked with the patient utilizing an in session Cognitive Behavioral Therapy exercise. The patient was responsive in the session and verbalized, " I still struggle with not being able to get out here and work and make money, so In my mind its telling me to do more but my body wont let me do it".The OPT therapist worked with the patient in this session providing support and reviewing with the patient grieving and placing emphasis on the patient working to remain active while using his judgement and being active at low temperature opportunities with the approval of his physican as the patient has cardiac health condition and utilizing his support symptom. The OPT therapist worked with the patient on coping strategies to assist in management of his mood and anxiety.The OPT therapist will continue treatment work with the patient in his  next scheduled session   Plan: Return again in 2/3 weeks.   Diagnosis:      Axis I: PTSD/Panic Disorder/ Depression  Axis II: No diagnosis       Collaboration of Care: No additional collaboration of care for this session.   Patient/Guardian was advised Release of Information must be obtained prior to any record release in order to collaborate their care with an outside provider. Patient/Guardian was advised if they have not already done so to contact the registration department to sign all necessary forms in order for [name] to release information regarding their care.    Consent: Patient/Guardian gives verbal consent for treatment and assignment of benefits for services provided during this visit. Patient/Guardian expressed understanding and agreed to proceed.  I discussed the assessment and treatment plan with the patient. The patient was provided an opportunity to ask questions and all were answered. The patient agreed with the plan and demonstrated an  understanding of the instructions.   The patient was advised to call back or seek an in-person evaluation if the symptoms worsen or if the condition fails to improve as anticipated.   I provided 55 minutes of non-face-to-face time during this encounter.   [name], LCSW   04/10/2022

## 2022-04-11 NOTE — Telephone Encounter (Signed)
Spoke with wife who states that she will come by on Thurs. And complete application.

## 2022-04-11 NOTE — Telephone Encounter (Signed)
Spoke with [employer] and pt's medication is preparing to ship. Pt needs to complete new application for [employer] because his enrollment runs out in October.

## 2022-04-17 ENCOUNTER — Telehealth: Payer: Self-pay | Admitting: Cardiology

## 2022-04-17 NOTE — Telephone Encounter (Signed)
This is a duplicate request.  Please see request dated 03/30/2022 entitled preop.

## 2022-04-17 NOTE — Telephone Encounter (Signed)
   Pre-operative Risk Assessment    Patient Name: Richard [name]  DOB: 08-12-1965 MRN: [id number]     Request for Surgical Clearance    Procedure:  Dental Extraction - Amount of Teeth to be Pulled:  4  Date of Surgery:  Clearance TBD                                 Surgeon:  Erik Obey  Surgeon's Group or Practice Name:  [facility] Department  Phone number:  [phone]  Fax number:  [phone]   Type of Clearance Requested:   - Medical    5. What type of anesthesia will be used?  Press F2 and select the anesthesia to be used for the procedure.  :1}  Type of Anesthesia:  Local    Additional requests/questions:    Signed, Tildon Husky   04/17/2022, 3:59 PM

## 2022-04-19 ENCOUNTER — Telehealth: Payer: Self-pay | Admitting: *Deleted

## 2022-04-19 NOTE — Telephone Encounter (Signed)
Pt approved for the [employer] [facility]) patient assistance. Pt notified and thankful for the call.

## 2022-04-21 NOTE — Progress Notes (Signed)
Virtual Visit via Telephone Note   Because of Manu L Tieu's co-morbid illnesses, he is at least at moderate risk for complications without adequate follow up.  This format is felt to be most appropriate for this patient at this time.  The patient did not have access to video technology/had technical difficulties with video requiring transitioning to audio format only (telephone).  All issues noted in this document were discussed and addressed.  No physical exam could be performed with this format.  Please refer to the patient's chart for his consent to telehealth for [facility].  Evaluation Performed:  Preoperative cardiovascular risk assessment _____________   Date:  04/21/2022   Patient ID:  LENOX BINK, DOB March 14, 1966, MRN [id number] Patient Location:  Home Provider location:   Office  Primary Care Provider:  [name], PA-C Primary Cardiologist:  Rozann Lesches, MD  Chief Complaint / Patient Profile   56 y.o. y/o male with a h/o PAF on chronic anticoagulation and AAD, tobacco abuse, HTN, COPD, and  cardiac catheterization 2017 with 65% dist Cx lesion, 20% stenosis mid to dis LAD and prox to dist RCA who is pending dental extraction x 4 and presents today for telephonic preoperative cardiovascular risk assessment.  Past Medical History    Past Medical History:  Diagnosis Date   Arthritis    Asthma    CAD (coronary artery disease)    a. Cardiac cath 07/2015 showed 65% distal Cx, 20% mid-distal LAD, 20% prox-distal RCA, EF 60%, EDP 35mHg.   Colitis 1990   COPD (chronic obstructive pulmonary disease) ([place]    Depression    Essential hypertension    Gastric ulcer 2003; 2012   2003: + esophagitis; negative H.pylori serology  2012: [name] mild gastritis, Bravo PH probe placement, negative H.pylori   GERD (gastroesophageal reflux disease)    Hepatic steatosis    History of hiatal hernia    Hyperlipemia    Overweight    Panic attacks    Paroxysmal  atrial fibrillation ([place]    Stroke ([place]    TIA (transient ischemic attack)    Type II diabetes mellitus ([place]    Past Surgical History:  Procedure Laterality Date   BALLOON DILATION N/A 07/25/2021   Procedure: BALLOON DILATION;  Surgeon: CEloise Harman DO;  [facility];  Service: Endoscopy;  Laterality: N/A;   BIOPSY  07/25/2021   Procedure: BIOPSY;  Surgeon: CEloise Harman DO;  Location: [facility];  Service: Endoscopy;;   [place]  05/03/2011   SION:GEXBgastritis/normal esophagus and duodenum   CARDIAC CATHETERIZATION  1990s X 1; 2005; 08/12/2015   CARDIAC CATHETERIZATION N/A 08/12/2015   Procedure: Left Heart Cath and Coronary Angiography;  Surgeon: HBelva Crome MD; LAD 20%, CFX 65%, RCA 20%, EF 60%    COLONOSCOPY  1990   COLONOSCOPY WITH PROPOFOL N/A 11/21/2016   [name] non-thrombosed external hemorrhoids, one 6 mm polyp (polypoid lesion), internal hemorrhoids. TI Normal. 10 years screening   ESOPHAGOGASTRODUODENOSCOPY  05/03/2011   SMWU:XLKGgastritis   ESOPHAGOGASTRODUODENOSCOPY (EGD) WITH PROPOFOL N/A 07/25/2021   Procedure: ESOPHAGOGASTRODUODENOSCOPY (EGD) WITH PROPOFOL;  Surgeon: CEloise Harman DO;  Location: [facility];  Service: Endoscopy;  Laterality: N/A;  1:30pm   NECK MASS EXCISION Right    "done in dr's office; behind right ear/side of ncek"   POLYPECTOMY  11/21/2016   Procedure: POLYPECTOMY;  Surgeon: FDanie Binder MD;  Location: [facility];  Service: Endoscopy;;  descending  colon polyp   SHOULDER ARTHROSCOPY W/ ROTATOR CUFF REPAIR Right 2006   acromioclavicular joint arthrosis    Allergies  Allergies  Allergen Reactions   Dexilant [Dexlansoprazole] Anaphylaxis   Mushroom Ext Cmplx(Shiitake-Reishi-Mait) Anaphylaxis    Rapid heart rate.   Penicillins Anaphylaxis    Has patient had a PCN reaction causing immediate rash, facial/tongue/throat swelling, SOB or lightheadedness with hypotension: Yes Has patient had a PCN reaction  causing severe rash involving mucus membranes or skin necrosis: No Has patient had a PCN reaction that required hospitalization Yes Has patient had a PCN reaction occurring within the last 10 years: No If all of the above answers are "NO", then may proceed with Cephalosporin use.    Doxycycline Nausea And Vomiting         History of Present Illness    Gottlieb Zuercher Krotz is a 56 y.o. male who presents via audio/video conferencing for a telehealth visit today.  Pt was last seen in cardiology clinic on 11/03/21 by [name].  At that time KAZ AULD was having muscle aches on simvastatin and was switched to rosuvastatin. He was prescribed nicotine patches to help him stop smoking. The patient is now pending procedure as outlined above. Since his last visit, he reports he has daily chest pain that sometimes occurs after eating, seems to be worse when he is at home. He is concerned about exposure to black mold. Has chronic shortness of breath and discomfort when lying down. Reports chest pain sometimes gets worse when lying down. Reports his legs hurt but his blood sugar has been well-controlled. He denies lower extremity edema, melena, hematuria, hemoptysis, diaphoresis, weakness, presyncope, syncope, orthopnea, and PND. Also reports frequent headaches. He is able to achieve > 4 METS activity, although at times he is limited by pain. Pain is not typical for angina as it occurs more often after eating or when lying down. No significant change in symptoms since last nuclear stress test 12/2020 which was a low risk study.   Home Medications    Prior to Admission medications   Medication Sig Start Date End Date Taking? Authorizing Provider  albuterol (VENTOLIN HFA) 108 (90 Base) MCG/ACT inhaler INHALE 2 PUFFS BY MOUTH EVERY 6 HOURS AS NEEDED FOR COUGHING, WHEEZING, OR SHORTNESS OF BREATH 03/06/22   [name], PA-C  ALPRAZolam Duanne Moron) 0.5 MG tablet Take one twice a day and two at bedtime 02/21/22    Cloria Spring, MD  amLODipine (NORVASC) 10 MG tablet TAKE 1 Tablet BY MOUTH ONCE DAILY 12/15/21   Satira Sark, MD  Budeson-Glycopyrrol-Formoterol (BREZTRI AEROSPHERE) 160-9-4.8 MCG/ACT AERO Inhale 2 puffs into the lungs in the morning and at bedtime. Patient not taking: Reported on 04/04/2022 03/14/22   Tanda Rockers, MD  dronedarone (MULTAQ) 400 MG tablet Take 1 tablet (400 mg total) by mouth 2 (two) times daily with a meal. 05/10/21   Evans Lance, MD  esomeprazole (NEXIUM) 40 MG capsule Take 1 capsule (40 mg total) by mouth 2 (two) times daily before a meal. 11/17/21 11/17/22  Eloise Harman, DO  famotidine (PEPCID) 20 MG tablet Take 1 tablet (20 mg total) by mouth 2 (two) times daily. Patient taking differently: Take 20 mg by mouth 3 (three) times daily. 08/22/21   Mahala Menghini, PA-C  metoprolol succinate (TOPROL XL) 25 MG 24 hr tablet Take 0.5 tablets (12.5 mg total) by mouth daily. 09/30/21   Evans Lance, MD  nitroGLYCERIN (NITROSTAT) 0.4 MG SL tablet Place  1 tablet (0.4 mg total) under the tongue every 5 (five) minutes x 3 doses as needed for chest pain (if no relief after 3rd dose, proceed to the ED for an evaluation). Patient not taking: Reported on 04/04/2022 04/20/21   Thompson Grayer, MD  simvastatin (ZOCOR) 20 MG tablet Take 1 tablet (20 mg total) by mouth daily. 12/08/21   Evans Lance, MD  vitamin B-12 (CYANOCOBALAMIN) 50 MCG tablet Take 50 mcg by mouth daily.    [provider]  VITAMIN D PO Take 1 tablet by mouth daily.    [provider]  XARELTO 20 MG TABS tablet TAKE 1 Tablet BY MOUTH ONCE EVERY DAY WITH SUPPER 12/15/21   Satira Sark, MD    Physical Exam    Vital Signs:  Shemuel L Moctezuma does not have vital signs available for review today.  Given telephonic nature of communication, physical exam is limited. AAOx3. NAD. Normal affect.  Speech and respirations are unlabored.  Accessory Clinical Findings    None  Assessment & Plan    1.   Preoperative Cardiovascular Risk Assessment:As noted above, patient has chronic symptoms of SOB, chest pain, and fatigue that are difficult to determine cardiac component. No significant change since last stress test 12/2020. The patient was advised that if he develops new symptoms prior to surgery to contact our office to arrange for a follow-up visit, and he verbalized understanding. According to the Revised Cardiac Risk Index (RCRI), his Perioperative Risk of Major Cardiac Event is (%): 6.6. His Functional Capacity in METs is: 4.95 according to the Duke Activity Status Index (DASI).I spoke with Otila Kluver at the [facility] and verified that patient will not go under anesthesia. Dental extractions will be completed with local anesthesia. Therefore, based upon AHA/ACC guidelines, he may proceed with low risk procedure without further cardiac testing.  Per office protocol, patient can hold Xarelto for 1 day prior to procedure.    A copy of this note will be routed to requesting surgeon.  Time:   Today, I have spent 10 minutes with the patient with telehealth technology discussing medical history, symptoms, and management plan.     Emmaline Life, NP-C  04/24/2022, 9:17 AM [address], [address] Office [phone] Fax [phone]

## 2022-04-24 ENCOUNTER — Ambulatory Visit: Payer: Self-pay | Attending: Internal Medicine | Admitting: Nurse Practitioner

## 2022-04-24 ENCOUNTER — Encounter: Payer: Self-pay | Admitting: Nurse Practitioner

## 2022-04-24 DIAGNOSIS — Z01810 Encounter for preprocedural cardiovascular examination: Secondary | ICD-10-CM

## 2022-04-25 ENCOUNTER — Encounter: Payer: Self-pay | Admitting: Internal Medicine

## 2022-04-25 ENCOUNTER — Ambulatory Visit (INDEPENDENT_AMBULATORY_CARE_PROVIDER_SITE_OTHER): Payer: Self-pay | Admitting: Internal Medicine

## 2022-04-25 DIAGNOSIS — R0609 Other forms of dyspnea: Secondary | ICD-10-CM

## 2022-04-25 DIAGNOSIS — F17210 Nicotine dependence, cigarettes, uncomplicated: Secondary | ICD-10-CM

## 2022-04-25 NOTE — Patient Instructions (Addendum)
Plan A = Automatic = Always=    Stiolto 2 puffs each am daily   Work on inhaler technique:  relax and gently blow all the way out then take a nice smooth full deep breath back in, triggering the inhaler at same time you start breathing in.  Hold breath in for at least  5 seconds if you can.  Rinse and gargle with water when done.  If mouth or throat bother you at all,  try brushing teeth/gums/tongue with arm and hammer toothpaste/ make a slurry and gargle and spit out.   Plan B = Backup (to supplement plan A, not to replace it) Only use your albuterol inhaler as a rescue medication to be used if you can't catch your breath by resting or doing a relaxed purse lip breathing pattern.  - The less you use it, the better it will work when you need it. - Ok to use the inhaler up to 2 puffs  every 4 hours if you must but call for appointment if use goes up over your usual need - Don't leave home without it !!  (think of it like the spare tire for your car)      Ok to try albuterol 15 min before an activity (on alternating days)  that you know would usually make you short of breath and see if it makes any difference and if makes none then don't take albuterol after activity unless you can't catch your breath as this means it's the resting that helps, not the albuterol.  Get rid of the mold and cigarettes  You need a follow up CT chest in 09/2022 - defer to your PCP   Pulmonary follow up is a needed

## 2022-04-25 NOTE — Progress Notes (Unsigned)
Subjective:    Patient ID: Richard [name], male    DOB: 11/11/1965,    MRN: [id number]  HPI  70 yowm active smoker with onset of doe around 2011 much worse x 2015-16 to point where has trouble to walking to mailbox x 138f slt up[place] to [place]  so referred to pulmonary clinic 11/17/2015 by Dr MDarleene Cleaver   11/17/2015 [facility]/ Aowyn Roz[place]  On advair 250 bid  Chief Complaint  Patient presents with   pulmonary consult    pt ref by dr. mDarleene Cleaverfor SOB. dry cough occ prod, wheezing mainly @ night, occ cp.   indolent onset doe x 6 years worse with certain smells and some better on advair/ventolin as long as avoids exertion but  wheezing every night x one year disturbs sleep On nexium bid but not ac Rec Plan A = Automatic = Symbicort 80 Take 2 puffs first thing in am and then another 2 puffs about 12 hours later.  Plan B = Backup Only use your albuterol(ventolin)  as a rescue medication  Stop lisinopril and start valsartan 80 mg one daily in its place   nexium Take 30- 60 min before your first and last meals of the day  GERD diet  Please schedule a follow up office visit in 4 weeks, sooner if needed > did not return as rec   03/14/2022  Re-establish ov/[place] office/Jaxxson [place] re: AB maint on saba prn   Chief Complaint  Patient presents with   Consult    Consult for pulm emphysema. Lung pain and SOB   Dyspnea:  several [employer]isles at [employer] variable speeds then gives out  Cough: varies during the day, not in AM/ just mucoid / powdered inhalers make it worse  Sleeping: bed has 2.5 inches or bad gerd  SABA use: up to 3 x days , never prechallenges 02: checks levels 20 min p arrives  Covid status: no vaccines, never infected  Lung cancer screening: had ct 09/2021 Rec Nexium 40 mg Take 30- 60 min before your first and last meals of the day  GERD diet reviewed, bed blocks rec  Breztri one puff every 12 hours - if helps can try dulera or symbiocrt thru patient assistance Only use your  albuterol as a rescue medication  Ok to try albuterol 15 min before an activity (on alternating days)  that you know would usually make you short of breath      04/25/2022  f/u ov/[place] office/Quintan Saldivar re: doe  maint on nothing  / breztri made him cough Chief Complaint  Patient presents with   Follow-up    Breathing has not changed since last ov   Dyspnea:  [employer] walking /5 squats / able to cross a half parking   Cough: clear phlegy Sleeping: slt elevation  SABA use: none on day of, up to twice daily as needed but does not pre-challenge or rechallenge as rec  02: none  Covid status: never  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  x 5 years > filed for disability    No obvious day to day or daytime variability or assoc excess/ purulent sputum or mucus plugs or hemoptysis or cp or chest tightness, subjective wheeze or overt sinus or hb symptoms.   Sleeping  without nocturnal  or early am exacerbation  of respiratory  c/o's or need for noct saba. Also denies any obvious fluctuation of symptoms with weather or environmental changes or other aggravating or alleviating factors except as outlined above  No unusual exposure hx or h/o childhood pna/ asthma or knowledge of premature birth.  Current Allergies, Complete Past Medical History, Past Surgical History, Family History, and Social History were reviewed in [employer] record.  ROS  The following are not active complaints unless bolded Hoarseness, sore throat, dysphagia, dental problems, itching, sneezing,  nasal congestion or discharge of excess mucus or purulent secretions, ear ache,   fever, chills, sweats, unintended wt loss or wt gain, classically pleuritic or exertional cp,  orthopnea pnd or arm/hand swelling  or leg swelling, presyncope, palpitations, abdominal pain, anorexia, nausea, vomiting, diarrhea  or change in bowel habits or change in bladder habits, change in stools or change in urine, dysuria, hematuria,   rash, arthralgias, visual complaints, headache, numbness, weakness or ataxia or problems with walking or coordination,  change in mood or  memory.        Current Meds  Medication Sig   albuterol (VENTOLIN HFA) 108 (90 Base) MCG/ACT inhaler INHALE 2 PUFFS BY MOUTH EVERY 6 HOURS AS NEEDED FOR COUGHING, WHEEZING, OR SHORTNESS OF BREATH   ALPRAZolam (XANAX) 0.5 MG tablet Take one twice a day and two at bedtime   amLODipine (NORVASC) 10 MG tablet TAKE 1 Tablet BY MOUTH ONCE DAILY   Budeson-Glycopyrrol-Formoterol (BREZTRI AEROSPHERE) 160-9-4.8 MCG/ACT AERO Inhale 2 puffs into the lungs in the morning and at bedtime.   dronedarone (MULTAQ) 400 MG tablet Take 1 tablet (400 mg total) by mouth 2 (two) times daily with a meal.   esomeprazole (NEXIUM) 40 MG capsule Take 1 capsule (40 mg total) by mouth 2 (two) times daily before a meal.   famotidine (PEPCID) 20 MG tablet Take 1 tablet (20 mg total) by mouth 2 (two) times daily. (Patient taking differently: Take 20 mg by mouth 3 (three) times daily.)   metoprolol succinate (TOPROL XL) 25 MG 24 hr tablet Take 0.5 tablets (12.5 mg total) by mouth daily.   nitroGLYCERIN (NITROSTAT) 0.4 MG SL tablet Place 1 tablet (0.4 mg total) under the tongue every 5 (five) minutes x 3 doses as needed for chest pain (if no relief after 3rd dose, proceed to the ED for an evaluation).   simvastatin (ZOCOR) 20 MG tablet Take 1 tablet (20 mg total) by mouth daily.   vitamin B-12 (CYANOCOBALAMIN) 50 MCG tablet Take 50 mcg by mouth daily.   VITAMIN D PO Take 1 tablet by mouth daily.   XARELTO 20 MG TABS tablet TAKE 1 Tablet BY MOUTH ONCE EVERY DAY WITH SUPPER                  Objective:   Physical Exam    04/25/2022      173  03/14/2022        175  11/17/15 207 lb 9.6 oz (94.167 kg)  10/14/15 205 lb 6.4 oz (93.169 kg)  10/07/15 204 lb (92.534 kg)    Vital signs reviewed  04/25/2022  - Note at rest 02 sats  97% on RA   General appearance:    thin wm nad   HEENT :  Oropharynx  clear      Nasal turbinates nl    NECK :  without  apparent JVD/ palpable Nodes/TM    LUNGS: no acc muscle use,  Nl contour chest which is clear to A and P bilaterally without cough on insp or exp maneuvers   CV:  RRR  no s3 or murmur or increase in P2, and no edema   ABD:  soft and nontender with nl inspiratory excursion in the supine position. No bruits or organomegaly appreciated   MS:  Nl gait/ ext warm without deformities Or obvious joint restrictions  calf tenderness, cyanosis or clubbing    SKIN: warm and dry without lesions    NEURO:  alert, approp, nl sensorium with  no motor or cerebellar deficits apparent.        Assessment & Plan:

## 2022-04-26 ENCOUNTER — Encounter: Payer: Self-pay | Admitting: Internal Medicine

## 2022-04-26 NOTE — Assessment & Plan Note (Addendum)
Active smoker Spirometry 05/05/15   FEV1 3.47 (85%)  Ratio 74  - 11/17/2015   try symb 80 2bid and stop advair  - 03/14/2022   Walked on RA  x  3  lap(s) =  approx 450  ft  @ fast pace, stopped due to end of study with lowest 02 sats 97% min sob   - 03/14/2022  After extensive coaching inhaler device,  effectiveness =    80% HFA so try off advair dpi  And on symbicort 80 or dulera 100 2bid (breztri sample given for teaching purposes  - 04/25/2022  After extensive coaching inhaler device,  effectiveness =    90% with stiolto> trial x 2 weeks and if no improvement will need pfts before any further empirical trials but for declined due to cost          Each maintenance medication was reviewed in detail including emphasizing most importantly the difference between maintenance and prns and under what circumstances the prns are to be triggered using an action plan format where appropriate.  Total time for H and P, chart review, counseling, reviewing smi device(s) and generating customized AVS unique to this office visit / same day charting = 15 min

## 2022-04-26 NOTE — Assessment & Plan Note (Signed)
Counseled re importance of smoking cessation but did not meet time criteria for separate billing   °

## 2022-05-10 ENCOUNTER — Ambulatory Visit: Payer: Self-pay | Admitting: Physician Assistant

## 2022-05-10 ENCOUNTER — Telehealth: Payer: Self-pay | Admitting: Internal Medicine

## 2022-05-10 ENCOUNTER — Encounter: Payer: Self-pay | Admitting: Physician Assistant

## 2022-05-10 DIAGNOSIS — J069 Acute upper respiratory infection, unspecified: Secondary | ICD-10-CM

## 2022-05-10 DIAGNOSIS — F17200 Nicotine dependence, unspecified, uncomplicated: Secondary | ICD-10-CM

## 2022-05-10 NOTE — Progress Notes (Signed)
There were no vitals taken for this visit.   Subjective:    Patient ID: Richard [name], male    DOB: 1965/08/28, 56 y.o.   MRN: [id number]  HPI: Richard [name] is a 56 y.o. male presenting on 05/10/2022 for No chief complaint on file.   HPI  This is a telemedicine appointment for pt called wanting appointment due to sickness.  It is via Telephone as pt does not have access to video enabled device.    I connected with  Richard [name] on 05/10/22 by a video enabled telemedicine application and verified that I am speaking with the correct person using two identifiers.   I discussed the limitations of evaluation and management by telemedicine. The patient expressed understanding and agreed to proceed.  Pt is at home.  Provider is at office.     Pt says he is Not feeling well.  He says his Teeth inflamed and swelled.  He says he had pain in his lung pain, in his chest when he breaths deep; he says that was yesterday but none today as it has resolved.  He has had No emesis. He has had No diarrhea.  He says he has "kinda " constipation.  He says he has Rumbling noises in his abdomen.  He says he had a "Fever of 99.2" four  days ago.  He also says he was Coughing 3 or 4 days ago.  He says that is mostly better now.    He was suppose to go to dentist on Monday.  he was told not not to come-  Clinic in [place].  They told him not to go because of his cough.  He says the dentist called in rx for clindamycin liquid but they're having difficulty finding it.  He says he is Still smoking "lightly", 3-4 cig/day.  hE says he is on a New inhaler called Stiolto respimat given to him by Dr Melvyn Novas, the pulmonologist  He says dr wert told him nothing wrong with his lung   Pt checks his O2 at home and says it's running 96-97-98%        Relevant past medical, surgical, family and social history reviewed and updated as indicated. Interim medical history since our last visit reviewed. Allergies and  medications reviewed and updated.   Current Outpatient Medications:    albuterol (VENTOLIN HFA) 108 (90 Base) MCG/ACT inhaler, INHALE 2 PUFFS BY MOUTH EVERY 6 HOURS AS NEEDED FOR COUGHING, WHEEZING, OR SHORTNESS OF BREATH, Disp: 20.1 g, Rfl: 0   Tiotropium Bromide-Olodaterol (STIOLTO RESPIMAT) 2.5-2.5 MCG/ACT AERS, Inhale into the lungs., Disp: , Rfl:    ALPRAZolam (XANAX) 0.5 MG tablet, Take one twice a day and two at bedtime, Disp: 120 tablet, Rfl: 2   amLODipine (NORVASC) 10 MG tablet, TAKE 1 Tablet BY MOUTH ONCE DAILY, Disp: 90 tablet, Rfl: 1   Budeson-Glycopyrrol-Formoterol (BREZTRI AEROSPHERE) 160-9-4.8 MCG/ACT AERO, Inhale 2 puffs into the lungs in the morning and at bedtime. (Patient not taking: Reported on 05/10/2022), Disp: 10.7 g, Rfl: 0   dronedarone (MULTAQ) 400 MG tablet, Take 1 tablet (400 mg total) by mouth 2 (two) times daily with a meal., Disp: 60 tablet, Rfl: 11   esomeprazole (NEXIUM) 40 MG capsule, Take 1 capsule (40 mg total) by mouth 2 (two) times daily before a meal., Disp: 60 capsule, Rfl: 11   famotidine (PEPCID) 20 MG tablet, Take 1 tablet (20 mg total) by mouth 2 (two) times daily. (Patient taking differently: Take 20  mg by mouth 3 (three) times daily.), Disp: 180 tablet, Rfl: 3   metoprolol succinate (TOPROL XL) 25 MG 24 hr tablet, Take 0.5 tablets (12.5 mg total) by mouth daily., Disp: 45 tablet, Rfl: 3   nitroGLYCERIN (NITROSTAT) 0.4 MG SL tablet, Place 1 tablet (0.4 mg total) under the tongue every 5 (five) minutes x 3 doses as needed for chest pain (if no relief after 3rd dose, proceed to the ED for an evaluation)., Disp: 25 tablet, Rfl: 3   simvastatin (ZOCOR) 20 MG tablet, Take 1 tablet (20 mg total) by mouth daily., Disp: 30 tablet, Rfl: 6   vitamin B-12 (CYANOCOBALAMIN) 50 MCG tablet, Take 50 mcg by mouth daily., Disp: , Rfl:    VITAMIN D PO, Take 1 tablet by mouth daily., Disp: , Rfl:    XARELTO 20 MG TABS tablet, TAKE 1 Tablet BY MOUTH ONCE EVERY DAY WITH  SUPPER, Disp: 90 tablet, Rfl: 1     Review of Systems  Per HPI unless specifically indicated above     Objective:    There were no vitals taken for this visit.  Wt Readings from Last 3 Encounters:  04/25/22 173 lb 3.2 oz (78.6 kg)  03/14/22 175 lb (79.4 kg)  02/15/22 172 lb (78 kg)    Physical Exam Pulmonary:     Effort: Pulmonary effort is normal. No respiratory distress.     Comments: Pt is talking in complete sentences without dyspnea.   He is not heard to cough for entire > 15 minute phone call.  Neurological:     Mental Status: He is alert and oriented to person, place, and time.  Psychiatric:        Attention and Perception: Attention normal.        Speech: Speech normal.        Behavior: Behavior is cooperative.           Assessment & Plan:    Encounter Diagnoses  Name Primary?   Upper respiratory tract infection, unspecified type Yes   Tobacco use disorder      -pt is encouraged to Rest drink Fluids Avoid smoking for his URI which appears to be improving/resolveing -Will look for PAP for stiolto and let him know if anything is found -pt to F/u november as scheduled

## 2022-05-10 NOTE — Telephone Encounter (Signed)
Called and notified patients wife that I would leave a sample for patient at front in [place] office. She voiced understanding and states she will bring his pt assistance paperwork. Nothing further needed for now. Will update encounter once paperwork is received

## 2022-05-12 MED ORDER — STIOLTO RESPIMAT 2.5-2.5 MCG/ACT IN AERS: 2.0000 | INHALATION_SPRAY | Freq: Every day | RESPIRATORY_TRACT | 11 refills | Status: DC

## 2022-05-12 NOTE — Addendum Note (Signed)
Addended by: Fritzi Mandes D on: 05/12/2022 04:01 PM   Modules accepted: Orders

## 2022-05-12 NOTE — Telephone Encounter (Signed)
Pt brought in pt assistance forms.  Faxed to [facility]

## 2022-05-15 ENCOUNTER — Telehealth: Payer: Self-pay | Admitting: Cardiology

## 2022-05-15 NOTE — Telephone Encounter (Signed)
Richard [name], from [facility] called stating wife called them and stating someone had called her from hear and said the clearance that was done last month was good for his upcoming appt.  She was calling to verify if that was true.  Richard [name] can be reached at [phone], she will be out of the office from 12:30 - 3pm.  Will be back in the office from 3-5pm.

## 2022-05-15 NOTE — Telephone Encounter (Signed)
Left a message for Richard [name] to call back to discuss further.

## 2022-05-15 NOTE — Telephone Encounter (Signed)
I s/w tina who states the pt missed his Sept and Oct appt for dental procedure . Otila Kluver, states the procedure is now on 05/23/22 . She states the pt said someone called him today and said the pt is still good to proceed. Otila Kluver stated the dentist just wanted make sure this is correct. I did say that I do not see where pt s/w someone today. I did inform Otila Kluver that yes the clearance we will hold good for 2 months. I did tell her that if the pt does not have procedure by 06/03/22 we may have to do a new clearance all together. Otila Kluver thanked me for the help today and stated she will note our conversation. I stated that I will as well and fax over to her today to have on file.

## 2022-05-17 ENCOUNTER — Encounter (HOSPITAL_COMMUNITY): Payer: Self-pay | Admitting: Psychiatry

## 2022-05-17 ENCOUNTER — Telehealth (INDEPENDENT_AMBULATORY_CARE_PROVIDER_SITE_OTHER): Payer: Self-pay | Admitting: Psychiatry

## 2022-05-17 DIAGNOSIS — F4310 Post-traumatic stress disorder, unspecified: Secondary | ICD-10-CM

## 2022-05-17 DIAGNOSIS — F4001 Agoraphobia with panic disorder: Secondary | ICD-10-CM

## 2022-05-17 MED ORDER — ALPRAZOLAM 0.5 MG PO TABS: ORAL_TABLET | ORAL | 2 refills | Status: DC

## 2022-05-17 NOTE — Progress Notes (Signed)
Virtual Visit via Telephone Note  I connected with Richard [name] on 05/17/22 at 11:20 AM EDT by telephone and verified that I am speaking with the correct person using two identifiers.  Location: Patient: home Provider: office   I discussed the limitations, risks, security and privacy concerns of performing an evaluation and management service by telephone and the availability of in person appointments. I also discussed with the patient that there may be a patient responsible charge related to this service. The patient expressed understanding and agreed to proceed.      I discussed the assessment and treatment plan with the patient. The patient was provided an opportunity to ask questions and all were answered. The patient agreed with the plan and demonstrated an understanding of the instructions.   The patient was advised to call back or seek an in-person evaluation if the symptoms worsen or if the condition fails to improve as anticipated.  I provided 15 minutes of non-face-to-face time during this encounter.   [name], MD  [facility] MD/PA/NP OP Progress Note  05/17/2022 11:38 AM Richard [name]  MRN:  [id number]  Chief Complaint:  Chief Complaint  Patient presents with   Anxiety   Follow-up   HPI: This patient is a 56 year old Caucasian male lives with his wife in [place].  He is unemployed.  The patient returns for follow-up after 3 months regarding his anxiety.  For the most part he is doing about the same.  He has started therapy with Maye Hides in our office which is helped to some degree.  The Xanax really helps with his anxiety.  He is still struggling financially.  He worries about a lot of things but is not seriously depressed.  He has tried antidepressants but they always seem to make him feel worse.  He still is having trouble with his A-fib and he thinks that certain foods set it off.  He is explained this to cardiology but has not gotten much of an  explanation. Visit Diagnosis:    ICD-10-CM   1. PTSD (post-traumatic stress disorder)  F43.10     2. 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 Cardiac cath 07/2015 showed 65% distal Cx, 20% mid-distal LAD, 20% prox-distal RCA, EF 60%, EDP 40mHg.   Colitis 1990   COPD (chronic obstructive pulmonary disease) ([place]    Depression    Essential hypertension    Gastric ulcer 2003; 2012   2003: + esophagitis; negative H.pylori serology  2012: [name] mild gastritis, Bravo PH probe placement, negative H.pylori   GERD (gastroesophageal reflux disease)    Hepatic steatosis    History of hiatal hernia    Hyperlipemia    Overweight    Panic attacks    Paroxysmal atrial fibrillation ([facility]    Stroke ([facility]    TIA (transient ischemic attack)    Type II diabetes mellitus ([facility]     Past Surgical History:  Procedure Laterality Date   BALLOON DILATION N/A 07/25/2021   Procedure: BALLOON DILATION;  Surgeon: CEloise Harman DO;  [facility];  Service: Endoscopy;  Laterality: N/A;   BIOPSY  07/25/2021   Procedure: BIOPSY;  Surgeon: CEloise Harman DO;  [facility];  Service: Endoscopy;;  BRAVO [place] STUDY  05/03/2011   WUJ:WJXB gastritis/normal esophagus and duodenum   CARDIAC CATHETERIZATION  1990s X 1; 2005; 08/12/2015   CARDIAC CATHETERIZATION N/A 08/12/2015   Procedure: Left Heart Cath and Coronary Angiography;  Surgeon: Belva Crome, MD; LAD 20%, CFX 65%, RCA 20%, EF 60%    COLONOSCOPY  1990   COLONOSCOPY WITH PROPOFOL N/A 11/21/2016   Dr. Oneida Alar: non-thrombosed external hemorrhoids, one 6 mm polyp (polypoid lesion), internal hemorrhoids. TI Normal. 10 years screening   ESOPHAGOGASTRODUODENOSCOPY  05/03/2011   JYN:WGNF gastritis   ESOPHAGOGASTRODUODENOSCOPY (EGD) WITH PROPOFOL N/A  07/25/2021   Procedure: ESOPHAGOGASTRODUODENOSCOPY (EGD) WITH PROPOFOL;  Surgeon: Eloise Harman, DO;  [facility];  Service: Endoscopy;  Laterality: N/A;  1:30pm   NECK MASS EXCISION Right    "done in dr's office; behind right ear/side of ncek"   POLYPECTOMY  11/21/2016   Procedure: POLYPECTOMY;  Surgeon: [name], MD;  [facility];  Service: Endoscopy;;  descending colon polyp   SHOULDER ARTHROSCOPY W/ ROTATOR CUFF REPAIR Right 2006   acromioclavicular joint arthrosis    Family Psychiatric History: See below  Family History:  Family History  Problem Relation Age of Onset   Lung cancer Mother    Alcohol abuse Mother    Heart attack Father 50   Diabetes Father    Alcohol abuse Father    Hypertension Brother    Hypertension Brother    Anxiety disorder Sister    Depression Sister    Anxiety disorder Sister    Heart attack Brother 49   Diabetes Brother    Hypertension Brother    Seizures Brother    Dementia Paternal Uncle    Dementia Cousin    ADD / ADHD Daughter    Colon cancer Neg Hx    Drug abuse Neg Hx    Bipolar disorder Neg Hx    OCD Neg Hx    Paranoid behavior Neg Hx    Schizophrenia Neg Hx    Sexual abuse Neg Hx    Physical abuse Neg Hx     Social History:  Social History   Socioeconomic History   Marital status: Married    Spouse name: Not on file   Number of children: Not on file   Years of education: Not on file   Highest education level: Not on file  Occupational History   Occupation: full time    Employer: UNEMPLOYED  Tobacco Use   Smoking status: Every Day    Packs/day: 0.25    Years: 25.00    Total pack years: 6.25    Types: Cigarettes    Start date: 07/17/1982   Smokeless tobacco: Never   Tobacco comments:    4-6 cigarettes daily 04/27/2021  Vaping Use   Vaping Use: Never used  Substance and Sexual Activity   Alcohol use: No    Alcohol/week: 0.0 standard drinks of alcohol   Drug use: No   Sexual activity:  Yes    Birth control/protection: None  Other Topics Concern   Not on file  Social History Narrative   Pt lives in [place] with wife.  5 children.  Unemployed due to panic attacks and back pain   Social Determinants of Health   Financial Resource Strain: Not on file  Food Insecurity: Not on file  Transportation Needs: Not on file  Physical Activity: Not on file  Stress: Not on file  Social Connections: Not on file    Allergies:  Allergies  Allergen Reactions   Dexilant [Dexlansoprazole] Anaphylaxis   Mushroom Ext Cmplx(Shiitake-Reishi-Mait) Anaphylaxis    Rapid heart rate.   Penicillins Anaphylaxis    Has patient had a PCN reaction causing immediate rash, facial/tongue/throat swelling, SOB or lightheadedness with hypotension: Yes Has patient had a PCN reaction causing severe rash involving mucus membranes or skin necrosis: No Has patient had a PCN reaction that required hospitalization Yes Has patient had a PCN reaction occurring within the last 10 years: No If all of the above answers are "NO", then may proceed with Cephalosporin use.    Doxycycline Nausea And Vomiting         Metabolic Disorder Labs: Lab Results  Component Value Date   HGBA1C 5.5 02/16/2022   MPG 111.15 02/16/2022   MPG 114.02 11/09/2021   No results found for: "PROLACTIN" Lab Results  Component Value Date   CHOL 111 02/16/2022   TRIG 59 02/16/2022   HDL 38 (L) 02/16/2022   CHOLHDL 2.9 02/16/2022   VLDL 12 02/16/2022   LDLCALC 61 02/16/2022   LDLCALC 60 11/09/2021   Lab Results  Component Value Date   TSH 1.876 11/09/2021   TSH 1.230 07/20/2021    Therapeutic Level Labs: No results found for: "LITHIUM" No results found for: "VALPROATE" No results found for: "CBMZ"  Current Medications: Current Outpatient Medications  Medication Sig Dispense Refill   albuterol (VENTOLIN HFA) 108 (90 Base) MCG/ACT inhaler INHALE 2 PUFFS BY MOUTH EVERY 6 HOURS AS NEEDED FOR COUGHING, WHEEZING, OR  SHORTNESS OF BREATH 20.1 g 0   ALPRAZolam (XANAX) 0.5 MG tablet Take one twice a day and two at bedtime 120 tablet 2   amLODipine (NORVASC) 10 MG tablet TAKE 1 Tablet BY MOUTH ONCE DAILY 90 tablet 1   Budeson-Glycopyrrol-Formoterol (BREZTRI AEROSPHERE) 160-9-4.8 MCG/ACT AERO Inhale 2 puffs into the lungs in the morning and at bedtime. (Patient not taking: Reported on 05/10/2022) 10.7 g 0   dronedarone (MULTAQ) 400 MG tablet Take 1 tablet (400 mg total) by mouth 2 (two) times daily with a meal. 60 tablet 11   esomeprazole (NEXIUM) 40 MG capsule Take 1 capsule (40 mg total) by mouth 2 (two) times daily before a meal. 60 capsule 11   famotidine (PEPCID) 20 MG tablet Take 1 tablet (20 mg total) by mouth 2 (two) times daily. (Patient taking differently: Take 20 mg by mouth 3 (three) times daily.) 180 tablet 3   metoprolol succinate (TOPROL XL) 25 MG 24 hr tablet Take 0.5 tablets (12.5 mg total) by mouth daily. 45 tablet 3   nitroGLYCERIN (NITROSTAT) 0.4 MG SL tablet Place 1 tablet (0.4 mg total) under the tongue every 5 (five) minutes x 3 doses as needed for chest pain (if no relief after 3rd dose, proceed to the ED for an evaluation). 25 tablet 3   simvastatin (ZOCOR) 20 MG tablet Take 1 tablet (20 mg total) by mouth daily. 30 tablet 6   Tiotropium Bromide-Olodaterol (STIOLTO RESPIMAT) 2.5-2.5 MCG/ACT AERS Inhale 2 puffs into the lungs daily. 4 g 11   vitamin B-12 (CYANOCOBALAMIN) 50 MCG tablet Take 50 mcg by mouth daily.     VITAMIN D PO Take 1 tablet by mouth daily.     XARELTO 20 MG TABS tablet TAKE 1 Tablet BY MOUTH ONCE EVERY DAY WITH SUPPER 90 tablet 1   No current facility-administered medications for this visit.     Musculoskeletal: Strength & Muscle Tone: na Gait & Station: na Patient leans: N/A  Psychiatric Specialty Exam: Review  of Systems  Cardiovascular:  Positive for palpitations.  Psychiatric/Behavioral:  The patient is nervous/anxious.   All other systems reviewed and are  negative.   There were no vitals taken for this visit.There is no height or weight on file to calculate BMI.  General Appearance: NA  Eye Contact:  NA  Speech:  Clear and Coherent  Volume:  Normal  Mood:  Anxious  Affect:  NA  Thought Process:  Goal Directed  Orientation:  Full (Time, Place, and Person)  Thought Content: Rumination   Suicidal Thoughts:  No  Homicidal Thoughts:  No  Memory:  Immediate;   Good Recent;   Good Remote;   Good  Judgement:  Good  Insight:  Fair  Psychomotor Activity:  Decreased  Concentration:  Concentration: Fair and Attention Span: Fair  Recall:  [place] of Knowledge: Good  Language: Good  Akathisia:  No  Handed:  Right  AIMS (if indicated): not done  Assets:  Communication Skills Desire for Improvement Resilience Social Support Talents/Skills  ADL's:  Intact  Cognition: WNL  Sleep:  Good   Screenings: GAD-7    Flowsheet Row Counselor from 08/24/2021 in [facility]  Total GAD-7 Score 20      PHQ2-9    Flowsheet Row Video Visit from 11/29/2021 in [facility] Video Visit from 09/06/2021 in [place] Counselor from 08/24/2021 in [facility] [facility] Video Visit from 06/16/2021 in [place] Patient Outreach Telephone from 08/30/2015 in [place]  PHQ-2 Total Score '1 5 4 1 2  '$ PHQ-9 Total Score -- 12 16 -- 8      Flowsheet Row Video Visit from 11/29/2021 in [place] [facility] from 09/27/2021 in [facility] Video Visit from 09/06/2021 in [place] No Risk No Risk No Risk        Assessment and Plan: This patient is a 56 year old male with a history of anxiety and panic attacks.  He does feel the  Xanax is helpful so we will continue 0.5 mg twice daily and 1 mg at bedtime.  He will return to see me in 3 months  Collaboration of Care: Collaboration of Care: Referral or follow-up with counselor/therapist AEB patient will continue therapy with Maye Hides in our office  Patient/Guardian was advised Release of Information must be obtained prior to any record release in order to collaborate their care with an outside provider. Patient/Guardian was advised if they have not already done so to contact the registration department to sign all necessary forms in order for [name] to release information regarding their care.   Consent: Patient/Guardian gives verbal consent for treatment and assignment of benefits for services provided during this visit. Patient/Guardian expressed understanding and agreed to proceed.    [name], MD 05/17/2022, 11:38 AM

## 2022-05-18 ENCOUNTER — Telehealth: Payer: Self-pay | Admitting: *Deleted

## 2022-05-18 NOTE — Telephone Encounter (Signed)
Patient called to clarify his medication if he can take albuterol with his other inhalor. please call and advise

## 2022-05-19 NOTE — Telephone Encounter (Signed)
Called and spoke with patient. He wanted to know if it was ok for him to use the albuterol while using Stiolto. I advised him that it would be ok for him to use the albuterol but only on an as needed basis, not scheduled like Stiolto. He verbalized understanding.   Nothing further needed at time of call.

## 2022-05-22 ENCOUNTER — Other Ambulatory Visit: Payer: Self-pay | Admitting: Physician Assistant

## 2022-05-22 DIAGNOSIS — I2510 Atherosclerotic heart disease of native coronary artery without angina pectoris: Secondary | ICD-10-CM

## 2022-05-22 DIAGNOSIS — Z125 Encounter for screening for malignant neoplasm of prostate: Secondary | ICD-10-CM

## 2022-05-22 DIAGNOSIS — E785 Hyperlipidemia, unspecified: Secondary | ICD-10-CM

## 2022-05-22 DIAGNOSIS — I10 Essential (primary) hypertension: Secondary | ICD-10-CM

## 2022-06-02 ENCOUNTER — Telehealth: Payer: Self-pay | Admitting: Internal Medicine

## 2022-06-02 NOTE — Telephone Encounter (Signed)
Called and spoke with patient. Application was faxed on 05/12/22. Gave him [facility] number to call and check on status [phone] and let him know I would place 2 stiolto samples at front [facility]  office for him. Nothing further needed

## 2022-06-13 ENCOUNTER — Other Ambulatory Visit (HOSPITAL_COMMUNITY)
Admission: RE | Admit: 2022-06-13 | Discharge: 2022-06-13 | Disposition: A | Discharge status: Home / Self Care | Payer: Self-pay | Source: Ambulatory Visit | Attending: Physician Assistant | Admitting: Physician Assistant

## 2022-06-13 DIAGNOSIS — E785 Hyperlipidemia, unspecified: Secondary | ICD-10-CM | POA: Insufficient documentation

## 2022-06-13 DIAGNOSIS — Z125 Encounter for screening for malignant neoplasm of prostate: Secondary | ICD-10-CM | POA: Insufficient documentation

## 2022-06-13 DIAGNOSIS — I2510 Atherosclerotic heart disease of native coronary artery without angina pectoris: Secondary | ICD-10-CM | POA: Insufficient documentation

## 2022-06-13 DIAGNOSIS — I10 Essential (primary) hypertension: Secondary | ICD-10-CM | POA: Insufficient documentation

## 2022-06-13 LAB — LIPID PANEL
Cholesterol: 124 mg/dL (ref 0–200)
HDL: 43 mg/dL (ref >40)
LDL Cholesterol: 73 mg/dL (ref 0–99)
Total CHOL/HDL Ratio: 2.9 RATIO
Triglycerides: 38 mg/dL (ref <150)
VLDL: 8 mg/dL (ref 0–40)

## 2022-06-13 LAB — COMPREHENSIVE METABOLIC PANEL
ALT: 29 U/L (ref 0–44)
AST: 18 U/L (ref 15–41)
Albumin: 4.1 g/dL (ref 3.5–5.0)
Alkaline Phosphatase: 46 U/L (ref 38–126)
Anion gap: 5 (ref 5–15)
BUN: 20 mg/dL (ref 6–20)
CO2: 28 mmol/L (ref 22–32)
Calcium: 8.8 mg/dL — ABNORMAL LOW (ref 8.9–10.3)
Chloride: 107 mmol/L (ref 98–111)
Creatinine, Ser: 0.82 mg/dL (ref 0.61–1.24)
GFR, Estimated: >60 mL/min (ref >60)
Glucose, Bld: 117 mg/dL — ABNORMAL HIGH (ref 70–99)
Potassium: 3.6 mmol/L (ref 3.5–5.1)
Sodium: 140 mmol/L (ref 135–145)
Total Bilirubin: 0.4 mg/dL (ref 0.3–1.2)
Total Protein: 6.8 g/dL (ref 6.5–8.1)

## 2022-06-13 LAB — PSA: Prostatic Specific Antigen: 0.61 ng/mL (ref 0.00–4.00)

## 2022-06-14 ENCOUNTER — Ambulatory Visit: Payer: Self-pay | Admitting: Physician Assistant

## 2022-06-14 ENCOUNTER — Telehealth: Payer: Self-pay | Admitting: Internal Medicine

## 2022-06-14 ENCOUNTER — Encounter: Payer: Self-pay | Admitting: Physician Assistant

## 2022-06-14 VITALS — BP 121/67 | HR 64 | Temp 98.4°F | Ht 71.0 in | Wt 175.5 lb

## 2022-06-14 DIAGNOSIS — R918 Other nonspecific abnormal finding of lung field: Secondary | ICD-10-CM

## 2022-06-14 DIAGNOSIS — E785 Hyperlipidemia, unspecified: Secondary | ICD-10-CM

## 2022-06-14 DIAGNOSIS — F17200 Nicotine dependence, unspecified, uncomplicated: Secondary | ICD-10-CM

## 2022-06-14 DIAGNOSIS — I10 Essential (primary) hypertension: Secondary | ICD-10-CM

## 2022-06-14 DIAGNOSIS — I2510 Atherosclerotic heart disease of native coronary artery without angina pectoris: Secondary | ICD-10-CM

## 2022-06-14 NOTE — Progress Notes (Signed)
BP 121/67   Pulse 64   Temp 98.4 F (36.9 C)   Ht '5\' 11"'$  (1.803 m)   Wt 175 lb 8 oz (79.6 kg)   SpO2 98%   BMI 24.48 kg/m    Subjective:    Patient ID: Richard [name], male    DOB: [birth year], 56 y.o.   MRN: [id number]  HPI: Richard [name] is a 56 y.o. male presenting on 06/14/2022 for Coronary Artery Disease, Hypertension, and Hyperlipidemia   HPI  Chief Complaint  Patient presents with   Coronary Artery Disease   Hypertension   Hyperlipidemia     Pt says he is Doing okay except achey due to the cold weather today.  He has no new issues.   He is planning to apply for medicaid on December 1.  He is using space heaters so his home is cold- was 52 degrees this morning.  SPECIALISTS:   Dr Melvyn Novas- pulmonology   -COPD Dr Jannet Askew- cardiology   -  myopathy [name] for [facility]  - panic d/o, depression and PTSD Dr Abbey Chatters- GI   -  constipation and GERD Dr Donnetta Hutching-  vascular surgery-   aorto-iliac disease      Relevant past medical, surgical, family and social history reviewed and updated as indicated. Interim medical history since our last visit reviewed. Allergies and medications reviewed and updated.    Current Outpatient Medications:    albuterol (VENTOLIN HFA) 108 (90 Base) MCG/ACT inhaler, INHALE 2 PUFFS BY MOUTH EVERY 6 HOURS AS NEEDED FOR COUGHING, WHEEZING, OR SHORTNESS OF BREATH, Disp: 20.1 g, Rfl: 0   ALPRAZolam (XANAX) 0.5 MG tablet, Take one twice a day and two at bedtime, Disp: 120 tablet, Rfl: 2   amLODipine (NORVASC) 10 MG tablet, TAKE 1 Tablet BY MOUTH ONCE DAILY, Disp: 90 tablet, Rfl: 1   dronedarone (MULTAQ) 400 MG tablet, Take 1 tablet (400 mg total) by mouth 2 (two) times daily with a meal., Disp: 60 tablet, Rfl: 11   esomeprazole (NEXIUM) 40 MG capsule, Take 1 capsule (40 mg total) by mouth 2 (two) times daily before a meal., Disp: 60 capsule, Rfl: 11   famotidine (PEPCID) 20 MG tablet, Take 1 tablet (20 mg total) by mouth 2 (two) times daily. (Patient  taking differently: Take 20 mg by mouth 3 (three) times daily.), Disp: 180 tablet, Rfl: 3   metoprolol succinate (TOPROL XL) 25 MG 24 hr tablet, Take 0.5 tablets (12.5 mg total) by mouth daily., Disp: 45 tablet, Rfl: 3   nitroGLYCERIN (NITROSTAT) 0.4 MG SL tablet, Place 1 tablet (0.4 mg total) under the tongue every 5 (five) minutes x 3 doses as needed for chest pain (if no relief after 3rd dose, proceed to the ED for an evaluation)., Disp: 25 tablet, Rfl: 3   simvastatin (ZOCOR) 20 MG tablet, Take 1 tablet (20 mg total) by mouth daily., Disp: 30 tablet, Rfl: 6   Tiotropium Bromide-Olodaterol (STIOLTO RESPIMAT) 2.5-2.5 MCG/ACT AERS, Inhale 2 puffs into the lungs daily., Disp: 4 g, Rfl: 11   vitamin B-12 (CYANOCOBALAMIN) 50 MCG tablet, Take 50 mcg by mouth daily., Disp: , Rfl:    VITAMIN D PO, Take 1 tablet by mouth daily., Disp: , Rfl:    XARELTO 20 MG TABS tablet, TAKE 1 Tablet BY MOUTH ONCE EVERY DAY WITH SUPPER, Disp: 90 tablet, Rfl: 1   Budeson-Glycopyrrol-Formoterol (BREZTRI AEROSPHERE) 160-9-4.8 MCG/ACT AERO, Inhale 2 puffs into the lungs in the morning and at bedtime. (Patient not taking: Reported on  05/10/2022), Disp: 10.7 g, Rfl: 0    Review of Systems  Per HPI unless specifically indicated above     Objective:    BP 121/67   Pulse 64   Temp 98.4 F (36.9 C)   Ht '5\' 11"'$  (1.803 m)   Wt 175 lb 8 oz (79.6 kg)   SpO2 98%   BMI 24.48 kg/m   Wt Readings from Last 3 Encounters:  06/14/22 175 lb 8 oz (79.6 kg)  04/25/22 173 lb 3.2 oz (78.6 kg)  03/14/22 175 lb (79.4 kg)    Physical Exam Vitals reviewed.  Constitutional:      General: He is not in acute distress.    Appearance: He is well-developed. He is not ill-appearing.  HENT:     Head: Normocephalic and atraumatic.  Cardiovascular:     Rate and Rhythm: Normal rate and regular rhythm.  Pulmonary:     Effort: Pulmonary effort is normal.     Breath sounds: Normal breath sounds. No wheezing.  Abdominal:     General:  Bowel sounds are normal.     Palpations: Abdomen is soft.     Tenderness: There is no abdominal tenderness.  Musculoskeletal:     Cervical back: Neck supple.     Right lower leg: No edema.     Left lower leg: No edema.  Lymphadenopathy:     Cervical: No cervical adenopathy.  Skin:    General: Skin is warm and dry.  Neurological:     Mental Status: He is alert and oriented to person, place, and time.  Psychiatric:        Behavior: Behavior normal.     Results for orders placed or performed during the hospital encounter of 06/13/22  Comprehensive metabolic panel  Result Value Ref Range   Sodium 140 135 - 145 mmol/L   Potassium 3.6 3.5 - 5.1 mmol/L   Chloride 107 98 - 111 mmol/L   CO2 28 22 - 32 mmol/L   Glucose, Bld 117 (H) 70 - 99 mg/dL   BUN 20 6 - 20 mg/dL   Creatinine, Ser 0.82 0.61 - 1.24 mg/dL   Calcium 8.8 (L) 8.9 - 10.3 mg/dL   Total Protein 6.8 6.5 - 8.1 g/dL   Albumin 4.1 3.5 - 5.0 g/dL   AST 18 15 - 41 U/L   ALT 29 0 - 44 U/L   Alkaline Phosphatase 46 38 - 126 U/L   Total Bilirubin 0.4 0.3 - 1.2 mg/dL   GFR, Estimated >60 >60 mL/min   Anion gap 5 5 - 15  Lipid panel  Result Value Ref Range   Cholesterol 124 0 - 200 mg/dL   Triglycerides 38 <150 mg/dL   HDL 43 >40 mg/dL   Total CHOL/HDL Ratio 2.9 RATIO   VLDL 8 0 - 40 mg/dL   LDL Cholesterol 73 0 - 99 mg/dL  PSA  Result Value Ref Range   Prostatic Specific Antigen 0.61 0.00 - 4.00 ng/mL      Assessment & Plan:   Encounter Diagnoses  Name Primary?   Coronary artery disease involving native heart without angina pectoris, unspecified vessel or lesion type Yes   Hyperlipidemia, unspecified hyperlipidemia type    Essential hypertension    Tobacco use disorder    Pulmonary nodules      -reviewed labs with pt -pt to continue current meds/no changes -pt to continue with specialists per their recommendation -pt will need to repeat chest CT March 2024 -pt to follow up  3 months. She is to contact office  sooner prn

## 2022-06-14 NOTE — Telephone Encounter (Signed)
Called and spoke with patients wife [name]. She states that the pt assistance program did not receive prescription. Will refax rx. Patient is coming by today to pick up samples. Nothing further needed at this time.

## 2022-06-19 ENCOUNTER — Other Ambulatory Visit: Payer: Self-pay | Admitting: Physician Assistant

## 2022-06-19 ENCOUNTER — Other Ambulatory Visit: Payer: Self-pay | Admitting: Cardiology

## 2022-06-19 NOTE — Telephone Encounter (Signed)
Prescription refill request for Xarelto received.  Indication: AF/TIA Last office visit: 04/24/22  Elwyn Reach NP Weight: 79.6kg Age: 56 Scr: 0.82 on 06/13/22 CrCl: 111.90  Based on above findings Xarelto '20mg'$  daily is the appropriate dose.  Refill approved.

## 2022-07-14 ENCOUNTER — Other Ambulatory Visit: Payer: Self-pay | Admitting: Pharmacist

## 2022-07-17 DIAGNOSIS — Z419 Encounter for procedure for purposes other than remedying health state, unspecified: Secondary | ICD-10-CM | POA: Diagnosis not present

## 2022-07-25 ENCOUNTER — Telehealth: Payer: Self-pay | Admitting: Cardiology

## 2022-07-25 NOTE — Telephone Encounter (Signed)
   Pre-operative Risk Assessment    Patient Name: Richard [name]  DOB: 11/23/1965 MRN: [id number]     Request for Surgical Clearance    Procedure:  Dental Extraction - Amount of Teeth to be Pulled:  3  Date of Surgery:  Clearance 08/07/22                                 Surgeon:  Erik Obey  Surgeon's Group or Practice Name:  [facility] Dept  Phone number:  [phone] Fax number:  [phone]   Type of Clearance Requested:   - Medical    Type of Anesthesia:   local anesthetic with epinephrine   Additional requests/questions:    Sandrea Hammond   07/25/2022, 12:21 PM

## 2022-07-26 ENCOUNTER — Telehealth: Payer: Self-pay | Admitting: *Deleted

## 2022-07-26 MED ORDER — NITROGLYCERIN 0.4 MG SL SUBL: 0.4000 mg | SUBLINGUAL_TABLET | SUBLINGUAL | 3 refills | Status: DC | PRN

## 2022-07-26 MED ORDER — MULTAQ 400 MG PO TABS: 400.0000 mg | ORAL_TABLET | Freq: Two times a day (BID) | ORAL | 3 refills | Status: DC

## 2022-07-26 NOTE — Telephone Encounter (Signed)
   Name: Richard [name]  DOB: 30-Oct-1965  MRN: [id number]  Primary Cardiologist: Rozann Lesches, MD  Chart reviewed as part of pre-operative protocol coverage. Because of Richard [name]'s past medical history and time since last visit, he will require a follow-up telephone visit in order to better assess preoperative cardiovascular risk.  Pre-op covering staff: - Please schedule appointment and call patient to inform them. If patient already had an upcoming appointment within acceptable timeframe, please add "pre-op clearance" to the appointment notes so provider is aware. - Please contact requesting surgeon's office via preferred method (i.e, phone, fax) to inform them of need for appointment prior to surgery.  No medications indicated as needing held.  Elgie Collard, PA-C  07/26/2022, 8:31 AM

## 2022-07-26 NOTE — Telephone Encounter (Signed)
  Patient Consent for Virtual Visit         Richard [name] has provided verbal consent on 07/26/2022 for a virtual visit (video or telephone).   CONSENT FOR VIRTUAL VISIT FOR:  Richard [name]  By participating in this virtual visit I agree to the following:  I hereby voluntarily request, consent and authorize [facility] and its employed or contracted physicians, physician assistants, nurse practitioners or other licensed health care professionals (the Practitioner), to provide me with telemedicine health care services (the "Services") as deemed necessary by the treating Practitioner. I acknowledge and consent to receive the Services by the Practitioner via telemedicine. I understand that the telemedicine visit will involve communicating with the Practitioner through live audiovisual communication technology and the disclosure of certain medical information by electronic transmission. I acknowledge that I have been given the opportunity to request an in-person assessment or other available alternative prior to the telemedicine visit and am voluntarily participating in the telemedicine visit.  I understand that I have the right to withhold or withdraw my consent to the use of telemedicine in the course of my care at any time, without affecting my right to future care or treatment, and that the Practitioner or I may terminate the telemedicine visit at any time. I understand that I have the right to inspect all information obtained and/or recorded in the course of the telemedicine visit and may receive copies of available information for a reasonable fee.  I understand that some of the potential risks of receiving the Services via telemedicine include:  Delay or interruption in medical evaluation due to technological equipment failure or disruption; Information transmitted may not be sufficient (e.g. poor resolution of images) to allow for appropriate medical decision making by the  Practitioner; and/or  In rare instances, security protocols could fail, causing a breach of personal health information.  Furthermore, I acknowledge that it is my responsibility to provide information about my medical history, conditions and care that is complete and accurate to the best of my ability. I acknowledge that Practitioner's advice, recommendations, and/or decision may be based on factors not within their control, such as incomplete or inaccurate data provided by me or distortions of diagnostic images or specimens that may result from electronic transmissions. I understand that the practice of medicine is not an exact science and that Practitioner makes no warranties or guarantees regarding treatment outcomes. I acknowledge that a copy of this consent can be made available to me via my patient portal ([place]), or I can request a printed copy by calling the office of [facility].    I understand that my insurance will be billed for this visit.   I have read or had this consent read to me. I understand the contents of this consent, which adequately explains the benefits and risks of the Services being provided via telemedicine.  I have been provided ample opportunity to ask questions regarding this consent and the Services and have had my questions answered to my satisfaction. I give my informed consent for the services to be provided through the use of telemedicine in my medical care

## 2022-07-27 ENCOUNTER — Other Ambulatory Visit: Payer: Self-pay | Admitting: Internal Medicine

## 2022-07-28 ENCOUNTER — Telehealth: Payer: Self-pay | Admitting: Cardiology

## 2022-07-28 MED ORDER — METOPROLOL SUCCINATE ER 25 MG PO TB24: 12.5000 mg | ORAL_TABLET | Freq: Every day | ORAL | 3 refills | Status: DC

## 2022-07-28 NOTE — Telephone Encounter (Signed)
Refilled per request,changed pharmacy to [facility]

## 2022-07-28 NOTE — Telephone Encounter (Signed)
*  STAT* If patient is at the pharmacy, call can be transferred to refill team.   1. Which medications need to be refilled? (please list name of each medication and dose if known) metoprolol succinate (TOPROL XL) 25 MG 24 hr tablet   2. Which pharmacy/location (including street and city if local pharmacy) is medication to be sent to? [id number]- [place], [place] - [address]  3. Do they need a 30 day or 90 day supply? 90 day

## 2022-07-30 NOTE — Progress Notes (Unsigned)
Virtual Visit via Telephone Note   Because of Richard [name]'s co-morbid illnesses, he is at least at moderate risk for complications without adequate follow up.  This format is felt to be most appropriate for this patient at this time.  The patient did not have access to video technology/had technical difficulties with video requiring transitioning to audio format only (telephone).  All issues noted in this document were discussed and addressed.  No physical exam could be performed with this format.  Please refer to the patient's chart for his consent to telehealth for [facility].  Evaluation Performed:  Preoperative cardiovascular risk assessment _____________   Date:  07/30/2022   Patient ID:  Richard [name], DOB [birth year], MRN [id number] Patient Location:  Home Provider location:   Office  Primary Care Provider:  No primary care provider on file. Primary Cardiologist:  Rozann Lesches, MD  Chief Complaint / Patient Profile   57 y.o. y/o male with a h/o PAF on chronic anticoagulation and AAD, tobacco abuse, HTN, COPD, and cardiac catheterization 2017 with 65% dist Cx lesion, 20% stenosis mid to dis LAD and prox to dist RCA  who is pending dental extraction of 3 teeth and presents today for telephonic preoperative cardiovascular risk assessment.  History of Present Illness    Richard [name] is a 57 y.o. male who presents via audio/video conferencing for a telehealth visit today.  Pt was last seen in cardiology clinic on 11/03/2021 by [name]. During his vi Richard [name] was doing well with controlled blood pressure and rate control on Multaq.  The patient is now pending procedure as outlined above. Since his last visit, he manage he is doing well with no new cardiac complaints.  He reports that he is compliant with his current medication regimen and denies any adverse reactions.  He denies chest pain, shortness of breath, lower extremity edema, fatigue, palpitations,  melena, hematuria, hemoptysis, diaphoresis, weakness, presyncope, syncope, orthopnea, and PND.  He reported some discomfort in his left arm with swelling and numbness.  He reported that pain resolved with elevation and I informed him to follow-up with his PCP regarding further treatment options.  Past Medical History    Past Medical History:  Diagnosis Date   Arthritis    Asthma    CAD (coronary artery disease)    a. Cardiac cath 07/2015 showed 65% distal Cx, 20% mid-distal LAD, 20% prox-distal RCA, EF 60%, EDP 87mHg.   Colitis 1990   COPD (chronic obstructive pulmonary disease) [place]    Depression    Essential hypertension    Gastric ulcer 2003; 2012   2003: + esophagitis; negative H.pylori serology  2012: [name] mild gastritis, Bravo PH probe placement, negative H.pylori   GERD (gastroesophageal reflux disease)    Hepatic steatosis    History of hiatal hernia    Hyperlipemia    Overweight    Panic attacks    Paroxysmal atrial fibrillation ([place]    [place]    TIA (transient ischemic attack)    Type II diabetes mellitus ([place]    Past Surgical History:  Procedure Laterality Date   BALLOON DILATION N/A 07/25/2021   Procedure: BALLOON DILATION;  Surgeon: CEloise Harman DO;  [facility];  Service: Endoscopy;  Laterality: N/A;   BIOPSY  07/25/2021   Procedure: BIOPSY;  Surgeon: CEloise Harman DO;  [facility];  Service: Endoscopy;;   BRAVO PRupertSTUDY  05/03/2011   SQVZ:DGLOgastritis/normal esophagus  and duodenum   CARDIAC CATHETERIZATION  1990s X 1; 2005; 08/12/2015   CARDIAC CATHETERIZATION N/A 08/12/2015   Procedure: Left Heart Cath and Coronary Angiography;  Surgeon: Belva Crome, MD; LAD 20%, CFX 65%, RCA 20%, EF 60%    COLONOSCOPY  1990   COLONOSCOPY WITH PROPOFOL N/A 11/21/2016   Dr. Oneida Alar: non-thrombosed external hemorrhoids, one 6 mm polyp (polypoid lesion), internal hemorrhoids. TI Normal. 10 years screening   ESOPHAGOGASTRODUODENOSCOPY   05/03/2011   YYT:KPTW gastritis   ESOPHAGOGASTRODUODENOSCOPY (EGD) WITH PROPOFOL N/A 07/25/2021   Procedure: ESOPHAGOGASTRODUODENOSCOPY (EGD) WITH PROPOFOL;  Surgeon: Eloise Harman, DO;  [facility];  Service: Endoscopy;  Laterality: N/A;  1:30pm   NECK MASS EXCISION Right    "done in dr's office; behind right ear/side of ncek"   POLYPECTOMY  11/21/2016   Procedure: POLYPECTOMY;  Surgeon: [name], MD;  [facility];  Service: Endoscopy;;  descending colon polyp   SHOULDER ARTHROSCOPY W/ ROTATOR CUFF REPAIR Right 2006   acromioclavicular joint arthrosis    Allergies  Allergies  Allergen Reactions   Dexilant [Dexlansoprazole] Anaphylaxis   Mushroom Ext Cmplx(Shiitake-Reishi-Mait) Anaphylaxis    Rapid heart rate.   Penicillins Anaphylaxis    Has patient had a PCN reaction causing immediate rash, facial/tongue/throat swelling, SOB or lightheadedness with hypotension: Yes Has patient had a PCN reaction causing severe rash involving mucus membranes or skin necrosis: No Has patient had a PCN reaction that required hospitalization Yes Has patient had a PCN reaction occurring within the last 10 years: No If all of the above answers are "NO", then may proceed with Cephalosporin use.    Doxycycline Nausea And Vomiting         Home Medications    Prior to Admission medications   Medication Sig Start Date End Date Taking? Authorizing Provider  albuterol (VENTOLIN HFA) 108 (90 Base) MCG/ACT inhaler INHALE 2 PUFFS BY MOUTH EVERY 6 HOURS AS NEEDED FOR COUGHING, WHEEZING, OR SHORTNESS OF BREATH 06/19/22   [name], PA-C  ALPRAZolam Duanne Moron) 0.5 MG tablet Take one twice a day and two at bedtime 05/17/22   Cloria Spring, MD  amLODipine (NORVASC) 10 MG tablet TAKE 1 Tablet BY MOUTH ONCE DAILY 06/19/22   Satira Sark, MD  dronedarone (MULTAQ) 400 MG tablet Take 1 tablet (400 mg total) by mouth 2 (two) times daily with a meal. 07/26/22 07/27/23  Evans Lance, MD  esomeprazole (NEXIUM) 40 MG capsule Take 1 capsule (40 mg total) by mouth 2 (two) times daily before a meal. 11/17/21 11/17/22  Eloise Harman, DO  famotidine (PEPCID) 20 MG tablet Take 20 mg by mouth at bedtime.    [provider]  metoprolol succinate (TOPROL XL) 25 MG 24 hr tablet Take 0.5 tablets (12.5 mg total) by mouth daily. 07/28/22   Satira Sark, MD  nitroGLYCERIN (NITROSTAT) 0.4 MG SL tablet Place 1 tablet (0.4 mg total) under the tongue every 5 (five) minutes x 3 doses as needed for chest pain (if no relief after 3rd dose, proceed to the ED for an evaluation). 07/26/22   Evans Lance, MD  simvastatin (ZOCOR) 20 MG tablet Take 1 tablet by mouth once daily 07/14/22   Evans Lance, MD  Tiotropium Bromide-Olodaterol (STIOLTO RESPIMAT) 2.5-2.5 MCG/ACT AERS Inhale 2 puffs into the lungs daily. 05/12/22   Tanda Rockers, MD  vitamin B-12 (CYANOCOBALAMIN) 50 MCG tablet Take 50 mcg by mouth daily.    [provider]  VITAMIN D PO Take 1 tablet by mouth daily.    [provider]  XARELTO 20 MG TABS tablet TAKE 1 Tablet BY MOUTH ONCE EVERY DAY WITH SUPPER 06/19/22   Satira Sark, MD    Physical Exam    Vital Signs:  Richard [name] does not have vital signs available for review today.  Given telephonic nature of communication, physical exam is limited. AAOx3. NAD. Normal affect.  Speech and respirations are unlabored.  Accessory Clinical Findings    None  Assessment & Plan    1.  Preoperative Cardiovascular Risk Assessment: -   Mr. Wivell's perioperative risk of a major cardiac event is 0.4% according to the Revised Cardiac Risk Index (RCRI).  Therefore, he is at low risk for perioperative complications.   His functional capacity is fair at 5.07 METs according to the Duke Activity Status Index (DASI). Recommendations: According to ACC/AHA guidelines, no further cardiovascular testing needed.  The patient may proceed to surgery at  acceptable risk.   Antiplatelet and/or Anticoagulation Recommendations: Patient advised to hold Xarelto up to 3 days prior to scheduled extraction appointment.  -SBE prophylaxis not indicated for upcoming dental procedure  The patient was advised that if he develops new symptoms prior to surgery to contact our office to arrange for a follow-up visit, and he verbalized understanding.   A copy of this note will be routed to requesting surgeon.  Time:   Today, I have spent 6 minutes with the patient with telehealth technology discussing medical history, symptoms, and management plan.     Mable Fill, Marissa Nestle, NP  07/30/2022, 9:21 PM

## 2022-07-31 ENCOUNTER — Ambulatory Visit: Payer: Medicaid Other | Attending: Internal Medicine | Admitting: Nurse Practitioner

## 2022-07-31 DIAGNOSIS — Z01810 Encounter for preprocedural cardiovascular examination: Secondary | ICD-10-CM

## 2022-08-03 NOTE — Telephone Encounter (Signed)
Returned call back to pt's wife, Richard [name], [place] on file.  She has been made aware that it's up to the Dentist office on how many days they want pt to hold his Richard [name], but he has been ok'd from Cardiologist to hold up to 3 days if needed.

## 2022-08-03 NOTE — Telephone Encounter (Signed)
Pt spouse called in with some confusion about xarelto hold. Pt spouse states the dental office told her the wording of the clearance said pt needs to hold for 3 days.  Called dental office and they said pt spouse wants pt to only hold for 1 day like he's done in the past and they would need that faxed over again.

## 2022-08-04 ENCOUNTER — Telehealth: Payer: Self-pay | Admitting: Cardiology

## 2022-08-04 ENCOUNTER — Encounter: Payer: Self-pay | Admitting: Family Medicine

## 2022-08-04 ENCOUNTER — Ambulatory Visit (INDEPENDENT_AMBULATORY_CARE_PROVIDER_SITE_OTHER): Payer: Medicaid Other | Admitting: Family Medicine

## 2022-08-04 VITALS — BP 138/74 | HR 76 | Ht 71.0 in | Wt 175.1 lb

## 2022-08-04 DIAGNOSIS — I10 Essential (primary) hypertension: Secondary | ICD-10-CM | POA: Diagnosis not present

## 2022-08-04 DIAGNOSIS — E118 Type 2 diabetes mellitus with unspecified complications: Secondary | ICD-10-CM

## 2022-08-04 DIAGNOSIS — M79602 Pain in left arm: Secondary | ICD-10-CM | POA: Diagnosis not present

## 2022-08-04 DIAGNOSIS — E038 Other specified hypothyroidism: Secondary | ICD-10-CM

## 2022-08-04 DIAGNOSIS — E782 Mixed hyperlipidemia: Secondary | ICD-10-CM | POA: Diagnosis not present

## 2022-08-04 DIAGNOSIS — E559 Vitamin D deficiency, unspecified: Secondary | ICD-10-CM | POA: Diagnosis not present

## 2022-08-04 DIAGNOSIS — I480 Paroxysmal atrial fibrillation: Secondary | ICD-10-CM

## 2022-08-04 DIAGNOSIS — Z1159 Encounter for screening for other viral diseases: Secondary | ICD-10-CM | POA: Diagnosis not present

## 2022-08-04 DIAGNOSIS — Z114 Encounter for screening for human immunodeficiency virus [HIV]: Secondary | ICD-10-CM | POA: Diagnosis not present

## 2022-08-04 DIAGNOSIS — F17210 Nicotine dependence, cigarettes, uncomplicated: Secondary | ICD-10-CM

## 2022-08-04 DIAGNOSIS — I482 Chronic atrial fibrillation, unspecified: Secondary | ICD-10-CM

## 2022-08-04 NOTE — Progress Notes (Signed)
New Patient Office Visit  Subjective:  Patient ID: Richard [name], male    DOB: 1966-03-12  Age: 57 y.o. MRN: [id number]  CC:  Chief Complaint  Patient presents with   Establish Care    New patient. Pt was previously [facility] by [facility]. Pt reports vein stinging on his left arm his cardiologist recommended he have an ultrasound, states he wont be back to see them in a while he would like to discuss with you today.     HPI Richard [name] is a 57 y.o. male with past medical history of essential hypertension, TIA, GERD, COPD, pulmonary nodules, tendinitis of the right tricep, and hyperglycemia due to diabetes presents for establishing care. For the details of today's visit, please refer to the assessment and plan.     Past Medical History:  Diagnosis Date   Arthritis    Asthma    CAD (coronary artery disease)    a. Cardiac cath 07/2015 showed 65% distal Cx, 20% mid-distal LAD, 20% prox-distal RCA, EF 60%, EDP 18mHg.   Colitis 1990   COPD (chronic obstructive pulmonary disease) ([facility]    Depression    Essential hypertension    Gastric ulcer 2003; 2012   2003: + esophagitis; negative H.pylori serology  2012: [name] mild gastritis, Bravo PH probe placement, negative H.pylori   GERD (gastroesophageal reflux disease)    Hepatic steatosis    History of hiatal hernia    Hyperlipemia    Overweight    Panic attacks    Paroxysmal atrial fibrillation ([facility]    Stroke ([facility]    TIA (transient ischemic attack)    Type II diabetes mellitus ([facility]     Past Surgical History:  Procedure Laterality Date   BALLOON DILATION N/A 07/25/2021   Procedure: BALLOON DILATION;  Surgeon: CEloise Harman DO;  [facility];  Service: Endoscopy;  Laterality: N/A;   BIOPSY  07/25/2021   Procedure: BIOPSY;  Surgeon: CEloise Harman DO;  [facility];  Service: Endoscopy;;   BRAVO PTor[place]STUDY  05/03/2011   SNOB:SJGGgastritis/normal esophagus and duodenum   CARDIAC  CATHETERIZATION  1990s X 1; 2005; 08/12/2015   CARDIAC CATHETERIZATION N/A 08/12/2015   Procedure: Left Heart Cath and Coronary Angiography;  Surgeon: HBelva Crome MD; LAD 20%, CFX 65%, RCA 20%, EF 60%    COLONOSCOPY  1990   COLONOSCOPY WITH PROPOFOL N/A 11/21/2016   [name] non-thrombosed external hemorrhoids, one 6 mm polyp (polypoid lesion), internal hemorrhoids. TI Normal. 10 years screening   ESOPHAGOGASTRODUODENOSCOPY  05/03/2011   SEZM:OQHUgastritis   ESOPHAGOGASTRODUODENOSCOPY (EGD) WITH PROPOFOL N/A 07/25/2021   Procedure: ESOPHAGOGASTRODUODENOSCOPY (EGD) WITH PROPOFOL;  Surgeon: CEloise Harman DO;  [facility];  Service: Endoscopy;  Laterality: N/A;  1:30pm   NECK MASS EXCISION Right    "done in dr's office; behind right ear/side of ncek"   POLYPECTOMY  11/21/2016   Procedure: POLYPECTOMY;  Surgeon: FDanie Binder MD;  [facility];  Service: Endoscopy;;  descending colon polyp   SHOULDER ARTHROSCOPY W/ ROTATOR CUFF REPAIR Right 2006   acromioclavicular joint arthrosis    Family History  Problem Relation Age of Onset   Lung cancer Mother    Alcohol abuse Mother    Heart attack Father 523  Diabetes Father    Alcohol abuse Father    Hypertension Brother    Hypertension Brother    Anxiety disorder Sister    Depression Sister  Anxiety disorder Sister    Heart attack Brother 47   Diabetes Brother    Hypertension Brother    Seizures Brother    Dementia Paternal Uncle    Dementia Cousin    ADD / ADHD Daughter    Colon cancer Neg Hx    Drug abuse Neg Hx    Bipolar disorder Neg Hx    OCD Neg Hx    Paranoid behavior Neg Hx    Schizophrenia Neg Hx    Sexual abuse Neg Hx    Physical abuse Neg Hx     Social History   Socioeconomic History   Marital status: Married    Spouse name: Not on file   Number of children: Not on file   Years of education: Not on file   Highest education level: Not on file  Occupational History   Occupation:  full time    Employer: UNEMPLOYED  Tobacco Use   Smoking status: Every Day    Packs/day: 0.50    Years: 25.00    Total pack years: 12.50    Types: Cigarettes    Start date: 07/17/1982   Smokeless tobacco: Never   Tobacco comments:    1/2 pack a day  Vaping Use   Vaping Use: Never used  Substance and Sexual Activity   Alcohol use: No    Alcohol/week: 0.0 standard drinks of alcohol   Drug use: No   Sexual activity: Yes    Birth control/protection: None  Other Topics Concern   Not on file  Social History Narrative   Pt lives in [place] with wife.  5 children.  Unemployed due to panic attacks and back pain   Social Determinants of Health   Financial Resource Strain: Not on file  Food Insecurity: Not on file  Transportation Needs: Not on file  Physical Activity: Not on file  Stress: Not on file  Social Connections: Not on file  Intimate Partner Violence: Not on file    ROS Review of Systems  Constitutional:  Negative for fatigue and fever.  Eyes:  Negative for visual disturbance.  Respiratory:  Negative for chest tightness and shortness of breath.   Cardiovascular:  Negative for chest pain and palpitations.  Neurological:  Negative for dizziness and headaches.    Objective:   Today's Vitals: BP 138/74 (BP Location: Left Arm)   Pulse 76   Ht '5\' 11"'$  (1.803 m)   Wt 175 lb 1.9 oz (79.4 kg)   SpO2 94%   BMI 24.42 kg/m   Physical Exam HENT:     Head: Normocephalic.     Right Ear: External ear normal.     Left Ear: External ear normal.     Nose: No congestion or rhinorrhea.     Mouth/Throat:     Mouth: Mucous membranes are moist.  Cardiovascular:     Rate and Rhythm: Regular rhythm.     Heart sounds: No murmur heard. Pulmonary:     Effort: No respiratory distress.     Breath sounds: Normal breath sounds.  Skin:    General: Skin is warm.     Findings: No bruising, lesion or rash.     Comments: No pain, tenderness, induration, or erythema within the vein  of the left hand  Neurological:     Mental Status: He is alert.      Assessment & Plan:   Atrial fibrillation, chronic (HCC) Assessment & Plan: He takes dronedarone 400 mg twice daily, metoprolol 12.5 mg daily and  Xarelto 20 mg daily He reports not affording his medication dronedarone '400mg'$ , noting that he will be out of his medication on Tuesday, 08/08/2022 He reports that cardiology is aware Referral placed to pharmacy for medication management   Paroxysmal atrial fibrillation ([place]) -     AMB Referral to Pharmacy Medication Management  Cigarette smoker Assessment & Plan: Smokes about 1/2 pack/day  Asked about quitting: confirms that he currently smokes cigarettes Advise to quit smoking: Educated about QUITTING to reduce the risk of cancer, cardio and cerebrovascular disease. Assess willingness: Unwilling to quit at this time, but is working on cutting back. Assist with counseling and pharmacotherapy: Counseled for 5 minutes and literature provided. Arrange for follow up: follow up in 3 months and continue to offer help.    Arm pain, left Assessment & Plan: History of TIA,coronary arterial disease, and a-fib He is currently taking Xarelto 20 mg daily He reports a shooting, stabbing pain in the cephalic vein of the left arm that comes and goes Last episode was 2 days ago Will get a vascular u/s of the upper extremity to r/o blood clot and phlebitis   Orders: -     VAS [name] UPPER EXTREMITY ARTERIAL DUPLEX  Essential hypertension -     Microalbumin / creatinine urine ratio -     CMP14+EGFR -     CBC with Differential/Platelet  Controlled type 2 diabetes mellitus with complication, without long-term current use of insulin (HCC) -     Hemoglobin A1c  Mixed hyperlipidemia -     Lipid panel  Vitamin D deficiency -     VITAMIN D 25 Hydroxy (Vit-D Deficiency, Fractures)  Need for hepatitis C screening test -     Hepatitis C antibody  Encounter for screening for HIV -      HIV Antibody (routine testing w rflx)  Other specified hypothyroidism -     TSH + free T4     Follow-up: Return in about 3 months (around 11/03/2022).   Alvira Monday, FNP

## 2022-08-04 NOTE — Patient Instructions (Signed)
I appreciate the opportunity to provide care to you today!    Follow up:  3 months  Labs: please stop by the lab during the week to get your blood drawn (CBC, CMP, TSH, Lipid profile, HgA1c, Vit D)  Screening: HIV and Hep C  Smoking is harmful to your health and increases your risk for cancer, COPD, high blood pressure, cataracts, digestive problems, or health problems , such as gum disease, mouth sores, and tooth loss and loss of taste and smell. Smoking irritates your throat and causes coughing.   Referrals today- pharmacy for medication management   Please continue to a heart-healthy diet and increase your physical activities. Try to exercise for 27mns at least five times a week.      It was a pleasure to see you and I look forward to continuing to work together on your health and well-being. Please do not hesitate to call the office if you need care or have questions about your care.   Have a wonderful day and week. With Gratitude, GAlvira MondayMSN, FNP-BC

## 2022-08-04 NOTE — Assessment & Plan Note (Addendum)
He takes dronedarone 400 mg twice daily, metoprolol 12.5 mg daily and Xarelto 20 mg daily He reports not affording his medication dronedarone '400mg'$ , noting that he will be out of his medication on Tuesday, 08/08/2022 He reports that cardiology is aware Referral placed to pharmacy for medication management

## 2022-08-04 NOTE — Assessment & Plan Note (Signed)
Smokes about 1/2 pack/day  Asked about quitting: confirms that he currently smokes cigarettes Advise to quit smoking: Educated about QUITTING to reduce the risk of cancer, cardio and cerebrovascular disease. Assess willingness: Unwilling to quit at this time, but is working on cutting back. Assist with counseling and pharmacotherapy: Counseled for 5 minutes and literature provided. Arrange for follow up: follow up in 3 months and continue to offer help.

## 2022-08-04 NOTE — Telephone Encounter (Signed)
Yes, [facility] has Multaq samples. May come next week and pick up

## 2022-08-04 NOTE — Telephone Encounter (Addendum)
Spoke with Sunday Spillers at [facility] patient assistance. Pt was approved through October. Shipment of Multaq to be mailed to pt on Monday. Sunday Spillers states that it take up to 7 days for medication to reach pt.

## 2022-08-04 NOTE — Telephone Encounter (Signed)
Patient is now on medicaid and medicaid will notcover his multaq. He is asking for samples.

## 2022-08-04 NOTE — Assessment & Plan Note (Signed)
History of TIA,coronary arterial disease, and a-fib He is currently taking Xarelto 20 mg daily He reports a shooting, stabbing pain in the cephalic vein of the left arm that comes and goes Last episode was 2 days ago Will get a vascular u/s of the upper extremity to r/o blood clot and phlebitis

## 2022-08-07 ENCOUNTER — Telehealth: Payer: Self-pay | Admitting: Internal Medicine

## 2022-08-07 MED ORDER — MULTAQ 400 MG PO TABS: 400.0000 mg | ORAL_TABLET | Freq: Two times a day (BID) | ORAL | 0 refills | Status: DC

## 2022-08-07 NOTE — Telephone Encounter (Signed)
Spoke with wife who states that she spoke with the insurance company and was told that a prior auth was needed. She states that she will call the pharmacy and have it sent to the office.

## 2022-08-07 NOTE — Addendum Note (Signed)
Addended by: Rockne Menghini on: 08/07/2022 09:01 AM   Modules accepted: Orders

## 2022-08-07 NOTE — Telephone Encounter (Signed)
Pt c/o medication issue:  1. Name of Medication:   dronedarone (MULTAQ) 400 MG tablet    2. How are you currently taking this medication (dosage and times per day)? Take 1 tablet (400 mg total) by mouth 2 (two) times daily with a meal.   3. Are you having a reaction (difficulty breathing--STAT)? No  4. What is your medication issue? Pt's wife would like a callback regarding Prior Auth for medication. Please advise

## 2022-08-07 NOTE — Telephone Encounter (Signed)
Samples given to [facility] who took them to the [place] office for patient to pick up.

## 2022-08-14 ENCOUNTER — Telehealth: Payer: Self-pay | Admitting: Family Medicine

## 2022-08-14 ENCOUNTER — Telehealth: Payer: Self-pay | Admitting: Cardiology

## 2022-08-14 NOTE — Telephone Encounter (Signed)
Pt wife called stating that when she called to schedule the [name] they told her it needed to be worded differently for him to be able to have this done at [facility]

## 2022-08-14 NOTE — Telephone Encounter (Signed)
Wife stated a VAS [name] UPPER EXTREMITY ARTERIAL DUPLEX test was ordered for the patient but the order would not allow the patient to do this test at [facility].  Wife stated the patient's PCP is out of the office this week and would like to know if this test can be ordered by the patient's cardiologist to be performed in [place].

## 2022-08-14 NOTE — Telephone Encounter (Signed)
Patients wife notified and verbalized understanding and will wait for pcp to return to the office.

## 2022-08-15 ENCOUNTER — Telehealth: Payer: Self-pay | Admitting: *Deleted

## 2022-08-15 ENCOUNTER — Other Ambulatory Visit: Payer: Self-pay

## 2022-08-15 NOTE — Telephone Encounter (Signed)
Called and notified pt that his Multaq is in office. And available for pick up.

## 2022-08-15 NOTE — Telephone Encounter (Signed)
Spoke to wife stating the order was entered incorrectly, they are able to perform this for pt however the order has to be changed to US venous IMG upper left arm, I tried to put order in put couldn't find the correct one, please advice?

## 2022-08-17 ENCOUNTER — Other Ambulatory Visit: Payer: Self-pay | Admitting: Family Medicine

## 2022-08-17 DIAGNOSIS — M79602 Pain in left arm: Secondary | ICD-10-CM

## 2022-08-17 DIAGNOSIS — Z419 Encounter for procedure for purposes other than remedying health state, unspecified: Secondary | ICD-10-CM | POA: Diagnosis not present

## 2022-08-17 NOTE — Telephone Encounter (Signed)
Please inform the patient that I've ordered a stat upper extremity venous doppler of the left arm.

## 2022-08-17 NOTE — Telephone Encounter (Signed)
The order is placed

## 2022-08-18 ENCOUNTER — Telehealth (INDEPENDENT_AMBULATORY_CARE_PROVIDER_SITE_OTHER): Payer: Medicaid Other | Admitting: Psychiatry

## 2022-08-18 ENCOUNTER — Encounter (HOSPITAL_COMMUNITY): Payer: Self-pay | Admitting: Psychiatry

## 2022-08-18 DIAGNOSIS — F4001 Agoraphobia with panic disorder: Secondary | ICD-10-CM

## 2022-08-18 MED ORDER — ALPRAZOLAM 0.5 MG PO TABS: ORAL_TABLET | ORAL | 2 refills | Status: DC

## 2022-08-18 NOTE — Progress Notes (Signed)
Virtual Visit via Telephone Note  I connected with Richard [name] on 08/18/22 at 11:20 AM EST by telephone and verified that I am speaking with the correct person using two identifiers.  Location: Patient: home Provider: office   I discussed the limitations, risks, security and privacy concerns of performing an evaluation and management service by telephone and the availability of in person appointments. I also discussed with the patient that there may be a patient responsible charge related to this service. The patient expressed understanding and agreed to proceed.     I discussed the assessment and treatment plan with the patient. The patient was provided an opportunity to ask questions and all were answered. The patient agreed with the plan and demonstrated an understanding of the instructions.   The patient was advised to call back or seek an in-person evaluation if the symptoms worsen or if the condition fails to improve as anticipated.  I provided 20 minutes of non-face-to-face time during this encounter.   Richard Spiller, MD  [facility] MD/PA/NP OP Progress Note  08/18/2022 11:26 AM Richard [name]  MRN:  [id number]  Chief Complaint:  Chief Complaint  Patient presents with   Anxiety   Follow-up   HPI: This patient is [name] who lives with his wife in [place].  He is unemployed.  The patient returns after 2 months regarding his anxiety.  For the most part he is doing about the same.  He has been a little bit more jittery lately.  To his credit he has been trying to do some vertical push-ups and squats to get his body stronger.  He has been having some arm pain that he thinks is in the vein.  He is gotten a referral for a vein specialist.  He denies significant depression but the anxiety continues to be difficult although the Xanax helps a good deal.  He still has a lot of trouble being around a lot of people.  He denies any thoughts of suicide or self-harm Visit  Diagnosis:    ICD-10-CM   1. 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 Cardiac cath 07/2015 showed 65% distal Cx, 20% mid-distal LAD, 20% prox-distal RCA, EF 60%, EDP 60mHg.   Colitis 1990   COPD (chronic obstructive pulmonary disease) ([place]    Depression    Essential hypertension    Gastric ulcer 2003; 2012   2003: + esophagitis; negative H.pylori serology  2012: [name] mild gastritis, Bravo PH probe placement, negative H.pylori   GERD (gastroesophageal reflux disease)    Hepatic steatosis    History of hiatal hernia    Hyperlipemia    Overweight    Panic attacks    Paroxysmal atrial fibrillation ([place]    Stroke ([place]    TIA (transient ischemic attack)    Type II diabetes mellitus ([place]     Past Surgical History:  Procedure Laterality Date   BALLOON DILATION N/A 07/25/2021   Procedure: BALLOON DILATION;  Surgeon: CEloise Harman DO;  [facility];  Service: Endoscopy;  Laterality: N/A;   BIOPSY  07/25/2021   Procedure: BIOPSY;  Surgeon: CEloise Harman DO;  [facility];  Service: Endoscopy;;   B[place]VO [place]  05/03/2011  FAO:ZHYQ gastritis/normal esophagus and duodenum   CARDIAC CATHETERIZATION  1990s X 1; 2005; 08/12/2015   CARDIAC CATHETERIZATION N/A 08/12/2015   Procedure: Left Heart Cath and Coronary Angiography;  Surgeon: Belva Crome, MD; LAD 20%, CFX 65%, RCA 20%, EF 60%    COLONOSCOPY  1990   COLONOSCOPY WITH PROPOFOL N/A 11/21/2016   Dr. Oneida Alar: non-thrombosed external hemorrhoids, one 6 mm polyp (polypoid lesion), internal hemorrhoids. TI Normal. 10 years screening   ESOPHAGOGASTRODUODENOSCOPY  05/03/2011   MVH:QION gastritis   ESOPHAGOGASTRODUODENOSCOPY (EGD) WITH PROPOFOL N/A 07/25/2021   Procedure: ESOPHAGOGASTRODUODENOSCOPY (EGD) WITH PROPOFOL;   Surgeon: Eloise Harman, DO;  [facility];  Service: Endoscopy;  Laterality: N/A;  1:30pm   NECK MASS EXCISION Right    "done in dr's office; behind right ear/side of ncek"   POLYPECTOMY  11/21/2016   Procedure: POLYPECTOMY;  Surgeon: [name], MD;  Location[facility];  Service: Endoscopy;;  descending colon polyp   SHOULDER ARTHROSCOPY W/ ROTATOR CUFF REPAIR Right 2006   acromioclavicular joint arthrosis    Family Psychiatric History: See below  Family History:  Family History  Problem Relation Age of Onset   Lung cancer Mother    Alcohol abuse Mother    Heart attack Father 44   Diabetes Father    Alcohol abuse Father    Hypertension Brother    Hypertension Brother    Anxiety disorder Sister    Depression Sister    Anxiety disorder Sister    Heart attack Brother 70   Diabetes Brother    Hypertension Brother    Seizures Brother    Dementia Paternal Uncle    Dementia Cousin    ADD / ADHD Daughter    Colon cancer Neg Hx    Drug abuse Neg Hx    Bipolar disorder Neg Hx    OCD Neg Hx    Paranoid behavior Neg Hx    Schizophrenia Neg Hx    Sexual abuse Neg Hx    Physical abuse Neg Hx     Social History:  Social History   Socioeconomic History   Marital status: Married    Spouse name: Not on file   Number of children: Not on file   Years of education: Not on file   Highest education level: Not on file  Occupational History   Occupation: full time    Employer: UNEMPLOYED  Tobacco Use   Smoking status: Every Day    Packs/day: 0.50    Years: 25.00    Total pack years: 12.50    Types: Cigarettes    Start date: 07/17/1982   Smokeless tobacco: Never   Tobacco comments:    1/2 pack a day  Vaping Use   Vaping Use: Never used  Substance and Sexual Activity   Alcohol use: No    Alcohol/week: 0.0 standard drinks of alcohol   Drug use: No   Sexual activity: Yes    Birth control/protection: None  Other Topics Concern   Not on file  Social  History Narrative   Pt lives in [place] with wife.  5 children.  Unemployed due to panic attacks and back pain   Social Determinants of Health   Financial Resource Strain: Not on file  Food Insecurity: Not on file  Transportation Needs: Not on file  Physical Activity: Not on file  Stress: Not on file  Social Connections: Not on file    Allergies:  Allergies  Allergen Reactions   Dexilant [Dexlansoprazole]  Anaphylaxis   Mushroom Ext Cmplx(Shiitake-Reishi-Mait) Anaphylaxis    Rapid heart rate.   Penicillins Anaphylaxis    Has patient had a PCN reaction causing immediate rash, facial/tongue/throat swelling, SOB or lightheadedness with hypotension: Yes Has patient had a PCN reaction causing severe rash involving mucus membranes or skin necrosis: No Has patient had a PCN reaction that required hospitalization Yes Has patient had a PCN reaction occurring within the last 10 years: No If all of the above answers are "NO", then may proceed with Cephalosporin use.    Doxycycline Nausea And Vomiting         Metabolic Disorder Labs: Lab Results  Component Value Date   HGBA1C 5.5 02/16/2022   MPG 111.15 02/16/2022   MPG 114.02 11/09/2021   No results found for: "PROLACTIN" Lab Results  Component Value Date   CHOL 124 06/13/2022   TRIG 38 06/13/2022   HDL 43 06/13/2022   CHOLHDL 2.9 06/13/2022   VLDL 8 06/13/2022   LDLCALC 73 06/13/2022   LDLCALC 61 02/16/2022   Lab Results  Component Value Date   TSH 1.876 11/09/2021   TSH 1.230 07/20/2021    Therapeutic Level Labs: No results found for: "LITHIUM" No results found for: "VALPROATE" No results found for: "CBMZ"  Current Medications: Current Outpatient Medications  Medication Sig Dispense Refill   acetaminophen (TYLENOL) 325 MG tablet Take 650 mg by mouth every 6 (six) hours as needed.     albuterol (VENTOLIN HFA) 108 (90 Base) MCG/ACT inhaler INHALE 2 PUFFS BY MOUTH EVERY 6 HOURS AS NEEDED FOR COUGHING,  WHEEZING, OR SHORTNESS OF BREATH 20.1 g 0   ALPRAZolam (XANAX) 0.5 MG tablet Take one twice a day and two at bedtime 120 tablet 2   amLODipine (NORVASC) 10 MG tablet TAKE 1 Tablet BY MOUTH ONCE DAILY 90 tablet 0   dronedarone (MULTAQ) 400 MG tablet Take 1 tablet (400 mg total) by mouth 2 (two) times daily with a meal. 180 tablet 3   dronedarone (MULTAQ) 400 MG tablet Take 1 tablet (400 mg total) by mouth 2 (two) times daily with a meal. 60 tablet 0   esomeprazole (NEXIUM) 40 MG capsule Take 1 capsule (40 mg total) by mouth 2 (two) times daily before a meal. 60 capsule 11   famotidine (PEPCID) 20 MG tablet Take 20 mg by mouth at bedtime.     metoprolol succinate (TOPROL XL) 25 MG 24 hr tablet Take 0.5 tablets (12.5 mg total) by mouth daily. 45 tablet 3   nitroGLYCERIN (NITROSTAT) 0.4 MG SL tablet Place 1 tablet (0.4 mg total) under the tongue every 5 (five) minutes x 3 doses as needed for chest pain (if no relief after 3rd dose, proceed to the ED for an evaluation). 25 tablet 3   simvastatin (ZOCOR) 20 MG tablet Take 1 tablet by mouth once daily 90 tablet 3   vitamin B-12 (CYANOCOBALAMIN) 50 MCG tablet Take 50 mcg by mouth daily.     VITAMIN D PO Take 1 tablet by mouth daily.     XARELTO 20 MG TABS tablet TAKE 1 Tablet BY MOUTH ONCE EVERY DAY WITH SUPPER 90 tablet 1   No current facility-administered medications for this visit.     Musculoskeletal: Strength & Muscle Tone: na Gait & Station: na Patient leans: N/A  Psychiatric Specialty Exam: Review of Systems  Respiratory:  Positive for shortness of breath.   Musculoskeletal:  Positive for arthralgias.  Psychiatric/Behavioral:  The patient is nervous/anxious.   All other systems reviewed  and are negative.   There were no vitals taken for this visit.There is no height or weight on file to calculate BMI.  General Appearance: NA  Eye Contact:  NA  Speech:  clear  Volume:  Normal  Mood:  Anxious  Affect:  NA  Thought Process:  Goal  Directed  Orientation:  Full (Time, Place, and Person)  Thought Content: Rumination   Suicidal Thoughts:  No  Homicidal Thoughts:  No  Memory:  Immediate;   Good Recent;   Good Remote;   Good  Judgement:  Good  Insight:  Fair  Psychomotor Activity:  Decreased  Concentration:  Concentration: Good and Attention Span: Good  Recall:  Good  Fund of Knowledge: Good  Language: Good  Akathisia:  No  Handed:  Right  AIMS (if indicated): not done  Assets:  Communication Skills Desire for Improvement Resilience Social Support  ADL's:  Intact  Cognition: WNL  Sleep:  Fair   Screenings: GAD-7    Flowsheet Row Office Visit from 08/04/2022 in [place] from 08/24/2021 in [place] at [place]  Total GAD-7 Score 8 20      PHQ2-9    [place] Office Visit from 08/04/2022 in [facility] Video Visit from 11/29/2021 in [place] at [place] Video Visit from 09/06/2021 in [place] at [place] from 08/24/2021 in [place] at [place] Video Visit from 06/16/2021 in [place] at [facility] Total Score '4 1 5 4 1  '$ PHQ-9 Total Score 12 -- 12 16 --      Flowsheet Row Video Visit from 11/29/2021 in [place] at [place] [facility] from 09/27/2021 in [facility] at [place] Video Visit from 09/06/2021 in [place] at [place] No Risk No Risk No Risk        Assessment and Plan: This patient is a 57 year old male with a history of anxiety and panic attacks.  He does feel that the anxiety has helped by the Xanax so we will continue 0.5 mg twice daily and 1 mg at bedtime.  He will return to see me in 3 months  Collaboration of Care: Collaboration of Care: Primary Care Provider  AEB notes are shared with PCP on the epic system  Patient/Guardian was advised Release of Information must be obtained prior to any record release in order to collaborate their care with an outside provider. Patient/Guardian was advised if they have not already done so to contact the registration department to sign all necessary forms in order for [name] to release information regarding their care.   Consent: Patient/Guardian gives verbal consent for treatment and assignment of benefits for services provided during this visit. Patient/Guardian expressed understanding and agreed to proceed.    Richard Spiller, MD 08/18/2022, 11:26 AM

## 2022-09-01 ENCOUNTER — Telehealth: Payer: Self-pay | Admitting: Internal Medicine

## 2022-09-01 NOTE — Telephone Encounter (Signed)
Pt c/o medication issue:  1. Name of Medication: dronedarone (MULTAQ) 400 MG tablet   2. How are you currently taking this medication (dosage and times per day)? As prescribed  3. Are you having a reaction (difficulty breathing--STAT)? No   4. What is your medication issue? Insurance has denied medication, so they are requesting PA be submitted. Please advise.

## 2022-09-01 NOTE — Telephone Encounter (Signed)
Patient notified that PA for Multaq was submitted on 08/18/2022 and denied due to insurance wanting pt to try/fail 2 preferred drugs prior to approving Multaq. Patient verbalized that he will continue to use patient assistance for Multaq.

## 2022-09-08 ENCOUNTER — Ambulatory Visit (INDEPENDENT_AMBULATORY_CARE_PROVIDER_SITE_OTHER): Payer: Medicaid Other | Admitting: Clinical

## 2022-09-08 DIAGNOSIS — F331 Major depressive disorder, recurrent, moderate: Secondary | ICD-10-CM

## 2022-09-08 DIAGNOSIS — F4001 Agoraphobia with panic disorder: Secondary | ICD-10-CM | POA: Diagnosis not present

## 2022-09-08 DIAGNOSIS — F4310 Post-traumatic stress disorder, unspecified: Secondary | ICD-10-CM | POA: Diagnosis not present

## 2022-09-08 DIAGNOSIS — F419 Anxiety disorder, unspecified: Secondary | ICD-10-CM

## 2022-09-08 NOTE — Progress Notes (Signed)
Virtual Visit via Video Note  I connected with [place] on 09/08/22 at 11:00 AM EST by a video enabled telemedicine application and verified that I am speaking with the correct person using two identifiers.  Location: Patient: Home Provider: Office   I discussed the limitations of evaluation and management by telemedicine and the availability of in person appointments. The patient expressed understanding and agreed to proceed.  THERAPIST PROGRESS NOTE     Session Time: 11:00 AM-11:45 PM   Participation Level: Active   Behavioral Response: Casual and Alert,Anxious   Type of Therapy: Individual Therapy   Treatment Goals addressed: Mood and Anxiety   Interventions: CBT   Summary: Marce L. Buchholtz is a 57 y.o. male who presents with panic disorder/depression with anxiety/and PTSD . The OPT therapist worked with the patient for his OPT treatment. The OPT therapist utilized Motivational Interviewing to assist in creating therapeutic repore. The patient in the session was engaged and work in collaboration giving feedback about his triggers and symptoms over the past few weeks The patient spoke about his ongoing stress from finances and not being able to work due to physical health problems. The patient noted he did get recently approved for SSI. The patient additionally spoke about recent dental surgery and recovering from the surgery over the past week with limits on what he can eat post extraction of all upper teeth. The OPT therapist utilized Cognitive Behavioral Therapy through cognitive restructuring as well as worked with the patient on coping strategies to assist in management of symptoms as well as reviewed sleep, eating habits, and general health. The patient continues to work on acceptance of limited mobility based on health conditions.The OPT therapist overviewed upcoming health appointments as listed in the patients MyChart.   Suicidal/Homicidal: Nowithout intent/plan    Therapist Response:The OPT therapist worked with the patient for the patients scheduled session. The patient was engaged in his session and gave feedback in relation to triggers, symptoms, and behavior responses over the past few weeks. The OPT therapist worked with the patient utilizing an in session Cognitive Behavioral Therapy exercise. The patient was responsive in the session and verbalized, " I did get some good news and was approved for my SSI I have not been getting out of the house much over the past week I have been recovering from oral surgery I had to get all of my remaining upper teeth removed and I have only been able to get soft foods".The OPT therapist worked with the patient in this session providing support and reviewing with the patient grieving and placing emphasis on the patient working to remain active while using his judgement with the approval of his physican as the patient has cardiac health condition and utilizing his support symptom. The OPT therapist worked with the patient on coping strategies to assist in management of his mood and anxiety.The OPT therapist will continue treatment work with the patient in his  next scheduled session   Plan: Return again in 2/3 weeks.   Diagnosis:      Axis I: PTSD/Panic Disorder/ Depression  Axis II: No diagnosis       Collaboration of Care: No additional collaboration of care for this session.   Patient/Guardian was advised Release of Information must be obtained prior to any record release in order to collaborate their care with an outside provider. Patient/Guardian was advised if they have not already done so to contact the registration department to sign all necessary forms in order for  [name] to release information regarding their care.    Consent: Patient/Guardian gives verbal consent for treatment and assignment of benefits for services provided during this visit. Patient/Guardian expressed understanding and agreed to proceed.    I  discussed the assessment and treatment plan with the patient. The patient was provided an opportunity to ask questions and all were answered. The patient agreed with the plan and demonstrated an understanding of the instructions.   The patient was advised to call back or seek an in-person evaluation if the symptoms worsen or if the condition fails to improve as anticipated.   I provided 45 minutes of non-face-to-face time during this encounter.   [name], LCSW   09/08/2022

## 2022-09-13 ENCOUNTER — Ambulatory Visit: Payer: Self-pay | Admitting: Physician Assistant

## 2022-09-13 ENCOUNTER — Ambulatory Visit (INDEPENDENT_AMBULATORY_CARE_PROVIDER_SITE_OTHER): Payer: Medicaid Other | Admitting: Orthopaedic Surgery

## 2022-09-13 ENCOUNTER — Ambulatory Visit (INDEPENDENT_AMBULATORY_CARE_PROVIDER_SITE_OTHER): Payer: Medicaid Other

## 2022-09-13 ENCOUNTER — Encounter: Payer: Self-pay | Admitting: Orthopaedic Surgery

## 2022-09-13 VITALS — BP 116/66 | HR 61 | Ht 71.0 in | Wt 174.0 lb

## 2022-09-13 DIAGNOSIS — G8929 Other chronic pain: Secondary | ICD-10-CM | POA: Diagnosis not present

## 2022-09-13 DIAGNOSIS — M25561 Pain in right knee: Secondary | ICD-10-CM

## 2022-09-13 NOTE — Progress Notes (Signed)
My knee hurts.  He has chronic pain of the right knee that is getting worse.  He has lateral pain.  He has been awakened by the pain that he says is intense.  He has no new trauma, no giving way, no redness.  It swells and pops.  He cannot take NSAIDs as he is on Xerelto.  Right knee has lateral tenderness, slight effusion, crepitus, ROM 0 to 110, positive lateral [place], NV intact, no distal edema.  X-rays were done of the right knee, reported separately.  Encounter Diagnosis  Name Primary?   Chronic pain of right knee Yes   He will most likely need MRI.  He declines injection as he had reaction from a steroid injection in the past and had to go to the ER.  Return in two weeks.  Use Aspercreme, Biofreeze or Voltaren Gel.  Call if any problem.  Precautions discussed.  Electronically Signed Sanjuana Kava, MD 2/28/202410:42 AM

## 2022-09-13 NOTE — Patient Instructions (Signed)
Aspercreme, Biofreeze, Blue Emu or Voltaren Gel over the counter 2-3 times daily. Rub into area well each use for best results.

## 2022-09-14 ENCOUNTER — Telehealth: Payer: Self-pay | Admitting: Cardiology

## 2022-09-14 ENCOUNTER — Other Ambulatory Visit: Payer: Self-pay | Admitting: Cardiology

## 2022-09-14 ENCOUNTER — Encounter: Payer: Self-pay | Admitting: Radiology

## 2022-09-14 MED ORDER — AMLODIPINE BESYLATE 10 MG PO TABS: 10.0000 mg | ORAL_TABLET | Freq: Every day | ORAL | 0 refills | Status: DC

## 2022-09-14 NOTE — Telephone Encounter (Signed)
Prescription refill request for Xarelto received.   Indication: afib  Last office visit: Barbarann Ehlers 07/31/2022 Weight: 78.9 kg  Age: 57 yo  Scr: 0.82, 06/13/2022 CrCl: 125m/min   Refill sent.

## 2022-09-14 NOTE — Telephone Encounter (Signed)
*  STAT* If patient is at the pharmacy, call can be transferred to refill team.   1. Which medications need to be refilled? (please list name of each medication and dose if known)   amLODipine (NORVASC) 10 MG tablet    2. Which pharmacy/location (including street and city if local pharmacy) is medication to be sent to?  [place], [place] [id number]     3. Do they need a 30 day or 90 day supply? 90 day

## 2022-09-15 DIAGNOSIS — Z419 Encounter for procedure for purposes other than remedying health state, unspecified: Secondary | ICD-10-CM | POA: Diagnosis not present

## 2022-09-19 ENCOUNTER — Telehealth: Payer: Self-pay | Admitting: Cardiology

## 2022-09-19 NOTE — Telephone Encounter (Signed)
Spoke with Richard [name] from requesting office and she states that patient is having 1 tooth extracted and a deep cleaning

## 2022-09-19 NOTE — Telephone Encounter (Signed)
   Pre-operative Risk Assessment    Patient Name: Richard [name]  DOB: 1965/12/10 MRN: [id number]     Request for Surgical Clearance    Procedure:  Dental Extraction - Amount of Teeth to be Pulled:  Not indicated  Date of Surgery:  Clearance 09/25/22                                 Surgeon:  Erik Obey, DDS, MHA Surgeon's Group or Practice Name:  [facility] Phone number:  [phone] Fax number:  [phone]   Type of Clearance Requested:   - Medical  - Pharmacy:  Hold    If applicable   Type of Anesthesia:  Local    Additional requests/questions:   Please advise for planned treatment considering patient history, meds and stroke.  Signed, Hipolito Bayley   09/19/2022, 1:58 PM

## 2022-09-19 NOTE — Telephone Encounter (Signed)
    Primary Cardiologist: Rozann Lesches, MD  Chart reviewed as part of pre-operative protocol coverage. Simple dental extractions are considered low risk procedures per guidelines and generally do not require any specific cardiac clearance. It is also generally accepted that for simple extractions and dental cleanings, there is no need to interrupt blood thinner therapy.   SBE prophylaxis is not required for the patient.  I will route this recommendation to the requesting party via Epic fax function and remove from pre-op pool.  Please call with questions.  Deberah Pelton, NP 09/19/2022, 3:52 PM

## 2022-09-19 NOTE — Telephone Encounter (Signed)
Preoperative team, patient surgery request indicates dental extraction.  Amount of teeth to be extracted is not noted.  Please contact the requesting office and inquire about number of teeth to be extracted.  Once details are provided we will be able to make recommendations.  Thank you for your help.  Jossie Ng. Dontavious Emily NP-C     09/19/2022, 3:22 PM [facility] [place] [address] Office [phone] Fax [phone]

## 2022-09-20 IMAGING — CT CT CTA ABD/PEL W/CM AND/OR W/O CM
3 of 9 series · 11 of 46 positions shown, 17 images · IV contrast (Omnipaque or Isovue)
Comparison: 04/07/2021

CLINICAL DATA: 55-year-old male with a history of postprandial
abdominal pain and weight loss

EXAM:
CTA ABDOMEN AND PELVIS WITHOUT AND WITH CONTRAST
TECHNIQUE: Multidetector CT imaging of the abdomen and pelvis was performed
using the standard protocol during bolus administration of
intravenous contrast. Multiplanar reconstructed images and MIPs were
obtained and reviewed to evaluate the vascular anatomy.

[Series 4: arterial · axial · arterial · 0.85mm/px · z∈[+1512,+1682]mm · 4 of 256 slices shown]
[im 29/256  soft-tissue]
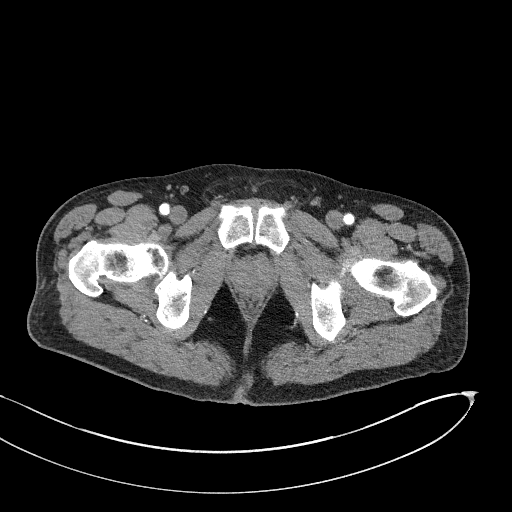
[im 57/256  soft-tissue]
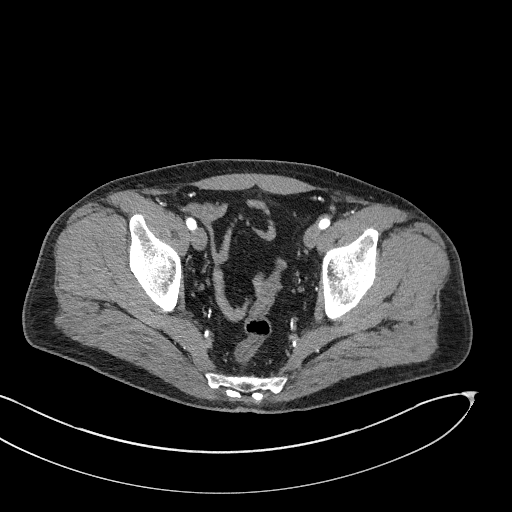
[im 86/256  soft-tissue]
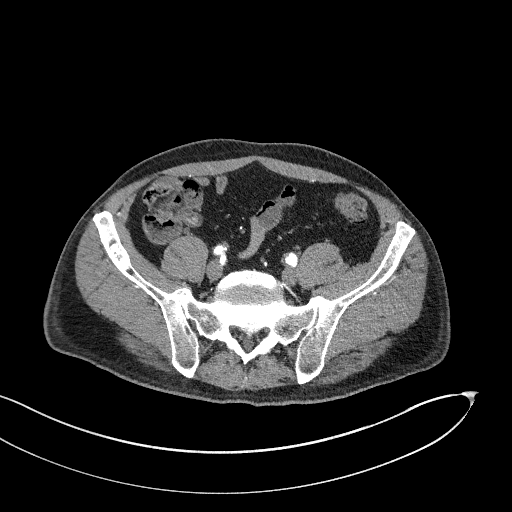
[im 114/256  soft-tissue]
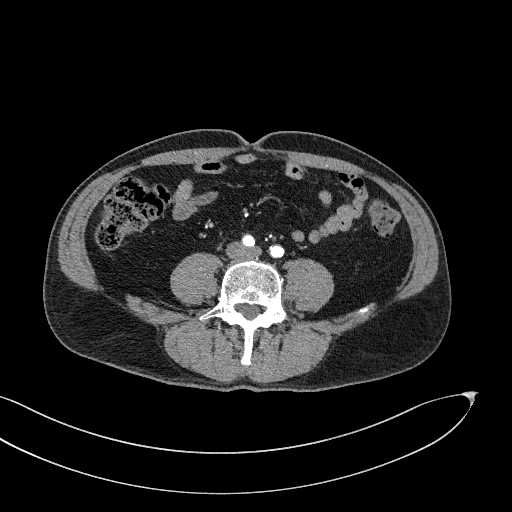

[Series 7: cor art · coronal · 0.87mm/px · 2 of 158 slices shown, 3 images]
[im 53/158  soft-tissue]
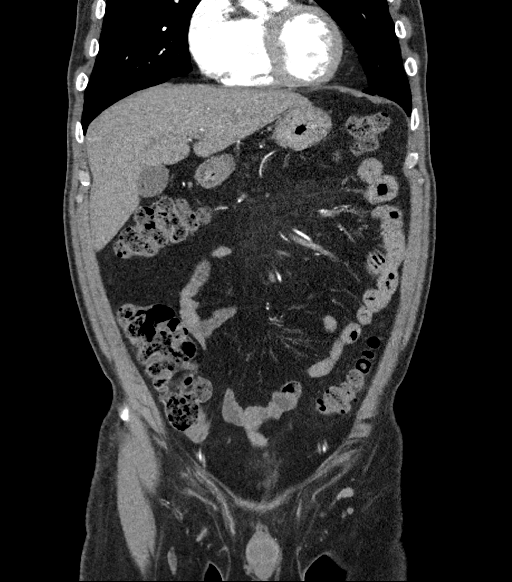
[im 53/158  bone]
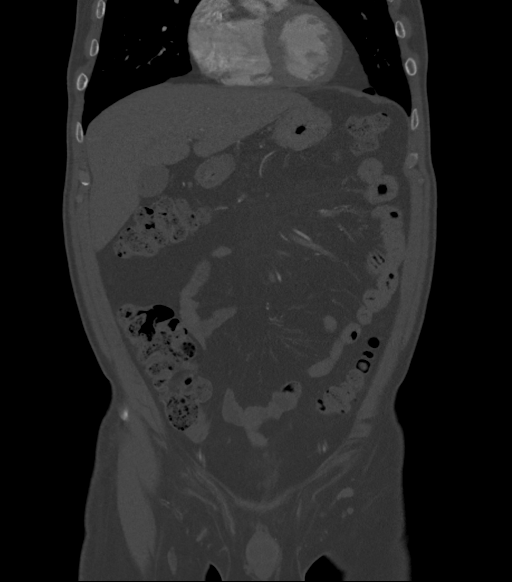
[im 105/158  soft-tissue]
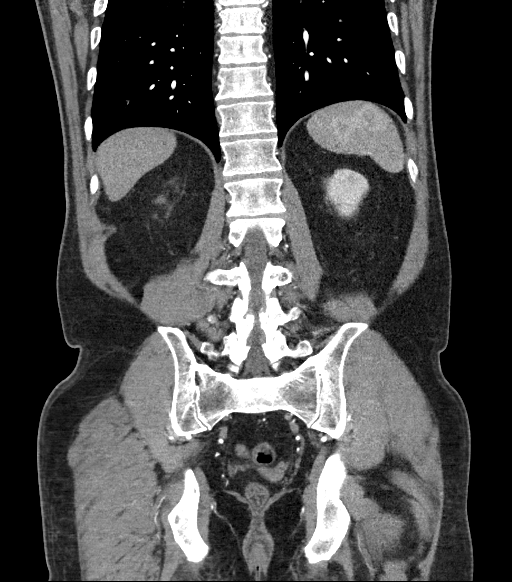

[Series 11: portal venous · axial · portal-venous · 0.83mm/px · z∈[+1540,+1880]mm · 5 of 102 slices shown, 10 images]
[im 17/102  soft-tissue]
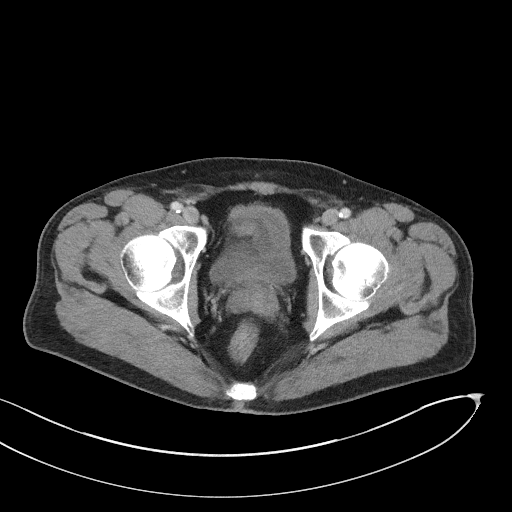
[im 17/102  bone]
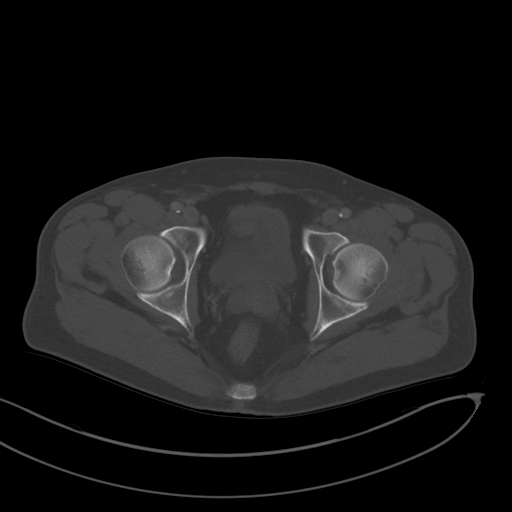
[im 34/102  soft-tissue]
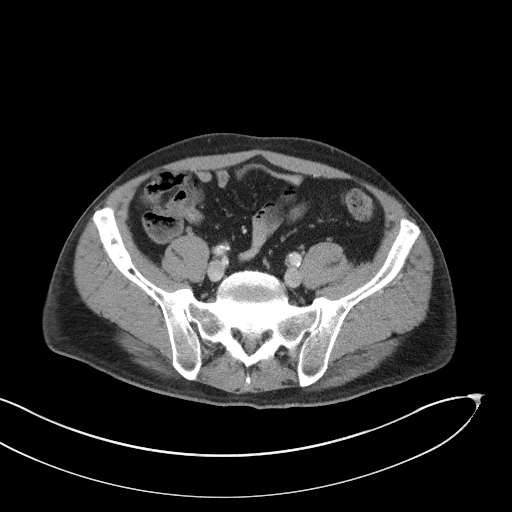
[im 34/102  lung]
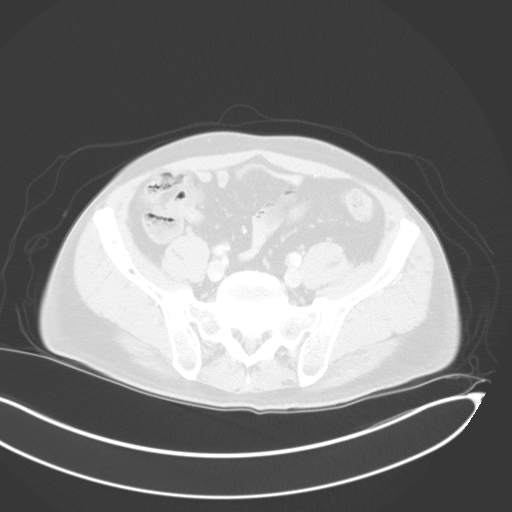
[im 51/102  soft-tissue]
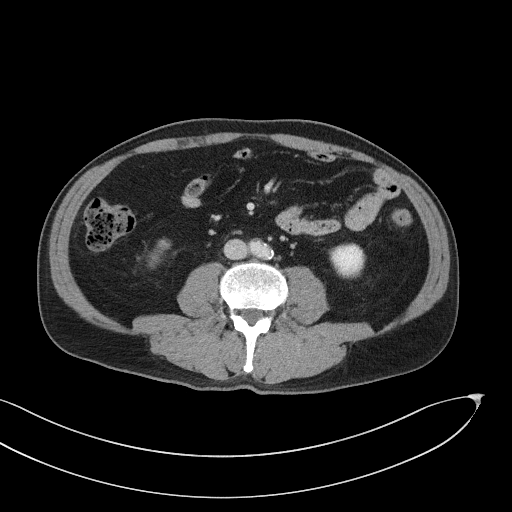
[im 51/102  lung]
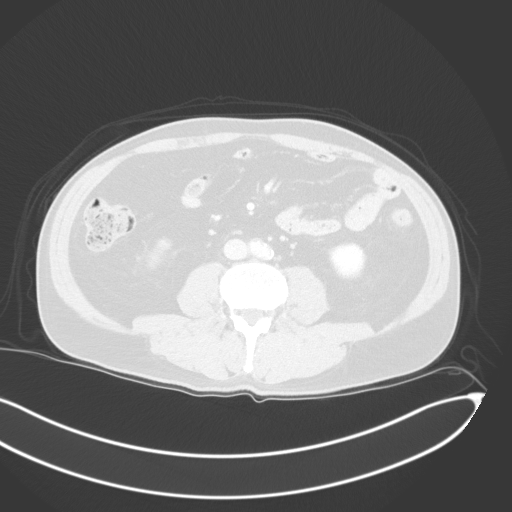
[im 68/102  soft-tissue]
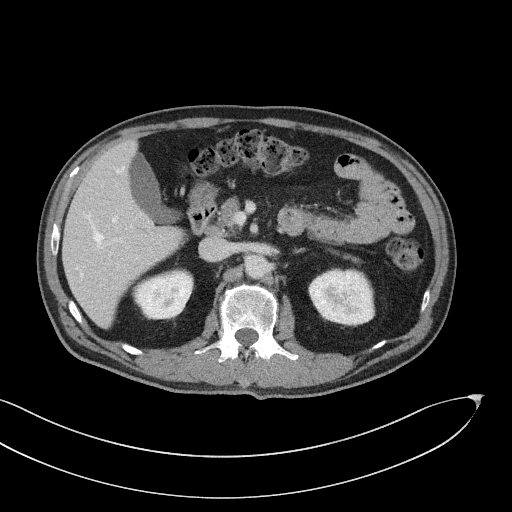
[im 68/102  lung]
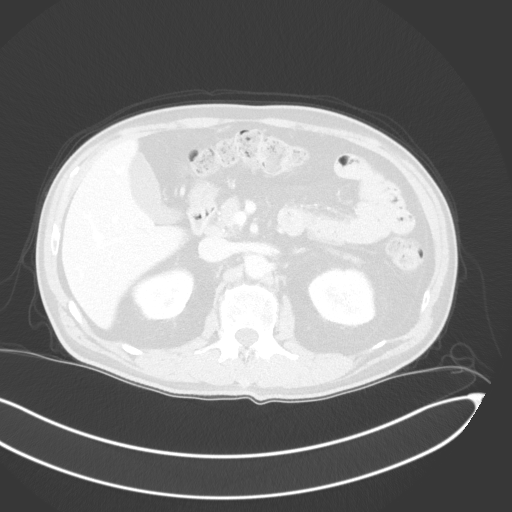
[im 85/102  soft-tissue]
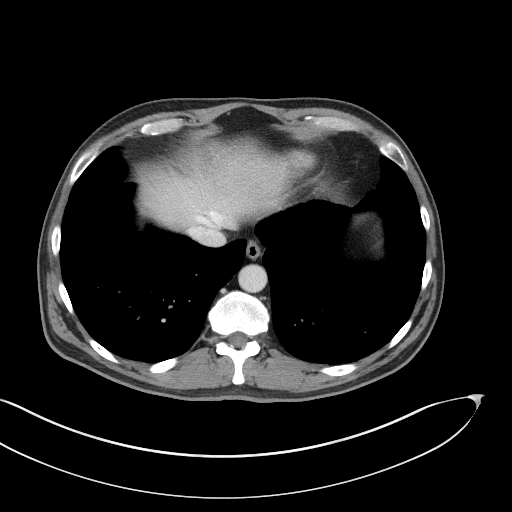
[im 85/102  lung]
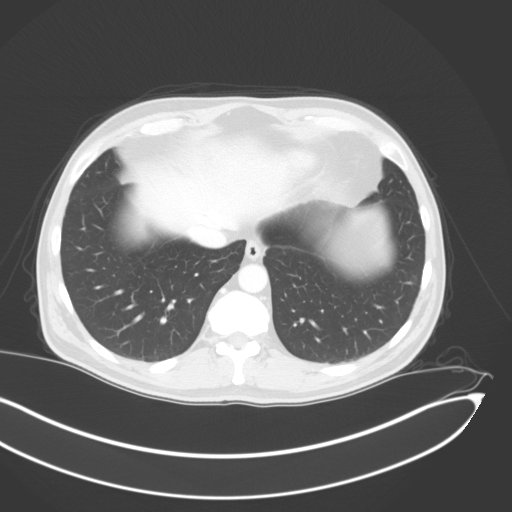

[11 of 46 positions shown; findings below may reference images not displayed]

RADIATION DOSE REDUCTION: This exam was performed according to the
departmental dose-optimization program which includes automated
exposure control, adjustment of the mA and/or kV according to
patient size and/or use of iterative reconstruction technique.

CONTRAST:  100mL OMNIPAQUE IOHEXOL 350 MG/ML SOLN
FINDINGS: VASCULAR

Aorta: Unremarkable course, caliber, contour of the abdominal aorta.
No dissection, aneurysm, or periaortic fluid. Mild atherosclerotic
changes with mixed calcified and soft plaque of the infrarenal
abdominal aorta with no narrowing.

Celiac: Patent, with no significant atherosclerotic changes.

SMA: Patent, with no significant atherosclerotic changes.

Renals:

- Right: Right renal artery patent.

- Left: Left renal artery patent.

IMA: Inferior mesenteric artery is patent.

Right lower extremity:

Unremarkable course, caliber, and contour of the right iliac system.
Moderate atherosclerosis of the common iliac artery. Ectasias a with
diameter of 15 mm. Chronic dissection/ulcerated plaque of the common
iliac artery without evidence of significant narrowing. Hypogastric
artery and the pelvic arteries are patent. No significant tortuosity
of the external iliac artery. No significant atherosclerosis.

Common femoral artery with minimal atherosclerosis. Proximal
profunda femoris and SFA patent.

Left lower extremity:

Unremarkable course, caliber, and contour of the left iliac system.
Mild atherosclerotic changes of the common iliac artery. Ectasia
measuring 15 mm. No significant stenosis. Hypogastric artery is
patent as well as the pelvic arteries.

External iliac artery of normal course caliber and contour with no
significant atherosclerosis.

Common femoral artery patent with minimal atherosclerosis. Proximal
profunda femoris and SFA are patent.

Veins: Unremarkable appearance of the venous system.

Review of the MIP images confirms the above findings.

NON-VASCULAR

Lower chest: No acute.

Hepatobiliary: Unremarkable appearance of the liver. Unremarkable
gall bladder.

Pancreas: Unremarkable.

Spleen: Unremarkable.

Adrenals/Urinary Tract:

- Right adrenal gland: Unremarkable

- Left adrenal gland: Unremarkable.

- Right kidney: No hydronephrosis, nephrolithiasis, inflammation, or
ureteral dilation. No focal lesion.

- Left Kidney: No hydronephrosis, nephrolithiasis, inflammation, or
ureteral dilation. No focal lesion.

- Urinary Bladder: Unremarkable.

Stomach/Bowel:

- Stomach: Unremarkable.

- Small bowel: Unremarkable

- Appendix: Normal.

- Colon: Mild diverticular disease of the left colon. No
inflammatory changes. No obstructive changes.

Lymphatic: No adenopathy.

Mesenteric: No free fluid or air. No mesenteric adenopathy.

Reproductive: Diameter of the prostate measures 36 mm

Other: Bilateral fat containing inguinal hernia

Musculoskeletal: No evidence of acute fracture. No bony canal
narrowing. No significant degenerative changes of the hips.
IMPRESSION: The CT angiogram does not support chronic mesenteric ischemia as a
source of the patient's reported symptoms.

Aortic atherosclerosis with mild iliac arterial disease. Aortic
Atherosclerosis ([id number]).

Bilateral common iliac artery ectasia, 15 mm bilaterally.

Additional ancillary findings as above.

## 2022-09-27 ENCOUNTER — Encounter: Payer: Self-pay | Admitting: Orthopaedic Surgery

## 2022-09-27 ENCOUNTER — Ambulatory Visit (INDEPENDENT_AMBULATORY_CARE_PROVIDER_SITE_OTHER): Payer: Medicaid Other | Admitting: Orthopaedic Surgery

## 2022-09-27 VITALS — BP 118/58 | HR 56 | Ht 71.0 in | Wt 175.8 lb

## 2022-09-27 DIAGNOSIS — G8929 Other chronic pain: Secondary | ICD-10-CM | POA: Diagnosis not present

## 2022-09-27 DIAGNOSIS — M25561 Pain in right knee: Secondary | ICD-10-CM | POA: Diagnosis not present

## 2022-09-27 DIAGNOSIS — Z9229 Personal history of other drug therapy: Secondary | ICD-10-CM

## 2022-09-27 DIAGNOSIS — F17210 Nicotine dependence, cigarettes, uncomplicated: Secondary | ICD-10-CM | POA: Diagnosis not present

## 2022-09-27 NOTE — Progress Notes (Signed)
My knee still hurts.  He has pain in the right knee, swelling and giving way.  He has no new trauma.  Right knee has effusion, crepitus, ROM 0 to 110, medial pain, positive medial McMurray, limp right.  NV intact.  No distal edema.  Encounter Diagnoses  Name Primary?   Chronic pain of right knee Yes   Nicotine dependence, cigarettes, uncomplicated    HX: anticoagulation    Return in one month.  Consider MRI of the right knee.  Call if any problem.  Precautions discussed.  Electronically Signed Sanjuana Kava, MD 3/13/20249:49 AM

## 2022-10-06 ENCOUNTER — Ambulatory Visit (HOSPITAL_COMMUNITY): Payer: Medicaid Other | Admitting: Clinical

## 2022-10-10 ENCOUNTER — Ambulatory Visit (INDEPENDENT_AMBULATORY_CARE_PROVIDER_SITE_OTHER): Payer: Medicaid Other | Admitting: Clinical

## 2022-10-10 DIAGNOSIS — F419 Anxiety disorder, unspecified: Secondary | ICD-10-CM | POA: Diagnosis not present

## 2022-10-10 DIAGNOSIS — F4001 Agoraphobia with panic disorder: Secondary | ICD-10-CM | POA: Diagnosis not present

## 2022-10-10 DIAGNOSIS — F4310 Post-traumatic stress disorder, unspecified: Secondary | ICD-10-CM | POA: Diagnosis not present

## 2022-10-10 DIAGNOSIS — F331 Major depressive disorder, recurrent, moderate: Secondary | ICD-10-CM | POA: Diagnosis not present

## 2022-10-10 NOTE — Progress Notes (Signed)
Virtual Visit via Video Note   I connected with [facility] on 10/10/22 at 1:00 PM EST by a video enabled telemedicine application and verified that I am speaking with the correct person using two identifiers.   Location: Patient: Home Provider: Office   I discussed the limitations of evaluation and management by telemedicine and the availability of in person appointments. The patient expressed understanding and agreed to proceed.   THERAPIST PROGRESS NOTE     Session Time: 1:00 PM-1:45 PM   Participation Level: Active   Behavioral Response: Casual and Alert,Anxious   Type of Therapy: Individual Therapy   Treatment Goals addressed: Mood and Anxiety   Interventions: CBT   Summary: Richard [name] is a 57 y.o. male who presents with panic disorder/depression with anxiety/and PTSD . The OPT therapist worked with the patient for his OPT treatment. The OPT therapist utilized Motivational Interviewing to assist in creating therapeutic repore. The patient in the session was engaged and work in collaboration giving feedback about his triggers and symptoms over the past few weeks The patient spoke about improvement with financial stress from recently get recently approved for SSI. The patient additionally spoke about ongoing recovery from dental surgery with  restrictions on what he can eat post extraction of all upper teeth. The OPT therapist utilized Cognitive Behavioral Therapy through cognitive restructuring as well as worked with the patient on coping strategies to assist in management of symptoms as well as reviewed sleep, eating habits, and general health. The patient continues to work on acceptance of limited mobility based on health conditions.The patients poke about his ongoing work with smoking cessation.The OPT therapist overviewed upcoming health appointments as listed in the patients MyChart.   Suicidal/Homicidal: Nowithout intent/plan   Therapist Response:The OPT therapist  worked with the patient for the patients scheduled session. The patient was engaged in his session and gave feedback in relation to triggers, symptoms, and behavior responses over the past few weeks. The OPT therapist worked with the patient utilizing an in session Cognitive Behavioral Therapy exercise. The patient was responsive in the session and verbalized, " I injured my spine/neck the other day bringing in a large container of water". 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 The OPT therapist worked with the patient in this session providing support and reviewing with the patient and placing emphasis on the patient working to remain active while using his judgement with the approval of his physican as the patient has cardiac health condition and utilizing his support symptom. The OPT therapist worked with the patient on coping strategies to assist in management of his mood and anxiety.The OPT therapist will continue treatment work with the patient in his  next scheduled session   Plan: Return again in 2/3 weeks.   Diagnosis:      Axis I: PTSD/Panic Disorder/ Depression  Axis II: No diagnosis      Collaboration of Care: No additional collaboration of care for this session.   Patient/Guardian was advised Release of Information must be obtained prior to any record release in order to collaborate their care with an outside provider. Patient/Guardian was advised if they have not already done so to contact the registration department to sign all necessary forms in order for [name] to release information regarding their care.    Consent: Patient/Guardian gives verbal consent for treatment and assignment of benefits for services provided during this visit. Patient/Guardian expressed understanding and agreed to proceed.    I discussed the assessment  and treatment plan with the patient. The patient was provided an opportunity to ask questions and all were answered. The patient agreed with the plan  and demonstrated an understanding of the instructions.   The patient was advised to call back or seek an in-person evaluation if the symptoms worsen or if the condition fails to improve as anticipated.   I provided 45 minutes of non-face-to-face time during this encounter.   [name], LCSW   10/10/2022

## 2022-10-12 ENCOUNTER — Telehealth: Payer: Self-pay

## 2022-10-12 NOTE — Telephone Encounter (Signed)
Pt [employer] application for refill received from [facility];  [name] signed and faxed back to [facility] LPN [id number]

## 2022-10-16 DIAGNOSIS — Z419 Encounter for procedure for purposes other than remedying health state, unspecified: Secondary | ICD-10-CM | POA: Diagnosis not present

## 2022-10-20 DIAGNOSIS — Z114 Encounter for screening for human immunodeficiency virus [HIV]: Secondary | ICD-10-CM | POA: Diagnosis not present

## 2022-10-20 DIAGNOSIS — I10 Essential (primary) hypertension: Secondary | ICD-10-CM | POA: Diagnosis not present

## 2022-10-20 DIAGNOSIS — E038 Other specified hypothyroidism: Secondary | ICD-10-CM | POA: Diagnosis not present

## 2022-10-20 DIAGNOSIS — Z1159 Encounter for screening for other viral diseases: Secondary | ICD-10-CM | POA: Diagnosis not present

## 2022-10-20 DIAGNOSIS — E559 Vitamin D deficiency, unspecified: Secondary | ICD-10-CM | POA: Diagnosis not present

## 2022-10-20 DIAGNOSIS — E118 Type 2 diabetes mellitus with unspecified complications: Secondary | ICD-10-CM | POA: Diagnosis not present

## 2022-10-20 DIAGNOSIS — E782 Mixed hyperlipidemia: Secondary | ICD-10-CM | POA: Diagnosis not present

## 2022-10-21 LAB — CMP14+EGFR
ALT: 26 IU/L (ref 0–44)
AST: 15 IU/L (ref 0–40)
Albumin/Globulin Ratio: 2.3 — ABNORMAL HIGH (ref 1.2–2.2)
Albumin: 4.8 g/dL (ref 3.8–4.9)
Alkaline Phosphatase: 63 IU/L (ref 44–121)
BUN/Creatinine Ratio: 13 (ref 9–20)
BUN: 15 mg/dL (ref 6–24)
Bilirubin Total: 0.5 mg/dL (ref 0.0–1.2)
CO2: 23 mmol/L (ref 20–29)
Calcium: 9.7 mg/dL (ref 8.7–10.2)
Chloride: 102 mmol/L (ref 96–106)
Creatinine, Ser: 1.12 mg/dL (ref 0.76–1.27)
Globulin, Total: 2.1 g/dL (ref 1.5–4.5)
Glucose: 109 mg/dL — ABNORMAL HIGH (ref 70–99)
Potassium: 4.4 mmol/L (ref 3.5–5.2)
Sodium: 142 mmol/L (ref 134–144)
Total Protein: 6.9 g/dL (ref 6.0–8.5)
eGFR: 77 mL/min/{1.73_m2} (ref >59)

## 2022-10-21 LAB — LIPID PANEL
Chol/HDL Ratio: 2.7 ratio (ref 0.0–5.0)
Cholesterol, Total: 115 mg/dL (ref 100–199)
HDL: 43 mg/dL (ref >39)
LDL Chol Calc (NIH): 58 mg/dL (ref 0–99)
Triglycerides: 67 mg/dL (ref 0–149)
VLDL Cholesterol Cal: 14 mg/dL (ref 5–40)

## 2022-10-21 LAB — CBC WITH DIFFERENTIAL/PLATELET
Basophils Absolute: 0 10*3/uL (ref 0.0–0.2)
Basos: 0 %
EOS (ABSOLUTE): 0.3 10*3/uL (ref 0.0–0.4)
Eos: 4 %
Hematocrit: 47.5 % (ref 37.5–51.0)
Hemoglobin: 16.5 g/dL (ref 13.0–17.7)
Immature Grans (Abs): 0 10*3/uL (ref 0.0–0.1)
Immature Granulocytes: 0 %
Lymphocytes Absolute: 2.5 10*3/uL (ref 0.7–3.1)
Lymphs: 35 %
MCH: 32 pg (ref 26.6–33.0)
MCHC: 34.7 g/dL (ref 31.5–35.7)
MCV: 92 fL (ref 79–97)
Monocytes Absolute: 0.6 10*3/uL (ref 0.1–0.9)
Monocytes: 8 %
Neutrophils Absolute: 3.7 10*3/uL (ref 1.4–7.0)
Neutrophils: 53 %
Platelets: 200 10*3/uL (ref 150–450)
RBC: 5.15 x10E6/uL (ref 4.14–5.80)
RDW: 12.5 % (ref 11.6–15.4)
WBC: 7 10*3/uL (ref 3.4–10.8)

## 2022-10-21 LAB — HIV ANTIBODY (ROUTINE TESTING W REFLEX): HIV Screen 4th Generation wRfx: NONREACTIVE

## 2022-10-21 LAB — HEMOGLOBIN A1C
Est. average glucose Bld gHb Est-mCnc: 123 mg/dL
Hgb A1c MFr Bld: 5.9 % — ABNORMAL HIGH (ref 4.8–5.6)

## 2022-10-21 LAB — HEPATITIS C ANTIBODY: Hep C Virus Ab: NONREACTIVE

## 2022-10-21 LAB — VITAMIN D 25 HYDROXY (VIT D DEFICIENCY, FRACTURES): Vit D, 25-Hydroxy: 47.8 ng/mL (ref 30.0–100.0)

## 2022-10-21 LAB — TSH+FREE T4
Free T4: 1.48 ng/dL (ref 0.82–1.77)
TSH: 2.45 u[IU]/mL (ref 0.450–4.500)

## 2022-10-21 NOTE — Progress Notes (Signed)
Your hemoglobin A1c is 5.9, this indicates that you are prediabetic. I recommend avoiding simple carbohydrates, including cakes, sweet desserts, ice cream, soda (diet or regular), sweet tea, candies, chips, cookies, store-bought juices, alcohol in excess of 1-2 drinks a day, lemonade, artificial sweeteners, donuts, coffee creamers, and sugar-free products.  I recommend avoiding greasy, fatty foods with increased physical activity.  All other labs are stable

## 2022-10-23 ENCOUNTER — Encounter (HOSPITAL_COMMUNITY): Payer: Self-pay

## 2022-10-23 ENCOUNTER — Emergency Department (HOSPITAL_COMMUNITY)
Admission: EM | Admit: 2022-10-23 | Discharge: 2022-10-24 | Disposition: A | Discharge status: Home / Self Care | Payer: Medicaid Other | Attending: Emergency Medicine | Admitting: Emergency Medicine

## 2022-10-23 ENCOUNTER — Other Ambulatory Visit: Payer: Self-pay | Admitting: Gastroenterology

## 2022-10-23 ENCOUNTER — Other Ambulatory Visit: Payer: Self-pay

## 2022-10-23 DIAGNOSIS — J449 Chronic obstructive pulmonary disease, unspecified: Secondary | ICD-10-CM | POA: Diagnosis not present

## 2022-10-23 DIAGNOSIS — I2510 Atherosclerotic heart disease of native coronary artery without angina pectoris: Secondary | ICD-10-CM | POA: Insufficient documentation

## 2022-10-23 DIAGNOSIS — Z7901 Long term (current) use of anticoagulants: Secondary | ICD-10-CM | POA: Diagnosis not present

## 2022-10-23 DIAGNOSIS — I10 Essential (primary) hypertension: Secondary | ICD-10-CM | POA: Insufficient documentation

## 2022-10-23 DIAGNOSIS — I4891 Unspecified atrial fibrillation: Secondary | ICD-10-CM | POA: Diagnosis not present

## 2022-10-23 DIAGNOSIS — Z8673 Personal history of transient ischemic attack (TIA), and cerebral infarction without residual deficits: Secondary | ICD-10-CM | POA: Insufficient documentation

## 2022-10-23 DIAGNOSIS — Z743 Need for continuous supervision: Secondary | ICD-10-CM | POA: Diagnosis not present

## 2022-10-23 DIAGNOSIS — Z87891 Personal history of nicotine dependence: Secondary | ICD-10-CM | POA: Diagnosis not present

## 2022-10-23 DIAGNOSIS — R0789 Other chest pain: Secondary | ICD-10-CM | POA: Insufficient documentation

## 2022-10-23 DIAGNOSIS — R079 Chest pain, unspecified: Secondary | ICD-10-CM | POA: Diagnosis not present

## 2022-10-23 LAB — CBC WITH DIFFERENTIAL/PLATELET
Abs Immature Granulocytes: 0.01 10*3/uL (ref 0.00–0.07)
Basophils Absolute: 0 10*3/uL (ref 0.0–0.1)
Basophils Relative: 0 %
Eosinophils Absolute: 0.2 10*3/uL (ref 0.0–0.5)
Eosinophils Relative: 2 %
HCT: 41.2 % (ref 39.0–52.0)
Hemoglobin: 14.8 g/dL (ref 13.0–17.0)
Immature Granulocytes: 0 %
Lymphocytes Relative: 15 %
Lymphs Abs: 1.4 10*3/uL (ref 0.7–4.0)
MCH: 32 pg (ref 26.0–34.0)
MCHC: 35.9 g/dL (ref 30.0–36.0)
MCV: 89.2 fL (ref 80.0–100.0)
Monocytes Absolute: 0.6 10*3/uL (ref 0.1–1.0)
Monocytes Relative: 7 %
Neutro Abs: 6.9 10*3/uL (ref 1.7–7.7)
Neutrophils Relative %: 76 %
Platelets: 173 10*3/uL (ref 150–400)
RBC: 4.62 MIL/uL (ref 4.22–5.81)
RDW: 12 % (ref 11.5–15.5)
WBC: 9.1 10*3/uL (ref 4.0–10.5)
nRBC: 0 % (ref 0.0–0.2)

## 2022-10-23 LAB — COMPREHENSIVE METABOLIC PANEL
ALT: 30 U/L (ref 0–44)
AST: 23 U/L (ref 15–41)
Albumin: 3.7 g/dL (ref 3.5–5.0)
Alkaline Phosphatase: 47 U/L (ref 38–126)
Anion gap: 10 (ref 5–15)
BUN: 20 mg/dL (ref 6–20)
CO2: 24 mmol/L (ref 22–32)
Calcium: 9 mg/dL (ref 8.9–10.3)
Chloride: 105 mmol/L (ref 98–111)
Creatinine, Ser: 1.09 mg/dL (ref 0.61–1.24)
GFR, Estimated: >60 mL/min (ref >60)
Glucose, Bld: 156 mg/dL — ABNORMAL HIGH (ref 70–99)
Potassium: 3.4 mmol/L — ABNORMAL LOW (ref 3.5–5.1)
Sodium: 139 mmol/L (ref 135–145)
Total Bilirubin: 0.2 mg/dL — ABNORMAL LOW (ref 0.3–1.2)
Total Protein: 6.3 g/dL — ABNORMAL LOW (ref 6.5–8.1)

## 2022-10-23 LAB — TROPONIN I (HIGH SENSITIVITY): Troponin I (High Sensitivity): 6 ng/L (ref <18)

## 2022-10-23 MED ORDER — NITROGLYCERIN 0.4 MG SL SUBL: 0.4000 mg | SUBLINGUAL_TABLET | SUBLINGUAL | Status: DC | PRN

## 2022-10-23 MED ORDER — ASPIRIN 81 MG PO CHEW
324.0000 mg | CHEWABLE_TABLET | Freq: Once | ORAL | Status: AC
  Administered 2022-10-23: 324 mg via ORAL
  Filled 2022-10-23: qty 4

## 2022-10-23 NOTE — ED Provider Notes (Signed)
[place] EMERGENCY DEPARTMENT AT [facility] Provider Note  Medical Decision Making   HPI: Richard [name] is a 57 y.o. male with history perinent for tobacco use, COPD not on home oxygen, GERD, HLD, HTN, anxiety on Xanax, TIA, CAD, A-fib on Xarelto, diabetes mellitus who presents complaining of chest pain. Patient arrived via EMS from home.  History provided by patient and family at bedside.  No interpreter required for this encounter  Patient reports that he was at home this evening, took his nightly Xarelto metoprolol, thereafter developed a substernal pressure.  He checked his heart rate, and found that it was 37 on a pulse ox.  Thereafter he developed significant anxiety as well as chest pain that was 10/10 in severity, radiated to his left jaw as well as left arm.  Patient reports that he then took a Xanax with gradual decrease in his symptoms.  Reports that symptoms are currently 1/10, reports that he has a prescription for nitroglycerin, however did not take nitroglycerin or ASA prior to arrival, denies any medications provided by EMS en route.  Reports that he has not previously had any MI, however received a cardiac cath previously and was found to have CAD  ROS: As per HPI. Please see MAR for complete past medical history, surgical history, and social history.   Physical exam does not have any focal abnormalities.  The differential includes but is not limited to anxiety, metoprolol overdose, ACS,  arrhythmia, pericardial tamponade, pericarditis, myocarditis, pneumonia, pneumothorax, esophageal, tear, perforated abdominal viscous, pulmonary embolism, aortic dissection, costochondritis, musculoskeletal chest wall pain, GERD.    Additional history obtained from: Wife at bedside External records from outside source obtained and reviewed including: None  ED provider interpretation of ECG: Rate 68, sinus rhythm, biphasic T wave in lead V1, no ST elevations or depressions.  ED  provider interpretation of radiology/imaging: Chest x-ray without any focal airspace opacification, cardiomediastinal silhouette derangement, pneumothorax, pleural effusion, bony displacement  Labs ordered were interpreted by myself as well as my attending and were incorporated into the medical decision making process for this patient.  ED provider interpretation of labs: CBC without leukocytosis, anemia, thrombocytopenia, CMP without AKI or emergent electrolyte derangement.  Initial troponin 6.  Delta troponin pending.  Interventions: ASA  See the EMR for full details regarding lab and imaging results.  Richard [name] is awake, alert, and HDS.  His exam is nonfocal.  Aspirin has been given.  The ECG reveals no anatomical ischemia representing STEMI, New-Onset Arrhythmia, or ischemic equivalent.  He has been risk stratified with a HEAR score of 4. Initial troponin is 6; delta troponin is pending at the time of handoff.  The patient's presentation, the patient being hemodynamically stable, and the ECG are not consistent with Pericardial Tamponade. The patient's pain is not positional. This in conjunction with the lack of PR depressions and ST elevations on the ECG are reassuring against Pericarditis. The patient's non-elevated troponin and ECG are also inconsistent with Myocarditis.  The CXR is unremarkable for focal airspace disease.  The patient is afebrile and denies productive cough.  Therefore, I do not suspect Pneumonia. There is no evidence of Pneumothorax on physical exam or on the CXR. CXR shows no evidence of Esophageal Tear and there is no recent intractable emesis or esophageal instrumentation. There is no peritonitis or free air on CXR worrisome for a Perforated Abdominal Viscous.  The patient's pain is not tearing and it does not radiate to back. Pulses are present bilaterally  in both the upper and lower extremities. CXR does not show a widened mediastinum. I have a very low suspicion for  Aortic Dissection.   Given patient's symptoms worsened after noting an abnormal heart rate, patient reports that he had significant anxiety while he was experiencing chest pain and symptoms improved greatly with Xanax, therefore possible that this is related to anxiety.  Overall, patient's description of chest pain is atypical, workup thus far reassuring, delta troponin is pending, patient reports that he does have follow-up on 11/07/2022 with cardiology.  Discussed that if delta troponin is negative, likely stable for discharge with maintaining outpatient follow-up with his cardiologist.  Consults: None by the time of handoff  Disposition: HANDOFF: At the time of signout, the patients delta troponin had not yet been completed. I transferred care of the patient at the time of signout to Dr. Pilar Plate. I informed the incoming care provider of the patient's history, status, and management plan. I addressed all of their concerns and/or questions to the best of my ability. Please refer to the incoming care provider's note for details regarding the remainder of the patient's ED course and disposition.  The plan for this patient was discussed with Dr. Jodi Mourning, who voiced agreement and who oversaw evaluation and treatment of this patient.  Clinical Impression:  1. Atypical chest pain    Data Unavailable  Therapies: These medications and interventions were provided for the patient while in the ED. Medications  nitroGLYCERIN (NITROSTAT) SL tablet 0.4 mg (has no administration in time range)  aspirin chewable tablet 324 mg (324 mg Oral Given 10/23/22 2220)    MDM generated using voice dictation software and may contain dictation errors.  Please contact me for any clarification or with any questions.  Clinical Complexity A medically appropriate history, review of systems, and physical exam was performed.  Collateral history obtained from: Family at bedside I personally reviewed the labs, EKG, imaging as  discussed above. Patient's presentation is most consistent with acute complicated illness / injury requiring diagnostic workup Considered and ruled out life and body threatening conditions  Treatment: Pending at the time of signout Medications: OTC Discussed patient's care with providers from the following different specialties: None by the time of signout    Physical Exam   ED Triage Vitals [10/23/22 2136]  Enc Vitals Group     BP (!) 141/79     Pulse Rate 70     Resp (!) 22     Temp      Temp Source Oral     SpO2 98 %     Weight 176 lb (79.8 kg)     Height 5\' 11"  (1.803 m)     Head Circumference      Peak Flow      Pain Score 2     Pain Loc      Pain Edu?      Excl. in GC?      Physical Exam Vitals and nursing note reviewed.  Constitutional:      General: He is not in acute distress.    Appearance: He is well-developed.  HENT:     Head: Normocephalic and atraumatic.  Eyes:     Conjunctiva/sclera: Conjunctivae normal.  Cardiovascular:     Rate and Rhythm: Normal rate and regular rhythm.     Pulses:          Radial pulses are 2+ on the right side and 2+ on the left side.     Heart sounds: No  murmur heard. Pulmonary:     Effort: Pulmonary effort is normal. No respiratory distress.     Breath sounds: Normal breath sounds.  Abdominal:     Palpations: Abdomen is soft.     Tenderness: There is no abdominal tenderness.  Musculoskeletal:        General: No swelling.     Cervical back: Neck supple.     Right lower leg: No tenderness. No edema.     Left lower leg: No tenderness. No edema.  Skin:    General: Skin is warm and dry.     Capillary Refill: Capillary refill takes less than 2 seconds.  Neurological:     General: No focal deficit present.     Mental Status: He is alert.  Psychiatric:        Mood and Affect: Mood normal.       Procedure Note  Procedures  DG Chest Portable 1 View    (Results Pending)     Curley Spice, MD 10/24/22 Leanord Hawking     Blane Ohara, MD 10/25/22 [phone]

## 2022-10-23 NOTE — ED Triage Notes (Signed)
Pt to ED by EMS from home with c/o chest pressure to the left side. Pt has a significant cardiac history. Pt arrives A+O, VSS, NADN

## 2022-10-24 ENCOUNTER — Emergency Department (HOSPITAL_COMMUNITY): Payer: Medicaid Other

## 2022-10-24 ENCOUNTER — Telehealth: Payer: Self-pay | Admitting: Family Medicine

## 2022-10-24 DIAGNOSIS — R079 Chest pain, unspecified: Secondary | ICD-10-CM | POA: Diagnosis not present

## 2022-10-24 DIAGNOSIS — I10 Essential (primary) hypertension: Secondary | ICD-10-CM | POA: Diagnosis not present

## 2022-10-24 LAB — TROPONIN I (HIGH SENSITIVITY): Troponin I (High Sensitivity): 11 ng/L (ref <18)

## 2022-10-24 NOTE — Telephone Encounter (Signed)
Urine was for [facility] send off.

## 2022-10-24 NOTE — Telephone Encounter (Signed)
Patient dropped off urine sample dropped off in lab Richard [name] patient) he was told last week to come in and drop off today

## 2022-10-24 NOTE — ED Provider Notes (Signed)
  Provider Note MRN:  [id number]  Arrival date & time: 10/24/22    ED Course and Medical Decision Making  Assumed care from Dr. Jodi Mourning at shift change.  Atypical chest pain awaiting second troponin.  Second troponin is negative, patient comfortable with outpatient follow-up with cardiology.  Procedures  Final Clinical Impressions(s) / ED Diagnoses     ICD-10-CM   1. Atypical chest pain  R07.89       ED Discharge Orders     None         Discharge Instructions      You were evaluated in the Emergency Department and after careful evaluation, we did not find any emergent condition requiring admission or further testing in the hospital.  Your exam/testing today is overall reassuring.  Please return to the Emergency Department if you experience any worsening of your condition.   Thank you for allowing [name] to be a part of your care.    Elmer Sow. Pilar Plate, MD [facility] Emergency Medicine [facility] [email or web address]    Sabas Sous, MD 10/24/22 [phone]

## 2022-10-24 NOTE — Discharge Instructions (Signed)
You were evaluated in the Emergency Department and after careful evaluation, we did not find any emergent condition requiring admission or further testing in the hospital.  Your exam/testing today is overall reassuring.  Please return to the Emergency Department if you experience any worsening of your condition.   Thank you for allowing us to be a part of your care. 

## 2022-10-25 ENCOUNTER — Ambulatory Visit: Payer: Medicaid Other | Admitting: Orthopaedic Surgery

## 2022-10-25 ENCOUNTER — Telehealth: Payer: Self-pay

## 2022-10-25 ENCOUNTER — Encounter: Payer: Self-pay | Admitting: Orthopaedic Surgery

## 2022-10-25 VITALS — BP 125/80 | HR 56 | Ht 71.0 in | Wt 176.0 lb

## 2022-10-25 DIAGNOSIS — M25561 Pain in right knee: Secondary | ICD-10-CM | POA: Diagnosis not present

## 2022-10-25 DIAGNOSIS — F17210 Nicotine dependence, cigarettes, uncomplicated: Secondary | ICD-10-CM

## 2022-10-25 DIAGNOSIS — G8929 Other chronic pain: Secondary | ICD-10-CM | POA: Diagnosis not present

## 2022-10-25 NOTE — Transitions of Care (Post Inpatient/ED Visit) (Signed)
   10/25/2022  Name: Richard [name] MRN: [id number] DOB: August 26, 1965  Today's TOC FU Call Status: Today's TOC FU Call Status:: Successful TOC FU Call Competed TOC FU Call Complete Date: 10/25/22  Transition Care Management Follow-up Telephone Call Date of Discharge: 10/24/22 Discharge Facility: [facility]) Type of Discharge: Emergency Department Reason for ED Visit: Other: (Atypical chest pain) How have you been since you were released from the hospital?: Better Any questions or concerns?: No  Items Reviewed: Did you receive and understand the discharge instructions provided?: Yes Medications obtained and verified?: Yes (Medications Reviewed) Any new allergies since your discharge?: No Dietary orders reviewed?: NA Do you have support at home?: Yes People in Home: significant other  Home Care and Equipment/Supplies: Were Home Health Services Ordered?: NA Any new equipment or medical supplies ordered?: NA  Functional Questionnaire: Do you need assistance with bathing/showering or dressing?: No Do you need assistance with meal preparation?: No Do you need assistance with eating?: No Do you have difficulty maintaining continence: No Do you need assistance with getting out of bed/getting out of a chair/moving?: No Do you have difficulty managing or taking your medications?: No  Follow up appointments reviewed: PCP Follow-up appointment confirmed?: NA (pt declined at this tme) Specialist Hospital Follow-up appointment confirmed?: NA Do you need transportation to your follow-up appointment?: No Do you understand care options if your condition(s) worsen?: Yes-patient verbalized understanding    Agnes Lawrence, CMA (AAMA)  [facility] [phone]

## 2022-10-25 NOTE — Patient Instructions (Signed)
You will have your imaging done at [facility]. Call and schedule your appointment within one week. We will work on Paramedic.   [facility] [address] [place] [address] PHONE: [phone]  Follow up after MRI

## 2022-10-25 NOTE — Progress Notes (Signed)
My knee is worse.  His right knee is more painful, swelling and giving way.  He has pain at night very often.    Right knee has pain, effusion, crepitus, positive medial McMurray, limp right, NV intact, no distal edema.  Encounter Diagnoses  Name Primary?   Chronic pain of right knee Yes   Nicotine dependence, cigarettes, uncomplicated    I will get MRI of the right knee.  Return in two weeks.  Call if any problem.  Precautions discussed.  Electronically Signed Darreld Mclean, MD 4/10/202410:03 AM

## 2022-10-26 ENCOUNTER — Ambulatory Visit (INDEPENDENT_AMBULATORY_CARE_PROVIDER_SITE_OTHER): Payer: Medicaid Other | Admitting: Gastroenterology

## 2022-10-26 ENCOUNTER — Encounter: Payer: Self-pay | Admitting: Gastroenterology

## 2022-10-26 VITALS — BP 134/83 | HR 63 | Temp 97.8°F | Ht 71.0 in | Wt 172.2 lb

## 2022-10-26 DIAGNOSIS — R1310 Dysphagia, unspecified: Secondary | ICD-10-CM

## 2022-10-26 LAB — MICROALBUMIN / CREATININE URINE RATIO
Creatinine, Urine: 149.7 mg/dL
Microalb/Creat Ratio: 4 mg/g creat (ref 0–29)
Microalbumin, Urine: 5.3 ug/mL

## 2022-10-26 NOTE — Progress Notes (Signed)
Gastroenterology Office Note     Primary Care Physician:  Gilmore LarocheZarwolo, Gloria, FNP  Primary Gastroenterologist: Dr. Marletta Lorarver   Chief Complaint   Chief Complaint  Patient presents with   Discomfort in chest    Patient here today saying he is having some discomfort in mid to upper epigastric region. He says he feels as if he is having some type of swelling in upper chest after eating and drinking. Patient has a history of Genella RifeGerd and is on esomeprazole 40 mg bid, and Famotidine qhs prn.     History of Present Illness   Richard [name] is a 57 y.o. male presenting today in follow-up with a history of chronic GERD, abdominal pain, dysphagia, constipation, weight loss. 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   CTA unremarkable in the past. HIDA with borderline EF of 33%.   Nexium BID controlling GERD symptoms. Feels like a knot comes up in esophagus when eating. Will take Maalox and helps when it coats. Feels like it slows afib down. Larger pills difficult to get down. Feels like food is slower going down esophagus. Edentulous upper palate. Having to gum food, eating softer food.   Constipation comes and goes. Will take Miralax as needed.   EGD 07/2021: small hiatal hernia, abnormal esophageal motility, iron pill gastritis, no h.pylori, mucosal variant in duodenum (bx with focal lymphangiectasia, neg for celiac)  Last colonoscopy 2018, 1 benign polyp removed, recommended 10-year recall.   Past Medical History:  Diagnosis Date   Arthritis    Asthma    CAD (coronary artery disease)    a. Cardiac cath 07/2015 showed 65% distal Cx, 20% mid-distal LAD, 20% prox-distal RCA, EF 60%, EDP 15mmHg.   Colitis 1990   COPD (chronic obstructive pulmonary disease)    Depression    Essential hypertension    Gastric ulcer 2003; 2012   2003: + esophagitis; negative H.pylori serology  2012: Dr. Darrick PennaFields, mild gastritis, Bravo PH probe placement, negative H.pylori   GERD  (gastroesophageal reflux disease)    Hepatic steatosis    History of hiatal hernia    Hyperlipemia    Overweight    Panic attacks    Paroxysmal atrial fibrillation    Stroke    TIA (transient ischemic attack)    Type II diabetes mellitus     Past Surgical History:  Procedure Laterality Date   BALLOON DILATION N/A 07/25/2021   Procedure: BALLOON DILATION;  Surgeon: Lanelle Balarver, Charles K, DO;  [facility];  Service: Endoscopy;  Laterality: N/A;   BIOPSY  07/25/2021   Procedure: BIOPSY;  Surgeon: Lanelle Balarver, Charles K, DO;  [facility];  Service: Endoscopy;;   BRAVO PH STUDY  05/03/2011   ZOX:WRUESLF:Mild gastritis/normal esophagus and duodenum   CARDIAC CATHETERIZATION  1990s X 1; 2005; 08/12/2015   CARDIAC CATHETERIZATION N/A 08/12/2015   Procedure: Left Heart Cath and Coronary Angiography;  Surgeon: Lyn RecordsHenry W Smith, MD; LAD 20%, CFX 65%, RCA 20%, EF 60%    COLONOSCOPY  1990   COLONOSCOPY WITH PROPOFOL N/A 11/21/2016   Dr. Darrick PennaFields: non-thrombosed external hemorrhoids, one 6 mm polyp (polypoid lesion), internal hemorrhoids. TI Normal. 10 years screening   ESOPHAGOGASTRODUODENOSCOPY  05/03/2011   AVW:[id number]:mild gastritis   ESOPHAGOGASTRODUODENOSCOPY (EGD) WITH PROPOFOL N/A 07/25/2021   Procedure: ESOPHAGOGASTRODUODENOSCOPY (EGD) WITH PROPOFOL;  Surgeon: Lanelle Balarver, Charles K, DO;  [facility];  Service: Endoscopy;  Laterality: N/A;  1:30pm   NECK MASS EXCISION Right    "done in  dr's office; behind right ear/side of ncek"   POLYPECTOMY  11/21/2016   Procedure: POLYPECTOMY;  Surgeon: West Bali, MD;  [facility];  Service: Endoscopy;;  descending colon polyp   SHOULDER ARTHROSCOPY W/ ROTATOR CUFF REPAIR Right 2006   acromioclavicular joint arthrosis    Current Outpatient Medications  Medication Sig Dispense Refill   acetaminophen (TYLENOL) 325 MG tablet Take 650 mg by mouth every 6 (six) hours as needed.     albuterol (VENTOLIN HFA) 108 (90 Base) MCG/ACT inhaler INHALE  2 PUFFS BY MOUTH EVERY 6 HOURS AS NEEDED FOR COUGHING, WHEEZING, OR SHORTNESS OF BREATH 20.1 g 0   ALPRAZolam (XANAX) 0.5 MG tablet Take one twice a day and two at bedtime 120 tablet 2   amLODipine (NORVASC) 10 MG tablet Take 1 tablet (10 mg total) by mouth daily. 90 tablet 0   dronedarone (MULTAQ) 400 MG tablet Take 1 tablet (400 mg total) by mouth 2 (two) times daily with a meal. 180 tablet 3   esomeprazole (NEXIUM) 40 MG capsule Take 1 capsule (40 mg total) by mouth 2 (two) times daily before a meal. 60 capsule 11   famotidine (PEPCID) 20 MG tablet Take 20 mg by mouth at bedtime.     metoprolol succinate (TOPROL XL) 25 MG 24 hr tablet Take 0.5 tablets (12.5 mg total) by mouth daily. 45 tablet 3   nitroGLYCERIN (NITROSTAT) 0.4 MG SL tablet Place 1 tablet (0.4 mg total) under the tongue every 5 (five) minutes x 3 doses as needed for chest pain (if no relief after 3rd dose, proceed to the ED for an evaluation). 25 tablet 3   rivaroxaban (XARELTO) 20 MG TABS tablet TAKE 1 TABLET BY MOUTH ONCE DAILY WITH  SUPPER 90 tablet 1   simvastatin (ZOCOR) 20 MG tablet Take 1 tablet by mouth once daily 90 tablet 3   vitamin B-12 (CYANOCOBALAMIN) 50 MCG tablet Take 50 mcg by mouth daily.     VITAMIN D PO Take 1 tablet by mouth daily.     No current facility-administered medications for this visit.    Allergies as of 10/26/2022 - Review Complete 10/26/2022  Allergen Reaction Noted   Dexilant [dexlansoprazole] Anaphylaxis 01/20/2015   Mushroom ext cmplx(shiitake-reishi-mait) Anaphylaxis 03/22/2011   Penicillins Anaphylaxis    Doxycycline Nausea And Vomiting 04/18/2016    Family History  Problem Relation Age of Onset   Lung cancer Mother    Alcohol abuse Mother    Heart attack Father 1   Diabetes Father    Alcohol abuse Father    Hypertension Brother    Hypertension Brother    Anxiety disorder Sister    Depression Sister    Anxiety disorder Sister    Heart attack Brother 56   Diabetes Brother     Hypertension Brother    Seizures Brother    Dementia Paternal Uncle    Dementia Cousin    ADD / ADHD Daughter    Colon cancer Neg Hx    Drug abuse Neg Hx    Bipolar disorder Neg Hx    OCD Neg Hx    Paranoid behavior Neg Hx    Schizophrenia Neg Hx    Sexual abuse Neg Hx    Physical abuse Neg Hx     Social History   Socioeconomic History   Marital status: Married    Spouse name: Not on file   Number of children: Not on file   Years of education: Not on file  Highest education level: Not on file  Occupational History   Occupation: full time    Employer: UNEMPLOYED  Tobacco Use   Smoking status: Every Day    Packs/day: 0.50    Years: 25.00    Additional pack years: 0.00    Total pack years: 12.50    Types: Cigarettes    Start date: 07/17/1982   Smokeless tobacco: Never   Tobacco comments:    1/2 pack a day  Vaping Use   Vaping Use: Never used  Substance and Sexual Activity   Alcohol use: No    Alcohol/week: 0.0 standard drinks of alcohol   Drug use: No   Sexual activity: Yes    Birth control/protection: None  Other Topics Concern   Not on file  Social History Narrative   Pt lives in [place] with wife.  5 children.  Unemployed due to panic attacks and back pain   Social Determinants of Health   Financial Resource Strain: Not on file  Food Insecurity: Not on file  Transportation Needs: Not on file  Physical Activity: Not on file  Stress: Not on file  Social Connections: Not on file  Intimate Partner Violence: Not on file     Review of Systems   Gen: Denies any fever, chills, fatigue, weight loss, lack of appetite.  CV: Denies chest pain, heart palpitations, peripheral edema, syncope.  Resp: Denies shortness of breath at rest or with exertion. Denies wheezing or cough.  GI: see HPI GU : Denies urinary burning, urinary frequency, urinary hesitancy MS: Denies joint pain, muscle weakness, cramps, or limitation of movement.  Derm: Denies rash,  itching, dry skin Psych: Denies depression, anxiety, memory loss, and confusion Heme: Denies bruising, bleeding, and enlarged lymph nodes.   Physical Exam   BP 134/83 (BP Location: Right Arm, Patient Position: Sitting, Cuff Size: Normal)   Pulse 63   Temp 97.8 F (36.6 C) (Temporal)   Ht 5\' 11"  (1.803 m)   Wt 172 lb 3.2 oz (78.1 kg)   BMI 24.02 kg/m  General:   Alert and oriented. Pleasant and cooperative. Well-nourished and well-developed.  Head:  Normocephalic and atraumatic. Eyes:  Without icterus Abdomen:  +BS, soft, mild TTP epigastric  and non-distended. No HSM noted. No guarding or rebound. No masses appreciated.  Rectal:  Deferred  Msk:  Symmetrical without gross deformities. Normal posture. Extremities:  Without edema. Neurologic:  Alert and  oriented x4;  grossly normal neurologically. Skin:  Intact without significant lesions or rashes. Psych:  Alert and cooperative. Normal mood and affect.   Assessment   Richard [name] is a 57 y.o. male presenting today in follow-up with a history of chronic GERD, abdominal pain, dysphagia, constipation, weight loss. 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   GERD: controlled with Nexium BID.  Dysphagia: noting increased difficulty with food, larger pills. Feels "knot" in his esophagus. Query spasms +/- motility disorder contributing. Last EGD Jan 2023.   Constipation: Miralax as needed.   STOP XARELTO 48 hours prior.    PLAN    Proceed with upper endoscopy/dilation by Dr. Marletta Lor in near future: the risks, benefits, and alternatives have been discussed with the patient in detail. The patient states understanding and desires to proceed.  Consider manometry if EGD negative   Gelene Mink, PhD, [facility] Gastroenterology

## 2022-10-26 NOTE — Patient Instructions (Signed)
We are arranging an upper endoscopy with dilation by Dr. Marletta Lor in the future.  If this is normal or no improvement in your symptoms, we can do a special test that measures pressures in your esophagus.  Will see you in follow-up after the procedure!  I enjoyed seeing you again today! At our first visit, I mentioned how I value our relationship and want to provide genuine, compassionate, and quality care. You may receive a survey regarding your visit with me, and I welcome your feedback! Thanks so much for taking the time to complete this. I look forward to seeing you again.   Gelene Mink, PhD, ANP-BC [facility] Gastroenterology

## 2022-11-03 ENCOUNTER — Other Ambulatory Visit: Payer: Medicaid Other

## 2022-11-05 ENCOUNTER — Encounter: Payer: Self-pay | Admitting: Physician Assistant

## 2022-11-05 NOTE — Progress Notes (Unsigned)
Cardiology Office Note    Date:  11/06/2022   ID:  Richard [name] 1965/07/20, MRN [id number]  PCP:  Richard Laroche, FNP  Cardiologist:  Nona Dell, MD  Electrophysiologist:  Lewayne Bunting, MD   Chief Complaint: overdue f/u, post ER visit  History of Present Illness:   Richard [name] is a 57 y.o. male with history of nonobstructive CAD, post-cath TIA 2017, mild carotid artery disease, PAF, tobacco abuse, DM, arthritis, asthma, COPD, colitis, remote gastric ulcer, gastritis, GERD, hepatic steatosis, hiatal hernia, HTN, HLD (followed by PCP), overweight, severe anxiety, depression, panic attacks, pulmonary nodules by CT 09/2021 (infectious vs inflammatory per report) who is seen for overdue cardiac follow-up.  To recap history, cardiac cath 07/2015 showed 65% distal Cx, 20% mid-distal LAD, 20% prox-distal RCA, EF 60%, EDP . This was complicated by post-cath TIA. Echo had shown normal EF. Clinical case has been complicated by chronic somatic symptomatology. Last stress test 12/2020 ws felt to be low risk (soft tissue attenuation rather than scar), EF 64%. He had recurrent atypical TIA sx in 09/2020 (bubble study was negative) as well as 07/2021 with stroke ruled out. Last echo 07/2021 EF 60-65%, dilated IVC otherwise unrevealing. From arrhythmia standpoint, he remotely failed flecainide and metoprolol. Ablation not pursued due to financial reasons. He was eventually switched to [facility] and has followed with Dr. Ladona Ridgel for this, last OV 10/2021. He's been only on low dose BB due to baseline sinus bradycardia. Sleep study has been recommended several times over the years but not performed.   He was recently seen in the ED 10/24/22 with atypical CP and negative troponins. He reports he'd eaten a tuna sub from [place] that tasted a bit off and began to develop a sense of slower pounding HR with pulse ox showing HR in the 30s. He began to get very anxious about this and subsequently HR went to 150s.  This was associated with some chest pressure that lasted several hours. ED workup unrevealing. EKG showed NSR. He later felt symptoms were due to a panic attack. Sees Dr. Ladona Ridgel tomorrow. He has not had any anginal type chest pain presently. He reports some chest discomfort that occurs with higher levels of excitement/anxiety but this is unchanged going back several years. He also has chronic DOE which he attributes to chronic spine issues, no recent change. He continues to have ongoing chronic fatigue. He'd like to revisit sleep study as long as it can be done at home. He is also following with GI for question of esophageal issue - has a problem with feeling like his esophagus knots up when he eats certain foods.   Labwork independently reviewed: 10/2022 CBC OK, K 3.4, Cr wnl, AST ALT OK, LDL 58, trig 67, TFTS ok, V7Q 5.9  Past History   Past Medical History:  Diagnosis Date   Arthritis    Asthma    CAD (coronary artery disease)    a. Cardiac cath 07/2015 showed 65% distal Cx, 20% mid-distal LAD, 20% prox-distal RCA, EF 60%, EDP .   Colitis 1990   COPD (chronic obstructive pulmonary disease)    Depression    Essential hypertension    Gastric ulcer 2003; 2012   2003: + esophagitis; negative H.pylori serology  2012: Dr. Darrick Penna, mild gastritis, Bravo PH probe placement, negative H.pylori   GERD (gastroesophageal reflux disease)    Hepatic steatosis    History of hiatal hernia    Hyperlipemia    Overweight    Panic  attacks    Paroxysmal atrial fibrillation    Pulmonary nodules    Stroke    TIA (transient ischemic attack)    Type II diabetes mellitus     Past Surgical History:  Procedure Laterality Date   BALLOON DILATION N/A 07/25/2021   Procedure: BALLOON DILATION;  Surgeon: Lanelle Bal, DO;  [facility];  Service: Endoscopy;  Laterality: N/A;   BIOPSY  07/25/2021   Procedure: BIOPSY;  Surgeon: Lanelle Bal, DO;  [facility];  Service: Endoscopy;;    BRAVO PH STUDY  05/03/2011   ZOX:WRUE gastritis/normal esophagus and duodenum   CARDIAC CATHETERIZATION  1990s X 1; 2005; 08/12/2015   CARDIAC CATHETERIZATION N/A 08/12/2015   Procedure: Left Heart Cath and Coronary Angiography;  Surgeon: Lyn Records, MD; LAD 20%, CFX 65%, RCA 20%, EF 60%    COLONOSCOPY  1990   COLONOSCOPY WITH PROPOFOL N/A 11/21/2016   Dr. Darrick Penna: non-thrombosed external hemorrhoids, one 6 mm polyp (polypoid lesion), internal hemorrhoids. TI Normal. 10 years screening   ESOPHAGOGASTRODUODENOSCOPY  05/03/2011   AVW:UJWJ gastritis   ESOPHAGOGASTRODUODENOSCOPY (EGD) WITH PROPOFOL N/A 07/25/2021   Procedure: ESOPHAGOGASTRODUODENOSCOPY (EGD) WITH PROPOFOL;  Surgeon: Lanelle Bal, DO;  [facility];  Service: Endoscopy;  Laterality: N/A;  1:30pm   NECK MASS EXCISION Right    "done in dr's office; behind right ear/side of ncek"   POLYPECTOMY  11/21/2016   Procedure: POLYPECTOMY;  Surgeon: West Bali, MD;  [facility];  Service: Endoscopy;;  descending colon polyp   SHOULDER ARTHROSCOPY W/ ROTATOR CUFF REPAIR Right 2006   acromioclavicular joint arthrosis    Current Medications: Current Meds  Medication Sig   acetaminophen (TYLENOL) 325 MG tablet Take 650 mg by mouth every 6 (six) hours as needed.   albuterol (VENTOLIN HFA) 108 (90 Base) MCG/ACT inhaler INHALE 2 PUFFS BY MOUTH EVERY 6 HOURS AS NEEDED FOR COUGHING, WHEEZING, OR SHORTNESS OF BREATH   ALPRAZolam (XANAX) 0.5 MG tablet Take one twice a day and two at bedtime   amLODipine (NORVASC) 10 MG tablet Take 1 tablet (10 mg total) by mouth daily.   dronedarone (MULTAQ) 400 MG tablet Take 1 tablet (400 mg total) by mouth 2 (two) times daily with a meal.   esomeprazole (NEXIUM) 40 MG capsule Take 1 capsule (40 mg total) by mouth 2 (two) times daily before a meal.   famotidine (PEPCID) 20 MG tablet Take 20 mg by mouth at bedtime.   metoprolol succinate (TOPROL XL) 25 MG 24 hr tablet Take 0.5  tablets (12.5 mg total) by mouth daily.   nicotine (NICODERM CQ - DOSED IN MG/24 HOURS) 14 mg/24hr patch 14 mg daily.   nicotine (NICODERM CQ - DOSED IN MG/24 HOURS) 21 mg/24hr patch 21 mg daily.   nicotine (NICODERM CQ - DOSED IN MG/24 HR) 7 mg/24hr patch Place 7 mg onto the skin daily.   nitroGLYCERIN (NITROSTAT) 0.4 MG SL tablet Place 1 tablet (0.4 mg total) under the tongue every 5 (five) minutes x 3 doses as needed for chest pain (if no relief after 3rd dose, proceed to the ED for an evaluation).   rivaroxaban (XARELTO) 20 MG TABS tablet TAKE 1 TABLET BY MOUTH ONCE DAILY WITH  SUPPER   simvastatin (ZOCOR) 20 MG tablet Take 1 tablet by mouth once daily   vitamin B-12 (CYANOCOBALAMIN) 50 MCG tablet Take 50 mcg by mouth daily.   VITAMIN D PO Take 1 tablet by mouth daily.  Allergies:   Dexilant [dexlansoprazole], Mushroom ext cmplx(shiitake-reishi-mait), Penicillins, and Doxycycline   Social History   Socioeconomic History   Marital status: Married    Spouse name: Not on file   Number of children: Not on file   Years of education: Not on file   Highest education level: Not on file  Occupational History   Occupation: full time    Employer: UNEMPLOYED  Tobacco Use   Smoking status: Every Day    Packs/day: 0.50    Years: 25.00    Additional pack years: 0.00    Total pack years: 12.50    Types: Cigarettes    Start date: 07/17/1982   Smokeless tobacco: Never   Tobacco comments:    1/2 pack a day  Vaping Use   Vaping Use: Never used  Substance and Sexual Activity   Alcohol use: No    Alcohol/week: 0.0 standard drinks of alcohol   Drug use: No   Sexual activity: Yes    Birth control/protection: None  Other Topics Concern   Not on file  Social History Narrative   Pt lives in [place] with wife.  5 children.  Unemployed due to panic attacks and back pain   Social Determinants of Health   Financial Resource Strain: Not on file  Food Insecurity: Not on file   Transportation Needs: Not on file  Physical Activity: Not on file  Stress: Not on file  Social Connections: Not on file     Family History:  The patient's family history includes ADD / ADHD in his daughter; Alcohol abuse in his father and mother; Anxiety disorder in his sister and sister; Dementia in his cousin and paternal uncle; Depression in his sister; Diabetes in his brother and father; Heart attack (age of onset: 29) in his brother; Heart attack (age of onset: 44) in his father; Hypertension in his brother, brother, and brother; Lung cancer in his mother; Seizures in his brother. There is no history of Colon cancer, Drug abuse, Bipolar disorder, OCD, Paranoid behavior, Schizophrenia, Sexual abuse, or Physical abuse.  ROS:   Please see the history of present illness. Some toe cramping at times. Can feel HR whooshing harder during dinner or at night. All other systems are reviewed and otherwise negative.    EKG(s)/Additional Testing   EKG:  EKG is ordered today, personally reviewed, demonstrating NSR 68bpm nonspecific STTW changes, QTC without significant change compared to old tracings going back many years  CV Studies: Cardiac studies reviewed are outlined and summarized above. Otherwise please see EMR for full report.  Recent Labs: 10/20/2022: TSH 2.450 10/23/2022: ALT 30; BUN 20; Creatinine, Ser 1.09; Hemoglobin 14.8; Platelets 173; Potassium 3.4; Sodium 139  Recent Lipid Panel    Component Value Date/Time   CHOL 115 10/20/2022 0850   TRIG 67 10/20/2022 0850   HDL 43 10/20/2022 0850   CHOLHDL 2.7 10/20/2022 0850   CHOLHDL 2.9 06/13/2022 0945   VLDL 8 06/13/2022 0945   LDLCALC 58 10/20/2022 0850    PHYSICAL EXAM:    VS:  BP 134/62   Pulse 60   Ht 5\' 11"  (1.803 m)   Wt 171 lb 9.6 oz (77.8 kg)   SpO2 98%   BMI 23.93 kg/m   BMI: Body mass index is 23.93 kg/m.  GEN: Well nourished, well developed male in no acute distress HEENT: normocephalic, atraumatic Neck:  no JVD, carotid bruits, or masses Cardiac: RRR; no murmurs, rubs, or gallops, no edema  Respiratory:  clear to  auscultation bilaterally, normal work of breathing GI: soft, nontender, nondistended, + BS MS: no deformity or atrophy Skin: warm and dry, no rash Neuro:  Alert and Oriented x 3, Strength and sensation are intact, follows commands Psych: euthymic mood, full affect  Wt Readings from Last 3 Encounters:  11/06/22 171 lb 9.6 oz (77.8 kg)  10/26/22 172 lb 3.2 oz (78.1 kg)  10/25/22 176 lb (79.8 kg)     ASSESSMENT & PLAN:   1. Nonobstructive CAD with HTN, HLD - recent ED visit with prolonged episode of chest pressure in the setting of variable HR with reassuring ER workup. Presence of negative troponins despite prolonged sx argues against ACS/active ischemia. He is not describing any accelerating anginal symptoms. Would continue medical therapy for now. If he develops recurrent symptoms, can consider updating workup, but otherwise he seems to be doing quite well given the historical degree of symptomatology compared to this visit. SBP is mildly above goal. Patient given instructions to monitor BP at home and relay readings in 1 week. Continue current regimen. Not on ASA due to Xarelto. Lipids are followed by PCP, remains on simvastatin. He is also pending EGD per notes. From gen cards standpoint he may proceed medically but will defer question of arrhythmia evaluation to Dr. Ladona Ridgel for follow-up below.  2. Paroxysmal atrial fibrillation - patient reports episodic breakthrough palpitations as well as possible bradycardia prompting ED visit earlier this month. What he describes almost sounds like it could have been PVCs ("slow, harder heartbeat"). EKG only showed NSR. He is in NSR by exam today. Recommend to keep f/u with Dr. Ladona Ridgel as scheduled tomorrow to discuss whether further workup is necessary - patient is managed with Multaq by EP team. Xarelto dose remains appropriate.  3. Hypokalemia  - recheck BMET/Mg today.  4. Pulmonary nodules - noted on CT 09/2021, discussed with patient and advise he discuss recheck with PCP.  5. Snoring - pt willing to pursue [place] sleep study and CPAP if necessary but does not think he would tolerate in lab study. Will ask nursing staff to explore if covered.  6. Carotid artery disease - mild by 2022 duplex. CTA head/neck 07/2021 without significant stenosis. Continue statin. No accelerating symptoms. No bruits on exam. Continue to follow clinically.    Disposition: F/u with Dr. Ladona Ridgel tomorrow, and Dr. Diona Browner in 6 months. Handicap placard renewed.   Medication Adjustments/Labs and Tests Ordered: Current medicines are reviewed at length with the patient today.  Concerns regarding medicines are outlined above. Medication changes, Labs and Tests ordered today are summarized above and listed in the Patient Instructions accessible in Encounters.    Signed, Laurann Montana, PA-C  11/06/2022 1:09 PM    [facility] [place] Location in [facility] [address] [place], [place] [address] Ph: [phone]; Fax [phone]

## 2022-11-06 ENCOUNTER — Other Ambulatory Visit (HOSPITAL_COMMUNITY)
Admission: RE | Admit: 2022-11-06 | Discharge: 2022-11-06 | Disposition: A | Discharge status: Home / Self Care | Payer: Medicaid Other | Source: Ambulatory Visit | Attending: Physician Assistant | Admitting: Physician Assistant

## 2022-11-06 ENCOUNTER — Encounter: Payer: Self-pay | Admitting: Physician Assistant

## 2022-11-06 ENCOUNTER — Ambulatory Visit: Payer: Medicaid Other | Attending: Physician Assistant | Admitting: Physician Assistant

## 2022-11-06 VITALS — BP 134/62 | HR 60 | Ht 71.0 in | Wt 171.6 lb

## 2022-11-06 DIAGNOSIS — E785 Hyperlipidemia, unspecified: Secondary | ICD-10-CM | POA: Diagnosis not present

## 2022-11-06 DIAGNOSIS — E876 Hypokalemia: Secondary | ICD-10-CM

## 2022-11-06 DIAGNOSIS — I6523 Occlusion and stenosis of bilateral carotid arteries: Secondary | ICD-10-CM | POA: Diagnosis not present

## 2022-11-06 DIAGNOSIS — I10 Essential (primary) hypertension: Secondary | ICD-10-CM

## 2022-11-06 DIAGNOSIS — R0683 Snoring: Secondary | ICD-10-CM

## 2022-11-06 DIAGNOSIS — I480 Paroxysmal atrial fibrillation: Secondary | ICD-10-CM | POA: Insufficient documentation

## 2022-11-06 DIAGNOSIS — I2510 Atherosclerotic heart disease of native coronary artery without angina pectoris: Secondary | ICD-10-CM

## 2022-11-06 DIAGNOSIS — R918 Other nonspecific abnormal finding of lung field: Secondary | ICD-10-CM

## 2022-11-06 LAB — BASIC METABOLIC PANEL
Anion gap: 7 (ref 5–15)
BUN: 17 mg/dL (ref 6–20)
CO2: 28 mmol/L (ref 22–32)
Calcium: 9.2 mg/dL (ref 8.9–10.3)
Chloride: 105 mmol/L (ref 98–111)
Creatinine, Ser: 0.9 mg/dL (ref 0.61–1.24)
GFR, Estimated: >60 mL/min (ref >60)
Glucose, Bld: 105 mg/dL — ABNORMAL HIGH (ref 70–99)
Potassium: 4.3 mmol/L (ref 3.5–5.1)
Sodium: 140 mmol/L (ref 135–145)

## 2022-11-06 LAB — MAGNESIUM: Magnesium: 2.3 mg/dL (ref 1.7–2.4)

## 2022-11-06 NOTE — Progress Notes (Signed)
Sleep Apnea Evaluation  [facility]  Today's Date: 11/06/2022   Patient Name: Richard [name]        DOB: June 03, 1966       Height:   (1.803 m)     Weight: 171 lb 9.6 oz (77.8 kg)  BMI: Body mass index is 23.93 kg/m.    Referring Provider:  Ronie Spies, PA-C   STOP-BANG RISK ASSESSMENT         If STOP-BANG Score ?3 OR two clinical symptoms - patient qualifies for WatchPAT (CPT 95800)      Sleep study ordered due to two (2) of the following clinical symptoms/diagnoses:  Excessive daytime sleepiness G47.10  Gastroesophageal reflux K21.9  Nocturia R35.1  Morning Headaches G44.221  Difficulty concentrating R41.840  Memory problems or poor judgment G31.84  Personality changes or irritability R45.4  Loud snoring R06.83  Depression F32.9  Unrefreshed by sleep G47.8  Impotence N52.9  History of high blood pressure R03.0  Insomnia G47.00  Sleep Disordered Breathing or Sleep Apnea ICD G47.33

## 2022-11-06 NOTE — Patient Instructions (Addendum)
We need to get a better idea of what your blood pressure is running at home. Here are some instructions to follow: - I would recommend using a blood pressure cuff that goes on your arm. The wrist ones can be inaccurate. If you're purchasing one for the first time, try to select one that also reports your heart rate because this can be helpful information as well. - To check your blood pressure, choose a time at least 3 hours after taking your blood pressure medicines. If you can sample it at different times of the day, that's great - it might give you more information about how your blood pressure fluctuates. Remain seated in a chair for 5 minutes quietly beforehand, then check it.  - Please record a list of those readings and call us/send in MyChart message with them for our review in 1 week.   Medication Instructions:  Your physician recommends that you continue on your current medications as directed. Please refer to the Current Medication list given to you today.   Labwork: BMET, Magnesium today  Testing/Procedures: Itamar sleep study  Follow-Up: 6 months Dr.McDowell   Keep appointment tomorrow with Dr.Taylor  Any Other Special Instructions Will Be Listed Below (If Applicable).  If you need a refill on your cardiac medications before your next appointment, please call your pharmacy.

## 2022-11-07 ENCOUNTER — Encounter: Payer: Self-pay | Admitting: Internal Medicine

## 2022-11-07 ENCOUNTER — Ambulatory Visit: Payer: Medicaid Other | Attending: Internal Medicine | Admitting: Internal Medicine

## 2022-11-07 VITALS — BP 130/70 | HR 58 | Ht 71.0 in | Wt 172.2 lb

## 2022-11-07 DIAGNOSIS — I480 Paroxysmal atrial fibrillation: Secondary | ICD-10-CM | POA: Diagnosis not present

## 2022-11-07 NOTE — Progress Notes (Signed)
HPI Richard [name] returns for evaluation of PAF. He is a pleasant middle aged man with a h/o tobacco abuse, HTN, COPD, and sleep apnea (probable but undiagnosed), who has had trouble with PAF for over a year. He was treated with flecainide which did not improve his symptoms but then switched to [facility] and is improved. He has been on toprol as well.  He has had a cath with no obtructive CAD. He is still smoking 5-10 cigs a day but is trying to quit. He denies fever or chills. He has trouble with snoring according to his wife. He is pending dental extraction and sleep study. He had a panic attack last week but was in NSR.  Allergies  Allergen Reactions   Dexilant [Dexlansoprazole] Anaphylaxis   Mushroom Ext Cmplx(Shiitake-Reishi-Mait) Anaphylaxis    Rapid heart rate.   Penicillins Anaphylaxis    Has patient had a PCN reaction causing immediate rash, facial/tongue/throat swelling, SOB or lightheadedness with hypotension: Yes Has patient had a PCN reaction causing severe rash involving mucus membranes or skin necrosis: No Has patient had a PCN reaction that required hospitalization Yes Has patient had a PCN reaction occurring within the last 10 years: No If all of the above answers are "NO", then may proceed with Cephalosporin use.    Doxycycline Nausea And Vomiting          Current Outpatient Medications  Medication Sig Dispense Refill   acetaminophen (TYLENOL) 325 MG tablet Take 650 mg by mouth every 6 (six) hours as needed.     albuterol (VENTOLIN HFA) 108 (90 Base) MCG/ACT inhaler INHALE 2 PUFFS BY MOUTH EVERY 6 HOURS AS NEEDED FOR COUGHING, WHEEZING, OR SHORTNESS OF BREATH 20.1 g 0   ALPRAZolam (XANAX) 0.5 MG tablet Take one twice a day and two at bedtime 120 tablet 2   amLODipine (NORVASC) 10 MG tablet Take 1 tablet (10 mg total) by mouth daily. 90 tablet 0   dronedarone (MULTAQ) 400 MG tablet Take 1 tablet (400 mg total) by mouth 2 (two) times daily with a meal. 180 tablet 3    esomeprazole (NEXIUM) 40 MG capsule Take 1 capsule (40 mg total) by mouth 2 (two) times daily before a meal. 60 capsule 11   famotidine (PEPCID) 20 MG tablet Take 20 mg by mouth at bedtime.     metoprolol succinate (TOPROL XL) 25 MG 24 hr tablet Take 0.5 tablets (12.5 mg total) by mouth daily. 45 tablet 3   nicotine (NICODERM CQ - DOSED IN MG/24 HOURS) 14 mg/24hr patch 14 mg daily.     nicotine (NICODERM CQ - DOSED IN MG/24 HOURS) 21 mg/24hr patch 21 mg daily.     nicotine (NICODERM CQ - DOSED IN MG/24 HR) 7 mg/24hr patch Place 7 mg onto the skin daily.     nitroGLYCERIN (NITROSTAT) 0.4 MG SL tablet Place 1 tablet (0.4 mg total) under the tongue every 5 (five) minutes x 3 doses as needed for chest pain (if no relief after 3rd dose, proceed to the ED for an evaluation). 25 tablet 3   rivaroxaban (XARELTO) 20 MG TABS tablet TAKE 1 TABLET BY MOUTH ONCE DAILY WITH  SUPPER 90 tablet 1   simvastatin (ZOCOR) 20 MG tablet Take 1 tablet by mouth once daily 90 tablet 3   vitamin B-12 (CYANOCOBALAMIN) 50 MCG tablet Take 50 mcg by mouth daily.     VITAMIN D PO Take 1 tablet by mouth daily.     No current facility-administered  medications for this visit.     Past Medical History:  Diagnosis Date   Arthritis    Asthma    CAD (coronary artery disease)    a. Cardiac cath 07/2015 showed 65% distal Cx, 20% mid-distal LAD, 20% prox-distal RCA, EF 60%, EDP .   Colitis 1990   COPD (chronic obstructive pulmonary disease)    Depression    Essential hypertension    Gastric ulcer 2003; 2012   2003: + esophagitis; negative H.pylori serology  2012: Dr. Darrick Penna, mild gastritis, Bravo PH probe placement, negative H.pylori   GERD (gastroesophageal reflux disease)    Hepatic steatosis    History of hiatal hernia    Hyperlipemia    Overweight    Panic attacks    Paroxysmal atrial fibrillation    Pulmonary nodules    Stroke    TIA (transient ischemic attack)    Type II diabetes mellitus     ROS:   All  systems reviewed and negative except as noted in the HPI.   Past Surgical History:  Procedure Laterality Date   BALLOON DILATION N/A 07/25/2021   Procedure: BALLOON DILATION;  Surgeon: Lanelle Bal, DO;  [facility];  Service: Endoscopy;  Laterality: N/A;   BIOPSY  07/25/2021   Procedure: BIOPSY;  Surgeon: Lanelle Bal, DO;  [facility];  Service: Endoscopy;;   BRAVO PH STUDY  05/03/2011   ZOX:WRUE gastritis/normal esophagus and duodenum   CARDIAC CATHETERIZATION  1990s X 1; 2005; 08/12/2015   CARDIAC CATHETERIZATION N/A 08/12/2015   Procedure: Left Heart Cath and Coronary Angiography;  Surgeon: Lyn Records, MD; LAD 20%, CFX 65%, RCA 20%, EF 60%    COLONOSCOPY  1990   COLONOSCOPY WITH PROPOFOL N/A 11/21/2016   Dr. Darrick Penna: non-thrombosed external hemorrhoids, one 6 mm polyp (polypoid lesion), internal hemorrhoids. TI Normal. 10 years screening   ESOPHAGOGASTRODUODENOSCOPY  05/03/2011   AVW:UJWJ gastritis   ESOPHAGOGASTRODUODENOSCOPY (EGD) WITH PROPOFOL N/A 07/25/2021   Procedure: ESOPHAGOGASTRODUODENOSCOPY (EGD) WITH PROPOFOL;  Surgeon: Lanelle Bal, DO;  [facility]: [facility];  Service: Endoscopy;  Laterality: N/A;  1:30pm   NECK MASS EXCISION Right    "done in dr's office; behind right ear/side of ncek"   POLYPECTOMY  11/21/2016   Procedure: POLYPECTOMY;  Surgeon: West Bali, MD;  Location: [facility];  Service: Endoscopy;;  descending colon polyp   SHOULDER ARTHROSCOPY W/ ROTATOR CUFF REPAIR Right 2006   acromioclavicular joint arthrosis     Family History  Problem Relation Age of Onset   Lung cancer Mother    Alcohol abuse Mother    Heart attack Father 1   Diabetes Father    Alcohol abuse Father    Hypertension Brother    Hypertension Brother    Anxiety disorder Sister    Depression Sister    Anxiety disorder Sister    Heart attack Brother 53   Diabetes Brother    Hypertension Brother    Seizures Brother    Dementia Paternal  Uncle    Dementia Cousin    ADD / ADHD Daughter    Colon cancer Neg Hx    Drug abuse Neg Hx    Bipolar disorder Neg Hx    OCD Neg Hx    Paranoid behavior Neg Hx    Schizophrenia Neg Hx    Sexual abuse Neg Hx    Physical abuse Neg Hx      Social History   Socioeconomic History   Marital status:  Married    Spouse name: Not on file   Number of children: Not on file   Years of education: Not on file   Highest education level: Not on file  Occupational History   Occupation: full time    Employer: UNEMPLOYED  Tobacco Use   Smoking status: Every Day    Packs/day: 0.50    Years: 25.00    Additional pack years: 0.00    Total pack years: 12.50    Types: Cigarettes    Start date: 07/17/1982   Smokeless tobacco: Never   Tobacco comments:    1/2 pack a day  Vaping Use   Vaping Use: Never used  Substance and Sexual Activity   Alcohol use: No    Alcohol/week: 0.0 standard drinks of alcohol   Drug use: No   Sexual activity: Yes    Birth control/protection: None  Other Topics Concern   Not on file  Social History Narrative   Pt lives in [place] with wife.  5 children.  Unemployed due to panic attacks and back pain   Social Determinants of Health   Financial Resource Strain: Not on file  Food Insecurity: Not on file  Transportation Needs: Not on file  Physical Activity: Not on file  Stress: Not on file  Social Connections: Not on file  Intimate Partner Violence: Not on file     BP 130/70   Pulse (!) 58   Ht  (1.803 m)   Wt 172 lb 3.2 oz (78.1 kg)   SpO2 99%   BMI 24.02 kg/m   Physical Exam:  Well appearing NAD HEENT: Unremarkable Neck:  No JVD, no thyromegally Lymphatics:  No adenopathy Back:  No CVA tenderness Lungs:  Clear HEART:  Regular rate rhythm, no murmurs, no rubs, no clicks Abd:  soft, positive bowel sounds, no organomegally, no rebound, no guarding Ext:  2 plus pulses, no edema, no cyanosis, no clubbing Skin:  No rashes no  nodules Neuro:  CN II through XII intact, motor grossly intact  EKG  DEVICE  Normal device function.  See PaceArt for details.   Assess/Plan:  PAF - He will continue multaq. Appears to be improved though still having atrial fib. I offered him the option of ablation or dofetilide but he wants to continue as he is. HTN - his bp is  controlled. No change in meds. Coags - he has not had bleeding on xarelto. 4.   Tobacco abuse - he is encouraged to go ahead and stop smoking. We will give him nicotine patches    Dorathy Daft

## 2022-11-07 NOTE — Patient Instructions (Signed)
Medication Instructions:  Your physician recommends that you continue on your current medications as directed. Please refer to the Current Medication list given to you today.  *If you need a refill on your cardiac medications before your next appointment, please call your pharmacy*   Lab Work: NONE   If you have labs (blood work) drawn today and your tests are completely normal, you will receive your results only by: MyChart Message (if you have MyChart) OR A paper copy in the mail If you have any lab test that is abnormal or we need to change your treatment, we will call you to review the results.   Testing/Procedures: NONE    Follow-Up: At [facility], you and your health needs are our priority.  As part of our continuing mission to provide you with exceptional heart care, we have created designated Provider Care Teams.  These Care Teams include your primary Cardiologist (physician) and Advanced Practice Providers (APPs -  Physician Assistants and Nurse Practitioners) who all work together to provide you with the care you need, when you need it.  We recommend signing up for the patient portal called "MyChart".  Sign up information is provided on this After Visit Summary.  MyChart is used to connect with patients for Virtual Visits (Telemedicine).  Patients are able to view lab/test results, encounter notes, upcoming appointments, etc.  Non-urgent messages can be sent to your provider as well.   To learn more about what you can do with MyChart, go to [email or web address].    Your next appointment:   6 month(s)  Provider:   [name], MD    Other Instructions Thank you for choosing  [facility]!    

## 2022-11-08 ENCOUNTER — Telehealth (INDEPENDENT_AMBULATORY_CARE_PROVIDER_SITE_OTHER): Payer: Self-pay | Admitting: Internal Medicine

## 2022-11-08 NOTE — Telephone Encounter (Signed)
No auth required for EGD w/Dilation per [facility]

## 2022-11-09 ENCOUNTER — Ambulatory Visit: Payer: Medicaid Other | Admitting: Gastroenterology

## 2022-11-10 ENCOUNTER — Ambulatory Visit (INDEPENDENT_AMBULATORY_CARE_PROVIDER_SITE_OTHER): Payer: Medicaid Other | Admitting: Family Medicine

## 2022-11-10 ENCOUNTER — Telehealth: Payer: Self-pay

## 2022-11-10 ENCOUNTER — Telehealth: Payer: Self-pay | Admitting: *Deleted

## 2022-11-10 ENCOUNTER — Encounter: Payer: Self-pay | Admitting: Family Medicine

## 2022-11-10 VITALS — BP 124/73 | HR 65 | Ht 71.0 in | Wt 170.1 lb

## 2022-11-10 DIAGNOSIS — E782 Mixed hyperlipidemia: Secondary | ICD-10-CM

## 2022-11-10 DIAGNOSIS — R7303 Prediabetes: Secondary | ICD-10-CM | POA: Diagnosis not present

## 2022-11-10 DIAGNOSIS — I482 Chronic atrial fibrillation, unspecified: Secondary | ICD-10-CM | POA: Diagnosis not present

## 2022-11-10 DIAGNOSIS — Z125 Encounter for screening for malignant neoplasm of prostate: Secondary | ICD-10-CM | POA: Diagnosis not present

## 2022-11-10 DIAGNOSIS — R1310 Dysphagia, unspecified: Secondary | ICD-10-CM

## 2022-11-10 DIAGNOSIS — I10 Essential (primary) hypertension: Secondary | ICD-10-CM | POA: Diagnosis not present

## 2022-11-10 NOTE — Assessment & Plan Note (Signed)
He is following up with GI and his last visit was 10/26/2022 Does have a history of dysphagia Complains of having some discomfort in his mid to upper epigastric region of his chest He reports feeling a swelling sensation in his upper chest after eating and drinking Known palpated externally, however he reports that the sensation is internally Does have a history of GERD and is on esomeprazole 40 mg twice daily and famotidine nightly as needed He is scheduled for an upper endoscopy/dilation Dec 04, 2022 If upper endoscopy is negative will consider manometric per GI note Encouraged to continue to follow up with GI and to continue on current treatment regimen

## 2022-11-10 NOTE — Assessment & Plan Note (Signed)
Controlled He takes amlodipine 10 mg daily and metoprolol 12.5 mg daily Asymptomatic today in the clinic Low-sodium diet with increased physical activity encourage BP Readings from Last 3 Encounters:  11/10/22 124/73  11/07/22 130/70  11/06/22 134/62

## 2022-11-10 NOTE — Assessment & Plan Note (Signed)
He is followed up with his cardiologist on 11/06/2022 He is on Xarelto 20 mg daily, Multaq 400 mg twice daily, simvastatin 20 mg daily, and metoprolol 12.5 mg daily Denies symptoms of shortness of breath, palpitation, chest pain, dizziness, and lightheadedness Encouraged to continue treatment regimen

## 2022-11-10 NOTE — Patient Instructions (Addendum)
I appreciate the opportunity to provide care to you today!    Follow up:  3 months  Labs: next visit  Screening: PSA for prostate cancer screening   Please continue to a heart-healthy diet and increase your physical activities. Try to exercise for at least five days a week.      It was a pleasure to see you and I look forward to continuing to work together on your health and well-being. Please do not hesitate to call the office if you need care or have questions about your care.   Have a wonderful day and week. With Gratitude, Gilmore Laroche MSN, FNP-BC

## 2022-11-10 NOTE — Progress Notes (Signed)
Established Patient Office Visit  Subjective:  Patient ID: Richard [name], male    DOB: [birth year]  Age: 57 y.o. MRN: [id number]  CC:  Chief Complaint  Patient presents with   Chronic Care Management    3 month f/u. Pt reports swelling/knot in his chest area from doing yard work yesterday.     HPI Richard [name] is a 57 y.o. male with past medical history of essential hypertension, TIA, GERD, COPD, pulmonary nodules, tendinitis of the right tricep, and hyperglycemia due to diabetes presents for f/u of  chronic medical conditions. For the details of today's visit, please refer to the assessment and plan.   Past Medical History:  Diagnosis Date   Arthritis    Asthma    CAD (coronary artery disease)    a. Cardiac cath 07/2015 showed 65% distal Cx, 20% mid-distal LAD, 20% prox-distal RCA, EF 60%, EDP .   Colitis 1990   COPD (chronic obstructive pulmonary disease) (HCC)    Depression    Essential hypertension    Gastric ulcer 2003; 2012   2003: + esophagitis; negative H.pylori serology  2012: Dr. Darrick Penna, mild gastritis, Bravo PH probe placement, negative H.pylori   GERD (gastroesophageal reflux disease)    Hepatic steatosis    History of hiatal hernia    Hyperlipemia    Overweight    Panic attacks    Paroxysmal atrial fibrillation (HCC)    Pulmonary nodules    Stroke (HCC)    TIA (transient ischemic attack)    Type II diabetes mellitus (HCC)     Past Surgical History:  Procedure Laterality Date   BALLOON DILATION N/A 07/25/2021   Procedure: BALLOON DILATION;  Surgeon: Lanelle Bal, DO;  [facility];  Service: Endoscopy;  Laterality: N/A;   BIOPSY  07/25/2021   Procedure: BIOPSY;  Surgeon: Lanelle Bal, DO;  [facility];  Service: Endoscopy;;   BRAVO PH STUDY  05/03/2011   NWG:NFAO gastritis/normal esophagus and duodenum   CARDIAC CATHETERIZATION  1990s X 1; 2005; 08/12/2015   CARDIAC CATHETERIZATION N/A 08/12/2015   Procedure: Left  Heart Cath and Coronary Angiography;  Surgeon: Lyn Records, MD; LAD 20%, CFX 65%, RCA 20%, EF 60%    COLONOSCOPY  1990   COLONOSCOPY WITH PROPOFOL N/A 11/21/2016   Dr. Darrick Penna: non-thrombosed external hemorrhoids, one 6 mm polyp (polypoid lesion), internal hemorrhoids. TI Normal. 10 years screening   ESOPHAGOGASTRODUODENOSCOPY  05/03/2011   ZHY:QMVH gastritis   ESOPHAGOGASTRODUODENOSCOPY (EGD) WITH PROPOFOL N/A 07/25/2021   Procedure: ESOPHAGOGASTRODUODENOSCOPY (EGD) WITH PROPOFOL;  Surgeon: Lanelle Bal, DO;  [facility]: [facility];  Service: Endoscopy;  Laterality: N/A;  1:30pm   NECK MASS EXCISION Right    "done in dr's office; behind right ear/side of ncek"   POLYPECTOMY  11/21/2016   Procedure: POLYPECTOMY;  Surgeon: West Bali, MD;  Location: [facility];  Service: Endoscopy;;  descending colon polyp   SHOULDER ARTHROSCOPY W/ ROTATOR CUFF REPAIR Right 2006   acromioclavicular joint arthrosis    Family History  Problem Relation Age of Onset   Lung cancer Mother    Alcohol abuse Mother    Heart attack Father 44   Diabetes Father    Alcohol abuse Father    Hypertension Brother    Hypertension Brother    Anxiety disorder Sister    Depression Sister    Anxiety disorder Sister    Heart attack Brother 16   Diabetes Brother    Hypertension  Brother    Seizures Brother    Dementia Paternal Uncle    Dementia Cousin    ADD / ADHD Daughter    Colon cancer Neg Hx    Drug abuse Neg Hx    Bipolar disorder Neg Hx    OCD Neg Hx    Paranoid behavior Neg Hx    Schizophrenia Neg Hx    Sexual abuse Neg Hx    Physical abuse Neg Hx     Social History   Socioeconomic History   Marital status: Married    Spouse name: Not on file   Number of children: Not on file   Years of education: Not on file   Highest education level: Not on file  Occupational History   Occupation: full time    Employer: UNEMPLOYED  Tobacco Use   Smoking status: Every Day    Packs/day: 0.50     Years: 25.00    Additional pack years: 0.00    Total pack years: 12.50    Types: Cigarettes    Start date: 07/17/1982   Smokeless tobacco: Never   Tobacco comments:    1/2 pack a day  Vaping Use   Vaping Use: Never used  Substance and Sexual Activity   Alcohol use: No    Alcohol/week: 0.0 standard drinks of alcohol   Drug use: No   Sexual activity: Yes    Birth control/protection: None  Other Topics Concern   Not on file  Social History Narrative   Pt lives in [place] with wife.  5 children.  Unemployed due to panic attacks and back pain   Social Determinants of Health   Financial Resource Strain: Not on file  Food Insecurity: Not on file  Transportation Needs: Not on file  Physical Activity: Not on file  Stress: Not on file  Social Connections: Not on file  Intimate Partner Violence: Not on file    Outpatient Medications Prior to Visit  Medication Sig Dispense Refill   acetaminophen (TYLENOL) 325 MG tablet Take 650 mg by mouth every 6 (six) hours as needed.     albuterol (VENTOLIN HFA) 108 (90 Base) MCG/ACT inhaler INHALE 2 PUFFS BY MOUTH EVERY 6 HOURS AS NEEDED FOR COUGHING, WHEEZING, OR SHORTNESS OF BREATH 20.1 g 0   ALPRAZolam (XANAX) 0.5 MG tablet Take one twice a day and two at bedtime 120 tablet 2   amLODipine (NORVASC) 10 MG tablet Take 1 tablet (10 mg total) by mouth daily. 90 tablet 0   dronedarone (MULTAQ) 400 MG tablet Take 1 tablet (400 mg total) by mouth 2 (two) times daily with a meal. 180 tablet 3   esomeprazole (NEXIUM) 40 MG capsule Take 1 capsule (40 mg total) by mouth 2 (two) times daily before a meal. 60 capsule 11   famotidine (PEPCID) 20 MG tablet Take 20 mg by mouth at bedtime.     metoprolol succinate (TOPROL XL) 25 MG 24 hr tablet Take 0.5 tablets (12.5 mg total) by mouth daily. 45 tablet 3   nicotine (NICODERM CQ - DOSED IN MG/24 HOURS) 14 mg/24hr patch 14 mg daily.     nicotine (NICODERM CQ - DOSED IN MG/24 HOURS) 21 mg/24hr patch 21 mg  daily.     nicotine (NICODERM CQ - DOSED IN MG/24 HR) 7 mg/24hr patch Place 7 mg onto the skin daily.     nitroGLYCERIN (NITROSTAT) 0.4 MG SL tablet Place 1 tablet (0.4 mg total) under the tongue every 5 (five) minutes x 3 doses  as needed for chest pain (if no relief after 3rd dose, proceed to the ED for an evaluation). 25 tablet 3   rivaroxaban (XARELTO) 20 MG TABS tablet TAKE 1 TABLET BY MOUTH ONCE DAILY WITH  SUPPER 90 tablet 1   simvastatin (ZOCOR) 20 MG tablet Take 1 tablet by mouth once daily 90 tablet 3   vitamin B-12 (CYANOCOBALAMIN) 50 MCG tablet Take 50 mcg by mouth daily.     VITAMIN D PO Take 1 tablet by mouth daily.     No facility-administered medications prior to visit.    Allergies  Allergen Reactions   Dexilant [Dexlansoprazole] Anaphylaxis   Mushroom Ext Cmplx(Shiitake-Reishi-Mait) Anaphylaxis    Rapid heart rate.   Penicillins Anaphylaxis    Has patient had a PCN reaction causing immediate rash, facial/tongue/throat swelling, SOB or lightheadedness with hypotension: Yes Has patient had a PCN reaction causing severe rash involving mucus membranes or skin necrosis: No Has patient had a PCN reaction that required hospitalization Yes Has patient had a PCN reaction occurring within the last 10 years: No If all of the above answers are "NO", then may proceed with Cephalosporin use.    Doxycycline Nausea And Vomiting         ROS Review of Systems  Constitutional:  Negative for fatigue and fever.  Eyes:  Negative for visual disturbance.  Respiratory:  Negative for chest tightness and shortness of breath.   Cardiovascular:  Negative for chest pain and palpitations.  Neurological:  Negative for dizziness and headaches.      Objective:    Physical Exam HENT:     Head: Normocephalic.     Right Ear: External ear normal.     Left Ear: External ear normal.     Nose: No congestion or rhinorrhea.     Mouth/Throat:     Mouth: Mucous membranes are moist.   Cardiovascular:     Rate and Rhythm: Regular rhythm.     Heart sounds: No murmur heard. Pulmonary:     Effort: No respiratory distress.     Breath sounds: Normal breath sounds.  Neurological:     Mental Status: He is alert.     BP 124/73   Pulse 65   Ht 5\' 11"  (1.803 m)   Wt 170 lb 1.3 oz (77.1 kg)   SpO2 97%   BMI 23.72 kg/m  Wt Readings from Last 3 Encounters:  11/10/22 170 lb 1.3 oz (77.1 kg)  11/07/22 172 lb 3.2 oz (78.1 kg)  11/06/22 171 lb 9.6 oz (77.8 kg)    Lab Results  Component Value Date   TSH 2.450 10/20/2022   Lab Results  Component Value Date   WBC 9.1 10/23/2022   HGB 14.8 10/23/2022   HCT 41.2 10/23/2022   MCV 89.2 10/23/2022   PLT 173 10/23/2022   Lab Results  Component Value Date   NA 140 11/06/2022   K 4.3 11/06/2022   CO2 28 11/06/2022   GLUCOSE 105 (H) 11/06/2022   BUN 17 11/06/2022   CREATININE 0.90 11/06/2022   BILITOT 0.2 (L) 10/23/2022   ALKPHOS 47 10/23/2022   AST 23 10/23/2022   ALT 30 10/23/2022   PROT 6.3 (L) 10/23/2022   ALBUMIN 3.7 10/23/2022   CALCIUM 9.2 11/06/2022   ANIONGAP 7 11/06/2022   EGFR 77 10/20/2022   Lab Results  Component Value Date   CHOL 115 10/20/2022   Lab Results  Component Value Date   HDL 43 10/20/2022   Lab Results  Component  Value Date   LDLCALC 58 10/20/2022   Lab Results  Component Value Date   TRIG 67 10/20/2022   Lab Results  Component Value Date   CHOLHDL 2.7 10/20/2022   Lab Results  Component Value Date   HGBA1C 5.9 (H) 10/20/2022      Assessment & Plan:  Essential hypertension Assessment & Plan: Controlled He takes amlodipine 10 mg daily and metoprolol 12.5 mg daily Asymptomatic today in the clinic Low-sodium diet with increased physical activity encourage BP Readings from Last 3 Encounters:  11/10/22 124/73  11/07/22 130/70  11/06/22 134/62      Atrial fibrillation, chronic (HCC) Assessment & Plan: He is followed up with his cardiologist on 11/06/2022 He is  on Xarelto 20 mg daily, Multaq 400 mg twice daily, simvastatin 20 mg daily, and metoprolol 12.5 mg daily Denies symptoms of shortness of breath, palpitation, chest pain, dizziness, and lightheadedness Encouraged to continue treatment regimen    Dysphagia, unspecified type Assessment & Plan: He is following up with GI and his last visit was 10/26/2022 Does have a history of dysphagia Complains of having some discomfort in his mid to upper epigastric region of his chest He reports feeling a swelling sensation in his upper chest after eating and drinking Known palpated externally, however he reports that the sensation is internally Does have a history of GERD and is on esomeprazole 40 mg twice daily and famotidine nightly as needed He is scheduled for an upper endoscopy/dilation Dec 04, 2022 If upper endoscopy is negative will consider manometric per GI note Encouraged to continue to follow up with GI and to continue on current treatment regimen   Mixed hyperlipidemia Assessment & Plan: Stable on simvastatin 20 mg daily  Lab Results  Component Value Date   CHOL 115 10/20/2022   HDL 43 10/20/2022   LDLCALC 58 10/20/2022   TRIG 67 10/20/2022   CHOLHDL 2.7 10/20/2022       Prostate cancer screening Assessment & Plan: Asymptomatic patient Would like to be screened for prostate cancer Orders placed  Orders: -     PSA, total and free  Prediabetes -     [facility] Diabetes Foot Exam    Follow-up: Return in about 3 months (around 02/09/2023).   Gilmore Laroche, FNP

## 2022-11-10 NOTE — Telephone Encounter (Signed)
Patient stopped by desk and would like to discuss scheduling CT for Lung Cancer Screening and would also like to discuss getting a prescription for Stiolto now that he has medicaid sent over to [facility]  Please call and advise patient. [phone]  Scheduled OV for 01/02/2023 at 11:15 with MW

## 2022-11-10 NOTE — Assessment & Plan Note (Signed)
Asymptomatic patient Would like to be screened for prostate cancer Orders placed

## 2022-11-10 NOTE — Telephone Encounter (Signed)
Pt's wife states the pt forgot to ask you yesterday can he have a special test you had told them about beore EGD since the EGD is scheduled for 5/20. Pt states he had a episode yesterday evening and it feels like something is in his throat or chest. I advised you were not here today and it may be next week  before they hear from [name]. Pt even stress that you can send them a MyChart message.

## 2022-11-10 NOTE — Assessment & Plan Note (Signed)
Stable on simvastatin 20 mg daily  Lab Results  Component Value Date   CHOL 115 10/20/2022   HDL 43 10/20/2022   LDLCALC 58 10/20/2022   TRIG 67 10/20/2022   CHOLHDL 2.7 10/20/2022

## 2022-11-11 LAB — PSA, TOTAL AND FREE
PSA, Free Pct: 44 %
PSA, Free: 0.22 ng/mL
Prostate Specific Ag, Serum: 0.5 ng/mL (ref 0.0–4.0)

## 2022-11-13 ENCOUNTER — Other Ambulatory Visit: Payer: Self-pay

## 2022-11-13 DIAGNOSIS — F17210 Nicotine dependence, cigarettes, uncomplicated: Secondary | ICD-10-CM

## 2022-11-13 DIAGNOSIS — Z87891 Personal history of nicotine dependence: Secondary | ICD-10-CM

## 2022-11-13 NOTE — Telephone Encounter (Signed)
Called and spoke w/ pt he verbalized that he is needing a prescription for [employer] sent to [facility] in [place].   Dr.Wert please advise if you are ok with that being sent?    *Also routing to [facility] nurses to reach out and schedule pt if able.*

## 2022-11-13 NOTE — Telephone Encounter (Signed)
Endoscopy will be the best step for right now.

## 2022-11-13 NOTE — Telephone Encounter (Signed)
Advised the pt that Endoscopy will be best for now

## 2022-11-13 NOTE — Telephone Encounter (Signed)
Both are fine.

## 2022-11-14 ENCOUNTER — Ambulatory Visit (INDEPENDENT_AMBULATORY_CARE_PROVIDER_SITE_OTHER): Payer: Medicaid Other | Admitting: Clinical

## 2022-11-14 DIAGNOSIS — F4001 Agoraphobia with panic disorder: Secondary | ICD-10-CM

## 2022-11-14 DIAGNOSIS — F331 Major depressive disorder, recurrent, moderate: Secondary | ICD-10-CM

## 2022-11-14 DIAGNOSIS — F419 Anxiety disorder, unspecified: Secondary | ICD-10-CM

## 2022-11-14 DIAGNOSIS — F4310 Post-traumatic stress disorder, unspecified: Secondary | ICD-10-CM

## 2022-11-14 NOTE — Progress Notes (Signed)
Virtual Visit via Video Note   I connected with Richard [name] on 11/14/22 at 1:00 PM EST by a video enabled telemedicine application and verified that I am speaking with the correct person using two identifiers.   Location: Patient: Home Provider: Office   I discussed the limitations of evaluation and management by telemedicine and the availability of in person appointments. The patient expressed understanding and agreed to proceed.   THERAPIST PROGRESS NOTE     Session Time: 1:00 PM-1:45 PM   Participation Level: Active   Behavioral Response: Casual and Alert,Anxious   Type of Therapy: Individual Therapy   Treatment Goals addressed: Mood and Anxiety   Interventions: CBT   Summary: Richard [name] is a 57 y.o. male who presents with panic disorder/depression with anxiety/and PTSD . The OPT therapist worked with the patient for his OPT treatment. The OPT therapist utilized Motivational Interviewing to assist in creating therapeutic repore. The patient in the session was engaged and work in collaboration giving feedback about his triggers and symptoms over the past few weeks The patient spoke about difficulty with his breathing more over the past few weeks that may be exasperated by pollen.The patients spoke about upcoming scheduled surgery May 20th for.(ESOPHAGOGASTRODUODENOSCOPY) . The patient additionally spoke about difficulty with eating due to not yet having his dental plate he has a scheduled upcoming appointment through the health department and he recently got the approval from the insurance .The OPT therapist utilized Cognitive Behavioral Therapy through cognitive restructuring as well as worked with the patient on coping strategies to assist in management of symptoms as well as reviewed sleep, eating habits, and general health. The patient continues to work on acceptance of limited mobility based on health conditions.The patients spoke about his ongoing work with smoking cessation  and noted he has only had 2 draws from a cigarette all day today.The OPT therapist overviewed upcoming health appointments as listed in the patients MyChart.   Suicidal/Homicidal: Nowithout intent/plan   Therapist Response:The OPT therapist worked with the patient for the patients scheduled session. The patient was engaged in his session and gave feedback in relation to triggers, symptoms, and behavior responses over the past few weeks. The OPT therapist worked with the patient utilizing an in session Cognitive Behavioral Therapy exercise. The patient was responsive in the session and verbalized, " I keep trying to explain its like I have a allergic reaction to something in my chest and if I eat anything spicy it flared it up its like a few weeks ago we went to the [employer] and ate and I started to feel a change in my chest and it felt like my heart beat was slowing down and I checked my 02 level and my pulse level was low and I started having an anxiety attack and it it sent me to the hospital". The patient spoke about noticing he is having the feeling in his chest more frequently but he didn't get any answers from his ED visit and feels like he does not have the answer to why this happens and feel like there is some kind of lung / chest issue. The OPT therapist worked with the patient in this session providing support and reviewing with the patient and placing emphasis on the patient working to remain active while using his judgement with the approval of his physican as the patient has cardiac health condition and utilizing his support symptom. The patient spoke about having recently to take more Xanax to help  with his anxiety from his health condition. The OPT therapist worked with the patient on coping strategies to assist in management of his mood and anxiety.The OPT therapist will continue treatment work with the patient in his next scheduled session   Plan: Return again in 2/3 weeks.   Diagnosis:       Axis I: PTSD/Panic Disorder/ Depression  Axis II: No diagnosis      Collaboration of Care: No additional collaboration of care for this session.   Patient/Guardian was advised Release of Information must be obtained prior to any record release in order to collaborate their care with an outside provider. Patient/Guardian was advised if they have not already done so to contact the registration department to sign all necessary forms in order for [name] to release information regarding their care.    Consent: Patient/Guardian gives verbal consent for treatment and assignment of benefits for services provided during this visit. Patient/Guardian expressed understanding and agreed to proceed.    I discussed the assessment and treatment plan with the patient. The patient was provided an opportunity to ask questions and all were answered. The patient agreed with the plan and demonstrated an understanding of the instructions.   The patient was advised to call back or seek an in-person evaluation if the symptoms worsen or if the condition fails to improve as anticipated.   I provided 45 minutes of non-face-to-face time during this encounter.   Richard Burn, LCSW   11/14/2022

## 2022-11-15 DIAGNOSIS — Z419 Encounter for procedure for purposes other than remedying health state, unspecified: Secondary | ICD-10-CM | POA: Diagnosis not present

## 2022-11-16 ENCOUNTER — Encounter (HOSPITAL_COMMUNITY): Payer: Self-pay | Admitting: Psychiatry

## 2022-11-16 ENCOUNTER — Telehealth (INDEPENDENT_AMBULATORY_CARE_PROVIDER_SITE_OTHER): Payer: Medicaid Other | Admitting: Psychiatry

## 2022-11-16 ENCOUNTER — Other Ambulatory Visit: Payer: Medicaid Other

## 2022-11-16 ENCOUNTER — Telehealth (HOSPITAL_COMMUNITY): Payer: Medicaid Other | Admitting: Psychiatry

## 2022-11-16 DIAGNOSIS — F4310 Post-traumatic stress disorder, unspecified: Secondary | ICD-10-CM | POA: Diagnosis not present

## 2022-11-16 DIAGNOSIS — F4001 Agoraphobia with panic disorder: Secondary | ICD-10-CM

## 2022-11-16 MED ORDER — ALPRAZOLAM 0.5 MG PO TABS: ORAL_TABLET | ORAL | 2 refills | Status: DC

## 2022-11-16 NOTE — Progress Notes (Signed)
Virtual Visit via Video Note  I connected with Richard [name] on 11/16/22 at  1:40 PM EDT by a video enabled telemedicine application and verified that I am speaking with the correct person using two identifiers.  Location: Patient: home Provider: office   I discussed the limitations of evaluation and management by telemedicine and the availability of in person appointments. The patient expressed understanding and agreed to proceed.      I discussed the assessment and treatment plan with the patient. The patient was provided an opportunity to ask questions and all were answered. The patient agreed with the plan and demonstrated an understanding of the instructions.   The patient was advised to call back or seek an in-person evaluation if the symptoms worsen or if the condition fails to improve as anticipated.  I provided 15 minutes of non-face-to-face time during this encounter.   Diannia Ruder, MD  [facility] MD/PA/NP OP Progress Note  11/16/2022 1:57 PM Richard [name]  MRN:  [id number]  Chief Complaint:  Chief Complaint  Patient presents with   Anxiety   Follow-up   HPI: This patient is a 57 year old white male who lives with his wife in [place].  He is unemployed.  The patient returns for follow-up after 3 months regarding his anxiety.  For the most part he has been doing okay.  He was seen in the emergency room last month because of chest pain which then proceeded to be a panic attack.  He states for the most part he is compliant with his Xanax and it does help his anxiety.  The chest pain was not deemed to be cardiac.  He is followed closely by cardiology for the atrial fibrillation.  He is sleeping well and denies significant depression or thoughts of self-harm or suicidal ideation.  Overall he finds the Xanax to really help with his anxiety. Visit Diagnosis:    ICD-10-CM   1. PTSD (post-traumatic stress disorder)  F43.10     2. 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 Cardiac cath 07/2015 showed 65% distal Cx, 20% mid-distal LAD, 20% prox-distal RCA, EF 60%, EDP .   Colitis 1990   COPD (chronic obstructive pulmonary disease) (HCC)    Depression    Essential hypertension    Gastric ulcer 2003; 2012   2003: + esophagitis; negative H.pylori serology  2012: Dr. Darrick Penna, mild gastritis, Bravo PH probe placement, negative H.pylori   GERD (gastroesophageal reflux disease)    Hepatic steatosis    History of hiatal hernia    Hyperlipemia    Overweight    Panic attacks    Paroxysmal atrial fibrillation (HCC)    Pulmonary nodules    Stroke (HCC)    TIA (transient ischemic attack)    Type II diabetes mellitus (HCC)     Past Surgical History:  Procedure Laterality Date   BALLOON DILATION N/A 07/25/2021   Procedure: BALLOON DILATION;  Surgeon: Lanelle Bal, DO;  [facility];  Service: Endoscopy;  Laterality: N/A;   BIOPSY  07/25/2021   Procedure: BIOPSY;  Surgeon: Lanelle Bal, DO;  [facility];  Service: Endoscopy;;   BRAVO PH STUDY  05/03/2011   WUJ:WJXB gastritis/normal esophagus and duodenum   CARDIAC CATHETERIZATION  1990s X 1; 2005; 08/12/2015   CARDIAC CATHETERIZATION N/A 08/12/2015   Procedure: Left Heart Cath and Coronary Angiography;  Surgeon: Lyn Records, MD; LAD 20%, CFX 65%, RCA 20%, EF 60%    COLONOSCOPY  1990   COLONOSCOPY WITH PROPOFOL N/A 11/21/2016   Dr. Darrick Penna: non-thrombosed external hemorrhoids, one 6 mm polyp (polypoid lesion), internal hemorrhoids. TI Normal. 10 years screening   ESOPHAGOGASTRODUODENOSCOPY  05/03/2011   GNF:AOZH gastritis   ESOPHAGOGASTRODUODENOSCOPY (EGD) WITH PROPOFOL N/A 07/25/2021   Procedure: ESOPHAGOGASTRODUODENOSCOPY (EGD) WITH PROPOFOL;  Surgeon: Lanelle Bal, DO;  [facility];   Service: Endoscopy;  Laterality: N/A;  1:30pm   NECK MASS EXCISION Right    "done in dr's office; behind right ear/side of ncek"   POLYPECTOMY  11/21/2016   Procedure: POLYPECTOMY;  Surgeon: West Bali, MD;  [facility];  Service: Endoscopy;;  descending colon polyp   SHOULDER ARTHROSCOPY W/ ROTATOR CUFF REPAIR Right 2006   acromioclavicular joint arthrosis    Family Psychiatric History: See below  Family History:  Family History  Problem Relation Age of Onset   Lung cancer Mother    Alcohol abuse Mother    Heart attack Father 67   Diabetes Father    Alcohol abuse Father    Hypertension Brother    Hypertension Brother    Anxiety disorder Sister    Depression Sister    Anxiety disorder Sister    Heart attack Brother 73   Diabetes Brother    Hypertension Brother    Seizures Brother    Dementia Paternal Uncle    Dementia Cousin    ADD / ADHD Daughter    Colon cancer Neg Hx    Drug abuse Neg Hx    Bipolar disorder Neg Hx    OCD Neg Hx    Paranoid behavior Neg Hx    Schizophrenia Neg Hx    Sexual abuse Neg Hx    Physical abuse Neg Hx     Social History:  Social History   Socioeconomic History   Marital status: Married    Spouse name: Not on file   Number of children: Not on file   Years of education: Not on file   Highest education level: Not on file  Occupational History   Occupation: full time    Employer: UNEMPLOYED  Tobacco Use   Smoking status: Every Day    Packs/day: 0.50    Years: 25.00    Additional pack years: 0.00    Total pack years: 12.50    Types: Cigarettes    Start date: 07/17/1982   Smokeless tobacco: Never   Tobacco comments:    1/2 pack a day  Vaping Use   Vaping Use: Never used  Substance and Sexual Activity   Alcohol use: No    Alcohol/week: 0.0 standard drinks of alcohol   Drug use: No   Sexual activity: Yes    Birth control/protection: None  Other Topics Concern   Not on file  Social History Narrative   Pt lives  in [place] with wife.  5 children.  Unemployed due to panic attacks and back pain   Social Determinants of Health   Financial Resource Strain: Not on file  Food Insecurity: Not on file  Transportation Needs: Not on file  Physical Activity: Not on file  Stress: Not on file  Social Connections: Not on file    Allergies:  Allergies  Allergen Reactions   Dexilant [Dexlansoprazole] Anaphylaxis  Mushroom Ext Cmplx(Shiitake-Reishi-Mait) Anaphylaxis    Rapid heart rate.   Penicillins Anaphylaxis    Has patient had a PCN reaction causing immediate rash, facial/tongue/throat swelling, SOB or lightheadedness with hypotension: Yes Has patient had a PCN reaction causing severe rash involving mucus membranes or skin necrosis: No Has patient had a PCN reaction that required hospitalization Yes Has patient had a PCN reaction occurring within the last 10 years: No If all of the above answers are "NO", then may proceed with Cephalosporin use.    Doxycycline Nausea And Vomiting         Metabolic Disorder Labs: Lab Results  Component Value Date   HGBA1C 5.9 (H) 10/20/2022   MPG 111.15 02/16/2022   MPG 114.02 11/09/2021   No results found for: "PROLACTIN" Lab Results  Component Value Date   CHOL 115 10/20/2022   TRIG 67 10/20/2022   HDL 43 10/20/2022   CHOLHDL 2.7 10/20/2022   VLDL 8 06/13/2022   LDLCALC 58 10/20/2022   LDLCALC 73 06/13/2022   Lab Results  Component Value Date   TSH 2.450 10/20/2022   TSH 1.876 11/09/2021    Therapeutic Level Labs: No results found for: "LITHIUM" No results found for: "VALPROATE" No results found for: "CBMZ"  Current Medications: Current Outpatient Medications  Medication Sig Dispense Refill   acetaminophen (TYLENOL) 325 MG tablet Take 650 mg by mouth every 6 (six) hours as needed.     albuterol (VENTOLIN HFA) 108 (90 Base) MCG/ACT inhaler INHALE 2 PUFFS BY MOUTH EVERY 6 HOURS AS NEEDED FOR COUGHING, WHEEZING, OR SHORTNESS OF BREATH  20.1 g 0   ALPRAZolam (XANAX) 0.5 MG tablet Take one twice a day and two at bedtime 120 tablet 2   amLODipine (NORVASC) 10 MG tablet Take 1 tablet (10 mg total) by mouth daily. 90 tablet 0   dronedarone (MULTAQ) 400 MG tablet Take 1 tablet (400 mg total) by mouth 2 (two) times daily with a meal. 180 tablet 3   esomeprazole (NEXIUM) 40 MG capsule Take 1 capsule (40 mg total) by mouth 2 (two) times daily before a meal. 60 capsule 11   famotidine (PEPCID) 20 MG tablet Take 20 mg by mouth at bedtime.     metoprolol succinate (TOPROL XL) 25 MG 24 hr tablet Take 0.5 tablets (12.5 mg total) by mouth daily. 45 tablet 3   nicotine (NICODERM CQ - DOSED IN MG/24 HOURS) 14 mg/24hr patch 14 mg daily.     nicotine (NICODERM CQ - DOSED IN MG/24 HOURS) 21 mg/24hr patch 21 mg daily.     nicotine (NICODERM CQ - DOSED IN MG/24 HR) 7 mg/24hr patch Place 7 mg onto the skin daily.     nitroGLYCERIN (NITROSTAT) 0.4 MG SL tablet Place 1 tablet (0.4 mg total) under the tongue every 5 (five) minutes x 3 doses as needed for chest pain (if no relief after 3rd dose, proceed to the ED for an evaluation). 25 tablet 3   rivaroxaban (XARELTO) 20 MG TABS tablet TAKE 1 TABLET BY MOUTH ONCE DAILY WITH  SUPPER 90 tablet 1   simvastatin (ZOCOR) 20 MG tablet Take 1 tablet by mouth once daily 90 tablet 3   vitamin B-12 (CYANOCOBALAMIN) 50 MCG tablet Take 50 mcg by mouth daily.     VITAMIN D PO Take 1 tablet by mouth daily.     No current facility-administered medications for this visit.     Musculoskeletal: Strength & Muscle Tone: within normal limits Gait & Station: normal Patient leans:  N/A  Psychiatric Specialty Exam: Review of Systems  Psychiatric/Behavioral:  The patient is nervous/anxious.   All other systems reviewed and are negative.   There were no vitals taken for this visit.There is no height or weight on file to calculate BMI.  General Appearance: Casual and Fairly Groomed  Eye Contact:  Good  Speech:  Clear  and Coherent  Volume:  Normal  Mood:  Anxious and Euthymic  Affect:  Congruent  Thought Process:  Goal Directed  Orientation:  Full (Time, Place, and Person)  Thought Content: WDL   Suicidal Thoughts:  No  Homicidal Thoughts:  No  Memory:  Immediate;   Good Recent;   Good Remote;   NA  Judgement:  Good  Insight:  Fair  Psychomotor Activity:  Decreased  Concentration:  Concentration: Good and Attention Span: Good  Recall:  Good  Fund of Knowledge: Good  Language: Good  Akathisia:  No  Handed:  Right  AIMS (if indicated): not done  Assets:  Communication Skills Desire for Improvement Resilience Social Support  ADL's:  Intact  Cognition: WNL  Sleep:  Good   Screenings: GAD-7    Flowsheet Row Office Visit from 11/10/2022 in [facility] Office Visit from 08/04/2022 in [facility] Primary [facility] Counselor from 08/24/2021 in [facility] Out[facility] at [place]  Total GAD-7 Score 13 8 20       PHQ2-9    Flowsheet Row Office Visit from 11/10/2022 in [facility] Office Visit from 08/04/2022 in [facility] Primary [facility] Video Visit from 11/29/2021 in [facility] at [place] Video Visit from 09/06/2021 in [facility] Outpatient [facility] at [place] Counselor from 08/24/2021 in [facility]patient Behavioral [facility] at [facility] Total Score 3 4 1 5 4   PHQ-9 Total Score 10 12 -- 12 16      Flowsheet Row ED from 10/23/2022 in [facility] Department at [facility] Video Visit from 11/29/2021 in [facility] Outpatient Behavioral Health at [place] ED from 09/27/2021 in [facility] at [place]  C-SSRS RISK CATEGORY No Risk No Risk No Risk        Assessment and Plan: This patient is a 57 year old male with a history of anxiety and panic attacks.  We have discussed trying SSRIs many times but they always seem to make him worse.   For now he would like to continue with the Xanax 0.5 mg twice daily and 1 mg at bedtime.  He will return to see me in 3 months  Collaboration of Care: Collaboration of Care: Primary Care Provider AEB notes are shared with PCP on the epic system  Patient/Guardian was advised Release of Information must be obtained prior to any record release in order to collaborate their care with an outside provider. Patient/Guardian was advised if they have not already done so to contact the registration department to sign all necessary forms in order for [name] to release information regarding their care.   Consent: Patient/Guardian gives verbal consent for treatment and assignment of benefits for services provided during this visit. Patient/Guardian expressed understanding and agreed to proceed.    Diannia Ruder, MD 11/16/2022, 1:57 PM

## 2022-11-17 ENCOUNTER — Other Ambulatory Visit: Payer: Self-pay

## 2022-11-17 MED ORDER — STIOLTO RESPIMAT 2.5-2.5 MCG/ACT IN AERS: 2.0000 | INHALATION_SPRAY | Freq: Every day | RESPIRATORY_TRACT | 4 refills | Status: DC

## 2022-11-17 NOTE — Telephone Encounter (Signed)
ATC pt LVM letting him know I sent the prescription for Stiolto to the [facility].

## 2022-11-20 ENCOUNTER — Telehealth: Payer: Self-pay | Admitting: *Deleted

## 2022-11-20 NOTE — Telephone Encounter (Signed)
Called and notified pt of pin number.

## 2022-11-20 NOTE — Telephone Encounter (Signed)
Richard [name] was ordered in the [place] office. I will forward to [facility] triage.

## 2022-11-20 NOTE — Telephone Encounter (Signed)
Prior Authorization for [employer] sent to medicaid via web portal. Tracking Number . Approved NO PA REQ.

## 2022-11-24 NOTE — Telephone Encounter (Signed)
Pt contacted and made aware of pre op appt scheduled for 12/01/22 at 9 am.

## 2022-11-30 ENCOUNTER — Encounter (HOSPITAL_COMMUNITY): Payer: Self-pay

## 2022-11-30 ENCOUNTER — Encounter (HOSPITAL_COMMUNITY)
Admission: RE | Admit: 2022-11-30 | Discharge: 2022-11-30 | Disposition: A | Discharge status: Home / Self Care | Payer: Medicaid Other | Source: Ambulatory Visit | Attending: Internal Medicine | Admitting: Internal Medicine

## 2022-11-30 DIAGNOSIS — R4589 Other symptoms and signs involving emotional state: Secondary | ICD-10-CM | POA: Diagnosis not present

## 2022-11-30 DIAGNOSIS — R0789 Other chest pain: Secondary | ICD-10-CM | POA: Diagnosis not present

## 2022-11-30 DIAGNOSIS — R079 Chest pain, unspecified: Secondary | ICD-10-CM | POA: Diagnosis not present

## 2022-11-30 DIAGNOSIS — I959 Hypotension, unspecified: Secondary | ICD-10-CM | POA: Diagnosis not present

## 2022-11-30 DIAGNOSIS — I4891 Unspecified atrial fibrillation: Secondary | ICD-10-CM | POA: Diagnosis not present

## 2022-11-30 HISTORY — DX: Fatty (change of) liver, not elsewhere classified: K76.0

## 2022-12-04 ENCOUNTER — Ambulatory Visit (HOSPITAL_BASED_OUTPATIENT_CLINIC_OR_DEPARTMENT_OTHER): Payer: Medicaid Other | Admitting: Anesthesiology

## 2022-12-04 ENCOUNTER — Encounter (HOSPITAL_COMMUNITY)
Admission: RE | Disposition: A | Discharge status: Home / Self Care | Payer: Self-pay | Source: Home / Self Care | Attending: Internal Medicine

## 2022-12-04 ENCOUNTER — Ambulatory Visit (HOSPITAL_COMMUNITY)
Admission: RE | Admit: 2022-12-04 | Discharge: 2022-12-04 | Disposition: A | Discharge status: Home / Self Care | Payer: Medicaid Other | Attending: Internal Medicine | Admitting: Internal Medicine

## 2022-12-04 ENCOUNTER — Ambulatory Visit (HOSPITAL_COMMUNITY): Payer: Medicaid Other | Admitting: Anesthesiology

## 2022-12-04 ENCOUNTER — Encounter (HOSPITAL_COMMUNITY): Payer: Self-pay

## 2022-12-04 ENCOUNTER — Telehealth: Payer: Self-pay

## 2022-12-04 ENCOUNTER — Other Ambulatory Visit (HOSPITAL_COMMUNITY): Payer: Self-pay

## 2022-12-04 DIAGNOSIS — Z8249 Family history of ischemic heart disease and other diseases of the circulatory system: Secondary | ICD-10-CM | POA: Insufficient documentation

## 2022-12-04 DIAGNOSIS — J449 Chronic obstructive pulmonary disease, unspecified: Secondary | ICD-10-CM | POA: Diagnosis not present

## 2022-12-04 DIAGNOSIS — R1310 Dysphagia, unspecified: Secondary | ICD-10-CM | POA: Diagnosis not present

## 2022-12-04 DIAGNOSIS — K2970 Gastritis, unspecified, without bleeding: Secondary | ICD-10-CM | POA: Insufficient documentation

## 2022-12-04 DIAGNOSIS — F17210 Nicotine dependence, cigarettes, uncomplicated: Secondary | ICD-10-CM | POA: Diagnosis not present

## 2022-12-04 DIAGNOSIS — K449 Diaphragmatic hernia without obstruction or gangrene: Secondary | ICD-10-CM | POA: Diagnosis not present

## 2022-12-04 DIAGNOSIS — Z833 Family history of diabetes mellitus: Secondary | ICD-10-CM | POA: Insufficient documentation

## 2022-12-04 DIAGNOSIS — M1990 Unspecified osteoarthritis, unspecified site: Secondary | ICD-10-CM | POA: Diagnosis not present

## 2022-12-04 DIAGNOSIS — Z8673 Personal history of transient ischemic attack (TIA), and cerebral infarction without residual deficits: Secondary | ICD-10-CM | POA: Diagnosis not present

## 2022-12-04 DIAGNOSIS — Z8711 Personal history of peptic ulcer disease: Secondary | ICD-10-CM | POA: Diagnosis not present

## 2022-12-04 DIAGNOSIS — Z79899 Other long term (current) drug therapy: Secondary | ICD-10-CM | POA: Diagnosis not present

## 2022-12-04 DIAGNOSIS — F419 Anxiety disorder, unspecified: Secondary | ICD-10-CM | POA: Insufficient documentation

## 2022-12-04 DIAGNOSIS — K2289 Other specified disease of esophagus: Secondary | ICD-10-CM

## 2022-12-04 DIAGNOSIS — K219 Gastro-esophageal reflux disease without esophagitis: Secondary | ICD-10-CM | POA: Insufficient documentation

## 2022-12-04 DIAGNOSIS — I480 Paroxysmal atrial fibrillation: Secondary | ICD-10-CM | POA: Insufficient documentation

## 2022-12-04 DIAGNOSIS — J4489 Other specified chronic obstructive pulmonary disease: Secondary | ICD-10-CM | POA: Insufficient documentation

## 2022-12-04 DIAGNOSIS — K224 Dyskinesia of esophagus: Secondary | ICD-10-CM

## 2022-12-04 DIAGNOSIS — F418 Other specified anxiety disorders: Secondary | ICD-10-CM | POA: Diagnosis not present

## 2022-12-04 DIAGNOSIS — I10 Essential (primary) hypertension: Secondary | ICD-10-CM | POA: Diagnosis not present

## 2022-12-04 DIAGNOSIS — E119 Type 2 diabetes mellitus without complications: Secondary | ICD-10-CM | POA: Diagnosis not present

## 2022-12-04 DIAGNOSIS — I2510 Atherosclerotic heart disease of native coronary artery without angina pectoris: Secondary | ICD-10-CM | POA: Diagnosis not present

## 2022-12-04 DIAGNOSIS — R1013 Epigastric pain: Secondary | ICD-10-CM | POA: Diagnosis present

## 2022-12-04 HISTORY — PX: BIOPSY: SHX5522

## 2022-12-04 HISTORY — PX: ESOPHAGOGASTRODUODENOSCOPY (EGD) WITH PROPOFOL: SHX5813

## 2022-12-04 LAB — GLUCOSE, CAPILLARY: Glucose-Capillary: 108 mg/dL — ABNORMAL HIGH (ref 70–99)

## 2022-12-04 SURGERY — ESOPHAGOGASTRODUODENOSCOPY (EGD) WITH PROPOFOL
Anesthesia: General

## 2022-12-04 MED ORDER — PROPOFOL 10 MG/ML IV BOLUS
INTRAVENOUS | Status: DC | PRN
  Administered 2022-12-04: 100 mg via INTRAVENOUS

## 2022-12-04 MED ORDER — LACTATED RINGERS IV SOLN: INTRAVENOUS | Status: DC

## 2022-12-04 MED ORDER — LIDOCAINE HCL (CARDIAC) PF 100 MG/5ML IV SOSY
PREFILLED_SYRINGE | INTRAVENOUS | Status: DC | PRN
  Administered 2022-12-04: 60 mg via INTRATRACHEAL

## 2022-12-04 MED ORDER — LACTATED RINGERS IV SOLN: INTRAVENOUS | Status: DC | PRN

## 2022-12-04 NOTE — Anesthesia Postprocedure Evaluation (Signed)
Anesthesia Post Note  Patient: Richard [name]  Procedure(s) Performed: ESOPHAGOGASTRODUODENOSCOPY (EGD) WITH PROPOFOL BIOPSY  Patient location during evaluation: Phase II Anesthesia Type: General Level of consciousness: awake and alert and oriented Pain management: pain level controlled Vital Signs Assessment: post-procedure vital signs reviewed and stable Respiratory status: spontaneous breathing, nonlabored ventilation and respiratory function stable Cardiovascular status: blood pressure returned to baseline and stable Postop Assessment: no apparent nausea or vomiting Anesthetic complications: no  No notable events documented.   Last Vitals:  Vitals:   12/04/22 0702 12/04/22 0826  BP: 131/73 (!) 103/58  Pulse: (!) 55 (!) 55  Resp: 12 18  Temp: 36.6 C (!) 36.4 C  SpO2: 100% 95%    Last Pain:  Vitals:   12/04/22 0829  PainSc: 0-No pain                 Jenita Rayfield C Watt Geiler

## 2022-12-04 NOTE — Discharge Instructions (Signed)
EGD Discharge instructions Please read the instructions outlined below and refer to this sheet in the next few weeks. These discharge instructions provide you with general information on caring for yourself after you leave the hospital. Your doctor may also give you specific instructions. While your treatment has been planned according to the most current medical practices available, unavoidable complications occasionally occur. If you have any problems or questions after discharge, please call your doctor. ACTIVITY You may resume your regular activity but move at a slower pace for the next 24 hours.  Take frequent rest periods for the next 24 hours.  Walking will help expel (get rid of) the air and reduce the bloated feeling in your abdomen.  No driving for 24 hours (because of the anesthesia (medicine) used during the test).  You may shower.  Do not sign any important legal documents or operate any machinery for 24 hours (because of the anesthesia used during the test).  NUTRITION Drink plenty of fluids.  You may resume your normal diet.  Begin with a light meal and progress to your normal diet.  Avoid alcoholic beverages for 24 hours or as instructed by your caregiver.  MEDICATIONS You may resume your normal medications unless your caregiver tells you otherwise.  WHAT YOU CAN EXPECT TODAY You may experience abdominal discomfort such as a feeling of fullness or "gas" pains.  FOLLOW-UP Your doctor will discuss the results of your test with you.  SEEK IMMEDIATE MEDICAL ATTENTION IF ANY OF THE FOLLOWING OCCUR: Excessive nausea (feeling sick to your stomach) and/or vomiting.  Severe abdominal pain and distention (swelling).  Trouble swallowing.  Temperature over 101 F (37.8 C).  Rectal bleeding or vomiting of blood.    Your EGD revealed mild amount inflammation in your stomach.  I took biopsies of this to rule out infection with a bacteria called H. pylori.  I also took samples of your  esophagus. Await pathology results, my office will contact you.  Your esophagus was wide open.  I did not need to dilate today.  Small bowel appeared normal.  Continue on current medications.  We will consider esophageal manometry to further evaluate your symptoms.  Follow-up in GI clinic in 6 weeks.   I hope you have a great rest of your week!  Hennie Duos. Marletta Lor, D.O. Gastroenterology and Hepatology [facility]

## 2022-12-04 NOTE — Anesthesia Preprocedure Evaluation (Addendum)
Anesthesia Evaluation  Patient identified by MRN, date of birth, ID band Patient awake    Reviewed: Allergy & Precautions, H&P , NPO status , Patient's Chart, lab work & pertinent test results, reviewed documented beta blocker date and time   Airway Mallampati: II  TM Distance: >3 FB Neck ROM: Full    Dental  (+) Edentulous Upper, Missing, Dental Advisory Given   Pulmonary asthma , COPD,  COPD inhaler, Current Smoker and Patient abstained from smoking.   Pulmonary exam normal breath sounds clear to auscultation       Cardiovascular Exercise Tolerance: Good hypertension, Pt. on home beta blockers and Pt. on medications + CAD and + DOE  + dysrhythmias Atrial Fibrillation  Rhythm:Regular Rate:Normal     Neuro/Psych  PSYCHIATRIC DISORDERS Anxiety Depression    TIACVA, Residual Symptoms    GI/Hepatic Neg liver ROS, hiatal hernia, PUD,GERD  Medicated and Controlled,,  Endo/Other  diabetes, Well Controlled, Type 2, Oral Hypoglycemic Agents    Renal/GU negative Renal ROS  negative genitourinary   Musculoskeletal  (+) Arthritis , Osteoarthritis,    Abdominal   Peds negative pediatric ROS (+)  Hematology negative hematology ROS (+)   Anesthesia Other Findings   Reproductive/Obstetrics negative OB ROS                             Anesthesia Physical Anesthesia Plan  ASA: 3  Anesthesia Plan: General   Post-op Pain Management: Minimal or no pain anticipated   Induction: Intravenous  PONV Risk Score and Plan: 1 and Propofol infusion  Airway Management Planned: Nasal Cannula and Natural Airway  Additional Equipment:   Intra-op Plan:   Post-operative Plan:   Informed Consent: I have reviewed the patients History and Physical, chart, labs and discussed the procedure including the risks, benefits and alternatives for the proposed anesthesia with the patient or authorized representative who has  indicated his/her understanding and acceptance.     Dental advisory given  Plan Discussed with: CRNA and Surgeon  Anesthesia Plan Comments:        Anesthesia Quick Evaluation

## 2022-12-04 NOTE — Telephone Encounter (Signed)
Pharmacy Patient Advocate Encounter   Received notification from [facility] MEDICAID that prior authorization for MULTAQ is needed.    PA submitted on 12/04/22 Key [id number] Status is pending  Haze Rushing, CPhT Pharmacy Patient Advocate Specialist Direct Number: [phone] Fax: [phone]

## 2022-12-04 NOTE — H&P (Signed)
Primary Care Physician:  Gilmore Laroche, FNP Primary Gastroenterologist:  Dr. Marletta Lor  Pre-Procedure History & Physical: HPI:  Richard [name] is a 57 y.o. male is here  for an EGD with possible dilation to be performed for dysphagia, GERD.   Past Medical History:  Diagnosis Date   Arthritis    Asthma    CAD (coronary artery disease)    a. Cardiac cath 07/2015 showed 65% distal Cx, 20% mid-distal LAD, 20% prox-distal RCA, EF 60%, EDP .   Colitis 1990   COPD (chronic obstructive pulmonary disease) (HCC)    Depression    Dysrhythmia    Essential hypertension    Fatty liver    Gastric ulcer 2003; 2012   2003: + esophagitis; negative H.pylori serology  2012: Dr. Darrick Penna, mild gastritis, Bravo PH probe placement, negative H.pylori   GERD (gastroesophageal reflux disease)    Hepatic steatosis    History of hiatal hernia    Hyperlipemia    Overweight    Panic attacks    Paroxysmal atrial fibrillation (HCC)    Pulmonary nodules    Stroke (HCC)    TIA (transient ischemic attack)    Type II diabetes mellitus (HCC)     Past Surgical History:  Procedure Laterality Date   BALLOON DILATION N/A 07/25/2021   Procedure: BALLOON DILATION;  Surgeon: Lanelle Bal, DO;  [facility];  Service: Endoscopy;  Laterality: N/A;   BIOPSY  07/25/2021   Procedure: BIOPSY;  Surgeon: Lanelle Bal, DO;  [facility];  Service: Endoscopy;;   BRAVO PH STUDY  05/03/2011   ZOX:WRUE gastritis/normal esophagus and duodenum   CARDIAC CATHETERIZATION  1990s X 1; 2005; 08/12/2015   CARDIAC CATHETERIZATION N/A 08/12/2015   Procedure: Left Heart Cath and Coronary Angiography;  Surgeon: Lyn Records, MD; LAD 20%, CFX 65%, RCA 20%, EF 60%    COLONOSCOPY  1990   COLONOSCOPY WITH PROPOFOL N/A 11/21/2016   Dr. Darrick Penna: non-thrombosed external hemorrhoids, one 6 mm polyp (polypoid lesion), internal hemorrhoids. TI Normal. 10 years screening   ESOPHAGOGASTRODUODENOSCOPY  05/03/2011    AVW:UJWJ gastritis   ESOPHAGOGASTRODUODENOSCOPY (EGD) WITH PROPOFOL N/A 07/25/2021   Procedure: ESOPHAGOGASTRODUODENOSCOPY (EGD) WITH PROPOFOL;  Surgeon: Lanelle Bal, DO;  [facility]: [facility];  Service: Endoscopy;  Laterality: N/A;  1:30pm   NECK MASS EXCISION Right    "done in dr's office; behind right ear/side of ncek"   POLYPECTOMY  11/21/2016   Procedure: POLYPECTOMY;  Surgeon: West Bali, MD;  Location: [facility];  Service: Endoscopy;;  descending colon polyp   SHOULDER ARTHROSCOPY W/ ROTATOR CUFF REPAIR Right 2006   acromioclavicular joint arthrosis    Prior to Admission medications   Medication Sig Start Date End Date Taking? Authorizing Provider  acidophilus (RISAQUAD) CAPS capsule Take 1 capsule by mouth daily.   Yes [provider]  ALPRAZolam Prudy Feeler) 0.5 MG tablet Take one twice a day and two at bedtime 11/16/22  Yes Myrlene Broker, MD  amLODipine (NORVASC) 10 MG tablet Take 1 tablet (10 mg total) by mouth daily. 09/14/22  Yes Jonelle Sidle, MD  dronedarone (MULTAQ) 400 MG tablet Take 1 tablet (400 mg total) by mouth 2 (two) times daily with a meal. 07/26/22 07/27/23 Yes Marinus Maw, MD  esomeprazole (NEXIUM) 40 MG capsule Take 1 capsule (40 mg total) by mouth 2 (two) times daily before a meal. 11/17/21 11/29/22 Yes Fender Herder K, DO  famotidine (PEPCID) 20 MG tablet Take 20  mg by mouth at bedtime.   Yes [provider]  metoprolol succinate (TOPROL XL) 25 MG 24 hr tablet Take 0.5 tablets (12.5 mg total) by mouth daily. 07/28/22  Yes Jonelle Sidle, MD  rivaroxaban (XARELTO) 20 MG TABS tablet TAKE 1 TABLET BY MOUTH ONCE DAILY WITH  SUPPER 09/14/22  Yes Jonelle Sidle, MD  simvastatin (ZOCOR) 20 MG tablet Take 1 tablet by mouth once daily 07/14/22  Yes Marinus Maw, MD  Tiotropium Bromide-Olodaterol (STIOLTO RESPIMAT) 2.5-2.5 MCG/ACT AERS Inhale 2 puffs into the lungs daily. 11/17/22  Yes Nyoka Cowden, MD  vitamin B-12 (CYANOCOBALAMIN)  50 MCG tablet Take 50 mcg by mouth daily.   Yes [provider]  VITAMIN D PO Take 1 tablet by mouth daily.   Yes [provider]  acetaminophen (TYLENOL) 325 MG tablet Take 650 mg by mouth every 6 (six) hours as needed for moderate pain.    [provider]  albuterol (VENTOLIN HFA) 108 (90 Base) MCG/ACT inhaler INHALE 2 PUFFS BY MOUTH EVERY 6 HOURS AS NEEDED FOR COUGHING, WHEEZING, OR SHORTNESS OF BREATH 06/19/22   Jacquelin Hawking, PA-C  nicotine (NICODERM CQ - DOSED IN MG/24 HOURS) 14 mg/24hr patch Place 14 mg onto the skin daily. 10/26/22   [provider]  nicotine (NICODERM CQ - DOSED IN MG/24 HOURS) 21 mg/24hr patch Place 21 mg onto the skin daily. 10/27/22   [provider]  nicotine (NICODERM CQ - DOSED IN MG/24 HR) 7 mg/24hr patch Place 7 mg onto the skin daily. 10/27/22   [provider]  nitroGLYCERIN (NITROSTAT) 0.4 MG SL tablet Place 1 tablet (0.4 mg total) under the tongue every 5 (five) minutes x 3 doses as needed for chest pain (if no relief after 3rd dose, proceed to the ED for an evaluation). 07/26/22   Marinus Maw, MD    Allergies as of 10/26/2022 - Review Complete 10/26/2022  Allergen Reaction Noted   Dexilant [dexlansoprazole] Anaphylaxis 01/20/2015   Mushroom ext cmplx(shiitake-reishi-mait) Anaphylaxis 03/22/2011   Penicillins Anaphylaxis    Doxycycline Nausea And Vomiting 04/18/2016    Family History  Problem Relation Age of Onset   Lung cancer Mother    Alcohol abuse Mother    Heart attack Father 8   Diabetes Father    Alcohol abuse Father    Hypertension Brother    Hypertension Brother    Anxiety disorder Sister    Depression Sister    Anxiety disorder Sister    Heart attack Brother 38   Diabetes Brother    Hypertension Brother    Seizures Brother    Dementia Paternal Uncle    Dementia Cousin    ADD / ADHD Daughter    Colon cancer Neg Hx    Drug abuse Neg Hx    Bipolar disorder Neg Hx    OCD Neg Hx     Paranoid behavior Neg Hx    Schizophrenia Neg Hx    Sexual abuse Neg Hx    Physical abuse Neg Hx     Social History   Socioeconomic History   Marital status: Married    Spouse name: Not on file   Number of children: Not on file   Years of education: Not on file   Highest education level: Not on file  Occupational History   Occupation: full time    Employer: UNEMPLOYED  Tobacco Use   Smoking status: Every Day    Packs/day: 0.50    Years: 25.00  Additional pack years: 0.00    Total pack years: 12.50    Types: Cigarettes    Start date: 07/17/1982   Smokeless tobacco: Never   Tobacco comments:    1/2 pack a day  Vaping Use   Vaping Use: Never used  Substance and Sexual Activity   Alcohol use: No    Alcohol/week: 0.0 standard drinks of alcohol   Drug use: No   Sexual activity: Yes    Birth control/protection: None  Other Topics Concern   Not on file  Social History Narrative   Pt lives in [place] with wife.  5 children.  Unemployed due to panic attacks and back pain   Social Determinants of Health   Financial Resource Strain: Not on file  Food Insecurity: Not on file  Transportation Needs: Not on file  Physical Activity: Not on file  Stress: Not on file  Social Connections: Not on file  Intimate Partner Violence: Not on file    Review of Systems: General: Negative for fever, chills, fatigue, weakness. Eyes: Negative for vision changes.  ENT: Negative for hoarseness, difficulty swallowing , nasal congestion. CV: Negative for chest pain, angina, palpitations, dyspnea on exertion, peripheral edema.  Respiratory: Negative for dyspnea at rest, dyspnea on exertion, cough, sputum, wheezing.  GI: See history of present illness. GU:  Negative for dysuria, hematuria, urinary incontinence, urinary frequency, nocturnal urination.  MS: Negative for joint pain, low back pain.  Derm: Negative for rash or itching.  Neuro: Negative for weakness, abnormal sensation,  seizure, frequent headaches, memory loss, confusion.  Psych: Negative for anxiety, depression Endo: Negative for unusual weight change.  Heme: Negative for bruising or bleeding. Allergy: Negative for rash or hives.  Physical Exam: Vital signs in last 24 hours: Temp:  [97.8 F (36.6 C)] 97.8 F (36.6 C) (05/20 0702) Pulse Rate:  [55] 55 (05/20 0702) Resp:  [12] 12 (05/20 0702) BP: (131)/(73) 131/73 (05/20 0702) SpO2:  [100 %] 100 % (05/20 0702) Weight:  [76.2 kg] 76.2 kg (05/20 0702)   General:   Alert,  Well-developed, well-nourished, pleasant and cooperative in NAD Head:  Normocephalic and atraumatic. Eyes:  Sclera clear, no icterus.   Conjunctiva pink. Ears:  Normal auditory acuity. Nose:  No deformity, discharge,  or lesions. Msk:  Symmetrical without gross deformities. Normal posture. Extremities:  Without clubbing or edema. Neurologic:  Alert and  oriented x4;  grossly normal neurologically. Skin:  Intact without significant lesions or rashes. Psych:  Alert and cooperative. Normal mood and affect.   Impression/Plan: Richard [name] is here for an EGD with possible dilation to be performed for dysphagia, GERD.   Risks, benefits, limitations, imponderables and alternatives regarding EGD have been reviewed with the patient. Questions have been answered. All parties agreeable.

## 2022-12-04 NOTE — Op Note (Signed)
[facility] Patient Name: Richard [name] Procedure Date: 12/04/2022 8:06 AM MRN: [id number] Date of Birth: 1965-11-18 Attending MD: Hennie Duos. Marletta Lor , [place], [id number] CSN: [id number] Age: 57 Admit Type: Outpatient Procedure:                Upper GI endoscopy Indications:              Epigastric abdominal pain, Dysphagia Providers:                Hennie Duos. Marletta Lor, DO, Buel Ream. Thomasena Edis RN, RN,                            Lennice Sites Technician, Technician Referring MD:              Medicines:                See the Anesthesia note for documentation of the                            administered medications Complications:            No immediate complications. Estimated Blood Loss:     Estimated blood loss was minimal. Procedure:                Pre-Anesthesia Assessment:                           - The anesthesia plan was to use monitored                            anesthesia care (MAC).                           After obtaining informed consent, the endoscope was                            passed under direct vision. Throughout the                            procedure, the patient's blood pressure, pulse, and                            oxygen saturations were monitored continuously. The                            GIF-H190 ([id number]) scope was introduced through the                            mouth, and advanced to the second part of duodenum.                            The upper GI endoscopy was accomplished without                            difficulty. The patient tolerated the procedure  well. Scope In: 8:17:39 AM Scope Out: 8:22:50 AM Total Procedure Duration: 0 hours 5 minutes 11 seconds  Findings:      Abnormal motility was noted in the upper third of the esophagus and in       the middle third of the esophagus. The cricopharyngeus was normal. There       is a decrease in motility of the esophageal body. The distal       esophagus/lower esophageal  sphincter is open. ?Presbyesophagus      There were esophageal mucosal changes suspicious for short-segment       Barrett's esophagus present at the gastroesophageal junction. The       maximum longitudinal extent of these mucosal changes was 1 cm in length.       Mucosa was biopsied with a cold forceps for histology. One specimen       bottle was sent to pathology.      Localized mild inflammation characterized by congestion (edema) and       erythema was found in the gastric body. Biopsies were taken with a cold       forceps for Helicobacter pylori testing.      The duodenal bulb, first portion of the duodenum and second portion of       the duodenum were normal.      A small hiatal hernia was present. Impression:               - Abnormal esophageal motility, consistent with                            presbyesophagus.                           - Esophageal mucosal changes suspicious for                            short-segment Barrett's esophagus. Biopsied.                           - Gastritis. Biopsied.                           - Normal duodenal bulb, first portion of the                            duodenum and second portion of the duodenum.                           - Small hiatal hernia. Moderate Sedation:      Per Anesthesia Care Recommendation:           - Patient has a contact number available for                            emergencies. The signs and symptoms of potential                            delayed complications were discussed with the  patient. Return to normal activities tomorrow.                            Written discharge instructions were provided to the                            patient.                           - Resume previous diet.                           - Continue present medications.                           - Await pathology results.                           - Return to GI clinic in 6 weeks.                           -  Consider esophageal manometry Procedure Code(s):        --- Professional ---                           [phone], Esophagogastroduodenoscopy, flexible,                            transoral; with biopsy, single or multiple Diagnosis Code(s):        --- Professional ---                           K22.4, Dyskinesia of esophagus                           K22.89, Other specified disease of esophagus                           K29.70, Gastritis, unspecified, without bleeding                           K44.9, Diaphragmatic hernia without obstruction or                            gangrene                           R10.13, Epigastric pain                           R13.10, Dysphagia, unspecified CPT copyright 2022 American Medical Association. All rights reserved. The codes documented in this report are preliminary and upon coder review may  be revised to meet current compliance requirements. Hennie Duos. Marletta Lor, DO Hennie Duos. Marletta Lor, DO 12/04/2022 8:27:05 AM This report has been signed electronically. Number of Addenda: 0

## 2022-12-04 NOTE — Transfer of Care (Signed)
Immediate Anesthesia Transfer of Care Note  Patient: Richard [name]  Procedure(s) Performed: ESOPHAGOGASTRODUODENOSCOPY (EGD) WITH PROPOFOL BALLOON DILATION BIOPSY  Patient Location: Endoscopy Unit  Anesthesia Type:General  Level of Consciousness: drowsy  Airway & Oxygen Therapy: Patient Spontanous Breathing  Post-op Assessment: Report given to RN and Post -op Vital signs reviewed and stable  Post vital signs: Reviewed and stable  Last Vitals:  Vitals Value Taken Time  BP 103/56   Temp 98   Pulse 55   Resp 16   SpO2 98     Last Pain:  Vitals:   12/04/22 0814  PainSc: 0-No pain         Complications: No notable events documented.

## 2022-12-05 ENCOUNTER — Ambulatory Visit (INDEPENDENT_AMBULATORY_CARE_PROVIDER_SITE_OTHER): Payer: Medicaid Other | Admitting: Internal Medicine

## 2022-12-05 ENCOUNTER — Encounter: Payer: Self-pay | Admitting: Internal Medicine

## 2022-12-05 VITALS — BP 133/79 | HR 56 | Ht 66.0 in | Wt 172.0 lb

## 2022-12-05 DIAGNOSIS — R1013 Epigastric pain: Secondary | ICD-10-CM | POA: Diagnosis not present

## 2022-12-05 DIAGNOSIS — R1084 Generalized abdominal pain: Secondary | ICD-10-CM | POA: Diagnosis not present

## 2022-12-05 DIAGNOSIS — T148XXD Other injury of unspecified body region, subsequent encounter: Secondary | ICD-10-CM

## 2022-12-05 LAB — SURGICAL PATHOLOGY

## 2022-12-05 NOTE — Assessment & Plan Note (Addendum)
Undergoing workup with GI. Review of endoscopy from 5/20 shows abnormal esophageal motility, gastritis and esophagitis biopsied.  Will test for Alpha Gal today.

## 2022-12-05 NOTE — Patient Instructions (Signed)
Thank you, [name] for allowing [name] to provide your care today.   You can try using arnica cream to see if it helps wound heal   I will follow up with results of Alpha Gal Panel    Thurmon Fair, M.D.

## 2022-12-05 NOTE — Assessment & Plan Note (Signed)
Patient has crust on left leg which has not completely healed in 2 years after tick bite. He has scratched it when it bothers him. He has risk factors for PAD. Review of chart shows normal ABI's in 2018. Recommended arnica cream to promote healing or Vaseline if area becomes dry. If not completely healed at next follow up can consider ABI's . Picture of crusted wound in note.

## 2022-12-05 NOTE — Progress Notes (Signed)
   HPI:[name] is a 57 y.o. male who presents for evaluation of Follow-up (Tick bite 2 years ago concerned causing other health issues.) Patient had a tick bite 2 years ago and prescribed doxycycline. He took doxycycline for 3 days but then had nausea/vomiting so he stopped.  He has history of GERD and underwent EGD yesterday. He has bloating and abdominal pain when eating red meat. This started after his tick bite. It was suggested to be tested for Alpha Gal syndrome and he request testing today.   Physical Exam: Vitals:   12/05/22 1341  BP: 133/79  Pulse: (!) 56  SpO2: 96%  Weight: 172 lb (78 kg)  Height: 5\' 6"  (1.676 m)     Physical Exam Constitutional:      General: He is not in acute distress.    Appearance: He is not ill-appearing.  Cardiovascular:     Rate and Rhythm: Regular rhythm. Bradycardia present.     Heart sounds: No murmur heard. Pulmonary:     Effort: Pulmonary effort is normal.     Breath sounds: No wheezing or rales.  Skin:    Comments: Crusted wound , sub centimeter, no surrounding erythema       Assessment & Plan:   Richard [name] was seen today for follow-up.  Delayed wound healing Assessment & Plan: Patient has crust on left leg which has not completely healed in 2 years after tick bite. He has scratched it when it bothers him. He has risk factors for PAD. Review of chart shows normal ABI's in 2018. Recommended arnica cream to promote healing or Vaseline if area becomes dry. If not completely healed at next follow up can consider ABI's . Picture of crusted wound in note.    Dyspepsia Assessment & Plan: Undergoing workup with GI. Review of endoscopy from 5/20 shows abnormal esophageal motility, gastritis and esophagitis biopsied.  Will test for Alpha Gal today.   Orders: -     Alpha-Gal Panel      Milus Banister, MD

## 2022-12-05 NOTE — Telephone Encounter (Signed)
Pharmacy Patient Advocate Encounter  Received notification from [facility] that the request for prior authorization for MULTAQ has been denied due to PLAN REQUIRED FAILURE OF TWO PREFERRED DRUGS FIRST (amiodarone tablet, disopyramide capsule, dofetilide capsule, flecainide tablet, mexiletine capsule, propafenone tablet, propafenone SR capsule, quinidine sulfate tablet)   PLEASE ADVISE   Haze Rushing, CPhT Pharmacy Patient Advocate Specialist Direct Number: [phone] Fax: [phone]

## 2022-12-07 ENCOUNTER — Encounter (HOSPITAL_COMMUNITY): Payer: Self-pay | Admitting: Internal Medicine

## 2022-12-07 ENCOUNTER — Other Ambulatory Visit: Payer: Self-pay | Admitting: Cardiology

## 2022-12-08 ENCOUNTER — Emergency Department (HOSPITAL_COMMUNITY)
Admission: EM | Admit: 2022-12-08 | Discharge: 2022-12-09 | Disposition: A | Discharge status: Home / Self Care | Payer: Medicaid Other | Attending: Emergency Medicine | Admitting: Emergency Medicine

## 2022-12-08 ENCOUNTER — Other Ambulatory Visit: Payer: Self-pay | Admitting: Gastroenterology

## 2022-12-08 ENCOUNTER — Other Ambulatory Visit: Payer: Self-pay

## 2022-12-08 ENCOUNTER — Emergency Department (HOSPITAL_COMMUNITY): Payer: Medicaid Other

## 2022-12-08 ENCOUNTER — Telehealth: Payer: Self-pay | Admitting: Internal Medicine

## 2022-12-08 ENCOUNTER — Encounter (HOSPITAL_COMMUNITY): Payer: Self-pay | Admitting: Emergency Medicine

## 2022-12-08 DIAGNOSIS — Z79899 Other long term (current) drug therapy: Secondary | ICD-10-CM | POA: Diagnosis not present

## 2022-12-08 DIAGNOSIS — R0789 Other chest pain: Secondary | ICD-10-CM | POA: Diagnosis not present

## 2022-12-08 DIAGNOSIS — E119 Type 2 diabetes mellitus without complications: Secondary | ICD-10-CM | POA: Diagnosis not present

## 2022-12-08 DIAGNOSIS — Z7984 Long term (current) use of oral hypoglycemic drugs: Secondary | ICD-10-CM | POA: Diagnosis not present

## 2022-12-08 DIAGNOSIS — I10 Essential (primary) hypertension: Secondary | ICD-10-CM | POA: Diagnosis not present

## 2022-12-08 DIAGNOSIS — I2510 Atherosclerotic heart disease of native coronary artery without angina pectoris: Secondary | ICD-10-CM | POA: Insufficient documentation

## 2022-12-08 DIAGNOSIS — R001 Bradycardia, unspecified: Secondary | ICD-10-CM | POA: Diagnosis not present

## 2022-12-08 DIAGNOSIS — Z7901 Long term (current) use of anticoagulants: Secondary | ICD-10-CM | POA: Diagnosis not present

## 2022-12-08 DIAGNOSIS — R079 Chest pain, unspecified: Secondary | ICD-10-CM | POA: Diagnosis not present

## 2022-12-08 DIAGNOSIS — K219 Gastro-esophageal reflux disease without esophagitis: Secondary | ICD-10-CM

## 2022-12-08 LAB — BASIC METABOLIC PANEL
Anion gap: 13 (ref 5–15)
BUN: 19 mg/dL (ref 6–20)
CO2: 22 mmol/L (ref 22–32)
Calcium: 9 mg/dL (ref 8.9–10.3)
Chloride: 101 mmol/L (ref 98–111)
Creatinine, Ser: 1 mg/dL (ref 0.61–1.24)
GFR, Estimated: >60 mL/min (ref >60)
Glucose, Bld: 168 mg/dL — ABNORMAL HIGH (ref 70–99)
Potassium: 3.4 mmol/L — ABNORMAL LOW (ref 3.5–5.1)
Sodium: 136 mmol/L (ref 135–145)

## 2022-12-08 LAB — CBC
HCT: 41.3 % (ref 39.0–52.0)
Hemoglobin: 14.6 g/dL (ref 13.0–17.0)
MCH: 32.2 pg (ref 26.0–34.0)
MCHC: 35.4 g/dL (ref 30.0–36.0)
MCV: 91.2 fL (ref 80.0–100.0)
Platelets: 173 10*3/uL (ref 150–400)
RBC: 4.53 MIL/uL (ref 4.22–5.81)
RDW: 12 % (ref 11.5–15.5)
WBC: 6.8 10*3/uL (ref 4.0–10.5)
nRBC: 0 % (ref 0.0–0.2)

## 2022-12-08 LAB — TROPONIN I (HIGH SENSITIVITY)
Troponin I (High Sensitivity): <2 ng/L (ref <18)
Troponin I (High Sensitivity): 2 ng/L (ref <18)

## 2022-12-08 LAB — PROTIME-INR
INR: 1.3 — ABNORMAL HIGH (ref 0.8–1.2)
Prothrombin Time: 16.5 seconds — ABNORMAL HIGH (ref 11.4–15.2)

## 2022-12-08 MED ORDER — ACETAMINOPHEN 500 MG PO TABS: 1000.0000 mg | ORAL_TABLET | Freq: Once | ORAL | Status: DC

## 2022-12-08 MED ORDER — ASPIRIN 81 MG PO CHEW
324.0000 mg | CHEWABLE_TABLET | ORAL | Status: AC
  Administered 2022-12-08: 324 mg via ORAL
  Filled 2022-12-08: qty 4

## 2022-12-08 MED ORDER — SUCRALFATE 1 GM/10ML PO SUSP: 1.0000 g | Freq: Three times a day (TID) | ORAL | 0 refills | Status: DC

## 2022-12-08 MED ORDER — NITROGLYCERIN 0.4 MG SL SUBL
0.4000 mg | SUBLINGUAL_TABLET | SUBLINGUAL | Status: DC | PRN
  Administered 2022-12-08 (×2): 0.4 mg via SUBLINGUAL
  Filled 2022-12-08 (×2): qty 1

## 2022-12-08 NOTE — Telephone Encounter (Signed)
Pt called wanting Carafate sent to his pharmacy. He has some burning in his throat. He had a EGD done 4 days ago and I advised them that Dr Marletta Lor has 5 to 10 business days to get the results to him. (And advised Tobi Bastos was not here). Pt is on 2 Ppi medications. Please advise

## 2022-12-08 NOTE — Telephone Encounter (Signed)
Appeals letter faxed to insurance. 

## 2022-12-08 NOTE — Telephone Encounter (Signed)
I have written appeals letter- Dorathy Daft can you send me the info on how to appeal?

## 2022-12-08 NOTE — Telephone Encounter (Signed)
Pt is asking about additional testing and is still having problems. Asking for liquid carafate to be called into [facility] in [place]. Please advise. [phone]

## 2022-12-08 NOTE — ED Triage Notes (Addendum)
Pt via POV w/wife reporting sudden onset chest pain and SOB while shopping at [employer] at about 5:15pm. Pt states he feels like he was "punched in the center of his chest" with radiation to left arm and SOB. He had an endoscopy on Monday. Pt is holding his chest in triage. Pt takes xarelto; hx a fib and previous CVA, cardiac cath but no prior MI.

## 2022-12-08 NOTE — ED Provider Notes (Signed)
[place] EMERGENCY DEPARTMENT AT [facility] Provider Note   CSN: [id number] Arrival date & time: 12/08/22  1748     History {Add pertinent medical, surgical, social history, OB history to HPI:1} Chief Complaint  Patient presents with   Chest Pain    Richard [name] is a 57 y.o. male.  5pm a [employer] chest pain L sided positional  Palpitations sob Pt via POV w/wife reporting sudden onset chest pain and SOB while shopping  at [employer] at about 5:15pm. Pt states he feels like he was "punched in the  center of his chest" and SOB - hsarp no radiation. He had an  endoscopy on Monday. Pt is holding his chest in triage. Pt takes xarelto;  hx a fib and previous CVA, cardiac cath but no prior MI.   No vom or sweats not exertional EGD Monday paused xarelto for it smokes positionaln took some xanax  Past Medical History: No date: Arthritis No date: Asthma No date: CAD (coronary artery disease)     Comment:  a. Cardiac cath 07/2015 showed 65% distal Cx, 20%               mid-distal LAD, 20% prox-distal RCA, EF 60%, EDP . 1990: Colitis No date: COPD (chronic obstructive pulmonary disease) (HCC) No date: Depression No date: Dysrhythmia No date: Essential hypertension No date: Fatty liver 2003; 2012: Gastric ulcer     Comment:  2003: + esophagitis; negative H.pylori serology  2012:               Dr. Darrick Penna, mild gastritis, Bravo PH probe placement,               negative H.pylori No date: GERD (gastroesophageal reflux disease) No date: Hepatic steatosis No date: History of hiatal hernia No date: Hyperlipemia No date: Overweight No date: Panic attacks No date: Paroxysmal atrial fibrillation (HCC) No date: Pulmonary nodules No date: Stroke [facility]) No date: TIA (transient ischemic attack) No date: Type II diabetes mellitus (HCC)        Home Medications Prior to Admission medications   Medication Sig Start Date End Date Taking? Authorizing Provider   acetaminophen (TYLENOL) 325 MG tablet Take 650 mg by mouth every 6 (six) hours as needed for moderate pain.   Yes [provider]  acidophilus (RISAQUAD) CAPS capsule Take 1 capsule by mouth daily.   Yes [provider]  albuterol (VENTOLIN HFA) 108 (90 Base) MCG/ACT inhaler INHALE 2 PUFFS BY MOUTH EVERY 6 HOURS AS NEEDED FOR COUGHING, WHEEZING, OR SHORTNESS OF BREATH Patient taking differently: Inhale 2 puffs into the lungs See admin instructions. INHALE 2 PUFFS BY MOUTH EVERY 6 HOURS AS NEEDED FOR COUGHING, WHEEZING, OR SHORTNESS OF BREATH 06/19/22  Yes Jacquelin Hawking, PA-C  ALPRAZolam Prudy Feeler) 0.5 MG tablet Take one twice a day and two at bedtime 11/16/22  Yes Myrlene Broker, MD  amLODipine (NORVASC) 10 MG tablet Take 1 tablet by mouth once daily 12/07/22  Yes Jonelle Sidle, MD  dronedarone (MULTAQ) 400 MG tablet Take 1 tablet (400 mg total) by mouth 2 (two) times daily with a meal. 07/26/22 07/27/23 Yes Marinus Maw, MD  esomeprazole (NEXIUM) 40 MG capsule Take 1 capsule (40 mg total) by mouth 2 (two) times daily before a meal. 11/17/21 12/08/22 Yes Carver, Charles K, DO  famotidine (PEPCID) 20 MG tablet Take 20 mg by mouth at bedtime.   Yes [provider]  metoprolol succinate (TOPROL XL) 25 MG 24  hr tablet Take 0.5 tablets (12.5 mg total) by mouth daily. 07/28/22  Yes Jonelle Sidle, MD  nitroGLYCERIN (NITROSTAT) 0.4 MG SL tablet Place 1 tablet (0.4 mg total) under the tongue every 5 (five) minutes x 3 doses as needed for chest pain (if no relief after 3rd dose, proceed to the ED for an evaluation). 07/26/22  Yes Marinus Maw, MD  rivaroxaban (XARELTO) 20 MG TABS tablet TAKE 1 TABLET BY MOUTH ONCE DAILY WITH  SUPPER 09/14/22  Yes Jonelle Sidle, MD  simvastatin (ZOCOR) 20 MG tablet Take 1 tablet by mouth once daily 07/14/22  Yes Marinus Maw, MD  Tiotropium Bromide-Olodaterol (STIOLTO RESPIMAT) 2.5-2.5 MCG/ACT AERS Inhale 2 puffs into the lungs daily.  11/17/22  Yes Nyoka Cowden, MD  vitamin B-12 (CYANOCOBALAMIN) 50 MCG tablet Take 50 mcg by mouth daily.   Yes [provider]  VITAMIN D PO Take 1 tablet by mouth daily.   Yes [provider]  nicotine (NICODERM CQ - DOSED IN MG/24 HOURS) 14 mg/24hr patch Place 14 mg onto the skin daily. 10/26/22   [provider]  nicotine (NICODERM CQ - DOSED IN MG/24 HOURS) 21 mg/24hr patch Place 21 mg onto the skin daily. 10/27/22   [provider]  nicotine (NICODERM CQ - DOSED IN MG/24 HR) 7 mg/24hr patch Place 7 mg onto the skin daily. 10/27/22   [provider]  sucralfate (CARAFATE) 1 GM/10ML suspension Take 10 mLs (1 g total) by mouth 4 (four) times daily -  with meals and at bedtime for 14 days. 12/08/22 12/22/22  Aida Raider, NP      Allergies    Dexilant [dexlansoprazole], Mushroom ext cmplx(shiitake-reishi-mait), Penicillins, and Doxycycline    Review of Systems   Review of Systems  Physical Exam Updated Vital Signs BP 136/85   Pulse (!) 51   Temp 98.6 F (37 C) (Oral)   Resp 15   Ht 5\' 11"  (1.803 m)   Wt 78 kg   SpO2 97%   BMI 23.99 kg/m  Physical Exam  ED Results / Procedures / Treatments   Labs (all labs ordered are listed, but only abnormal results are displayed) Labs Reviewed  BASIC METABOLIC PANEL - Abnormal; Notable for the following components:      Result Value   Potassium 3.4 (*)    Glucose, Bld 168 (*)    All other components within normal limits  PROTIME-INR - Abnormal; Notable for the following components:   Prothrombin Time 16.5 (*)    INR 1.3 (*)    All other components within normal limits  CBC  TROPONIN I (HIGH SENSITIVITY)  TROPONIN I (HIGH SENSITIVITY)    EKG EKG Interpretation  Date/Time:  Friday Dec 08 2022 18:02:12 EDT Ventricular Rate:  80 PR Interval:  170 QRS Duration: 94 QT Interval:  402 QTC Calculation: 463 R Axis:   84 Text Interpretation: Sinus rhythm with Premature atrial complexes  Nonspecific ST abnormality Abnormal ECG When compared with ECG of 23-Oct-2022 21:37,  PACs now present, ST changes in lateral leads more pronounced Confirmed by Vonita Moss [phone]) on 12/08/2022 8:41:24 PM  Radiology DG Chest 2 View  Result Date: 12/08/2022 CLINICAL DATA:  Chest pain EXAM: CHEST - 2 VIEW COMPARISON:  10/24/2022 FINDINGS: Heart and mediastinal contours are within normal limits. No focal opacities or effusions. No acute bony abnormality. IMPRESSION: No active cardiopulmonary disease. Electronically Signed   By: Charlett Nose M.D.   On: 12/08/2022 18:39  Procedures Procedures  {Document cardiac monitor, telemetry assessment procedure when appropriate:1}  Medications Ordered in ED Medications - No data to display  ED Course/ Medical Decision Making/ A&P   {   Click here for ABCD2, HEART and other calculatorsREFRESH Note before signing :1}                          Medical Decision Making Amount and/or Complexity of Data Reviewed Labs: ordered. Radiology: ordered.   ***  {Document critical care time when appropriate:1} {Document review of labs and clinical decision tools ie heart score, Chads2Vasc2 etc:1}  {Document your independent review of radiology images, and any outside records:1} {Document your discussion with family members, caretakers, and with consultants:1} {Document social determinants of health affecting pt's care:1} {Document your decision making why or why not admission, treatments were needed:1} Final Clinical Impression(s) / ED Diagnoses Final diagnoses:  None    Rx / DC Orders ED Discharge Orders     None

## 2022-12-08 NOTE — Discharge Instructions (Signed)
You were seen for your chest pain in the emergency department.   At home, please take tylenol for your pain.    Follow-up with your primary doctor in 2-3 days regarding your visit.  Cardiology will be calling you regarding an appointment within the next 72 hours.  You may contact them if you do not hear from them in that time using the information in this packet.  Return immediately to the emergency department if you experience any of the following: Worsening pain, difficulty breathing, unexplained vomiting or sweating, or any other concerning symptoms.    Thank you for visiting our Emergency Department. It was a pleasure taking care of you today.

## 2022-12-08 NOTE — Telephone Encounter (Signed)
Sent pt his instructions in his MyChart. Phoned the pt's wife and advised her to have the pt to check his MyChart for his instructions as well. Pt's wife expressed understanding

## 2022-12-09 LAB — ALPHA-GAL PANEL
Allergen Lamb IgE: 1.64 kU/L — AB
Beef IgE: 2.13 kU/L — AB
IgE (Immunoglobulin E), Serum: 162 IU/mL (ref 6–495)
O215-IgE Alpha-Gal: 3.31 kU/L — AB
Pork IgE: 1.58 kU/L — AB

## 2022-12-12 ENCOUNTER — Encounter: Payer: Self-pay | Admitting: Pharmacist

## 2022-12-12 NOTE — Telephone Encounter (Signed)
Dena: please let patient know we recommend a manometry.   Schedulers: please arrange due to chronic GERD, chest pain.

## 2022-12-12 NOTE — Telephone Encounter (Signed)
Phoned and advised the pt of recommendation from Lewie Loron, NP. Pt is agreeing to have done

## 2022-12-12 NOTE — Telephone Encounter (Signed)
Referral sent 

## 2022-12-12 NOTE — Telephone Encounter (Signed)
Appeals over turned and multaq now approved. Patient made aware via mychart.

## 2022-12-12 NOTE — Progress Notes (Unsigned)
Cardiology Office Note    Date:  12/14/2022   ID:  Tarrence, Romick [birth year], MRN [id number]  PCP:  Gilmore Laroche, FNP  Cardiologist:  Nona Dell, MD  Electrophysiologist:  Lewayne Bunting, MD   Chief Complaint: chest pain  History of Present Illness:   Richard [name] is a 57 y.o. male with history of nonobstructive CAD, post-cath TIA 2017, mild carotid artery disease, PAF, tobacco abuse, DM, arthritis, asthma, COPD, colitis, remote gastric ulcer, gastritis, GERD, hepatic steatosis, hiatal hernia, HTN, HLD (followed by PCP), overweight, severe anxiety, depression, panic attacks, pulmonary nodules by CT 09/2021 (infectious vs inflammatory per report), gastritis, presbyesophagus, alpha gall allergy who is seen for f/u CP and palpitations.   To recap history, cardiac cath 07/2015 showed 65% distal Cx, 20% mid-distal LAD, 20% prox-distal RCA, EF 60%, EDP . This was complicated by post-cath TIA. Echo had shown normal EF. Clinical case has been complicated by chronic somatic symptomatology. Last stress test 12/2020 ws felt to be low risk (soft tissue attenuation rather than scar), EF 64%. He had recurrent atypical TIA sx in 09/2020 (bubble study was negative) as well as 07/2021 with stroke ruled out. Last echo 07/2021 EF 60-65%, dilated IVC otherwise unrevealing. From arrhythmia standpoint, he remotely failed flecainide and metoprolol. Ablation not pursued due to financial reasons. He was eventually switched to [facility] and has followed with Dr. Ladona Ridgel for this, last OV 10/2021. He's been only on low dose BB due to baseline sinus bradycardia. Sleep study has been recommended several times over the years but not performed. I saw him last in 10/2022 following ER visit 10/24/22 with atypical CP and negative troponins. He'd eaten a tuna sub from [place] that tasted a bit off and began to develop a sense of slower pounding HR with pulse ox showing HR in the 30s. He began to get very anxious about this and  subsequently HR went to 150s. This was associated with some chest pressure that lasted several hours. ED workup unrevealing. EKG showed NSR. He later felt symptoms were due to a panic attack. It was recommended he pursue Itamar which he has but has not yet completed. I also recommended he review f/u pulm nodule screening with PCP, has chest CT coming up. He saw Dr. Ladona Ridgel to f/u his HR episode who offered to consider ablation or dofetilide but the patient wished to continue with Multaq. He had recent endoscopy 12/04/22 with gastritis and presbyesophagus. He was also recently found to have alpha gal allergy and is being referred to an allergist. He has an rx for epipen and is avoiding mammalian meat.  He is seen to follow-up an ER visit on 12/08/22 when he had an episode of chest pain and heart palpitations. He was out and about at the pharmacy and leaned over then stood up and began to feel sensation of palpitations, anxiety, and chest pain. The chest pain had various qualities such as a pencil pushing on him, a vague pressure, and pain over left side. Symptoms persisted for several hours. He was seen in ED where troponins were negative. EKG showed NSR with PACs with subtle accentuation of previous STTW changes. Symptoms resolved without specific intervention. He does not feel that intensity of pain today. EKG today shows NSR. Interestingly he feels these symptoms seem to flare up whenever he tries to go longer without a cigarette. He sometimes takes a Xanax as he feels there is a component of anxiety - it helps the anxiousness but not  always the cardiac symptoms.   Labwork independently reviewed: 12/08/22 troponins negative, CBC Wnl, K 3.4, Cr 1.0, CXR NAD 10/2022 Mg 2.3, AST/ALT OK, LDL 58, trig 67, TSH WNL  Past History   Past Medical History:  Diagnosis Date   Arthritis    Asthma    CAD (coronary artery disease)    a. Cardiac cath 07/2015 showed 65% distal Cx, 20% mid-distal LAD, 20% prox-distal RCA,  EF 60%, EDP .   Colitis 1990   COPD (chronic obstructive pulmonary disease) (HCC)    Depression    Dysrhythmia    Essential hypertension    Fatty liver    Gastric ulcer 2003; 2012   2003: + esophagitis; negative H.pylori serology  2012: Dr. Darrick Penna, mild gastritis, Bravo PH probe placement, negative H.pylori   GERD (gastroesophageal reflux disease)    Hepatic steatosis    History of hiatal hernia    Hyperlipemia    Overweight    Panic attacks    Paroxysmal atrial fibrillation (HCC)    Pulmonary nodules    Stroke (HCC)    TIA (transient ischemic attack)    Type II diabetes mellitus (HCC)     Past Surgical History:  Procedure Laterality Date   BALLOON DILATION N/A 07/25/2021   Procedure: BALLOON DILATION;  Surgeon: Lanelle Bal, DO;  [facility]: [facility];  Service: Endoscopy;  Laterality: N/A;   BIOPSY  07/25/2021   Procedure: BIOPSY;  Surgeon: Lanelle Bal, DO;  [facility]: [facility];  Service: Endoscopy;;   BIOPSY  12/04/2022   Procedure: BIOPSY;  Surgeon: Lanelle Bal, DO;  [facility]: [facility];  Service: Endoscopy;;   BRAVO PH STUDY  05/03/2011   NWG:NFAO gastritis/normal esophagus and duodenum   CARDIAC CATHETERIZATION  1990s X 1; 2005; 08/12/2015   CARDIAC CATHETERIZATION N/A 08/12/2015   Procedure: Left Heart Cath and Coronary Angiography;  Surgeon: Lyn Records, MD; LAD 20%, CFX 65%, RCA 20%, EF 60%    COLONOSCOPY  1990   COLONOSCOPY WITH PROPOFOL N/A 11/21/2016   Dr. Darrick Penna: non-thrombosed external hemorrhoids, one 6 mm polyp (polypoid lesion), internal hemorrhoids. TI Normal. 10 years screening   ESOPHAGOGASTRODUODENOSCOPY  05/03/2011   ZHY:QMVH gastritis   ESOPHAGOGASTRODUODENOSCOPY (EGD) WITH PROPOFOL N/A 07/25/2021   Procedure: ESOPHAGOGASTRODUODENOSCOPY (EGD) WITH PROPOFOL;  Surgeon: Lanelle Bal, DO;  [facility];  Service: Endoscopy;  Laterality: N/A;  1:30pm   ESOPHAGOGASTRODUODENOSCOPY (EGD) WITH PROPOFOL N/A 12/04/2022    Procedure: ESOPHAGOGASTRODUODENOSCOPY (EGD) WITH PROPOFOL;  Surgeon: Lanelle Bal, DO;  [facility]: [facility];  Service: Endoscopy;  Laterality: N/A;  8:00AM;ASA 3   NECK MASS EXCISION Right    "done in dr's office; behind right ear/side of ncek"   POLYPECTOMY  11/21/2016   Procedure: POLYPECTOMY;  Surgeon: West Bali, MD;  Location: [facility];  Service: Endoscopy;;  descending colon polyp   SHOULDER ARTHROSCOPY W/ ROTATOR CUFF REPAIR Right 2006   acromioclavicular joint arthrosis    Current Medications: Current Meds  Medication Sig   acetaminophen (TYLENOL) 325 MG tablet Take 650 mg by mouth every 6 (six) hours as needed for moderate pain.   acidophilus (RISAQUAD) CAPS capsule Take 1 capsule by mouth daily.   albuterol (VENTOLIN HFA) 108 (90 Base) MCG/ACT inhaler INHALE 2 PUFFS BY MOUTH EVERY 6 HOURS AS NEEDED FOR COUGHING, WHEEZING, OR SHORTNESS OF BREATH   ALPRAZolam (XANAX) 0.5 MG tablet Take one twice a day and two at bedtime   amLODipine (NORVASC) 10 MG  tablet Take 1 tablet by mouth once daily   dronedarone (MULTAQ) 400 MG tablet Take 1 tablet (400 mg total) by mouth 2 (two) times daily with a meal.   esomeprazole (NEXIUM) 20 MG capsule Take 20 mg by mouth 2 (two) times daily. 2 caps am, 2 caps pm   famotidine (PEPCID) 20 MG tablet Take 20 mg by mouth as needed for heartburn or indigestion.   metoprolol succinate (TOPROL XL) 25 MG 24 hr tablet Take 0.5 tablets (12.5 mg total) by mouth daily.   nitroGLYCERIN (NITROSTAT) 0.4 MG SL tablet Place 1 tablet (0.4 mg total) under the tongue every 5 (five) minutes x 3 doses as needed for chest pain (if no relief after 3rd dose, proceed to the ED for an evaluation).   rivaroxaban (XARELTO) 20 MG TABS tablet TAKE 1 TABLET BY MOUTH ONCE DAILY WITH  SUPPER   simvastatin (ZOCOR) 20 MG tablet Take 1 tablet by mouth once daily   sucralfate (CARAFATE) 1 GM/10ML suspension Take 10 mLs (1 g total) by mouth 4 (four) times daily -  with meals  and at bedtime for 14 days.   Tiotropium Bromide-Olodaterol (STIOLTO RESPIMAT) 2.5-2.5 MCG/ACT AERS Inhale 2 puffs into the lungs daily.   vitamin B-12 (CYANOCOBALAMIN) 50 MCG tablet Take 50 mcg by mouth daily.   VITAMIN D PO Take 1 tablet by mouth daily.   [DISCONTINUED] esomeprazole (NEXIUM) 40 MG capsule Take 1 capsule (40 mg total) by mouth 2 (two) times daily before a meal.     Allergies:   Dexilant [dexlansoprazole], Mushroom ext cmplx(shiitake-reishi-mait), Penicillins, and Doxycycline   Social History   Socioeconomic History   Marital status: Married    Spouse name: Not on file   Number of children: Not on file   Years of education: Not on file   Highest education level: Not on file  Occupational History   Occupation: full time    Employer: UNEMPLOYED  Tobacco Use   Smoking status: Every Day    Packs/day: 0.50    Years: 25.00    Additional pack years: 0.00    Total pack years: 12.50    Types: Cigarettes    Start date: 07/17/1982   Smokeless tobacco: Never   Tobacco comments:    1/2 pack a day  Vaping Use   Vaping Use: Never used  Substance and Sexual Activity   Alcohol use: No    Alcohol/week: 0.0 standard drinks of alcohol   Drug use: No   Sexual activity: Yes    Birth control/protection: None  Other Topics Concern   Not on file  Social History Narrative   Pt lives in [place] with wife.  5 children.  Unemployed due to panic attacks and back pain   Social Determinants of Health   Financial Resource Strain: Not on file  Food Insecurity: Not on file  Transportation Needs: Not on file  Physical Activity: Not on file  Stress: Not on file  Social Connections: Not on file     Family History:  The patient's family history includes ADD / ADHD in his daughter; Alcohol abuse in his father and mother; Anxiety disorder in his sister and sister; Dementia in his cousin and paternal uncle; Depression in his sister; Diabetes in his brother and father; Heart attack  (age of onset: 29) in his brother; Heart attack (age of onset: 61) in his father; Hypertension in his brother, brother, and brother; Lung cancer in his mother; Seizures in his brother. There is no history  of Colon cancer, Drug abuse, Bipolar disorder, OCD, Paranoid behavior, Schizophrenia, Sexual abuse, or Physical abuse.  ROS:   Please see the history of present illness.  All other systems are reviewed and otherwise negative.    EKG(s)/Additional Testing   EKG:  EKG is ordered today, personally reviewed, demonstrating SB 53bpm, nonspecific STTW changes similar to prior. The EKG from recent ER visit showed more pronounced accentuation of these.  CV Studies: Cardiac studies reviewed are outlined and summarized above. Otherwise please see EMR for full report.  Recent Labs: 10/20/2022: TSH 2.450 10/23/2022: ALT 30 11/06/2022: Magnesium 2.3 12/08/2022: BUN 19; Creatinine, Ser 1.00; Hemoglobin 14.6; Platelets 173; Potassium 3.4; Sodium 136  Recent Lipid Panel    Component Value Date/Time   CHOL 115 10/20/2022 0850   TRIG 67 10/20/2022 0850   HDL 43 10/20/2022 0850   CHOLHDL 2.7 10/20/2022 0850   CHOLHDL 2.9 06/13/2022 0945   VLDL 8 06/13/2022 0945   LDLCALC 58 10/20/2022 0850    PHYSICAL EXAM:    VS:  BP 118/60   Pulse (!) 53   Ht 5\' 11"  (1.803 m)   Wt 169 lb (76.7 kg)   SpO2 97%   BMI 23.57 kg/m   BMI: Body mass index is 23.57 kg/m.  GEN: Well nourished, well developed male in no acute distress HEENT: normocephalic, atraumatic Neck: no JVD, carotid bruits, or masses Cardiac: RRR; no murmurs, rubs, or gallops, no edema  Respiratory:  clear to auscultation bilaterally, normal work of breathing GI: soft, nontender, nondistended, + BS MS: no deformity or atrophy Skin: warm and dry, no rash Neuro:  Alert and Oriented x 3, Strength and sensation are intact, follows commands. No SI/HI Psych: euthymic mood, full affect  Wt Readings from Last 3 Encounters:  12/14/22 169 lb (76.7 kg)   12/08/22 172 lb (78 kg)  12/05/22 172 lb (78 kg)     ASSESSMENT & PLAN:   1. Chest pain, nonobstructive CAD, HLD, chest pain - mixed features of chest pain, palpitations. EKG was slightly more abnormal during recent ED visit. Difficult to know where gastritis, anxiety, alpha gal play into this. Per shared decision making will pursue updated Leixscan nuclear stress test. Lipids are followed by PCP, maintained on simvastatin, recent LDL at goal. He is not on ASA due to concomitant Xarelto. Patient does not plan to quit smoking at this time. Did not tolerate nicotine patches.  Shared Decision Making/Informed Consent The risks [chest pain, shortness of breath, cardiac arrhythmias, dizziness, blood pressure fluctuations, myocardial infarction, stroke/transient ischemic attack, nausea, vomiting, allergic reaction, radiation exposure, metallic taste sensation and life-threatening complications (estimated to be 1 in 10,000)], benefits (risk stratification, diagnosing coronary artery disease, treatment guidance) and alternatives of a nuclear stress test were discussed in detail with Richard [name] and he agrees to proceed.  2. PAF, recent palpitations - will pursue 2 week Zio patch. Maintaining NSR today. Recent PACs in ED noted. Recheck BMET given hypokalemia. Continue metoprolol, Multaq, Xarelto.   3. Hypokalemia - recheck labs today.  4. Carotid artery disease - mild by 2022 duplex. CTA head/neck 07/2021 without significant stenosis. Continue statin. No bruits on exam. Continue to follow clinically.  5. Snoring - has Itamar sleep study pending. Reports thunderstorms have interfered with cell phone recently, but agrees to complete.     Disposition: F/u with APP in [place] in 6 weeks post-testing.   Medication Adjustments/Labs and Tests Ordered: Current medicines are reviewed at length with the patient today.  Concerns regarding medicines  are outlined above. Medication changes, Labs and Tests  ordered today are summarized above and listed in the Patient Instructions accessible in Encounters.   Signed, Laurann Montana, PA-C  12/14/2022 8:51 AM    [facility] Phone: [phone]; Fax: [phone]

## 2022-12-12 NOTE — Addendum Note (Signed)
Addended by: Armstead Peaks on: 12/12/2022 12:54 PM   Modules accepted: Orders

## 2022-12-13 ENCOUNTER — Ambulatory Visit: Payer: Medicaid Other

## 2022-12-13 ENCOUNTER — Telehealth: Payer: Self-pay | Admitting: Family Medicine

## 2022-12-13 DIAGNOSIS — Z91018 Allergy to other foods: Secondary | ICD-10-CM

## 2022-12-13 LAB — HM DIABETES EYE EXAM

## 2022-12-13 NOTE — Telephone Encounter (Signed)
Spoke to patient and recommending referral to allergy and immunology to discuss alpha-gal syndrome.

## 2022-12-13 NOTE — Telephone Encounter (Signed)
Patient called about test results ?

## 2022-12-13 NOTE — Telephone Encounter (Signed)
Calling for the results of his alpha gal panel

## 2022-12-14 ENCOUNTER — Encounter: Payer: Self-pay | Admitting: Physician Assistant

## 2022-12-14 ENCOUNTER — Telehealth: Payer: Self-pay | Admitting: Family Medicine

## 2022-12-14 ENCOUNTER — Ambulatory Visit: Payer: Medicaid Other | Attending: Physician Assistant | Admitting: Physician Assistant

## 2022-12-14 ENCOUNTER — Ambulatory Visit (INDEPENDENT_AMBULATORY_CARE_PROVIDER_SITE_OTHER): Payer: Medicaid Other

## 2022-12-14 VITALS — BP 118/60 | HR 53 | Ht 71.0 in | Wt 169.0 lb

## 2022-12-14 DIAGNOSIS — I2510 Atherosclerotic heart disease of native coronary artery without angina pectoris: Secondary | ICD-10-CM | POA: Diagnosis not present

## 2022-12-14 DIAGNOSIS — R002 Palpitations: Secondary | ICD-10-CM

## 2022-12-14 DIAGNOSIS — E785 Hyperlipidemia, unspecified: Secondary | ICD-10-CM

## 2022-12-14 DIAGNOSIS — R0683 Snoring: Secondary | ICD-10-CM

## 2022-12-14 DIAGNOSIS — E876 Hypokalemia: Secondary | ICD-10-CM | POA: Diagnosis not present

## 2022-12-14 DIAGNOSIS — I480 Paroxysmal atrial fibrillation: Secondary | ICD-10-CM

## 2022-12-14 DIAGNOSIS — I6529 Occlusion and stenosis of unspecified carotid artery: Secondary | ICD-10-CM

## 2022-12-14 DIAGNOSIS — R072 Precordial pain: Secondary | ICD-10-CM

## 2022-12-14 DIAGNOSIS — R0789 Other chest pain: Secondary | ICD-10-CM

## 2022-12-14 LAB — BASIC METABOLIC PANEL
BUN/Creatinine Ratio: 15 (ref 9–20)
BUN: 14 mg/dL (ref 6–24)
CO2: 24 mmol/L (ref 20–29)
Calcium: 9.3 mg/dL (ref 8.7–10.2)
Chloride: 104 mmol/L (ref 96–106)
Creatinine, Ser: 0.91 mg/dL (ref 0.76–1.27)
Glucose: 98 mg/dL (ref 70–99)
Potassium: 3.7 mmol/L (ref 3.5–5.2)
Sodium: 143 mmol/L (ref 134–144)
eGFR: 98 mL/min/{1.73_m2} (ref >59)

## 2022-12-14 NOTE — Progress Notes (Unsigned)
Enrolled for [employer] to mail a ZIO XT long term holter monitor to the patients address on file.  °Dr. Taylor to read. °

## 2022-12-14 NOTE — Patient Instructions (Addendum)
Medication Instructions:  Your physician recommends that you continue on your current medications as directed. Please refer to the Current Medication list given to you today.  *If you need a refill on your cardiac medications before your next appointment, please call your pharmacy*   Lab Work: TODAY: BMET If you have labs (blood work) drawn today and your tests are completely normal, you will receive your results only by: MyChart Message (if you have MyChart) OR A paper copy in the mail If you have any lab test that is abnormal or we need to change your treatment, we will call you to review the results.  Testing/Procedures: Your physician has requested that you have a lexiscan myoview. For further information please visit [email or web address] Please follow instruction sheet, as given.   Your physician has requested that you wear a Zio heart monitor for 14 days. This will be mailed to your home with instructions on how to apply the monitor and how to return it when finished. Please allow 2 weeks after returning the heart monitor before our office calls you with the results.   Follow-Up: At [facility], you and your health needs are our priority.  As part of our continuing mission to provide you with exceptional heart care, we have created designated Provider Care Teams.  These Care Teams include your primary Cardiologist (physician) and Advanced Practice Providers (APPs -  Physician Assistants and Nurse Practitioners) who all work together to provide you with the care you need, when you need it.  Your next appointment:   6 week(s) in [place]  The format for your next appointment:   In Person  Provider:   APP at [facility] {  Other Instructions Lexiscan Myoview (Stress Test) Instructions Please arrive 15 minutes prior to your appointment time for registration and insurance purposes.  The test will take approximately 3 to 4 hours to complete; you may bring reading  material.  If someone comes with you to your appointment, they will need to remain in the main lobby due to limited space in the testing area. **If you are pregnant or breastfeeding, please notify the nuclear lab prior to your appointment**  How to prepare for your Myocardial Perfusion Test: Do not eat or drink 3 hours prior to your test, except you may have water. Do not consume products containing caffeine (regular or decaffeinated) 12 hours prior to your test. (ex: coffee, chocolate, sodas, tea). Do bring a list of your current medications with you.  If not listed below, you may take your medications as normal. Do wear comfortable clothes (no dresses or overalls) and walking shoes, tennis shoes preferred (No heels or open toe shoes are allowed). Do NOT wear cologne, perfume, aftershave, or lotions (deodorant is allowed). If these instructions are not followed, your test will have to be rescheduled.  Please report to [address], [address] for your test.  If you have questions or concerns about your appointment, you can call the Nuclear Lab at [phone].  If you cannot keep your appointment, please provide 24 hours notification to the Nuclear Lab, to avoid a possible $50 charge to your account.    ZIO XT- Long Term Monitor Instructions     Your physician has requested you wear a ZIO patch monitor for 14 days.  This is a single patch monitor. [employer] supplies one patch monitor per enrollment. Additional  stickers are not available. Please do not apply patch if you will be having a Nuclear Stress Test,  Echocardiogram, Cardiac CT, MRI, or Chest Xray during the period you would be wearing the  monitor. The patch cannot be worn during these tests. You cannot remove and re-apply the  ZIO XT patch monitor.  Your ZIO patch monitor will be mailed 3 day USPS to your address on file. It may take 3-5 days  to receive your monitor after you have been enrolled.  Once you have received your  monitor, please review the enclosed instructions. Your monitor  has already been registered assigning a specific monitor serial # to you.     Billing and Patient Assistance Program Information     We have supplied [employer] with any of your insurance information on file for billing purposes.  [employer] offers a sliding scale Patient Assistance Program for patients that do not have  insurance, or whose insurance does not completely cover the cost of the ZIO monitor.  You must apply for the Patient Assistance Program to qualify for this discounted rate.  To apply, please call [employer] at [phone], select option 4, select option 2, ask to apply for  Patient Assistance Program. Meredeth Ide will ask your household income, and how many people  are in your household. They will quote your out-of-pocket cost based on that information.  [employer] will also be able to set up a 79-month, interest-free payment plan if needed.     Applying the monitor     Shave hair from upper left chest.  Hold abrader disc by orange tab. Rub abrader in 40 strokes over the upper left chest as  indicated in your monitor instructions.  Clean area with 4 enclosed alcohol pads. Let dry.  Apply patch as indicated in monitor instructions. Patch will be placed under collarbone on left  side of chest with arrow pointing upward.  Rub patch adhesive wings for 2 minutes. Remove white label marked "1". Remove the white  label marked "2". Rub patch adhesive wings for 2 additional minutes.  While looking in a mirror, press and release button in center of patch. A small green light will  flash 3-4 times. This will be your only indicator that the monitor has been turned on.  Do not shower for the first 24 hours. You may shower after the first 24 hours.  Press the button if you feel a symptom. You will hear a small click. Record Date, Time and  Symptom in the Patient Logbook.  When you are ready to remove the patch, follow instructions on  the last 2 pages of Patient  Logbook. Stick patch monitor onto the last page of Patient Logbook.  Place Patient Logbook in the blue and white box. Use locking tab on box and tape box closed  securely. The blue and white box has prepaid postage on it. Please place it in the mailbox as  soon as possible. Your physician should have your test results approximately 7 days after the  monitor has been mailed back to [facility].  Call [facility] Customer Care at [phone] if you have questions regarding  your ZIO XT patch monitor. Call them immediately if you see an orange light blinking on your  monitor.  If your monitor falls off in less than 4 days, contact our Monitor department at [phone].  If your monitor becomes loose or falls off after 4 days call [employer] at [phone] for  suggestions on securing your monitor.

## 2022-12-14 NOTE — Telephone Encounter (Signed)
Patient wife called said patient has not yet received epi pen.  York Spaniel was referred out to allergy provider not seen yet.   Pharmacy: [facility]

## 2022-12-14 NOTE — Telephone Encounter (Signed)
Spoke to pt wife states Dr. Barbaraann Faster mentioned an Epi Pen would be sent in, until he can get in with allergist, please advice?

## 2022-12-15 ENCOUNTER — Other Ambulatory Visit: Payer: Self-pay | Admitting: Family Medicine

## 2022-12-15 ENCOUNTER — Telehealth: Payer: Self-pay | Admitting: Physician Assistant

## 2022-12-15 DIAGNOSIS — I10 Essential (primary) hypertension: Secondary | ICD-10-CM

## 2022-12-15 DIAGNOSIS — Z91018 Allergy to other foods: Secondary | ICD-10-CM

## 2022-12-15 DIAGNOSIS — Z79899 Other long term (current) drug therapy: Secondary | ICD-10-CM

## 2022-12-15 MED ORDER — POTASSIUM CHLORIDE CRYS ER 20 MEQ PO TBCR: 20.0000 meq | EXTENDED_RELEASE_TABLET | Freq: Every day | ORAL | 6 refills | Status: DC

## 2022-12-15 MED ORDER — EPINEPHRINE 0.3 MG/0.3ML IJ SOAJ
0.3000 mg | INTRAMUSCULAR | 2 refills | Status: DC | PRN
Start: 2022-12-15 — End: 2022-12-18

## 2022-12-15 NOTE — Telephone Encounter (Signed)
Patient spouse calling back needs to know the status of the epi pen.

## 2022-12-15 NOTE — Telephone Encounter (Signed)
Spoke with patient and discussed lab results.  Per Ronie Spies, PA-C: Please let patient know that labs are stable. Potassium is better than previous low value but given history of afib/palpitations, would aim for goal of 4.0 or greater - was 3.7. Please rx KCl once daily with recheck BMET 1 week. OK to do in [place] as that's where pt lives.   [facility].  BMET ordered, patient will have drawn at [facility] in [place]. Lab order for BMET released to [facility].

## 2022-12-15 NOTE — Telephone Encounter (Signed)
Prescription sent to [facility] pharmacy in [place]

## 2022-12-16 DIAGNOSIS — Z419 Encounter for procedure for purposes other than remedying health state, unspecified: Secondary | ICD-10-CM | POA: Diagnosis not present

## 2022-12-18 ENCOUNTER — Encounter: Payer: Self-pay | Admitting: Internal Medicine

## 2022-12-18 ENCOUNTER — Other Ambulatory Visit: Payer: Self-pay

## 2022-12-18 ENCOUNTER — Ambulatory Visit: Payer: Medicaid Other | Admitting: Internal Medicine

## 2022-12-18 VITALS — BP 108/60 | HR 65 | Temp 98.3°F | Resp 16 | Ht 70.0 in | Wt 167.8 lb

## 2022-12-18 DIAGNOSIS — Z743 Need for continuous supervision: Secondary | ICD-10-CM | POA: Diagnosis not present

## 2022-12-18 DIAGNOSIS — J3089 Other allergic rhinitis: Secondary | ICD-10-CM | POA: Diagnosis not present

## 2022-12-18 DIAGNOSIS — Z79899 Other long term (current) drug therapy: Secondary | ICD-10-CM | POA: Diagnosis not present

## 2022-12-18 DIAGNOSIS — R110 Nausea: Secondary | ICD-10-CM | POA: Diagnosis not present

## 2022-12-18 DIAGNOSIS — J432 Centrilobular emphysema: Secondary | ICD-10-CM | POA: Diagnosis not present

## 2022-12-18 DIAGNOSIS — R4589 Other symptoms and signs involving emotional state: Secondary | ICD-10-CM | POA: Diagnosis not present

## 2022-12-18 DIAGNOSIS — Z91018 Allergy to other foods: Secondary | ICD-10-CM | POA: Diagnosis not present

## 2022-12-18 DIAGNOSIS — J343 Hypertrophy of nasal turbinates: Secondary | ICD-10-CM

## 2022-12-18 DIAGNOSIS — F17218 Nicotine dependence, cigarettes, with other nicotine-induced disorders: Secondary | ICD-10-CM | POA: Diagnosis not present

## 2022-12-18 DIAGNOSIS — R079 Chest pain, unspecified: Secondary | ICD-10-CM | POA: Diagnosis not present

## 2022-12-18 MED ORDER — FLUTICASONE PROPIONATE 50 MCG/ACT NA SUSP: 2.0000 | Freq: Every day | NASAL | 5 refills | Status: DC

## 2022-12-18 MED ORDER — EPINEPHRINE 0.3 MG/0.3ML IJ SOAJ: 0.3000 mg | INTRAMUSCULAR | 1 refills | Status: DC | PRN

## 2022-12-18 NOTE — Patient Instructions (Addendum)
Chronic Rhinitis:  - Positive skin test 12/2022: none - Use nasal saline rinses before nose sprays such as with Neilmed Sinus Rinse.  Use distilled water.   - Use Flonase 2 sprays each nostril daily. Aim upward and outward.  Alpha gal Syndrome - please strictly avoid all mammalian meat.  - okay to eat seafood, chicken, [place].  - for SKIN only reaction, okay to take Benadryl 25mg  capsules every 6 hours - for SKIN + ANY additional symptoms, OR IF concern for LIFE THREATENING reaction = Epipen Autoinjector EpiPen 0.3 mg. - If using Epinephrine autoinjector, call 911 or go to the ER.   COPD - Try to cut back on cigarette use.  - Follow up with Pulm.

## 2022-12-18 NOTE — Progress Notes (Signed)
NEW PATIENT  Date of Service/Encounter:  12/18/22  Consult requested by: Gilmore Laroche, FNP   Subjective:   Richard [name] (DOB: Apr 13, 1966) is a 57 y.o. male who presents to the clinic on 12/18/2022 with a chief complaint of Allergic Reaction (Says he had swelling on his chest and issues breathing. Blood test confirmed alpha gal. Says he went to his PCP for a tic bite and was notified then about the alpha gal allergy. Orinda Kenner has issues with house hold chemicals. ), Allergic Rhinitis  (Says he has seasonal allergies. ), and Sinus Problem (Says he has been suffering with sinus headaches.) .    History obtained from: chart review and patient.   COPD: Followed by Pulm. Smoked since age 2, smoking about 2-7cigs/day Has trouble with chronic cough and SOB   He has tried quitting a couple of times but not interested in smoking cessation at this time because it worsens his anxiety. On Stiolto samples, using it PRN because it seems to worsen his symptoms. Also has PRN albuterol that he uses frequently.  Previous CT chest 09/2021 shows centrilobular emphysema.   Rhinitis:  Started about 2-3 years ago.  Symptoms include: nasal congestion, rhinorrhea, and post nasal drainageFeels soreness in sinuses, Headaches   Occurs year-round Potential triggers: pollens such as flowers, grasses Treatments tried:  No oral anti histamines No nose sprays  Previous allergy testing: no History of reflux/heartburn: yes; followed by GI and is taking nexium  History of sinus surgery: no Nonallergic triggers: strong scents     Alpha Gal Syndrome Reports having a tick bite about 2 years ago and has had issues with GI symptoms where he has abdominal pain, reflux, constipation and dysphagia.  Sometimes also has intermittent palpitations and feels like there is knot in his chest pushing against his heart.  EGD in 11/2022 showed abnormal esophageal motility, Barrett's esophagus and gastritis.  Also checked an  alpha gal level that was elevated and just recently about 1 week ago was told to stop eating mammalian meat. Also prescribed Epipen.  Of note, followed by Cardiology for CAD and paroxysmal AF; also noted to have anxiety/panic attacks.   Past Medical History: Past Medical History:  Diagnosis Date   Arthritis    Asthma    CAD (coronary artery disease)    a. Cardiac cath 07/2015 showed 65% distal Cx, 20% mid-distal LAD, 20% prox-distal RCA, EF 60%, EDP .   Colitis 1990   COPD (chronic obstructive pulmonary disease) (HCC)    Depression    Dysrhythmia    Essential hypertension    Fatty liver    Gastric ulcer 2003; 2012   2003: + esophagitis; negative H.pylori serology  2012: Dr. Darrick Penna, mild gastritis, Bravo PH probe placement, negative H.pylori   GERD (gastroesophageal reflux disease)    Hepatic steatosis    History of hiatal hernia    Hyperlipemia    Overweight    Panic attacks    Paroxysmal atrial fibrillation (HCC)    Pulmonary nodules    Stroke (HCC)    TIA (transient ischemic attack)    Type II diabetes mellitus (HCC)    Past Surgical History: Past Surgical History:  Procedure Laterality Date   BALLOON DILATION N/A 07/25/2021   Procedure: BALLOON DILATION;  Surgeon: Lanelle Bal, DO;  [facility];  Service: Endoscopy;  Laterality: N/A;   BIOPSY  07/25/2021   Procedure: BIOPSY;  Surgeon: Lanelle Bal, DO;  [facility];  Service: Endoscopy;;  BIOPSY  12/04/2022   Procedure: BIOPSY;  Surgeon: Lanelle Bal, DO;  [facility];  Service: Endoscopy;;   BRAVO PH STUDY  05/03/2011   WUJ:WJXB gastritis/normal esophagus and duodenum   CARDIAC CATHETERIZATION  1990s X 1; 2005; 08/12/2015   CARDIAC CATHETERIZATION N/A 08/12/2015   Procedure: Left Heart Cath and Coronary Angiography;  Surgeon: Lyn Records, MD; LAD 20%, CFX 65%, RCA 20%, EF 60%    COLONOSCOPY  1990   COLONOSCOPY WITH PROPOFOL N/A 11/21/2016   Dr. Darrick Penna: non-thrombosed  external hemorrhoids, one 6 mm polyp (polypoid lesion), internal hemorrhoids. TI Normal. 10 years screening   ESOPHAGOGASTRODUODENOSCOPY  05/03/2011   JYN:WGNF gastritis   ESOPHAGOGASTRODUODENOSCOPY (EGD) WITH PROPOFOL N/A 07/25/2021   Procedure: ESOPHAGOGASTRODUODENOSCOPY (EGD) WITH PROPOFOL;  Surgeon: Lanelle Bal, DO;  [facility];  Service: Endoscopy;  Laterality: N/A;  1:30pm   ESOPHAGOGASTRODUODENOSCOPY (EGD) WITH PROPOFOL N/A 12/04/2022   Procedure: ESOPHAGOGASTRODUODENOSCOPY (EGD) WITH PROPOFOL;  Surgeon: Lanelle Bal, DO;  [facility];  Service: Endoscopy;  Laterality: N/A;  8:00AM;ASA 3   NECK MASS EXCISION Right    "done in dr's office; behind right ear/side of ncek"   POLYPECTOMY  11/21/2016   Procedure: POLYPECTOMY;  Surgeon: West Bali, MD;  [facility];  Service: Endoscopy;;  descending colon polyp   SHOULDER ARTHROSCOPY W/ ROTATOR CUFF REPAIR Right 2006   acromioclavicular joint arthrosis    Family History: Family History  Problem Relation Age of Onset   Lung cancer Mother    Alcohol abuse Mother    Heart attack Father 29   Diabetes Father    Alcohol abuse Father    Asthma Sister    Anxiety disorder Sister    Depression Sister    Anxiety disorder Sister    Hypertension Brother    Hypertension Brother    Heart attack Brother 54   Diabetes Brother    Hypertension Brother    Seizures Brother    Dementia Paternal Uncle    ADD / ADHD Daughter    Dementia Cousin    Colon cancer Neg Hx    Drug abuse Neg Hx    Bipolar disorder Neg Hx    OCD Neg Hx    Paranoid behavior Neg Hx    Schizophrenia Neg Hx    Sexual abuse Neg Hx    Physical abuse Neg Hx     Social History:  Lives in a 36 year  mobile house Flooring in bedroom: wood Pets: none Tobacco use/exposure: started at age 43, 42-7 cigs/day Job: unemployed  Medication List:  Allergies as of 12/18/2022       Reactions   Alpha-gal Anaphylaxis   Dexilant  [dexlansoprazole] Anaphylaxis   Mushroom Ext Cmplx(shiitake-reishi-mait) Anaphylaxis   Rapid heart rate.   Penicillins Anaphylaxis   Has patient had a PCN reaction causing immediate rash, facial/tongue/throat swelling, SOB or lightheadedness with hypotension: Yes Has patient had a PCN reaction causing severe rash involving mucus membranes or skin necrosis: No Has patient had a PCN reaction that required hospitalization Yes Has patient had a PCN reaction occurring within the last 10 years: No If all of the above answers are "NO", then may proceed with Cephalosporin use.   Doxycycline Nausea And Vomiting           Medication List        Accurate as of December 18, 2022 10:47 AM. If you have any questions, ask your nurse or doctor.  acidophilus Caps capsule Take 1 capsule by mouth daily.   albuterol 108 (90 Base) MCG/ACT inhaler Commonly known as: VENTOLIN HFA INHALE 2 PUFFS BY MOUTH EVERY 6 HOURS AS NEEDED FOR COUGHING, WHEEZING, OR SHORTNESS OF BREATH   ALPRAZolam 0.5 MG tablet Commonly known as: Xanax Take one twice a day and two at bedtime   amLODipine 10 MG tablet Commonly known as: NORVASC Take 1 tablet by mouth once daily   EPINEPHrine 0.3 mg/0.3 mL Soaj injection Commonly known as: EPI-PEN Inject 0.3 mg into the muscle as needed for anaphylaxis.   esomeprazole 20 MG capsule Commonly known as: NEXIUM Take 20 mg by mouth 2 (two) times daily. 2 caps am, 2 caps pm   famotidine 20 MG tablet Commonly known as: PEPCID Take 20 mg by mouth as needed for heartburn or indigestion.   fluticasone 50 MCG/ACT nasal spray Commonly known as: FLONASE Place 2 sprays into both nostrils daily. Started by: Birder Robson, MD   metoprolol succinate 25 MG 24 hr tablet Commonly known as: Toprol XL Take 0.5 tablets (12.5 mg total) by mouth daily.   Multaq 400 MG tablet Generic drug: dronedarone Take 1 tablet (400 mg total) by mouth 2 (two) times daily with a meal.    nitroGLYCERIN 0.4 MG SL tablet Commonly known as: NITROSTAT Place 1 tablet (0.4 mg total) under the tongue every 5 (five) minutes x 3 doses as needed for chest pain (if no relief after 3rd dose, proceed to the ED for an evaluation).   potassium chloride SA 20 MEQ tablet Commonly known as: KLOR-CON M Take 1 tablet (20 mEq total) by mouth daily.   simvastatin 20 MG tablet Commonly known as: ZOCOR Take 1 tablet by mouth once daily   Stiolto Respimat 2.5-2.5 MCG/ACT Aers Generic drug: Tiotropium Bromide-Olodaterol Inhale 2 puffs into the lungs daily.   sucralfate 1 GM/10ML suspension Commonly known as: Carafate Take 10 mLs (1 g total) by mouth 4 (four) times daily -  with meals and at bedtime for 14 days.   Tylenol 325 MG tablet Generic drug: acetaminophen Take 650 mg by mouth every 6 (six) hours as needed for moderate pain.   vitamin B-12 50 MCG tablet Commonly known as: CYANOCOBALAMIN Take 50 mcg by mouth daily.   VITAMIN D PO Take 1 tablet by mouth daily.   Xarelto 20 MG Tabs tablet Generic drug: rivaroxaban TAKE 1 TABLET BY MOUTH ONCE DAILY WITH  SUPPER         REVIEW OF SYSTEMS: Pertinent positives and negatives discussed in HPI.   Objective:   Physical Exam: BP 108/60   Pulse 65   Temp 98.3 F (36.8 C) (Temporal)   Resp 16   Ht 6' (1.829 m)   Wt 167 lb 12.8 oz (76.1 kg)   SpO2 97%   BMI 22.76 kg/m  Body mass index is 22.76 kg/m. GEN: alert, well developed HEENT: clear conjunctiva, TM grey and translucent, nose with + inferior turbinate hypertrophy, pink nasal mucosa, no rhinorrhea, + cobblestoning HEART: regular rate and rhythm, no murmur LUNGS: clear to auscultation bilaterally, no coughing, unlabored respiration ABDOMEN: soft, non distended  SKIN: no rashes or lesions  Reviewed:  12/14/2022: seen by Cardiology Dunn NP for nonobstructive CAD, mild carotid artery disease, paroxysmal AF, tobacco use.  Planning to do zio patch and nuclear stress test.   On Metoprolol, Xarelto, Dronaredone.    10/26/2022: seen by GI for abdominal pain, dysphagia, constipation, weight loss.  Has GERD on Nexium BID.  Plan to repeat EGD. EGD in 11/2022 showed abnormal esophageal motility, Barrett's esophagus and gastritis.  12/05/2022: saw Dr Barbaraann Faster PCP for bloating and abdominal pain with red meat ingestion after tick bite.  Performed alpha gal lab testing that was positive.  Alpha Gal IgE 3.31 KU/L  Skin Testing:  Skin prick testing was placed, which includes aeroallergens/foods, histamine control, and saline control.  Verbal consent was obtained prior to placing test.  Patient tolerated procedure well.  Allergy testing results were read and interpreted by myself, documented by clinical staff. Adequate positive and negative control.  Results discussed with patient/family.  Airborne Adult Perc - 12/18/22 0938     Time Antigen Placed 6962    Allergen Manufacturer Waynette Buttery    Location Back    Number of Test 55    1. Control-Buffer 50% Glycerol Negative    2. Control-Histamine 3+    3. Bahia Negative    4. French Southern Territ[place] Negative    5. Johnson Negative    6. Kentucky Blue Negative    7. Meadow Fescue Negative    8. Perennial Rye Negative    9. Timothy Negative    10. Ragweed Mix Negative    11. Cocklebur Negative    12. Plantain,  English Negative    13. Baccharis Negative    14. Dog Fennel Negative    15. Russian Thistle Negative    16. Lamb's Quarters Negative    17. Sheep Sorrell Negative    18. Rough Pigweed Negative    19. Marsh Elder, Rough Negative    20. Mugwort, Common Negative    21. Box, Elder Negative    22. Cedar, red Negative    23. Sweet Gum Negative    24. Pecan Pollen Negative    25. Pine Mix Negative    26. Walnut, Black Pollen Negative    27. Red Mulberry Negative    28. Ash Mix Negative    29. Birch Mix Negative    30. Beech American Negative    31. Cottonwood, Guinea-Bissau Negative    32. Hickory, White Negative    33. Maple Mix Negative     34. Oak, Guinea-[place] Mix Negative    35. Sycamore Eastern Negative    36. Alternaria Alternata Negative    37. Cladosporium Herbarum Negative    38. Aspergillus Mix Negative    39. Penicillium Mix Negative    40. Bipolaris Sorokiniana (Helminthosporium) Negative    41. Drechslera Spicifera (Curvularia) Negative    42. Mucor Plumbeus Negative    43. Fusarium Moniliforme Negative    44. Aureobasidium Pullulans (pullulara) Negative    45. Rhizopus Oryzae Negative    46. Botrytis Cinera Negative    47. Epicoccum Nigrum Negative    48. Phoma Betae Negative    49. Dust Mite Mix Negative    50. Cat Hair 10,000 BAU/ml Negative    51.  Dog Epithelia Negative    52. Mixed Feathers Negative    53. Horse Epithelia Negative    54. Cockroach, German Negative    55. Tobacco Leaf Negative               Assessment:   1. Allergy to alpha-gal   2. Centrilobular emphysema (HCC)   3. Nasal turbinate hypertrophy   4. Other allergic rhinitis   5. Current use of beta blocker   6. Cigarette nicotine dependence with other nicotine-induced disorder     Plan/Recommendations:  Chronic Rhinitis: - Due to turbinate hypertrophy and flare ups  with pollen exposure, performed skin testing to identify aeroallergen triggers.   - Positive skin test 12/2022: none - Use nasal saline rinses before nose sprays such as with Neilmed Sinus Rinse.  Use distilled water.   - Use Flonase 2 sprays each nostril daily. Aim upward and outward.  Alpha gal Syndrome - Initially with GI symptoms but also reports intermittent palpitations.    - please strictly avoid all mammalian meat.  If no improvement in GI symptoms after 4-6 months of avoidance, then this is less likely alpha gal syndrome as there is a 30% false positive rate with alpha gal testing.  - okay to eat seafood, chicken, [place].  - for SKIN only reaction, okay to take Benadryl 25mg  capsules every 6 hours - for SKIN + ANY additional symptoms, OR IF concern  for LIFE THREATENING reaction = Epipen Autoinjector EpiPen 0.3 mg. - If using Epinephrine autoinjector, call 911 or go to the ER.   COPD - Long standing history of smoking since age 74 with CT chest showing emphysematous changes. Discussed this is not asthma but COPD at this point.  - Try to cut back on cigarette use.  - Follow up with Pulm.   Tobacco Use Patient provided counseling on tobacco cessation today. Discussed the various impacts smoking has on health such as increased infections, lung disease, malignancy, heart disease, stroke etc. Patient is alert and competent.   Willingness to quit: thinking about it Methods/skills for cessation: wants to do it on his own slowly, has trouble with anxiety whenever he stops  Medication management for smoking: none Quit date: none Will discuss further at next follow up visit. Spent over 3 minutes for tobacco cessation counseling.      Return in about 3 months (around 03/20/2023).  Alesia Morin, MD [facility]

## 2022-12-18 NOTE — Telephone Encounter (Signed)
Pt informed

## 2022-12-19 ENCOUNTER — Encounter: Payer: Self-pay | Admitting: Acute Care

## 2022-12-19 ENCOUNTER — Ambulatory Visit (INDEPENDENT_AMBULATORY_CARE_PROVIDER_SITE_OTHER): Payer: Medicaid Other | Admitting: Acute Care

## 2022-12-19 DIAGNOSIS — F17210 Nicotine dependence, cigarettes, uncomplicated: Secondary | ICD-10-CM

## 2022-12-19 NOTE — Patient Instructions (Signed)
Thank you for participating in the [facility]. It was our pleasure to meet you today. We will call you with the results of your scan within the next few days. Your scan will be assigned a Lung RADS category score by the physicians reading the scans.  This Lung RADS score determines follow up scanning.  See below for description of categories, and follow up screening recommendations. We will be in touch to schedule your follow up screening annually or based on recommendations of our providers. We will fax a copy of your scan results to your Primary Care Physician, or the physician who referred you to the program, to ensure they have the results. Please call the office if you have any questions or concerns regarding your scanning experience or results.  Our office number is [phone]. Please speak with Denise Phelps, RN. , or  Denise Buckner RN, They are  our Lung Cancer Screening RN.'s If They are unavailable when you call, Please leave a message on the voice mail. We will return your call at our earliest convenience.This voice mail is monitored several times a day.  Remember, if your scan is normal, we will scan you annually as long as you continue to meet the criteria for the program. (Age 50-80, Current smoker or smoker who has quit within the last 15 years). If you are a smoker, remember, quitting is the single most powerful action that you can take to decrease your risk of lung cancer and other pulmonary, breathing related problems. We know quitting is hard, and we are here to help.  Please let us know if there is anything we can do to help you meet your goal of quitting. If you are a former smoker, congratulations. We are proud of you! Remain smoke free! Remember you can refer friends or family members through the number above.  We will screen them to make sure they meet criteria for the program. Thank you for helping us take better care of you by  participating in Lung Screening.  You can receive free nicotine replacement therapy ( patches, gum or mints) by calling 1-800-QUIT NOW. Please call so we can get you on the path to becoming  a non-smoker. I know it is hard, but you can do this!  Lung RADS Categories:  Lung RADS 1: no nodules or definitely non-concerning nodules.  Recommendation is for a repeat annual scan in 12 months.  Lung RADS 2:  nodules that are non-concerning in appearance and behavior with a very low likelihood of becoming an active cancer. Recommendation is for a repeat annual scan in 12 months.  Lung RADS 3: nodules that are probably non-concerning , includes nodules with a low likelihood of becoming an active cancer.  Recommendation is for a 6-month repeat screening scan. Often noted after an upper respiratory illness. We will be in touch to make sure you have no questions, and to schedule your 6-month scan.  Lung RADS 4 A: nodules with concerning findings, recommendation is most often for a follow up scan in 3 months or additional testing based on our provider's assessment of the scan. We will be in touch to make sure you have no questions and to schedule the recommended 3 month follow up scan.  Lung RADS 4 B:  indicates findings that are concerning. We will be in touch with you to schedule additional diagnostic testing based on our provider's  assessment of the scan.  Other options for assistance in smoking cessation (   As covered by your insurance benefits)  Hypnosis for smoking cessation  [employer] [phone]  Acupuncture for smoking cessation  [facility] [phone]   

## 2022-12-19 NOTE — Progress Notes (Signed)
Virtual Visit via Telephone Note  I connected with Richard [name] on 12/19/22 at  1:30 PM EDT by telephone and verified that I am speaking with the correct person using two identifiers.  Location: Patient:  At home  Provider: [address] [place], [place], [address]    I discussed the limitations, risks, security and privacy concerns of performing an evaluation and management service by telephone and the availability of in person appointments. I also discussed with the patient that there may be a patient responsible charge related to this service. The patient expressed understanding and agreed to proceed.  Shared Decision Making Visit Lung Cancer Screening Program [phone])   Eligibility: Age 57 y.o. Pack Years Smoking History Calculation 39 pack years (# packs/per year x # years smoked) Recent History of coughing up blood  no Unexplained weight loss? no ( >Than 15 pounds within the last 6 months ) Prior History Lung / other cancer no (Diagnosis within the last 5 years already requiring surveillance chest CT Scans). Smoking Status Current Smoker Former Smokers: Years since quit:  NA  Quit Date:  NA  Visit Components: Discussion included one or more decision making aids. yes Discussion included risk/benefits of screening. yes Discussion included potential follow up diagnostic testing for abnormal scans. yes Discussion included meaning and risk of over diagnosis. yes Discussion included meaning and risk of False Positives. yes Discussion included meaning of total radiation exposure. yes  Counseling Included: Importance of adherence to annual lung cancer LDCT screening. yes Impact of comorbidities on ability to participate in the program. yes Ability and willingness to under diagnostic treatment. yes  Smoking Cessation Counseling: Current Smokers:  Discussed importance of smoking cessation. yes Information about tobacco cessation classes and interventions provided to  patient. yes Patient provided with "ticket" for LDCT Scan. yes Symptomatic Patient. no  Counseling NA Diagnosis Code: Tobacco Use Z72.0 Asymptomatic Patient yes  Counseling (Intermediate counseling: > three minutes counseling) W1191 Former Smokers:  Discussed the importance of maintaining cigarette abstinence. yes Diagnosis Code: Personal History of Nicotine Dependence. Y78.295 Information about tobacco cessation classes and interventions provided to patient. Yes Patient provided with "ticket" for LDCT Scan. yes Written Order for Lung Cancer Screening with LDCT placed in Epic. Yes (CT Chest Lung Cancer Screening Low Dose W/O CM) [id number] Z12.2-Screening of respiratory organs Z87.891-Personal history of nicotine dependence  I have spent 25 minutes of face to face/ virtual visit   time with  Richard [name] discussing the risks and benefits of lung cancer screening. We viewed / discussed a power point together that explained in detail the above noted topics. We paused at intervals to allow for questions to be asked and answered to ensure understanding.We discussed that the single most powerful action that he can take to decrease his risk of developing lung cancer is to quit smoking. We discussed whether or not he is ready to commit to setting a quit date. We discussed options for tools to aid in quitting smoking including nicotine replacement therapy, non-nicotine medications, support groups, Quit Smart classes, and behavior modification. We discussed that often times setting smaller, more achievable goals, such as eliminating 1 cigarette a day for a week and then 2 cigarettes a day for a week can be helpful in slowly decreasing the number of cigarettes smoked. This allows for a sense of accomplishment as well as providing a clinical benefit. I provided  him  with smoking cessation  information  with contact information for community resources, classes, free nicotine  replacement therapy, and access to  mobile apps, text messaging, and on-line smoking cessation help. I have also provided  him  the office contact information in the event he needs to contact me, or the screening staff. We discussed the time and location of the scan, and that either Abigail Miyamoto RN, Karlton Lemon, RN  or I will call / send a letter with the results within 24-72 hours of receiving them. The patient verbalized understanding of all of  the above and had no further questions upon leaving the office. They have my contact information in the event they have any further questions.  I spent 3-4 minutes counseling on smoking cessation and the health risks of continued tobacco abuse.  I explained to the patient that there has been a high incidence of coronary artery disease noted on these exams. I explained that this is a non-gated exam therefore degree or severity cannot be determined. This patient is on statin therapy. I have asked the patient to follow-up with their PCP regarding any incidental finding of coronary artery disease and management with diet or medication as their PCP  feels is clinically indicated. The patient verbalized understanding of the above and had no further questions upon completion of the visit.      Bevelyn Ngo, NP 12/19/2022

## 2022-12-20 ENCOUNTER — Ambulatory Visit (HOSPITAL_COMMUNITY)
Admission: RE | Admit: 2022-12-20 | Discharge: 2022-12-20 | Disposition: A | Discharge status: Home / Self Care | Payer: Medicaid Other | Source: Ambulatory Visit | Attending: Family Medicine | Admitting: Family Medicine

## 2022-12-20 DIAGNOSIS — F17210 Nicotine dependence, cigarettes, uncomplicated: Secondary | ICD-10-CM | POA: Diagnosis not present

## 2022-12-20 DIAGNOSIS — Z122 Encounter for screening for malignant neoplasm of respiratory organs: Secondary | ICD-10-CM | POA: Diagnosis not present

## 2022-12-20 DIAGNOSIS — Z87891 Personal history of nicotine dependence: Secondary | ICD-10-CM

## 2022-12-21 ENCOUNTER — Ambulatory Visit (HOSPITAL_COMMUNITY)
Admission: RE | Admit: 2022-12-21 | Discharge: 2022-12-21 | Disposition: A | Discharge status: Home / Self Care | Payer: Medicaid Other | Source: Ambulatory Visit | Attending: Physician Assistant | Admitting: Physician Assistant

## 2022-12-21 ENCOUNTER — Encounter (HOSPITAL_BASED_OUTPATIENT_CLINIC_OR_DEPARTMENT_OTHER)
Admission: RE | Admit: 2022-12-21 | Discharge: 2022-12-21 | Disposition: A | Discharge status: Home / Self Care | Payer: Medicaid Other | Source: Ambulatory Visit | Attending: Physician Assistant | Admitting: Physician Assistant

## 2022-12-21 DIAGNOSIS — R072 Precordial pain: Secondary | ICD-10-CM

## 2022-12-21 LAB — NM MYOCAR MULTI W/SPECT W/WALL MOTION / EF
Base ST Depression (mm): 0 mm
LV dias vol: 112 mL (ref 62–150)
LV sys vol: 44 mL
Nuc Stress EF: 61 %
Peak HR: 82 {beats}/min
RATE: 0.3
Rest HR: 48 {beats}/min
Rest Nuclear Isotope Dose: 10.4 mCi
SDS: 3
SRS: 3
SSS: 6
ST Depression (mm): 0 mm
Stress Nuclear Isotope Dose: 31.3 mCi
TID: 1.2

## 2022-12-21 LAB — GLUCOSE, CAPILLARY: Glucose-Capillary: 113 mg/dL — ABNORMAL HIGH (ref 70–99)

## 2022-12-21 MED ORDER — TECHNETIUM TC 99M TETROFOSMIN IV KIT
10.4000 | PACK | Freq: Once | INTRAVENOUS | Status: AC | PRN
  Administered 2022-12-21: 10.4 via INTRAVENOUS

## 2022-12-21 MED ORDER — TECHNETIUM TC 99M TETROFOSMIN IV KIT
31.3000 | PACK | Freq: Once | INTRAVENOUS | Status: AC | PRN
  Administered 2022-12-21: 31.3 via INTRAVENOUS

## 2022-12-21 MED ORDER — REGADENOSON 0.4 MG/5ML IV SOLN
INTRAVENOUS | Status: AC
  Administered 2022-12-21: 0.4 mg via INTRAVENOUS
  Filled 2022-12-21: qty 5

## 2022-12-21 MED ORDER — SODIUM CHLORIDE FLUSH 0.9 % IV SOLN
INTRAVENOUS | Status: AC
  Administered 2022-12-21: 10 mL via INTRAVENOUS
  Filled 2022-12-21: qty 10

## 2022-12-25 ENCOUNTER — Telehealth: Payer: Self-pay | Admitting: Cardiology

## 2022-12-25 ENCOUNTER — Other Ambulatory Visit (HOSPITAL_COMMUNITY)
Admission: RE | Admit: 2022-12-25 | Discharge: 2022-12-25 | Disposition: A | Discharge status: Home / Self Care | Payer: Medicaid Other | Source: Ambulatory Visit | Attending: Physician Assistant | Admitting: Physician Assistant

## 2022-12-25 ENCOUNTER — Ambulatory Visit (INDEPENDENT_AMBULATORY_CARE_PROVIDER_SITE_OTHER): Payer: Medicaid Other | Admitting: Clinical

## 2022-12-25 ENCOUNTER — Telehealth: Payer: Self-pay

## 2022-12-25 DIAGNOSIS — I10 Essential (primary) hypertension: Secondary | ICD-10-CM | POA: Insufficient documentation

## 2022-12-25 DIAGNOSIS — F419 Anxiety disorder, unspecified: Secondary | ICD-10-CM

## 2022-12-25 DIAGNOSIS — F4001 Agoraphobia with panic disorder: Secondary | ICD-10-CM | POA: Diagnosis not present

## 2022-12-25 DIAGNOSIS — F331 Major depressive disorder, recurrent, moderate: Secondary | ICD-10-CM | POA: Diagnosis not present

## 2022-12-25 DIAGNOSIS — F4310 Post-traumatic stress disorder, unspecified: Secondary | ICD-10-CM

## 2022-12-25 DIAGNOSIS — Z79899 Other long term (current) drug therapy: Secondary | ICD-10-CM | POA: Diagnosis not present

## 2022-12-25 LAB — BASIC METABOLIC PANEL
Anion gap: 7 (ref 5–15)
BUN: 17 mg/dL (ref 6–20)
CO2: 26 mmol/L (ref 22–32)
Calcium: 9 mg/dL (ref 8.9–10.3)
Chloride: 103 mmol/L (ref 98–111)
Creatinine, Ser: 0.92 mg/dL (ref 0.61–1.24)
GFR, Estimated: >60 mL/min (ref >60)
Glucose, Bld: 124 mg/dL — ABNORMAL HIGH (ref 70–99)
Potassium: 4.1 mmol/L (ref 3.5–5.1)
Sodium: 136 mmol/L (ref 135–145)

## 2022-12-25 MED ORDER — SUCRALFATE 1 GM/10ML PO SUSP: 1.0000 g | Freq: Three times a day (TID) | ORAL | 0 refills | Status: DC

## 2022-12-25 NOTE — Addendum Note (Signed)
Addended by: Gelene Mink on: 12/25/2022 12:06 PM   Modules accepted: Orders

## 2022-12-25 NOTE — Telephone Encounter (Signed)
Pt is needing help getting his monitor on right.   Please call him so he can bring it in

## 2022-12-25 NOTE — Progress Notes (Signed)
Virtual Visit via Video Note   I connected with Richard [name] on 12/25/22 at 1:00 PM EST by a video enabled telemedicine application and verified that I am speaking with the correct person using two identifiers.   Location: Patient: Home Provider: Office   I discussed the limitations of evaluation and management by telemedicine and the availability of in person appointments. The patient expressed understanding and agreed to proceed.   THERAPIST PROGRESS NOTE     Session Time: 1:00 PM-1:45 PM   Participation Level: Active   Behavioral Response: Casual and Alert,Anxious   Type of Therapy: Individual Therapy   Treatment Goals addressed: Mood and Anxiety   Interventions: CBT   Summary: Richard [name] is a 57 y.o. male who presents with panic disorder/depression with anxiety/and PTSD . The OPT therapist worked with the patient for his OPT treatment. The OPT therapist utilized Motivational Interviewing to assist in creating therapeutic repore. The patient in the session was engaged and work in collaboration giving feedback about his triggers and symptoms over the past few weeks The patient spoke about ongoing health conditions that trigger his anxiety .The OPT therapist utilized Cognitive Behavioral Therapy through cognitive restructuring as well as worked with the patient on coping strategies to assist in management of symptoms as well as reviewed sleep, eating habits, and general health. The patient continues to work on acceptance of limited mobility based on health conditions.The patients spoke about his ongoing work with smoking cessation and not eating red meat due to his health problems.The OPT therapist overviewed upcoming health appointments as listed in the patients MyChart.   Suicidal/Homicidal: Nowithout intent/plan   Therapist Response:The OPT therapist worked with the patient for the patients scheduled session. The patient was engaged in his session and gave feedback in relation  to triggers, symptoms, and behavior responses over the past few weeks. The OPT therapist worked with the patient utilizing an in session Cognitive Behavioral Therapy exercise. The patient was responsive in the session and verbalized, "Things have gotten a little better with the finances since my SSI check , but my health has been a stressor I have a health problem related to tick bite and so I am on a strict diet as part of the recovery plan". The patient spoke about additional ED visits since his last visit due to chest pain. The OPT therapist worked with the patient in this session providing support and reviewing with the patient and placing emphasis on the patient working to remain active while using his judgement with the approval of his physican as the patient has cardiac health condition and utilizing his support symptom. The patient spoke about having recently to take more Xanax to help with his anxiety from his health condition. The OPT therapist worked with the patient on coping strategies to assist in management of his mood and anxiety. The patient has completed recent lung test and is awaiting the results this week.The OPT therapist will continue treatment work with the patient in his next scheduled session   Plan: Return again in 2/3 weeks.   Diagnosis:      Axis I: PTSD/Panic Disorder/ Depression  Axis II: No diagnosis      Collaboration of Care: No additional collaboration of care for this session.   Patient/Guardian was advised Release of Information must be obtained prior to any record release in order to collaborate their care with an outside provider. Patient/Guardian was advised if they have not already done so to contact the registration department  to sign all necessary forms in order for [name] to release information regarding their care.    Consent: Patient/Guardian gives verbal consent for treatment and assignment of benefits for services provided during this visit. Patient/Guardian  expressed understanding and agreed to proceed.    I discussed the assessment and treatment plan with the patient. The patient was provided an opportunity to ask questions and all were answered. The patient agreed with the plan and demonstrated an understanding of the instructions.   The patient was advised to call back or seek an in-person evaluation if the symptoms worsen or if the condition fails to improve as anticipated.   I provided 45 minutes of non-face-to-face time during this encounter.   [name], LCSW    12/25/2022

## 2022-12-25 NOTE — Telephone Encounter (Signed)
Pt wants a refill on his Carafate. Was refilled on 5/24. Pt's last ov was 4/11.

## 2022-12-25 NOTE — Telephone Encounter (Signed)
Pt scheduled for nurse visit to place monitor.

## 2022-12-26 ENCOUNTER — Ambulatory Visit: Payer: Medicaid Other | Attending: Cardiology | Admitting: *Deleted

## 2022-12-26 DIAGNOSIS — R002 Palpitations: Secondary | ICD-10-CM

## 2022-12-26 NOTE — Progress Notes (Signed)
Zio monitor placed and pt given instructions.

## 2022-12-27 ENCOUNTER — Other Ambulatory Visit: Payer: Self-pay

## 2022-12-27 DIAGNOSIS — Z87891 Personal history of nicotine dependence: Secondary | ICD-10-CM

## 2022-12-27 DIAGNOSIS — Z122 Encounter for screening for malignant neoplasm of respiratory organs: Secondary | ICD-10-CM

## 2022-12-27 DIAGNOSIS — F17210 Nicotine dependence, cigarettes, uncomplicated: Secondary | ICD-10-CM

## 2023-01-01 NOTE — Progress Notes (Signed)
Subjective:    Patient ID: Richard [name], male    DOB: 09-Dec-1965,    MRN: [id number]  HPI  46 yowm active smoker with onset of doe around 2011 much worse x 2015-16 to point where has trouble to walking to mailbox x 169ft slt uphill to MB  so referred to pulmonary clinic 11/17/2015 by Dr Ledell Peoples.   11/17/2015 1st [facility] office visit/ Richard [name]  On advair 250 bid  Chief Complaint  Patient presents with   pulmonary consult    pt ref by dr. Ledell Peoples for SOB. dry cough occ prod, wheezing mainly @ night, occ cp.   indolent onset doe x 6 years worse with certain smells and some better on advair/ventolin as long as avoids exertion but  wheezing every night x one year disturbs sleep On nexium bid but not ac Rec Plan A = Automatic = Symbicort 80 Take 2 puffs first thing in am and then another 2 puffs about 12 hours later.  Plan B = Backup Only use your albuterol(ventolin)  as a rescue medication  Stop lisinopril and start valsartan 80 mg one daily in its place   nexium Take 30- 60 min before your first and last meals of the day  GERD diet  Please schedule a follow up office visit in 4 weeks, sooner if needed > did not return as rec   03/14/2022  Re-establish ov/[place] office/Richard [name] re: AB maint on saba prn   Chief Complaint  Patient presents with   Consult    Consult for pulm emphysema. Lung pain and SOB   Dyspnea:  several aisles at [employer] variable speeds then gives out  Cough: varies during the day, not in AM/ just mucoid / powdered inhalers make it worse  Sleeping: bed has 2.5 inches or bad gerd  SABA use: up to 3 x days , never prechallenges 02: checks levels 20 min p arrives  Covid status: no vaccines, never infected  Lung cancer screening: had ct 09/2021 Rec Nexium 40 mg Take 30- 60 min before your first and last meals of the day  GERD diet reviewed, bed blocks rec  Breztri one puff every 12 hours - if helps can try dulera or symbiocrt thru patient assistance Only use your  albuterol as a rescue medication  Ok to try albuterol 15 min before an activity (on alternating days)  that you know would usually make you short of breath     04/25/2022  f/u ov/[place] office/Richard [name] re: doe  maint on nothing  / breztri made him cough Chief Complaint  Patient presents with   Follow-up    Breathing has not changed since last ov   Dyspnea:  [employer] walking /5 squats / able to cross a half parking   Cough: clear phlegy Sleeping: slt elevation  SABA use: none on day of, up to twice daily as needed but does not pre-challenge or rechallenge as rec  02: none  Covid status: never 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  x 5 years > filed for disability  Rec Plan A = Automatic = Always=    Stiolto 2 puffs each am daily  Work on inhaler technique:   Plan B = Backup (to supplement plan A, not to replace it) Only use your albuterol inhaler as a rescue medication Ok to try albuterol 15 min before an activity (on alternating days)  that you know would usually make you short of breath   Get rid of the mold and cigarettes  Pulmonary  follow up is a needed    01/02/2023  f/u ov/[place] office/Richard [name] re: doe still smoking  maint on prn saba   Chief Complaint  Patient presents with   Follow-up  Dyspnea:  highly variable / worse in heat Cough: better off stiolto  Sleeping: bed blocks no resp cc SABA use: ventolin seems to help the most maybe up to twice daily   02: none    Lung cancer screening: 12/20/22 see below   No obvious day to day or daytime variability or assoc excess/ purulent sputum or mucus plugs or hemoptysis or cp or chest tightness, subjective wheeze or overt sinus or hb symptoms.   Sleeping  without nocturnal  or early am exacerbation  of respiratory  c/o's or need for noct saba. Also denies any obvious fluctuation of symptoms with weather or environmental changes or other aggravating or alleviating factors except as outlined above   No unusual exposure hx or h/o  childhood pna/ asthma or knowledge of premature birth.  Current Allergies, Complete Past Medical History, Past Surgical History, Family History, and Social History were reviewed in [employer] record.  ROS  The following are not active complaints unless bolded Hoarseness, sore throat, dysphagia, dental problems, itching, sneezing,  nasal congestion or discharge of excess mucus or purulent secretions, ear ache,   fever, chills, sweats, unintended wt loss or wt gain, classically pleuritic or exertional cp,  orthopnea pnd or arm/hand swelling  or leg swelling, presyncope, palpitations, abdominal pain, anorexia, nausea, vomiting, diarrhea  or change in bowel habits or change in bladder habits, change in stools or change in urine, dysuria, hematuria,  rash, arthralgias, visual complaints, headache, numbness, weakness or ataxia or problems with walking or coordination,  change in mood or  memory.        Current Meds  Medication Sig   acetaminophen (TYLENOL) 325 MG tablet Take 650 mg by mouth every 6 (six) hours as needed for moderate pain.   acidophilus (RISAQUAD) CAPS capsule Take 1 capsule by mouth daily.   albuterol (VENTOLIN HFA) 108 (90 Base) MCG/ACT inhaler INHALE 2 PUFFS BY MOUTH EVERY 6 HOURS AS NEEDED FOR COUGHING, WHEEZING, OR SHORTNESS OF BREATH   ALPRAZolam (XANAX) 0.5 MG tablet Take one twice a day and two at bedtime   amLODipine (NORVASC) 10 MG tablet Take 1 tablet by mouth once daily   dronedarone (MULTAQ) 400 MG tablet Take 1 tablet (400 mg total) by mouth 2 (two) times daily with a meal.   EPINEPHrine 0.3 mg/0.3 mL IJ SOAJ injection Inject 0.3 mg into the muscle as needed for anaphylaxis.   esomeprazole (NEXIUM) 20 MG capsule Take 20 mg by mouth 2 (two) times daily. 2 caps am, 2 caps pm   famotidine (PEPCID) 20 MG tablet Take 20 mg by mouth as needed for heartburn or indigestion.   fluticasone (FLONASE) 50 MCG/ACT nasal spray Place 2 sprays into both nostrils  daily.   metoprolol succinate (TOPROL XL) 25 MG 24 hr tablet Take 0.5 tablets (12.5 mg total) by mouth daily.   nitroGLYCERIN (NITROSTAT) 0.4 MG SL tablet Place 1 tablet (0.4 mg total) under the tongue every 5 (five) minutes x 3 doses as needed for chest pain (if no relief after 3rd dose, proceed to the ED for an evaluation).   potassium chloride SA (KLOR-CON M) 20 MEQ tablet Take 1 tablet (20 mEq total) by mouth daily.   rivaroxaban (XARELTO) 20 MG TABS tablet TAKE 1 TABLET BY MOUTH ONCE DAILY WITH  SUPPER   simvastatin (ZOCOR) 20 MG tablet Take 1 tablet by mouth once daily   sucralfate (CARAFATE) 1 GM/10ML suspension Take 10 mLs (1 g total) by mouth 4 (four) times daily -  with meals and at bedtime for 14 days.   Tiotropium Bromide-Olodaterol (STIOLTO RESPIMAT) 2.5-2.5 MCG/ACT AERS Inhale 2 puffs into the lungs daily.   vitamin B-12 (CYANOCOBALAMIN) 50 MCG tablet Take 50 mcg by mouth daily.   VITAMIN D PO Take 1 tablet by mouth daily.                     Objective:   Physical Exam   Wts  01/02/2023         170  04/25/2022      173  03/14/2022        175  11/17/15 207 lb 9.6 oz (94.167 kg)  10/14/15 205 lb 6.4 oz (93.169 kg)  10/07/15 204 lb (92.534 kg)      Vital signs reviewed  01/02/2023  - Note at rest 02 sats  97% on RA   General appearance:    amb wm nad   HEENT : Oropharynx  clear       NECK :  without  apparent JVD/ palpable Nodes/TM    LUNGS: no acc muscle use,  Nl contour chest which is clear to A and P bilaterally without cough on insp or exp maneuvers   CV:  RRR  no s3 or murmur or increase in P2, and no edema   ABD:  soft and nontender with nl inspiratory excursion in the supine position. No bruits or organomegaly appreciated   MS:  Nl gait/ ext warm without deformities Or obvious joint restrictions  calf tenderness, cyanosis or clubbing    SKIN: warm and dry without lesions    NEURO:  alert, approp, nl sensorium with  no motor or cerebellar deficits  apparent.        I personally reviewed images and agree with radiology impression as follows:  Chest LDSCT    12/20/22  Lung-RADS 1, negative. Continue annual screening with low-dose chest CT without contrast in 12 months.  Aortic Atherosclerosis (ICD10-I70.0) and Emphysema (ICD10-J43.9).      Assessment & Plan:

## 2023-01-02 ENCOUNTER — Ambulatory Visit (INDEPENDENT_AMBULATORY_CARE_PROVIDER_SITE_OTHER): Payer: Medicaid Other | Admitting: Internal Medicine

## 2023-01-02 ENCOUNTER — Encounter: Payer: Self-pay | Admitting: Internal Medicine

## 2023-01-02 VITALS — BP 119/68 | HR 65 | Ht 71.0 in | Wt 170.2 lb

## 2023-01-02 DIAGNOSIS — R058 Other specified cough: Secondary | ICD-10-CM

## 2023-01-02 DIAGNOSIS — R0609 Other forms of dyspnea: Secondary | ICD-10-CM

## 2023-01-02 MED ORDER — BUDESONIDE-FORMOTEROL FUMARATE 80-4.5 MCG/ACT IN AERO: INHALATION_SPRAY | RESPIRATORY_TRACT | 12 refills | Status: DC

## 2023-01-02 NOTE — Assessment & Plan Note (Signed)
Active smoker Spirometry 05/05/15   FEV1 3.47 (85%)  Ratio 74  - 11/17/2015   try symb 80 2bid and stop advair  - 03/14/2022   Walked on RA  x  3  lap(s) =  approx 450  ft  @ fast pace, stopped due to end of study with lowest 02 sats 97% min sob   - 03/14/2022  After extensive coaching inhaler device,  effectiveness =    80% HFA so try off advair dpi  And on symbicort 80 or dulera 100 2bid (breztri sample given for teaching purposes  - 04/25/2022  try stiolto> caused cough  Chest LDSCT    12/20/22 Mild centrilobular and paraseptal emphysematous changes, upper lung predominant. - 01/02/2023  trial of symbicort 80 2bid prn  Hx of highly variable doe is more typical of AB than copd which would fit with prior nl pfts so rec symbicort 80 up to 2bid Based on two studies from NEJM  378; 20 p 1865 (2018) and 380 : p2020-30 (2019) in pts with mild asthma it is reasonable to use low dose symbicort eg 80 2bid "prn" flare in this setting but I emphasized this was only shown with symbicort and takes advantage of the rapid onset of action but is not the same as "rescue therapy" but can be stopped once the acute symptoms have resolved and the need for rescue has been minimized (< 2 x weekly)

## 2023-01-02 NOTE — Patient Instructions (Addendum)
Stop stiolto   Stop smoking completely before smoking completely stops you!    Plan A = Automatic = Always=    Symbicort 80 Take 2 puffs first thing in am and then another 2 puffs about 12 hours later take for at least a week to get full benefit before considering any taper to 0-2 puffs every 12 hours as needed   Work on inhaler technique:  relax and gently blow all the way out then take a nice smooth full deep breath back in, triggering the inhaler at same time you start breathing in.  Hold breath in for at least  5 seconds if you can. Blow out symbicort  thru nose. Rinse and gargle with water when done.  If mouth or throat bother you at all,  try brushing teeth/gums/tongue with arm and hammer toothpaste/ make a slurry and gargle and spit out.      Plan B = Backup (to supplement plan A, not to replace it) Only use your albuterol inhaler as a rescue medication to be used if you can't catch your breath by resting or doing a relaxed purse lip breathing pattern.  - The less you use it, the better it will work when you need it. - Ok to use the inhaler up to 2 puffs  every 4 hours if you must but call for appointment if use goes up over your usual need - Don't leave home without it !!  (think of it like the spare tire for your car)     Please schedule a follow up visit in 6 months but call sooner if needed

## 2023-01-02 NOTE — Assessment & Plan Note (Signed)
Quit ACEi  11/2015  - intol of DPI's  Reported 03/14/2022  - max rx for GERD 03/14/2022 >>> improved as of 01/02/2023 > try adding sym b80 2bid and if cough flares more likely this is UACS than asthma related > f/u with allergy either way and here q 6 m, sooner prn          Each maintenance medication was reviewed in detail including emphasizing most importantly the difference between maintenance and prns and under what circumstances the prns are to be triggered using an action plan format where appropriate.  Total time for H and P, chart review, counseling, reviewing hfa  device(s) and generating customized AVS unique to this office visit / same day charting = 30 min

## 2023-01-12 ENCOUNTER — Telehealth: Payer: Self-pay

## 2023-01-12 NOTE — Telephone Encounter (Signed)
Patient notified his 90 day supply of multaq has arrived from pt assistance.

## 2023-01-15 DIAGNOSIS — Z419 Encounter for procedure for purposes other than remedying health state, unspecified: Secondary | ICD-10-CM | POA: Diagnosis not present

## 2023-01-17 ENCOUNTER — Telehealth: Payer: Self-pay | Admitting: Gastroenterology

## 2023-01-17 NOTE — Telephone Encounter (Signed)
Richard [name],  Referral in [place] from [facility] for a esophageal manometry for acid reflux.  Records in EPIC.  Please advise scheduling.  Thanks!

## 2023-01-19 ENCOUNTER — Other Ambulatory Visit: Payer: Self-pay

## 2023-01-19 DIAGNOSIS — K219 Gastro-esophageal reflux disease without esophagitis: Secondary | ICD-10-CM

## 2023-01-19 NOTE — Telephone Encounter (Signed)
Referral for esophageal manometry Referring provider Lewie Loron, NP of [facility] Appointment and procedure information to the patient.

## 2023-01-22 ENCOUNTER — Ambulatory Visit (INDEPENDENT_AMBULATORY_CARE_PROVIDER_SITE_OTHER): Payer: Medicaid Other | Admitting: Clinical

## 2023-01-22 DIAGNOSIS — F419 Anxiety disorder, unspecified: Secondary | ICD-10-CM | POA: Diagnosis not present

## 2023-01-22 DIAGNOSIS — F4001 Agoraphobia with panic disorder: Secondary | ICD-10-CM

## 2023-01-22 DIAGNOSIS — F331 Major depressive disorder, recurrent, moderate: Secondary | ICD-10-CM

## 2023-01-22 DIAGNOSIS — F4310 Post-traumatic stress disorder, unspecified: Secondary | ICD-10-CM | POA: Diagnosis not present

## 2023-01-22 NOTE — Progress Notes (Signed)
Virtual Visit via Video Note   I connected with Richard [name] on 01/22/23 at 3:00 PM EST by a video enabled telemedicine application and verified that I am speaking with the correct person using two identifiers.   Location: Patient: Home Provider: Office   I discussed the limitations of evaluation and management by telemedicine and the availability of in person appointments. The patient expressed understanding and agreed to proceed.   THERAPIST PROGRESS NOTE     Session Time: 3:00 PM-3:30 PM   Participation Level: Active   Behavioral Response: Casual and Alert,Anxious   Type of Therapy: Individual Therapy   Treatment Goals addressed: Mood and Anxiety   Interventions: CBT   Summary: Richard [name] is a 57 y.o. male who presents with panic disorder/depression with anxiety/and PTSD . The OPT therapist worked with the patient for his OPT treatment. The OPT therapist utilized Motivational Interviewing to assist in creating therapeutic repore. The patient in the session was engaged and work in collaboration giving feedback about his triggers and symptoms over the past few weeks The patient spoke about ongoing health conditions that trigger his anxiety, his wifes upcoming surgery procedure, and frustration with still getting use to dentures .The OPT therapist utilized Cognitive Behavioral Therapy through cognitive restructuring as well as worked with the patient on coping strategies to assist in management of symptoms as well as reviewed sleep, eating habits, and general health. The patient continues to work on acceptance of limited mobility based on health conditions.The patients spoke about his ongoing work with smoking cessation and not eating red meat due to his health problems.The OPT therapist overviewed upcoming health appointments as listed in the patients MyChart.   Suicidal/Homicidal: Nowithout intent/plan   Therapist Response:The OPT therapist worked with the patient for the  patients scheduled session. The patient was engaged in his session and gave feedback in relation to triggers, symptoms, and behavior responses over the past few weeks. The OPT therapist worked with the patient utilizing an in session Cognitive Behavioral Therapy exercise. The patient was responsive in the session and verbalized, "Things have been ok overall but I am about sick of eating chicken since I can't eat red meat due to the tick bite... My wife has a upcoming surgery and I am getting prepared for that and taking over things she normally handles so they will get done once she is recovering". The patient spoke about planning his activities to avoid being out in excessive heat.The OPT therapist worked with the patient providing ongoing psychotherapy/education and coping skills review.The OPT therapist will continue treatment work with the patient in his next scheduled session   Plan: Return again in 2/3 weeks.   Diagnosis:      Axis I: PTSD/Panic Disorder/ Depression with anxiety  Axis II: No diagnosis      Collaboration of Care: No additional collaboration of care for this session.   Patient/Guardian was advised Release of Information must be obtained prior to any record release in order to collaborate their care with an outside provider. Patient/Guardian was advised if they have not already done so to contact the registration department to sign all necessary forms in order for [name] to release information regarding their care.    Consent: Patient/Guardian gives verbal consent for treatment and assignment of benefits for services provided during this visit. Patient/Guardian expressed understanding and agreed to proceed.    I discussed the assessment and treatment plan with the patient. The patient was provided an opportunity to ask questions and  all were answered. The patient agreed with the plan and demonstrated an understanding of the instructions.   The patient was advised to call back or seek  an in-person evaluation if the symptoms worsen or if the condition fails to improve as anticipated.   I provided 30 minutes of non-face-to-face time during this encounter.   [name], LCSW    01/22/2023

## 2023-01-24 NOTE — Progress Notes (Unsigned)
Cardiology Office Note    Date:  01/25/2023  ID:  Richard [name], DOB 06/07/1966, MRN [id number] Cardiologist: Richard Dell, MD   EP: Dr. Ladona [name]  History of Present Illness:    Richard [name] is a 57 y.o. male with past medical history of CAD (nonobstructive disease by cath in 07/2015, low-risk NST in 12/2020), paroxysmal atrial fibrillation, carotid artery disease, HTN, HLD, asthma, anxiety and depression who presents to the office today for 6-week follow-up.   He was last examined by Richard Spies, PA in 11/2022 following a recent Emergency Department visit for chest pain and palpitations. Reported his pain persisted for several hours and was not worse with exertion or positional changes. Reported taking a Xanax at times which would help with symptoms. Given his known CAD, a Lexiscan Myoview was recommended for further evaluation. It was also recommended that he have a 2-week Zio patch to assess his atrial fibrillation burden. His stress test showed no evidence of ischemia and was overall a low-risk study. His Zio patch showed predominantly normal sinus rhythm with an average heart rate of 56 bpm. He did have less than 1% atrial fibrillation/flutter burden and no prolonged pauses.  In talking with the patient and his wife today, he reports things have improved since his last office visit. He does experience occasional palpitations but feels like this occurs after he has consumed food and he has a tightness along his epigastric region and feels like something is bulging in the direction of his heart. He did see GI earlier today and was started on Baclofen to see if this would help with his symptoms.  Denies any exertional chest pain.  No recent orthopnea, PND or pitting edema. He was diagnosed with alpha gal and has made dietary changes with this.  Studies Reviewed:   EKG: EKG is not ordered today.  Echocardiogram: 07/2021 IMPRESSIONS     1. Left ventricular ejection fraction, by estimation,  is 60 to 65%. The  left ventricle has normal function. The left ventricle has no regional  wall motion abnormalities. Left ventricular diastolic parameters were  normal.   2. Right ventricular systolic function is normal. The right ventricular  size is normal. Tricuspid regurgitation signal is inadequate for assessing  PA pressure.   3. The mitral valve is normal in structure. No evidence of mitral valve  regurgitation. No evidence of mitral stenosis.   4. The aortic valve was not well visualized. Aortic valve regurgitation  is not visualized. No aortic stenosis is present.   5. The inferior vena cava is dilated in size with >50% respiratory  variability, suggesting right atrial pressure of 8 mmHg.   NST: 12/2022   Stress ECG is negative for ischemia and arrhythmias. Patient experienced chest pain during the stress test.   LV perfusion is abnormal. There is no evidence of ischemia. There is no evidence of infarction. There is a medium sized perfusion defect with moderate reduction in uptake present in the apical to basal inferior wall (more intensified on rest images) consistent with soft tissue attenuation.   Left ventricular function is normal. Nuclear stress EF: 61 %.   Findings are consistent with no ischemia. The study is low risk.  Event Monitor: 12/2022 NSR with sinus brady (45/min) and sinus tachy (126/min) with ave HR 56/min. Less than 1% atrial fib/flutter with a slow VR (54/min), CVR and RVR (126/min) Rare PVC's and PAC's (less than 1%) No prolonged pauses No VT or SVT   Richard Taylor,MD Patch  Wear Time:  12 days and 21 hours (2024-06-11T11:20:27-0400 to 2024-06-24T08:21:03-399)   Risk Assessment/Calculations:    CHA2DS2-VASc Score = 5   This indicates a 7.2% annual risk of stroke. The patient's score is based upon: CHF History: 0 HTN History: 1 Diabetes History: 1 Stroke History: 2 Vascular Disease History: 1 Age Score: 0 Gender Score: 0    Physical Exam:    VS:  BP (!) 110/58   Pulse 60   Ht 6' (1.829 m)   Wt 171 lb 6.4 oz (77.7 kg)   SpO2 96%   BMI 23.25 kg/m    Wt Readings from Last 3 Encounters:  01/25/23 171 lb 6.4 oz (77.7 kg)  01/25/23 171 lb 12.8 oz (77.9 kg)  01/02/23 170 lb 3.2 oz (77.2 kg)     GEN: Well nourished, well developed male appearing in no acute distress NECK: No JVD; No carotid bruits CARDIAC: RRR, no murmurs, rubs, gallops RESPIRATORY:  Clear to auscultation without rales, wheezing or rhonchi  ABDOMEN: Appears non-distended. No obvious abdominal masses. EXTREMITIES: No clubbing or cyanosis. No pitting edema.  Distal pedal pulses are 2+ bilaterally.   Assessment and Plan:   1. CAD - He had nonobstructive disease by cath in 07/2015 and most recent ischemic evaluation was a low-risk NST last month. He does report occasional discomfort when he feels a bulging sensation from his esophagus in the direction of his heart with food consumption and was evaluated by GI earlier today and started on Baclofen. No specific exertional symptoms. Given his recent reassuring NST and atypical symptoms, would not plan for repeat ischemic testing at this time. - He is no longer on ASA given the need for anticoagulation. Continue Toprol-XL 12.5 mg daily and Simvastatin 20 mg daily.  2. Paroxysmal Atrial Fibrillation - Recent monitor showed a < 1% AF burden. Continue Toprol-XL 12.5mg  daily and Multaq 400mg  BID (followed by EP). - No reports of active bleeding. Continue Xarelto 20mg  daily for anticoagulation.   3. Carotid Artery Stenosis - Dopplers in 2022 showed mild 1-49% stenosis along the RICA and no significant stenosis along the LICA. CTA Neck in 07/2021 showed no significant stenosis.  He does not have carotid bruits on examination today.  Would anticipate repeat carotid dopplers in 1 to 2 years. Continue statin therapy.   4. HTN - Blood pressure is well-controlled at 110/58 during today's visit. Continue current medical  therapy with Amlodipine 10 mg daily and Toprol-XL 12.5 mg daily.  5. HLD - FLP in 10/2022 showed his LDL was at 58. Continue current medical therapy with Simvastatin 20 mg daily.   Signed, Richard Lennox, PA-C

## 2023-01-25 ENCOUNTER — Ambulatory Visit (INDEPENDENT_AMBULATORY_CARE_PROVIDER_SITE_OTHER): Payer: Medicaid Other | Admitting: Gastroenterology

## 2023-01-25 ENCOUNTER — Ambulatory Visit: Payer: Medicaid Other | Attending: Student | Admitting: Student

## 2023-01-25 ENCOUNTER — Encounter: Payer: Self-pay | Admitting: Student

## 2023-01-25 ENCOUNTER — Encounter: Payer: Self-pay | Admitting: Gastroenterology

## 2023-01-25 VITALS — BP 110/58 | HR 60 | Ht 72.0 in | Wt 171.4 lb

## 2023-01-25 VITALS — BP 128/71 | HR 60 | Temp 98.1°F | Ht 72.0 in | Wt 171.8 lb

## 2023-01-25 DIAGNOSIS — K219 Gastro-esophageal reflux disease without esophagitis: Secondary | ICD-10-CM

## 2023-01-25 DIAGNOSIS — I2510 Atherosclerotic heart disease of native coronary artery without angina pectoris: Secondary | ICD-10-CM | POA: Diagnosis not present

## 2023-01-25 DIAGNOSIS — I6523 Occlusion and stenosis of bilateral carotid arteries: Secondary | ICD-10-CM

## 2023-01-25 DIAGNOSIS — E785 Hyperlipidemia, unspecified: Secondary | ICD-10-CM

## 2023-01-25 DIAGNOSIS — I10 Essential (primary) hypertension: Secondary | ICD-10-CM | POA: Diagnosis not present

## 2023-01-25 DIAGNOSIS — I480 Paroxysmal atrial fibrillation: Secondary | ICD-10-CM

## 2023-01-25 MED ORDER — BACLOFEN 10 MG PO TABS: 10.0000 mg | ORAL_TABLET | Freq: Two times a day (BID) | ORAL | 1 refills | Status: DC

## 2023-01-25 NOTE — Patient Instructions (Signed)
Continue Nexium twice a day.  I have sent in Baclofen low dose to take 30 minutes before breakfast and dinner. Monitor for any fatigue, drowsiness, dizziness.   We will see you in 3 months!  I enjoyed seeing you again today! I value our relationship and want to provide genuine, compassionate, and quality care. You may receive a survey regarding your visit with me, and I welcome your feedback! Thanks so much for taking the time to complete this. I look forward to seeing you again.      Gelene Mink, PhD, ANP-BC [facility]

## 2023-01-25 NOTE — Patient Instructions (Signed)
Medication Instructions:  Your physician recommends that you continue on your current medications as directed. Please refer to the Current Medication list given to you today.  *If you need a refill on your cardiac medications before your next appointment, please call your pharmacy*   Lab Work: None If you have labs (blood work) drawn today and your tests are completely normal, you will receive your results only by: MyChart Message (if you have MyChart) OR A paper copy in the mail If you have any lab test that is abnormal or we need to change your treatment, we will call you to review the results.   Testing/Procedures: None   Follow-Up: At [facility], you and your health needs are our priority.  As part of our continuing mission to provide you with exceptional heart care, we have created designated Provider Care Teams.  These Care Teams include your primary Cardiologist (physician) and Advanced Practice Providers (APPs -  Physician Assistants and Nurse Practitioners) who all work together to provide you with the care you need, when you need it.  We recommend signing up for the patient portal called "MyChart".  Sign up information is provided on this After Visit Summary.  MyChart is used to connect with patients for Virtual Visits (Telemedicine).  Patients are able to view lab/test results, encounter notes, upcoming appointments, etc.  Non-urgent messages can be sent to your provider as well.   To learn more about what you can do with MyChart, go to [email or web address].    Your next appointment:   6 month(s)  Provider:   Nona Dell, MD    Other Instructions Follow up with Dr. Ladona Ridgel in October 2024.

## 2023-01-25 NOTE — Progress Notes (Signed)
Gastroenterology Office Note     Primary Care Physician:  Gilmore Laroche, FNP  Primary Gastroenterologist: Dr. Marletta Lor    Chief Complaint   Chief Complaint  Patient presents with   Follow-up    Still having issues with knots/swelling in chest after eating.     History of Present Illness   Richard [name] is a 57 y.o. male presenting today with a history of chronic GERD, abdominal pain, dysphagia, constipation, weight loss. 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 CTA unremarkable in the past. HIDA with borderline EF of 33%.   Due to worsening dysphagia, he underwent early interval EGD May 2024 as noted below.    Xanax BID. Hands shaking bilaterally. Increaesd HR if angry, sad, happy, nervous. Unable to do manometry as would have to hold Xanax day prior and morning of manometry. Unable to drive in [place]. Wife unable to drive in [place].   Alpha gal panel positive: avoiding allergens.   Nexium BID. Sometimes adds pepcid. Denies solid food dysphagia. Sometimes pill dysphagia. States 20-30 minutes after eating will have a "swelling" feeling in sternum, then his HR will increase. Doesn't happen every time. Puts pressure in center of back and can feel the swelling in his sternum. Antacid will help relieve sometimes.     EGD May 2024: abnormal esophageal motility, consistent with presbyesophagus, suspicious changes for Barrett's but negative path, gastritis, normal duodenum. Negative H.pylori.      EGD 07/2021: small hiatal hernia, abnormal esophageal motility, iron pill gastritis, no h.pylori, mucosal variant in duodenum (bx with focal lymphangiectasia, neg for celiac)  Last colonoscopy 2018, 1 benign polyp removed, recommended 10-year recall.      Past Medical History:  Diagnosis Date   Arthritis    Asthma    CAD (coronary artery disease)    a. Cardiac cath 07/2015 showed 65% distal Cx, 20% mid-distal LAD, 20% prox-distal RCA,  EF 60%, EDP .   Colitis 1990   COPD (chronic obstructive pulmonary disease) (HCC)    Depression    Dysrhythmia    Essential hypertension    Fatty liver    Gastric ulcer 2003; 2012   2003: + esophagitis; negative H.pylori serology  2012: Dr. Darrick Penna, mild gastritis, Bravo PH probe placement, negative H.pylori   GERD (gastroesophageal reflux disease)    Hepatic steatosis    History of hiatal hernia    Hyperlipemia    Overweight    Panic attacks    Paroxysmal atrial fibrillation (HCC)    Pulmonary nodules    Stroke (HCC)    TIA (transient ischemic attack)    Type II diabetes mellitus (HCC)     Past Surgical History:  Procedure Laterality Date   BALLOON DILATION N/A 07/25/2021   Procedure: BALLOON DILATION;  Surgeon: Lanelle Bal, DO;  [facility];  Service: Endoscopy;  Laterality: N/A;   BIOPSY  07/25/2021   Procedure: BIOPSY;  Surgeon: Lanelle Bal, DO;  [facility];  Service: Endoscopy;;   BIOPSY  12/04/2022   Procedure: BIOPSY;  Surgeon: Lanelle Bal, DO;  [facility];  Service: Endoscopy;;   BRAVO PH STUDY  05/03/2011   WUJ:WJXB gastritis/normal esophagus and duodenum   CARDIAC CATHETERIZATION  1990s X 1; 2005; 08/12/2015   CARDIAC CATHETERIZATION N/A 08/12/2015   Procedure: Left Heart Cath and Coronary Angiography;  Surgeon: Lyn Records, MD; LAD 20%, CFX 65%, RCA 20%, EF 60%    COLONOSCOPY  1990   COLONOSCOPY  WITH PROPOFOL N/A 11/21/2016   Dr. Darrick Penna: non-thrombosed external hemorrhoids, one 6 mm polyp (polypoid lesion), internal hemorrhoids. TI Normal. 10 years screening   ESOPHAGOGASTRODUODENOSCOPY  05/03/2011   BJY:NWGN gastritis   ESOPHAGOGASTRODUODENOSCOPY (EGD) WITH PROPOFOL N/A 07/25/2021   Procedure: ESOPHAGOGASTRODUODENOSCOPY (EGD) WITH PROPOFOL;  Surgeon: Lanelle Bal, DO;  [facility];  Service: Endoscopy;  Laterality: N/A;  1:30pm   ESOPHAGOGASTRODUODENOSCOPY (EGD) WITH PROPOFOL N/A 12/04/2022    Procedure: ESOPHAGOGASTRODUODENOSCOPY (EGD) WITH PROPOFOL;  Surgeon: Lanelle Bal, DO;  [facility];  Service: Endoscopy;  Laterality: N/A;  8:00AM;ASA 3   NECK MASS EXCISION Right    "done in dr's office; behind right ear/side of ncek"   POLYPECTOMY  11/21/2016   Procedure: POLYPECTOMY;  Surgeon: West Bali, MD;  [facility];  Service: Endoscopy;;  descending colon polyp   SHOULDER ARTHROSCOPY W/ ROTATOR CUFF REPAIR Right 2006   acromioclavicular joint arthrosis    Current Outpatient Medications  Medication Sig Dispense Refill   acetaminophen (TYLENOL) 325 MG tablet Take 650 mg by mouth every 6 (six) hours as needed for moderate pain.     albuterol (VENTOLIN HFA) 108 (90 Base) MCG/ACT inhaler INHALE 2 PUFFS BY MOUTH EVERY 6 HOURS AS NEEDED FOR COUGHING, WHEEZING, OR SHORTNESS OF BREATH 20.1 g 0   ALPRAZolam (XANAX) 0.5 MG tablet Take one twice a day and two at bedtime 120 tablet 2   amLODipine (NORVASC) 10 MG tablet Take 1 tablet by mouth once daily 90 tablet 3   budesonide-formoterol (SYMBICORT) 80-4.5 MCG/ACT inhaler Take 2 puffs first thing in am and then another 2 puffs about 12 hours later. 1 each 12   dronedarone (MULTAQ) 400 MG tablet Take 1 tablet (400 mg total) by mouth 2 (two) times daily with a meal. 180 tablet 3   EPINEPHrine 0.3 mg/0.3 mL IJ SOAJ injection Inject 0.3 mg into the muscle as needed for anaphylaxis. 2 each 1   esomeprazole (NEXIUM) 20 MG capsule Take 20 mg by mouth 2 (two) times daily. 2 caps am, 2 caps pm     famotidine (PEPCID) 20 MG tablet Take 20 mg by mouth as needed for heartburn or indigestion.     fluticasone (FLONASE) 50 MCG/ACT nasal spray Place 2 sprays into both nostrils daily. 16 g 5   metoprolol succinate (TOPROL XL) 25 MG 24 hr tablet Take 0.5 tablets (12.5 mg total) by mouth daily. 45 tablet 3   nitroGLYCERIN (NITROSTAT) 0.4 MG SL tablet Place 1 tablet (0.4 mg total) under the tongue every 5 (five) minutes x 3 doses as  needed for chest pain (if no relief after 3rd dose, proceed to the ED for an evaluation). 25 tablet 3   potassium chloride SA (KLOR-CON M) 20 MEQ tablet Take 1 tablet (20 mEq total) by mouth daily. 30 tablet 6   rivaroxaban (XARELTO) 20 MG TABS tablet TAKE 1 TABLET BY MOUTH ONCE DAILY WITH  SUPPER 90 tablet 1   simvastatin (ZOCOR) 20 MG tablet Take 1 tablet by mouth once daily 90 tablet 3   sucralfate (CARAFATE) 1 GM/10ML suspension Take 10 mLs (1 g total) by mouth 4 (four) times daily -  with meals and at bedtime for 14 days. 560 mL 0   vitamin B-12 (CYANOCOBALAMIN) 50 MCG tablet Take 50 mcg by mouth daily.     VITAMIN D PO Take 1 tablet by mouth daily.     No current facility-administered medications for this visit.  Allergies as of 01/25/2023 - Review Complete 01/25/2023  Allergen Reaction Noted   Alpha-gal Anaphylaxis 12/14/2022   Dexilant [dexlansoprazole] Anaphylaxis 01/20/2015   Mushroom ext cmplx(shiitake-reishi-mait) Anaphylaxis 03/22/2011   Penicillins Anaphylaxis    Doxycycline Nausea And Vomiting 04/18/2016    Family History  Problem Relation Age of Onset   Lung cancer Mother    Alcohol abuse Mother    Heart attack Father 85   Diabetes Father    Alcohol abuse Father    Asthma Sister    Anxiety disorder Sister    Depression Sister    Anxiety disorder Sister    Hypertension Brother    Hypertension Brother    Heart attack Brother 4   Diabetes Brother    Hypertension Brother    Seizures Brother    Dementia Paternal Uncle    ADD / ADHD Daughter    Dementia Cousin    Colon cancer Neg Hx    Drug abuse Neg Hx    Bipolar disorder Neg Hx    OCD Neg Hx    Paranoid behavior Neg Hx    Schizophrenia Neg Hx    Sexual abuse Neg Hx    Physical abuse Neg Hx     Social History   Socioeconomic History   Marital status: Married    Spouse name: Not on file   Number of children: Not on file   Years of education: Not on file   Highest education level: Not on file   Occupational History   Occupation: full time    Employer: UNEMPLOYED  Tobacco Use   Smoking status: Every Day    Current packs/day: 1.00    Average packs/day: 1 pack/day for 40.5 years (40.5 ttl pk-yrs)    Types: Cigarettes    Start date: 07/17/1982   Smokeless tobacco: Never   Tobacco comments:    1/2 pack a day  Vaping Use   Vaping status: Never Used  Substance and Sexual Activity   Alcohol use: Not Currently   Drug use: No   Sexual activity: Yes    Birth control/protection: None  Other Topics Concern   Not on file  Social History Narrative   Pt lives in [place] with wife.  5 children.  Unemployed due to panic attacks and back pain   Social Determinants of Health   Financial Resource Strain: Not on file  Food Insecurity: Not on file  Transportation Needs: Not on file  Physical Activity: Not on file  Stress: Not on file  Social Connections: Not on file  Intimate Partner Violence: Not on file     Review of Systems   Gen: Denies any fever, chills, fatigue, weight loss, lack of appetite.  CV: Denies chest pain, heart palpitations, peripheral edema, syncope.  Resp: Denies shortness of breath at rest or with exertion. Denies wheezing or cough.  GI: Denies dysphagia or odynophagia. Denies jaundice, hematemesis, fecal incontinence. GU : Denies urinary burning, urinary frequency, urinary hesitancy MS: Denies joint pain, muscle weakness, cramps, or limitation of movement.  Derm: Denies rash, itching, dry skin Psych: Denies depression, anxiety, memory loss, and confusion Heme: Denies bruising, bleeding, and enlarged lymph nodes.   Physical Exam   BP 128/71 (BP Location: Right Arm, Patient Position: Sitting, Cuff Size: Normal)   Pulse 60   Temp 98.1 F (36.7 C) (Oral)   Ht 6' (1.829 m)   Wt 171 lb 12.8 oz (77.9 kg)   SpO2 96%   BMI 23.30 kg/m  General:   Alert  and oriented. Pleasant and cooperative. Well-nourished and well-developed.  Head:  Normocephalic and  atraumatic. Eyes:  Without icterus Abdomen:  +BS, soft, non-tender and non-distended. No HSM noted. No guarding or rebound. No masses appreciated.  Rectal:  Deferred  Msk:  Symmetrical without gross deformities. Normal posture. Extremities:  Without edema. Neurologic:  Alert and  oriented x4;  grossly normal neurologically. Skin:  Intact without significant lesions or rashes. Psych:  Alert and cooperative. Normal mood and affect.   Assessment   Richard [name] is a 57 y.o. male presenting today with a history of chronic GERD, abdominal pain, dysphagia, constipation, prior weight loss now stablized.. 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 CTA unremarkable in the past. HIDA with borderline EF of 33%.   In interim from last visit, he underwent EGD due to dysphagia with findings of abnormal esophageal dysmotility. He is declining manometry, stating he will be unable to hold Xanax as requested. He and his wife also find it difficult to travel in [place] due to anxiety.  On further discussion, he does not seem to have solid food dysphagia but rather possibly atypical GERD symptoms, noting a "swelling" behind sternum 20-30 minutes after eating that is relieved with antacids. Will trial Baclofen low-dose BID prior to meals.         PLAN    Continue Nexium BID Add baclofen 10 mg BID before breakfast and dinner Cancel manometry Follow-up 3 months   Gelene Mink, PhD, [facility]

## 2023-01-31 ENCOUNTER — Telehealth: Payer: Self-pay | Admitting: Gastroenterology

## 2023-01-31 NOTE — Telephone Encounter (Signed)
Canceled the esophageal manometry per patient request. See the office note from primary GI.

## 2023-01-31 NOTE — Telephone Encounter (Signed)
PT is calling to cancel

## 2023-01-31 NOTE — Telephone Encounter (Signed)
PT is calling to cancel EM on 10/23 at [facility]. Please advise.

## 2023-02-12 ENCOUNTER — Encounter: Payer: Self-pay | Admitting: Family Medicine

## 2023-02-12 ENCOUNTER — Ambulatory Visit (INDEPENDENT_AMBULATORY_CARE_PROVIDER_SITE_OTHER): Payer: Medicaid Other | Admitting: Clinical

## 2023-02-12 ENCOUNTER — Ambulatory Visit (INDEPENDENT_AMBULATORY_CARE_PROVIDER_SITE_OTHER): Payer: Medicaid Other | Admitting: Family Medicine

## 2023-02-12 VITALS — BP 113/73 | HR 61 | Ht 72.0 in | Wt 169.1 lb

## 2023-02-12 DIAGNOSIS — F4001 Agoraphobia with panic disorder: Secondary | ICD-10-CM

## 2023-02-12 DIAGNOSIS — F331 Major depressive disorder, recurrent, moderate: Secondary | ICD-10-CM | POA: Diagnosis not present

## 2023-02-12 DIAGNOSIS — E559 Vitamin D deficiency, unspecified: Secondary | ICD-10-CM | POA: Diagnosis not present

## 2023-02-12 DIAGNOSIS — E7849 Other hyperlipidemia: Secondary | ICD-10-CM | POA: Diagnosis not present

## 2023-02-12 DIAGNOSIS — E038 Other specified hypothyroidism: Secondary | ICD-10-CM | POA: Diagnosis not present

## 2023-02-12 DIAGNOSIS — E538 Deficiency of other specified B group vitamins: Secondary | ICD-10-CM

## 2023-02-12 DIAGNOSIS — Z23 Encounter for immunization: Secondary | ICD-10-CM | POA: Diagnosis not present

## 2023-02-12 DIAGNOSIS — M79602 Pain in left arm: Secondary | ICD-10-CM | POA: Diagnosis not present

## 2023-02-12 DIAGNOSIS — F419 Anxiety disorder, unspecified: Secondary | ICD-10-CM | POA: Diagnosis not present

## 2023-02-12 DIAGNOSIS — I482 Chronic atrial fibrillation, unspecified: Secondary | ICD-10-CM

## 2023-02-12 DIAGNOSIS — R7301 Impaired fasting glucose: Secondary | ICD-10-CM | POA: Diagnosis not present

## 2023-02-12 DIAGNOSIS — R5383 Other fatigue: Secondary | ICD-10-CM

## 2023-02-12 DIAGNOSIS — F4310 Post-traumatic stress disorder, unspecified: Secondary | ICD-10-CM | POA: Diagnosis not present

## 2023-02-12 NOTE — Progress Notes (Signed)
Established Patient Office Visit  Subjective:  Patient ID: Richard [name], male    DOB: 06/05/1966  Age: 57 y.o. MRN: [id number]  CC:  Chief Complaint  Patient presents with   Care Management    3 month f/u, pt reports pain on his right shoulder since 2 years ago on and off has worsened in the past month, concerns about bruising easily, has been losing weight, also reports anxiety level spiked up has been smoking a lot more than usual, would like to have testosterone levels and metabolism levels checked     HPI Richard [name] is a 57 y.o. male with past medical history of HLP, hTN and GERD and  presents for f/u of  chronic medical conditions. For the details of today's visit, please refer to the assessment and plan.         Past Medical History:  Diagnosis Date   Arthritis    Asthma    CAD (coronary artery disease)    a. Cardiac cath 07/2015 showed 65% distal Cx, 20% mid-distal LAD, 20% prox-distal RCA, EF 60%, EDP .   Colitis 1990   COPD (chronic obstructive pulmonary disease) (HCC)    Depression    Dysrhythmia    Essential hypertension    Fatty liver    Gastric ulcer 2003; 2012   2003: + esophagitis; negative H.pylori serology  2012: Dr. Darrick Penna, mild gastritis, Bravo PH probe placement, negative H.pylori   GERD (gastroesophageal reflux disease)    Hepatic steatosis    History of hiatal hernia    Hyperlipemia    Overweight    Panic attacks    Paroxysmal atrial fibrillation (HCC)    Pulmonary nodules    Stroke (HCC)    TIA (transient ischemic attack)    Type II diabetes mellitus (HCC)     Past Surgical History:  Procedure Laterality Date   BALLOON DILATION N/A 07/25/2021   Procedure: BALLOON DILATION;  Surgeon: Lanelle Bal, DO;  [facility];  Service: Endoscopy;  Laterality: N/A;   BIOPSY  07/25/2021   Procedure: BIOPSY;  Surgeon: Lanelle Bal, DO;  [facility];  Service: Endoscopy;;   BIOPSY  12/04/2022   Procedure: BIOPSY;   Surgeon: Lanelle Bal, DO;  [facility];  Service: Endoscopy;;   BRAVO PH STUDY  05/03/2011   AOZ:HYQM gastritis/normal esophagus and duodenum   CARDIAC CATHETERIZATION  1990s X 1; 2005; 08/12/2015   CARDIAC CATHETERIZATION N/A 08/12/2015   Procedure: Left Heart Cath and Coronary Angiography;  Surgeon: Lyn Records, MD; LAD 20%, CFX 65%, RCA 20%, EF 60%    COLONOSCOPY  1990   COLONOSCOPY WITH PROPOFOL N/A 11/21/2016   Dr. Darrick Penna: non-thrombosed external hemorrhoids, one 6 mm polyp (polypoid lesion), internal hemorrhoids. TI Normal. 10 years screening   ESOPHAGOGASTRODUODENOSCOPY  05/03/2011   VHQ:IONG gastritis   ESOPHAGOGASTRODUODENOSCOPY (EGD) WITH PROPOFOL N/A 07/25/2021   Procedure: ESOPHAGOGASTRODUODENOSCOPY (EGD) WITH PROPOFOL;  Surgeon: Lanelle Bal, DO;  [facility];  Service: Endoscopy;  Laterality: N/A;  1:30pm   ESOPHAGOGASTRODUODENOSCOPY (EGD) WITH PROPOFOL N/A 12/04/2022   Procedure: ESOPHAGOGASTRODUODENOSCOPY (EGD) WITH PROPOFOL;  Surgeon: Lanelle Bal, DO;  [facility];  Service: Endoscopy;  Laterality: N/A;  8:00AM;ASA 3   NECK MASS EXCISION Right    "done in dr's office; behind right ear/side of ncek"   POLYPECTOMY  11/21/2016   Procedure: POLYPECTOMY;  Surgeon: West Bali, MD;  [facility];  Service:  Endoscopy;;  descending colon polyp   SHOULDER ARTHROSCOPY W/ ROTATOR CUFF REPAIR Right 2006   acromioclavicular joint arthrosis    Family History  Problem Relation Age of Onset   Lung cancer Mother    Alcohol abuse Mother    Heart attack Father 63   Diabetes Father    Alcohol abuse Father    Asthma Sister    Anxiety disorder Sister    Depression Sister    Anxiety disorder Sister    Hypertension Brother    Hypertension Brother    Heart attack Brother 46   Diabetes Brother    Hypertension Brother    Seizures Brother    Dementia Paternal Uncle    ADD / ADHD Daughter    Dementia Cousin    Colon cancer  Neg Hx    Drug abuse Neg Hx    Bipolar disorder Neg Hx    OCD Neg Hx    Paranoid behavior Neg Hx    Schizophrenia Neg Hx    Sexual abuse Neg Hx    Physical abuse Neg Hx     Social History   Socioeconomic History   Marital status: Married    Spouse name: Not on file   Number of children: Not on file   Years of education: Not on file   Highest education level: Not on file  Occupational History   Occupation: full time    Employer: UNEMPLOYED  Tobacco Use   Smoking status: Every Day    Average packs/day: 1 pack/day for 40.0 years (39.2 ttl pk-yrs)    Types: Cigarettes    Start date: 07/17/1982   Smokeless tobacco: Never   Tobacco comments:    1/2 pack a day  Vaping Use   Vaping status: Never Used  Substance and Sexual Activity   Alcohol use: Not Currently   Drug use: No   Sexual activity: Yes    Birth control/protection: None  Other Topics Concern   Not on file  Social History Narrative   Pt lives in [place] with wife.  5 children.  Unemployed due to panic attacks and back pain   Social Determinants of Health   Financial Resource Strain: Not on file  Food Insecurity: Not on file  Transportation Needs: Not on file  Physical Activity: Not on file  Stress: Not on file  Social Connections: Not on file  Intimate Partner Violence: Not on file    Outpatient Medications Prior to Visit  Medication Sig Dispense Refill   acetaminophen (TYLENOL) 325 MG tablet Take 650 mg by mouth every 6 (six) hours as needed for moderate pain.     albuterol (VENTOLIN HFA) 108 (90 Base) MCG/ACT inhaler INHALE 2 PUFFS BY MOUTH EVERY 6 HOURS AS NEEDED FOR COUGHING, WHEEZING, OR SHORTNESS OF BREATH 20.1 g 0   amLODipine (NORVASC) 10 MG tablet Take 1 tablet by mouth once daily 90 tablet 3   baclofen (LIORESAL) 10 MG tablet Take 1 tablet (10 mg total) by mouth 2 (two) times daily. 30 minutes before breakfast and dinner 60 each 1   budesonide-formoterol (SYMBICORT) 80-4.5 MCG/ACT inhaler Take  2 puffs first thing in am and then another 2 puffs about 12 hours later. 1 each 12   dronedarone (MULTAQ) 400 MG tablet Take 1 tablet (400 mg total) by mouth 2 (two) times daily with a meal. 180 tablet 3   EPINEPHrine 0.3 mg/0.3 mL IJ SOAJ injection Inject 0.3 mg into the muscle as needed for anaphylaxis. 2 each 1  esomeprazole (NEXIUM) 20 MG capsule Take 20 mg by mouth 2 (two) times daily. 2 caps am, 2 caps pm     famotidine (PEPCID) 20 MG tablet Take 20 mg by mouth as needed for heartburn or indigestion.     fluticasone (FLONASE) 50 MCG/ACT nasal spray Place 2 sprays into both nostrils daily. 16 g 5   metoprolol succinate (TOPROL XL) 25 MG 24 hr tablet Take 0.5 tablets (12.5 mg total) by mouth daily. 45 tablet 3   nitroGLYCERIN (NITROSTAT) 0.4 MG SL tablet Place 1 tablet (0.4 mg total) under the tongue every 5 (five) minutes x 3 doses as needed for chest pain (if no relief after 3rd dose, proceed to the ED for an evaluation). 25 tablet 3   potassium chloride SA (KLOR-CON M) 20 MEQ tablet Take 1 tablet (20 mEq total) by mouth daily. 30 tablet 6   rivaroxaban (XARELTO) 20 MG TABS tablet TAKE 1 TABLET BY MOUTH ONCE DAILY WITH  SUPPER 90 tablet 1   simvastatin (ZOCOR) 20 MG tablet Take 1 tablet by mouth once daily 90 tablet 3   vitamin B-12 (CYANOCOBALAMIN) 50 MCG tablet Take 50 mcg by mouth daily.     VITAMIN D PO Take 1 tablet by mouth daily.     ALPRAZolam (XANAX) 0.5 MG tablet Take one twice a day and two at bedtime 120 tablet 2   sucralfate (CARAFATE) 1 GM/10ML suspension Take 10 mLs (1 g total) by mouth 4 (four) times daily -  with meals and at bedtime for 14 days. 560 mL 0   No facility-administered medications prior to visit.    Allergies  Allergen Reactions   Alpha-Gal Anaphylaxis   Dexilant [Dexlansoprazole] Anaphylaxis   Mushroom Ext Cmplx(Shiitake-Reishi-Mait) Anaphylaxis    Rapid heart rate.   Penicillins Anaphylaxis    Has patient had a PCN reaction causing immediate rash,  facial/tongue/throat swelling, SOB or lightheadedness with hypotension: Yes Has patient had a PCN reaction causing severe rash involving mucus membranes or skin necrosis: No Has patient had a PCN reaction that required hospitalization Yes Has patient had a PCN reaction occurring within the last 10 years: No If all of the above answers are "NO", then may proceed with Cephalosporin use.    Doxycycline Nausea And Vomiting         ROS Review of Systems  Constitutional:  Positive for fatigue. Negative for fever.  Eyes:  Negative for visual disturbance.  Respiratory:  Negative for chest tightness and shortness of breath.   Cardiovascular:  Negative for chest pain and palpitations.  Musculoskeletal:        Left shoulder pain  Neurological:  Negative for dizziness and headaches.  Psychiatric/Behavioral:  Positive for sleep disturbance.       Objective:    Physical Exam HENT:     Head: Normocephalic.     Right Ear: External ear normal.     Left Ear: External ear normal.     Nose: No congestion or rhinorrhea.     Mouth/Throat:     Mouth: Mucous membranes are moist.  Cardiovascular:     Rate and Rhythm: Regular rhythm.     Heart sounds: No murmur heard. Pulmonary:     Effort: No respiratory distress.     Breath sounds: Normal breath sounds.  Musculoskeletal:     Left shoulder: Crepitus present. No swelling or deformity. Normal range of motion.  Neurological:     Mental Status: He is alert.     BP 113/73  Pulse 61   Ht 6' (1.829 m)   Wt 169 lb 1.9 oz (76.7 kg)   SpO2 96%   BMI 22.94 kg/m  Wt Readings from Last 3 Encounters:  02/12/23 169 lb 1.9 oz (76.7 kg)  01/25/23 171 lb 6.4 oz (77.7 kg)  01/25/23 171 lb 12.8 oz (77.9 kg)    Lab Results  Component Value Date   TSH 2.300 02/13/2023   Lab Results  Component Value Date   WBC 6.8 02/13/2023   HGB 15.5 02/13/2023   HCT 45.6 02/13/2023   MCV 92 02/13/2023   PLT 192 02/13/2023   Lab Results  Component Value  Date   NA 143 02/13/2023   K 4.2 02/13/2023   CO2 25 02/13/2023   GLUCOSE 93 02/13/2023   BUN 16 02/13/2023   CREATININE 1.08 02/13/2023   BILITOT 0.4 02/13/2023   ALKPHOS 60 02/13/2023   AST 15 02/13/2023   ALT 23 02/13/2023   PROT 6.5 02/13/2023   ALBUMIN 4.6 02/13/2023   CALCIUM 9.4 02/13/2023   ANIONGAP 7 12/25/2022   EGFR 80 02/13/2023   Lab Results  Component Value Date   CHOL 107 02/13/2023   Lab Results  Component Value Date   HDL 39 (L) 02/13/2023   Lab Results  Component Value Date   LDLCALC 54 02/13/2023   Lab Results  Component Value Date   TRIG 67 02/13/2023   Lab Results  Component Value Date   CHOLHDL 2.7 02/13/2023   Lab Results  Component Value Date   HGBA1C 5.7 (H) 02/13/2023      Assessment & Plan:  Atrial fibrillation, chronic (HCC) Assessment & Plan: He is followed up with his cardiologist  He is on Xarelto 20 mg daily, Multaq 400 mg twice daily, simvastatin 20 mg daily, and metoprolol 12.5 mg daily Denies symptoms of shortness of breath, palpitation, chest pain, dizziness, and lightheadedness C/o increased bruising Educated pt that this is likely due to him taking xarelto Pt verbalize understanding    Fatigue, unspecified type Assessment & Plan: C/o increased fatigue Would like to assess his testosterone levels C/o trouble falling asleep Reviewed sleep hygiene, eating a nutritious meal and staying physically active Pt verbalized understanding The pt has loss 2lbs since 01/25/2023 Wt Readings from Last 3 Encounters:  02/12/23 169 lb 1.9 oz (76.7 kg)  01/25/23 171 lb 6.4 oz (77.7 kg)  01/25/23 171 lb 12.8 oz (77.9 kg)     Orders: -     Testosterone,Free and Total -     TSH + free T4 -     B12 and Folate Panel  B12 deficiency -     B12 and Folate Panel  Arm pain, left Assessment & Plan: No recent injury or trauma noted He is following up with orthopedics Encouraged supportive care with rest, tylenol, avoidance of  aggravating activities, heat/cold therapy Encourage to call and schedule an appointment with orthopedic if his symptoms fail to improve   Vitamin D deficiency -     VITAMIN D 25 Hydroxy (Vit-D Deficiency, Fractures)  IFG (impaired fasting glucose) -     Hemoglobin A1c  Other specified hypothyroidism -     TSH + free T4  Other hyperlipidemia -     Lipid panel -     CMP14+EGFR -     CBC with Differential/Platelet  Need for diphtheria-tetanus-pertussis (Tdap) vaccine  Immunization due -     Tdap vaccine greater than or equal to 7yo IM  Note: This chart has  been completed using Engineer, civil (consulting) software, and while attempts have been made to ensure accuracy, certain words and phrases may not be transcribed as intended.    Follow-up: Return in about 4 months (around 06/15/2023).   Gilmore Laroche, FNP

## 2023-02-12 NOTE — Progress Notes (Signed)
Virtual Visit via Video Note   I connected with Richard [name] on 02/12/23 at 4:00 PM EST by a video enabled telemedicine application and verified that I am speaking with the correct person using two identifiers.   Location: Patient: Home Provider: Office   I discussed the limitations of evaluation and management by telemedicine and the availability of in person appointments. The patient expressed understanding and agreed to proceed.   THERAPIST PROGRESS NOTE     Session Time: 4:00 PM-4:30 PM   Participation Level: Active   Behavioral Response: Casual and Alert,Anxious   Type of Therapy: Individual Therapy   Treatment Goals addressed: Mood and Anxiety   Interventions: CBT   Summary: Richard [name] is a 57 y.o. male who presents with panic disorder/depression with anxiety/and PTSD . The OPT therapist worked with the patient for his OPT treatment. The OPT therapist utilized Motivational Interviewing to assist in creating therapeutic repore. The patient in the session was engaged and work in collaboration giving feedback about his triggers and symptoms over the past few weeks The patient spoke about helping at home taking on task while his wife recovers from recent surgery procedure .The OPT therapist utilized Cognitive Behavioral Therapy through cognitive restructuring as well as worked with the patient on coping strategies to assist in management of symptoms as well as reviewed sleep, eating habits, and general health. The patient continues to work on acceptance of limited mobility based on health conditions.The patients spoke about upcoming doctors visits and bloodwork.The OPT therapist overviewed upcoming health appointments as listed in the patients MyChart.   Suicidal/Homicidal: Nowithout intent/plan   Therapist Response:The OPT therapist worked with the patient for the patients scheduled session. The patient was engaged in his session and gave feedback in relation to triggers,  symptoms, and behavior responses over the past few weeks. The OPT therapist worked with the patient utilizing an in session Cognitive Behavioral Therapy exercise. The patient was responsive in the session and verbalized, " I am working to be active and walk just being careful with the heat and I am so tired of eating grilled chicken I feel like I might grow feathers ( patient diet restricted to no red meat post a tick bite)". The patient spoke about planning his activities to avoid being out in excessive heat, but looking forward to the Fall season.The OPT therapist worked with the patient providing ongoing psychotherapy/education and coping skills review.The OPT therapist will continue treatment work with the patient in his next scheduled session   Plan: Return again in 2/3 weeks.   Diagnosis:      Axis I: PTSD/Panic Disorder/ Depression with anxiety  Axis II: No diagnosis      Collaboration of Care: No additional collaboration of care for this session.   Patient/Guardian was advised Release of Information must be obtained prior to any record release in order to collaborate their care with an outside provider. Patient/Guardian was advised if they have not already done so to contact the registration department to sign all necessary forms in order for [name] to release information regarding their care.    Consent: Patient/Guardian gives verbal consent for treatment and assignment of benefits for services provided during this visit. Patient/Guardian expressed understanding and agreed to proceed.    I discussed the assessment and treatment plan with the patient. The patient was provided an opportunity to ask questions and all were answered. The patient agreed with the plan and demonstrated an understanding of the instructions.   The patient  was advised to call back or seek an in-person evaluation if the symptoms worsen or if the condition fails to improve as anticipated.   I provided 30 minutes of  non-face-to-face time during this encounter.   [name], LCSW    02/12/2023

## 2023-02-12 NOTE — Patient Instructions (Addendum)
I appreciate the opportunity to provide care to you today!    Follow up:  4 months  Labs: please stop by the lab during the week to get your blood drawn (CBC, CMP, TSH, Lipid profile, HgA1c, Vit D)   Left shoulder -I recommend Rest, avoidance of aggravating activities, take baclofen as needed  and apply of heat/cold therapy Apply heat to the affected area such as a moist heat pack or a heating pad. Place a towel between your skin and the heat source. Leave the heat on for 20-30 minutes. Remove the heat if your skin turns bright red. This is especially important if you are unable to feel pain, heat, or cold. You may have a greater risk of getting burned. Apply  ice on the painful area. To do this: If you have a removable splint, remove it as told by your health care provider. Put ice in a plastic bag. Place a towel between your skin and the bag or between your splint and the bag. Leave the ice on for 20 minutes, 2-3 times a day.    -Please call and schedule an appointment with orthopedic if your symptoms worsens or fail to improve with supportive care  Smoking is harmful to your health and increases your risk for cancer, COPD, high blood pressure, cataracts, digestive problems, or health problems , such as gum disease, mouth sores, and tooth loss and loss of taste and smell. Smoking irritates your throat and causes coughing.   Nonpharmacologic management of anxiety and depression  Mindfulness and Meditation Practices like mindfulness meditation can help reduce symptoms by promoting relaxation and present-moment awareness.  Exercise  Regular physical activity has been shown to improve mood and reduce anxiety through the release of endorphins and other neurochemicals.  Healthy Diet Eating a balanced diet rich in fruits, vegetables, whole grains, and lean proteins can support overall mental health.  Sleep Hygiene  Establishing a regular sleep routine and ensuring good sleep quality can  significantly impact mood and anxiety levels.  Stress Management Techniques Activities such as yoga, tai chi, and deep breathing exercises can help manage stress.  Social Support Maintaining strong relationships and seeking support from friends, family, or support groups can provide emotional comfort and reduce feelings of isolation.  Lifestyle Modifications Reducing alcohol and caffeine intake, quitting smoking, and avoiding recreational drugs can improve symptoms.  Art and Music Therapy Engaging in creative activities like painting, drawing, or playing music can be therapeutic and help express emotions.  Light Therapy Particularly useful for seasonal affective disorder (SAD), exposure to bright light can help regulate mood. [name]   Nonpharmacological management for insomnia Sleep hygiene practices -Establish a bedtime routine -Have a sleep and wake schedule Avoid computer other electronic devices at least 1 hour before bedtime -Never lying in bed for more than 15 minutes if not able to sleep -Reduce daily stress -Perform activities to reduce stress levels before bedtime -Avoid late in the day exercise intensive -Use bed/bedroom for sleep sex only -consider removing electronic devices away from the sleeping area before bedtime     Attached with your AVS, you will find valuable resources for self-education. I highly recommend dedicating some time to thoroughly examine them.   Please continue to a heart-healthy diet and increase your physical activities. Try to exercise for at least five days a week.    It was a pleasure to see you and I look forward to continuing to work together on your health and well-being. Please do not  hesitate to call the office if you need care or have questions about your care.  In case of emergency, please visit the Emergency Department for urgent care, or contact our clinic at [phone] to schedule an appointment. We're here to help you!   Have a  wonderful day and week. With Gratitude, Gilmore Laroche MSN, FNP-BC

## 2023-02-13 DIAGNOSIS — E559 Vitamin D deficiency, unspecified: Secondary | ICD-10-CM | POA: Diagnosis not present

## 2023-02-13 DIAGNOSIS — E038 Other specified hypothyroidism: Secondary | ICD-10-CM | POA: Diagnosis not present

## 2023-02-13 DIAGNOSIS — R5383 Other fatigue: Secondary | ICD-10-CM | POA: Diagnosis not present

## 2023-02-13 DIAGNOSIS — Z23 Encounter for immunization: Secondary | ICD-10-CM | POA: Diagnosis not present

## 2023-02-13 DIAGNOSIS — I482 Chronic atrial fibrillation, unspecified: Secondary | ICD-10-CM | POA: Diagnosis not present

## 2023-02-13 DIAGNOSIS — R7301 Impaired fasting glucose: Secondary | ICD-10-CM | POA: Diagnosis not present

## 2023-02-13 DIAGNOSIS — M79602 Pain in left arm: Secondary | ICD-10-CM | POA: Diagnosis not present

## 2023-02-13 DIAGNOSIS — E7849 Other hyperlipidemia: Secondary | ICD-10-CM | POA: Diagnosis not present

## 2023-02-13 DIAGNOSIS — E538 Deficiency of other specified B group vitamins: Secondary | ICD-10-CM | POA: Diagnosis not present

## 2023-02-15 DIAGNOSIS — Z419 Encounter for procedure for purposes other than remedying health state, unspecified: Secondary | ICD-10-CM | POA: Diagnosis not present

## 2023-02-16 ENCOUNTER — Telehealth (INDEPENDENT_AMBULATORY_CARE_PROVIDER_SITE_OTHER): Payer: Medicaid Other | Admitting: Psychiatry

## 2023-02-16 ENCOUNTER — Encounter (HOSPITAL_COMMUNITY): Payer: Self-pay | Admitting: Psychiatry

## 2023-02-16 DIAGNOSIS — F331 Major depressive disorder, recurrent, moderate: Secondary | ICD-10-CM

## 2023-02-16 DIAGNOSIS — F419 Anxiety disorder, unspecified: Secondary | ICD-10-CM

## 2023-02-16 DIAGNOSIS — R5383 Other fatigue: Secondary | ICD-10-CM | POA: Insufficient documentation

## 2023-02-16 DIAGNOSIS — F4310 Post-traumatic stress disorder, unspecified: Secondary | ICD-10-CM | POA: Diagnosis not present

## 2023-02-16 MED ORDER — ALPRAZOLAM 0.5 MG PO TABS: ORAL_TABLET | ORAL | 2 refills | Status: DC

## 2023-02-16 NOTE — Assessment & Plan Note (Signed)
No recent injury or trauma noted He is following up with orthopedics Encouraged supportive care with rest, tylenol, avoidance of aggravating activities, heat/cold therapy Encourage to call and schedule an appointment with orthopedic if his symptoms fail to improve

## 2023-02-16 NOTE — Progress Notes (Signed)
Virtual Visit via Video Note  I connected with Richard [name] on 02/16/23 at 11:20 AM EDT by a video enabled telemedicine application and verified that I am speaking with the correct person using two identifiers.  Location: Patient: home Provider: office   I discussed the limitations of evaluation and management by telemedicine and the availability of in person appointments. The patient expressed understanding and agreed to proceed.      I discussed the assessment and treatment plan with the patient. The patient was provided an opportunity to ask questions and all were answered. The patient agreed with the plan and demonstrated an understanding of the instructions.   The patient was advised to call back or seek an in-person evaluation if the symptoms worsen or if the condition fails to improve as anticipated.  I provided 20 minutes of non-face-to-face time during this encounter.   Diannia Ruder, MD  [facility] MD/PA/NP OP Progress Note  02/16/2023 11:41 AM Richard [name]  MRN:  [id number]  Chief Complaint:  Chief Complaint  Patient presents with   Anxiety   Depression   Follow-up   HPI: This patient is a 57 year old white male lives with his wife in [place].  He is unemployed but is recently gotten on SSI disability  The patient returns for follow-up after 3 months regarding his anxiety.  For the most part he has been doing well.  He was in the emergency room again in May for chest pain but he did not have any cardiac problems.  He is followed closely by cardiology for atrial fibrillation.  He was found to have alpha gal syndrome and now cannot eat red meat but he is doing fine with that.  He is generally sleeping fairly well and denies severe depression or thoughts of self-harm or suicide.  He states that if he takes his Xanax as prescribed it really helps to alleviate his anxiety. Visit Diagnosis:    ICD-10-CM   1. PTSD (post-traumatic stress disorder)  F43.10     2. Recurrent  moderate major depressive disorder with anxiety (HCC)  F33.1    F41.9       Past Psychiatric History: none  Past Medical History:  Past Medical History:  Diagnosis Date   Arthritis    Asthma    CAD (coronary artery disease)    a. Cardiac cath 07/2015 showed 65% distal Cx, 20% mid-distal LAD, 20% prox-distal RCA, EF 60%, EDP .   Colitis 1990   COPD (chronic obstructive pulmonary disease) (HCC)    Depression    Dysrhythmia    Essential hypertension    Fatty liver    Gastric ulcer 2003; 2012   2003: + esophagitis; negative H.pylori serology  2012: Dr. Darrick Penna, mild gastritis, Bravo PH probe placement, negative H.pylori   GERD (gastroesophageal reflux disease)    Hepatic steatosis    History of hiatal hernia    Hyperlipemia    Overweight    Panic attacks    Paroxysmal atrial fibrillation (HCC)    Pulmonary nodules    Stroke (HCC)    TIA (transient ischemic attack)    Type II diabetes mellitus (HCC)     Past Surgical History:  Procedure Laterality Date   BALLOON DILATION N/A 07/25/2021   Procedure: BALLOON DILATION;  Surgeon: Lanelle Bal, DO;  [facility];  Service: Endoscopy;  Laterality: N/A;   BIOPSY  07/25/2021   Procedure: BIOPSY;  Surgeon: Lanelle Bal, DO;  [facility];  Service: Endoscopy;;  BIOPSY  12/04/2022   Procedure: BIOPSY;  Surgeon: Lanelle Bal, DO;  [facility];  Service: Endoscopy;;   BRAVO PH STUDY  05/03/2011   ZOX:WRUE gastritis/normal esophagus and duodenum   CARDIAC CATHETERIZATION  1990s X 1; 2005; 08/12/2015   CARDIAC CATHETERIZATION N/A 08/12/2015   Procedure: Left Heart Cath and Coronary Angiography;  Surgeon: Lyn Records, MD; LAD 20%, CFX 65%, RCA 20%, EF 60%    COLONOSCOPY  1990   COLONOSCOPY WITH PROPOFOL N/A 11/21/2016   Dr. Darrick Penna: non-thrombosed external hemorrhoids, one 6 mm polyp (polypoid lesion), internal hemorrhoids. TI Normal. 10 years screening   ESOPHAGOGASTRODUODENOSCOPY   05/03/2011   AVW:UJWJ gastritis   ESOPHAGOGASTRODUODENOSCOPY (EGD) WITH PROPOFOL N/A 07/25/2021   Procedure: ESOPHAGOGASTRODUODENOSCOPY (EGD) WITH PROPOFOL;  Surgeon: Lanelle Bal, DO;  [facility];  Service: Endoscopy;  Laterality: N/A;  1:30pm   ESOPHAGOGASTRODUODENOSCOPY (EGD) WITH PROPOFOL N/A 12/04/2022   Procedure: ESOPHAGOGASTRODUODENOSCOPY (EGD) WITH PROPOFOL;  Surgeon: Lanelle Bal, DO;  [facility];  Service: Endoscopy;  Laterality: N/A;  8:00AM;ASA 3   NECK MASS EXCISION Right    "done in dr's office; behind right ear/side of ncek"   POLYPECTOMY  11/21/2016   Procedure: POLYPECTOMY;  Surgeon: West Bali, MD;  [facility];  Service: Endoscopy;;  descending colon polyp   SHOULDER ARTHROSCOPY W/ ROTATOR CUFF REPAIR Right 2006   acromioclavicular joint arthrosis    Family Psychiatric History: See below  Family History:  Family History  Problem Relation Age of Onset   Lung cancer Mother    Alcohol abuse Mother    Heart attack Father 41   Diabetes Father    Alcohol abuse Father    Asthma Sister    Anxiety disorder Sister    Depression Sister    Anxiety disorder Sister    Hypertension Brother    Hypertension Brother    Heart attack Brother 81   Diabetes Brother    Hypertension Brother    Seizures Brother    Dementia Paternal Uncle    ADD / ADHD Daughter    Dementia Cousin    Colon cancer Neg Hx    Drug abuse Neg Hx    Bipolar disorder Neg Hx    OCD Neg Hx    Paranoid behavior Neg Hx    Schizophrenia Neg Hx    Sexual abuse Neg Hx    Physical abuse Neg Hx     Social History:  Social History   Socioeconomic History   Marital status: Married    Spouse name: Not on file   Number of children: Not on file   Years of education: Not on file   Highest education level: Not on file  Occupational History   Occupation: full time    Employer: UNEMPLOYED  Tobacco Use   Smoking status: Every Day    Average packs/day: 1  pack/day for 40.0 years (39.2 ttl pk-yrs)    Types: Cigarettes    Start date: 07/17/1982   Smokeless tobacco: Never   Tobacco comments:    1/2 pack a day  Vaping Use   Vaping status: Never Used  Substance and Sexual Activity   Alcohol use: Not Currently   Drug use: No   Sexual activity: Yes    Birth control/protection: None  Other Topics Concern   Not on file  Social History Narrative   Pt lives in [place] with wife.  5 children.  Unemployed due to panic attacks and  back pain   Social Determinants of Health   Financial Resource Strain: Not on file  Food Insecurity: Not on file  Transportation Needs: Not on file  Physical Activity: Not on file  Stress: Not on file  Social Connections: Not on file    Allergies:  Allergies  Allergen Reactions   Alpha-Gal Anaphylaxis   Dexilant [Dexlansoprazole] Anaphylaxis   Mushroom Ext Cmplx(Shiitake-Reishi-Mait) Anaphylaxis    Rapid heart rate.   Penicillins Anaphylaxis    Has patient had a PCN reaction causing immediate rash, facial/tongue/throat swelling, SOB or lightheadedness with hypotension: Yes Has patient had a PCN reaction causing severe rash involving mucus membranes or skin necrosis: No Has patient had a PCN reaction that required hospitalization Yes Has patient had a PCN reaction occurring within the last 10 years: No If all of the above answers are "NO", then may proceed with Cephalosporin use.    Doxycycline Nausea And Vomiting         Metabolic Disorder Labs: Lab Results  Component Value Date   HGBA1C 5.7 (H) 02/13/2023   MPG 111.15 02/16/2022   MPG 114.02 11/09/2021   No results found for: "PROLACTIN" Lab Results  Component Value Date   CHOL 107 02/13/2023   TRIG 67 02/13/2023   HDL 39 (L) 02/13/2023   CHOLHDL 2.7 02/13/2023   VLDL 8 06/13/2022   LDLCALC 54 02/13/2023   LDLCALC 58 10/20/2022   Lab Results  Component Value Date   TSH 2.300 02/13/2023   TSH 2.450 10/20/2022    Therapeutic Level  Labs: No results found for: "LITHIUM" No results found for: "VALPROATE" No results found for: "CBMZ"  Current Medications: Current Outpatient Medications  Medication Sig Dispense Refill   acetaminophen (TYLENOL) 325 MG tablet Take 650 mg by mouth every 6 (six) hours as needed for moderate pain.     albuterol (VENTOLIN HFA) 108 (90 Base) MCG/ACT inhaler INHALE 2 PUFFS BY MOUTH EVERY 6 HOURS AS NEEDED FOR COUGHING, WHEEZING, OR SHORTNESS OF BREATH 20.1 g 0   ALPRAZolam (XANAX) 0.5 MG tablet Take one twice a day and two at bedtime 120 tablet 2   amLODipine (NORVASC) 10 MG tablet Take 1 tablet by mouth once daily 90 tablet 3   baclofen (LIORESAL) 10 MG tablet Take 1 tablet (10 mg total) by mouth 2 (two) times daily. 30 minutes before breakfast and dinner 60 each 1   budesonide-formoterol (SYMBICORT) 80-4.5 MCG/ACT inhaler Take 2 puffs first thing in am and then another 2 puffs about 12 hours later. 1 each 12   dronedarone (MULTAQ) 400 MG tablet Take 1 tablet (400 mg total) by mouth 2 (two) times daily with a meal. 180 tablet 3   EPINEPHrine 0.3 mg/0.3 mL IJ SOAJ injection Inject 0.3 mg into the muscle as needed for anaphylaxis. 2 each 1   esomeprazole (NEXIUM) 20 MG capsule Take 20 mg by mouth 2 (two) times daily. 2 caps am, 2 caps pm     famotidine (PEPCID) 20 MG tablet Take 20 mg by mouth as needed for heartburn or indigestion.     fluticasone (FLONASE) 50 MCG/ACT nasal spray Place 2 sprays into both nostrils daily. 16 g 5   metoprolol succinate (TOPROL XL) 25 MG 24 hr tablet Take 0.5 tablets (12.5 mg total) by mouth daily. 45 tablet 3   nitroGLYCERIN (NITROSTAT) 0.4 MG SL tablet Place 1 tablet (0.4 mg total) under the tongue every 5 (five) minutes x 3 doses as needed for chest pain (if no relief after  3rd dose, proceed to the ED for an evaluation). 25 tablet 3   potassium chloride SA (KLOR-CON M) 20 MEQ tablet Take 1 tablet (20 mEq total) by mouth daily. 30 tablet 6   rivaroxaban (XARELTO) 20 MG  TABS tablet TAKE 1 TABLET BY MOUTH ONCE DAILY WITH  SUPPER 90 tablet 1   simvastatin (ZOCOR) 20 MG tablet Take 1 tablet by mouth once daily 90 tablet 3   sucralfate (CARAFATE) 1 GM/10ML suspension Take 10 mLs (1 g total) by mouth 4 (four) times daily -  with meals and at bedtime for 14 days. 560 mL 0   vitamin B-12 (CYANOCOBALAMIN) 50 MCG tablet Take 50 mcg by mouth daily.     VITAMIN D PO Take 1 tablet by mouth daily.     No current facility-administered medications for this visit.     Musculoskeletal: Strength & Muscle Tone: within normal limits Gait & Station: normal Patient leans: N/A  Psychiatric Specialty Exam: Review of Systems  Respiratory:  Positive for chest tightness.   All other systems reviewed and are negative.   There were no vitals taken for this visit.There is no height or weight on file to calculate BMI.  General Appearance: Casual and Fairly Groomed  Eye Contact:  Good  Speech:  Clear and Coherent  Volume:  Normal  Mood:  Euthymic  Affect:  Congruent  Thought Process:  Goal Directed  Orientation:  Full (Time, Place, and Person)  Thought Content: Rumination   Suicidal Thoughts:  No  Homicidal Thoughts:  No  Memory:  Immediate;   Good Recent;   Good Remote;   NA  Judgement:  Good  Insight:  Good  Psychomotor Activity:  Decreased  Concentration:  Concentration: Good and Attention Span: Good  Recall:  Good  Fund of Knowledge: Good  Language: Good  Akathisia:  No  Handed:  Right  AIMS (if indicated): not done  Assets:  Communication Skills Desire for Improvement Resilience Social Support Talents/Skills  ADL's:  Intact  Cognition: WNL  Sleep:  Good   Screenings: GAD-7    Flowsheet Row Office Visit from 02/12/2023 in [facility] Office Visit from 12/05/2022 in [facility] Office Visit from 11/10/2022 in [facility] Office Visit from 08/04/2022 in [facility]  Counselor from 08/24/2021 in [facility] at [place]  Total GAD-7 Score 9 7 13 8 20       PHQ2-9    Flowsheet Row Office Visit from 02/12/2023 in [facility] Office Visit from 12/05/2022 in [facility] Office Visit from 11/10/2022 in [facility] Office Visit from 08/04/2022 in [facility] Video Visit from 11/29/2021 in [facility] at [facility] Total Score 2 2 3 4 1   PHQ-9 Total Score 6 5 10 12  --      Flowsheet Row ED from 12/08/2022 in [facility] Emergency Department at [facility] Admission (Discharged) from 12/04/2022 in [facility] ED from 10/23/2022 in [facility] Emergency Department at [facility]  C-SSRS RISK CATEGORY No Risk No Risk No Risk        Assessment and Plan: This patient is a 57 year old male with a history of anxiety and panic attacks.  He really does not like taking antidepressants as he has some any side effects.  Since his mood is fairly good right now he would  just like to continue with the Xanax 0.5 mg twice daily and 1 mg at bedtime for anxiety.  He will return to see me in 3 months  Collaboration of Care: Collaboration of Care: Primary Care Provider AEB notes are shared with PCP on the epic system  Patient/Guardian was advised Release of Information must be obtained prior to any record release in order to collaborate their care with an outside provider. Patient/Guardian was advised if they have not already done so to contact the registration department to sign all necessary forms in order for [name] to release information regarding their care.   Consent: Patient/Guardian gives verbal consent for treatment and assignment of benefits for services provided during this visit. Patient/Guardian expressed understanding and agreed to proceed.    Diannia Ruder, MD 02/16/2023, 11:41 AM

## 2023-02-16 NOTE — Assessment & Plan Note (Addendum)
C/o increased fatigue Would like to assess his testosterone levels C/o trouble falling asleep Reviewed sleep hygiene, eating a nutritious meal and staying physically active Pt verbalized understanding The pt has loss 2lbs since 01/25/2023 Wt Readings from Last 3 Encounters:  02/12/23 169 lb 1.9 oz (76.7 kg)  01/25/23 171 lb 6.4 oz (77.7 kg)  01/25/23 171 lb 12.8 oz (77.9 kg)

## 2023-02-16 NOTE — Assessment & Plan Note (Signed)
He is followed up with his cardiologist  He is on Xarelto 20 mg daily, Multaq 400 mg twice daily, simvastatin 20 mg daily, and metoprolol 12.5 mg daily Denies symptoms of shortness of breath, palpitation, chest pain, dizziness, and lightheadedness C/o increased bruising Educated pt that this is likely due to him taking xarelto Pt verbalize understanding

## 2023-03-11 ENCOUNTER — Other Ambulatory Visit: Payer: Self-pay | Admitting: Cardiology

## 2023-03-12 NOTE — Telephone Encounter (Signed)
Prescription refill request for Xarelto received.  Indication: PAF Last office visit: 01/25/23  B Strader PA-C Weight: 77.7kg Age: 57 Scr: 1.08 on 02/13/23 CrCl: 81.94  Based on above findings Xarelto 20mg  daily is the appropriate dose.  Refill approved.

## 2023-03-17 ENCOUNTER — Telehealth: Payer: Self-pay

## 2023-03-17 ENCOUNTER — Ambulatory Visit
Admission: RE | Admit: 2023-03-17 | Discharge: 2023-03-17 | Disposition: A | Discharge status: Home / Self Care | Payer: Medicaid Other | Source: Ambulatory Visit | Attending: Nurse Practitioner | Admitting: Nurse Practitioner

## 2023-03-17 VITALS — BP 121/73 | HR 60 | Temp 98.0°F | Resp 16

## 2023-03-17 DIAGNOSIS — K068 Other specified disorders of gingiva and edentulous alveolar ridge: Secondary | ICD-10-CM | POA: Diagnosis not present

## 2023-03-17 MED ORDER — CLINDAMYCIN PALMITATE HCL 75 MG/5ML PO SOLR: 300.0000 mg | Freq: Two times a day (BID) | ORAL | 0 refills | Status: DC

## 2023-03-17 MED ORDER — CHLORHEXIDINE GLUCONATE 0.12 % MT SOLN: OROMUCOSAL | 0 refills | Status: DC

## 2023-03-17 MED ORDER — CLINDAMYCIN PALMITATE HCL 75 MG/5ML PO SOLR: 300.0000 mg | Freq: Two times a day (BID) | ORAL | 0 refills | Status: AC

## 2023-03-17 NOTE — ED Triage Notes (Signed)
Pt reports he has some right side gum pain and roof of mouth pain and a bump comes up while trying to eat on the gums  x 2 weeks. States sinus are messed up  Cannot eat properly due to gum pain (has no teeth)

## 2023-03-17 NOTE — ED Provider Notes (Signed)
[facility]    CSN: [id number] Arrival date & time: 03/17/23  1204      History   Chief Complaint Chief Complaint  Patient presents with   Dental Problem    Problem between roof of mouth and gum hurts to eat - Entered by patient    HPI Richard [name] is a 57 y.o. male.   The history is provided by the patient.   Patient presents for complaints of gum swelling that comes and goes.  Patient states when he eats, he develops a "bump" on the right side of his upper gums.  Patient reports that he had all of his teeth pulled approximately 1 year ago, and has not had any problems with eating until recently.  Patient states that the pain in his mouth causes him not to want to eat.  He states he has noticed that he has lost a few pounds since his symptoms started.  Patient denies fever, chills, gum swelling, facial swelling, jaw pain, chest pain, nausea, vomiting, or diarrhea.  Patient reports that he has had x-rays done by his dentist, thinking there was tooth fragments remaining, but states that the dentist told him there was nothing there.    Past Medical History:  Diagnosis Date   Arthritis    Asthma    CAD (coronary artery disease)    a. Cardiac cath 07/2015 showed 65% distal Cx, 20% mid-distal LAD, 20% prox-distal RCA, EF 60%, EDP .   Colitis 1990   COPD (chronic obstructive pulmonary disease) (HCC)    Depression    Dysrhythmia    Essential hypertension    Fatty liver    Gastric ulcer 2003; 2012   2003: + esophagitis; negative H.pylori serology  2012: Dr. Darrick Penna, mild gastritis, Bravo PH probe placement, negative H.pylori   GERD (gastroesophageal reflux disease)    Hepatic steatosis    History of hiatal hernia    Hyperlipemia    Overweight    Panic attacks    Paroxysmal atrial fibrillation (HCC)    Pulmonary nodules    Stroke [facility])    TIA (transient ischemic attack)    Type II diabetes mellitus (HCC)     Patient Active Problem List   Diagnosis Date  Noted   Fatigue 02/16/2023   Delayed wound healing 12/05/2022   Prostate cancer screening 11/10/2022   Arm pain, left 08/04/2022   Upper airway cough syndrome vs cough variant asthma 03/14/2022   Abnormal chest CT 11/16/2021   Pulmonary nodules 11/16/2021   RLQ abdominal pain 08/14/2021   Abnormal weight loss 08/14/2021   Acute ischemic stroke (HCC) 08/03/2021   Hypokalemia 08/03/2021   Hyperglycemia due to diabetes mellitus (HCC) 08/03/2021   Atrial fibrillation, chronic (HCC) 08/03/2021   Secondary hypercoagulable state (HCC) 04/27/2021   Paroxysmal atrial fibrillation (HCC)    Lateral epicondylitis, left elbow 03/27/2019   Globus sensation 02/21/2018   Other cervical disc degeneration, unspecified cervical region 01/11/2018   Tendinitis of right triceps 01/11/2018   Neck pain 12/27/2017   Lateral epicondylitis, right elbow 05/10/2017   Constipation 12/24/2015   DOE (dyspnea on exertion) 11/17/2015   Coronary artery disease due to lipid rich plaque    Heart palpitations 08/12/2015   TIA (transient ischemic attack) 08/12/2015   Colon cancer screening 08/02/2015   Abdominal pain 12/18/2014   Encounter for screening colonoscopy 12/18/2014   Unspecified vitamin D deficiency 08/20/2012   Arteriosclerotic cardiovascular disease (ASCVD) 04/11/2012   Dyspepsia 11/29/2011   Chronic low back pain  Dysphagia 05/09/2011   Essential hypertension    Anxiety and depression    Controlled diabetes mellitus type 2 with complications (HCC) 10/03/2010   Cigarette smoker 09/14/2010   COPD (chronic obstructive pulmonary disease) (HCC) 09/14/2010   Mixed hyperlipidemia 11/25/2009   GERD (gastroesophageal reflux disease) 04/05/2009   Hepatic steatosis 04/05/2009    Past Surgical History:  Procedure Laterality Date   BALLOON DILATION N/A 07/25/2021   Procedure: BALLOON DILATION;  Surgeon: Lanelle Bal, DO;  [facility];  Service: Endoscopy;  Laterality: N/A;   BIOPSY   07/25/2021   Procedure: BIOPSY;  Surgeon: Lanelle Bal, DO;  [facility];  Service: Endoscopy;;   BIOPSY  12/04/2022   Procedure: BIOPSY;  Surgeon: Lanelle Bal, DO;  [facility];  Service: Endoscopy;;   BRAVO PH STUDY  05/03/2011   ION:GEXB gastritis/normal esophagus and duodenum   CARDIAC CATHETERIZATION  1990s X 1; 2005; 08/12/2015   CARDIAC CATHETERIZATION N/A 08/12/2015   Procedure: Left Heart Cath and Coronary Angiography;  Surgeon: Lyn Records, MD; LAD 20%, CFX 65%, RCA 20%, EF 60%    COLONOSCOPY  1990   COLONOSCOPY WITH PROPOFOL N/A 11/21/2016   Dr. Darrick Penna: non-thrombosed external hemorrhoids, one 6 mm polyp (polypoid lesion), internal hemorrhoids. TI Normal. 10 years screening   ESOPHAGOGASTRODUODENOSCOPY  05/03/2011   MWU:XLKG gastritis   ESOPHAGOGASTRODUODENOSCOPY (EGD) WITH PROPOFOL N/A 07/25/2021   Procedure: ESOPHAGOGASTRODUODENOSCOPY (EGD) WITH PROPOFOL;  Surgeon: Lanelle Bal, DO;  [facility];  Service: Endoscopy;  Laterality: N/A;  1:30pm   ESOPHAGOGASTRODUODENOSCOPY (EGD) WITH PROPOFOL N/A 12/04/2022   Procedure: ESOPHAGOGASTRODUODENOSCOPY (EGD) WITH PROPOFOL;  Surgeon: Lanelle Bal, DO;  [facility];  Service: Endoscopy;  Laterality: N/A;  8:00AM;ASA 3   NECK MASS EXCISION Right    "done in dr's office; behind right ear/side of ncek"   POLYPECTOMY  11/21/2016   Procedure: POLYPECTOMY;  Surgeon: West Bali, MD;  [facility];  Service: Endoscopy;;  descending colon polyp   SHOULDER ARTHROSCOPY W/ ROTATOR CUFF REPAIR Right 2006   acromioclavicular joint arthrosis       Home Medications    Prior to Admission medications   Medication Sig Start Date End Date Taking? Authorizing Provider  chlorhexidine (PERIDEX) 0.12 % solution Gargle and spit 15 mL twice daily for the next 10 days. 03/17/23  Yes Gunther Zawadzki-Warren, Sadie Haber, NP  clindamycin (CLEOCIN) 75 MG/5ML solution Take 20 mLs (300 mg total) by  mouth 2 (two) times daily for 5 days. 03/17/23 03/22/23 Yes Travonne Schowalter-Warren, Sadie Haber, NP  acetaminophen (TYLENOL) 325 MG tablet Take 650 mg by mouth every 6 (six) hours as needed for moderate pain.    [provider]  albuterol (VENTOLIN HFA) 108 (90 Base) MCG/ACT inhaler INHALE 2 PUFFS BY MOUTH EVERY 6 HOURS AS NEEDED FOR COUGHING, WHEEZING, OR SHORTNESS OF BREATH 06/19/22   Jacquelin Hawking, PA-C  ALPRAZolam Prudy Feeler) 0.5 MG tablet Take one twice a day and two at bedtime 02/16/23   Myrlene Broker, MD  amLODipine (NORVASC) 10 MG tablet Take 1 tablet by mouth once daily 12/07/22   Jonelle Sidle, MD  baclofen (LIORESAL) 10 MG tablet Take 1 tablet (10 mg total) by mouth 2 (two) times daily. 30 minutes before breakfast and dinner 01/25/23   Gelene Mink, NP  budesonide-formoterol [facility]) 80-4.5 MCG/ACT inhaler Take 2 puffs first thing in am and then another 2 puffs about 12 hours later. 01/02/23  Nyoka Cowden, MD  dronedarone (MULTAQ) 400 MG tablet Take 1 tablet (400 mg total) by mouth 2 (two) times daily with a meal. 07/26/22 07/27/23  Marinus Maw, MD  EPINEPHrine 0.3 mg/0.3 mL IJ SOAJ injection Inject 0.3 mg into the muscle as needed for anaphylaxis. 12/18/22   Birder Robson, MD  esomeprazole (NEXIUM) 20 MG capsule Take 20 mg by mouth 2 (two) times daily. 2 caps am, 2 caps pm    [provider]  famotidine (PEPCID) 20 MG tablet Take 20 mg by mouth as needed for heartburn or indigestion.    [provider]  fluticasone (FLONASE) 50 MCG/ACT nasal spray Place 2 sprays into both nostrils daily. 12/18/22   Birder Robson, MD  metoprolol succinate (TOPROL XL) 25 MG 24 hr tablet Take 0.5 tablets (12.5 mg total) by mouth daily. 07/28/22   Jonelle Sidle, MD  nitroGLYCERIN (NITROSTAT) 0.4 MG SL tablet Place 1 tablet (0.4 mg total) under the tongue every 5 (five) minutes x 3 doses as needed for chest pain (if no relief after 3rd dose, proceed to the ED for an evaluation). 07/26/22    Marinus Maw, MD  potassium chloride SA (KLOR-CON M) 20 MEQ tablet Take 1 tablet (20 mEq total) by mouth daily. 12/15/22   Dunn, Tacey Ruiz, PA-C  rivaroxaban (XARELTO) 20 MG TABS tablet TAKE 1 TABLET BY MOUTH ONCE DAILY WITH SUPPER 03/12/23   Jonelle Sidle, MD  simvastatin (ZOCOR) 20 MG tablet Take 1 tablet by mouth once daily 07/14/22   Marinus Maw, MD  sucralfate (CARAFATE) 1 GM/10ML suspension Take 10 mLs (1 g total) by mouth 4 (four) times daily -  with meals and at bedtime for 14 days. 12/25/22 01/25/23  Gelene Mink, NP  vitamin B-12 (CYANOCOBALAMIN) 50 MCG tablet Take 50 mcg by mouth daily.    [provider]  VITAMIN D PO Take 1 tablet by mouth daily.    [provider]    Family History Family History  Problem Relation Age of Onset   Lung cancer Mother    Alcohol abuse Mother    Heart attack Father 75   Diabetes Father    Alcohol abuse Father    Asthma Sister    Anxiety disorder Sister    Depression Sister    Anxiety disorder Sister    Hypertension Brother    Hypertension Brother    Heart attack Brother 79   Diabetes Brother    Hypertension Brother    Seizures Brother    Dementia Paternal Uncle    ADD / ADHD Daughter    Dementia Cousin    Colon cancer Neg Hx    Drug abuse Neg Hx    Bipolar disorder Neg Hx    OCD Neg Hx    Paranoid behavior Neg Hx    Schizophrenia Neg Hx    Sexual abuse Neg Hx    Physical abuse Neg Hx     Social History Social History   Tobacco Use   Smoking status: Every Day    Average packs/day: 1 pack/day for 40.0 years (39.2 ttl pk-yrs)    Types: Cigarettes    Start date: 07/17/1982   Smokeless tobacco: Never   Tobacco comments:    1/2 pack a day  Vaping Use   Vaping status: Never Used  Substance Use Topics   Alcohol use: Not Currently   Drug use: No     Allergies   Alpha-gal, Dexilant [dexlansoprazole], Mushroom  ext cmplx(shiitake-reishi-mait), Penicillins, and Doxycycline   Review of Systems Review  of Systems Per HPI  Physical Exam Triage Vital Signs ED Triage Vitals  Encounter Vitals Group     BP 03/17/23 1221 121/73     Systolic BP Percentile --      Diastolic BP Percentile --      Pulse Rate 03/17/23 1221 60     Resp 03/17/23 1221 16     Temp 03/17/23 1221 98 F (36.7 C)     Temp Source 03/17/23 1221 Oral     SpO2 03/17/23 1221 97 %     Weight --      Height --      Head Circumference --      Peak Flow --      Pain Score 03/17/23 1223 1     Pain Loc --      Pain Education --      Exclude from Growth Chart --    No data found.  Updated Vital Signs BP 121/73 (BP Location: Right Arm)   Pulse 60   Temp 98 F (36.7 C) (Oral)   Resp 16   SpO2 97%   Visual Acuity Right Eye Distance:   Left Eye Distance:   Bilateral Distance:    Right Eye Near:   Left Eye Near:    Bilateral Near:     Physical Exam Vitals and nursing note reviewed.  Constitutional:      General: He is not in acute distress.    Appearance: Normal appearance.  HENT:     Head: Normocephalic.     Right Ear: Tympanic membrane, ear canal and external ear normal.     Left Ear: Tympanic membrane, ear canal and external ear normal.     Nose: Congestion present.     Mouth/Throat:     Lips: Pink.     Mouth: Mucous membranes are moist. No oral lesions.     Dentition: Gum lesions present.     Pharynx: Oropharynx is clear. Uvula midline.     Comments: Small lesion noted to the front right upper gum.  Tenderness noted to the rear upper gums.  No obvious swelling or erythema present. Eyes:     Extraocular Movements: Extraocular movements intact.     Conjunctiva/sclera: Conjunctivae normal.     Pupils: Pupils are equal, round, and reactive to light.  Cardiovascular:     Rate and Rhythm: Normal rate and regular rhythm.     Pulses: Normal pulses.     Heart sounds: Normal heart sounds.  Pulmonary:     Effort: Pulmonary effort is normal. No respiratory distress.     Breath sounds: Normal breath  sounds. No stridor. No wheezing, rhonchi or rales.  Abdominal:     General: Bowel sounds are normal.     Palpations: Abdomen is soft.     Tenderness: There is no abdominal tenderness.  Musculoskeletal:     Cervical back: Normal range of motion.  Lymphadenopathy:     Cervical: No cervical adenopathy.  Skin:    General: Skin is warm and dry.  Neurological:     General: No focal deficit present.     Mental Status: He is alert and oriented to person, place, and time.  Psychiatric:        Mood and Affect: Mood normal.        Behavior: Behavior normal.      UC Treatments / Results  Labs (all labs ordered are listed, but only abnormal  results are displayed) Labs Reviewed - No data to display  EKG   Radiology No results found.  Procedures Procedures (including critical care time)  Medications Ordered in UC Medications - No data to display  Initial Impression / Assessment and Plan / UC Course  I have reviewed the triage vital signs and the nursing notes.  Pertinent labs & imaging results that were available during my care of the patient were reviewed by me and considered in my medical decision making (see chart for details).  The patient is well-appearing, he is in no acute distress, vital signs are stable.  Patient presents for complaints of pain to his gums with intermittent swelling after eating.  No obvious induration noted to the gums at this time as patient currently is not eating.  Difficult to determine the cause of his current symptoms.  Will treat empirically with clindamycin 300 mg twice daily for the next 5 days along with.  Peridex 12% solution for patient to gargle and spit.  Supportive care recommendations were provided and discussed with the patient to include over-the-counter analgesics, warm salt water rinses, and a warm diet.  Patient was advised to follow-up with periodontist if symptoms are not improving with this treatment.  Patient is in agreement with this  plan of care and verbalizes understanding.  All questions were answered.  Patient stable for discharge.   Final Clinical Impressions(s) / UC Diagnoses   Final diagnoses:  Pain in gums  Gum lesion     Discharge Instructions      Take medication as prescribed. May take over-the-counter Tylenol or ibuprofen as needed for pain or discomfort. Recommend warm salt water rinses as needed for gum pain or discomfort. Recommend a soft diet to include soft vegetables, and meat. As discussed, if symptoms are not improving with this treatment, it is recommended that you follow-up with a periodontist as soon as possible.  You can check with your dentist to see if they specialize in gum disease.     ED Prescriptions     Medication Sig Dispense Auth. Provider   clindamycin (CLEOCIN) 75 MG/5ML solution Take 20 mLs (300 mg total) by mouth 2 (two) times daily for 5 days. 200 mL Myldred Raju-Warren, Sadie Haber, NP   chlorhexidine (PERIDEX) 0.12 % solution Gargle and spit 15 mL twice daily for the next 10 days. 473 mL Rayvion Stumph-Warren, Sadie Haber, NP      PDMP not reviewed this encounter.   Abran Cantor, NP 03/17/23 1258

## 2023-03-17 NOTE — Discharge Instructions (Signed)
Take medication as prescribed. May take over-the-counter Tylenol or ibuprofen as needed for pain or discomfort. Recommend warm salt water rinses as needed for gum pain or discomfort. Recommend a soft diet to include soft vegetables, and meat. As discussed, if symptoms are not improving with this treatment, it is recommended that you follow-up with a periodontist as soon as possible.  You can check with your dentist to see if they specialize in gum disease.

## 2023-03-18 DIAGNOSIS — Z419 Encounter for procedure for purposes other than remedying health state, unspecified: Secondary | ICD-10-CM | POA: Diagnosis not present

## 2023-03-20 ENCOUNTER — Ambulatory Visit (INDEPENDENT_AMBULATORY_CARE_PROVIDER_SITE_OTHER): Payer: Medicaid Other | Admitting: Clinical

## 2023-03-20 DIAGNOSIS — F410 Panic disorder [episodic paroxysmal anxiety] without agoraphobia: Secondary | ICD-10-CM

## 2023-03-20 DIAGNOSIS — F419 Anxiety disorder, unspecified: Secondary | ICD-10-CM

## 2023-03-20 DIAGNOSIS — F4310 Post-traumatic stress disorder, unspecified: Secondary | ICD-10-CM | POA: Diagnosis not present

## 2023-03-20 DIAGNOSIS — F32A Depression, unspecified: Secondary | ICD-10-CM

## 2023-03-20 DIAGNOSIS — F331 Major depressive disorder, recurrent, moderate: Secondary | ICD-10-CM

## 2023-03-20 DIAGNOSIS — F4001 Agoraphobia with panic disorder: Secondary | ICD-10-CM

## 2023-03-20 NOTE — Progress Notes (Signed)
Virtual Visit via Telephone Note  I connected with Richard [name] on 03/20/23 at  3:00 PM EDT by telephone and verified that I am speaking with the correct person using two identifiers.  Location: Patient: home Provider: office   I discussed the limitations, risks, security and privacy concerns of performing an evaluation and management service by telephone and the availability of in person appointments. I also discussed with the patient that there may be a patient responsible charge related to this service. The patient expressed understanding and agreed to proceed.     THERAPIST PROGRESS NOTE     Session Time: 3:00 PM-3:55 PM   Participation Level: Active   Behavioral Response: Casual and Alert,Anxious   Type of Therapy: Individual Therapy   Treatment Goals addressed: Mood and Anxiety   Interventions: CBT   Summary: Richard [name] is a 57 y.o. male who presents with panic disorder/depression with anxiety/and PTSD . The OPT therapist worked with the patient for his OPT treatment. The OPT therapist utilized Motivational Interviewing to assist in creating therapeutic repore. The patient in the session was engaged and work in collaboration giving feedback about his triggers and symptoms over the past few weeks The patient spoke about  his wifes recovery from surgery procedure . The patient noted with the cooler weather he has been able to be more active and this has helped him with management of his mental health symptoms.The OPT therapist utilized Cognitive Behavioral Therapy through cognitive restructuring as well as worked with the patient on coping strategies to assist in management of symptoms as well as reviewed sleep, eating habits, and general health. The patient continues to work on acceptance of limited mobility based on health conditions.The patients spoke about working to monitor his health and remaining on his diet.The OPT therapist overviewed upcoming health appointments as  listed in the patients MyChart.   Suicidal/Homicidal: Nowithout intent/plan   Therapist Response:The OPT therapist worked with the patient for the patients scheduled session. The patient was engaged in his session and gave feedback in relation to triggers, symptoms, and behavior responses over the past few weeks. The OPT therapist worked with the patient utilizing an in session Cognitive Behavioral Therapy exercise. The patient was responsive in the session and verbalized, " I am working to be active and I have been more active since we have had some cooler weather and that has helped I have been able to get out of the house more )".The OPT therapist worked with the patient providing ongoing psychotherapy/education and coping skills review.The patient spoke about since getting approved for his SSI budgeting and management of his finances to help manage financial stress.The OPT therapist will continue treatment work with the patient in his next scheduled session   Plan: Return again in 2/3 weeks.   Diagnosis:      Axis I: PTSD/Panic Disorder/ Depression with anxiety  Axis II: No diagnosis      Collaboration of Care: No additional collaboration of care for this session.   Patient/Guardian was advised Release of Information must be obtained prior to any record release in order to collaborate their care with an outside provider. Patient/Guardian was advised if they have not already done so to contact the registration department to sign all necessary forms in order for [name] to release information regarding their care.    Consent: Patient/Guardian gives verbal consent for treatment and assignment of benefits for services provided during this visit. Patient/Guardian expressed understanding and agreed to proceed.  I discussed the assessment and treatment plan with the patient. The patient was provided an opportunity to ask questions and all were answered. The patient agreed with the plan and demonstrated  an understanding of the instructions.   The patient was advised to call back or seek an in-person evaluation if the symptoms worsen or if the condition fails to improve as anticipated.   I provided 55 minutes of non-face-to-face time during this encounter.   [name], LCSW    03/20/2023

## 2023-03-22 ENCOUNTER — Other Ambulatory Visit: Payer: Self-pay | Admitting: Gastroenterology

## 2023-03-23 ENCOUNTER — Other Ambulatory Visit: Payer: Self-pay

## 2023-03-23 DIAGNOSIS — K219 Gastro-esophageal reflux disease without esophagitis: Secondary | ICD-10-CM

## 2023-03-23 DIAGNOSIS — R1310 Dysphagia, unspecified: Secondary | ICD-10-CM

## 2023-03-23 MED ORDER — FAMOTIDINE 20 MG PO TABS: 20.0000 mg | ORAL_TABLET | ORAL | 0 refills | Status: DC | PRN

## 2023-03-23 NOTE — Telephone Encounter (Signed)
Rx sent to pharmacy   

## 2023-03-26 ENCOUNTER — Ambulatory Visit (INDEPENDENT_AMBULATORY_CARE_PROVIDER_SITE_OTHER): Payer: Medicaid Other | Admitting: Internal Medicine

## 2023-03-26 ENCOUNTER — Encounter: Payer: Self-pay | Admitting: Internal Medicine

## 2023-03-26 VITALS — BP 116/70 | HR 60 | Temp 97.6°F | Resp 18 | Wt 169.2 lb

## 2023-03-26 DIAGNOSIS — F17218 Nicotine dependence, cigarettes, with other nicotine-induced disorders: Secondary | ICD-10-CM

## 2023-03-26 DIAGNOSIS — Z91018 Allergy to other foods: Secondary | ICD-10-CM

## 2023-03-26 DIAGNOSIS — J310 Chronic rhinitis: Secondary | ICD-10-CM | POA: Diagnosis not present

## 2023-03-26 MED ORDER — FLUTICASONE PROPIONATE 50 MCG/ACT NA SUSP: 2.0000 | Freq: Every day | NASAL | 5 refills | Status: DC

## 2023-03-26 MED ORDER — AZELASTINE HCL 0.1 % NA SOLN: 1.0000 | Freq: Two times a day (BID) | NASAL | 5 refills | Status: DC | PRN

## 2023-03-26 MED ORDER — EPINEPHRINE 0.3 MG/0.3ML IJ SOAJ: 0.3000 mg | INTRAMUSCULAR | 1 refills | Status: DC | PRN

## 2023-03-26 NOTE — Patient Instructions (Addendum)
Chronic Rhinitis:  - Positive skin test 12/2022: none - Use nasal saline rinses before nose sprays such as with Neilmed Sinus Rinse.  Use distilled water.   - Use Flonase 2 sprays each nostril daily. Aim upward and outward. - Use Azelastine 1-2 sprays each nostril twice daily as needed for runny nose, drainage, sneezing, congestion. Aim upward and outward.  Alpha gal Syndrome - please strictly avoid all mammalian meat. Will recheck at 6 months to 1 year.   - okay to eat seafood, chicken, [place].  - for SKIN only reaction, okay to take Benadryl 25mg  capsules every 6 hours - for SKIN + ANY additional symptoms, OR IF concern for LIFE THREATENING reaction = Epipen Autoinjector EpiPen 0.3 mg. - If using Epinephrine autoinjector, call 911 or go to the ER.   COPD - Follow up with Pulm- Dr Vassie Loll or Dr. Lucious Groves. Call Pulm office to reschedule with another physician.  - Keep working on tobacco cessation.

## 2023-03-26 NOTE — Progress Notes (Signed)
FOLLOW UP Date of Service/Encounter:  03/26/23   Subjective:  Richard [name] (DOB: Mar 26, 1966) is a 57 y.o. male who [facility] for follow up for chronic rhinitis and alpha gal.   History obtained from: chart review and patient. At last visit, he underwent skin testing and was negative for aeroallergens.  He also is a current smoker and has been following with Pulm for COPD, Dr Sherene Sires.   Still has some congestion and drainage.  It is worse as the day progresses.  He is using Flonase daily.   Previously was seeing Dr. Ocie Doyne for cough, dyspnea on exertion.  He wishes to switch physicians.  He has been smoking since age 72, CT chest previously with emphysematous changes and also following for lung nodules.  On Symbicort and PRN Albuterol.  He is working on tobacco cessation; currently about 8 cigs/day.  Had previously gone down to 2.  Has been stuck at home and his mind wanders so smoking more.  He is going to start playing games, reading and walking to help cut down.  With avoidance of mammalian meat, he has noticed improvement in bloating and constipation.  Also noted that he had trouble with slower heart rate/palpations after eating an egg/cheese biscuit and wonders if it was cross contaminated with red meat. Did not have any other symptoms of hives, respiratory or GI.  He was hoping to recheck alpha gal because he wants to eat sausage.    Past Medical History: Past Medical History:  Diagnosis Date   Arthritis    Asthma    CAD (coronary artery disease)    a. Cardiac cath 07/2015 showed 65% distal Cx, 20% mid-distal LAD, 20% prox-distal RCA, EF 60%, EDP .   Colitis 1990   COPD (chronic obstructive pulmonary disease) (HCC)    Depression    Dysrhythmia    Essential hypertension    Fatty liver    Gastric ulcer 2003; 2012   2003: + esophagitis; negative H.pylori serology  2012: Dr. Darrick Penna, mild gastritis, Bravo PH probe placement,  negative H.pylori   GERD (gastroesophageal reflux disease)    Hepatic steatosis    History of hiatal hernia    Hyperlipemia    Overweight    Panic attacks    Paroxysmal atrial fibrillation (HCC)    Pulmonary nodules    Stroke (HCC)    TIA (transient ischemic attack)    Type II diabetes mellitus (HCC)     Objective:  BP 116/70   Pulse 60   Temp 97.6 F (36.4 C)   Resp 18   Wt 169 lb 4 oz (76.8 kg)   SpO2 99%   BMI 22.95 kg/m  Body mass index is 22.95 kg/m. Physical Exam: GEN: alert, well developed HEENT: clear conjunctiva, nose with mild inferior turbinate hypertrophy, pink nasal mucosa, no rhinorrhea HEART: regular rate and rhythm, no murmur LUNGS: clear to auscultation bilaterally, no coughing, unlabored respiration SKIN: no rashes or lesions  Assessment:   1. Chronic rhinitis   2. Allergy to alpha-gal   3. Cigarette nicotine dependence with other nicotine-induced disorder     Plan/Recommendations:  Chronic Rhinitis:  - Not well controlled, will add Azelastine PRN.  - Positive skin test 12/2022: none - Use nasal saline rinses before nose sprays such as with Neilmed Sinus Rinse.  Use distilled water.   - Use Flonase 2 sprays each nostril daily. Aim upward and outward. - Use Azelastine 1-2 sprays each nostril  twice daily as needed for runny nose, drainage, sneezing, congestion. Aim upward and outward.  Alpha gal Syndrome - please strictly avoid all mammalian meat. Will recheck at 6 months to 1 year.  No need to recheck earlier as this takes long time to drop.  - okay to eat seafood, chicken, [place].  - initial sxs: GI and intermittent palpitations.  - sIgE alpha gal 11/2022: 3.31 kU/L - for SKIN only reaction, okay to take Benadryl 25mg  capsules every 6 hours - for SKIN + ANY additional symptoms, OR IF concern for LIFE THREATENING reaction = Epipen Autoinjector EpiPen 0.3 mg. - If using Epinephrine autoinjector, call 911 or go to the ER.   COPD - Follow up with  Pulm- Dr Vassie Loll or Dr. Lucious Groves. Call Pulm office to reschedule with another physician; patient does not wish to see Dr. Sherene Sires  - Keep working on tobacco cessation.   Patient provided counseling on tobacco cessation today. Discussed the various impacts smoking has on health such as increased infections, lung disease, malignancy, heart disease, stroke etc. Patient is alert and competent.   Willingness to quit: working on it Methods/skills for cessation: wishes to quit on his own  Medication management for smoking: none Quit date: not set  Will discuss further at next follow up visit. Spent over 3 minutes for tobacco cessation counseling.         Return in about 6 months (around 09/23/2023).  Alesia Morin, MD [facility]

## 2023-03-27 ENCOUNTER — Encounter: Payer: Self-pay | Admitting: Internal Medicine

## 2023-03-27 ENCOUNTER — Ambulatory Visit: Payer: Medicaid Other | Attending: Internal Medicine | Admitting: Internal Medicine

## 2023-03-27 VITALS — BP 116/68 | HR 57 | Ht 72.0 in | Wt 169.8 lb

## 2023-03-27 DIAGNOSIS — I480 Paroxysmal atrial fibrillation: Secondary | ICD-10-CM | POA: Diagnosis not present

## 2023-03-27 NOTE — Patient Instructions (Signed)
Medication Instructions:  Your physician recommends that you continue on your current medications as directed. Please refer to the Current Medication list given to you today.  *If you need a refill on your cardiac medications before your next appointment, please call your pharmacy*   Lab Work: NONE   If you have labs (blood work) drawn today and your tests are completely normal, you will receive your results only by: MyChart Message (if you have MyChart) OR A paper copy in the mail If you have any lab test that is abnormal or we need to change your treatment, we will call you to review the results.   Testing/Procedures: NONE    Follow-Up: At [facility], you and your health needs are our priority.  As part of our continuing mission to provide you with exceptional heart care, we have created designated Provider Care Teams.  These Care Teams include your primary Cardiologist (physician) and Advanced Practice Providers (APPs -  Physician Assistants and Nurse Practitioners) who all work together to provide you with the care you need, when you need it.  We recommend signing up for the patient portal called "MyChart".  Sign up information is provided on this After Visit Summary.  MyChart is used to connect with patients for Virtual Visits (Telemedicine).  Patients are able to view lab/test results, encounter notes, upcoming appointments, etc.  Non-urgent messages can be sent to your provider as well.   To learn more about what you can do with MyChart, go to [email or web address].    Your next appointment:   6 month(s)  Provider:   [name], MD    Other Instructions Thank you for choosing [facility]!    

## 2023-03-27 NOTE — Progress Notes (Signed)
HPI Richard [name] returns for evaluation of PAF. He is a pleasant middle aged man with a h/o tobacco abuse, HTN, COPD, and sleep apnea (probable but undiagnosed), who has had trouble with PAF for over a year. He was treated with flecainide which did not improve his symptoms but then switched to [facility] and is improved. He has been on toprol as well.  He has had a cath with no obtructive CAD. He is still smoking 5-10 cigs a day but is trying to quit. He denies fever or chills. He has trouble with snoring according to his wife.   Allergies  Allergen Reactions   Alpha-Gal Anaphylaxis   Dexilant [Dexlansoprazole] Anaphylaxis   Mushroom Ext Cmplx(Shiitake-Reishi-Mait) Anaphylaxis    Rapid heart rate.   Penicillins Anaphylaxis    Has patient had a PCN reaction causing immediate rash, facial/tongue/throat swelling, SOB or lightheadedness with hypotension: Yes Has patient had a PCN reaction causing severe rash involving mucus membranes or skin necrosis: No Has patient had a PCN reaction that required hospitalization Yes Has patient had a PCN reaction occurring within the last 10 years: No If all of the above answers are "NO", then may proceed with Cephalosporin use.    Doxycycline Nausea And Vomiting          Current Outpatient Medications  Medication Sig Dispense Refill   acetaminophen (TYLENOL) 325 MG tablet Take 650 mg by mouth every 6 (six) hours as needed for moderate pain.     albuterol (VENTOLIN HFA) 108 (90 Base) MCG/ACT inhaler INHALE 2 PUFFS BY MOUTH EVERY 6 HOURS AS NEEDED FOR COUGHING, WHEEZING, OR SHORTNESS OF BREATH 20.1 g 0   ALPRAZolam (XANAX) 0.5 MG tablet Take one twice a day and two at bedtime 120 tablet 2   amLODipine (NORVASC) 10 MG tablet Take 1 tablet by mouth once daily 90 tablet 3   azelastine (ASTELIN) 0.1 % nasal spray Place 1 spray into both nostrils 2 (two) times daily as needed. Use in each nostril as directed 30 mL 5   baclofen (LIORESAL) 10 MG tablet Take 1  tablet (10 mg total) by mouth 2 (two) times daily. 30 minutes before breakfast and dinner 60 each 1   budesonide-formoterol (SYMBICORT) 80-4.5 MCG/ACT inhaler Take 2 puffs first thing in am and then another 2 puffs about 12 hours later. 1 each 12   chlorhexidine (PERIDEX) 0.12 % solution Gargle and spit 15 mL twice daily for the next 10 days. 473 mL 0   dronedarone (MULTAQ) 400 MG tablet Take 1 tablet (400 mg total) by mouth 2 (two) times daily with a meal. 180 tablet 3   EPINEPHrine 0.3 mg/0.3 mL IJ SOAJ injection Inject 0.3 mg into the muscle as needed for anaphylaxis. 2 each 1   esomeprazole (NEXIUM) 20 MG capsule Take 20 mg by mouth 2 (two) times daily. 2 caps am, 2 caps pm     famotidine (PEPCID) 20 MG tablet Take 1 tablet by mouth twice daily 180 tablet 0   famotidine (PEPCID) 20 MG tablet Take 1 tablet (20 mg total) by mouth as needed for heartburn or indigestion. 180 tablet 0   fluticasone (FLONASE) 50 MCG/ACT nasal spray Place 2 sprays into both nostrils daily. 16 g 5   metoprolol succinate (TOPROL XL) 25 MG 24 hr tablet Take 0.5 tablets (12.5 mg total) by mouth daily. 45 tablet 3   nitroGLYCERIN (NITROSTAT) 0.4 MG SL tablet Place 1 tablet (0.4 mg total) under the tongue every 5 (  five) minutes x 3 doses as needed for chest pain (if no relief after 3rd dose, proceed to the ED for an evaluation). 25 tablet 3   potassium chloride SA (KLOR-CON M) 20 MEQ tablet Take 1 tablet (20 mEq total) by mouth daily. 30 tablet 6   rivaroxaban (XARELTO) 20 MG TABS tablet TAKE 1 TABLET BY MOUTH ONCE DAILY WITH SUPPER 90 tablet 1   simvastatin (ZOCOR) 20 MG tablet Take 1 tablet by mouth once daily 90 tablet 3   vitamin B-12 (CYANOCOBALAMIN) 50 MCG tablet Take 50 mcg by mouth daily.     VITAMIN D PO Take 1 tablet by mouth daily.     sucralfate (CARAFATE) 1 GM/10ML suspension Take 10 mLs (1 g total) by mouth 4 (four) times daily -  with meals and at bedtime for 14 days. 560 mL 0   No current  facility-administered medications for this visit.     Past Medical History:  Diagnosis Date   Arthritis    Asthma    CAD (coronary artery disease)    a. Cardiac cath 07/2015 showed 65% distal Cx, 20% mid-distal LAD, 20% prox-distal RCA, EF 60%, EDP .   Colitis 1990   COPD (chronic obstructive pulmonary disease) (HCC)    Depression    Dysrhythmia    Essential hypertension    Fatty liver    Gastric ulcer 2003; 2012   2003: + esophagitis; negative H.pylori serology  2012: Dr. Darrick Penna, mild gastritis, Bravo PH probe placement, negative H.pylori   GERD (gastroesophageal reflux disease)    Hepatic steatosis    History of hiatal hernia    Hyperlipemia    Overweight    Panic attacks    Paroxysmal atrial fibrillation (HCC)    Pulmonary nodules    Stroke (HCC)    TIA (transient ischemic attack)    Type II diabetes mellitus (HCC)     ROS:   All systems reviewed and negative except as noted in the HPI.   Past Surgical History:  Procedure Laterality Date   BALLOON DILATION N/A 07/25/2021   Procedure: BALLOON DILATION;  Surgeon: Lanelle Bal, DO;  [facility];  Service: Endoscopy;  Laterality: N/A;   BIOPSY  07/25/2021   Procedure: BIOPSY;  Surgeon: Lanelle Bal, DO;  [facility];  Service: Endoscopy;;   BIOPSY  12/04/2022   Procedure: BIOPSY;  Surgeon: Lanelle Bal, DO;  [facility];  Service: Endoscopy;;   BRAVO PH STUDY  05/03/2011   UXN:ATFT gastritis/normal esophagus and duodenum   CARDIAC CATHETERIZATION  1990s X 1; 2005; 08/12/2015   CARDIAC CATHETERIZATION N/A 08/12/2015   Procedure: Left Heart Cath and Coronary Angiography;  Surgeon: Lyn Records, MD; LAD 20%, CFX 65%, RCA 20%, EF 60%    COLONOSCOPY  1990   COLONOSCOPY WITH PROPOFOL N/A 11/21/2016   Dr. Darrick Penna: non-thrombosed external hemorrhoids, one 6 mm polyp (polypoid lesion), internal hemorrhoids. TI Normal. 10 years screening   ESOPHAGOGASTRODUODENOSCOPY  05/03/2011    DDU:KGUR gastritis   ESOPHAGOGASTRODUODENOSCOPY (EGD) WITH PROPOFOL N/A 07/25/2021   Procedure: ESOPHAGOGASTRODUODENOSCOPY (EGD) WITH PROPOFOL;  Surgeon: Lanelle Bal, DO;  [facility];  Service: Endoscopy;  Laterality: N/A;  1:30pm   ESOPHAGOGASTRODUODENOSCOPY (EGD) WITH PROPOFOL N/A 12/04/2022   Procedure: ESOPHAGOGASTRODUODENOSCOPY (EGD) WITH PROPOFOL;  Surgeon: Lanelle Bal, DO;  [facility];  Service: Endoscopy;  Laterality: N/A;  8:00AM;ASA 3   NECK MASS EXCISION Right    "done in dr's office;  behind right ear/side of ncek"   POLYPECTOMY  11/21/2016   Procedure: POLYPECTOMY;  Surgeon: West Bali, MD;  [facility];  Service: Endoscopy;;  descending colon polyp   SHOULDER ARTHROSCOPY W/ ROTATOR CUFF REPAIR Right 2006   acromioclavicular joint arthrosis     Family History  Problem Relation Age of Onset   Lung cancer Mother    Alcohol abuse Mother    Heart attack Father 5   Diabetes Father    Alcohol abuse Father    Asthma Sister    Anxiety disorder Sister    Depression Sister    Anxiety disorder Sister    Hypertension Brother    Hypertension Brother    Heart attack Brother 56   Diabetes Brother    Hypertension Brother    Seizures Brother    Dementia Paternal Uncle    ADD / ADHD Daughter    Dementia Cousin    Colon cancer Neg Hx    Drug abuse Neg Hx    Bipolar disorder Neg Hx    OCD Neg Hx    Paranoid behavior Neg Hx    Schizophrenia Neg Hx    Sexual abuse Neg Hx    Physical abuse Neg Hx      Social History   Socioeconomic History   Marital status: Married    Spouse name: Not on file   Number of children: Not on file   Years of education: Not on file   Highest education level: Not on file  Occupational History   Occupation: full time    Employer: UNEMPLOYED  Tobacco Use   Smoking status: Every Day    Average packs/day: 1 pack/day for 40.0 years (39.2 ttl pk-yrs)    Types: Cigarettes    Start date: 07/17/1982    Smokeless tobacco: Never   Tobacco comments:    1/2 pack a day  Vaping Use   Vaping status: Never Used  Substance and Sexual Activity   Alcohol use: Not Currently   Drug use: No   Sexual activity: Yes    Birth control/protection: None  Other Topics Concern   Not on file  Social History Narrative   Pt lives in [place] with wife.  5 children.  Unemployed due to panic attacks and back pain   Social Determinants of Health   Financial Resource Strain: Not on file  Food Insecurity: Not on file  Transportation Needs: Not on file  Physical Activity: Not on file  Stress: Not on file  Social Connections: Not on file  Intimate Partner Violence: Not on file     BP 116/68 (BP Location: Left Arm, Patient Position: Sitting, Cuff Size: Normal)   Pulse (!) 57   Ht 6' (1.829 m)   Wt 169 lb 12.8 oz (77 kg)   SpO2 98%   BMI 23.03 kg/m   Physical Exam:  Well appearing NAD HEENT: Unremarkable Neck:  No JVD, no thyromegally Lymphatics:  No adenopathy Back:  No CVA tenderness Lungs:  Clear HEART:  Regular rate rhythm, no murmurs, no rubs, no clicks Abd:  soft, positive bowel sounds, no organomegally, no rebound, no guarding Ext:  2 plus pulses, no edema, no cyanosis, no clubbing Skin:  No rashes no nodules Neuro:  CN II through XII intact, motor grossly intact   Assess/Plan:  PAF - He will continue multaq. Appears to be improved though still having rare break throughs of atrial fib. I offered him the option of ablation or dofetilide but he  wants to continue as he is. HTN - his bp is  controlled. No change in meds. Coags - he has not had bleeding on xarelto. 4.   Tobacco abuse - he is encouraged to go ahead and stop smoking. We will give him nicotine patches     Dorathy Daft

## 2023-04-10 ENCOUNTER — Ambulatory Visit (INDEPENDENT_AMBULATORY_CARE_PROVIDER_SITE_OTHER): Payer: Medicaid Other | Admitting: Clinical

## 2023-04-10 DIAGNOSIS — F331 Major depressive disorder, recurrent, moderate: Secondary | ICD-10-CM | POA: Diagnosis not present

## 2023-04-10 DIAGNOSIS — F4310 Post-traumatic stress disorder, unspecified: Secondary | ICD-10-CM | POA: Diagnosis not present

## 2023-04-10 DIAGNOSIS — F4001 Agoraphobia with panic disorder: Secondary | ICD-10-CM

## 2023-04-10 DIAGNOSIS — F419 Anxiety disorder, unspecified: Secondary | ICD-10-CM

## 2023-04-10 NOTE — Progress Notes (Signed)
Virtual Visit via Video Note  I connected with Richard [name] on 04/10/23 at  1:00 PM EDT by a video enabled telemedicine application and verified that I am speaking with the correct person using two identifiers.  Location: Patient: home Provider: office   I discussed the limitations of evaluation and management by telemedicine and the availability of in person appointments. The patient expressed understanding and agreed to proceed.   THERAPIST PROGRESS NOTE     Session Time: 1:00 PM-1:55 PM   Participation Level: Active   Behavioral Response: Casual and Alert,Anxious   Type of Therapy: Individual Therapy   Treatment Goals addressed: Mood and Anxiety   Interventions: CBT   Summary: Richard [name] is a 57 y.o. male who presents with panic disorder/depression with anxiety/and PTSD . The OPT therapist worked with the patient for his OPT treatment. The OPT therapist utilized Motivational Interviewing to assist in creating therapeutic repore. The patient in the session was engaged and work in collaboration giving feedback about his triggers and symptoms over the past few weeks The patient spoke about his wifes ongoing recovery from surgery procedure . The patient noted with the cooler weather he has been able to be more active and this has helped him with management of his mental health symptoms, however, he has been working to sync his mind to what his body is telling him in relation to how much activity he can realistically do. The patient noted his brain tells him he can go go go and his body tells him brake break break. The patient has health conditions that contribute to his ability to regulate his energy level including having Cardiological Afib. The OPT therapist utilized Cognitive Behavioral Therapy through cognitive restructuring as well as worked with the patient on coping strategies to assist in management of symptoms as well as reviewed sleep, eating habits, and general health. The  patient continues to work on acceptance of limited mobility based on health conditions.The patients spoke about working to monitor his health and remaining on his diet as indicated post discovering a health condition after a tick bite.The OPT therapist overviewed upcoming health appointments as listed in the patients MyChart.   Suicidal/Homicidal: Nowithout intent/plan   Therapist Response:The OPT therapist worked with the patient for the patients scheduled session. The patient was engaged in his session and gave feedback in relation to triggers, symptoms, and behavior responses over the past few weeks. The OPT therapist worked with the patient utilizing an in session Cognitive Behavioral Therapy exercise. The patient was responsive in the session and verbalized, " I am working to be active and I have been more active since we have had some cooler weather and that has helped I have been able to get out of the house more, but I have to keep trying to sync my brain and my body because I am use to being able to push through and work hard but now with my health conditions I have to list to my body and take breaks".The OPT therapist worked with the patient providing ongoing psychotherapy/education and coping skills review.The patient spoke about since getting approved for his SSI budgeting and management of his finances to help manage financial stress, however, his wife being out of work for longer than anticipated due to recovery from surgery procedure has added financial stress as the patient noted her company has already filled her position. The OPT therapist will continue treatment work with the patient in his next scheduled session   Plan:  Return again in 2/3 weeks.   Diagnosis:      Axis I: PTSD/Panic Disorder/ Depression with anxiety  Axis II: No diagnosis      Collaboration of Care: No additional collaboration of care for this session.   Patient/Guardian was advised Release of Information must be  obtained prior to any record release in order to collaborate their care with an outside provider. Patient/Guardian was advised if they have not already done so to contact the registration department to sign all necessary forms in order for [name] to release information regarding their care.    Consent: Patient/Guardian gives verbal consent for treatment and assignment of benefits for services provided during this visit. Patient/Guardian expressed understanding and agreed to proceed.    I discussed the assessment and treatment plan with the patient. The patient was provided an opportunity to ask questions and all were answered. The patient agreed with the plan and demonstrated an understanding of the instructions.   The patient was advised to call back or seek an in-person evaluation if the symptoms worsen or if the condition fails to improve as anticipated.   I provided 55 minutes of non-face-to-face time during this encounter.   [name], LCSW    04/10/2023

## 2023-04-17 DIAGNOSIS — Z419 Encounter for procedure for purposes other than remedying health state, unspecified: Secondary | ICD-10-CM | POA: Diagnosis not present

## 2023-04-24 ENCOUNTER — Emergency Department (HOSPITAL_COMMUNITY): Payer: Medicaid Other

## 2023-04-24 ENCOUNTER — Other Ambulatory Visit: Payer: Self-pay

## 2023-04-24 ENCOUNTER — Emergency Department (HOSPITAL_COMMUNITY)
Admission: EM | Admit: 2023-04-24 | Discharge: 2023-04-24 | Disposition: A | Discharge status: Home / Self Care | Payer: Medicaid Other | Attending: Emergency Medicine | Admitting: Emergency Medicine

## 2023-04-24 ENCOUNTER — Encounter (HOSPITAL_COMMUNITY): Payer: Self-pay | Admitting: Emergency Medicine

## 2023-04-24 ENCOUNTER — Telehealth: Payer: Self-pay | Admitting: Cardiology

## 2023-04-24 DIAGNOSIS — I10 Essential (primary) hypertension: Secondary | ICD-10-CM | POA: Diagnosis not present

## 2023-04-24 DIAGNOSIS — J449 Chronic obstructive pulmonary disease, unspecified: Secondary | ICD-10-CM | POA: Insufficient documentation

## 2023-04-24 DIAGNOSIS — R079 Chest pain, unspecified: Secondary | ICD-10-CM | POA: Insufficient documentation

## 2023-04-24 DIAGNOSIS — Z79899 Other long term (current) drug therapy: Secondary | ICD-10-CM | POA: Insufficient documentation

## 2023-04-24 DIAGNOSIS — I2510 Atherosclerotic heart disease of native coronary artery without angina pectoris: Secondary | ICD-10-CM | POA: Diagnosis not present

## 2023-04-24 DIAGNOSIS — I4891 Unspecified atrial fibrillation: Secondary | ICD-10-CM | POA: Insufficient documentation

## 2023-04-24 DIAGNOSIS — J45909 Unspecified asthma, uncomplicated: Secondary | ICD-10-CM | POA: Diagnosis not present

## 2023-04-24 DIAGNOSIS — R0789 Other chest pain: Secondary | ICD-10-CM | POA: Diagnosis not present

## 2023-04-24 DIAGNOSIS — Z7951 Long term (current) use of inhaled steroids: Secondary | ICD-10-CM | POA: Insufficient documentation

## 2023-04-24 LAB — BASIC METABOLIC PANEL
Anion gap: 9 (ref 5–15)
BUN: 16 mg/dL (ref 6–20)
CO2: 26 mmol/L (ref 22–32)
Calcium: 8.8 mg/dL — ABNORMAL LOW (ref 8.9–10.3)
Chloride: 103 mmol/L (ref 98–111)
Creatinine, Ser: 0.92 mg/dL (ref 0.61–1.24)
GFR, Estimated: >60 mL/min (ref >60)
Glucose, Bld: 154 mg/dL — ABNORMAL HIGH (ref 70–99)
Potassium: 3.6 mmol/L (ref 3.5–5.1)
Sodium: 138 mmol/L (ref 135–145)

## 2023-04-24 LAB — CBC
HCT: 44.5 % (ref 39.0–52.0)
Hemoglobin: 15.2 g/dL (ref 13.0–17.0)
MCH: 31.5 pg (ref 26.0–34.0)
MCHC: 34.2 g/dL (ref 30.0–36.0)
MCV: 92.1 fL (ref 80.0–100.0)
Platelets: 178 10*3/uL (ref 150–400)
RBC: 4.83 MIL/uL (ref 4.22–5.81)
RDW: 12.2 % (ref 11.5–15.5)
WBC: 8 10*3/uL (ref 4.0–10.5)
nRBC: 0 % (ref 0.0–0.2)

## 2023-04-24 LAB — MAGNESIUM: Magnesium: 2.2 mg/dL (ref 1.7–2.4)

## 2023-04-24 LAB — TROPONIN I (HIGH SENSITIVITY)
Troponin I (High Sensitivity): <2 ng/L (ref <18)
Troponin I (High Sensitivity): <2 ng/L (ref <18)

## 2023-04-24 MED ORDER — ASPIRIN 81 MG PO CHEW
324.0000 mg | CHEWABLE_TABLET | Freq: Once | ORAL | Status: AC
  Administered 2023-04-24: 324 mg via ORAL
  Filled 2023-04-24: qty 4

## 2023-04-24 MED ORDER — POTASSIUM CHLORIDE CRYS ER 20 MEQ PO TBCR
40.0000 meq | EXTENDED_RELEASE_TABLET | Freq: Once | ORAL | Status: AC
  Administered 2023-04-24: 40 meq via ORAL
  Filled 2023-04-24: qty 2

## 2023-04-24 NOTE — ED Provider Notes (Signed)
[place] EMERGENCY DEPARTMENT AT [facility] Provider Note   CSN: [id number] Arrival date & time: 04/24/23  1428     History  Chief Complaint  Patient presents with   Chest Pain    Richard [name] is a 57 y.o. male.   Chest Pain Associated symptoms: nausea and palpitations   Patient presents for chest pain.  Medical history includes HLD, GERD, CAD, COPD, depression, HTN, PUD, atrial fibrillation, CVA, asthma.  He is followed by [facility].  His cardiologist is Dr. Diona Browner.  He had a low risk nuc med study in June.  This morning, he woke in his normal state of health.  At around 9 AM, he was reading his Bible.  He had acute onset of a left-sided chest pain.  He had radiations into his left arm.  He does have a history of panic attacks and did take a Xanax.  This did not improve his symptoms.  He took a sublingual nitroglycerin which did improve his symptoms.  At maximal, chest pain was 5/10 in severity. He is on Xarelto and has not missed any doses.  He has a history of intermittent atrial fibrillation.  Typically, he has a heart rate in the 50s.  This morning, when he was feeling unwell, he checked his heart rate on a pulse oximeter.  Heart rate was dropping down into the 30s.  He had some nausea which has since resolved.  Current discomfort is mild, 1/10 in severity.     Home Medications Prior to Admission medications   Medication Sig Start Date End Date Taking? Authorizing Provider  acetaminophen (TYLENOL) 325 MG tablet Take 650 mg by mouth every 6 (six) hours as needed for moderate pain.    [provider]  albuterol (VENTOLIN HFA) 108 (90 Base) MCG/ACT inhaler INHALE 2 PUFFS BY MOUTH EVERY 6 HOURS AS NEEDED FOR COUGHING, WHEEZING, OR SHORTNESS OF BREATH 06/19/22   Jacquelin Hawking, PA-C  ALPRAZolam Prudy Feeler) 0.5 MG tablet Take one twice a day and two at bedtime 02/16/23   Myrlene Broker, MD  amLODipine (NORVASC) 10 MG tablet Take 1 tablet by mouth once daily 12/07/22    Jonelle Sidle, MD  azelastine (ASTELIN) 0.1 % nasal spray Place 1 spray into both nostrils 2 (two) times daily as needed. Use in each nostril as directed 03/26/23   Birder Robson, MD  baclofen (LIORESAL) 10 MG tablet Take 1 tablet (10 mg total) by mouth 2 (two) times daily. 30 minutes before breakfast and dinner 01/25/23   Gelene Mink, NP  budesonide-formoterol [facility]) 80-4.5 MCG/ACT inhaler Take 2 puffs first thing in am and then another 2 puffs about 12 hours later. 01/02/23   Nyoka Cowden, MD  chlorhexidine (PERIDEX) 0.12 % solution Gargle and spit 15 mL twice daily for the next 10 days. 03/17/23   Leath-Warren, Sadie Haber, NP  dronedarone (MULTAQ) 400 MG tablet Take 1 tablet (400 mg total) by mouth 2 (two) times daily with a meal. 07/26/22 07/27/23  Marinus Maw, MD  EPINEPHrine 0.3 mg/0.3 mL IJ SOAJ injection Inject 0.3 mg into the muscle as needed for anaphylaxis. 03/26/23   Birder Robson, MD  esomeprazole (NEXIUM) 20 MG capsule Take 20 mg by mouth 2 (two) times daily. 2 caps am, 2 caps pm    [provider]  famotidine (PEPCID) 20 MG tablet Take 1 tablet by mouth twice daily 03/23/23   Gelene Mink, NP  famotidine (PEPCID) 20 MG tablet Take  1 tablet (20 mg total) by mouth as needed for heartburn or indigestion. 03/23/23   Gelene Mink, NP  fluticasone (FLONASE) 50 MCG/ACT nasal spray Place 2 sprays into both nostrils daily. 03/26/23   Birder Robson, MD  metoprolol succinate (TOPROL XL) 25 MG 24 hr tablet Take 0.5 tablets (12.5 mg total) by mouth daily. 07/28/22   Jonelle Sidle, MD  nitroGLYCERIN (NITROSTAT) 0.4 MG SL tablet Place 1 tablet (0.4 mg total) under the tongue every 5 (five) minutes x 3 doses as needed for chest pain (if no relief after 3rd dose, proceed to the ED for an evaluation). 07/26/22   Marinus Maw, MD  potassium chloride SA (KLOR-CON M) 20 MEQ tablet Take 1 tablet (20 mEq total) by mouth daily. 12/15/22   Dunn, Tacey Ruiz, PA-C  rivaroxaban (XARELTO) 20 MG  TABS tablet TAKE 1 TABLET BY MOUTH ONCE DAILY WITH SUPPER 03/12/23   Jonelle Sidle, MD  simvastatin (ZOCOR) 20 MG tablet Take 1 tablet by mouth once daily 07/14/22   Marinus Maw, MD  sucralfate (CARAFATE) 1 GM/10ML suspension Take 10 mLs (1 g total) by mouth 4 (four) times daily -  with meals and at bedtime for 14 days. 12/25/22 03/26/23  Gelene Mink, NP  vitamin B-12 (CYANOCOBALAMIN) 50 MCG tablet Take 50 mcg by mouth daily.    [provider]  VITAMIN D PO Take 1 tablet by mouth daily.    [provider]      Allergies    Alpha-gal, Dexilant [dexlansoprazole], Mushroom ext cmplx(shiitake-reishi-mait), Penicillins, and Doxycycline    Review of Systems   Review of Systems  Cardiovascular:  Positive for chest pain and palpitations.  Gastrointestinal:  Positive for nausea.  All other systems reviewed and are negative.   Physical Exam Updated Vital Signs BP 109/68   Pulse (!) 51   Temp 98.7 F (37.1 C) (Oral)   Resp 12   Ht 6' (1.829 m)   Wt 76.2 kg   SpO2 94%   BMI 22.78 kg/m  Physical Exam Vitals and nursing note reviewed.  Constitutional:      General: He is not in acute distress.    Appearance: He is well-developed. He is not ill-appearing, toxic-appearing or diaphoretic.  HENT:     Head: Normocephalic and atraumatic.  Eyes:     Conjunctiva/sclera: Conjunctivae normal.  Cardiovascular:     Rate and Rhythm: Normal rate and regular rhythm.     Heart sounds: No murmur heard. Pulmonary:     Effort: Pulmonary effort is normal. No respiratory distress.     Breath sounds: Normal breath sounds. No decreased breath sounds, wheezing, rhonchi or rales.  Chest:     Chest wall: No tenderness.  Abdominal:     Palpations: Abdomen is soft.     Tenderness: There is no abdominal tenderness.  Musculoskeletal:        General: No swelling. Normal range of motion.     Cervical back: Normal range of motion and neck supple.     Right lower leg: No edema.      Left lower leg: No edema.  Skin:    General: Skin is warm and dry.     Coloration: Skin is not cyanotic or pale.  Neurological:     General: No focal deficit present.     Mental Status: He is alert and oriented to person, place, and time.  Psychiatric:        Mood and Affect: Mood  normal.        Behavior: Behavior normal.     ED Results / Procedures / Treatments   Labs (all labs ordered are listed, but only abnormal results are displayed) Labs Reviewed  BASIC METABOLIC PANEL - Abnormal; Notable for the following components:      Result Value   Glucose, Bld 154 (*)    Calcium 8.8 (*)    All other components within normal limits  CBC  MAGNESIUM  TROPONIN I (HIGH SENSITIVITY)  TROPONIN I (HIGH SENSITIVITY)    EKG None  Radiology DG Chest 2 View  Result Date: 04/24/2023 CLINICAL DATA:  Chest pain. EXAM: CHEST - 2 VIEW COMPARISON:  Dec 08, 2022 FINDINGS: The heart size and mediastinal contours are within normal limits. There is no evidence of an acute infiltrate, pleural effusion or pneumothorax. The visualized skeletal structures are unremarkable. IMPRESSION: No active cardiopulmonary disease. Electronically Signed   By: Aram Candela M.D.   On: 04/24/2023 16:07    Procedures Procedures    Medications Ordered in ED Medications  potassium chloride SA (KLOR-CON M) CR tablet 40 mEq (40 mEq Oral Given 04/24/23 1831)  aspirin chewable tablet 324 mg (324 mg Oral Given 04/24/23 1901)    ED Course/ Medical Decision Making/ A&P                                 Medical Decision Making Amount and/or Complexity of Data Reviewed Labs: ordered. Radiology: ordered.  Risk OTC drugs. Prescription drug management.   This patient presents to the ED for concern of chest pain, this involves an extensive number of treatment options, and is a complaint that carries with it a high risk of complications and morbidity.  The differential diagnosis includes ACS, pneumonia, PE, GERD,  arrhythmia, anxiety   Co morbidities that complicate the patient evaluation  HLD, GERD, CAD, COPD, depression, HTN, PUD, atrial fibrillation, CVA, asthma   Additional history obtained:  Additional history obtained from patient's wife External records from outside source obtained and reviewed including EMR   Lab Tests:  I Ordered, and personally interpreted labs.  The pertinent results include: Normal hemoglobin, no leukocytosis, normal kidney function, normal electrolytes, normal troponins x 2   Imaging Studies ordered:  I ordered imaging studies including chest x-ray I independently visualized and interpreted imaging which showed no acute findings I agree with the radiologist interpretation   Cardiac Monitoring: / EKG:  The patient was maintained on a cardiac monitor.  I personally viewed and interpreted the cardiac monitored which showed an underlying rhythm of: Sinus rhythm   Consultations Obtained:  I requested consultation with the cardiologist, Dr. Anne Fu,  and discussed lab and imaging findings as well as pertinent plan - they recommend: Outpatient follow-up   Problem List / ED Course / Critical interventions / Medication management  Patient presenting for acute onset of left-sided chest pain this morning.  He had radiations into the left arm.  He had associated nausea.  Symptoms have since improved.  He did feel like chest pain improved following sublingual nitroglycerin.  Currently, he has very mild chest discomfort.  EKG shows sinus bradycardia without evidence of ST segment changes.  Workup was initiated prior to patient being bedded in the ED.  Initial troponin is reassuring.  He is well-appearing on exam.  His current heart rate is in the 50s which is his baseline.  He did report measurements of bradycardia in the  30s on pulse oximeter at home this morning.  Patient was placed on bedside cardiac monitor.  ASA was ordered.  His potassium today is 3.6.  He has been  advised to take potassium supplements to maintain a potassium of 4.0.  He has missed his potassium supplements lately.  Dose of Klor-Con was ordered.  Cardiology was consulted.  As with cardiologist on-call, Dr. Anne Fu, who recommends outpatient follow-up, given his resolution of symptoms and reassuring workup today.  On cardiac monitor, he maintained normal sinus rhythm most of the time.  He would occasionally have episodes of atrial fibrillation with a slow rate.  Heart rate remained in the 50s.  Patient does feel comfortable with discharge.  Cardiology referral was ordered.  He was discharged in stable condition. I ordered medication including potassium chloride for electrolyte optimization; ASA for chest pain Reevaluation of the patient after these medicines showed that the patient improved I have reviewed the patients home medicines and have made adjustments as needed   Social Determinants of Health:  Has access to outpatient care        Final Clinical Impression(s) / ED Diagnoses Final diagnoses:  Chest pain, unspecified type    Rx / DC Orders ED Discharge Orders          Ordered    Ambulatory referral to Cardiology       Comments: If you have not heard from the Cardiology office within the next 72 hours please call [phone].   04/24/23 2003              Gloris Manchester, MD 04/24/23 2004

## 2023-04-24 NOTE — Telephone Encounter (Signed)
Pt c/o of Chest Pain: STAT if active CP, including tightness, pressure, jaw pain, radiating pain to shoulder/upper arm/back, CP unrelieved by Nitro. Symptoms reported of SOB, nausea, vomiting, sweating.  1. Are you having CP right now? Yes    2. Are you experiencing any other symptoms (ex. SOB, nausea, vomiting, sweating)? HR in 40's-50's & Afib    3. Is your CP continuous or coming and going? Continuous    4. Have you taken Nitroglycerin? No    5. How long have you been experiencing CP? Started at 9 am    6. If NO CP at time of call then end call with telling Pt to call back or call 911 if Chest pain returns prior to return call from triage team.    Call transferred to nurse due to being STAT.

## 2023-04-24 NOTE — ED Triage Notes (Signed)
Pt complains of chest pain radiates down left arm and slow heart rate in the 30s. Pt states took nitroglycerin at home and felt a little better. Denies vomiting. Complains of weakness.

## 2023-04-24 NOTE — Discharge Instructions (Signed)
Test results today were reassuring.  You should follow-up with your cardiologist as soon as possible.  If you do not hear from their office in the next couple days, call the number below.  Return to the emergency department for any new or worsening symptoms of concern.

## 2023-04-24 NOTE — Telephone Encounter (Signed)
Spoke with wife and pt who reports that the pt starting having chest pain this morning at 9 am. Pt reports nausea and denies vomiting at this time. Pt rates CP 5/10. States that he does have SOB on and off, and tingling in left arm and shoulder. Encouraged pt to be evaluated in the ER. Pt and wife agree with plan of care. Please advise.

## 2023-04-26 ENCOUNTER — Encounter: Payer: Self-pay | Admitting: *Deleted

## 2023-04-26 ENCOUNTER — Ambulatory Visit: Payer: Medicaid Other | Admitting: Gastroenterology

## 2023-04-26 ENCOUNTER — Encounter: Payer: Self-pay | Admitting: Gastroenterology

## 2023-04-26 ENCOUNTER — Telehealth: Payer: Self-pay

## 2023-04-26 VITALS — BP 122/74 | HR 55 | Temp 97.4°F | Ht 72.0 in | Wt 168.4 lb

## 2023-04-26 DIAGNOSIS — R1310 Dysphagia, unspecified: Secondary | ICD-10-CM | POA: Diagnosis not present

## 2023-04-26 MED ORDER — ESOMEPRAZOLE MAGNESIUM 20 MG PO CPDR: 20.0000 mg | DELAYED_RELEASE_CAPSULE | Freq: Two times a day (BID) | ORAL | 5 refills | Status: DC

## 2023-04-26 NOTE — Addendum Note (Signed)
Addended by: Gelene Mink on: 04/26/2023 03:00 PM   Modules accepted: Orders

## 2023-04-26 NOTE — Patient Instructions (Signed)
Continue the Nexium twice a day.  I have ordered the xray test of your esophagus  Further recommendations to follow!  I enjoyed seeing you again today! I value our relationship and want to provide genuine, compassionate, and quality care. You may receive a survey regarding your visit with me, and I welcome your feedback! Thanks so much for taking the time to complete this. I look forward to seeing you again.      Gelene Mink, PhD, ANP-BC [facility]

## 2023-04-26 NOTE — Telephone Encounter (Signed)
Completed.  Thank you! 

## 2023-04-26 NOTE — Progress Notes (Signed)
Gastroenterology Office Note     Primary Care Physician:  Gilmore Laroche, FNP  Primary Gastroenterologist: Dr. Marletta Lor   Chief Complaint   Chief Complaint  Patient presents with   Follow-up    Gets a knot in his chest when he eats and it causes him to have afib and sweat.      History of Present Illness   Richard [name] is a 57 y.o. male presenting today with a history of chronic GERD, abdominal pain, dysphagia, constipation, weight loss. 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 CTA unremarkable in the past. HIDA with borderline EF of 33%.    Due to worsening dysphagia, he underwent early interval EGD May 2024 as noted below. He is unable to do manometry as unable to drive to [place].    Gest a knot feeling after eating. Night is worse. Was afraid to try bacfloen. Feels a knot in mid esophagus and coming through his back. Regurgitated water this morning. Believes he threw up the Nexium capsule.   EGD May 2024: abnormal esophageal motility, consistent with presbyesophagus, suspicious changes for Barrett's but negative path, gastritis, normal duodenum. Negative H.pylori.    EGD 07/2021: small hiatal hernia, abnormal esophageal motility, iron pill gastritis, no h.pylori, mucosal variant in duodenum (bx with focal lymphangiectasia, neg for celiac)   Last colonoscopy 2018, 1 benign polyp removed, recommended 10-year recall.      Past Medical History:  Diagnosis Date   Arthritis    Asthma    CAD (coronary artery disease)    a. Cardiac cath 07/2015 showed 65% distal Cx, 20% mid-distal LAD, 20% prox-distal RCA, EF 60%, EDP .   Colitis 1990   COPD (chronic obstructive pulmonary disease) (HCC)    Depression    Dysrhythmia    Essential hypertension    Fatty liver    Gastric ulcer 2003; 2012   2003: + esophagitis; negative H.pylori serology  2012: Dr. Darrick Penna, mild gastritis, Bravo PH probe placement, negative H.pylori   GERD  (gastroesophageal reflux disease)    Hepatic steatosis    History of hiatal hernia    Hyperlipemia    Overweight    Panic attacks    Paroxysmal atrial fibrillation (HCC)    Pulmonary nodules    Stroke (HCC)    TIA (transient ischemic attack)    Type II diabetes mellitus (HCC)     Past Surgical History:  Procedure Laterality Date   BALLOON DILATION N/A 07/25/2021   Procedure: BALLOON DILATION;  Surgeon: Lanelle Bal, DO;  [facility];  Service: Endoscopy;  Laterality: N/A;   BIOPSY  07/25/2021   Procedure: BIOPSY;  Surgeon: Lanelle Bal, DO;  [facility];  Service: Endoscopy;;   BIOPSY  12/04/2022   Procedure: BIOPSY;  Surgeon: Lanelle Bal, DO;  [facility];  Service: Endoscopy;;   BRAVO PH STUDY  05/03/2011   NFA:OZHY gastritis/normal esophagus and duodenum   CARDIAC CATHETERIZATION  1990s X 1; 2005; 08/12/2015   CARDIAC CATHETERIZATION N/A 08/12/2015   Procedure: Left Heart Cath and Coronary Angiography;  Surgeon: Lyn Records, MD; LAD 20%, CFX 65%, RCA 20%, EF 60%    COLONOSCOPY  1990   COLONOSCOPY WITH PROPOFOL N/A 11/21/2016   Dr. Darrick Penna: non-thrombosed external hemorrhoids, one 6 mm polyp (polypoid lesion), internal hemorrhoids. TI Normal. 10 years screening   ESOPHAGOGASTRODUODENOSCOPY  05/03/2011   QMV:HQIO gastritis   ESOPHAGOGASTRODUODENOSCOPY (EGD) WITH PROPOFOL N/A 07/25/2021   Procedure: ESOPHAGOGASTRODUODENOSCOPY (EGD)  WITH PROPOFOL;  Surgeon: Lanelle Bal, DO;  [facility];  Service: Endoscopy;  Laterality: N/A;  1:30pm   ESOPHAGOGASTRODUODENOSCOPY (EGD) WITH PROPOFOL N/A 12/04/2022   Procedure: ESOPHAGOGASTRODUODENOSCOPY (EGD) WITH PROPOFOL;  Surgeon: Lanelle Bal, DO;  [facility];  Service: Endoscopy;  Laterality: N/A;  8:00AM;ASA 3   NECK MASS EXCISION Right    "done in dr's office; behind right ear/side of ncek"   POLYPECTOMY  11/21/2016   Procedure: POLYPECTOMY;  Surgeon: West Bali, MD;  [facility];  Service: Endoscopy;;  descending colon polyp   SHOULDER ARTHROSCOPY W/ ROTATOR CUFF REPAIR Right 2006   acromioclavicular joint arthrosis    Current Outpatient Medications  Medication Sig Dispense Refill   acetaminophen (TYLENOL) 325 MG tablet Take 650 mg by mouth every 6 (six) hours as needed for moderate pain.     albuterol (VENTOLIN HFA) 108 (90 Base) MCG/ACT inhaler INHALE 2 PUFFS BY MOUTH EVERY 6 HOURS AS NEEDED FOR COUGHING, WHEEZING, OR SHORTNESS OF BREATH 20.1 g 0   ALPRAZolam (XANAX) 0.5 MG tablet Take one twice a day and two at bedtime 120 tablet 2   amLODipine (NORVASC) 10 MG tablet Take 1 tablet by mouth once daily 90 tablet 3   azelastine (ASTELIN) 0.1 % nasal spray Place 1 spray into both nostrils 2 (two) times daily as needed. Use in each nostril as directed 30 mL 5   baclofen (LIORESAL) 10 MG tablet Take 1 tablet (10 mg total) by mouth 2 (two) times daily. 30 minutes before breakfast and dinner 60 each 1   budesonide-formoterol (SYMBICORT) 80-4.5 MCG/ACT inhaler Take 2 puffs first thing in am and then another 2 puffs about 12 hours later. 1 each 12   chlorhexidine (PERIDEX) 0.12 % solution Gargle and spit 15 mL twice daily for the next 10 days. 473 mL 0   dronedarone (MULTAQ) 400 MG tablet Take 1 tablet (400 mg total) by mouth 2 (two) times daily with a meal. 180 tablet 3   EPINEPHrine 0.3 mg/0.3 mL IJ SOAJ injection Inject 0.3 mg into the muscle as needed for anaphylaxis. 2 each 1   esomeprazole (NEXIUM) 20 MG capsule Take 20 mg by mouth 2 (two) times daily. 2 caps am, 2 caps pm     fluticasone (FLONASE) 50 MCG/ACT nasal spray Place 2 sprays into both nostrils daily. 16 g 5   metoprolol succinate (TOPROL XL) 25 MG 24 hr tablet Take 0.5 tablets (12.5 mg total) by mouth daily. 45 tablet 3   nitroGLYCERIN (NITROSTAT) 0.4 MG SL tablet Place 1 tablet (0.4 mg total) under the tongue every 5 (five) minutes x 3 doses as needed for chest pain (if no  relief after 3rd dose, proceed to the ED for an evaluation). 25 tablet 3   potassium chloride SA (KLOR-CON M) 20 MEQ tablet Take 1 tablet (20 mEq total) by mouth daily. 30 tablet 6   rivaroxaban (XARELTO) 20 MG TABS tablet TAKE 1 TABLET BY MOUTH ONCE DAILY WITH SUPPER 90 tablet 1   simvastatin (ZOCOR) 20 MG tablet Take 1 tablet by mouth once daily 90 tablet 3   sucralfate (CARAFATE) 1 GM/10ML suspension Take 10 mLs (1 g total) by mouth 4 (four) times daily -  with meals and at bedtime for 14 days. 560 mL 0   vitamin B-12 (CYANOCOBALAMIN) 50 MCG tablet Take 50 mcg by mouth daily.     VITAMIN D PO Take 1 tablet by mouth daily.  No current facility-administered medications for this visit.    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/2022   Dexilant [dexlansoprazole] Anaphylaxis 01/20/2015   Mushroom ext cmplx(shiitake-reishi-mait) Anaphylaxis 03/22/2011   Penicillins Anaphylaxis    Doxycycline Nausea And Vomiting 04/18/2016    Family History  Problem Relation Age of Onset   Lung cancer Mother    Alcohol abuse Mother    Heart attack Father 3   Diabetes Father    Alcohol abuse Father    Asthma Sister    Anxiety disorder Sister    Depression Sister    Anxiety disorder Sister    Hypertension Brother    Hypertension Brother    Heart attack Brother 15   Diabetes Brother    Hypertension Brother    Seizures Brother    Dementia Paternal Uncle    ADD / ADHD Daughter    Dementia Cousin    Colon cancer Neg Hx    Drug abuse Neg Hx    Bipolar disorder Neg Hx    OCD Neg Hx    Paranoid behavior Neg Hx    Schizophrenia Neg Hx    Sexual abuse Neg Hx    Physical abuse Neg Hx     Social History   Socioeconomic History   Marital status: Married    Spouse name: Not on file   Number of children: Not on file   Years of education: Not on file   Highest education level: Not on file  Occupational History    Occupation: full time    Employer: UNEMPLOYED  Tobacco Use   Smoking status: Every Day    Current packs/day: 0.50    Average packs/day: 1 pack/day for 40.8 years (39.6 ttl pk-yrs)    Types: Cigarettes    Start date: 07/17/1982   Smokeless tobacco: Never   Tobacco comments:    1/2 pack a day  Vaping Use   Vaping status: Never Used  Substance and Sexual Activity   Alcohol use: Not Currently   Drug use: No   Sexual activity: Yes    Birth control/protection: None  Other Topics Concern   Not on file  Social History Narrative   Pt lives in [place] with wife.  5 children.  Unemployed due to panic attacks and back pain   Social Determinants of Health   Financial Resource Strain: Not on file  Food Insecurity: Not on file  Transportation Needs: Not on file  Physical Activity: Not on file  Stress: Not on file  Social Connections: Not on file  Intimate Partner Violence: Not on file     Review of Systems   Gen: Denies any fever, chills, fatigue, weight loss, lack of appetite.  CV: Denies chest pain, heart palpitations, peripheral edema, syncope.  Resp: Denies shortness of breath at rest or with exertion. Denies wheezing or cough.  GI: Denies dysphagia or odynophagia. Denies jaundice, hematemesis, fecal incontinence. GU : Denies urinary burning, urinary frequency, urinary hesitancy MS: Denies joint pain, muscle weakness, cramps, or limitation of movement.  Derm: Denies rash, itching, dry skin Psych: Denies depression, anxiety, memory loss, and confusion Heme: Denies bruising, bleeding, and enlarged lymph nodes.   Physical Exam   BP 122/74 (BP Location: Right Arm, Patient Position: Sitting, Cuff Size: Normal)   Pulse (!) 55   Temp (!) 97.4 F (36.3 C) (Oral)   Ht 6' (1.829 m)   Wt 168 lb 6.4 oz (76.4 kg)  SpO2 97%   BMI 22.84 kg/m  General:   Alert and oriented. Pleasant and cooperative. Well-nourished and well-developed.  Head:  Normocephalic and atraumatic. Eyes:   Without icterus Abdomen:  +BS, soft, non-tender and non-distended. No HSM noted. No guarding or rebound. No masses appreciated.  Rectal:  Deferred  Msk:  Symmetrical without gross deformities. Normal posture. Extremities:  Without edema. Neurologic:  Alert and  oriented x4;  grossly normal neurologically. Skin:  Intact without significant lesions or rashes. Psych:  Alert and cooperative. Normal mood and affect.   Assessment   Richard [name] is a 57 y.o. male presenting today with a history of chronic GERD, abdominal pain, dysphagia, constipation, weight loss. 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 CTA unremarkable in the past. HIDA with borderline EF of 33%.   Dysphagia: EGD May 2024 on file with likely presbyesophagus. Persistent symptoms of feeling a knot mid esophagus and regurgitation. However, he has canceled manometry due to concerns with driving in [place]. Will update BPE. Limited options as not able to pursue manometry. We are maximizing PPI at BID. Could consider baclofen BID before meals vs peppermint oil as trial first. Will await BPE.    PLAN   Nexium 40 mg BID BPE updated Further recommendations to follow   Gelene Mink, PhD, ANP-BC [facility]

## 2023-04-26 NOTE — Telephone Encounter (Signed)
PA for brand name Nexium 20 mg BID was approved. Please send an Rx for a 30 day supply to [facility].

## 2023-04-27 ENCOUNTER — Ambulatory Visit: Payer: Medicaid Other | Attending: Student | Admitting: Student

## 2023-04-27 ENCOUNTER — Telehealth: Payer: Self-pay | Admitting: Cardiology

## 2023-04-27 ENCOUNTER — Encounter: Payer: Self-pay | Admitting: Student

## 2023-04-27 VITALS — BP 108/54 | HR 64 | Ht 72.0 in | Wt 169.0 lb

## 2023-04-27 DIAGNOSIS — I10 Essential (primary) hypertension: Secondary | ICD-10-CM | POA: Diagnosis not present

## 2023-04-27 DIAGNOSIS — R079 Chest pain, unspecified: Secondary | ICD-10-CM | POA: Diagnosis not present

## 2023-04-27 DIAGNOSIS — I480 Paroxysmal atrial fibrillation: Secondary | ICD-10-CM

## 2023-04-27 DIAGNOSIS — Z7901 Long term (current) use of anticoagulants: Secondary | ICD-10-CM | POA: Diagnosis not present

## 2023-04-27 DIAGNOSIS — E785 Hyperlipidemia, unspecified: Secondary | ICD-10-CM | POA: Diagnosis not present

## 2023-04-27 DIAGNOSIS — R0609 Other forms of dyspnea: Secondary | ICD-10-CM | POA: Diagnosis not present

## 2023-04-27 DIAGNOSIS — I6523 Occlusion and stenosis of bilateral carotid arteries: Secondary | ICD-10-CM

## 2023-04-27 DIAGNOSIS — I2510 Atherosclerotic heart disease of native coronary artery without angina pectoris: Secondary | ICD-10-CM | POA: Diagnosis not present

## 2023-04-27 NOTE — Patient Instructions (Addendum)
   Your cardiac CT will be scheduled at one of the below locations:   [facility] [address] [place], [place] [address] [phone]  If scheduled at [facility], please arrive at the [facility] (Entrance C2) of [facility] 30 minutes prior to test start time. You can use the FREE valet parking offered at entrance C (encouraged to control the heart rate for the test)  Proceed to the [facility] Radiology Department (first floor) to check-in and test prep.  All radiology patients and guests should use entrance C2 at [facility], accessed from [facility], even though the hospital's physical address listed is [address].      Please follow these instructions carefully (unless otherwise directed):  An IV will be required for this test and Nitroglycerin will be given.  Hold all erectile dysfunction medications at least 3 days (72 hrs) prior to test. (Ie viagra, cialis, sildenafil, tadalafil, etc)   On the Night Before the Test: Be sure to Drink plenty of water. Do not consume any caffeinated/decaffeinated beverages or chocolate 12 hours prior to your test. Do not take any antihistamines 12 hours prior to your test.  On the Day of the Test: Drink plenty of water until 1 hour prior to the test. Do not eat any food 1 hour prior to test. You may take your regular medications prior to the test.  Take Metoprolol  two hours prior to test. HOLD AMLODIPINE THE MORNING OF TEST If you take Furosemide/Hydrochlorothiazide/Spironolactone, please HOLD on the morning of the test.  After the Test: Drink plenty of water. After receiving IV contrast, you may experience a mild flushed feeling. This is normal. On occasion, you may experience a mild rash up to 24 hours after the test. This is not dangerous. If this occurs, you can take Benadryl 25 mg and increase your fluid intake. If you experience trouble breathing,  this can be serious. If it is severe call 911 IMMEDIATELY. If it is mild, please call our office. If you take any of these medications: Glipizide/Metformin, Avandament, Glucavance, please do not take 48 hours after completing test unless otherwise instructed.  We will call to schedule your test 2-4 weeks out understanding that some insurance companies will need an authorization prior to the service being performed.   For more information and frequently asked questions, please visit our website : [email or web address]  For non-scheduling related questions, please contact the cardiac imaging nurse navigator should you have any questions/concerns: Cardiac Imaging Nurse Navigators Direct Office Dial: [phone]   For scheduling needs, including cancellations and rescheduling, please call [place], [phone].

## 2023-04-27 NOTE — Progress Notes (Signed)
Cardiology Office Note    Date:  04/27/2023  ID:  Richard [name], DOB [birth year], MRN [id number] Cardiologist: Nona Dell, MD   EP: Dr. Ladona Ridgel  History of Present Illness:    Richard [name] is a 57 y.o. male with past medical history of CAD (nonobstructive disease by cath in 07/2015, low-risk NST in 12/2020), paroxysmal atrial fibrillation, carotid artery disease, HTN, HLD, asthma, anxiety and depression who presents to the office today for evaluation of chest pain.  He was examined by myself in 01/2023 and reported that his episodes of chest pain had improved since his last office visit. Stress test in 12/2022 was a low-risk study. He had been evaluated by GI and started on Baclofen to see if this would help with his symptoms. He did see Dr. Ladona Ridgel in 03/2023 and reported occasional breakthrough episodes and options of ablation or Tikosyn were reviewed but he wished to continue his current medical therapy.  He called the office on 04/24/2023 reporting chest pain which had started earlier in the day with pain into his arm and shoulder and associated dyspnea. He thought symptoms might be due to a panic attack and took Xanax but pain persisted, therefore he took nitroglycerin with improvement in this and was instructed to go to the ED for further evaluation. He did report his heart rate was in the 30's at times but it was in the 50's while being monitored in the ED. Hs troponin values were less than 2 and EKG showed normal sinus rhythm, heart rate 63 with PAC's and no acute ST changes. Was discharged home with plans for outpatient Cardiology follow-up.   In talking with the patient and his wife today, he reports he was very anxious the day of Emergency Department evaluation as his heart rate had been in the 30's when checked at home and he was having frequent palpitations. We reviewed that his EKG in the ED showed PAC's and this could have been contributing to his abnormal readings when checked  with a pulse oximeter. He continues to have intermittent episodes of chest pain and describes 2 different symptoms. One episode of pain occurs with food consumption or taking medications and he feels like they become stuck and he has associated discomfort at that time. He does have a swallow study scheduled for next week which was arranged by GI. The other type of discomfort can occur with activity and he reports he likes to remain active at baseline but is unable to mow his yard due to significant fatigue, shortness of breath and chest pain with this. Denies any specific orthopnea, PND or pitting edema.  Studies Reviewed:   EKG: EKG is not ordered today. EKG from 04/24/2023 is reviewed and demonstrates NSR, HR 63 with PAC's and no acute ST changes.    Cardiac Catheterization: 07/2015 Dist Cx lesion, 65% stenosed. Mid LAD to Dist LAD lesion, 20% stenosed. Prox RCA to Dist RCA lesion, 20% stenosed.   Diffuse nonobstructive atherosclerosis of all 3 coronary arteries. After intracoronary nitroglycerin, a 50-70% distal circumflex stenosis was identified. It does not appear to be hemodynamically significant. Normal left ventricular systolic function with ejection fraction of 60%. EDP is up at normal.     RECOMMENDATIONS:   Aggressive risk factor modification Smoking cessation  NST: 12/2022   Stress ECG is negative for ischemia and arrhythmias. Patient experienced chest pain during the stress test.   LV perfusion is abnormal. There is no evidence of ischemia. There is no evidence of  infarction. There is a medium sized perfusion defect with moderate reduction in uptake present in the apical to basal inferior wall (more intensified on rest images) consistent with soft tissue attenuation.   Left ventricular function is normal. Nuclear stress EF: 61 %.   Findings are consistent with no ischemia. The study is low risk.   Event Monitor: 12/2022 NSR with sinus brady (45/min) and sinus tachy (126/min)  with ave HR 56/min. Less than 1% atrial fib/flutter with a slow VR (54/min), CVR and RVR (126/min) Rare PVC's and PAC's (less than 1%) No prolonged pauses No VT or SVT   [name],MD Patch Wear Time:  12 days and 21 hours (2024-06-11T11:20:27-0400 to 2024-06-24T08:21:03-399)   Risk Assessment/Calculations:    CHA2DS2-VASc Score = 2   This indicates a 2.2% annual risk of stroke. The patient's score is based upon: CHF History: 0 HTN History: 1 Diabetes History: 0 Stroke History: 0 Vascular Disease History: 1 Age Score: 0 Gender Score: 0    STOP-Bang Score:  6        Physical Exam:   VS:  BP (!) 108/54   Pulse 64   Ht 6' (1.829 m)   Wt 169 lb (76.7 kg)   SpO2 98%   BMI 22.92 kg/m    Wt Readings from Last 3 Encounters:  04/27/23 169 lb (76.7 kg)  04/26/23 168 lb 6.4 oz (76.4 kg)  04/24/23 168 lb (76.2 kg)     GEN: Well nourished, well developed male appearing in no acute distress NECK: No JVD; No carotid bruits CARDIAC: RRR with occasional ectopic beats, no murmurs, rubs, gallops RESPIRATORY:  Clear to auscultation without rales, wheezing or rhonchi  ABDOMEN: Appears non-distended. No obvious abdominal masses. EXTREMITIES: No clubbing or cyanosis. No pitting edema.  Distal pedal pulses are 2+ bilaterally.   Assessment and Plan:   1. Chest Pain with Mixed Features/CAD - His episodes of chest pain have mixed qualities as certain episodes seem most consistent with esophageal dysmotility and he does have a swallow study scheduled (EGD in 11/2022 actually showed abnormal esophageal motility consistent with presbyesophagus). However, he describes episodes of chest pain which can occur with activity and this is more concerning for a cardiac etiology. He did have a nuclear stress test in 12/2022 which was low-risk but had soft tissue attenuation artifact at that time as well. Given his persistent symptoms and up to 65% stenosis by cath in 2017, we reviewed options and  will plan to obtain a Coronary CTA for further evaluation.  Creatinine was stable at 0.92 when checked on 04/24/2023. - Continue current medical therapy with Amlodipine 10 mg daily, Toprol-XL 12.5 mg daily and Simvastatin 20 mg daily. He is not on ASA given the need for anticoagulation.  2. Paroxysmal Atrial Fibrillation/Use of Long-term Anticoagulation - He reports occasional palpitations and we reviewed this could be due to known atrial fibrillation or PAC's and PVC's as his recent EKG showed PAC's. We also reviewed that his home pulse oximeter may not be giving him an accurate heart rate reading when he has extra beats as recent monitor in 12/2022 showed his minimum heart rate was 45 bpm with an average of 56 bpm.  Continue Toprol-XL 12.5 mg daily. Unable to further titrate given his resting heart rate. - No reports of active bleeding. Continue Xarelto 20 mg daily for anticoagulation.  3. Carotid Artery Stenosis - Prior Dopplers in 2022 showed 1 to 49% stenosis along the RICA and no significant stenosis along the LICA  and CTA Neck in 2023 showed no significant stenosis. Can plan for repeat imaging in 1 to 2 years.  - He is on statin therapy but not on ASA given the need for anticoagulation.  4. HLD - FLP in 01/2023 showed his LDL was at 54. Continue current medical therapy with Simvastatin 20 mg daily.  5. HTN - Blood pressure is well-controlled at 108/54 during today's visit. He reports this is actually somewhat higher when checked at home with SBP typically in the 120's. Continue current medical therapy with Amlodipine 10 mg daily and Toprol-XL 12.5 mg daily.  Signed, Ellsworth Lennox, PA-C

## 2023-04-27 NOTE — Telephone Encounter (Signed)
Error

## 2023-04-27 NOTE — Progress Notes (Deleted)
Cardiology Office Note    Date:  04/27/2023  ID:  BRICK KETCHER, DOB 11/27/1965, MRN [id number] Cardiologist: Nona Dell, MD   EP: Dr. Ladona Ridgel  History of Present Illness:    Richard [name] is a 57 y.o. male with past medical history of CAD (nonobstructive disease by cath in 07/2015, low-risk NST in 12/2020), paroxysmal atrial fibrillation, carotid artery disease, HTN, HLD, asthma, anxiety and depression who presents to the office today for evaluation of chest pain.  He was exam by myself in 01/2023 and reported that his episodes of chest pain had improved since his last office visit.  Stress test had been a low risk study.  He had been evaluated GI and started on baclofen to see if this would help with his symptoms.  He did see Dr. Ladona Ridgel in 03/2023 and reported the palpitations were well-controlled and he was continued on Multaq.  He did report occasional breakthrough episodes and options of ablation or Tikosyn were reviewed but he wished to continue his current medical therapy.  He called the office on 04/24/2023 reporting chest pain which is started earlier in the day with pain into his arm and shoulder and associated dyspnea.  He thought symptoms might be due to a panic attack and took Xanax but pain persisted, therefore he took nitroglycerin with improvement in this.  He did report his heart rate happened in the 30s at times but it was in the 50s while being monitored in the ED. Hs troponin values were less than 2 and EKG showed normal sinus rhythm, heart rate 63 with PAC's and no acute ST changes. Was discharged home with plans for outpatient Cardiology follow-up.   ROS: ***  Studies Reviewed:   EKG: EKG is*** ordered today and demonstrates ***   EKG Interpretation Date/Time:    Ventricular Rate:    PR Interval:    QRS Duration:    QT Interval:    QTC Calculation:   R Axis:      Text Interpretation:         Cardiac Catheterization: 07/2015 Dist Cx lesion, 65%  stenosed. Mid LAD to Dist LAD lesion, 20% stenosed. Prox RCA to Dist RCA lesion, 20% stenosed.   Diffuse nonobstructive atherosclerosis of all 3 coronary arteries. After intracoronary nitroglycerin, a 50-70% distal circumflex stenosis was identified. It does not appear to be hemodynamically significant. Normal left ventricular systolic function with ejection fraction of 60%. EDP is up at normal.     RECOMMENDATIONS:   Aggressive risk factor modification Smoking cessation  NST: 12/2022   Stress ECG is negative for ischemia and arrhythmias. Patient experienced chest pain during the stress test.   LV perfusion is abnormal. There is no evidence of ischemia. There is no evidence of infarction. There is a medium sized perfusion defect with moderate reduction in uptake present in the apical to basal inferior wall (more intensified on rest images) consistent with soft tissue attenuation.   Left ventricular function is normal. Nuclear stress EF: 61 %.   Findings are consistent with no ischemia. The study is low risk.   Event Monitor: 12/2022 NSR with sinus brady (45/min) and sinus tachy (126/min) with ave HR 56/min. Less than 1% atrial fib/flutter with a slow VR (54/min), CVR and RVR (126/min) Rare PVC's and PAC's (less than 1%) No prolonged pauses No VT or SVT   [name],MD Patch Wear Time:  12 days and 21 hours (2024-06-11T11:20:27-0400 to 2024-06-24T08:21:03-399)   Risk Assessment/Calculations:   {Does this patient  have ATRIAL FIBRILLATION?:[id number]}     STOP-Bang Score:  6  { Consider Dx Sleep Disordered Breathing or Sleep Apnea  ICD G47.33          :1}      Physical Exam:   VS:  There were no vitals taken for this visit.   Wt Readings from Last 3 Encounters:  04/26/23 168 lb 6.4 oz (76.4 kg)  04/24/23 168 lb (76.2 kg)  03/27/23 169 lb 12.8 oz (77 kg)     GEN: Well nourished, well developed in no acute distress NECK: No JVD; No carotid bruits CARDIAC: ***RRR, no  murmurs, rubs, gallops RESPIRATORY:  Clear to auscultation without rales, wheezing or rhonchi  ABDOMEN: Appears non-distended. No obvious abdominal masses. EXTREMITIES: No clubbing or cyanosis. No edema.  Distal pedal pulses are 2+ bilaterally.   Assessment and Plan:   1. Chest Pain/CAD - *** - He did undergo EGD in 11/2022 which showed abnormal esophageal motility consistent with presbyesophagus and short segment Barrett's esophagus.   2. Paroxysmal Atrial Fibrillation/Use of Long-term Anticoagulation - ***  3. Carotid Artery Stenosis -Prior Dopplers in 2022 showed 1 to 49% stenosis along the R ICA and no significant stenosis along the LICA and CTA neck in 2023 showed no significant stenosis.  Can plan for repeat imaging in 1 to 2 years.  He is on statin therapy but not on ASA given the need for anticoagulation.  4. HLD - FLP in 01/2023 showed his LDL was at 54.  Continue current medical therapy with simvastatin 20 mg daily.  5. HTN - ***  Signed, Ellsworth Lennox, PA-C

## 2023-04-30 ENCOUNTER — Ambulatory Visit (HOSPITAL_COMMUNITY)
Admission: RE | Admit: 2023-04-30 | Discharge: 2023-04-30 | Disposition: A | Discharge status: Home / Self Care | Payer: Medicaid Other | Source: Ambulatory Visit | Attending: Gastroenterology | Admitting: Gastroenterology

## 2023-04-30 DIAGNOSIS — R1310 Dysphagia, unspecified: Secondary | ICD-10-CM | POA: Insufficient documentation

## 2023-05-01 ENCOUNTER — Ambulatory Visit: Payer: Medicaid Other | Admitting: Internal Medicine

## 2023-05-08 ENCOUNTER — Telehealth (HOSPITAL_COMMUNITY): Payer: Self-pay | Admitting: *Deleted

## 2023-05-08 ENCOUNTER — Ambulatory Visit (INDEPENDENT_AMBULATORY_CARE_PROVIDER_SITE_OTHER): Payer: Medicaid Other | Admitting: Clinical

## 2023-05-08 DIAGNOSIS — F4310 Post-traumatic stress disorder, unspecified: Secondary | ICD-10-CM | POA: Diagnosis not present

## 2023-05-08 DIAGNOSIS — F4001 Agoraphobia with panic disorder: Secondary | ICD-10-CM

## 2023-05-08 DIAGNOSIS — F419 Anxiety disorder, unspecified: Secondary | ICD-10-CM

## 2023-05-08 DIAGNOSIS — F331 Major depressive disorder, recurrent, moderate: Secondary | ICD-10-CM | POA: Diagnosis not present

## 2023-05-08 NOTE — Progress Notes (Signed)
Virtual Visit via Video Note   I connected with Richard [name] on 05/08/23 at  2:00 PM EDT by a video enabled telemedicine application and verified that I am speaking with the correct person using two identifiers.   Location: Patient: home Provider: office   I discussed the limitations of evaluation and management by telemedicine and the availability of in person appointments. The patient expressed understanding and agreed to proceed.     THERAPIST PROGRESS NOTE     Session Time: 2:00 PM-2:45 PM   Participation Level: Active   Behavioral Response: Casual and Alert,Anxious   Type of Therapy: Individual Therapy   Treatment Goals addressed: Mood and Anxiety   Interventions: CBT   Summary: Richard [name] is a 57 y.o. male who presents with panic disorder/depression with anxiety/and PTSD . The OPT therapist worked with the patient for his OPT treatment. The OPT therapist utilized Motivational Interviewing to assist in creating therapeutic repore. The patient in the session was engaged and work in collaboration giving feedback about his triggers and symptoms over the past few weeks The patient spoke about his wifes ongoing recovery from surgery procedure . The patient noted with the cooler weather he has been able to be more active doing some slow. The patient has scheduled appointment for testing with his Cardiologist tomorrow. The OPT therapist utilized Cognitive Behavioral Therapy through cognitive restructuring as well as worked with the patient on coping strategies to assist in management of symptoms as well as reviewed sleep, eating habits, and general health. The patient continues to work on acceptance of limited mobility based on health conditions..The OPT therapist overviewed upcoming health appointments as listed in the patients MyChart.   Suicidal/Homicidal: Nowithout intent/plan   Therapist Response:The OPT therapist worked with the patient for the patients scheduled session. The  patient was engaged in his session and gave feedback in relation to triggers, symptoms, and behavior responses over the past few weeks. The OPT therapist worked with the patient utilizing an in session Cognitive Behavioral Therapy exercise. The patient was responsive in the session and verbalized, " I am making some progress with the cooler weather me and my wife have been walking and I have been taking my time and getting a little further with my distance in my walking".The OPT therapist worked with the patient providing ongoing psychotherapy/education and coping skills review.The patient spoke about upcoming Cardiology appointment and ongoing work to create diversity in his diet due to his health condition. The OPT therapist will continue treatment work with the patient in his next scheduled session   Plan: Return again in 2/3 weeks.   Diagnosis:      Axis I: PTSD/Panic Disorder/ Depression with anxiety  Axis II: No diagnosis      Collaboration of Care: No additional collaboration of care for this session.   Patient/Guardian was advised Release of Information must be obtained prior to any record release in order to collaborate their care with an outside provider. Patient/Guardian was advised if they have not already done so to contact the registration department to sign all necessary forms in order for [name] to release information regarding their care.    Consent: Patient/Guardian gives verbal consent for treatment and assignment of benefits for services provided during this visit. Patient/Guardian expressed understanding and agreed to proceed.    I discussed the assessment and treatment plan with the patient. The patient was provided an opportunity to ask questions and all were answered. The patient agreed with the plan and  demonstrated an understanding of the instructions.   The patient was advised to call back or seek an in-person evaluation if the symptoms worsen or if the condition fails to  improve as anticipated.   I provided 45 minutes of non-face-to-face time during this encounter.   Richard Burn, LCSW    05/08/2023

## 2023-05-08 NOTE — Telephone Encounter (Signed)
Attempted to call patient regarding upcoming cardiac CT appointment. Left message on voicemail with name and callback number Hayley Sharpe RN Navigator Cardiac Imaging [facility] [phone] Office   

## 2023-05-08 NOTE — Telephone Encounter (Signed)
Received call regarding upcoming cardiac imaging study; wife verbalizes understanding of appt date/time, parking situation and where to check in, pre-test NPO status and medications ordered, and verified current allergies; name and call back number provided for further questions should they arise Johney Frame RN Navigator Cardiac Imaging [facility] [phone] office [phone] cell  Patient has alpha gal allergy-wife reports receiving nitroglycerin in ER with no issues.

## 2023-05-09 ENCOUNTER — Ambulatory Visit (HOSPITAL_COMMUNITY)
Admission: RE | Admit: 2023-05-09 | Discharge: 2023-05-09 | Disposition: A | Discharge status: Home / Self Care | Payer: Medicaid Other | Source: Ambulatory Visit | Attending: Cardiovascular Disease | Admitting: Cardiovascular Disease

## 2023-05-09 ENCOUNTER — Encounter (HOSPITAL_COMMUNITY): Payer: Self-pay

## 2023-05-09 ENCOUNTER — Other Ambulatory Visit: Payer: Self-pay | Admitting: Cardiovascular Disease

## 2023-05-09 ENCOUNTER — Ambulatory Visit (HOSPITAL_COMMUNITY): Admit: 2023-05-09 | Payer: Medicaid Other | Admitting: Gastroenterology

## 2023-05-09 ENCOUNTER — Ambulatory Visit (HOSPITAL_COMMUNITY)
Admission: RE | Admit: 2023-05-09 | Discharge: 2023-05-09 | Disposition: A | Discharge status: Home / Self Care | Payer: Medicaid Other | Source: Ambulatory Visit | Attending: Student | Admitting: Student

## 2023-05-09 DIAGNOSIS — I2510 Atherosclerotic heart disease of native coronary artery without angina pectoris: Secondary | ICD-10-CM | POA: Diagnosis not present

## 2023-05-09 DIAGNOSIS — R079 Chest pain, unspecified: Secondary | ICD-10-CM | POA: Insufficient documentation

## 2023-05-09 DIAGNOSIS — R931 Abnormal findings on diagnostic imaging of heart and coronary circulation: Secondary | ICD-10-CM

## 2023-05-09 DIAGNOSIS — R0609 Other forms of dyspnea: Secondary | ICD-10-CM | POA: Insufficient documentation

## 2023-05-09 SURGERY — MANOMETRY, ESOPHAGUS
Anesthesia: Choice

## 2023-05-09 MED ORDER — IOHEXOL 350 MG/ML SOLN
95.0000 mL | Freq: Once | INTRAVENOUS | Status: AC | PRN
  Administered 2023-05-09: 95 mL via INTRAVENOUS

## 2023-05-09 MED ORDER — NITROGLYCERIN 0.4 MG SL SUBL
SUBLINGUAL_TABLET | SUBLINGUAL | Status: AC
  Filled 2023-05-09: qty 2

## 2023-05-09 MED ORDER — NITROGLYCERIN 0.4 MG SL SUBL
0.8000 mg | SUBLINGUAL_TABLET | Freq: Once | SUBLINGUAL | Status: AC
  Administered 2023-05-09: 0.8 mg via SUBLINGUAL

## 2023-05-10 ENCOUNTER — Other Ambulatory Visit: Payer: Self-pay | Admitting: Student

## 2023-05-10 ENCOUNTER — Telehealth: Payer: Self-pay

## 2023-05-10 MED ORDER — ISOSORBIDE MONONITRATE ER 30 MG PO TB24: 30.0000 mg | ORAL_TABLET | Freq: Every day | ORAL | 3 refills | Status: DC

## 2023-05-10 MED ORDER — ESOMEPRAZOLE MAGNESIUM 40 MG PO CPDR: 40.0000 mg | DELAYED_RELEASE_CAPSULE | Freq: Two times a day (BID) | ORAL | 5 refills | Status: DC

## 2023-05-10 NOTE — Addendum Note (Signed)
Addended by: Gelene Mink on: 05/10/2023 02:20 PM   Modules accepted: Orders

## 2023-05-10 NOTE — Telephone Encounter (Signed)
Completed.

## 2023-05-10 NOTE — Telephone Encounter (Signed)
Pt is needing a new Rx for his Nexium sent in to [facility]. Pt states that he was on 40mg  bid and 20mg  was sent in. Please make sure to put note on there that it has to be name brand. Ins has approved it.

## 2023-05-18 ENCOUNTER — Encounter (HOSPITAL_COMMUNITY): Payer: Self-pay | Admitting: Psychiatry

## 2023-05-18 ENCOUNTER — Other Ambulatory Visit: Payer: Self-pay | Admitting: *Deleted

## 2023-05-18 ENCOUNTER — Telehealth (HOSPITAL_COMMUNITY): Payer: Medicaid Other | Admitting: Psychiatry

## 2023-05-18 ENCOUNTER — Other Ambulatory Visit: Payer: Self-pay | Admitting: Gastroenterology

## 2023-05-18 DIAGNOSIS — F4001 Agoraphobia with panic disorder: Secondary | ICD-10-CM

## 2023-05-18 DIAGNOSIS — Z419 Encounter for procedure for purposes other than remedying health state, unspecified: Secondary | ICD-10-CM | POA: Diagnosis not present

## 2023-05-18 MED ORDER — MULTAQ 400 MG PO TABS: 400.0000 mg | ORAL_TABLET | Freq: Two times a day (BID) | ORAL | 3 refills | Status: DC

## 2023-05-18 MED ORDER — ALPRAZOLAM 0.5 MG PO TABS: ORAL_TABLET | ORAL | 2 refills | Status: DC

## 2023-05-18 NOTE — Progress Notes (Signed)
Virtual Visit via Video Note  I connected with Richard [name] on 05/18/23 at 11:20 AM EDT by a video enabled telemedicine application and verified that I am speaking with the correct person using two identifiers.  Location: Patient: home Provider: office   I discussed the limitations of evaluation and management by telemedicine and the availability of in person appointments. The patient expressed understanding and agreed to proceed.      I discussed the assessment and treatment plan with the patient. The patient was provided an opportunity to ask questions and all were answered. The patient agreed with the plan and demonstrated an understanding of the instructions.   The patient was advised to call back or seek an in-person evaluation if the symptoms worsen or if the condition fails to improve as anticipated.  I provided 15 minutes of non-face-to-face time during this encounter.   Diannia Ruder, MD  [facility] MD/PA/NP OP Progress Note  05/18/2023 11:33 AM Richard [name]  MRN:  [id number]  Chief Complaint:  Chief Complaint  Patient presents with   Anxiety   Follow-up   HPI: This patient is a 57 year old white male lives with his wife in [place].  He is unemployed but is recently gotten on SSI disability   Patient returns for follow-up after 3 months regarding his anxiety.  For the most part he has been doing okay.  He is trying to do more walking.  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  He is followed closely by cardiology.  To his credit he and his wife have been going out the community more and he is slowly getting more comfortable with leaving the house.  He denies significant depression or thoughts of self-harm.  He does feel like the Xanax has helped his anxiety. Visit Diagnosis:    ICD-10-CM   1. 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 Cardiac cath 07/2015 showed 65% distal Cx, 20% mid-distal LAD, 20% prox-distal RCA, EF 60%, EDP .   Colitis 1990   COPD (chronic obstructive pulmonary disease) (HCC)    Depression    Dysrhythmia    Essential hypertension    Fatty liver    Gastric ulcer 2003; 2012   2003: + esophagitis; negative H.pylori serology  2012: Dr. Darrick Penna, mild gastritis, Bravo PH probe placement, negative H.pylori   GERD (gastroesophageal reflux disease)    Hepatic steatosis    History of hiatal hernia    Hyperlipemia    Overweight    Panic attacks    Paroxysmal atrial fibrillation (HCC)    Pulmonary nodules    Stroke (HCC)    TIA (transient ischemic attack)    Type II diabetes mellitus (HCC)     Past Surgical History:  Procedure Laterality Date   BALLOON DILATION N/A 07/25/2021   Procedure: BALLOON DILATION;  Surgeon: Lanelle Bal, DO;  [facility];  Service: Endoscopy;  Laterality: N/A;   BIOPSY  07/25/2021   Procedure: BIOPSY;  Surgeon: Lanelle Bal, DO;  [facility];  Service: Endoscopy;;   BIOPSY  12/04/2022   Procedure: BIOPSY;  Surgeon: Lanelle Bal, DO;  [facility];  Service:  Endoscopy;;   BRAVO PH STUDY  05/03/2011   ZHY:QMVH gastritis/normal esophagus and duodenum   CARDIAC CATHETERIZATION  1990s X 1; 2005; 08/12/2015   CARDIAC CATHETERIZATION N/A 08/12/2015   Procedure: Left Heart Cath and Coronary Angiography;  Surgeon: Lyn Records, MD; LAD 20%, CFX 65%, RCA 20%, EF 60%    COLONOSCOPY  1990   COLONOSCOPY WITH PROPOFOL N/A 11/21/2016   Dr. Darrick Penna: non-thrombosed external hemorrhoids, one 6 mm polyp (polypoid lesion), internal hemorrhoids. TI Normal. 10 years screening   ESOPHAGOGASTRODUODENOSCOPY  05/03/2011   QIO:NGEX gastritis   ESOPHAGOGASTRODUODENOSCOPY (EGD) WITH PROPOFOL N/A 07/25/2021   Procedure:  ESOPHAGOGASTRODUODENOSCOPY (EGD) WITH PROPOFOL;  Surgeon: Lanelle Bal, DO;  [facility];  Service: Endoscopy;  Laterality: N/A;  1:30pm   ESOPHAGOGASTRODUODENOSCOPY (EGD) WITH PROPOFOL N/A 12/04/2022   Procedure: ESOPHAGOGASTRODUODENOSCOPY (EGD) WITH PROPOFOL;  Surgeon: Lanelle Bal, DO;  [facility];  Service: Endoscopy;  Laterality: N/A;  8:00AM;ASA 3   NECK MASS EXCISION Right    "done in dr's office; behind right ear/side of ncek"   POLYPECTOMY  11/21/2016   Procedure: POLYPECTOMY;  Surgeon: West Bali, MD;  [facility];  Service: Endoscopy;;  descending colon polyp   SHOULDER ARTHROSCOPY W/ ROTATOR CUFF REPAIR Right 2006   acromioclavicular joint arthrosis    Family Psychiatric History: See below  Family History:  Family History  Problem Relation Age of Onset   Lung cancer Mother    Alcohol abuse Mother    Heart attack Father 59   Diabetes Father    Alcohol abuse Father    Asthma Sister    Anxiety disorder Sister    Depression Sister    Anxiety disorder Sister    Hypertension Brother    Hypertension Brother    Heart attack Brother 67   Diabetes Brother    Hypertension Brother    Seizures Brother    Dementia Paternal Uncle    ADD / ADHD Daughter    Dementia Cousin    Colon cancer Neg Hx    Drug abuse Neg Hx    Bipolar disorder Neg Hx    OCD Neg Hx    Paranoid behavior Neg Hx    Schizophrenia Neg Hx    Sexual abuse Neg Hx    Physical abuse Neg Hx     Social History:  Social History   Socioeconomic History   Marital status: Married    Spouse name: Not on file   Number of children: Not on file   Years of education: Not on file   Highest education level: Not on file  Occupational History   Occupation: full time    Employer: UNEMPLOYED  Tobacco Use   Smoking status: Every Day    Current packs/day: 0.50    Average packs/day: 1 pack/day for 40.8 years (39.7 ttl pk-yrs)    Types: Cigarettes    Start date:  07/17/1982   Smokeless tobacco: Never   Tobacco comments:    1/2 pack a day  Vaping Use   Vaping status: Never Used  Substance and Sexual Activity   Alcohol use: Not Currently   Drug use: No   Sexual activity: Yes    Birth control/protection: None  Other Topics Concern   Not on file  Social History Narrative   Pt lives in [place] with wife.  5 children.  Unemployed due to panic attacks and back pain   Social Determinants of Health   Financial Resource Strain: Not  on file  Food Insecurity: Not on file  Transportation Needs: Not on file  Physical Activity: Not on file  Stress: Not on file  Social Connections: Not on file    Allergies:  Allergies  Allergen Reactions   Alpha-Gal Anaphylaxis   Dexilant [Dexlansoprazole] Anaphylaxis   Mushroom Ext Cmplx(Shiitake-Reishi-Mait) Anaphylaxis    Rapid heart rate.   Penicillins Anaphylaxis    Has patient had a PCN reaction causing immediate rash, facial/tongue/throat swelling, SOB or lightheadedness with hypotension: Yes Has patient had a PCN reaction causing severe rash involving mucus membranes or skin necrosis: No Has patient had a PCN reaction that required hospitalization Yes Has patient had a PCN reaction occurring within the last 10 years: No If all of the above answers are "NO", then may proceed with Cephalosporin use.    Doxycycline Nausea And Vomiting         Metabolic Disorder Labs: Lab Results  Component Value Date   HGBA1C 5.7 (H) 02/13/2023   MPG 111.15 02/16/2022   MPG 114.02 11/09/2021   No results found for: "PROLACTIN" Lab Results  Component Value Date   CHOL 107 02/13/2023   TRIG 67 02/13/2023   HDL 39 (L) 02/13/2023   CHOLHDL 2.7 02/13/2023   VLDL 8 06/13/2022   LDLCALC 54 02/13/2023   LDLCALC 58 10/20/2022   Lab Results  Component Value Date   TSH 2.300 02/13/2023   TSH 2.450 10/20/2022    Therapeutic Level Labs: No results found for: "LITHIUM" No results found for: "VALPROATE" No  results found for: "CBMZ"  Current Medications: Current Outpatient Medications  Medication Sig Dispense Refill   acetaminophen (TYLENOL) 325 MG tablet Take 650 mg by mouth every 6 (six) hours as needed for moderate pain.     albuterol (VENTOLIN HFA) 108 (90 Base) MCG/ACT inhaler INHALE 2 PUFFS BY MOUTH EVERY 6 HOURS AS NEEDED FOR COUGHING, WHEEZING, OR SHORTNESS OF BREATH 20.1 g 0   ALPRAZolam (XANAX) 0.5 MG tablet Take one twice a day and two at bedtime 120 tablet 2   amLODipine (NORVASC) 10 MG tablet Take 1 tablet by mouth once daily 90 tablet 3   azelastine (ASTELIN) 0.1 % nasal spray Place 1 spray into both nostrils 2 (two) times daily as needed. Use in each nostril as directed 30 mL 5   baclofen (LIORESAL) 10 MG tablet Take 1 tablet (10 mg total) by mouth 2 (two) times daily. 30 minutes before breakfast and dinner 60 each 1   budesonide-formoterol (SYMBICORT) 80-4.5 MCG/ACT inhaler Take 2 puffs first thing in am and then another 2 puffs about 12 hours later. 1 each 12   chlorhexidine (PERIDEX) 0.12 % solution Gargle and spit 15 mL twice daily for the next 10 days. 473 mL 0   dronedarone (MULTAQ) 400 MG tablet Take 1 tablet (400 mg total) by mouth 2 (two) times daily with a meal. 180 tablet 3   EPINEPHrine 0.3 mg/0.3 mL IJ SOAJ injection Inject 0.3 mg into the muscle as needed for anaphylaxis. 2 each 1   esomeprazole (NEXIUM) 40 MG capsule Take 1 capsule (40 mg total) by mouth 2 (two) times daily before a meal. 60 capsule 5   fluticasone (FLONASE) 50 MCG/ACT nasal spray Place 2 sprays into both nostrils daily. 16 g 5   isosorbide mononitrate (IMDUR) 30 MG 24 hr tablet Take 1 tablet (30 mg total) by mouth daily. 90 tablet 3   metoprolol succinate (TOPROL XL) 25 MG 24 hr tablet Take 0.5 tablets (12.5 mg  total) by mouth daily. 45 tablet 3   nitroGLYCERIN (NITROSTAT) 0.4 MG SL tablet Place 1 tablet (0.4 mg total) under the tongue every 5 (five) minutes x 3 doses as needed for chest pain (if no  relief after 3rd dose, proceed to the ED for an evaluation). 25 tablet 3   potassium chloride SA (KLOR-CON M) 20 MEQ tablet Take 1 tablet (20 mEq total) by mouth daily. 30 tablet 6   rivaroxaban (XARELTO) 20 MG TABS tablet TAKE 1 TABLET BY MOUTH ONCE DAILY WITH SUPPER 90 tablet 1   simvastatin (ZOCOR) 20 MG tablet Take 1 tablet by mouth once daily 90 tablet 3   sucralfate (CARAFATE) 1 GM/10ML suspension Take 10 mLs (1 g total) by mouth 4 (four) times daily -  with meals and at bedtime for 14 days. 560 mL 0   vitamin B-12 (CYANOCOBALAMIN) 50 MCG tablet Take 50 mcg by mouth daily.     VITAMIN D PO Take 1 tablet by mouth daily.     No current facility-administered medications for this visit.     Musculoskeletal: Strength & Muscle Tone: within normal limits Gait & Station: normal Patient leans: N/A  Psychiatric Specialty Exam: Review of Systems  Cardiovascular:  Positive for palpitations.  Psychiatric/Behavioral:  The patient is nervous/anxious.   All other systems reviewed and are negative.   There were no vitals taken for this visit.There is no height or weight on file to calculate BMI.  General Appearance: Casual and Fairly Groomed  Eye Contact:  Good  Speech:  Clear and Coherent  Volume:  Normal  Mood:  Anxious and Euthymic  Affect:  Congruent  Thought Process:  Goal Directed  Orientation:  Full (Time, Place, and Person)  Thought Content: WDL   Suicidal Thoughts:  No  Homicidal Thoughts:  No  Memory:  Immediate;   Good Recent;   Good Remote;   Good  Judgement:  Good  Insight:  Fair  Psychomotor Activity:  Decreased  Concentration:  Concentration: Good and Attention Span: Good  Recall:  Good  Fund of Knowledge: Good  Language: Good  Akathisia:  No  Handed:  Right  AIMS (if indicated): not done  Assets:  Communication Skills Desire for Improvement Resilience Social Support  ADL's:  Intact  Cognition: WNL  Sleep:  Fair   Screenings: GAD-7    Flowsheet Row  Office Visit from 02/12/2023 in [facility] Office Visit from 12/05/2022 in [facility] Office Visit from 11/10/2022 in [facility] Office Visit from 08/04/2022 in [facility] Counselor from 08/24/2021 in [facility] [place]  Total GAD-7 Score 9 7 13 8 20       PHQ2-9    Flowsheet Row Office Visit from 02/12/2023 in [facility] Office Visit from 12/05/2022 in [facility] Office Visit from 11/10/2022 in [facility] Office Visit from 08/04/2022 in [facility] Video Visit from 11/29/2021 in [facility] Total Score 2 2 3 4 1   PHQ-9 Total Score 6 5 10 12  --      Flowsheet Row ED from 04/24/2023 in [facility] at [facility] ED from 03/17/2023 in [facility] [place] ED from 12/08/2022 in [facility] Emergency Department at [facility]  C-SSRS RISK CATEGORY No Risk No Risk No Risk  Assessment and Plan: this patient is a 57 year old male with a history of anxiety panic attacks and agoraphobia.  He is continuing to get good benefit for his anxiety from Xanax 0.5 mg twice daily and 1 mg at bedtime so this will be continued.  He will return to see me in 3 months  Collaboration of Care: Collaboration of Care: Primary Care Provider AEB notes are shared with PCP on the epic system  Patient/Guardian was advised Release of Information must be obtained prior to any record release in order to collaborate their care with an outside provider. Patient/Guardian was advised if they have not already done so to contact the registration department to sign all necessary forms in order for [name] to release information regarding their care.   Consent: Patient/Guardian gives verbal consent for treatment  and assignment of benefits for services provided during this visit. Patient/Guardian expressed understanding and agreed to proceed.    Diannia Ruder, MD 05/18/2023, 11:33 AM

## 2023-05-21 ENCOUNTER — Encounter: Payer: Self-pay | Admitting: Family Medicine

## 2023-05-28 ENCOUNTER — Ambulatory Visit: Payer: Medicaid Other | Admitting: Nurse Practitioner

## 2023-06-01 ENCOUNTER — Other Ambulatory Visit: Payer: Self-pay

## 2023-06-01 ENCOUNTER — Encounter (HOSPITAL_COMMUNITY): Payer: Self-pay

## 2023-06-01 ENCOUNTER — Emergency Department (HOSPITAL_COMMUNITY): Payer: Medicaid Other

## 2023-06-01 ENCOUNTER — Emergency Department (HOSPITAL_COMMUNITY)
Admission: EM | Admit: 2023-06-01 | Discharge: 2023-06-02 | Disposition: A | Discharge status: Home / Self Care | Payer: Medicaid Other | Attending: Emergency Medicine | Admitting: Emergency Medicine

## 2023-06-01 ENCOUNTER — Ambulatory Visit
Admission: RE | Admit: 2023-06-01 | Discharge: 2023-06-01 | Disposition: A | Discharge status: Home / Self Care | Payer: Medicaid Other | Source: Ambulatory Visit | Attending: Family Medicine

## 2023-06-01 VITALS — BP 123/73 | HR 62 | Temp 98.1°F | Resp 18

## 2023-06-01 DIAGNOSIS — J449 Chronic obstructive pulmonary disease, unspecified: Secondary | ICD-10-CM | POA: Insufficient documentation

## 2023-06-01 DIAGNOSIS — R0789 Other chest pain: Secondary | ICD-10-CM | POA: Insufficient documentation

## 2023-06-01 DIAGNOSIS — Z7951 Long term (current) use of inhaled steroids: Secondary | ICD-10-CM | POA: Diagnosis not present

## 2023-06-01 DIAGNOSIS — E119 Type 2 diabetes mellitus without complications: Secondary | ICD-10-CM | POA: Insufficient documentation

## 2023-06-01 DIAGNOSIS — Z79899 Other long term (current) drug therapy: Secondary | ICD-10-CM | POA: Diagnosis not present

## 2023-06-01 DIAGNOSIS — I10 Essential (primary) hypertension: Secondary | ICD-10-CM | POA: Insufficient documentation

## 2023-06-01 DIAGNOSIS — I2510 Atherosclerotic heart disease of native coronary artery without angina pectoris: Secondary | ICD-10-CM | POA: Diagnosis not present

## 2023-06-01 DIAGNOSIS — J441 Chronic obstructive pulmonary disease with (acute) exacerbation: Secondary | ICD-10-CM

## 2023-06-01 DIAGNOSIS — J22 Unspecified acute lower respiratory infection: Secondary | ICD-10-CM

## 2023-06-01 DIAGNOSIS — E876 Hypokalemia: Secondary | ICD-10-CM | POA: Diagnosis not present

## 2023-06-01 DIAGNOSIS — R079 Chest pain, unspecified: Secondary | ICD-10-CM | POA: Diagnosis not present

## 2023-06-01 DIAGNOSIS — Z743 Need for continuous supervision: Secondary | ICD-10-CM | POA: Diagnosis not present

## 2023-06-01 LAB — CBC
HCT: 41.1 % (ref 39.0–52.0)
Hemoglobin: 14.4 g/dL (ref 13.0–17.0)
MCH: 31.9 pg (ref 26.0–34.0)
MCHC: 35 g/dL (ref 30.0–36.0)
MCV: 91.1 fL (ref 80.0–100.0)
Platelets: 163 10*3/uL (ref 150–400)
RBC: 4.51 MIL/uL (ref 4.22–5.81)
RDW: 12.5 % (ref 11.5–15.5)
WBC: 5.4 10*3/uL (ref 4.0–10.5)
nRBC: 0 % (ref 0.0–0.2)

## 2023-06-01 LAB — BASIC METABOLIC PANEL
Anion gap: 9 (ref 5–15)
BUN: 17 mg/dL (ref 6–20)
CO2: 22 mmol/L (ref 22–32)
Calcium: 8.5 mg/dL — ABNORMAL LOW (ref 8.9–10.3)
Chloride: 106 mmol/L (ref 98–111)
Creatinine, Ser: 0.84 mg/dL (ref 0.61–1.24)
GFR, Estimated: >60 mL/min (ref >60)
Glucose, Bld: 165 mg/dL — ABNORMAL HIGH (ref 70–99)
Potassium: 3.3 mmol/L — ABNORMAL LOW (ref 3.5–5.1)
Sodium: 137 mmol/L (ref 135–145)

## 2023-06-01 LAB — TROPONIN I (HIGH SENSITIVITY): Troponin I (High Sensitivity): <2 ng/L (ref <18)

## 2023-06-01 MED ORDER — BENZONATATE 100 MG PO CAPS: 100.0000 mg | ORAL_CAPSULE | Freq: Three times a day (TID) | ORAL | 0 refills | Status: DC

## 2023-06-01 MED ORDER — CLINDAMYCIN HCL 300 MG PO CAPS: 300.0000 mg | ORAL_CAPSULE | Freq: Two times a day (BID) | ORAL | 0 refills | Status: DC

## 2023-06-01 MED ORDER — CLINDAMYCIN PALMITATE HCL 75 MG/5ML PO SOLR: 300.0000 mg | Freq: Two times a day (BID) | ORAL | 0 refills | Status: AC

## 2023-06-01 NOTE — ED Provider Notes (Signed)
[place] EMERGENCY DEPARTMENT AT [facility] Provider Note   CSN: [id number] Arrival date & time: 06/01/23  2243     History  Chief Complaint  Patient presents with   Chest Pain    Richard [name] is a 57 y.o. male.  Patient is a 57 year old male with history of COPD, GERD, hyperlipidemia, type 2 diabetes, paroxysmal A-fib, prior TIA, hypertension, coronary artery disease.  Patient presenting today for evaluation of chest discomfort.  This started yesterday and has been occurring intermittently since.  He was seen at urgent care and diagnosed with bronchitis.  He was prescribed clindamycin and discharged.  This evening, he had what he describes as a "bad episode" where his heart began pounding in his chest and felt uncomfortable.  He denied any shortness of breath, nausea, or diaphoresis.  Symptoms have improved somewhat since arriving here.  He denies any recent exertional symptoms.  Upon reviewing his medical record, patient has had a coronary scoring CT in the past year showing lesions of the distal vessels, with medical management recommended.  He has also had a stress test in the past 6 months that was normal.  The history is provided by the patient.       Home Medications Prior to Admission medications   Medication Sig Start Date End Date Taking? Authorizing Provider  acetaminophen (TYLENOL) 325 MG tablet Take 650 mg by mouth every 6 (six) hours as needed for moderate pain.    [provider]  albuterol (VENTOLIN HFA) 108 (90 Base) MCG/ACT inhaler INHALE 2 PUFFS BY MOUTH EVERY 6 HOURS AS NEEDED FOR COUGHING, WHEEZING, OR SHORTNESS OF BREATH 06/19/22   Jacquelin Hawking, PA-C  ALPRAZolam Prudy Feeler) 0.5 MG tablet Take one twice a day and two at bedtime 05/18/23   Myrlene Broker, MD  amLODipine (NORVASC) 10 MG tablet Take 1 tablet by mouth once daily 12/07/22   Jonelle Sidle, MD  azelastine (ASTELIN) 0.1 % nasal spray Place 1 spray into both nostrils 2 (two)  times daily as needed. Use in each nostril as directed 03/26/23   Birder Robson, MD  baclofen (LIORESAL) 10 MG tablet Take 1 tablet (10 mg total) by mouth 2 (two) times daily. 30 minutes before breakfast and dinner 01/25/23   Gelene Mink, NP  benzonatate (TESSALON) 100 MG capsule Take 1 capsule (100 mg total) by mouth every 8 (eight) hours. 06/01/23   Particia Nearing, PA-C  budesonide-formoterol [facility]) 80-4.5 MCG/ACT inhaler Take 2 puffs first thing in am and then another 2 puffs about 12 hours later. 01/02/23   Nyoka Cowden, MD  chlorhexidine (PERIDEX) 0.12 % solution Gargle and spit 15 mL twice daily for the next 10 days. 03/17/23   Leath-Warren, Sadie Haber, NP  clindamycin (CLEOCIN) 300 MG capsule Take 1 capsule (300 mg total) by mouth 2 (two) times daily. 06/01/23   Particia Nearing, PA-C  clindamycin (CLEOCIN) 75 MG/5ML solution Take 20 mLs (300 mg total) by mouth 2 (two) times daily for 7 days. 06/01/23 06/08/23  Particia Nearing, PA-C  dronedarone (MULTAQ) 400 MG tablet Take 1 tablet (400 mg total) by mouth 2 (two) times daily with a meal. 05/18/23 05/18/24  Marinus Maw, MD  EPINEPHrine 0.3 mg/0.3 mL IJ SOAJ injection Inject 0.3 mg into the muscle as needed for anaphylaxis. 03/26/23   Birder Robson, MD  esomeprazole (NEXIUM) 40 MG capsule Take 1 capsule (40 mg total) by mouth 2 (two) times daily before a meal.  05/10/23   Gelene Mink, NP  fluticasone (FLONASE) 50 MCG/ACT nasal spray Place 2 sprays into both nostrils daily. 03/26/23   Birder Robson, MD  isosorbide mononitrate (IMDUR) 30 MG 24 hr tablet Take 1 tablet (30 mg total) by mouth daily. 05/10/23   Strader, Lennart Pall, PA-C  metoprolol succinate (TOPROL XL) 25 MG 24 hr tablet Take 0.5 tablets (12.5 mg total) by mouth daily. 07/28/22   Jonelle Sidle, MD  nitroGLYCERIN (NITROSTAT) 0.4 MG SL tablet Place 1 tablet (0.4 mg total) under the tongue every 5 (five) minutes x 3 doses as needed for chest pain (if no  relief after 3rd dose, proceed to the ED for an evaluation). 07/26/22   Marinus Maw, MD  potassium chloride SA (KLOR-CON M) 20 MEQ tablet Take 1 tablet (20 mEq total) by mouth daily. 12/15/22   Dunn, Tacey Ruiz, PA-C  rivaroxaban (XARELTO) 20 MG TABS tablet TAKE 1 TABLET BY MOUTH ONCE DAILY WITH SUPPER 03/12/23   Jonelle Sidle, MD  simvastatin (ZOCOR) 20 MG tablet Take 1 tablet by mouth once daily 07/14/22   Marinus Maw, MD  sucralfate (CARAFATE) 1 GM/10ML suspension Take 10 mLs (1 g total) by mouth 4 (four) times daily -  with meals and at bedtime for 14 days. 12/25/22 04/26/23  Gelene Mink, NP  vitamin B-12 (CYANOCOBALAMIN) 50 MCG tablet Take 50 mcg by mouth daily.    [provider]  VITAMIN D PO Take 1 tablet by mouth daily.    [provider]      Allergies    Alpha-gal, Dexilant [dexlansoprazole], Mushroom ext cmplx(shiitake-reishi-mait), Penicillins, and Doxycycline    Review of Systems   Review of Systems  All other systems reviewed and are negative.   Physical Exam Updated Vital Signs BP 125/83 (BP Location: Right Arm)   Pulse 65   Temp 98.1 F (36.7 C) (Oral)   Resp 20   Ht 6' (1.829 m)   Wt 76.4 kg   SpO2 98%   BMI 22.84 kg/m  Physical Exam Vitals and nursing note reviewed.  Constitutional:      General: He is not in acute distress.    Appearance: He is well-developed. He is not diaphoretic.  HENT:     Head: Normocephalic and atraumatic.  Cardiovascular:     Rate and Rhythm: Normal rate and regular rhythm.     Heart sounds: No murmur heard.    No friction rub.  Pulmonary:     Effort: Pulmonary effort is normal. No respiratory distress.     Breath sounds: Normal breath sounds. No wheezing or rales.  Abdominal:     General: Bowel sounds are normal. There is no distension.     Palpations: Abdomen is soft.     Tenderness: There is no abdominal tenderness.  Musculoskeletal:        General: Normal range of motion.     Cervical back:  Normal range of motion and neck supple.     Right lower leg: No tenderness. No edema.     Left lower leg: No tenderness. No edema.  Skin:    General: Skin is warm and dry.  Neurological:     Mental Status: He is alert and oriented to person, place, and time.     Coordination: Coordination normal.     ED Results / Procedures / Treatments   Labs (all labs ordered are listed, but only abnormal results are displayed) Labs Reviewed  BASIC METABOLIC PANEL  CBC  TROPONIN I (HIGH SENSITIVITY)    EKG EKG Interpretation Date/Time:  Friday June 01 2023 22:56:16 EST Ventricular Rate:  75 PR Interval:    QRS Duration:  105 QT Interval:  418 QTC Calculation: 467 R Axis:   78  Text Interpretation: Atrial fibrillation Borderline repolarization abnormality No significant change since 04/24/2023 Confirmed by Geoffery Lyons ([id number]) on 06/01/2023 11:05:28 PM  Radiology No results found.  Procedures Procedures  {Document cardiac monitor, telemetry assessment procedure when appropriate:1}  Medications Ordered in ED Medications - No data to display  ED Course/ Medical Decision Making/ A&P   {   Click here for ABCD2, HEART and other calculatorsREFRESH Note before signing :1}                              Medical Decision Making Amount and/or Complexity of Data Reviewed Labs: ordered. Radiology: ordered.   ***  {Document critical care time when appropriate:1} {Document review of labs and clinical decision tools ie heart score, Chads2Vasc2 etc:1}  {Document your independent review of radiology images, and any outside records:1} {Document your discussion with family members, caretakers, and with consultants:1} {Document social determinants of health affecting pt's care:1} {Document your decision making why or why not admission, treatments were needed:1} Final Clinical Impression(s) / ED Diagnoses Final diagnoses:  None    Rx / DC Orders ED Discharge Orders     None

## 2023-06-01 NOTE — ED Triage Notes (Signed)
Pt to ED via [facility] cc chest pain for 3 days. Hx COPD and afib. Was seen at urgent care today and given an antibiotic for "respiratory infection". Pt states it feels like pressure in the center of his chest, radiating to spine. Called EMS because upon standing, he felt like he was going to pass out just prior to calling.

## 2023-06-01 NOTE — ED Notes (Signed)
Took 4 baby asa pta with relief

## 2023-06-01 NOTE — ED Provider Notes (Signed)
[facility]    CSN: [id number] Arrival date & time: 06/01/23  1244      History   Chief Complaint Chief Complaint  Patient presents with   Cough    Sinus  and chest congestion and cough - Entered by patient    HPI Richard [name] is a 57 y.o. male.   Patient presenting today with 1.5 weeks of progressively worsening nasal congestion, facial pain and pressure, chest congestion, shortness of breath, bilateral ear muffled sensation.  Denies chest pain, abdominal pain, nausea vomiting or diarrhea.  History of asthma and COPD on Symbicort, albuterol which he states is no longer helping much.  History of pneumonia.  On chronic anticoagulation for atrial fibrillation.    Past Medical History:  Diagnosis Date   Arthritis    Asthma    CAD (coronary artery disease)    a. Cardiac cath 07/2015 showed 65% distal Cx, 20% mid-distal LAD, 20% prox-distal RCA, EF 60%, EDP .   Colitis 1990   COPD (chronic obstructive pulmonary disease) (HCC)    Depression    Dysrhythmia    Essential hypertension    Fatty liver    Gastric ulcer 2003; 2012   2003: + esophagitis; negative H.pylori serology  2012: Dr. Darrick Penna, mild gastritis, Bravo PH probe placement, negative H.pylori   GERD (gastroesophageal reflux disease)    Hepatic steatosis    History of hiatal hernia    Hyperlipemia    Overweight    Panic attacks    Paroxysmal atrial fibrillation (HCC)    Pulmonary nodules    Stroke [facility])    TIA (transient ischemic attack)    Type II diabetes mellitus (HCC)     Patient Active Problem List   Diagnosis Date Noted   Fatigue 02/16/2023   Delayed wound healing 12/05/2022   Prostate cancer screening 11/10/2022   Arm pain, left 08/04/2022   Upper airway cough syndrome vs cough variant asthma 03/14/2022   Abnormal chest CT 11/16/2021   Pulmonary nodules 11/16/2021   RLQ abdominal pain 08/14/2021   Abnormal weight loss 08/14/2021   Acute ischemic stroke (HCC) 08/03/2021    Hypokalemia 08/03/2021   Hyperglycemia due to diabetes mellitus (HCC) 08/03/2021   Atrial fibrillation, chronic (HCC) 08/03/2021   Secondary hypercoagulable state (HCC) 04/27/2021   Paroxysmal atrial fibrillation (HCC)    Lateral epicondylitis, left elbow 03/27/2019   Globus sensation 02/21/2018   Other cervical disc degeneration, unspecified cervical region 01/11/2018   Tendinitis of right triceps 01/11/2018   Neck pain 12/27/2017   Lateral epicondylitis, right elbow 05/10/2017   Constipation 12/24/2015   DOE (dyspnea on exertion) 11/17/2015   Coronary artery disease due to lipid rich plaque    Heart palpitations 08/12/2015   TIA (transient ischemic attack) 08/12/2015   Colon cancer screening 08/02/2015   Abdominal pain 12/18/2014   Encounter for screening colonoscopy 12/18/2014   Vitamin D deficiency 08/20/2012   Arteriosclerotic cardiovascular disease (ASCVD) 04/11/2012   Dyspepsia 11/29/2011   Chronic low back pain    Dysphagia 05/09/2011   Essential hypertension    Anxiety and depression    Controlled diabetes mellitus type 2 with complications (HCC) 10/03/2010   Cigarette smoker 09/14/2010   COPD (chronic obstructive pulmonary disease) (HCC) 09/14/2010   Mixed hyperlipidemia 11/25/2009   GERD (gastroesophageal reflux disease) 04/05/2009   Hepatic steatosis 04/05/2009    Past Surgical History:  Procedure Laterality Date   BALLOON DILATION N/A 07/25/2021   Procedure: BALLOON DILATION;  Surgeon: Lanelle Bal, DO;  [facility];  Service: Endoscopy;  Laterality: N/A;   BIOPSY  07/25/2021   Procedure: BIOPSY;  Surgeon: Lanelle Bal, DO;  Location: [facility];  Service: Endoscopy;;   BIOPSY  12/04/2022   Procedure: BIOPSY;  Surgeon: Lanelle Bal, DO;  Location: [facility];  Service: Endoscopy;;   BRAVO PH STUDY  05/03/2011   JYN:WGNF gastritis/normal esophagus and duodenum   CARDIAC CATHETERIZATION  1990s X 1; 2005; 08/12/2015   CARDIAC  CATHETERIZATION N/A 08/12/2015   Procedure: Left Heart Cath and Coronary Angiography;  Surgeon: Lyn Records, MD; LAD 20%, CFX 65%, RCA 20%, EF 60%    COLONOSCOPY  1990   COLONOSCOPY WITH PROPOFOL N/A 11/21/2016   Dr. Darrick Penna: non-thrombosed external hemorrhoids, one 6 mm polyp (polypoid lesion), internal hemorrhoids. TI Normal. 10 years screening   ESOPHAGOGASTRODUODENOSCOPY  05/03/2011   AOZ:HYQM gastritis   ESOPHAGOGASTRODUODENOSCOPY (EGD) WITH PROPOFOL N/A 07/25/2021   Procedure: ESOPHAGOGASTRODUODENOSCOPY (EGD) WITH PROPOFOL;  Surgeon: Lanelle Bal, DO;  [facility];  Service: Endoscopy;  Laterality: N/A;  1:30pm   ESOPHAGOGASTRODUODENOSCOPY (EGD) WITH PROPOFOL N/A 12/04/2022   Procedure: ESOPHAGOGASTRODUODENOSCOPY (EGD) WITH PROPOFOL;  Surgeon: Lanelle Bal, DO;  Location: [facility];  Service: Endoscopy;  Laterality: N/A;  8:00AM;ASA 3   NECK MASS EXCISION Right    "done in dr's office; behind right ear/side of ncek"   POLYPECTOMY  11/21/2016   Procedure: POLYPECTOMY;  Surgeon: West Bali, MD;  Location: [facility];  Service: Endoscopy;;  descending colon polyp   SHOULDER ARTHROSCOPY W/ ROTATOR CUFF REPAIR Right 2006   acromioclavicular joint arthrosis       Home Medications    Prior to Admission medications   Medication Sig Start Date End Date Taking? Authorizing Provider  benzonatate (TESSALON) 100 MG capsule Take 1 capsule (100 mg total) by mouth every 8 (eight) hours. 06/01/23  Yes Particia Nearing, PA-C  clindamycin (CLEOCIN) 300 MG capsule Take 1 capsule (300 mg total) by mouth 2 (two) times daily. 06/01/23  Yes Particia Nearing, PA-C  acetaminophen (TYLENOL) 325 MG tablet Take 650 mg by mouth every 6 (six) hours as needed for moderate pain.    [provider]  albuterol (VENTOLIN HFA) 108 (90 Base) MCG/ACT inhaler INHALE 2 PUFFS BY MOUTH EVERY 6 HOURS AS NEEDED FOR COUGHING, WHEEZING, OR SHORTNESS OF BREATH 06/19/22   Jacquelin Hawking, PA-C  ALPRAZolam Prudy Feeler) 0.5 MG tablet Take one twice a day and two at bedtime 05/18/23   Myrlene Broker, MD  amLODipine (NORVASC) 10 MG tablet Take 1 tablet by mouth once daily 12/07/22   Jonelle Sidle, MD  azelastine (ASTELIN) 0.1 % nasal spray Place 1 spray into both nostrils 2 (two) times daily as needed. Use in each nostril as directed 03/26/23   Birder Robson, MD  baclofen (LIORESAL) 10 MG tablet Take 1 tablet (10 mg total) by mouth 2 (two) times daily. 30 minutes before breakfast and dinner 01/25/23   Gelene Mink, NP  budesonide-formoterol [facility]) 80-4.5 MCG/ACT inhaler Take 2 puffs first thing in am and then another 2 puffs about 12 hours later. 01/02/23   Nyoka Cowden, MD  chlorhexidine (PERIDEX) 0.12 % solution Gargle and spit 15 mL twice daily for the next 10 days. 03/17/23   Leath-Warren, Sadie Haber, NP  dronedarone (MULTAQ) 400 MG tablet Take 1 tablet (400 mg total) by mouth 2 (two) times daily with a meal. 05/18/23 05/18/24  Ladona Ridgel,  Doylene Canning, MD  EPINEPHrine 0.3 mg/0.3 mL IJ SOAJ injection Inject 0.3 mg into the muscle as needed for anaphylaxis. 03/26/23   Birder Robson, MD  esomeprazole (NEXIUM) 40 MG capsule Take 1 capsule (40 mg total) by mouth 2 (two) times daily before a meal. 05/10/23   Gelene Mink, NP  fluticasone [facility]) 50 MCG/ACT nasal spray Place 2 sprays into both nostrils daily. 03/26/23   Birder Robson, MD  isosorbide mononitrate (IMDUR) 30 MG 24 hr tablet Take 1 tablet (30 mg total) by mouth daily. 05/10/23   Strader, Lennart Pall, PA-C  metoprolol succinate (TOPROL XL) 25 MG 24 hr tablet Take 0.5 tablets (12.5 mg total) by mouth daily. 07/28/22   Jonelle Sidle, MD  nitroGLYCERIN (NITROSTAT) 0.4 MG SL tablet Place 1 tablet (0.4 mg total) under the tongue every 5 (five) minutes x 3 doses as needed for chest pain (if no relief after 3rd dose, proceed to the ED for an evaluation). 07/26/22   Marinus Maw, MD  potassium chloride SA (KLOR-CON M) 20 MEQ  tablet Take 1 tablet (20 mEq total) by mouth daily. 12/15/22   Dunn, Tacey Ruiz, PA-C  rivaroxaban (XARELTO) 20 MG TABS tablet TAKE 1 TABLET BY MOUTH ONCE DAILY WITH SUPPER 03/12/23   Jonelle Sidle, MD  simvastatin (ZOCOR) 20 MG tablet Take 1 tablet by mouth once daily 07/14/22   Marinus Maw, MD  sucralfate (CARAFATE) 1 GM/10ML suspension Take 10 mLs (1 g total) by mouth 4 (four) times daily -  with meals and at bedtime for 14 days. 12/25/22 04/26/23  Gelene Mink, NP  vitamin B-12 (CYANOCOBALAMIN) 50 MCG tablet Take 50 mcg by mouth daily.    [provider]  VITAMIN D PO Take 1 tablet by mouth daily.    [provider]    Family History Family History  Problem Relation Age of Onset   Lung cancer Mother    Alcohol abuse Mother    Heart attack Father 60   Diabetes Father    Alcohol abuse Father    Asthma Sister    Anxiety disorder Sister    Depression Sister    Anxiety disorder Sister    Hypertension Brother    Hypertension Brother    Heart attack Brother 30   Diabetes Brother    Hypertension Brother    Seizures Brother    Dementia Paternal Uncle    ADD / ADHD Daughter    Dementia Cousin    Colon cancer Neg Hx    Drug abuse Neg Hx    Bipolar disorder Neg Hx    OCD Neg Hx    Paranoid behavior Neg Hx    Schizophrenia Neg Hx    Sexual abuse Neg Hx    Physical abuse Neg Hx     Social History Social History   Tobacco Use   Smoking status: Every Day    Current packs/day: 0.50    Average packs/day: 1 pack/day for 40.9 years (39.7 ttl pk-yrs)    Types: Cigarettes    Start date: 07/17/1982   Smokeless tobacco: Never   Tobacco comments:    1/2 pack a day  Vaping Use   Vaping status: Never Used  Substance Use Topics   Alcohol use: Not Currently   Drug use: No     Allergies   Alpha-gal, Dexilant [dexlansoprazole], Mushroom ext cmplx(shiitake-reishi-mait), Penicillins, and Doxycycline   Review of Systems Review of Systems PER HPI  Physical  Exam Triage  Vital Signs ED Triage Vitals [06/01/23 1258]  Encounter Vitals Group     BP 123/73     Systolic BP Percentile      Diastolic BP Percentile      Pulse Rate 62     Resp 18     Temp 98.1 F (36.7 C)     Temp Source Oral     SpO2 96 %     Weight      Height      Head Circumference      Peak Flow      Pain Score 2     Pain Loc      Pain Education      Exclude from Growth Chart    No data found.  Updated Vital Signs BP 123/73 (BP Location: Right Arm)   Pulse 62   Temp 98.1 F (36.7 C) (Oral)   Resp 18   SpO2 96%   Visual Acuity Right Eye Distance:   Left Eye Distance:   Bilateral Distance:    Right Eye Near:   Left Eye Near:    Bilateral Near:     Physical Exam Vitals and nursing note reviewed.  Constitutional:      Appearance: He is well-developed.  HENT:     Head: Atraumatic.     Right Ear: External ear normal.     Left Ear: External ear normal.     Nose: Congestion present.     Mouth/Throat:     Mouth: Mucous membranes are moist.     Pharynx: Posterior oropharyngeal erythema present. No oropharyngeal exudate.  Eyes:     Conjunctiva/sclera: Conjunctivae normal.     Pupils: Pupils are equal, round, and reactive to light.  Cardiovascular:     Rate and Rhythm: Normal rate and regular rhythm.  Pulmonary:     Effort: Pulmonary effort is normal. No respiratory distress.     Breath sounds: No wheezing or rales.  Musculoskeletal:        General: Normal range of motion.     Cervical back: Normal range of motion and neck supple.  Lymphadenopathy:     Cervical: No cervical adenopathy.  Skin:    General: Skin is warm and dry.  Neurological:     Mental Status: He is alert and oriented to person, place, and time.     Motor: No weakness.     Gait: Gait normal.  Psychiatric:        Behavior: Behavior normal.      UC Treatments / Results  Labs (all labs ordered are listed, but only abnormal results are displayed) Labs Reviewed - No data to  display  EKG   Radiology No results found.  Procedures Procedures (including critical care time)  Medications Ordered in UC Medications - No data to display  Initial Impression / Assessment and Plan / UC Course  I have reviewed the triage vital signs and the nursing notes.  Pertinent labs & imaging results that were available during my care of the patient were reviewed by me and considered in my medical decision making (see chart for details).     Given his medication allergies and chronic medication contraindications, we will treat for COPD exacerbation/lower respiratory infection with clindamycin.  Tessalon Perles, over-the-counter cold and congestion medications and supportive home care reviewed.  Return for any worsening symptoms.  Final Clinical Impressions(s) / UC Diagnoses   Final diagnoses:  COPD exacerbation (HCC)  Lower respiratory infection     Discharge Instructions  Take the course of antibiotics and the cough Perles.  Use your Symbicort consistently and albuterol every 4 hours as needed.  Mucinex twice daily, Coricidin HBP, fluids, humidifiers    ED Prescriptions     Medication Sig Dispense Auth. Provider   benzonatate (TESSALON) 100 MG capsule Take 1 capsule (100 mg total) by mouth every 8 (eight) hours. 21 capsule Particia Nearing, [place]   clindamycin (CLEOCIN) 300 MG capsule Take 1 capsule (300 mg total) by mouth 2 (two) times daily. 14 capsule Particia Nearing, [place]      PDMP not reviewed this encounter.   Particia Nearing, [place] 06/01/23 1326

## 2023-06-01 NOTE — Discharge Instructions (Signed)
Take the course of antibiotics and the cough Perles.  Use your Symbicort consistently and albuterol every 4 hours as needed.  Mucinex twice daily, Coricidin HBP, fluids, humidifiers

## 2023-06-01 NOTE — ED Triage Notes (Signed)
Pt reports sinus pressure, chest congestion hurts to breath in , bilateral ear muffled x 1.5 weeks

## 2023-06-02 LAB — TROPONIN I (HIGH SENSITIVITY): Troponin I (High Sensitivity): 2 ng/L (ref <18)

## 2023-06-02 MED ORDER — POTASSIUM CHLORIDE CRYS ER 20 MEQ PO TBCR
40.0000 meq | EXTENDED_RELEASE_TABLET | Freq: Once | ORAL | Status: AC
  Administered 2023-06-02: 40 meq via ORAL
  Filled 2023-06-02: qty 2

## 2023-06-02 NOTE — Discharge Instructions (Signed)
Continue medications as previously prescribed.  Follow-up with your primary doctor/cardiologist next week, and return to the ER if symptoms significantly worsen or change.

## 2023-06-04 ENCOUNTER — Telehealth: Payer: Self-pay | Admitting: Cardiology

## 2023-06-04 DIAGNOSIS — I480 Paroxysmal atrial fibrillation: Secondary | ICD-10-CM

## 2023-06-04 DIAGNOSIS — Z79899 Other long term (current) drug therapy: Secondary | ICD-10-CM

## 2023-06-04 NOTE — Telephone Encounter (Signed)
Wife informed and confirmed that patient is taking potassium 20 meq daily. Will have lab work at [facility].

## 2023-06-04 NOTE — Telephone Encounter (Signed)
Pt went to ER Friday night at [facility]. His potassium was low. Wife wants to know if it needs to be checked again. Please advise

## 2023-06-08 DIAGNOSIS — Z79899 Other long term (current) drug therapy: Secondary | ICD-10-CM | POA: Diagnosis not present

## 2023-06-08 DIAGNOSIS — I480 Paroxysmal atrial fibrillation: Secondary | ICD-10-CM | POA: Diagnosis not present

## 2023-06-09 LAB — BASIC METABOLIC PANEL
BUN/Creatinine Ratio: 17 (ref 9–20)
BUN: 15 mg/dL (ref 6–24)
CO2: 25 mmol/L (ref 20–29)
Calcium: 9.1 mg/dL (ref 8.7–10.2)
Chloride: 102 mmol/L (ref 96–106)
Creatinine, Ser: 0.88 mg/dL (ref 0.76–1.27)
Glucose: 96 mg/dL (ref 70–99)
Potassium: 3.9 mmol/L (ref 3.5–5.2)
Sodium: 141 mmol/L (ref 134–144)
eGFR: 100 mL/min/{1.73_m2} (ref >59)

## 2023-06-13 ENCOUNTER — Telehealth: Payer: Self-pay | Admitting: Cardiology

## 2023-06-13 NOTE — Telephone Encounter (Signed)
Wife states patient is afraid to take imdur and asked if should take it. I encouraged them to take medication as prescribed,they agreed to do so.

## 2023-06-13 NOTE — Telephone Encounter (Signed)
Pt's wife states the pt wants to know if he has to take the new medication he was given or if he can just stay not taking it. Please advise

## 2023-06-17 DIAGNOSIS — Z419 Encounter for procedure for purposes other than remedying health state, unspecified: Secondary | ICD-10-CM | POA: Diagnosis not present

## 2023-06-18 ENCOUNTER — Encounter: Payer: Self-pay | Admitting: Family Medicine

## 2023-06-18 ENCOUNTER — Ambulatory Visit (INDEPENDENT_AMBULATORY_CARE_PROVIDER_SITE_OTHER): Payer: Medicaid Other | Admitting: Family Medicine

## 2023-06-18 VITALS — BP 128/74 | HR 61 | Ht 72.0 in | Wt 172.1 lb

## 2023-06-18 DIAGNOSIS — E038 Other specified hypothyroidism: Secondary | ICD-10-CM | POA: Diagnosis not present

## 2023-06-18 DIAGNOSIS — R5383 Other fatigue: Secondary | ICD-10-CM | POA: Diagnosis not present

## 2023-06-18 DIAGNOSIS — E782 Mixed hyperlipidemia: Secondary | ICD-10-CM

## 2023-06-18 DIAGNOSIS — R7303 Prediabetes: Secondary | ICD-10-CM

## 2023-06-18 DIAGNOSIS — E559 Vitamin D deficiency, unspecified: Secondary | ICD-10-CM

## 2023-06-18 DIAGNOSIS — L309 Dermatitis, unspecified: Secondary | ICD-10-CM | POA: Diagnosis not present

## 2023-06-18 DIAGNOSIS — I10 Essential (primary) hypertension: Secondary | ICD-10-CM | POA: Diagnosis not present

## 2023-06-18 MED ORDER — TRIAMCINOLONE ACETONIDE 0.1 % EX CREA: 1.0000 | TOPICAL_CREAM | Freq: Two times a day (BID) | CUTANEOUS | 2 refills | Status: DC

## 2023-06-18 NOTE — Progress Notes (Unsigned)
Established Patient Office Visit  Subjective:  Patient ID: Richard [name], male    DOB: [birth year]  Age: 57 y.o. MRN: [id number]  CC:  Chief Complaint  Patient presents with   Care Management    4 month f/u   Skin Problem    Pt reports skin issue on the bottom of his right foot onset about a 1 week ago, also has skin concerns on his back noticed this about a month ago.     HPI Richard [name] is a 57 y.o. male with past medical history of hypertension, hyperlipidemia presents for f/u of  chronic medical conditions. For the details of today's visit, please refer to the assessment and plan.     Past Medical History:  Diagnosis Date   Arthritis    Asthma    CAD (coronary artery disease)    a. Cardiac cath 07/2015 showed 65% distal Cx, 20% mid-distal LAD, 20% prox-distal RCA, EF 60%, EDP .   Colitis 1990   COPD (chronic obstructive pulmonary disease) (HCC)    Depression    Dysrhythmia    Essential hypertension    Fatty liver    Gastric ulcer 2003; 2012   2003: + esophagitis; negative H.pylori serology  2012: Dr. Darrick Penna, mild gastritis, Bravo PH probe placement, negative H.pylori   GERD (gastroesophageal reflux disease)    Hepatic steatosis    History of hiatal hernia    Hyperlipemia    Overweight    Panic attacks    Paroxysmal atrial fibrillation (HCC)    Pulmonary nodules    Stroke (HCC)    TIA (transient ischemic attack)    Type II diabetes mellitus (HCC)     Past Surgical History:  Procedure Laterality Date   BALLOON DILATION N/A 07/25/2021   Procedure: BALLOON DILATION;  Surgeon: Lanelle Bal, DO;  [facility];  Service: Endoscopy;  Laterality: N/A;   BIOPSY  07/25/2021   Procedure: BIOPSY;  Surgeon: Lanelle Bal, DO;  [facility]: [facility];  Service: Endoscopy;;   BIOPSY  12/04/2022   Procedure: BIOPSY;  Surgeon: Lanelle Bal, DO;  [facility]: [facility];  Service: Endoscopy;;   BRAVO PH STUDY  05/03/2011   NWG:NFAO  gastritis/normal esophagus and duodenum   CARDIAC CATHETERIZATION  1990s X 1; 2005; 08/12/2015   CARDIAC CATHETERIZATION N/A 08/12/2015   Procedure: Left Heart Cath and Coronary Angiography;  Surgeon: Lyn Records, MD; LAD 20%, CFX 65%, RCA 20%, EF 60%    COLONOSCOPY  1990   COLONOSCOPY WITH PROPOFOL N/A 11/21/2016   Dr. Darrick Penna: non-thrombosed external hemorrhoids, one 6 mm polyp (polypoid lesion), internal hemorrhoids. TI Normal. 10 years screening   ESOPHAGOGASTRODUODENOSCOPY  05/03/2011   ZHY:QMVH gastritis   ESOPHAGOGASTRODUODENOSCOPY (EGD) WITH PROPOFOL N/A 07/25/2021   Procedure: ESOPHAGOGASTRODUODENOSCOPY (EGD) WITH PROPOFOL;  Surgeon: Lanelle Bal, DO;  [facility]: [facility];  Service: Endoscopy;  Laterality: N/A;  1:30pm   ESOPHAGOGASTRODUODENOSCOPY (EGD) WITH PROPOFOL N/A 12/04/2022   Procedure: ESOPHAGOGASTRODUODENOSCOPY (EGD) WITH PROPOFOL;  Surgeon: Lanelle Bal, DO;  [facility]: [facility];  Service: Endoscopy;  Laterality: N/A;  8:00AM;ASA 3   NECK MASS EXCISION Right    "done in dr's office; behind right ear/side of ncek"   POLYPECTOMY  11/21/2016   Procedure: POLYPECTOMY;  Surgeon: West Bali, MD;  Location: [facility];  Service: Endoscopy;;  descending colon polyp   SHOULDER ARTHROSCOPY W/ ROTATOR CUFF REPAIR Right 2006   acromioclavicular joint arthrosis  Family History  Problem Relation Age of Onset   Lung cancer Mother    Alcohol abuse Mother    Heart attack Father 5   Diabetes Father    Alcohol abuse Father    Asthma Sister    Anxiety disorder Sister    Depression Sister    Anxiety disorder Sister    Hypertension Brother    Hypertension Brother    Heart attack Brother 29   Diabetes Brother    Hypertension Brother    Seizures Brother    Dementia Paternal Uncle    ADD / ADHD Daughter    Dementia Cousin    Colon cancer Neg Hx    Drug abuse Neg Hx    Bipolar disorder Neg Hx    OCD Neg Hx    Paranoid behavior Neg Hx    Schizophrenia Neg  Hx    Sexual abuse Neg Hx    Physical abuse Neg Hx     Social History   Socioeconomic History   Marital status: Married    Spouse name: Not on file   Number of children: Not on file   Years of education: Not on file   Highest education level: Not on file  Occupational History   Occupation: full time    Employer: UNEMPLOYED  Tobacco Use   Smoking status: Every Day    Current packs/day: 0.50    Average packs/day: 1 pack/day for 40.9 years (39.7 ttl pk-yrs)    Types: Cigarettes    Start date: 07/17/1982   Smokeless tobacco: Never   Tobacco comments:    1/2 pack a day  Vaping Use   Vaping status: Never Used  Substance and Sexual Activity   Alcohol use: Not Currently   Drug use: No   Sexual activity: Yes    Birth control/protection: None  Other Topics Concern   Not on file  Social History Narrative   Pt lives in [place] with wife.  5 children.  Unemployed due to panic attacks and back pain   Social Determinants of Health   Financial Resource Strain: Not on file  Food Insecurity: Not on file  Transportation Needs: Not on file  Physical Activity: Not on file  Stress: Not on file  Social Connections: Not on file  Intimate Partner Violence: Not on file    Outpatient Medications Prior to Visit  Medication Sig Dispense Refill   acetaminophen (TYLENOL) 325 MG tablet Take 650 mg by mouth every 6 (six) hours as needed for moderate pain.     albuterol (VENTOLIN HFA) 108 (90 Base) MCG/ACT inhaler INHALE 2 PUFFS BY MOUTH EVERY 6 HOURS AS NEEDED FOR COUGHING, WHEEZING, OR SHORTNESS OF BREATH 20.1 g 0   ALPRAZolam (XANAX) 0.5 MG tablet Take one twice a day and two at bedtime 120 tablet 2   amLODipine (NORVASC) 10 MG tablet Take 1 tablet by mouth once daily 90 tablet 3   azelastine (ASTELIN) 0.1 % nasal spray Place 1 spray into both nostrils 2 (two) times daily as needed. Use in each nostril as directed 30 mL 5   baclofen (LIORESAL) 10 MG tablet Take 1 tablet (10 mg total) by  mouth 2 (two) times daily. 30 minutes before breakfast and dinner 60 each 1   benzonatate (TESSALON) 100 MG capsule Take 1 capsule (100 mg total) by mouth every 8 (eight) hours. 21 capsule 0   budesonide-formoterol (SYMBICORT) 80-4.5 MCG/ACT inhaler Take 2 puffs first thing in am and then another 2 puffs about 12  hours later. 1 each 12   chlorhexidine (PERIDEX) 0.12 % solution Gargle and spit 15 mL twice daily for the next 10 days. 473 mL 0   clindamycin (CLEOCIN) 300 MG capsule Take 1 capsule (300 mg total) by mouth 2 (two) times daily. 14 capsule 0   dronedarone (MULTAQ) 400 MG tablet Take 1 tablet (400 mg total) by mouth 2 (two) times daily with a meal. 180 tablet 3   EPINEPHrine 0.3 mg/0.3 mL IJ SOAJ injection Inject 0.3 mg into the muscle as needed for anaphylaxis. 2 each 1   esomeprazole (NEXIUM) 40 MG capsule Take 1 capsule (40 mg total) by mouth 2 (two) times daily before a meal. 60 capsule 5   fluticasone (FLONASE) 50 MCG/ACT nasal spray Place 2 sprays into both nostrils daily. 16 g 5   isosorbide mononitrate (IMDUR) 30 MG 24 hr tablet Take 1 tablet (30 mg total) by mouth daily. 90 tablet 3   metoprolol succinate (TOPROL XL) 25 MG 24 hr tablet Take 0.5 tablets (12.5 mg total) by mouth daily. 45 tablet 3   nitroGLYCERIN (NITROSTAT) 0.4 MG SL tablet Place 1 tablet (0.4 mg total) under the tongue every 5 (five) minutes x 3 doses as needed for chest pain (if no relief after 3rd dose, proceed to the ED for an evaluation). 25 tablet 3   potassium chloride SA (KLOR-CON M) 20 MEQ tablet Take 1 tablet (20 mEq total) by mouth daily. 30 tablet 6   rivaroxaban (XARELTO) 20 MG TABS tablet TAKE 1 TABLET BY MOUTH ONCE DAILY WITH SUPPER 90 tablet 1   simvastatin (ZOCOR) 20 MG tablet Take 1 tablet by mouth once daily 90 tablet 3   vitamin B-12 (CYANOCOBALAMIN) 50 MCG tablet Take 50 mcg by mouth daily.     VITAMIN D PO Take 1 tablet by mouth daily.     sucralfate (CARAFATE) 1 GM/10ML suspension Take 10 mLs  (1 g total) by mouth 4 (four) times daily -  with meals and at bedtime for 14 days. 560 mL 0   No facility-administered medications prior to visit.    Allergies  Allergen Reactions   Alpha-Gal Anaphylaxis   Dexilant [Dexlansoprazole] Anaphylaxis   Mushroom Ext Cmplx(Shiitake-Reishi-Mait) Anaphylaxis    Rapid heart rate.   Penicillins Anaphylaxis    Has patient had a PCN reaction causing immediate rash, facial/tongue/throat swelling, SOB or lightheadedness with hypotension: Yes Has patient had a PCN reaction causing severe rash involving mucus membranes or skin necrosis: No Has patient had a PCN reaction that required hospitalization Yes Has patient had a PCN reaction occurring within the last 10 years: No If all of the above answers are "NO", then may proceed with Cephalosporin use.    Doxycycline Nausea And Vomiting         ROS Review of Systems  Constitutional:  Negative for fatigue and fever.  Eyes:  Negative for visual disturbance.  Respiratory:  Negative for chest tightness and shortness of breath.   Cardiovascular:  Negative for chest pain and palpitations.  Skin:        lesions  Neurological:  Negative for dizziness and headaches.      Objective:    Physical Exam HENT:     Head: Normocephalic.     Right Ear: External ear normal.     Left Ear: External ear normal.     Nose: No congestion or rhinorrhea.     Mouth/Throat:     Mouth: Mucous membranes are moist.  Cardiovascular:  Rate and Rhythm: Regular rhythm.     Heart sounds: No murmur heard. Pulmonary:     Effort: No respiratory distress.     Breath sounds: Normal breath sounds.  Skin:    Findings: Lesion present.  Neurological:     Mental Status: He is alert.     BP 128/74   Pulse 61   Ht 6' (1.829 m)   Wt 172 lb 1.9 oz (78.1 kg)   SpO2 97%   BMI 23.34 kg/m  Wt Readings from Last 3 Encounters:  06/18/23 172 lb 1.9 oz (78.1 kg)  06/01/23 168 lb 6.4 oz (76.4 kg)  04/27/23 169 lb (76.7 kg)     Lab Results  Component Value Date   TSH 2.300 02/13/2023   Lab Results  Component Value Date   WBC 5.4 06/01/2023   HGB 14.4 06/01/2023   HCT 41.1 06/01/2023   MCV 91.1 06/01/2023   PLT 163 06/01/2023   Lab Results  Component Value Date   NA 141 06/08/2023   K 3.9 06/08/2023   CO2 25 06/08/2023   GLUCOSE 96 06/08/2023   BUN 15 06/08/2023   CREATININE 0.88 06/08/2023   BILITOT 0.4 02/13/2023   ALKPHOS 60 02/13/2023   AST 15 02/13/2023   ALT 23 02/13/2023   PROT 6.5 02/13/2023   ALBUMIN 4.6 02/13/2023   CALCIUM 9.1 06/08/2023   ANIONGAP 9 06/01/2023   EGFR 100 06/08/2023   Lab Results  Component Value Date   CHOL 107 02/13/2023   Lab Results  Component Value Date   HDL 39 (L) 02/13/2023   Lab Results  Component Value Date   LDLCALC 54 02/13/2023   Lab Results  Component Value Date   TRIG 67 02/13/2023   Lab Results  Component Value Date   CHOLHDL 2.7 02/13/2023   Lab Results  Component Value Date   HGBA1C 5.7 (H) 02/13/2023      Assessment & Plan:  Prediabetes Assessment & Plan: For managing prediabetes, I recommend the following lifestyle changes:  Reduce Intake of High-Sugar Foods and Beverages: Limit foods and drinks high in sugar to help regulate blood sugar levels. Increase Consumption of Nutrient-Rich Foods: Focus on incorporating more fruits, vegetables, and whole grains into your diet. Choose Lean Proteins: Opt for lean proteins such as chicken, fish, beans, and legumes. Select Low-Fat Dairy Products: Choose low-fat or non-fat dairy options. Minimize Saturated Fats, Trans Fats, and Cholesterol: Reduce intake of foods high in saturated fats, trans fatty acids, and cholesterol. Engage in Regular Physical Activity: Aim for at least 30 minutes of brisk walking or other moderate activity at least 5 days a week.   Orders: -     Hemoglobin A1c  Essential hypertension Assessment & Plan: Controlled He takes amlodipine 10 mg daily and  metoprolol 12.5 mg daily Asymptomatic today in the clinic Low-sodium diet with increased physical activity encouraged BP Readings from Last 3 Encounters:  06/18/23 128/74  06/02/23 109/68  06/01/23 123/73      Mixed hyperlipidemia Assessment & Plan: Stable on simvastatin 20 mg daily Encouraged decreasing his intake of greasy, fatty, starchy foods with increase physical activity  Lab Results  Component Value Date   CHOL 107 02/13/2023   HDL 39 (L) 02/13/2023   LDLCALC 54 02/13/2023   TRIG 67 02/13/2023   CHOLHDL 2.7 02/13/2023      Orders: -     Lipid panel -     CMP14+EGFR -     CBC with Differential/Platelet  Other  fatigue Assessment & Plan: The patient presents with a chronic complaint of erectile dysfunction and increased fatigue. We will assess his thyroid levels, BMP, CBC, vitamin D levels, and testosterone levels today. Based on the results, we may consider starting sildenafil to help with his erectile dysfunction at his next visit.  I encouraged the patient to increase his physical activity, follow a heart-healthy diet, ensure adequate rest, reduce stress, and continue to improve his overall physical activity for better health.   Orders: -     Testosterone,Free and Total  Dermatitis Assessment & Plan: The patient reports red circular papules on his right lower extremity that appeared a few days ago. He denies any recent insect bites, chemical exposure, or injuries, and he does not experience burning, stinging, or pain at the affected site. He does note occasional itching, which he has been treating with over-the-counter calamine lotion.  We will send a prescription for Kenalog 0.1% cream to be applied twice daily to help decrease inflammation and pruritus. Additionally, the patient mentions light brown lesions on his lower back. These lesions resemble seborrheic keratosis; however, a referral will be placed to dermatology for further  evaluation.      Orders: -     Triamcinolone Acetonide; Apply 1 Application topically 2 (two) times daily.  Dispense: 15 g; Refill: 2 -     Ambulatory referral to Dermatology  Vitamin D deficiency -     VITAMIN D 25 Hydroxy (Vit-D Deficiency, Fractures)  TSH (thyroid-stimulating hormone deficiency) -     TSH + free T4   Note: This chart has been completed using Engineer, civil (consulting) software, and while attempts have been made to ensure accuracy, certain words and phrases may not be transcribed as intended.   Follow-up: Return in about 3 months (around 09/16/2023).   Gilmore Laroche, FNP

## 2023-06-18 NOTE — Patient Instructions (Addendum)
I appreciate the opportunity to provide care to you today!    Follow up:  3 months  Labs: please stop by the lab during the week to get your blood drawn (CBC, CMP, TSH, Lipid profile, HgA1c, Vit D, testosterone)   -Please apply Kenalog 0.1% cream twice daily to the affected site to decrease inflammation and itching  Referrals today- dermatology  Attached with your AVS, you will find valuable resources for self-education. I highly recommend dedicating some time to thoroughly examine them.   Please continue to a heart-healthy diet and increase your physical activities. Try to exercise for at least five days a week.    It was a pleasure to see you and I look forward to continuing to work together on your health and well-being. Please do not hesitate to call the office if you need care or have questions about your care.  In case of emergency, please visit the Emergency Department for urgent care, or contact our clinic at [phone] to schedule an appointment. We're here to help you!   Have a wonderful day and week. With Gratitude, Gilmore Laroche MSN, FNP-BC

## 2023-06-19 ENCOUNTER — Ambulatory Visit (INDEPENDENT_AMBULATORY_CARE_PROVIDER_SITE_OTHER): Payer: Medicaid Other | Admitting: Clinical

## 2023-06-19 DIAGNOSIS — E559 Vitamin D deficiency, unspecified: Secondary | ICD-10-CM | POA: Diagnosis not present

## 2023-06-19 DIAGNOSIS — F4001 Agoraphobia with panic disorder: Secondary | ICD-10-CM | POA: Diagnosis not present

## 2023-06-19 DIAGNOSIS — F4310 Post-traumatic stress disorder, unspecified: Secondary | ICD-10-CM

## 2023-06-19 DIAGNOSIS — R5383 Other fatigue: Secondary | ICD-10-CM | POA: Diagnosis not present

## 2023-06-19 DIAGNOSIS — E782 Mixed hyperlipidemia: Secondary | ICD-10-CM | POA: Diagnosis not present

## 2023-06-19 DIAGNOSIS — F418 Other specified anxiety disorders: Secondary | ICD-10-CM

## 2023-06-19 DIAGNOSIS — L309 Dermatitis, unspecified: Secondary | ICD-10-CM | POA: Insufficient documentation

## 2023-06-19 DIAGNOSIS — F419 Anxiety disorder, unspecified: Secondary | ICD-10-CM

## 2023-06-19 DIAGNOSIS — F32A Depression, unspecified: Secondary | ICD-10-CM

## 2023-06-19 DIAGNOSIS — R7303 Prediabetes: Secondary | ICD-10-CM | POA: Diagnosis not present

## 2023-06-19 DIAGNOSIS — E038 Other specified hypothyroidism: Secondary | ICD-10-CM | POA: Diagnosis not present

## 2023-06-19 NOTE — Assessment & Plan Note (Signed)
Stable on simvastatin 20 mg daily Encouraged decreasing his intake of greasy, fatty, starchy foods with increase physical activity  Lab Results  Component Value Date   CHOL 107 02/13/2023   HDL 39 (L) 02/13/2023   LDLCALC 54 02/13/2023   TRIG 67 02/13/2023   CHOLHDL 2.7 02/13/2023

## 2023-06-19 NOTE — Assessment & Plan Note (Addendum)
Controlled He takes amlodipine 10 mg daily and metoprolol 12.5 mg daily Asymptomatic today in the clinic Low-sodium diet with increased physical activity encouraged BP Readings from Last 3 Encounters:  06/18/23 128/74  06/02/23 109/68  06/01/23 123/73

## 2023-06-19 NOTE — Assessment & Plan Note (Signed)
The patient reports red circular papules on his right lower extremity that appeared a few days ago. He denies any recent insect bites, chemical exposure, or injuries, and he does not experience burning, stinging, or pain at the affected site. He does note occasional itching, which he has been treating with over-the-counter calamine lotion.  We will send a prescription for Kenalog 0.1% cream to be applied twice daily to help decrease inflammation and pruritus. Additionally, the patient mentions light brown lesions on his lower back. These lesions resemble seborrheic keratosis; however, a referral will be placed to dermatology for further evaluation.

## 2023-06-19 NOTE — Assessment & Plan Note (Signed)
The patient presents with a chronic complaint of erectile dysfunction and increased fatigue. We will assess his thyroid levels, BMP, CBC, vitamin D levels, and testosterone levels today. Based on the results, we may consider starting sildenafil to help with his erectile dysfunction at his next visit.  I encouraged the patient to increase his physical activity, follow a heart-healthy diet, ensure adequate rest, reduce stress, and continue to improve his overall physical activity for better health.

## 2023-06-19 NOTE — Progress Notes (Signed)
Virtual Visit via Video Note   I connected with Richard [name] on 06/19/23 at  2:00 PM EDT by a video enabled telemedicine application and verified that I am speaking with the correct person using two identifiers.   Location: Patient: home Provider: office   I discussed the limitations of evaluation and management by telemedicine and the availability of in person appointments. The patient expressed understanding and agreed to proceed.     THERAPIST PROGRESS NOTE     Session Time: 2:00 PM-2:45 PM   Participation Level: Active   Behavioral Response: Casual and Alert,Anxious   Type of Therapy: Individual Therapy   Treatment Goals addressed: Mood and Anxiety   Interventions: CBT   Summary: Richard [name] is a 57 y.o. male who presents with panic disorder/depression with anxiety/and PTSD . The OPT therapist worked with the patient for his OPT treatment. The OPT therapist utilized Motivational Interviewing to assist in creating therapeutic repore. The patient in the session was engaged and work in collaboration giving feedback about his triggers and symptoms over the past few weeks The patient spoke about his wifes ongoing recovery from surgery procedure . The patient noted with the cooler weather he has been preparing for the upcoming Winter months and plans for keeping the home heated. The OPT therapist utilized Cognitive Behavioral Therapy through cognitive restructuring as well as worked with the patient on coping strategies to assist in management of symptoms as well as reviewed sleep, eating habits, and general health. The patient continues to work on acceptance of limited mobility based on health conditions.The OPT therapist overviewed upcoming health appointments as listed in the patients MyChart.   Suicidal/Homicidal: Nowithout intent/plan   Therapist Response:The OPT therapist worked with the patient for the patients scheduled session. The patient was engaged in his session and gave  feedback in relation to triggers, symptoms, and behavior responses over the past few weeks. The OPT therapist worked with the patient utilizing an in session Cognitive Behavioral Therapy exercise. The patient was responsive in the session and verbalized, " I am coming to terms with my health and my limitations and having my wife home with me has been helpful we are staying busy and working on things in the home".The OPT therapist worked with the patient providing ongoing psychotherapy/education and coping skills review including adaptations for the Winter season.The patient spoke about upcoming Christmas holiday and plans to go to his daughters home to celebrate with family. The OPT therapist will continue treatment work with the patient in his next scheduled session   Plan: Return again in 2/3 weeks.   Diagnosis:      Axis I: PTSD/Panic Disorder/ Depression with anxiety  Axis II: No diagnosis      Collaboration of Care: No additional collaboration of care for this session.   Patient/Guardian was advised Release of Information must be obtained prior to any record release in order to collaborate their care with an outside provider. Patient/Guardian was advised if they have not already done so to contact the registration department to sign all necessary forms in order for [name] to release information regarding their care.    Consent: Patient/Guardian gives verbal consent for treatment and assignment of benefits for services provided during this visit. Patient/Guardian expressed understanding and agreed to proceed.    I discussed the assessment and treatment plan with the patient. The patient was provided an opportunity to ask questions and all were answered. The patient agreed with the plan and demonstrated an understanding of  the instructions.   The patient was advised to call back or seek an in-person evaluation if the symptoms worsen or if the condition fails to improve as anticipated.   I provided  45 minutes of non-face-to-face time during this encounter.   Richard Burn, LCSW    06/19/2023

## 2023-06-19 NOTE — Assessment & Plan Note (Signed)
For managing prediabetes, I recommend the following lifestyle changes:  Reduce Intake of High-Sugar Foods and Beverages: Limit foods and drinks high in sugar to help regulate blood sugar levels. Increase Consumption of Nutrient-Rich Foods: Focus on incorporating more fruits, vegetables, and whole grains into your diet. Choose Lean Proteins: Opt for lean proteins such as chicken, fish, beans, and legumes. Select Low-Fat Dairy Products: Choose low-fat or non-fat dairy options. Minimize Saturated Fats, Trans Fats, and Cholesterol: Reduce intake of foods high in saturated fats, trans fatty acids, and cholesterol. Engage in Regular Physical Activity: Aim for at least 30 minutes of brisk walking or other moderate activity at least 5 days a week.

## 2023-06-20 LAB — LIPID PANEL
Chol/HDL Ratio: 2.9 {ratio} (ref 0.0–5.0)
Cholesterol, Total: 115 mg/dL (ref 100–199)
HDL: 39 mg/dL — ABNORMAL LOW (ref >39)
LDL Chol Calc (NIH): 61 mg/dL (ref 0–99)
Triglycerides: 70 mg/dL (ref 0–149)
VLDL Cholesterol Cal: 15 mg/dL (ref 5–40)

## 2023-06-20 LAB — CMP14+EGFR
ALT: 23 [IU]/L (ref 0–44)
AST: 14 [IU]/L (ref 0–40)
Albumin: 4.3 g/dL (ref 3.8–4.9)
Alkaline Phosphatase: 57 [IU]/L (ref 44–121)
BUN/Creatinine Ratio: 11 (ref 9–20)
BUN: 11 mg/dL (ref 6–24)
Bilirubin Total: 0.5 mg/dL (ref 0.0–1.2)
CO2: 26 mmol/L (ref 20–29)
Calcium: 9 mg/dL (ref 8.7–10.2)
Chloride: 103 mmol/L (ref 96–106)
Creatinine, Ser: 0.96 mg/dL (ref 0.76–1.27)
Globulin, Total: 2.3 g/dL (ref 1.5–4.5)
Glucose: 102 mg/dL — ABNORMAL HIGH (ref 70–99)
Potassium: 4.5 mmol/L (ref 3.5–5.2)
Sodium: 141 mmol/L (ref 134–144)
Total Protein: 6.6 g/dL (ref 6.0–8.5)
eGFR: 92 mL/min/{1.73_m2} (ref >59)

## 2023-06-20 LAB — CBC WITH DIFFERENTIAL/PLATELET
Basophils Absolute: 0 10*3/uL (ref 0.0–0.2)
Basos: 0 %
EOS (ABSOLUTE): 0.2 10*3/uL (ref 0.0–0.4)
Eos: 3 %
Hematocrit: 45.3 % (ref 37.5–51.0)
Hemoglobin: 15.4 g/dL (ref 13.0–17.7)
Immature Grans (Abs): 0 10*3/uL (ref 0.0–0.1)
Immature Granulocytes: 0 %
Lymphocytes Absolute: 1.6 10*3/uL (ref 0.7–3.1)
Lymphs: 27 %
MCH: 31.5 pg (ref 26.6–33.0)
MCHC: 34 g/dL (ref 31.5–35.7)
MCV: 93 fL (ref 79–97)
Monocytes Absolute: 0.4 10*3/uL (ref 0.1–0.9)
Monocytes: 7 %
Neutrophils Absolute: 3.7 10*3/uL (ref 1.4–7.0)
Neutrophils: 63 %
Platelets: 202 10*3/uL (ref 150–450)
RBC: 4.89 x10E6/uL (ref 4.14–5.80)
RDW: 12.4 % (ref 11.6–15.4)
WBC: 6 10*3/uL (ref 3.4–10.8)

## 2023-06-20 LAB — HEMOGLOBIN A1C
Est. average glucose Bld gHb Est-mCnc: 120 mg/dL
Hgb A1c MFr Bld: 5.8 % — ABNORMAL HIGH (ref 4.8–5.6)

## 2023-06-20 LAB — VITAMIN D 25 HYDROXY (VIT D DEFICIENCY, FRACTURES): Vit D, 25-Hydroxy: 52 ng/mL (ref 30.0–100.0)

## 2023-06-20 LAB — TSH+FREE T4
Free T4: 1.25 ng/dL (ref 0.82–1.77)
TSH: 1.25 u[IU]/mL (ref 0.450–4.500)

## 2023-06-21 ENCOUNTER — Other Ambulatory Visit: Payer: Self-pay | Admitting: Internal Medicine

## 2023-06-21 LAB — TESTOSTERONE,FREE AND TOTAL
Testosterone, Free: 5.1 pg/mL — ABNORMAL LOW (ref 7.2–24.0)
Testosterone: 582 ng/dL (ref 264–916)

## 2023-06-22 ENCOUNTER — Other Ambulatory Visit: Payer: Self-pay | Admitting: Family Medicine

## 2023-06-22 DIAGNOSIS — R7989 Other specified abnormal findings of blood chemistry: Secondary | ICD-10-CM

## 2023-07-02 NOTE — Patient Instructions (Addendum)
        Great to see you today.  I have refilled the medication(s) we provide.   Eustachian Tube Dysfunction Cause: Dental procedures can sometimes cause inflammation or pressure changes in the eustachian tube, which connects the middle ear to the throat. This can lead to ear popping, fullness, and pain. Symptoms: Popping or crackling sounds in the ear, ear pain, a feeling of fullness, and occasional muffled hearing. Management: Use nasal saline sprays or decongestants to reduce inflammation. Stay hydrated to promote normal ear function. Anti-inflammatory medications such as tylenol or prednisone   Pain and popping in the left ear after dental surgery are often related to TMJ strain, eustachian tube dysfunction, or post-surgical inflammation. Conservative measures like rest, anti-inflammatories, and gentle jaw exercises usually help. However, persistent or worsening symptoms should prompt evaluation by a dentist, ENT specialist.    If labs were collected, we will inform you of lab results once received either by echart message or telephone call.   - echart message- for normal results that have been seen by the patient already.   - telephone call: abnormal results or if patient has not viewed results in their echart.   - Please take medications as prescribed. - Follow up with your primary health provider if any health concerns arises. - If symptoms worsen please contact your primary care provider and/or visit the emergency department.

## 2023-07-02 NOTE — Progress Notes (Signed)
Established Patient Office Visit   Subjective  Patient ID: Richard [name], male    DOB: 1966/03/08  Age: 57 y.o. MRN: [id number]  Chief Complaint  Patient presents with   Acute Visit    Possible UTI pt. States he feels knots on testicles lft side. Slight discomfort. Dark urine w/ strong aroma, sometimes pain w/ urination.  Pain and popping in lft ear since dental surgery, has completed antibiotic but symptoms persist.     He  has a past medical history of Arthritis, Asthma, CAD (coronary artery disease), Colitis (1990), COPD (chronic obstructive pulmonary disease) (HCC), Depression, Dysrhythmia, Essential hypertension, Fatty liver, Gastric ulcer (2003; 2012), GERD (gastroesophageal reflux disease), Hepatic steatosis, History of hiatal hernia, Hyperlipemia, Overweight, Panic attacks, Paroxysmal atrial fibrillation (HCC), Pulmonary nodules, Stroke (HCC), TIA (transient ischemic attack), and Type II diabetes mellitus (HCC).  Patient presents with symptoms of dark urine, left-sided pelvic discomfort, and urinary frequency with nighttime awakenings. His symptoms have been present for 3 weeks, occurring intermittently with a gradual worsening over time. His discomfort is described as a burning sensation during urination, with a severity of 3/10. Patient denies fever and has no history of pyelonephritis, recurrent UTIs, or kidney stones. Associated symptoms include flank pain, urinary urgency, nausea, and increased frequency, while pertinent negatives include the absence of discharge, hematuria, or vomiting. Patient is sexually active and has attempted to increase fluid intake, but this provided no relief.    Review of Systems  Constitutional:  Negative for chills and fever.  Respiratory:  Negative for shortness of breath.   Cardiovascular:  Negative for chest pain.  Genitourinary:  Positive for dysuria, frequency and urgency. Negative for flank pain and hematuria.      Objective:     BP  130/72 (Cuff Size: Normal)   Pulse (!) 57   Ht 6\' 1"  (1.854 m)   Wt 174 lb 1.9 oz (79 kg)   SpO2 97%   BMI 22.97 kg/m  BP Readings from Last 3 Encounters:  07/03/23 130/72  06/18/23 128/74  06/02/23 109/68      Physical Exam Vitals reviewed.  Constitutional:      General: He is not in acute distress.    Appearance: Normal appearance. He is not ill-appearing, toxic-appearing or diaphoretic.  HENT:     Head: Normocephalic.  Eyes:     General:        Right eye: No discharge.        Left eye: No discharge.     Conjunctiva/sclera: Conjunctivae normal.  Cardiovascular:     Rate and Rhythm: Normal rate.     Pulses: Normal pulses.     Heart sounds: Normal heart sounds.  Pulmonary:     Effort: Pulmonary effort is normal. No respiratory distress.     Breath sounds: Normal breath sounds.  Abdominal:     General: Bowel sounds are normal.     Palpations: Abdomen is soft.     Tenderness: There is no abdominal tenderness. There is no right CVA tenderness, left CVA tenderness or guarding.  Musculoskeletal:        General: Normal range of motion.  Skin:    General: Skin is warm and dry.     Capillary Refill: Capillary refill takes less than 2 seconds.  Neurological:     Mental Status: He is alert.  Psychiatric:        Mood and Affect: Mood normal.      No results found for any visits on 07/03/23.  The ASCVD Risk score (Arnett DK, et al., 2019) failed to calculate for the following reasons:   Risk score cannot be calculated because patient has a medical history suggesting prior/existing ASCVD    Assessment & Plan:  Urinary tract infection without hematuria, site unspecified Assessment & Plan: Urinalysis, and urine culture ordered- Awaiting results will follow up. Advise to drink plenty of water 8 glasses of water daily to help flush bacteria from the urinary tract and avoid irritants like caffeine, alcohol, and spicy foods. Practice good hygiene, including keeping the genital  area clean and urinating after sexual activity.  Orders: -     Urine Culture -     Urinalysis    Return if symptoms worsen or fail to improve.   Cruzita Lederer Newman Nip, FNP

## 2023-07-03 ENCOUNTER — Ambulatory Visit (INDEPENDENT_AMBULATORY_CARE_PROVIDER_SITE_OTHER): Payer: Medicaid Other | Admitting: Family Medicine

## 2023-07-03 ENCOUNTER — Encounter: Payer: Self-pay | Admitting: Family Medicine

## 2023-07-03 VITALS — BP 130/72 | HR 57 | Ht 73.0 in | Wt 174.1 lb

## 2023-07-03 DIAGNOSIS — N390 Urinary tract infection, site not specified: Secondary | ICD-10-CM | POA: Insufficient documentation

## 2023-07-03 MED ORDER — PREDNISONE 20 MG PO TABS: 20.0000 mg | ORAL_TABLET | Freq: Two times a day (BID) | ORAL | 0 refills | Status: DC

## 2023-07-03 NOTE — Assessment & Plan Note (Signed)
Urinalysis, and urine culture ordered- Awaiting results will follow up. Advise to drink plenty of water 8 glasses of water daily to help flush bacteria from the urinary tract and avoid irritants like caffeine, alcohol, and spicy foods. Practice good hygiene, including keeping the genital area clean and urinating after sexual activity.

## 2023-07-04 ENCOUNTER — Ambulatory Visit: Payer: Medicaid Other | Admitting: Internal Medicine

## 2023-07-04 ENCOUNTER — Encounter: Payer: Self-pay | Admitting: Internal Medicine

## 2023-07-04 VITALS — BP 128/70 | HR 67 | Ht 73.0 in | Wt 172.0 lb

## 2023-07-04 DIAGNOSIS — F17210 Nicotine dependence, cigarettes, uncomplicated: Secondary | ICD-10-CM | POA: Diagnosis not present

## 2023-07-04 DIAGNOSIS — R058 Other specified cough: Secondary | ICD-10-CM | POA: Diagnosis not present

## 2023-07-04 NOTE — Patient Instructions (Signed)
For cough use mucinex dm 1200 mg every 12 hours as needed   Symbiocrt 80 up to 2 puffs every 12 hours as needed   Only use your albuterol as a rescue medication to be used if you can't catch your breath by resting or doing a relaxed purse lip breathing pattern.  - The less you use it, the better it will work when you need it. - Ok to use up to 2 puffs  every 4 hours if you must but call for immediate appointment if use goes up over your usual need - Don't leave home without it !!  (think of it like the spare tire for your car)   Also  Ok to try albuterol 15 min before an activity (on alternating days)  that you know would usually make you short of breath and see if it makes any difference and if makes none then don't take albuterol after activity unless you can't catch your breath as this means it's the resting that helps, not the albuterol.      Please schedule a follow up visit in 3 months but call sooner if needed with pfts on return

## 2023-07-04 NOTE — Progress Notes (Signed)
Subjective:    Patient ID: Richard [name], male    DOB: 04/03/1966,    MRN: [id number]  HPI  43 yowm active smoker with onset of doe around 2011 much worse x 2015-16 to point where has trouble to walking to mailbox x 173ft slt uphill to [place]  so referred to pulmonary clinic 11/17/2015 by Dr Ledell Peoples.   11/17/2015 1st [facility] office visit/ [name]  On advair 250 bid  Chief Complaint  Patient presents with   pulmonary consult    pt ref by dr. Ledell Peoples for SOB. dry cough occ prod, wheezing mainly @ night, occ cp.   indolent onset doe x 6 years worse with certain smells and some better on advair/ventolin as long as avoids exertion but  wheezing every night x one year disturbs sleep On nexium bid but not ac Rec Plan A = Automatic = Symbicort 80 Take 2 puffs first thing in am and then another 2 puffs about 12 hours later.  Plan B = Backup Only use your albuterol(ventolin)  as a rescue medication  Stop lisinopril and start valsartan 80 mg one daily in its place   nexium Take 30- 60 min before your first and last meals of the day  GERD diet  Please schedule a follow up office visit in 4 weeks, sooner if needed > did not return as rec   03/14/2022  Re-establish ov/[place] office/Redell Nazir re: AB maint on saba prn   Chief Complaint  Patient presents with   Consult    Consult for pulm emphysema. Lung pain and SOB   Dyspnea:  several [employer]isles at [employer] variable speeds then gives out  Cough: varies during the day, not in AM/ just mucoid / powdered inhalers make it worse  Sleeping: bed has 2.5 inches or bad gerd  SABA use: up to 3 x days , never prechallenges 02: checks levels 20 min p arrives  Covid status: no vaccines, never infected  Lung cancer screening: had ct 09/2021 Rec Nexium 40 mg Take 30- 60 min before your first and last meals of the day  GERD diet reviewed, bed blocks rec  Breztri one puff every 12 hours - if helps can try dulera or symbiocrt thru patient assistance Only use your  albuterol as a rescue medication  Ok to try albuterol 15 min before an activity (on alternating days)  that you know would usually make you short of breath     04/25/2022  f/u ov/[name] office/[name] re: doe  maint on nothing  / breztri made him cough Chief Complaint  Patient presents with   Follow-up    Breathing has not changed since last ov   Dyspnea:  [employer] walking /5 squats / able to cross a half parking   Cough: clear phlegy Sleeping: slt elevation  SABA use: none on day of, up to twice daily as needed but does not pre-challenge or rechallenge as rec  02: none  Covid status: never 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  x 5 years > filed for disability  Rec Plan A = Automatic = Always=    Stiolto 2 puffs each am daily  Work on inhaler technique:   Plan B = Backup (to supplement plan A, not to replace it) Only use your albuterol inhaler as a rescue medication Ok to try albuterol 15 min before an activity (on alternating days)  that you know would usually make you short of breath   Get rid of the mold and cigarettes  Pulmonary  follow up is as  needed   01/02/2023  f/u ov/[employer] office/[name] re: doe still smoking  maint on prn saba   Chief Complaint  Patient presents with   Follow-up  Dyspnea:  highly variable / worse in heat Cough: better off stiolto  Sleeping: bed blocks no resp cc SABA use: ventolin seems to help the most maybe up to twice daily   02: none  Rec Stop stiolto  Stop smoking completely before smoking completely stops you! Plan A = Automatic = Always=    Symbicort 80 Take 2 puffs first thing in am and then another 2 puffs about 12 hours later take for at least a week to get full benefit before considering any taper to 0-2 puffs every 12 hours as needed  Work on inhaler technique Plan B = Backup (to supplement plan A, not to replace it) Only use your albuterol inhaler as a rescue medication Please schedule a follow up visit in 6 months but call sooner if  needed     07/04/2023  f/u ov/[place] office/[name] re: AB maint on symbicort 80  3-4 x per week in am / not typically in pm / and still smoking  Chief Complaint  Patient presents with   Shortness of Breath  Dyspnea:  can walk entire [employer] n[place] pace   Cough: variably  thick mostly white not using mucinex  Sleeping: bed blocks s resp cc s  am cough until stands  SABA use: not as much / did not bring it       No obvious day to day or daytime variability or assoc excess sputum or mucus plugs or hemoptysis or cp or chest tightness, subjective wheeze or overt sinus or hb symptoms.    Also denies any obvious fluctuation of symptoms with weather or environmental changes or other aggravating or alleviating factors except as outlined above   No unusual exposure hx or h/o childhood pna/ asthma or knowledge of premature birth.  Current Allergies, Complete Past Medical History, Past Surgical History, Family History, and Social History were reviewed in [employer] record.  ROS  The following are not active complaints unless bolded Hoarseness, sore throat, dysphagia, dental problems, itching, sneezing,  nasal congestion or discharge of excess mucus or purulent secretions, ear ache,   fever, chills, sweats, unintended wt loss or wt gain, classically pleuritic or exertional cp,  orthopnea pnd or arm/hand swelling  or leg swelling, presyncope, palpitations, abdominal pain, anorexia, nausea, vomiting, diarrhea  or change in bowel habits or change in bladder habits, change in stools or change in urine, dysuria, hematuria,  rash, arthralgias, visual complaints, headache, numbness, weakness or ataxia or problems with walking or coordination,  change in mood or  memory.        Current Meds  Medication Sig   acetaminophen (TYLENOL) 325 MG tablet Take 650 mg by mouth every 6 (six) hours as needed for moderate pain.   albuterol (VENTOLIN HFA) 108 (90 Base) MCG/ACT inhaler INHALE 2 PUFFS  BY MOUTH EVERY 6 HOURS AS NEEDED FOR COUGHING, WHEEZING, OR SHORTNESS OF BREATH   ALPRAZolam (XANAX) 0.5 MG tablet Take one twice a day and two at bedtime   amLODipine (NORVASC) 10 MG tablet Take 1 tablet by mouth once daily   azelastine (ASTELIN) 0.1 % nasal spray Place 1 spray into both nostrils 2 (two) times daily as needed. Use in each nostril as directed   baclofen (LIORESAL) 10 MG tablet Take 1 tablet (10 mg total) by  mouth 2 (two) times daily. 30 minutes before breakfast and dinner   budesonide-formoterol (SYMBICORT) 80-4.5 MCG/ACT inhaler Take 2 puffs first thing in am and then another 2 puffs about 12 hours later.   dronedarone (MULTAQ) 400 MG tablet Take 1 tablet (400 mg total) by mouth 2 (two) times daily with a meal.   EPINEPHrine 0.3 mg/0.3 mL IJ SOAJ injection Inject 0.3 mg into the muscle as needed for anaphylaxis.   esomeprazole (NEXIUM) 40 MG capsule Take 1 capsule (40 mg total) by mouth 2 (two) times daily before a meal.   fluticasone (FLONASE) 50 MCG/ACT nasal spray Place 2 sprays into both nostrils daily.   isosorbide mononitrate (IMDUR) 30 MG 24 hr tablet Take 1 tablet (30 mg total) by mouth daily.   metoprolol succinate (TOPROL XL) 25 MG 24 hr tablet Take 0.5 tablets (12.5 mg total) by mouth daily.   nitroGLYCERIN (NITROSTAT) 0.4 MG SL tablet Place 1 tablet (0.4 mg total) under the tongue every 5 (five) minutes x 3 doses as needed for chest pain (if no relief after 3rd dose, proceed to the ED for an evaluation).   potassium chloride SA (KLOR-CON M) 20 MEQ tablet Take 1 tablet (20 mEq total) by mouth daily.   predniSONE (DELTASONE) 20 MG tablet Take 20 mg by mouth 2 (two) times daily.   rivaroxaban (XARELTO) 20 MG TABS tablet TAKE 1 TABLET BY MOUTH ONCE DAILY WITH SUPPER   simvastatin (ZOCOR) 20 MG tablet Take 1 tablet by mouth once daily   triamcinolone cream (KENALOG) 0.1 % Apply 1 Application topically 2 (two) times daily.   vitamin B-12 (CYANOCOBALAMIN) 50 MCG tablet Take  50 mcg by mouth daily.   VITAMIN D PO Take 1 tablet by mouth daily.                  Objective:   Physical Exam   Wts  07/04/2023       172  01/02/2023         170  04/25/2022      173  03/14/2022        175  11/17/15 207 lb 9.6 oz (94.167 kg)  10/14/15 205 lb 6.4 oz (93.169 kg)  10/07/15 204 lb (92.534 kg)    Vital signs reviewed  07/04/2023  - Note at rest 02 sats  97% on RA   General appearance:    amb wm did not use any inhalers  am of ov    HEENT : Oropharynx  clear       NECK :  without  apparent JVD/ palpable Nodes/TM    LUNGS: no acc muscle use,  Min barrel  contour chest wall with bilateral  slightly decreased bs s audible wheeze and  without cough on insp or exp maneuvers and min  Hyperresonant  to  percussion bilaterally    CV:  RRR  no s3 or murmur or increase in P2, and no edema   ABD:  soft and nontender with pos end  insp Hoover's  in the supine position.  No bruits or organomegaly appreciated   MS:  Nl gait/ ext warm without deformities Or obvious joint restrictions  calf tenderness, cyanosis or clubbing     SKIN: warm and dry without lesions    NEURO:  alert, approp, nl sensorium with  no motor or cerebellar deficits apparent.         I personally reviewed images and agree with radiology impression as follows:  Chest LDSCT  12/20/22  Lung-RADS 1, negative. Continue annual screening with low-dose chest CT without contrast in 12 months.  Aortic Atherosclerosis (ICD10-I70.0) and Emphysema (ICD10-J43.9).      Assessment & Plan:

## 2023-07-04 NOTE — Assessment & Plan Note (Signed)
Quit ACEi  11/2015  - intol of DPI's  Reported 03/14/2022  - max rx for GERD 03/14/2022 >>> improved as of 01/02/2023 > try adding sym b80  bid and if cough flares more likely this is UACS than asthma related > f/u with allergy either way and here q 6 m, sooner prn   - Sinus CT 07/04/2023 >>>   - The proper method of use, as well as anticipated side effects, of a metered-dose inhaler were discussed and demonstrated to the patient using teach back method.    Still not convinced there is much asthma here and doubt much copd either based on prior pfts so rec   1) complete the w/u with sinus CT  2) Keep appt with allergy in March as planned   3) if there is asthma it is very mild and therefore Based on two studies from NEJM  378; 20 p 1865 (2018) and 380 : p2020-30 (2019) in pts with mild asthma it is reasonable to use low dose symbicort eg 80 2bid "prn" flare in this setting but I emphasized this was only shown with symbicort and takes advantage of the rapid onset of action but is not the same as "rescue therapy" but can be stopped once the acute symptoms have resolved and the need for rescue has been minimized (< 2 x weekly)    F/u with pfts in 3 m, call sooner prn          Each maintenance medication was reviewed in detail including emphasizing most importantly the difference between maintenance and prns and under what circumstances the prns are to be triggered using an action plan format where appropriate.  Total time for H and P, chart review, counseling, reviewing hfa  device(s) and generating customized AVS unique to this office visit / same day charting > 30 min for multiple  refractory respiratory  symptoms of uncertain etiology

## 2023-07-04 NOTE — Assessment & Plan Note (Signed)
4-5 min discussion re active cigarette smoking in addition to office E&M  Ask about tobacco use:   ongoing  Advise quitting   I took an extended  opportunity with this patient to outline the consequences of continued cigarette use  in airway disorders based on all the data we have from the multiple national lung health studies (perfomed over decades at millions of dollars in cost)  indicating that smoking cessation, not choice of inhalers or pulmonary physicians, is the most important aspect of his care.   Assess willingness:  Not committed at this point Assist in quit attempt:  Per PCP when ready Arrange follow up:   Follow up per Primary Care planned  For smoking cessation classes call [phone]

## 2023-07-05 LAB — URINALYSIS
Bilirubin, UA: NEGATIVE
Glucose, UA: NEGATIVE
Leukocytes,UA: NEGATIVE
Nitrite, UA: NEGATIVE
RBC, UA: NEGATIVE
Specific Gravity, UA: 1.025 (ref 1.005–1.030)
Urobilinogen, Ur: 0.2 mg/dL (ref 0.2–1.0)
pH, UA: 5.5 (ref 5.0–7.5)

## 2023-07-05 LAB — URINE CULTURE

## 2023-07-17 ENCOUNTER — Other Ambulatory Visit: Payer: Self-pay | Admitting: Physician Assistant

## 2023-07-18 DIAGNOSIS — Z419 Encounter for procedure for purposes other than remedying health state, unspecified: Secondary | ICD-10-CM | POA: Diagnosis not present

## 2023-07-19 ENCOUNTER — Ambulatory Visit: Payer: Medicaid Other | Admitting: Gastroenterology

## 2023-07-19 ENCOUNTER — Encounter: Payer: Self-pay | Admitting: Gastroenterology

## 2023-07-19 VITALS — BP 119/71 | HR 57 | Temp 97.1°F | Ht 73.0 in | Wt 172.4 lb

## 2023-07-19 DIAGNOSIS — R1310 Dysphagia, unspecified: Secondary | ICD-10-CM

## 2023-07-19 DIAGNOSIS — K219 Gastro-esophageal reflux disease without esophagitis: Secondary | ICD-10-CM | POA: Diagnosis not present

## 2023-07-19 MED ORDER — ESOMEPRAZOLE MAGNESIUM 40 MG PO CPDR: 40.0000 mg | DELAYED_RELEASE_CAPSULE | Freq: Two times a day (BID) | ORAL | 5 refills | Status: DC

## 2023-07-19 NOTE — Progress Notes (Signed)
 Gastroenterology Office Note     Primary Care Physician:  Zarwolo, Gloria, FNP  Primary Gastroenterologist: Dr. Cindie   Chief Complaint   Chief Complaint  Patient presents with   Gastroesophageal Reflux    Follow up on GERD. Doing better.      History of Present Illness   Richard [name] is a 58 y.o. male presenting today with a history of chronic GERD, abdominal pain, dysphagia, constipation, weight loss, esophageal dysmotility but unable to complete manometry due to transportation.   Due to persistent symptoms of feeling knot in mid esophagus, we updated BPE. This was normal. 13 mm barium tablet passed easily but the sensation of pressure in chest was noted when tablet passed. He was hesistant to start Baclofen  .  Some regurgitation at times. GERD feels more controlled. Sitting at recliner to eat. Leans back when eating and then when leans forward pushes back up. Brand name Nexium  works well and has to buy this. Denies any dysphagia. When regurgitating, he has the knot feeling occur.    Pintpoint pain, like a poker at times underneath sternum. Not associated with eating. Goes away on own.   Despite all of this, he actually feels better than he has felt in some time.    EGD May 2024: abnormal esophageal motility, consistent with presbyesophagus, suspicious changes for Barrett's but negative path, gastritis, normal duodenum. Negative H.pylori.    EGD 07/2021: small hiatal hernia, abnormal esophageal motility, iron pill gastritis, no h.pylori, mucosal variant in duodenum (bx with focal lymphangiectasia, neg for celiac)    Last colonoscopy 2018, 1 benign polyp removed, recommended 10-year recall.       Past Medical History:  Diagnosis Date   Arthritis    Asthma    CAD (coronary artery disease)    a. Cardiac cath 07/2015 showed 65% distal Cx, 20% mid-distal LAD, 20% prox-distal RCA, EF 60%, EDP .   Colitis 1990   COPD (chronic obstructive pulmonary disease) (HCC)     Depression    Dysrhythmia    Essential hypertension    Fatty liver    Gastric ulcer 2003; 2012   2003: + esophagitis; negative H.pylori serology  2012: Dr. Harvey, mild gastritis, Bravo PH probe placement, negative H.pylori   GERD (gastroesophageal reflux disease)    Hepatic steatosis    History of hiatal hernia    Hyperlipemia    Overweight    Panic attacks    Paroxysmal atrial fibrillation (HCC)    Pulmonary nodules    Stroke (HCC)    TIA (transient ischemic attack)    Type II diabetes mellitus (HCC)     Past Surgical History:  Procedure Laterality Date   BALLOON DILATION N/A 07/25/2021   Procedure: BALLOON DILATION;  Surgeon: Cindie Carlin POUR, DO;  [facility];  Service: Endoscopy;  Laterality: N/A;   BIOPSY  07/25/2021   Procedure: BIOPSY;  Surgeon: Cindie Carlin POUR, DO;  [facility];  Service: Endoscopy;;   BIOPSY  12/04/2022   Procedure: BIOPSY;  Surgeon: Cindie Carlin POUR, DO;  [facility];  Service: Endoscopy;;   BRAVO PH STUDY  05/03/2011   DOQ:Fpoi gastritis/normal esophagus and duodenum   CARDIAC CATHETERIZATION  1990s X 1; 2005; 08/12/2015   CARDIAC CATHETERIZATION N/A 08/12/2015   Procedure: Left Heart Cath and Coronary Angiography;  Surgeon: Victory LELON Sharps, MD; LAD 20%, CFX 65%, RCA 20%, EF 60%    COLONOSCOPY  1990   COLONOSCOPY WITH PROPOFOL  N/A 11/21/2016  Dr. Harvey: non-thrombosed external hemorrhoids, one 6 mm polyp (polypoid lesion), internal hemorrhoids. TI Normal. 10 years screening   ESOPHAGOGASTRODUODENOSCOPY  05/03/2011   DOQ:fpoi gastritis   ESOPHAGOGASTRODUODENOSCOPY (EGD) WITH PROPOFOL  N/A 07/25/2021   Procedure: ESOPHAGOGASTRODUODENOSCOPY (EGD) WITH PROPOFOL ;  Surgeon: Cindie Carlin POUR, DO;  [facility];  Service: Endoscopy;  Laterality: N/A;  1:30pm   ESOPHAGOGASTRODUODENOSCOPY (EGD) WITH PROPOFOL  N/A 12/04/2022   Procedure: ESOPHAGOGASTRODUODENOSCOPY (EGD) WITH PROPOFOL ;  Surgeon: Cindie Carlin POUR, DO;   [facility];  Service: Endoscopy;  Laterality: N/A;  8:00AM;ASA 3   NECK MASS EXCISION Right    done in dr's office; behind right ear/side of ncek   POLYPECTOMY  11/21/2016   Procedure: POLYPECTOMY;  Surgeon: Harvey Margo CROME, MD;  [facility];  Service: Endoscopy;;  descending colon polyp   SHOULDER ARTHROSCOPY W/ ROTATOR CUFF REPAIR Right 2006   acromioclavicular joint arthrosis    Current Outpatient Medications  Medication Sig Dispense Refill   acetaminophen  (TYLENOL ) 325 MG tablet Take 650 mg by mouth every 6 (six) hours as needed for moderate pain.     albuterol  (VENTOLIN  HFA) 108 (90 Base) MCG/ACT inhaler INHALE 2 PUFFS BY MOUTH EVERY 6 HOURS AS NEEDED FOR COUGHING, WHEEZING, OR SHORTNESS OF BREATH 20.1 g 0   ALPRAZolam  (XANAX ) 0.5 MG tablet Take one twice a day and two at bedtime 120 tablet 2   amLODipine  (NORVASC ) 10 MG tablet Take 1 tablet by mouth once daily 90 tablet 3   azelastine  (ASTELIN ) 0.1 % nasal spray Place 1 spray into both nostrils 2 (two) times daily as needed. Use in each nostril as directed 30 mL 5   baclofen  (LIORESAL ) 10 MG tablet Take 1 tablet (10 mg total) by mouth 2 (two) times daily. 30 minutes before breakfast and dinner 60 each 1   dronedarone  (MULTAQ ) 400 MG tablet Take 1 tablet (400 mg total) by mouth 2 (two) times daily with a meal. 180 tablet 3   EPINEPHrine  0.3 mg/0.3 mL IJ SOAJ injection Inject 0.3 mg into the muscle as needed for anaphylaxis. 2 each 1   esomeprazole  (NEXIUM ) 40 MG capsule Take 1 capsule (40 mg total) by mouth 2 (two) times daily before a meal. 60 capsule 5   fluticasone  (FLONASE ) 50 MCG/ACT nasal spray Place 2 sprays into both nostrils daily. 16 g 5   KLOR-CON  M20 20 MEQ tablet TAKE 1 TABLET BY MOUTH EVERY DAY 90 tablet 2   metoprolol  succinate (TOPROL  XL) 25 MG 24 hr tablet Take 0.5 tablets (12.5 mg total) by mouth daily. 45 tablet 3   nitroGLYCERIN  (NITROSTAT ) 0.4 MG SL tablet Place 1 tablet (0.4 mg total) under  the tongue every 5 (five) minutes x 3 doses as needed for chest pain (if no relief after 3rd dose, proceed to the ED for an evaluation). 25 tablet 3   rivaroxaban  (XARELTO ) 20 MG TABS tablet TAKE 1 TABLET BY MOUTH ONCE DAILY WITH SUPPER 90 tablet 1   simvastatin  (ZOCOR ) 20 MG tablet Take 1 tablet by mouth once daily 90 tablet 3   triamcinolone  cream (KENALOG ) 0.1 % Apply 1 Application topically 2 (two) times daily. 15 g 2   vitamin B-12 (CYANOCOBALAMIN ) 50 MCG tablet Take 50 mcg by mouth daily.     VITAMIN D  PO Take 1 tablet by mouth daily.     budesonide -formoterol  (SYMBICORT ) 80-4.5 MCG/ACT inhaler Take 2 puffs first thing in am and then another 2 puffs about 12 hours later. (Patient not taking:  Reported on 07/19/2023) 1 each 12   isosorbide  mononitrate (IMDUR ) 30 MG 24 hr tablet Take 1 tablet (30 mg total) by mouth daily. (Patient not taking: Reported on 07/19/2023) 90 tablet 3   No current facility-administered medications for this visit.    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   Dexilant  [dexlansoprazole ] Anaphylaxis 01/20/2015   Mushroom ext cmplx(shiitake-reishi-mait) Anaphylaxis 03/22/2011   Penicillins Anaphylaxis    Doxycycline Nausea And Vomiting 04/18/2016    Family History  Problem Relation Age of Onset   Lung cancer Mother    Alcohol abuse Mother    Heart attack Father 69   Diabetes Father    Alcohol abuse Father    Asthma Sister    Anxiety disorder Sister    Depression Sister    Anxiety disorder Sister    Hypertension Brother    Hypertension Brother    Heart attack Brother 58   Diabetes Brother    Hypertension Brother    Seizures Brother    Dementia Paternal Uncle    ADD / ADHD Daughter    Dementia Cousin    Colon cancer Neg Hx    Drug abuse Neg Hx    Bipolar disorder Neg Hx    OCD Neg Hx    Paranoid behavior Neg Hx    Schizophrenia Neg Hx    Sexual abuse Neg Hx    Physical abuse  Neg Hx     Social History   Socioeconomic History   Marital status: Married    Spouse name: Not on file   Number of children: Not on file   Years of education: Not on file   Highest education level: Not on file  Occupational History   Occupation: full time    Employer: UNEMPLOYED  Tobacco Use   Smoking status: Every Day    Current packs/day: 0.50    Average packs/day: 1 pack/day for 41.0 years (39.7 ttl pk-yrs)    Types: Cigarettes    Start date: 07/17/1982   Smokeless tobacco: Never   Tobacco comments:    1/2 pack a day  Vaping Use   Vaping status: Never Used  Substance and Sexual Activity   Alcohol use: Not Currently   Drug use: No   Sexual activity: Yes    Birth control/protection: None  Other Topics Concern   Not on file  Social History Narrative   Pt lives in [place] with wife.  5 children.  Unemployed due to panic attacks and back pain   Social Drivers of Corporate Investment Banker Strain: Not on file  Food Insecurity: Not on file  Transportation Needs: Not on file  Physical Activity: Not on file  Stress: Not on file  Social Connections: Not on file  Intimate Partner Violence: Not on file     Review of Systems   Gen: Denies any fever, chills, fatigue, weight loss, lack of appetite.  CV: Denies chest pain, heart palpitations, peripheral edema, syncope.  Resp: Denies shortness of breath at rest or with exertion. Denies wheezing or cough.  GI: Denies dysphagia or odynophagia. Denies jaundice, hematemesis, fecal incontinence. GU : Denies urinary burning, urinary frequency, urinary hesitancy MS: Denies joint pain, muscle weakness, cramps, or limitation of movement.  Derm: Denies rash, itching, dry skin Psych: Denies depression, anxiety, memory loss, and confusion Heme: Denies bruising, bleeding, and enlarged lymph nodes.   Physical Exam   BP 119/71   Pulse (!) 57  Temp (!) 97.1 F (36.2 C)   Ht 6' 1 (1.854 m)   Wt 172 lb 6.4 oz (78.2 kg)    BMI 22.75 kg/m  General:   Alert and oriented. Pleasant and cooperative. Well-nourished and well-developed.  Head:  Normocephalic and atraumatic. Eyes:  Without icterus Abdomen:  +BS, soft, non-tender and non-distended. No HSM noted. No guarding or rebound. No masses appreciated.  Rectal:  Deferred  Msk:  Symmetrical without gross deformities. Normal posture. Extremities:  Without edema. Neurologic:  Alert and  oriented x4;  grossly normal neurologically. Skin:  Intact without significant lesions or rashes. Psych:  Alert and cooperative. Normal mood and affect.   Assessment   Richard [name] is a 58 y.o. male presenting today with a history of chronic GERD, abdominal pain, dysphagia, esophageal dysmotility but unable to complete manometry due to transportation.    GERD and dysphagia: actually improved overall. Continues on brand name Nexium  with best results. EGD fairly recent as of May 2024. He would benefit from a manometry, but he does not want to drive to [place]. Will continue supportive measures for now. He is interested in TIF procedure; however, I suspect would need further motility testing.     Last colonoscopy 2018, 1 benign polyp removed, recommended 10-year recall.    PLAN    Continue Nexium  BID Will discuss with Dr. Eartha about TIF candidacy 6 month follow-up   Richard MICAEL Stager, PhD, ANP-BC [facility]

## 2023-07-19 NOTE — Telephone Encounter (Signed)
 This is a [place] pt.

## 2023-07-19 NOTE — Patient Instructions (Signed)
 Continue Nexium  twice a day.   I will ask Dr. Eartha about the procedure!  We will see you in 6 months!  I enjoyed seeing you again today! I value our relationship and want to provide genuine, compassionate, and quality care. You may receive a survey regarding your visit with me, and I welcome your feedback! Thanks so much for taking the time to complete this. I look forward to seeing you again.      Therisa MICAEL Stager, PhD, ANP-BC [facility]

## 2023-07-25 ENCOUNTER — Other Ambulatory Visit: Payer: Self-pay | Admitting: Cardiology

## 2023-07-30 ENCOUNTER — Ambulatory Visit (HOSPITAL_COMMUNITY): Payer: Medicaid Other | Admitting: Clinical

## 2023-07-30 DIAGNOSIS — F331 Major depressive disorder, recurrent, moderate: Secondary | ICD-10-CM

## 2023-07-30 DIAGNOSIS — F4310 Post-traumatic stress disorder, unspecified: Secondary | ICD-10-CM

## 2023-07-30 DIAGNOSIS — F4001 Agoraphobia with panic disorder: Secondary | ICD-10-CM | POA: Diagnosis not present

## 2023-07-30 DIAGNOSIS — F419 Anxiety disorder, unspecified: Secondary | ICD-10-CM | POA: Diagnosis not present

## 2023-07-30 NOTE — Progress Notes (Signed)
 Virtual Visit via Video Note   I connected with Richard [name] on 07/30/23 at  1:00 PM EDT by a video enabled telemedicine application and verified that I am speaking with the correct person using two identifiers.   Location: Patient: home Provider: office   I discussed the limitations of evaluation and management by telemedicine and the availability of in person appointments. The patient expressed understanding and agreed to proceed.     THERAPIST PROGRESS NOTE     Session Time: 1:00 PM-1:45 PM   Participation Level: Active   Behavioral Response: Casual and Alert,Anxious   Type of Therapy: Individual Therapy   Treatment Goals addressed: Mood and Anxiety   Interventions: CBT   Summary: Richard [name] is a 58 y.o. male who presents with panic disorder/depression with anxiety/and PTSD . The OPT therapist worked with the patient for his OPT treatment. The OPT therapist utilized Motivational Interviewing to assist in creating therapeutic repore. The patient in the session was engaged and work in collaboration giving feedback about his triggers and symptoms over the past few weeks over the course of the holidays and into the new year.The patient spoke about adjustment through the Winter months and weather in the local area (snow). The OPT therapist utilized Cognitive Behavioral Therapy through cognitive restructuring as well as worked with the patient on coping strategies to assist in management of symptoms as well as reviewed sleep, eating habits, and general health. The patient continues to work on acceptance of limited mobility based on health conditions.The OPT therapist overviewed upcoming health appointments as listed in the patients MyChart.   Suicidal/Homicidal: Nowithout intent/plan   Therapist Response:The OPT therapist worked with the patient for the patients scheduled session. The patient was engaged in his session and gave feedback in relation to triggers, symptoms, and behavior  responses over the past few weeks. The OPT therapist worked with the patient utilizing an in session Cognitive Behavioral Therapy exercise. The patient was responsive in the session and verbalized,  I have been trying not to push past my limits , we have been doing ok with just holding things together staying in from the weather and trying to stay warm .The OPT therapist worked with the patient providing ongoing psychotherapy/education and coping skills review including adaptations for the Winter season.The patient spoke about looking forward to the Spring season. The patient spoke about upcoming medical appointments through January.The OPT therapist provided psycho-education throughout the session. The OPT therapist will continue treatment work with the patient in his next scheduled session   Plan: Return again in 2/3 weeks.   Diagnosis:      Axis I: PTSD/Panic Disorder/ Depression with anxiety  Axis II: No diagnosis      Collaboration of Care: No additional collaboration of care for this session.   Patient/Guardian was advised Release of Information must be obtained prior to any record release in order to collaborate their care with an outside provider. Patient/Guardian was advised if they have not already done so to contact the registration department to sign all necessary forms in order for us  to release information regarding their care.    Consent: Patient/Guardian gives verbal consent for treatment and assignment of benefits for services provided during this visit. Patient/Guardian expressed understanding and agreed to proceed.    I discussed the assessment and treatment plan with the patient. The patient was provided an opportunity to ask questions and all were answered. The patient agreed with the plan and demonstrated an understanding of the instructions.  The patient was advised to call back or seek an in-person evaluation if the symptoms worsen or if the condition fails to improve as  anticipated.   I provided 30 minutes of non-face-to-face time during this encounter.   Richard ONEIDA Pepper, LCSW    07/30/2023

## 2023-08-01 ENCOUNTER — Ambulatory Visit: Payer: Medicaid Other | Attending: Student | Admitting: Student

## 2023-08-01 ENCOUNTER — Encounter: Payer: Self-pay | Admitting: Student

## 2023-08-01 VITALS — BP 136/72 | HR 56 | Ht 72.0 in | Wt 174.8 lb

## 2023-08-01 DIAGNOSIS — I480 Paroxysmal atrial fibrillation: Secondary | ICD-10-CM | POA: Diagnosis not present

## 2023-08-01 DIAGNOSIS — E785 Hyperlipidemia, unspecified: Secondary | ICD-10-CM

## 2023-08-01 DIAGNOSIS — I2510 Atherosclerotic heart disease of native coronary artery without angina pectoris: Secondary | ICD-10-CM | POA: Diagnosis not present

## 2023-08-01 DIAGNOSIS — I6523 Occlusion and stenosis of bilateral carotid arteries: Secondary | ICD-10-CM

## 2023-08-01 DIAGNOSIS — I10 Essential (primary) hypertension: Secondary | ICD-10-CM | POA: Diagnosis not present

## 2023-08-01 NOTE — Patient Instructions (Signed)
 Medication Instructions:   Would recommend starting Imdur  if recurrent chest pain. Can start at 15mg  daily (half-tablet).   *If you need a refill on your cardiac medications before your next appointment, please call your pharmacy*    Follow-Up: At [facility], you and your health needs are our priority.  As part of our continuing mission to provide you with exceptional heart care, we have created designated Provider Care Teams.  These Care Teams include your primary Cardiologist (physician) and Advanced Practice Providers (APPs -  Physician Assistants and Nurse Practitioners) who all work together to provide you with the care you need, when you need it.  We recommend signing up for the patient portal called "MyChart".  Sign up information is provided on this After Visit Summary.  MyChart is used to connect with patients for Virtual Visits (Telemedicine).  Patients are able to view lab/test results, encounter notes, upcoming appointments, etc.  Non-urgent messages can be sent to your provider as well.   To learn more about what you can do with MyChart, go to [email or web address].    Your next appointment:   4-5 months  Provider:   You may see Teddie Favre, MD or one of the following Advanced Practice Providers on your designated Care Team:   [place], PA-C  Theotis Flake, PA-C

## 2023-08-01 NOTE — Progress Notes (Signed)
 Cardiology Office Note    Date:  08/01/2023  ID:  Richard [name], DOB [birth year], MRN [id number] Cardiologist: Teddie Favre, MD    History of Present Illness:    Richard [name] is a 58 y.o. male with past medical history of CAD (nonobstructive disease by cath in 07/2015, low-risk NST in 12/2020), paroxysmal atrial fibrillation, carotid artery disease, HTN, HLD, asthma, anxiety and depression who presents to the office today for 10-month follow-up.  He was examined by myself in 04/2023 and had recently been evaluated in the ED for chest pain and cardiac enzymes were negative and EKG showed no acute ST changes. He did report having intermittent episodes of chest pain and some episodes would occur with food consumption and others would occur with activity. Options for further evaluation were reviewed and given that prior cardiac catheterization in 2017 had shown up to 65% stenosis, a Coronary CTA was recommended for further assessment.  His Coronary CTA showed scattered calcified plaque with most significant being 50 to 69% stenosis along the D1 and proximal RCA. This was sent for FFR and was positive in the most distal RCA and LAD. Findings were reviewed with Dr. Londa Rival and given the FFR was only positive in the distal vessels, it was recommended to try medical therapy initially and if he had refractory symptoms, could consider a repeat cardiac catheterization in the future. This was reviewed with the patient and he was started on Imdur  30 mg daily while being continued on statin therapy. He was not on ASA given the need for anticoagulation.  In talking the patient and his wife today, he denies any recurrent chest pain resembling what he was experiencing at the time of his last office visit. Says that he did not start Imdur  given no recurrent symptoms.  Does report having an odd sensation along his chest last week after cleaning snow off of their cars but this was constant and lasted for over 2  days. Was not associated with exertion. They do walk around [employer] a few days a week for exercise for up to 2 hours at a time and he denies any exertional chest pain with this.  Reports activity is limited by back pain. No recent orthopnea, PND or pitting edema.  Studies Reviewed:   EKG: EKG is not ordered today.  Coronary CTA: 04/2023 FINDINGS: Non-cardiac: See separate report from [facility]. No significant findings on limited lung and soft tissue windows.   Calcium  Score: 3 vessel calcium  noted   LM 0   LAD 183   RCA 139   LCX 38.9   Total:  361   Coronary Arteries: Right dominant with no anomalies   LM: Normal   LAD: 25-49% calcified proximal disease. 50-69% calcified plaque in mid and distal vessel   D1: 25-49% calcified plaque ostium 50-69% calcified plaque in mid vessel   D2: Small vessel not well seen   Circumflex: 1-24% calcified plaque ostium. 25-49% calcified plaque in mid/distal vessel prior to OM take off   OM1: Normal   OM2: Normal   RCA: 50-69% proximal and distal vessel 25-49% mid vessel   PDA: Normal   PLA: 25-49% calcified plaque   IMPRESSION: 1. Three vessel calcium  noted score 361 which is 90 th percentile for age/sex   2.  Normal ascending thoracic aorta 3.0 cm   3. CAD RADS 3 possible obstructive CAD in LAD/RCA study sent for [facility]   Janelle Mediate   Electronically Signed: By: Delcia Feeling.D.  On: 05/09/2023 17:31  FINDINGS: FFR CT positive in most distal RCA   RCA proximal 0.98, mid 0.81 distal 0.69   FFR CT positive in most distal LAD   LAD 0.96 proximal, 0.76 mid and 0.65 distal   FFR CT negative in LCX   LCX 0.98 proximal, 0.89 mid and 0.79 distal   IMPRESSION: FFR CT positive in most distal RCA and LAD   Risk Assessment/Calculations:    CHA2DS2-VASc Score = 2   This indicates a 2.2% annual risk of stroke. The patient's score is based upon: CHF History: 0 HTN History: 1 Diabetes History: 0 Stroke  History: 0 Vascular Disease History: 1 Age Score: 0 Gender Score: 0    STOP-Bang Score:  6        Physical Exam:   VS:  BP 136/72 (BP Location: Right Arm, Patient Position: Sitting, Cuff Size: Normal)   Pulse (!) 56   Ht 6' (1.829 m)   Wt 174 lb 12.8 oz (79.3 kg)   SpO2 98%   BMI 23.71 kg/m    Wt Readings from Last 3 Encounters:  08/01/23 174 lb 12.8 oz (79.3 kg)  07/19/23 172 lb 6.4 oz (78.2 kg)  07/04/23 172 lb (78 kg)     GEN: Well nourished, well developed male appearing in no acute distress NECK: No JVD; No carotid bruits CARDIAC: RRR, no murmurs, rubs, gallops RESPIRATORY:  Clear to auscultation without rales, wheezing or rhonchi  ABDOMEN: Appears non-distended. No obvious abdominal masses. EXTREMITIES: No clubbing or cyanosis. No pitting edema.  Distal pedal pulses are 2+ bilaterally.   Assessment and Plan:   1. CAD - He had moderate plaque by cardiac catheterization in 2017 and recent Cardiac CTA in 04/2023 showed up to 50 to 69% stenosis as discussed above and FFR was only positive in the distal vessels with medical management recommended at that time.  - He denies any recurrent exertional chest pain and reviewed that if he has symptoms in the future, would recommend starting Imdur  30 mg daily as previously recommended. He wishes to hold off on additional medical therapy for now given no recurrent symptoms which is certainly reasonable. Continue Toprol -XL 12.5 mg daily and Simvastatin  20 mg daily. He is not on ASA given the need for anticoagulation.  2. Paroxysmal Atrial Fibrillation - He reports occasional palpitations but no persistent symptoms. Will continue Toprol -XL 12.5 mg daily and he also remains on Multaq  400 mg twice daily (per Dr. Carolynne Citron). TSH and LFT's were WNL when checked in 06/2023. - No reports of active bleeding. Continue Xarelto  20 mg daily for anticoagulation  3. HTN - Blood pressure is at 136/72 during today's visit. Continue Amlodipine  10 mg  daily and Toprol -XL 12.5 mg daily.  4. HLD - LDL was at 54 when checked in 01/2023. Continue current medical therapy with Simvastatin  20 mg daily.  Would not further titrate given the concurrent use of Multaq .  5. Carotid Artery Stenosis  - Dopplers in 2022 showed 1 to 49% stenosis and CTA Neck in 2023 showed no significant stenosis.  Continue statin therapy.  Signed, Dorma Gash, PA-C

## 2023-08-06 ENCOUNTER — Emergency Department (HOSPITAL_COMMUNITY)
Admission: EM | Admit: 2023-08-06 | Discharge: 2023-08-07 | Disposition: A | Discharge status: Home / Self Care | Payer: Medicaid Other | Attending: Emergency Medicine | Admitting: Emergency Medicine

## 2023-08-06 ENCOUNTER — Emergency Department (HOSPITAL_COMMUNITY): Payer: Medicaid Other

## 2023-08-06 ENCOUNTER — Encounter (HOSPITAL_COMMUNITY): Payer: Self-pay

## 2023-08-06 ENCOUNTER — Other Ambulatory Visit: Payer: Self-pay

## 2023-08-06 DIAGNOSIS — J449 Chronic obstructive pulmonary disease, unspecified: Secondary | ICD-10-CM | POA: Diagnosis not present

## 2023-08-06 DIAGNOSIS — I499 Cardiac arrhythmia, unspecified: Secondary | ICD-10-CM | POA: Diagnosis not present

## 2023-08-06 DIAGNOSIS — Z7901 Long term (current) use of anticoagulants: Secondary | ICD-10-CM | POA: Diagnosis not present

## 2023-08-06 DIAGNOSIS — R079 Chest pain, unspecified: Secondary | ICD-10-CM

## 2023-08-06 DIAGNOSIS — Z79899 Other long term (current) drug therapy: Secondary | ICD-10-CM | POA: Insufficient documentation

## 2023-08-06 DIAGNOSIS — Z743 Need for continuous supervision: Secondary | ICD-10-CM | POA: Diagnosis not present

## 2023-08-06 DIAGNOSIS — I4891 Unspecified atrial fibrillation: Secondary | ICD-10-CM | POA: Diagnosis not present

## 2023-08-06 DIAGNOSIS — I2510 Atherosclerotic heart disease of native coronary artery without angina pectoris: Secondary | ICD-10-CM | POA: Insufficient documentation

## 2023-08-06 DIAGNOSIS — I10 Essential (primary) hypertension: Secondary | ICD-10-CM | POA: Diagnosis not present

## 2023-08-06 DIAGNOSIS — E119 Type 2 diabetes mellitus without complications: Secondary | ICD-10-CM | POA: Insufficient documentation

## 2023-08-06 DIAGNOSIS — R0789 Other chest pain: Secondary | ICD-10-CM | POA: Diagnosis not present

## 2023-08-06 DIAGNOSIS — R002 Palpitations: Secondary | ICD-10-CM | POA: Diagnosis not present

## 2023-08-06 LAB — CBC
HCT: 42.8 % (ref 39.0–52.0)
Hemoglobin: 15 g/dL (ref 13.0–17.0)
MCH: 31.9 pg (ref 26.0–34.0)
MCHC: 35 g/dL (ref 30.0–36.0)
MCV: 91.1 fL (ref 80.0–100.0)
Platelets: 183 10*3/uL (ref 150–400)
RBC: 4.7 MIL/uL (ref 4.22–5.81)
RDW: 12.3 % (ref 11.5–15.5)
WBC: 7.4 10*3/uL (ref 4.0–10.5)
nRBC: 0 % (ref 0.0–0.2)

## 2023-08-06 LAB — BASIC METABOLIC PANEL
Anion gap: 9 (ref 5–15)
BUN: 16 mg/dL (ref 6–20)
CO2: 25 mmol/L (ref 22–32)
Calcium: 8.9 mg/dL (ref 8.9–10.3)
Chloride: 104 mmol/L (ref 98–111)
Creatinine, Ser: 0.86 mg/dL (ref 0.61–1.24)
GFR, Estimated: >60 mL/min (ref >60)
Glucose, Bld: 156 mg/dL — ABNORMAL HIGH (ref 70–99)
Potassium: 3.4 mmol/L — ABNORMAL LOW (ref 3.5–5.1)
Sodium: 138 mmol/L (ref 135–145)

## 2023-08-06 LAB — MAGNESIUM: Magnesium: 2.1 mg/dL (ref 1.7–2.4)

## 2023-08-06 LAB — TSH: TSH: 1.595 u[IU]/mL (ref 0.350–4.500)

## 2023-08-06 LAB — TROPONIN I (HIGH SENSITIVITY): Troponin I (High Sensitivity): 3 ng/L (ref <18)

## 2023-08-06 MED ORDER — POTASSIUM CHLORIDE CRYS ER 20 MEQ PO TBCR
40.0000 meq | EXTENDED_RELEASE_TABLET | Freq: Once | ORAL | Status: DC
  Filled 2023-08-06: qty 2

## 2023-08-06 MED ORDER — POTASSIUM CHLORIDE 20 MEQ PO PACK
60.0000 meq | PACK | ORAL | Status: AC
  Administered 2023-08-06: 60 meq via ORAL
  Filled 2023-08-06: qty 3

## 2023-08-06 MED ORDER — ASPIRIN 81 MG PO CHEW
324.0000 mg | CHEWABLE_TABLET | ORAL | Status: AC
  Administered 2023-08-06: 324 mg via ORAL
  Filled 2023-08-06: qty 4

## 2023-08-06 NOTE — ED Provider Notes (Signed)
[place] [facility] Provider Note   CSN: [id number] Arrival date & time: 08/06/23  2134     History {Add pertinent medical, surgical, social history, OB history to HPI:1} No chief complaint on file.   Richard [name] is a 58 y.o. male.  58 year old male with history of paroxysmal atrial fibrillation on Xarelto, CAD managed medically, COPD, GERD, diabetes, HLD, and HTN who presents emergency department chest discomfort.  Patient reports that tonight at 8:30 PM he was watching television when he started feeling his heart was beating out of his chest.  This lasted for approximately an hour and then felt like he went back into normal sinus rhythm.  Says that during that time he started having pressure-like sensation in the left side of his chest.  Also got diaphoretic.  No vomiting.  Says that when he got up to walk around symptoms got worse.  Also had some mild shortness of breath that has resolved.       Home Medications Prior to Admission medications   Medication Sig Start Date End Date Taking? Authorizing Provider  acetaminophen (TYLENOL) 325 MG tablet Take 650 mg by mouth every 6 (six) hours as needed for moderate pain.    [provider]  albuterol (VENTOLIN HFA) 108 (90 Base) MCG/ACT inhaler INHALE 2 PUFFS BY MOUTH EVERY 6 HOURS AS NEEDED FOR COUGHING, WHEEZING, OR SHORTNESS OF BREATH 06/19/22   Jacquelin Hawking, PA-C  ALPRAZolam Prudy Feeler) 0.5 MG tablet Take one twice a day and two at bedtime 05/18/23   Myrlene Broker, MD  amLODipine (NORVASC) 10 MG tablet Take 1 tablet by mouth once daily 12/07/22   Jonelle Sidle, MD  azelastine (ASTELIN) 0.1 % nasal spray Place 1 spray into both nostrils 2 (two) times daily as needed. Use in each nostril as directed Patient not taking: Reported on 08/01/2023 03/26/23   Birder Robson, MD  baclofen (LIORESAL) 10 MG tablet Take 1 tablet (10 mg total) by mouth 2 (two) times daily. 30 minutes before breakfast  and dinner Patient not taking: Reported on 08/01/2023 01/25/23   Gelene Mink, NP  budesonide-formoterol [facility]) 80-4.5 MCG/ACT inhaler Take 2 puffs first thing in am and then another 2 puffs about 12 hours later. 01/02/23   Nyoka Cowden, MD  dronedarone (MULTAQ) 400 MG tablet Take 1 tablet (400 mg total) by mouth 2 (two) times daily with a meal. 05/18/23 05/18/24  Marinus Maw, MD  EPINEPHrine 0.3 mg/0.3 mL IJ SOAJ injection Inject 0.3 mg into the muscle as needed for anaphylaxis. 03/26/23   Birder Robson, MD  esomeprazole (NEXIUM) 40 MG capsule Take 1 capsule (40 mg total) by mouth 2 (two) times daily before a meal. 05/10/23   Gelene Mink, NP  esomeprazole (NEXIUM) 40 MG capsule Take 1 capsule (40 mg total) by mouth 2 (two) times daily before a meal. 07/19/23   Gelene Mink, NP  fluticasone (FLONASE) 50 MCG/ACT nasal spray Place 2 sprays into both nostrils daily. 03/26/23   Birder Robson, MD  isosorbide mononitrate (IMDUR) 30 MG 24 hr tablet Take 1 tablet (30 mg total) by mouth daily. Patient not taking: Reported on 08/01/2023 05/10/23   Ellsworth Lennox, PA-C  KLOR-CON M20 20 MEQ tablet TAKE 1 TABLET BY MOUTH EVERY DAY 07/19/23   Laurann Montana, PA-C  metoprolol succinate (TOPROL-XL) 25 MG 24 hr tablet TAKE 1/2 TABLET BY MOUTH EVERY DAY 07/25/23   Jonelle Sidle, MD  nitroGLYCERIN (NITROSTAT) 0.4 MG SL tablet Place 1 tablet (0.4 mg total) under the tongue every 5 (five) minutes x 3 doses as needed for chest pain (if no relief after 3rd dose, proceed to the ED for an evaluation). 07/26/22   Marinus Maw, MD  rivaroxaban (XARELTO) 20 MG TABS tablet TAKE 1 TABLET BY MOUTH ONCE DAILY WITH SUPPER 03/12/23   Jonelle Sidle, MD  simvastatin (ZOCOR) 20 MG tablet Take 1 tablet by mouth once daily 06/22/23   Marinus Maw, MD  triamcinolone cream (KENALOG) 0.1 % Apply 1 Application topically 2 (two) times daily. 06/18/23   Gilmore Laroche, FNP  vitamin B-12 (CYANOCOBALAMIN) 50 MCG tablet Take 50  mcg by mouth daily.    [provider]  VITAMIN D PO Take 1 tablet by mouth daily.    [provider]      Allergies    Alpha-gal, Dexilant [dexlansoprazole], Mushroom ext cmplx(shiitake-reishi-mait), Penicillins, and Doxycycline    Review of Systems   Review of Systems  Physical Exam Updated Vital Signs BP 123/69   Pulse 62   Temp 98.2 F (36.8 C)   Resp 13   SpO2 98%  Physical Exam Vitals and nursing note reviewed.  Constitutional:      General: He is not in acute distress.    Appearance: He is well-developed.  HENT:     Head: Normocephalic and atraumatic.     Right Ear: External ear normal.     Left Ear: External ear normal.     Nose: Nose normal.  Eyes:     Extraocular Movements: Extraocular movements intact.     Conjunctiva/sclera: Conjunctivae normal.     Pupils: Pupils are equal, round, and reactive to light.  Cardiovascular:     Rate and Rhythm: Normal rate and regular rhythm.     Heart sounds: Normal heart sounds.     Comments: Chest pain not reproducible Pulmonary:     Effort: Pulmonary effort is normal. No respiratory distress.     Breath sounds: Normal breath sounds.  Musculoskeletal:     Cervical back: Normal range of motion and neck supple.     Right lower leg: No edema.     Left lower leg: No edema.  Skin:    General: Skin is warm and dry.  Neurological:     Mental Status: He is alert. Mental status is at baseline.  Psychiatric:        Mood and Affect: Mood normal.        Behavior: Behavior normal.     ED Results / Procedures / Treatments   Labs (all labs ordered are listed, but only abnormal results are displayed) Labs Reviewed - No data to display  EKG None  Radiology No results found.  Procedures Procedures  {Document cardiac monitor, telemetry assessment procedure when appropriate:1}  Medications Ordered in ED Medications - No data to display  ED Course/ Medical Decision Making/ A&P   {   Click here for  ABCD2, HEART and other calculatorsREFRESH Note before signing :1}                              Medical Decision Making Amount and/or Complexity of Data Reviewed Labs: ordered. Radiology: ordered.  Risk OTC drugs.   ***  {Document critical care time when appropriate:1} {Document review of labs and clinical decision tools ie heart score, Chads2Vasc2 etc:1}  {Document your independent review of radiology images, and  any outside records:1} {Document your discussion with family members, caretakers, and with consultants:1} {Document social determinants of health affecting pt's care:1} {Document your decision making why or why not admission, treatments were needed:1} Final Clinical Impression(s) / ED Diagnoses Final diagnoses:  None    Rx / DC Orders ED Discharge Orders     None

## 2023-08-06 NOTE — ED Triage Notes (Signed)
Pt bib [facility] c/o chest pressure and A-fib that started tonight at 630. Pt with hx of same. Pt is on blood thinner. Rate between 70-110.

## 2023-08-07 LAB — TROPONIN I (HIGH SENSITIVITY): Troponin I (High Sensitivity): 8 ng/L (ref <18)

## 2023-08-07 NOTE — ED Provider Notes (Signed)
Patient signed out to me by Dr. Eloise Harman to follow-up on second troponin.  Patient has a history of paroxysmal atrial fibrillation, is anticoagulated on Xarelto.  Patient had an episode of fast and irregular heartbeat that lasted about an hour earlier today.  He is in sinus rhythm here.  He had some chest discomfort which has resolved.  Troponins are not significantly elevated.  No obvious changes on EKG.  Will be appropriate for further outpatient management by cardiology.   Gilda Crease, MD 08/07/23 [phone]

## 2023-08-07 NOTE — Discharge Instructions (Signed)
You were seen for your chest pain and atrial fibrillation in the emergency department.   At home, please take your medications as prescribed.    Follow-up with your primary doctor in 2-3 days regarding your visit.  Cardiology will be calling you regarding an appointment within the next 72 hours.  You may contact them if you do not hear from them in that time using the information in this packet.  Return immediately to the emergency department if you experience any of the following: Worsening pain, difficulty breathing, unexplained vomiting or sweating, or any other concerning symptoms.    Thank you for visiting our Emergency Department. It was a pleasure taking care of you today.

## 2023-08-08 ENCOUNTER — Telehealth: Payer: Self-pay

## 2023-08-08 NOTE — Transitions of Care (Post Inpatient/ED Visit) (Signed)
08/08/2023  Name: Richard [name] MRN: [id number] DOB: Jun 25, 1966  Today's TOC FU Call Status: Today's TOC FU Call Status:: Successful TOC FU Call Completed TOC FU Call Complete Date: 08/08/23 Patient's Name and Date of Birth confirmed.  Transition Care Management Follow-up Telephone Call Date of Discharge: 08/07/23 [facility] Type of Discharge: Emergency Department Reason for ED Visit: Cardiac Conditions (Atrial fibrillation, unspecified type) Cardiac Conditions Diagnosis: Atrial Fibrillation How have you been since you were released from the hospital?: Better Any questions or concerns?: No  Items Reviewed: Did you receive and understand the discharge instructions provided?: Yes Medications obtained,verified, and reconciled?: Yes (Medications Reviewed) Any new allergies since your discharge?: No Dietary orders reviewed?: NA Do you have support at home?: Yes People in Home: spouse  Medications Reviewed Today: Medications Reviewed Today     Reviewed by Leigh Aurora, CMA (Certified Medical Assistant) on 08/08/23 at 1357  Med List Status: <None>   Medication Order Taking? Sig Documenting Provider Last Dose Status Informant  acetaminophen (TYLENOL) 325 MG tablet [id number] No Take 650 mg by mouth every 6 (six) hours as needed for moderate pain. [provider] Taking Active Pharmacy Records, Self  albuterol (VENTOLIN HFA) 108 (90 Base) MCG/ACT inhaler [id number] No INHALE 2 PUFFS BY MOUTH EVERY 6 HOURS AS NEEDED FOR COUGHING, WHEEZING, OR SHORTNESS OF Devota Pace, PA-C Taking Active Pharmacy Records, Self  ALPRAZolam Prudy Feeler) 0.5 MG tablet [id number] No Take one twice a day and two at bedtime Myrlene Broker, MD Taking Active   amLODipine (NORVASC) 10 MG tablet [id number] No Take 1 tablet by mouth once daily Jonelle Sidle, MD Taking Active Pharmacy Records, Self  azelastine (ASTELIN) 0.1 % nasal spray [id number] No Place 1 spray  into both nostrils 2 (two) times daily as needed. Use in each nostril as directed  Patient not taking: Reported on 08/01/2023   Birder Robson, MD Not Taking Active   baclofen (LIORESAL) 10 MG tablet [id number] No Take 1 tablet (10 mg total) by mouth 2 (two) times daily. 30 minutes before breakfast and dinner  Patient not taking: Reported on 08/01/2023   Gelene Mink, NP Not Taking Active   budesonide-formoterol [facility]) 80-4.5 MCG/ACT inhaler [id number] No Take 2 puffs first thing in am and then another 2 puffs about 12 hours later. Nyoka Cowden, MD Taking Active   dronedarone (MULTAQ) 400 MG tablet [id number] No Take 1 tablet (400 mg total) by mouth 2 (two) times daily with a meal. Marinus Maw, MD Taking Active   EPINEPHrine 0.3 mg/0.3 mL IJ SOAJ injection [id number] No Inject 0.3 mg into the muscle as needed for anaphylaxis. Birder Robson, MD Taking Active   esomeprazole (NEXIUM) 40 MG capsule [id number] No Take 1 capsule (40 mg total) by mouth 2 (two) times daily before a meal. Gelene Mink, NP Taking Active   esomeprazole (NEXIUM) 40 MG capsule [id number] No Take 1 capsule (40 mg total) by mouth 2 (two) times daily before a meal. Gelene Mink, NP Taking Active   fluticasone (FLONASE) 50 MCG/ACT nasal spray [id number] No Place 2 sprays into both nostrils daily. Birder Robson, MD Taking Active   isosorbide mononitrate (IMDUR) 30 MG 24 hr tablet [id number] No Take 1 tablet (30 mg total) by mouth daily.  Patient not taking: Reported on 08/01/2023   Ellsworth Lennox, PA-C Not Taking Active            Med Note [place], [facility]  Sherrie Sport Jul 19, 2023  2:06 PM) Has not started yet as of 07/18/22  KLOR-CON M20 20 MEQ tablet [id number] No TAKE 1 TABLET BY MOUTH EVERY DAY Dunn, Tacey Ruiz, PA-C Taking Active   metoprolol succinate (TOPROL-XL) 25 MG 24 hr tablet [id number] No TAKE 1/2 TABLET BY MOUTH EVERY DAY Jonelle Sidle, MD Taking Active   nitroGLYCERIN (NITROSTAT) 0.4 MG SL tablet [id number] No  Place 1 tablet (0.4 mg total) under the tongue every 5 (five) minutes x 3 doses as needed for chest pain (if no relief after 3rd dose, proceed to the ED for an evaluation). Marinus Maw, MD Taking Active Pharmacy Records, Self  rivaroxaban Carlena Hurl) 20 MG TABS tablet [id number] No TAKE 1 TABLET BY MOUTH ONCE DAILY WITH SUPPER Jonelle Sidle, MD Taking Active   simvastatin (ZOCOR) 20 MG tablet [id number] No Take 1 tablet by mouth once daily Marinus Maw, MD Taking Active   triamcinolone cream (KENALOG) 0.1 % [id number] No Apply 1 Application topically 2 (two) times daily. Gilmore Laroche, FNP Taking Active   vitamin B-12 (CYANOCOBALAMIN) 50 MCG tablet [id number] No Take 50 mcg by mouth daily. [provider] Taking Active Pharmacy Records, Self  VITAMIN D PO [id number] No Take 1 tablet by mouth daily. [provider] Taking Active Pharmacy Records, Self            Home Care and Equipment/Supplies: Were Home Health Services Ordered?: NA Any new equipment or medical supplies ordered?: NA  Functional Questionnaire: Do you need assistance with bathing/showering or dressing?: No Do you need assistance with meal preparation?: No Do you need assistance with eating?: No Do you have difficulty maintaining continence: No Do you need assistance with getting out of bed/getting out of a chair/moving?: No Do you have difficulty managing or taking your medications?: No  Follow up appointments reviewed: PCP Follow-up appointment confirmed?: Yes Date of PCP follow-up appointment?: 08/14/23 Follow-up Provider: Gilmore Laroche, FNP Specialist Hospital Follow-up appointment confirmed?: No Reason Specialist Follow-Up Not Confirmed: Patient has Specialist Provider Number and will Call for Appointment Do you need transportation to your follow-up appointment?: No Do you understand care options if your condition(s) worsen?: Yes-patient verbalized understanding    SIGNATURE Agnes Lawrence, CMA (AAMA)  [facility] [phone]

## 2023-08-09 ENCOUNTER — Encounter: Payer: Self-pay | Admitting: Urology

## 2023-08-09 ENCOUNTER — Ambulatory Visit: Payer: Medicaid Other | Admitting: Urology

## 2023-08-09 VITALS — BP 119/71 | HR 58

## 2023-08-09 DIAGNOSIS — E291 Testicular hypofunction: Secondary | ICD-10-CM | POA: Diagnosis not present

## 2023-08-09 DIAGNOSIS — N4341 Spermatocele of epididymis, single: Secondary | ICD-10-CM | POA: Diagnosis not present

## 2023-08-09 DIAGNOSIS — R634 Abnormal weight loss: Secondary | ICD-10-CM | POA: Diagnosis not present

## 2023-08-09 DIAGNOSIS — N5201 Erectile dysfunction due to arterial insufficiency: Secondary | ICD-10-CM | POA: Diagnosis not present

## 2023-08-09 LAB — URINALYSIS, ROUTINE W REFLEX MICROSCOPIC
Bilirubin, UA: NEGATIVE
Glucose, UA: NEGATIVE
Leukocytes,UA: NEGATIVE
Nitrite, UA: NEGATIVE
Specific Gravity, UA: 1.03 (ref 1.005–1.030)
Urobilinogen, Ur: 0.2 mg/dL (ref 0.2–1.0)
pH, UA: 6 (ref 5.0–7.5)

## 2023-08-09 LAB — MICROSCOPIC EXAMINATION
Bacteria, UA: NONE SEEN
WBC, UA: NONE SEEN /[HPF] (ref 0–5)

## 2023-08-09 NOTE — Progress Notes (Signed)
Subjective: 1. Hypogonadism in male   2. Abnormal weight loss   3. Erectile dysfunction due to arterial insufficiency   4. Spermatocele of epididymis, single      Consult requested by Gilmore Laroche FNP.  08/09/23: Richard [name] is a 58 yo male who has lost 60 lb over about 6 month period last year.  He has had an extensive w/u and no clear cause was found.  He feels he loss muscle mass.  He has progressive ED over the last year.  He has a good libido.  He has had no testicular atrophy but he has found a knot in the left testicle.  He had a scrotal US that showed some cysts several years go.  He is voiding ok with some frequency and a reduced stream. He has nocturia x 1.  His PSA has been low with the last 0.5.   ROS:  Review of Systems  HENT:  Positive for congestion.   Respiratory:  Positive for cough.   Cardiovascular:  Positive for chest pain.  Gastrointestinal:  Positive for heartburn.  Musculoskeletal:  Positive for back pain and joint pain.  Skin:  Positive for itching.  Neurological:  Positive for weakness.  Psychiatric/Behavioral:  Positive for memory loss. The patient is nervous/anxious.   All other systems reviewed and are negative.   Allergies  Allergen Reactions   Alpha-Gal Anaphylaxis   Dexilant [Dexlansoprazole] Anaphylaxis   Mushroom Ext Cmplx(Shiitake-Reishi-Mait) Anaphylaxis    Rapid heart rate.   Penicillins Anaphylaxis    Has patient had a PCN reaction causing immediate rash, facial/tongue/throat swelling, SOB or lightheadedness with hypotension: Yes Has patient had a PCN reaction causing severe rash involving mucus membranes or skin necrosis: No Has patient had a PCN reaction that required hospitalization Yes Has patient had a PCN reaction occurring within the last 10 years: No If all of the above answers are "NO", then may proceed with Cephalosporin use.    Doxycycline Nausea And Vomiting         Past Medical History:  Diagnosis Date   Arthritis    Asthma     CAD (coronary artery disease)    a. Cardiac cath 07/2015 showed 65% distal Cx, 20% mid-distal LAD, 20% prox-distal RCA, EF 60%, EDP .   Colitis 1990   COPD (chronic obstructive pulmonary disease) (HCC)    Depression    Dysrhythmia    Essential hypertension    Fatty liver    Gastric ulcer 2003; 2012   2003: + esophagitis; negative H.pylori serology  2012: Dr. Darrick Penna, mild gastritis, Bravo PH probe placement, negative H.pylori   GERD (gastroesophageal reflux disease)    Hepatic steatosis    History of hiatal hernia    Hyperlipemia    Overweight    Panic attacks    Paroxysmal atrial fibrillation (HCC)    Pulmonary nodules    Stroke (HCC)    TIA (transient ischemic attack)    Type II diabetes mellitus (HCC)     Past Surgical History:  Procedure Laterality Date   BALLOON DILATION N/A 07/25/2021   Procedure: BALLOON DILATION;  Surgeon: Lanelle Bal, DO;  [facility];  Service: Endoscopy;  Laterality: N/A;   BIOPSY  07/25/2021   Procedure: BIOPSY;  Surgeon: Lanelle Bal, DO;  [facility];  Service: Endoscopy;;   BIOPSY  12/04/2022   Procedure: BIOPSY;  Surgeon: Lanelle Bal, DO;  [facility];  Service: Endoscopy;;   BRAVO PH STUDY  05/03/2011  WUJ:WJXB gastritis/normal esophagus and duodenum   CARDIAC CATHETERIZATION  1990s X 1; 2005; 08/12/2015   CARDIAC CATHETERIZATION N/A 08/12/2015   Procedure: Left Heart Cath and Coronary Angiography;  Surgeon: Lyn Records, MD; LAD 20%, CFX 65%, RCA 20%, EF 60%    COLONOSCOPY  1990   COLONOSCOPY WITH PROPOFOL N/A 11/21/2016   Dr. Darrick Penna: non-thrombosed external hemorrhoids, one 6 mm polyp (polypoid lesion), internal hemorrhoids. TI Normal. 10 years screening   ESOPHAGOGASTRODUODENOSCOPY  05/03/2011   JYN:WGNF gastritis   ESOPHAGOGASTRODUODENOSCOPY (EGD) WITH PROPOFOL N/A 07/25/2021   Procedure: ESOPHAGOGASTRODUODENOSCOPY (EGD) WITH PROPOFOL;  Surgeon: Lanelle Bal, DO;  [facility];  Service: Endoscopy;  Laterality: N/A;  1:30pm   ESOPHAGOGASTRODUODENOSCOPY (EGD) WITH PROPOFOL N/A 12/04/2022   Procedure: ESOPHAGOGASTRODUODENOSCOPY (EGD) WITH PROPOFOL;  Surgeon: Lanelle Bal, DO;  [facility];  Service: Endoscopy;  Laterality: N/A;  8:00AM;ASA 3   NECK MASS EXCISION Right    "done in dr's office; behind right ear/side of ncek"   POLYPECTOMY  11/21/2016   Procedure: POLYPECTOMY;  Surgeon: West Bali, MD;  [facility];  Service: Endoscopy;;  descending colon polyp   SHOULDER ARTHROSCOPY W/ ROTATOR CUFF REPAIR Right 2006   acromioclavicular joint arthrosis    Social History   Socioeconomic History   Marital status: Married    Spouse name: Not on file   Number of children: Not on file   Years of education: Not on file   Highest education level: Not on file  Occupational History   Occupation: full time    Employer: UNEMPLOYED  Tobacco Use   Smoking status: Every Day    Current packs/day: 0.50    Average packs/day: 1 pack/day for 41.1 years (39.8 ttl pk-yrs)    Types: Cigarettes    Start date: 07/17/1982   Smokeless tobacco: Never   Tobacco comments:    1/2 pack a day  Vaping Use   Vaping status: Never Used  Substance and Sexual Activity   Alcohol use: Not Currently   Drug use: No   Sexual activity: Yes    Birth control/protection: None  Other Topics Concern   Not on file  Social History Narrative   Pt lives in [place] with wife.  5 children.  Unemployed due to panic attacks and back pain   Social Drivers of Corporate investment banker Strain: Not on file  Food Insecurity: Not on file  Transportation Needs: Not on file  Physical Activity: Not on file  Stress: Not on file  Social Connections: Not on file  Intimate Partner Violence: Not on file    Family History  Problem Relation Age of Onset   Lung cancer Mother    Alcohol abuse Mother    Heart attack Father 87   Diabetes Father    Alcohol abuse Father     Asthma Sister    Anxiety disorder Sister    Depression Sister    Anxiety disorder Sister    Hypertension Brother    Hypertension Brother    Heart attack Brother 21   Diabetes Brother    Hypertension Brother    Seizures Brother    Dementia Paternal Uncle    ADD / ADHD Daughter    Dementia Cousin    Colon cancer Neg Hx    Drug abuse Neg Hx    Bipolar disorder Neg Hx    OCD Neg Hx    Paranoid behavior Neg Hx    Schizophrenia Neg  Hx    Sexual abuse Neg Hx    Physical abuse Neg Hx     Anti-infectives: Anti-infectives (From admission, onward)    None       Current Outpatient Medications  Medication Sig Dispense Refill   acetaminophen (TYLENOL) 325 MG tablet Take 650 mg by mouth every 6 (six) hours as needed for moderate pain.     albuterol (VENTOLIN HFA) 108 (90 Base) MCG/ACT inhaler INHALE 2 PUFFS BY MOUTH EVERY 6 HOURS AS NEEDED FOR COUGHING, WHEEZING, OR SHORTNESS OF BREATH 20.1 g 0   ALPRAZolam (XANAX) 0.5 MG tablet Take one twice a day and two at bedtime 120 tablet 2   amLODipine (NORVASC) 10 MG tablet Take 1 tablet by mouth once daily 90 tablet 3   budesonide-formoterol (SYMBICORT) 80-4.5 MCG/ACT inhaler Take 2 puffs first thing in am and then another 2 puffs about 12 hours later. 1 each 12   dronedarone (MULTAQ) 400 MG tablet Take 1 tablet (400 mg total) by mouth 2 (two) times daily with a meal. 180 tablet 3   EPINEPHrine 0.3 mg/0.3 mL IJ SOAJ injection Inject 0.3 mg into the muscle as needed for anaphylaxis. 2 each 1   esomeprazole (NEXIUM) 40 MG capsule Take 1 capsule (40 mg total) by mouth 2 (two) times daily before a meal. 60 capsule 5   fluticasone (FLONASE) 50 MCG/ACT nasal spray Place 2 sprays into both nostrils daily. 16 g 5   isosorbide mononitrate (IMDUR) 30 MG 24 hr tablet Take 1 tablet (30 mg total) by mouth daily. 90 tablet 3   KLOR-CON M20 20 MEQ tablet TAKE 1 TABLET BY MOUTH EVERY DAY 90 tablet 2   metoprolol succinate (TOPROL-XL) 25 MG 24 hr tablet  TAKE 1/2 TABLET BY MOUTH EVERY DAY 45 tablet 3   nitroGLYCERIN (NITROSTAT) 0.4 MG SL tablet Place 1 tablet (0.4 mg total) under the tongue every 5 (five) minutes x 3 doses as needed for chest pain (if no relief after 3rd dose, proceed to the ED for an evaluation). 25 tablet 3   rivaroxaban (XARELTO) 20 MG TABS tablet TAKE 1 TABLET BY MOUTH ONCE DAILY WITH SUPPER 90 tablet 1   simvastatin (ZOCOR) 20 MG tablet Take 1 tablet by mouth once daily 90 tablet 3   triamcinolone cream (KENALOG) 0.1 % Apply 1 Application topically 2 (two) times daily. 15 g 2   vitamin B-12 (CYANOCOBALAMIN) 50 MCG tablet Take 50 mcg by mouth daily.     VITAMIN D PO Take 1 tablet by mouth daily.     No current facility-administered medications for this visit.     Objective: Vital signs in last 24 hours: BP 119/71   Pulse (!) 58   Intake/Output from previous day: No intake/output data recorded. Intake/Output this shift: @IOTHISSHIFT @   Physical Exam Vitals reviewed.  Constitutional:      Appearance: Normal appearance.  Cardiovascular:     Rate and Rhythm: Normal rate and regular rhythm.     Heart sounds: Normal heart sounds.  Pulmonary:     Effort: Pulmonary effort is normal. No respiratory distress.     Breath sounds: Normal breath sounds.  Abdominal:     General: Abdomen is flat.     Palpations: Abdomen is soft.     Hernia: No hernia is present.  Neurological:     Mental Status: He is alert.     Lab Results:  Results for orders placed or performed in visit on 08/09/23 (from the past 24 hours)  Urinalysis, Routine w reflex microscopic     Status: Abnormal   Collection Time: 08/09/23 11:41 AM  Result Value Ref Range   Specific Gravity, UA 1.030 1.005 - 1.030   pH, UA 6.0 5.0 - 7.5   Color, UA Yellow Yellow   Appearance Ur Clear Clear   Leukocytes,UA Negative Negative   Protein,UA Trace Negative/Trace   Glucose, UA Negative Negative   Ketones, UA Trace (A) Negative   RBC, UA Trace (A) Negative    Bilirubin, UA Negative Negative   Urobilinogen, Ur 0.2 0.2 - 1.0 mg/dL   Nitrite, UA Negative Negative   Microscopic Examination See below:    Narrative   Performed at:  [address] - [facility] [address] [place], [place]  [id number] Lab Director: Chinita Pester MT, Phone:  [phone]  Microscopic Examination     Status: Abnormal   Collection Time: 08/09/23 11:41 AM   Urine  Result Value Ref Range   WBC, UA None seen 0 - 5 /hpf   RBC, Urine 0-2 0 - 2 /hpf   Epithelial Cells (non renal) 0-10 0 - 10 /hpf   Mucus, UA Present (A) Not Estab.   Bacteria, UA None seen None seen/Few   Narrative   Performed at:  [address] - [facility] [address], [place], [place]  [id number] Lab Director: Chinita Pester MT, Phone:  [phone]   *Note: Due to a large number of results and/or encounters for the requested time period, some results have not been displayed. A complete set of results can be found in Results Review.    BMET No results for input(s): "NA", "K", "CL", "CO2", "GLUCOSE", "BUN", "CREATININE", "CALCIUM" in the last 72 hours.  PT/INR No results for input(s): "LABPROT", "INR" in the last 72 hours. ABG No results for input(s): "PHART", "HCO3" in the last 72 hours.  Invalid input(s): "PCO2", "PO2" Recent Results (from the past 2160 hours)  Basic metabolic panel     Status: Abnormal   Collection Time: 06/01/23 11:12 PM  Result Value Ref Range   Sodium 137 135 - 145 mmol/L   Potassium 3.3 (L) 3.5 - 5.1 mmol/L   Chloride 106 98 - 111 mmol/L   CO2 22 22 - 32 mmol/L   Glucose, Bld 165 (H) 70 - 99 mg/dL    Comment: Glucose reference range applies only to samples taken after fasting for at least 8 hours.   BUN 17 6 - 20 mg/dL   Creatinine, Ser 9.52 0.61 - 1.24 mg/dL   Calcium 8.5 (L) 8.9 - 10.3 mg/dL   GFR, Estimated >84 >13 mL/min    Comment: (NOTE) Calculated using the CKD-EPI Creatinine Equation (2021)    Anion gap 9 5 - 15    Comment: Performed at [facility], [address] [place], [place] [address]  CBC     Status: None   Collection Time: 06/01/23 11:12 PM  Result Value Ref Range   WBC 5.4 4.0 - 10.5 K/uL   RBC 4.51 4.22 - 5.81 MIL/uL   Hemoglobin 14.4 13.0 - 17.0 g/dL   HCT 02.7 25.3 - 66.4 %   MCV 91.1 80.0 - 100.0 fL   MCH 31.9 26.0 - 34.0 pg   MCHC 35.0 30.0 - 36.0 g/dL   RDW 40.3 47.4 - 25.9 %   Platelets 163 150 - 400 K/uL   nRBC 0.0 0.0 - 0.2 %    Comment: Performed at [facility], [address], [place], [place] [address]  Troponin  I (High Sensitivity)     Status: None   Collection Time: 06/01/23 11:12 PM  Result Value Ref Range   Troponin I (High Sensitivity) <2 <18 ng/L    Comment: (NOTE) Elevated high sensitivity troponin I (hsTnI) values and significant  changes across serial measurements may suggest ACS but many other  chronic and acute conditions are known to elevate hsTnI results.  Refer to the "Links" section for chest pain algorithms and additional  guidance. Performed at [facility], [address], [place], [place] [address]   Troponin I (High Sensitivity)     Status: None   Collection Time: 06/02/23  1:12 AM  Result Value Ref Range   Troponin I (High Sensitivity) 2 <18 ng/L    Comment: (NOTE) Elevated high sensitivity troponin I (hsTnI) values and significant  changes across serial measurements may suggest ACS but many other  chronic and acute conditions are known to elevate hsTnI results.  Refer to the "Links" section for chest pain algorithms and additional  guidance. Performed at [facility], [address] [place], [place] [address]   Basic metabolic panel     Status: None   Collection Time: 06/08/23  4:07 PM  Result Value Ref Range   Glucose 96 70 - 99 mg/dL   BUN 15 6 - 24 mg/dL   Creatinine, Ser 0.98 0.76 - 1.27 mg/dL   eGFR 119 >14 NW/GNF/6.21   BUN/Creatinine Ratio 17 9 - 20   Sodium 141 134 - 144 mmol/L   Potassium 3.9 3.5 - 5.2 mmol/L   Chloride 102 96 - 106 mmol/L   CO2 25 20 - 29  mmol/L   Calcium 9.1 8.7 - 10.2 mg/dL  Hemoglobin H0Q     Status: Abnormal   Collection Time: 06/19/23 11:21 AM  Result Value Ref Range   Hgb A1c MFr Bld 5.8 (H) 4.8 - 5.6 %    Comment:          Prediabetes: 5.7 - 6.4          Diabetes: >6.4          Glycemic control for adults with diabetes: <7.0    Est. average glucose Bld gHb Est-mCnc 120 mg/dL  VITAMIN D 25 Hydroxy (Vit-D Deficiency, Fractures)     Status: None   Collection Time: 06/19/23 11:21 AM  Result Value Ref Range   Vit D, 25-Hydroxy 52.0 30.0 - 100.0 ng/mL    Comment: Vitamin D deficiency has been defined by the Institute of Medicine and an Endocrine Society practice guideline as a level of serum 25-OH vitamin D less than 20 ng/mL (1,2). The Endocrine Society went on to further define vitamin D insufficiency as a level between 21 and 29 ng/mL (2). 1. IOM (Institute of Medicine). 2010. Dietary reference    intakes for calcium and D. [place]: The    [employer]. 2. Holick MF, Binkley Rogers, Bischoff-Ferrari HA, et al.    Evaluation, treatment, and prevention of vitamin D    deficiency: an Endocrine Society clinical practice    guideline. JCEM. 2011 Jul; 96(7):1911-30.   TSH + free T4     Status: None   Collection Time: 06/19/23 11:21 AM  Result Value Ref Range   TSH 1.250 0.450 - 4.500 uIU/mL   Free T4 1.25 0.82 - 1.77 ng/dL  Lipid panel     Status: Abnormal   Collection Time: 06/19/23 11:21 AM  Result Value Ref Range   Cholesterol, Total 115 100 - 199 mg/dL  Triglycerides 70 0 - 149 mg/dL   HDL 39 (L) >60 mg/dL   VLDL Cholesterol Cal 15 5 - 40 mg/dL   LDL Chol Calc (NIH) 61 0 - 99 mg/dL   Chol/HDL Ratio 2.9 0.0 - 5.0 ratio    Comment:                                   T. Chol/HDL Ratio                                             Men  Women                               1/2 Avg.Risk  3.4    3.3                                   Avg.Risk  5.0    4.4                                2X Avg.Risk   9.6    7.1                                3X Avg.Risk 23.4   11.0   CMP14+EGFR     Status: Abnormal   Collection Time: 06/19/23 11:21 AM  Result Value Ref Range   Glucose 102 (H) 70 - 99 mg/dL   BUN 11 6 - 24 mg/dL   Creatinine, Ser 1.09 0.76 - 1.27 mg/dL   eGFR 92 >32 TF/TDD/2.20   BUN/Creatinine Ratio 11 9 - 20   Sodium 141 134 - 144 mmol/L   Potassium 4.5 3.5 - 5.2 mmol/L   Chloride 103 96 - 106 mmol/L   CO2 26 20 - 29 mmol/L   Calcium 9.0 8.7 - 10.2 mg/dL   Total Protein 6.6 6.0 - 8.5 g/dL   Albumin 4.3 3.8 - 4.9 g/dL   Globulin, Total 2.3 1.5 - 4.5 g/dL   Bilirubin Total 0.5 0.0 - 1.2 mg/dL   Alkaline Phosphatase 57 44 - 121 IU/L   AST 14 0 - 40 IU/L   ALT 23 0 - 44 IU/L  CBC with Differential/Platelet     Status: None   Collection Time: 06/19/23 11:21 AM  Result Value Ref Range   WBC 6.0 3.4 - 10.8 x10E3/uL   RBC 4.89 4.14 - 5.80 x10E6/uL   Hemoglobin 15.4 13.0 - 17.7 g/dL   Hematocrit 25.4 27.0 - 51.0 %   MCV 93 79 - 97 fL   MCH 31.5 26.6 - 33.0 pg   MCHC 34.0 31.5 - 35.7 g/dL   RDW 62.3 76.2 - 83.1 %   Platelets 202 150 - 450 x10E3/uL   Neutrophils 63 Not Estab. %   Lymphs 27 Not Estab. %   Monocytes 7 Not Estab. %   Eos 3 Not Estab. %   Basos 0 Not Estab. %   Neutrophils Absolute 3.7 1.4 - 7.0 x10E3/uL   Lymphocytes Absolute 1.6 0.7 - 3.1 x10E3/uL   Monocytes Absolute 0.4  0.1 - 0.9 x10E3/uL   EOS (ABSOLUTE) 0.2 0.0 - 0.4 x10E3/uL   Basophils Absolute 0.0 0.0 - 0.2 x10E3/uL   Immature Granulocytes 0 Not Estab. %   Immature Grans (Abs) 0.0 0.0 - 0.1 x10E3/uL  Testosterone,Free and Total     Status: Abnormal   Collection Time: 06/19/23 11:22 AM  Result Value Ref Range   Testosterone 582 264 - 916 ng/dL    Comment: Adult male reference interval is based on a population of healthy nonobese males (BMI <30) between 37 and 63 years old. Travison, et.al. JCEM [phone]. PMID: [id number].    Testosterone, Free 5.1 (L) 7.2 - 24.0 pg/mL  Urine Culture      Status: None   Collection Time: 07/03/23 12:00 PM   Specimen: Urine   UR  Result Value Ref Range   Urine Culture, Routine Final report    Organism ID, Bacteria Comment     Comment: Culture shows less than 10,000 colony forming units of bacteria per milliliter of urine. This colony count is not generally considered to be clinically significant.   Urinalysis     Status: Abnormal   Collection Time: 07/03/23  1:18 PM  Result Value Ref Range   Specific Gravity, UA 1.025 1.005 - 1.030   pH, UA 5.5 5.0 - 7.5   Color, UA Amber (A) Yellow   Appearance Ur Clear Clear   Leukocytes,UA Negative Negative   Protein,UA 1+ (A) Negative/Trace   Glucose, UA Negative Negative   Ketones, UA 1+ (A) Negative   RBC, UA Negative Negative   Bilirubin, UA Negative Negative   Urobilinogen, Ur 0.2 0.2 - 1.0 mg/dL   Nitrite, UA Negative Negative  Basic metabolic panel     Status: Abnormal   Collection Time: 08/06/23 10:15 PM  Result Value Ref Range   Sodium 138 135 - 145 mmol/L   Potassium 3.4 (L) 3.5 - 5.1 mmol/L   Chloride 104 98 - 111 mmol/L   CO2 25 22 - 32 mmol/L   Glucose, Bld 156 (H) 70 - 99 mg/dL    Comment: Glucose reference range applies only to samples taken after fasting for at least 8 hours.   BUN 16 6 - 20 mg/dL   Creatinine, Ser 4.74 0.61 - 1.24 mg/dL   Calcium 8.9 8.9 - 25.9 mg/dL   GFR, Estimated >56 >38 mL/min    Comment: (NOTE) Calculated using the CKD-EPI Creatinine Equation (2021)    Anion gap 9 5 - 15    Comment: Performed at [facility], [address] [place], [place] [address]  CBC     Status: None   Collection Time: 08/06/23 10:15 PM  Result Value Ref Range   WBC 7.4 4.0 - 10.5 K/uL   RBC 4.70 4.22 - 5.81 MIL/uL   Hemoglobin 15.0 13.0 - 17.0 g/dL   HCT 32.9 51.8 - 84.1 %   MCV 91.1 80.0 - 100.0 fL   MCH 31.9 26.0 - 34.0 pg   MCHC 35.0 30.0 - 36.0 g/dL   RDW 66.0 63.0 - 16.0 %   Platelets 183 150 - 400 K/uL   nRBC 0.0 0.0 - 0.2 %    Comment: Performed at [facility], [address] [place], [place] [address]  Troponin I (High Sensitivity)     Status: None   Collection Time: 08/06/23 10:15 PM  Result Value Ref Range   Troponin I (High Sensitivity) 3 <18 ng/L    Comment: (NOTE) Elevated high sensitivity troponin I (hsTnI) values  and significant  changes across serial measurements may suggest ACS but many other  chronic and acute conditions are known to elevate hsTnI results.  Refer to the "Links" section for chest pain algorithms and additional  guidance. Performed at [facility], [address], [place], [place] [address]   Magnesium     Status: None   Collection Time: 08/06/23 10:15 PM  Result Value Ref Range   Magnesium 2.1 1.7 - 2.4 mg/dL    Comment: Performed at [facility], [address], [place], [place] [address]  TSH     Status: None   Collection Time: 08/06/23 10:15 PM  Result Value Ref Range   TSH 1.595 0.350 - 4.500 uIU/mL    Comment: Performed by a 3rd Generation assay with a functional sensitivity of <=0.01 uIU/mL. Performed at [facility], [address] [place], [place] [address]   Troponin I (High Sensitivity)     Status: None   Collection Time: 08/07/23 12:22 AM  Result Value Ref Range   Troponin I (High Sensitivity) 8 <18 ng/L    Comment: (NOTE) Elevated high sensitivity troponin I (hsTnI) values and significant  changes across serial measurements may suggest ACS but many other  chronic and acute conditions are known to elevate hsTnI results.  Refer to the "Links" section for chest pain algorithms and additional  guidance. Performed at [facility], [address], [place], [place] [address]   Urinalysis, Routine w reflex microscopic     Status: Abnormal   Collection Time: 08/09/23 11:41 AM  Result Value Ref Range   Specific Gravity, UA 1.030 1.005 - 1.030   pH, UA 6.0 5.0 - 7.5   Color, UA Yellow Yellow   Appearance Ur Clear Clear   Leukocytes,UA Negative Negative   Protein,UA Trace Negative/Trace    Glucose, UA Negative Negative   Ketones, UA Trace (A) Negative   RBC, UA Trace (A) Negative   Bilirubin, UA Negative Negative   Urobilinogen, Ur 0.2 0.2 - 1.0 mg/dL   Nitrite, UA Negative Negative   Microscopic Examination See below:     Comment: Microscopic was indicated and was performed.  Microscopic Examination     Status: Abnormal   Collection Time: 08/09/23 11:41 AM   Urine  Result Value Ref Range   WBC, UA None seen 0 - 5 /hpf   RBC, Urine 0-2 0 - 2 /hpf   Epithelial Cells (non renal) 0-10 0 - 10 /hpf   Mucus, UA Present (A) Not Estab.   Bacteria, UA None seen None seen/Few    Studies/Results: No results found. I reviewed his scrotal US from 2002 and he had right epididymal cysts. I reviewed his CT from 2022 and he has aortoiliac atherosclerosis.   Assessment/Plan: Low Testosterone and unexplained weight loss.   His total T was normal but the free T was low.   I will repeat morning fasting labs and have him return with the results.  Left Epididymal cyst. Scrotal US.  ED.  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  I discussed the VED, penile injection therapy and IPP, but I strongly recommended he quit smoking and increase his cardiovascular exercise.    No orders of the defined types were placed in this encounter.    Orders Placed This Encounter  Procedures   Microscopic Examination   US SCROTUM W/DOPPLER    Standing Status:   Future    Expected Date:  08/10/2023    Expiration Date:   02/06/2024    Reason for Exam (SYMPTOM  OR DIAGNOSIS REQUIRED):   left spermatocele    Preferred imaging location?:   [facility]   Urinalysis, Routine w reflex microscopic   Testosterone Free, Profile I    Standing Status:   Future    Expected Date:   08/16/2023    Expiration Date:   08/08/2024   Follicle stimulating hormone    Standing Status:   Future    Expected Date:   08/16/2023    Expiration Date:   12/07/2023    Estradiol    Standing Status:   Future    Expected Date:   08/16/2023    Expiration Date:   12/07/2023   Luteinizing hormone    Standing Status:   Future    Expected Date:   08/16/2023    Expiration Date:   12/07/2023     Return for Next available with lab results..    CC: Gilmore Laroche FNP.     Bjorn Pippin 08/10/2023

## 2023-08-13 ENCOUNTER — Encounter (HOSPITAL_COMMUNITY): Payer: Self-pay | Admitting: Psychiatry

## 2023-08-13 ENCOUNTER — Telehealth (HOSPITAL_COMMUNITY): Payer: Medicaid Other | Admitting: Psychiatry

## 2023-08-13 DIAGNOSIS — F4001 Agoraphobia with panic disorder: Secondary | ICD-10-CM

## 2023-08-13 DIAGNOSIS — F419 Anxiety disorder, unspecified: Secondary | ICD-10-CM

## 2023-08-13 DIAGNOSIS — F4310 Post-traumatic stress disorder, unspecified: Secondary | ICD-10-CM | POA: Diagnosis not present

## 2023-08-13 DIAGNOSIS — F331 Major depressive disorder, recurrent, moderate: Secondary | ICD-10-CM

## 2023-08-13 MED ORDER — ALPRAZOLAM 0.5 MG PO TABS: ORAL_TABLET | ORAL | 2 refills | Status: DC

## 2023-08-13 NOTE — Progress Notes (Signed)
Virtual Visit via Video Note  I connected with Richard [name] on 08/13/23 at 11:00 AM EST by a video enabled telemedicine application and verified that I am speaking with the correct person using two identifiers.  Location: Patient: home Provider: office   I discussed the limitations of evaluation and management by telemedicine and the availability of in person appointments. The patient expressed understanding and agreed to proceed.     I discussed the assessment and treatment plan with the patient. The patient was provided an opportunity to ask questions and all were answered. The patient agreed with the plan and demonstrated an understanding of the instructions.   The patient was advised to call back or seek an in-person evaluation if the symptoms worsen or if the condition fails to improve as anticipated.  I provided 20 minutes of non-face-to-face time during this encounter.   Diannia Ruder, MD  [facility] MD/PA/NP OP Progress Note  08/13/2023 11:12 AM RYLE BUSCEMI  MRN:  [id number]  Chief Complaint:  Chief Complaint  Patient presents with   Anxiety   Follow-up   HPI: This patient is a 58 year old white male lives with his wife in [place].  He is on an unemployment and also gets SSI disability.  The patient returns for follow-up after 3 months regarding his anxiety and panic attacks.  He has had some visits to the emergency room for chest pain and for atrial fibrillation.  He states when his heart rate gets too high takes a Xanax and usually it helps but not always.  He is closely followed by cardiology.  He states that his energy is low.  He was noted to have low testosterone and has seen a urologist.  He is going to have his labs repeated as well as a scrotal ultrasound due to his cyst.  He denies being significantly depressed but states he worries a lot about his kids and about the state of his house.  He thinks he has mold in several areas that I strongly suggested he get a mold  test kit.  He states that without the Xanax he would not be able to function due to severe anxiety. Visit Diagnosis:    ICD-10-CM   1. 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 PTSD (post-traumatic stress disorder)  F43.10     3. Recurrent moderate major depressive disorder with anxiety (HCC)  F33.1    F41.9       Past Psychiatric History: none  Past Medical History:  Past Medical History:  Diagnosis Date   Arthritis    Asthma    CAD (coronary artery disease)    a. Cardiac cath 07/2015 showed 65% distal Cx, 20% mid-distal LAD, 20% prox-distal RCA, EF 60%, EDP .   Colitis 1990   COPD (chronic obstructive pulmonary disease) (HCC)    Depression    Dysrhythmia    Essential hypertension    Fatty liver    Gastric ulcer 2003; 2012   2003: + esophagitis; negative H.pylori serology  2012: Dr. Darrick Penna, mild gastritis, Bravo PH probe placement, negative H.pylori   GERD (gastroesophageal reflux disease)    Hepatic steatosis    History of hiatal hernia    Hyperlipemia    Overweight    Panic attacks    Paroxysmal atrial fibrillation (HCC)    Pulmonary nodules    Stroke (HCC)    TIA (transient ischemic attack)    Type II diabetes mellitus (HCC)  Past Surgical History:  Procedure Laterality Date   BALLOON DILATION N/A 07/25/2021   Procedure: BALLOON DILATION;  Surgeon: Lanelle Bal, DO;  [facility];  Service: Endoscopy;  Laterality: N/A;   BIOPSY  07/25/2021   Procedure: BIOPSY;  Surgeon: Lanelle Bal, DO;  [facility];  Service: Endoscopy;;   BIOPSY  12/04/2022   Procedure: BIOPSY;  Surgeon: Lanelle Bal, DO;  [facility];  Service: Endoscopy;;   BRAVO PH STUDY  05/03/2011   XBJ:YNWG gastritis/normal esophagus and duodenum   CARDIAC CATHETERIZATION  1990s X 1; 2005; 08/12/2015   CARDIAC CATHETERIZATION N/A 08/12/2015   Procedure: Left Heart Cath and Coronary Angiography;  Surgeon: Lyn Records,  MD; LAD 20%, CFX 65%, RCA 20%, EF 60%    COLONOSCOPY  1990   COLONOSCOPY WITH PROPOFOL N/A 11/21/2016   Dr. Darrick Penna: non-thrombosed external hemorrhoids, one 6 mm polyp (polypoid lesion), internal hemorrhoids. TI Normal. 10 years screening   ESOPHAGOGASTRODUODENOSCOPY  05/03/2011   NFA:OZHY gastritis   ESOPHAGOGASTRODUODENOSCOPY (EGD) WITH PROPOFOL N/A 07/25/2021   Procedure: ESOPHAGOGASTRODUODENOSCOPY (EGD) WITH PROPOFOL;  Surgeon: Lanelle Bal, DO;  [facility];  Service: Endoscopy;  Laterality: N/A;  1:30pm   ESOPHAGOGASTRODUODENOSCOPY (EGD) WITH PROPOFOL N/A 12/04/2022   Procedure: ESOPHAGOGASTRODUODENOSCOPY (EGD) WITH PROPOFOL;  Surgeon: Lanelle Bal, DO;  [facility];  Service: Endoscopy;  Laterality: N/A;  8:00AM;ASA 3   NECK MASS EXCISION Right    "done in dr's office; behind right ear/side of ncek"   POLYPECTOMY  11/21/2016   Procedure: POLYPECTOMY;  Surgeon: West Bali, MD;  [facility];  Service: Endoscopy;;  descending colon polyp   SHOULDER ARTHROSCOPY W/ ROTATOR CUFF REPAIR Right 2006   acromioclavicular joint arthrosis    Family Psychiatric History: See below  Family History:  Family History  Problem Relation Age of Onset   Lung cancer Mother    Alcohol abuse Mother    Heart attack Father 67   Diabetes Father    Alcohol abuse Father    Asthma Sister    Anxiety disorder Sister    Depression Sister    Anxiety disorder Sister    Hypertension Brother    Hypertension Brother    Heart attack Brother 58   Diabetes Brother    Hypertension Brother    Seizures Brother    Dementia Paternal Uncle    ADD / ADHD Daughter    Dementia Cousin    Colon cancer Neg Hx    Drug abuse Neg Hx    Bipolar disorder Neg Hx    OCD Neg Hx    Paranoid behavior Neg Hx    Schizophrenia Neg Hx    Sexual abuse Neg Hx    Physical abuse Neg Hx     Social History:  Social History   Socioeconomic History   Marital status: Married    Spouse  name: Not on file   Number of children: Not on file   Years of education: Not on file   Highest education level: Not on file  Occupational History   Occupation: full time    Employer: UNEMPLOYED  Tobacco Use   Smoking status: Every Day    Current packs/day: 0.50    Average packs/day: 1 pack/day for 41.1 years (39.8 ttl pk-yrs)    Types: Cigarettes    Start date: 07/17/1982   Smokeless tobacco: Never   Tobacco comments:    1/2 pack a day  Vaping  Use   Vaping status: Never Used  Substance and Sexual Activity   Alcohol use: Not Currently   Drug use: No   Sexual activity: Yes    Birth control/protection: None  Other Topics Concern   Not on file  Social History Narrative   Pt lives in [place] with wife.  5 children.  Unemployed due to panic attacks and back pain   Social Drivers of Corporate investment banker Strain: Not on file  Food Insecurity: Not on file  Transportation Needs: Not on file  Physical Activity: Not on file  Stress: Not on file  Social Connections: Not on file    Allergies:  Allergies  Allergen Reactions   Alpha-Gal Anaphylaxis   Dexilant [Dexlansoprazole] Anaphylaxis   Mushroom Ext Cmplx(Shiitake-Reishi-Mait) Anaphylaxis    Rapid heart rate.   Penicillins Anaphylaxis    Has patient had a PCN reaction causing immediate rash, facial/tongue/throat swelling, SOB or lightheadedness with hypotension: Yes Has patient had a PCN reaction causing severe rash involving mucus membranes or skin necrosis: No Has patient had a PCN reaction that required hospitalization Yes Has patient had a PCN reaction occurring within the last 10 years: No If all of the above answers are "NO", then may proceed with Cephalosporin use.    Doxycycline Nausea And Vomiting         Metabolic Disorder Labs: Lab Results  Component Value Date   HGBA1C 5.8 (H) 06/19/2023   MPG 111.15 02/16/2022   MPG 114.02 11/09/2021   No results found for: "PROLACTIN" Lab Results   Component Value Date   CHOL 115 06/19/2023   TRIG 70 06/19/2023   HDL 39 (L) 06/19/2023   CHOLHDL 2.9 06/19/2023   VLDL 8 06/13/2022   LDLCALC 61 06/19/2023   LDLCALC 54 02/13/2023   Lab Results  Component Value Date   TSH 1.595 08/06/2023   TSH 1.250 06/19/2023    Therapeutic Level Labs: No results found for: "LITHIUM" No results found for: "VALPROATE" No results found for: "CBMZ"  Current Medications: Current Outpatient Medications  Medication Sig Dispense Refill   acetaminophen (TYLENOL) 325 MG tablet Take 650 mg by mouth every 6 (six) hours as needed for moderate pain.     albuterol (VENTOLIN HFA) 108 (90 Base) MCG/ACT inhaler INHALE 2 PUFFS BY MOUTH EVERY 6 HOURS AS NEEDED FOR COUGHING, WHEEZING, OR SHORTNESS OF BREATH 20.1 g 0   ALPRAZolam (XANAX) 0.5 MG tablet Take one twice a day and two at bedtime 120 tablet 2   amLODipine (NORVASC) 10 MG tablet Take 1 tablet by mouth once daily 90 tablet 3   budesonide-formoterol (SYMBICORT) 80-4.5 MCG/ACT inhaler Take 2 puffs first thing in am and then another 2 puffs about 12 hours later. 1 each 12   dronedarone (MULTAQ) 400 MG tablet Take 1 tablet (400 mg total) by mouth 2 (two) times daily with a meal. 180 tablet 3   EPINEPHrine 0.3 mg/0.3 mL IJ SOAJ injection Inject 0.3 mg into the muscle as needed for anaphylaxis. 2 each 1   esomeprazole (NEXIUM) 40 MG capsule Take 1 capsule (40 mg total) by mouth 2 (two) times daily before a meal. 60 capsule 5   fluticasone (FLONASE) 50 MCG/ACT nasal spray Place 2 sprays into both nostrils daily. 16 g 5   isosorbide mononitrate (IMDUR) 30 MG 24 hr tablet Take 1 tablet (30 mg total) by mouth daily. 90 tablet 3   KLOR-CON M20 20 MEQ tablet TAKE 1 TABLET BY MOUTH EVERY DAY 90  tablet 2   metoprolol succinate (TOPROL-XL) 25 MG 24 hr tablet TAKE 1/2 TABLET BY MOUTH EVERY DAY 45 tablet 3   nitroGLYCERIN (NITROSTAT) 0.4 MG SL tablet Place 1 tablet (0.4 mg total) under the tongue every 5 (five) minutes x  3 doses as needed for chest pain (if no relief after 3rd dose, proceed to the ED for an evaluation). 25 tablet 3   rivaroxaban (XARELTO) 20 MG TABS tablet TAKE 1 TABLET BY MOUTH ONCE DAILY WITH SUPPER 90 tablet 1   simvastatin (ZOCOR) 20 MG tablet Take 1 tablet by mouth once daily 90 tablet 3   triamcinolone cream (KENALOG) 0.1 % Apply 1 Application topically 2 (two) times daily. 15 g 2   vitamin B-12 (CYANOCOBALAMIN) 50 MCG tablet Take 50 mcg by mouth daily.     VITAMIN D PO Take 1 tablet by mouth daily.     No current facility-administered medications for this visit.     Musculoskeletal: Strength & Muscle Tone: within normal limits Gait & Station: normal Patient leans: N/A  Psychiatric Specialty Exam: Review of Systems  Constitutional:  Positive for fatigue.  Respiratory:  Positive for cough and shortness of breath.   Psychiatric/Behavioral:  The patient is nervous/anxious.   All other systems reviewed and are negative.   There were no vitals taken for this visit.There is no height or weight on file to calculate BMI.  General Appearance: Casual and Fairly Groomed  Eye Contact:  Good  Speech:  Clear and Coherent  Volume:  Normal  Mood:  Anxious and Euthymic  Affect:  Congruent  Thought Process:  Goal Directed  Orientation:  Full (Time, Place, and Person)  Thought Content: WDL   Suicidal Thoughts:  No  Homicidal Thoughts:  No  Memory:  Immediate;   Good Recent;   Good Remote;   NA  Judgement:  Good  Insight:  Fair  Psychomotor Activity:  Decreased  Concentration:  Concentration: Good and Attention Span: Good  Recall:  Good  Fund of Knowledge: Good  Language: Good  Akathisia:  No  Handed:  Right  AIMS (if indicated): not done  Assets:  Communication Skills Desire for Improvement Resilience Social Support  ADL's:  Intact  Cognition: WNL  Sleep:  Good   Screenings: GAD-7    Flowsheet Row Office Visit from 07/03/2023 in [facility]  Primary Care Office  Visit from 06/18/2023 in [facility] Primary Care Office Visit from 02/12/2023 in [facility] Primary Care Office Visit from 12/05/2022 in [facility] Primary Care Office Visit from 11/10/2022 in [facility] Primary Care  Total GAD-7 Score 8 0 [phone] 13       PHQ2-9    Flowsheet Row Office Visit from 07/03/2023 in [facility] Office Visit from 06/18/2023 in [facility] Primary Care Office Visit from 02/12/2023 in [facility] Primary Care Office Visit from 12/05/2022 in [facility] Primary Care Office Visit from 11/10/2022 in [facility] Primary Care  PHQ-2 Total Score 2 0 2 2 3   PHQ-9 Total Score 9 0 6 5 10       Flowsheet Row ED from 08/06/2023 in [facility] Emergency Department at [facility] Most recent reading at 08/06/2023  9:41 PM ED from 06/01/2023 in [facility] Emergency Department at [facility] Most recent reading at 06/01/2023 10:54 PM ED from 06/01/2023 in [facility] [place] Most recent reading at 06/01/2023  1:00 PM  C-SSRS  RISK CATEGORY No Risk No Risk No Risk        Assessment and Plan: This patient is a 58 year old male with a history of anxiety panic attacks and agoraphobia.  He still feels that the Xanax is helpful for his anxiety so he will continue Xanax 0.5 mg twice daily for anxiety and 1 mg at bedtime for anxiety and sleep.  He will return to see me in 3 months  Collaboration of Care: Collaboration of Care: Referral or follow-up with counselor/therapist AEB patient will continue therapy with Suzan Garibaldi in our office  Patient/Guardian was advised Release of Information must be obtained prior to any record release in order to collaborate their care with an outside provider. Patient/Guardian was advised if they have not already done so to contact the registration department to sign all necessary forms in order for [name] to release  information regarding their care.   Consent: Patient/Guardian gives verbal consent for treatment and assignment of benefits for services provided during this visit. Patient/Guardian expressed understanding and agreed to proceed.    Diannia Ruder, MD 08/13/2023, 11:12 AM

## 2023-08-14 ENCOUNTER — Encounter: Payer: Self-pay | Admitting: Internal Medicine

## 2023-08-14 ENCOUNTER — Ambulatory Visit (INDEPENDENT_AMBULATORY_CARE_PROVIDER_SITE_OTHER): Payer: Medicaid Other | Admitting: Internal Medicine

## 2023-08-14 VITALS — BP 116/72 | HR 54 | Ht 72.0 in | Wt 174.2 lb

## 2023-08-14 DIAGNOSIS — E876 Hypokalemia: Secondary | ICD-10-CM

## 2023-08-14 DIAGNOSIS — Z09 Encounter for follow-up examination after completed treatment for conditions other than malignant neoplasm: Secondary | ICD-10-CM | POA: Insufficient documentation

## 2023-08-14 DIAGNOSIS — I480 Paroxysmal atrial fibrillation: Secondary | ICD-10-CM

## 2023-08-14 DIAGNOSIS — R252 Cramp and spasm: Secondary | ICD-10-CM | POA: Insufficient documentation

## 2023-08-14 DIAGNOSIS — I25118 Atherosclerotic heart disease of native coronary artery with other forms of angina pectoris: Secondary | ICD-10-CM

## 2023-08-14 NOTE — Patient Instructions (Signed)
Please continue to take medications as prescribed.  Please continue to follow low salt diet and perform moderate exercise/walking as tolerated.

## 2023-08-14 NOTE — Assessment & Plan Note (Signed)
On potassium supplement Repeat BMP and magnesium

## 2023-08-14 NOTE — Assessment & Plan Note (Signed)
Could be due to statins and/or hypokalemia Advised to continue potassium supplement and take co-Q10 - 100 mg QD

## 2023-08-14 NOTE — Progress Notes (Signed)
Established Patient Office Visit  Subjective:  Patient ID: Richard [name], male    DOB: [birth year]  Age: 58 y.o. MRN: [id number]  CC:  Chief Complaint  Patient presents with   Follow-up    ED f/u d/c on 08/06/2023, pt reports feeling about the same, has been cutting out salt foods.    HPI Richard [name] is a 58 y.o. male with past medical history of CAD (nonobstructive disease by cath in 07/2015, low-risk NST in 12/2020), paroxysmal atrial fibrillation, carotid artery disease, HTN, HLD, asthma, anxiety and depression who presents for f/u of recent ER visit on 08/06/23.  He went to ER for an episode of palpitations, that lasted about an hour prior to ER visit.  He was in sinus rhythm upon arrival to ER.  His palpitations had improved by that time as well.  He has history of atrial fibrillation, and takes metoprolol and Xarelto for it. His HR usually remains in 50s.  He is undergoing evaluation of CAD.he was recently given Imdur for chest discomfort, but decided not to add medicine for now.  He has cardiology appointment tomorrow.   Today, he reports epigastric/lower chest discomfort at times, especially after eating.  He takes Nexium 40 mg twice daily for GERD.  Denies dysphagia or odynophagia currently.  Past Medical History:  Diagnosis Date   Arthritis    Asthma    CAD (coronary artery disease)    a. Cardiac cath 07/2015 showed 65% distal Cx, 20% mid-distal LAD, 20% prox-distal RCA, EF 60%, EDP .   Colitis 1990   COPD (chronic obstructive pulmonary disease) (HCC)    Depression    Dysrhythmia    Essential hypertension    Fatty liver    Gastric ulcer 2003; 2012   2003: + esophagitis; negative H.pylori serology  2012: Dr. Darrick Penna, mild gastritis, Bravo PH probe placement, negative H.pylori   GERD (gastroesophageal reflux disease)    Hepatic steatosis    History of hiatal hernia    Hyperlipemia    Overweight    Panic attacks    Paroxysmal atrial fibrillation (HCC)     Pulmonary nodules    Stroke (HCC)    TIA (transient ischemic attack)    Type II diabetes mellitus (HCC)     Past Surgical History:  Procedure Laterality Date   BALLOON DILATION N/A 07/25/2021   Procedure: BALLOON DILATION;  Surgeon: Lanelle Bal, DO;  [facility];  Service: Endoscopy;  Laterality: N/A;   BIOPSY  07/25/2021   Procedure: BIOPSY;  Surgeon: Lanelle Bal, DO;  [facility];  Service: Endoscopy;;   BIOPSY  12/04/2022   Procedure: BIOPSY;  Surgeon: Lanelle Bal, DO;  [facility];  Service: Endoscopy;;   BRAVO PH STUDY  05/03/2011   WUJ:WJXB gastritis/normal esophagus and duodenum   CARDIAC CATHETERIZATION  1990s X 1; 2005; 08/12/2015   CARDIAC CATHETERIZATION N/A 08/12/2015   Procedure: Left Heart Cath and Coronary Angiography;  Surgeon: Lyn Records, MD; LAD 20%, CFX 65%, RCA 20%, EF 60%    COLONOSCOPY  1990   COLONOSCOPY WITH PROPOFOL N/A 11/21/2016   Dr. Darrick Penna: non-thrombosed external hemorrhoids, one 6 mm polyp (polypoid lesion), internal hemorrhoids. TI Normal. 10 years screening   ESOPHAGOGASTRODUODENOSCOPY  05/03/2011   JYN:WGNF gastritis   ESOPHAGOGASTRODUODENOSCOPY (EGD) WITH PROPOFOL N/A 07/25/2021   Procedure: ESOPHAGOGASTRODUODENOSCOPY (EGD) WITH PROPOFOL;  Surgeon: Lanelle Bal, DO;  [facility];  Service: Endoscopy;  Laterality: N/A;  1:30pm  ESOPHAGOGASTRODUODENOSCOPY (EGD) WITH PROPOFOL N/A 12/04/2022   Procedure: ESOPHAGOGASTRODUODENOSCOPY (EGD) WITH PROPOFOL;  Surgeon: Lanelle Bal, DO;  [facility];  Service: Endoscopy;  Laterality: N/A;  8:00AM;ASA 3   NECK MASS EXCISION Right    "done in dr's office; behind right ear/side of ncek"   POLYPECTOMY  11/21/2016   Procedure: POLYPECTOMY;  Surgeon: West Bali, MD;  [facility]: [facility];  Service: Endoscopy;;  descending colon polyp   SHOULDER ARTHROSCOPY W/ ROTATOR CUFF REPAIR Right 2006   acromioclavicular joint arthrosis     Family History  Problem Relation Age of Onset   Lung cancer Mother    Alcohol abuse Mother    Heart attack Father 75   Diabetes Father    Alcohol abuse Father    Asthma Sister    Anxiety disorder Sister    Depression Sister    Anxiety disorder Sister    Hypertension Brother    Hypertension Brother    Heart attack Brother 53   Diabetes Brother    Hypertension Brother    Seizures Brother    Dementia Paternal Uncle    ADD / ADHD Daughter    Dementia Cousin    Colon cancer Neg Hx    Drug abuse Neg Hx    Bipolar disorder Neg Hx    OCD Neg Hx    Paranoid behavior Neg Hx    Schizophrenia Neg Hx    Sexual abuse Neg Hx    Physical abuse Neg Hx     Social History   Socioeconomic History   Marital status: Married    Spouse name: Not on file   Number of children: Not on file   Years of education: Not on file   Highest education level: Not on file  Occupational History   Occupation: full time    Employer: UNEMPLOYED  Tobacco Use   Smoking status: Every Day    Current packs/day: 0.50    Average packs/day: 1 pack/day for 41.1 years (39.8 ttl pk-yrs)    Types: Cigarettes    Start date: 07/17/1982   Smokeless tobacco: Never   Tobacco comments:    1/2 pack a day  Vaping Use   Vaping status: Never Used  Substance and Sexual Activity   Alcohol use: Not Currently   Drug use: No   Sexual activity: Yes    Birth control/protection: None  Other Topics Concern   Not on file  Social History Narrative   Pt lives in [place] with wife.  5 children.  Unemployed due to panic attacks and back pain   Social Drivers of Corporate investment banker Strain: Not on file  Food Insecurity: Not on file  Transportation Needs: Not on file  Physical Activity: Not on file  Stress: Not on file  Social Connections: Not on file  Intimate Partner Violence: Not on file    Outpatient Medications Prior to Visit  Medication Sig Dispense Refill   acetaminophen (TYLENOL) 325 MG tablet  Take 650 mg by mouth every 6 (six) hours as needed for moderate pain.     albuterol (VENTOLIN HFA) 108 (90 Base) MCG/ACT inhaler INHALE 2 PUFFS BY MOUTH EVERY 6 HOURS AS NEEDED FOR COUGHING, WHEEZING, OR SHORTNESS OF BREATH 20.1 g 0   ALPRAZolam (XANAX) 0.5 MG tablet Take one twice a day and two at bedtime 120 tablet 2   amLODipine (NORVASC) 10 MG tablet Take 1 tablet by mouth once daily 90 tablet 3   budesonide-formoterol (SYMBICORT)  80-4.5 MCG/ACT inhaler Take 2 puffs first thing in am and then another 2 puffs about 12 hours later. 1 each 12   dronedarone (MULTAQ) 400 MG tablet Take 1 tablet (400 mg total) by mouth 2 (two) times daily with a meal. 180 tablet 3   EPINEPHrine 0.3 mg/0.3 mL IJ SOAJ injection Inject 0.3 mg into the muscle as needed for anaphylaxis. 2 each 1   esomeprazole (NEXIUM) 40 MG capsule Take 1 capsule (40 mg total) by mouth 2 (two) times daily before a meal. 60 capsule 5   fluticasone (FLONASE) 50 MCG/ACT nasal spray Place 2 sprays into both nostrils daily. 16 g 5   isosorbide mononitrate (IMDUR) 30 MG 24 hr tablet Take 1 tablet (30 mg total) by mouth daily. 90 tablet 3   KLOR-CON M20 20 MEQ tablet TAKE 1 TABLET BY MOUTH EVERY DAY 90 tablet 2   metoprolol succinate (TOPROL-XL) 25 MG 24 hr tablet TAKE 1/2 TABLET BY MOUTH EVERY DAY 45 tablet 3   nitroGLYCERIN (NITROSTAT) 0.4 MG SL tablet Place 1 tablet (0.4 mg total) under the tongue every 5 (five) minutes x 3 doses as needed for chest pain (if no relief after 3rd dose, proceed to the ED for an evaluation). 25 tablet 3   rivaroxaban (XARELTO) 20 MG TABS tablet TAKE 1 TABLET BY MOUTH ONCE DAILY WITH SUPPER 90 tablet 1   simvastatin (ZOCOR) 20 MG tablet Take 1 tablet by mouth once daily 90 tablet 3   triamcinolone cream (KENALOG) 0.1 % Apply 1 Application topically 2 (two) times daily. 15 g 2   vitamin B-12 (CYANOCOBALAMIN) 50 MCG tablet Take 50 mcg by mouth daily.     VITAMIN D PO Take 1 tablet by mouth daily.     No  facility-administered medications prior to visit.    Allergies  Allergen Reactions   Alpha-Gal Anaphylaxis   Dexilant [Dexlansoprazole] Anaphylaxis   Mushroom Ext Cmplx(Shiitake-Reishi-Mait) Anaphylaxis    Rapid heart rate.   Penicillins Anaphylaxis    Has patient had a PCN reaction causing immediate rash, facial/tongue/throat swelling, SOB or lightheadedness with hypotension: Yes Has patient had a PCN reaction causing severe rash involving mucus membranes or skin necrosis: No Has patient had a PCN reaction that required hospitalization Yes Has patient had a PCN reaction occurring within the last 10 years: No If all of the above answers are "NO", then may proceed with Cephalosporin use.    Doxycycline Nausea And Vomiting         ROS Review of Systems  Constitutional:  Positive for fatigue. Negative for chills and fever.  HENT:  Negative for congestion and sore throat.   Eyes:  Negative for pain and discharge.  Respiratory:  Negative for cough and shortness of breath.   Cardiovascular:  Positive for palpitations. Negative for leg swelling.  Gastrointestinal:  Negative for diarrhea, nausea and vomiting.  Endocrine: Negative for polydipsia and polyuria.  Genitourinary:  Negative for dysuria and hematuria.  Musculoskeletal:  Negative for neck pain and neck stiffness.  Skin:  Negative for rash.  Neurological:  Negative for dizziness, weakness, numbness and headaches.  Psychiatric/Behavioral:  Negative for agitation and behavioral problems. The patient is nervous/anxious.       Objective:    Physical Exam Vitals reviewed.  Constitutional:      General: He is not in acute distress.    Appearance: He is not diaphoretic.  HENT:     Head: Normocephalic and atraumatic.     Nose: Nose normal.  Mouth/Throat:     Mouth: Mucous membranes are moist.     Pharynx: No posterior oropharyngeal erythema.  Eyes:     General: No scleral icterus.    Extraocular Movements: Extraocular  movements intact.  Cardiovascular:     Rate and Rhythm: Normal rate and regular rhythm.     Heart sounds: Normal heart sounds. No murmur heard. Pulmonary:     Breath sounds: Normal breath sounds. No wheezing or rales.  Musculoskeletal:     Cervical back: Neck supple. No tenderness.     Right lower leg: No edema.     Left lower leg: No edema.  Skin:    General: Skin is warm.     Findings: No rash.  Neurological:     General: No focal deficit present.     Mental Status: He is alert and oriented to person, place, and time.  Psychiatric:        Mood and Affect: Mood normal.        Behavior: Behavior normal.     BP 116/72   Pulse (!) 54   Ht 6' (1.829 m)   Wt 174 lb 3.2 oz (79 kg)   SpO2 97%   BMI 23.63 kg/m  Wt Readings from Last 3 Encounters:  08/14/23 174 lb 3.2 oz (79 kg)  08/01/23 174 lb 12.8 oz (79.3 kg)  07/19/23 172 lb 6.4 oz (78.2 kg)    Lab Results  Component Value Date   TSH 1.595 08/06/2023   Lab Results  Component Value Date   WBC 7.4 08/06/2023   HGB 15.0 08/06/2023   HCT 42.8 08/06/2023   MCV 91.1 08/06/2023   PLT 183 08/06/2023   Lab Results  Component Value Date   NA 138 08/06/2023   K 3.4 (L) 08/06/2023   CO2 25 08/06/2023   GLUCOSE 156 (H) 08/06/2023   BUN 16 08/06/2023   CREATININE 0.86 08/06/2023   BILITOT 0.5 06/19/2023   ALKPHOS 57 06/19/2023   AST 14 06/19/2023   ALT 23 06/19/2023   PROT 6.6 06/19/2023   ALBUMIN 4.3 06/19/2023   CALCIUM 8.9 08/06/2023   ANIONGAP 9 08/06/2023   EGFR 92 06/19/2023   Lab Results  Component Value Date   CHOL 115 06/19/2023   Lab Results  Component Value Date   HDL 39 (L) 06/19/2023   Lab Results  Component Value Date   LDLCALC 61 06/19/2023   Lab Results  Component Value Date   TRIG 70 06/19/2023   Lab Results  Component Value Date   CHOLHDL 2.9 06/19/2023   Lab Results  Component Value Date   HGBA1C 5.8 (H) 06/19/2023      Assessment & Plan:   Problem List Items Addressed  This Visit       Cardiovascular and Mediastinum   Coronary artery disease   On beta-blocker On statin Not on aspirin as he takes Xarelto for PAF May need cardiac cath based on cardiology evaluation      Paroxysmal atrial fibrillation (HCC)   Rate controlled with metoprolol On Xarelto Recent episode of palpitations could be due to A-fib with RVR, now resolved      Relevant Orders   Basic Metabolic Panel (BMET)     Other   Hypokalemia   On potassium supplement Repeat BMP and magnesium      Relevant Orders   Basic Metabolic Panel (BMET)   Magnesium   Leg cramps   Could be due to statins and/or hypokalemia Advised to continue potassium  supplement and take co-Q10 - 100 mg QD      Encounter for examination following treatment at hospital - Primary   ER chart reviewed, including blood test Palpitations and chest discomfort could be due to PAF, CAD - panic episode can also mimic similarly, has Xanax PRN for anxiety       No orders of the defined types were placed in this encounter.   Follow-up: Return if symptoms worsen or fail to improve.    Anabel Halon, MD

## 2023-08-14 NOTE — Assessment & Plan Note (Signed)
On beta-blocker On statin Not on aspirin as he takes Xarelto for PAF May need cardiac cath based on cardiology evaluation

## 2023-08-14 NOTE — Assessment & Plan Note (Addendum)
ER chart reviewed, including blood test Palpitations and chest discomfort could be due to PAF, CAD - panic episode can also mimic similarly, has Xanax PRN for anxiety

## 2023-08-14 NOTE — Assessment & Plan Note (Signed)
Rate controlled with metoprolol On Xarelto Recent episode of palpitations could be due to A-fib with RVR, now resolved

## 2023-08-15 ENCOUNTER — Encounter: Payer: Self-pay | Admitting: Cardiology

## 2023-08-15 ENCOUNTER — Ambulatory Visit: Payer: Medicaid Other | Attending: Cardiology | Admitting: Cardiology

## 2023-08-15 VITALS — BP 118/68 | HR 65 | Ht 72.0 in | Wt 173.4 lb

## 2023-08-15 DIAGNOSIS — E782 Mixed hyperlipidemia: Secondary | ICD-10-CM

## 2023-08-15 DIAGNOSIS — I2510 Atherosclerotic heart disease of native coronary artery without angina pectoris: Secondary | ICD-10-CM | POA: Diagnosis not present

## 2023-08-15 DIAGNOSIS — I480 Paroxysmal atrial fibrillation: Secondary | ICD-10-CM

## 2023-08-15 DIAGNOSIS — I10 Essential (primary) hypertension: Secondary | ICD-10-CM | POA: Diagnosis not present

## 2023-08-15 NOTE — Patient Instructions (Addendum)
Medication Instructions:   Your physician recommends that you continue on your current medications as directed. Please refer to the Current Medication list given to you today.  Labwork:  none  Testing/Procedures:  none  Follow-Up:  Your physician recommends that you schedule a follow-up appointment in: 4 months.   Any Other Special Instructions Will Be Listed Below (If Applicable).  If you need a refill on your cardiac medications before your next appointment, please call your pharmacy.

## 2023-08-15 NOTE — Progress Notes (Signed)
Cardiology Office Note  Date: 08/15/2023   ID: Richard [name], DOB 06-25-1966, MRN [id number]  History of Present Illness: Richard [name] is a 58 y.o. male seen recently in the office by Ms. Strader PA-C on January 15, I reviewed the note (our last visit was in 2022).  Records indicate recent ER visit at [facility] on January 20 in the setting of palpitations and chest discomfort secondary to recurrent atrial fibrillation.  He spontaneously converted to sinus rhythm after about 6 hours of symptoms and had no evidence of ACS by cardiac enzymes.  He is here today with his wife for follow-up and in sinus rhythm today by ECG which I reviewed.  He states that he has been taking his medications regularly.  Does mention that he had been eating a very high salt diet recently, urinating more.  He was mildly hypokalemic when he came in but takes a supplement at home.  He is not on a diuretic otherwise.  No persistent hypokalemia over time to suggest secondary causes.  Blood pressure is also normal.  I reviewed his medications.  Current regimen includes Norvasc, Multaq, Toprol-XL, Xarelto, and Zocor.  Physical Exam: VS:  BP 118/68   Pulse 65   Ht 6' (1.829 m)   Wt 173 lb 6.4 oz (78.7 kg)   SpO2 98%   BMI 23.52 kg/m , BMI Body mass index is 23.52 kg/m.  Wt Readings from Last 3 Encounters:  08/15/23 173 lb 6.4 oz (78.7 kg)  08/14/23 174 lb 3.2 oz (79 kg)  08/01/23 174 lb 12.8 oz (79.3 kg)    General: Patient appears comfortable at rest. HEENT: Conjunctiva and lids normal. Neck: Supple, no elevated JVP or carotid bruits. Lungs: Clear to auscultation, nonlabored breathing at rest. Cardiac: Regular rate and rhythm, no S3 or significant systolic murmur. Extremities: No pitting edema.  ECG:  An ECG dated 08/06/2023 was personally reviewed today and demonstrated:  Sinus rhythm.  Labwork: 06/19/2023: ALT 23; AST 14 08/06/2023: BUN 16; Creatinine, Ser 0.86; Hemoglobin 15.0; Magnesium 2.1;  Platelets 183; Potassium 3.4; Sodium 138; TSH 1.595     Component Value Date/Time   CHOL 115 06/19/2023 1121   TRIG 70 06/19/2023 1121   HDL 39 (L) 06/19/2023 1121   CHOLHDL 2.9 06/19/2023 1121   CHOLHDL 2.9 06/13/2022 0945   VLDL 8 06/13/2022 0945   LDLCALC 61 06/19/2023 1121   Other Studies Reviewed Today:  No interval cardiac testing for review today.  Assessment and Plan:  1.  CAD with moderate atherosclerosis documented at cardiac catheterization in 2017.  Follow-up coronary CTA in October 2024 revealed calcium score of 361 with FFR imaging only significant in the distal RCA and distal LAD with plan for medical therapy.  LVEF 60 to 65% by echocardiogram in January 2023.  Experienced angina with breakthrough atrial fibrillation but no evidence of ACS at ER visit.  Currently not taking Imdur and does not report any exertional symptoms.  Not on aspirin given use of Xarelto.  Would continue Zocor.  2.  Paroxysmal atrial fibrillation with CHA2DS2-VASc score of 2.  He follows with Dr. Ladona Ridgel and is on Multaq (previously on flecainide was not effective) along with Toprol-XL and Xarelto.  Recent 6-hour episode as discussed above.  We discussed options, for now he will continue with present medications and keep follow-up with Dr. Ladona Ridgel in March.  If symptoms increase in frequency and duration, may need to consider Tikosyn or possibly atrial fibrillation attempt.  3.  Primary hypertension.  Blood pressure is normal today on current regimen.  4.  Mixed hyperlipidemia.  Continue Zocor.  Recent LDL 61.  Disposition:  Follow up  4 months.  Signed, Jonelle Sidle, M.D., F.A.C.C. [facility]

## 2023-08-16 ENCOUNTER — Ambulatory Visit (HOSPITAL_COMMUNITY)
Admission: RE | Admit: 2023-08-16 | Discharge: 2023-08-16 | Disposition: A | Discharge status: Home / Self Care | Payer: Medicaid Other | Source: Ambulatory Visit | Attending: Urology | Admitting: Urology

## 2023-08-16 ENCOUNTER — Other Ambulatory Visit: Payer: Medicaid Other

## 2023-08-16 DIAGNOSIS — R634 Abnormal weight loss: Secondary | ICD-10-CM | POA: Diagnosis not present

## 2023-08-16 DIAGNOSIS — E876 Hypokalemia: Secondary | ICD-10-CM | POA: Diagnosis not present

## 2023-08-16 DIAGNOSIS — N503 Cyst of epididymis: Secondary | ICD-10-CM | POA: Diagnosis not present

## 2023-08-16 DIAGNOSIS — E291 Testicular hypofunction: Secondary | ICD-10-CM

## 2023-08-16 DIAGNOSIS — N4341 Spermatocele of epididymis, single: Secondary | ICD-10-CM | POA: Insufficient documentation

## 2023-08-16 DIAGNOSIS — I480 Paroxysmal atrial fibrillation: Secondary | ICD-10-CM | POA: Diagnosis not present

## 2023-08-17 LAB — TESTOSTERONE FREE, PROFILE I
Albumin: 4.5 g/dL (ref 3.8–4.9)
Sex Hormone Binding: 74.6 nmol/L (ref 19.3–76.4)
Testost., Free, Calc: 68.3 pg/mL (ref 35.8–168.2)
Testosterone: 579 ng/dL (ref 264–916)

## 2023-08-17 LAB — BASIC METABOLIC PANEL
BUN/Creatinine Ratio: 15 (ref 9–20)
BUN: 14 mg/dL (ref 6–24)
CO2: 23 mmol/L (ref 20–29)
Calcium: 9.2 mg/dL (ref 8.7–10.2)
Chloride: 104 mmol/L (ref 96–106)
Creatinine, Ser: 0.95 mg/dL (ref 0.76–1.27)
Glucose: 98 mg/dL (ref 70–99)
Potassium: 4 mmol/L (ref 3.5–5.2)
Sodium: 144 mmol/L (ref 134–144)
eGFR: 93 mL/min/{1.73_m2} (ref >59)

## 2023-08-17 LAB — LUTEINIZING HORMONE: LH: 3.8 m[IU]/mL (ref 1.7–8.6)

## 2023-08-17 LAB — MAGNESIUM: Magnesium: 2.1 mg/dL (ref 1.6–2.3)

## 2023-08-17 LAB — FOLLICLE STIMULATING HORMONE: FSH: 5.7 m[IU]/mL (ref 1.5–12.4)

## 2023-08-17 LAB — ESTRADIOL: Estradiol: 30.8 pg/mL (ref 7.6–42.6)

## 2023-08-18 DIAGNOSIS — Z419 Encounter for procedure for purposes other than remedying health state, unspecified: Secondary | ICD-10-CM | POA: Diagnosis not present

## 2023-08-28 ENCOUNTER — Telehealth: Payer: Self-pay | Admitting: Cardiology

## 2023-08-28 NOTE — Telephone Encounter (Signed)
Patient c/o Palpitations:  STAT if patient reporting lightheadedness, shortness of breath, or chest pain  How long have you had palpitations/irregular HR/ Afib? Are you having the symptoms now?  Fluttering, chest feels heavy  Are you currently experiencing lightheadedness, SOB or CP?   SOB  Do you have a history of afib (atrial fibrillation) or irregular heart rhythm?   Yes  Have you checked your BP or HR? (document readings if available):   HR 60  Oxygen 94  BP not taken today  Are you experiencing any other symptoms?  Coughing, aching in legs  Wife Neysa Bonito) stated patient has been having symptoms that he having low potassium.  Wife noted patient's legs have been aching and a "heavy feeling in his chest".

## 2023-08-28 NOTE — Telephone Encounter (Signed)
Spoke with wife and pt woke up with fluttering and heaviness on his chest.at 0500. Pt is c/o leg aching and locking up in his feet. Wife states that is happens about 2-3 days prior to his potassium dropping. Denies SOB. Pt tries to limit salt intake but wife states its hard because he does not have any top teeth. Current BP is 121/82 HR is 102. Pt states that he does not like checking his BP d/t it making him feel nervous. Pt states that Hr was 74 by pulse Ox 2 hours ago. Please advise.

## 2023-08-29 NOTE — Telephone Encounter (Signed)
Patient stated that he feels much better today. Pt declined lab work at this time. Pt stated that he does not have the fluttering/heaviness feeling that he had yesterday. Pt stated that he has not checked pulse this morning, but if his heart rate remained elevated when he checks it, he will take a extra 12.5 mg of metoprolol.  Pt had no further questions or concerns at this time.

## 2023-09-03 ENCOUNTER — Ambulatory Visit (INDEPENDENT_AMBULATORY_CARE_PROVIDER_SITE_OTHER): Payer: Medicaid Other | Admitting: Clinical

## 2023-09-03 DIAGNOSIS — F331 Major depressive disorder, recurrent, moderate: Secondary | ICD-10-CM | POA: Diagnosis not present

## 2023-09-03 DIAGNOSIS — F4001 Agoraphobia with panic disorder: Secondary | ICD-10-CM | POA: Diagnosis not present

## 2023-09-03 DIAGNOSIS — F419 Anxiety disorder, unspecified: Secondary | ICD-10-CM

## 2023-09-03 DIAGNOSIS — F4310 Post-traumatic stress disorder, unspecified: Secondary | ICD-10-CM

## 2023-09-03 NOTE — Progress Notes (Signed)
Virtual Visit via Video Note   I connected with Richard [name] on 09/03/23 at  1:00 PM EDT by a video enabled telemedicine application and verified that I am speaking with the correct person using two identifiers.   Location: Patient: home Provider: office   I discussed the limitations of evaluation and management by telemedicine and the availability of in person appointments. The patient expressed understanding and agreed to proceed.     THERAPIST PROGRESS NOTE     Session Time: 1:00 PM-1:45 PM   Participation Level: Active   Behavioral Response: Casual and Alert,Anxious   Type of Therapy: Individual Therapy   Treatment Goals addressed: Mood and Anxiety   Interventions: CBT   Summary: Richard [name] is a 58 y.o. male who presents with panic disorder/depression with anxiety/and PTSD . The OPT therapist worked with the patient for his OPT treatment. The OPT therapist utilized Motivational Interviewing to assist in creating therapeutic repore. The patient in the session was engaged and work in collaboration giving feedback about his triggers and symptoms over the past few weeks.The patient spoke about weather in the local area (snow) predicted for later this week. The OPT therapist utilized Cognitive Behavioral Therapy through cognitive restructuring as well as worked with the patient on coping strategies to assist in management of symptoms as well as reviewed sleep, eating habits, and general health. The patient continues to work on acceptance of limited mobility based on health conditions.The OPT therapist overviewed upcoming health appointments as listed in the patients MyChart.   Suicidal/Homicidal: Nowithout intent/plan   Therapist Response:The OPT therapist worked with the patient for the patients scheduled session. The patient was engaged in his session and gave feedback in relation to triggers, symptoms, and behavior responses over the past few weeks. The OPT therapist worked with  the patient utilizing an in session Cognitive Behavioral Therapy exercise. The patient was responsive in the session and verbalized, " I had to call my 2 sons to help I had a tree come through the roof and we patched it and have a tarp over it ".The OPT therapist worked with the patient providing ongoing psychotherapy/education and coping skills review including adaptations for the Winter season.The patient spoke about looking forward to the Spring season. The patient spoke about upcoming medical appointments through January.The OPT therapist provided psycho-education throughout the session. The OPT therapist will continue treatment work with the patient in his next scheduled session   Plan: Return again in 2/3 weeks.   Diagnosis:      Axis I: PTSD/Panic Disorder/ Depression with anxiety  Axis II: No diagnosis      Collaboration of Care: No additional collaboration of care for this session.   Patient/Guardian was advised Release of Information must be obtained prior to any record release in order to collaborate their care with an outside provider. Patient/Guardian was advised if they have not already done so to contact the registration department to sign all necessary forms in order for [name] to release information regarding their care.    Consent: Patient/Guardian gives verbal consent for treatment and assignment of benefits for services provided during this visit. Patient/Guardian expressed understanding and agreed to proceed.    I discussed the assessment and treatment plan with the patient. The patient was provided an opportunity to ask questions and all were answered. The patient agreed with the plan and demonstrated an understanding of the instructions.   The patient was advised to call back or seek an in-person evaluation if the  symptoms worsen or if the condition fails to improve as anticipated.   I provided 45 minutes of non-face-to-face time during this encounter.   [name], LCSW     09/03/2023

## 2023-09-05 ENCOUNTER — Encounter: Payer: Self-pay | Admitting: Gastroenterology

## 2023-09-05 ENCOUNTER — Ambulatory Visit: Payer: Medicaid Other | Admitting: Gastroenterology

## 2023-09-05 ENCOUNTER — Ambulatory Visit (INDEPENDENT_AMBULATORY_CARE_PROVIDER_SITE_OTHER): Payer: Medicaid Other | Admitting: Gastroenterology

## 2023-09-05 VITALS — BP 115/71 | HR 65 | Temp 97.8°F | Ht 72.0 in | Wt 174.1 lb

## 2023-09-05 DIAGNOSIS — K760 Fatty (change of) liver, not elsewhere classified: Secondary | ICD-10-CM | POA: Diagnosis not present

## 2023-09-05 DIAGNOSIS — R109 Unspecified abdominal pain: Secondary | ICD-10-CM

## 2023-09-05 DIAGNOSIS — K5900 Constipation, unspecified: Secondary | ICD-10-CM

## 2023-09-05 DIAGNOSIS — R140 Abdominal distension (gaseous): Secondary | ICD-10-CM | POA: Diagnosis not present

## 2023-09-05 DIAGNOSIS — K219 Gastro-esophageal reflux disease without esophagitis: Secondary | ICD-10-CM

## 2023-09-05 NOTE — Patient Instructions (Signed)
Let's try the lowest dose of Linzess 72 micrograms. Linzess works best when taken once a day every day, on an empty stomach, at least 30 minutes before your first meal of the day.  When Linzess is taken daily as directed:  *Constipation relief is typically felt in about a week *IBS-C patients may begin to experience relief from belly pain and overall abdominal symptoms (pain, discomfort, and bloating) in about 1 week,   with symptoms typically improving over 12 weeks.  Diarrhea may occur in the first 2 weeks -keep taking it.  The diarrhea should go away and you should start having normal, complete, full bowel movements. It may be helpful to start treatment when you can be near the comfort of your own bathroom, such as a weekend.   Let me know how it works for you, so I can send in a prescription!  I have ordered blood work for your liver.  We will see you in 6 months!  I have attached the Mediterranean diet, which is great for a fatty liver.   I enjoyed seeing you again today! I value our relationship and want to provide genuine, compassionate, and quality care. You may receive a survey regarding your visit with me, and I welcome your feedback! Thanks so much for taking the time to complete this. I look forward to seeing you again.      Gelene Mink, PhD, ANP-BC [facility]

## 2023-09-05 NOTE — Progress Notes (Signed)
 Gastroenterology Office Note     Primary Care Physician:  Gilmore Laroche, FNP  Primary Gastroenterologist: Dr. Marletta Lor    Chief Complaint   Chief Complaint  Patient presents with   Abdominal Pain    Patient here today due to issues with upper and lower abdominal pain. Pain ongoing off and on for the last few months, and worsening.  Patient says the pain is "prickly" to " sharp". He is taking Nexium 40 mg bid and generic of Mylanta as needed.  Feels bloated and "backed up", but having a bm daily, semi formed and not hard.     History of Present Illness   Richard [name] is a 58 y.o. male presenting today with a history of chronic GERD, abdominal pain, dysphagia, constipation, weight loss, esophageal dysmotility but unable to complete manometry due to transportation.    Due to persistent symptoms of feeling knot in mid esophagus, we updated BPE. This was normal. 13 mm barium tablet passed easily but the sensation of pressure in chest was noted when tablet passed. He was hesistant to start Baclofen .  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 Rechecking thru PCP next month.   Chewing up food well. Feels like not chewing well enough as missing teeth. Startin to bulge some in lower abdomen after eating. Upper abdomen pain and then radiates down to lower abdomen as day goes on. When has a BM, doesn't feel the urge always. Has pressure at rectum area but not feeling it in his abdomen. Feels backed up. BM every day in mornings. May miss a day rarely. Occasionally in evening. Feels like not emptying well. If tries to push, won't be productive. Has Miralax at home  EGD May 2024: abnormal esophageal motility, consistent with presbyesophagus, suspicious changes for Barrett's but negative path, gastritis, normal duodenum. Negative H.pylori.    EGD 07/2021: small hiatal hernia, abnormal esophageal motility, iron pill gastritis, no h.pylori, mucosal variant in duodenum (bx with  focal lymphangiectasia, neg for celiac)    Last colonoscopy 2018, 1 benign polyp removed, recommended 10-year recall.   Past Medical History:  Diagnosis Date   Arthritis    Asthma    CAD (coronary artery disease)    a. Cardiac cath 07/2015 showed 65% distal Cx, 20% mid-distal LAD, 20% prox-distal RCA, EF 60%, EDP .   Colitis 1990   COPD (chronic obstructive pulmonary disease) (HCC)    Depression    Dysrhythmia    Essential hypertension    Fatty liver    Gastric ulcer 2003; 2012   2003: + esophagitis; negative H.pylori serology  2012: Dr. Darrick Penna, mild gastritis, Bravo PH probe placement, negative H.pylori   GERD (gastroesophageal reflux disease)    Hepatic steatosis    History of hiatal hernia    Hyperlipemia    Overweight    Panic attacks    Paroxysmal atrial fibrillation (HCC)    Pulmonary nodules    Stroke (HCC)    TIA (transient ischemic attack)    Type II diabetes mellitus (HCC)     Past Surgical History:  Procedure Laterality Date   BALLOON DILATION N/A 07/25/2021   Procedure: BALLOON DILATION;  Surgeon: Lanelle Bal, DO;  [facility];  Service: Endoscopy;  Laterality: N/A;   BIOPSY  07/25/2021   Procedure: BIOPSY;  Surgeon: Lanelle Bal, DO;  [facility];  Service: Endoscopy;;   BIOPSY  12/04/2022   Procedure: BIOPSY;  Surgeon: Lanelle Bal, DO;  [facility];  Service: Endoscopy;;   BRAVO PH STUDY  05/03/2011   WJX:BJYN gastritis/normal esophagus and duodenum   CARDIAC CATHETERIZATION  1990s X 1; 2005; 08/12/2015   CARDIAC CATHETERIZATION N/A 08/12/2015   Procedure: Left Heart Cath and Coronary Angiography;  Surgeon: Lyn Records, MD; LAD 20%, CFX 65%, RCA 20%, EF 60%    COLONOSCOPY  1990   COLONOSCOPY WITH PROPOFOL N/A 11/21/2016   Dr. Darrick Penna: non-thrombosed external hemorrhoids, one 6 mm polyp (polypoid lesion), internal hemorrhoids. TI Normal. 10 years screening   ESOPHAGOGASTRODUODENOSCOPY  05/03/2011   WGN:FAOZ  gastritis   ESOPHAGOGASTRODUODENOSCOPY (EGD) WITH PROPOFOL N/A 07/25/2021   Procedure: ESOPHAGOGASTRODUODENOSCOPY (EGD) WITH PROPOFOL;  Surgeon: Lanelle Bal, DO;  [facility];  Service: Endoscopy;  Laterality: N/A;  1:30pm   ESOPHAGOGASTRODUODENOSCOPY (EGD) WITH PROPOFOL N/A 12/04/2022   Procedure: ESOPHAGOGASTRODUODENOSCOPY (EGD) WITH PROPOFOL;  Surgeon: Lanelle Bal, DO;  [facility];  Service: Endoscopy;  Laterality: N/A;  8:00AM;ASA 3   NECK MASS EXCISION Right    "done in dr's office; behind right ear/side of ncek"   POLYPECTOMY  11/21/2016   Procedure: POLYPECTOMY;  Surgeon: West Bali, MD;  [facility];  Service: Endoscopy;;  descending colon polyp   SHOULDER ARTHROSCOPY W/ ROTATOR CUFF REPAIR Right 2006   acromioclavicular joint arthrosis    Current Outpatient Medications  Medication Sig Dispense Refill   acetaminophen (TYLENOL) 325 MG tablet Take 650 mg by mouth every 6 (six) hours as needed for moderate pain.     albuterol (VENTOLIN HFA) 108 (90 Base) MCG/ACT inhaler INHALE 2 PUFFS BY MOUTH EVERY 6 HOURS AS NEEDED FOR COUGHING, WHEEZING, OR SHORTNESS OF BREATH 20.1 g 0   ALPRAZolam (XANAX) 0.5 MG tablet Take one twice a day and two at bedtime 120 tablet 2   amLODipine (NORVASC) 10 MG tablet Take 1 tablet by mouth once daily 90 tablet 3   budesonide-formoterol (SYMBICORT) 80-4.5 MCG/ACT inhaler Take 2 puffs first thing in am and then another 2 puffs about 12 hours later. 1 each 12   dronedarone (MULTAQ) 400 MG tablet Take 1 tablet (400 mg total) by mouth 2 (two) times daily with a meal. 180 tablet 3   EPINEPHrine 0.3 mg/0.3 mL IJ SOAJ injection Inject 0.3 mg into the muscle as needed for anaphylaxis. 2 each 1   esomeprazole (NEXIUM) 40 MG capsule Take 1 capsule (40 mg total) by mouth 2 (two) times daily before a meal. 60 capsule 5   fluticasone (FLONASE) 50 MCG/ACT nasal spray Place 2 sprays into both nostrils daily. 16 g 5   KLOR-CON  M20 20 MEQ tablet TAKE 1 TABLET BY MOUTH EVERY DAY 90 tablet 2   metoprolol succinate (TOPROL-XL) 25 MG 24 hr tablet TAKE 1/2 TABLET BY MOUTH EVERY DAY 45 tablet 3   nitroGLYCERIN (NITROSTAT) 0.4 MG SL tablet Place 1 tablet (0.4 mg total) under the tongue every 5 (five) minutes x 3 doses as needed for chest pain (if no relief after 3rd dose, proceed to the ED for an evaluation). 25 tablet 3   rivaroxaban (XARELTO) 20 MG TABS tablet TAKE 1 TABLET BY MOUTH ONCE DAILY WITH SUPPER 90 tablet 1   simvastatin (ZOCOR) 20 MG tablet Take 1 tablet by mouth once daily 90 tablet 3   triamcinolone cream (KENALOG) 0.1 % Apply 1 Application topically 2 (two) times daily. 15 g 2   vitamin B-12 (CYANOCOBALAMIN) 50 MCG tablet Take 50 mcg by mouth daily.  VITAMIN D PO Take 1 tablet by mouth daily.     isosorbide mononitrate (IMDUR) 30 MG 24 hr tablet Take 1 tablet (30 mg total) by mouth daily. (Patient not taking: Reported on 09/05/2023) 90 tablet 3   No current facility-administered medications for this visit.    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   Dexilant [dexlansoprazole] Anaphylaxis 01/20/2015   Mushroom ext cmplx(shiitake-reishi-mait) Anaphylaxis 03/22/2011   Penicillins Anaphylaxis    Doxycycline Nausea And Vomiting 04/18/2016    Family History  Problem Relation Age of Onset   Lung cancer Mother    Alcohol abuse Mother    Heart attack Father 90   Diabetes Father    Alcohol abuse Father    Asthma Sister    Anxiety disorder Sister    Depression Sister    Anxiety disorder Sister    Hypertension Brother    Hypertension Brother    Heart attack Brother 85   Diabetes Brother    Hypertension Brother    Seizures Brother    Dementia Paternal Uncle    ADD / ADHD Daughter    Dementia Cousin    Colon cancer Neg Hx    Drug abuse Neg Hx    Bipolar disorder Neg Hx    OCD Neg Hx    Paranoid behavior Neg Hx     Schizophrenia Neg Hx    Sexual abuse Neg Hx    Physical abuse Neg Hx     Social History   Socioeconomic History   Marital status: Married    Spouse name: Not on file   Number of children: Not on file   Years of education: Not on file   Highest education level: Not on file  Occupational History   Occupation: full time    Employer: UNEMPLOYED  Tobacco Use   Smoking status: Every Day    Current packs/day: 0.50    Average packs/day: 1 pack/day for 41.1 years (39.8 ttl pk-yrs)    Types: Cigarettes    Start date: 07/17/1982   Smokeless tobacco: Never   Tobacco comments:    1/2 pack a day  Vaping Use   Vaping status: Never Used  Substance and Sexual Activity   Alcohol use: Not Currently   Drug use: No   Sexual activity: Yes    Birth control/protection: None  Other Topics Concern   Not on file  Social History Narrative   Pt lives in [place] with wife.  5 children.  Unemployed due to panic attacks and back pain   Social Drivers of Corporate investment banker Strain: Not on file  Food Insecurity: Not on file  Transportation Needs: Not on file  Physical Activity: Not on file  Stress: Not on file  Social Connections: Not on file  Intimate Partner Violence: Not on file     Review of Systems   Gen: Denies any fever, chills, fatigue, weight loss, lack of appetite.  CV: Denies chest pain, heart palpitations, peripheral edema, syncope.  Resp: Denies shortness of breath at rest or with exertion. Denies wheezing or cough.  GI: Denies dysphagia or odynophagia. Denies jaundice, hematemesis, fecal incontinence. GU : Denies urinary burning, urinary frequency, urinary hesitancy MS: Denies joint pain, muscle weakness, cramps, or limitation of movement.  Derm: Denies rash, itching, dry skin Psych: Denies depression, anxiety, memory loss, and confusion Heme: Denies bruising, bleeding, and enlarged lymph nodes.   Physical Exam   BP 115/71 (  BP Location: Left Arm, Patient  Position: Sitting, Cuff Size: Normal)   Pulse 65   Temp 97.8 F (36.6 C) (Temporal)   Ht 6' (1.829 m)   Wt 174 lb 1.6 oz (79 kg)   BMI 23.61 kg/m  General:   Alert and oriented. Pleasant and cooperative. Well-nourished and well-developed.  Head:  Normocephalic and atraumatic. Eyes:  Without icterus Abdomen:  +BS, soft, non-tender and non-distended. No HSM noted. No guarding or rebound. No masses appreciated.  Rectal:  Deferred  Msk:  Symmetrical without gross deformities. Normal posture. Extremities:  Without edema. Neurologic:  Alert and  oriented x4;  grossly normal neurologically. Skin:  Intact without significant lesions or rashes. Psych:  Alert and cooperative. Normal mood and affect.   Assessment   Richard [name] is a 58 y.o. male presenting today with a history of chronic GERD, abdominal pain, dysphagia, constipation, weight loss, esophageal dysmotility but unable to complete manometry due to transportation, hepatic steatosis in past. Now dealing with abdominal discomfort and bloating.   Abdominal discomfort/bloating: correlated with less productive stools, underlying constipation. Will start Linzess 72 mcg daily.   Hepatic steatosis: mild noted on imaging in the past. LFTs normal. Can check ELF score. Doubt significant fibrosis.       PLAN    Linzess 72 mcg samples Continue PPI Check ELF 6 months   Gelene Mink, PhD, [facility]

## 2023-09-13 ENCOUNTER — Ambulatory Visit (HOSPITAL_COMMUNITY)
Admission: RE | Admit: 2023-09-13 | Discharge: 2023-09-13 | Disposition: A | Discharge status: Home / Self Care | Payer: Medicaid Other | Source: Ambulatory Visit | Attending: Internal Medicine | Admitting: Internal Medicine

## 2023-09-13 DIAGNOSIS — R058 Other specified cough: Secondary | ICD-10-CM | POA: Insufficient documentation

## 2023-09-13 LAB — PULMONARY FUNCTION TEST
DL/VA % pred: 101 %
DL/VA: 4.27 ml/min/mmHg/L
DLCO unc % pred: 100 %
DLCO unc: 29.57 ml/min/mmHg
FEF 25-75 Post: 3.49 L/s
FEF 25-75 Pre: 2.96 L/s
FEF2575-%Change-Post: 17 %
FEF2575-%Pred-Post: 106 %
FEF2575-%Pred-Pre: 90 %
FEV1-%Change-Post: 3 %
FEV1-%Pred-Post: 98 %
FEV1-%Pred-Pre: 95 %
FEV1-Post: 3.89 L
FEV1-Pre: 3.77 L
FEV1FVC-%Change-Post: 0 %
FEV1FVC-%Pred-Pre: 98 %
FEV6-%Change-Post: 0 %
FEV6-%Pred-Post: 100 %
FEV6-%Pred-Pre: 100 %
FEV6-Post: 4.97 L
FEV6-Pre: 4.96 L
FEV6FVC-%Change-Post: -2 %
FEV6FVC-%Pred-Post: 100 %
FEV6FVC-%Pred-Pre: 103 %
FVC-%Change-Post: 2 %
FVC-%Pred-Post: 99 %
FVC-%Pred-Pre: 97 %
FVC-Post: 5.15 L
FVC-Pre: 5.02 L
Post FEV1/FVC ratio: 75 %
Post FEV6/FVC ratio: 97 %
Pre FEV1/FVC ratio: 75 %
Pre FEV6/FVC Ratio: 99 %
RV % pred: 118 %
RV: 2.73 L
TLC % pred: 106 %
TLC: 7.86 L

## 2023-09-13 MED ORDER — ALBUTEROL SULFATE (2.5 MG/3ML) 0.083% IN NEBU
2.5000 mg | INHALATION_SOLUTION | Freq: Once | RESPIRATORY_TRACT | Status: AC
  Administered 2023-09-13: 2.5 mg via RESPIRATORY_TRACT

## 2023-09-15 DIAGNOSIS — Z419 Encounter for procedure for purposes other than remedying health state, unspecified: Secondary | ICD-10-CM | POA: Diagnosis not present

## 2023-09-18 ENCOUNTER — Ambulatory Visit: Payer: Medicaid Other | Admitting: Family Medicine

## 2023-09-20 ENCOUNTER — Ambulatory Visit (INDEPENDENT_AMBULATORY_CARE_PROVIDER_SITE_OTHER): Payer: Medicaid Other | Admitting: Family Medicine

## 2023-09-20 ENCOUNTER — Encounter: Payer: Self-pay | Admitting: Family Medicine

## 2023-09-20 VITALS — BP 118/76 | HR 54 | Resp 16 | Ht 72.0 in | Wt 173.0 lb

## 2023-09-20 DIAGNOSIS — I10 Essential (primary) hypertension: Secondary | ICD-10-CM | POA: Diagnosis not present

## 2023-09-20 DIAGNOSIS — K760 Fatty (change of) liver, not elsewhere classified: Secondary | ICD-10-CM | POA: Diagnosis not present

## 2023-09-20 DIAGNOSIS — E7849 Other hyperlipidemia: Secondary | ICD-10-CM | POA: Diagnosis not present

## 2023-09-20 DIAGNOSIS — E876 Hypokalemia: Secondary | ICD-10-CM

## 2023-09-20 DIAGNOSIS — R7301 Impaired fasting glucose: Secondary | ICD-10-CM

## 2023-09-20 DIAGNOSIS — E038 Other specified hypothyroidism: Secondary | ICD-10-CM | POA: Diagnosis not present

## 2023-09-20 DIAGNOSIS — E782 Mixed hyperlipidemia: Secondary | ICD-10-CM

## 2023-09-20 DIAGNOSIS — E559 Vitamin D deficiency, unspecified: Secondary | ICD-10-CM | POA: Diagnosis not present

## 2023-09-20 NOTE — Progress Notes (Signed)
 Established Patient Office Visit  Subjective:  Patient ID: Richard [name], male    DOB: 07-11-1966  Age: 58 y.o. MRN: [id number]  CC:  Chief Complaint  Patient presents with   Follow-up    3 month follow up. Wants potassium checked states its been dropping     HPI Richard [name] is a 58 y.o. male with past medical history of hypokalemia, hypertension, hyperlipidemia presents for f/u of  chronic medical conditions. For the details of today's visit, please refer to the assessment and plan.     Past Medical History:  Diagnosis Date   Arthritis    Asthma    CAD (coronary artery disease)    a. Cardiac cath 07/2015 showed 65% distal Cx, 20% mid-distal LAD, 20% prox-distal RCA, EF 60%, EDP .   Colitis 1990   COPD (chronic obstructive pulmonary disease) (HCC)    Depression    Dysrhythmia    Essential hypertension    Fatty liver    Gastric ulcer 2003; 2012   2003: + esophagitis; negative H.pylori serology  2012: Dr. Darrick Penna, mild gastritis, Bravo PH probe placement, negative H.pylori   GERD (gastroesophageal reflux disease)    Hepatic steatosis    History of hiatal hernia    Hyperlipemia    Overweight    Panic attacks    Paroxysmal atrial fibrillation (HCC)    Pulmonary nodules    Stroke (HCC)    TIA (transient ischemic attack)    Type II diabetes mellitus (HCC)     Past Surgical History:  Procedure Laterality Date   BALLOON DILATION N/A 07/25/2021   Procedure: BALLOON DILATION;  Surgeon: Lanelle Bal, DO;  [facility];  Service: Endoscopy;  Laterality: N/A;   BIOPSY  07/25/2021   Procedure: BIOPSY;  Surgeon: Lanelle Bal, DO;  [facility];  Service: Endoscopy;;   BIOPSY  12/04/2022   Procedure: BIOPSY;  Surgeon: Lanelle Bal, DO;  [facility];  Service: Endoscopy;;   BRAVO PH STUDY  05/03/2011   AOZ:HYQM gastritis/normal esophagus and duodenum   CARDIAC CATHETERIZATION  1990s X 1; 2005; 08/12/2015   CARDIAC  CATHETERIZATION N/A 08/12/2015   Procedure: Left Heart Cath and Coronary Angiography;  Surgeon: Lyn Records, MD; LAD 20%, CFX 65%, RCA 20%, EF 60%    COLONOSCOPY  1990   COLONOSCOPY WITH PROPOFOL N/A 11/21/2016   Dr. Darrick Penna: non-thrombosed external hemorrhoids, one 6 mm polyp (polypoid lesion), internal hemorrhoids. TI Normal. 10 years screening   ESOPHAGOGASTRODUODENOSCOPY  05/03/2011   VHQ:IONG gastritis   ESOPHAGOGASTRODUODENOSCOPY (EGD) WITH PROPOFOL N/A 07/25/2021   Procedure: ESOPHAGOGASTRODUODENOSCOPY (EGD) WITH PROPOFOL;  Surgeon: Lanelle Bal, DO;  [facility];  Service: Endoscopy;  Laterality: N/A;  1:30pm   ESOPHAGOGASTRODUODENOSCOPY (EGD) WITH PROPOFOL N/A 12/04/2022   Procedure: ESOPHAGOGASTRODUODENOSCOPY (EGD) WITH PROPOFOL;  Surgeon: Lanelle Bal, DO;  [facility];  Service: Endoscopy;  Laterality: N/A;  8:00AM;ASA 3   NECK MASS EXCISION Right    "done in dr's office; behind right ear/side of ncek"   POLYPECTOMY  11/21/2016   Procedure: POLYPECTOMY;  Surgeon: West Bali, MD;  [facility];  Service: Endoscopy;;  descending colon polyp   SHOULDER ARTHROSCOPY W/ ROTATOR CUFF REPAIR Right 2006   acromioclavicular joint arthrosis    Family History  Problem Relation Age of Onset   Lung cancer Mother    Alcohol abuse Mother    Heart attack Father 63   Diabetes  Father    Alcohol abuse Father    Asthma Sister    Anxiety disorder Sister    Depression Sister    Anxiety disorder Sister    Hypertension Brother    Hypertension Brother    Heart attack Brother 25   Diabetes Brother    Hypertension Brother    Seizures Brother    Dementia Paternal Uncle    ADD / ADHD Daughter    Dementia Cousin    Colon cancer Neg Hx    Drug abuse Neg Hx    Bipolar disorder Neg Hx    OCD Neg Hx    Paranoid behavior Neg Hx    Schizophrenia Neg Hx    Sexual abuse Neg Hx    Physical abuse Neg Hx     Social History   Socioeconomic History    Marital status: Married    Spouse name: Not on file   Number of children: Not on file   Years of education: Not on file   Highest education level: Not on file  Occupational History   Occupation: full time    Employer: UNEMPLOYED  Tobacco Use   Smoking status: Every Day    Current packs/day: 0.50    Average packs/day: 1 pack/day for 41.2 years (39.8 ttl pk-yrs)    Types: Cigarettes    Start date: 07/17/1982   Smokeless tobacco: Never   Tobacco comments:    1/2 pack a day  Vaping Use   Vaping status: Never Used  Substance and Sexual Activity   Alcohol use: Not Currently   Drug use: No   Sexual activity: Yes    Birth control/protection: None  Other Topics Concern   Not on file  Social History Narrative   Pt lives in [place] with wife.  5 children.  Unemployed due to panic attacks and back pain   Social Drivers of Corporate investment banker Strain: Not on file  Food Insecurity: Not on file  Transportation Needs: Not on file  Physical Activity: Not on file  Stress: Not on file  Social Connections: Not on file  Intimate Partner Violence: Not on file    Outpatient Medications Prior to Visit  Medication Sig Dispense Refill   acetaminophen (TYLENOL) 325 MG tablet Take 650 mg by mouth every 6 (six) hours as needed for moderate pain.     albuterol (VENTOLIN HFA) 108 (90 Base) MCG/ACT inhaler INHALE 2 PUFFS BY MOUTH EVERY 6 HOURS AS NEEDED FOR COUGHING, WHEEZING, OR SHORTNESS OF BREATH 20.1 g 0   ALPRAZolam (XANAX) 0.5 MG tablet Take one twice a day and two at bedtime 120 tablet 2   amLODipine (NORVASC) 10 MG tablet Take 1 tablet by mouth once daily 90 tablet 3   budesonide-formoterol (SYMBICORT) 80-4.5 MCG/ACT inhaler Take 2 puffs first thing in am and then another 2 puffs about 12 hours later. 1 each 12   dronedarone (MULTAQ) 400 MG tablet Take 1 tablet (400 mg total) by mouth 2 (two) times daily with a meal. 180 tablet 3   EPINEPHrine 0.3 mg/0.3 mL IJ SOAJ injection Inject  0.3 mg into the muscle as needed for anaphylaxis. 2 each 1   esomeprazole (NEXIUM) 40 MG capsule Take 1 capsule (40 mg total) by mouth 2 (two) times daily before a meal. 60 capsule 5   fluticasone (FLONASE) 50 MCG/ACT nasal spray Place 2 sprays into both nostrils daily. 16 g 5   isosorbide mononitrate (IMDUR) 30 MG 24 hr tablet Take 1 tablet (  30 mg total) by mouth daily. 90 tablet 3   KLOR-CON M20 20 MEQ tablet TAKE 1 TABLET BY MOUTH EVERY DAY 90 tablet 2   metoprolol succinate (TOPROL-XL) 25 MG 24 hr tablet TAKE 1/2 TABLET BY MOUTH EVERY DAY 45 tablet 3   nitroGLYCERIN (NITROSTAT) 0.4 MG SL tablet Place 1 tablet (0.4 mg total) under the tongue every 5 (five) minutes x 3 doses as needed for chest pain (if no relief after 3rd dose, proceed to the ED for an evaluation). 25 tablet 3   rivaroxaban (XARELTO) 20 MG TABS tablet TAKE 1 TABLET BY MOUTH ONCE DAILY WITH SUPPER 90 tablet 1   simvastatin (ZOCOR) 20 MG tablet Take 1 tablet by mouth once daily 90 tablet 3   triamcinolone cream (KENALOG) 0.1 % Apply 1 Application topically 2 (two) times daily. 15 g 2   vitamin B-12 (CYANOCOBALAMIN) 50 MCG tablet Take 50 mcg by mouth daily.     VITAMIN D PO Take 1 tablet by mouth daily.     No facility-administered medications prior to visit.    Allergies  Allergen Reactions   Alpha-Gal Anaphylaxis   Dexilant [Dexlansoprazole] Anaphylaxis   Mushroom Ext Cmplx(Shiitake-Reishi-Mait) Anaphylaxis    Rapid heart rate.   Penicillins Anaphylaxis    Has patient had a PCN reaction causing immediate rash, facial/tongue/throat swelling, SOB or lightheadedness with hypotension: Yes Has patient had a PCN reaction causing severe rash involving mucus membranes or skin necrosis: No Has patient had a PCN reaction that required hospitalization Yes Has patient had a PCN reaction occurring within the last 10 years: No If all of the above answers are "NO", then may proceed with Cephalosporin use.    Doxycycline Nausea And  Vomiting         ROS Review of Systems  Constitutional:  Negative for fatigue and fever.  Eyes:  Negative for visual disturbance.  Respiratory:  Negative for chest tightness and shortness of breath.   Cardiovascular:  Negative for chest pain and palpitations.  Neurological:  Negative for dizziness and headaches.      Objective:    Physical Exam HENT:     Head: Normocephalic.     Right Ear: External ear normal.     Left Ear: External ear normal.     Nose: No congestion or rhinorrhea.     Mouth/Throat:     Mouth: Mucous membranes are moist.  Cardiovascular:     Rate and Rhythm: Regular rhythm.     Heart sounds: No murmur heard. Pulmonary:     Effort: No respiratory distress.     Breath sounds: Normal breath sounds.  Neurological:     Mental Status: He is alert.     BP 118/76   Pulse (!) 54   Resp 16   Ht 6' (1.829 m)   Wt 173 lb (78.5 kg)   SpO2 95%   BMI 23.46 kg/m  Wt Readings from Last 3 Encounters:  09/20/23 173 lb (78.5 kg)  09/05/23 174 lb 1.6 oz (79 kg)  08/15/23 173 lb 6.4 oz (78.7 kg)    Lab Results  Component Value Date   TSH 1.330 09/20/2023   Lab Results  Component Value Date   WBC 6.4 09/20/2023   HGB 16.0 09/20/2023   HCT 46.2 09/20/2023   MCV 94 09/20/2023   PLT 185 09/20/2023   Lab Results  Component Value Date   NA 143 09/20/2023   K 4.3 09/20/2023   CO2 24 09/20/2023   GLUCOSE 110 (  H) 09/20/2023   BUN 15 09/20/2023   CREATININE 0.94 09/20/2023   BILITOT 0.4 09/20/2023   ALKPHOS 60 09/20/2023   AST 12 09/20/2023   ALT 16 09/20/2023   PROT 6.8 09/20/2023   ALBUMIN 4.6 09/20/2023   CALCIUM 9.1 09/20/2023   ANIONGAP 9 08/06/2023   EGFR 94 09/20/2023   Lab Results  Component Value Date   CHOL 117 09/20/2023   Lab Results  Component Value Date   HDL 43 09/20/2023   Lab Results  Component Value Date   LDLCALC 60 09/20/2023   Lab Results  Component Value Date   TRIG 65 09/20/2023   Lab Results  Component Value  Date   CHOLHDL 2.7 09/20/2023   Lab Results  Component Value Date   HGBA1C 5.8 (H) 09/20/2023      Assessment & Plan:  Essential hypertension Assessment & Plan: Controlled He takes amlodipine 10 mg daily and metoprolol 12.5 mg daily Asymptomatic today in the clinic A low-sodium diet of less than 2,300 mg daily is recommended, along with moderate-intensity physical activity for at least 150 minutes per week. The patient is encouraged to maintain these lifestyle modifications to help manage her blood pressure effectively.  Long-term considerations were discussed, emphasizing that uncontrolled hypertension increases the risk of cardiovascular diseases, including stroke, coronary artery disease, and heart failure.  The patient is encouraged to seek emergency care if blood pressure exceeds 180/120 and is accompanied by symptoms such as headaches, chest pain, palpitations, blurred vision, or dizziness. She verbalized understanding and will follow up as scheduled.  BP Readings from Last 3 Encounters:  09/20/23 118/76  09/05/23 115/71  08/15/23 118/68      Mixed hyperlipidemia Assessment & Plan: Stable on simvastatin 20 mg daily Encouraged decreasing his intake of greasy, fatty, starchy foods with increase physical activity  Lab Results  Component Value Date   CHOL 117 09/20/2023   HDL 43 09/20/2023   LDLCALC 60 09/20/2023   TRIG 65 09/20/2023   CHOLHDL 2.7 09/20/2023       Hypokalemia Assessment & Plan: Asymptomatic in the clinic Pending labs   IFG (impaired fasting glucose) -     Hemoglobin A1c  Vitamin D deficiency -     VITAMIN D 25 Hydroxy (Vit-D Deficiency, Fractures)  TSH (thyroid-stimulating hormone deficiency) -     TSH + free T4  Other hyperlipidemia -     Lipid panel -     CMP14+EGFR -     CBC with Differential/Platelet   Note: This chart has been completed using Engineer, civil (consulting) software, and while attempts have been made to ensure accuracy,  certain words and phrases may not be transcribed as intended.   Follow-up: Return in about 4 months (around 01/20/2024).   Gilmore Laroche, FNP

## 2023-09-20 NOTE — Patient Instructions (Signed)
 I appreciate the opportunity to provide care to you today!    Follow up:  4 months  Labs: please stop by the lab today to get your blood drawn (CBC, CMP, TSH, Lipid profile, HgA1c, Vit D)   Attached with your AVS, you will find valuable resources for self-education. I highly recommend dedicating some time to thoroughly examine them.   Please continue to a heart-healthy diet and increase your physical activities. Try to exercise for at least five days a week.    It was a pleasure to see you and I look forward to continuing to work together on your health and well-being. Please do not hesitate to call the office if you need care or have questions about your care.  In case of emergency, please visit the Emergency Department for urgent care, or contact our clinic at [phone] to schedule an appointment. We're here to help you!   Have a wonderful day and week. With Gratitude, Gilmore Laroche MSN, FNP-BC

## 2023-09-21 ENCOUNTER — Encounter: Payer: Self-pay | Admitting: Family Medicine

## 2023-09-21 LAB — LIPID PANEL
Chol/HDL Ratio: 2.7 ratio (ref 0.0–5.0)
Cholesterol, Total: 117 mg/dL (ref 100–199)
HDL: 43 mg/dL (ref >39)
LDL Chol Calc (NIH): 60 mg/dL (ref 0–99)
Triglycerides: 65 mg/dL (ref 0–149)
VLDL Cholesterol Cal: 14 mg/dL (ref 5–40)

## 2023-09-21 LAB — TSH+FREE T4
Free T4: 1.39 ng/dL (ref 0.82–1.77)
TSH: 1.33 u[IU]/mL (ref 0.450–4.500)

## 2023-09-21 LAB — CBC WITH DIFFERENTIAL/PLATELET
Basophils Absolute: 0 10*3/uL (ref 0.0–0.2)
Basos: 1 %
EOS (ABSOLUTE): 0.2 10*3/uL (ref 0.0–0.4)
Eos: 3 %
Hematocrit: 46.2 % (ref 37.5–51.0)
Hemoglobin: 16 g/dL (ref 13.0–17.7)
Immature Grans (Abs): 0 10*3/uL (ref 0.0–0.1)
Immature Granulocytes: 0 %
Lymphocytes Absolute: 1.7 10*3/uL (ref 0.7–3.1)
Lymphs: 26 %
MCH: 32.4 pg (ref 26.6–33.0)
MCHC: 34.6 g/dL (ref 31.5–35.7)
MCV: 94 fL (ref 79–97)
Monocytes Absolute: 0.5 10*3/uL (ref 0.1–0.9)
Monocytes: 8 %
Neutrophils Absolute: 4 10*3/uL (ref 1.4–7.0)
Neutrophils: 62 %
Platelets: 185 10*3/uL (ref 150–450)
RBC: 4.94 x10E6/uL (ref 4.14–5.80)
RDW: 12.4 % (ref 11.6–15.4)
WBC: 6.4 10*3/uL (ref 3.4–10.8)

## 2023-09-21 LAB — CMP14+EGFR
ALT: 16 IU/L (ref 0–44)
AST: 12 IU/L (ref 0–40)
Albumin: 4.6 g/dL (ref 3.8–4.9)
Alkaline Phosphatase: 60 IU/L (ref 44–121)
BUN/Creatinine Ratio: 16 (ref 9–20)
BUN: 15 mg/dL (ref 6–24)
Bilirubin Total: 0.4 mg/dL (ref 0.0–1.2)
CO2: 24 mmol/L (ref 20–29)
Calcium: 9.1 mg/dL (ref 8.7–10.2)
Chloride: 102 mmol/L (ref 96–106)
Creatinine, Ser: 0.94 mg/dL (ref 0.76–1.27)
Globulin, Total: 2.2 g/dL (ref 1.5–4.5)
Glucose: 110 mg/dL — ABNORMAL HIGH (ref 70–99)
Potassium: 4.3 mmol/L (ref 3.5–5.2)
Sodium: 143 mmol/L (ref 134–144)
Total Protein: 6.8 g/dL (ref 6.0–8.5)
eGFR: 94 mL/min/{1.73_m2} (ref >59)

## 2023-09-21 LAB — VITAMIN D 25 HYDROXY (VIT D DEFICIENCY, FRACTURES): Vit D, 25-Hydroxy: 56.6 ng/mL (ref 30.0–100.0)

## 2023-09-21 LAB — HEMOGLOBIN A1C
Est. average glucose Bld gHb Est-mCnc: 120 mg/dL
Hgb A1c MFr Bld: 5.8 % — ABNORMAL HIGH (ref 4.8–5.6)

## 2023-09-22 LAB — ENHANCED LIVER FIBROSIS (ELF): ELF(TM) Score: 8.58 (ref <9.80)

## 2023-09-23 NOTE — Assessment & Plan Note (Signed)
 Controlled He takes amlodipine 10 mg daily and metoprolol 12.5 mg daily Asymptomatic today in the clinic A low-sodium diet of less than 2,300 mg daily is recommended, along with moderate-intensity physical activity for at least 150 minutes per week. The patient is encouraged to maintain these lifestyle modifications to help manage her blood pressure effectively.  Long-term considerations were discussed, emphasizing that uncontrolled hypertension increases the risk of cardiovascular diseases, including stroke, coronary artery disease, and heart failure.  The patient is encouraged to seek emergency care if blood pressure exceeds 180/120 and is accompanied by symptoms such as headaches, chest pain, palpitations, blurred vision, or dizziness. She verbalized understanding and will follow up as scheduled.  BP Readings from Last 3 Encounters:  09/20/23 118/76  09/05/23 115/71  08/15/23 118/68

## 2023-09-23 NOTE — Assessment & Plan Note (Signed)
 Asymptomatic in the clinic Pending labs

## 2023-09-23 NOTE — Assessment & Plan Note (Signed)
 Stable on simvastatin 20 mg daily Encouraged decreasing his intake of greasy, fatty, starchy foods with increase physical activity  Lab Results  Component Value Date   CHOL 117 09/20/2023   HDL 43 09/20/2023   LDLCALC 60 09/20/2023   TRIG 65 09/20/2023   CHOLHDL 2.7 09/20/2023

## 2023-09-24 ENCOUNTER — Ambulatory Visit (INDEPENDENT_AMBULATORY_CARE_PROVIDER_SITE_OTHER): Payer: Medicaid Other | Admitting: Internal Medicine

## 2023-09-24 ENCOUNTER — Ambulatory Visit: Payer: Medicaid Other | Attending: Internal Medicine | Admitting: Internal Medicine

## 2023-09-24 ENCOUNTER — Encounter: Payer: Self-pay | Admitting: Internal Medicine

## 2023-09-24 VITALS — BP 110/72 | HR 70 | Temp 97.9°F | Resp 14 | Wt 175.0 lb

## 2023-09-24 VITALS — BP 116/56 | HR 53 | Ht 72.0 in | Wt 176.0 lb

## 2023-09-24 DIAGNOSIS — Z91018 Allergy to other foods: Secondary | ICD-10-CM | POA: Diagnosis not present

## 2023-09-24 DIAGNOSIS — J310 Chronic rhinitis: Secondary | ICD-10-CM

## 2023-09-24 DIAGNOSIS — I480 Paroxysmal atrial fibrillation: Secondary | ICD-10-CM

## 2023-09-24 MED ORDER — AZELASTINE HCL 0.1 % NA SOLN: 2.0000 | Freq: Two times a day (BID) | NASAL | 11 refills | Status: DC | PRN

## 2023-09-24 MED ORDER — FLUTICASONE PROPIONATE 50 MCG/ACT NA SUSP
2.0000 | Freq: Every day | NASAL | 11 refills | Status: DC
Start: 2023-09-24 — End: 2024-04-14

## 2023-09-24 MED ORDER — EPINEPHRINE 0.3 MG/0.3ML IJ SOAJ: 0.3000 mg | INTRAMUSCULAR | 1 refills | Status: DC | PRN

## 2023-09-24 NOTE — Progress Notes (Signed)
 HPI [name] returns for evaluation of PAF. He is a pleasant middle aged man with a h/o tobacco abuse, HTN, COPD, and sleep apnea (probable but undiagnosed), who has had trouble with PAF for over a year. He was treated with flecainide which did not improve his symptoms but then switched to [facility] and is improved. He has been on toprol as well.  He has had a cath with no obtructive CAD. He is still smoking 5-10 cigs a day but is trying to quit. He denies fever or chills. He has trouble with snoring according to his wife.   Allergies  Allergen Reactions   Alpha-Gal Anaphylaxis   Dexilant [Dexlansoprazole] Anaphylaxis   Mushroom Ext Cmplx(Shiitake-Reishi-Mait) Anaphylaxis    Rapid heart rate.   Penicillins Anaphylaxis    Has patient had a PCN reaction causing immediate rash, facial/tongue/throat swelling, SOB or lightheadedness with hypotension: Yes Has patient had a PCN reaction causing severe rash involving mucus membranes or skin necrosis: No Has patient had a PCN reaction that required hospitalization Yes Has patient had a PCN reaction occurring within the last 10 years: No If all of the above answers are "NO", then may proceed with Cephalosporin use.    Doxycycline Nausea And Vomiting          Current Outpatient Medications  Medication Sig Dispense Refill   acetaminophen (TYLENOL) 325 MG tablet Take 650 mg by mouth every 6 (six) hours as needed for moderate pain.     albuterol (VENTOLIN HFA) 108 (90 Base) MCG/ACT inhaler INHALE 2 PUFFS BY MOUTH EVERY 6 HOURS AS NEEDED FOR COUGHING, WHEEZING, OR SHORTNESS OF BREATH 20.1 g 0   ALPRAZolam (XANAX) 0.5 MG tablet Take one twice a day and two at bedtime 120 tablet 2   amLODipine (NORVASC) 10 MG tablet Take 1 tablet by mouth once daily 90 tablet 3   azelastine (ASTELIN) 0.1 % nasal spray Place 2 sprays into both nostrils 2 (two) times daily as needed. Use in each nostril as directed 30 mL 11   budesonide-formoterol (SYMBICORT)  80-4.5 MCG/ACT inhaler Take 2 puffs first thing in am and then another 2 puffs about 12 hours later. 1 each 12   dronedarone (MULTAQ) 400 MG tablet Take 1 tablet (400 mg total) by mouth 2 (two) times daily with a meal. 180 tablet 3   EPINEPHrine 0.3 mg/0.3 mL IJ SOAJ injection Inject 0.3 mg into the muscle as needed for anaphylaxis. 2 each 1   esomeprazole (NEXIUM) 40 MG capsule Take 1 capsule (40 mg total) by mouth 2 (two) times daily before a meal. 60 capsule 5   fluticasone (FLONASE) 50 MCG/ACT nasal spray Place 2 sprays into both nostrils daily. 16 g 11   KLOR-CON M20 20 MEQ tablet TAKE 1 TABLET BY MOUTH EVERY DAY 90 tablet 2   metoprolol succinate (TOPROL-XL) 25 MG 24 hr tablet TAKE 1/2 TABLET BY MOUTH EVERY DAY 45 tablet 3   nitroGLYCERIN (NITROSTAT) 0.4 MG SL tablet Place 1 tablet (0.4 mg total) under the tongue every 5 (five) minutes x 3 doses as needed for chest pain (if no relief after 3rd dose, proceed to the ED for an evaluation). 25 tablet 3   rivaroxaban (XARELTO) 20 MG TABS tablet TAKE 1 TABLET BY MOUTH ONCE DAILY WITH SUPPER 90 tablet 1   simvastatin (ZOCOR) 20 MG tablet Take 1 tablet by mouth once daily 90 tablet 3   triamcinolone cream (KENALOG) 0.1 % Apply 1 Application topically 2 (two)  times daily. 15 g 2   vitamin B-12 (CYANOCOBALAMIN) 50 MCG tablet Take 50 mcg by mouth daily.     VITAMIN D PO Take 1 tablet by mouth daily.     isosorbide mononitrate (IMDUR) 30 MG 24 hr tablet Take 1 tablet (30 mg total) by mouth daily. (Patient not taking: Reported on 09/24/2023) 90 tablet 3   No current facility-administered medications for this visit.     Past Medical History:  Diagnosis Date   Arthritis    Asthma    CAD (coronary artery disease)    a. Cardiac cath 07/2015 showed 65% distal Cx, 20% mid-distal LAD, 20% prox-distal RCA, EF 60%, EDP .   Colitis 1990   COPD (chronic obstructive pulmonary disease) (HCC)    Depression    Dysrhythmia    Essential hypertension     Fatty liver    Gastric ulcer 2003; 2012   2003: + esophagitis; negative H.pylori serology  2012: Dr. Darrick Penna, mild gastritis, Bravo PH probe placement, negative H.pylori   GERD (gastroesophageal reflux disease)    Hepatic steatosis    History of hiatal hernia    Hyperlipemia    Overweight    Panic attacks    Paroxysmal atrial fibrillation (HCC)    Pulmonary nodules    Stroke (HCC)    TIA (transient ischemic attack)    Type II diabetes mellitus (HCC)     ROS:   All systems reviewed and negative except as noted in the HPI.   Past Surgical History:  Procedure Laterality Date   BALLOON DILATION N/A 07/25/2021   Procedure: BALLOON DILATION;  Surgeon: Lanelle Bal, DO;  [facility];  Service: Endoscopy;  Laterality: N/A;   BIOPSY  07/25/2021   Procedure: BIOPSY;  Surgeon: Lanelle Bal, DO;  [facility];  Service: Endoscopy;;   BIOPSY  12/04/2022   Procedure: BIOPSY;  Surgeon: Lanelle Bal, DO;  [facility];  Service: Endoscopy;;   BRAVO PH STUDY  05/03/2011   NWG:NFAO gastritis/normal esophagus and duodenum   CARDIAC CATHETERIZATION  1990s X 1; 2005; 08/12/2015   CARDIAC CATHETERIZATION N/A 08/12/2015   Procedure: Left Heart Cath and Coronary Angiography;  Surgeon: Lyn Records, MD; LAD 20%, CFX 65%, RCA 20%, EF 60%    COLONOSCOPY  1990   COLONOSCOPY WITH PROPOFOL N/A 11/21/2016   Dr. Darrick Penna: non-thrombosed external hemorrhoids, one 6 mm polyp (polypoid lesion), internal hemorrhoids. TI Normal. 10 years screening   ESOPHAGOGASTRODUODENOSCOPY  05/03/2011   ZHY:QMVH gastritis   ESOPHAGOGASTRODUODENOSCOPY (EGD) WITH PROPOFOL N/A 07/25/2021   Procedure: ESOPHAGOGASTRODUODENOSCOPY (EGD) WITH PROPOFOL;  Surgeon: Lanelle Bal, DO;  [facility];  Service: Endoscopy;  Laterality: N/A;  1:30pm   ESOPHAGOGASTRODUODENOSCOPY (EGD) WITH PROPOFOL N/A 12/04/2022   Procedure: ESOPHAGOGASTRODUODENOSCOPY (EGD) WITH PROPOFOL;  Surgeon: Lanelle Bal, DO;  [facility];  Service: Endoscopy;  Laterality: N/A;  8:00AM;ASA 3   NECK MASS EXCISION Right    "done in dr's office; behind right ear/side of ncek"   POLYPECTOMY  11/21/2016   Procedure: POLYPECTOMY;  Surgeon: West Bali, MD;  [facility];  Service: Endoscopy;;  descending colon polyp   SHOULDER ARTHROSCOPY W/ ROTATOR CUFF REPAIR Right 2006   acromioclavicular joint arthrosis     Family History  Problem Relation Age of Onset   Lung cancer Mother    Alcohol abuse Mother    Heart attack Father 77   Diabetes Father  Alcohol abuse Father    Asthma Sister    Anxiety disorder Sister    Depression Sister    Anxiety disorder Sister    Hypertension Brother    Hypertension Brother    Heart attack Brother 55   Diabetes Brother    Hypertension Brother    Seizures Brother    Dementia Paternal Uncle    ADD / ADHD Daughter    Dementia Cousin    Colon cancer Neg Hx    Drug abuse Neg Hx    Bipolar disorder Neg Hx    OCD Neg Hx    Paranoid behavior Neg Hx    Schizophrenia Neg Hx    Sexual abuse Neg Hx    Physical abuse Neg Hx      Social History   Socioeconomic History   Marital status: Married    Spouse name: Not on file   Number of children: Not on file   Years of education: Not on file   Highest education level: Not on file  Occupational History   Occupation: full time    Employer: UNEMPLOYED  Tobacco Use   Smoking status: Every Day    Current packs/day: 0.50    Average packs/day: 1 pack/day for 41.2 years (39.8 ttl pk-yrs)    Types: Cigarettes    Start date: 07/17/1982   Smokeless tobacco: Never   Tobacco comments:    1/2 pack a day  Vaping Use   Vaping status: Never Used  Substance and Sexual Activity   Alcohol use: Not Currently   Drug use: No   Sexual activity: Yes    Birth control/protection: None  Other Topics Concern   Not on file  Social History Narrative   Pt lives in [place] with wife.  5 children.   Unemployed due to panic attacks and back pain   Social Drivers of Corporate investment banker Strain: Not on file  Food Insecurity: Not on file  Transportation Needs: Not on file  Physical Activity: Not on file  Stress: Not on file  Social Connections: Not on file  Intimate Partner Violence: Not on file     BP (!) 116/56 (BP Location: Left Arm, Patient Position: Sitting, Cuff Size: Normal)   Pulse (!) 53   Ht 6' (1.829 m)   Wt 176 lb (79.8 kg)   SpO2 97%   BMI 23.87 kg/m   Physical Exam:  Well appearing NAD HEENT: Unremarkable Neck:  No JVD, no thyromegally Lymphatics:  No adenopathy Back:  No CVA tenderness Lungs:  Clear HEART:  Regular rate rhythm, no murmurs, no rubs, no clicks Abd:  soft, positive bowel sounds, no organomegally, no rebound, no guarding Ext:  2 plus pulses, no edema, no cyanosis, no clubbing Skin:  No rashes no nodules Neuro:  CN II through XII intact, motor grossly intact  EKG  DEVICE  Normal device function.  See PaceArt for details.   Assess/Plan:  PAF - He will continue multaq. Appears to be improved though still having rare break throughs of atrial fib. I offered him the option of ablation or dofetilide but he wants to continue as he is. HTN - his bp is  controlled. No change in meds. Coags - he has not had bleeding on xarelto. 4.   Tobacco abuse - he is encouraged to go ahead and stop smoking. We will give him nicotine patches 5. Alpha Gal - I will refer him to an accupuncturist for treatment.      Sharlot Gowda  Liyat Faulkenberry,MD

## 2023-09-24 NOTE — Patient Instructions (Addendum)
 Chronic Rhinitis:  - Positive skin test 12/2022: none - Use nasal saline rinses before nose sprays such as with Neilmed Sinus Rinse.  Use distilled water.   - Use Flonase 2 sprays each nostril daily. Aim upward and outward. - Use Azelastine 2 sprays each nostril twice daily as needed for runny nose, drainage, sneezing, congestion. Aim upward and outward.  Alpha gal Syndrome - please strictly avoid all mammalian meat. Will recheck levels today, [facility] orders given.  - okay to eat seafood, chicken, [place].  - for SKIN only reaction, okay to take Benadryl 25mg  capsules every 6 hours - for SKIN + ANY additional symptoms, OR IF concern for LIFE THREATENING reaction = Epipen Autoinjector EpiPen 0.3 mg. - If using Epinephrine autoinjector, call 911 or go to the ER.

## 2023-09-24 NOTE — Patient Instructions (Signed)

## 2023-09-24 NOTE — Progress Notes (Signed)
 FOLLOW UP Date of Service/Encounter:  09/24/23   Subjective:  Richard [name] (DOB: [birth year]) is a 58 y.o. male who [facility] for follow up for chronic rhinitis and alpha gal. Followed by Pulm for COPD.  Followed by GI for GERD.  History obtained from: chart review and patient. Last visit was with me on 03/26/2023: on Flonase/Azelastine, has an Epipen.   Reports wanting repeat testing for alpha gal as he would like to eat red meat.  He does eat chicken but has a hard time chewing it.  Also eats eggs to get protein.   In terms of rhinitis, still has chronic drainage/runny nose and sinus pressure. He uses Flonase PRN rather than daily and has not been using Azelastine.  Has CT sinus tomorrow.   Past Medical History: Past Medical History:  Diagnosis Date   Arthritis    Asthma    CAD (coronary artery disease)    a. Cardiac cath 07/2015 showed 65% distal Cx, 20% mid-distal LAD, 20% prox-distal RCA, EF 60%, EDP .   Colitis 1990   COPD (chronic obstructive pulmonary disease) (HCC)    Depression    Dysrhythmia    Essential hypertension    Fatty liver    Gastric ulcer 2003; 2012   2003: + esophagitis; negative H.pylori serology  2012: Dr. Darrick Penna, mild gastritis, Bravo PH probe placement, negative H.pylori   GERD (gastroesophageal reflux disease)    Hepatic steatosis    History of hiatal hernia    Hyperlipemia    Overweight    Panic attacks    Paroxysmal atrial fibrillation (HCC)    Pulmonary nodules    Stroke (HCC)    TIA (transient ischemic attack)    Type II diabetes mellitus (HCC)     Objective:  BP 110/72   Pulse 70   Temp 97.9 F (36.6 C)   Resp 14   Wt 175 lb (79.4 kg)   SpO2 98%   BMI 23.73 kg/m  Body mass index is 23.73 kg/m. Physical Exam: GEN: alert, well developed HEENT: clear conjunctiva, nose with mild inferior turbinate hypertrophy, pink nasal mucosa, clear rhinorrhea, + cobblestoning HEART: regular  rate and rhythm, no murmur LUNGS: clear to auscultation bilaterally, no coughing, unlabored respiration SKIN: no rashes or lesions  Assessment:   1. Chronic rhinitis   2. Allergy to alpha-gal     Plan/Recommendations:  Chronic Rhinitis:  - Positive skin test 12/2022: none - Use nasal saline rinses before nose sprays such as with Neilmed Sinus Rinse.  Use distilled water.   - Use Flonase 2 sprays each nostril daily. Aim upward and outward. - Use Azelastine 2 sprays each nostril twice daily as needed for runny nose, drainage, sneezing, congestion. Aim upward and outward. - Agree with CT sinus; if abnormal, will need referral to ENT>   Alpha gal Syndrome - please strictly avoid all mammalian meat. Will recheck levels today, [facility] orders given.  - okay to eat seafood, chicken, [place].  - initial sxs: GI and intermittent palpitations.  - sIgE alpha gal 11/2022: 3.31 kU/L - for SKIN only reaction, okay to take Benadryl 25mg  capsules every 6 hours - for SKIN + ANY additional symptoms, OR IF concern for LIFE THREATENING reaction = Epipen Autoinjector EpiPen 0.3 mg. - If using Epinephrine autoinjector, call 911 or go to the ER.      Return in about 1 year (around 09/23/2024).  Alesia Morin, MD [facility]  [place]

## 2023-09-25 ENCOUNTER — Ambulatory Visit (HOSPITAL_COMMUNITY)
Admission: RE | Admit: 2023-09-25 | Discharge: 2023-09-25 | Disposition: A | Discharge status: Home / Self Care | Payer: Medicaid Other | Source: Ambulatory Visit | Attending: Internal Medicine | Admitting: Internal Medicine

## 2023-09-25 DIAGNOSIS — J342 Deviated nasal septum: Secondary | ICD-10-CM | POA: Diagnosis not present

## 2023-09-25 DIAGNOSIS — R058 Other specified cough: Secondary | ICD-10-CM | POA: Diagnosis not present

## 2023-09-25 DIAGNOSIS — J329 Chronic sinusitis, unspecified: Secondary | ICD-10-CM | POA: Diagnosis not present

## 2023-09-25 DIAGNOSIS — R059 Cough, unspecified: Secondary | ICD-10-CM | POA: Diagnosis not present

## 2023-09-28 LAB — ALPHA-GAL PANEL
Allergen Lamb IgE: 0.22 kU/L — AB
Beef IgE: 1.03 kU/L — AB
IgE (Immunoglobulin E), Serum: 94 [IU]/mL (ref 6–495)
O215-IgE Alpha-Gal: 1.29 kU/L — AB
Pork IgE: 0.57 kU/L — AB

## 2023-10-01 ENCOUNTER — Ambulatory Visit (INDEPENDENT_AMBULATORY_CARE_PROVIDER_SITE_OTHER): Payer: Medicaid Other | Admitting: Clinical

## 2023-10-01 DIAGNOSIS — F4001 Agoraphobia with panic disorder: Secondary | ICD-10-CM

## 2023-10-01 DIAGNOSIS — F331 Major depressive disorder, recurrent, moderate: Secondary | ICD-10-CM

## 2023-10-01 DIAGNOSIS — F4310 Post-traumatic stress disorder, unspecified: Secondary | ICD-10-CM | POA: Diagnosis not present

## 2023-10-01 DIAGNOSIS — F419 Anxiety disorder, unspecified: Secondary | ICD-10-CM

## 2023-10-01 NOTE — Progress Notes (Signed)
 Virtual Visit via Telephone Note  I connected with Richard [name] on 10/01/23 at  1:00 PM EDT by telephone and verified that I am speaking with the correct person using two identifiers.  Location: Patient: home Provider: office   I discussed the limitations, risks, security and privacy concerns of performing an evaluation and management service by telephone and the availability of in person appointments. I also discussed with the patient that there may be a patient responsible charge related to this service. The patient expressed understanding and agreed to proceed.     THERAPIST PROGRESS NOTE     Session Time: 1:00 PM-1:55 PM   Participation Level: Active   Behavioral Response: Casual and Alert,Anxious   Type of Therapy: Individual Therapy   Treatment Goals addressed: Mood and Anxiety   Interventions: CBT   Summary: Richard [name] is a 58 y.o. male who presents with panic disorder/depression with anxiety/and PTSD . The OPT therapist worked with the patient for his OPT treatment. The OPT therapist utilized Motivational Interviewing to assist in creating therapeutic repore. The patient in the session was engaged and work in collaboration giving feedback about his triggers and symptoms over the past few weeks.The patient spoke about getting recent results for his health condition post tick bite with levels coming down indicating his condition is improving , however, the patients wife who has been struggling with health recently found out that she has the same health problem from tick bite. The OPT therapist utilized Cognitive Behavioral Therapy through cognitive restructuring as well as worked with the patient on coping strategies to assist in management of symptoms as well as reviewed sleep, eating habits, and general health. The patient continues to work on acceptance of limited mobility based on health conditions. The patient spoke about ongoing health concerns due to mold in the home  contributing to sinus problems. The patient spoke about having his son coming to help him repair the home. The OPT therapist overviewed upcoming health appointments as listed in the patients MyChart.   Suicidal/Homicidal: Nowithout intent/plan   Therapist Response:The OPT therapist worked with the patient for the patients scheduled session. The patient was engaged in his session and gave feedback in relation to triggers, symptoms, and behavior responses over the past few weeks. The OPT therapist worked with the patient utilizing an in session Cognitive Behavioral Therapy exercise. The patient was responsive in the session and verbalized, "My son came helped and we redid the entire bathroom but he hasn't been able to come back since to help me repair other spots in home ".The OPT therapist worked with the patient providing ongoing psychotherapy/education and coping skills review.The patient spoke about the Spring season and being able to open the windows and doors and let in fresh air to help breathe better due to conditions of the home.The OPT therapist provided psycho-education throughout the session. The OPT therapist will continue treatment work with the patient in his next scheduled session   Plan: Return again in 2/3 weeks.   Diagnosis:      Axis I: PTSD/Panic Disorder/ Depression with anxiety  Axis II: No diagnosis      Collaboration of Care: No additional collaboration of care for this session.   Patient/Guardian was advised Release of Information must be obtained prior to any record release in order to collaborate their care with an outside provider. Patient/Guardian was advised if they have not already done so to contact the registration department to sign all necessary forms in order for  [name] to release information regarding their care.    Consent: Patient/Guardian gives verbal consent for treatment and assignment of benefits for services provided during this visit. Patient/Guardian expressed  understanding and agreed to proceed.    I discussed the assessment and treatment plan with the patient. The patient was provided an opportunity to ask questions and all were answered. The patient agreed with the plan and demonstrated an understanding of the instructions.   The patient was advised to call back or seek an in-person evaluation if the symptoms worsen or if the condition fails to improve as anticipated.   I provided 55 minutes of non-face-to-face time during this encounter.   [name], LCSW    10/01/2023

## 2023-10-02 NOTE — Telephone Encounter (Signed)
 Patient called back regarding lab results. The results have been read to the patient. Patient verbalized understanding.

## 2023-10-04 ENCOUNTER — Encounter: Payer: Self-pay | Admitting: Urology

## 2023-10-04 ENCOUNTER — Ambulatory Visit: Payer: Medicaid Other | Admitting: Urology

## 2023-10-04 VITALS — BP 130/75 | HR 65

## 2023-10-04 DIAGNOSIS — E291 Testicular hypofunction: Secondary | ICD-10-CM | POA: Diagnosis not present

## 2023-10-04 DIAGNOSIS — N4342 Spermatocele of epididymis, multiple: Secondary | ICD-10-CM

## 2023-10-04 LAB — URINALYSIS, ROUTINE W REFLEX MICROSCOPIC
Bilirubin, UA: NEGATIVE
Glucose, UA: NEGATIVE
Leukocytes,UA: NEGATIVE
Nitrite, UA: NEGATIVE
RBC, UA: NEGATIVE
Specific Gravity, UA: 1.03 (ref 1.005–1.030)
Urobilinogen, Ur: 1 mg/dL (ref 0.2–1.0)
pH, UA: 6 (ref 5.0–7.5)

## 2023-10-04 NOTE — Progress Notes (Unsigned)
 Subjective: 1. Hypogonadism in male   2. Spermatocele of epididymis, multiple       Consult requested by Gilmore Laroche FNP.  10/04/23: Richard [name] returns today in f/u.  A repeat testosterone panel was normal with a Total T of 579 and a free T of 68.  FSH and LH were normal.  He has not lost anymore weight.  The scrotal US shows 2 avascular lesions on the head of the left epididymis and a right epididymal cyst.   08/09/23: Richard [name] is a 58 yo male who has lost 60 lb over about 6 month period last year.  He has had an extensive w/u and no clear cause was found.  He feels he loss muscle mass.  He has progressive ED over the last year.  He has a good libido.  He has had no testicular atrophy but he has found a knot in the left testicle.  He had a scrotal US that showed some cysts several years go.  He is voiding ok with some frequency and a reduced stream. He has nocturia x 1.  His PSA has been low with the last 0.5.   ROS:  Review of Systems  Constitutional:  Positive for malaise/fatigue.  HENT:  Positive for congestion.   Eyes:  Positive for blurred vision.  Respiratory:  Positive for cough.   Musculoskeletal:  Positive for back pain and joint pain.  Neurological:  Positive for headaches.  Endo/Heme/Allergies:  Bruises/bleeds easily.  Psychiatric/Behavioral:  The patient is nervous/anxious.   All other systems reviewed and are negative.   Allergies  Allergen Reactions   Alpha-Gal Anaphylaxis   Dexilant [Dexlansoprazole] Anaphylaxis   Mushroom Ext Cmplx(Shiitake-Reishi-Mait) Anaphylaxis    Rapid heart rate.   Penicillins Anaphylaxis    Has patient had a PCN reaction causing immediate rash, facial/tongue/throat swelling, SOB or lightheadedness with hypotension: Yes Has patient had a PCN reaction causing severe rash involving mucus membranes or skin necrosis: No Has patient had a PCN reaction that required hospitalization Yes Has patient had a PCN reaction occurring within the last 10 years:  No If all of the above answers are "NO", then may proceed with Cephalosporin use.    Doxycycline Nausea And Vomiting         Past Medical History:  Diagnosis Date   Arthritis    Asthma    CAD (coronary artery disease)    a. Cardiac cath 07/2015 showed 65% distal Cx, 20% mid-distal LAD, 20% prox-distal RCA, EF 60%, EDP .   Colitis 1990   COPD (chronic obstructive pulmonary disease) (HCC)    Depression    Dysrhythmia    Essential hypertension    Fatty liver    Gastric ulcer 2003; 2012   2003: + esophagitis; negative H.pylori serology  2012: Dr. Darrick Penna, mild gastritis, Bravo PH probe placement, negative H.pylori   GERD (gastroesophageal reflux disease)    Hepatic steatosis    History of hiatal hernia    Hyperlipemia    Overweight    Panic attacks    Paroxysmal atrial fibrillation (HCC)    Pulmonary nodules    Stroke (HCC)    TIA (transient ischemic attack)    Type II diabetes mellitus (HCC)     Past Surgical History:  Procedure Laterality Date   BALLOON DILATION N/A 07/25/2021   Procedure: BALLOON DILATION;  Surgeon: Lanelle Bal, DO;  [facility];  Service: Endoscopy;  Laterality: N/A;   BIOPSY  07/25/2021   Procedure: BIOPSY;  Surgeon: Earnest Bailey  K, DO;  [facility];  Service: Endoscopy;;   BIOPSY  12/04/2022   Procedure: BIOPSY;  Surgeon: Lanelle Bal, DO;  [facility];  Service: Endoscopy;;   BRAVO PH STUDY  05/03/2011   EAV:WUJW gastritis/normal esophagus and duodenum   CARDIAC CATHETERIZATION  1990s X 1; 2005; 08/12/2015   CARDIAC CATHETERIZATION N/A 08/12/2015   Procedure: Left Heart Cath and Coronary Angiography;  Surgeon: Lyn Records, MD; LAD 20%, CFX 65%, RCA 20%, EF 60%    COLONOSCOPY  1990   COLONOSCOPY WITH PROPOFOL N/A 11/21/2016   Dr. Darrick Penna: non-thrombosed external hemorrhoids, one 6 mm polyp (polypoid lesion), internal hemorrhoids. TI Normal. 10 years screening   ESOPHAGOGASTRODUODENOSCOPY  05/03/2011    JXB:JYNW gastritis   ESOPHAGOGASTRODUODENOSCOPY (EGD) WITH PROPOFOL N/A 07/25/2021   Procedure: ESOPHAGOGASTRODUODENOSCOPY (EGD) WITH PROPOFOL;  Surgeon: Lanelle Bal, DO;  [facility];  Service: Endoscopy;  Laterality: N/A;  1:30pm   ESOPHAGOGASTRODUODENOSCOPY (EGD) WITH PROPOFOL N/A 12/04/2022   Procedure: ESOPHAGOGASTRODUODENOSCOPY (EGD) WITH PROPOFOL;  Surgeon: Lanelle Bal, DO;  [facility];  Service: Endoscopy;  Laterality: N/A;  8:00AM;ASA 3   NECK MASS EXCISION Right    "done in dr's office; behind right ear/side of ncek"   POLYPECTOMY  11/21/2016   Procedure: POLYPECTOMY;  Surgeon: West Bali, MD;  [facility];  Service: Endoscopy;;  descending colon polyp   SHOULDER ARTHROSCOPY W/ ROTATOR CUFF REPAIR Right 2006   acromioclavicular joint arthrosis    Social History   Socioeconomic History   Marital status: Married    Spouse name: Not on file   Number of children: Not on file   Years of education: Not on file   Highest education level: Not on file  Occupational History   Occupation: full time    Employer: UNEMPLOYED  Tobacco Use   Smoking status: Every Day    Current packs/day: 0.50    Average packs/day: 1 pack/day for 41.2 years (39.9 ttl pk-yrs)    Types: Cigarettes    Start date: 07/17/1982   Smokeless tobacco: Never   Tobacco comments:    1/2 pack a day  Vaping Use   Vaping status: Never Used  Substance and Sexual Activity   Alcohol use: Not Currently   Drug use: No   Sexual activity: Yes    Birth control/protection: None  Other Topics Concern   Not on file  Social History Narrative   Pt lives in [place] with wife.  5 children.  Unemployed due to panic attacks and back pain   Social Drivers of Corporate investment banker Strain: Not on file  Food Insecurity: Not on file  Transportation Needs: Not on file  Physical Activity: Not on file  Stress: Not on file  Social Connections: Not on file  Intimate  Partner Violence: Not on file    Family History  Problem Relation Age of Onset   Lung cancer Mother    Alcohol abuse Mother    Heart attack Father 57   Diabetes Father    Alcohol abuse Father    Asthma Sister    Anxiety disorder Sister    Depression Sister    Anxiety disorder Sister    Hypertension Brother    Hypertension Brother    Heart attack Brother 40   Diabetes Brother    Hypertension Brother    Seizures Brother    Dementia Paternal Uncle    ADD / ADHD Daughter  Dementia Cousin    Colon cancer Neg Hx    Drug abuse Neg Hx    Bipolar disorder Neg Hx    OCD Neg Hx    Paranoid behavior Neg Hx    Schizophrenia Neg Hx    Sexual abuse Neg Hx    Physical abuse Neg Hx     Anti-infectives: Anti-infectives (From admission, onward)    None       Current Outpatient Medications  Medication Sig Dispense Refill   acetaminophen (TYLENOL) 325 MG tablet Take 650 mg by mouth every 6 (six) hours as needed for moderate pain.     albuterol (VENTOLIN HFA) 108 (90 Base) MCG/ACT inhaler INHALE 2 PUFFS BY MOUTH EVERY 6 HOURS AS NEEDED FOR COUGHING, WHEEZING, OR SHORTNESS OF BREATH 20.1 g 0   ALPRAZolam (XANAX) 0.5 MG tablet Take one twice a day and two at bedtime 120 tablet 2   amLODipine (NORVASC) 10 MG tablet Take 1 tablet by mouth once daily 90 tablet 3   azelastine (ASTELIN) 0.1 % nasal spray Place 2 sprays into both nostrils 2 (two) times daily as needed. Use in each nostril as directed 30 mL 11   budesonide-formoterol (SYMBICORT) 80-4.5 MCG/ACT inhaler Take 2 puffs first thing in am and then another 2 puffs about 12 hours later. 1 each 12   dronedarone (MULTAQ) 400 MG tablet Take 1 tablet (400 mg total) by mouth 2 (two) times daily with a meal. 180 tablet 3   EPINEPHrine 0.3 mg/0.3 mL IJ SOAJ injection Inject 0.3 mg into the muscle as needed for anaphylaxis. 2 each 1   esomeprazole (NEXIUM) 40 MG capsule Take 1 capsule (40 mg total) by mouth 2 (two) times daily before a meal. 60  capsule 5   fluticasone (FLONASE) 50 MCG/ACT nasal spray Place 2 sprays into both nostrils daily. 16 g 11   isosorbide mononitrate (IMDUR) 30 MG 24 hr tablet Take 1 tablet (30 mg total) by mouth daily. 90 tablet 3   KLOR-CON M20 20 MEQ tablet TAKE 1 TABLET BY MOUTH EVERY DAY 90 tablet 2   metoprolol succinate (TOPROL-XL) 25 MG 24 hr tablet TAKE 1/2 TABLET BY MOUTH EVERY DAY 45 tablet 3   nitroGLYCERIN (NITROSTAT) 0.4 MG SL tablet Place 1 tablet (0.4 mg total) under the tongue every 5 (five) minutes x 3 doses as needed for chest pain (if no relief after 3rd dose, proceed to the ED for an evaluation). 25 tablet 3   rivaroxaban (XARELTO) 20 MG TABS tablet TAKE 1 TABLET BY MOUTH ONCE DAILY WITH SUPPER 90 tablet 1   simvastatin (ZOCOR) 20 MG tablet Take 1 tablet by mouth once daily 90 tablet 3   triamcinolone cream (KENALOG) 0.1 % Apply 1 Application topically 2 (two) times daily. 15 g 2   vitamin B-12 (CYANOCOBALAMIN) 50 MCG tablet Take 50 mcg by mouth daily.     VITAMIN D PO Take 1 tablet by mouth daily.     No current facility-administered medications for this visit.     Objective: Vital signs in last 24 hours: BP 130/75   Pulse 65   Intake/Output from previous day: No intake/output data recorded. Intake/Output this shift: @IOTHISSHIFT @   Physical Exam Vitals reviewed.  Constitutional:      Appearance: Normal appearance.  Genitourinary:    Comments: Normal scrotum. Testes slightly small. Left epdidymal lesions are not easily felt.  They didn't clearly transiluminate.  Right epididymal cyst not easily felt.  Neurological:     Mental Status:  He is alert.     Lab Results:  Results for orders placed or performed in visit on 10/04/23 (from the past 24 hours)  Urinalysis, Routine w reflex microscopic     Status: Abnormal   Collection Time: 10/04/23  2:04 PM  Result Value Ref Range   Specific Gravity, UA 1.030 1.005 - 1.030   pH, UA 6.0 5.0 - 7.5   Color, UA Yellow Yellow    Appearance Ur Clear Clear   Leukocytes,UA Negative Negative   Protein,UA Trace Negative/Trace   Glucose, UA Negative Negative   Ketones, UA Trace (A) Negative   RBC, UA Negative Negative   Bilirubin, UA Negative Negative   Urobilinogen, Ur 1.0 0.2 - 1.0 mg/dL   Nitrite, UA Negative Negative   Microscopic Examination Comment    Narrative   Performed at:  [address] - [facility] [address] [place], [place]  [id number] Lab Director: Chinita Pester MT, Phone:  [phone]   *Note: Due to a large number of results and/or encounters for the requested time period, some results have not been displayed. A complete set of results can be found in Results Review.     BMET No results for input(s): "NA", "K", "CL", "CO2", "GLUCOSE", "BUN", "CREATININE", "CALCIUM" in the last 72 hours.  PT/INR No results for input(s): "LABPROT", "INR" in the last 72 hours. ABG No results for input(s): "PHART", "HCO3" in the last 72 hours.  Invalid input(s): "PCO2", "PO2" Recent Results (from the past 2160 hours)  Basic metabolic panel     Status: Abnormal   Collection Time: 08/06/23 10:15 PM  Result Value Ref Range   Sodium 138 135 - 145 mmol/L   Potassium 3.4 (L) 3.5 - 5.1 mmol/L   Chloride 104 98 - 111 mmol/L   CO2 25 22 - 32 mmol/L   Glucose, Bld 156 (H) 70 - 99 mg/dL    Comment: Glucose reference range applies only to samples taken after fasting for at least 8 hours.   BUN 16 6 - 20 mg/dL   Creatinine, Ser 8.65 0.61 - 1.24 mg/dL   Calcium 8.9 8.9 - 78.4 mg/dL   GFR, Estimated >69 >62 mL/min    Comment: (NOTE) Calculated using the CKD-EPI Creatinine Equation (2021)    Anion gap 9 5 - 15    Comment: Performed at [facility], [address], [place], [place] [address]  CBC     Status: None   Collection Time: 08/06/23 10:15 PM  Result Value Ref Range   WBC 7.4 4.0 - 10.5 K/uL   RBC 4.70 4.22 - 5.81 MIL/uL   Hemoglobin 15.0 13.0 - 17.0 g/dL   HCT 13.2 44.0 - 10.2 %   MCV 91.1 80.0 - 100.0  fL   MCH 31.9 26.0 - 34.0 pg   MCHC 35.0 30.0 - 36.0 g/dL   RDW 72.5 36.6 - 44.0 %   Platelets 183 150 - 400 K/uL   nRBC 0.0 0.0 - 0.2 %    Comment: Performed at [facility], [address], [place], [place] [address]  Troponin I (High Sensitivity)     Status: None   Collection Time: 08/06/23 10:15 PM  Result Value Ref Range   Troponin I (High Sensitivity) 3 <18 ng/L    Comment: (NOTE) Elevated high sensitivity troponin I (hsTnI) values and significant  changes across serial measurements may suggest ACS but many other  chronic and acute conditions are known to elevate hsTnI results.  Refer to the "Links" section for  chest pain algorithms and additional  guidance. Performed at [facility], [address] [place], [place] [address]   Magnesium     Status: None   Collection Time: 08/06/23 10:15 PM  Result Value Ref Range   Magnesium 2.1 1.7 - 2.4 mg/dL    Comment: Performed at [facility], [address], [place], [place] [address]  TSH     Status: None   Collection Time: 08/06/23 10:15 PM  Result Value Ref Range   TSH 1.595 0.350 - 4.500 uIU/mL    Comment: Performed by a 3rd Generation assay with a functional sensitivity of <=0.01 uIU/mL. Performed at [facility], [address] [place], [place] [address]   Troponin I (High Sensitivity)     Status: None   Collection Time: 08/07/23 12:22 AM  Result Value Ref Range   Troponin I (High Sensitivity) 8 <18 ng/L    Comment: (NOTE) Elevated high sensitivity troponin I (hsTnI) values and significant  changes across serial measurements may suggest ACS but many other  chronic and acute conditions are known to elevate hsTnI results.  Refer to the "Links" section for chest pain algorithms and additional  guidance. Performed at [facility], [address], [place], [place] [address]   Urinalysis, Routine w reflex microscopic     Status: Abnormal   Collection Time: 08/09/23 11:41 AM  Result Value Ref Range   Specific Gravity, UA 1.030  1.005 - 1.030   pH, UA 6.0 5.0 - 7.5   Color, UA Yellow Yellow   Appearance Ur Clear Clear   Leukocytes,UA Negative Negative   Protein,UA Trace Negative/Trace   Glucose, UA Negative Negative   Ketones, UA Trace (A) Negative   RBC, UA Trace (A) Negative   Bilirubin, UA Negative Negative   Urobilinogen, Ur 0.2 0.2 - 1.0 mg/dL   Nitrite, UA Negative Negative   Microscopic Examination See below:     Comment: Microscopic was indicated and was performed.  Microscopic Examination     Status: Abnormal   Collection Time: 08/09/23 11:41 AM   Urine  Result Value Ref Range   WBC, UA None seen 0 - 5 /hpf   RBC, Urine 0-2 0 - 2 /hpf   Epithelial Cells (non renal) 0-10 0 - 10 /hpf   Mucus, UA Present (A) Not Estab.   Bacteria, UA None seen None seen/Few  Basic Metabolic Panel (BMET)     Status: None   Collection Time: 08/16/23  8:40 AM  Result Value Ref Range   Glucose 98 70 - 99 mg/dL   BUN 14 6 - 24 mg/dL   Creatinine, Ser 6.57 0.76 - 1.27 mg/dL   eGFR 93 >84 ON/GEX/5.28   BUN/Creatinine Ratio 15 9 - 20   Sodium 144 134 - 144 mmol/L   Potassium 4.0 3.5 - 5.2 mmol/L   Chloride 104 96 - 106 mmol/L   CO2 23 20 - 29 mmol/L   Calcium 9.2 8.7 - 10.2 mg/dL  Magnesium     Status: None   Collection Time: 08/16/23  8:40 AM  Result Value Ref Range   Magnesium 2.1 1.6 - 2.3 mg/dL  Luteinizing hormone     Status: None   Collection Time: 08/16/23  9:13 AM  Result Value Ref Range   LH 3.8 1.7 - 8.6 mIU/mL  Estradiol     Status: None   Collection Time: 08/16/23  9:13 AM  Result Value Ref Range   Estradiol 30.8 7.6 - 42.6 pg/mL    Comment:  Roche ECLIA methodology  Follicle stimulating hormone     Status: None   Collection Time: 08/16/23  9:13 AM  Result Value Ref Range   FSH 5.7 1.5 - 12.4 mIU/mL  Testosterone Free, Profile I     Status: None   Collection Time: 08/16/23  9:13 AM  Result Value Ref Range   Albumin 4.5 3.8 - 4.9 g/dL   Testosterone 409 811 - 916 ng/dL    Comment: Adult male  reference interval is based on a population of healthy nonobese males (BMI <30) between 33 and 9 years old. Travison, et.al. JCEM [phone]. PMID: [id number].    Sex Hormone Binding 74.6 19.3 - 76.4 nmol/L   Testost., Free, Calc 68.3 35.8 - 168.2 pg/mL  Pulmonary Function Test     Status: None   Collection Time: 09/13/23 10:05 AM  Result Value Ref Range   FVC-Pre 5.02 L   FVC-%Pred-Pre 97 %   FVC-Post 5.15 L   FVC-%Pred-Post 99 %   FVC-%Change-Post 2 %   FEV1-Pre 3.77 L   FEV1-%Pred-Pre 95 %   FEV1-Post 3.89 L   FEV1-%Pred-Post 98 %   FEV1-%Change-Post 3 %   FEV6-Pre 4.96 L   FEV6-%Pred-Pre 100 %   FEV6-Post 4.97 L   FEV6-%Pred-Post 100 %   FEV6-%Change-Post 0 %   Pre FEV1/FVC ratio 75 %   FEV1FVC-%Pred-Pre 98 %   Post FEV1/FVC ratio 75 %   FEV1FVC-%Change-Post 0 %   Pre FEV6/FVC Ratio 99 %   FEV6FVC-%Pred-Pre 103 %   Post FEV6/FVC ratio 97 %   FEV6FVC-%Pred-Post 100 %   FEV6FVC-%Change-Post -2 %   FEF 25-75 Pre 2.96 L/sec   FEF2575-%Pred-Pre 90 %   FEF 25-75 Post 3.49 L/sec   FEF2575-%Pred-Post 106 %   FEF2575-%Change-Post 17 %   RV 2.73 L   RV % pred 118 %   TLC 7.86 L   TLC % pred 106 %   DLCO unc 29.57 ml/min/mmHg   DLCO unc % pred 100 %   DL/VA 9.52 ml/min/mmHg/L   DL/VA % pred 841 %  Hemoglobin A1c     Status: Abnormal   Collection Time: 09/20/23 11:11 AM  Result Value Ref Range   Hgb A1c MFr Bld 5.8 (H) 4.8 - 5.6 %    Comment:          Prediabetes: 5.7 - 6.4          Diabetes: >6.4          Glycemic control for adults with diabetes: <7.0    Est. average glucose Bld gHb Est-mCnc 120 mg/dL  VITAMIN D 25 Hydroxy (Vit-D Deficiency, Fractures)     Status: None   Collection Time: 09/20/23 11:11 AM  Result Value Ref Range   Vit D, 25-Hydroxy 56.6 30.0 - 100.0 ng/mL    Comment: Vitamin D deficiency has been defined by the Institute of Medicine and an Endocrine Society practice guideline as a level of serum 25-OH vitamin D less than 20 ng/mL  (1,2). The Endocrine Society went on to further define vitamin D insufficiency as a level between 21 and 29 ng/mL (2). 1. IOM (Institute of Medicine). 2010. Dietary reference    intakes for calcium and D. [place]: The    [employer]. 2. Holick MF, Binkley Wittmann, Bischoff-Ferrari HA, et al.    Evaluation, treatment, and prevention of vitamin D    deficiency: an Endocrine Society clinical practice    guideline. JCEM. 2011 Jul; 96(7):1911-30.   TSH +  free T4     Status: None   Collection Time: 09/20/23 11:11 AM  Result Value Ref Range   TSH 1.330 0.450 - 4.500 uIU/mL   Free T4 1.39 0.82 - 1.77 ng/dL  Lipid panel     Status: None   Collection Time: 09/20/23 11:11 AM  Result Value Ref Range   Cholesterol, Total 117 100 - 199 mg/dL   Triglycerides 65 0 - 149 mg/dL   HDL 43 >16 mg/dL   VLDL Cholesterol Cal 14 5 - 40 mg/dL   LDL Chol Calc (NIH) 60 0 - 99 mg/dL   Chol/HDL Ratio 2.7 0.0 - 5.0 ratio    Comment:                                   T. Chol/HDL Ratio                                             Men  Women                               1/2 Avg.Risk  3.4    3.3                                   Avg.Risk  5.0    4.4                                2X Avg.Risk  9.6    7.1                                3X Avg.Risk 23.4   11.0   CMP14+EGFR     Status: Abnormal   Collection Time: 09/20/23 11:11 AM  Result Value Ref Range   Glucose 110 (H) 70 - 99 mg/dL   BUN 15 6 - 24 mg/dL   Creatinine, Ser 1.09 0.76 - 1.27 mg/dL   eGFR 94 >60 AV/WUJ/8.11   BUN/Creatinine Ratio 16 9 - 20   Sodium 143 134 - 144 mmol/L   Potassium 4.3 3.5 - 5.2 mmol/L   Chloride 102 96 - 106 mmol/L   CO2 24 20 - 29 mmol/L   Calcium 9.1 8.7 - 10.2 mg/dL   Total Protein 6.8 6.0 - 8.5 g/dL   Albumin 4.6 3.8 - 4.9 g/dL   Globulin, Total 2.2 1.5 - 4.5 g/dL   Bilirubin Total 0.4 0.0 - 1.2 mg/dL   Alkaline Phosphatase 60 44 - 121 IU/L   AST 12 0 - 40 IU/L   ALT 16 0 - 44 IU/L  CBC with  Differential/Platelet     Status: None   Collection Time: 09/20/23 11:11 AM  Result Value Ref Range   WBC 6.4 3.4 - 10.8 x10E3/uL   RBC 4.94 4.14 - 5.80 x10E6/uL   Hemoglobin 16.0 13.0 - 17.7 g/dL   Hematocrit 91.4 78.2 - 51.0 %   MCV 94 79 - 97 fL   MCH 32.4 26.6 - 33.0 pg   MCHC 34.6 31.5 - 35.7 g/dL   RDW 95.6 21.3 -  15.4 %   Platelets 185 150 - 450 x10E3/uL   Neutrophils 62 Not Estab. %   Lymphs 26 Not Estab. %   Monocytes 8 Not Estab. %   Eos 3 Not Estab. %   Basos 1 Not Estab. %   Neutrophils Absolute 4.0 1.4 - 7.0 x10E3/uL   Lymphocytes Absolute 1.7 0.7 - 3.1 x10E3/uL   Monocytes Absolute 0.5 0.1 - 0.9 x10E3/uL   EOS (ABSOLUTE) 0.2 0.0 - 0.4 x10E3/uL   Basophils Absolute 0.0 0.0 - 0.2 x10E3/uL   Immature Granulocytes 0 Not Estab. %   Immature Grans (Abs) 0.0 0.0 - 0.1 x10E3/uL  Enhanced Liver Fibrosis (ELF)     Status: None   Collection Time: 09/20/23 11:12 AM  Result Value Ref Range   ELF(TM) Score 8.58 <9.80    Comment: ELF(TM) Score Interpretation: Risk cut-offs to assess the likelihood of progression to cirrhosis and liver-related clinical events within 3.9 years following baseline ELF score (IQR: 14.0-22.4 months)*:                           Lower risk             < 9.80                           Mid risk         9.80 - 11.29                           Higher risk            >11.29 Note: The ELF(TM) Score is a unitless numerical value. Romeo Apple SA, Wong VW, [place] T, et al. Selonsertib for patients with bridging fibrosis or compensated cirrhosis due to NASH: Results from randomized phase III STELLAR trials. J Hepatol. 2020 Jul;73(1):26-39.   Alpha-Gal Panel     Status: Abnormal   Collection Time: 09/24/23  2:15 PM  Result Value Ref Range   Class Description Allergens Comment     Comment:     Levels of Specific IgE       Class  Description of Class     ---------------------------  -----  --------------------                    < 0.10         0          Negative            0.10 -    0.31         0/I       Equivocal/Low            0.32 -    0.55         I         Low            0.56 -    1.40         II        Moderate            1.41 -    3.90         III       High            3.91 -   19.00         IV        Very High  19.01 -  100.00         V         Very High                   >100.00         VI        Very High    IgE (Immunoglobulin E), Serum 94 6 - 495 IU/mL   Pork IgE 0.57 (A) Class II kU/L   Beef IgE 1.03 (A) Class II kU/L   Allergen Lamb IgE 0.22 (A) Class 0/I kU/L   O215-IgE Alpha-Gal 1.29 (A) Class II kU/L  Urinalysis, Routine w reflex microscopic     Status: Abnormal   Collection Time: 10/04/23  2:04 PM  Result Value Ref Range   Specific Gravity, UA 1.030 1.005 - 1.030   pH, UA 6.0 5.0 - 7.5   Color, UA Yellow Yellow   Appearance Ur Clear Clear   Leukocytes,UA Negative Negative   Protein,UA Trace Negative/Trace   Glucose, UA Negative Negative   Ketones, UA Trace (A) Negative   RBC, UA Negative Negative   Bilirubin, UA Negative Negative   Urobilinogen, Ur 1.0 0.2 - 1.0 mg/dL   Nitrite, UA Negative Negative   Microscopic Examination Comment     Comment: Microscopic not indicated and not performed.    Studies/Results: No results found. I reviewed his scrotal US from 2002 and he had right epididymal cysts. I reviewed his CT from 2022 and he has aortoiliac atherosclerosis.  US SCROTUM W/DOPPLER Result Date: 08/16/2023 : PROCEDURE: ULTRASOUND SCROTUM DOPPLER COMPLETE HISTORY: Patient is a 58 y/o  M with left spermatocele. COMPARISON: None available. TECHNIQUE: Two-dimensional grayscale, color and spectral Doppler ultrasound of the scrotum and testicles was performed. FINDINGS: The right testicle measures 4.1 x 3.0 x 2.2 cm demonstrates a normal echotexture with normal color and spectral Doppler flow. There are no intratesticular masses or calcifications. The right epididymal head measures 1.2 x 0.6 cm and  demonstrates a simple cyst measuring 0.5 cm. The left testicle measures 5.0 x 2.9 x 2.0 cm demonstrates a normal echotexture with normal color and spectral Doppler flow. There are no intratesticular masses or calcifications. The left epididymal head measures 1.0 x 1.0 cm and demonstrates normal echotexture. There are two avascular, complex lesions measuring 0.9 x 0.9 cm 0.70.7 cm identified superior to the left testicle. There are no hydroceles or varicoceles. IMPRESSION: 1. Unremarkable ultrasound of the testicles. 2.  Right epididymal cyst. 3. Two subcentimeter, avascular, complex lesions identified superior to the left testicle. Thank you for allowing [name] to assist in the care of this patient. Electronically Signed   By: Lestine Box M.D.   On: 08/16/2023 15:58   DG Chest 2 View Result Date: 08/06/2023 CLINICAL DATA:  Palpitations and chest pressure EXAM: CHEST - 2 VIEW COMPARISON:  06/01/2023 FINDINGS: The heart size and mediastinal contours are within normal limits. Both lungs are clear. The visualized skeletal structures are unremarkable. IMPRESSION: No active cardiopulmonary disease. Electronically Signed   By: Minerva Fester M.D.   On: 08/06/2023 22:46    Assessment/Plan: Low Testosterone and unexplained weight loss.   His repeat free and total testosterone and FSH and LH were normal.  No treatment needed.   Left Epididymal cyst. He had two small avascular, complex structures on the superior aspect of the left testicle that are probably spermatoceles with increased echogenic fluid.  He has no pain and they are not readily palpable.  I will  just repeat a scrotal US in 4 months to assess stability.  An MRI could be considered if there is growth.  He has multiple comorbidities so it would be preferred to avoid surgery.   ED.  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 He is not having success with smoking cessation.   No orders of the defined types  were placed in this encounter.    Orders Placed This Encounter  Procedures   US SCROTUM W/DOPPLER    Standing Status:   Future    Expected Date:   02/07/2024    Expiration Date:   04/05/2024    Reason for Exam (SYMPTOM  OR DIAGNOSIS REQUIRED):   Left epdidymal lesion    Preferred imaging location?:   [facility]   Urinalysis, Routine w reflex microscopic     Return in about 4 months (around 02/03/2024) for any provider with scrotal US. .    CC: Gilmore Laroche FNP.     Bjorn Pippin 10/05/2023

## 2023-10-06 NOTE — Progress Notes (Unsigned)
 Subjective:    Patient ID: Richard [name], male    DOB: [birth year],    MRN: [id number]  HPI  53 yowm active smoker with onset of doe around 2011 much worse x 2015-16 to point where has trouble to walking to mailbox x 168ft slt uphill to [place]  so referred to pulmonary clinic 11/17/2015 by Dr Ledell Peoples.   11/17/2015 1st [facility] office visit/ Eero Dini  On advair 250 bid  Chief Complaint  Patient presents with   pulmonary consult    pt ref by dr. Ledell Peoples for SOB. dry cough occ prod, wheezing mainly @ night, occ cp.   indolent onset doe x 6 years worse with certain smells and some better on advair/ventolin as long as avoids exertion but  wheezing every night x one year disturbs sleep On nexium bid but not ac Rec Plan A = Automatic = Symbicort 80 Take 2 puffs first thing in am and then another 2 puffs about 12 hours later.  Plan B = Backup Only use your albuterol(ventolin)  as a rescue medication  Stop lisinopril and start valsartan 80 mg one daily in its place   nexium Take 30- 60 min before your first and last meals of the day  GERD diet  Please schedule a follow up office visit in 4 weeks, sooner if needed > did not return as rec   03/14/2022  Re-establish ov/[place] office/Deberah Adolf re: AB maint on saba prn   Chief Complaint  Patient presents with   Consult    Consult for pulm emphysema. Lung pain and SOB   Dyspnea:  several [employer]isles at [employer] variable speeds then gives out  Cough: varies during the day, not in AM/ just mucoid / powdered inhalers make it worse  Sleeping: bed has 2.5 inches or bad gerd  SABA use: up to 3 x days , never prechallenges 02: checks levels 20 min p arrives  Covid status: no vaccines, never infected  Lung cancer screening: had ct 09/2021 Rec Nexium 40 mg Take 30- 60 min before your first and last meals of the day  GERD diet reviewed, bed blocks rec  Breztri one puff every 12 hours - if helps can try dulera or symbiocrt thru patient assistance Only use your  albuterol as a rescue medication  Ok to try albuterol 15 min before an activity (on alternating days)  that you know would usually make you short of breath     04/25/2022  f/u ov/[place] office/Zehra Rucci re: doe  maint on nothing  / breztri made him cough Chief Complaint  Patient presents with   Follow-up    Breathing has not changed since last ov   Dyspnea:  [employer] walking /5 squats / able to cross a half parking   Cough: clear phlegy Sleeping: slt elevation  SABA use: none on day of, up to twice daily as needed but does not pre-challenge or rechallenge as rec  02: none  Covid status: never 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  x 5 years > filed for disability  Rec Plan A = Automatic = Always=    Stiolto 2 puffs each am daily  Work on inhaler technique:   Plan B = Backup (to supplement plan A, not to replace it) Only use your albuterol inhaler as a rescue medication Ok to try albuterol 15 min before an activity (on alternating days)  that you know would usually make you short of breath   Get rid of the mold and cigarettes  Pulmonary  follow up is as  needed   01/02/2023  f/u ov/[place] office/Thoma Paulsen re: doe still smoking  maint on prn saba   Chief Complaint  Patient presents with   Follow-up  Dyspnea:  highly variable / worse in heat Cough: better off stiolto  Sleeping: bed blocks no resp cc SABA use: ventolin seems to help the most maybe up to twice daily   02: none  Rec Stop stiolto  Stop smoking completely before smoking completely stops you! Plan A = Automatic = Always=    Symbicort 80 Take 2 puffs first thing in am and then another 2 puffs about 12 hours later take for at least a week to get full benefit before considering any taper to 0-2 puffs every 12 hours as needed  Work on inhaler technique Plan B = Backup (to supplement plan A, not to replace it) Only use your albuterol inhaler as a rescue medication Please schedule a follow up visit in 6 months but call sooner if  needed     07/04/2023  f/u ov/[place] office/Shedric Fredericks re: AB maint on symbicort 80  3-4 x per week in am / not typically in pm / and still smoking  Chief Complaint  Patient presents with   Shortness of Breath  Dyspnea:  can walk entire [employer] [place] pace   Cough: variably  thick mostly white not using mucinex  Sleeping: bed blocks s resp cc s  am cough until stands  SABA use: not as much / did not bring it Rec    10/08/2023  f/u ov/[place] office/Jaelah Hauth re: *** maint on ***  No chief complaint on file.   Dyspnea:  *** Cough: *** Sleeping: ***   resp cc  SABA use: *** 02: ***  Lung cancer screening: ***   No obvious day to day or daytime variability or assoc excess/ purulent sputum or mucus plugs or hemoptysis or cp or chest tightness, subjective wheeze or overt sinus or hb symptoms.    Also denies any obvious fluctuation of symptoms with weather or environmental changes or other aggravating or alleviating factors except as outlined above   No unusual exposure hx or h/o childhood pna/ asthma or knowledge of premature birth.  Current Allergies, Complete Past Medical History, Past Surgical History, Family History, and Social History were reviewed in [employer] record.  ROS  The following are not active complaints unless bolded Hoarseness, sore throat, dysphagia, dental problems, itching, sneezing,  nasal congestion or discharge of excess mucus or purulent secretions, ear ache,   fever, chills, sweats, unintended wt loss or wt gain, classically pleuritic or exertional cp,  orthopnea pnd or arm/hand swelling  or leg swelling, presyncope, palpitations, abdominal pain, anorexia, nausea, vomiting, diarrhea  or change in bowel habits or change in bladder habits, change in stools or change in urine, dysuria, hematuria,  rash, arthralgias, visual complaints, headache, numbness, weakness or ataxia or problems with walking or coordination,  change in mood or  memory.         No outpatient medications have been marked as taking for the 10/08/23 encounter (Appointment) with Nyoka Cowden, MD.                  Objective:   Physical Exam   Wts  10/08/2023          ***  07/04/2023       172  01/02/2023         170  04/25/2022  173  03/14/2022        175  11/17/15 207 lb 9.6 oz (94.167 kg)  10/14/15 205 lb 6.4 oz (93.169 kg)  10/07/15 204 lb (92.534 kg)     Vital signs reviewed  10/08/2023  - Note at rest 02 sats  ***% on ***   General appearance:    ***    Min barr***    Assessment & Plan:

## 2023-10-07 ENCOUNTER — Other Ambulatory Visit: Payer: Self-pay | Admitting: Cardiology

## 2023-10-08 ENCOUNTER — Encounter: Payer: Self-pay | Admitting: Internal Medicine

## 2023-10-08 ENCOUNTER — Ambulatory Visit (INDEPENDENT_AMBULATORY_CARE_PROVIDER_SITE_OTHER): Payer: Medicaid Other | Admitting: Internal Medicine

## 2023-10-08 VITALS — BP 117/68 | HR 61 | Ht 72.0 in | Wt 175.0 lb

## 2023-10-08 DIAGNOSIS — F17210 Nicotine dependence, cigarettes, uncomplicated: Secondary | ICD-10-CM | POA: Diagnosis not present

## 2023-10-08 DIAGNOSIS — R058 Other specified cough: Secondary | ICD-10-CM | POA: Diagnosis not present

## 2023-10-08 NOTE — Patient Instructions (Addendum)
 nasal steroids (flonase)  have no immediate benefit in terms of improving symptoms.  To help them reached the target tissue,  you should use Afrin two puffs every 12 hours applied one min before using the nasal steroids.  Afrin should be stopped after no more than 5 days.  If the symptoms worsen, Afrin can be restarted after 5 days off of therapy to prevent rebound congestion from overuse of Afrin.  I also emphasized that in no way are nasal steroids a concern in terms of "addiction".   My office will be contacting you by phone for referral to [facility]   - if you don't hear back from my office within one week please call us back or notify us thru MyChart and we'll address it right away.   GERD (REFLUX)  is an extremely common cause of respiratory symptoms just like yours , many times with no obvious heartburn at all.    It can be treated with medication, but also with lifestyle changes including elevation of the head of your bed (ideally with 6 -8inch blocks under the headboard of your bed),  Smoking cessation, avoidance of late meals, excessive alcohol, and avoid fatty foods, chocolate, peppermint, colas, red wine, and acidic juices such as orange juice.  NO MINT OR MENTHOL PRODUCTS SO NO COUGH DROPS  USE SUGARLESS CANDY INSTEAD (Jolley ranchers or Stover's or Life Savers) or even ice chips will also do - the key is to swallow to prevent all throat clearing. NO OIL BASED VITAMINS - use powdered substitutes.  Avoid fish oil when coughing.    Please schedule a follow up visit in 3 months but call sooner if needed  with all medications /inhalers/ solutions in hand so we can verify exactly what you are taking. This includes all medications from all doctors and over the counters

## 2023-10-08 NOTE — Telephone Encounter (Signed)
 Prescription refill request for Xarelto received.  Indication: PAF Last office visit: 09/24/23  Rosette Reveal MD Weight: 79.8kg Age: 58 Scr: 0.94 on 09/20/23 CrCl: 96.68  Based on above findings Xarelto 20mg  daily is the appropriate dose.  Refill approved.

## 2023-10-09 ENCOUNTER — Telehealth: Payer: Self-pay | Admitting: Internal Medicine

## 2023-10-09 NOTE — Assessment & Plan Note (Addendum)
 4-5 min discussion re active cigarette smoking in addition to office E&M  Ask about tobacco use:   ongoing Advise quitting   I took an extended  opportunity with this patient to outline the consequences of continued cigarette use  in airway disorders based on all the data we have from the multiple national lung health studies (perfomed over decades at millions of dollars in cost)  indicating that smoking cessation, not choice of inhalers or pulmonary physicians, is the most important aspect of his care.   Assess willingness:  Not committed at this point Assist in quit attempt:  Per PCP when ready Arrange follow up:   Follow up per Primary Care planned   Low-dose CT lung cancer screening is recommended for patients who are 35-66 years of age with a 20+ pack-year history of smoking and who are currently smoking or quit <=15 years ago. No coughing up blood  No unintentional weight loss of > 15 pounds in the last 6 months - pt is eligible for scanning yearly until 15 y p quits > referred

## 2023-10-09 NOTE — Telephone Encounter (Signed)
 Called and spoke with patient regarding this , he said that he is agreeable to this . That he has done this in the past.

## 2023-10-09 NOTE — Assessment & Plan Note (Signed)
 Quit ACEi  11/2015  - intol of DPI's  Reported 03/14/2022  - max rx for GERD 03/14/2022 >>> improved as of 01/02/2023 > try adding symb 80  bid and if cough flares more likely this is UACS than asthma related > f/u with allergy either way and here q 6 m, sooner prn  -  CT sinus  09/25/23  No air fluid levels> rx topically with afrin / flonase  - ENT consult 10/08/2023 >>>   His nasal complaints are dominant now so rec ENT eval next and hold abx in absence of air fluid levels  No change in AB rx   F/u q 3 m          Each maintenance medication was reviewed in detail including emphasizing most importantly the difference between maintenance and prns and under what circumstances the prns are to be triggered using an action plan format where appropriate.  Total time for H and P, chart review, counseling, reviewing hfa/ nasal  device(s) and generating customized AVS unique to this office visit / same day charting = 23 min

## 2023-10-09 NOTE — Telephone Encounter (Signed)
 Let him know I rec he talk to the lung cancer screening program this year - not rush - unless he declines, let him know they will be calling

## 2023-10-27 DIAGNOSIS — Z419 Encounter for procedure for purposes other than remedying health state, unspecified: Secondary | ICD-10-CM | POA: Diagnosis not present

## 2023-11-05 ENCOUNTER — Ambulatory Visit (INDEPENDENT_AMBULATORY_CARE_PROVIDER_SITE_OTHER): Admitting: Clinical

## 2023-11-05 DIAGNOSIS — F331 Major depressive disorder, recurrent, moderate: Secondary | ICD-10-CM

## 2023-11-05 DIAGNOSIS — F32A Depression, unspecified: Secondary | ICD-10-CM

## 2023-11-05 DIAGNOSIS — F410 Panic disorder [episodic paroxysmal anxiety] without agoraphobia: Secondary | ICD-10-CM | POA: Diagnosis not present

## 2023-11-05 DIAGNOSIS — F4310 Post-traumatic stress disorder, unspecified: Secondary | ICD-10-CM | POA: Diagnosis not present

## 2023-11-05 DIAGNOSIS — F419 Anxiety disorder, unspecified: Secondary | ICD-10-CM

## 2023-11-05 DIAGNOSIS — I10 Essential (primary) hypertension: Secondary | ICD-10-CM

## 2023-11-05 DIAGNOSIS — F4001 Agoraphobia with panic disorder: Secondary | ICD-10-CM

## 2023-11-05 NOTE — Progress Notes (Signed)
 Virtual Visit via Video Note  I connected with Richard [name] on 11/05/23 at  1:00 PM EDT by a video enabled telemedicine application and verified that I am speaking with the correct person using two identifiers.  Location: Patient: home Provider: office   I discussed the limitations of evaluation and management by telemedicine and the availability of in person appointments. The patient expressed understanding and agreed to proceed.    THERAPIST PROGRESS NOTE     Session Time: 1:00 PM-1:45 PM   Participation Level: Active   Behavioral Response: Casual and Alert,Anxious   Type of Therapy: Individual Therapy   Treatment Goals addressed: Mood and Anxiety   Interventions: CBT   Summary: Richard [name] is a 58 y.o. male who presents with panic disorder/depression with anxiety/and PTSD . The OPT therapist worked with the patient for his OPT treatment. The OPT therapist utilized Motivational Interviewing to assist in creating therapeutic repore. The patient in the session was engaged and work in collaboration giving feedback about his triggers and symptoms over the past few weeks.The patient spoke about his wife who had a fall and broke her left shoulder. The patient has been working to help her recover, but due to the pain she has not been sleeping and this has effected the patients sleep. The patient spoke about working on at home repair projects.The OPT therapist utilized Cognitive Behavioral Therapy through cognitive restructuring as well as worked with the patient on coping strategies to assist in management of symptoms as well as reviewed sleep, eating habits, and general health. The patient continues to work on acceptance of limited mobility based on health conditions. The patient spoke about delays in trying to fix the home he and his wife are living in and needing a electrician to help repair electrical problems in the home. The OPT therapist overviewed upcoming health appointments as  listed in the patients MyChart.   Suicidal/Homicidal: Nowithout intent/plan   Therapist Response:The OPT therapist worked with the patient for the patients scheduled session. The patient was engaged in his session and gave feedback in relation to triggers, symptoms, and behavior responses over the past few weeks.The patient spoke about his wife having a fall and breaking her shoulder. The patient spoke about during the Spring working on in home repairs. The OPT therapist worked with the patient utilizing an in session Cognitive Behavioral Therapy exercise. The patient was responsive in the session and verbalized, "My wife fell at home and broke her shoulder falling on one of the pet pads that was wet ".The OPT therapist worked with the patient providing ongoing psychotherapy/education and coping skills review. The patients wife was not able to get out of the floor after her fall due to her injury and the patient took her to local ED. The patient spoke about the ongoing impact of his physical health condition (Alpha-gal) from tick bite. The patient spoke about the at home fall risk from having cats in the home.The OPT therapist provided psycho-education throughout the session. The OPT therapist will continue treatment work with the patient in his next scheduled session   Plan: Return again in 2/3 weeks.   Diagnosis:      Axis I: PTSD/Panic Disorder/ Depression with anxiety  Axis II: No diagnosis      Collaboration of Care: No additional collaboration of care for this session.   Patient/Guardian was advised Release of Information must be obtained prior to any record release in order to collaborate their care with an outside provider.  Patient/Guardian was advised if they have not already done so to contact the registration department to sign all necessary forms in order for us  to release information regarding their care.    Consent: Patient/Guardian gives verbal consent for treatment and assignment of  benefits for services provided during this visit. Patient/Guardian expressed understanding and agreed to proceed.    I discussed the assessment and treatment plan with the patient. The patient was provided an opportunity to ask questions and all were answered. The patient agreed with the plan and demonstrated an understanding of the instructions.   The patient was advised to call back or seek an in-person evaluation if the symptoms worsen or if the condition fails to improve as anticipated.   I provided 45 minutes of non-face-to-face time during this encounter.   Lea Primmer, LCSW    11/05/2023

## 2023-11-12 ENCOUNTER — Encounter (HOSPITAL_COMMUNITY): Payer: Self-pay | Admitting: Psychiatry

## 2023-11-12 ENCOUNTER — Telehealth (INDEPENDENT_AMBULATORY_CARE_PROVIDER_SITE_OTHER): Payer: Medicaid Other | Admitting: Psychiatry

## 2023-11-12 DIAGNOSIS — F4001 Agoraphobia with panic disorder: Secondary | ICD-10-CM | POA: Diagnosis not present

## 2023-11-12 MED ORDER — ALPRAZOLAM 0.5 MG PO TABS: ORAL_TABLET | ORAL | 2 refills | Status: DC

## 2023-11-12 NOTE — Progress Notes (Signed)
 Virtual Visit via Video Note  I connected with Richard [name] on 11/12/23 at 11:00 AM EDT by a video enabled telemedicine application and verified that I am speaking with the correct person using two identifiers.  Location: Patient: home Provider: own   I discussed the limitations of evaluation and management by telemedicine and the availability of in person appointments. The patient expressed understanding and agreed to proceed.  :    I discussed the assessment and treatment plan with the patient. The patient was provided an opportunity to ask questions and all were answered. The patient agreed with the plan and demonstrated an understanding of the instructions.   The patient was advised to call back or seek an in-person evaluation if the symptoms worsen or if the condition fails to improve as anticipated.  I provided 20 minutes of non-face-to-face time during this encounter.   Alfredia Annas, MD  [facility] MD/PA/NP OP Progress Note  11/12/2023 11:15 AM Richard [name]  MRN:  [id number]  Chief Complaint:  Chief Complaint  Patient presents with   Anxiety   Follow-up   HPI:  This patient is a 58 year old white male lives with his wife in [place].  He is on an unemployment and also gets SSI disability.   The patient returns for follow-up after 3 months regarding his anxiety and panic attacks  The patient states he has been doing about the same.  His wife recently fell and injured her shoulder and is in a sling.  He is having to do a lot around the house and also to help her with her basic hygiene chores.  Overall his mood has been stable.  He does get depressed at times but it does not seem to last.  The Xanax  continues to help his anxiety and sleep.  He states that without it he would not be able to function.  He still gets symptoms of atrial fibrillation but sometimes it is difficult to differentiate this from anxiety. Visit Diagnosis:    ICD-10-CM   1. 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 Cardiac cath 07/2015 showed 65% distal Cx, 20% mid-distal LAD, 20% prox-distal RCA, EF 60%, EDP .   Colitis 1990   COPD (chronic obstructive pulmonary disease) (HCC)    Depression    Dysrhythmia    Essential hypertension    Fatty liver    Gastric ulcer 2003; 2012   2003: + esophagitis; negative H.pylori serology  2012: Dr. Nolene Baumgarten, mild gastritis, Bravo PH probe placement, negative H.pylori   GERD (gastroesophageal reflux disease)    Hepatic steatosis    History of hiatal hernia    Hyperlipemia    Overweight    Panic attacks    Paroxysmal atrial fibrillation (HCC)    Pulmonary nodules    Stroke (HCC)    TIA (transient ischemic attack)    Type II diabetes mellitus (HCC)     Past Surgical History:  Procedure Laterality Date   BALLOON DILATION N/A 07/25/2021   Procedure: BALLOON DILATION;  Surgeon: Vinetta Greening, DO;  [facility];  Service: Endoscopy;  Laterality: N/A;   BIOPSY  07/25/2021   Procedure: BIOPSY;  Surgeon: Vinetta Greening, DO;  [facility];  Service: Endoscopy;;   BIOPSY  12/04/2022   Procedure: BIOPSY;  Surgeon: Vinetta Greening, DO;  [facility];  Service: Endoscopy;;   BRAVO PH STUDY  05/03/2011   HUT:MLYY gastritis/normal esophagus and duodenum   CARDIAC CATHETERIZATION  1990s X 1; 2005; 08/12/2015   CARDIAC CATHETERIZATION N/A 08/12/2015   Procedure: Left Heart Cath and Coronary Angiography;  Surgeon: Arty Binning, MD; LAD 20%, CFX 65%, RCA 20%, EF 60%    COLONOSCOPY  1990   COLONOSCOPY WITH PROPOFOL  N/A 11/21/2016   Dr. Nolene Baumgarten: non-thrombosed external hemorrhoids, one 6 mm polyp (polypoid lesion), internal hemorrhoids. TI Normal. 10 years screening   ESOPHAGOGASTRODUODENOSCOPY  05/03/2011   TKP:TWSF gastritis    ESOPHAGOGASTRODUODENOSCOPY (EGD) WITH PROPOFOL  N/A 07/25/2021   Procedure: ESOPHAGOGASTRODUODENOSCOPY (EGD) WITH PROPOFOL ;  Surgeon: Vinetta Greening, DO;  [facility];  Service: Endoscopy;  Laterality: N/A;  1:30pm   ESOPHAGOGASTRODUODENOSCOPY (EGD) WITH PROPOFOL  N/A 12/04/2022   Procedure: ESOPHAGOGASTRODUODENOSCOPY (EGD) WITH PROPOFOL ;  Surgeon: Vinetta Greening, DO;  [facility];  Service: Endoscopy;  Laterality: N/A;  8:00AM;ASA 3   NECK MASS EXCISION Right    "done in dr's office; behind right ear/side of ncek"   POLYPECTOMY  11/21/2016   Procedure: POLYPECTOMY;  Surgeon: Alyce Jubilee, MD;  [facility];  Service: Endoscopy;;  descending colon polyp   SHOULDER ARTHROSCOPY W/ ROTATOR CUFF REPAIR Right 2006   acromioclavicular joint arthrosis    Family Psychiatric History: See below  Family History:  Family History  Problem Relation Age of Onset   Lung cancer Mother    Alcohol abuse Mother    Heart attack Father 35   Diabetes Father    Alcohol abuse Father    Asthma Sister    Anxiety disorder Sister    Depression Sister    Anxiety disorder Sister    Hypertension Brother    Hypertension Brother    Heart attack Brother 47   Diabetes Brother    Hypertension Brother    Seizures Brother    Dementia Paternal Uncle    ADD / ADHD Daughter    Dementia Cousin    Colon cancer Neg Hx    Drug abuse Neg Hx    Bipolar disorder Neg Hx    OCD Neg Hx    Paranoid behavior Neg Hx    Schizophrenia Neg Hx    Sexual abuse Neg Hx    Physical abuse Neg Hx     Social History:  Social History   Socioeconomic History   Marital status: Married    Spouse name: Not on file   Number of children: Not on file   Years of education: Not on file   Highest education level: Not on file  Occupational History   Occupation: full time    Employer: UNEMPLOYED  Tobacco Use   Smoking status: Every Day    Current packs/day: 0.50    Average packs/day: 1 pack/day for  41.3 years (39.9 ttl pk-yrs)    Types: Cigarettes    Start date: 07/17/1982   Smokeless tobacco: Never   Tobacco comments:    1/2 pack a day  Vaping Use   Vaping status: Never Used  Substance and Sexual Activity   Alcohol use: Not Currently   Drug use: No   Sexual activity: Yes    Birth control/protection: None  Other Topics Concern   Not on file  Social History Narrative   Pt lives in [place] with wife.  5 children.  Unemployed due  to panic attacks and back pain   Social Drivers of Corporate investment banker Strain: Not on file  Food Insecurity: Not on file  Transportation Needs: Not on file  Physical Activity: Not on file  Stress: Not on file  Social Connections: Not on file    Allergies:  Allergies  Allergen Reactions   Alpha-Gal Anaphylaxis   Dexilant  [Dexlansoprazole ] Anaphylaxis   Mushroom Ext Cmplx(Shiitake-Reishi-Mait) Anaphylaxis    Rapid heart rate.   Penicillins Anaphylaxis    Has patient had a PCN reaction causing immediate rash, facial/tongue/throat swelling, SOB or lightheadedness with hypotension: Yes Has patient had a PCN reaction causing severe rash involving mucus membranes or skin necrosis: No Has patient had a PCN reaction that required hospitalization Yes Has patient had a PCN reaction occurring within the last 10 years: No If all of the above answers are "NO", then may proceed with Cephalosporin use.    Doxycycline Nausea And Vomiting         Metabolic Disorder Labs: Lab Results  Component Value Date   HGBA1C 5.8 (H) 09/20/2023   MPG 111.15 02/16/2022   MPG 114.02 11/09/2021   No results found for: "PROLACTIN" Lab Results  Component Value Date   CHOL 117 09/20/2023   TRIG 65 09/20/2023   HDL 43 09/20/2023   CHOLHDL 2.7 09/20/2023   VLDL 8 06/13/2022   LDLCALC 60 09/20/2023   LDLCALC 61 06/19/2023   Lab Results  Component Value Date   TSH 1.330 09/20/2023   TSH 1.595 08/06/2023    Therapeutic Level Labs: No results  found for: "LITHIUM" No results found for: "VALPROATE" No results found for: "CBMZ"  Current Medications: Current Outpatient Medications  Medication Sig Dispense Refill   acetaminophen  (TYLENOL ) 325 MG tablet Take 650 mg by mouth every 6 (six) hours as needed for moderate pain.     albuterol  (VENTOLIN  HFA) 108 (90 Base) MCG/ACT inhaler INHALE 2 PUFFS BY MOUTH EVERY 6 HOURS AS NEEDED FOR COUGHING, WHEEZING, OR SHORTNESS OF BREATH 20.1 g 0   ALPRAZolam  (XANAX ) 0.5 MG tablet Take one twice a day and two at bedtime 120 tablet 2   amLODipine  (NORVASC ) 10 MG tablet Take 1 tablet by mouth once daily 90 tablet 3   azelastine  (ASTELIN ) 0.1 % nasal spray Place 2 sprays into both nostrils 2 (two) times daily as needed. Use in each nostril as directed 30 mL 11   budesonide -formoterol  (SYMBICORT ) 80-4.5 MCG/ACT inhaler Take 2 puffs first thing in am and then another 2 puffs about 12 hours later. 1 each 12   dronedarone  (MULTAQ ) 400 MG tablet Take 1 tablet (400 mg total) by mouth 2 (two) times daily with a meal. 180 tablet 3   EPINEPHrine  0.3 mg/0.3 mL IJ SOAJ injection Inject 0.3 mg into the muscle as needed for anaphylaxis. 2 each 1   esomeprazole  (NEXIUM ) 40 MG capsule Take 1 capsule (40 mg total) by mouth 2 (two) times daily before a meal. 60 capsule 5   fluticasone  (FLONASE ) 50 MCG/ACT nasal spray Place 2 sprays into both nostrils daily. 16 g 11   isosorbide  mononitrate (IMDUR ) 30 MG 24 hr tablet Take 1 tablet (30 mg total) by mouth daily. 90 tablet 3   KLOR-CON  M20 20 MEQ tablet TAKE 1 TABLET BY MOUTH EVERY DAY 90 tablet 2   metoprolol  succinate (TOPROL -XL) 25 MG 24 hr tablet TAKE 1/2 TABLET BY MOUTH EVERY DAY 45 tablet 3   nitroGLYCERIN  (NITROSTAT ) 0.4 MG SL tablet Place 1 tablet (0.4  mg total) under the tongue every 5 (five) minutes x 3 doses as needed for chest pain (if no relief after 3rd dose, proceed to the ED for an evaluation). 25 tablet 3   rivaroxaban  (XARELTO ) 20 MG TABS tablet TAKE 1 TABLET  BY MOUTH ONCE DAILY WITH SUPPER 90 tablet 1   simvastatin  (ZOCOR ) 20 MG tablet Take 1 tablet by mouth once daily 90 tablet 3   triamcinolone  cream (KENALOG ) 0.1 % Apply 1 Application topically 2 (two) times daily. 15 g 2   vitamin B-12 (CYANOCOBALAMIN ) 50 MCG tablet Take 50 mcg by mouth daily.     VITAMIN D  PO Take 1 tablet by mouth daily.     No current facility-administered medications for this visit.     Musculoskeletal: Strength & Muscle Tone: within normal limits Gait & Station: normal Patient leans: N/A  Psychiatric Specialty Exam: Review of Systems  Cardiovascular:  Positive for palpitations.  Psychiatric/Behavioral:  The patient is nervous/anxious.   All other systems reviewed and are negative.   There were no vitals taken for this visit.There is no height or weight on file to calculate BMI.  General Appearance: Casual and Fairly Groomed  Eye Contact:  Good  Speech:  Clear and Coherent  Volume:  Normal  Mood:  Anxious and Euthymic  Affect:  Congruent  Thought Process:  Goal Directed  Orientation:  Full (Time, Place, and Person)  Thought Content: WDL   Suicidal Thoughts:  No  Homicidal Thoughts:  No  Memory:  Immediate;   Good Recent;   Good Remote;   NA  Judgement:  Good  Insight:  Fair  Psychomotor Activity:  Decreased  Concentration:  Concentration: Good and Attention Span: Good  Recall:  Good  Fund of Knowledge: Good  Language: Good  Akathisia:  No  Handed:  Right  AIMS (if indicated): not done  Assets:  Communication Skills Desire for Improvement Resilience Social Support  ADL's:  Intact  Cognition: WNL  Sleep:  Fair   Screenings: GAD-7    Flowsheet Row Office Visit from 08/14/2023 in [facility] Office Visit from 07/03/2023 in [facility] Office Visit from 06/18/2023 in [facility] Office Visit from 02/12/2023 in [facility] Office Visit from 12/05/2022 in  [facility]  Total GAD-7 Score 0 8 0 9 7      PHQ2-9    Flowsheet Row Office Visit from 08/14/2023 in [facility] Office Visit from 07/03/2023 in [facility] Office Visit from 06/18/2023 in [facility] Office Visit from 02/12/2023 in [facility] Office Visit from 12/05/2022 in [facility]  PHQ-2 Total Score 0 2 0 2 2  PHQ-9 Total Score 0 9 0 6 5      Flowsheet Row ED from 08/06/2023 in [facility] Emergency Department at [facility] Most recent reading at 08/06/2023  9:41 PM ED from 06/01/2023 in [facility] Emergency Department at [facility] Most recent reading at 06/01/2023 10:54 PM ED from 06/01/2023 in [facility] at [place] Most recent reading at 06/01/2023  1:00 PM  C-SSRS RISK CATEGORY No Risk No Risk No Risk        Assessment and Plan: This patient is a 58 year old male with a history of anxiety and panic attacks with agoraphobia.  He still feels the Xanax  is helpful so he will  continue Xanax  0.5 mg twice daily for anxiety and 1 mg at bedtime for anxiety and sleep.  He will return to see me in 3 months  Collaboration of Care: Collaboration of Care: Referral or follow-up with counselor/therapist AEB patient will continue therapy with Secundino Dach in our office  Patient/Guardian was advised Release of Information must be obtained prior to any record release in order to collaborate their care with an outside provider. Patient/Guardian was advised if they have not already done so to contact the registration department to sign all necessary forms in order for us  to release information regarding their care.   Consent: Patient/Guardian gives verbal consent for treatment and assignment of benefits for services provided during this visit. Patient/Guardian expressed understanding and agreed to proceed.    Alfredia Annas,  MD 11/12/2023, 11:15 AM

## 2023-11-20 ENCOUNTER — Ambulatory Visit: Payer: Self-pay

## 2023-11-20 ENCOUNTER — Ambulatory Visit (INDEPENDENT_AMBULATORY_CARE_PROVIDER_SITE_OTHER): Admitting: Family Medicine

## 2023-11-20 ENCOUNTER — Encounter: Payer: Self-pay | Admitting: Family Medicine

## 2023-11-20 ENCOUNTER — Ambulatory Visit (HOSPITAL_COMMUNITY)
Admission: RE | Admit: 2023-11-20 | Discharge: 2023-11-20 | Disposition: A | Discharge status: Home / Self Care | Source: Ambulatory Visit | Attending: Family Medicine | Admitting: Family Medicine

## 2023-11-20 VITALS — BP 121/74 | HR 63 | Resp 18 | Ht 72.0 in | Wt 174.1 lb

## 2023-11-20 DIAGNOSIS — F17210 Nicotine dependence, cigarettes, uncomplicated: Secondary | ICD-10-CM | POA: Diagnosis not present

## 2023-11-20 DIAGNOSIS — R058 Other specified cough: Secondary | ICD-10-CM | POA: Diagnosis not present

## 2023-11-20 DIAGNOSIS — I10 Essential (primary) hypertension: Secondary | ICD-10-CM

## 2023-11-20 DIAGNOSIS — J449 Chronic obstructive pulmonary disease, unspecified: Secondary | ICD-10-CM | POA: Diagnosis not present

## 2023-11-20 DIAGNOSIS — F32A Depression, unspecified: Secondary | ICD-10-CM | POA: Diagnosis not present

## 2023-11-20 DIAGNOSIS — F419 Anxiety disorder, unspecified: Secondary | ICD-10-CM

## 2023-11-20 DIAGNOSIS — R059 Cough, unspecified: Secondary | ICD-10-CM | POA: Diagnosis not present

## 2023-11-20 MED ORDER — CLINDAMYCIN PALMITATE HCL 75 MG/5ML PO SOLR: 300.0000 mg | Freq: Three times a day (TID) | ORAL | 0 refills | Status: DC

## 2023-11-20 NOTE — Telephone Encounter (Signed)
  Chief Complaint: Cough/Shortness of breath Symptoms: productive cough with brown mucus, chest feels "raw" and mild-moderate shortness of breath worse with exertion Frequency: started a couple of weeks ago Pertinent Negatives: Patient denies fever Disposition: [] ED /[] Urgent Care (no appt availability in office) / [x] Appointment(In office/virtual)/ []  [place] [facility]/ [] Home Care/ [] Refused Recommended Disposition /[] [place] [facility]/ []  Follow-up with PCP Additional Notes: patient with complex history calling with concerns for cough and shortness of breath. Patient states he has been having a productive cough with brown mucus, "chest feels raw" and mild-moderate shortness of breath which is worse with activity. Per protocol, patient is recommended to be seen today. PCP no availability but other providers in office are available. Patient agrees to an appointment at 2:00 PM today which is scheduled. Patient verbalized understanding of the plan and all questions answered.    Copied from CRM [id number]. Topic: Clinical - Red Word Triage >> Nov 20, 2023 11:57 AM Tiffany B wrote: Red Word that prompted transfer to Nurse Triage: Coughing up mucus and chest feels raw. When patient gets active he's experiencing difficulty breathing. Reason for Disposition  [1] MILD difficulty breathing (e.g., minimal/no SOB at rest, SOB with walking, pulse <100) AND [2] still present when not coughing  Answer Assessment - Initial Assessment Questions 1. ONSET: "When did the cough begin?"      Started a couple of weeks ago and is worse in the morning 2. SEVERITY: "How bad is the cough today?"      4 out of 10 3. SPUTUM: "Describe the color of your sputum" (none, dry cough; clear, white, yellow, green)     Thick and brown 4. HEMOPTYSIS: "Are you coughing up any blood?" If so ask: "How much?" (flecks, streaks, tablespoons, etc.)     no 5. DIFFICULTY BREATHING: "Are you having difficulty breathing?" If Yes,  ask: "How bad is it?" (e.g., mild, moderate, severe)    - MILD: No SOB at rest, mild SOB with walking, speaks normally in sentences, can lie down, no retractions, pulse < 100.    - MODERATE: SOB at rest, SOB with minimal exertion and prefers to sit, cannot lie down flat, speaks in phrases, mild retractions, audible wheezing, pulse 100-120.    - SEVERE: Very SOB at rest, speaks in single words, struggling to breathe, sitting hunched forward, retractions, pulse > 120      No SOB at this moment-SOB with activity-depends on how active he is 6. FEVER: "Do you have a fever?" If Yes, ask: "What is your temperature, how was it measured, and when did it start?"     no 7. CARDIAC HISTORY: "Do you have any history of heart disease?" (e.g., heart attack, congestive heart failure)      Hx of blockages in his heart 8. LUNG HISTORY: "Do you have any history of lung disease?"  (e.g., pulmonary embolus, asthma, emphysema)     bronchitis 9. PE RISK FACTORS: "Do you have a history of blood clots?" (or: recent major surgery, recent prolonged travel, bedridden)     no 10. OTHER SYMPTOMS: "Do you have any other symptoms?" (e.g., runny nose, wheezing, chest pain)       Runny nose 12. TRAVEL: "Have you traveled out of the country in the last month?" (e.g., travel history, exposures)       no  Protocols used: Cough - Acute Productive-A-AH

## 2023-11-20 NOTE — Patient Instructions (Addendum)
 F/u with PCP as before, call if you need to be seen sooner  YOU ARE TREAD FOR ACUTE COUGH WHICH MAY BE RESULTING IN BRONCHITIS OR PNEUMONIA   cXR today after you leave at the hospital, result will be sent to you  Clindamycin  is prescribed for 3 times daily for 10 days  If your symptoms persist/ worsen or fail to resolve pls let me know, additional tests will be ordered as deemed necessRY  pLEASE WORK ON CUTTING BACK SMOKING WITH A PLAN TO QUIT  Thanks for choosing [facility], we consider it a privelige to serve you.

## 2023-11-26 ENCOUNTER — Ambulatory Visit: Payer: Self-pay | Admitting: *Deleted

## 2023-11-26 DIAGNOSIS — Z419 Encounter for procedure for purposes other than remedying health state, unspecified: Secondary | ICD-10-CM | POA: Diagnosis not present

## 2023-11-26 NOTE — Telephone Encounter (Signed)
 Copied from CRM [id number]. Topic: Clinical - Red Word Triage >> Nov 26, 2023  2:46 PM Baldomero Bone wrote: Red Word that prompted transfer to Nurse Triage: Christy, gum and teeth hurting along with an ulcer after starting the clindamycin  (CLEOCIN ) 75 MG/5ML solution.   yesterday morning was the last time he took the medication. Callback number is [phone] Reason for Disposition  [1] Caller has NON-URGENT medicine question about med that PCP prescribed AND [2] triager unable to answer question  Answer Assessment - Initial Assessment Questions 1. NAME of MEDICINE: "What medicine(s) are you calling about?"     Cleocin  75 mg/5 ml     He was on it 5 days.  Gums and teeth hurting.   His mouth is breaking out in ulcers.   Carolin Chyle is the last day he took it.   Maybe he is having a reaction to it?   He was on it for respiratory coughing.        Wife is calling in.   He has ear problems too that this is for.    He has taken this before and this has never happened. He is not coughing as much now.    Got 5 days of it.   He said the raw feeling is better.    2. QUESTION: "What is your question?" (e.g., double dose of medicine, side effect)     He has one ulcer that came up on his right side of his cheek inside.   It looked like an ulcer.   It busted yesterday.   It's clearing up now.   It feels like it's starting to heal.   3. PRESCRIBER: "Who prescribed the medicine?" Reason: if prescribed by specialist, call should be referred to that group.     Dr. Zarwolo 4. SYMPTOMS: "Do you have any symptoms?" If Yes, ask: "What symptoms are you having?"  "How bad are the symptoms (e.g., mild, moderate, severe)     Ulcer in mouth that came up after he started taking the Cleocin  antibiotic solution. 5. PREGNANCY:  "Is there any chance that you are pregnant?" "When was your last menstrual period?"     N/A  Protocols used: Medication Question Call-A-AH  Chief Complaint: Pt was seen by Dr. Rodolph Clap on 11/20/2023 for URI  symptoms and coughing.   Prescribed Cleocin  75 mg/5 ml solution. Symptoms: He had an ulcer come up on the right inside of his cheek.   His gums and teeth started hurting too.   Is this a reaction to the Cleocin ?   He stopped taking it yesterday and his teeth and gums feel better today.   The ulcer busted yesterday and is healing and feeling better today.   He got 5 days of the antibiotic in him.    Does Dr. Rodolph Clap want him to take something else to complete the therapy?    Frequency: Last dose was yesterday because pt stopped taking it due to what he thought may be a reaction to the Cleocin . Pertinent Negatives: Patient denies the ulcer looking infected.   Per wife it looks like it is healing today.   He is feeling much better and not coughing near like he was. Disposition: [] ED /[] Urgent Care (no appt availability in office) / [] Appointment(In office/virtual)/ []  [place] [facility]/ [] Home Care/ [] Refused Recommended Disposition /[] [place] [facility]/ [x]  Follow-up with PCP Additional Notes: Message sent to Dr. Rodolph Clap to see if she wants to to take something else for the remainder of  the antibiotic treatment or not.  It was prescribed for a total of 10 days.   He got 5 days worth.

## 2023-11-28 NOTE — Telephone Encounter (Signed)
 LVM to CB to see if patient is agreeable to submit sample for testing in order to move forward with treatment

## 2023-11-29 ENCOUNTER — Other Ambulatory Visit (HOSPITAL_COMMUNITY)
Admission: RE | Admit: 2023-11-29 | Discharge: 2023-11-29 | Disposition: A | Discharge status: Home / Self Care | Source: Ambulatory Visit | Attending: Student | Admitting: Student

## 2023-11-29 ENCOUNTER — Ambulatory Visit: Payer: Self-pay | Admitting: Student

## 2023-11-29 ENCOUNTER — Encounter: Payer: Self-pay | Admitting: Student

## 2023-11-29 ENCOUNTER — Ambulatory Visit: Payer: Medicaid Other | Attending: Student | Admitting: Student

## 2023-11-29 VITALS — BP 110/62 | HR 59 | Ht 72.0 in | Wt 176.2 lb

## 2023-11-29 DIAGNOSIS — I6523 Occlusion and stenosis of bilateral carotid arteries: Secondary | ICD-10-CM | POA: Diagnosis not present

## 2023-11-29 DIAGNOSIS — I2510 Atherosclerotic heart disease of native coronary artery without angina pectoris: Secondary | ICD-10-CM | POA: Insufficient documentation

## 2023-11-29 DIAGNOSIS — I10 Essential (primary) hypertension: Secondary | ICD-10-CM | POA: Insufficient documentation

## 2023-11-29 DIAGNOSIS — Z79899 Other long term (current) drug therapy: Secondary | ICD-10-CM | POA: Diagnosis not present

## 2023-11-29 DIAGNOSIS — E785 Hyperlipidemia, unspecified: Secondary | ICD-10-CM | POA: Diagnosis not present

## 2023-11-29 DIAGNOSIS — I480 Paroxysmal atrial fibrillation: Secondary | ICD-10-CM | POA: Insufficient documentation

## 2023-11-29 LAB — BASIC METABOLIC PANEL WITH GFR
Anion gap: 8 (ref 5–15)
BUN: 14 mg/dL (ref 6–20)
CO2: 24 mmol/L (ref 22–32)
Calcium: 8.9 mg/dL (ref 8.9–10.3)
Chloride: 104 mmol/L (ref 98–111)
Creatinine, Ser: 0.9 mg/dL (ref 0.61–1.24)
GFR, Estimated: >60 mL/min (ref >60)
Glucose, Bld: 112 mg/dL — ABNORMAL HIGH (ref 70–99)
Potassium: 4 mmol/L (ref 3.5–5.1)
Sodium: 136 mmol/L (ref 135–145)

## 2023-11-29 LAB — MAGNESIUM: Magnesium: 2.1 mg/dL (ref 1.7–2.4)

## 2023-11-29 MED ORDER — METOPROLOL SUCCINATE ER 25 MG PO TB24: 12.5000 mg | ORAL_TABLET | Freq: Every day | ORAL | 3 refills | Status: DC

## 2023-11-29 NOTE — Patient Instructions (Signed)
 Medication Instructions:   You can take an extra Toprol -XL 12.5mg  if needed for persistent palpitations.   *If you need a refill on your cardiac medications before your next appointment, please call your pharmacy*  Lab Work:  BMET and Magnesium  today.   If you have labs (blood work) drawn today and your tests are completely normal, you will receive your results only by: MyChart Message (if you have MyChart) OR A paper copy in the mail If you have any lab test that is abnormal or we need to change your treatment, we will call you to review the results.   Follow-Up: At [facility], you and your health needs are our priority.  As part of our continuing mission to provide you with exceptional heart care, our providers are all part of one team.  This team includes your primary Cardiologist (physician) and Advanced Practice Providers or APPs (Physician Assistants and Nurse Practitioners) who all work together to provide you with the care you need, when you need it.  Your next appointment:   4-5 months.   Provider:   You may see Richard Favre, MD or one of the following Advanced Practice Providers on your designated Care Team:   Woodfin Hays, PA-C  [place], [place] Theotis Flake, [place]     We recommend signing up for the patient portal called "MyChart".  Sign up information is provided on this After Visit Summary.  MyChart is used to connect with patients for Virtual Visits (Telemedicine).  Patients are able to view lab/test results, encounter notes, upcoming appointments, etc.  Non-urgent messages can be sent to your provider as well.   To learn more about what you can do with MyChart, go to [email or web address].

## 2023-11-29 NOTE — Progress Notes (Signed)
 Cardiology Office Note    Date:  11/29/2023  ID:  ROWNAN [name], DOB [birth year], MRN [id number] Cardiologist: Teddie Favre, MD   EP: Dr. Carolynne Citron  History of Present Illness:    Richard [name] is a 58 y.o. male with past medical history of CAD (nonobstructive disease by cath in 07/2015, low-risk NST in 12/2020, Coronary CTA in 04/2023 showing 50-69% scattered stenoses but FFR only positive along distal vessels and medical management recommended unless refractory symptoms), paroxysmal atrial fibrillation, carotid artery disease, HTN, HLD, asthma, anxiety and depression who presents to the office today for 41-month follow-up.  He was examined by Dr. Londa Rival in 07/2023 following a recent ED visit for palpitations and chest discomfort in the setting of recurrent atrial fibrillation. He did spontaneously convert back to normal sinus rhythm after 6 hours and cardiac enzymes were negative. At the time of follow-up, he denied any recurrent symptoms and was continued on his current cardiac medications with Amlodipine  10 mg daily, Toprol -XL 12.5 mg daily, Simvastatin  20 mg daily, Xarelto  20 mg daily and Multaq  400 mg twice daily. He had previously been on Imdur  30 mg daily but reported not taking at that time. He did follow-up with Dr. Carolynne Citron in 09/2023 and options were reviewed in regards to ablation or Tikosyn and he wished to continue his current medical therapy with Multaq .  In talking with the patient and his wife today, he reports having approximately 3 episodes of atrial fibrillation since his last visit. Most recent episode was last night and lasted from 9 PM to around 4 AM. He did not check his heart rate during that timeframe as he was concerned this would cause worsening anxiety. Says that it felt like his prior atrial fibrillation episodes though.  Symptoms spontaneously resolved without intervention. Feels back to baseline today. He denies any recent exertional chest pain. Does have occasional  episodes of pain but has preferred to hold off on starting Imdur . Has baseline dyspnea in the setting of asthma and this is typically worse in the warmer temperatures. No specific orthopnea, PND or pitting edema.  Studies Reviewed:   EKG: EKG is not ordered today.  Event Monitor: 12/2022 NSR with sinus brady (45/min) and sinus tachy (126/min) with ave HR 56/min. Less than 1% atrial fib/flutter with a slow VR (54/min), CVR and RVR (126/min) Rare PVC's and PAC's (less than 1%) No prolonged pauses No VT or SVT   Richard Taylor,MD Patch Wear Time:  12 days and 21 hours (2024-06-11T11:20:27-0400 to 2024-06-24T08:21:03-399)  Coronary CTA: 04/2023 FINDINGS: Non-cardiac: See separate report from [facility]. No significant findings on limited lung and soft tissue windows.   Calcium  Score: 3 vessel calcium  noted   LM 0   LAD 183   RCA 139   LCX 38.9   Total:  361   Coronary Arteries: Right dominant with no anomalies   LM: Normal   LAD: 25-49% calcified proximal disease. 50-69% calcified plaque in mid and distal vessel   D1: 25-49% calcified plaque ostium 50-69% calcified plaque in mid vessel   D2: Small vessel not well seen   Circumflex: 1-24% calcified plaque ostium. 25-49% calcified plaque in mid/distal vessel prior to OM take off   OM1: Normal   OM2: Normal   RCA: 50-69% proximal and distal vessel 25-49% mid vessel   PDA: Normal   PLA: 25-49% calcified plaque   IMPRESSION: 1. Three vessel calcium  noted score 361 which is 90 th percentile for age/sex   2.  Normal  ascending thoracic aorta 3.0 cm   3. CAD RADS 3 possible obstructive CAD in LAD/RCA study sent for FFR  IMPRESSION: FFR CT positive in most distal RCA and LAD  Risk Assessment/Calculations:    CHA2DS2-VASc Score = 2   This indicates a 2.2% annual risk of stroke. The patient's score is based upon: CHF History: 0 HTN History: 1 Diabetes History: 0 Stroke History: 0 Vascular  Disease History: 1 Age Score: 0 Gender Score: 0    Physical Exam:   VS:  BP 110/62 (BP Location: Left Arm, Patient Position: Sitting, Cuff Size: Normal)   Pulse (!) 59   Ht 6' (1.829 m)   Wt 176 lb 3.2 oz (79.9 kg)   SpO2 97%   BMI 23.90 kg/m    Wt Readings from Last 3 Encounters:  11/29/23 176 lb 3.2 oz (79.9 kg)  11/20/23 174 lb 1.9 oz (79 kg)  10/08/23 175 lb (79.4 kg)     GEN: Well nourished, well developed male appearing in no acute distress NECK: No JVD; No carotid bruits CARDIAC: RRR, no murmurs, rubs, gallops RESPIRATORY:  Clear to auscultation without rales, wheezing or rhonchi  ABDOMEN: Appears non-distended. No obvious abdominal masses. EXTREMITIES: No clubbing or cyanosis. No pitting edema.  Distal pedal pulses are 2+ bilaterally.   Assessment and Plan:   1. CAD - He had moderate CAD by cardiac catheterization in 2017 and recent Coronary CTA last year showed scattered stenoses as discussed above but FFR was only positive in the distal RCA and distal LAD and medical management was recommended at that time. - He reports having occasional, brief episodes of chest pain but symptoms have overall improved over the past several months. He prefers to avoid using Imdur  unless absolutely necessary but still has this at home if needed. He is not on ASA given the need for anticoagulation. Continue Toprol -XL 12.5 mg daily and Simvastatin  20 mg daily.  2. Paroxysmal Atrial Fibrillation - He is followed by Dr. Carolynne Citron and has remained on Multaq  400 mg twice daily for antiarrhythmic therapy along with Toprol -XL 12.5 mg daily. TSH and LFT's were WNL when checked in 09/2023. He does report still having occasional, breakthrough episodes but overall feels that symptoms have improved as compared to last year. Prefers to continue current medical therapy at this time. He will also remain on Toprol -XL 12.5 mg daily and we reviewed that he could take an extra 12.5 mg if needed for breakthrough  palpitations. He is concerned about his electrolytes given his recurrent episodes of atrial fibrillation, therefore will recheck a BMET and Mg today.  - On Xarelto  20 mg daily for anticoagulation which is the appropriate dose given his calculated CrCl of 97 mL/min. Hgb was at 16.0 and platelets at 185 K when checked in 09/2023.  3. HTN - BP is well-controlled at 110/62 during today's visit. Continue current medical therapy with Amlodipine  10 mg daily and Toprol -XL 12.5 mg daily.  4. HLD - LDL was at 60 in 09/2023. Continue current medical therapy with Simvastatin  20 mg daily.  Would not further titrate over time given the concurrent use of Multaq  and Amlodipine .  5. Carotid Artery Stenosis - Dopplers in 2022 showed 1-49% stenosis but no significant stenosis by CTA Neck in 2023. Continue Simvastatin  20mg  daily.   Signed, Dorma Gash, PA-C

## 2023-11-30 NOTE — Telephone Encounter (Signed)
 Sent a message via mychart since pt has been active recently since not returning my calls.

## 2023-12-03 ENCOUNTER — Ambulatory Visit (HOSPITAL_COMMUNITY): Admitting: Clinical

## 2023-12-03 DIAGNOSIS — F419 Anxiety disorder, unspecified: Secondary | ICD-10-CM

## 2023-12-03 DIAGNOSIS — F410 Panic disorder [episodic paroxysmal anxiety] without agoraphobia: Secondary | ICD-10-CM | POA: Diagnosis not present

## 2023-12-03 DIAGNOSIS — F32A Depression, unspecified: Secondary | ICD-10-CM

## 2023-12-03 DIAGNOSIS — F331 Major depressive disorder, recurrent, moderate: Secondary | ICD-10-CM

## 2023-12-03 DIAGNOSIS — F4310 Post-traumatic stress disorder, unspecified: Secondary | ICD-10-CM | POA: Diagnosis not present

## 2023-12-03 NOTE — Progress Notes (Signed)
 Virtual Visit via Video Note   I connected with Richard [name] on 12/03/23 at  1:00 PM EDT by a video enabled telemedicine application and verified that I am speaking with the correct person using two identifiers.   Location: Patient: home Provider: office   I discussed the limitations of evaluation and management by telemedicine and the availability of in person appointments. The patient expressed understanding and agreed to proceed.     THERAPIST PROGRESS NOTE     Session Time: 1:00 PM-1:55 PM   Participation Level: Active   Behavioral Response: Casual and Alert,Anxious   Type of Therapy: Individual Therapy   Treatment Goals addressed: Mood and Anxiety   Interventions: CBT   Summary: Richard [name] is a 58 y.o. male who presents with panic disorder/depression with anxiety/and PTSD . The OPT therapist worked with the patient for his OPT treatment. The OPT therapist utilized Motivational Interviewing to assist in creating therapeutic repore. The patient in the session was engaged and work in collaboration giving feedback about his triggers and symptoms over the past few weeks.The patient spoke about difficulty with pulling a muscle in his leg while trying to work on home repairs and this setting him back on progressing on home projects.The OPT therapist utilized Cognitive Behavioral Therapy through cognitive restructuring as well as worked with the patient on coping strategies to assist in management of symptoms as well as reviewed sleep, eating habits, and general health. The patient continues to work on acceptance of limited mobility based on health conditions. The patient spoke about the pulled muscle in his leg effecting his sleep, but getting better over the past few days. The OPT therapist overviewed upcoming health appointments as listed in the patients MyChart.   Suicidal/Homicidal: Nowithout intent/plan   Therapist Response:The OPT therapist worked with the patient for the  patients scheduled session. The patient was engaged in his session and gave feedback in relation to triggers, symptoms, and behavior responses over the past few weeks.The patient spoke about his wife having a fall and breaking her shoulder. The patient spoke about during the Spring working on in home repairs. The OPT therapist worked with the patient utilizing an in session Cognitive Behavioral Therapy exercise. The patient was responsive in the session and verbalized, " I am not sure how but I pulled a muscle in my leg, thankful my wife is doing a little better and so the work load is easier ".The OPT therapist worked with the patient providing ongoing psychotherapy/education and coping skills review. The patient overviewed his physical health and plans to speak with his PCP in the upcoming July appointment for further treatment for existing health conditions including nerve pain at top of spine/back of neck. The patient spoke about utilizing his support network of some of the neighbors who live near him.The OPT therapist provided psycho-education throughout the session. The OPT therapist will continue treatment work with the patient in his next scheduled session   Plan: Return again in 2/3 weeks.   Diagnosis:      Axis I: PTSD/Panic Disorder/ Depression with anxiety  Axis II: No diagnosis      Collaboration of Care: Overview of the patients involvement in the Med Management program with Dr. Avanell Bob.   Patient/Guardian was advised Release of Information must be obtained prior to any record release in order to collaborate their care with an outside provider. Patient/Guardian was advised if they have not already done so to contact the registration department to sign all necessary forms  in order for us  to release information regarding their care.    Consent: Patient/Guardian gives verbal consent for treatment and assignment of benefits for services provided during this visit. Patient/Guardian expressed  understanding and agreed to proceed.    I discussed the assessment and treatment plan with the patient. The patient was provided an opportunity to ask questions and all were answered. The patient agreed with the plan and demonstrated an understanding of the instructions.   The patient was advised to call back or seek an in-person evaluation if the symptoms worsen or if the condition fails to improve as anticipated.   I provided 55 minutes of non-face-to-face time during this encounter.   Lea Primmer, LCSW    12/03/2023

## 2023-12-10 ENCOUNTER — Encounter: Payer: Self-pay | Admitting: Family Medicine

## 2023-12-10 DIAGNOSIS — R058 Other specified cough: Secondary | ICD-10-CM | POA: Insufficient documentation

## 2023-12-10 NOTE — Assessment & Plan Note (Signed)
 Not suicidal or homicidal but challenged by debility as far as physical ability and  health are concerned, PCP to foillow up when at next visit

## 2023-12-10 NOTE — Assessment & Plan Note (Signed)
 CXR and clindamycin  prescribed

## 2023-12-10 NOTE — Progress Notes (Signed)
   Richard [name]     MRN: [id number]      DOB: 1966/02/18  Chief Complaint  Patient presents with   Cough    Complains of productive cough and congestion x3 weeks. Also mild SOB with exertion. States sputum is thick and sour tasting, color unknown. Denies fever.     HPI Richard [name] is here fwith above complaint  ROS Denies PND , orthopnea  palpitations and leg swelling Denies abdominal pain, nausea, vomiting,diarrhea or constipation.   Denies dysuria, frequency, hesitancy or incontinence. Denies joint pain, swelling and limitation in mobility. Denies headaches, seizures, numbness, or tingling.   PE  BP 121/74   Pulse 63   Resp 18   Ht 6' (1.829 m)   Wt 174 lb 1.9 oz (79 kg)   SpO2 95%   BMI 23.61 kg/m   Patient alert and oriented and in no cardiopulmonary distress.  HEENT: No facial asymmetry, EOMI,     Neck supple .No sinus tenderness, no cervical  adenopathy   CHEST:decreased air entry, bi basilar crackles and wheeezes throughout CVS: S1, S2 no murmurs, no S3.Regular rate.  ABD: Soft non tender.   Ext: No edema  MS: Adequate ROM spine, shoulders, hips and knees.   Psych: Good eye contact, normal affect. Memory intact mildly  anxious and  depressed appearing.  CNS: CN 2-12 intact, power,  normal throughout.no focal deficits noted.   Assessment & Plan  Cough productive of purulent sputum CXR and clindamycin  prescribed  COPD (chronic obstructive pulmonary disease) (HCC) Worsening due to ongoing nicotine , cessation counseling done  Essential hypertension Controlled, no change in medication   Cigarette smoker Asked:confirms currently smokes cigarettes 10/day Assess: Unwilling to set a quit date, but does want to start cutting back Advise: needs to QUIT to reduce risk of cancer, cardio and cerebrovascular disease Assist: counseled for 5 minutes and literature provided Arrange: follow up in 2 to 4 months   Anxiety and depression Not suicidal or  homicidal but challenged by debility as far as physical ability and  health are concerned, PCP to foillow up when at next visit

## 2023-12-10 NOTE — Assessment & Plan Note (Signed)
 Asked:confirms currently smokes cigarettes 10/day Assess: Unwilling to set a quit date, but does want to start cutting back Advise: needs to QUIT to reduce risk of cancer, cardio and cerebrovascular disease Assist: counseled for 5 minutes and literature provided Arrange: follow up in 2 to 4 months

## 2023-12-10 NOTE — Assessment & Plan Note (Signed)
 Controlled, no change in medication

## 2023-12-10 NOTE — Assessment & Plan Note (Signed)
Worsening due to ongoing nicotine, cessation counseling done 

## 2023-12-18 ENCOUNTER — Telehealth (INDEPENDENT_AMBULATORY_CARE_PROVIDER_SITE_OTHER): Payer: Self-pay

## 2023-12-18 NOTE — Telephone Encounter (Signed)
 Wife called and stated they have some questions before their appointment tomorrow.

## 2023-12-18 NOTE — Telephone Encounter (Signed)
 Spoke with patient's spouse. She explained that patient has anxiety and cannot drive long distances. She asked for me to cancel his appointment.

## 2023-12-19 ENCOUNTER — Institutional Professional Consult (permissible substitution) (INDEPENDENT_AMBULATORY_CARE_PROVIDER_SITE_OTHER): Admitting: Otolaryngology

## 2023-12-19 DIAGNOSIS — J310 Chronic rhinitis: Secondary | ICD-10-CM | POA: Diagnosis not present

## 2023-12-20 ENCOUNTER — Institutional Professional Consult (permissible substitution) (INDEPENDENT_AMBULATORY_CARE_PROVIDER_SITE_OTHER): Admitting: Otolaryngology

## 2023-12-21 ENCOUNTER — Ambulatory Visit (HOSPITAL_COMMUNITY)
Admission: RE | Admit: 2023-12-21 | Discharge: 2023-12-21 | Disposition: A | Discharge status: Home / Self Care | Source: Ambulatory Visit | Attending: Acute Care | Admitting: Acute Care

## 2023-12-21 DIAGNOSIS — I2510 Atherosclerotic heart disease of native coronary artery without angina pectoris: Secondary | ICD-10-CM | POA: Insufficient documentation

## 2023-12-21 DIAGNOSIS — R918 Other nonspecific abnormal finding of lung field: Secondary | ICD-10-CM | POA: Diagnosis not present

## 2023-12-21 DIAGNOSIS — Z122 Encounter for screening for malignant neoplasm of respiratory organs: Secondary | ICD-10-CM | POA: Insufficient documentation

## 2023-12-21 DIAGNOSIS — Z87891 Personal history of nicotine dependence: Secondary | ICD-10-CM | POA: Insufficient documentation

## 2023-12-21 DIAGNOSIS — J439 Emphysema, unspecified: Secondary | ICD-10-CM | POA: Diagnosis not present

## 2023-12-21 DIAGNOSIS — F17210 Nicotine dependence, cigarettes, uncomplicated: Secondary | ICD-10-CM | POA: Insufficient documentation

## 2023-12-21 DIAGNOSIS — I700 Atherosclerosis of aorta: Secondary | ICD-10-CM | POA: Insufficient documentation

## 2023-12-27 DIAGNOSIS — Z419 Encounter for procedure for purposes other than remedying health state, unspecified: Secondary | ICD-10-CM | POA: Diagnosis not present

## 2023-12-31 ENCOUNTER — Ambulatory Visit (INDEPENDENT_AMBULATORY_CARE_PROVIDER_SITE_OTHER): Admitting: Clinical

## 2023-12-31 ENCOUNTER — Telehealth: Payer: Self-pay

## 2023-12-31 DIAGNOSIS — F4310 Post-traumatic stress disorder, unspecified: Secondary | ICD-10-CM | POA: Diagnosis not present

## 2023-12-31 DIAGNOSIS — F331 Major depressive disorder, recurrent, moderate: Secondary | ICD-10-CM | POA: Diagnosis not present

## 2023-12-31 DIAGNOSIS — F419 Anxiety disorder, unspecified: Secondary | ICD-10-CM

## 2023-12-31 NOTE — Progress Notes (Signed)
 Virtual Visit via Video Note   I connected with Richard [name] on 12/31/23 at  2:00 PM EDT by a video enabled telemedicine application and verified that I am speaking with the correct person using two identifiers.   Location: Patient: home Provider: office   I discussed the limitations of evaluation and management by telemedicine and the availability of in person appointments. The patient expressed understanding and agreed to proceed.     THERAPIST PROGRESS NOTE     Session Time: 2:00 PM-2:55 PM   Participation Level: Active   Behavioral Response: Casual and Alert,Anxious   Type of Therapy: Individual Therapy   Treatment Goals addressed: Mood and Anxiety   Interventions: CBT   Summary: Richard [name] is a 58 y.o. male who presents with panic disorder/depression with anxiety/and PTSD . The OPT therapist worked with the patient for his OPT treatment. The OPT therapist utilized Motivational Interviewing to assist in creating therapeutic repore. The patient in the session was engaged and work in collaboration giving feedback about his triggers and symptoms over the past few weeks. The patient spoke about his recent birthday celebrating turning 27.The patient spoke about working on home repairs and noted he has with the help of his son made progress on home projects.The patient spoke about having a good Fathers Day with family coming to visit. The OPT therapist utilized Cognitive Behavioral Therapy through cognitive restructuring as well as worked with the patient on coping strategies to assist in management of symptoms as well as reviewed sleep, eating habits, and general health. The patient continues to work on acceptance of limited mobility based on health conditions and spoke about the impact of his physical health on his functioning. The OPT therapist overviewed upcoming health appointments listed in the patients MyChart.   Suicidal/Homicidal: Nowithout intent/plan   Therapist  Response:The OPT therapist worked with the patient for the patients scheduled session. The patient was engaged in his session and gave feedback in relation to triggers, symptoms, and behavior responses over the past few weeks.The patient spoke about working on in home repairs getting help from his son and making progress on in home repairs. The OPT therapist worked with the patient utilizing an in session Cognitive Behavioral Therapy exercise. The patient was responsive in the session and verbalized,  I have had some good things and some stressful things with my health.The OPT therapist worked with the patient providing ongoing psychotherapy/education and coping skills review. The patient overviewed his physical health and plans to speak with his PCP in the upcoming July appointment for further treatment for existing health conditions including nerve pain at top of spine/back of neck, pain in mouth, ongoing treatment of Alpha-Gal. The patient spoke about utilizing his support network of some of the neighbors who live near him.The OPT therapist provided psycho-education throughout the session. The OPT therapist will continue treatment work with the patient in his next scheduled session   Plan: Return again in 2/3 weeks.   Diagnosis:      Axis I: PTSD/Panic Disorder/ Depression with anxiety  Axis II: No diagnosis      Collaboration of Care: Overview of the patients involvement in the Med Management program with Dr. Avanell Bob.   Patient/Guardian was advised Release of Information must be obtained prior to any record release in order to collaborate their care with an outside provider. Patient/Guardian was advised if they have not already done so to contact the registration department to sign all necessary forms in order for us  to  release information regarding their care.    Consent: Patient/Guardian gives verbal consent for treatment and assignment of benefits for services provided during this visit.  Patient/Guardian expressed understanding and agreed to proceed.    I discussed the assessment and treatment plan with the patient. The patient was provided an opportunity to ask questions and all were answered. The patient agreed with the plan and demonstrated an understanding of the instructions.   The patient was advised to call back or seek an in-person evaluation if the symptoms worsen or if the condition fails to improve as anticipated.   I provided 55 minutes of non-face-to-face time during this encounter.   Lea Primmer, LCSW    12/31/2023

## 2023-12-31 NOTE — Telephone Encounter (Signed)
 Copied from CRM [id number]. Topic: Clinical - Lab/Test Results >> Dec 31, 2023 10:18 AM Ambrose Junk wrote: Reason for CRM:  Patient had CT on 6/06 and results are not in MyChart. Please call patient to discuss.  Wife [phone]  Please advise CT results when final. Thank you.

## 2024-01-01 ENCOUNTER — Ambulatory Visit: Payer: Self-pay

## 2024-01-01 VITALS — BP 128/80 | HR 63 | Ht 72.0 in | Wt 174.0 lb

## 2024-01-01 DIAGNOSIS — K1370 Unspecified lesions of oral mucosa: Secondary | ICD-10-CM | POA: Diagnosis not present

## 2024-01-01 DIAGNOSIS — L989 Disorder of the skin and subcutaneous tissue, unspecified: Secondary | ICD-10-CM

## 2024-01-01 MED ORDER — TRIAMCINOLONE ACETONIDE 0.1 % MT PSTE: 1.0000 | PASTE | Freq: Two times a day (BID) | OROMUCOSAL | 0 refills | Status: DC

## 2024-01-01 NOTE — Progress Notes (Signed)
 Established Patient Office Visit  Subjective   Patient ID: Richard [name], male    DOB: 08-07-1965  Age: 58 y.o. MRN: [id number]  Chief Complaint  Patient presents with   Medical Management of Chronic Issues    Pt states Sore in his mouth inside of right cheek, drained but it came back its not getting any better or worse, Pt also states spot on his back    HPI Rash: Patient complains of rash involving the back & inner Rt cheek. Rash started 6 months ago. Appearance of rash at onset: Color of lesion(s): pink to white, Texture of lesion(s): flat. Rash has changed color over time Initial distribution: back & inner right cheek  Discomfort associated with rash: is pruritic (back).  Associated symptoms: none. Denies: none. Patient has not had previous evaluation of rash. Patient has not had previous treatment.  Response to treatment: N/A. Patient has not had contacts with similar rash. Patient has not identified precipitant. Patient has not had new exposures (soaps, lotions, laundry detergents, foods, medications, plants, insects or animals.)  Patient Active Problem List   Diagnosis Date Noted   Cough productive of purulent sputum 12/10/2023   Leg cramps 08/14/2023   Encounter for examination following treatment at hospital 08/14/2023   UTI (urinary tract infection) 07/03/2023   Prediabetes 06/19/2023   Dermatitis 06/19/2023   Fatigue 02/16/2023   Delayed wound healing 12/05/2022   Prostate cancer screening 11/10/2022   Arm pain, left 08/04/2022   Upper airway cough syndrome vs cough variant asthma 03/14/2022   Abnormal chest CT 11/16/2021   Pulmonary nodules 11/16/2021   RLQ abdominal pain 08/14/2021   Abnormal weight loss 08/14/2021   Acute ischemic stroke (HCC) 08/03/2021   Hypokalemia 08/03/2021   Hyperglycemia due to diabetes mellitus (HCC) 08/03/2021   Atrial fibrillation, chronic (HCC) 08/03/2021   Secondary hypercoagulable state (HCC) 04/27/2021   Paroxysmal atrial  fibrillation (HCC)    Lateral epicondylitis, left elbow 03/27/2019   Globus sensation 02/21/2018   Other cervical disc degeneration, unspecified cervical region 01/11/2018   Tendinitis of right triceps 01/11/2018   Neck pain 12/27/2017   Lateral epicondylitis, right elbow 05/10/2017   Constipation 12/24/2015   DOE (dyspnea on exertion) 11/17/2015   Coronary artery disease    Heart palpitations 08/12/2015   TIA (transient ischemic attack) 08/12/2015   Colon cancer screening 08/02/2015   Abdominal pain 12/18/2014   Encounter for screening colonoscopy 12/18/2014   Vitamin D  deficiency 08/20/2012   Arteriosclerotic cardiovascular disease (ASCVD) 04/11/2012   Dyspepsia 11/29/2011   Chronic low back pain    Dysphagia 05/09/2011   Essential hypertension    Anxiety and depression    Controlled diabetes mellitus type 2 with complications (HCC) 10/03/2010   Cigarette smoker 09/14/2010   COPD (chronic obstructive pulmonary disease) (HCC) 09/14/2010   Mixed hyperlipidemia 11/25/2009   GERD (gastroesophageal reflux disease) 04/05/2009   Hepatic steatosis 04/05/2009      ROS       Objective:     BP 128/80   Pulse 63   Ht 6' (1.829 m)   Wt 174 lb (78.9 kg)   SpO2 98%   BMI 23.60 kg/m  BP Readings from Last 3 Encounters:  01/01/24 128/80  11/29/23 110/62  11/20/23 121/74   Wt Readings from Last 3 Encounters:  01/01/24 174 lb (78.9 kg)  11/29/23 176 lb 3.2 oz (79.9 kg)  11/20/23 174 lb 1.9 oz (79 kg)      Physical Exam Vitals and nursing note  reviewed.  Constitutional:      Appearance: Normal appearance.  HENT:     Head: Normocephalic.     Salivary Glands: Right salivary gland is not diffusely enlarged. Left salivary gland is not diffusely enlarged.     Right Ear: Tympanic membrane and ear canal normal.     Left Ear: Tympanic membrane and ear canal normal.     Nose: Nose normal.     Mouth/Throat:     Mouth: Mucous membranes are moist. Oral lesions (right inner  cheek, small whitish lesion in right inner cheek) present.     Dentition: Abnormal dentition (missing upper teeth). Has dentures (bottom dentures, in process of getting upper dentures).     Tongue: No lesions.     Palate: No mass.     Pharynx: Oropharynx is clear. Uvula midline.   Eyes:     Extraocular Movements: Extraocular movements intact.     Pupils: Pupils are equal, round, and reactive to light.    Cardiovascular:     Rate and Rhythm: Normal rate and regular rhythm.  Pulmonary:     Effort: Pulmonary effort is normal.     Breath sounds: Normal breath sounds.   Musculoskeletal:     Cervical back: Normal range of motion and neck supple.   Neurological:     Mental Status: He is alert and oriented to person, place, and time.   Psychiatric:        Mood and Affect: Mood normal.        Thought Content: Thought content normal.     No results found for any visits on 01/01/24.  Last CBC Lab Results  Component Value Date   WBC 6.4 09/20/2023   HGB 16.0 09/20/2023   HCT 46.2 09/20/2023   MCV 94 09/20/2023   MCH 32.4 09/20/2023   RDW 12.4 09/20/2023   PLT 185 09/20/2023   Last metabolic panel Lab Results  Component Value Date   GLUCOSE 112 (H) 11/29/2023   NA 136 11/29/2023   K 4.0 11/29/2023   CL 104 11/29/2023   CO2 24 11/29/2023   BUN 14 11/29/2023   CREATININE 0.90 11/29/2023   GFRNONAA >60 11/29/2023   CALCIUM  8.9 11/29/2023   PHOS 4.2 08/04/2021   PROT 6.8 09/20/2023   ALBUMIN 4.6 09/20/2023   LABGLOB 2.2 09/20/2023   AGRATIO 2.3 (H) 10/20/2022   BILITOT 0.4 09/20/2023   ALKPHOS 60 09/20/2023   AST 12 09/20/2023   ALT 16 09/20/2023   ANIONGAP 8 11/29/2023      The ASCVD Risk score (Arnett DK, et al., 2019) failed to calculate for the following reasons:   Risk score cannot be calculated because patient has a medical history suggesting prior/existing ASCVD    Assessment & Plan:   Problem List Items Addressed This Visit   None Visit Diagnoses        Oral lesion    -  Primary   Treat with topical Kenalog  paste.  Recommend f/u with dentist or ENT if no improvement. Picture of lesion in pt chart.   Relevant Medications   triamcinolone  (KENALOG ) 0.1 % paste     Skin lesion of back  (Chronic)      Will refer to dermatology for further evaluation given duration of lesion and reported changes in appearance.  Picture of lesion attached to note.   Relevant Orders   Ambulatory referral to Dermatology       No follow-ups on file.    Alison Irvine, FNP

## 2024-01-04 ENCOUNTER — Other Ambulatory Visit: Payer: Self-pay | Admitting: Acute Care

## 2024-01-04 ENCOUNTER — Telehealth: Payer: Self-pay | Admitting: Acute Care

## 2024-01-04 DIAGNOSIS — R911 Solitary pulmonary nodule: Secondary | ICD-10-CM

## 2024-01-04 NOTE — Telephone Encounter (Signed)
 See provider note 01/04/2024

## 2024-01-04 NOTE — Telephone Encounter (Signed)
 Call report from [facility]:   IMPRESSION: 1. Lung-RADS 4B, suspicious. Additional imaging evaluation or consultation with Pulmonology or Thoracic Surgery recommended. New irregular solid 13.1 mm and 6.6 mm anterior peripheral left lower lobe pulmonary nodules. Suggest further evaluation with PET-CT at this time. Alternatively, follow up low-dose chest CT without contrast could be obtained in 3 months (please use the following order, CT CHEST LCS NODULE FOLLOW-UP W/O CM). 2. Three-vessel coronary atherosclerosis. 3. Aortic Atherosclerosis (ICD10-I70.0) and Emphysema (ICD10-J43.9).

## 2024-01-04 NOTE — Telephone Encounter (Signed)
 Results/ plans faxed to PCP. Placed on reminder list for PET and follow up.

## 2024-01-04 NOTE — Telephone Encounter (Signed)
 I have called the patient with the results of his low dose Ct Chest. He has had a new nodule develop since his last screening scan 12 months ago.  There is a new irregular solid left lower lobe 13.1 pulmonary nodule, as well as a 6.6 mm left lower lobe pulmonary nodule noted on the scan neither of which were there 12 months ago.  Patient states he has not been sick.  No fever no discolored secretions.  They did have a branch fell through the roof of their house and he does feel he has been exposed to some mold.  He does have a new cough.  Plan will be for a PET scan to further evaluate this finding. Please fax results to PCP and let her know plan for follow-up.  Please make sure patient is scheduled with me 1 to 2 weeks after the PET scan to further evaluate, review results, and determine next best step in plan of care.  Thank you

## 2024-01-08 ENCOUNTER — Ambulatory Visit (INDEPENDENT_AMBULATORY_CARE_PROVIDER_SITE_OTHER)

## 2024-01-08 DIAGNOSIS — E118 Type 2 diabetes mellitus with unspecified complications: Secondary | ICD-10-CM | POA: Diagnosis not present

## 2024-01-08 DIAGNOSIS — R7303 Prediabetes: Secondary | ICD-10-CM

## 2024-01-08 LAB — HM DIABETES EYE EXAM

## 2024-01-08 NOTE — Progress Notes (Signed)
 Richard [name] arrived 01/08/2024 and has given verbal consent to obtain images and complete their overdue diabetic retinal screening.  The images have been sent to an ophthalmologist or optometrist for review and interpretation.  Results will be sent back to Zarwolo, Gloria, FNP for review.  Patient has been informed they will be contacted when we receive the results via telephone or MyChart

## 2024-01-13 NOTE — Progress Notes (Unsigned)
 Subjective:    Patient ID: Richard [name], male    DOB: 05/08/1966,    MRN: [id number]  HPI  48 yowm active smoker with onset of doe around 2011 much worse x 2015-16 to point where has trouble to walking  x 197ft slt uphill to MB  so referred to pulmonary clinic 11/17/2015 by Dr Catharine.   11/17/2015 1st [facility] office visit/ [name]  On advair 250 bid  Chief Complaint  Patient presents with   pulmonary consult    pt ref by dr. catharine for SOB. dry cough occ prod, wheezing mainly @ night, occ cp.   indolent onset doe x 6 years worse with certain smells and some better on advair/ventolin  as long as avoids exertion but  wheezing every night x one year disturbs sleep On nexium  bid but not ac Rec Plan A = Automatic = Symbicort  80 Take 2 puffs first thing in am and then another 2 puffs about 12 hours later.  Plan B = Backup Only use your albuterol (ventolin )  as a rescue medication  Stop lisinopril  and start valsartan  80 mg one daily in its place   nexium  Take 30- 60 min before your first and last meals of the day  GERD diet  Please schedule a follow up office visit in 4 weeks, sooner if needed > did not return as rec   03/14/2022  Re-establish ov/[place] office/[place] Dost re: AB maint on saba prn   Chief Complaint  Patient presents with   Consult    Consult for pulm emphysema. Lung pain and SOB   Dyspnea:  several aisles at [employer] variable speeds then gives out  Cough: varies during the day, not in AM/ just mucoid / powdered inhalers make it worse  Sleeping: bed has 2.5 inches or bad gerd  SABA use: up to 3 x days , never prechallenges 02: checks levels 20 min p arrives  Covid status: no vaccines, never infected  Lung cancer screening: had ct 09/2021 Rec Nexium  40 mg Take 30- 60 min before your first and last meals of the day  GERD diet reviewed, bed blocks rec  Breztri  one puff every 12 hours - if helps can try dulera  or symbiocrt thru patient assistance Only use your albuterol  as a  rescue medication  Ok to try albuterol  15 min before an activity (on alternating days)  that you know would usually make you short of breath     04/25/2022  f/u ov/ office/[name] re: doe  maint on nothing  / breztri  made him cough Chief Complaint  Patient presents with   Follow-up    Breathing has not changed since last ov   Dyspnea:  [employer] walking /5 squats / able to cross a half parking   Cough: clear phlegy Sleeping: slt elevation  SABA use: none on day of, up to twice daily as needed but does not pre-challenge or rechallenge as rec  02: none  Covid status: never 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  x 5 years > filed for disability  Rec Plan A = Automatic = Always=    Stiolto 2 puffs each am daily  Work on inhaler technique:   Plan B = Backup (to supplement plan A, not to replace it) Only use your albuterol  inhaler as a rescue medication Ok to try albuterol  15 min before an activity (on alternating days)  that you know would usually make you short of breath   Get rid of the mold and cigarettes  07/04/2023  f/u ov/[place] office/[name] re: AB maint on symbicort  80  3-4 x per week in am / not typically in pm / and still smoking  Chief Complaint  Patient presents with   Shortness of Breath  Dyspnea:  can walk entire [employer] nl pace   Cough: variably  thick mostly white not using mucinex  Sleeping: bed blocks s resp cc s  am cough until stands  SABA use: not as much / did not bring it Rec For cough use mucinex dm 1200 mg every 12 hours as needed  Symbicort  80 up to 2 puffs every 12 hours as needed  Only use your albuterol  as a rescue medication   Also  Ok to try albuterol  15 min before an activity (on alternating days)  that you know would usually make you short of breath     PFT's  09/13/23 WNL    CT sinus  09/25/23   No air fluid levels  10/08/2023  f/u ov/[place] office/[name] re: AB  maint on symbicort  80 2bid   Dyspnea:  no change ex tol  Cough: dark and thick  turned dark since last ov assoc with nasal congestion x sev months  Sleeping: bed blocks s  resp cc if smokes 1/2 cig prior to bed / doesn't cough until still  SABA use: occ  02: none  LCS : in program  Rec nasal steroids (flonase )  have no immediate benefit in terms of improving symptoms.     GERD diet reviewed, bed blocks rec  Please schedule a follow up visit in 3 months but call sooner if needed  with all medications /inhalers/ solutions in hand  - ENT consult  12/19/23 dx  chronic rhinitis Trial of Atrovent nasal spray 2 sprays per nostril 3 times daily-can increase to 4 times daily if needed or decrease to twice daily if too dry Will plan to recheck in 6 to 8 week    01/14/2024  f/u ov/[place] office/[name] re: AB maint on symbicort  80  did not  bring meds x for saba  Chief Complaint  Patient presents with   Follow-up   Cough  Dyspnea:  [employer] walking ok  Cough: more at home thick mucus x sev months  Sleeping: bed blocks resp cc ok congested in   4 am  SABA use: one a day around  02: none      No obvious day to day or daytime variability or assoc  purulent sputum or mucus plugs or hemoptysis or cp or chest tightness, subjective wheeze or overt hb symptoms.    Also denies any obvious fluctuation of symptoms with weather or environmental changes or other aggravating or alleviating factors except as outlined above   No unusual exposure hx or h/o childhood pna/ asthma or knowledge of premature birth.  Current Allergies, Complete Past Medical History, Past Surgical History, Family History, and Social History were reviewed in [employer] record.  ROS  The following are not active complaints unless bolded Hoarseness, sore throat, dysphagia, dental problems, itching, sneezing,  nasal congestion or discharge of excess mucus or purulent secretions, ear ache,   fever, chills, sweats, unintended wt loss or wt gain, classically pleuritic or exertional cp,   orthopnea pnd or arm/hand swelling  or leg swelling, presyncope, palpitations, abdominal pain, anorexia, nausea, vomiting, diarrhea  or change in bowel habits or change in bladder habits, change in stools or change in urine, dysuria, hematuria,  rash, arthralgias, visual complaints, headache, numbness, weakness or ataxia or  problems with walking or coordination,  change in mood or  memory.        Current Meds  Medication Sig   acetaminophen  (TYLENOL ) 325 MG tablet Take 650 mg by mouth every 6 (six) hours as needed for moderate pain.   albuterol  (VENTOLIN  HFA) 108 (90 Base) MCG/ACT inhaler INHALE 2 PUFFS BY MOUTH EVERY 6 HOURS AS NEEDED FOR COUGHING, WHEEZING, OR SHORTNESS OF BREATH   ALPRAZolam  (XANAX ) 0.5 MG tablet Take one twice a day and two at bedtime   amLODipine  (NORVASC ) 10 MG tablet Take 1 tablet by mouth once daily   azelastine  (ASTELIN ) 0.1 % nasal spray Place 2 sprays into both nostrils 2 (two) times daily as needed. Use in each nostril as directed   budesonide -formoterol  (SYMBICORT ) 80-4.5 MCG/ACT inhaler Take 2 puffs first thing in am and then another 2 puffs about 12 hours later.   dronedarone  (MULTAQ ) 400 MG tablet Take 1 tablet (400 mg total) by mouth 2 (two) times daily with a meal.   EPINEPHrine  0.3 mg/0.3 mL IJ SOAJ injection Inject 0.3 mg into the muscle as needed for anaphylaxis.   esomeprazole  (NEXIUM ) 40 MG capsule Take 1 capsule (40 mg total) by mouth 2 (two) times daily before a meal.   fluticasone  (FLONASE ) 50 MCG/ACT nasal spray Place 2 sprays into both nostrils daily.   isosorbide  mononitrate (IMDUR ) 30 MG 24 hr tablet Take 1 tablet (30 mg total) by mouth daily. (Patient taking differently: Take 1 tablet (30 mg total) by mouth daily.)   KLOR-CON  M20 20 MEQ tablet TAKE 1 TABLET BY MOUTH EVERY DAY   metoprolol  succinate (TOPROL -XL) 25 MG 24 hr tablet Take 0.5 tablets (12.5 mg total) by mouth daily. Can take an extra 12.5mg  as needed for palpitations.   nitroGLYCERIN   (NITROSTAT ) 0.4 MG SL tablet Place 1 tablet (0.4 mg total) under the tongue every 5 (five) minutes x 3 doses as needed for chest pain (if no relief after 3rd dose, proceed to the ED for an evaluation).   rivaroxaban  (XARELTO ) 20 MG TABS tablet TAKE 1 TABLET BY MOUTH ONCE DAILY WITH SUPPER   simvastatin  (ZOCOR ) 20 MG tablet Take 1 tablet by mouth once daily   triamcinolone  (KENALOG ) 0.1 % paste Use as directed 1 Application in the mouth or throat 2 (two) times daily.   triamcinolone  cream (KENALOG ) 0.1 % Apply 1 Application topically 2 (two) times daily.   vitamin B-12 (CYANOCOBALAMIN ) 50 MCG tablet Take 50 mcg by mouth daily.   VITAMIN D  PO Take 1 tablet by mouth daily.                Objective:   Physical Exam   Wts  01/14/2024         173 10/08/2023         175  07/04/2023       172  01/02/2023         170  04/25/2022      173  03/14/2022        175  11/17/15 207 lb 9.6 oz (94.167 kg)  10/14/15 205 lb 6.4 oz (93.169 kg)  10/07/15 204 lb (92.534 kg)    Vital signs reviewed  01/14/2024  - Note at rest 02 sats  98% on RA   General appearance:    amb wm nad    no upper teeth     HEENT : Oropharynx  clear/ no upper teeth  Nasal turbinates nl    NECK :  without  apparent JVD/ palpable Nodes/TM    LUNGS: no acc muscle use,  Min barrel  contour chest wall with bilateral  slightly decreased bs s audible wheeze and  without cough on insp or exp maneuvers and min  Hyperresonant  to  percussion bilaterally    CV:  RRR  no s3 or murmur or increase in P2, and no edema   ABD:  soft and nontender with pos end  insp Hoover's  in the supine position.  No bruits or organomegaly appreciated   MS:  Nl gait/ ext warm without deformities Or obvious joint restrictions  calf tenderness, cyanosis or clubbing     SKIN: warm and dry without lesions    NEURO:  alert, approp, nl sensorium with  no motor or cerebellar deficits apparent.           Assessment & Plan:

## 2024-01-14 ENCOUNTER — Ambulatory Visit (INDEPENDENT_AMBULATORY_CARE_PROVIDER_SITE_OTHER): Admitting: Internal Medicine

## 2024-01-14 ENCOUNTER — Encounter: Payer: Self-pay | Admitting: Internal Medicine

## 2024-01-14 VITALS — BP 123/72 | HR 64 | Ht 72.0 in | Wt 173.0 lb

## 2024-01-14 DIAGNOSIS — R0609 Other forms of dyspnea: Secondary | ICD-10-CM | POA: Diagnosis not present

## 2024-01-14 DIAGNOSIS — R058 Other specified cough: Secondary | ICD-10-CM

## 2024-01-14 DIAGNOSIS — F17210 Nicotine dependence, cigarettes, uncomplicated: Secondary | ICD-10-CM | POA: Diagnosis not present

## 2024-01-14 MED ORDER — BUDESONIDE-FORMOTEROL FUMARATE 160-4.5 MCG/ACT IN AERO: INHALATION_SPRAY | RESPIRATORY_TRACT | 12 refills | Status: AC

## 2024-01-14 NOTE — Assessment & Plan Note (Addendum)
 Active smoker Spirometry 05/05/15   FEV1 3.47 (85%)  Ratio 74  - 11/17/2015   try symb 80 2bid and stop advair  - 03/14/2022   Walked on RA  x  3  lap(s) =  approx 450  ft  @ fast pace, stopped due to end of study with lowest 02 sats 97% min sob   - 03/14/2022  After extensive coaching inhaler device,  effectiveness =    80% HFA so try off advair dpi  And on symbicort  80 or dulera  100 2bid (breztri  sample given for teaching purposes  - 04/25/2022  try stiolto> caused cough  Chest LDSCT    12/20/22 Mild centrilobular and paraseptal emphysematous changes, upper lung predominant. - 01/02/2023  trial of symbicort  80 2bid prn - PFT's  09/13/23 WNL    FEV1 3.89 (98 % ) ratio 0.75  p 3 % improvement from saba p 0 prior to study with DLCO  29.57 (100%)   and FV curve nl    - 01/14/2024  After extensive coaching inhaler device,  effectiveness =    90% with increased need for saba so try symbicort  160 2bid and more approp saba   Re SABA :  I spent extra time with pt today reviewing appropriate use of albuterol  for prn use on exertion with the following points: 1) saba is for relief of sob that does not improve by walking a slower pace or resting but rather if the pt does not improve after trying this first. 2) If the pt is convinced, as many are, that saba helps recover from activity faster then it's easy to tell if this is the case by re-challenging : ie stop, take the inhaler, then p 5 minutes try the exact same activity (intensity of workload) that just caused the symptoms and see if they are substantially diminished or not after saba 3) if there is an activity that reproducibly causes the symptoms, try the saba 15 min before the activity on alternate days   If in fact the saba really does help, then fine to continue to use it prn but advised may need to look closer at the maintenance regimen being used to achieve better control of airways disease with exertion.        F/u yearly   Each maintenance medication  was reviewed in detail including emphasizing most importantly the difference between maintenance and prns and under what circumstances the prns are to be triggered using an action plan format where appropriate.  Total time for H and P, chart review, counseling, reviewing hfa device(s) and generating customized AVS unique to this office visit / same day charting = 23 min

## 2024-01-14 NOTE — Patient Instructions (Addendum)
 Change symbicort  to 160  Take 2 puffs first thing in am and then another 2 puffs about 12 hours later.     Only use your albuterol  as a rescue medication to be used if you can't catch your breath by resting or doing a relaxed purse lip breathing pattern.  - The less you use it, the better it will work when you need it. - Ok to use up to 2 puffs  every 4 hours if you must but call for immediate appointment if use goes up over your usual need - Don't leave home without it !!  (think of it like the spare tire for your car)    Also  Ok to try albuterol  15 min before an activity (on alternating days)  that you know would usually make you short of breath and see if it makes any difference and if makes none then don't take albuterol  after activity unless you can't catch your breath as this means it's the resting that helps, not the albuterol .     The key is to stop smoking completely before smoking completely stops you!        Please schedule a follow up visit in 6 months but call sooner if needed

## 2024-01-14 NOTE — Assessment & Plan Note (Signed)

## 2024-01-15 ENCOUNTER — Ambulatory Visit
Admission: RE | Admit: 2024-01-15 | Discharge: 2024-01-15 | Disposition: A | Discharge status: Home / Self Care | Source: Ambulatory Visit | Attending: Nurse Practitioner | Admitting: Nurse Practitioner

## 2024-01-15 ENCOUNTER — Telehealth: Payer: Self-pay | Admitting: Emergency Medicine

## 2024-01-15 VITALS — BP 130/73 | HR 58 | Temp 97.9°F | Resp 20

## 2024-01-15 DIAGNOSIS — K0889 Other specified disorders of teeth and supporting structures: Secondary | ICD-10-CM

## 2024-01-15 MED ORDER — LIDOCAINE VISCOUS HCL 2 % MT SOLN
15.0000 mL | OROMUCOSAL | 0 refills | Status: AC | PRN
Start: 2024-01-15 — End: ?

## 2024-01-15 MED ORDER — CLINDAMYCIN PALMITATE HCL 75 MG/5ML PO SOLR: 450.0000 mg | Freq: Three times a day (TID) | ORAL | Status: DC

## 2024-01-15 MED ORDER — CLINDAMYCIN PALMITATE HCL 75 MG/5ML PO SOLR: 450.0000 mg | Freq: Three times a day (TID) | ORAL | 0 refills | Status: AC

## 2024-01-15 MED ORDER — CLINDAMYCIN HCL 150 MG PO CAPS: 450.0000 mg | ORAL_CAPSULE | Freq: Three times a day (TID) | ORAL | 0 refills | Status: DC

## 2024-01-15 NOTE — Telephone Encounter (Signed)
 Patient requested that Clindamycin  rx be changed to liquid.  Rx changed and sent to [facility] pharmacy.

## 2024-01-15 NOTE — Discharge Instructions (Signed)
 We are treating you for a dental infection with Clindamycin  today.  You can continue Tylenol  and ice as needed for pain as well as the topical lidocaine  that has been sent to the pharmacy.  Follow up with a Dentist.

## 2024-01-15 NOTE — ED Triage Notes (Signed)
 Pt reports he has bottom left side teeth pain x 1 day    Doesn't have a dentist

## 2024-01-15 NOTE — ED Provider Notes (Signed)
 [facility]    CSN: [id number] Arrival date & time: 01/15/24  1024      History   Chief Complaint Chief Complaint  Patient presents with   Facial Pain    Toothache in back on right side with pain in ear and glands - Entered by patient    HPI Richard [name] is a 58 y.o. male.   Patient presents today 1 day history of left lower jaw dental pain.  Reports the pain is radiating up to his left ear and he feels like the lymph node is swollen under his left jaw.  No fevers or nausea/vomiting.  Has taken Tylenol  and applied ice for the pain which has helped minimally.  Reports history of dental abscess in the past.  Does not currently have a dentist but wife is trying to get them in with one.    Past Medical History:  Diagnosis Date   Arthritis    Asthma    CAD (coronary artery disease)    a. Cardiac cath 07/2015 showed 65% distal Cx, 20% mid-distal LAD, 20% prox-distal RCA, EF 60%, EDP .   Colitis 1990   COPD (chronic obstructive pulmonary disease) (HCC)    Depression    Dysrhythmia    Essential hypertension    Fatty liver    Gastric ulcer 2003; 2012   2003: + esophagitis; negative H.pylori serology  2012: Dr. Harvey, mild gastritis, Bravo PH probe placement, negative H.pylori   GERD (gastroesophageal reflux disease)    Hepatic steatosis    History of hiatal hernia    Hyperlipemia    Overweight    Panic attacks    Paroxysmal atrial fibrillation (HCC)    Pulmonary nodules    Stroke [facility])    TIA (transient ischemic attack)    Type II diabetes mellitus [facility])     Patient Active Problem List   Diagnosis Date Noted   Cough productive of purulent sputum 12/10/2023   Leg cramps 08/14/2023   Encounter for examination following treatment at hospital 08/14/2023   UTI (urinary tract infection) 07/03/2023   Prediabetes 06/19/2023   Dermatitis 06/19/2023   Fatigue 02/16/2023   Delayed wound healing 12/05/2022   Prostate cancer screening 11/10/2022   Arm pain,  left 08/04/2022   Upper airway cough syndrome vs cough variant asthma 03/14/2022   Abnormal chest CT 11/16/2021   Pulmonary nodules 11/16/2021   RLQ abdominal pain 08/14/2021   Abnormal weight loss 08/14/2021   Acute ischemic stroke (HCC) 08/03/2021   Hypokalemia 08/03/2021   Hyperglycemia due to diabetes mellitus (HCC) 08/03/2021   Atrial fibrillation, chronic (HCC) 08/03/2021   Secondary hypercoagulable state (HCC) 04/27/2021   Paroxysmal atrial fibrillation (HCC)    Lateral epicondylitis, left elbow 03/27/2019   Globus sensation 02/21/2018   Other cervical disc degeneration, unspecified cervical region 01/11/2018   Tendinitis of right triceps 01/11/2018   Neck pain 12/27/2017   Lateral epicondylitis, right elbow 05/10/2017   Constipation 12/24/2015   DOE (dyspnea on exertion) 11/17/2015   Coronary artery disease    Heart palpitations 08/12/2015   TIA (transient ischemic attack) 08/12/2015   Colon cancer screening 08/02/2015   Abdominal pain 12/18/2014   Encounter for screening colonoscopy 12/18/2014   Vitamin D  deficiency 08/20/2012   Arteriosclerotic cardiovascular disease (ASCVD) 04/11/2012   Dyspepsia 11/29/2011   Chronic low back pain    Dysphagia 05/09/2011   Essential hypertension    Anxiety and depression    Controlled diabetes mellitus type 2 with complications (HCC) 10/03/2010  Cigarette smoker 09/14/2010   COPD (chronic obstructive pulmonary disease) (HCC) 09/14/2010   Mixed hyperlipidemia 11/25/2009   GERD (gastroesophageal reflux disease) 04/05/2009   Hepatic steatosis 04/05/2009    Past Surgical History:  Procedure Laterality Date   BALLOON DILATION N/A 07/25/2021   Procedure: BALLOON DILATION;  Surgeon: Cindie Carlin POUR, DO;  [facility];  Service: Endoscopy;  Laterality: N/A;   BIOPSY  07/25/2021   Procedure: BIOPSY;  Surgeon: Cindie Carlin POUR, DO;  [facility];  Service: Endoscopy;;   BIOPSY  12/04/2022   Procedure: BIOPSY;   Surgeon: Cindie Carlin POUR, DO;  [facility];  Service: Endoscopy;;   BRAVO PH STUDY  05/03/2011   DOQ:Fpoi gastritis/normal esophagus and duodenum   CARDIAC CATHETERIZATION  1990s X 1; 2005; 08/12/2015   CARDIAC CATHETERIZATION N/A 08/12/2015   Procedure: Left Heart Cath and Coronary Angiography;  Surgeon: Victory LELON Sharps, MD; LAD 20%, CFX 65%, RCA 20%, EF 60%    COLONOSCOPY  1990   COLONOSCOPY WITH PROPOFOL  N/A 11/21/2016   Dr. Harvey: non-thrombosed external hemorrhoids, one 6 mm polyp (polypoid lesion), internal hemorrhoids. TI Normal. 10 years screening   ESOPHAGOGASTRODUODENOSCOPY  05/03/2011   DOQ:fpoi gastritis   ESOPHAGOGASTRODUODENOSCOPY (EGD) WITH PROPOFOL  N/A 07/25/2021   Procedure: ESOPHAGOGASTRODUODENOSCOPY (EGD) WITH PROPOFOL ;  Surgeon: Cindie Carlin POUR, DO;  [facility];  Service: Endoscopy;  Laterality: N/A;  1:30pm   ESOPHAGOGASTRODUODENOSCOPY (EGD) WITH PROPOFOL  N/A 12/04/2022   Procedure: ESOPHAGOGASTRODUODENOSCOPY (EGD) WITH PROPOFOL ;  Surgeon: Cindie Carlin POUR, DO;  [facility];  Service: Endoscopy;  Laterality: N/A;  8:00AM;ASA 3   NECK MASS EXCISION Right    done in dr's office; behind right ear/side of ncek   POLYPECTOMY  11/21/2016   Procedure: POLYPECTOMY;  Surgeon: Harvey Margo CROME, MD;  [facility];  Service: Endoscopy;;  descending colon polyp   SHOULDER ARTHROSCOPY W/ ROTATOR CUFF REPAIR Right 2006   acromioclavicular joint arthrosis       Home Medications    Prior to Admission medications   Medication Sig Start Date End Date Taking? Authorizing Provider  clindamycin  (CLEOCIN ) 150 MG capsule Take 3 capsules (450 mg total) by mouth every 8 (eight) hours for 7 days. 01/15/24 01/22/24 Yes Chandra Harlene LABOR, NP  lidocaine  (XYLOCAINE ) 2 % solution Use as directed 15 mLs in the mouth or throat every 3 (three) hours as needed for mouth pain. 01/15/24  Yes Chandra Harlene LABOR, NP  acetaminophen  (TYLENOL ) 325 MG tablet Take 650  mg by mouth every 6 (six) hours as needed for moderate pain.    [provider]  albuterol  (VENTOLIN  HFA) 108 (90 Base) MCG/ACT inhaler INHALE 2 PUFFS BY MOUTH EVERY 6 HOURS AS NEEDED FOR COUGHING, WHEEZING, OR SHORTNESS OF BREATH 06/19/22   Comer Kirsch, PA-C  ALPRAZolam  (XANAX ) 0.5 MG tablet Take one twice a day and two at bedtime 11/12/23   Okey Barnie SAUNDERS, MD  amLODipine  (NORVASC ) 10 MG tablet Take 1 tablet by mouth once daily 12/07/22   Debera Jayson MATSU, MD  azelastine  (ASTELIN ) 0.1 % nasal spray Place 2 sprays into both nostrils 2 (two) times daily as needed. Use in each nostril as directed 09/24/23   Tobie Arleta SQUIBB, MD  budesonide -formoterol  (SYMBICORT ) 160-4.5 MCG/ACT inhaler Take 2 puffs first thing in am and then another 2 puffs about 12 hours later. 01/14/24   Darlean Ozell NOVAK, MD  dronedarone  (MULTAQ ) 400 MG tablet Take 1 tablet (400 mg total) by mouth  2 (two) times daily with a meal. 05/18/23 05/18/24  Waddell Danelle ORN, MD  EPINEPHrine  0.3 mg/0.3 mL IJ SOAJ injection Inject 0.3 mg into the muscle as needed for anaphylaxis. 09/24/23   Tobie Arleta SQUIBB, MD  esomeprazole  (NEXIUM ) 40 MG capsule Take 1 capsule (40 mg total) by mouth 2 (two) times daily before a meal. 07/19/23   Shirlean Therisa ORN, NP  fluticasone  (FLONASE ) 50 MCG/ACT nasal spray Place 2 sprays into both nostrils daily. 09/24/23   Tobie Arleta SQUIBB, MD  isosorbide  mononitrate (IMDUR ) 30 MG 24 hr tablet Take 1 tablet (30 mg total) by mouth daily. Patient taking differently: Take 1 tablet (30 mg total) by mouth daily. 05/10/23   Strader, Laymon HERO, PA-C  KLOR-CON  M20 20 MEQ tablet TAKE 1 TABLET BY MOUTH EVERY DAY 07/19/23   Dunn, Dayna N, PA-C  metoprolol  succinate (TOPROL -XL) 25 MG 24 hr tablet Take 0.5 tablets (12.5 mg total) by mouth daily. Can take an extra 12.5mg  as needed for palpitations. 11/29/23   Strader, Laymon HERO, PA-C  nitroGLYCERIN  (NITROSTAT ) 0.4 MG SL tablet Place 1 tablet (0.4 mg total) under the tongue every 5 (five)  minutes x 3 doses as needed for chest pain (if no relief after 3rd dose, proceed to the ED for an evaluation). 07/26/22   Waddell Danelle ORN, MD  rivaroxaban  (XARELTO ) 20 MG TABS tablet TAKE 1 TABLET BY MOUTH ONCE DAILY WITH SUPPER 10/08/23   Debera Jayson MATSU, MD  simvastatin  (ZOCOR ) 20 MG tablet Take 1 tablet by mouth once daily 06/22/23   Waddell Danelle ORN, MD  triamcinolone  (KENALOG ) 0.1 % paste Use as directed 1 Application in the mouth or throat 2 (two) times daily. 01/01/24   Bevely Doffing, FNP  triamcinolone  cream (KENALOG ) 0.1 % Apply 1 Application topically 2 (two) times daily. 06/18/23   Zarwolo, Gloria, FNP  vitamin B-12 (CYANOCOBALAMIN ) 50 MCG tablet Take 50 mcg by mouth daily.    [provider]  VITAMIN D  PO Take 1 tablet by mouth daily.    [provider]    Family History Family History  Problem Relation Age of Onset   Lung cancer Mother    Alcohol abuse Mother    Heart attack Father 87   Diabetes Father    Alcohol abuse Father    Asthma Sister    Anxiety disorder Sister    Depression Sister    Anxiety disorder Sister    Hypertension Brother    Hypertension Brother    Heart attack Brother 73   Diabetes Brother    Hypertension Brother    Seizures Brother    Dementia Paternal Uncle    ADD / ADHD Daughter    Dementia Cousin    Colon cancer Neg Hx    Drug abuse Neg Hx    Bipolar disorder Neg Hx    OCD Neg Hx    Paranoid behavior Neg Hx    Schizophrenia Neg Hx    Sexual abuse Neg Hx    Physical abuse Neg Hx     Social History Social History   Tobacco Use   Smoking status: Every Day    Current packs/day: 0.50    Average packs/day: 1 pack/day for 41.5 years (40.0 ttl pk-yrs)    Types: Cigarettes    Start date: 07/17/1982   Smokeless tobacco: Never   Tobacco comments:    1/2 pack a day  Vaping Use   Vaping status: Never Used  Substance Use Topics   Alcohol use: Not  Currently   Drug use: No     Allergies   Alpha-gal, Dexilant   [dexlansoprazole ], Mushroom ext cmplx(shiitake-reishi-mait), Penicillins, and Doxycycline   Review of Systems Review of Systems Per HPI  Physical Exam Triage Vital Signs ED Triage Vitals  Encounter Vitals Group     BP 01/15/24 1032 130/73     Girls Systolic BP Percentile --      Girls Diastolic BP Percentile --      Boys Systolic BP Percentile --      Boys Diastolic BP Percentile --      Pulse Rate 01/15/24 1032 (!) 58     Resp 01/15/24 1032 20     Temp 01/15/24 1032 97.9 F (36.6 C)     Temp Source 01/15/24 1032 Oral     SpO2 01/15/24 1032 97 %     Weight --      Height --      Head Circumference --      Peak Flow --      Pain Score 01/15/24 1033 7     Pain Loc --      Pain Education --      Exclude from Growth Chart --    No data found.  Updated Vital Signs BP 130/73 (BP Location: Right Arm)   Pulse (!) 58   Temp 97.9 F (36.6 C) (Oral)   Resp 20   SpO2 97%   Visual Acuity Right Eye Distance:   Left Eye Distance:   Bilateral Distance:    Right Eye Near:   Left Eye Near:    Bilateral Near:     Physical Exam Vitals and nursing note reviewed.  Constitutional:      General: He is not in acute distress.    Appearance: Normal appearance. He is not toxic-appearing.  HENT:     Head: Normocephalic and atraumatic.     Right Ear: Tympanic membrane, ear canal and external ear normal.     Left Ear: Tympanic membrane, ear canal and external ear normal.     Mouth/Throat:     Mouth: Mucous membranes are moist.     Dentition: Abnormal dentition. Dental tenderness and dental caries present. No gingival swelling or dental abscesses.     Pharynx: Oropharynx is clear. No oropharyngeal exudate or posterior oropharyngeal erythema.     Comments: Multiple missing and broken teeth bottom jaw; upper jaw no teeth present  Cardiovascular:     Rate and Rhythm: Normal rate.  Pulmonary:     Effort: Pulmonary effort is normal. No respiratory distress.   Musculoskeletal:      Cervical back: Normal range of motion.  Lymphadenopathy:     Cervical: No cervical adenopathy.   Skin:    General: Skin is warm and dry.     Capillary Refill: Capillary refill takes less than 2 seconds.     Coloration: Skin is not jaundiced or pale.     Findings: No erythema.   Neurological:     Mental Status: He is alert and oriented to person, place, and time.   Psychiatric:        Behavior: Behavior is cooperative.      UC Treatments / Results  Labs (all labs ordered are listed, but only abnormal results are displayed) Labs Reviewed - No data to display  EKG   Radiology No results found.  Procedures Procedures (including critical care time)  Medications Ordered in UC Medications - No data to display  Initial Impression / Assessment and Plan /  UC Course  I have reviewed the triage vital signs and the nursing notes.  Pertinent labs & imaging results that were available during my care of the patient were reviewed by me and considered in my medical decision making (see chart for details).   Patient is well-appearing, normotensive, afebrile, not tachycardic, not tachypneic, oxygenating well on room air.   1. Dentalgia Will cover for dental abscess with Clindamycin  three times daily for 7 days; patient has history of anaphylaxis with penicillin Continue Tylenol /ice for pain control and start lidocaine  topically additionally Recommended close follow up with Dentist and resource guide given today   The patient was given the opportunity to ask questions.  All questions answered to their satisfaction.  The patient is in agreement to this plan.   Final Clinical Impressions(s) / UC Diagnoses   Final diagnoses:  Dentalgia     Discharge Instructions      We are treating you for a dental infection with Clindamycin  today.  You can continue Tylenol  and ice as needed for pain as well as the topical lidocaine  that has been sent to the pharmacy.  Follow up with a  Dentist.    ED Prescriptions     Medication Sig Dispense Auth. Provider   clindamycin  (CLEOCIN ) 150 MG capsule Take 3 capsules (450 mg total) by mouth every 8 (eight) hours for 7 days. 63 capsule Chandra Raisin A, NP   lidocaine  (XYLOCAINE ) 2 % solution Use as directed 15 mLs in the mouth or throat every 3 (three) hours as needed for mouth pain. 100 mL Chandra Raisin LABOR, NP      PDMP not reviewed this encounter.   Chandra Raisin LABOR, NP 01/15/24 1213

## 2024-01-17 ENCOUNTER — Encounter (HOSPITAL_COMMUNITY)
Admission: RE | Admit: 2024-01-17 | Discharge: 2024-01-17 | Disposition: A | Discharge status: Home / Self Care | Source: Ambulatory Visit | Attending: Acute Care | Admitting: Acute Care

## 2024-01-17 DIAGNOSIS — R911 Solitary pulmonary nodule: Secondary | ICD-10-CM | POA: Diagnosis not present

## 2024-01-17 MED ORDER — FLUDEOXYGLUCOSE F - 18 (FDG) INJECTION
9.0600 | Freq: Once | INTRAVENOUS | Status: AC | PRN
  Administered 2024-01-17: 9.06 via INTRAVENOUS

## 2024-01-21 ENCOUNTER — Ambulatory Visit (INDEPENDENT_AMBULATORY_CARE_PROVIDER_SITE_OTHER): Admitting: Family Medicine

## 2024-01-21 ENCOUNTER — Encounter: Payer: Self-pay | Admitting: Family Medicine

## 2024-01-21 VITALS — BP 111/67 | HR 60 | Ht 72.0 in | Wt 172.1 lb

## 2024-01-21 DIAGNOSIS — E7849 Other hyperlipidemia: Secondary | ICD-10-CM

## 2024-01-21 DIAGNOSIS — R7301 Impaired fasting glucose: Secondary | ICD-10-CM

## 2024-01-21 DIAGNOSIS — E038 Other specified hypothyroidism: Secondary | ICD-10-CM | POA: Diagnosis not present

## 2024-01-21 DIAGNOSIS — E118 Type 2 diabetes mellitus with unspecified complications: Secondary | ICD-10-CM

## 2024-01-21 DIAGNOSIS — I10 Essential (primary) hypertension: Secondary | ICD-10-CM | POA: Diagnosis not present

## 2024-01-21 DIAGNOSIS — E559 Vitamin D deficiency, unspecified: Secondary | ICD-10-CM

## 2024-01-21 NOTE — Assessment & Plan Note (Signed)
 Controlled, no change in medication

## 2024-01-21 NOTE — Progress Notes (Signed)
 Established Patient Office Visit  Subjective:  Patient ID: Richard [name], male    DOB: [birth year]  Age: 58 y.o. MRN: [id number]  CC:  Chief Complaint  Patient presents with   Hypertension    4 month follow up     HPI Richard [name] is a 58 y.o. male with past medical history of essential hypertension, controlled type 2 diabetes, GERD presents for f/u of  chronic medical conditions. For the details of today's visit, please refer to the assessment and plan.     Past Medical History:  Diagnosis Date   Arthritis    Asthma    CAD (coronary artery disease)    a. Cardiac cath 07/2015 showed 65% distal Cx, 20% mid-distal LAD, 20% prox-distal RCA, EF 60%, EDP .   Colitis 1990   COPD (chronic obstructive pulmonary disease) (HCC)    Depression    Dysrhythmia    Essential hypertension    Fatty liver    Gastric ulcer 2003; 2012   2003: + esophagitis; negative H.pylori serology  2012: Dr. Harvey, mild gastritis, Bravo PH probe placement, negative H.pylori   GERD (gastroesophageal reflux disease)    Hepatic steatosis    History of hiatal hernia    Hyperlipemia    Overweight    Panic attacks    Paroxysmal atrial fibrillation (HCC)    Pulmonary nodules    Stroke (HCC)    TIA (transient ischemic attack)    Type II diabetes mellitus (HCC)     Past Surgical History:  Procedure Laterality Date   BALLOON DILATION N/A 07/25/2021   Procedure: BALLOON DILATION;  Surgeon: Cindie Carlin POUR, DO;  Location: [facility];  Service: Endoscopy;  Laterality: N/A;   BIOPSY  07/25/2021   Procedure: BIOPSY;  Surgeon: Cindie Carlin POUR, DO;  Location: [facility];  Service: Endoscopy;;   BIOPSY  12/04/2022   Procedure: BIOPSY;  Surgeon: Cindie Carlin POUR, DO;  Location: [facility];  Service: Endoscopy;;   BRAVO PH STUDY  05/03/2011   DOQ:Fpoi gastritis/normal esophagus and duodenum   CARDIAC CATHETERIZATION  1990s X 1; 2005; 08/12/2015   CARDIAC CATHETERIZATION N/A 08/12/2015   Procedure:  Left Heart Cath and Coronary Angiography;  Surgeon: Victory LELON Sharps, MD; LAD 20%, CFX 65%, RCA 20%, EF 60%    COLONOSCOPY  1990   COLONOSCOPY WITH PROPOFOL  N/A 11/21/2016   Dr. Harvey: non-thrombosed external hemorrhoids, one 6 mm polyp (polypoid lesion), internal hemorrhoids. TI Normal. 10 years screening   ESOPHAGOGASTRODUODENOSCOPY  05/03/2011   DOQ:fpoi gastritis   ESOPHAGOGASTRODUODENOSCOPY (EGD) WITH PROPOFOL  N/A 07/25/2021   Procedure: ESOPHAGOGASTRODUODENOSCOPY (EGD) WITH PROPOFOL ;  Surgeon: Cindie Carlin POUR, DO;  Location: [facility];  Service: Endoscopy;  Laterality: N/A;  1:30pm   ESOPHAGOGASTRODUODENOSCOPY (EGD) WITH PROPOFOL  N/A 12/04/2022   Procedure: ESOPHAGOGASTRODUODENOSCOPY (EGD) WITH PROPOFOL ;  Surgeon: Cindie Carlin POUR, DO;  Location: [facility];  Service: Endoscopy;  Laterality: N/A;  8:00AM;ASA 3   NECK MASS EXCISION Right    done in dr's office; behind right ear/side of ncek   POLYPECTOMY  11/21/2016   Procedure: POLYPECTOMY;  Surgeon: Harvey Margo LITTIE, MD;  Location: [facility];  Service: Endoscopy;;  descending colon polyp   SHOULDER ARTHROSCOPY W/ ROTATOR CUFF REPAIR Right 2006   acromioclavicular joint arthrosis    Family History  Problem Relation Age of Onset   Lung cancer Mother    Alcohol abuse Mother    Heart attack Father 62   Diabetes Father  Alcohol abuse Father    Asthma Sister    Anxiety disorder Sister    Depression Sister    Anxiety disorder Sister    Hypertension Brother    Hypertension Brother    Heart attack Brother 9   Diabetes Brother    Hypertension Brother    Seizures Brother    Dementia Paternal Uncle    ADD / ADHD Daughter    Dementia Cousin    Colon cancer Neg Hx    Drug abuse Neg Hx    Bipolar disorder Neg Hx    OCD Neg Hx    Paranoid behavior Neg Hx    Schizophrenia Neg Hx    Sexual abuse Neg Hx    Physical abuse Neg Hx     Social History   Socioeconomic History   Marital status: Married    Spouse name: Not on  file   Number of children: Not on file   Years of education: Not on file   Highest education level: Not on file  Occupational History   Occupation: full time    Employer: UNEMPLOYED  Tobacco Use   Smoking status: Every Day    Current packs/day: 0.50    Average packs/day: 1 pack/day for 41.5 years (40.0 ttl pk-yrs)    Types: Cigarettes    Start date: 07/17/1982   Smokeless tobacco: Never   Tobacco comments:    1/2 pack a day  Vaping Use   Vaping status: Never Used  Substance and Sexual Activity   Alcohol use: Not Currently   Drug use: No   Sexual activity: Yes    Birth control/protection: None  Other Topics Concern   Not on file  Social History Narrative   Pt lives in [place] with wife.  5 children.  Unemployed due to panic attacks and back pain   Social Drivers of Corporate investment banker Strain: Not on file  Food Insecurity: Not on file  Transportation Needs: Not on file  Physical Activity: Not on file  Stress: Not on file  Social Connections: Not on file  Intimate Partner Violence: Not on file    Outpatient Medications Prior to Visit  Medication Sig Dispense Refill   acetaminophen  (TYLENOL ) 325 MG tablet Take 650 mg by mouth every 6 (six) hours as needed for moderate pain.     albuterol  (VENTOLIN  HFA) 108 (90 Base) MCG/ACT inhaler INHALE 2 PUFFS BY MOUTH EVERY 6 HOURS AS NEEDED FOR COUGHING, WHEEZING, OR SHORTNESS OF BREATH 20.1 g 0   ALPRAZolam  (XANAX ) 0.5 MG tablet Take one twice a day and two at bedtime 120 tablet 2   amLODipine  (NORVASC ) 10 MG tablet Take 1 tablet by mouth once daily 90 tablet 3   azelastine  (ASTELIN ) 0.1 % nasal spray Place 2 sprays into both nostrils 2 (two) times daily as needed. Use in each nostril as directed 30 mL 11   budesonide -formoterol  (SYMBICORT ) 160-4.5 MCG/ACT inhaler Take 2 puffs first thing in am and then another 2 puffs about 12 hours later. 1 each 12   clindamycin  (CLEOCIN ) 75 MG/5ML solution Take 30 mLs (450 mg total) by  mouth 3 (three) times daily for 7 days. 100 mL 0   dronedarone  (MULTAQ ) 400 MG tablet Take 1 tablet (400 mg total) by mouth 2 (two) times daily with a meal. 180 tablet 3   EPINEPHrine  0.3 mg/0.3 mL IJ SOAJ injection Inject 0.3 mg into the muscle as needed for anaphylaxis. 2 each 1   esomeprazole  (NEXIUM ) 40 MG  capsule Take 1 capsule (40 mg total) by mouth 2 (two) times daily before a meal. 60 capsule 5   fluticasone  (FLONASE ) 50 MCG/ACT nasal spray Place 2 sprays into both nostrils daily. 16 g 11   isosorbide  mononitrate (IMDUR ) 30 MG 24 hr tablet Take 1 tablet (30 mg total) by mouth daily. (Patient taking differently: Take 1 tablet (30 mg total) by mouth daily.) 90 tablet 3   KLOR-CON  M20 20 MEQ tablet TAKE 1 TABLET BY MOUTH EVERY DAY 90 tablet 2   lidocaine  (XYLOCAINE ) 2 % solution Use as directed 15 mLs in the mouth or throat every 3 (three) hours as needed for mouth pain. 100 mL 0   metoprolol  succinate (TOPROL -XL) 25 MG 24 hr tablet Take 0.5 tablets (12.5 mg total) by mouth daily. Can take an extra 12.5mg  as needed for palpitations. 60 tablet 3   nitroGLYCERIN  (NITROSTAT ) 0.4 MG SL tablet Place 1 tablet (0.4 mg total) under the tongue every 5 (five) minutes x 3 doses as needed for chest pain (if no relief after 3rd dose, proceed to the ED for an evaluation). 25 tablet 3   rivaroxaban  (XARELTO ) 20 MG TABS tablet TAKE 1 TABLET BY MOUTH ONCE DAILY WITH SUPPER 90 tablet 1   simvastatin  (ZOCOR ) 20 MG tablet Take 1 tablet by mouth once daily 90 tablet 3   triamcinolone  (KENALOG ) 0.1 % paste Use as directed 1 Application in the mouth or throat 2 (two) times daily. 5 g 0   triamcinolone  cream (KENALOG ) 0.1 % Apply 1 Application topically 2 (two) times daily. 15 g 2   vitamin B-12 (CYANOCOBALAMIN ) 50 MCG tablet Take 50 mcg by mouth daily.     VITAMIN D  PO Take 1 tablet by mouth daily.     No facility-administered medications prior to visit.    Allergies  Allergen Reactions   Alpha-Gal Anaphylaxis    Dexilant  [Dexlansoprazole ] Anaphylaxis   Mushroom Ext Cmplx(Shiitake-Reishi-Mait) Anaphylaxis    Rapid heart rate.   Penicillins Anaphylaxis    Has patient had a PCN reaction causing immediate rash, facial/tongue/throat swelling, SOB or lightheadedness with hypotension: Yes Has patient had a PCN reaction causing severe rash involving mucus membranes or skin necrosis: No Has patient had a PCN reaction that required hospitalization Yes Has patient had a PCN reaction occurring within the last 10 years: No If all of the above answers are NO, then may proceed with Cephalosporin use.    Doxycycline Nausea And Vomiting         ROS Review of Systems  Constitutional:  Negative for fatigue and fever.  Eyes:  Negative for visual disturbance.  Respiratory:  Negative for chest tightness and shortness of breath.   Cardiovascular:  Negative for chest pain and palpitations.  Neurological:  Negative for dizziness and headaches.      Objective:    Physical Exam HENT:     Head: Normocephalic.     Right Ear: External ear normal.     Left Ear: External ear normal.     Nose: No congestion or rhinorrhea.     Mouth/Throat:     Mouth: Mucous membranes are moist.  Cardiovascular:     Rate and Rhythm: Regular rhythm.     Heart sounds: No murmur heard. Pulmonary:     Effort: No respiratory distress.     Breath sounds: Normal breath sounds.  Neurological:     Mental Status: He is alert.     BP 111/67   Pulse 60   Ht 6' (  1.829 m)   Wt 172 lb 1.9 oz (78.1 kg)   SpO2 96%   BMI 23.34 kg/m  Wt Readings from Last 3 Encounters:  01/21/24 172 lb 1.9 oz (78.1 kg)  01/14/24 173 lb (78.5 kg)  01/01/24 174 lb (78.9 kg)    Lab Results  Component Value Date   TSH 1.330 09/20/2023   Lab Results  Component Value Date   WBC 6.4 09/20/2023   HGB 16.0 09/20/2023   HCT 46.2 09/20/2023   MCV 94 09/20/2023   PLT 185 09/20/2023   Lab Results  Component Value Date   NA 136 11/29/2023   K  4.0 11/29/2023   CO2 24 11/29/2023   GLUCOSE 112 (H) 11/29/2023   BUN 14 11/29/2023   CREATININE 0.90 11/29/2023   BILITOT 0.4 09/20/2023   ALKPHOS 60 09/20/2023   AST 12 09/20/2023   ALT 16 09/20/2023   PROT 6.8 09/20/2023   ALBUMIN 4.6 09/20/2023   CALCIUM  8.9 11/29/2023   ANIONGAP 8 11/29/2023   EGFR 94 09/20/2023   Lab Results  Component Value Date   CHOL 117 09/20/2023   Lab Results  Component Value Date   HDL 43 09/20/2023   Lab Results  Component Value Date   LDLCALC 60 09/20/2023   Lab Results  Component Value Date   TRIG 65 09/20/2023   Lab Results  Component Value Date   CHOLHDL 2.7 09/20/2023   Lab Results  Component Value Date   HGBA1C 5.8 (H) 09/20/2023      Assessment & Plan:  Essential hypertension Assessment & Plan: Controlled, no change in medication   Controlled diabetes mellitus type 2 with complications, unspecified whether long term insulin  use (HCC) Assessment & Plan: No changes to treatment regimen  I recommended reducing his intake of high-sugar foods and beverages and increasing physical activity to 150 minutes of moderate intensity per week, as tolerated   Orders: -     Microalbumin / creatinine urine ratio -     HM Diabetes Foot Exam  IFG (impaired fasting glucose) -     Hemoglobin A1c  Vitamin D  deficiency -     VITAMIN D  25 Hydroxy (Vit-D Deficiency, Fractures)  TSH (thyroid -stimulating hormone deficiency) -     TSH + free T4  Other hyperlipidemia -     Lipid panel -     CMP14+EGFR -     CBC with Differential/Platelet  Note: This chart has been completed using Engineer, civil (consulting) software, and while attempts have been made to ensure accuracy, certain words and phrases may not be transcribed as intended.    Follow-up: Return in about 4 months (around 05/23/2024).   Nyeli Holtmeyer, FNP

## 2024-01-21 NOTE — Assessment & Plan Note (Addendum)
 No changes to treatment regimen  I recommended reducing his intake of high-sugar foods and beverages and increasing physical activity to 150 minutes of moderate intensity per week, as tolerated

## 2024-01-21 NOTE — Patient Instructions (Addendum)
 I appreciate the opportunity to provide care to you today!    Follow up:  4 months  Labs: please stop by the lab during the week to get your blood drawn (CBC, CMP, TSH, Lipid profile, HgA1c, Vit D)  For a Healthier YOU, I Recommend: Reducing your intake of sugar, sodium, carbohydrates, and saturated fats. Increasing your fiber intake by incorporating more whole grains, fruits, and vegetables into your meals. Setting healthy goals with a focus on lowering your consumption of carbs, sugar, and unhealthy fats. Adding variety to your diet by including a wide range of fruits and vegetables. Cutting back on soda and limiting processed foods as much as possible. Staying active: In addition to taking your weight loss medication, aim for at least 150 minutes of moderate-intensity physical activity each week for optimal results.    Please follow up if your symptoms worsen or fail to improve.   Please continue to a heart-healthy diet and increase your physical activities. Try to exercise for at least five days a week.    It was a pleasure to see you and I look forward to continuing to work together on your health and well-being. Please do not hesitate to call the office if you need care or have questions about your care.  In case of emergency, please visit the Emergency Department for urgent care, or contact our clinic at [phone] to schedule an appointment. We're here to help you!   Have a wonderful day and week. With Gratitude, Jalei Shibley MSN, FNP-BC

## 2024-01-22 ENCOUNTER — Ambulatory Visit (INDEPENDENT_AMBULATORY_CARE_PROVIDER_SITE_OTHER): Payer: Medicaid Other | Admitting: Gastroenterology

## 2024-01-22 ENCOUNTER — Encounter: Payer: Self-pay | Admitting: Gastroenterology

## 2024-01-22 VITALS — BP 109/60 | HR 55 | Temp 97.9°F | Ht 72.0 in | Wt 173.6 lb

## 2024-01-22 DIAGNOSIS — Z860100 Personal history of colon polyps, unspecified: Secondary | ICD-10-CM | POA: Diagnosis not present

## 2024-01-22 DIAGNOSIS — K219 Gastro-esophageal reflux disease without esophagitis: Secondary | ICD-10-CM

## 2024-01-22 DIAGNOSIS — K760 Fatty (change of) liver, not elsewhere classified: Secondary | ICD-10-CM

## 2024-01-22 NOTE — Progress Notes (Signed)
 Gastroenterology Office Note     Primary Care Physician:  Zarwolo, Gloria, FNP  Primary Gastroenterologist: Dr. Cindie    Chief Complaint   Chief Complaint  Patient presents with   Gastroesophageal Reflux    Pt arrives for follow up. GERD doing somewhat ok. Pt states some abdominal pain is off and on. Pt states things have been moving.      History of Present Illness   Richard [name] is a 58 y.o. male presenting today with a history of chronic GERD, abdominal pain, dysphagia, constipation, weight loss, esophageal dysmotility but unable to complete manometry due to transportation, hepatic steatosis (no fibrosis with ELF 8.58). Last seen in feb 2025 and noted constipation. Started on Linzess  72 mcg daily.    Today: Bulging from rectum when using bathroom. Can feel in the shower sometimes. Sometimes feels like it affects bowel movements. No rectal pain. No rectal bleeding. Sometimes reduces, sometimes not. States no issues with constipation and not taking linzess . Declining rectal exam. Eating high fiber. Doesn't feel he needs any help with constipation.   Had LLQ abdominal pain a few weeks ago that came out of the blue. Doubled him over and lasted about 8-10 hours then eased off. Nexium  40 mg BID for GERD.    EGD May 2024: abnormal esophageal motility, consistent with presbyesophagus, suspicious changes for Barrett's but negative path, gastritis, normal duodenum. Negative H.pylori.    EGD 07/2021: small hiatal hernia, abnormal esophageal motility, iron pill gastritis, no h.pylori, mucosal variant in duodenum (bx with focal lymphangiectasia, neg for celiac)    Last colonoscopy 2018, 1 benign polyp removed, recommended 10-year recall.    Past Medical History:  Diagnosis Date   Arthritis    Asthma    CAD (coronary artery disease)    a. Cardiac cath 07/2015 showed 65% distal Cx, 20% mid-distal LAD, 20% prox-distal RCA, EF 60%, EDP .   Colitis 1990   COPD (chronic  obstructive pulmonary disease) (HCC)    Depression    Dysrhythmia    Essential hypertension    Fatty liver    Gastric ulcer 2003; 2012   2003: + esophagitis; negative H.pylori serology  2012: Dr. Harvey, mild gastritis, Bravo PH probe placement, negative H.pylori   GERD (gastroesophageal reflux disease)    Hepatic steatosis    History of hiatal hernia    Hyperlipemia    Overweight    Panic attacks    Paroxysmal atrial fibrillation (HCC)    Pulmonary nodules    Stroke (HCC)    TIA (transient ischemic attack)    Type II diabetes mellitus (HCC)     Past Surgical History:  Procedure Laterality Date   BALLOON DILATION N/A 07/25/2021   Procedure: BALLOON DILATION;  Surgeon: Cindie Carlin POUR, DO;  [facility];  Service: Endoscopy;  Laterality: N/A;   BIOPSY  07/25/2021   Procedure: BIOPSY;  Surgeon: Cindie Carlin POUR, DO;  [facility];  Service: Endoscopy;;   BIOPSY  12/04/2022   Procedure: BIOPSY;  Surgeon: Cindie Carlin POUR, DO;  [facility];  Service: Endoscopy;;   BRAVO PH STUDY  05/03/2011   DOQ:Fpoi gastritis/normal esophagus and duodenum   CARDIAC CATHETERIZATION  1990s X 1; 2005; 08/12/2015   CARDIAC CATHETERIZATION N/A 08/12/2015   Procedure: Left Heart Cath and Coronary Angiography;  Surgeon: Victory LELON Sharps, MD; LAD 20%, CFX 65%, RCA 20%, EF 60%    COLONOSCOPY  1990   COLONOSCOPY WITH PROPOFOL  N/A 11/21/2016  Dr. Harvey: non-thrombosed external hemorrhoids, one 6 mm polyp (polypoid lesion), internal hemorrhoids. TI Normal. 10 years screening   ESOPHAGOGASTRODUODENOSCOPY  05/03/2011   DOQ:fpoi gastritis   ESOPHAGOGASTRODUODENOSCOPY (EGD) WITH PROPOFOL  N/A 07/25/2021   Procedure: ESOPHAGOGASTRODUODENOSCOPY (EGD) WITH PROPOFOL ;  Surgeon: Cindie Carlin POUR, DO;  [facility];  Service: Endoscopy;  Laterality: N/A;  1:30pm   ESOPHAGOGASTRODUODENOSCOPY (EGD) WITH PROPOFOL  N/A 12/04/2022   Procedure: ESOPHAGOGASTRODUODENOSCOPY (EGD) WITH  PROPOFOL ;  Surgeon: Cindie Carlin POUR, DO;  [facility];  Service: Endoscopy;  Laterality: N/A;  8:00AM;ASA 3   NECK MASS EXCISION Right    done in dr's office; behind right ear/side of ncek   POLYPECTOMY  11/21/2016   Procedure: POLYPECTOMY;  Surgeon: Harvey Margo CROME, MD;  [facility]: [facility];  Service: Endoscopy;;  descending colon polyp   SHOULDER ARTHROSCOPY W/ ROTATOR CUFF REPAIR Right 2006   acromioclavicular joint arthrosis    Current Outpatient Medications  Medication Sig Dispense Refill   acetaminophen  (TYLENOL ) 325 MG tablet Take 650 mg by mouth every 6 (six) hours as needed for moderate pain.     albuterol  (VENTOLIN  HFA) 108 (90 Base) MCG/ACT inhaler INHALE 2 PUFFS BY MOUTH EVERY 6 HOURS AS NEEDED FOR COUGHING, WHEEZING, OR SHORTNESS OF BREATH 20.1 g 0   ALPRAZolam  (XANAX ) 0.5 MG tablet Take one twice a day and two at bedtime 120 tablet 2   amLODipine  (NORVASC ) 10 MG tablet Take 1 tablet by mouth once daily 90 tablet 3   azelastine  (ASTELIN ) 0.1 % nasal spray Place 2 sprays into both nostrils 2 (two) times daily as needed. Use in each nostril as directed 30 mL 11   budesonide -formoterol  (SYMBICORT ) 160-4.5 MCG/ACT inhaler Take 2 puffs first thing in am and then another 2 puffs about 12 hours later. 1 each 12   clindamycin  (CLEOCIN ) 75 MG/5ML solution Take 30 mLs (450 mg total) by mouth 3 (three) times daily for 7 days. 100 mL 0   dronedarone  (MULTAQ ) 400 MG tablet Take 1 tablet (400 mg total) by mouth 2 (two) times daily with a meal. 180 tablet 3   EPINEPHrine  0.3 mg/0.3 mL IJ SOAJ injection Inject 0.3 mg into the muscle as needed for anaphylaxis. 2 each 1   esomeprazole  (NEXIUM ) 40 MG capsule Take 1 capsule (40 mg total) by mouth 2 (two) times daily before a meal. 60 capsule 5   fluticasone  (FLONASE ) 50 MCG/ACT nasal spray Place 2 sprays into both nostrils daily. 16 g 11   isosorbide  mononitrate (IMDUR ) 30 MG 24 hr tablet Take 1 tablet (30 mg total) by mouth daily.  (Patient taking differently: Take 1 tablet (30 mg total) by mouth daily.) 90 tablet 3   KLOR-CON  M20 20 MEQ tablet TAKE 1 TABLET BY MOUTH EVERY DAY 90 tablet 2   lidocaine  (XYLOCAINE ) 2 % solution Use as directed 15 mLs in the mouth or throat every 3 (three) hours as needed for mouth pain. 100 mL 0   metoprolol  succinate (TOPROL -XL) 25 MG 24 hr tablet Take 0.5 tablets (12.5 mg total) by mouth daily. Can take an extra 12.5mg  as needed for palpitations. 60 tablet 3   nitroGLYCERIN  (NITROSTAT ) 0.4 MG SL tablet Place 1 tablet (0.4 mg total) under the tongue every 5 (five) minutes x 3 doses as needed for chest pain (if no relief after 3rd dose, proceed to the ED for an evaluation). 25 tablet 3   rivaroxaban  (XARELTO ) 20 MG TABS tablet TAKE 1 TABLET BY MOUTH ONCE DAILY WITH  SUPPER 90 tablet 1   simvastatin  (ZOCOR ) 20 MG tablet Take 1 tablet by mouth once daily 90 tablet 3   triamcinolone  (KENALOG ) 0.1 % paste Use as directed 1 Application in the mouth or throat 2 (two) times daily. 5 g 0   triamcinolone  cream (KENALOG ) 0.1 % Apply 1 Application topically 2 (two) times daily. 15 g 2   vitamin B-12 (CYANOCOBALAMIN ) 50 MCG tablet Take 50 mcg by mouth daily.     VITAMIN D  PO Take 1 tablet by mouth daily.     No current facility-administered medications for this visit.    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   Dexilant  [dexlansoprazole ] Anaphylaxis 01/20/2015   Mushroom ext cmplx(shiitake-reishi-mait) Anaphylaxis 03/22/2011   Penicillins Anaphylaxis    Doxycycline Nausea And Vomiting 04/18/2016    Family History  Problem Relation Age of Onset   Lung cancer Mother    Alcohol abuse Mother    Heart attack Father 54   Diabetes Father    Alcohol abuse Father    Asthma Sister    Anxiety disorder Sister    Depression Sister    Anxiety disorder Sister    Hypertension Brother    Hypertension Brother    Heart attack  Brother 59   Diabetes Brother    Hypertension Brother    Seizures Brother    Dementia Paternal Uncle    ADD / ADHD Daughter    Dementia Cousin    Colon cancer Neg Hx    Drug abuse Neg Hx    Bipolar disorder Neg Hx    OCD Neg Hx    Paranoid behavior Neg Hx    Schizophrenia Neg Hx    Sexual abuse Neg Hx    Physical abuse Neg Hx     Social History   Socioeconomic History   Marital status: Married    Spouse name: Not on file   Number of children: Not on file   Years of education: Not on file   Highest education level: Not on file  Occupational History   Occupation: full time    Employer: UNEMPLOYED  Tobacco Use   Smoking status: Every Day    Current packs/day: 0.50    Average packs/day: 1 pack/day for 41.5 years (40.0 ttl pk-yrs)    Types: Cigarettes    Start date: 07/17/1982   Smokeless tobacco: Never   Tobacco comments:    1/2 pack a day  Vaping Use   Vaping status: Never Used  Substance and Sexual Activity   Alcohol use: Not Currently   Drug use: No   Sexual activity: Yes    Birth control/protection: None  Other Topics Concern   Not on file  Social History Narrative   Pt lives in [place] with wife.  5 children.  Unemployed due to panic attacks and back pain   Social Drivers of Corporate investment banker Strain: Not on file  Food Insecurity: Not on file  Transportation Needs: Not on file  Physical Activity: Not on file  Stress: Not on file  Social Connections: Not on file  Intimate Partner Violence: Not on file     Review of Systems   Gen: Denies any fever, chills, fatigue, weight loss, lack of appetite.  CV: Denies chest pain, heart palpitations, peripheral edema, syncope.  Resp: Denies shortness of breath at rest or with exertion. Denies wheezing or cough.  GI: Denies dysphagia or odynophagia. Denies jaundice, hematemesis,  fecal incontinence. GU : Denies urinary burning, urinary frequency, urinary hesitancy MS: Denies joint pain, muscle  weakness, cramps, or limitation of movement.  Derm: Denies rash, itching, dry skin Psych: Denies depression, anxiety, memory loss, and confusion Heme: Denies bruising, bleeding, and enlarged lymph nodes.   Physical Exam   BP 109/60   Pulse (!) 55   Temp 97.9 F (36.6 C)   Ht 6' (1.829 m)   Wt 173 lb 9.6 oz (78.7 kg)   BMI 23.54 kg/m  General:   Alert and oriented. Pleasant and cooperative. Well-nourished and well-developed.  Head:  Normocephalic and atraumatic. Eyes:  Without icterus Abdomen:  +BS, soft, non-tender and non-distended. No HSM noted. No guarding or rebound. No masses appreciated.  Rectal:  Deferred  Msk:  Symmetrical without gross deformities. Normal posture. Extremities:  Without edema. Neurologic:  Alert and  oriented x4;  grossly normal neurologically. Skin:  Intact without significant lesions or rashes. Psych:  Alert and cooperative. Normal mood and affect.   Assessment   Richard [name] is a 58 y.o. male presenting today with a history of chronic GERD, abdominal pain, dysphagia, constipation, prior weight loss now stablilized esophageal dysmotility but unable to complete manometry due to transportation, hepatic steatosis (no fibrosis with ELF 8.58), for routine follow-up.  Constipation: resolved with high fiber. Previously had taken Linzess  72 mcg samples at last visit but no longer requiring.   Suspected prolapsing internal hemorrhoids per his description today. He declined rectal exam. Colonoscopy 2018 with benign polyp. Strongly encourage rectal when willing.   GERD: overall has done best on Nexium  BID. Known history of esophageal dysmotility but unable to complete manometry as declining to drive in [place].   Brief episode of LLQ abdominal pain: few weeks ago. Suspect could be r/t underlying constipation. Physical exam benign. Call if recurs. Not consistent with diverticulitis although unable to exclude a low grade inflammation now resolved.       PLAN    Nexium  BID Strongly advise rectal exam when patient is willing. Avoid straining, avoid constipation, continue high fiber, limit toilet time to 2-3 minutes 6 month return   Therisa MICAEL Stager, PhD, [facility]astroenter[facility]

## 2024-01-22 NOTE — Patient Instructions (Signed)
 Continue Nexium  twice a day as you are doing.  Please let me know if you change your mind about the rectal exam! Avoid straining, limit toilet time to 2-3 minutes.   We will see you back in 6 months!  I enjoyed seeing you again today! I value our relationship and want to provide genuine, compassionate, and quality care. You may receive a survey regarding your visit with me, and I welcome your feedback! Thanks so much for taking the time to complete this. I look forward to seeing you again.      Therisa MICAEL Stager, PhD, ANP-BC [facility] Gastroenterology

## 2024-01-25 ENCOUNTER — Ambulatory Visit: Payer: Self-pay | Admitting: Family Medicine

## 2024-01-25 LAB — CBC WITH DIFFERENTIAL/PLATELET
Basophils Absolute: 0 x10E3/uL (ref 0.0–0.2)
Basos: 0 %
EOS (ABSOLUTE): 0.2 x10E3/uL (ref 0.0–0.4)
Eos: 3 %
Hematocrit: 45.5 % (ref 37.5–51.0)
Hemoglobin: 15.1 g/dL (ref 13.0–17.7)
Immature Grans (Abs): 0 x10E3/uL (ref 0.0–0.1)
Immature Granulocytes: 0 %
Lymphocytes Absolute: 2.5 x10E3/uL (ref 0.7–3.1)
Lymphs: 37 %
MCH: 31.6 pg (ref 26.6–33.0)
MCHC: 33.2 g/dL (ref 31.5–35.7)
MCV: 95 fL (ref 79–97)
Monocytes Absolute: 0.5 x10E3/uL (ref 0.1–0.9)
Monocytes: 8 %
Neutrophils Absolute: 3.5 x10E3/uL (ref 1.4–7.0)
Neutrophils: 52 %
Platelets: 195 x10E3/uL (ref 150–450)
RBC: 4.78 x10E6/uL (ref 4.14–5.80)
RDW: 12.7 % (ref 11.6–15.4)
WBC: 6.8 x10E3/uL (ref 3.4–10.8)

## 2024-01-25 LAB — HEMOGLOBIN A1C
Est. average glucose Bld gHb Est-mCnc: 120 mg/dL
Hgb A1c MFr Bld: 5.8 % — ABNORMAL HIGH (ref 4.8–5.6)

## 2024-01-25 LAB — LIPID PANEL
Chol/HDL Ratio: 3.1 ratio (ref 0.0–5.0)
Cholesterol, Total: 119 mg/dL (ref 100–199)
HDL: 39 mg/dL — ABNORMAL LOW (ref >39)
LDL Chol Calc (NIH): 63 mg/dL (ref 0–99)
Triglycerides: 90 mg/dL (ref 0–149)
VLDL Cholesterol Cal: 17 mg/dL (ref 5–40)

## 2024-01-25 LAB — CMP14+EGFR
ALT: 16 IU/L (ref 0–44)
AST: 14 IU/L (ref 0–40)
Albumin: 4.3 g/dL (ref 3.8–4.9)
Alkaline Phosphatase: 49 IU/L (ref 44–121)
BUN/Creatinine Ratio: 17 (ref 9–20)
BUN: 15 mg/dL (ref 6–24)
Bilirubin Total: 0.4 mg/dL (ref 0.0–1.2)
CO2: 21 mmol/L (ref 20–29)
Calcium: 9.4 mg/dL (ref 8.7–10.2)
Chloride: 103 mmol/L (ref 96–106)
Creatinine, Ser: 0.9 mg/dL (ref 0.76–1.27)
Globulin, Total: 2.3 g/dL (ref 1.5–4.5)
Glucose: 92 mg/dL (ref 70–99)
Potassium: 4.2 mmol/L (ref 3.5–5.2)
Sodium: 142 mmol/L (ref 134–144)
Total Protein: 6.6 g/dL (ref 6.0–8.5)
eGFR: 98 mL/min/1.73 (ref >59)

## 2024-01-25 LAB — TSH+FREE T4
Free T4: 1.3 ng/dL (ref 0.82–1.77)
TSH: 1.67 u[IU]/mL (ref 0.450–4.500)

## 2024-01-25 LAB — VITAMIN D 25 HYDROXY (VIT D DEFICIENCY, FRACTURES): Vit D, 25-Hydroxy: 54.8 ng/mL (ref 30.0–100.0)

## 2024-01-25 NOTE — Progress Notes (Signed)
 Please inform the patient that his lab results indicate prediabetes. Recommend reducing intake of high-sugar foods and beverages and increasing physical activity. All other labs are within normal limits.

## 2024-01-26 DIAGNOSIS — Z419 Encounter for procedure for purposes other than remedying health state, unspecified: Secondary | ICD-10-CM | POA: Diagnosis not present

## 2024-01-31 ENCOUNTER — Ambulatory Visit (HOSPITAL_COMMUNITY)
Admission: RE | Admit: 2024-01-31 | Discharge: 2024-01-31 | Disposition: A | Discharge status: Home / Self Care | Source: Ambulatory Visit | Attending: Urology | Admitting: Urology

## 2024-01-31 DIAGNOSIS — N4342 Spermatocele of epididymis, multiple: Secondary | ICD-10-CM | POA: Diagnosis not present

## 2024-01-31 DIAGNOSIS — N503 Cyst of epididymis: Secondary | ICD-10-CM | POA: Diagnosis not present

## 2024-01-31 DIAGNOSIS — N433 Hydrocele, unspecified: Secondary | ICD-10-CM | POA: Diagnosis not present

## 2024-01-31 DIAGNOSIS — N4341 Spermatocele of epididymis, single: Secondary | ICD-10-CM | POA: Diagnosis not present

## 2024-02-03 ENCOUNTER — Telehealth: Payer: Self-pay | Admitting: Home Health

## 2024-02-03 ENCOUNTER — Other Ambulatory Visit: Payer: Self-pay | Admitting: Cardiology

## 2024-02-03 MED ORDER — AMLODIPINE BESYLATE 10 MG PO TABS: 10.0000 mg | ORAL_TABLET | Freq: Every day | ORAL | 1 refills | Status: DC

## 2024-02-03 NOTE — Telephone Encounter (Signed)
 Patient's wife Bari called after hours line, reporting patient had no refill of amlodipine  10mg . Script sent to [facility] pharmacy per patient's request. He reports no new issue since last visit 11/2023.

## 2024-02-05 ENCOUNTER — Emergency Department (HOSPITAL_COMMUNITY)

## 2024-02-05 ENCOUNTER — Ambulatory Visit (INDEPENDENT_AMBULATORY_CARE_PROVIDER_SITE_OTHER): Admitting: Clinical

## 2024-02-05 ENCOUNTER — Emergency Department (HOSPITAL_COMMUNITY)
Admission: EM | Admit: 2024-02-05 | Discharge: 2024-02-06 | Disposition: A | Discharge status: Home / Self Care | Attending: Emergency Medicine | Admitting: Emergency Medicine

## 2024-02-05 ENCOUNTER — Other Ambulatory Visit: Payer: Self-pay

## 2024-02-05 ENCOUNTER — Encounter (HOSPITAL_COMMUNITY): Payer: Self-pay | Admitting: Emergency Medicine

## 2024-02-05 DIAGNOSIS — E119 Type 2 diabetes mellitus without complications: Secondary | ICD-10-CM | POA: Insufficient documentation

## 2024-02-05 DIAGNOSIS — Z7901 Long term (current) use of anticoagulants: Secondary | ICD-10-CM | POA: Diagnosis not present

## 2024-02-05 DIAGNOSIS — R001 Bradycardia, unspecified: Secondary | ICD-10-CM | POA: Diagnosis not present

## 2024-02-05 DIAGNOSIS — J449 Chronic obstructive pulmonary disease, unspecified: Secondary | ICD-10-CM | POA: Insufficient documentation

## 2024-02-05 DIAGNOSIS — Z79899 Other long term (current) drug therapy: Secondary | ICD-10-CM | POA: Diagnosis not present

## 2024-02-05 DIAGNOSIS — F4310 Post-traumatic stress disorder, unspecified: Secondary | ICD-10-CM

## 2024-02-05 DIAGNOSIS — R079 Chest pain, unspecified: Secondary | ICD-10-CM | POA: Diagnosis not present

## 2024-02-05 DIAGNOSIS — R0789 Other chest pain: Secondary | ICD-10-CM | POA: Insufficient documentation

## 2024-02-05 DIAGNOSIS — Z7951 Long term (current) use of inhaled steroids: Secondary | ICD-10-CM | POA: Insufficient documentation

## 2024-02-05 DIAGNOSIS — F419 Anxiety disorder, unspecified: Secondary | ICD-10-CM

## 2024-02-05 DIAGNOSIS — F410 Panic disorder [episodic paroxysmal anxiety] without agoraphobia: Secondary | ICD-10-CM

## 2024-02-05 DIAGNOSIS — I10 Essential (primary) hypertension: Secondary | ICD-10-CM | POA: Diagnosis not present

## 2024-02-05 DIAGNOSIS — F331 Major depressive disorder, recurrent, moderate: Secondary | ICD-10-CM | POA: Diagnosis not present

## 2024-02-05 DIAGNOSIS — I2510 Atherosclerotic heart disease of native coronary artery without angina pectoris: Secondary | ICD-10-CM | POA: Diagnosis not present

## 2024-02-05 DIAGNOSIS — R918 Other nonspecific abnormal finding of lung field: Secondary | ICD-10-CM | POA: Diagnosis not present

## 2024-02-05 DIAGNOSIS — Z720 Tobacco use: Secondary | ICD-10-CM | POA: Insufficient documentation

## 2024-02-05 LAB — BASIC METABOLIC PANEL WITH GFR
Anion gap: 13 (ref 5–15)
BUN: 16 mg/dL (ref 6–20)
CO2: 24 mmol/L (ref 22–32)
Calcium: 8.8 mg/dL — ABNORMAL LOW (ref 8.9–10.3)
Chloride: 102 mmol/L (ref 98–111)
Creatinine, Ser: 0.91 mg/dL (ref 0.61–1.24)
GFR, Estimated: >60 mL/min (ref >60)
Glucose, Bld: 176 mg/dL — ABNORMAL HIGH (ref 70–99)
Potassium: 3.9 mmol/L (ref 3.5–5.1)
Sodium: 139 mmol/L (ref 135–145)

## 2024-02-05 LAB — CBC
HCT: 42.6 % (ref 39.0–52.0)
Hemoglobin: 14.9 g/dL (ref 13.0–17.0)
MCH: 32.5 pg (ref 26.0–34.0)
MCHC: 35 g/dL (ref 30.0–36.0)
MCV: 92.8 fL (ref 80.0–100.0)
Platelets: 174 K/uL (ref 150–400)
RBC: 4.59 MIL/uL (ref 4.22–5.81)
RDW: 12.3 % (ref 11.5–15.5)
WBC: 5.2 K/uL (ref 4.0–10.5)
nRBC: 0 % (ref 0.0–0.2)

## 2024-02-05 LAB — TROPONIN I (HIGH SENSITIVITY)
Troponin I (High Sensitivity): 2 ng/L (ref <18)
Troponin I (High Sensitivity): 3 ng/L (ref <18)

## 2024-02-05 LAB — MAGNESIUM: Magnesium: 2.1 mg/dL (ref 1.7–2.4)

## 2024-02-05 LAB — PROTIME-INR
INR: 1.6 — ABNORMAL HIGH (ref 0.8–1.2)
Prothrombin Time: 19.7 s — ABNORMAL HIGH (ref 11.4–15.2)

## 2024-02-05 MED ORDER — ALUM & MAG HYDROXIDE-SIMETH 200-200-20 MG/5ML PO SUSP
30.0000 mL | Freq: Once | ORAL | Status: AC
  Administered 2024-02-05: 30 mL via ORAL
  Filled 2024-02-05: qty 30

## 2024-02-05 MED ORDER — SODIUM CHLORIDE 0.9 % IV BOLUS
1000.0000 mL | Freq: Once | INTRAVENOUS | Status: AC
  Administered 2024-02-05: 1000 mL via INTRAVENOUS

## 2024-02-05 MED ORDER — METHYLPREDNISOLONE SODIUM SUCC 125 MG IJ SOLR
125.0000 mg | Freq: Once | INTRAMUSCULAR | Status: AC
  Administered 2024-02-05: 125 mg via INTRAVENOUS
  Filled 2024-02-05: qty 2

## 2024-02-05 MED ORDER — KETOROLAC TROMETHAMINE 30 MG/ML IJ SOLN
30.0000 mg | Freq: Once | INTRAMUSCULAR | Status: AC
  Administered 2024-02-05: 30 mg via INTRAVENOUS
  Filled 2024-02-05: qty 1

## 2024-02-05 MED ORDER — IPRATROPIUM-ALBUTEROL 0.5-2.5 (3) MG/3ML IN SOLN
3.0000 mL | Freq: Once | RESPIRATORY_TRACT | Status: AC
  Administered 2024-02-05: 3 mL via RESPIRATORY_TRACT

## 2024-02-05 NOTE — Progress Notes (Signed)
 Virtual Visit via Video Note   I connected with [name] on 02/05/24 at  2:00 PM EDT by a video enabled telemedicine application and verified that I am speaking with the correct person using two identifiers.   Location: Patient: home Provider: office   I discussed the limitations of evaluation and management by telemedicine and the availability of in person appointments. The patient expressed understanding and agreed to proceed.     THERAPIST PROGRESS NOTE     Session Time: 2:00 PM-2:55 PM   Participation Level: Active   Behavioral Response: Casual and Alert,Anxious   Type of Therapy: Individual Therapy   Treatment Goals addressed: Mood and Anxiety   Interventions: CBT   Summary: Richard [name] is a 58 y.o. male who presents with panic disorder/depression with anxiety/and PTSD . The OPT therapist worked with the patient for his OPT treatment. The OPT therapist utilized Motivational Interviewing to assist in creating therapeutic repore. The patient in the session was engaged and work in collaboration giving feedback about his triggers and symptoms over the past few weeks. The patient spoke about his external stressor of trying to help his neighbor out feeding them and letting them use electricity due to the neighbors financial situation. The patient spoke about his awareness with his cardiac  condition of not being out in the heat for any extended period of time and staying hydrated. The OPT therapist utilized Cognitive Behavioral Therapy through cognitive restructuring as well as worked with the patient on coping strategies to assist in management of symptoms as well as reviewed sleep, eating habits, and general health. The patient continues to work on acceptance of limited mobility based on health conditions and spoke about the impact of his physical health on his functioning. The OPT therapist worked with the patient on staying active even if this is inside the home. The OPT  therapist worked with the patient on not allowing the help he is giving others create a hardship for himself and ensuring he sets boundaries when needed. The OPT therapist overviewed upcoming health appointments listed in the patients MyChart.   Suicidal/Homicidal: Nowithout intent/plan   Therapist Response:The OPT therapist worked with the patient for the patients scheduled session. The patient was engaged in his session and gave feedback in relation to triggers, symptoms, and behavior responses over the past few weeks.The patient spoke about being precautious during the Summer months with extreme heat in not getting out in the heat for extended periods of time due to the added risk from his heart condition. The OPT therapist worked with the patient utilizing an in session Cognitive Behavioral Therapy exercise. The patient was responsive in the session and verbalized,  I have been trying to stay in out of the heat and its difficult cause I can not get out as much due to the heat.The OPT therapist worked with the patient providing ongoing psychotherapy/education and coping skills review. The patient overviewed his physical health and ongoing treatment of Alpha-Gal . The patient spoke about utilizing his support network of some of the neighbors who live near him.The OPT therapist provided psycho-education throughout the session. The patient spoke about efforts to make good choices and not put his health at risk by pushing past his limites . The patient noted ,   I give myself no more than 15 minutes out in the heat and that's all I can stand and I do not try to go passed that  The OPT therapist will continue treatment work with  the patient in his next scheduled session   Plan: Return again in 2/3 weeks.   Diagnosis:      Axis I: PTSD/Panic Disorder/ Depression with anxiety  Axis II: No diagnosis      Collaboration of Care: Overview of the patients involvement in the Med Management program with Dr.  Okey.   Patient/Guardian was advised Release of Information must be obtained prior to any record release in order to collaborate their care with an outside provider. Patient/Guardian was advised if they have not already done so to contact the registration department to sign all necessary forms in order for us  to release information regarding their care.    Consent: Patient/Guardian gives verbal consent for treatment and assignment of benefits for services provided during this visit. Patient/Guardian expressed understanding and agreed to proceed.    I discussed the assessment and treatment plan with the patient. The patient was provided an opportunity to ask questions and all were answered. The patient agreed with the plan and demonstrated an understanding of the instructions.   The patient was advised to call back or seek an in-person evaluation if the symptoms worsen or if the condition fails to improve as anticipated.   I provided 55 minutes of non-face-to-face time during this encounter.   Richard ONEIDA Pepper, LCSW    02/05/2024

## 2024-02-05 NOTE — ED Provider Notes (Signed)
 [facility] AT [facility] Provider Note   CSN: [id number] Arrival date & time: 02/05/24  2101     Patient presents with: Chest Pain   Richard [name] is a 58 y.o. male.   Pt is a 58 yo male with pmhx significant for COPD, tobacco abuse, GERD, HLD, Depression, Colitis, gastric ulcers, asthma, DM2, paroxysmal afib (on Xarelto ), CAD, HTN, and pulmonary nodules.  Pt has had central cp for the past 4 hours.  His HR also dropped to 39 and he felt lightheaded and dizzy.  He did use his inhaler which helped a little bit.  He denies any n/v.       Prior to Admission medications   Medication Sig Start Date End Date Taking? Authorizing Provider  acetaminophen  (TYLENOL ) 325 MG tablet Take 650 mg by mouth every 6 (six) hours as needed for moderate pain.   Yes [provider]  albuterol  (VENTOLIN  HFA) 108 (90 Base) MCG/ACT inhaler INHALE 2 PUFFS BY MOUTH EVERY 6 HOURS AS NEEDED FOR COUGHING, WHEEZING, OR SHORTNESS OF BREATH 06/19/22  Yes Comer Kirsch, PA-C  ALPRAZolam  (XANAX ) 0.5 MG tablet Take one twice a day and two at bedtime Patient taking differently: Take 0.5-1 mg by mouth 2 (two) times daily. Take one twice a day and two at bedtime 11/12/23  Yes Okey Barnie SAUNDERS, MD  amLODipine  (NORVASC ) 10 MG tablet Take 1 tablet (10 mg total) by mouth daily. Patient taking differently: Take 10 mg by mouth every evening. 02/03/24  Yes Zhao, Xika, NP  azelastine  (ASTELIN ) 0.1 % nasal spray Place 2 sprays into both nostrils 2 (two) times daily as needed. Use in each nostril as directed 09/24/23  Yes Tobie Arleta SQUIBB, MD  budesonide -formoterol  (SYMBICORT ) 160-4.5 MCG/ACT inhaler Take 2 puffs first thing in am and then another 2 puffs about 12 hours later. Patient taking differently: Inhale 2 puffs into the lungs in the morning and at bedtime. Take 2 puffs first thing in am and then another 2 puffs about 12 hours later. 01/14/24  Yes Darlean Ozell NOVAK, MD  dronedarone  (MULTAQ )  400 MG tablet Take 1 tablet (400 mg total) by mouth 2 (two) times daily with a meal. 05/18/23 05/18/24 Yes Waddell Danelle ORN, MD  EPINEPHrine  0.3 mg/0.3 mL IJ SOAJ injection Inject 0.3 mg into the muscle as needed for anaphylaxis. 09/24/23  Yes Tobie Arleta SQUIBB, MD  esomeprazole  (NEXIUM ) 40 MG capsule Take 1 capsule (40 mg total) by mouth 2 (two) times daily before a meal. 07/19/23  Yes Shirlean Therisa ORN, NP  fluticasone  (FLONASE ) 50 MCG/ACT nasal spray Place 2 sprays into both nostrils daily. 09/24/23  Yes Patel, Arleta SQUIBB, MD  ipratropium (ATROVENT) 0.06 % nasal spray Place 2 sprays into both nostrils 3 (three) times daily. 01/27/24  Yes [provider]  isosorbide  mononitrate (IMDUR ) 30 MG 24 hr tablet Take 1 tablet (30 mg total) by mouth daily. Patient taking differently: Take 30 mg by mouth daily as needed (severe palpitations due to high HR). 05/10/23  Yes Strader, Laymon HERO, PA-C  KLOR-CON  M20 20 MEQ tablet TAKE 1 TABLET BY MOUTH EVERY DAY 07/19/23  Yes Dunn, Dayna N, PA-C  lidocaine  (XYLOCAINE ) 2 % solution Use as directed 15 mLs in the mouth or throat every 3 (three) hours as needed for mouth pain. 01/15/24  Yes Chandra Harlene LABOR, NP  metoprolol  succinate (TOPROL -XL) 25 MG 24 hr tablet Take 0.5 tablets (12.5 mg total) by mouth daily. Can take an extra 12.5mg   as needed for palpitations. 11/29/23  Yes Strader, Laymon HERO, PA-C  nitroGLYCERIN  (NITROSTAT ) 0.4 MG SL tablet Place 1 tablet (0.4 mg total) under the tongue every 5 (five) minutes x 3 doses as needed for chest pain (if no relief after 3rd dose, proceed to the ED for an evaluation). 07/26/22  Yes Waddell Danelle ORN, MD  rivaroxaban  (XARELTO ) 20 MG TABS tablet TAKE 1 TABLET BY MOUTH ONCE DAILY WITH SUPPER 10/08/23  Yes Debera Jayson MATSU, MD  simvastatin  (ZOCOR ) 20 MG tablet Take 1 tablet by mouth once daily 06/22/23  Yes Waddell Danelle ORN, MD  vitamin B-12 (CYANOCOBALAMIN ) 50 MCG tablet Take 50 mcg by mouth daily.   Yes [provider]  VITAMIN D   PO Take 1 tablet by mouth daily.   Yes [provider]    Allergies: Alpha-gal, Dexilant  [dexlansoprazole ], Mushroom ext cmplx(shiitake-reishi-mait), Penicillins, and Doxycycline    Review of Systems  Respiratory:  Positive for shortness of breath.   Cardiovascular:  Positive for chest pain.  All other systems reviewed and are negative.   Updated Vital Signs BP 121/66   Pulse 62   Temp 98.2 F (36.8 C) (Oral)   Resp 14   Ht 6' (1.829 m)   Wt 77.6 kg   SpO2 96%   BMI 23.19 kg/m   Physical Exam Vitals and nursing note reviewed.  Constitutional:      Appearance: He is well-developed.  HENT:     Head: Normocephalic and atraumatic.  Eyes:     Extraocular Movements: Extraocular movements intact.     Pupils: Pupils are equal, round, and reactive to light.  Cardiovascular:     Rate and Rhythm: Normal rate and regular rhythm.     Heart sounds: Normal heart sounds.  Pulmonary:     Effort: Pulmonary effort is normal.     Breath sounds: Wheezing present.  Abdominal:     General: Bowel sounds are normal.     Palpations: Abdomen is soft.  Musculoskeletal:        General: Normal range of motion.     Cervical back: Normal range of motion and neck supple.  Skin:    General: Skin is warm.     Capillary Refill: Capillary refill takes less than 2 seconds.  Neurological:     General: No focal deficit present.     Mental Status: He is alert and oriented to person, place, and time.  Psychiatric:        Mood and Affect: Mood normal.        Behavior: Behavior normal.     (all labs ordered are listed, but only abnormal results are displayed) Labs Reviewed  BASIC METABOLIC PANEL WITH GFR - Abnormal; Notable for the following components:      Result Value   Glucose, Bld 176 (*)    Calcium  8.8 (*)    All other components within normal limits  PROTIME-INR - Abnormal; Notable for the following components:   Prothrombin Time 19.7 (*)    INR 1.6 (*)    All other components  within normal limits  CBC  MAGNESIUM   TROPONIN I (HIGH SENSITIVITY)  TROPONIN I (HIGH SENSITIVITY)    EKG: EKG Interpretation Date/Time:  Tuesday February 05 2024 21:13:18 EDT Ventricular Rate:  58 PR Interval:  180 QRS Duration:  107 QT Interval:  444 QTC Calculation: 437 R Axis:   82  Text Interpretation: Sinus rhythm No significant change since last tracing Confirmed by Dean Clarity [phone]) on 02/05/2024 9:27:17 PM  Radiology: DG Chest 2 View Result Date: 02/05/2024 CLINICAL DATA:  Chest pain. EXAM: CHEST - 2 VIEW COMPARISON:  11/20/2023, PET CT 01/17/2024 FINDINGS: The cardiomediastinal contours are normal. The left lower lobe nodule on prior imaging is not well-defined by radiograph. Chronic hyperinflation and bronchial thickening. Pulmonary vasculature is normal. No consolidation, pleural effusion, or pneumothorax. No acute osseous abnormalities are seen. IMPRESSION: Chronic hyperinflation and bronchial thickening.  No acute findings. Electronically Signed   By: Andrea Gasman M.D.   On: 02/05/2024 21:52     Procedures   Medications Ordered in the ED  sodium chloride  0.9 % bolus 1,000 mL (0 mLs Intravenous Stopped 02/05/24 2302)  ketorolac  (TORADOL ) 30 MG/ML injection 30 mg (30 mg Intravenous Given 02/05/24 2140)  methylPREDNISolone  sodium succinate (SOLU-MEDROL ) 125 mg/2 mL injection 125 mg (125 mg Intravenous Given 02/05/24 2141)  ipratropium-albuterol  (DUONEB) 0.5-2.5 (3) MG/3ML nebulizer solution 3 mL (3 mLs Nebulization Given 02/05/24 2202)  alum & mag hydroxide-simeth (MAALOX/MYLANTA) 200-200-20 MG/5ML suspension 30 mL (30 mLs Oral Given 02/05/24 2140)                                    Medical Decision Making Amount and/or Complexity of Data Reviewed Labs: ordered. Radiology: ordered.  Risk OTC drugs. Prescription drug management.   This patient presents to the ED for concern of cp, this involves an extensive number of treatment options, and is a complaint that  carries with it a high risk of complications and morbidity.  The differential diagnosis includes cardiac, pulm, gi   Co morbidities that complicate the patient evaluation  COPD, tobacco abuse, GERD, HLD, Depression, Colitis, gastric ulcers, asthma, DM2, paroxysmal afib (on Xarelto ), CAD, HTN, and pulmonary nodules   Additional history obtained:  Additional history obtained from epic chart review External records from outside source obtained and reviewed including wife   Lab Tests:  I Ordered, and personally interpreted labs.  The pertinent results include:  cbc nl, bmp nl, trop, inr 1.6   Imaging Studies ordered:  I ordered imaging studies including cxr  I independently visualized and interpreted imaging which showed Chronic hyperinflation and bronchial thickening.  No acute findings.  I agree with the radiologist interpretation   Cardiac Monitoring:  The patient was maintained on a cardiac monitor.  I personally viewed and interpreted the cardiac monitored which showed an underlying rhythm of: sb   Medicines ordered and prescription drug management:  I ordered medication including duoneb/solumedrol/gi cocktail/toradol   for sx  Reevaluation of the patient after these medicines showed that the patient improved I have reviewed the patients home medicines and have made adjustments as needed   Test Considered:  ct   Problem List / ED Course:  CP:  atypical.  It is radiating up his esophagus, so it is likely his GERD.  He also had some wheezing which improved with a neb and solumedrol.  Cardiac eval neg. Bradycardia:  pt to decrease metoprolol  dose.  F/u with cards.   Reevaluation:  After the interventions noted above, I reevaluated the patient and found that they have :improved   Social Determinants of Health:  Lives at home   Dispostion:  After consideration of the diagnostic results and the patients response to treatment, I feel that the patent would benefit  from discharge with outpatient f/u.       Final diagnoses:  Bradycardia  Atypical chest pain    ED Discharge Orders  None          Dean Clarity, MD 02/05/24 2329

## 2024-02-05 NOTE — ED Provider Notes (Signed)
  Provider Note MRN:  [id number]  Arrival date & time: 02/06/24    ED Course and Medical Decision Making  Assumed care of patient at sign-out or upon transfer.  Chest pain favored noncardiac such as GERD.  Reassuring workup awaiting second troponin, anticipating discharge.  Procedures  Final Clinical Impressions(s) / ED Diagnoses     ICD-10-CM   1. Bradycardia  R00.1     2. Atypical chest pain  R07.89       ED Discharge Orders     None         Discharge Instructions      Decrease your metoprolol  dose to 6.25 mg daily.    Ozell HERO. Theadore, MD [facility] Emergency [facility] [email or web address]    Theadore Ozell HERO, MD 02/06/24 [phone]

## 2024-02-05 NOTE — ED Triage Notes (Signed)
 Pt via POV c/o central CP x 4 hours with SOB. Non-radiating, constant. Significant cardiac and pulmonary history. Pt has been lightheaded and sweating also. HR 39bpm at home.

## 2024-02-05 NOTE — Discharge Instructions (Addendum)
 Decrease your metoprolol  dose to 6.25 mg daily.

## 2024-02-06 ENCOUNTER — Telehealth: Payer: Self-pay | Admitting: Cardiology

## 2024-02-06 ENCOUNTER — Encounter: Payer: Self-pay | Admitting: Urology

## 2024-02-06 ENCOUNTER — Ambulatory Visit (INDEPENDENT_AMBULATORY_CARE_PROVIDER_SITE_OTHER): Admitting: Urology

## 2024-02-06 ENCOUNTER — Ambulatory Visit: Admitting: Acute Care

## 2024-02-06 VITALS — BP 135/73 | HR 67

## 2024-02-06 DIAGNOSIS — N5089 Other specified disorders of the male genital organs: Secondary | ICD-10-CM

## 2024-02-06 DIAGNOSIS — R350 Frequency of micturition: Secondary | ICD-10-CM

## 2024-02-06 DIAGNOSIS — N5201 Erectile dysfunction due to arterial insufficiency: Secondary | ICD-10-CM

## 2024-02-06 DIAGNOSIS — N529 Male erectile dysfunction, unspecified: Secondary | ICD-10-CM | POA: Diagnosis not present

## 2024-02-06 MED ORDER — METOPROLOL SUCCINATE ER 25 MG PO TB24: 12.5000 mg | ORAL_TABLET | Freq: Every day | ORAL | 3 refills | Status: DC | PRN

## 2024-02-06 NOTE — Telephone Encounter (Signed)
 Pt and wife notified of Dr. Madalyn response.

## 2024-02-06 NOTE — Telephone Encounter (Signed)
 Pts wife would like a c/b to discuss the pts hospital visit.

## 2024-02-06 NOTE — Progress Notes (Signed)
 02/06/2024 10:54 AM   Richard [name] July 28, 1965 [id number]  Referring provider: Zarwolo, Gloria, FNP [address] [place],  [place] [address]  Followup left epididymal/testis mass   HPI: Mr Kaczmarczyk is a a 58yo here for followup for for a left epididymal mass and new urinary urgency. Scrotal US  7/17 shows stable 7mm avascular left epididymal lesion. He has worsening urinary urgency which is intermittent. IPSS 14 QOL 2 on no BPH therapy.  He has mild left testicular pain with palpation of the area. For the past 2 years he has noted difficulty getting an erection. He has hx of tobacco usage. He was recently seen in the Er for bradycardia. He was given rx for Imdur  prn for palpitations.    PMH: Past Medical History:  Diagnosis Date   Arthritis    Asthma    CAD (coronary artery disease)    a. Cardiac cath 07/2015 showed 65% distal Cx, 20% mid-distal LAD, 20% prox-distal RCA, EF 60%, EDP .   Colitis 1990   COPD (chronic obstructive pulmonary disease) (HCC)    Depression    Dysrhythmia    Essential hypertension    Fatty liver    Gastric ulcer 2003; 2012   2003: + esophagitis; negative H.pylori serology  2012: Dr. Harvey, mild gastritis, Bravo PH probe placement, negative H.pylori   GERD (gastroesophageal reflux disease)    Hepatic steatosis    History of hiatal hernia    Hyperlipemia    Overweight    Panic attacks    Paroxysmal atrial fibrillation (HCC)    Pulmonary nodules    Stroke (HCC)    TIA (transient ischemic attack)    Type II diabetes mellitus (HCC)     Surgical History: Past Surgical History:  Procedure Laterality Date   BALLOON DILATION N/A 07/25/2021   Procedure: BALLOON DILATION;  Surgeon: Cindie Carlin POUR, DO;  [facility];  Service: Endoscopy;  Laterality: N/A;   BIOPSY  07/25/2021   Procedure: BIOPSY;  Surgeon: Cindie Carlin POUR, DO;  [facility]: [facility];  Service: Endoscopy;;   BIOPSY  12/04/2022   Procedure: BIOPSY;  Surgeon: Cindie Carlin POUR, DO;  [facility]: [facility];  Service: Endoscopy;;   BRAVO PH STUDY  05/03/2011   DOQ:Fpoi gastritis/normal esophagus and duodenum   CARDIAC CATHETERIZATION  1990s X 1; 2005; 08/12/2015   CARDIAC CATHETERIZATION N/A 08/12/2015   Procedure: Left Heart Cath and Coronary Angiography;  Surgeon: Victory LELON Sharps, MD; LAD 20%, CFX 65%, RCA 20%, EF 60%    COLONOSCOPY  1990   COLONOSCOPY WITH PROPOFOL  N/A 11/21/2016   Dr. Harvey: non-thrombosed external hemorrhoids, one 6 mm polyp (polypoid lesion), internal hemorrhoids. TI Normal. 10 years screening   ESOPHAGOGASTRODUODENOSCOPY  05/03/2011   DOQ:fpoi gastritis   ESOPHAGOGASTRODUODENOSCOPY (EGD) WITH PROPOFOL  N/A 07/25/2021   Procedure: ESOPHAGOGASTRODUODENOSCOPY (EGD) WITH PROPOFOL ;  Surgeon: Cindie Carlin POUR, DO;  [facility];  Service: Endoscopy;  Laterality: N/A;  1:30pm   ESOPHAGOGASTRODUODENOSCOPY (EGD) WITH PROPOFOL  N/A 12/04/2022   Procedure: ESOPHAGOGASTRODUODENOSCOPY (EGD) WITH PROPOFOL ;  Surgeon: Cindie Carlin POUR, DO;  Location: [facility];  Service: Endoscopy;  Laterality: N/A;  8:00AM;ASA 3   NECK MASS EXCISION Right    done in dr's office; behind right ear/side of ncek   POLYPECTOMY  11/21/2016   Procedure: POLYPECTOMY;  Surgeon: Harvey Margo CROME, MD;  Location: [facility];  Service: Endoscopy;;  descending colon polyp   SHOULDER ARTHROSCOPY W/ ROTATOR CUFF REPAIR Right 2006  acromioclavicular joint arthrosis    Home Medications:  Allergies as of 02/06/2024       Reactions   Alpha-gal Anaphylaxis   Dexilant  [dexlansoprazole ] Anaphylaxis   Mushroom Ext Cmplx(shiitake-reishi-mait) Anaphylaxis   Rapid heart rate.   Penicillins Anaphylaxis   Immediate rash, facial/tongue/throat swelling, SOB or lightheadedness with hypotension   Doxycycline Nausea And Vomiting           Medication List        Accurate as of February 06, 2024 10:54 AM. If you have any questions, ask your nurse or doctor.           albuterol  108 (90 Base) MCG/ACT inhaler Commonly known as: VENTOLIN  HFA INHALE 2 PUFFS BY MOUTH EVERY 6 HOURS AS NEEDED FOR COUGHING, WHEEZING, OR SHORTNESS OF BREATH   ALPRAZolam  0.5 MG tablet Commonly known as: Xanax  Take one twice a day and two at bedtime What changed:  how much to take how to take this when to take this   amLODipine  10 MG tablet Commonly known as: NORVASC  Take 1 tablet (10 mg total) by mouth daily. What changed: when to take this   azelastine  0.1 % nasal spray Commonly known as: ASTELIN  Place 2 sprays into both nostrils 2 (two) times daily as needed. Use in each nostril as directed   budesonide -formoterol  160-4.5 MCG/ACT inhaler Commonly known as: Symbicort  Take 2 puffs first thing in am and then another 2 puffs about 12 hours later. What changed:  how much to take how to take this when to take this   EPINEPHrine  0.3 mg/0.3 mL Soaj injection Commonly known as: EPI-PEN Inject 0.3 mg into the muscle as needed for anaphylaxis.   esomeprazole  40 MG capsule Commonly known as: NexIUM  Take 1 capsule (40 mg total) by mouth 2 (two) times daily before a meal.   fluticasone  50 MCG/ACT nasal spray Commonly known as: FLONASE  Place 2 sprays into both nostrils daily.   ipratropium 0.06 % nasal spray Commonly known as: ATROVENT Place 2 sprays into both nostrils 3 (three) times daily.   isosorbide  mononitrate 30 MG 24 hr tablet Commonly known as: IMDUR  Take 1 tablet (30 mg total) by mouth daily. What changed:  when to take this reasons to take this   Klor-Con  M20 20 MEQ tablet Generic drug: potassium chloride  SA TAKE 1 TABLET BY MOUTH EVERY DAY   lidocaine  2 % solution Commonly known as: XYLOCAINE  Use as directed 15 mLs in the mouth or throat every 3 (three) hours as needed for mouth pain.   metoprolol  succinate 25 MG 24 hr tablet Commonly known as: TOPROL -XL Take 0.5 tablets (12.5 mg total) by mouth daily. Can take an extra 12.5mg  as needed for  palpitations.   Multaq  400 MG tablet Generic drug: dronedarone  Take 1 tablet (400 mg total) by mouth 2 (two) times daily with a meal.   nitroGLYCERIN  0.4 MG SL tablet Commonly known as: NITROSTAT  Place 1 tablet (0.4 mg total) under the tongue every 5 (five) minutes x 3 doses as needed for chest pain (if no relief after 3rd dose, proceed to the ED for an evaluation).   simvastatin  20 MG tablet Commonly known as: ZOCOR  Take 1 tablet by mouth once daily   Tylenol  325 MG tablet Generic drug: acetaminophen  Take 650 mg by mouth every 6 (six) hours as needed for moderate pain.   vitamin B-12 50 MCG tablet Commonly known as: CYANOCOBALAMIN  Take 50 mcg by mouth daily.   VITAMIN D  PO Take 1 tablet by mouth  daily.   Xarelto  20 MG Tabs tablet Generic drug: rivaroxaban  TAKE 1 TABLET BY MOUTH ONCE DAILY WITH SUPPER        Allergies:  Allergies  Allergen Reactions   Alpha-Gal Anaphylaxis   Dexilant  [Dexlansoprazole ] Anaphylaxis   Mushroom Ext Cmplx(Shiitake-Reishi-Mait) Anaphylaxis    Rapid heart rate.   Penicillins Anaphylaxis    Immediate rash, facial/tongue/throat swelling, SOB or lightheadedness with hypotension   Doxycycline Nausea And Vomiting         Family History: Family History  Problem Relation Age of Onset   Lung cancer Mother    Alcohol abuse Mother    Heart attack Father 51   Diabetes Father    Alcohol abuse Father    Asthma Sister    Anxiety disorder Sister    Depression Sister    Anxiety disorder Sister    Hypertension Brother    Hypertension Brother    Heart attack Brother 58   Diabetes Brother    Hypertension Brother    Seizures Brother    Dementia Paternal Uncle    ADD / ADHD Daughter    Dementia Cousin    Colon cancer Neg Hx    Drug abuse Neg Hx    Bipolar disorder Neg Hx    OCD Neg Hx    Paranoid behavior Neg Hx    Schizophrenia Neg Hx    Sexual abuse Neg Hx    Physical abuse Neg Hx     Social History:  reports that he has been  smoking cigarettes. He started smoking about 41 years ago. He has a 40 pack-year smoking history. He has never used smokeless tobacco. He reports that he does not currently use alcohol. He reports that he does not use drugs.  ROS: All other review of systems were reviewed and are negative except what is noted above in HPI  Physical Exam: BP 135/73   Pulse 67   Constitutional:  Alert and oriented, No acute distress. HEENT: [place] AT, moist mucus membranes.  Trachea midline, no masses. Cardiovascular: No clubbing, cyanosis, or edema. Respiratory: Normal respiratory effort, no increased work of breathing. GI: Abdomen is soft, nontender, nondistended, no abdominal masses GU: No CVA tenderness.  Lymph: No cervical or inguinal lymphadenopathy. Skin: No rashes, bruises or suspicious lesions. Neurologic: Grossly intact, no focal deficits, moving all 4 extremities. Psychiatric: Normal mood and affect.  Laboratory Data: Lab Results  Component Value Date   WBC 5.2 02/05/2024   HGB 14.9 02/05/2024   HCT 42.6 02/05/2024   MCV 92.8 02/05/2024   PLT 174 02/05/2024    Lab Results  Component Value Date   CREATININE 0.91 02/05/2024    No results found for: PSA  Lab Results  Component Value Date   TESTOSTERONE  579 08/16/2023    Lab Results  Component Value Date   HGBA1C 5.8 (H) 01/24/2024    Urinalysis    Component Value Date/Time   COLORURINE YELLOW 08/03/2021 0127   APPEARANCEUR Clear 10/04/2023 1404   LABSPEC 1.015 08/03/2021 0127   PHURINE 5.5 08/03/2021 0127   GLUCOSEU Negative 10/04/2023 1404   HGBUR NEGATIVE 08/03/2021 0127   BILIRUBINUR Negative 10/04/2023 1404   KETONESUR NEGATIVE 08/03/2021 0127   PROTEINUR Trace 10/04/2023 1404   PROTEINUR NEGATIVE 08/03/2021 0127   UROBILINOGEN 0.2 10/14/2012 1800   NITRITE Negative 10/04/2023 1404   NITRITE NEGATIVE 08/03/2021 0127   LEUKOCYTESUR Negative 10/04/2023 1404   LEUKOCYTESUR NEGATIVE 08/03/2021 0127    Lab Results   Component Value Date  LABMICR Comment 10/04/2023   WBCUA None seen 08/09/2023   LABEPIT 0-10 08/09/2023   MUCUS Present (A) 08/09/2023   BACTERIA None seen 08/09/2023    Pertinent Imaging: Scrotal US  01/31/2024: Images reviewed and discussed with the patient  No results found for this or any previous visit.  No results found for this or any previous visit.  No results found for this or any previous visit.  No results found for this or any previous visit.  No results found for this or any previous visit.  No results found for this or any previous visit.  No results found for this or any previous visit.  No results found for this or any previous visit.   Assessment & Plan:    1. Testicular mass (Primary) -followup 1 year with scrotal US   2. Urinary frequency -patient defers therapy at this time  3. Erectile dysfunction -Patient is likely not a candidate for PDE5 therapy but we will confirm this with his cardiologist. He was offered a VED but we will await recs from cardiology   No follow-ups on file.  Belvie Clara, MD  [facility] [place]

## 2024-02-06 NOTE — Telephone Encounter (Signed)
 Pt seen in the ER last night for bradycardia. He was told to decrease Metoprolol  to 6.25 mg. May we send in a new prescription?

## 2024-02-07 ENCOUNTER — Ambulatory Visit: Payer: Self-pay

## 2024-02-08 ENCOUNTER — Ambulatory Visit: Admitting: Acute Care

## 2024-02-11 ENCOUNTER — Encounter (HOSPITAL_COMMUNITY): Payer: Self-pay | Admitting: Psychiatry

## 2024-02-11 ENCOUNTER — Telehealth (INDEPENDENT_AMBULATORY_CARE_PROVIDER_SITE_OTHER): Admitting: Psychiatry

## 2024-02-11 DIAGNOSIS — F4001 Agoraphobia with panic disorder: Secondary | ICD-10-CM

## 2024-02-11 MED ORDER — ALPRAZOLAM 0.5 MG PO TABS: ORAL_TABLET | ORAL | 2 refills | Status: DC

## 2024-02-11 NOTE — Progress Notes (Signed)
 Virtual Visit via Video Note  I connected with Richard [name] on 02/11/24 at 11:00 AM EDT by a video enabled telemedicine application and verified that I am speaking with the correct person using two identifiers.  Location: Patient: home Provider: office   I discussed the limitations of evaluation and management by telemedicine and the availability of in person appointments. The patient expressed understanding and agreed to proceed.      I discussed the assessment and treatment plan with the patient. The patient was provided an opportunity to ask questions and all were answered. The patient agreed with the plan and demonstrated an understanding of the instructions.   The patient was advised to call back or seek an in-person evaluation if the symptoms worsen or if the condition fails to improve as anticipated.  I provided 20 minutes of non-face-to-face time during this encounter.   Barnie Gull, MD  [facility] MD/PA/NP OP Progress Note  02/11/2024 11:19 AM Richard [name]  MRN:  [id number]  Chief Complaint:  Chief Complaint  Patient presents with   Depression   Anxiety   Follow-up   HPI: This patient is a 58 year old white male lives with his wife in [place].  He is on unemployment and also gets SSI disability.  The patient returns for follow-up after 3 months regarding his anxiety and panic attacks.  He states that he is having difficulty dealing with the intense heat lately.  He does not Feel comfortable going out anywhere.  He worries that if his car breaks down and he has to stand out in the heat he will have a severe reaction then perhaps even diet.  He states that he cannot breathe and does not feel like he gets enough air when it is hot.  Consequently he and his wife have been staying home most of the time.  They do get out to the grocery store.  He is looking forward to fall when he can get out more.  He still having trouble with his breathing as well as bradycardia brought on  by his the beta-blocker that he takes.  He recently was seen in the ED for this and his beta-blocker was cut in half.  He denies severe depression although he is frustrated with having to stay home all the time.  He does state that the Xanax  continues to help his anxiety and sleep.  He denies any thoughts of self-harm or suicide. Visit Diagnosis:    ICD-10-CM   1. 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 Cardiac cath 07/2015 showed 65% distal Cx, 20% mid-distal LAD, 20% prox-distal RCA, EF 60%, EDP .   Colitis 1990   COPD (chronic obstructive pulmonary disease) (HCC)    Depression    Dysrhythmia    Essential hypertension    Fatty liver    Gastric ulcer 2003; 2012   2003: + esophagitis; negative H.pylori serology  2012: Dr. Harvey, mild gastritis, Bravo PH probe placement, negative H.pylori   GERD (gastroesophageal reflux disease)    Hepatic steatosis    History of hiatal hernia    Hyperlipemia    Overweight    Panic attacks    Paroxysmal atrial fibrillation (HCC)    Pulmonary nodules    Stroke (HCC)  TIA (transient ischemic attack)    Type II diabetes mellitus (HCC)     Past Surgical History:  Procedure Laterality Date   BALLOON DILATION N/A 07/25/2021   Procedure: BALLOON DILATION;  Surgeon: Cindie Carlin POUR, DO;  [facility];  Service: Endoscopy;  Laterality: N/A;   BIOPSY  07/25/2021   Procedure: BIOPSY;  Surgeon: Cindie Carlin POUR, DO;  [facility];  Service: Endoscopy;;   BIOPSY  12/04/2022   Procedure: BIOPSY;  Surgeon: Cindie Carlin POUR, DO;  [facility];  Service: Endoscopy;;   BRAVO PH STUDY  05/03/2011   DOQ:Fpoi gastritis/normal esophagus and duodenum   CARDIAC CATHETERIZATION  1990s X 1; 2005; 08/12/2015   CARDIAC CATHETERIZATION N/A  08/12/2015   Procedure: Left Heart Cath and Coronary Angiography;  Surgeon: Victory LELON Sharps, MD; LAD 20%, CFX 65%, RCA 20%, EF 60%    COLONOSCOPY  1990   COLONOSCOPY WITH PROPOFOL  N/A 11/21/2016   Dr. Harvey: non-thrombosed external hemorrhoids, one 6 mm polyp (polypoid lesion), internal hemorrhoids. TI Normal. 10 years screening   ESOPHAGOGASTRODUODENOSCOPY  05/03/2011   DOQ:fpoi gastritis   ESOPHAGOGASTRODUODENOSCOPY (EGD) WITH PROPOFOL  N/A 07/25/2021   Procedure: ESOPHAGOGASTRODUODENOSCOPY (EGD) WITH PROPOFOL ;  Surgeon: Cindie Carlin POUR, DO;  [facility];  Service: Endoscopy;  Laterality: N/A;  1:30pm   ESOPHAGOGASTRODUODENOSCOPY (EGD) WITH PROPOFOL  N/A 12/04/2022   Procedure: ESOPHAGOGASTRODUODENOSCOPY (EGD) WITH PROPOFOL ;  Surgeon: Cindie Carlin POUR, DO;  [facility];  Service: Endoscopy;  Laterality: N/A;  8:00AM;ASA 3   NECK MASS EXCISION Right    done in dr's office; behind right ear/side of ncek   POLYPECTOMY  11/21/2016   Procedure: POLYPECTOMY;  Surgeon: Harvey Margo CROME, MD;  [facility];  Service: Endoscopy;;  descending colon polyp   SHOULDER ARTHROSCOPY W/ ROTATOR CUFF REPAIR Right 2006   acromioclavicular joint arthrosis    Family Psychiatric History: See below  Family History:  Family History  Problem Relation Age of Onset   Lung cancer Mother    Alcohol abuse Mother    Heart attack Father 58   Diabetes Father    Alcohol abuse Father    Asthma Sister    Anxiety disorder Sister    Depression Sister    Anxiety disorder Sister    Hypertension Brother    Hypertension Brother    Heart attack Brother 64   Diabetes Brother    Hypertension Brother    Seizures Brother    Dementia Paternal Uncle    ADD / ADHD Daughter    Dementia Cousin    Colon cancer Neg Hx    Drug abuse Neg Hx    Bipolar disorder Neg Hx    OCD Neg Hx    Paranoid behavior Neg Hx    Schizophrenia Neg Hx    Sexual abuse Neg Hx    Physical abuse Neg Hx     Social  History:  Social History   Socioeconomic History   Marital status: Married    Spouse name: Not on file   Number of children: Not on file   Years of education: Not on file   Highest education level: Not on file  Occupational History   Occupation: full time    Employer: UNEMPLOYED  Tobacco Use   Smoking status: Every Day    Current packs/day: 0.50    Average packs/day: 1 pack/day for 41.6 years (40.0 ttl pk-yrs)    Types: Cigarettes    Start date: 07/17/1982  Smokeless tobacco: Never   Tobacco comments:    1/2 pack a day  Vaping Use   Vaping status: Never Used  Substance and Sexual Activity   Alcohol use: Not Currently   Drug use: No   Sexual activity: Yes    Birth control/protection: None  Other Topics Concern   Not on file  Social History Narrative   Pt lives in [place] with wife.  5 children.  Unemployed due to panic attacks and back pain   Social Drivers of Corporate investment banker Strain: Not on file  Food Insecurity: Not on file  Transportation Needs: Not on file  Physical Activity: Not on file  Stress: Not on file  Social Connections: Not on file    Allergies:  Allergies  Allergen Reactions   Alpha-Gal Anaphylaxis   Dexilant  [Dexlansoprazole ] Anaphylaxis   Mushroom Ext Cmplx(Shiitake-Reishi-Mait) Anaphylaxis    Rapid heart rate.   Penicillins Anaphylaxis    Immediate rash, facial/tongue/throat swelling, SOB or lightheadedness with hypotension   Doxycycline Nausea And Vomiting         Metabolic Disorder Labs: Lab Results  Component Value Date   HGBA1C 5.8 (H) 01/24/2024   MPG 111.15 02/16/2022   MPG 114.02 11/09/2021   No results found for: PROLACTIN Lab Results  Component Value Date   CHOL 119 01/24/2024   TRIG 90 01/24/2024   HDL 39 (L) 01/24/2024   CHOLHDL 3.1 01/24/2024   VLDL 8 06/13/2022   LDLCALC 63 01/24/2024   LDLCALC 60 09/20/2023   Lab Results  Component Value Date   TSH 1.670 01/24/2024   TSH 1.330 09/20/2023     Therapeutic Level Labs: No results found for: LITHIUM No results found for: VALPROATE No results found for: CBMZ  Current Medications: Current Outpatient Medications  Medication Sig Dispense Refill   acetaminophen  (TYLENOL ) 325 MG tablet Take 650 mg by mouth every 6 (six) hours as needed for moderate pain.     albuterol  (VENTOLIN  HFA) 108 (90 Base) MCG/ACT inhaler INHALE 2 PUFFS BY MOUTH EVERY 6 HOURS AS NEEDED FOR COUGHING, WHEEZING, OR SHORTNESS OF BREATH 20.1 g 0   ALPRAZolam  (XANAX ) 0.5 MG tablet Take one twice a day and two at bedtime 120 tablet 2   amLODipine  (NORVASC ) 10 MG tablet Take 1 tablet (10 mg total) by mouth daily. (Patient taking differently: Take 10 mg by mouth every evening.) 90 tablet 1   azelastine  (ASTELIN ) 0.1 % nasal spray Place 2 sprays into both nostrils 2 (two) times daily as needed. Use in each nostril as directed 30 mL 11   budesonide -formoterol  (SYMBICORT ) 160-4.5 MCG/ACT inhaler Take 2 puffs first thing in am and then another 2 puffs about 12 hours later. (Patient taking differently: Inhale 2 puffs into the lungs in the morning and at bedtime. Take 2 puffs first thing in am and then another 2 puffs about 12 hours later.) 1 each 12   dronedarone  (MULTAQ ) 400 MG tablet Take 1 tablet (400 mg total) by mouth 2 (two) times daily with a meal. 180 tablet 3   EPINEPHrine  0.3 mg/0.3 mL IJ SOAJ injection Inject 0.3 mg into the muscle as needed for anaphylaxis. 2 each 1   esomeprazole  (NEXIUM ) 40 MG capsule Take 1 capsule (40 mg total) by mouth 2 (two) times daily before a meal. 60 capsule 5   fluticasone  (FLONASE ) 50 MCG/ACT nasal spray Place 2 sprays into both nostrils daily. 16 g 11   ipratropium (ATROVENT) 0.06 % nasal spray Place 2 sprays  into both nostrils 3 (three) times daily.     isosorbide  mononitrate (IMDUR ) 30 MG 24 hr tablet Take 1 tablet (30 mg total) by mouth daily. (Patient taking differently: Take 30 mg by mouth daily as needed (severe palpitations  due to high HR).) 90 tablet 3   KLOR-CON  M20 20 MEQ tablet TAKE 1 TABLET BY MOUTH EVERY DAY 90 tablet 2   lidocaine  (XYLOCAINE ) 2 % solution Use as directed 15 mLs in the mouth or throat every 3 (three) hours as needed for mouth pain. 100 mL 0   metoprolol  succinate (TOPROL -XL) 25 MG 24 hr tablet Take 0.5 tablets (12.5 mg total) by mouth daily as needed (Palpitations). Can take an extra 12.5mg  as needed for palpitations. 60 tablet 3   nitroGLYCERIN  (NITROSTAT ) 0.4 MG SL tablet Place 1 tablet (0.4 mg total) under the tongue every 5 (five) minutes x 3 doses as needed for chest pain (if no relief after 3rd dose, proceed to the ED for an evaluation). 25 tablet 3   rivaroxaban  (XARELTO ) 20 MG TABS tablet TAKE 1 TABLET BY MOUTH ONCE DAILY WITH SUPPER 90 tablet 1   simvastatin  (ZOCOR ) 20 MG tablet Take 1 tablet by mouth once daily 90 tablet 3   vitamin B-12 (CYANOCOBALAMIN ) 50 MCG tablet Take 50 mcg by mouth daily.     VITAMIN D  PO Take 1 tablet by mouth daily.     No current facility-administered medications for this visit.     Musculoskeletal: Strength & Muscle Tone: within normal limits Gait & Station: normal Patient leans: N/A  Psychiatric Specialty Exam: Review of Systems  Respiratory:  Positive for shortness of breath.   Cardiovascular:  Positive for palpitations.  All other systems reviewed and are negative.   There were no vitals taken for this visit.There is no height or weight on file to calculate BMI.  General Appearance: Casual and Fairly Groomed  Eye Contact:  Good  Speech:  Clear and Coherent  Volume:  Normal  Mood:  Anxious and Euthymic  Affect:  Congruent  Thought Process:  Goal Directed  Orientation:  Full (Time, Place, and Person)  Thought Content: Logical   Suicidal Thoughts:  No  Homicidal Thoughts:  No  Memory:  Immediate;   Good Recent;   Good Remote;   NA  Judgement:  Good  Insight:  Fair  Psychomotor Activity:  Decreased  Concentration:  Concentration: Good  and Attention Span: Good  Recall:  Good  Fund of Knowledge: Good  Language: Good  Akathisia:  No  Handed:  Right  AIMS (if indicated): not done  Assets:  Communication Skills Desire for Improvement Resilience Social Support Talents/Skills  ADL's:  Intact  Cognition: WNL  Sleep:  Fair   Screenings: GAD-7    Flowsheet Row Office Visit from 01/21/2024 in [facility] Office Visit from 01/01/2024 in [facility] Office Visit from 11/20/2023 in [facility] Office Visit from 08/14/2023 in [facility] Office Visit from 07/03/2023 in [facility]  Total GAD-7 Score 8 8 6  0 8   PHQ2-9    Flowsheet Row Office Visit from 01/21/2024 in [facility] Office Visit from 01/01/2024 in [facility] Office Visit from 11/20/2023 in [facility] Office Visit from 08/14/2023 in [facility] Office Visit from 07/03/2023 in [facility]  PHQ-2 Total Score 3 3 3  0  2  PHQ-9 Total Score 8 8 7  0 9   Flowsheet Row ED from 02/05/2024 in [facility] Emergency Department at [facility] from 01/15/2024 in [facility] at [facility] ED from 08/06/2023 in [facility] Emergency Department at [facility]  C-SSRS RISK CATEGORY No Risk No Risk No Risk     Assessment and Plan: This patient is a 58 year old male with a history of anxiety panic attacks and agoraphobia.  He still feels that Xanax  is helping with the symptoms so we will continue Xanax  0.5 mg twice daily for anxiety and 1 mg at bedtime for anxiety and sleep.  He will return to see me in 3 months  Collaboration of Care: Collaboration of Care: Referral or follow-up with counselor/therapist AEB patient will continue therapy with Jerel Pepper in our office  Patient/Guardian was advised Release of Information must be  obtained prior to any record release in order to collaborate their care with an outside provider. Patient/Guardian was advised if they have not already done so to contact the registration department to sign all necessary forms in order for us  to release information regarding their care.   Consent: Patient/Guardian gives verbal consent for treatment and assignment of benefits for services provided during this visit. Patient/Guardian expressed understanding and agreed to proceed.    Barnie Gull, MD 02/11/2024, 11:19 AM

## 2024-02-14 NOTE — Patient Instructions (Incomplete)
 Chronic Rhinitis:  - Use nasal saline rinses before nose sprays such as with Neilmed Sinus Rinse.  Use distilled water .   - Use Flonase  2 sprays each nostril daily. Aim upward and outward. - Use Azelastine  2 sprays each nostril twice daily as needed for runny nose, drainage, sneezing, congestion. Aim upward and outward.  Bug bite Do not cover this area with a Band-aid or occlusive dressing Call the clinic if the area worsens or if you develop a fever  Alpha gal Syndrome - Continue to strictly avoid all mammalian meat. Will recheck levels today, [facility] orders given.  - okay to eat seafood, chicken, [place].  - for SKIN only reaction, okay to take Benadryl 25mg  capsules every 6 hours - for SKIN + ANY additional symptoms, OR IF concern for LIFE THREATENING reaction = Epipen  Autoinjector EpiPen  0.3 mg. - If using Epinephrine  autoinjector, call 911 or go to the ER.   Mouth ulcer Refer to ENT for evaluation and treatment Begin orabase once a day for 7 days  Call the clinic if this treatment plan is not working well for you.  Follow up in 6 months or sooner if needed.

## 2024-02-14 NOTE — Progress Notes (Signed)
 [address] [place] [address] Dept: [phone]  FOLLOW UP NOTE  Patient ID: Richard [name], male    DOB: 05-21-1966  Age: 58 y.o. MRN: [id number] Date of Office Visit: 02/15/2024  Assessment  Chief Complaint: Insect Bite and Labs Only (Repeat Alpha Gal levels)  HPI Richard [name] is a 58 year old male who presents to the clinic for follow-up visit.  He was last seen in this clinic on 09/24/2023 by Dr. Tobie for evaluation of chronic rhinitis and alpha gal allergy .    He reports that, about 1-1/2 weeks ago, he was stung by an insect while he was outside. He reports the sting was red and raised at that time. He denies concomitant cardiovascular or gastrointestinal symptoms.  He reports that over the next few days the sting began to become itchy and eventually opened up and released some thick fluid. He then proceeded to clean the wound with rubbing alcohol, cover with neosporin and a band-aid for the next several days. He reports the wound is not healing well. He does have DM2.   Chronic rhinitis is reported as moderately well-controlled with symptoms including nasal congestion, clear rhinorrhea, and sneezing.  He does report a cough over the last few days producing thick mucus. He is not currently using a nasal saline rinse, nasal steroid spray or antihistamine. His last environmental allergy  skin testing on 12/18/2022 was negative to the environmental panel.    He continues to avoid mammalian meats with no accidental ingestion or EpiPen  use since his last visit to this clinic. He is tolerating dairy products without adverse reaction. His last alpha gal lab on 09/24/2023 indicated alpha gal IgE 1.29, beef IgE 0.13, pork IgE 0.57, and lamb IgE 0.22. Epipen  set is up to date.   He reports a new complaint of an area in his mouth that frequently opens into an oral ulcer. He reports this ares has not fully healed over the last 6 months and is painful while eating.  His current  medications are listed in the chart.    Drug Allergies:  Allergies  Allergen Reactions   Alpha-Gal Anaphylaxis   Dexilant  [Dexlansoprazole ] Anaphylaxis   Mushroom Ext Cmplx(Shiitake-Reishi-Mait) Anaphylaxis    Rapid heart rate.   Penicillins Anaphylaxis    Immediate rash, facial/tongue/throat swelling, SOB or lightheadedness with hypotension   Doxycycline Nausea And Vomiting         Physical Exam: BP 132/66   Pulse 67   Temp 98.1 F (36.7 C)   Resp 16   Wt 169 lb 6 oz (76.8 kg)   SpO2 98%   BMI 22.97 kg/m    Physical Exam Vitals reviewed.  Constitutional:      Appearance: Normal appearance.  HENT:     Head: Normocephalic and atraumatic.     Right Ear: Tympanic membrane normal.     Left Ear: Tympanic membrane normal.     Nose:     Comments: Bilateral nares normal. Pharynx normal. Ears normal. Eyes normal.     Mouth/Throat:     Comments: Denuded area right upper mouth.  Eyes:     Conjunctiva/sclera: Conjunctivae normal.  Cardiovascular:     Rate and Rhythm: Normal rate and regular rhythm.     Heart sounds: Normal heart sounds. No murmur heard. Pulmonary:     Effort: Pulmonary effort is normal.     Breath sounds: Normal breath sounds.     Comments: Lungs clear to auscultation Musculoskeletal:  General: Normal range of motion.     Cervical back: Normal range of motion and neck supple.  Skin:    General: Skin is warm and dry.     Comments: Open area on right shin. No surrounding redness. Not warm to touch  Neurological:     Mental Status: He is alert and oriented to person, place, and time.  Psychiatric:        Mood and Affect: Mood normal.        Behavior: Behavior normal.        Thought Content: Thought content normal.        Judgment: Judgment normal.      Assessment and Plan: 1. Allergy  to alpha-gal   2. Mouth ulcer   3. Insect stings, accidental or unintentional, initial encounter   4. Chronic rhinitis     Patient Instructions  Chronic  Rhinitis:  - Use nasal saline rinses before nose sprays such as with Neilmed Sinus Rinse.  Use distilled water .   - Use Flonase  2 sprays each nostril daily. Aim upward and outward. - Use Azelastine  2 sprays each nostril twice daily as needed for runny nose, drainage, sneezing, congestion. Aim upward and outward.  Bug bite Do not cover this area with a Band-aid or occlusive dressing Call the clinic if the area worsens or if you develop a fever  Alpha gal Syndrome - Continue to strictly avoid all mammalian meat. Will recheck levels today, [facility] orders given.  - okay to eat seafood, chicken, [place].  - for SKIN only reaction, okay to take Benadryl 25mg  capsules every 6 hours - for SKIN + ANY additional symptoms, OR IF concern for LIFE THREATENING reaction = Epipen  Autoinjector EpiPen  0.3 mg. - If using Epinephrine  autoinjector, call 911 or go to the ER.   Mouth ulcer Refer to ENT for evaluation and treatment Begin orabase once a day for 7 days  Call the clinic if this treatment plan is not working well for you.  Follow up in 6 months or sooner if needed.  Return in about 6 months (around 08/17/2024), or if symptoms worsen or fail to improve.    Thank you for the opportunity to care for this patient.  Please do not hesitate to contact me with questions.  Arlean Mutter, FNP [facility]  

## 2024-02-15 ENCOUNTER — Encounter: Payer: Self-pay | Admitting: Family Medicine

## 2024-02-15 ENCOUNTER — Telehealth: Payer: Self-pay

## 2024-02-15 ENCOUNTER — Ambulatory Visit (INDEPENDENT_AMBULATORY_CARE_PROVIDER_SITE_OTHER): Admitting: Family Medicine

## 2024-02-15 VITALS — BP 132/66 | HR 67 | Temp 98.1°F | Resp 16 | Wt 169.4 lb

## 2024-02-15 DIAGNOSIS — T63481A Toxic effect of venom of other arthropod, accidental (unintentional), initial encounter: Secondary | ICD-10-CM

## 2024-02-15 DIAGNOSIS — Z91018 Allergy to other foods: Secondary | ICD-10-CM | POA: Diagnosis not present

## 2024-02-15 DIAGNOSIS — T63481D Toxic effect of venom of other arthropod, accidental (unintentional), subsequent encounter: Secondary | ICD-10-CM | POA: Diagnosis not present

## 2024-02-15 DIAGNOSIS — J310 Chronic rhinitis: Secondary | ICD-10-CM

## 2024-02-15 DIAGNOSIS — K121 Other forms of stomatitis: Secondary | ICD-10-CM | POA: Diagnosis not present

## 2024-02-15 MED ORDER — TRIAMCINOLONE ACETONIDE 0.1 % MT PSTE: 1.0000 | PASTE | Freq: Every day | OROMUCOSAL | 1 refills | Status: DC | PRN

## 2024-02-15 MED ORDER — SILDENAFIL CITRATE 20 MG PO TABS: 20.0000 mg | ORAL_TABLET | Freq: Every day | ORAL | 3 refills | Status: DC | PRN

## 2024-02-15 NOTE — Telephone Encounter (Signed)
-----   Message from Richard [name] sent at 02/14/2024  2:10 PM EDT ----- Please send him in sildenafil 20mg  to take 1-3 prn ----- Message ----- From: Debera Jayson MATSU, MD Sent: 02/06/2024  11:12 AM EDT To: Richard LITTIE Clara, MD  As long as he is not having increasing angina and is not taking Imdur  at all, he could technically be considered for trial of lower dose of Viagra.  Probably would not use Cialis given a longer duration of effect.  Still could have effects on his blood pressure and if he had any recurring angina with treatment he would have to consider other options. ----- Message ----- From: [name] Richard LITTIE, MD Sent: 02/06/2024  11:06 AM EDT To: Jayson MATSU Debera, MD  The patient saw me today and he was interested in treatment for his erectile dysfunction. I am very hesitant to give him a PDE5 given his current heart issues. I was going to recommend a vacuum erection device unl;ess you believe viagra/cialis would be safe?  Thank you  Richard [name]

## 2024-02-18 ENCOUNTER — Telehealth: Payer: Self-pay

## 2024-02-18 NOTE — Telephone Encounter (Signed)
 Medication prior authorization request received.  Completed PA request through cover my meds for drug Sildenafil . KEY: [id number]  Approved: Pending

## 2024-02-26 DIAGNOSIS — Z419 Encounter for procedure for purposes other than remedying health state, unspecified: Secondary | ICD-10-CM | POA: Diagnosis not present

## 2024-03-04 DIAGNOSIS — J310 Chronic rhinitis: Secondary | ICD-10-CM | POA: Diagnosis not present

## 2024-03-05 ENCOUNTER — Ambulatory Visit (INDEPENDENT_AMBULATORY_CARE_PROVIDER_SITE_OTHER): Admitting: Clinical

## 2024-03-05 ENCOUNTER — Ambulatory Visit: Admitting: Acute Care

## 2024-03-05 DIAGNOSIS — F4310 Post-traumatic stress disorder, unspecified: Secondary | ICD-10-CM | POA: Diagnosis not present

## 2024-03-05 DIAGNOSIS — F4001 Agoraphobia with panic disorder: Secondary | ICD-10-CM

## 2024-03-05 DIAGNOSIS — F331 Major depressive disorder, recurrent, moderate: Secondary | ICD-10-CM

## 2024-03-05 DIAGNOSIS — F419 Anxiety disorder, unspecified: Secondary | ICD-10-CM | POA: Diagnosis not present

## 2024-03-05 DIAGNOSIS — F410 Panic disorder [episodic paroxysmal anxiety] without agoraphobia: Secondary | ICD-10-CM | POA: Diagnosis not present

## 2024-03-05 NOTE — Progress Notes (Signed)
 Virtual Visit via Video Note   I connected with Richard [name] on 03/05/24 at  11:00 AM EDT by a video enabled telemedicine application and verified that I am speaking with the correct person using two identifiers.   Location: Patient: home Provider: office   I discussed the limitations of evaluation and management by telemedicine and the availability of in person appointments. The patient expressed understanding and agreed to proceed.     THERAPIST PROGRESS NOTE     Session Time: 11:00 AM-11:55 AM   Participation Level: Active   Behavioral Response: Casual and Alert,Anxious   Type of Therapy: Individual Therapy   Treatment Goals addressed: Mood and Anxiety   Interventions: CBT   Summary: Richard [name] is a 58 y.o. male who presents with panic disorder/depression with anxiety/and PTSD . The OPT therapist worked with the patient for his OPT treatment. The OPT therapist utilized Motivational Interviewing to assist in creating therapeutic repore. The patient in the session was engaged and work in collaboration giving feedback about his triggers and symptoms over the past few weeks. The patient spoke about his external stressor of home repair needs and identified mold and this contributing to difficulty with breathing. The patient spoke about his plan to have his son come and help repair the skylight in the home and material around the skylight. The OPT therapist utilized Cognitive Behavioral Therapy through cognitive restructuring as well as worked with the patient on coping strategies to assist in management of symptoms as well as reviewed sleep, eating habits, and general health. The patient continues to work on acceptance of limited mobility based on health conditions and spoke about the impact of his physical health on his functioning. The OPT therapist worked with the patient on staying active and the patient spoke about with recent cooler weather getting out of the home more. The  patient spoke about being in distress due to the needs of the home repairs. The patient spoke about hoping his son will be free to come help. The patient in this session spoke more about the history and experience connected to his Anxiety and his work over the years to learn how to use grounding and coping techniques. The patient is going to prioritize repairs needed in the home moving into the Fall to improve the air quality and decrease negative impacts of mold.The OPT therapist overviewed upcoming health appointments listed in the patients MyChart.   Suicidal/Homicidal: Nowithout intent/plan   Therapist Response:The OPT therapist worked with the patient for the patients scheduled session. The patient was engaged in his session and gave feedback in relation to triggers, symptoms, and behavior responses over the past few weeks.The patient spoke about the impact of the repairs that need to be completed to the home and the concern that mold in the home is creating/contributing to the patient and his wife's health problems with breathing. The OPT therapist worked with the patient utilizing an in session Cognitive Behavioral Therapy exercise. The patient was responsive in the session and verbalized,  We have been exposed to and living in the home with mold and its bothering our health and mental health and we may end up just having to abandon ship and leave if can not get it fixed , we notice when we leave the house for a few hours it seems like we are able to breathe better.The OPT therapist worked with the patient providing ongoing psychotherapy/education and coping skills review. The patient overviewed his physical health and ongoing treatment  of Alpha-Gal . The patient spoke about utilizing his support network of some of the neighbors who live near him and hope his son will be able to come and help him make repairs to the home. The patient spoke about efforts to make good choices and not put his health at  risk by pushing past his limits . The OPT therapist will continue treatment work with the patient in his next scheduled session   Plan: Return again in 2/3 weeks.   Diagnosis:      Axis I: PTSD/Panic Disorder/ Depression with anxiety  Axis II: No diagnosis      Collaboration of Care: Overview of the patients involvement in the Med Management program with Dr. Okey.   Patient/Guardian was advised Release of Information must be obtained prior to any record release in order to collaborate their care with an outside provider. Patient/Guardian was advised if they have not already done so to contact the registration department to sign all necessary forms in order for us  to release information regarding their care.    Consent: Patient/Guardian gives verbal consent for treatment and assignment of benefits for services provided during this visit. Patient/Guardian expressed understanding and agreed to proceed.    I discussed the assessment and treatment plan with the patient. The patient was provided an opportunity to ask questions and all were answered. The patient agreed with the plan and demonstrated an understanding of the instructions.   The patient was advised to call back or seek an in-person evaluation if the symptoms worsen or if the condition fails to improve as anticipated.   I provided 55 minutes of non-face-to-face time during this encounter.   Richard ONEIDA Pepper, LCSW    03/05/2024

## 2024-03-06 ENCOUNTER — Emergency Department (HOSPITAL_COMMUNITY)
Admission: EM | Admit: 2024-03-06 | Discharge: 2024-03-07 | Disposition: A | Discharge status: Home / Self Care | Attending: Emergency Medicine | Admitting: Emergency Medicine

## 2024-03-06 ENCOUNTER — Emergency Department (HOSPITAL_COMMUNITY)

## 2024-03-06 ENCOUNTER — Encounter (HOSPITAL_COMMUNITY): Payer: Self-pay

## 2024-03-06 ENCOUNTER — Other Ambulatory Visit: Payer: Self-pay

## 2024-03-06 DIAGNOSIS — I4891 Unspecified atrial fibrillation: Secondary | ICD-10-CM | POA: Diagnosis not present

## 2024-03-06 DIAGNOSIS — R079 Chest pain, unspecified: Secondary | ICD-10-CM | POA: Insufficient documentation

## 2024-03-06 DIAGNOSIS — I10 Essential (primary) hypertension: Secondary | ICD-10-CM | POA: Insufficient documentation

## 2024-03-06 DIAGNOSIS — I2510 Atherosclerotic heart disease of native coronary artery without angina pectoris: Secondary | ICD-10-CM | POA: Insufficient documentation

## 2024-03-06 DIAGNOSIS — Z8673 Personal history of transient ischemic attack (TIA), and cerebral infarction without residual deficits: Secondary | ICD-10-CM | POA: Diagnosis not present

## 2024-03-06 DIAGNOSIS — F17210 Nicotine dependence, cigarettes, uncomplicated: Secondary | ICD-10-CM | POA: Diagnosis not present

## 2024-03-06 DIAGNOSIS — J449 Chronic obstructive pulmonary disease, unspecified: Secondary | ICD-10-CM | POA: Insufficient documentation

## 2024-03-06 DIAGNOSIS — E119 Type 2 diabetes mellitus without complications: Secondary | ICD-10-CM | POA: Insufficient documentation

## 2024-03-06 DIAGNOSIS — R0789 Other chest pain: Secondary | ICD-10-CM | POA: Diagnosis not present

## 2024-03-06 DIAGNOSIS — Z743 Need for continuous supervision: Secondary | ICD-10-CM | POA: Diagnosis not present

## 2024-03-06 LAB — BASIC METABOLIC PANEL WITH GFR
Anion gap: 14 (ref 5–15)
BUN: 18 mg/dL (ref 6–20)
CO2: 22 mmol/L (ref 22–32)
Calcium: 8.9 mg/dL (ref 8.9–10.3)
Chloride: 101 mmol/L (ref 98–111)
Creatinine, Ser: 0.93 mg/dL (ref 0.61–1.24)
GFR, Estimated: >60 mL/min (ref >60)
Glucose, Bld: 122 mg/dL — ABNORMAL HIGH (ref 70–99)
Potassium: 3.7 mmol/L (ref 3.5–5.1)
Sodium: 137 mmol/L (ref 135–145)

## 2024-03-06 LAB — CBC
HCT: 43 % (ref 39.0–52.0)
Hemoglobin: 14.9 g/dL (ref 13.0–17.0)
MCH: 32.2 pg (ref 26.0–34.0)
MCHC: 34.7 g/dL (ref 30.0–36.0)
MCV: 92.9 fL (ref 80.0–100.0)
Platelets: 194 K/uL (ref 150–400)
RBC: 4.63 MIL/uL (ref 4.22–5.81)
RDW: 12.1 % (ref 11.5–15.5)
WBC: 6.5 K/uL (ref 4.0–10.5)
nRBC: 0 % (ref 0.0–0.2)

## 2024-03-06 LAB — TROPONIN I (HIGH SENSITIVITY): Troponin I (High Sensitivity): 2 ng/L (ref <18)

## 2024-03-06 NOTE — ED Triage Notes (Signed)
 Pt reports chest pain x 4 hours that improved after taking 324mg  of baby aspirin  and a xanax .

## 2024-03-07 ENCOUNTER — Encounter: Payer: Self-pay | Admitting: Radiology

## 2024-03-07 LAB — TROPONIN I (HIGH SENSITIVITY): Troponin I (High Sensitivity): 2 ng/L (ref <18)

## 2024-03-07 MED ORDER — POTASSIUM CHLORIDE 20 MEQ PO PACK
40.0000 meq | PACK | Freq: Once | ORAL | Status: AC
  Administered 2024-03-07: 40 meq via ORAL
  Filled 2024-03-07: qty 2

## 2024-03-07 NOTE — Discharge Instructions (Signed)
 You were evaluated in the Emergency Department and after careful evaluation, we did not find any emergent condition requiring admission or further testing in the hospital.  Your exam/testing today is overall reassuring.  Keep your follow-up with your cardiologist.  Please return to the Emergency Department if you experience any worsening of your condition.   Thank you for allowing us  to be a part of your care.

## 2024-03-07 NOTE — ED Notes (Signed)
 AVS provided by edp was reviewed with the pt. Pt verbalized understanding with no additional questions at this time. Pt to go home with s/o at bedside

## 2024-03-07 NOTE — ED Provider Notes (Signed)
 [facility] Emergency Department Provider Note MRN:  [id number]  Arrival date & time: 03/07/24     Chief Complaint   Chest Pain   History of Present Illness   Richard [name] is a 58 y.o. year-old male with a history of CAD, COPD presenting to the ED with chief complaint of chest pain.  Pain in the chest this evening at 6 PM, experiences pains often but tonight was different because it would not go away and became more intense.  He tried to stand up and then he got really lightheaded, got sweaty.  Still having some mild pain.  Denies shortness of breath, no leg pain or swelling.  Review of Systems  A thorough review of systems was obtained and all systems are negative except as noted in the HPI and PMH.   Patient's Health History    Past Medical History:  Diagnosis Date   Arthritis    Asthma    CAD (coronary artery disease)    a. Cardiac cath 07/2015 showed 65% distal Cx, 20% mid-distal LAD, 20% prox-distal RCA, EF 60%, EDP .   Colitis 1990   COPD (chronic obstructive pulmonary disease) (HCC)    Depression    Dysrhythmia    Essential hypertension    Fatty liver    Gastric ulcer 2003; 2012   2003: + esophagitis; negative H.pylori serology  2012: Dr. Harvey, mild gastritis, Bravo PH probe placement, negative H.pylori   GERD (gastroesophageal reflux disease)    Hepatic steatosis    History of hiatal hernia    Hyperlipemia    Overweight    Panic attacks    Paroxysmal atrial fibrillation (HCC)    Pulmonary nodules    Stroke (HCC)    TIA (transient ischemic attack)    Type II diabetes mellitus (HCC)     Past Surgical History:  Procedure Laterality Date   BALLOON DILATION N/A 07/25/2021   Procedure: BALLOON DILATION;  Surgeon: Cindie Carlin POUR, DO;  Location: [facility] ENDO SUITE;  Service: Endoscopy;  Laterality: N/A;   BIOPSY  07/25/2021   Procedure: BIOPSY;  Surgeon: Cindie Carlin POUR, DO;  Location: [facility] ENDO SU[facility];  Service: Endoscopy;;   BIOPSY   12/04/2022   Procedure: BIOPSY;  Surgeon: Cindie Carlin POUR, DO;  Location: [facility] ENDO SU[facility];  Service: Endoscopy;;   BRAVO PH STUDY  05/03/2011   DOQ:Fpoi gastritis/normal esophagus and duodenum   CARDIAC CATHETERIZATION  1990s X 1; 2005; 08/12/2015   CARDIAC CATHETERIZATION N/A 08/12/2015   Procedure: Left Heart Cath and Coronary Angiography;  Surgeon: Victory LELON Sharps, MD; LAD 20%, CFX 65%, RCA 20%, EF 60%    COLONOSCOPY  1990   COLONOSCOPY WITH PROPOFOL  N/A 11/21/2016   Dr. Harvey: non-thrombosed external hemorrhoids, one 6 mm polyp (polypoid lesion), internal hemorrhoids. TI Normal. 10 years screening   ESOPHAGOGASTRODUODENOSCOPY  05/03/2011   DOQ:fpoi gastritis   ESOPHAGOGASTRODUODENOSCOPY (EGD) WITH PROPOFOL  N/A 07/25/2021   Procedure: ESOPHAGOGASTRODUODENOSCOPY (EGD) WITH PROPOFOL ;  Surgeon: Cindie Carlin POUR, DO;  Location: [facility];  Service: Endoscopy;  Laterality: N/A;  1:30pm   ESOPHAGOGASTRODUODENOSCOPY (EGD) WITH PROPOFOL  N/A 12/04/2022   Procedure: ESOPHAGOGASTRODUODENOSCOPY (EGD) WITH PROPOFOL ;  Surgeon: Cindie Carlin POUR, DO;  Location: [facility];  Service: Endoscopy;  Laterality: N/A;  8:00AM;ASA 3   NECK MASS EXCISION Right    done in dr's office; behind right ear/side of ncek   POLYPECTOMY  11/21/2016   Procedure: POLYPECTOMY;  Surgeon: Harvey Margo CROME, MD;  Location: [facility] END[facility] SU[facility];  Service: Endoscopy;;  descending colon polyp   SHOULDER ARTHROSCOPY W/ ROTATOR CUFF REPAIR Right 2006   acromioclavicular joint arthrosis    Family History  Problem Relation Age of Onset   Lung cancer Mother    Alcohol abuse Mother    Heart attack Father 80   Diabetes Father    Alcohol abuse Father    Asthma Sister    Anxiety disorder Sister    Depression Sister    Anxiety disorder Sister    Hypertension Brother    Hypertension Brother    Heart attack Brother 58   Diabetes Brother    Hypertension Brother    Seizures Brother    Dementia Paternal Uncle    ADD / ADHD Daughter     Dementia Cousin    Colon cancer Neg Hx    Drug abuse Neg Hx    Bipolar disorder Neg Hx    OCD Neg Hx    Paranoid behavior Neg Hx    Schizophrenia Neg Hx    Sexual abuse Neg Hx    Physical abuse Neg Hx     Social History   Socioeconomic History   Marital status: Married    Spouse name: Not on file   Number of children: Not on file   Years of education: Not on file   Highest education level: Not on file  Occupational History   Occupation: full time    Employer: UNEMPLOYED  Tobacco Use   Smoking status: Every Day    Current packs/day: 0.50    Average packs/day: 1 pack/day for 41.6 years (40.1 ttl pk-yrs)    Types: Cigarettes    Start date: 07/17/1982   Smokeless tobacco: Never   Tobacco comments:    1/2 pack a day  Vaping Use   Vaping status: Never Used  Substance and Sexual Activity   Alcohol use: Not Currently   Drug use: No   Sexual activity: Yes    Birth control/protection: None  Other Topics Concern   Not on file  Social History Narrative   Pt lives in [place] with wife.  5 children.  Unemployed due to panic attacks and back pain   Social Drivers of Corporate investment banker Strain: Not on file  Food Insecurity: Not on file  Transportation Needs: Not on file  Physical Activity: Not on file  Stress: Not on file  Social Connections: Not on file  Intimate Partner Violence: Not on file     Physical Exam   Vitals:   03/07/24 0200 03/07/24 0215  BP: 109/69   Pulse: (!) 43   Resp: 12   Temp:  (!) 97.5 F (36.4 C)  SpO2: 95%     CONSTITUTIONAL: Well-appearing, NAD NEURO/PSYCH:  Alert and oriented x 3, no focal deficits EYES:  eyes equal and reactive ENT/NECK:  no LAD, no JVD CARDIO: Regular rate, well-perfused, normal S1 and S2 PULM:  CTAB no wheezing or rhonchi GI/GU:  non-distended, non-tender MSK/SPINE:  No gross deformities, no edema SKIN:  no rash, atraumatic   *Additional and/or pertinent findings included in MDM below  Diagnostic and  Interventional Summary    EKG Interpretation Date/Time:  March 06, 2024 at 22: 13: Time Ventricular Rate:   54 PR Interval:   180 QRS Duration:   92 QT Interval:   440 QTC Calculation:  417 R Axis:      Text Interpretation: Sinus rhythm, bradycardia, no ischemic findings       Labs Reviewed  BASIC METABOLIC  PANEL WITH GFR - Abnormal; Notable for the following components:      Result Value   Glucose, Bld 122 (*)    All other components within normal limits  CBC  TROPONIN I (HIGH SENSITIVITY)  TROPONIN I (HIGH SENSITIVITY)    DG Chest 2 View  Final Result      Medications  potassium chloride  (KLOR-CON ) packet 40 mEq (40 mEq Oral Given 03/07/24 0210)     Procedures  /  Critical Care Procedures  ED Course and Medical Decision Making  Initial Impression and Ddx ACS is considered given patient's history of CAD.  Nothing to suggest PE, doubt dissection.  Pain is present but mild.  Reassuring vitals.  Will need troponin x 2.  Past medical/surgical history that increases complexity of ED encounter: CAD  Interpretation of Diagnostics I personally reviewed the EKG and my interpretation is as follows: Sinus rhythm without significant ischemic findings  No significant blood count or electrolyte disturbance.  Troponin negative x 2  Patient Reassessment and Ultimate Disposition/Management     Patient pain-free on reassessment, resting comfortably, favoring noncardiac pain given the reassuring workup and EKG however important that patient has close cardiology follow-up which he does in the next 2 weeks.  Appropriate for discharge with return precautions.  Patient management required discussion with the following services or consulting groups:  None  Complexity of Problems Addressed Acute illness or injury that poses threat of life of bodily function  Additional Data Reviewed and Analyzed Further history obtained from: Further history from spouse/family member  Additional  Factors Impacting ED Encounter Risk Consideration of hospitalization  Ozell HERO. Theadore, MD [facility] Emergency Medicine [facility] [email or web address]  Final Clinical Impressions(s) / ED Diagnoses     ICD-10-CM   1. Chest pain, unspecified type  R07.9       ED Discharge Orders     None        Discharge Instructions Discussed with and Provided to Patient:     Discharge Instructions      You were evaluated in the Emergency Department and after careful evaluation, we did not find any emergent condition requiring admission or further testing in the hospital.  Your exam/testing today is overall reassuring.  Keep your follow-up with your cardiologist.  Please return to the Emergency Department if you experience any worsening of your condition.   Thank you for allowing us  to be a part of your care.       Theadore Ozell HERO, MD 03/07/24 [phone]

## 2024-03-25 ENCOUNTER — Ambulatory Visit: Attending: Cardiology | Admitting: Cardiology

## 2024-03-25 ENCOUNTER — Encounter: Payer: Self-pay | Admitting: Cardiology

## 2024-03-25 VITALS — BP 122/70 | HR 60 | Ht 72.0 in | Wt 169.8 lb

## 2024-03-25 DIAGNOSIS — E782 Mixed hyperlipidemia: Secondary | ICD-10-CM | POA: Diagnosis not present

## 2024-03-25 DIAGNOSIS — I480 Paroxysmal atrial fibrillation: Secondary | ICD-10-CM | POA: Insufficient documentation

## 2024-03-25 DIAGNOSIS — I2510 Atherosclerotic heart disease of native coronary artery without angina pectoris: Secondary | ICD-10-CM | POA: Insufficient documentation

## 2024-03-25 DIAGNOSIS — I10 Essential (primary) hypertension: Secondary | ICD-10-CM | POA: Insufficient documentation

## 2024-03-25 MED ORDER — METOPROLOL SUCCINATE ER 25 MG PO TB24: 12.5000 mg | ORAL_TABLET | ORAL | 3 refills | Status: DC | PRN

## 2024-03-25 NOTE — Patient Instructions (Signed)
 Medication Instructions:  Your physician has recommended you make the following change in your medication:   Toprol  XL 12.5 mg as needed   *If you need a refill on your cardiac medications before your next appointment, please call your pharmacy*  Lab Work: NONE   If you have labs (blood work) drawn today and your tests are completely normal, you will receive your results only by: MyChart Message (if you have MyChart) OR A paper copy in the mail If you have any lab test that is abnormal or we need to change your treatment, we will call you to review the results.  Testing/Procedures: NONE   Follow-Up: At [facility], you and your health needs are our priority.  As part of our continuing mission to provide you with exceptional heart care, our providers are all part of one team.  This team includes your primary Cardiologist (physician) and Advanced Practice Providers or APPs (Physician Assistants and Nurse Practitioners) who all work together to provide you with the care you need, when you need it.  Your next appointment:   3 month(s)  Provider:   Jayson Sierras, MD    We recommend signing up for the patient portal called MyChart.  Sign up information is provided on this After Visit Summary.  MyChart is used to connect with patients for Virtual Visits (Telemedicine).  Patients are able to view lab/test results, encounter notes, upcoming appointments, etc.  Non-urgent messages can be sent to your provider as well.   To learn more about what you can do with MyChart, go to [email or web address].   Other Instructions Thank you for choosing [facility]!

## 2024-03-25 NOTE — Progress Notes (Signed)
    Cardiology Office Note  Date: 03/25/2024   ID: WYMON SWANEY, DOB June 11, 1966, MRN [id number]  History of Present Illness: Richard [name] is a 58 y.o. male last seen in May by Ms. Strader PA-C, I reviewed her note.  He is here today with his wife for a follow-up visit. He was seen in the ER in August for evaluation of chest discomfort.  Workup at that time revealed normal high-sensitivity troponin I levels and no acute ECG changes.  Chest x-ray reported no acute findings.  He does not report specific recurring symptoms, does feel a sense of palpitations at times.  No dizziness or syncope.  Medications reviewed.  He has been trying to take Toprol -XL 6.25 mg daily (quartering a 25 mg tablet).  I asked him to switch plan to Toprol -XL 12.5 mg as needed for palpitations rather than standing.  He is also on Multaq  with low resting heart rate in sinus rhythm.  He does not report any bleeding problems on Xarelto  and otherwise continues to track blood pressure at home.  Physical Exam: VS:  BP 122/70 (BP Location: Left Arm, Cuff Size: Normal)   Pulse 60   Ht 6' (1.829 m)   Wt 169 lb 12.8 oz (77 kg)   SpO2 96%   BMI 23.03 kg/m , BMI Body mass index is 23.03 kg/m.  Wt Readings from Last 3 Encounters:  03/25/24 169 lb 12.8 oz (77 kg)  03/06/24 169 lb (76.7 kg)  02/15/24 169 lb 6 oz (76.8 kg)    General: Patient appears comfortable at rest. HEENT: Conjunctiva and lids normal. Neck: Supple, no elevated JVP or carotid bruits. Lungs: Clear to auscultation, nonlabored breathing at rest. Cardiac: Regular rate and rhythm, no S3 or significant systolic murmur. Extremities: No pitting edema.  ECG:  An ECG dated 03/06/2024 was personally reviewed today and demonstrated:  Sinus bradycardia.  Labwork: 01/24/2024: ALT 16; AST 14; TSH 1.670 02/05/2024: Magnesium  2.1 03/06/2024: BUN 18; Creatinine, Ser 0.93; Hemoglobin 14.9; Platelets 194; Potassium 3.7; Sodium 137     Component Value Date/Time   CHOL  119 01/24/2024 0856   TRIG 90 01/24/2024 0856   HDL 39 (L) 01/24/2024 0856   CHOLHDL 3.1 01/24/2024 0856   CHOLHDL 2.9 06/13/2022 0945   VLDL 8 06/13/2022 0945   LDLCALC 63 01/24/2024 0856   Other Studies Reviewed Today:  No interval cardiac testing for review today.  Assessment and Plan:  1.  Nonobstructive CAD documented by previous cardiac catheterization in 2017 and more recently by coronary CTA in October 2024 demonstrating moderate stenoses with FFR significant only in distal vessels and plan for medical therapy.  No accelerating angina, cardiac enzymes normal during ER visit in August.  Plan to continue observation on medical therapy.  He is not taking Imdur  at this time but continues on Zocor  20 mg daily with as needed nitroglycerin .  2.  Paroxysmal atrial fibrillation.  CHA2DS2-VASc score is 2.  Recommend switching Toprol -XL to 12.5 mg as needed palpitations rather than on a standing basis.  Continue Xarelto  20 mg daily for stroke prophylaxis.  3.  Primary hypertension.  Continue to track at home.  He is on Norvasc  10 mg daily.  4.  Mixed hyperlipidemia.  LDL 63 in July.  Continue Zocor  20 mg daily.  Disposition:  Follow up 3 months.  Signed, Jayson JUDITHANN Sierras, M.D., F.A.C.C. [facility] at [facility]

## 2024-03-27 DIAGNOSIS — Z91018 Allergy to other foods: Secondary | ICD-10-CM | POA: Diagnosis not present

## 2024-03-28 ENCOUNTER — Encounter: Payer: Self-pay | Admitting: Acute Care

## 2024-03-28 ENCOUNTER — Ambulatory Visit: Admitting: Acute Care

## 2024-03-28 VITALS — BP 128/64 | HR 56 | Temp 97.8°F | Ht 72.0 in | Wt 169.4 lb

## 2024-03-28 DIAGNOSIS — F17210 Nicotine dependence, cigarettes, uncomplicated: Secondary | ICD-10-CM

## 2024-03-28 DIAGNOSIS — F17200 Nicotine dependence, unspecified, uncomplicated: Secondary | ICD-10-CM

## 2024-03-28 DIAGNOSIS — R911 Solitary pulmonary nodule: Secondary | ICD-10-CM | POA: Diagnosis not present

## 2024-03-28 DIAGNOSIS — K118 Other diseases of salivary glands: Secondary | ICD-10-CM

## 2024-03-28 DIAGNOSIS — R9389 Abnormal findings on diagnostic imaging of other specified body structures: Secondary | ICD-10-CM | POA: Diagnosis not present

## 2024-03-28 DIAGNOSIS — R942 Abnormal results of pulmonary function studies: Secondary | ICD-10-CM | POA: Diagnosis not present

## 2024-03-28 DIAGNOSIS — Z419 Encounter for procedure for purposes other than remedying health state, unspecified: Secondary | ICD-10-CM | POA: Diagnosis not present

## 2024-03-28 NOTE — Patient Instructions (Addendum)
 It is good to see you today. The PET scan shows the nodule of concern does not have PET avidity. We will do a 3 month follow up CT Chest to maintain surveillance. You will get a call to get this scheduled.  You will follow up with me 1-2 weeks after the scan to review the results. I will also order an MRI pre and post contrast to better evaluate the abnormal findings on the parotid scan.  You will get a call to get this scheduled. You will follow up 1-2 weeks after the scan to review the results. Follow up after the MRI with Lauraine NP to review results. Call for any additional unintentional weight loss, or blood in your sputum to be seen sooner.  Please contact office for sooner follow up if symptoms do not improve or worsen or seek emergency care

## 2024-03-28 NOTE — Progress Notes (Signed)
 History of Present Illness Richard [name] is a 58 y.o. male current every day smoker followed through the lung cancer screening program, referred for lung nodule consult 12/2023 for an abnormal LDCT. He had a new irregular solid left lower lobe 13.1 pulmonary nodule, as well as a 6.6 mm left lower lobe pulmonary nodule neither of which were there 12 months ago. He will be followed by Dr. Shelah, and Lauraine Lites NP.    03/28/2024 Discussed the use of AI scribe software for clinical note transcription with the patient, who gave verbal consent to proceed.  History of Present Illness Pt. Presents for follow up after PET. PET was done 01/17/2024. He rescheduled several appointments, so follow up is a bit delayed.   PET scan was negative for uptake in the left lower lobe, which was the area of concern. Recommendation is for a 3 month follow up scan to monitor these nodule for growth or morphologic changes. Pt. Is in agreement with this plan.   There was however uptake along a focal nodule along the deep lobe of left parotid gland with abnormal uptake. An Intrinsic parotid lesion is possible. Per the patient he had an abscessed tooth in the lower left jaw at the time of the PET. I am unsure if this may be reactive, vs a lesion. He states he has tenderness there with pain still, even though the abscess has resolved. Radiology recommended an MRI pre/post with contrast, , however I called and spoke with radiology who actually recommended a CT soft tissue neck with contrast. I have ordered this as well as the 3 month follow up non contrast Ct Chest. I am concerned because patient has a history of incidental weight loss of 68.2 pounds in a 3 month period of time about 2 years ago. No change in diet, or exercise.He has gained 10 pounds of this back.   I have counseled the patient to quit smoking x 3 minutes. He verbalized understanding but told me he cannot quit as it makes him stop breathing. We will discuss a  sleep study to assess for OSA at follow up.    Test Results: PET Scan 01/17/2024 The nodular areas along the left lower lobe on the recent CT scan do not show abnormal uptake. Follow up CT scan 3 months may be useful. More aggressive approach could be considered as clinically appropriate.   There is a focal nodule along the deep lobe of left parotid gland with abnormal uptake. Intrinsic parotid lesion is possible. Please correlate for known history or additional workup with MRI pre and post contrast may be useful to further delineation of a aggressive lesion.   Stable central mesenteric stranding. 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 Coronary artery calcifications. Please correlate for other coronary risk factors.   Slight wall thickening of the urinary bladder with a prominent prostate.  LDCT 12/21/2023 No significant thyroid  nodules. Unremarkable esophagus. No pathologically enlarged axillary, mediastinal or hilar lymph nodes, noting limited sensitivity for the detection of hilar adenopathy on this noncontrast study.   Lungs/Pleura: No pneumothorax. No pleural effusion. Mild centrilobular emphysema with diffuse bronchial wall thickening. No acute consolidative airspace disease or lung masses. New irregular solid anterior peripheral basilar left lower lobe 13.1 mm pulmonary nodule on series 4/image 265. New irregular solid 6.6 mm anterior peripheral left lower lobe pulmonary nodule on series 4/image 242. Indistinct 4.8 mm posterior left lower lobe nodule  on series 4/image 225 is stable.   Upper abdomen: No acute abnormality.   Musculoskeletal: No aggressive appearing focal osseous lesions. Mild thoracic spondylosis.   IMPRESSION: 1. Lung-RADS 4B, suspicious. Additional imaging evaluation or consultation with Pulmonology or Thoracic Surgery recommended. New irregular  solid 13.1 mm and 6.6 mm anterior peripheral left lower lobe pulmonary nodules. Suggest further evaluation with PET-CT at this time.       Latest Ref Rng & Units 03/06/2024   11:04 PM 02/05/2024    9:45 PM 01/24/2024    8:56 AM  CBC  WBC 4.0 - 10.5 K/uL 6.5  5.2  6.8   Hemoglobin 13.0 - 17.0 g/dL 85.0  85.0  84.8   Hematocrit 39.0 - 52.0 % 43.0  42.6  45.5   Platelets 150 - 400 K/uL 194  174  195        Latest Ref Rng & Units 03/06/2024   11:04 PM 02/05/2024    9:45 PM 01/24/2024    8:56 AM  BMP  Glucose 70 - 99 mg/dL 877  823  92   BUN 6 - 20 mg/dL 18  16  15    Creatinine 0.61 - 1.24 mg/dL 9.06  9.08  9.09   BUN/Creat Ratio 9 - 20   17   Sodium 135 - 145 mmol/L 137  139  142   Potassium 3.5 - 5.1 mmol/L 3.7  3.9  4.2   Chloride 98 - 111 mmol/L 101  102  103   CO2 22 - 32 mmol/L 22  24  21    Calcium  8.9 - 10.3 mg/dL 8.9  8.8  9.4     BNP    Component Value Date/Time   BNP 24.0 05/31/2018 1208    ProBNP    Component Value Date/Time   PROBNP <30.0 01/26/2008 0441    PFT    Component Value Date/Time   FEV1PRE 3.77 09/13/2023 1005   FEV1POST 3.89 09/13/2023 1005   FVCPRE 5.02 09/13/2023 1005   FVCPOST 5.15 09/13/2023 1005   TLC 7.86 09/13/2023 1005   DLCOUNC 29.57 09/13/2023 1005   PREFEV1FVCRT 75 09/13/2023 1005   PSTFEV1FVCRT 75 09/13/2023 1005    DG Chest 2 View Result Date: 03/06/2024 CLINICAL DATA:  Chest pain. EXAM: CHEST - 2 VIEW COMPARISON:  02/05/2024, CT 12/21/2023 FINDINGS: The cardiomediastinal contours are normal. Chronic hyperinflation. Pulmonary vasculature is normal. No consolidation, pleural effusion, or pneumothorax. No acute osseous abnormalities are seen. IMPRESSION: Chronic hyperinflation.  No acute findings. Electronically Signed   By: Andrea Gasman M.D.   On: 03/06/2024 23:01     Past medical hx Past Medical History:  Diagnosis Date   Arthritis    Asthma    CAD (coronary artery disease)    a. Cardiac cath 07/2015 showed 65% distal  Cx, 20% mid-distal LAD, 20% prox-distal RCA, EF 60%, EDP .   Colitis 1990   COPD (chronic obstructive pulmonary disease) (HCC)    Depression    Dysrhythmia    Essential hypertension    Fatty liver    Gastric ulcer 2003; 2012   2003: + esophagitis; negative H.pylori serology  2012: Dr. Harvey, mild gastritis, Bravo PH probe placement, negative H.pylori   GERD (gastroesophageal reflux disease)    Hepatic steatosis    History of hiatal hernia    Hyperlipemia    Overweight    Panic attacks    Paroxysmal atrial fibrillation (HCC)    Pulmonary nodules    Stroke [facility])    TIA (transient  ischemic attack)    Type II diabetes mellitus (HCC)      Social History   Tobacco Use   Smoking status: Every Day    Current packs/day: 0.50    Average packs/day: 1 pack/day for 41.7 years (40.1 ttl pk-yrs)    Types: Cigarettes    Start date: 07/17/1982   Smokeless tobacco: Never   Tobacco comments:    1/2 pack a day 03/28/2024 KRD  Vaping Use   Vaping status: Never Used  Substance Use Topics   Alcohol use: Not Currently   Drug use: No    Mr.Staubs reports that he has been smoking cigarettes. He started smoking about 41 years ago. He has a 40.1 pack-year smoking history. He has never used smokeless tobacco. He reports that he does not currently use alcohol. He reports that he does not use drugs.  Tobacco Cessation: Ready to quit: Not Answered Counseling given: Not Answered Tobacco comments: 1/2 pack a day 03/28/2024 KRD Current every day smoker , I spent 3-4 minutes counseling patient on  steps to stop use of tobacco products. I have provided patient with information on receiving free nicotine  replacement therapy, and contact numbers for hypnosis for smoking cessation as well as acupuncture for smoking cessation.   Past surgical hx, Family hx, Social hx all reviewed.  Current Outpatient Medications on File Prior to Visit  Medication Sig   acetaminophen  (TYLENOL ) 325 MG tablet Take 650 mg  by mouth every 6 (six) hours as needed for moderate pain.   albuterol  (VENTOLIN  HFA) 108 (90 Base) MCG/ACT inhaler INHALE 2 PUFFS BY MOUTH EVERY 6 HOURS AS NEEDED FOR COUGHING, WHEEZING, OR SHORTNESS OF BREATH   ALPRAZolam  (XANAX ) 0.5 MG tablet Take one twice a day and two at bedtime   amLODipine  (NORVASC ) 10 MG tablet Take 1 tablet (10 mg total) by mouth daily.   azelastine  (ASTELIN ) 0.1 % nasal spray Place 2 sprays into both nostrils 2 (two) times daily as needed. Use in each nostril as directed   budesonide -formoterol  (SYMBICORT ) 160-4.5 MCG/ACT inhaler Take 2 puffs first thing in am and then another 2 puffs about 12 hours later.   dronedarone  (MULTAQ ) 400 MG tablet Take 1 tablet (400 mg total) by mouth 2 (two) times daily with a meal.   EPINEPHrine  0.3 mg/0.3 mL IJ SOAJ injection Inject 0.3 mg into the muscle as needed for anaphylaxis.   esomeprazole  (NEXIUM ) 40 MG capsule Take 1 capsule (40 mg total) by mouth 2 (two) times daily before a meal.   fluticasone  (FLONASE ) 50 MCG/ACT nasal spray Place 2 sprays into both nostrils daily.   ipratropium (ATROVENT) 0.06 % nasal spray Place 2 sprays into both nostrils 3 (three) times daily.   isosorbide  mononitrate (IMDUR ) 30 MG 24 hr tablet Take 1 tablet (30 mg total) by mouth daily.   KLOR-CON  M20 20 MEQ tablet TAKE 1 TABLET BY MOUTH EVERY DAY   lidocaine  (XYLOCAINE ) 2 % solution Use as directed 15 mLs in the mouth or throat every 3 (three) hours as needed for mouth pain.   metoprolol  succinate (TOPROL  XL) 25 MG 24 hr tablet Take 0.5 tablets (12.5 mg total) by mouth as needed.   nitroGLYCERIN  (NITROSTAT ) 0.4 MG SL tablet Place 1 tablet (0.4 mg total) under the tongue every 5 (five) minutes x 3 doses as needed for chest pain (if no relief after 3rd dose, proceed to the ED for an evaluation).   rivaroxaban  (XARELTO ) 20 MG TABS tablet TAKE 1 TABLET BY MOUTH ONCE DAILY  WITH SUPPER   simvastatin  (ZOCOR ) 20 MG tablet Take 1 tablet by mouth once daily    vitamin B-12 (CYANOCOBALAMIN ) 50 MCG tablet Take 50 mcg by mouth daily.   VITAMIN D  PO Take 1 tablet by mouth daily.   No current facility-administered medications on file prior to visit.     Allergies  Allergen Reactions   Alpha-Gal Anaphylaxis   Dexilant  [Dexlansoprazole ] Anaphylaxis   Mushroom Ext Cmplx(Shiitake-Reishi-Mait) Anaphylaxis    Rapid heart rate.   Penicillins Anaphylaxis    Immediate rash, facial/tongue/throat swelling, SOB or lightheadedness with hypotension   Doxycycline Nausea And Vomiting         Review Of Systems:  Constitutional:   +  weight loss, No night sweats,  Fevers, chills, fatigue, or  lassitude.  HEENT:   No headaches,  Difficulty swallowing,  Tooth/dental problems, or  Sore throat,                No sneezing, itching, ear ache, nasal congestion, post nasal drip,   CV:  No chest pain,  Orthopnea, PND, swelling in lower extremities, anasarca, dizziness, palpitations, syncope.   GI  No heartburn, indigestion, abdominal pain, nausea, vomiting, diarrhea, change in bowel habits, loss of appetite, bloody stools.   Resp: + shortness of breath with exertion or at rest.  No excess mucus, no productive cough,  No non-productive cough,  No coughing up of blood.  No change in color of mucus.  No wheezing.  No chest wall deformity  Skin: no rash or lesions.  GU: no dysuria, change in color of urine, no urgency or frequency.  No flank pain, no hematuria   MS:  No joint pain or swelling.  No decreased range of motion.  No back pain.  Psych:  No change in mood or affect. No depression or anxiety.  No memory loss.   Vital Signs BP 128/64   Pulse (!) 56   Temp 97.8 F (36.6 C) (Oral)   Ht 6' (1.829 m)   Wt 169 lb 6.4 oz (76.8 kg)   SpO2 100%   BMI 22.97 kg/m    Physical Exam:  General- No distress,  A&Ox3, pleasant ENT: No sinus tenderness, TM clear, pale nasal mucosa, no oral exudate,no post nasal drip, no LAN Cardiac: S1, S2, regular rate and  rhythm, no murmur Chest: No wheeze/ rales/ dullness; no accessory muscle use, no nasal flaring, no sternal retractions, slightly diminished per bases Abd.: Soft Non-tender, ND, BS +, Body mass index is 22.97 kg/m.  Ext: No clubbing cyanosis, edema, no obvious deformities Neuro:  normal strength, MAE x 4, A&O x 3 appropriate Skin: No rashes, warm and dry, no obvious lesions  Psych: normal mood and behavior   Assessment/Plan  Assessment and Plan Assessment & Plan Lung nodule noted on Imaging Current every day smoker  Parotid gland PET avid with SUV of 5.2 Lung nodule with out uptake on PET Plan The PET scan shows the nodule of concern does not have PET avidity. We will do a 3 month follow up CT Chest to maintain surveillance. You will get a call to get this scheduled.  You will follow up with me 1-2 weeks after the scan to review the results. I will also order an MRI pre and post contrast to better evaluate the abnormal findings on the parotid scan.  You will get a call to get this scheduled. You will follow up 1-2 weeks after the scan to review the results. Follow up after the  MRI with Lauraine NP to review results. Call for any additional unintentional weight loss, or blood in your sputum to be seen sooner.  Please contact office for sooner follow up if symptoms do not improve or worsen or seek emergency care     I spent 25 minutes dedicated to the care of this patient on the date of this encounter to include pre-visit review of records, face-to-face time with the patient discussing conditions above, post visit ordering of testing, clinical documentation with the electronic health record, making appropriate referrals as documented, and communicating necessary information to the patient's healthcare team.      Lauraine JULIANNA Lites, NP 03/28/2024  9:40 AM

## 2024-03-30 LAB — ALPHA-GAL PANEL
Allergen Lamb IgE: 0.45 kU/L — AB
Beef IgE: 0.5 kU/L — AB
IgE (Immunoglobulin E), Serum: 114 [IU]/mL (ref 6–495)
O215-IgE Alpha-Gal: 0.68 kU/L — AB
Pork IgE: 0.31 kU/L — AB

## 2024-03-31 ENCOUNTER — Ambulatory Visit: Payer: Self-pay | Admitting: Family Medicine

## 2024-03-31 NOTE — Progress Notes (Signed)
 Can you please let this patient know that his alpha gal level is coming down nicely, however, is still in the moderate range. Please have him continue to avoid mammalian meats and let's recheck in 6 months. If low enough at that time we may be able to challenge in the clinic. Thank you. Please have him carry an epipen  set at all times.

## 2024-04-02 ENCOUNTER — Other Ambulatory Visit: Payer: Self-pay

## 2024-04-02 ENCOUNTER — Encounter (HOSPITAL_COMMUNITY): Payer: Self-pay

## 2024-04-02 ENCOUNTER — Emergency Department (HOSPITAL_COMMUNITY): Admission: EM | Admit: 2024-04-02 | Discharge: 2024-04-02 | Disposition: A | Discharge status: Home / Self Care

## 2024-04-02 ENCOUNTER — Emergency Department (HOSPITAL_COMMUNITY)

## 2024-04-02 DIAGNOSIS — I4891 Unspecified atrial fibrillation: Secondary | ICD-10-CM | POA: Insufficient documentation

## 2024-04-02 DIAGNOSIS — R531 Weakness: Secondary | ICD-10-CM | POA: Diagnosis not present

## 2024-04-02 DIAGNOSIS — M542 Cervicalgia: Secondary | ICD-10-CM | POA: Diagnosis not present

## 2024-04-02 DIAGNOSIS — R42 Dizziness and giddiness: Secondary | ICD-10-CM | POA: Diagnosis not present

## 2024-04-02 DIAGNOSIS — R55 Syncope and collapse: Secondary | ICD-10-CM | POA: Insufficient documentation

## 2024-04-02 DIAGNOSIS — R000 Tachycardia, unspecified: Secondary | ICD-10-CM | POA: Diagnosis not present

## 2024-04-02 DIAGNOSIS — R0989 Other specified symptoms and signs involving the circulatory and respiratory systems: Secondary | ICD-10-CM | POA: Diagnosis not present

## 2024-04-02 DIAGNOSIS — Z8679 Personal history of other diseases of the circulatory system: Secondary | ICD-10-CM

## 2024-04-02 DIAGNOSIS — R002 Palpitations: Secondary | ICD-10-CM | POA: Insufficient documentation

## 2024-04-02 DIAGNOSIS — Z7901 Long term (current) use of anticoagulants: Secondary | ICD-10-CM | POA: Diagnosis not present

## 2024-04-02 DIAGNOSIS — Z743 Need for continuous supervision: Secondary | ICD-10-CM | POA: Diagnosis not present

## 2024-04-02 LAB — BASIC METABOLIC PANEL WITH GFR
Anion gap: 9 (ref 5–15)
BUN: 18 mg/dL (ref 6–20)
CO2: 26 mmol/L (ref 22–32)
Calcium: 9 mg/dL (ref 8.9–10.3)
Chloride: 105 mmol/L (ref 98–111)
Creatinine, Ser: 0.97 mg/dL (ref 0.61–1.24)
GFR, Estimated: >60 mL/min (ref >60)
Glucose, Bld: 99 mg/dL (ref 70–99)
Potassium: 4.1 mmol/L (ref 3.5–5.1)
Sodium: 140 mmol/L (ref 135–145)

## 2024-04-02 LAB — CBC WITH DIFFERENTIAL/PLATELET
Abs Immature Granulocytes: 0.01 K/uL (ref 0.00–0.07)
Basophils Absolute: 0 K/uL (ref 0.0–0.1)
Basophils Relative: 0 %
Eosinophils Absolute: 0.1 K/uL (ref 0.0–0.5)
Eosinophils Relative: 1 %
HCT: 43.1 % (ref 39.0–52.0)
Hemoglobin: 14.9 g/dL (ref 13.0–17.0)
Immature Granulocytes: 0 %
Lymphocytes Relative: 19 %
Lymphs Abs: 1.4 K/uL (ref 0.7–4.0)
MCH: 32 pg (ref 26.0–34.0)
MCHC: 34.6 g/dL (ref 30.0–36.0)
MCV: 92.7 fL (ref 80.0–100.0)
Monocytes Absolute: 0.6 K/uL (ref 0.1–1.0)
Monocytes Relative: 8 %
Neutro Abs: 5.3 K/uL (ref 1.7–7.7)
Neutrophils Relative %: 72 %
Platelets: 178 K/uL (ref 150–400)
RBC: 4.65 MIL/uL (ref 4.22–5.81)
RDW: 12.2 % (ref 11.5–15.5)
WBC: 7.4 K/uL (ref 4.0–10.5)
nRBC: 0 % (ref 0.0–0.2)

## 2024-04-02 LAB — TROPONIN I (HIGH SENSITIVITY)
Troponin I (High Sensitivity): <2 ng/L (ref <18)
Troponin I (High Sensitivity): 2 ng/L (ref <18)

## 2024-04-02 LAB — MAGNESIUM: Magnesium: 2.1 mg/dL (ref 1.7–2.4)

## 2024-04-02 MED ORDER — SODIUM CHLORIDE 0.9 % IV BOLUS
500.0000 mL | Freq: Once | INTRAVENOUS | Status: AC
  Administered 2024-04-02: 500 mL via INTRAVENOUS

## 2024-04-02 NOTE — ED Triage Notes (Signed)
 Reports woke up this morning and heart rate was in the 30-40s.  Hx of afib on multec and xarelto  for afib.  But then later today his HR went high and he got dizzy.  Reports typically he gets up and his heart rate stays normal but then it started pounding this morning.  Reports pain was in his neck.  Reports his vision got blurred when he got dizzy.  Did not take multec this morning.

## 2024-04-02 NOTE — Discharge Instructions (Addendum)
 Make sure you follow-up with your cardiologist as soon as you are able and call your primary care doctor make an appointment for next week.  Return to the ER for any new or worsening symptoms.  Continue your medications as prescribed but do not restart your metoprolol .

## 2024-04-02 NOTE — ED Provider Notes (Signed)
 [place] EMERGENCY DEPARTMENT AT [facility] Provider Note   CSN: [id number] Arrival date & time: 04/02/24  1226     Patient presents with: Dizziness   Richard [name] is a 58 y.o. male.   57 year old male presents for evaluation of palpitations.  States he woke up this morning his heart rate was in the 30s.  States has been going for a few days.  States about a week ago his metoprolol  was stopped.  He states throughout the day he is doing activities and he noticed his heart rate would increase and he felt like it was racing and will get very lightheaded and have some chest pain.  He denies any shortness of breath.  States he has not had much of an appetite tight lately however.  Denies any other acute concerns.   Dizziness Associated symptoms: palpitations   Associated symptoms: no chest pain, no shortness of breath and no vomiting        Prior to Admission medications   Medication Sig Start Date End Date Taking? Authorizing Provider  acetaminophen  (TYLENOL ) 325 MG tablet Take 650 mg by mouth every 6 (six) hours as needed for moderate pain.    [provider]  albuterol  (VENTOLIN  HFA) 108 (90 Base) MCG/ACT inhaler INHALE 2 PUFFS BY MOUTH EVERY 6 HOURS AS NEEDED FOR COUGHING, WHEEZING, OR SHORTNESS OF BREATH 06/19/22   Comer Kirsch, PA-C  ALPRAZolam  (XANAX ) 0.5 MG tablet Take one twice a day and two at bedtime 02/11/24   Okey Barnie SAUNDERS, MD  amLODipine  (NORVASC ) 10 MG tablet Take 1 tablet (10 mg total) by mouth daily. 02/03/24   Zhao, Xika, NP  azelastine  (ASTELIN ) 0.1 % nasal spray Place 2 sprays into both nostrils 2 (two) times daily as needed. Use in each nostril as directed 09/24/23   Tobie Arleta SQUIBB, MD  budesonide -formoterol  (SYMBICORT ) 160-4.5 MCG/ACT inhaler Take 2 puffs first thing in am and then another 2 puffs about 12 hours later. 01/14/24   Darlean Ozell NOVAK, MD  dronedarone  (MULTAQ ) 400 MG tablet Take 1 tablet (400 mg total) by mouth 2 (two) times daily  with a meal. 05/18/23 05/18/24  Waddell Danelle ORN, MD  EPINEPHrine  0.3 mg/0.3 mL IJ SOAJ injection Inject 0.3 mg into the muscle as needed for anaphylaxis. 09/24/23   Tobie Arleta SQUIBB, MD  esomeprazole  (NEXIUM ) 40 MG capsule Take 1 capsule (40 mg total) by mouth 2 (two) times daily before a meal. 07/19/23   Shirlean Therisa ORN, NP  fluticasone  (FLONASE ) 50 MCG/ACT nasal spray Place 2 sprays into both nostrils daily. 09/24/23   Tobie Arleta SQUIBB, MD  ipratropium (ATROVENT) 0.06 % nasal spray Place 2 sprays into both nostrils 3 (three) times daily. 01/27/24   [provider]  isosorbide  mononitrate (IMDUR ) 30 MG 24 hr tablet Take 1 tablet (30 mg total) by mouth daily. 05/10/23   Strader, Laymon HERO, PA-C  KLOR-CON  M20 20 MEQ tablet TAKE 1 TABLET BY MOUTH EVERY DAY 07/19/23   Dunn, Dayna N, PA-C  lidocaine  (XYLOCAINE ) 2 % solution Use as directed 15 mLs in the mouth or throat every 3 (three) hours as needed for mouth pain. 01/15/24   Chandra Harlene LABOR, NP  metoprolol  succinate (TOPROL  XL) 25 MG 24 hr tablet Take 0.5 tablets (12.5 mg total) by mouth as needed. 03/25/24   Debera Jayson MATSU, MD  nitroGLYCERIN  (NITROSTAT ) 0.4 MG SL tablet Place 1 tablet (0.4 mg total) under the tongue every 5 (five) minutes x 3 doses as  needed for chest pain (if no relief after 3rd dose, proceed to the ED for an evaluation). 07/26/22   Waddell Danelle ORN, MD  rivaroxaban  (XARELTO ) 20 MG TABS tablet TAKE 1 TABLET BY MOUTH ONCE DAILY WITH SUPPER 10/08/23   Debera Jayson MATSU, MD  simvastatin  (ZOCOR ) 20 MG tablet Take 1 tablet by mouth once daily 06/22/23   Waddell Danelle ORN, MD  vitamin B-12 (CYANOCOBALAMIN ) 50 MCG tablet Take 50 mcg by mouth daily.    [provider]  VITAMIN D  PO Take 1 tablet by mouth daily.    [provider]    Allergies: Alpha-gal, Dexilant  [dexlansoprazole ], Mushroom ext cmplx(shiitake-reishi-mait), Penicillins, and Doxycycline    Review of Systems  Constitutional:  Negative for chills and fever.   HENT:  Negative for ear pain and sore throat.   Eyes:  Negative for pain and visual disturbance.  Respiratory:  Negative for cough and shortness of breath.   Cardiovascular:  Positive for palpitations. Negative for chest pain.  Gastrointestinal:  Negative for abdominal pain and vomiting.  Genitourinary:  Negative for dysuria and hematuria.  Musculoskeletal:  Negative for arthralgias and back pain.  Skin:  Negative for color change and rash.  Neurological:  Positive for dizziness. Negative for seizures and syncope.  All other systems reviewed and are negative.   Updated Vital Signs BP 119/68   Pulse (!) 57   Temp 98.6 F (37 C) (Oral)   Resp 13   Ht 6' (1.829 m)   Wt 76.7 kg   SpO2 98%   BMI 22.92 kg/m   Physical Exam Vitals and nursing note reviewed.  Constitutional:      General: He is not in acute distress.    Appearance: Normal appearance. He is well-developed. He is not ill-appearing.  HENT:     Head: Normocephalic and atraumatic.  Eyes:     Conjunctiva/sclera: Conjunctivae normal.  Cardiovascular:     Rate and Rhythm: Normal rate and regular rhythm.     Pulses: Normal pulses.     Heart sounds: Normal heart sounds. No murmur heard. Pulmonary:     Effort: Pulmonary effort is normal. No respiratory distress.     Breath sounds: Normal breath sounds. No stridor. No wheezing or rhonchi.  Abdominal:     General: There is no distension.     Palpations: Abdomen is soft. There is no mass.     Tenderness: There is no abdominal tenderness.     Hernia: No hernia is present.  Musculoskeletal:        General: No swelling.     Cervical back: Neck supple.  Skin:    General: Skin is warm and dry.     Capillary Refill: Capillary refill takes less than 2 seconds.  Neurological:     Mental Status: He is alert.  Psychiatric:        Mood and Affect: Mood normal.     (all labs ordered are listed, but only abnormal results are displayed) Labs Reviewed  BASIC METABOLIC PANEL  WITH GFR  CBC WITH DIFFERENTIAL/PLATELET  MAGNESIUM   TROPONIN I (HIGH SENSITIVITY)  TROPONIN I (HIGH SENSITIVITY)    EKG: None  Radiology: DG Chest 2 View Result Date: 04/02/2024 CLINICAL DATA:  Tachycardia and dizziness with neck pain. EXAM: CHEST - 2 VIEW COMPARISON:  March 06, 2024 FINDINGS: The heart size and mediastinal contours are within normal limits. Both lungs are clear. The visualized skeletal structures are unremarkable. IMPRESSION: No active cardiopulmonary disease. Electronically Signed   By:  Suzen Dials M.D.   On: 04/02/2024 13:23     Procedures   Medications Ordered in the ED  sodium chloride  0.9 % bolus 500 mL (0 mLs Intravenous Stopped 04/02/24 1737)    Clinical Course as of 04/02/24 2331  Wed Apr 02, 2024  1804 EKG not crossing over into MUSE: Provide me in the absence of cardiology and shows sinus bradycardia, rate 48, no other acute abnormalities or significant changes when compared to prior [MK]    Clinical Course User Index [MK] Gennaro Duwaine CROME, DO                                 Medical Decision Making Cardiac monitor interpretation: Sinus bradycardia, no ectopy  Patient here for heart rate that increased when he was walking and standing up earlier today.  He states he took his Xanax  and it improved.  He was able to ambulate around the emergency department and had negative orthostatics.  His heart rate stayed in the 50s to 60s on ambulation he had no recurrence of his symptoms.  I gave him some IV fluids here.  He is advised to stay on his medications as prescribed and avoid his metoprolol  for now.  Advised to plan close follow-up with cardiology and primary care doctor and otherwise return to the ER for new or worsening symptoms.  Advised to call them tomorrow morning to make an appointment within the next week.  Patient feels comfortable to plan to be discharged.  Problems Addressed: History of atrial fibrillation: chronic illness or injury Near  syncope: undiagnosed new problem with uncertain prognosis Palpitations: undiagnosed new problem with uncertain prognosis  Amount and/or Complexity of Data Reviewed External Data Reviewed: notes.    Details: Prior ED records reviewed and patient seen on 03-06-2024 for atypical chest pain Labs: ordered. Decision-making details documented in ED Course.    Details: Ordered and reviewed by me and unremarkable, troponins negative x 2 Radiology: ordered and independent interpretation performed. Decision-making details documented in ED Course.    Details: Ordered and interpreted me independently of radiology Chest x-ray: Shows no acute abnormality ECG/medicine tests: ordered and independent interpretation performed. Decision-making details documented in ED Course.    Details: Ordered and inter by me in the absence of cardiology show sinus bradycardia with no STEMI or acute change when compared to prior  Risk OTC drugs. Prescription drug management. Drug therapy requiring intensive monitoring for toxicity.     Final diagnoses:  Palpitations  History of atrial fibrillation  Near syncope    ED Discharge Orders     None          Gennaro Duwaine CROME, DO 04/02/24 2331

## 2024-04-02 NOTE — ED Notes (Signed)
 Oxygen stayed above 95% during ambulation.

## 2024-04-07 ENCOUNTER — Ambulatory Visit (HOSPITAL_COMMUNITY): Admitting: Clinical

## 2024-04-07 DIAGNOSIS — F419 Anxiety disorder, unspecified: Secondary | ICD-10-CM | POA: Diagnosis not present

## 2024-04-07 DIAGNOSIS — F32A Depression, unspecified: Secondary | ICD-10-CM

## 2024-04-07 DIAGNOSIS — F4001 Agoraphobia with panic disorder: Secondary | ICD-10-CM

## 2024-04-07 DIAGNOSIS — F4310 Post-traumatic stress disorder, unspecified: Secondary | ICD-10-CM

## 2024-04-07 DIAGNOSIS — F331 Major depressive disorder, recurrent, moderate: Secondary | ICD-10-CM

## 2024-04-07 NOTE — Progress Notes (Signed)
 Virtual Visit via Video Note   I connected with Richard [name] on 04/07/24 at  11:00 AM EDT by a video enabled telemedicine application and verified that I am speaking with the correct person using two identifiers.   Location: Patient: home Provider: office   I discussed the limitations of evaluation and management by telemedicine and the availability of in person appointments. The patient expressed understanding and agreed to proceed.     THERAPIST PROGRESS NOTE     Session Time: 11:00 AM-11:45 AM   Participation Level: Active   Behavioral Response: Casual and Alert,Anxious   Type of Therapy: Individual Therapy   Treatment Goals addressed: Mood and Anxiety   Interventions: CBT   Summary: Richard [name] is a 58 y.o. male who presents with panic disorder/depression with anxiety/and PTSD . The OPT therapist worked with the patient for his OPT treatment. The OPT therapist utilized Motivational Interviewing to assist in creating therapeutic repore. The patient in the session was engaged and work in collaboration giving feedback about his triggers and symptoms over the past few weeks. The patient spoke about his external stressor of home repair needs and identified mold and this contributing to difficulty with breathing and feels this led to his recent difficulty with breathing leading to his ED evaluation. The patient spoke about his plan to have his son come and help repair the skylight in the home and material around the skylight. The OPT therapist spoke about trying over the course of the Fall with milder temperatures to get outside more until he can get the in home damages repaired and the mold problems addressed. The OPT therapist utilized Cognitive Behavioral Therapy through cognitive restructuring as well as worked with the patient on coping strategies to assist in management of symptoms as well as reviewed sleep, eating habits, and general health. The patient continues to work on  acceptance of limited mobility based on health conditions and spoke about the impact of his physical health on his functioning. The OPT therapist worked with the patient on staying active and the patient spoke about with recent cooler weather trying to get out of the home more. The patient spoke about being in distress due to the needs of the home repairs. The patient spoke about hoping his son will be free to come help sometime before the end of the year.. The patient is going to prioritize repairs needed in the home moving into the Fall to improve the air quality and decrease negative impacts of mold.The OPT therapist overviewed upcoming health appointments listed in the patients MyChart.   Suicidal/Homicidal: Nowithout intent/plan   Therapist Response:The OPT therapist worked with the patient for the patients scheduled session. The patient was engaged in his session and gave feedback in relation to triggers, symptoms, and behavior responses over the past few weeks.The patient spoke about the impact of the repairs that need to be completed to the home and the concern that mold in the home is creating/contributing to the patient and his wife's health problems with breathing. The OPT therapist spoke about the patient and his wife staying outside for the most part of the day with better temperatures during the Fall to minimize his exposure to the in home mold that he feels is impacting his breathing. The OPT therapist worked with the patient utilizing an in session Cognitive Behavioral Therapy exercise. The patient was responsive in the session and verbalized,  I have been blowing out the filters so we can reuse them for the  air purifiers in the home..The OPT therapist worked with the patient providing ongoing psychotherapy/education and coping skills review. The patient spoke about utilizing his support network of some of the neighbors who live near him and hope his son will be able to come and help him make  repairs to the home to be able to manage the in home repairs and manage the mold to fix the environmental impacting factor on his breathing and help. The patient spoke about efforts to make good choices and not put his health at risk by pushing past his limits and staying out of the home when possible during the most part of the day.  The OPT therapist over-viewed with the patient upcoming appointments in MyChart including Cardiology, Pulmonology, and Medication Management. The OPT therapist will continue treatment work with the patient in his next scheduled session.   Plan: Return again in 2/3 weeks.   Diagnosis:      Axis I: PTSD/Panic Disorder/ Depression with anxiety  Axis II: No diagnosis      Collaboration of Care: Overview of the patients involvement in the Med Management program with Dr. Okey.   Patient/Guardian was advised Release of Information must be obtained prior to any record release in order to collaborate their care with an outside provider. Patient/Guardian was advised if they have not already done so to contact the registration department to sign all necessary forms in order for us  to release information regarding their care.    Consent: Patient/Guardian gives verbal consent for treatment and assignment of benefits for services provided during this visit. Patient/Guardian expressed understanding and agreed to proceed.    I discussed the assessment and treatment plan with the patient. The patient was provided an opportunity to ask questions and all were answered. The patient agreed with the plan and demonstrated an understanding of the instructions.   The patient was advised to call back or seek an in-person evaluation if the symptoms worsen or if the condition fails to improve as anticipated.   I provided 45 minutes of non-face-to-face time during this encounter.   Richard ONEIDA Pepper, LCSW    04/07/2024

## 2024-04-08 ENCOUNTER — Telehealth: Payer: Self-pay | Admitting: Gastroenterology

## 2024-04-08 MED ORDER — ESOMEPRAZOLE MAGNESIUM 40 MG PO CPDR: 40.0000 mg | DELAYED_RELEASE_CAPSULE | Freq: Two times a day (BID) | ORAL | 3 refills | Status: DC

## 2024-04-08 MED ORDER — ESOMEPRAZOLE MAGNESIUM 40 MG PO CPDR: 40.0000 mg | DELAYED_RELEASE_CAPSULE | Freq: Two times a day (BID) | ORAL | 3 refills | Status: AC

## 2024-04-08 NOTE — Telephone Encounter (Signed)
 Completed.

## 2024-04-08 NOTE — Telephone Encounter (Signed)
 Patients wife called and said a prescription was sent to wrong pharmacy for a generic for Nexium  pt wife states it is Psych[facility], forensic and pt cannot take a generic.

## 2024-04-08 NOTE — Telephone Encounter (Signed)
 Patient's wife called to say that he needs a refill on the Nexium  and it should be a 3 month supply so the insurance will cover it.

## 2024-04-14 ENCOUNTER — Emergency Department (HOSPITAL_COMMUNITY)

## 2024-04-14 ENCOUNTER — Ambulatory Visit: Admitting: Cardiology

## 2024-04-14 ENCOUNTER — Encounter (HOSPITAL_COMMUNITY): Payer: Self-pay | Admitting: Emergency Medicine

## 2024-04-14 ENCOUNTER — Observation Stay (HOSPITAL_COMMUNITY)
Admission: EM | Admit: 2024-04-14 | Discharge: 2024-04-15 | Disposition: A | Discharge status: Home / Self Care | Attending: Internal Medicine | Admitting: Internal Medicine

## 2024-04-14 ENCOUNTER — Other Ambulatory Visit: Payer: Self-pay

## 2024-04-14 DIAGNOSIS — E119 Type 2 diabetes mellitus without complications: Secondary | ICD-10-CM | POA: Diagnosis not present

## 2024-04-14 DIAGNOSIS — I10 Essential (primary) hypertension: Secondary | ICD-10-CM | POA: Diagnosis not present

## 2024-04-14 DIAGNOSIS — M542 Cervicalgia: Secondary | ICD-10-CM | POA: Insufficient documentation

## 2024-04-14 DIAGNOSIS — Z7901 Long term (current) use of anticoagulants: Secondary | ICD-10-CM | POA: Diagnosis not present

## 2024-04-14 DIAGNOSIS — G4733 Obstructive sleep apnea (adult) (pediatric): Secondary | ICD-10-CM | POA: Diagnosis not present

## 2024-04-14 DIAGNOSIS — Z79899 Other long term (current) drug therapy: Secondary | ICD-10-CM | POA: Insufficient documentation

## 2024-04-14 DIAGNOSIS — R002 Palpitations: Secondary | ICD-10-CM | POA: Diagnosis not present

## 2024-04-14 DIAGNOSIS — G4489 Other headache syndrome: Secondary | ICD-10-CM | POA: Diagnosis not present

## 2024-04-14 DIAGNOSIS — I2510 Atherosclerotic heart disease of native coronary artery without angina pectoris: Secondary | ICD-10-CM | POA: Insufficient documentation

## 2024-04-14 DIAGNOSIS — R079 Chest pain, unspecified: Principal | ICD-10-CM | POA: Diagnosis present

## 2024-04-14 DIAGNOSIS — R001 Bradycardia, unspecified: Principal | ICD-10-CM | POA: Insufficient documentation

## 2024-04-14 DIAGNOSIS — I4891 Unspecified atrial fibrillation: Secondary | ICD-10-CM | POA: Diagnosis not present

## 2024-04-14 DIAGNOSIS — F17200 Nicotine dependence, unspecified, uncomplicated: Secondary | ICD-10-CM | POA: Diagnosis not present

## 2024-04-14 DIAGNOSIS — Z743 Need for continuous supervision: Secondary | ICD-10-CM | POA: Diagnosis not present

## 2024-04-14 DIAGNOSIS — J45909 Unspecified asthma, uncomplicated: Secondary | ICD-10-CM | POA: Insufficient documentation

## 2024-04-14 DIAGNOSIS — R0789 Other chest pain: Secondary | ICD-10-CM | POA: Diagnosis not present

## 2024-04-14 DIAGNOSIS — R208 Other disturbances of skin sensation: Secondary | ICD-10-CM | POA: Diagnosis not present

## 2024-04-14 LAB — BASIC METABOLIC PANEL WITH GFR
Anion gap: 12 (ref 5–15)
BUN: 18 mg/dL (ref 6–20)
CO2: 21 mmol/L — ABNORMAL LOW (ref 22–32)
Calcium: 8.4 mg/dL — ABNORMAL LOW (ref 8.9–10.3)
Chloride: 102 mmol/L (ref 98–111)
Creatinine, Ser: 0.78 mg/dL (ref 0.61–1.24)
GFR, Estimated: >60 mL/min (ref >60)
Glucose, Bld: 150 mg/dL — ABNORMAL HIGH (ref 70–99)
Potassium: 3.5 mmol/L (ref 3.5–5.1)
Sodium: 135 mmol/L (ref 135–145)

## 2024-04-14 LAB — CBC WITH DIFFERENTIAL/PLATELET
Abs Immature Granulocytes: 0.01 K/uL (ref 0.00–0.07)
Basophils Absolute: 0 K/uL (ref 0.0–0.1)
Basophils Relative: 0 %
Eosinophils Absolute: 0.1 K/uL (ref 0.0–0.5)
Eosinophils Relative: 2 %
HCT: 41 % (ref 39.0–52.0)
Hemoglobin: 14.4 g/dL (ref 13.0–17.0)
Immature Granulocytes: 0 %
Lymphocytes Relative: 17 %
Lymphs Abs: 1.1 K/uL (ref 0.7–4.0)
MCH: 32.3 pg (ref 26.0–34.0)
MCHC: 35.1 g/dL (ref 30.0–36.0)
MCV: 91.9 fL (ref 80.0–100.0)
Monocytes Absolute: 0.5 K/uL (ref 0.1–1.0)
Monocytes Relative: 7 %
Neutro Abs: 4.9 K/uL (ref 1.7–7.7)
Neutrophils Relative %: 74 %
Platelets: 166 K/uL (ref 150–400)
RBC: 4.46 MIL/uL (ref 4.22–5.81)
RDW: 12.1 % (ref 11.5–15.5)
WBC: 6.6 K/uL (ref 4.0–10.5)
nRBC: 0 % (ref 0.0–0.2)

## 2024-04-14 LAB — TROPONIN I (HIGH SENSITIVITY)
Troponin I (High Sensitivity): <2 ng/L (ref <18)
Troponin I (High Sensitivity): <2 ng/L (ref <18)

## 2024-04-14 LAB — CBG MONITORING, ED: Glucose-Capillary: 181 mg/dL — ABNORMAL HIGH (ref 70–99)

## 2024-04-14 LAB — MAGNESIUM: Magnesium: 2 mg/dL (ref 1.7–2.4)

## 2024-04-14 MED ORDER — NITROGLYCERIN 2 % TD OINT
1.0000 [in_us] | TOPICAL_OINTMENT | Freq: Once | TRANSDERMAL | Status: AC
  Administered 2024-04-14: 1 [in_us] via TOPICAL
  Filled 2024-04-14: qty 1

## 2024-04-14 MED ORDER — FENTANYL CITRATE (PF) 100 MCG/2ML IJ SOLN
50.0000 ug | Freq: Once | INTRAMUSCULAR | Status: AC
  Administered 2024-04-14: 50 ug via INTRAVENOUS
  Filled 2024-04-14: qty 2

## 2024-04-14 NOTE — ED Triage Notes (Signed)
 Pt c/o chest pain and palpitations. Ems states pt is going in and out of afib.

## 2024-04-14 NOTE — ED Provider Notes (Signed)
 [place] EMERGENCY DEPARTMENT AT [facility] Provider Note   CSN: [id number] Arrival date & time: 04/14/24  1827     Patient presents with: Chest Pain   Richard [name] is a 58 y.o. male.    Chest Pain Associated symptoms: fatigue, palpitations and weakness (Generalized)   Patient presents for chest pain and palpitations.  Medical history includes COPD, HLD, GERD, anxiety, depression, HTN, CAD, atrial fibrillation, DM, CVA, panic attacks.  This morning, he noticed palpitations.  He describes these as his heart beating slow but irregular.  He felt generalized weakness.  Because of this, he had less activity today.  He took his wife to the store and while waiting for her, had worsening of his palpitations.  EMS was called.  While in ambulance, patient had resolution of the palpitations but did have onset of a aching pain in the left side of his chest with radiation in the left side of his neck and face.  Patient took his dose of Xarelto  this morning.  He did take 4 baby aspirin  at home.  He took half of a metoprolol  tablet that he is prescribed as needed.     Prior to Admission medications   Medication Sig Start Date End Date Taking? Authorizing Provider  albuterol  (VENTOLIN  HFA) 108 (90 Base) MCG/ACT inhaler INHALE 2 PUFFS BY MOUTH EVERY 6 HOURS AS NEEDED FOR COUGHING, WHEEZING, OR SHORTNESS OF BREATH Patient taking differently: Inhale 2 puffs into the lungs every 6 (six) hours as needed for wheezing or shortness of breath (coughing). INHALE 2 PUFFS BY MOUTH EVERY 6 HOURS AS NEEDED FOR COUGHING, WHEEZING, OR SHORTNESS OF BREATH 06/19/22  Yes Comer Kirsch, PA-C  ALPRAZolam  (XANAX ) 0.5 MG tablet Take one twice a day and two at bedtime Patient taking differently: Take 0.5-1 mg by mouth 3 (three) times daily. Take 1 tablet by mouth in the morning and in the evening, then take 2 tablets at bedtime. 02/11/24  Yes Okey Barnie SAUNDERS, MD  amLODipine  (NORVASC ) 10 MG tablet Take 1 tablet  (10 mg total) by mouth daily. 02/03/24  Yes Zhao, Xika, NP  budesonide -formoterol  (SYMBICORT ) 160-4.5 MCG/ACT inhaler Take 2 puffs first thing in am and then another 2 puffs about 12 hours later. 01/14/24  Yes Darlean Ozell NOVAK, MD  dronedarone  (MULTAQ ) 400 MG tablet Take 1 tablet (400 mg total) by mouth 2 (two) times daily with a meal. 05/18/23 05/18/24 Yes Waddell Danelle ORN, MD  EPINEPHrine  0.3 mg/0.3 mL IJ SOAJ injection Inject 0.3 mg into the muscle as needed for anaphylaxis. 09/24/23  Yes Tobie Arleta SQUIBB, MD  esomeprazole  (NEXIUM ) 40 MG capsule Take 1 capsule (40 mg total) by mouth 2 (two) times daily before a meal. 04/08/24  Yes Shirlean Therisa ORN, NP  isosorbide  mononitrate (IMDUR ) 30 MG 24 hr tablet Take 1 tablet (30 mg total) by mouth daily. Patient taking differently: Take 30 mg by mouth daily as needed (chest discomfort). 05/10/23  Yes Strader, Laymon HERO, PA-C  KLOR-CON  M20 20 MEQ tablet TAKE 1 TABLET BY MOUTH EVERY DAY 07/19/23  Yes Dunn, Dayna N, PA-C  lidocaine  (XYLOCAINE ) 2 % solution Use as directed 15 mLs in the mouth or throat every 3 (three) hours as needed for mouth pain. 01/15/24  Yes Chandra Harlene LABOR, NP  metoprolol  succinate (TOPROL  XL) 25 MG 24 hr tablet Take 0.5 tablets (12.5 mg total) by mouth as needed. Patient taking differently: Take 12.5 mg by mouth as needed (high heartrate). 03/25/24  Yes Debera Savant  G, MD  nitroGLYCERIN  (NITROSTAT ) 0.4 MG SL tablet Place 1 tablet (0.4 mg total) under the tongue every 5 (five) minutes x 3 doses as needed for chest pain (if no relief after 3rd dose, proceed to the ED for an evaluation). 07/26/22  Yes Waddell Danelle ORN, MD  rivaroxaban  (XARELTO ) 20 MG TABS tablet TAKE 1 TABLET BY MOUTH ONCE DAILY WITH SUPPER Patient taking differently: Take 20 mg by mouth daily. 10/08/23  Yes Debera Jayson MATSU, MD  simvastatin  (ZOCOR ) 20 MG tablet Take 1 tablet by mouth once daily Patient taking differently: Take 20 mg by mouth daily at 6 PM. 06/22/23  Yes Waddell Danelle ORN, MD  vitamin B-12 (CYANOCOBALAMIN ) 50 MCG tablet Take 50 mcg by mouth daily.   Yes [provider]  VITAMIN D  PO Take 1 tablet by mouth daily.   Yes [provider]    Allergies: Alpha-gal, Dexilant  [dexlansoprazole ], Mushroom ext cmplx(shiitake-reishi-mait), Penicillins, and Doxycycline    Review of Systems  Constitutional:  Positive for fatigue.  Cardiovascular:  Positive for chest pain and palpitations.  Neurological:  Positive for weakness (Generalized).  All other systems reviewed and are negative.   Updated Vital Signs BP 105/64   Pulse (!) 55   Temp 97.9 F (36.6 C) (Oral)   Resp 15   Ht 6' (1.829 m)   Wt 76.7 kg   SpO2 93%   BMI 22.93 kg/m   Physical Exam Vitals and nursing note reviewed.  Constitutional:      General: He is not in acute distress.    Appearance: He is well-developed. He is not ill-appearing, toxic-appearing or diaphoretic.  HENT:     Head: Normocephalic and atraumatic.  Eyes:     Conjunctiva/sclera: Conjunctivae normal.  Cardiovascular:     Rate and Rhythm: Normal rate and regular rhythm.     Heart sounds: No murmur heard. Pulmonary:     Effort: Pulmonary effort is normal. No tachypnea or respiratory distress.     Breath sounds: Normal breath sounds. No decreased breath sounds, wheezing, rhonchi or rales.  Chest:     Chest wall: No tenderness.  Abdominal:     Palpations: Abdomen is soft.     Tenderness: There is no abdominal tenderness.  Musculoskeletal:        General: No swelling. Normal range of motion.     Cervical back: Normal range of motion and neck supple.  Skin:    General: Skin is warm and dry.     Coloration: Skin is not cyanotic or pale.  Neurological:     General: No focal deficit present.     Mental Status: He is alert and oriented to person, place, and time.  Psychiatric:        Mood and Affect: Mood normal.        Behavior: Behavior normal.     (all labs ordered are listed, but only abnormal  results are displayed) Labs Reviewed  BASIC METABOLIC PANEL WITH GFR - Abnormal; Notable for the following components:      Result Value   CO2 21 (*)    Glucose, Bld 150 (*)    Calcium  8.4 (*)    All other components within normal limits  CBG MONITORING, ED - Abnormal; Notable for the following components:   Glucose-Capillary 181 (*)    All other components within normal limits  MAGNESIUM   CBC WITH DIFFERENTIAL/PLATELET  TROPONIN I (HIGH SENSITIVITY)  TROPONIN I (HIGH SENSITIVITY)    EKG: EKG Interpretation Date/Time:  Monday April 14 2024 18:45:14 EDT Ventricular Rate:  61 PR Interval:  182 QRS Duration:  95 QT Interval:  419 QTC Calculation: 422 R Axis:   79  Text Interpretation: Sinus rhythm Consider left atrial enlargement Confirmed by Melvenia Motto [phone]) on 04/14/2024 6:54:31 PM  Radiology: [facility] Chest Portable 1 View Result Date: 04/14/2024 EXAM: 1 VIEW(S) XRAY OF THE CHEST 04/14/2024 07:00:00 PM COMPARISON: 04/02/2024 CLINICAL HISTORY: Per triage: Pt c/o chest pain and palpitations. Ems states pt is going in and out of afib. Pt stated pain travels to left side of neck and has a burning like sensation in his left hand/ arm. FINDINGS: LUNGS AND PLEURA: No focal pulmonary opacity. No pulmonary edema. No pleural effusion. No pneumothorax. HEART AND MEDIASTINUM: No acute abnormality of the cardiac and mediastinal silhouettes. BONES AND SOFT TISSUES: No acute osseous abnormality. IMPRESSION: 1. No acute abnormalities. Electronically signed by: Norman Gatlin MD 04/14/2024 07:12 PM EDT RP Workstation: [id number]     Procedures   Medications Ordered in the ED  fentaNYL  (SUBLIMAZE ) injection 50 mcg (50 mcg Intravenous Given 04/14/24 1913)  nitroGLYCERIN  (NITROGLYN) 2 % ointment 1 inch (1 inch Topical Given 04/14/24 1914)                                    Medical Decision Making Amount and/or Complexity of Data Reviewed Labs: ordered. Radiology:  ordered.  Risk Prescription drug management. Decision regarding hospitalization.   This patient presents to the ED for concern of chest pain, this involves an extensive number of treatment options, and is a complaint that carries with it a high risk of complications and morbidity.  The differential diagnosis includes ACS, pericarditis, GERD, musculoskeletal etiology   Co morbidities / Chronic conditions that complicate the patient evaluation  COPD, HLD, GERD, anxiety, depression, HTN, CAD, atrial fibrillation, DM, CVA, panic attacks   Additional history obtained:  Additional history obtained from EMR External records from outside source obtained and reviewed including N/A   Lab Tests:  I Ordered, and personally interpreted labs.  The pertinent results include:  normal hemoglobin, no leukocytosis, normal kidney function, normal electrolytes, normal troponins x 2   Imaging Studies ordered:  I ordered imaging studies including chest x-ray I independently visualized and interpreted imaging which showed no acute findings I agree with the radiologist interpretation   Cardiac Monitoring: / EKG:  The patient was maintained on a cardiac monitor.  I personally viewed and interpreted the cardiac monitored which showed an underlying rhythm of: Sinus rhythm   Problem List / ED Course / Critical interventions / Medication management  Patient presenting for chest pain and palpitations.  He took metoprolol  and 4 baby aspirin  prior to arrival.  He did take his Xarelto  this morning, which he is prescribed for history of atrial fibrillation.  On arrival, he endorses an ache in the left side of his chest that radiates to left side of neck and face.  Per chart review, he is followed by Dr. Debera.  He underwent cardiac cath in 2017 which showed nonobstructive CAD.  CTA in October 2024 showed moderate stenosis.  EKG does not show any concerning ST segment elevations.  Patient placed on monitor  and workup was initiated.  On monitor, he maintained normal sinus rhythm.  After 50 mcg of fentanyl  and nitro glycerin ointment, patient chest pain resolved.  His initial troponins were reassuring.  I spoke with cardiologist on-call, Dr.  Salah, who recommends admission to [facility] for stress test.  Repeat troponin was normal.  Patient was admitted for further management. I ordered medication including fentanyl  and NTG for chest pain Reevaluation of the patient after these medicines showed that the patient improved I have reviewed the patients home medicines and have made adjustments as needed   Consultations Obtained:  I requested consultation with the neurologist, Dr. Otelia,  and discussed lab and imaging findings as well as pertinent plan - they recommend: Admission to [facility] for stress test.   Social Determinants of Health:  Lives independently     Final diagnoses:  Chest pain, unspecified type    ED Discharge Orders     None          Melvenia Motto, MD 04/14/24 2214

## 2024-04-15 ENCOUNTER — Telehealth: Payer: Self-pay | Admitting: *Deleted

## 2024-04-15 ENCOUNTER — Other Ambulatory Visit (INDEPENDENT_AMBULATORY_CARE_PROVIDER_SITE_OTHER)

## 2024-04-15 DIAGNOSIS — R002 Palpitations: Secondary | ICD-10-CM

## 2024-04-15 DIAGNOSIS — Z7901 Long term (current) use of anticoagulants: Secondary | ICD-10-CM

## 2024-04-15 DIAGNOSIS — I2510 Atherosclerotic heart disease of native coronary artery without angina pectoris: Secondary | ICD-10-CM | POA: Diagnosis not present

## 2024-04-15 DIAGNOSIS — R001 Bradycardia, unspecified: Secondary | ICD-10-CM

## 2024-04-15 DIAGNOSIS — R079 Chest pain, unspecified: Secondary | ICD-10-CM | POA: Diagnosis not present

## 2024-04-15 DIAGNOSIS — I480 Paroxysmal atrial fibrillation: Secondary | ICD-10-CM | POA: Diagnosis not present

## 2024-04-15 LAB — CBC
HCT: 39.1 % (ref 39.0–52.0)
Hemoglobin: 13.6 g/dL (ref 13.0–17.0)
MCH: 32.3 pg (ref 26.0–34.0)
MCHC: 34.8 g/dL (ref 30.0–36.0)
MCV: 92.9 fL (ref 80.0–100.0)
Platelets: 172 K/uL (ref 150–400)
RBC: 4.21 MIL/uL — ABNORMAL LOW (ref 4.22–5.81)
RDW: 12.3 % (ref 11.5–15.5)
WBC: 6.8 K/uL (ref 4.0–10.5)
nRBC: 0 % (ref 0.0–0.2)

## 2024-04-15 LAB — BASIC METABOLIC PANEL WITH GFR
Anion gap: 8 (ref 5–15)
BUN: 18 mg/dL (ref 6–20)
CO2: 27 mmol/L (ref 22–32)
Calcium: 8.5 mg/dL — ABNORMAL LOW (ref 8.9–10.3)
Chloride: 104 mmol/L (ref 98–111)
Creatinine, Ser: 0.84 mg/dL (ref 0.61–1.24)
GFR, Estimated: >60 mL/min (ref >60)
Glucose, Bld: 95 mg/dL (ref 70–99)
Potassium: 3.7 mmol/L (ref 3.5–5.1)
Sodium: 139 mmol/L (ref 135–145)

## 2024-04-15 LAB — TSH: TSH: 0.747 u[IU]/mL (ref 0.350–4.500)

## 2024-04-15 LAB — HIV ANTIBODY (ROUTINE TESTING W REFLEX): HIV Screen 4th Generation wRfx: NONREACTIVE

## 2024-04-15 LAB — MAGNESIUM: Magnesium: 2.1 mg/dL (ref 1.7–2.4)

## 2024-04-15 LAB — TROPONIN I (HIGH SENSITIVITY): Troponin I (High Sensitivity): <2 ng/L (ref <18)

## 2024-04-15 LAB — PHOSPHORUS: Phosphorus: 4.9 mg/dL — ABNORMAL HIGH (ref 2.5–4.6)

## 2024-04-15 MED ORDER — ACETAMINOPHEN 500 MG PO TABS: 1000.0000 mg | ORAL_TABLET | Freq: Four times a day (QID) | ORAL | Status: DC | PRN

## 2024-04-15 MED ORDER — FLUTICASONE FUROATE-VILANTEROL 200-25 MCG/ACT IN AEPB: 1.0000 | INHALATION_SPRAY | Freq: Every day | RESPIRATORY_TRACT | Status: DC

## 2024-04-15 MED ORDER — ISOSORBIDE DINITRATE 5 MG PO TABS
5.0000 mg | ORAL_TABLET | Freq: Three times a day (TID) | ORAL | Status: DC
  Filled 2024-04-15 (×5): qty 1

## 2024-04-15 MED ORDER — AMLODIPINE BESYLATE 5 MG PO TABS: 10.0000 mg | ORAL_TABLET | Freq: Every day | ORAL | Status: DC

## 2024-04-15 MED ORDER — ALPRAZOLAM 1 MG PO TABS: 1.0000 mg | ORAL_TABLET | Freq: Every day | ORAL | Status: DC

## 2024-04-15 MED ORDER — ALBUTEROL SULFATE (2.5 MG/3ML) 0.083% IN NEBU: 2.5000 mg | INHALATION_SOLUTION | RESPIRATORY_TRACT | Status: DC | PRN

## 2024-04-15 MED ORDER — RIVAROXABAN 20 MG PO TABS
20.0000 mg | ORAL_TABLET | Freq: Every day | ORAL | Status: DC
  Administered 2024-04-15: 20 mg via ORAL
  Filled 2024-04-15: qty 1

## 2024-04-15 MED ORDER — MELATONIN 3 MG PO TABS: 6.0000 mg | ORAL_TABLET | Freq: Every evening | ORAL | Status: DC | PRN

## 2024-04-15 MED ORDER — SODIUM CHLORIDE 0.9% FLUSH
3.0000 mL | Freq: Two times a day (BID) | INTRAVENOUS | Status: DC
  Administered 2024-04-15: 3 mL via INTRAVENOUS

## 2024-04-15 MED ORDER — ONDANSETRON HCL 4 MG/2ML IJ SOLN: 4.0000 mg | Freq: Four times a day (QID) | INTRAMUSCULAR | Status: DC | PRN

## 2024-04-15 MED ORDER — METOPROLOL TARTRATE 25 MG PO TABS
12.5000 mg | ORAL_TABLET | Freq: Two times a day (BID) | ORAL | 1 refills | Status: DC
Start: 2024-04-15 — End: 2024-05-12

## 2024-04-15 MED ORDER — POLYETHYLENE GLYCOL 3350 17 G PO PACK: 17.0000 g | PACK | Freq: Every day | ORAL | Status: DC | PRN

## 2024-04-15 MED ORDER — SIMVASTATIN 20 MG PO TABS: 20.0000 mg | ORAL_TABLET | Freq: Every day | ORAL | Status: DC

## 2024-04-15 MED ORDER — ALPRAZOLAM 0.5 MG PO TABS: 0.5000 mg | ORAL_TABLET | Freq: Three times a day (TID) | ORAL | Status: DC

## 2024-04-15 MED ORDER — DRONEDARONE HCL 400 MG PO TABS: 400.0000 mg | ORAL_TABLET | Freq: Two times a day (BID) | ORAL | Status: DC

## 2024-04-15 MED ORDER — ALPRAZOLAM 0.5 MG PO TABS
0.5000 mg | ORAL_TABLET | Freq: Two times a day (BID) | ORAL | Status: DC
  Administered 2024-04-15: 0.5 mg via ORAL
  Filled 2024-04-15: qty 1

## 2024-04-15 NOTE — Consult Note (Signed)
 Cardiology Consultation   Patient ID: Richard [name] MRN: [id number]; DOB: [birth year]  Admit date: 04/14/2024 Date of Consult: 04/15/2024  PCP:  Edman Meade PEDLAR, FNP   [facility] Providers Cardiologist:  Jayson Sierras, MD  Electrophysiologist:  Danelle Birmingham, MD     Patient Profile: Richard [name] is a 58 y.o. male with a hx of CAD (nonobstructive disease by cath in 07/2015, low-risk NST in 12/2020, Coronary CTA in 04/2023 showing 50-69% scattered stenoses but FFR only positive along distal vessels and medical management recommended unless refractory symptoms), carotid artery disease, paroxysmal Afib on AC, asthma, HTN, HLD, DM2, GERD, depression who is being seen 04/15/2024 for the evaluation of chest pain at the request of Dr. Maree.  History of Present Illness: Richard [name] was last seen in heart care OV 03/25/2024 for follow-up.  Reported symptoms of palpitations at times, otherwise doing well from cardiac standpoint.  Switch from Toprol  XL 6.25 mg daily to 12.5 mg as needed for palpitations.  Continued on Xarelto  20 mg daily, Zocor  20 mg daily, NTG as needed, amlodipine  10 mg daily, Multaq  400 mg BID.   Presented to [facility] ED for 9/29 for chest pain. Took ASA 324 mg at home.  K 3.5 now 3.7, Cr 0.78 now 0.84, MG 2.0 now 2.1, Phosphorus 4.9, TN < 2 x2, CBC WNL, TSH WNL,  EKG: NSR, HR 61 (no ischemic changes since previous EKG) CXR w/ no acute findings.  Treated with NTG ointment and IV fentanyl  patient Consulted cardiology to rule out ACS, although suspect cervical/thoracic disease considering radicular symptoms and MSK with recent pain near the shoulder after hyperextending.  On interview, patient is accompanied by his wife.  Noted on Friday he was able to paint the ceiling without any chest pain and noted having to move furniture around as well.  Reports chest pain started on Saturday while at rest but worsened yesterday to him feeling weak, no energy and like his A-fib was back.   Chest pain described as constant tightness, located on left side, no exacerbating or relieving factors, worse than previous CP episodes.  Some relief with morphine and NTG but still present currently. Patient went to take the trash to the dumpster but the further he walked he felt worse and felt palpitations like heart pounding.  Took 2 Xanax  with no relief.  Home HR of 113 with O2 98%.  Also took metoprolol  12.5 mg with no relief.  After heart pounding stopped, felt pain shooting up neck and down the left arm.  Notes in the last month, chest pain associated with exertions has frequency has increased to nearly every other day.  Patient has not taken NTG at home or Isordil . Denies N/V, diaphoresis, edema, syncope.  Reports daily medication compliance.  Smokes 4 to 5 cigarettes/day.  Denies EtOH or drugs  Past Medical History:  Diagnosis Date   Arthritis    Asthma    CAD (coronary artery disease)    a. Cardiac cath 07/2015 showed 65% distal Cx, 20% mid-distal LAD, 20% prox-distal RCA, EF 60%, EDP .   Colitis 1990   COPD (chronic obstructive pulmonary disease) (HCC)    Depression    Dysrhythmia    Essential hypertension    Fatty liver    Gastric ulcer 2003; 2012   2003: + esophagitis; negative H.pylori serology  2012: Dr. Harvey, mild gastritis, Bravo PH probe placement, negative H.pylori   GERD (gastroesophageal reflux disease)    Hepatic steatosis    History of  hiatal hernia    Hyperlipemia    Overweight    Panic attacks    Paroxysmal atrial fibrillation (HCC)    Pulmonary nodules    Stroke (HCC)    TIA (transient ischemic attack)    Type II diabetes mellitus (HCC)     Past Surgical History:  Procedure Laterality Date   BALLOON DILATION N/A 07/25/2021   Procedure: BALLOON DILATION;  Surgeon: Cindie Carlin POUR, DO;  Location: [facility];  Service: Endoscopy;  Laterality: N/A;   BIOPSY  07/25/2021   Procedure: BIOPSY;  Surgeon: Cindie Carlin POUR, DO;  Location: [facility];   Service: Endoscopy;;   BIOPSY  12/04/2022   Procedure: BIOPSY;  Surgeon: Cindie Carlin POUR, DO;  Location: [facility];  Service: Endoscopy;;   BRAVO PH STUDY  05/03/2011   DOQ:Fpoi gastritis/normal esophagus and duodenum   CARDIAC CATHETERIZATION  1990s X 1; 2005; 08/12/2015   CARDIAC CATHETERIZATION N/A 08/12/2015   Procedure: Left Heart Cath and Coronary Angiography;  Surgeon: Victory LELON Sharps, MD; LAD 20%, CFX 65%, RCA 20%, EF 60%    COLONOSCOPY  1990   COLONOSCOPY WITH PROPOFOL  N/A 11/21/2016   Dr. Harvey: non-thrombosed external hemorrhoids, one 6 mm polyp (polypoid lesion), internal hemorrhoids. TI Normal. 10 years screening   ESOPHAGOGASTRODUODENOSCOPY  05/03/2011   DOQ:fpoi gastritis   ESOPHAGOGASTRODUODENOSCOPY (EGD) WITH PROPOFOL  N/A 07/25/2021   Procedure: ESOPHAGOGASTRODUODENOSCOPY (EGD) WITH PROPOFOL ;  Surgeon: Cindie Carlin POUR, DO;  [facility]: [facility];  Service: Endoscopy;  Laterality: N/A;  1:30pm   ESOPHAGOGASTRODUODENOSCOPY (EGD) WITH PROPOFOL  N/A 12/04/2022   Procedure: ESOPHAGOGASTRODUODENOSCOPY (EGD) WITH PROPOFOL ;  Surgeon: Cindie Carlin POUR, DO;  Location: [facility];  Service: Endoscopy;  Laterality: N/A;  8:00AM;ASA 3   NECK MASS EXCISION Right    done in dr's office; behind right ear/side of ncek   POLYPECTOMY  11/21/2016   Procedure: POLYPECTOMY;  Surgeon: Harvey Margo CROME, MD;  Location: [facility];  Service: Endoscopy;;  descending colon polyp   SHOULDER ARTHROSCOPY W/ ROTATOR CUFF REPAIR Right 2006   acromioclavicular joint arthrosis     Home Medications:  Prior to Admission medications   Medication Sig Start Date End Date Taking? Authorizing Provider  albuterol  (VENTOLIN  HFA) 108 (90 Base) MCG/ACT inhaler INHALE 2 PUFFS BY MOUTH EVERY 6 HOURS AS NEEDED FOR COUGHING, WHEEZING, OR SHORTNESS OF BREATH Patient taking differently: Inhale 2 puffs into the lungs every 6 (six) hours as needed for wheezing or shortness of breath (coughing). INHALE 2 PUFFS BY MOUTH  EVERY 6 HOURS AS NEEDED FOR COUGHING, WHEEZING, OR SHORTNESS OF BREATH 06/19/22  Yes Comer Kirsch, PA-C  ALPRAZolam  (XANAX ) 0.5 MG tablet Take one twice a day and two at bedtime Patient taking differently: Take 0.5-1 mg by mouth 3 (three) times daily. Take 1 tablet by mouth in the morning and in the evening, then take 2 tablets at bedtime. 02/11/24  Yes Okey Barnie SAUNDERS, MD  amLODipine  (NORVASC ) 10 MG tablet Take 1 tablet (10 mg total) by mouth daily. 02/03/24  Yes Zhao, Xika, NP  budesonide -formoterol  (SYMBICORT ) 160-4.5 MCG/ACT inhaler Take 2 puffs first thing in am and then another 2 puffs about 12 hours later. 01/14/24  Yes Darlean Ozell NOVAK, MD  dronedarone  (MULTAQ ) 400 MG tablet Take 1 tablet (400 mg total) by mouth 2 (two) times daily with a meal. 05/18/23 05/18/24 Yes Waddell Danelle LELON, MD  EPINEPHrine  0.3 mg/0.3 mL IJ SOAJ injection Inject 0.3 mg into the muscle as needed for anaphylaxis. 09/24/23  Yes Tobie Arleta SQUIBB, MD  esomeprazole  (NEXIUM ) 40 MG capsule Take 1 capsule (40 mg total) by mouth 2 (two) times daily before a meal. 04/08/24  Yes Shirlean Therisa ORN, NP  isosorbide  mononitrate (IMDUR ) 30 MG 24 hr tablet Take 1 tablet (30 mg total) by mouth daily. Patient taking differently: Take 30 mg by mouth daily as needed (chest discomfort). 05/10/23  Yes Strader, Laymon HERO, PA-C  KLOR-CON  M20 20 MEQ tablet TAKE 1 TABLET BY MOUTH EVERY DAY 07/19/23  Yes Dunn, Dayna N, PA-C  lidocaine  (XYLOCAINE ) 2 % solution Use as directed 15 mLs in the mouth or throat every 3 (three) hours as needed for mouth pain. 01/15/24  Yes Chandra Raisin A, NP  metoprolol  succinate (TOPROL  XL) 25 MG 24 hr tablet Take 0.5 tablets (12.5 mg total) by mouth as needed. Patient taking differently: Take 12.5 mg by mouth as needed (high heartrate). 03/25/24  Yes Debera Jayson MATSU, MD  nitroGLYCERIN  (NITROSTAT ) 0.4 MG SL tablet Place 1 tablet (0.4 mg total) under the tongue every 5 (five) minutes x 3 doses as needed for chest pain (if no  relief after 3rd dose, proceed to the ED for an evaluation). 07/26/22  Yes Waddell Danelle ORN, MD  rivaroxaban  (XARELTO ) 20 MG TABS tablet TAKE 1 TABLET BY MOUTH ONCE DAILY WITH SUPPER Patient taking differently: Take 20 mg by mouth daily. 10/08/23  Yes Debera Jayson MATSU, MD  simvastatin  (ZOCOR ) 20 MG tablet Take 1 tablet by mouth once daily Patient taking differently: Take 20 mg by mouth daily at 6 PM. 06/22/23  Yes Waddell Danelle ORN, MD  vitamin B-12 (CYANOCOBALAMIN ) 50 MCG tablet Take 50 mcg by mouth daily.   Yes [provider]  VITAMIN D  PO Take 1 tablet by mouth daily.   Yes [provider]    Scheduled Meds:  ALPRAZolam   0.5 mg Oral BID   And   ALPRAZolam   1 mg Oral QHS   amLODipine   10 mg Oral Daily   dronedarone   400 mg Oral BID WC   fluticasone  furoate-vilanterol  1 puff Inhalation Daily   isosorbide  dinitrate  5 mg Oral TID   rivaroxaban   20 mg Oral Daily   simvastatin   20 mg Oral QHS   sodium chloride  flush  3 mL Intravenous Q12H   Continuous Infusions:  PRN Meds: acetaminophen , albuterol , melatonin, ondansetron  (ZOFRAN ) IV, polyethylene glycol  Allergies:    Allergies  Allergen Reactions   Alpha-Gal Anaphylaxis   Dexilant  [Dexlansoprazole ] Anaphylaxis   Mushroom Ext Cmplx(Shiitake-Reishi-Mait) Anaphylaxis    Rapid heart rate.   Penicillins Anaphylaxis    Immediate rash, facial/tongue/throat swelling, SOB or lightheadedness with hypotension   Doxycycline Nausea And Vomiting         Social History:   Social History   Socioeconomic History   Marital status: Married    Spouse name: Not on file   Number of children: Not on file   Years of education: Not on file   Highest education level: Not on file  Occupational History   Occupation: full time    Employer: UNEMPLOYED  Tobacco Use   Smoking status: Every Day    Current packs/day: 0.50    Average packs/day: 1 pack/day for 41.7 years (40.1 ttl pk-yrs)    Types: Cigarettes    Start date:  07/17/1982   Smokeless tobacco: Never   Tobacco comments:    1/2 pack a day 03/28/2024 KRD  Vaping Use   Vaping status: Never Used  Substance and Sexual  Activity   Alcohol use: Not Currently   Drug use: No   Sexual activity: Yes    Birth control/protection: None  Other Topics Concern   Not on file  Social History Narrative   Pt lives in [place] with wife.  5 children.  Unemployed due to panic attacks and back pain   Family History:    Family History  Problem Relation Age of Onset   Lung cancer Mother    Alcohol abuse Mother    Heart attack Father 2   Diabetes Father    Alcohol abuse Father    Asthma Sister    Anxiety disorder Sister    Depression Sister    Anxiety disorder Sister    Hypertension Brother    Hypertension Brother    Heart attack Brother 64   Diabetes Brother    Hypertension Brother    Seizures Brother    Dementia Paternal Uncle    ADD / ADHD Daughter    Dementia Cousin    Colon cancer Neg Hx    Drug abuse Neg Hx    Bipolar disorder Neg Hx    OCD Neg Hx    Paranoid behavior Neg Hx    Schizophrenia Neg Hx    Sexual abuse Neg Hx    Physical abuse Neg Hx      ROS:  Please see the history of present illness.  All other ROS reviewed and negative.     Physical Exam/Data: Vitals:   04/15/24 0400 04/15/24 0500 04/15/24 0600 04/15/24 0615  BP: 105/63 122/65 120/65   Pulse: (!) 43 (!) 46 (!) 47 (!) 50  Resp: 12 14 (!) 21 13  Temp:      TempSrc:      SpO2: 94% 95% 97% 97%  Weight:      Height:       No intake or output data in the 24 hours ending 04/15/24 0713    04/14/2024    6:35 PM 04/02/2024   12:44 PM 03/28/2024    9:31 AM  Last 3 Weights  Weight (lbs) 169 lb 1.5 oz 169 lb 169 lb 6.4 oz  Weight (kg) 76.7 kg 76.658 kg 76.839 kg     Body mass index is 22.93 kg/m.  General:  Well nourished, well developed, in no acute distres HEENT: normal Neck: no JVD Vascular: No carotid bruits; Distal pulses 2+ bilaterally Cardiac:  normal S1,  S2; RRR; no murmur  Lungs:  clear to auscultation bilaterally, no wheezing, rhonchi or rales  Abd: soft, nontender, no hepatomegaly  Ext: no edema Musculoskeletal:  No deformities, BUE and BLE strength normal and equal Skin: warm and dry  Neuro:  CNs 2-12 intact, no focal abnormalities noted Psych:  Normal affect   EKG:  The EKG was personally reviewed and demonstrates:  NSR, HR 61 (no ischemic changes since previous EKG) Telemetry:  Telemetry was personally reviewed and demonstrates: Sinus bradycardia with HR 40-60's with 2 brief episodes of controlled A-fib noted on 9/29 22:33-22:45 and 9/30 1:36-1:45  Relevant CV Studies: Cath 07/2015 1. Dist Cx lesion, 65% stenosed. 2. Mid LAD to Dist LAD lesion, 20% stenosed. 3. Prox RCA to Dist RCA lesion, 20% stenosed.   ? Diffuse nonobstructive atherosclerosis of all 3 coronary arteries. ? After intracoronary nitroglycerin , a 50-70% distal circumflex stenosis was identified. It does not appear to be hemodynamically significant. ? Normal left ventricular systolic function with ejection fraction of 60%. EDP is up at normal. RECOMMENDATIONS: ? Aggressive risk factor modification ?  Smoking cessation   ECHO 07/2021 IMPRESSIONS   1. Left ventricular ejection fraction, by estimation, is 60 to 65%. The  left ventricle has normal function. The left ventricle has no regional  wall motion abnormalities. Left ventricular diastolic parameters were  normal.   2. Right ventricular systolic function is normal. The right ventricular  size is normal. Tricuspid regurgitation signal is inadequate for assessing  PA pressure.   3. The mitral valve is normal in structure. No evidence of mitral valve  regurgitation. No evidence of mitral stenosis.   4. The aortic valve was not well visualized. Aortic valve regurgitation  is not visualized. No aortic stenosis is present.   5. The inferior vena cava is dilated in size with >50% respiratory  variability, suggesting  right atrial pressure of 8 mmHg.    Lexiscan  12/2022 Narrative & Impression      Stress ECG is negative for ischemia and arrhythmias. Patient experienced chest pain during the stress test.   LV perfusion is abnormal. There is no evidence of ischemia. There is no evidence of infarction. There is a medium sized perfusion defect with moderate reduction in uptake present in the apical to basal inferior wall (more intensified on rest images) consistent with soft tissue attenuation.   Left ventricular function is normal. Nuclear stress EF: 61 %.   Findings are consistent with no ischemia. The study is low risk.    Zio 12/2022 NSR with sinus brady (45/min) and sinus tachy (126/min) with ave HR 56/min. Less than 1% atrial fib/flutter with a slow VR (54/min), CVR and RVR (126/min) Rare PVC's and PAC's (less than 1%) No prolonged pauses No VT or SVT [name],MD Patch Wear Time:  12 days and 21 hours (2024-06-11T11:20:27-0400 to 2024-06-24T08:21:03-399)    Cardiac CTA 04/2023 IMPRESSION: 1. Three vessel calcium  noted score 361 which is 90 th percentile for age/sex 2.  Normal ascending thoracic aorta 3.0 cm 3. CAD RADS 3 possible obstructive CAD in LAD/RCA study sent for FFR IMPRESSION: No acute findings in the imaged extracardiac chest. FINDINGS: FFR CT positive in most distal RCA RCA proximal 0.98, mid 0.81 distal 0.69 FFR CT positive in most distal LAD LAD 0.96 proximal, 0.76 mid and 0.65 distal FFR CT negative in LCX LCX 0.98 proximal, 0.89 mid and 0.79 distal   IMPRESSION: FFR CT positive in most distal RCA and LAD  Laboratory Data: High Sensitivity Troponin:   Recent Labs  Lab 04/02/24 1417 04/02/24 1653 04/14/24 1917 04/14/24 2110 04/15/24 0518  TROPONINIHS <2 2 <2 <2 <2     Chemistry Recent Labs  Lab 04/14/24 1917 04/15/24 0518  NA 135 139  K 3.5 3.7  CL 102 104  CO2 21* 27  GLUCOSE 150* 95  BUN 18 18  CREATININE 0.78 0.84  CALCIUM  8.4* 8.5*  MG 2.0 2.1   GFRNONAA >60 >60  ANIONGAP 12 8    No results for input(s): PROT, ALBUMIN, AST, ALT, ALKPHOS, BILITOT in the last 168 hours. Lipids No results for input(s): CHOL, TRIG, HDL, LABVLDL, LDLCALC, CHOLHDL in the last 168 hours.  Hematology Recent Labs  Lab 04/14/24 1917 04/15/24 0518  WBC 6.6 6.8  RBC 4.46 4.21*  HGB 14.4 13.6  HCT 41.0 39.1  MCV 91.9 92.9  MCH 32.3 32.3  MCHC 35.1 34.8  RDW 12.1 12.3  PLT 166 172   Thyroid   Recent Labs  Lab 04/14/24 2110  TSH 0.747    BNPNo results for input(s): BNP, PROBNP in the last 168 hours.  DDimer No results for input(s): DDIMER in the last 168 hours.  Radiology/Studies:  DG Chest Portable 1 View Result Date: 04/14/2024 EXAM: 1 VIEW(S) XRAY OF THE CHEST 04/14/2024 07:00:00 PM COMPARISON: 04/02/2024 CLINICAL HISTORY: Per triage: Pt c/o chest pain and palpitations. Ems states pt is going in and out of afib. Pt stated pain travels to left side of neck and has a burning like sensation in his left hand/ arm. FINDINGS: LUNGS AND PLEURA: No focal pulmonary opacity. No pulmonary edema. No pleural effusion. No pneumothorax. HEART AND MEDIASTINUM: No acute abnormality of the cardiac and mediastinal silhouettes. BONES AND SOFT TISSUES: No acute osseous abnormality. IMPRESSION: 1. No acute abnormalities. Electronically signed by: Norman Gatlin MD 04/14/2024 07:12 PM EDT RP Workstation: [id number]     Assessment and Plan: CAD  HLD  Moderate CAD by cardiac catheterization in 2017 and recent Coronary CTA 04/2023 showed scattered stenoses as discussed above but FFR was only positive in the distal RCA and distal LAD and medical management was recommended at that time. ECHO 07/2021: EF 60 to 65% with normal structure and function. EKG: NSR, HR 61 (no ischemic changes since previous EKG) Reports atypical chest pain as above with reproducible chest pain on the left side of chest.  Suspect most likely MSK. However also reports  increased frequency in exertional chest pain over the past month.  Reviewed previous ischemic testing.  Discussed how Imdur  could be effective for distal stenosis. Patient is willing to try.  Can consider repeat ECHO. Would avoid repeat stress test at this time in the setting of negative troponins, nonischemic EKG, atypical presentation, and recent ischemic testing.  Can reconsider if no improvement with Imdur .  If stress test is indicated in the future then patient would prefer treadmill stress test.  Patient understands if target HR not achieved then we switch to Lexiscan . Scheduled to resume home simvastatin  20 mg. Scheduled to start isosorbide  dinitrate 5 mg 3 times daily. Not on ASA given the need for anticoagulation.   HTN  Most recent BP 120/65 Scheduled to resume home amlodipine  10 mg daily  Paroxysmal A-fib PAC's Zio 12/2022: NSR with SB and ST, Avg HR 56, less than 1<% afib/flutter with slow VR, rare PAC/PVC Reports episodes of palpitations described as a racing heart. Tele: Sinus bradycardia with HR 40-60's with PAC's 2 brief episodes of controlled A-fib noted on 9/29 22:33-22:45 and 9/30 1:36-1:45 In setting of bradycardia, previously switched from Toprol  XL 6.25 mg daily to 12.5 mg as needed for palpitations.  Scheduled to resume resume Xarelto  20 mg daily and Multaq  400 mg BID.  With recurrent afib on multaq  w/o missed doses, would consider medication failure. Need to follow up with Afib clinic.  Consider repeat Zio at discharge    Risk Assessment/Risk Scores:  CHA2DS2-VASc Score = 2   This indicates a 2.2% annual risk of stroke. The patient's score is based upon: CHF History: 0 HTN History: 1 Diabetes History: 0 Stroke History: 0 Vascular Disease History: 1 Age Score: 0 Gender Score: 0     For questions or updates, please contact [facility] Please consult [email or web address] for contact info under      Signed, Lorette CINDERELLA Kapur, PA-C  04/15/2024 7:13 AM

## 2024-04-15 NOTE — Discharge Summary (Signed)
 Physician Discharge Summary  Richard [name] FMW:[id number] DOB: [birth year] DOA: 04/14/2024  PCP: Edman Meade PEDLAR, FNP  Admit date: 04/14/2024  Discharge date: 04/15/2024  Admitted From:Home  Disposition:  Home  Recommendations for Outpatient Follow-up:  Follow up with cardiology as scheduled on 10/14 at 8 AM Metoprolol  12.5 mg twice daily prescribed once heart rate is greater than 55 bpm, discontinue Multaq  per cardiology recommendations 2-week monitor to be given at discharge Continue other home medications as prior  Home Health: None  Equipment/Devices: None  Discharge Condition:Stable  CODE STATUS: Full  Diet recommendation: Heart Healthy/carb modified  Brief/Interim Summary: Richard [name] is a 58 y.o. male with hx of CAD with distal LAD/RCA disease by coronary CTA, stable angina on medical therapy, carotid artery disease, paroxysmal Afib, on AC, asthma, HTN, HLD, DM2, GERD, who presented with chest pain.  He was noted to have symptomatology with severe fatigue and palpitations likely related to some intermittent atrial fibrillation as well as noted bradycardia.  He was seen by cardiology with recommendations to stop his Multaq  and start metoprolol  12.5 mg twice daily once heart rates are greater than 55 bpm.  He will have cardiac monitor mailed to his home.  He is cleared for discharge from cardiology standpoint and no other acute events or concerns have been noted.  Discharge Diagnoses:  Principal Problem:   Chest pain  Principal discharge diagnosis: Symptomatic bradycardia with intermittent tachycardia related to atrial fibrillation.  Discharge Instructions  Discharge Instructions     Diet - low sodium heart healthy   Complete by: As directed    Increase activity slowly   Complete by: As directed       Allergies as of 04/15/2024       Reactions   Alpha-gal Anaphylaxis   Dexilant  [dexlansoprazole ] Anaphylaxis   Mushroom Ext Cmplx(shiitake-reishi-mait)  Anaphylaxis   Rapid heart rate.   Penicillins Anaphylaxis   Immediate rash, facial/tongue/throat swelling, SOB or lightheadedness with hypotension   Doxycycline Nausea And Vomiting           Medication List     STOP taking these medications    metoprolol  succinate 25 MG 24 hr tablet Commonly known as: Toprol  XL   Multaq  400 MG tablet Generic drug: dronedarone        TAKE these medications    albuterol  108 (90 Base) MCG/ACT inhaler Commonly known as: VENTOLIN  HFA INHALE 2 PUFFS BY MOUTH EVERY 6 HOURS AS NEEDED FOR COUGHING, WHEEZING, OR SHORTNESS OF BREATH What changed: See the new instructions.   ALPRAZolam  0.5 MG tablet Commonly known as: Xanax  Take one twice a day and two at bedtime What changed:  how much to take how to take this when to take this additional instructions   amLODipine  10 MG tablet Commonly known as: NORVASC  Take 1 tablet (10 mg total) by mouth daily.   budesonide -formoterol  160-4.5 MCG/ACT inhaler Commonly known as: Symbicort  Take 2 puffs first thing in am and then another 2 puffs about 12 hours later.   EPINEPHrine  0.3 mg/0.3 mL Soaj injection Commonly known as: EPI-PEN Inject 0.3 mg into the muscle as needed for anaphylaxis.   esomeprazole  40 MG capsule Commonly known as: NexIUM  Take 1 capsule (40 mg total) by mouth 2 (two) times daily before a meal.   isosorbide  mononitrate 30 MG 24 hr tablet Commonly known as: IMDUR  Take 1 tablet (30 mg total) by mouth daily. What changed:  when to take this reasons to take this   Klor-Con  M20 20 MEQ tablet  Generic drug: potassium chloride  SA TAKE 1 TABLET BY MOUTH EVERY DAY   lidocaine  2 % solution Commonly known as: XYLOCAINE  Use as directed 15 mLs in the mouth or throat every 3 (three) hours as needed for mouth pain.   metoprolol  tartrate 25 MG tablet Commonly known as: LOPRESSOR  Take 0.5 tablets (12.5 mg total) by mouth 2 (two) times daily. To take twice daily once HR>55bpm.    nitroGLYCERIN  0.4 MG SL tablet Commonly known as: NITROSTAT  Place 1 tablet (0.4 mg total) under the tongue every 5 (five) minutes x 3 doses as needed for chest pain (if no relief after 3rd dose, proceed to the ED for an evaluation).   simvastatin  20 MG tablet Commonly known as: ZOCOR  Take 1 tablet by mouth once daily What changed: when to take this   vitamin B-12 50 MCG tablet Commonly known as: CYANOCOBALAMIN  Take 50 mcg by mouth daily.   VITAMIN D  PO Take 1 tablet by mouth daily.   Xarelto  20 MG Tabs tablet Generic drug: rivaroxaban  TAKE 1 TABLET BY MOUTH ONCE DAILY WITH SUPPER What changed: when to take this        Follow-up Information     Bacchus, Meade PEDLAR, FNP. Schedule an appointment as soon as possible for a visit in 1 week(s).   Specialty: Family Medicine Contact information: [address] [place] [address] [phone]         Dunn, Dayna N, PA-C. Go on 04/29/2024.   Specialties: Cardiology, Radiology Contact information: [address] [place] [address] [phone]                Allergies  Allergen Reactions   Alpha-Gal Anaphylaxis   Dexilant  [Dexlansoprazole ] Anaphylaxis   Mushroom Ext Cmplx(Shiitake-Reishi-Mait) Anaphylaxis    Rapid heart rate.   Penicillins Anaphylaxis    Immediate rash, facial/tongue/throat swelling, SOB or lightheadedness with hypotension   Doxycycline Nausea And Vomiting         Consultations: Cardiology   Procedures/Studies: DG Chest Portable 1 View Result Date: 04/14/2024 EXAM: 1 VIEW(S) XRAY OF THE CHEST 04/14/2024 07:00:00 PM COMPARISON: 04/02/2024 CLINICAL HISTORY: Per triage: Pt c/o chest pain and palpitations. Ems states pt is going in and out of afib. Pt stated pain travels to left side of neck and has a burning like sensation in his left hand/ arm. FINDINGS: LUNGS AND PLEURA: No focal pulmonary opacity. No pulmonary edema. No pleural effusion. No pneumothorax. HEART AND MEDIASTINUM:  No acute abnormality of the cardiac and mediastinal silhouettes. BONES AND SOFT TISSUES: No acute osseous abnormality. IMPRESSION: 1. No acute abnormalities. Electronically signed by: Norman Gatlin MD 04/14/2024 07:12 PM EDT RP Workstation: [id number]   DG Chest 2 View Result Date: 04/02/2024 CLINICAL DATA:  Tachycardia and dizziness with neck pain. EXAM: CHEST - 2 VIEW COMPARISON:  March 06, 2024 FINDINGS: The heart size and mediastinal contours are within normal limits. Both lungs are clear. The visualized skeletal structures are unremarkable. IMPRESSION: No active cardiopulmonary disease. Electronically Signed   By: Suzen Dials M.D.   On: 04/02/2024 13:23     Discharge Exam: Vitals:   04/15/24 0755 04/15/24 0843  BP:  125/67  Pulse:  (!) 46  Resp:  16  Temp: 97.7 F (36.5 C) 97.7 F (36.5 C)  SpO2:  97%   Vitals:   04/15/24 0745 04/15/24 0750 04/15/24 0755 04/15/24 0843  BP:    125/67  Pulse: (!) 50 (!) 44  (!) 46  Resp: 13 (!) 9  16  Temp:   97.7 F (36.5 C) 97.7 F (36.5 C)  TempSrc:   Oral Oral  SpO2: 98% 97%  97%  Weight:    75.1 kg  Height:    6' (1.829 m)    General: Pt is alert, awake, not in acute distress Cardiovascular: RRR, S1/S2 +, no rubs, no gallops, bradycardic Respiratory: CTA bilaterally, no wheezing, no rhonchi Abdominal: Soft, NT, ND, bowel sounds + Extremities: no edema, no cyanosis    The results of significant diagnostics from this hospitalization (including imaging, microbiology, ancillary and laboratory) are listed below for reference.     Microbiology: No results found for this or any previous visit (from the past 240 hours).   Labs: BNP (last 3 results) No results for input(s): BNP in the last 8760 hours. Basic Metabolic Panel: Recent Labs  Lab 04/14/24 1917 04/15/24 0518  NA 135 139  K 3.5 3.7  CL 102 104  CO2 21* 27  GLUCOSE 150* 95  BUN 18 18  CREATININE 0.78 0.84  CALCIUM  8.4* 8.5*  MG 2.0 2.1  PHOS  --  4.9*    Liver Function Tests: No results for input(s): AST, ALT, ALKPHOS, BILITOT, PROT, ALBUMIN in the last 168 hours. No results for input(s): LIPASE, AMYLASE in the last 168 hours. No results for input(s): AMMONIA in the last 168 hours. CBC: Recent Labs  Lab 04/14/24 1917 04/15/24 0518  WBC 6.6 6.8  NEUTROABS 4.9  --   HGB 14.4 13.6  HCT 41.0 39.1  MCV 91.9 92.9  PLT 166 172   Cardiac Enzymes: No results for input(s): CKTOTAL, CKMB, CKMBINDEX, TROPONINI in the last 168 hours. BNP: Invalid input(s): POCBNP CBG: Recent Labs  Lab 04/14/24 1848  GLUCAP 181*   D-Dimer No results for input(s): DDIMER in the last 72 hours. Hgb A1c No results for input(s): HGBA1C in the last 72 hours. Lipid Profile No results for input(s): CHOL, HDL, LDLCALC, TRIG, CHOLHDL, LDLDIRECT in the last 72 hours. Thyroid  function studies Recent Labs    04/14/24 2110  TSH 0.747   Anemia work up No results for input(s): VITAMINB12, FOLATE, FERRITIN, TIBC, IRON, RETICCTPCT in the last 72 hours. Urinalysis    Component Value Date/Time   COLORURINE YELLOW 08/03/2021 0127   APPEARANCEUR Clear 10/04/2023 1404   LABSPEC 1.015 08/03/2021 0127   PHURINE 5.5 08/03/2021 0127   GLUCOSEU Negative 10/04/2023 1404   HGBUR NEGATIVE 08/03/2021 0127   BILIRUBINUR Negative 10/04/2023 1404   KETONESUR NEGATIVE 08/03/2021 0127   PROTEINUR Trace 10/04/2023 1404   PROTEINUR NEGATIVE 08/03/2021 0127   UROBILINOGEN 0.2 10/14/2012 1800   NITRITE Negative 10/04/2023 1404   NITRITE NEGATIVE 08/03/2021 0127   LEUKOCYTESUR Negative 10/04/2023 1404   LEUKOCYTESUR NEGATIVE 08/03/2021 0127   Sepsis Labs Recent Labs  Lab 04/14/24 1917 04/15/24 0518  WBC 6.6 6.8   Microbiology No results found for this or any previous visit (from the past 240 hours).   Time coordinating discharge: 35 minutes  SIGNED:   Adron JONETTA Fairly, DO [facility] 04/15/2024,  12:06 PM  If 7PM-7AM, please contact night-coverage [email or web address]

## 2024-04-15 NOTE — Plan of Care (Signed)
 Problem: Education: Goal: Knowledge of General Education information will improve Description: Including pain rating scale, medication(s)/side effects and non-pharmacologic comfort measures 04/15/2024 1243 by Kermit Erby NOVAK, RN Outcome: Adequate for Discharge 04/15/2024 0855 by Kermit Erby NOVAK, RN Outcome: Progressing   Problem: Health Behavior/Discharge Planning: Goal: Ability to manage health-related needs will improve 04/15/2024 1243 by Kermit Erby NOVAK, RN Outcome: Adequate for Discharge 04/15/2024 0855 by Kermit Erby NOVAK, RN Outcome: Progressing   Problem: Clinical Measurements: Goal: Ability to maintain clinical measurements within normal limits will improve 04/15/2024 1243 by Kermit Erby NOVAK, RN Outcome: Adequate for Discharge 04/15/2024 0855 by Kermit Erby NOVAK, RN Outcome: Progressing Goal: Will remain free from infection 04/15/2024 1243 by Kermit Erby NOVAK, RN Outcome: Adequate for Discharge 04/15/2024 0855 by Kermit Erby NOVAK, RN Outcome: Progressing Goal: Diagnostic test results will improve 04/15/2024 1243 by Kermit Erby NOVAK, RN Outcome: Adequate for Discharge 04/15/2024 0855 by Kermit Erby NOVAK, RN Outcome: Progressing Goal: Respiratory complications will improve 04/15/2024 1243 by Kermit Erby NOVAK, RN Outcome: Adequate for Discharge 04/15/2024 0855 by Kermit Erby NOVAK, RN Outcome: Progressing Goal: Cardiovascular complication will be avoided 04/15/2024 1243 by Kermit Erby NOVAK, RN Outcome: Adequate for Discharge 04/15/2024 0855 by Kermit Erby NOVAK, RN Outcome: Progressing   Problem: Clinical Measurements: Goal: Ability to maintain clinical measurements within normal limits will improve 04/15/2024 1243 by Kermit Erby NOVAK, RN Outcome: Adequate for Discharge 04/15/2024 0855 by Kermit Erby NOVAK, RN Outcome: Progressing Goal: Will remain free from infection 04/15/2024 1243 by Kermit Erby NOVAK, RN Outcome: Adequate  for Discharge 04/15/2024 0855 by Kermit Erby NOVAK, RN Outcome: Progressing Goal: Diagnostic test results will improve 04/15/2024 1243 by Kermit Erby NOVAK, RN Outcome: Adequate for Discharge 04/15/2024 0855 by Kermit Erby NOVAK, RN Outcome: Progressing Goal: Respiratory complications will improve 04/15/2024 1243 by Kermit Erby NOVAK, RN Outcome: Adequate for Discharge 04/15/2024 0855 by Kermit Erby NOVAK, RN Outcome: Progressing Goal: Cardiovascular complication will be avoided 04/15/2024 1243 by Kermit Erby NOVAK, RN Outcome: Adequate for Discharge 04/15/2024 0855 by Kermit Erby NOVAK, RN Outcome: Progressing   Problem: Activity: Goal: Risk for activity intolerance will decrease 04/15/2024 1243 by Kermit Erby NOVAK, RN Outcome: Adequate for Discharge 04/15/2024 0855 by Kermit Erby NOVAK, RN Outcome: Progressing   Problem: Nutrition: Goal: Adequate nutrition will be maintained 04/15/2024 1243 by Kermit Erby NOVAK, RN Outcome: Adequate for Discharge 04/15/2024 0855 by Kermit Erby NOVAK, RN Outcome: Progressing   Problem: Coping: Goal: Level of anxiety will decrease 04/15/2024 1243 by Kermit Erby NOVAK, RN Outcome: Adequate for Discharge 04/15/2024 0855 by Kermit Erby NOVAK, RN Outcome: Progressing   Problem: Elimination: Goal: Will not experience complications related to bowel motility 04/15/2024 1243 by Kermit Erby NOVAK, RN Outcome: Adequate for Discharge 04/15/2024 0855 by Kermit Erby NOVAK, RN Outcome: Progressing Goal: Will not experience complications related to urinary retention 04/15/2024 1243 by Kermit Erby NOVAK, RN Outcome: Adequate for Discharge 04/15/2024 0855 by Kermit Erby NOVAK, RN Outcome: Progressing   Problem: Pain Managment: Goal: General experience of comfort will improve and/or be controlled 04/15/2024 1243 by Kermit Erby NOVAK, RN Outcome: Adequate for Discharge 04/15/2024 0855 by Kermit Erby NOVAK, RN Outcome:  Progressing   Problem: Safety: Goal: Ability to remain free from injury will improve 04/15/2024 1243 by Kermit Erby NOVAK, RN Outcome: Adequate for Discharge 04/15/2024 0855 by Kermit Erby NOVAK, RN Outcome: Progressing   Problem: Skin Integrity: Goal: Risk for impaired skin integrity will decrease 04/15/2024 1243 by Kermit Erby NOVAK, RN Outcome: Adequate for Discharge  04/15/2024 0855 by Kermit Erby NOVAK, RN Outcome: Progressing

## 2024-04-15 NOTE — Telephone Encounter (Signed)
 Per Dr. Mallipeddi  Saw him. started diet. No need of any stress test or cath. His symptoms are mainly from bradycardia I believe. I discontinued Multaq . Started metoprolol  tartarate 12.5 mg BID (only to take after his HR>55 bpm). He is having headaches after placing nitropatch. Please remove nitro patch. D/ced ISDN. He reports having palpitations at home, unclear if its Afib versus anxiety induced palpitations. He needs 2 week live monitor, can be placed today or mailed to his house. He already has appt with cards in Oct 2025. keep it. Okay to go home today.   Zio monitor order placed.

## 2024-04-15 NOTE — Plan of Care (Signed)

## 2024-04-15 NOTE — Progress Notes (Signed)
   04/15/24 1058  TOC Brief Assessment  Insurance and Status Reviewed  Patient has primary care physician Yes  Home environment has been reviewed Home with spouse  Prior level of function: Independent  Prior/Current Home Services No current home services  Social Drivers of Health Review SDOH reviewed no interventions necessary  Readmission risk has been reviewed Yes  Transition of care needs no transition of care needs at this time   Work up continues, possible stress test today. DC TMRW  Transition of Care Department (TOC) has reviewed patient and no TOC needs have been identified at this time. We will continue to monitor patient advancement through interdisciplinary progression rounds. If new patient transition needs arise, please place a TOC consult.

## 2024-04-15 NOTE — H&P (Signed)
 History and Physical    Richard [name] FMW:[id number] DOB: [birth year] DOA: 04/14/2024  PCP: Edman Meade PEDLAR, FNP   Patient coming from: Home   Chief Complaint:  Chief Complaint  Patient presents with   Chest Pain    HPI:  Richard [name] is a 58 y.o. male with hx of CAD with distal LAD/RCA disease by coronary CTA, stable angina on medical therapy, carotid artery disease, paroxysmal Afib, on AC, asthma, HTN, HLD, DM2, GERD, who presented with chest pain. Reports that he developed acute chest pain after waking up yesterday around 7 AM. Describes pain radiating up to neck, left shoulder. Was worse while active, and reports was associated with sense of palpitations and thinks he was in Afib. Pain continued all day without relenting, but worsened during periods of activity. Took 4 baby aspirin  at home PTA and 1/2 tab of his Metoprolol . Reports that when loading with EMS they shook him and he felt that he converted out of afib., He is still smoking ~ 1/4 - 1/2 PPD, and says that if he doesn't have a drag of a cigarette before bed that he will stop breathing during his sleep, wife thinks that he has sleep apnea.   Otherwise reports does have intermittent neck pain, and he has had a dysesthesia over the back of his left hand over past 4 days. Did lift his shoulder and felt a pop about a week ago although had improved since then.    Review of Systems:  ROS complete and negative except as marked above   Allergies  Allergen Reactions   Alpha-Gal Anaphylaxis   Dexilant  [Dexlansoprazole ] Anaphylaxis   Mushroom Ext Cmplx(Shiitake-Reishi-Mait) Anaphylaxis    Rapid heart rate.   Penicillins Anaphylaxis    Immediate rash, facial/tongue/throat swelling, SOB or lightheadedness with hypotension   Doxycycline Nausea And Vomiting         Prior to Admission medications   Medication Sig Start Date End Date Taking? Authorizing Provider  albuterol  (VENTOLIN  HFA) 108 (90 Base) MCG/ACT inhaler INHALE 2  PUFFS BY MOUTH EVERY 6 HOURS AS NEEDED FOR COUGHING, WHEEZING, OR SHORTNESS OF BREATH Patient taking differently: Inhale 2 puffs into the lungs every 6 (six) hours as needed for wheezing or shortness of breath (coughing). INHALE 2 PUFFS BY MOUTH EVERY 6 HOURS AS NEEDED FOR COUGHING, WHEEZING, OR SHORTNESS OF BREATH 06/19/22  Yes Comer Kirsch, PA-C  ALPRAZolam  (XANAX ) 0.5 MG tablet Take one twice a day and two at bedtime Patient taking differently: Take 0.5-1 mg by mouth 3 (three) times daily. Take 1 tablet by mouth in the morning and in the evening, then take 2 tablets at bedtime. 02/11/24  Yes Okey Barnie SAUNDERS, MD  amLODipine  (NORVASC ) 10 MG tablet Take 1 tablet (10 mg total) by mouth daily. 02/03/24  Yes Zhao, Xika, NP  budesonide -formoterol  (SYMBICORT ) 160-4.5 MCG/ACT inhaler Take 2 puffs first thing in am and then another 2 puffs about 12 hours later. 01/14/24  Yes Darlean Ozell NOVAK, MD  dronedarone  (MULTAQ ) 400 MG tablet Take 1 tablet (400 mg total) by mouth 2 (two) times daily with a meal. 05/18/23 05/18/24 Yes Waddell Danelle ORN, MD  EPINEPHrine  0.3 mg/0.3 mL IJ SOAJ injection Inject 0.3 mg into the muscle as needed for anaphylaxis. 09/24/23  Yes Tobie Arleta SQUIBB, MD  esomeprazole  (NEXIUM ) 40 MG capsule Take 1 capsule (40 mg total) by mouth 2 (two) times daily before a meal. 04/08/24  Yes Shirlean Therisa ORN, NP  isosorbide  mononitrate (IMDUR ) 30 MG 24  hr tablet Take 1 tablet (30 mg total) by mouth daily. Patient taking differently: Take 30 mg by mouth daily as needed (chest discomfort). 05/10/23  Yes Strader, Laymon HERO, PA-C  KLOR-CON  M20 20 MEQ tablet TAKE 1 TABLET BY MOUTH EVERY DAY 07/19/23  Yes Dunn, Dayna N, PA-C  lidocaine  (XYLOCAINE ) 2 % solution Use as directed 15 mLs in the mouth or throat every 3 (three) hours as needed for mouth pain. 01/15/24  Yes Chandra Harlene LABOR, NP  metoprolol  succinate (TOPROL  XL) 25 MG 24 hr tablet Take 0.5 tablets (12.5 mg total) by mouth as needed. Patient taking differently:  Take 12.5 mg by mouth as needed (high heartrate). 03/25/24  Yes Debera Jayson MATSU, MD  nitroGLYCERIN  (NITROSTAT ) 0.4 MG SL tablet Place 1 tablet (0.4 mg total) under the tongue every 5 (five) minutes x 3 doses as needed for chest pain (if no relief after 3rd dose, proceed to the ED for an evaluation). 07/26/22  Yes Waddell Danelle ORN, MD  rivaroxaban  (XARELTO ) 20 MG TABS tablet TAKE 1 TABLET BY MOUTH ONCE DAILY WITH SUPPER Patient taking differently: Take 20 mg by mouth daily. 10/08/23  Yes Debera Jayson MATSU, MD  simvastatin  (ZOCOR ) 20 MG tablet Take 1 tablet by mouth once daily Patient taking differently: Take 20 mg by mouth daily at 6 PM. 06/22/23  Yes Waddell Danelle ORN, MD  vitamin B-12 (CYANOCOBALAMIN ) 50 MCG tablet Take 50 mcg by mouth daily.   Yes [provider]  VITAMIN D  PO Take 1 tablet by mouth daily.   Yes [provider]    Past Medical History:  Diagnosis Date   Arthritis    Asthma    CAD (coronary artery disease)    a. Cardiac cath 07/2015 showed 65% distal Cx, 20% mid-distal LAD, 20% prox-distal RCA, EF 60%, EDP .   Colitis 1990   COPD (chronic obstructive pulmonary disease) (HCC)    Depression    Dysrhythmia    Essential hypertension    Fatty liver    Gastric ulcer 2003; 2012   2003: + esophagitis; negative H.pylori serology  2012: Dr. Harvey, mild gastritis, Bravo PH probe placement, negative H.pylori   GERD (gastroesophageal reflux disease)    Hepatic steatosis    History of hiatal hernia    Hyperlipemia    Overweight    Panic attacks    Paroxysmal atrial fibrillation (HCC)    Pulmonary nodules    Stroke (HCC)    TIA (transient ischemic attack)    Type II diabetes mellitus (HCC)     Past Surgical History:  Procedure Laterality Date   BALLOON DILATION N/A 07/25/2021   Procedure: BALLOON DILATION;  Surgeon: Cindie Carlin POUR, DO;  [facility];  Service: Endoscopy;  Laterality: N/A;   BIOPSY  07/25/2021   Procedure: BIOPSY;  Surgeon:  Cindie Carlin POUR, DO;  [facility];  Service: Endoscopy;;   BIOPSY  12/04/2022   Procedure: BIOPSY;  Surgeon: Cindie Carlin POUR, DO;  [facility];  Service: Endoscopy;;   BRAVO PH STUDY  05/03/2011   DOQ:Fpoi gastritis/normal esophagus and duodenum   CARDIAC CATHETERIZATION  1990s X 1; 2005; 08/12/2015   CARDIAC CATHETERIZATION N/A 08/12/2015   Procedure: Left Heart Cath and Coronary Angiography;  Surgeon: Victory ORN Sharps, MD; LAD 20%, CFX 65%, RCA 20%, EF 60%    COLONOSCOPY  1990   COLONOSCOPY WITH PROPOFOL  N/A 11/21/2016   Dr. Harvey: non-thrombosed external hemorrhoids, one 6 mm polyp (polypoid lesion), internal  hemorrhoids. TI Normal. 10 years screening   ESOPHAGOGASTRODUODENOSCOPY  05/03/2011   DOQ:fpoi gastritis   ESOPHAGOGASTRODUODENOSCOPY (EGD) WITH PROPOFOL  N/A 07/25/2021   Procedure: ESOPHAGOGASTRODUODENOSCOPY (EGD) WITH PROPOFOL ;  Surgeon: Cindie Carlin POUR, DO;  [facility];  Service: Endoscopy;  Laterality: N/A;  1:30pm   ESOPHAGOGASTRODUODENOSCOPY (EGD) WITH PROPOFOL  N/A 12/04/2022   Procedure: ESOPHAGOGASTRODUODENOSCOPY (EGD) WITH PROPOFOL ;  Surgeon: Cindie Carlin POUR, DO;  [facility];  Service: Endoscopy;  Laterality: N/A;  8:00AM;ASA 3   NECK MASS EXCISION Right    done in dr's office; behind right ear/side of ncek   POLYPECTOMY  11/21/2016   Procedure: POLYPECTOMY;  Surgeon: Harvey Margo CROME, MD;  [facility];  Service: Endoscopy;;  descending colon polyp   SHOULDER ARTHROSCOPY W/ ROTATOR CUFF REPAIR Right 2006   acromioclavicular joint arthrosis     reports that he has been smoking cigarettes. He started smoking about 41 years ago. He has a 40.1 pack-year smoking history. He has never used smokeless tobacco. He reports that he does not currently use alcohol. He reports that he does not use drugs.  Family History  Problem Relation Age of Onset   Lung cancer Mother    Alcohol abuse Mother    Heart attack Father 41    Diabetes Father    Alcohol abuse Father    Asthma Sister    Anxiety disorder Sister    Depression Sister    Anxiety disorder Sister    Hypertension Brother    Hypertension Brother    Heart attack Brother 67   Diabetes Brother    Hypertension Brother    Seizures Brother    Dementia Paternal Education officer, community    ADD / ADHD Daughter    Dementia Cousin    Colon cancer Neg Hx    Drug abuse Neg Hx    Bipolar disorder Neg Hx    OCD Neg Hx    Paranoid behavior Neg Hx    Schizophrenia Neg Hx    Sexual abuse Neg Hx    Physical abuse Neg Hx      Physical Exam: Vitals:   04/15/24 0000 04/15/24 0100 04/15/24 0200 04/15/24 0300  BP: 104/62 (!) 115/90 107/65 105/65  Pulse: (!) 50 (!) 53 (!) 48 (!) 45  Resp: 15 17 13 12   Temp:   98 F (36.7 C)   TempSrc:      SpO2: 95% 94% 93% 94%  Weight:      Height:        Gen: Awake, alert, NAD   CV: Regular, normal S1, S2, no murmurs  Resp: Normal WOB, CTAB  Abd: Flat, normoactive, nontender MSK: Symmetric, no edema  Skin: No rashes or lesions to exposed skin  Neuro: Alert and interactive  Psych: euthymic, appropriate    Data review:   Labs reviewed, notable for:   K 3.5  Bicarb 21, AG 12  HS trop < 2 x 2  CBC unremarkable     Micro:  Results for orders placed or performed in visit on 08/09/23  Microscopic Examination     Status: Abnormal   Collection Time: 08/09/23 11:41 AM   Urine  Result Value Ref Range Status   WBC, UA None seen 0 - 5 /hpf Final   RBC, Urine 0-2 0 - 2 /hpf Final   Epithelial Cells (non renal) 0-10 0 - 10 /hpf Final   Mucus, UA Present (A) Not Estab. Final   Bacteria, UA None seen None seen/Few Final   *  Note: Due to a large number of results and/or encounters for the requested time period, some results have not been displayed. A complete set of results can be found in Results Review.    Imaging reviewed:  DG Chest Portable 1 View Result Date: 04/14/2024 EXAM: 1 VIEW(S) XRAY OF THE CHEST 04/14/2024 07:00:00 PM  COMPARISON: 04/02/2024 CLINICAL HISTORY: Per triage: Pt c/o chest pain and palpitations. Ems states pt is going in and out of afib. Pt stated pain travels to left side of neck and has a burning like sensation in his left hand/ arm. FINDINGS: LUNGS AND PLEURA: No focal pulmonary opacity. No pulmonary edema. No pleural effusion. No pneumothorax. HEART AND MEDIASTINUM: No acute abnormality of the cardiac and mediastinal silhouettes. BONES AND SOFT TISSUES: No acute osseous abnormality. IMPRESSION: 1. No acute abnormalities. Electronically signed by: Norman Gatlin MD 04/14/2024 07:12 PM EDT RP Workstation: [id number]   Historical data: Coronary CTA 10/'24  TECHNIQUE: The best systolic and diastolic phases of the patients gated cardiac CTA sent to [facility] for hemodynamic analysis   FINDINGS: FFR CT positive in most distal RCA   RCA proximal 0.98, mid 0.81 distal 0.69   FFR CT positive in most distal LAD   LAD 0.96 proximal, 0.76 mid and 0.65 distal   FFR CT negative in LCX   LCX 0.98 proximal, 0.89 mid and 0.79 distal   IMPRESSION: FFR CT positive in most distal RCA and LAD   EKG:  Personally reviewed , SR with no acute ischemic changes    ED Course:  Patient took 324 mg aspirin  at home. And already is anticoagulated with Xarelto , took this AM.  EDP spoke with cardiology fellow, recommending for observation and possible stress testing.     Assessment/Plan:  58 y.o. male with hx CAD with distal LAD/RCA disease by coronary CTA, stable angina on medical therapy, carotid artery disease, paroxysmal Afib, on AC, asthma, HTN, HLD, DM2, GERD, who presented with chest pain    Chest pain  Distal CAD by Coronary CTA  Acute + prolonged chest pain, atypical due to prolonged nature. Reports exertional + thinks may have been in Afib while symptomatic. EKG nonischemic. HS trop undetectable x 2. See prior cardiac eval above, most recent coronary eval in 10/'24 with distal disease in LAD / RCA.  Possible anginal symptoms although seems less likely with prolonged nature and neg troponin, possible cervical / throacic disease considering his radicular type symptoms below. Possibly MSK with recent pain near the shoulder after hyperextending. GI felt less likely.  -- EDP spoke with cardiology fellow, recommending for observation and possible stress testing.   -- Routine cardiology consult in AM  -- S/p aspirin  324 mg at home, for now just continue [facility] with Xarelto   -- Trial ISDN 5 mg TID for antianginal, BP is borderline, monitor  - For other antianginals amlodipine  10 mg, metoprolol  has recently been on hold with bradycardia -- Check lipids and A1c  Neck pain, ? Cervical radiculopathy  Reports chronic and intermittent neck pain, with pain sometimes radiating into the arm. He has dysesthesia along the volar surface of his hand / radial n distribution although sensation and strength is intact.  -- Recommend for OP evaluation including MRI of the C spine, think ok to hold off for now IP   Smoking cessation  -- Nicotine  lozenge prn at discharge, or low dose patch 7 mg, does not want in hospital for now.  -- Counseled on smoking cessation esp with his concern about heart  disease.   Sleep disordered breathing  -- Recommend OP eval with sleep study   Chronic medical problems  Carotid artery disease: See CAD above  Paroxysmal Afib: Currently SR,  Metoprolol  has been on hold OP due to bradycardia, continue home Dronedarone , continue Xarelto   Asthma: Nebs prn HTN: controlled, on amlodipine , trialing ISDN per above ; has ISMN at home but has not been using  HLD: Continue home simvastatin  DM2: Diet controlled, check A1c, SSI for very sensitive GERD: Continue home PPI   Body mass index is 22.93 kg/m.    DVT prophylaxis:  Xarelto  Code Status:  Full Code Diet:  Diet Orders (From admission, onward)    None      Family Communication:  Yes discussed with wife at bedside   Consults:  None    Admission status: Obs, Telemetry bed  Severity of Illness: The appropriate patient status for this patient is OBSERVATION. Observation status is judged to be reasonable and necessary in order to provide the required intensity of service to ensure the patient's safety. The patient's presenting symptoms, physical exam findings, and initial radiographic and laboratory data in the context of their medical condition is felt to place them at decreased risk for further clinical deterioration. Furthermore, it is anticipated that the patient will be medically stable for discharge from the hospital within 2 midnights of admission.    Dorn Dawson, MD [facility]  How to contact the [facility] Attending or Consulting provider 7A - 7P or covering provider during after hours 7P -7A, for this patient.  Check the care team in [facility] and look for a) attending/consulting [facility] provider listed and b) the [facility] team listed Log into [email or web address] and use [facility]'s universal password to access. If you do not have the password, please contact the hospital operator. Locate the [facility] provider you are looking for under [facility] and page to a number that you can be directly reached. If you still have difficulty reaching the provider, please page the [facility] (Director on Call) for the Hospitalists listed on amion for assistance.  04/15/2024, 4:00 AM

## 2024-04-16 ENCOUNTER — Telehealth: Payer: Self-pay

## 2024-04-16 NOTE — Telephone Encounter (Signed)
 I try to send in electronically but it always defaults to saying the generic, but I have notated on it to be [facility] name only. Short of handwriting this, I am not sure I can change it. We can try a handwritten prescription.

## 2024-04-16 NOTE — Telephone Encounter (Signed)
 [facility] needs for the pt's Rx you sent in on 04/08/2024 to be written in the brand name in order for his insurance to approve it (PA has to be done) it will run out 04/25/2024. Please advise

## 2024-04-16 NOTE — Telephone Encounter (Signed)
 Yes PA should be fine.

## 2024-04-16 NOTE — Telephone Encounter (Signed)
 PA was denied. It stated a PA was not required. I also looked back to 04/26/2023 in media and it was approved only for Nexium  Caps DR 20mg . (60 Caps for 30 days). Please advise what is next

## 2024-04-16 NOTE — Telephone Encounter (Signed)
 Ok it should be complete in about 5 minutes. Scanned 3 pages in media regarding the PA for Nexium  40mg  DR Lenny

## 2024-04-16 NOTE — Telephone Encounter (Signed)
 Ok I will go ahead and do a PA under the brand name, as long as a script is there we can do it either way. Please advise

## 2024-04-16 NOTE — Telephone Encounter (Signed)
 Ok, can you upload what it states on the form? If PA is not required, he should be able to pick this up without any issue.

## 2024-04-16 NOTE — Transitions of Care (Post Inpatient/ED Visit) (Signed)
 04/16/2024  Name: Richard [name] MRN: [id number] DOB: 18-Jan-1966  Today's TOC FU Call Status: Today's TOC FU Call Status:: Successful TOC FU Call Completed TOC FU Call Complete Date: 04/16/24 Patient's Name and Date of Birth confirmed.  Transition Care Management Follow-up Telephone Call Date of Discharge: 04/15/24 [facility] Type of Discharge: Inpatient Admission Primary Inpatient Discharge Diagnosis:: Symptomatic bradycardia with intermittent tachycardia related to atrial fibrillation. How have you been since you were released from the hospital?: Better Any questions or concerns?: Yes Patient Questions/Concerns:: patient wants to know if he should be increasing Klor-Con  due to lab results showing potassium is low? Patient Questions/Concerns Addressed: Notified Provider of Patient Questions/Concerns  Items Reviewed: Did you receive and understand the discharge instructions provided?: Yes Medications obtained,verified, and reconciled?: Yes (Medications Reviewed) Any new allergies since your discharge?: No Dietary orders reviewed?: NA Do you have support at home?: Yes People in Home [RPT]: significant other  Medications Reviewed Today: Medications Reviewed Today     Reviewed by Lang Avelina PARAS, CMA (Certified Medical Assistant) on 04/16/24 at 1626  Med List Status: <None>   Medication Order Taking? Sig Documenting Provider Last Dose Status Informant  albuterol  (VENTOLIN  HFA) 108 (90 Base) MCG/ACT inhaler [id number] No INHALE 2 PUFFS BY MOUTH EVERY 6 HOURS AS NEEDED FOR COUGHING, WHEEZING, OR SHORTNESS OF BREATH  Patient taking differently: Inhale 2 puffs into the lungs every 6 (six) hours as needed for wheezing or shortness of breath (coughing). INHALE 2 PUFFS BY MOUTH EVERY 6 HOURS AS NEEDED FOR COUGHING, WHEEZING, OR SHORTNESS OF SHERIDA Comer Kirsch, PA-C 04/14/2024 Morning Active Pharmacy Records, Self, Spouse/Significant Other  ALPRAZolam   (XANAX ) 0.5 MG tablet [id number] No Take one twice a day and two at bedtime  Patient taking differently: Take 0.5-1 mg by mouth 3 (three) times daily. Take 1 tablet by mouth in the morning and in the evening, then take 2 tablets at bedtime.   Okey Barnie SAUNDERS, MD 04/14/2024 Evening Active Self, Spouse/Significant Other, Pharmacy Records           Med Note (WARD, CHUCK KANDICE Kitchens Apr 14, 2024  9:47 PM) Onnie 2 tablets twice already today  amLODipine  (NORVASC ) 10 MG tablet [id number] No Take 1 tablet (10 mg total) by mouth daily. Zhao, Xika, NP 04/14/2024 Morning Active Self, Pharmacy Records, Spouse/Significant Other  budesonide -formoterol  (SYMBICORT ) 160-4.5 MCG/ACT inhaler [id number] No Take 2 puffs first thing in am and then another 2 puffs about 12 hours later. Darlean Ozell NOVAK, MD 04/14/2024 Morning Active Self, Pharmacy Records, Spouse/Significant Other           Med Note (WARD, CHUCK KANDICE Kitchens Apr 14, 2024  9:56 PM)    EPINEPHrine  0.3 mg/0.3 mL IJ SOAJ injection [id number] No Inject 0.3 mg into the muscle as needed for anaphylaxis. Tobie Arleta SQUIBB, MD Unknown Active Self, Pharmacy Records, Spouse/Significant Other  esomeprazole  (NEXIUM ) 40 MG capsule [id number] No Take 1 capsule (40 mg total) by mouth 2 (two) times daily before a meal. Shirlean Therisa ORN, NP 04/14/2024 Morning Active Self, Spouse/Significant Other, Pharmacy Records  isosorbide  mononitrate (IMDUR ) 30 MG 24 hr tablet [id number] No Take 1 tablet (30 mg total) by mouth daily.  Patient taking differently: Take 30 mg by mouth daily as needed (chest discomfort).   Johnson Laymon HERO, PA-C Unknown Active Self, Pharmacy Records, Spouse/Significant Other           Med Note (WARD, ANGELICA G   Wed Apr 02, 2024  4:30 PM)    KLOR-CON  M20 20 MEQ tablet [id number] No TAKE 1 TABLET BY MOUTH EVERY DAY Dunn, Dayna N, PA-C 04/14/2024 Morning Active Self, Pharmacy Records, Spouse/Significant Other  lidocaine  (XYLOCAINE ) 2 % solution [id number] No Use as directed 15  mLs in the mouth or throat every 3 (three) hours as needed for mouth pain. Chandra Harlene LABOR, NP Unknown Active Self, Pharmacy Records, Spouse/Significant Other           Med Note (WARD, CHUCK MATSU   Wed Apr 02, 2024  4:30 PM)    metoprolol  tartrate (LOPRESSOR ) 25 MG tablet [id number]  Take 0.5 tablets (12.5 mg total) by mouth 2 (two) times daily. To take twice daily once HR>55bpm. Maree, Pratik D, DO  Active   nitroGLYCERIN  (NITROSTAT ) 0.4 MG SL tablet [id number] No Place 1 tablet (0.4 mg total) under the tongue every 5 (five) minutes x 3 doses as needed for chest pain (if no relief after 3rd dose, proceed to the ED for an evaluation). Waddell Danelle ORN, MD Unknown Active Pharmacy Records, Self, Spouse/Significant Other  rivaroxaban  (XARELTO ) 20 MG TABS tablet [id number] No TAKE 1 TABLET BY MOUTH ONCE DAILY WITH SUPPER  Patient taking differently: Take 20 mg by mouth daily.   Debera Jayson MATSU, MD 04/14/2024 10:00 AM Active Self, Pharmacy Records, Spouse/Significant Other  simvastatin  (ZOCOR ) 20 MG tablet [id number] No Take 1 tablet by mouth once daily  Patient taking differently: Take 20 mg by mouth daily at 6 PM.   Waddell Danelle ORN, MD 04/13/2024 Bedtime Active Self, Pharmacy Records, Spouse/Significant Other  vitamin B-12 (CYANOCOBALAMIN ) 50 MCG tablet [id number] No Take 50 mcg by mouth daily. [provider] 04/14/2024 Morning Active Pharmacy Records, Self, Spouse/Significant Other  VITAMIN D  PO [id number] No Take 1 tablet by mouth daily. [provider] 04/14/2024 Morning Active Pharmacy Records, Self, Spouse/Significant Other            Home Care and Equipment/Supplies: Were Home Health Services Ordered?: NA Any new equipment or medical supplies ordered?: NA  Functional Questionnaire: Do you need assistance with bathing/showering or dressing?: No Do you need assistance with meal preparation?: No Do you need assistance with eating?: No Do you have difficulty maintaining  continence: No Do you need assistance with getting out of bed/getting out of a chair/moving?: No Do you have difficulty managing or taking your medications?: No  Follow up appointments reviewed: PCP Follow-up appointment confirmed?: Yes Date of PCP follow-up appointment?: 04/28/24 Follow-up Provider: Bevely Doffing, FNP Specialist Hospital Follow-up appointment confirmed?: Yes Date of Specialist follow-up appointment?: 04/29/24 Follow-Up Specialty Provider:: Dr. Abigail -Cardiology Do you need transportation to your follow-up appointment?: No Do you understand care options if your condition(s) worsen?: Yes-patient verbalized understanding    SIGNATURE Avelina Essex, CMA (AAMA)  [facility] [phone]

## 2024-04-17 ENCOUNTER — Ambulatory Visit

## 2024-04-17 ENCOUNTER — Telehealth: Payer: Self-pay | Admitting: Cardiology

## 2024-04-17 NOTE — Telephone Encounter (Signed)
 Pts wife calling to request help setting up his heart monitor. Please advise.

## 2024-04-17 NOTE — Telephone Encounter (Signed)
 Pt coming 04/18/24 to have Zio monitor placed in office.

## 2024-04-18 ENCOUNTER — Other Ambulatory Visit: Payer: Self-pay | Admitting: Cardiology

## 2024-04-18 ENCOUNTER — Telehealth: Payer: Self-pay | Admitting: *Deleted

## 2024-04-18 ENCOUNTER — Ambulatory Visit: Attending: Internal Medicine | Admitting: *Deleted

## 2024-04-18 DIAGNOSIS — R0683 Snoring: Secondary | ICD-10-CM

## 2024-04-18 DIAGNOSIS — I482 Chronic atrial fibrillation, unspecified: Secondary | ICD-10-CM

## 2024-04-18 NOTE — Progress Notes (Signed)
Zio monitor placed  

## 2024-04-18 NOTE — Telephone Encounter (Signed)
 Pt states that while admitted in hospital he was told that he needed a sleep study. Pt and wife asking stating that they have not heard anything. Please advise.

## 2024-04-18 NOTE — Telephone Encounter (Signed)
 Referral placed.

## 2024-04-18 NOTE — Addendum Note (Signed)
 Addended by: Ausencio Vaden A on: 04/18/2024 05:27 PM   Modules accepted: Orders

## 2024-04-18 NOTE — Telephone Encounter (Signed)
 Prescription refill request for Xarelto  received.  Indication:afib Last office visit:9/25 Weight:75.1  kg Age:58 Scr:0.84  9/25 CrCl:100.58  ml/min  Prescription refilled

## 2024-04-21 ENCOUNTER — Ambulatory Visit (HOSPITAL_COMMUNITY)
Admission: RE | Admit: 2024-04-21 | Discharge: 2024-04-21 | Disposition: A | Discharge status: Home / Self Care | Source: Ambulatory Visit | Attending: Acute Care | Admitting: Acute Care

## 2024-04-21 ENCOUNTER — Ambulatory Visit (HOSPITAL_COMMUNITY)

## 2024-04-21 ENCOUNTER — Ambulatory Visit (HOSPITAL_COMMUNITY): Admission: RE | Admit: 2024-04-21 | Source: Ambulatory Visit

## 2024-04-21 DIAGNOSIS — K118 Other diseases of salivary glands: Secondary | ICD-10-CM | POA: Diagnosis not present

## 2024-04-21 MED ORDER — IOHEXOL 300 MG/ML  SOLN
75.0000 mL | Freq: Once | INTRAMUSCULAR | Status: AC | PRN
Start: 2024-04-21 — End: 2024-04-21
  Administered 2024-04-21: 75 mL via INTRAVENOUS

## 2024-04-22 ENCOUNTER — Ambulatory Visit
Admission: RE | Admit: 2024-04-22 | Discharge: 2024-04-22 | Disposition: A | Discharge status: Home / Self Care | Payer: Self-pay | Source: Ambulatory Visit | Attending: Nurse Practitioner | Admitting: Nurse Practitioner

## 2024-04-22 VITALS — BP 125/75 | Temp 98.1°F | Resp 18

## 2024-04-22 DIAGNOSIS — K0889 Other specified disorders of teeth and supporting structures: Secondary | ICD-10-CM | POA: Diagnosis not present

## 2024-04-22 MED ORDER — TRAMADOL HCL 50 MG PO TABS: 50.0000 mg | ORAL_TABLET | Freq: Two times a day (BID) | ORAL | 0 refills | Status: AC | PRN

## 2024-04-22 MED ORDER — CLINDAMYCIN HCL 300 MG PO CAPS: 300.0000 mg | ORAL_CAPSULE | Freq: Three times a day (TID) | ORAL | 0 refills | Status: DC

## 2024-04-22 NOTE — ED Triage Notes (Signed)
 Tooth pain on left lower side x 1 week.

## 2024-04-22 NOTE — Discharge Instructions (Addendum)
 Take medication as prescribed. You may take over-the-counter Tylenol  as needed for pain or discomfort. Warm salt water  gargles 3-4 times daily until symptoms improve. Apply warm compresses to the affected area to help with pain or discomfort.  Apply cool compresses to help with pain or swelling. I have provided a dental resource guide for you to follow-up with a dentist. Recommend following up with a dentist within the next 7 to 10 days or as soon as possible.

## 2024-04-22 NOTE — ED Provider Notes (Signed)
 [facility]    CSN: [id number] Arrival date & time: 04/22/24  1105      History   Chief Complaint Chief Complaint  Patient presents with   Abscess    Tooth pain and a little swelling - Entered by patient    HPI Richard [name] is a 58 y.o. male.   The history is provided by the patient and the spouse.   Patient presents with a 1 week history of pain in the left lower side of his mouth.  He states that he has several teeth and have already been pulled, but he states that he has 1 tooth in the lower left side that has been giving him pain for the past week.  He states that the pain radiates to the front of his mouth and into his lower teeth.  He also endorses intermittent left-sided facial swelling.  He denies fever, chills, fractured tooth, injury, trauma, chest pain, abdominal pain, nausea, vomiting, or diarrhea.  Patient states he has been gargling and peroxide and water  which helps his symptoms.  Patient states that he does have a dentist, but they keep rescheduling his dental cleaning.  He also states that he does have a pair of upper plates, but they do not fit his mouth well.  Past Medical History:  Diagnosis Date   Arthritis    Asthma    CAD (coronary artery disease)    a. Cardiac cath 07/2015 showed 65% distal Cx, 20% mid-distal LAD, 20% prox-distal RCA, EF 60%, EDP .   Colitis 1990   COPD (chronic obstructive pulmonary disease) (HCC)    Depression    Dysrhythmia    Essential hypertension    Fatty liver    Gastric ulcer 2003; 2012   2003: + esophagitis; negative H.pylori serology  2012: Dr. Harvey, mild gastritis, Bravo PH probe placement, negative H.pylori   GERD (gastroesophageal reflux disease)    Hepatic steatosis    History of hiatal hernia    Hyperlipemia    Overweight    Panic attacks    Paroxysmal atrial fibrillation (HCC)    Pulmonary nodules    Stroke [facility])    TIA (transient ischemic attack)    Type II diabetes mellitus [facility])      Patient Active Problem List   Diagnosis Date Noted   Chest pain 04/14/2024   Cough productive of purulent sputum 12/10/2023   Leg cramps 08/14/2023   Encounter for examination following treatment at hospital 08/14/2023   UTI (urinary tract infection) 07/03/2023   Prediabetes 06/19/2023   Dermatitis 06/19/2023   Fatigue 02/16/2023   Delayed wound healing 12/05/2022   Prostate cancer screening 11/10/2022   Arm pain, left 08/04/2022   Upper airway cough syndrome vs cough variant asthma 03/14/2022   Abnormal chest CT 11/16/2021   Pulmonary nodules 11/16/2021   RLQ abdominal pain 08/14/2021   Abnormal weight loss 08/14/2021   Acute ischemic stroke (HCC) 08/03/2021   Hypokalemia 08/03/2021   Hyperglycemia due to diabetes mellitus (HCC) 08/03/2021   Atrial fibrillation, chronic (HCC) 08/03/2021   Secondary hypercoagulable state 04/27/2021   Paroxysmal atrial fibrillation (HCC)    Lateral epicondylitis, left elbow 03/27/2019   Globus sensation 02/21/2018   Other cervical disc degeneration, unspecified cervical region 01/11/2018   Tendinitis of right triceps 01/11/2018   Neck pain 12/27/2017   Lateral epicondylitis, right elbow 05/10/2017   Constipation 12/24/2015   DOE (dyspnea on exertion) 11/17/2015   Coronary artery disease    Heart palpitations 08/12/2015  TIA (transient ischemic attack) 08/12/2015   Colon cancer screening 08/02/2015   Abdominal pain 12/18/2014   Encounter for screening colonoscopy 12/18/2014   Vitamin D  deficiency 08/20/2012   Arteriosclerotic cardiovascular disease (ASCVD) 04/11/2012   Dyspepsia 11/29/2011   Chronic low back pain    Dysphagia 05/09/2011   Essential hypertension    Anxiety and depression    Controlled diabetes mellitus type 2 with complications (HCC) 10/03/2010   Cigarette smoker 09/14/2010   COPD (chronic obstructive pulmonary disease) (HCC) 09/14/2010   Mixed hyperlipidemia 11/25/2009   GERD (gastroesophageal reflux disease)  04/05/2009   Hepatic steatosis 04/05/2009    Past Surgical History:  Procedure Laterality Date   BALLOON DILATION N/A 07/25/2021   Procedure: BALLOON DILATION;  Surgeon: Cindie Carlin POUR, DO;  [facility];  Service: Endoscopy;  Laterality: N/A;   BIOPSY  07/25/2021   Procedure: BIOPSY;  Surgeon: Cindie Carlin POUR, DO;  [facility];  Service: Endoscopy;;   BIOPSY  12/04/2022   Procedure: BIOPSY;  Surgeon: Cindie Carlin POUR, DO;  [facility];  Service: Endoscopy;;   BRAVO PH STUDY  05/03/2011   DOQ:Fpoi gastritis/normal esophagus and duodenum   CARDIAC CATHETERIZATION  1990s X 1; 2005; 08/12/2015   CARDIAC CATHETERIZATION N/A 08/12/2015   Procedure: Left Heart Cath and Coronary Angiography;  Surgeon: Victory LELON Sharps, MD; LAD 20%, CFX 65%, RCA 20%, EF 60%    COLONOSCOPY  1990   COLONOSCOPY WITH PROPOFOL  N/A 11/21/2016   Dr. Harvey: non-thrombosed external hemorrhoids, one 6 mm polyp (polypoid lesion), internal hemorrhoids. TI Normal. 10 years screening   ESOPHAGOGASTRODUODENOSCOPY  05/03/2011   DOQ:fpoi gastritis   ESOPHAGOGASTRODUODENOSCOPY (EGD) WITH PROPOFOL  N/A 07/25/2021   Procedure: ESOPHAGOGASTRODUODENOSCOPY (EGD) WITH PROPOFOL ;  Surgeon: Cindie Carlin POUR, DO;  [facility];  Service: Endoscopy;  Laterality: N/A;  1:30pm   ESOPHAGOGASTRODUODENOSCOPY (EGD) WITH PROPOFOL  N/A 12/04/2022   Procedure: ESOPHAGOGASTRODUODENOSCOPY (EGD) WITH PROPOFOL ;  Surgeon: Cindie Carlin POUR, DO;  [facility];  Service: Endoscopy;  Laterality: N/A;  8:00AM;ASA 3   NECK MASS EXCISION Right    done in dr's office; behind right ear/side of ncek   POLYPECTOMY  11/21/2016   Procedure: POLYPECTOMY;  Surgeon: Harvey Margo CROME, MD;  [facility];  Service: Endoscopy;;  descending colon polyp   SHOULDER ARTHROSCOPY W/ ROTATOR CUFF REPAIR Right 2006   acromioclavicular joint arthrosis       Home Medications    Prior to Admission medications    Medication Sig Start Date End Date Taking? Authorizing Provider  clindamycin  (CLEOCIN ) 300 MG capsule Take 1 capsule (300 mg total) by mouth 3 (three) times daily for 7 days. 04/22/24 04/29/24 Yes Leath-Warren, Etta PARAS, NP  traMADol (ULTRAM) 50 MG tablet Take 1 tablet (50 mg total) by mouth every 12 (twelve) hours as needed. 04/22/24  Yes Leath-Warren, Etta PARAS, NP  albuterol  (VENTOLIN  HFA) 108 (90 Base) MCG/ACT inhaler INHALE 2 PUFFS BY MOUTH EVERY 6 HOURS AS NEEDED FOR COUGHING, WHEEZING, OR SHORTNESS OF BREATH Patient taking differently: Inhale 2 puffs into the lungs every 6 (six) hours as needed for wheezing or shortness of breath (coughing). INHALE 2 PUFFS BY MOUTH EVERY 6 HOURS AS NEEDED FOR COUGHING, WHEEZING, OR SHORTNESS OF BREATH 06/19/22   Comer Kirsch, PA-C  ALPRAZolam  (XANAX ) 0.5 MG tablet Take one twice a day and two at bedtime Patient taking differently: Take 0.5-1 mg by mouth 3 (three) times daily. Take 1 tablet by mouth in the  morning and in the evening, then take 2 tablets at bedtime. 02/11/24   Okey Barnie SAUNDERS, MD  amLODipine  (NORVASC ) 10 MG tablet Take 1 tablet (10 mg total) by mouth daily. 02/03/24   Zhao, Xika, NP  budesonide -formoterol  (SYMBICORT ) 160-4.5 MCG/ACT inhaler Take 2 puffs first thing in am and then another 2 puffs about 12 hours later. 01/14/24   Darlean Ozell NOVAK, MD  EPINEPHrine  0.3 mg/0.3 mL IJ SOAJ injection Inject 0.3 mg into the muscle as needed for anaphylaxis. 09/24/23   Tobie Arleta SQUIBB, MD  esomeprazole  (NEXIUM ) 40 MG capsule Take 1 capsule (40 mg total) by mouth 2 (two) times daily before a meal. 04/08/24   Shirlean Therisa ORN, NP  isosorbide  mononitrate (IMDUR ) 30 MG 24 hr tablet Take 1 tablet (30 mg total) by mouth daily. Patient taking differently: Take 30 mg by mouth daily as needed (chest discomfort). 05/10/23   Strader, Laymon HERO, PA-C  KLOR-CON  M20 20 MEQ tablet TAKE 1 TABLET BY MOUTH EVERY DAY 07/19/23   Dunn, Dayna N, PA-C  lidocaine  (XYLOCAINE ) 2 %  solution Use as directed 15 mLs in the mouth or throat every 3 (three) hours as needed for mouth pain. 01/15/24   Chandra Harlene LABOR, NP  metoprolol  tartrate (LOPRESSOR ) 25 MG tablet Take 0.5 tablets (12.5 mg total) by mouth 2 (two) times daily. To take twice daily once HR>55bpm. 04/15/24 04/15/25  Maree, Pratik D, DO  nitroGLYCERIN  (NITROSTAT ) 0.4 MG SL tablet Place 1 tablet (0.4 mg total) under the tongue every 5 (five) minutes x 3 doses as needed for chest pain (if no relief after 3rd dose, proceed to the ED for an evaluation). 07/26/22   Waddell Danelle ORN, MD  rivaroxaban  (XARELTO ) 20 MG TABS tablet TAKE 1 TABLET BY MOUTH ONCE DAILY WITH SUPPER 04/18/24   Debera Jayson MATSU, MD  simvastatin  (ZOCOR ) 20 MG tablet Take 1 tablet by mouth once daily Patient taking differently: Take 20 mg by mouth daily at 6 PM. 06/22/23   Waddell Danelle ORN, MD  vitamin B-12 (CYANOCOBALAMIN ) 50 MCG tablet Take 50 mcg by mouth daily.    [provider]  VITAMIN D  PO Take 1 tablet by mouth daily.    [provider]    Family History Family History  Problem Relation Age of Onset   Lung cancer Mother    Alcohol abuse Mother    Heart attack Father 22   Diabetes Father    Alcohol abuse Father    Asthma Sister    Anxiety disorder Sister    Depression Sister    Anxiety disorder Sister    Hypertension Brother    Hypertension Brother    Heart attack Brother 15   Diabetes Brother    Hypertension Brother    Seizures Brother    Dementia Paternal Uncle    ADD / ADHD Daughter    Dementia Cousin    Colon cancer Neg Hx    Drug abuse Neg Hx    Bipolar disorder Neg Hx    OCD Neg Hx    Paranoid behavior Neg Hx    Schizophrenia Neg Hx    Sexual abuse Neg Hx    Physical abuse Neg Hx     Social History Social History   Tobacco Use   Smoking status: Every Day    Current packs/day: 0.50    Average packs/day: 1 pack/day for 41.8 years (40.1 ttl pk-yrs)    Types: Cigarettes    Start date: 07/17/1982    Smokeless  tobacco: Never   Tobacco comments:    1/2 pack a day 03/28/2024 KRD  Vaping Use   Vaping status: Never Used  Substance Use Topics   Alcohol use: Not Currently   Drug use: No     Allergies   Alpha-gal, Dexilant  [dexlansoprazole ], Mushroom ext cmplx(shiitake-reishi-mait), Penicillins, and Doxycycline   Review of Systems Review of Systems Per HPI  Physical Exam Triage Vital Signs ED Triage Vitals [04/22/24 1113]  Encounter Vitals Group     BP 125/75     Girls Systolic BP Percentile      Girls Diastolic BP Percentile      Boys Systolic BP Percentile      Boys Diastolic BP Percentile      Pulse      Resp 18     Temp 98.1 F (36.7 C)     Temp Source Oral     SpO2 96 %     Weight      Height      Head Circumference      Peak Flow      Pain Score 1     Pain Loc      Pain Education      Exclude from Growth Chart    No data found.  Updated Vital Signs BP 125/75 (BP Location: Right Arm)   Temp 98.1 F (36.7 C) (Oral)   Resp 18   SpO2 96%   Visual Acuity Right Eye Distance:   Left Eye Distance:   Bilateral Distance:    Right Eye Near:   Left Eye Near:    Bilateral Near:     Physical Exam Vitals and nursing note reviewed.  Constitutional:      General: He is not in acute distress.    Appearance: Normal appearance.  HENT:     Head: Normocephalic.     Right Ear: Tympanic membrane, ear canal and external ear normal.     Left Ear: Tympanic membrane, ear canal and external ear normal.     Nose: Nose normal.     Mouth/Throat:     Lips: Pink.     Mouth: Mucous membranes are moist.     Dentition: Dental tenderness and dental caries present. No gingival swelling.      Comments: Dental tenderness and dental caries present. Eyes:     Extraocular Movements: Extraocular movements intact.     Pupils: Pupils are equal, round, and reactive to light.  Cardiovascular:     Rate and Rhythm: Normal rate and regular rhythm.     Pulses: Normal pulses.     Heart  sounds: Normal heart sounds.  Pulmonary:     Effort: Pulmonary effort is normal.     Breath sounds: Normal breath sounds.  Musculoskeletal:     Cervical back: Normal range of motion.  Skin:    General: Skin is warm and dry.  Neurological:     General: No focal deficit present.     Mental Status: He is alert and oriented to person, place, and time.  Psychiatric:        Mood and Affect: Mood normal.        Behavior: Behavior normal.      UC Treatments / Results  Labs (all labs ordered are listed, but only abnormal results are displayed) Labs Reviewed - No data to display  EKG   Radiology    Procedures Procedures (including critical care time)  Medications Ordered in UC Medications - No data to display  Initial  Impression / Assessment and Plan / UC Course  I have reviewed the triage vital signs and the nursing notes.  Pertinent labs & imaging results that were available during my care of the patient were reviewed by me and considered in my medical decision making (see chart for details).  Will treat dentalgia with clindamycin  300 mg 3 times daily for the next 7 days.  Tramadol 50 mg prescribed for dental pain.  Supportive care recommendations were provided discussed with the patient to include over-the-counter Tylenol , warm salt water  gargles, and warm or cool compresses for pain, swelling, or general discomfort.  Patient was provided list of dental resources.  Recommend follow-up with a dentist within the next 7 to 10 days.  Patient was in agreement with this plan of care and verbalized understanding.  All questions were answered.  Patient stable for discharge.   Final Clinical Impressions(s) / UC Diagnoses   Final diagnoses:  Dentalgia     Discharge Instructions      Take medication as prescribed. You may take over-the-counter Tylenol  as needed for pain or discomfort. Warm salt water  gargles 3-4 times daily until symptoms improve. Apply warm compresses to the  affected area to help with pain or discomfort.  Apply cool compresses to help with pain or swelling. I have provided a dental resource guide for you to follow-up with a dentist. Recommend following up with a dentist within the next 7 to 10 days or as soon as possible.      ED Prescriptions     Medication Sig Dispense Auth. Provider   clindamycin  (CLEOCIN ) 300 MG capsule Take 1 capsule (300 mg total) by mouth 3 (three) times daily for 7 days. 21 capsule Leath-Warren, Etta PARAS, NP   traMADol (ULTRAM) 50 MG tablet Take 1 tablet (50 mg total) by mouth every 12 (twelve) hours as needed. 6 tablet Leath-Warren, Etta PARAS, NP      I have reviewed the PDMP during this encounter.   Gilmer Etta PARAS, NP 04/22/24 1156

## 2024-04-23 ENCOUNTER — Telehealth: Payer: Self-pay | Admitting: Family Medicine

## 2024-04-23 ENCOUNTER — Telehealth: Payer: Self-pay

## 2024-04-23 MED ORDER — CLINDAMYCIN PALMITATE HCL 75 MG/5ML PO SOLR: 300.0000 mg | Freq: Three times a day (TID) | ORAL | 0 refills | Status: AC

## 2024-04-23 NOTE — Telephone Encounter (Signed)
 Pt request liquid form of clindamycin . Sent.  Meds ordered this encounter  Medications   clindamycin  (CLEOCIN ) 75 MG/5ML solution    Sig: Take 20 mLs (300 mg total) by mouth 3 (three) times daily for 7 days.    Dispense:  420 mL    Refill:  0

## 2024-04-23 NOTE — Telephone Encounter (Signed)
 Pt calls stating that he needs liquid form of medications that were prescribed on yesterday. Pt would like them sent to [facility] [address]. Provider has been made aware.

## 2024-04-24 ENCOUNTER — Telehealth: Payer: Self-pay | Admitting: *Deleted

## 2024-04-24 NOTE — Telephone Encounter (Signed)
 The answer is that yes, the pt should reschedule until he has the chest CT. Can use nodule slot on Sarah's schedule. I called Christy back and there was no answer- LMTCB   Will route to admin pool to see if they can help with making the appt when she calls back, thanks!

## 2024-04-24 NOTE — Telephone Encounter (Signed)
 Copied from CRM #[id number]. Topic: Clinical - Medical Advice >> Apr 18, 2024 11:43 AM Nathanel DEL wrote: Reason for CRM: wife Bari calling to  advise pt was in the hospital 9/30 and left w/ a heart monitor.   The chest CT xray  cannot be done on 10/06 as ordered due to heart monitor.  CT chest has been rescheduled for 10/22.  But the neck CT will still be done 10/06. Pt has appt w/ Lauraine Lites on 10/17 to go over results (appt was to be for results of both).  Pt wants to know if he should reschedule his 10/17 appt w/ Sarah and reschedule until both reports are back, or keep the 10/17 appt to go over CT neck and then make another appt to go over ct chest after this report comes back. Please call wife Bari at  [phone] >> Apr 23, 2024  1:24 PM Rozanna MATSU wrote: Pt spouse calling back about the above message on his CT's and appts. Please contact pt

## 2024-04-25 NOTE — Telephone Encounter (Signed)
 Attempted to contact spouse to reschedule 10/17 appointment, had to leave voicemail

## 2024-04-28 ENCOUNTER — Ambulatory Visit (INDEPENDENT_AMBULATORY_CARE_PROVIDER_SITE_OTHER): Payer: Self-pay

## 2024-04-28 VITALS — BP 119/73 | HR 60 | Ht 72.0 in | Wt 168.0 lb

## 2024-04-28 DIAGNOSIS — E876 Hypokalemia: Secondary | ICD-10-CM

## 2024-04-28 DIAGNOSIS — I10 Essential (primary) hypertension: Secondary | ICD-10-CM

## 2024-04-28 DIAGNOSIS — I480 Paroxysmal atrial fibrillation: Secondary | ICD-10-CM | POA: Diagnosis not present

## 2024-04-28 DIAGNOSIS — Z09 Encounter for follow-up examination after completed treatment for conditions other than malignant neoplasm: Secondary | ICD-10-CM

## 2024-04-28 NOTE — Progress Notes (Unsigned)
 Cardiology Office Note    Date:  04/29/2024  ID:  Javin, Nong 1966-01-22, MRN [id number] PCP:  Edman Meade PEDLAR, FNP  Cardiologist:  Jayson Sierras, MD  Electrophysiologist:  Danelle Birmingham, MD   Chief Complaint: f/u chest pain, palpitations  History of Present Illness: .    Richard [name] is a 58 y.o. male with visit-pertinent history of CAD, chronic chest pain, post-cath TIA 2017, mild carotid artery disease, PAF, tobacco abuse, DM, arthritis, asthma, COPD, colitis, remote gastric ulcer, gastritis, GERD, hepatic steatosis, hiatal hernia, HTN, HLD (followed by PCP), overweight, severe anxiety, depression, panic attacks, pulmonary nodules, gastritis, presbyesophagus, alpha gall allergy  who is seen for f/u CP and palpitations. Symptomatology historically complex due to somatic symptoms across multiple body systems.   Coronary history: Cardiac cath 07/2015 showed 65% distal Cx, 20% mid-distal LAD, 20% prox-distal RCA, EF 60%, EDP . This was complicated by post-cath TIA. He has had frequent episodic re-testing for intermittent chest pain. Last nuc 6/20204 was normal followed by coronary CTA 04/2023 showing CAC 361, 25-49% pLAD, 50-69% m-dLAD, 25-49% oD1, 50-69% mD1, 1-24% oCx, 25-49% m-dCx, 50-69% p-dRCA, 25-49% mRCA, 25-49% PLA. FFR was positive in the most distal RCA and LAD, with plan for medical management. 2d echo 2023 EF 60-65%, no significant valve abnormalities.  Arrhythmia history: Regarding PAF, he remotely failed to maintain NSR with flecainide  and metoprolol . Ablation not pursued due to financial reasons. He was eventually switched to Multaq . He's had breakthrough PAF on Multaq  so Tikosyn previously offered but patient wished to continue with Multaq  (however recently d/c as below). He's been only on low dose BB due to baseline sinus bradycardia. Sleep study has been recommended several times in the past but patient has not yet pursued. Wife reports sleep consultation is  scheduled for November.  He was recently admitted 03/2024 for fatigue, chest pain, and palpitations. Multaq  was discontinued by Dr. Mallipeddi due to sinus bradycardia (HR 46-55). He was discharged on Imdur . Outpatient monitor was ordered which is in process. Troponins were normal. He is seen back for follow-up. He had a rare brief fleeting chest pain a few days after discharge, otherwise no significant angina. He occasionally feels palpitations after he's been active during the day then sitting down to rest, especially if bracing his back against something. However, no recurrent palpitations like what brought him in the hospital recently. He is a little over halfway through wearing the monitor. Continues to note significant baseline fatigue - has CT chest scheduled 05/07/24 to evaluate suspicious pulmonary nodule. His wife notes that any sort of checking of his numbers at home contributes to significant anxiety, so he's stopped checking. He also did not start Imdur  for fear of side effects. He also notes he may have to have a tooth removed at some point.  Labwork independently reviewed: 04/2024 K 4.3 03/2024 Mg 2.1, H/H/plt OK, Cr 0.84, K 3.7, TSh wnl 01/2024 LDL 63, trig 90, LFTs wnl  ROS: .    Please see the history of present illness.  All other systems are reviewed and otherwise negative.  Studies Reviewed: SABRA    EKG:  EKG not ordered today but reviewed from 04/14/24, NSR without acute STT changes  CV Studies: Cardiac studies reviewed are outlined and summarized above. Otherwise please see EMR for full report.   Current Reported Medications:.    Current Meds  Medication Sig   acetaminophen  (TYLENOL ) 325 MG tablet Take 650 mg by mouth every 6 (six) hours as needed  for moderate pain (pain score 4-6).   albuterol  (VENTOLIN  HFA) 108 (90 Base) MCG/ACT inhaler INHALE 2 PUFFS BY MOUTH EVERY 6 HOURS AS NEEDED FOR COUGHING, WHEEZING, OR SHORTNESS OF BREATH (Patient taking differently: Inhale 2 puffs  into the lungs every 6 (six) hours as needed for wheezing or shortness of breath (coughing). INHALE 2 PUFFS BY MOUTH EVERY 6 HOURS AS NEEDED FOR COUGHING, WHEEZING, OR SHORTNESS OF BREATH)   ALPRAZolam  (XANAX ) 0.5 MG tablet Take one twice a day and two at bedtime (Patient taking differently: Take 0.5-1 mg by mouth 3 (three) times daily. Take 1 tablet by mouth in the morning and in the evening, then take 2 tablets at bedtime.)   amLODipine  (NORVASC ) 10 MG tablet Take 1 tablet (10 mg total) by mouth daily.   budesonide -formoterol  (SYMBICORT ) 160-4.5 MCG/ACT inhaler Take 2 puffs first thing in am and then another 2 puffs about 12 hours later.   clindamycin  (CLEOCIN ) 75 MG/5ML solution Take 20 mLs (300 mg total) by mouth 3 (three) times daily for 7 days.   EPINEPHrine  0.3 mg/0.3 mL IJ SOAJ injection Inject 0.3 mg into the muscle as needed for anaphylaxis.   esomeprazole  (NEXIUM ) 40 MG capsule Take 1 capsule (40 mg total) by mouth 2 (two) times daily before a meal.   KLOR-CON  M20 20 MEQ tablet TAKE 1 TABLET BY MOUTH EVERY DAY   lidocaine  (XYLOCAINE ) 2 % solution Use as directed 15 mLs in the mouth or throat every 3 (three) hours as needed for mouth pain.   metoprolol  tartrate (LOPRESSOR ) 25 MG tablet Take 0.5 tablets (12.5 mg total) by mouth 2 (two) times daily. To take twice daily once HR>55bpm.   nitroGLYCERIN  (NITROSTAT ) 0.4 MG SL tablet Place 1 tablet (0.4 mg total) under the tongue every 5 (five) minutes x 3 doses as needed for chest pain (if no relief after 3rd dose, proceed to the ED for an evaluation).   rivaroxaban  (XARELTO ) 20 MG TABS tablet TAKE 1 TABLET BY MOUTH ONCE DAILY WITH SUPPER   simvastatin  (ZOCOR ) 20 MG tablet Take 1 tablet by mouth once daily   traMADol (ULTRAM) 50 MG tablet Take 1 tablet (50 mg total) by mouth every 12 (twelve) hours as needed.   vitamin B-12 (CYANOCOBALAMIN ) 50 MCG tablet Take 50 mcg by mouth daily.   VITAMIN D  PO Take 1 tablet by mouth daily.    Physical Exam:     VS:  BP 114/64   Pulse 64   Ht 6' (1.829 m)   Wt 168 lb 6.4 oz (76.4 kg)   SpO2 99%   BMI 22.84 kg/m    Wt Readings from Last 3 Encounters:  04/29/24 168 lb 6.4 oz (76.4 kg)  04/28/24 168 lb 0.6 oz (76.2 kg)  04/15/24 165 lb 9.1 oz (75.1 kg)    GEN: Well nourished, well developed in no acute distress NECK: No JVD; No carotid bruits CARDIAC: RRR, no murmurs, rubs, gallops RESPIRATORY:  Clear to auscultation without rales, wheezing or rhonchi  ABDOMEN: Soft, non-tender, non-distended EXTREMITIES:  No edema; No acute deformity   Asessement and Plan:.    1. Palpitations, hx of PAF and sinus bradycardia - continues to wear the event monitor ordered after recent hospitalization, has the remainder of this week before he sends in. He has had occasional palpitations but not like what brought him into the hospital recently. The event monitor result will go to ordering provider. If there is recurrent PAF or other arrhythmias, recommend return to EP to  discuss additional antiarrhythmic options. He remains on low dose metoprolol  12.5mg  BID and Xarelto  20mg  daily with supper. The patient also mentions he may need to have a tooth removed and states they might need input on clearance from our office. I asked him to have his dentist office send us  a clearance request with specific details if needed - if just a basic removal under local anesthesia, should be fine to proceed as we do not typically have to hold Xarelto  for 1-2 teeth extracted.  2. CAD - low burden of chest pain symptoms, no clear exertional angina. Continues to have baseline generalized fatigue which may be multifactorial. Awaiting evaluation for suspected OSA as well as suspicious pulmonary nodule. Troponins in the hospital were normal. He does not wish to take Imdur  due to fear of side effects. We will hold off and discontinue from medicine list, but would reconsider if chest pain burden returns. Otherwise he is not on ASA due to  concomitant Xarelto . Continue amlodipine  10mg  daily, Lopressor  12.5mg  BID, and simvastatin  20mg  daily. Lipids have been followed by PCP. Tobacco cessation also advised.  3. Suspected OSA - sleep  consultation is planned for November, encouraged to keep.  4. Essential HTN - controlled, no changes made today.    Disposition: F/u with Laymon Qua 06/2024 as scheduled in [place].  Signed, Tamaria Dunleavy N Gordan Grell, PA-C

## 2024-04-28 NOTE — Progress Notes (Signed)
 Established Patient Office Visit  Subjective   Patient ID: [name], male    DOB: August 28, 1965  Age: 58 y.o. MRN: [id number]  Chief Complaint  Patient presents with   Hospitalization Follow-up    Hospital follow up     HPI Assessment/Plan: d/c from hospital on 04/15/24   58 y.o. male with hx CAD with distal LAD/RCA disease by coronary CTA, stable angina on medical therapy, carotid artery disease, paroxysmal Afib, on AC, asthma, HTN, HLD, DM2, GERD, who presented with chest pain     Chest pain  Distal CAD by Coronary CTA  Acute + prolonged chest pain, atypical due to prolonged nature. Reports exertional + thinks may have been in Afib while symptomatic. EKG nonischemic. HS trop undetectable x 2. See prior cardiac eval above, most recent coronary eval in 10/'24 with distal disease in LAD / RCA. Possible anginal symptoms although seems less likely with prolonged nature and neg troponin, possible cervical / throacic disease considering his radicular type symptoms below. Possibly MSK with recent pain near the shoulder after hyperextending. GI felt less likely.  -- EDP spoke with cardiology fellow, recommending for observation and possible stress testing.   -- Routine cardiology consult in AM  -- S/p aspirin  324 mg at home, for now just continue [facility] [place] with Xarelto   -- Trial ISDN 5 mg TID for antianginal, BP is borderline, monitor  - For other antianginals amlodipine  10 mg, metoprolol  has recently been on hold with bradycardia -- Check lipids and A1c   Neck pain, ? Cervical radiculopathy  Reports chronic and intermittent neck pain, with pain sometimes radiating into the arm. He has dysesthesia along the volar surface of his hand / radial n distribution although sensation and strength is intact.  -- Recommend for OP evaluation including MRI of the C spine, think ok to hold off for now IP    Smoking cessation  -- Nicotine  lozenge prn at discharge, or low dose patch 7 mg, does not want in  hospital for now.  -- Counseled on smoking cessation esp with his concern about heart disease.    Sleep disordered breathing  -- Recommend OP eval with sleep study    Chronic medical problems  Carotid artery disease: See CAD above  Paroxysmal Afib: Currently SR,  Metoprolol  has been on hold OP due to bradycardia, continue home Dronedarone , continue Xarelto   Asthma: Nebs prn HTN: controlled, on amlodipine , trialing ISDN per above ; has ISMN at home but has not been using  HLD: Continue home simvastatin  DM2: Diet controlled, check A1c, SSI for very sensitive GERD: Continue home PPI   Patient Active Problem List   Diagnosis Date Noted   Chest pain 04/14/2024   Cough productive of purulent sputum 12/10/2023   Leg cramps 08/14/2023   Encounter for examination following treatment at hospital 08/14/2023   UTI (urinary tract infection) 07/03/2023   Prediabetes 06/19/2023   Dermatitis 06/19/2023   Fatigue 02/16/2023   Delayed wound healing 12/05/2022   Prostate cancer screening 11/10/2022   Arm pain, left 08/04/2022   Upper airway cough syndrome vs cough variant asthma 03/14/2022   Abnormal chest CT 11/16/2021   Pulmonary nodules 11/16/2021   RLQ abdominal pain 08/14/2021   Abnormal weight loss 08/14/2021   Acute ischemic stroke (HCC) 08/03/2021   Hypokalemia 08/03/2021   Hyperglycemia due to diabetes mellitus (HCC) 08/03/2021   Atrial fibrillation, chronic (HCC) 08/03/2021   Secondary hypercoagulable state 04/27/2021   Paroxysmal atrial fibrillation (HCC)    Lateral epicondylitis,  left elbow 03/27/2019   Globus sensation 02/21/2018   Other cervical disc degeneration, unspecified cervical region 01/11/2018   Tendinitis of right triceps 01/11/2018   Neck pain 12/27/2017   Lateral epicondylitis, right elbow 05/10/2017   Constipation 12/24/2015   DOE (dyspnea on exertion) 11/17/2015   Coronary artery disease    Heart palpitations 08/12/2015   TIA (transient ischemic attack)  08/12/2015   Colon cancer screening 08/02/2015   Abdominal pain 12/18/2014   Encounter for screening colonoscopy 12/18/2014   Vitamin D  deficiency 08/20/2012   Arteriosclerotic cardiovascular disease (ASCVD) 04/11/2012   Dyspepsia 11/29/2011   Chronic low back pain    Dysphagia 05/09/2011   Essential hypertension    Anxiety and depression    Controlled diabetes mellitus type 2 with complications (HCC) 10/03/2010   Cigarette smoker 09/14/2010   COPD (chronic obstructive pulmonary disease) (HCC) 09/14/2010   Mixed hyperlipidemia 11/25/2009   GERD (gastroesophageal reflux disease) 04/05/2009   Hepatic steatosis 04/05/2009    ROS    Objective:     BP 119/73   Pulse 60   Ht 6' (1.829 m)   Wt 168 lb 0.6 oz (76.2 kg)   SpO2 97%   BMI 22.79 kg/m  BP Readings from Last 3 Encounters:  04/29/24 114/64  04/28/24 119/73  04/22/24 125/75   Wt Readings from Last 3 Encounters:  04/29/24 168 lb 6.4 oz (76.4 kg)  04/28/24 168 lb 0.6 oz (76.2 kg)  04/15/24 165 lb 9.1 oz (75.1 kg)      Physical Exam Vitals and nursing note reviewed.  Constitutional:      Appearance: Normal appearance.  HENT:     Head: Normocephalic.  Eyes:     Extraocular Movements: Extraocular movements intact.     Pupils: Pupils are equal, round, and reactive to light.  Cardiovascular:     Rate and Rhythm: Normal rate and regular rhythm.  Pulmonary:     Effort: Pulmonary effort is normal.     Breath sounds: Normal breath sounds.  Musculoskeletal:     Cervical back: Normal range of motion and neck supple.  Neurological:     Mental Status: He is alert and oriented to person, place, and time.  Psychiatric:        Mood and Affect: Mood normal.        Thought Content: Thought content normal.        The ASCVD Risk score (Arnett DK, et al., 2019) failed to calculate for the following reasons:   Risk score cannot be calculated because patient has a medical history suggesting prior/existing ASCVD     Assessment & Plan:   Problem List Items Addressed This Visit       Cardiovascular and Mediastinum   Essential hypertension   Controlled, no change in medication      Relevant Orders   Basic Metabolic Panel (BMET) (Completed)   Paroxysmal atrial fibrillation (HCC)   Recent hospitalization for atrial fibrillation. Off Multaq  due to bradycardia. No episodes since. Resting heart rate 51 bpm. - Continue metoprolol  tartrate. - Cardiology follow-up tomorrow.        Other   Hypokalemia   Recent potassium levels 3.5 and 3.7 mmol/L. Potential over-supplementation after Multaq  discontinuation. - Order potassium level test. - Evaluate need for potassium supplementation based on results.      Other Visit Diagnoses       Hospital discharge follow-up    -  Primary      No follow-ups on file.    Leita  Arvilla Salada, FNP

## 2024-04-29 ENCOUNTER — Ambulatory Visit: Attending: Physician Assistant | Admitting: Physician Assistant

## 2024-04-29 ENCOUNTER — Other Ambulatory Visit: Payer: Self-pay | Admitting: *Deleted

## 2024-04-29 ENCOUNTER — Encounter: Payer: Self-pay | Admitting: Physician Assistant

## 2024-04-29 ENCOUNTER — Other Ambulatory Visit (HOSPITAL_COMMUNITY): Payer: Self-pay

## 2024-04-29 VITALS — BP 114/64 | HR 64 | Ht 72.0 in | Wt 168.4 lb

## 2024-04-29 DIAGNOSIS — R001 Bradycardia, unspecified: Secondary | ICD-10-CM | POA: Insufficient documentation

## 2024-04-29 DIAGNOSIS — R002 Palpitations: Secondary | ICD-10-CM | POA: Diagnosis not present

## 2024-04-29 DIAGNOSIS — I2510 Atherosclerotic heart disease of native coronary artery without angina pectoris: Secondary | ICD-10-CM | POA: Diagnosis not present

## 2024-04-29 DIAGNOSIS — I10 Essential (primary) hypertension: Secondary | ICD-10-CM | POA: Diagnosis not present

## 2024-04-29 DIAGNOSIS — R29818 Other symptoms and signs involving the nervous system: Secondary | ICD-10-CM | POA: Diagnosis not present

## 2024-04-29 DIAGNOSIS — I480 Paroxysmal atrial fibrillation: Secondary | ICD-10-CM | POA: Diagnosis not present

## 2024-04-29 LAB — BASIC METABOLIC PANEL WITH GFR
BUN/Creatinine Ratio: 14 (ref 9–20)
BUN: 13 mg/dL (ref 6–24)
CO2: 25 mmol/L (ref 20–29)
Calcium: 9.2 mg/dL (ref 8.7–10.2)
Chloride: 103 mmol/L (ref 96–106)
Creatinine, Ser: 0.93 mg/dL (ref 0.76–1.27)
Glucose: 127 mg/dL — ABNORMAL HIGH (ref 70–99)
Potassium: 4.3 mmol/L (ref 3.5–5.2)
Sodium: 140 mmol/L (ref 134–144)
eGFR: 95 mL/min/1.73 (ref >59)

## 2024-04-29 MED ORDER — POTASSIUM CHLORIDE CRYS ER 20 MEQ PO TBCR
20.0000 meq | EXTENDED_RELEASE_TABLET | Freq: Every day | ORAL | 3 refills | Status: DC
  Filled 2024-04-29 – 2024-05-05 (×2): qty 90, 90d supply, fill #0

## 2024-04-29 NOTE — Telephone Encounter (Signed)
 Patient in office for an appointment. Requested refill of potassium,as he was leaving the premises.  Refill as requested

## 2024-04-29 NOTE — Patient Instructions (Addendum)
 Medication Instructions:  No changes  *If you need a refill on your cardiac medications before your next appointment, please call your pharmacy*   Lab Work: Not needed If you have labs (blood work) drawn today and your tests are completely normal, you will receive your results only by: MyChart Message (if you have MyChart) OR A paper copy in the mail If you have any lab test that is abnormal or we need to change your treatment, we will call you to review the results.   Testing/Procedures: Not needed   Follow-Up: At [facility], you and your health needs are our priority.  As part of our continuing mission to provide you with exceptional heart care, we have created designated Provider Care Teams.  These Care Teams include your primary Cardiologist (physician) and Advanced Practice Providers (APPs -  Physician Assistants and Nurse Practitioners) who all work together to provide you with the care you need, when you need it.     Your next appointment:   keep your appt in Jun 26, 2024   The format for your next appointment:   In Person  Provider Laymon Qua, PA-C     Other Instructions

## 2024-05-02 ENCOUNTER — Ambulatory Visit: Admitting: Acute Care

## 2024-05-03 ENCOUNTER — Ambulatory Visit: Payer: Self-pay

## 2024-05-03 NOTE — Assessment & Plan Note (Signed)
 Recent potassium levels 3.5 and 3.7 mmol/L. Potential over-supplementation after Multaq  discontinuation. - Order potassium level test. - Evaluate need for potassium supplementation based on results.

## 2024-05-03 NOTE — Assessment & Plan Note (Signed)
 Controlled, no change in medication

## 2024-05-03 NOTE — Assessment & Plan Note (Signed)
 Recent hospitalization for atrial fibrillation. Off Multaq  due to bradycardia. No episodes since. Resting heart rate 51 bpm. - Continue metoprolol  tartrate. - Cardiology follow-up tomorrow.

## 2024-05-04 ENCOUNTER — Emergency Department (HOSPITAL_COMMUNITY)
Admission: EM | Admit: 2024-05-04 | Discharge: 2024-05-04 | Disposition: A | Discharge status: Home / Self Care | Attending: Emergency Medicine | Admitting: Emergency Medicine

## 2024-05-04 ENCOUNTER — Emergency Department (HOSPITAL_COMMUNITY)

## 2024-05-04 ENCOUNTER — Encounter (HOSPITAL_COMMUNITY): Payer: Self-pay

## 2024-05-04 ENCOUNTER — Other Ambulatory Visit: Payer: Self-pay

## 2024-05-04 DIAGNOSIS — F17200 Nicotine dependence, unspecified, uncomplicated: Secondary | ICD-10-CM | POA: Insufficient documentation

## 2024-05-04 DIAGNOSIS — R001 Bradycardia, unspecified: Secondary | ICD-10-CM | POA: Insufficient documentation

## 2024-05-04 DIAGNOSIS — R079 Chest pain, unspecified: Secondary | ICD-10-CM | POA: Insufficient documentation

## 2024-05-04 DIAGNOSIS — R002 Palpitations: Secondary | ICD-10-CM | POA: Diagnosis not present

## 2024-05-04 DIAGNOSIS — I10 Essential (primary) hypertension: Secondary | ICD-10-CM | POA: Diagnosis not present

## 2024-05-04 DIAGNOSIS — M549 Dorsalgia, unspecified: Secondary | ICD-10-CM | POA: Diagnosis not present

## 2024-05-04 DIAGNOSIS — M542 Cervicalgia: Secondary | ICD-10-CM | POA: Diagnosis not present

## 2024-05-04 DIAGNOSIS — Z79899 Other long term (current) drug therapy: Secondary | ICD-10-CM | POA: Insufficient documentation

## 2024-05-04 DIAGNOSIS — I4891 Unspecified atrial fibrillation: Secondary | ICD-10-CM | POA: Diagnosis not present

## 2024-05-04 DIAGNOSIS — I499 Cardiac arrhythmia, unspecified: Secondary | ICD-10-CM | POA: Diagnosis not present

## 2024-05-04 DIAGNOSIS — Z7901 Long term (current) use of anticoagulants: Secondary | ICD-10-CM | POA: Diagnosis not present

## 2024-05-04 DIAGNOSIS — R0789 Other chest pain: Secondary | ICD-10-CM | POA: Diagnosis not present

## 2024-05-04 DIAGNOSIS — Z743 Need for continuous supervision: Secondary | ICD-10-CM | POA: Diagnosis not present

## 2024-05-04 LAB — BASIC METABOLIC PANEL WITH GFR
Anion gap: 11 (ref 5–15)
BUN: 15 mg/dL (ref 6–20)
CO2: 24 mmol/L (ref 22–32)
Calcium: 8.6 mg/dL — ABNORMAL LOW (ref 8.9–10.3)
Chloride: 102 mmol/L (ref 98–111)
Creatinine, Ser: 0.86 mg/dL (ref 0.61–1.24)
GFR, Estimated: >60 mL/min (ref >60)
Glucose, Bld: 169 mg/dL — ABNORMAL HIGH (ref 70–99)
Potassium: 3.5 mmol/L (ref 3.5–5.1)
Sodium: 137 mmol/L (ref 135–145)

## 2024-05-04 LAB — CBC
HCT: 38.9 % — ABNORMAL LOW (ref 39.0–52.0)
Hemoglobin: 13.9 g/dL (ref 13.0–17.0)
MCH: 32.4 pg (ref 26.0–34.0)
MCHC: 35.7 g/dL (ref 30.0–36.0)
MCV: 90.7 fL (ref 80.0–100.0)
Platelets: 163 K/uL (ref 150–400)
RBC: 4.29 MIL/uL (ref 4.22–5.81)
RDW: 11.9 % (ref 11.5–15.5)
WBC: 5.9 K/uL (ref 4.0–10.5)
nRBC: 0 % (ref 0.0–0.2)

## 2024-05-04 LAB — TROPONIN T, HIGH SENSITIVITY
Troponin T High Sensitivity: <15 ng/L (ref 0–19)
Troponin T High Sensitivity: <15 ng/L (ref 0–19)

## 2024-05-04 NOTE — Discharge Instructions (Signed)
 Thankfully all of your testing has been totally normal, there is no signs of heart attack, your vital signs have shown that your heart rate is little bit on the slow side which is what we expect given the medications that you take.  You are not in atrial fibrillation, there is not appear to be any high risk features and you can follow-up with your heart doctor in the office.  Return for severe worsening symptoms but rest assured that your testing at night looks good

## 2024-05-04 NOTE — ED Triage Notes (Addendum)
 BIB [facility] for complaint of chest pain. Started yesterday, pt felt like heart was racing really fast; also reports feeling dizzy and lightheaded. Hx of A-fib. Sinus brady with EMS, 324 aspririn, 0.4 nitroglycerin  given. Pt also endorses neck pain 6/10 that started about the same time as chest pain.

## 2024-05-04 NOTE — ED Provider Notes (Signed)
 [place] EMERGENCY DEPARTMENT AT [facility] ([place]) Provider Note   CSN: [id number] Arrival date & time: 05/04/24  2047     Patient presents with: Chest Pain   Richard [name] is a 58 y.o. male.    Chest Pain  This patient is a 58 year old male, he has a history of atrial fibrillation paroxysmal, on Xarelto , he has a history of some hypertension, he takes amlodipine  and metoprolol , he has had some chest pain that has been intermittent, started last night, sharp and stabbing in the left chest, has also had some intermittent palpitations and a feeling of shortness of breath, that is seem to resolve and now he feels like his heartbeat is throbbing in his neck.  He has no vomiting but has been a little bit nauseated, no diarrhea, no swelling of the legs, he has been compliant with his medications.  He still smokes a couple cigarettes a day  The patient has had visits to the office including seeing his family medical doctor after recently being admitted to the hospital with chest pain in September approximately 2-1/2 weeks ago, during that time the patient had cardiac monitoring long-term, he does not have the results of that  He was admitted for approximately 24 hours, during that time the patient had been seen by cardiology, they recommended stopping his Multaq  and starting metoprolol  which he did.  He did have a CT coronary that was performed in October 2024 which did show some distal disease of the LAD and the RCA but not in the left circumflex     Prior to Admission medications   Medication Sig Start Date End Date Taking? Authorizing Provider  acetaminophen  (TYLENOL ) 325 MG tablet Take 650 mg by mouth every 6 (six) hours as needed for moderate pain (pain score 4-6).    [provider]  albuterol  (VENTOLIN  HFA) 108 (90 Base) MCG/ACT inhaler INHALE 2 PUFFS BY MOUTH EVERY 6 HOURS AS NEEDED FOR COUGHING, WHEEZING, OR SHORTNESS OF BREATH Patient taking differently: Inhale 2  puffs into the lungs every 6 (six) hours as needed for wheezing or shortness of breath (coughing). INHALE 2 PUFFS BY MOUTH EVERY 6 HOURS AS NEEDED FOR COUGHING, WHEEZING, OR SHORTNESS OF BREATH 06/19/22   Comer Kirsch, PA-C  ALPRAZolam  (XANAX ) 0.5 MG tablet Take one twice a day and two at bedtime Patient taking differently: Take 0.5-1 mg by mouth 3 (three) times daily. Take 1 tablet by mouth in the morning and in the evening, then take 2 tablets at bedtime. 02/11/24   Okey Barnie SAUNDERS, MD  amLODipine  (NORVASC ) 10 MG tablet Take 1 tablet (10 mg total) by mouth daily. 02/03/24   Zhao, Xika, NP  budesonide -formoterol  (SYMBICORT ) 160-4.5 MCG/ACT inhaler Take 2 puffs first thing in am and then another 2 puffs about 12 hours later. 01/14/24   Darlean Ozell NOVAK, MD  EPINEPHrine  0.3 mg/0.3 mL IJ SOAJ injection Inject 0.3 mg into the muscle as needed for anaphylaxis. 09/24/23   Tobie Arleta SQUIBB, MD  esomeprazole  (NEXIUM ) 40 MG capsule Take 1 capsule (40 mg total) by mouth 2 (two) times daily before a meal. 04/08/24   Shirlean Therisa ORN, NP  lidocaine  (XYLOCAINE ) 2 % solution Use as directed 15 mLs in the mouth or throat every 3 (three) hours as needed for mouth pain. 01/15/24   Chandra Harlene LABOR, NP  metoprolol  tartrate (LOPRESSOR ) 25 MG tablet Take 0.5 tablets (12.5 mg total) by mouth 2 (two) times daily. To take twice daily once HR>55bpm. 04/15/24  04/15/25  Maree, Pratik D, DO  nitroGLYCERIN  (NITROSTAT ) 0.4 MG SL tablet Place 1 tablet (0.4 mg total) under the tongue every 5 (five) minutes x 3 doses as needed for chest pain (if no relief after 3rd dose, proceed to the ED for an evaluation). 07/26/22   Waddell Danelle ORN, MD  potassium chloride  SA (KLOR-CON  M20) 20 MEQ tablet Take 1 tablet (20 mEq total) by mouth daily. 04/29/24   Dunn, Dayna N, PA-C  rivaroxaban  (XARELTO ) 20 MG TABS tablet TAKE 1 TABLET BY MOUTH ONCE DAILY WITH SUPPER 04/18/24   Debera Jayson MATSU, MD  simvastatin  (ZOCOR ) 20 MG tablet Take 1 tablet by mouth once  daily 06/22/23   Waddell Danelle ORN, MD  traMADol (ULTRAM) 50 MG tablet Take 1 tablet (50 mg total) by mouth every 12 (twelve) hours as needed. 04/22/24   Leath-Warren, Etta PARAS, NP  vitamin B-12 (CYANOCOBALAMIN ) 50 MCG tablet Take 50 mcg by mouth daily.    [provider]  VITAMIN D  PO Take 1 tablet by mouth daily.    [provider]    Allergies: Alpha-gal, Dexilant  [dexlansoprazole ], Mushroom ext cmplx(shiitake-reishi-mait), Penicillins, and Doxycycline    Review of Systems  Cardiovascular:  Positive for chest pain.  All other systems reviewed and are negative.   Updated Vital Signs BP 108/60   Pulse (!) 47   Temp 97.9 F (36.6 C) (Oral)   Resp 13   Ht 1.829 m (6')   Wt 76.4 kg   SpO2 95%   BMI 22.84 kg/m   Physical Exam Vitals and nursing note reviewed.  Constitutional:      General: He is not in acute distress.    Appearance: He is well-developed.  HENT:     Head: Normocephalic and atraumatic.     Mouth/Throat:     Pharynx: No oropharyngeal exudate.  Eyes:     General: No scleral icterus.       Right eye: No discharge.        Left eye: No discharge.     Conjunctiva/sclera: Conjunctivae normal.     Pupils: Pupils are equal, round, and reactive to light.  Neck:     Thyroid : No thyromegaly.     Vascular: No JVD.  Cardiovascular:     Rate and Rhythm: Regular rhythm. Bradycardia present.     Heart sounds: Normal heart sounds. No murmur heard.    No friction rub. No gallop.     Comments: Bradycardic to 55 bpm with normal pulses no JVD and no edema Pulmonary:     Effort: Pulmonary effort is normal. No respiratory distress.     Breath sounds: Normal breath sounds. No wheezing or rales.  Abdominal:     General: Bowel sounds are normal. There is no distension.     Palpations: Abdomen is soft. There is no mass.     Tenderness: There is no abdominal tenderness.  Musculoskeletal:        General: No tenderness. Normal range of motion.     Cervical back:  Normal range of motion and neck supple.     Right lower leg: No edema.     Left lower leg: No edema.  Lymphadenopathy:     Cervical: No cervical adenopathy.  Skin:    General: Skin is warm and dry.     Findings: No erythema or rash.  Neurological:     Mental Status: He is alert.     Coordination: Coordination normal.  Psychiatric:  Behavior: Behavior normal.     (all labs ordered are listed, but only abnormal results are displayed) Labs Reviewed  BASIC METABOLIC PANEL WITH GFR - Abnormal; Notable for the following components:      Result Value   Glucose, Bld 169 (*)    Calcium  8.6 (*)    All other components within normal limits  CBC - Abnormal; Notable for the following components:   HCT 38.9 (*)    All other components within normal limits  TROPONIN T, HIGH SENSITIVITY  TROPONIN T, HIGH SENSITIVITY    EKG: EKG Interpretation Date/Time:  Sunday May 04 2024 20:57:36 EDT Ventricular Rate:  53 PR Interval:  181 QRS Duration:  101 QT Interval:  432 QTC Calculation: 406 R Axis:   84  Text Interpretation: Sinus rhythm Consider left atrial enlargement Confirmed by Cleotilde Rogue ([id number]) on 05/04/2024 9:03:19 PM  Radiology: [facility] Chest 2 View Result Date: 05/04/2024 EXAM: 2 VIEW(S) XRAY OF THE CHEST 05/04/2024 09:24:00 PM COMPARISON: Portable chest 04/14/2024. CLINICAL HISTORY: chest pain. BIB [facility] for complaint of chest pain. Started yesterday, pt felt like heart was racing really fast; also reports feeling dizzy and lightheaded. Hx of A-fib. Sinus brady with EMS, 324 aspririn, 0.4 nitroglycerin  given. Pt also complains of neck pain ; 6/10 that started about the same time as chest pain. FINDINGS: LINES, TUBES AND DEVICES: Multiple overlying telemetry leads. LUNGS AND PLEURA: No focal pulmonary opacity. No pulmonary edema. No pleural effusion. No pneumothorax. HEART AND MEDIASTINUM: No acute abnormality of the cardiac and mediastinal silhouettes. BONES AND SOFT TISSUES:  Slight upper thoracic scoliosis. No acute osseous findings. IMPRESSION: 1. No acute cardiopulmonary findings. Electronically signed by: Francis Quam MD 05/04/2024 09:30 PM EDT RP Workstation: [id number]     Procedures   Medications Ordered in the ED - No data to display                                  Medical Decision Making Amount and/or Complexity of Data Reviewed Labs: ordered. Radiology: ordered.    This patient presents to the ED for concern of chest pain and palpitations, this involves an extensive number of treatment options, and is a complaint that carries with it a high risk of complications and morbidity.  The differential diagnosis includes thyroid  dysfunction although he had a normal TSH performed within the last month or so, could be coronary disease although although it seems atypical and he has a negative troponin, check a chest x-ray as well   Co morbidities / Chronic conditions that complicate the patient evaluation  Hypertension, tobacco use, A-fib, anticoagulated on Eliquis   Additional history obtained:  Additional history obtained from EMR External records from outside source obtained and reviewed including as above see prior coronary CT   Lab Tests:  I Ordered, and personally interpreted labs.  The pertinent results include: Metabolic panel with mild hyperglycemia, CBC is unremarkable, troponin is undetectably low   Imaging Studies ordered:  I ordered imaging studies including chest x-ray without any acute findings I independently visualized and interpreted imaging which showed no signs of pneumonia or pneumothorax or abnormal mediastinum I agree with the radiologist interpretation   Cardiac Monitoring: / EKG:  The patient was maintained on a cardiac monitor.  I personally viewed and interpreted the cardiac monitored which showed an underlying rhythm of: Mild sinus bradycardia   Problem List / ED Course / Critical interventions / Medication  management  Patient is well-appearing, workup is negative, heart rate is mildly bradycardic, no hypotension, no arrhythmia of any concern, no A-fib    Social Determinants of Health:  Tobacco use   Test / Admission - Considered:  Sitter at admission but negative workup, pati, ent appears stable for discharge has been having symptoms essentially all day long without any acute findings suggestive that this is not going to be related to a cardiac cause such as coronary obstruction      Final diagnoses:  Chest pain, unspecified type    ED Discharge Orders     None          Cleotilde Rogue, MD 05/04/24 2248

## 2024-05-05 ENCOUNTER — Ambulatory Visit (HOSPITAL_COMMUNITY): Admitting: Clinical

## 2024-05-05 ENCOUNTER — Other Ambulatory Visit (HOSPITAL_COMMUNITY): Payer: Self-pay

## 2024-05-05 ENCOUNTER — Other Ambulatory Visit: Payer: Self-pay

## 2024-05-05 ENCOUNTER — Telehealth: Payer: Self-pay | Admitting: Cardiology

## 2024-05-05 ENCOUNTER — Ambulatory Visit: Payer: Self-pay

## 2024-05-05 DIAGNOSIS — F4310 Post-traumatic stress disorder, unspecified: Secondary | ICD-10-CM | POA: Diagnosis not present

## 2024-05-05 DIAGNOSIS — F331 Major depressive disorder, recurrent, moderate: Secondary | ICD-10-CM

## 2024-05-05 DIAGNOSIS — F410 Panic disorder [episodic paroxysmal anxiety] without agoraphobia: Secondary | ICD-10-CM | POA: Diagnosis not present

## 2024-05-05 DIAGNOSIS — F419 Anxiety disorder, unspecified: Secondary | ICD-10-CM | POA: Diagnosis not present

## 2024-05-05 DIAGNOSIS — F32A Depression, unspecified: Secondary | ICD-10-CM | POA: Diagnosis not present

## 2024-05-05 DIAGNOSIS — F4001 Agoraphobia with panic disorder: Secondary | ICD-10-CM

## 2024-05-05 NOTE — Progress Notes (Signed)
 Virtual Visit via Video Note   I connected with Richard [name] on 05/05/24 at  11:00 AM EDT by a video enabled telemedicine application and verified that I am speaking with the correct person using two identifiers.   Location: Patient: home Provider: office   I discussed the limitations of evaluation and management by telemedicine and the availability of in person appointments. The patient expressed understanding and agreed to proceed.     THERAPIST PROGRESS NOTE     Session Time: 11:00 AM-11:45 AM   Participation Level: Active   Behavioral Response: Casual and Alert,Anxious   Type of Therapy: Individual Therapy   Treatment Goals addressed: Mood and Anxiety   Interventions: CBT   Summary: Richard [name] is a 58 y.o. male who presents with panic disorder/depression with anxiety/and PTSD . The OPT therapist worked with the patient for his OPT treatment. The OPT therapist utilized Motivational Interviewing to assist in creating therapeutic repore. The patient in the session was engaged and work in collaboration giving feedback about his triggers and symptoms over the past few weeks. The patient spoke about his chest pain leading to his ED evaluation last night due to chest pain The patient noted with the ED they checked to insure he didn't have a heart attack and took his blood work and this came back overall ok, however, the patients Potassium has been low and he noted having low energy and chest pain that led with to the ED. The patient was wearing a heart monitor and had completed his heart monitor testing prior to the episode that led to the ED visit yesterday.The patient will have a follow up with Caridac specialist to review results of his wearing the monitor. The OPT therapist utilized Cognitive Behavioral Therapy through cognitive restructuring as well as worked with the patient on coping strategies to assist in management of symptoms as well as reviewed sleep, eating habits, and  general health. The patient continues to work on acceptance of limited mobility based on health conditions and spoke about the impact of his physical health on his functioning. The OPT therapist worked with the patient on staying active and the patient spoke about with recent cooler weather getting out of the home and was recently doing some leaf blowing at home prior to the chest pain. The patient spoke about being in distress due to the needs of the home repairs. The patient spoke about hoping his son will be free to come help sometime before the end of the year.. The patient is going to prioritize repairs needed in the home moving into the Fall to improve the air quality and decrease negative impacts of mold.The patient has been using the Kills product to kill mold in the home and reduce breathing condition. The OPT therapist continued to urge the patient to pace his tasks and take interm breaks.The OPT therapist overviewed upcoming health appointments listed in the patients MyChart.   Suicidal/Homicidal: Nowithout intent/plan   Therapist Response:The OPT therapist worked with the patient for the patients scheduled session. The patient was engaged in his session and gave feedback in relation to triggers, symptoms, and behavior responses over the past few weeks.The patient spoke about the impact of the repairs that need to be completed to the home and the concern that mold in the home is creating/contributing to the patient and his wife's health problems with breathing. The patient has been utilizing the KILLZ mold killing product in the home to reduce the health risk in the  home. The patient spoke about his recent visit yesterday due to chest pain. The OPT therapist worked with the patient utilizing an in session Cognitive Behavioral Therapy exercise. The patient was responsive in the session and verbalized,  I used the [facility] product in a few places we had the mold..The OPT therapist worked with the patient  providing ongoing psychotherapy/education and coping skills review. The patient spoke about utilizing his support network of some of the neighbors who live near him and hope his son will be able to come and help him make repairs to the home to be able to manage the in home repairs and manage the mold to fix the environmental impacting factor on his breathing and help. The patient spoke about efforts to make good choices  by not pushing past his limits. The OPT therapist over-viewed with the patient upcoming appointments in MyChart including Cardiology, Pulmonology, and Medication Management. The OPT therapist will continue treatment work with the patient in his next scheduled session.   Plan: Return again in 2/3 weeks.   Diagnosis:      Axis I: PTSD/Panic Disorder/ Depression with anxiety  Axis II: No diagnosis      Collaboration of Care: Overview of the patients involvement in the Med Management program with Dr. Okey.   Patient/Guardian was advised Release of Information must be obtained prior to any record release in order to collaborate their care with an outside provider. Patient/Guardian was advised if they have not already done so to contact the registration department to sign all necessary forms in order for us  to release information regarding their care.    Consent: Patient/Guardian gives verbal consent for treatment and assignment of benefits for services provided during this visit. Patient/Guardian expressed understanding and agreed to proceed.    I discussed the assessment and treatment plan with the patient. The patient was provided an opportunity to ask questions and all were answered. The patient agreed with the plan and demonstrated an understanding of the instructions.   The patient was advised to call back or seek an in-person evaluation if the symptoms worsen or if the condition fails to improve as anticipated.   I provided 45 minutes of non-face-to-face time during this  encounter.   Richard ONEIDA Pepper, LCSW    05/05/2024

## 2024-05-05 NOTE — Telephone Encounter (Signed)
    He just saw Raphael Bring, PA in the office last Tuesday and they were awaiting his monitor results before making medication adjustments. While K+ was at 3.5 on 10/19, it was at 4.3 a few days prior. He can take an extra 20 mEq for one day and then continue on K-dur 20 mEq daily.   Signed, Laymon CHRISTELLA Qua, PA-C 05/05/2024, 6:43 PM Pager: [phone]

## 2024-05-05 NOTE — Telephone Encounter (Signed)
 Spoke with patient's wife, Floyd Wade, regarding recommendations after ED visit yesterday for chest pain. Per wife, patient believes symptoms are due to acid reflux and continues to have a "knot" sensation in throat, unchanged from last night. Wife has contacted GI for appointment but has not received a response. Reports no cardiac symptoms other than mild palpitations. Reports cardiology team advised potassium goal ~4.0; level was 3.5 last night. Patient taking potassium 20 mEq daily with no missed doses. Sooner appointment arranged with [facility] on 05/29/2024 @3 :30 pm. Will send message to provider for further recommendations on potassium. ER precautions reviewed; wife verbalized understanding.

## 2024-05-05 NOTE — Telephone Encounter (Signed)
 Pts spouse calling to state pt had an ED visit over the weekend. Pt has appt 12/11, but would like to speak about symptoms with nurse. Please advise.

## 2024-05-06 ENCOUNTER — Telehealth: Payer: Self-pay

## 2024-05-06 MED ORDER — SUCRALFATE 1 GM/10ML PO SUSP: 1.0000 g | Freq: Four times a day (QID) | ORAL | 1 refills | Status: DC

## 2024-05-06 NOTE — Telephone Encounter (Signed)
 Pt's wife phoned yesterday and today stating pt needed Carafate  sent to his pharmacy because he had to take Clindamycin  for 7 days and he states his reflux is about to kill him. I looked in his chart pt went to ED on 10/07 for tooth pain and they put him on 7 days of this antibiotic. Please advise

## 2024-05-06 NOTE — Telephone Encounter (Signed)
 I sent in carafate.

## 2024-05-06 NOTE — Telephone Encounter (Signed)
 Wife Bari notified and voiced understanding.

## 2024-05-06 NOTE — Addendum Note (Signed)
 Addended by: SHIRLEAN THERISA ORN on: 05/06/2024 05:05 PM   Modules accepted: Orders

## 2024-05-07 ENCOUNTER — Ambulatory Visit (HOSPITAL_COMMUNITY)
Admission: RE | Admit: 2024-05-07 | Discharge: 2024-05-07 | Disposition: A | Discharge status: Home / Self Care | Source: Ambulatory Visit | Attending: Acute Care | Admitting: Acute Care

## 2024-05-07 DIAGNOSIS — I700 Atherosclerosis of aorta: Secondary | ICD-10-CM | POA: Diagnosis not present

## 2024-05-07 DIAGNOSIS — R911 Solitary pulmonary nodule: Secondary | ICD-10-CM | POA: Diagnosis not present

## 2024-05-07 DIAGNOSIS — R918 Other nonspecific abnormal finding of lung field: Secondary | ICD-10-CM | POA: Diagnosis not present

## 2024-05-07 NOTE — Telephone Encounter (Signed)
 Noted,  Pt advised of Rx being sent in to his pharmacy.

## 2024-05-11 NOTE — Progress Notes (Addendum)
 GI Office Note    Referring Provider: Edman Meade PEDLAR, FNP Primary Care Physician:  Edman Meade PEDLAR, FNP  Primary Gastroenterologist: Carlin POUR. Cindie, DO   Chief Complaint   Chief Complaint  Patient presents with   Gastroesophageal Reflux    Having issues with reflux, states that it is causing him to have palpitations. Was on Carafate  but stopped taking due to his tooth bleeding, didn't know if it caused it.    History of Present Illness   Richard [name] is a 58 y.o. male presenting today for semi-urgent visit. Last seen in 01/2024 by NP Shirlean. Cardiology requested urgent follow up with GI. He has history of chronic GERD, abdominal pain, dysphagia, constipation, weight loss, esophageal dysmotility but unable to complete manometry due to transportation, hepatic steatosis (no fibrosis with ELF 8.58).   ED 02/05/24:  -central chest pain, radiating up his esophagus -bradycardia, decreased metoprolol  to 6.25 mg daily -wheezing -Chest xray with chronic hyperinflation and bronchial thickening.  ED 03/07/24: -chest pain, lightheaded, sweaty with trying to stand up -Trop neg -CXR chronic hyperinflation  Cardiology 03/25/24: -switched Toprol -XL from 6.25mg  daily to 12.5mg  prn palpitation rather than scheduled.  ED 04/02/24:  -Palpitations/dizziness -labs unremarkable  ED 04/14/24/overnight admission: -fatigue, palpitations, weakness,  -onset of left sided chest pain while with EMS -ACS ruled out -Multaq  d/c'd -metoprolol  12.5mg  BID once heart rate >55 bpm -Zio 2 week monitor  ED 05/04/24 -palpitations, sharp stabbing left chest pain, sob, heartbeat throbbing in neck -Milk 2% to lactose free, 45 minutes later knot in chest/swelling.    Discussed the use of AI scribe software for clinical note transcription with the patient, who gave verbal consent to proceed.   Complains of worsening suspected gastroesophageal symptoms after clindamycin  use.  He has been experiencing  significant gastrointestinal issues, particularly after completing a course of liquid clindamycin  two weeks ago. He describes sharp pains in the upper stomach and chest area.    He experiences a sensation of a 'knot' in the center of his chest, and he feels that this interferes with his heart rhythm, causing fluttering sensations. He has been to the hospital for these symptoms, where no atrial fibrillation was found. He describes a pressure that builds up in his chest, which he can relieve by making himself burp, sometimes resulting in regurgitation of food. When he has chest pain, he gets very anxious.   He has a history of alpha-gal syndrome, diagnosed in March 2023, and avoids red meat and dairy products. He recently switched to lactose-free milk after noticing that regular milk caused a 'knot' sensation in his chest after consumption. He also reports a history of dental issues, including root canals and extractions due to infections, which have impacted his ability to chew food properly.  He is currently taking Nexium  40 mg twice daily, which helps with typical heartburn symptoms but not with the current chest pressure. He has experienced issues with Nexium  pills sticking in his throat post-clindamycin . He previously had a reaction to clindamycin  in pill form, which caused burning in his esophagus which is why his recent RX was in liquid form.  His bowel movements are irregular, with a pattern of going every other day or sometimes skipping days, followed by multiple movements in one day. No diarrhea, but stools vary in consistency. He has experienced weight loss and reports decreased physical activity due to feeling winded easily.  He has a history of anxiety, which is exacerbated by his current symptoms, leading  to panic attacks when he feels his heart is affected. He smokes cigarettes but has reduced his consumption significantly. He also reports a history of mold exposure in his home and wonders if  playing a role.     Weight one year ago was 168 pounds but up to 176 pounds in 09/2023. Slow gradual weight loss since that time. Down to 164 pounds today.  Prior Data   EGD May 2024: abnormal esophageal motility, consistent with presbyesophagus, suspicious changes for Barrett's but negative path, gastritis, normal duodenum. Negative H.pylori.    EGD 07/2021: small hiatal hernia, abnormal esophageal motility, iron pill gastritis, no h.pylori, mucosal variant in duodenum (bx with focal lymphangiectasia, neg for celiac)    Last colonoscopy 2018, 1 benign polyp removed, recommended 10-year recall.    Medications   Current Outpatient Medications  Medication Sig Dispense Refill   acetaminophen  (TYLENOL ) 325 MG tablet Take 650 mg by mouth every 6 (six) hours as needed for moderate pain (pain score 4-6).     albuterol  (VENTOLIN  HFA) 108 (90 Base) MCG/ACT inhaler INHALE 2 PUFFS BY MOUTH EVERY 6 HOURS AS NEEDED FOR COUGHING, WHEEZING, OR SHORTNESS OF BREATH (Patient taking differently: Inhale 2 puffs into the lungs every 6 (six) hours as needed for wheezing or shortness of breath (coughing). INHALE 2 PUFFS BY MOUTH EVERY 6 HOURS AS NEEDED FOR COUGHING, WHEEZING, OR SHORTNESS OF BREATH) 20.1 g 0   ALPRAZolam  (XANAX ) 0.5 MG tablet Take one twice a day and two at bedtime (Patient taking differently: Take 0.5-1 mg by mouth 3 (three) times daily. Take 1 tablet by mouth in the morning and in the evening, then take 2 tablets at bedtime.) 120 tablet 2   amLODipine  (NORVASC ) 10 MG tablet Take 1 tablet (10 mg total) by mouth daily. 90 tablet 1   budesonide -formoterol  (SYMBICORT ) 160-4.5 MCG/ACT inhaler Take 2 puffs first thing in am and then another 2 puffs about 12 hours later. 1 each 12   EPINEPHrine  0.3 mg/0.3 mL IJ SOAJ injection Inject 0.3 mg into the muscle as needed for anaphylaxis. 2 each 1   esomeprazole  (NEXIUM ) 40 MG capsule Take 1 capsule (40 mg total) by mouth 2 (two) times daily before a meal. 180  capsule 3   lidocaine  (XYLOCAINE ) 2 % solution Use as directed 15 mLs in the mouth or throat every 3 (three) hours as needed for mouth pain. 100 mL 0   metoprolol  tartrate (LOPRESSOR ) 25 MG tablet Take 12.5 mg by mouth 2 (two) times daily.     nitroGLYCERIN  (NITROSTAT ) 0.4 MG SL tablet Place 1 tablet (0.4 mg total) under the tongue every 5 (five) minutes x 3 doses as needed for chest pain (if no relief after 3rd dose, proceed to the ED for an evaluation). 25 tablet 3   potassium chloride  SA (KLOR-CON  M20) 20 MEQ tablet Take 1 tablet (20 mEq total) by mouth daily. 90 tablet 3   rivaroxaban  (XARELTO ) 20 MG TABS tablet TAKE 1 TABLET BY MOUTH ONCE DAILY WITH SUPPER 90 tablet 1   simvastatin  (ZOCOR ) 20 MG tablet Take 1 tablet by mouth once daily 90 tablet 3   traMADol (ULTRAM) 50 MG tablet Take 1 tablet (50 mg total) by mouth every 12 (twelve) hours as needed. 6 tablet 0   vitamin B-12 (CYANOCOBALAMIN ) 50 MCG tablet Take 50 mcg by mouth daily.     VITAMIN D  PO Take 1 tablet by mouth daily.     No current facility-administered medications for this visit.  Allergies   Allergies as of 05/12/2024 - Review Complete 05/12/2024  Allergen Reaction Noted   Alpha-gal Anaphylaxis 12/14/2022   Dexilant  [dexlansoprazole ] Anaphylaxis 01/20/2015   Mushroom ext cmplx(shiitake-reishi-mait) Anaphylaxis 03/22/2011   Penicillins Anaphylaxis    Doxycycline Nausea And Vomiting 04/18/2016     Past Medical History   Past Medical History:  Diagnosis Date   Arthritis    Asthma    CAD (coronary artery disease)    a. Cardiac cath 07/2015 showed 65% distal Cx, 20% mid-distal LAD, 20% prox-distal RCA, EF 60%, EDP .   Colitis 1990   COPD (chronic obstructive pulmonary disease) (HCC)    Depression    Dysrhythmia    Essential hypertension    Fatty liver    Gastric ulcer 2003; 2012   2003: + esophagitis; negative H.pylori serology  2012: Dr. Harvey, mild gastritis, Bravo PH probe placement, negative H.pylori    GERD (gastroesophageal reflux disease)    Hepatic steatosis    History of hiatal hernia    Hyperlipemia    Overweight    Panic attacks    Paroxysmal atrial fibrillation (HCC)    Pulmonary nodules    Stroke (HCC)    TIA (transient ischemic attack)    Type II diabetes mellitus (HCC)     Past Surgical History   Past Surgical History:  Procedure Laterality Date   BALLOON DILATION N/A 07/25/2021   Procedure: BALLOON DILATION;  Surgeon: Cindie Carlin POUR, DO;  [facility];  Service: Endoscopy;  Laterality: N/A;   BIOPSY  07/25/2021   Procedure: BIOPSY;  Surgeon: Cindie Carlin POUR, DO;  [facility];  Service: Endoscopy;;   BIOPSY  12/04/2022   Procedure: BIOPSY;  Surgeon: Cindie Carlin POUR, DO;  [facility];  Service: Endoscopy;;   BRAVO PH STUDY  05/03/2011   DOQ:Fpoi gastritis/normal esophagus and duodenum   CARDIAC CATHETERIZATION  1990s X 1; 2005; 08/12/2015   CARDIAC CATHETERIZATION N/A 08/12/2015   Procedure: Left Heart Cath and Coronary Angiography;  Surgeon: Victory LELON Sharps, MD; LAD 20%, CFX 65%, RCA 20%, EF 60%    COLONOSCOPY  1990   COLONOSCOPY WITH PROPOFOL  N/A 11/21/2016   Dr. Harvey: non-thrombosed external hemorrhoids, one 6 mm polyp (polypoid lesion), internal hemorrhoids. TI Normal. 10 years screening   ESOPHAGOGASTRODUODENOSCOPY  05/03/2011   DOQ:fpoi gastritis   ESOPHAGOGASTRODUODENOSCOPY (EGD) WITH PROPOFOL  N/A 07/25/2021   Procedure: ESOPHAGOGASTRODUODENOSCOPY (EGD) WITH PROPOFOL ;  Surgeon: Cindie Carlin POUR, DO;  [facility];  Service: Endoscopy;  Laterality: N/A;  1:30pm   ESOPHAGOGASTRODUODENOSCOPY (EGD) WITH PROPOFOL  N/A 12/04/2022   Procedure: ESOPHAGOGASTRODUODENOSCOPY (EGD) WITH PROPOFOL ;  Surgeon: Cindie Carlin POUR, DO;  [facility];  Service: Endoscopy;  Laterality: N/A;  8:00AM;ASA 3   NECK MASS EXCISION Right    done in dr's office; behind right ear/side of ncek   POLYPECTOMY  11/21/2016   Procedure:  POLYPECTOMY;  Surgeon: Harvey Margo CROME, MD;  Location: [facility];  Service: Endoscopy;;  descending colon polyp   SHOULDER ARTHROSCOPY W/ ROTATOR CUFF REPAIR Right 2006   acromioclavicular joint arthrosis    Past Family History   Family History  Problem Relation Age of Onset   Lung cancer Mother    Alcohol abuse Mother    Heart attack Father 61   Diabetes Father    Alcohol abuse Father    Asthma Sister    Anxiety disorder Sister    Depression Sister    Anxiety disorder Sister  Hypertension Brother    Hypertension Brother    Heart attack Brother 36   Diabetes Brother    Hypertension Brother    Seizures Brother    Dementia Paternal Uncle    ADD / ADHD Daughter    Dementia Cousin    Colon cancer Neg Hx    Drug abuse Neg Hx    Bipolar disorder Neg Hx    OCD Neg Hx    Paranoid behavior Neg Hx    Schizophrenia Neg Hx    Sexual abuse Neg Hx    Physical abuse Neg Hx     Past Social History   Social History   Socioeconomic History   Marital status: Married    Spouse name: Not on file   Number of children: Not on file   Years of education: Not on file   Highest education level: Not on file  Occupational History   Occupation: full time    Employer: UNEMPLOYED  Tobacco Use   Smoking status: Every Day    Current packs/day: 0.50    Average packs/day: 1 pack/day for 41.8 years (40.2 ttl pk-yrs)    Types: Cigarettes    Start date: 07/17/1982   Smokeless tobacco: Never   Tobacco comments:    1/2 pack a day 03/28/2024 KRD  Vaping Use   Vaping status: Never Used  Substance and Sexual Activity   Alcohol use: Not Currently   Drug use: No   Sexual activity: Yes    Birth control/protection: None  Other Topics Concern   Not on file  Social History Narrative   Pt lives in [place] with wife.  5 children.  Unemployed due to panic attacks and back pain   Social Drivers of Health   Financial Resource Strain: Not on file  Food Insecurity: No Food Insecurity  (04/15/2024)   Hunger Vital Sign    Worried About Running Out of Food in the Last Year: Never true    Ran Out of Food in the Last Year: Never true  Transportation Needs: No Transportation Needs (04/15/2024)   PRAPARE - Administrator, Civil Service (Medical): No    Lack of Transportation (Non-Medical): No  Physical Activity: Not on file  Stress: Not on file  Social Connections: Not on file  Intimate Partner Violence: Not At Risk (04/15/2024)   Humiliation, Afraid, Rape, and Kick questionnaire    Fear of Current or Ex-Partner: No    Emotionally Abused: No    Physically Abused: No    Sexually Abused: No    Review of Systems   General: Negative for anorexia,   fever, chills, fatigue, weakness. See hpi ENT: Negative for hoarseness, difficulty swallowing , nasal congestion. CV: Negative for  angina,  dyspnea on exertion, peripheral edema. See hpi Respiratory: Negative for dyspnea at rest, dyspnea on exertion, cough, sputum, wheezing. See hpi GI: See history of present illness. GU:  Negative for dysuria, hematuria, urinary incontinence, urinary frequency, nocturnal urination.  Endo: Negative for unusual weight change.     Physical Exam   BP 137/72 (BP Location: Right Arm, Patient Position: Sitting, Cuff Size: Normal)   Pulse 66   Temp 98.4 F (36.9 C) (Oral)   Ht 6' (1.829 m)   Wt 164 lb 9.6 oz (74.7 kg)   SpO2 99%   BMI 22.32 kg/m    General: Well-nourished, well-developed in no acute distress. Accompanied by wife Eyes: No icterus. Mouth: Oropharyngeal mucosa moist and pink   Lungs: Clear to auscultation  bilaterally.  Heart: Regular rate and rhythm, no murmurs rubs or gallops.  Abdomen: Bowel sounds are normal,  nondistended, no hepatosplenomegaly or masses,  no abdominal bruits or hernia , no rebound or guarding. Mild epigastric tenderness, left flank tenderness Rectal: not performed Extremities: No lower extremity edema. No clubbing or deformities. Neuro: Alert  and oriented x 4   Skin: Warm and dry, no jaundice.   Psych: Alert and cooperative, normal mood and affect.  Labs   Lab Results  Component Value Date   NA 137 05/04/2024   CL 102 05/04/2024   K 3.5 05/04/2024   CO2 24 05/04/2024   BUN 15 05/04/2024   CREATININE 0.86 05/04/2024   GFRNONAA >60 05/04/2024   CALCIUM  8.6 (L) 05/04/2024   PHOS 4.9 (H) 04/15/2024   ALBUMIN 4.3 01/24/2024   GLUCOSE 169 (H) 05/04/2024   Lab Results  Component Value Date   ALT 16 01/24/2024   AST 14 01/24/2024   ALKPHOS 49 01/24/2024   BILITOT 0.4 01/24/2024   Lab Results  Component Value Date   WBC 5.9 05/04/2024   HGB 13.9 05/04/2024   HCT 38.9 (L) 05/04/2024   MCV 90.7 05/04/2024   PLT 163 05/04/2024   Lab Results  Component Value Date   TSH 0.747 04/14/2024    Imaging Studies   CT CHEST WO CONTRAST Result Date: 05/09/2024 CLINICAL DATA:  Follow-up lung nodule EXAM: CT CHEST WITHOUT CONTRAST TECHNIQUE: Multidetector CT imaging of the chest was performed following the standard protocol without IV contrast. RADIATION DOSE REDUCTION: This exam was performed according to the departmental dose-optimization program which includes automated exposure control, adjustment of the mA and/or kV according to patient size and/or use of iterative reconstruction technique. COMPARISON:  Chest x-ray 05/04/2024, chest CT 12/21/2023, PET CT 01/17/2024, chest CT 12/20/2022, 09/28/2021 FINDINGS: Cardiovascular: Limited assessment without intravenous contrast. Mild atherosclerosis. No aneurysm. Multi vessel coronary vascular calcification. Normal cardiac size. No pericardial effusion Mediastinum/Nodes: Patent trachea. No thyroid  mass. No suspicious lymph nodes. Esophagus within normal limits. Lungs/Pleura: No pleural effusion or pneumothorax. The previously noted irregular 6 mm anterior peripheral left lower lobe pulmonary nodule and 13 mm peripheral basilar left lower lobe pulmonary nodules have largely resolved. Mild  linear density in the left lung base at the site of the prior 13 mm pulmonary nodule. Stable slightly indistinct posterior left lower lobe pulmonary nodule on series 4, image 120 measuring 3 mm. Multiple new solid pulmonary nodules. Subpleural superior left lower lobe pulmonary nodule measuring 9 mm on series 4, image 79, with surrounding punctate nodules and mild ground-glass. Multiple punctate clustered subpleural pulmonary nodules in the peripheral left lower lobe, series 4 image 91 through 94 and 99 through 110. Largest nodule measures 6 mm on series 4, image 109. New slightly irregular pulmonary nodule within the anterior left lung base measuring 7 mm on series 4, image 123. Several new subpleural nodules within the medial left base, for example 5 mm nodule on series 4, image 98. Upper Abdomen: No acute finding Musculoskeletal: No acute or suspicious osseous abnormality. IMPRESSION: 1. The previously noted irregular 6 mm anterior peripheral left lower lobe pulmonary nodule and 13 mm peripheral basilar left lower lobe pulmonary nodules have largely resolved or are decreased, however multiple new solid pulmonary nodules within the left lower lobe, largest measuring 9 mm. Findings are favored to be infectious or inflammatory in etiology. Continued CT follow-up is recommended Aortic Atherosclerosis (ICD10-I70.0). Electronically Signed   By: Luke Bun M.D.   On:  05/09/2024 21:11   DG Chest 2 View Result Date: 05/04/2024 EXAM: 2 VIEW(S) XRAY OF THE CHEST 05/04/2024 09:24:00 PM COMPARISON: Portable chest 04/14/2024. CLINICAL HISTORY: chest pain. BIB [facility] for complaint of chest pain. Started yesterday, pt felt like heart was racing really fast; also reports feeling dizzy and lightheaded. Hx of A-fib. Sinus brady with EMS, 324 aspririn, 0.4 nitroglycerin  given. Pt also complains of neck pain ; 6/10 that started about the same time as chest pain. FINDINGS: LINES, TUBES AND DEVICES: Multiple overlying telemetry  leads. LUNGS AND PLEURA: No focal pulmonary opacity. No pulmonary edema. No pleural effusion. No pneumothorax. HEART AND MEDIASTINUM: No acute abnormality of the cardiac and mediastinal silhouettes. BONES AND SOFT TISSUES: Slight upper thoracic scoliosis. No acute osseous findings. IMPRESSION: 1. No acute cardiopulmonary findings. Electronically signed by: Francis Quam MD 05/04/2024 09:30 PM EDT RP Workstation: [id number]   CT SOFT TISSUE NECK W CONTRAST Result Date: 04/22/2024 CLINICAL DATA:  Left parotid mass EXAM: CT NECK WITH CONTRAST TECHNIQUE: Multidetector CT imaging of the neck was performed using the standard protocol following the bolus administration of intravenous contrast. RADIATION DOSE REDUCTION: This exam was performed according to the departmental dose-optimization program which includes automated exposure control, adjustment of the mA and/or kV according to patient size and/or use of iterative reconstruction technique. CONTRAST:  75mL OMNIPAQUE  IOHEXOL  300 MG/ML  SOLN COMPARISON:  CT PET January 17, 2024, CT August 03, 2021 FINDINGS: There is a 9 mm well-circumscribed round lesion at the junction of the superficial and deep lobes of the left parotid gland. The right parotid gland and the submandibular glands are normal. Pharynx: The nasopharynx, oropharynx and hypopharynx are normal Oral cavity/floor of mouth: Normal Larynx: Normal Thyroid : Normal Lymph nodes: No adenopathy Vascular: No significant abnormality Limited intracranial: No significant abnormality Visualized orbits: No significant abnormality Mastoids and visualized paranasal sinuses: No significant abnormality Skeleton: No significant abnormality Upper chest: No significant abnormality Other: None IMPRESSION: 9 mm well-circumscribed round mass at the junction of the superficial and deep lobes of the left parotid gland which correlates with the abnormality seen on the PET study. 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 Otherwise normal. Electronically Signed   By: Nancyann Burns M.D.   On: 04/22/2024 09:54   DG Chest Portable 1 View Result Date: 04/14/2024 EXAM: 1 VIEW(S) XRAY OF THE CHEST 04/14/2024 07:00:00 PM COMPARISON: 04/02/2024 CLINICAL HISTORY: Per triage: Pt c/o chest pain and palpitations. Ems states pt is going in and out of afib. Pt stated pain travels to left side of neck and has a burning like sensation in his left hand/ arm. FINDINGS: LUNGS AND PLEURA: No focal pulmonary opacity. No pulmonary edema. No pleural effusion. No pneumothorax. HEART AND MEDIASTINUM: No acute abnormality of the cardiac and mediastinal silhouettes. BONES AND SOFT TISSUES: No acute osseous abnormality. IMPRESSION: 1. No acute abnormalities. Electronically signed by: Norman Gatlin MD 04/14/2024 07:12 PM EDT RP Workstation: [id number]    Assessment/Plan:      Gastroesophageal reflux disease:  Symptoms exacerbated after clindamycin  use, presenting with sharp upper abdominal pain and sensation of swelling in the chest. No typical heartburn symptoms currently, but experiences regurgitation and difficulty swallowing pills. Previous endoscopy in 2023 showed gastritis. Current management with Nexium  40 mg twice daily is effective for heartburn but not for other symptoms. Carafate  previously helped but was stopped due to bleeding tooth, which may have been coincidental. -Considering  potential switch to Voquezna, but cautious due to past adverse reactions to similar medications. But for now Nexium  40mg  BID before meals is planned. - Restart Carafate , monitor for bleeding, likely unrelated. - Labs to evaluate abdominal pain.   - Consider endoscopy if symptoms persist and heart rate stabilizes. Patient reports significant bradycardia.  Constipation: Intermittent constipation with sensation of being backed up, despite several bowel movements per week.  - Trial of  Linzess  72mcg daily as needed.   Alpha-gal syndrome (mammalian meat allergy ): Recent dietary changes include switching to lactose-free milk, hoping it would help the pain in his chest after meals. Pain has persisted. Patient aware that he could be sensitive to dairy due to Alpha-Gal but drinking lactose free milk does not eliminate the cow protein that he could react to .    - If he suspects symptoms related to diary, including delayed responses such as expected with Alpha-gal, then he should avoid all dairy.    Total time spent with patient including face to face encounter, review of records, education, and documentation was 35 minutes.   Sonny RAMAN. Ezzard, MHS, PA-C [facility]

## 2024-05-12 ENCOUNTER — Ambulatory Visit: Admitting: Gastroenterology

## 2024-05-12 ENCOUNTER — Encounter: Payer: Self-pay | Admitting: Gastroenterology

## 2024-05-12 ENCOUNTER — Other Ambulatory Visit: Payer: Self-pay

## 2024-05-12 VITALS — BP 137/72 | HR 66 | Temp 98.4°F | Ht 72.0 in | Wt 164.6 lb

## 2024-05-12 DIAGNOSIS — K5900 Constipation, unspecified: Secondary | ICD-10-CM

## 2024-05-12 DIAGNOSIS — R002 Palpitations: Secondary | ICD-10-CM | POA: Diagnosis not present

## 2024-05-12 DIAGNOSIS — R1010 Upper abdominal pain, unspecified: Secondary | ICD-10-CM

## 2024-05-12 DIAGNOSIS — K219 Gastro-esophageal reflux disease without esophagitis: Secondary | ICD-10-CM | POA: Diagnosis not present

## 2024-05-12 MED ORDER — LINACLOTIDE 72 MCG PO CAPS: 72.0000 ug | ORAL_CAPSULE | Freq: Every day | ORAL | Status: AC

## 2024-05-12 MED ORDER — LINACLOTIDE 72 MCG PO CAPS: 72.0000 ug | ORAL_CAPSULE | Freq: Every day | ORAL | Status: DC

## 2024-05-12 NOTE — Patient Instructions (Addendum)
 Continue Nexium  40mg  twice daily before breakfast and supper.  Restart carafate  trying to take at least twice daily as needed to help with upper abdominal burning, feeling of fullness in the chest. Plan for short term use.   Complete labs at [facility]. We will be in touch with results and further recommendations.   Samples of Linzess  72 mcg provided today to help with constipation. You can take once daily as needed to help get some extra relief. It can be taken every day if you find you need to continue.   Avoid all beef, lamb, and pork. Consider avoiding all dairy (milk, cheese) due to Alpha Gal.

## 2024-05-12 NOTE — Progress Notes (Signed)
 Medication Samples have been provided to the patient.  Drug name: LINZESS        Strength:        Qty: 3  LOT: [id number]  Exp.Date: 2028/01  Dosing instructions: 1 CAP BY MOUTH DAILY 30 MINUTES BEFORE BREAKFAST  The patient has been instructed regarding the correct time, dose, and frequency of taking this medication, including desired effects and most common side effects.   Alzina Golda I Abanoub Hanken 12:08 PM 05/12/2024

## 2024-05-13 ENCOUNTER — Ambulatory Visit: Admitting: Acute Care

## 2024-05-13 ENCOUNTER — Encounter: Payer: Self-pay | Admitting: Acute Care

## 2024-05-13 ENCOUNTER — Telehealth (HOSPITAL_COMMUNITY): Admitting: Psychiatry

## 2024-05-13 VITALS — BP 128/72 | HR 53 | Temp 97.6°F | Ht 72.0 in | Wt 165.2 lb

## 2024-05-13 DIAGNOSIS — R9389 Abnormal findings on diagnostic imaging of other specified body structures: Secondary | ICD-10-CM | POA: Diagnosis not present

## 2024-05-13 DIAGNOSIS — K118 Other diseases of salivary glands: Secondary | ICD-10-CM

## 2024-05-13 DIAGNOSIS — F17200 Nicotine dependence, unspecified, uncomplicated: Secondary | ICD-10-CM

## 2024-05-13 DIAGNOSIS — F17210 Nicotine dependence, cigarettes, uncomplicated: Secondary | ICD-10-CM

## 2024-05-13 DIAGNOSIS — R911 Solitary pulmonary nodule: Secondary | ICD-10-CM

## 2024-05-13 DIAGNOSIS — J069 Acute upper respiratory infection, unspecified: Secondary | ICD-10-CM | POA: Diagnosis not present

## 2024-05-13 DIAGNOSIS — J449 Chronic obstructive pulmonary disease, unspecified: Secondary | ICD-10-CM | POA: Diagnosis not present

## 2024-05-13 MED ORDER — PREDNISONE 10 MG PO TABS: ORAL_TABLET | ORAL | 0 refills | Status: DC

## 2024-05-13 MED ORDER — AEROCHAMBER PLUS FLO-VU MISC: 0 refills | Status: AC

## 2024-05-13 MED ORDER — AZITHROMYCIN 250 MG PO TABS: ORAL_TABLET | ORAL | 0 refills | Status: DC

## 2024-05-13 NOTE — Progress Notes (Signed)
 History of Present Illness Richard [name] is a 58 y.o. male  current every day smoker ( 40 pack year smoker) followed through the lung cancer screening program, referred for lung nodule consult 12/2023 for an abnormal LDCT. He had a new irregular solid left lower lobe 13.1 pulmonary nodule, as well as a 6.6 mm left lower lobe pulmonary nodule neither of which were there 12 months ago. He will be followed by Dr. Shelah, and Lauraine Lites NP. He is seen in [place] by Dr. Darlean. He has a sleep consult with Dr. Catherine pending.   Pt. Was last seen 03/28/2024. At that time plan was for a 3 month follow up CT Chest to maintain surveillance of the pulmonary nodule as it was negative for hypermetabolic activity on PET.     05/13/2024 Discussed the use of AI scribe software for clinical note transcription with the patient, who gave verbal consent to proceed.  History of Present Illness Richard [name] is a 58 year old male with pulmonary nodules and a parotid mass  who presents for follow up imaging. We have reviewed his CT Chest and Soft Tissue neck.The parotid gland is stable and suspected to be benign. The CT Chest shows some resolved nodules and some new nodules. He has been attempting to remediate mold exposure in his home himself without wearing a mask due to difficulty breathing through it.  He experiences persistent shortness of breath and cough, leading to decreased physical activity and loss of muscle mass. He uses Symbicort  inhaler and is attempting to reduce smoking, with consumption ranging from two cigarettes to half a pack daily, depending on anxiety levels. He recalls a recent hospitalization where he received a steroid and breathing treatment, which alleviated his symptoms. No current wheezing.We again discussed smoking cessation.   He describes coughing up thick, dark-colored mucus, particularly in the mornings, and sometimes experiences a sensation of having inhaled a chemical, with  associated soreness and burning. He has a history of working with battery acid and is familiar with its effects.  He has been to the emergency room multiple times since June for heart-related issues, including episodes of bradycardia with heart rates dropping into the 30s and 40s. He monitors his heart rate at home when he feels 'off'.  He has a history of panic attacks, which have impacted his ability to work and drive long distances. He previously worked for [employer] but is currently unable to hold a job.     Test Results: CT Chest 05/09/2024 The previously noted irregular 6 mm anterior peripheral left lower lobe pulmonary nodule and 13 mm peripheral basilar left lower lobe pulmonary nodules have largely resolved or are decreased, however multiple new solid pulmonary nodules within the left lower lobe, largest measuring 9 mm. Findings are favored to be infectious or inflammatory in etiology. Continued CT follow-up is recommended  CT Soft Tissue Neck 04/21/2024 IMPRESSION: 9 mm well-circumscribed round mass at the junction of the superficial and deep lobes of the left parotid gland which correlates with the abnormality seen on the PET study. 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    PET Scan 01/17/2024 The nodular areas along the left lower lobe on the recent CT scan do not show abnormal uptake. Follow up CT scan 3 months may be useful. More aggressive approach could be considered as clinically appropriate.   There is a focal nodule  along the deep lobe of left parotid gland with abnormal uptake. Intrinsic parotid lesion is possible. Please correlate for known history or additional workup with MRI pre and post contrast may be useful to further delineation of a aggressive lesion.   Stable central mesenteric stranding. 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 Coronary artery calcifications. Please correlate for other coronary risk factors.   Slight wall thickening of the urinary bladder with a prominent prostate.   LDCT 12/21/2023 No significant thyroid  nodules. Unremarkable esophagus. No pathologically enlarged axillary, mediastinal or hilar lymph nodes, noting limited sensitivity for the detection of hilar adenopathy on this noncontrast study.   Lungs/Pleura: No pneumothorax. No pleural effusion. Mild centrilobular emphysema with diffuse bronchial wall thickening. No acute consolidative airspace disease or lung masses. New irregular solid anterior peripheral basilar left lower lobe 13.1 mm pulmonary nodule on series 4/image 265. New irregular solid 6.6 mm anterior peripheral left lower lobe pulmonary nodule on series 4/image 242. Indistinct 4.8 mm posterior left lower lobe nodule on series 4/image 225 is stable.   Upper abdomen: No acute abnormality.   Musculoskeletal: No aggressive appearing focal osseous lesions. Mild thoracic spondylosis.   IMPRESSION: 1. Lung-RADS 4B, suspicious. Additional imaging evaluation or consultation with Pulmonology or Thoracic Surgery recommended. New irregular solid 13.1 mm and 6.6 mm anterior peripheral left lower lobe pulmonary nodules. Suggest further evaluation with PET-CT at this time.      Latest Ref Rng & Units 05/04/2024    8:57 PM 04/15/2024    5:18 AM 04/14/2024    7:17 PM  CBC  WBC 4.0 - 10.5 K/uL 5.9  6.8  6.6   Hemoglobin 13.0 - 17.0 g/dL 86.0  86.3  85.5   Hematocrit 39.0 - 52.0 % 38.9  39.1  41.0   Platelets 150 - 400 K/uL 163  172  166        Latest Ref Rng & Units 05/04/2024    8:57 PM 04/28/2024   11:31 AM 04/15/2024    5:18 AM  BMP  Glucose 70 - 99 mg/dL 830  872  95   BUN 6 - 20 mg/dL 15  13  18    Creatinine 0.61 - 1.24 mg/dL 9.13  9.06  9.15   BUN/Creat Ratio 9 - 20  14    Sodium 135 - 145 mmol/L  137  140  139   Potassium 3.5 - 5.1 mmol/L 3.5  4.3  3.7   Chloride 98 - 111 mmol/L 102  103  104   CO2 22 - 32 mmol/L 24  25  27    Calcium  8.9 - 10.3 mg/dL 8.6  9.2  8.5     BNP    Component Value Date/Time   BNP 24.0 05/31/2018 1208    ProBNP    Component Value Date/Time   PROBNP <30.0 01/26/2008 0441    PFT    Component Value Date/Time   FEV1PRE 3.77 09/13/2023 1005   FEV1POST 3.89 09/13/2023 1005   FVCPRE 5.02 09/13/2023 1005   FVCPOST 5.15 09/13/2023 1005   TLC 7.86 09/13/2023 1005   DLCOUNC 29.57 09/13/2023 1005   PREFEV1FVCRT 75 09/13/2023 1005   PSTFEV1FVCRT 75 09/13/2023 1005    LONG TERM MONITOR-LIVE TELEMETRY (3-14 DAYS) Result Date: 05/12/2024   Patch wear time was for 14 days and 0 hours.   Normal sinus rhythm predominantly ranging from 40 to 141 bpm with an average HR 53 bpm.  1 run of nonsustained SVT occurred lasting for 5 beats and 1 run of nonsustained SVT with aberrancy occurred lasting 4 beats.   No evidence of A-fib/flutter, ventricular arrhythmias, high-grade AV block or pauses.   1.1% PAC burden and 1.5% PVC burden.   No patient triggered events were noted.   CT CHEST WO CONTRAST Result Date: 05/09/2024 CLINICAL DATA:  Follow-up lung nodule EXAM: CT CHEST WITHOUT CONTRAST TECHNIQUE: Multidetector CT imaging of the chest was performed following the standard protocol without IV contrast. RADIATION DOSE REDUCTION: This exam was performed according to the departmental dose-optimization program which includes automated exposure control, adjustment of the mA and/or kV according to patient size and/or use of iterative reconstruction technique. COMPARISON:  Chest x-ray 05/04/2024, chest CT 12/21/2023, PET CT 01/17/2024, chest CT 12/20/2022, 09/28/2021 FINDINGS: Cardiovascular: Limited assessment without intravenous contrast. Mild atherosclerosis. No aneurysm. Multi vessel coronary vascular calcification. Normal cardiac size. No pericardial effusion  Mediastinum/Nodes: Patent trachea. No thyroid  mass. No suspicious lymph nodes. Esophagus within normal limits. Lungs/Pleura: No pleural effusion or pneumothorax. The previously noted irregular 6 mm anterior peripheral left lower lobe pulmonary nodule and 13 mm peripheral basilar left lower lobe pulmonary nodules have largely resolved. Mild linear density in the left lung base at the site of the prior 13 mm pulmonary nodule. Stable slightly indistinct posterior left lower lobe pulmonary nodule on series 4, image 120 measuring 3 mm. Multiple new solid pulmonary nodules. Subpleural superior left lower lobe pulmonary nodule measuring 9 mm on series 4, image 79, with surrounding punctate nodules and mild ground-glass. Multiple punctate clustered subpleural pulmonary nodules in the peripheral left lower lobe, series 4 image 91 through 94 and 99 through 110. Largest nodule measures 6 mm on series 4, image 109. New slightly irregular pulmonary nodule within the anterior left lung base measuring 7 mm on series 4, image 123. Several new subpleural nodules within the medial left base, for example 5 mm nodule on series 4, image 98. Upper Abdomen: No acute finding Musculoskeletal: No acute or suspicious osseous abnormality. IMPRESSION: 1. The previously noted irregular 6 mm anterior peripheral left lower lobe pulmonary nodule and 13 mm peripheral basilar left lower lobe pulmonary nodules have largely resolved or are decreased, however multiple new solid pulmonary nodules within the left lower lobe, largest measuring 9 mm. Findings are favored to be infectious or inflammatory in etiology. Continued CT follow-up is recommended Aortic Atherosclerosis (ICD10-I70.0). Electronically Signed   By: Luke Bun M.D.   On: 05/09/2024 21:11   DG Chest 2 View Result Date: 05/04/2024 EXAM: 2 VIEW(S) XRAY OF THE CHEST 05/04/2024 09:24:00 PM COMPARISON: Portable chest 04/14/2024. CLINICAL HISTORY: chest pain. BIB [facility] for complaint of  chest pain. Started yesterday, pt felt like heart was racing really fast; also reports feeling dizzy and lightheaded. Hx of A-fib. Sinus brady with EMS, 324 aspririn, 0.4 nitroglycerin  given. Pt also complains of neck pain ; 6/10 that started about the same time as chest pain. FINDINGS: LINES, TUBES AND DEVICES: Multiple overlying telemetry leads. LUNGS AND PLEURA: No focal pulmonary opacity. No pulmonary edema. No pleural effusion. No pneumothorax. HEART AND MEDIASTINUM: No acute abnormality of the cardiac and mediastinal silhouettes. BONES AND SOFT TISSUES: Slight upper thoracic scoliosis. No acute osseous findings. IMPRESSION: 1. No acute cardiopulmonary findings. Electronically signed by: Francis Quam MD 05/04/2024 09:30 PM EDT RP Workstation: [id number]   CT SOFT TISSUE NECK W CONTRAST Result Date: 04/22/2024 CLINICAL DATA:  Left parotid mass EXAM: CT NECK WITH CONTRAST TECHNIQUE: Multidetector CT  imaging of the neck was performed using the standard protocol following the bolus administration of intravenous contrast. RADIATION DOSE REDUCTION: This exam was performed according to the departmental dose-optimization program which includes automated exposure control, adjustment of the mA and/or kV according to patient size and/or use of iterative reconstruction technique. CONTRAST:  75mL OMNIPAQUE  IOHEXOL  300 MG/ML  SOLN COMPARISON:  CT PET January 17, 2024, CT August 03, 2021 FINDINGS: There is a 9 mm well-circumscribed round lesion at the junction of the superficial and deep lobes of the left parotid gland. The right parotid gland and the submandibular glands are normal. Pharynx: The nasopharynx, oropharynx and hypopharynx are normal Oral cavity/floor of mouth: Normal Larynx: Normal Thyroid : Normal Lymph nodes: No adenopathy Vascular: No significant abnormality Limited intracranial: No significant abnormality Visualized orbits: No significant abnormality Mastoids and visualized paranasal sinuses: No significant  abnormality Skeleton: No significant abnormality Upper chest: No significant abnormality Other: None IMPRESSION: 9 mm well-circumscribed round mass at the junction of the superficial and deep lobes of the left parotid gland which correlates with the abnormality seen on the PET study. 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 Otherwise normal. Electronically Signed   By: Nancyann Burns M.D.   On: 04/22/2024 09:54   DG Chest Portable 1 View Result Date: 04/14/2024 EXAM: 1 VIEW(S) XRAY OF THE CHEST 04/14/2024 07:00:00 PM COMPARISON: 04/02/2024 CLINICAL HISTORY: Per triage: Pt c/o chest pain and palpitations. Ems states pt is going in and out of afib. Pt stated pain travels to left side of neck and has a burning like sensation in his left hand/ arm. FINDINGS: LUNGS AND PLEURA: No focal pulmonary opacity. No pulmonary edema. No pleural effusion. No pneumothorax. HEART AND MEDIASTINUM: No acute abnormality of the cardiac and mediastinal silhouettes. BONES AND SOFT TISSUES: No acute osseous abnormality. IMPRESSION: 1. No acute abnormalities. Electronically signed by: Norman Gatlin MD 04/14/2024 07:12 PM EDT RP Workstation: [id number]     Past medical hx Past Medical History:  Diagnosis Date   Arthritis    Asthma    CAD (coronary artery disease)    a. Cardiac cath 07/2015 showed 65% distal Cx, 20% mid-distal LAD, 20% prox-distal RCA, EF 60%, EDP .   Colitis 1990   COPD (chronic obstructive pulmonary disease) (HCC)    Depression    Dysrhythmia    Essential hypertension    Fatty liver    Gastric ulcer 2003; 2012   2003: + esophagitis; negative H.pylori serology  2012: Dr. Harvey, mild gastritis, Bravo PH probe placement, negative H.pylori   GERD (gastroesophageal reflux disease)    Hepatic steatosis    History of hiatal hernia    Hyperlipemia    Overweight    Panic attacks    Paroxysmal atrial fibrillation  (HCC)    Pulmonary nodules    Stroke (HCC)    TIA (transient ischemic attack)    Type II diabetes mellitus (HCC)      Social History   Tobacco Use   Smoking status: Every Day    Current packs/day: 0.50    Average packs/day: 1 pack/day for 41.8 years (40.2 ttl pk-yrs)    Types: Cigarettes    Start date: 07/17/1982   Smokeless tobacco: Never   Tobacco comments:    1/2 pack a day 05/13/2024 KRD    1 pack lasts pt 3 days   Vaping Use   Vaping status: Never Used  Substance Use  Topics   Alcohol use: Not Currently   Drug use: No    Mr.Spratling reports that he has been smoking cigarettes. He started smoking about 41 years ago. He has a 40.2 pack-year smoking history. He has never used smokeless tobacco. He reports that he does not currently use alcohol. He reports that he does not use drugs.  Tobacco Cessation: Ready to quit: Not Answered Counseling given: Not Answered Tobacco comments: 1/2 pack a day 05/13/2024 KRD 1 pack lasts pt 3 days  Current every day smoker  Smoker 1 Pack every 4 days  Past surgical hx, Family hx, Social hx all reviewed.  Current Outpatient Medications on File Prior to Visit  Medication Sig   acetaminophen  (TYLENOL ) 325 MG tablet Take 650 mg by mouth every 6 (six) hours as needed for moderate pain (pain score 4-6).   albuterol  (VENTOLIN  HFA) 108 (90 Base) MCG/ACT inhaler INHALE 2 PUFFS BY MOUTH EVERY 6 HOURS AS NEEDED FOR COUGHING, WHEEZING, OR SHORTNESS OF BREATH (Patient taking differently: Inhale 2 puffs into the lungs every 6 (six) hours as needed for wheezing or shortness of breath (coughing). INHALE 2 PUFFS BY MOUTH EVERY 6 HOURS AS NEEDED FOR COUGHING, WHEEZING, OR SHORTNESS OF BREATH)   ALPRAZolam  (XANAX ) 0.5 MG tablet Take one twice a day and two at bedtime (Patient taking differently: Take 0.5-1 mg by mouth 3 (three) times daily. Take 1 tablet by mouth in the morning and in the evening, then take 2 tablets at bedtime.)   amLODipine  (NORVASC ) 10 MG  tablet Take 1 tablet (10 mg total) by mouth daily.   budesonide -formoterol  (SYMBICORT ) 160-4.5 MCG/ACT inhaler Take 2 puffs first thing in am and then another 2 puffs about 12 hours later.   EPINEPHrine  0.3 mg/0.3 mL IJ SOAJ injection Inject 0.3 mg into the muscle as needed for anaphylaxis.   esomeprazole  (NEXIUM ) 40 MG capsule Take 1 capsule (40 mg total) by mouth 2 (two) times daily before a meal.   lidocaine  (XYLOCAINE ) 2 % solution Use as directed 15 mLs in the mouth or throat every 3 (three) hours as needed for mouth pain.   linaclotide  (LINZESS ) 72 MCG capsule Take 1 capsule (72 mcg total) by mouth daily before breakfast for 12 days.   metoprolol  tartrate (LOPRESSOR ) 25 MG tablet Take 12.5 mg by mouth 2 (two) times daily.   nitroGLYCERIN  (NITROSTAT ) 0.4 MG SL tablet Place 1 tablet (0.4 mg total) under the tongue every 5 (five) minutes x 3 doses as needed for chest pain (if no relief after 3rd dose, proceed to the ED for an evaluation).   potassium chloride  SA (KLOR-CON  M20) 20 MEQ tablet Take 1 tablet (20 mEq total) by mouth daily.   rivaroxaban  (XARELTO ) 20 MG TABS tablet TAKE 1 TABLET BY MOUTH ONCE DAILY WITH SUPPER   simvastatin  (ZOCOR ) 20 MG tablet Take 1 tablet by mouth once daily   traMADol (ULTRAM) 50 MG tablet Take 1 tablet (50 mg total) by mouth every 12 (twelve) hours as needed.   vitamin B-12 (CYANOCOBALAMIN ) 50 MCG tablet Take 50 mcg by mouth daily.   VITAMIN D  PO Take 1 tablet by mouth daily.   No current facility-administered medications on file prior to visit.     Allergies  Allergen Reactions   Alpha-Gal Anaphylaxis   Dexilant  [Dexlansoprazole ] Anaphylaxis   Mushroom Ext Cmplx(Shiitake-Reishi-Mait) Anaphylaxis    Rapid heart rate.   Penicillins Anaphylaxis    Immediate rash, facial/tongue/throat swelling, SOB or lightheadedness with hypotension   Doxycycline Nausea And  Vomiting         Review Of Systems:  Constitutional:   No  weight loss, night sweats,  Fevers,  chills, +fatigue, or  lassitude.  HEENT:   No headaches,  Difficulty swallowing,  Tooth/dental problems, or  Sore throat,                No sneezing, itching, ear ache, + nasal congestion, post nasal drip,   CV:  No chest pain,  Orthopnea, PND, swelling in lower extremities, anasarca, dizziness, palpitations, syncope.   GI  No heartburn, indigestion, abdominal pain, nausea, vomiting, diarrhea, change in bowel habits, loss of appetite, bloody stools.   Resp: + shortness of breath with exertion less at rest.  +  excess mucus, + productive cough,  No non-productive cough,  No coughing up of blood.  + change in color of mucus.  No wheezing.  No chest wall deformity  Skin: no rash or lesions.  GU: no dysuria, change in color of urine, no urgency or frequency.  No flank pain, no hematuria   MS:  No joint pain or swelling.  No decreased range of motion.  No back pain.  Psych:  No change in mood or affect. No depression or anxiety.  No memory loss.   Vital Signs BP 128/72   Pulse (!) 53   Temp 97.6 F (36.4 C) (Oral)   Ht 6' (1.829 m)   Wt 165 lb 3.2 oz (74.9 kg)   SpO2 98%   BMI 22.41 kg/m     Physical Exam GENERAL: No distress, alert and oriented times 3. EARS NOSE THROAT: No sinus tenderness, tympanic membranes clear, pale nasal mucosa, no oral exudate, no post nasal drip, no lymphadenopathy. CHEST: No wheeze, rales, dullness, no accessory muscle use, no nasal flaring, no sternal retractions.Chest congestion CARDIAC: S1, S2, regular rate and rhythm, no murmur. ABDOMINAL: Soft, non tender. ND, BS present,Body mass index is 22.41 kg/m.  EXTREMITIES: No clubbing, cyanosis, edema. No obvious deformities NEUROLOGICAL: Normal strength. Alert and oriented x 3, MAE x 4 SKIN: No rashes, warm and dry. No obvious skin lesions PSYCHIATRIC: Normal mood and behavior.    Assessment and Plan Assessment & Plan Pulmonary nodules, left lower lobe, likely infectious or inflammatory New solid  nodules in left lower lobe, likely infectious or inflammatory.  Previous nodules resolved or decreased.  Possible mold exposure correlation. Current every day smoker COPD Flare - Order follow-up CT chest in 3 months. - Follow up 1-2 weeks after to review results - Advise wearing a mask when dealing with mold. - Prescribe azithromycin (Z-Pak): 2 tablets on day 1, then 1 tablet daily for 4 days.  Obstructive airways disease with cough and dyspnea Minimal obstructive airways disease with cough and dyspnea per PFT. Symbicort  in use, but not using daily.  - Continue Symbicort . - Order spacer to use with inhaler - Prescribe prednisone  taper: 4 tablets for 2 days, 3 tablets for 2 days, 2 tablets for 2 days, 1 tablet for 2 days, then stop. - Albuterol  inhaler as needed for shortness of breath or wheezing.  Tobacco use disorder Continued smoking with variable reduction.  Likely contributing to respiratory symptoms. Plan Smoking Cessation Counseling:   1. The patient is an everyday smoker and symptomatic due to the following condition +++ 2. The patient is currently contemplating  quitting smoking. 3. I advised patient to quit smoking. 4. We identified patient specific barriers to change.  5. I personally spent 3 minutes counseling the patient regarding  tobacco use disorder. 6. We discussed management of stress and anxiety to help with smoking cessation, when applicable. 7. We discussed nicotine  replacement therapy, Wellbutrin, Chantix as possible options. 8. I advised setting a quit date. 9. Follow?up arranged with our office to continue ongoing discussions. 10.Resources given to patient including quit hotline.    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 Follow up CT Soft Tissue  in 12 months   I spent 35 minutes dedicated to the care of this patient on the date of this encounter to include pre-visit review of records,  face-to-face time with the patient discussing conditions above, post visit ordering of testing, clinical documentation with the electronic health record, making appropriate referrals as documented, and communicating necessary information to the patient's healthcare team.      Lauraine JULIANNA Lites, NP 05/13/2024  9:39 AM

## 2024-05-13 NOTE — Patient Instructions (Addendum)
 It is good to see you today. We have reviewed your CT chest and CT soft tissue neck. The CT chest shows resolved nodules as well as new nodules. We will do a 73-month follow-up CT chest to reevaluate these nodules. Please make sure you wear a mask while you are dealing with the mold in your home. Your CT soft tissue neck shows the parotid nodule we have been following is stable in size. They suspect this is benign. We will do a follow-up CT soft tissue neck in 12 months. We have prescribed a Z-Pak for your upper respiratory infection. Take 2 tablets today and 1 tablet daily for the next 4 days until gone. I have prescribed a prednisone  taper. Prednisone  taper; 10 mg tablets: 4 tabs x 2 days, 3 tabs x 2 days, 2 tabs x 2 days 1 tab x 2 days then stop.  We will send in a prescription for a spacer to use with your symbicort . Symbicort  2 puffs in the morning and 2 puffs in the evening. Rinse mouth after use. Albuterol  as needed for shortness of breath or wheezing. Follow up in 3 months after the CT Chest.  Call to be seen sooner if you need us . Call for any unexplained weight loss or blood in your sputum when you cough. Note your daily symptoms > remember red flags for COPD:  Increase in cough, increase in sputum production, increase in shortness of breath or activity intolerance. If you notice these symptoms, please call to be seen.   Please contact office for sooner follow up if symptoms do not improve or worsen or seek emergency care  You can receive free nicotine  replacement therapy (patches, gum, or mints) by calling 1-800-QUIT NOW. Please call so we can get you on the path to becoming a non-smoker. I know it is hard, but you can do this!  Hypnosis for smoking cessation  [employer] [phone]  Acupuncture for smoking cessation  [employer] [phone]

## 2024-05-14 ENCOUNTER — Telehealth: Payer: Self-pay | Admitting: Cardiology

## 2024-05-14 NOTE — Telephone Encounter (Signed)
 Pt c/o medication issue:  1. Name of Medication: prednsione 40mg  for 2 days, then 30mg  2 days, 20mg  for two days then 10 for days.  2. How are you currently taking this medication (dosage and times per day)?   3. Are you having a reaction (difficulty breathing--STAT)?   4. What is your medication issue? Wants to make sure it's okay to take.

## 2024-05-14 NOTE — Telephone Encounter (Signed)
 Spoke with wife who states that pt is started pt is on steroids d/t have lung nodules. Asking if this is ok to take

## 2024-05-15 DIAGNOSIS — K219 Gastro-esophageal reflux disease without esophagitis: Secondary | ICD-10-CM | POA: Diagnosis not present

## 2024-05-15 DIAGNOSIS — K5900 Constipation, unspecified: Secondary | ICD-10-CM | POA: Diagnosis not present

## 2024-05-15 NOTE — Telephone Encounter (Signed)
 Pt wife Bari notified and voiced understanding.

## 2024-05-15 NOTE — Telephone Encounter (Signed)
 Wife called to follow up on patient's medication issue.

## 2024-05-16 ENCOUNTER — Telehealth (INDEPENDENT_AMBULATORY_CARE_PROVIDER_SITE_OTHER): Admitting: Psychiatry

## 2024-05-16 ENCOUNTER — Encounter (HOSPITAL_COMMUNITY): Payer: Self-pay | Admitting: Psychiatry

## 2024-05-16 DIAGNOSIS — F4310 Post-traumatic stress disorder, unspecified: Secondary | ICD-10-CM

## 2024-05-16 DIAGNOSIS — F4001 Agoraphobia with panic disorder: Secondary | ICD-10-CM

## 2024-05-16 LAB — CBC WITH DIFFERENTIAL/PLATELET
Basophils Absolute: 0 x10E3/uL (ref 0.0–0.2)
Basos: 0 %
EOS (ABSOLUTE): 0.2 x10E3/uL (ref 0.0–0.4)
Eos: 3 %
Hematocrit: 44.5 % (ref 37.5–51.0)
Hemoglobin: 15 g/dL (ref 13.0–17.7)
Immature Grans (Abs): 0 x10E3/uL (ref 0.0–0.1)
Immature Granulocytes: 0 %
Lymphocytes Absolute: 2.4 x10E3/uL (ref 0.7–3.1)
Lymphs: 38 %
MCH: 31.7 pg (ref 26.6–33.0)
MCHC: 33.7 g/dL (ref 31.5–35.7)
MCV: 94 fL (ref 79–97)
Monocytes Absolute: 0.5 x10E3/uL (ref 0.1–0.9)
Monocytes: 8 %
Neutrophils Absolute: 3.1 x10E3/uL (ref 1.4–7.0)
Neutrophils: 51 %
Platelets: 192 x10E3/uL (ref 150–450)
RBC: 4.73 x10E6/uL (ref 4.14–5.80)
RDW: 12.1 % (ref 11.6–15.4)
WBC: 6.2 x10E3/uL (ref 3.4–10.8)

## 2024-05-16 LAB — COMPREHENSIVE METABOLIC PANEL WITH GFR
ALT: 10 IU/L (ref 0–44)
AST: 11 IU/L (ref 0–40)
Albumin: 4.4 g/dL (ref 3.8–4.9)
Alkaline Phosphatase: 52 IU/L (ref 47–123)
BUN/Creatinine Ratio: 15 (ref 9–20)
BUN: 13 mg/dL (ref 6–24)
Bilirubin Total: 0.5 mg/dL (ref 0.0–1.2)
CO2: 25 mmol/L (ref 20–29)
Calcium: 9.4 mg/dL (ref 8.7–10.2)
Chloride: 102 mmol/L (ref 96–106)
Creatinine, Ser: 0.85 mg/dL (ref 0.76–1.27)
Globulin, Total: 2 g/dL (ref 1.5–4.5)
Glucose: 94 mg/dL (ref 70–99)
Potassium: 4.2 mmol/L (ref 3.5–5.2)
Sodium: 142 mmol/L (ref 134–144)
Total Protein: 6.4 g/dL (ref 6.0–8.5)
eGFR: 100 mL/min/1.73 (ref >59)

## 2024-05-16 LAB — LIPASE: Lipase: 15 U/L (ref 13–78)

## 2024-05-16 MED ORDER — ALPRAZOLAM 0.5 MG PO TABS: ORAL_TABLET | ORAL | 2 refills | Status: DC

## 2024-05-16 NOTE — Progress Notes (Signed)
 Virtual Visit via Video Note  I connected with Richard [name] on 05/16/24 at 11:40 AM EDT by a video enabled telemedicine application and verified that I am speaking with the correct person using two identifiers.  Location: Patient: home Provider: office   I discussed the limitations of evaluation and management by telemedicine and the availability of in person appointments. The patient expressed understanding and agreed to proceed.     I discussed the assessment and treatment plan with the patient. The patient was provided an opportunity to ask questions and all were answered. The patient agreed with the plan and demonstrated an understanding of the instructions.   The patient was advised to call back or seek an in-person evaluation if the symptoms worsen or if the condition fails to improve as anticipated.  I provided 20 minutes of non-face-to-face time during this encounter.   Barnie Gull, MD  [facility] MD/PA/NP OP Progress Note  05/16/2024 11:54 AM Richard [name]  MRN:  [id number]  Chief Complaint:  Chief Complaint  Patient presents with   Anxiety   Depression   Follow-up   HPI: This patient is a 58 year old white male lives with his wife in [place].  He is on unemployment and also gets SSI disability.   The patient returns for follow-up after 3 months regarding his anxiety and panic attacks.  The patient reports he continues to have health issues that worry him.  He still has bradycardia.  He is on metoprolol  although a lower dose.  He has been hospitalized in the last couple of months for chest pain but he did not have an MI.  He recently wore a Holter monitor but does not have the results back yet.  He is also worried about finances as his food stamps are about to be cut due to the government shutdown.  Overall however he states he is holding his own.  He denies severe depression or thoughts of self-harm or suicide.  The Xanax  continues to help with his anxiety and sleep.   He does feel lonely although he lives with his wife.  His kids do not come around and he does not see very many family members. Visit Diagnosis:    ICD-10-CM   1. 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 PTSD (post-traumatic stress disorder)  F43.10       Past Psychiatric History: none  Past Medical History:  Past Medical History:  Diagnosis Date   Arthritis    Asthma    CAD (coronary artery disease)    a. Cardiac cath 07/2015 showed 65% distal Cx, 20% mid-distal LAD, 20% prox-distal RCA, EF 60%, EDP .   Colitis 1990   COPD (chronic obstructive pulmonary disease) (HCC)    Depression    Dysrhythmia    Essential hypertension    Fatty liver    Gastric ulcer 2003; 2012   2003: + esophagitis; negative H.pylori serology  2012: Dr. Harvey, mild gastritis, Bravo PH probe placement, negative H.pylori   GERD (gastroesophageal reflux disease)    Hepatic steatosis    History of hiatal hernia    Hyperlipemia    Overweight    Panic attacks    Paroxysmal atrial fibrillation (HCC)    Pulmonary nodules    Stroke (HCC)    TIA (transient ischemic attack)    Type II diabetes mellitus (HCC)     Past Surgical History:  Procedure Laterality Date   BALLOON DILATION N/A 07/25/2021  Procedure: BALLOON DILATION;  Surgeon: Cindie Carlin POUR, DO;  [facility];  Service: Endoscopy;  Laterality: N/A;   BIOPSY  07/25/2021   Procedure: BIOPSY;  Surgeon: Cindie Carlin POUR, DO;  [facility];  Service: Endoscopy;;   BIOPSY  12/04/2022   Procedure: BIOPSY;  Surgeon: Cindie Carlin POUR, DO;  [facility];  Service: Endoscopy;;   BRAVO PH STUDY  05/03/2011   DOQ:Fpoi gastritis/normal esophagus and duodenum   CARDIAC CATHETERIZATION  1990s X 1; 2005; 08/12/2015   CARDIAC CATHETERIZATION N/A 08/12/2015   Procedure: Left Heart Cath and Coronary Angiography;  Surgeon: Victory LELON Sharps, MD; LAD 20%, CFX 65%, RCA 20%, EF 60%    COLONOSCOPY  1990    COLONOSCOPY WITH PROPOFOL  N/A 11/21/2016   Dr. Harvey: non-thrombosed external hemorrhoids, one 6 mm polyp (polypoid lesion), internal hemorrhoids. TI Normal. 10 years screening   ESOPHAGOGASTRODUODENOSCOPY  05/03/2011   DOQ:fpoi gastritis   ESOPHAGOGASTRODUODENOSCOPY (EGD) WITH PROPOFOL  N/A 07/25/2021   Procedure: ESOPHAGOGASTRODUODENOSCOPY (EGD) WITH PROPOFOL ;  Surgeon: Cindie Carlin POUR, DO;  [facility];  Service: Endoscopy;  Laterality: N/A;  1:30pm   ESOPHAGOGASTRODUODENOSCOPY (EGD) WITH PROPOFOL  N/A 12/04/2022   Procedure: ESOPHAGOGASTRODUODENOSCOPY (EGD) WITH PROPOFOL ;  Surgeon: Cindie Carlin POUR, DO;  [facility];  Service: Endoscopy;  Laterality: N/A;  8:00AM;ASA 3   NECK MASS EXCISION Right    done in dr's office; behind right ear/side of ncek   POLYPECTOMY  11/21/2016   Procedure: POLYPECTOMY;  Surgeon: Harvey Margo CROME, MD;  [facility];  Service: Endoscopy;;  descending colon polyp   SHOULDER ARTHROSCOPY W/ ROTATOR CUFF REPAIR Right 2006   acromioclavicular joint arthrosis    Family Psychiatric History: See below  Family History:  Family History  Problem Relation Age of Onset   Lung cancer Mother    Alcohol abuse Mother    Heart attack Father 23   Diabetes Father    Alcohol abuse Father    Asthma Sister    Anxiety disorder Sister    Depression Sister    Anxiety disorder Sister    Hypertension Brother    Hypertension Brother    Heart attack Brother 71   Diabetes Brother    Hypertension Brother    Seizures Brother    Dementia Paternal Uncle    ADD / ADHD Daughter    Dementia Cousin    Colon cancer Neg Hx    Drug abuse Neg Hx    Bipolar disorder Neg Hx    OCD Neg Hx    Paranoid behavior Neg Hx    Schizophrenia Neg Hx    Sexual abuse Neg Hx    Physical abuse Neg Hx     Social History:  Social History   Socioeconomic History   Marital status: Married    Spouse name: Not on file   Number of children: Not on file   Years of  education: Not on file   Highest education level: Not on file  Occupational History   Occupation: full time    Employer: UNEMPLOYED  Tobacco Use   Smoking status: Every Day    Current packs/day: 0.50    Average packs/day: 1 pack/day for 41.8 years (40.2 ttl pk-yrs)    Types: Cigarettes    Start date: 07/17/1982   Smokeless tobacco: Never   Tobacco comments:    1/2 pack a day 05/13/2024 KRD    1 pack lasts pt 3 days   Vaping Use  Vaping status: Never Used  Substance and Sexual Activity   Alcohol use: Not Currently   Drug use: No   Sexual activity: Yes    Birth control/protection: None  Other Topics Concern   Not on file  Social History Narrative   Pt lives in [place] with wife.  5 children.  Unemployed due to panic attacks and back pain   Social Drivers of Health   Financial Resource Strain: Not on file  Food Insecurity: No Food Insecurity (04/15/2024)   Hunger Vital Sign    Worried About Running Out of Food in the Last Year: Never true    Ran Out of Food in the Last Year: Never true  Transportation Needs: No Transportation Needs (04/15/2024)   PRAPARE - Administrator, Civil Service (Medical): No    Lack of Transportation (Non-Medical): No  Physical Activity: Not on file  Stress: Not on file  Social Connections: Not on file    Allergies:  Allergies  Allergen Reactions   Alpha-Gal Anaphylaxis   Dexilant  [Dexlansoprazole ] Anaphylaxis   Mushroom Ext Cmplx(Shiitake-Reishi-Mait) Anaphylaxis    Rapid heart rate.   Penicillins Anaphylaxis    Immediate rash, facial/tongue/throat swelling, SOB or lightheadedness with hypotension   Doxycycline Nausea And Vomiting         Metabolic Disorder Labs: Lab Results  Component Value Date   HGBA1C 5.8 (H) 01/24/2024   MPG 111.15 02/16/2022   MPG 114.02 11/09/2021   No results found for: PROLACTIN Lab Results  Component Value Date   CHOL 119 01/24/2024   TRIG 90 01/24/2024   HDL 39 (L) 01/24/2024    CHOLHDL 3.1 01/24/2024   VLDL 8 06/13/2022   LDLCALC 63 01/24/2024   LDLCALC 60 09/20/2023   Lab Results  Component Value Date   TSH 0.747 04/14/2024   TSH 1.670 01/24/2024    Therapeutic Level Labs: No results found for: LITHIUM No results found for: VALPROATE No results found for: CBMZ  Current Medications: Current Outpatient Medications  Medication Sig Dispense Refill   acetaminophen  (TYLENOL ) 325 MG tablet Take 650 mg by mouth every 6 (six) hours as needed for moderate pain (pain score 4-6).     albuterol  (VENTOLIN  HFA) 108 (90 Base) MCG/ACT inhaler INHALE 2 PUFFS BY MOUTH EVERY 6 HOURS AS NEEDED FOR COUGHING, WHEEZING, OR SHORTNESS OF BREATH (Patient taking differently: Inhale 2 puffs into the lungs every 6 (six) hours as needed for wheezing or shortness of breath (coughing). INHALE 2 PUFFS BY MOUTH EVERY 6 HOURS AS NEEDED FOR COUGHING, WHEEZING, OR SHORTNESS OF BREATH) 20.1 g 0   ALPRAZolam  (XANAX ) 0.5 MG tablet Take one twice a day and two at bedtime 120 tablet 2   amLODipine  (NORVASC ) 10 MG tablet Take 1 tablet (10 mg total) by mouth daily. 90 tablet 1   azithromycin (ZITHROMAX) 250 MG tablet Take 2 tablets today, the one tablet daily x 4 days 6 tablet 0   budesonide -formoterol  (SYMBICORT ) 160-4.5 MCG/ACT inhaler Take 2 puffs first thing in am and then another 2 puffs about 12 hours later. 1 each 12   EPINEPHrine  0.3 mg/0.3 mL IJ SOAJ injection Inject 0.3 mg into the muscle as needed for anaphylaxis. 2 each 1   esomeprazole  (NEXIUM ) 40 MG capsule Take 1 capsule (40 mg total) by mouth 2 (two) times daily before a meal. 180 capsule 3   lidocaine  (XYLOCAINE ) 2 % solution Use as directed 15 mLs in the mouth or throat every 3 (three) hours as needed for  mouth pain. 100 mL 0   linaclotide  (LINZESS ) 72 MCG capsule Take 1 capsule (72 mcg total) by mouth daily before breakfast for 12 days.     metoprolol  tartrate (LOPRESSOR ) 25 MG tablet Take 12.5 mg by mouth 2 (two) times daily.      nitroGLYCERIN  (NITROSTAT ) 0.4 MG SL tablet Place 1 tablet (0.4 mg total) under the tongue every 5 (five) minutes x 3 doses as needed for chest pain (if no relief after 3rd dose, proceed to the ED for an evaluation). 25 tablet 3   potassium chloride  SA (KLOR-CON  M20) 20 MEQ tablet Take 1 tablet (20 mEq total) by mouth daily. 90 tablet 3   predniSONE  (DELTASONE ) 10 MG tablet Prednisone  taper; 10 mg tablets: 4 tabs x 2 days, 3 tabs x 2 days, 2 tabs x 2 days 1 tab x 2 days then stop. 20 tablet 0   rivaroxaban  (XARELTO ) 20 MG TABS tablet TAKE 1 TABLET BY MOUTH ONCE DAILY WITH SUPPER 90 tablet 1   simvastatin  (ZOCOR ) 20 MG tablet Take 1 tablet by mouth once daily 90 tablet 3   Spacer/Aero-Holding Chambers (AEROCHAMBER PLUS) Device To be used as directed with inhaler for better technique and greater benefit 1 each 0   traMADol (ULTRAM) 50 MG tablet Take 1 tablet (50 mg total) by mouth every 12 (twelve) hours as needed. 6 tablet 0   vitamin B-12 (CYANOCOBALAMIN ) 50 MCG tablet Take 50 mcg by mouth daily.     VITAMIN D  PO Take 1 tablet by mouth daily.     No current facility-administered medications for this visit.     Musculoskeletal: Strength & Muscle Tone: within normal limits Gait & Station: normal Patient leans: N/A  Psychiatric Specialty Exam: Review of Systems  Respiratory:  Positive for chest tightness and shortness of breath.   Cardiovascular:  Positive for palpitations.  Neurological:  Positive for weakness.  All other systems reviewed and are negative.   There were no vitals taken for this visit.There is no height or weight on file to calculate BMI.  General Appearance: Casual and Fairly Groomed  Eye Contact:  Good  Speech:  Clear and Coherent  Volume:  Normal  Mood:  Anxious  Affect:  Flat  Thought Process:  Goal Directed  Orientation:  Full (Time, Place, and Person)  Thought Content: Rumination   Suicidal Thoughts:  No  Homicidal Thoughts:  No  Memory:  Immediate;    Good Recent;   Good Remote;   NA  Judgement:  Good  Insight:  Good  Psychomotor Activity:  Decreased  Concentration:  Concentration: Good and Attention Span: Good  Recall:  Good  Fund of Knowledge: Good  Language: Good  Akathisia:  No  Handed:  Right  AIMS (if indicated): not done  Assets:  Communication Skills Desire for Improvement Resilience Social Support  ADL's:  Intact  Cognition: WNL  Sleep:  Good   Screenings: GAD-7    Flowsheet Row Office Visit from 01/21/2024 in [facility] Office Visit from 01/01/2024 in [facility] Office Visit from 11/20/2023 in [facility] Office Visit from 08/14/2023 in [facility] Office Visit from 07/03/2023 in [facility]  Total GAD-7 Score 8 8 6  0 8   PHQ2-9    Flowsheet Row Office Visit from 01/21/2024 in [facility] Office Visit from 01/01/2024 in [facility] Office Visit from 11/20/2023 in [facility] Office Visit from 08/14/2023 in [facility] Office Visit from 07/03/2023 in [facility]  PHQ-2 Total Score 3 3 3  0 2  PHQ-9 Total Score 8 8 7  0 9   Flowsheet Row ED from 05/04/2024 in [facility] Emergency Department at [facility] UC from 04/22/2024 in [facility] at [facility] ED to Hosp-Admission (Discharged) from 04/14/2024 in [facility] MEDICAL SURGICAL UNIT  C-SSRS RISK CATEGORY No Risk No Risk No Risk     Assessment and Plan:  This patient is a 58 year old male with a history of anxiety panic attacks and agoraphobia.  He still feels that Xanax  is helping with the symptoms so we will continue Xanax  0.5 mg twice daily for anxiety and 1 mg at bedtime for anxiety and sleep.  He will return to see me in 3 months    Collaboration of Care: Collaboration of Care: Referral or follow-up with  counselor/therapist AEB patient will continue therapy with Jerel Pepper in our office  Patient/Guardian was advised Release of Information must be obtained prior to any record release in order to collaborate their care with an outside provider. Patient/Guardian was advised if they have not already done so to contact the registration department to sign all necessary forms in order for us  to release information regarding their care.   Consent: Patient/Guardian gives verbal consent for treatment and assignment of benefits for services provided during this visit. Patient/Guardian expressed understanding and agreed to proceed.    Barnie Gull, MD 05/16/2024, 11:54 AM

## 2024-05-17 ENCOUNTER — Ambulatory Visit: Payer: Self-pay | Admitting: Gastroenterology

## 2024-05-19 ENCOUNTER — Encounter: Payer: Self-pay | Admitting: Radiology

## 2024-05-21 ENCOUNTER — Other Ambulatory Visit: Payer: Self-pay

## 2024-05-21 ENCOUNTER — Encounter (HOSPITAL_COMMUNITY): Payer: Self-pay | Admitting: Emergency Medicine

## 2024-05-21 ENCOUNTER — Emergency Department (HOSPITAL_COMMUNITY)

## 2024-05-21 ENCOUNTER — Emergency Department (HOSPITAL_COMMUNITY)
Admission: EM | Admit: 2024-05-21 | Discharge: 2024-05-21 | Disposition: A | Discharge status: Home / Self Care | Attending: Emergency Medicine | Admitting: Emergency Medicine

## 2024-05-21 DIAGNOSIS — R0789 Other chest pain: Secondary | ICD-10-CM | POA: Diagnosis not present

## 2024-05-21 DIAGNOSIS — R002 Palpitations: Secondary | ICD-10-CM | POA: Diagnosis not present

## 2024-05-21 DIAGNOSIS — R079 Chest pain, unspecified: Secondary | ICD-10-CM | POA: Diagnosis not present

## 2024-05-21 DIAGNOSIS — I2510 Atherosclerotic heart disease of native coronary artery without angina pectoris: Secondary | ICD-10-CM | POA: Diagnosis not present

## 2024-05-21 DIAGNOSIS — F17210 Nicotine dependence, cigarettes, uncomplicated: Secondary | ICD-10-CM | POA: Insufficient documentation

## 2024-05-21 DIAGNOSIS — E119 Type 2 diabetes mellitus without complications: Secondary | ICD-10-CM | POA: Diagnosis not present

## 2024-05-21 DIAGNOSIS — Z8673 Personal history of transient ischemic attack (TIA), and cerebral infarction without residual deficits: Secondary | ICD-10-CM | POA: Diagnosis not present

## 2024-05-21 DIAGNOSIS — I499 Cardiac arrhythmia, unspecified: Secondary | ICD-10-CM | POA: Diagnosis not present

## 2024-05-21 DIAGNOSIS — I10 Essential (primary) hypertension: Secondary | ICD-10-CM | POA: Diagnosis not present

## 2024-05-21 DIAGNOSIS — J449 Chronic obstructive pulmonary disease, unspecified: Secondary | ICD-10-CM | POA: Insufficient documentation

## 2024-05-21 LAB — CBC
HCT: 41.6 % (ref 39.0–52.0)
Hemoglobin: 14.8 g/dL (ref 13.0–17.0)
MCH: 32.5 pg (ref 26.0–34.0)
MCHC: 35.6 g/dL (ref 30.0–36.0)
MCV: 91.2 fL (ref 80.0–100.0)
Platelets: 173 K/uL (ref 150–400)
RBC: 4.56 MIL/uL (ref 4.22–5.81)
RDW: 12.2 % (ref 11.5–15.5)
WBC: 10.3 K/uL (ref 4.0–10.5)
nRBC: 0 % (ref 0.0–0.2)

## 2024-05-21 LAB — BASIC METABOLIC PANEL WITH GFR
Anion gap: 13 (ref 5–15)
BUN: 13 mg/dL (ref 6–20)
CO2: 23 mmol/L (ref 22–32)
Calcium: 9.8 mg/dL (ref 8.9–10.3)
Chloride: 103 mmol/L (ref 98–111)
Creatinine, Ser: 0.76 mg/dL (ref 0.61–1.24)
GFR, Estimated: >60 mL/min (ref >60)
Glucose, Bld: 184 mg/dL — ABNORMAL HIGH (ref 70–99)
Potassium: 3.8 mmol/L (ref 3.5–5.1)
Sodium: 139 mmol/L (ref 135–145)

## 2024-05-21 LAB — MAGNESIUM: Magnesium: 1.9 mg/dL (ref 1.7–2.4)

## 2024-05-21 LAB — TROPONIN T, HIGH SENSITIVITY
Troponin T High Sensitivity: <15 ng/L (ref 0–19)
Troponin T High Sensitivity: <15 ng/L (ref 0–19)

## 2024-05-21 MED ORDER — POTASSIUM CHLORIDE 20 MEQ PO PACK
40.0000 meq | PACK | Freq: Once | ORAL | Status: AC
  Administered 2024-05-21: 40 meq via ORAL
  Filled 2024-05-21: qty 2

## 2024-05-21 NOTE — ED Triage Notes (Addendum)
 Pt BIB [facility] from home pt c/o chest pain and palpitations that woke him up. Pt states he was having palpitations all day yesterday. Hx of Afib, pt NSR with EMS.   Pt states he also started taking oral steroids yesterday.  Pt took 4 baby ASA and 0.5 xanax  prior to EMS arrival.

## 2024-05-21 NOTE — Discharge Instructions (Signed)
 You were evaluated in the Emergency Department and after careful evaluation, we did not find any emergent condition requiring admission or further testing in the hospital.  Your exam/testing today is overall reassuring.  Suspect your symptoms are related to your A-fib.  Recommend close follow-up with your primary care doctor and/or cardiologist.  Please return to the Emergency Department if you experience any worsening of your condition.   Thank you for allowing us  to be a part of your care.

## 2024-05-21 NOTE — ED Provider Notes (Signed)
 [facility]-EMERGENCY DE[facility] Emergency Department Provider Note MRN:  [id number]  Arrival date & time: 05/21/24     Chief Complaint   Chest Pain   History of Present Illness   Richard [name] is a 58 y.o. year-old male with a history of CAD, COPD presenting to the ED with chief complaint of chest pain.  Occasional palpitations yesterday.  This evening woke up with significant palpitations, folic his heart was racing, felt consistent with A-fib with RVR.  Almost passed out while walking to the bathroom.  Laid down to try to relax and lower his heart rate.  Would not calm down for a while but then did.  Still having some chest pain radiating to the left neck despite the heart rate going back to normal.  Review of Systems  A thorough review of systems was obtained and all systems are negative except as noted in the HPI and PMH.   Patient's Health History    Past Medical History:  Diagnosis Date   Arthritis    Asthma    CAD (coronary artery disease)    a. Cardiac cath 07/2015 showed 65% distal Cx, 20% mid-distal LAD, 20% prox-distal RCA, EF 60%, EDP .   Colitis 1990   COPD (chronic obstructive pulmonary disease) (HCC)    Depression    Dysrhythmia    Essential hypertension    Fatty liver    Gastric ulcer 2003; 2012   2003: + esophagitis; negative H.pylori serology  2012: Dr. Harvey, mild gastritis, Bravo PH probe placement, negative H.pylori   GERD (gastroesophageal reflux disease)    Hepatic steatosis    History of hiatal hernia    Hyperlipemia    Overweight    Panic attacks    Paroxysmal atrial fibrillation (HCC)    Pulmonary nodules    Stroke (HCC)    TIA (transient ischemic attack)    Type II diabetes mellitus (HCC)     Past Surgical History:  Procedure Laterality Date   BALLOON DILATION N/A 07/25/2021   Procedure: BALLOON DILATION;  Surgeon: Cindie Carlin POUR, DO;  Location: [facility];  Service: Endoscopy;  Laterality: N/A;   BIOPSY  07/25/2021    Procedure: BIOPSY;  Surgeon: Cindie Carlin POUR, DO;  Location: [facility];  Service: Endoscopy;;   BIOPSY  12/04/2022   Procedure: BIOPSY;  Surgeon: Cindie Carlin POUR, DO;  Location: [facility];  Service: Endoscopy;;   BRAVO PH STUDY  05/03/2011   DOQ:Fpoi gastritis/normal esophagus and duodenum   CARDIAC CATHETERIZATION  1990s X 1; 2005; 08/12/2015   CARDIAC CATHETERIZATION N/A 08/12/2015   Procedure: Left Heart Cath and Coronary Angiography;  Surgeon: Victory LELON Sharps, MD; LAD 20%, CFX 65%, RCA 20%, EF 60%    COLONOSCOPY  1990   COLONOSCOPY WITH PROPOFOL  N/A 11/21/2016   Dr. Harvey: non-thrombosed external hemorrhoids, one 6 mm polyp (polypoid lesion), internal hemorrhoids. TI Normal. 10 years screening   ESOPHAGOGASTRODUODENOSCOPY  05/03/2011   DOQ:fpoi gastritis   ESOPHAGOGASTRODUODENOSCOPY (EGD) WITH PROPOFOL  N/A 07/25/2021   Procedure: ESOPHAGOGASTRODUODENOSCOPY (EGD) WITH PROPOFOL ;  Surgeon: Cindie Carlin POUR, DO;  Location: [facility];  Service: Endoscopy;  Laterality: N/A;  1:30pm   ESOPHAGOGASTRODUODENOSCOPY (EGD) WITH PROPOFOL  N/A 12/04/2022   Procedure: ESOPHAGOGASTRODUODENOSCOPY (EGD) WITH PROPOFOL ;  Surgeon: Cindie Carlin POUR, DO;  Location: [facility];  Service: Endoscopy;  Laterality: N/A;  8:00AM;ASA 3   NECK MASS EXCISION Right    done in dr's office; behind right ear/side of ncek   POLYPECTOMY  11/21/2016   Procedure: POLYPECTOMY;  Surgeon: Harvey Margo CROME, MD;  Location: [facility];  Service: Endoscopy;;  descending colon polyp   SHOULDER ARTHROSCOPY W/ ROTATOR CUFF REPAIR Right 2006   acromioclavicular joint arthrosis    Family History  Problem Relation Age of Onset   Lung cancer Mother    Alcohol abuse Mother    Heart attack Father 26   Diabetes Father    Alcohol abuse Father    Asthma Sister    Anxiety disorder Sister    Depression Sister    Anxiety disorder Sister    Hypertension Brother    Hypertension Brother    Heart attack Brother 30   Diabetes  Brother    Hypertension Brother    Seizures Brother    Dementia Paternal Uncle    ADD / ADHD Daughter    Dementia Cousin    Colon cancer Neg Hx    Drug abuse Neg Hx    Bipolar disorder Neg Hx    OCD Neg Hx    Paranoid behavior Neg Hx    Schizophrenia Neg Hx    Sexual abuse Neg Hx    Physical abuse Neg Hx     Social History   Socioeconomic History   Marital status: Married    Spouse name: Not on file   Number of children: Not on file   Years of education: Not on file   Highest education level: Not on file  Occupational History   Occupation: full time    Employer: UNEMPLOYED  Tobacco Use   Smoking status: Every Day    Current packs/day: 0.50    Average packs/day: 1 pack/day for 41.8 years (40.2 ttl pk-yrs)    Types: Cigarettes    Start date: 07/17/1982   Smokeless tobacco: Never   Tobacco comments:    1/2 pack a day 05/13/2024 KRD    1 pack lasts pt 3 days   Vaping Use   Vaping status: Never Used  Substance and Sexual Activity   Alcohol use: Not Currently   Drug use: No   Sexual activity: Yes    Birth control/protection: None  Other Topics Concern   Not on file  Social History Narrative   Pt lives in [place] with wife.  5 children.  Unemployed due to panic attacks and back pain   Social Drivers of Health   Financial Resource Strain: Not on file  Food Insecurity: No Food Insecurity (04/15/2024)   Hunger Vital Sign    Worried About Running Out of Food in the Last Year: Never true    Ran Out of Food in the Last Year: Never true  Transportation Needs: No Transportation Needs (04/15/2024)   PRAPARE - Administrator, Civil Service (Medical): No    Lack of Transportation (Non-Medical): No  Physical Activity: Not on file  Stress: Not on file  Social Connections: Not on file  Intimate Partner Violence: Not At Risk (04/15/2024)   Humiliation, Afraid, Rape, and Kick questionnaire    Fear of Current or Ex-Partner: No    Emotionally Abused: No     Physically Abused: No    Sexually Abused: No     Physical Exam   Vitals:   05/21/24 0600 05/21/24 0700  BP: 128/68 124/71  Pulse: (!) 51 (!) 52  Resp: 14 13  Temp:    SpO2: 98% 96%    CONSTITUTIONAL: Well-appearing, NAD NEURO/PSYCH:  Alert and oriented x 3, no focal deficits EYES:  eyes equal  and reactive ENT/NECK:  no LAD, no JVD CARDIO: Regular rate, well-perfused, normal S1 and S2 PULM:  CTAB no wheezing or rhonchi GI/GU:  non-distended, non-tender MSK/SPINE:  No gross deformities, no edema SKIN:  no rash, atraumatic   *Additional and/or pertinent findings included in MDM below  Diagnostic and Interventional Summary    EKG Interpretation Date/Time:    Ventricular Rate:    PR Interval:    QRS Duration:    QT Interval:    QTC Calculation:   R Axis:      Text Interpretation:         Labs Reviewed  BASIC METABOLIC PANEL WITH GFR - Abnormal; Notable for the following components:      Result Value   Glucose, Bld 184 (*)    All other components within normal limits  CBC  MAGNESIUM   TROPONIN T, HIGH SENSITIVITY  TROPONIN T, HIGH SENSITIVITY    DG Chest 2 View  Final Result      Medications  potassium chloride  (KLOR-CON ) packet 40 mEq (40 mEq Oral Given 05/21/24 9361)     Procedures  /  Critical Care Procedures  ED Course and Medical Decision Making  Initial Impression and Ddx Differential diagnosis includes ACS, A-fib with RVR, other tachyarrhythmia, electrolyte disturbance, side effect of recent steroids.  Past medical/surgical history that increases complexity of ED encounter: CAD, A-fib, COPD  Interpretation of Diagnostics I personally reviewed the EKG and my interpretation is as follows: Sinus rhythm without ischemic findings  No significant blood count or electrolyte disturbance.  Troponin negative x 2  Patient Reassessment and Ultimate Disposition/Management     Patient doing well, observed for 3 hours for the emergency department with no  arrhythmia or ectopy, resolution of all symptoms.  Suspect he was symptomatic from an episode of A-fib with RVR.  Appropriate for discharge with follow-up.  Patient management required discussion with the following services or consulting groups:  None  Complexity of Problems Addressed Acute illness or injury that poses threat of life of bodily function  Additional Data Reviewed and Analyzed Further history obtained from: Further history from spouse/family member  Additional Factors Impacting ED Encounter Risk Consideration of hospitalization  Ozell HERO. Theadore, MD [facility] Emergency Medicine [facility] [email or web address]  Final Clinical Impressions(s) / ED Diagnoses     ICD-10-CM   1. Chest pain, unspecified type  R07.9       ED Discharge Orders     None        Discharge Instructions Discussed with and Provided to Patient:     Discharge Instructions      You were evaluated in the Emergency Department and after careful evaluation, we did not find any emergent condition requiring admission or further testing in the hospital.  Your exam/testing today is overall reassuring.  Suspect your symptoms are related to your A-fib.  Recommend close follow-up with your primary care doctor and/or cardiologist.  Please return to the Emergency Department if you experience any worsening of your condition.   Thank you for allowing us  to be a part of your care.       Theadore Ozell HERO, MD 05/21/24 [phone]

## 2024-05-23 ENCOUNTER — Encounter: Payer: Self-pay | Admitting: Family Medicine

## 2024-05-23 ENCOUNTER — Ambulatory Visit: Admitting: Family Medicine

## 2024-05-23 VITALS — BP 126/77 | HR 62 | Ht 72.0 in | Wt 164.0 lb

## 2024-05-23 DIAGNOSIS — L309 Dermatitis, unspecified: Secondary | ICD-10-CM | POA: Diagnosis not present

## 2024-05-23 DIAGNOSIS — E038 Other specified hypothyroidism: Secondary | ICD-10-CM | POA: Diagnosis not present

## 2024-05-23 NOTE — Patient Instructions (Addendum)
 I appreciate the opportunity to provide care to you today!    Follow up:  4 months  Labs: please stop by the lab today to get your blood drawn ( TSH)  For a Healthier YOU, I Recommend: Reducing your intake of sugar, sodium, carbohydrates, and saturated fats. Increasing your fiber intake by incorporating more whole grains, fruits, and vegetables into your meals. Setting healthy goals with a focus on lowering your consumption of carbs, sugar, and unhealthy fats. Adding variety to your diet by including a wide range of fruits and vegetables. Cutting back on soda and limiting processed foods as much as possible. Staying active: In addition to taking your weight loss medication, aim for at least 150 minutes of moderate-intensity physical activity each week for optimal results.    Please follow up if your symptoms worsen or fail to improve.   Referrals today-  dermatology    Please continue to a heart-healthy diet and increase your physical activities. Try to exercise for at least five days a week.    It was a pleasure to see you and I look forward to continuing to work together on your health and well-being. Please do not hesitate to call the office if you need care or have questions about your care.  In case of emergency, please visit the Emergency Department for urgent care, or contact our clinic at [phone] to schedule an appointment. We're here to help you!   Have a wonderful day and week. With Gratitude, Meade JENEANE Gerlach MSN, FNP-BC, PMHNP-BC

## 2024-05-23 NOTE — Progress Notes (Signed)
 Established Patient Office Visit  Subjective:  Patient ID: Richard [name], male    DOB: 1966-05-10  Age: 58 y.o. MRN: [id number]  CC:  Chief Complaint  Patient presents with   Hypertension    Four month follow up    Referral    Referral to dermatology to [facility] [address]    Labs Only    Requesting potassium, vitamins level, metabolism     HPI Richard [name] is a 58 y.o. male with past medical history of HTN, GERD, T2DM presents for f/u of  chronic medical conditions.  For the details of today's visit, please refer to the assessment and plan.    The patient reports a red spot on the back that has been present for over a year. The area flares up intermittently, with periods of improvement and recurrence. It initially appeared as a small red spot but has gradually increased in size over time. Denies associated pain, itching, bleeding, or drainage.  Past Medical History:  Diagnosis Date   Arthritis    Asthma    CAD (coronary artery disease)    a. Cardiac cath 07/2015 showed 65% distal Cx, 20% mid-distal LAD, 20% prox-distal RCA, EF 60%, EDP .   Colitis 1990   COPD (chronic obstructive pulmonary disease) (HCC)    Depression    Dysrhythmia    Essential hypertension    Fatty liver    Gastric ulcer 2003; 2012   2003: + esophagitis; negative H.pylori serology  2012: Dr. Harvey, mild gastritis, Bravo PH probe placement, negative H.pylori   GERD (gastroesophageal reflux disease)    Hepatic steatosis    History of hiatal hernia    Hyperlipemia    Overweight    Panic attacks    Paroxysmal atrial fibrillation (HCC)    Pulmonary nodules    Stroke (HCC)    TIA (transient ischemic attack)    Type II diabetes mellitus (HCC)     Past Surgical History:  Procedure Laterality Date   BALLOON DILATION N/A 07/25/2021   Procedure: BALLOON DILATION;  Surgeon: Cindie Carlin POUR, DO;  [facility];  Service: Endoscopy;  Laterality: N/A;   BIOPSY  07/25/2021    Procedure: BIOPSY;  Surgeon: Cindie Carlin POUR, DO;  [facility];  Service: Endoscopy;;   BIOPSY  12/04/2022   Procedure: BIOPSY;  Surgeon: Cindie Carlin POUR, DO;  [facility];  Service: Endoscopy;;   BRAVO PH STUDY  05/03/2011   DOQ:Fpoi gastritis/normal esophagus and duodenum   CARDIAC CATHETERIZATION  1990s X 1; 2005; 08/12/2015   CARDIAC CATHETERIZATION N/A 08/12/2015   Procedure: Left Heart Cath and Coronary Angiography;  Surgeon: Victory LELON Sharps, MD; LAD 20%, CFX 65%, RCA 20%, EF 60%    COLONOSCOPY  1990   COLONOSCOPY WITH PROPOFOL  N/A 11/21/2016   Dr. Harvey: non-thrombosed external hemorrhoids, one 6 mm polyp (polypoid lesion), internal hemorrhoids. TI Normal. 10 years screening   ESOPHAGOGASTRODUODENOSCOPY  05/03/2011   DOQ:fpoi gastritis   ESOPHAGOGASTRODUODENOSCOPY (EGD) WITH PROPOFOL  N/A 07/25/2021   Procedure: ESOPHAGOGASTRODUODENOSCOPY (EGD) WITH PROPOFOL ;  Surgeon: Cindie Carlin POUR, DO;  [facility];  Service: Endoscopy;  Laterality: N/A;  1:30pm   ESOPHAGOGASTRODUODENOSCOPY (EGD) WITH PROPOFOL  N/A 12/04/2022   Procedure: ESOPHAGOGASTRODUODENOSCOPY (EGD) WITH PROPOFOL ;  Surgeon: Cindie Carlin POUR, DO;  [facility];  Service: Endoscopy;  Laterality: N/A;  8:00AM;ASA 3   NECK MASS EXCISION Right    done in dr's office; behind right ear/side of ncek  POLYPECTOMY  11/21/2016   Procedure: POLYPECTOMY;  Surgeon: Harvey Margo CROME, MD;  Location: [facility];  Service: Endoscopy;;  descending colon polyp   SHOULDER ARTHROSCOPY W/ ROTATOR CUFF REPAIR Right 2006   acromioclavicular joint arthrosis    Family History  Problem Relation Age of Onset   Lung cancer Mother    Alcohol abuse Mother    Heart attack Father 75   Diabetes Father    Alcohol abuse Father    Asthma Sister    Anxiety disorder Sister    Depression Sister    Anxiety disorder Sister    Hypertension Brother    Hypertension Brother    Heart attack Brother 80   Diabetes  Brother    Hypertension Brother    Seizures Brother    Dementia Paternal Uncle    ADD / ADHD Daughter    Dementia Cousin    Colon cancer Neg Hx    Drug abuse Neg Hx    Bipolar disorder Neg Hx    OCD Neg Hx    Paranoid behavior Neg Hx    Schizophrenia Neg Hx    Sexual abuse Neg Hx    Physical abuse Neg Hx     Social History   Socioeconomic History   Marital status: Married    Spouse name: Not on file   Number of children: Not on file   Years of education: Not on file   Highest education level: Not on file  Occupational History   Occupation: full time    Employer: UNEMPLOYED  Tobacco Use   Smoking status: Every Day    Current packs/day: 0.50    Average packs/day: 1 pack/day for 41.9 years (40.2 ttl pk-yrs)    Types: Cigarettes    Start date: 07/17/1982   Smokeless tobacco: Never   Tobacco comments:    1/2 pack a day 05/13/2024 KRD    1 pack lasts pt 3 days   Vaping Use   Vaping status: Never Used  Substance and Sexual Activity   Alcohol use: Not Currently   Drug use: No   Sexual activity: Yes    Birth control/protection: None  Other Topics Concern   Not on file  Social History Narrative   Pt lives in [place] with wife.  5 children.  Unemployed due to panic attacks and back pain   Social Drivers of Health   Financial Resource Strain: Not on file  Food Insecurity: No Food Insecurity (04/15/2024)   Hunger Vital Sign    Worried About Running Out of Food in the Last Year: Never true    Ran Out of Food in the Last Year: Never true  Transportation Needs: No Transportation Needs (04/15/2024)   PRAPARE - Administrator, Civil Service (Medical): No    Lack of Transportation (Non-Medical): No  Physical Activity: Not on file  Stress: Not on file  Social Connections: Not on file  Intimate Partner Violence: Not At Risk (04/15/2024)   Humiliation, Afraid, Rape, and Kick questionnaire    Fear of Current or Ex-Partner: No    Emotionally Abused: No     Physically Abused: No    Sexually Abused: No    Outpatient Medications Prior to Visit  Medication Sig Dispense Refill   acetaminophen  (TYLENOL ) 325 MG tablet Take 650 mg by mouth every 6 (six) hours as needed for moderate pain (pain score 4-6).     albuterol  (VENTOLIN  HFA) 108 (90 Base) MCG/ACT inhaler INHALE 2 PUFFS BY MOUTH EVERY  6 HOURS AS NEEDED FOR COUGHING, WHEEZING, OR SHORTNESS OF BREATH (Patient taking differently: Inhale 2 puffs into the lungs every 6 (six) hours as needed for wheezing or shortness of breath (coughing). INHALE 2 PUFFS BY MOUTH EVERY 6 HOURS AS NEEDED FOR COUGHING, WHEEZING, OR SHORTNESS OF BREATH) 20.1 g 0   ALPRAZolam  (XANAX ) 0.5 MG tablet Take one twice a day and two at bedtime 120 tablet 2   amLODipine  (NORVASC ) 10 MG tablet Take 1 tablet (10 mg total) by mouth daily. 90 tablet 1   azithromycin (ZITHROMAX) 250 MG tablet Take 2 tablets today, the one tablet daily x 4 days 6 tablet 0   budesonide -formoterol  (SYMBICORT ) 160-4.5 MCG/ACT inhaler Take 2 puffs first thing in am and then another 2 puffs about 12 hours later. 1 each 12   EPINEPHrine  0.3 mg/0.3 mL IJ SOAJ injection Inject 0.3 mg into the muscle as needed for anaphylaxis. 2 each 1   esomeprazole  (NEXIUM ) 40 MG capsule Take 1 capsule (40 mg total) by mouth 2 (two) times daily before a meal. 180 capsule 3   lidocaine  (XYLOCAINE ) 2 % solution Use as directed 15 mLs in the mouth or throat every 3 (three) hours as needed for mouth pain. 100 mL 0   linaclotide  (LINZESS ) 72 MCG capsule Take 1 capsule (72 mcg total) by mouth daily before breakfast for 12 days.     metoprolol  tartrate (LOPRESSOR ) 25 MG tablet Take 12.5 mg by mouth 2 (two) times daily.     nitroGLYCERIN  (NITROSTAT ) 0.4 MG SL tablet Place 1 tablet (0.4 mg total) under the tongue every 5 (five) minutes x 3 doses as needed for chest pain (if no relief after 3rd dose, proceed to the ED for an evaluation). 25 tablet 3   potassium chloride  SA (KLOR-CON  M20) 20  MEQ tablet Take 1 tablet (20 mEq total) by mouth daily. 90 tablet 3   predniSONE  (DELTASONE ) 10 MG tablet Prednisone  taper; 10 mg tablets: 4 tabs x 2 days, 3 tabs x 2 days, 2 tabs x 2 days 1 tab x 2 days then stop. 20 tablet 0   rivaroxaban  (XARELTO ) 20 MG TABS tablet TAKE 1 TABLET BY MOUTH ONCE DAILY WITH SUPPER 90 tablet 1   simvastatin  (ZOCOR ) 20 MG tablet Take 1 tablet by mouth once daily 90 tablet 3   Spacer/Aero-Holding Chambers (AEROCHAMBER PLUS) Device To be used as directed with inhaler for better technique and greater benefit 1 each 0   traMADol (ULTRAM) 50 MG tablet Take 1 tablet (50 mg total) by mouth every 12 (twelve) hours as needed. 6 tablet 0   vitamin B-12 (CYANOCOBALAMIN ) 50 MCG tablet Take 50 mcg by mouth daily.     VITAMIN D  PO Take 1 tablet by mouth daily.     No facility-administered medications prior to visit.    Allergies  Allergen Reactions   Alpha-Gal Anaphylaxis   Dexilant  [Dexlansoprazole ] Anaphylaxis   Mushroom Ext Cmplx(Shiitake-Reishi-Mait) Anaphylaxis    Rapid heart rate.   Penicillins Anaphylaxis    Immediate rash, facial/tongue/throat swelling, SOB or lightheadedness with hypotension   Doxycycline Nausea And Vomiting         ROS Review of Systems  Constitutional:  Negative for fatigue and fever.  Eyes:  Negative for visual disturbance.  Respiratory:  Negative for chest tightness and shortness of breath.   Cardiovascular:  Negative for chest pain and palpitations.  Neurological:  Negative for dizziness and headaches.      Objective:    Physical Exam HENT:  Head: Normocephalic.     Right Ear: External ear normal.     Left Ear: External ear normal.     Nose: No congestion or rhinorrhea.     Mouth/Throat:     Mouth: Mucous membranes are moist.  Cardiovascular:     Rate and Rhythm: Regular rhythm.     Heart sounds: No murmur heard. Pulmonary:     Effort: No respiratory distress.     Breath sounds: Normal breath sounds.  Skin:     Comments: red spot on the back  Neurological:     Mental Status: He is alert.     BP 126/77   Pulse 62   Ht 6' (1.829 m)   Wt 164 lb (74.4 kg)   SpO2 97%   BMI 22.24 kg/m  Wt Readings from Last 3 Encounters:  05/23/24 164 lb (74.4 kg)  05/21/24 165 lb 2 oz (74.9 kg)  05/13/24 165 lb 3.2 oz (74.9 kg)    Lab Results  Component Value Date   TSH 1.810 05/23/2024   Lab Results  Component Value Date   WBC 10.3 05/21/2024   HGB 14.8 05/21/2024   HCT 41.6 05/21/2024   MCV 91.2 05/21/2024   PLT 173 05/21/2024   Lab Results  Component Value Date   NA 139 05/21/2024   K 3.8 05/21/2024   CO2 23 05/21/2024   GLUCOSE 184 (H) 05/21/2024   BUN 13 05/21/2024   CREATININE 0.76 05/21/2024   BILITOT 0.5 05/15/2024   ALKPHOS 52 05/15/2024   AST 11 05/15/2024   ALT 10 05/15/2024   PROT 6.4 05/15/2024   ALBUMIN 4.4 05/15/2024   CALCIUM  9.8 05/21/2024   ANIONGAP 13 05/21/2024   EGFR 100 05/15/2024   Lab Results  Component Value Date   CHOL 119 01/24/2024   Lab Results  Component Value Date   HDL 39 (L) 01/24/2024   Lab Results  Component Value Date   LDLCALC 63 01/24/2024   Lab Results  Component Value Date   TRIG 90 01/24/2024   Lab Results  Component Value Date   CHOLHDL 3.1 01/24/2024   Lab Results  Component Value Date   HGBA1C 5.8 (H) 01/24/2024      Assessment & Plan:  Dermatitis Assessment & Plan: Encouraged the patient to monitor the lesion for any changes in size, color, texture, or the development of pain, itching, or drainage. Encourage keeping the area clean and dry, and avoid scratching or applying harsh products. Recommend wearing loose, breathable clothing to minimize irritation. Consider dermatology referral if the lesion continues to enlarge, changes in appearance, or fails to improve with initial treatment. Educated patient on skin self-monitoring and to report any new or suspicious lesions promptly. Patient verbalized  understanding.   Orders: -     Ambulatory referral to Dermatology  TSH (thyroid -stimulating hormone deficiency) -     TSH + free T4   Note: This chart has been completed using Engineer, Civil (consulting) software, and while attempts have been made to ensure accuracy, certain words and phrases may not be transcribed as intended.   Follow-up: Return in about 4 months (around 09/20/2024).   Ia Leeb  Z Bacchus, FNP

## 2024-05-24 LAB — TSH+FREE T4
Free T4: 1.43 ng/dL (ref 0.82–1.77)
TSH: 1.81 u[IU]/mL (ref 0.450–4.500)

## 2024-05-26 ENCOUNTER — Other Ambulatory Visit: Payer: Self-pay | Admitting: Adult Health

## 2024-05-26 ENCOUNTER — Ambulatory Visit: Payer: Self-pay | Admitting: Family Medicine

## 2024-05-26 MED ORDER — ALBUTEROL SULFATE HFA 108 (90 BASE) MCG/ACT IN AERS: INHALATION_SPRAY | RESPIRATORY_TRACT | 0 refills | Status: AC

## 2024-05-26 NOTE — Telephone Encounter (Signed)
 Copied from CRM [id number]. Topic: Clinical - Medication Refill >> May 26, 2024 11:29 AM Corean SAUNDERS wrote: Medication: albuterol  (VENTOLIN  HFA) 108 (90 Base) MCG/ACT inhaler [[id number]]  Has the patient contacted their pharmacy? Yes, needs a new prescription  (Agent: If no, request that the patient contact the pharmacy for the refill. If patient does not wish to contact the pharmacy document the reason why and proceed with request.) (Agent: If yes, when and what did the pharmacy advise?)  This is the patient's preferred pharmacy:  [id number] - [place], [place] - [address] AT [facility] [address] [place] [address] Phone: [phone] Fax: [phone]   Is this the correct pharmacy for this prescription? Yes If no, delete pharmacy and type the correct one.   Has the prescription been filled recently? Yes  Is the patient out of the medication? Yes  Has the patient been seen for an appointment in the last year OR does the patient have an upcoming appointment? Yes  Can we respond through MyChart? No  Agent: Please be advised that Rx refills may take up to 3 business days. We ask that you follow-up with your pharmacy.

## 2024-05-26 NOTE — Assessment & Plan Note (Signed)
 Encouraged the patient to monitor the lesion for any changes in size, color, texture, or the development of pain, itching, or drainage. Encourage keeping the area clean and dry, and avoid scratching or applying harsh products. Recommend wearing loose, breathable clothing to minimize irritation. Consider dermatology referral if the lesion continues to enlarge, changes in appearance, or fails to improve with initial treatment. Educated patient on skin self-monitoring and to report any new or suspicious lesions promptly. Patient verbalized understanding.

## 2024-05-29 ENCOUNTER — Encounter: Payer: Self-pay | Admitting: Student

## 2024-05-29 ENCOUNTER — Ambulatory Visit: Attending: Student | Admitting: Student

## 2024-05-29 VITALS — BP 120/62 | HR 54 | Ht 72.0 in | Wt 164.6 lb

## 2024-05-29 DIAGNOSIS — I6523 Occlusion and stenosis of bilateral carotid arteries: Secondary | ICD-10-CM | POA: Insufficient documentation

## 2024-05-29 DIAGNOSIS — E782 Mixed hyperlipidemia: Secondary | ICD-10-CM | POA: Diagnosis not present

## 2024-05-29 DIAGNOSIS — I480 Paroxysmal atrial fibrillation: Secondary | ICD-10-CM | POA: Diagnosis not present

## 2024-05-29 DIAGNOSIS — I2510 Atherosclerotic heart disease of native coronary artery without angina pectoris: Secondary | ICD-10-CM | POA: Diagnosis not present

## 2024-05-29 DIAGNOSIS — I10 Essential (primary) hypertension: Secondary | ICD-10-CM | POA: Insufficient documentation

## 2024-05-29 MED ORDER — NITROGLYCERIN 0.4 MG SL SUBL: 0.4000 mg | SUBLINGUAL_TABLET | SUBLINGUAL | 3 refills | Status: DC | PRN

## 2024-05-29 NOTE — Progress Notes (Unsigned)
 Cardiology Office Note    Date:  05/30/2024  ID:  RAKESH DUTKO, DOB [birth year], MRN [id number] Cardiologist: Jayson Sierras, MD Electrophysiologist:  Danelle Birmingham, MD { :  History of Present Illness:    Richard [name] is a 58 y.o. male with past medical history of CAD (nonobstructive disease by cath in 07/2015, low-risk NST in 12/2020, Coronary CTA in 04/2023 showing 50-69% scattered stenoses but FFR only positive along distal vessels and medical management recommended unless refractory symptoms), paroxysmal atrial fibrillation, carotid artery disease, HTN, HLD, asthma, anxiety and depression who presents to the office today for follow-up from a recent Emergency Department visit.  He was last examined by Raphael Bring, PA in 04/2024 following a recent hospitalization for fatigue, chest pain and palpitations.  He had been noted to have severe bradycardia during admission which led to discontinuation of Multaq  and a 2-week monitor had been placed but not officially resulted at the time of his follow-up visit. It was recommended if he had recurrent atrial fibrillation or other arrhythmias, would refer back to EP for consideration of additional antiarrhythmic options and he was continued on Lopressor  12.5 mg twice daily and Xarelto  20 mg daily. He had not started Imdur  since his hospitalization but it was recommend to reconsider this if he had recurrent chest pain.  In the interim, he was evaluated in the ED the following week for chest pain, palpitations and dizziness. Troponin values were negative and EKG showed sinus bradycardia. Was discharged home given reassuring workup. He had recurrent ED evaluation on 05/21/2024 for chest pain and palpitations. He was observed for several hours while in the ED and continued to maintain sinus rhythm. Troponin values were again negative and EKG was reassuring.  His monitor also resulted in the interim and showed predominantly normal sinus rhythm with an average  heart rate of 53 bpm.  He had 1 episode of SVT lasting for 5 beats and no evidence of atrial fibrillation or flutter. Had a 1.1% PAC burden and 1.5% PVC burden.  In talking with the patient and his wife today, he reports still having frequent palpitations which occur sporadically. Typically most notable when resting and not with activity. He describes them as a skipped beat or fluttering sensation when they last for a few beats and then resolve. He has noted tachycardia as well and he will take a Xanax  with improvement in symptoms. Also reports taking extra potassium at times with improvement in symptoms as well. He does not check his vitals at home routinely as he reports feeling more anxious upon checking these. He has baseline dyspnea on exertion and is being followed by Pulmonology.  No specific orthopnea, PND or pitting edema. He does not consume caffeine or alcohol. Biggest issue at this time has been tooth pain and he is trying to find a dentist that is in-network with his insurance (list provided).   Studies Reviewed:   EKG: EKG is not ordered today.  Coronary CTA: 04/2023 Coronary Arteries: Right dominant with no anomalies   LM: Normal   LAD: 25-49% calcified proximal disease. 50-69% calcified plaque in mid and distal vessel   D1: 25-49% calcified plaque ostium 50-69% calcified plaque in mid vessel   D2: Small vessel not well seen   Circumflex: 1-24% calcified plaque ostium. 25-49% calcified plaque in mid/distal vessel prior to OM take off   OM1: Normal   OM2: Normal   RCA: 50-69% proximal and distal vessel 25-49% mid vessel   PDA: Normal  PLA: 25-49% calcified plaque   IMPRESSION: 1. Three vessel calcium  noted score 361 which is 90 th percentile for age/sex   2.  Normal ascending thoracic aorta 3.0 cm   3. CAD RADS 3 possible obstructive CAD in LAD/RCA study sent for FFR  FINDINGS: FFR CT positive in most distal RCA   RCA proximal 0.98, mid 0.81 distal 0.69    FFR CT positive in most distal LAD   LAD 0.96 proximal, 0.76 mid and 0.65 distal   FFR CT negative in LCX   LCX 0.98 proximal, 0.89 mid and 0.79 distal   IMPRESSION: FFR CT positive in most distal RCA and LAD   Event Monitor: 04/2024   Patch wear time was for 14 days and 0 hours.   Normal sinus rhythm predominantly ranging from 40 to 141 bpm with an average HR 53 bpm.   1 run of nonsustained SVT occurred lasting for 5 beats and 1 run of nonsustained SVT with aberrancy occurred lasting 4 beats.   No evidence of A-fib/flutter, ventricular arrhythmias, high-grade AV block or pauses.   1.1% PAC burden and 1.5% PVC burden.   No patient triggered events were noted.  Risk Assessment/Calculations:   CHA2DS2-VASc Score = 2  This indicates a 2.2% annual risk of stroke. The patient's score is based upon: CHF History: 0 HTN History: 1 Diabetes History: 0 Stroke History: 0 Vascular Disease History: 1 Age Score: 0 Gender Score: 0  Physical Exam:   VS:  BP 120/62 (BP Location: Left Arm, Cuff Size: Normal)   Pulse (!) 54   Ht 6' (1.829 m)   Wt 164 lb 9.6 oz (74.7 kg)   SpO2 96%   BMI 22.32 kg/m    Wt Readings from Last 3 Encounters:  05/29/24 164 lb 9.6 oz (74.7 kg)  05/23/24 164 lb (74.4 kg)  05/21/24 165 lb 2 oz (74.9 kg)     GEN: Well nourished, well developed male appearing in no acute distress NECK: No JVD; No carotid bruits CARDIAC: RRR, no murmurs, rubs, gallops RESPIRATORY:  Clear to auscultation without rales, wheezing or rhonchi  ABDOMEN: Appears non-distended. No obvious abdominal masses. EXTREMITIES: No clubbing or cyanosis. No pitting edema.  Distal pedal pulses are 2+ bilaterally.   Assessment and Plan:   1. PAF (paroxysmal atrial fibrillation) (HCC) - He was previously on Multaq  but this was discontinued during his prior admission given significant bradycardia. Recent outpatient monitor showed predominantly normal sinus rhythm with an average heart rate of  53 bpm and no evidence of recurrent atrial fibrillation or flutter.  - We reviewed today that his symptoms seem most consistent with PAC's or PVC's and that recent monitor showed no recurrent atrial fibrillation. Given his baseline heart rate in the 50's, I am unable to further titrate his daily AV nodal blocking agents. Will continue with Lopressor  12.5 mg twice daily and we reviewed that he could take an extra 12.5 mg if needed for tachycardia and heart rate is greater than 100 bpm. It is also possible that anxiety could be contributing to symptoms as palpitations improve with Xanax  as well but he does report some episodes of palpitations which do not occur in the setting of feeling stressed or anxious. - Continue Xarelto  20 mg daily for anticoagulation. Appropriate dose given calculated creatinine clearance of 110 mL/min. Recent CBC on 05/21/2024 showed his hemoglobin was stable at 14.8 and platelets at 173 K.   2. CAD in native artery - Prior Coronary CTA in 04/2023 showed  moderate coronary artery disease but FFR was only positive along the distal vessels and medical management was recommended. By the description of his symptoms, he is more symptomatic with palpitations which are likely due to PAC's and PVC's as compared to anginal symptoms. Continue Lopressor  12.5 mg twice daily and Simvastatin  20 mg daily. Not on ASA given the need for anticoagulation. If he develops recurrent chest pain which is more concerning for angina, would restart Imdur .  3. Bilateral carotid artery stenosis - Prior carotid dopplers in 2022 showed mild RICA stenosis but CTA of the neck in 07/2021 showed no evidence of stenosis.  Continue with statin therapy. Not on ASA given the need for anticoagulation.  4. Primary hypertension - BP is well-controlled at 120/62 during today's visit. Continue current medical therapy with Amlodipine  10 mg daily and Lopressor  12.5 mg twice daily.  5. Mixed hyperlipidemia - FLP in 01/2024  showed total cholesterol 119, triglycerides 90, HDL 39 and LDL 63.  Continue current medical therapy with Simvastatin  20 mg daily.   Signed, Laymon CHRISTELLA Qua, PA-C

## 2024-05-29 NOTE — Patient Instructions (Signed)
 Medication Instructions:   May take an extra 12.5 of Lopressor  for palpitations if heart rate is 100 bpm.   *If you need a refill on your cardiac medications before your next appointment, please call your pharmacy*  Lab Work: NONE   If you have labs (blood work) drawn today and your tests are completely normal, you will receive your results only by: MyChart Message (if you have MyChart) OR A paper copy in the mail If you have any lab test that is abnormal or we need to change your treatment, we will call you to review the results.  Testing/Procedures: NONE   Follow-Up: At [facility], you and your health needs are our priority.  As part of our continuing mission to provide you with exceptional heart care, our providers are all part of one team.  This team includes your primary Cardiologist (physician) and Advanced Practice Providers or APPs (Physician Assistants and Nurse Practitioners) who all work together to provide you with the care you need, when you need it.  Your next appointment:   3 month(s)  Provider:   You may see Jayson Sierras, MD or one of the following Advanced Practice Providers on your designated Care Team:   Laymon Qua, PA-C  [place], [place] Olivia Pavy, [place]     We recommend signing up for the patient portal called MyChart.  Sign up information is provided on this After Visit Summary.  MyChart is used to connect with patients for Virtual Visits (Telemedicine).  Patients are able to view lab/test results, encounter notes, upcoming appointments, etc.  Non-urgent messages can be sent to your provider as well.   To learn more about what you can do with MyChart, go to [email or web address].   Other Instructions Thank you for choosing [facility]!

## 2024-05-30 ENCOUNTER — Ambulatory Visit: Payer: Self-pay | Admitting: Internal Medicine

## 2024-05-30 ENCOUNTER — Encounter: Payer: Self-pay | Admitting: Student

## 2024-05-30 ENCOUNTER — Ambulatory Visit
Admission: RE | Admit: 2024-05-30 | Discharge: 2024-05-30 | Disposition: A | Discharge status: Home / Self Care | Payer: Self-pay | Attending: Family Medicine

## 2024-05-30 VITALS — BP 121/75 | HR 52 | Temp 97.5°F | Resp 18

## 2024-05-30 DIAGNOSIS — K047 Periapical abscess without sinus: Secondary | ICD-10-CM

## 2024-05-30 MED ORDER — LIDOCAINE VISCOUS HCL 2 % MT SOLN: 10.0000 mL | OROMUCOSAL | 0 refills | Status: AC | PRN

## 2024-05-30 MED ORDER — CHLORHEXIDINE GLUCONATE 0.12 % MT SOLN: 15.0000 mL | Freq: Two times a day (BID) | OROMUCOSAL | 0 refills | Status: AC

## 2024-05-30 MED ORDER — CLINDAMYCIN PALMITATE HCL 75 MG/5ML PO SOLR: 300.0000 mg | Freq: Two times a day (BID) | ORAL | 0 refills | Status: AC

## 2024-05-30 NOTE — ED Triage Notes (Signed)
 Pt reports he has a left side tooth problem x2 weeks   Took tylenol 

## 2024-05-30 NOTE — Discharge Instructions (Signed)
 I prescribed an antibiotic, Peridex  rinse and some lidocaine  numbing solution that you may soak on a cotton swab and set to the area as needed throughout the day for pain relief.  Follow-up with dental as soon as possible.

## 2024-06-01 NOTE — ED Provider Notes (Signed)
 [facility]    CSN: [id number] Arrival date & time: 05/30/24  1145      History   Chief Complaint Chief Complaint  Patient presents with   Dental Problem    Pain and little swelling on left back tooth - Entered by patient    HPI Richard [name] is a 58 y.o. male.   Patient presenting today with 2-week history of progressively worsening left lower dental pain, facial swelling in this area the past few days.  Denies fever, chills, bleeding, drainage, throat itching or swelling, difficulty breathing or swallowing.  Trying Tylenol  with minimal relief.    Past Medical History:  Diagnosis Date   Arthritis    Asthma    CAD (coronary artery disease)    a. Cardiac cath 07/2015 showed 65% distal Cx, 20% mid-distal LAD, 20% prox-distal RCA, EF 60%, EDP .   Colitis 1990   COPD (chronic obstructive pulmonary disease) (HCC)    Depression    Dysrhythmia    Essential hypertension    Fatty liver    Gastric ulcer 2003; 2012   2003: + esophagitis; negative H.pylori serology  2012: Dr. Harvey, mild gastritis, Bravo PH probe placement, negative H.pylori   GERD (gastroesophageal reflux disease)    Hepatic steatosis    History of hiatal hernia    Hyperlipemia    Overweight    Panic attacks    Paroxysmal atrial fibrillation (HCC)    Pulmonary nodules    Stroke [facility])    TIA (transient ischemic attack)    Type II diabetes mellitus [facility])     Patient Active Problem List   Diagnosis Date Noted   Chest pain 04/14/2024   Cough productive of purulent sputum 12/10/2023   Leg cramps 08/14/2023   Encounter for examination following treatment at hospital 08/14/2023   UTI (urinary tract infection) 07/03/2023   Prediabetes 06/19/2023   Dermatitis 06/19/2023   Fatigue 02/16/2023   Delayed wound healing 12/05/2022   Prostate cancer screening 11/10/2022   Arm pain, left 08/04/2022   Upper airway cough syndrome vs cough variant asthma 03/14/2022   Abnormal chest CT 11/16/2021    Pulmonary nodules 11/16/2021   RLQ abdominal pain 08/14/2021   Abnormal weight loss 08/14/2021   Acute ischemic stroke (HCC) 08/03/2021   Hypokalemia 08/03/2021   Hyperglycemia due to diabetes mellitus (HCC) 08/03/2021   Atrial fibrillation, chronic (HCC) 08/03/2021   Secondary hypercoagulable state 04/27/2021   Paroxysmal atrial fibrillation (HCC)    Lateral epicondylitis, left elbow 03/27/2019   Globus sensation 02/21/2018   Other cervical disc degeneration, unspecified cervical region 01/11/2018   Tendinitis of right triceps 01/11/2018   Neck pain 12/27/2017   Lateral epicondylitis, right elbow 05/10/2017   Constipation 12/24/2015   DOE (dyspnea on exertion) 11/17/2015   Coronary artery disease    Heart palpitations 08/12/2015   TIA (transient ischemic attack) 08/12/2015   Colon cancer screening 08/02/2015   Abdominal pain 12/18/2014   Encounter for screening colonoscopy 12/18/2014   Vitamin D  deficiency 08/20/2012   Arteriosclerotic cardiovascular disease (ASCVD) 04/11/2012   Dyspepsia 11/29/2011   Chronic low back pain    Dysphagia 05/09/2011   Essential hypertension    Anxiety and depression    Controlled diabetes mellitus type 2 with complications (HCC) 10/03/2010   Cigarette smoker 09/14/2010   COPD (chronic obstructive pulmonary disease) (HCC) 09/14/2010   Mixed hyperlipidemia 11/25/2009   GERD (gastroesophageal reflux disease) 04/05/2009   Hepatic steatosis 04/05/2009    Past Surgical History:  Procedure  Laterality Date   BALLOON DILATION N/A 07/25/2021   Procedure: BALLOON DILATION;  Surgeon: Cindie Carlin POUR, DO;  [facility];  Service: Endoscopy;  Laterality: N/A;   BIOPSY  07/25/2021   Procedure: BIOPSY;  Surgeon: Cindie Carlin POUR, DO;  [facility];  Service: Endoscopy;;   BIOPSY  12/04/2022   Procedure: BIOPSY;  Surgeon: Cindie Carlin POUR, DO;  [facility];  Service: Endoscopy;;   BRAVO PH STUDY  05/03/2011   DOQ:Fpoi  gastritis/normal esophagus and duodenum   CARDIAC CATHETERIZATION  1990s X 1; 2005; 08/12/2015   CARDIAC CATHETERIZATION N/A 08/12/2015   Procedure: Left Heart Cath and Coronary Angiography;  Surgeon: Victory LELON Sharps, MD; LAD 20%, CFX 65%, RCA 20%, EF 60%    COLONOSCOPY  1990   COLONOSCOPY WITH PROPOFOL  N/A 11/21/2016   Dr. Harvey: non-thrombosed external hemorrhoids, one 6 mm polyp (polypoid lesion), internal hemorrhoids. TI Normal. 10 years screening   ESOPHAGOGASTRODUODENOSCOPY  05/03/2011   DOQ:fpoi gastritis   ESOPHAGOGASTRODUODENOSCOPY (EGD) WITH PROPOFOL  N/A 07/25/2021   Procedure: ESOPHAGOGASTRODUODENOSCOPY (EGD) WITH PROPOFOL ;  Surgeon: Cindie Carlin POUR, DO;  [facility];  Service: Endoscopy;  Laterality: N/A;  1:30pm   ESOPHAGOGASTRODUODENOSCOPY (EGD) WITH PROPOFOL  N/A 12/04/2022   Procedure: ESOPHAGOGASTRODUODENOSCOPY (EGD) WITH PROPOFOL ;  Surgeon: Cindie Carlin POUR, DO;  [facility];  Service: Endoscopy;  Laterality: N/A;  8:00AM;ASA 3   NECK MASS EXCISION Right    done in dr's office; behind right ear/side of ncek   POLYPECTOMY  11/21/2016   Procedure: POLYPECTOMY;  Surgeon: Harvey Margo CROME, MD;  [facility];  Service: Endoscopy;;  descending colon polyp   SHOULDER ARTHROSCOPY W/ ROTATOR CUFF REPAIR Right 2006   acromioclavicular joint arthrosis       Home Medications    Prior to Admission medications   Medication Sig Start Date End Date Taking? Authorizing Provider  chlorhexidine  (PERIDEX ) 0.12 % solution Use as directed 15 mLs in the mouth or throat 2 (two) times daily. 05/30/24  Yes Stuart Vernell Norris, PA-C  clindamycin  (CLEOCIN ) 75 MG/5ML solution Take 20 mLs (300 mg total) by mouth 2 (two) times daily for 7 days. 05/30/24 06/06/24 Yes Stuart Vernell Norris, PA-C  lidocaine  (XYLOCAINE ) 2 % solution Use as directed 10 mLs in the mouth or throat every 3 (three) hours as needed. 05/30/24  Yes Stuart Vernell Norris, PA-C  acetaminophen   (TYLENOL ) 325 MG tablet Take 650 mg by mouth every 6 (six) hours as needed for moderate pain (pain score 4-6).    [provider]  albuterol  (VENTOLIN  HFA) 108 (90 Base) MCG/ACT inhaler INHALE 2 PUFFS BY MOUTH EVERY 6 HOURS AS NEEDED FOR COUGHING, WHEEZING, OR SHORTNESS OF BREATH 05/26/24   Ruthell Lauraine FALCON, NP  ALPRAZolam  (XANAX ) 0.5 MG tablet Take one twice a day and two at bedtime 05/16/24   Okey Barnie SAUNDERS, MD  amLODipine  (NORVASC ) 10 MG tablet Take 1 tablet (10 mg total) by mouth daily. 02/03/24   Zhao, Xika, NP  budesonide -formoterol  (SYMBICORT ) 160-4.5 MCG/ACT inhaler Take 2 puffs first thing in am and then another 2 puffs about 12 hours later. 01/14/24   Darlean Ozell NOVAK, MD  EPINEPHrine  0.3 mg/0.3 mL IJ SOAJ injection Inject 0.3 mg into the muscle as needed for anaphylaxis. 09/24/23   Tobie Arleta SQUIBB, MD  esomeprazole  (NEXIUM ) 40 MG capsule Take 1 capsule (40 mg total) by mouth 2 (two) times daily before a meal. 04/08/24   Shirlean Therisa LELON, NP  lidocaine  (XYLOCAINE ) 2 % solution Use as directed 15 mLs in the mouth or throat every 3 (three) hours as needed for mouth pain. 01/15/24   Chandra Harlene LABOR, NP  linaclotide  (LINZESS ) 72 MCG capsule Take 1 capsule (72 mcg total) by mouth daily before breakfast for 12 days. 05/12/24 05/29/24  Ezzard Sonny RAMAN, PA-C  metoprolol  tartrate (LOPRESSOR ) 25 MG tablet Take 12.5 mg by mouth 2 (two) times daily.    [provider]  nitroGLYCERIN  (NITROSTAT ) 0.4 MG SL tablet Place 1 tablet (0.4 mg total) under the tongue every 5 (five) minutes x 3 doses as needed for chest pain (if no relief after 3rd dose, proceed to the ED for an evaluation). 05/29/24   Strader, Brittany M, PA-C  potassium chloride  SA (KLOR-CON  M20) 20 MEQ tablet Take 1 tablet (20 mEq total) by mouth daily. 04/29/24   Dunn, Dayna N, PA-C  rivaroxaban  (XARELTO ) 20 MG TABS tablet TAKE 1 TABLET BY MOUTH ONCE DAILY WITH SUPPER 04/18/24   Debera Jayson MATSU, MD  simvastatin  (ZOCOR ) 20 MG tablet  Take 1 tablet by mouth once daily 06/22/23   Waddell Danelle ORN, MD  Spacer/Aero-Holding Chambers (AEROCHAMBER PLUS) Device To be used as directed with inhaler for better technique and greater benefit 05/13/24   Ruthell Lauraine FALCON, NP  traMADol (ULTRAM) 50 MG tablet Take 1 tablet (50 mg total) by mouth every 12 (twelve) hours as needed. 04/22/24   Leath-Warren, Etta PARAS, NP  vitamin B-12 (CYANOCOBALAMIN ) 50 MCG tablet Take 50 mcg by mouth daily.    [provider]  VITAMIN D  PO Take 1 tablet by mouth daily.    [provider]    Family History Family History  Problem Relation Age of Onset   Lung cancer Mother    Alcohol abuse Mother    Heart attack Father 35   Diabetes Father    Alcohol abuse Father    Asthma Sister    Anxiety disorder Sister    Depression Sister    Anxiety disorder Sister    Hypertension Brother    Hypertension Brother    Heart attack Brother 71   Diabetes Brother    Hypertension Brother    Seizures Brother    Dementia Paternal Uncle    ADD / ADHD Daughter    Dementia Cousin    Colon cancer Neg Hx    Drug abuse Neg Hx    Bipolar disorder Neg Hx    OCD Neg Hx    Paranoid behavior Neg Hx    Schizophrenia Neg Hx    Sexual abuse Neg Hx    Physical abuse Neg Hx     Social History Social History   Tobacco Use   Smoking status: Every Day    Current packs/day: 0.50    Average packs/day: 1 pack/day for 41.9 years (40.2 ttl pk-yrs)    Types: Cigarettes    Start date: 07/17/1982   Smokeless tobacco: Never   Tobacco comments:    1/2 pack a day 05/13/2024 KRD    1 pack lasts pt 3 days   Vaping Use   Vaping status: Never Used  Substance Use Topics   Alcohol use: Not Currently   Drug use: No     Allergies   Alpha-gal, Dexilant  [dexlansoprazole ], Mushroom ext cmplx(shiitake-reishi-mait), Penicillins, Clindamycin /lincomycin, and Doxycycline   Review of Systems Review of Systems Per HPI  Physical Exam Triage Vital Signs ED Triage Vitals   Encounter Vitals Group     BP 05/30/24 1152 121/75  Girls Systolic BP Percentile --      Girls Diastolic BP Percentile --      Boys Systolic BP Percentile --      Boys Diastolic BP Percentile --      Pulse Rate 05/30/24 1152 (!) 52     Resp 05/30/24 1152 18     Temp 05/30/24 1152 (!) 97.5 F (36.4 C)     Temp Source 05/30/24 1152 Oral     SpO2 05/30/24 1152 96 %     Weight --      Height --      Head Circumference --      Peak Flow --      Pain Score 05/30/24 1151 3     Pain Loc --      Pain Education --      Exclude from Growth Chart --    No data found.  Updated Vital Signs BP 121/75 (BP Location: Right Arm)   Pulse (!) 52   Temp (!) 97.5 F (36.4 C) (Oral)   Resp 18   SpO2 96%   Visual Acuity Right Eye Distance:   Left Eye Distance:   Bilateral Distance:    Right Eye Near:   Left Eye Near:    Bilateral Near:     Physical Exam Vitals and nursing note reviewed.  Constitutional:      Appearance: Normal appearance.  HENT:     Head: Atraumatic.     Mouth/Throat:     Mouth: Mucous membranes are moist.     Pharynx: Oropharynx is clear.     Comments: Gingival erythema, edema, poor dentition to the left lower molars Eyes:     Extraocular Movements: Extraocular movements intact.     Conjunctiva/sclera: Conjunctivae normal.  Cardiovascular:     Rate and Rhythm: Normal rate.  Pulmonary:     Effort: Pulmonary effort is normal.  Musculoskeletal:        General: Normal range of motion.     Cervical back: Normal range of motion and neck supple.  Skin:    General: Skin is warm and dry.  Neurological:     Mental Status: He is oriented to person, place, and time.  Psychiatric:        Mood and Affect: Mood normal.        Thought Content: Thought content normal.        Judgment: Judgment normal.      UC Treatments / Results  Labs (all labs ordered are listed, but only abnormal results are displayed) Labs Reviewed - No data to  display  EKG   Radiology No results found.  Procedures Procedures (including critical care time)  Medications Ordered in UC Medications - No data to display  Initial Impression / Assessment and Plan / UC Course  I have reviewed the triage vital signs and the nursing notes.  Pertinent labs & imaging results that were available during my care of the patient were reviewed by me and considered in my medical decision making (see chart for details).     Will cover for dental infection with clindamycin , he states he gets esophageal irritation with this medication in pill form but can tolerate liquid form if he takes his Carafate  with it so he plans to do this.  Peridex , viscous lidocaine  sent as well.  Discussed supportive care and dental follow-up.  Final Clinical Impressions(s) / UC Diagnoses   Final diagnoses:  Dental infection     Discharge Instructions  I prescribed an antibiotic, Peridex  rinse and some lidocaine  numbing solution that you may soak on a cotton swab and set to the area as needed throughout the day for pain relief.  Follow-up with dental as soon as possible.    ED Prescriptions     Medication Sig Dispense Auth. Provider   clindamycin  (CLEOCIN ) 75 MG/5ML solution Take 20 mLs (300 mg total) by mouth 2 (two) times daily for 7 days. 280 mL Stuart Vernell Norris, PA-C   chlorhexidine  (PERIDEX ) 0.12 % solution Use as directed 15 mLs in the mouth or throat 2 (two) times daily. 120 mL Stuart Vernell Norris, PA-C   lidocaine  (XYLOCAINE ) 2 % solution Use as directed 10 mLs in the mouth or throat every 3 (three) hours as needed. 100 mL Stuart Vernell Norris, [place]      PDMP not reviewed this encounter.   Stuart Vernell Me[place], [place] 06/01/24 [id number]

## 2024-06-02 ENCOUNTER — Ambulatory Visit (INDEPENDENT_AMBULATORY_CARE_PROVIDER_SITE_OTHER): Admitting: Clinical

## 2024-06-02 ENCOUNTER — Telehealth: Payer: Self-pay | Admitting: Internal Medicine

## 2024-06-02 DIAGNOSIS — F331 Major depressive disorder, recurrent, moderate: Secondary | ICD-10-CM

## 2024-06-02 DIAGNOSIS — F4310 Post-traumatic stress disorder, unspecified: Secondary | ICD-10-CM | POA: Diagnosis not present

## 2024-06-02 DIAGNOSIS — F410 Panic disorder [episodic paroxysmal anxiety] without agoraphobia: Secondary | ICD-10-CM | POA: Diagnosis not present

## 2024-06-02 DIAGNOSIS — F418 Other specified anxiety disorders: Secondary | ICD-10-CM | POA: Diagnosis not present

## 2024-06-02 NOTE — Progress Notes (Signed)
 Virtual Visit via Video Note   I connected with Richard [name] on 06/02/24 at  11:00 AM EDT by a video enabled telemedicine application and verified that I am speaking with the correct person using two identifiers.   Location: Patient: home Provider: office   I discussed the limitations of evaluation and management by telemedicine and the availability of in person appointments. The patient expressed understanding and agreed to proceed.     THERAPIST PROGRESS NOTE     Session Time: 11:00 AM-11:45 AM   Participation Level: Active   Behavioral Response: Casual and Alert,Anxious   Type of Therapy: Individual Therapy   Treatment Goals addressed: Mood and Anxiety   Interventions: CBT   Summary: Richard [name] is a 58 y.o. male who presents with panic disorder/depression with anxiety/and PTSD . The OPT therapist worked with the patient for his OPT treatment. The OPT therapist utilized Motivational Interviewing to assist in creating therapeutic repore. The patient in the session was engaged and work in collaboration giving feedback about his triggers and symptoms over the past few weeks. The patient spoke about dental pain leading to him going to the ED and being prescribed antibiotic for dental infection. The patient continues his work with Caridac specialist and made recent change with his blood pressure medication. The OPT therapist utilized Cognitive Behavioral Therapy through cognitive restructuring as well as worked with the patient on coping strategies to assist in management of symptoms as well as reviewed sleep, eating habits, and general health. The patient continues to work on acceptance of limited mobility based on health conditions and spoke about the impact of his physical health on his functioning. The OPT therapist worked with the patient on staying active and the patient spoke about with recent cooler weather getting out of the home and has been doing short walks with his wife  daily weather permitting. The patient spoke about being in distress due to the needs of the home repairs. The OPT therapist continued to urge the patient to pace his tasks and take interm breaks.The OPT therapist overviewed upcoming health appointments listed in the patients MyChart.   Suicidal/Homicidal: Nowithout intent/plan   Therapist Response:The OPT therapist worked with the patient for the patients scheduled session. The patient was engaged in his session and gave feedback in relation to triggers, symptoms, and behavior responses over the past few weeks.The patient spoke about dental pain leading to him going to the ED and being prescribed antibiotic for dental infection. The OPT therapist worked with the patient utilizing an in session Cognitive Behavioral Therapy exercise. The patient was responsive in the session and verbalized,  I am trying to work on taking breaks when I need to its hard when I get into a task and I want to keep going and finish it..The OPT therapist worked with the patient providing ongoing psychotherapy/education and coping skills review. The patient spoke about efforts to make good choices  by not pushing past his limits. The OPT therapist over-viewed with the patient upcoming appointments in MyChart including Cardiology, Pulmonology, and Medication Management. The patient is considering follow up with [facility] Department in relation to his dental pain.. The OPT therapist will continue treatment work with the patient in his next scheduled session.   Plan: Return again in 2/3 weeks.   Diagnosis:      Axis I: PTSD/Panic Disorder/ Depression with anxiety  Axis II: No diagnosis      Collaboration of Care: Overview of the patients involvement in the  Med Management program with Dr. Okey.   Patient/Guardian was advised Release of Information must be obtained prior to any record release in order to collaborate their care with an outside provider. Patient/Guardian was  advised if they have not already done so to contact the registration department to sign all necessary forms in order for us  to release information regarding their care.    Consent: Patient/Guardian gives verbal consent for treatment and assignment of benefits for services provided during this visit. Patient/Guardian expressed understanding and agreed to proceed.    I discussed the assessment and treatment plan with the patient. The patient was provided an opportunity to ask questions and all were answered. The patient agreed with the plan and demonstrated an understanding of the instructions.   The patient was advised to call back or seek an in-person evaluation if the symptoms worsen or if the condition fails to improve as anticipated.   I provided 45 minutes of non-face-to-face time during this encounter.   [name], LCSW    06/02/2024

## 2024-06-02 NOTE — Telephone Encounter (Signed)
    Primary Cardiologist: Jayson Sierras, MD  Chart reviewed as part of pre-operative protocol coverage. Simple dental extractions are considered low risk procedures per guidelines and generally do not require any specific cardiac clearance. It is also generally accepted that for simple extractions and dental cleanings, there is no need to interrupt blood thinner therapy.   SBE prophylaxis is not required for the patient.  I will route this recommendation to the requesting party via Epic fax function and remove from pre-op pool.  Please call with questions.  Josefa CHRISTELLA Beauvais, NP 06/02/2024, 2:47 PM

## 2024-06-02 NOTE — Telephone Encounter (Signed)
   Pre-operative Risk Assessment    Patient Name: Richard [name]  DOB: August 09, 1965 MRN: [id number]     Request for Surgical Clearance    Procedure:  Dental Extraction - Amount of Teeth to be Pulled:  1 2. When is this surgery scheduled? Press F2 to enter date below and place date in Reason for Visit (see directions below). :1} Date of Surgery:  Clearance 06/17/24                                 Surgeon:  Dr Claryce Socks Group or Practice Name:  [facility]  Phone number:  [phone] Fax number:  [phone]   Type of Clearance Requested:   - Medical    Type of Anesthesia:  Local    Additional requests/questions:  Does he  need antibiotics before?   SignedRojelio Kays   06/02/2024, 1:24 PM

## 2024-06-08 NOTE — Progress Notes (Unsigned)
 Established Patient Pulmonology Office Visit   Subjective:  Patient ID: Richard [name], male    DOB: October 09, 1965  MRN: [id number]  CC: No chief complaint on file.   HPI  Richard [name] is a 58 y.o. male with hx of nicotine  dependence, obstructive lung disease, pulmonary nodules who presents for initial evaluation of sleep disordered breathing.  {PULM QUESTIONNAIRES (Optional):33196}  ROS  {History (Optional):23778}  Current Outpatient Medications:    acetaminophen  (TYLENOL ) 325 MG tablet, Take 650 mg by mouth every 6 (six) hours as needed for moderate pain (pain score 4-6)., Disp: , Rfl:    albuterol  (VENTOLIN  HFA) 108 (90 Base) MCG/ACT inhaler, INHALE 2 PUFFS BY MOUTH EVERY 6 HOURS AS NEEDED FOR COUGHING, WHEEZING, OR SHORTNESS OF BREATH, Disp: 20.1 g, Rfl: 0   ALPRAZolam  (XANAX ) 0.5 MG tablet, Take one twice a day and two at bedtime, Disp: 120 tablet, Rfl: 2   amLODipine  (NORVASC ) 10 MG tablet, Take 1 tablet (10 mg total) by mouth daily., Disp: 90 tablet, Rfl: 1   budesonide -formoterol  (SYMBICORT ) 160-4.5 MCG/ACT inhaler, Take 2 puffs first thing in am and then another 2 puffs about 12 hours later., Disp: 1 each, Rfl: 12   chlorhexidine  (PERIDEX ) 0.12 % solution, Use as directed 15 mLs in the mouth or throat 2 (two) times daily., Disp: 120 mL, Rfl: 0   EPINEPHrine  0.3 mg/0.3 mL IJ SOAJ injection, Inject 0.3 mg into the muscle as needed for anaphylaxis., Disp: 2 each, Rfl: 1   esomeprazole  (NEXIUM ) 40 MG capsule, Take 1 capsule (40 mg total) by mouth 2 (two) times daily before a meal., Disp: 180 capsule, Rfl: 3   lidocaine  (XYLOCAINE ) 2 % solution, Use as directed 15 mLs in the mouth or throat every 3 (three) hours as needed for mouth pain., Disp: 100 mL, Rfl: 0   lidocaine  (XYLOCAINE ) 2 % solution, Use as directed 10 mLs in the mouth or throat every 3 (three) hours as needed., Disp: 100 mL, Rfl: 0   linaclotide  (LINZESS ) 72 MCG capsule, Take 1 capsule (72 mcg total) by mouth daily  before breakfast for 12 days., Disp: , Rfl:    metoprolol  tartrate (LOPRESSOR ) 25 MG tablet, Take 12.5 mg by mouth 2 (two) times daily., Disp: , Rfl:    nitroGLYCERIN  (NITROSTAT ) 0.4 MG SL tablet, Place 1 tablet (0.4 mg total) under the tongue every 5 (five) minutes x 3 doses as needed for chest pain (if no relief after 3rd dose, proceed to the ED for an evaluation)., Disp: 25 tablet, Rfl: 3   potassium chloride  SA (KLOR-CON  M20) 20 MEQ tablet, Take 1 tablet (20 mEq total) by mouth daily., Disp: 90 tablet, Rfl: 3   rivaroxaban  (XARELTO ) 20 MG TABS tablet, TAKE 1 TABLET BY MOUTH ONCE DAILY WITH SUPPER, Disp: 90 tablet, Rfl: 1   simvastatin  (ZOCOR ) 20 MG tablet, Take 1 tablet by mouth once daily, Disp: 90 tablet, Rfl: 3   Spacer/Aero-Holding Chambers (AEROCHAMBER PLUS) Device, To be used as directed with inhaler for better technique and greater benefit, Disp: 1 each, Rfl: 0   traMADol  (ULTRAM ) 50 MG tablet, Take 1 tablet (50 mg total) by mouth every 12 (twelve) hours as needed., Disp: 6 tablet, Rfl: 0   vitamin B-12 (CYANOCOBALAMIN ) 50 MCG tablet, Take 50 mcg by mouth daily., Disp: , Rfl:    VITAMIN D  PO, Take 1 tablet by mouth daily., Disp: , Rfl:       Objective:  There were no vitals taken for this visit. {  Pulm Vitals (Optional):32837}  Physical Exam   Diagnostic Review:  {Labs (Optional):32838}  PFTs 08/2023: borderline obstructive lung disease  CT Chest 05/09/2024 The previously noted irregular 6 mm anterior peripheral left lower lobe pulmonary nodule and 13 mm peripheral basilar left lower lobe pulmonary nodules have largely resolved or are decreased, however multiple new solid pulmonary nodules within the left lower lobe, largest measuring 9 mm. Findings are favored to be infectious or inflammatory in etiology. Continued CT follow-up is recommended   CT Soft Tissue Neck 04/21/2024 IMPRESSION: 9 mm well-circumscribed round mass at the junction of the superficial and deep lobes  of the left parotid gland which correlates with the abnormality seen on the PET study. 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     PET Scan 01/17/2024 The nodular areas along the left lower lobe on the recent CT scan do not show abnormal uptake. Follow up CT scan 3 months may be useful. More aggressive approach could be considered as clinically appropriate.   There is a focal nodule along the deep lobe of left parotid gland with abnormal uptake. Intrinsic parotid lesion is possible. Please correlate for known history or additional workup with MRI pre and post contrast may be useful to further delineation of a aggressive lesion.   Stable central mesenteric stranding. 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 Coronary artery calcifications. Please correlate for other coronary risk factors.   Slight wall thickening of the urinary bladder with a prominent prostate.   LDCT 12/21/2023 No significant thyroid  nodules. Unremarkable esophagus. No pathologically enlarged axillary, mediastinal or hilar lymph nodes, noting limited sensitivity for the detection of hilar adenopathy on this noncontrast study.   Lungs/Pleura: No pneumothorax. No pleural effusion. Mild centrilobular emphysema with diffuse bronchial wall thickening. No acute consolidative airspace disease or lung masses. New irregular solid anterior peripheral basilar left lower lobe 13.1 mm pulmonary nodule on series 4/image 265. New irregular solid 6.6 mm anterior peripheral left lower lobe pulmonary nodule on series 4/image 242. Indistinct 4.8 mm posterior left lower lobe nodule on series 4/image 225 is stable.   Upper abdomen: No acute abnormality.   Musculoskeletal: No aggressive appearing focal osseous lesions. Mild thoracic  spondylosis.   IMPRESSION: 1. Lung-RADS 4B, suspicious. Additional imaging evaluation or consultation with Pulmonology or Thoracic Surgery recommended. New irregular solid 13.1 mm and 6.6 mm anterior peripheral left lower lobe pulmonary nodules. Suggest further evaluation with PET-CT at this time.     Assessment & Plan:   Assessment & Plan   No orders of the defined types were placed in this encounter.     No follow-ups on file.   Jareli Highland, MD

## 2024-06-09 ENCOUNTER — Other Ambulatory Visit: Payer: Self-pay

## 2024-06-09 ENCOUNTER — Telehealth: Payer: Self-pay | Admitting: Acute Care

## 2024-06-09 ENCOUNTER — Emergency Department (HOSPITAL_COMMUNITY)

## 2024-06-09 ENCOUNTER — Ambulatory Visit (INDEPENDENT_AMBULATORY_CARE_PROVIDER_SITE_OTHER): Admitting: Pulmonary Disease

## 2024-06-09 ENCOUNTER — Encounter: Payer: Self-pay | Admitting: Pulmonary Disease

## 2024-06-09 ENCOUNTER — Encounter (HOSPITAL_COMMUNITY): Payer: Self-pay | Admitting: Emergency Medicine

## 2024-06-09 ENCOUNTER — Emergency Department (HOSPITAL_COMMUNITY)
Admission: EM | Admit: 2024-06-09 | Discharge: 2024-06-09 | Disposition: A | Discharge status: Home / Self Care | Attending: Emergency Medicine | Admitting: Emergency Medicine

## 2024-06-09 ENCOUNTER — Telehealth: Payer: Self-pay | Admitting: Pulmonary Disease

## 2024-06-09 VITALS — BP 128/72 | HR 60 | Ht 72.0 in | Wt 166.0 lb

## 2024-06-09 DIAGNOSIS — R918 Other nonspecific abnormal finding of lung field: Secondary | ICD-10-CM | POA: Diagnosis not present

## 2024-06-09 DIAGNOSIS — J449 Chronic obstructive pulmonary disease, unspecified: Secondary | ICD-10-CM | POA: Insufficient documentation

## 2024-06-09 DIAGNOSIS — F17210 Nicotine dependence, cigarettes, uncomplicated: Secondary | ICD-10-CM | POA: Insufficient documentation

## 2024-06-09 DIAGNOSIS — R0683 Snoring: Secondary | ICD-10-CM

## 2024-06-09 DIAGNOSIS — I10 Essential (primary) hypertension: Secondary | ICD-10-CM | POA: Insufficient documentation

## 2024-06-09 DIAGNOSIS — R0789 Other chest pain: Secondary | ICD-10-CM | POA: Diagnosis not present

## 2024-06-09 DIAGNOSIS — R079 Chest pain, unspecified: Secondary | ICD-10-CM | POA: Diagnosis not present

## 2024-06-09 DIAGNOSIS — Z8673 Personal history of transient ischemic attack (TIA), and cerebral infarction without residual deficits: Secondary | ICD-10-CM | POA: Insufficient documentation

## 2024-06-09 DIAGNOSIS — E119 Type 2 diabetes mellitus without complications: Secondary | ICD-10-CM | POA: Insufficient documentation

## 2024-06-09 DIAGNOSIS — I2510 Atherosclerotic heart disease of native coronary artery without angina pectoris: Secondary | ICD-10-CM | POA: Diagnosis not present

## 2024-06-09 DIAGNOSIS — Z743 Need for continuous supervision: Secondary | ICD-10-CM | POA: Diagnosis not present

## 2024-06-09 LAB — BASIC METABOLIC PANEL WITH GFR
Anion gap: 10 (ref 5–15)
BUN: 16 mg/dL (ref 6–20)
CO2: 28 mmol/L (ref 22–32)
Calcium: 9.4 mg/dL (ref 8.9–10.3)
Chloride: 103 mmol/L (ref 98–111)
Creatinine, Ser: 0.76 mg/dL (ref 0.61–1.24)
GFR, Estimated: >60 mL/min (ref >60)
Glucose, Bld: 135 mg/dL — ABNORMAL HIGH (ref 70–99)
Potassium: 3.9 mmol/L (ref 3.5–5.1)
Sodium: 140 mmol/L (ref 135–145)

## 2024-06-09 LAB — CBC
HCT: 41.7 % (ref 39.0–52.0)
Hemoglobin: 14.4 g/dL (ref 13.0–17.0)
MCH: 32.1 pg (ref 26.0–34.0)
MCHC: 34.5 g/dL (ref 30.0–36.0)
MCV: 92.9 fL (ref 80.0–100.0)
Platelets: 177 K/uL (ref 150–400)
RBC: 4.49 MIL/uL (ref 4.22–5.81)
RDW: 12.5 % (ref 11.5–15.5)
WBC: 4.9 K/uL (ref 4.0–10.5)
nRBC: 0 % (ref 0.0–0.2)

## 2024-06-09 LAB — PROTIME-INR
INR: 1.2 (ref 0.8–1.2)
Prothrombin Time: 15.3 s — ABNORMAL HIGH (ref 11.4–15.2)

## 2024-06-09 LAB — TROPONIN T, HIGH SENSITIVITY
Troponin T High Sensitivity: <15 ng/L (ref 0–19)
Troponin T High Sensitivity: <15 ng/L (ref 0–19)

## 2024-06-09 MED ORDER — POTASSIUM CHLORIDE 20 MEQ PO PACK
40.0000 meq | PACK | Freq: Once | ORAL | Status: AC
  Administered 2024-06-09: 40 meq via ORAL
  Filled 2024-06-09: qty 2

## 2024-06-09 MED ORDER — POTASSIUM CHLORIDE CRYS ER 20 MEQ PO TBCR
40.0000 meq | EXTENDED_RELEASE_TABLET | Freq: Once | ORAL | Status: DC
  Filled 2024-06-09: qty 2

## 2024-06-09 MED ORDER — ACETAMINOPHEN 500 MG PO TABS
1000.0000 mg | ORAL_TABLET | Freq: Once | ORAL | Status: AC
  Administered 2024-06-09: 1000 mg via ORAL
  Filled 2024-06-09: qty 2

## 2024-06-09 NOTE — Telephone Encounter (Signed)
 Patient requests transition of care from Camie Lites, NP to Dr. Catherine.  Patient lives in the [place] area

## 2024-06-09 NOTE — ED Provider Notes (Signed)
 [facility] Emergency Department Provider Note MRN:  [id number]  Arrival date & time: 06/09/24     Chief Complaint   Chest Pain   History of Present Illness   Richard [name] is a 58 y.o. year-old male with a history of CAD, COPD, TIA, A-fib presenting to the ED with chief complaint of chest pain.  Pain in the chest with palpitations starting suddenly at home prior to arrival.  Palpitations have resolved, still having some sharp chest pain worse with deep breaths.  Review of Systems  A thorough review of systems was obtained and all systems are negative except as noted in the HPI and PMH.   Patient's Health History    Past Medical History:  Diagnosis Date   Arthritis    Asthma    CAD (coronary artery disease)    a. Cardiac cath 07/2015 showed 65% distal Cx, 20% mid-distal LAD, 20% prox-distal RCA, EF 60%, EDP .   Colitis 1990   COPD (chronic obstructive pulmonary disease) (HCC)    Depression    Dysrhythmia    Essential hypertension    Fatty liver    Gastric ulcer 2003; 2012   2003: + esophagitis; negative H.pylori serology  2012: Dr. Harvey, mild gastritis, Bravo PH probe placement, negative H.pylori   GERD (gastroesophageal reflux disease)    Hepatic steatosis    History of hiatal hernia    Hyperlipemia    Overweight    Panic attacks    Paroxysmal atrial fibrillation (HCC)    Pulmonary nodules    Stroke (HCC)    TIA (transient ischemic attack)    Type II diabetes mellitus (HCC)     Past Surgical History:  Procedure Laterality Date   BALLOON DILATION N/A 07/25/2021   Procedure: BALLOON DILATION;  Surgeon: Cindie Carlin POUR, DO;  Location: [facility] ENDO SU[facility];  Service: Endoscopy;  Laterality: N/A;   BIOPSY  07/25/2021   Procedure: BIOPSY;  Surgeon: Cindie Carlin POUR, DO;  Location: [facility];  Service: Endoscopy;;   BIOPSY  12/04/2022   Procedure: BIOPSY;  Surgeon: Cindie Carlin POUR, DO;  Location: [facility];  Service: Endoscopy;;    BRAVO PH STUDY  05/03/2011   DOQ:Fpoi gastritis/normal esophagus and duodenum   CARDIAC CATHETERIZATION  1990s X 1; 2005; 08/12/2015   CARDIAC CATHETERIZATION N/A 08/12/2015   Procedure: Left Heart Cath and Coronary Angiography;  Surgeon: Victory LELON Sharps, MD; LAD 20%, CFX 65%, RCA 20%, EF 60%    COLONOSCOPY  1990   COLONOSCOPY WITH PROPOFOL  N/A 11/21/2016   Dr. Harvey: non-thrombosed external hemorrhoids, one 6 mm polyp (polypoid lesion), internal hemorrhoids. TI Normal. 10 years screening   ESOPHAGOGASTRODUODENOSCOPY  05/03/2011   DOQ:fpoi gastritis   ESOPHAGOGASTRODUODENOSCOPY (EGD) WITH PROPOFOL  N/A 07/25/2021   Procedure: ESOPHAGOGASTRODUODENOSCOPY (EGD) WITH PROPOFOL ;  Surgeon: Cindie Carlin POUR, DO;  Location: [facility];  Service: Endoscopy;  Laterality: N/A;  1:30pm   ESOPHAGOGASTRODUODENOSCOPY (EGD) WITH PROPOFOL  N/A 12/04/2022   Procedure: ESOPHAGOGASTRODUODENOSCOPY (EGD) WITH PROPOFOL ;  Surgeon: Cindie Carlin POUR, DO;  Location: [facility];  Service: Endoscopy;  Laterality: N/A;  8:00AM;ASA 3   NECK MASS EXCISION Right    done in dr's office; behind right ear/side of ncek   POLYPECTOMY  11/21/2016   Procedure: POLYPECTOMY;  Surgeon: Harvey Margo CROME, MD;  Location: [facility];  Service: Endoscopy;;  descending colon polyp   SHOULDER ARTHROSCOPY W/ ROTATOR CUFF REPAIR Right 2006   acromioclavicular joint arthrosis    Family History  Problem Relation Age of Onset   Lung cancer Mother    Alcohol abuse Mother    Heart attack Father 21   Diabetes Father    Alcohol abuse Father    Asthma Sister    Anxiety disorder Sister    Depression Sister    Anxiety disorder Sister    Hypertension Brother    Hypertension Brother    Heart attack Brother 65   Diabetes Brother    Hypertension Brother    Seizures Brother    Dementia Paternal Uncle    ADD / ADHD Daughter    Dementia Cousin    Colon cancer Neg Hx    Drug abuse Neg Hx    Bipolar disorder Neg Hx    OCD Neg Hx    Paranoid  behavior Neg Hx    Schizophrenia Neg Hx    Sexual abuse Neg Hx    Physical abuse Neg Hx     Social History   Socioeconomic History   Marital status: Married    Spouse name: Not on file   Number of children: Not on file   Years of education: Not on file   Highest education level: Not on file  Occupational History   Occupation: full time    Employer: UNEMPLOYED  Tobacco Use   Smoking status: Every Day    Current packs/day: 0.50    Average packs/day: 1 pack/day for 41.9 years (40.2 ttl pk-yrs)    Types: Cigarettes    Start date: 07/17/1982   Smokeless tobacco: Never   Tobacco comments:    1/2 pack a day 05/13/2024 KRD    1 pack lasts pt 3 days   Vaping Use   Vaping status: Never Used  Substance and Sexual Activity   Alcohol use: Not Currently   Drug use: No   Sexual activity: Yes    Birth control/protection: None  Other Topics Concern   Not on file  Social History Narrative   Pt lives in [place] with wife.  5 children.  Unemployed due to panic attacks and back pain   Social Drivers of Health   Financial Resource Strain: Not on file  Food Insecurity: No Food Insecurity (04/15/2024)   Hunger Vital Sign    Worried About Running Out of Food in the Last Year: Never true    Ran Out of Food in the Last Year: Never true  Transportation Needs: No Transportation Needs (04/15/2024)   PRAPARE - Administrator, Civil Service (Medical): No    Lack of Transportation (Non-Medical): No  Physical Activity: Not on file  Stress: Not on file  Social Connections: Not on file  Intimate Partner Violence: Not At Risk (04/15/2024)   Humiliation, Afraid, Rape, and Kick questionnaire    Fear of Current or Ex-Partner: No    Emotionally Abused: No    Physically Abused: No    Sexually Abused: No     Physical Exam   Vitals:   06/09/24 0315 06/09/24 0330  BP: 111/70 104/69  Pulse: (!) 53 (!) 52  Resp: 19 12  Temp:    SpO2: 98% 97%    CONSTITUTIONAL: Well-appearing,  NAD NEURO/PSYCH:  Alert and oriented x 3, no focal deficits EYES:  eyes equal and reactive ENT/NECK:  no LAD, no JVD CARDIO: Regular rate, well-perfused, normal S1 and S2 PULM:  CTAB no wheezing or rhonchi GI/GU:  non-distended, non-tender MSK/SPINE:  No gross deformities, no edema SKIN:  no rash, atraumatic   *Additional and/or pertinent  findings included in MDM below  Diagnostic and Interventional Summary    EKG Interpretation Date/Time:  Monday June 09 2024 00:58:01 EST Ventricular Rate:  56 PR Interval:  182 QRS Duration:  93 QT Interval:  411 QTC Calculation: 397 R Axis:   81  Text Interpretation: Sinus rhythm Confirmed by Theadore Sharper [phone]) on 06/09/2024 1:35:21 AM       Labs Reviewed  BASIC METABOLIC PANEL WITH GFR - Abnormal; Notable for the following components:      Result Value   Glucose, Bld 135 (*)    All other components within normal limits  PROTIME-INR - Abnormal; Notable for the following components:   Prothrombin Time 15.3 (*)    All other components within normal limits  CBC  TROPONIN T, HIGH SENSITIVITY  TROPONIN T, HIGH SENSITIVITY    DG Chest Port 1 View  Final Result      Medications  acetaminophen  (TYLENOL ) tablet 1,000 mg (1,000 mg Oral Given 06/09/24 0237)  potassium chloride  (KLOR-CON ) packet 40 mEq (40 mEq Oral Given 06/09/24 0242)     Procedures  /  Critical Care Procedures  ED Course and Medical Decision Making  Initial Impression and Ddx Suspect episode of A-fib with RVR at home, here patient is on sinus rhythm with heart rate in the 50s or 60s.  Still having some chest pain.  ACS is considered.  Patient is fully compliant with his Xarelto , PE considered but felt to be less likely.  Past medical/surgical history that increases complexity of ED encounter: A-fib, CAD  Interpretation of Diagnostics I personally reviewed the EKG and my interpretation is as follows: Sinus rhythm without ischemic findings  No significant  blood count or electrolyte disturbance.  Troponin negative x 2  Patient Reassessment and Ultimate Disposition/Management     No symptoms while here in the emergency department during observation period of 3+ hours.  Pain is atypical, overall doubt ischemic etiology.  Suspect patient had an episode of A-fib, discharged home with cardiology follow-up.  Patient management required discussion with the following services or consulting groups:  None  Complexity of Problems Addressed Acute illness or injury that poses threat of life of bodily function  Additional Data Reviewed and Analyzed Further history obtained from: Further history from spouse/family member  Additional Factors Impacting ED Encounter Risk Consideration of hospitalization  Sharper HERO. Theadore, MD [facility] Emergency Medicine [facility] [email or web address]  Final Clinical Impressions(s) / ED Diagnoses     ICD-10-CM   1. Chest pain, unspecified type  R07.9       ED Discharge Orders     None        Discharge Instructions Discussed with and Provided to Patient:     Discharge Instructions      You were evaluated in the Emergency Department and after careful evaluation, we did not find any emergent condition requiring admission or further testing in the hospital.  Your exam/testing today is overall reassuring.  Recommend close follow-up with your cardiologist to discuss her symptoms.  Please return to the Emergency Department if you experience any worsening of your condition.   Thank you for allowing us  to be a part of your care.       Theadore Sharper HERO, MD 06/09/24 [id number]

## 2024-06-09 NOTE — Patient Instructions (Signed)
  VISIT SUMMARY: You visited us  today to discuss your sleep apnea symptoms, lung nodules, and panic attacks. We have planned further evaluations and follow-ups to address these issues.  YOUR PLAN: SUSPECTED SLEEP APNEA AND SNORING: You have symptoms of sleep apnea, including loud snoring, episodes of apnea, and feeling unrested despite sleeping through the night. Your use of Xanax  may also contribute to your sleep issues. -We have ordered a home sleep test to evaluate your sleep apnea. -Continue taking Xanax  as needed for sleep.  PULMONARY NODULES: You have multiple lung nodules, some of which may be related to mold exposure in your home. There is no evidence of malignancy at this time. -You are scheduled for a CT scan in January to monitor the pulmonary nodules.  Contains text generated by [employer].

## 2024-06-09 NOTE — Telephone Encounter (Signed)
 ERROR

## 2024-06-09 NOTE — Discharge Instructions (Signed)
 You were evaluated in the Emergency Department and after careful evaluation, we did not find any emergent condition requiring admission or further testing in the hospital.  Your exam/testing today is overall reassuring.  Recommend close follow-up with your cardiologist to discuss her symptoms.  Please return to the Emergency Department if you experience any worsening of your condition.   Thank you for allowing us  to be a part of your care.

## 2024-06-09 NOTE — ED Triage Notes (Addendum)
 Pt c/o heart racing and states chest hurts when he takes a deep breath. He also c/o headache. Pt took 2 xanax  before ems arrived.

## 2024-06-09 NOTE — ED Notes (Signed)
Rainbow of tubes sent to lab.

## 2024-06-09 NOTE — Telephone Encounter (Signed)
 Called and got his follow up scheduled for Feb 9th to see Dr.A,appointment canceled with [email or web address]

## 2024-06-13 ENCOUNTER — Ambulatory Visit
Admission: EM | Admit: 2024-06-13 | Discharge: 2024-06-13 | Disposition: A | Discharge status: Home / Self Care | Attending: Family Medicine | Admitting: Family Medicine

## 2024-06-13 ENCOUNTER — Encounter: Payer: Self-pay | Admitting: Emergency Medicine

## 2024-06-13 ENCOUNTER — Ambulatory Visit: Payer: Self-pay

## 2024-06-13 DIAGNOSIS — K047 Periapical abscess without sinus: Secondary | ICD-10-CM | POA: Diagnosis not present

## 2024-06-13 MED ORDER — CLINDAMYCIN PALMITATE HCL 75 MG/5ML PO SOLR: 300.0000 mg | Freq: Two times a day (BID) | ORAL | 0 refills | Status: AC

## 2024-06-13 NOTE — ED Triage Notes (Signed)
 Dental pain on left lower side x 2 days.  Has an appointment dentist on Tuesday.   States he feels some swelling on left side of neck

## 2024-06-13 NOTE — ED Provider Notes (Signed)
 [facility]    CSN: [id number] Arrival date & time: 06/13/24  1353      History   Chief Complaint No chief complaint on file.   HPI Richard [name] is a 58 y.o. male.   Patient presenting today with several day history of progressively worsening left lower dental pain, facial swelling.  Was recently treated with a course of clindamycin  with good temporary relief but symptoms returned just prior to dental appointment scheduled for this coming Tuesday.  Denies fever, chills, difficulty breathing or swallowing, weakness numbness or tingling.  Taking the Peridex  rinse and over-the-counter pain relievers with minimal relief.    Past Medical History:  Diagnosis Date   Arthritis    Asthma    CAD (coronary artery disease)    a. Cardiac cath 07/2015 showed 65% distal Cx, 20% mid-distal LAD, 20% prox-distal RCA, EF 60%, EDP .   Colitis 1990   COPD (chronic obstructive pulmonary disease) (HCC)    Depression    Dysrhythmia    Essential hypertension    Fatty liver    Gastric ulcer 2003; 2012   2003: + esophagitis; negative H.pylori serology  2012: Dr. Harvey, mild gastritis, Bravo PH probe placement, negative H.pylori   GERD (gastroesophageal reflux disease)    Hepatic steatosis    History of hiatal hernia    Hyperlipemia    Overweight    Panic attacks    Paroxysmal atrial fibrillation (HCC)    Pulmonary nodules    Stroke [facility])    TIA (transient ischemic attack)    Type II diabetes mellitus [facility])     Patient Active Problem List   Diagnosis Date Noted   Chest pain 04/14/2024   Cough productive of purulent sputum 12/10/2023   Leg cramps 08/14/2023   Encounter for examination following treatment at hospital 08/14/2023   UTI (urinary tract infection) 07/03/2023   Prediabetes 06/19/2023   Dermatitis 06/19/2023   Fatigue 02/16/2023   Delayed wound healing 12/05/2022   Prostate cancer screening 11/10/2022   Arm pain, left 08/04/2022   Upper airway cough  syndrome vs cough variant asthma 03/14/2022   Abnormal chest CT 11/16/2021   Pulmonary nodules 11/16/2021   RLQ abdominal pain 08/14/2021   Abnormal weight loss 08/14/2021   Acute ischemic stroke (HCC) 08/03/2021   Hypokalemia 08/03/2021   Hyperglycemia due to diabetes mellitus (HCC) 08/03/2021   Atrial fibrillation, chronic (HCC) 08/03/2021   Secondary hypercoagulable state 04/27/2021   Paroxysmal atrial fibrillation (HCC)    Lateral epicondylitis, left elbow 03/27/2019   Globus sensation 02/21/2018   Other cervical disc degeneration, unspecified cervical region 01/11/2018   Tendinitis of right triceps 01/11/2018   Neck pain 12/27/2017   Lateral epicondylitis, right elbow 05/10/2017   Constipation 12/24/2015   DOE (dyspnea on exertion) 11/17/2015   Coronary artery disease    Heart palpitations 08/12/2015   TIA (transient ischemic attack) 08/12/2015   Colon cancer screening 08/02/2015   Abdominal pain 12/18/2014   Encounter for screening colonoscopy 12/18/2014   Vitamin D  deficiency 08/20/2012   Arteriosclerotic cardiovascular disease (ASCVD) 04/11/2012   Dyspepsia 11/29/2011   Chronic low back pain    Dysphagia 05/09/2011   Essential hypertension    Anxiety and depression    Controlled diabetes mellitus type 2 with complications (HCC) 10/03/2010   Cigarette smoker 09/14/2010   COPD (chronic obstructive pulmonary disease) (HCC) 09/14/2010   Mixed hyperlipidemia 11/25/2009   GERD (gastroesophageal reflux disease) 04/05/2009   Hepatic steatosis 04/05/2009    Past Surgical  History:  Procedure Laterality Date   BALLOON DILATION N/A 07/25/2021   Procedure: BALLOON DILATION;  Surgeon: Cindie Carlin POUR, DO;  [facility];  Service: Endoscopy;  Laterality: N/A;   BIOPSY  07/25/2021   Procedure: BIOPSY;  Surgeon: Cindie Carlin POUR, DO;  [facility];  Service: Endoscopy;;   BIOPSY  12/04/2022   Procedure: BIOPSY;  Surgeon: Cindie Carlin POUR, DO;  Location: [facility];  Service: Endoscopy;;   BRAVO PH STUDY  05/03/2011   DOQ:Fpoi gastritis/normal esophagus and duodenum   CARDIAC CATHETERIZATION  1990s X 1; 2005; 08/12/2015   CARDIAC CATHETERIZATION N/A 08/12/2015   Procedure: Left Heart Cath and Coronary Angiography;  Surgeon: Victory LELON Sharps, MD; LAD 20%, CFX 65%, RCA 20%, EF 60%    COLONOSCOPY  1990   COLONOSCOPY WITH PROPOFOL  N/A 11/21/2016   Dr. Harvey: non-thrombosed external hemorrhoids, one 6 mm polyp (polypoid lesion), internal hemorrhoids. TI Normal. 10 years screening   ESOPHAGOGASTRODUODENOSCOPY  05/03/2011   DOQ:fpoi gastritis   ESOPHAGOGASTRODUODENOSCOPY (EGD) WITH PROPOFOL  N/A 07/25/2021   Procedure: ESOPHAGOGASTRODUODENOSCOPY (EGD) WITH PROPOFOL ;  Surgeon: Cindie Carlin POUR, DO;  [facility];  Service: Endoscopy;  Laterality: N/A;  1:30pm   ESOPHAGOGASTRODUODENOSCOPY (EGD) WITH PROPOFOL  N/A 12/04/2022   Procedure: ESOPHAGOGASTRODUODENOSCOPY (EGD) WITH PROPOFOL ;  Surgeon: Cindie Carlin POUR, DO;  [facility];  Service: Endoscopy;  Laterality: N/A;  8:00AM;ASA 3   NECK MASS EXCISION Right    done in dr's office; behind right ear/side of ncek   POLYPECTOMY  11/21/2016   Procedure: POLYPECTOMY;  Surgeon: Harvey Margo CROME, MD;  [facility];  Service: Endoscopy;;  descending colon polyp   SHOULDER ARTHROSCOPY W/ ROTATOR CUFF REPAIR Right 2006   acromioclavicular joint arthrosis       Home Medications    Prior to Admission medications   Medication Sig Start Date End Date Taking? Authorizing Provider  clindamycin  (CLEOCIN ) 75 MG/5ML solution Take 20 mLs (300 mg total) by mouth 2 (two) times daily for 7 days. 06/13/24 06/20/24 Yes Stuart Vernell Norris, PA-C  acetaminophen  (TYLENOL ) 325 MG tablet Take 650 mg by mouth every 6 (six) hours as needed for moderate pain (pain score 4-6).    [provider]  albuterol  (VENTOLIN  HFA) 108 (90 Base) MCG/ACT inhaler INHALE 2 PUFFS BY MOUTH EVERY 6 HOURS AS  NEEDED FOR COUGHING, WHEEZING, OR SHORTNESS OF BREATH 05/26/24   Ruthell Lauraine FALCON, NP  ALPRAZolam  (XANAX ) 0.5 MG tablet Take one twice a day and two at bedtime 05/16/24   Okey Barnie SAUNDERS, MD  amLODipine  (NORVASC ) 10 MG tablet Take 1 tablet (10 mg total) by mouth daily. 02/03/24   Zhao, Xika, NP  budesonide -formoterol  (SYMBICORT ) 160-4.5 MCG/ACT inhaler Take 2 puffs first thing in am and then another 2 puffs about 12 hours later. 01/14/24   Darlean Ozell NOVAK, MD  chlorhexidine  (PERIDEX ) 0.12 % solution Use as directed 15 mLs in the mouth or throat 2 (two) times daily. 05/30/24   Stuart Vernell Norris, PA-C  EPINEPHrine  0.3 mg/0.3 mL IJ SOAJ injection Inject 0.3 mg into the muscle as needed for anaphylaxis. 09/24/23   Tobie Arleta SQUIBB, MD  esomeprazole  (NEXIUM ) 40 MG capsule Take 1 capsule (40 mg total) by mouth 2 (two) times daily before a meal. 04/08/24   Shirlean Therisa LELON, NP  lidocaine  (XYLOCAINE ) 2 % solution Use as directed 15 mLs in the mouth or throat every 3 (three) hours as needed for mouth pain. 01/15/24  Chandra Raisin A, NP  lidocaine  (XYLOCAINE ) 2 % solution Use as directed 10 mLs in the mouth or throat every 3 (three) hours as needed. 05/30/24   Stuart Vernell Norris, PA-C  linaclotide  (LINZESS ) 72 MCG capsule Take 1 capsule (72 mcg total) by mouth daily before breakfast for 12 days. 05/12/24 06/09/24  Ezzard Sonny RAMAN, PA-C  metoprolol  tartrate (LOPRESSOR ) 25 MG tablet Take 12.5 mg by mouth 2 (two) times daily.    [provider]  nitroGLYCERIN  (NITROSTAT ) 0.4 MG SL tablet Place 1 tablet (0.4 mg total) under the tongue every 5 (five) minutes x 3 doses as needed for chest pain (if no relief after 3rd dose, proceed to the ED for an evaluation). 05/29/24   Strader, Brittany M, PA-C  potassium chloride  SA (KLOR-CON  M20) 20 MEQ tablet Take 1 tablet (20 mEq total) by mouth daily. 04/29/24   Dunn, Dayna N, PA-C  rivaroxaban  (XARELTO ) 20 MG TABS tablet TAKE 1 TABLET BY MOUTH ONCE DAILY WITH SUPPER  04/18/24   Debera Jayson MATSU, MD  simvastatin  (ZOCOR ) 20 MG tablet Take 1 tablet by mouth once daily 06/22/23   Waddell Danelle ORN, MD  Spacer/Aero-Holding Chambers (AEROCHAMBER PLUS) Device To be used as directed with inhaler for better technique and greater benefit 05/13/24   Ruthell Lauraine FALCON, NP  traMADol  (ULTRAM ) 50 MG tablet Take 1 tablet (50 mg total) by mouth every 12 (twelve) hours as needed. 04/22/24   Leath-Warren, Etta PARAS, NP  vitamin B-12 (CYANOCOBALAMIN ) 50 MCG tablet Take 50 mcg by mouth daily.    [provider]  VITAMIN D  PO Take 1 tablet by mouth daily.    [provider]    Family History Family History  Problem Relation Age of Onset   Lung cancer Mother    Alcohol abuse Mother    Heart attack Father 79   Diabetes Father    Alcohol abuse Father    Asthma Sister    Anxiety disorder Sister    Depression Sister    Anxiety disorder Sister    Hypertension Brother    Hypertension Brother    Heart attack Brother 11   Diabetes Brother    Hypertension Brother    Seizures Brother    Dementia Paternal Uncle    ADD / ADHD Daughter    Dementia Cousin    Colon cancer Neg Hx    Drug abuse Neg Hx    Bipolar disorder Neg Hx    OCD Neg Hx    Paranoid behavior Neg Hx    Schizophrenia Neg Hx    Sexual abuse Neg Hx    Physical abuse Neg Hx     Social History Social History   Tobacco Use   Smoking status: Every Day    Current packs/day: 0.50    Average packs/day: 1 pack/day for 41.9 years (40.2 ttl pk-yrs)    Types: Cigarettes    Start date: 07/17/1982   Smokeless tobacco: Never   Tobacco comments:    1/2 pack a day 05/13/2024 KRD    1 pack lasts pt 3 days   Vaping Use   Vaping status: Never Used  Substance Use Topics   Alcohol use: Not Currently   Drug use: No     Allergies   Alpha-gal, Dexilant  [dexlansoprazole ], Mushroom ext cmplx(shiitake-reishi-mait), Penicillins, Clindamycin /lincomycin, and Doxycycline   Review of Systems Review of  Systems Per HPI  Physical Exam Triage Vital Signs ED Triage Vitals  Encounter Vitals Group     BP 06/13/24  1405 129/76     Girls Systolic BP Percentile --      Girls Diastolic BP Percentile --      Boys Systolic BP Percentile --      Boys Diastolic BP Percentile --      Pulse Rate 06/13/24 1405 (!) 58     Resp 06/13/24 1405 18     Temp 06/13/24 1405 97.7 F (36.5 C)     Temp Source 06/13/24 1405 Oral     SpO2 06/13/24 1405 97 %     Weight --      Height --      Head Circumference --      Peak Flow --      Pain Score 06/13/24 1407 2     Pain Loc --      Pain Education --      Exclude from Growth Chart --    No data found.  Updated Vital Signs BP 129/76 (BP Location: Right Arm)   Pulse (!) 58   Temp 97.7 F (36.5 C) (Oral)   Resp 18   SpO2 97%   Visual Acuity Right Eye Distance:   Left Eye Distance:   Bilateral Distance:    Right Eye Near:   Left Eye Near:    Bilateral Near:     Physical Exam Vitals and nursing note reviewed.  Constitutional:      Appearance: Normal appearance.  HENT:     Head: Atraumatic.     Mouth/Throat:     Mouth: Mucous membranes are moist.     Comments: Left lower molar region with gingival erythema and edema Eyes:     Extraocular Movements: Extraocular movements intact.     Conjunctiva/sclera: Conjunctivae normal.  Cardiovascular:     Rate and Rhythm: Normal rate.  Pulmonary:     Effort: Pulmonary effort is normal.  Musculoskeletal:        General: Normal range of motion.     Cervical back: Normal range of motion and neck supple.  Skin:    General: Skin is warm and dry.  Neurological:     Mental Status: He is oriented to person, place, and time.  Psychiatric:        Mood and Affect: Mood normal.        Thought Content: Thought content normal.        Judgment: Judgment normal.      UC Treatments / Results  Labs (all labs ordered are listed, but only abnormal results are displayed) Labs Reviewed - No data to  display  EKG   Radiology No results found.  Procedures Procedures (including critical care time)  Medications Ordered in UC Medications - No data to display  Initial Impression / Assessment and Plan / UC Course  I have reviewed the triage vital signs and the nursing notes.  Pertinent labs & imaging results that were available during my care of the patient were reviewed by me and considered in my medical decision making (see chart for details).     Will refill clindamycin  for another round to get him until his dentist appointment on Tuesday.  Continue the Peridex  rinse, over-the-counter pain relievers.  He states he does well on the liquid clindamycin  as long as he takes the Carafate  first.  Continue this plan.  Final Clinical Impressions(s) / UC Diagnoses   Final diagnoses:  Dental infection   Discharge Instructions   None    ED Prescriptions     Medication Sig Dispense Auth. Provider  clindamycin  (CLEOCIN ) 75 MG/5ML solution Take 20 mLs (300 mg total) by mouth 2 (two) times daily for 7 days. 280 mL Stuart Vernell Norris, [place]      PDMP not reviewed this encounter.   Stuart Vernell Norris, [place] 06/13/24 1501

## 2024-06-18 ENCOUNTER — Telehealth: Payer: Self-pay | Admitting: Cardiology

## 2024-06-18 DIAGNOSIS — Z79899 Other long term (current) drug therapy: Secondary | ICD-10-CM

## 2024-06-18 NOTE — Telephone Encounter (Signed)
 Wife says patient was having issues over the weekend which wife reports thumping heart He took Xanax  and an extra potassium because wife reports she was told to keep his potassium between a 4 and a five because he is a heart patient. He did not take any extra Lopressor  because his HR was 60's and 70's. Wife feels his potassium is low.  No N/V/D reported.No muscle cramps either.   Please advise.

## 2024-06-18 NOTE — Telephone Encounter (Signed)
 Wife Gery) is concern about patient's potassium level and wants to get orders for lab work.

## 2024-06-18 NOTE — Telephone Encounter (Signed)
 Wife made aware, will have done at [facility].

## 2024-06-19 ENCOUNTER — Encounter: Payer: Self-pay | Admitting: Gastroenterology

## 2024-06-20 ENCOUNTER — Ambulatory Visit: Payer: Self-pay | Admitting: Student

## 2024-06-20 LAB — MAGNESIUM: Magnesium: 2.1 mg/dL (ref 1.6–2.3)

## 2024-06-20 LAB — POTASSIUM: Potassium: 4.2 mmol/L (ref 3.5–5.2)

## 2024-06-21 ENCOUNTER — Encounter (HOSPITAL_COMMUNITY): Payer: Self-pay | Admitting: Emergency Medicine

## 2024-06-21 ENCOUNTER — Emergency Department (HOSPITAL_COMMUNITY)

## 2024-06-21 ENCOUNTER — Emergency Department (HOSPITAL_COMMUNITY)
Admission: EM | Admit: 2024-06-21 | Discharge: 2024-06-22 | Disposition: A | Attending: Emergency Medicine | Admitting: Emergency Medicine

## 2024-06-21 ENCOUNTER — Other Ambulatory Visit: Payer: Self-pay

## 2024-06-21 DIAGNOSIS — Z7901 Long term (current) use of anticoagulants: Secondary | ICD-10-CM | POA: Diagnosis not present

## 2024-06-21 DIAGNOSIS — I10 Essential (primary) hypertension: Secondary | ICD-10-CM | POA: Insufficient documentation

## 2024-06-21 DIAGNOSIS — Z8673 Personal history of transient ischemic attack (TIA), and cerebral infarction without residual deficits: Secondary | ICD-10-CM | POA: Insufficient documentation

## 2024-06-21 DIAGNOSIS — I499 Cardiac arrhythmia, unspecified: Secondary | ICD-10-CM | POA: Diagnosis not present

## 2024-06-21 DIAGNOSIS — Z79899 Other long term (current) drug therapy: Secondary | ICD-10-CM | POA: Insufficient documentation

## 2024-06-21 DIAGNOSIS — R0789 Other chest pain: Secondary | ICD-10-CM | POA: Insufficient documentation

## 2024-06-21 DIAGNOSIS — Z743 Need for continuous supervision: Secondary | ICD-10-CM | POA: Diagnosis not present

## 2024-06-21 DIAGNOSIS — R079 Chest pain, unspecified: Secondary | ICD-10-CM | POA: Diagnosis not present

## 2024-06-21 DIAGNOSIS — I2510 Atherosclerotic heart disease of native coronary artery without angina pectoris: Secondary | ICD-10-CM | POA: Diagnosis not present

## 2024-06-21 DIAGNOSIS — R918 Other nonspecific abnormal finding of lung field: Secondary | ICD-10-CM

## 2024-06-21 DIAGNOSIS — J449 Chronic obstructive pulmonary disease, unspecified: Secondary | ICD-10-CM | POA: Insufficient documentation

## 2024-06-21 DIAGNOSIS — R1319 Other dysphagia: Secondary | ICD-10-CM

## 2024-06-21 DIAGNOSIS — R002 Palpitations: Secondary | ICD-10-CM | POA: Diagnosis not present

## 2024-06-21 DIAGNOSIS — E119 Type 2 diabetes mellitus without complications: Secondary | ICD-10-CM | POA: Insufficient documentation

## 2024-06-21 DIAGNOSIS — R1310 Dysphagia, unspecified: Secondary | ICD-10-CM | POA: Diagnosis not present

## 2024-06-21 DIAGNOSIS — I4891 Unspecified atrial fibrillation: Secondary | ICD-10-CM | POA: Diagnosis not present

## 2024-06-21 LAB — CBC
HCT: 41.6 % (ref 39.0–52.0)
Hemoglobin: 14.6 g/dL (ref 13.0–17.0)
MCH: 32.2 pg (ref 26.0–34.0)
MCHC: 35.1 g/dL (ref 30.0–36.0)
MCV: 91.6 fL (ref 80.0–100.0)
Platelets: 187 K/uL (ref 150–400)
RBC: 4.54 MIL/uL (ref 4.22–5.81)
RDW: 12.1 % (ref 11.5–15.5)
WBC: 6 K/uL (ref 4.0–10.5)
nRBC: 0 % (ref 0.0–0.2)

## 2024-06-21 LAB — BASIC METABOLIC PANEL WITH GFR
Anion gap: 8 (ref 5–15)
BUN: 14 mg/dL (ref 6–20)
CO2: 29 mmol/L (ref 22–32)
Calcium: 9.2 mg/dL (ref 8.9–10.3)
Chloride: 102 mmol/L (ref 98–111)
Creatinine, Ser: 0.8 mg/dL (ref 0.61–1.24)
GFR, Estimated: >60 mL/min (ref >60)
Glucose, Bld: 144 mg/dL — ABNORMAL HIGH (ref 70–99)
Potassium: 3.7 mmol/L (ref 3.5–5.1)
Sodium: 139 mmol/L (ref 135–145)

## 2024-06-21 LAB — PROTIME-INR
INR: 1.1 (ref 0.8–1.2)
Prothrombin Time: 14.8 s (ref 11.4–15.2)

## 2024-06-21 LAB — LIPASE, BLOOD: Lipase: 18 U/L (ref 11–51)

## 2024-06-21 LAB — HEPATIC FUNCTION PANEL
ALT: 11 U/L (ref 0–44)
AST: 14 U/L — ABNORMAL LOW (ref 15–41)
Albumin: 4.7 g/dL (ref 3.5–5.0)
Alkaline Phosphatase: 58 U/L (ref 38–126)
Bilirubin, Direct: 0.2 mg/dL (ref 0.0–0.2)
Indirect Bilirubin: 0.1 mg/dL — ABNORMAL LOW (ref 0.3–0.9)
Total Bilirubin: 0.3 mg/dL (ref 0.0–1.2)
Total Protein: 7.2 g/dL (ref 6.5–8.1)

## 2024-06-21 LAB — TROPONIN T, HIGH SENSITIVITY: Troponin T High Sensitivity: <15 ng/L (ref 0–19)

## 2024-06-21 MED ORDER — HYDROMORPHONE HCL 1 MG/ML IJ SOLN
1.0000 mg | Freq: Once | INTRAMUSCULAR | Status: AC
  Administered 2024-06-21: 1 mg via INTRAVENOUS
  Filled 2024-06-21: qty 1

## 2024-06-21 NOTE — ED Notes (Signed)
Pt going to Xray

## 2024-06-21 NOTE — ED Triage Notes (Signed)
 Pt BIB EMS from home complains of chest pain all day worse after dinner. Pt states taking a deep breath makes pain worse and feels like something in stuck in chest. Pt is ambulatory to bed from ems stretcher. Afib with ems  Took 325 aspirin  taken at home    EMS vitals.  20 LAC 125/73 97% ra

## 2024-06-21 NOTE — ED Notes (Signed)
 Pt endorses taking 0.5mg  Xanex at 2000 today.

## 2024-06-21 NOTE — ED Provider Notes (Signed)
  EMERGENCY DEPARTMENT AT [facility] Provider Note   CSN: [id number] Arrival date & time: 06/21/24  2216     Patient presents with: Chest Pain   Richard [name] is a 58 y.o. male.  {Add pertinent medical, surgical, social history, OB history to YEP:67052}  Chest Pain Associated symptoms: headache   Patient presents for chest pain.  Medical history includes COPD, CAD, HTN, HLD, atrial fibrillation, DM, CVA, anxiety, depression.  His cardiologist is Dr. Debera.  He had a negative stress test in June of last year.  He underwent coronary CT a year ago which showed high calcium  score.  This morning, he woke up with a mild central chest pain.  This pain was persistent throughout the day.  Approximately 2 hours after he had eaten dinner, pain worsened.  He took 4 baby aspirin .  He developed pain radiating up the left side of his neck and into his head.  The symptoms have been ongoing.  Patient describes worsened pain with deep inspiration.  He does continue to take Xarelto .     Prior to Admission medications   Medication Sig Start Date End Date Taking? Authorizing Provider  acetaminophen  (TYLENOL ) 325 MG tablet Take 650 mg by mouth every 6 (six) hours as needed for moderate pain (pain score 4-6).    [provider]  albuterol  (VENTOLIN  HFA) 108 (90 Base) MCG/ACT inhaler INHALE 2 PUFFS BY MOUTH EVERY 6 HOURS AS NEEDED FOR COUGHING, WHEEZING, OR SHORTNESS OF BREATH 05/26/24   Ruthell Lauraine FALCON, NP  ALPRAZolam  (XANAX ) 0.5 MG tablet Take one twice a day and two at bedtime 05/16/24   Okey Barnie SAUNDERS, MD  amLODipine  (NORVASC ) 10 MG tablet Take 1 tablet (10 mg total) by mouth daily. 02/03/24   Zhao, Xika, NP  budesonide -formoterol  (SYMBICORT ) 160-4.5 MCG/ACT inhaler Take 2 puffs first thing in am and then another 2 puffs about 12 hours later. 01/14/24   Darlean Ozell NOVAK, MD  chlorhexidine  (PERIDEX ) 0.12 % solution Use as directed 15 mLs in the mouth or throat 2 (two) times  daily. 05/30/24   Stuart Vernell Norris, PA-C  EPINEPHrine  0.3 mg/0.3 mL IJ SOAJ injection Inject 0.3 mg into the muscle as needed for anaphylaxis. 09/24/23   Tobie Arleta SQUIBB, MD  esomeprazole  (NEXIUM ) 40 MG capsule Take 1 capsule (40 mg total) by mouth 2 (two) times daily before a meal. 04/08/24   Shirlean Therisa ORN, NP  lidocaine  (XYLOCAINE ) 2 % solution Use as directed 15 mLs in the mouth or throat every 3 (three) hours as needed for mouth pain. 01/15/24   Chandra Harlene LABOR, NP  lidocaine  (XYLOCAINE ) 2 % solution Use as directed 10 mLs in the mouth or throat every 3 (three) hours as needed. 05/30/24   Stuart Vernell Norris, PA-C  linaclotide  (LINZESS ) 72 MCG capsule Take 1 capsule (72 mcg total) by mouth daily before breakfast for 12 days. 05/12/24 06/09/24  Ezzard Sonny RAMAN, PA-C  metoprolol  tartrate (LOPRESSOR ) 25 MG tablet Take 12.5 mg by mouth 2 (two) times daily.    [provider]  nitroGLYCERIN  (NITROSTAT ) 0.4 MG SL tablet Place 1 tablet (0.4 mg total) under the tongue every 5 (five) minutes x 3 doses as needed for chest pain (if no relief after 3rd dose, proceed to the ED for an evaluation). 05/29/24   Strader, Brittany M, PA-C  potassium chloride  SA (KLOR-CON  M20) 20 MEQ tablet Take 1 tablet (20 mEq total) by mouth daily. 04/29/24   Dunn, Dayna N, PA-C  rivaroxaban  (XARELTO ) 20 MG TABS tablet TAKE 1 TABLET BY MOUTH ONCE DAILY WITH SUPPER 04/18/24   Debera Jayson MATSU, MD  simvastatin  (ZOCOR ) 20 MG tablet Take 1 tablet by mouth once daily 06/22/23   Waddell Danelle ORN, MD  Spacer/Aero-Holding Chambers (AEROCHAMBER PLUS) Device To be used as directed with inhaler for better technique and greater benefit 05/13/24   Ruthell Lauraine FALCON, NP  traMADol  (ULTRAM ) 50 MG tablet Take 1 tablet (50 mg total) by mouth every 12 (twelve) hours as needed. 04/22/24   Leath-Warren, Etta PARAS, NP  vitamin B-12 (CYANOCOBALAMIN ) 50 MCG tablet Take 50 mcg by mouth daily.    [provider]  VITAMIN D  PO Take 1  tablet by mouth daily.    [provider]    Allergies: Alpha-gal, Dexilant  [dexlansoprazole ], Mushroom ext cmplx(shiitake-reishi-mait), Penicillins, Clindamycin /lincomycin, and Doxycycline    Review of Systems  Cardiovascular:  Positive for chest pain.  Musculoskeletal:  Positive for neck pain.  Neurological:  Positive for headaches.  All other systems reviewed and are negative.   Updated Vital Signs BP 130/66 (BP Location: Right Arm)   Pulse (!) 57   Temp 97.9 F (36.6 C) (Oral)   Resp 18   Ht 6' (1.829 m)   Wt 76.2 kg   SpO2 100%   BMI 22.78 kg/m   Physical Exam Vitals and nursing note reviewed.  Constitutional:      General: He is not in acute distress.    Appearance: He is well-developed. He is not ill-appearing, toxic-appearing or diaphoretic.  HENT:     Head: Normocephalic and atraumatic.  Eyes:     Conjunctiva/sclera: Conjunctivae normal.  Cardiovascular:     Rate and Rhythm: Normal rate and regular rhythm.     Heart sounds: No murmur heard. Pulmonary:     Effort: Pulmonary effort is normal. No respiratory distress.     Breath sounds: Normal breath sounds. No decreased breath sounds, wheezing, rhonchi or rales.  Chest:     Chest wall: Tenderness present.  Abdominal:     Palpations: Abdomen is soft.     Tenderness: There is no abdominal tenderness.  Musculoskeletal:        General: No swelling. Normal range of motion.     Cervical back: Normal range of motion and neck supple.  Skin:    General: Skin is warm and dry.     Coloration: Skin is not cyanotic or pale.  Neurological:     General: No focal deficit present.     Mental Status: He is alert and oriented to person, place, and time.  Psychiatric:        Mood and Affect: Mood normal.        Behavior: Behavior normal.     (all labs ordered are listed, but only abnormal results are displayed) Labs Reviewed  BASIC METABOLIC PANEL WITH GFR  CBC  PROTIME-INR  TROPONIN T, HIGH SENSITIVITY     EKG: None  Radiology: No results found.  {Document cardiac monitor, telemetry assessment procedure when appropriate:32947} Procedures   Medications Ordered in the ED - No data to display    {Click here for ABCD2, HEART and other calculators REFRESH Note before signing:1}                              Medical Decision Making Amount and/or Complexity of Data Reviewed Labs: ordered. Radiology: ordered.  Risk Prescription drug management.   This patient presents  to the ED for concern of ***, this involves an extensive number of treatment options, and is a complaint that carries with it a high risk of complications and morbidity.  The differential diagnosis includes ***   Co morbidities / Chronic conditions that complicate the patient evaluation  ***   Additional history obtained:  Additional history obtained from EMR External records from outside source obtained and reviewed including ***   Lab Tests:  I Ordered, and personally interpreted labs.  The pertinent results include:  ***   Imaging Studies ordered:  I ordered imaging studies including ***  I independently visualized and interpreted imaging which showed *** I agree with the radiologist interpretation   Cardiac Monitoring: / EKG:  The patient was maintained on a cardiac monitor.  I personally viewed and interpreted the cardiac monitored which showed an underlying rhythm of: ***   Problem List / ED Course / Critical interventions / Medication management  Patient presenting for central chest pain.  Onset was this morning but pain worsened this evening.  On arrival in the ED, vital signs are normal.  On exam, patient appears uncomfortable.  He does have some tenderness to firm palpation.  His current breathing is unlabored.  Lungs are clear to auscultation.  He has no focal neurologic deficits.  Dose of Dilaudid  was ordered for analgesia.  Workup was initiated.*** I ordered medication including ***    Reevaluation of the patient after these medicines showed that the patient *** I have reviewed the patients home medicines and have made adjustments as needed   Consultations Obtained:  I requested consultation with the ***,  and discussed lab and imaging findings as well as pertinent plan - they recommend: ***   Social Determinants of Health:  ***   Test / Admission - Considered:  ***   {Document critical care time when appropriate  Document review of labs and clinical decision tools ie CHADS2VASC2, etc  Document your independent review of radiology images and any outside records  Document your discussion with family members, caretakers and with consultants  Document social determinants of health affecting pt's care  Document your decision making why or why not admission, treatments were needed:32947:::1}   Final diagnoses:  None    ED Discharge Orders     None

## 2024-06-21 NOTE — ED Notes (Signed)
 Pt endorses taking liquid Clindamycin   with the last dose being this Thursday and the last time he had this medication it caused him to have a lot of burning and discomfort in his chest.

## 2024-06-22 ENCOUNTER — Emergency Department (HOSPITAL_COMMUNITY)

## 2024-06-22 DIAGNOSIS — I2510 Atherosclerotic heart disease of native coronary artery without angina pectoris: Secondary | ICD-10-CM | POA: Diagnosis not present

## 2024-06-22 LAB — TROPONIN T, HIGH SENSITIVITY: Troponin T High Sensitivity: <15 ng/L (ref 0–19)

## 2024-06-22 MED ORDER — IOHEXOL 350 MG/ML SOLN
75.0000 mL | Freq: Once | INTRAVENOUS | Status: AC | PRN
  Administered 2024-06-22: 75 mL via INTRAVENOUS

## 2024-06-22 MED ORDER — ALUM & MAG HYDROXIDE-SIMETH 200-200-20 MG/5ML PO SUSP
30.0000 mL | Freq: Once | ORAL | Status: AC
  Administered 2024-06-22: 30 mL via ORAL
  Filled 2024-06-22: qty 30

## 2024-06-22 MED ORDER — LIDOCAINE VISCOUS HCL 2 % MT SOLN
15.0000 mL | Freq: Once | OROMUCOSAL | Status: AC
  Administered 2024-06-22: 15 mL via ORAL
  Filled 2024-06-22: qty 15

## 2024-06-22 NOTE — ED Provider Notes (Signed)
 12:28 AM Assumed care from Dr. Melvenia, please see their note for full history, physical and decision making until this point. In brief this is a 58 y.o. year old male who presented to the ED tonight with Chest Pain     Burning chest pain in the cold. Pending second troponin and CTA for disposition.   CTA negative for PE or any other acute pathology and does have some pulmonary nodules he states his pulmonologist is aware of him but will follow-up with them.  Second troponin undetectable.  On further talking the patient does not like he probably has some dysphagia and that could be related to his symptoms.  I will see any indication for further workup, hospitalization or treatment at this time.  Will need to follow-up with his GI doctor for EGD.  Discharge instructions, including strict return precautions for new or worsening symptoms, given. Patient and/or family verbalized understanding and agreement with the plan as described.   Labs, studies and imaging reviewed by myself and considered in medical decision making if ordered. Imaging interpreted by radiology.  Labs Reviewed  BASIC METABOLIC PANEL WITH GFR - Abnormal; Notable for the following components:      Result Value   Glucose, Bld 144 (*)    All other components within normal limits  HEPATIC FUNCTION PANEL - Abnormal; Notable for the following components:   AST 14 (*)    Indirect Bilirubin 0.1 (*)    All other components within normal limits  CBC  PROTIME-INR  LIPASE, BLOOD  TROPONIN T, HIGH SENSITIVITY  TROPONIN T, HIGH SENSITIVITY    DG Chest 2 View  Final Result    CT Angio Chest PE W and/or Wo Contrast    (Results Pending)    No follow-ups on file.    Lorette Mayo, MD 06/22/24 [phone]

## 2024-06-23 ENCOUNTER — Telehealth: Payer: Self-pay

## 2024-06-23 ENCOUNTER — Telehealth: Payer: Self-pay | Admitting: Cardiology

## 2024-06-23 NOTE — Telephone Encounter (Signed)
 Copied from CRM [id number]. Topic: Clinical - Medical Advice >> Jun 23, 2024 12:12 PM Nathanel BROCKS wrote: Reason for CRM: pt was discharged from ER on Saturday night. Nothing available until mid January, Can you please call pt and see if you can fit them in to an appt closer.    ----------------------------------------------------------------------- From previous Reason for Contact - Scheduling: Patient/patient representative is calling to schedule an appointment. Refer to attachments for appointment information.

## 2024-06-23 NOTE — Telephone Encounter (Signed)
 Copied from CRM [id number]. Topic: Clinical - Medication Question >> Jun 23, 2024 10:07 AM Rozanna MATSU wrote: Reason for CRM: Pt spouse has questions about his next scheduled CT, she has some other questions about results and if he needs to be on some antibiotic please reach out to pt or spouse.   Pt went to ER and had ct scan in the hosp. Does pt still need to have the one completed in jan. Also asked if he needs to be on antibox for the nodules (has mold in house)

## 2024-06-23 NOTE — Telephone Encounter (Signed)
 No need for antibiotics. They need to fix the mold problem at home. As far as the CT scan, they don't need to do the one in January. Just come see me in February and we will decide on subsequent scans.

## 2024-06-23 NOTE — Telephone Encounter (Signed)
 Pt needs ct scan 1/30 cancelled and f/u in FEB

## 2024-06-23 NOTE — Telephone Encounter (Signed)
 Calling discuss his ER visit on Saturday night. States that his potassium was low 3.7. Please advise

## 2024-06-23 NOTE — Telephone Encounter (Signed)
   I do not think his symptoms were due to his K+ of 3.7 by review of records. His cardiac enzymes were normal and he only had an isolated PVC on his EKG. No evidence of a PE. The ER provider thought symptoms were due to dysphagia and recommended GI evaluation with likely EGD. Would continue on K-dur and increase potassium intake in his diet.   Signed, Laymon CHRISTELLA Qua, PA-C 06/23/2024, 9:11 PM Pager: [phone]

## 2024-06-23 NOTE — Telephone Encounter (Signed)
 See ED note, please advise

## 2024-06-24 ENCOUNTER — Ambulatory Visit: Admitting: Internal Medicine

## 2024-06-24 ENCOUNTER — Encounter: Payer: Self-pay | Admitting: Internal Medicine

## 2024-06-24 VITALS — BP 125/78 | HR 63 | Ht 72.0 in | Wt 164.6 lb

## 2024-06-24 DIAGNOSIS — R8290 Unspecified abnormal findings in urine: Secondary | ICD-10-CM | POA: Insufficient documentation

## 2024-06-24 DIAGNOSIS — K219 Gastro-esophageal reflux disease without esophagitis: Secondary | ICD-10-CM

## 2024-06-24 DIAGNOSIS — J449 Chronic obstructive pulmonary disease, unspecified: Secondary | ICD-10-CM | POA: Diagnosis not present

## 2024-06-24 DIAGNOSIS — R079 Chest pain, unspecified: Secondary | ICD-10-CM | POA: Diagnosis not present

## 2024-06-24 DIAGNOSIS — Z09 Encounter for follow-up examination after completed treatment for conditions other than malignant neoplasm: Secondary | ICD-10-CM | POA: Diagnosis not present

## 2024-06-24 DIAGNOSIS — I480 Paroxysmal atrial fibrillation: Secondary | ICD-10-CM

## 2024-06-24 DIAGNOSIS — E876 Hypokalemia: Secondary | ICD-10-CM

## 2024-06-24 MED ORDER — SUCRALFATE 1 G PO TABS: 1.0000 g | ORAL_TABLET | Freq: Three times a day (TID) | ORAL | 0 refills | Status: AC

## 2024-06-24 NOTE — Assessment & Plan Note (Signed)
 Usually well-controlled with Symbicort  and as needed albuterol  Needs to cut down -> quit smoking

## 2024-06-24 NOTE — Progress Notes (Signed)
 Established Patient Office Visit  Subjective:  Patient ID: Richard [name], male    DOB: 1966/04/08  Age: 58 y.o. MRN: [id number]  CC:  Chief Complaint  Patient presents with   Follow-up    ER F/U, reports feeling weak and tired. Concerned about his pottasium.   Gastroesophageal Reflux    Reports sx of acid reflux, takes nexium  doesn't help    HPI Richard [name] is a 58 y.o. male with past medical history of HTN, paroxysmal A-fib, COPD, pulmonary nodules, GERD, type II DM and GAD who presents for f/u of recent ER visit on 06/21/24.  He had episode of chest pain, which he describes as chest tightness 2 hours after he had eaten dinner.  His pain was radiating towards the left side of the sternal border and up into the neck.  He tried taking 4 baby aspirin .  His EKG was negative for any acute ischemic changes.  Serial troponin were negative as well.  He was told of GERD.  He currently takes Nexium  40 mg BID for GERD, but reports acid reflux symptoms despite taking it.  Denies any recent change in the diet.  He has actually quit soft drinks.  He uses Symbicort  regularly and albuterol  as needed for dyspnea or wheezing.  Denies any recent worsening of cough or wheezing.  He also reports feeling weak and attributes it to his potassium being below 4.  He currently takes Klor-Con  20 mEq once daily.  Denies any recent episode of vomiting or diarrhea.  He also reports dark and foul-smelling urine for the last 1 week.  Denies dysuria, hematuria, fever or chills.  Past Medical History:  Diagnosis Date   Arthritis    Asthma    CAD (coronary artery disease)    a. Cardiac cath 07/2015 showed 65% distal Cx, 20% mid-distal LAD, 20% prox-distal RCA, EF 60%, EDP .   Colitis 1990   COPD (chronic obstructive pulmonary disease) (HCC)    Depression    Dysrhythmia    Essential hypertension    Fatty liver    Gastric ulcer 2003; 2012   2003: + esophagitis; negative H.pylori serology  2012: Dr. Harvey,  mild gastritis, Bravo PH probe placement, negative H.pylori   GERD (gastroesophageal reflux disease)    Hepatic steatosis    History of hiatal hernia    Hyperlipemia    Overweight    Panic attacks    Paroxysmal atrial fibrillation (HCC)    Pulmonary nodules    Stroke (HCC)    TIA (transient ischemic attack)    Type II diabetes mellitus (HCC)     Past Surgical History:  Procedure Laterality Date   BALLOON DILATION N/A 07/25/2021   Procedure: BALLOON DILATION;  Surgeon: Cindie Carlin POUR, DO;  [facility]: [facility];  Service: Endoscopy;  Laterality: N/A;   BIOPSY  07/25/2021   Procedure: BIOPSY;  Surgeon: Cindie Carlin POUR, DO;  [facility];  Service: Endoscopy;;   BIOPSY  12/04/2022   Procedure: BIOPSY;  Surgeon: Cindie Carlin POUR, DO;  [facility]: [facility];  Service: Endoscopy;;   BRAVO PH STUDY  05/03/2011   DOQ:Fpoi gastritis/normal esophagus and duodenum   CARDIAC CATHETERIZATION  1990s X 1; 2005; 08/12/2015   CARDIAC CATHETERIZATION N/A 08/12/2015   Procedure: Left Heart Cath and Coronary Angiography;  Surgeon: Victory LELON Sharps, MD; LAD 20%, CFX 65%, RCA 20%, EF 60%    COLONOSCOPY  1990   COLONOSCOPY WITH PROPOFOL  N/A 11/21/2016   Dr. Harvey:  non-thrombosed external hemorrhoids, one 6 mm polyp (polypoid lesion), internal hemorrhoids. TI Normal. 10 years screening   ESOPHAGOGASTRODUODENOSCOPY  05/03/2011   DOQ:fpoi gastritis   ESOPHAGOGASTRODUODENOSCOPY (EGD) WITH PROPOFOL  N/A 07/25/2021   Procedure: ESOPHAGOGASTRODUODENOSCOPY (EGD) WITH PROPOFOL ;  Surgeon: Cindie Carlin POUR, DO;  [facility]: [facility];  Service: Endoscopy;  Laterality: N/A;  1:30pm   ESOPHAGOGASTRODUODENOSCOPY (EGD) WITH PROPOFOL  N/A 12/04/2022   Procedure: ESOPHAGOGASTRODUODENOSCOPY (EGD) WITH PROPOFOL ;  Surgeon: Cindie Carlin POUR, DO;  [facility]: [facility];  Service: Endoscopy;  Laterality: N/A;  8:00AM;ASA 3   NECK MASS EXCISION Right    done in dr's office; behind right ear/side of ncek    POLYPECTOMY  11/21/2016   Procedure: POLYPECTOMY;  Surgeon: Harvey Margo CROME, MD;  Location: [facility];  Service: Endoscopy;;  descending colon polyp   SHOULDER ARTHROSCOPY W/ ROTATOR CUFF REPAIR Right 2006   acromioclavicular joint arthrosis    Family History  Problem Relation Age of Onset   Lung cancer Mother    Alcohol abuse Mother    Heart attack Father 68   Diabetes Father    Alcohol abuse Father    Asthma Sister    Anxiety disorder Sister    Depression Sister    Anxiety disorder Sister    Hypertension Brother    Hypertension Brother    Heart attack Brother 53   Diabetes Brother    Hypertension Brother    Seizures Brother    Dementia Paternal Uncle    ADD / ADHD Daughter    Dementia Cousin    Colon cancer Neg Hx    Drug abuse Neg Hx    Bipolar disorder Neg Hx    OCD Neg Hx    Paranoid behavior Neg Hx    Schizophrenia Neg Hx    Sexual abuse Neg Hx    Physical abuse Neg Hx     Social History   Socioeconomic History   Marital status: Married    Spouse name: Not on file   Number of children: Not on file   Years of education: Not on file   Highest education level: Not on file  Occupational History   Occupation: full time    Employer: UNEMPLOYED  Tobacco Use   Smoking status: Every Day    Current packs/day: 0.50    Average packs/day: 1 pack/day for 41.9 years (40.2 ttl pk-yrs)    Types: Cigarettes    Start date: 07/17/1982   Smokeless tobacco: Never   Tobacco comments:    1/2 pack a day 05/13/2024 KRD    1 pack lasts pt 3 days   Vaping Use   Vaping status: Never Used  Substance and Sexual Activity   Alcohol use: Not Currently   Drug use: No   Sexual activity: Yes    Birth control/protection: None  Other Topics Concern   Not on file  Social History Narrative   Pt lives in [place] with wife.  5 children.  Unemployed due to panic attacks and back pain   Social Drivers of Health   Financial Resource Strain: Not on file  Food Insecurity: No Food  Insecurity (04/15/2024)   Hunger Vital Sign    Worried About Running Out of Food in the Last Year: Never true    Ran Out of Food in the Last Year: Never true  Transportation Needs: No Transportation Needs (04/15/2024)   PRAPARE - Administrator, Civil Service (Medical): No    Lack of Transportation (Non-Medical): No  Physical Activity: Not on file  Stress: Not on file  Social Connections: Not on file  Intimate Partner Violence: Not At Risk (04/15/2024)   Humiliation, Afraid, Rape, and Kick questionnaire    Fear of Current or Ex-Partner: No    Emotionally Abused: No    Physically Abused: No    Sexually Abused: No    Outpatient Medications Prior to Visit  Medication Sig Dispense Refill   acetaminophen  (TYLENOL ) 325 MG tablet Take 650 mg by mouth every 6 (six) hours as needed for moderate pain (pain score 4-6).     albuterol  (VENTOLIN  HFA) 108 (90 Base) MCG/ACT inhaler INHALE 2 PUFFS BY MOUTH EVERY 6 HOURS AS NEEDED FOR COUGHING, WHEEZING, OR SHORTNESS OF BREATH 20.1 g 0   ALPRAZolam  (XANAX ) 0.5 MG tablet Take one twice a day and two at bedtime 120 tablet 2   amLODipine  (NORVASC ) 10 MG tablet Take 1 tablet (10 mg total) by mouth daily. 90 tablet 1   budesonide -formoterol  (SYMBICORT ) 160-4.5 MCG/ACT inhaler Take 2 puffs first thing in am and then another 2 puffs about 12 hours later. 1 each 12   chlorhexidine  (PERIDEX ) 0.12 % solution Use as directed 15 mLs in the mouth or throat 2 (two) times daily. 120 mL 0   EPINEPHrine  0.3 mg/0.3 mL IJ SOAJ injection Inject 0.3 mg into the muscle as needed for anaphylaxis. 2 each 1   esomeprazole  (NEXIUM ) 40 MG capsule Take 1 capsule (40 mg total) by mouth 2 (two) times daily before a meal. 180 capsule 3   lidocaine  (XYLOCAINE ) 2 % solution Use as directed 15 mLs in the mouth or throat every 3 (three) hours as needed for mouth pain. 100 mL 0   lidocaine  (XYLOCAINE ) 2 % solution Use as directed 10 mLs in the mouth or throat every 3 (three) hours  as needed. 100 mL 0   linaclotide  (LINZESS ) 72 MCG capsule Take 1 capsule (72 mcg total) by mouth daily before breakfast for 12 days.     metoprolol  tartrate (LOPRESSOR ) 25 MG tablet Take 12.5 mg by mouth 2 (two) times daily.     nitroGLYCERIN  (NITROSTAT ) 0.4 MG SL tablet Place 1 tablet (0.4 mg total) under the tongue every 5 (five) minutes x 3 doses as needed for chest pain (if no relief after 3rd dose, proceed to the ED for an evaluation). 25 tablet 3   potassium chloride  SA (KLOR-CON  M20) 20 MEQ tablet Take 1 tablet (20 mEq total) by mouth daily. 90 tablet 3   rivaroxaban  (XARELTO ) 20 MG TABS tablet TAKE 1 TABLET BY MOUTH ONCE DAILY WITH SUPPER 90 tablet 1   simvastatin  (ZOCOR ) 20 MG tablet Take 1 tablet by mouth once daily 90 tablet 3   Spacer/Aero-Holding Chambers (AEROCHAMBER PLUS) Device To be used as directed with inhaler for better technique and greater benefit 1 each 0   traMADol  (ULTRAM ) 50 MG tablet Take 1 tablet (50 mg total) by mouth every 12 (twelve) hours as needed. 6 tablet 0   vitamin B-12 (CYANOCOBALAMIN ) 50 MCG tablet Take 50 mcg by mouth daily.     VITAMIN D  PO Take 1 tablet by mouth daily.     No facility-administered medications prior to visit.    Allergies  Allergen Reactions   Alpha-Gal Anaphylaxis   Dexilant  [Dexlansoprazole ] Anaphylaxis   Mushroom Ext Cmplx(Shiitake-Reishi-Mait) Anaphylaxis    Rapid heart rate.   Penicillins Anaphylaxis    Immediate rash, facial/tongue/throat swelling, SOB or lightheadedness with hypotension   Clindamycin /Lincomycin  Gastric problems.    Doxycycline Nausea And Vomiting         ROS Review of Systems  Constitutional:  Positive for fatigue. Negative for chills and fever.  HENT:  Negative for congestion and sore throat.   Eyes:  Negative for pain and discharge.  Respiratory:  Negative for cough and shortness of breath.   Cardiovascular:  Positive for palpitations. Negative for leg swelling.  Gastrointestinal:  Negative  for diarrhea, nausea and vomiting.  Endocrine: Negative for polydipsia and polyuria.  Genitourinary:  Negative for dysuria and hematuria.  Musculoskeletal:  Negative for neck pain and neck stiffness.  Skin:  Negative for rash.  Neurological:  Negative for dizziness and weakness.  Psychiatric/Behavioral:  Negative for agitation and behavioral problems. The patient is nervous/anxious.       Objective:    Physical Exam Vitals reviewed.  Constitutional:      General: He is not in acute distress.    Appearance: He is not diaphoretic.  HENT:     Head: Normocephalic and atraumatic.     Nose: Nose normal.     Mouth/Throat:     Mouth: Mucous membranes are moist.     Pharynx: No posterior oropharyngeal erythema.  Eyes:     General: No scleral icterus.    Extraocular Movements: Extraocular movements intact.  Cardiovascular:     Rate and Rhythm: Normal rate and regular rhythm.     Heart sounds: Normal heart sounds. No murmur heard. Pulmonary:     Breath sounds: Normal breath sounds. No wheezing or rales.  Musculoskeletal:     Cervical back: Neck supple. No tenderness.     Right lower leg: No edema.     Left lower leg: No edema.  Skin:    General: Skin is warm.     Findings: No rash.  Neurological:     General: No focal deficit present.     Mental Status: He is alert and oriented to person, place, and time.  Psychiatric:        Mood and Affect: Mood normal.        Behavior: Behavior normal.     BP 125/78   Pulse 63   Ht 6' (1.829 m)   Wt 164 lb 9.6 oz (74.7 kg)   SpO2 99%   BMI 22.32 kg/m  Wt Readings from Last 3 Encounters:  06/24/24 164 lb 9.6 oz (74.7 kg)  06/21/24 168 lb (76.2 kg)  06/09/24 166 lb (75.3 kg)    Lab Results  Component Value Date   TSH 1.810 05/23/2024   Lab Results  Component Value Date   WBC 6.0 06/21/2024   HGB 14.6 06/21/2024   HCT 41.6 06/21/2024   MCV 91.6 06/21/2024   PLT 187 06/21/2024   Lab Results  Component Value Date   NA 139  06/21/2024   K 3.7 06/21/2024   CO2 29 06/21/2024   GLUCOSE 144 (H) 06/21/2024   BUN 14 06/21/2024   CREATININE 0.80 06/21/2024   BILITOT 0.3 06/21/2024   ALKPHOS 58 06/21/2024   AST 14 (L) 06/21/2024   ALT 11 06/21/2024   PROT 7.2 06/21/2024   ALBUMIN 4.7 06/21/2024   CALCIUM  9.2 06/21/2024   ANIONGAP 8 06/21/2024   EGFR 100 05/15/2024   Lab Results  Component Value Date   CHOL 119 01/24/2024   Lab Results  Component Value Date   HDL 39 (L) 01/24/2024   Lab Results  Component Value Date   LDLCALC 63 01/24/2024  Lab Results  Component Value Date   TRIG 90 01/24/2024   Lab Results  Component Value Date   CHOLHDL 3.1 01/24/2024   Lab Results  Component Value Date   HGBA1C 5.8 (H) 01/24/2024      Assessment & Plan:   Problem List Items Addressed This Visit       Cardiovascular and Mediastinum   Paroxysmal atrial fibrillation (HCC)   Rate controlled with metoprolol  12.5 mg BID On Xarelto  for AC Followed by cardiology Recently had cardiac monitor, which showed PAC and PVC only        Respiratory   COPD (chronic obstructive pulmonary disease) (HCC)   Usually well-controlled with Symbicort  and as needed albuterol  Needs to cut down -> quit smoking        Digestive   GERD (gastroesophageal reflux disease) - Primary   Uncontrolled with Nexium  40 mg BID Added sucralfate  1 g 3 times daily with meals and at bedtime Follow-up with GI      Relevant Medications   sucralfate  (CARAFATE ) 1 g tablet     Other   Hypokalemia   Currently takes Klor-Con  20 mEq once daily Recent BMP showed K of 3.7, which would be unusual reason for fatigue Due to his history of uncontrolled GERD and dysphagia, would avoid increasing frequency of [place]efkorn for now Advised to take potassium rich food Check BMP and Mg       Relevant Orders   Magnesium    Basic Metabolic Panel (BMET)   Encounter for examination following treatment at hospital   ER chart reviewed, including  blood test Palpitations and chest discomfort could be due to GERD - on Nexium  40 mg BID, followed by GI Panic episode can also mimic similarly, has Xanax  PRN for anxiety      Chest pain   Nonspecific chest pain/epigastric pain Epigastric pain could be due to GERD, continue Nexium  and added sucralfate  Followed by cardiology for history of CAD and paroxysmal A-fib      Foul smelling urine   Check UA with reflex culture Advised to maintain at least 64 ounces of fluid intake in a day      Relevant Orders   UA/M w/rflx Culture, Routine    Meds ordered this encounter  Medications   sucralfate  (CARAFATE ) 1 g tablet    Sig: Take 1 tablet (1 g total) by mouth 4 (four) times daily -  with meals and at bedtime.    Dispense:  60 tablet    Refill:  0    Follow-up: Return if symptoms worsen or fail to improve.    Suzzane MARLA Blanch, MD

## 2024-06-24 NOTE — Assessment & Plan Note (Addendum)
 Currently takes Klor-Con  20 mEq once daily Recent BMP showed K of 3.7, which would be unusual reason for fatigue Due to his history of uncontrolled GERD and dysphagia, would avoid increasing frequency of Kloefkorn for now Advised to take potassium rich food Check BMP and Mg

## 2024-06-24 NOTE — Assessment & Plan Note (Signed)
 ER chart reviewed, including blood test Palpitations and chest discomfort could be due to GERD - on Nexium  40 mg BID, followed by GI Panic episode can also mimic similarly, has Xanax  PRN for anxiety

## 2024-06-24 NOTE — Assessment & Plan Note (Signed)
 Nonspecific chest pain/epigastric pain Epigastric pain could be due to GERD, continue Nexium  and added sucralfate  Followed by cardiology for history of CAD and paroxysmal A-fib

## 2024-06-24 NOTE — Telephone Encounter (Signed)
 Spoke to pt's wife who verbalized understanding. Pt's wife had no further questions or concerns at this time.

## 2024-06-24 NOTE — Telephone Encounter (Signed)
 scheduled

## 2024-06-24 NOTE — Assessment & Plan Note (Signed)
 Rate controlled with metoprolol  12.5 mg BID On Xarelto  for [facility] Followed by cardiology Recently had cardiac monitor, which showed PAC and PVC only

## 2024-06-24 NOTE — Patient Instructions (Signed)
 Please start taking Sucralfate  as prescribed.  Please continue taking Nexium  as prescribed.  Please continue to take medications as prescribed.  Please continue to follow low carb diet and ambulate as tolerated.

## 2024-06-24 NOTE — Assessment & Plan Note (Signed)
 Check UA with reflex culture Advised to maintain at least 64 ounces of fluid intake in a day

## 2024-06-24 NOTE — Assessment & Plan Note (Signed)
 Uncontrolled with Nexium  40 mg BID Added sucralfate  1 g 3 times daily with meals and at bedtime Follow-up with GI

## 2024-06-25 ENCOUNTER — Ambulatory Visit: Payer: Self-pay | Admitting: Internal Medicine

## 2024-06-25 LAB — BASIC METABOLIC PANEL WITH GFR
BUN/Creatinine Ratio: 16 (ref 9–20)
BUN: 14 mg/dL (ref 6–24)
CO2: 25 mmol/L (ref 20–29)
Calcium: 9.2 mg/dL (ref 8.7–10.2)
Chloride: 99 mmol/L (ref 96–106)
Creatinine, Ser: 0.89 mg/dL (ref 0.76–1.27)
Glucose: 125 mg/dL — ABNORMAL HIGH (ref 70–99)
Potassium: 4.3 mmol/L (ref 3.5–5.2)
Sodium: 138 mmol/L (ref 134–144)
eGFR: 99 mL/min/1.73 (ref >59)

## 2024-06-25 LAB — MAGNESIUM: Magnesium: 2.1 mg/dL (ref 1.6–2.3)

## 2024-06-26 ENCOUNTER — Other Ambulatory Visit: Payer: Self-pay

## 2024-06-26 ENCOUNTER — Ambulatory Visit: Admitting: Gastroenterology

## 2024-06-26 ENCOUNTER — Telehealth: Payer: Self-pay | Admitting: *Deleted

## 2024-06-26 ENCOUNTER — Ambulatory Visit: Admitting: Student

## 2024-06-26 VITALS — BP 112/70 | HR 56 | Temp 98.4°F | Ht 72.0 in | Wt 163.4 lb

## 2024-06-26 DIAGNOSIS — K219 Gastro-esophageal reflux disease without esophagitis: Secondary | ICD-10-CM

## 2024-06-26 DIAGNOSIS — R1310 Dysphagia, unspecified: Secondary | ICD-10-CM | POA: Diagnosis not present

## 2024-06-26 DIAGNOSIS — R1013 Epigastric pain: Secondary | ICD-10-CM | POA: Diagnosis not present

## 2024-06-26 MED ORDER — VOQUEZNA 20 MG PO TABS: 1.0000 | ORAL_TABLET | Freq: Every day | ORAL | 5 refills | Status: AC

## 2024-06-26 MED ORDER — VOQUEZNA 20 MG PO TABS: 20.0000 mg | ORAL_TABLET | Freq: Every day | ORAL | Status: DC

## 2024-06-26 NOTE — Progress Notes (Signed)
 Medication Samples have been provided to the patient.  Drug name: [id number]       Strength: 20MG         Qty: 3 BOXES  LOT: [id number]  Exp.Date: 2026-DEC  Dosing instructions: I TAB DAILY WITH OR WITHOUT FOOD  The patient has been instructed regarding the correct time, dose, and frequency of taking this medication, including desired effects and most common side effects.   [name] I [name] 2:26 PM 06/26/2024

## 2024-06-26 NOTE — Progress Notes (Signed)
 Gastroenterology Office Note     Primary Care Physician:  Edman Meade PEDLAR, FNP  Primary Gastroenterologist: Dr. Cindie   Chief Complaint   Chief Complaint  Patient presents with   Follow-up    Follow up on GERD, pt states the GERD is affecting his heart.     History of Present Illness   Richard [name] is a 58 y.o. male presenting today with a history of chronic GERD, abdominal pain, dysphagia, constipation, weight loss, esophageal dysmotility but unable to complete manometry due to transportation, hepatic steatosis (no fibrosis with ELF 8.58), alpha gal syndrome, last seen in Oct 2025 with epigastric pain and GERD flares. Here for early interval follow-up. Multiple ED visits for chest pain, ruled out cardiac etiology.   When potassium drops below 4, he states his heart is irregular and speeds up, bounds. Will take off on its own.   Had to take round of clindamycin  again and had worsening dysphagia. Having dysphagia with every meal. Liquids without difficulty.   Nexium  BID, feels like not helping as much. No burning. Has soreness in throat left side. Started after having regurgitation. Picking up carafate  when leaving here.   Dexilant  causes throat swelling. Has taken Pepcid  in the past.   Has pain in upper abdomen just below xiphoid process, burning bilateral upper abdomen. Not eating greasy foods, no oils. HIDA 33% in 2023  Most of the episodes are late evening. Feels like he can't burp.     ED 02/05/24:  -central chest pain, radiating up his esophagus -bradycardia, decreased metoprolol  to 6.25 mg daily -wheezing -Chest xray with chronic hyperinflation and bronchial thickening.   ED 03/07/24: -chest pain, lightheaded, sweaty with trying to stand up -Trop neg -CXR chronic hyperinflation   Cardiology 03/25/24: -switched Toprol -XL from 6.25mg  daily to 12.5mg  prn palpitation rather than scheduled.   ED 04/02/24:  -Palpitations/dizziness -labs unremarkable   ED  04/14/24/overnight admission: -fatigue, palpitations, weakness,  -onset of left sided chest pain while with EMS -ACS ruled out -Multaq  d/c'd -metoprolol  12.5mg  BID once heart rate >55 bpm -Zio 2 week monitor   ED 05/04/24 -palpitations, sharp stabbing left chest pain, sob, heartbeat throbbing in neck -Milk 2% to lactose free, 45 minutes later knot in chest/swelling.  ED 12/6 for chest pain     EGD May 2024: abnormal esophageal motility, consistent with presbyesophagus, suspicious changes for Barrett's but negative path, gastritis, normal duodenum. Negative H.pylori.    EGD 07/2021: small hiatal hernia, abnormal esophageal motility, iron pill gastritis, no h.pylori, mucosal variant in duodenum (bx with focal lymphangiectasia, neg for celiac)    Last colonoscopy 2018, 1 benign polyp removed, recommended 10-year recall.    Past Medical History:  Diagnosis Date   Arthritis    Asthma    CAD (coronary artery disease)    a. Cardiac cath 07/2015 showed 65% distal Cx, 20% mid-distal LAD, 20% prox-distal RCA, EF 60%, EDP .   Colitis 1990   COPD (chronic obstructive pulmonary disease) (HCC)    Depression    Dysrhythmia    Essential hypertension    Fatty liver    Gastric ulcer 2003; 2012   2003: + esophagitis; negative H.pylori serology  2012: Dr. Harvey, mild gastritis, Bravo PH probe placement, negative H.pylori   GERD (gastroesophageal reflux disease)    Hepatic steatosis    History of hiatal hernia    Hyperlipemia    Overweight    Panic attacks    Paroxysmal atrial fibrillation [facility])    Pulmonary  nodules    Stroke [facility])    TIA (transient ischemic attack)    Type II diabetes mellitus (HCC)     Past Surgical History:  Procedure Laterality Date   BALLOON DILATION N/A 07/25/2021   Procedure: BALLOON DILATION;  Surgeon: Cindie Carlin POUR, DO;  [facility];  Service: Endoscopy;  Laterality: N/A;   BIOPSY  07/25/2021   Procedure: BIOPSY;  Surgeon: Cindie Carlin POUR,  DO;  [facility];  Service: Endoscopy;;   BIOPSY  12/04/2022   Procedure: BIOPSY;  Surgeon: Cindie Carlin POUR, DO;  [facility];  Service: Endoscopy;;   BRAVO PH STUDY  05/03/2011   DOQ:Fpoi gastritis/normal esophagus and duodenum   CARDIAC CATHETERIZATION  1990s X 1; 2005; 08/12/2015   CARDIAC CATHETERIZATION N/A 08/12/2015   Procedure: Left Heart Cath and Coronary Angiography;  Surgeon: Victory LELON Sharps, MD; LAD 20%, CFX 65%, RCA 20%, EF 60%    COLONOSCOPY  1990   COLONOSCOPY WITH PROPOFOL  N/A 11/21/2016   Dr. Harvey: non-thrombosed external hemorrhoids, one 6 mm polyp (polypoid lesion), internal hemorrhoids. TI Normal. 10 years screening   ESOPHAGOGASTRODUODENOSCOPY  05/03/2011   DOQ:fpoi gastritis   ESOPHAGOGASTRODUODENOSCOPY (EGD) WITH PROPOFOL  N/A 07/25/2021   Procedure: ESOPHAGOGASTRODUODENOSCOPY (EGD) WITH PROPOFOL ;  Surgeon: Cindie Carlin POUR, DO;  [facility];  Service: Endoscopy;  Laterality: N/A;  1:30pm   ESOPHAGOGASTRODUODENOSCOPY (EGD) WITH PROPOFOL  N/A 12/04/2022   Procedure: ESOPHAGOGASTRODUODENOSCOPY (EGD) WITH PROPOFOL ;  Surgeon: Cindie Carlin POUR, DO;  [facility];  Service: Endoscopy;  Laterality: N/A;  8:00AM;ASA 3   NECK MASS EXCISION Right    done in dr's office; behind right ear/side of ncek   POLYPECTOMY  11/21/2016   Procedure: POLYPECTOMY;  Surgeon: Harvey Margo CROME, MD;  [facility];  Service: Endoscopy;;  descending colon polyp   SHOULDER ARTHROSCOPY W/ ROTATOR CUFF REPAIR Right 2006   acromioclavicular joint arthrosis    Current Outpatient Medications  Medication Sig Dispense Refill   acetaminophen  (TYLENOL ) 325 MG tablet Take 650 mg by mouth every 6 (six) hours as needed for moderate pain (pain score 4-6).     albuterol  (VENTOLIN  HFA) 108 (90 Base) MCG/ACT inhaler INHALE 2 PUFFS BY MOUTH EVERY 6 HOURS AS NEEDED FOR COUGHING, WHEEZING, OR SHORTNESS OF BREATH 20.1 g 0   ALPRAZolam  (XANAX ) 0.5 MG tablet Take one  twice a day and two at bedtime 120 tablet 2   amLODipine  (NORVASC ) 10 MG tablet Take 1 tablet (10 mg total) by mouth daily. 90 tablet 1   budesonide -formoterol  (SYMBICORT ) 160-4.5 MCG/ACT inhaler Take 2 puffs first thing in am and then another 2 puffs about 12 hours later. 1 each 12   chlorhexidine  (PERIDEX ) 0.12 % solution Use as directed 15 mLs in the mouth or throat 2 (two) times daily. 120 mL 0   EPINEPHrine  0.3 mg/0.3 mL IJ SOAJ injection Inject 0.3 mg into the muscle as needed for anaphylaxis. 2 each 1   esomeprazole  (NEXIUM ) 40 MG capsule Take 1 capsule (40 mg total) by mouth 2 (two) times daily before a meal. 180 capsule 3   lidocaine  (XYLOCAINE ) 2 % solution Use as directed 15 mLs in the mouth or throat every 3 (three) hours as needed for mouth pain. 100 mL 0   lidocaine  (XYLOCAINE ) 2 % solution Use as directed 10 mLs in the mouth or throat every 3 (three) hours as needed. 100 mL 0   linaclotide  (LINZESS ) 72 MCG capsule Take 1 capsule (72  mcg total) by mouth daily before breakfast for 12 days.     metoprolol  tartrate (LOPRESSOR ) 25 MG tablet Take 12.5 mg by mouth 2 (two) times daily.     nitroGLYCERIN  (NITROSTAT ) 0.4 MG SL tablet Place 1 tablet (0.4 mg total) under the tongue every 5 (five) minutes x 3 doses as needed for chest pain (if no relief after 3rd dose, proceed to the ED for an evaluation). 25 tablet 3   potassium chloride  SA (KLOR-CON  M20) 20 MEQ tablet Take 1 tablet (20 mEq total) by mouth daily. 90 tablet 3   rivaroxaban  (XARELTO ) 20 MG TABS tablet TAKE 1 TABLET BY MOUTH ONCE DAILY WITH SUPPER 90 tablet 1   simvastatin  (ZOCOR ) 20 MG tablet Take 1 tablet by mouth once daily 90 tablet 3   Spacer/Aero-Holding Chambers (AEROCHAMBER PLUS) Device To be used as directed with inhaler for better technique and greater benefit 1 each 0   sucralfate  (CARAFATE ) 1 g tablet Take 1 tablet (1 g total) by mouth 4 (four) times daily -  with meals and at bedtime. 60 tablet 0   traMADol  (ULTRAM ) 50  MG tablet Take 1 tablet (50 mg total) by mouth every 12 (twelve) hours as needed. 6 tablet 0   vitamin B-12 (CYANOCOBALAMIN ) 50 MCG tablet Take 50 mcg by mouth daily.     VITAMIN D  PO Take 1 tablet by mouth daily.     No current facility-administered medications for this visit.    Allergies as of 06/26/2024 - Review Complete 06/26/2024  Allergen Reaction Noted   Alpha-gal Anaphylaxis 12/14/2022   Dexilant  [dexlansoprazole ] Anaphylaxis 01/20/2015   Mushroom ext cmplx(shiitake-reishi-mait) Anaphylaxis 03/22/2011   Penicillins Anaphylaxis    Clindamycin /lincomycin  05/30/2024   Doxycycline Nausea And Vomiting 04/18/2016    Family History  Problem Relation Age of Onset   Lung cancer Mother    Alcohol abuse Mother    Heart attack Father 46   Diabetes Father    Alcohol abuse Father    Asthma Sister    Anxiety disorder Sister    Depression Sister    Anxiety disorder Sister    Hypertension Brother    Hypertension Brother    Heart attack Brother 66   Diabetes Brother    Hypertension Brother    Seizures Brother    Dementia Paternal Uncle    ADD / ADHD Daughter    Dementia Cousin    Colon cancer Neg Hx    Drug abuse Neg Hx    Bipolar disorder Neg Hx    OCD Neg Hx    Paranoid behavior Neg Hx    Schizophrenia Neg Hx    Sexual abuse Neg Hx    Physical abuse Neg Hx     Social History   Socioeconomic History   Marital status: Married    Spouse name: Not on file   Number of children: Not on file   Years of education: Not on file   Highest education level: Not on file  Occupational History   Occupation: full time    Employer: UNEMPLOYED  Tobacco Use   Smoking status: Every Day    Current packs/day: 0.50    Average packs/day: 1 pack/day for 41.9 years (40.2 ttl pk-yrs)    Types: Cigarettes    Start date: 07/17/1982   Smokeless tobacco: Never   Tobacco comments:    1/2 pack a day 05/13/2024 KRD    1 pack lasts pt 3 days   Vaping Use   Vaping status: Never Used  Substance and Sexual Activity   Alcohol use: Not Currently   Drug use: No   Sexual activity: Yes    Birth control/protection: None  Other Topics Concern   Not on file  Social History Narrative   Pt lives in [place] with wife.  5 children.  Unemployed due to panic attacks and back pain   Social Drivers of Health   Tobacco Use: High Risk (06/24/2024)   Patient History    Smoking Tobacco Use: Every Day    Smokeless Tobacco Use: Never    Passive Exposure: Not on file  Financial Resource Strain: Not on file  Food Insecurity: No Food Insecurity (04/15/2024)   Epic    Worried About Programme Researcher, Broadcasting/film/video in the Last Year: Never true    Ran Out of Food in the Last Year: Never true  Transportation Needs: No Transportation Needs (04/15/2024)   Epic    Lack of Transportation (Medical): No    Lack of Transportation (Non-Medical): No  Physical Activity: Not on file  Stress: Not on file  Social Connections: Not on file  Intimate Partner Violence: Not At Risk (04/15/2024)   Epic    Fear of Current or Ex-Partner: No    Emotionally Abused: No    Physically Abused: No    Sexually Abused: No  Depression (PHQ2-9): Low Risk (06/24/2024)   Depression (PHQ2-9)    PHQ-2 Score: 0  Recent Concern: Depression (PHQ2-9) - Medium Risk (05/23/2024)   Depression (PHQ2-9)    PHQ-2 Score: 6  Alcohol Screen: Not on file  Housing: Low Risk (04/15/2024)   Epic    Unable to Pay for Housing in the Last Year: No    Number of Times Moved in the Last Year: 0    Homeless in the Last Year: No  Utilities: Not At Risk (04/15/2024)   Epic    Threatened with loss of utilities: No  Health Literacy: Not on file     Review of Systems   See HPI   Physical Exam   BP 112/70   Pulse (!) 56   Temp 98.4 F (36.9 C)   Ht 6' (1.829 m)   Wt 163 lb 6.4 oz (74.1 kg)   BMI 22.16 kg/m  General:   Alert and oriented. Pleasant and cooperative. Well-nourished and well-developed.  Head:  Normocephalic and  atraumatic. Eyes:  Without icterus Abdomen:  +BS, soft, non-tender and non-distended. No HSM noted. No guarding or rebound. No masses appreciated.  Rectal:  Deferred  Msk:  Symmetrical without gross deformities. Normal posture. Extremities:  Without edema. Neurologic:  Alert and  oriented x4;  grossly normal neurologically. Skin:  Intact without significant lesions or rashes. Psych:  Alert and cooperative. Normal mood and affect.  Lab Results  Component Value Date   ALT 11 06/21/2024   AST 14 (L) 06/21/2024   ALKPHOS 58 06/21/2024   BILITOT 0.3 06/21/2024   Lab Results  Component Value Date   WBC 6.0 06/21/2024   HGB 14.6 06/21/2024   HCT 41.6 06/21/2024   MCV 91.6 06/21/2024   PLT 187 06/21/2024   Lab Results  Component Value Date   NA 138 06/24/2024   CL 99 06/24/2024   K 4.3 06/24/2024   CO2 25 06/24/2024   BUN 14 06/24/2024   CREATININE 0.89 06/24/2024   EGFR 99 06/24/2024   CALCIUM  9.2 06/24/2024   PHOS 4.9 (H) 04/15/2024   ALBUMIN 4.7 06/21/2024   GLUCOSE 125 (H) 06/24/2024  Assessment   GERD, dyspepsia worsening, suspect component of pill-induced esophagitis s/p clindamycin , Nexium  not effective  Dysphagia to solid foods, arranging dilation, suspect dysmotility underlying, declining manometry in past due to transportation  Epigastric pain: suspect gastritis, could have esophagitis as culprit as well, with persistent chest discomfort and ruled out cardiac component, need to consider biliary etiology.    PLAN    Trial of Voquezna. Stop Nexium  Requesting to hold Xarelto  X 48 hours prior Proceed with upper endoscopy/dilation by Dr. Cindie in near future: the risks, benefits, and alternatives have been discussed with the patient in detail. The patient states understanding and desires to proceed.  2 month follow-up Consider updated US  for biliary etiology   Therisa MICAEL Stager, PhD, ANP-BC [facility]

## 2024-06-26 NOTE — Telephone Encounter (Signed)
°  Request for patient to stop medication prior to procedure   06/26/2024  Richard [name] 1965/09/14  What type of surgery is being performed? EGD/ESOPHAGEAL DILATION  When is surgery scheduled? TBD  What type of clearance is required (medical or pharmacy to hold medication or both? MEDICATION  Are there any medications that need to be held prior to surgery and how long? XARELTO  X 2 DAYS PRIOR  Name of physician performing surgery?  Dr. Cindie Rouse [facility] at [facility] Phone: [phone], option 5 Fax: [phone]  Anesthesia type (none, local, MAC, general)? MAC

## 2024-06-26 NOTE — Patient Instructions (Addendum)
 Let's stop Nexium .  Instead, start Voquezna one tablet each morning, with or without food.  We are arranging an upper endoscopy with dilation by Dr. Cindie in the near future!  We will see you in 2 months!  I hope you have a good holiday season!  I enjoyed seeing you again today! I value our relationship and want to provide genuine, compassionate, and quality care. You may receive a survey regarding your visit with me, and I welcome your feedback! Thanks so much for taking the time to complete this. I look forward to seeing you again.      Therisa MICAEL Stager, PhD, ANP-BC [facility]

## 2024-06-27 DIAGNOSIS — Z419 Encounter for procedure for purposes other than remedying health state, unspecified: Secondary | ICD-10-CM | POA: Diagnosis not present

## 2024-06-27 LAB — UA/M W/RFLX CULTURE, ROUTINE
Glucose, UA: NEGATIVE
Nitrite, UA: NEGATIVE
RBC, UA: NEGATIVE
Specific Gravity, UA: 1.027 (ref 1.005–1.030)
Urobilinogen, Ur: 1 mg/dL (ref 0.2–1.0)
pH, UA: 5.5 (ref 5.0–7.5)

## 2024-06-27 LAB — MICROSCOPIC EXAMINATION
Bacteria, UA: NONE SEEN
RBC, Urine: NONE SEEN /HPF (ref 0–2)
WBC, UA: NONE SEEN /HPF (ref 0–5)

## 2024-06-27 LAB — URINE CULTURE, REFLEX

## 2024-06-28 ENCOUNTER — Emergency Department (HOSPITAL_COMMUNITY)

## 2024-06-28 ENCOUNTER — Emergency Department (HOSPITAL_COMMUNITY)
Admission: EM | Admit: 2024-06-28 | Discharge: 2024-06-29 | Disposition: A | Discharge status: Home / Self Care | Attending: Emergency Medicine | Admitting: Emergency Medicine

## 2024-06-28 ENCOUNTER — Other Ambulatory Visit: Payer: Self-pay

## 2024-06-28 ENCOUNTER — Encounter (HOSPITAL_COMMUNITY): Payer: Self-pay

## 2024-06-28 DIAGNOSIS — I2510 Atherosclerotic heart disease of native coronary artery without angina pectoris: Secondary | ICD-10-CM | POA: Insufficient documentation

## 2024-06-28 DIAGNOSIS — Z79899 Other long term (current) drug therapy: Secondary | ICD-10-CM | POA: Diagnosis not present

## 2024-06-28 DIAGNOSIS — Z7901 Long term (current) use of anticoagulants: Secondary | ICD-10-CM | POA: Diagnosis not present

## 2024-06-28 DIAGNOSIS — J449 Chronic obstructive pulmonary disease, unspecified: Secondary | ICD-10-CM | POA: Insufficient documentation

## 2024-06-28 DIAGNOSIS — M79602 Pain in left arm: Secondary | ICD-10-CM | POA: Diagnosis not present

## 2024-06-28 DIAGNOSIS — I10 Essential (primary) hypertension: Secondary | ICD-10-CM | POA: Insufficient documentation

## 2024-06-28 DIAGNOSIS — I4891 Unspecified atrial fibrillation: Secondary | ICD-10-CM | POA: Diagnosis not present

## 2024-06-28 DIAGNOSIS — R079 Chest pain, unspecified: Secondary | ICD-10-CM | POA: Insufficient documentation

## 2024-06-28 DIAGNOSIS — R0789 Other chest pain: Secondary | ICD-10-CM | POA: Diagnosis not present

## 2024-06-28 DIAGNOSIS — Z743 Need for continuous supervision: Secondary | ICD-10-CM | POA: Diagnosis not present

## 2024-06-28 LAB — CBC
HCT: 38.3 % — ABNORMAL LOW (ref 39.0–52.0)
Hemoglobin: 13.5 g/dL (ref 13.0–17.0)
MCH: 32 pg (ref 26.0–34.0)
MCHC: 35.2 g/dL (ref 30.0–36.0)
MCV: 90.8 fL (ref 80.0–100.0)
Platelets: 179 K/uL (ref 150–400)
RBC: 4.22 MIL/uL (ref 4.22–5.81)
RDW: 12 % (ref 11.5–15.5)
WBC: 5.5 K/uL (ref 4.0–10.5)
nRBC: 0 % (ref 0.0–0.2)

## 2024-06-28 NOTE — ED Triage Notes (Signed)
 Pt bib EMS from home with complaint of heart palpitations starting after moving a heavy chair within past hour. Pt also reports chest pain beginning 3 days ago (pressure like and constant per EMS). Hx of afib, not on any medications for it. Pt reports feeling like he was in afib before EMS arrival but calmed himself down prior to EMS arrival. NSR for EMS. Unremarkable EKG, VSS. Aspirin  taken by patient prior to arrival.

## 2024-06-28 NOTE — ED Provider Notes (Incomplete)
 [place] EMERGENCY DEPARTMENT AT [facility] Provider Note   CSN: [id number] Arrival date & time: 06/28/24  2319     Patient presents with: Chest Pain   Richard [name] is a 58 y.o. male.  {Add pertinent medical, surgical, social history, OB history to HPI:32947}  Chest Pain      Prior to Admission medications  Medication Sig Start Date End Date Taking? Authorizing Provider  acetaminophen  (TYLENOL ) 325 MG tablet Take 650 mg by mouth every 6 (six) hours as needed for moderate pain (pain score 4-6).    [provider]  albuterol  (VENTOLIN  HFA) 108 (90 Base) MCG/ACT inhaler INHALE 2 PUFFS BY MOUTH EVERY 6 HOURS AS NEEDED FOR COUGHING, WHEEZING, OR SHORTNESS OF BREATH 05/26/24   Ruthell Lauraine FALCON, NP  ALPRAZolam  (XANAX ) 0.5 MG tablet Take one twice a day and two at bedtime 05/16/24   Okey Barnie SAUNDERS, MD  amLODipine  (NORVASC ) 10 MG tablet Take 1 tablet (10 mg total) by mouth daily. 02/03/24   Zhao, Xika, NP  budesonide -formoterol  (SYMBICORT ) 160-4.5 MCG/ACT inhaler Take 2 puffs first thing in am and then another 2 puffs about 12 hours later. 01/14/24   Darlean Ozell NOVAK, MD  chlorhexidine  (PERIDEX ) 0.12 % solution Use as directed 15 mLs in the mouth or throat 2 (two) times daily. 05/30/24   Stuart Vernell Norris, PA-C  EPINEPHrine  0.3 mg/0.3 mL IJ SOAJ injection Inject 0.3 mg into the muscle as needed for anaphylaxis. 09/24/23   Tobie Arleta SQUIBB, MD  esomeprazole  (NEXIUM ) 40 MG capsule Take 1 capsule (40 mg total) by mouth 2 (two) times daily before a meal. 04/08/24   Shirlean Therisa ORN, NP  lidocaine  (XYLOCAINE ) 2 % solution Use as directed 15 mLs in the mouth or throat every 3 (three) hours as needed for mouth pain. 01/15/24   Chandra Harlene LABOR, NP  lidocaine  (XYLOCAINE ) 2 % solution Use as directed 10 mLs in the mouth or throat every 3 (three) hours as needed. 05/30/24   Stuart Vernell Norris, PA-C  linaclotide  (LINZESS ) 72 MCG capsule Take 1 capsule (72 mcg total) by mouth daily  before breakfast for 12 days. 05/12/24 06/26/24  Ezzard Sonny RAMAN, PA-C  metoprolol  tartrate (LOPRESSOR ) 25 MG tablet Take 12.5 mg by mouth 2 (two) times daily.    [provider]  nitroGLYCERIN  (NITROSTAT ) 0.4 MG SL tablet Place 1 tablet (0.4 mg total) under the tongue every 5 (five) minutes x 3 doses as needed for chest pain (if no relief after 3rd dose, proceed to the ED for an evaluation). 05/29/24   Strader, Brittany M, PA-C  potassium chloride  SA (KLOR-CON  M20) 20 MEQ tablet Take 1 tablet (20 mEq total) by mouth daily. 04/29/24   Dunn, Dayna N, PA-C  rivaroxaban  (XARELTO ) 20 MG TABS tablet TAKE 1 TABLET BY MOUTH ONCE DAILY WITH SUPPER 04/18/24   Debera Jayson MATSU, MD  simvastatin  (ZOCOR ) 20 MG tablet Take 1 tablet by mouth once daily 06/22/23   Waddell Danelle ORN, MD  Spacer/Aero-Holding Chambers (AEROCHAMBER PLUS) Device To be used as directed with inhaler for better technique and greater benefit 05/13/24   Ruthell Lauraine FALCON, NP  sucralfate  (CARAFATE ) 1 g tablet Take 1 tablet (1 g total) by mouth 4 (four) times daily -  with meals and at bedtime. 06/24/24   Tobie Suzzane POUR, MD  traMADol  (ULTRAM ) 50 MG tablet Take 1 tablet (50 mg total) by mouth every 12 (twelve) hours as needed. 04/22/24   Leath-Warren, Etta PARAS, NP  vitamin B-12 (CYANOCOBALAMIN ) 50 MCG tablet Take 50 mcg by mouth daily.    [provider]  VITAMIN D  PO Take 1 tablet by mouth daily.    [provider]  Vonoprazan Fumarate  (VOQUEZNA ) 20 MG TABS Take 1 tablet by mouth daily. 06/26/24   Shirlean Therisa ORN, NP  Vonoprazan Fumarate  (VOQUEZNA ) 20 MG TABS Take 20 mg by mouth daily. 06/26/24   Shirlean Therisa ORN, NP    Allergies: Alpha-gal, Dexilant  [dexlansoprazole ], Mushroom ext cmplx(shiitake-reishi-mait), Penicillins, Clindamycin /lincomycin, and Doxycycline    Review of Systems  Cardiovascular:  Positive for chest pain.    Updated Vital Signs BP 123/67 (BP Location: Right Arm)   Pulse (!) 52   Temp 97.6 F (36.4  C) (Oral)   Resp 18   Ht 6' (1.829 m)   Wt 74.4 kg   SpO2 99%   BMI 22.24 kg/m   Physical Exam  (all labs ordered are listed, but only abnormal results are displayed) Labs Reviewed  BASIC METABOLIC PANEL WITH GFR  CBC  TROPONIN T, HIGH SENSITIVITY    EKG: ED ECG REPORT   Date: 06/28/2024  Rate: 51  Rhythm: sinus bradycardia  QRS Axis: normal  Intervals: normal  ST/T Wave abnormalities: normal  Conduction Disutrbances:none  Narrative Interpretation: Sinus bradycardia and otherwise normal ECG.  When compared with ECG of 06/21/2024, no significant changes are seen.  Old EKG Reviewed: unchanged  I have personally reviewed the EKG tracing and disagree with the computerized printout as noted.  Radiology: No results found.  {Document cardiac monitor, telemetry assessment procedure when appropriate:32947} Procedures   Medications Ordered in the ED - No data to display    {Click here for ABCD2, HEART and other calculators REFRESH Note before signing:1}                              Medical Decision Making Amount and/or Complexity of Data Reviewed Labs: ordered. Radiology: ordered.   ***  {Document critical care time when appropriate  Document review of labs and clinical decision tools ie CHADS2VASC2, etc  Document your independent review of radiology images and any outside records  Document your discussion with family members, caretakers and with consultants  Document social determinants of health affecting pt's care  Document your decision making why or why not admission, treatments were needed:32947:::1}   Final diagnoses:  None    ED Discharge Orders     None

## 2024-06-28 NOTE — ED Provider Notes (Signed)
 [facility] [facility] AT [facility] Provider Note   CSN: [id number] Arrival date & time: 06/28/24  2319     Patient presents with: Chest Pain   Jerold L Luckow is a 58 y.o. male.   The history is provided by the patient.  Chest Pain  He has history of hypertension, diabetes, hyperlipidemia, COPD, coronary artery disease, stroke, paroxysmal atrial fibrillation anticoagulated on rivaroxaban  and comes in because of chest pain and left arm pain.  He states that about 4 days ago, he straightened his left arm out and he felt something pop in his upper arm.  Since then, he has been having an achy pain in the left arm and a tight feeling in the left side of his chest.  Pain has been constant.  He has had episodes where he feels like his heart is racing.  Tonight, he picked up a chair and carried it a short distance and got very short of breath and his heart rate shot up and the developed some nausea and diaphoresis.  He also states that he saw his primary care provider 2 days ago and was told that he was dehydrated because he had ketones in his urine.  He has been eating and drinking normally.  He does smoke and there is a strong family history of premature coronary atherosclerosis.    Prior to Admission medications  Medication Sig Start Date End Date Taking? Authorizing Provider  acetaminophen  (TYLENOL ) 325 MG tablet Take 650 mg by mouth every 6 (six) hours as needed for moderate pain (pain score 4-6).    [provider]  albuterol  (VENTOLIN  HFA) 108 (90 Base) MCG/ACT inhaler INHALE 2 PUFFS BY MOUTH EVERY 6 HOURS AS NEEDED FOR COUGHING, WHEEZING, OR SHORTNESS OF BREATH 05/26/24   Ruthell Lauraine FALCON, NP  ALPRAZolam  (XANAX ) 0.5 MG tablet Take one twice a day and two at bedtime 05/16/24   Okey Barnie SAUNDERS, MD  amLODipine  (NORVASC ) 10 MG tablet Take 1 tablet (10 mg total) by mouth daily. 02/03/24   Zhao, Xika, NP  budesonide -formoterol  (SYMBICORT ) 160-4.5 MCG/ACT inhaler Take 2  puffs first thing in am and then another 2 puffs about 12 hours later. 01/14/24   Darlean Ozell NOVAK, MD  chlorhexidine  (PERIDEX ) 0.12 % solution Use as directed 15 mLs in the mouth or throat 2 (two) times daily. 05/30/24   Stuart Vernell Norris, PA-C  EPINEPHrine  0.3 mg/0.3 mL IJ SOAJ injection Inject 0.3 mg into the muscle as needed for anaphylaxis. 09/24/23   Tobie Arleta SQUIBB, MD  esomeprazole  (NEXIUM ) 40 MG capsule Take 1 capsule (40 mg total) by mouth 2 (two) times daily before a meal. 04/08/24   Shirlean Therisa ORN, NP  lidocaine  (XYLOCAINE ) 2 % solution Use as directed 15 mLs in the mouth or throat every 3 (three) hours as needed for mouth pain. 01/15/24   Chandra Harlene LABOR, NP  lidocaine  (XYLOCAINE ) 2 % solution Use as directed 10 mLs in the mouth or throat every 3 (three) hours as needed. 05/30/24   Stuart Vernell Norris, PA-C  linaclotide  (LINZESS ) 72 MCG capsule Take 1 capsule (72 mcg total) by mouth daily before breakfast for 12 days. 05/12/24 06/26/24  Ezzard Sonny RAMAN, PA-C  metoprolol  tartrate (LOPRESSOR ) 25 MG tablet Take 12.5 mg by mouth 2 (two) times daily.    [provider]  nitroGLYCERIN  (NITROSTAT ) 0.4 MG SL tablet Place 1 tablet (0.4 mg total) under the tongue every 5 (five) minutes x 3 doses as needed for chest  pain (if no relief after 3rd dose, proceed to the ED for an evaluation). 05/29/24   Strader, Laymon HERO, PA-C  potassium chloride  SA (KLOR-CON  M20) 20 MEQ tablet Take 1 tablet (20 mEq total) by mouth daily. 04/29/24   Dunn, Dayna N, PA-C  rivaroxaban  (XARELTO ) 20 MG TABS tablet TAKE 1 TABLET BY MOUTH ONCE DAILY WITH SUPPER 04/18/24   Debera Jayson MATSU, MD  simvastatin  (ZOCOR ) 20 MG tablet Take 1 tablet by mouth once daily 06/22/23   Waddell Danelle ORN, MD  Spacer/Aero-Holding Chambers (AEROCHAMBER PLUS) Device To be used as directed with inhaler for better technique and greater benefit 05/13/24   Ruthell Lauraine FALCON, NP  sucralfate  (CARAFATE ) 1 g tablet Take 1 tablet (1 g total) by  mouth 4 (four) times daily -  with meals and at bedtime. 06/24/24   Tobie Suzzane POUR, MD  traMADol  (ULTRAM ) 50 MG tablet Take 1 tablet (50 mg total) by mouth every 12 (twelve) hours as needed. 04/22/24   Leath-Warren, Etta PARAS, NP  vitamin B-12 (CYANOCOBALAMIN ) 50 MCG tablet Take 50 mcg by mouth daily.    [provider]  VITAMIN D  PO Take 1 tablet by mouth daily.    [provider]  Vonoprazan Fumarate  (VOQUEZNA ) 20 MG TABS Take 1 tablet by mouth daily. 06/26/24   Shirlean Therisa ORN, NP  Vonoprazan Fumarate  (VOQUEZNA ) 20 MG TABS Take 20 mg by mouth daily. 06/26/24   Shirlean Therisa ORN, NP    Allergies: Alpha-gal, Dexilant  [dexlansoprazole ], Mushroom ext cmplx(shiitake-reishi-mait), Penicillins, Clindamycin /lincomycin, and Doxycycline    Review of Systems  Cardiovascular:  Positive for chest pain.  All other systems reviewed and are negative.   Updated Vital Signs BP 123/67 (BP Location: Right Arm)   Pulse (!) 52   Temp 97.6 F (36.4 C) (Oral)   Resp 18   Ht 6' (1.829 m)   Wt 74.4 kg   SpO2 99%   BMI 22.24 kg/m   Physical Exam Vitals and nursing note reviewed.   58 year old male, resting comfortably and in no acute distress. Vital signs are significant for slow heart rate. Oxygen saturation is 99%, which is normal. Head is normocephalic and atraumatic. PERRLA, EOMI. Oropharynx is clear. Neck is nontender and supple without adenopathy or JVD. Back is nontender and there is no CVA tenderness. Lungs are clear without rales, wheezes, or rhonchi. Chest is nontender. Heart has regular rate and rhythm without murmur. Abdomen is soft, flat, nontender. Extremities have no swelling or deformity.  There is no tenderness to palpation.  There is no peripheral edema. Skin is warm and dry without rash. Neurologic: Mental status is normal, cranial nerves are intact, moves all extremities equally.  (all labs ordered are listed, but only abnormal results are displayed) Labs Reviewed   BASIC METABOLIC PANEL WITH GFR - Abnormal; Notable for the following components:      Result Value   Glucose, Bld 116 (*)    Calcium  8.7 (*)    All other components within normal limits  CBC - Abnormal; Notable for the following components:   HCT 38.3 (*)    All other components within normal limits  URINALYSIS, ROUTINE W REFLEX MICROSCOPIC - Abnormal; Notable for the following components:   Color, Urine STRAW (*)    Specific Gravity, Urine >1.046 (*)    All other components within normal limits  TROPONIN T, HIGH SENSITIVITY  TROPONIN T, HIGH SENSITIVITY    EKG: ED ECG REPORT   Date: 06/28/2024  Rate: 51  Rhythm: sinus bradycardia  QRS Axis: normal  Intervals: normal  ST/T Wave abnormalities: normal  Conduction Disutrbances:none  Narrative Interpretation: Sinus bradycardia and otherwise normal ECG.  When compared with ECG of 06/21/2024, no significant changes are seen.  Old EKG Reviewed: unchanged  I have personally reviewed the EKG tracing and disagree with the computerized printout as noted.  Radiology: CT Angio Chest PE W and/or Wo Contrast Result Date: 06/29/2024 EXAM: CTA of the Chest with contrast for PE 06/29/2024 12:52:00 AM TECHNIQUE: CTA of the chest was performed without and with the administration of 75 mL of intravenous iohexol  (OMNIPAQUE ) 350 MG/ML injection. Multiplanar reformatted images are provided for review. MIP images are provided for review. Automated exposure control, iterative reconstruction, and/or weight based adjustment of the mA/kV was utilized to reduce the radiation dose to as low as reasonably achievable. COMPARISON: CTA chest dated 06/22/2024, CT chest dated 05/07/2024, PET CT dated 01/17/2024, and LDCT dated 12/21/2023. CLINICAL HISTORY: Pulmonary embolism (PE) suspected, high prob. FINDINGS: PULMONARY ARTERIES: Pulmonary arteries are adequately opacified for evaluation. No pulmonary embolism. Main pulmonary artery is normal in caliber. MEDIASTINUM:  The heart and pericardium demonstrate no acute abnormality. There is no acute abnormality of the thoracic aorta. LYMPH NODES: No mediastinal, hilar or axillary lymphadenopathy. LUNGS AND PLEURA: Multiple waxing and waning subpleural nodules in the left lower lobe, including a 9 mm irregular nodule adjacent to the left fissure (image 101) and 8 mm nodule along the left hemidiaphragm (image 112). Mild dependent atelectasis in the bilateral lower lobes. No pleural effusion or pneumothorax. UPPER ABDOMEN: Limited images of the upper abdomen are unremarkable. SOFT TISSUES AND BONES: No acute bone or soft tissue abnormality. IMPRESSION: 1. No evidence of pulmonary embolism. 2. Multiple waxing and waning subpleural nodules in the left lower lobe, as above. Infection/inflammation is favored. Consider f/u CT chest in 3-6 months. Electronically signed by: Pinkie Pebbles MD 06/29/2024 01:02 AM EST RP Workstation: [id number]   DG Chest 2 View Result Date: 06/29/2024 EXAM: 2 VIEW(S) XRAY OF THE CHEST 06/28/2024 11:55:30 PM COMPARISON: 06/21/2024 CLINICAL HISTORY: chest pain FINDINGS: LUNGS AND PLEURA: Known parenchymal nodules are not well visualized. No pleural effusion. No pneumothorax. HEART AND MEDIASTINUM: No acute abnormality of the cardiac and mediastinal silhouettes. BONES AND SOFT TISSUES: No acute osseous abnormality. IMPRESSION: 1. No acute cardiopulmonary process. Electronically signed by: Oneil Devonshire MD 06/29/2024 12:01 AM EST RP Workstation: [id number]    Cardiac monitor shows sinus bradycardia with occasional PAC, per my interpretation.  Procedures   Medications Ordered in the ED  nitroGLYCERIN  (NITROSTAT ) SL tablet 0.4 mg (0.4 mg Sublingual Given 06/29/24 0017)  potassium chloride  SA (KLOR-CON  M) CR tablet 40 mEq (40 mEq Oral Given 06/29/24 0016)  iohexol  (OMNIPAQUE ) 350 MG/ML injection 75 mL (75 mLs Intravenous Contrast Given 06/29/24 0047)                                    Medical  Decision Making Amount and/or Complexity of Data Reviewed Labs: ordered. Radiology: ordered.  Risk Prescription drug management.   Chest discomfort of uncertain cause.  This a presentation with wide range of treatment options and carries with a high risk of morbidity and complications.  Differential diagnosis includes, but is not limited to, ACS, muscle strain, pulmonary embolism.  I have reviewed his electrocardiogram, my interpretation is sinus bradycardia and otherwise normal ECG.  I reviewed his past records, and note cardiac catheterization in  2017 showing 65% lesion of the distal circumflex, 20% lesions of the mid distal LAD and proximal to distal RCA.  Coronary CT scan on 05/09/2023 with had positive FFR in distal RCA and also in the LAD.  He had 50-69% calcified plaque in the mid LAD, 25-49% calcified proximal LAD lesion, 50-69% calcified plaque in the mid first diagonal and 25-49% calcified plaque in the ostium of the first diagonal.  Circumflex had 25-49% calcified plaque in the mid/distal vessel and 1-24% calcified plaque at the ostium.  Right coronary artery artery had 25-49% mid vessel occlusion, 50-69% proximal and distal occlusions.  He clearly is at risk for coronary artery disease, but symptoms are quite atypical for coronary disease.  I have reviewed his laboratory tests, and my interpretation is normal CBC except for mildly elevated glucose, normal troponin, normal CBC.  Chest x-ray shows no acute cardiopulmonary process.  Have independently viewed the images, and agree with radiologist's interpretation.  I have explained the patient that his labs today do not show evidence of dehydration but he insist that he is still dehydrated.  I do note urinalysis on 06/24/2024 did had quite trace ketones and specific gravity 1.027.  I have ordered a urinalysis.  I have ordered CT angiogram of the chest to rule out pulmonary embolism, even though he is currently anticoagulated.  I have also ordered a  therapeutic trial of nitroglycerin .  Following nitroglycerin , the tight feeling improved, but was not completely gone.  He continued to have left arm pain.  Repeat troponin is unchanged.  CT angiogram shows no evidence of pulmonary embolism but noted multiple waxing and waning subpleural nodules in the left lower lobe concerning for infection or inflammation.  I am discharging him with prescription for nitroglycerin  and azithromycin  and I am referring him back to his cardiologist for further evaluation of his rapid heart rate with slight exertion.  Return precautions discussed.     Final diagnoses:  Chest discomfort  Pain in left arm  Anticoagulated on rivaroxaban     ED Discharge Orders          Ordered    Ambulatory referral to Cardiology       Comments: If you have not heard from the Cardiology office within the next 72 hours please call [phone].   06/29/24 0201    nitroGLYCERIN  (NITROSTAT ) 0.4 MG SL tablet  Every 5 min x3 PRN        06/29/24 0201    azithromycin  (ZITHROMAX ) 250 MG tablet  Daily        06/29/24 0205               Raford Lenis, MD 06/29/24 [phone]

## 2024-06-29 ENCOUNTER — Emergency Department (HOSPITAL_COMMUNITY)

## 2024-06-29 DIAGNOSIS — J9811 Atelectasis: Secondary | ICD-10-CM | POA: Diagnosis not present

## 2024-06-29 DIAGNOSIS — R911 Solitary pulmonary nodule: Secondary | ICD-10-CM | POA: Diagnosis not present

## 2024-06-29 LAB — URINALYSIS, ROUTINE W REFLEX MICROSCOPIC
Bilirubin Urine: NEGATIVE
Glucose, UA: NEGATIVE mg/dL
Hgb urine dipstick: NEGATIVE
Ketones, ur: NEGATIVE mg/dL
Leukocytes,Ua: NEGATIVE
Nitrite: NEGATIVE
Protein, ur: NEGATIVE mg/dL
Specific Gravity, Urine: >1.046 — ABNORMAL HIGH (ref 1.005–1.030)
pH: 6 (ref 5.0–8.0)

## 2024-06-29 LAB — TROPONIN T, HIGH SENSITIVITY
Troponin T High Sensitivity: <15 ng/L (ref 0–19)
Troponin T High Sensitivity: <15 ng/L (ref 0–19)

## 2024-06-29 LAB — BASIC METABOLIC PANEL WITH GFR
Anion gap: 9 (ref 5–15)
BUN: 14 mg/dL (ref 6–20)
CO2: 26 mmol/L (ref 22–32)
Calcium: 8.7 mg/dL — ABNORMAL LOW (ref 8.9–10.3)
Chloride: 102 mmol/L (ref 98–111)
Creatinine, Ser: 0.79 mg/dL (ref 0.61–1.24)
GFR, Estimated: >60 mL/min (ref >60)
Glucose, Bld: 116 mg/dL — ABNORMAL HIGH (ref 70–99)
Potassium: 3.6 mmol/L (ref 3.5–5.1)
Sodium: 137 mmol/L (ref 135–145)

## 2024-06-29 MED ORDER — POTASSIUM CHLORIDE CRYS ER 20 MEQ PO TBCR
40.0000 meq | EXTENDED_RELEASE_TABLET | Freq: Once | ORAL | Status: AC
  Administered 2024-06-29: 40 meq via ORAL
  Filled 2024-06-29: qty 2

## 2024-06-29 MED ORDER — AZITHROMYCIN 250 MG PO TABS: 250.0000 mg | ORAL_TABLET | Freq: Every day | ORAL | 0 refills | Status: AC

## 2024-06-29 MED ORDER — NITROGLYCERIN 0.4 MG SL SUBL
0.4000 mg | SUBLINGUAL_TABLET | SUBLINGUAL | Status: DC | PRN
  Administered 2024-06-29: 0.4 mg via SUBLINGUAL
  Filled 2024-06-29: qty 1

## 2024-06-29 MED ORDER — NITROGLYCERIN 0.4 MG SL SUBL: 0.4000 mg | SUBLINGUAL_TABLET | SUBLINGUAL | 3 refills | Status: AC | PRN

## 2024-06-29 MED ORDER — IOHEXOL 350 MG/ML SOLN
75.0000 mL | Freq: Once | INTRAVENOUS | Status: AC | PRN
  Administered 2024-06-29: 75 mL via INTRAVENOUS

## 2024-06-29 NOTE — Discharge Instructions (Addendum)
 Try applying ice and/or heat to see if that helps the pain.  You may also take acetaminophen  as needed for pain.  Please follow-up with a cardiologist to investigate why your heart started racing so much with the only slight exertion.  Your CT suggested that there might be some inflammation in the lungs.  I have ordered an antibiotic to try to treat this.  The radiologist has also recommended that you have a follow-up CT scan in 3-6 months.  Please work with your primary care provider to adjust your dose of potassium as needed.

## 2024-06-30 ENCOUNTER — Ambulatory Visit (HOSPITAL_COMMUNITY): Admitting: Clinical

## 2024-06-30 ENCOUNTER — Telehealth: Payer: Self-pay

## 2024-06-30 ENCOUNTER — Ambulatory Visit: Attending: Physician Assistant | Admitting: Physician Assistant

## 2024-06-30 ENCOUNTER — Encounter: Payer: Self-pay | Admitting: Physician Assistant

## 2024-06-30 VITALS — BP 122/58 | HR 57 | Ht 72.0 in | Wt 162.0 lb

## 2024-06-30 DIAGNOSIS — E785 Hyperlipidemia, unspecified: Secondary | ICD-10-CM | POA: Diagnosis not present

## 2024-06-30 DIAGNOSIS — R0789 Other chest pain: Secondary | ICD-10-CM | POA: Insufficient documentation

## 2024-06-30 DIAGNOSIS — I2510 Atherosclerotic heart disease of native coronary artery without angina pectoris: Secondary | ICD-10-CM | POA: Diagnosis not present

## 2024-06-30 DIAGNOSIS — I10 Essential (primary) hypertension: Secondary | ICD-10-CM | POA: Insufficient documentation

## 2024-06-30 DIAGNOSIS — I6523 Occlusion and stenosis of bilateral carotid arteries: Secondary | ICD-10-CM

## 2024-06-30 DIAGNOSIS — I480 Paroxysmal atrial fibrillation: Secondary | ICD-10-CM | POA: Insufficient documentation

## 2024-06-30 DIAGNOSIS — Z79899 Other long term (current) drug therapy: Secondary | ICD-10-CM | POA: Diagnosis not present

## 2024-06-30 DIAGNOSIS — R002 Palpitations: Secondary | ICD-10-CM

## 2024-06-30 MED ORDER — RANOLAZINE ER 500 MG PO TB12: 500.0000 mg | ORAL_TABLET | Freq: Two times a day (BID) | ORAL | 3 refills | Status: DC

## 2024-06-30 MED ORDER — POTASSIUM CHLORIDE CRYS ER 20 MEQ PO TBCR: 40.0000 meq | EXTENDED_RELEASE_TABLET | Freq: Every day | ORAL | 3 refills | Status: DC

## 2024-06-30 NOTE — Patient Instructions (Signed)
 Medication Instructions:   START Ranexa  500 mg twice a day   INCREASE Potassium to 40 meq daily  Labwork: BNET in 1-2 weeks  Testing/Procedures: None today  Follow-Up: Keep February appointment with Richard [name]   Any Other Special Instructions Will Be Listed Below (If Applicable).  If you need a refill on your cardiac medications before your next appointment, please call your pharmacy.

## 2024-06-30 NOTE — Progress Notes (Signed)
 Cardiology Office Note:  .   Date:  06/30/2024  ID:  Richard [name], DOB 1965/08/26, MRN [id number] PCP: Richard Meade PEDLAR, FNP  [facility] Providers Cardiologist:  Jayson Sierras, MD Electrophysiologist:  Danelle Birmingham, MD {  History of Present Illness: .   Richard [name] is a 58 y.o. male  with PMHx of CAD, paroxysmal atrial fibrillation, carotid artery disease, Palpitations, HTN, HLD, asthma, anxiety and depression who reports to [facility] office for follow up.   Pertinent cardiac medical history:  CAD Nonobstructive disease by cath in 07/2015 Low-risk NST in 12/2020 Coronary CTA in 04/2023 showing 50-69% scattered stenoses but FFR only positive along distal vessels and medical management recommended unless refractory symptoms Paroxysmal atrial fibrillation Zio 04/2024: no evidence of afib/flutter Carotid artery disease Prior carotid dopplers in 2022 showed mild RICA stenosis but CTA of the neck in 07/2021 showed no evidence of stenosis.  Palpitations Zio 04/2024: predominantly NSR with an average HR of 53 bpm, 1 episode of SVT lasting for 5 beats, 1.1% PAC burden, 1.5% PVC burden, and no evidence of A-fib/flutter  Last seen in heartcare 05/29/2024 by Laymon Qua, PA-C for ED follow up.  Reported ongoing frequent palpitations described as skipping beat or fluttering sensation that occur sporadically most notably when resting associated with tachycardia.  Noted improvement in symptoms with Xanax .  Also noted taking extra potassium supplement at times with improvement as well.  Also reported baseline dyspnea on exertion that is being followed by pulmonary.  Not able to further titrate AV nodal agents due to baseline heart rate in 50s.  Recommended adding an extra Lopressor  12.5 if needed for tachycardia and heart rates greater than 100 bpm.  Continue Lopressor  12.5 mg twice daily, Xarelto  20 mg daily, simvastatin  20 mg daily, amlodipine  10 mg daily  Recent ED visit  06/28/2024 for chest pain and left arm pain.  Reported 4 days ago after straightening out his arm that he felt something pop in his upper arm.  Since then he had acute left forearm pain and a tight feeling on the left side of his chest that has been constant.  Also reported SOB, rapid heart rate, nausea and diaphoresis after carrying a chair for short distance.  Also reported being seen by PCP 2 days ago and was told that he was dehydrated and had kidney stones in his urine.  Reviewed EKG showed sinus bradycardia with no acute ischemic changes.  No significant findings with lab work except mildly elevated glucose.  No evidence of dehydration prolapse.  CXR WNL.  CTA with no evidence of PE but noted multiple waxing and waning subpleural nodules in the left lower lobe concerning for infection or inflammation.  Treated with NTG SL X1 and Klor-Con  40 mEq.  Noted some improvement with chest tightness after NTG but did not completely resolved.  Discharged on NTG as needed and azithromycin .  Today, accompanied by wife.  Reports ongoing constant left-sided chest tightness and left arm pain that has improved but is still present.  Also reports ongoing frequent palpitations that occur more with activity with heart rate up to the 90s.  He has not taken additional Lopressor  as needed since HR > 100.  Patient states he has noticed several times while in the ED for chest pain/SOB/palpitations that his potassium is normal but less than 4.  He has tried to increase dietary potassium in addition to taking daily KCl 20 mEq supplement. Additionally, patient has a history of anxiety, does not take  a daily anxiolytic and take Xanax  PRN to avoid addictive effect. Denies syncope, presyncope, dizziness, orthopnea, PND, swelling or significant weight changes, acute bleeding, or claudication.  Reports compliance with medications.  Denies any excessive consumption of caffeine or alcohol.  He recently seen GI on 06/26/2024, per chart review,  they stopped Nexium , started Trial of Voquenza, and have plans for upper endoscopy/dilation.   ROS: 10 point review of system has been reviewed and considered negative except ones been listed in the HPI.   Studies Reviewed: SABRA   Coronary CTA: 04/2023 Coronary Arteries: Right dominant with no anomalies LM: Normal LAD: 25-49% calcified proximal disease. 50-69% calcified plaque in mid and distal vessel D1: 25-49% calcified plaque ostium 50-69% calcified plaque in mid vessel D2: Small vessel not well seen Circumflex: 1-24% calcified plaque ostium. 25-49% calcified plaque in mid/distal vessel prior to OM take off OM1: Normal OM2: Normal RCA: 50-69% proximal and distal vessel 25-49% mid vessel PDA: Normal PLA: 25-49% calcified plaque   IMPRESSION: 1. Three vessel calcium  noted score 361 which is 90 th percentile for age/sex   2.  Normal ascending thoracic aorta 3.0 cm   3. CAD RADS 3 possible obstructive CAD in LAD/RCA study sent for FFR   FINDINGS: FFR CT positive in most distal RCA   RCA proximal 0.98, mid 0.81 distal 0.69   FFR CT positive in most distal LAD   LAD 0.96 proximal, 0.76 mid and 0.65 distal   FFR CT negative in LCX   LCX 0.98 proximal, 0.89 mid and 0.79 distal   IMPRESSION: FFR CT positive in most distal RCA and LAD     Event Monitor: 04/2024   Patch wear time was for 14 days and 0 hours.   Normal sinus rhythm predominantly ranging from 40 to 141 bpm with an average HR 53 bpm.   1 run of nonsustained SVT occurred lasting for 5 beats and 1 run of nonsustained SVT with aberrancy occurred lasting 4 beats.   No evidence of A-fib/flutter, ventricular arrhythmias, high-grade AV block or pauses.   1.1% PAC burden and 1.5% PVC burden.   No patient triggered events were noted. Risk Assessment/Calculations:    CHA2DS2-VASc Score = 2   This indicates a 2.2% annual risk of stroke. The patient's score is based upon: CHF History: 0 HTN History: 1 Diabetes  History: 0 Stroke History: 0 Vascular Disease History: 1 Age Score: 0 Gender Score: 0   Physical Exam:   VS:  There were no vitals taken for this visit.   Wt Readings from Last 3 Encounters:  06/28/24 164 lb (74.4 kg)  06/26/24 163 lb 6.4 oz (74.1 kg)  06/24/24 164 lb 9.6 oz (74.7 kg)    GEN: Well nourished, well developed in no acute distress while sitting in chair. Accompanied by wife.  NECK: No JVD; No carotid bruits CARDIAC: RRR, no murmurs, rubs, gallops RESPIRATORY:  Clear to auscultation without rales, wheezing or rhonchi  ABDOMEN: Soft, non-tender, non-distended EXTREMITIES:  No edema; No deformity   ASSESSMENT AND PLAN: .   CAD in native artery Hyperlipidemia LDL goal <70 Atypical Chest pain  Nonobstructive disease by cath in 07/2015 Low-risk NST in 12/2020 Coronary CTA in 04/2023 showing 50-69% scattered stenoses but FFR only positive along distal vessels and medical management recommended unless refractory symptoms Multiple ED visit in 2025 with chest pain work up with suspected atypical chest pain. Most recent ED visit was 06/28/2024 as above.  Reports ongoing constant left-sided chest tightness and left  arm pain that has improved but is still present. Not reproducible on exam to support MSK suspicion.  Denies angina symptoms. Consistent with atypical chest pain. No need for further ischemic evaluation at this time. Encouraged to follow up with GI as planned for upper endoscopy/dilation.  Continue Amlodipine  10 mg daily, Lopressor  25 mg BID, NTG prn  Order Ranexa  500 mg twice daily. Patient is unsure if he will start medication but would like to order in case he wants to proceed with starting new  medication.  If patient able to tolerate with no changes in chest pain then would increase to 1000 mg BID if indicated.  Not on ASA given the need for anticoagulation.  Offered Imdur , but patient declined due to history of frequent headaches and occasional headaches with  NTG.  PAF (paroxysmal atrial fibrillation) (HCC) Palpitations Zio 04/2024: predominantly NSR with an average HR of 53 bpm, 1 episode of SVT lasting for 5 beats, 1.1% PAC burden, 1.5% PVC burden, and no evidence of A-fib/flutter. No need to repeat zio at this time with recent heart monitor results.  Reviewed EKG 06/28/2024: sinus bradycardia with no acute ischemic changes. Reports ongoing frequent palpitations that occur more with activity with heart rate up to the 90s.  He has not taken additional Lopressor  as needed since HR > 100.  Patient states he has noticed several times while in the ED for chest pain/SOB/palpitations that his potassium is normal but less than 4. No recurrent dark stools or bleeding. On AC with CBC WNL on 06/2024 Increase KCL from 20 to 40 meq. Follow up BMP in 1-2 weeks.  Continue on Xarelto  20 daily.  (dose appropriate with CrCl > 50)   Previously on Multaq  but this was discontinued given significant bradycardia.   Bilateral carotid artery stenosis Carotid Stenosis  Prior carotid dopplers in 2022 showed mild RICA stenosis but CTA of the neck in 07/2021 showed no evidence of stenosis.  No bruits on exam. Denies presyncope/syncope.  Continue to monitor.  Will plan to repeat Carotid US  if bruit present or develops associated symptoms.   Primary hypertension BP this OV well controlled today: 122/58 Continue on Amlodipine  10 mg daily and Lopressor  12.5 mg twice daily  Encourage physical activity for 150 minutes per week and heart healthy low sodium diet. Discussed limiting sodium intake to < 2 grams daily.     Anxiety  Patient has a history of anxiety, does not take a daily anxiolytic and take Xanax  PRN to avoid addictive effect. Reportedly he previously tried Zoloft , however discontinued due to making him feel like a Zombie.  Recommended to follow up with PCP for daily anxiolytic medication that is less addictive. Patient does not seem interested in trying new  medications due to history of SE with new medications. Strongly encouraged to consider discussing with PCP. Will forward note to PCP.     Dispo: Already scheduled to follow up with Brittany Strader in 08/2023. Will keep appointment due to multiple frequent ED visit.   Signed, Lorette CINDERELLA Kapur, PA-C

## 2024-06-30 NOTE — Telephone Encounter (Signed)
 Copied from CRM #[id number]. Topic: Clinical - Medical Advice >> Jun 30, 2024 12:32 PM Emylou G wrote: Reason for CRM: Jackolyn w/[facility].. Called adv patient was in the ER - was put on antibiotics.. They adv he is having a dental procedure tomorrow and patient isn't sure if he should still have his dental procedure - Pls call them at [phone]

## 2024-06-30 NOTE — Telephone Encounter (Signed)
 Dr patel advise he finish antibiotics first and then reschedule tooth extraction. Pt and dental office have been verbally advised

## 2024-07-01 ENCOUNTER — Telehealth: Payer: Self-pay | Admitting: Cardiology

## 2024-07-01 NOTE — Telephone Encounter (Signed)
 Pt came into office stating that the pharmacy has sent requests for his metoprolol  tartrate 25 mg to be refilled. There has been no response. [facility] [address] in [place] is the pharmacy the pt uses. [phone] is the best number to reach the pt.

## 2024-07-02 ENCOUNTER — Telehealth: Payer: Self-pay

## 2024-07-02 MED ORDER — METOPROLOL TARTRATE 25 MG PO TABS: 12.5000 mg | ORAL_TABLET | Freq: Two times a day (BID) | ORAL | 3 refills | Status: DC

## 2024-07-02 NOTE — Telephone Encounter (Signed)
 Documentation for PA approval scanned to the pt's chart

## 2024-07-02 NOTE — Telephone Encounter (Signed)
 Pt approved for Voquezna  20 mg tab 07-02-2024 through 07-02-2025. Pt was advised through Cover My Meds.

## 2024-07-02 NOTE — Telephone Encounter (Signed)
 PA done on Cover My Meds for Voquezna  20mg  tab. Pt has tried/ failed: Dexilant , Nexium  and Famotidine . Dx used: K21.9 (GERD), R13.10 (Dysphagia). Waiting on a response.

## 2024-07-02 NOTE — Telephone Encounter (Signed)
 Refill sent

## 2024-07-03 ENCOUNTER — Telehealth: Payer: Self-pay | Admitting: Cardiology

## 2024-07-03 NOTE — Telephone Encounter (Signed)
 May go ahead and arrange EGD/dilation.

## 2024-07-03 NOTE — Telephone Encounter (Signed)
° °  Name: Richard [name]  DOB: 1965-07-26  MRN: [id number]   Primary Cardiologist: Jayson Sierras, MD  Chart reviewed as part of pre-operative protocol coverage.   Per Pharm D, patient has not had an Afib/aflutter ablation within the last 3 months, DCCV within the last 4 weeks, or Watchman in the last 45 days. Patient may hold Xarelto  for 2 days prior to procedure.    I will route this recommendation to the requesting party via Epic fax function and remove from pre-op pool. Please call with questions.  Barnie Hila, NP 07/03/2024, 1:57 PM

## 2024-07-03 NOTE — Telephone Encounter (Signed)
 Patient with diagnosis of afib on Xaretlo for anticoagulation.    Procedure: EGD/ESOPHAGEAL DILATION  Date of procedure: TBD   CHA2DS2-VASc Score = 4   This indicates a 4.8% annual risk of stroke. The patient's score is based upon: CHF History: 0 HTN History: 1 Diabetes History: 0 Stroke History: 2 Vascular Disease History: 1 Age Score: 0 Gender Score: 0      CrCl 105 ml/min Platelet count 179  Patient has not had an Afib/aflutter ablation in the last 3 months, DCCV within the last 4 weeks or a watchman implanted in the last 45 days   Per office protocol, patient can hold Xarelto  for 2 days prior to procedure.     **This guidance is not considered finalized until pre-operative APP has relayed final recommendations.**

## 2024-07-03 NOTE — Telephone Encounter (Signed)
 Patients wife (DPR) notified and verbalized understanding.

## 2024-07-03 NOTE — Telephone Encounter (Signed)
 Wife Gery) stated patient wants to know how many milligrams of potassium should he be getting from his food as he is also taking potassium medication.

## 2024-07-04 ENCOUNTER — Encounter: Payer: Self-pay | Admitting: *Deleted

## 2024-07-04 ENCOUNTER — Other Ambulatory Visit: Payer: Self-pay | Admitting: Internal Medicine

## 2024-07-04 NOTE — Telephone Encounter (Signed)
 Called pt, no answer and VM full Called spouse, LMOVM to call back  Needs to schedule EGD/ED with Dr. Cindie, ASA 2, hold xarelto  x 2 days prior

## 2024-07-04 NOTE — Telephone Encounter (Signed)
 Pt has been scheduled for 07/22/24. Instructions sent via mychart.  [employer] PA:   No Auth Required  [id number] EGD DIAGNOSTIC BRUSH WASH No Auth Required  [id number] EGD GUIDE WIRE INSERTION No Auth Required

## 2024-07-04 NOTE — Telephone Encounter (Signed)
 Pt spouse called wanting to know the total of Potassium for the whole day that the pt can have please advise

## 2024-07-08 ENCOUNTER — Other Ambulatory Visit: Payer: Self-pay

## 2024-07-08 ENCOUNTER — Ambulatory Visit
Admission: RE | Admit: 2024-07-08 | Discharge: 2024-07-08 | Disposition: A | Discharge status: Home / Self Care | Payer: Self-pay | Source: Ambulatory Visit | Attending: Family Medicine | Admitting: Family Medicine

## 2024-07-08 VITALS — BP 123/73 | HR 56 | Temp 98.1°F | Resp 18

## 2024-07-08 DIAGNOSIS — R079 Chest pain, unspecified: Secondary | ICD-10-CM

## 2024-07-08 DIAGNOSIS — K047 Periapical abscess without sinus: Secondary | ICD-10-CM

## 2024-07-08 MED ORDER — METRONIDAZOLE 500 MG PO TABS: 500.0000 mg | ORAL_TABLET | Freq: Two times a day (BID) | ORAL | 0 refills | Status: AC

## 2024-07-08 MED ORDER — AZITHROMYCIN 250 MG PO TABS: ORAL_TABLET | ORAL | 0 refills | Status: AC

## 2024-07-08 NOTE — Discharge Instructions (Signed)
 I have prescribed some antibiotics for your dental infection today.  Follow-up with the dentist to soon as possible.  Follow-up with your cardiologist as soon as possible regarding your chest pain and go to the emergency department if worsening at any time.

## 2024-07-08 NOTE — ED Provider Notes (Signed)
 " [facility]    CSN: [id number] Arrival date & time: 07/08/24  1153      History   Chief Complaint Chief Complaint  Patient presents with   Dental Problem    Gland problem with tooth disconfort - Entered by patient    HPI Richard [name] is a 58 y.o. male.   Patient presenting today with over a month of ongoing left lower dental infection.  Has been scheduled for early January with the dentist to help resolve the issue but requesting antibiotics until then.  Has had multiple courses of clindamycin  recently but states this affects his alpha-gal and causes esophagitis so he has been instructed by his GI specialist to stop taking this medication.  He is also having some left sided chest pain that he feels is radiating from his dental pain.  Denies associated palpitations, shortness of breath, dizziness.  Currently being followed closely by cardiology and having extensive workup and med management with them, as recently as last week.  Currently taking over-the-counter remedies with minimal relief of symptoms.    Past Medical History:  Diagnosis Date   Arthritis    Asthma    CAD (coronary artery disease)    a. Cardiac cath 07/2015 showed 65% distal Cx, 20% mid-distal LAD, 20% prox-distal RCA, EF 60%, EDP .   Colitis 1990   COPD (chronic obstructive pulmonary disease) (HCC)    Depression    Dysrhythmia    Essential hypertension    Fatty liver    Gastric ulcer 2003; 2012   2003: + esophagitis; negative H.pylori serology  2012: Dr. Harvey, mild gastritis, Bravo PH probe placement, negative H.pylori   GERD (gastroesophageal reflux disease)    Hepatic steatosis    History of hiatal hernia    Hyperlipemia    Overweight    Panic attacks    Paroxysmal atrial fibrillation (HCC)    Pulmonary nodules    Stroke [facility])    TIA (transient ischemic attack)    Type II diabetes mellitus (HCC)     Patient Active Problem List   Diagnosis Date Noted   Foul smelling urine  06/24/2024   Chest pain 04/14/2024   Cough productive of purulent sputum 12/10/2023   Leg cramps 08/14/2023   Encounter for examination following treatment at hospital 08/14/2023   UTI (urinary tract infection) 07/03/2023   Prediabetes 06/19/2023   Dermatitis 06/19/2023   Fatigue 02/16/2023   Delayed wound healing 12/05/2022   Prostate cancer screening 11/10/2022   Arm pain, left 08/04/2022   Upper airway cough syndrome vs cough variant asthma 03/14/2022   Abnormal chest CT 11/16/2021   Pulmonary nodules 11/16/2021   RLQ abdominal pain 08/14/2021   Abnormal weight loss 08/14/2021   Acute ischemic stroke (HCC) 08/03/2021   Hypokalemia 08/03/2021   Hyperglycemia due to diabetes mellitus (HCC) 08/03/2021   Atrial fibrillation, chronic (HCC) 08/03/2021   Secondary hypercoagulable state 04/27/2021   Paroxysmal atrial fibrillation (HCC)    Lateral epicondylitis, left elbow 03/27/2019   Globus sensation 02/21/2018   Other cervical disc degeneration, unspecified cervical region 01/11/2018   Tendinitis of right triceps 01/11/2018   Neck pain 12/27/2017   Lateral epicondylitis, right elbow 05/10/2017   Constipation 12/24/2015   DOE (dyspnea on exertion) 11/17/2015   Coronary artery disease    Heart palpitations 08/12/2015   TIA (transient ischemic attack) 08/12/2015   Colon cancer screening 08/02/2015   Abdominal pain 12/18/2014   Encounter for screening colonoscopy 12/18/2014   Vitamin D  deficiency  08/20/2012   Arteriosclerotic cardiovascular disease (ASCVD) 04/11/2012   Dyspepsia 11/29/2011   Chronic low back pain    Dysphagia 05/09/2011   Essential hypertension    Anxiety and depression    Controlled diabetes mellitus type 2 with complications (HCC) 10/03/2010   Cigarette smoker 09/14/2010   COPD (chronic obstructive pulmonary disease) (HCC) 09/14/2010   Mixed hyperlipidemia 11/25/2009   GERD (gastroesophageal reflux disease) 04/05/2009   Hepatic steatosis 04/05/2009     Past Surgical History:  Procedure Laterality Date   BALLOON DILATION N/A 07/25/2021   Procedure: BALLOON DILATION;  Surgeon: Cindie Carlin POUR, DO;  [facility];  Service: Endoscopy;  Laterality: N/A;   BIOPSY  07/25/2021   Procedure: BIOPSY;  Surgeon: Cindie Carlin POUR, DO;  [facility];  Service: Endoscopy;;   BIOPSY  12/04/2022   Procedure: BIOPSY;  Surgeon: Cindie Carlin POUR, DO;  [facility];  Service: Endoscopy;;   BRAVO PH STUDY  05/03/2011   DOQ:Fpoi gastritis/normal esophagus and duodenum   CARDIAC CATHETERIZATION  1990s X 1; 2005; 08/12/2015   CARDIAC CATHETERIZATION N/A 08/12/2015   Procedure: Left Heart Cath and Coronary Angiography;  Surgeon: Victory LELON Sharps, MD; LAD 20%, CFX 65%, RCA 20%, EF 60%    COLONOSCOPY  1990   COLONOSCOPY WITH PROPOFOL  N/A 11/21/2016   Dr. Harvey: non-thrombosed external hemorrhoids, one 6 mm polyp (polypoid lesion), internal hemorrhoids. TI Normal. 10 years screening   ESOPHAGOGASTRODUODENOSCOPY  05/03/2011   DOQ:fpoi gastritis   ESOPHAGOGASTRODUODENOSCOPY (EGD) WITH PROPOFOL  N/A 07/25/2021   Procedure: ESOPHAGOGASTRODUODENOSCOPY (EGD) WITH PROPOFOL ;  Surgeon: Cindie Carlin POUR, DO;  [facility];  Service: Endoscopy;  Laterality: N/A;  1:30pm   ESOPHAGOGASTRODUODENOSCOPY (EGD) WITH PROPOFOL  N/A 12/04/2022   Procedure: ESOPHAGOGASTRODUODENOSCOPY (EGD) WITH PROPOFOL ;  Surgeon: Cindie Carlin POUR, DO;  [facility];  Service: Endoscopy;  Laterality: N/A;  8:00AM;ASA 3   NECK MASS EXCISION Right    done in dr's office; behind right ear/side of ncek   POLYPECTOMY  11/21/2016   Procedure: POLYPECTOMY;  Surgeon: Harvey Margo CROME, MD;  [facility]: [facility];  Service: Endoscopy;;  descending colon polyp   SHOULDER ARTHROSCOPY W/ ROTATOR CUFF REPAIR Right 2006   acromioclavicular joint arthrosis       Home Medications    Prior to Admission medications  Medication Sig Start Date End Date Taking?  Authorizing Provider  azithromycin  (ZITHROMAX ) 250 MG tablet Take first 2 tablets together, then 1 every day until finished. 07/08/24  Yes Stuart Vernell Norris, PA-C  metroNIDAZOLE  (FLAGYL ) 500 MG tablet Take 1 tablet (500 mg total) by mouth 2 (two) times daily. 07/08/24  Yes Stuart Vernell Norris, PA-C  acetaminophen  (TYLENOL ) 325 MG tablet Take 650 mg by mouth every 6 (six) hours as needed for moderate pain (pain score 4-6).    [provider]  albuterol  (VENTOLIN  HFA) 108 (90 Base) MCG/ACT inhaler INHALE 2 PUFFS BY MOUTH EVERY 6 HOURS AS NEEDED FOR COUGHING, WHEEZING, OR SHORTNESS OF BREATH 05/26/24   Ruthell Lauraine FALCON, NP  ALPRAZolam  (XANAX ) 0.5 MG tablet Take one twice a day and two at bedtime 05/16/24   Okey Barnie SAUNDERS, MD  amLODipine  (NORVASC ) 10 MG tablet Take 1 tablet (10 mg total) by mouth daily. 02/03/24   Zhao, Xika, NP  azithromycin  (ZITHROMAX ) 250 MG tablet Take 1 tablet (250 mg total) by mouth daily. Take first 2 tablets together, then 1 every day until finished. 06/29/24   Raford Lenis, MD  budesonide -formoterol  (SYMBICORT )  160-4.5 MCG/ACT inhaler Take 2 puffs first thing in am and then another 2 puffs about 12 hours later. 01/14/24   Darlean Ozell NOVAK, MD  chlorhexidine  (PERIDEX ) 0.12 % solution Use as directed 15 mLs in the mouth or throat 2 (two) times daily. 05/30/24   Stuart Vernell Norris, PA-C  EPINEPHRINE  0.3 mg/0.3 mL IJ SOAJ injection INJECT 0.3 MG INTO THE SKIN AS NEEDED FOR ANAPHYLAXIS 07/07/24   Tobie Arleta SQUIBB, MD  esomeprazole  (NEXIUM ) 40 MG capsule Take 1 capsule (40 mg total) by mouth 2 (two) times daily before a meal. 04/08/24   Shirlean Therisa ORN, NP  lidocaine  (XYLOCAINE ) 2 % solution Use as directed 15 mLs in the mouth or throat every 3 (three) hours as needed for mouth pain. 01/15/24   Chandra Harlene LABOR, NP  lidocaine  (XYLOCAINE ) 2 % solution Use as directed 10 mLs in the mouth or throat every 3 (three) hours as needed. 05/30/24   Stuart Vernell Norris, PA-C   linaclotide  (LINZESS ) 72 MCG capsule Take 1 capsule (72 mcg total) by mouth daily before breakfast for 12 days. 05/12/24 06/30/24  Ezzard Sonny RAMAN, PA-C  metoprolol  tartrate (LOPRESSOR ) 25 MG tablet Take 0.5 tablets (12.5 mg total) by mouth 2 (two) times daily. 07/02/24   Debera Jayson MATSU, MD  nitroGLYCERIN  (NITROSTAT ) 0.4 MG SL tablet Place 1 tablet (0.4 mg total) under the tongue every 5 (five) minutes x 3 doses as needed for chest pain (if no relief after 3rd dose, proceed to the ED for an evaluation). 06/29/24   Raford Lenis, MD  potassium chloride  SA (KLOR-CON  M) 20 MEQ tablet Take 2 tablets (40 mEq total) by mouth daily. 06/30/24   [name] Lorette GRADE, PA-C  ranolazine  (RANEXA ) 500 MG 12 hr tablet Take 1 tablet (500 mg total) by mouth 2 (two) times daily. 06/30/24   [name] Lorette GRADE, PA-C  rivaroxaban  (XARELTO ) 20 MG TABS tablet TAKE 1 TABLET BY MOUTH ONCE DAILY WITH SUPPER 04/18/24   Debera Jayson MATSU, MD  simvastatin  (ZOCOR ) 20 MG tablet Take 1 tablet by mouth once daily 06/22/23   Waddell Danelle ORN, MD  Spacer/Aero-Holding Chambers (AEROCHAMBER PLUS) Device To be used as directed with inhaler for better technique and greater benefit 05/13/24   Ruthell Lauraine FALCON, NP  sucralfate  (CARAFATE ) 1 g tablet Take 1 tablet (1 g total) by mouth 4 (four) times daily -  with meals and at bedtime. 06/24/24   Tobie Suzzane POUR, MD  traMADol  (ULTRAM ) 50 MG tablet Take 1 tablet (50 mg total) by mouth every 12 (twelve) hours as needed. 04/22/24   Leath-Warren, Etta PARAS, NP  vitamin B-12 (CYANOCOBALAMIN ) 50 MCG tablet Take 50 mcg by mouth daily.    [provider]  VITAMIN D  PO Take 1 tablet by mouth daily.    [provider]  Vonoprazan Fumarate  (VOQUEZNA ) 20 MG TABS Take 1 tablet by mouth daily. 06/26/24   Shirlean Therisa ORN, NP  Vonoprazan Fumarate  (VOQUEZNA ) 20 MG TABS Take 20 mg by mouth daily. 06/26/24   Shirlean Therisa ORN, NP    Family History Family History  Problem Relation Age of Onset   Lung  cancer Mother    Alcohol abuse Mother    Heart attack Father 72   Diabetes Father    Alcohol abuse Father    Asthma Sister    Anxiety disorder Sister    Depression Sister    Anxiety disorder Sister    Hypertension Brother    Hypertension Brother  Heart attack Brother 55   Diabetes Brother    Hypertension Brother    Seizures Brother    Dementia Paternal Uncle    ADD / ADHD Daughter    Dementia Cousin    Colon cancer Neg Hx    Drug abuse Neg Hx    Bipolar disorder Neg Hx    OCD Neg Hx    Paranoid behavior Neg Hx    Schizophrenia Neg Hx    Sexual abuse Neg Hx    Physical abuse Neg Hx     Social History Social History[1]   Allergies   Alpha-gal, Dexilant  [dexlansoprazole ], Mushroom ext cmplx(shiitake-reishi-mait), Penicillins, Clindamycin /lincomycin, and Doxycycline   Review of Systems Review of Systems PER HPI  Physical Exam Triage Vital Signs ED Triage Vitals  Encounter Vitals Group     BP 07/08/24 1215 123/73     Girls Systolic BP Percentile --      Girls Diastolic BP Percentile --      Boys Systolic BP Percentile --      Boys Diastolic BP Percentile --      Pulse Rate 07/08/24 1215 (!) 56     Resp 07/08/24 1215 18     Temp 07/08/24 1215 98.1 F (36.7 C)     Temp Source 07/08/24 1215 Oral     SpO2 07/08/24 1215 96 %     Weight --      Height --      Head Circumference --      Peak Flow --      Pain Score 07/08/24 1214 3     Pain Loc --      Pain Education --      Exclude from Growth Chart --    No data found.  Updated Vital Signs BP 123/73 (BP Location: Right Arm)   Pulse (!) 56   Temp 98.1 F (36.7 C) (Oral)   Resp 18   SpO2 96%   Visual Acuity Right Eye Distance:   Left Eye Distance:   Bilateral Distance:    Right Eye Near:   Left Eye Near:    Bilateral Near:     Physical Exam Vitals and nursing note reviewed.  Constitutional:      Appearance: Normal appearance.  HENT:     Head: Atraumatic.     Mouth/Throat:     Mouth:  Mucous membranes are moist.     Comments: Poor dentition, particularly area in the left lower jaw with gingival erythema, edema to the molars Eyes:     Extraocular Movements: Extraocular movements intact.     Conjunctiva/sclera: Conjunctivae normal.  Cardiovascular:     Rate and Rhythm: Regular rhythm. Bradycardia present.  Pulmonary:     Effort: Pulmonary effort is normal.     Breath sounds: Normal breath sounds.  Chest:     Chest wall: No tenderness.  Musculoskeletal:        General: Normal range of motion.     Cervical back: Normal range of motion and neck supple.  Skin:    General: Skin is warm and dry.  Neurological:     Mental Status: He is alert and oriented to person, place, and time.  Psychiatric:        Mood and Affect: Mood normal.        Thought Content: Thought content normal.        Judgment: Judgment normal.      UC Treatments / Results  Labs (all labs ordered are listed, but only abnormal  results are displayed) Labs Reviewed - No data to display  EKG   Radiology No results found.  Procedures Procedures (including critical care time)  Medications Ordered in UC Medications - No data to display  Initial Impression / Assessment and Plan / UC Course  I have reviewed the triage vital signs and the nursing notes.  Pertinent labs & imaging results that were available during my care of the patient were reviewed by me and considered in my medical decision making (see chart for details).     Overall vital signs and exam reassuring with no red flag findings.  EKG today showing sinus bradycardia 57 bpm with no ST elevations, no significant changes noted on comparison to most recent EKG done through cardiology a week ago.  Recommended calling cardiology to see if they would like to do any further workup on his ongoing chest pain issues at this time beyond what they are already doing currently.  ED for worsening symptoms in this category at any time.  Regarding his  ongoing dental issue.  Will treat with Zithromax  and Flagyl  while awaiting his follow-up in 2 weeks.  Final Clinical Impressions(s) / UC Diagnoses   Final diagnoses:  Dental infection  Chest pain, unspecified type     Discharge Instructions      I have prescribed some antibiotics for your dental infection today.  Follow-up with the dentist to soon as possible.  Follow-up with your cardiologist as soon as possible regarding your chest pain and go to the emergency department if worsening at any time.    ED Prescriptions     Medication Sig Dispense Auth. Provider   metroNIDAZOLE  (FLAGYL ) 500 MG tablet Take 1 tablet (500 mg total) by mouth 2 (two) times daily. 14 tablet Stuart Vernell Norris, PA-C   azithromycin  (ZITHROMAX ) 250 MG tablet Take first 2 tablets together, then 1 every day until finished. 6 tablet Stuart Vernell Norris, [place]      PDMP not reviewed this encounter.    [1]  Social History Tobacco Use   Smoking status: Every Day    Current packs/day: 0.50    Average packs/day: 1 pack/day for 42.0 years (40.2 ttl pk-yrs)    Types: Cigarettes    Start date: 07/17/1982   Smokeless tobacco: Never   Tobacco comments:    1/2 pack a day 05/13/2024 KRD    1 pack lasts pt 3 days   Vaping Use   Vaping status: Never Used  Substance Use Topics   Alcohol use: Not Currently   Drug use: No     Stuart Vernell Norris, PA-C 07/08/24 1310  "

## 2024-07-08 NOTE — ED Triage Notes (Signed)
 Pt reports he dental pain x 1 month. States he has an appointment,on 1/16.

## 2024-07-11 ENCOUNTER — Ambulatory Visit (HOSPITAL_COMMUNITY): Admitting: Clinical

## 2024-07-11 DIAGNOSIS — F32A Depression, unspecified: Secondary | ICD-10-CM

## 2024-07-11 DIAGNOSIS — F331 Major depressive disorder, recurrent, moderate: Secondary | ICD-10-CM

## 2024-07-11 DIAGNOSIS — F410 Panic disorder [episodic paroxysmal anxiety] without agoraphobia: Secondary | ICD-10-CM | POA: Diagnosis not present

## 2024-07-11 DIAGNOSIS — F419 Anxiety disorder, unspecified: Secondary | ICD-10-CM

## 2024-07-11 DIAGNOSIS — F4310 Post-traumatic stress disorder, unspecified: Secondary | ICD-10-CM | POA: Diagnosis not present

## 2024-07-11 NOTE — Progress Notes (Signed)
 Virtual Visit via Video Note   I connected with Richard [name] on 07/11/24 at  11:00 AM EDT by a video enabled telemedicine application and verified that I am speaking with the correct person using two identifiers.   Location: Patient: home Provider: office   I discussed the limitations of evaluation and management by telemedicine and the availability of in person appointments. The patient expressed understanding and agreed to proceed.     THERAPIST PROGRESS NOTE     Session Time: 11:00 AM-11:45 AM   Participation Level: Active   Behavioral Response: Casual and Alert,Anxious   Type of Therapy: Individual Therapy   Treatment Goals addressed: Mood and Anxiety   Interventions: CBT   Summary: Richard [name] is a 58 y.o. male who presents with panic disorder/depression with anxiety/and PTSD. The OPT therapist worked with the patient for his OPT treatment. The OPT therapist utilized Motivational Interviewing to assist in creating therapeutic repore. The patient in the session was engaged and work in collaboration giving feedback about his triggers and symptoms over the past few weeks. The patient spoke about pre-existing trauma experiences and learning from his past and making changes and better decisions. The patient spoke about going with his wife to her family home in [place] over the Christmas holiday. The patient continues to hope his son will be able to come help him with repairs in the home. The patient spoke about seeing some of his family over the course of the holiday. The OPT therapist utilized Cognitive Behavioral Therapy through cognitive restructuring as well as worked with the patient on coping strategies to assist in management of symptoms as well as reviewed sleep, eating habits, and general health. The patient continues to work on acceptance of limited mobility based on health conditions and spoke about the impact of his physical health on his functioning. The OPT therapist  worked with the patient on staying active and the patient spoke about with recent warmer weather getting out of the home and has been doing short walks with his wife.The OPT therapist continued to urge the patient to pace his tasks and take interm breaks.The OPT therapist overviewed upcoming health appointments listed in the patients MyChart.   Suicidal/Homicidal: Nowithout intent/plan   Therapist Response:The OPT therapist worked with the patient for the patients scheduled session. The patient was engaged in his session and gave feedback in relation to triggers, symptoms, and behavior responses over the past few weeks.The patient spoke about going with his wife to her family home in [place] over the Christmas holiday. The patient continues to hope his son will be able to come help him with repairs in the home. The patient spoke about seeing some of his family over the course of the holiday. The OPT therapist worked with the patient utilizing an in session Cognitive Behavioral Therapy exercise. The patient was responsive in the session and verbalized,  I am hoping for a better year in 2026 with my health problems I had in 2025..The OPT therapist worked with the patient providing ongoing psychotherapy/education and coping skills review. The patient spoke about efforts to make good choices  by not pushing past his limits. The OPT therapist over-viewed with the patient upcoming appointments in MyChart including Cardiology, Pulmonology, and Medication Management. The patient is considering follow up with [facility] in relation to his dental pain..The patient continued to identified problems in the home contributing to his health difficulties due to mold. The patient is still waiting for a chance for  his son to come help him work on repairs he needs on the home.The patient continued to work with OPT therapist on smoking cessation.The OPT therapist will continue treatment work with the patient in  his next scheduled session.   Plan: Return again in 2/3 weeks.   Diagnosis:      Axis I: PTSD/Panic Disorder/ Depression with anxiety  Axis II: No diagnosis      Collaboration of Care: Overview of the patients involvement in the Med Management program with Dr. Okey.   Patient/Guardian was advised Release of Information must be obtained prior to any record release in order to collaborate their care with an outside provider. Patient/Guardian was advised if they have not already done so to contact the registration department to sign all necessary forms in order for us  to release information regarding their care.    Consent: Patient/Guardian gives verbal consent for treatment and assignment of benefits for services provided during this visit. Patient/Guardian expressed understanding and agreed to proceed.    I discussed the assessment and treatment plan with the patient. The patient was provided an opportunity to ask questions and all were answered. The patient agreed with the plan and demonstrated an understanding of the instructions.   The patient was advised to call back or seek an in-person evaluation if the symptoms worsen or if the condition fails to improve as anticipated.   I provided 45 minutes of non-face-to-face time during this encounter.   [name], LCSW    07/11/2024

## 2024-07-14 ENCOUNTER — Other Ambulatory Visit: Payer: Self-pay

## 2024-07-14 ENCOUNTER — Emergency Department (HOSPITAL_COMMUNITY)
Admission: EM | Admit: 2024-07-14 | Discharge: 2024-07-15 | Disposition: A | Discharge status: Home / Self Care | Attending: Emergency Medicine | Admitting: Emergency Medicine

## 2024-07-14 ENCOUNTER — Emergency Department (HOSPITAL_COMMUNITY)

## 2024-07-14 DIAGNOSIS — I2510 Atherosclerotic heart disease of native coronary artery without angina pectoris: Secondary | ICD-10-CM | POA: Insufficient documentation

## 2024-07-14 DIAGNOSIS — R079 Chest pain, unspecified: Secondary | ICD-10-CM | POA: Diagnosis not present

## 2024-07-14 DIAGNOSIS — I480 Paroxysmal atrial fibrillation: Secondary | ICD-10-CM | POA: Diagnosis not present

## 2024-07-14 DIAGNOSIS — Z7901 Long term (current) use of anticoagulants: Secondary | ICD-10-CM | POA: Insufficient documentation

## 2024-07-14 LAB — CBC
HCT: 39.9 % (ref 39.0–52.0)
Hemoglobin: 14.6 g/dL (ref 13.0–17.0)
MCH: 33.2 pg (ref 26.0–34.0)
MCHC: 36.6 g/dL — ABNORMAL HIGH (ref 30.0–36.0)
MCV: 90.7 fL (ref 80.0–100.0)
Platelets: 190 K/uL (ref 150–400)
RBC: 4.4 MIL/uL (ref 4.22–5.81)
RDW: 12.1 % (ref 11.5–15.5)
WBC: 5.9 K/uL (ref 4.0–10.5)
nRBC: 0 % (ref 0.0–0.2)

## 2024-07-14 LAB — BASIC METABOLIC PANEL WITH GFR
Anion gap: 9 (ref 5–15)
BUN: 13 mg/dL (ref 6–20)
CO2: 29 mmol/L (ref 22–32)
Calcium: 9.2 mg/dL (ref 8.9–10.3)
Chloride: 102 mmol/L (ref 98–111)
Creatinine, Ser: 0.72 mg/dL (ref 0.61–1.24)
GFR, Estimated: >60 mL/min
Glucose, Bld: 165 mg/dL — ABNORMAL HIGH (ref 70–99)
Potassium: 3.6 mmol/L (ref 3.5–5.1)
Sodium: 140 mmol/L (ref 135–145)

## 2024-07-14 LAB — TROPONIN T, HIGH SENSITIVITY: Troponin T High Sensitivity: <15 ng/L (ref 0–19)

## 2024-07-14 MED ORDER — FENTANYL CITRATE (PF) 100 MCG/2ML IJ SOLN
50.0000 ug | Freq: Once | INTRAMUSCULAR | Status: AC
  Administered 2024-07-15: 50 ug via INTRAVENOUS
  Filled 2024-07-14: qty 2

## 2024-07-14 MED ORDER — LACTATED RINGERS IV BOLUS
1000.0000 mL | Freq: Once | INTRAVENOUS | Status: AC
  Administered 2024-07-15: 1000 mL via INTRAVENOUS

## 2024-07-14 NOTE — ED Provider Notes (Signed)
 " [facility] AT [facility] Provider Note   CSN: [id number] Arrival date & time: 07/14/24  2227     Patient presents with: Chest Pain   Richard [name] is a 58 y.o. male.  {Add pertinent medical, surgical, social history, OB history to HPI:2030} 58 year old male with history of proximal atrial fibrillation, non-obstructive CAD who presents with palpitations and chest pain for the last couple days. No dyspnea.  Patient recently finished a course of azithromycin  for send upper respiratory disease.  No recent fevers, trauma, persistent cough.  No lower extremity swelling.  Has been on flecainide  in the past but was in A-fib too much at night.  Currently is on metoprolol  tartrate as he was on succinate in the past and had issues with it.  Compliant with his Xarelto .  States that the palpitations are not necessarily new but they are just stronger and more frequent than what he is used to.  Wonders if his potassium might be low.   Chest Pain      Prior to Admission medications  Medication Sig Start Date End Date Taking? Authorizing Provider  acetaminophen  (TYLENOL ) 325 MG tablet Take 650 mg by mouth every 6 (six) hours as needed for moderate pain (pain score 4-6).    [provider]  albuterol  (VENTOLIN  HFA) 108 (90 Base) MCG/ACT inhaler INHALE 2 PUFFS BY MOUTH EVERY 6 HOURS AS NEEDED FOR COUGHING, WHEEZING, OR SHORTNESS OF BREATH 05/26/24   Ruthell Lauraine FALCON, NP  ALPRAZolam  (XANAX ) 0.5 MG tablet Take one twice a day and two at bedtime 05/16/24   Okey Barnie SAUNDERS, MD  amLODipine  (NORVASC ) 10 MG tablet Take 1 tablet (10 mg total) by mouth daily. 02/03/24   Zhao, Xika, NP  azithromycin  (ZITHROMAX ) 250 MG tablet Take 1 tablet (250 mg total) by mouth daily. Take first 2 tablets together, then 1 every day until finished. 06/29/24   Raford Lenis, MD  azithromycin  (ZITHROMAX ) 250 MG tablet Take first 2 tablets together, then 1 every day until finished. 07/08/24    Stuart Vernell Norris, PA-C  budesonide -formoterol  (SYMBICORT ) 160-4.5 MCG/ACT inhaler Take 2 puffs first thing in am and then another 2 puffs about 12 hours later. 01/14/24   Darlean Ozell NOVAK, MD  chlorhexidine  (PERIDEX ) 0.12 % solution Use as directed 15 mLs in the mouth or throat 2 (two) times daily. 05/30/24   Stuart Vernell Norris, PA-C  EPINEPHRINE  0.3 mg/0.3 mL IJ SOAJ injection INJECT 0.3 MG INTO THE SKIN AS NEEDED FOR ANAPHYLAXIS 07/07/24   Tobie Arleta SQUIBB, MD  esomeprazole  (NEXIUM ) 40 MG capsule Take 1 capsule (40 mg total) by mouth 2 (two) times daily before a meal. 04/08/24   Shirlean Therisa ORN, NP  lidocaine  (XYLOCAINE ) 2 % solution Use as directed 15 mLs in the mouth or throat every 3 (three) hours as needed for mouth pain. 01/15/24   Chandra Harlene LABOR, NP  lidocaine  (XYLOCAINE ) 2 % solution Use as directed 10 mLs in the mouth or throat every 3 (three) hours as needed. 05/30/24   Stuart Vernell Norris, PA-C  linaclotide  (LINZESS ) 72 MCG capsule Take 1 capsule (72 mcg total) by mouth daily before breakfast for 12 days. 05/12/24 06/30/24  Ezzard Sonny RAMAN, PA-C  metoprolol  tartrate (LOPRESSOR ) 25 MG tablet Take 0.5 tablets (12.5 mg total) by mouth 2 (two) times daily. 07/02/24   Debera Jayson MATSU, MD  metroNIDAZOLE  (FLAGYL ) 500 MG tablet Take 1 tablet (500 mg total) by mouth 2 (two) times daily. 07/08/24  Stuart Vernell Norris, PA-C  nitroGLYCERIN  (NITROSTAT ) 0.4 MG SL tablet Place 1 tablet (0.4 mg total) under the tongue every 5 (five) minutes x 3 doses as needed for chest pain (if no relief after 3rd dose, proceed to the ED for an evaluation). 06/29/24   Raford Lenis, MD  potassium chloride  SA (KLOR-CON  M) 20 MEQ tablet Take 2 tablets (40 mEq total) by mouth daily. 06/30/24   [name] Lorette GRADE, PA-C  ranolazine  (RANEXA ) 500 MG 12 hr tablet Take 1 tablet (500 mg total) by mouth 2 (two) times daily. 06/30/24   [name] Lorette GRADE, PA-C  rivaroxaban  (XARELTO ) 20 MG TABS tablet TAKE 1 TABLET BY  MOUTH ONCE DAILY WITH SUPPER 04/18/24   Debera Jayson MATSU, MD  simvastatin  (ZOCOR ) 20 MG tablet Take 1 tablet by mouth once daily 06/22/23   Waddell Danelle ORN, MD  Spacer/Aero-Holding Chambers (AEROCHAMBER PLUS) Device To be used as directed with inhaler for better technique and greater benefit 05/13/24   Ruthell Lauraine FALCON, NP  sucralfate  (CARAFATE ) 1 g tablet Take 1 tablet (1 g total) by mouth 4 (four) times daily -  with meals and at bedtime. 06/24/24   Tobie Suzzane POUR, MD  traMADol  (ULTRAM ) 50 MG tablet Take 1 tablet (50 mg total) by mouth every 12 (twelve) hours as needed. 04/22/24   Leath-Warren, Etta PARAS, NP  vitamin B-12 (CYANOCOBALAMIN ) 50 MCG tablet Take 50 mcg by mouth daily.    [provider]  VITAMIN D  PO Take 1 tablet by mouth daily.    [provider]  Vonoprazan Fumarate  (VOQUEZNA ) 20 MG TABS Take 1 tablet by mouth daily. 06/26/24   Shirlean Therisa ORN, NP  Vonoprazan Fumarate  (VOQUEZNA ) 20 MG TABS Take 20 mg by mouth daily. 06/26/24   Shirlean Therisa ORN, NP    Allergies: Alpha-gal, Dexilant  Carmenta.carmin ], Mushroom ext cmplx(shiitake-reishi-mait), Penicillins, Clindamycin /lincomycin, and Doxycycline    Review of Systems  Cardiovascular:  Positive for chest pain.    Updated Vital Signs BP 128/74 (BP Location: Right Arm)   Pulse 85   Temp 98.7 F (37.1 C) (Oral)   Resp 16   Ht 6' (1.829 m)   Wt 70.3 kg   BMI 21.02 kg/m   Physical Exam Vitals and nursing note reviewed.  Constitutional:      Appearance: He is well-developed.  HENT:     Head: Normocephalic and atraumatic.  Cardiovascular:     Rate and Rhythm: Normal rate. Rhythm irregular.  Pulmonary:     Effort: Pulmonary effort is normal. No respiratory distress.  Abdominal:     General: There is no distension.  Musculoskeletal:        General: Normal range of motion.     Cervical back: Normal range of motion.  Neurological:     Mental Status: He is alert.     (all labs ordered are listed, but only  abnormal results are displayed) Labs Reviewed  CBC - Abnormal; Notable for the following components:      Result Value   MCHC 36.6 (*)    All other components within normal limits  BASIC METABOLIC PANEL WITH GFR  TROPONIN T, HIGH SENSITIVITY    EKG: EKG Interpretation Date/Time:  Monday July 14 2024 22:39:43 EST Ventricular Rate:  102 PR Interval:  184 QRS Duration:  97 QT Interval:  357 QTC Calculation: 465 R Axis:   83  Text Interpretation: Atrial fibrillation Repol abnrm suggests ischemia, inferior leads Confirmed by Lorette Mayo [id number]) on 07/14/2024 10:59:09 PM  Radiology:  No results found.  {Document cardiac monitor, telemetry assessment procedure when appropriate:32947} Procedures   Medications Ordered in the ED - No data to display    {Click here for ABCD2, HEART and other calculators REFRESH Note before signing:1}                              Medical Decision Making Amount and/or Complexity of Data Reviewed Labs: ordered. Radiology: ordered.  Patient with increased symptomatic Afib. Already on appropriate outpatient medications, will check labs, give some fentanyl , fluids. Rule out other causes for cp such as ACS, pneumonia. Patient has been here multiple times this year for chest/afib related complaints, may be a candidate for ablation at some point.  ***  {Document critical care time when appropriate  Document review of labs and clinical decision tools ie CHADS2VASC2, etc  Document your independent review of radiology images and any outside records  Document your discussion with family members, caretakers and with consultants  Document social determinants of health affecting pt's care  Document your decision making why or why not admission, treatments were needed:32947:::1}   Final diagnoses:  None    ED Discharge Orders     None        "

## 2024-07-14 NOTE — ED Triage Notes (Signed)
 Pt presents with CP - feels like horses galloping across his chest. Pain is currently 4/10. Was on metoprolol  thirty days ago, but cardiologist stopped it because his heart rate was too low. Had short (less then 30 second) episode of a fib while RN at bedside. Second EKG obtained.

## 2024-07-15 LAB — URINALYSIS, W/ REFLEX TO CULTURE (INFECTION SUSPECTED)
Bacteria, UA: NONE SEEN
Bilirubin Urine: NEGATIVE
Glucose, UA: NEGATIVE mg/dL
Hgb urine dipstick: NEGATIVE
Ketones, ur: NEGATIVE mg/dL
Leukocytes,Ua: NEGATIVE
Nitrite: NEGATIVE
Protein, ur: NEGATIVE mg/dL
Specific Gravity, Urine: 1.005 (ref 1.005–1.030)
pH: 8 (ref 5.0–8.0)

## 2024-07-15 LAB — TROPONIN T, HIGH SENSITIVITY: Troponin T High Sensitivity: <15 ng/L (ref 0–19)

## 2024-07-15 LAB — MAGNESIUM: Magnesium: 1.9 mg/dL (ref 1.7–2.4)

## 2024-07-15 MED ORDER — POTASSIUM CHLORIDE CRYS ER 20 MEQ PO TBCR
40.0000 meq | EXTENDED_RELEASE_TABLET | Freq: Once | ORAL | Status: AC
  Administered 2024-07-15: 40 meq via ORAL
  Filled 2024-07-15: qty 2

## 2024-07-15 MED ORDER — POTASSIUM CHLORIDE CRYS ER 20 MEQ PO TBCR: 40.0000 meq | EXTENDED_RELEASE_TABLET | Freq: Two times a day (BID) | ORAL | 3 refills | Status: AC

## 2024-07-15 MED ORDER — POTASSIUM CHLORIDE 10 MEQ/100ML IV SOLN
10.0000 meq | Freq: Once | INTRAVENOUS | Status: AC
  Administered 2024-07-15: 10 meq via INTRAVENOUS
  Filled 2024-07-15: qty 100

## 2024-07-15 NOTE — ED Notes (Signed)
 Patient monitor read sinus brady with heart rate of 35. Went in checked on patient , patient stated that he was having chest pressure and weakness. Repeat EKG when heart rate of 44. EDP made aware and given EKG.

## 2024-07-16 ENCOUNTER — Encounter: Payer: Self-pay | Admitting: Cardiology

## 2024-07-16 ENCOUNTER — Ambulatory Visit: Attending: Cardiology | Admitting: Cardiology

## 2024-07-16 VITALS — BP 138/66 | HR 61 | Ht 72.0 in | Wt 160.2 lb

## 2024-07-16 DIAGNOSIS — I480 Paroxysmal atrial fibrillation: Secondary | ICD-10-CM | POA: Diagnosis not present

## 2024-07-16 DIAGNOSIS — E782 Mixed hyperlipidemia: Secondary | ICD-10-CM | POA: Insufficient documentation

## 2024-07-16 DIAGNOSIS — I2510 Atherosclerotic heart disease of native coronary artery without angina pectoris: Secondary | ICD-10-CM | POA: Diagnosis not present

## 2024-07-16 DIAGNOSIS — I10 Essential (primary) hypertension: Secondary | ICD-10-CM | POA: Diagnosis not present

## 2024-07-16 MED ORDER — METOPROLOL SUCCINATE ER 25 MG PO TB24: 12.5000 mg | ORAL_TABLET | Freq: Every day | ORAL | 6 refills | Status: DC

## 2024-07-16 NOTE — Pre-Procedure Instructions (Signed)
 Patient was in ED 12/29. Dr Cindie messaged to look at chart to make sure he wants to proceed wit procedure on 07/22/2024.

## 2024-07-16 NOTE — Patient Instructions (Addendum)
 Medication Instructions:   Change Lopressor  to Toprol  XL 12.5mg  daily Ranexa  removed from list today  Continue all other medications.     Labwork:  none  Testing/Procedures:  none  Follow-Up:  6 weeks   Any Other Special Instructions Will Be Listed Below (If Applicable).  You have been referred to:  EP - [place] office   If you need a refill on your cardiac medications before your next appointment, please call your pharmacy.

## 2024-07-16 NOTE — Progress Notes (Signed)
 "    Cardiology Office Note  Date: 07/16/2024   ID: Jakhari L Sox, DOB 06-22-1966, MRN [id number]  History of Present Illness: Richard [name] is a 58 y.o. male seen recently on December 15 by Ms. [name] RIGGERS, I reviewed her note as well as interval chart since our last encounter in September.  He has had ER visits for evaluation of chest discomfort and palpitations over the last month including December 29.  His most recent ECG did show atrial fibrillation converting to sinus rhythm. Serial high-sensitivity troponin T levels have all been normal during ER encounters.  Lab work from most recent ER encounter included magnesium  of 1.9, potassium 3.6 with normal renal function, and normal hemoglobin.  He is here today with his wife for follow-up.  Reports being very troubled by recurring symptoms, describes onset of palpitations as a rapid forceful heartbeat, this is generally followed by discomfort in his chest and at times his left forearm.  He feels weak after these episodes and states that he lacks energy in general.  He was most recently on Multaq  for suppression of atrial fibrillation although did not tolerate this due to resting bradycardia and sinus rhythm.  AV nodal blockers have also been limited due to tendency to bradycardia.  He has been taking Lopressor  12.5 mg twice daily, but tells me that he felt better when he was on Toprol -XL 12.5 mg daily.  In the past he failed rhythm control with flecainide .  He previously followed with Dr. Waddell.  I reviewed his ECG today which shows normal sinus rhythm.  Physical Exam: VS:  BP 138/66   Pulse 61   Ht 6' (1.829 m)   Wt 160 lb 3.2 oz (72.7 kg)   SpO2 98%   BMI 21.73 kg/m , BMI Body mass index is 21.73 kg/m.  Wt Readings from Last 3 Encounters:  07/16/24 160 lb 3.2 oz (72.7 kg)  07/14/24 155 lb (70.3 kg)  06/30/24 162 lb (73.5 kg)    General: Patient appears comfortable at rest. HEENT: Conjunctiva and lids normal. Neck: Supple, no  elevated JVP or carotid bruits. Lungs: Clear to auscultation, nonlabored breathing at rest. Cardiac: Regular rate and rhythm, no S3 or significant systolic murmur. Extremities: No pitting edema.  ECG:  An ECG dated 07/14/2024 was personally reviewed today and demonstrated:  Atrial fibrillation transitioning to sinus rhythm.  Labwork: 05/23/2024: TSH 1.810 06/21/2024: ALT 11; AST 14 07/14/2024: BUN 13; Creatinine, Ser 0.72; Hemoglobin 14.6; Magnesium  1.9; Platelets 190; Potassium 3.6; Sodium 140     Component Value Date/Time   CHOL 119 01/24/2024 0856   TRIG 90 01/24/2024 0856   HDL 39 (L) 01/24/2024 0856   CHOLHDL 3.1 01/24/2024 0856   CHOLHDL 2.9 06/13/2022 0945   VLDL 8 06/13/2022 0945   LDLCALC 63 01/24/2024 0856   Other Studies Reviewed Today:  Cardiac monitor October 2025:   Patch wear time was for 14 days and 0 hours.   Normal sinus rhythm predominantly ranging from 40 to 141 bpm with an average HR 53 bpm.   1 run of nonsustained SVT occurred lasting for 5 beats and 1 run of nonsustained SVT with aberrancy occurred lasting 4 beats.   No evidence of A-fib/flutter, ventricular arrhythmias, high-grade AV block or pauses.   1.1% PAC burden and 1.5% PVC burden.   No patient triggered events were noted.  Chest x-ray 07/14/2024: FINDINGS:   LUNGS AND PLEURA: No focal pulmonary opacity. No pleural effusion. No pneumothorax.   HEART AND  MEDIASTINUM: No acute abnormality of the cardiac and mediastinal silhouettes.   BONES AND SOFT TISSUES: No acute osseous abnormality.   IMPRESSION: 1. No acute cardiopulmonary pathology.  Assessment and Plan:  1.  Symptomatic paroxysmal atrial fibrillation as discussed above, increasing rhythm frequency.  CHA2DS2-VASc score is 2.  He did not have adequate rhythm suppression on flecainide  in the past nor did he tolerate Multaq  due to bradycardia.  Plan to switch from Lopressor  to Toprol -XL 12.5 mg daily (states that he tolerated that better  previously).  Continue Xarelto  20 mg daily for stroke prophylaxis.  Plan to have him establish with Dr. Almetta for further discussion regarding antiarrhythmic therapy such as Tikosyn versus atrial fibrillation ablation.  2.  Nonobstructive CAD documented by previous cardiac catheterization in 2017 and more recently by coronary CTA in October 2024 demonstrating moderate stenoses with FFR significant only in distal vessels and plan for medical therapy.  Has been experiencing angina with breakthrough atrial fibrillation although no ACS with normal high-sensitivity troponin T levels on ER evaluation.  Currently not on aspirin  given use of Xarelto .  He was prescribed Ranexa  by Ms. Dunlap PA-C at last office visit although has not yet started it, I have asked him to hold off on that for now.  Continue statin therapy and as needed nitroglycerin .  3.  Primary hypertension.  Continue Norvasc  10 mg daily.   4.  Mixed hyperlipidemia.  LDL 63 in July.  Continue Zocor  20 mg daily.  Disposition:  Follow up 6 weeks.  Signed, Jayson JUDITHANN Sierras, M.D., F.A.C.C. [facility]

## 2024-07-16 NOTE — Pre-Procedure Instructions (Signed)
 Dr Herschell also messaged to review ED chart.

## 2024-07-16 NOTE — Pre-Procedure Instructions (Signed)
 Dr Herschell reviewed chart and feel he needs cardiac clearance before his procedure is done. Dr Cindie raker as well.

## 2024-07-18 ENCOUNTER — Other Ambulatory Visit: Payer: Self-pay

## 2024-07-18 ENCOUNTER — Emergency Department (HOSPITAL_COMMUNITY)

## 2024-07-18 ENCOUNTER — Ambulatory Visit: Admitting: Orthopedic Surgery

## 2024-07-18 ENCOUNTER — Encounter (HOSPITAL_COMMUNITY): Payer: Self-pay

## 2024-07-18 ENCOUNTER — Emergency Department (HOSPITAL_COMMUNITY)
Admission: EM | Admit: 2024-07-18 | Discharge: 2024-07-18 | Disposition: A | Discharge status: Home / Self Care | Attending: Emergency Medicine | Admitting: Emergency Medicine

## 2024-07-18 ENCOUNTER — Encounter: Payer: Self-pay | Admitting: Orthopedic Surgery

## 2024-07-18 ENCOUNTER — Encounter (HOSPITAL_COMMUNITY)
Admission: RE | Admit: 2024-07-18 | Discharge: 2024-07-18 | Disposition: A | Discharge status: Home / Self Care | Source: Ambulatory Visit | Attending: Internal Medicine | Admitting: Internal Medicine

## 2024-07-18 DIAGNOSIS — I480 Paroxysmal atrial fibrillation: Secondary | ICD-10-CM | POA: Diagnosis not present

## 2024-07-18 DIAGNOSIS — M542 Cervicalgia: Secondary | ICD-10-CM

## 2024-07-18 DIAGNOSIS — R002 Palpitations: Secondary | ICD-10-CM | POA: Diagnosis present

## 2024-07-18 LAB — CBC WITH DIFFERENTIAL/PLATELET
Abs Immature Granulocytes: 0.01 K/uL (ref 0.00–0.07)
Basophils Absolute: 0 K/uL (ref 0.0–0.1)
Basophils Relative: 0 %
Eosinophils Absolute: 0.2 K/uL (ref 0.0–0.5)
Eosinophils Relative: 2 %
HCT: 39.2 % (ref 39.0–52.0)
Hemoglobin: 14 g/dL (ref 13.0–17.0)
Immature Granulocytes: 0 %
Lymphocytes Relative: 27 %
Lymphs Abs: 1.7 K/uL (ref 0.7–4.0)
MCH: 32.5 pg (ref 26.0–34.0)
MCHC: 35.7 g/dL (ref 30.0–36.0)
MCV: 91 fL (ref 80.0–100.0)
Monocytes Absolute: 0.4 K/uL (ref 0.1–1.0)
Monocytes Relative: 7 %
Neutro Abs: 4 K/uL (ref 1.7–7.7)
Neutrophils Relative %: 64 %
Platelets: 173 K/uL (ref 150–400)
RBC: 4.31 MIL/uL (ref 4.22–5.81)
RDW: 11.9 % (ref 11.5–15.5)
WBC: 6.3 K/uL (ref 4.0–10.5)
nRBC: 0 % (ref 0.0–0.2)

## 2024-07-18 LAB — COMPREHENSIVE METABOLIC PANEL WITH GFR
ALT: 10 U/L (ref 0–44)
AST: 14 U/L — ABNORMAL LOW (ref 15–41)
Albumin: 4.3 g/dL (ref 3.5–5.0)
Alkaline Phosphatase: 55 U/L (ref 38–126)
Anion gap: 16 — ABNORMAL HIGH (ref 5–15)
BUN: 16 mg/dL (ref 6–20)
CO2: 21 mmol/L — ABNORMAL LOW (ref 22–32)
Calcium: 9.3 mg/dL (ref 8.9–10.3)
Chloride: 102 mmol/L (ref 98–111)
Creatinine, Ser: 0.71 mg/dL (ref 0.61–1.24)
GFR, Estimated: >60 mL/min
Glucose, Bld: 124 mg/dL — ABNORMAL HIGH (ref 70–99)
Potassium: 3.7 mmol/L (ref 3.5–5.1)
Sodium: 139 mmol/L (ref 135–145)
Total Bilirubin: 0.4 mg/dL (ref 0.0–1.2)
Total Protein: 6.7 g/dL (ref 6.5–8.1)

## 2024-07-18 LAB — MAGNESIUM: Magnesium: 1.9 mg/dL (ref 1.7–2.4)

## 2024-07-18 LAB — TROPONIN T, HIGH SENSITIVITY: Troponin T High Sensitivity: <15 ng/L (ref 0–19)

## 2024-07-18 MED ORDER — POTASSIUM CHLORIDE CRYS ER 20 MEQ PO TBCR
40.0000 meq | EXTENDED_RELEASE_TABLET | Freq: Once | ORAL | Status: AC
  Administered 2024-07-18: 40 meq via ORAL
  Filled 2024-07-18: qty 2

## 2024-07-18 MED ORDER — METHOCARBAMOL 500 MG PO TABS: 500.0000 mg | ORAL_TABLET | Freq: Three times a day (TID) | ORAL | 0 refills | Status: AC

## 2024-07-18 NOTE — Progress Notes (Signed)
 New Patient Visit  Summary: Richard [name] is a 59 y.o. male with the following: 1. Neck pain  Assessment & Plan Cervical spondylosis with neck pain Chronic cervical spondylosis with axial neck pain due to degenerative changes and muscle spasm. No acute nerve compression or significant neurological deficit. Imaging shows mild to moderate spondylosis and disc space narrowing. Symptoms exacerbated by poor posture and muscle weakness. Crepitus benign from arthritis. Gradual improvement expected. - Prescribed muscle relaxant, sent prescription to [facility] on [address]. - Provided written instructions for cervical exercises. - Recommended heating pad use and stretching in hot shower. - Advised against prolonged static cervical positioning, especially with head flexion or screen use, and to take frequent breaks. - Encouraged gradual stretching and strengthening of cervical muscles. - Reassured crepitus is benign and related to arthritis. - Counseled gradual improvement.     Follow-up: Return if symptoms worsen or fail to improve.  Subjective:  Chief Complaint  Patient presents with   Neck Pain    Going into the L shoulder for a couple yrs. Pt states he fell in the tub and hit the back of the neck. Does have a popping sensation in the neck and shoulder.     Discussed the use of AI scribe software for clinical note transcription with the patient, who gave verbal consent to proceed.  History of Present Illness Richard [name] is a 59 year old male with cervical spondylosis who presents with progressive neck pain radiating to the shoulder.    He has had several months of worsening posterior neck pain radiating to the shoulder and clavicular region, with crepitus on head movement. Pain is associated with stiffness and soreness, and can occasionally radiate over the head and into the throat with neck extension. He has intermittent numbness and tingling in the fingers without constant  sensory loss or clear radicular pain into the hand.  Symptoms are worsened by prolonged static positions, especially sitting at a computer. He notes increased neck muscle tightness, soreness, and perceived loss of muscle mass and strength. He avoids strenuous activity due to concerns about syncope and current anticoagulation therapy.  He fell two years ago onto his back and flank and needed about one month to recover. Prior cervical spine x rays reportedly showed degenerative changes described as arthritis. He also sustained a shoulder fracture in April of last year and has had persistent limitation of shoulder range of motion since.  He uses Tylenol  and heating pads intermittently with partial relief. He is not currently using muscle relaxants. He has not done regular neck exercises due to recurrent hypokalemia and use of apixaban, and he avoids NSAIDs because of anticoagulation.    Review of Systems: No fevers or chills No numbness or tingling No chest pain No shortness of breath No bowel or bladder dysfunction No GI distress No headaches   Medical History:  Past Medical History:  Diagnosis Date   Arthritis    Asthma    CAD (coronary artery disease)    a. Cardiac cath 07/2015 showed 65% distal Cx, 20% mid-distal LAD, 20% prox-distal RCA, EF 60%, EDP .   Colitis 1990   COPD (chronic obstructive pulmonary disease) (HCC)    Depression    Dysrhythmia    Essential hypertension    Fatty liver    Gastric ulcer 2003; 2012   2003: + esophagitis; negative H.pylori serology  2012: Dr. Harvey, mild gastritis, Bravo PH probe placement, negative H.pylori   GERD (gastroesophageal reflux disease)  Hepatic steatosis    History of hiatal hernia    Hyperlipemia    Overweight    Panic attacks    Paroxysmal atrial fibrillation (HCC)    Pulmonary nodules    Stroke (HCC)    TIA (transient ischemic attack)    Type II diabetes mellitus (HCC)     Past Surgical History:  Procedure  Laterality Date   BALLOON DILATION N/A 07/25/2021   Procedure: BALLOON DILATION;  Surgeon: Cindie Carlin POUR, DO;  [facility]: [facility];  Service: Endoscopy;  Laterality: N/A;   BIOPSY  07/25/2021   Procedure: BIOPSY;  Surgeon: Cindie Carlin POUR, DO;  [facility]: [facility];  Service: Endoscopy;;   BIOPSY  12/04/2022   Procedure: BIOPSY;  Surgeon: Cindie Carlin POUR, DO;  [facility]: [facility];  Service: Endoscopy;;   BRAVO PH STUDY  05/03/2011   DOQ:Fpoi gastritis/normal esophagus and duodenum   CARDIAC CATHETERIZATION  1990s X 1; 2005; 08/12/2015   CARDIAC CATHETERIZATION N/A 08/12/2015   Procedure: Left Heart Cath and Coronary Angiography;  Surgeon: Victory LELON Sharps, MD; LAD 20%, CFX 65%, RCA 20%, EF 60%    COLONOSCOPY  1990   COLONOSCOPY WITH PROPOFOL  N/A 11/21/2016   Dr. Harvey: non-thrombosed external hemorrhoids, one 6 mm polyp (polypoid lesion), internal hemorrhoids. TI Normal. 10 years screening   ESOPHAGOGASTRODUODENOSCOPY  05/03/2011   DOQ:fpoi gastritis   ESOPHAGOGASTRODUODENOSCOPY (EGD) WITH PROPOFOL  N/A 07/25/2021   Procedure: ESOPHAGOGASTRODUODENOSCOPY (EGD) WITH PROPOFOL ;  Surgeon: Cindie Carlin POUR, DO;  [facility]: [facility];  Service: Endoscopy;  Laterality: N/A;  1:30pm   ESOPHAGOGASTRODUODENOSCOPY (EGD) WITH PROPOFOL  N/A 12/04/2022   Procedure: ESOPHAGOGASTRODUODENOSCOPY (EGD) WITH PROPOFOL ;  Surgeon: Cindie Carlin POUR, DO;  [facility]: [facility];  Service: Endoscopy;  Laterality: N/A;  8:00AM;ASA 3   NECK MASS EXCISION Right    done in dr's office; behind right ear/side of ncek   POLYPECTOMY  11/21/2016   Procedure: POLYPECTOMY;  Surgeon: Harvey Margo CROME, MD;  Location: [facility];  Service: Endoscopy;;  descending colon polyp   SHOULDER ARTHROSCOPY W/ ROTATOR CUFF REPAIR Right 2006   acromioclavicular joint arthrosis    Family History  Problem Relation Age of Onset   Lung cancer Mother    Alcohol abuse Mother    Heart attack Father 59   Diabetes Father     Alcohol abuse Father    Asthma Sister    Anxiety disorder Sister    Depression Sister    Anxiety disorder Sister    Hypertension Brother    Hypertension Brother    Heart attack Brother 36   Diabetes Brother    Hypertension Brother    Seizures Brother    Dementia Paternal Uncle    ADD / ADHD Daughter    Dementia Cousin    Colon cancer Neg Hx    Drug abuse Neg Hx    Bipolar disorder Neg Hx    OCD Neg Hx    Paranoid behavior Neg Hx    Schizophrenia Neg Hx    Sexual abuse Neg Hx    Physical abuse Neg Hx    Social History[1]  Allergies[2]  Active Medications[3]  Objective: There were no vitals taken for this visit.  Physical Exam:    General: Alert and oriented. and No acute distress. Gait: Normal gait.  Physical Exam NECK: Strength normal, no constant numbness or tingling. Rotation causes soreness and popping.  Negative Lhermitte's.  Tenderness within the neck. MUSCULOSKELETAL: Shoulder strength normal.  Full range of motion of  bilateral shoulders.  5/5 strength throughout.  Excellent grip strength.   IMAGING: I personally ordered and reviewed the following images   X-rays of the cervical spine were obtained in clinic today.  No acute injuries are noted.  Slight loss of normal curvature.  There is slight loss of disc height at C4-5 and C5-6.  No anterolisthesis.  Small osteophytes are appreciated.  No bony lesions.  Impression: Cervical spondylosis     New Medications:  Meds ordered this encounter  Medications   methocarbamol (ROBAXIN) 500 MG tablet    Sig: Take 1 tablet (500 mg total) by mouth 3 (three) times daily.    Dispense:  60 tablet    Refill:  0      Portions of this note were completed via Scientist, clinical (histocompatibility and immunogenetics).  Oneil DELENA Horde, MD  07/18/2024 9:31 AM      [1]  Social History Tobacco Use   Smoking status: Every Day    Current packs/day: 0.50    Average packs/day: 1 pack/day for 42.0 years (40.2 ttl pk-yrs)    Types: Cigarettes     Start date: 07/17/1982   Smokeless tobacco: Never   Tobacco comments:    1/2 pack a day 05/13/2024 KRD    1 pack lasts pt 3 days   Vaping Use   Vaping status: Never Used  Substance Use Topics   Alcohol use: Not Currently   Drug use: No  [2]  Allergies Allergen Reactions   Alpha-Gal Anaphylaxis   Dexilant  [Dexlansoprazole ] Anaphylaxis   Mushroom Ext Cmplx(Shiitake-Reishi-Mait) Anaphylaxis    Rapid heart rate.   Penicillins Anaphylaxis    Immediate rash, facial/tongue/throat swelling, SOB or lightheadedness with hypotension   Clindamycin /Lincomycin     Gastric problems.    Doxycycline Nausea And Vomiting       [3]  Current Meds  Medication Sig   acetaminophen  (TYLENOL ) 325 MG tablet Take 650 mg by mouth every 6 (six) hours as needed for moderate pain (pain score 4-6).   albuterol  (VENTOLIN  HFA) 108 (90 Base) MCG/ACT inhaler INHALE 2 PUFFS BY MOUTH EVERY 6 HOURS AS NEEDED FOR COUGHING, WHEEZING, OR SHORTNESS OF BREATH   ALPRAZolam  (XANAX ) 0.5 MG tablet Take one twice a day and two at bedtime   amLODipine  (NORVASC ) 10 MG tablet Take 1 tablet (10 mg total) by mouth daily.   azithromycin  (ZITHROMAX ) 250 MG tablet Take 1 tablet (250 mg total) by mouth daily. Take first 2 tablets together, then 1 every day until finished.   azithromycin  (ZITHROMAX ) 250 MG tablet Take first 2 tablets together, then 1 every day until finished.   budesonide -formoterol  (SYMBICORT ) 160-4.5 MCG/ACT inhaler Take 2 puffs first thing in am and then another 2 puffs about 12 hours later.   chlorhexidine  (PERIDEX ) 0.12 % solution Use as directed 15 mLs in the mouth or throat 2 (two) times daily.   EPINEPHRINE  0.3 mg/0.3 mL IJ SOAJ injection INJECT 0.3 MG INTO THE SKIN AS NEEDED FOR ANAPHYLAXIS   esomeprazole  (NEXIUM ) 40 MG capsule Take 1 capsule (40 mg total) by mouth 2 (two) times daily before a meal.   lidocaine  (XYLOCAINE ) 2 % solution Use as directed 15 mLs in the mouth or throat every 3 (three) hours as  needed for mouth pain.   lidocaine  (XYLOCAINE ) 2 % solution Use as directed 10 mLs in the mouth or throat every 3 (three) hours as needed.   linaclotide  (LINZESS ) 72 MCG capsule Take 1 capsule (72 mcg total) by mouth daily before breakfast for  12 days.   methocarbamol (ROBAXIN) 500 MG tablet Take 1 tablet (500 mg total) by mouth 3 (three) times daily.   metoprolol  succinate (TOPROL  XL) 25 MG 24 hr tablet Take 0.5 tablets (12.5 mg total) by mouth daily.   metroNIDAZOLE  (FLAGYL ) 500 MG tablet Take 1 tablet (500 mg total) by mouth 2 (two) times daily.   nitroGLYCERIN  (NITROSTAT ) 0.4 MG SL tablet Place 1 tablet (0.4 mg total) under the tongue every 5 (five) minutes x 3 doses as needed for chest pain (if no relief after 3rd dose, proceed to the ED for an evaluation).   potassium chloride  SA (KLOR-CON  M) 20 MEQ tablet Take 2 tablets (40 mEq total) by mouth 2 (two) times daily. Until rechecked and told otherwise.   rivaroxaban  (XARELTO ) 20 MG TABS tablet TAKE 1 TABLET BY MOUTH ONCE DAILY WITH SUPPER   simvastatin  (ZOCOR ) 20 MG tablet Take 1 tablet by mouth once daily   Spacer/Aero-Holding Chambers (AEROCHAMBER PLUS) Device To be used as directed with inhaler for better technique and greater benefit   sucralfate  (CARAFATE ) 1 g tablet Take 1 tablet (1 g total) by mouth 4 (four) times daily -  with meals and at bedtime.   traMADol  (ULTRAM ) 50 MG tablet Take 1 tablet (50 mg total) by mouth every 12 (twelve) hours as needed.   vitamin B-12 (CYANOCOBALAMIN ) 50 MCG tablet Take 50 mcg by mouth daily.   VITAMIN D  PO Take 1 tablet by mouth daily.   Vonoprazan Fumarate  (VOQUEZNA ) 20 MG TABS Take 1 tablet by mouth daily.

## 2024-07-18 NOTE — ED Triage Notes (Signed)
 Pt comes by EMS for CP and SOB. Pt has hx of a-fib and the pt felt his heart go out of rhythm. Pt was sitting on the couch when this happened. Pt does not currently take anything for a-fib.    Pt has issues with absorbing K+  HR 60-82 with a-fib BP 112/73 EMS gave 324 mg of aspirin  and 1 nitroglycerin  tab

## 2024-07-18 NOTE — Discharge Instructions (Signed)
 Increase your potassium so you are taking 3 pills a day for 1 week and then go back to 2 pills.  Take an extra Toprol  if you have an episode where you feel like your heart is beating really fast.  If it does not get better you should return to the emergency department.  Contact your cardiologist next week and let them know what happened.  Return if any problem

## 2024-07-18 NOTE — Patient Instructions (Signed)
 Cervical Strain and Sprain Rehab You have pain and stiffness in your neck.  The muscles around your neck are irritated.  Recommend using heat (heating pad, or hot water in the shower) to warm up the affected muscles.  Then proceed with stretching and strengthening.  Do not stretch until it hurts, but you should feel a pull.  With each exercise, you should be able to stretch a little bit further.  Attempting these exercises daily, or on a regular basis, can improve your symptoms over time.  Do not expect immediate, sustained improvement.  But, it will make your symptoms better over time.   Ask your health care provider which exercises are safe for you. Do exercises exactly as told by your health care provider and adjust them as directed. It is normal to feel mild stretching, pulling, tightness, or discomfort as you do these exercises. Stop right away if you feel sudden pain or your pain gets worse. Do not begin these exercises until told by your health care provider. Stretching and range-of-motion exercises Cervical side bending  Using good posture, sit on a stable chair or stand up. Without moving your shoulders, slowly tilt your left / right ear to your shoulder until you feel a stretch in the opposite side neck muscles. You should be looking straight ahead. Hold for 10 seconds. Repeat with the other side of your neck. Repeat 10 times. Complete this exercise 1-2 times a day. Cervical rotation  Using good posture, sit on a stable chair or stand up. Slowly turn your head to the side as if you are looking over your left / right shoulder. Keep your eyes level with the ground. Stop when you feel a stretch along the side and the back of your neck. Hold for 10 seconds. Repeat this by turning to your other side. Repeat 10 times. Complete this exercise 1-2 times a day. Thoracic extension and pectoral stretch Roll a towel or a small blanket so it is about 4 inches (10 cm) in diameter. Lie down on  your back on a firm surface. Put the towel lengthwise, under your spine in the middle of your back. It should not be under your shoulder blades. The towel should line up with your spine from your middle back to your lower back. Put your hands behind your head and let your elbows fall out to your sides. Hold for 10 seconds. Repeat 10 times. Complete this exercise 1-2 times a day. Strengthening exercises Isometric upper cervical flexion Lie on your back with a thin pillow behind your head and a small rolled-up towel under your neck. Gently tuck your chin toward your chest and nod your head down to look toward your feet. Do not lift your head off the pillow. Hold for 10 seconds. Release the tension slowly. Relax your neck muscles completely before you repeat this exercise. Repeat 10 times. Complete this exercise 1-2 times a day. Isometric cervical extension  Stand about 6 inches (15 cm) away from a wall, with your back facing the wall. Place a soft object, about 6-8 inches (15-20 cm) in diameter, between the back of your head and the wall. A soft object could be a small pillow, a ball, or a folded towel. Gently tilt your head back and press into the soft object. Keep your jaw and forehead relaxed. Hold for 10 seconds. Release the tension slowly. Relax your neck muscles completely before you repeat this exercise. Repeat 10 times. Complete this exercise 1-2 times a day. Posture  and body mechanics Body mechanics refers to the movements and positions of your body while you do your daily activities. Posture is part of body mechanics. Good posture and healthy body mechanics can help to relieve stress in your body's tissues and joints. Good posture means that your spine is in its natural S-curve position (your spine is neutral), your shoulders are pulled back slightly, and your head is not tipped forward. The following are general guidelines for applying improved posture and body mechanics to your  everyday activities. Sitting  When sitting, keep your spine neutral and keep your feet flat on the floor. Use a footrest, if necessary, and keep your thighs parallel to the floor. Avoid rounding your shoulders, and avoid tilting your head forward. When working at a desk or a computer, keep your desk at a height where your hands are slightly lower than your elbows. Slide your chair under your desk so you are close enough to maintain good posture. When working at a computer, place your monitor at a height where you are looking straight ahead and you do not have to tilt your head forward or downward to look at the screen. Standing  When standing, keep your spine neutral and keep your feet about hip-width apart. Keep a slight bend in your knees. Your ears, shoulders, and hips should line up. When you do a task in which you stand in one place for a long time, place one foot up on a stable object that is 2-4 inches (5-10 cm) high, such as a footstool. This helps keep your spine neutral. Resting When lying down and resting, avoid positions that are most painful for you. Try to support your neck in a neutral position. You can use a contour pillow or a small rolled-up towel. Your pillow should support your neck but not push on it. This information is not intended to replace advice given to you by your health care provider. Make sure you discuss any questions you have with your health care provider. Document Revised: 10/23/2018 Document Reviewed: 04/03/2018 Elsevier Patient Education  2022 [employer].

## 2024-07-18 NOTE — ED Provider Notes (Signed)
 " [place] EMERGENCY DEPARTMENT AT [facility] Provider Note   CSN: [id number] Arrival date & time: 07/18/24  1850     Patient presents with: No chief complaint on file.   Richard [name] is a 59 y.o. male.   Patient with paroxysmal atrial fibs.  He has recently been started on Toprol -XL.  He stated that he had 2 episodes where his heart was beating fast and he had some tightness.  Patient feels fine now  The history is provided by the patient and medical records. No language interpreter was used.  Palpitations Palpitations quality:  Irregular Onset quality:  Sudden Timing:  Intermittent Progression:  Resolved Chronicity:  Recurrent Context: not anxiety   Relieved by:  Nothing Worsened by:  Nothing Associated symptoms: no back pain, no chest pain and no cough   Risk factors: no diabetes mellitus        Prior to Admission medications  Medication Sig Start Date End Date Taking? Authorizing Provider  acetaminophen  (TYLENOL ) 325 MG tablet Take 650 mg by mouth every 6 (six) hours as needed for moderate pain (pain score 4-6).    [provider]  albuterol  (VENTOLIN  HFA) 108 (90 Base) MCG/ACT inhaler INHALE 2 PUFFS BY MOUTH EVERY 6 HOURS AS NEEDED FOR COUGHING, WHEEZING, OR SHORTNESS OF BREATH 05/26/24   Ruthell Lauraine FALCON, NP  ALPRAZolam  (XANAX ) 0.5 MG tablet Take one twice a day and two at bedtime 05/16/24   Okey Barnie SAUNDERS, MD  amLODipine  (NORVASC ) 10 MG tablet Take 1 tablet (10 mg total) by mouth daily. 02/03/24   Zhao, Xika, NP  azithromycin  (ZITHROMAX ) 250 MG tablet Take 1 tablet (250 mg total) by mouth daily. Take first 2 tablets together, then 1 every day until finished. 06/29/24   Raford Lenis, MD  azithromycin  (ZITHROMAX ) 250 MG tablet Take first 2 tablets together, then 1 every day until finished. 07/08/24   Stuart Vernell Norris, PA-C  budesonide -formoterol  (SYMBICORT ) 160-4.5 MCG/ACT inhaler Take 2 puffs first thing in am and then another 2 puffs about 12  hours later. 01/14/24   Darlean Ozell NOVAK, MD  chlorhexidine  (PERIDEX ) 0.12 % solution Use as directed 15 mLs in the mouth or throat 2 (two) times daily. 05/30/24   Stuart Vernell Norris, PA-C  EPINEPHRINE  0.3 mg/0.3 mL IJ SOAJ injection INJECT 0.3 MG INTO THE SKIN AS NEEDED FOR ANAPHYLAXIS 07/07/24   Tobie Arleta SQUIBB, MD  esomeprazole  (NEXIUM ) 40 MG capsule Take 1 capsule (40 mg total) by mouth 2 (two) times daily before a meal. 04/08/24   Shirlean Therisa ORN, NP  lidocaine  (XYLOCAINE ) 2 % solution Use as directed 15 mLs in the mouth or throat every 3 (three) hours as needed for mouth pain. 01/15/24   Chandra Harlene LABOR, NP  lidocaine  (XYLOCAINE ) 2 % solution Use as directed 10 mLs in the mouth or throat every 3 (three) hours as needed. 05/30/24   Stuart Vernell Norris, PA-C  linaclotide  (LINZESS ) 72 MCG capsule Take 1 capsule (72 mcg total) by mouth daily before breakfast for 12 days. 05/12/24 07/18/24  Ezzard Sonny RAMAN, PA-C  methocarbamol  (ROBAXIN ) 500 MG tablet Take 1 tablet (500 mg total) by mouth 3 (three) times daily. 07/18/24   Onesimo Oneil LABOR, MD  metoprolol  succinate (TOPROL  XL) 25 MG 24 hr tablet Take 0.5 tablets (12.5 mg total) by mouth daily. 07/16/24   Debera Jayson MATSU, MD  metroNIDAZOLE  (FLAGYL ) 500 MG tablet Take 1 tablet (500 mg total) by mouth 2 (two) times daily. 07/08/24  Stuart Vernell Norris, PA-C  nitroGLYCERIN  (NITROSTAT ) 0.4 MG SL tablet Place 1 tablet (0.4 mg total) under the tongue every 5 (five) minutes x 3 doses as needed for chest pain (if no relief after 3rd dose, proceed to the ED for an evaluation). 06/29/24   Raford Lenis, MD  potassium chloride  SA (KLOR-CON  M) 20 MEQ tablet Take 2 tablets (40 mEq total) by mouth 2 (two) times daily. Until rechecked and told otherwise. 07/15/24   Mesner, Selinda, MD  rivaroxaban  (XARELTO ) 20 MG TABS tablet TAKE 1 TABLET BY MOUTH ONCE DAILY WITH SUPPER 04/18/24   Debera Jayson MATSU, MD  simvastatin  (ZOCOR ) 20 MG tablet Take 1 tablet by mouth once daily  06/22/23   Waddell Danelle ORN, MD  Spacer/Aero-Holding Chambers (AEROCHAMBER PLUS) Device To be used as directed with inhaler for better technique and greater benefit 05/13/24   Ruthell Lauraine FALCON, NP  sucralfate  (CARAFATE ) 1 g tablet Take 1 tablet (1 g total) by mouth 4 (four) times daily -  with meals and at bedtime. 06/24/24   Tobie Suzzane POUR, MD  traMADol  (ULTRAM ) 50 MG tablet Take 1 tablet (50 mg total) by mouth every 12 (twelve) hours as needed. 04/22/24   Leath-Warren, Etta PARAS, NP  vitamin B-12 (CYANOCOBALAMIN ) 50 MCG tablet Take 50 mcg by mouth daily.    [provider]  VITAMIN D  PO Take 1 tablet by mouth daily.    [provider]  Vonoprazan Fumarate  (VOQUEZNA ) 20 MG TABS Take 1 tablet by mouth daily. 06/26/24   Shirlean Therisa ORN, NP    Allergies: Alpha-gal, Dexilant  [dexlansoprazole ], Mushroom ext cmplx(shiitake-reishi-mait), Penicillins, Clindamycin /lincomycin, and Doxycycline    Review of Systems  Constitutional:  Negative for appetite change and fatigue.  HENT:  Negative for congestion, ear discharge and sinus pressure.   Eyes:  Negative for discharge.  Respiratory:  Negative for cough.   Cardiovascular:  Positive for palpitations. Negative for chest pain.  Gastrointestinal:  Negative for abdominal pain and diarrhea.  Genitourinary:  Negative for frequency and hematuria.  Musculoskeletal:  Negative for back pain.  Skin:  Negative for rash.  Neurological:  Negative for seizures and headaches.  Psychiatric/Behavioral:  Negative for hallucinations.     Updated Vital Signs BP 113/70   Pulse (!) 54   Resp 17   SpO2 97%   Physical Exam Vitals and nursing note reviewed.  Constitutional:      Appearance: He is well-developed.  HENT:     Head: Normocephalic.     Nose: Nose normal.  Eyes:     General: No scleral icterus.    Conjunctiva/sclera: Conjunctivae normal.  Neck:     Thyroid : No thyromegaly.  Cardiovascular:     Rate and Rhythm: Normal rate and regular  rhythm.     Heart sounds: No murmur heard.    No friction rub. No gallop.  Pulmonary:     Breath sounds: No stridor. No wheezing or rales.  Chest:     Chest wall: No tenderness.  Abdominal:     General: There is no distension.     Tenderness: There is no abdominal tenderness. There is no rebound.  Musculoskeletal:        General: Normal range of motion.     Cervical back: Neck supple.  Lymphadenopathy:     Cervical: No cervical adenopathy.  Skin:    Findings: No erythema or rash.  Neurological:     Mental Status: He is alert and oriented to person, place, and time.  Motor: No abnormal muscle tone.     Coordination: Coordination normal.  Psychiatric:        Behavior: Behavior normal.     (all labs ordered are listed, but only abnormal results are displayed) Labs Reviewed  COMPREHENSIVE METABOLIC PANEL WITH GFR - Abnormal; Notable for the following components:      Result Value   CO2 21 (*)    Glucose, Bld 124 (*)    AST 14 (*)    Anion gap 16 (*)    All other components within normal limits  CBC WITH DIFFERENTIAL/PLATELET  MAGNESIUM   TROPONIN T, HIGH SENSITIVITY    EKG: None  Radiology: [facility] Chest Port 1 View Result Date: 07/18/2024 EXAM: 1 VIEW(S) XRAY OF THE CHEST 07/18/2024 08:05:15 PM COMPARISON: 07/14/2024 CLINICAL HISTORY: Chest pain. FINDINGS: LUNGS AND PLEURA: No focal pulmonary opacity. No pleural effusion. No pneumothorax. HEART AND MEDIASTINUM: No acute abnormality of the cardiac and mediastinal silhouettes. BONES AND SOFT TISSUES: No acute osseous abnormality. IMPRESSION: 1. No active cardiopulmonary disease. Electronically signed by: Franky Crease MD 07/18/2024 08:15 PM EST RP Workstation: [id number]   DG Cervical Spine 2 or 3 views Result Date: 07/18/2024 X-rays of the cervical spine were obtained in clinic today.  No acute injuries are noted.  Slight loss of normal curvature.  There is slight loss of disc height at C4-5 and C5-6.  No anterolisthesis.  Small  osteophytes are appreciated.  No bony lesions.  Impression: Cervical spondylosis     Procedures   Medications Ordered in the ED  potassium chloride  SA (KLOR-CON  M) CR tablet 40 mEq (40 mEq Oral Given 07/18/24 2157)                                    Medical Decision Making Amount and/or Complexity of Data Reviewed Labs: ordered.  Risk Prescription drug management.   Paroxysmal atrial fibrillation.  Patient was told to take an extra Toprol  if he has an episode and to contact his cardiologist the beginning of the week     Final diagnoses:  Palpitation    ED Discharge Orders     None          Suzette Pac, MD 07/20/24 1618  "

## 2024-07-18 NOTE — Pre-Procedure Instructions (Signed)
 Spoke with patient via telephone. He states his potassium has been dropping and they aren't sure why, which is causing him to go into AFIB. Dr Cindie messaged to see if patient needs repeat potassium morning of procedure.

## 2024-07-18 NOTE — Progress Notes (Signed)
" °   07/18/24 1356  OBSTRUCTIVE SLEEP APNEA  Have you ever been diagnosed with sleep apnea through a sleep study? No  Do you snore loudly (loud enough to be heard through closed doors)?  1  Do you often feel tired, fatigued, or sleepy during the daytime (such as falling asleep during driving or talking to someone)? 0  Has anyone observed you stop breathing during your sleep? 1  Do you have, or are you being treated for high blood pressure? 1  BMI more than 35 kg/m2? 0  Age > 50 (1-yes) 1  Neck circumference greater than:Male 16 inches or larger, Male 17inches or larger? 0  Male Gender (Yes=1) 1  Obstructive Sleep Apnea Score 5  Score 5 or greater  Results sent to PCP    "

## 2024-07-19 ENCOUNTER — Emergency Department (HOSPITAL_COMMUNITY)
Admission: EM | Admit: 2024-07-19 | Discharge: 2024-07-19 | Disposition: A | Discharge status: Home / Self Care | Attending: Emergency Medicine | Admitting: Emergency Medicine

## 2024-07-19 ENCOUNTER — Telehealth: Payer: Self-pay | Admitting: Student in an Organized Health Care Education/Training Program

## 2024-07-19 ENCOUNTER — Encounter (HOSPITAL_COMMUNITY): Payer: Self-pay | Admitting: Emergency Medicine

## 2024-07-19 DIAGNOSIS — R079 Chest pain, unspecified: Secondary | ICD-10-CM | POA: Diagnosis present

## 2024-07-19 DIAGNOSIS — I480 Paroxysmal atrial fibrillation: Secondary | ICD-10-CM | POA: Insufficient documentation

## 2024-07-19 DIAGNOSIS — Z7901 Long term (current) use of anticoagulants: Secondary | ICD-10-CM | POA: Diagnosis not present

## 2024-07-19 LAB — BASIC METABOLIC PANEL WITH GFR
Anion gap: 11 (ref 5–15)
BUN: 18 mg/dL (ref 6–20)
CO2: 26 mmol/L (ref 22–32)
Calcium: 9.5 mg/dL (ref 8.9–10.3)
Chloride: 102 mmol/L (ref 98–111)
Creatinine, Ser: 0.77 mg/dL (ref 0.61–1.24)
GFR, Estimated: >60 mL/min
Glucose, Bld: 119 mg/dL — ABNORMAL HIGH (ref 70–99)
Potassium: 4 mmol/L (ref 3.5–5.1)
Sodium: 139 mmol/L (ref 135–145)

## 2024-07-19 MED ORDER — AMIODARONE HCL 200 MG PO TABS: ORAL_TABLET | ORAL | 0 refills | Status: AC

## 2024-07-19 NOTE — Telephone Encounter (Signed)
 Called Mr. Gamero today after reviewing his chart and he has had another ED evaluation for AF.  He thinks that his episodes are triggered by low potassium despite potassium supplementation is often 3.6.  My main concern is he has had significant weight loss over the past 2 years which he says is over 60 pounds.  He is an active smoker.  Additionally he has lung nodules which have been followed and were not PET active.  With his history though I am concerned for a malignancy.  Since his AF seems to be leading to recurrent hospitalizations we discussed different options for management.  He has tried flecainide  and Multaq  in the past.  We can discuss Tikosyn at our appointment as his QTc is reasonable but for now we will just try to keep him out of the hospital.  We briefly discussed ablation however need further evaluation of his lung nodules.  Although it would be rare he could have a paraneoplastic syndrome leading to excess ACTH if SCLC which could also cause his muscle weakness fatigue and arrhythmias.   We talked about side effects related to amiodarone  including acute and chronic damage to the lung, eyes, liver and thyroid .  Despite these in the short term I think that this would be reasonable to avoid recurrent ED visits.  Prescribed Amio for 100 mg twice daily for 2 weeks followed by 200 mg daily thereafter.

## 2024-07-19 NOTE — ED Triage Notes (Signed)
 Pt arrived via [facility] c/o palpitations, Hx of A fib that causes him chest pain, was seen last night for same, was given potassium supplement and discharged

## 2024-07-19 NOTE — ED Provider Notes (Signed)
 " [place] EMERGENCY DEPARTMENT AT [facility] Provider Note   CSN: [id number] Arrival date & time: 07/19/24  1335     Patient presents with: Chest Pain   Richard [name] is a 59 y.o. male.    Chest Pain  This patient is a 59 year old male, he is treated for atrial fibrillation by Dr. Debera in the cardiology service and is on medications including Toprol -XL which he was switched to from metoprolol  to tartrate recently.  The patient is also taking recent Zithromax  for possible upper respiratory illnesses.  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  The patient has been seen multiple times in the last couple of months for complaints of chest pain and palpitations, most recently was yesterday when he was seen for the same illness where he describes a feeling of palpitations and heart racing that comes and goes, he is asymptomatic at this time and the paramedics noted normal vital signs.  The patient denies fevers chills nausea vomiting or diarrhea.  He states that he occasionally feels weak when he tries to ambulate and is perseverating on the thought that this is related to low potassium or magnesium  and states he wants to be admitted to find out why his potassium is still low.  Yesterday his potassium was 3.6, magnesium  was 1.9, denies any other recent over-the-counter medication use including stimulants such as Sudafed or antihistamines.    Prior to Admission medications  Medication Sig Start Date End Date Taking? Authorizing Provider  acetaminophen  (TYLENOL ) 325 MG tablet Take 650 mg by mouth every 6 (six) hours as needed for moderate pain (pain score 4-6).    [provider]   albuterol  (VENTOLIN  HFA) 108 (90 Base) MCG/ACT inhaler INHALE 2 PUFFS BY MOUTH EVERY 6 HOURS AS NEEDED FOR COUGHING, WHEEZING, OR SHORTNESS OF BREATH 05/26/24   Ruthell Lauraine FALCON, NP  ALPRAZolam  (XANAX ) 0.5 MG tablet Take one twice a day and two at bedtime 05/16/24   Okey Barnie SAUNDERS, MD  amLODipine  (NORVASC ) 10 MG tablet Take 1 tablet (10 mg total) by mouth daily. 02/03/24   Zhao, Xika, NP  azithromycin  (ZITHROMAX ) 250 MG tablet Take 1 tablet (250 mg total) by mouth daily. Take first 2 tablets together, then 1 every day until finished. 06/29/24   Raford Lenis, MD  azithromycin  (ZITHROMAX ) 250 MG tablet Take first 2 tablets together, then 1 every day until finished. 07/08/24   Stuart Vernell Norris, PA-C  budesonide -formoterol  (SYMBICORT ) 160-4.5 MCG/ACT inhaler Take 2 puffs first thing in am and then another 2 puffs about 12 hours later. 01/14/24   Darlean Ozell NOVAK, MD  chlorhexidine  (PERIDEX ) 0.12 % solution Use as directed 15 mLs in the mouth or throat 2 (two) times daily. 05/30/24   Stuart Vernell Norris, PA-C  EPINEPHRINE  0.3 mg/0.3 mL IJ SOAJ injection INJECT 0.3 MG INTO THE SKIN AS NEEDED FOR ANAPHYLAXIS 07/07/24   Tobie Arleta SQUIBB, MD  esomeprazole  (NEXIUM ) 40 MG capsule Take 1 capsule (40 mg total) by mouth 2 (two) times daily before a meal. 04/08/24   Shirlean Therisa ORN, NP  lidocaine  (XYLOCAINE ) 2 % solution Use as directed 15 mLs in the mouth or throat every 3 (three) hours as needed for  mouth pain. 01/15/24   Chandra Harlene LABOR, NP  lidocaine  (XYLOCAINE ) 2 % solution Use as directed 10 mLs in the mouth or throat every 3 (three) hours as needed. 05/30/24   Stuart Vernell Norris, PA-C  linaclotide  (LINZESS ) 72 MCG capsule Take 1 capsule (72 mcg total) by mouth daily before breakfast for 12 days. 05/12/24 07/18/24  Ezzard Sonny RAMAN, PA-C  methocarbamol  (ROBAXIN ) 500 MG tablet Take 1 tablet (500 mg total) by mouth 3 (three) times daily. 07/18/24   Onesimo Oneil LABOR, MD  metoprolol  succinate (TOPROL  XL) 25 MG 24  hr tablet Take 0.5 tablets (12.5 mg total) by mouth daily. 07/16/24   Debera Jayson MATSU, MD  metroNIDAZOLE  (FLAGYL ) 500 MG tablet Take 1 tablet (500 mg total) by mouth 2 (two) times daily. 07/08/24   Stuart Vernell Norris, PA-C  nitroGLYCERIN  (NITROSTAT ) 0.4 MG SL tablet Place 1 tablet (0.4 mg total) under the tongue every 5 (five) minutes x 3 doses as needed for chest pain (if no relief after 3rd dose, proceed to the ED for an evaluation). 06/29/24   Raford Lenis, MD  potassium chloride  SA (KLOR-CON  M) 20 MEQ tablet Take 2 tablets (40 mEq total) by mouth 2 (two) times daily. Until rechecked and told otherwise. 07/15/24   Mesner, Selinda, MD  rivaroxaban  (XARELTO ) 20 MG TABS tablet TAKE 1 TABLET BY MOUTH ONCE DAILY WITH SUPPER 04/18/24   Debera Jayson MATSU, MD  simvastatin  (ZOCOR ) 20 MG tablet Take 1 tablet by mouth once daily 06/22/23   Waddell Danelle ORN, MD  Spacer/Aero-Holding Chambers (AEROCHAMBER PLUS) Device To be used as directed with inhaler for better technique and greater benefit 05/13/24   Ruthell Lauraine FALCON, NP  sucralfate  (CARAFATE ) 1 g tablet Take 1 tablet (1 g total) by mouth 4 (four) times daily -  with meals and at bedtime. 06/24/24   Tobie Suzzane POUR, MD  traMADol  (ULTRAM ) 50 MG tablet Take 1 tablet (50 mg total) by mouth every 12 (twelve) hours as needed. 04/22/24   Leath-Warren, Etta PARAS, NP  vitamin B-12 (CYANOCOBALAMIN ) 50 MCG tablet Take 50 mcg by mouth daily.    [provider]  VITAMIN D  PO Take 1 tablet by mouth daily.    [provider]  Vonoprazan Fumarate  (VOQUEZNA ) 20 MG TABS Take 1 tablet by mouth daily. 06/26/24   Shirlean Therisa ORN, NP    Allergies: Alpha-gal, Dexilant  [dexlansoprazole ], Mushroom ext cmplx(shiitake-reishi-mait), Penicillins, Clindamycin /lincomycin, and Doxycycline    Review of Systems  Cardiovascular:  Positive for chest pain.  All other systems reviewed and are negative.   Updated Vital Signs BP 113/69   Pulse (!) 58   Temp 98 F (36.7  C) (Oral)   Resp 13   SpO2 96%   Physical Exam Vitals and nursing note reviewed.  Constitutional:      General: He is not in acute distress.    Appearance: He is well-developed.  HENT:     Head: Normocephalic and atraumatic.     Mouth/Throat:     Pharynx: No oropharyngeal exudate.  Eyes:     General: No scleral icterus.       Right eye: No discharge.        Left eye: No discharge.     Conjunctiva/sclera: Conjunctivae normal.     Pupils: Pupils are equal, round, and reactive to light.  Neck:     Thyroid : No thyromegaly.     Vascular: No JVD.  Cardiovascular:     Rate and Rhythm: Normal rate and  regular rhythm.     Heart sounds: Normal heart sounds. No murmur heard.    No friction rub. No gallop.  Pulmonary:     Effort: Pulmonary effort is normal. No respiratory distress.     Breath sounds: Normal breath sounds. No wheezing or rales.  Abdominal:     General: Bowel sounds are normal. There is no distension.     Palpations: Abdomen is soft. There is no mass.     Tenderness: There is no abdominal tenderness.  Musculoskeletal:        General: No tenderness. Normal range of motion.     Cervical back: Normal range of motion and neck supple.  Lymphadenopathy:     Cervical: No cervical adenopathy.  Skin:    General: Skin is warm and dry.     Findings: No erythema or rash.  Neurological:     Mental Status: He is alert.     Coordination: Coordination normal.  Psychiatric:        Behavior: Behavior normal.     (all labs ordered are listed, but only abnormal results are displayed) Labs Reviewed  BASIC METABOLIC PANEL WITH GFR - Abnormal; Notable for the following components:      Result Value   Glucose, Bld 119 (*)    All other components within normal limits    EKG: EKG Interpretation Date/Time:  Saturday July 19 2024 13:50:36 EST Ventricular Rate:  59 PR Interval:  172 QRS Duration:  99 QT Interval:  400 QTC Calculation: 397 R Axis:   86  Text  Interpretation: Sinus rhythm Multiple ventricular premature complexes Consider left atrial enlargement Nonspecific T abnrm, anterolateral leads Confirmed by Cleotilde Rogue ([id number]) on 07/19/2024 2:01:52 PM  Radiology: [facility] Chest Port 1 View Result Date: 07/18/2024 EXAM: 1 VIEW(S) XRAY OF THE CHEST 07/18/2024 08:05:15 PM COMPARISON: 07/14/2024 CLINICAL HISTORY: Chest pain. FINDINGS: LUNGS AND PLEURA: No focal pulmonary opacity. No pleural effusion. No pneumothorax. HEART AND MEDIASTINUM: No acute abnormality of the cardiac and mediastinal silhouettes. BONES AND SOFT TISSUES: No acute osseous abnormality. IMPRESSION: 1. No active cardiopulmonary disease. Electronically signed by: Franky Crease MD 07/18/2024 08:15 PM EST RP Workstation: [id number]   DG Cervical Spine 2 or 3 views Result Date: 07/18/2024 X-rays of the cervical spine were obtained in clinic today.  No acute injuries are noted.  Slight loss of normal curvature.  There is slight loss of disc height at C4-5 and C5-6.  No anterolisthesis.  Small osteophytes are appreciated.  No bony lesions.  Impression: Cervical spondylosis     Procedures   Medications Ordered in the ED - No data to display                                  Medical Decision Making Amount and/or Complexity of Data Reviewed Labs: ordered.   I have reviewed the previous workups including x-rays including an x-ray from yesterday which was unremarkable, his lab work from yesterday which did not show any electrolyte abnormalities and prior EKGs.  His EKG here today shows that he has a sinus rhythm with occasional PACs, there is no signs of ischemia, no signs of atrial fibrillation.  Will proceed with rechecking labs, at this time the patient does not need any stabilizing medicines as he is in a normal sinus rhythm additionally he is not severely hypertensive nor is he febrile or hypoxic.  Potassium normal, workup unremarkable, patient given results, can follow-up  with cardiology,  normal sinus rhythm the entire time he has been here     Final diagnoses:  Paroxysmal atrial fibrillation [facility])    ED Discharge Orders     None          Cleotilde Rogue, MD 07/19/24 1519  "

## 2024-07-19 NOTE — Discharge Instructions (Signed)
 Thankfully your testing today did not reveal any specific abnormalities, your potassium was normal your heart rate is normal your blood pressure is normal.  I would recommend that you follow-up with the cardiologist office to discuss further options including a possible ablation if the cardiology teams think that this is a reasonable option.  Return to the ER for severe or worsening symptoms

## 2024-07-21 ENCOUNTER — Telehealth: Payer: Self-pay | Admitting: *Deleted

## 2024-07-21 NOTE — Telephone Encounter (Signed)
 Spoke to pt's wife Bari and she states pt is scheduled for procedure tomorrow and he has been in the ER off and on for Afib issues. What does he need to do?  Secure chat sent to provider.

## 2024-07-21 NOTE — Telephone Encounter (Signed)
 Message sent to endo to cancel procedure.   Per Dr.Carver: His most recent potassium 2 days ago was normal. Looks like his cardiologist just started him on amiodarone  2 days ago.  CC we may want to delay a few weeks until his afib is under better control.

## 2024-07-22 ENCOUNTER — Ambulatory Visit (HOSPITAL_COMMUNITY): Admission: RE | Admit: 2024-07-22 | Source: Home / Self Care | Admitting: Internal Medicine

## 2024-07-22 ENCOUNTER — Encounter (HOSPITAL_COMMUNITY): Admission: RE | Payer: Self-pay | Source: Home / Self Care

## 2024-07-22 SURGERY — EGD (ESOPHAGOGASTRODUODENOSCOPY)
Anesthesia: Choice

## 2024-07-23 ENCOUNTER — Telehealth: Payer: Self-pay | Admitting: Cardiology

## 2024-07-23 NOTE — Telephone Encounter (Signed)
 Spoke to pt's wife who verbalized understanding. Pt will d/c Metoprolol  and start Amiodarone  as recommended.

## 2024-07-23 NOTE — Telephone Encounter (Signed)
 FYI:  Returned the pt's wife call and was advised by her  that the pt's potassium is not absorbing as it should. I looked at the pt's labs and I see where the procedure for the pt was canceled. I advised her that his potassium was not flagged so it was normal. She advises that he is taking 3 potassium pills because his cardiologist changed it. I advised I cannot comment on another Dr's instructions. Advised that she needs to have the pt to contact the cardiologists with her concerns of the pt's A-fib and to keep appt her on Friday. Pt's wife expressed understanding.

## 2024-07-23 NOTE — Telephone Encounter (Signed)
 Gen Cards: poke to pt's wife who stated that pt reports hr has fallen into the 40's resting now that he has started Metoprolol  Succinate. Pt stated that with physical activity, heart rate will climb to the 60-70's and then fall again. Pt checks his heart rate daily but does not write readings down.    EP: Pt also reports that he has not started Amiodarone  as prescribed by EP provider as he is afraid that medication will bring his heart rate even lower. Pt stated that he would like reassurance from EP provider regarding medication.   I will route to both general cardiology and electrophysiology to address.   Please advise

## 2024-07-23 NOTE — Telephone Encounter (Signed)
 Pt c/o medication issue:  1. Name of Medication:   metoprolol  succinate (TOPROL  XL) 25 MG 24 hr tablet    2. How are you currently taking this medication (dosage and times per day)? As written  3. Are you having a reaction (difficulty breathing--STAT)? no  4. What is your medication issue? HR has been in the 40's since the medication change. Please advise.

## 2024-07-24 ENCOUNTER — Ambulatory Visit: Payer: Self-pay | Admitting: Physician Assistant

## 2024-07-24 LAB — BASIC METABOLIC PANEL WITH GFR
BUN/Creatinine Ratio: 18 (ref 9–20)
BUN: 13 mg/dL (ref 6–24)
CO2: 26 mmol/L (ref 20–29)
Calcium: 9.6 mg/dL (ref 8.7–10.2)
Chloride: 101 mmol/L (ref 96–106)
Creatinine, Ser: 0.72 mg/dL — ABNORMAL LOW (ref 0.76–1.27)
Glucose: 123 mg/dL — ABNORMAL HIGH (ref 70–99)
Potassium: 4.2 mmol/L (ref 3.5–5.2)
Sodium: 140 mmol/L (ref 134–144)
eGFR: 105 mL/min/1.73

## 2024-07-28 ENCOUNTER — Other Ambulatory Visit: Payer: Self-pay | Admitting: Home Health

## 2024-07-29 ENCOUNTER — Telehealth: Payer: Self-pay

## 2024-07-29 NOTE — Telephone Encounter (Signed)
 Patient scheduled.

## 2024-07-29 NOTE — Telephone Encounter (Signed)
 Copied from CRM #[id number]. Topic: Clinical - Lab/Test Results >> Jul 23, 2024 10:49 AM Joesph PARAS wrote: Reason for CRM: Patient's spouse is calling to request an order for a CT be placed for patient to re-check his lungs and ensure that infection he had previously has cleared up.   Attempted to advise that January CT was cancelled because screening was done 12/07 in hospital per cancellation note, neither patient not patient spouse voice understanding.  Pls advise on if pt needs another CT

## 2024-07-29 NOTE — Telephone Encounter (Signed)
 Pt will need OV to scd CT dr wert has not ordered one on pts so needs ov

## 2024-07-30 ENCOUNTER — Ambulatory Visit

## 2024-07-30 DIAGNOSIS — R0683 Snoring: Secondary | ICD-10-CM

## 2024-07-31 ENCOUNTER — Encounter: Payer: Self-pay | Admitting: Internal Medicine

## 2024-07-31 ENCOUNTER — Ambulatory Visit: Admitting: Internal Medicine

## 2024-07-31 VITALS — BP 126/72 | HR 75 | Ht 72.0 in | Wt 159.0 lb

## 2024-07-31 DIAGNOSIS — R058 Other specified cough: Secondary | ICD-10-CM

## 2024-07-31 DIAGNOSIS — F17210 Nicotine dependence, cigarettes, uncomplicated: Secondary | ICD-10-CM | POA: Diagnosis not present

## 2024-07-31 DIAGNOSIS — R0609 Other forms of dyspnea: Secondary | ICD-10-CM

## 2024-07-31 NOTE — Progress Notes (Signed)
 "  Subjective:    Patient ID: Richard [name], male    DOB: [birth year],    MRN: [id number]  HPI  34 yowm active smoker with onset of doe around 2011 much worse x 2015-16 to point where has trouble to walking  x 167ft slt uphill to MB  so referred to pulmonary clinic 11/17/2015 by Dr Catharine.   11/17/2015 1st  Pulmonary office visit/ Fedor Kazmierski  On advair 250 bid  Chief Complaint  Patient presents with   pulmonary consult    pt ref by dr. catharine for SOB. dry cough occ prod, wheezing mainly @ night, occ cp.   indolent onset doe x 6 years worse with certain smells and some better on advair/ventolin  as long as avoids exertion but  wheezing every night x one year disturbs sleep On nexium  bid but not ac Rec Plan A = Automatic = Symbicort  80 Take 2 puffs first thing in am and then another 2 puffs about 12 hours later.  Plan B = Backup Only use your albuterol (ventolin )  as a rescue medication  Stop lisinopril  and start valsartan  80 mg one daily in its place   nexium  Take 30- 60 min before your first and last meals of the day  GERD diet  Please schedule a follow up office visit in 4 weeks, sooner if needed > did not return as rec   03/14/2022  Re-establish ov/[place] office/Lakyla Biswas re: AB maint on saba prn   Chief Complaint  Patient presents with   Consult    Consult for pulm emphysema. Lung pain and SOB   Dyspnea:  several [employer]isles at [employer] variable speeds then gives out  Cough: varies during the day, not in AM/ just mucoid / powdered inhalers make it worse  Sleeping: bed has 2.5 inches or bad gerd  SABA use: up to 3 x days , never prechallenges 02: checks levels 20 min p arrives  Covid status: no vaccines, never infected  Lung cancer screening: had ct 09/2021 Rec Nexium  40 mg Take 30- 60 min before your first and last meals of the day  GERD diet reviewed, bed blocks rec  Breztri  one puff every 12 hours - if helps can try dulera  or symbiocrt thru patient assistance Only use your albuterol  as a  rescue medication  Ok to try albuterol  15 min before an activity (on alternating days)  that you know would usually make you short of breath     04/25/2022  f/u ov/[place] office/Breigh Annett re: doe  maint on nothing  / breztri  made him cough Chief Complaint  Patient presents with   Follow-up    Breathing has not changed since last ov   Dyspnea:  [employer] walking /5 squats / able to cross a half parking   Cough: clear phlegy Sleeping: slt elevation  SABA use: none on day of, up to twice daily as needed but does not pre-challenge or rechallenge as rec  02: none  Covid status: never 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  x 5 years > filed for disability  Rec Plan A = Automatic = Always=    Stiolto 2 puffs each am daily  Work on inhaler technique:   Plan B = Backup (to supplement plan A, not to replace it) Only use your albuterol  inhaler as a rescue medication Ok to try albuterol  15 min before an activity (on alternating days)  that you know would usually make you short of breath   Get rid of the mold and cigarettes  07/04/2023  f/u ov/[place] office/Chameka Mcmullen re: AB maint on symbicort  80  3-4 x per week in am / not typically in pm / and still smoking  Chief Complaint  Patient presents with   Shortness of Breath  Dyspnea:  can walk entire [employer] nl pace   Cough: variably  thick mostly white not using mucinex  Sleeping: bed blocks s resp cc s  am cough until stands  SABA use: not as much / did not bring it Rec For cough use mucinex dm 1200 mg every 12 hours as needed  Symbicort  80 up to 2 puffs every 12 hours as needed  Only use your albuterol  as a rescue medication   Also  Ok to try albuterol  15 min before an activity (on alternating days)  that you know would usually make you short of breath    PFT's  09/13/23 WNL    CT sinus  09/25/23   No air fluid levels  10/08/2023  f/u ov/[place] office/Dawnya Grams re: AB  maint on symbicort  80 2bid   Dyspnea:  no change ex tol  Cough: dark and thick  turned dark since last ov assoc with nasal congestion x sev months  Sleeping: bed blocks s  resp cc if smokes 1/2 cig prior to bed / doesn't cough until still  SABA use: occ  02: none  LCS : in program  Rec nasal steroids (flonase )  have no immediate benefit in terms of improving symptoms.     GERD diet reviewed, bed blocks rec  Please schedule a follow up visit in 3 months but call sooner if needed  with all medications /inhalers/ solutions in hand  - ENT consult  12/19/23 dx  chronic rhinitis Trial of Atrovent nasal spray 2 sprays per nostril 3 times daily-can increase to 4 times daily if needed or decrease to twice daily if too dry Will plan to recheck in 6 to 8 week    01/14/2024  f/u ov/[place] office/Mimie Goering re: AB maint on symbicort  80  did not  bring meds x for saba  Chief Complaint  Patient presents with   Follow-up   Cough  Dyspnea:  [employer] walking ok  Cough: more at home thick mucus x sev months  Sleeping: bed blocks resp cc ok congested in   4 am  SABA use: one a day around  02: none  Rec Change symbicort  to 160  Take 2 puffs first thing in am and then another 2 puffs about 12 hours later.   Only use your albuterol  as a rescue medication  Also  Ok to try albuterol  15 min before an activity (on alternating days)  that you know would usually make you short of breath The key is to stop smoking completely before smoking completely stops you!   07/31/2024  f/u ov/[place] office/Bobbiejo Ishikawa re: AB maint on symbicort  160  also being followed by Dr AL for OSA/MPNs - still smoking  Chief Complaint  Patient presents with   Cough    Follow up  wants ct scan   Dyspnea:  can't walk fast at [employer]  Cough:  none  Sleeping:  bed blocks / 1 pillow > occ   gasping for breath  > sleep w/u in progress   SABA use: no rescue 02: none   No obvious day to day or daytime variability or assoc excess/ purulent sputum or mucus plugs or hemoptysis or cp or chest tightness, subjective wheeze or  overt sinus or hb symptoms.    Also denies  any obvious fluctuation of symptoms with weather or environmental changes or other aggravating or alleviating factors except as outlined above   No unusual exposure hx or h/o childhood pna/ asthma or knowledge of premature birth.  Current Allergies, Complete Past Medical History, Past Surgical History, Family History, and Social History were reviewed in [employer] record.  ROS  The following are not active complaints unless bolded Hoarseness, sore throat, dysphagia, dental problems, itching, sneezing,  nasal congestion or discharge of excess mucus or purulent secretions, ear ache,   fever, chills, sweats, unintended wt loss or wt gain, classically pleuritic or exertional cp,  orthopnea pnd or arm/hand swelling  or leg swelling, presyncope, palpitations, abdominal pain, anorexia, nausea, vomiting, diarrhea  or change in bowel habits or change in bladder habits, change in stools or change in urine, dysuria, hematuria,  rash, arthralgias, visual complaints, headache, numbness, weakness or ataxia or problems with walking or coordination,  change in mood or  memory.         Outpatient Medications Prior to Visit  Medication Sig Dispense Refill   acetaminophen  (TYLENOL ) 325 MG tablet Take 650 mg by mouth every 6 (six) hours as needed for moderate pain (pain score 4-6).     albuterol  (VENTOLIN  HFA) 108 (90 Base) MCG/ACT inhaler INHALE 2 PUFFS BY MOUTH EVERY 6 HOURS AS NEEDED FOR COUGHING, WHEEZING, OR SHORTNESS OF BREATH 20.1 g 0   ALPRAZolam  (XANAX ) 0.5 MG tablet Take one twice a day and two at bedtime 120 tablet 2   amLODipine  (NORVASC ) 10 MG tablet TAKE 1 TABLET BY MOUTH EVERY DAY 90 tablet 2   budesonide -formoterol  (SYMBICORT ) 160-4.5 MCG/ACT inhaler Take 2 puffs first thing in am and then another 2 puffs about 12 hours later. 1 each 12   chlorhexidine  (PERIDEX ) 0.12 % solution Use as directed 15 mLs in the mouth or throat 2 (two)  times daily. 120 mL 0   EPINEPHRINE  0.3 mg/0.3 mL IJ SOAJ injection INJECT 0.3 MG INTO THE SKIN AS NEEDED FOR ANAPHYLAXIS 2 each 0   esomeprazole  (NEXIUM ) 40 MG capsule Take 1 capsule (40 mg total) by mouth 2 (two) times daily before a meal. 180 capsule 3   linaclotide  (LINZESS ) 72 MCG capsule Take 1 capsule (72 mcg total) by mouth daily before breakfast for 12 days. (Patient taking differently: Take 72 mcg by mouth as needed.)     metoprolol  succinate (TOPROL -XL) 12.5 mg TB24 24 hr tablet Take 12.5 mg by mouth daily.     nitroGLYCERIN  (NITROSTAT ) 0.4 MG SL tablet Place 1 tablet (0.4 mg total) under the tongue every 5 (five) minutes x 3 doses as needed for chest pain (if no relief after 3rd dose, proceed to the ED for an evaluation). 25 tablet 3   potassium chloride  SA (KLOR-CON  M) 20 MEQ tablet Take 2 tablets (40 mEq total) by mouth 2 (two) times daily. Until rechecked and told otherwise. 180 tablet 3   rivaroxaban  (XARELTO ) 20 MG TABS tablet TAKE 1 TABLET BY MOUTH ONCE DAILY WITH SUPPER 90 tablet 1   simvastatin  (ZOCOR ) 20 MG tablet Take 1 tablet by mouth once daily 90 tablet 3   Spacer/Aero-Holding Chambers (AEROCHAMBER PLUS) Device To be used as directed with inhaler for better technique and greater benefit 1 each 0   sucralfate  (CARAFATE ) 1 g tablet Take 1 tablet (1 g total) by mouth 4 (four) times daily -  with meals and at bedtime. 60 tablet 0   vitamin B-12 (CYANOCOBALAMIN ) 50 MCG tablet  Take 50 mcg by mouth daily.     VITAMIN D  PO Take 1 tablet by mouth daily.     [START ON 08/02/2024] amiodarone  (PACERONE ) 200 MG tablet Take 2 tablets (400 mg total) by mouth 2 (two) times daily for 14 days, THEN 1 tablet (200 mg total) daily. (Patient not taking: No sig reported) 132 tablet 0   azithromycin  (ZITHROMAX ) 250 MG tablet Take 1 tablet (250 mg total) by mouth daily. Take first 2 tablets together, then 1 every day until finished. (Patient not taking: Reported on 07/31/2024) 6 tablet 0   azithromycin   (ZITHROMAX ) 250 MG tablet Take first 2 tablets together, then 1 every day until finished. (Patient not taking: Reported on 07/31/2024) 6 tablet 0   lidocaine  (XYLOCAINE ) 2 % solution Use as directed 15 mLs in the mouth or throat every 3 (three) hours as needed for mouth pain. (Patient not taking: Reported on 07/31/2024) 100 mL 0   lidocaine  (XYLOCAINE ) 2 % solution Use as directed 10 mLs in the mouth or throat every 3 (three) hours as needed. (Patient not taking: Reported on 07/31/2024) 100 mL 0   methocarbamol  (ROBAXIN ) 500 MG tablet Take 1 tablet (500 mg total) by mouth 3 (three) times daily. (Patient not taking: Reported on 07/31/2024) 60 tablet 0   metroNIDAZOLE  (FLAGYL ) 500 MG tablet Take 1 tablet (500 mg total) by mouth 2 (two) times daily. (Patient not taking: Reported on 07/31/2024) 14 tablet 0   traMADol  (ULTRAM ) 50 MG tablet Take 1 tablet (50 mg total) by mouth every 12 (twelve) hours as needed. (Patient not taking: Reported on 07/31/2024) 6 tablet 0   Vonoprazan Fumarate  (VOQUEZNA ) 20 MG TABS Take 1 tablet by mouth daily. (Patient not taking: Reported on 07/31/2024) 30 tablet 5   No facility-administered medications prior to visit.           Objective:   Physical Exam   Wts  07/31/2024         159   01/14/2024         173 10/08/2023         175  07/04/2023       172  01/02/2023         170  04/25/2022      173  03/14/2022        175  11/17/15 207 lb 9.6 oz (94.167 kg)  10/14/15 205 lb 6.4 oz (93.169 kg)  10/07/15 204 lb (92.534 kg)    Vital signs reviewed  07/31/2024  - Note at rest 02 sats  98% on RA   General appearance:    amb wm nad    HEENT : Oropharynx  clear      Nasal turbinates nl    NECK :  without  apparent JVD/ palpable Nodes/TM    LUNGS: no acc muscle use,  Nl contour chest which is clear to A and P bilaterally without cough on insp or exp maneuvers   CV:  RRR  no s3 or murmur or increase in P2, and no edema   ABD:  soft and nontender   MS:  Gait nl   ext  warm without deformities Or obvious joint restrictions  calf tenderness, cyanosis or clubbing    SKIN: warm and dry without lesions    NEURO:  alert, approp, nl sensorium with  no motor or cerebellar deficits apparent.       Assessment & Plan:   Assessment & Plan DOE (dyspnea on exertion) Active smoker Spirometry 05/05/15  FEV1 3.47 (85%)  Ratio 74  - 11/17/2015   try symb 80 2bid and stop advair  - 03/14/2022   Walked on RA  x  3  lap(s) =  approx 450  ft  @ fast pace, stopped due to end of study with lowest 02 sats 97% min sob   - 03/14/2022  After extensive coaching inhaler device,  effectiveness =    80% HFA so try off advair dpi  And on symbicort  80 or dulera  100 2bid (breztri  sample given for teaching purposes  - 04/25/2022  try stiolto> caused cough  Chest LDSCT    12/20/22 Mild centrilobular and paraseptal emphysematous changes, upper lung predominant. - 01/02/2023  trial of symbicort  80 2bid prn - PFT's  09/13/23 WNL    FEV1 3.89 (98 % ) ratio 0.75  p 3 % improvement from saba p 0 prior to study with DLCO  29.57 (100%)   and FV curve nl   - 01/14/2024  After extensive coaching inhaler device,  effectiveness =    90% with increased need for saba so try symbicort  160 2bid and more approp saba  -  07/31/2024   Walked on RA  x  3  lap(s) =  approx 450  ft  @ mod pace, stopped due to end of study  with lowest 02 sats 94% and no palpitations/cp or sob    No findings to support doe from pulmonary source despite active smoking   Tells me he's been limited from exertion by cards due to fear of recurrent afib and may need to be placed on amiodarone  which makes it all the more important to so surveillance sats at highest level of aeobic activity he's achieving , even if he's some cardiac restrictions, so we can connect the dots and avoid amiodarone  toxicity which is best monitored with 02 sats at peak ex.   He his already also being followed by Dr Catherine so I will see the pt back prn dyspnea or  cough or sats declining with exertion.   Upper airway cough syndrome vs cough variant asthma Quit ACEi  11/2015  - intol of DPI's  Reported 03/14/2022  - max rx for GERD 03/14/2022 >>> improved as of 01/02/2023 > try adding symb 80  bid and if cough flares more likely this is UACS than asthma related > f/u with allergy  either way and here q 6 m, sooner prn  -  CT sinus  09/25/23  No air fluid levels> rx topically with afrin / flonase   - ENT consult  12/19/23 dx  chronic rhinitis Trial of Atrovent nasal spray 2 sprays per nostril 3 times daily-can increase to 4 times daily if needed or decrease to twice daily if too dry  Resolved to his satisfaction > f/u PRN  Cigarette smoker Counseled re importance of smoking cessation but did not meet time criteria for separate billing     Each maintenance medication was reviewed in detail including emphasizing most importantly the difference between maintenance and prns and under what circumstances the prns are to be triggered using an action plan format where appropriate.  Total time for H and P, chart review, counseling, reviewing hfa device(s) , directly observing portions of ambulatory 02 saturation study/ and generating customized AVS unique to this office visit / same day charting = 35 min summary final f/u                  AVS  Patient Instructions  Make sure  you check your oxygen saturation at your highest level of activity (NOT after you stop)  to be sure it stays over 90% and keep track of it at least once a week, more often if breathing getting worse, and let me know if losing ground. (Collect the dots to connect the dots approach)  - this will be very important to do if you do need to start amiodarone    Keep up appts with Dr Catherine - I will see you here as needed for difficulty breathing that's not obviously related to your heart (atrial fibrillation)          Ozell America, MD 07/31/2024  "

## 2024-07-31 NOTE — Assessment & Plan Note (Addendum)
 Counseled re importance of smoking cessation but did not meet time criteria for separate billing     Each maintenance medication was reviewed in detail including emphasizing most importantly the difference between maintenance and prns and under what circumstances the prns are to be triggered using an action plan format where appropriate.  Total time for H and P, chart review, counseling, reviewing hfa device(s) , directly observing portions of ambulatory 02 saturation study/ and generating customized AVS unique to this office visit / same day charting = 35 min summary final f/u

## 2024-07-31 NOTE — Assessment & Plan Note (Addendum)
 Active smoker Spirometry 05/05/15   FEV1 3.47 (85%)  Ratio 74  - 11/17/2015   try symb 80 2bid and stop advair  - 03/14/2022   Walked on RA  x  3  lap(s) =  approx 450  ft  @ fast pace, stopped due to end of study with lowest 02 sats 97% min sob   - 03/14/2022  After extensive coaching inhaler device,  effectiveness =    80% HFA so try off advair dpi  And on symbicort  80 or dulera  100 2bid (breztri  sample given for teaching purposes  - 04/25/2022  try stiolto> caused cough  Chest LDSCT    12/20/22 Mild centrilobular and paraseptal emphysematous changes, upper lung predominant. - 01/02/2023  trial of symbicort  80 2bid prn - PFT's  09/13/23 WNL    FEV1 3.89 (98 % ) ratio 0.75  p 3 % improvement from saba p 0 prior to study with DLCO  29.57 (100%)   and FV curve nl   - 01/14/2024  After extensive coaching inhaler device,  effectiveness =    90% with increased need for saba so try symbicort  160 2bid and more approp saba  -  07/31/2024   Walked on RA  x  3  lap(s) =  approx 450  ft  @ mod pace, stopped due to end of study  with lowest 02 sats 94% and no palpitations/cp or sob    No findings to support doe from pulmonary source despite active smoking   Tells me he's been limited from exertion by cards due to fear of recurrent afib and may need to be placed on amiodarone  which makes it all the more important to so surveillance sats at highest level of aeobic activity he's achieving , even if he's some cardiac restrictions, so we can connect the dots and avoid amiodarone  toxicity which is best monitored with 02 sats at peak ex.   He his already also being followed by Dr Catherine so I will see the pt back prn dyspnea or cough or sats declining with exertion.

## 2024-07-31 NOTE — Assessment & Plan Note (Addendum)
 Quit ACEi  11/2015  - intol of DPI's  Reported 03/14/2022  - max rx for GERD 03/14/2022 >>> improved as of 01/02/2023 > try adding symb 80  bid and if cough flares more likely this is UACS than asthma related > f/u with allergy  either way and here q 6 m, sooner prn  -  CT sinus  09/25/23  No air fluid levels> rx topically with afrin / flonase   - ENT consult  12/19/23 dx  chronic rhinitis Trial of Atrovent nasal spray 2 sprays per nostril 3 times daily-can increase to 4 times daily if needed or decrease to twice daily if too dry  Resolved to his satisfaction > f/u PRN

## 2024-07-31 NOTE — Patient Instructions (Addendum)
 Make sure you check your oxygen saturation at your highest level of activity (NOT after you stop)  to be sure it stays over 90% and keep track of it at least once a week, more often if breathing getting worse, and let me know if losing ground. (Collect the dots to connect the dots approach)  - this will be very important to do if you do need to start amiodarone    Keep up appts with Dr Catherine - I will see you here as needed for difficulty breathing that's not obviously related to your heart (atrial fibrillation)

## 2024-08-01 ENCOUNTER — Telehealth: Payer: Self-pay | Admitting: Family Medicine

## 2024-08-01 DIAGNOSIS — E876 Hypokalemia: Secondary | ICD-10-CM | POA: Diagnosis not present

## 2024-08-01 NOTE — Progress Notes (Signed)
 "  Virtual Visit via Video Note  I connected with Richard [name] on 08/01/24 at 10:00 AM EST by a video enabled telemedicine application and verified that I am speaking with the correct person using two identifiers.  Patient Location: Home Provider Location: Home Office  I discussed the limitations, risks, security, and privacy concerns of performing an evaluation and management service by video and the availability of in person appointments. I also discussed with the patient that there may be a patient responsible charge related to this service. The patient expressed understanding and agreed to proceed.  Subjective: PCP: Edman Meade PEDLAR, FNP  Chief Complaint  Patient presents with   Follow-up    ER follow up    HPI patient was recently seen in the ED on 13 2026 for paroxysmal atrial fibrillation. The imaging studies and labs were reassuring. The patient was discharged home encouraged to follow-up with cardiology. Since discharge the patient denies symptoms of chest pain palpitations and fatigue. No fever chills reported. He is concerned about his potassium level and will like it levels with its noting that low potassium level of pain triggered him into the patient.     ROS: Per HPI Current Medications[1]  Observations/Objective: There were no vitals filed for this visit. Physical Exam Patient is well-developed, well-nourished in no acute distress.  Resting comfortably at home.  Head is normocephalic, atraumatic.  No labored breathing.  Speech is clear and coherent with logical content.  Patient is alert and oriented at baseline.   Assessment and Plan: Hypokalemia -     BMP8+EGFR  Laboratory results and imaging studies were reviewed and discussed with the patient. The patient was encouraged to follow up with cardiology as scheduled and to continue the current treatment regimen as prescribed. The patient was advised to return in several weeks for repeat laboratory testing to  reassess potassium levels. The patient was encouraged to continue taking his oral potassium supplement as prescribed. The patient verbalized understanding and agrees with the plan.   Follow Up Instructions: No follow-ups on file.   I discussed the assessment and treatment plan with the patient. The patient was provided an opportunity to ask questions, and all were answered. The patient agreed with the plan and demonstrated an understanding of the instructions.   The patient was advised to call back or seek an in-person evaluation if the symptoms worsen or if the condition fails to improve as anticipated.  The above assessment and management plan was discussed with the patient. The patient verbalized understanding of and has agreed to the management plan.   Anas Reister  Z Bacchus, FNP     [1]  Current Outpatient Medications:    acetaminophen  (TYLENOL ) 325 MG tablet, Take 650 mg by mouth every 6 (six) hours as needed for moderate pain (pain score 4-6)., Disp: , Rfl:    albuterol  (VENTOLIN  HFA) 108 (90 Base) MCG/ACT inhaler, INHALE 2 PUFFS BY MOUTH EVERY 6 HOURS AS NEEDED FOR COUGHING, WHEEZING, OR SHORTNESS OF BREATH, Disp: 20.1 g, Rfl: 0   ALPRAZolam  (XANAX ) 0.5 MG tablet, Take one twice a day and two at bedtime, Disp: 120 tablet, Rfl: 2   amLODipine  (NORVASC ) 10 MG tablet, TAKE 1 TABLET BY MOUTH EVERY DAY, Disp: 90 tablet, Rfl: 2   budesonide -formoterol  (SYMBICORT ) 160-4.5 MCG/ACT inhaler, Take 2 puffs first thing in am and then another 2 puffs about 12 hours later., Disp: 1 each, Rfl: 12   chlorhexidine  (PERIDEX ) 0.12 % solution, Use as directed 15 mLs in  the mouth or throat 2 (two) times daily., Disp: 120 mL, Rfl: 0   EPINEPHRINE  0.3 mg/0.3 mL IJ SOAJ injection, INJECT 0.3 MG INTO THE SKIN AS NEEDED FOR ANAPHYLAXIS, Disp: 2 each, Rfl: 0   esomeprazole  (NEXIUM ) 40 MG capsule, Take 1 capsule (40 mg total) by mouth 2 (two) times daily before a meal., Disp: 180 capsule, Rfl: 3   linaclotide   (LINZESS ) 72 MCG capsule, Take 1 capsule (72 mcg total) by mouth daily before breakfast for 12 days. (Patient taking differently: Take 72 mcg by mouth as needed.), Disp: , Rfl:    nitroGLYCERIN  (NITROSTAT ) 0.4 MG SL tablet, Place 1 tablet (0.4 mg total) under the tongue every 5 (five) minutes x 3 doses as needed for chest pain (if no relief after 3rd dose, proceed to the ED for an evaluation)., Disp: 25 tablet, Rfl: 3   potassium chloride  SA (KLOR-CON  M) 20 MEQ tablet, Take 2 tablets (40 mEq total) by mouth 2 (two) times daily. Until rechecked and told otherwise., Disp: 180 tablet, Rfl: 3   rivaroxaban  (XARELTO ) 20 MG TABS tablet, TAKE 1 TABLET BY MOUTH ONCE DAILY WITH SUPPER, Disp: 90 tablet, Rfl: 1   simvastatin  (ZOCOR ) 20 MG tablet, Take 1 tablet by mouth once daily, Disp: 90 tablet, Rfl: 3   Spacer/Aero-Holding Chambers (AEROCHAMBER PLUS) Device, To be used as directed with inhaler for better technique and greater benefit, Disp: 1 each, Rfl: 0   sucralfate  (CARAFATE ) 1 g tablet, Take 1 tablet (1 g total) by mouth 4 (four) times daily -  with meals and at bedtime., Disp: 60 tablet, Rfl: 0   vitamin B-12 (CYANOCOBALAMIN ) 50 MCG tablet, Take 50 mcg by mouth daily., Disp: , Rfl:    VITAMIN D  PO, Take 1 tablet by mouth daily., Disp: , Rfl:    [START ON 08/02/2024] amiodarone  (PACERONE ) 200 MG tablet, Take 2 tablets (400 mg total) by mouth 2 (two) times daily for 14 days, THEN 1 tablet (200 mg total) daily. (Patient not taking: No sig reported), Disp: 132 tablet, Rfl: 0   azithromycin  (ZITHROMAX ) 250 MG tablet, Take 1 tablet (250 mg total) by mouth daily. Take first 2 tablets together, then 1 every day until finished. (Patient not taking: Reported on 07/31/2024), Disp: 6 tablet, Rfl: 0   azithromycin  (ZITHROMAX ) 250 MG tablet, Take first 2 tablets together, then 1 every day until finished. (Patient not taking: Reported on 07/31/2024), Disp: 6 tablet, Rfl: 0   lidocaine  (XYLOCAINE ) 2 % solution, Use as  directed 15 mLs in the mouth or throat every 3 (three) hours as needed for mouth pain. (Patient not taking: Reported on 07/31/2024), Disp: 100 mL, Rfl: 0   lidocaine  (XYLOCAINE ) 2 % solution, Use as directed 10 mLs in the mouth or throat every 3 (three) hours as needed. (Patient not taking: Reported on 07/31/2024), Disp: 100 mL, Rfl: 0   methocarbamol  (ROBAXIN ) 500 MG tablet, Take 1 tablet (500 mg total) by mouth 3 (three) times daily. (Patient not taking: Reported on 07/31/2024), Disp: 60 tablet, Rfl: 0   metoprolol  succinate (TOPROL -XL) 12.5 mg TB24 24 hr tablet, Take 12.5 mg by mouth daily., Disp: , Rfl:    metroNIDAZOLE  (FLAGYL ) 500 MG tablet, Take 1 tablet (500 mg total) by mouth 2 (two) times daily. (Patient not taking: Reported on 07/31/2024), Disp: 14 tablet, Rfl: 0   traMADol  (ULTRAM ) 50 MG tablet, Take 1 tablet (50 mg total) by mouth every 12 (twelve) hours as needed. (Patient not taking: Reported on 07/31/2024), Disp:  6 tablet, Rfl: 0   Vonoprazan Fumarate  (VOQUEZNA ) 20 MG TABS, Take 1 tablet by mouth daily. (Patient not taking: Reported on 07/31/2024), Disp: 30 tablet, Rfl: 5  "

## 2024-08-07 ENCOUNTER — Other Ambulatory Visit: Payer: Self-pay

## 2024-08-07 DIAGNOSIS — R0683 Snoring: Secondary | ICD-10-CM

## 2024-08-07 DIAGNOSIS — R0609 Other forms of dyspnea: Secondary | ICD-10-CM

## 2024-08-07 DIAGNOSIS — J449 Chronic obstructive pulmonary disease, unspecified: Secondary | ICD-10-CM

## 2024-08-07 NOTE — Telephone Encounter (Signed)
 Called and relayed recs, pt confirmed understanding

## 2024-08-07 NOTE — Telephone Encounter (Signed)
-----   Message from Paula Southerly, MD sent at 08/07/2024  2:59 PM EST ----- Regarding: RE: HST read. Thank you. Aliah Eriksson. Can you let the patient know he has severe OSA and that Dr. Theodoro recommends a CPAP titration. Please order it as well. ----- Message ----- From: Theodoro Lakes, MD Sent: 08/07/2024   2:47 PM EST To: Paula Southerly, MD Subject: HST read.                                      Hi Fahid,   Read her Sleep study. Has sever OSA with 65 min of desaturation. I would recommend doing CPAP titration study. The report will be scanned in soon. Just FYI.  Please let me know if I can help otherwise.   Rahul.

## 2024-08-08 ENCOUNTER — Telehealth: Payer: Self-pay | Admitting: Cardiology

## 2024-08-08 ENCOUNTER — Telehealth: Payer: Self-pay | Admitting: Pulmonary Disease

## 2024-08-08 LAB — BMP8+EGFR
BUN/Creatinine Ratio: 19 (ref 9–20)
BUN: 14 mg/dL (ref 6–24)
CO2: 25 mmol/L (ref 20–29)
Calcium: 9.6 mg/dL (ref 8.7–10.2)
Chloride: 101 mmol/L (ref 96–106)
Creatinine, Ser: 0.72 mg/dL — ABNORMAL LOW (ref 0.76–1.27)
Glucose: 109 mg/dL — ABNORMAL HIGH (ref 70–99)
Potassium: 4 mmol/L (ref 3.5–5.2)
Sodium: 143 mmol/L (ref 134–144)
eGFR: 105 mL/min/1.73

## 2024-08-08 NOTE — Telephone Encounter (Signed)
 Copied from CRM #[id number]. Topic: General - Call Back - No Documentation >> Aug 08, 2024  9:01 AM Joesph PARAS wrote: Reason for CRM: Patient's spouse is returning call, states received a call about doing another sleep study at the hospital. Patient is not able to stay overnight in a hospital per spouse. No documentation of call. Please advise patient.  Spoke with Mr. Kingsley earlier this morning and relayed this information to Dr. Catherine.  I am waiting for his response

## 2024-08-08 NOTE — Telephone Encounter (Signed)
 Pt c/o medication issue:  1. Name of Medication: metoprolol  succinate (TOPROL -XL) 12.5 mg TB24 24 hr tablet   2. How are you currently taking this medication (dosage and times per day)?  Take 12.5 mg by mouth daily.       3. Are you having a reaction (difficulty breathing--STAT)? No  4. What is your medication issue? Pt's wife is requesting a callback regarding wanting to know if it's ok to double up on this medication. Please advise

## 2024-08-08 NOTE — Telephone Encounter (Signed)
 Reports HR increased to 111 after feeding birds and climbing a two-step ladder.  Reports HR returned to normal after resting. Resting HR is currently 72. Currently taking Toprol  XL 12.5 mg.  Advised that he didn't need to take extra metoprolol  if his resting HR is normal. Advised to continue monitoring, and if his resting HR stays elevated, to contact our office. Verbalized understanding.

## 2024-08-09 DIAGNOSIS — G4733 Obstructive sleep apnea (adult) (pediatric): Secondary | ICD-10-CM | POA: Diagnosis not present

## 2024-08-11 ENCOUNTER — Ambulatory Visit (INDEPENDENT_AMBULATORY_CARE_PROVIDER_SITE_OTHER): Admitting: Clinical

## 2024-08-11 DIAGNOSIS — F419 Anxiety disorder, unspecified: Secondary | ICD-10-CM | POA: Diagnosis not present

## 2024-08-11 DIAGNOSIS — F4001 Agoraphobia with panic disorder: Secondary | ICD-10-CM

## 2024-08-11 DIAGNOSIS — F4310 Post-traumatic stress disorder, unspecified: Secondary | ICD-10-CM | POA: Diagnosis not present

## 2024-08-11 DIAGNOSIS — F331 Major depressive disorder, recurrent, moderate: Secondary | ICD-10-CM
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
## 2024-08-12 ENCOUNTER — Telehealth: Payer: Self-pay | Admitting: Urology

## 2024-08-12 ENCOUNTER — Other Ambulatory Visit: Payer: Self-pay

## 2024-08-12 ENCOUNTER — Telehealth: Payer: Self-pay | Admitting: Cardiology

## 2024-08-12 DIAGNOSIS — R300 Dysuria: Secondary | ICD-10-CM

## 2024-08-12 DIAGNOSIS — R0683 Snoring: Secondary | ICD-10-CM

## 2024-08-12 DIAGNOSIS — R350 Frequency of micturition: Secondary | ICD-10-CM

## 2024-08-12 MED ORDER — SIMVASTATIN 20 MG PO TABS: 20.0000 mg | ORAL_TABLET | Freq: Every day | ORAL | 3 refills | Status: AC

## 2024-08-12 NOTE — Telephone Encounter (Signed)
 Dysuria  Patient called with c/o dysuria x 2-3 days days.  Pain: soreness while voiding bubbles in urine Severity:3/10  Associated Signs and Symptoms:  Fever: no Chills: no Hematuria: no Urgency: yes Frequency: no Hesitancy:no Incontinence: no Nausea: no Vomiting: no  Urologic History:  Any Recent Urologic Surgeries or Procedures:no Recurrent UTI's:no Cystitis: no  Prostatitis:no Kidney or Bladder Stones: no Plan: Walk-in Clinic: no Appointment w/Physician: [no Lab visit scheduled for urine drop off: Yes Advice given:  Do you take on daily medications for UTI suppression No

## 2024-08-12 NOTE — Telephone Encounter (Signed)
 error

## 2024-08-12 NOTE — Telephone Encounter (Signed)
 Richard [name]

## 2024-08-12 NOTE — Telephone Encounter (Signed)
 Refill sent

## 2024-08-12 NOTE — Telephone Encounter (Signed)
" °*  STAT* If patient is at the pharmacy, call can be transferred to refill team.   1. Which medications need to be refilled? (please list name of each medication and dose if known)   simvastatin  (ZOCOR ) 20 MG tablet   Take 1 tablet by mouth once daily   2. Would you like to learn more about the convenience, safety, & potential cost savings by using the [facility]? No   3. Are you open to using the [facility] No   4. Which pharmacy/location (including street and city if local pharmacy) is medication to be sent to?90 Day Supply   5. Do they need a 30 day or 90 day supply? [id number] - [place], [place] - [address] AT [address]    Pt is currently out of medication. Has been off the medication a couple of days. "

## 2024-08-13 ENCOUNTER — Other Ambulatory Visit (HOSPITAL_COMMUNITY)

## 2024-08-13 ENCOUNTER — Other Ambulatory Visit

## 2024-08-13 ENCOUNTER — Ambulatory Visit: Admitting: Acute Care

## 2024-08-13 ENCOUNTER — Other Ambulatory Visit: Payer: Self-pay

## 2024-08-13 DIAGNOSIS — R300 Dysuria: Secondary | ICD-10-CM

## 2024-08-13 LAB — URINALYSIS, ROUTINE W REFLEX MICROSCOPIC
Bilirubin, UA: NEGATIVE
Glucose, UA: NEGATIVE
Ketones, UA: NEGATIVE
Leukocytes,UA: NEGATIVE
Nitrite, UA: NEGATIVE
Protein,UA: NEGATIVE
RBC, UA: NEGATIVE
Specific Gravity, UA: 1.01 (ref 1.005–1.030)
Urobilinogen, Ur: 0.2 mg/dL (ref 0.2–1.0)
pH, UA: 6 (ref 5.0–7.5)

## 2024-08-13 NOTE — Telephone Encounter (Signed)
 Patient presents today with complaints of  Dysuria.  UA and Culture done today.  Dr. Sherrilee reviewed results and No treatment  .  Patient aware of MD recommendations and that we will reach out with culture results.      Dyjwpvlj, CMA

## 2024-08-14 ENCOUNTER — Telehealth (HOSPITAL_COMMUNITY): Admitting: Psychiatry

## 2024-08-14 ENCOUNTER — Encounter (HOSPITAL_COMMUNITY): Payer: Self-pay | Admitting: Psychiatry

## 2024-08-14 DIAGNOSIS — F410 Panic disorder [episodic paroxysmal anxiety] without agoraphobia: Secondary | ICD-10-CM

## 2024-08-14 MED ORDER — ALPRAZOLAM 0.5 MG PO TABS: ORAL_TABLET | ORAL | 2 refills | Status: AC
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
## 2024-08-15 ENCOUNTER — Telehealth (HOSPITAL_COMMUNITY): Admitting: Psychiatry

## 2024-08-15 ENCOUNTER — Other Ambulatory Visit (HOSPITAL_COMMUNITY)

## 2024-08-18 ENCOUNTER — Ambulatory Visit: Admitting: Family Medicine

## 2024-08-18 ENCOUNTER — Ambulatory Visit: Admitting: Internal Medicine

## 2024-08-20 ENCOUNTER — Ambulatory Visit: Payer: Self-pay | Admitting: Pulmonary Disease

## 2024-08-20 NOTE — Progress Notes (Signed)
 Yes, if his CPAP is going to be set up, then yes reschedule 30 - 90 days after starting CPAP.

## 2024-08-25 ENCOUNTER — Ambulatory Visit: Admitting: Gastroenterology

## 2024-08-25 ENCOUNTER — Ambulatory Visit: Admitting: Pulmonary Disease

## 2024-08-26 ENCOUNTER — Ambulatory Visit: Admitting: Pulmonary Disease

## 2024-08-27 ENCOUNTER — Ambulatory Visit: Admitting: Pulmonary Disease

## 2024-08-27 ENCOUNTER — Ambulatory Visit: Admitting: Physician Assistant

## 2024-08-28 ENCOUNTER — Ambulatory Visit: Admitting: Gastroenterology

## 2024-08-29 ENCOUNTER — Ambulatory Visit: Admitting: Student

## 2024-09-04 ENCOUNTER — Ambulatory Visit (HOSPITAL_COMMUNITY): Admitting: Clinical

## 2024-09-22 ENCOUNTER — Ambulatory Visit: Admitting: Internal Medicine

## 2024-09-23 ENCOUNTER — Ambulatory Visit: Admitting: Family Medicine

## 2024-09-26 ENCOUNTER — Ambulatory Visit: Admitting: Student in an Organized Health Care Education/Training Program

## 2024-09-29 ENCOUNTER — Ambulatory Visit: Admitting: Family Medicine

## 2024-10-21 ENCOUNTER — Ambulatory Visit: Admitting: Pulmonary Disease

## 2025-01-06 ENCOUNTER — Ambulatory Visit: Admitting: Dermatology

## 2025-01-30 ENCOUNTER — Other Ambulatory Visit (HOSPITAL_COMMUNITY)

## 2025-02-06 ENCOUNTER — Ambulatory Visit: Admitting: Urology
